# Patient Record
Sex: Female | Born: 1935 | Race: White | Hispanic: No | Smoking: Never smoker
Health system: Southern US, Community
[De-identification: ages and names within clinical notes are randomized; demographics above are authoritative.]

## PROBLEM LIST (undated history)

## (undated) DIAGNOSIS — C801 Malignant (primary) neoplasm, unspecified: Secondary | ICD-10-CM

## (undated) DIAGNOSIS — N183 Chronic kidney disease, stage 3 unspecified: Secondary | ICD-10-CM

## (undated) DIAGNOSIS — I779 Disorder of arteries and arterioles, unspecified: Secondary | ICD-10-CM

## (undated) DIAGNOSIS — M1710 Unilateral primary osteoarthritis, unspecified knee: Secondary | ICD-10-CM

## (undated) DIAGNOSIS — I2510 Atherosclerotic heart disease of native coronary artery without angina pectoris: Secondary | ICD-10-CM

## (undated) DIAGNOSIS — N289 Disorder of kidney and ureter, unspecified: Secondary | ICD-10-CM

## (undated) DIAGNOSIS — I739 Peripheral vascular disease, unspecified: Secondary | ICD-10-CM

## (undated) DIAGNOSIS — R0609 Other forms of dyspnea: Secondary | ICD-10-CM

## (undated) DIAGNOSIS — E079 Disorder of thyroid, unspecified: Secondary | ICD-10-CM

## (undated) DIAGNOSIS — K449 Diaphragmatic hernia without obstruction or gangrene: Secondary | ICD-10-CM

## (undated) DIAGNOSIS — K922 Gastrointestinal hemorrhage, unspecified: Secondary | ICD-10-CM

## (undated) DIAGNOSIS — M179 Osteoarthritis of knee, unspecified: Secondary | ICD-10-CM

## (undated) DIAGNOSIS — R32 Unspecified urinary incontinence: Secondary | ICD-10-CM

## (undated) DIAGNOSIS — N951 Menopausal and female climacteric states: Secondary | ICD-10-CM

## (undated) DIAGNOSIS — E611 Iron deficiency: Secondary | ICD-10-CM

## (undated) DIAGNOSIS — R0600 Dyspnea, unspecified: Secondary | ICD-10-CM

## (undated) DIAGNOSIS — I5032 Chronic diastolic (congestive) heart failure: Secondary | ICD-10-CM

## (undated) DIAGNOSIS — I10 Essential (primary) hypertension: Secondary | ICD-10-CM

## (undated) HISTORY — DX: Gastrointestinal hemorrhage, unspecified: K92.2

## (undated) HISTORY — DX: Iron deficiency: E61.1

## (undated) HISTORY — DX: Disorder of arteries and arterioles, unspecified: I77.9

## (undated) HISTORY — DX: Morbid (severe) obesity due to excess calories: E66.01

## (undated) HISTORY — DX: Peripheral vascular disease, unspecified: I73.9

## (undated) HISTORY — PX: REPLACEMENT TOTAL KNEE BILATERAL: SUR1225

## (undated) HISTORY — DX: Essential (primary) hypertension: I10

## (undated) HISTORY — DX: Unilateral primary osteoarthritis, unspecified knee: M17.10

## (undated) HISTORY — DX: Chronic diastolic (congestive) heart failure: I50.32

## (undated) HISTORY — PX: TOTAL VAGINAL HYSTERECTOMY: SHX2548

## (undated) HISTORY — DX: Menopausal and female climacteric states: N95.1

## (undated) HISTORY — DX: Unspecified urinary incontinence: R32

## (undated) HISTORY — DX: Osteoarthritis of knee, unspecified: M17.9

## (undated) HISTORY — DX: Other forms of dyspnea: R06.09

## (undated) HISTORY — DX: Dyspnea, unspecified: R06.00

## (undated) HISTORY — DX: Atherosclerotic heart disease of native coronary artery without angina pectoris: I25.10

## (undated) HISTORY — DX: Diaphragmatic hernia without obstruction or gangrene: K44.9

## (undated) HISTORY — DX: Disorder of thyroid, unspecified: E07.9

---

## 2004-11-08 ENCOUNTER — Ambulatory Visit: Payer: Self-pay | Admitting: Internal Medicine

## 2005-11-11 ENCOUNTER — Ambulatory Visit: Payer: Self-pay | Admitting: Internal Medicine

## 2005-12-08 ENCOUNTER — Ambulatory Visit: Payer: Self-pay | Admitting: Unknown Physician Specialty

## 2006-10-29 ENCOUNTER — Ambulatory Visit: Payer: Self-pay | Admitting: Internal Medicine

## 2007-07-15 ENCOUNTER — Ambulatory Visit: Payer: Self-pay | Admitting: Internal Medicine

## 2007-11-02 ENCOUNTER — Ambulatory Visit: Payer: Self-pay | Admitting: Internal Medicine

## 2008-11-03 ENCOUNTER — Ambulatory Visit: Payer: Self-pay | Admitting: Internal Medicine

## 2009-05-07 ENCOUNTER — Encounter: Payer: Self-pay | Admitting: Cardiovascular Disease

## 2009-05-07 LAB — CONVERTED CEMR LAB
HDL: 29.2 mg/dL
LDL Cholesterol: 80 mg/dL
Triglyceride fasting, serum: 194 mg/dL

## 2009-06-22 ENCOUNTER — Inpatient Hospital Stay: Payer: Medicare Other | Admitting: Internal Medicine

## 2009-07-12 ENCOUNTER — Encounter: Payer: Self-pay | Admitting: Cardiovascular Disease

## 2009-07-12 DIAGNOSIS — E039 Hypothyroidism, unspecified: Secondary | ICD-10-CM | POA: Insufficient documentation

## 2009-07-12 DIAGNOSIS — I10 Essential (primary) hypertension: Secondary | ICD-10-CM

## 2009-07-12 DIAGNOSIS — E032 Hypothyroidism due to medicaments and other exogenous substances: Secondary | ICD-10-CM

## 2009-07-18 ENCOUNTER — Ambulatory Visit: Payer: Self-pay | Admitting: Cardiovascular Disease

## 2009-07-18 DIAGNOSIS — I2510 Atherosclerotic heart disease of native coronary artery without angina pectoris: Secondary | ICD-10-CM

## 2009-07-18 DIAGNOSIS — E785 Hyperlipidemia, unspecified: Secondary | ICD-10-CM

## 2009-07-31 ENCOUNTER — Telehealth: Payer: Self-pay | Admitting: Cardiovascular Disease

## 2009-08-07 ENCOUNTER — Encounter: Payer: Self-pay | Admitting: Cardiovascular Disease

## 2009-08-16 ENCOUNTER — Encounter: Payer: Medicare Other | Admitting: Cardiovascular Disease

## 2009-08-24 ENCOUNTER — Encounter: Payer: Medicare Other | Admitting: Cardiovascular Disease

## 2009-08-29 ENCOUNTER — Telehealth: Payer: Self-pay | Admitting: Cardiovascular Disease

## 2009-09-24 ENCOUNTER — Encounter: Payer: Medicare Other | Admitting: Cardiovascular Disease

## 2009-10-01 ENCOUNTER — Encounter: Payer: Self-pay | Admitting: Cardiovascular Disease

## 2009-10-01 ENCOUNTER — Telehealth: Payer: Self-pay | Admitting: Cardiovascular Disease

## 2009-10-11 ENCOUNTER — Telehealth: Payer: Self-pay | Admitting: Cardiovascular Disease

## 2009-10-18 ENCOUNTER — Ambulatory Visit: Payer: Self-pay | Admitting: Cardiovascular Disease

## 2009-10-22 ENCOUNTER — Encounter: Payer: Self-pay | Admitting: Cardiovascular Disease

## 2009-10-24 ENCOUNTER — Telehealth: Payer: Self-pay | Admitting: Cardiovascular Disease

## 2009-10-25 ENCOUNTER — Ambulatory Visit: Payer: Medicare Other | Admitting: Internal Medicine

## 2009-11-01 LAB — CONVERTED CEMR LAB
Bilirubin, Direct: 0.1 mg/dL (ref 0.0–0.3)
Cholesterol: 147 mg/dL (ref 0–200)
Indirect Bilirubin: 0.2 mg/dL (ref 0.0–0.9)
LDL Cholesterol: 80 mg/dL (ref 0–99)
Total Bilirubin: 0.3 mg/dL (ref 0.3–1.2)
Total CHOL/HDL Ratio: 3.7
VLDL: 27 mg/dL (ref 0–40)

## 2009-11-08 ENCOUNTER — Encounter: Payer: Self-pay | Admitting: Cardiovascular Disease

## 2009-11-09 ENCOUNTER — Encounter: Payer: Self-pay | Admitting: Cardiovascular Disease

## 2009-11-12 ENCOUNTER — Ambulatory Visit: Payer: Medicare Other | Admitting: Internal Medicine

## 2009-11-16 ENCOUNTER — Telehealth: Payer: Self-pay | Admitting: Cardiovascular Disease

## 2009-11-19 ENCOUNTER — Ambulatory Visit: Payer: Medicare Other | Admitting: Internal Medicine

## 2009-11-24 ENCOUNTER — Ambulatory Visit: Payer: Medicare Other | Admitting: Internal Medicine

## 2009-12-11 ENCOUNTER — Telehealth: Payer: Self-pay | Admitting: Cardiovascular Disease

## 2009-12-25 ENCOUNTER — Ambulatory Visit: Payer: Medicare Other | Admitting: Internal Medicine

## 2009-12-28 ENCOUNTER — Telehealth: Payer: Self-pay | Admitting: Cardiovascular Disease

## 2010-01-09 ENCOUNTER — Telehealth: Payer: Self-pay | Admitting: Cardiovascular Disease

## 2010-01-21 ENCOUNTER — Ambulatory Visit: Payer: Self-pay | Admitting: Cardiovascular Disease

## 2010-01-21 DIAGNOSIS — R0602 Shortness of breath: Secondary | ICD-10-CM

## 2010-01-24 ENCOUNTER — Ambulatory Visit: Payer: Medicare Other | Admitting: Internal Medicine

## 2010-02-01 ENCOUNTER — Telehealth: Payer: Self-pay | Admitting: Cardiovascular Disease

## 2010-02-04 ENCOUNTER — Telehealth: Payer: Self-pay | Admitting: Cardiovascular Disease

## 2010-02-05 ENCOUNTER — Encounter: Payer: Self-pay | Admitting: Cardiovascular Disease

## 2010-02-12 ENCOUNTER — Encounter: Payer: Self-pay | Admitting: Cardiovascular Disease

## 2010-02-24 ENCOUNTER — Ambulatory Visit: Payer: Medicare Other | Admitting: Internal Medicine

## 2010-03-08 ENCOUNTER — Encounter: Payer: Self-pay | Admitting: Cardiovascular Disease

## 2010-03-26 NOTE — Progress Notes (Signed)
Summary: PLAVIX  Phone Note Call from Patient Call back at Home Phone [phone]   Caller: SELF Call For: GOLLAN Summary of Call: PT REQUSTING PLAVIX-I STATED THAT WE DO NOT HAVE ANY-PT WAS NOT HAPPY AND REPLIED THAT WE HAVE NOT GIVEN HER ANY PLAVIX FOR 4 WEEKS-I TOLD HER THAT WE HAVE ALOT OF PTS THAT USE PLAVIX AND THAT IT IS DIFFICULT TO KEEP IT IN STOCK AND TOLD HER THAT SHE WOULD MOST LIKELY NEED TO START GETTING IT REFILLED-SHE STATED THAT DR GOLLAN TOLD HER THAT WE WOULD KEEP HER ON IT WITH SUPPLIES-SHE WANTED TO KNOW MY NAME AND SAID THAT SHE IS KEEPING A LIST OF EVERYONE HERE THAT TELLS HER THAT WE ARE OUT OF PLAVIX AND THAT SHE IS COMING FOR AN APPT WITH GOLLAN ON MONDAY AND WILL BE TALKING TO HIM ABOUT IT. Initial call taken by: Zenovia Jarred,  January 09, 2010 3:43 PM  Follow-up for Phone Call        Plavix only available in very limited amounts. We could probably get a free month of effient when coupons are available.

## 2010-03-26 NOTE — Miscellaneous (Signed)
Summary: Heart Track Order  Heart Track Order   Imported By: Zenovia Jarred 07/19/2009 12:50:10  _____________________________________________________________________  External Attachment:    Type:   Image     Comment:   External Document

## 2010-03-26 NOTE — Miscellaneous (Signed)
Summary: HEART TRACK  HEART TRACK   Imported By: Cathie Beams [place] 08/07/2009 13:36:54  _____________________________________________________________________  External Attachment:    Type:   Image     Comment:   External Document

## 2010-03-26 NOTE — Progress Notes (Signed)
Summary: SAMPLES given of Plavix  Phone Note Call from Patient Call back at [phone]   Summary of Call: Patient would like samples of Plavix. Please call if we have any. [id number] Initial call taken by: Shanda Howells,  August 29, 2009 11:51 AM  Follow-up for Phone Call        Samples given of Plavix 75 mg for 8 tabs. Told pt. to pick up. Follow-up by: Dolores Lory, CMA,  August 29, 2009 12:06 PM

## 2010-03-26 NOTE — Progress Notes (Signed)
Summary: refill plavix  Phone Note Call from Patient   Caller: Patient Summary of Call: Needs a refill on Plavix 75 mg.  Initial call taken by: Dolores Lory, CMA,  November 16, 2009 2:40 PM  Follow-up for Phone Call        Phone Call Completed  samples given x 3 weeks supply. Follow-up by: Dolores Lory, [place],  November 16, 2009 2:40 PM

## 2010-03-26 NOTE — Assessment & Plan Note (Signed)
Summary: F6M/AMD   Visit Type:  Follow-up Primary Provider:  Dr Mable Fill  CC:  c/o SOB. denies chest pain or palpitations..  History of Present Illness: Amy [name] is a patient of Dr. Mable Fill with a history of coronary artery disease, recent positive stress test which was performed for shortness of breath and diabetes, leading to a cardiac catheterization confirming severe stenosis of her proximal LAD with stent placed by Dr. Clayborn Bigness, who now presents to for routine follow up.   overall she is doing well. She has had difficulty obtaining Plavix it is expensive. She pace $60 a month and is currently in the donut hole. otherwise she is sedentary, does not walk. She has 2 bad knees with history of knee replacement. She has shortness of breath with exertion. No chest pain.. she does have a history of secondhand smoke exposure  cardiac catheter performed at [facility] on June 22, 2009 details 90% proximal LAD, 40% mid LAD, 40% diagonal, 60% proximal circumflex followed by 40% circumflex lesion, 30%, 40% and 30% lesion noted in the RCA with distal RCA with 30% and 25% lesions. Mild atheroma in the aorta.  Xience 2.75 x 12 mm Drug eluting stent placed.   EKG shows normal sinus rhythm with right bundle branch block, rate 83 beats a minute no significant ST-T wave changes   Current Medications (verified): 1)  Metformin Hcl 850 Mg Tabs (Metformin Hcl) .... Two Times A Day 2)  Plavix 75 Mg Tabs (Clopidogrel Bisulfate) .... Take One Tablet By Mouth Daily 3)  Oxybutynin Chloride 5 Mg Tabs (Oxybutynin Chloride) .... Once Daily 4)  Simvastatin 20 Mg Tabs (Simvastatin) .... Take 1/2  Tablet By Mouth Once A Day 5)  Levothroid 100 Mcg Tabs (Levothyroxine Sodium) .... Once Daily 6)  Glimepiride 2 Mg Tabs (Glimepiride) .... Once Daily 7)  Losartan Potassium-Hctz 50-12.5 Mg Tabs (Losartan Potassium-Hctz) .... Once Daily 8)  Amlodipine Besylate 2.5 Mg Tabs (Amlodipine Besylate) .... Take One Tablet By Mouth  Daily 9)  Aspirin Ec 325 Mg Tbec (Aspirin) .... Take One Tablet By Mouth Daily 10)  Detrol 2 Mg Tabs (Tolterodine Tartrate) .Marland Kitchen.. 1 Tablet Once Daily 11)  Diovan Hct 160-12.5 Mg Tabs (Valsartan-Hydrochlorothiazide) .Marland Kitchen.. 1 Tablet Once Daily  Allergies (verified): 1)  ! * Narcotics  Past History:  Past Medical History: Last updated: 07/12/2009 Diabetes Type 2 Hypertension Hypothyroidism Hiatal hernia Menopausal symptoms Bladder incontinenece Degenerative arthritis of knees Glaucoma  Past Surgical History: Last updated: 07/12/2009 Bilateral knee replacement iwht transfusions Hysterectomy for ovarian mass, not cancerous  Family History: Last updated: 07/18/2009 Family History of Coronary Artery Disease:   Social History: Last updated: 07/18/2009 Retired  Widowed  Tobacco Use - No.  Alcohol Use - no Drug Use - no  Risk Factors: Smoking Status: never (07/18/2009)  Review of Systems       The patient complains of dyspnea on exertion.  The patient denies fever, weight loss, weight gain, vision loss, decreased hearing, hoarseness, chest pain, syncope, peripheral edema, prolonged cough, abdominal pain, incontinence, muscle weakness, depression, and enlarged lymph nodes.    Vital Signs:  Patient profile:   77 year old female Height:      62 inches Weight:      234.50 pounds BMI:     43.05 Pulse rate:   83 / minute BP sitting:   182 / 72  (left arm) Cuff size:   large  Vitals Entered By: Rodman Comp CMA (January 21, 2010 10:28 AM)  Physical Exam  General:  Well developed, well nourished, in no acute distress. obese Head:  normocephalic and atraumatic Neck:  Neck supple, no JVD. No masses, thyromegaly or abnormal cervical nodes. Lungs:  Clear bilaterally to auscultation and percussion. Heart:  Non-displaced PMI, chest non-tender; regular rate and rhythm, S1, S2 without murmurs, rubs or gallops. Carotid upstroke normal, no bruit.  Pedals normal pulses. No edema, no  varicosities. Abdomen:  Bowel sounds positive; abdomen soft and non-tender without masses Msk:  Back normal, normal gait. Muscle strength and tone normal. Pulses:  pulses normal in all 4 extremities Extremities:  No clubbing or cyanosis. Neurologic:  Alert and oriented x 3. Skin:  Intact without lesions or rashes. Psych:  Normal affect.   Impression & Recommendations:  Problem # 1:  CAD, NATIVE VESSEL (ICD-414.01) no symptoms of angina at this time. No further testing. We'll continue aggressive medical management  Her updated medication list for this problem includes:    Plavix 75 Mg Tabs (Clopidogrel bisulfate) .Marland Kitchen... Take one tablet by mouth daily    Amlodipine Besylate 2.5 Mg Tabs (Amlodipine besylate) .Marland Kitchen... Take one tablet by mouth daily    Aspirin Ec 325 Mg Tbec (Aspirin) .Marland Kitchen... Take one tablet by mouth daily  Problem # 2:  HYPERLIPIDEMIA-MIXED (ICD-272.4) we have recommended that she increase her simvastatin to 20 mg daily. Her LDL is above goal. Goal for her is less than 70, currently she is 80  Problem # 3:  HYPERTENSION, BENIGN (ICD-401.1) blood pressure is Poorly controlled on today's visit. We'll restart atenolol 12.5 mg daily. If her blood pressure continues to be elevated, we can increase her amlodipine or losartan. I have asked her to contact me within a week with me with new numbers from home  Her updated medication list for this problem includes:    Losartan Potassium-hctz 50-12.5 Mg Tabs (Losartan potassium-hctz) ..... Once daily    Amlodipine Besylate 2.5 Mg Tabs (Amlodipine besylate) .Marland Kitchen... Take one tablet by mouth daily    Aspirin Ec 325 Mg Tbec (Aspirin) .Marland Kitchen... Take one tablet by mouth daily  Problem # 4:  DYSPNEA (ICD-786.05) I suspect that her shortness of breath is secondary to overweight, deconditioned. Encouraged to increase her exercise. Her heart rate is also borderline elevated. I asked her to restart her atenolol 12.5 mg daily  Her updated medication list  for this problem includes:    Losartan Potassium-hctz 50-12.5 Mg Tabs (Losartan potassium-hctz) ..... Once daily    Amlodipine Besylate 2.5 Mg Tabs (Amlodipine besylate) .Marland Kitchen... Take one tablet by mouth daily    Aspirin Ec 325 Mg Tbec (Aspirin) .Marland Kitchen... Take one tablet by mouth daily    Diovan Hct 160-12.5 Mg Tabs (Valsartan-hydrochlorothiazide) .Marland Kitchen... 1 tablet once daily  Patient Instructions: 1)  Your physician recommends that you schedule a follow-up appointment in: 6 months. 2)  Your physician recommends that you continue on your current medications as directed. Please refer to the Current Medication list given to you today.

## 2010-03-26 NOTE — Miscellaneous (Signed)
Summary: Discharge  Discharge   Imported By: Zenovia Jarred 10/26/2009 11:33:25  _____________________________________________________________________  External Attachment:    Type:   Image     Comment:   External Document

## 2010-03-26 NOTE — Progress Notes (Signed)
Summary: SAMPLES  Phone Note Call from Patient Call back at [phone]   Caller: SELF Call For:  [facility] Summary of Call: WOULD LIKE PLAVIX SAMPLES Initial call taken by: Zenovia Jarred,  December 28, 2009 4:11 PM  Follow-up for Phone Call        No samples available; patient notified. Follow-up by: Dolores Lory, CMA,  December 28, 2009 4:38 PM

## 2010-03-26 NOTE — Letter (Signed)
Summary: PHI  PHI   Imported By: Zenovia Jarred 07/19/2009 12:49:24  _____________________________________________________________________  External Attachment:    Type:   Image     Comment:   External Document

## 2010-03-26 NOTE — Progress Notes (Signed)
Summary: SAMPLES  Phone Note Call from Patient Call back at [phone]   Caller: SELF Call For: [facility] Summary of Call: [place] Initial call taken by: Zenovia Jarred,  December 11, 2009 3:10 PM  Follow-up for Phone Call        [facility] to come pick up sample of plavix 75mg  (4 tablets). Follow-up by: Dolores Lory, [place],  December 11, 2009 4:02 PM

## 2010-03-26 NOTE — Progress Notes (Signed)
Summary: Heart Track  Phone Note From Other Clinic   Caller: Heart Track Call For: Gollan Summary of Call: Nurse from Heart track called pt seen there today. HR stayed in [id number] with excertion.  Reports feeling fatigued, nurse will send EKG.  Initial call taken by: Cordelia Pen, RN,  October 01, 2009 12:28 PM  Follow-up for Phone Call        Print out from Heart Track given to Dr. Rockey Situ. Cordelia Pen, RN  October 01, 2009 2:30 PM   Additional Follow-up for Phone Call Additional follow up Details #1::        Would confirm that she is taking atenolol and at what dose. If she is taking a 12.5 tab, would hold it and monitor pulse at home.     Appended Document: Heart Track LMOM TCB  Appended Document: Heart Track pt will stop taking atenolol and monitor BP and call us if BP is >140/90  Appended Document: Heart Track    Clinical Lists Changes  Medications: Removed medication of ATENOLOL 25 MG TABS (ATENOLOL) Take 1/2 tablet daily

## 2010-03-26 NOTE — Miscellaneous (Signed)
Summary: Heart Track  Heart Track   Imported By: Zenovia Jarred 10/03/2009 14:45:49  _____________________________________________________________________  External Attachment:    Type:   Image     Comment:   External Document

## 2010-03-26 NOTE — Progress Notes (Signed)
Summary: SAMPLES plavix  Phone Note Call from Patient Call back at [phone]   Caller: SELF Call For: [facility] Summary of Call: [place] Initial call taken by: Zenovia Jarred,  October 11, 2009 12:00 PM  Follow-up for Phone Call        Phone Call Completed:  LMOM samples of plavix available to be picked up. Follow-up by: Dolores Lory, [place],  October 11, 2009 12:07 PM

## 2010-03-26 NOTE — Progress Notes (Signed)
Summary: SAMPLES plavix  Phone Note Call from Patient Call back at [phone]   Caller: SELF Call For: [facility] Summary of Call: [place] Initial call taken by: Zenovia Jarred,  October 24, 2009 2:37 PM  Follow-up for Phone Call        [facility] to pick up sample of plavix Follow-up by: Dolores Lory, [place],  October 24, 2009 2:43 PM

## 2010-03-26 NOTE — Progress Notes (Signed)
Summary: Heart Rate  Phone Note Call from Patient Call back at Home Phone [phone]   Caller: Patient Call For: Mahonri Seiden Summary of Call: Patient was to call back and give her past couple of weeks heart rate: 60,100,71,73,63,59,66,69,68,and 55.  Patient also would like to see if she can get samples of Plavix. Initial call taken by: Shanda Howells,  July 31, 2009 3:07 PM  Follow-up for Phone Call        Preliminarily reviewed. Forwarded to MD desktop for review and signature  Please review HR and Advise Follow-up by: Cordelia Pen, RN,  July 31, 2009 5:06 PM    Additional Follow-up for Phone Call Additional follow up Details #2::    would continue on low dose atenolol.   Appended Document: Heart Rate pt aware of dr Rockey Situ recommendations

## 2010-03-26 NOTE — Assessment & Plan Note (Signed)
Summary: NP6/AMD   Visit Type:  Initial Consult Primary Provider:  Dr Mable Fill  CC:  wants a cardiologist for herself.  History of Present Illness: Amy [name] is a patient of Dr. Mable Fill with a history of coronary artery disease, recent positive stress test which was performed for shortness of breath and diabetes, leading to a cardiac catheterization confirming severe stenosis of her proximal LAD with stent placed by Dr. Clayborn Bigness, who now presents to establish care.  Overall she states that she feels very well since the stent was placed. She's had no complications. She states that her breathing has significantly improved. She would like to participate in cardiac rehabilitation. She reports that her atenolol was discontinued in the hospital due to bradycardia with heart rates in the 20s to 30s. This was in the perioperative period following her stent placement. She does comment on it prior to the Plavix but would appear that she had a Xience 2.75 x 12 mm Drug eluting stent placed.   cardiac catheter performed at [facility] on June 22, 2009 details 90% proximal LAD, 40% mid LAD, 40% diagonal, 60% proximal circumflex followed by 40% circumflex lesion, 30%, 40% and 30% lesion noted in the RCA with distal RCA with 30% and 25% lesions. Mild atheroma in the aorta.    Preventive Screening-Counseling & Management  Alcohol-Tobacco     Smoking Status: never      Drug Use:  no.    Current Medications (verified): 1)  Metformin Hcl 850 Mg Tabs (Metformin Hcl) .... Two Times A Day 2)  Plavix 75 Mg Tabs (Clopidogrel Bisulfate) .... Take One Tablet By Mouth Daily 3)  Oxybutynin Chloride 5 Mg Tabs (Oxybutynin Chloride) .... Once Daily 4)  Protonix 40 Mg Tbec (Pantoprazole Sodium) .... Once Daily 5)  Simvastatin 20 Mg Tabs (Simvastatin) .... Take One Tablet By Mouth Daily At Bedtime 6)  Levothroid 100 Mcg Tabs (Levothyroxine Sodium) .... Once Daily 7)  Glimepiride 2 Mg Tabs (Glimepiride) .... Once  Daily 8)  Losartan Potassium-Hctz 50-12.5 Mg Tabs (Losartan Potassium-Hctz) .... Once Daily 9)  Amlodipine Besylate 2.5 Mg Tabs (Amlodipine Besylate) .... Take One Tablet By Mouth Daily 10)  Aspirin Ec 325 Mg Tbec (Aspirin) .... Take One Tablet By Mouth Daily  Allergies (verified): 1)  ! * Narcotics  Family History: Family History of Coronary Artery Disease:   Social History: Retired  Widowed  Tobacco Use - No.  Alcohol Use - no Drug Use - no Smoking Status:  never Drug Use:  no  Review of Systems  The patient denies fever, weight loss, weight gain, vision loss, decreased hearing, hoarseness, chest pain, syncope, dyspnea on exertion, peripheral edema, prolonged cough, abdominal pain, incontinence, muscle weakness, depression, and enlarged lymph nodes.    Vital Signs:  Patient profile:   77 year old female Height:      62 inches Weight:      224 pounds BMI:     41.12 Pulse rate:   94 / minute BP sitting:   160 / 70  (left arm) Cuff size:   regular  Vitals Entered By: Mignon Pine, RMA (Jul 18, 2009 2:27 PM)  Physical Exam  General:  Well developed, well nourished, in no acute distress. Head:  normocephalic and atraumatic Neck:  Neck supple, no JVD. No masses, thyromegaly or abnormal cervical nodes. Lungs:  Clear bilaterally to auscultation and percussion. Heart:  Non-displaced PMI, chest non-tender; regular rate and rhythm, S1, S2 without murmurs, rubs or gallops. Carotid upstroke normal, no bruit. Normal  abdominal aortic size, no bruits. Femorals normal pulses, no bruits. Pedals normal pulses. No edema, no varicosities. Abdomen:  Bowel sounds positive; abdomen soft and non-tender without masses, organomegaly, or hernias noted. No hepatosplenomegaly. Msk:  Back normal, normal gait. Muscle strength and tone normal. Pulses:  pulses normal in all 4 extremities Extremities:  No clubbing or cyanosis. Neurologic:  Alert and oriented x 3. Skin:  Intact without lesions or  rashes. Psych:  Normal affect.    EKG  Procedure date:  07/18/2009  Findings:      Normal sinus rhythm with right bundle branch block, rate 94 beats per minute  Impression & Recommendations:  Problem # 1:  CAD, NATIVE VESSEL (ICD-414.01) history of coronary artery disease with recent DES stent placed to her proximal LAD for 90% lesion. She has felt well with increased stamina and less shortness of breath.  We have recommended that she restart her atenolol 12.5 mg daily given her heart rate is elevated in the 90s and her blood pressure is also elevated. Asked her to contact me with her heart rates and blood pressures over the next week or 2. I hope that we will have room to advance her atenolol to 25 mg daily if she has no bradycardia. If her blood pressure stays elevated, we could increase her amlodipine.  We will send a request for her to participate in cardiac rehabilitation at [facility]. We have given her several weeks of Plavix samples as she finds this very expensive.  Her updated medication list for this problem includes:    Plavix 75 Mg Tabs (Clopidogrel bisulfate) .Marland Kitchen... Take one tablet by mouth daily    Amlodipine Besylate 2.5 Mg Tabs (Amlodipine besylate) .Marland Kitchen... Take one tablet by mouth daily    Aspirin Ec 325 Mg Tbec (Aspirin) .Marland Kitchen... Take one tablet by mouth daily    Atenolol 25 Mg Tabs (Atenolol) .Marland Kitchen... Take 1/2 tablet daily  Problem # 2:  HYPERTENSION, BENIGN (ICD-401.1) Blood pressure is elevated today and we will restart her on low-dose atenolol with careful monitoring of her pressure appeared  Her updated medication list for this problem includes:    Losartan Potassium-hctz 50-12.5 Mg Tabs (Losartan potassium-hctz) ..... Once daily    Amlodipine Besylate 2.5 Mg Tabs (Amlodipine besylate) .Marland Kitchen... Take one tablet by mouth daily    Aspirin Ec 325 Mg Tbec (Aspirin) .Marland Kitchen... Take one tablet by mouth daily    Atenolol 25 Mg Tabs (Atenolol) .Marland Kitchen... Take 1/2  tablet daily  Problem # 3:  HYPERLIPIDEMIA-MIXED (ICD-272.4) Her cholesterol is not quite at goal as her LDL should be less than 70. I am delighted that she stopped smoking some time ago. We have suggested that she increase her simvastatin to 40 mg daily with a recheck of her cholesterol in 3 months time. If her cholesterol continues to be greater than goal, we could make an adjustment at that time.  Her updated medication list for this problem includes:    Simvastatin 80 Mg Tabs (Simvastatin) .Marland Kitchen... Take 1 tablet by mouth once a day  Patient Instructions: 1)  Your physician recommends that you return for a FASTING lipid profile: in 3 months (lipid/liver) 2)  Your physician has recommended you make the following change in your medication: Start taking Atenolol 12.5mg  daily (1/2 tablet).  Start taking Simvastatin 40mg  once daily (1/2 80mg  tablet) 3)  Your physician recommends referral and attendance at a Cardiac Rehab Program. Referral to [facility]. Prescriptions: SIMVASTATIN 80 MG TABS (SIMVASTATIN) Take  1 tablet by mouth once a day  #30 x 6   Entered by:   Freddrick March RN   Authorized by:   Esmond Plants MD   Signed by:   Esmond Plants MD on 07/18/2009   Method used:   Electronically to        [facility] (retail)       [address]       [place], [place]  [id number]       Ph: [phone]       Fax: [phone]   RxID:   [id number]

## 2010-03-27 ENCOUNTER — Ambulatory Visit: Payer: Medicare Other | Admitting: Internal Medicine

## 2010-03-28 NOTE — Progress Notes (Signed)
Summary: Medication questions  Phone Note Call from Patient Call back at [phone]   Caller: Self Call For: [facility] Summary of Call: Pt would like samples of Plavix.  Pt understood to take 1 whole pill of Glymeride and 1/2 of a pill of Atenolol.  The medication that was called in was not enough. Initial call taken by: Zenovia Jarred,  February 04, 2010 10:59 AM  Follow-up for Phone Call        Notified patient needs to take Atenolol 12.5 mg one tablet once daily.  Told her need to contact her PCP regarding the Glimiperide. Gave samples of Plavix 75 mg two boxes. Follow-up by: Dolores Lory, CMA,  February 04, 2010 12:23 PM

## 2010-03-28 NOTE — Progress Notes (Signed)
  Phone Note Call from Patient   Caller: Patient Summary of Call: Patient was told to call the office to let us know how she is doing.  Mrs. Amy [name] states "doing well". Initial call taken by: Dolores Lory, [place],  February 01, 2010 8:43 AM

## 2010-03-28 NOTE — Miscellaneous (Signed)
Summary: Plavix rx  Clinical Lists Changes  Medications: Changed medication from PLAVIX 75 MG TABS (CLOPIDOGREL BISULFATE) Take one tablet by mouth daily to PLAVIX 75 MG TABS (CLOPIDOGREL BISULFATE) Take one tablet by mouth daily - Signed Rx of PLAVIX 75 MG TABS (CLOPIDOGREL BISULFATE) Take one tablet by mouth daily;  #14 x 0;  Signed;  Entered by: Darlyne Russian RN;  Authorized by: Esmond Plants MD;  Method used: Print then Give to Patient    Prescriptions: PLAVIX 75 MG TABS (CLOPIDOGREL BISULFATE) Take one tablet by mouth daily  #14 x 0   Entered by:   Darlyne Russian RN   Authorized by:   Esmond Plants MD   Signed by:   Darlyne Russian RN on 03/08/2010   Method used:   Print then Give to Patient   RxID:   [id number]

## 2010-03-28 NOTE — Letter (Signed)
Summary: [facility]   Imported By: Marilynne Drivers 03/07/2010 10:11:38  _____________________________________________________________________  External Attachment:    Type:   Image     Comment:   External Document

## 2010-04-01 ENCOUNTER — Telehealth: Payer: Self-pay | Admitting: Cardiovascular Disease

## 2010-04-11 NOTE — Progress Notes (Signed)
Summary: Plavix  Phone Note Call from Patient Call back at [phone]   Caller: Patient Call For: Amy [name] Summary of Call: Pt called and wanted to know if it would be okay to order Plavix from [place]. She keeps getting mail for this and wanted to know if this is okay? Initial call taken by: Rodman Comp CMA,  April 01, 2010 10:35 AM  Follow-up for Phone Call        pt notified that it is not FDA approved and Dr. Rockey Situ recommends that she not get Plavix from [place].  Follow-up by: Rodman Comp CMA,  April 01, 2010 2:13 PM

## 2010-04-25 ENCOUNTER — Ambulatory Visit: Payer: Medicare Other | Admitting: Internal Medicine

## 2010-05-23 ENCOUNTER — Telehealth: Payer: Self-pay | Admitting: Cardiovascular Disease

## 2010-05-23 NOTE — Telephone Encounter (Signed)
Pt would like a call back from [place].  Would not disclose what it is in regards to.

## 2010-05-26 ENCOUNTER — Ambulatory Visit: Payer: Medicare Other | Admitting: Internal Medicine

## 2010-05-27 NOTE — Telephone Encounter (Signed)
Pt is asking how long you will want her to continue Plavix. Pt is unsure exactly when she started, and she will run out 1 week short of her f/u with Dr. Rockey Situ 06/24/10 and would like to know if she needs to continue before paying for new rx. Please advise.

## 2010-05-29 NOTE — Telephone Encounter (Signed)
Stent placed 06/22/2009. Would like to continue her on plavix. Generic mid May. Need one more month. Perhaps we have effient samples?

## 2010-05-30 NOTE — Telephone Encounter (Signed)
Attempted to contact pt, LMOM TCB.  

## 2010-06-03 NOTE — Telephone Encounter (Signed)
Attempted to call pt back x 2, LMOM TCB.

## 2010-06-24 ENCOUNTER — Ambulatory Visit (INDEPENDENT_AMBULATORY_CARE_PROVIDER_SITE_OTHER): Payer: Medicare Other | Admitting: Cardiovascular Disease

## 2010-06-24 ENCOUNTER — Encounter: Payer: Self-pay | Admitting: Cardiovascular Disease

## 2010-06-24 DIAGNOSIS — I2510 Atherosclerotic heart disease of native coronary artery without angina pectoris: Secondary | ICD-10-CM

## 2010-06-24 DIAGNOSIS — R0602 Shortness of breath: Secondary | ICD-10-CM

## 2010-06-24 DIAGNOSIS — I10 Essential (primary) hypertension: Secondary | ICD-10-CM

## 2010-06-24 DIAGNOSIS — E785 Hyperlipidemia, unspecified: Secondary | ICD-10-CM

## 2010-06-24 NOTE — Assessment & Plan Note (Addendum)
Blood pressure is well controlled on today's visit. She does have bradycardia and we will decrease her atenolol to 12.5 mg daily

## 2010-06-24 NOTE — Assessment & Plan Note (Signed)
LDL was slightly above goal. We have suggested that she continue to work on her diet and exercise

## 2010-06-24 NOTE — Patient Instructions (Signed)
You are doing well. No medication changes were made. Please call us if you have new issues that need to be addressed before your next appt.  We will call you for a follow up Appt. In 6 months  

## 2010-06-24 NOTE — Progress Notes (Signed)
   Patient ID: Amy [name], female    DOB: 1927-12-06, 77 y.o.   MRN: [id number]  HPI Comments: Ms. Ozier is a patient of Dr. Mable Fill with a history of coronary artery disease, recent positive stress test which was performed for shortness of breath and diabetes, leading to a cardiac catheterization confirming severe stenosis of her proximal LAD with stent placed by Dr. Clayborn Bigness, who now presents to for routine follow up.     She reports that overall she has been doing well. She did have a very brief episode of chest discomfort for several minutes while she was sitting. This does not happen frequently, only on a rare occasion. She does not want nitroglycerin. She does not want any further workup at this time.  She does have shortness of breath and her weight has been difficult to manage. She is not very active.   cardiac catheter performed at [facility] on June 22, 2009 details 90% proximal LAD, 40% mid LAD, 40% diagonal, 60% proximal circumflex followed by 40% circumflex lesion, 30%, 40% and 30% lesion noted in the RCA with distal RCA with 30% and 25% lesions. Mild atheroma in the aorta.  Xience 2.75 x 12 mm Drug eluting stent placed.     EKG shows normal sinus rhythm with right bundle branch block, rate 52 beats a minute no significant ST-T wave changes        Review of Systems  Constitutional: Negative.   HENT: Negative.   Eyes: Negative.   Respiratory: Positive for shortness of breath.   Cardiovascular: Positive for chest pain.  Gastrointestinal: Negative.   Musculoskeletal: Negative.   Skin: Negative.   Neurological: Negative.   Hematological: Negative.   Psychiatric/Behavioral: Negative.   All other systems reviewed and are negative.   BP 120/58  Pulse 52  Ht 5\' 2"  (1.575 m)  Wt 241 lb (109.317 kg)  BMI 44.08 kg/m2   Physical Exam  Nursing note and vitals reviewed. Constitutional: She is oriented to person, place, and time. She appears well-developed and  well-nourished.       Obese  HENT:  Head: Normocephalic.  Nose: Nose normal.  Mouth/Throat: Oropharynx is clear and moist.  Eyes: Conjunctivae are normal. Pupils are equal, round, and reactive to light.  Neck: Normal range of motion. Neck supple. No JVD present.  Cardiovascular: Normal rate, regular rhythm, normal heart sounds and intact distal pulses.  Exam reveals no gallop and no friction rub.   No murmur heard. Pulmonary/Chest: Effort normal and breath sounds normal. No respiratory distress. She has no wheezes. She has no rales. She exhibits no tenderness.  Abdominal: Soft. Bowel sounds are normal. She exhibits no distension. There is no tenderness.  Musculoskeletal: Normal range of motion. She exhibits no edema and no tenderness.  Lymphadenopathy:    She has no cervical adenopathy.  Neurological: She is alert and oriented to person, place, and time. Coordination normal.  Skin: Skin is warm and dry. No rash noted. No erythema.  Psychiatric: She has a normal mood and affect. Her behavior is normal. Judgment and thought content normal.         Assessment and Plan

## 2010-06-24 NOTE — Assessment & Plan Note (Signed)
I suspect her shortness of breath is secondary to her obesity and deconditioned state. It is stable and has not been getting worse. I've asked her to work on her weight and her exercise.

## 2010-06-24 NOTE — Assessment & Plan Note (Signed)
She did have a brief episode of chest discomfort. She is not interested in nitroglycerin or further workup at this time. We have suggested that she call us if she has any worsening chest pain symptoms.

## 2010-06-25 ENCOUNTER — Other Ambulatory Visit (INDEPENDENT_AMBULATORY_CARE_PROVIDER_SITE_OTHER): Payer: Medicare Other | Admitting: *Deleted

## 2010-06-25 ENCOUNTER — Ambulatory Visit: Payer: Medicare Other | Admitting: Internal Medicine

## 2010-06-25 ENCOUNTER — Ambulatory Visit: Payer: Self-pay | Admitting: Cardiovascular Disease

## 2010-06-25 DIAGNOSIS — R0989 Other specified symptoms and signs involving the circulatory and respiratory systems: Secondary | ICD-10-CM

## 2010-06-25 DIAGNOSIS — E785 Hyperlipidemia, unspecified: Secondary | ICD-10-CM

## 2010-06-26 ENCOUNTER — Encounter: Payer: Self-pay | Admitting: Cardiovascular Disease

## 2010-06-26 LAB — HEPATIC FUNCTION PANEL
AST: 14 U/L (ref 0–37)
Albumin: 4.2 g/dL (ref 3.5–5.2)
Alkaline Phosphatase: 75 U/L (ref 39–117)
Bilirubin, Direct: 0.1 mg/dL (ref 0.0–0.3)
Total Bilirubin: 0.4 mg/dL (ref 0.3–1.2)

## 2010-06-26 LAB — LIPID PANEL: HDL: 35 mg/dL — ABNORMAL LOW (ref >39)

## 2010-07-11 ENCOUNTER — Telehealth: Payer: Self-pay | Admitting: *Deleted

## 2010-07-11 NOTE — Telephone Encounter (Signed)
Opened in error

## 2010-07-16 ENCOUNTER — Other Ambulatory Visit: Payer: Self-pay | Admitting: Cardiovascular Disease

## 2010-07-16 MED ORDER — CLOPIDOGREL BISULFATE 75 MG PO TABS: 75.0000 mg | ORAL_TABLET | Freq: Every day | ORAL | Status: DC

## 2010-07-19 ENCOUNTER — Other Ambulatory Visit: Payer: Self-pay | Admitting: Emergency Medicine

## 2010-07-19 MED ORDER — CLOPIDOGREL BISULFATE 75 MG PO TABS: 75.0000 mg | ORAL_TABLET | Freq: Every day | ORAL | Status: DC

## 2010-09-11 ENCOUNTER — Telehealth: Payer: Self-pay

## 2010-09-11 MED ORDER — SIMVASTATIN 40 MG PO TABS: 40.0000 mg | ORAL_TABLET | Freq: Every evening | ORAL | Status: DC

## 2010-09-11 NOTE — Telephone Encounter (Signed)
The patient is taking simvastatin 40 mg daily.

## 2010-10-17 ENCOUNTER — Ambulatory Visit: Payer: Medicare Other | Admitting: Internal Medicine

## 2010-10-26 ENCOUNTER — Ambulatory Visit: Payer: Medicare Other | Admitting: Internal Medicine

## 2010-11-18 ENCOUNTER — Ambulatory Visit: Payer: Medicare Other | Admitting: Internal Medicine

## 2010-12-13 ENCOUNTER — Encounter: Payer: Self-pay | Admitting: Cardiovascular Disease

## 2010-12-23 ENCOUNTER — Ambulatory Visit: Payer: Medicare Other | Admitting: Cardiovascular Disease

## 2010-12-25 ENCOUNTER — Ambulatory Visit: Payer: Medicare Other | Admitting: Cardiovascular Disease

## 2011-01-02 ENCOUNTER — Encounter: Payer: Self-pay | Admitting: Cardiovascular Disease

## 2011-01-06 ENCOUNTER — Ambulatory Visit: Payer: Medicare Other | Admitting: Cardiovascular Disease

## 2011-01-09 ENCOUNTER — Encounter: Payer: Self-pay | Admitting: Cardiovascular Disease

## 2011-01-09 ENCOUNTER — Ambulatory Visit (INDEPENDENT_AMBULATORY_CARE_PROVIDER_SITE_OTHER): Payer: Medicare Other | Admitting: Cardiovascular Disease

## 2011-01-09 DIAGNOSIS — I10 Essential (primary) hypertension: Secondary | ICD-10-CM

## 2011-01-09 DIAGNOSIS — R0602 Shortness of breath: Secondary | ICD-10-CM

## 2011-01-09 DIAGNOSIS — I2510 Atherosclerotic heart disease of native coronary artery without angina pectoris: Secondary | ICD-10-CM

## 2011-01-09 DIAGNOSIS — E785 Hyperlipidemia, unspecified: Secondary | ICD-10-CM

## 2011-01-09 MED ORDER — FUROSEMIDE 20 MG PO TABS: 20.0000 mg | ORAL_TABLET | Freq: Every day | ORAL | Status: DC | PRN

## 2011-01-09 NOTE — Assessment & Plan Note (Signed)
Blood pressure is well controlled on today's visit. No changes made to the medications. 

## 2011-01-09 NOTE — Assessment & Plan Note (Signed)
Cholesterol is at goal on the current lipid regimen. No changes to the medications were made.  

## 2011-01-09 NOTE — Assessment & Plan Note (Signed)
Shortness of breath is likely secondary to obesity and deconditioning. Also likely component of diastolic dysfunction. We have suggested she try Lasix p.r.n. For worsening shortness of breath or edema. She will probably use this electively given problems with incontinence.

## 2011-01-09 NOTE — Patient Instructions (Addendum)
You are doing well.  Please take lasix/furosemide (diuretic) as needed for edema  Decrease the aspirin to 81 mg once a day with plavix once a day  Please call us if you have new issues that need to be addressed before your next appt.  The office will contact you for a follow up Appt. In 6 months

## 2011-01-09 NOTE — Progress Notes (Signed)
Patient ID: Amy [name], female    DOB: [birth year], 77 y.o.   MRN: [id number]  HPI Comments: Ms. Amy [name] is a patient of Dr. Mable Fill with a history of coronary artery disease,  cardiac catheterization confirming severe stenosis of her proximal LAD with stent placed by Dr. Clayborn Bigness, Problems with incontinence,  who now presents to for routine follow up.     She reports that overall she has been doing well. She is not exercising, her weight has been a challenge. She does have shortness of breath. She is not very active. Occasional lower extremity edema.   cardiac catheter performed at [facility] on June 22, 2009 details 90% proximal LAD, 40% mid LAD, 40% diagonal, 60% proximal circumflex followed by 40% circumflex lesion, 30%, 40% and 30% lesion noted in the RCA with distal RCA with 30% and 25% lesions. Mild atheroma in the aorta.  Xience 2.75 x 12 mm Drug eluting stent placed.     EKG shows normal sinus rhythm with right bundle branch block, rate 99 beats a minute no significant ST-T wave changes      Outpatient Encounter Prescriptions as of 01/09/2011  Medication Sig Dispense Refill  . amLODipine (NORVASC) 2.5 MG tablet Take 2.5 mg by mouth daily.        Marland Kitchen aspirin 325 MG tablet Take 325 mg by mouth daily.        . clopidogrel (PLAVIX) 75 MG tablet Take 1 tablet (75 mg total) by mouth daily.  30 tablet  6  . glimepiride (AMARYL) 2 MG tablet Take 2 mg by mouth daily before breakfast.        . levothyroxine (LEVOTHROID) 100 MCG tablet Take 100 mcg by mouth daily.        Marland Kitchen losartan-hydrochlorothiazide (HYZAAR) 100-25 MG per tablet Take 1 tablet by mouth daily.        . metFORMIN (GLUCOPHAGE) 850 MG tablet Take 850 mg by mouth 2 (two) times daily with a meal.        . oxybutynin (DITROPAN) 5 MG tablet Take 5 mg by mouth 1 dose over 46 hours.        . simvastatin (ZOCOR) 80 MG tablet Take 80 mg by mouth at bedtime.        . tolterodine (DETROL) 2 MG tablet Take 2 mg by mouth 1 dose over 46  hours.        . furosemide (LASIX) 20 MG tablet Take 1 tablet (20 mg total) by mouth daily as needed.  30 tablet  6     Review of Systems  Constitutional: Negative.   HENT: Negative.   Eyes: Negative.   Respiratory: Positive for shortness of breath.   Gastrointestinal: Negative.   Musculoskeletal: Negative.   Skin: Negative.   Neurological: Negative.   Hematological: Negative.   Psychiatric/Behavioral: Negative.   All other systems reviewed and are negative.   BP 142/78  Pulse 99  Ht 5\' 2"  (1.575 m)  Wt 244 lb 12 oz (111.018 kg)  BMI 44.77 kg/m2   Physical Exam  Nursing note and vitals reviewed. Constitutional: She is oriented to person, place, and time. She appears well-developed and well-nourished.       Obese  HENT:  Head: Normocephalic.  Nose: Nose normal.  Mouth/Throat: Oropharynx is clear and moist.  Eyes: Conjunctivae are normal. Pupils are equal, round, and reactive to light.  Neck: Normal range of motion. Neck supple. No JVD present.  Cardiovascular: Normal rate, regular rhythm, S1 normal, S2 normal,  normal heart sounds and intact distal pulses.  Exam reveals no gallop and no friction rub.   No murmur heard. Pulmonary/Chest: Effort normal and breath sounds normal. No respiratory distress. She has no wheezes. She has no rales. She exhibits no tenderness.  Abdominal: Soft. Bowel sounds are normal. She exhibits no distension. There is no tenderness.  Musculoskeletal: Normal range of motion. She exhibits no edema and no tenderness.  Lymphadenopathy:    She has no cervical adenopathy.  Neurological: She is alert and oriented to person, place, and time. Coordination normal.  Skin: Skin is warm and dry. No rash noted. No erythema.  Psychiatric: She has a normal mood and affect. Her behavior is normal. Judgment and thought content normal.         Assessment and Plan

## 2011-01-09 NOTE — Assessment & Plan Note (Signed)
Currently with no symptoms of angina. No further workup at this time. Continue current medication regimen. 

## 2011-02-12 ENCOUNTER — Telehealth: Payer: Self-pay

## 2011-02-12 MED ORDER — CLOPIDOGREL BISULFATE 75 MG PO TABS: 75.0000 mg | ORAL_TABLET | Freq: Every day | ORAL | Status: DC

## 2011-02-12 NOTE — Telephone Encounter (Signed)
Refill sent for plavix  

## 2011-02-27 ENCOUNTER — Ambulatory Visit: Payer: Self-pay | Admitting: Internal Medicine

## 2011-02-27 LAB — IRON AND TIBC
Iron Bind.Cap.(Total): 343 ug/dL (ref 250–450)
Iron Saturation: 15 %
Iron: 50 ug/dL (ref 50–170)
Unbound Iron-Bind.Cap.: 293 ug/dL

## 2011-02-27 LAB — FERRITIN: Ferritin (ARMC): 30 ng/mL (ref 8–388)

## 2011-03-28 ENCOUNTER — Ambulatory Visit: Payer: Self-pay | Admitting: Internal Medicine

## 2011-06-17 ENCOUNTER — Ambulatory Visit: Payer: Self-pay | Admitting: Internal Medicine

## 2011-06-17 LAB — IRON AND TIBC
Iron Saturation: 8 %
Iron: 29 ug/dL — ABNORMAL LOW (ref 50–170)

## 2011-06-17 LAB — CBC CANCER CENTER
Basophil #: 0 x10 3/mm (ref 0.0–0.1)
Eosinophil %: 2.6 %
HCT: 38.2 % (ref 35.0–47.0)
HGB: 12 g/dL (ref 12.0–16.0)
Lymphocyte %: 19.6 %
MCHC: 31.4 g/dL — ABNORMAL LOW (ref 32.0–36.0)
MCV: 85 fL (ref 80–100)
Monocyte #: 0.8 x10 3/mm (ref 0.2–0.9)
Monocyte %: 9 %
Neutrophil #: 6.1 x10 3/mm (ref 1.4–6.5)
Platelet: 265 x10 3/mm (ref 150–440)
RBC: 4.47 10*6/uL (ref 3.80–5.20)
RDW: 16.5 % — ABNORMAL HIGH (ref 11.5–14.5)
WBC: 8.9 x10 3/mm (ref 3.6–11.0)

## 2011-06-17 LAB — FERRITIN: Ferritin (ARMC): 17 ng/mL (ref 8–388)

## 2011-06-25 ENCOUNTER — Ambulatory Visit: Payer: Self-pay | Admitting: Internal Medicine

## 2011-07-15 LAB — OCCULT BLOOD X 1 CARD TO LAB, STOOL
Occult Blood, Feces: NEGATIVE
Occult Blood, Feces: NEGATIVE

## 2011-07-26 ENCOUNTER — Ambulatory Visit: Payer: Self-pay | Admitting: Internal Medicine

## 2011-09-08 ENCOUNTER — Ambulatory Visit (INDEPENDENT_AMBULATORY_CARE_PROVIDER_SITE_OTHER): Payer: Medicare Other | Admitting: Cardiovascular Disease

## 2011-09-08 ENCOUNTER — Encounter: Payer: Self-pay | Admitting: Cardiovascular Disease

## 2011-09-08 VITALS — BP 162/40 | HR 86 | Ht 63.0 in | Wt 239.5 lb

## 2011-09-08 DIAGNOSIS — E785 Hyperlipidemia, unspecified: Secondary | ICD-10-CM

## 2011-09-08 DIAGNOSIS — I2510 Atherosclerotic heart disease of native coronary artery without angina pectoris: Secondary | ICD-10-CM

## 2011-09-08 DIAGNOSIS — D649 Anemia, unspecified: Secondary | ICD-10-CM

## 2011-09-08 DIAGNOSIS — D509 Iron deficiency anemia, unspecified: Secondary | ICD-10-CM | POA: Insufficient documentation

## 2011-09-08 DIAGNOSIS — R0602 Shortness of breath: Secondary | ICD-10-CM

## 2011-09-08 DIAGNOSIS — I10 Essential (primary) hypertension: Secondary | ICD-10-CM

## 2011-09-08 MED ORDER — CLONIDINE HCL 0.1 MG PO TABS: 0.1000 mg | ORAL_TABLET | Freq: Two times a day (BID) | ORAL | Status: DC

## 2011-09-08 NOTE — Assessment & Plan Note (Signed)
Followed by Dr. Ma Hillock. Receiving iron transfusions per the patient.

## 2011-09-08 NOTE — Patient Instructions (Addendum)
You are doing well. Decrese the aspirin to 81 mg a day with plavix one a day Please start clonidine 0.1 mg twice a day  Please call us if you have new issues that need to be addressed before your next appt.  Your physician wants you to follow-up in: 3 months.  You will receive a reminder letter in the mail two months in advance. If you don't receive a letter, please call our office to schedule the follow-up appointment.

## 2011-09-08 NOTE — Assessment & Plan Note (Signed)
Blood pressure is poorly controlled on today's visit. We will avoid increasing her calcium channel blocker dose given lower extremity edema. We will try to avoid beta blockers given history of bradycardia. We'll start clonidine 0.1 mg twice a day. She has followup with Dr. Mable Fill at the end of the month.

## 2011-09-08 NOTE — Assessment & Plan Note (Signed)
We have suggested she stay on her statin. 

## 2011-09-08 NOTE — Progress Notes (Signed)
Patient ID: Amy [name], female    DOB: [birth year], 78 y.o.   MRN: [id number]  HPI Comments: [name] is a patient of Dr. Mable Fill with a history of coronary artery disease,  cardiac catheterization confirming severe stenosis of her proximal LAD with stent placed by Dr. Clayborn Bigness, Problems with incontinence,  who now presents to for routine follow up.   Beta blocker has been held in the past secondary to bradycardia. She was on low doses of atenolol   She reports that overall she has been doing well. She is not exercising, her weight has been a challenge. She does have shortness of breath. She is not very active. Occasional lower extremity edema. She does not check her blood pressure at home. She has been having problems with anemia and is having iron transfusions under the direction of Dr. Ma Hillock. Otherwise she feels about the same with no new complaints.   cardiac catheter performed at [facility] on June 22, 2009 details 90% proximal LAD, 40% mid LAD, 40% diagonal, 60% proximal circumflex followed by 40% circumflex lesion, 30%, 40% and 30% lesion noted in the RCA with distal RCA with 30% and 25% lesions. Mild atheroma in the aorta.  Xience 2.75 x 12 mm Drug eluting stent placed.     EKG shows normal sinus rhythm with right bundle branch block, rate 86 beats a minute no significant ST-T wave changes      Outpatient Encounter Prescriptions as of 01/09/2011  Medication Sig Dispense Refill  . amLODipine (NORVASC) 2.5 MG tablet Take 2.5 mg by mouth daily.        Marland Kitchen aspirin 325 MG tablet Take 325 mg by mouth daily.        . clopidogrel (PLAVIX) 75 MG tablet Take 1 tablet (75 mg total) by mouth daily.  30 tablet  6  . glimepiride (AMARYL) 2 MG tablet Take 2 mg by mouth daily before breakfast.        . levothyroxine (LEVOTHROID) 100 MCG tablet Take 100 mcg by mouth daily.        Marland Kitchen losartan-hydrochlorothiazide (HYZAAR) 100-25 MG per tablet Take 1 tablet by mouth daily.        . metFORMIN  (GLUCOPHAGE) 850 MG tablet Take 850 mg by mouth 2 (two) times daily with a meal.        . oxybutynin (DITROPAN) 5 MG tablet Take 5 mg by mouth 1 dose over 46 hours.        . simvastatin (ZOCOR) 80 MG tablet Take 80 mg by mouth at bedtime.        . tolterodine (DETROL) 2 MG tablet Take 2 mg by mouth 1 dose over 46 hours.        . furosemide (LASIX) 20 MG tablet Take 1 tablet (20 mg total) by mouth daily as needed.  30 tablet  6    Review of Systems  Constitutional: Negative.   HENT: Negative.   Eyes: Negative.   Respiratory: Positive for shortness of breath.   Cardiovascular: Positive for leg swelling.  Gastrointestinal: Negative.   Musculoskeletal: Negative.   Skin: Negative.   Neurological: Negative.   Hematological: Negative.   Psychiatric/Behavioral: Negative.   All other systems reviewed and are negative.   BP 162/40  Pulse 86  Ht 5\' 3"  (1.6 m)  Wt 239 lb 8 oz (108.636 kg)  BMI 42.43 kg/m2 Repeat blood pressure confirmed systolic pressure of 123XX123  Physical Exam  Nursing note and vitals reviewed. Constitutional: She is oriented  to person, place, and time. She appears well-developed and well-nourished.       Obese  HENT:  Head: Normocephalic.  Nose: Nose normal.  Mouth/Throat: Oropharynx is clear and moist.  Eyes: Conjunctivae are normal. Pupils are equal, round, and reactive to light.  Neck: Normal range of motion. Neck supple. No JVD present.  Cardiovascular: Normal rate, regular rhythm, S1 normal, S2 normal, normal heart sounds and intact distal pulses.  Exam reveals no gallop and no friction rub.   No murmur heard. Pulmonary/Chest: Effort normal and breath sounds normal. No respiratory distress. She has no wheezes. She has no rales. She exhibits no tenderness.  Abdominal: Soft. Bowel sounds are normal. She exhibits no distension. There is no tenderness.  Musculoskeletal: Normal range of motion. She exhibits no edema and no tenderness.  Lymphadenopathy:    She has no  cervical adenopathy.  Neurological: She is alert and oriented to person, place, and time. Coordination normal.  Skin: Skin is warm and dry. No rash noted. No erythema.  Psychiatric: She has a normal mood and affect. Her behavior is normal. Judgment and thought content normal.         Assessment and Plan

## 2011-09-08 NOTE — Assessment & Plan Note (Signed)
Likely multifactorial including obesity, deconditioning. Suspect mild diastolic heart failure. She takes Lasix when necessary, but does have problems with incontinence.

## 2011-09-08 NOTE — Assessment & Plan Note (Signed)
Currently with no symptoms of angina. No further workup at this time. Continue current medication regimen. 

## 2011-09-09 ENCOUNTER — Telehealth: Payer: Self-pay | Admitting: *Deleted

## 2011-09-09 NOTE — Telephone Encounter (Signed)
Spoke with pt to confirm dosage of Simvastatin refill request that she has been taking due to last visit and prior visit back in November of 2012 shows that she was taking 80 mg but she mentioned that she is only taking 40 mg daily. She would like to know if you would like her to continue on the 40 mg or take the 80 mg?

## 2011-09-09 NOTE — Telephone Encounter (Signed)
If she is taking 40 mg, would continue 40 mg daily Can change script to simva 40 daily

## 2011-09-10 ENCOUNTER — Other Ambulatory Visit: Payer: Self-pay

## 2011-09-10 MED ORDER — SIMVASTATIN 40 MG PO TABS: 40.0000 mg | ORAL_TABLET | Freq: Every day | ORAL | Status: DC

## 2011-09-10 NOTE — Telephone Encounter (Signed)
LM on machine telling pt to stay on same dose and we will update our records

## 2011-09-24 ENCOUNTER — Ambulatory Visit: Payer: Self-pay | Admitting: Internal Medicine

## 2011-09-24 LAB — CBC CANCER CENTER
Basophil #: 0 x10 3/mm (ref 0.0–0.1)
Basophil %: 0.3 %
HCT: 43.3 % (ref 35.0–47.0)
HGB: 14.3 g/dL (ref 12.0–16.0)
Lymphocyte #: 1.7 x10 3/mm (ref 1.0–3.6)
MCH: 31.2 pg (ref 26.0–34.0)
MCHC: 33.1 g/dL (ref 32.0–36.0)
MCV: 94 fL (ref 80–100)
Monocyte #: 0.6 x10 3/mm (ref 0.2–0.9)
Monocyte %: 9.6 %
Neutrophil #: 4.1 x10 3/mm (ref 1.4–6.5)
Platelet: 216 x10 3/mm (ref 150–440)
RBC: 4.59 10*6/uL (ref 3.80–5.20)

## 2011-09-24 LAB — IRON AND TIBC
Iron Saturation: 27 %
Iron: 90 ug/dL (ref 50–170)
Unbound Iron-Bind.Cap.: 239 ug/dL

## 2011-09-25 ENCOUNTER — Ambulatory Visit: Payer: Self-pay | Admitting: Internal Medicine

## 2011-10-08 ENCOUNTER — Other Ambulatory Visit: Payer: Self-pay | Admitting: *Deleted

## 2011-10-08 MED ORDER — CLOPIDOGREL BISULFATE 75 MG PO TABS: 75.0000 mg | ORAL_TABLET | Freq: Every day | ORAL | Status: DC

## 2011-11-25 ENCOUNTER — Ambulatory Visit: Payer: Self-pay

## 2011-12-09 ENCOUNTER — Ambulatory Visit (INDEPENDENT_AMBULATORY_CARE_PROVIDER_SITE_OTHER): Payer: Medicare Other | Admitting: Cardiovascular Disease

## 2011-12-09 ENCOUNTER — Encounter: Payer: Self-pay | Admitting: Cardiovascular Disease

## 2011-12-09 VITALS — BP 160/60 | HR 74 | Ht 62.0 in | Wt 239.5 lb

## 2011-12-09 DIAGNOSIS — I10 Essential (primary) hypertension: Secondary | ICD-10-CM

## 2011-12-09 DIAGNOSIS — R0602 Shortness of breath: Secondary | ICD-10-CM

## 2011-12-09 DIAGNOSIS — E785 Hyperlipidemia, unspecified: Secondary | ICD-10-CM

## 2011-12-09 DIAGNOSIS — I2510 Atherosclerotic heart disease of native coronary artery without angina pectoris: Secondary | ICD-10-CM

## 2011-12-09 DIAGNOSIS — D649 Anemia, unspecified: Secondary | ICD-10-CM

## 2011-12-09 MED ORDER — AMLODIPINE BESYLATE 10 MG PO TABS: 10.0000 mg | ORAL_TABLET | Freq: Every day | ORAL | Status: DC

## 2011-12-09 NOTE — Assessment & Plan Note (Signed)
Currently with no symptoms of angina. No further workup at this time. Continue current medication regimen. We will closely monitor her symptoms of shortness of breath.

## 2011-12-09 NOTE — Assessment & Plan Note (Signed)
Shortness of breath has been a chronic issue. Uncertain if this is from uncontrolled hypertension. We will change her blood pressure medications today for symptom relief. Also suggested she restart low-dose Lasix.

## 2011-12-09 NOTE — Assessment & Plan Note (Signed)
Cholesterol is at goal on the current lipid regimen. No changes to the medications were made.  

## 2011-12-09 NOTE — Patient Instructions (Addendum)
Please take lasix every other day with banana  Increase the amlodipine to 5 mg a day (two of the 2.5 mg pills) When you run out of amlodipine, new script will be for 10 mg pills   If blood pressure continues to run high, take a full amlodipine pill Monitor blood pressure   Call the office for worsening leg swelling (could be from higher dose amlodipine)  Please call us if you have new issues that need to be addressed before your next appt.  Your physician wants you to follow-up in: 6 weeks  You will receive a reminder letter in the mail two months in advance. If you don't receive a letter, please call our office to schedule the follow-up appointment.

## 2011-12-09 NOTE — Assessment & Plan Note (Signed)
We will increase her amlodipine to 5 mg daily with possible increase to 10 mg if blood pressure remains high. We'll also add low-dose Lasix every other day.

## 2011-12-09 NOTE — Progress Notes (Signed)
Patient ID: Amy [name], female    DOB: 10/30/1927, 78 y.o.   MRN: [id number]  HPI Comments: Amy [name] is a patient of Dr. Mable Fill with a history of coronary artery disease,  cardiac catheterization confirming severe stenosis of her proximal LAD with stent placed by Dr. Clayborn Bigness, Problems with incontinence,  who now presents to for routine follow up.   Beta blocker has been held in the past secondary to bradycardia. She was on low doses of atenolol   She reports that overall she has been doing well. She is not exercising, her weight has been a challenge. She does have shortness of breath. This has been a chronic issue. She is not very active. Occasional lower extremity edema. She does not check her blood pressure at home. She has received iron transfusions under the direction of Dr. Ma Hillock. Otherwise she feels about the same with no new complaints. Still with shortness of breath on today's visit.  Hemoglobin A1c 7.0, total cholesterol 131, LDL 47  cardiac catheter performed at [facility] on June 22, 2009 details 90% proximal LAD, 40% mid LAD, 40% diagonal, 60% proximal circumflex followed by 40% circumflex lesion, 30%, 40% and 30% lesion noted in the RCA with distal RCA with 30% and 25% lesions. Mild atheroma in the aorta.  Xience 2.75 x 12 mm Drug eluting stent placed.     EKG shows normal sinus rhythm with right bundle branch block, rate 74 beats a minute no significant ST-T wave changes      Outpatient Encounter Prescriptions as of 12/09/2011  Medication Sig Dispense Refill  . aspirin 81 MG tablet Take 81 mg by mouth daily.      . cloNIDine (CATAPRES) 0.1 MG tablet Take 1 tablet (0.1 mg total) by mouth 2 (two) times daily.  60 tablet  11  . clopidogrel (PLAVIX) 75 MG tablet Take 1 tablet (75 mg total) by mouth daily.  30 tablet  6  . furosemide (LASIX) 20 MG tablet Take 1 tablet (20 mg total) by mouth daily as needed.  30 tablet  6  . glimepiride (AMARYL) 4 MG tablet Take 4 mg by  mouth daily before breakfast.      . levothyroxine (LEVOTHROID) 100 MCG tablet Take 100 mcg by mouth daily.        Marland Kitchen losartan-hydrochlorothiazide (HYZAAR) 100-25 MG per tablet Take 1 tablet by mouth daily.        . metFORMIN (GLUCOPHAGE) 850 MG tablet Take 850 mg by mouth 2 (two) times daily with a meal.        . Multiple Vitamins-Minerals (PRESERVISION AREDS PO) Take by mouth 2 (two) times daily.      . NON FORMULARY Iron transfusion as needed or directed. [facility].      Marland Kitchen oxybutynin (DITROPAN) 5 MG tablet Take 5 mg by mouth 1 dose over 46 hours.        . simvastatin (ZOCOR) 40 MG tablet Take 1 tablet (40 mg total) by mouth at bedtime.  90 tablet  3  . tolterodine (DETROL) 2 MG tablet Take 2 mg by mouth 1 dose over 46 hours.        Marland Kitchen amLODipine (NORVASC) 2.5 MG tablet Take 2.5 mg by mouth daily.        Marland Kitchen DISCONTD: aspirin 325 MG tablet Take 325 mg by mouth daily.        Marland Kitchen DISCONTD: glimepiride (AMARYL) 2 MG tablet Take 2 mg by mouth daily before breakfast.  Review of Systems  Constitutional: Negative.   HENT: Negative.   Eyes: Negative.   Respiratory: Positive for shortness of breath.   Cardiovascular: Positive for leg swelling.  Gastrointestinal: Negative.   Musculoskeletal: Negative.   Skin: Negative.   Neurological: Negative.   Hematological: Negative.   Psychiatric/Behavioral: Negative.   All other systems reviewed and are negative.   BP 160/60  Pulse 74  Ht 5\' 2"  (1.575 m)  Wt 239 lb 8 oz (108.636 kg)  BMI 43.80 kg/m2 Repeat blood pressure shows systolic [id number]  Physical Exam  Nursing note and vitals reviewed. Constitutional: She is oriented to person, place, and time. She appears well-developed and well-nourished.       Obese  HENT:  Head: Normocephalic.  Nose: Nose normal.  Mouth/Throat: Oropharynx is clear and moist.  Eyes: Conjunctivae normal are normal. Pupils are equal, round, and reactive to light.  Neck: Normal range of motion. Neck  supple. No JVD present.  Cardiovascular: Normal rate, regular rhythm, S1 normal, S2 normal, normal heart sounds and intact distal pulses.  Exam reveals no gallop and no friction rub.   No murmur heard. Pulmonary/Chest: Effort normal and breath sounds normal. No respiratory distress. She has no wheezes. She has no rales. She exhibits no tenderness.  Abdominal: Soft. Bowel sounds are normal. She exhibits no distension. There is no tenderness.  Musculoskeletal: Normal range of motion. She exhibits no edema and no tenderness.  Lymphadenopathy:    She has no cervical adenopathy.  Neurological: She is alert and oriented to person, place, and time. Coordination normal.  Skin: Skin is warm and dry. No rash noted. No erythema.  Psychiatric: She has a normal mood and affect. Her behavior is normal. Judgment and thought content normal.         Assessment and Plan

## 2011-12-09 NOTE — Assessment & Plan Note (Signed)
Most recent blood count in July was back to normal.

## 2011-12-17 ENCOUNTER — Telehealth: Payer: Self-pay | Admitting: Cardiovascular Disease

## 2011-12-17 NOTE — Telephone Encounter (Signed)
Pt calling with bp readings after med change   131/60 p 87  7:00 AM 97/55 P 98   2:00 PM 117/51 P 81 6:00 AM' 123/57 P 85 6:00PM 125/56 P 79 6:00 AM 103/41 P 71 12:00 AM 134/47 P 76 7:00 AM 106/55 P 70 9:00 PM 95/47 P 73 1:00 PM 137/48 P 68 9:00 AM

## 2011-12-17 NOTE — Telephone Encounter (Signed)
Looks much better Would continue current medications

## 2011-12-17 NOTE — Telephone Encounter (Signed)
See below Much better

## 2011-12-18 NOTE — Telephone Encounter (Signed)
Pt informed. Understanding verb She requests to move appt out by a few weeks since BPs are doing better R/s appt to 12/17

## 2012-01-08 ENCOUNTER — Other Ambulatory Visit: Payer: Self-pay | Admitting: *Deleted

## 2012-01-08 ENCOUNTER — Emergency Department: Payer: Self-pay | Admitting: Emergency Medicine

## 2012-01-08 LAB — CBC
HGB: 13.2 g/dL (ref 12.0–16.0)
MCH: 31.2 pg (ref 26.0–34.0)
MCHC: 33.5 g/dL (ref 32.0–36.0)
MCV: 93 fL (ref 80–100)
Platelet: 252 10*3/uL (ref 150–440)
RBC: 4.25 10*6/uL (ref 3.80–5.20)
RDW: 14.2 % (ref 11.5–14.5)
WBC: 7.7 10*3/uL (ref 3.6–11.0)

## 2012-01-08 LAB — BASIC METABOLIC PANEL
Anion Gap: 12 (ref 7–16)
BUN: 27 mg/dL — ABNORMAL HIGH (ref 7–18)
Creatinine: 1.18 mg/dL (ref 0.60–1.30)
EGFR (African American): 50 — ABNORMAL LOW
EGFR (Non-African Amer.): 43 — ABNORMAL LOW
Glucose: 204 mg/dL — ABNORMAL HIGH (ref 65–99)
Osmolality: 287 (ref 275–301)
Potassium: 3.9 mmol/L (ref 3.5–5.1)
Sodium: 138 mmol/L (ref 136–145)

## 2012-01-08 LAB — CK TOTAL AND CKMB (NOT AT ARMC)
CK, Total: 35 U/L (ref 21–215)
CK-MB: 0.5 ng/mL (ref 0.5–3.6)

## 2012-01-08 LAB — TROPONIN I
Troponin-I: <0.02 ng/mL
Troponin-I: <0.02 ng/mL

## 2012-01-08 MED ORDER — SIMVASTATIN 40 MG PO TABS: 40.0000 mg | ORAL_TABLET | Freq: Every day | ORAL | Status: DC

## 2012-01-08 NOTE — Telephone Encounter (Signed)
Refilled Simvastatin.

## 2012-01-09 ENCOUNTER — Other Ambulatory Visit: Payer: Self-pay

## 2012-01-09 ENCOUNTER — Telehealth: Payer: Self-pay

## 2012-01-09 DIAGNOSIS — R079 Chest pain, unspecified: Secondary | ICD-10-CM

## 2012-01-09 NOTE — Telephone Encounter (Signed)
Verbal instructions given to pt re: Lexiscan  Understanding verb

## 2012-01-09 NOTE — Telephone Encounter (Signed)
Pt wants to schedule lexiscan for 11/20 at 0730 I will call to schedule and call pt back

## 2012-01-09 NOTE — Telephone Encounter (Signed)
"  schedule pt for lexiscan at her convenience" VO Dr. Mills Koller, RN

## 2012-01-14 ENCOUNTER — Ambulatory Visit: Payer: Self-pay | Admitting: Cardiovascular Disease

## 2012-01-16 ENCOUNTER — Other Ambulatory Visit: Payer: Self-pay | Admitting: Cardiovascular Disease

## 2012-01-16 DIAGNOSIS — R079 Chest pain, unspecified: Secondary | ICD-10-CM

## 2012-01-20 ENCOUNTER — Ambulatory Visit: Payer: Medicare Other | Admitting: Cardiovascular Disease

## 2012-01-28 ENCOUNTER — Ambulatory Visit: Payer: Self-pay | Admitting: Internal Medicine

## 2012-01-28 LAB — IRON AND TIBC
Iron Bind.Cap.(Total): 359 ug/dL (ref 250–450)
Iron: 61 ug/dL (ref 50–170)
Unbound Iron-Bind.Cap.: 298 ug/dL

## 2012-02-10 ENCOUNTER — Encounter: Payer: Self-pay | Admitting: Cardiovascular Disease

## 2012-02-10 ENCOUNTER — Ambulatory Visit (INDEPENDENT_AMBULATORY_CARE_PROVIDER_SITE_OTHER): Payer: Medicare Other | Admitting: Cardiovascular Disease

## 2012-02-10 VITALS — BP 150/68 | HR 81 | Ht 62.0 in | Wt 235.0 lb

## 2012-02-10 DIAGNOSIS — I2510 Atherosclerotic heart disease of native coronary artery without angina pectoris: Secondary | ICD-10-CM

## 2012-02-10 DIAGNOSIS — R0602 Shortness of breath: Secondary | ICD-10-CM

## 2012-02-10 DIAGNOSIS — E785 Hyperlipidemia, unspecified: Secondary | ICD-10-CM

## 2012-02-10 DIAGNOSIS — I10 Essential (primary) hypertension: Secondary | ICD-10-CM

## 2012-02-10 MED ORDER — FUROSEMIDE 20 MG PO TABS: 20.0000 mg | ORAL_TABLET | Freq: Two times a day (BID) | ORAL | Status: DC | PRN

## 2012-02-10 NOTE — Patient Instructions (Addendum)
You are doing well. No medication changes were made. Please take lasix every other day, Take two lasix for significant shortness of breath or edema   Please call us if you have new issues that need to be addressed before your next appt.  Your physician wants you to follow-up in: 3 months.  You will receive a reminder letter in the mail two months in advance. If you don't receive a letter, please call our office to schedule the follow-up appointment.

## 2012-02-10 NOTE — Assessment & Plan Note (Signed)
Shortness of breath is likely multifactorial including obesity, deconditioning, diastolic CHF and hypertension. We have suggested she take her Lasix on more regular basis, decrease her fluid intake. Her weight continues to be a problem.

## 2012-02-10 NOTE — Progress Notes (Signed)
Patient ID: Amy [name], female    DOB: 1927/12/22, 78 y.o.   MRN: [id number]  HPI Comments: Amy [name] is a former patient of Dr. Mable Fill with a history of coronary artery disease,  cardiac catheterization confirming severe stenosis of her proximal LAD with stent placed by Dr. Clayborn Bigness, Problems with incontinence,  who now presents to for routine follow up.   Beta blocker has been held in the past secondary to bradycardia. She was on low doses of atenolol  She reports that she was recently walking in the hospital to schedule a colonoscopy appointment. She is not usually walk that far and developed significant shortness of breath. The people at the desk were along by her shortness of breath symptoms and he sent her to the emergency room for further evaluation. Systolic pressure she reports was in the 170 range. workup at that time was essentially normal and she was discharged home. She is taking Lasix only periodically for edema and shortness of breath  She does report her blood pressure at home has been in the 140 range. She has lower extremity edema. She has received iron transfusions under the direction of Dr. Ma Hillock.   Hemoglobin A1c 7.0, total cholesterol 131, LDL 47  cardiac catheter performed at [facility] on June 22, 2009 details 90% proximal LAD, 40% mid LAD, 40% diagonal, 60% proximal circumflex followed by 40% circumflex lesion, 30%, 40% and 30% lesion noted in the RCA with distal RCA with 30% and 25% lesions. Mild atheroma in the aorta.  Xience 2.75 x 12 mm Drug eluting stent placed.    Recent stress test 01/14/2012 showing no ischemia, Lexiscan   EKG shows normal sinus rhythm with right bundle branch block, rate of 81 beats per minute,  no significant ST-T wave changes      Outpatient Encounter Prescriptions as of 02/10/2012  Medication Sig Dispense Refill  . amLODipine (NORVASC) 10 MG tablet Take 5 mg by mouth daily.      Marland Kitchen aspirin 81 MG tablet Take 81 mg by mouth daily.       . cloNIDine (CATAPRES) 0.1 MG tablet Take 1 tablet (0.1 mg total) by mouth 2 (two) times daily.  60 tablet  11  . clopidogrel (PLAVIX) 75 MG tablet Take 1 tablet (75 mg total) by mouth daily.  30 tablet  6  . furosemide (LASIX) 20 MG tablet Take 1 tablet (20 mg total) by mouth 2 (two) times daily as needed.  60 tablet  6  . glimepiride (AMARYL) 4 MG tablet Take 4 mg by mouth daily before breakfast.      . levothyroxine (SYNTHROID, LEVOTHROID) 125 MCG tablet Take 125 mcg by mouth daily.      Marland Kitchen losartan-hydrochlorothiazide (HYZAAR) 100-25 MG per tablet Take 1 tablet by mouth daily.        . metFORMIN (GLUCOPHAGE) 850 MG tablet Take 850 mg by mouth 2 (two) times daily with a meal.        . Multiple Vitamins-Minerals (PRESERVISION AREDS PO) Take by mouth 2 (two) times daily.      . NON FORMULARY Iron transfusion as needed or [facility].      Marland Kitchen oxybutynin (DITROPAN) 5 MG tablet Take 5 mg by mouth 1 dose over 46 hours.        . simvastatin (ZOCOR) 40 MG tablet Take 1 tablet (40 mg total) by mouth at bedtime.  90 tablet  3  . tolterodine (DETROL) 2 MG tablet Take 2 mg  by mouth 1 dose over 46 hours.          Review of Systems  Constitutional: Negative.   HENT: Negative.   Eyes: Negative.   Respiratory: Positive for shortness of breath.   Cardiovascular: Positive for leg swelling.  Gastrointestinal: Negative.   Musculoskeletal: Negative.   Skin: Negative.   Neurological: Negative.   Hematological: Negative.   Psychiatric/Behavioral: Negative.   All other systems reviewed and are negative.   BP 150/68  Pulse 81  Ht 5\' 2"  (1.575 m)  Wt 235 lb (106.595 kg)  BMI 42.98 kg/m2  Physical Exam  Nursing note and vitals reviewed. Constitutional: She is oriented to person, place, and time. She appears well-developed and well-nourished.       Obese  HENT:  Head: Normocephalic.  Nose: Nose normal.  Mouth/Throat: Oropharynx is clear and moist.  Eyes: Conjunctivae normal  are normal. Pupils are equal, round, and reactive to light.  Neck: Normal range of motion. Neck supple. No JVD present.  Cardiovascular: Normal rate, regular rhythm, S1 normal, S2 normal, normal heart sounds and intact distal pulses.  Exam reveals no gallop and no friction rub.   No murmur heard. Pulmonary/Chest: Effort normal and breath sounds normal. No respiratory distress. She has no wheezes. She has no rales. She exhibits no tenderness.  Abdominal: Soft. Bowel sounds are normal. She exhibits no distension. There is no tenderness.  Musculoskeletal: Normal range of motion. She exhibits no edema and no tenderness.  Lymphadenopathy:    She has no cervical adenopathy.  Neurological: She is alert and oriented to person, place, and time. Coordination normal.  Skin: Skin is warm and dry. No rash noted. No erythema.  Psychiatric: She has a normal mood and affect. Her behavior is normal. Judgment and thought content normal.         Assessment and Plan

## 2012-02-10 NOTE — Assessment & Plan Note (Signed)
Currently with no symptoms of angina. No further workup at this time. Continue current medication regimen. Recent negative stress test November 2013.

## 2012-02-10 NOTE — Assessment & Plan Note (Signed)
Pressure is mildly elevated. We have suggested she stay on her Lasix daily rather than as needed. She has significant shortness of breath and 1+ pitting edema.

## 2012-02-10 NOTE — Assessment & Plan Note (Signed)
Cholesterol is at goal on the current lipid regimen. No changes to the medications were made.  

## 2012-02-25 ENCOUNTER — Ambulatory Visit: Payer: Self-pay | Admitting: Internal Medicine

## 2012-02-26 ENCOUNTER — Encounter: Payer: Self-pay | Admitting: Cardiovascular Disease

## 2012-05-11 ENCOUNTER — Ambulatory Visit: Payer: Medicare Other | Admitting: Cardiovascular Disease

## 2012-05-11 ENCOUNTER — Other Ambulatory Visit: Payer: Self-pay

## 2012-05-11 MED ORDER — CLOPIDOGREL BISULFATE 75 MG PO TABS: 75.0000 mg | ORAL_TABLET | Freq: Every day | ORAL | Status: DC

## 2012-05-11 MED ORDER — SIMVASTATIN 40 MG PO TABS: 40.0000 mg | ORAL_TABLET | Freq: Every day | ORAL | Status: DC

## 2012-05-11 NOTE — Telephone Encounter (Signed)
Refill sent for plavix  

## 2012-05-11 NOTE — Telephone Encounter (Signed)
Refill sent for simvastatin.  

## 2012-05-25 ENCOUNTER — Ambulatory Visit: Payer: Self-pay | Admitting: Internal Medicine

## 2012-05-26 LAB — FERRITIN: Ferritin (ARMC): 16 ng/mL (ref 8–388)

## 2012-05-26 LAB — IRON AND TIBC
Iron Saturation: 7 %
Iron: 28 ug/dL — ABNORMAL LOW (ref 50–170)

## 2012-06-02 ENCOUNTER — Telehealth: Payer: Self-pay | Admitting: *Deleted

## 2012-06-02 NOTE — Telephone Encounter (Signed)
Will address at [place]

## 2012-06-02 NOTE — Telephone Encounter (Signed)
[name] from Dr Ma Hillock office called wanting to know if pt is ok to have upper GI and a colonscopy. Pt is having dark stools. Pt was told in the past by Dr Rockey Situ that she could not tolerate the procedure. Please call office [phone] today or tomorrow in [place]. Pt has appt tomorrow with Rockey Situ

## 2012-06-03 ENCOUNTER — Ambulatory Visit (INDEPENDENT_AMBULATORY_CARE_PROVIDER_SITE_OTHER): Payer: Medicare Other | Admitting: Cardiovascular Disease

## 2012-06-03 ENCOUNTER — Encounter: Payer: Self-pay | Admitting: Cardiovascular Disease

## 2012-06-03 VITALS — BP 168/60 | HR 63 | Ht 62.0 in | Wt 231.0 lb

## 2012-06-03 DIAGNOSIS — E785 Hyperlipidemia, unspecified: Secondary | ICD-10-CM

## 2012-06-03 DIAGNOSIS — I2510 Atherosclerotic heart disease of native coronary artery without angina pectoris: Secondary | ICD-10-CM

## 2012-06-03 DIAGNOSIS — Z01810 Encounter for preprocedural cardiovascular examination: Secondary | ICD-10-CM | POA: Insufficient documentation

## 2012-06-03 DIAGNOSIS — I10 Essential (primary) hypertension: Secondary | ICD-10-CM

## 2012-06-03 DIAGNOSIS — R0602 Shortness of breath: Secondary | ICD-10-CM

## 2012-06-03 DIAGNOSIS — D649 Anemia, unspecified: Secondary | ICD-10-CM

## 2012-06-03 NOTE — Assessment & Plan Note (Signed)
Cholesterol is at goal on the current lipid regimen. No changes to the medications were made.  

## 2012-06-03 NOTE — Assessment & Plan Note (Signed)
She sees Dr. Ma Hillock. Receiving iron infusions recently.scheduled for EGD and colonoscopy

## 2012-06-03 NOTE — Assessment & Plan Note (Signed)
She would be acceptable risk to stop her Plavix for EGD and colonoscopy. Would like to continue on low-dose aspirin if possible. If this needs to be stopped prior to the procedure, certainly it could be done. She is several years out from her stent but still at some risk.

## 2012-06-03 NOTE — Patient Instructions (Addendum)
You are doing well. No medication changes were made.  Please check your blood pressure once or twice a day Call the office with the numbers, or drop off  Please call us if you have new issues that need to be addressed before your next appt.  Your physician wants you to follow-up in: 6 months.  You will receive a reminder letter in the mail two months in advance. If you don't receive a letter, please call our office to schedule the follow-up appointment.

## 2012-06-03 NOTE — Assessment & Plan Note (Signed)
Currently with no symptoms of angina. No further workup at this time. Continue current medication regimen. 

## 2012-06-03 NOTE — Assessment & Plan Note (Signed)
Shortness of breath likely multifactorial including obesity, deconditioning. Possible mild diastolic CHF and we have recommended she take Lasix periodically for any edema or worsening shortness of breath. Shortness of breath could also be coming from her anemia.

## 2012-06-03 NOTE — Assessment & Plan Note (Signed)
Blood pressure is elevated. We've asked her to monitor her blood pressure once or twice a day and call our office with the numbers. If blood pressure is high, we would increase her amlodipine to 10 mg daily.

## 2012-06-03 NOTE — Progress Notes (Signed)
Patient ID: Amy [name], female    DOB: 04/23/1927, 79 y.o.   MRN: [id number]  HPI Comments: Amy [name] is a pleasant 79 year old woman with a history of coronary artery disease,  cardiac catheterization confirming severe stenosis of her proximal LAD with stent placed, problems with incontinence,  who now presents to for routine follow up.   Beta blocker has been held in the past secondary to bradycardia. She was on low doses of atenolol  Prior episode of shortness of breath while walking in the hospital to schedule a colonoscopy appointment. sent  to the emergency room for further evaluation. Systolic pressure  was in the 170 range. workup at that time was essentially normal and she was discharged home. She is taking Lasix only periodically for edema and shortness of breath. Overall she feels well but is not walking very much and is deconditioned. She is scheduled to undergo EGD, colonoscopy and possible video enteroscopy for anemia and low iron.  She's not been checking her blood pressure. She has minimal lower extremity edema. She has received iron transfusions under the direction of Dr. Ma Hillock.   Recent total cholesterol 129 Ferritin 16, iron 28 and iron saturation of 7, hemoglobin 11.7  cardiac catheter performed at [facility] on June 22, 2009 details 90% proximal LAD, 40% mid LAD, 40% diagonal, 60% proximal circumflex followed by 40% circumflex lesion, 30%, 40% and 30% lesion noted in the RCA with distal RCA with 30% and 25% lesions. Mild atheroma in the aorta.  Xience 2.75 x 12 mm Drug eluting stent placed.     stress test 01/14/2012 showing no ischemia, Lexiscan   EKG shows normal sinus rhythm with right bundle branch block, rate of 63 beats per minute,  no significant ST-T wave changes      Outpatient Encounter Prescriptions as of 06/03/2012  Medication Sig Dispense Refill  . amLODipine (NORVASC) 10 MG tablet Take 5 mg by mouth daily.      Marland Kitchen aspirin 81 MG tablet Take 81 mg by  mouth daily.      . cloNIDine (CATAPRES) 0.1 MG tablet Take 1 tablet (0.1 mg total) by mouth 2 (two) times daily.  60 tablet  11  . clopidogrel (PLAVIX) 75 MG tablet Take 1 tablet (75 mg total) by mouth daily.  30 tablet  6  . furosemide (LASIX) 20 MG tablet Take 1 tablet (20 mg total) by mouth 2 (two) times daily as needed.  60 tablet  6  . glimepiride (AMARYL) 4 MG tablet Take 4 mg by mouth daily before breakfast.      . levothyroxine (SYNTHROID, LEVOTHROID) 125 MCG tablet Take 125 mcg by mouth daily.      Marland Kitchen losartan-hydrochlorothiazide (HYZAAR) 100-25 MG per tablet Take 1 tablet by mouth daily.        . metFORMIN (GLUCOPHAGE) 850 MG tablet Take 1,000 mg by mouth 2 (two) times daily with a meal.       . Multiple Vitamins-Minerals (PRESERVISION AREDS PO) Take by mouth 2 (two) times daily.      . NON FORMULARY Iron transfusion as needed or directed. [facility].      Marland Kitchen oxybutynin (DITROPAN) 5 MG tablet Take 5 mg by mouth 1 dose over 46 hours.        . simvastatin (ZOCOR) 40 MG tablet Take 1 tablet (40 mg total) by mouth at bedtime.  90 tablet  3  . tolterodine (DETROL) 2 MG tablet Take 2 mg by mouth 1 dose  over 46 hours.          Review of Systems  Constitutional: Negative.   HENT: Negative.   Eyes: Negative.   Respiratory: Positive for shortness of breath.   Gastrointestinal: Negative.   Musculoskeletal: Negative.   Skin: Negative.   Neurological: Negative.   Psychiatric/Behavioral: Negative.   All other systems reviewed and are negative.   BP 168/60  Pulse 63  Ht 5\' 2"  (1.575 m)  Wt 231 lb (104.781 kg)  BMI 42.24 kg/m2  Physical Exam  Nursing note and vitals reviewed. Constitutional: She is oriented to person, place, and time. She appears well-developed and well-nourished.  Obese  HENT:  Head: Normocephalic.  Nose: Nose normal.  Mouth/Throat: Oropharynx is clear and moist.  Eyes: Conjunctivae are normal. Pupils are equal, round, and reactive to light.  Neck:  Normal range of motion. Neck supple. No JVD present.  Cardiovascular: Normal rate, regular rhythm, S1 normal, S2 normal, normal heart sounds and intact distal pulses.  Exam reveals no gallop and no friction rub.   No murmur heard. Pulmonary/Chest: Effort normal and breath sounds normal. No respiratory distress. She has no wheezes. She has no rales. She exhibits no tenderness.  Abdominal: Soft. Bowel sounds are normal. She exhibits no distension. There is no tenderness.  Musculoskeletal: Normal range of motion. She exhibits no edema and no tenderness.  Lymphadenopathy:    She has no cervical adenopathy.  Neurological: She is alert and oriented to person, place, and time. Coordination normal.  Skin: Skin is warm and dry. No rash noted. No erythema.  Psychiatric: She has a normal mood and affect. Her behavior is normal. Judgment and thought content normal.    Assessment and Plan

## 2012-06-24 ENCOUNTER — Ambulatory Visit: Payer: Self-pay | Admitting: Internal Medicine

## 2012-07-09 ENCOUNTER — Other Ambulatory Visit: Payer: Self-pay | Admitting: *Deleted

## 2012-07-09 MED ORDER — CLOPIDOGREL BISULFATE 75 MG PO TABS: 75.0000 mg | ORAL_TABLET | Freq: Every day | ORAL | Status: DC

## 2012-07-09 MED ORDER — CLONIDINE HCL 0.1 MG PO TABS: 0.1000 mg | ORAL_TABLET | Freq: Two times a day (BID) | ORAL | Status: DC

## 2012-07-09 NOTE — Telephone Encounter (Signed)
Refilled Clopidogrel and Clonidine 90 day supply sent to [facility].

## 2012-07-12 ENCOUNTER — Ambulatory Visit: Payer: Self-pay | Admitting: Unknown Physician Specialty

## 2012-07-13 LAB — PATHOLOGY REPORT

## 2012-07-25 ENCOUNTER — Ambulatory Visit: Payer: Self-pay | Admitting: Internal Medicine

## 2012-08-05 ENCOUNTER — Other Ambulatory Visit: Payer: Self-pay

## 2012-08-05 LAB — PRO B NATRIURETIC PEPTIDE: B-Type Natriuretic Peptide: 72 pg/mL (ref 0–450)

## 2012-08-11 ENCOUNTER — Ambulatory Visit: Payer: Self-pay | Admitting: Internal Medicine

## 2012-08-11 LAB — IRON AND TIBC
Iron Bind.Cap.(Total): 331 ug/dL (ref 250–450)
Iron: 88 ug/dL (ref 50–170)
Unbound Iron-Bind.Cap.: 243 ug/dL

## 2012-08-11 LAB — CANCER CENTER HEMOGLOBIN: HGB: 13 g/dL (ref 12.0–16.0)

## 2012-08-24 ENCOUNTER — Ambulatory Visit: Payer: Self-pay | Admitting: Internal Medicine

## 2012-09-24 ENCOUNTER — Ambulatory Visit: Payer: Self-pay | Admitting: Internal Medicine

## 2012-09-29 LAB — CBC CANCER CENTER
Basophil #: 0 x10 3/mm (ref 0.0–0.1)
Basophil %: 0.3 %
Eosinophil #: 0.2 x10 3/mm (ref 0.0–0.7)
Eosinophil %: 2.3 %
HCT: 36.9 % (ref 35.0–47.0)
Lymphocyte %: 21.7 %
MCH: 30.5 pg (ref 26.0–34.0)
MCV: 91 fL (ref 80–100)
Monocyte #: 0.8 x10 3/mm (ref 0.2–0.9)
Platelet: 231 x10 3/mm (ref 150–440)
RBC: 4.06 10*6/uL (ref 3.80–5.20)
RDW: 14.7 % — ABNORMAL HIGH (ref 11.5–14.5)
WBC: 8.7 x10 3/mm (ref 3.6–11.0)

## 2012-09-29 LAB — IRON AND TIBC
Iron Bind.Cap.(Total): 325 ug/dL (ref 250–450)
Iron Saturation: 14 %
Iron: 46 ug/dL — ABNORMAL LOW (ref 50–170)
Unbound Iron-Bind.Cap.: 279 ug/dL

## 2012-09-29 LAB — FERRITIN: Ferritin (ARMC): 22 ng/mL (ref 8–388)

## 2012-10-25 ENCOUNTER — Ambulatory Visit: Payer: Self-pay | Admitting: Internal Medicine

## 2012-11-30 ENCOUNTER — Ambulatory Visit: Payer: Self-pay

## 2012-12-07 ENCOUNTER — Encounter: Payer: Self-pay | Admitting: Cardiovascular Disease

## 2012-12-07 ENCOUNTER — Ambulatory Visit (INDEPENDENT_AMBULATORY_CARE_PROVIDER_SITE_OTHER): Payer: Medicare Other | Admitting: Cardiovascular Disease

## 2012-12-07 VITALS — BP 140/60 | HR 66 | Ht 62.0 in | Wt 228.5 lb

## 2012-12-07 DIAGNOSIS — I2510 Atherosclerotic heart disease of native coronary artery without angina pectoris: Secondary | ICD-10-CM

## 2012-12-07 DIAGNOSIS — E785 Hyperlipidemia, unspecified: Secondary | ICD-10-CM

## 2012-12-07 DIAGNOSIS — I10 Essential (primary) hypertension: Secondary | ICD-10-CM

## 2012-12-07 NOTE — Assessment & Plan Note (Signed)
Cholesterol is at goal on the current lipid regimen. No changes to the medications were made.  

## 2012-12-07 NOTE — Assessment & Plan Note (Signed)
Blood pressure is well controlled on today's visit. No changes made to the medications. 

## 2012-12-07 NOTE — Patient Instructions (Signed)
You are doing well. No medication changes were made.  Continue to take lasix as needed of leg swelling  Please call us if you have new issues that need to be addressed before your next appt.  Your physician wants you to follow-up in: 6 months.  You will receive a reminder letter in the mail two months in advance. If you don't receive a letter, please call our office to schedule the follow-up appointment.

## 2012-12-07 NOTE — Progress Notes (Signed)
Patient ID: Amy [name], female    DOB: 09/23/1927, 79 y.o.   MRN: [id number]  HPI Comments: Amy [name] is a pleasant 79 year old woman with a history of coronary artery disease,  cardiac catheterization confirming severe stenosis of her proximal LAD with stent placed, problems with incontinence,  who now presents for routine follow up.   Beta blocker has been held in the past secondary to bradycardia.   Prior episode of shortness of breath while walking in the hospital to schedule a colonoscopy appointment. sent  to the emergency room for further evaluation. Systolic pressure  was in the 170 range. workup at that time was essentially normal and she was discharged home.  She is taking Lasix only periodically for edema and shortness of breath.  Overall she feels well but is not walking very much and is deconditioned.  Today's visit, she reports that she is doing well. She checks her blood pressure occasionally, reports it is  "ok".  She has minimal lower extremity edema. She has received iron transfusions in the past under the direction of Dr. Ma Hillock.   Recent total cholesterol 129 Previous Ferritin 16, iron 28 and iron saturation of 7, hemoglobin 11.7  hemoglobin A1c 7.8, total cholesterol 129, LDL 44  cardiac catheter performed at [facility] on June 22, 2009 details 90% proximal LAD, 40% mid LAD, 40% diagonal, 60% proximal circumflex followed by 40% circumflex lesion, 30%, 40% and 30% lesion noted in the RCA with distal RCA with 30% and 25% lesions. Mild atheroma in the aorta.  Xience 2.75 x 12 mm Drug eluting stent placed.     stress test 01/14/2012 showing no ischemia, Lexiscan   EKG shows normal sinus rhythm with right bundle branch block, rate of 66 beats per minute,  no significant ST-T wave changes      Outpatient Encounter Prescriptions as of 12/07/2012  Medication Sig Dispense Refill  . amLODipine (NORVASC) 10 MG tablet Take 5 mg by mouth daily.      Marland Kitchen aspirin 81 MG  tablet Take 81 mg by mouth daily.      . cloNIDine (CATAPRES) 0.1 MG tablet Take 1 tablet (0.1 mg total) by mouth 2 (two) times daily.  180 tablet  3  . clopidogrel (PLAVIX) 75 MG tablet Take 1 tablet (75 mg total) by mouth daily.  90 tablet  3  . furosemide (LASIX) 20 MG tablet Take 1 tablet (20 mg total) by mouth 2 (two) times daily as needed.  60 tablet  6  . glimepiride (AMARYL) 4 MG tablet Take 4 mg by mouth daily before breakfast.      . levothyroxine (SYNTHROID, LEVOTHROID) 125 MCG tablet Take 125 mcg by mouth daily.      Marland Kitchen losartan-hydrochlorothiazide (HYZAAR) 100-25 MG per tablet Take 1 tablet by mouth daily.        . metFORMIN (GLUCOPHAGE) 850 MG tablet Take 1,000 mg by mouth 2 (two) times daily with a meal.       . Multiple Vitamins-Minerals (PRESERVISION AREDS PO) Take by mouth 2 (two) times daily.      . NON FORMULARY Iron transfusion as needed or directed. [facility].      Marland Kitchen oxybutynin (DITROPAN) 5 MG tablet Take 5 mg by mouth 1 dose over 46 hours.        . simvastatin (ZOCOR) 40 MG tablet Take 1 tablet (40 mg total) by mouth at bedtime.  90 tablet  3  . tolterodine (DETROL) 2 MG tablet Take  2 mg by mouth 1 dose over 46 hours.         No facility-administered encounter medications on file as of 12/07/2012.     Review of Systems  Constitutional: Negative.   HENT: Negative.   Eyes: Negative.   Cardiovascular: Negative.   Gastrointestinal: Negative.   Endocrine: Negative.   Musculoskeletal: Positive for gait problem.  Skin: Negative.   Allergic/Immunologic: Negative.   Neurological: Negative.   Hematological: Negative.   Psychiatric/Behavioral: Negative.   All other systems reviewed and are negative.   BP 140/60  Pulse 66  Ht 5\' 2"  (1.575 m)  Wt 228 lb 8 oz (103.647 kg)  BMI 41.78 kg/m2  Physical Exam  Nursing note and vitals reviewed. Constitutional: She is oriented to person, place, and time. She appears well-developed and well-nourished.  Obese   HENT:  Head: Normocephalic.  Nose: Nose normal.  Mouth/Throat: Oropharynx is clear and moist.  Eyes: Conjunctivae are normal. Pupils are equal, round, and reactive to light.  Neck: Normal range of motion. Neck supple. No JVD present.  Cardiovascular: Normal rate, regular rhythm, S1 normal, S2 normal, normal heart sounds and intact distal pulses.  Exam reveals no gallop and no friction rub.   No murmur heard. Pulmonary/Chest: Effort normal and breath sounds normal. No respiratory distress. She has no wheezes. She has no rales. She exhibits no tenderness.  Abdominal: Soft. Bowel sounds are normal. She exhibits no distension. There is no tenderness.  Musculoskeletal: Normal range of motion. She exhibits no edema and no tenderness.  Lymphadenopathy:    She has no cervical adenopathy.  Neurological: She is alert and oriented to person, place, and time. Coordination normal.  Skin: Skin is warm and dry. No rash noted. No erythema.  Psychiatric: She has a normal mood and affect. Her behavior is normal. Judgment and thought content normal.    Assessment and Plan

## 2012-12-07 NOTE — Assessment & Plan Note (Signed)
Currently with no symptoms of angina. No further workup at this time. Continue current medication regimen. 

## 2012-12-21 ENCOUNTER — Ambulatory Visit: Payer: Self-pay | Admitting: Internal Medicine

## 2012-12-21 LAB — CBC CANCER CENTER
Basophil #: 0 x10 3/mm (ref 0.0–0.1)
Basophil %: 0.5 %
Eosinophil %: 2.4 %
HCT: 36.5 % (ref 35.0–47.0)
HGB: 12.5 g/dL (ref 12.0–16.0)
Lymphocyte #: 1.7 x10 3/mm (ref 1.0–3.6)
MCHC: 34.3 g/dL (ref 32.0–36.0)
MCV: 94 fL (ref 80–100)
Monocyte #: 0.7 x10 3/mm (ref 0.2–0.9)
Monocyte %: 10.5 %
Neutrophil %: 58.5 %
Platelet: 186 x10 3/mm (ref 150–440)
RBC: 3.89 10*6/uL (ref 3.80–5.20)
RDW: 15.3 % — ABNORMAL HIGH (ref 11.5–14.5)
WBC: 6.2 x10 3/mm (ref 3.6–11.0)

## 2012-12-21 LAB — IRON AND TIBC
Iron Bind.Cap.(Total): 289 ug/dL (ref 250–450)
Iron Saturation: 26 %
Iron: 76 ug/dL (ref 50–170)
Unbound Iron-Bind.Cap.: 213 ug/dL

## 2012-12-21 LAB — FERRITIN: Ferritin (ARMC): 63 ng/mL (ref 8–388)

## 2012-12-25 ENCOUNTER — Ambulatory Visit: Payer: Self-pay | Admitting: Internal Medicine

## 2013-01-06 ENCOUNTER — Ambulatory Visit: Payer: Self-pay

## 2013-02-24 HISTORY — PX: COLONOSCOPY: SHX174

## 2013-02-24 HISTORY — PX: UPPER GI ENDOSCOPY: SHX6162

## 2013-04-04 ENCOUNTER — Other Ambulatory Visit: Payer: Self-pay | Admitting: *Deleted

## 2013-04-04 MED ORDER — SIMVASTATIN 40 MG PO TABS: 40.0000 mg | ORAL_TABLET | Freq: Every day | ORAL | Status: DC

## 2013-04-04 MED ORDER — AMLODIPINE BESYLATE 10 MG PO TABS: 5.0000 mg | ORAL_TABLET | Freq: Every day | ORAL | Status: DC

## 2013-04-04 NOTE — Telephone Encounter (Signed)
Requested Prescriptions   Signed Prescriptions Disp Refills  . amLODipine (NORVASC) 10 MG tablet 90 tablet 3    Sig: Take 0.5 tablets (5 mg total) by mouth daily.    Authorizing Provider: Minna Merritts    Ordering User: Britt Bottom simvastatin (ZOCOR) 40 MG tablet 90 tablet 3    Sig: Take 1 tablet (40 mg total) by mouth at bedtime.    Authorizing Provider: Minna Merritts    Ordering User: Britt Bottom

## 2013-04-22 ENCOUNTER — Ambulatory Visit: Payer: Self-pay | Admitting: Internal Medicine

## 2013-04-25 ENCOUNTER — Ambulatory Visit: Payer: Self-pay | Admitting: Internal Medicine

## 2013-04-25 LAB — IRON AND TIBC
IRON BIND. CAP.(TOTAL): 396 ug/dL (ref 250–450)
IRON SATURATION: 6 %
Iron: 22 ug/dL — ABNORMAL LOW (ref 50–170)
Unbound Iron-Bind.Cap.: 374 ug/dL

## 2013-04-25 LAB — FERRITIN: FERRITIN (ARMC): 14 ng/mL (ref 8–388)

## 2013-04-25 LAB — CANCER CENTER HEMOGLOBIN: HGB: 10.6 g/dL — AB (ref 12.0–16.0)

## 2013-05-06 LAB — OCCULT BLOOD X 1 CARD TO LAB, STOOL
OCCULT BLOOD, FECES: POSITIVE
Occult Blood, Feces: POSITIVE

## 2013-05-25 ENCOUNTER — Ambulatory Visit: Payer: Self-pay | Admitting: Internal Medicine

## 2013-06-03 ENCOUNTER — Ambulatory Visit: Payer: Medicare Other | Admitting: Cardiovascular Disease

## 2013-06-07 ENCOUNTER — Ambulatory Visit (INDEPENDENT_AMBULATORY_CARE_PROVIDER_SITE_OTHER): Payer: Medicare Other | Admitting: Cardiovascular Disease

## 2013-06-07 ENCOUNTER — Encounter: Payer: Self-pay | Admitting: Cardiovascular Disease

## 2013-06-07 VITALS — BP 158/54 | HR 70 | Ht 62.0 in | Wt 226.0 lb

## 2013-06-07 DIAGNOSIS — R0602 Shortness of breath: Secondary | ICD-10-CM

## 2013-06-07 DIAGNOSIS — D649 Anemia, unspecified: Secondary | ICD-10-CM

## 2013-06-07 DIAGNOSIS — I10 Essential (primary) hypertension: Secondary | ICD-10-CM

## 2013-06-07 DIAGNOSIS — I2510 Atherosclerotic heart disease of native coronary artery without angina pectoris: Secondary | ICD-10-CM

## 2013-06-07 DIAGNOSIS — E785 Hyperlipidemia, unspecified: Secondary | ICD-10-CM

## 2013-06-07 MED ORDER — FUROSEMIDE 20 MG PO TABS: 20.0000 mg | ORAL_TABLET | Freq: Two times a day (BID) | ORAL | Status: DC | PRN

## 2013-06-07 NOTE — Assessment & Plan Note (Signed)
Currently with no symptoms of angina. No further workup at this time. Continue current medication regimen. 

## 2013-06-07 NOTE — Progress Notes (Signed)
Patient ID: Amy [name], female    DOB: 05/27/1927, 80 y.o.   MRN: [id number]  HPI Comments: Amy [name] is a pleasant 80 year old woman with a history of coronary artery disease,  cardiac catheterization confirming severe stenosis of her proximal LAD with stent placed, problems with incontinence,  who now presents for routine follow up.   Beta blocker has been held in the past secondary to bradycardia.   Prior episode of shortness of breath while walking in the hospital to schedule a colonoscopy appointment. sent  to the emergency room for further evaluation. Systolic pressure  was in the 170 range. workup at that time was essentially normal and she was discharged home.  Past history of edema and shortness of breath.  On today's visit, she reports having slightly worsening edema and shortness of breath. She's not taking Lasix She isnot walking very much and is deconditioned.   She reports that she is guaiac positive. She's not had a colonoscopy recently. Seen by Dr. Vira Agar. She has received iron transfusions in the past under the direction of Dr. Ma Hillock.   Previous Ferritin 16, iron 28 and iron saturation of 7, hemoglobin 11.7  Hemoglobin A1c 7.4, total cholesterol 131  cardiac catheter performed at [facility] on June 22, 2009 details 90% proximal LAD, 40% mid LAD, 40% diagonal, 60% proximal circumflex followed by 40% circumflex lesion, 30%, 40% and 30% lesion noted in the RCA with distal RCA with 30% and 25% lesions. Mild atheroma in the aorta.  Xience 2.75 x 12 mm Drug eluting stent placed.     stress test 01/14/2012 showing no ischemia, Lexiscan   EKG shows normal sinus rhythm with right bundle branch block, rate of 70 beats per minute,  no significant ST-T wave changes      Outpatient Encounter Prescriptions as of 06/07/2013  Medication Sig  . amLODipine (NORVASC) 10 MG tablet Take 0.5 tablets (5 mg total) by mouth daily.  Marland Kitchen aspirin 81 MG tablet Take 81 mg by mouth daily.  .  cloNIDine (CATAPRES - DOSED IN MG/24 HR) 0.2 mg/24hr patch Place 0.2 mg onto the skin once a week.  . clopidogrel (PLAVIX) 75 MG tablet Take 1 tablet (75 mg total) by mouth daily.  . furosemide (LASIX) 20 MG tablet Take 1 tablet (20 mg total) by mouth 2 (two) times daily as needed.  Marland Kitchen glimepiride (AMARYL) 4 MG tablet Take 4 mg by mouth daily before breakfast.  . levothyroxine (SYNTHROID, LEVOTHROID) 125 MCG tablet Take 125 mcg by mouth daily.  Marland Kitchen losartan-hydrochlorothiazide (HYZAAR) 100-25 MG per tablet Take 1 tablet by mouth daily.    . metFORMIN (GLUCOPHAGE) 1000 MG tablet Take 1,000 mg by mouth 2 (two) times daily with a meal.  . Multiple Vitamins-Minerals (PRESERVISION AREDS PO) Take by mouth 2 (two) times daily.  . NON FORMULARY Iron transfusion as needed or directed. [facility].  Marland Kitchen oxybutynin (DITROPAN) 5 MG tablet Take 5 mg by mouth 1 dose over 46 hours.    . simvastatin (ZOCOR) 40 MG tablet Take 1 tablet (40 mg total) by mouth at bedtime.  . tolterodine (DETROL) 2 MG tablet Take 2 mg by mouth 1 dose over 46 hours.      Review of Systems  Constitutional: Negative.   HENT: Negative.   Eyes: Negative.   Respiratory: Positive for shortness of breath.   Cardiovascular: Positive for leg swelling.  Gastrointestinal: Negative.   Endocrine: Negative.   Musculoskeletal: Positive for gait problem.  Skin: Negative.  Allergic/Immunologic: Negative.   Neurological: Negative.   Hematological: Negative.   Psychiatric/Behavioral: Negative.   All other systems reviewed and are negative.  BP 158/54  Pulse 70  Ht 5\' 2"  (1.575 m)  Wt 226 lb (102.513 kg)  BMI 41.33 kg/m2  Physical Exam  Nursing note and vitals reviewed. Constitutional: She is oriented to person, place, and time. She appears well-developed and well-nourished.  Obese  HENT:  Head: Normocephalic.  Nose: Nose normal.  Mouth/Throat: Oropharynx is clear and moist.  Eyes: Conjunctivae are normal. Pupils are  equal, round, and reactive to light.  Neck: Normal range of motion. Neck supple. No JVD present.  Cardiovascular: Normal rate, regular rhythm, S1 normal, S2 normal, normal heart sounds and intact distal pulses.  Exam reveals no gallop and no friction rub.   No murmur heard. Trace bilateral lower extremity edema  Pulmonary/Chest: Effort normal and breath sounds normal. No respiratory distress. She has no wheezes. She has no rales. She exhibits no tenderness.  Abdominal: Soft. Bowel sounds are normal. She exhibits no distension. There is no tenderness.  Musculoskeletal: Normal range of motion. She exhibits no edema and no tenderness.  Lymphadenopathy:    She has no cervical adenopathy.  Neurological: She is alert and oriented to person, place, and time. Coordination normal.  Skin: Skin is warm and dry. No rash noted. No erythema.  Psychiatric: She has a normal mood and affect. Her behavior is normal. Judgment and thought content normal.    Assessment and Plan

## 2013-06-07 NOTE — Assessment & Plan Note (Signed)
Blood pressure is well controlled on today's visit. No changes made to the medications. 

## 2013-06-07 NOTE — Assessment & Plan Note (Signed)
Cholesterol is at goal on the current lipid regimen. No changes to the medications were made.  

## 2013-06-07 NOTE — Assessment & Plan Note (Signed)
Followed by Dr. Ma Hillock. Recent guaiac positive. May need colonoscopy

## 2013-06-07 NOTE — Patient Instructions (Signed)
You are doing well. Please take lasix 1 to 2 pills as  Needed in the morning for shortness of breath  Please call us if you have new issues that need to be addressed before your next appt.  Your physician wants you to follow-up in: 3 months.  You will receive a reminder letter in the mail two months in advance. If you don't receive a letter, please call our office to schedule the follow-up appointment.

## 2013-06-07 NOTE — Assessment & Plan Note (Signed)
Mild chronic shortness of breath. Possibly slightly worse today. Recommended she start taking her Lasix one or 2 a day for symptom relief. Suspect she has chronic mild diastolic CHF

## 2013-06-30 ENCOUNTER — Ambulatory Visit: Payer: Self-pay | Admitting: Unknown Physician Specialty

## 2013-07-01 LAB — PATHOLOGY REPORT

## 2013-07-20 ENCOUNTER — Other Ambulatory Visit: Payer: Self-pay

## 2013-07-20 MED ORDER — CLOPIDOGREL BISULFATE 75 MG PO TABS: 75.0000 mg | ORAL_TABLET | Freq: Every day | ORAL | Status: DC

## 2013-07-20 NOTE — Telephone Encounter (Signed)
Refill sent for plavix 75 mg

## 2013-08-16 ENCOUNTER — Ambulatory Visit: Payer: Self-pay | Admitting: Internal Medicine

## 2013-08-16 LAB — IRON AND TIBC
IRON BIND. CAP.(TOTAL): 381 ug/dL (ref 250–450)
Iron Saturation: 11 %
Iron: 43 ug/dL — ABNORMAL LOW (ref 50–170)
UNBOUND IRON-BIND. CAP.: 338 ug/dL

## 2013-08-16 LAB — CANCER CENTER HEMOGLOBIN: HGB: 12.2 g/dL (ref 12.0–16.0)

## 2013-08-16 LAB — FERRITIN: FERRITIN (ARMC): 19 ng/mL (ref 8–388)

## 2013-08-24 ENCOUNTER — Ambulatory Visit: Payer: Self-pay | Admitting: Internal Medicine

## 2013-09-06 ENCOUNTER — Ambulatory Visit (INDEPENDENT_AMBULATORY_CARE_PROVIDER_SITE_OTHER): Payer: Medicare Other | Admitting: Cardiovascular Disease

## 2013-09-06 ENCOUNTER — Encounter: Payer: Self-pay | Admitting: Cardiovascular Disease

## 2013-09-06 VITALS — BP 140/60 | HR 71 | Ht 62.0 in | Wt 232.2 lb

## 2013-09-06 DIAGNOSIS — I509 Heart failure, unspecified: Secondary | ICD-10-CM

## 2013-09-06 DIAGNOSIS — I5032 Chronic diastolic (congestive) heart failure: Secondary | ICD-10-CM

## 2013-09-06 DIAGNOSIS — I2510 Atherosclerotic heart disease of native coronary artery without angina pectoris: Secondary | ICD-10-CM

## 2013-09-06 DIAGNOSIS — R0602 Shortness of breath: Secondary | ICD-10-CM

## 2013-09-06 DIAGNOSIS — I10 Essential (primary) hypertension: Secondary | ICD-10-CM

## 2013-09-06 DIAGNOSIS — E785 Hyperlipidemia, unspecified: Secondary | ICD-10-CM

## 2013-09-06 MED ORDER — AMLODIPINE BESYLATE 5 MG PO TABS: 5.0000 mg | ORAL_TABLET | Freq: Every day | ORAL | Status: DC

## 2013-09-06 NOTE — Assessment & Plan Note (Signed)
Encouraged her to stay on her simvastatin

## 2013-09-06 NOTE — Assessment & Plan Note (Signed)
Significant edema and shortness of breath. We have encouraged her to take Lasix at least every other day if not daily. She only takes this once a day currently.

## 2013-09-06 NOTE — Assessment & Plan Note (Signed)
Currently with no symptoms of angina. No further workup at this time. Continue current medication regimen. 

## 2013-09-06 NOTE — Patient Instructions (Signed)
You are doing well. No medication changes were made.  Please take the lasix as needed for shortness of breath, You might need it every other day  Please call us if you have new issues that need to be addressed before your next appt.  Your physician wants you to follow-up in: 6 months.  You will receive a reminder letter in the mail two months in advance. If you don't receive a letter, please call our office to schedule the follow-up appointment.

## 2013-09-06 NOTE — Assessment & Plan Note (Signed)
Blood pressure is well controlled on today's visit. No changes made to the medications. 

## 2013-09-06 NOTE — Progress Notes (Signed)
Patient ID: Amy [name], female    DOB: 1927-04-03, 80 y.o.   MRN: [id number]  HPI Comments: Amy [name] is a pleasant 80 year old woman with a history of coronary artery disease,  cardiac catheterization confirming severe stenosis of her proximal LAD with stent placed, problems with incontinence,  who now presents for routine follow up.   Beta blocker has been held in the past secondary to bradycardia.  She continues to struggle after the loss of her daughter.   In followup today, she reports that she's not taking her Lasix, possibly once per week. She continues to have shortness of breath which is a chronic issue. Continued trace leg edema. Is taking amlodipine 5 mg daily. Continues to be weak, does not walk much and is deconditioned Guaiac positive and she recently had colonoscopy and EGD. She reports Dr. Vira Agar did not find anything Iron transfusions in the past under the direction of Dr. Ma Hillock.   Prior episode of shortness of breath while walking in the hospital to schedule a colonoscopy appointment. sent  to the emergency room for further evaluation. Systolic pressure  was in the 170 range. workup at that time was essentially normal and she was discharged home.  Previous Ferritin 16, iron 28 and iron saturation of 7, hemoglobin 11.7  Hemoglobin A1c 7.4, total cholesterol 131  cardiac catheter performed at [facility] on June 22, 2009 details 90% proximal LAD, 40% mid LAD, 40% diagonal, 60% proximal circumflex followed by 40% circumflex lesion, 30%, 40% and 30% lesion noted in the RCA with distal RCA with 30% and 25% lesions. Mild atheroma in the aorta.  Xience 2.75 x 12 mm Drug eluting stent placed.     stress test 01/14/2012 showing no ischemia, Lexiscan   EKG shows normal sinus rhythm with right bundle branch block, rate of 71 beats per minute,  no significant ST-T wave changes      Outpatient Encounter Prescriptions as of 09/06/2013  Medication Sig  . amLODipine (NORVASC) 5  MG tablet Take 1 tablet (5 mg total) by mouth daily.  Marland Kitchen aspirin 81 MG tablet Take 81 mg by mouth daily.  . cloNIDine (CATAPRES - DOSED IN MG/24 HR) 0.2 mg/24hr patch Place 0.2 mg onto the skin once a week.  . clopidogrel (PLAVIX) 75 MG tablet Take 1 tablet (75 mg total) by mouth daily.  . furosemide (LASIX) 20 MG tablet Take 1 tablet (20 mg total) by mouth 2 (two) times daily as needed.  Marland Kitchen glimepiride (AMARYL) 4 MG tablet Take 4 mg by mouth daily before breakfast.  . levothyroxine (SYNTHROID, LEVOTHROID) 125 MCG tablet Take 125 mcg by mouth daily.  Marland Kitchen losartan-hydrochlorothiazide (HYZAAR) 100-25 MG per tablet Take 1 tablet by mouth daily.    . metFORMIN (GLUCOPHAGE) 1000 MG tablet Take 1,000 mg by mouth 2 (two) times daily with a meal.  . Multiple Vitamins-Minerals (PRESERVISION AREDS PO) Take by mouth 2 (two) times daily.  . NON FORMULARY Iron transfusion as needed or directed[facility].  Marland Kitchen oxybutynin (DITROPAN) 5 MG tablet Take 5 mg by mouth 1 dose over 46 hours.    . simvastatin (ZOCOR) 40 MG tablet Take 1 tablet (40 mg total) by mouth at bedtime.  . tolterodine (DETROL) 2 MG tablet Take 2 mg by mouth 1 dose over 46 hours.      Review of Systems  Constitutional: Negative.   HENT: Negative.   Eyes: Negative.   Respiratory: Positive for shortness of breath.   Cardiovascular: Positive for  leg swelling.  Gastrointestinal: Negative.   Endocrine: Negative.   Musculoskeletal: Positive for gait problem.  Skin: Negative.   Allergic/Immunologic: Negative.   Neurological: Negative.   Hematological: Negative.   Psychiatric/Behavioral: Negative.   All other systems reviewed and are negative.  BP 150/60  Pulse 71  Ht 5\' 2"  (1.575 m)  Wt 232 lb 4 oz (105.348 kg)  BMI 42.47 kg/m2  Physical Exam  Nursing note and vitals reviewed. Constitutional: She is oriented to person, place, and time. She appears well-developed and well-nourished.  Obese  HENT:  Head: Normocephalic.   Nose: Nose normal.  Mouth/Throat: Oropharynx is clear and moist.  Eyes: Conjunctivae are normal. Pupils are equal, round, and reactive to light.  Neck: Normal range of motion. Neck supple. No JVD present.  Cardiovascular: Normal rate, regular rhythm, S1 normal, S2 normal, normal heart sounds and intact distal pulses.  Exam reveals no gallop and no friction rub.   No murmur heard. Trace bilateral lower extremity edema  Pulmonary/Chest: Effort normal and breath sounds normal. No respiratory distress. She has no wheezes. She has no rales. She exhibits no tenderness.  Abdominal: Soft. Bowel sounds are normal. She exhibits no distension. There is no tenderness.  Musculoskeletal: Normal range of motion. She exhibits no edema and no tenderness.  Lymphadenopathy:    She has no cervical adenopathy.  Neurological: She is alert and oriented to person, place, and time. Coordination normal.  Skin: Skin is warm and dry. No rash noted. No erythema.  Psychiatric: She has a normal mood and affect. Her behavior is normal. Judgment and thought content normal.    Assessment and Plan

## 2013-09-24 ENCOUNTER — Ambulatory Visit: Payer: Self-pay | Admitting: Internal Medicine

## 2013-10-11 ENCOUNTER — Inpatient Hospital Stay: Payer: Self-pay | Admitting: Internal Medicine

## 2013-10-11 LAB — URINALYSIS, COMPLETE
BILIRUBIN, UR: NEGATIVE
BLOOD: NEGATIVE
Glucose,UR: NEGATIVE mg/dL (ref 0–75)
Hyaline Cast: 5
Ketone: NEGATIVE
LEUKOCYTE ESTERASE: NEGATIVE
Nitrite: NEGATIVE
PH: 5 (ref 4.5–8.0)
Protein: 30
Specific Gravity: 1.025 (ref 1.003–1.030)
Squamous Epithelial: 1
WBC UR: 3 /HPF (ref 0–5)

## 2013-10-11 LAB — LIPASE, BLOOD: LIPASE: 105 U/L (ref 73–393)

## 2013-10-11 LAB — CBC WITH DIFFERENTIAL/PLATELET
Basophil #: 0.1 10*3/uL (ref 0.0–0.1)
Basophil %: 0.5 %
EOS ABS: 0 10*3/uL (ref 0.0–0.7)
Eosinophil %: 0 %
HCT: 41 % (ref 35.0–47.0)
HGB: 13 g/dL (ref 12.0–16.0)
LYMPHS PCT: 4.8 %
Lymphocyte #: 0.5 10*3/uL — ABNORMAL LOW (ref 1.0–3.6)
MCH: 30.8 pg (ref 26.0–34.0)
MCHC: 31.8 g/dL — ABNORMAL LOW (ref 32.0–36.0)
MCV: 97 fL (ref 80–100)
MONO ABS: 0.8 x10 3/mm (ref 0.2–0.9)
Monocyte %: 7.3 %
Neutrophil #: 9.6 10*3/uL — ABNORMAL HIGH (ref 1.4–6.5)
Neutrophil %: 87.4 %
PLATELETS: 175 10*3/uL (ref 150–440)
RBC: 4.24 10*6/uL (ref 3.80–5.20)
RDW: 18.9 % — ABNORMAL HIGH (ref 11.5–14.5)
WBC: 11 10*3/uL (ref 3.6–11.0)

## 2013-10-11 LAB — COMPREHENSIVE METABOLIC PANEL
ANION GAP: 11 (ref 7–16)
Albumin: 2.8 g/dL — ABNORMAL LOW (ref 3.4–5.0)
Alkaline Phosphatase: 69 U/L
BILIRUBIN TOTAL: 0.2 mg/dL (ref 0.2–1.0)
BUN: 38 mg/dL — ABNORMAL HIGH (ref 7–18)
CALCIUM: 7.3 mg/dL — AB (ref 8.5–10.1)
CHLORIDE: 103 mmol/L (ref 98–107)
CREATININE: 1.86 mg/dL — AB (ref 0.60–1.30)
Co2: 23 mmol/L (ref 21–32)
EGFR (African American): 28 — ABNORMAL LOW
EGFR (Non-African Amer.): 24 — ABNORMAL LOW
Glucose: 238 mg/dL — ABNORMAL HIGH (ref 65–99)
Osmolality: 291 (ref 275–301)
Potassium: 4.3 mmol/L (ref 3.5–5.1)
SGOT(AST): 30 U/L (ref 15–37)
SGPT (ALT): 30 U/L
SODIUM: 137 mmol/L (ref 136–145)
Total Protein: 6.2 g/dL — ABNORMAL LOW (ref 6.4–8.2)

## 2013-10-11 LAB — CK TOTAL AND CKMB (NOT AT ARMC)
CK, TOTAL: 160 U/L
CK-MB: 0.5 ng/mL (ref 0.5–3.6)

## 2013-10-11 LAB — TROPONIN I
Troponin-I: <0.02 ng/mL
Troponin-I: 0.03 ng/mL

## 2013-10-11 LAB — PRO B NATRIURETIC PEPTIDE: B-TYPE NATIURETIC PEPTID: 525 pg/mL — AB (ref 0–450)

## 2013-10-11 LAB — MAGNESIUM: Magnesium: 1.4 mg/dL — ABNORMAL LOW

## 2013-10-12 DIAGNOSIS — I517 Cardiomegaly: Secondary | ICD-10-CM

## 2013-10-12 LAB — CBC WITH DIFFERENTIAL/PLATELET
BASOS PCT: 0.2 %
Basophil #: 0 10*3/uL (ref 0.0–0.1)
Eosinophil #: 0 10*3/uL (ref 0.0–0.7)
Eosinophil %: 0.1 %
HCT: 39 % (ref 35.0–47.0)
HGB: 12.6 g/dL (ref 12.0–16.0)
LYMPHS ABS: 1.2 10*3/uL (ref 1.0–3.6)
LYMPHS PCT: 13.4 %
MCH: 31.3 pg (ref 26.0–34.0)
MCHC: 32.3 g/dL (ref 32.0–36.0)
MCV: 97 fL (ref 80–100)
Monocyte #: 0.7 x10 3/mm (ref 0.2–0.9)
Monocyte %: 7.1 %
NEUTROS PCT: 79.2 %
Neutrophil #: 7.3 10*3/uL — ABNORMAL HIGH (ref 1.4–6.5)
PLATELETS: 146 10*3/uL — AB (ref 150–440)
RBC: 4.03 10*6/uL (ref 3.80–5.20)
RDW: 18.8 % — AB (ref 11.5–14.5)
WBC: 9.2 10*3/uL (ref 3.6–11.0)

## 2013-10-12 LAB — CK TOTAL AND CKMB (NOT AT ARMC)
CK, TOTAL: 286 U/L — AB
CK-MB: 1 ng/mL (ref 0.5–3.6)

## 2013-10-12 LAB — BASIC METABOLIC PANEL
Anion Gap: 11 (ref 7–16)
BUN: 43 mg/dL — ABNORMAL HIGH (ref 7–18)
CHLORIDE: 106 mmol/L (ref 98–107)
CREATININE: 1.86 mg/dL — AB (ref 0.60–1.30)
Calcium, Total: 7.2 mg/dL — ABNORMAL LOW (ref 8.5–10.1)
Co2: 22 mmol/L (ref 21–32)
EGFR (African American): 28 — ABNORMAL LOW
EGFR (Non-African Amer.): 24 — ABNORMAL LOW
GLUCOSE: 150 mg/dL — AB (ref 65–99)
OSMOLALITY: 291 (ref 275–301)
Potassium: 4.2 mmol/L (ref 3.5–5.1)
Sodium: 139 mmol/L (ref 136–145)

## 2013-10-12 LAB — TROPONIN I: Troponin-I: 0.03 ng/mL

## 2013-10-12 LAB — CLOSTRIDIUM DIFFICILE(ARMC)

## 2013-10-13 LAB — BASIC METABOLIC PANEL
ANION GAP: 8 (ref 7–16)
BUN: 34 mg/dL — ABNORMAL HIGH (ref 7–18)
CO2: 22 mmol/L (ref 21–32)
Calcium, Total: 7.6 mg/dL — ABNORMAL LOW (ref 8.5–10.1)
Chloride: 110 mmol/L — ABNORMAL HIGH (ref 98–107)
Creatinine: 1.39 mg/dL — ABNORMAL HIGH (ref 0.60–1.30)
EGFR (African American): 40 — ABNORMAL LOW
EGFR (Non-African Amer.): 35 — ABNORMAL LOW
Glucose: 61 mg/dL — ABNORMAL LOW (ref 65–99)
Osmolality: 285 (ref 275–301)
Potassium: 3.5 mmol/L (ref 3.5–5.1)
SODIUM: 140 mmol/L (ref 136–145)

## 2013-10-13 LAB — WBCS, STOOL

## 2013-10-13 LAB — CBC WITH DIFFERENTIAL/PLATELET
Basophil #: 0 10*3/uL (ref 0.0–0.1)
Basophil %: 0.2 %
Eosinophil #: 0 10*3/uL (ref 0.0–0.7)
Eosinophil %: 0.4 %
HCT: 38.6 % (ref 35.0–47.0)
HGB: 12.6 g/dL (ref 12.0–16.0)
LYMPHS PCT: 13.4 %
Lymphocyte #: 0.8 10*3/uL — ABNORMAL LOW (ref 1.0–3.6)
MCH: 31.1 pg (ref 26.0–34.0)
MCHC: 32.6 g/dL (ref 32.0–36.0)
MCV: 96 fL (ref 80–100)
MONO ABS: 0.5 x10 3/mm (ref 0.2–0.9)
MONOS PCT: 8.8 %
NEUTROS PCT: 77.2 %
Neutrophil #: 4.8 10*3/uL (ref 1.4–6.5)
PLATELETS: 146 10*3/uL — AB (ref 150–440)
RBC: 4.05 10*6/uL (ref 3.80–5.20)
RDW: 18.4 % — ABNORMAL HIGH (ref 11.5–14.5)
WBC: 6.2 10*3/uL (ref 3.6–11.0)

## 2013-10-13 LAB — URINE CULTURE

## 2013-10-14 LAB — STOOL CULTURE

## 2013-10-15 LAB — BASIC METABOLIC PANEL
Anion Gap: 8 (ref 7–16)
BUN: 12 mg/dL (ref 7–18)
CREATININE: 1.1 mg/dL (ref 0.60–1.30)
Calcium, Total: 7.5 mg/dL — ABNORMAL LOW (ref 8.5–10.1)
Chloride: 113 mmol/L — ABNORMAL HIGH (ref 98–107)
Co2: 23 mmol/L (ref 21–32)
EGFR (African American): 53 — ABNORMAL LOW
GFR CALC NON AF AMER: 46 — AB
GLUCOSE: 141 mg/dL — AB (ref 65–99)
Osmolality: 289 (ref 275–301)
Potassium: 3.9 mmol/L (ref 3.5–5.1)
SODIUM: 144 mmol/L (ref 136–145)

## 2013-10-15 LAB — CBC WITH DIFFERENTIAL/PLATELET
BASOS ABS: 0 10*3/uL (ref 0.0–0.1)
BASOS PCT: 0.6 %
Eosinophil #: 0.2 10*3/uL (ref 0.0–0.7)
Eosinophil %: 3 %
HCT: 33.4 % — ABNORMAL LOW (ref 35.0–47.0)
HGB: 10.8 g/dL — ABNORMAL LOW (ref 12.0–16.0)
LYMPHS PCT: 23.8 %
Lymphocyte #: 1.3 10*3/uL (ref 1.0–3.6)
MCH: 31.1 pg (ref 26.0–34.0)
MCHC: 32.5 g/dL (ref 32.0–36.0)
MCV: 96 fL (ref 80–100)
MONOS PCT: 12.9 %
Monocyte #: 0.7 x10 3/mm (ref 0.2–0.9)
NEUTROS PCT: 59.7 %
Neutrophil #: 3.3 10*3/uL (ref 1.4–6.5)
PLATELETS: 153 10*3/uL (ref 150–440)
RBC: 3.48 10*6/uL — AB (ref 3.80–5.20)
RDW: 17.9 % — AB (ref 11.5–14.5)
WBC: 5.5 10*3/uL (ref 3.6–11.0)

## 2013-10-16 LAB — CULTURE, BLOOD (SINGLE)

## 2013-10-18 LAB — HEMOGLOBIN A1C: HEMOGLOBIN A1C: 8.6 % — AB (ref 4.2–6.3)

## 2013-12-14 ENCOUNTER — Ambulatory Visit: Payer: Self-pay

## 2013-12-20 ENCOUNTER — Ambulatory Visit: Payer: Self-pay | Admitting: Internal Medicine

## 2013-12-20 LAB — CBC CANCER CENTER
Basophil #: 0 x10 3/mm (ref 0.0–0.1)
Basophil %: 0.3 %
Eosinophil #: 0.2 x10 3/mm (ref 0.0–0.7)
Eosinophil %: 4 %
HCT: 39.8 % (ref 35.0–47.0)
HGB: 13 g/dL (ref 12.0–16.0)
Lymphocyte #: 2 x10 3/mm (ref 1.0–3.6)
Lymphocyte %: 33 %
MCH: 30.6 pg (ref 26.0–34.0)
MCHC: 32.5 g/dL (ref 32.0–36.0)
MCV: 94 fL (ref 80–100)
MONO ABS: 0.6 x10 3/mm (ref 0.2–0.9)
MONOS PCT: 9.9 %
Neutrophil #: 3.2 x10 3/mm (ref 1.4–6.5)
Neutrophil %: 52.8 %
PLATELETS: 210 x10 3/mm (ref 150–440)
RBC: 4.24 10*6/uL (ref 3.80–5.20)
RDW: 15 % — ABNORMAL HIGH (ref 11.5–14.5)
WBC: 6.1 x10 3/mm (ref 3.6–11.0)

## 2013-12-20 LAB — IRON AND TIBC
Iron Bind.Cap.(Total): 341 ug/dL (ref 250–450)
Iron Saturation: 24 %
Iron: 83 ug/dL (ref 50–170)
UNBOUND IRON-BIND. CAP.: 258 ug/dL

## 2013-12-20 LAB — FERRITIN: FERRITIN (ARMC): 27 ng/mL (ref 8–388)

## 2013-12-25 ENCOUNTER — Ambulatory Visit: Payer: Self-pay | Admitting: Internal Medicine

## 2014-03-06 ENCOUNTER — Ambulatory Visit (INDEPENDENT_AMBULATORY_CARE_PROVIDER_SITE_OTHER): Payer: Medicare Other | Admitting: Cardiovascular Disease

## 2014-03-06 ENCOUNTER — Encounter: Payer: Self-pay | Admitting: Cardiovascular Disease

## 2014-03-06 VITALS — BP 144/60 | HR 71 | Ht 62.0 in | Wt 229.5 lb

## 2014-03-06 DIAGNOSIS — I2510 Atherosclerotic heart disease of native coronary artery without angina pectoris: Secondary | ICD-10-CM

## 2014-03-06 DIAGNOSIS — R0602 Shortness of breath: Secondary | ICD-10-CM | POA: Diagnosis not present

## 2014-03-06 DIAGNOSIS — I10 Essential (primary) hypertension: Secondary | ICD-10-CM

## 2014-03-06 DIAGNOSIS — I5032 Chronic diastolic (congestive) heart failure: Secondary | ICD-10-CM

## 2014-03-06 DIAGNOSIS — E785 Hyperlipidemia, unspecified: Secondary | ICD-10-CM | POA: Diagnosis not present

## 2014-03-06 NOTE — Patient Instructions (Addendum)
You are doing well. No medication changes were made.  Please take lasix as needed for shortness of breath, leg swelling  Please call us if you have new issues that need to be addressed before your next appt.  Your physician wants you to follow-up in: 6 months.  You will receive a reminder letter in the mail two months in advance. If you don't receive a letter, please call our office to schedule the follow-up appointment.

## 2014-03-06 NOTE — Assessment & Plan Note (Signed)
Currently with no symptoms of angina. No further workup at this time. Continue current medication regimen. 

## 2014-03-06 NOTE — Assessment & Plan Note (Signed)
Encouraged her to stay on her simvastatin

## 2014-03-06 NOTE — Assessment & Plan Note (Signed)
Blood pressure is well controlled on today's visit. No changes made to the medications. 

## 2014-03-06 NOTE — Assessment & Plan Note (Signed)
We have recommended that she take Lasix at least every other day if not daily for her diastolic CHF symptoms.

## 2014-03-06 NOTE — Assessment & Plan Note (Addendum)
Shortness of breath likely multifactorial including morbid obesity, deconditioning, diastolic CHF. Suggested she take her Lasix on a more regular basis.

## 2014-03-06 NOTE — Progress Notes (Signed)
Patient ID: Amy [name], female    DOB: [birth year], 81 y.o.   MRN: [id number]  HPI Comments: Amy [name] is a pleasant 81 year old woman with a history of coronary artery disease,  cardiac catheterization confirming severe stenosis of her proximal LAD with stent placed, problems with incontinence,  who now presents for routine follow up of her coronary artery disease Beta blocker has been held in the past secondary to bradycardia.  She continues to struggle after the loss of her daughter.   In follow-up today, she reports that she has mild swelling of her lower extremities. Diet has been poor, hemoglobin A1c 7.4 TSH 10.8. She reports that her thyroid medication dosing was changed by primary care Total cholesterol in March 2015 was 131 She does have shortness of breath with exertion. No regular exercise.  EKG on today's visit shows normal sinus rhythm with right bundle branch block, rate 71 bpm  Hospital records reviewed from August 2015. She had diarrhea, vomiting, felt to be viral also with acute renal failure that improved with hydration. Creatinine reached 1.86, one month later creatinine 1.0 as an outpatient Continues to be weak, does not walk much and is deconditioned  Other past medical history  Guaiac positive and she recently had colonoscopy and EGD. She reports Dr. Vira Agar did not find anything Iron transfusions in the past under the direction of Dr. Ma Hillock.   Prior episode of shortness of breath while walking in the hospital to schedule a colonoscopy appointment. sent  to the emergency room for further evaluation. Systolic pressure  was in the 170 range. workup at that time was essentially normal and she was discharged home.  cardiac catheter performed at [facility] on June 22, 2009 details 90% proximal LAD, 40% mid LAD, 40% diagonal, 60% proximal circumflex followed by 40% circumflex lesion, 30%, 40% and 30% lesion noted in the RCA with distal RCA with 30% and 25% lesions.  Mild atheroma in the aorta.  Xience 2.75 x 12 mm Drug eluting stent placed.     stress test 01/14/2012 showing no ischemia, Lexiscan    Allergies  Allergen Reactions  . Codeine     Rash, difficulty breathing, nausea.    Outpatient Encounter Prescriptions as of 03/06/2014  Medication Sig  . amLODipine (NORVASC) 5 MG tablet Take 1 tablet (5 mg total) by mouth daily.  Marland Kitchen aspirin 81 MG tablet Take 81 mg by mouth daily.  . cloNIDine (CATAPRES - DOSED IN MG/24 HR) 0.2 mg/24hr patch Place 0.2 mg onto the skin once a week.  . clopidogrel (PLAVIX) 75 MG tablet Take 1 tablet (75 mg total) by mouth daily.  . furosemide (LASIX) 20 MG tablet Take 1 tablet (20 mg total) by mouth 2 (two) times daily as needed.  Marland Kitchen glimepiride (AMARYL) 4 MG tablet Take 4 mg by mouth daily before breakfast.  . levothyroxine (SYNTHROID, LEVOTHROID) 125 MCG tablet Take 125 mcg by mouth daily.  Marland Kitchen losartan-hydrochlorothiazide (HYZAAR) 100-25 MG per tablet Take 1 tablet by mouth daily.    . metFORMIN (GLUCOPHAGE) 1000 MG tablet Take 1,000 mg by mouth 2 (two) times daily with a meal.  . Multiple Vitamins-Minerals (PRESERVISION AREDS PO) Take by mouth 2 (two) times daily.  . NON FORMULARY Iron transfusion as needed or directed. [facility].  Marland Kitchen oxybutynin (DITROPAN) 5 MG tablet Take 5 mg by mouth 1 dose over 46 hours.    . simvastatin (ZOCOR) 40 MG tablet Take 1 tablet (40 mg total) by mouth at bedtime.  Marland Kitchen  tolterodine (DETROL) 2 MG tablet Take 2 mg by mouth 1 dose over 46 hours.      Past Medical History  Diagnosis Date  . Diabetes mellitus     Type II  . Thyroid disease     hypothyroidism  . Hypertension   . Hiatal hernia   . Menopausal symptoms   . Bladder incontinence   . Degenerative arthritis of knee     bilateral knees  . Iron deficiency     Past Surgical History  Procedure Laterality Date  . Replacement total knee bilateral    . Total vaginal hysterectomy      ovarian mass, not cancerous  .  Upper gi endoscopy  2015  . Colonoscopy  2015    Social History  reports that she has never smoked. She does not have any smokeless tobacco history on file. She reports that she does not drink alcohol or use illicit drugs.  Family History Family history is unknown by patient.   Review of Systems  Constitutional: Negative.   Respiratory: Positive for shortness of breath.   Cardiovascular: Positive for leg swelling.  Gastrointestinal: Negative.   Musculoskeletal: Positive for gait problem.  Neurological: Negative.   Hematological: Negative.   Psychiatric/Behavioral: Negative.   All other systems reviewed and are negative.  BP 144/60 mmHg  Pulse 71  Ht 5\' 2"  (1.575 m)  Wt 229 lb 8 oz (104.101 kg)  BMI 41.97 kg/m2  Physical Exam  Constitutional: She is oriented to person, place, and time. She appears well-developed and well-nourished.  Obese  HENT:  Head: Normocephalic.  Nose: Nose normal.  Mouth/Throat: Oropharynx is clear and moist.  Eyes: Conjunctivae are normal. Pupils are equal, round, and reactive to light.  Neck: Normal range of motion. Neck supple. No JVD present.  Cardiovascular: Normal rate, regular rhythm, S1 normal, S2 normal, normal heart sounds and intact distal pulses.  Exam reveals no gallop and no friction rub.   No murmur heard. Trace pitting edema to the mid shins  Pulmonary/Chest: Effort normal and breath sounds normal. No respiratory distress. She has no wheezes. She has no rales. She exhibits no tenderness.  Abdominal: Soft. Bowel sounds are normal. She exhibits no distension. There is no tenderness.  Musculoskeletal: Normal range of motion. She exhibits edema. She exhibits no tenderness.  Lymphadenopathy:    She has no cervical adenopathy.  Neurological: She is alert and oriented to person, place, and time. Coordination normal.  Skin: Skin is warm and dry. No rash noted. No erythema.  Psychiatric: She has a normal mood and affect. Her behavior is  normal. Judgment and thought content normal.    Assessment and Plan  Nursing note and vitals reviewed.

## 2014-03-27 DIAGNOSIS — H3532 Exudative age-related macular degeneration: Secondary | ICD-10-CM | POA: Diagnosis not present

## 2014-04-25 ENCOUNTER — Ambulatory Visit
Admit: 2014-04-25 | Disposition: A | Discharge status: Home / Self Care | Payer: Self-pay | Attending: Internal Medicine | Admitting: Internal Medicine

## 2014-04-25 DIAGNOSIS — Z79899 Other long term (current) drug therapy: Secondary | ICD-10-CM | POA: Diagnosis not present

## 2014-04-25 DIAGNOSIS — D509 Iron deficiency anemia, unspecified: Secondary | ICD-10-CM | POA: Diagnosis not present

## 2014-04-27 DIAGNOSIS — Z79899 Other long term (current) drug therapy: Secondary | ICD-10-CM | POA: Diagnosis not present

## 2014-04-27 DIAGNOSIS — D509 Iron deficiency anemia, unspecified: Secondary | ICD-10-CM | POA: Diagnosis not present

## 2014-04-29 DIAGNOSIS — Z79899 Other long term (current) drug therapy: Secondary | ICD-10-CM | POA: Diagnosis not present

## 2014-04-29 DIAGNOSIS — D509 Iron deficiency anemia, unspecified: Secondary | ICD-10-CM | POA: Diagnosis not present

## 2014-05-04 DIAGNOSIS — K922 Gastrointestinal hemorrhage, unspecified: Secondary | ICD-10-CM | POA: Diagnosis not present

## 2014-05-04 DIAGNOSIS — R195 Other fecal abnormalities: Secondary | ICD-10-CM | POA: Diagnosis not present

## 2014-05-04 DIAGNOSIS — D500 Iron deficiency anemia secondary to blood loss (chronic): Secondary | ICD-10-CM | POA: Diagnosis not present

## 2014-05-08 DIAGNOSIS — E039 Hypothyroidism, unspecified: Secondary | ICD-10-CM | POA: Diagnosis not present

## 2014-05-08 DIAGNOSIS — E785 Hyperlipidemia, unspecified: Secondary | ICD-10-CM | POA: Diagnosis not present

## 2014-05-08 DIAGNOSIS — I10 Essential (primary) hypertension: Secondary | ICD-10-CM | POA: Diagnosis not present

## 2014-05-08 DIAGNOSIS — E119 Type 2 diabetes mellitus without complications: Secondary | ICD-10-CM | POA: Diagnosis not present

## 2014-05-10 DIAGNOSIS — D500 Iron deficiency anemia secondary to blood loss (chronic): Secondary | ICD-10-CM | POA: Diagnosis not present

## 2014-05-10 DIAGNOSIS — R195 Other fecal abnormalities: Secondary | ICD-10-CM | POA: Diagnosis not present

## 2014-05-11 DIAGNOSIS — D509 Iron deficiency anemia, unspecified: Secondary | ICD-10-CM | POA: Diagnosis not present

## 2014-05-11 DIAGNOSIS — Z79899 Other long term (current) drug therapy: Secondary | ICD-10-CM | POA: Diagnosis not present

## 2014-05-16 DIAGNOSIS — I10 Essential (primary) hypertension: Secondary | ICD-10-CM | POA: Diagnosis not present

## 2014-05-16 DIAGNOSIS — E119 Type 2 diabetes mellitus without complications: Secondary | ICD-10-CM | POA: Diagnosis not present

## 2014-05-16 DIAGNOSIS — I2510 Atherosclerotic heart disease of native coronary artery without angina pectoris: Secondary | ICD-10-CM | POA: Diagnosis not present

## 2014-05-16 DIAGNOSIS — D500 Iron deficiency anemia secondary to blood loss (chronic): Secondary | ICD-10-CM | POA: Diagnosis not present

## 2014-05-23 DIAGNOSIS — Z23 Encounter for immunization: Secondary | ICD-10-CM | POA: Diagnosis not present

## 2014-05-24 DIAGNOSIS — H3532 Exudative age-related macular degeneration: Secondary | ICD-10-CM | POA: Diagnosis not present

## 2014-05-26 ENCOUNTER — Ambulatory Visit
Admit: 2014-05-26 | Disposition: A | Discharge status: Home / Self Care | Payer: Self-pay | Attending: Internal Medicine | Admitting: Internal Medicine

## 2014-06-17 NOTE — Discharge Summary (Signed)
PATIENT NAME:  Amy [name], Amy [name] MR#:  [id number] DATE OF BIRTH:  1927-06-22  DATE OF ADMISSION:  10/11/2013 DATE OF DISCHARGE:  10/15/2013  DISCHARGE DIAGNOSES: 1.  Pneumonia.  2.  Diarrhea and vomiting, likely viral. 3.  Anasarca.  4.  Acute renal failure improved.  5.  Hypertension.  6.  Hyperlipidemia.  7.  Hypothyroidism.  8.  Diabetes.   CONDITION ON DISCHARGE: Stable.   CODE STATUS: DNR.  MEDICATIONS ON DISCHARGE: 1.  Levothyroxine 125 mcg oral once a day.  2.  Metformin 1000 mg oral 2 times a day.  3.  Simvastatin 40 mg oral once a day.  4.  Amlodipine 10 mg oral once a day.  5.  Aspirin 81 mg once a day. 6.  Clonidine 0.1 mg patch once a week.  7.  Plavix 75 mg once a day.  8.  Glimepiride 2 mg oral 2 times a day.  9.  Oxybutynin 5 mg oral tablet once a day.  10.  Furosemide 20 mg oral tablet once a day.  11.  Metronidazole 500 mg oral tablet every 8 hours for 2 days.  12.  Losartan 100 mg oral once a day.  13.  Lactobacilli 2 times a day.  14.  Levofloxacin 750 mg oral tablet every 48 hours for 5 days.   DISCHARGE INSTRUCTIONS:  Advised to home health physical therapy.   DIET ON DISCHARGE: Low-sodium, low-fat, low-cholesterol carbohydrate-controlled ADA diet. Diet consistency: Regular.   ACTIVITY: As tolerated.   TIMEFRAME TO FOLLOWUP: Within 1-2 weeks with PMD.   HISTORY OF PRESENT ILLNESS: An 81 year old female presented with 3 days', history of diarrhea, nausea and vomiting. Also had low grade fever, feeling short of breath and fatigue. She was noted to have pneumonia on chest x-ray and has some low-grade fever on admission.   HOSPITAL COURSE AND STAY:  1.  She was admitted with a diagnosis of pneumonia and started on antibiotic Levaquin.  Also had complained of diarrhea and vomiting, so she was started on Flagyl for that and stool culture and KUB were done. KUB did not show any obstruction or dilation. She did fine with supportive care of antiemetics.   Stool culture was negative for any pathogens and her symptoms resolved after 3-4 days of therapy and so she was discharged without any further therapy for these symptoms.  2.  For pneumonia initially, she was requiring oxygen, but later on with treatment, she walked fine on room air and so she was discharged without any supplemental oxygen. Blood cultures  remained negative.  3.  Acute renal failure. This was present on admission likely, prerenal.  We held her diuretics and had improvement in her kidney function with IV fluid.  4.  Pedal edema, likely it was due to venous insufficiency. Echocardiogram was normal and lower extremity Doppler studies were negative. We held Lasix initially, but then later on with overall improvement in health, she had good improvement.  5.  Hypertension. Initially medications were held because of pneumonia and renal failure, but had significant improvement in renal function and blood pressure was staying high, so gradually added back her home medications.  6.  Hypothyroidism.  Continued Synthroid.  7.  Diabetes. Continued glipizide and sliding scale coverage.  8.  Chronic kidney disease.  Continued monitoring.  IMPORTANT LABORATORY RESULTS IN THE HOSPITAL: WBC count 11, hemoglobin is 13, platelet count 175,000, MCV is 97. Serum glucose 238, creatinine 1.86, sodium 137, potassium 4.3, chloride 103, CO2 23, calcium 7.3.  Chest x-ray, portable, showed enlarged cardiac silhouette with pulmonary vascular congestion. Blood cultures remained negative up to 48 hours, which were collected on admission. Urinalysis was negative.  C. difficile was negative. Stool culture was negative.  [place] color Doppler for lower extremity negative for DVT. X-ray KUB showed nonobstructive bowel gas pattern.   TOTAL TIME SPENT ON THIS DISCHARGE: 40 minutes.     ____________________________ Ceasar Lund Anselm Jungling, MD vgv:DT D: 10/15/2013 14:08:38 ET T: 10/15/2013 15:27:34  ET JOB#: [id number]  cc: Ceasar Lund. Anselm Jungling, MD, <Dictator> Cheral Marker. Ola Spurr, MD Rosalio [facility] MD ELECTRONICALLY SIGNED 10/30/2013 9:15

## 2014-06-17 NOTE — H&P (Signed)
PATIENT NAME:  Amy [name], Amy [name] MR#:  [id number] DATE OF BIRTH:  [birth year]  DATE OF ADMISSION:  10/11/2013  PRIMARY CARE PHYSICIAN: Dr. Ola Spurr.   CHIEF COMPLAINT: Weakness and shortness of breath.   HISTORY OF PRESENT ILLNESS: An 81 year old female who presented with 3 days of diarrhea and nausea. No vomiting.  Also low grade fever, feeling short of breath and fatigue. She is noted to have pneumonia on chest x-ray, had a low-grade fever here.   REVIEW OF SYSTEMS:  CONSTITUTIONAL: Positive fever. Positive chills. Positive fatigue and weakness. EYES: No blurred or double vision.  EARS, NOSE, AND THROAT: No ear pain, epistaxis, discharge, or snoring.   RESPIRATORY:  Positive cough. No wheezing and hemoptysis. Positive shortness of breath.  CARDIOVASCULAR: Denies any chest pain, palpitations, or orthopnea. She has chronic left lower extremity edema.  GASTROINTESTINAL: Positive nausea. No vomiting. Positive diarrhea. No melena or abdominal pain.  GENITOURINARY:  Dysuria or hematuria. ENDOCRINE: No polyuria or polydipsia. HEMATOLOGIC AND LYMPHATICS:  No bleeding or swollen glands.  SKIN: No rash or lesions.  MUSCULOSKELETAL: The patient is very weak at this time, but usually she has no limited activities.  NEUROLOGIC: No history of CVA, TIA, or seizures.  PSYCHIATRIC:  No symptoms of anxiety or depression.   PAST MEDICAL HISTORY:  1.  Peripheral neuropathy.  2.  Hyperlipidemia.  3.  Glaucoma.  4.  Coronary artery disease with a stent.  5.  Type 2 diabetes.  6.  Arthritis.  7.  Urinary incontinence.  8.  Hypothyroidism.    9.  Hypertension.   PAST SURGICAL HISTORY:  1.  Bilateral knee replacements.  2.  Cardiac stents.   FAMILY HISTORY: The patient cannot recall family history.   ALLERGIES:  1.  ATENOLOL, BRADYCARDIA.   2.  NARCOTICS. 3.  OPIATES. 4.  TAPE.   MEDICATIONS:  1.  Norvasc 10 mg half tablet daily.  2.  Aspirin 81 mg daily.  3.  Clonidine patch 0.1 mg  weekly on Thursdays.  4.  Plavix 75 mg daily. 5.  Lasix 20 mg daily.  6.  Glimepiride 2 mg b.i.d.  7.  Hydrochlorothiazide/losartan 25/100 daily.  8.  Synthroid 125 mcg daily. 9.  Metformin 1000 b.i.d.  10.  Oxybutynin 5 mg daily.  11.  PreserVision b.i.d.  12.  Simvastatin 40 mg at bedtime.   SOCIAL HISTORY: No tobacco, alcohol or drug use.   PHYSICAL EXAMINATION:  VITAL SIGNS: Temperature 100.5, pulse 76, respirations 25, blood pressure 132/48, 95% on 2 liters.  GENERAL: The patient is alert, oriented, in moderate distress.  HEENT: Head is atraumatic. Pupils are reactive. Sclerae anicteric. Mucous membranes are dry. Oropharynx is clear.  NECK: Supple. No JVD, carotid bruit, or enlarged thyroid.  CARDIOVASCULAR: A 2/6 systolic ejection murmur heard best at the left sternal border without radiation.  PMI is hard to palpate due to body habitus.  LUNGS: Decreased breath sounds throughout without any crackles, rales, rhonchi, or wheezing. Normal percussion. No egophony.  BACK: No CVA or vertebral tenderness.  ABDOMEN:  Obese. Bowel sounds are positive. Nontender, nondistended. No hepatosplenomegaly.  EXTREMITIES: No clubbing or cyanosis.  She has very minimal edema in the left leg, which she says is chronic.  NEUROLOGIC: Cranial nerves II-XII are grossly intact. No focal deficits.  SKIN: Without rash or lesions.   LABORATORY DATA: Sodium 137, potassium 4.3, chloride 103, bicarbonate 23, BUN 38, creatinine 1.86, glucose 238, total protein 6.2, albumin 2.8, bilirubin 0.2, alkaline phosphatase 69, AST 30, ALT  30, white blood cells 11, hemoglobin 13, hematocrit 41, platelets 175,000. Urinalysis is negative for LC or nitrites. Lactic acid is 2.3. Chest x-ray shows enlargement of the cardiac silhouette with some pulmonary vascular congestion.   EKG shows normal sinus rhythm with a right bundle branch block.   ASSESSMENT AND PLAN: This 81 year old female presents with 3 days of diarrhea and  nausea, found to have a fever of 100.5. Chest x-ray with probable pneumonia versus vascular congestion who presents also with shortness of breath.   1.  Shortness of breath likely secondary to pneumonia in the setting of fever, cough, and weakness. The patient was started on Levaquin, broad-spectrum antibiotics has been ordered. I also have ordered an echocardiogram to evaluate for signs of congestive heart failure along with a BNP.   2.  Acute kidney injury. Her creatinine is elevated from her previous creatinine. I will hold any nephrotoxic agents, provide very gentle hydration as it is unclear at this time if patient is actually having congestive heart failure exacerbation or is just dehydrated from her diarrhea. We will monitor creatinine, In's and Out's, and daily weight.   3.  Hypothyroidism. We will continue on Synthroid.   4.  History of coronary artery disease. Will continue on aspirin, Plavix, and statin medication.   5.  Lower extremity edema. The patient says this is chronic, but due to her shortness of breath, will also order Doppler to rule out a deep vein thrombosis.   6.  The patient is a DO NOT RESUSCITATE STATUS.   TIME SPENT: Approximately 50 minutes.     ____________________________ Donell Beers. Benjie Karvonen, MD spm:TT D: 10/11/2013 19:15:39 ET T: 10/11/2013 19:41:24 ET JOB#: [id number]  cc: Maram Bently P. Benjie Karvonen, MD, <Dictator> Cheral Marker. Ola Spurr, MD Ulice Bold P Ayinde Swim MD ELECTRONICALLY SIGNED 10/11/2013 20:25

## 2014-07-09 DIAGNOSIS — J209 Acute bronchitis, unspecified: Secondary | ICD-10-CM | POA: Diagnosis not present

## 2014-07-14 DIAGNOSIS — H3532 Exudative age-related macular degeneration: Secondary | ICD-10-CM | POA: Diagnosis not present

## 2014-07-26 ENCOUNTER — Other Ambulatory Visit: Payer: Self-pay | Admitting: Cardiovascular Disease

## 2014-08-15 DIAGNOSIS — D500 Iron deficiency anemia secondary to blood loss (chronic): Secondary | ICD-10-CM | POA: Diagnosis not present

## 2014-08-15 DIAGNOSIS — E119 Type 2 diabetes mellitus without complications: Secondary | ICD-10-CM | POA: Diagnosis not present

## 2014-08-18 ENCOUNTER — Other Ambulatory Visit: Payer: Self-pay

## 2014-08-18 DIAGNOSIS — D649 Anemia, unspecified: Secondary | ICD-10-CM

## 2014-08-21 ENCOUNTER — Inpatient Hospital Stay: Payer: Medicare Other | Attending: Internal Medicine

## 2014-08-21 DIAGNOSIS — D509 Iron deficiency anemia, unspecified: Secondary | ICD-10-CM | POA: Diagnosis not present

## 2014-08-21 DIAGNOSIS — D649 Anemia, unspecified: Secondary | ICD-10-CM

## 2014-08-21 LAB — IRON AND TIBC
IRON: 65 ug/dL (ref 28–170)
Saturation Ratios: 20 % (ref 10.4–31.8)
TIBC: 328 ug/dL (ref 250–450)
UIBC: 263 ug/dL

## 2014-08-21 LAB — HEMOGLOBIN: HEMOGLOBIN: 13.5 g/dL (ref 12.0–16.0)

## 2014-08-21 LAB — FERRITIN: FERRITIN: 30 ng/mL (ref 11–307)

## 2014-08-22 DIAGNOSIS — E785 Hyperlipidemia, unspecified: Secondary | ICD-10-CM | POA: Diagnosis not present

## 2014-08-22 DIAGNOSIS — E1165 Type 2 diabetes mellitus with hyperglycemia: Secondary | ICD-10-CM | POA: Insufficient documentation

## 2014-08-22 DIAGNOSIS — E039 Hypothyroidism, unspecified: Secondary | ICD-10-CM | POA: Diagnosis not present

## 2014-08-22 DIAGNOSIS — I2510 Atherosclerotic heart disease of native coronary artery without angina pectoris: Secondary | ICD-10-CM | POA: Diagnosis not present

## 2014-08-29 ENCOUNTER — Other Ambulatory Visit: Payer: Medicare Other

## 2014-09-11 ENCOUNTER — Telehealth: Payer: Self-pay | Admitting: Internal Medicine
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
## 2014-09-13 NOTE — Telephone Encounter (Signed)
Called pt and hgb 13 and iron studies within normal limits and much better than the numbers in the past. I gave her the exact numbers and she was happy to get good results

## 2014-09-18 DIAGNOSIS — H3532 Exudative age-related macular degeneration: Secondary | ICD-10-CM | POA: Diagnosis not present

## 2014-10-19 ENCOUNTER — Other Ambulatory Visit: Payer: Self-pay | Admitting: Cardiovascular Disease

## 2014-11-13 DIAGNOSIS — H3532 Exudative age-related macular degeneration: Secondary | ICD-10-CM | POA: Diagnosis not present

## 2014-12-04 DIAGNOSIS — E1165 Type 2 diabetes mellitus with hyperglycemia: Secondary | ICD-10-CM | POA: Diagnosis not present

## 2014-12-04 DIAGNOSIS — I2510 Atherosclerotic heart disease of native coronary artery without angina pectoris: Secondary | ICD-10-CM | POA: Diagnosis not present

## 2014-12-04 DIAGNOSIS — I10 Essential (primary) hypertension: Secondary | ICD-10-CM | POA: Diagnosis not present

## 2014-12-04 DIAGNOSIS — D500 Iron deficiency anemia secondary to blood loss (chronic): Secondary | ICD-10-CM | POA: Diagnosis not present

## 2014-12-05 ENCOUNTER — Encounter: Payer: Self-pay | Admitting: Cardiovascular Disease

## 2014-12-05 ENCOUNTER — Ambulatory Visit (INDEPENDENT_AMBULATORY_CARE_PROVIDER_SITE_OTHER): Payer: Medicare Other | Admitting: Cardiovascular Disease

## 2014-12-05 VITALS — BP 140/60 | HR 85 | Ht 62.0 in | Wt 233.5 lb

## 2014-12-05 DIAGNOSIS — E785 Hyperlipidemia, unspecified: Secondary | ICD-10-CM

## 2014-12-05 DIAGNOSIS — Z23 Encounter for immunization: Secondary | ICD-10-CM

## 2014-12-05 DIAGNOSIS — I209 Angina pectoris, unspecified: Secondary | ICD-10-CM | POA: Diagnosis not present

## 2014-12-05 DIAGNOSIS — IMO0002 Reserved for concepts with insufficient information to code with codable children: Secondary | ICD-10-CM

## 2014-12-05 DIAGNOSIS — E1159 Type 2 diabetes mellitus with other circulatory complications: Secondary | ICD-10-CM

## 2014-12-05 DIAGNOSIS — I10 Essential (primary) hypertension: Secondary | ICD-10-CM | POA: Diagnosis not present

## 2014-12-05 DIAGNOSIS — I739 Peripheral vascular disease, unspecified: Secondary | ICD-10-CM

## 2014-12-05 DIAGNOSIS — R0602 Shortness of breath: Secondary | ICD-10-CM | POA: Diagnosis not present

## 2014-12-05 DIAGNOSIS — E1165 Type 2 diabetes mellitus with hyperglycemia: Secondary | ICD-10-CM | POA: Insufficient documentation

## 2014-12-05 DIAGNOSIS — I2510 Atherosclerotic heart disease of native coronary artery without angina pectoris: Secondary | ICD-10-CM

## 2014-12-05 DIAGNOSIS — I5032 Chronic diastolic (congestive) heart failure: Secondary | ICD-10-CM | POA: Diagnosis not present

## 2014-12-05 DIAGNOSIS — I779 Disorder of arteries and arterioles, unspecified: Secondary | ICD-10-CM

## 2014-12-05 MED ORDER — FUROSEMIDE 20 MG PO TABS: 20.0000 mg | ORAL_TABLET | Freq: Two times a day (BID) | ORAL | Status: DC | PRN

## 2014-12-05 NOTE — Assessment & Plan Note (Signed)
Blood pressure is well controlled on today's visit. No changes made to the medications. 

## 2014-12-05 NOTE — Assessment & Plan Note (Signed)
We have encouraged continued exercise, careful diet management in an effort to lose weight. 

## 2014-12-05 NOTE — Assessment & Plan Note (Signed)
Stressed importance of weight loss, strict diet Will defer management to primary care

## 2014-12-05 NOTE — Assessment & Plan Note (Signed)
Recommended she stay on her Lasix. She is only taking this several days per week She may need to take this on a more regular basis. Trace edema on today's visit, shortness of breath.

## 2014-12-05 NOTE — Assessment & Plan Note (Signed)
Currently with no symptoms of angina. No further workup at this time. Continue current medication regimen. 

## 2014-12-05 NOTE — Progress Notes (Signed)
Patient ID: Amy [name], female    DOB: 1927-06-21, 81 y.o.   MRN: [id number]  HPI Comments: Amy [name] is a pleasant 81 year old woman with a history of coronary artery disease,  cardiac catheterization confirming severe stenosis of her proximal LAD with stent placed, problems with incontinence,  who now presents for routine follow up of her coronary artery disease Beta blocker has been held in the past secondary to bradycardia.   lost  her daughter, previous adjustment disorder  In follow-up, she reports that she is doing relatively well. Continues to struggle with her weight.  mild swelling of her lower extremities. Takes Lasix several days per week, not every day Reports her blood pressure has been relatively well-controlled She is concerned about her prior history of carotid arterial disease She does have shortness of breath with exertion. No regular exercise.  EKG on today's visit shows normal sinus rhythm with right bundle branch block, rate 70s bpm  Other past medical history Previous hemoglobin A1c 7.4 TSH 10.8. She reports that her thyroid medication dosing was changed by primary care Total cholesterol in March 2015 was [facility] records reviewed from August 2015. She had diarrhea, vomiting, felt to be viral also with acute renal failure that improved with hydration. Creatinine reached 1.86, one month later creatinine 1.0 as an outpatient Continues to be weak, does not walk much and is deconditioned  Guaiac positive and she had colonoscopy and EGD. She reports Dr. Vira Agar did not find anything Iron transfusions in the past under the direction of Dr. Ma Hillock.   Prior episode of shortness of breath while walking in the hospital to schedule a colonoscopy appointment. sent  to the emergency room for further evaluation. Systolic pressure  was in the 170 range. workup at that time was essentially normal and she was discharged home.  cardiac catheter performed at [facility]  on June 22, 2009 details 90% proximal LAD, 40% mid LAD, 40% diagonal, 60% proximal circumflex followed by 40% circumflex lesion, 30%, 40% and 30% lesion noted in the RCA with distal RCA with 30% and 25% lesions. Mild atheroma in the aorta.  Xience 2.75 x 12 mm Drug eluting stent placed.     stress test 01/14/2012 showing no ischemia, Lexiscan    Allergies  Allergen Reactions  . Codeine     Rash, difficulty breathing, nausea.    Outpatient Encounter Prescriptions as of 12/05/2014  Medication Sig  . amLODipine (NORVASC) 5 MG tablet TAKE ONE (1) TABLET BY MOUTH EVERY DAY FOR BLOOD PRESSURE  . aspirin 81 MG tablet Take 81 mg by mouth daily.  . cloNIDine (CATAPRES - DOSED IN MG/24 HR) 0.2 mg/24hr patch Place 0.2 mg onto the skin once a week.  . clopidogrel (PLAVIX) 75 MG tablet Take 1 tablet (75 mg total) by mouth daily.  . furosemide (LASIX) 20 MG tablet Take 1 tablet (20 mg total) by mouth 2 (two) times daily as needed.  Marland Kitchen glimepiride (AMARYL) 4 MG tablet Take 4 mg by mouth daily before breakfast.  . levothyroxine (SYNTHROID, LEVOTHROID) 125 MCG tablet Take 125 mcg by mouth daily.  Marland Kitchen losartan-hydrochlorothiazide (HYZAAR) 100-25 MG per tablet Take 1 tablet by mouth daily.    . metFORMIN (GLUCOPHAGE) 1000 MG tablet Take 1,000 mg by mouth 2 (two) times daily with a meal.  . Multiple Vitamins-Minerals (PRESERVISION AREDS PO) Take by mouth 2 (two) times daily.  . NON FORMULARY Iron transfusion as needed or directed[facility].  Marland Kitchen oxybutynin (DITROPAN)  5 MG tablet Take 5 mg by mouth 1 dose over 46 hours.    . simvastatin (ZOCOR) 40 MG tablet TAKE ONE TABLET BY MOUTH EVERY NIGHT AT BEDTIME  . tolterodine (DETROL) 2 MG tablet Take 2 mg by mouth 1 dose over 46 hours.    . [DISCONTINUED] furosemide (LASIX) 20 MG tablet Take 1 tablet (20 mg total) by mouth 2 (two) times daily as needed.   No facility-administered encounter medications on file as of 12/05/2014.    Past Medical  History  Diagnosis Date  . Diabetes mellitus     Type II  . Thyroid disease     hypothyroidism  . Hypertension   . Hiatal hernia   . Menopausal symptoms   . Bladder incontinence   . Degenerative arthritis of knee     bilateral knees  . Iron deficiency     Past Surgical History  Procedure Laterality Date  . Replacement total knee bilateral    . Total vaginal hysterectomy      ovarian mass, not cancerous  . Upper gi endoscopy  2015  . Colonoscopy  2015    Social History  reports that she has never smoked. She does not have any smokeless tobacco history on file. She reports that she does not drink alcohol or use illicit drugs.  Family History Family history is unknown by patient.   Review of Systems  Constitutional: Negative.   Respiratory: Positive for shortness of breath.   Cardiovascular: Positive for leg swelling.  Gastrointestinal: Negative.   Musculoskeletal: Positive for gait problem.  Neurological: Negative.   Hematological: Negative.   Psychiatric/Behavioral: Negative.   All other systems reviewed and are negative.  BP 140/60 mmHg  Pulse 85  Ht '5\' 2"'$  (1.575 m)  Wt 233 lb 8 oz (105.915 kg)  BMI 42.70 kg/m2  SpO2 99%  Physical Exam  Constitutional: She is oriented to person, place, and time. She appears well-developed and well-nourished.  Obese  HENT:  Head: Normocephalic.  Nose: Nose normal.  Mouth/Throat: Oropharynx is clear and moist.  Eyes: Conjunctivae are normal. Pupils are equal, round, and reactive to light.  Neck: Normal range of motion. Neck supple. No JVD present.  Cardiovascular: Normal rate, regular rhythm, S1 normal, S2 normal, normal heart sounds and intact distal pulses.  Exam reveals no gallop and no friction rub.   No murmur heard. Trace pitting edema to the mid shins  Pulmonary/Chest: Effort normal and breath sounds normal. No respiratory distress. She has no wheezes. She has no rales. She exhibits no tenderness.  Abdominal: Soft.  Bowel sounds are normal. She exhibits no distension. There is no tenderness.  Musculoskeletal: Normal range of motion. She exhibits edema. She exhibits no tenderness.  Lymphadenopathy:    She has no cervical adenopathy.  Neurological: She is alert and oriented to person, place, and time. Coordination normal.  Skin: Skin is warm and dry. No rash noted. No erythema.  Psychiatric: She has a normal mood and affect. Her behavior is normal. Judgment and thought content normal.    Assessment and Plan  Nursing note and vitals reviewed.

## 2014-12-05 NOTE — Patient Instructions (Addendum)
You are doing well. No medication changes were made.  We will check carotid ultrasound for known carotid disease  ______________________________________________  Take extra lasix for any shortness of breath, leg swelling  Please call us if you have new issues that need to be addressed before your next appt.  Your physician wants you to follow-up in: 6 months.  You will receive a reminder letter in the mail two months in advance. If you don't receive a letter, please call our office to schedule the follow-up appointment.   Carotid Artery Disease The carotid arteries are the two main arteries on either side of the neck that supply blood to the brain. Carotid artery disease, also called carotid artery stenosis, is the narrowing or blockage of one or both carotid arteries. Carotid artery disease increases your risk for a stroke or a transient ischemic attack (TIA). A TIA is an episode in which a waxy, fatty substance that accumulates within the artery (plaque) blocks blood flow to the brain. A TIA is considered a "warning stroke."  CAUSES   Buildup of plaque inside the carotid arteries (atherosclerosis) (common).  A weakened outpouching in an artery (aneurysm).  Inflammation of the carotid artery (arteritis).  A fibrous growth within the carotid artery (fibromuscular dysplasia).  Tissue death within the carotid artery due to radiation treatment (post-radiation necrosis).  Decreased blood flow due to spasms of the carotid artery (vasospasm).  Separation of the walls of the carotid artery (carotid dissection). RISK FACTORS  High cholesterol (dyslipidemia).   High blood pressure (hypertension).   Smoking.   Obesity.   Diabetes.   Family history of cardiovascular disease.   Inactivity or lack of regular exercise.   Being female. Men have an increased risk of developing atherosclerosis earlier in life than women.  SYMPTOMS  Carotid artery disease does not cause  symptoms. DIAGNOSIS Diagnosis of carotid artery disease may include:   A physical exam. Your health care provider may hear an abnormal sound (bruit) when listening to the carotid arteries.   Specific tests that look at the blood flow in the carotid arteries. These tests include:   Carotid artery ultrasonography.   Carotid or cerebral angiography.   Computerized tomographic angiography (CTA).   Magnetic resonance angiography (MRA).  TREATMENT  Treatment of carotid artery disease can include a combination of treatments. Treatment options include:  Surgery. You may have:   A carotid endarterectomy. This is a surgery to remove the blockages in the carotid arteries.   A carotid angioplasty with stenting. This is a nonsurgical interventional procedure. A wire mesh (stent) is used to widen the blocked carotid arteries.   Medicines to control blood pressure, cholesterol, and reduce blood clotting (antiplatelet therapy).   Adjusting your diet.   Lifestyle changes such as:   Quitting smoking.   Exercising as tolerated or as directed by your health care provider.   Controlling and maintaining a good blood pressure.   Keeping cholesterol levels under control.  HOME CARE INSTRUCTIONS   Take medicines only as directed by your health care provider. Make sure you understand all your medicine instructions. Do not stop your medicines without talking to your health care provider.   Follow your health care provider's diet instructions. It is important to eat a healthy diet that is low in saturated fats and includes plenty of fresh fruits, vegetables, and lean meats. High-fat, high-sodium foods as well as foods that are fried, overly processed, or have poor nutritional value should be avoided.  Maintain a healthy  weight.   Stay physically active. It is recommended that you get at least 30 minutes of activity every day.   Do not use any tobacco products including  cigarettes, chewing tobacco, or electronic cigarettes. If you need help quitting, ask your health care provider.  Limit alcohol use to:   No more than 2 drinks per day for men.   No more than 1 drink per day for nonpregnant women.   Do not use illegal drugs.   Keep all follow-up visits as directed by your health care provider.  SEEK IMMEDIATE MEDICAL CARE IF:  You develop TIA or stroke symptoms. These include:   Sudden weakness or numbness on one side of the body, such as in the face, arm, or leg.   Sudden confusion.   Trouble speaking (aphasia) or understanding.   Sudden trouble seeing out of one or both eyes.   Sudden trouble walking.   Dizziness or feeling like you might faint.   Loss of balance or coordination.   Sudden severe headache with no known cause.   Sudden trouble swallowing (dysphagia).  If you have any of these symptoms, call your local emergency services (911 in [place] Do not drive yourself to the clinic or hospital. This is a medical emergency.    This information is not intended to replace advice given to you by your health care provider. Make sure you discuss any questions you have with your health care provider.   Document Released: 05/05/2011 Document Revised: 03/03/2014 Document Reviewed: 08/11/2012 Elsevier Interactive Patient Education [employer].

## 2014-12-05 NOTE — Assessment & Plan Note (Signed)
Encouraged her to take Lasix daily Extra Lasix as needed for worsening leg edema or shortness of breath Also very deconditioned. Recommended she start a regular walking program Stressed the importance of weight loss

## 2014-12-05 NOTE — Assessment & Plan Note (Signed)
Stable symptoms at this time. No further testing

## 2014-12-05 NOTE — Assessment & Plan Note (Signed)
Cholesterol management by primary care Encouraged her to stay on her simvastatin

## 2014-12-11 ENCOUNTER — Other Ambulatory Visit: Payer: Self-pay | Admitting: Infectious Diseases

## 2014-12-11 DIAGNOSIS — I10 Essential (primary) hypertension: Secondary | ICD-10-CM | POA: Diagnosis not present

## 2014-12-11 DIAGNOSIS — Z1231 Encounter for screening mammogram for malignant neoplasm of breast: Secondary | ICD-10-CM

## 2014-12-11 DIAGNOSIS — D500 Iron deficiency anemia secondary to blood loss (chronic): Secondary | ICD-10-CM | POA: Diagnosis not present

## 2014-12-11 DIAGNOSIS — E1165 Type 2 diabetes mellitus with hyperglycemia: Secondary | ICD-10-CM | POA: Diagnosis not present

## 2014-12-11 DIAGNOSIS — I2510 Atherosclerotic heart disease of native coronary artery without angina pectoris: Secondary | ICD-10-CM | POA: Diagnosis not present

## 2014-12-18 ENCOUNTER — Other Ambulatory Visit: Payer: Self-pay | Admitting: Oncology

## 2014-12-18 DIAGNOSIS — D649 Anemia, unspecified: Secondary | ICD-10-CM

## 2014-12-19 ENCOUNTER — Ambulatory Visit
Admission: RE | Admit: 2014-12-19 | Discharge: 2014-12-19 | Disposition: A | Discharge status: Home / Self Care | Payer: Medicare Other | Source: Ambulatory Visit | Attending: Infectious Diseases | Admitting: Infectious Diseases

## 2014-12-19 ENCOUNTER — Inpatient Hospital Stay: Payer: Medicare Other | Admitting: Oncology

## 2014-12-19 ENCOUNTER — Other Ambulatory Visit: Payer: Self-pay | Admitting: Infectious Diseases

## 2014-12-19 ENCOUNTER — Inpatient Hospital Stay: Payer: Medicare Other

## 2014-12-19 DIAGNOSIS — Z1231 Encounter for screening mammogram for malignant neoplasm of breast: Secondary | ICD-10-CM

## 2014-12-28 ENCOUNTER — Ambulatory Visit
Admission: RE | Admit: 2014-12-28 | Discharge: 2014-12-28 | Disposition: A | Discharge status: Home / Self Care | Payer: Medicare Other | Source: Ambulatory Visit | Attending: Oncology | Admitting: Oncology

## 2014-12-28 ENCOUNTER — Inpatient Hospital Stay: Payer: Medicare Other | Attending: Oncology

## 2014-12-28 ENCOUNTER — Inpatient Hospital Stay (HOSPITAL_BASED_OUTPATIENT_CLINIC_OR_DEPARTMENT_OTHER): Payer: Medicare Other | Admitting: Oncology

## 2014-12-28 VITALS — BP 187/65 | HR 76 | Temp 96.8°F | Resp 20 | Wt 246.7 lb

## 2014-12-28 DIAGNOSIS — R32 Unspecified urinary incontinence: Secondary | ICD-10-CM | POA: Insufficient documentation

## 2014-12-28 DIAGNOSIS — R5383 Other fatigue: Secondary | ICD-10-CM

## 2014-12-28 DIAGNOSIS — R531 Weakness: Secondary | ICD-10-CM | POA: Insufficient documentation

## 2014-12-28 DIAGNOSIS — Z7982 Long term (current) use of aspirin: Secondary | ICD-10-CM | POA: Diagnosis not present

## 2014-12-28 DIAGNOSIS — K449 Diaphragmatic hernia without obstruction or gangrene: Secondary | ICD-10-CM

## 2014-12-28 DIAGNOSIS — I10 Essential (primary) hypertension: Secondary | ICD-10-CM

## 2014-12-28 DIAGNOSIS — D649 Anemia, unspecified: Secondary | ICD-10-CM

## 2014-12-28 DIAGNOSIS — R0602 Shortness of breath: Secondary | ICD-10-CM | POA: Insufficient documentation

## 2014-12-28 DIAGNOSIS — Z79899 Other long term (current) drug therapy: Secondary | ICD-10-CM | POA: Insufficient documentation

## 2014-12-28 DIAGNOSIS — M179 Osteoarthritis of knee, unspecified: Secondary | ICD-10-CM | POA: Insufficient documentation

## 2014-12-28 DIAGNOSIS — Z803 Family history of malignant neoplasm of breast: Secondary | ICD-10-CM | POA: Diagnosis not present

## 2014-12-28 DIAGNOSIS — J45909 Unspecified asthma, uncomplicated: Secondary | ICD-10-CM | POA: Diagnosis not present

## 2014-12-28 DIAGNOSIS — Z7984 Long term (current) use of oral hypoglycemic drugs: Secondary | ICD-10-CM

## 2014-12-28 DIAGNOSIS — D509 Iron deficiency anemia, unspecified: Secondary | ICD-10-CM

## 2014-12-28 DIAGNOSIS — R05 Cough: Secondary | ICD-10-CM | POA: Insufficient documentation

## 2014-12-28 DIAGNOSIS — E119 Type 2 diabetes mellitus without complications: Secondary | ICD-10-CM | POA: Insufficient documentation

## 2014-12-28 DIAGNOSIS — R918 Other nonspecific abnormal finding of lung field: Secondary | ICD-10-CM

## 2014-12-28 DIAGNOSIS — E669 Obesity, unspecified: Secondary | ICD-10-CM | POA: Diagnosis not present

## 2014-12-28 DIAGNOSIS — R042 Hemoptysis: Secondary | ICD-10-CM

## 2014-12-28 DIAGNOSIS — M5134 Other intervertebral disc degeneration, thoracic region: Secondary | ICD-10-CM

## 2014-12-28 DIAGNOSIS — E039 Hypothyroidism, unspecified: Secondary | ICD-10-CM | POA: Diagnosis not present

## 2014-12-28 LAB — IRON AND TIBC
Iron: 41 ug/dL (ref 28–170)
SATURATION RATIOS: 11 % (ref 10.4–31.8)
TIBC: 384 ug/dL (ref 250–450)
UIBC: 343 ug/dL

## 2014-12-28 LAB — CBC WITH DIFFERENTIAL/PLATELET
BASOS ABS: 0 10*3/uL (ref 0–0.1)
Basophils Relative: 0 %
Eosinophils Absolute: 0.2 10*3/uL (ref 0–0.7)
Eosinophils Relative: 3 %
HEMATOCRIT: 39.1 % (ref 35.0–47.0)
Hemoglobin: 12.7 g/dL (ref 12.0–16.0)
LYMPHS PCT: 28 %
Lymphs Abs: 2 10*3/uL (ref 1.0–3.6)
MCH: 29.4 pg (ref 26.0–34.0)
MCHC: 32.5 g/dL (ref 32.0–36.0)
MCV: 90.5 fL (ref 80.0–100.0)
Monocytes Absolute: 0.8 10*3/uL (ref 0.2–0.9)
Monocytes Relative: 12 %
NEUTROS ABS: 4 10*3/uL (ref 1.4–6.5)
NEUTROS PCT: 57 %
Platelets: 269 10*3/uL (ref 150–440)
RBC: 4.32 MIL/uL (ref 3.80–5.20)
RDW: 13.6 % (ref 11.5–14.5)
WBC: 7 10*3/uL (ref 3.6–11.0)

## 2014-12-28 LAB — FERRITIN: Ferritin: 25 ng/mL (ref 11–307)

## 2014-12-28 NOTE — Progress Notes (Signed)
Patient is feeling fatigued with SOBr.

## 2015-01-01 ENCOUNTER — Ambulatory Visit (INDEPENDENT_AMBULATORY_CARE_PROVIDER_SITE_OTHER): Payer: Medicare Other

## 2015-01-01 DIAGNOSIS — I779 Disorder of arteries and arterioles, unspecified: Secondary | ICD-10-CM

## 2015-01-01 DIAGNOSIS — R0602 Shortness of breath: Secondary | ICD-10-CM | POA: Diagnosis not present

## 2015-01-01 DIAGNOSIS — I739 Peripheral vascular disease, unspecified: Secondary | ICD-10-CM

## 2015-01-05 NOTE — Progress Notes (Signed)
Amy [name]  [phone] [phone]) [phone]  ID: Amy [name] OB: 11/25/1927  MR#: [id number]  XBM#:[id number]  Patient Care Team: Adrian Prows, MD as PCP - General (Infectious Diseases)  CHIEF COMPLAINT:  Chief Complaint  Patient presents with  . Anemia    INTERVAL HISTORY: Patient has not been evaluated in clinic since October 2015. She returns today with complaints of increasing shortness of breath and weakness and fatigue. She also states she had one episode of hemoptysis earlier. She otherwise feels well. She has no neurologic complaints. She denies any recent fevers or illnesses. She has a chronic cough that is unchanged. She denies any nausea, vomiting, constipation, or diarrhea. She has no urinary complaints. Patient otherwise feels well and offers no further specific complaints.  REVIEW OF SYSTEMS:   Review of Systems  Constitutional: Positive for malaise/fatigue.  Respiratory: Positive for cough, hemoptysis and shortness of breath.   Cardiovascular: Negative for chest pain.  Gastrointestinal: Negative.   Musculoskeletal: Negative.   Neurological: Positive for weakness.    As per HPI. Otherwise, a complete review of systems is negatve.  PAST MEDICAL HISTORY: Past Medical History  Diagnosis Date  . Diabetes mellitus     Type II  . Thyroid disease     hypothyroidism  . Hypertension   . Hiatal hernia   . Menopausal symptoms   . Bladder incontinence   . Degenerative arthritis of knee     bilateral knees  . Iron deficiency     PAST SURGICAL HISTORY: Past Surgical History  Procedure Laterality Date  . Replacement total knee bilateral    . Total vaginal hysterectomy      ovarian mass, not cancerous  . Upper gi endoscopy  2015  . Colonoscopy  2015    FAMILY HISTORY Family History  Problem Relation Age of Onset  . Breast cancer Mother        ADVANCED DIRECTIVES:    HEALTH MAINTENANCE: Social History  Substance  Use Topics  . Smoking status: Never Smoker   . Smokeless tobacco: Not on file  . Alcohol Use: No     Colonoscopy:  PAP:  Bone density:  Lipid panel:  Allergies  Allergen Reactions  . Codeine     Rash, difficulty breathing, nausea.    Current Outpatient Prescriptions  Medication Sig Dispense Refill  . amLODipine (NORVASC) 5 MG tablet TAKE ONE (1) TABLET BY MOUTH EVERY DAY FOR BLOOD PRESSURE 90 tablet 3  . aspirin 81 MG tablet Take 81 mg by mouth daily.    . cloNIDine (CATAPRES - DOSED IN MG/24 HR) 0.3 mg/24hr patch     . clopidogrel (PLAVIX) 75 MG tablet Take 1 tablet (75 mg total) by mouth daily. 90 tablet 3  . furosemide (LASIX) 20 MG tablet Take 1 tablet (20 mg total) by mouth 2 (two) times daily as needed. 180 tablet 3  . glimepiride (AMARYL) 4 MG tablet Take 4 mg by mouth daily before breakfast.    . levothyroxine (SYNTHROID, LEVOTHROID) 125 MCG tablet Take 125 mcg by mouth daily.    Marland Kitchen losartan-hydrochlorothiazide (HYZAAR) 100-25 MG per tablet Take 1 tablet by mouth daily.      . metFORMIN (GLUCOPHAGE) 1000 MG tablet Take 1,000 mg by mouth 2 (two) times daily with a meal.    . Multiple Vitamins-Minerals (PRESERVISION AREDS PO) Take by mouth 2 (two) times daily.    Marland Kitchen oxybutynin (DITROPAN) 5 MG tablet Take 5 mg by mouth 1 dose over 46 hours.      Marland Kitchen  pantoprazole (PROTONIX) 40 MG tablet Take by mouth.    . pioglitazone (ACTOS) 15 MG tablet Take by mouth.    . simvastatin (ZOCOR) 40 MG tablet TAKE ONE TABLET BY MOUTH EVERY NIGHT AT BEDTIME 90 tablet 3  . SYMBICORT 160-4.5 MCG/ACT inhaler      No current facility-administered medications for this visit.    OBJECTIVE: Filed Vitals:   12/28/14 1019  BP: 187/65  Pulse: 76  Temp: 96.8 F (36 C)  Resp: 20     Body mass index is 45.11 kg/(m^2).    ECOG FS:1 - Symptomatic but completely ambulatory  General: Well-developed, well-nourished, no acute distress. Eyes: Pink conjunctiva, anicteric sclera. Lungs: Scattered  wheezing. Heart: Regular rate and rhythm. No rubs, murmurs, or gallops. Abdomen: Soft, nontender, nondistended. No organomegaly noted, normoactive bowel sounds. Musculoskeletal: No edema, cyanosis, or clubbing. Neuro: Alert, answering all questions appropriately. Cranial nerves grossly intact. Skin: No rashes or petechiae noted. Psych: Normal affect.  LAB RESULTS:  Lab Results  Component Value Date   NA 144 10/15/2013   K 3.9 10/15/2013   CL 113* 10/15/2013   CO2 23 10/15/2013   GLUCOSE 141* 10/15/2013   BUN 12 10/15/2013   CREATININE 1.10 10/15/2013   CALCIUM 7.5* 10/15/2013   PROT 6.2* 10/11/2013   ALBUMIN 2.8* 10/11/2013   AST 30 10/11/2013   ALT 30 10/11/2013   ALKPHOS 69 10/11/2013   BILITOT 0.2 10/11/2013   GFRNONAA 46* 10/15/2013   GFRAA 53* 10/15/2013    Lab Results  Component Value Date   WBC 7.0 12/28/2014   NEUTROABS 4.0 12/28/2014   HGB 12.7 12/28/2014   HCT 39.1 12/28/2014   MCV 90.5 12/28/2014   PLT 269 12/28/2014     STUDIES: Dg Chest 2 View  12/28/2014  CLINICAL DATA:  Hemoptysis this morning ; shortness of breath on exertion, history of asthma, chronic CHF, obesity, and diabetes. EXAM: CHEST  2 VIEW COMPARISON:  Portable chest x-ray of October 11, 2013 FINDINGS: The lungs are well-expanded. There is no focal infiltrate. The interstitial markings are coarse. The heart is top-normal in size. The pulmonary vascularity is not engorged. There is no pleural effusion. There is a moderate-sized hiatal hernia. There is apical pleural thickening bilaterally. There is mild degenerative disc space narrowing of the mid thoracic spine. IMPRESSION: Acute on chronic bronchitic change.  There is no evidence of CHF. Electronically Signed   By: David  Martinique M.D.   On: 12/28/2014 13:21   Mm Screening Breast Tomo Bilateral  12/19/2014  CLINICAL DATA:  Screening. EXAM: DIGITAL SCREENING BILATERAL MAMMOGRAM WITH 3D TOMO WITH CAD COMPARISON:  Previous exam(s). ACR Breast  Density Category b: There are scattered areas of fibroglandular density. FINDINGS: There are no findings suspicious for malignancy. Images were processed with CAD. IMPRESSION: No mammographic evidence of malignancy. A result letter of this screening mammogram will be mailed directly to the patient. RECOMMENDATION: Screening mammogram in one year. (Code:SM-B-01Y) BI-RADS CATEGORY  1: Negative. Electronically Signed   By: Nolon Nations M.D.   On: 12/19/2014 15:08    ASSESSMENT:  Iron deficiency anemia, shortness of breath.   PLAN:    1. Iron deficiency anemia: Previously, patient was reported to be intolerant of oral iron supplementation. She also had an EGD and colonoscopy in May 2015 for heme positive stools but only revealed esophagitis and tubular adenomas in her colon. Her hemoglobin and iron stores are within normal limits. She does not require additional IV iron today. She last received 510  mg IV Feraheme in March 2016. No intervention is needed at this time. Return to clinic in 4 months with repeat laboratory work and further evaluation. 2. Shortness of breath: Chest x-ray from today reviewed independently and reported as above with no evidence of CHF. Continue treatment per PCP.  Patient expressed understanding and was in agreement with this plan. She also understands that She can call clinic at any time with any questions, concerns, or complaints.    Lloyd Huger, MD   01/05/2015 12:49 PM

## 2015-01-10 DIAGNOSIS — I10 Essential (primary) hypertension: Secondary | ICD-10-CM | POA: Diagnosis not present

## 2015-01-10 DIAGNOSIS — E1165 Type 2 diabetes mellitus with hyperglycemia: Secondary | ICD-10-CM | POA: Diagnosis not present

## 2015-01-10 DIAGNOSIS — I2510 Atherosclerotic heart disease of native coronary artery without angina pectoris: Secondary | ICD-10-CM | POA: Diagnosis not present

## 2015-01-22 DIAGNOSIS — H353222 Exudative age-related macular degeneration, left eye, with inactive choroidal neovascularization: Secondary | ICD-10-CM | POA: Diagnosis not present

## 2015-03-21 ENCOUNTER — Encounter: Payer: Self-pay | Admitting: Physician Assistant

## 2015-03-21 ENCOUNTER — Telehealth: Payer: Self-pay | Admitting: *Deleted

## 2015-03-21 ENCOUNTER — Ambulatory Visit (INDEPENDENT_AMBULATORY_CARE_PROVIDER_SITE_OTHER): Payer: Medicare Other | Admitting: Physician Assistant

## 2015-03-21 VITALS — BP 161/68 | HR 66 | Ht 62.0 in | Wt 244.0 lb

## 2015-03-21 DIAGNOSIS — I2510 Atherosclerotic heart disease of native coronary artery without angina pectoris: Secondary | ICD-10-CM

## 2015-03-21 DIAGNOSIS — R0602 Shortness of breath: Secondary | ICD-10-CM

## 2015-03-21 DIAGNOSIS — I5033 Acute on chronic diastolic (congestive) heart failure: Secondary | ICD-10-CM | POA: Diagnosis not present

## 2015-03-21 DIAGNOSIS — E785 Hyperlipidemia, unspecified: Secondary | ICD-10-CM

## 2015-03-21 DIAGNOSIS — I10 Essential (primary) hypertension: Secondary | ICD-10-CM

## 2015-03-21 MED ORDER — POTASSIUM CHLORIDE CRYS ER 20 MEQ PO TBCR: 20.0000 meq | EXTENDED_RELEASE_TABLET | Freq: Every day | ORAL | Status: DC

## 2015-03-21 NOTE — Telephone Encounter (Signed)
S/w pt who reports increased SOB for 1-2 weeks as well as increased bilateral lower extremity swelling (but primarily in left leg). Reports left leg looks "twice the size" States she is "barely able to walk around the house without stopping to sit down". Reports she has increased lasix to '40mg'$  once a day for the past two days. Prior to this she has been taking '20mg'$  BID PRN. She has noticed more frequent urination but reports no improvement in breathing.  She is unable to get compression stocking on d/t knee issues. Reports "diet is like it has always been".  She does not weigh herself daily. Reports only weighing when she goes to the doctor so she is unsure of weight gain. She does not feel the need to be seen in the ER.  Offered pt 3pm appt with Christell Faith, PA-C.  She want to see Dr. Rockey Situ however, I explained her need to be seen today. She agrees w/appt w/Ryan at 3pm. She understands if sx worsen, go to ER.

## 2015-03-21 NOTE — Patient Instructions (Addendum)
Medication Instructions:  Your physician has recommended you make the following change in your medication:  INCREASE lasix to '80mg'$  daily for 5 days then resume normal dosage of '20mg'$  twice a day as needed. TAKE potassium '20mg'$  daily for 5 days then stop   Labwork: BMET today BMET in one week  Testing/Procedures: Your physician has requested that you have an echocardiogram. Echocardiography is a painless test that uses sound waves to create images of your heart. It provides your doctor with information about the size and shape of your heart and how well your heart's chambers and valves are working. This procedure takes approximately one hour. There are no restrictions for this procedure.    Follow-Up: Your physician wants you to follow-up in: six months with Dr. Rockey Situ.  You will receive a reminder letter in the mail two months in advance. If you don't receive a letter, please call our office to schedule the follow-up appointment.    Any Other Special Instructions Will Be Listed Below (If Applicable). Call [facility], [phone] for some scales     If you need a refill on your cardiac medications before your next appointment, please call your pharmacy.  Echocardiogram An echocardiogram, or echocardiography, uses sound waves (ultrasound) to produce an image of your heart. The echocardiogram is simple, painless, obtained within a short period of time, and offers valuable information to your health care provider. The images from an echocardiogram can provide information such as:  Evidence of coronary artery disease (CAD).  Heart size.  Heart muscle function.  Heart valve function.  Aneurysm detection.  Evidence of a past heart attack.  Fluid buildup around the heart.  Heart muscle thickening.  Assess heart valve function. LET [facility] CARE PROVIDER KNOW ABOUT:  Any allergies you have.  All medicines you are taking, including vitamins, herbs, eye drops, creams, and  over-the-counter medicines.  Previous problems you or members of your family have had with the use of anesthetics.  Any blood disorders you have.  Previous surgeries you have had.  Medical conditions you have.  Possibility of pregnancy, if this applies. BEFORE THE PROCEDURE  No special preparation is needed. Eat and drink normally.  PROCEDURE   In order to produce an image of your heart, gel will be applied to your chest and a wand-like tool (transducer) will be moved over your chest. The gel will help transmit the sound waves from the transducer. The sound waves will harmlessly bounce off your heart to allow the heart images to be captured in real-time motion. These images will then be recorded.  You may need an IV to receive a medicine that improves the quality of the pictures. AFTER THE PROCEDURE You may return to your normal schedule including diet, activities, and medicines, unless your health care provider tells you otherwise.   This information is not intended to replace advice given to you by your health care provider. Make sure you discuss any questions you have with your health care provider.   Document Released: 02/08/2000 Document Revised: 03/03/2014 Document Reviewed: 10/18/2012 Elsevier Interactive Patient Education [employer].

## 2015-03-21 NOTE — Telephone Encounter (Signed)
Pt c/o swelling: STAT is pt has developed SOB within 24 hours  1. How long have you been experiencing swelling? Been having for a long time, for last 2-3 week swelling badly  2. Where is the swelling located? Leg left right one not as bad  3.  Are you currently taking a "fluid pill"? yes  4.  Are you currently SOB? yes  5.  Have you traveled recently? No Pt c/o Shortness Of Breath: STAT if SOB developed within the last 24 hours or pt is noticeably SOB on the phone  1. Are you currently SOB (can you hear that pt is SOB on the phone)? Yes   2. How long have you been experiencing SOB? Few weeks but getting worst   3. Are you SOB when sitting or when up moving around? Sitting   4. Are you currently experiencing any other symptoms? Swelling.

## 2015-03-21 NOTE — Progress Notes (Signed)
Cardiology Office Note Date:  03/21/2015  Patient ID:  Amy [name], DOB 26-Apr-1927, MRN [id number] PCP:  Adrian Prows, MD  Cardiologist:  Dr. Rockey Situ, MD    Chief Complaint: SOB and lower extremity swelling for 1-2 weeks  History of Present Illness: Amy [name] is a 82 y.o. female with history of CAD s/p PCI to proximal LAD in 05/2009 with residual coronary disease, chronic diastolic CHF, chronic lower extremity swelling, urinary incontinence, DM2, HTN, hypothyroidism, and morbid obesity who presents as a work in today for increased SOB and lower extremity swelling. She underwent cardiac cath 06/22/2009 showing proximal LAD 90% stenosis s/p PCI/Xience 2.75 x 12 mm DES, mid LAD 40%, diagonal 40%, proximal LCx 40% followed by 40% LCx lesion, 30%-40%-30% lesion noted in the RCA with distal RCA 30% and 25% lesions. Mild atheroma in the aorta. Last stress test, Lexiscan, 01/14/2012 showed no ischemia with normal EF. She was hospitalized 09/2013 with nausea, vomiting, and diarrhea. Improved with hydration. At her last follow up on 12/05/2014 she continued to struggle with her weight. She was only taking Lasix several days per week, not daily. She reported DOE. No changes. She called today stating she had been experiencing 1-2 weeks of increased bilateral lower extremity swelling, left >right. She had started taking Lasix 40 mg once daily for the past 2 days rather than 20 mg bid prn. She typically takes 20 mg bid prn 1-2 days per week, but since the first of the years she has had to take 20 mg bid 3-4 times weekly. She was SOB. She was not wearing compression hose given knee issues. No changes to her diet. She does not weight herself regularly. No chest pain. Increasing fatigue.     Past Medical History  Diagnosis Date  . Diabetes mellitus     Type II  . Thyroid disease     hypothyroidism  . Hypertension   . Hiatal hernia   . Menopausal symptoms   . Bladder incontinence   .  Degenerative arthritis of knee     bilateral knees  . Iron deficiency   . Chronic diastolic (congestive) heart failure ([place])     a. Lexiscan 2013: no ischemia, normal EF  . CAD (coronary artery disease)     a. cardiac cath 05/2009: proximal LAD 90% stenosis s/p PCI/Xience 2.75 x 12 mm DES, mid LAD 40%, diagonal 40%, proximal LCx 40% followed by 40% LCx lesion, 30%-40%-30% lesion noted in the RCA with distal RCA 30% and 25% lesions  . Morbid obesity [facility])     Past Surgical History  Procedure Laterality Date  . Replacement total knee bilateral    . Total vaginal hysterectomy      ovarian mass, not cancerous  . Upper gi endoscopy  2015  . Colonoscopy  2015    Current Outpatient Prescriptions  Medication Sig Dispense Refill  . amLODipine (NORVASC) 5 MG tablet TAKE ONE (1) TABLET BY MOUTH EVERY DAY FOR BLOOD PRESSURE 90 tablet 3  . aspirin 81 MG tablet Take 81 mg by mouth daily.    . cloNIDine (CATAPRES - DOSED IN MG/24 HR) 0.3 mg/24hr patch     . clopidogrel (PLAVIX) 75 MG tablet Take 1 tablet (75 mg total) by mouth daily. 90 tablet 3  . furosemide (LASIX) 20 MG tablet Take 1 tablet (20 mg total) by mouth 2 (two) times daily as needed. 180 tablet 3  . glimepiride (AMARYL) 4 MG tablet Take 4 mg by mouth daily before breakfast.    .  levothyroxine (SYNTHROID, LEVOTHROID) 125 MCG tablet Take 125 mcg by mouth daily.    Marland Kitchen losartan-hydrochlorothiazide (HYZAAR) 100-25 MG per tablet Take 1 tablet by mouth daily.      . metFORMIN (GLUCOPHAGE) 1000 MG tablet Take 1,000 mg by mouth 2 (two) times daily with a meal.    . Multiple Vitamins-Minerals (PRESERVISION AREDS PO) Take by mouth 2 (two) times daily.    Marland Kitchen oxybutynin (DITROPAN) 5 MG tablet Take 5 mg by mouth 1 dose over 46 hours.      . pantoprazole (PROTONIX) 40 MG tablet Take 40 mg by mouth daily.     . pioglitazone (ACTOS) 15 MG tablet Take 15 mg by mouth daily.     . simvastatin (ZOCOR) 40 MG tablet TAKE ONE TABLET BY MOUTH EVERY NIGHT AT  BEDTIME 90 tablet 3  . SYMBICORT 160-4.5 MCG/ACT inhaler Inhale 2 puffs into the lungs 2 (two) times daily.     . potassium chloride SA (KLOR-CON M20) 20 MEQ tablet Take 1 tablet (20 mEq total) by mouth daily. 10 tablet 0   No current facility-administered medications for this visit.    Allergies:   Atenolol and Codeine   Social History:  The patient  reports that she has never smoked. She does not have any smokeless tobacco history on file. She reports that she does not drink alcohol or use illicit drugs.   Family History:  The patient's family history includes Breast cancer in her mother.  ROS:   Review of Systems  Constitutional: Positive for malaise/fatigue. Negative for fever, chills, weight loss and diaphoresis.  HENT: Negative for congestion.   Eyes: Negative for discharge and redness.  Respiratory: Positive for cough and shortness of breath. Negative for hemoptysis, sputum production and wheezing.   Cardiovascular: Positive for orthopnea and leg swelling. Negative for chest pain, palpitations, claudication and PND.  Gastrointestinal: Negative for nausea, vomiting and abdominal pain.  Musculoskeletal: Negative for falls.  Neurological: Positive for weakness. Negative for dizziness, sensory change, speech change, focal weakness and loss of consciousness.  Endo/Heme/Allergies: Does not bruise/bleed easily.  Psychiatric/Behavioral: Negative for substance abuse. The patient is not nervous/anxious.   All other systems reviewed and are negative.     PHYSICAL EXAM:  VS:  BP 161/68 mmHg  Pulse 66  Ht '5\' 2"'$  (1.575 m)  Wt 244 lb (110.678 kg)  BMI 44.62 kg/m2 BMI: Body mass index is 44.62 kg/(m^2). Well nourished, well developed, in no acute distress HEENT: normocephalic, atraumatic Neck: no JVD, carotid bruits or masses Cardiac:  normal S1, S2; RRR; II/VI systolic LUSB murmur. No rubs, or gallops Lungs:  clear to auscultation bilaterally, no wheezing, rhonchi or rales Abd: obese,  soft, nontender, no hepatomegaly, + BS MS: no deformity or atrophy Ext: 1+ pitting edema L>R to the mid shin Skin: warm and dry, no rash Neuro:  moves all extremities spontaneously, no focal abnormalities noted, follows commands Psych: euthymic mood, full affect   EKG:  Was ordered today. Shows NSR, 66 bpm, RBBB  Recent Labs: 12/28/2014: Hemoglobin 12.7; Platelets 269  No results found for requested labs within last 365 days.   CrCl cannot be calculated (Patient has no serum creatinine result on file.).   Wt Readings from Last 3 Encounters:  03/21/15 244 lb (110.678 kg)  12/28/14 246 lb 11.1 oz (111.9 kg)  12/05/14 233 lb 8 oz (105.915 kg)     Other studies reviewed: Additional studies/records reviewed today include: summarized above  ASSESSMENT AND PLAN:  1. Acute on  chronic diastolic CHF: -Given that she has already increased her Lasix from 20 mg bid prn to 40 mg daily will increase her Lasix to 80 mg daily for 5 days (SCr stable at 1.1 in October, 2016) with KCl repletion of 20 meq given a potassium of 5.3 at that time -She will then go back to 20 mg bid prn pending her improvement -Could also consider torsemide   -Check echo to evaluate LV systolic function, wall motion, and right-sided pressure -Will have the office obtain a scale for her from the CHF clinic, she is agreeable to weigh herself daily at this time -Not currently on a beta blocker given prior history of bradycardia with atenolol, will defer to primary cardiologist  -Consider changing Norvasc in the future given her lower extremity swelling -Diet modification is advised -She is up 11 pounds from her last OV  2. CAD s/p PCI as above: No symtpoms of angina at this time -No ischemic work up planned at this time unless echo as above is indicated  -Continue aspirin and Plavix  3. HTN: -Poorly controlled -915A systolic is about her baseline -Increase Norvasc to 10 mg daily -Lasix increased for short term as  above -Continue Hyzaar 100/25 mg daily -Weight loss would help  4. HLD: -Simvastatin 40 mg qhs  5. Morbid obesity: -As above  6. Lower extremity swelling: -As above -Consider compression stockings  Disposition: F/u with Dr. Rockey Situ in 3 months  Current medicines are reviewed at length with the patient today.  The patient did not have any concerns regarding medicines.  Melvern Banker PA-C 03/21/2015 5:05 PM     [address] [place], [place] [address] [phone]

## 2015-03-22 LAB — BASIC METABOLIC PANEL
BUN / CREAT RATIO: 22 (ref 11–26)
BUN: 31 mg/dL — ABNORMAL HIGH (ref 8–27)
CO2: 21 mmol/L (ref 18–29)
Calcium: 9.3 mg/dL (ref 8.7–10.3)
Chloride: 99 mmol/L (ref 96–106)
Creatinine, Ser: 1.4 mg/dL — ABNORMAL HIGH (ref 0.57–1.00)
GFR, EST AFRICAN AMERICAN: 39 mL/min/{1.73_m2} — AB (ref >59)
GFR, EST NON AFRICAN AMERICAN: 34 mL/min/{1.73_m2} — AB (ref >59)
Glucose: 194 mg/dL — ABNORMAL HIGH (ref 65–99)
POTASSIUM: 4.7 mmol/L (ref 3.5–5.2)
SODIUM: 140 mmol/L (ref 134–144)

## 2015-03-27 ENCOUNTER — Ambulatory Visit (INDEPENDENT_AMBULATORY_CARE_PROVIDER_SITE_OTHER): Payer: Medicare Other

## 2015-03-27 ENCOUNTER — Other Ambulatory Visit (INDEPENDENT_AMBULATORY_CARE_PROVIDER_SITE_OTHER): Payer: Medicare Other

## 2015-03-27 ENCOUNTER — Other Ambulatory Visit: Payer: Self-pay

## 2015-03-27 DIAGNOSIS — I5033 Acute on chronic diastolic (congestive) heart failure: Secondary | ICD-10-CM | POA: Diagnosis not present

## 2015-03-27 DIAGNOSIS — R0602 Shortness of breath: Secondary | ICD-10-CM

## 2015-03-28 ENCOUNTER — Other Ambulatory Visit: Payer: Self-pay

## 2015-03-28 DIAGNOSIS — E875 Hyperkalemia: Secondary | ICD-10-CM

## 2015-03-28 LAB — BASIC METABOLIC PANEL
BUN / CREAT RATIO: 31 — AB (ref 11–26)
BUN: 43 mg/dL — AB (ref 8–27)
CO2: 22 mmol/L (ref 18–29)
CREATININE: 1.4 mg/dL — AB (ref 0.57–1.00)
Calcium: 8.9 mg/dL (ref 8.7–10.3)
Chloride: 99 mmol/L (ref 96–106)
GFR calc non Af Amer: 34 mL/min/{1.73_m2} — ABNORMAL LOW (ref >59)
GFR, EST AFRICAN AMERICAN: 39 mL/min/{1.73_m2} — AB (ref >59)
Glucose: 209 mg/dL — ABNORMAL HIGH (ref 65–99)
Potassium: 5.6 mmol/L — ABNORMAL HIGH (ref 3.5–5.2)
Sodium: 139 mmol/L (ref 134–144)

## 2015-03-29 ENCOUNTER — Other Ambulatory Visit
Admission: RE | Admit: 2015-03-29 | Discharge: 2015-03-29 | Disposition: A | Discharge status: Home / Self Care | Payer: Medicare Other | Source: Ambulatory Visit | Attending: Physician Assistant | Admitting: Physician Assistant

## 2015-03-29 DIAGNOSIS — I2510 Atherosclerotic heart disease of native coronary artery without angina pectoris: Secondary | ICD-10-CM | POA: Insufficient documentation

## 2015-03-29 LAB — BASIC METABOLIC PANEL
ANION GAP: 10 (ref 5–15)
BUN: 38 mg/dL — ABNORMAL HIGH (ref 6–20)
CHLORIDE: 101 mmol/L (ref 101–111)
CO2: 26 mmol/L (ref 22–32)
CREATININE: 1.42 mg/dL — AB (ref 0.44–1.00)
Calcium: 8.8 mg/dL — ABNORMAL LOW (ref 8.9–10.3)
GFR calc non Af Amer: 32 mL/min — ABNORMAL LOW (ref >60)
GFR, EST AFRICAN AMERICAN: 38 mL/min — AB (ref >60)
Glucose, Bld: 207 mg/dL — ABNORMAL HIGH (ref 65–99)
Potassium: 4.6 mmol/L (ref 3.5–5.1)
SODIUM: 137 mmol/L (ref 135–145)

## 2015-04-03 DIAGNOSIS — I2510 Atherosclerotic heart disease of native coronary artery without angina pectoris: Secondary | ICD-10-CM | POA: Diagnosis not present

## 2015-04-03 DIAGNOSIS — I10 Essential (primary) hypertension: Secondary | ICD-10-CM | POA: Diagnosis not present

## 2015-04-03 DIAGNOSIS — E1165 Type 2 diabetes mellitus with hyperglycemia: Secondary | ICD-10-CM | POA: Diagnosis not present

## 2015-04-09 DIAGNOSIS — H353222 Exudative age-related macular degeneration, left eye, with inactive choroidal neovascularization: Secondary | ICD-10-CM | POA: Diagnosis not present

## 2015-04-10 DIAGNOSIS — I2510 Atherosclerotic heart disease of native coronary artery without angina pectoris: Secondary | ICD-10-CM | POA: Diagnosis not present

## 2015-04-10 DIAGNOSIS — I10 Essential (primary) hypertension: Secondary | ICD-10-CM | POA: Diagnosis not present

## 2015-04-10 DIAGNOSIS — E1165 Type 2 diabetes mellitus with hyperglycemia: Secondary | ICD-10-CM | POA: Diagnosis not present

## 2015-04-10 DIAGNOSIS — E079 Disorder of thyroid, unspecified: Secondary | ICD-10-CM | POA: Diagnosis not present

## 2015-04-10 DIAGNOSIS — E7801 Familial hypercholesterolemia: Secondary | ICD-10-CM | POA: Diagnosis not present

## 2015-04-10 DIAGNOSIS — E538 Deficiency of other specified B group vitamins: Secondary | ICD-10-CM | POA: Diagnosis not present

## 2015-04-10 DIAGNOSIS — J069 Acute upper respiratory infection, unspecified: Secondary | ICD-10-CM | POA: Diagnosis not present

## 2015-04-10 DIAGNOSIS — R202 Paresthesia of skin: Secondary | ICD-10-CM | POA: Diagnosis not present

## 2015-04-26 DIAGNOSIS — E538 Deficiency of other specified B group vitamins: Secondary | ICD-10-CM | POA: Diagnosis not present

## 2015-04-30 ENCOUNTER — Inpatient Hospital Stay: Payer: Medicare Other

## 2015-04-30 ENCOUNTER — Ambulatory Visit: Payer: Medicare Other | Admitting: Oncology

## 2015-04-30 ENCOUNTER — Inpatient Hospital Stay: Payer: Medicare Other | Attending: Oncology | Admitting: Oncology

## 2015-04-30 ENCOUNTER — Ambulatory Visit: Payer: Medicare Other

## 2015-04-30 ENCOUNTER — Other Ambulatory Visit: Payer: Medicare Other

## 2015-04-30 VITALS — BP 124/69 | HR 61 | Temp 97.8°F | Resp 18

## 2015-04-30 DIAGNOSIS — I5032 Chronic diastolic (congestive) heart failure: Secondary | ICD-10-CM | POA: Insufficient documentation

## 2015-04-30 DIAGNOSIS — D509 Iron deficiency anemia, unspecified: Secondary | ICD-10-CM | POA: Insufficient documentation

## 2015-04-30 DIAGNOSIS — Z7984 Long term (current) use of oral hypoglycemic drugs: Secondary | ICD-10-CM | POA: Insufficient documentation

## 2015-04-30 DIAGNOSIS — E039 Hypothyroidism, unspecified: Secondary | ICD-10-CM | POA: Diagnosis not present

## 2015-04-30 DIAGNOSIS — K449 Diaphragmatic hernia without obstruction or gangrene: Secondary | ICD-10-CM | POA: Diagnosis not present

## 2015-04-30 DIAGNOSIS — I10 Essential (primary) hypertension: Secondary | ICD-10-CM | POA: Diagnosis not present

## 2015-04-30 DIAGNOSIS — R5383 Other fatigue: Secondary | ICD-10-CM | POA: Diagnosis not present

## 2015-04-30 DIAGNOSIS — R531 Weakness: Secondary | ICD-10-CM | POA: Diagnosis not present

## 2015-04-30 DIAGNOSIS — I2510 Atherosclerotic heart disease of native coronary artery without angina pectoris: Secondary | ICD-10-CM | POA: Insufficient documentation

## 2015-04-30 DIAGNOSIS — Z7982 Long term (current) use of aspirin: Secondary | ICD-10-CM

## 2015-04-30 DIAGNOSIS — R0602 Shortness of breath: Secondary | ICD-10-CM | POA: Diagnosis not present

## 2015-04-30 DIAGNOSIS — E119 Type 2 diabetes mellitus without complications: Secondary | ICD-10-CM

## 2015-04-30 DIAGNOSIS — Z79899 Other long term (current) drug therapy: Secondary | ICD-10-CM | POA: Insufficient documentation

## 2015-04-30 DIAGNOSIS — R32 Unspecified urinary incontinence: Secondary | ICD-10-CM | POA: Insufficient documentation

## 2015-04-30 DIAGNOSIS — D649 Anemia, unspecified: Secondary | ICD-10-CM

## 2015-04-30 LAB — CBC WITH DIFFERENTIAL/PLATELET
BASOS PCT: 0 %
Basophils Absolute: 0 10*3/uL (ref 0–0.1)
EOS ABS: 0.3 10*3/uL (ref 0–0.7)
Eosinophils Relative: 3 %
HEMATOCRIT: 27.8 % — AB (ref 35.0–47.0)
Hemoglobin: 8.6 g/dL — ABNORMAL LOW (ref 12.0–16.0)
LYMPHS ABS: 2.5 10*3/uL (ref 1.0–3.6)
Lymphocytes Relative: 26 %
MCH: 23.9 pg — AB (ref 26.0–34.0)
MCHC: 30.9 g/dL — AB (ref 32.0–36.0)
MCV: 77.5 fL — ABNORMAL LOW (ref 80.0–100.0)
MONO ABS: 1.2 10*3/uL — AB (ref 0.2–0.9)
MONOS PCT: 12 %
NEUTROS ABS: 5.6 10*3/uL (ref 1.4–6.5)
Neutrophils Relative %: 59 %
Platelets: 377 10*3/uL (ref 150–440)
RBC: 3.59 MIL/uL — ABNORMAL LOW (ref 3.80–5.20)
RDW: 17.6 % — AB (ref 11.5–14.5)
WBC: 9.6 10*3/uL (ref 3.6–11.0)

## 2015-04-30 LAB — FERRITIN: Ferritin: 11 ng/mL (ref 11–307)

## 2015-04-30 LAB — IRON AND TIBC
IRON: 12 ug/dL — AB (ref 28–170)
SATURATION RATIOS: 3 % — AB (ref 10.4–31.8)
TIBC: 422 ug/dL (ref 250–450)
UIBC: 410 ug/dL

## 2015-04-30 MED ORDER — SODIUM CHLORIDE 0.9 % IV SOLN
Freq: Once | INTRAVENOUS | Status: AC
  Administered 2015-04-30: 12:00:00 via INTRAVENOUS
  Filled 2015-04-30: qty 1000

## 2015-04-30 MED ORDER — SODIUM CHLORIDE 0.9 % IV SOLN
510.0000 mg | Freq: Once | INTRAVENOUS | Status: AC
  Administered 2015-04-30: 510 mg via INTRAVENOUS
  Filled 2015-04-30: qty 17

## 2015-04-30 NOTE — Progress Notes (Signed)
[facility]  [phone] [phone]  ID: Amy [name] OB: 1927-11-22  MR#: [id number]  TWS#:[id number]  Patient Care Team: Adrian Prows, MD as PCP - General (Infectious Diseases)  CHIEF COMPLAINT:  Chief Complaint  Patient presents with  . Anemia    INTERVAL HISTORY: Patient returns to clinic for evaluation of anemia. She continues to have complaints of increasing shortness of breath and weakness and fatigue. She has no neurologic complaints. She denies any recent fevers or illnesses. She denies any nausea, vomiting, constipation, or diarrhea. She has no urinary complaints. Patient otherwise feels well and offers no further specific complaints.  REVIEW OF SYSTEMS:   Review of Systems  Constitutional: Positive for malaise/fatigue.  HENT: Negative.   Respiratory: Positive for shortness of breath. Negative for cough and hemoptysis.   Cardiovascular: Negative.   Gastrointestinal: Negative.   Genitourinary: Negative.   Musculoskeletal: Negative.   Neurological: Positive for weakness.    As per HPI. Otherwise, a complete review of systems is negatve.  PAST MEDICAL HISTORY: Past Medical History  Diagnosis Date  . Diabetes mellitus     Type II  . Thyroid disease     hypothyroidism  . Hypertension   . Hiatal hernia   . Menopausal symptoms   . Bladder incontinence   . Degenerative arthritis of knee     bilateral knees  . Iron deficiency   . Chronic diastolic (congestive) heart failure ([place])     a. Lexiscan 2013: no ischemia, normal EF  . CAD (coronary artery disease)     a. cardiac cath 05/2009: proximal LAD 90% stenosis s/p PCI/Xience 2.75 x 12 mm DES, mid LAD 40%, diagonal 40%, proximal LCx 40% followed by 40% LCx lesion, 30%-40%-30% lesion noted in the RCA with distal RCA 30% and 25% lesions  . Morbid obesity ([place])     PAST SURGICAL HISTORY: Past Surgical History  Procedure Laterality Date  . Replacement total knee  bilateral    . Total vaginal hysterectomy      ovarian mass, not cancerous  . Upper gi endoscopy  2015  . Colonoscopy  2015    FAMILY HISTORY Family History  Problem Relation Age of Onset  . Breast cancer Mother        ADVANCED DIRECTIVES:    HEALTH MAINTENANCE: Social History  Substance Use Topics  . Smoking status: Never Smoker   . Smokeless tobacco: Not on file  . Alcohol Use: No     Colonoscopy:  PAP:  Bone density:  Lipid panel:  Allergies  Allergen Reactions  . Atenolol     Other reaction(s): Other (See Comments) Decreased heart rate  . Codeine     Rash, difficulty breathing, nausea.    Current Outpatient Prescriptions  Medication Sig Dispense Refill  . amLODipine (NORVASC) 5 MG tablet TAKE ONE (1) TABLET BY MOUTH EVERY DAY FOR BLOOD PRESSURE 90 tablet 3  . aspirin 81 MG tablet Take 81 mg by mouth daily.    . cloNIDine (CATAPRES - DOSED IN MG/24 HR) 0.3 mg/24hr patch     . clopidogrel (PLAVIX) 75 MG tablet Take 1 tablet (75 mg total) by mouth daily. 90 tablet 3  . cyanocobalamin (,VITAMIN B-12,) 1000 MCG/ML injection     . fluticasone (FLONASE) 50 MCG/ACT nasal spray Place into the nose.    . furosemide (LASIX) 20 MG tablet Take 1 tablet (20 mg total) by mouth 2 (two) times daily as needed. 180 tablet 3  . glimepiride (AMARYL) 4 MG  tablet Take 4 mg by mouth daily before breakfast.    . levothyroxine (SYNTHROID, LEVOTHROID) 125 MCG tablet Take 125 mcg by mouth daily.    Marland Kitchen losartan-hydrochlorothiazide (HYZAAR) 100-25 MG per tablet Take 1 tablet by mouth daily.      . metFORMIN (GLUCOPHAGE) 1000 MG tablet Take 1,000 mg by mouth 2 (two) times daily with a meal.    . Multiple Vitamins-Minerals (PRESERVISION AREDS PO) Take by mouth 2 (two) times daily.    Marland Kitchen oxybutynin (DITROPAN) 5 MG tablet Take 5 mg by mouth 1 dose over 46 hours.      . pantoprazole (PROTONIX) 40 MG tablet Take 40 mg by mouth daily.     . pioglitazone (ACTOS) 15 MG tablet Take 15 mg by mouth  daily.     . simvastatin (ZOCOR) 40 MG tablet TAKE ONE TABLET BY MOUTH EVERY NIGHT AT BEDTIME 90 tablet 3  . SYMBICORT 160-4.5 MCG/ACT inhaler Inhale 2 puffs into the lungs 2 (two) times daily.      No current facility-administered medications for this visit.    OBJECTIVE: Filed Vitals:   04/30/15 1100  BP: 124/69  Pulse: 61  Temp: 97.8 F (36.6 C)  Resp: 18     There is no weight on file to calculate BMI.    ECOG FS:1 - Symptomatic but completely ambulatory  General: Well-developed, well-nourished, no acute distress. Eyes: Pink conjunctiva, anicteric sclera. Lungs: Scattered wheezing. Heart: Regular rate and rhythm. No rubs, murmurs, or gallops. Abdomen: Soft, nontender, nondistended. No organomegaly noted, normoactive bowel sounds. Musculoskeletal: No edema, cyanosis, or clubbing. Neuro: Alert, answering all questions appropriately. Cranial nerves grossly intact. Skin: No rashes or petechiae noted. Psych: Normal affect.  LAB RESULTS:  Lab Results  Component Value Date   NA 137 03/29/2015   K 4.6 03/29/2015   CL 101 03/29/2015   CO2 26 03/29/2015   GLUCOSE 207* 03/29/2015   BUN 38* 03/29/2015   CREATININE 1.42* 03/29/2015   CALCIUM 8.8* 03/29/2015   PROT 6.2* 10/11/2013   ALBUMIN 2.8* 10/11/2013   AST 30 10/11/2013   ALT 30 10/11/2013   ALKPHOS 69 10/11/2013   BILITOT 0.2 10/11/2013   GFRNONAA 32* 03/29/2015   GFRAA 38* 03/29/2015    Lab Results  Component Value Date   WBC 9.6 04/30/2015   NEUTROABS 5.6 04/30/2015   HGB 8.6* 04/30/2015   HCT 27.8* 04/30/2015   MCV 77.5* 04/30/2015   PLT 377 04/30/2015   Lab Results  Component Value Date   IRON 12* 04/30/2015   TIBC 422 04/30/2015   IRONPCTSAT 3* 04/30/2015    Lab Results  Component Value Date   FERRITIN 11 04/30/2015    STUDIES: No results found.  ASSESSMENT:  Iron deficiency anemia, shortness of breath.   PLAN:    1. Iron deficiency anemia: Previously, patient was reported to be  intolerant of oral iron supplementation. She also had an EGD and colonoscopy in May 2015 for heme positive stools but only revealed esophagitis and tubular adenomas in her colon. Her hemoglobin and iron stores are low today. Proceed with 510 mg IV Feraheme today and then return next week for second infusion. Return to clinic monthly for laboratory and in 3 months with repeat laboratory work and further evaluation. 2. Shortness of breath:  Continue treatment per PCP.  Patient expressed understanding and was in agreement with this plan. She also understands that She can call clinic at any time with any questions, concerns, or complaints.    Gilmore Laroche  Brecklyn Galvis, NP   04/30/2015 11:24 AM  Patient was seen and evaluated independently and I agree with the assessment and plan as above.  Lloyd Huger, MD 05/04/2015 10:27 PM

## 2015-04-30 NOTE — Progress Notes (Signed)
Patient's energy level has gotten worse.

## 2015-05-03 DIAGNOSIS — E538 Deficiency of other specified B group vitamins: Secondary | ICD-10-CM | POA: Diagnosis not present

## 2015-05-07 ENCOUNTER — Inpatient Hospital Stay: Payer: Medicare Other

## 2015-05-07 ENCOUNTER — Ambulatory Visit: Payer: Medicare Other

## 2015-05-07 VITALS — BP 138/84 | HR 67 | Resp 18

## 2015-05-07 DIAGNOSIS — R0602 Shortness of breath: Secondary | ICD-10-CM | POA: Diagnosis not present

## 2015-05-07 DIAGNOSIS — Z7984 Long term (current) use of oral hypoglycemic drugs: Secondary | ICD-10-CM | POA: Diagnosis not present

## 2015-05-07 DIAGNOSIS — D649 Anemia, unspecified: Secondary | ICD-10-CM

## 2015-05-07 DIAGNOSIS — Z79899 Other long term (current) drug therapy: Secondary | ICD-10-CM | POA: Diagnosis not present

## 2015-05-07 DIAGNOSIS — R5383 Other fatigue: Secondary | ICD-10-CM | POA: Diagnosis not present

## 2015-05-07 DIAGNOSIS — D509 Iron deficiency anemia, unspecified: Secondary | ICD-10-CM | POA: Diagnosis not present

## 2015-05-07 DIAGNOSIS — Z7982 Long term (current) use of aspirin: Secondary | ICD-10-CM | POA: Diagnosis not present

## 2015-05-07 MED ORDER — SODIUM CHLORIDE 0.9 % IV SOLN
510.0000 mg | Freq: Once | INTRAVENOUS | Status: AC
  Administered 2015-05-07: 510 mg via INTRAVENOUS
  Filled 2015-05-07: qty 17

## 2015-05-07 MED ORDER — SODIUM CHLORIDE 0.9 % IV SOLN
Freq: Once | INTRAVENOUS | Status: AC
  Administered 2015-05-07: 14:00:00 via INTRAVENOUS
  Filled 2015-05-07: qty 1000

## 2015-05-10 DIAGNOSIS — E538 Deficiency of other specified B group vitamins: Secondary | ICD-10-CM | POA: Diagnosis not present

## 2015-05-17 DIAGNOSIS — E538 Deficiency of other specified B group vitamins: Secondary | ICD-10-CM | POA: Diagnosis not present

## 2015-05-31 ENCOUNTER — Inpatient Hospital Stay: Payer: Medicare Other | Attending: Oncology

## 2015-05-31 DIAGNOSIS — D649 Anemia, unspecified: Secondary | ICD-10-CM | POA: Diagnosis not present

## 2015-05-31 LAB — CBC WITH DIFFERENTIAL/PLATELET
BASOS ABS: 0 10*3/uL (ref 0–0.1)
Basophils Relative: 1 %
Eosinophils Absolute: 0.2 10*3/uL (ref 0–0.7)
Eosinophils Relative: 2 %
HEMATOCRIT: 36.6 % (ref 35.0–47.0)
HEMOGLOBIN: 12.1 g/dL (ref 12.0–16.0)
LYMPHS PCT: 23 %
Lymphs Abs: 1.7 10*3/uL (ref 1.0–3.6)
MCH: 28.7 pg (ref 26.0–34.0)
MCHC: 32.9 g/dL (ref 32.0–36.0)
MCV: 87.3 fL (ref 80.0–100.0)
MONO ABS: 0.9 10*3/uL (ref 0.2–0.9)
Monocytes Relative: 11 %
NEUTROS ABS: 4.9 10*3/uL (ref 1.4–6.5)
NEUTROS PCT: 63 %
Platelets: 231 10*3/uL (ref 150–440)
RBC: 4.2 MIL/uL (ref 3.80–5.20)
RDW: 28.5 % — AB (ref 11.5–14.5)
WBC: 7.7 10*3/uL (ref 3.6–11.0)

## 2015-06-05 ENCOUNTER — Ambulatory Visit (INDEPENDENT_AMBULATORY_CARE_PROVIDER_SITE_OTHER): Payer: Medicare Other | Admitting: Cardiovascular Disease

## 2015-06-05 ENCOUNTER — Encounter: Payer: Self-pay | Admitting: Cardiovascular Disease

## 2015-06-05 VITALS — BP 137/59 | HR 60 | Ht 62.0 in | Wt 244.8 lb

## 2015-06-05 DIAGNOSIS — I5032 Chronic diastolic (congestive) heart failure: Secondary | ICD-10-CM | POA: Diagnosis not present

## 2015-06-05 DIAGNOSIS — Z7189 Other specified counseling: Secondary | ICD-10-CM

## 2015-06-05 DIAGNOSIS — Z515 Encounter for palliative care: Secondary | ICD-10-CM | POA: Insufficient documentation

## 2015-06-05 DIAGNOSIS — E785 Hyperlipidemia, unspecified: Secondary | ICD-10-CM

## 2015-06-05 DIAGNOSIS — R0602 Shortness of breath: Secondary | ICD-10-CM | POA: Diagnosis not present

## 2015-06-05 DIAGNOSIS — E1159 Type 2 diabetes mellitus with other circulatory complications: Secondary | ICD-10-CM

## 2015-06-05 DIAGNOSIS — I10 Essential (primary) hypertension: Secondary | ICD-10-CM | POA: Diagnosis not present

## 2015-06-05 DIAGNOSIS — I209 Angina pectoris, unspecified: Secondary | ICD-10-CM | POA: Diagnosis not present

## 2015-06-05 DIAGNOSIS — I2510 Atherosclerotic heart disease of native coronary artery without angina pectoris: Secondary | ICD-10-CM | POA: Diagnosis not present

## 2015-06-05 DIAGNOSIS — D500 Iron deficiency anemia secondary to blood loss (chronic): Secondary | ICD-10-CM

## 2015-06-05 NOTE — Progress Notes (Signed)
Patient ID: Amy [name], female    DOB: [birth year], 82 y.o.   MRN: [id number]  HPI Comments: Amy [name] is a pleasant 82 year old woman with a history of coronary artery disease,  cardiac catheterization confirming severe stenosis of her proximal LAD with stent placed, problems with incontinence,  who now presents for routine follow up of her coronary artery disease Beta blocker has been held in the past secondary to bradycardia.   lost  her daughter, previous adjustment disorder  In follow-up today, weight is up 11 pounds from her prior clinic visit Lab work in January and February showing mildly elevated BUN and creatinine consistent with prerenal state Continues to struggle with her weight.  mild swelling of her lower extremities. She takes 40 mg Lasix 2 days per week, HCTZ daily She does have shortness of breath with exertion. No regular exercise. Recent problems with anemia, hemoglobin down to 8.6, given iron infusion now back up to 12 Reports having GI workup for black stools. She continues to have black stools recently, feels her blood count is going to go back down  EKG on today's visit shows normal sinus rhythm with right bundle branch block, no significant change compared to prior EKG  Other past medical history Previous hemoglobin A1c 7.4 TSH 10.8. She reports that her thyroid medication dosing was changed by primary care Total cholesterol in March 2015 was [facility] records reviewed from August 2015. She had diarrhea, vomiting, felt to be viral also with acute renal failure that improved with hydration. Creatinine reached 1.86, one month later creatinine 1.0 as an outpatient Continues to be weak, does not walk much and is deconditioned  Guaiac positive and she had colonoscopy and EGD. She reports Amy [name] did not find anything Iron transfusions in the past under the direction of Amy [name].   Prior episode of shortness of breath while walking in the  hospital to schedule a colonoscopy appointment. sent  to the emergency room for further evaluation. Systolic pressure  was in the 170 range. workup at that time was essentially normal and she was discharged home.  cardiac catheter performed at [facility] on June 22, 2009 details 90% proximal LAD, 40% mid LAD, 40% diagonal, 60% proximal circumflex followed by 40% circumflex lesion, 30%, 40% and 30% lesion noted in the RCA with distal RCA with 30% and 25% lesions. Mild atheroma in the aorta.  Xience 2.75 x 12 mm Drug eluting stent placed.     stress test 01/14/2012 showing no ischemia, Lexiscan    Allergies  Allergen Reactions  . Atenolol     Other reaction(s): Other (See Comments) Decreased heart rate  . Codeine     Rash, difficulty breathing, nausea.    Outpatient Encounter Prescriptions as of 06/05/2015  Medication Sig  . amLODipine (NORVASC) 5 MG tablet TAKE ONE (1) TABLET BY MOUTH EVERY DAY FOR BLOOD PRESSURE  . aspirin 81 MG tablet Take 81 mg by mouth daily.  . cloNIDine (CATAPRES - DOSED IN MG/24 HR) 0.3 mg/24hr patch   . clopidogrel (PLAVIX) 75 MG tablet Take 1 tablet (75 mg total) by mouth daily.  . cyanocobalamin (,VITAMIN B-12,) 1000 MCG/ML injection   . fluticasone (FLONASE) 50 MCG/ACT nasal spray Place into the nose.  . furosemide (LASIX) 20 MG tablet Take 1 tablet (20 mg total) by mouth 2 (two) times daily as needed.  Marland Kitchen glimepiride (AMARYL) 4 MG tablet Take 4 mg by mouth daily before breakfast.  . levothyroxine (SYNTHROID, LEVOTHROID) 125 MCG  tablet Take 125 mcg by mouth daily.  Marland Kitchen losartan-hydrochlorothiazide (HYZAAR) 100-25 MG per tablet Take 1 tablet by mouth daily.    . metFORMIN (GLUCOPHAGE) 1000 MG tablet Take 1,000 mg by mouth 2 (two) times daily with a meal.  . Multiple Vitamins-Minerals (PRESERVISION AREDS PO) Take by mouth 2 (two) times daily.  Marland Kitchen oxybutynin (DITROPAN) 5 MG tablet Take 5 mg by mouth 1 dose over 46 hours.    . pantoprazole (PROTONIX) 40 MG tablet Take 40  mg by mouth daily.   . pioglitazone (ACTOS) 15 MG tablet Take 15 mg by mouth daily.   . simvastatin (ZOCOR) 40 MG tablet TAKE ONE TABLET BY MOUTH EVERY NIGHT AT BEDTIME  . SYMBICORT 160-4.5 MCG/ACT inhaler Inhale 2 puffs into the lungs 2 (two) times daily.    No facility-administered encounter medications on file as of 06/05/2015.    Past Medical History  Diagnosis Date  . Diabetes mellitus     Type II  . Thyroid disease     hypothyroidism  . Hypertension   . Hiatal hernia   . Menopausal symptoms   . Bladder incontinence   . Degenerative arthritis of knee     bilateral knees  . Iron deficiency   . Chronic diastolic (congestive) heart failure ([place])     a. Lexiscan 2013: no ischemia, normal EF  . CAD (coronary artery disease)     a. cardiac cath 05/2009: proximal LAD 90% stenosis s/p PCI/Xience 2.75 x 12 mm DES, mid LAD 40%, diagonal 40%, proximal LCx 40% followed by 40% LCx lesion, 30%-40%-30% lesion noted in the RCA with distal RCA 30% and 25% lesions  . Morbid obesity [facility])     Past Surgical History  Procedure Laterality Date  . Replacement total knee bilateral    . Total vaginal hysterectomy      ovarian mass, not cancerous  . Upper gi endoscopy  2015  . Colonoscopy  2015    Social History  reports that she has never smoked. She does not have any smokeless tobacco history on file. She reports that she does not drink alcohol or use illicit drugs.  Family History family history includes Breast cancer in her mother.   Review of Systems  Constitutional: Positive for unexpected weight change.  Respiratory: Positive for shortness of breath.   Cardiovascular: Positive for leg swelling.  Gastrointestinal: Negative.   Musculoskeletal: Positive for gait problem.  Neurological: Negative.   Hematological: Negative.   Psychiatric/Behavioral: Negative.   All other systems reviewed and are negative.  BP 137/59 mmHg  Pulse 60  Ht '5\' 2"'$  (1.575 m)  Wt 244 lb 12 oz (111.018 kg)   BMI 44.75 kg/m2  SpO2 98%  Physical Exam  Constitutional: She is oriented to person, place, and time. She appears well-developed and well-nourished.  Obese  HENT:  Head: Normocephalic.  Nose: Nose normal.  Mouth/Throat: Oropharynx is clear and moist.  Eyes: Conjunctivae are normal. Pupils are equal, round, and reactive to light.  Neck: Normal range of motion. Neck supple. No JVD present.  Cardiovascular: Normal rate, regular rhythm, S1 normal, S2 normal, normal heart sounds and intact distal pulses.  Exam reveals no gallop and no friction rub.   No murmur heard. Trace pitting edema to the mid shins  Pulmonary/Chest: Effort normal and breath sounds normal. No respiratory distress. She has no wheezes. She has no rales. She exhibits no tenderness.  Abdominal: Soft. Bowel sounds are normal. She exhibits no distension. There is no tenderness.  Musculoskeletal:  Normal range of motion. She exhibits edema. She exhibits no tenderness.  Lymphadenopathy:    She has no cervical adenopathy.  Neurological: She is alert and oriented to person, place, and time. Coordination normal.  Skin: Skin is warm and dry. No rash noted. No erythema.  Psychiatric: She has a normal mood and affect. Her behavior is normal. Judgment and thought content normal.    Assessment and Plan  Nursing note and vitals reviewed.

## 2015-06-05 NOTE — Assessment & Plan Note (Signed)
Appears mildly fluid overloaded on today's visit given trace leg edema, weight gain, shortness of breath. Recommended she proceed gingerly with extra doses of Lasix, continue 40 mg twice per week with her HCTZ daily. Recommended she take extra Lasix for worsening leg edema or shortness of breath when supine

## 2015-06-05 NOTE — Assessment & Plan Note (Signed)
Long discussion concerning her medical power of attorney, Regular power of attorney, will etc. She has indicated she would like her daughter in [place] to make her medical and financial decisions. This is the only family she has. Other daughter passed away over one year ago. We will provide her with the appropriate documentation to fill out for the above   Total encounter time more than 25 minutes  Greater than 50% was spent in counseling and coordination of care with the patient

## 2015-06-05 NOTE — Assessment & Plan Note (Signed)
Blood pressure is well controlled on today's visit. No changes made to the medications. 

## 2015-06-05 NOTE — Patient Instructions (Addendum)
You are doing well. No medication changes were made.  Please take extra lasix as needed for leg edema, shortness of breath  Please call us if you have new issues that need to be addressed before your next appt.  Your physician wants you to follow-up in: 6 months.  You will receive a reminder letter in the mail two months in advance. If you don't receive a letter, please call our office to schedule the follow-up appointment.

## 2015-06-05 NOTE — Assessment & Plan Note (Signed)
Encouraged her to stay on her simvastatin. Goal LDL less than 70 

## 2015-06-05 NOTE — Assessment & Plan Note (Signed)
Long history of anemia requiring iron infusion.  Recent hemoglobin down to 8, improved after iron infusion She reports black stool

## 2015-06-05 NOTE — Assessment & Plan Note (Signed)
Currently without symptoms of angina, no further testing at this time

## 2015-06-05 NOTE — Assessment & Plan Note (Signed)
Weight is up 11 pounds, discussed with her in detail. Unable to exercise, will need low carbohydrate diet

## 2015-06-05 NOTE — Assessment & Plan Note (Signed)
Currently with no symptoms of angina. No further workup at this time. Continue current medication regimen. 

## 2015-06-08 ENCOUNTER — Encounter: Payer: Self-pay | Admitting: Cardiovascular Disease

## 2015-06-14 DIAGNOSIS — E538 Deficiency of other specified B group vitamins: Secondary | ICD-10-CM | POA: Diagnosis not present

## 2015-07-02 ENCOUNTER — Other Ambulatory Visit: Payer: Medicare Other

## 2015-07-02 DIAGNOSIS — D3131 Benign neoplasm of right choroid: Secondary | ICD-10-CM | POA: Diagnosis not present

## 2015-07-02 DIAGNOSIS — H353222 Exudative age-related macular degeneration, left eye, with inactive choroidal neovascularization: Secondary | ICD-10-CM | POA: Diagnosis not present

## 2015-07-03 ENCOUNTER — Inpatient Hospital Stay: Payer: Medicare Other | Attending: Oncology

## 2015-07-03 DIAGNOSIS — D649 Anemia, unspecified: Secondary | ICD-10-CM

## 2015-07-03 DIAGNOSIS — D509 Iron deficiency anemia, unspecified: Secondary | ICD-10-CM | POA: Diagnosis not present

## 2015-07-03 LAB — CBC WITH DIFFERENTIAL/PLATELET
BASOS ABS: 0 10*3/uL (ref 0–0.1)
BASOS PCT: 0 %
EOS ABS: 0.2 10*3/uL (ref 0–0.7)
EOS PCT: 2 %
HCT: 37.5 % (ref 35.0–47.0)
HEMOGLOBIN: 12.4 g/dL (ref 12.0–16.0)
Lymphocytes Relative: 24 %
Lymphs Abs: 2.1 10*3/uL (ref 1.0–3.6)
MCH: 29.5 pg (ref 26.0–34.0)
MCHC: 33 g/dL (ref 32.0–36.0)
MCV: 89.2 fL (ref 80.0–100.0)
Monocytes Absolute: 0.8 10*3/uL (ref 0.2–0.9)
Monocytes Relative: 10 %
NEUTROS PCT: 64 %
Neutro Abs: 5.4 10*3/uL (ref 1.4–6.5)
PLATELETS: 248 10*3/uL (ref 150–440)
RBC: 4.2 MIL/uL (ref 3.80–5.20)
RDW: 23.1 % — ABNORMAL HIGH (ref 11.5–14.5)
WBC: 8.5 10*3/uL (ref 3.6–11.0)

## 2015-07-10 DIAGNOSIS — E7801 Familial hypercholesterolemia: Secondary | ICD-10-CM | POA: Diagnosis not present

## 2015-07-10 DIAGNOSIS — I10 Essential (primary) hypertension: Secondary | ICD-10-CM | POA: Diagnosis not present

## 2015-07-10 DIAGNOSIS — E1165 Type 2 diabetes mellitus with hyperglycemia: Secondary | ICD-10-CM | POA: Diagnosis not present

## 2015-07-10 DIAGNOSIS — E079 Disorder of thyroid, unspecified: Secondary | ICD-10-CM | POA: Diagnosis not present

## 2015-07-17 DIAGNOSIS — E7801 Familial hypercholesterolemia: Secondary | ICD-10-CM | POA: Diagnosis not present

## 2015-07-17 DIAGNOSIS — R0600 Dyspnea, unspecified: Secondary | ICD-10-CM | POA: Diagnosis not present

## 2015-07-17 DIAGNOSIS — E1165 Type 2 diabetes mellitus with hyperglycemia: Secondary | ICD-10-CM | POA: Diagnosis not present

## 2015-07-17 DIAGNOSIS — R0609 Other forms of dyspnea: Secondary | ICD-10-CM | POA: Diagnosis not present

## 2015-07-17 DIAGNOSIS — E538 Deficiency of other specified B group vitamins: Secondary | ICD-10-CM | POA: Diagnosis not present

## 2015-07-17 DIAGNOSIS — I2510 Atherosclerotic heart disease of native coronary artery without angina pectoris: Secondary | ICD-10-CM | POA: Diagnosis not present

## 2015-07-24 ENCOUNTER — Other Ambulatory Visit: Payer: Self-pay | Admitting: *Deleted

## 2015-07-24 MED ORDER — SIMVASTATIN 40 MG PO TABS: 40.0000 mg | ORAL_TABLET | Freq: Every day | ORAL | Status: DC

## 2015-08-01 ENCOUNTER — Inpatient Hospital Stay: Payer: Medicare Other

## 2015-08-01 ENCOUNTER — Inpatient Hospital Stay (HOSPITAL_BASED_OUTPATIENT_CLINIC_OR_DEPARTMENT_OTHER): Payer: Medicare Other | Admitting: Oncology

## 2015-08-01 ENCOUNTER — Inpatient Hospital Stay: Payer: Medicare Other | Attending: Oncology

## 2015-08-01 VITALS — BP 138/69 | HR 62 | Temp 97.8°F | Resp 20

## 2015-08-01 VITALS — BP 165/61 | HR 60

## 2015-08-01 DIAGNOSIS — E669 Obesity, unspecified: Secondary | ICD-10-CM | POA: Diagnosis not present

## 2015-08-01 DIAGNOSIS — R531 Weakness: Secondary | ICD-10-CM | POA: Diagnosis not present

## 2015-08-01 DIAGNOSIS — R5383 Other fatigue: Secondary | ICD-10-CM | POA: Insufficient documentation

## 2015-08-01 DIAGNOSIS — Z7982 Long term (current) use of aspirin: Secondary | ICD-10-CM | POA: Diagnosis not present

## 2015-08-01 DIAGNOSIS — E119 Type 2 diabetes mellitus without complications: Secondary | ICD-10-CM | POA: Diagnosis not present

## 2015-08-01 DIAGNOSIS — I2510 Atherosclerotic heart disease of native coronary artery without angina pectoris: Secondary | ICD-10-CM | POA: Diagnosis not present

## 2015-08-01 DIAGNOSIS — I10 Essential (primary) hypertension: Secondary | ICD-10-CM

## 2015-08-01 DIAGNOSIS — Z7984 Long term (current) use of oral hypoglycemic drugs: Secondary | ICD-10-CM | POA: Diagnosis not present

## 2015-08-01 DIAGNOSIS — Z803 Family history of malignant neoplasm of breast: Secondary | ICD-10-CM | POA: Insufficient documentation

## 2015-08-01 DIAGNOSIS — Z79899 Other long term (current) drug therapy: Secondary | ICD-10-CM | POA: Diagnosis not present

## 2015-08-01 DIAGNOSIS — I5032 Chronic diastolic (congestive) heart failure: Secondary | ICD-10-CM

## 2015-08-01 DIAGNOSIS — R0602 Shortness of breath: Secondary | ICD-10-CM | POA: Diagnosis not present

## 2015-08-01 DIAGNOSIS — E039 Hypothyroidism, unspecified: Secondary | ICD-10-CM | POA: Diagnosis not present

## 2015-08-01 DIAGNOSIS — M13862 Other specified arthritis, left knee: Secondary | ICD-10-CM | POA: Diagnosis not present

## 2015-08-01 DIAGNOSIS — R32 Unspecified urinary incontinence: Secondary | ICD-10-CM

## 2015-08-01 DIAGNOSIS — M13861 Other specified arthritis, right knee: Secondary | ICD-10-CM | POA: Insufficient documentation

## 2015-08-01 DIAGNOSIS — D649 Anemia, unspecified: Secondary | ICD-10-CM

## 2015-08-01 DIAGNOSIS — K449 Diaphragmatic hernia without obstruction or gangrene: Secondary | ICD-10-CM

## 2015-08-01 DIAGNOSIS — D500 Iron deficiency anemia secondary to blood loss (chronic): Secondary | ICD-10-CM

## 2015-08-01 DIAGNOSIS — D509 Iron deficiency anemia, unspecified: Secondary | ICD-10-CM | POA: Diagnosis not present

## 2015-08-01 LAB — CBC WITH DIFFERENTIAL/PLATELET
Basophils Absolute: 0 10*3/uL (ref 0–0.1)
Basophils Relative: 1 %
EOS ABS: 0.1 10*3/uL (ref 0–0.7)
EOS PCT: 2 %
HCT: 32.6 % — ABNORMAL LOW (ref 35.0–47.0)
Hemoglobin: 10.7 g/dL — ABNORMAL LOW (ref 12.0–16.0)
LYMPHS ABS: 1.9 10*3/uL (ref 1.0–3.6)
Lymphocytes Relative: 23 %
MCH: 28.5 pg (ref 26.0–34.0)
MCHC: 32.6 g/dL (ref 32.0–36.0)
MCV: 87.4 fL (ref 80.0–100.0)
MONO ABS: 1 10*3/uL — AB (ref 0.2–0.9)
MONOS PCT: 12 %
Neutro Abs: 5.4 10*3/uL (ref 1.4–6.5)
Neutrophils Relative %: 64 %
PLATELETS: 324 10*3/uL (ref 150–440)
RBC: 3.73 MIL/uL — ABNORMAL LOW (ref 3.80–5.20)
RDW: 17.3 % — ABNORMAL HIGH (ref 11.5–14.5)
WBC: 8.5 10*3/uL (ref 3.6–11.0)

## 2015-08-01 LAB — IRON AND TIBC
Iron: 18 ug/dL — ABNORMAL LOW (ref 28–170)
Saturation Ratios: 5 % — ABNORMAL LOW (ref 10.4–31.8)
TIBC: 403 ug/dL (ref 250–450)
UIBC: 385 ug/dL

## 2015-08-01 LAB — FERRITIN: FERRITIN: 16 ng/mL (ref 11–307)

## 2015-08-01 MED ORDER — SODIUM CHLORIDE 0.9 % IV SOLN
510.0000 mg | Freq: Once | INTRAVENOUS | Status: AC
  Administered 2015-08-01: 510 mg via INTRAVENOUS
  Filled 2015-08-01: qty 17

## 2015-08-01 MED ORDER — SODIUM CHLORIDE 0.9 % IV SOLN
Freq: Once | INTRAVENOUS | Status: AC
  Administered 2015-08-01: 13:00:00 via INTRAVENOUS
  Filled 2015-08-01: qty 1000

## 2015-08-01 NOTE — Progress Notes (Signed)
Patient is feeling an increase in her fatigue and SOBr.

## 2015-08-02 ENCOUNTER — Other Ambulatory Visit: Payer: Medicare Other

## 2015-08-02 ENCOUNTER — Ambulatory Visit: Payer: Medicare Other | Admitting: Oncology

## 2015-08-02 ENCOUNTER — Ambulatory Visit: Payer: Medicare Other

## 2015-08-05 NOTE — Progress Notes (Addendum)
[facility]  [phone] [phone]) [phone]  ID: Mellissa Kohut Deloach OB: 04/14/1927  MR#: [id number]  DXI#:[id number]  Patient Care Team: Leonel Ramsay, MD as PCP - General (Infectious Diseases) Minna Merritts, MD as Consulting Physician (Cardiology)  CHIEF COMPLAINT: Iron deficiency anemia.  INTERVAL HISTORY: Patient returns to clinic for evaluation of anemia. She continues to have complaints of increasing shortness of breath and weakness and fatigue. She has no neurologic complaints. She denies any recent fevers or illnesses. She denies any nausea, vomiting, constipation, or diarrhea. She has no urinary complaints. Patient otherwise feels well and offers no further specific complaints.  REVIEW OF SYSTEMS:   Review of Systems  Constitutional: Positive for malaise/fatigue.  HENT: Negative.   Respiratory: Positive for shortness of breath. Negative for cough and hemoptysis.   Cardiovascular: Negative.   Gastrointestinal: Negative.  Negative for blood in stool and melena.  Genitourinary: Negative.  Negative for hematuria.  Musculoskeletal: Negative.   Neurological: Negative.     As per HPI. Otherwise, a complete review of systems is negatve.  PAST MEDICAL HISTORY: Past Medical History  Diagnosis Date  . Diabetes mellitus     Type II  . Thyroid disease     hypothyroidism  . Hypertension   . Hiatal hernia   . Menopausal symptoms   . Bladder incontinence   . Degenerative arthritis of knee     bilateral knees  . Iron deficiency   . Chronic diastolic (congestive) heart failure ([place])     a. Lexiscan 2013: no ischemia, normal EF  . CAD (coronary artery disease)     a. cardiac cath 05/2009: proximal LAD 90% stenosis s/p PCI/Xience 2.75 x 12 mm DES, mid LAD 40%, diagonal 40%, proximal LCx 40% followed by 40% LCx lesion, 30%-40%-30% lesion noted in the RCA with distal RCA 30% and 25% lesions  . Morbid obesity ([place])     PAST SURGICAL  HISTORY: Past Surgical History  Procedure Laterality Date  . Replacement total knee bilateral    . Total vaginal hysterectomy      ovarian mass, not cancerous  . Upper gi endoscopy  2015  . Colonoscopy  2015    FAMILY HISTORY Family History  Problem Relation Age of Onset  . Breast cancer Mother        ADVANCED DIRECTIVES:    HEALTH MAINTENANCE: Social History  Substance Use Topics  . Smoking status: Never Smoker   . Smokeless tobacco: Not on file  . Alcohol Use: No     Colonoscopy:  PAP:  Bone density:  Lipid panel:  Allergies  Allergen Reactions  . Atenolol     Other reaction(s): Other (See Comments) Decreased heart rate  . Codeine     Rash, difficulty breathing, nausea.    Current Outpatient Prescriptions  Medication Sig Dispense Refill  . amLODipine (NORVASC) 5 MG tablet TAKE ONE (1) TABLET BY MOUTH EVERY DAY FOR BLOOD PRESSURE 90 tablet 3  . aspirin 81 MG tablet Take 81 mg by mouth daily.    . cloNIDine (CATAPRES - DOSED IN MG/24 HR) 0.3 mg/24hr patch     . clopidogrel (PLAVIX) 75 MG tablet Take 1 tablet (75 mg total) by mouth daily. 90 tablet 3  . cyanocobalamin (,VITAMIN B-12,) 1000 MCG/ML injection     . fluticasone (FLONASE) 50 MCG/ACT nasal spray Place into the nose.    . furosemide (LASIX) 20 MG tablet Take 1 tablet (20 mg total) by mouth 2 (two) times daily as needed.  180 tablet 3  . glimepiride (AMARYL) 4 MG tablet Take 4 mg by mouth daily before breakfast.    . levothyroxine (SYNTHROID, LEVOTHROID) 125 MCG tablet Take 125 mcg by mouth daily.    Marland Kitchen losartan-hydrochlorothiazide (HYZAAR) 100-25 MG per tablet Take 1 tablet by mouth daily.      . metFORMIN (GLUCOPHAGE) 1000 MG tablet Take 1,000 mg by mouth 2 (two) times daily with a meal.    . Multiple Vitamins-Minerals (PRESERVISION AREDS PO) Take by mouth 2 (two) times daily.    Marland Kitchen oxybutynin (DITROPAN) 5 MG tablet Take 5 mg by mouth 1 dose over 46 hours.      . pantoprazole (PROTONIX) 40 MG tablet  Take 40 mg by mouth daily.     . pioglitazone (ACTOS) 15 MG tablet Take 15 mg by mouth daily.     . simvastatin (ZOCOR) 40 MG tablet Take 1 tablet (40 mg total) by mouth at bedtime. 90 tablet 3  . simvastatin (ZOCOR) 40 MG tablet Take 1 tablet (40 mg total) by mouth at bedtime. 90 tablet 3  . SYMBICORT 160-4.5 MCG/ACT inhaler Inhale 2 puffs into the lungs 2 (two) times daily.      No current facility-administered medications for this visit.    OBJECTIVE: Filed Vitals:   08/01/15 1124  BP: 138/69  Pulse: 62  Temp: 97.8 F (36.6 C)  Resp: 20     There is no weight on file to calculate BMI.    ECOG FS:2 - Symptomatic, <50% confined to bed  General: Well-developed, well-nourished, no acute distress. Eyes: Pink conjunctiva, anicteric sclera. Lungs: Scattered wheezing. Heart: Regular rate and rhythm. No rubs, murmurs, or gallops. Abdomen: Soft, nontender, nondistended. No organomegaly noted, normoactive bowel sounds. Musculoskeletal: No edema, cyanosis, or clubbing. Neuro: Alert, answering all questions appropriately. Cranial nerves grossly intact. Skin: No rashes or petechiae noted. Psych: Normal affect.  LAB RESULTS:  Lab Results  Component Value Date   NA 137 03/29/2015   K 4.6 03/29/2015   CL 101 03/29/2015   CO2 26 03/29/2015   GLUCOSE 207* 03/29/2015   BUN 38* 03/29/2015   CREATININE 1.42* 03/29/2015   CALCIUM 8.8* 03/29/2015   PROT 6.2* 10/11/2013   ALBUMIN 2.8* 10/11/2013   AST 30 10/11/2013   ALT 30 10/11/2013   ALKPHOS 69 10/11/2013   BILITOT 0.2 10/11/2013   GFRNONAA 32* 03/29/2015   GFRAA 38* 03/29/2015    Lab Results  Component Value Date   WBC 8.5 08/01/2015   NEUTROABS 5.4 08/01/2015   HGB 10.7* 08/01/2015   HCT 32.6* 08/01/2015   MCV 87.4 08/01/2015   PLT 324 08/01/2015   Lab Results  Component Value Date   IRON 18* 08/01/2015   TIBC 403 08/01/2015   IRONPCTSAT 5* 08/01/2015    Lab Results  Component Value Date   FERRITIN 16 08/01/2015     STUDIES: No results found.  ASSESSMENT:  Iron deficiency anemia, shortness of breath.   PLAN:    1. Iron deficiency anemia: Previously, patient was reported to be intolerant of oral iron supplementation. She also had an EGD and colonoscopy in May 2015 for heme positive stools but only revealed esophagitis and tubular adenomas in her colon. Patient's hemoglobin and iron stores continue to be decreased. Proceed with 510 mg IV Feraheme and then return next week for second infusion. Return to clinic monthly for laboratory and in 3 months with repeat laboratory work and further evaluation. 2. Shortness of breath:  Continue treatment per PCP.  Patient expressed understanding and was in agreement with this plan. She also understands that She can call clinic at any time with any questions, concerns, or complaints.    Lloyd Huger, MD   08/05/2015 5:59 PM

## 2015-08-08 ENCOUNTER — Inpatient Hospital Stay: Payer: Medicare Other

## 2015-08-08 VITALS — BP 124/68 | HR 61

## 2015-08-08 DIAGNOSIS — Z79899 Other long term (current) drug therapy: Secondary | ICD-10-CM | POA: Diagnosis not present

## 2015-08-08 DIAGNOSIS — D500 Iron deficiency anemia secondary to blood loss (chronic): Secondary | ICD-10-CM

## 2015-08-08 DIAGNOSIS — R0602 Shortness of breath: Secondary | ICD-10-CM | POA: Diagnosis not present

## 2015-08-08 DIAGNOSIS — R5383 Other fatigue: Secondary | ICD-10-CM | POA: Diagnosis not present

## 2015-08-08 DIAGNOSIS — R531 Weakness: Secondary | ICD-10-CM | POA: Diagnosis not present

## 2015-08-08 DIAGNOSIS — E119 Type 2 diabetes mellitus without complications: Secondary | ICD-10-CM | POA: Diagnosis not present

## 2015-08-08 DIAGNOSIS — D509 Iron deficiency anemia, unspecified: Secondary | ICD-10-CM | POA: Diagnosis not present

## 2015-08-08 MED ORDER — SODIUM CHLORIDE 0.9 % IV SOLN
510.0000 mg | Freq: Once | INTRAVENOUS | Status: AC
  Administered 2015-08-08: 510 mg via INTRAVENOUS
  Filled 2015-08-08: qty 17

## 2015-08-08 MED ORDER — SODIUM CHLORIDE 0.9 % IV SOLN
Freq: Once | INTRAVENOUS | Status: AC
  Administered 2015-08-08: 11:00:00 via INTRAVENOUS
  Filled 2015-08-08: qty 1000

## 2015-08-17 DIAGNOSIS — E538 Deficiency of other specified B group vitamins: Secondary | ICD-10-CM | POA: Diagnosis not present

## 2015-08-29 ENCOUNTER — Other Ambulatory Visit: Payer: Medicare Other

## 2015-09-04 ENCOUNTER — Ambulatory Visit: Payer: Medicare Other

## 2015-09-04 ENCOUNTER — Other Ambulatory Visit: Payer: Self-pay | Admitting: Cardiovascular Disease

## 2015-09-04 DIAGNOSIS — I6523 Occlusion and stenosis of bilateral carotid arteries: Secondary | ICD-10-CM | POA: Diagnosis not present

## 2015-09-05 ENCOUNTER — Inpatient Hospital Stay: Payer: Medicare Other | Attending: Oncology

## 2015-09-05 DIAGNOSIS — D500 Iron deficiency anemia secondary to blood loss (chronic): Secondary | ICD-10-CM | POA: Insufficient documentation

## 2015-09-05 DIAGNOSIS — D509 Iron deficiency anemia, unspecified: Secondary | ICD-10-CM

## 2015-09-05 LAB — HEMOGLOBIN: HEMOGLOBIN: 12.4 g/dL (ref 12.0–16.0)

## 2015-09-11 ENCOUNTER — Ambulatory Visit (INDEPENDENT_AMBULATORY_CARE_PROVIDER_SITE_OTHER): Payer: Medicare Other | Admitting: Cardiovascular Disease

## 2015-09-11 ENCOUNTER — Encounter: Payer: Self-pay | Admitting: Cardiovascular Disease

## 2015-09-11 VITALS — BP 173/73 | HR 67 | Ht 62.0 in | Wt 251.0 lb

## 2015-09-11 DIAGNOSIS — E1165 Type 2 diabetes mellitus with hyperglycemia: Secondary | ICD-10-CM

## 2015-09-11 DIAGNOSIS — IMO0002 Reserved for concepts with insufficient information to code with codable children: Secondary | ICD-10-CM

## 2015-09-11 DIAGNOSIS — I5032 Chronic diastolic (congestive) heart failure: Secondary | ICD-10-CM

## 2015-09-11 DIAGNOSIS — R0602 Shortness of breath: Secondary | ICD-10-CM

## 2015-09-11 DIAGNOSIS — I2510 Atherosclerotic heart disease of native coronary artery without angina pectoris: Secondary | ICD-10-CM | POA: Diagnosis not present

## 2015-09-11 DIAGNOSIS — I6523 Occlusion and stenosis of bilateral carotid arteries: Secondary | ICD-10-CM | POA: Diagnosis not present

## 2015-09-11 DIAGNOSIS — E1159 Type 2 diabetes mellitus with other circulatory complications: Secondary | ICD-10-CM

## 2015-09-11 DIAGNOSIS — I10 Essential (primary) hypertension: Secondary | ICD-10-CM | POA: Diagnosis not present

## 2015-09-11 DIAGNOSIS — I209 Angina pectoris, unspecified: Secondary | ICD-10-CM

## 2015-09-11 MED ORDER — DOXAZOSIN MESYLATE 8 MG PO TABS: 8.0000 mg | ORAL_TABLET | Freq: Every day | ORAL | Status: DC

## 2015-09-11 NOTE — Progress Notes (Signed)
Patient ID: Amy [name], female   DOB: [birth year], 82 y.o.   MRN: [id number] Cardiology Office Note  Date:  09/11/2015   ID:  Amy [name], DOB [birth year], MRN [id number]  PCP:  Leonel Ramsay, MD   Chief Complaint  Patient presents with  . other    3 month f/u c/o sob. Meds reviewed verbally with pt.    HPI:  Amy [name] is a pleasant 82 year old woman with a history of coronary artery disease, cardiac catheterization confirming severe stenosis of her proximal LAD with stent placed, problems with incontinence, who now presents for routine follow up of her coronary artery disease Beta blocker has been held in the past secondary to bradycardia.  lost her daughter, previous adjustment disorder  In follow-up today, she reports worsening shortness of breath Recent iron infusion for iron deficiency anemia HBG back to 12,  Still SOB on exertion, leg swelling worse bilaterally, particularly on the left. Both legs with pitting edema Total chol 148 BMP in 03/2015, creatinine 1.4, creatinine 1.2 in 07/10/2015 HBA1C 6  Takes lasix 40 twice a week, HCTZ daily No energy to do anything, no regular exercise program, legs feel weak. Presenting today in a wheelchair Echo 02/2015 EF 60%, mild AS  Long discussion concerning her diet, fluid intake. She denies any junk food, eating out at restaurants, try stool avoid salt. Denies excessive fluid intake  Other past medical history Previous hemoglobin A1c 7.4 TSH 10.8. She reports that her thyroid medication dosing was changed by primary care Total cholesterol in March 2015 was [facility] records reviewed from August 2015. She had diarrhea, vomiting, felt to be viral also with acute renal failure that improved with hydration. Creatinine reached 1.86, one month later creatinine 1.0 as an outpatient Continues to be weak, does not walk much and is deconditioned  Guaiac positive and she had colonoscopy and EGD. She reports Dr.  Vira Agar did not find anything Iron transfusions in the past under the direction of Dr. Ma Hillock.   Prior episode of shortness of breath while walking in the hospital to schedule a colonoscopy appointment. sent to the emergency room for further evaluation. Systolic pressure was in the 170 range. workup at that time was essentially normal and she was discharged home.  cardiac [facility] on [facility] 22, 2009 details 90% proximal LAD, 40% mid LAD, 40% diagonal, 60% proximal circumflex followed by 40% circumflex lesion, 30%, 40% and 30% lesion noted in the RCA with distal RCA with 30% and 25% lesions. Mild atheroma in the aorta. Xience 2.75 x 12 mm Drug eluting stent placed.   stress test 01/14/2012 showing no ischemia, Lexiscan  PMH:   has a past medical history of Diabetes mellitus; Thyroid disease; Hypertension; Hiatal hernia; Menopausal symptoms; Bladder incontinence; Degenerative arthritis of knee; Iron deficiency; Chronic diastolic (congestive) heart failure (HCC); CAD (coronary artery disease); and Morbid obesity ([place]).  PSH:    Past Surgical History  Procedure Laterality Date  . Replacement total knee bilateral    . Total vaginal hysterectomy      ovarian mass, not cancerous  . Upper gi endoscopy  2015  . Colonoscopy  2015    Current Outpatient Prescriptions  Medication Sig Dispense Refill  . amLODipine (NORVASC) 5 MG tablet TAKE ONE (1) TABLET BY MOUTH EVERY DAY FOR BLOOD PRESSURE 90 tablet 3  . aspirin 81 MG tablet Take 81 mg by mouth daily.    . cloNIDine (CATAPRES - DOSED IN MG/24 HR) 0.3 mg/24hr  patch Place 0.3 mg onto the skin once a week.     . clopidogrel (PLAVIX) 75 MG tablet Take 1 tablet (75 mg total) by mouth daily. 90 tablet 3  . cyanocobalamin (,VITAMIN B-12,) 1000 MCG/ML injection     . fluticasone (FLONASE) 50 MCG/ACT nasal spray Place into the nose.    . furosemide (LASIX) 20 MG tablet Take 1 tablet (20 mg total) by mouth 2 (two) times daily as needed.  180 tablet 3  . glimepiride (AMARYL) 4 MG tablet Take 4 mg by mouth daily before breakfast.    . levothyroxine (SYNTHROID, LEVOTHROID) 125 MCG tablet Take 125 mcg by mouth daily.    Marland Kitchen losartan-hydrochlorothiazide (HYZAAR) 100-25 MG per tablet Take 1 tablet by mouth daily.      . metFORMIN (GLUCOPHAGE) 1000 MG tablet Take 1,000 mg by mouth 2 (two) times daily with a meal.    . Multiple Vitamins-Minerals (PRESERVISION AREDS PO) Take by mouth 2 (two) times daily.    Marland Kitchen oxybutynin (DITROPAN) 5 MG tablet Take 5 mg by mouth 1 dose over 46 hours.      . pantoprazole (PROTONIX) 40 MG tablet Take 40 mg by mouth daily.     . pioglitazone (ACTOS) 15 MG tablet Take 15 mg by mouth daily.     . simvastatin (ZOCOR) 40 MG tablet Take 1 tablet (40 mg total) by mouth at bedtime. 90 tablet 3  . SYMBICORT 160-4.5 MCG/ACT inhaler Inhale 2 puffs into the lungs 2 (two) times daily.     Marland Kitchen doxazosin (CARDURA) 8 MG tablet Take 1 tablet (8 mg total) by mouth daily. 90 tablet 4   No current facility-administered medications for this visit.     Allergies:   Atenolol and Codeine   Social History:  The patient  reports that she has never smoked. She does not have any smokeless tobacco history on file. She reports that she does not drink alcohol or use illicit drugs.   Family History:   family history includes Breast cancer in her mother.    Review of Systems: Review of Systems  Constitutional: Positive for malaise/fatigue.  Respiratory: Positive for shortness of breath.   Cardiovascular: Positive for leg swelling.  Gastrointestinal: Negative.   Musculoskeletal: Negative.   Neurological: Positive for weakness.  Psychiatric/Behavioral: Negative.   All other systems reviewed and are negative.    PHYSICAL EXAM: VS:  BP 173/73 mmHg  Pulse 67  Ht '5\' 2"'$  (1.575 m)  Wt 251 lb (113.853 kg)  BMI 45.90 kg/m2 , BMI Body mass index is 45.9 kg/(m^2). GEN: Well nourished, well developed, in no acute distress, morbidly  obese HEENT: normal Neck: no JVD, carotid bruits, or masses Cardiac: RRR; no murmurs, rubs, or gallops, trace to 1+ pitting edema to below the knees bilaterally Respiratory:  clear to auscultation bilaterally, normal work of breathing GI: soft, nontender, nondistended, + BS MS: no deformity or atrophy Skin: warm and dry, no rash Neuro:  Strength and sensation are intact Psych: euthymic mood, full affect    Recent Labs: 03/29/2015: BUN 38*; Creatinine, Ser 1.42*; Potassium 4.6; Sodium 137 08/01/2015: Platelets 324 09/05/2015: Hemoglobin 12.4    Lipid Panel Lab Results  Component Value Date   CHOL 135 06/25/2010   HDL 35* 06/25/2010   LDLCALC 56 06/25/2010   TRIG 220* 06/25/2010      Wt Readings from Last 3 Encounters:  09/11/15 251 lb (113.853 kg)  06/05/15 244 lb 12 oz (111.018 kg)  03/21/15 244 lb (110.678 kg)  ASSESSMENT AND PLAN:  Essential hypertension - Plan: AMB referral to cardiac rehabilitation Blood pressure elevated on today's visit We'll avoid higher dose amlodipine given her leg swelling Already on losartan, HCTZ, clonidine Recommended she add Cardura 8 mg daily with close monitoring of her blood pressure at home Suggested she call our office with blood pressure numbers Other options include hydralazine, isosorbide  CAD in native artery - Plan: AMB referral to cardiac rehabilitation Currently with no symptoms of angina. No further workup at this time. Continue current medication regimen.  Chronic diastolic CHF (congestive heart failure) ([place]) - Plan: AMB referral to cardiac rehabilitation Acute on chronic diastolic CHF on today's visit Weight continues to run high, pitting edema on exam Suspect she will need more than Lasix twice per week Recommended she use this daily for 3-4 days until weight trends back down to her baseline 244 pounds Weight is up approximately 7 pounds from her baseline We'll recommend cardiac rehabilitation  Angina pectoris  associated with type 2 diabetes mellitus ([place]) - Plan: AMB referral to cardiac rehabilitation Denies any chest pain concerning for angina  Uncontrolled type 2 diabetes mellitus with other circulatory complication (HCC) Recent hemoglobin A1c well controlled,  Managed by primary care  Morbid obesity, unspecified obesity type ([place]) Long-standing history of obesity likely contributing to her general deconditioning, inability to exert herself She would benefit from cardiac rehabilitation. This was discussed with her, referral has been placed  Shortness of breath Likely multifactorial including morbid obesity, deconditioning, CHF   Total encounter time more than 25 minutes  Greater than 50% was spent in counseling and coordination of care with the patient   Disposition:   F/U  6 months   Orders Placed This Encounter  Procedures  . AMB referral to cardiac rehabilitation     Signed, Esmond Plants, M.D., Ph.D. 09/11/2015  [place], [place]

## 2015-09-11 NOTE — Patient Instructions (Addendum)
Medication Instructions:   Take lasix three days in a row, Take lasix as needed, not just 2 x a week  Please start cardura/doxasozin one pill  in the evening for blood pressure  Labwork:  No new labs today  Testing/Procedures:  We will arrange cardiac rehab  Follow-Up: It was a pleasure seeing you in the office today. Please call us if you have new issues that need to be addressed before your next appt.  [phone]  Your physician wants you to follow-up in: 2 months.  You will receive a reminder letter in the mail two months in advance. If you don't receive a letter, please call our office to schedule the follow-up appointment.  If you need a refill on your cardiac medications before your next appointment, please call your pharmacy.

## 2015-09-18 DIAGNOSIS — E538 Deficiency of other specified B group vitamins: Secondary | ICD-10-CM | POA: Diagnosis not present

## 2015-09-24 ENCOUNTER — Telehealth: Payer: Self-pay | Admitting: Cardiovascular Disease

## 2015-09-24 DIAGNOSIS — H353222 Exudative age-related macular degeneration, left eye, with inactive choroidal neovascularization: Secondary | ICD-10-CM | POA: Diagnosis not present

## 2015-09-24 NOTE — Telephone Encounter (Signed)
Patient called today regarding her new medication    doxazosin (CARDURA) 8 MG tablet  . She had her bp taken today at the [facility] and it was 110/40. Patient not sure what to do?

## 2015-09-24 NOTE — Telephone Encounter (Signed)
Spoke w/ pt.  She states that she does not feel symptomatic, she is SOB and has occasional dizziness as her baseline. She has a wrist monitor at home, but it is about 80 yrs old and the battery is dead. The reading at the [facility] was done on a wrist monitor - was not checked manually and was not rechecked. Advised her to try to obtain an arm monitor and that she come by the office for a BP check, go to her local pharmacy or fire dept. She has another appt in a different office next week, she will compare readings at that time, as well. Asked pt to call if she becomes symptomatic (or sx worsen) or if her readings are consistently low.  She is appreciative of the call and had no further questions.

## 2015-09-26 ENCOUNTER — Other Ambulatory Visit: Payer: Medicare Other

## 2015-10-01 ENCOUNTER — Inpatient Hospital Stay: Payer: Medicare Other

## 2015-10-01 ENCOUNTER — Inpatient Hospital Stay
Admission: EM | Admit: 2015-10-01 | Discharge: 2015-10-11 | DRG: 190 | Disposition: A | Discharge status: Skilled Nursing Facility | Payer: Medicare Other | Attending: Specialist | Admitting: Specialist

## 2015-10-01 ENCOUNTER — Telehealth: Payer: Self-pay | Admitting: Cardiovascular Disease

## 2015-10-01 ENCOUNTER — Encounter: Payer: Self-pay | Admitting: Emergency Medicine

## 2015-10-01 ENCOUNTER — Emergency Department: Payer: Medicare Other

## 2015-10-01 DIAGNOSIS — N183 Chronic kidney disease, stage 3 (moderate): Secondary | ICD-10-CM | POA: Diagnosis present

## 2015-10-01 DIAGNOSIS — I130 Hypertensive heart and chronic kidney disease with heart failure and stage 1 through stage 4 chronic kidney disease, or unspecified chronic kidney disease: Secondary | ICD-10-CM | POA: Diagnosis present

## 2015-10-01 DIAGNOSIS — J449 Chronic obstructive pulmonary disease, unspecified: Secondary | ICD-10-CM

## 2015-10-01 DIAGNOSIS — I10 Essential (primary) hypertension: Secondary | ICD-10-CM | POA: Diagnosis not present

## 2015-10-01 DIAGNOSIS — Z7951 Long term (current) use of inhaled steroids: Secondary | ICD-10-CM

## 2015-10-01 DIAGNOSIS — M6281 Muscle weakness (generalized): Secondary | ICD-10-CM | POA: Diagnosis not present

## 2015-10-01 DIAGNOSIS — D649 Anemia, unspecified: Secondary | ICD-10-CM | POA: Diagnosis present

## 2015-10-01 DIAGNOSIS — Z955 Presence of coronary angioplasty implant and graft: Secondary | ICD-10-CM

## 2015-10-01 DIAGNOSIS — Z888 Allergy status to other drugs, medicaments and biological substances status: Secondary | ICD-10-CM

## 2015-10-01 DIAGNOSIS — R600 Localized edema: Secondary | ICD-10-CM | POA: Diagnosis not present

## 2015-10-01 DIAGNOSIS — R0602 Shortness of breath: Secondary | ICD-10-CM

## 2015-10-01 DIAGNOSIS — I480 Paroxysmal atrial fibrillation: Secondary | ICD-10-CM | POA: Diagnosis present

## 2015-10-01 DIAGNOSIS — J9811 Atelectasis: Secondary | ICD-10-CM | POA: Diagnosis present

## 2015-10-01 DIAGNOSIS — Z7902 Long term (current) use of antithrombotics/antiplatelets: Secondary | ICD-10-CM

## 2015-10-01 DIAGNOSIS — J9621 Acute and chronic respiratory failure with hypoxia: Secondary | ICD-10-CM | POA: Diagnosis present

## 2015-10-01 DIAGNOSIS — J209 Acute bronchitis, unspecified: Secondary | ICD-10-CM | POA: Diagnosis not present

## 2015-10-01 DIAGNOSIS — E1122 Type 2 diabetes mellitus with diabetic chronic kidney disease: Secondary | ICD-10-CM | POA: Diagnosis present

## 2015-10-01 DIAGNOSIS — I509 Heart failure, unspecified: Secondary | ICD-10-CM | POA: Diagnosis not present

## 2015-10-01 DIAGNOSIS — Z7722 Contact with and (suspected) exposure to environmental tobacco smoke (acute) (chronic): Secondary | ICD-10-CM | POA: Diagnosis present

## 2015-10-01 DIAGNOSIS — R2689 Other abnormalities of gait and mobility: Secondary | ICD-10-CM | POA: Diagnosis not present

## 2015-10-01 DIAGNOSIS — I4891 Unspecified atrial fibrillation: Secondary | ICD-10-CM | POA: Diagnosis not present

## 2015-10-01 DIAGNOSIS — M179 Osteoarthritis of knee, unspecified: Secondary | ICD-10-CM | POA: Diagnosis present

## 2015-10-01 DIAGNOSIS — Z79899 Other long term (current) drug therapy: Secondary | ICD-10-CM

## 2015-10-01 DIAGNOSIS — Z886 Allergy status to analgesic agent status: Secondary | ICD-10-CM | POA: Diagnosis not present

## 2015-10-01 DIAGNOSIS — Z7982 Long term (current) use of aspirin: Secondary | ICD-10-CM | POA: Diagnosis not present

## 2015-10-01 DIAGNOSIS — Z96653 Presence of artificial knee joint, bilateral: Secondary | ICD-10-CM | POA: Diagnosis present

## 2015-10-01 DIAGNOSIS — I5032 Chronic diastolic (congestive) heart failure: Secondary | ICD-10-CM | POA: Diagnosis present

## 2015-10-01 DIAGNOSIS — Z9989 Dependence on other enabling machines and devices: Secondary | ICD-10-CM | POA: Diagnosis not present

## 2015-10-01 DIAGNOSIS — N393 Stress incontinence (female) (male): Secondary | ICD-10-CM | POA: Diagnosis not present

## 2015-10-01 DIAGNOSIS — E785 Hyperlipidemia, unspecified: Secondary | ICD-10-CM | POA: Diagnosis present

## 2015-10-01 DIAGNOSIS — Z6841 Body Mass Index (BMI) 40.0 and over, adult: Secondary | ICD-10-CM | POA: Diagnosis not present

## 2015-10-01 DIAGNOSIS — T17990A Other foreign object in respiratory tract, part unspecified in causing asphyxiation, initial encounter: Secondary | ICD-10-CM | POA: Diagnosis present

## 2015-10-01 DIAGNOSIS — I25810 Atherosclerosis of coronary artery bypass graft(s) without angina pectoris: Secondary | ICD-10-CM | POA: Diagnosis not present

## 2015-10-01 DIAGNOSIS — N179 Acute kidney failure, unspecified: Secondary | ICD-10-CM | POA: Diagnosis not present

## 2015-10-01 DIAGNOSIS — E039 Hypothyroidism, unspecified: Secondary | ICD-10-CM | POA: Diagnosis present

## 2015-10-01 DIAGNOSIS — K449 Diaphragmatic hernia without obstruction or gangrene: Secondary | ICD-10-CM | POA: Diagnosis not present

## 2015-10-01 DIAGNOSIS — I959 Hypotension, unspecified: Secondary | ICD-10-CM | POA: Diagnosis present

## 2015-10-01 DIAGNOSIS — D638 Anemia in other chronic diseases classified elsewhere: Secondary | ICD-10-CM | POA: Diagnosis present

## 2015-10-01 DIAGNOSIS — R918 Other nonspecific abnormal finding of lung field: Secondary | ICD-10-CM

## 2015-10-01 DIAGNOSIS — J440 Chronic obstructive pulmonary disease with acute lower respiratory infection: Principal | ICD-10-CM | POA: Diagnosis present

## 2015-10-01 DIAGNOSIS — I5033 Acute on chronic diastolic (congestive) heart failure: Secondary | ICD-10-CM | POA: Diagnosis present

## 2015-10-01 DIAGNOSIS — T380X5A Adverse effect of glucocorticoids and synthetic analogues, initial encounter: Secondary | ICD-10-CM | POA: Diagnosis present

## 2015-10-01 DIAGNOSIS — R079 Chest pain, unspecified: Secondary | ICD-10-CM

## 2015-10-01 DIAGNOSIS — Z803 Family history of malignant neoplasm of breast: Secondary | ICD-10-CM | POA: Diagnosis not present

## 2015-10-01 DIAGNOSIS — R590 Localized enlarged lymph nodes: Secondary | ICD-10-CM | POA: Diagnosis present

## 2015-10-01 DIAGNOSIS — Z7984 Long term (current) use of oral hypoglycemic drugs: Secondary | ICD-10-CM | POA: Diagnosis not present

## 2015-10-01 DIAGNOSIS — I5030 Unspecified diastolic (congestive) heart failure: Secondary | ICD-10-CM | POA: Diagnosis not present

## 2015-10-01 DIAGNOSIS — Z90710 Acquired absence of both cervix and uterus: Secondary | ICD-10-CM | POA: Diagnosis not present

## 2015-10-01 DIAGNOSIS — R609 Edema, unspecified: Secondary | ICD-10-CM

## 2015-10-01 DIAGNOSIS — I2510 Atherosclerotic heart disease of native coronary artery without angina pectoris: Secondary | ICD-10-CM | POA: Diagnosis present

## 2015-10-01 DIAGNOSIS — Z7901 Long term (current) use of anticoagulants: Secondary | ICD-10-CM | POA: Diagnosis not present

## 2015-10-01 DIAGNOSIS — R32 Unspecified urinary incontinence: Secondary | ICD-10-CM | POA: Diagnosis present

## 2015-10-01 DIAGNOSIS — R41841 Cognitive communication deficit: Secondary | ICD-10-CM | POA: Diagnosis not present

## 2015-10-01 DIAGNOSIS — Z9981 Dependence on supplemental oxygen: Secondary | ICD-10-CM | POA: Diagnosis not present

## 2015-10-01 DIAGNOSIS — E119 Type 2 diabetes mellitus without complications: Secondary | ICD-10-CM | POA: Diagnosis not present

## 2015-10-01 DIAGNOSIS — D509 Iron deficiency anemia, unspecified: Secondary | ICD-10-CM | POA: Diagnosis not present

## 2015-10-01 DIAGNOSIS — K219 Gastro-esophageal reflux disease without esophagitis: Secondary | ICD-10-CM | POA: Diagnosis present

## 2015-10-01 DIAGNOSIS — I110 Hypertensive heart disease with heart failure: Secondary | ICD-10-CM | POA: Diagnosis not present

## 2015-10-01 DIAGNOSIS — J441 Chronic obstructive pulmonary disease with (acute) exacerbation: Secondary | ICD-10-CM | POA: Diagnosis not present

## 2015-10-01 DIAGNOSIS — J189 Pneumonia, unspecified organism: Secondary | ICD-10-CM | POA: Diagnosis not present

## 2015-10-01 HISTORY — DX: Chronic kidney disease, stage 3 (moderate): N18.3

## 2015-10-01 HISTORY — DX: Chronic kidney disease, stage 3 unspecified: N18.30

## 2015-10-01 LAB — COMPREHENSIVE METABOLIC PANEL
ALT: 14 U/L (ref 14–54)
ANION GAP: 7 (ref 5–15)
AST: 18 U/L (ref 15–41)
Albumin: 3.7 g/dL (ref 3.5–5.0)
Alkaline Phosphatase: 81 U/L (ref 38–126)
BUN: 30 mg/dL — ABNORMAL HIGH (ref 6–20)
CHLORIDE: 107 mmol/L (ref 101–111)
CO2: 25 mmol/L (ref 22–32)
CREATININE: 1.27 mg/dL — AB (ref 0.44–1.00)
Calcium: 8.8 mg/dL — ABNORMAL LOW (ref 8.9–10.3)
GFR calc non Af Amer: 37 mL/min — ABNORMAL LOW (ref >60)
GFR, EST AFRICAN AMERICAN: 43 mL/min — AB (ref >60)
Glucose, Bld: 201 mg/dL — ABNORMAL HIGH (ref 65–99)
Potassium: 4.6 mmol/L (ref 3.5–5.1)
SODIUM: 139 mmol/L (ref 135–145)
Total Bilirubin: 0.3 mg/dL (ref 0.3–1.2)
Total Protein: 6.4 g/dL — ABNORMAL LOW (ref 6.5–8.1)

## 2015-10-01 LAB — TROPONIN I
Troponin I: <0.03 ng/mL (ref <0.03)
Troponin I: <0.03 ng/mL (ref <0.03)

## 2015-10-01 LAB — CBC
HEMATOCRIT: 30.8 % — AB (ref 35.0–47.0)
Hemoglobin: 10.4 g/dL — ABNORMAL LOW (ref 12.0–16.0)
MCH: 31.5 pg (ref 26.0–34.0)
MCHC: 33.9 g/dL (ref 32.0–36.0)
MCV: 92.9 fL (ref 80.0–100.0)
Platelets: 195 10*3/uL (ref 150–440)
RBC: 3.31 MIL/uL — ABNORMAL LOW (ref 3.80–5.20)
RDW: 18.3 % — AB (ref 11.5–14.5)
WBC: 9 10*3/uL (ref 3.6–11.0)

## 2015-10-01 LAB — HEMOGLOBIN A1C: Hgb A1c MFr Bld: 5.6 % (ref 4.0–6.0)

## 2015-10-01 LAB — GLUCOSE, CAPILLARY
GLUCOSE-CAPILLARY: 105 mg/dL — AB (ref 65–99)
GLUCOSE-CAPILLARY: 159 mg/dL — AB (ref 65–99)
GLUCOSE-CAPILLARY: 138 mg/dL — AB (ref 65–99)

## 2015-10-01 LAB — URINALYSIS COMPLETE WITH MICROSCOPIC (ARMC ONLY)
BILIRUBIN URINE: NEGATIVE
Bacteria, UA: NONE SEEN
GLUCOSE, UA: NEGATIVE mg/dL
HGB URINE DIPSTICK: NEGATIVE
KETONES UR: NEGATIVE mg/dL
LEUKOCYTES UA: NEGATIVE
NITRITE: NEGATIVE
Protein, ur: NEGATIVE mg/dL
SPECIFIC GRAVITY, URINE: 1.012 (ref 1.005–1.030)
pH: 5 (ref 5.0–8.0)

## 2015-10-01 LAB — BRAIN NATRIURETIC PEPTIDE: B NATRIURETIC PEPTIDE 5: 53 pg/mL (ref 0.0–100.0)

## 2015-10-01 LAB — TSH
TSH: 4.806 u[IU]/mL — AB (ref 0.350–4.500)
TSH: 5.103 u[IU]/mL — ABNORMAL HIGH (ref 0.350–4.500)

## 2015-10-01 MED ORDER — ONDANSETRON HCL 4 MG PO TABS: 4.0000 mg | ORAL_TABLET | Freq: Four times a day (QID) | ORAL | Status: DC | PRN

## 2015-10-01 MED ORDER — IPRATROPIUM-ALBUTEROL 0.5-2.5 (3) MG/3ML IN SOLN
3.0000 mL | RESPIRATORY_TRACT | Status: DC | PRN
  Administered 2015-10-02: 3 mL via RESPIRATORY_TRACT
  Filled 2015-10-01: qty 3

## 2015-10-01 MED ORDER — PIOGLITAZONE HCL 15 MG PO TABS: 15.0000 mg | ORAL_TABLET | Freq: Every day | ORAL | Status: DC

## 2015-10-01 MED ORDER — CLONIDINE HCL 0.3 MG/24HR TD PTWK
0.3000 mg | MEDICATED_PATCH | TRANSDERMAL | Status: DC
  Administered 2015-10-08: 0.3 mg via TRANSDERMAL
  Filled 2015-10-01 (×2): qty 1

## 2015-10-01 MED ORDER — DOXAZOSIN MESYLATE 4 MG PO TABS
8.0000 mg | ORAL_TABLET | Freq: Every day | ORAL | Status: DC
  Administered 2015-10-02 – 2015-10-10 (×8): 8 mg via ORAL
  Filled 2015-10-01 (×10): qty 2

## 2015-10-01 MED ORDER — GLIMEPIRIDE 2 MG PO TABS
4.0000 mg | ORAL_TABLET | Freq: Every day | ORAL | Status: DC
  Administered 2015-10-02 – 2015-10-11 (×10): 4 mg via ORAL
  Filled 2015-10-01 (×10): qty 2

## 2015-10-01 MED ORDER — SENNOSIDES-DOCUSATE SODIUM 8.6-50 MG PO TABS: 1.0000 | ORAL_TABLET | Freq: Every evening | ORAL | Status: DC | PRN

## 2015-10-01 MED ORDER — ACETAMINOPHEN 325 MG PO TABS: 650.0000 mg | ORAL_TABLET | Freq: Four times a day (QID) | ORAL | Status: DC | PRN

## 2015-10-01 MED ORDER — FUROSEMIDE 10 MG/ML IJ SOLN
40.0000 mg | Freq: Three times a day (TID) | INTRAMUSCULAR | Status: DC
  Administered 2015-10-01 – 2015-10-02 (×4): 40 mg via INTRAVENOUS
  Filled 2015-10-01 (×4): qty 4

## 2015-10-01 MED ORDER — FUROSEMIDE 10 MG/ML IJ SOLN
40.0000 mg | Freq: Once | INTRAMUSCULAR | Status: AC
  Administered 2015-10-01: 40 mg via INTRAVENOUS
  Filled 2015-10-01: qty 4

## 2015-10-01 MED ORDER — ENOXAPARIN SODIUM 40 MG/0.4ML ~~LOC~~ SOLN
40.0000 mg | Freq: Two times a day (BID) | SUBCUTANEOUS | Status: DC
  Administered 2015-10-01 – 2015-10-03 (×5): 40 mg via SUBCUTANEOUS
  Filled 2015-10-01 (×5): qty 0.4

## 2015-10-01 MED ORDER — ASPIRIN 81 MG PO CHEW
81.0000 mg | CHEWABLE_TABLET | Freq: Every day | ORAL | Status: DC
  Administered 2015-10-02 – 2015-10-11 (×10): 81 mg via ORAL
  Filled 2015-10-01 (×10): qty 1

## 2015-10-01 MED ORDER — PANTOPRAZOLE SODIUM 40 MG PO TBEC
40.0000 mg | DELAYED_RELEASE_TABLET | Freq: Every day | ORAL | Status: DC
  Administered 2015-10-02 – 2015-10-11 (×10): 40 mg via ORAL
  Filled 2015-10-01 (×10): qty 1

## 2015-10-01 MED ORDER — SODIUM CHLORIDE 0.9% FLUSH: 3.0000 mL | INTRAVENOUS | Status: DC | PRN

## 2015-10-01 MED ORDER — IPRATROPIUM-ALBUTEROL 0.5-2.5 (3) MG/3ML IN SOLN
RESPIRATORY_TRACT | Status: AC
Start: 2015-10-01 — End: 2015-10-01
  Administered 2015-10-01: 3 mL
  Filled 2015-10-01: qty 3

## 2015-10-01 MED ORDER — FUROSEMIDE 10 MG/ML IJ SOLN: 40.0000 mg | Freq: Every day | INTRAMUSCULAR | Status: DC

## 2015-10-01 MED ORDER — SODIUM CHLORIDE 0.9% FLUSH
3.0000 mL | Freq: Two times a day (BID) | INTRAVENOUS | Status: DC
  Administered 2015-10-01 – 2015-10-10 (×17): 3 mL via INTRAVENOUS

## 2015-10-01 MED ORDER — AMLODIPINE BESYLATE 10 MG PO TABS
10.0000 mg | ORAL_TABLET | Freq: Every day | ORAL | Status: DC
  Administered 2015-10-02 – 2015-10-05 (×4): 10 mg via ORAL
  Filled 2015-10-01 (×4): qty 1

## 2015-10-01 MED ORDER — IPRATROPIUM-ALBUTEROL 0.5-2.5 (3) MG/3ML IN SOLN: 3.0000 mL | Freq: Four times a day (QID) | RESPIRATORY_TRACT | Status: DC

## 2015-10-01 MED ORDER — LOSARTAN POTASSIUM-HCTZ 100-25 MG PO TABS: 1.0000 | ORAL_TABLET | Freq: Every day | ORAL | Status: DC

## 2015-10-01 MED ORDER — SODIUM CHLORIDE 0.9 % IV SOLN: 250.0000 mL | INTRAVENOUS | Status: DC | PRN

## 2015-10-01 MED ORDER — HYDROCHLOROTHIAZIDE 25 MG PO TABS
25.0000 mg | ORAL_TABLET | Freq: Every day | ORAL | Status: DC
  Administered 2015-10-02: 25 mg via ORAL
  Filled 2015-10-01: qty 1

## 2015-10-01 MED ORDER — IOPAMIDOL (ISOVUE-370) INJECTION 76%
75.0000 mL | Freq: Once | INTRAVENOUS | Status: AC | PRN
  Administered 2015-10-01: 75 mL via INTRAVENOUS

## 2015-10-01 MED ORDER — ACETAMINOPHEN 650 MG RE SUPP
650.0000 mg | Freq: Four times a day (QID) | RECTAL | Status: DC | PRN
Start: 2015-10-01 — End: 2015-10-11

## 2015-10-01 MED ORDER — ONDANSETRON HCL 4 MG/2ML IJ SOLN: 4.0000 mg | Freq: Four times a day (QID) | INTRAMUSCULAR | Status: DC | PRN

## 2015-10-01 MED ORDER — MOMETASONE FURO-FORMOTEROL FUM 200-5 MCG/ACT IN AERO
2.0000 | INHALATION_SPRAY | Freq: Two times a day (BID) | RESPIRATORY_TRACT | Status: DC
  Administered 2015-10-01 – 2015-10-02 (×2): 2 via RESPIRATORY_TRACT
  Filled 2015-10-01: qty 8.8

## 2015-10-01 MED ORDER — TRAMADOL HCL 50 MG PO TABS: 50.0000 mg | ORAL_TABLET | Freq: Four times a day (QID) | ORAL | Status: DC | PRN

## 2015-10-01 MED ORDER — SODIUM CHLORIDE 0.9% FLUSH
3.0000 mL | Freq: Two times a day (BID) | INTRAVENOUS | Status: DC
  Administered 2015-10-01: 3 mL via INTRAVENOUS

## 2015-10-01 MED ORDER — CLOPIDOGREL BISULFATE 75 MG PO TABS
75.0000 mg | ORAL_TABLET | Freq: Every day | ORAL | Status: DC
  Administered 2015-10-02 – 2015-10-11 (×10): 75 mg via ORAL
  Filled 2015-10-01 (×10): qty 1

## 2015-10-01 MED ORDER — LEVOTHYROXINE SODIUM 125 MCG PO TABS
125.0000 ug | ORAL_TABLET | Freq: Every day | ORAL | Status: DC
  Administered 2015-10-02 – 2015-10-11 (×10): 125 ug via ORAL
  Filled 2015-10-01 (×10): qty 1

## 2015-10-01 MED ORDER — SIMVASTATIN 40 MG PO TABS
40.0000 mg | ORAL_TABLET | Freq: Every day | ORAL | Status: DC
  Administered 2015-10-02 – 2015-10-11 (×10): 40 mg via ORAL
  Filled 2015-10-01 (×10): qty 1

## 2015-10-01 MED ORDER — OXYBUTYNIN CHLORIDE 5 MG PO TABS
5.0000 mg | ORAL_TABLET | Freq: Every day | ORAL | Status: DC
  Administered 2015-10-02 – 2015-10-11 (×9): 5 mg via ORAL
  Filled 2015-10-01 (×11): qty 1

## 2015-10-01 MED ORDER — INSULIN ASPART 100 UNIT/ML ~~LOC~~ SOLN
0.0000 [IU] | Freq: Three times a day (TID) | SUBCUTANEOUS | Status: DC
  Administered 2015-10-01: 4 [IU] via SUBCUTANEOUS
  Administered 2015-10-02: 3 [IU] via SUBCUTANEOUS
  Administered 2015-10-02: 11 [IU] via SUBCUTANEOUS
  Administered 2015-10-02: 3 [IU] via SUBCUTANEOUS
  Administered 2015-10-03 (×3): 4 [IU] via SUBCUTANEOUS
  Administered 2015-10-04: 7 [IU] via SUBCUTANEOUS
  Administered 2015-10-04: 15 [IU] via SUBCUTANEOUS
  Administered 2015-10-05: 4 [IU] via SUBCUTANEOUS
  Administered 2015-10-05: 20 [IU] via SUBCUTANEOUS
  Administered 2015-10-05: 3 [IU] via SUBCUTANEOUS
  Administered 2015-10-06: 20 [IU] via SUBCUTANEOUS
  Administered 2015-10-06: 7 [IU] via SUBCUTANEOUS
  Administered 2015-10-07: 11 [IU] via SUBCUTANEOUS
  Administered 2015-10-07: 7 [IU] via SUBCUTANEOUS
  Administered 2015-10-07: 15 [IU] via SUBCUTANEOUS
  Administered 2015-10-08: 11 [IU] via SUBCUTANEOUS
  Administered 2015-10-08: 7 [IU] via SUBCUTANEOUS
  Administered 2015-10-08: 11 [IU] via SUBCUTANEOUS
  Administered 2015-10-09: 7 [IU] via SUBCUTANEOUS
  Administered 2015-10-09 (×2): 11 [IU] via SUBCUTANEOUS
  Administered 2015-10-10: 7 [IU] via SUBCUTANEOUS
  Administered 2015-10-10: 11 [IU] via SUBCUTANEOUS
  Administered 2015-10-10: 15 [IU] via SUBCUTANEOUS
  Administered 2015-10-11: 11 [IU] via SUBCUTANEOUS
  Filled 2015-10-01: qty 11
  Filled 2015-10-01: qty 3
  Filled 2015-10-01: qty 7
  Filled 2015-10-01 (×4): qty 11
  Filled 2015-10-01: qty 7
  Filled 2015-10-01: qty 11
  Filled 2015-10-01: qty 20
  Filled 2015-10-01: qty 7
  Filled 2015-10-01: qty 3
  Filled 2015-10-01: qty 7
  Filled 2015-10-01: qty 11
  Filled 2015-10-01 (×2): qty 15
  Filled 2015-10-01: qty 20
  Filled 2015-10-01 (×2): qty 7
  Filled 2015-10-01: qty 11
  Filled 2015-10-01: qty 4

## 2015-10-01 MED ORDER — LOSARTAN POTASSIUM 50 MG PO TABS
100.0000 mg | ORAL_TABLET | Freq: Every day | ORAL | Status: DC
  Administered 2015-10-02: 100 mg via ORAL
  Filled 2015-10-01 (×2): qty 2

## 2015-10-01 MED ORDER — INSULIN ASPART 100 UNIT/ML ~~LOC~~ SOLN
0.0000 [IU] | Freq: Every day | SUBCUTANEOUS | Status: DC
  Administered 2015-10-02 – 2015-10-05 (×4): 3 [IU] via SUBCUTANEOUS
  Administered 2015-10-06 – 2015-10-10 (×5): 4 [IU] via SUBCUTANEOUS
  Filled 2015-10-01 (×4): qty 4
  Filled 2015-10-01 (×4): qty 3
  Filled 2015-10-01 (×2): qty 4

## 2015-10-01 NOTE — H&P (Addendum)
[facility] [place] NAME: Amy [name]    MR#:  [id number]  DATE OF BIRTH:  1927-05-26  DATE OF ADMISSION:  10/01/2015  PRIMARY CARE PHYSICIAN: Leonel Ramsay, MD   REQUESTING/REFERRING PHYSICIAN:  Dr Corky Downs  CHIEF COMPLAINT:    SOB HISTORY OF PRESENT ILLNESS:  Amy [name]  is a 82 y.o. female with a known history of diastolic heart failure and pEF by ECHO 02/2015 Who presents with dyspnea on exertion, lower extremity edema and shortness of breath over the past 3 days. Patient reports compliance with her medications and diet. Eyes chest pain. She denies fever, chills. She has had a cough for the past 3 days. She also reports PND and orthopnea.  PAST MEDICAL HISTORY:   Past Medical History:  Diagnosis Date  . Bladder incontinence   . CAD (coronary artery disease)    a. cardiac cath 05/2009: proximal LAD 90% stenosis s/p PCI/Xience 2.75 x 12 mm DES, mid LAD 40%, diagonal 40%, proximal LCx 40% followed by 40% LCx lesion, 30%-40%-30% lesion noted in the RCA with distal RCA 30% and 25% lesions  . Chronic diastolic (congestive) heart failure ([place])    a. Lexiscan 2013: no ischemia, normal EF  . Degenerative arthritis of knee    bilateral knees  . Diabetes mellitus    Type II  . Hiatal hernia   . Hypertension   . Iron deficiency   . Menopausal symptoms   . Morbid obesity ([place])   . Thyroid disease    hypothyroidism    PAST SURGICAL HISTORY:   Past Surgical History:  Procedure Laterality Date  . COLONOSCOPY  2015  . REPLACEMENT TOTAL KNEE BILATERAL    . TOTAL VAGINAL HYSTERECTOMY     ovarian mass, not cancerous  . UPPER GI ENDOSCOPY  2015    SOCIAL HISTORY:   Social History  Substance Use Topics  . Smoking status: Never Smoker  . Smokeless tobacco: Never Used  . Alcohol use No    FAMILY HISTORY:   Family History  Problem Relation Age of Onset  . Breast cancer Mother     DRUG ALLERGIES:   Allergies   Allergen Reactions  . Atenolol     Other reaction(s): Other (See Comments) Decreased heart rate  . Codeine     Rash, difficulty breathing, nausea.    REVIEW OF SYSTEMS:   Review of Systems  Constitutional: Negative for chills, fever and malaise/fatigue.       Weight gain  HENT: Negative.  Negative for ear discharge, ear pain, hearing loss, nosebleeds and sore throat.   Eyes: Negative.  Negative for blurred vision and pain.  Respiratory: Positive for cough and shortness of breath. Negative for hemoptysis and wheezing.   Cardiovascular: Positive for orthopnea, leg swelling and PND. Negative for chest pain and palpitations.  Gastrointestinal: Negative.  Negative for abdominal pain, blood in stool, diarrhea, nausea and vomiting.  Genitourinary: Negative.  Negative for dysuria.  Musculoskeletal: Negative.  Negative for back pain.  Skin: Negative.   Neurological: Positive for weakness. Negative for dizziness, tremors, speech change, focal weakness, seizures and headaches.  Endo/Heme/Allergies: Negative.  Does not bruise/bleed easily.  Psychiatric/Behavioral: Negative.  Negative for depression, hallucinations and suicidal ideas.    MEDICATIONS AT HOME:   Prior to Admission medications   Medication Sig Start Date End Date Taking? Authorizing Provider  amLODipine (NORVASC) 5 MG tablet TAKE ONE (1) TABLET BY MOUTH EVERY DAY FOR BLOOD PRESSURE 10/19/14  Yes  Minna Merritts, MD  aspirin 81 MG tablet Take 81 mg by mouth daily.   Yes Historical Provider, MD  cloNIDine (CATAPRES - DOSED IN MG/24 HR) 0.3 mg/24hr patch Place 0.3 mg onto the skin once a week.  12/11/14  Yes Historical Provider, MD  clopidogrel (PLAVIX) 75 MG tablet Take 1 tablet (75 mg total) by mouth daily. 07/20/13  Yes Wellington Hampshire, MD  cyanocobalamin (,VITAMIN B-12,) 1000 MCG/ML injection  04/12/15  Yes Historical Provider, MD  doxazosin (CARDURA) 8 MG tablet Take 1 tablet (8 mg total) by mouth daily. 09/11/15  Yes Minna Merritts, MD  fluticasone (FLONASE) 50 MCG/ACT nasal spray Place into the nose. 04/10/15 04/09/16 Yes Historical Provider, MD  furosemide (LASIX) 20 MG tablet Take 1 tablet (20 mg total) by mouth 2 (two) times daily as needed. 12/05/14 04/01/16 Yes Minna Merritts, MD  glimepiride (AMARYL) 4 MG tablet Take 4 mg by mouth daily before breakfast.   Yes Historical Provider, MD  levothyroxine (SYNTHROID, LEVOTHROID) 125 MCG tablet Take 125 mcg by mouth daily.   Yes Historical Provider, MD  losartan-hydrochlorothiazide (HYZAAR) 100-25 MG per tablet Take 1 tablet by mouth daily.     Yes Historical Provider, MD  metFORMIN (GLUCOPHAGE) 1000 MG tablet Take 1,000 mg by mouth 2 (two) times daily with a meal.   Yes Historical Provider, MD  Multiple Vitamins-Minerals (PRESERVISION AREDS PO) Take by mouth 2 (two) times daily.   Yes Historical Provider, MD  oxybutynin (DITROPAN) 5 MG tablet Take 5 mg by mouth 1 dose over 46 hours.     Yes Historical Provider, MD  pantoprazole (PROTONIX) 40 MG tablet Take 40 mg by mouth daily.  08/22/14  Yes Historical Provider, MD  pioglitazone (ACTOS) 15 MG tablet Take 15 mg by mouth daily.   Yes Historical Provider, MD  simvastatin (ZOCOR) 40 MG tablet Take 1 tablet (40 mg total) by mouth at bedtime. Patient taking differently: Take 40 mg by mouth daily.  07/24/15  Yes Minna Merritts, MD  SYMBICORT 160-4.5 MCG/ACT inhaler Inhale 2 puffs into the lungs 2 (two) times daily.  11/15/14  Yes Historical Provider, MD      VITAL SIGNS:  Blood pressure (!) 150/40, pulse 76, temperature 97.6 F (36.4 C), temperature source Oral, resp. rate (!) 23, height '5\' 2"'$  (1.575 m), weight 116.6 kg (257 lb), SpO2 95 %.  PHYSICAL EXAMINATION:   Physical Exam  Constitutional: She is oriented to person, place, and time. No distress.   obese  HENT:  Head: Normocephalic.  Eyes: No scleral icterus.  Neck: Normal range of motion. Neck supple. No JVD present. No tracheal deviation present.   Cardiovascular: Normal rate and regular rhythm.  Exam reveals no gallop and no friction rub.   Murmur heard. Pulmonary/Chest: Effort normal and breath sounds normal. No respiratory distress. She has no wheezes. She has no rales. She exhibits no tenderness.  Decreased throughout  Abdominal: Soft. Bowel sounds are normal. She exhibits distension. She exhibits no mass. There is no tenderness. There is no rebound and no guarding.  Musculoskeletal: Normal range of motion. She exhibits edema.  Neurological: She is alert and oriented to person, place, and time.  Skin: Skin is warm. No rash noted. No erythema.  Psychiatric: Affect and judgment normal.      LABORATORY PANEL:   CBC  Recent Labs Lab 10/01/15 0847  WBC 9.0  HGB 10.4*  HCT 30.8*  PLT 195   ------------------------------------------------------------------------------------------------------------------  Chemistries   Recent  Labs Lab 10/01/15 0847  NA 139  K 4.6  CL 107  CO2 25  GLUCOSE 201*  BUN 30*  CREATININE 1.27*  CALCIUM 8.8*  AST 18  ALT 14  ALKPHOS 81  BILITOT 0.3   ------------------------------------------------------------------------------------------------------------------  Cardiac Enzymes  Recent Labs Lab 10/01/15 0847  TROPONINI <0.03   ------------------------------------------------------------------------------------------------------------------  RADIOLOGY:  Dg Chest Portable 1 View  Result Date: 10/01/2015 CLINICAL DATA:  82 year old female with lower extremity swelling for several days, shortness of breath and chest pain. Initial encounter. EXAM: PORTABLE CHEST 1 VIEW COMPARISON:  12/28/2014 and earlier. FINDINGS: Portable AP upright view at 0850 hours. Chronic hiatal hernia. Stable cardiac size and mediastinal contours. Calcified aortic atherosclerosis. Mild acute increased interstitial opacity diffusely. No pneumothorax, pleural effusion or consolidation. IMPRESSION: 1. Mild  pulmonary interstitial edema suspected. No other acute cardiopulmonary abnormality. 2.  Calcified aortic atherosclerosis. Electronically Signed   By: Genevie Ann M.D.   On: 10/01/2015 09:05    EKG:  Normal sinus rhythm heart rate 81 right bundle branch block  IMPRESSION AND PLAN:    82 year old female with diastolic heart failure and preserved ejection fraction, hypothyroidism and diabetes who presents with acute on chronic hypoxic respiratory failure.  1. Acutehypoxic respiratory failure in the setting of acute on chronic diastolic heart failure: Despite normal BNP patient's symptoms are consistent with congestive heart failure. Continue IV Lasix 40 mg daily. Monitor I/o and daily weight. Cardiology consult for further evaluation and management. Wean oxygen as tolerated. Check TSH and lower extremity Doppler. Continue ARB and consider adding beta blocker after discharge heart rate tolerated.  2. Essential hypertension: Blood pressure is elevated this morning due to the fact patient did not receive her blood pressure occasions. Continue Norvasc, clonidine, Hyzaar, Cardura.  3. Diabetes: Continue oral medications with exception of metformin. Continue sliding scale insulin and ADA diet. Diabetes educator consult.  4. Hypothyroid: Check TSH and for now continue Synthroid.  5. GERD: Continue PPI.  6. CAD: Continue Zocor, aspirin, Plavix and Hyzaar.  All the records are reviewed and case discussed with ED provider. Management plans discussed with the patient and she in agreement  CODE STATUS: full  TOTAL TIME TAKING CARE OF THIS PATIENT: 55 minutes.    Jestin Burbach M.D on 10/01/2015 at 11:08 AM  Between 7am to 6pm - Pager - [phone]  After 6pm go to [email or web address] - password [email or web address] [place] [facility]  Office  [phone]  CC: Primary care physician; Leonel Ramsay, MD

## 2015-10-01 NOTE — ED Provider Notes (Signed)
[facility] Emergency Department Provider Note   ____________________________________________    I have reviewed the triage vital signs and the nursing notes.   HISTORY  Chief Complaint Shortness of breath    HPI Amy [name] is a 82 y.o. female who presents with short of breath and chest pressure. Patient reports she started feeling more and more short of breath starting yesterday. She describes a tightness in her chest as well. She reports compliance with her medications. She does not smoke but lives with a smoker for 30 years. She sees Dr. Rockey Situ for cardiology who manages her coronary artery disease and diastolic heart failure. No fevers or chills. No cough.   Past Medical History:  Diagnosis Date  . Bladder incontinence   . CAD (coronary artery disease)    a. cardiac cath 05/2009: proximal LAD 90% stenosis s/p PCI/Xience 2.75 x 12 mm DES, mid LAD 40%, diagonal 40%, proximal LCx 40% followed by 40% LCx lesion, 30%-40%-30% lesion noted in the RCA with distal RCA 30% and 25% lesions  . Chronic diastolic (congestive) heart failure ([place])    a. Lexiscan 2013: no ischemia, normal EF  . Degenerative arthritis of knee    bilateral knees  . Diabetes mellitus    Type II  . Hiatal hernia   . Hypertension   . Iron deficiency   . Menopausal symptoms   . Morbid obesity ([place])   . Thyroid disease    hypothyroidism    Patient Active Problem List   Diagnosis Date Noted  . Advanced directives, counseling/discussion 06/05/2015  . Morbid obesity ([place]) 12/05/2014  . Angina pectoris associated with type 2 diabetes mellitus ([place]) 12/05/2014  . Diabetes type 2, uncontrolled ([place]) 12/05/2014  . Chronic diastolic CHF (congestive heart failure) ([place]) 09/06/2013  . Preop cardiovascular exam 06/03/2012  . Iron deficiency anemia 09/08/2011  . DYSPNEA 01/21/2010  . Hyperlipidemia 07/18/2009  . CAD, NATIVE VESSEL 07/18/2009  . HYPOTHYROIDISM-IATROGENIC  07/12/2009  . HYPERTENSION, BENIGN 07/12/2009    Past Surgical History:  Procedure Laterality Date  . COLONOSCOPY  2015  . REPLACEMENT TOTAL KNEE BILATERAL    . TOTAL VAGINAL HYSTERECTOMY     ovarian mass, not cancerous  . UPPER GI ENDOSCOPY  2015    Prior to Admission medications   Medication Sig Start Date End Date Taking? Authorizing Provider  amLODipine (NORVASC) 5 MG tablet TAKE ONE (1) TABLET BY MOUTH EVERY DAY FOR BLOOD PRESSURE 10/19/14  Yes Minna Merritts, MD  aspirin 81 MG tablet Take 81 mg by mouth daily.   Yes Historical Provider, MD  cloNIDine (CATAPRES - DOSED IN MG/24 HR) 0.3 mg/24hr patch Place 0.3 mg onto the skin once a week.  12/11/14  Yes Historical Provider, MD  clopidogrel (PLAVIX) 75 MG tablet Take 1 tablet (75 mg total) by mouth daily. 07/20/13  Yes Wellington Hampshire, MD  cyanocobalamin (,VITAMIN B-12,) 1000 MCG/ML injection  04/12/15  Yes Historical Provider, MD  doxazosin (CARDURA) 8 MG tablet Take 1 tablet (8 mg total) by mouth daily. 09/11/15  Yes Minna Merritts, MD  fluticasone (FLONASE) 50 MCG/ACT nasal spray Place into the nose. 04/10/15 04/09/16 Yes Historical Provider, MD  furosemide (LASIX) 20 MG tablet Take 1 tablet (20 mg total) by mouth 2 (two) times daily as needed. 12/05/14 04/01/16 Yes Minna Merritts, MD  glimepiride (AMARYL) 4 MG tablet Take 4 mg by mouth daily before breakfast.   Yes Historical Provider, MD  levothyroxine (SYNTHROID, LEVOTHROID) 125 MCG  tablet Take 125 mcg by mouth daily.   Yes Historical Provider, MD  losartan-hydrochlorothiazide (HYZAAR) 100-25 MG per tablet Take 1 tablet by mouth daily.     Yes Historical Provider, MD  metFORMIN (GLUCOPHAGE) 1000 MG tablet Take 1,000 mg by mouth 2 (two) times daily with a meal.   Yes Historical Provider, MD  Multiple Vitamins-Minerals (PRESERVISION AREDS PO) Take by mouth 2 (two) times daily.   Yes Historical Provider, MD  oxybutynin (DITROPAN) 5 MG tablet Take 5 mg by mouth 1 dose over 46 hours.      Yes Historical Provider, MD  pantoprazole (PROTONIX) 40 MG tablet Take 40 mg by mouth daily.  08/22/14  Yes Historical Provider, MD  pioglitazone (ACTOS) 15 MG tablet Take 15 mg by mouth daily.   Yes Historical Provider, MD  simvastatin (ZOCOR) 40 MG tablet Take 1 tablet (40 mg total) by mouth at bedtime. Patient taking differently: Take 40 mg by mouth daily.  07/24/15  Yes Minna Merritts, MD  SYMBICORT 160-4.5 MCG/ACT inhaler Inhale 2 puffs into the lungs 2 (two) times daily.  11/15/14  Yes Historical Provider, MD     Allergies Atenolol and Codeine  Family History  Problem Relation Age of Onset  . Breast cancer Mother     Social History Social History  Substance Use Topics  . Smoking status: Never Smoker  . Smokeless tobacco: Never Used  . Alcohol use No    Review of Systems  Constitutional: No fever/chills   Cardiovascular: As above Respiratory: Significant shortness of breath Gastrointestinal: No abdominal pain.    Musculoskeletal: Negative for back pain.  Neurological: Negative for weakness  10-point ROS otherwise negative.  ____________________________________________   PHYSICAL EXAM:  VITAL SIGNS: ED Triage Vitals [10/01/15 0837]  Enc Vitals Group     BP      Pulse      Resp      Temp      Temp src      SpO2      Weight      Height      Head Circumference      Peak Flow      Pain Score 3     Pain Loc      Pain Edu?      Excl. in [place]?     Constitutional: Alert and oriented. Ill-appearing, uncomfortable and anxious Eyes: Conjunctivae are normal.   Nose: No congestion/rhinnorhea. Mouth/Throat: Mucous membranes are moist.    Cardiovascular: Normal rate, regular rhythm. Grossly normal heart sounds.  Respiratory: Increased respiratory effort, tachypnea. Bibasilar rales Gastrointestinal: Soft and nontender. No distention.  No CVA tenderness. Genitourinary: deferred Musculoskeletal: 1+ lower extremity edema bilaterally. Warm and well  perfused Neurologic:  Normal speech and language. No gross focal neurologic deficits are appreciated.  Skin:  Skin is warm, dry and intact. No rash noted. Psychiatric: Mood and affect are normal. Speech and behavior are normal.  ____________________________________________   LABS (all labs ordered are listed, but only abnormal results are displayed)  Labs Reviewed  CBC - Abnormal; Notable for the following:       Result Value   RBC 3.31 (*)    Hemoglobin 10.4 (*)    HCT 30.8 (*)    RDW 18.3 (*)    All other components within normal limits  COMPREHENSIVE METABOLIC PANEL - Abnormal; Notable for the following:    Glucose, Bld 201 (*)    BUN 30 (*)    Creatinine, Ser 1.27 (*)  Calcium 8.8 (*)    Total Protein 6.4 (*)    GFR calc non Af Amer 37 (*)    GFR calc Af Amer 43 (*)    All other components within normal limits  TROPONIN I  BRAIN NATRIURETIC PEPTIDE  URINALYSIS COMPLETEWITH MICROSCOPIC ([facility] ONLY)   ____________________________________________  EKG  ED ECG REPORT I, Lavonia Drafts, the attending physician, personally viewed and interpreted this ECG.  Date: 10/01/2015 EKG Time: 8:45 AM Rate: 81 Rhythm: normal sinus rhythm QRS Axis: normal Intervals: normal ST/T Wave abnormalities: normal Conduction Disturbances: Right bundle branch block   ____________________________________________  RADIOLOGY  Chest x-ray consistent with interstitial edema ____________________________________________   PROCEDURES  Procedure(s) performed: No    Critical Care performed: No ____________________________________________   INITIAL IMPRESSION / ASSESSMENT AND PLAN / ED COURSE  Pertinent labs & imaging results that were available during my care of the patient were reviewed by me and considered in my medical decision making (see chart for details).  Patient presents with shortness of breath and mild chest pressure. She has a history of diastolic heart failure and  also a significant history of CHF. Suspect CHF exacerbation. Pending labs, chest x-ray, oxygen started in the ED  Clinical Course  Chest x-ray consistent with interstitial edema, we will admit to the hospitalist service, IV Lasix ordered ____________________________________________   FINAL CLINICAL IMPRESSION(S) / ED DIAGNOSES  Final diagnoses:  Acute on chronic diastolic congestive heart failure ([place])      NEW MEDICATIONS STARTED DURING THIS VISIT:  New Prescriptions   No medications on file     Note:  This document was prepared using Dragon voice recognition software and may include unintentional dictation errors.    Lavonia Drafts, MD 10/01/15 [phone]

## 2015-10-01 NOTE — Progress Notes (Signed)
CT results showed lung nodules and was negative for PE Dr. Benjie Karvonen notified.

## 2015-10-01 NOTE — Telephone Encounter (Signed)
Pt presented to the office at 7:50am today c/o increased SOB and chest tightness since last evening. She called [place] PCP and s/w Dr. Ouida Sills who was on-call. He advised pt to come to our office this morning or go to [facility] to be evaluated. Pt accompanied by a friend to our office. Pt is in a wheelchair as it is difficult for her to walk. States she feels chest tightness and has been experiencing a dry cough, wheezing and slight increased bilateral ankle edema. She slept sitting up last night which helped her to breathe better. She takes lasix PRN with last dose last evening.  States she feels "there is something in my chest" and "I just want a pill to make it better". She denies any other sx.  As there is no provider in the office at this time and patient does not appear in distress and is able to carry on a conversation w/o being winded, I advised her to proceed to [facility] as suggested by Dr. Ouida Sills or to the ER for further evaluation. Pt states she will not go to the ER but will go to Urgent Care. I accompanied pt and pt friend to the [place] entrance where they were parked. Friend will drive pt to [facility] for evaluation. Pt agreeable w/plan w/no further questions at this time.

## 2015-10-01 NOTE — Progress Notes (Signed)
PT Cancellation Note  Patient Details Name: Amy [name] MRN: [id number] DOB: 05-23-1927   Cancelled Treatment:    Reason Eval/Treat Not Completed: Patient at procedure or test/unavailable. Pt's chart reviewed. PT called RN to discuss pt and was informed by RN that pt is currently off of the floor for an ultrasound. PT will f/u and complete evaluation when pt is available and appropriate.    Neoma Laming, PT, DPT  10/01/15, 3:00 PM [phone]

## 2015-10-01 NOTE — Progress Notes (Signed)
  Inpatient Diabetes Program Recommendations  AACE/ADA: New Consensus Statement on Inpatient Glycemic Control (2015)  Target Ranges:  Prepandial:   less than 140 mg/dL      Peak postprandial:   less than 180 mg/dL (1-2 hours)      Critically ill patients:  140 - 180 mg/dL     Results for RENETTA, SUMAN (MRN [id number]) as of 10/01/2015 13:36  Ref. Range 10/01/2015 12:47  Glucose-Capillary Latest Ref Range: 65 - 99 mg/dL 105 (H)    Admit with: SOB  History: DM, CHF  Home DM Meds: Actos 15 mg daily       Metformin 1000 mg bid       Amaryl 4 mg daily  Current Insulin Orders: Actos 15 mg daily      Amaryl 4 mg daily      Novolog Resistant Correction Scale/ SSI (0-20 units) TID AC + HS       -Note patient saw her PCP on 07/17/15 (Dr. Ola Spurr with [facility]).  No changes made to oral DM medication regimen at that visit.  Patient to follow-up with PCP in 3 months (sometime August or September).  -Current A1c results pending.    MD- Note Novolog Resistant Correction Scale/ SSI (0-20 units) TID AC + HS started today.  Patient also getting home doses of Actos and Amaryl.  If patient has any issues with poor PO intake, may want to stop Amaryl.  Please stop Actos for now, as patient having issues with fluid and side effect of Actos is fluid retention.     --Will follow patient during hospitalization--  Wyn Quaker RN, MSN, CDE Diabetes Coordinator Inpatient Glycemic Control Team Team Pager: [phone] (8a-5p)

## 2015-10-01 NOTE — Consult Note (Signed)
CARDIOLOGY CONSULT NOTE  Patient ID: Amy [name] MRN: [id number] DOB/AGE: 1927-07-03 82 y.o.  Admit date: 10/01/2015 Referring Physician:  : Dr. Benjie Karvonen Primary Physician: Leonel Ramsay, MD Primary Cardiologist : Dr. Rockey Situ Reason for Consultation : CHF  Date:  10/01/2015    Chief Complaint  Patient presents with  . Angioedema  . Chest Pain  . Shortness of Breath      History of Present Illness: Amy [name] is a 82 y.o. female who presented with worsening dyspnea. She has known history of coronary artery disease status post LAD PCI in 2011, essential hypertension and possible chronic diastolic heart failure. She was seen last month by Dr.Gollan  and reported worsening dyspnea. She usually takes furosemide twice weekly. Blood pressure was elevated and thus Cardura was added. Previous echocardiogram in January showed normal LV systolic function with mild aortic stenosis.   since then, the patient had worsening of dyspnea, orthopnea and weight gain. She is at least 6 pounds above her dry weight. She's been having hard time sleeping at night and has to sleep in a recliner. She denies excessive sodium intake. When she is having hard time breathing like this, she has some sternal chest discomfort. She also noted wheezing and mostly dry cough.     Past Medical History:  Diagnosis Date  . Bladder incontinence   . CAD (coronary artery disease)    a. cardiac cath 05/2009: proximal LAD 90% stenosis s/p PCI/Xience 2.75 x 12 mm DES, mid LAD 40%, diagonal 40%, proximal LCx 40% followed by 40% LCx lesion, 30%-40%-30% lesion noted in the RCA with distal RCA 30% and 25% lesions  . Chronic diastolic (congestive) heart failure ([place])    a. Lexiscan 2013: no ischemia, normal EF  . Degenerative arthritis of knee    bilateral knees  . Diabetes mellitus    Type II  . Hiatal hernia   . Hypertension   . Iron deficiency   . Menopausal symptoms   . Morbid obesity ([place])   . Thyroid  disease    hypothyroidism    Past Surgical History:  Procedure Laterality Date  . COLONOSCOPY  2015  . REPLACEMENT TOTAL KNEE BILATERAL    . TOTAL VAGINAL HYSTERECTOMY     ovarian mass, not cancerous  . UPPER GI ENDOSCOPY  2015     Current Facility-Administered Medications  Medication Dose Route Frequency Provider Last Rate Last Dose  . 0.9 %  sodium chloride infusion  250 mL Intravenous PRN Bettey Costa, MD      . acetaminophen (TYLENOL) tablet 650 mg  650 mg Oral Q6H PRN Bettey Costa, MD       Or  . acetaminophen (TYLENOL) suppository 650 mg  650 mg Rectal Q6H PRN Bettey Costa, MD      . amLODipine (NORVASC) tablet 10 mg  10 mg Oral Daily Bettey Costa, MD      . aspirin chewable tablet 81 mg  81 mg Oral Daily Sital Mody, MD      . cloNIDine (CATAPRES - Dosed in mg/24 hr) patch 0.3 mg  0.3 mg Transdermal Weekly Sital Mody, MD      . clopidogrel (PLAVIX) tablet 75 mg  75 mg Oral Daily Sital Mody, MD      . doxazosin (CARDURA) tablet 8 mg  8 mg Oral Daily Sital Mody, MD      . enoxaparin (LOVENOX) injection 40 mg  40 mg Subcutaneous BID Bettey Costa, MD   40 mg at 10/01/15 1200  .  furosemide (LASIX) injection 40 mg  40 mg Intravenous Q8H Sital Mody, MD   40 mg at 10/01/15 1629  . [START ON 10/02/2015] glimepiride (AMARYL) tablet 4 mg  4 mg Oral QAC breakfast Sital Mody, MD      . losartan (COZAAR) tablet 100 mg  100 mg Oral Daily Sital Mody, MD       And  . hydrochlorothiazide (HYDRODIURIL) tablet 25 mg  25 mg Oral Daily Sital Mody, MD      . insulin aspart (novoLOG) injection 0-20 Units  0-20 Units Subcutaneous TID WC Bettey Costa, MD   4 Units at 10/01/15 1654  . insulin aspart (novoLOG) injection 0-5 Units  0-5 Units Subcutaneous QHS Sital Mody, MD      . ipratropium-albuterol (DUONEB) 0.5-2.5 (3) MG/3ML nebulizer solution 3 mL  3 mL Nebulization Q4H PRN Sital Mody, MD      . levothyroxine (SYNTHROID, LEVOTHROID) tablet 125 mcg  125 mcg Oral Daily Sital Mody, MD      . mometasone-formoterol  (DULERA) 200-5 MCG/ACT inhaler 2 puff  2 puff Inhalation BID Sital Mody, MD      . ondansetron (ZOFRAN) tablet 4 mg  4 mg Oral Q6H PRN Bettey Costa, MD       Or  . ondansetron (ZOFRAN) injection 4 mg  4 mg Intravenous Q6H PRN Sital Mody, MD      . oxybutynin (DITROPAN) tablet 5 mg  5 mg Oral Daily Sital Mody, MD      . pantoprazole (PROTONIX) EC tablet 40 mg  40 mg Oral Daily Sital Mody, MD      . senna-docusate (Senokot-S) tablet 1 tablet  1 tablet Oral QHS PRN Bettey Costa, MD      . simvastatin (ZOCOR) tablet 40 mg  40 mg Oral Daily Sital Mody, MD      . sodium chloride flush (NS) 0.9 % injection 3 mL  3 mL Intravenous Q12H Sital Mody, MD      . sodium chloride flush (NS) 0.9 % injection 3 mL  3 mL Intravenous Q12H Sital Mody, MD   3 mL at 10/01/15 1145  . sodium chloride flush (NS) 0.9 % injection 3 mL  3 mL Intravenous PRN Bettey Costa, MD      . traMADol (ULTRAM) tablet 50 mg  50 mg Oral Q6H PRN Bettey Costa, MD        Allergies:   Atenolol and Codeine    Social History:  The patient  reports that she has never smoked. She has never used smokeless tobacco. She reports that she does not drink alcohol or use drugs.   Family History:  The patient's family history includes Breast cancer in her mother.    ROS:  Please see the history of present illness.   Otherwise, review of systems are positive for none.   All other systems are reviewed and negative.    PHYSICAL EXAM: VS:  BP (!) 141/43 (BP Location: Right Arm)   Pulse 77   Temp 97.8 F (36.6 C) (Oral)   Resp 18   Ht '5\' 2"'$  (1.575 m)   Wt 257 lb (116.6 kg)   SpO2 93%   BMI 47.01 kg/m  , BMI Body mass index is 47.01 kg/m. GEN: Well nourished, well developed, in no acute distress  HEENT: normal  Neck: no JVD, carotid bruits, or masses Cardiac: RRR; no murmurs, rubs, or gallops,no edema  Respiratory:  bilaterally expiratory wheezing. Mild tachypnea. GI: soft, nontender, nondistended, + BS MS: no  deformity or atrophy  Skin: warm and  dry, no rash Neuro:  Strength and sensation are intact Psych: euthymic mood, full affect   EKG:   Personally reviewed by me and showed:  Sinus rhythm with right bundle branch block Telemetry recently reviewed by me and showed:  sinus rhythm   Recent Labs: 10/01/2015: ALT 14; B Natriuretic Peptide 53.0; BUN 30; Creatinine, Ser 1.27; Hemoglobin 10.4; Platelets 195; Potassium 4.6; Sodium 139; TSH 5.103    Lipid Panel    Component Value Date/Time   CHOL 135 06/25/2010 0814   TRIG 220 (H) 06/25/2010 0814   TRIG 194 05/07/2009   HDL 35 (L) 06/25/2010 0814   CHOLHDL 3.9 06/25/2010 0814   VLDL 44 (H) 06/25/2010 0814   LDLCALC 56 06/25/2010 0814      Wt Readings from Last 3 Encounters:  10/01/15 257 lb (116.6 kg)  09/11/15 251 lb (113.9 kg)  06/05/15 244 lb 12 oz (111 kg)      Other studies Reviewed: Additional studies/ records that were reviewed today includeChest x-ray. BNP was normal Review of the above records demonstrates:  Pulmonary vascular congestion and cardiomegaly.    ASSESSMENT AND PLAN:  1.  Acute on chronic diastolic heart failure: In spite of the patient's normal BNP, her clinical presentation and chest x-rays consistent with heart failure. She appears to be volume overloaded and I agree with intravenous furosemide. Continue with low sodium diet and monitor intake and output. The patient will require careful blood pressure control. She does have significant wheezing and also has been having recent cough. She might have a component of bronchitis in addition to heart failure. If symptoms do not improve with diuresis, recommend treating for possible bronchitis with reactive airway disease.  2. Coronary artery disease status post LAD stent placement: Her symptoms seems to be due to heart failure more than angina. Continue medical therapy.  3. Essential hypertension: Blood pressure is reasonably controlled. Beta blockers in the past caused bradycardia. Continue current  medications for now.    Signed,  Kathlyn Sacramento, MD  10/01/2015 5:28 PM    [place]

## 2015-10-01 NOTE — ED Triage Notes (Signed)
Pt to ed with c/o swelling in feet and ankles for several days.  Pt reports sob, chest pain.

## 2015-10-01 NOTE — Progress Notes (Addendum)
Patient developed SOB after transferring from chair to the bed without rebound. Lung sound [place] throughout. Dr. Benjie Karvonen notified orders to give duonebs now and q4, Furosemide q8, chest CT to rule out PE given through verbal orders. Dr. Benjie Karvonen stated to give patient Furosemide when she gets back from her Chest CT. Duonebs given patient verbalizes she feels better. Will continue to monitor.

## 2015-10-02 ENCOUNTER — Inpatient Hospital Stay: Payer: Medicare Other

## 2015-10-02 ENCOUNTER — Inpatient Hospital Stay (HOSPITAL_COMMUNITY)
Admit: 2015-10-02 | Discharge: 2015-10-02 | Disposition: A | Discharge status: Home / Self Care | Payer: Medicare Other | Attending: Internal Medicine | Admitting: Internal Medicine

## 2015-10-02 DIAGNOSIS — I25810 Atherosclerosis of coronary artery bypass graft(s) without angina pectoris: Secondary | ICD-10-CM

## 2015-10-02 DIAGNOSIS — I10 Essential (primary) hypertension: Secondary | ICD-10-CM

## 2015-10-02 DIAGNOSIS — R079 Chest pain, unspecified: Secondary | ICD-10-CM

## 2015-10-02 LAB — BASIC METABOLIC PANEL
Anion gap: 10 (ref 5–15)
BUN: 33 mg/dL — AB (ref 6–20)
CALCIUM: 8.9 mg/dL (ref 8.9–10.3)
CO2: 28 mmol/L (ref 22–32)
CREATININE: 1.29 mg/dL — AB (ref 0.44–1.00)
Chloride: 103 mmol/L (ref 101–111)
GFR calc Af Amer: 42 mL/min — ABNORMAL LOW (ref >60)
GFR, EST NON AFRICAN AMERICAN: 36 mL/min — AB (ref >60)
GLUCOSE: 126 mg/dL — AB (ref 65–99)
POTASSIUM: 4.2 mmol/L (ref 3.5–5.1)
SODIUM: 141 mmol/L (ref 135–145)

## 2015-10-02 LAB — GLUCOSE, CAPILLARY
GLUCOSE-CAPILLARY: 136 mg/dL — AB (ref 65–99)
GLUCOSE-CAPILLARY: 131 mg/dL — AB (ref 65–99)
GLUCOSE-CAPILLARY: 294 mg/dL — AB (ref 65–99)

## 2015-10-02 LAB — ECHOCARDIOGRAM COMPLETE
HEIGHTINCHES: 62 in
WEIGHTICAEL: 4004.8 [oz_av]

## 2015-10-02 LAB — TROPONIN I

## 2015-10-02 MED ORDER — FUROSEMIDE 10 MG/ML IJ SOLN: 40.0000 mg | Freq: Two times a day (BID) | INTRAMUSCULAR | Status: DC

## 2015-10-02 MED ORDER — LEVOFLOXACIN 250 MG PO TABS
250.0000 mg | ORAL_TABLET | Freq: Every day | ORAL | Status: DC
  Administered 2015-10-03 – 2015-10-08 (×6): 250 mg via ORAL
  Filled 2015-10-02 (×6): qty 1

## 2015-10-02 MED ORDER — BUDESONIDE 0.25 MG/2ML IN SUSP
0.2500 mg | Freq: Two times a day (BID) | RESPIRATORY_TRACT | Status: DC
  Administered 2015-10-02 – 2015-10-09 (×13): 0.25 mg via RESPIRATORY_TRACT
  Filled 2015-10-02 (×14): qty 2

## 2015-10-02 MED ORDER — HYDROCOD POLST-CPM POLST ER 10-8 MG/5ML PO SUER
5.0000 mL | Freq: Two times a day (BID) | ORAL | Status: DC
  Administered 2015-10-02 – 2015-10-11 (×19): 5 mL via ORAL
  Filled 2015-10-02 (×19): qty 5

## 2015-10-02 MED ORDER — LEVOFLOXACIN 500 MG PO TABS
500.0000 mg | ORAL_TABLET | Freq: Every day | ORAL | Status: DC
  Administered 2015-10-02: 500 mg via ORAL
  Filled 2015-10-02: qty 1

## 2015-10-02 MED ORDER — METHYLPREDNISOLONE SODIUM SUCC 125 MG IJ SOLR
60.0000 mg | INTRAMUSCULAR | Status: DC
  Administered 2015-10-02 – 2015-10-03 (×2): 60 mg via INTRAVENOUS
  Filled 2015-10-02 (×2): qty 2

## 2015-10-02 MED ORDER — IPRATROPIUM-ALBUTEROL 0.5-2.5 (3) MG/3ML IN SOLN
3.0000 mL | RESPIRATORY_TRACT | Status: DC
Start: 2015-10-02 — End: 2015-10-03
  Administered 2015-10-02 – 2015-10-03 (×7): 3 mL via RESPIRATORY_TRACT
  Filled 2015-10-02 (×6): qty 3

## 2015-10-02 NOTE — Care Management Note (Signed)
Case Management Note  Patient Details  Name: Paytyn C Cavanagh MRN: [id number] Date of Birth: 03/31/1927  Subjective/Objective:    Met with patient at bedside. She lives alone in a guest house on her daughter property. Her daughter has recently died so she has limited support. She has a son but he works and has a family. She still drives herself to appointments but it is a tasking effort.She uses a cane but is in need of a Rolator. She refused a rolling walker without a seat. Ordered from [facility].  PCP is Dr. Fitzgerald. PT recommending PT/OT, would benefit from SN. No agency preference. Referral to  [facility].                  Action/Plan: [facility] for SN, PT, OT. Ordered Rolator from [facility].   Expected Discharge Date:                  Expected Discharge Plan:  Home w Home Health Services  In-House Referral:     Discharge planning Services  CM Consult  Post Acute Care Choice:  Home Health Choice offered to:  Patient  DME Arranged:  Walker rolling with seat DME Agency:  [facility]  HH Arranged:  RN, Disease Management, PT, OT HH Agency:  [facility]  Status of Service:  In process, will continue to follow  If discussed at Long Length of Stay Meetings, dates discussed:    Additional Comments:   M , RN 10/02/2015, 2:49 PM  

## 2015-10-02 NOTE — Progress Notes (Signed)
Patient Name: Amy [name] Date of Encounter: 10/02/2015  Patient Care Team: Leonel Ramsay, MD as PCP - General (Infectious Diseases) Minna Merritts, MD as Consulting Physician (Cardiology)  PROBLEM LIST  Active Problems:   CHF (congestive heart failure) ([place])     SUBJECTIVE  -2L from yesterday. Still SOB with cough and wheeze.  CURRENT MEDS . amLODipine  10 mg Oral Daily  . aspirin  81 mg Oral Daily  . budesonide (PULMICORT) nebulizer solution  0.25 mg Nebulization BID  . chlorpheniramine-HYDROcodone  5 mL Oral Q12H  . cloNIDine  0.3 mg Transdermal Weekly  . clopidogrel  75 mg Oral Daily  . doxazosin  8 mg Oral Daily  . enoxaparin (LOVENOX) injection  40 mg Subcutaneous BID  . furosemide  40 mg Intravenous Q8H  . glimepiride  4 mg Oral QAC breakfast  . losartan  100 mg Oral Daily   And  . hydrochlorothiazide  25 mg Oral Daily  . insulin aspart  0-20 Units Subcutaneous TID WC  . insulin aspart  0-5 Units Subcutaneous QHS  . ipratropium-albuterol  3 mL Nebulization Q4H  . levofloxacin  500 mg Oral Daily  . levothyroxine  125 mcg Oral Daily  . methylPREDNISolone (SOLU-MEDROL) injection  60 mg Intravenous Q24H  . oxybutynin  5 mg Oral Daily  . pantoprazole  40 mg Oral Daily  . simvastatin  40 mg Oral Daily  . sodium chloride flush  3 mL Intravenous Q12H    OBJECTIVE  Vitals:   10/02/15 0557 10/02/15 0725 10/02/15 0733 10/02/15 1114  BP: (!) 143/38 111/63 (!) 131/56 (!) 115/47  Pulse: 73 81 72 72  Resp: '20 20  15  '$ Temp: 98 F (36.7 C)   97.6 F (36.4 C)  TempSrc: Oral   Oral  SpO2: 97% 95% 97% 97%  Weight: 250 lb 4.8 oz (113.5 kg)     Height:        Intake/Output Summary (Last 24 hours) at 10/02/15 1301 Last data filed at 10/02/15 5320  Gross per 24 hour  Intake              723 ml  Output             2205 ml  Net            -1482 ml   Filed Weights   10/01/15 0844 10/02/15 0557  Weight: 257 lb (116.6 kg) 250 lb 4.8 oz (113.5 kg)     PHYSICAL EXAM VS:  BP (!) 115/47 (BP Location: Left Arm)   Pulse 72   Temp 97.6 F (36.4 C) (Oral)   Resp 15   Ht '5\' 2"'$  (1.575 m)   Wt 250 lb 4.8 oz (113.5 kg)   SpO2 97%   BMI 45.78 kg/m  , BMI Body mass index is 45.78 kg/m. GENERAL:  well developed, well nourished, morbidly obese, not in acute distress HEENT: normocephalic, pink conjunctivae, anicteric sclerae, no xanthelasma, normal dentition, oropharynx clear NECK:  (+)  neck vein engorgement, JVP elevated, (+) hepatojugular reflux, carotid upstroke brisk and symmetric, no bruit, no thyromegaly, no lymphadenopathy LUNGS:  good respiratory effort, diffuse wheezing CV:  PMI not displaced, no thrills, no lifts, S1 and S2 within normal limits, no palpable S3 or S4, no murmurs, no rubs, no gallops ABD:  Soft, nontender, nondistended, normoactive bowel sounds, no abdominal aortic bruit, no hepatomegaly, no splenomegaly MS: nontender back, no kyphosis, no scoliosis, no joint deformities EXT:  1+ DP/PT pulses, ++  edema, no varicosities, no cyanosis, no clubbing SKIN: warm, nondiaphoretic, normal turgor, no ulcers NEUROPSYCH: alert, oriented to person, place, and time, sensory/motor grossly intact, normal mood, appropriate affect   Accessory Clinical Findings  CBC  Recent Labs  10/01/15 0847  WBC 9.0  HGB 10.4*  HCT 30.8*  MCV 92.9  PLT 509   Basic Metabolic Panel  Recent Labs  10/01/15 0847 10/02/15 0737  NA 139 141  K 4.6 4.2  CL 107 103  CO2 25 28  GLUCOSE 201* 126*  BUN 30* 33*  CREATININE 1.27* 1.29*  CALCIUM 8.8* 8.9   Liver Function Tests  Recent Labs  10/01/15 0847  AST 18  ALT 14  ALKPHOS 81  BILITOT 0.3  PROT 6.4*  ALBUMIN 3.7   No results for input(s): LIPASE, AMYLASE in the last 72 hours. Cardiac Enzymes  Recent Labs  10/01/15 1224 10/01/15 1743 10/01/15 2346  TROPONINI <0.03 <0.03 <0.03   BNP (last 3 results)  Recent Labs  10/01/15 0847  BNP 53.0   D-Dimer No results for  input(s): DDIMER in the last 72 hours. Hemoglobin A1C  Recent Labs  10/01/15 1224  HGBA1C 5.6   Fasting Lipid Panel No results for input(s): CHOL, HDL, LDLCALC, TRIG, CHOLHDL, LDLDIRECT in the last 72 hours. Thyroid Function Tests  Recent Labs  10/01/15 1224  TSH 5.103*    TELE  SR    RADIOLOGY/STUDIES  Ct Angio Chest Pe W Or Wo Contrast  Result Date: 10/01/2015 CLINICAL DATA:  Chest pain and short of breath EXAM: CT ANGIOGRAPHY CHEST WITH CONTRAST TECHNIQUE: Multidetector CT imaging of the chest was performed using the standard protocol during bolus administration of intravenous contrast. Multiplanar CT image reconstructions and MIPs were obtained to evaluate the vascular anatomy. CONTRAST:  60 cc Isovue 370 COMPARISON:  None. FINDINGS: There are no filling defects in the pulmonary arterial tree to suggest acute pulmonary thromboembolism There is no evidence of aortic dissection or aneurysm. Great vessels are grossly patent. There is no abnormal mediastinal adenopathy. No pericardial effusion. Three vessel coronary artery calcification. Mitral annular calcification. Mild peripheral aortic valvular calcification. Large hiatal hernia No pneumothorax or pleural effusion. 6 mm right upper pole nodule on image 17. 6 mm left lower lobe pulmonary nodule on image 52. 7 mm left lower lobe pulmonary nodule on image 52. No acute bony deformity. Review of the MIP images confirms the above findings. IMPRESSION: No evidence of acute pulmonary thromboembolism Hiatal hernia. Multiple bilateral small pulmonary nodules. The largest in the left lower lobe measures 7 mm. Non-contrast chest CT at 3-6 months is recommended. If the nodules are stable at time of repeat CT, then future CT at 18-24 months (from today's scan) is considered optional for low-risk patients, but is recommended for high-risk patients. This recommendation follows the consensus statement: Guidelines for Management of Incidental Pulmonary  Nodules Detected on CT Images:From the Fleischner Society 2017; published online before print (10.1148/radiol.[id number]). Electronically Signed   By: Marybelle Killings M.D.   On: 10/01/2015 16:09   US Venous Img Lower Bilateral  Result Date: 10/01/2015 CLINICAL DATA:  Bilateral lower extremity edema, left greater than right. Evaluate for DVT. EXAM: BILATERAL LOWER EXTREMITY VENOUS DOPPLER ULTRASOUND TECHNIQUE: Gray-scale sonography with graded compression, as well as color Doppler and duplex ultrasound were performed to evaluate the lower extremity deep venous systems from the level of the common femoral vein and including the common femoral, femoral, profunda femoral, popliteal and calf veins including the posterior tibial, peroneal and  gastrocnemius veins when visible. The superficial great saphenous vein was also interrogated. Spectral Doppler was utilized to evaluate flow at rest and with distal augmentation maneuvers in the common femoral, femoral and popliteal veins. COMPARISON:  Left lower extremity venous Doppler ultrasound - 10/12/2013 FINDINGS: Examination is degraded due to patient body habitus and poor sonographic window. RIGHT LOWER EXTREMITY Common Femoral Vein: No evidence of thrombus. Normal compressibility, respiratory phasicity and response to augmentation. Saphenofemoral Junction: No evidence of thrombus. Normal compressibility and flow on color Doppler imaging. Profunda Femoral Vein: No evidence of thrombus. Normal compressibility and flow on color Doppler imaging. Femoral Vein: No evidence of thrombus. Normal compressibility, respiratory phasicity and response to augmentation. Popliteal Vein: No evidence of thrombus. Normal compressibility, respiratory phasicity and response to augmentation. Calf Veins: No evidence of thrombus. Normal compressibility and flow on color Doppler imaging. Superficial Great Saphenous Vein: No evidence of thrombus. Normal compressibility and flow on color Doppler  imaging. Venous Reflux:  None. Other Findings:  None. LEFT LOWER EXTREMITY Common Femoral Vein: No evidence of thrombus. Normal compressibility, respiratory phasicity and response to augmentation. Saphenofemoral Junction: No evidence of thrombus. Normal compressibility and flow on color Doppler imaging. Profunda Femoral Vein: No evidence of thrombus. Normal compressibility and flow on color Doppler imaging. Femoral Vein: No evidence of thrombus. Normal compressibility, respiratory phasicity and response to augmentation. Popliteal Vein: No evidence of thrombus. Normal compressibility, respiratory phasicity and response to augmentation. Calf Veins: No evidence of thrombus. Normal compressibility and flow on color Doppler imaging. Superficial Great Saphenous Vein: No evidence of thrombus. Normal compressibility and flow on color Doppler imaging. Venous Reflux:  None. Other Findings:  None. IMPRESSION: No evidence of DVT within either lower extremity. Electronically Signed   By: Sandi Mariscal M.D.   On: 10/01/2015 15:45   Dg Chest Portable 1 View  Result Date: 10/01/2015 CLINICAL DATA:  82 year old female with lower extremity swelling for several days, shortness of breath and chest pain. Initial encounter. EXAM: PORTABLE CHEST 1 VIEW COMPARISON:  12/28/2014 and earlier. FINDINGS: Portable AP upright view at 0850 hours. Chronic hiatal hernia. Stable cardiac size and mediastinal contours. Calcified aortic atherosclerosis. Mild acute increased interstitial opacity diffusely. No pneumothorax, pleural effusion or consolidation. IMPRESSION: 1. Mild pulmonary interstitial edema suspected. No other acute cardiopulmonary abnormality. 2.  Calcified aortic atherosclerosis. Electronically Signed   By: Genevie Ann M.D.   On: 10/01/2015 09:05   Echo 10/02/2015: Procedure narrative: Transthoracic echocardiography. The study   was technically difficult, as a result of restricted patient   mobility. - Left ventricle: The cavity size  was normal. Wall thickness was   increased in a pattern of moderate LVH. Systolic function was   normal. The estimated ejection fraction was in the range of 55%   to 60%. Doppler parameters are consistent with abnormal left   ventricular relaxation (grade 1 diastolic dysfunction). - Mitral valve: Severely calcified annulus. - Left atrium: The atrium was mildly dilated.  ASSESSMENT AND PLAN Acute on chronic diastolic heart failure Continue with IV diuresis. Monitor BMP, electrolytes, daily weights,  Is/ Os  Sodium restriction  Still with  significant wheezing - Bronchitis treatment per primary team  Coronary artery disease status post LAD stent placement: Her symptoms seems to be due to heart failure more than angina. Troponins negative   Continue medical therapy.  Essential hypertension Blood pressure is reasonably controlled. Beta blockers in the past caused bradycardia. Continue current medications for now.  Trivial PS on echo Incidental finding, not playing significant  role  Total encounter time 36mns    Signed, AWende Bushy MD  10/02/2015, 1:01 PM  [place]

## 2015-10-02 NOTE — Evaluation (Signed)
Physical Therapy Evaluation Patient Details Name: Amy [name] MRN: [id number] DOB: May 14, 1927 Today's Date: 10/02/2015   History of Present Illness  Pt is an 82 year old female with diastolic heart failure and preserved ejection fraction, hypothyroidism and diabetes who presents with acute on chronic hypoxic respiratory failure.  Clinical Impression  Pt presents with deficits in gait, mobility, and activity tolerance and will benefit from skilled PT services to address these issues to return to PLOF and for a safe return home.  Pt on 2LO2/min with SpO2 remaining >/= 95% and HR increasing from baseline of 68 bpm to 80 bpm after amb 5' with RW.  Pt has 5 steps to enter home with step assessment deferred secondary to poor activity tolerance and SOB with minimal ambulation at time of eval.    Follow Up Recommendations Home health PT;Outpatient PT, depending on progress and status at discharge.    Equipment Recommendations  Rolling walker with 5" wheels (Or rollator depending on progress)    Recommendations for Other Services       Precautions / Restrictions Precautions Precautions: Fall Precaution Comments: Pt on 2LO2/min Restrictions Weight Bearing Restrictions: No      Mobility  Bed Mobility Overal bed mobility: Needs Assistance Bed Mobility: Supine to Sit;Sit to Supine     Supine to sit: Supervision Sit to supine: Mod assist      Transfers Overall transfer level: Needs assistance Equipment used: Rolling walker (2 wheeled) Transfers: Sit to/from Stand Sit to Stand: Min guard            Ambulation/Gait Ambulation/Gait assistance: Min guard Ambulation Distance (Feet): 5 Feet Assistive device: Rolling walker (2 wheeled) Gait Pattern/deviations:  (Slow cadence) Gait velocity: <0.8 m/s Gait velocity interpretation: <1.8 ft/sec, indicative of risk for recurrent falls    Stairs Stairs:  (Deferred secondary to SOB with minimal ambulation)           Wheelchair Mobility    Modified Rankin (Stroke Patients Only)       Balance Overall balance assessment: No apparent balance deficits (not formally assessed)                                           Pertinent Vitals/Pain Pain Assessment: No/denies pain    Home Living Family/patient expects to be discharged to:: Private residence Living Arrangements: Alone   Type of Home: House Home Access: Stairs to enter Entrance Stairs-Rails: Can reach both Entrance Stairs-Number of Steps: 5 Home Layout: One level Home Equipment: Cane - single point      Prior Function Level of Independence: Independent               Hand Dominance   Dominant Hand: Right    Extremity/Trunk Assessment   Upper Extremity Assessment: Defer to OT evaluation           Lower Extremity Assessment: Generalized weakness         Communication   Communication: No difficulties  Cognition Arousal/Alertness: Awake/alert Behavior During Therapy: WFL for tasks assessed/performed Overall Cognitive Status: Within Functional Limits for tasks assessed                      General Comments      Exercises General Exercises - Lower Extremity Ankle Circles/Pumps: AROM;10 reps Quad Sets: AROM Gluteal Sets: AROM Long Arc Quad: AROM;Strengthening;10 reps;Both Heel Slides: Strengthening;10 reps;Both  Assessment/Plan    PT Assessment Patient needs continued PT services  PT Diagnosis Difficulty walking;Generalized weakness   PT Problem List Decreased strength;Decreased activity tolerance;Decreased mobility  PT Treatment Interventions     PT Goals (Current goals can be found in the Care Plan section) Acute Rehab PT Goals Patient Stated Goal: To walk longer before feeling SOB PT Goal Formulation: With patient Time For Goal Achievement: 10/15/15 Potential to Achieve Goals: Good    Frequency Min 2X/week   Barriers to discharge        Co-evaluation                End of Session Equipment Utilized During Treatment: Oxygen Activity Tolerance: Patient limited by fatigue Patient left: in chair           Time: 9323-5573 PT Time Calculation (min) (ACUTE ONLY): 32 min   Charges:   PT Evaluation $PT Eval Low Complexity: 1 Procedure $PT Exercise Group 3 units: 1 Procedure PT Treatments $Therapeutic Exercise: 8-22 mins   PT G Codes:        Cy Blamer, PT 10/02/2015, 2:09 PM

## 2015-10-02 NOTE — Progress Notes (Signed)
[facility] [place] NAME: Tanetta Fuhriman    MR#:  [id number]  DATE OF BIRTH:  04/11/1927  SUBJECTIVE:  CHIEF COMPLAINT:   Chief Complaint  Patient presents with  . Angioedema  . Chest Pain  . Shortness of Breath  The patient is 82 year old Caucasian female with past medical history significant for history of chronic diastolic CHF who presents to the hospital with complaints of shortness of breath, dyspnea on exertion, lower extremity swelling, orthopnea, PND, wheezing, cough. Patient's chest x-ray revealed mild pulmonary edema. Lower extremity Dopplers showed no DVT bilaterally. CT angiogram of the chest showed no evidence of acute thromboembolism, hiatal hernia, multiple bilateral small pulmonary nodules, repeat the CT was recommended. Patient feels poorly today, not much improvement since yesterday. With diuresis, shortness of cough, minimal sputum production, wheezing  Review of Systems  Constitutional: Negative for chills, fever and weight loss.  HENT: Negative for congestion.   Eyes: Negative for blurred vision and double vision.  Respiratory: Positive for cough, sputum production, shortness of breath and wheezing.   Cardiovascular: Positive for orthopnea, leg swelling and PND. Negative for chest pain and palpitations.  Gastrointestinal: Negative for abdominal pain, blood in stool, constipation, diarrhea, nausea and vomiting.  Genitourinary: Negative for dysuria, frequency, hematuria and urgency.  Musculoskeletal: Negative for falls.  Neurological: Positive for weakness. Negative for dizziness, tremors, focal weakness and headaches.  Endo/Heme/Allergies: Does not bruise/bleed easily.  Psychiatric/Behavioral: Negative for depression. The patient does not have insomnia.     VITAL SIGNS: Blood pressure (!) 115/47, pulse 72, temperature 97.6 F (36.4 C), temperature source Oral, resp. rate 15, height '5\' 2"'$  (1.575 m), weight 113.5  kg (250 lb 4.8 oz), SpO2 97 %.  PHYSICAL EXAMINATION:   GENERAL:  82 y.o.-year-old patient lying in the bed in mild-to-moderate respiratory distress, tachypneic, uncomfortable, coughing nonstop, no sputum production.  EYES: Pupils equal, round, reactive to light and accommodation. No scleral icterus. Extraocular muscles intact.  HEENT: Head atraumatic, normocephalic. Oropharynx and nasopharynx clear.  NECK:  Supple, no jugular venous distention. No thyroid enlargement, no tenderness.  LUNGS: Diminished breath sounds bilaterally, bilateral wheezing, scattered rales,rhonchi , few crepitations . Using accessory muscles of respiration, tachypneic and uncomfortable. Marland Kitchen  CARDIOVASCULAR: S1, S2, Tachycardic . No murmurs, rubs, or gallops.  ABDOMEN: Soft, nontender, nondistended. Bowel sounds present. No organomegaly or mass.  EXTREMITIES: 1-2+ lower extremity and pedal edema, no cyanosis, or clubbing.  NEUROLOGIC: Cranial nerves II through XII are intact. Muscle strength 5/5 in all extremities. Sensation intact. Gait not checked.  PSYCHIATRIC: The patient is alert and oriented x 3.  SKIN: No obvious rash, lesion, or ulcer.   ORDERS/RESULTS REVIEWED:   CBC  Recent Labs Lab 10/01/15 0847  WBC 9.0  HGB 10.4*  HCT 30.8*  PLT 195  MCV 92.9  MCH 31.5  MCHC 33.9  RDW 18.3*   ------------------------------------------------------------------------------------------------------------------  Chemistries   Recent Labs Lab 10/01/15 0847 10/02/15 0737  NA 139 141  K 4.6 4.2  CL 107 103  CO2 25 28  GLUCOSE 201* 126*  BUN 30* 33*  CREATININE 1.27* 1.29*  CALCIUM 8.8* 8.9  AST 18  --   ALT 14  --   ALKPHOS 81  --   BILITOT 0.3  --    ------------------------------------------------------------------------------------------------------------------ estimated creatinine clearance is 37.3 mL/min (by C-G formula based on SCr of 1.29  mg/dL). ------------------------------------------------------------------------------------------------------------------  Recent Labs  10/01/15 1224  TSH 5.103*    Cardiac Enzymes  Recent Labs Lab 10/01/15 1224 10/01/15 1743 10/01/15 2346  TROPONINI <0.03 <0.03 <0.03   ------------------------------------------------------------------------------------------------------------------ Invalid input(s): POCBNP ---------------------------------------------------------------------------------------------------------------  RADIOLOGY: Ct Angio Chest Pe W Or Wo Contrast  Result Date: 10/01/2015 CLINICAL DATA:  Chest pain and short of breath EXAM: CT ANGIOGRAPHY CHEST WITH CONTRAST TECHNIQUE: Multidetector CT imaging of the chest was performed using the standard protocol during bolus administration of intravenous contrast. Multiplanar CT image reconstructions and MIPs were obtained to evaluate the vascular anatomy. CONTRAST:  60 cc Isovue 370 COMPARISON:  None. FINDINGS: There are no filling defects in the pulmonary arterial tree to suggest acute pulmonary thromboembolism There is no evidence of aortic dissection or aneurysm. Great vessels are grossly patent. There is no abnormal mediastinal adenopathy. No pericardial effusion. Three vessel coronary artery calcification. Mitral annular calcification. Mild peripheral aortic valvular calcification. Large hiatal hernia No pneumothorax or pleural effusion. 6 mm right upper pole nodule on image 17. 6 mm left lower lobe pulmonary nodule on image 52. 7 mm left lower lobe pulmonary nodule on image 52. No acute bony deformity. Review of the MIP images confirms the above findings. IMPRESSION: No evidence of acute pulmonary thromboembolism Hiatal hernia. Multiple bilateral small pulmonary nodules. The largest in the left lower lobe measures 7 mm. Non-contrast chest CT at 3-6 months is recommended. If the nodules are stable at time of repeat CT, then future CT  at 18-24 months (from today's scan) is considered optional for low-risk patients, but is recommended for high-risk patients. This recommendation follows the consensus statement: Guidelines for Management of Incidental Pulmonary Nodules Detected on CT Images:From the Fleischner Society 2017; published online before print (10.1148/radiol.[id number]). Electronically Signed   By: Marybelle Killings M.D.   On: 10/01/2015 16:09   US Venous Img Lower Bilateral  Result Date: 10/01/2015 CLINICAL DATA:  Bilateral lower extremity edema, left greater than right. Evaluate for DVT. EXAM: BILATERAL LOWER EXTREMITY VENOUS DOPPLER ULTRASOUND TECHNIQUE: Gray-scale sonography with graded compression, as well as color Doppler and duplex ultrasound were performed to evaluate the lower extremity deep venous systems from the level of the common femoral vein and including the common femoral, femoral, profunda femoral, popliteal and calf veins including the posterior tibial, peroneal and gastrocnemius veins when visible. The superficial great saphenous vein was also interrogated. Spectral Doppler was utilized to evaluate flow at rest and with distal augmentation maneuvers in the common femoral, femoral and popliteal veins. COMPARISON:  Left lower extremity venous Doppler ultrasound - 10/12/2013 FINDINGS: Examination is degraded due to patient body habitus and poor sonographic window. RIGHT LOWER EXTREMITY Common Femoral Vein: No evidence of thrombus. Normal compressibility, respiratory phasicity and response to augmentation. Saphenofemoral Junction: No evidence of thrombus. Normal compressibility and flow on color Doppler imaging. Profunda Femoral Vein: No evidence of thrombus. Normal compressibility and flow on color Doppler imaging. Femoral Vein: No evidence of thrombus. Normal compressibility, respiratory phasicity and response to augmentation. Popliteal Vein: No evidence of thrombus. Normal compressibility, respiratory phasicity and  response to augmentation. Calf Veins: No evidence of thrombus. Normal compressibility and flow on color Doppler imaging. Superficial Great Saphenous Vein: No evidence of thrombus. Normal compressibility and flow on color Doppler imaging. Venous Reflux:  None. Other Findings:  None. LEFT LOWER EXTREMITY Common Femoral Vein: No evidence of thrombus. Normal compressibility, respiratory phasicity and response to augmentation. Saphenofemoral Junction: No evidence of thrombus. Normal compressibility and flow on color Doppler imaging. Profunda Femoral Vein: No evidence of thrombus. Normal compressibility and flow on color Doppler imaging. Femoral Vein: No  evidence of thrombus. Normal compressibility, respiratory phasicity and response to augmentation. Popliteal Vein: No evidence of thrombus. Normal compressibility, respiratory phasicity and response to augmentation. Calf Veins: No evidence of thrombus. Normal compressibility and flow on color Doppler imaging. Superficial Great Saphenous Vein: No evidence of thrombus. Normal compressibility and flow on color Doppler imaging. Venous Reflux:  None. Other Findings:  None. IMPRESSION: No evidence of DVT within either lower extremity. Electronically Signed   By: Sandi Mariscal M.D.   On: 10/01/2015 15:45   Dg Chest Portable 1 View  Result Date: 10/01/2015 CLINICAL DATA:  82 year old female with lower extremity swelling for several days, shortness of breath and chest pain. Initial encounter. EXAM: PORTABLE CHEST 1 VIEW COMPARISON:  12/28/2014 and earlier. FINDINGS: Portable AP upright view at 0850 hours. Chronic hiatal hernia. Stable cardiac size and mediastinal contours. Calcified aortic atherosclerosis. Mild acute increased interstitial opacity diffusely. No pneumothorax, pleural effusion or consolidation. IMPRESSION: 1. Mild pulmonary interstitial edema suspected. No other acute cardiopulmonary abnormality. 2.  Calcified aortic atherosclerosis. Electronically Signed   By: Genevie Ann M.D.   On: 10/01/2015 09:05    EKG:  Orders placed or performed during the hospital encounter of 10/01/15  . ED EKG  . ED EKG    ASSESSMENT AND PLAN:  Active Problems:   CHF (congestive heart failure) ([place]) #1. Acute on chronic respiratory failure with hypoxia due to acute on chronic diastolic CHF and COPD exacerbation, continue oxygen therapy, weaning off to room air as tolerated #2. Acute on chronic diastolic CHF, continue IV diuresis, following in's and outs, weight, follow clinically, kidney function remains stable #3. COPD exacerbation, initiate patient on antibiotic, Levaquin, DuoNeb nebs, steroids, budesonide, Tussionex, and Humibid, following clinically #4 acute bronchitis, initiate antibiotic, get sputum cultures if possible #5. Lower extremity swelling, no DVT, likely right-sided heart failure #6 chronic renal insufficiency, CK D stage III, stable with diuresis #7. Diabetes mellitus type 2. Hemoglobin A1c was 5.6, continue sliding scale insulin, watch for hypoglycemia episodes   Management plans discussed with the patient, family and they are in agreement.   DRUG ALLERGIES:  Allergies  Allergen Reactions  . Atenolol     Other reaction(s): Other (See Comments) Decreased heart rate  . Codeine     Rash, difficulty breathing, nausea.    CODE STATUS:     Code Status Orders        Start     Ordered   10/01/15 1145  Full code  Continuous     10/01/15 1144    Code Status History    Date Active Date Inactive Code Status Order ID Comments User Context   10/01/2015 11:44 AM 10/02/2015  7:48 AM Full Code [id number]  Bettey Costa, MD Inpatient    Advance Directive Documentation   Flowsheet Row Most Recent Value  Type of Advance Directive  Healthcare Power of Attorney  Pre-existing out of facility DNR order (yellow form or pink MOST form)  No data  "MOST" Form in Place?  No data      TOTAL TIME TAKING CARE OF THIS PATIENT: 40 minutes.    Theodoro Grist M.D on  10/02/2015 at 3:00 PM  Between 7am to 6pm - Pager - [phone]  After 6pm go to [email or web address] - password [email or web address] [facility]  [facility]  Office  [phone]  CC: Primary care physician; Leonel Ramsay, MD

## 2015-10-02 NOTE — Progress Notes (Signed)
*  PRELIMINARY RESULTS* Echocardiogram 2D Echocardiogram has been performed.  Sherrie Sport 10/02/2015, 10:28 AM

## 2015-10-03 ENCOUNTER — Encounter: Payer: Self-pay | Admitting: Student

## 2015-10-03 DIAGNOSIS — N183 Chronic kidney disease, stage 3 (moderate): Secondary | ICD-10-CM

## 2015-10-03 DIAGNOSIS — N179 Acute kidney failure, unspecified: Secondary | ICD-10-CM | POA: Diagnosis not present

## 2015-10-03 LAB — BASIC METABOLIC PANEL
ANION GAP: 11 (ref 5–15)
BUN: 57 mg/dL — AB (ref 6–20)
CHLORIDE: 100 mmol/L — AB (ref 101–111)
CO2: 28 mmol/L (ref 22–32)
Calcium: 8.4 mg/dL — ABNORMAL LOW (ref 8.9–10.3)
Creatinine, Ser: 1.92 mg/dL — ABNORMAL HIGH (ref 0.44–1.00)
GFR, EST AFRICAN AMERICAN: 26 mL/min — AB (ref >60)
GFR, EST NON AFRICAN AMERICAN: 23 mL/min — AB (ref >60)
Glucose, Bld: 185 mg/dL — ABNORMAL HIGH (ref 65–99)
POTASSIUM: 4.4 mmol/L (ref 3.5–5.1)
SODIUM: 139 mmol/L (ref 135–145)

## 2015-10-03 LAB — GLUCOSE, CAPILLARY
GLUCOSE-CAPILLARY: 299 mg/dL — AB (ref 65–99)
GLUCOSE-CAPILLARY: 171 mg/dL — AB (ref 65–99)
GLUCOSE-CAPILLARY: 160 mg/dL — AB (ref 65–99)
GLUCOSE-CAPILLARY: 303 mg/dL — AB (ref 65–99)
Glucose-Capillary: 189 mg/dL — ABNORMAL HIGH (ref 65–99)

## 2015-10-03 LAB — T4, FREE: FREE T4: 0.82 ng/dL (ref 0.61–1.12)

## 2015-10-03 MED ORDER — SODIUM CHLORIDE 0.9 % IV SOLN
INTRAVENOUS | Status: DC
  Administered 2015-10-03: 08:00:00 via INTRAVENOUS

## 2015-10-03 MED ORDER — CODEINE SULFATE 30 MG PO TABS: 15.0000 mg | ORAL_TABLET | ORAL | Status: DC | PRN

## 2015-10-03 MED ORDER — SENNA 8.6 MG PO TABS
1.0000 | ORAL_TABLET | Freq: Every day | ORAL | Status: DC | PRN
  Administered 2015-10-03: 8.6 mg via ORAL
  Filled 2015-10-03: qty 1

## 2015-10-03 MED ORDER — IPRATROPIUM-ALBUTEROL 0.5-2.5 (3) MG/3ML IN SOLN
3.0000 mL | Freq: Three times a day (TID) | RESPIRATORY_TRACT | Status: DC
  Administered 2015-10-04 (×2): 3 mL via RESPIRATORY_TRACT
  Filled 2015-10-03 (×2): qty 3

## 2015-10-03 MED ORDER — PREDNISONE 50 MG PO TABS
50.0000 mg | ORAL_TABLET | Freq: Every day | ORAL | Status: DC
  Administered 2015-10-04 – 2015-10-06 (×3): 50 mg via ORAL
  Filled 2015-10-03 (×3): qty 1

## 2015-10-03 MED ORDER — SODIUM CHLORIDE 0.9 % IV BOLUS (SEPSIS)
250.0000 mL | Freq: Once | INTRAVENOUS | Status: AC
  Administered 2015-10-03: 250 mL via INTRAVENOUS

## 2015-10-03 MED ORDER — DOCUSATE SODIUM 100 MG PO CAPS
100.0000 mg | ORAL_CAPSULE | Freq: Two times a day (BID) | ORAL | Status: DC
  Administered 2015-10-03 – 2015-10-11 (×17): 100 mg via ORAL
  Filled 2015-10-03 (×17): qty 1

## 2015-10-03 MED ORDER — SENNA 8.6 MG PO TABS: 1.0000 | ORAL_TABLET | Freq: Every day | ORAL | Status: DC

## 2015-10-03 MED ORDER — ENOXAPARIN SODIUM 40 MG/0.4ML ~~LOC~~ SOLN
40.0000 mg | SUBCUTANEOUS | Status: DC
  Administered 2015-10-04 – 2015-10-10 (×7): 40 mg via SUBCUTANEOUS
  Filled 2015-10-03 (×7): qty 0.4

## 2015-10-03 MED ORDER — SODIUM CHLORIDE 0.9 % IV SOLN
INTRAVENOUS | Status: AC
  Administered 2015-10-03: 17:00:00 via INTRAVENOUS

## 2015-10-03 NOTE — Progress Notes (Signed)
Anticoagulation monitoring(Lovenox):  82 yo  female ordered Lovenox 40 mg Q12h  Filed Weights   10/01/15 0844 10/02/15 0557 10/03/15 0515  Weight: 257 lb (116.6 kg) 250 lb 4.8 oz (113.5 kg) 253 lb 4.8 oz (114.9 kg)   Body mass index is 46.33 kg/m.   Lab Results  Component Value Date   CREATININE 1.92 (H) 10/03/2015   CREATININE 1.29 (H) 10/02/2015   CREATININE 1.27 (H) 10/01/2015   Estimated Creatinine Clearance: 25.2 mL/min (by C-G formula based on SCr of 1.92 mg/dL). Hemoglobin & Hematocrit     Component Value Date/Time   HGB 10.4 (L) 10/01/2015 0847   HGB 13.0 12/20/2013 1042   HCT 30.8 (L) 10/01/2015 0847   HCT 39.8 12/20/2013 1042     Per Protocol for Patient with estCrcl < 30 ml/min and BMI > 40, will transition to Lovenox 40 mg Q24h.

## 2015-10-03 NOTE — Progress Notes (Signed)
Initial Heart Failure Clinic appointment scheduled on October 19, 2015 at 10:30am. Thank you for the referral.

## 2015-10-03 NOTE — Progress Notes (Signed)
Hospital Problem List     Principal Problem:   Acute on chronic diastolic (congestive) heart failure (HCC) Active Problems:   HYPERTENSION, BENIGN   Acute renal failure superimposed on stage 3 chronic kidney disease [facility])     Patient Profile:   Primary Cardiologist: Dr. Rockey Situ  82 yo female w/ PMH of CAD (s/p DES to LAD in 2011), chronic diastolic CHF, HTN, and Type 2 DM who presented to [facility] on 10/01/2015 for worsening dyspnea and lower extremity edema.   Subjective   Reports her respiratory status has improved along with her lower extremity edema. Still with significant wheezing. Unable to sleep in the bed secondary to orthopnea.  Inpatient Medications    . amLODipine  10 mg Oral Daily  . aspirin  81 mg Oral Daily  . budesonide (PULMICORT) nebulizer solution  0.25 mg Nebulization BID  . chlorpheniramine-HYDROcodone  5 mL Oral Q12H  . cloNIDine  0.3 mg Transdermal Weekly  . clopidogrel  75 mg Oral Daily  . doxazosin  8 mg Oral Daily  . enoxaparin (LOVENOX) injection  40 mg Subcutaneous BID  . glimepiride  4 mg Oral QAC breakfast  . insulin aspart  0-20 Units Subcutaneous TID WC  . insulin aspart  0-5 Units Subcutaneous QHS  . ipratropium-albuterol  3 mL Nebulization Q4H  . levofloxacin  250 mg Oral Daily  . levothyroxine  125 mcg Oral Daily  . methylPREDNISolone (SOLU-MEDROL) injection  60 mg Intravenous Q24H  . oxybutynin  5 mg Oral Daily  . pantoprazole  40 mg Oral Daily  . simvastatin  40 mg Oral Daily  . sodium chloride  250 mL Intravenous Once  . sodium chloride flush  3 mL Intravenous Q12H    Vital Signs    Vitals:   10/02/15 1946 10/03/15 0020 10/03/15 0250 10/03/15 0515  BP:    (!) 83/56  Pulse:    73  Resp:    20  Temp:    97.6 F (36.4 C)  TempSrc:    Oral  SpO2: 97% 96% 94% 91%  Weight:    253 lb 4.8 oz (114.9 kg)  Height:        Intake/Output Summary (Last 24 hours) at 10/03/15 0810 Last data filed at 10/03/15 3235  Gross per 24 hour    Intake              720 ml  Output              450 ml  Net              270 ml   Filed Weights   10/01/15 0844 10/02/15 0557 10/03/15 0515  Weight: 257 lb (116.6 kg) 250 lb 4.8 oz (113.5 kg) 253 lb 4.8 oz (114.9 kg)    Physical Exam    General: Elderly Caucasian female appearing in no acute distress. Head: Normocephalic, atraumatic.  Neck: Supple without bruits, JVD elevated to 9cm,. Lungs:  Resp regular and unlabored, rales at bases bilaterally, diffuse wheezing. Heart: RRR, S1, S2, no S3, S4, or murmur; no rub. Abdomen: Soft, non-tender, non-distended with normoactive bowel sounds. No hepatomegaly. No rebound/guarding. No obvious abdominal masses. Extremities: No clubbing, cyanosis, 1+ pitting edema bilaterally. Distal pedal pulses are 2+ bilaterally. Neuro: Alert and oriented X 3. Moves all extremities spontaneously. Psych: Normal affect.  Labs    CBC  Recent Labs  10/01/15 0847  WBC 9.0  HGB 10.4*  HCT 30.8*  MCV 92.9  PLT 195  Basic Metabolic Panel  Recent Labs  10/02/15 0737 10/03/15 0445  NA 141 139  K 4.2 4.4  CL 103 100*  CO2 28 28  GLUCOSE 126* 185*  BUN 33* 57*  CREATININE 1.29* 1.92*  CALCIUM 8.9 8.4*   Liver Function Tests  Recent Labs  10/01/15 0847  AST 18  ALT 14  ALKPHOS 81  BILITOT 0.3  PROT 6.4*  ALBUMIN 3.7   No results for input(s): LIPASE, AMYLASE in the last 72 hours. Cardiac Enzymes  Recent Labs  10/01/15 1224 10/01/15 1743 10/01/15 2346  TROPONINI <0.03 <0.03 <0.03   BNP Invalid input(s): POCBNP D-Dimer No results for input(s): DDIMER in the last 72 hours. Hemoglobin A1C  Recent Labs  10/01/15 1224  HGBA1C 5.6   Fasting Lipid Panel No results for input(s): CHOL, HDL, LDLCALC, TRIG, CHOLHDL, LDLDIRECT in the last 72 hours. Thyroid Function Tests  Recent Labs  10/01/15 1224  TSH 5.103*    Telemetry    NSR, HR in 70's - 80's with PAC's.  ECG    No new tracings.    Cardiac Studies and  Radiology    Ct Angio Chest Pe W Or Wo Contrast  Result Date: 10/01/2015 CLINICAL DATA:  Chest pain and short of breath EXAM: CT ANGIOGRAPHY CHEST WITH CONTRAST TECHNIQUE: Multidetector CT imaging of the chest was performed using the standard protocol during bolus administration of intravenous contrast. Multiplanar CT image reconstructions and MIPs were obtained to evaluate the vascular anatomy. CONTRAST:  60 cc Isovue 370 COMPARISON:  None. FINDINGS: There are no filling defects in the pulmonary arterial tree to suggest acute pulmonary thromboembolism There is no evidence of aortic dissection or aneurysm. Great vessels are grossly patent. There is no abnormal mediastinal adenopathy. No pericardial effusion. Three vessel coronary artery calcification. Mitral annular calcification. Mild peripheral aortic valvular calcification. Large hiatal hernia No pneumothorax or pleural effusion. 6 mm right upper pole nodule on image 17. 6 mm left lower lobe pulmonary nodule on image 52. 7 mm left lower lobe pulmonary nodule on image 52. No acute bony deformity. Review of the MIP images confirms the above findings. IMPRESSION: No evidence of acute pulmonary thromboembolism Hiatal hernia. Multiple bilateral small pulmonary nodules. The largest in the left lower lobe measures 7 mm. Non-contrast chest CT at 3-6 months is recommended. If the nodules are stable at time of repeat CT, then future CT at 18-24 months (from today's scan) is considered optional for low-risk patients, but is recommended for high-risk patients. This recommendation follows the consensus statement: Guidelines for Management of Incidental Pulmonary Nodules Detected on CT Images:From the Fleischner Society 2017; published online before print (10.1148/radiol.[id number]). Electronically Signed   By: Marybelle Killings M.D.   On: 10/01/2015 16:09   US Venous Img Lower Bilateral  Result Date: 10/01/2015 CLINICAL DATA:  Bilateral lower extremity edema, left greater  than right. Evaluate for DVT. EXAM: BILATERAL LOWER EXTREMITY VENOUS DOPPLER ULTRASOUND TECHNIQUE: Gray-scale sonography with graded compression, as well as color Doppler and duplex ultrasound were performed to evaluate the lower extremity deep venous systems from the level of the common femoral vein and including the common femoral, femoral, profunda femoral, popliteal and calf veins including the posterior tibial, peroneal and gastrocnemius veins when visible. The superficial great saphenous vein was also interrogated. Spectral Doppler was utilized to evaluate flow at rest and with distal augmentation maneuvers in the common femoral, femoral and popliteal veins. COMPARISON:  Left lower extremity venous Doppler ultrasound - 10/12/2013 FINDINGS: Examination is degraded  due to patient body habitus and poor sonographic window. RIGHT LOWER EXTREMITY Common Femoral Vein: No evidence of thrombus. Normal compressibility, respiratory phasicity and response to augmentation. Saphenofemoral Junction: No evidence of thrombus. Normal compressibility and flow on color Doppler imaging. Profunda Femoral Vein: No evidence of thrombus. Normal compressibility and flow on color Doppler imaging. Femoral Vein: No evidence of thrombus. Normal compressibility, respiratory phasicity and response to augmentation. Popliteal Vein: No evidence of thrombus. Normal compressibility, respiratory phasicity and response to augmentation. Calf Veins: No evidence of thrombus. Normal compressibility and flow on color Doppler imaging. Superficial Great Saphenous Vein: No evidence of thrombus. Normal compressibility and flow on color Doppler imaging. Venous Reflux:  None. Other Findings:  None. LEFT LOWER EXTREMITY Common Femoral Vein: No evidence of thrombus. Normal compressibility, respiratory phasicity and response to augmentation. Saphenofemoral Junction: No evidence of thrombus. Normal compressibility and flow on color Doppler imaging. Profunda  Femoral Vein: No evidence of thrombus. Normal compressibility and flow on color Doppler imaging. Femoral Vein: No evidence of thrombus. Normal compressibility, respiratory phasicity and response to augmentation. Popliteal Vein: No evidence of thrombus. Normal compressibility, respiratory phasicity and response to augmentation. Calf Veins: No evidence of thrombus. Normal compressibility and flow on color Doppler imaging. Superficial Great Saphenous Vein: No evidence of thrombus. Normal compressibility and flow on color Doppler imaging. Venous Reflux:  None. Other Findings:  None. IMPRESSION: No evidence of DVT within either lower extremity. Electronically Signed   By: Sandi Mariscal M.D.   On: 10/01/2015 15:45   Dg Chest Portable 1 View  Result Date: 10/01/2015 CLINICAL DATA:  82 year old female with lower extremity swelling for several days, shortness of breath and chest pain. Initial encounter. EXAM: PORTABLE CHEST 1 VIEW COMPARISON:  12/28/2014 and earlier. FINDINGS: Portable AP upright view at 0850 hours. Chronic hiatal hernia. Stable cardiac size and mediastinal contours. Calcified aortic atherosclerosis. Mild acute increased interstitial opacity diffusely. No pneumothorax, pleural effusion or consolidation. IMPRESSION: 1. Mild pulmonary interstitial edema suspected. No other acute cardiopulmonary abnormality. 2.  Calcified aortic atherosclerosis. Electronically Signed   By: Genevie Ann M.D.   On: 10/01/2015 09:05    Echocardiogram: 10/02/2015 Study Conclusions  - Procedure narrative: Transthoracic echocardiography. The study   was technically difficult, as a result of restricted patient   mobility. - Left ventricle: The cavity size was normal. Wall thickness was   increased in a pattern of moderate LVH. Systolic function was   normal. The estimated ejection fraction was in the range of 55%   to 60%. Doppler parameters are consistent with abnormal left   ventricular relaxation (grade 1 diastolic  dysfunction). - Mitral valve: Severely calcified annulus. - Left atrium: The atrium was mildly dilated.  Assessment & Plan    1.  Acute on chronic diastolic heart failure - presented with worsening dyspnea and lower extremity edema for the past several days. CXR on admission shows mild pulmonary interstitial edema. BNP normal at 53.0.  - echo this admission shows preserved EF of 55-60% with Grade 1 DD.  - started on IV Lasix '40mg'$  BID at time of admission. Recorded net output of -2.1L. Weight is down 4 lbs. With creatinine jumping from 1.27 to 1.92, would recommend holding IV Lasix today. Possibly resume tomorrow with IV Lasix '40mg'$  daily if creatinine is improving.  2. Coronary artery disease  - s/p DES to LAD in 2011 - denies any recent chest discomfort. Cyclic troponin values negative. - continue ASA, statin, and Plavix.   3. Essential hypertension - BP  has been variable over the past 24 hours at 83/47 - 128/66. - SBP 83 this AM. With elevated creatinine, will hold Losartan and HCTZ. Restart as BP and creatinine improves. Continue Amlodipine. No BB (had bradycardia with this in the past).  4. Acute bronchitis/ COPD Exacerbation - started on antibiotics, steroids, and nebulizer treatments.   - per admitting team  5. Acute on Chronic Stage 3 CKD - creatinine 1.27 on admission, elevated to 1.92 on 10/03/2015. - hold ARB and HCTZ today. Would continue to hold HCTZ while the patient is receiving IV Lasix this admission to avoid dual diuretic therapy.   Signed, Erma Heritage , PA-C 8:10 AM 10/03/2015 Pager: [phone]

## 2015-10-03 NOTE — Discharge Instructions (Signed)
Heart Failure Clinic appointment on October 19, 2015 at 10:30am with Darylene Price, [place]. Please call [phone] to reschedule.

## 2015-10-03 NOTE — Progress Notes (Signed)
Inpatient Diabetes Program Recommendations  AACE/ADA: New Consensus Statement on Inpatient Glycemic Control (2015)  Target Ranges:  Prepandial:   less than 140 mg/dL      Peak postprandial:   less than 180 mg/dL (1-2 hours)      Critically ill patients:  140 - 180 mg/dL   Results for OAKLEY, ORBAN (MRN [id number]) as of 10/03/2015 09:20  Ref. Range 10/02/2015 07:28 10/02/2015 11:15 10/02/2015 16:38 10/02/2015 21:31 10/03/2015 07:29  Glucose-Capillary Latest Ref Range: 65 - 99 mg/dL 136 (H) 131 (H) 294 (H) 299 (H) 171 (H)   Review of Glycemic Control  Diabetes history: DM2 Outpatient Diabetes medications: Actos 15 mg daily, Metformin 1000 mg bid, Amaryl 4 mg daily Current orders for Inpatient glycemic control: Amaryl 4 mg QAM, Novolog 0-20 units TID with meals, Novolog 0-5 units QHS  Inpatient Diabetes Program Recommendations: Insulin - Meal Coverage: If steroid are continued, please consider ordering Novolog 4 units TID with meals for meal coverage if patient eats at least 50% of meals.  Thanks, Barnie Alderman, RN, MSN, CDE Diabetes Coordinator Inpatient Diabetes Program [phone] (Team Pager from [place] to [place]) [phone] ([facility] office) [phone] [facility] office) [phone] [facility] office)

## 2015-10-03 NOTE — Progress Notes (Signed)
PT Cancellation Note  Patient Details Name: Amy [name] MRN: [id number] DOB: [birth year]   Cancelled Treatment:    Reason Eval/Treat Not Completed: Patient not medically ready   Attempted x 2 this am.  On first attempt pt with c/o SOB.  Sats 94% on room air.  Respiratory therapy in for breathing treatment.  Returned after and pt declined stating she continued to feel SOB and was coughing and generally fatigued.  Declined session. Verbal review of HEP and encouraged to do exercises as she felt able.   [name], PTA 10/03/15, 1:25 PM

## 2015-10-03 NOTE — Progress Notes (Signed)
[place] [facility] [place] NAME: Amy [name]    MR#:  [id number]  DATE OF BIRTH:  [birth year]  SUBJECTIVE:  CHIEF COMPLAINT:   Chief Complaint  Patient presents with  . Angioedema  . Chest Pain  . Shortness of Breath  The patient is 82 year old Caucasian female with past medical history significant for history of chronic diastolic CHF who presents to the hospital with complaints of shortness of breath, dyspnea on exertion, lower extremity swelling, orthopnea, PND, wheezing, cough. Patient's chest x-ray revealed mild pulmonary edema. Lower extremity Dopplers showed no DVT bilaterally. CT angiogram of the chest showed no evidence of acute thromboembolism, hiatal hernia, multiple bilateral small pulmonary nodules, repeat the CT was recommended As outpatient. Patient feels some better today, however, still some cough, wheezing, shortness of breath. Patient was noted to be hypotensive, and in acute renal failure with creatinine of 1.92. Today, up from 1.29. Blood pressure medications as well as Lasix were discontinued. She is weaned off to room air, O2 sats were 94%  Review of Systems  Constitutional: Negative for chills, fever and weight loss.  HENT: Negative for congestion.   Eyes: Negative for blurred vision and double vision.  Respiratory: Positive for cough, sputum production, shortness of breath and wheezing.   Cardiovascular: Positive for orthopnea, leg swelling and PND. Negative for chest pain and palpitations.  Gastrointestinal: Negative for abdominal pain, blood in stool, constipation, diarrhea, nausea and vomiting.  Genitourinary: Negative for dysuria, frequency, hematuria and urgency.  Musculoskeletal: Negative for falls.  Neurological: Positive for weakness. Negative for dizziness, tremors, focal weakness and headaches.  Endo/Heme/Allergies: Does not bruise/bleed easily.  Psychiatric/Behavioral: Negative for depression. The patient  does not have insomnia.     VITAL SIGNS: Blood pressure 127/70, pulse 73, temperature 97.6 F (36.4 C), temperature source Oral, resp. rate 20, height '5\' 2"'$  (1.575 m), weight 114.9 kg (253 lb 4.8 oz), SpO2 94 %.  PHYSICAL EXAMINATION:   GENERAL:  82 y.o.-year-old patient lying in the bed in mild respiratory distress, mildly tachypneic, but more comfortable today, still intermittent dry coughing  EYES: Pupils equal, round, reactive to light and accommodation. No scleral icterus. Extraocular muscles intact.  HEENT: Head atraumatic, normocephalic. Oropharynx and nasopharynx clear.  NECK:  Supple, no jugular venous distention. No thyroid enlargement, no tenderness.  LUNGS: Diminished breath sounds bilaterally, bilateral wheezing, no rales,rhonchi , crepitations . Using accessory muscles of respiration .  CARDIOVASCULAR: S1, S2, rhythm is regular. No murmurs, rubs, or gallops.  ABDOMEN: Soft, nontender, nondistended. Bowel sounds present. No organomegaly or mass.  EXTREMITIES: 1+ lower extremity and pedal edema, no cyanosis, or clubbing.  NEUROLOGIC: Cranial nerves II through XII are intact. Muscle strength 5/5 in all extremities. Sensation intact. Gait not checked.  PSYCHIATRIC: The patient is alert and oriented x 3.  SKIN: No obvious rash, lesion, or ulcer.   ORDERS/RESULTS REVIEWED:   CBC  Recent Labs Lab 10/01/15 0847  WBC 9.0  HGB 10.4*  HCT 30.8*  PLT 195  MCV 92.9  MCH 31.5  MCHC 33.9  RDW 18.3*   ------------------------------------------------------------------------------------------------------------------  Chemistries   Recent Labs Lab 10/01/15 0847 10/02/15 0737 10/03/15 0445  NA 139 141 139  K 4.6 4.2 4.4  CL 107 103 100*  CO2 '25 28 28  '$ GLUCOSE 201* 126* 185*  BUN 30* 33* 57*  CREATININE 1.27* 1.29* 1.92*  CALCIUM 8.8* 8.9 8.4*  AST 18  --   --   ALT 14  --   --  ALKPHOS 81  --   --   BILITOT 0.3  --   --     ------------------------------------------------------------------------------------------------------------------ estimated creatinine clearance is 25.2 mL/min (by C-G formula based on SCr of 1.92 mg/dL). ------------------------------------------------------------------------------------------------------------------  Recent Labs  10/01/15 1224  TSH 5.103*    Cardiac Enzymes  Recent Labs Lab 10/01/15 1224 10/01/15 1743 10/01/15 2346  TROPONINI <0.03 <0.03 <0.03   ------------------------------------------------------------------------------------------------------------------ Invalid input(s): POCBNP ---------------------------------------------------------------------------------------------------------------  RADIOLOGY: No results found.  EKG:  Orders placed or performed during the hospital encounter of 10/01/15  . ED EKG  . ED EKG    ASSESSMENT AND PLAN:  Principal Problem:   Acute on chronic diastolic (congestive) heart failure (HCC) Active Problems:   HYPERTENSION, BENIGN   Acute renal failure superimposed on stage 3 chronic kidney disease (HCC)   Atherosclerosis of coronary artery bypass graft of native heart without angina pectoris #1. Acute on chronic respiratory failure with hypoxia due to acute on chronic diastolic CHF and COPD exacerbation, Weaned off oxygen therapy, now on room air  #2. Acute on chronic diastolic CHF, off diuresis, as kidney function worsened, discontinued blood pressure medications due to hypotension #3. COPD exacerbation, continue antibiotic , Levaquin, DuoNeb nebs, taper steroids, continue budesonide, Tussionex, and Humibid, some improved clinically, weaned off oxygen therapy #4 acute bronchitis, continue levofloxacin, unable to get sputum cultures due to  dry cough #5. Lower extremity swelling, no DVT, likely right-sided heart failure, stable #6 . Acute on chronic renal failure, underlying CK D stage III, worsened with diuresis, now  on IV fluids, follow creatinine in the morning #7. Diabetes mellitus type 2. Hemoglobin A1c was 5.6, continue sliding scale insulin, watch for hypoglycemia episodes #8. Hypotension, blood pressure medications were discontinued, on IV fluids, blood pressure has improved   Management plans discussed with the patient, family and they are in agreement.   DRUG ALLERGIES:  Allergies  Allergen Reactions  . Atenolol     Other reaction(s): Other (See Comments) Decreased heart rate  . Codeine     Rash, difficulty breathing, nausea.    CODE STATUS:     Code Status Orders        Start     Ordered   10/01/15 1145  Full code  Continuous     10/01/15 1144    Code Status History    Date Active Date Inactive Code Status Order ID Comments User Context   10/01/2015 11:44 AM 10/02/2015  7:48 AM Full Code [id number]  Bettey Costa, MD Inpatient    Advance Directive Documentation   Flowsheet Row Most Recent Value  Type of Advance Directive  Healthcare Power of Attorney  Pre-existing out of facility DNR order (yellow form or pink MOST form)  No data  "MOST" Form in Place?  No data      TOTAL TIME TAKING CARE OF THIS PATIENT: 40 minutes.    Theodoro Grist M.D on 10/03/2015 at 4:25 PM  Between 7am to 6pm - Pager - [phone]  After 6pm go to [email or web address] - password [email or web address] [facility]  [facility]  Office  [phone]  CC: Primary care physician; Leonel Ramsay, MD

## 2015-10-04 DIAGNOSIS — I4891 Unspecified atrial fibrillation: Secondary | ICD-10-CM | POA: Diagnosis not present

## 2015-10-04 DIAGNOSIS — D649 Anemia, unspecified: Secondary | ICD-10-CM | POA: Diagnosis present

## 2015-10-04 DIAGNOSIS — D638 Anemia in other chronic diseases classified elsewhere: Secondary | ICD-10-CM | POA: Diagnosis present

## 2015-10-04 DIAGNOSIS — J441 Chronic obstructive pulmonary disease with (acute) exacerbation: Secondary | ICD-10-CM

## 2015-10-04 LAB — CBC
HEMATOCRIT: 28 % — AB (ref 35.0–47.0)
Hemoglobin: 9.5 g/dL — ABNORMAL LOW (ref 12.0–16.0)
MCH: 31.4 pg (ref 26.0–34.0)
MCHC: 33.8 g/dL (ref 32.0–36.0)
MCV: 93.1 fL (ref 80.0–100.0)
Platelets: 223 10*3/uL (ref 150–440)
RBC: 3.01 MIL/uL — AB (ref 3.80–5.20)
RDW: 18.1 % — AB (ref 11.5–14.5)
WBC: 12.1 10*3/uL — AB (ref 3.6–11.0)

## 2015-10-04 LAB — BASIC METABOLIC PANEL
Anion gap: 9 (ref 5–15)
BUN: 73 mg/dL — AB (ref 6–20)
CHLORIDE: 102 mmol/L (ref 101–111)
CO2: 26 mmol/L (ref 22–32)
Calcium: 8.1 mg/dL — ABNORMAL LOW (ref 8.9–10.3)
Creatinine, Ser: 1.8 mg/dL — ABNORMAL HIGH (ref 0.44–1.00)
GFR calc Af Amer: 28 mL/min — ABNORMAL LOW (ref >60)
GFR calc non Af Amer: 24 mL/min — ABNORMAL LOW (ref >60)
Glucose, Bld: 195 mg/dL — ABNORMAL HIGH (ref 65–99)
POTASSIUM: 4.3 mmol/L (ref 3.5–5.1)
SODIUM: 137 mmol/L (ref 135–145)

## 2015-10-04 LAB — GLUCOSE, CAPILLARY
GLUCOSE-CAPILLARY: 116 mg/dL — AB (ref 65–99)
GLUCOSE-CAPILLARY: 306 mg/dL — AB (ref 65–99)
Glucose-Capillary: 202 mg/dL — ABNORMAL HIGH (ref 65–99)
Glucose-Capillary: 263 mg/dL — ABNORMAL HIGH (ref 65–99)

## 2015-10-04 LAB — MAGNESIUM: Magnesium: 2.2 mg/dL (ref 1.7–2.4)

## 2015-10-04 MED ORDER — IPRATROPIUM-ALBUTEROL 0.5-2.5 (3) MG/3ML IN SOLN
3.0000 mL | RESPIRATORY_TRACT | Status: DC
  Administered 2015-10-04 – 2015-10-11 (×39): 3 mL via RESPIRATORY_TRACT
  Filled 2015-10-04 (×40): qty 3

## 2015-10-04 MED ORDER — FUROSEMIDE 20 MG PO TABS
20.0000 mg | ORAL_TABLET | Freq: Two times a day (BID) | ORAL | Status: DC
  Administered 2015-10-04 (×2): 20 mg via ORAL
  Filled 2015-10-04 (×2): qty 1

## 2015-10-04 MED ORDER — POTASSIUM CHLORIDE CRYS ER 10 MEQ PO TBCR
10.0000 meq | EXTENDED_RELEASE_TABLET | Freq: Two times a day (BID) | ORAL | Status: DC
  Administered 2015-10-04 – 2015-10-11 (×15): 10 meq via ORAL
  Filled 2015-10-04 (×15): qty 1

## 2015-10-04 NOTE — Progress Notes (Signed)
Physical Therapy Treatment Patient Details Name: Amy [name] MRN: [id number] DOB: [birth year] Today's Date: Oct 19, 2015    History of Present Illness Pt is an 82 year old female with diastolic heart failure and preserved ejection fraction, hypothyroidism and diabetes who presents with acute on chronic hypoxic respiratory failure.    PT Comments    Pt in chair, reports she continues to feel poorly.  Stated she spent most of the night sitting in recliner as it was easier to breath.  She continues with cough and general SOB. Chart reviewed.  Sats 98% on room air.  She participated in seated AROM x 10 reps for BLE's with slow reps and frequent rest breaks.  Sats remained in min to high 90's.  HR in 70's.   Gait and mobility deferred today due to medical status.    Follow Up Recommendations  Home health PT     Equipment Recommendations  Rolling walker with 5" wheels    Recommendations for Other Services       Precautions / Restrictions Precautions Precautions: Fall Restrictions Weight Bearing Restrictions: No    Mobility  Bed Mobility                  Transfers                 General transfer comment: in chair  Ambulation/Gait                 Stairs            Wheelchair Mobility    Modified Rankin (Stroke Patients Only)       Balance                                    Cognition Arousal/Alertness: Awake/alert Behavior During Therapy: WFL for tasks assessed/performed Overall Cognitive Status: Within Functional Limits for tasks assessed                      Exercises Other Exercises Other Exercises: Pt seated in chair, participated in ankle pumps, LAQ, marches and ab/Addcution x 10 BLE's    General Comments        Pertinent Vitals/Pain Pain Assessment: No/denies pain    Home Living                      Prior Function            PT Goals (current goals can now be found in the care  plan section)      Frequency  Min 2X/week    PT Plan Current plan remains appropriate    Co-evaluation             End of Session   Activity Tolerance: Patient limited by fatigue Patient left: in chair     Time: 0737-1062 PT Time Calculation (min) (ACUTE ONLY): 10 min  Charges:  $Therapeutic Exercise: 8-22 mins                    G Codes:      [name] 10/19/15, 10:00 AM

## 2015-10-04 NOTE — Progress Notes (Signed)
Patient remains alert and oriented, denies any pain at this time, patient presently OOB sitting in the chair, expiratory wheezing, dyspnea on excertion

## 2015-10-04 NOTE — Progress Notes (Signed)
[facility] [place] NAME: Amy [name]    MR#:  [id number]  DATE OF BIRTH:  [birth year]  SUBJECTIVE:  CHIEF COMPLAINT:   Chief Complaint  Patient presents with  . Angioedema  . Chest Pain  . Shortness of Breath  The patient is 82 year old Caucasian female with past medical history significant for history of chronic diastolic CHF who presents to the hospital with complaints of shortness of breath, dyspnea on exertion, lower extremity swelling, orthopnea, PND, wheezing, cough. Patient's chest x-ray revealed mild pulmonary edema. Lower extremity Dopplers showed no DVT bilaterally. CT angiogram of the chest showed no evidence of acute thromboembolism, hiatal hernia, multiple bilateral small pulmonary nodules, repeat the CT was recommended As outpatient. Patient feels some better today, however, still some cough, wheezing, shortness of breath. Patient was noted to be hypotensive, and in acute renal failure with creatinine of 1.92. yesterday, up from 1.29. Blood pressure medications as well as Lasix were discontinued. Creatinine has improved some, She is weaned off to room air, O2 sats were 93% The patient feels satisfactory, complains of cough, dry, continues to wheeze  Review of Systems  Constitutional: Negative for chills, fever and weight loss.  HENT: Negative for congestion.   Eyes: Negative for blurred vision and double vision.  Respiratory: Positive for cough, sputum production, shortness of breath and wheezing.   Cardiovascular: Positive for orthopnea, leg swelling and PND. Negative for chest pain and palpitations.  Gastrointestinal: Negative for abdominal pain, blood in stool, constipation, diarrhea, nausea and vomiting.  Genitourinary: Negative for dysuria, frequency, hematuria and urgency.  Musculoskeletal: Negative for falls.  Neurological: Positive for weakness. Negative for dizziness, tremors, focal weakness and headaches.   Endo/Heme/Allergies: Does not bruise/bleed easily.  Psychiatric/Behavioral: Negative for depression. The patient does not have insomnia.     VITAL SIGNS: Blood pressure (!) 147/32, pulse 76, temperature 97.9 F (36.6 C), temperature source Oral, resp. rate 18, height '5\' 2"'$  (1.575 m), weight 114.1 kg (251 lb 8.7 oz), SpO2 93 %.  PHYSICAL EXAMINATION:   GENERAL:  82 y.o.-year-old patient lying in the bed in mild respiratory distress, remains somewhat tachypneic, but more comfortable today, intermittent dry coughing  EYES: Pupils equal, round, reactive to light and accommodation. No scleral icterus. Extraocular muscles intact.  HEENT: Head atraumatic, normocephalic. Oropharynx and nasopharynx clear.  NECK:  Supple, no jugular venous distention. No thyroid enlargement, no tenderness.  LUNGS: Better air entrance  bilaterally, bilateral wheezing, no rales,rhonchi , crepitations . Intermittently using accessory muscles of respiration, especially with cough .  CARDIOVASCULAR: S1, S2, rhythm is regular. No murmurs, rubs, or gallops.  ABDOMEN: Soft, nontender, nondistended. Bowel sounds present. No organomegaly or mass.  EXTREMITIES: 1+ lower extremity and pedal edema, no cyanosis, or clubbing.  NEUROLOGIC: Cranial nerves II through XII are intact. Muscle strength 5/5 in all extremities. Sensation intact. Gait not checked.  PSYCHIATRIC: The patient is alert and oriented x 3.  SKIN: No obvious rash, lesion, or ulcer.   ORDERS/RESULTS REVIEWED:   CBC  Recent Labs Lab 10/01/15 0847 10/04/15 0337  WBC 9.0 12.1*  HGB 10.4* 9.5*  HCT 30.8* 28.0*  PLT 195 223  MCV 92.9 93.1  MCH 31.5 31.4  MCHC 33.9 33.8  RDW 18.3* 18.1*   ------------------------------------------------------------------------------------------------------------------  Chemistries   Recent Labs Lab 10/01/15 0847 10/02/15 0737 10/03/15 0445 10/04/15 0337  NA 139 141 139 137  K 4.6 4.2 4.4 4.3  CL 107 103 100* 102   CO2  $'25 28 28 26  'I$ GLUCOSE 201* 126* 185* 195*  BUN 30* 33* 57* 73*  CREATININE 1.27* 1.29* 1.92* 1.80*  CALCIUM 8.8* 8.9 8.4* 8.1*  MG  --   --   --  2.2  AST 18  --   --   --   ALT 14  --   --   --   ALKPHOS 81  --   --   --   BILITOT 0.3  --   --   --    ------------------------------------------------------------------------------------------------------------------ estimated creatinine clearance is 26.8 mL/min (by C-G formula based on SCr of 1.8 mg/dL). ------------------------------------------------------------------------------------------------------------------ No results for input(s): TSH, T4TOTAL, T3FREE, THYROIDAB in the last 72 hours.  Invalid input(s): FREET3  Cardiac Enzymes  Recent Labs Lab 10/01/15 1224 10/01/15 1743 10/01/15 2346  TROPONINI <0.03 <0.03 <0.03   ------------------------------------------------------------------------------------------------------------------ Invalid input(s): POCBNP ---------------------------------------------------------------------------------------------------------------  RADIOLOGY: No results found.  EKG:  Orders placed or performed during the hospital encounter of 10/01/15  . ED EKG  . ED EKG    ASSESSMENT AND PLAN:  Principal Problem:   Acute on chronic diastolic (congestive) heart failure (HCC) Active Problems:   Essential hypertension   Acute renal failure superimposed on stage 3 chronic kidney disease (HCC)   Atherosclerosis of coronary artery bypass graft of native heart without angina pectoris   New onset atrial fibrillation (HCC)   Anemia   COPD exacerbation ([employer]) #1. Acute on chronic respiratory failure with hypoxia due to acute on chronic diastolic CHF and COPD exacerbation, Weaned off oxygen therapy, now on room air , remains relatively stable off diuretic #2. Acute on chronic diastolic CHF, off diuresis, as kidney function worsened, off blood pressure medications due to hypotension, urinary output  remains good, patient is negative of  approximately 2 L since admission #3. COPD exacerbation, continue  Levaquin, DuoNeb nebs, steroids, continue budesonide, Tussionex, and Humibid, some improved clinically, weaned off oxygen therapy, continues to have significant wheezing and cough, continue steroids at current doses #4 acute bronchitis, continue levofloxacin, unable to get sputum cultures due to  dry cough #5. Lower extremity swelling, no DVT, likely right-sided heart failure, stable #6 . Acute on chronic renal failure, underlying CK D stage III, worsened with diuresis, now of IV fluids, improved creatinine , follow in the morning #7. Diabetes mellitus type 2. Hemoglobin A1c was 5.6, continue sliding scale insulin, watch for hypoglycemia episodes #8. Hypotension, blood pressure medications were discontinued, was on IV fluids, blood pressure has improved, restart blood pressure medications if needed   Management plans discussed with the patient, family and they are in agreement.   DRUG ALLERGIES:  Allergies  Allergen Reactions  . Atenolol     Other reaction(s): Other (See Comments) Decreased heart rate  . Codeine     Rash, difficulty breathing, nausea.    CODE STATUS:     Code Status Orders        Start     Ordered   10/01/15 1145  Full code  Continuous     10/01/15 1144    Code Status History    Date Active Date Inactive Code Status Order ID Comments User Context   10/01/2015 11:44 AM 10/02/2015  7:48 AM Full Code [id number]  Bettey Costa, MD Inpatient    Advance Directive Documentation   Flowsheet Row Most Recent Value  Type of Advance Directive  Healthcare Power of Attorney  Pre-existing out of facility DNR order (yellow form or pink MOST form)  No data  "MOST" Form in  Place?  No data      TOTAL TIME TAKING CARE OF THIS PATIENT: 35 minutes.    Theodoro Grist M.D on 10/04/2015 at 5:15 PM  Between 7am to 6pm - Pager - [phone]  After 6pm go to [email or web address] -  password [email or web address] [facility]  [facility]  Office  [phone]  CC: Primary care physician; Leonel Ramsay, MD

## 2015-10-04 NOTE — Progress Notes (Signed)
Patient: Amy [name] / Admit Date: 10/01/2015 / Date of Encounter: 10/04/2015, 8:51 AM   Subjective: IV Lasix was held on 8/9 in the setting of elevated renal function to 1.91. SCr currently 1.80 this morning. Total UOP of 2.5 L for the admission. WBC elevated at 12, 000, possibly in the setting of prednisone. Breathing this morning improved, though still with significant nonproductive cough. Had an episode of palpitations the prior evening. She was noted to have developed new onset Afib between 4-5 PM on 8/9. Currently in sinus rhythm.   Review of Systems: Review of Systems  Constitutional: Positive for malaise/fatigue. Negative for chills, diaphoresis, fever and weight loss.  HENT: Negative for congestion.   Eyes: Negative for discharge and redness.  Respiratory: Positive for cough, shortness of breath and wheezing. Negative for hemoptysis and sputum production.   Cardiovascular: Positive for palpitations and leg swelling. Negative for chest pain, orthopnea, claudication and PND.  Gastrointestinal: Positive for melena. Negative for abdominal pain, blood in stool, heartburn, nausea and vomiting.  Genitourinary: Negative for hematuria.  Musculoskeletal: Negative for falls and myalgias.  Skin: Negative for rash.  Neurological: Positive for weakness. Negative for dizziness, tingling, tremors, sensory change, speech change, focal weakness and loss of consciousness.  Endo/Heme/Allergies: Does not bruise/bleed easily.  Psychiatric/Behavioral: Negative for substance abuse. The patient is not nervous/anxious.     Objective: Telemetry: Currently in NSR in the 80's bpm. Episode of new onset rate-controlled Afib between 4-5 PMN on 8/9. Physical Exam: Blood pressure (!) 146/42, pulse 75, temperature 97.6 F (36.4 C), temperature source Oral, resp. rate 16, height '5\' 2"'$  (1.575 m), weight 251 lb 8.7 oz (114.1 kg), SpO2 97 %. Body mass index is 46.01 kg/m. General: Well developed, well  nourished, in no acute distress. Head: Normocephalic, atraumatic, sclera non-icteric, no xanthomas, nares are without discharge. Neck: Negative for carotid bruits. JVP not elevated. Lungs: Diffuse wheezing bilaterally. Breathing is unlabored. Heart: RRR S1 S2 without murmurs, rubs, or gallops.  Abdomen: Soft, non-tender, non-distended with normoactive bowel sounds. No rebound/guarding. Extremities: No clubbing or cyanosis.Trace pre-tibial edema along bilateral LE to the mid shins. Distal pedal pulses are 2+ and equal bilaterally. Neuro: Alert and oriented X 3. Moves all extremities spontaneously. Psych:  Responds to questions appropriately with a normal affect.   Intake/Output Summary (Last 24 hours) at 10/04/15 0851 Last data filed at 10/04/15 0810  Gross per 24 hour  Intake              480 ml  Output              850 ml  Net             -370 ml    Inpatient Medications:  . amLODipine  10 mg Oral Daily  . aspirin  81 mg Oral Daily  . budesonide (PULMICORT) nebulizer solution  0.25 mg Nebulization BID  . chlorpheniramine-HYDROcodone  5 mL Oral Q12H  . cloNIDine  0.3 mg Transdermal Weekly  . clopidogrel  75 mg Oral Daily  . docusate sodium  100 mg Oral BID  . doxazosin  8 mg Oral Daily  . enoxaparin (LOVENOX) injection  40 mg Subcutaneous Q24H  . furosemide  20 mg Oral BID  . glimepiride  4 mg Oral QAC breakfast  . insulin aspart  0-20 Units Subcutaneous TID WC  . insulin aspart  0-5 Units Subcutaneous QHS  . ipratropium-albuterol  3 mL Nebulization TID  . levofloxacin  250 mg Oral Daily  .  levothyroxine  125 mcg Oral Daily  . oxybutynin  5 mg Oral Daily  . pantoprazole  40 mg Oral Daily  . potassium chloride  10 mEq Oral BID  . predniSONE  50 mg Oral Q breakfast  . simvastatin  40 mg Oral Daily  . sodium chloride flush  3 mL Intravenous Q12H   Infusions:    Labs:  Recent Labs  10/03/15 0445 10/04/15 0337  NA 139 137  K 4.4 4.3  CL 100* 102  CO2 28 26  GLUCOSE  185* 195*  BUN 57* 73*  CREATININE 1.92* 1.80*  CALCIUM 8.4* 8.1*   No results for input(s): AST, ALT, ALKPHOS, BILITOT, PROT, ALBUMIN in the last 72 hours.  Recent Labs  10/04/15 0337  WBC 12.1*  HGB 9.5*  HCT 28.0*  MCV 93.1  PLT 223    Recent Labs  10/01/15 1224 10/01/15 1743 10/01/15 2346  TROPONINI <0.03 <0.03 <0.03   Invalid input(s): POCBNP  Recent Labs  10/01/15 1224  HGBA1C 5.6     Weights: Filed Weights   10/02/15 0557 10/03/15 0515 10/04/15 0500  Weight: 250 lb 4.8 oz (113.5 kg) 253 lb 4.8 oz (114.9 kg) 251 lb 8.7 oz (114.1 kg)     Radiology/Studies:  Ct Angio Chest Pe W Or Wo Contrast  Result Date: 10/01/2015 CLINICAL DATA:  Chest pain and short of breath EXAM: CT ANGIOGRAPHY CHEST WITH CONTRAST TECHNIQUE: Multidetector CT imaging of the chest was performed using the standard protocol during bolus administration of intravenous contrast. Multiplanar CT image reconstructions and MIPs were obtained to evaluate the vascular anatomy. CONTRAST:  60 cc Isovue 370 COMPARISON:  None. FINDINGS: There are no filling defects in the pulmonary arterial tree to suggest acute pulmonary thromboembolism There is no evidence of aortic dissection or aneurysm. Great vessels are grossly patent. There is no abnormal mediastinal adenopathy. No pericardial effusion. Three vessel coronary artery calcification. Mitral annular calcification. Mild peripheral aortic valvular calcification. Large hiatal hernia No pneumothorax or pleural effusion. 6 mm right upper pole nodule on image 17. 6 mm left lower lobe pulmonary nodule on image 52. 7 mm left lower lobe pulmonary nodule on image 52. No acute bony deformity. Review of the MIP images confirms the above findings. IMPRESSION: No evidence of acute pulmonary thromboembolism Hiatal hernia. Multiple bilateral small pulmonary nodules. The largest in the left lower lobe measures 7 mm. Non-contrast chest CT at 3-6 months is recommended. If the  nodules are stable at time of repeat CT, then future CT at 18-24 months (from today's scan) is considered optional for low-risk patients, but is recommended for high-risk patients. This recommendation follows the consensus statement: Guidelines for Management of Incidental Pulmonary Nodules Detected on CT Images:From the Fleischner Society 2017; published online before print (10.1148/radiol.0109323557). Electronically Signed   By: Marybelle Killings M.D.   On: 10/01/2015 16:09   US Venous Img Lower Bilateral  Result Date: 10/01/2015 CLINICAL DATA:  Bilateral lower extremity edema, left greater than right. Evaluate for DVT. EXAM: BILATERAL LOWER EXTREMITY VENOUS DOPPLER ULTRASOUND TECHNIQUE: Gray-scale sonography with graded compression, as well as color Doppler and duplex ultrasound were performed to evaluate the lower extremity deep venous systems from the level of the common femoral vein and including the common femoral, femoral, profunda femoral, popliteal and calf veins including the posterior tibial, peroneal and gastrocnemius veins when visible. The superficial great saphenous vein was also interrogated. Spectral Doppler was utilized to evaluate flow at rest and with distal augmentation maneuvers in the  common femoral, femoral and popliteal veins. COMPARISON:  Left lower extremity venous Doppler ultrasound - 10/12/2013 FINDINGS: Examination is degraded due to patient body habitus and poor sonographic window. RIGHT LOWER EXTREMITY Common Femoral Vein: No evidence of thrombus. Normal compressibility, respiratory phasicity and response to augmentation. Saphenofemoral Junction: No evidence of thrombus. Normal compressibility and flow on color Doppler imaging. Profunda Femoral Vein: No evidence of thrombus. Normal compressibility and flow on color Doppler imaging. Femoral Vein: No evidence of thrombus. Normal compressibility, respiratory phasicity and response to augmentation. Popliteal Vein: No evidence of thrombus.  Normal compressibility, respiratory phasicity and response to augmentation. Calf Veins: No evidence of thrombus. Normal compressibility and flow on color Doppler imaging. Superficial Great Saphenous Vein: No evidence of thrombus. Normal compressibility and flow on color Doppler imaging. Venous Reflux:  None. Other Findings:  None. LEFT LOWER EXTREMITY Common Femoral Vein: No evidence of thrombus. Normal compressibility, respiratory phasicity and response to augmentation. Saphenofemoral Junction: No evidence of thrombus. Normal compressibility and flow on color Doppler imaging. Profunda Femoral Vein: No evidence of thrombus. Normal compressibility and flow on color Doppler imaging. Femoral Vein: No evidence of thrombus. Normal compressibility, respiratory phasicity and response to augmentation. Popliteal Vein: No evidence of thrombus. Normal compressibility, respiratory phasicity and response to augmentation. Calf Veins: No evidence of thrombus. Normal compressibility and flow on color Doppler imaging. Superficial Great Saphenous Vein: No evidence of thrombus. Normal compressibility and flow on color Doppler imaging. Venous Reflux:  None. Other Findings:  None. IMPRESSION: No evidence of DVT within either lower extremity. Electronically Signed   By: Sandi Mariscal M.D.   On: 10/01/2015 15:45   Dg Chest Portable 1 View  Result Date: 10/01/2015 CLINICAL DATA:  82 year old female with lower extremity swelling for several days, shortness of breath and chest pain. Initial encounter. EXAM: PORTABLE CHEST 1 VIEW COMPARISON:  12/28/2014 and earlier. FINDINGS: Portable AP upright view at 0850 hours. Chronic hiatal hernia. Stable cardiac size and mediastinal contours. Calcified aortic atherosclerosis. Mild acute increased interstitial opacity diffusely. No pneumothorax, pleural effusion or consolidation. IMPRESSION: 1. Mild pulmonary interstitial edema suspected. No other acute cardiopulmonary abnormality. 2.  Calcified aortic  atherosclerosis. Electronically Signed   By: Genevie Ann M.D.   On: 10/01/2015 09:05     Assessment and Plan  Principal Problem:   Acute on chronic diastolic (congestive) heart failure (HCC) Active Problems:   Acute renal failure superimposed on stage 3 chronic kidney disease ([place])   New onset atrial fibrillation (HCC)   Anemia   COPD exacerbation (HCC)   Essential hypertension   Atherosclerosis of coronary artery bypass graft of native heart without angina pectoris    1. Acute on chronic diastolic CHF: -Breathing improved, still with significant nonproductive cough -Resume PO Lasix at 20 mg bid -Echo this admission with preserved EF of 55-60% with GR1DD  2. New onset rate controlled Afib: -Likely in the setting of her poor pulmonary function  -Seen on telemetry at 18:00-19:00 on 8/9 -Never previously on anticoagulation -Currently in sinus rhythm with a heart rate in the 80's bpm -CHADS2VASc at least 7 (CHF, HTN, age x 2, DM, vascular disease, female) -Uncertain if this was a one-time event or if she has been in PAF -She noted palpitations at the time she was in Afib -If hgb remains stable may need DOAC with holding of aspirin to avoid triple therapy   3. CAD: -Status post DES to LAD in 2011 -No chest pain -Troponin negative -May need DOAC as above  4.  Anemia: -Down to 9.5 this morning -She notes a long history of melena -She reports a recent colonoscopy with several polyps removed  -Recommend occult blood evaluation by IM -Hold starting anticoagulation until the above is evaluated and found to be stable  5. HTN: -Previously labile -Now improved to the 537'S to 827'M systolic -Resume PO Lasix as above -If BP remains stable resume ARB  6. Acute on CKD stage III: -Improving  7. COPD exacerbation: -Per IM  Signed, Christell Faith, PA-C [facility] Pager: [phone] 10/04/2015, 8:51 AM

## 2015-10-04 NOTE — Progress Notes (Signed)
Inpatient Diabetes Program Recommendations  AACE/ADA: New Consensus Statement on Inpatient Glycemic Control (2015)  Target Ranges:  Prepandial:   less than 140 mg/dL      Peak postprandial:   less than 180 mg/dL (1-2 hours)      Critically ill patients:  140 - 180 mg/dL   Lab Results  Component Value Date   GLUCAP 202 (H) 10/04/2015   HGBA1C 5.6 10/01/2015    Review of Glycemic Control  Results for Amy [name], Amy [name] (MRN [id number]) as of 10/04/2015 11:13  Ref. Range 10/03/2015 07:29 10/03/2015 12:07 10/03/2015 16:22 10/03/2015 21:16 10/04/2015 07:38  Glucose-Capillary Latest Ref Range: 65 - 99 mg/dL 171 (H) 189 (H) 160 (H) 303 (H) 202 (H)    Diabetes history: DM2 Outpatient Diabetes medications: Actos 15 mg daily, Metformin 1000 mg bid, Amaryl 4 mg daily Current orders for Inpatient glycemic control: Amaryl 4 mg QAM, Novolog 0-20 units TID with meals, Novolog 0-5 units QHS  Inpatient Diabetes Program Recommendations: Insulin - Meal Coverage: If steroid are continued, please consider ordering Novolog 4 units TID with meals for meal coverage if patient eats at least 50% of meals- continue Novolog correction as ordered.  Gentry Fitz, RN, BA, MHA, CDE Diabetes Coordinator Inpatient Diabetes Program  [phone] (Team Pager) [phone] ([place]) 10/04/2015 11:14 AM

## 2015-10-05 LAB — GLUCOSE, CAPILLARY
GLUCOSE-CAPILLARY: 122 mg/dL — AB (ref 65–99)
GLUCOSE-CAPILLARY: 163 mg/dL — AB (ref 65–99)
Glucose-Capillary: 385 mg/dL — ABNORMAL HIGH (ref 65–99)
Glucose-Capillary: 276 mg/dL — ABNORMAL HIGH (ref 65–99)

## 2015-10-05 LAB — BASIC METABOLIC PANEL
ANION GAP: 8 (ref 5–15)
BUN: 66 mg/dL — ABNORMAL HIGH (ref 6–20)
CO2: 27 mmol/L (ref 22–32)
Calcium: 8.1 mg/dL — ABNORMAL LOW (ref 8.9–10.3)
Chloride: 104 mmol/L (ref 101–111)
Creatinine, Ser: 1.5 mg/dL — ABNORMAL HIGH (ref 0.44–1.00)
GFR calc Af Amer: 35 mL/min — ABNORMAL LOW (ref >60)
GFR, EST NON AFRICAN AMERICAN: 30 mL/min — AB (ref >60)
GLUCOSE: 120 mg/dL — AB (ref 65–99)
POTASSIUM: 3.8 mmol/L (ref 3.5–5.1)
SODIUM: 139 mmol/L (ref 135–145)

## 2015-10-05 LAB — HEMOGLOBIN: HEMOGLOBIN: 9.7 g/dL — AB (ref 12.0–16.0)

## 2015-10-05 MED ORDER — FUROSEMIDE 40 MG PO TABS: 40.0000 mg | ORAL_TABLET | Freq: Two times a day (BID) | ORAL | Status: DC

## 2015-10-05 MED ORDER — PHENOL 1.4 % MT LIQD
1.0000 | OROMUCOSAL | Status: DC | PRN
  Filled 2015-10-05: qty 177

## 2015-10-05 MED ORDER — ISOSORBIDE MONONITRATE ER 30 MG PO TB24
30.0000 mg | ORAL_TABLET | Freq: Every day | ORAL | Status: DC
  Administered 2015-10-05 – 2015-10-07 (×3): 30 mg via ORAL
  Filled 2015-10-05 (×3): qty 1

## 2015-10-05 MED ORDER — HYDRALAZINE HCL 25 MG PO TABS
25.0000 mg | ORAL_TABLET | Freq: Three times a day (TID) | ORAL | Status: DC
  Administered 2015-10-05 – 2015-10-07 (×6): 25 mg via ORAL
  Filled 2015-10-05 (×7): qty 1

## 2015-10-05 MED ORDER — FUROSEMIDE 40 MG PO TABS
40.0000 mg | ORAL_TABLET | Freq: Every day | ORAL | Status: DC
  Administered 2015-10-05 – 2015-10-11 (×7): 40 mg via ORAL
  Filled 2015-10-05 (×7): qty 1

## 2015-10-05 MED ORDER — GABAPENTIN 100 MG PO CAPS
100.0000 mg | ORAL_CAPSULE | Freq: Three times a day (TID) | ORAL | Status: DC | PRN
  Administered 2015-10-05: 100 mg via ORAL
  Filled 2015-10-05: qty 1

## 2015-10-05 NOTE — Care Management (Signed)
Advanced delivering Rolator.

## 2015-10-05 NOTE — Progress Notes (Signed)
Patient: Amy [name] / Admit Date: 10/01/2015 / Date of Encounter: 10/05/2015, 7:54 AM   Subjective: Non-productive cough persists. UOP 3.1 L for the admission. Renal function improving.   Review of Systems: Review of Systems  Constitutional: Positive for malaise/fatigue. Negative for chills, diaphoresis, fever and weight loss.  HENT: Negative for congestion.   Eyes: Negative for discharge and redness.  Respiratory: Positive for cough, shortness of breath and wheezing. Negative for sputum production.   Cardiovascular: Negative for chest pain, palpitations, orthopnea, claudication, leg swelling and PND.  Gastrointestinal: Negative for abdominal pain, heartburn, nausea and vomiting.  Musculoskeletal: Negative for falls and myalgias.  Skin: Negative for rash.  Neurological: Positive for weakness. Negative for dizziness, tingling, tremors, sensory change, speech change, focal weakness and loss of consciousness.  Endo/Heme/Allergies: Does not bruise/bleed easily.  Psychiatric/Behavioral: Negative for substance abuse. The patient is not nervous/anxious.   All other systems reviewed and are negative.   Objective: Telemetry: sinus rhythm with PACs Physical Exam: Blood pressure (!) 167/49, pulse 77, temperature 98.3 F (36.8 C), temperature source Oral, resp. rate 18, height '5\' 2"'$  (1.575 m), weight 255 lb 8 oz (115.9 kg), SpO2 95 %. Body mass index is 46.73 kg/m. General: Well developed, well nourished, in no acute distress. Head: Normocephalic, atraumatic, sclera non-icteric, no xanthomas, nares are without discharge. Neck: Negative for carotid bruits. JVP not elevated. Lungs: Diffuse expiratory wheezing and coarse breath sounds throughout. Breathing is unlabored. Heart: RRR S1 S2 without murmurs, rubs, or gallops.  Abdomen: Soft, non-tender, non-distended with normoactive bowel sounds. No rebound/guarding. Extremities: No clubbing or cyanosis. No edema. Distal pedal pulses are 2+  and equal bilaterally. Neuro: Alert and oriented X 3. Moves all extremities spontaneously. Psych:  Responds to questions appropriately with a normal affect.   Intake/Output Summary (Last 24 hours) at 10/05/15 0754 Last data filed at 10/05/15 0505  Gross per 24 hour  Intake              960 ml  Output             1550 ml  Net             -590 ml    Inpatient Medications:  . amLODipine  10 mg Oral Daily  . aspirin  81 mg Oral Daily  . budesonide (PULMICORT) nebulizer solution  0.25 mg Nebulization BID  . chlorpheniramine-HYDROcodone  5 mL Oral Q12H  . cloNIDine  0.3 mg Transdermal Weekly  . clopidogrel  75 mg Oral Daily  . docusate sodium  100 mg Oral BID  . doxazosin  8 mg Oral Daily  . enoxaparin (LOVENOX) injection  40 mg Subcutaneous Q24H  . furosemide  20 mg Oral BID  . glimepiride  4 mg Oral QAC breakfast  . insulin aspart  0-20 Units Subcutaneous TID WC  . insulin aspart  0-5 Units Subcutaneous QHS  . ipratropium-albuterol  3 mL Nebulization Q4H  . levofloxacin  250 mg Oral Daily  . levothyroxine  125 mcg Oral Daily  . oxybutynin  5 mg Oral Daily  . pantoprazole  40 mg Oral Daily  . potassium chloride  10 mEq Oral BID  . predniSONE  50 mg Oral Q breakfast  . simvastatin  40 mg Oral Daily  . sodium chloride flush  3 mL Intravenous Q12H   Infusions:    Labs:  Recent Labs  10/04/15 0337 10/05/15 0652  NA 137 139  K 4.3 3.8  CL 102 104  CO2  26 27  GLUCOSE 195* 120*  BUN 73* 66*  CREATININE 1.80* 1.50*  CALCIUM 8.1* 8.1*  MG 2.2  --    No results for input(s): AST, ALT, ALKPHOS, BILITOT, PROT, ALBUMIN in the last 72 hours.  Recent Labs  10/04/15 0337  WBC 12.1*  HGB 9.5*  HCT 28.0*  MCV 93.1  PLT 223   No results for input(s): CKTOTAL, CKMB, TROPONINI in the last 72 hours. Invalid input(s): POCBNP No results for input(s): HGBA1C in the last 72 hours.   Weights: Filed Weights   10/03/15 0515 10/04/15 0500 10/05/15 0501  Weight: 253 lb 4.8 oz  (114.9 kg) 251 lb 8.7 oz (114.1 kg) 255 lb 8 oz (115.9 kg)     Radiology/Studies:  Ct Angio Chest Pe W Or Wo Contrast  Result Date: 10/01/2015 CLINICAL DATA:  Chest pain and short of breath EXAM: CT ANGIOGRAPHY CHEST WITH CONTRAST TECHNIQUE: Multidetector CT imaging of the chest was performed using the standard protocol during bolus administration of intravenous contrast. Multiplanar CT image reconstructions and MIPs were obtained to evaluate the vascular anatomy. CONTRAST:  60 cc Isovue 370 COMPARISON:  None. FINDINGS: There are no filling defects in the pulmonary arterial tree to suggest acute pulmonary thromboembolism There is no evidence of aortic dissection or aneurysm. Great vessels are grossly patent. There is no abnormal mediastinal adenopathy. No pericardial effusion. Three vessel coronary artery calcification. Mitral annular calcification. Mild peripheral aortic valvular calcification. Large hiatal hernia No pneumothorax or pleural effusion. 6 mm right upper pole nodule on image 17. 6 mm left lower lobe pulmonary nodule on image 52. 7 mm left lower lobe pulmonary nodule on image 52. No acute bony deformity. Review of the MIP images confirms the above findings. IMPRESSION: No evidence of acute pulmonary thromboembolism Hiatal hernia. Multiple bilateral small pulmonary nodules. The largest in the left lower lobe measures 7 mm. Non-contrast chest CT at 3-6 months is recommended. If the nodules are stable at time of repeat CT, then future CT at 18-24 months (from today's scan) is considered optional for low-risk patients, but is recommended for high-risk patients. This recommendation follows the consensus statement: Guidelines for Management of Incidental Pulmonary Nodules Detected on CT Images:From the Fleischner Society 2017; published online before print (10.1148/radiol.6967893810). Electronically Signed   By: Marybelle Killings M.D.   On: 10/01/2015 16:09   US Venous Img Lower Bilateral  Result Date:  10/01/2015 CLINICAL DATA:  Bilateral lower extremity edema, left greater than right. Evaluate for DVT. EXAM: BILATERAL LOWER EXTREMITY VENOUS DOPPLER ULTRASOUND TECHNIQUE: Gray-scale sonography with graded compression, as well as color Doppler and duplex ultrasound were performed to evaluate the lower extremity deep venous systems from the level of the common femoral vein and including the common femoral, femoral, profunda femoral, popliteal and calf veins including the posterior tibial, peroneal and gastrocnemius veins when visible. The superficial great saphenous vein was also interrogated. Spectral Doppler was utilized to evaluate flow at rest and with distal augmentation maneuvers in the common femoral, femoral and popliteal veins. COMPARISON:  Left lower extremity venous Doppler ultrasound - 10/12/2013 FINDINGS: Examination is degraded due to patient body habitus and poor sonographic window. RIGHT LOWER EXTREMITY Common Femoral Vein: No evidence of thrombus. Normal compressibility, respiratory phasicity and response to augmentation. Saphenofemoral Junction: No evidence of thrombus. Normal compressibility and flow on color Doppler imaging. Profunda Femoral Vein: No evidence of thrombus. Normal compressibility and flow on color Doppler imaging. Femoral Vein: No evidence of thrombus. Normal compressibility, respiratory phasicity and  response to augmentation. Popliteal Vein: No evidence of thrombus. Normal compressibility, respiratory phasicity and response to augmentation. Calf Veins: No evidence of thrombus. Normal compressibility and flow on color Doppler imaging. Superficial Great Saphenous Vein: No evidence of thrombus. Normal compressibility and flow on color Doppler imaging. Venous Reflux:  None. Other Findings:  None. LEFT LOWER EXTREMITY Common Femoral Vein: No evidence of thrombus. Normal compressibility, respiratory phasicity and response to augmentation. Saphenofemoral Junction: No evidence of thrombus.  Normal compressibility and flow on color Doppler imaging. Profunda Femoral Vein: No evidence of thrombus. Normal compressibility and flow on color Doppler imaging. Femoral Vein: No evidence of thrombus. Normal compressibility, respiratory phasicity and response to augmentation. Popliteal Vein: No evidence of thrombus. Normal compressibility, respiratory phasicity and response to augmentation. Calf Veins: No evidence of thrombus. Normal compressibility and flow on color Doppler imaging. Superficial Great Saphenous Vein: No evidence of thrombus. Normal compressibility and flow on color Doppler imaging. Venous Reflux:  None. Other Findings:  None. IMPRESSION: No evidence of DVT within either lower extremity. Electronically Signed   By: Sandi Mariscal M.D.   On: 10/01/2015 15:45   Dg Chest Portable 1 View  Result Date: 10/01/2015 CLINICAL DATA:  82 year old female with lower extremity swelling for several days, shortness of breath and chest pain. Initial encounter. EXAM: PORTABLE CHEST 1 VIEW COMPARISON:  12/28/2014 and earlier. FINDINGS: Portable AP upright view at 0850 hours. Chronic hiatal hernia. Stable cardiac size and mediastinal contours. Calcified aortic atherosclerosis. Mild acute increased interstitial opacity diffusely. No pneumothorax, pleural effusion or consolidation. IMPRESSION: 1. Mild pulmonary interstitial edema suspected. No other acute cardiopulmonary abnormality. 2.  Calcified aortic atherosclerosis. Electronically Signed   By: Genevie Ann M.D.   On: 10/01/2015 09:05     Assessment and Plan  Principal Problem:   Acute on chronic diastolic (congestive) heart failure (HCC) Active Problems:   Acute renal failure superimposed on stage 3 chronic kidney disease ([place])   New onset atrial fibrillation (HCC)   Anemia   COPD exacerbation (HCC)   Essential hypertension   Atherosclerosis of coronary artery bypass graft of native heart without angina pectoris    1. Acute respiratory distress: -Likely  multifactorial including COPD exacerbation and a small component of acute on chronic diastolic CHF, though suspect her main problem is COPD -Weaned to room air by IM -Cough persists  2. Acute on chronic diastolic CHF: -Breathing improved, still with significant nonproductive cough -Resume PO Lasix at 20 mg bid -Echo this admission with preserved EF of 55-60% with GR1DD -UOP 3.1 L  3. New onset rate controlled Afib: -Likely in the setting of her poor pulmonary function  -Seen on telemetry at 18:00-19:00 on 8/9, none since -Never previously on anticoagulation -Currently in sinus rhythm with a heart rate in the 80's bpm -CHADS2VASc at least 7 (CHF, HTN, age x 2, DM, vascular disease, female) -Uncertain if this was a one-time event in the setting of her poor pulmonary function or if she has been in PAF -She noted palpitations at the time she was in Afib -Hold anticoagulation at this time unless she has recurrence   4. CAD: -Status post DES to LAD in 2011 -No chest pain -Troponin negative -May need DOAC as above  5. Anemia: -Down to 9.5 on 8/10 -She notes a long history of melena -She reports a recent colonoscopy with several polyps removed  -Continue to recommend occult blood evaluation by IM along with CBC  6. HTN: -Previously labile -Now improved to the 120's to  102'T systolic -Resume PO Lasix as above -If BP remains stable resume ARB  7. Acute on CKD stage III: -Improving  8. COPD exacerbation: -Per IM  Signed, Christell Faith, PA-C [facility] Pager: [phone] 10/05/2015, 7:54 AM

## 2015-10-05 NOTE — Care Management Important Message (Signed)
Important Message  Patient Details  Name: Amy [name] MRN: [id number] Date of Birth: 02-Sep-1927   Medicare Important Message Given:  Yes    Jolly Mango, RN 10/05/2015, 8:55 AM

## 2015-10-05 NOTE — Progress Notes (Signed)
Inpatient Diabetes Program Recommendations  AACE/ADA: New Consensus Statement on Inpatient Glycemic Control (2015)  Target Ranges:  Prepandial:   less than 140 mg/dL      Peak postprandial:   less than 180 mg/dL (1-2 hours)      Critically ill patients:  140 - 180 mg/dL   Results for Amy [name], Amy [name] (MRN [id number]) as of 10/05/2015 10:02  Ref. Range 10/04/2015 07:38 10/04/2015 11:21 10/04/2015 16:32 10/04/2015 21:14 10/05/2015 07:17  Glucose-Capillary Latest Ref Range: 65 - 99 mg/dL 202 (H) 116 (H) 306 (H) 263 (H) 122 (H)  Results for Amy [name], Amy [name] (MRN [id number]) as of 10/05/2015 10:02  Ref. Range 10/03/2015 07:29 10/03/2015 12:07 10/03/2015 16:22 10/03/2015 21:16  Glucose-Capillary Latest Ref Range: 65 - 99 mg/dL 171 (H) 189 (H) 160 (H) 303 (H)   Review of Glycemic Control  Diabetes history: DM2 Outpatient Diabetes medications: Actos 15 mg daily, Metformin 1000 mg bid, Amaryl 4 mg daily Current orders for Inpatient glycemic control: Amaryl 4 mg QAM, Novolog 0-20 units TID with meals, Novolog 0-5 units QHS  Inpatient Diabetes Program Recommendations: Insulin - Meal Coverage: If steroid are continued, please consider ordering Novolog 3 units TID with meals for meal coverage if patient eats at least 50% of meals  Thanks, Barnie Alderman, RN, MSN, CDE Diabetes Coordinator Inpatient Diabetes Program [phone] (Team Pager from [place] to [place]) [phone] ([facility] office) [phone] [facility] office) [phone] [facility] office)

## 2015-10-05 NOTE — Progress Notes (Signed)
[facility] [place] NAME: Amy [name]    MR#:  [id number]  DATE OF BIRTH:  09-24-1927  SUBJECTIVE:  CHIEF COMPLAINT:   Chief Complaint  Patient presents with  . Angioedema  . Chest Pain  . Shortness of Breath  The patient is 82 year old Caucasian female with past medical history significant for history of chronic diastolic CHF who presents to the hospital with complaints of shortness of breath, dyspnea on exertion, lower extremity swelling, orthopnea, PND, wheezing, cough. Patient's chest x-ray revealed mild pulmonary edema. Lower extremity Dopplers showed no DVT bilaterally. CT angiogram of the chest showed no evidence of acute thromboembolism, hiatal hernia, multiple bilateral small pulmonary nodules, repeat the CT was recommended As outpatient. Patient feels some better today, however, still some cough, wheezing, shortness of breath. Patient was noted to be hypotensive, and in acute renal failure with creatinine of 1.92. yesterday, up from 1.29. Blood pressure medications as well as Lasix were discontinued. Creatinine has improved some, She is weaned off to room air, O2 sats were 93% The patient feels Better today, continues to wheeze, continues to cough, however, improved  Review of Systems  Constitutional: Negative for chills, fever and weight loss.  HENT: Negative for congestion.   Eyes: Negative for blurred vision and double vision.  Respiratory: Positive for cough, sputum production, shortness of breath and wheezing.   Cardiovascular: Positive for orthopnea, leg swelling and PND. Negative for chest pain and palpitations.  Gastrointestinal: Negative for abdominal pain, blood in stool, constipation, diarrhea, nausea and vomiting.  Genitourinary: Negative for dysuria, frequency, hematuria and urgency.  Musculoskeletal: Negative for falls.  Neurological: Positive for weakness. Negative for dizziness, tremors, focal weakness and  headaches.  Endo/Heme/Allergies: Does not bruise/bleed easily.  Psychiatric/Behavioral: Negative for depression. The patient does not have insomnia.     VITAL SIGNS: Blood pressure (!) 144/41, pulse 74, temperature 98.3 F (36.8 C), temperature source Oral, resp. rate 15, height '5\' 2"'$  (1.575 m), weight 115.9 kg (255 lb 8 oz), SpO2 93 %.  PHYSICAL EXAMINATION:   GENERAL:  82 y.o.-year-old patient lying in the bed in mild respiratory distress, remains somewhat tachypneic, especially when tried to to talk, significant coughing, but overall more comfortable today EYES: Pupils equal, round, reactive to light and accommodation. No scleral icterus. Extraocular muscles intact.  HEENT: Head atraumatic, normocephalic. Oropharynx and nasopharynx clear.  NECK:  Supple, no jugular venous distention. No thyroid enlargement, no tenderness.  LUNGS: Better air entrance  bilaterally, still scattered bilateral wheezing, no rales,rhonchi , crepitations . Intermittently using accessory muscles of respiration, especially with cough .  CARDIOVASCULAR: S1, S2, rhythm is regular. No murmurs, rubs, or gallops.  ABDOMEN: Soft, nontender, nondistended. Bowel sounds present. No organomegaly or mass.  EXTREMITIES: 1 to 2 + lower extremity and pedal edema, no cyanosis, or clubbing.  NEUROLOGIC: Cranial nerves II through XII are intact. Muscle strength 5/5 in all extremities. Sensation intact. Gait not checked.  PSYCHIATRIC: The patient is alert and oriented x 3.  SKIN: No obvious rash, lesion, or ulcer.   ORDERS/RESULTS REVIEWED:   CBC  Recent Labs Lab 10/01/15 0847 10/04/15 0337 10/05/15 0817  WBC 9.0 12.1*  --   HGB 10.4* 9.5* 9.7*  HCT 30.8* 28.0*  --   PLT 195 223  --   MCV 92.9 93.1  --   MCH 31.5 31.4  --   MCHC 33.9 33.8  --   RDW 18.3* 18.1*  --    ------------------------------------------------------------------------------------------------------------------  Chemistries   Recent Labs Lab  10/01/15 0847 10/02/15 0737 10/03/15 0445 10/04/15 0337 10/05/15 0652  NA 139 141 139 137 139  K 4.6 4.2 4.4 4.3 3.8  CL 107 103 100* 102 104  CO2 '25 28 28 26 27  '$ GLUCOSE 201* 126* 185* 195* 120*  BUN 30* 33* 57* 73* 66*  CREATININE 1.27* 1.29* 1.92* 1.80* 1.50*  CALCIUM 8.8* 8.9 8.4* 8.1* 8.1*  MG  --   --   --  2.2  --   AST 18  --   --   --   --   ALT 14  --   --   --   --   ALKPHOS 81  --   --   --   --   BILITOT 0.3  --   --   --   --    ------------------------------------------------------------------------------------------------------------------ estimated creatinine clearance is 32.5 mL/min (by C-G formula based on SCr of 1.5 mg/dL). ------------------------------------------------------------------------------------------------------------------ No results for input(s): TSH, T4TOTAL, T3FREE, THYROIDAB in the last 72 hours.  Invalid input(s): FREET3  Cardiac Enzymes  Recent Labs Lab 10/01/15 1224 10/01/15 1743 10/01/15 2346  TROPONINI <0.03 <0.03 <0.03   ------------------------------------------------------------------------------------------------------------------ Invalid input(s): POCBNP ---------------------------------------------------------------------------------------------------------------  RADIOLOGY: No results found.  EKG:  Orders placed or performed during the hospital encounter of 10/01/15  . ED EKG  . ED EKG    ASSESSMENT AND PLAN:  Principal Problem:   Acute on chronic diastolic (congestive) heart failure (HCC) Active Problems:   Essential hypertension   Acute renal failure superimposed on stage 3 chronic kidney disease (HCC)   Atherosclerosis of coronary artery bypass graft of native heart without angina pectoris   New onset atrial fibrillation (HCC)   Anemia   COPD exacerbation ([place]) #1. Acute on chronic respiratory failure with hypoxia due to acute on chronic diastolic CHF and Most likely COPD exacerbation, Weaned off oxygen  therapy, now on room air , improved, resume diuretic #2. Acute on chronic diastolic CHF, resumed diuresis, as kidney function improved, off blood pressure medications due to hypotension, urinary output remains good, patient is negative of  approximately 3.4 L since admission. Continue hydralazine/Imdur #3. COPD exacerbation, continue  Levaquin, DuoNeb nebs, steroids, continue budesonide, Tussionex, and Humibid, improved clinically, weaned off oxygen therapy, continues to have significant wheezing and cough, continue steroids at current doses #4 acute bronchitis, continue levofloxacin, unable to get sputum cultures due to  dry cough #5. Lower extremity swelling, no DVT, likely right-sided heart failure, continue Lasix, watching creatinine closely #6 . Acute on chronic renal failure, underlying CK D stage III, worsened with diuresis, now  improved creatinine , follow in the morning since diuresis is resumed #7. Diabetes mellitus type 2. Hemoglobin A1c was 5.6, continue sliding scale insulin, watch for hypoglycemia episodes #8. Hypotension, blood pressure medications are being discontinued, except hydralazine and Imdur, the patient was on low rate IV fluids, blood pressure has improved, as well as kidney function, follow closely  Management plans discussed with the patient, family and they are in agreement.   DRUG ALLERGIES:  Allergies  Allergen Reactions  . Atenolol     Other reaction(s): Other (See Comments) Decreased heart rate  . Codeine     Rash, difficulty breathing, nausea.    CODE STATUS:     Code Status Orders        Start     Ordered   10/01/15 1145  Full code  Continuous     10/01/15 1144    Code  Status History    Date Active Date Inactive Code Status Order ID Comments User Context   10/01/2015 11:44 AM 10/02/2015  7:48 AM Full Code [id number]  Bettey Costa, MD Inpatient    Advance Directive Documentation   Flowsheet Row Most Recent Value  Type of Advance Directive  Healthcare  Power of Attorney  Pre-existing out of facility DNR order (yellow form or pink MOST form)  No data  "MOST" Form in Place?  No data      TOTAL TIME TAKING CARE OF THIS PATIENT: 35 minutes.    Theodoro Grist M.D on 10/05/2015 at 11:38 AM  Between 7am to 6pm - Pager - [phone]  After 6pm go to [email or web address] - password [email or web address] [facility]  [facility]  Office  [phone]  CC: Primary care physician; Leonel Ramsay, MD

## 2015-10-05 NOTE — Progress Notes (Signed)
During respiratory treatment RN informed that pt felt as if her feet were being stuck be needles. MD made aware and new order for '100mg'$  Gabapentin TID PRN placed.

## 2015-10-05 NOTE — Progress Notes (Signed)
Physical Therapy Treatment Patient Details Name: Amy [name] MRN: [id number] DOB: 1927/06/09 Today's Date: 10/05/2015    History of Present Illness Pt is an 82 year old female with diastolic heart failure and preserved ejection fraction, hypothyroidism and diabetes who presents with acute on chronic hypoxic respiratory failure.    PT Comments    Pt in chair reports feeling better and ready to try to ambulate today.  O2 at rest 96% on room air.  Pt stood with min a x 1.  She saw quickly due to left knee feeling weak and wanting to buckle.  On second attempt she was able to stand with min a x 1.  She was able to march in place x 10 with small step height but was fatigued with task.  O2 decreased to 91%.    Reading on left hand are significantly higher than right which read at 82%.  She was able to remain standing for an additional minute before needing to sit but was unable to ambulate due to weakness and SOB.  Participated in seated exercises then was able to stand for an additional minute with standing tx x 1.  HR remained in 70's-80's during session without palpitations or pain.  Pt has had general decline in mobility and assistance levels since admission.  Initially recommendation was made for HHPT but she may require SNF for rehab unless significant improvement in mobility occurs prior to discharge due to weakness, cardio-pulmonary status.     Follow Up Recommendations  SNF     Equipment Recommendations  Rolling walker with 5" wheels    Recommendations for Other Services       Precautions / Restrictions Precautions Precautions: Fall Restrictions Weight Bearing Restrictions: No    Mobility  Bed Mobility                  Transfers Overall transfer level: Needs assistance Equipment used: Rolling walker (2 wheeled) Transfers: Sit to/from Stand Sit to Stand: Min assist         General transfer comment: in chair  Ambulation/Gait             General Gait  Details: deferred due to safety as Left knee felt unsteady and was fatigued with standing activity   Stairs            Wheelchair Mobility    Modified Rankin (Stroke Patients Only)       Balance Overall balance assessment: Needs assistance Sitting-balance support: Bilateral upper extremity supported Sitting balance-Leahy Scale: Fair                              Cognition Arousal/Alertness: Awake/alert Behavior During Therapy: WFL for tasks assessed/performed Overall Cognitive Status: Within Functional Limits for tasks assessed                      Exercises Other Exercises Other Exercises: Pt participated in marching in place 10 x 2, seated ankle pumps, LAQ's x 10    General Comments        Pertinent Vitals/Pain Pain Assessment: No/denies pain    Home Living                      Prior Function            PT Goals (current goals can now be found in the care plan section) Progress towards PT goals: Not progressing toward goals -  comment (General decline in mobility since eval )    Frequency  Min 2X/week    PT Plan Discharge plan needs to be updated    Co-evaluation             End of Session   Activity Tolerance: Patient limited by fatigue Patient left: in chair     Time: 1010-1022 PT Time Calculation (min) (ACUTE ONLY): 12 min  Charges:  $Therapeutic Exercise: 8-22 mins                    G Codes:      [name] Oct 16, 2015, 10:58 AM

## 2015-10-06 LAB — GLUCOSE, CAPILLARY
GLUCOSE-CAPILLARY: 52 mg/dL — AB (ref 65–99)
Glucose-Capillary: 55 mg/dL — ABNORMAL LOW (ref 65–99)
Glucose-Capillary: 171 mg/dL — ABNORMAL HIGH (ref 65–99)
Glucose-Capillary: 202 mg/dL — ABNORMAL HIGH (ref 65–99)
Glucose-Capillary: 420 mg/dL — ABNORMAL HIGH (ref 65–99)
Glucose-Capillary: 314 mg/dL — ABNORMAL HIGH (ref 65–99)

## 2015-10-06 LAB — BASIC METABOLIC PANEL
Anion gap: 10 (ref 5–15)
BUN: 63 mg/dL — AB (ref 6–20)
CALCIUM: 8.4 mg/dL — AB (ref 8.9–10.3)
CO2: 29 mmol/L (ref 22–32)
CREATININE: 1.59 mg/dL — AB (ref 0.44–1.00)
Chloride: 102 mmol/L (ref 101–111)
GFR calc Af Amer: 33 mL/min — ABNORMAL LOW (ref >60)
GFR, EST NON AFRICAN AMERICAN: 28 mL/min — AB (ref >60)
Glucose, Bld: 75 mg/dL (ref 65–99)
POTASSIUM: 4.1 mmol/L (ref 3.5–5.1)
Sodium: 141 mmol/L (ref 135–145)

## 2015-10-06 MED ORDER — METHYLPREDNISOLONE SODIUM SUCC 40 MG IJ SOLR
40.0000 mg | Freq: Four times a day (QID) | INTRAMUSCULAR | Status: DC
  Administered 2015-10-06 – 2015-10-09 (×13): 40 mg via INTRAVENOUS
  Filled 2015-10-06 (×13): qty 1

## 2015-10-06 NOTE — Progress Notes (Signed)
    Subjective:  Still complaining of cough and shortness of breath. Marked dyspnea with any activity. More comfortable at rest. No chest pain but describes "tightness"  Objective:  Vital Signs in the last 24 hours: Temp:  [97.6 F (36.4 C)-98.4 F (36.9 C)] 97.6 F (36.4 C) (08/12 1217) Pulse Rate:  [72-80] 74 (08/12 1217) Resp:  [15-22] 22 (08/12 1217) BP: (130-155)/(41-46) 137/46 (08/12 1217) SpO2:  [92 %-100 %] 92 % (08/12 1217) Weight:  [116.8 kg (257 lb 9.6 oz)] 116.8 kg (257 lb 9.6 oz) (08/12 0550)  Intake/Output from previous day: 08/11 0701 - 08/12 0700 In: 360 [P.O.:360] Out: 1425 [Urine:1425]  Physical Exam: Pt is alert and oriented, pleasant obese woman in NAD HEENT: normal Neck: JVP - normal Lungs: Diffuse rhonchi and wheezing bilaterally CV: RRR without murmur or gallop Abd: soft, NT, Positive BS, no hepatomegaly Ext: no edema Skin: warm/dry no rash  Lab Results:  Recent Labs  10/04/15 0337 10/05/15 0817  WBC 12.1*  --   HGB 9.5* 9.7*  PLT 223  --     Recent Labs  10/05/15 0652 10/06/15 0506  NA 139 141  K 3.8 4.1  CL 104 102  CO2 27 29  GLUCOSE 120* 75  BUN 66* 63*  CREATININE 1.50* 1.59*   No results for input(s): TROPONINI in the last 72 hours.  Invalid input(s): CK, MB  Cardiac Studies: 2D Echo 10-02-2015: Study Conclusions  - Procedure narrative: Transthoracic echocardiography. The study   was technically difficult, as a result of restricted patient   mobility. - Left ventricle: The cavity size was normal. Wall thickness was   increased in a pattern of moderate LVH. Systolic function was   normal. The estimated ejection fraction was in the range of 55%   to 60%. Doppler parameters are consistent with abnormal left   ventricular relaxation (grade 1 diastolic dysfunction). - Mitral valve: Severely calcified annulus. - Left atrium: The atrium was mildly dilated.  Tele: Personally reviewed: Normal sinus  rhythm  Assessment/Plan:  1. Acute on chronic respiratory failure: Based on her lung exam I suspect this is primarily a pulmonary issue, likely exacerbated by acute on chronic diastolic heart failure. Medication adjustments per the internal medicine team as outlined.  2. Acute on chronic diastolic heart failure: Echo shows preserved LVEF 55-60%. The patient is on oral furosemide. He does not appear volume overloaded on exam.  3. Paroxysmal atrial fibrillation: The patient has a high CHADS-Vasc score as outlined by Dr. Fletcher Anon. However, only a single episode lasting less than one hour was noted while she was in acute respiratory distress. If no recurrence I agree that anticoagulation would not be indicated at this time.  4. Hypertension: Blood pressure readings reviewed and well-controlled. Antihypertensive medical regimen reviewed. Will continue current medications.  Overall she appears stable from a cardiac perspective. She will continue on treatment for acute pulmonary issues. Creatinine has stabilized in range of 1.5-1.6 mg/dL.   [name], M.D. 10/06/2015, 1:08 PM

## 2015-10-06 NOTE — NC FL2 (Signed)
[place] LEVEL OF CARE SCREENING TOOL     IDENTIFICATION  Patient Name: Amy [name] Birthdate: 1927-06-10 Sex: female Admission Date (Current Location): 10/01/2015  [place] and [place] Number:  Engineering geologist and Address:  [facility], [address] [place], [place] [address]      Provider Number: [id number]  Attending Physician Name and Address:  Henreitta Leber, MD  Relative Name and Phone Number:       Current Level of Care: Hospital Recommended Level of Care: [place] Prior Approval Number:    Date Approved/Denied:   PASRR Number:  ( [id number] )  Discharge Plan: SNF    Current Diagnoses: Patient Active Problem List   Diagnosis Date Noted  . New onset atrial fibrillation ([place]) 10/04/2015  . Anemia 10/04/2015  . COPD exacerbation ([place]) 10/04/2015  . Acute renal failure superimposed on stage 3 chronic kidney disease ([place]) 10/03/2015  . Atherosclerosis of coronary artery bypass graft of native heart without angina pectoris   . Acute on chronic diastolic (congestive) heart failure ([place]) 10/01/2015  . Advanced directives, counseling/discussion 06/05/2015  . Morbid obesity ([place]) 12/05/2014  . Angina pectoris associated with type 2 diabetes mellitus ([place]) 12/05/2014  . Diabetes type 2, uncontrolled ([place]) 12/05/2014  . Chronic diastolic CHF (congestive heart failure) ([place]) 09/06/2013  . Preop cardiovascular exam 06/03/2012  . Iron deficiency anemia 09/08/2011  . DYSPNEA 01/21/2010  . Hyperlipidemia 07/18/2009  . CAD, NATIVE VESSEL 07/18/2009  . HYPOTHYROIDISM-IATROGENIC 07/12/2009  . Essential hypertension 07/12/2009    Orientation RESPIRATION BLADDER Height & Weight     Self, Time, Situation, Place  Normal Incontinent Weight: 257 lb 9.6 oz (116.8 kg) Height:  '5\' 2"'$  (157.5 cm)  BEHAVIORAL SYMPTOMS/MOOD NEUROLOGICAL BOWEL NUTRITION STATUS   (none )  (none ) Continent Diet (Diet: Heart Healthy/  Carb Modified )  AMBULATORY STATUS COMMUNICATION OF NEEDS Skin   Extensive Assist Verbally Normal                       Personal Care Assistance Level of Assistance  Bathing, Feeding, Dressing Bathing Assistance: Limited assistance Feeding assistance: Independent Dressing Assistance: Limited assistance     Functional Limitations Info  Sight, Hearing, Speech Sight Info: Adequate Hearing Info: Adequate Speech Info: Adequate    SPECIAL CARE FACTORS FREQUENCY  PT (By licensed PT)     PT Frequency:  (5)              Contractures      Additional Factors Info  Code Status, Allergies, Insulin Sliding Scale Code Status Info:  (Full Code. ) Allergies Info:  (Atenolol, Codeine)   Insulin Sliding Scale Info:  (NovoLog Insulin Injections 3 times per day. )       Current Medications (10/06/2015):  This is the current hospital active medication list Current Facility-Administered Medications  Medication Dose Route Frequency Provider Last Rate Last Dose  . 0.9 %  sodium chloride infusion  250 mL Intravenous PRN Bettey Costa, MD      . acetaminophen (TYLENOL) tablet 650 mg  650 mg Oral Q6H PRN Bettey Costa, MD       Or  . acetaminophen (TYLENOL) suppository 650 mg  650 mg Rectal Q6H PRN Bettey Costa, MD      . aspirin chewable tablet 81 mg  81 mg Oral Daily Bettey Costa, MD   81 mg at 10/06/15 0819  . budesonide (PULMICORT) nebulizer solution 0.25 mg  0.25 mg  Nebulization BID Theodoro Grist, MD   0.25 mg at 10/06/15 0832  . chlorpheniramine-HYDROcodone (TUSSIONEX) 10-8 MG/5ML suspension 5 mL  5 mL Oral Q12H Theodoro Grist, MD   5 mL at 10/06/15 0820  . cloNIDine (CATAPRES - Dosed in mg/24 hr) patch 0.3 mg  0.3 mg Transdermal Weekly Sital Mody, MD      . clopidogrel (PLAVIX) tablet 75 mg  75 mg Oral Daily Bettey Costa, MD   75 mg at 10/06/15 0819  . codeine tablet 15 mg  15 mg Oral Q4H PRN Theodoro Grist, MD      . docusate sodium (COLACE) capsule 100 mg  100 mg Oral BID Theodoro Grist, MD    100 mg at 10/06/15 0820  . doxazosin (CARDURA) tablet 8 mg  8 mg Oral Daily Bettey Costa, MD   8 mg at 10/05/15 1658  . enoxaparin (LOVENOX) injection 40 mg  40 mg Subcutaneous Q24H Theodoro Grist, MD   40 mg at 10/06/15 0820  . furosemide (LASIX) tablet 40 mg  40 mg Oral Daily Wellington Hampshire, MD   40 mg at 10/06/15 0820  . gabapentin (NEURONTIN) capsule 100 mg  100 mg Oral TID PRN Theodoro Grist, MD   100 mg at 10/05/15 1255  . glimepiride (AMARYL) tablet 4 mg  4 mg Oral QAC breakfast Bettey Costa, MD   4 mg at 10/06/15 0820  . hydrALAZINE (APRESOLINE) tablet 25 mg  25 mg Oral Q8H Theodoro Grist, MD   25 mg at 10/06/15 0540  . insulin aspart (novoLOG) injection 0-20 Units  0-20 Units Subcutaneous TID WC Bettey Costa, MD   20 Units at 10/05/15 1658  . insulin aspart (novoLOG) injection 0-5 Units  0-5 Units Subcutaneous QHS Bettey Costa, MD   3 Units at 10/05/15 2108  . ipratropium-albuterol (DUONEB) 0.5-2.5 (3) MG/3ML nebulizer solution 3 mL  3 mL Nebulization Q4H Theodoro Grist, MD   3 mL at 10/06/15 1116  . isosorbide mononitrate (IMDUR) 24 hr tablet 30 mg  30 mg Oral Daily Theodoro Grist, MD   30 mg at 10/06/15 0819  . levofloxacin (LEVAQUIN) tablet 250 mg  250 mg Oral Daily Theodoro Grist, MD   250 mg at 10/06/15 0819  . levothyroxine (SYNTHROID, LEVOTHROID) tablet 125 mcg  125 mcg Oral Daily Bettey Costa, MD   125 mcg at 10/06/15 0540  . methylPREDNISolone sodium succinate (SOLU-MEDROL) 40 mg/mL injection 40 mg  40 mg Intravenous Q6H Henreitta Leber, MD      . ondansetron (ZOFRAN) tablet 4 mg  4 mg Oral Q6H PRN Bettey Costa, MD       Or  . ondansetron (ZOFRAN) injection 4 mg  4 mg Intravenous Q6H PRN Bettey Costa, MD      . oxybutynin (DITROPAN) tablet 5 mg  5 mg Oral Daily Bettey Costa, MD   5 mg at 10/06/15 0820  . pantoprazole (PROTONIX) EC tablet 40 mg  40 mg Oral Daily Bettey Costa, MD   40 mg at 10/06/15 0819  . phenol (CHLORASEPTIC) mouth spray 1 spray  1 spray Mouth/Throat PRN Sital Mody, MD      .  potassium chloride (K-DUR,KLOR-CON) CR tablet 10 mEq  10 mEq Oral BID Areta Haber Dunn, PA-C   10 mEq at 10/06/15 0819  . senna (SENOKOT) tablet 8.6 mg  1 tablet Oral Daily PRN Theodoro Grist, MD   8.6 mg at 10/03/15 1052  . simvastatin (ZOCOR) tablet 40 mg  40 mg Oral Daily Bettey Costa, MD  40 mg at 10/06/15 0819  . sodium chloride flush (NS) 0.9 % injection 3 mL  3 mL Intravenous Q12H Sital Mody, MD   3 mL at 10/06/15 1000  . sodium chloride flush (NS) 0.9 % injection 3 mL  3 mL Intravenous PRN Bettey Costa, MD      . traMADol (ULTRAM) tablet 50 mg  50 mg Oral Q6H PRN Bettey Costa, MD         Discharge Medications: Please see discharge summary for a list of discharge medications.  Relevant Imaging Results:  Relevant Lab Results:   Additional Information  (SSN: [id number])  Khi Mcmillen, Veronia Beets, LCSW

## 2015-10-06 NOTE — Progress Notes (Signed)
MD made aware that pts cbg is 420, RN told to just give pt 20units from sliding scale. No new orders placed at this time.

## 2015-10-06 NOTE — Progress Notes (Signed)
[place] [facility] [place] NAME: Amy [name]    MR#:  [id number]  DATE OF BIRTH:  11-25-1927  SUBJECTIVE:   Pt. Here due to shortness of breath.  Still having some wheezing/bronchospasm today.  + cough but non-productive.  Family at bedside.    REVIEW OF SYSTEMS:    Review of Systems  Constitutional: Negative for chills and fever.  HENT: Negative for congestion and tinnitus.   Eyes: Negative for blurred vision and double vision.  Respiratory: Positive for cough, shortness of breath and wheezing.   Cardiovascular: Negative for chest pain, orthopnea and PND.  Gastrointestinal: Negative for abdominal pain, diarrhea, nausea and vomiting.  Genitourinary: Negative for dysuria and hematuria.  Neurological: Negative for dizziness, sensory change and focal weakness.  All other systems reviewed and are negative.   Nutrition: Heart healthy/Carb control Tolerating Diet: Yes Tolerating PT: Eval noted.    DRUG ALLERGIES:   Allergies  Allergen Reactions  . Atenolol     Other reaction(s): Other (See Comments) Decreased heart rate  . Codeine     Rash, difficulty breathing, nausea.    VITALS:  Blood pressure (!) 137/46, pulse 74, temperature 97.6 F (36.4 C), temperature source Oral, resp. rate (!) 22, height '5\' 2"'$  (1.575 m), weight 116.8 kg (257 lb 9.6 oz), SpO2 92 %.  PHYSICAL EXAMINATION:   Physical Exam  GENERAL:  82 y.o.-year-old patient sitting up in chair in mild resp. Distress.   EYES: Pupils equal, round, reactive to light and accommodation. No scleral icterus. Extraocular muscles intact.  HEENT: Head atraumatic, normocephalic. Oropharynx and nasopharynx clear.  NECK:  Supple, no jugular venous distention. No thyroid enlargement, no tenderness.  LUNGS: Diffuse insp & exp. Wheezing b/l.  Good A/E b/l.   No rales, rhonchi.    CARDIOVASCULAR: S1, S2 normal. No murmurs, rubs, or gallops.  ABDOMEN: Soft, nontender, nondistended. Bowel  sounds present. No organomegaly or mass.  EXTREMITIES: No cyanosis, clubbing or edema b/l.    NEUROLOGIC: Cranial nerves II through XII are intact. No focal Motor or sensory deficits b/l.   PSYCHIATRIC: The patient is alert and oriented x 3.  SKIN: No obvious rash, lesion, or ulcer.    LABORATORY PANEL:   CBC  Recent Labs Lab 10/04/15 0337 10/05/15 0817  WBC 12.1*  --   HGB 9.5* 9.7*  HCT 28.0*  --   PLT 223  --    ------------------------------------------------------------------------------------------------------------------  Chemistries   Recent Labs Lab 10/01/15 0847  10/04/15 0337  10/06/15 0506  NA 139  < > 137  < > 141  K 4.6  < > 4.3  < > 4.1  CL 107  < > 102  < > 102  CO2 25  < > 26  < > 29  GLUCOSE 201*  < > 195*  < > 75  BUN 30*  < > 73*  < > 63*  CREATININE 1.27*  < > 1.80*  < > 1.59*  CALCIUM 8.8*  < > 8.1*  < > 8.4*  MG  --   --  2.2  --   --   AST 18  --   --   --   --   ALT 14  --   --   --   --   ALKPHOS 81  --   --   --   --   BILITOT 0.3  --   --   --   --   < > =  values in this interval not displayed. ------------------------------------------------------------------------------------------------------------------  Cardiac Enzymes  Recent Labs Lab 10/01/15 2346  TROPONINI <0.03   ------------------------------------------------------------------------------------------------------------------  RADIOLOGY:  No results found.   ASSESSMENT AND PLAN:   82 year old female with past medical history of diabetes, chronic kidney disease stage III, chronic diastolic CHF, COPD, hypothyroidism, essential hypertension who presented to the hospital due to shortness of breath.  1. Acute on chronic respiratory failure with hypoxia-secondary to COPD exacerbation. -Continue IV steroids which I will further escalate giving her worsening wheezing and bronchospasm, continue scheduled DuoNeb's, Pulmicort nebs. -Wean off oxygen as tolerated.  2. COPD  exacerbation-continue IV steroids, scheduled DuoNeb's, Pulmicort nebs, Tussionex -Continue empiric Levaquin. Chest x-ray showing no evidence of acute pneumonia. Likely underlying bronchitis.  3. Acute bronchitis-continue Levaquin, treated for COPD as mentioned above.   4. Chronic diastolic CHF-clinically patient is not congestive heart failure. -Appreciate cardiology input and continue home dose Lasix, Clonidine.   4. Essential HTN - cont. Clonidine Patch, Hydralazine.  - will resume Norvasc, Losartan/HCTZ in 1-2 days.   5. DM - cont. SSI.  - hold Metformin.  Cont. Amaryl.  Cont. Carb control diet.  - follow BS closely as she is IV steroid.   6. Hypothyroidism - cont. Synthroid.   7. Urinary Incontinence - cont. Oxybutynin.   8. Hyperlipidemia - cont. Simvastatin.    All the records are reviewed and case discussed with Care Management/Social Workerr. Management plans discussed with the patient, family and they are in agreement.  CODE STATUS: Full Code  DVT Prophylaxis: Lovenox  TOTAL TIME TAKING CARE OF THIS PATIENT: 30 minutes.   POSSIBLE D/C IN 1-2 DAYS, DEPENDING ON CLINICAL CONDITION.   Henreitta Leber M.D on 10/06/2015 at 2:18 PM  Between 7am to 6pm - Pager - [phone]  After 6pm go to [email or web address] - password [email or web address] [facility]  [facility]  Office  [phone]  CC: Primary care physician; Leonel Ramsay, MD

## 2015-10-06 NOTE — Clinical Social Work Placement (Addendum)
   CLINICAL SOCIAL WORK PLACEMENT  NOTE  Date:  10/06/2015  Patient Details  Name: Amy [name] MRN: [id number] Date of Birth: January 21, 1928  Clinical Social Work is seeking post-discharge placement for this patient at the [place] level of care (*CSW will initial, date and re-position this form in  chart as items are completed):  Yes   Patient/family provided with [facility] Work Department's list of facilities offering this level of care within the geographic area requested by the patient (or if unable, by the patient's family).  Yes   Patient/family informed of their freedom to choose among providers that offer the needed level of care, that participate in Medicare, Medicaid or managed care program needed by the patient, have an available bed and are willing to accept the patient.  Yes   Patient/family informed of [facility]'s ownership interest in [facility] and [facility], as well as of the fact that they are under no obligation to receive care at these facilities.  PASRR submitted to EDS on       PASRR number received on       Existing PASRR number confirmed on 10/06/15     FL2 transmitted to all facilities in geographic area requested by pt/family on 10/06/15     FL2 transmitted to all facilities within larger geographic area on       Patient informed that his/her managed care company has contracts with or will negotiate with certain facilities, including the following:         10/07/15   Patient/family informed of bed offers received. (updated Evette Cristal, MSW, 10-11-15)  Patient chooses bed at  [facility] (updated Evette Cristal, MSW, 10-11-15)     Physician recommends and patient chooses bed at      Patient to be transferred to  [facility] on  10-11-15 (updated Evette Cristal, MSW, 10-11-15).  Patient to be transferred to facility by  [facility] (updated Evette Cristal, MSW, 10-11-15)     Patient family  notified on  10-11-15 of transfer (updated Evette Cristal, MSW, 10-11-15).  Name of family member notified:   Koree Staheli [phone] (updated Evette Cristal, MSW, 10-11-15)     PHYSICIAN       Additional Comment:    _______________________________________________ Sample, Veronia Beets, LCSW 10/06/2015, 12:23 PM   Jones Broom. Norval Morton, MSW [phone]  Mon-Fri 8a-4:30p 10/11/2015 11:53 AM (updated Evette Cristal, MSW, 10-11-15)

## 2015-10-06 NOTE — Clinical Social Work Note (Signed)
Clinical Social Work Assessment  Patient Details  Name: Amy [name] MRN: [id number] Date of Birth: August 13, 1927  Date of referral:  10/06/15               Reason for consult:  Facility Placement                Permission sought to share information with:  Amy [name] granted to share information::  Yes, Verbal Permission Granted  Name::      Amy [name]::   Amy [name]   Relationship::     Contact Information:     Housing/Transportation Living arrangements for the past 2 months:  [place] of Information:  Patient Patient Interpreter Needed:  None Criminal Activity/Legal Involvement Pertinent to Current Situation/Hospitalization:  No - Comment as needed Significant Relationships:  Adult Children Lives with:  Self Do you feel safe going back to the place where you live?  Yes Need for family participation in patient care:  No (Coment)  Care giving concerns:  Patient lives in [place] alone.    Social Worker assessment / plan: [place] representative (CSW) reviewed chart and noted that PT has changed recommendation from home health to SNF. Per MD patient prefers to go home and will likely be ready for D/C Monday or Tuesday. Per MD patient will likely need a PT re-eval on Monday. CSW met with patient alone at bedside. CSW introduced self and explained role of CSW department. Patient was alert and oriented and was sitting up in the chair. Patient reported that she lives in [place] near [address] close to [employer] Patient reported that she has 1 adult son Amy [name] that lives in [place]. CSW explained SNF process and that patient's Medicare will pay for days 1-20 at 100% and days 21-100 at 80%. Patient has met the 3 night inpatient stay criteria and was admitted to inpatient on 10/01/15. Patient is agreeable to SNF search and prefers [name]. Patient ideally would like to return home if she  is able to.  FL2 complete and faxed out. CSW will continue to follow and assist as needed.    Employment status:  Retired Forensic scientist:  Medicare PT Recommendations:  [place] / Referral to community resources:  [place]  Patient/Family's Response to care:  Patient is agreeable to [facility].   Patient/Family's Understanding of and Emotional Response to Diagnosis, Current Treatment, and Prognosis:  Patient was pleasant throughout assessment. Patient reported that she would like to go home but is willing to do what is best for her health. Patient would like for PT to see her on Monday to determine if she can return home.   Emotional Assessment Appearance:  Appears stated age Attitude/Demeanor/Rapport:    Affect (typically observed):  Accepting, Adaptable, Pleasant Orientation:  Oriented to Self, Oriented to Place, Oriented to  Time, Oriented to Situation Alcohol / Substance use:  Not Applicable Psych involvement (Current and /or in the community):  No (Comment)  Discharge Needs  Concerns to be addressed:  Discharge Planning Concerns Readmission within the last 30 days:  No Current discharge risk:  Dependent with Mobility, Chronically ill Barriers to Discharge:  Continued Medical Work up   [employer], Veronia Beets, LCSW 10/06/2015, 12:29 PM

## 2015-10-07 LAB — BASIC METABOLIC PANEL
Anion gap: 9 (ref 5–15)
BUN: 61 mg/dL — AB (ref 6–20)
CHLORIDE: 103 mmol/L (ref 101–111)
CO2: 27 mmol/L (ref 22–32)
CREATININE: 1.53 mg/dL — AB (ref 0.44–1.00)
Calcium: 8.4 mg/dL — ABNORMAL LOW (ref 8.9–10.3)
GFR calc Af Amer: 34 mL/min — ABNORMAL LOW (ref >60)
GFR calc non Af Amer: 30 mL/min — ABNORMAL LOW (ref >60)
GLUCOSE: 266 mg/dL — AB (ref 65–99)
POTASSIUM: 4.8 mmol/L (ref 3.5–5.1)
SODIUM: 139 mmol/L (ref 135–145)

## 2015-10-07 LAB — GLUCOSE, CAPILLARY
GLUCOSE-CAPILLARY: 272 mg/dL — AB (ref 65–99)
Glucose-Capillary: 319 mg/dL — ABNORMAL HIGH (ref 65–99)
Glucose-Capillary: 229 mg/dL — ABNORMAL HIGH (ref 65–99)
Glucose-Capillary: 339 mg/dL — ABNORMAL HIGH (ref 65–99)

## 2015-10-07 MED ORDER — LOSARTAN POTASSIUM-HCTZ 100-25 MG PO TABS: 1.0000 | ORAL_TABLET | Freq: Every day | ORAL | Status: DC

## 2015-10-07 MED ORDER — AMLODIPINE BESYLATE 5 MG PO TABS
5.0000 mg | ORAL_TABLET | Freq: Every day | ORAL | Status: DC
  Administered 2015-10-07: 5 mg via ORAL
  Filled 2015-10-07: qty 1

## 2015-10-07 MED ORDER — AMLODIPINE BESYLATE 10 MG PO TABS
10.0000 mg | ORAL_TABLET | Freq: Every day | ORAL | Status: DC
  Administered 2015-10-08 – 2015-10-11 (×4): 10 mg via ORAL
  Filled 2015-10-07 (×4): qty 1

## 2015-10-07 NOTE — Progress Notes (Signed)
Handoff from [place]. Patient sitting in chair with alarm on. Resting quietly, no distress or complaints. Will continue to monitor.

## 2015-10-07 NOTE — Progress Notes (Signed)
[facility] at [place] NAME: Amy [name]    MR#:  [id number]  DATE OF BIRTH:  Sep 15, 1927  SUBJECTIVE:   Shortness of breath improved.  + cough but non-productive.  Bronchospasm improved.    REVIEW OF SYSTEMS:    Review of Systems  Constitutional: Negative for chills and fever.  HENT: Negative for congestion and tinnitus.   Eyes: Negative for blurred vision and double vision.  Respiratory: Positive for cough, shortness of breath and wheezing.   Cardiovascular: Negative for chest pain, orthopnea and PND.  Gastrointestinal: Negative for abdominal pain, diarrhea, nausea and vomiting.  Genitourinary: Negative for dysuria and hematuria.  Neurological: Negative for dizziness, sensory change and focal weakness.  All other systems reviewed and are negative.   Nutrition: Heart healthy/Carb control Tolerating Diet: Yes Tolerating PT: Eval noted.    DRUG ALLERGIES:   Allergies  Allergen Reactions  . Atenolol     Other reaction(s): Other (See Comments) Decreased heart rate  . Codeine     Rash, difficulty breathing, nausea.    VITALS:  Blood pressure (!) 153/33, pulse 78, temperature 98.4 F (36.9 C), temperature source Oral, resp. rate 19, height '5\' 2"'$  (1.575 m), weight 116.1 kg (256 lb), SpO2 93 %.  PHYSICAL EXAMINATION:   Physical Exam  GENERAL:  82 y.o.-year-old patient sitting up in chair in NAD.    EYES: Pupils equal, round, reactive to light and accommodation. No scleral icterus. Extraocular muscles intact.  HEENT: Head atraumatic, normocephalic. Oropharynx and nasopharynx clear.  NECK:  Supple, no jugular venous distention. No thyroid enlargement, no tenderness.  LUNGS: Good A/E b/l.  Minimal end-exp. Wheezing b/l  No rales, rhonchi.   No use of accessory muscles. CARDIOVASCULAR: S1, S2 normal. No murmurs, rubs, or gallops.  ABDOMEN: Soft, nontender, nondistended. Bowel sounds present. No organomegaly or mass.  EXTREMITIES:  No cyanosis, clubbing or edema b/l.    NEUROLOGIC: Cranial nerves II through XII are intact. No focal Motor or sensory deficits b/l.   PSYCHIATRIC: The patient is alert and oriented x 3.  SKIN: No obvious rash, lesion, or ulcer.    LABORATORY PANEL:   CBC  Recent Labs Lab 10/04/15 0337 10/05/15 0817  WBC 12.1*  --   HGB 9.5* 9.7*  HCT 28.0*  --   PLT 223  --    ------------------------------------------------------------------------------------------------------------------  Chemistries   Recent Labs Lab 10/01/15 0847  10/04/15 0337  10/07/15 0512  NA 139  < > 137  < > 139  K 4.6  < > 4.3  < > 4.8  CL 107  < > 102  < > 103  CO2 25  < > 26  < > 27  GLUCOSE 201*  < > 195*  < > 266*  BUN 30*  < > 73*  < > 61*  CREATININE 1.27*  < > 1.80*  < > 1.53*  CALCIUM 8.8*  < > 8.1*  < > 8.4*  MG  --   --  2.2  --   --   AST 18  --   --   --   --   ALT 14  --   --   --   --   ALKPHOS 81  --   --   --   --   BILITOT 0.3  --   --   --   --   < > = values in this interval not displayed. ------------------------------------------------------------------------------------------------------------------  Cardiac Enzymes  Recent  Labs Lab 10/01/15 2346  TROPONINI <0.03   ------------------------------------------------------------------------------------------------------------------  RADIOLOGY:  No results found.   ASSESSMENT AND PLAN:   82 year old female with past medical history of diabetes, chronic kidney disease stage III, chronic diastolic CHF, COPD, hypothyroidism, essential hypertension who presented to the hospital due to shortness of breath.  1. Acute on chronic respiratory failure with hypoxia-secondary to COPD exacerbation. -Continue IV steroids, continue scheduled DuoNeb's, Pulmicort nebs. Cont. Flutter device.  Slow to improve.  -Wean off oxygen as tolerated.  2. COPD exacerbation-continue IV steroids, scheduled DuoNeb's, Pulmicort nebs, Tussionex, Flutter  device. -Continue empiric Levaquin. Chest x-ray showing no evidence of acute pneumonia. Likely underlying bronchitis.  3. Acute bronchitis-continue Levaquin, treated for COPD as mentioned above.   4. Chronic diastolic CHF-clinically patient is not congestive heart failure. -Appreciate cardiology input and continue home dose Lasix, Clonidine.   4. Essential HTN - cont. Clonidine Patch, Hydralazine. BP stable.  - will resume  Norvasc, Losartan/HCTZ.     5. DM - cont. SSI.  - hold Metformin.  Cont. Amaryl.  Cont. Carb control diet.  - follow BS closely as she is on IV steroid.   6. Hypothyroidism - cont. Synthroid.   7. Urinary Incontinence - cont. Oxybutynin.   8. Hyperlipidemia - cont. Simvastatin.    All the records are reviewed and case discussed with Care Management/Social Workerr. Management plans discussed with the patient, family and they are in agreement.  CODE STATUS: Full Code  DVT Prophylaxis: Lovenox  TOTAL TIME TAKING CARE OF THIS PATIENT: 25 minutes.   POSSIBLE D/C IN 1-2 DAYS, DEPENDING ON CLINICAL CONDITION.   Henreitta Leber M.D on 10/07/2015 at 1:10 PM  Between 7am to 6pm - Pager - [phone]  After 6pm go to [email or web address] - password [email or web address] [facility]  [place] [facility]  Office  [phone]  CC: Primary care physician; Leonel Ramsay, MD

## 2015-10-07 NOTE — Progress Notes (Signed)
Order for '5mg'$  Norvasc placed by cardiology. Cardiology made aware that diastolic bp has been and is still running in 30s. RN told to give the '5mg'$  Norvasc. Nursing will continue to assess.

## 2015-10-07 NOTE — Progress Notes (Signed)
    Subjective:  Feeling better today. No chest pain. No shortness of breath at rest. Cough is improving.  Objective:  Vital Signs in the last 24 hours: Temp:  [97.6 F (36.4 C)-98.4 F (36.9 C)] 98.4 F (36.9 C) (08/13 0802) Pulse Rate:  [74-79] 79 (08/13 0802) Resp:  [17-22] 17 (08/13 0802) BP: (104-160)/(31-46) 151/37 (08/13 0802) SpO2:  [91 %-99 %] 91 % (08/13 0802) FiO2 (%):  [21 %] 21 % (08/12 1519) Weight:  [116.1 kg (256 lb)] 116.1 kg (256 lb) (08/13 0502)  Intake/Output from previous day: 08/12 0701 - 08/13 0700 In: 720 [P.O.:720] Out: 1500 [Urine:1500]  Physical Exam: Pt is alert and oriented, NAD HEENT: normal Neck: JVP - normal Lungs: Bilateral rhonchi and expiratory wheezing better than yesterday's exam CV: RRR with 2/6 systolic murmur at the left sternal border Abd: soft, NT, Positive BS, no hepatomegaly Ext: 1+ pretibial edema bilaterally Skin: warm/dry no rash  Lab Results:  Recent Labs  10/05/15 0817  HGB 9.7*    Recent Labs  10/06/15 0506 10/07/15 0512  NA 141 139  K 4.1 4.8  CL 102 103  CO2 29 27  GLUCOSE 75 266*  BUN 63* 61*  CREATININE 1.59* 1.53*   No results for input(s): TROPONINI in the last 72 hours.  Invalid input(s): CK, MB  Cardiac Studies: 2-D echocardiogram 10/02/2015: Study Conclusions  - Procedure narrative: Transthoracic echocardiography. The study   was technically difficult, as a result of restricted patient   mobility. - Left ventricle: The cavity size was normal. Wall thickness was   increased in a pattern of moderate LVH. Systolic function was   normal. The estimated ejection fraction was in the range of 55%   to 60%. Doppler parameters are consistent with abnormal left   ventricular relaxation (grade 1 diastolic dysfunction). - Mitral valve: Severely calcified annulus. - Left atrium: The atrium was mildly dilated.  Tele: Personally reviewed: Normal sinus rhythm  Assessment/Plan:  1. Acute on chronic  respiratory failure: Based on her lung exam I suspect this is primarily a pulmonary issue, likely exacerbated by acute on chronic diastolic heart failure. Current treatment with IV steroids and nebulized therapies.  2. Acute on chronic diastolic heart failure: LVEF 55-60%. Would continue furosemide 40 mg daily.  3. Paroxysmal atrial fibrillation, single episode: No recurrence on telemetry. Discussion as per yesterday's note. I agree that anticoagulation is not indicated at this time.  4. Hypertension: The patient has a wide pulse pressure and predominantly has systolic hypertension. I reviewed her home medications which included losartan/HCTZ and amlodipine. We'll resume amlodipine today and discontinue Imdur/hydralazine since this patient has preserved LV systolic function. Agree with holding losartan/HCTZ at the current time to avoid acute kidney injury. She continues on doxazosin and clonidine patch.  Disposition: Overall the patient's cardiac status is stable.  [name], M.D. 10/07/2015, 11:08 AM

## 2015-10-08 ENCOUNTER — Inpatient Hospital Stay: Payer: Medicare Other

## 2015-10-08 DIAGNOSIS — N183 Chronic kidney disease, stage 3 (moderate): Secondary | ICD-10-CM

## 2015-10-08 DIAGNOSIS — J449 Chronic obstructive pulmonary disease, unspecified: Secondary | ICD-10-CM

## 2015-10-08 DIAGNOSIS — N179 Acute kidney failure, unspecified: Secondary | ICD-10-CM

## 2015-10-08 DIAGNOSIS — J441 Chronic obstructive pulmonary disease with (acute) exacerbation: Secondary | ICD-10-CM

## 2015-10-08 DIAGNOSIS — J4489 Other specified chronic obstructive pulmonary disease: Secondary | ICD-10-CM

## 2015-10-08 DIAGNOSIS — D649 Anemia, unspecified: Secondary | ICD-10-CM

## 2015-10-08 LAB — GLUCOSE, CAPILLARY
GLUCOSE-CAPILLARY: 295 mg/dL — AB (ref 65–99)
GLUCOSE-CAPILLARY: 349 mg/dL — AB (ref 65–99)
Glucose-Capillary: 275 mg/dL — ABNORMAL HIGH (ref 65–99)
Glucose-Capillary: 243 mg/dL — ABNORMAL HIGH (ref 65–99)

## 2015-10-08 LAB — CBC
HEMATOCRIT: 30.3 % — AB (ref 35.0–47.0)
Hemoglobin: 10.1 g/dL — ABNORMAL LOW (ref 12.0–16.0)
MCH: 31.1 pg (ref 26.0–34.0)
MCHC: 33.2 g/dL (ref 32.0–36.0)
MCV: 93.6 fL (ref 80.0–100.0)
PLATELETS: 259 10*3/uL (ref 150–440)
RBC: 3.24 MIL/uL — ABNORMAL LOW (ref 3.80–5.20)
RDW: 17.9 % — AB (ref 11.5–14.5)
WBC: 16.3 10*3/uL — AB (ref 3.6–11.0)

## 2015-10-08 LAB — CREATININE, SERUM
Creatinine, Ser: 1.54 mg/dL — ABNORMAL HIGH (ref 0.44–1.00)
GFR calc Af Amer: 34 mL/min — ABNORMAL LOW (ref >60)
GFR calc non Af Amer: 29 mL/min — ABNORMAL LOW (ref >60)

## 2015-10-08 MED ORDER — LOSARTAN POTASSIUM 50 MG PO TABS
50.0000 mg | ORAL_TABLET | Freq: Every day | ORAL | Status: DC
Start: 2015-10-08 — End: 2015-10-11
  Administered 2015-10-08 – 2015-10-11 (×4): 50 mg via ORAL
  Filled 2015-10-08 (×4): qty 1

## 2015-10-08 MED ORDER — DEXTROSE 5 % IV SOLN
1.0000 g | INTRAVENOUS | Status: DC
  Administered 2015-10-08 – 2015-10-11 (×4): 1 g via INTRAVENOUS
  Filled 2015-10-08 (×4): qty 10

## 2015-10-08 MED ORDER — DEXTROSE 5 % IV SOLN
250.0000 mg | INTRAVENOUS | Status: DC
  Administered 2015-10-08 – 2015-10-10 (×3): 250 mg via INTRAVENOUS
  Filled 2015-10-08 (×3): qty 250

## 2015-10-08 NOTE — Progress Notes (Signed)
RN notified

## 2015-10-08 NOTE — Progress Notes (Signed)
[facility] at [place] NAME: Amy [name]    MR#:  [id number]  DATE OF BIRTH:  [birth year]  SUBJECTIVE:   Still having significant wheezing bronchospasm on minimal exertion.  Cough non-productive.  Not much better from yesterday.    REVIEW OF SYSTEMS:    Review of Systems  Constitutional: Negative for chills and fever.  HENT: Negative for congestion and tinnitus.   Eyes: Negative for blurred vision and double vision.  Respiratory: Positive for cough, shortness of breath and wheezing.   Cardiovascular: Negative for chest pain, orthopnea and PND.  Gastrointestinal: Negative for abdominal pain, diarrhea, nausea and vomiting.  Genitourinary: Negative for dysuria and hematuria.  Neurological: Negative for dizziness, sensory change and focal weakness.  All other systems reviewed and are negative.   Nutrition: Heart healthy/Carb control Tolerating Diet: Yes Tolerating PT: Eval noted.    DRUG ALLERGIES:   Allergies  Allergen Reactions  . Atenolol     Other reaction(s): Other (See Comments) Decreased heart rate  . Codeine     Rash, difficulty breathing, nausea.    VITALS:  Blood pressure (!) 171/45, pulse 79, temperature 97.7 F (36.5 C), temperature source Oral, resp. rate 20, height '5\' 2"'$  (1.575 m), weight 116 kg (255 lb 12.8 oz), SpO2 95 %.  PHYSICAL EXAMINATION:   Physical Exam  GENERAL:  82 y.o.-year-old patient sitting up in chair in mild resp. distress.    EYES: Pupils equal, round, reactive to light and accommodation. No scleral icterus. Extraocular muscles intact.  HEENT: Head atraumatic, normocephalic. Oropharynx and nasopharynx clear.  NECK:  Supple, no jugular venous distention. No thyroid enlargement, no tenderness.  LUNGS: Diffuse rhonchi and wheezing bilaterally, good air entry bilaterally. Positive use of accessory muscles. CARDIOVASCULAR: S1, S2 normal. No murmurs, rubs, or gallops.  ABDOMEN: Soft, nontender,  nondistended. Bowel sounds present. No organomegaly or mass.  EXTREMITIES: No cyanosis, clubbing or edema b/l.    NEUROLOGIC: Cranial nerves II through XII are intact. No focal Motor or sensory deficits b/l. Globally weak.  PSYCHIATRIC: The patient is alert and oriented x 3.  SKIN: No obvious rash, lesion, or ulcer.    LABORATORY PANEL:   CBC  Recent Labs Lab 10/08/15 0537  WBC 16.3*  HGB 10.1*  HCT 30.3*  PLT 259   ------------------------------------------------------------------------------------------------------------------  Chemistries   Recent Labs Lab 10/04/15 0337  10/07/15 0512 10/08/15 0448  NA 137  < > 139  --   K 4.3  < > 4.8  --   CL 102  < > 103  --   CO2 26  < > 27  --   GLUCOSE 195*  < > 266*  --   BUN 73*  < > 61*  --   CREATININE 1.80*  < > 1.53* 1.54*  CALCIUM 8.1*  < > 8.4*  --   MG 2.2  --   --   --   < > = values in this interval not displayed. ------------------------------------------------------------------------------------------------------------------  Cardiac Enzymes  Recent Labs Lab 10/01/15 2346  TROPONINI <0.03   ------------------------------------------------------------------------------------------------------------------  RADIOLOGY:  Dg Chest 1 View  Result Date: 10/08/2015 CLINICAL DATA:  82 year old female with shortness of breath chest pain and lower extremity swelling. Initial encounter. EXAM: CHEST 1 VIEW COMPARISON:  Chest CTA 10/01/2015 and earlier. FINDINGS: Portable AP upright view at 0618 hours. Moderate size gastric hiatal hernia is less apparent. Stable cardiac size and mediastinal contours. Calcified aortic atherosclerosis. Visualized tracheal air column is within normal  limits. Regressed pulmonary vascularity since 10/01/2015. No pneumothorax. The right lung appears clear. However, there is new retrocardiac opacity at the left lung base partially obscuring the diaphragm. IMPRESSION: 1. New confluent left lung base  opacity might reflect atelectasis, but is suspicious for aspiration or infection. 2.  Calcified aortic atherosclerosis. Electronically Signed   By: Genevie Ann M.D.   On: 10/08/2015 07:14     ASSESSMENT AND PLAN:   82 year old female with past medical history of diabetes, chronic kidney disease stage III, chronic diastolic CHF, COPD, hypothyroidism, essential hypertension who presented to the hospital due to shortness of breath.  1. Acute on chronic respiratory failure with hypoxia-secondary to COPD exacerbation.  -Continue IV steroids, continue scheduled DuoNeb's, Pulmicort nebs. Cont. Flutter device.  Slow to improve.  -Wean off oxygen as tolerated.  2. COPD exacerbation-continue IV steroids, scheduled DuoNeb's, Pulmicort nebs, Tussionex, Flutter device. - CXR this a.m. Showing ?? LLL opacity consistent with Pneumonia.  D/c Levaquin and started on IV Ceftriaxone, Zithromax.  - slow to improve and cont. Aggressive Pulm. Toileting.   3. Pneumoina - changed from Oral Levaquin and IV Ceftriaxone, Zithromax.  - follow cultures.  Follow clinically. -  4. Chronic diastolic CHF-clinically patient is not congestive heart failure. -Appreciate cardiology input and continue home dose Lasix, Clonidine.   4. Essential HTN - cont. Clonidine Patch, Hydralazine, Norvasc. Bp still on high side.  - will resume Losartan but at a lower dose.     5. DM - cont. SSI.  - hold Metformin.  Cont. Amaryl.  Cont. Carb control diet.  - follow BS closely as she is on IV steroids   6. Hypothyroidism - cont. Synthroid.   7. Urinary Incontinence - cont. Oxybutynin.   8. Hyperlipidemia - cont. Simvastatin.    All the records are reviewed and case discussed with Care Management/Social Workerr. Management plans discussed with the patient, family and they are in agreement.  CODE STATUS: Full Code  DVT Prophylaxis: Lovenox  TOTAL TIME TAKING CARE OF THIS PATIENT: 25 minutes.   POSSIBLE D/C IN 1-2 DAYS, DEPENDING ON  CLINICAL CONDITION.   Henreitta Leber M.D on 10/08/2015 at 3:30 PM  Between 7am to 6pm - Pager - [phone]  After 6pm go to [email or web address] - password [email or web address]  [place] [facility]  Office  [phone]  CC: Primary care physician; Leonel Ramsay, MD

## 2015-10-08 NOTE — Care Management Important Message (Signed)
Important Message  Patient Details  Name: Amy [name] MRN: [id number] Date of Birth: [birth year]   Medicare Important Message Given:  Yes    Jolly Mango, RN 10/08/2015, 8:54 AM

## 2015-10-08 NOTE — Progress Notes (Signed)
Inpatient Diabetes Program Recommendations  AACE/ADA: New Consensus Statement on Inpatient Glycemic Control (2015)  Target Ranges:  Prepandial:   less than 140 mg/dL      Peak postprandial:   less than 180 mg/dL (1-2 hours)      Critically ill patients:  140 - 180 mg/dL   Results for HAMDI, VARI (MRN [id number]) as of 10/08/2015 09:59  Ref. Range 10/07/2015 07:32 10/07/2015 11:25 10/07/2015 16:45 10/07/2015 21:35 10/08/2015 07:43  Glucose-Capillary Latest Ref Range: 65 - 99 mg/dL 272 (H) 319 (H) 229 (H) 339 (H) 275 (H)   Review of Glycemic Control  Diabetes history: DM2 Outpatient Diabetes medications: Actos 15 mg daily, Metformin 1000 mg bid, Amaryl 4 mg daily Current orders for Inpatient glycemic control: Amaryl 4 mg QAM, Novolog 0-20 units TID with meals, Novolog 0-5 units QHS  Inpatient Diabetes Program Recommendations: Insulin - Basal: Please consider ordering low dose basal insulin while ordered steroids. Recommend starting with Lantus 12 units Q24H (based on 116 kg x 0.1 units). Please note that if Lantus is ordered as recommended, once steroids are tapered Lantus will need to be adjusted accordingly. Insulin - Meal Coverage: If steroids are continued, please consider ordering Novolog 5 units TID with meals for meal coverage.  Thanks, Barnie Alderman, RN, MSN, CDE Diabetes Coordinator Inpatient Diabetes Program [phone] (Team Pager from [place] to [place]) [phone] ([facility] office) [phone] [facility] office) [phone] [facility] office)

## 2015-10-08 NOTE — Progress Notes (Signed)
Physical Therapy Treatment Patient Details Name: Amy [name] MRN: [id number] DOB: [birth year] Today's Date: 10/08/2015    History of Present Illness Pt is an 82 year old female with diastolic heart failure and preserved ejection fraction, hypothyroidism and diabetes who presents with acute on chronic hypoxic respiratory failure.    PT Comments    Pt declined session this am. Stated she had just returned from commode/bath and was to fatigued.  Returned later in am and she agreed.  Stood with poor safety pulling up on walker and difficult to redirect and educate.  Pt was able to stand x 2 attempts but was unable to take more than 2 small steps stating her left leg was limiting her and felt like it would buckle.  +2 assist and chair follow for safety.  O2 sats remain 94-96 with session on room air. Transferred to/from commode with min a x 1 with poor hand placements and general safety.  Pt has rollator walker in room.  Pt stated that she requested a walker with a seat on it.  Discussed risks/benefits of both walkers and pt continued to state that she wanted that type of walker so she could sit when she needed to.  "Don't you take that away from me"    Discussed with CNA regarding her mobility status this morning.  Stated pt was able to walk to/from bathroom without assistive device.  She was fatigued and SOB.  Pt is not consistent with her mobility skills and due to her continued pulmonary status, SNF may be of benefit for pt upon discharge as she admits today that she would have difficulty at home in her current state. Will continue to monitor mobility skills.   Follow Up Recommendations  SNF     Equipment Recommendations  Rolling walker with 5" wheels    Recommendations for Other Services       Precautions / Restrictions Precautions Precautions: Fall Restrictions Weight Bearing Restrictions: No    Mobility  Bed Mobility                  Transfers Overall transfer  level: Needs assistance Equipment used: Rolling walker (2 wheeled) Transfers: Sit to/from Stand;Stand Pivot Transfers Sit to Stand: Min assist Stand pivot transfers: Min assist       General transfer comment: in chair  Ambulation/Gait Ambulation/Gait assistance: Min assist Ambulation Distance (Feet): 3 Feet Assistive device: Rolling walker (2 wheeled)           Stairs            Wheelchair Mobility    Modified Rankin (Stroke Patients Only)       Balance Overall balance assessment: Needs assistance Sitting-balance support: Feet supported Sitting balance-Leahy Scale: Fair     Standing balance support: Bilateral upper extremity supported Standing balance-Leahy Scale: Fair                      Cognition Arousal/Alertness: Awake/alert Behavior During Therapy: WFL for tasks assessed/performed Overall Cognitive Status: Within Functional Limits for tasks assessed                      Exercises      General Comments        Pertinent Vitals/Pain Pain Assessment: No/denies pain    Home Living                      Prior Function  PT Goals (current goals can now be found in the care plan section)      Frequency  Min 2X/week    PT Plan Discharge plan needs to be updated    Co-evaluation             End of Session   Activity Tolerance: Patient limited by fatigue Patient left: in chair     Time: 0352-4818 PT Time Calculation (min) (ACUTE ONLY): 23 min  Charges:  $Gait Training: 8-22 mins $Therapeutic Activity: 8-22 mins                    G Codes:      [name], PTA 10/08/15, 11:44 AM

## 2015-10-08 NOTE — Progress Notes (Signed)
Patient: Amy [name] / Admit Date: 10/01/2015 / Date of Encounter: 10/08/2015, 8:01 AM   Subjective: Feels more SOB this morning with exertion. Ok when at rest. Cough improving. UOP 5.5 L for the admission. Renal function stable.   Review of Systems: Review of Systems  Constitutional: Positive for malaise/fatigue. Negative for chills, diaphoresis, fever and weight loss.  HENT: Negative for congestion.   Eyes: Negative for discharge and redness.  Respiratory: Positive for cough, shortness of breath and wheezing. Negative for sputum production.   Cardiovascular: Negative for chest pain, palpitations, orthopnea, claudication, leg swelling and PND.  Gastrointestinal: Negative for abdominal pain, heartburn, nausea and vomiting.  Musculoskeletal: Negative for falls and myalgias.  Skin: Negative for rash.  Neurological: Positive for weakness. Negative for dizziness, tingling, tremors, sensory change, speech change, focal weakness and loss of consciousness.  Endo/Heme/Allergies: Does not bruise/bleed easily.  Psychiatric/Behavioral: Negative for substance abuse. The patient is nervous/anxious.   All other systems reviewed and are negative.   Objective: Telemetry: NSR, 80's bpm Physical Exam: Blood pressure (!) 146/38, pulse 72, temperature 98.1 F (36.7 C), temperature source Oral, resp. rate 16, height '5\' 2"'$  (1.575 m), weight 255 lb 12.8 oz (116 kg), SpO2 92 %. Body mass index is 46.79 kg/m. General: Well developed, well nourished, in no acute distress. Head: Normocephalic, atraumatic, sclera non-icteric, no xanthomas, nares are without discharge. Neck: Negative for carotid bruits. JVP not elevated. Lungs: Decreased breath sounds bilaterally with diffuse rhonchi and wheezing. Breathing is unlabored. Heart: RRR S1 S2, II/VI systolic murmur, no rubs, or gallops.  Abdomen: Soft, non-tender, non-distended with normoactive bowel sounds. No rebound/guarding. Extremities: No clubbing or  cyanosis. Trace pre-tibial LE edema. Distal pedal pulses are 2+ and equal bilaterally. Neuro: Alert and oriented X 3. Moves all extremities spontaneously. Psych:  Responds to questions appropriately with a normal affect.   Intake/Output Summary (Last 24 hours) at 10/08/15 0801 Last data filed at 10/08/15 0700  Gross per 24 hour  Intake              483 ml  Output             1000 ml  Net             -517 ml    Inpatient Medications:  . amLODipine  10 mg Oral Daily  . aspirin  81 mg Oral Daily  . budesonide (PULMICORT) nebulizer solution  0.25 mg Nebulization BID  . chlorpheniramine-HYDROcodone  5 mL Oral Q12H  . cloNIDine  0.3 mg Transdermal Weekly  . clopidogrel  75 mg Oral Daily  . docusate sodium  100 mg Oral BID  . doxazosin  8 mg Oral Daily  . enoxaparin (LOVENOX) injection  40 mg Subcutaneous Q24H  . furosemide  40 mg Oral Daily  . glimepiride  4 mg Oral QAC breakfast  . insulin aspart  0-20 Units Subcutaneous TID WC  . insulin aspart  0-5 Units Subcutaneous QHS  . ipratropium-albuterol  3 mL Nebulization Q4H  . levofloxacin  250 mg Oral Daily  . levothyroxine  125 mcg Oral Daily  . methylPREDNISolone (SOLU-MEDROL) injection  40 mg Intravenous Q6H  . oxybutynin  5 mg Oral Daily  . pantoprazole  40 mg Oral Daily  . potassium chloride  10 mEq Oral BID  . simvastatin  40 mg Oral Daily  . sodium chloride flush  3 mL Intravenous Q12H   Infusions:    Labs:  Recent Labs  10/06/15 0506 10/07/15 0512  10/08/15 0448  NA 141 139  --   K 4.1 4.8  --   CL 102 103  --   CO2 29 27  --   GLUCOSE 75 266*  --   BUN 63* 61*  --   CREATININE 1.59* 1.53* 1.54*  CALCIUM 8.4* 8.4*  --    No results for input(s): AST, ALT, ALKPHOS, BILITOT, PROT, ALBUMIN in the last 72 hours.  Recent Labs  10/05/15 0817 10/08/15 0537  WBC  --  16.3*  HGB 9.7* 10.1*  HCT  --  30.3*  MCV  --  93.6  PLT  --  259   No results for input(s): CKTOTAL, CKMB, TROPONINI in the last 72  hours. Invalid input(s): POCBNP No results for input(s): HGBA1C in the last 72 hours.   Weights: Filed Weights   10/06/15 0550 10/07/15 0502 10/08/15 0426  Weight: 257 lb 9.6 oz (116.8 kg) 256 lb (116.1 kg) 255 lb 12.8 oz (116 kg)     Radiology/Studies:  Dg Chest 1 View  Result Date: 10/08/2015 CLINICAL DATA:  82 year old female with shortness of breath chest pain and lower extremity swelling. Initial encounter. EXAM: CHEST 1 VIEW COMPARISON:  Chest CTA 10/01/2015 and earlier. FINDINGS: Portable AP upright view at 0618 hours. Moderate size gastric hiatal hernia is less apparent. Stable cardiac size and mediastinal contours. Calcified aortic atherosclerosis. Visualized tracheal air column is within normal limits. Regressed pulmonary vascularity since 10/01/2015. No pneumothorax. The right lung appears clear. However, there is new retrocardiac opacity at the left lung base partially obscuring the diaphragm. IMPRESSION: 1. New confluent left lung base opacity might reflect atelectasis, but is suspicious for aspiration or infection. 2.  Calcified aortic atherosclerosis. Electronically Signed   By: Genevie Ann M.D.   On: 10/08/2015 07:14   Ct Angio Chest Pe W Or Wo Contrast  Result Date: 10/01/2015 CLINICAL DATA:  Chest pain and short of breath EXAM: CT ANGIOGRAPHY CHEST WITH CONTRAST TECHNIQUE: Multidetector CT imaging of the chest was performed using the standard protocol during bolus administration of intravenous contrast. Multiplanar CT image reconstructions and MIPs were obtained to evaluate the vascular anatomy. CONTRAST:  60 cc Isovue 370 COMPARISON:  None. FINDINGS: There are no filling defects in the pulmonary arterial tree to suggest acute pulmonary thromboembolism There is no evidence of aortic dissection or aneurysm. Great vessels are grossly patent. There is no abnormal mediastinal adenopathy. No pericardial effusion. Three vessel coronary artery calcification. Mitral annular calcification. Mild  peripheral aortic valvular calcification. Large hiatal hernia No pneumothorax or pleural effusion. 6 mm right upper pole nodule on image 17. 6 mm left lower lobe pulmonary nodule on image 52. 7 mm left lower lobe pulmonary nodule on image 52. No acute bony deformity. Review of the MIP images confirms the above findings. IMPRESSION: No evidence of acute pulmonary thromboembolism Hiatal hernia. Multiple bilateral small pulmonary nodules. The largest in the left lower lobe measures 7 mm. Non-contrast chest CT at 3-6 months is recommended. If the nodules are stable at time of repeat CT, then future CT at 18-24 months (from today's scan) is considered optional for low-risk patients, but is recommended for high-risk patients. This recommendation follows the consensus statement: Guidelines for Management of Incidental Pulmonary Nodules Detected on CT Images:From the Fleischner Society 2017; published online before print (10.1148/radiol.8938101751). Electronically Signed   By: Marybelle Killings M.D.   On: 10/01/2015 16:09   US Venous Img Lower Bilateral  Result Date: 10/01/2015 CLINICAL DATA:  Bilateral lower extremity  edema, left greater than right. Evaluate for DVT. EXAM: BILATERAL LOWER EXTREMITY VENOUS DOPPLER ULTRASOUND TECHNIQUE: Gray-scale sonography with graded compression, as well as color Doppler and duplex ultrasound were performed to evaluate the lower extremity deep venous systems from the level of the common femoral vein and including the common femoral, femoral, profunda femoral, popliteal and calf veins including the posterior tibial, peroneal and gastrocnemius veins when visible. The superficial great saphenous vein was also interrogated. Spectral Doppler was utilized to evaluate flow at rest and with distal augmentation maneuvers in the common femoral, femoral and popliteal veins. COMPARISON:  Left lower extremity venous Doppler ultrasound - 10/12/2013 FINDINGS: Examination is degraded due to patient body  habitus and poor sonographic window. RIGHT LOWER EXTREMITY Common Femoral Vein: No evidence of thrombus. Normal compressibility, respiratory phasicity and response to augmentation. Saphenofemoral Junction: No evidence of thrombus. Normal compressibility and flow on color Doppler imaging. Profunda Femoral Vein: No evidence of thrombus. Normal compressibility and flow on color Doppler imaging. Femoral Vein: No evidence of thrombus. Normal compressibility, respiratory phasicity and response to augmentation. Popliteal Vein: No evidence of thrombus. Normal compressibility, respiratory phasicity and response to augmentation. Calf Veins: No evidence of thrombus. Normal compressibility and flow on color Doppler imaging. Superficial Great Saphenous Vein: No evidence of thrombus. Normal compressibility and flow on color Doppler imaging. Venous Reflux:  None. Other Findings:  None. LEFT LOWER EXTREMITY Common Femoral Vein: No evidence of thrombus. Normal compressibility, respiratory phasicity and response to augmentation. Saphenofemoral Junction: No evidence of thrombus. Normal compressibility and flow on color Doppler imaging. Profunda Femoral Vein: No evidence of thrombus. Normal compressibility and flow on color Doppler imaging. Femoral Vein: No evidence of thrombus. Normal compressibility, respiratory phasicity and response to augmentation. Popliteal Vein: No evidence of thrombus. Normal compressibility, respiratory phasicity and response to augmentation. Calf Veins: No evidence of thrombus. Normal compressibility and flow on color Doppler imaging. Superficial Great Saphenous Vein: No evidence of thrombus. Normal compressibility and flow on color Doppler imaging. Venous Reflux:  None. Other Findings:  None. IMPRESSION: No evidence of DVT within either lower extremity. Electronically Signed   By: Sandi Mariscal M.D.   On: 10/01/2015 15:45   Dg Chest Portable 1 View  Result Date: 10/01/2015 CLINICAL DATA:  82 year old female  with lower extremity swelling for several days, shortness of breath and chest pain. Initial encounter. EXAM: PORTABLE CHEST 1 VIEW COMPARISON:  12/28/2014 and earlier. FINDINGS: Portable AP upright view at 0850 hours. Chronic hiatal hernia. Stable cardiac size and mediastinal contours. Calcified aortic atherosclerosis. Mild acute increased interstitial opacity diffusely. No pneumothorax, pleural effusion or consolidation. IMPRESSION: 1. Mild pulmonary interstitial edema suspected. No other acute cardiopulmonary abnormality. 2.  Calcified aortic atherosclerosis. Electronically Signed   By: Genevie Ann M.D.   On: 10/01/2015 09:05     Assessment and Plan  Principal Problem:   Acute on chronic diastolic (congestive) heart failure (HCC) Active Problems:   Acute renal failure superimposed on stage 3 chronic kidney disease ([place])   New onset atrial fibrillation (HCC)   Anemia   COPD exacerbation (HCC)   Essential hypertension   Atherosclerosis of coronary artery bypass graft of native heart without angina pectoris    1. Acute on chronic respiratory failure: -Likely multifactorial initially including pulmonary and acute on chronic diastolic CHF. Now, more pulmonary issue it appears -Weaned off oxygen -Continue IV steroids and nebs per IM  2. Acute on chronic diastolic CHF: -Likely at dry weight -Renal function stable -Continue PO Lasix 40 mg  daily  3. PAF: -No recurrence on telemetry -Hold anticoagulation for now unless she has recurrence   4. HTN: -Improving this morning after resumption of amlodipine -Continue current medications and monitor   Signed, Marcille Blanco [place] Pager: [phone] 10/08/2015, 8:01 AM   Attending Note Patient seen and examined, agree with detailed note above,  Patient presentation and plan discussed on rounds.   Still with significant cough this morning Does not feel that she is ready to go home Has not been ambulating very much Reports  that she lives alone, feels weak Denies any PND, orthopnea, abdominal bloating or leg swelling Main complaint is thick cough, difficulty taking a deep breath Denies any previous smoking history apart from secondhand exposure  Receiving prednisone this morning, nebulizer treatments Clinical exam with coarse breath sounds bilaterally, expiratory wheezing Heart sounds regular with no murmur appreciated, abdomen soft nontender, no leg edema  Lab reviewed showing stable but elevated creatinine 1.54, Anemia with hematocrit 30  Telemetry reviewed, maintaining normal sinus rhythm   --- Acute respiratory failure Clinical picture appears more like a bronchitis Diastolic CHF has been treated, grossly no signs of fluid overload At this point needs antibiotics, prednisone, respiratory support  --- PAF Maintaining normal sinus rhythm on telemetry    Greater than 50% was spent in counseling and coordination of care with patient Total encounter time 25 minutes or more   Signed: Esmond Plants  M.D., Ph.D. [facility]

## 2015-10-08 NOTE — Plan of Care (Signed)
Problem: Activity: Goal: Risk for activity intolerance will decrease Outcome: Progressing Working with PT  Problem: Fluid Volume: Goal: Ability to maintain a balanced intake and output will improve Outcome: Progressing Daily weight, I&O  Problem: Cardiac: Goal: Ability to achieve and maintain adequate cardiopulmonary perfusion will improve Outcome: Progressing Daily weights, I&O, room air

## 2015-10-09 ENCOUNTER — Telehealth: Payer: Self-pay | Admitting: *Deleted

## 2015-10-09 ENCOUNTER — Inpatient Hospital Stay: Payer: Medicare Other

## 2015-10-09 DIAGNOSIS — R911 Solitary pulmonary nodule: Secondary | ICD-10-CM

## 2015-10-09 LAB — GLUCOSE, CAPILLARY
GLUCOSE-CAPILLARY: 262 mg/dL — AB (ref 65–99)
GLUCOSE-CAPILLARY: 290 mg/dL — AB (ref 65–99)
GLUCOSE-CAPILLARY: 343 mg/dL — AB (ref 65–99)
Glucose-Capillary: 247 mg/dL — ABNORMAL HIGH (ref 65–99)

## 2015-10-09 MED ORDER — BUDESONIDE 0.5 MG/2ML IN SUSP
0.5000 mg | Freq: Two times a day (BID) | RESPIRATORY_TRACT | Status: DC
  Administered 2015-10-09 – 2015-10-11 (×4): 0.5 mg via RESPIRATORY_TRACT
  Filled 2015-10-09 (×4): qty 2

## 2015-10-09 MED ORDER — METHYLPREDNISOLONE SODIUM SUCC 40 MG IJ SOLR
40.0000 mg | Freq: Two times a day (BID) | INTRAMUSCULAR | Status: DC
  Administered 2015-10-09 – 2015-10-10 (×2): 40 mg via INTRAVENOUS
  Filled 2015-10-09 (×2): qty 1

## 2015-10-09 NOTE — Telephone Encounter (Signed)
Order for CT Chest with contrast ordered for 8 weeks. Pt needs OV after CT chest with DR. Pt put on recall list.

## 2015-10-09 NOTE — Clinical Social Work Note (Signed)
MSW presented bed offers to patient and patient would like to go to [facility] for short term rehab.  MSW contacted [facility] and they can accept patient once she is medically ready for discharge and orders have been received.  MSW continuing to follow patient's progress throughout discharge planning.  Jones Broom. Taleia Sadowski, MSW [phone]  Mon-Fri 8a-4:30p 10/09/2015 3:03 PM

## 2015-10-09 NOTE — Care Management (Signed)
Requested [facility] evaluate patient for appropriateness and she was no tan appropriate candidate for LTACH.

## 2015-10-09 NOTE — Evaluation (Signed)
Clinical/Bedside Swallow Evaluation Patient Details  Name: Amy [name] MRN: [id number] Date of Birth: 1928/01/25  Today's Date: 10/09/2015 Time: SLP Start Time (ACUTE ONLY): 1555 SLP Stop Time (ACUTE ONLY): 1655 SLP Time Calculation (min) (ACUTE ONLY): 60 min  Past Medical History:  Past Medical History:  Diagnosis Date  . Bladder incontinence   . CAD (coronary artery disease)    a. cardiac cath 05/2009: proximal LAD 90% stenosis s/p PCI/Xience 2.75 x 12 mm DES, mid LAD 40%, diagonal 40%, proximal LCx 40% followed by 40% LCx lesion, 30%-40%-30% lesion noted in the RCA with distal RCA 30% and 25% lesions  . Chronic diastolic (congestive) heart failure ([place])    a. Lexiscan 2013: no ischemia, normal EF b. echo 09/2015: EF 55-60% w/ Grade 1 DD  . CKD (chronic kidney disease) stage 3, GFR 30-59 ml/min   . Degenerative arthritis of knee    bilateral knees  . Diabetes mellitus    Type II  . Hiatal hernia   . Hypertension   . Iron deficiency   . Menopausal symptoms   . Morbid obesity ([place])   . Thyroid disease    hypothyroidism   Past Surgical History:  Past Surgical History:  Procedure Laterality Date  . COLONOSCOPY  2015  . REPLACEMENT TOTAL KNEE BILATERAL    . TOTAL VAGINAL HYSTERECTOMY     ovarian mass, not cancerous  . UPPER GI ENDOSCOPY  2015   HPI:  Pt is a 82 y.o. female with a known history of many medical issues including CAD, DM, CHF, Hiatal Hernia, Morbid Obesity, HTN, thyroid dis., and diastolic heart failure and pEF by ECHO 02/2015 Who presents with dyspnea on exertion, lower extremity edema and shortness of breath over the past 3 days. Patient reports compliance with her medications and diet. Pt does endorse periodic episodes of regurgitation and Reflux symptoms. Pt described feelings of fullness "almost cutting off my breath" to which she feels the solution is to regurgitate to "relieve the pressure so that I can breath again". Suspect this presentation could be  increasing risk for aspiration of Reflux material thus impacting her Pulmonary status.    Assessment / Plan / Recommendation Clinical Impression  Pt appears to adequately tolerate trials of po's w/ no immediate, overt s/s of aspiration noted. Pt consumed trials of thin liquids and purees w/ no overt coughing or change in pulmonary status; no wet vocal quality post swallowing when assessed via phonation. Oral phase appeared wfl for bolus management. However, pt does have a dx of Hiatal Hernia and as per Pulmonary MD, pt has "a significant hiatal hernia, which is likely contributing to dyspnea and atelectasis. CT of the chest shows atelectasis of the medial segment of the right middle lobe. This is most likely due to external compression. Pt is at increased risk for apiration of Regurgitated material, reflux. Strongly recommend f/u w/ GI for assessment and management of the Esophageal phase dysphagia and Hiatal Hernia. Pt appears at decreased risk for aspiration from an oropharyngeal phase standpoint. Recommend a mech soft diet w/ thin liquids w/ strict Reflux precautions; general aspiration precautions. NSG updated. ST will be available for any further education while admitted.     Aspiration Risk   (reduced; increased from an Esoophageal phase view)    Diet Recommendation  mech soft/regular diet(meats cut well, moistened); thin liquids. Strict REFLUX precaution; general aspiration precaution.   Medication Administration: Whole meds with liquid (if clearing through the Esophagus)    Other  Recommendations Recommended  Consults: Consider GI evaluation;Consider esophageal assessment Oral Care Recommendations: Oral care BID;Patient independent with oral care   Follow up Recommendations  None    Frequency and Duration min 2x/week  1 week       Prognosis Prognosis for Safe Diet Advancement: Fair      Swallow Study   General Date of Onset: 10/01/15 HPI: Pt is a 82 y.o. female with a known  history of many medical issues including CAD, DM, CHF, Hiatal Hernia, Morbid Obesity, HTN, thyroid dis., and diastolic heart failure and pEF by ECHO 02/2015 Who presents with dyspnea on exertion, lower extremity edema and shortness of breath over the past 3 days. Patient reports compliance with her medications and diet. Pt does endorse periodic episodes of regurgitation and Reflux symptoms. Pt described feelings of fullness "almost cutting off my breath" to which she feels the solution is to regurgitate to "relieve the pressure so that I can breath again". Suspect this presentation could be increasing risk for aspiration of Reflux material thus impacting her Pulmonary status.  Type of Study: Bedside Swallow Evaluation Previous Swallow Assessment: none Diet Prior to this Study: Regular;Thin liquids Temperature Spikes Noted: No (wbc 16.3) Respiratory Status: Room air History of Recent Intubation: No Behavior/Cognition: Alert;Cooperative;Pleasant mood Oral Cavity Assessment: Within Functional Limits Oral Care Completed by SLP: Recent completion by staff Oral Cavity - Dentition: Adequate natural dentition Vision: Functional for self-feeding Self-Feeding Abilities: Able to feed self Patient Positioning: Upright in chair Baseline Vocal Quality: Normal Volitional Cough: Strong Volitional Swallow: Able to elicit    Oral/Motor/Sensory Function Overall Oral Motor/Sensory Function: Within functional limits   Ice Chips Ice chips: Not tested   Thin Liquid Thin Liquid: Within functional limits Presentation: Cup;Self Fed;Straw (3-4 trials via each)    Nectar Thick Nectar Thick Liquid: Not tested   Honey Thick Honey Thick Liquid: Not tested   Puree Puree: Within functional limits Presentation: Self Fed;Spoon (4 trials)   Solid   GO   Solid: Not tested Other Comments: declined until dinner       Orinda Kenner, MS, CCC-SLP  Lygia Olaes 10/09/2015,5:20 PM

## 2015-10-09 NOTE — Progress Notes (Signed)
Amy [name] at [place] NAME: Amy [name]    MR#:  [id number]  DATE OF BIRTH:  12-Jan-1928  SUBJECTIVE:   Still having significant cough, Bronchospasm today. Still having significant exertional shortness of breath. CT chest noncontrast obtained which showing possible mucous plugging.    REVIEW OF SYSTEMS:    Review of Systems  Constitutional: Negative for chills and fever.  HENT: Negative for congestion and tinnitus.   Eyes: Negative for blurred vision and double vision.  Respiratory: Positive for cough, shortness of breath and wheezing.   Cardiovascular: Negative for chest pain, orthopnea and PND.  Gastrointestinal: Negative for abdominal pain, diarrhea, nausea and vomiting.  Genitourinary: Negative for dysuria and hematuria.  Neurological: Negative for dizziness, sensory change and focal weakness.  All other systems reviewed and are negative.   Nutrition: Heart healthy/Carb control Tolerating Diet: Yes Tolerating PT: Eval noted.    DRUG ALLERGIES:   Allergies  Allergen Reactions  . Atenolol     Other reaction(s): Other (See Comments) Decreased heart rate  . Codeine     Rash, difficulty breathing, nausea.    VITALS:  Blood pressure (!) 154/47, pulse 83, temperature 97.9 F (36.6 C), temperature source Oral, resp. rate (!) 22, height '5\' 2"'$  (1.575 m), weight 113.4 kg (250 lb), SpO2 91 %.  PHYSICAL EXAMINATION:   Physical Exam  GENERAL:  82 y.o.-year-old patient sitting up in chair in mild resp. distress.    EYES: Pupils equal, round, reactive to light and accommodation. No scleral icterus. Extraocular muscles intact.  HEENT: Head atraumatic, normocephalic. Oropharynx and nasopharynx clear.  NECK:  Supple, no jugular venous distention. No thyroid enlargement, no tenderness.  LUNGS: Diffuse rhonchi and wheezing bilaterally, good air entry bilaterally, + coughing paroxysms. Positive use of accessory muscles. CARDIOVASCULAR:  S1, S2 normal. No murmurs, rubs, or gallops.  ABDOMEN: Soft, nontender, nondistended. Bowel sounds present. No organomegaly or mass.  EXTREMITIES: No cyanosis, clubbing, + 1 edema b/l    NEUROLOGIC: Cranial nerves II through XII are intact. No focal Motor or sensory deficits b/l. Globally weak.  PSYCHIATRIC: The patient is alert and oriented x 3.  SKIN: No obvious rash, lesion, or ulcer.    LABORATORY PANEL:   CBC  Recent Labs Lab 10/08/15 0537  WBC 16.3*  HGB 10.1*  HCT 30.3*  PLT 259   ------------------------------------------------------------------------------------------------------------------  Chemistries   Recent Labs Lab 10/04/15 0337  10/07/15 0512 10/08/15 0448  NA 137  < > 139  --   K 4.3  < > 4.8  --   CL 102  < > 103  --   CO2 26  < > 27  --   GLUCOSE 195*  < > 266*  --   BUN 73*  < > 61*  --   CREATININE 1.80*  < > 1.53* 1.54*  CALCIUM 8.1*  < > 8.4*  --   MG 2.2  --   --   --   < > = values in this interval not displayed. ------------------------------------------------------------------------------------------------------------------  Cardiac Enzymes No results for input(s): TROPONINI in the last 168 hours. ------------------------------------------------------------------------------------------------------------------  RADIOLOGY:  Dg Chest 1 View  Result Date: 10/08/2015 CLINICAL DATA:  82 year old female with shortness of breath chest pain and lower extremity swelling. Initial encounter. EXAM: CHEST 1 VIEW COMPARISON:  Chest CTA 10/01/2015 and earlier. FINDINGS: Portable AP upright view at 0618 hours. Moderate size gastric hiatal hernia is less apparent. Stable cardiac size and mediastinal contours. Calcified aortic atherosclerosis.  Visualized tracheal air column is within normal limits. Regressed pulmonary vascularity since 10/01/2015. No pneumothorax. The right lung appears clear. However, there is new retrocardiac opacity at the left lung base  partially obscuring the diaphragm. IMPRESSION: 1. New confluent left lung base opacity might reflect atelectasis, but is suspicious for aspiration or infection. 2.  Calcified aortic atherosclerosis. Electronically Signed   By: Genevie Ann M.D.   On: 10/08/2015 07:14   Ct Chest Wo Contrast  Result Date: 10/09/2015 CLINICAL DATA:  Shortness of breath, cough for a week EXAM: CT CHEST WITHOUT CONTRAST TECHNIQUE: Multidetector CT imaging of the chest was performed following the standard protocol without IV contrast. COMPARISON:  Chest x-ray of 10/08/2015 FINDINGS: Cardiovascular: The mid ascending thoracic aorta measures 35 mm in diameter. There is calcification and probable stent within the left anterior descending coronary artery. The heart is within upper limits of normal. No pericardial effusion is seen. Calcification of the mitral annulus is noted Mediastinum/Nodes: No mediastinal or hilar adenopathy is seen. A pre trachea node is present of 10 mm in short axis diameter. A small to moderate size hiatal hernia is present. Lungs/Pleura: Biapical pleural-parenchymal scarring is noted. There is parenchymal opacity within the deep posterior medial left lower lobe most likely due to atelectasis or possibly a patchy area of pneumonia. In addition there is atelectasis and possible patchy pneumonia within the right middle lobe with air bronchograms present. The bronchus to the right middle lobe appears narrowed and partially occluded possibly due to a mucous plugging, but an endobronchial lesion cannot be excluded. Clinical followup and possible bronchoscopy may be helpful. Within the left lower lobe there is a 6 mm noncalcified nodule present on image 82. A 5 mm nodule is also noted within the left lower lobe on image 79. Series 3. A slight protrusion from the right superior mediastinum on image 25 appears to represent fat and is of doubtful significance. Upper Abdomen: There is calcification noted within the upper pole of  the left kidney which could be dystrophic, but a calcified renal lesion cannot be excluded. Correlate clinically and consider CT the abdomen pelvis if warranted with IV contrast media. Musculoskeletal: The thoracic vertebrae are in normal alignment with diffuse degenerative spurring and degenerative disc disease. IMPRESSION: 1. Parenchymal opacity within the deep left lower lobe as well as within the right middle lobe some which may be due to atelectasis, but pneumonia cannot be excluded. 2. Occlusion of the bronchus to the right middle lobe may represent mucous plugging although and endobronchial lesion cannot be excluded. 3. 6 mm and 5 mm noncalcified nodules within the left lower lobe. Non-contrast chest CT at 3-6 months is recommended. If the nodules are stable at time of repeat CT, then future CT at 18-24 months (from today's scan) is considered optional for low-risk patients, but is recommended for high-risk patients. This recommendation follows the consensus statement: Guidelines for Management of Incidental Pulmonary Nodules Detected on CT Images:From the Fleischner Society 2017; published online before print (10.1148/radiol.5732202542). Calcification in the upper pole of the left kidney on the last few images. This could be dystrophic, but a calcified renal lesion cannot be excluded. Correlate clinically and consider CT of the abdomen pelvis with IV contrast. 4. Moderate thoracic aortic atherosclerosis. Electronically Signed   By: Ivar Drape M.D.   On: 10/09/2015 12:08     ASSESSMENT AND PLAN:   82 year old female with past medical history of diabetes, chronic kidney disease stage III, chronic diastolic CHF, COPD, hypothyroidism, essential  hypertension who presented to the hospital due to shortness of breath.  1. Acute on chronic respiratory failure with hypoxia-secondary to COPD exacerbation.  -Continue IV steroids, continue scheduled DuoNeb's, Pulmicort nebs. Cont. Flutter device.  Slow to  improve.  -CT chest noncontrast obtained today showing mucous plugging.  Pulm. Consult obtained and as per them no plan for bronch now and cont. Aggressive therapy as mentioned above.  Consider Bronch as outpatient -Wean off oxygen as tolerated.  2. COPD exacerbation-continue IV steroids, scheduled DuoNeb's, Pulmicort nebs, Tussionex, Flutter device. - CT chest showing ?? Mucus plugging but no need for bronch presently as per Pulmonary.   - cont. Ceftriaxone, Zithromax for now.    - slow to improve and cont. Aggressive Pulm. Toileting.   3. Pneumoina - cont.  IV Ceftriaxone, Zithromax.  - follow cultures.  Follow clinically.   4. Chronic diastolic CHF-clinically patient is not congestive heart failure. -Appreciate cardiology input and continue home dose Lasix, Clonidine.   4. Essential HTN - cont. Clonidine Patch, Hydralazine, Norvasc, Losartan and will monitor. BP improved.   5. DM - cont. SSI.  - hold Metformin.  Cont. Amaryl.  Cont. Carb control diet.  - follow BS closely as she is on IV steroids    6. Hypothyroidism - cont. Synthroid.   7. Urinary Incontinence - cont. Oxybutynin.   8. Hyperlipidemia - cont. Simvastatin.    All the records are reviewed and case discussed with Care Management/Social Workerr. Management plans discussed with the patient, family and they are in agreement.  CODE STATUS: Full Code  DVT Prophylaxis: Lovenox  TOTAL TIME TAKING CARE OF THIS PATIENT: 25 minutes.   POSSIBLE D/C IN 1-2 DAYS, DEPENDING ON CLINICAL CONDITION.   Henreitta Leber M.D on 10/09/2015 at 4:03 PM  Between 7am to 6pm - Pager - [phone]  After 6pm go to [email or web address] - password [email or web address] [facility]  [facility] [place] [facility]  Office  [phone]  CC: Primary care physician; Leonel Ramsay, MD

## 2015-10-09 NOTE — Consult Note (Signed)
[facility] Consultation      Assessment and Plan:  Atelectasis.  -Review CT of the chest shows atelectasis of the medial segment of the right middle lobe. This is most likely due to external compression, however an endobronchial tumor cannot be ruled out based on the scan. -Patient's oxygen saturation is currently adequate at room air, I don't think there is an acute need for bronchoscopy at this time. -She was encouraged to use her incentive spirometry and flutter valve. More often. In addition, advancing her activity could help resolve her atelectasis. -Given the presence of lung nodules, atelectasis, paratracheal lymphadenopathy, the patient may require bronchoscopy in the future. She was given my card today. She'll be asked to follow up with [place] in the office in 6-8 weeks' time with repeat CAT scan. However, as the patient is on Plavix, she would need to be off of this for one week before considering any interventional procedure.  Acute bronchitis.  -Continue antibiotics.  Hiatal hernia/intrathoracic stomach. -Patient has a significant hiatal hernia, which is likely contributing to dyspnea and atelectasis. -The patient is likely at high risk of aspiration, continue to treat with antireflux measures.  CHF with cardiomegaly. -Patient has significant cardiomegaly noted on CT of the chest, this likely attributing to atelectasis.  Obesity. -BMI = 46 -Likely contributing to dyspnea, weight loss would be beneficial for her dyspnea. -Obesity is likely contributing to atelectasis due to external compression from chest wall and diaphragm.  Dyspnea. -The patient's dyspnea is likely multifactorial from cardiomegaly with chronic diastolic congestive heart failure, bronchitis, atelectasis, morbid obesity, deconditioning. -The patient would benefit from continued physical therapy, and advancing activity.   Date: 10/09/2015  MRN# [id number] Amy [name]  [birth year]  Referring Physician: Dr. Verdell Carmine.   Blandina C Panebianco is a 82 y.o. old female seen in consultation for chief complaint of:    Chief Complaint  Patient presents with  . Angioedema  . Chest Pain  . Shortness of Breath    HPI:   Patient is a 82 year old female, she is a nonsmoker but lived with a smoker in the past. She was admitted with symptoms of acute bronchitis and congestive heart failure on 8/7. Since that time she has made slow progress, she is slowly been weaned down on her oxygen, she is now weaned off oxygen at rest. She is continuing to have significant exertional dyspnea when working with physical therapy. She had a repeat CAT scan which showed partial atelectasis of the right middle lobe.  Review of CT chest images 10/09/15 and a comparison with previous one week prior, there is new area of atelectasis in the medial segment of the right middle lobe, likely from external compression, I do not see any evidence of a endobronchial lesion. There is also subcarinal and paratracheal lymphadenopathy. There is a large hiatal hernia with intrathoracic stomach, there is also significant cardiomegaly.   PMHX:   Past Medical History:  Diagnosis Date  . Bladder incontinence   . CAD (coronary artery disease)    a. cardiac cath 05/2009: proximal LAD 90% stenosis s/p PCI/Xience 2.75 x 12 mm DES, mid LAD 40%, diagonal 40%, proximal LCx 40% followed by 40% LCx lesion, 30%-40%-30% lesion noted in the RCA with distal RCA 30% and 25% lesions  . Chronic diastolic (congestive) heart failure ([place])    a. Lexiscan 2013: no ischemia, normal EF b. echo 09/2015: EF 55-60% w/ Grade 1 DD  . CKD (chronic kidney disease) stage 3, GFR 30-59 ml/min   .  Degenerative arthritis of knee    bilateral knees  . Diabetes mellitus    Type II  . Hiatal hernia   . Hypertension   . Iron deficiency   . Menopausal symptoms   . Morbid obesity ([name])   . Thyroid disease    hypothyroidism   Surgical Hx:   Past Surgical History:  Procedure Laterality Date  . COLONOSCOPY  2015  . REPLACEMENT TOTAL KNEE BILATERAL    . TOTAL VAGINAL HYSTERECTOMY     ovarian mass, not cancerous  . UPPER GI ENDOSCOPY  2015   Family Hx:  Family History  Problem Relation Age of Onset  . Breast cancer Mother    Social Hx:   Social History  Substance Use Topics  . Smoking status: Never Smoker  . Smokeless tobacco: Never Used  . Alcohol use No   Medication:       Allergies:  Atenolol and Codeine  Review of Systems: Gen:  Denies  fever, sweats, chills HEENT: Denies blurred vision, double vision. bleeds, sore throat Cvc:  No dizziness, chest pain. Resp:   Denies cough or sputum production, Hemoptysis Other:  All other systems were reviewed with the patient and were negative other that what is mentioned in the HPI.   Physical Examination:   VS: BP (!) 154/47 (BP Location: Right Arm)   Pulse 83   Temp 97.9 F (36.6 C) (Oral)   Resp (!) 22   Ht '5\' 2"'$  (1.575 m)   Wt 250 lb (113.4 kg)   SpO2 91%   BMI 45.73 kg/m   General Appearance: No distress , sitting up in a chair. Neuro:without focal findings,  speech normal,  HEENT: PERRLA, EOM intact.   Pulmonary: normal breath sounds, No wheezing. Decreased air entry in both bases. CardiovascularNormal S1,S2.  No m/r/g.   Abdomen: Benign, Soft, non-tender. Renal:  No costovertebral tenderness  GU:  No performed at this time. Endoc: No evident thyromegaly, no signs of acromegaly. Skin:   warm, no rashes, no ecchymosis  Extremities: normal, no cyanosis, clubbing.  Other findings:    LABORATORY PANEL:   CBC  Recent Labs Lab 10/08/15 0537  WBC 16.3*  HGB 10.1*  HCT 30.3*  PLT 259   ------------------------------------------------------------------------------------------------------------------  Chemistries   Recent Labs Lab 10/04/15 0337  10/07/15 0512 10/08/15 0448  NA 137  < > 139  --   K 4.3  < > 4.8  --   CL 102  < > 103   --   CO2 26  < > 27  --   GLUCOSE 195*  < > 266*  --   BUN 73*  < > 61*  --   CREATININE 1.80*  < > 1.53* 1.54*  CALCIUM 8.1*  < > 8.4*  --   MG 2.2  --   --   --   < > = values in this interval not displayed. ------------------------------------------------------------------------------------------------------------------  Cardiac Enzymes No results for input(s): TROPONINI in the last 168 hours. ------------------------------------------------------------  RADIOLOGY:  Dg Chest 1 View  Result Date: 10/08/2015 CLINICAL DATA:  82 year old female with shortness of breath chest pain and lower extremity swelling. Initial encounter. EXAM: CHEST 1 VIEW COMPARISON:  Chest CTA 10/01/2015 and earlier. FINDINGS: Portable AP upright view at 0618 hours. Moderate size gastric hiatal hernia is less apparent. Stable cardiac size and mediastinal contours. Calcified aortic atherosclerosis. Visualized tracheal air column is within normal limits. Regressed pulmonary vascularity since 10/01/2015. No pneumothorax. The right lung appears clear. However, there  is new retrocardiac opacity at the left lung base partially obscuring the diaphragm. IMPRESSION: 1. New confluent left lung base opacity might reflect atelectasis, but is suspicious for aspiration or infection. 2.  Calcified aortic atherosclerosis. Electronically Signed   By: Genevie Ann M.D.   On: 10/08/2015 07:14   Ct Chest Wo Contrast  Result Date: 10/09/2015 CLINICAL DATA:  Shortness of breath, cough for a week EXAM: CT CHEST WITHOUT CONTRAST TECHNIQUE: Multidetector CT imaging of the chest was performed following the standard protocol without IV contrast. COMPARISON:  Chest x-ray of 10/08/2015 FINDINGS: Cardiovascular: The mid ascending thoracic aorta measures 35 mm in diameter. There is calcification and probable stent within the left anterior descending coronary artery. The heart is within upper limits of normal. No pericardial effusion is seen. Calcification  of the mitral annulus is noted Mediastinum/Nodes: No mediastinal or hilar adenopathy is seen. A pre trachea node is present of 10 mm in short axis diameter. A small to moderate size hiatal hernia is present. Lungs/Pleura: Biapical pleural-parenchymal scarring is noted. There is parenchymal opacity within the deep posterior medial left lower lobe most likely due to atelectasis or possibly a patchy area of pneumonia. In addition there is atelectasis and possible patchy pneumonia within the right middle lobe with air bronchograms present. The bronchus to the right middle lobe appears narrowed and partially occluded possibly due to a mucous plugging, but an endobronchial lesion cannot be excluded. Clinical followup and possible bronchoscopy may be helpful. Within the left lower lobe there is a 6 mm noncalcified nodule present on image 82. A 5 mm nodule is also noted within the left lower lobe on image 79. Series 3. A slight protrusion from the right superior mediastinum on image 25 appears to represent fat and is of doubtful significance. Upper Abdomen: There is calcification noted within the upper pole of the left kidney which could be dystrophic, but a calcified renal lesion cannot be excluded. Correlate clinically and consider CT the abdomen pelvis if warranted with IV contrast media. Musculoskeletal: The thoracic vertebrae are in normal alignment with diffuse degenerative spurring and degenerative disc disease. IMPRESSION: 1. Parenchymal opacity within the deep left lower lobe as well as within the right middle lobe some which may be due to atelectasis, but pneumonia cannot be excluded. 2. Occlusion of the bronchus to the right middle lobe may represent mucous plugging although and endobronchial lesion cannot be excluded. 3. 6 mm and 5 mm noncalcified nodules within the left lower lobe. Non-contrast chest CT at 3-6 months is recommended. If the nodules are stable at time of repeat CT, then future CT at 18-24 months  (from today's scan) is considered optional for low-risk patients, but is recommended for high-risk patients. This recommendation follows the consensus statement: Guidelines for Management of Incidental Pulmonary Nodules Detected on CT Images:From the Fleischner Society 2017; published online before print (10.1148/radiol.[id number]). Calcification in the upper pole of the left kidney on the last few images. This could be dystrophic, but a calcified renal lesion cannot be excluded. Correlate clinically and consider CT of the abdomen pelvis with IV contrast. 4. Moderate thoracic aortic atherosclerosis. Electronically Signed   By: Ivar Drape M.D.   On: 10/09/2015 12:08       Thank  you for the consultation and for allowing [facility] to assist in the care of your patient. Our recommendations are noted above.  Please contact us if we can be of further service.   Marda Stalker, MD.  Board Certified in Internal Medicine, Pulmonary Medicine, [place], and Sleep Medicine.  [facility] Office Number: [phone]  Patricia Pesa, M.D.  Vilinda Boehringer, M.D.  Merton Border, M.D  10/09/2015

## 2015-10-09 NOTE — Telephone Encounter (Signed)
-----   Message from Laverle Hobby, MD sent at 10/09/2015  3:56 PM EDT ----- Regarding: hfu Pt needs a repeat CT chest with contrast in about 8 weeks, then follow up with me.

## 2015-10-09 NOTE — Care Management (Signed)
Barrier to DC: PO antibiotics changed to IV antibiotics, IV steroids. Wheezing. Slow to make progress.

## 2015-10-09 NOTE — Progress Notes (Signed)
Inpatient Diabetes Program Recommendations  AACE/ADA: New Consensus Statement on Inpatient Glycemic Control (2015)  Target Ranges:  Prepandial:   less than 140 mg/dL      Peak postprandial:   less than 180 mg/dL (1-2 hours)      Critically ill patients:  140 - 180 mg/dL   Results for Amy [name], Amy [name] (MRN [id number]) as of 10/09/2015 09:13  Ref. Range 10/08/2015 07:43 10/08/2015 11:14 10/08/2015 16:08 10/08/2015 21:09 10/09/2015 07:35  Glucose-Capillary Latest Ref Range: 65 - 99 mg/dL 275 (H) 295 (H) 243 (H) 349 (H) 262 (H)   Review of Glycemic Control  Diabetes history: DM2 Outpatient Diabetes medications: Actos 15 mg daily, Metformin 1000 mg bid, Amaryl 4 mg daily Current orders for Inpatient glycemic control: Amaryl 4 mg QAM, Novolog 0-20 units TID with meals, Novolog 0-5 units QHS  Inpatient Diabetes Program Recommendations: Insulin - Basal: Glucose ranged from 243-349 mg/dl over the past 24 hours. Please consider ordering low dose basal insulin while ordered steroids. Recommend starting with Lantus 12 units Q24H (based on 116 kg x 0.1 units). Please note that if Lantus is ordered as recommended, once steroids are tapered Lantus will need to be adjusted accordingly. Insulin - Meal Coverage: If steroids are continued, please consider ordering Novolog 5 units TID with meals for meal coverage  Thanks, Barnie Alderman, RN, MSN, CDE Diabetes Coordinator Inpatient Diabetes Program [phone] (Team Pager from [place] to [place]) [phone] ([facility] office) [phone] [facility] office) [phone] [facility] office)

## 2015-10-10 ENCOUNTER — Telehealth: Payer: Self-pay | Admitting: *Deleted

## 2015-10-10 DIAGNOSIS — J209 Acute bronchitis, unspecified: Secondary | ICD-10-CM

## 2015-10-10 DIAGNOSIS — R0602 Shortness of breath: Secondary | ICD-10-CM

## 2015-10-10 DIAGNOSIS — R918 Other nonspecific abnormal finding of lung field: Secondary | ICD-10-CM

## 2015-10-10 LAB — GLUCOSE, CAPILLARY
GLUCOSE-CAPILLARY: 233 mg/dL — AB (ref 65–99)
Glucose-Capillary: 329 mg/dL — ABNORMAL HIGH (ref 65–99)
Glucose-Capillary: 278 mg/dL — ABNORMAL HIGH (ref 65–99)
Glucose-Capillary: 315 mg/dL — ABNORMAL HIGH (ref 65–99)

## 2015-10-10 LAB — HEMOGLOBIN: Hemoglobin: 9.8 g/dL — ABNORMAL LOW (ref 12.0–16.0)

## 2015-10-10 MED ORDER — INSULIN ASPART 100 UNIT/ML ~~LOC~~ SOLN: 4.0000 [IU] | Freq: Three times a day (TID) | SUBCUTANEOUS | Status: DC

## 2015-10-10 MED ORDER — METHYLPREDNISOLONE SODIUM SUCC 40 MG IJ SOLR
40.0000 mg | INTRAMUSCULAR | Status: DC
  Administered 2015-10-11: 40 mg via INTRAVENOUS
  Filled 2015-10-10: qty 1

## 2015-10-10 MED ORDER — INSULIN ASPART 100 UNIT/ML ~~LOC~~ SOLN
5.0000 [IU] | Freq: Three times a day (TID) | SUBCUTANEOUS | Status: DC
  Administered 2015-10-10 – 2015-10-11 (×3): 5 [IU] via SUBCUTANEOUS
  Filled 2015-10-10 (×3): qty 5

## 2015-10-10 MED ORDER — INSULIN GLARGINE 100 UNIT/ML ~~LOC~~ SOLN
12.0000 [IU] | Freq: Every day | SUBCUTANEOUS | Status: DC
  Administered 2015-10-10 – 2015-10-11 (×2): 12 [IU] via SUBCUTANEOUS
  Filled 2015-10-10 (×2): qty 0.12

## 2015-10-10 MED ORDER — AZITHROMYCIN 250 MG PO TABS
250.0000 mg | ORAL_TABLET | Freq: Every day | ORAL | Status: DC
  Administered 2015-10-11: 250 mg via ORAL
  Filled 2015-10-10: qty 1

## 2015-10-10 NOTE — Telephone Encounter (Signed)
Spoke with Vicente Males on the unit and gave an appt for pt for 10/26/15 @ 11am with DR. Nothing further needed.

## 2015-10-10 NOTE — Progress Notes (Signed)
Patient's abdomen/arms extremely bruised from subq injections. No other signs of bleeding. Most recent labs from 8/14 with stable Hgb and platelets. Patient is on ASA, plavix and lovenox. Dr. Verdell Carmine aware - order to stop lovenox and start TEDs/SCDs and order a STAT Hgb.

## 2015-10-10 NOTE — Progress Notes (Signed)
Speech Language Pathology Treatment: Dysphagia  Patient Details Name: NIRALI MAGOUIRK MRN: [id number] DOB: 29-Mar-1927 Today's Date: 10/10/2015 Time: 1225-1300 SLP Time Calculation (min) (ACUTE ONLY): 35 min  Assessment / Plan / Recommendation Clinical Impression  Pt appears to be tolerating her current mech soft diet w/ thin liquids following Reflux precautions and general aspiration precautions. Pt consumed trials of soft solids and thin liquids w/ no overt s/s of aspiration; oral phase wfl for bolus management. Discussed w/ pt the necessary guidelines to follow for Esophageal Dysmotility including monitoring for fullness, smaller more frequent meals, head of bed elevated at all times, not lying down after meal for ~2-3 hours, etc. Handouts provided on these and other guidelines for Esophageal Dysmotility and Reflux precautions as well as diet consistency recommendation(dysphagia 3-mech soft). Options of foods and food preparation discussed and suggestions given. Pt gave verbal agreement to information provided stating some insight into her Esophageal dysphagia. Strongly recommend f/u w/ GI for ongoing management and education. NSG to reconsult ST services if any change in status while admitted. NSG updated. Pt agreed.    HPI HPI: Pt is a 82 y.o. female with a known history of many medical issues including CAD, DM, CHF, Hiatal Hernia, Morbid Obesity, HTN, thyroid dis., and diastolic heart failure and pEF by ECHO 02/2015 Who presents with dyspnea on exertion, lower extremity edema and shortness of breath over the past 3 days. Patient reports compliance with her medications and diet. Pt does endorse periodic episodes of regurgitation and Reflux symptoms. Pt described feelings of fullness "almost cutting off my breath" to which she feels the solution is to regurgitate to "relieve the pressure so that I can breath again". Suspect this presentation could be increasing risk for aspiration of Reflux material  thus impacting her Pulmonary status.       SLP Plan  All goals met     Recommendations  Diet recommendations: Dysphagia 3 (mechanical soft);Thin liquid Liquids provided via: Cup;Straw Medication Administration: Whole meds with liquid (in puree if easier for Esophageal clearing) Supervision: Patient able to self feed Compensations: Minimize environmental distractions;Slow rate;Small sips/bites;Multiple dry swallows after each bite/sip;Follow solids with liquid Postural Changes and/or Swallow Maneuvers: Seated upright 90 degrees;Upright 30-60 min after meal (Reflux precautions)             General recommendations:  (Dietician f/u) Oral Care Recommendations: Oral care BID;Patient independent with oral care Follow up Recommendations: None Plan: All goals met     GO               Orinda Kenner, MS, CCC-SLP  Watson,Katherine 10/10/2015, 3:21 PM

## 2015-10-10 NOTE — Consult Note (Signed)
[facility] Consultation      Assessment and Plan:  Atelectasis.  -Review CT of the chest shows atelectasis of the medial segment of the right middle lobe. This is most likely due to external compression, however an endobronchial tumor cannot be ruled out based on the scan - outpatient CT Scan, and follow up with Pulmonary. - no need for bronchoscopy at this time - cont with incentive spirometry and flutter valve - stable to discharge  Acute bronchitis.  -Continue antibiotics. - non smoker - still with some wheezing, can wean of inhaled steroids, and use PRN albuterol upon discharge.  At this point PRN albuterol, incentive spriometry, short steroid taper, and abx will be beneficial - Prednisone taper, '40mg'$  over 10 days, prn albuterol, can transition to Augmentin '500mg'$  PO BID x 7-10 days.   Hiatal hernia/intrathoracic stomach. -Patient has a significant hiatal hernia, which is likely contributing to dyspnea and atelectasis. -The patient is likely at high risk of aspiration, continue to treat with antireflux measures.  CHF with cardiomegaly. -Patient has significant cardiomegaly noted on CT of the chest, this likely attributing to atelectasis.  Obesity. -BMI = 46 -Likely contributing to dyspnea, weight loss would be beneficial for her dyspnea. -Obesity is likely contributing to atelectasis due to external compression from chest wall and diaphragm.  Dyspnea. -The patient's dyspnea is likely multifactorial from cardiomegaly with chronic diastolic congestive heart failure, bronchitis, atelectasis, morbid obesity, deconditioning. -The patient would benefit from continued physical therapy, and advancing activity.  Pulmonary clinic will call patient with follow up appointment.   Thank you for consulting [facility], we will signoff at this time.  Please feel free to contact us with any questions at [phone] (please enter  7-digits).    Date: 10/10/2015  MRN# [id number] Amy [name] 08/14/1927  Referring Physician: Dr. Verdell Carmine.   Amy [name] is a 82 y.o. old female seen in consultation for chief complaint of:    Chief Complaint  Patient presents with  . Angioedema  . Chest Pain  . Shortness of Breath    HPI:   Patient is a 82 year old female, she is a nonsmoker but lived with a smoker in the past. She was admitted with symptoms of acute bronchitis and congestive heart failure on 8/7. Since that time she has made slow progress, she is slowly been weaned down on her oxygen, she is now weaned off oxygen at rest. She is continuing to have significant exertional dyspnea when working with physical therapy. She had a repeat CAT scan which showed partial atelectasis of the right middle lobe.  Review of CT chest images 10/09/15 and a comparison with previous one week prior, there is new area of atelectasis in the medial segment of the right middle lobe, likely from external compression, I do not see any evidence of a endobronchial lesion. There is also subcarinal and paratracheal lymphadenopathy. There is a large hiatal hernia with intrathoracic stomach, there is also significant cardiomegaly.   SUBJECTIVE: Patient with improvement today, states that cough and breathing is better today, plans for discharge to rehab in the next 24 hours.   PMHX:   Past Medical History:  Diagnosis Date  . Bladder incontinence   . CAD (coronary artery disease)    a. cardiac cath 05/2009: proximal LAD 90% stenosis s/p PCI/Xience 2.75 x 12 mm DES, mid LAD 40%, diagonal 40%, proximal LCx 40% followed by 40% LCx lesion, 30%-40%-30% lesion noted in the RCA with distal  RCA 30% and 25% lesions  . Chronic diastolic (congestive) heart failure ([place])    a. Lexiscan 2013: no ischemia, normal EF b. echo 09/2015: EF 55-60% w/ Grade 1 DD  . CKD (chronic kidney disease) stage 3, GFR 30-59 ml/min   . Degenerative arthritis of knee     bilateral knees  . Diabetes mellitus    Type II  . Hiatal hernia   . Hypertension   . Iron deficiency   . Menopausal symptoms   . Morbid obesity ([place])   . Thyroid disease    hypothyroidism   Surgical Hx:  Past Surgical History:  Procedure Laterality Date  . COLONOSCOPY  2015  . REPLACEMENT TOTAL KNEE BILATERAL    . TOTAL VAGINAL HYSTERECTOMY     ovarian mass, not cancerous  . UPPER GI ENDOSCOPY  2015   Family Hx:  Family History  Problem Relation Age of Onset  . Breast cancer Mother    Social Hx:   Social History  Substance Use Topics  . Smoking status: Never Smoker  . Smokeless tobacco: Never Used  . Alcohol use No   Medication:       Allergies:  Atenolol and Codeine  Review of Systems: Gen:  Denies  fever, sweats, chills HEENT: Denies blurred vision, double vision. bleeds, sore throat Cvc:  No dizziness, chest pain. Resp:   Mild cough and wheezing Other:  All other systems were reviewed with the patient and were negative other that what is mentioned in the HPI.   Physical Examination:   VS: BP (!) 156/45 (BP Location: Left Arm)   Pulse 88   Temp 98.1 F (36.7 C) (Oral)   Resp 20   Ht '5\' 2"'$  (1.575 m)   Wt 246 lb 9.4 oz (111.8 kg)   SpO2 97%   BMI 45.10 kg/m   General Appearance: No distress , sitting up in a chair. Neuro:without focal findings,  speech normal,  HEENT: PERRLA, EOM intact.   Pulmonary: good air entry, mild exp wheezes in the upper airways, dec basilar BS CardiovascularNormal S1,S2.  No m/r/g.   Abdomen: Benign, Soft, non-tender. Renal:  No costovertebral tenderness  GU:  No performed at this time. Endoc: No evident thyromegaly, no signs of acromegaly. Skin:   warm, no rashes, no ecchymosis  Extremities: normal, no cyanosis, clubbing.  Other findings:    LABORATORY PANEL:   CBC  Recent Labs Lab 10/08/15 0537  WBC 16.3*  HGB 10.1*  HCT 30.3*  PLT 259    ------------------------------------------------------------------------------------------------------------------  Chemistries   Recent Labs Lab 10/04/15 0337  10/07/15 0512 10/08/15 0448  NA 137  < > 139  --   K 4.3  < > 4.8  --   CL 102  < > 103  --   CO2 26  < > 27  --   GLUCOSE 195*  < > 266*  --   BUN 73*  < > 61*  --   CREATININE 1.80*  < > 1.53* 1.54*  CALCIUM 8.1*  < > 8.4*  --   MG 2.2  --   --   --   < > = values in this interval not displayed. ------------------------------------------------------------------------------------------------------------------  Cardiac Enzymes No results for input(s): TROPONINI in the last 168 hours. ------------------------------------------------------------  RADIOLOGY:  Ct Chest Wo Contrast  Result Date: 10/09/2015 CLINICAL DATA:  Shortness of breath, cough for a week EXAM: CT CHEST WITHOUT CONTRAST TECHNIQUE: Multidetector CT imaging of the chest was performed following the standard protocol  without IV contrast. COMPARISON:  Chest x-ray of 10/08/2015 FINDINGS: Cardiovascular: The mid ascending thoracic aorta measures 35 mm in diameter. There is calcification and probable stent within the left anterior descending coronary artery. The heart is within upper limits of normal. No pericardial effusion is seen. Calcification of the mitral annulus is noted Mediastinum/Nodes: No mediastinal or hilar adenopathy is seen. A pre trachea node is present of 10 mm in short axis diameter. A small to moderate size hiatal hernia is present. Lungs/Pleura: Biapical pleural-parenchymal scarring is noted. There is parenchymal opacity within the deep posterior medial left lower lobe most likely due to atelectasis or possibly a patchy area of pneumonia. In addition there is atelectasis and possible patchy pneumonia within the right middle lobe with air bronchograms present. The bronchus to the right middle lobe appears narrowed and partially occluded possibly due  to a mucous plugging, but an endobronchial lesion cannot be excluded. Clinical followup and possible bronchoscopy may be helpful. Within the left lower lobe there is a 6 mm noncalcified nodule present on image 82. A 5 mm nodule is also noted within the left lower lobe on image 79. Series 3. A slight protrusion from the right superior mediastinum on image 25 appears to represent fat and is of doubtful significance. Upper Abdomen: There is calcification noted within the upper pole of the left kidney which could be dystrophic, but a calcified renal lesion cannot be excluded. Correlate clinically and consider CT the abdomen pelvis if warranted with IV contrast media. Musculoskeletal: The thoracic vertebrae are in normal alignment with diffuse degenerative spurring and degenerative disc disease. IMPRESSION: 1. Parenchymal opacity within the deep left lower lobe as well as within the right middle lobe some which may be due to atelectasis, but pneumonia cannot be excluded. 2. Occlusion of the bronchus to the right middle lobe may represent mucous plugging although and endobronchial lesion cannot be excluded. 3. 6 mm and 5 mm noncalcified nodules within the left lower lobe. Non-contrast chest CT at 3-6 months is recommended. If the nodules are stable at time of repeat CT, then future CT at 18-24 months (from today's scan) is considered optional for low-risk patients, but is recommended for high-risk patients. This recommendation follows the consensus statement: Guidelines for Management of Incidental Pulmonary Nodules Detected on CT Images:From the Fleischner Society 2017; published online before print (10.1148/radiol.0737106269). Calcification in the upper pole of the left kidney on the last few images. This could be dystrophic, but a calcified renal lesion cannot be excluded. Correlate clinically and consider CT of the abdomen pelvis with IV contrast. 4. Moderate thoracic aortic atherosclerosis. Electronically Signed    By: Ivar Drape M.D.   On: 10/09/2015 12:08       Thank  you for the consultation and for allowing [facility] to assist in the care of your patient. Our recommendations are noted above.  Please contact us if we can be of further service.   Pulmonary consult time: 65mns  VVilinda Boehringer MD [facility] Pager [phone](please enter 7-digits) On Call Pager - [phone] (please enter 7-digits) Clinic - [phone]   10/10/2015

## 2015-10-10 NOTE — Telephone Encounter (Signed)
-----   Message from Vilinda Boehringer, MD sent at 10/10/2015  8:16 AM EDT ----- Regarding: HFU Please setup HFU with DR in 2-3 weeks.  27mns visit, if not then 149m visit, DR saw her in the hospital.  Thanks VM

## 2015-10-10 NOTE — Clinical Social Work Note (Addendum)
MSW was informed by bedside nurse that patient had some questions about discharging to SNF tomorrow.  MSW met with patient and one of her other family members to discuss SNF placement at [facility] and what to expect while she is there.  MSW explained to patient about what to expect day of discharge from hospital and how discharge planning will occur at SNF.  MSW to continue to follow patient's progress throughout discharge planning.  Jones Broom. Norval Morton, MSW [phone]  Mon-Fri 8a-4:30p 10/10/2015 4:26 PM

## 2015-10-10 NOTE — Care Management (Signed)
It is anticipated that patient will be discharged tomorrow to SNF.

## 2015-10-10 NOTE — Progress Notes (Signed)
[place] [facility] [place] NAME: Amy [name]    MR#:  [id number]  DATE OF BIRTH:  June 11, 1927  SUBJECTIVE:   Cough, wheezing improved.  Plan for d/c to SNF tomorrow.  No other complaints.    REVIEW OF SYSTEMS:    Review of Systems  Constitutional: Negative for chills and fever.  HENT: Negative for congestion and tinnitus.   Eyes: Negative for blurred vision and double vision.  Respiratory: Positive for cough, shortness of breath and wheezing.   Cardiovascular: Negative for chest pain, orthopnea and PND.  Gastrointestinal: Negative for abdominal pain, diarrhea, nausea and vomiting.  Genitourinary: Negative for dysuria and hematuria.  Neurological: Negative for dizziness, sensory change and focal weakness.  All other systems reviewed and are negative.   Nutrition: Heart healthy/Carb control Tolerating Diet: Yes Tolerating PT: Eval noted.    DRUG ALLERGIES:   Allergies  Allergen Reactions  . Atenolol     Other reaction(s): Other (See Comments) Decreased heart rate  . Codeine     Rash, difficulty breathing, nausea.    VITALS:  Blood pressure (!) 156/45, pulse 88, temperature 98.1 F (36.7 C), temperature source Oral, resp. rate 20, height '5\' 2"'$  (1.575 m), weight 111.8 kg (246 lb 9.4 oz), SpO2 97 %.  PHYSICAL EXAMINATION:   Physical Exam  GENERAL:  82 y.o.-year-old patient sitting up in chair in NAD.   EYES: Pupils equal, round, reactive to light and accommodation. No scleral icterus. Extraocular muscles intact.  HEENT: Head atraumatic, normocephalic. Oropharynx and nasopharynx clear.  NECK:  Supple, no jugular venous distention. No thyroid enlargement, no tenderness.  LUNGS: Diffuse rhonchi and wheezing bilaterally, good air entry bilaterally, + coughing paroxysms. Positive use of accessory muscles. CARDIOVASCULAR: S1, S2 normal. No murmurs, rubs, or gallops.  ABDOMEN: Soft, nontender, nondistended. Bowel sounds present. No  organomegaly or mass.  EXTREMITIES: No cyanosis, clubbing, + 1 edema b/l    NEUROLOGIC: Cranial nerves II through XII are intact. No focal Motor or sensory deficits b/l. Globally weak.  PSYCHIATRIC: The patient is alert and oriented x 3.  SKIN: No obvious rash, lesion, or ulcer.    LABORATORY PANEL:   CBC  Recent Labs Lab 10/08/15 0537 10/10/15 1411  WBC 16.3*  --   HGB 10.1* 9.8*  HCT 30.3*  --   PLT 259  --    ------------------------------------------------------------------------------------------------------------------  Chemistries   Recent Labs Lab 10/04/15 0337  10/07/15 0512 10/08/15 0448  NA 137  < > 139  --   K 4.3  < > 4.8  --   CL 102  < > 103  --   CO2 26  < > 27  --   GLUCOSE 195*  < > 266*  --   BUN 73*  < > 61*  --   CREATININE 1.80*  < > 1.53* 1.54*  CALCIUM 8.1*  < > 8.4*  --   MG 2.2  --   --   --   < > = values in this interval not displayed. ------------------------------------------------------------------------------------------------------------------  Cardiac Enzymes No results for input(s): TROPONINI in the last 168 hours. ------------------------------------------------------------------------------------------------------------------  RADIOLOGY:  Ct Chest Wo Contrast  Result Date: 10/09/2015 CLINICAL DATA:  Shortness of breath, cough for a week EXAM: CT CHEST WITHOUT CONTRAST TECHNIQUE: Multidetector CT imaging of the chest was performed following the standard protocol without IV contrast. COMPARISON:  Chest x-ray of 10/08/2015 FINDINGS: Cardiovascular: The mid ascending thoracic aorta measures 35 mm in diameter. There is  calcification and probable stent within the left anterior descending coronary artery. The heart is within upper limits of normal. No pericardial effusion is seen. Calcification of the mitral annulus is noted Mediastinum/Nodes: No mediastinal or hilar adenopathy is seen. A pre trachea node is present of 10 mm in short axis  diameter. A small to moderate size hiatal hernia is present. Lungs/Pleura: Biapical pleural-parenchymal scarring is noted. There is parenchymal opacity within the deep posterior medial left lower lobe most likely due to atelectasis or possibly a patchy area of pneumonia. In addition there is atelectasis and possible patchy pneumonia within the right middle lobe with air bronchograms present. The bronchus to the right middle lobe appears narrowed and partially occluded possibly due to a mucous plugging, but an endobronchial lesion cannot be excluded. Clinical followup and possible bronchoscopy may be helpful. Within the left lower lobe there is a 6 mm noncalcified nodule present on image 82. A 5 mm nodule is also noted within the left lower lobe on image 79. Series 3. A slight protrusion from the right superior mediastinum on image 25 appears to represent fat and is of doubtful significance. Upper Abdomen: There is calcification noted within the upper pole of the left kidney which could be dystrophic, but a calcified renal lesion cannot be excluded. Correlate clinically and consider CT the abdomen pelvis if warranted with IV contrast media. Musculoskeletal: The thoracic vertebrae are in normal alignment with diffuse degenerative spurring and degenerative disc disease. IMPRESSION: 1. Parenchymal opacity within the deep left lower lobe as well as within the right middle lobe some which may be due to atelectasis, but pneumonia cannot be excluded. 2. Occlusion of the bronchus to the right middle lobe may represent mucous plugging although and endobronchial lesion cannot be excluded. 3. 6 mm and 5 mm noncalcified nodules within the left lower lobe. Non-contrast chest CT at 3-6 months is recommended. If the nodules are stable at time of repeat CT, then future CT at 18-24 months (from today's scan) is considered optional for low-risk patients, but is recommended for high-risk patients. This recommendation follows the  consensus statement: Guidelines for Management of Incidental Pulmonary Nodules Detected on CT Images:From the Fleischner Society 2017; published online before print (10.1148/radiol.6503546568). Calcification in the upper pole of the left kidney on the last few images. This could be dystrophic, but a calcified renal lesion cannot be excluded. Correlate clinically and consider CT of the abdomen pelvis with IV contrast. 4. Moderate thoracic aortic atherosclerosis. Electronically Signed   By: Ivar Drape M.D.   On: 10/09/2015 12:08     ASSESSMENT AND PLAN:   82 year old female with past medical history of diabetes, chronic kidney disease stage III, chronic diastolic CHF, COPD, hypothyroidism, essential hypertension who presented to the hospital due to shortness of breath.  1. Acute on chronic respiratory failure with hypoxia-secondary to COPD exacerbation.  -Continue IV steroids but will taper further, continue scheduled DuoNeb's, Pulmicort nebs. Cont. Flutter device.  -CT chest noncontrast obtained yesterday showing mucous plugging.  Pulm. Consult obtained and as per them no plan for bronch now and cont. Aggressive therapy as mentioned above and improving.  Consider Bronch as outpatient  2. COPD exacerbation-continue IV steroids but will taper further, cont. scheduled DuoNeb's, Pulmicort nebs, Tussionex, Flutter device. - CT chest showing ?? Mucus plugging but no need for bronch presently as per Pulmonary.   - cont. Ceftriaxone, Zithromax for now and will switch to Oral tomorrow abx    - slow to improve and cont.  Aggressive Pulm. Toileting.   3. Pneumoina - cont.  IV Ceftriaxone, Zithromax and will switch to oral tomorrow upon discharge.  - follow cultures.  Follow clinically.   4. Chronic diastolic CHF-clinically patient is not congestive heart failure. -continue home dose Lasix, Clonidine.   4. Essential HTN - cont. Clonidine Patch, Hydralazine, Norvasc, Losartan and will monitor. BP improved.    5. DM - cont. SSI. Will taper steroids.  - Cont. Amaryl.  Cont. Carb control diet. Hold metformin.    - follow BS closely as she is on IV steroids    6. Hypothyroidism - cont. Synthroid.   7. Urinary Incontinence - cont. Oxybutynin.   8. Hyperlipidemia - cont. Simvastatin.    All the records are reviewed and case discussed with Care Management/Social Workerr. Management plans discussed with the patient, family and they are in agreement.  CODE STATUS: Full Code  DVT Prophylaxis: Lovenox  TOTAL TIME TAKING CARE OF THIS PATIENT: 25 minutes.   POSSIBLE D/C IN 1-2 DAYS, DEPENDING ON CLINICAL CONDITION.   Henreitta Leber M.D on 10/10/2015 at 3:18 PM  Between 7am to 6pm - Pager - [phone]  After 6pm go to [email or web address] - password [email or web address]  [facility]  Office  [phone]  CC: Primary care physician; Leonel Ramsay, MD

## 2015-10-10 NOTE — Progress Notes (Signed)
Inpatient Diabetes Program Recommendations  AACE/ADA: New Consensus Statement on Inpatient Glycemic Control (2015)  Target Ranges:  Prepandial:   less than 140 mg/dL      Peak postprandial:   less than 180 mg/dL (1-2 hours)      Critically ill patients:  140 - 180 mg/dL   Results for PRESTINA, RAIGOZA (MRN [id number]) as of 10/10/2015 10:00  Ref. Range 10/09/2015 07:35 10/09/2015 11:51 10/09/2015 16:44 10/09/2015 21:10 10/10/2015 07:25  Glucose-Capillary Latest Ref Range: 65 - 99 mg/dL 262 (H)  Novolog 11 units 290 (H)  Novolog 11 units 247 (H)  Novolog 7 units 343 (H)  Novolog 4 units 329 (H)  Novolog 15 units   Review of Glycemic Control  Diabetes history:DM2 Outpatient Diabetes medications: Actos 15 mg daily, Metformin 1000 mg bid, Amaryl 4 mg daily Current orders for Inpatient glycemic control: Amaryl 4 mg QAM, Novolog 0-20 units TID with meals, Novolog 0-5 units QHS  Inpatient Diabetes Program Recommendations: Insulin - Basal:Glucose ranged from 247-343 mg/dl over the past 24 hours and patient has received a total of Novolog 48 units for correction over the past 24 hours. Please consider ordering low dose basal insulin while ordered steroids. Recommend starting with Lantus 12 units Q24H (based on 116 kg x 0.1 units). Please note that if Lantus is ordered as recommended, once steroids are tapered Lantus will need to be adjusted accordingly. Insulin - Meal Coverage:If steroids are continued, please consider ordering Novolog 5 units TID with meals for meal coverage  Thanks, Barnie Alderman, RN, MSN, CDE Diabetes Coordinator Inpatient Diabetes Program [phone] (Team Pager from [place] to [place]) [phone] ([place] office) [phone] [facility] office) [phone] [facility] office)

## 2015-10-10 NOTE — Plan of Care (Signed)
Problem: SLP Dysphagia Goals Goal: Misc Dysphagia Goal Pt will safely tolerate po diet of least restrictive consistency w/ no overt s/s of aspiration noted by Staff/pt/family x3 sessions.    

## 2015-10-11 ENCOUNTER — Encounter
Admission: RE | Admit: 2015-10-11 | Discharge: 2015-10-11 | Disposition: A | Discharge status: Home / Self Care | Payer: Medicare Other | Source: Ambulatory Visit | Attending: Internal Medicine | Admitting: Internal Medicine

## 2015-10-11 DIAGNOSIS — F329 Major depressive disorder, single episode, unspecified: Secondary | ICD-10-CM | POA: Diagnosis not present

## 2015-10-11 DIAGNOSIS — J189 Pneumonia, unspecified organism: Secondary | ICD-10-CM | POA: Diagnosis not present

## 2015-10-11 DIAGNOSIS — N179 Acute kidney failure, unspecified: Secondary | ICD-10-CM | POA: Diagnosis not present

## 2015-10-11 DIAGNOSIS — K449 Diaphragmatic hernia without obstruction or gangrene: Secondary | ICD-10-CM | POA: Diagnosis not present

## 2015-10-11 DIAGNOSIS — J209 Acute bronchitis, unspecified: Secondary | ICD-10-CM | POA: Diagnosis not present

## 2015-10-11 DIAGNOSIS — R41841 Cognitive communication deficit: Secondary | ICD-10-CM | POA: Diagnosis not present

## 2015-10-11 DIAGNOSIS — J449 Chronic obstructive pulmonary disease, unspecified: Secondary | ICD-10-CM | POA: Diagnosis not present

## 2015-10-11 DIAGNOSIS — Z7984 Long term (current) use of oral hypoglycemic drugs: Secondary | ICD-10-CM | POA: Diagnosis not present

## 2015-10-11 DIAGNOSIS — E1122 Type 2 diabetes mellitus with diabetic chronic kidney disease: Secondary | ICD-10-CM | POA: Diagnosis not present

## 2015-10-11 DIAGNOSIS — I509 Heart failure, unspecified: Secondary | ICD-10-CM | POA: Diagnosis not present

## 2015-10-11 DIAGNOSIS — M6281 Muscle weakness (generalized): Secondary | ICD-10-CM | POA: Diagnosis not present

## 2015-10-11 DIAGNOSIS — N393 Stress incontinence (female) (male): Secondary | ICD-10-CM | POA: Diagnosis not present

## 2015-10-11 DIAGNOSIS — R0602 Shortness of breath: Secondary | ICD-10-CM | POA: Diagnosis not present

## 2015-10-11 DIAGNOSIS — I5032 Chronic diastolic (congestive) heart failure: Secondary | ICD-10-CM | POA: Diagnosis not present

## 2015-10-11 DIAGNOSIS — R2689 Other abnormalities of gait and mobility: Secondary | ICD-10-CM | POA: Diagnosis not present

## 2015-10-11 DIAGNOSIS — J440 Chronic obstructive pulmonary disease with acute lower respiratory infection: Secondary | ICD-10-CM | POA: Diagnosis not present

## 2015-10-11 DIAGNOSIS — I5033 Acute on chronic diastolic (congestive) heart failure: Secondary | ICD-10-CM | POA: Diagnosis not present

## 2015-10-11 DIAGNOSIS — R918 Other nonspecific abnormal finding of lung field: Secondary | ICD-10-CM | POA: Diagnosis not present

## 2015-10-11 DIAGNOSIS — I25810 Atherosclerosis of coronary artery bypass graft(s) without angina pectoris: Secondary | ICD-10-CM | POA: Diagnosis not present

## 2015-10-11 DIAGNOSIS — N183 Chronic kidney disease, stage 3 (moderate): Secondary | ICD-10-CM | POA: Diagnosis not present

## 2015-10-11 DIAGNOSIS — J441 Chronic obstructive pulmonary disease with (acute) exacerbation: Secondary | ICD-10-CM | POA: Diagnosis not present

## 2015-10-11 DIAGNOSIS — J9621 Acute and chronic respiratory failure with hypoxia: Secondary | ICD-10-CM | POA: Diagnosis not present

## 2015-10-11 DIAGNOSIS — Z9981 Dependence on supplemental oxygen: Secondary | ICD-10-CM | POA: Diagnosis not present

## 2015-10-11 DIAGNOSIS — E559 Vitamin D deficiency, unspecified: Secondary | ICD-10-CM | POA: Diagnosis not present

## 2015-10-11 DIAGNOSIS — Z7901 Long term (current) use of anticoagulants: Secondary | ICD-10-CM | POA: Diagnosis not present

## 2015-10-11 DIAGNOSIS — J9611 Chronic respiratory failure with hypoxia: Secondary | ICD-10-CM | POA: Diagnosis not present

## 2015-10-11 DIAGNOSIS — I130 Hypertensive heart and chronic kidney disease with heart failure and stage 1 through stage 4 chronic kidney disease, or unspecified chronic kidney disease: Secondary | ICD-10-CM | POA: Diagnosis not present

## 2015-10-11 DIAGNOSIS — Z9989 Dependence on other enabling machines and devices: Secondary | ICD-10-CM | POA: Diagnosis not present

## 2015-10-11 DIAGNOSIS — I4891 Unspecified atrial fibrillation: Secondary | ICD-10-CM | POA: Diagnosis not present

## 2015-10-11 DIAGNOSIS — D509 Iron deficiency anemia, unspecified: Secondary | ICD-10-CM | POA: Diagnosis not present

## 2015-10-11 DIAGNOSIS — E119 Type 2 diabetes mellitus without complications: Secondary | ICD-10-CM | POA: Diagnosis not present

## 2015-10-11 LAB — CREATININE, SERUM
CREATININE: 1.56 mg/dL — AB (ref 0.44–1.00)
GFR calc Af Amer: 34 mL/min — ABNORMAL LOW (ref >60)
GFR, EST NON AFRICAN AMERICAN: 29 mL/min — AB (ref >60)

## 2015-10-11 LAB — GLUCOSE, CAPILLARY
Glucose-Capillary: 115 mg/dL — ABNORMAL HIGH (ref 65–99)
Glucose-Capillary: 290 mg/dL — ABNORMAL HIGH (ref 65–99)

## 2015-10-11 MED ORDER — GUAIFENESIN-DM 100-10 MG/5ML PO SYRP: 5.0000 mL | ORAL_SOLUTION | ORAL | 0 refills | Status: DC | PRN

## 2015-10-11 MED ORDER — CEFUROXIME AXETIL 500 MG PO TABS
500.0000 mg | ORAL_TABLET | Freq: Two times a day (BID) | ORAL | 0 refills | Status: AC
Start: 2015-10-11 — End: 2015-10-13

## 2015-10-11 MED ORDER — HYDROCOD POLST-CPM POLST ER 10-8 MG/5ML PO SUER: 5.0000 mL | Freq: Two times a day (BID) | ORAL | 0 refills | Status: DC

## 2015-10-11 MED ORDER — IPRATROPIUM-ALBUTEROL 0.5-2.5 (3) MG/3ML IN SOLN: 3.0000 mL | RESPIRATORY_TRACT | Status: DC | PRN

## 2015-10-11 MED ORDER — PREDNISONE 10 MG PO TABS: ORAL_TABLET | ORAL | Status: DC

## 2015-10-11 MED ORDER — DEXTROSE 5 % IV SOLN: 250.0000 mg | INTRAVENOUS | Status: DC

## 2015-10-11 MED ORDER — AZITHROMYCIN 250 MG PO TABS
250.0000 mg | ORAL_TABLET | Freq: Every day | ORAL | 0 refills | Status: AC
Start: 2015-10-11 — End: 2015-10-13

## 2015-10-11 MED ORDER — SENNA 8.6 MG PO TABS: 1.0000 | ORAL_TABLET | Freq: Every day | ORAL | 0 refills | Status: DC | PRN

## 2015-10-11 NOTE — Progress Notes (Signed)
Inpatient Diabetes Program Recommendations  AACE/ADA: New Consensus Statement on Inpatient Glycemic Control (2015)  Target Ranges:  Prepandial:   less than 140 mg/dL      Peak postprandial:   less than 180 mg/dL (1-2 hours)      Critically ill patients:  140 - 180 mg/dL   Lab Results  Component Value Date   GLUCAP 290 (H) 10/11/2015   HGBA1C 5.6 10/01/2015    Review of Glycemic Control  Results for RIELEY, KHALSA (MRN [id number]) as of 10/11/2015 11:51  Ref. Range 10/10/2015 11:07 10/10/2015 16:06 10/10/2015 21:04 10/11/2015 07:31 10/11/2015 11:18  Glucose-Capillary Latest Ref Range: 65 - 99 mg/dL 278 (H) 233 (H) 315 (H) 115 (H) 290 (H)    Diabetes history:DM2 Outpatient Diabetes medications: Actos 15 mg daily, Metformin 1000 mg bid, Amaryl 4 mg daily Current orders for Inpatient glycemic control: Amaryl 4 mg QAM, Novolog 0-20 units TID with meals, Novolog 0-5 units QHS, Novolog 5 units tid with meals.  Inpatient Diabetes Program Recommendations: Insulin -  As steroids are tapered Lantus will need to be adjusted accordingly.  Ideal fasting blood sugar this morning - patient continues on steroids but is being discharged today without steroids ordered.    Elevated lunchtime CBG today because Novolog was held this am.  Continue Novolog as ordered to maintain ideal blood sugar control.   Gentry Fitz, RN, BA, MHA, CDE Diabetes Coordinator Inpatient Diabetes Program  [phone] (Team Pager) [phone] ([place]) 10/11/2015 12:09 PM

## 2015-10-11 NOTE — Progress Notes (Signed)
Pt to be discharged to [facility] today. Iv and tele removed. Report called to blair at the facility. Transport by ems. Daughter will take her bouquets home

## 2015-10-11 NOTE — Clinical Social Work Note (Signed)
Patient to be d/c'ed today to [facility].  Patient and family agreeable to plans will transport via ems RN to call report to [phone] room [phone].  Evette Cristal, MSW Mon-Fri 8a-4:30p [phone]

## 2015-10-11 NOTE — Discharge Summary (Addendum)
[place] at [place] NAME: Amy [name]    MR#:  [id number]  DATE OF BIRTH:  [birth year]  DATE OF ADMISSION:  10/01/2015 ADMITTING PHYSICIAN: Bettey Costa, MD  DATE OF DISCHARGE: 10/11/2015  PRIMARY CARE PHYSICIAN: Leonel Ramsay, MD    ADMISSION DIAGNOSIS:  Edema [R60.9] Acute on chronic diastolic congestive heart failure ([place]) [I50.33]  DISCHARGE DIAGNOSIS:  Principal Problem:   Acute on chronic diastolic (congestive) heart failure (HCC) Active Problems:   Essential hypertension   Acute renal failure superimposed on stage 3 chronic kidney disease (HCC)   Atherosclerosis of coronary artery bypass graft of native heart without angina pectoris   New onset atrial fibrillation (HCC)   Anemia   COPD exacerbation (HCC)   Bronchitis with obstruction (HCC)   Acute on chronic diastolic congestive heart failure (HCC)   Multiple lung nodules   Acute bronchitis   SECONDARY DIAGNOSIS:   Past Medical History:  Diagnosis Date  . Bladder incontinence   . CAD (coronary artery disease)    a. cardiac cath 05/2009: proximal LAD 90% stenosis s/p PCI/Xience 2.75 x 12 mm DES, mid LAD 40%, diagonal 40%, proximal LCx 40% followed by 40% LCx lesion, 30%-40%-30% lesion noted in the RCA with distal RCA 30% and 25% lesions  . Chronic diastolic (congestive) heart failure ([place])    a. Lexiscan 2013: no ischemia, normal EF b. echo 09/2015: EF 55-60% w/ Grade 1 DD  . CKD (chronic kidney disease) stage 3, GFR 30-59 ml/min   . Degenerative arthritis of knee    bilateral knees  . Diabetes mellitus    Type II  . Hiatal hernia   . Hypertension   . Iron deficiency   . Menopausal symptoms   . Morbid obesity ([place])   . Thyroid disease    hypothyroidism    HOSPITAL COURSE:   82 year old female with past medical history of diabetes, chronic kidney disease stage III, chronic diastolic CHF, COPD, hypothyroidism, essential hypertension who presented to the  hospital due to shortness of breath.  1. Acute on chronic respiratory failure with hypoxia-secondary to COPD exacerbation.  Patient was admitted to the hospital and treated aggressively with high-dose IV steroids, scheduled DuoNeb's, Pulmicort nebs, antitussives, flutter device.  -patient was treated and was slow to improve initially, but has clinically improved after aggressive therapy. -A pulmonary consult was obtained after a CT chest showed some mus plugging and post-obstructive atelectasis.  - as per Pulm. They plan on doing bronch but as outpatient once is over the her exacerbation.  - she is presently being discharged to skilled nursing facility on Prednisone taper, Oral Abx w/ Ceftin/Zithromax.  She was treated w/ IV Ceftriaxone, Zithromax while in the hospital.  - she will cont. Aggressive pulm. toileting with flutter device, anti-tussives.   2. COPD exacerbation- she was treated aggressively with IV steroidsscheduled DuoNeb's, Pulmicort nebs, Tussionex, Flutter device. - CT chest done while in the hospital showing ?? Mucus plugging but no need for bronch presently as per Pulmonary.   - no being discharged on Oral Prednisone, anti-tussives, empiric oral abx.  Cultures have been (-).  - she is also being discharged on Home O2 at 2L continuously.   3. Pneumoina - cultures have been (-).  Treated initially with Oral Levaquin but not improving and then switched to Ceftriaxone, Zithromax and now being discharged on Oral Ceftin, Zithromax.   4. Chronic diastolic CHF-clinically patient was not in CHF while in the hospital.  - she  will continue home dose Lasix, Clonidine patch, Losartan/HCTZ.   4. Essential HTN - BP stable presently. She will cont. Clonidine patch, Losartan/HCTZ, Norvasc.   5. DM - BS were bit high due to IV steroids but they are now being tapered.  - she will resume her Amaryl, Metformin upon discharge.     6. Hypothyroidism - she will cont. Synthroid.   7.  Urinary Incontinence - she will cont. Oxybutynin.   8. Hyperlipidemia - she will cont. Simvastatin.   9. Abnormal CT Chest - pt. CT chest showing some Pulm. Nodules, Mediastinal lymphadenopathy.  - she was seen by Pulmonary and they recommend Bronchoscopy but to be done later after she is over her infection/COPD Exacerbation. Follow up with them as outpatient.   DISCHARGE CONDITIONS:   Stable.   CONSULTS OBTAINED:  Treatment Team:  Wellington Hampshire, MD  DRUG ALLERGIES:   Allergies  Allergen Reactions  . Atenolol     Other reaction(s): Other (See Comments) Decreased heart rate  . Codeine     Rash, difficulty breathing, nausea.    DISCHARGE MEDICATIONS:     Medication List    TAKE these medications   amLODipine 5 MG tablet Commonly known as:  NORVASC TAKE ONE (1) TABLET BY MOUTH EVERY DAY FOR BLOOD PRESSURE   aspirin 81 MG tablet Take 81 mg by mouth daily.   azithromycin 250 MG tablet Commonly known as:  ZITHROMAX Take 1 tablet (250 mg total) by mouth daily.   cefUROXime 500 MG tablet Commonly known as:  CEFTIN Take 1 tablet (500 mg total) by mouth 2 (two) times daily with a meal.   chlorpheniramine-HYDROcodone 10-8 MG/5ML Suer Commonly known as:  TUSSIONEX Take 5 mLs by mouth every 12 (twelve) hours.   cloNIDine 0.3 mg/24hr patch Commonly known as:  CATAPRES - Dosed in mg/24 hr Place 0.3 mg onto the skin once a week.   clopidogrel 75 MG tablet Commonly known as:  PLAVIX Take 1 tablet (75 mg total) by mouth daily.   cyanocobalamin 1000 MCG/ML injection Commonly known as:  (VITAMIN B-12)   doxazosin 8 MG tablet Commonly known as:  CARDURA Take 1 tablet (8 mg total) by mouth daily.   fluticasone 50 MCG/ACT nasal spray Commonly known as:  FLONASE Place into the nose.   furosemide 20 MG tablet Commonly known as:  LASIX Take 1 tablet (20 mg total) by mouth 2 (two) times daily as needed.   glimepiride 4 MG tablet Commonly known as:  AMARYL Take 4  mg by mouth daily before breakfast.   guaiFENesin-dextromethorphan 100-10 MG/5ML syrup Commonly known as:  ROBITUSSIN DM Take 5 mLs by mouth every 4 (four) hours as needed for cough.   ipratropium-albuterol 0.5-2.5 (3) MG/3ML Soln Commonly known as:  DUONEB Take 3 mLs by nebulization every 4 (four) hours as needed (shortness of breath/wheezing.).   levothyroxine 125 MCG tablet Commonly known as:  SYNTHROID, LEVOTHROID Take 125 mcg by mouth daily.   losartan-hydrochlorothiazide 100-25 MG tablet Commonly known as:  HYZAAR Take 1 tablet by mouth daily.   metFORMIN 1000 MG tablet Commonly known as:  GLUCOPHAGE Take 1,000 mg by mouth 2 (two) times daily with a meal.   oxybutynin 5 MG tablet Commonly known as:  DITROPAN Take 5 mg by mouth 1 dose over 46 hours.   pantoprazole 40 MG tablet Commonly known as:  PROTONIX Take 40 mg by mouth daily.   pioglitazone 15 MG tablet Commonly known as:  ACTOS Take 15 mg by  mouth daily.   predniSONE 10 MG tablet Commonly known as:  DELTASONE Label  & dispense according to the schedule below. 5 Pills PO for 2 days then, 4 Pills PO for 2 days, 3 Pills PO for 2 days, 2 Pills PO for 2 days, 1 Pill PO for 2 days then STOP.   PRESERVISION AREDS PO Take by mouth 2 (two) times daily.   senna 8.6 MG Tabs tablet Commonly known as:  SENOKOT Take 1 tablet (8.6 mg total) by mouth daily as needed for mild constipation.   simvastatin 40 MG tablet Commonly known as:  ZOCOR Take 1 tablet (40 mg total) by mouth at bedtime. What changed:  when to take this   SYMBICORT 160-4.5 MCG/ACT inhaler Generic drug:  budesonide-formoterol Inhale 2 puffs into the lungs 2 (two) times daily.         DISCHARGE INSTRUCTIONS:   DIET:  Cardiac diet and Diabetic diet  DISCHARGE CONDITION:  Stable  ACTIVITY:  Activity as tolerated  OXYGEN:  Home Oxygen: Yes.     Oxygen Delivery: 2 liters/min via Patient connected to nasal cannula oxygen  DISCHARGE  LOCATION:  nursing home   If you experience worsening of your admission symptoms, develop shortness of breath, life threatening emergency, suicidal or homicidal thoughts you must seek medical attention immediately by calling 911 or calling your MD immediately  if symptoms less severe.  You Must read complete instructions/literature along with all the possible adverse reactions/side effects for all the Medicines you take and that have been prescribed to you. Take any new Medicines after you have completely understood and accpet all the possible adverse reactions/side effects.   Please note  You were cared for by a hospitalist during your hospital stay. If you have any questions about your discharge medications or the care you received while you were in the hospital after you are discharged, you can call the unit and asked to speak with the hospitalist on call if the hospitalist that took care of you is not available. Once you are discharged, your primary care physician will handle any further medical issues. Please note that NO REFILLS for any discharge medications will be authorized once you are discharged, as it is imperative that you return to your primary care physician (or establish a relationship with a primary care physician if you do not have one) for your aftercare needs so that they can reassess your need for medications and monitor your lab values.     Today   Shortness of breath improved.  Still has a cough but non-productive.  Overall better since admission.   VITAL SIGNS:  Blood pressure (!) 143/44, pulse 83, temperature 97.5 F (36.4 C), temperature source Oral, resp. rate (!) 22, height '5\' 2"'$  (1.575 m), weight 112.8 kg (248 lb 9.6 oz), SpO2 93 %.  I/O:    Intake/Output Summary (Last 24 hours) at 10/11/15 1309 Last data filed at 10/11/15 0900  Gross per 24 hour  Intake              720 ml  Output              400 ml  Net              320 ml    PHYSICAL EXAMINATION:    GENERAL:  82 y.o.-year-old patient sitting up in chair in NAD.   EYES: Pupils equal, round, reactive to light and accommodation. No scleral icterus. Extraocular muscles intact.  HEENT: Head atraumatic, normocephalic.  Oropharynx and nasopharynx clear.  NECK:  Supple, no jugular venous distention. No thyroid enlargement, no tenderness.  LUNGS: minimal end-exp. Wheezing rhonchi b/l, good air entry bilaterally, + coughing paroxysms. No use of accessory muscles CARDIOVASCULAR: S1, S2 normal. No murmurs, rubs, or gallops.  ABDOMEN: Soft, nontender, nondistended. Bowel sounds present. No organomegaly or mass.  EXTREMITIES: No cyanosis, clubbing, + 1 edema b/l    NEUROLOGIC: Cranial nerves II through XII are intact. No focal Motor or sensory deficits b/l. Globally weak.  PSYCHIATRIC: The patient is alert and oriented x 3.  SKIN: No obvious rash, lesion, or ulcer.     DATA REVIEW:   CBC  Recent Labs Lab 10/08/15 0537 10/10/15 1411  WBC 16.3*  --   HGB 10.1* 9.8*  HCT 30.3*  --   PLT 259  --     Chemistries   Recent Labs Lab 10/07/15 0512  10/11/15 0331  NA 139  --   --   K 4.8  --   --   CL 103  --   --   CO2 27  --   --   GLUCOSE 266*  --   --   BUN 61*  --   --   CREATININE 1.53*  < > 1.56*  CALCIUM 8.4*  --   --   < > = values in this interval not displayed.  Cardiac Enzymes No results for input(s): TROPONINI in the last 168 hours.   RADIOLOGY:  No results found.    Management plans discussed with the patient, family and they are in agreement.  CODE STATUS:     Code Status Orders        Start     Ordered   10/01/15 1145  Full code  Continuous     10/01/15 1144    Code Status History    Date Active Date Inactive Code Status Order ID Comments User Context   10/01/2015 11:44 AM 10/02/2015  7:48 AM Full Code [id number]  Bettey Costa, MD Inpatient    Advance Directive Documentation   Flowsheet Row Most Recent Value  Type of Advance Directive  Healthcare Power  of Attorney  Pre-existing out of facility DNR order (yellow form or pink MOST form)  No data  "MOST" Form in Place?  No data      TOTAL TIME TAKING CARE OF THIS PATIENT: 40 minutes.    Henreitta Leber M.D on 10/11/2015 at 1:09 PM  Between 7am to 6pm - Pager - [phone]  After 6pm go to [email or web address] - [employer] [facility]  Office  [phone]  CC: Primary care physician; Leonel Ramsay, MD

## 2015-10-12 DIAGNOSIS — R918 Other nonspecific abnormal finding of lung field: Secondary | ICD-10-CM | POA: Diagnosis not present

## 2015-10-12 DIAGNOSIS — J9611 Chronic respiratory failure with hypoxia: Secondary | ICD-10-CM | POA: Diagnosis not present

## 2015-10-12 DIAGNOSIS — J449 Chronic obstructive pulmonary disease, unspecified: Secondary | ICD-10-CM | POA: Diagnosis not present

## 2015-10-12 DIAGNOSIS — I509 Heart failure, unspecified: Secondary | ICD-10-CM | POA: Diagnosis not present

## 2015-10-12 DIAGNOSIS — J189 Pneumonia, unspecified organism: Secondary | ICD-10-CM | POA: Diagnosis not present

## 2015-10-12 DIAGNOSIS — E119 Type 2 diabetes mellitus without complications: Secondary | ICD-10-CM | POA: Diagnosis not present

## 2015-10-16 LAB — GLUCOSE, CAPILLARY
GLUCOSE-CAPILLARY: 152 mg/dL — AB (ref 65–99)
Glucose-Capillary: 51 mg/dL — ABNORMAL LOW (ref 65–99)

## 2015-10-19 ENCOUNTER — Encounter: Payer: Self-pay | Admitting: Family

## 2015-10-19 ENCOUNTER — Non-Acute Institutional Stay (SKILLED_NURSING_FACILITY): Payer: Medicare Other | Admitting: Gerontology

## 2015-10-19 ENCOUNTER — Ambulatory Visit: Payer: Medicare Other | Admitting: Family

## 2015-10-19 DIAGNOSIS — J441 Chronic obstructive pulmonary disease with (acute) exacerbation: Secondary | ICD-10-CM | POA: Diagnosis not present

## 2015-10-22 LAB — CBC WITH DIFFERENTIAL/PLATELET
Basophils Absolute: 0 10*3/uL (ref 0–0.1)
Basophils Relative: 0 %
EOS PCT: 1 %
Eosinophils Absolute: 0.1 10*3/uL (ref 0–0.7)
HCT: 29 % — ABNORMAL LOW (ref 35.0–47.0)
Hemoglobin: 9.8 g/dL — ABNORMAL LOW (ref 12.0–16.0)
LYMPHS ABS: 1.1 10*3/uL (ref 1.0–3.6)
LYMPHS PCT: 16 %
MCH: 31.4 pg (ref 26.0–34.0)
MCHC: 33.7 g/dL (ref 32.0–36.0)
MCV: 93.3 fL (ref 80.0–100.0)
MONO ABS: 0.5 10*3/uL (ref 0.2–0.9)
MONOS PCT: 7 %
Neutro Abs: 5.5 10*3/uL (ref 1.4–6.5)
Neutrophils Relative %: 76 %
PLATELETS: 172 10*3/uL (ref 150–440)
RBC: 3.11 MIL/uL — ABNORMAL LOW (ref 3.80–5.20)
RDW: 17.4 % — AB (ref 11.5–14.5)
WBC: 7.2 10*3/uL (ref 3.6–11.0)

## 2015-10-22 LAB — COMPREHENSIVE METABOLIC PANEL
ALT: 17 U/L (ref 14–54)
AST: 15 U/L (ref 15–41)
Albumin: 3.5 g/dL (ref 3.5–5.0)
Alkaline Phosphatase: 60 U/L (ref 38–126)
Anion gap: 10 (ref 5–15)
BUN: 43 mg/dL — ABNORMAL HIGH (ref 6–20)
CHLORIDE: 102 mmol/L (ref 101–111)
CO2: 26 mmol/L (ref 22–32)
CREATININE: 1.48 mg/dL — AB (ref 0.44–1.00)
Calcium: 8.9 mg/dL (ref 8.9–10.3)
GFR, EST AFRICAN AMERICAN: 36 mL/min — AB (ref >60)
GFR, EST NON AFRICAN AMERICAN: 31 mL/min — AB (ref >60)
Glucose, Bld: 177 mg/dL — ABNORMAL HIGH (ref 65–99)
POTASSIUM: 4.1 mmol/L (ref 3.5–5.1)
Sodium: 138 mmol/L (ref 135–145)
Total Bilirubin: 0.5 mg/dL (ref 0.3–1.2)
Total Protein: 5.4 g/dL — ABNORMAL LOW (ref 6.5–8.1)

## 2015-10-22 LAB — MAGNESIUM: MAGNESIUM: 2.1 mg/dL (ref 1.7–2.4)

## 2015-10-22 LAB — VITAMIN B12: VITAMIN B 12: 339 pg/mL (ref 180–914)

## 2015-10-22 NOTE — Progress Notes (Signed)
Location:      Place of Service:  SNF (31) Provider:  Toni Arthurs, NP-C  Ola Spurr, DAVID Mamie Nick, MD  Patient Care Team: Leonel Ramsay, MD as PCP - General (Infectious Diseases) Minna Merritts, MD as Consulting Physician (Cardiology)  Extended Emergency Contact Information Primary Emergency Contact: Hazel Sams Address: [place] [address] [place] Phone: [phone] Relation: Daughter  Code Status:  Full Goals of care: Advanced Directive information Advanced Directives 10/01/2015  Does patient have an advance directive? Yes  Type of Advance Directive [place]  Does patient want to make changes to advanced directive? -  Copy of advanced directive(s) in chart? No - copy requested  Would patient like information on creating an advanced directive? -     Chief Complaint  Patient presents with  . Acute Visit    HPI:  Pt is a 82 y.o. female seen today for an acute visit for productive cough. Pt has been recently hospitalized with PNA. She is fearful of infection/ PNA returning. Pt has had a productive cough for a few days. She reports she was unable to "break it up" in the hospital, but is able to now. She describes sputum as thick, tan. Denies chest pain, dyspnea- beyond the normal for her. Denies fever, chills. VSS. New diagnosis of COPD.    Past Medical History:  Diagnosis Date  . Bladder incontinence   . CAD (coronary artery disease)    a. cardiac cath 05/2009: proximal LAD 90% stenosis s/p PCI/Xience 2.75 x 12 mm DES, mid LAD 40%, diagonal 40%, proximal LCx 40% followed by 40% LCx lesion, 30%-40%-30% lesion noted in the RCA with distal RCA 30% and 25% lesions  . Chronic diastolic (congestive) heart failure ([place])    a. Lexiscan 2013: no ischemia, normal EF b. echo 09/2015: EF 55-60% w/ Grade 1 DD  . CKD (chronic kidney disease) stage 3, GFR 30-59 ml/min   . Degenerative arthritis of knee    bilateral knees  . Diabetes mellitus    Type II  . Hiatal hernia   . Hypertension   . Iron deficiency   . Menopausal symptoms   . Morbid obesity ([place])   . Thyroid disease    hypothyroidism   Past Surgical History:  Procedure Laterality Date  . COLONOSCOPY  2015  . REPLACEMENT TOTAL KNEE BILATERAL    . TOTAL VAGINAL HYSTERECTOMY     ovarian mass, not cancerous  . UPPER GI ENDOSCOPY  2015    Allergies  Allergen Reactions  . Atenolol     Other reaction(s): Other (See Comments) Decreased heart rate  . Codeine     Rash, difficulty breathing, nausea.      Medication List       Accurate as of 10/19/15 11:59 PM. Always use your most recent med list.          amLODipine 5 MG tablet Commonly known as:  NORVASC TAKE ONE (1) TABLET BY MOUTH EVERY DAY FOR BLOOD PRESSURE   aspirin 81 MG tablet Take 81 mg by mouth daily.   chlorpheniramine-HYDROcodone 10-8 MG/5ML Suer Commonly known as:  TUSSIONEX Take 5 mLs by mouth every 12 (twelve) hours.   cloNIDine 0.3 mg/24hr patch Commonly known as:  CATAPRES - Dosed in mg/24 hr Place 0.3 mg onto the skin once a week.   clopidogrel 75 MG tablet Commonly known as:  PLAVIX Take 1 tablet (75 mg  total) by mouth daily.   cyanocobalamin 1000 MCG/ML injection Commonly known as:  (VITAMIN B-12)   doxazosin 8 MG tablet Commonly known as:  CARDURA Take 1 tablet (8 mg total) by mouth daily.   fluticasone 50 MCG/ACT nasal spray Commonly known as:  FLONASE Place into the nose.   furosemide 20 MG tablet Commonly known as:  LASIX Take 1 tablet (20 mg total) by mouth 2 (two) times daily as needed.   glimepiride 4 MG tablet Commonly known as:  AMARYL Take 4 mg by mouth daily before breakfast.   guaiFENesin-dextromethorphan 100-10 MG/5ML syrup Commonly known as:  ROBITUSSIN DM Take 5 mLs by mouth every 4 (four) hours as needed for cough.   ipratropium-albuterol 0.5-2.5 (3) MG/3ML Soln Commonly known as:  DUONEB Take 3 mLs by  nebulization every 4 (four) hours as needed (shortness of breath/wheezing.).   levothyroxine 125 MCG tablet Commonly known as:  SYNTHROID, LEVOTHROID Take 125 mcg by mouth daily.   losartan-hydrochlorothiazide 100-25 MG tablet Commonly known as:  HYZAAR Take 1 tablet by mouth daily.   metFORMIN 1000 MG tablet Commonly known as:  GLUCOPHAGE Take 1,000 mg by mouth 2 (two) times daily with a meal.   oxybutynin 5 MG tablet Commonly known as:  DITROPAN Take 5 mg by mouth 1 dose over 46 hours.   pantoprazole 40 MG tablet Commonly known as:  PROTONIX Take 40 mg by mouth daily.   pioglitazone 15 MG tablet Commonly known as:  ACTOS Take 15 mg by mouth daily.   predniSONE 10 MG tablet Commonly known as:  DELTASONE Label  & dispense according to the schedule below. 5 Pills PO for 2 days then, 4 Pills PO for 2 days, 3 Pills PO for 2 days, 2 Pills PO for 2 days, 1 Pill PO for 2 days then STOP.   PRESERVISION AREDS PO Take by mouth 2 (two) times daily.   senna 8.6 MG Tabs tablet Commonly known as:  SENOKOT Take 1 tablet (8.6 mg total) by mouth daily as needed for mild constipation.   simvastatin 40 MG tablet Commonly known as:  ZOCOR Take 1 tablet (40 mg total) by mouth at bedtime.   SYMBICORT 160-4.5 MCG/ACT inhaler Generic drug:  budesonide-formoterol Inhale 2 puffs into the lungs 2 (two) times daily.       Review of Systems  Constitutional: Negative for activity change, appetite change, chills, diaphoresis and fever.  HENT: Positive for congestion. Negative for sneezing, sore throat, trouble swallowing and voice change.   Eyes: Negative for pain, redness and visual disturbance.  Respiratory: Positive for cough. Negative for apnea, choking, chest tightness, shortness of breath and wheezing.   Cardiovascular: Negative for chest pain, palpitations and leg swelling.  Gastrointestinal: Negative for abdominal distention, abdominal pain, constipation, diarrhea and nausea.    Genitourinary: Negative for difficulty urinating, dysuria, frequency and urgency.  Musculoskeletal: Negative for back pain, gait problem and myalgias. Arthralgias: typical arthritis.  Skin: Negative for color change, pallor, rash and wound.  Neurological: Negative for dizziness, tremors, syncope, speech difficulty, weakness, numbness and headaches.  Psychiatric/Behavioral: Negative for agitation and behavioral problems.  All other systems reviewed and are negative.   Immunization History  Administered Date(s) Administered  . Influenza,inj,Quad PF,36+ Mos 12/05/2014   Pertinent  Health Maintenance Due  Topic Date Due  . FOOT EXAM  03/18/1937  . OPHTHALMOLOGY EXAM  03[birth year]18/1937  . DEXA SCAN  03/17/1994  . PNA vac Low Risk Adult (1 of 2 - PCV13) 03/17/1994  .  INFLUENZA VACCINE  09/25/2015  . HEMOGLOBIN A1C  04/02/2016   No flowsheet data found. Functional Status Survey:    Vitals:   10/19/15 0500  BP: (!) 145/40  Pulse: 93  Resp: 20  Temp: 98 F (36.7 C)  SpO2: 96%  Weight: 246 lb 6.4 oz (111.8 kg)   Body mass index is 45.07 kg/m. Physical Exam  Constitutional: She is oriented to person, place, and time. Vital signs are normal. She appears well-developed and well-nourished. She is active and cooperative. She does not appear ill. No distress. Nasal cannula in place.  HENT:  Head: Normocephalic and atraumatic.  Mouth/Throat: Uvula is midline, oropharynx is clear and moist and mucous membranes are normal. Mucous membranes are not pale, not dry and not cyanotic.  Eyes: Conjunctivae, EOM and lids are normal. Pupils are equal, round, and reactive to light.  Neck: Trachea normal, normal range of motion and full passive range of motion without pain. Neck supple. No JVD present. No tracheal deviation, no edema and no erythema present. No thyromegaly present.  Cardiovascular: Normal rate, regular rhythm, normal heart sounds, intact distal pulses and normal pulses.  Exam reveals no  gallop, no distant heart sounds and no friction rub.   No murmur heard. Pulmonary/Chest: Effort normal. No accessory muscle usage. No respiratory distress. She has no decreased breath sounds. She has wheezes (faint) in the right lower field and the left lower field. She has no rhonchi. She has no rales. She exhibits no tenderness.  Abdominal: Soft. Normal appearance and bowel sounds are normal. She exhibits no distension and no ascites. There is no tenderness.  Musculoskeletal: Normal range of motion. She exhibits no edema or tenderness.  Expected osteoarthritis, stiffness  Neurological: She is alert and oriented to person, place, and time. She has normal strength.  Skin: Skin is warm, dry and intact. No rash noted. She is not diaphoretic. No cyanosis or erythema. No pallor. Nails show no clubbing.  Psychiatric: She has a normal mood and affect. Her speech is normal and behavior is normal. Judgment and thought content normal. Cognition and memory are normal.  Nursing note and vitals reviewed.   Labs reviewed:  Recent Labs  10/04/15 0337 10/05/15 0652 10/06/15 0506 10/07/15 0512 10/08/15 0448 10/11/15 0331  NA 137 139 141 139  --   --   K 4.3 3.8 4.1 4.8  --   --   CL 102 104 102 103  --   --   CO2 _0 --   --   GLUCOSE 195* 120* 75 266*  --   --   BUN 73* 66* 63* 61*  --   --   CREATININE 1.80* 1.50* 1.59* 1.53* 1.54* 1.56*  CALCIUM 8.1* 8.1* 8.4* 8.4*  --   --   MG 2.2  --   --   --   --   --     Recent Labs  10/01/15 0847  AST 18  ALT 14  ALKPHOS 81  BILITOT 0.3  PROT 6.4*  ALBUMIN 3.7    Recent Labs  07/03/15 1043 08/01/15 1025  10/04/15 0337  10/08/15 0537 10/10/15 1411 10/22/15 1030  WBC 8.5 8.5  < > 12.1*  --  16.3*  --  7.2  NEUTROABS 5.4 5.4  --   --   --   --   --  5.5  HGB 12.4 10.7*  < > 9.5*  < > 10.1* 9.8* 9.8*  HCT 37.5 32.6*  < >  28.0*  --  30.3*  --  29.0*  MCV 89.2 87.4  < > 93.1  --  93.6  --  93.3  PLT 248 324  < > 223  --  259  --   172  < > = values in this interval not displayed. Lab Results  Component Value Date   TSH 5.103 (H) 10/01/2015   Lab Results  Component Value Date   HGBA1C 5.6 10/01/2015   Lab Results  Component Value Date   CHOL 135 06/25/2010   HDL 35 (L) 06/25/2010   LDLCALC 56 06/25/2010   TRIG 220 (H) 06/25/2010   CHOLHDL 3.9 06/25/2010    Significant Diagnostic Results in last 30 days:  Dg Chest 1 View  Result Date: 10/08/2015 CLINICAL DATA:  82 year old female with shortness of breath chest pain and lower extremity swelling. Initial encounter. EXAM: CHEST 1 VIEW COMPARISON:  Chest CTA 10/01/2015 and earlier. FINDINGS: Portable AP upright view at 0618 hours. Moderate size gastric hiatal hernia is less apparent. Stable cardiac size and mediastinal contours. Calcified aortic atherosclerosis. Visualized tracheal air column is within normal limits. Regressed pulmonary vascularity since 10/01/2015. No pneumothorax. The right lung appears clear. However, there is new retrocardiac opacity at the left lung base partially obscuring the diaphragm. IMPRESSION: 1. New confluent left lung base opacity might reflect atelectasis, but is suspicious for aspiration or infection. 2.  Calcified aortic atherosclerosis. Electronically Signed   By: Genevie Ann M.D.   On: 10/08/2015 07:14   Ct Chest Wo Contrast  Result Date: 10/09/2015 CLINICAL DATA:  Shortness of breath, cough for a week EXAM: CT CHEST WITHOUT CONTRAST TECHNIQUE: Multidetector CT imaging of the chest was performed following the standard protocol without IV contrast. COMPARISON:  Chest x-ray of 10/08/2015 FINDINGS: Cardiovascular: The mid ascending thoracic aorta measures 35 mm in diameter. There is calcification and probable stent within the left anterior descending coronary artery. The heart is within upper limits of normal. No pericardial effusion is seen. Calcification of the mitral annulus is noted Mediastinum/Nodes: No mediastinal or hilar adenopathy is  seen. A pre trachea node is present of 10 mm in short axis diameter. A small to moderate size hiatal hernia is present. Lungs/Pleura: Biapical pleural-parenchymal scarring is noted. There is parenchymal opacity within the deep posterior medial left lower lobe most likely due to atelectasis or possibly a patchy area of pneumonia. In addition there is atelectasis and possible patchy pneumonia within the right middle lobe with air bronchograms present. The bronchus to the right middle lobe appears narrowed and partially occluded possibly due to a mucous plugging, but an endobronchial lesion cannot be excluded. Clinical followup and possible bronchoscopy may be helpful. Within the left lower lobe there is a 6 mm noncalcified nodule present on image 82. A 5 mm nodule is also noted within the left lower lobe on image 79. Series 3. A slight protrusion from the right superior mediastinum on image 25 appears to represent fat and is of doubtful significance. Upper Abdomen: There is calcification noted within the upper pole of the left kidney which could be dystrophic, but a calcified renal lesion cannot be excluded. Correlate clinically and consider CT the abdomen pelvis if warranted with IV contrast media. Musculoskeletal: The thoracic vertebrae are in normal alignment with diffuse degenerative spurring and degenerative disc disease. IMPRESSION: 1. Parenchymal opacity within the deep left lower lobe as well as within the right middle lobe some which may be due to atelectasis, but pneumonia cannot be excluded. 2.  Occlusion of the bronchus to the right middle lobe may represent mucous plugging although and endobronchial lesion cannot be excluded. 3. 6 mm and 5 mm noncalcified nodules within the left lower lobe. Non-contrast chest CT at 3-6 months is recommended. If the nodules are stable at time of repeat CT, then future CT at 18-24 months (from today's scan) is considered optional for low-risk patients, but is recommended for  high-risk patients. This recommendation follows the consensus statement: Guidelines for Management of Incidental Pulmonary Nodules Detected on CT Images:From the Fleischner Society 2017; published online before print (10.1148/[id number]). Calcification in the upper pole of the left kidney on the last few images. This could be dystrophic, but a calcified renal lesion cannot be excluded. Correlate clinically and consider CT of the abdomen pelvis with IV contrast. 4. Moderate thoracic aortic atherosclerosis. Electronically Signed   By: Ivar Drape M.D.   On: 10/09/2015 12:08   Ct Angio Chest Pe W Or Wo Contrast  Result Date: 10/01/2015 CLINICAL DATA:  Chest pain and short of breath EXAM: CT ANGIOGRAPHY CHEST WITH CONTRAST TECHNIQUE: Multidetector CT imaging of the chest was performed using the standard protocol during bolus administration of intravenous contrast. Multiplanar CT image reconstructions and MIPs were obtained to evaluate the vascular anatomy. CONTRAST:  60 cc Isovue 370 COMPARISON:  None. FINDINGS: There are no filling defects in the pulmonary arterial tree to suggest acute pulmonary thromboembolism There is no evidence of aortic dissection or aneurysm. Great vessels are grossly patent. There is no abnormal mediastinal adenopathy. No pericardial effusion. Three vessel coronary artery calcification. Mitral annular calcification. Mild peripheral aortic valvular calcification. Large hiatal hernia No pneumothorax or pleural effusion. 6 mm right upper pole nodule on image 17. 6 mm left lower lobe pulmonary nodule on image 52. 7 mm left lower lobe pulmonary nodule on image 52. No acute bony deformity. Review of the MIP images confirms the above findings. IMPRESSION: No evidence of acute pulmonary thromboembolism Hiatal hernia. Multiple bilateral small pulmonary nodules. The largest in the left lower lobe measures 7 mm. Non-contrast chest CT at 3-6 months is recommended. If the nodules are stable at  time of repeat CT, then future CT at 18-24 months (from today's scan) is considered optional for low-risk patients, but is recommended for high-risk patients. This recommendation follows the consensus statement: Guidelines for Management of Incidental Pulmonary Nodules Detected on CT Images:From the Fleischner Society 2017; published online before print (10.1148/radiol.[id number]). Electronically Signed   By: Marybelle Killings M.D.   On: 10/01/2015 16:09   US Venous Img Lower Bilateral  Result Date: 10/01/2015 CLINICAL DATA:  Bilateral lower extremity edema, left greater than right. Evaluate for DVT. EXAM: BILATERAL LOWER EXTREMITY VENOUS DOPPLER ULTRASOUND TECHNIQUE: Gray-scale sonography with graded compression, as well as color Doppler and duplex ultrasound were performed to evaluate the lower extremity deep venous systems from the level of the common femoral vein and including the common femoral, femoral, profunda femoral, popliteal and calf veins including the posterior tibial, peroneal and gastrocnemius veins when visible. The superficial great saphenous vein was also interrogated. Spectral Doppler was utilized to evaluate flow at rest and with distal augmentation maneuvers in the common femoral, femoral and popliteal veins. COMPARISON:  Left lower extremity venous Doppler ultrasound - 10/12/2013 FINDINGS: Examination is degraded due to patient body habitus and poor sonographic window. RIGHT LOWER EXTREMITY Common Femoral Vein: No evidence of thrombus. Normal compressibility, respiratory phasicity and response to augmentation. Saphenofemoral Junction: No evidence of thrombus. Normal compressibility and flow on  color Doppler imaging. Profunda Femoral Vein: No evidence of thrombus. Normal compressibility and flow on color Doppler imaging. Femoral Vein: No evidence of thrombus. Normal compressibility, respiratory phasicity and response to augmentation. Popliteal Vein: No evidence of thrombus. Normal  compressibility, respiratory phasicity and response to augmentation. Calf Veins: No evidence of thrombus. Normal compressibility and flow on color Doppler imaging. Superficial Great Saphenous Vein: No evidence of thrombus. Normal compressibility and flow on color Doppler imaging. Venous Reflux:  None. Other Findings:  None. LEFT LOWER EXTREMITY Common Femoral Vein: No evidence of thrombus. Normal compressibility, respiratory phasicity and response to augmentation. Saphenofemoral Junction: No evidence of thrombus. Normal compressibility and flow on color Doppler imaging. Profunda Femoral Vein: No evidence of thrombus. Normal compressibility and flow on color Doppler imaging. Femoral Vein: No evidence of thrombus. Normal compressibility, respiratory phasicity and response to augmentation. Popliteal Vein: No evidence of thrombus. Normal compressibility, respiratory phasicity and response to augmentation. Calf Veins: No evidence of thrombus. Normal compressibility and flow on color Doppler imaging. Superficial Great Saphenous Vein: No evidence of thrombus. Normal compressibility and flow on color Doppler imaging. Venous Reflux:  None. Other Findings:  None. IMPRESSION: No evidence of DVT within either lower extremity. Electronically Signed   By: Sandi Mariscal M.D.   On: 10/01/2015 15:45   Dg Chest Portable 1 View  Result Date: 10/01/2015 CLINICAL DATA:  82 year old female with lower extremity swelling for several days, shortness of breath and chest pain. Initial encounter. EXAM: PORTABLE CHEST 1 VIEW COMPARISON:  12/28/2014 and earlier. FINDINGS: Portable AP upright view at 0850 hours. Chronic hiatal hernia. Stable cardiac size and mediastinal contours. Calcified aortic atherosclerosis. Mild acute increased interstitial opacity diffusely. No pneumothorax, pleural effusion or consolidation. IMPRESSION: 1. Mild pulmonary interstitial edema suspected. No other acute cardiopulmonary abnormality. 2.  Calcified aortic  atherosclerosis. Electronically Signed   By: Genevie Ann M.D.   On: 10/01/2015 09:05    Assessment/Plan 1. COPD exacerbation (HCC)  Solumedrol 125 mg IM x 1  D/C current Prednisone order  Prednisone- slow taper 60 mg x 5 days, 40 mg x 5, 20 mg x 5, 10 mg x 4  duonebs scheduled TID x 3 days, then prn  Guaifenesin 10 mL po QID with 8 ounces of water x 3 days, then Q 4 hours prn  Oxygen 2 L/[place] continuous   Family/ staff Communication:   Total Time: 35 minutes  Documentation: 15 minutes  Face to Face: 20 minutes  Family/Phone:   Labs/tests ordered:  Cbc, met c, b12, d, mag+  Medication list reviewed and assessed for continued appropriateness.  Vikki Ports, NP-C Geriatrics [facility] [phone], [place] [address] Cell Phone (Mon-Fri 8am-5pm):  [phone] On Call:  [phone] & follow prompts after 5pm & weekends Office Phone:  [phone] Office Fax:  [phone]

## 2015-10-23 LAB — IRON AND TIBC
Iron: 38 ug/dL (ref 28–170)
SATURATION RATIOS: 12 % (ref 10.4–31.8)
TIBC: 306 ug/dL (ref 250–450)
UIBC: 268 ug/dL

## 2015-10-23 LAB — VITAMIN D 25 HYDROXY (VIT D DEFICIENCY, FRACTURES): Vit D, 25-Hydroxy: 6.3 ng/mL — ABNORMAL LOW (ref 30.0–100.0)

## 2015-10-25 DIAGNOSIS — R0602 Shortness of breath: Secondary | ICD-10-CM | POA: Diagnosis not present

## 2015-10-25 DIAGNOSIS — R918 Other nonspecific abnormal finding of lung field: Secondary | ICD-10-CM | POA: Diagnosis not present

## 2015-10-25 NOTE — Progress Notes (Deleted)
* [facility]     Assessment and Plan:  Atelectasis.  -Review CT of the chest shows atelectasis of the medial segment of the right middle lobe. This is most likely due to external compression, however an endobronchial tumor cannot be ruled out based on the scan. -Patient's oxygen saturation is currently adequate at room air, I don't think there is an acute need for bronchoscopy at this time. -She was encouraged to use her incentive spirometry and flutter valve. More often. In addition, advancing her activity could help resolve her atelectasis. -Given the presence of lung nodules, atelectasis, paratracheal lymphadenopathy, the patient may require bronchoscopy in the future. She was given my card today. She'll be asked to follow up with [place] in the office in 6-8 weeks' time with repeat CAT scan. However, as the patient is on Plavix, she would need to be off of this for one week before considering any interventional procedure.  Acute bronchitis.  -Continue antibiotics.  Hiatal hernia/intrathoracic stomach. -Patient has a significant hiatal hernia, which is likely contributing to dyspnea and atelectasis. -The patient is likely at high risk of aspiration, continue to treat with antireflux measures.  CHF with cardiomegaly. -Patient has significant cardiomegaly noted on CT of the chest, this likely attributing to atelectasis.  Obesity. -BMI = 46 -Likely contributing to dyspnea, weight loss would be beneficial for her dyspnea. -Obesity is likely contributing to atelectasis due to external compression from chest wall and diaphragm.  Dyspnea. -The patient's dyspnea is likely multifactorial from cardiomegaly with chronic diastolic congestive heart failure, bronchitis, atelectasis, morbid obesity, deconditioning. -The patient would benefit from continued physical therapy, and advancing activity.   Date: 10/25/2015  MRN# [id number] Gracyn Allor Trebilcock 1928-01-28   Nardos C  Cleek is a 82 y.o. old female seen in follow up for chief complaint of  No chief complaint on file.    HPI:   Patient is a 82 year old female, she is a nonsmoker but lived with a smoker in the past. She was admitted with symptoms of acute bronchitis and congestive heart failure on 8/7. She was seen in the hospital and was noted to have bronchitis, obesity, RML atelectasis after being int he hospital for a week. It was thought that endobronchial tumour could not be ruled out, therefore I asked that she repeat a CT chest in 6 weeks. She is following now as a hospital follow up.   Reviewed images of CT chest 10/09/15 in comparison with previous images on 10/01/15, there is new area of atelectasis in the medial segment of the right middle lobe, likely from external compression, I do not see any evidence of a endobronchial lesion. There is also subcarinal and paratracheal lymphadenopathy. There is a large hiatal hernia with intrathoracic stomach, there is also significant cardiomegaly.  Medication:   Outpatient Encounter Prescriptions as of 10/26/2015  Medication Sig  . amLODipine (NORVASC) 5 MG tablet TAKE ONE (1) TABLET BY MOUTH EVERY DAY FOR BLOOD PRESSURE  . aspirin 81 MG tablet Take 81 mg by mouth daily.  . chlorpheniramine-HYDROcodone (TUSSIONEX) 10-8 MG/5ML SUER Take 5 mLs by mouth every 12 (twelve) hours.  . cloNIDine (CATAPRES - DOSED IN MG/24 HR) 0.3 mg/24hr patch Place 0.3 mg onto the skin once a week.   . clopidogrel (PLAVIX) 75 MG tablet Take 1 tablet (75 mg total) by mouth daily.  . cyanocobalamin (,VITAMIN B-12,) 1000 MCG/ML injection   . doxazosin (CARDURA) 8 MG tablet Take 1 tablet (8 mg total) by mouth daily.  Marland Kitchen  fluticasone (FLONASE) 50 MCG/ACT nasal spray Place into the nose.  . furosemide (LASIX) 20 MG tablet Take 1 tablet (20 mg total) by mouth 2 (two) times daily as needed.  Marland Kitchen glimepiride (AMARYL) 4 MG tablet Take 4 mg by mouth daily before breakfast.  .  guaiFENesin-dextromethorphan (ROBITUSSIN DM) 100-10 MG/5ML syrup Take 5 mLs by mouth every 4 (four) hours as needed for cough.  Marland Kitchen ipratropium-albuterol (DUONEB) 0.5-2.5 (3) MG/3ML SOLN Take 3 mLs by nebulization every 4 (four) hours as needed (shortness of breath/wheezing.).  Marland Kitchen levothyroxine (SYNTHROID, LEVOTHROID) 125 MCG tablet Take 125 mcg by mouth daily.  Marland Kitchen losartan-hydrochlorothiazide (HYZAAR) 100-25 MG per tablet Take 1 tablet by mouth daily.    . metFORMIN (GLUCOPHAGE) 1000 MG tablet Take 1,000 mg by mouth 2 (two) times daily with a meal.  . Multiple Vitamins-Minerals (PRESERVISION AREDS PO) Take by mouth 2 (two) times daily.  Marland Kitchen oxybutynin (DITROPAN) 5 MG tablet Take 5 mg by mouth 1 dose over 46 hours.    . pantoprazole (PROTONIX) 40 MG tablet Take 40 mg by mouth daily.   . pioglitazone (ACTOS) 15 MG tablet Take 15 mg by mouth daily.  . predniSONE (DELTASONE) 10 MG tablet Label  & dispense according to the schedule below. 5 Pills PO for 2 days then, 4 Pills PO for 2 days, 3 Pills PO for 2 days, 2 Pills PO for 2 days, 1 Pill PO for 2 days then STOP.  Marland Kitchen senna (SENOKOT) 8.6 MG TABS tablet Take 1 tablet (8.6 mg total) by mouth daily as needed for mild constipation.  . simvastatin (ZOCOR) 40 MG tablet Take 1 tablet (40 mg total) by mouth at bedtime. (Patient taking differently: Take 40 mg by mouth daily. )  . SYMBICORT 160-4.5 MCG/ACT inhaler Inhale 2 puffs into the lungs 2 (two) times daily.    No facility-administered encounter medications on file as of 10/26/2015.      Allergies:  Atenolol and Codeine  Review of Systems: Gen:  Denies  fever, sweats. HEENT: Denies blurred vision. Cvc:  No dizziness, chest pain or heaviness Resp:   Denies cough or sputum porduction. Gi: Denies swallowing difficulty, stomach pain. constipation, bowel incontinence Gu:  Denies bladder incontinence, burning urine Ext:   No Joint pain, stiffness. Skin: No skin rash, easy bruising. Endoc:  No polyuria,  polydipsia. Psych: No depression, insomnia. Other:  All other systems were reviewed and found to be negative other than what is mentioned in the HPI.   Physical Examination:   VS: There were no vitals taken for this visit.  General Appearance: No distress  Neuro:without focal findings,  speech normal,  HEENT: PERRLA, EOM intact. Pulmonary: normal breath sounds, No wheezing.   CardiovascularNormal S1,S2.  No m/r/g.   Abdomen: Benign, Soft, non-tender. Renal:  No costovertebral tenderness  GU:  Not performed at this time. Endoc: No evident thyromegaly, no signs of acromegaly. Skin:   warm, no rash. Extremities: normal, no cyanosis, clubbing.   LABORATORY PANEL:   CBC  Recent Labs Lab 10/22/15 1030  WBC 7.2  HGB 9.8*  HCT 29.0*  PLT 172   ------------------------------------------------------------------------------------------------------------------  Chemistries   Recent Labs Lab 10/22/15 1030  NA 138  K 4.1  CL 102  CO2 26  GLUCOSE 177*  BUN 43*  CREATININE 1.48*  CALCIUM 8.9  MG 2.1  AST 15  ALT 17  ALKPHOS 60  BILITOT 0.5   ------------------------------------------------------------------------------------------------------------------  Cardiac Enzymes No results for input(s): TROPONINI in the last 168 hours. ------------------------------------------------------------  RADIOLOGY:   No results found for this or any previous visit. Results for orders placed during the hospital encounter of 12/28/14  DG Chest 2 View   Narrative CLINICAL DATA:  Hemoptysis this morning ; shortness of breath on exertion, history of asthma, chronic CHF, obesity, and diabetes.  EXAM: CHEST  2 VIEW  COMPARISON:  Portable chest x-ray of October 11, 2013  FINDINGS: The lungs are well-expanded. There is no focal infiltrate. The interstitial markings are coarse. The heart is top-normal in size. The pulmonary vascularity is not engorged. There is no pleural effusion.  There is a moderate-sized hiatal hernia. There is apical pleural thickening bilaterally. There is mild degenerative disc space narrowing of the mid thoracic spine.  IMPRESSION: Acute on chronic bronchitic change.  There is no evidence of CHF.   Electronically Signed   By: David  Martinique M.D.   On: 12/28/2014 13:21    ------------------------------------------------------------------------------------------------------------------  Thank  you for allowing [facility] to assist in the care of your patient. Our recommendations are noted above.  Please contact us if we can be of further service.   Marda Stalker, MD.  [facility] Office Number: [phone]  Patricia Pesa, M.D.  Vilinda Boehringer, M.D.  Merton Border, M.D  10/25/2015

## 2015-10-26 ENCOUNTER — Inpatient Hospital Stay: Payer: Medicare Other | Admitting: Internal Medicine

## 2015-10-26 ENCOUNTER — Encounter
Admission: RE | Admit: 2015-10-26 | Discharge: 2015-10-26 | Disposition: A | Discharge status: Home / Self Care | Payer: Medicare Other | Source: Ambulatory Visit | Attending: Internal Medicine | Admitting: Internal Medicine

## 2015-10-26 ENCOUNTER — Encounter: Payer: Self-pay | Admitting: *Deleted

## 2015-10-26 LAB — GLUCOSE, CAPILLARY
Glucose-Capillary: 184 mg/dL — ABNORMAL HIGH (ref 65–99)
Glucose-Capillary: 404 mg/dL — ABNORMAL HIGH (ref 65–99)
Glucose-Capillary: 354 mg/dL — ABNORMAL HIGH (ref 65–99)

## 2015-10-27 LAB — GLUCOSE, CAPILLARY
GLUCOSE-CAPILLARY: 360 mg/dL — AB (ref 65–99)
GLUCOSE-CAPILLARY: 331 mg/dL — AB (ref 65–99)
Glucose-Capillary: 58 mg/dL — ABNORMAL LOW (ref 65–99)
Glucose-Capillary: 182 mg/dL — ABNORMAL HIGH (ref 65–99)

## 2015-10-28 LAB — GLUCOSE, CAPILLARY: GLUCOSE-CAPILLARY: 80 mg/dL (ref 65–99)

## 2015-10-29 LAB — GLUCOSE, CAPILLARY
GLUCOSE-CAPILLARY: 298 mg/dL — AB (ref 65–99)
Glucose-Capillary: 85 mg/dL (ref 65–99)
Glucose-Capillary: 253 mg/dL — ABNORMAL HIGH (ref 65–99)
Glucose-Capillary: 251 mg/dL — ABNORMAL HIGH (ref 65–99)

## 2015-10-30 ENCOUNTER — Non-Acute Institutional Stay (SKILLED_NURSING_FACILITY): Payer: Medicare Other | Admitting: Gerontology

## 2015-10-30 DIAGNOSIS — E559 Vitamin D deficiency, unspecified: Secondary | ICD-10-CM | POA: Diagnosis not present

## 2015-10-30 DIAGNOSIS — J441 Chronic obstructive pulmonary disease with (acute) exacerbation: Secondary | ICD-10-CM

## 2015-10-30 DIAGNOSIS — I5032 Chronic diastolic (congestive) heart failure: Secondary | ICD-10-CM | POA: Diagnosis not present

## 2015-10-30 LAB — GLUCOSE, CAPILLARY
GLUCOSE-CAPILLARY: 323 mg/dL — AB (ref 65–99)
Glucose-Capillary: 98 mg/dL (ref 65–99)
Glucose-Capillary: 277 mg/dL — ABNORMAL HIGH (ref 65–99)
Glucose-Capillary: 223 mg/dL — ABNORMAL HIGH (ref 65–99)

## 2015-10-30 NOTE — Progress Notes (Deleted)
[place]  [phone] [phone]  ID: Mellissa Kohut Wissner OB: 04/29/1927  MR#: [id number]  PNT#:[id number]  Patient Care Team: Leonel Ramsay, MD as PCP - General (Infectious Diseases) Minna Merritts, MD as Consulting Physician (Cardiology)  CHIEF COMPLAINT: Iron deficiency anemia.  INTERVAL HISTORY: Patient returns to clinic for evaluation of anemia. She continues to have complaints of increasing shortness of breath and weakness and fatigue. She has no neurologic complaints. She denies any recent fevers or illnesses. She denies any nausea, vomiting, constipation, or diarrhea. She has no urinary complaints. Patient otherwise feels well and offers no further specific complaints.  REVIEW OF SYSTEMS:   Review of Systems  Constitutional: Positive for malaise/fatigue.  HENT: Negative.   Respiratory: Positive for shortness of breath. Negative for cough and hemoptysis.   Cardiovascular: Negative.   Gastrointestinal: Negative.  Negative for blood in stool and melena.  Genitourinary: Negative.  Negative for hematuria.  Musculoskeletal: Negative.   Neurological: Negative.     As per HPI. Otherwise, a complete review of systems is negatve.  PAST MEDICAL HISTORY: Past Medical History:  Diagnosis Date  . Bladder incontinence   . CAD (coronary artery disease)    a. cardiac cath 05/2009: proximal LAD 90% stenosis s/p PCI/Xience 2.75 x 12 mm DES, mid LAD 40%, diagonal 40%, proximal LCx 40% followed by 40% LCx lesion, 30%-40%-30% lesion noted in the RCA with distal RCA 30% and 25% lesions  . Chronic diastolic (congestive) heart failure ([place])    a. Lexiscan 2013: no ischemia, normal EF b. echo 09/2015: EF 55-60% w/ Grade 1 DD  . CKD (chronic kidney disease) stage 3, GFR 30-59 ml/min   . Degenerative arthritis of knee    bilateral knees  . Diabetes mellitus    Type II  . Hiatal hernia   . Hypertension   . Iron deficiency   . Menopausal symptoms     . Morbid obesity ([place])   . Thyroid disease    hypothyroidism    PAST SURGICAL HISTORY: Past Surgical History:  Procedure Laterality Date  . COLONOSCOPY  2015  . REPLACEMENT TOTAL KNEE BILATERAL    . TOTAL VAGINAL HYSTERECTOMY     ovarian mass, not cancerous  . UPPER GI ENDOSCOPY  2015    FAMILY HISTORY Family History  Problem Relation Age of Onset  . Breast cancer Mother        ADVANCED DIRECTIVES:    HEALTH MAINTENANCE: Social History  Substance Use Topics  . Smoking status: Never Smoker  . Smokeless tobacco: Never Used  . Alcohol use No     Colonoscopy:  PAP:  Bone density:  Lipid panel:  Allergies  Allergen Reactions  . Atenolol     Other reaction(s): Other (See Comments) Decreased heart rate  . Codeine     Rash, difficulty breathing, nausea.    Current Outpatient Prescriptions  Medication Sig Dispense Refill  . amLODipine (NORVASC) 5 MG tablet TAKE ONE (1) TABLET BY MOUTH EVERY DAY FOR BLOOD PRESSURE 90 tablet 3  . aspirin 81 MG tablet Take 81 mg by mouth daily.    . chlorpheniramine-HYDROcodone (TUSSIONEX) 10-8 MG/5ML SUER Take 5 mLs by mouth every 12 (twelve) hours. 140 mL 0  . cloNIDine (CATAPRES - DOSED IN MG/24 HR) 0.3 mg/24hr patch Place 0.3 mg onto the skin once a week.     . clopidogrel (PLAVIX) 75 MG tablet Take 1 tablet (75 mg total) by mouth daily. 90 tablet 3  . cyanocobalamin (,VITAMIN  B-12,) 1000 MCG/ML injection     . doxazosin (CARDURA) 8 MG tablet Take 1 tablet (8 mg total) by mouth daily. 90 tablet 4  . fluticasone (FLONASE) 50 MCG/ACT nasal spray Place into the nose.    . furosemide (LASIX) 20 MG tablet Take 1 tablet (20 mg total) by mouth 2 (two) times daily as needed. 180 tablet 3  . glimepiride (AMARYL) 4 MG tablet Take 4 mg by mouth daily before breakfast.    . guaiFENesin-dextromethorphan (ROBITUSSIN DM) 100-10 MG/5ML syrup Take 5 mLs by mouth every 4 (four) hours as needed for cough. 118 mL 0  . ipratropium-albuterol (DUONEB)  0.5-2.5 (3) MG/3ML SOLN Take 3 mLs by nebulization every 4 (four) hours as needed (shortness of breath/wheezing.). 360 mL   . levothyroxine (SYNTHROID, LEVOTHROID) 125 MCG tablet Take 125 mcg by mouth daily.    Marland Kitchen losartan-hydrochlorothiazide (HYZAAR) 100-25 MG per tablet Take 1 tablet by mouth daily.      . metFORMIN (GLUCOPHAGE) 1000 MG tablet Take 1,000 mg by mouth 2 (two) times daily with a meal.    . Multiple Vitamins-Minerals (PRESERVISION AREDS PO) Take by mouth 2 (two) times daily.    Marland Kitchen oxybutynin (DITROPAN) 5 MG tablet Take 5 mg by mouth 1 dose over 46 hours.      . pantoprazole (PROTONIX) 40 MG tablet Take 40 mg by mouth daily.     . pioglitazone (ACTOS) 15 MG tablet Take 15 mg by mouth daily.    . predniSONE (DELTASONE) 10 MG tablet Label  & dispense according to the schedule below. 5 Pills PO for 2 days then, 4 Pills PO for 2 days, 3 Pills PO for 2 days, 2 Pills PO for 2 days, 1 Pill PO for 2 days then STOP.    Marland Kitchen senna (SENOKOT) 8.6 MG TABS tablet Take 1 tablet (8.6 mg total) by mouth daily as needed for mild constipation. 120 each 0  . simvastatin (ZOCOR) 40 MG tablet Take 1 tablet (40 mg total) by mouth at bedtime. (Patient taking differently: Take 40 mg by mouth daily. ) 90 tablet 3  . SYMBICORT 160-4.5 MCG/ACT inhaler Inhale 2 puffs into the lungs 2 (two) times daily.      No current facility-administered medications for this visit.     OBJECTIVE: There were no vitals filed for this visit.   There is no height or weight on file to calculate BMI.    ECOG FS:80 - Symptomatic, <50% confined to bed  General: Well-developed, well-nourished, no acute distress. Eyes: Pink conjunctiva, anicteric sclera. Lungs: Scattered wheezing. Heart: Regular rate and rhythm. No rubs, murmurs, or gallops. Abdomen: Soft, nontender, nondistended. No organomegaly noted, normoactive bowel sounds. Musculoskeletal: No edema, cyanosis, or clubbing. Neuro: Alert, answering all questions appropriately.  Cranial nerves grossly intact. Skin: No rashes or petechiae noted. Psych: Normal affect.  LAB RESULTS:  Lab Results  Component Value Date   NA 138 10/22/2015   K 4.1 10/22/2015   CL 102 10/22/2015   CO2 26 10/22/2015   GLUCOSE 177 (H) 10/22/2015   BUN 43 (H) 10/22/2015   CREATININE 1.48 (H) 10/22/2015   CALCIUM 8.9 10/22/2015   PROT 5.4 (L) 10/22/2015   ALBUMIN 3.5 10/22/2015   AST 15 10/22/2015   ALT 17 10/22/2015   ALKPHOS 60 10/22/2015   BILITOT 0.5 10/22/2015   GFRNONAA 31 (L) 10/22/2015   GFRAA 36 (L) 10/22/2015    Lab Results  Component Value Date   WBC 7.2 10/22/2015   NEUTROABS 5.5  10/22/2015   HGB 9.8 (L) 10/22/2015   HCT 29.0 (L) 10/22/2015   MCV 93.3 10/22/2015   PLT 172 10/22/2015   Lab Results  Component Value Date   IRON 38 10/22/2015   TIBC 306 10/22/2015   IRONPCTSAT 12 10/22/2015    Lab Results  Component Value Date   FERRITIN 16 08/01/2015    STUDIES: Dg Chest 1 View  Result Date: 10/08/2015 CLINICAL DATA:  82 year old female with shortness of breath chest pain and lower extremity swelling. Initial encounter. EXAM: CHEST 1 VIEW COMPARISON:  Chest CTA 10/01/2015 and earlier. FINDINGS: Portable AP upright view at 0618 hours. Moderate size gastric hiatal hernia is less apparent. Stable cardiac size and mediastinal contours. Calcified aortic atherosclerosis. Visualized tracheal air column is within normal limits. Regressed pulmonary vascularity since 10/01/2015. No pneumothorax. The right lung appears clear. However, there is new retrocardiac opacity at the left lung base partially obscuring the diaphragm. IMPRESSION: 1. New confluent left lung base opacity might reflect atelectasis, but is suspicious for aspiration or infection. 2.  Calcified aortic atherosclerosis. Electronically Signed   By: Genevie Ann M.D.   On: 10/08/2015 07:14   Ct Chest Wo Contrast  Result Date: 10/09/2015 CLINICAL DATA:  Shortness of breath, cough for a week EXAM: CT CHEST  WITHOUT CONTRAST TECHNIQUE: Multidetector CT imaging of the chest was performed following the standard protocol without IV contrast. COMPARISON:  Chest x-ray of 10/08/2015 FINDINGS: Cardiovascular: The mid ascending thoracic aorta measures 35 mm in diameter. There is calcification and probable stent within the left anterior descending coronary artery. The heart is within upper limits of normal. No pericardial effusion is seen. Calcification of the mitral annulus is noted Mediastinum/Nodes: No mediastinal or hilar adenopathy is seen. A pre trachea node is present of 10 mm in short axis diameter. A small to moderate size hiatal hernia is present. Lungs/Pleura: Biapical pleural-parenchymal scarring is noted. There is parenchymal opacity within the deep posterior medial left lower lobe most likely due to atelectasis or possibly a patchy area of pneumonia. In addition there is atelectasis and possible patchy pneumonia within the right middle lobe with air bronchograms present. The bronchus to the right middle lobe appears narrowed and partially occluded possibly due to a mucous plugging, but an endobronchial lesion cannot be excluded. Clinical followup and possible bronchoscopy may be helpful. Within the left lower lobe there is a 6 mm noncalcified nodule present on image 82. A 5 mm nodule is also noted within the left lower lobe on image 79. Series 3. A slight protrusion from the right superior mediastinum on image 25 appears to represent fat and is of doubtful significance. Upper Abdomen: There is calcification noted within the upper pole of the left kidney which could be dystrophic, but a calcified renal lesion cannot be excluded. Correlate clinically and consider CT the abdomen pelvis if warranted with IV contrast media. Musculoskeletal: The thoracic vertebrae are in normal alignment with diffuse degenerative spurring and degenerative disc disease. IMPRESSION: 1. Parenchymal opacity within the deep left lower lobe as  well as within the right middle lobe some which may be due to atelectasis, but pneumonia cannot be excluded. 2. Occlusion of the bronchus to the right middle lobe may represent mucous plugging although and endobronchial lesion cannot be excluded. 3. 6 mm and 5 mm noncalcified nodules within the left lower lobe. Non-contrast chest CT at 3-6 months is recommended. If the nodules are stable at time of repeat CT, then future CT at 18-24 months (from  today's scan) is considered optional for low-risk patients, but is recommended for high-risk patients. This recommendation follows the consensus statement: Guidelines for Management of Incidental Pulmonary Nodules Detected on CT Images:From the Fleischner Society 2017; published online before print (10.1148/[id number]). Calcification in the upper pole of the left kidney on the last few images. This could be dystrophic, but a calcified renal lesion cannot be excluded. Correlate clinically and consider CT of the abdomen pelvis with IV contrast. 4. Moderate thoracic aortic atherosclerosis. Electronically Signed   By: Ivar Drape M.D.   On: 10/09/2015 12:08   Ct Angio Chest Pe W Or Wo Contrast  Result Date: 10/01/2015 CLINICAL DATA:  Chest pain and short of breath EXAM: CT ANGIOGRAPHY CHEST WITH CONTRAST TECHNIQUE: Multidetector CT imaging of the chest was performed using the standard protocol during bolus administration of intravenous contrast. Multiplanar CT image reconstructions and MIPs were obtained to evaluate the vascular anatomy. CONTRAST:  60 cc Isovue 370 COMPARISON:  None. FINDINGS: There are no filling defects in the pulmonary arterial tree to suggest acute pulmonary thromboembolism There is no evidence of aortic dissection or aneurysm. Great vessels are grossly patent. There is no abnormal mediastinal adenopathy. No pericardial effusion. Three vessel coronary artery calcification. Mitral annular calcification. Mild peripheral aortic valvular calcification.  Large hiatal hernia No pneumothorax or pleural effusion. 6 mm right upper pole nodule on image 17. 6 mm left lower lobe pulmonary nodule on image 52. 7 mm left lower lobe pulmonary nodule on image 52. No acute bony deformity. Review of the MIP images confirms the above findings. IMPRESSION: No evidence of acute pulmonary thromboembolism Hiatal hernia. Multiple bilateral small pulmonary nodules. The largest in the left lower lobe measures 7 mm. Non-contrast chest CT at 3-6 months is recommended. If the nodules are stable at time of repeat CT, then future CT at 18-24 months (from today's scan) is considered optional for low-risk patients, but is recommended for high-risk patients. This recommendation follows the consensus statement: Guidelines for Management of Incidental Pulmonary Nodules Detected on CT Images:From the Fleischner Society 2017; published online before print (10.1148/[id number]). Electronically Signed   By: Marybelle Killings M.D.   On: 10/01/2015 16:09   US Venous Img Lower Bilateral  Result Date: 10/01/2015 CLINICAL DATA:  Bilateral lower extremity edema, left greater than right. Evaluate for DVT. EXAM: BILATERAL LOWER EXTREMITY VENOUS DOPPLER ULTRASOUND TECHNIQUE: Gray-scale sonography with graded compression, as well as color Doppler and duplex ultrasound were performed to evaluate the lower extremity deep venous systems from the level of the common femoral vein and including the common femoral, femoral, profunda femoral, popliteal and calf veins including the posterior tibial, peroneal and gastrocnemius veins when visible. The superficial great saphenous vein was also interrogated. Spectral Doppler was utilized to evaluate flow at rest and with distal augmentation maneuvers in the common femoral, femoral and popliteal veins. COMPARISON:  Left lower extremity venous Doppler ultrasound - 10/12/2013 FINDINGS: Examination is degraded due to patient body habitus and poor sonographic window. RIGHT  LOWER EXTREMITY Common Femoral Vein: No evidence of thrombus. Normal compressibility, respiratory phasicity and response to augmentation. Saphenofemoral Junction: No evidence of thrombus. Normal compressibility and flow on color Doppler imaging. Profunda Femoral Vein: No evidence of thrombus. Normal compressibility and flow on color Doppler imaging. Femoral Vein: No evidence of thrombus. Normal compressibility, respiratory phasicity and response to augmentation. Popliteal Vein: No evidence of thrombus. Normal compressibility, respiratory phasicity and response to augmentation. Calf Veins: No evidence of thrombus. Normal compressibility and flow on color  Doppler imaging. Superficial Great Saphenous Vein: No evidence of thrombus. Normal compressibility and flow on color Doppler imaging. Venous Reflux:  None. Other Findings:  None. LEFT LOWER EXTREMITY Common Femoral Vein: No evidence of thrombus. Normal compressibility, respiratory phasicity and response to augmentation. Saphenofemoral Junction: No evidence of thrombus. Normal compressibility and flow on color Doppler imaging. Profunda Femoral Vein: No evidence of thrombus. Normal compressibility and flow on color Doppler imaging. Femoral Vein: No evidence of thrombus. Normal compressibility, respiratory phasicity and response to augmentation. Popliteal Vein: No evidence of thrombus. Normal compressibility, respiratory phasicity and response to augmentation. Calf Veins: No evidence of thrombus. Normal compressibility and flow on color Doppler imaging. Superficial Great Saphenous Vein: No evidence of thrombus. Normal compressibility and flow on color Doppler imaging. Venous Reflux:  None. Other Findings:  None. IMPRESSION: No evidence of DVT within either lower extremity. Electronically Signed   By: Sandi Mariscal M.D.   On: 10/01/2015 15:45   Dg Chest Portable 1 View  Result Date: 10/01/2015 CLINICAL DATA:  82 year old female with lower extremity swelling for several  days, shortness of breath and chest pain. Initial encounter. EXAM: PORTABLE CHEST 1 VIEW COMPARISON:  12/28/2014 and earlier. FINDINGS: Portable AP upright view at 0850 hours. Chronic hiatal hernia. Stable cardiac size and mediastinal contours. Calcified aortic atherosclerosis. Mild acute increased interstitial opacity diffusely. No pneumothorax, pleural effusion or consolidation. IMPRESSION: 1. Mild pulmonary interstitial edema suspected. No other acute cardiopulmonary abnormality. 2.  Calcified aortic atherosclerosis. Electronically Signed   By: Genevie Ann M.D.   On: 10/01/2015 09:05    ASSESSMENT:  Iron deficiency anemia, shortness of breath.   PLAN:    1. Iron deficiency anemia: Previously, patient was reported to be intolerant of oral iron supplementation. She also had an EGD and colonoscopy in May 2015 for heme positive stools but only revealed esophagitis and tubular adenomas in her colon. Patient's hemoglobin and iron stores continue to be decreased. Proceed with 510 mg IV Feraheme and then return next week for second infusion. Return to clinic monthly for laboratory and in 3 months with repeat laboratory work and further evaluation. 2. Shortness of breath:  Continue treatment per PCP.  Patient expressed understanding and was in agreement with this plan. She also understands that She can call clinic at any time with any questions, concerns, or complaints.    Lloyd Huger, MD   10/30/2015 11:50 PM

## 2015-10-31 ENCOUNTER — Inpatient Hospital Stay: Payer: Medicare Other

## 2015-10-31 ENCOUNTER — Inpatient Hospital Stay: Payer: Medicare Other | Admitting: Oncology

## 2015-10-31 LAB — GLUCOSE, CAPILLARY
Glucose-Capillary: 87 mg/dL (ref 65–99)
Glucose-Capillary: 255 mg/dL — ABNORMAL HIGH (ref 65–99)

## 2015-11-01 ENCOUNTER — Telehealth: Payer: Self-pay | Admitting: Family

## 2015-11-01 NOTE — Telephone Encounter (Signed)
Patient called regarding a missed appointment at the end of August. She says that she was in the hospital at that time and just came home last night. The [facility] was explained to the patient but she says that she just can't come at this time due to other health concerns. Advised patient to keep our number and call us back when she gets to feeling better.

## 2015-11-02 DIAGNOSIS — I5033 Acute on chronic diastolic (congestive) heart failure: Secondary | ICD-10-CM | POA: Diagnosis not present

## 2015-11-02 DIAGNOSIS — I130 Hypertensive heart and chronic kidney disease with heart failure and stage 1 through stage 4 chronic kidney disease, or unspecified chronic kidney disease: Secondary | ICD-10-CM | POA: Diagnosis not present

## 2015-11-02 DIAGNOSIS — R531 Weakness: Secondary | ICD-10-CM | POA: Diagnosis not present

## 2015-11-02 DIAGNOSIS — Z7982 Long term (current) use of aspirin: Secondary | ICD-10-CM | POA: Diagnosis not present

## 2015-11-02 DIAGNOSIS — J449 Chronic obstructive pulmonary disease, unspecified: Secondary | ICD-10-CM | POA: Diagnosis not present

## 2015-11-02 DIAGNOSIS — Z7902 Long term (current) use of antithrombotics/antiplatelets: Secondary | ICD-10-CM | POA: Diagnosis not present

## 2015-11-02 DIAGNOSIS — Z7984 Long term (current) use of oral hypoglycemic drugs: Secondary | ICD-10-CM | POA: Diagnosis not present

## 2015-11-02 DIAGNOSIS — I2510 Atherosclerotic heart disease of native coronary artery without angina pectoris: Secondary | ICD-10-CM | POA: Diagnosis not present

## 2015-11-02 DIAGNOSIS — E1122 Type 2 diabetes mellitus with diabetic chronic kidney disease: Secondary | ICD-10-CM | POA: Diagnosis not present

## 2015-11-02 DIAGNOSIS — N183 Chronic kidney disease, stage 3 (moderate): Secondary | ICD-10-CM | POA: Diagnosis not present

## 2015-11-02 DIAGNOSIS — Z9181 History of falling: Secondary | ICD-10-CM | POA: Diagnosis not present

## 2015-11-02 DIAGNOSIS — E119 Type 2 diabetes mellitus without complications: Secondary | ICD-10-CM | POA: Diagnosis not present

## 2015-11-02 DIAGNOSIS — L89152 Pressure ulcer of sacral region, stage 2: Secondary | ICD-10-CM | POA: Diagnosis not present

## 2015-11-02 DIAGNOSIS — I4891 Unspecified atrial fibrillation: Secondary | ICD-10-CM | POA: Diagnosis not present

## 2015-11-04 DIAGNOSIS — E559 Vitamin D deficiency, unspecified: Secondary | ICD-10-CM | POA: Insufficient documentation

## 2015-11-04 NOTE — Progress Notes (Signed)
Location:      Place of Service:  SNF (31)  Provider: Toni Arthurs, NP-C  PCP: Leonel Ramsay, MD Patient Care Team: Leonel Ramsay, MD as PCP - General (Infectious Diseases) Minna Merritts, MD as Consulting Physician (Cardiology)  Extended Emergency Contact Information Primary Emergency Contact: Hazel Sams Address: [place] [address] [place] Phone: [phone] Relation: Daughter  Code Status: dnr Goals of care:  Advanced Directive information Advanced Directives 10/01/2015  Does patient have an advance directive? Yes  Type of Advance Directive [place]  Does patient want to make changes to advanced directive? -  Copy of advanced directive(s) in chart? No - copy requested  Would patient like information on creating an advanced directive? -     Allergies  Allergen Reactions  . Atenolol     Other reaction(s): Other (See Comments) Decreased heart rate  . Codeine     Rash, difficulty breathing, nausea.    Chief Complaint  Patient presents with  . Discharge Note    HPI:  82 y.o. female at the facility for rehab following hospitalization for COPD exacerbation, CHF exacerbation, deconditioning. She progressed well with therapy. She did have another COPD exacerbation while at the facility, but responded well to Solumedrol injection, pred taper, Lasix injection and nebulizer treatments. She remained on O2 2L [place] until the past 3 days of her stay, when oxygen was taken off d/t stable sats. However the oxygen was intended for the dyspnea with ambulation of short distances and extended recovery time. Then, upon discharge, pt refused home oxygen. Pt also appears to have significant depression and is "stuck" in the early grieving stages of her daughter's death 2 years ago. Pt refused anti-depressant. She does endorse depression but says she "deals with it." Pt is anxious to return home and feels  she is ready to discharge. VSS.      Past Medical History:  Diagnosis Date  . Bladder incontinence   . CAD (coronary artery disease)    a. cardiac cath 05/2009: proximal LAD 90% stenosis s/p PCI/Xience 2.75 x 12 mm DES, mid LAD 40%, diagonal 40%, proximal LCx 40% followed by 40% LCx lesion, 30%-40%-30% lesion noted in the RCA with distal RCA 30% and 25% lesions  . Chronic diastolic (congestive) heart failure ([place])    a. Lexiscan 2013: no ischemia, normal EF b. echo 09/2015: EF 55-60% w/ Grade 1 DD  . CKD (chronic kidney disease) stage 3, GFR 30-59 ml/min   . Degenerative arthritis of knee    bilateral knees  . Diabetes mellitus    Type II  . Hiatal hernia   . Hypertension   . Iron deficiency   . Menopausal symptoms   . Morbid obesity ([place])   . Thyroid disease    hypothyroidism    Past Surgical History:  Procedure Laterality Date  . COLONOSCOPY  2015  . REPLACEMENT TOTAL KNEE BILATERAL    . TOTAL VAGINAL HYSTERECTOMY     ovarian mass, not cancerous  . UPPER GI ENDOSCOPY  2015      reports that she has never smoked. She has never used smokeless tobacco. She reports that she does not drink alcohol or use drugs. Social History   Social History  . Marital status: Widowed    Spouse name: N/A  . Number of children: N/A  . Years of education: N/A   Occupational History  . Not  on file.   Social History Main Topics  . Smoking status: Never Smoker  . Smokeless tobacco: Never Used  . Alcohol use No  . Drug use: No  . Sexual activity: Not on file   Other Topics Concern  . Not on file   Social History Narrative  . No narrative on file   Functional Status Survey:    Allergies  Allergen Reactions  . Atenolol     Other reaction(s): Other (See Comments) Decreased heart rate  . Codeine     Rash, difficulty breathing, nausea.    Pertinent  Health Maintenance Due  Topic Date Due  . FOOT EXAM  03/18/1937  . OPHTHALMOLOGY EXAM  03/18/1937  . DEXA SCAN  03/17/1994    . PNA vac Low Risk Adult (1 of 2 - PCV13) 03/17/1994  . INFLUENZA VACCINE  09/25/2015  . HEMOGLOBIN A1C  04/02/2016    Medications:   Medication List       Accurate as of 10/30/15 11:59 PM. Always use your most recent med list.          amLODipine 5 MG tablet Commonly known as:  NORVASC TAKE ONE (1) TABLET BY MOUTH EVERY DAY FOR BLOOD PRESSURE   aspirin 81 MG tablet Take 81 mg by mouth daily.   chlorpheniramine-HYDROcodone 10-8 MG/5ML Suer Commonly known as:  TUSSIONEX Take 5 mLs by mouth every 12 (twelve) hours.   cloNIDine 0.3 mg/24hr patch Commonly known as:  CATAPRES - Dosed in mg/24 hr Place 0.3 mg onto the skin once a week.   clopidogrel 75 MG tablet Commonly known as:  PLAVIX Take 1 tablet (75 mg total) by mouth daily.   cyanocobalamin 1000 MCG/ML injection Commonly known as:  (VITAMIN B-12)   doxazosin 8 MG tablet Commonly known as:  CARDURA Take 1 tablet (8 mg total) by mouth daily.   fluticasone 50 MCG/ACT nasal spray Commonly known as:  FLONASE Place into the nose.   furosemide 20 MG tablet Commonly known as:  LASIX Take 1 tablet (20 mg total) by mouth 2 (two) times daily as needed.   glimepiride 4 MG tablet Commonly known as:  AMARYL Take 4 mg by mouth daily before breakfast.   guaiFENesin-dextromethorphan 100-10 MG/5ML syrup Commonly known as:  ROBITUSSIN DM Take 5 mLs by mouth every 4 (four) hours as needed for cough.   ipratropium-albuterol 0.5-2.5 (3) MG/3ML Soln Commonly known as:  DUONEB Take 3 mLs by nebulization every 4 (four) hours as needed (shortness of breath/wheezing.).   levothyroxine 125 MCG tablet Commonly known as:  SYNTHROID, LEVOTHROID Take 125 mcg by mouth daily.   losartan-hydrochlorothiazide 100-25 MG tablet Commonly known as:  HYZAAR Take 1 tablet by mouth daily.   metFORMIN 1000 MG tablet Commonly known as:  GLUCOPHAGE Take 1,000 mg by mouth 2 (two) times daily with a meal.   oxybutynin 5 MG tablet Commonly  known as:  DITROPAN Take 5 mg by mouth 1 dose over 46 hours.   pantoprazole 40 MG tablet Commonly known as:  PROTONIX Take 40 mg by mouth daily.   pioglitazone 15 MG tablet Commonly known as:  ACTOS Take 15 mg by mouth daily.   predniSONE 10 MG tablet Commonly known as:  DELTASONE Label  & dispense according to the schedule below. 5 Pills PO for 2 days then, 4 Pills PO for 2 days, 3 Pills PO for 2 days, 2 Pills PO for 2 days, 1 Pill PO for 2 days then STOP.   PRESERVISION AREDS  PO Take by mouth 2 (two) times daily.   senna 8.6 MG Tabs tablet Commonly known as:  SENOKOT Take 1 tablet (8.6 mg total) by mouth daily as needed for mild constipation.   simvastatin 40 MG tablet Commonly known as:  ZOCOR Take 1 tablet (40 mg total) by mouth at bedtime.   SYMBICORT 160-4.5 MCG/ACT inhaler Generic drug:  budesonide-formoterol Inhale 2 puffs into the lungs 2 (two) times daily.       Review of Systems  Constitutional: Negative for activity change, appetite change, chills, diaphoresis and fever.  HENT: Negative for congestion, sneezing, sore throat, trouble swallowing and voice change.   Eyes: Negative for pain, redness and visual disturbance.  Respiratory: Negative for apnea, cough, choking, chest tightness, shortness of breath and wheezing.   Cardiovascular: Negative for chest pain, palpitations and leg swelling.  Gastrointestinal: Negative for abdominal distention, abdominal pain, constipation, diarrhea and nausea.  Genitourinary: Negative for difficulty urinating, dysuria, frequency and urgency.  Musculoskeletal: Negative for back pain, gait problem and myalgias. Arthralgias: typical arthritis.  Skin: Negative for color change, pallor, rash and wound.  Neurological: Negative for dizziness, tremors, syncope, speech difficulty, weakness, numbness and headaches.  Psychiatric/Behavioral: Negative for agitation and behavioral problems.  All other systems reviewed and are  negative.   Vitals:   10/30/15 0500  BP: (!) 119/26  Pulse: 86  Resp: 20  Temp: 98.1 F (36.7 C)  SpO2: 97%  Weight: 243 lb 11.2 oz (110.5 kg)   Body mass index is 44.57 kg/m. Physical Exam  Constitutional: She is oriented to person, place, and time. Vital signs are normal. She appears well-developed and well-nourished. She is active and cooperative. She does not appear ill. No distress. Nasal cannula in place.  HENT:  Head: Normocephalic and atraumatic.  Mouth/Throat: Uvula is midline, oropharynx is clear and moist and mucous membranes are normal. Mucous membranes are not pale, not dry and not cyanotic.  Eyes: Conjunctivae, EOM and lids are normal. Pupils are equal, round, and reactive to light.  Neck: Trachea normal, normal range of motion and full passive range of motion without pain. Neck supple. No JVD present. No tracheal deviation, no edema and no erythema present. No thyromegaly present.  Cardiovascular: Normal rate, regular rhythm, normal heart sounds, intact distal pulses and normal pulses.  Exam reveals no gallop, no distant heart sounds and no friction rub.   No murmur heard. Pulmonary/Chest: Effort normal. No accessory muscle usage. No respiratory distress. She has no decreased breath sounds. She has no wheezes. She has no rhonchi. She has no rales. She exhibits no tenderness.  Abdominal: Soft. Normal appearance and bowel sounds are normal. She exhibits no distension and no ascites. There is no tenderness.  Musculoskeletal: Normal range of motion. She exhibits no edema or tenderness.  Expected osteoarthritis, stiffness  Neurological: She is alert and oriented to person, place, and time. She has normal strength.  Skin: Skin is warm, dry and intact. No rash noted. She is not diaphoretic. No cyanosis or erythema. No pallor. Nails show no clubbing.  Psychiatric: She has a normal mood and affect. Her speech is normal and behavior is normal. Judgment and thought content normal.  Cognition and memory are normal.  Nursing note and vitals reviewed.   Labs reviewed: Basic Metabolic Panel:  Recent Labs  10/04/15 0337  10/06/15 0506 10/07/15 0512 10/08/15 0448 10/11/15 0331 10/22/15 1030  NA 137  < > 141 139  --   --  138  K 4.3  < >  4.1 4.8  --   --  4.1  CL 102  < > 102 103  --   --  102  CO2 26  < > 29 27  --   --  26  GLUCOSE 195*  < > 75 266*  --   --  177*  BUN 73*  < > 63* 61*  --   --  43*  CREATININE 1.80*  < > 1.59* 1.53* 1.54* 1.56* 1.48*  CALCIUM 8.1*  < > 8.4* 8.4*  --   --  8.9  MG 2.2  --   --   --   --   --  2.1  < > = values in this interval not displayed. Liver Function Tests:  Recent Labs  10/01/15 0847 10/22/15 1030  AST 18 15  ALT 14 17  ALKPHOS 81 60  BILITOT 0.3 0.5  PROT 6.4* 5.4*  ALBUMIN 3.7 3.5   No results for input(s): LIPASE, AMYLASE in the last 8760 hours. No results for input(s): AMMONIA in the last 8760 hours. CBC:  Recent Labs  07/03/15 1043 08/01/15 1025  10/04/15 0337  10/08/15 0537 10/10/15 1411 10/22/15 1030  WBC 8.5 8.5  < > 12.1*  --  16.3*  --  7.2  NEUTROABS 5.4 5.4  --   --   --   --   --  5.5  HGB 12.4 10.7*  < > 9.5*  < > 10.1* 9.8* 9.8*  HCT 37.5 32.6*  < > 28.0*  --  30.3*  --  29.0*  MCV 89.2 87.4  < > 93.1  --  93.6  --  93.3  PLT 248 324  < > 223  --  259  --  172  < > = values in this interval not displayed. Cardiac Enzymes:  Recent Labs  10/01/15 1224 10/01/15 1743 10/01/15 2346  TROPONINI <0.03 <0.03 <0.03   BNP: Invalid input(s): POCBNP CBG:  Recent Labs  10/30/15 2031 10/31/15 0729 10/31/15 1227  GLUCAP 223* 87 255*    Procedures and Imaging Studies During Stay: Dg Chest 1 View  Result Date: 10/08/2015 CLINICAL DATA:  82 year old female with shortness of breath chest pain and lower extremity swelling. Initial encounter. EXAM: CHEST 1 VIEW COMPARISON:  Chest CTA 10/01/2015 and earlier. FINDINGS: Portable AP upright view at 0618 hours. Moderate size gastric hiatal  hernia is less apparent. Stable cardiac size and mediastinal contours. Calcified aortic atherosclerosis. Visualized tracheal air column is within normal limits. Regressed pulmonary vascularity since 10/01/2015. No pneumothorax. The right lung appears clear. However, there is new retrocardiac opacity at the left lung base partially obscuring the diaphragm. IMPRESSION: 1. New confluent left lung base opacity might reflect atelectasis, but is suspicious for aspiration or infection. 2.  Calcified aortic atherosclerosis. Electronically Signed   By: Genevie Ann M.D.   On: 10/08/2015 07:14   Ct Chest Wo Contrast  Result Date: 10/09/2015 CLINICAL DATA:  Shortness of breath, cough for a week EXAM: CT CHEST WITHOUT CONTRAST TECHNIQUE: Multidetector CT imaging of the chest was performed following the standard protocol without IV contrast. COMPARISON:  Chest x-ray of 10/08/2015 FINDINGS: Cardiovascular: The mid ascending thoracic aorta measures 35 mm in diameter. There is calcification and probable stent within the left anterior descending coronary artery. The heart is within upper limits of normal. No pericardial effusion is seen. Calcification of the mitral annulus is noted Mediastinum/Nodes: No mediastinal or hilar adenopathy is seen. A pre trachea node is present of 10 mm  in short axis diameter. A small to moderate size hiatal hernia is present. Lungs/Pleura: Biapical pleural-parenchymal scarring is noted. There is parenchymal opacity within the deep posterior medial left lower lobe most likely due to atelectasis or possibly a patchy area of pneumonia. In addition there is atelectasis and possible patchy pneumonia within the right middle lobe with air bronchograms present. The bronchus to the right middle lobe appears narrowed and partially occluded possibly due to a mucous plugging, but an endobronchial lesion cannot be excluded. Clinical followup and possible bronchoscopy may be helpful. Within the left lower lobe there  is a 6 mm noncalcified nodule present on image 82. A 5 mm nodule is also noted within the left lower lobe on image 79. Series 3. A slight protrusion from the right superior mediastinum on image 25 appears to represent fat and is of doubtful significance. Upper Abdomen: There is calcification noted within the upper pole of the left kidney which could be dystrophic, but a calcified renal lesion cannot be excluded. Correlate clinically and consider CT the abdomen pelvis if warranted with IV contrast media. Musculoskeletal: The thoracic vertebrae are in normal alignment with diffuse degenerative spurring and degenerative disc disease. IMPRESSION: 1. Parenchymal opacity within the deep left lower lobe as well as within the right middle lobe some which may be due to atelectasis, but pneumonia cannot be excluded. 2. Occlusion of the bronchus to the right middle lobe may represent mucous plugging although and endobronchial lesion cannot be excluded. 3. 6 mm and 5 mm noncalcified nodules within the left lower lobe. Non-contrast chest CT at 3-6 months is recommended. If the nodules are stable at time of repeat CT, then future CT at 18-24 months (from today's scan) is considered optional for low-risk patients, but is recommended for high-risk patients. This recommendation follows the consensus statement: Guidelines for Management of Incidental Pulmonary Nodules Detected on CT Images:From the Fleischner Society 2017; published online before print (10.1148/radiol.2536644034). Calcification in the upper pole of the left kidney on the last few images. This could be dystrophic, but a calcified renal lesion cannot be excluded. Correlate clinically and consider CT of the abdomen pelvis with IV contrast. 4. Moderate thoracic aortic atherosclerosis. Electronically Signed   By: Ivar Drape M.D.   On: 10/09/2015 12:08    Assessment/Plan:   1. COPD exacerbation (HCC)  o2 2L [place]   Continue Symbicort inhalers BID  2. Chronic  diastolic CHF (congestive heart failure) (HCC)  Schedule Lasix BID instead of prn  Schedule potassium daily  Elevate legs when at rest  3. Vitamin D deficiency  Cholecalciferol 2,000 units- 3 capsules (6,000 units) once daily x 8 weeks, then  Cholecalciferol 2,000 units PO Q day  Have PCP perform f/u vitamin D level in 8-10 weeks  Patient is being discharged with the following home health services:  HHPT/OT  Patient is being discharged with the following durable medical equipment:  None  Of note: It was recommended to pt to d/c home with oxygen d/t her dyspnea with ambulation of short distances and extended recovery time. However, pt refused despite extensive education on the need. Pt said she wanted to wait to see how she did at home without it and the opinion of pulmonologist.  Patient has been advised to f/u with their PCP in 1-2 weeks to bring them up to date on their rehab stay.  Social services at facility was responsible for arranging this appointment.  Pt was provided with a 30 day supply of prescriptions for  medications and refills must be obtained from their PCP.  For controlled substances, a more limited supply may be provided adequate until PCP appointment only.  Future labs/tests needed:  Vitamin D level in 8-10 weeks; Met B- 1 week to evaluate K+ level  Family/ staff Communication:   Total Time:  Documentation:  Face to Face:  Family/Phone:  Vikki Ports, NP-C [facility] [address], [place] [address] Cell Phone (Mon-Fri 8am-5pm):  [phone] On Call:  [phone] & follow prompts after 5pm & weekends Office Phone:  [phone] Office Fax:  [phone]

## 2015-11-05 DIAGNOSIS — I5033 Acute on chronic diastolic (congestive) heart failure: Secondary | ICD-10-CM | POA: Diagnosis not present

## 2015-11-05 DIAGNOSIS — R531 Weakness: Secondary | ICD-10-CM | POA: Diagnosis not present

## 2015-11-05 DIAGNOSIS — I130 Hypertensive heart and chronic kidney disease with heart failure and stage 1 through stage 4 chronic kidney disease, or unspecified chronic kidney disease: Secondary | ICD-10-CM | POA: Diagnosis not present

## 2015-11-05 DIAGNOSIS — L89152 Pressure ulcer of sacral region, stage 2: Secondary | ICD-10-CM | POA: Diagnosis not present

## 2015-11-05 DIAGNOSIS — J449 Chronic obstructive pulmonary disease, unspecified: Secondary | ICD-10-CM | POA: Diagnosis not present

## 2015-11-05 DIAGNOSIS — N183 Chronic kidney disease, stage 3 (moderate): Secondary | ICD-10-CM | POA: Diagnosis not present

## 2015-11-06 ENCOUNTER — Telehealth: Payer: Self-pay | Admitting: Family

## 2015-11-06 DIAGNOSIS — R531 Weakness: Secondary | ICD-10-CM | POA: Diagnosis not present

## 2015-11-06 DIAGNOSIS — J449 Chronic obstructive pulmonary disease, unspecified: Secondary | ICD-10-CM | POA: Diagnosis not present

## 2015-11-06 DIAGNOSIS — I5033 Acute on chronic diastolic (congestive) heart failure: Secondary | ICD-10-CM | POA: Diagnosis not present

## 2015-11-06 DIAGNOSIS — I130 Hypertensive heart and chronic kidney disease with heart failure and stage 1 through stage 4 chronic kidney disease, or unspecified chronic kidney disease: Secondary | ICD-10-CM | POA: Diagnosis not present

## 2015-11-06 DIAGNOSIS — L89152 Pressure ulcer of sacral region, stage 2: Secondary | ICD-10-CM | POA: Diagnosis not present

## 2015-11-06 DIAGNOSIS — N183 Chronic kidney disease, stage 3 (moderate): Secondary | ICD-10-CM | POA: Diagnosis not present

## 2015-11-06 NOTE — Telephone Encounter (Signed)
Patient called to see about her missed appointment in August 2017. Explained the services of the [facility] and she says that she would like to hold off making an appointment at this time. She says that she's been in the hospital and rehab for the last 31 days and just got home. She does have our phone number and I told her that she could call back at any time to make an appointment.

## 2015-11-08 DIAGNOSIS — I130 Hypertensive heart and chronic kidney disease with heart failure and stage 1 through stage 4 chronic kidney disease, or unspecified chronic kidney disease: Secondary | ICD-10-CM | POA: Diagnosis not present

## 2015-11-08 DIAGNOSIS — I5033 Acute on chronic diastolic (congestive) heart failure: Secondary | ICD-10-CM | POA: Diagnosis not present

## 2015-11-08 DIAGNOSIS — N183 Chronic kidney disease, stage 3 (moderate): Secondary | ICD-10-CM | POA: Diagnosis not present

## 2015-11-08 DIAGNOSIS — J449 Chronic obstructive pulmonary disease, unspecified: Secondary | ICD-10-CM | POA: Diagnosis not present

## 2015-11-08 DIAGNOSIS — R531 Weakness: Secondary | ICD-10-CM | POA: Diagnosis not present

## 2015-11-08 DIAGNOSIS — L89152 Pressure ulcer of sacral region, stage 2: Secondary | ICD-10-CM | POA: Diagnosis not present

## 2015-11-09 DIAGNOSIS — N183 Chronic kidney disease, stage 3 (moderate): Secondary | ICD-10-CM | POA: Diagnosis not present

## 2015-11-09 DIAGNOSIS — R531 Weakness: Secondary | ICD-10-CM | POA: Diagnosis not present

## 2015-11-09 DIAGNOSIS — J449 Chronic obstructive pulmonary disease, unspecified: Secondary | ICD-10-CM | POA: Diagnosis not present

## 2015-11-09 DIAGNOSIS — I130 Hypertensive heart and chronic kidney disease with heart failure and stage 1 through stage 4 chronic kidney disease, or unspecified chronic kidney disease: Secondary | ICD-10-CM | POA: Diagnosis not present

## 2015-11-09 DIAGNOSIS — L89152 Pressure ulcer of sacral region, stage 2: Secondary | ICD-10-CM | POA: Diagnosis not present

## 2015-11-09 DIAGNOSIS — I5033 Acute on chronic diastolic (congestive) heart failure: Secondary | ICD-10-CM | POA: Diagnosis not present

## 2015-11-09 NOTE — Progress Notes (Signed)
* [facility]     Assessment and Plan:  Atelectasis.  -Review CT of the chest shows atelectasis of the medial segment of the right middle lobe. This is most likely due to external compression, however an endobronchial tumor cannot be ruled out based on the scan. -She was encouraged to use her incentive spirometry and flutter valve. Continue PT.  -Given the presence of lung nodules, atelectasis, paratracheal lymphadenopathy, we could repeat CT but given advanced age, multiple comorbidites and presence of plavix, she would be at high risk of complications. Also, it is unlikely that any intervention are likely to prolong her life, therefore will defer bronchoscopy/repeat CT scanning at this time.   Asthma.  -Continue symbicort bid.  --Patient will be given flu and Prevnar-13 vaccines today.   Hiatal hernia/intrathoracic stomach. -Patient has a significant hiatal hernia, which is likely contributing to dyspnea and atelectasis. -The patient is likely at high risk of aspiration, continue to treat with antireflux measures. -Patient is no longer on protonix, will start famotidine.   CHF with cardiomegaly. -Patient has significant cardiomegaly noted on CT of the chest, this likely attributing to atelectasis.  Obesity. -BMI = 46 -Likely contributing to dyspnea, weight loss would be beneficial for her dyspnea. -Obesity is likely contributing to atelectasis due to external compression from chest wall and diaphragm.  Dyspnea. -The patient's dyspnea is likely multifactorial from cardiomegaly with chronic diastolic congestive heart failure, bronchitis, atelectasis, morbid obesity, deconditioning. -The patient would benefit from continued physical therapy, and advancing activity.   Date: 11/09/2015  MRN# [id number] Sabria Florido [place] November 30, 1927   Rhian C Geigle is a 82 y.o. old female seen in follow up for chief complaint of  Chief Complaint  Patient presents with    . Hospitalization Follow-up    SOB w/excertion; no other complaints     HPI:   Patient is a 82 year old female, she is a nonsmoker but lived with a smoker in the past. She was admitted with symptoms of acute bronchitis and congestive heart failure on 8/7. She was seen in the hospital and was noted to have bronchitis, obesity, RML atelectasis after being int he hospital for a week. It was thought that endobronchial tumour could not be ruled out, therefore I asked that she repeat a CT chest in 6 weeks. She is following now as a hospital follow up.   She notes that she continues to have dyspnea with activity. She has completed PT, and continues to have dyspnea with activity and is still doing home PT. Her weight has been been stable.   Reviewed images of CT chest 10/09/15 in comparison with previous images mild RML atelectasis, no specific endobronchial lesion is noted.  This was likely due to atelectasis secondary to immobility from hospitalization. Endobronchial lesion seems unlikely given that the initial CT at the beginning of the hospitalization showed no atelectasis.   Baseline oxygen sat today on RA at rest 96% and HR 98. After walking 20 feet pt sat was 97% and HR 108, significant dyspnea, unsteady gait, needed assistance when walking. Took a few minutes for breathing to recover.   Medication:   Outpatient Encounter Prescriptions as of 11/12/2015  Medication Sig  . amLODipine (NORVASC) 5 MG tablet TAKE ONE (1) TABLET BY MOUTH EVERY DAY FOR BLOOD PRESSURE  . aspirin 81 MG tablet Take 81 mg by mouth daily.  . chlorpheniramine-HYDROcodone (TUSSIONEX) 10-8 MG/5ML SUER Take 5 mLs by mouth every 12 (twelve) hours.  . cloNIDine (CATAPRES - DOSED  IN MG/24 HR) 0.3 mg/24hr patch Place 0.3 mg onto the skin once a week.   . clopidogrel (PLAVIX) 75 MG tablet Take 1 tablet (75 mg total) by mouth daily.  . cyanocobalamin (,VITAMIN B-12,) 1000 MCG/ML injection   . doxazosin (CARDURA) 8 MG tablet Take 1  tablet (8 mg total) by mouth daily.  . fluticasone (FLONASE) 50 MCG/ACT nasal spray Place into the nose.  . furosemide (LASIX) 20 MG tablet Take 1 tablet (20 mg total) by mouth 2 (two) times daily as needed.  Marland Kitchen glimepiride (AMARYL) 4 MG tablet Take 4 mg by mouth daily before breakfast.  . guaiFENesin-dextromethorphan (ROBITUSSIN DM) 100-10 MG/5ML syrup Take 5 mLs by mouth every 4 (four) hours as needed for cough.  Marland Kitchen ipratropium-albuterol (DUONEB) 0.5-2.5 (3) MG/3ML SOLN Take 3 mLs by nebulization every 4 (four) hours as needed (shortness of breath/wheezing.).  Marland Kitchen levothyroxine (SYNTHROID, LEVOTHROID) 125 MCG tablet Take 125 mcg by mouth daily.  Marland Kitchen losartan-hydrochlorothiazide (HYZAAR) 100-25 MG per tablet Take 1 tablet by mouth daily.    . metFORMIN (GLUCOPHAGE) 1000 MG tablet Take 1,000 mg by mouth 2 (two) times daily with a meal.  . Multiple Vitamins-Minerals (PRESERVISION AREDS PO) Take by mouth 2 (two) times daily.  Marland Kitchen oxybutynin (DITROPAN) 5 MG tablet Take 5 mg by mouth 1 dose over 46 hours.    . pantoprazole (PROTONIX) 40 MG tablet Take 40 mg by mouth daily.   . pioglitazone (ACTOS) 15 MG tablet Take 15 mg by mouth daily.  . predniSONE (DELTASONE) 10 MG tablet Label  & dispense according to the schedule below. 5 Pills PO for 2 days then, 4 Pills PO for 2 days, 3 Pills PO for 2 days, 2 Pills PO for 2 days, 1 Pill PO for 2 days then STOP.  Marland Kitchen senna (SENOKOT) 8.6 MG TABS tablet Take 1 tablet (8.6 mg total) by mouth daily as needed for mild constipation.  . simvastatin (ZOCOR) 40 MG tablet Take 1 tablet (40 mg total) by mouth at bedtime. (Patient taking differently: Take 40 mg by mouth daily. )  . SYMBICORT 160-4.5 MCG/ACT inhaler Inhale 2 puffs into the lungs 2 (two) times daily.    No facility-administered encounter medications on file as of 11/12/2015.      Allergies:  Atenolol and Codeine  Review of Systems: Gen:  Denies  fever, sweats. HEENT: Denies blurred vision. Cvc:  No dizziness,  chest pain or heaviness Resp:   Denies cough or sputum porduction. Gi: Denies swallowing difficulty, stomach pain. constipation, bowel incontinence Gu:  Denies bladder incontinence, burning urine Ext:   No Joint pain, stiffness. Skin: No skin rash, easy bruising. Endoc:  No polyuria, polydipsia. Psych: No depression, insomnia. Other:  All other systems were reviewed and found to be negative other than what is mentioned in the HPI.   Physical Examination:   VS: BP 132/80   Pulse 100   Ht '5\' 2"'$  (1.575 m)   Wt 243 lb (110.2 kg)   SpO2 99%   BMI 44.45 kg/m   General Appearance: No distress  Neuro:without focal findings,  speech normal,  HEENT: PERRLA, EOM intact. Pulmonary: normal breath sounds, No wheezing.   CardiovascularNormal S1,S2.  No m/r/g.   Abdomen: Benign, Soft, non-tender. Renal:  No costovertebral tenderness  GU:  Not performed at this time. Endoc: No evident thyromegaly, no signs of acromegaly. Skin:   warm, no rash. Extremities: normal, no cyanosis, clubbing.   LABORATORY PANEL:   CBC No results for input(s): WBC,  HGB, HCT, PLT in the last 168 hours. ------------------------------------------------------------------------------------------------------------------  Chemistries  No results for input(s): NA, K, CL, CO2, GLUCOSE, BUN, CREATININE, CALCIUM, MG, AST, ALT, ALKPHOS, BILITOT in the last 168 hours.  Invalid input(s): GFRCGP ------------------------------------------------------------------------------------------------------------------  Cardiac Enzymes No results for input(s): TROPONINI in the last 168 hours. ------------------------------------------------------------  RADIOLOGY:   No results found for this or any previous visit. Results for orders placed during the hospital encounter of 12/28/14  DG Chest 2 View   Narrative CLINICAL DATA:  Hemoptysis this morning ; shortness of breath on exertion, history of asthma, chronic CHF, obesity, and  diabetes.  EXAM: CHEST  2 VIEW  COMPARISON:  Portable chest x-ray of October 11, 2013  FINDINGS: The lungs are well-expanded. There is no focal infiltrate. The interstitial markings are coarse. The heart is top-normal in size. The pulmonary vascularity is not engorged. There is no pleural effusion. There is a moderate-sized hiatal hernia. There is apical pleural thickening bilaterally. There is mild degenerative disc space narrowing of the mid thoracic spine.  IMPRESSION: Acute on chronic bronchitic change.  There is no evidence of CHF.   Electronically Signed   By: David  Martinique M.D.   On: 12/28/2014 13:21    ------------------------------------------------------------------------------------------------------------------  Thank  you for allowing [facility] to assist in the care of your patient. Our recommendations are noted above.  Please contact us if we can be of further service.   Marda Stalker, MD.  [facility] Office Number: [phone]  Patricia Pesa, M.D.  Vilinda Boehringer, M.D.  Merton Border, M.D  11/09/2015

## 2015-11-12 ENCOUNTER — Encounter: Payer: Self-pay | Admitting: Internal Medicine

## 2015-11-12 ENCOUNTER — Ambulatory Visit (INDEPENDENT_AMBULATORY_CARE_PROVIDER_SITE_OTHER): Payer: Medicare Other | Admitting: Internal Medicine

## 2015-11-12 VITALS — BP 132/80 | HR 100 | Ht 62.0 in | Wt 243.0 lb

## 2015-11-12 DIAGNOSIS — J9811 Atelectasis: Secondary | ICD-10-CM

## 2015-11-12 DIAGNOSIS — I6523 Occlusion and stenosis of bilateral carotid arteries: Secondary | ICD-10-CM | POA: Diagnosis not present

## 2015-11-12 DIAGNOSIS — Z23 Encounter for immunization: Secondary | ICD-10-CM | POA: Diagnosis not present

## 2015-11-12 DIAGNOSIS — J9610 Chronic respiratory failure, unspecified whether with hypoxia or hypercapnia: Secondary | ICD-10-CM

## 2015-11-12 MED ORDER — PNEUMOCOCCAL 13-VAL CONJ VACC IM SUSP
0.5000 mL | INTRAMUSCULAR | Status: AC
  Administered 2015-11-12: 0.5 mL via INTRAMUSCULAR

## 2015-11-12 MED ORDER — FAMOTIDINE 20 MG PO TABS
20.0000 mg | ORAL_TABLET | Freq: Two times a day (BID) | ORAL | 1 refills | Status: DC
Start: 2015-11-12 — End: 2015-11-14

## 2015-11-12 NOTE — Addendum Note (Signed)
Addended by: Oscar La R on: 11/12/2015 12:24 PM   Modules accepted: Orders

## 2015-11-12 NOTE — Patient Instructions (Addendum)
--  Famotidine, 20 mg twice daily.   --continue symbicort, rinse mouth after each use.   --Flu shot today.

## 2015-11-12 NOTE — Addendum Note (Signed)
Addended by: Oscar La R on: 11/12/2015 12:08 PM   Modules accepted: Orders

## 2015-11-13 ENCOUNTER — Other Ambulatory Visit: Payer: Self-pay | Admitting: *Deleted

## 2015-11-13 ENCOUNTER — Telehealth: Payer: Self-pay

## 2015-11-13 DIAGNOSIS — I130 Hypertensive heart and chronic kidney disease with heart failure and stage 1 through stage 4 chronic kidney disease, or unspecified chronic kidney disease: Secondary | ICD-10-CM | POA: Diagnosis not present

## 2015-11-13 DIAGNOSIS — I5032 Chronic diastolic (congestive) heart failure: Secondary | ICD-10-CM

## 2015-11-13 DIAGNOSIS — L89152 Pressure ulcer of sacral region, stage 2: Secondary | ICD-10-CM | POA: Diagnosis not present

## 2015-11-13 DIAGNOSIS — N183 Chronic kidney disease, stage 3 (moderate): Secondary | ICD-10-CM | POA: Diagnosis not present

## 2015-11-13 DIAGNOSIS — R531 Weakness: Secondary | ICD-10-CM | POA: Diagnosis not present

## 2015-11-13 DIAGNOSIS — I5033 Acute on chronic diastolic (congestive) heart failure: Secondary | ICD-10-CM | POA: Diagnosis not present

## 2015-11-13 DIAGNOSIS — J449 Chronic obstructive pulmonary disease, unspecified: Secondary | ICD-10-CM | POA: Diagnosis not present

## 2015-11-13 NOTE — Telephone Encounter (Signed)
Pt states she would like a 90 day supply for her new rx.

## 2015-11-14 ENCOUNTER — Ambulatory Visit (INDEPENDENT_AMBULATORY_CARE_PROVIDER_SITE_OTHER): Payer: Medicare Other | Admitting: Cardiovascular Disease

## 2015-11-14 ENCOUNTER — Encounter: Payer: Self-pay | Admitting: Cardiovascular Disease

## 2015-11-14 VITALS — BP 122/46 | HR 98 | Ht 62.0 in | Wt 245.8 lb

## 2015-11-14 DIAGNOSIS — I6523 Occlusion and stenosis of bilateral carotid arteries: Secondary | ICD-10-CM

## 2015-11-14 DIAGNOSIS — I4891 Unspecified atrial fibrillation: Secondary | ICD-10-CM | POA: Diagnosis not present

## 2015-11-14 MED ORDER — FAMOTIDINE 20 MG PO TABS: 20.0000 mg | ORAL_TABLET | Freq: Two times a day (BID) | ORAL | 1 refills | Status: DC

## 2015-11-14 MED ORDER — DOXAZOSIN MESYLATE 8 MG PO TABS: 8.0000 mg | ORAL_TABLET | Freq: Two times a day (BID) | ORAL | 3 refills | Status: DC

## 2015-11-14 NOTE — Progress Notes (Addendum)
Patient ID: Amy [name], female   DOB: 05-29-1927, 82 y.o.   MRN: [id number] Cardiology Office Note  Date:  11/14/2015   ID:  Amy [name], DOB 03-Jun-1927, MRN [id number]  PCP:  Leonel Ramsay, MD   Chief Complaint  Patient presents with  . other    C/o sob and fluid retention. Meds reviewed verbally with pt.    HPI:  Ms. Amy [name] is a pleasant 82 year old woman with a history of coronary artery disease, cardiac catheterization confirming severe stenosis of her proximal LAD with stent placed, problems with incontinence, who now presents for routine follow up of her coronary artery disease Beta blocker has been held in the past secondary to bradycardia.  lost her daughter, previous adjustment disorder  Presenting today in a wheelchair Long history of poor diet In follow-up today, she reports that she had recent hospitalization mid August 2017 Had a COPD exacerbation, required steroids, DuoNeb's, flutter device, antibiotics CT scan showing mucus plugging, postobstructive atelectasis Scheduled for outpatient bronchoscopy, though recently seen by pulmonary, this was felt to be not indicated Completed 3 weeks at rehab, [facility] rehab Now at home 2 x week. She needs to feel weak though making do Son in [place] helps her with her house chores  Weight up 10 pounds in 1 year Left leg swelling, right leg ok, etiology unclear Takes Lasix 3 days per week, HCTZ daily  Chronic baseline shortness of breath Previous iron infusion for iron deficiency anemia  Previous lab work Total chol 148 BMP in 03/2015, creatinine 1.4, creatinine 1.2 in 07/10/2015 HBA1C 6  EKG on today's visit shows normal sinus rhythm with rate 98 bpm, right bundle branch block  Other past medical history reviewed Echo 02/2015 EF 60%, mild AS  [facility] August 2015. She had diarrhea, vomiting, felt to be viral also with acute renal failure that improved with hydration. Creatinine reached 1.86, one  month later creatinine 1.0 as an outpatient Continues to be weak, does not walk much and is deconditioned  Guaiac positive and she had colonoscopy and EGD. She reports Dr. Vira Agar did not find anything Iron transfusions in the past under the direction of Dr. Ma Hillock.   Prior episode of shortness of breath while walking in the hospital to schedule a colonoscopy appointment. sent to the emergency room for further evaluation. Systolic pressure was in the 170 range. workup at that time was essentially normal and she was discharged home.  cardiac catheter performed at [facility] on June 22, 2009 details 90% proximal LAD, 40% mid LAD, 40% diagonal, 60% proximal circumflex followed by 40% circumflex lesion, 30%, 40% and 30% lesion noted in the RCA with distal RCA with 30% and 25% lesions. Mild atheroma in the aorta. Xience 2.75 x 12 mm Drug eluting stent placed.   stress test 01/14/2012 showing no ischemia, Lexiscan  PMH:   has a past medical history of Bladder incontinence; CAD (coronary artery disease); Chronic diastolic (congestive) heart failure (HCC); CKD (chronic kidney disease) stage 3, GFR 30-59 ml/min; Degenerative arthritis of knee; Diabetes mellitus; Hiatal hernia; Hypertension; Iron deficiency; Menopausal symptoms; Morbid obesity ([place]); and Thyroid disease.  PSH:    Past Surgical History:  Procedure Laterality Date  . COLONOSCOPY  2015  . REPLACEMENT TOTAL KNEE BILATERAL    . TOTAL VAGINAL HYSTERECTOMY     ovarian mass, not cancerous  . UPPER GI ENDOSCOPY  2015    Current Outpatient Prescriptions  Medication Sig Dispense Refill  . amLODipine (NORVASC) 5 MG tablet TAKE ONE (1)  TABLET BY MOUTH EVERY DAY FOR BLOOD PRESSURE 90 tablet 3  . aspirin 81 MG tablet Take 81 mg by mouth daily.    . chlorpheniramine-HYDROcodone (TUSSIONEX) 10-8 MG/5ML SUER Take 5 mLs by mouth every 12 (twelve) hours. 140 mL 0  . cloNIDine (CATAPRES - DOSED IN MG/24 HR) 0.3 mg/24hr patch Place 0.3 mg onto the skin  once a week.     . clopidogrel (PLAVIX) 75 MG tablet Take 1 tablet (75 mg total) by mouth daily. 90 tablet 3  . cyanocobalamin (,VITAMIN B-12,) 1000 MCG/ML injection     . doxazosin (CARDURA) 8 MG tablet Take 1 tablet (8 mg total) by mouth daily. 90 tablet 4  . famotidine (PEPCID) 20 MG tablet Take 1 tablet (20 mg total) by mouth 2 (two) times daily. 180 tablet 1  . fluticasone (FLONASE) 50 MCG/ACT nasal spray Place into the nose.    . furosemide (LASIX) 20 MG tablet Take 1 tablet (20 mg total) by mouth 2 (two) times daily as needed. 180 tablet 3  . glimepiride (AMARYL) 4 MG tablet Take 4 mg by mouth daily before breakfast.    . guaiFENesin-dextromethorphan (ROBITUSSIN DM) 100-10 MG/5ML syrup Take 5 mLs by mouth every 4 (four) hours as needed for cough. 118 mL 0  . ipratropium-albuterol (DUONEB) 0.5-2.5 (3) MG/3ML SOLN Take 3 mLs by nebulization every 4 (four) hours as needed (shortness of breath/wheezing.). 360 mL   . levothyroxine (SYNTHROID, LEVOTHROID) 125 MCG tablet Take 125 mcg by mouth daily.    Marland Kitchen losartan-hydrochlorothiazide (HYZAAR) 100-25 MG per tablet Take 1 tablet by mouth daily.      . metFORMIN (GLUCOPHAGE) 1000 MG tablet Take 1,000 mg by mouth 2 (two) times daily with a meal.    . Multiple Vitamins-Minerals (PRESERVISION AREDS PO) Take by mouth 2 (two) times daily.    Marland Kitchen oxybutynin (DITROPAN) 5 MG tablet Take 5 mg by mouth 1 dose over 46 hours.      . pantoprazole (PROTONIX) 40 MG tablet Take 40 mg by mouth daily.     Marland Kitchen senna (SENOKOT) 8.6 MG TABS tablet Take 1 tablet (8.6 mg total) by mouth daily as needed for mild constipation. 120 each 0  . simvastatin (ZOCOR) 40 MG tablet Take 1 tablet (40 mg total) by mouth at bedtime. (Patient taking differently: Take 40 mg by mouth daily. ) 90 tablet 3  . SYMBICORT 160-4.5 MCG/ACT inhaler Inhale 2 puffs into the lungs 2 (two) times daily.      No current facility-administered medications for this visit.      Allergies:   Atenolol;  Codeine; Rofecoxib; and Sulfa antibiotics   Social History:  The patient  reports that she has never smoked. She has never used smokeless tobacco. She reports that she does not drink alcohol or use drugs.   Family History:   family history includes Breast cancer in her mother.    Review of Systems: Review of Systems  Respiratory: Positive for shortness of breath.   Cardiovascular: Positive for leg swelling.  Gastrointestinal: Negative.   Musculoskeletal: Negative.   Neurological: Positive for weakness.  Psychiatric/Behavioral: Negative.   All other systems reviewed and are negative.    PHYSICAL EXAM: VS:  BP (!) 122/46 (BP Location: Right Arm, Patient Position: Sitting, Cuff Size: Large)   Pulse 98   Ht '5\' 2"'$  (1.575 m)   Wt 245 lb 12 oz (111.5 kg)   BMI 44.95 kg/m  , BMI Body mass index is 44.95 kg/m. GEN: Well nourished,  well developed, in no acute distress, morbidly obese, presenting a wheelchair HEENT: normal  Neck: no JVD, carotid bruits, or masses Cardiac: RRR; no murmurs, rubs, or gallops,  1+ pitting edema to below the knee left leg, right leg with minimal if any edema Respiratory:  clear to auscultation bilaterally, normal work of breathing GI: soft, nontender, nondistended, + BS MS: no deformity or atrophy  Skin: warm and dry, no rash Neuro:  Strength and sensation are intact Psych: euthymic mood, full affect    Recent Labs: 10/01/2015: B Natriuretic Peptide 53.0; TSH 5.103 10/22/2015: ALT 17; BUN 43; Creatinine, Ser 1.48; Hemoglobin 9.8; Magnesium 2.1; Platelets 172; Potassium 4.1; Sodium 138    Lipid Panel Lab Results  Component Value Date   CHOL 135 06/25/2010   HDL 35 (L) 06/25/2010   LDLCALC 56 06/25/2010   TRIG 220 (H) 06/25/2010      Wt Readings from Last 3 Encounters:  11/14/15 245 lb 12 oz (111.5 kg)  11/12/15 243 lb (110.2 kg)  10/30/15 243 lb 11.2 oz (110.5 kg)       ASSESSMENT AND PLAN:  Essential hypertension -  Blood pressure  Borderline elevated, Given leg swelling on the left, we will hold her amlodipine, increase Cardura to 8 mg twice a day  on losartan, HCTZ, clonidine Other options for blood pressure  include hydralazine, isosorbide  CAD in native artery -  Currently with no symptoms of angina.  No further workup at this time. Continue current medication regimen.  Chronic diastolic CHF (congestive heart failure) (HCC) -  Fluid status difficult to determine, will not advance her diuretics given underlying renal dysfunction, creatinine 1.4 up to 1.5. She denies significant shortness of breath, weight relatively stable Pitting edema left leg, possibly from amlodipine, or venous insufficiency  Recommended she take extra Lasix for any shortness of breath   Angina pectoris associated with type 2 diabetes mellitus ([place]) - Plan: AMB referral to cardiac rehabilitation Denies any chest pain concerning for angina  Uncontrolled type 2 diabetes mellitus with other circulatory complication ([place]) Managed by primary care  Morbid obesity, unspecified obesity type ([place]) Long-standing history of obesity likely contributing to her general deconditioning, inability to exert herself. Starting to have trouble at home, requiring assistance from family  Shortness of breath Likely multifactorial including morbid obesity, deconditioning,  chronic diastolic CHF   Total encounter time more than 25 minutes  Greater than 50% was spent in counseling and coordination of care with the patient   Disposition:   F/U  6 months   Orders Placed This Encounter  Procedures  . EKG 12-Lead     Signed, Esmond Plants, M.D., Ph.D. 11/14/2015  [place], [place]

## 2015-11-14 NOTE — Telephone Encounter (Signed)
Pt requesting 90 day supply for pepcid, pt aware that this is an OTC medication and insurance may not cover it. Pt states she would like the rx sent to paharmacy just in case insurance will cover. rx sent to preferred pharmacy. Pt aware & voiced understanding. Nothing further needed.

## 2015-11-14 NOTE — Patient Instructions (Signed)
Medication Instructions:   Stop the amlodipine Increase the cardura/doxazosin up to one pill twice a day  Labwork:  No new labs needed  Testing/Procedures:  No further testing at this time   Follow-Up: It was a pleasure seeing you in the office today. Please call us if you have new issues that need to be addressed before your next appt.  [phone]  Your physician wants you to follow-up in: 6 months.  You will receive a reminder letter in the mail two months in advance. If you don't receive a letter, please call our office to schedule the follow-up appointment.  If you need a refill on your cardiac medications before your next appointment, please call your pharmacy.

## 2015-11-15 DIAGNOSIS — R531 Weakness: Secondary | ICD-10-CM | POA: Diagnosis not present

## 2015-11-15 DIAGNOSIS — I130 Hypertensive heart and chronic kidney disease with heart failure and stage 1 through stage 4 chronic kidney disease, or unspecified chronic kidney disease: Secondary | ICD-10-CM | POA: Diagnosis not present

## 2015-11-15 DIAGNOSIS — I5033 Acute on chronic diastolic (congestive) heart failure: Secondary | ICD-10-CM | POA: Diagnosis not present

## 2015-11-15 DIAGNOSIS — N183 Chronic kidney disease, stage 3 (moderate): Secondary | ICD-10-CM | POA: Diagnosis not present

## 2015-11-15 DIAGNOSIS — J449 Chronic obstructive pulmonary disease, unspecified: Secondary | ICD-10-CM | POA: Diagnosis not present

## 2015-11-15 DIAGNOSIS — L89152 Pressure ulcer of sacral region, stage 2: Secondary | ICD-10-CM | POA: Diagnosis not present

## 2015-11-16 DIAGNOSIS — N183 Chronic kidney disease, stage 3 (moderate): Secondary | ICD-10-CM | POA: Diagnosis not present

## 2015-11-16 DIAGNOSIS — L89152 Pressure ulcer of sacral region, stage 2: Secondary | ICD-10-CM | POA: Diagnosis not present

## 2015-11-16 DIAGNOSIS — I130 Hypertensive heart and chronic kidney disease with heart failure and stage 1 through stage 4 chronic kidney disease, or unspecified chronic kidney disease: Secondary | ICD-10-CM | POA: Diagnosis not present

## 2015-11-16 DIAGNOSIS — J449 Chronic obstructive pulmonary disease, unspecified: Secondary | ICD-10-CM | POA: Diagnosis not present

## 2015-11-16 DIAGNOSIS — R531 Weakness: Secondary | ICD-10-CM | POA: Diagnosis not present

## 2015-11-16 DIAGNOSIS — I5033 Acute on chronic diastolic (congestive) heart failure: Secondary | ICD-10-CM | POA: Diagnosis not present

## 2015-11-19 DIAGNOSIS — I5033 Acute on chronic diastolic (congestive) heart failure: Secondary | ICD-10-CM | POA: Diagnosis not present

## 2015-11-19 DIAGNOSIS — N183 Chronic kidney disease, stage 3 (moderate): Secondary | ICD-10-CM | POA: Diagnosis not present

## 2015-11-19 DIAGNOSIS — J449 Chronic obstructive pulmonary disease, unspecified: Secondary | ICD-10-CM | POA: Diagnosis not present

## 2015-11-19 DIAGNOSIS — I130 Hypertensive heart and chronic kidney disease with heart failure and stage 1 through stage 4 chronic kidney disease, or unspecified chronic kidney disease: Secondary | ICD-10-CM | POA: Diagnosis not present

## 2015-11-19 DIAGNOSIS — R531 Weakness: Secondary | ICD-10-CM | POA: Diagnosis not present

## 2015-11-19 DIAGNOSIS — L89152 Pressure ulcer of sacral region, stage 2: Secondary | ICD-10-CM | POA: Diagnosis not present

## 2015-11-20 DIAGNOSIS — N183 Chronic kidney disease, stage 3 (moderate): Secondary | ICD-10-CM | POA: Diagnosis not present

## 2015-11-20 DIAGNOSIS — I130 Hypertensive heart and chronic kidney disease with heart failure and stage 1 through stage 4 chronic kidney disease, or unspecified chronic kidney disease: Secondary | ICD-10-CM | POA: Diagnosis not present

## 2015-11-20 DIAGNOSIS — L89152 Pressure ulcer of sacral region, stage 2: Secondary | ICD-10-CM | POA: Diagnosis not present

## 2015-11-20 DIAGNOSIS — I5033 Acute on chronic diastolic (congestive) heart failure: Secondary | ICD-10-CM | POA: Diagnosis not present

## 2015-11-20 DIAGNOSIS — J449 Chronic obstructive pulmonary disease, unspecified: Secondary | ICD-10-CM | POA: Diagnosis not present

## 2015-11-20 DIAGNOSIS — R531 Weakness: Secondary | ICD-10-CM | POA: Diagnosis not present

## 2015-11-21 DIAGNOSIS — N183 Chronic kidney disease, stage 3 (moderate): Secondary | ICD-10-CM | POA: Diagnosis not present

## 2015-11-21 DIAGNOSIS — R531 Weakness: Secondary | ICD-10-CM | POA: Diagnosis not present

## 2015-11-21 DIAGNOSIS — I130 Hypertensive heart and chronic kidney disease with heart failure and stage 1 through stage 4 chronic kidney disease, or unspecified chronic kidney disease: Secondary | ICD-10-CM | POA: Diagnosis not present

## 2015-11-21 DIAGNOSIS — I5033 Acute on chronic diastolic (congestive) heart failure: Secondary | ICD-10-CM | POA: Diagnosis not present

## 2015-11-21 DIAGNOSIS — J449 Chronic obstructive pulmonary disease, unspecified: Secondary | ICD-10-CM | POA: Diagnosis not present

## 2015-11-21 DIAGNOSIS — L89152 Pressure ulcer of sacral region, stage 2: Secondary | ICD-10-CM | POA: Diagnosis not present

## 2015-11-22 DIAGNOSIS — N183 Chronic kidney disease, stage 3 (moderate): Secondary | ICD-10-CM | POA: Diagnosis not present

## 2015-11-22 DIAGNOSIS — I5033 Acute on chronic diastolic (congestive) heart failure: Secondary | ICD-10-CM | POA: Diagnosis not present

## 2015-11-22 DIAGNOSIS — L89152 Pressure ulcer of sacral region, stage 2: Secondary | ICD-10-CM | POA: Diagnosis not present

## 2015-11-22 DIAGNOSIS — I130 Hypertensive heart and chronic kidney disease with heart failure and stage 1 through stage 4 chronic kidney disease, or unspecified chronic kidney disease: Secondary | ICD-10-CM | POA: Diagnosis not present

## 2015-11-22 DIAGNOSIS — R531 Weakness: Secondary | ICD-10-CM | POA: Diagnosis not present

## 2015-11-22 DIAGNOSIS — J449 Chronic obstructive pulmonary disease, unspecified: Secondary | ICD-10-CM | POA: Diagnosis not present

## 2015-11-25 NOTE — Progress Notes (Signed)
[place]  [phone] [phone]  ID: Amy [name] OB: 01/18/1928  MR#: [id number]  RDE#:[id number]  Patient Care Team: Leonel Ramsay, MD as PCP - General (Infectious Diseases) Minna Merritts, MD as Consulting Physician (Cardiology)  CHIEF COMPLAINT: Iron deficiency anemia secondary to chronic blood loss.  INTERVAL HISTORY: Patient returns to clinic for repeat laboratory work and further evaluation. She continues to have complaints of increased shortness of breath, weakness and fatigue. She has no neurologic complaints. She denies any recent fevers or illnesses. She denies any chest pain. She denies any nausea, vomiting, constipation, or diarrhea. She has no urinary complaints. Patient offers no further specific complaints today.  REVIEW OF SYSTEMS:   Review of Systems  Constitutional: Positive for malaise/fatigue. Negative for fever and weight loss.  HENT: Negative.   Respiratory: Positive for shortness of breath. Negative for cough and hemoptysis.   Cardiovascular: Negative.  Negative for chest pain and leg swelling.  Gastrointestinal: Negative.  Negative for abdominal pain, blood in stool and melena.  Genitourinary: Negative.  Negative for hematuria.  Musculoskeletal: Negative.   Neurological: Positive for weakness.  Psychiatric/Behavioral: Negative.  The patient is not nervous/anxious.     As per HPI. Otherwise, a complete review of systems is negative.  PAST MEDICAL HISTORY: Past Medical History:  Diagnosis Date  . Bladder incontinence   . CAD (coronary artery disease)    a. cardiac cath 05/2009: proximal LAD 90% stenosis s/p PCI/Xience 2.75 x 12 mm DES, mid LAD 40%, diagonal 40%, proximal LCx 40% followed by 40% LCx lesion, 30%-40%-30% lesion noted in the RCA with distal RCA 30% and 25% lesions  . Chronic diastolic (congestive) heart failure ([place])    a. Lexiscan 2013: no ischemia, normal EF b. echo 09/2015: EF 55-60% w/  Grade 1 DD  . CKD (chronic kidney disease) stage 3, GFR 30-59 ml/min   . Degenerative arthritis of knee    bilateral knees  . Diabetes mellitus    Type II  . Hiatal hernia   . Hypertension   . Iron deficiency   . Menopausal symptoms   . Morbid obesity ([place])   . Thyroid disease    hypothyroidism    PAST SURGICAL HISTORY: Past Surgical History:  Procedure Laterality Date  . COLONOSCOPY  2015  . REPLACEMENT TOTAL KNEE BILATERAL    . TOTAL VAGINAL HYSTERECTOMY     ovarian mass, not cancerous  . UPPER GI ENDOSCOPY  2015    FAMILY HISTORY Family History  Problem Relation Age of Onset  . Breast cancer Mother        ADVANCED DIRECTIVES:    HEALTH MAINTENANCE: Social History  Substance Use Topics  . Smoking status: Never Smoker  . Smokeless tobacco: Never Used  . Alcohol use No     Colonoscopy:  PAP:  Bone density:  Lipid panel:  Allergies  Allergen Reactions  . Atenolol     Other reaction(s): Other (See Comments) Decreased heart rate  . Codeine     Rash, difficulty breathing, nausea.  . Rofecoxib     Other reaction(s): Unknown  . Sulfa Antibiotics     Other reaction(s): Unknown    Current Outpatient Prescriptions  Medication Sig Dispense Refill  . aspirin 81 MG tablet Take 81 mg by mouth daily.    . chlorpheniramine-HYDROcodone (TUSSIONEX) 10-8 MG/5ML SUER Take 5 mLs by mouth every 12 (twelve) hours. 140 mL 0  . cloNIDine (CATAPRES - DOSED IN MG/24 HR) 0.3 mg/24hr patch Place  0.3 mg onto the skin once a week.     . clopidogrel (PLAVIX) 75 MG tablet Take 1 tablet (75 mg total) by mouth daily. 90 tablet 3  . cyanocobalamin (,VITAMIN B-12,) 1000 MCG/ML injection     . doxazosin (CARDURA) 8 MG tablet Take 1 tablet (8 mg total) by mouth 2 (two) times daily. 180 tablet 3  . famotidine (PEPCID) 20 MG tablet Take 1 tablet (20 mg total) by mouth 2 (two) times daily. 180 tablet 1  . fluticasone (FLONASE) 50 MCG/ACT nasal spray Place into the nose.    . furosemide  (LASIX) 20 MG tablet Take 1 tablet (20 mg total) by mouth 2 (two) times daily as needed. 180 tablet 3  . glimepiride (AMARYL) 4 MG tablet Take 4 mg by mouth daily before breakfast.    . guaiFENesin-dextromethorphan (ROBITUSSIN DM) 100-10 MG/5ML syrup Take 5 mLs by mouth every 4 (four) hours as needed for cough. 118 mL 0  . ipratropium-albuterol (DUONEB) 0.5-2.5 (3) MG/3ML SOLN Take 3 mLs by nebulization every 4 (four) hours as needed (shortness of breath/wheezing.). 360 mL   . levothyroxine (SYNTHROID, LEVOTHROID) 125 MCG tablet Take 125 mcg by mouth daily.    Marland Kitchen losartan-hydrochlorothiazide (HYZAAR) 100-25 MG per tablet Take 1 tablet by mouth daily.      . metFORMIN (GLUCOPHAGE) 1000 MG tablet Take 1,000 mg by mouth 2 (two) times daily with a meal.    . Multiple Vitamins-Minerals (PRESERVISION AREDS PO) Take by mouth 2 (two) times daily.    Marland Kitchen oxybutynin (DITROPAN) 5 MG tablet Take 5 mg by mouth 1 dose over 46 hours.      . pantoprazole (PROTONIX) 40 MG tablet Take 40 mg by mouth daily.     Marland Kitchen senna (SENOKOT) 8.6 MG TABS tablet Take 1 tablet (8.6 mg total) by mouth daily as needed for mild constipation. 120 each 0  . simvastatin (ZOCOR) 40 MG tablet Take 1 tablet (40 mg total) by mouth at bedtime. (Patient taking differently: Take 40 mg by mouth daily. ) 90 tablet 3  . SYMBICORT 160-4.5 MCG/ACT inhaler Inhale 2 puffs into the lungs 2 (two) times daily.      No current facility-administered medications for this visit.     OBJECTIVE: Vitals:   11/26/15 0942  BP: (!) 146/63  Pulse: 88  Resp: 18  Temp: 97.7 F (36.5 C)     Body mass index is 44.9 kg/m.    ECOG FS:2 - Symptomatic, <50% confined to bed  General: Well-developed, well-nourished, no acute distress. Eyes: Pink conjunctiva, anicteric sclera. Lungs: Scattered wheezing. Heart: Regular rate and rhythm. No rubs, murmurs, or gallops. Abdomen: Soft, nontender, nondistended. No organomegaly noted, normoactive bowel  sounds. Musculoskeletal: No edema, cyanosis, or clubbing. Neuro: Alert, answering all questions appropriately. Cranial nerves grossly intact. Skin: No rashes or petechiae noted. Psych: Normal affect.  LAB RESULTS:  Lab Results  Component Value Date   NA 138 10/22/2015   K 4.1 10/22/2015   CL 102 10/22/2015   CO2 26 10/22/2015   GLUCOSE 177 (H) 10/22/2015   BUN 43 (H) 10/22/2015   CREATININE 1.48 (H) 10/22/2015   CALCIUM 8.9 10/22/2015   PROT 5.4 (L) 10/22/2015   ALBUMIN 3.5 10/22/2015   AST 15 10/22/2015   ALT 17 10/22/2015   ALKPHOS 60 10/22/2015   BILITOT 0.5 10/22/2015   GFRNONAA 31 (L) 10/22/2015   GFRAA 36 (L) 10/22/2015    Lab Results  Component Value Date   WBC 7.2 11/26/2015  NEUTROABS 4.5 11/26/2015   HGB 8.6 (L) 11/26/2015   HCT 25.9 (L) 11/26/2015   MCV 91.1 11/26/2015   PLT 272 11/26/2015   Lab Results  Component Value Date   IRON 22 (L) 11/26/2015   TIBC 306 11/26/2015   IRONPCTSAT 7 (L) 11/26/2015    Lab Results  Component Value Date   FERRITIN 17 11/26/2015    STUDIES: No results found.  ASSESSMENT:  Iron deficiency anemia secondary to chronic blood loss, shortness of breath.   PLAN:    1. Iron deficiency anemia secondary to chronic blood loss: Previously, patient was reported to be intolerant of oral iron supplementation. She also had an EGD and colonoscopy in May 2015 for heme positive stools but only revealed esophagitis and tubular adenomas in her colon. Patient's hemoglobin and iron stores continue to be decreased. Proceed with 510 mg IV Feraheme and then return next week for second infusion. Return to In 2 months with repeat laboratory work and further evaluation. 2. Shortness of breath:  Multifactorial. IV iron as above and continued evaluation and treatment per cardiology. 3. Renal insufficiency: Mild, monitor.  Patient expressed understanding and was in agreement with this plan. She also understands that She can call clinic at any  time with any questions, concerns, or complaints.    Lloyd Huger, MD   11/27/2015 10:01 AM

## 2015-11-26 ENCOUNTER — Ambulatory Visit: Payer: Medicare Other

## 2015-11-26 ENCOUNTER — Inpatient Hospital Stay: Payer: Medicare Other

## 2015-11-26 ENCOUNTER — Inpatient Hospital Stay: Payer: Medicare Other | Attending: Oncology

## 2015-11-26 ENCOUNTER — Ambulatory Visit: Payer: Medicare Other | Admitting: Oncology

## 2015-11-26 ENCOUNTER — Other Ambulatory Visit: Payer: Medicare Other

## 2015-11-26 ENCOUNTER — Inpatient Hospital Stay (HOSPITAL_BASED_OUTPATIENT_CLINIC_OR_DEPARTMENT_OTHER): Payer: Medicare Other | Admitting: Oncology

## 2015-11-26 VITALS — BP 146/63 | HR 88 | Temp 97.7°F | Resp 18 | Wt 245.5 lb

## 2015-11-26 DIAGNOSIS — M129 Arthropathy, unspecified: Secondary | ICD-10-CM | POA: Diagnosis not present

## 2015-11-26 DIAGNOSIS — R531 Weakness: Secondary | ICD-10-CM | POA: Insufficient documentation

## 2015-11-26 DIAGNOSIS — R0602 Shortness of breath: Secondary | ICD-10-CM | POA: Diagnosis not present

## 2015-11-26 DIAGNOSIS — Z7982 Long term (current) use of aspirin: Secondary | ICD-10-CM

## 2015-11-26 DIAGNOSIS — Z79899 Other long term (current) drug therapy: Secondary | ICD-10-CM

## 2015-11-26 DIAGNOSIS — K449 Diaphragmatic hernia without obstruction or gangrene: Secondary | ICD-10-CM | POA: Insufficient documentation

## 2015-11-26 DIAGNOSIS — I129 Hypertensive chronic kidney disease with stage 1 through stage 4 chronic kidney disease, or unspecified chronic kidney disease: Secondary | ICD-10-CM

## 2015-11-26 DIAGNOSIS — I2510 Atherosclerotic heart disease of native coronary artery without angina pectoris: Secondary | ICD-10-CM | POA: Diagnosis not present

## 2015-11-26 DIAGNOSIS — Z7984 Long term (current) use of oral hypoglycemic drugs: Secondary | ICD-10-CM | POA: Insufficient documentation

## 2015-11-26 DIAGNOSIS — Z803 Family history of malignant neoplasm of breast: Secondary | ICD-10-CM | POA: Insufficient documentation

## 2015-11-26 DIAGNOSIS — N183 Chronic kidney disease, stage 3 (moderate): Secondary | ICD-10-CM | POA: Diagnosis not present

## 2015-11-26 DIAGNOSIS — E039 Hypothyroidism, unspecified: Secondary | ICD-10-CM | POA: Diagnosis not present

## 2015-11-26 DIAGNOSIS — E119 Type 2 diabetes mellitus without complications: Secondary | ICD-10-CM | POA: Diagnosis not present

## 2015-11-26 DIAGNOSIS — I5032 Chronic diastolic (congestive) heart failure: Secondary | ICD-10-CM | POA: Diagnosis not present

## 2015-11-26 DIAGNOSIS — K209 Esophagitis, unspecified: Secondary | ICD-10-CM | POA: Insufficient documentation

## 2015-11-26 DIAGNOSIS — R5383 Other fatigue: Secondary | ICD-10-CM | POA: Insufficient documentation

## 2015-11-26 DIAGNOSIS — D500 Iron deficiency anemia secondary to blood loss (chronic): Secondary | ICD-10-CM | POA: Diagnosis not present

## 2015-11-26 DIAGNOSIS — D126 Benign neoplasm of colon, unspecified: Secondary | ICD-10-CM | POA: Diagnosis not present

## 2015-11-26 DIAGNOSIS — E669 Obesity, unspecified: Secondary | ICD-10-CM | POA: Diagnosis not present

## 2015-11-26 DIAGNOSIS — D509 Iron deficiency anemia, unspecified: Secondary | ICD-10-CM

## 2015-11-26 LAB — CBC WITH DIFFERENTIAL/PLATELET
BASOS ABS: 0 10*3/uL (ref 0–0.1)
Basophils Relative: 0 %
EOS ABS: 0.1 10*3/uL (ref 0–0.7)
EOS PCT: 1 %
HCT: 25.9 % — ABNORMAL LOW (ref 35.0–47.0)
Hemoglobin: 8.6 g/dL — ABNORMAL LOW (ref 12.0–16.0)
Lymphocytes Relative: 26 %
Lymphs Abs: 1.9 10*3/uL (ref 1.0–3.6)
MCH: 30.1 pg (ref 26.0–34.0)
MCHC: 33 g/dL (ref 32.0–36.0)
MCV: 91.1 fL (ref 80.0–100.0)
Monocytes Absolute: 0.7 10*3/uL (ref 0.2–0.9)
Monocytes Relative: 10 %
Neutro Abs: 4.5 10*3/uL (ref 1.4–6.5)
Neutrophils Relative %: 63 %
PLATELETS: 272 10*3/uL (ref 150–440)
RBC: 2.84 MIL/uL — AB (ref 3.80–5.20)
RDW: 16.3 % — AB (ref 11.5–14.5)
WBC: 7.2 10*3/uL (ref 3.6–11.0)

## 2015-11-26 LAB — IRON AND TIBC
IRON: 22 ug/dL — AB (ref 28–170)
SATURATION RATIOS: 7 % — AB (ref 10.4–31.8)
TIBC: 306 ug/dL (ref 250–450)
UIBC: 284 ug/dL

## 2015-11-26 LAB — FERRITIN: Ferritin: 17 ng/mL (ref 11–307)

## 2015-11-26 MED ORDER — SODIUM CHLORIDE 0.9 % IV SOLN
510.0000 mg | Freq: Once | INTRAVENOUS | Status: AC
  Administered 2015-11-26: 510 mg via INTRAVENOUS
  Filled 2015-11-26: qty 17

## 2015-11-26 MED ORDER — SODIUM CHLORIDE 0.9 % IV SOLN
Freq: Once | INTRAVENOUS | Status: AC
  Administered 2015-11-26: 11:00:00 via INTRAVENOUS
  Filled 2015-11-26: qty 1000

## 2015-11-26 NOTE — Progress Notes (Signed)
States is having shortness of breath with exertion.

## 2015-11-27 DIAGNOSIS — J449 Chronic obstructive pulmonary disease, unspecified: Secondary | ICD-10-CM | POA: Diagnosis not present

## 2015-11-27 DIAGNOSIS — L89152 Pressure ulcer of sacral region, stage 2: Secondary | ICD-10-CM | POA: Diagnosis not present

## 2015-11-27 DIAGNOSIS — I130 Hypertensive heart and chronic kidney disease with heart failure and stage 1 through stage 4 chronic kidney disease, or unspecified chronic kidney disease: Secondary | ICD-10-CM | POA: Diagnosis not present

## 2015-11-27 DIAGNOSIS — N183 Chronic kidney disease, stage 3 (moderate): Secondary | ICD-10-CM | POA: Diagnosis not present

## 2015-11-27 DIAGNOSIS — R531 Weakness: Secondary | ICD-10-CM | POA: Diagnosis not present

## 2015-11-27 DIAGNOSIS — I5033 Acute on chronic diastolic (congestive) heart failure: Secondary | ICD-10-CM | POA: Diagnosis not present

## 2015-11-28 DIAGNOSIS — R531 Weakness: Secondary | ICD-10-CM | POA: Diagnosis not present

## 2015-11-28 DIAGNOSIS — J449 Chronic obstructive pulmonary disease, unspecified: Secondary | ICD-10-CM | POA: Diagnosis not present

## 2015-11-28 DIAGNOSIS — L89152 Pressure ulcer of sacral region, stage 2: Secondary | ICD-10-CM | POA: Diagnosis not present

## 2015-11-28 DIAGNOSIS — I5033 Acute on chronic diastolic (congestive) heart failure: Secondary | ICD-10-CM | POA: Diagnosis not present

## 2015-11-28 DIAGNOSIS — I130 Hypertensive heart and chronic kidney disease with heart failure and stage 1 through stage 4 chronic kidney disease, or unspecified chronic kidney disease: Secondary | ICD-10-CM | POA: Diagnosis not present

## 2015-11-28 DIAGNOSIS — N183 Chronic kidney disease, stage 3 (moderate): Secondary | ICD-10-CM | POA: Diagnosis not present

## 2015-11-29 DIAGNOSIS — L89152 Pressure ulcer of sacral region, stage 2: Secondary | ICD-10-CM | POA: Diagnosis not present

## 2015-11-29 DIAGNOSIS — R531 Weakness: Secondary | ICD-10-CM | POA: Diagnosis not present

## 2015-11-29 DIAGNOSIS — I5033 Acute on chronic diastolic (congestive) heart failure: Secondary | ICD-10-CM | POA: Diagnosis not present

## 2015-11-29 DIAGNOSIS — J449 Chronic obstructive pulmonary disease, unspecified: Secondary | ICD-10-CM | POA: Diagnosis not present

## 2015-11-29 DIAGNOSIS — I130 Hypertensive heart and chronic kidney disease with heart failure and stage 1 through stage 4 chronic kidney disease, or unspecified chronic kidney disease: Secondary | ICD-10-CM | POA: Diagnosis not present

## 2015-11-29 DIAGNOSIS — N183 Chronic kidney disease, stage 3 (moderate): Secondary | ICD-10-CM | POA: Diagnosis not present

## 2015-12-03 ENCOUNTER — Inpatient Hospital Stay: Payer: Medicare Other

## 2015-12-03 DIAGNOSIS — R0602 Shortness of breath: Secondary | ICD-10-CM | POA: Diagnosis not present

## 2015-12-03 DIAGNOSIS — D126 Benign neoplasm of colon, unspecified: Secondary | ICD-10-CM | POA: Diagnosis not present

## 2015-12-03 DIAGNOSIS — D500 Iron deficiency anemia secondary to blood loss (chronic): Secondary | ICD-10-CM

## 2015-12-03 DIAGNOSIS — I129 Hypertensive chronic kidney disease with stage 1 through stage 4 chronic kidney disease, or unspecified chronic kidney disease: Secondary | ICD-10-CM | POA: Diagnosis not present

## 2015-12-03 DIAGNOSIS — Z79899 Other long term (current) drug therapy: Secondary | ICD-10-CM | POA: Diagnosis not present

## 2015-12-03 DIAGNOSIS — K209 Esophagitis, unspecified: Secondary | ICD-10-CM | POA: Diagnosis not present

## 2015-12-03 MED ORDER — SODIUM CHLORIDE 0.9 % IV SOLN
Freq: Once | INTRAVENOUS | Status: AC
  Administered 2015-12-03: 12:00:00 via INTRAVENOUS
  Filled 2015-12-03: qty 1000

## 2015-12-03 MED ORDER — SODIUM CHLORIDE 0.9 % IV SOLN
510.0000 mg | Freq: Once | INTRAVENOUS | Status: AC
  Administered 2015-12-03: 510 mg via INTRAVENOUS
  Filled 2015-12-03: qty 17

## 2015-12-04 ENCOUNTER — Other Ambulatory Visit: Payer: Self-pay | Admitting: Infectious Diseases

## 2015-12-04 DIAGNOSIS — J449 Chronic obstructive pulmonary disease, unspecified: Secondary | ICD-10-CM | POA: Diagnosis not present

## 2015-12-04 DIAGNOSIS — R531 Weakness: Secondary | ICD-10-CM | POA: Diagnosis not present

## 2015-12-04 DIAGNOSIS — Z1231 Encounter for screening mammogram for malignant neoplasm of breast: Secondary | ICD-10-CM

## 2015-12-04 DIAGNOSIS — N183 Chronic kidney disease, stage 3 (moderate): Secondary | ICD-10-CM | POA: Diagnosis not present

## 2015-12-04 DIAGNOSIS — I130 Hypertensive heart and chronic kidney disease with heart failure and stage 1 through stage 4 chronic kidney disease, or unspecified chronic kidney disease: Secondary | ICD-10-CM | POA: Diagnosis not present

## 2015-12-04 DIAGNOSIS — I5033 Acute on chronic diastolic (congestive) heart failure: Secondary | ICD-10-CM | POA: Diagnosis not present

## 2015-12-04 DIAGNOSIS — L89152 Pressure ulcer of sacral region, stage 2: Secondary | ICD-10-CM | POA: Diagnosis not present

## 2015-12-05 DIAGNOSIS — L89152 Pressure ulcer of sacral region, stage 2: Secondary | ICD-10-CM | POA: Diagnosis not present

## 2015-12-05 DIAGNOSIS — J449 Chronic obstructive pulmonary disease, unspecified: Secondary | ICD-10-CM | POA: Diagnosis not present

## 2015-12-05 DIAGNOSIS — I130 Hypertensive heart and chronic kidney disease with heart failure and stage 1 through stage 4 chronic kidney disease, or unspecified chronic kidney disease: Secondary | ICD-10-CM | POA: Diagnosis not present

## 2015-12-05 DIAGNOSIS — R531 Weakness: Secondary | ICD-10-CM | POA: Diagnosis not present

## 2015-12-05 DIAGNOSIS — N183 Chronic kidney disease, stage 3 (moderate): Secondary | ICD-10-CM | POA: Diagnosis not present

## 2015-12-05 DIAGNOSIS — I5033 Acute on chronic diastolic (congestive) heart failure: Secondary | ICD-10-CM | POA: Diagnosis not present

## 2015-12-13 DIAGNOSIS — R531 Weakness: Secondary | ICD-10-CM | POA: Diagnosis not present

## 2015-12-13 DIAGNOSIS — N183 Chronic kidney disease, stage 3 (moderate): Secondary | ICD-10-CM | POA: Diagnosis not present

## 2015-12-13 DIAGNOSIS — L89152 Pressure ulcer of sacral region, stage 2: Secondary | ICD-10-CM | POA: Diagnosis not present

## 2015-12-13 DIAGNOSIS — I130 Hypertensive heart and chronic kidney disease with heart failure and stage 1 through stage 4 chronic kidney disease, or unspecified chronic kidney disease: Secondary | ICD-10-CM | POA: Diagnosis not present

## 2015-12-13 DIAGNOSIS — J449 Chronic obstructive pulmonary disease, unspecified: Secondary | ICD-10-CM | POA: Diagnosis not present

## 2015-12-13 DIAGNOSIS — I5033 Acute on chronic diastolic (congestive) heart failure: Secondary | ICD-10-CM | POA: Diagnosis not present

## 2015-12-14 DIAGNOSIS — I5033 Acute on chronic diastolic (congestive) heart failure: Secondary | ICD-10-CM | POA: Diagnosis not present

## 2015-12-14 DIAGNOSIS — R531 Weakness: Secondary | ICD-10-CM | POA: Diagnosis not present

## 2015-12-14 DIAGNOSIS — N183 Chronic kidney disease, stage 3 (moderate): Secondary | ICD-10-CM | POA: Diagnosis not present

## 2015-12-14 DIAGNOSIS — I130 Hypertensive heart and chronic kidney disease with heart failure and stage 1 through stage 4 chronic kidney disease, or unspecified chronic kidney disease: Secondary | ICD-10-CM | POA: Diagnosis not present

## 2015-12-14 DIAGNOSIS — J449 Chronic obstructive pulmonary disease, unspecified: Secondary | ICD-10-CM | POA: Diagnosis not present

## 2015-12-14 DIAGNOSIS — L89152 Pressure ulcer of sacral region, stage 2: Secondary | ICD-10-CM | POA: Diagnosis not present

## 2015-12-18 DIAGNOSIS — I5033 Acute on chronic diastolic (congestive) heart failure: Secondary | ICD-10-CM | POA: Diagnosis not present

## 2015-12-18 DIAGNOSIS — J449 Chronic obstructive pulmonary disease, unspecified: Secondary | ICD-10-CM | POA: Diagnosis not present

## 2015-12-18 DIAGNOSIS — I130 Hypertensive heart and chronic kidney disease with heart failure and stage 1 through stage 4 chronic kidney disease, or unspecified chronic kidney disease: Secondary | ICD-10-CM | POA: Diagnosis not present

## 2015-12-18 DIAGNOSIS — R531 Weakness: Secondary | ICD-10-CM | POA: Diagnosis not present

## 2015-12-18 DIAGNOSIS — L89152 Pressure ulcer of sacral region, stage 2: Secondary | ICD-10-CM | POA: Diagnosis not present

## 2015-12-18 DIAGNOSIS — N183 Chronic kidney disease, stage 3 (moderate): Secondary | ICD-10-CM | POA: Diagnosis not present

## 2015-12-20 DIAGNOSIS — N183 Chronic kidney disease, stage 3 (moderate): Secondary | ICD-10-CM | POA: Diagnosis not present

## 2015-12-20 DIAGNOSIS — I5033 Acute on chronic diastolic (congestive) heart failure: Secondary | ICD-10-CM | POA: Diagnosis not present

## 2015-12-20 DIAGNOSIS — J449 Chronic obstructive pulmonary disease, unspecified: Secondary | ICD-10-CM | POA: Diagnosis not present

## 2015-12-20 DIAGNOSIS — L89152 Pressure ulcer of sacral region, stage 2: Secondary | ICD-10-CM | POA: Diagnosis not present

## 2015-12-20 DIAGNOSIS — I130 Hypertensive heart and chronic kidney disease with heart failure and stage 1 through stage 4 chronic kidney disease, or unspecified chronic kidney disease: Secondary | ICD-10-CM | POA: Diagnosis not present

## 2015-12-20 DIAGNOSIS — R531 Weakness: Secondary | ICD-10-CM | POA: Diagnosis not present

## 2015-12-24 DIAGNOSIS — J441 Chronic obstructive pulmonary disease with (acute) exacerbation: Secondary | ICD-10-CM | POA: Diagnosis not present

## 2015-12-24 DIAGNOSIS — I10 Essential (primary) hypertension: Secondary | ICD-10-CM | POA: Diagnosis not present

## 2015-12-24 DIAGNOSIS — E7801 Familial hypercholesterolemia: Secondary | ICD-10-CM | POA: Diagnosis not present

## 2015-12-24 DIAGNOSIS — E1165 Type 2 diabetes mellitus with hyperglycemia: Secondary | ICD-10-CM | POA: Diagnosis not present

## 2015-12-24 DIAGNOSIS — E079 Disorder of thyroid, unspecified: Secondary | ICD-10-CM | POA: Diagnosis not present

## 2015-12-24 DIAGNOSIS — E538 Deficiency of other specified B group vitamins: Secondary | ICD-10-CM | POA: Diagnosis not present

## 2015-12-24 DIAGNOSIS — D500 Iron deficiency anemia secondary to blood loss (chronic): Secondary | ICD-10-CM | POA: Diagnosis not present

## 2015-12-24 DIAGNOSIS — R0600 Dyspnea, unspecified: Secondary | ICD-10-CM | POA: Diagnosis not present

## 2015-12-24 DIAGNOSIS — I2510 Atherosclerotic heart disease of native coronary artery without angina pectoris: Secondary | ICD-10-CM | POA: Diagnosis not present

## 2015-12-25 DIAGNOSIS — I5033 Acute on chronic diastolic (congestive) heart failure: Secondary | ICD-10-CM | POA: Diagnosis not present

## 2015-12-25 DIAGNOSIS — R531 Weakness: Secondary | ICD-10-CM | POA: Diagnosis not present

## 2015-12-25 DIAGNOSIS — N183 Chronic kidney disease, stage 3 (moderate): Secondary | ICD-10-CM | POA: Diagnosis not present

## 2015-12-25 DIAGNOSIS — L89152 Pressure ulcer of sacral region, stage 2: Secondary | ICD-10-CM | POA: Diagnosis not present

## 2015-12-25 DIAGNOSIS — J449 Chronic obstructive pulmonary disease, unspecified: Secondary | ICD-10-CM | POA: Diagnosis not present

## 2015-12-25 DIAGNOSIS — I130 Hypertensive heart and chronic kidney disease with heart failure and stage 1 through stage 4 chronic kidney disease, or unspecified chronic kidney disease: Secondary | ICD-10-CM | POA: Diagnosis not present

## 2015-12-27 DIAGNOSIS — N183 Chronic kidney disease, stage 3 (moderate): Secondary | ICD-10-CM | POA: Diagnosis not present

## 2015-12-27 DIAGNOSIS — J449 Chronic obstructive pulmonary disease, unspecified: Secondary | ICD-10-CM | POA: Diagnosis not present

## 2015-12-27 DIAGNOSIS — R531 Weakness: Secondary | ICD-10-CM | POA: Diagnosis not present

## 2015-12-27 DIAGNOSIS — L89152 Pressure ulcer of sacral region, stage 2: Secondary | ICD-10-CM | POA: Diagnosis not present

## 2015-12-27 DIAGNOSIS — I5033 Acute on chronic diastolic (congestive) heart failure: Secondary | ICD-10-CM | POA: Diagnosis not present

## 2015-12-27 DIAGNOSIS — I130 Hypertensive heart and chronic kidney disease with heart failure and stage 1 through stage 4 chronic kidney disease, or unspecified chronic kidney disease: Secondary | ICD-10-CM | POA: Diagnosis not present

## 2015-12-31 ENCOUNTER — Ambulatory Visit
Admission: RE | Admit: 2015-12-31 | Discharge: 2015-12-31 | Disposition: A | Discharge status: Home / Self Care | Payer: Medicare Other | Source: Ambulatory Visit | Attending: Infectious Diseases | Admitting: Infectious Diseases

## 2015-12-31 DIAGNOSIS — Z1231 Encounter for screening mammogram for malignant neoplasm of breast: Secondary | ICD-10-CM | POA: Diagnosis not present

## 2016-01-11 DIAGNOSIS — H353231 Exudative age-related macular degeneration, bilateral, with active choroidal neovascularization: Secondary | ICD-10-CM | POA: Diagnosis not present

## 2016-01-16 DIAGNOSIS — H353211 Exudative age-related macular degeneration, right eye, with active choroidal neovascularization: Secondary | ICD-10-CM | POA: Diagnosis not present

## 2016-01-28 ENCOUNTER — Inpatient Hospital Stay: Payer: Medicare Other

## 2016-01-28 ENCOUNTER — Inpatient Hospital Stay (HOSPITAL_BASED_OUTPATIENT_CLINIC_OR_DEPARTMENT_OTHER): Payer: Medicare Other | Admitting: Oncology

## 2016-01-28 ENCOUNTER — Inpatient Hospital Stay: Payer: Medicare Other | Attending: Oncology

## 2016-01-28 ENCOUNTER — Other Ambulatory Visit: Payer: Self-pay

## 2016-01-28 VITALS — BP 154/60 | HR 80 | Temp 97.4°F | Resp 18 | Wt 236.8 lb

## 2016-01-28 DIAGNOSIS — I2510 Atherosclerotic heart disease of native coronary artery without angina pectoris: Secondary | ICD-10-CM | POA: Insufficient documentation

## 2016-01-28 DIAGNOSIS — Z803 Family history of malignant neoplasm of breast: Secondary | ICD-10-CM | POA: Diagnosis not present

## 2016-01-28 DIAGNOSIS — K449 Diaphragmatic hernia without obstruction or gangrene: Secondary | ICD-10-CM | POA: Insufficient documentation

## 2016-01-28 DIAGNOSIS — R531 Weakness: Secondary | ICD-10-CM | POA: Insufficient documentation

## 2016-01-28 DIAGNOSIS — E039 Hypothyroidism, unspecified: Secondary | ICD-10-CM

## 2016-01-28 DIAGNOSIS — E119 Type 2 diabetes mellitus without complications: Secondary | ICD-10-CM

## 2016-01-28 DIAGNOSIS — Z7982 Long term (current) use of aspirin: Secondary | ICD-10-CM | POA: Insufficient documentation

## 2016-01-28 DIAGNOSIS — Z7984 Long term (current) use of oral hypoglycemic drugs: Secondary | ICD-10-CM | POA: Insufficient documentation

## 2016-01-28 DIAGNOSIS — R5383 Other fatigue: Secondary | ICD-10-CM

## 2016-01-28 DIAGNOSIS — R32 Unspecified urinary incontinence: Secondary | ICD-10-CM | POA: Insufficient documentation

## 2016-01-28 DIAGNOSIS — I5032 Chronic diastolic (congestive) heart failure: Secondary | ICD-10-CM | POA: Diagnosis not present

## 2016-01-28 DIAGNOSIS — Z79899 Other long term (current) drug therapy: Secondary | ICD-10-CM

## 2016-01-28 DIAGNOSIS — E669 Obesity, unspecified: Secondary | ICD-10-CM | POA: Insufficient documentation

## 2016-01-28 DIAGNOSIS — M129 Arthropathy, unspecified: Secondary | ICD-10-CM

## 2016-01-28 DIAGNOSIS — N183 Chronic kidney disease, stage 3 (moderate): Secondary | ICD-10-CM

## 2016-01-28 DIAGNOSIS — D500 Iron deficiency anemia secondary to blood loss (chronic): Secondary | ICD-10-CM

## 2016-01-28 DIAGNOSIS — R0602 Shortness of breath: Secondary | ICD-10-CM

## 2016-01-28 DIAGNOSIS — I129 Hypertensive chronic kidney disease with stage 1 through stage 4 chronic kidney disease, or unspecified chronic kidney disease: Secondary | ICD-10-CM | POA: Diagnosis not present

## 2016-01-28 DIAGNOSIS — D509 Iron deficiency anemia, unspecified: Secondary | ICD-10-CM

## 2016-01-28 LAB — CBC WITH DIFFERENTIAL/PLATELET
BASOS ABS: 0 10*3/uL (ref 0–0.1)
BASOS PCT: 1 %
EOS ABS: 0.2 10*3/uL (ref 0–0.7)
EOS PCT: 2 %
HCT: 35 % (ref 35.0–47.0)
Hemoglobin: 11.6 g/dL — ABNORMAL LOW (ref 12.0–16.0)
LYMPHS PCT: 23 %
Lymphs Abs: 1.7 10*3/uL (ref 1.0–3.6)
MCH: 30.9 pg (ref 26.0–34.0)
MCHC: 33 g/dL (ref 32.0–36.0)
MCV: 93.5 fL (ref 80.0–100.0)
Monocytes Absolute: 0.6 10*3/uL (ref 0.2–0.9)
Monocytes Relative: 8 %
Neutro Abs: 5 10*3/uL (ref 1.4–6.5)
Neutrophils Relative %: 66 %
PLATELETS: 198 10*3/uL (ref 150–440)
RBC: 3.75 MIL/uL — AB (ref 3.80–5.20)
RDW: 16.6 % — ABNORMAL HIGH (ref 11.5–14.5)
WBC: 7.5 10*3/uL (ref 3.6–11.0)

## 2016-01-28 LAB — IRON AND TIBC
IRON: 47 ug/dL (ref 28–170)
SATURATION RATIOS: 16 % (ref 10.4–31.8)
TIBC: 292 ug/dL (ref 250–450)
UIBC: 245 ug/dL

## 2016-01-28 LAB — FERRITIN: Ferritin: 43 ng/mL (ref 11–307)

## 2016-01-28 NOTE — Progress Notes (Signed)
Complains of shortness of breath and fatigue today.

## 2016-01-28 NOTE — Progress Notes (Signed)
[place]  [phone] [phone]) [phone]  ID: Amy [name] OB: Mar 15, 1927  MR#: [id number]  WVP#:[id number]  Patient Care Team: Leonel Ramsay, MD as PCP - General (Infectious Diseases) Minna Merritts, MD as Consulting Physician (Cardiology)  CHIEF COMPLAINT: Iron deficiency anemia secondary to chronic blood loss.  INTERVAL HISTORY: Patient returns to clinic for repeat laboratory work and further evaluation. She continues to have complaints of increased shortness of breath, weakness and fatigue. She has no neurologic complaints. She denies any recent fevers or illnesses. She denies any chest pain. She denies any nausea, vomiting, constipation, or diarrhea. She has no urinary complaints. Patient offers no further specific complaints today.  REVIEW OF SYSTEMS:   Review of Systems  Constitutional: Positive for malaise/fatigue. Negative for fever and weight loss.  HENT: Negative.   Respiratory: Positive for shortness of breath. Negative for cough and hemoptysis.   Cardiovascular: Negative.  Negative for chest pain and leg swelling.  Gastrointestinal: Negative.  Negative for abdominal pain, blood in stool and melena.  Genitourinary: Negative.  Negative for hematuria.  Musculoskeletal: Negative.   Neurological: Positive for weakness.  Psychiatric/Behavioral: Negative.  The patient is not nervous/anxious.     As per HPI. Otherwise, a complete review of systems is negative.  PAST MEDICAL HISTORY: Past Medical History:  Diagnosis Date  . Bladder incontinence   . CAD (coronary artery disease)    a. cardiac cath 05/2009: proximal LAD 90% stenosis s/p PCI/Xience 2.75 x 12 mm DES, mid LAD 40%, diagonal 40%, proximal LCx 40% followed by 40% LCx lesion, 30%-40%-30% lesion noted in the RCA with distal RCA 30% and 25% lesions  . Chronic diastolic (congestive) heart failure    a. Lexiscan 2013: no ischemia, normal EF b. echo 09/2015: EF 55-60% w/ Grade 1  DD  . CKD (chronic kidney disease) stage 3, GFR 30-59 ml/min   . Degenerative arthritis of knee    bilateral knees  . Diabetes mellitus    Type II  . Hiatal hernia   . Hypertension   . Iron deficiency   . Menopausal symptoms   . Morbid obesity ([place])   . Thyroid disease    hypothyroidism    PAST SURGICAL HISTORY: Past Surgical History:  Procedure Laterality Date  . COLONOSCOPY  2015  . REPLACEMENT TOTAL KNEE BILATERAL    . TOTAL VAGINAL HYSTERECTOMY     ovarian mass, not cancerous  . UPPER GI ENDOSCOPY  2015    FAMILY HISTORY Family History  Problem Relation Age of Onset  . Breast cancer Mother 57       ADVANCED DIRECTIVES:    HEALTH MAINTENANCE: Social History  Substance Use Topics  . Smoking status: Never Smoker  . Smokeless tobacco: Never Used  . Alcohol use No     Colonoscopy:  PAP:  Bone density:  Lipid panel:  Allergies  Allergen Reactions  . Atenolol     Other reaction(s): Other (See Comments) Decreased heart rate  . Codeine     Rash, difficulty breathing, nausea.  . Rofecoxib     Other reaction(s): Unknown  . Sulfa Antibiotics     Other reaction(s): Unknown    Current Outpatient Prescriptions  Medication Sig Dispense Refill  . aspirin 81 MG tablet Take 81 mg by mouth daily.    . cloNIDine (CATAPRES - DOSED IN MG/24 HR) 0.3 mg/24hr patch Place 0.3 mg onto the skin once a week.     . clopidogrel (PLAVIX) 75 MG tablet Take 1  tablet (75 mg total) by mouth daily. 90 tablet 3  . doxazosin (CARDURA) 8 MG tablet Take 1 tablet (8 mg total) by mouth 2 (two) times daily. 180 tablet 3  . famotidine (PEPCID) 20 MG tablet Take 1 tablet (20 mg total) by mouth 2 (two) times daily. 180 tablet 1  . fluticasone (FLONASE) 50 MCG/ACT nasal spray Place into the nose.    . furosemide (LASIX) 20 MG tablet Take 1 tablet (20 mg total) by mouth 2 (two) times daily as needed. 180 tablet 3  . glimepiride (AMARYL) 4 MG tablet Take 4 mg by mouth daily before breakfast.     . ipratropium-albuterol (DUONEB) 0.5-2.5 (3) MG/3ML SOLN Take 3 mLs by nebulization every 4 (four) hours as needed (shortness of breath/wheezing.). 360 mL   . levothyroxine (SYNTHROID, LEVOTHROID) 125 MCG tablet Take 125 mcg by mouth daily.    Marland Kitchen losartan-hydrochlorothiazide (HYZAAR) 100-25 MG per tablet Take 1 tablet by mouth daily.      . metFORMIN (GLUCOPHAGE) 1000 MG tablet Take 1,000 mg by mouth 2 (two) times daily with a meal.    . Multiple Vitamins-Minerals (PRESERVISION AREDS PO) Take by mouth 2 (two) times daily.    Marland Kitchen oxybutynin (DITROPAN) 5 MG tablet Take 5 mg by mouth 1 dose over 46 hours.      . pantoprazole (PROTONIX) 40 MG tablet Take 40 mg by mouth daily.     Marland Kitchen senna (SENOKOT) 8.6 MG TABS tablet Take 1 tablet (8.6 mg total) by mouth daily as needed for mild constipation. 120 each 0  . simvastatin (ZOCOR) 40 MG tablet Take 1 tablet (40 mg total) by mouth at bedtime. (Patient taking differently: Take 40 mg by mouth daily. ) 90 tablet 3  . SYMBICORT 160-4.5 MCG/ACT inhaler Inhale 2 puffs into the lungs 2 (two) times daily.      No current facility-administered medications for this visit.     OBJECTIVE: Vitals:   01/28/16 1018  BP: (!) 154/60  Pulse: 80  Resp: 18  Temp: 97.4 F (36.3 C)     Body mass index is 43.31 kg/m.    ECOG FS:2 - Symptomatic, <50% confined to bed  General: Well-developed, well-nourished, no acute distress. Eyes: Pink conjunctiva, anicteric sclera. Lungs: Scattered wheezing. Heart: Regular rate and rhythm. No rubs, murmurs, or gallops. Abdomen: Soft, nontender, nondistended. No organomegaly noted, normoactive bowel sounds. Musculoskeletal: No edema, cyanosis, or clubbing. Neuro: Alert, answering all questions appropriately. Cranial nerves grossly intact. Skin: No rashes or petechiae noted. Psych: Normal affect.  LAB RESULTS:  Lab Results  Component Value Date   NA 138 10/22/2015   K 4.1 10/22/2015   CL 102 10/22/2015   CO2 26 10/22/2015    GLUCOSE 177 (H) 10/22/2015   BUN 43 (H) 10/22/2015   CREATININE 1.48 (H) 10/22/2015   CALCIUM 8.9 10/22/2015   PROT 5.4 (L) 10/22/2015   ALBUMIN 3.5 10/22/2015   AST 15 10/22/2015   ALT 17 10/22/2015   ALKPHOS 60 10/22/2015   BILITOT 0.5 10/22/2015   GFRNONAA 31 (L) 10/22/2015   GFRAA 36 (L) 10/22/2015    Lab Results  Component Value Date   WBC 7.5 01/28/2016   NEUTROABS 5.0 01/28/2016   HGB 11.6 (L) 01/28/2016   HCT 35.0 01/28/2016   MCV 93.5 01/28/2016   PLT 198 01/28/2016   Lab Results  Component Value Date   IRON 47 01/28/2016   TIBC 292 01/28/2016   IRONPCTSAT 16 01/28/2016    Lab Results  Component  Value Date   FERRITIN 43 01/28/2016    STUDIES: Mm Screening Breast Tomo Bilateral  Result Date: 12/31/2015 CLINICAL DATA:  Screening. EXAM: 2D DIGITAL SCREENING BILATERAL MAMMOGRAM WITH CAD AND ADJUNCT TOMO COMPARISON:  Previous exam(s). ACR Breast Density Category b: There are scattered areas of fibroglandular density. FINDINGS: There are no findings suspicious for malignancy. Images were processed with CAD. IMPRESSION: No mammographic evidence of malignancy. A result letter of this screening mammogram will be mailed directly to the patient. RECOMMENDATION: Screening mammogram in one year. (Code:SM-B-01Y) BI-RADS CATEGORY  1: Negative. Electronically Signed   By: Pamelia Hoit M.D.   On: 12/31/2015 17:17    ASSESSMENT:  Iron deficiency anemia secondary to chronic blood loss, shortness of breath.   PLAN:    1. Iron deficiency anemia secondary to chronic blood loss: Previously, patient was reported to be intolerant of oral iron supplementation. She also had an EGD and colonoscopy in May 2015 for heme positive stools but only revealed esophagitis and tubular adenomas in her colon. Patient's hemoglobin is significantly improved and her iron stores are now within normal limits. She does not require additional IV Feraheme today. Patient last received treatment in October 2017.  Return to clinic in 4 months with repeat laboratory work and further evaluation. 2. Shortness of breath:  Multifactorial. Continued evaluation and treatment per cardiology. 3. Renal insufficiency: Mild, monitor.  Patient expressed understanding and was in agreement with this plan. She also understands that She can call clinic at any time with any questions, concerns, or complaints.    Lloyd Huger, MD   01/29/2016 4:33 PM

## 2016-02-08 DIAGNOSIS — H353211 Exudative age-related macular degeneration, right eye, with active choroidal neovascularization: Secondary | ICD-10-CM | POA: Diagnosis not present

## 2016-02-11 NOTE — Progress Notes (Signed)
* [facility]     Assessment and Plan:  Dyspnea. -The patient's dyspnea is likely multifactorial from cardiomegaly with chronic diastolic congestive heart failure, bronchitis, atelectasis, morbid obesity, deconditioning. --Discussed that her breathing issues are chronic, there is no particular "fix" for this. Our focus will be to try to keep her function where it is at, and try to allow her to live independently for as long as possible.  -The patient would benefit from continued physical therapy but has completed this. Will therefore refer her to pulmonary rehab.  Atelectasis.  -Review CT of the chest shows atelectasis of the medial segment of the right middle lobe. This is most likely due to external compression, however an endobronchial tumor cannot be ruled out based on the scan. -She was encouraged to use her incentive spirometry and flutter valve. Continue PT.  -Given the presence of lung nodules, atelectasis, paratracheal lymphadenopathy, we could repeat CT but given advanced age, multiple comorbidites and presence of plavix, she would be at high risk of complications. Also, it is unlikely that any intervention are likely to prolong her life, therefore will defer bronchoscopy/repeat CT scanning at this time.   Asthma.  -Continue symbicort bid.  --s/p Prevnar-13 vaccines today.   Hiatal hernia/intrathoracic stomach. -Patient has a significant hiatal hernia, which is likely contributing to dyspnea and atelectasis. -The patient is likely at high risk of aspiration, continue to treat with antireflux measures. -Patient is no longer on protonix, will start famotidine.   CHF with cardiomegaly. -Patient has significant cardiomegaly noted on CT of the chest, this likely attributing to atelectasis.  Obesity. -BMI = 46 -Likely contributing to dyspnea, weight loss would be beneficial for her dyspnea. -Obesity is likely contributing to atelectasis due to external  compression from chest wall and diaphragm.    Date: 02/11/2016  MRN# [id number] Amy [name] 28-Oct-1927   Amy [name] is a 82 y.o. old female seen in follow up for chief complaint of  Chief Complaint  Patient presents with  . Follow-up    22morov. pt states breathing is baseline. pt c/o sob with with exertion. pt denies any cough, wheezing or chest discomfort.      HPI:   Patient is a 82year old female, with  bronchitis and congestive heart failure on 8/7. She was seen in the hospital and was noted to have bronchitis, obesity, RML atelectasis after being in the hospital for a week.  Given her comorbidities, we opted not to perform repeat CT scanning.   CT chest 10/09/15 in comparison with previous images mild RML atelectasis, no specific endobronchial lesion is noted.  This was likely due to atelectasis secondary to immobility from hospitalization. Endobronchial lesion seems unlikely given that the initial CT at the beginning of the hospitalization showed no atelectasis.   desat walk 11/12/15: Baseline oxygen sat today on RA at rest 96% and HR 98. After walking 20 feet pt sat was 97% and HR 108, significant dyspnea, unsteady gait, needed assistance when walking. Took a few minutes for breathing to recover.   Medication:   Outpatient Encounter Prescriptions as of 02/12/2016  Medication Sig  . aspirin 81 MG tablet Take 81 mg by mouth daily.  . cloNIDine (CATAPRES - DOSED IN MG/24 HR) 0.3 mg/24hr patch Place 0.3 mg onto the skin once a week.   . clopidogrel (PLAVIX) 75 MG tablet Take 1 tablet (75 mg total) by mouth daily.  .Marland Kitchendoxazosin (CARDURA) 8 MG tablet Take 1 tablet (8 mg total)  by mouth 2 (two) times daily.  . famotidine (PEPCID) 20 MG tablet Take 1 tablet (20 mg total) by mouth 2 (two) times daily.  . fluticasone (FLONASE) 50 MCG/ACT nasal spray Place into the nose.  . furosemide (LASIX) 20 MG tablet Take 1 tablet (20 mg total) by mouth 2 (two) times daily as needed.   Marland Kitchen glimepiride (AMARYL) 4 MG tablet Take 4 mg by mouth daily before breakfast.  . ipratropium-albuterol (DUONEB) 0.5-2.5 (3) MG/3ML SOLN Take 3 mLs by nebulization every 4 (four) hours as needed (shortness of breath/wheezing.).  Marland Kitchen levothyroxine (SYNTHROID, LEVOTHROID) 125 MCG tablet Take 125 mcg by mouth daily.  Marland Kitchen losartan-hydrochlorothiazide (HYZAAR) 100-25 MG per tablet Take 1 tablet by mouth daily.    . metFORMIN (GLUCOPHAGE) 1000 MG tablet Take 1,000 mg by mouth 2 (two) times daily with a meal.  . Multiple Vitamins-Minerals (PRESERVISION AREDS PO) Take by mouth 2 (two) times daily.  Marland Kitchen oxybutynin (DITROPAN) 5 MG tablet Take 5 mg by mouth 1 dose over 46 hours.    . pantoprazole (PROTONIX) 40 MG tablet Take 40 mg by mouth daily.   Marland Kitchen senna (SENOKOT) 8.6 MG TABS tablet Take 1 tablet (8.6 mg total) by mouth daily as needed for mild constipation.  . simvastatin (ZOCOR) 40 MG tablet Take 1 tablet (40 mg total) by mouth at bedtime. (Patient taking differently: Take 40 mg by mouth daily. )  . SYMBICORT 160-4.5 MCG/ACT inhaler Inhale 2 puffs into the lungs 2 (two) times daily.    No facility-administered encounter medications on file as of 02/12/2016.      Allergies:  Atenolol; Codeine; Rofecoxib; and Sulfa antibiotics  Review of Systems: Gen:  Denies  fever, sweats. HEENT: Denies blurred vision. Cvc:  No dizziness, chest pain or heaviness Resp:   Denies cough or sputum porduction. Gi: Denies swallowing difficulty, stomach pain. constipation, bowel incontinence Gu:  Denies bladder incontinence, burning urine Ext:   No Joint pain, stiffness. Skin: No skin rash, easy bruising. Endoc:  No polyuria, polydipsia. Psych: No depression, insomnia. Other:  All other systems were reviewed and found to be negative other than what is mentioned in the HPI.   Physical Examination:   VS: BP 130/68 (BP Location: Left Arm, Cuff Size: Normal)   Pulse 89   Ht '5\' 2"'$  (1.575 m)   Wt 104.6 kg (230 lb 9.6 oz)    SpO2 97%   BMI 42.18 kg/m   General Appearance: No distress  Neuro:without focal findings,  speech normal,  HEENT: PERRLA, EOM intact. Pulmonary: normal breath sounds, No wheezing.   CardiovascularNormal S1,S2.  No m/r/g.   Abdomen: Benign, Soft, non-tender. Renal:  No costovertebral tenderness  GU:  Not performed at this time. Endoc: No evident thyromegaly, no signs of acromegaly. Skin:   warm, no rash. Extremities: normal, no cyanosis, clubbing.   LABORATORY PANEL:   CBC No results for input(s): WBC, HGB, HCT, PLT in the last 168 hours. ------------------------------------------------------------------------------------------------------------------  Chemistries  No results for input(s): NA, K, CL, CO2, GLUCOSE, BUN, CREATININE, CALCIUM, MG, AST, ALT, ALKPHOS, BILITOT in the last 168 hours.  Invalid input(s): GFRCGP ------------------------------------------------------------------------------------------------------------------  Cardiac Enzymes No results for input(s): TROPONINI in the last 168 hours. ------------------------------------------------------------  RADIOLOGY:   No results found for this or any previous visit. Results for orders placed during the hospital encounter of 12/28/14  DG Chest 2 View   Narrative CLINICAL DATA:  Hemoptysis this morning ; shortness of breath on exertion, history of asthma, chronic CHF, obesity, and  diabetes.  EXAM: CHEST  2 VIEW  COMPARISON:  Portable chest x-ray of October 11, 2013  FINDINGS: The lungs are well-expanded. There is no focal infiltrate. The interstitial markings are coarse. The heart is top-normal in size. The pulmonary vascularity is not engorged. There is no pleural effusion. There is a moderate-sized hiatal hernia. There is apical pleural thickening bilaterally. There is mild degenerative disc space narrowing of the mid thoracic spine.  IMPRESSION: Acute on chronic bronchitic change.  There is no  evidence of CHF.   Electronically Signed   By: David  Martinique M.D.   On: 12/28/2014 13:21    ------------------------------------------------------------------------------------------------------------------  Thank  you for allowing [facility] to assist in the care of your patient. Our recommendations are noted above.  Please contact us if we can be of further service.   Marda Stalker, MD.  [facility] Office Number: [phone]  Patricia Pesa, M.D.  Vilinda Boehringer, M.D.  Merton Border, M.D  02/11/2016

## 2016-02-12 ENCOUNTER — Encounter: Payer: Self-pay | Admitting: Internal Medicine

## 2016-02-12 ENCOUNTER — Ambulatory Visit (INDEPENDENT_AMBULATORY_CARE_PROVIDER_SITE_OTHER): Payer: Medicare Other | Admitting: Internal Medicine

## 2016-02-12 VITALS — BP 130/68 | HR 89 | Ht 62.0 in | Wt 230.6 lb

## 2016-02-12 DIAGNOSIS — J9811 Atelectasis: Secondary | ICD-10-CM

## 2016-02-12 DIAGNOSIS — I6523 Occlusion and stenosis of bilateral carotid arteries: Secondary | ICD-10-CM

## 2016-02-12 DIAGNOSIS — J9610 Chronic respiratory failure, unspecified whether with hypoxia or hypercapnia: Secondary | ICD-10-CM | POA: Diagnosis not present

## 2016-02-12 NOTE — Patient Instructions (Signed)
--   Our goal will to try to keep  breathing where it is at, and try to prevent it from getting worse.   --Will refer to pulmonary rehab.

## 2016-03-04 ENCOUNTER — Encounter: Payer: Medicare Other | Attending: Cardiovascular Disease | Admitting: Respiratory Therapy

## 2016-03-04 VITALS — Ht 60.5 in | Wt 229.0 lb

## 2016-03-04 DIAGNOSIS — I509 Heart failure, unspecified: Secondary | ICD-10-CM | POA: Insufficient documentation

## 2016-03-04 DIAGNOSIS — I5032 Chronic diastolic (congestive) heart failure: Secondary | ICD-10-CM

## 2016-03-04 NOTE — Patient Instructions (Signed)
Patient Instructions  Patient Details  Name: Amy [name] MRN: [id number] Date of Birth: June 17, 1927 Referring Provider:  Minna Merritts, MD  Below are the personal goals you chose as well as exercise and nutrition goals. Our goal is to help you keep on track towards obtaining and maintaining your goals. We will be discussing your progress on these goals with you throughout the program.  Initial Exercise Prescription:     Initial Exercise Prescription - 03/04/16 1200      Date of Initial Exercise RX and Referring Provider   Date 03/04/16   Referring Provider Gollan     Treadmill   MPH 0.5   Grade 0   Minutes 15  1/1/1/1     NuStep   Level 1   Minutes 15   METs 1.5     T5 Nustep   Level 1   Minutes 15   METs 1.5     Biostep-RELP   Level 1   Minutes 15   METs 1.5     Prescription Details   Frequency (times per week) 3   Duration Progress to 30 minutes of continuous aerobic without signs/symptoms of physical distress     Intensity   THRR 40-80% of Max Heartrate 107-125   Ratings of Perceived Exertion 11-13   Perceived Dyspnea 0-4     Progression   Progression Continue to progress workloads to maintain intensity without signs/symptoms of physical distress.     Resistance Training   Training Prescription Yes   Weight 1   Reps 10-15      Exercise Goals: Frequency: Be able to perform aerobic exercise three times per week working toward 3-5 days per week.  Intensity: Work with a perceived exertion of 11 (fairly light) - 15 (hard) as tolerated. Follow your new exercise prescription and watch for changes in prescription as you progress with the program. Changes will be reviewed with you when they are made.  Duration: You should be able to do 30 minutes of continuous aerobic exercise in addition to a 5 minute warm-up and a 5 minute cool-down routine.  Nutrition Goals: Your personal nutrition goals will be established when you do your nutrition analysis  with the dietician.  The following are nutrition guidelines to follow: Cholesterol < '200mg'$ /day Sodium < '1500mg'$ /day Fiber: Women over 50 yrs - 21 grams per day  Personal Goals:     Personal Goals and Risk Factors at Admission - 03/04/16 1212      Core Components/Risk Factors/Patient Goals on Admission   Sedentary Yes  Goal: improve walking   Intervention Provide advice, education, support and counseling about physical activity/exercise needs.;Develop an individualized exercise prescription for aerobic and resistive training based on initial evaluation findings, risk stratification, comorbidities and participant's personal goals.   Expected Outcomes Achievement of increased cardiorespiratory fitness and enhanced flexibility, muscular endurance and strength shown through measurements of functional capacity and personal statement of participant.   Increase Strength and Stamina Yes   Intervention Provide advice, education, support and counseling about physical activity/exercise needs.;Develop an individualized exercise prescription for aerobic and resistive training based on initial evaluation findings, risk stratification, comorbidities and participant's personal goals.   Expected Outcomes Achievement of increased cardiorespiratory fitness and enhanced flexibility, muscular endurance and strength shown through measurements of functional capacity and personal statement of participant.   Improve shortness of breath with ADL's Yes   Intervention Provide education, individualized exercise plan and daily activity instruction to help decrease symptoms of SOB with activities of daily  living.   Expected Outcomes Short Term: Achieves a reduction of symptoms when performing activities of daily living.   Develop more efficient breathing techniques such as purse lipped breathing and diaphragmatic breathing; and practicing self-pacing with activity Yes   Intervention Provide education, demonstration and  support about specific breathing techniuqes utilized for more efficient breathing. Include techniques such as pursed lipped breathing, diaphragmatic breathing and self-pacing activity.   Expected Outcomes Short Term: Participant will be able to demonstrate and use breathing techniques as needed throughout daily activities.   Increase knowledge of respiratory medications and ability to use respiratory devices properly  Yes   Intervention Provide education and demonstration as needed of appropriate use of medications, inhalers, and oxygen therapy.  Duoneb SVN, Symbicort, Flutter Valve, Incentive Spirometer   Expected Outcomes Short Term: Achieves understanding of medications use. Understands that oxygen is a medication prescribed by physician. Demonstrates appropriate use of inhaler and oxygen therapy.   Diabetes Yes   Intervention Provide education about signs/symptoms and action to take for hypo/hyperglycemia.;Provide education about proper nutrition, including hydration, and aerobic/resistive exercise prescription along with prescribed medications to achieve blood glucose in normal ranges: Fasting glucose 65-99 mg/dL   Expected Outcomes Short Term: Participant verbalizes understanding of the signs/symptoms and immediate care of hyper/hypoglycemia, proper foot care and importance of medication, aerobic/resistive exercise and nutrition plan for blood glucose control.;Long Term: Attainment of HbA1C < 7%.   Heart Failure Yes   Intervention Provide a combined exercise and nutrition program that is supplemented with education, support and counseling about heart failure. Directed toward relieving symptoms such as shortness of breath, decreased exercise tolerance, and extremity edema.   Expected Outcomes Improve functional capacity of life;Short term: Attendance in program 2-3 days a week with increased exercise capacity. Reported lower sodium intake. Reported increased fruit and vegetable intake. Reports  medication compliance.;Short term: Daily weights obtained and reported for increase. Utilizing diuretic protocols set by physician.;Long term: Adoption of self-care skills and reduction of barriers for early signs and symptoms recognition and intervention leading to self-care maintenance.   Hypertension Yes   Intervention Provide education on lifestyle modifcations including regular physical activity/exercise, weight management, moderate sodium restriction and increased consumption of fresh fruit, vegetables, and low fat dairy, alcohol moderation, and smoking cessation.;Monitor prescription use compliance.   Expected Outcomes Short Term: Continued assessment and intervention until BP is < 140/66m HG in hypertensive participants. < 130/861mHG in hypertensive participants with diabetes, heart failure or chronic kidney disease.;Long Term: Maintenance of blood pressure at goal levels.   Lipids Yes   Intervention Provide education and support for participant on nutrition & aerobic/resistive exercise along with prescribed medications to achieve LDL '70mg'$ , HDL >'40mg'$ .   Expected Outcomes Short Term: Participant states understanding of desired cholesterol values and is compliant with medications prescribed. Participant is following exercise prescription and nutrition guidelines.;Long Term: Cholesterol controlled with medications as prescribed, with individualized exercise RX and with personalized nutrition plan. Value goals: LDL < '70mg'$ , HDL > 40 mg.      Tobacco Use Initial Evaluation: History  Smoking Status   Never Smoker  Smokeless Tobacco   Never Used    Copy of goals given to participant.

## 2016-03-04 NOTE — Progress Notes (Signed)
Pulmonary Individual Treatment Plan  Patient Details  Name: Amy [name] MRN: [id number] Date of Birth: [birth year] Referring Provider:   Flowsheet Row Pulmonary Rehab from 03/04/2016 in [facility] Cardiac and Pulmonary Rehab  Referring Provider  Gollan      Initial Encounter Date:  Flowsheet Row Pulmonary Rehab from 03/04/2016 in [facility] Cardiac and Pulmonary Rehab  Date  03/04/16  Referring Provider  Rockey Situ      Visit Diagnosis: Chronic diastolic congestive heart failure ([place])  Patient's Home Medications on Admission:  Current Outpatient Prescriptions:    aspirin 81 MG tablet, Take 81 mg by mouth daily., Disp: , Rfl:    cloNIDine (CATAPRES - DOSED IN MG/24 HR) 0.3 mg/24hr patch, Place 0.3 mg onto the skin once a week. , Disp: , Rfl:    clopidogrel (PLAVIX) 75 MG tablet, Take 1 tablet (75 mg total) by mouth daily., Disp: 90 tablet, Rfl: 3   doxazosin (CARDURA) 8 MG tablet, Take 1 tablet (8 mg total) by mouth 2 (two) times daily., Disp: 180 tablet, Rfl: 3   famotidine (PEPCID) 20 MG tablet, Take 1 tablet (20 mg total) by mouth 2 (two) times daily., Disp: 180 tablet, Rfl: 1   fluticasone (FLONASE) 50 MCG/ACT nasal spray, Place into the nose., Disp: , Rfl:    furosemide (LASIX) 20 MG tablet, Take 1 tablet (20 mg total) by mouth 2 (two) times daily as needed., Disp: 180 tablet, Rfl: 3   glimepiride (AMARYL) 4 MG tablet, Take 4 mg by mouth daily before breakfast., Disp: , Rfl:    ipratropium-albuterol (DUONEB) 0.5-2.5 (3) MG/3ML SOLN, Take 3 mLs by nebulization every 4 (four) hours as needed (shortness of breath/wheezing.)., Disp: 360 mL, Rfl:    levothyroxine (SYNTHROID, LEVOTHROID) 125 MCG tablet, Take 125 mcg by mouth daily., Disp: , Rfl:    losartan-hydrochlorothiazide (HYZAAR) 100-25 MG per tablet, Take 1 tablet by mouth daily.  , Disp: , Rfl:    metFORMIN (GLUCOPHAGE) 1000 MG tablet, Take 1,000 mg by mouth 2 (two) times daily with a meal., Disp: , Rfl:    Multiple  Vitamins-Minerals (PRESERVISION AREDS PO), Take by mouth 2 (two) times daily., Disp: , Rfl:    oxybutynin (DITROPAN) 5 MG tablet, Take 5 mg by mouth 1 dose over 46 hours.  , Disp: , Rfl:    pantoprazole (PROTONIX) 40 MG tablet, Take 40 mg by mouth daily. , Disp: , Rfl:    senna (SENOKOT) 8.6 MG TABS tablet, Take 1 tablet (8.6 mg total) by mouth daily as needed for mild constipation., Disp: 120 each, Rfl: 0   simvastatin (ZOCOR) 40 MG tablet, Take 1 tablet (40 mg total) by mouth at bedtime. (Patient taking differently: Take 40 mg by mouth daily. ), Disp: 90 tablet, Rfl: 3   SYMBICORT 160-4.5 MCG/ACT inhaler, Inhale 2 puffs into the lungs 2 (two) times daily. , Disp: , Rfl:   Past Medical History: Past Medical History:  Diagnosis Date   Bladder incontinence    CAD (coronary artery disease)    a. cardiac cath 05/2009: proximal LAD 90% stenosis s/p PCI/Xience 2.75 x 12 mm DES, mid LAD 40%, diagonal 40%, proximal LCx 40% followed by 40% LCx lesion, 30%-40%-30% lesion noted in the RCA with distal RCA 30% and 25% lesions   Chronic diastolic (congestive) heart failure    a. Lexiscan 2013: no ischemia, normal EF b. echo 09/2015: EF 55-60% w/ Grade 1 DD   CKD (chronic kidney disease) stage 3, GFR 30-59 ml/min    Degenerative arthritis  of knee    bilateral knees   Diabetes mellitus    Type II   Hiatal hernia    Hypertension    Iron deficiency    Menopausal symptoms    Morbid obesity ([place])    Thyroid disease    hypothyroidism    Tobacco Use: History  Smoking Status   Never Smoker  Smokeless Tobacco   Never Used    Labs: Recent Review Flowsheet Data    Labs for ITP Cardiac and Pulmonary Rehab Latest Ref Rng & Units 05/07/2009 10/18/2009 06/25/2010 10/15/2012 10/01/2015   Cholestrol 0 - 200 mg/dL 148 147 135 - -   LDLCALC 0 - 99 mg/dL 80 80 56 - -   HDL >39 mg/dL 29.2 40 35(L) - -   Trlycerides <150 mg/dL 194 133 220(H) - -   Hemoglobin A1c 4.0 - 6.0 % 6.3 - - 8.6(H) 5.6        ADL UCSD:     Pulmonary Assessment Scores    Row Name 03/04/16 1208         ADL UCSD   ADL Phase Entry     SOB Score total 76     Rest 0     Walk 0     Stairs 5     Bath 2     Dress 3     Shop 5        Pulmonary Function Assessment:     Pulmonary Function Assessment - 03/04/16 1207      Initial Spirometry Results   FVC% 107 %   FEV1% 130 %   FEV1/FVC Ratio 89.63     Breath   Bilateral Breath Sounds Clear   Shortness of Breath Yes;Limiting activity;Fear of Shortness of Breath      Exercise Target Goals: Date: 03/04/16  Exercise Program Goal: Individual exercise prescription set with THRR, safety & activity barriers. Participant demonstrates ability to understand and report RPE using BORG scale, to self-measure pulse accurately, and to acknowledge the importance of the exercise prescription.  Exercise Prescription Goal: Starting with aerobic activity 30 plus minutes a day, 3 days per week for initial exercise prescription. Provide home exercise prescription and guidelines that participant acknowledges understanding prior to discharge.  Activity Barriers & Risk Stratification:     Activity Barriers & Cardiac Risk Stratification - 03/04/16 1206      Activity Barriers & Cardiac Risk Stratification   Activity Barriers Shortness of Breath;Deconditioning;Muscular Weakness   Cardiac Risk Stratification Moderate      6 Minute Walk:     6 Minute Walk    Row Name 03/04/16 1223         6 Minute Walk   Distance 90 feet     Walk Time 1 minutes     # of Rest Breaks 2     MPH 0.9     METS 1.7     RPE 17     Perceived Dyspnea  4     VO2 Peak 5.9     Symptoms Yes (comment)     Comments short of breath     Resting HR 89 bpm     Resting BP 122/52     Max Ex. HR 112 bpm     Max Ex. BP 152/42        Initial Exercise Prescription:     Initial Exercise Prescription - 03/04/16 1200      Date of Initial Exercise RX and Referring Provider   Date  03/04/16  Referring Provider Gollan     Treadmill   MPH 0.5   Grade 0   Minutes 15  1/1/1/1     NuStep   Level 1   Minutes 15   METs 1.5     T5 Nustep   Level 1   Minutes 15   METs 1.5     Biostep-RELP   Level 1   Minutes 15   METs 1.5     Prescription Details   Frequency (times per week) 3   Duration Progress to 30 minutes of continuous aerobic without signs/symptoms of physical distress     Intensity   THRR 40-80% of Max Heartrate 107-125   Ratings of Perceived Exertion 11-13   Perceived Dyspnea 0-4     Progression   Progression Continue to progress workloads to maintain intensity without signs/symptoms of physical distress.     Resistance Training   Training Prescription Yes   Weight 1   Reps 10-15      Perform Capillary Blood Glucose checks as needed.  Exercise Prescription Changes:   Exercise Comments:   Discharge Exercise Prescription (Final Exercise Prescription Changes):    Nutrition:  Target Goals: Understanding of nutrition guidelines, daily intake of sodium '1500mg'$ , cholesterol '200mg'$ , calories 30% from fat and 7% or less from saturated fats, daily to have 5 or more servings of fruits and vegetables.  Biometrics:     Pre Biometrics - 03/04/16 1222      Pre Biometrics   Height 5' 0.5" (1.537 m)   Weight 229 lb (103.9 kg)   BMI (Calculated) 44.1       Nutrition Therapy Plan and Nutrition Goals:   Nutrition Discharge: Rate Your Plate Scores:   Psychosocial: Target Goals: Acknowledge presence or absence of depression, maximize coping skills, provide positive support system. Participant is able to verbalize types and ability to use techniques and skills needed for reducing stress and depression.  Initial Review & Psychosocial Screening:     Initial Psych Review & Screening - 03/04/16 [place]? Yes   Comments Ms Ivie has good support from her daughter and son, although the son who  lives in [place] helps the most with errands and household chores. She has sadness over the death of her one daughter who died of a stroke. Ms Judon is looking forward to walking more and being nore active.     Barriers   Psychosocial barriers to participate in program The patient should benefit from training in stress management and relaxation.     Screening Interventions   Interventions Encouraged to exercise;Program counselor consult      Quality of Life Scores:     Quality of Life - 03/04/16 1218      Quality of Life Scores   Health/Function Pre 21 %   Socioeconomic Pre 21 %   Psych/Spiritual Pre 21 %   Family Pre 21 %   GLOBAL Pre 21 %      PHQ-9: Recent Review Flowsheet Data    Depression screen [facility] 2/9 03/04/2016   Decreased Interest 3   Down, Depressed, Hopeless 2   PHQ - 2 Score 5   Altered sleeping 2   Tired, decreased energy 3   Change in appetite 2   Feeling bad or failure about yourself  1   Trouble concentrating 1   Moving slowly or fidgety/restless 0   Suicidal thoughts 0   PHQ-9 Score 14   Difficult  doing work/chores Somewhat difficult      Psychosocial Evaluation and Intervention:   Psychosocial Re-Evaluation:  Education: Education Goals: Education classes will be provided on a weekly basis, covering required topics. Participant will state understanding/return demonstration of topics presented.  Learning Barriers/Preferences:     Learning Barriers/Preferences - 03/04/16 1206      Learning Barriers/Preferences   Learning Barriers None   Learning Preferences None      Education Topics: Initial Evaluation Education: - Verbal, written and demonstration of respiratory meds, RPE/PD scales, oximetry and breathing techniques. Instruction on use of nebulizers and MDIs: cleaning and proper use, rinsing mouth with steroid doses and importance of monitoring MDI activations. Flowsheet Row Pulmonary Rehab from 03/04/2016 in [facility] Cardiac and Pulmonary  Rehab  Date  03/04/16  Educator  LB  Instruction Review Code  2- meets goals/outcomes      General Nutrition Guidelines/Fats and Fiber: -Group instruction provided by verbal, written material, models and posters to present the general guidelines for heart healthy nutrition. Gives an explanation and review of dietary fats and fiber.   Controlling Sodium/Reading Food Labels: -Group verbal and written material supporting the discussion of sodium use in heart healthy nutrition. Review and explanation with models, verbal and written materials for utilization of the food label.   Exercise Physiology & Risk Factors: - Group verbal and written instruction with models to review the exercise physiology of the cardiovascular system and associated critical values. Details cardiovascular disease risk factors and the goals associated with each risk factor.   Aerobic Exercise & Resistance Training: - Gives group verbal and written discussion on the health impact of inactivity. On the components of aerobic and resistive training programs and the benefits of this training and how to safely progress through these programs.   Flexibility, Balance, General Exercise Guidelines: - Provides group verbal and written instruction on the benefits of flexibility and balance training programs. Provides general exercise guidelines with specific guidelines to those with heart or lung disease. Demonstration and skill practice provided.   Stress Management: - Provides group verbal and written instruction about the health risks of elevated stress, cause of high stress, and healthy ways to reduce stress.   Depression: - Provides group verbal and written instruction on the correlation between heart/lung disease and depressed mood, treatment options, and the stigmas associated with seeking treatment.   Exercise & Equipment Safety: - Individual verbal instruction and demonstration of equipment use and safety with use  of the equipment.   Infection Prevention: - Provides verbal and written material to individual with discussion of infection control including proper hand washing and proper equipment cleaning during exercise session. Flowsheet Row Pulmonary Rehab from 03/04/2016 in [facility] Cardiac and Pulmonary Rehab  Date  03/04/16  Educator  LB  Instruction Review Code  2- meets goals/outcomes      Falls Prevention: - Provides verbal and written material to individual with discussion of falls prevention and safety. Flowsheet Row Pulmonary Rehab from 03/04/2016 in [facility] Cardiac and Pulmonary Rehab  Date  03/04/16  Educator  LB  Instruction Review Code  2- meets goals/outcomes      Diabetes: - Individual verbal and written instruction to review signs/symptoms of diabetes, desired ranges of glucose level fasting, after meals and with exercise. Advice that pre and post exercise glucose checks will be done for 3 sessions at entry of program.   Chronic Lung Diseases: - Group verbal and written instruction to review new updates, new respiratory medications, new advancements in procedures and  treatments. Provide informative websites and "800" numbers of self-education.   Lung Procedures: - Group verbal and written instruction to describe testing methods done to diagnose lung disease. Review the outcome of test results. Describe the treatment choices: Pulmonary Function Tests, ABGs and oximetry.   Energy Conservation: - Provide group verbal and written instruction for methods to conserve energy, plan and organize activities. Instruct on pacing techniques, use of adaptive equipment and posture/positioning to relieve shortness of breath.   Triggers: - Group verbal and written instruction to review types of environmental controls: home humidity, furnaces, filters, dust mite/pet prevention, HEPA vacuums. To discuss weather changes, air quality and the benefits of nasal washing.   Exacerbations: - Group verbal  and written instruction to provide: warning signs, infection symptoms, calling MD promptly, preventive modes, and value of vaccinations. Review: effective airway clearance, coughing and/or vibration techniques. Create an Sports administrator.   Oxygen: - Individual and group verbal and written instruction on oxygen therapy. Includes supplement oxygen, available portable oxygen systems, continuous and intermittent flow rates, oxygen safety, concentrators, and Medicare reimbursement for oxygen.   Respiratory Medications: - Group verbal and written instruction to review medications for lung disease. Drug class, frequency, complications, importance of spacers, rinsing mouth after steroid MDI's, and proper cleaning methods for nebulizers.   AED/CPR: - Group verbal and written instruction with the use of models to demonstrate the basic use of the AED with the basic ABC's of resuscitation.   Breathing Retraining: - Provides individuals verbal and written instruction on purpose, frequency, and proper technique of diaphragmatic breathing and pursed-lipped breathing. Applies individual practice skills. Flowsheet Row Pulmonary Rehab from 03/04/2016 in [facility] Cardiac and Pulmonary Rehab  Date  03/04/16  Educator  LB  Instruction Review Code  2- meets goals/outcomes      Anatomy and Physiology of the Lungs: - Group verbal and written instruction with the use of models to provide basic lung anatomy and physiology related to function, structure and complications of lung disease.   Heart Failure: - Group verbal and written instruction on the basics of heart failure: signs/symptoms, treatments, explanation of ejection fraction, enlarged heart and cardiomyopathy.   Sleep Apnea: - Individual verbal and written instruction to review Obstructive Sleep Apnea. Review of risk factors, methods for diagnosing and types of masks and machines for OSA.   Anxiety: - Provides group, verbal and written instruction on the  correlation between heart/lung disease and anxiety, treatment options, and management of anxiety.   Relaxation: - Provides group, verbal and written instruction about the benefits of relaxation for patients with heart/lung disease. Also provides patients with examples of relaxation techniques.   Knowledge Questionnaire Score:     Knowledge Questionnaire Score - 03/04/16 1206      Knowledge Questionnaire Score   Pre Score 6/10       Core Components/Risk Factors/Patient Goals at Admission:     Personal Goals and Risk Factors at Admission - 03/04/16 1212      Core Components/Risk Factors/Patient Goals on Admission   Sedentary Yes  Goal: improve walking   Intervention Provide advice, education, support and counseling about physical activity/exercise needs.;Develop an individualized exercise prescription for aerobic and resistive training based on initial evaluation findings, risk stratification, comorbidities and participant's personal goals.   Expected Outcomes Achievement of increased cardiorespiratory fitness and enhanced flexibility, muscular endurance and strength shown through measurements of functional capacity and personal statement of participant.   Increase Strength and Stamina Yes   Intervention Provide advice, education, support and counseling  about physical activity/exercise needs.;Develop an individualized exercise prescription for aerobic and resistive training based on initial evaluation findings, risk stratification, comorbidities and participant's personal goals.   Expected Outcomes Achievement of increased cardiorespiratory fitness and enhanced flexibility, muscular endurance and strength shown through measurements of functional capacity and personal statement of participant.   Improve shortness of breath with ADL's Yes   Intervention Provide education, individualized exercise plan and daily activity instruction to help decrease symptoms of SOB with activities of daily  living.   Expected Outcomes Short Term: Achieves a reduction of symptoms when performing activities of daily living.   Develop more efficient breathing techniques such as purse lipped breathing and diaphragmatic breathing; and practicing self-pacing with activity Yes   Intervention Provide education, demonstration and support about specific breathing techniuqes utilized for more efficient breathing. Include techniques such as pursed lipped breathing, diaphragmatic breathing and self-pacing activity.   Expected Outcomes Short Term: Participant will be able to demonstrate and use breathing techniques as needed throughout daily activities.   Increase knowledge of respiratory medications and ability to use respiratory devices properly  Yes   Intervention Provide education and demonstration as needed of appropriate use of medications, inhalers, and oxygen therapy.  Duoneb SVN, Symbicort, Flutter Valve, Incentive Spirometer   Expected Outcomes Short Term: Achieves understanding of medications use. Understands that oxygen is a medication prescribed by physician. Demonstrates appropriate use of inhaler and oxygen therapy.   Diabetes Yes   Intervention Provide education about signs/symptoms and action to take for hypo/hyperglycemia.;Provide education about proper nutrition, including hydration, and aerobic/resistive exercise prescription along with prescribed medications to achieve blood glucose in normal ranges: Fasting glucose 65-99 mg/dL   Expected Outcomes Short Term: Participant verbalizes understanding of the signs/symptoms and immediate care of hyper/hypoglycemia, proper foot care and importance of medication, aerobic/resistive exercise and nutrition plan for blood glucose control.;Long Term: Attainment of HbA1C < 7%.   Heart Failure Yes   Intervention Provide a combined exercise and nutrition program that is supplemented with education, support and counseling about heart failure. Directed toward  relieving symptoms such as shortness of breath, decreased exercise tolerance, and extremity edema.   Expected Outcomes Improve functional capacity of life;Short term: Attendance in program 2-3 days a week with increased exercise capacity. Reported lower sodium intake. Reported increased fruit and vegetable intake. Reports medication compliance.;Short term: Daily weights obtained and reported for increase. Utilizing diuretic protocols set by physician.;Long term: Adoption of self-care skills and reduction of barriers for early signs and symptoms recognition and intervention leading to self-care maintenance.   Hypertension Yes   Intervention Provide education on lifestyle modifcations including regular physical activity/exercise, weight management, moderate sodium restriction and increased consumption of fresh fruit, vegetables, and low fat dairy, alcohol moderation, and smoking cessation.;Monitor prescription use compliance.   Expected Outcomes Short Term: Continued assessment and intervention until BP is < 140/71m HG in hypertensive participants. < 130/881mHG in hypertensive participants with diabetes, heart failure or chronic kidney disease.;Long Term: Maintenance of blood pressure at goal levels.   Lipids Yes   Intervention Provide education and support for participant on nutrition & aerobic/resistive exercise along with prescribed medications to achieve LDL '70mg'$ , HDL >'40mg'$ .   Expected Outcomes Short Term: Participant states understanding of desired cholesterol values and is compliant with medications prescribed. Participant is following exercise prescription and nutrition guidelines.;Long Term: Cholesterol controlled with medications as prescribed, with individualized exercise RX and with personalized nutrition plan. Value goals: LDL < '70mg'$ , HDL > 40 mg.  Core Components/Risk Factors/Patient Goals Review:    Core Components/Risk Factors/Patient Goals at Discharge (Final Review):    ITP  Comments:   Comments: Ms Villwock plans to start [facility] on 03/10/16 and attend 2 days/week.

## 2016-03-10 ENCOUNTER — Encounter: Payer: Medicare Other | Admitting: *Deleted

## 2016-03-10 DIAGNOSIS — I509 Heart failure, unspecified: Secondary | ICD-10-CM | POA: Diagnosis not present

## 2016-03-10 DIAGNOSIS — I5032 Chronic diastolic (congestive) heart failure: Secondary | ICD-10-CM

## 2016-03-10 LAB — GLUCOSE, CAPILLARY
GLUCOSE-CAPILLARY: 141 mg/dL — AB (ref 65–99)
Glucose-Capillary: 152 mg/dL — ABNORMAL HIGH (ref 65–99)

## 2016-03-10 NOTE — Progress Notes (Signed)
Pulmonary Individual Treatment Plan  Patient Details  Name: Amy [name] MRN: [id number] Date of Birth: 1927/05/24 Referring Provider:   Flowsheet Row Pulmonary Rehab from 03/04/2016 in [facility] Cardiac and Pulmonary Rehab  Referring Provider  Gollan      Initial Encounter Date:  Flowsheet Row Pulmonary Rehab from 03/04/2016 in [facility] Cardiac and Pulmonary Rehab  Date  03/04/16  Referring Provider  Rockey Situ      Visit Diagnosis: Chronic diastolic congestive heart failure ([place])  Patient's Home Medications on Admission:  Current Outpatient Prescriptions:  .  aspirin 81 MG tablet, Take 81 mg by mouth daily., Disp: , Rfl:  .  cloNIDine (CATAPRES - DOSED IN MG/24 HR) 0.3 mg/24hr patch, Place 0.3 mg onto the skin once a week. , Disp: , Rfl:  .  clopidogrel (PLAVIX) 75 MG tablet, Take 1 tablet (75 mg total) by mouth daily., Disp: 90 tablet, Rfl: 3 .  doxazosin (CARDURA) 8 MG tablet, Take 1 tablet (8 mg total) by mouth 2 (two) times daily., Disp: 180 tablet, Rfl: 3 .  famotidine (PEPCID) 20 MG tablet, Take 1 tablet (20 mg total) by mouth 2 (two) times daily., Disp: 180 tablet, Rfl: 1 .  fluticasone (FLONASE) 50 MCG/ACT nasal spray, Place into the nose., Disp: , Rfl:  .  furosemide (LASIX) 20 MG tablet, Take 1 tablet (20 mg total) by mouth 2 (two) times daily as needed., Disp: 180 tablet, Rfl: 3 .  glimepiride (AMARYL) 4 MG tablet, Take 4 mg by mouth daily before breakfast., Disp: , Rfl:  .  ipratropium-albuterol (DUONEB) 0.5-2.5 (3) MG/3ML SOLN, Take 3 mLs by nebulization every 4 (four) hours as needed (shortness of breath/wheezing.)., Disp: 360 mL, Rfl:  .  levothyroxine (SYNTHROID, LEVOTHROID) 125 MCG tablet, Take 125 mcg by mouth daily., Disp: , Rfl:  .  losartan-hydrochlorothiazide (HYZAAR) 100-25 MG per tablet, Take 1 tablet by mouth daily.  , Disp: , Rfl:  .  metFORMIN (GLUCOPHAGE) 1000 MG tablet, Take 1,000 mg by mouth 2 (two) times daily with a meal., Disp: , Rfl:  .  Multiple  Vitamins-Minerals (PRESERVISION AREDS PO), Take by mouth 2 (two) times daily., Disp: , Rfl:  .  oxybutynin (DITROPAN) 5 MG tablet, Take 5 mg by mouth 1 dose over 46 hours.  , Disp: , Rfl:  .  pantoprazole (PROTONIX) 40 MG tablet, Take 40 mg by mouth daily. , Disp: , Rfl:  .  senna (SENOKOT) 8.6 MG TABS tablet, Take 1 tablet (8.6 mg total) by mouth daily as needed for mild constipation., Disp: 120 each, Rfl: 0 .  simvastatin (ZOCOR) 40 MG tablet, Take 1 tablet (40 mg total) by mouth at bedtime. (Patient taking differently: Take 40 mg by mouth daily. ), Disp: 90 tablet, Rfl: 3 .  SYMBICORT 160-4.5 MCG/ACT inhaler, Inhale 2 puffs into the lungs 2 (two) times daily. , Disp: , Rfl:   Past Medical History: Past Medical History:  Diagnosis Date  . Bladder incontinence   . CAD (coronary artery disease)    a. cardiac cath 05/2009: proximal LAD 90% stenosis s/p PCI/Xience 2.75 x 12 mm DES, mid LAD 40%, diagonal 40%, proximal LCx 40% followed by 40% LCx lesion, 30%-40%-30% lesion noted in the RCA with distal RCA 30% and 25% lesions  . Chronic diastolic (congestive) heart failure    a. Lexiscan 2013: no ischemia, normal EF b. echo 09/2015: EF 55-60% w/ Grade 1 DD  . CKD (chronic kidney disease) stage 3, GFR 30-59 ml/min   . Degenerative arthritis  of knee    bilateral knees  . Diabetes mellitus    Type II  . Hiatal hernia   . Hypertension   . Iron deficiency   . Menopausal symptoms   . Morbid obesity ([place])   . Thyroid disease    hypothyroidism    Tobacco Use: History  Smoking Status  . Never Smoker  Smokeless Tobacco  . Never Used    Labs: Recent Review Flowsheet Data    Labs for ITP Cardiac and Pulmonary Rehab Latest Ref Rng & Units 05/07/2009 10/18/2009 06/25/2010 10/15/2012 10/01/2015   Cholestrol 0 - 200 mg/dL 148 147 135 - -   LDLCALC 0 - 99 mg/dL 80 80 56 - -   HDL >39 mg/dL 29.2 40 35(L) - -   Trlycerides <150 mg/dL 194 133 220(H) - -   Hemoglobin A1c 4.0 - 6.0 % 6.3 - - 8.6(H) 5.6        ADL UCSD:     Pulmonary Assessment Scores    Row Name 03/04/16 1208         ADL UCSD   ADL Phase Entry     SOB Score total 76     Rest 0     Walk 0     Stairs 5     Bath 2     Dress 3     Shop 5        Pulmonary Function Assessment:     Pulmonary Function Assessment - 03/04/16 1207      Initial Spirometry Results   FVC% 107 %   FEV1% 130 %   FEV1/FVC Ratio 89.63     Breath   Bilateral Breath Sounds Clear   Shortness of Breath Yes;Limiting activity;Fear of Shortness of Breath      Exercise Target Goals:    Exercise Program Goal: Individual exercise prescription set with THRR, safety & activity barriers. Participant demonstrates ability to understand and report RPE using BORG scale, to self-measure pulse accurately, and to acknowledge the importance of the exercise prescription.  Exercise Prescription Goal: Starting with aerobic activity 30 plus minutes a day, 3 days per week for initial exercise prescription. Provide home exercise prescription and guidelines that participant acknowledges understanding prior to discharge.  Activity Barriers & Risk Stratification:     Activity Barriers & Cardiac Risk Stratification - 03/04/16 1206      Activity Barriers & Cardiac Risk Stratification   Activity Barriers Shortness of Breath;Deconditioning;Muscular Weakness   Cardiac Risk Stratification Moderate      6 Minute Walk:     6 Minute Walk    Row Name 03/04/16 1223         6 Minute Walk   Distance 90 feet     Walk Time 1 minutes     # of Rest Breaks 2     MPH 0.9     METS 1.7     RPE 17     Perceived Dyspnea  4     VO2 Peak 5.9     Symptoms Yes (comment)     Comments short of breath     Resting HR 89 bpm     Resting BP 122/52     Max Ex. HR 112 bpm     Max Ex. BP 152/42        Initial Exercise Prescription:     Initial Exercise Prescription - 03/04/16 1200      Date of Initial Exercise RX and Referring Provider   Date 03/04/16  Referring Provider Gollan     Treadmill   MPH 0.5   Grade 0   Minutes 15  1/1/1/1     NuStep   Level 1   Minutes 15   METs 1.5     T5 Nustep   Level 1   Minutes 15   METs 1.5     Biostep-RELP   Level 1   Minutes 15   METs 1.5     Prescription Details   Frequency (times per week) 3   Duration Progress to 30 minutes of continuous aerobic without signs/symptoms of physical distress     Intensity   THRR 40-80% of Max Heartrate 107-125   Ratings of Perceived Exertion 11-13   Perceived Dyspnea 0-4     Progression   Progression Continue to progress workloads to maintain intensity without signs/symptoms of physical distress.     Resistance Training   Training Prescription Yes   Weight 1   Reps 10-15      Perform Capillary Blood Glucose checks as needed.  Exercise Prescription Changes:     Exercise Prescription Changes    Row Name 03/10/16 1100             Response to Exercise   Blood Pressure (Admit) 164/60       Blood Pressure (Exercise) 168/58       Blood Pressure (Exit) 148/62       Heart Rate (Admit) 103 bpm       Heart Rate (Exercise) 93 bpm       Heart Rate (Exit) 88 bpm       Oxygen Saturation (Admit) 98 %       Oxygen Saturation (Exercise) 98 %       Oxygen Saturation (Exit) 97 %       Rating of Perceived Exertion (Exercise) 13       Perceived Dyspnea (Exercise) 4         Resistance Training   Training Prescription Yes       Weight 1       Reps 10-15         NuStep   Level 1       Minutes 15       METs 1.5         T5 Nustep   Level 1       Minutes 15       METs 1.5          Exercise Comments:     Exercise Comments    Row Name 03/10/16 1149           Exercise Comments First full day of exercise!  Patient was oriented to gym and equipment including functions, settings, policies, and procedures.  Patient's individual exercise prescription and treatment plan were reviewed.  All starting workloads were established based on the  results of the 6 minute walk test done at initial orientation visit.  The plan for exercise progression was also introduced and progression will be customized based on patient's performance and goals          Discharge Exercise Prescription (Final Exercise Prescription Changes):     Exercise Prescription Changes - 03/10/16 1100      Response to Exercise   Blood Pressure (Admit) 164/60   Blood Pressure (Exercise) 168/58   Blood Pressure (Exit) 148/62   Heart Rate (Admit) 103 bpm   Heart Rate (Exercise) 93 bpm   Heart Rate (Exit) 88 bpm   Oxygen Saturation (Admit)  98 %   Oxygen Saturation (Exercise) 98 %   Oxygen Saturation (Exit) 97 %   Rating of Perceived Exertion (Exercise) 13   Perceived Dyspnea (Exercise) 4     Resistance Training   Training Prescription Yes   Weight 1   Reps 10-15     NuStep   Level 1   Minutes 15   METs 1.5     T5 Nustep   Level 1   Minutes 15   METs 1.5       Nutrition:  Target Goals: Understanding of nutrition guidelines, daily intake of sodium '1500mg'$ , cholesterol '200mg'$ , calories 30% from fat and 7% or less from saturated fats, daily to have 5 or more servings of fruits and vegetables.  Biometrics:     Pre Biometrics - 03/10/16 1143      Pre Biometrics   Waist Circumference 45 inches   Hip Circumference 56.5 inches   Waist to Hip Ratio 0.8 %       Nutrition Therapy Plan and Nutrition Goals:   Nutrition Discharge: Rate Your Plate Scores:   Psychosocial: Target Goals: Acknowledge presence or absence of depression, maximize coping skills, provide positive support system. Participant is able to verbalize types and ability to use techniques and skills needed for reducing stress and depression.  Initial Review & Psychosocial Screening:     Initial Psych Review & Screening - 03/04/16 [place]? Yes   Comments Ms Chura has good support from her daughter and son, although the son who  lives in [place] helps the most with errands and household chores. She has sadness over the death of her one daughter who died of a stroke. Ms Noller is looking forward to walking more and being nore active.     Barriers   Psychosocial barriers to participate in program The patient should benefit from training in stress management and relaxation.     Screening Interventions   Interventions Encouraged to exercise;Program counselor consult      Quality of Life Scores:     Quality of Life - 03/04/16 1218      Quality of Life Scores   Health/Function Pre 21 %   Socioeconomic Pre 21 %   Psych/Spiritual Pre 21 %   Family Pre 21 %   GLOBAL Pre 21 %      PHQ-9: Recent Review Flowsheet Data    Depression screen [facility] 2/9 03/04/2016   Decreased Interest 3   Down, Depressed, Hopeless 2   PHQ - 2 Score 5   Altered sleeping 2   Tired, decreased energy 3   Change in appetite 2   Feeling bad or failure about yourself  1   Trouble concentrating 1   Moving slowly or fidgety/restless 0   Suicidal thoughts 0   PHQ-9 Score 14   Difficult doing work/chores Somewhat difficult      Psychosocial Evaluation and Intervention:   Psychosocial Re-Evaluation:  Education: Education Goals: Education classes will be provided on a weekly basis, covering required topics. Participant will state understanding/return demonstration of topics presented.  Learning Barriers/Preferences:     Learning Barriers/Preferences - 03/04/16 1206      Learning Barriers/Preferences   Learning Barriers None   Learning Preferences None      Education Topics: Initial Evaluation Education: - Verbal, written and demonstration of respiratory meds, RPE/PD scales, oximetry and breathing techniques. Instruction on use of nebulizers and MDIs: cleaning and proper use, rinsing mouth  with steroid doses and importance of monitoring MDI activations. Flowsheet Row Pulmonary Rehab from 03/10/2016 in [facility] Cardiac and  Pulmonary Rehab  Date  03/04/16  Educator  LB  Instruction Review Code  2- meets goals/outcomes      General Nutrition Guidelines/Fats and Fiber: -Group instruction provided by verbal, written material, models and posters to present the general guidelines for heart healthy nutrition. Gives an explanation and review of dietary fats and fiber.   Controlling Sodium/Reading Food Labels: -Group verbal and written material supporting the discussion of sodium use in heart healthy nutrition. Review and explanation with models, verbal and written materials for utilization of the food label.   Exercise Physiology & Risk Factors: - Group verbal and written instruction with models to review the exercise physiology of the cardiovascular system and associated critical values. Details cardiovascular disease risk factors and the goals associated with each risk factor.   Aerobic Exercise & Resistance Training: - Gives group verbal and written discussion on the health impact of inactivity. On the components of aerobic and resistive training programs and the benefits of this training and how to safely progress through these programs.   Flexibility, Balance, General Exercise Guidelines: - Provides group verbal and written instruction on the benefits of flexibility and balance training programs. Provides general exercise guidelines with specific guidelines to those with heart or lung disease. Demonstration and skill practice provided.   Stress Management: - Provides group verbal and written instruction about the health risks of elevated stress, cause of high stress, and healthy ways to reduce stress.   Depression: - Provides group verbal and written instruction on the correlation between heart/lung disease and depressed mood, treatment options, and the stigmas associated with seeking treatment.   Exercise & Equipment Safety: - Individual verbal instruction and demonstration of equipment use and safety  with use of the equipment. Flowsheet Row Pulmonary Rehab from 03/10/2016 in [facility] Cardiac and Pulmonary Rehab  Date  03/10/16  Educator  AS  Instruction Review Code  2- meets goals/outcomes      Infection Prevention: - Provides verbal and written material to individual with discussion of infection control including proper hand washing and proper equipment cleaning during exercise session. Flowsheet Row Pulmonary Rehab from 03/10/2016 in [facility] Cardiac and Pulmonary Rehab  Date  03/10/16  Educator  AS  Instruction Review Code  2- meets goals/outcomes      Falls Prevention: - Provides verbal and written material to individual with discussion of falls prevention and safety. Flowsheet Row Pulmonary Rehab from 03/10/2016 in [facility] Cardiac and Pulmonary Rehab  Date  03/04/16  Educator  LB  Instruction Review Code  2- meets goals/outcomes      Diabetes: - Individual verbal and written instruction to review signs/symptoms of diabetes, desired ranges of glucose level fasting, after meals and with exercise. Advice that pre and post exercise glucose checks will be done for 3 sessions at entry of program.   Chronic Lung Diseases: - Group verbal and written instruction to review new updates, new respiratory medications, new advancements in procedures and treatments. Provide informative websites and "800" numbers of self-education.   Lung Procedures: - Group verbal and written instruction to describe testing methods done to diagnose lung disease. Review the outcome of test results. Describe the treatment choices: Pulmonary Function Tests, ABGs and oximetry.   Energy Conservation: - Provide group verbal and written instruction for methods to conserve energy, plan and organize activities. Instruct on pacing techniques, use of adaptive equipment and posture/positioning to relieve  shortness of breath.   Triggers: - Group verbal and written instruction to review types of environmental controls: home  humidity, furnaces, filters, dust mite/pet prevention, HEPA vacuums. To discuss weather changes, air quality and the benefits of nasal washing.   Exacerbations: - Group verbal and written instruction to provide: warning signs, infection symptoms, calling MD promptly, preventive modes, and value of vaccinations. Review: effective airway clearance, coughing and/or vibration techniques. Create an Sports administrator.   Oxygen: - Individual and group verbal and written instruction on oxygen therapy. Includes supplement oxygen, available portable oxygen systems, continuous and intermittent flow rates, oxygen safety, concentrators, and Medicare reimbursement for oxygen.   Respiratory Medications: - Group verbal and written instruction to review medications for lung disease. Drug class, frequency, complications, importance of spacers, rinsing mouth after steroid MDI's, and proper cleaning methods for nebulizers.   AED/CPR: - Group verbal and written instruction with the use of models to demonstrate the basic use of the AED with the basic ABC's of resuscitation.   Breathing Retraining: - Provides individuals verbal and written instruction on purpose, frequency, and proper technique of diaphragmatic breathing and pursed-lipped breathing. Applies individual practice skills. Flowsheet Row Pulmonary Rehab from 03/10/2016 in [facility] Cardiac and Pulmonary Rehab  Date  03/10/16  Educator  AS  Instruction Review Code  2- meets goals/outcomes      Anatomy and Physiology of the Lungs: - Group verbal and written instruction with the use of models to provide basic lung anatomy and physiology related to function, structure and complications of lung disease.   Heart Failure: - Group verbal and written instruction on the basics of heart failure: signs/symptoms, treatments, explanation of ejection fraction, enlarged heart and cardiomyopathy.   Sleep Apnea: - Individual verbal and written instruction to review  Obstructive Sleep Apnea. Review of risk factors, methods for diagnosing and types of masks and machines for OSA.   Anxiety: - Provides group, verbal and written instruction on the correlation between heart/lung disease and anxiety, treatment options, and management of anxiety.   Relaxation: - Provides group, verbal and written instruction about the benefits of relaxation for patients with heart/lung disease. Also provides patients with examples of relaxation techniques.   Knowledge Questionnaire Score:     Knowledge Questionnaire Score - 03/04/16 1206      Knowledge Questionnaire Score   Pre Score 6/10       Core Components/Risk Factors/Patient Goals at Admission:     Personal Goals and Risk Factors at Admission - 03/04/16 1212      Core Components/Risk Factors/Patient Goals on Admission   Sedentary Yes  Goal: improve walking   Intervention Provide advice, education, support and counseling about physical activity/exercise needs.;Develop an individualized exercise prescription for aerobic and resistive training based on initial evaluation findings, risk stratification, comorbidities and participant's personal goals.   Expected Outcomes Achievement of increased cardiorespiratory fitness and enhanced flexibility, muscular endurance and strength shown through measurements of functional capacity and personal statement of participant.   Increase Strength and Stamina Yes   Intervention Provide advice, education, support and counseling about physical activity/exercise needs.;Develop an individualized exercise prescription for aerobic and resistive training based on initial evaluation findings, risk stratification, comorbidities and participant's personal goals.   Expected Outcomes Achievement of increased cardiorespiratory fitness and enhanced flexibility, muscular endurance and strength shown through measurements of functional capacity and personal statement of participant.   Improve  shortness of breath with ADL's Yes   Intervention Provide education, individualized exercise plan and daily activity instruction to help  decrease symptoms of SOB with activities of daily living.   Expected Outcomes Short Term: Achieves a reduction of symptoms when performing activities of daily living.   Develop more efficient breathing techniques such as purse lipped breathing and diaphragmatic breathing; and practicing self-pacing with activity Yes   Intervention Provide education, demonstration and support about specific breathing techniuqes utilized for more efficient breathing. Include techniques such as pursed lipped breathing, diaphragmatic breathing and self-pacing activity.   Expected Outcomes Short Term: Participant will be able to demonstrate and use breathing techniques as needed throughout daily activities.   Increase knowledge of respiratory medications and ability to use respiratory devices properly  Yes   Intervention Provide education and demonstration as needed of appropriate use of medications, inhalers, and oxygen therapy.  Duoneb SVN, Symbicort, Flutter Valve, Incentive Spirometer   Expected Outcomes Short Term: Achieves understanding of medications use. Understands that oxygen is a medication prescribed by physician. Demonstrates appropriate use of inhaler and oxygen therapy.   Diabetes Yes   Intervention Provide education about signs/symptoms and action to take for hypo/hyperglycemia.;Provide education about proper nutrition, including hydration, and aerobic/resistive exercise prescription along with prescribed medications to achieve blood glucose in normal ranges: Fasting glucose 65-99 mg/dL   Expected Outcomes Short Term: Participant verbalizes understanding of the signs/symptoms and immediate care of hyper/hypoglycemia, proper foot care and importance of medication, aerobic/resistive exercise and nutrition plan for blood glucose control.;Long Term: Attainment of HbA1C < 7%.    Heart Failure Yes   Intervention Provide a combined exercise and nutrition program that is supplemented with education, support and counseling about heart failure. Directed toward relieving symptoms such as shortness of breath, decreased exercise tolerance, and extremity edema.   Expected Outcomes Improve functional capacity of life;Short term: Attendance in program 2-3 days a week with increased exercise capacity. Reported lower sodium intake. Reported increased fruit and vegetable intake. Reports medication compliance.;Short term: Daily weights obtained and reported for increase. Utilizing diuretic protocols set by physician.;Long term: Adoption of self-care skills and reduction of barriers for early signs and symptoms recognition and intervention leading to self-care maintenance.   Hypertension Yes   Intervention Provide education on lifestyle modifcations including regular physical activity/exercise, weight management, moderate sodium restriction and increased consumption of fresh fruit, vegetables, and low fat dairy, alcohol moderation, and smoking cessation.;Monitor prescription use compliance.   Expected Outcomes Short Term: Continued assessment and intervention until BP is < 140/105m HG in hypertensive participants. < 130/865mHG in hypertensive participants with diabetes, heart failure or chronic kidney disease.;Long Term: Maintenance of blood pressure at goal levels.   Lipids Yes   Intervention Provide education and support for participant on nutrition & aerobic/resistive exercise along with prescribed medications to achieve LDL '70mg'$ , HDL >'40mg'$ .   Expected Outcomes Short Term: Participant states understanding of desired cholesterol values and is compliant with medications prescribed. Participant is following exercise prescription and nutrition guidelines.;Long Term: Cholesterol controlled with medications as prescribed, with individualized exercise RX and with personalized nutrition plan. Value  goals: LDL < '70mg'$ , HDL > 40 mg.      Core Components/Risk Factors/Patient Goals Review:      Goals and Risk Factor Review    Row Name 03/10/16 1143             Core Components/Risk Factors/Patient Goals Review   Personal Goals Review Develop more efficient breathing techniques such as purse lipped breathing and diaphragmatic breathing and practicing self-pacing with activity.       Review Pursed lip breathing was reviewed  with the patient. She demonstrated understanding of this breathing technique.        Expected Outcomes Patient will use PLB to with exercise and ADL's to help control SOB and improve O2 Sats.           Core Components/Risk Factors/Patient Goals at Discharge (Final Review):      Goals and Risk Factor Review - 03/10/16 1143      Core Components/Risk Factors/Patient Goals Review   Personal Goals Review Develop more efficient breathing techniques such as purse lipped breathing and diaphragmatic breathing and practicing self-pacing with activity.   Review Pursed lip breathing was reviewed with the patient. She demonstrated understanding of this breathing technique.    Expected Outcomes Patient will use PLB to with exercise and ADL's to help control SOB and improve O2 Sats.       ITP Comments:   Comments: 30 Day Review and patient first day of class

## 2016-03-10 NOTE — Progress Notes (Signed)
Daily Session Note  Patient Details  Name: Amy [name] MRN: [id number] Date of Birth: Oct 19, 1927 Referring Provider:   Flowsheet Row Pulmonary Rehab from 03/04/2016 in [facility] Cardiac and Pulmonary Rehab  Referring Provider  Gollan      Encounter Date: 03/10/2016  Check In:     Session Check In - 03/10/16 1142      Check-In   Location [facility]-Cardiac & Pulmonary Rehab   Staff Present Carson Myrtle, BS, RRT, Respiratory Therapist;Demetri Goshert Amedeo Plenty, BS, ACSM CEP, Exercise Physiologist;Amanda Oletta Darter, BA, ACSM CEP, Exercise Physiologist   Supervising physician immediately available to respond to emergencies [facility] immediately available ER MD   Physician(s) Drs. McShane and Kinner   Medication changes reported     No   Fall or balance concerns reported    No   Warm-up and Cool-down Performed on first and last piece of equipment   Resistance Training Performed Yes   VAD Patient? No     Pain Assessment   Currently in Pain? No/denies   Multiple Pain Sites No           Exercise Prescription Changes - 03/10/16 1100      Response to Exercise   Blood Pressure (Admit) 164/60   Blood Pressure (Exercise) 168/58   Blood Pressure (Exit) 148/62   Heart Rate (Admit) 103 bpm   Heart Rate (Exercise) 93 bpm   Heart Rate (Exit) 88 bpm   Oxygen Saturation (Admit) 98 %   Oxygen Saturation (Exercise) 98 %   Oxygen Saturation (Exit) 97 %   Rating of Perceived Exertion (Exercise) 13   Perceived Dyspnea (Exercise) 4     Resistance Training   Training Prescription Yes   Weight 1   Reps 10-15     NuStep   Level 1   Minutes 15   METs 1.5     T5 Nustep   Level 1   Minutes 15   METs 1.5      Goals Met:  Proper associated with RPD/PD & O2 Sat Exercise tolerated well Personal goals reviewed Strength training completed today  Goals Unmet:  Not Applicable  Comments: First full day of exercise!  Patient was oriented to gym and equipment including functions, settings, policies, and  procedures.  Patient's individual exercise prescription and treatment plan were reviewed.  All starting workloads were established based on the results of the 6 minute walk test done at initial orientation visit.  The plan for exercise progression was also introduced and progression will be customized based on patient's performance and goals.    Dr. Emily Filbert is Medical Director for [place] and [facility].

## 2016-03-17 ENCOUNTER — Encounter: Payer: Medicare Other | Admitting: *Deleted

## 2016-03-17 DIAGNOSIS — I509 Heart failure, unspecified: Secondary | ICD-10-CM | POA: Diagnosis not present

## 2016-03-17 DIAGNOSIS — I5032 Chronic diastolic (congestive) heart failure: Secondary | ICD-10-CM

## 2016-03-17 LAB — GLUCOSE, CAPILLARY
GLUCOSE-CAPILLARY: 178 mg/dL — AB (ref 65–99)
Glucose-Capillary: 163 mg/dL — ABNORMAL HIGH (ref 65–99)

## 2016-03-17 NOTE — Progress Notes (Signed)
Daily Session Note  Patient Details  Name: Amy [name] MRN: [id number] Date of Birth: 07-Jul-1927 Referring Provider:   Flowsheet Row Pulmonary Rehab from 03/04/2016 in [facility] Cardiac and Pulmonary Rehab  Referring Provider  Gollan      Encounter Date: 03/17/2016  Check In:     Session Check In - 03/17/16 1153      Check-In   Location [facility]-Cardiac & Pulmonary Rehab   Staff Present Nada Maclachlan, BA, ACSM CEP, Exercise Physiologist;Laureen Owens Shark, BS, RRT, Respiratory Bertis Ruddy, BS, ACSM CEP, Exercise Physiologist   Supervising physician immediately available to respond to emergencies [facility] immediately available ER MD   Physician(s) Drs. Mariea Clonts and [place]   Medication changes reported     No   Fall or balance concerns reported    No   Warm-up and Cool-down Performed on first and last piece of equipment   Resistance Training Performed Yes   VAD Patient? No     Pain Assessment   Currently in Pain? No/denies   Multiple Pain Sites No         Goals Met:  Proper associated with RPD/PD & O2 Sat Independence with exercise equipment Exercise tolerated well Strength training completed today  Goals Unmet:  Not Applicable  Comments: Pt able to follow exercise prescription today without complaint.  Will continue to monitor for progression.    Dr. Emily Filbert is Medical Director for [place] and [facility].

## 2016-03-19 DIAGNOSIS — I5032 Chronic diastolic (congestive) heart failure: Secondary | ICD-10-CM

## 2016-03-19 DIAGNOSIS — I509 Heart failure, unspecified: Secondary | ICD-10-CM | POA: Diagnosis not present

## 2016-03-19 LAB — GLUCOSE, CAPILLARY
Glucose-Capillary: 220 mg/dL — ABNORMAL HIGH (ref 65–99)
Glucose-Capillary: 182 mg/dL — ABNORMAL HIGH (ref 65–99)

## 2016-03-19 NOTE — Progress Notes (Signed)
Daily Session Note  Patient Details  Name: CLINT STRUPP MRN: [id number] Date of Birth: Feb 05, 1928 Referring Provider:   Flowsheet Row Pulmonary Rehab from 03/04/2016 in [facility] Cardiac and Pulmonary Rehab  Referring Provider  Gollan      Encounter Date: 03/19/2016  Check In:     Session Check In - 03/19/16 1225      Check-In   Location [facility]-Cardiac & Pulmonary Rehab   Staff Present Alberteen Sam, MA, ACSM RCEP, Exercise Physiologist;Amanda Oletta Darter, BA, ACSM CEP, Exercise Physiologist;Laureen Owens Shark, BS, RRT, Respiratory Therapist   Supervising physician immediately available to respond to emergencies [facility] immediately available ER MD   Physician(s) Cinda Quest and Lord   Medication changes reported     No   Fall or balance concerns reported    No   Warm-up and Cool-down Performed as group-led Location manager Performed Yes   VAD Patient? No     Pain Assessment   Currently in Pain? No/denies         Goals Met:  Proper associated with RPD/PD & O2 Sat Independence with exercise equipment Exercise tolerated well Strength training completed today  Goals Unmet:  Not Applicable  Comments: Pt able to follow exercise prescription today without complaint.  Will continue to monitor for progression.    Dr. Emily Filbert is Medical Director for [place] and [facility].

## 2016-03-21 ENCOUNTER — Encounter: Payer: Medicare Other | Admitting: *Deleted

## 2016-03-21 DIAGNOSIS — I509 Heart failure, unspecified: Secondary | ICD-10-CM | POA: Diagnosis not present

## 2016-03-21 DIAGNOSIS — I5032 Chronic diastolic (congestive) heart failure: Secondary | ICD-10-CM

## 2016-03-21 LAB — GLUCOSE, CAPILLARY: Glucose-Capillary: 206 mg/dL — ABNORMAL HIGH (ref 65–99)

## 2016-03-21 NOTE — Progress Notes (Signed)
Daily Session Note  Patient Details  Name: Amy [name] MRN: [id number] Date of Birth: Oct 14, 1927 Referring Provider:   Flowsheet Row Pulmonary Rehab from 03/04/2016 in [facility] Cardiac and Pulmonary Rehab  Referring Provider  Gollan      Encounter Date: 03/21/2016  Check In:     Session Check In - 03/21/16 1042      Check-In   Location [facility]-Cardiac & Pulmonary Rehab   Staff Present Gerlene Burdock, RN, Vickki Hearing, BA, ACSM CEP, Exercise Physiologist;Orman Matsumura RN BSN   Supervising physician immediately available to respond to emergencies [facility] immediately available ER MD   Physician(s) Drs. Quentin Cornwall and Paduchowski   Medication changes reported     No   Fall or balance concerns reported    No   Warm-up and Cool-down Performed as group-led Location manager Performed Yes   VAD Patient? No     Pain Assessment   Currently in Pain? No/denies   Multiple Pain Sites No         Goals Met:  Proper associated with RPD/PD & O2 Sat Independence with exercise equipment Using PLB without cueing & demonstrates good technique Exercise tolerated well No report of cardiac concerns or symptoms Strength training completed today  Goals Unmet:  Not Applicable  Comments: Pt able to follow exercise prescription today without complaint.  Will continue to monitor for progression.    Dr. Emily Filbert is Medical Director for [place] and [facility].

## 2016-03-24 DIAGNOSIS — Z0000 Encounter for general adult medical examination without abnormal findings: Secondary | ICD-10-CM | POA: Diagnosis not present

## 2016-03-24 DIAGNOSIS — Z6841 Body Mass Index (BMI) 40.0 and over, adult: Secondary | ICD-10-CM | POA: Diagnosis not present

## 2016-03-24 DIAGNOSIS — E079 Disorder of thyroid, unspecified: Secondary | ICD-10-CM | POA: Diagnosis not present

## 2016-03-24 DIAGNOSIS — I10 Essential (primary) hypertension: Secondary | ICD-10-CM | POA: Diagnosis not present

## 2016-03-24 DIAGNOSIS — I25810 Atherosclerosis of coronary artery bypass graft(s) without angina pectoris: Secondary | ICD-10-CM | POA: Diagnosis not present

## 2016-03-24 DIAGNOSIS — E1165 Type 2 diabetes mellitus with hyperglycemia: Secondary | ICD-10-CM | POA: Diagnosis not present

## 2016-03-24 DIAGNOSIS — E7801 Familial hypercholesterolemia: Secondary | ICD-10-CM | POA: Diagnosis not present

## 2016-03-24 DIAGNOSIS — E6609 Other obesity due to excess calories: Secondary | ICD-10-CM | POA: Diagnosis not present

## 2016-03-24 DIAGNOSIS — E538 Deficiency of other specified B group vitamins: Secondary | ICD-10-CM | POA: Diagnosis not present

## 2016-03-26 ENCOUNTER — Encounter: Payer: Medicare Other | Admitting: *Deleted

## 2016-03-26 DIAGNOSIS — I5032 Chronic diastolic (congestive) heart failure: Secondary | ICD-10-CM

## 2016-03-26 DIAGNOSIS — I509 Heart failure, unspecified: Secondary | ICD-10-CM | POA: Diagnosis not present

## 2016-03-26 NOTE — Progress Notes (Signed)
Daily Session Note  Patient Details  Name: Amy [name] MRN: [id number] Date of Birth: 01-05-1928 Referring Provider:   Flowsheet Row Pulmonary Rehab from 03/04/2016 in [facility] Cardiac and Pulmonary Rehab  Referring Provider  Gollan      Encounter Date: 03/26/2016  Check In:     Session Check In - 03/26/16 1131      Check-In   Location [facility]-Cardiac & Pulmonary Rehab   Staff Present Carson Myrtle, BS, RRT, Respiratory Lennie Hummer, MA, ACSM RCEP, Exercise Physiologist;Amanda Oletta Darter, BA, ACSM CEP, Exercise Physiologist;Chauncey Bruno RN BSN   Supervising physician immediately available to respond to emergencies [facility] immediately available ER MD   Physician(s) Drs. Malinda and Williams   Medication changes reported     No   Fall or balance concerns reported    No   Warm-up and Cool-down Performed as group-led Location manager Performed Yes   VAD Patient? No     Pain Assessment   Multiple Pain Sites No         Goals Met:  Proper associated with RPD/PD & O2 Sat Independence with exercise equipment Using PLB without cueing & demonstrates good technique Exercise tolerated well Strength training completed today  Goals Unmet:  Not Applicable  Comments: Pt able to follow exercise prescription today without complaint.  Will continue to monitor for progression.    Dr. Emily Filbert is Medical Director for [place] and [facility].

## 2016-03-28 ENCOUNTER — Encounter: Payer: Medicare Other | Attending: Cardiovascular Disease | Admitting: *Deleted

## 2016-03-28 DIAGNOSIS — I5032 Chronic diastolic (congestive) heart failure: Secondary | ICD-10-CM

## 2016-03-28 DIAGNOSIS — I509 Heart failure, unspecified: Secondary | ICD-10-CM | POA: Insufficient documentation

## 2016-03-28 NOTE — Progress Notes (Signed)
Daily Session Note  Patient Details  Name: Amy [name] MRN: [id number] Date of Birth: 1928-02-08 Referring Provider:   Flowsheet Row Pulmonary Rehab from 03/04/2016 in [facility] Cardiac and Pulmonary Rehab  Referring Provider  Gollan      Encounter Date: 03/28/2016  Check In:     Session Check In - 03/28/16 1019      Check-In   Location [facility]-Cardiac & Pulmonary Rehab   Staff Present Gerlene Burdock, RN, Vickki Hearing, BA, ACSM CEP, Exercise Physiologist;Patricia Surles RN BSN   Supervising physician immediately available to respond to emergencies [facility] immediately available ER MD   Physician(s) Dr. Jimmye Norman and Dr. Marcelene Butte   Medication changes reported     No   Fall or balance concerns reported    No   Warm-up and Cool-down Performed on first and last piece of equipment   Resistance Training Performed Yes   VAD Patient? No     Pain Assessment   Currently in Pain? No/denies           Exercise Prescription Changes - 03/28/16 0900      Response to Exercise   Blood Pressure (Admit) 124/60   Blood Pressure (Exercise) 126/64   Blood Pressure (Exit) 134/80   Heart Rate (Admit) 95 bpm   Heart Rate (Exercise) 89 bpm   Heart Rate (Exit) 91 bpm   Oxygen Saturation (Admit) 97 %   Oxygen Saturation (Exercise) 95 %   Oxygen Saturation (Exit) 96 %   Rating of Perceived Exertion (Exercise) 15   Perceived Dyspnea (Exercise) 3   Duration Progress to 45 minutes of aerobic exercise without signs/symptoms of physical distress   Intensity THRR unchanged     Progression   Progression Continue to progress workloads to maintain intensity without signs/symptoms of physical distress.     Resistance Training   Training Prescription Yes   Weight 1   Reps 10-15     NuStep   Level 3   Minutes 15   METs 1.2     T5 Nustep   Minutes 15   METs 1.7      Goals Met:  Proper associated with RPD/PD & O2 Sat Exercise tolerated well  Goals Unmet:  Not Applicable  Comments:      Dr. Emily Filbert is Medical Director for [place] and [facility].

## 2016-03-31 DIAGNOSIS — I5032 Chronic diastolic (congestive) heart failure: Secondary | ICD-10-CM

## 2016-03-31 DIAGNOSIS — I509 Heart failure, unspecified: Secondary | ICD-10-CM | POA: Diagnosis not present

## 2016-03-31 NOTE — Progress Notes (Signed)
Daily Session Note  Patient Details  Name: Amy [name] MRN: [id number] Date of Birth: [birth year] Referring Provider:   Flowsheet Row Pulmonary Rehab from 03/04/2016 in [facility] Cardiac and Pulmonary Rehab  Referring Provider  Gollan      Encounter Date: 03/31/2016  Check In:     Session Check In - 03/31/16 1222      Check-In   Location [facility]-Cardiac & Pulmonary Rehab   Staff Present Carson Myrtle, BS, RRT, Respiratory Therapist;Kelly Amedeo Plenty, BS, ACSM CEP, Exercise Physiologist;Amanda Oletta Darter, BA, ACSM CEP, Exercise Physiologist   Supervising physician immediately available to respond to emergencies [facility] immediately available ER MD   Physician(s) Jimmye Norman and Corky Downs   Medication changes reported     No   Warm-up and Cool-down Performed as group-led Location manager Performed Yes   VAD Patient? No     Pain Assessment   Currently in Pain? No/denies   Multiple Pain Sites No         Goals Met:  Proper associated with RPD/PD & O2 Sat Independence with exercise equipment Exercise tolerated well Strength training completed today  Goals Unmet:  Not Applicable  Comments: Pt able to follow exercise prescription today without complaint.  Will continue to monitor for progression.    Dr. Emily Filbert is Medical Director for [place] and [facility].

## 2016-04-02 ENCOUNTER — Encounter: Payer: Medicare Other | Admitting: *Deleted

## 2016-04-02 DIAGNOSIS — I5032 Chronic diastolic (congestive) heart failure: Secondary | ICD-10-CM

## 2016-04-02 DIAGNOSIS — I509 Heart failure, unspecified: Secondary | ICD-10-CM | POA: Diagnosis not present

## 2016-04-02 NOTE — Progress Notes (Signed)
Daily Session Note  Patient Details  Name: Amy [name] MRN: [id number] Date of Birth: 19-Nov-1927 Referring Provider:   Flowsheet Row Pulmonary Rehab from 03/04/2016 in [facility] Cardiac and Pulmonary Rehab  Referring Provider  Gollan      Encounter Date: 04/02/2016  Check In:     Session Check In - 04/02/16 1008      Check-In   Location [facility]-Cardiac & Pulmonary Rehab   Staff Present Alberteen Sam, MA, ACSM RCEP, Exercise Physiologist;Laureen Owens Shark, BS, RRT, Respiratory Dareen Piano, BA, ACSM CEP, Exercise Physiologist   Supervising physician immediately available to respond to emergencies [facility] immediately available ER MD   Physician(s) Drs. Malinda and Robinson   Medication changes reported     No   Fall or balance concerns reported    No   Warm-up and Cool-down Performed as group-led Location manager Performed Yes   VAD Patient? No     Pain Assessment   Currently in Pain? No/denies   Multiple Pain Sites No         Goals Met:  Proper associated with RPD/PD & O2 Sat Exercise tolerated well Queuing for purse lip breathing Strength training completed today  Goals Unmet:  Not Applicable  Comments: Pt able to follow exercise prescription today without complaint.  Will continue to monitor for progression. Chrisandra left a few minutes early and did not stay for cool down or education.  She had had soiled herself and needed to leave.   Dr. Emily Filbert is Medical Director for [place] and [facility].

## 2016-04-04 DIAGNOSIS — H353211 Exudative age-related macular degeneration, right eye, with active choroidal neovascularization: Secondary | ICD-10-CM | POA: Diagnosis not present

## 2016-04-07 ENCOUNTER — Encounter: Payer: Self-pay | Admitting: Respiratory Therapy

## 2016-04-07 DIAGNOSIS — S0501XD Injury of conjunctiva and corneal abrasion without foreign body, right eye, subsequent encounter: Secondary | ICD-10-CM | POA: Diagnosis not present

## 2016-04-07 DIAGNOSIS — I5032 Chronic diastolic (congestive) heart failure: Secondary | ICD-10-CM

## 2016-04-07 NOTE — Progress Notes (Signed)
Pulmonary Individual Treatment Plan  Patient Details  Name: Amy [name] MRN: [id number] Date of Birth: 1927-11-18 Referring Provider:   Flowsheet Row Pulmonary Rehab from 03/04/2016 in [facility] Cardiac and Pulmonary Rehab  Referring Provider  Gollan      Initial Encounter Date:  Flowsheet Row Pulmonary Rehab from 03/04/2016 in [facility] Cardiac and Pulmonary Rehab  Date  03/04/16  Referring Provider  Rockey Situ      Visit Diagnosis: Chronic diastolic congestive heart failure ([place])  Patient's Home Medications on Admission:  Current Outpatient Prescriptions:    aspirin 81 MG tablet, Take 81 mg by mouth daily., Disp: , Rfl:    cloNIDine (CATAPRES - DOSED IN MG/24 HR) 0.3 mg/24hr patch, Place 0.3 mg onto the skin once a week. , Disp: , Rfl:    clopidogrel (PLAVIX) 75 MG tablet, Take 1 tablet (75 mg total) by mouth daily., Disp: 90 tablet, Rfl: 3   doxazosin (CARDURA) 8 MG tablet, Take 1 tablet (8 mg total) by mouth 2 (two) times daily., Disp: 180 tablet, Rfl: 3   famotidine (PEPCID) 20 MG tablet, Take 1 tablet (20 mg total) by mouth 2 (two) times daily., Disp: 180 tablet, Rfl: 1   fluticasone (FLONASE) 50 MCG/ACT nasal spray, Place into the nose., Disp: , Rfl:    glimepiride (AMARYL) 4 MG tablet, Take 4 mg by mouth daily before breakfast., Disp: , Rfl:    ipratropium-albuterol (DUONEB) 0.5-2.5 (3) MG/3ML SOLN, Take 3 mLs by nebulization every 4 (four) hours as needed (shortness of breath/wheezing.)., Disp: 360 mL, Rfl:    levothyroxine (SYNTHROID, LEVOTHROID) 125 MCG tablet, Take 125 mcg by mouth daily., Disp: , Rfl:    losartan-hydrochlorothiazide (HYZAAR) 100-25 MG per tablet, Take 1 tablet by mouth daily.  , Disp: , Rfl:    metFORMIN (GLUCOPHAGE) 1000 MG tablet, Take 1,000 mg by mouth 2 (two) times daily with a meal., Disp: , Rfl:    Multiple Vitamins-Minerals (PRESERVISION AREDS PO), Take by mouth 2 (two) times daily., Disp: , Rfl:    oxybutynin (DITROPAN) 5 MG tablet, Take 5 mg  by mouth 1 dose over 46 hours.  , Disp: , Rfl:    pantoprazole (PROTONIX) 40 MG tablet, Take 40 mg by mouth daily. , Disp: , Rfl:    senna (SENOKOT) 8.6 MG TABS tablet, Take 1 tablet (8.6 mg total) by mouth daily as needed for mild constipation., Disp: 120 each, Rfl: 0   simvastatin (ZOCOR) 40 MG tablet, Take 1 tablet (40 mg total) by mouth at bedtime. (Patient taking differently: Take 40 mg by mouth daily. ), Disp: 90 tablet, Rfl: 3   SYMBICORT 160-4.5 MCG/ACT inhaler, Inhale 2 puffs into the lungs 2 (two) times daily. , Disp: , Rfl:   Past Medical History: Past Medical History:  Diagnosis Date   Bladder incontinence    CAD (coronary artery disease)    a. cardiac cath 05/2009: proximal LAD 90% stenosis s/p PCI/Xience 2.75 x 12 mm DES, mid LAD 40%, diagonal 40%, proximal LCx 40% followed by 40% LCx lesion, 30%-40%-30% lesion noted in the RCA with distal RCA 30% and 25% lesions   Chronic diastolic (congestive) heart failure    a. Lexiscan 2013: no ischemia, normal EF b. echo 09/2015: EF 55-60% w/ Grade 1 DD   CKD (chronic kidney disease) stage 3, GFR 30-59 ml/min    Degenerative arthritis of knee    bilateral knees   Diabetes mellitus    Type II   Hiatal hernia    Hypertension  Iron deficiency    Menopausal symptoms    Morbid obesity (HCC)    Thyroid disease    hypothyroidism    Tobacco Use: History  Smoking Status   Never Smoker  Smokeless Tobacco   Never Used    Labs: Recent Review Flowsheet Data    Labs for ITP Cardiac and Pulmonary Rehab Latest Ref Rng & Units 05/07/2009 10/18/2009 06/25/2010 10/15/2012 10/01/2015   Cholestrol 0 - 200 mg/dL 148 147 135 - -   LDLCALC 0 - 99 mg/dL 80 80 56 - -   HDL >39 mg/dL 29.2 40 35(L) - -   Trlycerides <150 mg/dL 194 133 220(H) - -   Hemoglobin A1c 4.0 - 6.0 % 6.3 - - 8.6(H) 5.6       ADL UCSD:     Pulmonary Assessment Scores    Row Name 03/04/16 1208         ADL UCSD   ADL Phase Entry     SOB Score total 76      Rest 0     Walk 0     Stairs 5     Bath 2     Dress 3     Shop 5        Pulmonary Function Assessment:     Pulmonary Function Assessment - 03/04/16 1207      Initial Spirometry Results   FVC% 107 %   FEV1% 130 %   FEV1/FVC Ratio 89.63     Breath   Bilateral Breath Sounds Clear   Shortness of Breath Yes;Limiting activity;Fear of Shortness of Breath      Exercise Target Goals:    Exercise Program Goal: Individual exercise prescription set with THRR, safety & activity barriers. Participant demonstrates ability to understand and report RPE using BORG scale, to self-measure pulse accurately, and to acknowledge the importance of the exercise prescription.  Exercise Prescription Goal: Starting with aerobic activity 30 plus minutes a day, 3 days per week for initial exercise prescription. Provide home exercise prescription and guidelines that participant acknowledges understanding prior to discharge.  Activity Barriers & Risk Stratification:     Activity Barriers & Cardiac Risk Stratification - 03/04/16 1206      Activity Barriers & Cardiac Risk Stratification   Activity Barriers Shortness of Breath;Deconditioning;Muscular Weakness   Cardiac Risk Stratification Moderate      6 Minute Walk:     6 Minute Walk    Row Name 03/04/16 1223         6 Minute Walk   Distance 90 feet     Walk Time 1 minutes     # of Rest Breaks 2     MPH 0.9     METS 1.7     RPE 17     Perceived Dyspnea  4     VO2 Peak 5.9     Symptoms Yes (comment)     Comments short of breath     Resting HR 89 bpm     Resting BP 122/52     Max Ex. HR 112 bpm     Max Ex. BP 152/42        Initial Exercise Prescription:     Initial Exercise Prescription - 03/04/16 1200      Date of Initial Exercise RX and Referring Provider   Date 03/04/16   Referring Provider Gollan     Treadmill   MPH 0.5   Grade 0   Minutes 15  1/1/1/1  NuStep   Level 1   Minutes 15   METs 1.5     T5  Nustep   Level 1   Minutes 15   METs 1.5     Biostep-RELP   Level 1   Minutes 15   METs 1.5     Prescription Details   Frequency (times per week) 3   Duration Progress to 30 minutes of continuous aerobic without signs/symptoms of physical distress     Intensity   THRR 40-80% of Max Heartrate 107-125   Ratings of Perceived Exertion 11-13   Perceived Dyspnea 0-4     Progression   Progression Continue to progress workloads to maintain intensity without signs/symptoms of physical distress.     Resistance Training   Training Prescription Yes   Weight 1   Reps 10-15      Perform Capillary Blood Glucose checks as needed.  Exercise Prescription Changes:     Exercise Prescription Changes    Row Name 03/10/16 1100 03/28/16 0900           Response to Exercise   Blood Pressure (Admit) 164/60 124/60      Blood Pressure (Exercise) 168/58 126/64      Blood Pressure (Exit) 148/62 134/80      Heart Rate (Admit) 103 bpm 95 bpm      Heart Rate (Exercise) 93 bpm 89 bpm      Heart Rate (Exit) 88 bpm 91 bpm      Oxygen Saturation (Admit) 98 % 97 %      Oxygen Saturation (Exercise) 98 % 95 %      Oxygen Saturation (Exit) 97 % 96 %      Rating of Perceived Exertion (Exercise) 13 15      Perceived Dyspnea (Exercise) 4 3      Duration  -- Progress to 45 minutes of aerobic exercise without signs/symptoms of physical distress      Intensity  -- THRR unchanged        Progression   Progression  -- Continue to progress workloads to maintain intensity without signs/symptoms of physical distress.        Resistance Training   Training Prescription Yes Yes      Weight 1 1      Reps 10-15 10-15        NuStep   Level 1 3      Minutes 15 15      METs 1.5 1.2        T5 Nustep   Level 1  --      Minutes 15 15      METs 1.5 1.7         Exercise Comments:     Exercise Comments    Row Name 03/10/16 1149 03/28/16 0952 04/02/16 1128       Exercise Comments First full day of  exercise!  Patient was oriented to gym and equipment including functions, settings, policies, and procedures.  Patient's individual exercise prescription and treatment plan were reviewed.  All starting workloads were established based on the results of the 6 minute walk test done at initial orientation visit.  The plan for exercise progression was also introduced and progression will be customized based on patient's performance and goals Amy [name] has been able to walk in one minute intervals on the TM. Amy [name] moved up to 1.3 mph and did 2 min intervals today!!        Discharge Exercise Prescription (Final Exercise Prescription Changes):  Exercise Prescription Changes - 03/28/16 0900      Response to Exercise   Blood Pressure (Admit) 124/60   Blood Pressure (Exercise) 126/64   Blood Pressure (Exit) 134/80   Heart Rate (Admit) 95 bpm   Heart Rate (Exercise) 89 bpm   Heart Rate (Exit) 91 bpm   Oxygen Saturation (Admit) 97 %   Oxygen Saturation (Exercise) 95 %   Oxygen Saturation (Exit) 96 %   Rating of Perceived Exertion (Exercise) 15   Perceived Dyspnea (Exercise) 3   Duration Progress to 45 minutes of aerobic exercise without signs/symptoms of physical distress   Intensity THRR unchanged     Progression   Progression Continue to progress workloads to maintain intensity without signs/symptoms of physical distress.     Resistance Training   Training Prescription Yes   Weight 1   Reps 10-15     NuStep   Level 3   Minutes 15   METs 1.2     T5 Nustep   Minutes 15   METs 1.7       Nutrition:  Target Goals: Understanding of nutrition guidelines, daily intake of sodium <1519m, cholesterol <2026m calories 30% from fat and 7% or less from saturated fats, daily to have 5 or more servings of fruits and vegetables.  Biometrics:     Pre Biometrics - 03/10/16 1143      Pre Biometrics   Waist Circumference 45 inches   Hip Circumference 56.5 inches   Waist to Hip Ratio 0.8 %        Nutrition Therapy Plan and Nutrition Goals:   Nutrition Discharge: Rate Your Plate Scores:   Psychosocial: Target Goals: Acknowledge presence or absence of depression, maximize coping skills, provide positive support system. Participant is able to verbalize types and ability to use techniques and skills needed for reducing stress and depression.  Initial Review & Psychosocial Screening:     Initial Psych Review & Screening - 03/04/16 12LennonYes   Comments Amy [name] good support from her daughter and son, [place] and [place]. She has sadness over the death of her one daughter who died of a stroke. Amy [name] looking forward to walking more and being nore active.     Barriers   Psychosocial barriers to participate in program The patient should benefit from training in stress management and relaxation.     Screening Interventions   Interventions Encouraged to exercise;Program counselor consult      Quality of Life Scores:     Quality of Life - 03/04/16 1218      Quality of Life Scores   Health/Function Pre 21 %   Socioeconomic Pre 21 %   Psych/Spiritual Pre 21 %   Family Pre 21 %   GLOBAL Pre 21 %      PHQ-9: Recent Review Flowsheet Data    [facility]   Decreased Interest 3   Down, Depressed, Hopeless 2   PHQ - 2 Score 5   Altered sleeping 2   Tired, decreased energy 3   Change in appetite 2   Feeling bad or failure about yourself  1   Trouble concentrating 1   Moving slowly or fidgety/restless 0   Suicidal thoughts 0   PHQ-9 Score 14   Difficult doing work/chores Somewhat difficult      Psychosocial Evaluation and Intervention:  Psychosocial Evaluation - 03/26/16 1044      Psychosocial Evaluation & Interventions   Comments Counselor met with Amy [name] today for initial psychosocial evaluation.   She just turned 83 years old last week and has been diagnosed with Congestive Heart Failure.  Amy. [name] lives alone but has several adult children who check on her frequently and she also is somewhat involved in her local church.  Amy [name] states her sleep in interrupted often with bathroom visits, etc, but that she typically gets around 6 hours/night total.  Her appetite is good also.  She denies a history of depression and anxiety but has some current symptoms since the loss of her 33 year old daughter several years ago due to a stroke.  This daughter was the primary caregiver for Amy [name] so this has had a tremendous impact on her.  She states her mood is stable and impacted by her own health and daughters death.  Her stress is her health and her limited ability to do things on her own.  She has goals to walk better and breathe better; as well as improved sleep.  Counselor encouraged Amy [name] to consider either individual counseling or a grief support group; but she declined and stated this is not something she is interested in doing.  Counselor and staff will monitor Amy [name]'s progress on her goals throughout the course of this program.  She will benefit from the depression and stress management psychoeducational components of this program.     Continued Psychosocial Services Needed Yes  Monitor mood and sleep.      Psychosocial Re-Evaluation:  Education: Education Goals: Education classes will be provided on a weekly basis, covering required topics. Participant will state understanding/return demonstration of topics presented.  Learning Barriers/Preferences:     Learning Barriers/Preferences - 03/04/16 1206      Learning Barriers/Preferences   Learning Barriers None   Learning Preferences None      Education Topics: Initial Evaluation Education: - Verbal, written and demonstration of respiratory meds, RPE/PD scales, oximetry and breathing techniques.  Instruction on use of nebulizers and MDIs: cleaning and proper use, rinsing mouth with steroid doses and importance of monitoring MDI activations. Flowsheet Row Pulmonary Rehab from 03/28/2016 in [facility] Cardiac and Pulmonary Rehab  Date  03/04/16  Educator  LB  Instruction Review Code  2- meets goals/outcomes      General Nutrition Guidelines/Fats and Fiber: -Group instruction provided by verbal, written material, models and posters to present the general guidelines for heart healthy nutrition. Gives an explanation and review of dietary fats and fiber.   Controlling Sodium/Reading Food Labels: -Group verbal and written material supporting the discussion of sodium use in heart healthy nutrition. Review and explanation with models, verbal and written materials for utilization of the food label.   Exercise Physiology & Risk Factors: - Group verbal and written instruction with models to review the exercise physiology of the cardiovascular system and associated critical values. Details cardiovascular disease risk factors and the goals associated with each risk factor.   Aerobic Exercise & Resistance Training: - Gives group verbal and written discussion on the health impact of inactivity. On the components of aerobic and resistive training programs and the benefits of this training and how to safely progress through these programs. Flowsheet Row Pulmonary Rehab from 03/28/2016 in [facility] Cardiac and Pulmonary Rehab  Date  03/26/16  Educator  [facility]  Instruction Review Code  2- meets goals/outcomes      Flexibility, Balance, General  Exercise Guidelines: - Provides group verbal and written instruction on the benefits of flexibility and balance training programs. Provides general exercise guidelines with specific guidelines to those with heart or lung disease. Demonstration and skill practice provided.   Stress Management: - Provides group verbal and written instruction about the health risks of elevated  stress, cause of high stress, and healthy ways to reduce stress.   Depression: - Provides group verbal and written instruction on the correlation between heart/lung disease and depressed mood, treatment options, and the stigmas associated with seeking treatment.   Exercise & Equipment Safety: - Individual verbal instruction and demonstration of equipment use and safety with use of the equipment. Flowsheet Row Pulmonary Rehab from 03/28/2016 in [facility] Cardiac and Pulmonary Rehab  Date  03/10/16  Educator  AS  Instruction Review Code  2- meets goals/outcomes      Infection Prevention: - Provides verbal and written material to individual with discussion of infection control including proper hand washing and proper equipment cleaning during exercise session. Flowsheet Row Pulmonary Rehab from 03/28/2016 in [facility] Cardiac and Pulmonary Rehab  Date  03/10/16  Educator  AS  Instruction Review Code  2- meets goals/outcomes      Falls Prevention: - Provides verbal and written material to individual with discussion of falls prevention and safety. Flowsheet Row Pulmonary Rehab from 03/28/2016 in [facility] Cardiac and Pulmonary Rehab  Date  03/04/16  Educator  LB  Instruction Review Code  2- meets goals/outcomes      Diabetes: - Individual verbal and written instruction to review signs/symptoms of diabetes, desired ranges of glucose level fasting, after meals and with exercise. Advice that pre and post exercise glucose checks will be done for 3 sessions at entry of program.   Chronic Lung Diseases: - Group verbal and written instruction to review new updates, new respiratory medications, new advancements in procedures and treatments. Provide informative websites and "800" numbers of self-education.   Lung Procedures: - Group verbal and written instruction to describe testing methods done to diagnose lung disease. Review the outcome of test results. Describe the treatment choices: Pulmonary Function  Tests, ABGs and oximetry.   Energy Conservation: - Provide group verbal and written instruction for methods to conserve energy, plan and organize activities. Instruct on pacing techniques, use of adaptive equipment and posture/positioning to relieve shortness of breath.   Triggers: - Group verbal and written instruction to review types of environmental controls: home humidity, furnaces, filters, dust mite/pet prevention, HEPA vacuums. To discuss weather changes, air quality and the benefits of nasal washing.   Exacerbations: - Group verbal and written instruction to provide: warning signs, infection symptoms, calling MD promptly, preventive modes, and value of vaccinations. Review: effective airway clearance, coughing and/or vibration techniques. Create an Sports administrator.   Oxygen: - Individual and group verbal and written instruction on oxygen therapy. Includes supplement oxygen, available portable oxygen systems, continuous and intermittent flow rates, oxygen safety, concentrators, and Medicare reimbursement for oxygen.   Respiratory Medications: - Group verbal and written instruction to review medications for lung disease. Drug class, frequency, complications, importance of spacers, rinsing mouth after steroid MDI's, and proper cleaning methods for nebulizers.   AED/CPR: - Group verbal and written instruction with the use of models to demonstrate the basic use of the AED with the basic ABC's of resuscitation.   Breathing Retraining: - Provides individuals verbal and written instruction on purpose, frequency, and proper technique of diaphragmatic breathing and pursed-lipped breathing. Applies individual practice skills. Flowsheet Row Pulmonary  Rehab from 03/28/2016 in [facility] Cardiac and Pulmonary Rehab  Date  03/10/16  Educator  AS  Instruction Review Code  2- meets goals/outcomes      Anatomy and Physiology of the Lungs: - Group verbal and written instruction with the use of models  to provide basic lung anatomy and physiology related to function, structure and complications of lung disease.   Heart Failure: - Group verbal and written instruction on the basics of heart failure: signs/symptoms, treatments, explanation of ejection fraction, enlarged heart and cardiomyopathy.   Sleep Apnea: - Individual verbal and written instruction to review Obstructive Sleep Apnea. Review of risk factors, methods for diagnosing and types of masks and machines for OSA.   Anxiety: - Provides group, verbal and written instruction on the correlation between heart/lung disease and anxiety, treatment options, and management of anxiety.   Relaxation: - Provides group, verbal and written instruction about the benefits of relaxation for patients with heart/lung disease. Also provides patients with examples of relaxation techniques.   Knowledge Questionnaire Score:     Knowledge Questionnaire Score - 03/04/16 1206      Knowledge Questionnaire Score   Pre Score 6/10       Core Components/Risk Factors/Patient Goals at Admission:     Personal Goals and Risk Factors at Admission - 03/04/16 1212      Core Components/Risk Factors/Patient Goals on Admission   Sedentary Yes  Goal: improve walking   Intervention Provide advice, education, support and counseling about physical activity/exercise needs.;Develop an individualized exercise prescription for aerobic and resistive training based on initial evaluation findings, risk stratification, comorbidities and participant's personal goals.   Expected Outcomes Achievement of increased cardiorespiratory fitness and enhanced flexibility, muscular endurance and strength shown through measurements of functional capacity and personal statement of participant.   Increase Strength and Stamina Yes   Intervention Provide advice, education, support and counseling about physical activity/exercise needs.;Develop an individualized exercise prescription for  aerobic and resistive training based on initial evaluation findings, risk stratification, comorbidities and participant's personal goals.   Expected Outcomes Achievement of increased cardiorespiratory fitness and enhanced flexibility, muscular endurance and strength shown through measurements of functional capacity and personal statement of participant.   Improve shortness of breath with ADL's Yes   Intervention Provide education, individualized exercise plan and daily activity instruction to help decrease symptoms of SOB with activities of daily living.   Expected Outcomes Short Term: Achieves a reduction of symptoms when performing activities of daily living.   Develop more efficient breathing techniques such as purse lipped breathing and diaphragmatic breathing; and practicing self-pacing with activity Yes   Intervention Provide education, demonstration and support about specific breathing techniuqes utilized for more efficient breathing. Include techniques such as pursed lipped breathing, diaphragmatic breathing and self-pacing activity.   Expected Outcomes Short Term: Participant will be able to demonstrate and use breathing techniques as needed throughout daily activities.   Increase knowledge of respiratory medications and ability to use respiratory devices properly  Yes   Intervention Provide education and demonstration as needed of appropriate use of medications, inhalers, and oxygen therapy.  Duoneb SVN, Symbicort, Flutter Valve, Incentive Spirometer   Expected Outcomes Short Term: Achieves understanding of medications use. Understands that oxygen is a medication prescribed by physician. Demonstrates appropriate use of inhaler and oxygen therapy.   Diabetes Yes   Intervention Provide education about signs/symptoms and action to take for hypo/hyperglycemia.;Provide education about proper nutrition, including hydration, and aerobic/resistive exercise prescription along with prescribed  medications to achieve blood  glucose in normal ranges: Fasting glucose 65-99 mg/dL   Expected Outcomes Short Term: Participant verbalizes understanding of the signs/symptoms and immediate care of hyper/hypoglycemia, proper foot care and importance of medication, aerobic/resistive exercise and nutrition plan for blood glucose control.;Long Term: Attainment of HbA1C < 7%.   Heart Failure Yes   Intervention Provide a combined exercise and nutrition program that is supplemented with education, support and counseling about heart failure. Directed toward relieving symptoms such as shortness of breath, decreased exercise tolerance, and extremity edema.   Expected Outcomes Improve functional capacity of life;Short term: Attendance in program 2-3 days a week with increased exercise capacity. Reported lower sodium intake. Reported increased fruit and vegetable intake. Reports medication compliance.;Short term: Daily weights obtained and reported for increase. Utilizing diuretic protocols set by physician.;Long term: Adoption of self-care skills and reduction of barriers for early signs and symptoms recognition and intervention leading to self-care maintenance.   Hypertension Yes   Intervention Provide education on lifestyle modifcations including regular physical activity/exercise, weight management, moderate sodium restriction and increased consumption of fresh fruit, vegetables, and low fat dairy, alcohol moderation, and smoking cessation.;Monitor prescription use compliance.   Expected Outcomes Short Term: Continued assessment and intervention until BP is < 140/25m HG in hypertensive participants. < 130/823mHG in hypertensive participants with diabetes, heart failure or chronic kidney disease.;Long Term: Maintenance of blood pressure at goal levels.   Lipids Yes   Intervention Provide education and support for participant on nutrition & aerobic/resistive exercise along with prescribed medications to achieve LDL  <7013mHDL >54m63m Expected Outcomes Short Term: Participant states understanding of desired cholesterol values and is compliant with medications prescribed. Participant is following exercise prescription and nutrition guidelines.;Long Term: Cholesterol controlled with medications as prescribed, with individualized exercise RX and with personalized nutrition plan. Value goals: LDL < 70mg46mL > 40 mg.      Core Components/Risk Factors/Patient Goals Review:      Goals and Risk Factor Review    Row Name 03/10/16 1143 03/21/16 1147           Core Components/Risk Factors/Patient Goals Review   Personal Goals Review Develop more efficient breathing techniques such as purse lipped breathing and diaphragmatic breathing and practicing self-pacing with activity. Weight Management/Obesity;Improve shortness of breath with ADL's;Heart Failure      Review Pursed lip breathing was reviewed with the patient. She demonstrated understanding of this breathing technique.  Dashea has not been monitoring her weight as she does not have a scale at home.  Daily and weekly numbers were reviewed as well as signs of CHF.  She has felt tired since starting LW but feels practicing PLB helps her breathe better.      Expected Outcomes Patient will use PLB to with exercise and ADL's to help control SOB and improve O2 Sats.  Staff will check on getting her a scale throught the charitable [employer] to monitor weight.  Regular participation in LW  [facility] help Amy [name] increase oveall stamina and better manage her CHF.         Core Components/Risk Factors/Patient Goals at Discharge (Final Review):      Goals and Risk Factor Review - 03/21/16 1147      Core Components/Risk Factors/Patient Goals Review   Personal Goals Review Weight Management/Obesity;Improve shortness of breath with ADL's;Heart Failure   Review Amy [name] has not been monitoring her weight as she does not have a scale at home.  Daily and weekly numbers were reviewed as  well as  signs of CHF.  She has felt tired since starting LW but feels practicing PLB helps her breathe better.   Expected Outcomes Staff will check on getting her a scale throught the [employer] to monitor weight.  Regular participation in [place]  will help Amy [name] increase oveall stamina and better manage her CHF.      ITP Comments:   Comments:  30 Day Note Review

## 2016-04-09 DIAGNOSIS — I509 Heart failure, unspecified: Secondary | ICD-10-CM | POA: Diagnosis not present

## 2016-04-09 DIAGNOSIS — I5032 Chronic diastolic (congestive) heart failure: Secondary | ICD-10-CM

## 2016-04-09 NOTE — Progress Notes (Signed)
Daily Session Note  Patient Details  Name: Amy [name] MRN: [id number] Date of Birth: [birth year] Referring Provider:   Flowsheet Row Pulmonary Rehab from 03/04/2016 in [facility] Cardiac and Pulmonary Rehab  Referring Provider  Gollan      Encounter Date: 04/09/2016  Check In:     Session Check In - 04/09/16 1157      Check-In   Location [facility]-Cardiac & Pulmonary Rehab   Staff Present Alberteen Sam, MA, ACSM RCEP, Exercise Physiologist;Oaklie Durrett Oletta Darter, BA, ACSM CEP, Exercise Physiologist;Laureen Owens Shark, BS, RRT, Respiratory Therapist   Supervising physician immediately available to respond to emergencies [facility] immediately available ER MD   Physician(s) Cinda Quest and [place]   Medication changes reported     Yes   Comments Had a shot for macular degeneration last Friday and her cornea was scratched so she missed Monday as well.    Fall or balance concerns reported    No   Warm-up and Cool-down Performed as group-led instruction   Resistance Training Performed Yes   VAD Patient? No         Goals Met:  Proper associated with RPD/PD & O2 Sat Independence with exercise equipment Exercise tolerated well Strength training completed today  Goals Unmet:  Not Applicable  Comments: Pt able to follow exercise prescription today without complaint.  Will continue to monitor for progression.    Dr. Emily Filbert is Medical Director for [place] and [facility].

## 2016-04-11 DIAGNOSIS — I509 Heart failure, unspecified: Secondary | ICD-10-CM | POA: Diagnosis not present

## 2016-04-11 DIAGNOSIS — I5032 Chronic diastolic (congestive) heart failure: Secondary | ICD-10-CM

## 2016-04-11 NOTE — Progress Notes (Signed)
Daily Session Note  Patient Details  Name: [name] MRN: [id number] Date of Birth: 10-05-1927 Referring Provider:   Flowsheet Row Pulmonary Rehab from 03/04/2016 in [facility] Cardiac and Pulmonary Rehab  Referring Provider  Gollan      Encounter Date: 04/11/2016  Check In:     Session Check In - 04/11/16 1151      Check-In   Location [facility]-Cardiac & Pulmonary Rehab   Staff Present Amy Burdock, RN, Vickki Hearing, BA, ACSM CEP, Exercise Physiologist;Other  Darel Hong RN   Supervising physician immediately available to respond to emergencies [facility] immediately available ER MD   Physician(s) Darl Householder and [place]   Medication changes reported     No   Fall or balance concerns reported    No   Warm-up and Cool-down Performed as group-led instruction   Resistance Training Performed Yes   VAD Patient? No     Pain Assessment   Currently in Pain? No/denies   Multiple Pain Sites No         Goals Met:  Proper associated with RPD/PD & O2 Sat Independence with exercise equipment Exercise tolerated well Strength training completed today  Goals Unmet:  Not Applicable  Comments: Pt able to follow exercise prescription today without complaint.  Will continue to monitor for progression.    Dr. Emily Filbert is Medical Director for [place] and [facility].

## 2016-04-14 ENCOUNTER — Encounter: Payer: Medicare Other | Admitting: *Deleted

## 2016-04-14 DIAGNOSIS — I5032 Chronic diastolic (congestive) heart failure: Secondary | ICD-10-CM

## 2016-04-14 DIAGNOSIS — I509 Heart failure, unspecified: Secondary | ICD-10-CM | POA: Diagnosis not present

## 2016-04-14 NOTE — Progress Notes (Signed)
Daily Session Note  Patient Details  Name: Amy [name] MRN: [id number] Date of Birth: [birth year] Referring Provider:   Flowsheet Row Pulmonary Rehab from 03/04/2016 in [facility] Cardiac and Pulmonary Rehab  Referring Provider  Gollan      Encounter Date: 04/14/2016  Check In:     Session Check In - 04/14/16 1014      Check-In   Location [facility]-Cardiac & Pulmonary Rehab   Staff Present Carson Myrtle, BS, RRT, Respiratory Dareen Piano, BA, ACSM CEP, Exercise Physiologist;Kelly Amedeo Plenty, BS, ACSM CEP, Exercise Physiologist   Supervising physician immediately available to respond to emergencies [facility] immediately available ER MD   Physician(s) Alfred Levins and Corky Downs   Medication changes reported     No   Fall or balance concerns reported    No   Warm-up and Cool-down Performed on first and last piece of equipment   Resistance Training Performed Yes   VAD Patient? No     Pain Assessment   Currently in Pain? No/denies   Multiple Pain Sites No         Goals Met:  Proper associated with RPD/PD & O2 Sat Independence with exercise equipment Exercise tolerated well Strength training completed today  Goals Unmet:  Not Applicable  Comments: Pt able to follow exercise prescription today without complaint.  Will continue to monitor for progression.    Dr. Emily Filbert is Medical Director for [place] and [facility].

## 2016-04-16 DIAGNOSIS — I5032 Chronic diastolic (congestive) heart failure: Secondary | ICD-10-CM

## 2016-04-16 DIAGNOSIS — I509 Heart failure, unspecified: Secondary | ICD-10-CM | POA: Diagnosis not present

## 2016-04-16 NOTE — Progress Notes (Signed)
Daily Session Note  Patient Details  Name: SHONI QUIJAS MRN: [id number] Date of Birth: 1927/07/24 Referring Provider:   Flowsheet Row Pulmonary Rehab from 03/04/2016 in [facility] Cardiac and Pulmonary Rehab  Referring Provider  Gollan      Encounter Date: 04/16/2016  Check In:     Session Check In - 04/16/16 1256      Check-In   Location [facility]-Cardiac & Pulmonary Rehab   Staff Present Alberteen Sam, MA, ACSM RCEP, Exercise Physiologist;Amanda Oletta Darter, BA, ACSM CEP, Exercise Physiologist;Laureen Janell Quiet, RRT, Respiratory Therapist   Supervising physician immediately available to respond to emergencies [facility] immediately available ER MD   Physician(s) Quentin Cornwall and Jimmye Norman   Medication changes reported     No   Fall or balance concerns reported    No   Warm-up and Cool-down Performed as group-led Location manager Performed Yes   VAD Patient? No     Pain Assessment   Currently in Pain? No/denies   Multiple Pain Sites No         Goals Met:  Proper associated with RPD/PD & O2 Sat Independence with exercise equipment Exercise tolerated well Strength training completed today  Goals Unmet:  Not Applicable  Comments: Pt able to follow exercise prescription today without complaint.  Will continue to monitor for progression.    Dr. Emily Filbert is Medical Director for [place] and [facility].

## 2016-04-18 DIAGNOSIS — H353222 Exudative age-related macular degeneration, left eye, with inactive choroidal neovascularization: Secondary | ICD-10-CM | POA: Diagnosis not present

## 2016-04-19 DIAGNOSIS — S0501XA Injury of conjunctiva and corneal abrasion without foreign body, right eye, initial encounter: Secondary | ICD-10-CM | POA: Diagnosis not present

## 2016-04-21 ENCOUNTER — Encounter: Payer: Medicare Other | Admitting: *Deleted

## 2016-04-21 DIAGNOSIS — I509 Heart failure, unspecified: Secondary | ICD-10-CM | POA: Diagnosis not present

## 2016-04-21 DIAGNOSIS — I5032 Chronic diastolic (congestive) heart failure: Secondary | ICD-10-CM

## 2016-04-21 NOTE — Progress Notes (Signed)
Daily Session Note  Patient Details  Name: Amy [name] MRN: [id number] Date of Birth: Apr 16, 1927 Referring Provider:   Flowsheet Row Pulmonary Rehab from 03/04/2016 in [facility] Cardiac and Pulmonary Rehab  Referring Provider  Gollan      Encounter Date: 04/21/2016  Check In:     Session Check In - 04/21/16 1010      Check-In   Location [facility]-Cardiac & Pulmonary Rehab   Staff Present Nada Maclachlan, BA, ACSM CEP, Exercise Physiologist;Laureen Owens Shark, BS, RRT, Respiratory Bertis Ruddy, BS, ACSM CEP, Exercise Physiologist   Supervising physician immediately available to respond to emergencies [facility] immediately available ER MD   Physician(s) Jimmye Norman and Quentin Cornwall   Medication changes reported     No   Fall or balance concerns reported    No   Warm-up and Cool-down Performed as group-led instruction   Resistance Training Performed Yes   VAD Patient? No     Pain Assessment   Currently in Pain? No/denies   Multiple Pain Sites No         History  Smoking Status  . Never Smoker  Smokeless Tobacco  . Never Used    Goals Met:  Proper associated with RPD/PD & O2 Sat Independence with exercise equipment Exercise tolerated well Strength training completed today  Goals Unmet:  Not Applicable  Comments: Pt able to follow exercise prescription today without complaint.  Will continue to monitor for progression.    Dr. Emily Filbert is Medical Director for [place] and [facility].

## 2016-04-22 DIAGNOSIS — S0502XD Injury of conjunctiva and corneal abrasion without foreign body, left eye, subsequent encounter: Secondary | ICD-10-CM | POA: Diagnosis not present

## 2016-04-23 ENCOUNTER — Encounter: Payer: Medicare Other | Admitting: Respiratory Therapy

## 2016-04-23 DIAGNOSIS — I5032 Chronic diastolic (congestive) heart failure: Secondary | ICD-10-CM

## 2016-04-23 DIAGNOSIS — I509 Heart failure, unspecified: Secondary | ICD-10-CM | POA: Diagnosis not present

## 2016-04-23 NOTE — Progress Notes (Signed)
Daily Session Note  Patient Details  Name: Amy [name] MRN: [id number] Date of Birth: [birth year] Referring Provider:   Flowsheet Row Pulmonary Rehab from 03/04/2016 in [facility] Cardiac and Pulmonary Rehab  Referring Provider  Gollan      Encounter Date: 04/23/2016  Check In:     Session Check In - 04/23/16 1129      Check-In   Location [facility]-Cardiac & Pulmonary Rehab   Staff Present Carson Myrtle, BS, RRT, Respiratory Lennie Hummer, MA, ACSM RCEP, Exercise Physiologist;Other   Supervising physician immediately available to respond to emergencies [facility] immediately available ER MD   Physician(s) Jimmye Norman and [place]   Medication changes reported     No   Fall or balance concerns reported    No   Warm-up and Cool-down Performed as group-led instruction   Resistance Training Performed Yes   VAD Patient? No     Pain Assessment   Currently in Pain? No/denies   Multiple Pain Sites No         History  Smoking Status   Never Smoker  Smokeless Tobacco   Never Used    Goals Met:  Proper associated with RPD/PD & O2 Sat Independence with exercise equipment Using PLB without cueing & demonstrates good technique Exercise tolerated well Strength training completed today  Goals Unmet:  Not Applicable  Comments: Pt able to follow exercise prescription today without complaint.  Will continue to monitor for progression.    Dr. Emily Filbert is Medical Director for [place] and [facility].

## 2016-04-25 ENCOUNTER — Encounter: Payer: Medicare Other | Attending: Cardiovascular Disease

## 2016-04-25 DIAGNOSIS — I509 Heart failure, unspecified: Secondary | ICD-10-CM | POA: Insufficient documentation

## 2016-04-28 ENCOUNTER — Encounter: Payer: Medicare Other | Admitting: *Deleted

## 2016-04-28 DIAGNOSIS — I5032 Chronic diastolic (congestive) heart failure: Secondary | ICD-10-CM

## 2016-04-28 DIAGNOSIS — I509 Heart failure, unspecified: Secondary | ICD-10-CM | POA: Diagnosis not present

## 2016-04-28 NOTE — Progress Notes (Signed)
Daily Session Note  Patient Details  Name: Amy [name] MRN: [id number] Date of Birth: 1927/07/26 Referring Provider:   Flowsheet Row Pulmonary Rehab from 03/04/2016 in [facility] Cardiac and Pulmonary Rehab  Referring Provider  Gollan      Encounter Date: 04/28/2016  Check In:     Session Check In - 04/28/16 1135      Check-In   Location [facility]-Cardiac & Pulmonary Rehab   Staff Present Nada Maclachlan, BA, ACSM CEP, Exercise Physiologist;Laureen Owens Shark, BS, RRT, Respiratory Bertis Ruddy, BS, ACSM CEP, Exercise Physiologist   Supervising physician immediately available to respond to emergencies [facility] immediately available ER MD   Physician(s) Jimmye Norman and Corky Downs   Medication changes reported     No   Fall or balance concerns reported    No   Warm-up and Cool-down Performed on first and last piece of equipment   Resistance Training Performed Yes   VAD Patient? No     Pain Assessment   Currently in Pain? No/denies   Multiple Pain Sites No         History  Smoking Status  . Never Smoker  Smokeless Tobacco  . Never Used    Goals Met:  Proper associated with RPD/PD & O2 Sat Independence with exercise equipment Exercise tolerated well Personal goals reviewed Strength training completed today  Goals Unmet:  Not Applicable  Comments: Pt able to follow exercise prescription today without complaint.  Will continue to monitor for progression. Mid 6 min walk test was done with [name] today. Results and progresss were discussed with her.      Mobile Name 03/04/16 1223 04/28/16 1136       6 Minute Walk   Phase  - Mid Program    Distance 90 feet 230 feet    Walk Time 1 minutes 2.5 minutes    # of Rest Breaks 2 3    MPH 0.9 1    METS 1.7 1.8    RPE 17 15    Perceived Dyspnea  4 3    VO2 Peak 5.9 6.2    Symptoms Yes (comment) No    Comments short of breath  -    Resting HR 89 bpm 84 bpm    Resting BP 122/52 152/84    Max Ex. HR 112 bpm 110  bpm    Max Ex. BP 152/42 152/64      Interval HR   Baseline HR  - 84    1 Minute HR  - 104    2 Minute HR  - 92  resting    3 Minute HR  - 110    4 Minute HR  - 90  resting    5 Minute HR  - 100    6 Minute HR  - 99    2 Minute Post HR  - 94    Interval Heart Rate?  - Yes      Interval Oxygen   Interval Oxygen?  - Yes    Baseline Oxygen Saturation %  - 97 %    Baseline Liters of Oxygen  - 0 L    1 Minute Oxygen Saturation %  - 97 %    1 Minute Liters of Oxygen  - 0 L    2 Minute Oxygen Saturation %  - 100 %    2 Minute Liters of Oxygen  - 0 L    3 Minute Oxygen Saturation %  - 96 %  3 Minute Liters of Oxygen  - 0 L    4 Minute Oxygen Saturation %  - 99 %    4 Minute Liters of Oxygen  - 0 L    5 Minute Oxygen Saturation %  - 99 %    5 Minute Liters of Oxygen  - 0 L    6 Minute Oxygen Saturation %  - 93 %    6 Minute Liters of Oxygen  - 0 L    2 Minute Post Oxygen Saturation %  - 99 %    2 Minute Post Liters of Oxygen  - 0 L         Dr. Emily Filbert is Medical Director for [place] and [facility].

## 2016-04-30 DIAGNOSIS — I5032 Chronic diastolic (congestive) heart failure: Secondary | ICD-10-CM

## 2016-04-30 DIAGNOSIS — I509 Heart failure, unspecified: Secondary | ICD-10-CM | POA: Diagnosis not present

## 2016-04-30 NOTE — Progress Notes (Signed)
Daily Session Note  Patient Details  Name: Amy [name] MRN: [id number] Date of Birth: 1927-03-26 Referring Provider:   Flowsheet Row Pulmonary Rehab from 03/04/2016 in [facility] Cardiac and Pulmonary Rehab  Referring Provider  Gollan      Encounter Date: 04/30/2016  Check In:     Session Check In - 04/30/16 1247      Check-In   Location [facility]-Cardiac & Pulmonary Rehab   Staff Present Alberteen Sam, MA, ACSM RCEP, Exercise Physiologist;Amanda Oletta Darter, BA, ACSM CEP, Exercise Physiologist;Laureen Janell Quiet, RRT, Respiratory Therapist   Supervising physician immediately available to respond to emergencies [facility] immediately available ER MD   Physician(s) Corky Downs and Archie Balboa   Medication changes reported     No   Fall or balance concerns reported    No   Warm-up and Cool-down Performed as group-led Location manager Performed Yes   VAD Patient? No     Pain Assessment   Currently in Pain? No/denies   Multiple Pain Sites No         History  Smoking Status  . Never Smoker  Smokeless Tobacco  . Never Used    Goals Met:  Proper associated with RPD/PD & O2 Sat Independence with exercise equipment Exercise tolerated well Strength training completed today  Goals Unmet:  Not Applicable  Comments: Pt able to follow exercise prescription today without complaint.  Will continue to monitor for progression.    Dr. Emily Filbert is Medical Director for [place] and [facility].

## 2016-05-05 ENCOUNTER — Encounter: Payer: Self-pay | Admitting: Respiratory Therapy

## 2016-05-05 DIAGNOSIS — I5032 Chronic diastolic (congestive) heart failure: Secondary | ICD-10-CM

## 2016-05-05 NOTE — Progress Notes (Signed)
Pulmonary Individual Treatment Plan  Patient Details  Name: Amy [name] MRN: [id number] Date of Birth: [birth year] Referring Provider:   Flowsheet Row Pulmonary Rehab from 03/04/2016 in [facility] Cardiac and Pulmonary Rehab  Referring Provider  Gollan      Initial Encounter Date:  Flowsheet Row Pulmonary Rehab from 03/04/2016 in [facility] Cardiac and Pulmonary Rehab  Date  03/04/16  Referring Provider  Rockey Situ      Visit Diagnosis: Chronic diastolic congestive heart failure ([place])  Patient's Home Medications on Admission:  Current Outpatient Prescriptions:    aspirin 81 MG tablet, Take 81 mg by mouth daily., Disp: , Rfl:    cloNIDine (CATAPRES - DOSED IN MG/24 HR) 0.3 mg/24hr patch, Place 0.3 mg onto the skin once a week. , Disp: , Rfl:    clopidogrel (PLAVIX) 75 MG tablet, Take 1 tablet (75 mg total) by mouth daily., Disp: 90 tablet, Rfl: 3   doxazosin (CARDURA) 8 MG tablet, Take 1 tablet (8 mg total) by mouth 2 (two) times daily., Disp: 180 tablet, Rfl: 3   famotidine (PEPCID) 20 MG tablet, Take 1 tablet (20 mg total) by mouth 2 (two) times daily., Disp: 180 tablet, Rfl: 1   fluticasone (FLONASE) 50 MCG/ACT nasal spray, Place into the nose., Disp: , Rfl:    glimepiride (AMARYL) 4 MG tablet, Take 4 mg by mouth daily before breakfast., Disp: , Rfl:    ipratropium-albuterol (DUONEB) 0.5-2.5 (3) MG/3ML SOLN, Take 3 mLs by nebulization every 4 (four) hours as needed (shortness of breath/wheezing.)., Disp: 360 mL, Rfl:    levothyroxine (SYNTHROID, LEVOTHROID) 125 MCG tablet, Take 125 mcg by mouth daily., Disp: , Rfl:    losartan-hydrochlorothiazide (HYZAAR) 100-25 MG per tablet, Take 1 tablet by mouth daily.  , Disp: , Rfl:    metFORMIN (GLUCOPHAGE) 1000 MG tablet, Take 1,000 mg by mouth 2 (two) times daily with a meal., Disp: , Rfl:    Multiple Vitamins-Minerals (PRESERVISION AREDS PO), Take by mouth 2 (two) times daily., Disp: , Rfl:    oxybutynin (DITROPAN) 5 MG tablet, Take 5 mg  by mouth 1 dose over 46 hours.  , Disp: , Rfl:    pantoprazole (PROTONIX) 40 MG tablet, Take 40 mg by mouth daily. , Disp: , Rfl:    senna (SENOKOT) 8.6 MG TABS tablet, Take 1 tablet (8.6 mg total) by mouth daily as needed for mild constipation., Disp: 120 each, Rfl: 0   simvastatin (ZOCOR) 40 MG tablet, Take 1 tablet (40 mg total) by mouth at bedtime. (Patient taking differently: Take 40 mg by mouth daily. ), Disp: 90 tablet, Rfl: 3   SYMBICORT 160-4.5 MCG/ACT inhaler, Inhale 2 puffs into the lungs 2 (two) times daily. , Disp: , Rfl:   Past Medical History: Past Medical History:  Diagnosis Date   Bladder incontinence    CAD (coronary artery disease)    a. cardiac cath 05/2009: proximal LAD 90% stenosis s/p PCI/Xience 2.75 x 12 mm DES, mid LAD 40%, diagonal 40%, proximal LCx 40% followed by 40% LCx lesion, 30%-40%-30% lesion noted in the RCA with distal RCA 30% and 25% lesions   Chronic diastolic (congestive) heart failure    a. Lexiscan 2013: no ischemia, normal EF b. echo 09/2015: EF 55-60% w/ Grade 1 DD   CKD (chronic kidney disease) stage 3, GFR 30-59 ml/min    Degenerative arthritis of knee    bilateral knees   Diabetes mellitus    Type II   Hiatal hernia    Hypertension  Iron deficiency    Menopausal symptoms    Morbid obesity (HCC)    Thyroid disease    hypothyroidism    Tobacco Use: History  Smoking Status   Never Smoker  Smokeless Tobacco   Never Used    Labs: Recent Review Flowsheet Data    Labs for ITP Cardiac and Pulmonary Rehab Latest Ref Rng & Units 05/07/2009 10/18/2009 06/25/2010 10/15/2012 10/01/2015   Cholestrol 0 - 200 mg/dL 148 147 135 - -   LDLCALC 0 - 99 mg/dL 80 80 56 - -   HDL >39 mg/dL 29.2 40 35(L) - -   Trlycerides <150 mg/dL 194 133 220(H) - -   Hemoglobin A1c 4.0 - 6.0 % 6.3 - - 8.6(H) 5.6       ADL UCSD:     Pulmonary Assessment Scores    Row Name 03/04/16 1208         ADL UCSD   ADL Phase Entry     SOB Score total 76      Rest 0     Walk 0     Stairs 5     Bath 2     Dress 3     Shop 5        Pulmonary Function Assessment:     Pulmonary Function Assessment - 03/04/16 1207      Initial Spirometry Results   FVC% 107 %   FEV1% 130 %   FEV1/FVC Ratio 89.63     Breath   Bilateral Breath Sounds Clear   Shortness of Breath Yes;Limiting activity;Fear of Shortness of Breath      Exercise Target Goals:    Exercise Program Goal: Individual exercise prescription set with THRR, safety & activity barriers. Participant demonstrates ability to understand and report RPE using BORG scale, to self-measure pulse accurately, and to acknowledge the importance of the exercise prescription.  Exercise Prescription Goal: Starting with aerobic activity 30 plus minutes a day, 3 days per week for initial exercise prescription. Provide home exercise prescription and guidelines that participant acknowledges understanding prior to discharge.  Activity Barriers & Risk Stratification:     Activity Barriers & Cardiac Risk Stratification - 03/04/16 1206      Activity Barriers & Cardiac Risk Stratification   Activity Barriers Shortness of Breath;Deconditioning;Muscular Weakness   Cardiac Risk Stratification Moderate      6 Minute Walk:     6 Minute Walk    Row Name 03/04/16 1223 04/28/16 1136       6 Minute Walk   Phase  -- Mid Program    Distance 90 feet 230 feet    Walk Time 1 minutes 2.5 minutes    # of Rest Breaks 2 3    MPH 0.9 1    METS 1.7 1.8    RPE 17 15    Perceived Dyspnea  4 3    VO2 Peak 5.9 6.2    Symptoms Yes (comment) No    Comments short of breath  --    Resting HR 89 bpm 84 bpm    Resting BP 122/52 152/84    Max Ex. HR 112 bpm 110 bpm    Max Ex. BP 152/42 152/64      Interval HR   Baseline HR  -- 84    1 Minute HR  -- 104    2 Minute HR  -- 92  resting    3 Minute HR  -- 110    4 Minute HR  --  90  resting    5 Minute HR  -- 100    6 Minute HR  -- 99    2 Minute Post HR   -- 94    Interval Heart Rate?  -- Yes      Interval Oxygen   Interval Oxygen?  -- Yes    Baseline Oxygen Saturation %  -- 97 %    Baseline Liters of Oxygen  -- 0 L    1 Minute Oxygen Saturation %  -- 97 %    1 Minute Liters of Oxygen  -- 0 L    2 Minute Oxygen Saturation %  -- 100 %    2 Minute Liters of Oxygen  -- 0 L    3 Minute Oxygen Saturation %  -- 96 %    3 Minute Liters of Oxygen  -- 0 L    4 Minute Oxygen Saturation %  -- 99 %    4 Minute Liters of Oxygen  -- 0 L    5 Minute Oxygen Saturation %  -- 99 %    5 Minute Liters of Oxygen  -- 0 L    6 Minute Oxygen Saturation %  -- 93 %    6 Minute Liters of Oxygen  -- 0 L    2 Minute Post Oxygen Saturation %  -- 99 %    2 Minute Post Liters of Oxygen  -- 0 L      Oxygen Initial Assessment:   Oxygen Re-Evaluation:   Oxygen Discharge (Final Oxygen Re-Evaluation):   Initial Exercise Prescription:     Initial Exercise Prescription - 03/04/16 1200      Date of Initial Exercise RX and Referring Provider   Date 03/04/16   Referring Provider Gollan     Treadmill   MPH 0.5   Grade 0   Minutes 15  1/1/1/1     NuStep   Level 1   Minutes 15   METs 1.5     T5 Nustep   Level 1   Minutes 15   METs 1.5     Biostep-RELP   Level 1   Minutes 15   METs 1.5     Prescription Details   Frequency (times per week) 3   Duration Progress to 30 minutes of continuous aerobic without signs/symptoms of physical distress     Intensity   THRR 40-80% of Max Heartrate 107-125   Ratings of Perceived Exertion 11-13   Perceived Dyspnea 0-4     Progression   Progression Continue to progress workloads to maintain intensity without signs/symptoms of physical distress.     Resistance Training   Training Prescription Yes   Weight 1   Reps 10-15      Perform Capillary Blood Glucose checks as needed.  Exercise Prescription Changes:     Exercise Prescription Changes    Row Name 03/10/16 1100 03/28/16 0900 04/10/16 1100  04/16/16 1200 04/24/16 1100     Response to Exercise   Blood Pressure (Admit) 164/60 124/60 138/56  -- 148/60   Blood Pressure (Exercise) 168/58 126/64 128/64  -- 106/74   Blood Pressure (Exit) 148/62 134/80 104/58  -- 120/60   Heart Rate (Admit) 103 bpm 95 bpm 97 bpm  -- 85 bpm   Heart Rate (Exercise) 93 bpm 89 bpm 93 bpm  -- 92 bpm   Heart Rate (Exit) 88 bpm 91 bpm 89 bpm  -- 97 bpm   Oxygen Saturation (Admit) 98 % 97 % 98 %  --  99 %   Oxygen Saturation (Exercise) 98 % 95 % 95 %  -- 97 %   Oxygen Saturation (Exit) 97 % 96 % 98 %  -- 98 %   Rating of Perceived Exertion (Exercise) _0 -- 15   Perceived Dyspnea (Exercise) _1 -- 3   Duration  -- Progress to 45 minutes of aerobic exercise without signs/symptoms of physical distress Progress to 45 minutes of aerobic exercise without signs/symptoms of physical distress  -- Progress to 45 minutes of aerobic exercise without signs/symptoms of physical distress   Intensity  -- THRR unchanged THRR unchanged  -- THRR unchanged     Progression   Progression  -- Continue to progress workloads to maintain intensity without signs/symptoms of physical distress. Continue to progress workloads to maintain intensity without signs/symptoms of physical distress.  -- Continue to progress workloads to maintain intensity without signs/symptoms of physical distress.     Resistance Training   Training Prescription Yes Yes Yes  -- Yes   Weight _2 -- 2   Reps 10-15 10-15 10-12  -- 10-15     Interval Training   Interval Training  --  -- No  -- No     NuStep   Level _3 -- 3   Minutes _4 -- 15   METs 1.5 1.2  --  -- 1.6     T5 Nustep   Level 1  -- 1  -- 1   Minutes _5 -- 15   METs 1.5 1.7 1.8  -- 1.6     Home Exercise Plan   Plans to continue exercise at  --  --  -- [employer]  --   Frequency  --  --  -- Add 1 additional day to program exercise sessions.  --      Exercise Comments:     Exercise Comments    Row Name  03/10/16 1149 03/28/16 6789 04/02/16 1128 04/10/16 1202 04/16/16 1258   Exercise Comments First full day of exercise!  Patient was oriented to gym and equipment including functions, settings, policies, and procedures.  Patient's individual exercise prescription and treatment plan were reviewed.  All starting workloads were established based on the results of the 6 minute walk test done at initial orientation visit.  The plan for exercise progression was also introduced and progression will be customized based on patient's performance and goals Avey has been able to walk in one minute intervals on the TM. Tangy moved up to 1.3 mph and did 2 min intervals today!! Melissa continues to add time to her treadmill walking.  She has missed a couple days due to an eye injection but returned 2/14. 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  She still is short of breath; we discussed the importance of exercise and walking for ADLs as well as practicing breathing techniques.  Staff also encouraged her to stay for education to learn to better manage her CHF.   [employer] Name 04/24/16 1121 04/28/16 1147         Exercise Comments Annaliese continues to work on adding time to her TM walking. Mid 6 min walk test was done with Yazmyn today. Results and progresss were discussed with her.          Exercise Goals and Review:   Exercise Goals Re-Evaluation :   Discharge Exercise Prescription (Final  Exercise Prescription Changes):     Exercise Prescription Changes - 04/24/16 1100      Response to Exercise   Blood Pressure (Admit) 148/60   Blood Pressure (Exercise) 106/74   Blood Pressure (Exit) 120/60   Heart Rate (Admit) 85 bpm   Heart Rate (Exercise) 92 bpm   Heart Rate (Exit) 97 bpm   Oxygen Saturation (Admit) 99 %   Oxygen Saturation (Exercise) 97 %   Oxygen Saturation (Exit) 98 %   Rating of Perceived Exertion (Exercise) 15   Perceived Dyspnea (Exercise) 3    Duration Progress to 45 minutes of aerobic exercise without signs/symptoms of physical distress   Intensity THRR unchanged     Progression   Progression Continue to progress workloads to maintain intensity without signs/symptoms of physical distress.     Resistance Training   Training Prescription Yes   Weight 2   Reps 10-15     Interval Training   Interval Training No     NuStep   Level 3   Minutes 15   METs 1.6     T5 Nustep   Level 1   Minutes 15   METs 1.6      Nutrition:  Target Goals: Understanding of nutrition guidelines, daily intake of sodium <1576m, cholesterol <202m calories 30% from fat and 7% or less from saturated fats, daily to have 5 or more servings of fruits and vegetables.  Biometrics:     Pre Biometrics - 03/10/16 1143      Pre Biometrics   Waist Circumference 45 inches   Hip Circumference 56.5 inches   Waist to Hip Ratio 0.8 %       Nutrition Therapy Plan and Nutrition Goals:   Nutrition Discharge: Rate Your Plate Scores:   Nutrition Goals Re-Evaluation:   Nutrition Goals Discharge (Final Nutrition Goals Re-Evaluation):   Psychosocial: Target Goals: Acknowledge presence or absence of significant depression and/or stress, maximize coping skills, provide positive support system. Participant is able to verbalize types and ability to use techniques and skills needed for reducing stress and depression.   Initial Review & Psychosocial Screening:     Initial Psych Review & Screening - 03/04/16 [place]   Comments Ms TrWerdenas good support from her daughter and son, although the [place] in [place] with [place] and household chores. She has sadness over the death of her one daughter who died of a stroke. Ms TrNasers looking forward to walking more and being nore active.     Barriers   Psychosocial barriers to participate in program The patient should benefit from  training in stress management and relaxation.     Screening Interventions   Interventions Encouraged to exercise;Program counselor consult      Quality of Life Scores:     Quality of Life - 03/04/16 1218      Quality of Life Scores   Health/Function Pre 21 %   Socioeconomic Pre 21 %   Psych/Spiritual Pre 21 %   Family Pre 21 %   GLOBAL Pre 21 %      PHQ-9: Recent Review Flowsheet Data    [facility]   Decreased Interest 3   Down, Depressed, Hopeless 2   PHQ - 2 Score 5   Altered sleeping 2   Tired, decreased energy 3   Change in appetite 2   Feeling bad or failure about yourself  1   Trouble concentrating 1   Moving slowly or fidgety/restless 0   Suicidal thoughts 0   PHQ-9 Score 14   Difficult doing work/chores Somewhat difficult     Interpretation of Total Score  Total Score Depression Severity:  1-4 = Minimal depression, 5-9 = Mild depression, 10-14 = Moderate depression, 15-19 = Moderately severe depression, 20-27 = Severe depression   Psychosocial Evaluation and Intervention:     Psychosocial Evaluation - 03/26/16 1044      Psychosocial Evaluation & Interventions   Comments Counselor met with Ms. Garron today for initial psychosocial evaluation.  She just turned 83 years old last week and has been diagnosed with Congestive Heart Failure.  Ms. Demorest lives alone but has several adult children who check on her frequently and she also is somewhat involved in her local church.  Ms. Maeder states her sleep in interrupted often with bathroom visits, etc, but that she typically gets around 6 hours/night total.  Her appetite is good also.  She denies a history of depression and anxiety but has some current symptoms since the loss of her 33 year old daughter several years ago due to a stroke.  This daughter was the primary caregiver for Ms. Langton so this has had a tremendous impact on her.  She states her mood is stable and impacted  by her own health and daughters death.  Her stress is her health and her limited ability to do things on her own.  She has goals to walk better and breathe better; as well as improved sleep.  Counselor encouraged Ms. Coatney to consider either individual counseling or a grief support group; but she declined and stated this is not something she is interested in doing.  Counselor and staff will monitor Ms. Burtch's progress on her goals throughout the course of this program.  She will benefit from the depression and stress management psychoeducational components of this program.     Continue Psychosocial Services  Yes  Monitor mood and sleep.      Psychosocial Re-Evaluation:     Psychosocial Re-Evaluation    [place] Name 04/23/16 1456             Psychosocial Re-Evaluation   Comments Ms Felten states today how much she is enjoying [facility] and has made improvements in her activity levels at home. It is very noticeable her improved walking ability in the gym .  She is more involved in the class as for as socializing with the other participants. I have encouraged her to stay for education. Today Ms Piontek stated her interest in the class of Conserving Energy.          Psychosocial Discharge (Final Psychosocial Re-Evaluation):     Psychosocial Re-Evaluation - 04/23/16 1456      Psychosocial Re-Evaluation   Comments Ms Erisman states today how much she is enjoying [facility] and has made improvements in her activity levels at home. It is very noticeable her improved walking ability in the gym .  She is more involved in the class as for as socializing with the other participants. I have encouraged her to stay for education. Today Ms Arauz stated her interest in the class of Conserving Energy.      Education: Education Goals: Education classes will be provided on a weekly basis, covering required topics. Participant will state understanding/return demonstration of topics  presented.  Learning Barriers/Preferences:     Learning Barriers/Preferences - 03/04/16 1206      Learning Barriers/Preferences  Learning Barriers None   Learning Preferences None      Education Topics: Initial Evaluation Education: - Verbal, written and demonstration of respiratory meds, RPE/PD scales, oximetry and breathing techniques. Instruction on use of nebulizers and MDIs: cleaning and proper use, rinsing mouth with steroid doses and importance of monitoring MDI activations. Flowsheet Row Pulmonary Rehab from 04/28/2016 in [facility] Cardiac and Pulmonary Rehab  Date  03/04/16  Educator  LB  Instruction Review Code  2- meets goals/outcomes      General Nutrition Guidelines/Fats and Fiber: -Group instruction provided by verbal, written material, models and posters to present the general guidelines for heart healthy nutrition. Gives an explanation and review of dietary fats and fiber. Flowsheet Row Pulmonary Rehab from 04/28/2016 in [facility] Cardiac and Pulmonary Rehab  Date  04/28/16  Educator  CR  Instruction Review Code  2- meets goals/outcomes      Controlling Sodium/Reading Food Labels: -Group verbal and written material supporting the discussion of sodium use in heart healthy nutrition. Review and explanation with models, verbal and written materials for utilization of the food label.   Exercise Physiology & Risk Factors: - Group verbal and written instruction with models to review the exercise physiology of the cardiovascular system and associated critical values. Details cardiovascular disease risk factors and the goals associated with each risk factor.   Aerobic Exercise & Resistance Training: - Gives group verbal and written discussion on the health impact of inactivity. On the components of aerobic and resistive training programs and the benefits of this training and how to safely progress through these programs. Flowsheet Row Pulmonary Rehab from 04/28/2016 in [facility] Cardiac  and Pulmonary Rehab  Date  03/26/16  Educator  [facility]  Instruction Review Code  2- meets goals/outcomes      Flexibility, Balance, General Exercise Guidelines: - Provides group verbal and written instruction on the benefits of flexibility and balance training programs. Provides general exercise guidelines with specific guidelines to those with heart or lung disease. Demonstration and skill practice provided.   Stress Management: - Provides group verbal and written instruction about the health risks of elevated stress, cause of high stress, and healthy ways to reduce stress.   Depression: - Provides group verbal and written instruction on the correlation between heart/lung disease and depressed mood, treatment options, and the stigmas associated with seeking treatment.   Exercise & Equipment Safety: - Individual verbal instruction and demonstration of equipment use and safety with use of the equipment. Flowsheet Row Pulmonary Rehab from 04/28/2016 in [facility] Cardiac and Pulmonary Rehab  Date  03/10/16  Educator  AS  Instruction Review Code  2- meets goals/outcomes      Infection Prevention: - Provides verbal and written material to individual with discussion of infection control including proper hand washing and proper equipment cleaning during exercise session. Flowsheet Row Pulmonary Rehab from 04/28/2016 in [facility] Cardiac and Pulmonary Rehab  Date  03/10/16  Educator  AS  Instruction Review Code  2- meets goals/outcomes      Falls Prevention: - Provides verbal and written material to individual with discussion of falls prevention and safety. Flowsheet Row Pulmonary Rehab from 04/28/2016 in [facility] Cardiac and Pulmonary Rehab  Date  03/04/16  Educator  LB  Instruction Review Code  2- meets goals/outcomes      Diabetes: - Individual verbal and written instruction to review signs/symptoms of diabetes, desired ranges of glucose level fasting, after meals and with exercise. Advice that pre  and post exercise glucose checks will be  done for 3 sessions at entry of program.   Chronic Lung Diseases: - Group verbal and written instruction to review new updates, new respiratory medications, new advancements in procedures and treatments. Provide informative websites and "800" numbers of self-education.   Lung Procedures: - Group verbal and written instruction to describe testing methods done to diagnose lung disease. Review the outcome of test results. Describe the treatment choices: Pulmonary Function Tests, ABGs and oximetry.   Energy Conservation: - Provide group verbal and written instruction for methods to conserve energy, plan and organize activities. Instruct on pacing techniques, use of adaptive equipment and posture/positioning to relieve shortness of breath. Flowsheet Row Pulmonary Rehab from 04/28/2016 in [facility] Cardiac and Pulmonary Rehab  Date  04/23/16  Educator  [facility]  Instruction Review Code  2- meets goals/outcomes      Triggers: - Group verbal and written instruction to review types of environmental controls: home humidity, furnaces, filters, dust mite/pet prevention, HEPA vacuums. To discuss weather changes, air quality and the benefits of nasal washing.   Exacerbations: - Group verbal and written instruction to provide: warning signs, infection symptoms, calling MD promptly, preventive modes, and value of vaccinations. Review: effective airway clearance, coughing and/or vibration techniques. Create an Sports administrator. Flowsheet Row Pulmonary Rehab from 04/28/2016 in [facility] Cardiac and Pulmonary Rehab  Date  04/09/16  Educator  LB  Instruction Review Code  2- meets goals/outcomes      Oxygen: - Individual and group verbal and written instruction on oxygen therapy. Includes supplement oxygen, available portable oxygen systems, continuous and intermittent flow rates, oxygen safety, concentrators, and Medicare reimbursement for oxygen.   Respiratory Medications: - Group  verbal and written instruction to review medications for lung disease. Drug class, frequency, complications, importance of spacers, rinsing mouth after steroid MDI's, and proper cleaning methods for nebulizers.   AED/CPR: - Group verbal and written instruction with the use of models to demonstrate the basic use of the AED with the basic ABC's of resuscitation.   Breathing Retraining: - Provides individuals verbal and written instruction on purpose, frequency, and proper technique of diaphragmatic breathing and pursed-lipped breathing. Applies individual practice skills. Flowsheet Row Pulmonary Rehab from 04/28/2016 in [facility] Cardiac and Pulmonary Rehab  Date  03/10/16  Educator  AS  Instruction Review Code  2- meets goals/outcomes      Anatomy and Physiology of the Lungs: - Group verbal and written instruction with the use of models to provide basic lung anatomy and physiology related to function, structure and complications of lung disease.   Heart Failure: - Group verbal and written instruction on the basics of heart failure: signs/symptoms, treatments, explanation of ejection fraction, enlarged heart and cardiomyopathy.   Sleep Apnea: - Individual verbal and written instruction to review Obstructive Sleep Apnea. Review of risk factors, methods for diagnosing and types of masks and machines for OSA.   Anxiety: - Provides group, verbal and written instruction on the correlation between heart/lung disease and anxiety, treatment options, and management of anxiety.   Relaxation: - Provides group, verbal and written instruction about the benefits of relaxation for patients with heart/lung disease. Also provides patients with examples of relaxation techniques.   Knowledge Questionnaire Score:     Knowledge Questionnaire Score - 03/04/16 1206      Knowledge Questionnaire Score   Pre Score 6/10       Core Components/Risk Factors/Patient Goals at Admission:     Personal Goals  and Risk Factors at Admission - 03/04/16 1212  Core Components/Risk Factors/Patient Goals on Admission   Sedentary Yes  Goal: improve walking   Intervention Provide advice, education, support and counseling about physical activity/exercise needs.;Develop an individualized exercise prescription for aerobic and resistive training based on initial evaluation findings, risk stratification, comorbidities and participant's personal goals.   Expected Outcomes Achievement of increased cardiorespiratory fitness and enhanced flexibility, muscular endurance and strength shown through measurements of functional capacity and personal statement of participant.   Increase Strength and Stamina Yes   Intervention Provide advice, education, support and counseling about physical activity/exercise needs.;Develop an individualized exercise prescription for aerobic and resistive training based on initial evaluation findings, risk stratification, comorbidities and participant's personal goals.   Expected Outcomes Achievement of increased cardiorespiratory fitness and enhanced flexibility, muscular endurance and strength shown through measurements of functional capacity and personal statement of participant.   Improve shortness of breath with ADL's Yes   Intervention Provide education, individualized exercise plan and daily activity instruction to help decrease symptoms of SOB with activities of daily living.   Expected Outcomes Short Term: Achieves a reduction of symptoms when performing activities of daily living.   Develop more efficient breathing techniques such as purse lipped breathing and diaphragmatic breathing; and practicing self-pacing with activity Yes   Intervention Provide education, demonstration and support about specific breathing techniuqes utilized for more efficient breathing. Include techniques such as pursed lipped breathing, diaphragmatic breathing and self-pacing activity.   Expected Outcomes  Short Term: Participant will be able to demonstrate and use breathing techniques as needed throughout daily activities.   Increase knowledge of respiratory medications and ability to use respiratory devices properly  Yes   Intervention Provide education and demonstration as needed of appropriate use of medications, inhalers, and oxygen therapy.  Duoneb SVN, Symbicort, Flutter Valve, Incentive Spirometer   Expected Outcomes Short Term: Achieves understanding of medications use. Understands that oxygen is a medication prescribed by physician. Demonstrates appropriate use of inhaler and oxygen therapy.   Diabetes Yes   Intervention Provide education about signs/symptoms and action to take for hypo/hyperglycemia.;Provide education about proper nutrition, including hydration, and aerobic/resistive exercise prescription along with prescribed medications to achieve blood glucose in normal ranges: Fasting glucose 65-99 mg/dL   Expected Outcomes Short Term: Participant verbalizes understanding of the signs/symptoms and immediate care of hyper/hypoglycemia, proper foot care and importance of medication, aerobic/resistive exercise and nutrition plan for blood glucose control.;Long Term: Attainment of HbA1C < 7%.   Heart Failure Yes   Intervention Provide a combined exercise and nutrition program that is supplemented with education, support and counseling about heart failure. Directed toward relieving symptoms such as shortness of breath, decreased exercise tolerance, and extremity edema.   Expected Outcomes Improve functional capacity of life;Short term: Attendance in program 2-3 days a week with increased exercise capacity. Reported lower sodium intake. Reported increased fruit and vegetable intake. Reports medication compliance.;Short term: Daily weights obtained and reported for increase. Utilizing diuretic protocols set by physician.;Long term: Adoption of self-care skills and reduction of barriers for early  signs and symptoms recognition and intervention leading to self-care maintenance.   Hypertension Yes   Intervention Provide education on lifestyle modifcations including regular physical activity/exercise, weight management, moderate sodium restriction and increased consumption of fresh fruit, vegetables, and low fat dairy, alcohol moderation, and smoking cessation.;Monitor prescription use compliance.   Expected Outcomes Short Term: Continued assessment and intervention until BP is < 140/80m HG in hypertensive participants. < 130/843mHG in hypertensive participants with diabetes, heart failure or chronic kidney disease.;Long Term:  Maintenance of blood pressure at goal levels.   Lipids Yes   Intervention Provide education and support for participant on nutrition & aerobic/resistive exercise along with prescribed medications to achieve LDL <77m, HDL >466m   Expected Outcomes Short Term: Participant states understanding of desired cholesterol values and is compliant with medications prescribed. Participant is following exercise prescription and nutrition guidelines.;Long Term: Cholesterol controlled with medications as prescribed, with individualized exercise RX and with personalized nutrition plan. Value goals: LDL < 7060mHDL > 40 mg.      Core Components/Risk Factors/Patient Goals Review:      Goals and Risk Factor Review    Row Name 03/10/16 1143 03/21/16 1147 04/14/16 1146         Core Components/Risk Factors/Patient Goals Review   Personal Goals Review Develop more efficient breathing techniques such as purse lipped breathing and diaphragmatic breathing and practicing self-pacing with activity. Weight Management/Obesity;Improve shortness of breath with ADL's;Heart Failure Sedentary;Improve shortness of breath with ADL's;Increase Strength and Stamina;Develop more efficient breathing techniques such as purse lipped breathing and diaphragmatic breathing and practicing self-pacing with  activity.     Review Pursed lip breathing was reviewed with the patient. She demonstrated understanding of this breathing technique.  Damaris has not been monitoring her weight as she does not have a scale at home.  Daily and weekly numbers were reviewed as well as signs of CHF.  She has felt tired since starting LW but feels practicing PLB helps her breathe better. Nyelle states she can get around a little better since beginning exercise.  Her weight has been stable and even down a few pounds since beginning Lun[place]GregorShe still feels walking the TM is hard breathing wise, so we reviewed PLB and she was able to walk 1:30 on the TM 2 times.     Expected Outcomes Patient will use PLB to with exercise and ADL's to help control SOB and improve O2 Sats.  Staff will check on getting her a scale throught the charitable [employer] to monitor weight.  Regular participation in LW [place] help Kyisha increase oveall stamina and better manage her CHF. Samanda will continue to improve her stamina.        Core Components/Risk Factors/Patient Goals at Discharge (Final Review):      Goals and Risk Factor Review - 04/14/16 1146      Core Components/Risk Factors/Patient Goals Review   Personal Goals Review Sedentary;Improve shortness of breath with ADL's;Increase Strength and Stamina;Develop more efficient breathing techniques such as purse lipped breathing and diaphragmatic breathing and practicing self-pacing with activity.   Review Murl states she can get around a little better since beginning exercise.  Her weight has been stable and even down a few pounds since beginning LunOwanecoShe still feels walking the TM is hard breathing wise, so we reviewed PLB and she was able to walk 1:30 on the TM 2 times.   Expected Outcomes Hadyn will continue to improve her stamina.      ITP Comments:   Comments: 30 Day Note Review

## 2016-05-07 DIAGNOSIS — I5032 Chronic diastolic (congestive) heart failure: Secondary | ICD-10-CM

## 2016-05-07 DIAGNOSIS — I509 Heart failure, unspecified: Secondary | ICD-10-CM | POA: Diagnosis not present

## 2016-05-07 NOTE — Progress Notes (Signed)
Daily Session Note  Patient Details  Name: Amy [name] MRN: [id number] Date of Birth: Jun 25, 1927 Referring Provider:   Flowsheet Row Pulmonary Rehab from 03/04/2016 in [facility] Cardiac and Pulmonary Rehab  Referring Provider  Gollan      Encounter Date: 05/07/2016  Check In:     Session Check In - 05/07/16 1214      Check-In   Location [facility]-Cardiac & Pulmonary Rehab   Staff Present Carson Myrtle, BS, RRT, Respiratory Dareen Piano, BA, ACSM CEP, Exercise Physiologist;Other  Darel Hong   Supervising physician immediately available to respond to emergencies [facility] immediately available ER MD   Physician(s) Marcelene Butte and Darl Householder   Medication changes reported     No   Fall or balance concerns reported    No   Warm-up and Cool-down Performed as group-led Location manager Performed Yes   VAD Patient? No         History  Smoking Status  . Never Smoker  Smokeless Tobacco  . Never Used    Goals Met:  Proper associated with RPD/PD & O2 Sat Independence with exercise equipment Exercise tolerated well Strength training completed today  Goals Unmet:  Not Applicable  Comments: Pt able to follow exercise prescription today without complaint.  Will continue to monitor for progression.    Dr. Emily Filbert is Medical Director for [place] and [facility].

## 2016-05-09 DIAGNOSIS — I509 Heart failure, unspecified: Secondary | ICD-10-CM | POA: Diagnosis not present

## 2016-05-09 DIAGNOSIS — I5032 Chronic diastolic (congestive) heart failure: Secondary | ICD-10-CM

## 2016-05-09 NOTE — Progress Notes (Signed)
Daily Session Note  Patient Details  Name: Amy [name] MRN: [id number] Date of Birth: 07/06/1927 Referring Provider:     Pulmonary Rehab from 03/04/2016 in [facility] Cardiac and Pulmonary Rehab  Referring Provider  Gollan      Encounter Date: 05/09/2016  Check In:     Session Check In - 05/09/16 1034      Check-In   Location [facility]-Cardiac & Pulmonary Rehab   Staff Present Amanda Sommer, BA, ACSM CEP, Exercise Physiologist;  RN BSN   Supervising physician immediately available to respond to emergencies [facility] immediately available ER MD   Physician(s) Drs. Lord and Veronese   Medication changes reported     No   Fall or balance concerns reported    No   Tobacco Cessation No Change   Warm-up and Cool-down Performed as group-led instruction   Resistance Training Performed Yes   VAD Patient? No     Pain Assessment   Currently in Pain? No/denies   Multiple Pain Sites No         History  Smoking Status  . Never Smoker  Smokeless Tobacco  . Never Used    Goals Met:  Proper associated with RPD/PD & O2 Sat Independence with exercise equipment Exercise tolerated well Strength training completed today  Goals Unmet:  Not Applicable  Comments: Pt able to follow exercise prescription today without complaint.  Will continue to monitor for progression.    Dr. Mark Miller is Medical Director for [facility] and [facility]. 

## 2016-05-12 ENCOUNTER — Encounter: Payer: Self-pay | Admitting: Podiatry

## 2016-05-12 ENCOUNTER — Encounter: Payer: Medicare Other | Admitting: *Deleted

## 2016-05-12 ENCOUNTER — Ambulatory Visit (INDEPENDENT_AMBULATORY_CARE_PROVIDER_SITE_OTHER): Payer: Medicare Other

## 2016-05-12 ENCOUNTER — Ambulatory Visit (INDEPENDENT_AMBULATORY_CARE_PROVIDER_SITE_OTHER): Payer: Medicare Other | Admitting: Podiatry

## 2016-05-12 VITALS — BP 134/76 | HR 88 | Resp 16

## 2016-05-12 DIAGNOSIS — E119 Type 2 diabetes mellitus without complications: Secondary | ICD-10-CM

## 2016-05-12 DIAGNOSIS — M2042 Other hammer toe(s) (acquired), left foot: Secondary | ICD-10-CM

## 2016-05-12 DIAGNOSIS — Z0189 Encounter for other specified special examinations: Secondary | ICD-10-CM

## 2016-05-12 DIAGNOSIS — I5032 Chronic diastolic (congestive) heart failure: Secondary | ICD-10-CM

## 2016-05-12 DIAGNOSIS — B351 Tinea unguium: Secondary | ICD-10-CM | POA: Diagnosis not present

## 2016-05-12 DIAGNOSIS — M2041 Other hammer toe(s) (acquired), right foot: Secondary | ICD-10-CM

## 2016-05-12 DIAGNOSIS — M79676 Pain in unspecified toe(s): Secondary | ICD-10-CM | POA: Diagnosis not present

## 2016-05-12 DIAGNOSIS — I509 Heart failure, unspecified: Secondary | ICD-10-CM | POA: Diagnosis not present

## 2016-05-12 NOTE — Progress Notes (Signed)
Daily Session Note  Patient Details  Name: Amy [name] MRN: [id number] Date of Birth: 04-03-1927 Referring Provider:     Pulmonary Rehab from 03/04/2016 in [facility] Cardiac and Pulmonary Rehab  Referring Provider  Gollan      Encounter Date: 05/12/2016  Check In:     Session Check In - 05/12/16 1123      Check-In   Location [facility]-Cardiac & Pulmonary Rehab   Staff Present Earlean Shawl, BS, ACSM CEP, Exercise Physiologist;Laureen Owens Shark, BS, RRT, Respiratory Dareen Piano, BA, ACSM CEP, Exercise Physiologist   Physician(s) Reita Cliche and Alfred Levins   Medication changes reported     No   Fall or balance concerns reported    No   Warm-up and Cool-down Performed as group-led Location manager Performed Yes   VAD Patient? No     Pain Assessment   Currently in Pain? No/denies   Multiple Pain Sites No         History  Smoking Status  . Never Smoker  Smokeless Tobacco  . Never Used    Goals Met:  Proper associated with RPD/PD & O2 Sat Independence with exercise equipment Exercise tolerated well Strength training completed today  Goals Unmet:  Not Applicable  Comments: Pt able to follow exercise prescription today without complaint.  Will continue to monitor for progression.    Dr. Emily Filbert is Medical Director for [place] and [facility].

## 2016-05-12 NOTE — Progress Notes (Signed)
   Subjective:    Patient ID: Amy [name], female    DOB: 1928-01-11, 83 y.o.   MRN: [id number]  HPI: She presents today requesting debridement of painful elongated toenails. She does not recall her last A1c.  Review of Systems  Eyes: Positive for visual disturbance.  Cardiovascular: Positive for leg swelling.  Endocrine: Positive for polydipsia.  Genitourinary: Positive for frequency.  Musculoskeletal: Positive for arthralgias and gait problem.  Hematological: Bruises/bleeds easily.  All other systems reviewed and are negative.      Objective:   Physical Exam: Vital signs are stable she is alert and oriented 3 pulses are strongly palpable. Neurologic sensorium is intact. Deep tendon reflexes are intact. Muscle strength is 5 over 5 dorsiflexion plantar flexors and inverters everters on physical musculatures intact. Orthopedic evaluation demonstrates all joints distal to the ankle for range of motion or crepitation. Cutaneous evaluation demonstrates supple well-hydrated cutis toenails are long thick yellow dystrophic onychomycotic sharply incurvated. She does have hammertoe deformities regress taken today confirm hammertoe deformities with some osteoarthritic changes in major fractures are identified.    Assessment & Plan:  Pain in limb secondary onychomycosis and diabetes.  Plan: Debridement of toenails 1 through 5 bilateral.

## 2016-05-16 ENCOUNTER — Encounter: Payer: Medicare Other | Admitting: *Deleted

## 2016-05-16 DIAGNOSIS — I509 Heart failure, unspecified: Secondary | ICD-10-CM | POA: Diagnosis not present

## 2016-05-16 DIAGNOSIS — I5032 Chronic diastolic (congestive) heart failure: Secondary | ICD-10-CM

## 2016-05-16 NOTE — Progress Notes (Signed)
Daily Session Note  Patient Details  Name: Amy [name] MRN: [id number] Date of Birth: 07-15-1927 Referring Provider:     Pulmonary Rehab from 03/04/2016 in [facility] Cardiac and Pulmonary Rehab  Referring Provider  Gollan      Encounter Date: 05/16/2016  Check In:     Session Check In - 05/16/16 1046      Check-In   Location [facility]-Cardiac & Pulmonary Rehab   Staff Present Nyoka Cowden, RN, BSN, [place]den Field, BS, RRT, Respiratory Therapist;Other  Darel Hong, RN BSN   Physician(s) Drs. Alfred Levins and [place]   Medication changes reported     No   Fall or balance concerns reported    No   Tobacco Cessation No Change   Warm-up and Cool-down Performed as group-led instruction   Resistance Training Performed Yes     Pain Assessment   Currently in Pain? No/denies   Multiple Pain Sites No         History  Smoking Status  . Never Smoker  Smokeless Tobacco  . Never Used    Goals Met:  Proper associated with RPD/PD & O2 Sat Independence with exercise equipment Using PLB without cueing & demonstrates good technique Exercise tolerated well Strength training completed today  Goals Unmet:  Not Applicable  Comments: Pt able to follow exercise prescription today without complaint.  Will continue to monitor for progression.    Dr. Emily Filbert is Medical Director for [place] and [facility].

## 2016-05-19 ENCOUNTER — Encounter: Payer: Medicare Other | Admitting: *Deleted

## 2016-05-19 DIAGNOSIS — I5032 Chronic diastolic (congestive) heart failure: Secondary | ICD-10-CM

## 2016-05-19 DIAGNOSIS — I509 Heart failure, unspecified: Secondary | ICD-10-CM | POA: Diagnosis not present

## 2016-05-19 NOTE — Progress Notes (Signed)
Daily Session Note  Patient Details  Name: Amy [name] MRN: [id number] Date of Birth: Jun 29, 1927 Referring Provider:     Pulmonary Rehab from 03/04/2016 in [facility] Cardiac and Pulmonary Rehab  Referring Provider  Gollan      Encounter Date: 05/19/2016  Check In:     Session Check In - 05/19/16 1019      Check-In   Location [facility]-Cardiac & Pulmonary Rehab   Staff Present Carson Myrtle, BS, RRT, Respiratory Dareen Piano, BA, ACSM CEP, Exercise Physiologist;Vivian Okelley Amedeo Plenty, BS, ACSM CEP, Exercise Physiologist   Supervising physician immediately available to respond to emergencies [facility] immediately available ER MD   Physician(s) Clearnce Hasten and Jimmye Norman   Medication changes reported     No   Fall or balance concerns reported    No   Warm-up and Cool-down Performed as group-led instruction   Resistance Training Performed Yes   VAD Patient? No     Pain Assessment   Currently in Pain? No/denies   Multiple Pain Sites No         History  Smoking Status  . Never Smoker  Smokeless Tobacco  . Never Used    Goals Met:  Proper associated with RPD/PD & O2 Sat Independence with exercise equipment Exercise tolerated well Strength training completed today  Goals Unmet:  Not Applicable  Comments: Pt able to follow exercise prescription today without complaint.  Will continue to monitor for progression.    Dr. Emily Filbert is Medical Director for [place] and [facility].

## 2016-05-21 DIAGNOSIS — I509 Heart failure, unspecified: Secondary | ICD-10-CM | POA: Diagnosis not present

## 2016-05-21 DIAGNOSIS — I5032 Chronic diastolic (congestive) heart failure: Secondary | ICD-10-CM

## 2016-05-21 NOTE — Progress Notes (Signed)
Daily Session Note  Patient Details  Name: Amy [name] MRN: [id number] Date of Birth: 1927-09-19 Referring Provider:     Pulmonary Rehab from 03/04/2016 in [facility] Cardiac and Pulmonary Rehab  Referring Provider  Gollan      Encounter Date: 05/21/2016  Check In:     Session Check In - 05/21/16 1247      Check-In   Location [facility]-Cardiac & Pulmonary Rehab   Staff Present Carson Myrtle, BS, RRT, Respiratory Lennie Hummer, MA, ACSM RCEP, Exercise Physiologist;Amanda Oletta Darter, BA, ACSM CEP, Exercise Physiologist   Supervising physician immediately available to respond to emergencies [facility] immediately available ER MD   Physician(s) Irish Lack and Jimmye Norman   Medication changes reported     No   Fall or balance concerns reported    No   Warm-up and Cool-down Performed as group-led instruction   Resistance Training Performed Yes   VAD Patient? No     Pain Assessment   Currently in Pain? No/denies           Exercise Prescription Changes - 05/21/16 1200      Response to Exercise   Blood Pressure (Admit) 146/64   Blood Pressure (Exercise) 160/64   Blood Pressure (Exit) 130/62   Heart Rate (Admit) 90 bpm   Heart Rate (Exercise) 95 bpm   Heart Rate (Exit) 96 bpm   Oxygen Saturation (Admit) 100 %   Oxygen Saturation (Exercise) 97 %   Oxygen Saturation (Exit) 99 %   Rating of Perceived Exertion (Exercise) 15   Perceived Dyspnea (Exercise) 3   Duration Progress to 45 minutes of aerobic exercise without signs/symptoms of physical distress   Intensity THRR unchanged     Progression   Progression Continue to progress workloads to maintain intensity without signs/symptoms of physical distress.     Resistance Training   Training Prescription Yes   Weight 2   Reps 10-15     NuStep   Level 5   Minutes 15   METs 1.6     T5 Nustep   Level 1   Minutes 15     Track   Laps 6      History  Smoking Status  . Never Smoker  Smokeless Tobacco  . Never  Used    Goals Met:  Proper associated with RPD/PD & O2 Sat Independence with exercise equipment Exercise tolerated well Strength training completed today  Goals Unmet:  Not Applicable  Comments: Pt able to follow exercise prescription today without complaint.  Will continue to monitor for progression.    Dr. Emily Filbert is Medical Director for [place] and [facility].

## 2016-05-26 ENCOUNTER — Other Ambulatory Visit: Payer: Self-pay | Admitting: *Deleted

## 2016-05-26 ENCOUNTER — Encounter: Payer: Medicare Other | Attending: Cardiovascular Disease | Admitting: *Deleted

## 2016-05-26 DIAGNOSIS — I509 Heart failure, unspecified: Secondary | ICD-10-CM | POA: Insufficient documentation

## 2016-05-26 DIAGNOSIS — D500 Iron deficiency anemia secondary to blood loss (chronic): Secondary | ICD-10-CM

## 2016-05-26 DIAGNOSIS — I5032 Chronic diastolic (congestive) heart failure: Secondary | ICD-10-CM

## 2016-05-26 NOTE — Progress Notes (Signed)
Daily Session Note  Patient Details  Name: Amy [name] MRN: [id number] Date of Birth: Sep 29, 1927 Referring Provider:     Pulmonary Rehab from 03/04/2016 in [facility] Cardiac and Pulmonary Rehab  Referring Provider  Gollan      Encounter Date: 05/26/2016  Check In:     Session Check In - 05/26/16 1218      Check-In   Location [facility]-Cardiac & Pulmonary Rehab   Staff Present Nada Maclachlan, BA, ACSM CEP, Exercise Physiologist;Laureen Owens Shark, BS, RRT, Respiratory Bertis Ruddy, BS, ACSM CEP, Exercise Physiologist   Supervising physician immediately available to respond to emergencies [facility] immediately available ER MD   Physician(s) Quentin Cornwall and Jimmye Norman    Medication changes reported     No   Fall or balance concerns reported    No   Warm-up and Cool-down Performed as group-led instruction   Resistance Training Performed Yes   VAD Patient? No     Pain Assessment   Multiple Pain Sites No         History  Smoking Status  . Never Smoker  Smokeless Tobacco  . Never Used    Goals Met:  Proper associated with RPD/PD & O2 Sat Independence with exercise equipment Exercise tolerated well Strength training completed today  Goals Unmet:  Not Applicable  Comments: Pt able to follow exercise prescription today without complaint.  Will continue to monitor for progression.    Dr. Emily Filbert is Medical Director for [place] and [facility].

## 2016-05-27 ENCOUNTER — Inpatient Hospital Stay: Payer: Medicare Other | Attending: Oncology

## 2016-05-27 DIAGNOSIS — E039 Hypothyroidism, unspecified: Secondary | ICD-10-CM | POA: Insufficient documentation

## 2016-05-27 DIAGNOSIS — I10 Essential (primary) hypertension: Secondary | ICD-10-CM | POA: Insufficient documentation

## 2016-05-27 DIAGNOSIS — Z79899 Other long term (current) drug therapy: Secondary | ICD-10-CM | POA: Diagnosis not present

## 2016-05-27 DIAGNOSIS — I129 Hypertensive chronic kidney disease with stage 1 through stage 4 chronic kidney disease, or unspecified chronic kidney disease: Secondary | ICD-10-CM | POA: Diagnosis not present

## 2016-05-27 DIAGNOSIS — R0602 Shortness of breath: Secondary | ICD-10-CM | POA: Diagnosis not present

## 2016-05-27 DIAGNOSIS — R5383 Other fatigue: Secondary | ICD-10-CM | POA: Diagnosis not present

## 2016-05-27 DIAGNOSIS — Z7982 Long term (current) use of aspirin: Secondary | ICD-10-CM | POA: Insufficient documentation

## 2016-05-27 DIAGNOSIS — Z803 Family history of malignant neoplasm of breast: Secondary | ICD-10-CM | POA: Diagnosis not present

## 2016-05-27 DIAGNOSIS — R531 Weakness: Secondary | ICD-10-CM | POA: Insufficient documentation

## 2016-05-27 DIAGNOSIS — Z7984 Long term (current) use of oral hypoglycemic drugs: Secondary | ICD-10-CM | POA: Insufficient documentation

## 2016-05-27 DIAGNOSIS — N183 Chronic kidney disease, stage 3 (moderate): Secondary | ICD-10-CM | POA: Insufficient documentation

## 2016-05-27 DIAGNOSIS — I5032 Chronic diastolic (congestive) heart failure: Secondary | ICD-10-CM | POA: Diagnosis not present

## 2016-05-27 DIAGNOSIS — D500 Iron deficiency anemia secondary to blood loss (chronic): Secondary | ICD-10-CM | POA: Diagnosis not present

## 2016-05-27 DIAGNOSIS — E669 Obesity, unspecified: Secondary | ICD-10-CM | POA: Diagnosis not present

## 2016-05-27 DIAGNOSIS — K449 Diaphragmatic hernia without obstruction or gangrene: Secondary | ICD-10-CM | POA: Insufficient documentation

## 2016-05-27 DIAGNOSIS — I2510 Atherosclerotic heart disease of native coronary artery without angina pectoris: Secondary | ICD-10-CM | POA: Insufficient documentation

## 2016-05-27 DIAGNOSIS — E119 Type 2 diabetes mellitus without complications: Secondary | ICD-10-CM | POA: Insufficient documentation

## 2016-05-27 LAB — CBC WITH DIFFERENTIAL/PLATELET
BASOS ABS: 0 10*3/uL (ref 0–0.1)
BASOS PCT: 0 %
EOS PCT: 3 %
Eosinophils Absolute: 0.2 10*3/uL (ref 0–0.7)
HCT: 30.6 % — ABNORMAL LOW (ref 35.0–47.0)
Hemoglobin: 10.1 g/dL — ABNORMAL LOW (ref 12.0–16.0)
Lymphocytes Relative: 26 %
Lymphs Abs: 1.6 10*3/uL (ref 1.0–3.6)
MCH: 29 pg (ref 26.0–34.0)
MCHC: 33.1 g/dL (ref 32.0–36.0)
MCV: 87.5 fL (ref 80.0–100.0)
Monocytes Absolute: 0.6 10*3/uL (ref 0.2–0.9)
Monocytes Relative: 10 %
NEUTROS ABS: 3.7 10*3/uL (ref 1.4–6.5)
Neutrophils Relative %: 61 %
PLATELETS: 232 10*3/uL (ref 150–440)
RBC: 3.5 MIL/uL — AB (ref 3.80–5.20)
RDW: 15.1 % — ABNORMAL HIGH (ref 11.5–14.5)
WBC: 6.2 10*3/uL (ref 3.6–11.0)

## 2016-05-27 LAB — FERRITIN: Ferritin: 15 ng/mL (ref 11–307)

## 2016-05-27 LAB — IRON AND TIBC
IRON: 41 ug/dL (ref 28–170)
SATURATION RATIOS: 13 % (ref 10.4–31.8)
TIBC: 327 ug/dL (ref 250–450)
UIBC: 286 ug/dL

## 2016-05-27 LAB — SAMPLE TO BLOOD BANK

## 2016-05-28 ENCOUNTER — Ambulatory Visit: Payer: Medicare Other

## 2016-05-28 ENCOUNTER — Other Ambulatory Visit: Payer: Medicare Other

## 2016-05-28 ENCOUNTER — Ambulatory Visit: Payer: Medicare Other | Admitting: Oncology

## 2016-05-28 NOTE — Progress Notes (Signed)
[place]  Telephone:([phone]) [phone] Fax:(336) [phone]  ID: [name] OB: 1927-02-25  MR#: [id number]  SAY#:[id number]  Patient Care Team: Leonel Ramsay, MD as PCP - General (Infectious Diseases) Minna Merritts, MD as Consulting Physician (Cardiology)  CHIEF COMPLAINT: Iron deficiency anemia secondary to chronic blood loss.  INTERVAL HISTORY: Patient returns to clinic for repeat laboratory work and further evaluation. She has had worsening shortness of breath over the past several weeks. She also has chronic weakness and fatigue that is unchanged. She has no neurologic complaints. She denies any recent fevers or illnesses. She denies any chest pain. She denies any nausea, vomiting, constipation, or diarrhea. She has no urinary complaints. Patient offers no further specific complaints today.  REVIEW OF SYSTEMS:   Review of Systems  Constitutional: Positive for malaise/fatigue. Negative for fever and weight loss.  HENT: Negative.   Respiratory: Positive for shortness of breath. Negative for cough and hemoptysis.   Cardiovascular: Negative.  Negative for chest pain and leg swelling.  Gastrointestinal: Negative.  Negative for abdominal pain, blood in stool and melena.  Genitourinary: Negative.  Negative for hematuria.  Musculoskeletal: Negative.   Neurological: Positive for weakness.  Psychiatric/Behavioral: Negative.  The patient is not nervous/anxious.     As per HPI. Otherwise, a complete review of systems is negative.  PAST MEDICAL HISTORY: Past Medical History:  Diagnosis Date  . Bladder incontinence   . CAD (coronary artery disease)    a. cardiac cath 05/2009: proximal LAD 90% stenosis s/p PCI/Xience 2.75 x 12 mm DES, mid LAD 40%, diagonal 40%, proximal LCx 40% followed by 40% LCx lesion, 30%-40%-30% lesion noted in the RCA with distal RCA 30% and 25% lesions  . Chronic diastolic (congestive) heart failure    a. Lexiscan 2013: no ischemia,  normal EF b. echo 09/2015: EF 55-60% w/ Grade 1 DD  . CKD (chronic kidney disease) stage 3, GFR 30-59 ml/min   . Degenerative arthritis of knee    bilateral knees  . Diabetes mellitus    Type II  . Hiatal hernia   . Hypertension   . Iron deficiency   . Menopausal symptoms   . Morbid obesity ([place])   . Thyroid disease    hypothyroidism    PAST SURGICAL HISTORY: Past Surgical History:  Procedure Laterality Date  . COLONOSCOPY  2015  . REPLACEMENT TOTAL KNEE BILATERAL    . TOTAL VAGINAL HYSTERECTOMY     ovarian mass, not cancerous  . UPPER GI ENDOSCOPY  2015    FAMILY HISTORY Family History  Problem Relation Age of Onset  . Breast cancer Mother 47       ADVANCED DIRECTIVES:    HEALTH MAINTENANCE: Social History  Substance Use Topics  . Smoking status: Never Smoker  . Smokeless tobacco: Never Used  . Alcohol use No     Colonoscopy:  PAP:  Bone density:  Lipid panel:  Allergies  Allergen Reactions  . Atenolol     Other reaction(s): Other (See Comments) Decreased heart rate  . Codeine     Rash, difficulty breathing, nausea.  . Rofecoxib     Other reaction(s): Unknown  . Sulfa Antibiotics     Other reaction(s): Unknown    Current Outpatient Prescriptions  Medication Sig Dispense Refill  . aspirin 81 MG tablet Take 81 mg by mouth daily.    . cloNIDine (CATAPRES - DOSED IN MG/24 HR) 0.3 mg/24hr patch Place 0.3 mg onto the skin once a week.     Marland Kitchen  clopidogrel (PLAVIX) 75 MG tablet Take 1 tablet (75 mg total) by mouth daily. 90 tablet 3  . doxazosin (CARDURA) 8 MG tablet Take 1 tablet (8 mg total) by mouth 2 (two) times daily. 180 tablet 3  . famotidine (PEPCID) 20 MG tablet Take 1 tablet (20 mg total) by mouth 2 (two) times daily. 180 tablet 1  . glimepiride (AMARYL) 4 MG tablet Take 4 mg by mouth daily before breakfast.    . ipratropium-albuterol (DUONEB) 0.5-2.5 (3) MG/3ML SOLN Take 3 mLs by nebulization every 4 (four) hours as needed (shortness of  breath/wheezing.). 360 mL   . levothyroxine (SYNTHROID, LEVOTHROID) 125 MCG tablet Take 125 mcg by mouth daily.    Marland Kitchen losartan-hydrochlorothiazide (HYZAAR) 100-25 MG per tablet Take 1 tablet by mouth daily.      . metFORMIN (GLUCOPHAGE) 1000 MG tablet Take 1,000 mg by mouth 2 (two) times daily with a meal.    . Multiple Vitamins-Minerals (PRESERVISION AREDS PO) Take by mouth 2 (two) times daily.    Marland Kitchen oxybutynin (DITROPAN) 5 MG tablet Take 5 mg by mouth 1 dose over 46 hours.      . pantoprazole (PROTONIX) 40 MG tablet Take 40 mg by mouth daily.     Marland Kitchen senna (SENOKOT) 8.6 MG TABS tablet Take 1 tablet (8.6 mg total) by mouth daily as needed for mild constipation. 120 each 0  . simvastatin (ZOCOR) 40 MG tablet Take 1 tablet (40 mg total) by mouth at bedtime. (Patient taking differently: Take 40 mg by mouth daily. ) 90 tablet 3  . SYMBICORT 160-4.5 MCG/ACT inhaler Inhale 2 puffs into the lungs 2 (two) times daily.     . fluticasone (FLONASE) 50 MCG/ACT nasal spray Place into the nose.     No current facility-administered medications for this visit.     OBJECTIVE: Vitals:   05/29/16 1124  BP: (!) 143/70  Pulse: 67  Resp: 20  Temp: (!) 96.9 F (36.1 C)     Body mass index is 43.22 kg/m.    ECOG FS:2 - Symptomatic, <50% confined to bed  General: Well-developed, well-nourished, no acute distress. Eyes: Pink conjunctiva, anicteric sclera. Lungs: Scattered wheezing. Heart: Regular rate and rhythm. No rubs, murmurs, or gallops. Abdomen: Soft, nontender, nondistended. No organomegaly noted, normoactive bowel sounds. Musculoskeletal: No edema, cyanosis, or clubbing. Neuro: Alert, answering all questions appropriately. Cranial nerves grossly intact. Skin: No rashes or petechiae noted. Psych: Normal affect.  LAB RESULTS:  Lab Results  Component Value Date   NA 138 10/22/2015   K 4.1 10/22/2015   CL 102 10/22/2015   CO2 26 10/22/2015   GLUCOSE 177 (H) 10/22/2015   BUN 43 (H) 10/22/2015    CREATININE 1.48 (H) 10/22/2015   CALCIUM 8.9 10/22/2015   PROT 5.4 (L) 10/22/2015   ALBUMIN 3.5 10/22/2015   AST 15 10/22/2015   ALT 17 10/22/2015   ALKPHOS 60 10/22/2015   BILITOT 0.5 10/22/2015   GFRNONAA 31 (L) 10/22/2015   GFRAA 36 (L) 10/22/2015    Lab Results  Component Value Date   WBC 6.2 05/27/2016   NEUTROABS 3.7 05/27/2016   HGB 9.9 (L) 05/29/2016   HCT 30.6 (L) 05/27/2016   MCV 87.5 05/27/2016   PLT 232 05/27/2016   Lab Results  Component Value Date   IRON 41 05/27/2016   TIBC 327 05/27/2016   IRONPCTSAT 13 05/27/2016    Lab Results  Component Value Date   FERRITIN 15 05/27/2016    STUDIES: Dg Foot Complete Left  Result Date: 05/12/2016 Please see detailed radiograph report in office note.  Dg Foot Complete Right  Result Date: 05/12/2016 Please see detailed radiograph report in office note.   ASSESSMENT:  Iron deficiency anemia secondary to chronic blood loss, shortness of breath.   PLAN:    1. Iron deficiency anemia secondary to chronic blood loss: Previously, patient was reported to be intolerant of oral iron supplementation. She also had an EGD and colonoscopy in May 2015 for heme positive stools but only revealed esophagitis and tubular adenomas in her colon. Patient's hemoglobin has trended down and she is mildly symptomatic. Her iron stores are within normal limits, but are trending down and borderline. Proceed with one infusion of 510 mg IV Feraheme today. Return to clinic in 4 months with repeat laboratory work and further evaluation. 2. Shortness of breath:  Most likely cardiac in nature. Patient has been instructed to make a follow-up appointment with her cardiologist as soon as possible. 3. Renal insufficiency: Mild, monitor.  Patient expressed understanding and was in agreement with this plan. She also understands that She can call clinic at any time with any questions, concerns, or complaints.    Lloyd Huger, MD   06/01/2016 2:37  PM

## 2016-05-29 ENCOUNTER — Inpatient Hospital Stay: Payer: Medicare Other

## 2016-05-29 ENCOUNTER — Ambulatory Visit: Payer: Medicare Other | Admitting: Oncology

## 2016-05-29 ENCOUNTER — Inpatient Hospital Stay (HOSPITAL_BASED_OUTPATIENT_CLINIC_OR_DEPARTMENT_OTHER): Payer: Medicare Other | Admitting: Oncology

## 2016-05-29 ENCOUNTER — Ambulatory Visit: Payer: Medicare Other

## 2016-05-29 ENCOUNTER — Other Ambulatory Visit: Payer: Medicare Other

## 2016-05-29 VITALS — BP 143/70 | HR 67 | Temp 96.9°F | Resp 20 | Wt 225.0 lb

## 2016-05-29 DIAGNOSIS — Z7982 Long term (current) use of aspirin: Secondary | ICD-10-CM

## 2016-05-29 DIAGNOSIS — R531 Weakness: Secondary | ICD-10-CM

## 2016-05-29 DIAGNOSIS — E039 Hypothyroidism, unspecified: Secondary | ICD-10-CM

## 2016-05-29 DIAGNOSIS — Z7984 Long term (current) use of oral hypoglycemic drugs: Secondary | ICD-10-CM

## 2016-05-29 DIAGNOSIS — I2510 Atherosclerotic heart disease of native coronary artery without angina pectoris: Secondary | ICD-10-CM | POA: Diagnosis not present

## 2016-05-29 DIAGNOSIS — I129 Hypertensive chronic kidney disease with stage 1 through stage 4 chronic kidney disease, or unspecified chronic kidney disease: Secondary | ICD-10-CM | POA: Diagnosis not present

## 2016-05-29 DIAGNOSIS — D500 Iron deficiency anemia secondary to blood loss (chronic): Secondary | ICD-10-CM | POA: Diagnosis not present

## 2016-05-29 DIAGNOSIS — N183 Chronic kidney disease, stage 3 (moderate): Secondary | ICD-10-CM

## 2016-05-29 DIAGNOSIS — E119 Type 2 diabetes mellitus without complications: Secondary | ICD-10-CM | POA: Diagnosis not present

## 2016-05-29 DIAGNOSIS — K449 Diaphragmatic hernia without obstruction or gangrene: Secondary | ICD-10-CM

## 2016-05-29 DIAGNOSIS — E669 Obesity, unspecified: Secondary | ICD-10-CM

## 2016-05-29 DIAGNOSIS — R5383 Other fatigue: Secondary | ICD-10-CM | POA: Diagnosis not present

## 2016-05-29 DIAGNOSIS — R0602 Shortness of breath: Secondary | ICD-10-CM

## 2016-05-29 DIAGNOSIS — D509 Iron deficiency anemia, unspecified: Secondary | ICD-10-CM

## 2016-05-29 DIAGNOSIS — Z79899 Other long term (current) drug therapy: Secondary | ICD-10-CM | POA: Diagnosis not present

## 2016-05-29 DIAGNOSIS — I10 Essential (primary) hypertension: Secondary | ICD-10-CM

## 2016-05-29 DIAGNOSIS — I5032 Chronic diastolic (congestive) heart failure: Secondary | ICD-10-CM | POA: Diagnosis not present

## 2016-05-29 DIAGNOSIS — Z803 Family history of malignant neoplasm of breast: Secondary | ICD-10-CM

## 2016-05-29 LAB — HEMOGLOBIN: HEMOGLOBIN: 9.9 g/dL — AB (ref 12.0–16.0)

## 2016-05-29 MED ORDER — SODIUM CHLORIDE 0.9 % IV SOLN
510.0000 mg | Freq: Once | INTRAVENOUS | Status: AC
  Administered 2016-05-29: 510 mg via INTRAVENOUS
  Filled 2016-05-29: qty 17

## 2016-05-29 MED ORDER — SODIUM CHLORIDE 0.9 % IV SOLN
Freq: Once | INTRAVENOUS | Status: DC
  Filled 2016-05-29: qty 1000

## 2016-05-29 NOTE — Progress Notes (Signed)
Patient states she gives out of breath easily.  She has noticed over the last several days she gets dizzy.

## 2016-05-30 ENCOUNTER — Encounter: Payer: Medicare Other | Admitting: *Deleted

## 2016-05-30 DIAGNOSIS — I5032 Chronic diastolic (congestive) heart failure: Secondary | ICD-10-CM

## 2016-05-30 DIAGNOSIS — I509 Heart failure, unspecified: Secondary | ICD-10-CM | POA: Diagnosis not present

## 2016-05-30 NOTE — Progress Notes (Signed)
Daily Session Note  Patient Details  Name: Amy [name] MRN: [id number] Date of Birth: 1927/05/07 Referring Provider:     Pulmonary Rehab from 03/04/2016 in [facility] Cardiac and Pulmonary Rehab  Referring Provider  [place]      Encounter Date: 05/30/2016  Check In:     Session Check In - 05/30/16 1119      Check-In   Location [facility]-Cardiac & Pulmonary Rehab   Staff Present Gerlene Burdock, RN, Vickki Hearing, BA, ACSM CEP, Exercise Physiologist;Other  Darel Hong, RN   Supervising physician immediately available to respond to emergencies [facility] immediately available ER MD   Physician(s) Dr. Corky Downs and Dr. Mable Paris   Medication changes reported     No   Fall or balance concerns reported    Yes   Warm-up and Cool-down Performed on first and last piece of equipment   Resistance Training Performed Yes   VAD Patient? No     Pain Assessment   Currently in Pain? No/denies         History  Smoking Status  . Never Smoker  Smokeless Tobacco  . Never Used    Goals Met:  Proper associated with RPD/PD & O2 Sat Exercise tolerated well  Goals Unmet:  Not Applicable  Comments: I discussed today with [name] the option to exercise in the Care Class at 12:15pm on Tuesday and Thursdays. Her doctor who she sees in the [place] for her anemia is going to write the order to clear her to exercise in that class. 11:10-11:21    Dr. Emily Filbert is Medical Director for [place] and [facility].

## 2016-05-31 NOTE — Progress Notes (Signed)
Cardiology Office Note  Date:  06/02/2016   ID:  Amy [name], DOB 1928-01-24, MRN [id number]  PCP:  Leonel Ramsay, MD   Chief Complaint  Patient presents with  . other    6 month follow up. Meds reviewed by the pt. verbally. Pt. was at [facility] with decreased HR.     HPI:  Amy [name] is a pleasant 83 year old woman with a history of  coronary artery disease,  proximal LAD with stent  incontinence,  Beta blocker has been held in the past secondary to bradycardia.  lost her daughter, previous adjustment disorder Long history of poor diet COPD exacerbations morbid obesity who now presents for routine follow up of her coronary artery disease  Presenting today in a wheelchair Second day of lower ABD pain, Denies having pain on urination  hospitalization mid August 2017 COPD exacerbation, required steroids, DuoNeb's, flutter device, antibiotics CT scan showing mucus plugging, postobstructive atelectasis rehab, [facility] Now at home  Son in [place] helps her with her house chores  Weight continues to run high No significant leg swelling Takes Lasix occasionally, HCTZ daily  Reports having worsening SOB Review of lab work shows drop in her blood count Recently received iron infusion  Chronic baseline shortness of breath  Mention of bradycardia while she was at heart track on arrival, heart rate improved on exertion Rate 48 at rest asymptomatic  Labs reviewed today HBG 9.9, Previously 11.6  Other lab work reviewed with her Total chol 148 BMP in 09/2015, creatinine 1.2 BUN 23 HBA1C 6.4 Vit D 6.3 Total chol 156, LDL 74  EKG on today's visit shows normal sinus rhythm with rate 59 bpm, right bundle branch block  Other past medical history reviewed Echo 02/2015 EF 60%, mild AS  [facility] August 2015. She had diarrhea, vomiting, felt to be viral also with acute renal failure that improved with hydration. Creatinine reached 1.86, one month  later creatinine 1.0 as an outpatient Continues to be weak, does not walk much and is deconditioned  Guaiac positive and she had colonoscopy and EGD. She reports Dr. Vira Agar did not find anything Iron transfusions in the past under the direction of Dr. Ma Hillock.   Prior episode of shortness of breath while walking in the hospital to schedule a colonoscopy appointment. sent to the emergency room for further evaluation. Systolic pressure was in the 170 range. workup at that time was essentially normal and she was discharged home.  cardiac catheter [facility] at [facility] on June 22, 2009 details 90% proximal LAD, 40% mid LAD, 40% diagonal, 60% proximal circumflex followed by 40% circumflex lesion, 30%, 40% and 30% lesion noted in the RCA with distal RCA with 30% and 25% lesions. Mild atheroma in the aorta. Xience 2.75 x 12 mm Drug eluting stent placed.   stress test 01/14/2012 showing no ischemia, Lexiscan  PMH:   has a past medical history of Bladder incontinence; CAD (coronary artery disease); Chronic diastolic (congestive) heart failure; CKD (chronic kidney disease) stage 3, GFR 30-59 ml/min; Degenerative arthritis of knee; Diabetes mellitus; Hiatal hernia; Hypertension; Iron deficiency; Menopausal symptoms; Morbid obesity ([place]); and Thyroid disease.  PSH:    Past Surgical History:  Procedure Laterality Date  . COLONOSCOPY  2015  . REPLACEMENT TOTAL KNEE BILATERAL    . TOTAL VAGINAL HYSTERECTOMY     ovarian mass, not cancerous  . UPPER GI ENDOSCOPY  2015    Current Outpatient Prescriptions  Medication Sig Dispense Refill  . aspirin 81 MG tablet Take  81 mg by mouth daily.    . cloNIDine (CATAPRES - DOSED IN MG/24 HR) 0.3 mg/24hr patch Place 0.3 mg onto the skin once a week.     . clopidogrel (PLAVIX) 75 MG tablet Take 1 tablet (75 mg total) by mouth daily. 90 tablet 3  . doxazosin (CARDURA) 8 MG tablet Take 1 tablet (8 mg total) by mouth 2 (two) times daily. 180 tablet 3  . famotidine  (PEPCID) 20 MG tablet Take 1 tablet (20 mg total) by mouth 2 (two) times daily. 180 tablet 1  . fluticasone (FLONASE) 50 MCG/ACT nasal spray Place into the nose.    Marland Kitchen glimepiride (AMARYL) 4 MG tablet Take 4 mg by mouth daily before breakfast.    . ipratropium-albuterol (DUONEB) 0.5-2.5 (3) MG/3ML SOLN Take 3 mLs by nebulization every 4 (four) hours as needed (shortness of breath/wheezing.). 360 mL   . levothyroxine (SYNTHROID, LEVOTHROID) 125 MCG tablet Take 125 mcg by mouth daily.    Marland Kitchen losartan-hydrochlorothiazide (HYZAAR) 100-25 MG per tablet Take 1 tablet by mouth daily.      . metFORMIN (GLUCOPHAGE) 1000 MG tablet Take 1,000 mg by mouth 2 (two) times daily with a meal.    . Multiple Vitamins-Minerals (PRESERVISION AREDS PO) Take by mouth 2 (two) times daily.    Marland Kitchen oxybutynin (DITROPAN) 5 MG tablet Take 5 mg by mouth 1 dose over 46 hours.      . pantoprazole (PROTONIX) 40 MG tablet Take 40 mg by mouth daily.     Marland Kitchen senna (SENOKOT) 8.6 MG TABS tablet Take 1 tablet (8.6 mg total) by mouth daily as needed for mild constipation. 120 each 0  . simvastatin (ZOCOR) 40 MG tablet Take 1 tablet (40 mg total) by mouth at bedtime. (Patient taking differently: Take 40 mg by mouth daily. ) 90 tablet 3  . SYMBICORT 160-4.5 MCG/ACT inhaler Inhale 2 puffs into the lungs 2 (two) times daily.     . furosemide (LASIX) 20 MG tablet Take 1 tablet (20 mg total) by mouth 2 (two) times daily as needed. 180 tablet 3  . furosemide (LASIX) 20 MG tablet Take 1 tablet (20 mg total) by mouth daily as needed. 90 tablet 3   No current facility-administered medications for this visit.      Allergies:   Atenolol; Codeine; Rofecoxib; and Sulfa antibiotics   Social History:  The patient  reports that she has never smoked. She has never used smokeless tobacco. She reports that she does not drink alcohol or use drugs.   Family History:   family history includes Breast cancer (age of onset: 65) in her mother.    Review of  Systems: Review of Systems  Constitutional: Negative.   Respiratory: Negative.   Cardiovascular: Negative.   Gastrointestinal: Negative.   Musculoskeletal: Negative.   Neurological: Negative.   Psychiatric/Behavioral: Negative.   All other systems reviewed and are negative.    PHYSICAL EXAM: VS:  BP (!) 130/50 (BP Location: Left Arm, Patient Position: Sitting, Cuff Size: Normal)   Pulse (!) 59   Ht '5\' 2"'$  (1.575 m)   Wt 223 lb (101.2 kg)   BMI 40.79 kg/m  , BMI Body mass index is 40.79 kg/m. GEN: Well nourished, well developed, in no acute distress , Presents in a wheelchair, obese HEENT: normal  Neck: no JVD, carotid bruits, or masses Cardiac: RRR; no murmurs, rubs, or gallops,no edema  Respiratory:  clear to auscultation bilaterally, normal work of breathing GI: soft, nontender, nondistended, + BS MS:  no deformity or atrophy  Skin: warm and dry, no rash Neuro:  Strength and sensation are intact Psych: euthymic mood, full affect    Recent Labs: 10/01/2015: B Natriuretic Peptide 53.0; TSH 5.103 10/22/2015: ALT 17; BUN 43; Creatinine, Ser 1.48; Magnesium 2.1; Potassium 4.1; Sodium 138 05/27/2016: Platelets 232 05/29/2016: Hemoglobin 9.9    Lipid Panel Lab Results  Component Value Date   CHOL 135 06/25/2010   HDL 35 (L) 06/25/2010   LDLCALC 56 06/25/2010   TRIG 220 (H) 06/25/2010      Wt Readings from Last 3 Encounters:  06/02/16 223 lb (101.2 kg)  05/29/16 225 lb (102.1 kg)  03/04/16 229 lb (103.9 kg)       ASSESSMENT AND PLAN:  SOB (shortness of breath) - Plan: EKG 12-Lead Likely multifactorial including deconditioning, morbid obesity, chronic diastolic CHF, anemia Recent iron infusion should help with the anemia Recommended she increase Lasix up to 3 times per week Continue heart track Less likely ischemia given chronic nature of her symptoms Long discussion with her today that she needs to continue her exercise program Limited inability to lose  weight  Coronary artery disease of native artery of native heart with stable angina pectoris (Amy [name]) - Plan: EKG 12-Lead Currently with no symptoms of angina. No further workup at this time. Continue current medication regimen.If shortness of breath gets worse, could order stress testing  Essential hypertension - Plan: EKG 12-Lead Blood pressure is well controlled on today's visit. No changes made to the medications.  Chronic diastolic CHF (congestive heart failure) (Amy [name]) Recommended she increase Lasix. She is taking this once if not twice a week We will increase up to 3 times per week  New onset atrial fibrillation (Amy [name]) - Plan: EKG 12-Lead Maintaining normal sinus rhythm Not on anticoagulation  COPD exacerbation (HCC) - Plan: EKG 12-Lead Reports no COPD exacerbation   Acute renal failure superimposed on stage 3 chronic kidney disease, unspecified acute renal failure type (HCC) Improved renal function, previously elevated August 2017 likely from overdiuresis We'll need to monitor closely  Anemia, unspecified type - Plan: EKG 12-Lead Seen by Dr. Grayland Ormond, recent iron infusion  Bradycardia Asymptomatic, adequate blood pressure Likely secondary to clonidine patch, will monitor for now   Total encounter time more than 25 minutes  Greater than 50% was spent in counseling and coordination of care with the patient    Disposition:   F/U  6 months   Orders Placed This Encounter  Procedures  . EKG 12-Lead     Signed, Esmond Plants, M.D., Ph.D. 06/02/2016  [place], [place]

## 2016-06-02 ENCOUNTER — Ambulatory Visit (INDEPENDENT_AMBULATORY_CARE_PROVIDER_SITE_OTHER): Payer: Medicare Other | Admitting: Cardiovascular Disease

## 2016-06-02 ENCOUNTER — Encounter: Payer: Self-pay | Admitting: Respiratory Therapy

## 2016-06-02 ENCOUNTER — Encounter: Payer: Self-pay | Admitting: Cardiovascular Disease

## 2016-06-02 ENCOUNTER — Encounter: Payer: Medicare Other | Admitting: *Deleted

## 2016-06-02 VITALS — BP 130/50 | HR 59 | Ht 62.0 in | Wt 223.0 lb

## 2016-06-02 DIAGNOSIS — N183 Chronic kidney disease, stage 3 (moderate): Secondary | ICD-10-CM | POA: Diagnosis not present

## 2016-06-02 DIAGNOSIS — I5032 Chronic diastolic (congestive) heart failure: Secondary | ICD-10-CM

## 2016-06-02 DIAGNOSIS — I4891 Unspecified atrial fibrillation: Secondary | ICD-10-CM

## 2016-06-02 DIAGNOSIS — I10 Essential (primary) hypertension: Secondary | ICD-10-CM

## 2016-06-02 DIAGNOSIS — I25118 Atherosclerotic heart disease of native coronary artery with other forms of angina pectoris: Secondary | ICD-10-CM

## 2016-06-02 DIAGNOSIS — I509 Heart failure, unspecified: Secondary | ICD-10-CM | POA: Diagnosis not present

## 2016-06-02 DIAGNOSIS — R0602 Shortness of breath: Secondary | ICD-10-CM

## 2016-06-02 DIAGNOSIS — I209 Angina pectoris, unspecified: Secondary | ICD-10-CM | POA: Diagnosis not present

## 2016-06-02 DIAGNOSIS — J441 Chronic obstructive pulmonary disease with (acute) exacerbation: Secondary | ICD-10-CM | POA: Diagnosis not present

## 2016-06-02 DIAGNOSIS — N179 Acute kidney failure, unspecified: Secondary | ICD-10-CM

## 2016-06-02 DIAGNOSIS — D649 Anemia, unspecified: Secondary | ICD-10-CM

## 2016-06-02 MED ORDER — FUROSEMIDE 20 MG PO TABS: 20.0000 mg | ORAL_TABLET | Freq: Every day | ORAL | 3 refills | Status: DC | PRN

## 2016-06-02 NOTE — Patient Instructions (Addendum)
Medication Instructions:   Look for Vit D and B12, take daily  Continue Lasix 3 days per week   Labwork:  No new labs needed  Testing/Procedures:  No further testing at this time   I recommend watching educational videos on topics of interest to you at:       www.goemmi.com  Enter code: HEARTCARE    Follow-Up: It was a pleasure seeing you in the office today. Please call us if you have new issues that need to be addressed before your next appt.  [phone]  Your physician wants you to follow-up in: 3 months.  You will receive a reminder letter in the mail two months in advance. If you don't receive a letter, please call our office to schedule the follow-up appointment.  If you need a refill on your cardiac medications before your next appointment, please call your pharmacy.

## 2016-06-02 NOTE — Progress Notes (Signed)
Daily Session Note  Patient Details  Name: Amy [name] MRN: [id number] Date of Birth: [birth year] Referring Provider:     Pulmonary Rehab from 03/04/2016 in [facility] Cardiac and Pulmonary Rehab  Referring Provider  Gollan      Encounter Date: 06/02/2016  Check In:     Session Check In - 06/02/16 1216      Check-In   Location [facility]-Cardiac & Pulmonary Rehab   Staff Present Nada Maclachlan, BA, ACSM CEP, Exercise Physiologist;Delbra Zellars Amedeo Plenty, BS, ACSM CEP, Exercise Physiologist;Laureen Janell Quiet, RRT, Respiratory Therapist   Supervising physician immediately available to respond to emergencies [facility] immediately available ER MD   Physician(s) Corky Downs and Jimmye Norman    Medication changes reported     No   Fall or balance concerns reported    No   Warm-up and Cool-down Performed as group-led instruction   Resistance Training Performed Yes   VAD Patient? No     Pain Assessment   Currently in Pain? No/denies   Multiple Pain Sites No         History  Smoking Status  . Never Smoker  Smokeless Tobacco  . Never Used    Goals Met:  Proper associated with RPD/PD & O2 Sat Independence with exercise equipment Exercise tolerated well Strength training completed today  Goals Unmet:  Not Applicable  Comments: Pt able to follow exercise prescription today without complaint.  Will continue to monitor for progression.    Dr. Emily Filbert is Medical Director for [place] and [facility].

## 2016-06-02 NOTE — Progress Notes (Signed)
Pulmonary Individual Treatment Plan  Patient Details  Name: ABIE KILLIAN MRN: [id number] Date of Birth: 09-06-1927 Referring Provider:     Pulmonary Rehab from 03/04/2016 in [facility] Cardiac and Pulmonary Rehab  Referring Provider  Gollan      Initial Encounter Date:    Pulmonary Rehab from 03/04/2016 in [facility] Cardiac and Pulmonary Rehab  Date  03/04/16  Referring Provider  Rockey Situ      Visit Diagnosis: No diagnosis found.  Patient's Home Medications on Admission:  Current Outpatient Prescriptions:    aspirin 81 MG tablet, Take 81 mg by mouth daily., Disp: , Rfl:    cloNIDine (CATAPRES - DOSED IN MG/24 HR) 0.3 mg/24hr patch, Place 0.3 mg onto the skin once a week. , Disp: , Rfl:    clopidogrel (PLAVIX) 75 MG tablet, Take 1 tablet (75 mg total) by mouth daily., Disp: 90 tablet, Rfl: 3   doxazosin (CARDURA) 8 MG tablet, Take 1 tablet (8 mg total) by mouth 2 (two) times daily., Disp: 180 tablet, Rfl: 3   famotidine (PEPCID) 20 MG tablet, Take 1 tablet (20 mg total) by mouth 2 (two) times daily., Disp: 180 tablet, Rfl: 1   fluticasone (FLONASE) 50 MCG/ACT nasal spray, Place into the nose., Disp: , Rfl:    glimepiride (AMARYL) 4 MG tablet, Take 4 mg by mouth daily before breakfast., Disp: , Rfl:    ipratropium-albuterol (DUONEB) 0.5-2.5 (3) MG/3ML SOLN, Take 3 mLs by nebulization every 4 (four) hours as needed (shortness of breath/wheezing.)., Disp: 360 mL, Rfl:    levothyroxine (SYNTHROID, LEVOTHROID) 125 MCG tablet, Take 125 mcg by mouth daily., Disp: , Rfl:    losartan-hydrochlorothiazide (HYZAAR) 100-25 MG per tablet, Take 1 tablet by mouth daily.  , Disp: , Rfl:    metFORMIN (GLUCOPHAGE) 1000 MG tablet, Take 1,000 mg by mouth 2 (two) times daily with a meal., Disp: , Rfl:    Multiple Vitamins-Minerals (PRESERVISION AREDS PO), Take by mouth 2 (two) times daily., Disp: , Rfl:    oxybutynin (DITROPAN) 5 MG tablet, Take 5 mg by mouth 1 dose over 46 hours.  , Disp: , Rfl:     pantoprazole (PROTONIX) 40 MG tablet, Take 40 mg by mouth daily. , Disp: , Rfl:    senna (SENOKOT) 8.6 MG TABS tablet, Take 1 tablet (8.6 mg total) by mouth daily as needed for mild constipation., Disp: 120 each, Rfl: 0   simvastatin (ZOCOR) 40 MG tablet, Take 1 tablet (40 mg total) by mouth at bedtime. (Patient taking differently: Take 40 mg by mouth daily. ), Disp: 90 tablet, Rfl: 3   SYMBICORT 160-4.5 MCG/ACT inhaler, Inhale 2 puffs into the lungs 2 (two) times daily. , Disp: , Rfl:   Past Medical History: Past Medical History:  Diagnosis Date   Bladder incontinence    CAD (coronary artery disease)    a. cardiac cath 05/2009: proximal LAD 90% stenosis s/p PCI/Xience 2.75 x 12 mm DES, mid LAD 40%, diagonal 40%, proximal LCx 40% followed by 40% LCx lesion, 30%-40%-30% lesion noted in the RCA with distal RCA 30% and 25% lesions   Chronic diastolic (congestive) heart failure    a. Lexiscan 2013: no ischemia, normal EF b. echo 09/2015: EF 55-60% w/ Grade 1 DD   CKD (chronic kidney disease) stage 3, GFR 30-59 ml/min    Degenerative arthritis of knee    bilateral knees   Diabetes mellitus    Type II   Hiatal hernia    Hypertension    Iron deficiency  Menopausal symptoms    Morbid obesity (HCC)    Thyroid disease    hypothyroidism    Tobacco Use: History  Smoking Status   Never Smoker  Smokeless Tobacco   Never Used    Labs: Recent Review Flowsheet Data    Labs for ITP Cardiac and Pulmonary Rehab Latest Ref Rng & Units 05/07/2009 10/18/2009 06/25/2010 10/15/2012 10/01/2015   Cholestrol 0 - 200 mg/dL 148 147 135 - -   LDLCALC 0 - 99 mg/dL 80 80 56 - -   HDL >39 mg/dL 29.2 40 35(L) - -   Trlycerides <150 mg/dL 194 133 220(H) - -   Hemoglobin A1c 4.0 - 6.0 % 6.3 - - 8.6(H) 5.6       ADL UCSD:     Pulmonary Assessment Scores    Row Name 03/04/16 1208 04/30/16 1000       ADL UCSD   ADL Phase Entry Mid    SOB Score total 76 78    Rest 0 2    Walk 0 4     Stairs 5 5    Bath 2 3    Dress 3 3    Shop 5 4       Pulmonary Function Assessment:     Pulmonary Function Assessment - 03/04/16 1207      Initial Spirometry Results   FVC% 107 %   FEV1% 130 %   FEV1/FVC Ratio 89.63     Breath   Bilateral Breath Sounds Clear   Shortness of Breath Yes;Limiting activity;Fear of Shortness of Breath      Exercise Target Goals:    Exercise Program Goal: Individual exercise prescription set with THRR, safety & activity barriers. Participant demonstrates ability to understand and report RPE using BORG scale, to self-measure pulse accurately, and to acknowledge the importance of the exercise prescription.  Exercise Prescription Goal: Starting with aerobic activity 30 plus minutes a day, 3 days per week for initial exercise prescription. Provide home exercise prescription and guidelines that participant acknowledges understanding prior to discharge.  Activity Barriers & Risk Stratification:     Activity Barriers & Cardiac Risk Stratification - 03/04/16 1206      Activity Barriers & Cardiac Risk Stratification   Activity Barriers Shortness of Breath;Deconditioning;Muscular Weakness   Cardiac Risk Stratification Moderate      6 Minute Walk:     6 Minute Walk    Row Name 03/04/16 1223 04/28/16 1136       6 Minute Walk   Phase  -- Mid Program    Distance 90 feet 230 feet    Walk Time 1 minutes 2.5 minutes    # of Rest Breaks 2 3    MPH 0.9 1    METS 1.7 1.8    RPE 17 15    Perceived Dyspnea  4 3    VO2 Peak 5.9 6.2    Symptoms Yes (comment) No    Comments short of breath  --    Resting HR 89 bpm 84 bpm    Resting BP 122/52 152/84    Max Ex. HR 112 bpm 110 bpm    Max Ex. BP 152/42 152/64      Interval HR   Baseline HR  -- 84    1 Minute HR  -- 104    2 Minute HR  -- 92  resting    3 Minute HR  -- 110    4 Minute HR  -- 90  resting  5 Minute HR  -- 100    6 Minute HR  -- 99    2 Minute Post HR  -- 94    Interval Heart  Rate?  -- Yes      Interval Oxygen   Interval Oxygen?  -- Yes    Baseline Oxygen Saturation %  -- 97 %    Baseline Liters of Oxygen  -- 0 L    1 Minute Oxygen Saturation %  -- 97 %    1 Minute Liters of Oxygen  -- 0 L    2 Minute Oxygen Saturation %  -- 100 %    2 Minute Liters of Oxygen  -- 0 L    3 Minute Oxygen Saturation %  -- 96 %    3 Minute Liters of Oxygen  -- 0 L    4 Minute Oxygen Saturation %  -- 99 %    4 Minute Liters of Oxygen  -- 0 L    5 Minute Oxygen Saturation %  -- 99 %    5 Minute Liters of Oxygen  -- 0 L    6 Minute Oxygen Saturation %  -- 93 %    6 Minute Liters of Oxygen  -- 0 L    2 Minute Post Oxygen Saturation %  -- 99 %    2 Minute Post Liters of Oxygen  -- 0 L      Oxygen Initial Assessment:   Oxygen Re-Evaluation:   Oxygen Discharge (Final Oxygen Re-Evaluation):   Initial Exercise Prescription:     Initial Exercise Prescription - 03/04/16 1200      Date of Initial Exercise RX and Referring Provider   Date 03/04/16   Referring Provider Gollan     Treadmill   MPH 0.5   Grade 0   Minutes 15  1/1/1/1     NuStep   Level 1   Minutes 15   METs 1.5     T5 Nustep   Level 1   Minutes 15   METs 1.5     Biostep-RELP   Level 1   Minutes 15   METs 1.5     Prescription Details   Frequency (times per week) 3   Duration Progress to 30 minutes of continuous aerobic without signs/symptoms of physical distress     Intensity   THRR 40-80% of Max Heartrate 107-125   Ratings of Perceived Exertion 11-13   Perceived Dyspnea 0-4     Progression   Progression Continue to progress workloads to maintain intensity without signs/symptoms of physical distress.     Resistance Training   Training Prescription Yes   Weight 1   Reps 10-15      Perform Capillary Blood Glucose checks as needed.  Exercise Prescription Changes:     Exercise Prescription Changes    Row Name 03/10/16 1100 03/28/16 0900 04/10/16 1100 04/16/16 1200 04/24/16 1100      Response to Exercise   Blood Pressure (Admit) 164/60 124/60 138/56  -- 148/60   Blood Pressure (Exercise) 168/58 126/64 128/64  -- 106/74   Blood Pressure (Exit) 148/62 134/80 104/58  -- 120/60   Heart Rate (Admit) 103 bpm 95 bpm 97 bpm  -- 85 bpm   Heart Rate (Exercise) 93 bpm 89 bpm 93 bpm  -- 92 bpm   Heart Rate (Exit) 88 bpm 91 bpm 89 bpm  -- 97 bpm   Oxygen Saturation (Admit) 98 % 97 % 98 %  -- 99 %  Oxygen Saturation (Exercise) 98 % 95 % 95 %  -- 97 %   Oxygen Saturation (Exit) 97 % 96 % 98 %  -- 98 %   Rating of Perceived Exertion (Exercise) 13 15 13   -- 15   Perceived Dyspnea (Exercise) 4 3 4   -- 3   Duration  -- Progress to 45 minutes of aerobic exercise without signs/symptoms of physical distress Progress to 45 minutes of aerobic exercise without signs/symptoms of physical distress  -- Progress to 45 minutes of aerobic exercise without signs/symptoms of physical distress   Intensity  -- THRR unchanged THRR unchanged  -- THRR unchanged     Progression   Progression  -- Continue to progress workloads to maintain intensity without signs/symptoms of physical distress. Continue to progress workloads to maintain intensity without signs/symptoms of physical distress.  -- Continue to progress workloads to maintain intensity without signs/symptoms of physical distress.     Resistance Training   Training Prescription Yes Yes Yes  -- Yes   Weight 1 1 2   -- 2   Reps 10-15 10-15 10-12  -- 10-15     Interval Training   Interval Training  --  -- No  -- No     NuStep   Level 1 3 3   -- 3   Minutes 15 15 15   -- 15   METs 1.5 1.2  --  -- 1.6     T5 Nustep   Level 1  -- 1  -- 1   Minutes 15 15 15   -- 15   METs 1.5 1.7 1.8  -- 1.6     Home Exercise Plan   Plans to continue exercise at  --  --  -- [employer]  --   Frequency  --  --  -- Add 1 additional day to program exercise sessions.  --   Row Name 05/09/16 1200 05/21/16 1200           Response to Exercise   Blood Pressure  (Admit) 138/60 146/64      Blood Pressure (Exercise) 162/68 160/64      Blood Pressure (Exit) 128/54 130/62      Heart Rate (Admit) 62 bpm 90 bpm      Heart Rate (Exercise) 84 bpm 95 bpm      Heart Rate (Exit) 89 bpm 96 bpm      Oxygen Saturation (Admit) 100 % 100 %      Oxygen Saturation (Exercise) 97 % 97 %      Oxygen Saturation (Exit) 98 % 99 %      Rating of Perceived Exertion (Exercise) 15 15      Perceived Dyspnea (Exercise) 5 3      Duration Progress to 45 minutes of aerobic exercise without signs/symptoms of physical distress Progress to 45 minutes of aerobic exercise without signs/symptoms of physical distress      Intensity THRR unchanged THRR unchanged        Progression   Progression Continue to progress workloads to maintain intensity without signs/symptoms of physical distress. Continue to progress workloads to maintain intensity without signs/symptoms of physical distress.        Resistance Training   Training Prescription Yes Yes      Weight 2 2      Reps 10-15 10-15        NuStep   Level 5 5      Minutes 15 15      METs 1.6 1.6  T5 Nustep   Level 5 1      Minutes 15 15      METs 1.8  --        Track   Laps  -- 6         Exercise Comments:     Exercise Comments    Row Name 03/10/16 1149 03/28/16 5573 04/02/16 1128 04/10/16 1202 04/16/16 1258   Exercise Comments First full day of exercise!  Patient was oriented to gym and equipment including functions, settings, policies, and procedures.  Patient's individual exercise prescription and treatment plan were reviewed.  All starting workloads were established based on the results of the 6 minute walk test done at initial orientation visit.  The plan for exercise progression was also introduced and progression will be customized based on patient's performance and goals Tiwana has been able to walk in one minute intervals on the TM. Landri moved up to 1.3 mph and did 2 min intervals today!! Florence continues to  add time to her treadmill walking.  She has missed a couple days due to an eye injection but returned 2/14. Ardena does some walking at home currently and is considering [employer] when she graduates LW.  She still is short of breath; we discussed the importance of exercise and walking for ADLs as well as practicing breathing techniques.  Staff also encouraged her to stay for education to learn to better manage her CHF.   [place] Name 04/24/16 1121 04/28/16 1147 05/09/16 1204 05/21/16 1254     Exercise Comments Abra continues to work on adding time to her TM walking. Mid 6 min walk test was done with Doralene today. Results and progresss were discussed with her.  Ailen is showing steady progress in her functional capacity. Burgundy is doing well walking with a walker insead of the TM.       Exercise Goals and Review:   Exercise Goals Re-Evaluation :   Discharge Exercise Prescription (Final Exercise Prescription Changes):     Exercise Prescription Changes - 05/21/16 1200      Response to Exercise   Blood Pressure (Admit) 146/64   Blood Pressure (Exercise) 160/64   Blood Pressure (Exit) 130/62   Heart Rate (Admit) 90 bpm   Heart Rate (Exercise) 95 bpm   Heart Rate (Exit) 96 bpm   Oxygen Saturation (Admit) 100 %   Oxygen Saturation (Exercise) 97 %   Oxygen Saturation (Exit) 99 %   Rating of Perceived Exertion (Exercise) 15   Perceived Dyspnea (Exercise) 3   Duration Progress to 45 minutes of aerobic exercise without signs/symptoms of physical distress   Intensity THRR unchanged     Progression   Progression Continue to progress workloads to maintain intensity without signs/symptoms of physical distress.     Resistance Training   Training Prescription Yes   Weight 2   Reps 10-15     NuStep   Level 5   Minutes 15   METs 1.6     T5 Nustep   Level 1   Minutes 15     Track   Laps 6      Nutrition:  Target Goals: Understanding of nutrition guidelines, daily intake of sodium  <1576m, cholesterol <2034m calories 30% from fat and 7% or less from saturated fats, daily to have 5 or more servings of fruits and vegetables.  Biometrics:     Pre Biometrics - 03/10/16 1143      Pre Biometrics   Waist Circumference 45 inches  Hip Circumference 56.5 inches   Waist to Hip Ratio 0.8 %       Nutrition Therapy Plan and Nutrition Goals:   Nutrition Discharge: Rate Your Plate Scores:   Nutrition Goals Re-Evaluation:   Nutrition Goals Discharge (Final Nutrition Goals Re-Evaluation):   Psychosocial: Target Goals: Acknowledge presence or absence of significant depression and/or stress, maximize coping skills, provide positive support system. Participant is able to verbalize types and ability to use techniques and skills needed for reducing stress and depression.   Initial Review & Psychosocial Screening:     Initial Psych Review & Screening - 03/04/16 [place]? Yes   Comments Ms Dunkley has good support from her daughter and son, although the son who lives in [place] helps the most with errands and household chores. She has sadness over the death of her one daughter who died of a stroke. Ms Branscomb is looking forward to walking more and being nore active.     Barriers   Psychosocial barriers to participate in program The patient should benefit from training in stress management and relaxation.     Screening Interventions   Interventions Encouraged to exercise;Program counselor consult      Quality of Life Scores:     Quality of Life - 03/04/16 1218      Quality of Life Scores   Health/Function Pre 21 %   Socioeconomic Pre 21 %   Psych/Spiritual Pre 21 %   Family Pre 21 %   GLOBAL Pre 21 %      PHQ-9: Recent Review Flowsheet Data    Depression screen [facility] 2/9 03/04/2016   Decreased Interest 3   Down, Depressed, Hopeless 2   PHQ - 2 Score 5   Altered sleeping 2   Tired, decreased energy 3   Change  in appetite 2   Feeling bad or failure about yourself  1   Trouble concentrating 1   Moving slowly or fidgety/restless 0   Suicidal thoughts 0   PHQ-9 Score 14   Difficult doing work/chores Somewhat difficult     Interpretation of Total Score  Total Score Depression Severity:  1-4 = Minimal depression, 5-9 = Mild depression, 10-14 = Moderate depression, 15-19 = Moderately severe depression, 20-27 = Severe depression   Psychosocial Evaluation and Intervention:     Psychosocial Evaluation - 03/26/16 1044      Psychosocial Evaluation & Interventions   Comments Counselor met with Ms. Kallen today for initial psychosocial evaluation.  She just turned 83 years old last week and has been diagnosed with Congestive Heart Failure.  Ms. Steffler lives alone but has several adult children who check on her frequently and she also is somewhat involved in her local church.  Ms. Stemmer states her sleep in interrupted often with bathroom visits, etc, but that she typically gets around 6 hours/night total.  Her appetite is good also.  She denies a history of depression and anxiety but has some current symptoms since the loss of her 71 year old daughter several years ago due to a stroke.  This daughter was the primary caregiver for Ms. Meske so this has had a tremendous impact on her.  She states her mood is stable and impacted by her own health and daughters death.  Her stress is her health and her limited ability to do things on her own.  She has goals to walk better and breathe better; as well as improved sleep.  Counselor encouraged Ms. Kofman to consider either individual counseling or a grief support group; but she declined and stated this is not something she is interested in doing.  Counselor and staff will monitor Ms. Landini's progress on her goals throughout the course of this program.  She will benefit from the depression and stress management psychoeducational components of this  program.     Continue Psychosocial Services  Yes  Monitor mood and sleep.      Psychosocial Re-Evaluation:     Psychosocial Re-Evaluation    [place] Name 04/23/16 1456 05/21/16 1504           Psychosocial Re-Evaluation   Comments Ms Setzler states today how much she is enjoying [facility] and has made improvements in her activity levels at home. It is very noticeable her improved walking ability in the gym .  She is more involved in the class as for as socializing with the other participants. I have encouraged her to stay for education. Today Ms Feuerstein stated her interest in the class of Conserving Energy.  --      Expected Outcomes  -- Ms Scioli is progressing with her exercise goals and has noticed improvements with walking a home. She now can park her car on the driveway, instead of the grass by the door. This all is very positive for her, for she wants to live independently. Ms Tew enjoys the socialization with the other participants and attends regularly.          Psychosocial Discharge (Final Psychosocial Re-Evaluation):     Psychosocial Re-Evaluation - 05/21/16 1504      Psychosocial Re-Evaluation   Expected Outcomes Ms Giel is progressing with her exercise goals and has noticed improvements with walking a home. She now can park her car on the driveway, instead of the grass by the door. This all is very positive for her, for she wants to live independently. Ms Deckman enjoys the socialization with the other participants and attends regularly.       Education: Education Goals: Education classes will be provided on a weekly basis, covering required topics. Participant will state understanding/return demonstration of topics presented.  Learning Barriers/Preferences:     Learning Barriers/Preferences - 03/04/16 1206      Learning Barriers/Preferences   Learning Barriers None   Learning Preferences None      Education Topics: Initial Evaluation  Education: - Verbal, written and demonstration of respiratory meds, RPE/PD scales, oximetry and breathing techniques. Instruction on use of nebulizers and MDIs: cleaning and proper use, rinsing mouth with steroid doses and importance of monitoring MDI activations.   Pulmonary Rehab from 05/30/2016 in [facility] Cardiac and Pulmonary Rehab  Date  03/04/16  Educator  LB  Instruction Review Code  2- meets goals/outcomes      General Nutrition Guidelines/Fats and Fiber: -Group instruction provided by verbal, written material, models and posters to present the general guidelines for heart healthy nutrition. Gives an explanation and review of dietary fats and fiber.   Pulmonary Rehab from 05/30/2016 in [facility] Cardiac and Pulmonary Rehab  Date  04/28/16  Educator  CR  Instruction Review Code  2- meets goals/outcomes      Controlling Sodium/Reading Food Labels: -Group verbal and written material supporting the discussion of sodium use in heart healthy nutrition. Review and explanation with models, verbal and written materials for utilization of the food label.   Exercise Physiology & Risk Factors: - Group verbal and written instruction with models to review the exercise physiology of  the cardiovascular system and associated critical values. Details cardiovascular disease risk factors and the goals associated with each risk factor.   Aerobic Exercise & Resistance Training: - Gives group verbal and written discussion on the health impact of inactivity. On the components of aerobic and resistive training programs and the benefits of this training and how to safely progress through these programs.   Pulmonary [facility] Cardiac [facility] Pulmonary Rehab  Date  03/26/16  Educator  [employer]  Instruction Review Code  2- meets goals/outcomes      Flexibility, Balance, General Exercise Guidelines: - Provides group verbal and written instruction on the benefits of flexibility and balance training  programs. Provides general exercise guidelines with specific guidelines to those with heart or lung disease. Demonstration and skill practice provided.   Stress Management: - Provides group verbal and written instruction about the health risks of elevated stress, cause of high stress, and healthy ways to reduce stress.   Depression: - Provides group verbal and written instruction on the correlation between heart/lung disease and depressed mood, treatment options, and the stigmas associated with seeking treatment.   Exercise & Equipment Safety: - Individual verbal instruction and demonstration of equipment use and safety with use of the equipment.   Pulmonary Rehab from 05/30/2016 in [facility] Cardiac and Pulmonary Rehab  Date  05/30/16  Educator  C. Barbee Shropshire and I had a long discussion about her being able to ex]  Instruction Review Code  2- meets goals/outcomes      Infection Prevention: - Provides verbal and written material to individual with discussion of infection control including proper hand washing and proper equipment cleaning during exercise session.   Pulmonary Rehab from 05/30/2016 in [facility] Cardiac and Pulmonary Rehab  Date  03/10/16  Educator  AS  Instruction Review Code  2- meets goals/outcomes      Falls Prevention: - Provides verbal and written material to individual with discussion of falls prevention and safety.   Pulmonary Rehab from 05/30/2016 in [facility] Cardiac and Pulmonary Rehab  Date  03/04/16  Educator  LB  Instruction Review Code  2- meets goals/outcomes      Diabetes: - Individual verbal and written instruction to review signs/symptoms of diabetes, desired ranges of glucose level fasting, after meals and with exercise. Advice that pre and post exercise glucose checks will be done for 3 sessions at entry of program.   Chronic Lung Diseases: - Group verbal and written instruction to review new updates, new respiratory medications, new advancements in  procedures and treatments. Provide informative websites and "800" numbers of self-education.   Lung Procedures: - Group verbal and written instruction to describe testing methods done to diagnose lung disease. Review the outcome of test results. Describe the treatment choices: Pulmonary Function Tests, ABGs and oximetry.   Energy Conservation: - Provide group verbal and written instruction for methods to conserve energy, plan and organize activities. Instruct on pacing techniques, use of adaptive equipment and posture/positioning to relieve shortness of breath.   Pulmonary Rehab from 05/30/2016 in [facility] Cardiac and Pulmonary Rehab  Date  04/23/16  Educator  [facility]  Instruction Review Code  2- meets goals/outcomes      Triggers: - Group verbal and written instruction to review types of environmental controls: home humidity, furnaces, filters, dust mite/pet prevention, HEPA vacuums. To discuss weather changes, air quality and the benefits of nasal washing.   Exacerbations: - Group verbal and written instruction to provide: warning signs, infection symptoms, calling MD promptly,  preventive modes, and value of vaccinations. Review: effective airway clearance, coughing and/or vibration techniques. Create an Sports administrator.   Pulmonary Rehab from 05/30/2016 in [facility] Cardiac and Pulmonary Rehab  Date  04/09/16  Educator  LB  Instruction Review Code  2- meets goals/outcomes      Oxygen: - Individual and group verbal and written instruction on oxygen therapy. Includes supplement oxygen, available portable oxygen systems, continuous and intermittent flow rates, oxygen safety, concentrators, and Medicare reimbursement for oxygen.   Respiratory Medications: - Group verbal and written instruction to review medications for lung disease. Drug class, frequency, complications, importance of spacers, rinsing mouth after steroid MDI's, and proper cleaning methods for nebulizers.   AED/CPR: - Group verbal and  written instruction with the use of models to demonstrate the basic use of the AED with the basic ABC's of resuscitation.   Breathing Retraining: - Provides individuals verbal and written instruction on purpose, frequency, and proper technique of diaphragmatic breathing and pursed-lipped breathing. Applies individual practice skills.   Pulmonary Rehab from 05/30/2016 in [facility] Cardiac and Pulmonary Rehab  Date  03/10/16  Educator  AS  Instruction Review Code  2- meets goals/outcomes      Anatomy and Physiology of the Lungs: - Group verbal and written instruction with the use of models to provide basic lung anatomy and physiology related to function, structure and complications of lung disease.   Heart Failure: - Group verbal and written instruction on the basics of heart failure: signs/symptoms, treatments, explanation of ejection fraction, enlarged heart and cardiomyopathy.   Sleep Apnea: - Individual verbal and written instruction to review Obstructive Sleep Apnea. Review of risk factors, methods for diagnosing and types of masks and machines for OSA.   Anxiety: - Provides group, verbal and written instruction on the correlation between heart/lung disease and anxiety, treatment options, and management of anxiety.   Relaxation: - Provides group, verbal and written instruction about the benefits of relaxation for patients with heart/lung disease. Also provides patients with examples of relaxation techniques.   Knowledge Questionnaire Score:     Knowledge Questionnaire Score - 03/04/16 1206      Knowledge Questionnaire Score   Pre Score 6/10       Core Components/Risk Factors/Patient Goals at Admission:     Personal Goals and Risk Factors at Admission - 03/04/16 1212      Core Components/Risk Factors/Patient Goals on Admission   Sedentary Yes  Goal: improve walking   Intervention Provide advice, education, support and counseling about physical activity/exercise  needs.;Develop an individualized exercise prescription for aerobic and resistive training based on initial evaluation findings, risk stratification, comorbidities and participant's personal goals.   Expected Outcomes Achievement of increased cardiorespiratory fitness and enhanced flexibility, muscular endurance and strength shown through measurements of functional capacity and personal statement of participant.   Increase Strength and Stamina Yes   Intervention Provide advice, education, support and counseling about physical activity/exercise needs.;Develop an individualized exercise prescription for aerobic and resistive training based on initial evaluation findings, risk stratification, comorbidities and participant's personal goals.   Expected Outcomes Achievement of increased cardiorespiratory fitness and enhanced flexibility, muscular endurance and strength shown through measurements of functional capacity and personal statement of participant.   Improve shortness of breath with ADL's Yes   Intervention Provide education, individualized exercise plan and daily activity instruction to help decrease symptoms of SOB with activities of daily living.   Expected Outcomes Short Term: Achieves a reduction of symptoms when performing activities of daily living.  Develop more efficient breathing techniques such as purse lipped breathing and diaphragmatic breathing; and practicing self-pacing with activity Yes   Intervention Provide education, demonstration and support about specific breathing techniuqes utilized for more efficient breathing. Include techniques such as pursed lipped breathing, diaphragmatic breathing and self-pacing activity.   Expected Outcomes Short Term: Participant will be able to demonstrate and use breathing techniques as needed throughout daily activities.   Increase knowledge of respiratory medications and ability to use respiratory devices properly  Yes   Intervention Provide  education and demonstration as needed of appropriate use of medications, inhalers, and oxygen therapy.  Duoneb SVN, Symbicort, Flutter Valve, Incentive Spirometer   Expected Outcomes Short Term: Achieves understanding of medications use. Understands that oxygen is a medication prescribed by physician. Demonstrates appropriate use of inhaler and oxygen therapy.   Diabetes Yes   Intervention Provide education about signs/symptoms and action to take for hypo/hyperglycemia.;Provide education about proper nutrition, including hydration, and aerobic/resistive exercise prescription along with prescribed medications to achieve blood glucose in normal ranges: Fasting glucose 65-99 mg/dL   Expected Outcomes Short Term: Participant verbalizes understanding of the signs/symptoms and immediate care of hyper/hypoglycemia, proper foot care and importance of medication, aerobic/resistive exercise and nutrition plan for blood glucose control.;Long Term: Attainment of HbA1C < 7%.   Heart Failure Yes   Intervention Provide a combined exercise and nutrition program that is supplemented with education, support and counseling about heart failure. Directed toward relieving symptoms such as shortness of breath, decreased exercise tolerance, and extremity edema.   Expected Outcomes Improve functional capacity of life;Short term: Attendance in program 2-3 days a week with increased exercise capacity. Reported lower sodium intake. Reported increased fruit and vegetable intake. Reports medication compliance.;Short term: Daily weights obtained and reported for increase. Utilizing diuretic protocols set by physician.;Long term: Adoption of self-care skills and reduction of barriers for early signs and symptoms recognition and intervention leading to self-care maintenance.   Hypertension Yes   Intervention Provide education on lifestyle modifcations including regular physical activity/exercise, weight management, moderate sodium  restriction and increased consumption of fresh fruit, vegetables, and low fat dairy, alcohol moderation, and smoking cessation.;Monitor prescription use compliance.   Expected Outcomes Short Term: Continued assessment and intervention until BP is < 140/48m HG in hypertensive participants. < 130/881mHG in hypertensive participants with diabetes, heart failure or chronic kidney disease.;Long Term: Maintenance of blood pressure at goal levels.   Lipids Yes   Intervention Provide education and support for participant on nutrition & aerobic/resistive exercise along with prescribed medications to achieve LDL <7036mHDL >16m57m Expected Outcomes Short Term: Participant states understanding of desired cholesterol values and is compliant with medications prescribed. Participant is following exercise prescription and nutrition guidelines.;Long Term: Cholesterol controlled with medications as prescribed, with individualized exercise RX and with personalized nutrition plan. Value goals: LDL < 70mg68mL > 40 mg.      Core Components/Risk Factors/Patient Goals Review:      Goals and Risk Factor Review    Row Name 03/10/16 1143 03/21/16 1147 04/14/16 1146 05/21/16 1251 05/26/16 1621     Core Components/Risk Factors/Patient Goals Review   Personal Goals Review Develop more efficient breathing techniques such as purse lipped breathing and diaphragmatic breathing and practicing self-pacing with activity. Weight Management/Obesity;Improve shortness of breath with ADL's;Heart Failure Sedentary;Improve shortness of breath with ADL's;Increase Strength and Stamina;Develop more efficient breathing techniques such as purse lipped breathing and diaphragmatic breathing and practicing self-pacing with activity. Improve shortness of breath with ADL's;Heart  Failure Improve shortness of breath with ADL's;Develop more efficient breathing techniques such as purse lipped breathing and diaphragmatic breathing and practicing  self-pacing with activity.   Review Pursed lip breathing was reviewed with the patient. She demonstrated understanding of this breathing technique.  Tatum has not been monitoring her weight as she does not have a scale at home.  Daily and weekly numbers were reviewed as well as signs of CHF.  She has felt tired since starting LW but feels practicing PLB helps her breathe better. Banita states she can get around a little better since beginning exercise.  Her weight has been stable and even down a few pounds since beginning [place].  She still feels walking the TM is hard breathing wise, so we reviewed PLB and she was able to walk 1:30 on the TM 2 times. Lacy was able to get scales throught the [facility] to keep at home.  She couldnt get them to work but has family to help.  She knows she has fluid retention problems but was not sure what limits her Dr wants her to use when weighing herself.  She can walk from her house to her car without SOB.  She still stops to rest with ADLs but can "do more than when she started." Ms Lau complained of shortness of breath today -  Qued her to perform PLB which does help her. She now parks her car in the driveway, instead of on the grass by her door. Ms Gotto is becoming more independent in class with equipment set-up and recording her O2Sat' and [place].   Expected Outcomes Patient will use PLB to with exercise and ADL's to help control SOB and improve O2 Sats.  Staff will check on getting her a scale throught the [facility] [employer] to monitor weight.  Regular participation in [place]  will help Chrystel increase oveall stamina and better manage her CHF. Jensine will continue to improve her stamina. Kayleah will better manage her fluid retention adn CHF with the scales and continue to improve SOB with ADLs. Continue exercising to increase her stamina and use PLB with activity      Core Components/Risk Factors/Patient Goals at Discharge (Final Review):      Goals and Risk  Factor Review - 05/26/16 1621      Core Components/Risk Factors/Patient Goals Review   Personal Goals Review Improve shortness of breath with ADL's;Develop more efficient breathing techniques such as purse lipped breathing and diaphragmatic breathing and practicing self-pacing with activity.   Review Ms Dewoody complained of shortness of breath today -  Qued her to perform PLB which does help her. She now parks her car in the driveway, instead of on the grass by her door. Ms Dutko is becoming more independent in class with equipment set-up and recording her O2Sat' and [place].   Expected Outcomes Continue exercising to increase her stamina and use PLB with activity      ITP Comments:     ITP Comments    Row Name 05/30/16 1121           ITP Comments I discussed today with Zohar the option to exercise in the Care Class at 12:15pm on Tuesday and Thursdays. Her doctor who she sees in the [place] for her anemia is going to write the order to clear her to exercise in that class. 11:10-11:21          Comments: 30 day note review

## 2016-06-04 DIAGNOSIS — I5032 Chronic diastolic (congestive) heart failure: Secondary | ICD-10-CM

## 2016-06-04 DIAGNOSIS — I509 Heart failure, unspecified: Secondary | ICD-10-CM | POA: Diagnosis not present

## 2016-06-04 NOTE — Progress Notes (Signed)
Daily Session Note  Patient Details  Name: Amy [name] MRN: [id number] Date of Birth: January 31, 1928 Referring Provider:     Pulmonary Rehab from 03/04/2016 in [facility] Cardiac and Pulmonary Rehab  Referring Provider  Gollan      Encounter Date: 06/04/2016  Check In:     Session Check In - 06/04/16 1427      Check-In   Location [facility]-Cardiac & Pulmonary Rehab   Staff Present Carson Myrtle, BS, RRT, Respiratory Lennie Hummer, MA, ACSM RCEP, Exercise Physiologist;Esperanza Madrazo Oletta Darter, BA, ACSM CEP, Exercise Physiologist   Supervising physician immediately available to respond to emergencies [facility] immediately available ER MD   Physician(s) Jimmye Norman and Darl Householder   Medication changes reported     No   Fall or balance concerns reported    No   Warm-up and Cool-down Performed as group-led instruction   Resistance Training Performed Yes   VAD Patient? No     Pain Assessment   Currently in Pain? No/denies         History  Smoking Status  . Never Smoker  Smokeless Tobacco  . Never Used    Goals Met:  Proper associated with RPD/PD & O2 Sat Independence with exercise equipment Exercise tolerated well Strength training completed today  Goals Unmet:  Not Applicable  Comments: Pt able to follow exercise prescription today without complaint.  Will continue to monitor for progression.    Dr. Emily Filbert is Medical Director for [place] and [facility].

## 2016-06-06 DIAGNOSIS — I5032 Chronic diastolic (congestive) heart failure: Secondary | ICD-10-CM

## 2016-06-06 DIAGNOSIS — I509 Heart failure, unspecified: Secondary | ICD-10-CM | POA: Diagnosis not present

## 2016-06-06 NOTE — Progress Notes (Signed)
Daily Session Note  Patient Details  Name: Amy [name] MRN: [id number] Date of Birth: 02/13/1928 Referring Provider:     Pulmonary Rehab from 03/04/2016 in [facility] Cardiac and Pulmonary Rehab  Referring Provider  Gollan      Encounter Date: 06/06/2016  Check In:     Session Check In - 06/06/16 1059      Check-In   Location [facility]-Cardiac & Pulmonary Rehab   Staff Present Carroll Enterkin, RN, BSN;Amanda Sommer, BA, ACSM CEP, Exercise Physiologist;Other  Krista Spencer RN   Medication changes reported     No   Fall or balance concerns reported    No   Warm-up and Cool-down Performed as group-led instruction   Resistance Training Performed Yes   VAD Patient? No     Pain Assessment   Currently in Pain? No/denies         History  Smoking Status  . Never Smoker  Smokeless Tobacco  . Never Used    Goals Met:  Proper associated with RPD/PD & O2 Sat Independence with exercise equipment Exercise tolerated well Strength training completed today  Goals Unmet:  Not Applicable  Comments: Pt able to follow exercise prescription today without complaint.  Will continue to monitor for progression.    Dr. Mark Miller is Medical Director for [facility] and [facility]. 

## 2016-06-07 LAB — GLUCOSE, CAPILLARY: Glucose-Capillary: 111 mg/dL — ABNORMAL HIGH (ref 65–99)

## 2016-06-09 ENCOUNTER — Encounter: Payer: Medicare Other | Admitting: *Deleted

## 2016-06-09 DIAGNOSIS — I509 Heart failure, unspecified: Secondary | ICD-10-CM | POA: Diagnosis not present

## 2016-06-09 DIAGNOSIS — I5032 Chronic diastolic (congestive) heart failure: Secondary | ICD-10-CM

## 2016-06-09 NOTE — Progress Notes (Signed)
Daily Session Note  Patient Details  Name: Amy [name] MRN: [id number] Date of Birth: [birth year] Referring Provider:     Pulmonary Rehab from 03/04/2016 in [facility] Cardiac and Pulmonary Rehab  Referring Provider  Gollan      Encounter Date: 06/09/2016  Check In:     Session Check In - 06/09/16 1015      Check-In   Location [facility]-Cardiac & Pulmonary Rehab   Staff Present Nada Maclachlan, BA, ACSM CEP, Exercise Physiologist;Laureen Owens Shark, BS, RRT, Respiratory Bertis Ruddy, BS, ACSM CEP, Exercise Physiologist   Supervising physician immediately available to respond to emergencies [facility] immediately available ER MD   Physician(s) Drs. Rifenbark and Kinner   Medication changes reported     No   Fall or balance concerns reported    No   Warm-up and Cool-down Performed as group-led Location manager Performed Yes   VAD Patient? No     Pain Assessment   Currently in Pain? No/denies   Multiple Pain Sites No         History  Smoking Status  . Never Smoker  Smokeless Tobacco  . Never Used    Goals Met:  Proper associated with RPD/PD & O2 Sat Independence with exercise equipment Exercise tolerated well Strength training completed today  Goals Unmet:  Not Applicable  Comments: Pt able to follow exercise prescription today without complaint.  Will continue to monitor for progression.    Dr. Emily Filbert is Medical Director for [place] and [facility].

## 2016-06-13 DIAGNOSIS — H353211 Exudative age-related macular degeneration, right eye, with active choroidal neovascularization: Secondary | ICD-10-CM | POA: Diagnosis not present

## 2016-06-19 ENCOUNTER — Ambulatory Visit: Payer: Medicare Other | Admitting: Cardiovascular Disease

## 2016-06-20 ENCOUNTER — Telehealth: Payer: Self-pay

## 2016-06-20 NOTE — Telephone Encounter (Signed)
Has had company - has new great grandchild.  Plans to be back Monday.

## 2016-06-23 ENCOUNTER — Encounter: Payer: Medicare Other | Admitting: *Deleted

## 2016-06-23 DIAGNOSIS — I509 Heart failure, unspecified: Secondary | ICD-10-CM | POA: Diagnosis not present

## 2016-06-23 DIAGNOSIS — I5032 Chronic diastolic (congestive) heart failure: Secondary | ICD-10-CM

## 2016-06-23 NOTE — Progress Notes (Signed)
Daily Session Note  Patient Details  Name: Amy [name] MRN: [id number] Date of Birth: 1927/05/20 Referring Provider:     Pulmonary Rehab from 03/04/2016 in [facility] Cardiac and Pulmonary Rehab  Referring Provider  Gollan      Encounter Date: 06/23/2016  Check In:     Session Check In - 06/23/16 1131      Check-In   Location [facility]-Cardiac & Pulmonary Rehab   Staff Present Nada Maclachlan, BA, ACSM CEP, Exercise Physiologist;Laureen Owens Shark, BS, RRT, Respiratory Bertis Ruddy, BS, ACSM CEP, Exercise Physiologist   Supervising physician immediately available to respond to emergencies [facility] immediately available ER MD   Physician(s) Clearnce Hasten and Corky Downs   Medication changes reported     No   Fall or balance concerns reported    No   Warm-up and Cool-down Performed as group-led instruction   Resistance Training Performed Yes   VAD Patient? No     Pain Assessment   Currently in Pain? No/denies   Multiple Pain Sites No         History  Smoking Status  . Never Smoker  Smokeless Tobacco  . Never Used    Goals Met:  Proper associated with RPD/PD & O2 Sat Independence with exercise equipment Exercise tolerated well Strength training completed today  Goals Unmet:  Not Applicable  Comments: Pt able to follow exercise prescription today without complaint.  Will continue to monitor for progression.    Dr. Emily Filbert is Medical Director for [place] and [facility].

## 2016-06-24 DIAGNOSIS — D500 Iron deficiency anemia secondary to blood loss (chronic): Secondary | ICD-10-CM | POA: Diagnosis not present

## 2016-06-24 DIAGNOSIS — I2510 Atherosclerotic heart disease of native coronary artery without angina pectoris: Secondary | ICD-10-CM | POA: Diagnosis not present

## 2016-06-24 DIAGNOSIS — E7801 Familial hypercholesterolemia: Secondary | ICD-10-CM | POA: Diagnosis not present

## 2016-06-24 DIAGNOSIS — I10 Essential (primary) hypertension: Secondary | ICD-10-CM | POA: Diagnosis not present

## 2016-06-24 DIAGNOSIS — E1165 Type 2 diabetes mellitus with hyperglycemia: Secondary | ICD-10-CM | POA: Diagnosis not present

## 2016-06-24 DIAGNOSIS — E538 Deficiency of other specified B group vitamins: Secondary | ICD-10-CM | POA: Diagnosis not present

## 2016-06-24 DIAGNOSIS — E079 Disorder of thyroid, unspecified: Secondary | ICD-10-CM | POA: Diagnosis not present

## 2016-06-25 ENCOUNTER — Encounter: Payer: Medicare Other | Attending: Cardiovascular Disease

## 2016-06-25 DIAGNOSIS — I5032 Chronic diastolic (congestive) heart failure: Secondary | ICD-10-CM

## 2016-06-25 DIAGNOSIS — I509 Heart failure, unspecified: Secondary | ICD-10-CM | POA: Diagnosis not present

## 2016-06-25 NOTE — Progress Notes (Signed)
Daily Session Note  Patient Details  Name: Amy [name] MRN: [id number] Date of Birth: 1927/12/09 Referring Provider:     Pulmonary Rehab from 03/04/2016 in [facility] Cardiac and Pulmonary Rehab  Referring Provider  Gollan      Encounter Date: 06/25/2016  Check In:     Session Check In - 06/25/16 1053      Check-In   Location [facility]-Cardiac & Pulmonary Rehab   Staff Present Alberteen Sam, MA, ACSM RCEP, Exercise Physiologist;Laureen Owens Shark, BS, RRT, Respiratory Dareen Piano, BA, ACSM CEP, Exercise Physiologist   Supervising physician immediately available to respond to emergencies [facility] immediately available ER MD   Physician(s) Mariea Clonts and Reita Cliche   Medication changes reported     No   Fall or balance concerns reported    No   Warm-up and Cool-down Performed as group-led Location manager Performed Yes   VAD Patient? No     Pain Assessment   Currently in Pain? No/denies         History  Smoking Status  . Never Smoker  Smokeless Tobacco  . Never Used    Goals Met:  Proper associated with RPD/PD & O2 Sat Independence with exercise equipment Exercise tolerated well Strength training completed today  Goals Unmet:  Not Applicable  Comments:  Pt able to complete exercise with no complaints.    Dr. Emily Filbert is Medical Director for [place] and [facility].

## 2016-06-27 ENCOUNTER — Encounter: Payer: Medicare Other | Admitting: *Deleted

## 2016-06-27 DIAGNOSIS — I5032 Chronic diastolic (congestive) heart failure: Secondary | ICD-10-CM

## 2016-06-27 DIAGNOSIS — I509 Heart failure, unspecified: Secondary | ICD-10-CM | POA: Diagnosis not present

## 2016-06-27 NOTE — Progress Notes (Signed)
Daily Session Note  Patient Details  Name: Amy [name] MRN: [id number] Date of Birth: 1928-01-30 Referring Provider:     Pulmonary Rehab from 03/04/2016 in [facility] Cardiac and Pulmonary Rehab  Referring Provider  Gollan      Encounter Date: 06/27/2016  Check In:     Session Check In - 06/27/16 1118      Check-In   Location [facility]-Cardiac & Pulmonary Rehab   Staff Present Gerlene Burdock, RN, Vickki Hearing, BA, ACSM CEP, Exercise Physiologist;Other  Darel Hong, RN   Supervising physician immediately available to respond to emergencies [facility] immediately available ER MD   Physician(s) Clearnce Hasten and Corky Downs   Medication changes reported     No   Fall or balance concerns reported    No   Tobacco Cessation No Change   Warm-up and Cool-down Performed as group-led instruction   Resistance Training Performed Yes   VAD Patient? No     Pain Assessment   Currently in Pain? No/denies         History  Smoking Status  . Never Smoker  Smokeless Tobacco  . Never Used    Goals Met:  Proper associated with RPD/PD & O2 Sat Exercise tolerated well  Goals Unmet:  Not Applicable  Comments:     Dr. Emily Filbert is Medical Director for [place] and [facility].

## 2016-06-30 ENCOUNTER — Encounter: Payer: Medicare Other | Admitting: *Deleted

## 2016-06-30 ENCOUNTER — Encounter: Payer: Self-pay | Admitting: Respiratory Therapy

## 2016-06-30 VITALS — Ht 60.5 in | Wt 218.0 lb

## 2016-06-30 DIAGNOSIS — I5032 Chronic diastolic (congestive) heart failure: Secondary | ICD-10-CM

## 2016-06-30 DIAGNOSIS — I509 Heart failure, unspecified: Secondary | ICD-10-CM | POA: Diagnosis not present

## 2016-06-30 NOTE — Progress Notes (Signed)
Pulmonary Individual Treatment Plan  Patient Details  Name: MAUDENE STOTLER MRN: [id number] Date of Birth: 1928/01/10 Referring Provider:     Pulmonary Rehab from 03/04/2016 in [facility] Cardiac and Pulmonary Rehab  Referring Provider  Gollan      Initial Encounter Date:    Pulmonary Rehab from 03/04/2016 in [facility] Cardiac and Pulmonary Rehab  Date  03/04/16  Referring Provider  Rockey Situ      Visit Diagnosis: Chronic diastolic congestive heart failure ([place])  Patient's Home Medications on Admission:  Current Outpatient Prescriptions:    aspirin 81 MG tablet, Take 81 mg by mouth daily., Disp: , Rfl:    cloNIDine (CATAPRES - DOSED IN MG/24 HR) 0.3 mg/24hr patch, Place 0.3 mg onto the skin once a week. , Disp: , Rfl:    clopidogrel (PLAVIX) 75 MG tablet, Take 1 tablet (75 mg total) by mouth daily., Disp: 90 tablet, Rfl: 3   doxazosin (CARDURA) 8 MG tablet, Take 1 tablet (8 mg total) by mouth 2 (two) times daily., Disp: 180 tablet, Rfl: 3   famotidine (PEPCID) 20 MG tablet, Take 1 tablet (20 mg total) by mouth 2 (two) times daily., Disp: 180 tablet, Rfl: 1   fluticasone (FLONASE) 50 MCG/ACT nasal spray, Place into the nose., Disp: , Rfl:    furosemide (LASIX) 20 MG tablet, Take 1 tablet (20 mg total) by mouth 2 (two) times daily as needed., Disp: 180 tablet, Rfl: 3   furosemide (LASIX) 20 MG tablet, Take 1 tablet (20 mg total) by mouth daily as needed., Disp: 90 tablet, Rfl: 3   glimepiride (AMARYL) 4 MG tablet, Take 4 mg by mouth daily before breakfast., Disp: , Rfl:    ipratropium-albuterol (DUONEB) 0.5-2.5 (3) MG/3ML SOLN, Take 3 mLs by nebulization every 4 (four) hours as needed (shortness of breath/wheezing.)., Disp: 360 mL, Rfl:    levothyroxine (SYNTHROID, LEVOTHROID) 125 MCG tablet, Take 125 mcg by mouth daily., Disp: , Rfl:    losartan-hydrochlorothiazide (HYZAAR) 100-25 MG per tablet, Take 1 tablet by mouth daily.  , Disp: , Rfl:    metFORMIN (GLUCOPHAGE) 1000 MG tablet,  Take 1,000 mg by mouth 2 (two) times daily with a meal., Disp: , Rfl:    Multiple Vitamins-Minerals (PRESERVISION AREDS PO), Take by mouth 2 (two) times daily., Disp: , Rfl:    oxybutynin (DITROPAN) 5 MG tablet, Take 5 mg by mouth 1 dose over 46 hours.  , Disp: , Rfl:    pantoprazole (PROTONIX) 40 MG tablet, Take 40 mg by mouth daily. , Disp: , Rfl:    senna (SENOKOT) 8.6 MG TABS tablet, Take 1 tablet (8.6 mg total) by mouth daily as needed for mild constipation., Disp: 120 each, Rfl: 0   simvastatin (ZOCOR) 40 MG tablet, Take 1 tablet (40 mg total) by mouth at bedtime. (Patient taking differently: Take 40 mg by mouth daily. ), Disp: 90 tablet, Rfl: 3   SYMBICORT 160-4.5 MCG/ACT inhaler, Inhale 2 puffs into the lungs 2 (two) times daily. , Disp: , Rfl:   Past Medical History: Past Medical History:  Diagnosis Date   Bladder incontinence    CAD (coronary artery disease)    a. cardiac cath 05/2009: proximal LAD 90% stenosis s/p PCI/Xience 2.75 x 12 mm DES, mid LAD 40%, diagonal 40%, proximal LCx 40% followed by 40% LCx lesion, 30%-40%-30% lesion noted in the RCA with distal RCA 30% and 25% lesions   Chronic diastolic (congestive) heart failure ([place])    a. Lexiscan 2013: no ischemia, normal EF b.  echo 09/2015: EF 55-60% w/ Grade 1 DD   CKD (chronic kidney disease) stage 3, GFR 30-59 ml/min    Degenerative arthritis of knee    bilateral knees   Diabetes mellitus    Type II   Hiatal hernia    Hypertension    Iron deficiency    Menopausal symptoms    Morbid obesity (HCC)    Thyroid disease    hypothyroidism    Tobacco Use: History  Smoking Status   Never Smoker  Smokeless Tobacco   Never Used    Labs: Recent Review Flowsheet Data    Labs for ITP Cardiac and Pulmonary Rehab Latest Ref Rng & Units 05/07/2009 10/18/2009 06/25/2010 10/15/2012 10/01/2015   Cholestrol 0 - 200 mg/dL 148 147 135 - -   LDLCALC 0 - 99 mg/dL 80 80 56 - -   HDL >39 mg/dL 29.2 40 35(L) - -    Trlycerides <150 mg/dL 194 133 220(H) - -   Hemoglobin A1c 4.0 - 6.0 % 6.3 - - 8.6(H) 5.6       ADL UCSD:     Pulmonary Assessment Scores    Row Name 03/04/16 1208 04/30/16 1000 06/27/16 1243     ADL UCSD   ADL Phase Entry Mid Exit   SOB Score total 76 78 88   Rest 0 2 2   Walk 0 4 4   Stairs _0 Bath _1 Dress _2 Shop _3 Pulmonary Function Assessment:     Pulmonary Function Assessment - 03/04/16 1207      Initial Spirometry Results   FVC% 107 %   FEV1% 130 %   FEV1/FVC Ratio 89.63     Breath   Bilateral Breath Sounds Clear   Shortness of Breath Yes;Limiting activity;Fear of Shortness of Breath      Exercise Target Goals:    Exercise Program Goal: Individual exercise prescription set with THRR, safety & activity barriers. Participant demonstrates ability to understand and report RPE using BORG scale, to self-measure pulse accurately, and to acknowledge the importance of the exercise prescription.  Exercise Prescription Goal: Starting with aerobic activity 30 plus minutes a day, 3 days per week for initial exercise prescription. Provide home exercise prescription and guidelines that participant acknowledges understanding prior to discharge.  Activity Barriers & Risk Stratification:     Activity Barriers & Cardiac Risk Stratification - 03/04/16 1206      Activity Barriers & Cardiac Risk Stratification   Activity Barriers Shortness of Breath;Deconditioning;Muscular Weakness   Cardiac Risk Stratification Moderate      6 Minute Walk:     6 Minute Walk    Row Name 03/04/16 1223 04/28/16 1136       6 Minute Walk   Phase  -- Mid Program    Distance 90 feet 230 feet    Walk Time 1 minutes 2.5 minutes    # of Rest Breaks 2 3    MPH 0.9 1    METS 1.7 1.8    RPE 17 15    Perceived Dyspnea  4 3    VO2 Peak 5.9 6.2    Symptoms Yes (comment) No    Comments short of breath  --    Resting HR 89 bpm 84 bpm    Resting BP 122/52 152/84     Max Ex. HR 112 bpm 110 bpm    Max Ex. BP 152/42  152/64      Interval HR   Baseline HR  -- 84    1 Minute HR  -- 104    2 Minute HR  -- 92  resting    3 Minute HR  -- 110    4 Minute HR  -- 90  resting    5 Minute HR  -- 100    6 Minute HR  -- 99    2 Minute Post HR  -- 94    Interval Heart Rate?  -- Yes      Interval Oxygen   Interval Oxygen?  -- Yes    Baseline Oxygen Saturation %  -- 97 %    Baseline Liters of Oxygen  -- 0 L    1 Minute Oxygen Saturation %  -- 97 %    1 Minute Liters of Oxygen  -- 0 L    2 Minute Oxygen Saturation %  -- 100 %    2 Minute Liters of Oxygen  -- 0 L    3 Minute Oxygen Saturation %  -- 96 %    3 Minute Liters of Oxygen  -- 0 L    4 Minute Oxygen Saturation %  -- 99 %    4 Minute Liters of Oxygen  -- 0 L    5 Minute Oxygen Saturation %  -- 99 %    5 Minute Liters of Oxygen  -- 0 L    6 Minute Oxygen Saturation %  -- 93 %    6 Minute Liters of Oxygen  -- 0 L    2 Minute Post Oxygen Saturation %  -- 99 %    2 Minute Post Liters of Oxygen  -- 0 L      Oxygen Initial Assessment:   Oxygen Re-Evaluation:   Oxygen Discharge (Final Oxygen Re-Evaluation):   Initial Exercise Prescription:     Initial Exercise Prescription - 03/04/16 1200      Date of Initial Exercise RX and Referring Provider   Date 03/04/16   Referring Provider Gollan     Treadmill   MPH 0.5   Grade 0   Minutes 15  1/1/1/1     NuStep   Level 1   Minutes 15   METs 1.5     T5 Nustep   Level 1   Minutes 15   METs 1.5     Biostep-RELP   Level 1   Minutes 15   METs 1.5     Prescription Details   Frequency (times per week) 3   Duration Progress to 30 minutes of continuous aerobic without signs/symptoms of physical distress     Intensity   THRR 40-80% of Max Heartrate 107-125   Ratings of Perceived Exertion 11-13   Perceived Dyspnea 0-4     Progression   Progression Continue to progress workloads to maintain intensity without signs/symptoms of  physical distress.     Resistance Training   Training Prescription Yes   Weight 1   Reps 10-15      Perform Capillary Blood Glucose checks as needed.  Exercise Prescription Changes:     Exercise Prescription Changes    Row Name 03/10/16 1100 03/28/16 0900 04/10/16 1100 04/16/16 1200 04/24/16 1100     Response to Exercise   Blood Pressure (Admit) 164/60 124/60 138/56  -- 148/60   Blood Pressure (Exercise) 168/58 126/64 128/64  -- 106/74   Blood Pressure (Exit) 148/62 134/80 104/58  -- 120/60   Heart Rate (  Admit) 103 bpm 95 bpm 97 bpm  -- 85 bpm   Heart Rate (Exercise) 93 bpm 89 bpm 93 bpm  -- 92 bpm   Heart Rate (Exit) 88 bpm 91 bpm 89 bpm  -- 97 bpm   Oxygen Saturation (Admit) 98 % 97 % 98 %  -- 99 %   Oxygen Saturation (Exercise) 98 % 95 % 95 %  -- 97 %   Oxygen Saturation (Exit) 97 % 96 % 98 %  -- 98 %   Rating of Perceived Exertion (Exercise) _0 -- 15   Perceived Dyspnea (Exercise) _1 -- 3   Duration  -- Progress to 45 minutes of aerobic exercise without signs/symptoms of physical distress Progress to 45 minutes of aerobic exercise without signs/symptoms of physical distress  -- Progress to 45 minutes of aerobic exercise without signs/symptoms of physical distress   Intensity  -- THRR unchanged THRR unchanged  -- THRR unchanged     Progression   Progression  -- Continue to progress workloads to maintain intensity without signs/symptoms of physical distress. Continue to progress workloads to maintain intensity without signs/symptoms of physical distress.  -- Continue to progress workloads to maintain intensity without signs/symptoms of physical distress.     Resistance Training   Training Prescription Yes Yes Yes  -- Yes   Weight _2 -- 2   Reps 10-15 10-15 10-12  -- 10-15     Interval Training   Interval Training  --  -- No  -- No     NuStep   Level _3 -- 3   Minutes _4 -- 15   METs 1.5 1.2  --  -- 1.6     T5 Nustep   Level 1  -- 1  -- 1    Minutes _5 -- 15   METs 1.5 1.7 1.8  -- 1.6     Home Exercise Plan   Plans to continue exercise at  --  --  -- [employer]  --   Frequency  --  --  -- Add 1 additional day to program exercise sessions.  --   Row Name 05/09/16 1200 05/21/16 1200 06/05/16 1000 06/18/16 1400       Response to Exercise   Blood Pressure (Admit) 138/60 146/64 138/70 132/60    Blood Pressure (Exercise) 162/68 160/64 126/56 142/80    Blood Pressure (Exit) 128/54 130/62 134/66 142/50    Heart Rate (Admit) 62 bpm 90 bpm 71 bpm 83 bpm    Heart Rate (Exercise) 84 bpm 95 bpm 75 bpm 82 bpm    Heart Rate (Exit) 89 bpm 96 bpm 58 bpm 76 bpm    Oxygen Saturation (Admit) 100 % 100 % 98 % 96 %    Oxygen Saturation (Exercise) 97 % 97 % 95 % 96 %    Oxygen Saturation (Exit) 98 % 99 % 94 % 97 %    Rating of Perceived Exertion (Exercise) _6 Perceived Dyspnea (Exercise) _7 Duration Progress to 45 minutes of aerobic exercise without signs/symptoms of physical distress Progress to 45 minutes of aerobic exercise without signs/symptoms of physical distress Progress to 45 minutes of aerobic exercise without signs/symptoms of physical distress Progress to 45 minutes of aerobic exercise without signs/symptoms of physical distress    Intensity THRR unchanged THRR unchanged THRR unchanged THRR unchanged  Progression   Progression Continue to progress workloads to maintain intensity without signs/symptoms of physical distress. Continue to progress workloads to maintain intensity without signs/symptoms of physical distress. Continue to progress workloads to maintain intensity without signs/symptoms of physical distress. Continue to progress workloads to maintain intensity without signs/symptoms of physical distress.      Resistance Training   Training Prescription Yes Yes Yes Yes    Weight _0 Reps 10-15 10-15 10-15 10-15      Interval Training   Interval Training  --  -- No No      NuStep    Level _1 Minutes _2 METs 1.6 1.6 1.5 1.5      T5 Nustep   Level 5 1  -- 1    Minutes 15 15  -- 15    METs 1.8  --  -- 1.8      Track   Laps  -- 6 7.5 7       Exercise Comments:     Exercise Comments    Row Name 03/10/16 1149 03/28/16 0952 04/02/16 1128 04/10/16 1202 04/16/16 1258   Exercise Comments First full day of exercise!  Patient was oriented to gym and equipment including functions, settings, policies, and procedures.  Patient's individual exercise prescription and treatment plan were reviewed.  All starting workloads were established based on the results of the 6 minute walk test done at initial orientation visit.  The plan for exercise progression was also introduced and progression will be customized based on patient's performance and goals San has been able to walk in one minute intervals on the TM. Rechy moved up to 1.3 mph and did 2 min intervals today!! Chloeanne continues to add time to her treadmill walking.  She has missed a couple days due to an eye injection but returned 2/14. Anjelika does some walking at home currently and is considering Financial [employer] when she graduates LW.  She still is short of breath; we discussed the importance of exercise and walking for ADLs as well as practicing breathing techniques.  Staff also encouraged her to stay for education to learn to better manage her CHF.   [place] Name 04/24/16 1121 04/28/16 1147 05/09/16 1204 05/21/16 1254 06/05/16 1011   Exercise Comments Marka continues to work on adding time to her TM walking. Mid 6 min walk test was done with Lillianne today. Results and progresss were discussed with her.  Chelsae is showing steady progress in her functional capacity. [place] is doing well walking with a walker insead of the TM. Staff members encourage Loza to continue to walk more laps.   [place] Name 06/09/16 1159 06/18/16 1425         Exercise Comments Talulah walked 7 laps around the gym today which is an increase from her usual  6 laps. She has a goal of being able to walk more in order to be able to go to the grocery store.  Ikesha has not attended since 06/09/16.         Exercise Goals and Review:   Exercise Goals Re-Evaluation :   Discharge Exercise Prescription (Final Exercise Prescription Changes):     Exercise Prescription Changes - 06/18/16 1400      Response to Exercise   Blood Pressure (Admit) 132/60   Blood Pressure (Exercise) 142/80   Blood Pressure (Exit) 142/50   Heart Rate (Admit) 83 bpm   Heart  Rate (Exercise) 82 bpm   Heart Rate (Exit) 76 bpm   Oxygen Saturation (Admit) 96 %   Oxygen Saturation (Exercise) 96 %   Oxygen Saturation (Exit) 97 %   Rating of Perceived Exertion (Exercise) 15   Perceived Dyspnea (Exercise) 3   Duration Progress to 45 minutes of aerobic exercise without signs/symptoms of physical distress   Intensity THRR unchanged     Progression   Progression Continue to progress workloads to maintain intensity without signs/symptoms of physical distress.     Resistance Training   Training Prescription Yes   Weight 2   Reps 10-15     Interval Training   Interval Training No     NuStep   Level 5   Minutes 15   METs 1.5     T5 Nustep   Level 1   Minutes 15   METs 1.8     Track   Laps 7      Nutrition:  Target Goals: Understanding of nutrition guidelines, daily intake of sodium <1572m, cholesterol <2013m calories 30% from fat and 7% or less from saturated fats, daily to have 5 or more servings of fruits and vegetables.  Biometrics:     Pre Biometrics - 03/10/16 1143      Pre Biometrics   Waist Circumference 45 inches   Hip Circumference 56.5 inches   Waist to Hip Ratio 0.8 %       Nutrition Therapy Plan and Nutrition Goals:   Nutrition Discharge: Rate Your Plate Scores:   Nutrition Goals Re-Evaluation:   Nutrition Goals Discharge (Final Nutrition Goals Re-Evaluation):   Psychosocial: Target Goals: Acknowledge presence or absence of  significant depression and/or stress, maximize coping skills, provide positive support system. Participant is able to verbalize types and ability to use techniques and skills needed for reducing stress and depression.   Initial Review & Psychosocial Screening:     Initial Psych Review & Screening - 03/04/16 [place]   Comments Ms TrGladwinas good support from her daughter and son, although the [place] who lives in [place] the [place] with errands and household chores. She has sadness over the death of her one daughter who died of a stroke. Ms TrPalmers looking forward to walking more and being nore active.     Barriers   Psychosocial barriers to participate in program The patient should benefit from training in stress management and relaxation.     Screening Interventions   Interventions Encouraged to exercise;Program counselor consult      Quality of Life Scores:     Quality of Life - 06/27/16 1242      Quality of Life Scores   Health/Function Post 10.91 %   Socioeconomic Post 22.5 %   Psych/Spiritual Post 30.4 %   Family Post 26 %   GLOBAL Post 16.96 %      PHQ-9: Recent Review Flowsheet Data    [facility] 06/27/2016 03/04/2016   Decreased Interest 1 3   Down, Depressed, Hopeless 1 2   PHQ - 2 Score 2 5   Altered sleeping 1 2   Tired, decreased energy 1 3   Change in appetite 1 2   Feeling bad or failure about yourself  0 1   Trouble concentrating 0 1   Moving slowly or fidgety/restless 0 0   Suicidal thoughts 0 0   PHQ-9 Score 5 14   Difficult doing work/chores  Somewhat difficult Somewhat difficult     Interpretation of Total Score  Total Score Depression Severity:  1-4 = Minimal depression, 5-9 = Mild depression, 10-14 = Moderate depression, 15-19 = Moderately severe depression, 20-27 = Severe depression   Psychosocial Evaluation and Intervention:     Psychosocial Evaluation - 03/26/16 1044       Psychosocial Evaluation & Interventions   Comments Counselor met with Ms. Nonaka today for initial psychosocial evaluation.  She just turned 83 years old last week and has been diagnosed with Congestive Heart Failure.  Ms. Aveni lives alone but has several adult children who check on her frequently and she also is somewhat involved in her local church.  Ms. Kendra states her sleep in interrupted often with bathroom visits, etc, but that she typically gets around 6 hours/night total.  Her appetite is good also.  She denies a history of depression and anxiety but has some current symptoms since the loss of her 34 year old daughter several years ago due to a stroke.  This daughter was the primary caregiver for Ms. Apperson so this has had a tremendous impact on her.  She states her mood is stable and impacted by her own health and daughters death.  Her stress is her health and her limited ability to do things on her own.  She has goals to walk better and breathe better; as well as improved sleep.  Counselor encouraged Ms. Marcelli to consider either individual counseling or a grief support group; but she declined and stated this is not something she is interested in doing.  Counselor and staff will monitor Ms. Lumbra's progress on her goals throughout the course of this program.  She will benefit from the depression and stress management psychoeducational components of this program.     Continue Psychosocial Services  Yes  Monitor mood and sleep.      Psychosocial Re-Evaluation:     Psychosocial Re-Evaluation    [place] Name 04/23/16 1456 05/21/16 1504 06/02/16 1100         Psychosocial Re-Evaluation   Comments Ms Moe states today how much she is enjoying [facility] and has made improvements in her activity levels at home. It is very noticeable her improved walking ability in the gym .  She is more involved in the class as for as socializing with the other participants. I have  encouraged her to stay for education. Today Ms Welte stated her interest in the class of [employer].  -- Counselor follow up today with Montasia reporting some improvement with feeling stronger and able to walk a little further without having to stop to catch her breath as often.  She continues to struggle with sleep but reports she is getting at least 5 hours most nights and occasionally will take a nap.  Ingris expressed some concern with getting to/from her eye appointment since there is no valet parking there.  Counselor encouraged her to check with her church congregation and staff will provide her with the number for public transportation for her to contact for this medical appointment.  Camri reports she enjoys this program and it helps her get out of the house and become more independent.  She continues to grieve the loss of her daughter -and that is expected since it is so recent.  Counselor and staff will continue to provide supportive services for [place].       Expected Outcomes  -- Ms Ginther is progressing with her exercise goals and has noticed improvements  with walking a home. She now can park her car on the driveway, instead of the grass by the door. This all is very positive for her, for she wants to live independently. Ms Mackert enjoys the socialization with the other participants and attends regularly.  Lacie will continue to exercise consistently and will reach out for the medical transportation needs she has for assistance.  Staff will continue to provide support in these areas.         Psychosocial Discharge (Final Psychosocial Re-Evaluation):     Psychosocial Re-Evaluation - 06/02/16 1100      Psychosocial Re-Evaluation   Comments Counselor follow up today with Dioselina reporting some improvement with feeling stronger and able to walk a little further without having to stop to catch her breath as often.  She continues to struggle with sleep but reports she is getting at  least 5 hours most nights and occasionally will take a nap.  Jannie expressed some concern with getting to/from her eye appointment since there is no valet parking there.  Counselor encouraged her to check with her church congregation and staff will provide her with the number for public transportation for her to contact for this medical appointment.  Chey reports she enjoys this program and it helps her get out of the house and become more independent.  She continues to grieve the loss of her daughter -and that is expected since it is so recent.  Counselor and staff will continue to provide supportive services for [place].     Expected Outcomes Tamecka will continue to exercise consistently and will reach out for the medical transportation needs she has for assistance.  Staff will continue to provide support in these areas.       Education: Education Goals: Education classes will be provided on a weekly basis, covering required topics. Participant will state understanding/return demonstration of topics presented.  Learning Barriers/Preferences:     Learning Barriers/Preferences - 03/04/16 1206      Learning Barriers/Preferences   Learning Barriers None   Learning Preferences None      Education Topics: Initial Evaluation Education: - Verbal, written and demonstration of respiratory meds, RPE/PD scales, oximetry and breathing techniques. Instruction on use of nebulizers and MDIs: cleaning and proper use, rinsing mouth with steroid doses and importance of monitoring MDI activations.   Pulmonary Rehab from 06/25/2016 in [facility] Cardiac and Pulmonary Rehab  Date  03/04/16  Educator  LB  Instruction Review Code  2- meets goals/outcomes      General Nutrition Guidelines/Fats and Fiber: -Group instruction provided by verbal, written material, models and posters to present the general guidelines for heart healthy nutrition. Gives an explanation and review of dietary fats and fiber.   Pulmonary Rehab  from 06/25/2016 in [facility] Cardiac and Pulmonary Rehab  Date  06/23/16  Educator  CR  Instruction Review Code  2- meets goals/outcomes      Controlling Sodium/Reading Food Labels: -Group verbal and written material supporting the discussion of sodium use in heart healthy nutrition. Review and explanation with models, verbal and written materials for utilization of the food label.   Exercise Physiology & Risk Factors: - Group verbal and written instruction with models to review the exercise physiology of the cardiovascular system and associated critical values. Details cardiovascular disease risk factors and the goals associated with each risk factor.   Aerobic Exercise & Resistance Training: - Gives group verbal and written discussion on the health impact of inactivity. On the components of aerobic and  resistive training programs and the benefits of this training and how to safely progress through these programs.   Pulmonary Rehab from 06/25/2016 in [facility] Cardiac and Pulmonary Rehab  Date  03/26/16  Educator  [facility]  Instruction Review Code  2- meets goals/outcomes      Flexibility, Balance, General Exercise Guidelines: - Provides group verbal and written instruction on the benefits of flexibility and balance training programs. Provides general exercise guidelines with specific guidelines to those with heart or lung disease. Demonstration and skill practice provided.   Stress Management: - Provides group verbal and written instruction about the health risks of elevated stress, cause of high stress, and healthy ways to reduce stress.   Depression: - Provides group verbal and written instruction on the correlation between heart/lung disease and depressed mood, treatment options, and the stigmas associated with seeking treatment.   Exercise & Equipment Safety: - Individual verbal instruction and demonstration of equipment use and safety with use of the equipment.   Pulmonary Rehab from  06/25/2016 in [facility] Cardiac and Pulmonary Rehab  Date  05/30/16  Educator  C. Barbee Shropshire and I had a long discussion about her being able to ex]  Instruction Review Code  2- meets goals/outcomes      Infection Prevention: - Provides verbal and written material to individual with discussion of infection control including proper hand washing and proper equipment cleaning during exercise session.   Pulmonary Rehab from 06/25/2016 in [facility] Cardiac and Pulmonary Rehab  Date  03/10/16  Educator  AS  Instruction Review Code  2- meets goals/outcomes      Falls Prevention: - Provides verbal and written material to individual with discussion of falls prevention and safety.   Pulmonary Rehab from 06/25/2016 in [facility] Cardiac and Pulmonary Rehab  Date  03/04/16  Educator  LB  Instruction Review Code  2- meets goals/outcomes      Diabetes: - Individual verbal and written instruction to review signs/symptoms of diabetes, desired ranges of glucose level fasting, after meals and with exercise. Advice that pre and post exercise glucose checks will be done for 3 sessions at entry of program.   Chronic Lung Diseases: - Group verbal and written instruction to review new updates, new respiratory medications, new advancements in procedures and treatments. Provide informative websites and "800" numbers of self-education.   Pulmonary Rehab from 06/25/2016 in [facility] Cardiac and Pulmonary Rehab  Date  06/04/16  Educator  LB  Instruction Review Code  2- meets goals/outcomes      Lung Procedures: - Group verbal and written instruction to describe testing methods done to diagnose lung disease. Review the outcome of test results. Describe the treatment choices: Pulmonary Function Tests, ABGs and oximetry.   Energy Conservation: - Provide group verbal and written instruction for methods to conserve energy, plan and organize activities. Instruct on pacing techniques, use of adaptive equipment and  posture/positioning to relieve shortness of breath.   Pulmonary Rehab from 06/25/2016 in [facility] Cardiac and Pulmonary Rehab  Date  04/23/16  Educator  [facility]  Instruction Review Code  2- meets goals/outcomes      Triggers: - Group verbal and written instruction to review types of environmental controls: home humidity, furnaces, filters, dust mite/pet prevention, HEPA vacuums. To discuss weather changes, air quality and the benefits of nasal washing.   Exacerbations: - Group verbal and written instruction to provide: warning signs, infection symptoms, calling MD promptly, preventive modes, and value of vaccinations. Review: effective airway clearance, coughing and/or vibration techniques. Create  an Sports administrator.   Pulmonary Rehab from 06/25/2016 in [facility] Cardiac and Pulmonary Rehab  Date  04/09/16  Educator  LB  Instruction Review Code  2- meets goals/outcomes      Oxygen: - Individual and group verbal and written instruction on oxygen therapy. Includes supplement oxygen, available portable oxygen systems, continuous and intermittent flow rates, oxygen safety, concentrators, and Medicare reimbursement for oxygen.   Respiratory Medications: - Group verbal and written instruction to review medications for lung disease. Drug class, frequency, complications, importance of spacers, rinsing mouth after steroid MDI's, and proper cleaning methods for nebulizers.   AED/CPR: - Group verbal and written instruction with the use of models to demonstrate the basic use of the AED with the basic ABC's of resuscitation.   Pulmonary Rehab from 06/25/2016 in [facility] Cardiac and Pulmonary Rehab  Date  06/06/16 [facility] teaching with Erion about AED from 10:05 am to 10:20am. ]  Educator  C. EnterkinRN  Instruction Review Code  2- meets goals/outcomes      Breathing Retraining: - Provides individuals verbal and written instruction on purpose, frequency, and proper technique of diaphragmatic breathing and pursed-lipped  breathing. Applies individual practice skills.   Pulmonary Rehab from 06/25/2016 in [facility] Cardiac and Pulmonary Rehab  Date  03/10/16  Educator  AS  Instruction Review Code  2- meets goals/outcomes      Anatomy and Physiology of the Lungs: - Group verbal and written instruction with the use of models to provide basic lung anatomy and physiology related to function, structure and complications of lung disease.   Heart Failure: - Group verbal and written instruction on the basics of heart failure: signs/symptoms, treatments, explanation of ejection fraction, enlarged heart and cardiomyopathy.   Sleep Apnea: - Individual verbal and written instruction to review Obstructive Sleep Apnea. Review of risk factors, methods for diagnosing and types of masks and machines for OSA.   Anxiety: - Provides group, verbal and written instruction on the correlation between heart/lung disease and anxiety, treatment options, and management of anxiety.   Relaxation: - Provides group, verbal and written instruction about the benefits of relaxation for patients with heart/lung disease. Also provides patients with examples of relaxation techniques.   Pulmonary Rehab from 06/25/2016 in [facility] Cardiac and Pulmonary Rehab  Date  06/25/16  Educator  [facility]  Instruction Review Code  2- Meets goals/outcomes      Knowledge Questionnaire Score:     Knowledge Questionnaire Score - 06/25/16 1053      Knowledge Questionnaire Score   Post Score 10/10       Core Components/Risk Factors/Patient Goals at Admission:     Personal Goals and Risk Factors at Admission - 03/04/16 1212      Core Components/Risk Factors/Patient Goals on Admission   Sedentary Yes  Goal: improve walking   Intervention Provide advice, education, support and counseling about physical activity/exercise needs.;Develop an individualized exercise prescription for aerobic and resistive training based on initial evaluation findings, risk  stratification, comorbidities and participant's personal goals.   Expected Outcomes Achievement of increased cardiorespiratory fitness and enhanced flexibility, muscular endurance and strength shown through measurements of functional capacity and personal statement of participant.   Increase Strength and Stamina Yes   Intervention Provide advice, education, support and counseling about physical activity/exercise needs.;Develop an individualized exercise prescription for aerobic and resistive training based on initial evaluation findings, risk stratification, comorbidities and participant's personal goals.   Expected Outcomes Achievement of increased cardiorespiratory fitness and enhanced flexibility, muscular endurance and strength shown  through measurements of functional capacity and personal statement of participant.   Improve shortness of breath with ADL's Yes   Intervention Provide education, individualized exercise plan and daily activity instruction to help decrease symptoms of SOB with activities of daily living.   Expected Outcomes Short Term: Achieves a reduction of symptoms when performing activities of daily living.   Develop more efficient breathing techniques such as purse lipped breathing and diaphragmatic breathing; and practicing self-pacing with activity Yes   Intervention Provide education, demonstration and support about specific breathing techniuqes utilized for more efficient breathing. Include techniques such as pursed lipped breathing, diaphragmatic breathing and self-pacing activity.   Expected Outcomes Short Term: Participant will be able to demonstrate and use breathing techniques as needed throughout daily activities.   Increase knowledge of respiratory medications and ability to use respiratory devices properly  Yes   Intervention Provide education and demonstration as needed of appropriate use of medications, inhalers, and oxygen therapy.  Duoneb SVN, Symbicort, Flutter  Valve, Incentive Spirometer   Expected Outcomes Short Term: Achieves understanding of medications use. Understands that oxygen is a medication prescribed by physician. Demonstrates appropriate use of inhaler and oxygen therapy.   Diabetes Yes   Intervention Provide education about signs/symptoms and action to take for hypo/hyperglycemia.;Provide education about proper nutrition, including hydration, and aerobic/resistive exercise prescription along with prescribed medications to achieve blood glucose in normal ranges: Fasting glucose 65-99 mg/dL   Expected Outcomes Short Term: Participant verbalizes understanding of the signs/symptoms and immediate care of hyper/hypoglycemia, proper foot care and importance of medication, aerobic/resistive exercise and nutrition plan for blood glucose control.;Long Term: Attainment of HbA1C < 7%.   Heart Failure Yes   Intervention Provide a combined exercise and nutrition program that is supplemented with education, support and counseling about heart failure. Directed toward relieving symptoms such as shortness of breath, decreased exercise tolerance, and extremity edema.   Expected Outcomes Improve functional capacity of life;Short term: Attendance in program 2-3 days a week with increased exercise capacity. Reported lower sodium intake. Reported increased fruit and vegetable intake. Reports medication compliance.;Short term: Daily weights obtained and reported for increase. Utilizing diuretic protocols set by physician.;Long term: Adoption of self-care skills and reduction of barriers for early signs and symptoms recognition and intervention leading to self-care maintenance.   Hypertension Yes   Intervention Provide education on lifestyle modifcations including regular physical activity/exercise, weight management, moderate sodium restriction and increased consumption of fresh fruit, vegetables, and low fat dairy, alcohol moderation, and smoking cessation.;Monitor  prescription use compliance.   Expected Outcomes Short Term: Continued assessment and intervention until BP is < 140/42m HG in hypertensive participants. < 130/820mHG in hypertensive participants with diabetes, heart failure or chronic kidney disease.;Long Term: Maintenance of blood pressure at goal levels.   Lipids Yes   Intervention Provide education and support for participant on nutrition & aerobic/resistive exercise along with prescribed medications to achieve LDL <7011mHDL >64m15m Expected Outcomes Short Term: Participant states understanding of desired cholesterol values and is compliant with medications prescribed. Participant is following exercise prescription and nutrition guidelines.;Long Term: Cholesterol controlled with medications as prescribed, with individualized exercise RX and with personalized nutrition plan. Value goals: LDL < 70mg36mL > 40 mg.      Core Components/Risk Factors/Patient Goals Review:      Goals and Risk Factor Review    Row Name 03/10/16 1143 03/21/16 1147 04/14/16 1146 05/21/16 1251 05/26/16 1621     Core Components/Risk Factors/Patient Goals Review  Personal Goals Review Develop more efficient breathing techniques such as purse lipped breathing and diaphragmatic breathing and practicing self-pacing with activity. Weight Management/Obesity;Improve shortness of breath with ADL's;Heart Failure Sedentary;Improve shortness of breath with ADL's;Increase Strength and Stamina;Develop more efficient breathing techniques such as purse lipped breathing and diaphragmatic breathing and practicing self-pacing with activity. Improve shortness of breath with ADL's;Heart Failure Improve shortness of breath with ADL's;Develop more efficient breathing techniques such as purse lipped breathing and diaphragmatic breathing and practicing self-pacing with activity.   Review Pursed lip breathing was reviewed with the patient. She demonstrated understanding of this breathing  technique.  Gloriana has not been monitoring her weight as she does not have a scale at home.  Daily and weekly numbers were reviewed as well as signs of CHF.  She has felt tired since starting LW but feels practicing PLB helps her breathe better. Javona states she can get around a little better since beginning exercise.  Her weight has been stable and even down a few pounds since beginning [place].  She still feels walking the TM is hard breathing wise, so we reviewed PLB and she was able to walk 1:30 on the TM 2 times. Kallista was able to get scales throught the [facility] to keep at home.  She couldnt get them to work but has family to help.  She knows she has fluid retention problems but was not sure what limits her Dr wants her to use when weighing herself.  She can walk from her house to her car without SOB.  She still stops to rest with ADLs but can "do more than when she started." Ms Lockamy complained of shortness of breath today -  Qued her to perform PLB which does help her. She now parks her car in the driveway, instead of on the grass by her door. Ms Jawad is becoming more independent in class with equipment set-up and recording her O2Sat' and [place].   Expected Outcomes Patient will use PLB to with exercise and ADL's to help control SOB and improve O2 Sats.  Staff will check on getting her a scale throught the [facility] to monitor weight.  Regular participation in [place]  will help Shary increase oveall stamina and better manage her CHF. Salli will continue to improve her stamina. Tarrin will better manage her fluid retention adn CHF with the scales and continue to improve SOB with ADLs. Continue exercising to increase her stamina and use PLB with activity   Row Name 06/25/16 1507             Core Components/Risk Factors/Patient Goals Review   Personal Goals Review Weight Management/Obesity;Improve shortness of breath with ADL's;Increase knowledge of respiratory medications and ability to  use respiratory devices properly.;Develop more efficient breathing techniques such as purse lipped breathing and diaphragmatic breathing and practicing self-pacing with activity.;Hypertension;Heart Failure;Lipids       Review Ms Berrios is close to graduation. She states her strength has improved and is walking longer distances at home. Ms Strong is looking forward to continuing her exercise and plans to join [employer]. Her shortness of breath is a struggle, but she has learned to pace herself and use PLB. For weight control, she is eating smaller meals more frequently throughout the day and does like fruit. Her blood pressure has been acceptable in [facility], and she is compliant with her medications. She has a good understanding of her Symbicort and Duoneb. Ms Rappleye is commended for her regular attendence and hard work  in [place].       Expected Outcomes Continue exercise after [facility] in [employer] and continue self-management of her CHF through the education she has learned in the program.          Core Components/Risk Factors/Patient Goals at Discharge (Final Review):      Goals and Risk Factor Review - 06/25/16 1507      Core Components/Risk Factors/Patient Goals Review   Personal Goals Review Weight Management/Obesity;Improve shortness of breath with ADL's;Increase knowledge of respiratory medications and ability to use respiratory devices properly.;Develop more efficient breathing techniques such as purse lipped breathing and diaphragmatic breathing and practicing self-pacing with activity.;Hypertension;Heart Failure;Lipids   Review Ms Higdon is close to graduation. She states her strength has improved and is walking longer distances at home. Ms Worrall is looking forward to continuing her exercise and plans to join [employer]. Her shortness of breath is a struggle, but she has learned to pace herself and use PLB. For weight control, she is eating smaller meals more  frequently throughout the day and does like fruit. Her blood pressure has been acceptable in [facility], and she is compliant with her medications. She has a good understanding of her Symbicort and Duoneb. Ms Dado is commended for her regular attendence and hard work in [employer].   Expected Outcomes Continue exercise after [facility] in [employer] and continue self-management of her CHF through the education she has learned in the program.      ITP Comments:     ITP Comments    Row Name 05/30/16 1121 06/30/16 0837         ITP Comments I discussed today with Amri the option to exercise in the Care Class at 12:15pm on Tuesday and Thursdays. Her doctor who she sees in the [place] for her anemia is going to write the order to clear her to exercise in that class. 11:10-11:21 30 day note review with Dr Emily Filbert, Medical Director of [facility]         Comments: 30 day note review with Dr Emily Filbert, Medical Director of [facility]

## 2016-06-30 NOTE — Progress Notes (Signed)
Daily Session Note  Patient Details  Name: Amy [name] MRN: [id number] Date of Birth: 07-27-1927 Referring Provider:     Pulmonary Rehab from 03/04/2016 in [facility] Cardiac and Pulmonary Rehab  Referring Provider  Gollan      Encounter Date: 06/30/2016  Check In:     Session Check In - 06/30/16 1141      Check-In   Location [facility]-Cardiac & Pulmonary Rehab   Staff Present Carson Myrtle, BS, RRT, Respiratory Bertis Ruddy, BS, ACSM CEP, Exercise Physiologist;Amanda Oletta Darter, BA, ACSM CEP, Exercise Physiologist   Supervising physician immediately available to respond to emergencies [facility] immediately available ER MD   Physician(s) Burlene Arnt and Jimmye Norman    Medication changes reported     No   Fall or balance concerns reported    No   Warm-up and Cool-down Performed as group-led instruction   Resistance Training Performed Yes   VAD Patient? No     Pain Assessment   Currently in Pain? No/denies   Multiple Pain Sites No         History  Smoking Status  . Never Smoker  Smokeless Tobacco  . Never Used    Goals Met:  Proper associated with RPD/PD & O2 Sat Independence with exercise equipment Exercise tolerated well Personal goals reviewed Strength training completed today  Goals Unmet:  Not Applicable  Comments: Pt able to follow exercise prescription today without complaint.  Will continue to monitor for progression. 6 min walk assessment done with patient today. Results and progress were reviewed with patient.      [place] Name 03/04/16 1223 04/28/16 1136 06/30/16 1142     6 Minute Walk   Phase  - Mid Program Discharge   Distance 90 feet 230 feet 235 feet   Distance % Change  -  - 161 %   Walk Time 1 minutes 2.5 minutes 3 minutes   # of Rest Breaks _0 MPH 0.9 1 0.89   METS 1.7 1.8 1.7   RPE _1 Perceived Dyspnea  _2 VO2 Peak 5.9 6.2 5.9   Symptoms Yes (comment) No Yes (comment)   Comments short of breath  - stopped  to rest due to shortness of breath   Resting HR 89 bpm 84 bpm 85 bpm   Resting BP 122/52 152/84 130/70   Max Ex. HR 112 bpm 110 bpm 85 bpm   Max Ex. BP 152/42 152/64 138/60     Interval HR   Baseline HR  - 84 85   1 Minute HR  - 104 100   2 Minute HR  - 92  resting  -   3 Minute HR  - 110 104   4 Minute HR  - 90  resting  -   5 Minute HR  - 100  -   6 Minute HR  - 99 100   2 Minute Post HR  - 94  -   Interval Heart Rate?  - Yes Yes     Interval Oxygen   Interval Oxygen?  - Yes Yes   Baseline Oxygen Saturation %  - 97 % 98 %   Baseline Liters of Oxygen  - 0 L 0 L   1 Minute Oxygen Saturation %  - 97 % 100 %   1 Minute Liters of Oxygen  - 0 L 0 L   2 Minute Oxygen Saturation %  - 100 %  -  2 Minute Liters of Oxygen  - 0 L  -   3 Minute Oxygen Saturation %  - 96 % 99 %   3 Minute Liters of Oxygen  - 0 L 104 L   4 Minute Oxygen Saturation %  - 99 %  -   4 Minute Liters of Oxygen  - 0 L  -   5 Minute Oxygen Saturation %  - 99 %  -   5 Minute Liters of Oxygen  - 0 L  -   6 Minute Oxygen Saturation %  - 93 % 99 %   6 Minute Liters of Oxygen  - 0 L 0 L   2 Minute Post Oxygen Saturation %  - 99 %  -   2 Minute Post Liters of Oxygen  - 0 L  -        Dr. Emily Filbert is Medical Director for [facility] and [facility].

## 2016-07-02 DIAGNOSIS — I5032 Chronic diastolic (congestive) heart failure: Secondary | ICD-10-CM

## 2016-07-02 DIAGNOSIS — I509 Heart failure, unspecified: Secondary | ICD-10-CM | POA: Diagnosis not present

## 2016-07-02 NOTE — Progress Notes (Signed)
Daily Session Note  Patient Details  Name: Amy [name] MRN: [id number] Date of Birth: [birth year] Referring Provider:     Pulmonary Rehab from 03/04/2016 in [facility] Cardiac and Pulmonary Rehab  Referring Provider  Gollan      Encounter Date: 07/02/2016  Check In:     Session Check In - 07/02/16 1246      Check-In   Location [facility]-Cardiac & Pulmonary Rehab   Staff Present Carson Myrtle, BS, RRT, Respiratory Lennie Hummer, MA, ACSM RCEP, Exercise Physiologist;Amanda Oletta Darter, BA, ACSM CEP, Exercise Physiologist   Supervising physician immediately available to respond to emergencies [facility] immediately available ER MD   Physician(s) Alfred Levins and Jimmye Norman   Medication changes reported     No   Warm-up and Cool-down Performed as group-led Location manager Performed Yes   VAD Patient? No     Pain Assessment   Currently in Pain? No/denies           Exercise Prescription Changes - 07/02/16 1200      Response to Exercise   Blood Pressure (Admit) 160/60   Blood Pressure (Exercise) 148/60   Blood Pressure (Exit) 150/56   Heart Rate (Admit) 84 bpm   Heart Rate (Exercise) 101 bpm   Heart Rate (Exit) 98 bpm   Oxygen Saturation (Admit) 96 %   Oxygen Saturation (Exercise) 90 %   Oxygen Saturation (Exit) 98 %   Rating of Perceived Exertion (Exercise) 15   Perceived Dyspnea (Exercise) 3   Duration Progress to 45 minutes of aerobic exercise without signs/symptoms of physical distress   Intensity THRR unchanged     Progression   Progression Continue to progress workloads to maintain intensity without signs/symptoms of physical distress.     Resistance Training   Training Prescription Yes   Weight 2   Reps 10-15     NuStep   Level 5   Minutes 15   METs 1.4     T5 Nustep   Level 2   Minutes 15   METs 1.7     Track   Laps 7      History  Smoking Status  . Never Smoker  Smokeless Tobacco  . Never Used    Goals Met:  Proper  associated with RPD/PD & O2 Sat Independence with exercise equipment Exercise tolerated well Strength training completed today  Goals Unmet:  Not Applicable  Comments: Pt able to follow exercise prescription today without complaint.  Will continue to monitor for progression.    Dr. Emily Filbert is Medical Director for [place] and [facility].

## 2016-07-03 ENCOUNTER — Telehealth: Payer: Self-pay

## 2016-07-03 NOTE — Telephone Encounter (Signed)
-----   Message from Lloyd Huger, MD sent at 07/03/2016  1:49 PM EDT -----   ----- Message ----- From: Gerlene Burdock, RN Sent: 07/03/2016   1:08 PM To: Lloyd Huger, MD  Dr. Grayland Ormond, Please order a CARE REF so Mykeria can exercise with [place] in the same gym that she has been attending [facility]hab[facility]. Thank you! Gerlene Burdock, R.N. [place] [facility]  [phone]

## 2016-07-04 ENCOUNTER — Encounter: Payer: Medicare Other | Admitting: *Deleted

## 2016-07-04 DIAGNOSIS — I5032 Chronic diastolic (congestive) heart failure: Secondary | ICD-10-CM

## 2016-07-04 DIAGNOSIS — I509 Heart failure, unspecified: Secondary | ICD-10-CM | POA: Diagnosis not present

## 2016-07-04 NOTE — Progress Notes (Signed)
Daily Session Note  Patient Details  Name: Amy [name] MRN: [id number] Date of Birth: Feb 23, 1928 Referring Provider:     Pulmonary Rehab from 03/04/2016 in [facility]  Referring Provider  Gollan      Encounter Date: 07/04/2016  Check In:     Session Check In - 07/04/16 1042      Check-In   Location [facility]-Cardiac & Pulmonary Rehab   Staff Present Gerlene Burdock, RN, Vickki Hearing, BA, ACSM CEP, Exercise Physiologist;Susanne Bice, RN, BSN, CCRP   Supervising physician immediately available to respond to emergencies [facility] immediately available ER MD   Physician(s) Dr. Jacqualine Code and Dr. Alfred Levins   Medication changes reported     No   Fall or balance concerns reported    No   Tobacco Cessation No Change   Warm-up and Cool-down Performed as group-led instruction   Resistance Training Performed Yes     Pain Assessment   Currently in Pain? No/denies         History  Smoking Status  . Never Smoker  Smokeless Tobacco  . Never Used    Goals Met:  Proper associated with RPD/PD & O2 Sat Exercise tolerated well  Goals Unmet:  Not Applicable  Comments:     Dr. Emily Filbert is Medical Director for [place] and [facility].

## 2016-07-07 ENCOUNTER — Encounter: Payer: Medicare Other | Admitting: *Deleted

## 2016-07-07 ENCOUNTER — Telehealth: Payer: Self-pay

## 2016-07-07 DIAGNOSIS — D500 Iron deficiency anemia secondary to blood loss (chronic): Secondary | ICD-10-CM

## 2016-07-07 DIAGNOSIS — J441 Chronic obstructive pulmonary disease with (acute) exacerbation: Secondary | ICD-10-CM

## 2016-07-07 DIAGNOSIS — I5032 Chronic diastolic (congestive) heart failure: Secondary | ICD-10-CM

## 2016-07-07 DIAGNOSIS — I509 Heart failure, unspecified: Secondary | ICD-10-CM | POA: Diagnosis not present

## 2016-07-07 NOTE — Telephone Encounter (Signed)
-----   Message from Lloyd Huger, MD sent at 07/03/2016  1:49 PM EDT -----   ----- Message ----- From: Gerlene Burdock, RN Sent: 07/03/2016   1:08 PM To: Lloyd Huger, MD  Dr. Grayland Ormond, Please order a CARE REF so [name] can exercise with [place] in the same gym that she has been attending Pulmonary [facility]hab[facility]. Thank you! Gerlene Burdock, R.N. [place] [facility]  [phone]

## 2016-07-07 NOTE — Patient Instructions (Signed)
Discharge Instructions  Patient Details  Name: Amy [name] MRN: [id number] Date of Birth: 1927-12-14 Referring Provider:  Minna Merritts, MD   Number of Visits:36 Reason for Discharge:  Patient reached a stable level of exercise. Patient independent in their exercise.  Smoking History:  History  Smoking Status   Never Smoker  Smokeless Tobacco   Never Used    Diagnosis:  Chronic diastolic congestive heart failure (HCC)  Initial Exercise Prescription:     Initial Exercise Prescription - 03/04/16 1200      Date of Initial Exercise RX and Referring Provider   Date 03/04/16   Referring Provider Gollan     Treadmill   MPH 0.5   Grade 0   Minutes 15  1/1/1/1     NuStep   Level 1   Minutes 15   METs 1.5     T5 Nustep   Level 1   Minutes 15   METs 1.5     Biostep-RELP   Level 1   Minutes 15   METs 1.5     Prescription Details   Frequency (times per week) 3   Duration Progress to 30 minutes of continuous aerobic without signs/symptoms of physical distress     Intensity   THRR 40-80% of Max Heartrate 107-125   Ratings of Perceived Exertion 11-13   Perceived Dyspnea 0-4     Progression   Progression Continue to progress workloads to maintain intensity without signs/symptoms of physical distress.     Resistance Training   Training Prescription Yes   Weight 1   Reps 10-15      Discharge Exercise Prescription (Final Exercise Prescription Changes):     Exercise Prescription Changes - 07/02/16 1200      Response to Exercise   Blood Pressure (Admit) 160/60   Blood Pressure (Exercise) 148/60   Blood Pressure (Exit) 150/56   Heart Rate (Admit) 84 bpm   Heart Rate (Exercise) 101 bpm   Heart Rate (Exit) 98 bpm   Oxygen Saturation (Admit) 96 %   Oxygen Saturation (Exercise) 90 %   Oxygen Saturation (Exit) 98 %   Rating of Perceived Exertion (Exercise) 15   Perceived Dyspnea (Exercise) 3   Duration Progress to 45 minutes of aerobic  exercise without signs/symptoms of physical distress   Intensity THRR unchanged     Progression   Progression Continue to progress workloads to maintain intensity without signs/symptoms of physical distress.     Resistance Training   Training Prescription Yes   Weight 2   Reps 10-15     NuStep   Level 5   Minutes 15   METs 1.4     T5 Nustep   Level 2   Minutes 15   METs 1.7     Track   Laps 7      Functional Capacity:     6 Minute Walk    Row Name 03/04/16 1223 04/28/16 1136 06/30/16 1142     6 Minute Walk   Phase  -- Mid Program Discharge   Distance 90 feet 230 feet 235 feet   Distance % Change  --  -- 161 %   Walk Time 1 minutes 2.5 minutes 3 minutes   # of Rest Breaks '2 3 3   '$ MPH 0.9 1 0.89   METS 1.7 1.8 1.7   RPE '17 15 15   '$ Perceived Dyspnea  '4 3 3   '$ VO2 Peak 5.9 6.2 5.9   Symptoms Yes (comment) No Yes (  comment)   Comments short of breath  -- stopped to rest due to shortness of breath   Resting HR 89 bpm 84 bpm 85 bpm   Resting BP 122/52 152/84 130/70   Max Ex. HR 112 bpm 110 bpm 85 bpm   Max Ex. BP 152/42 152/64 138/60     Interval HR   Baseline HR  -- 84 85   1 Minute HR  -- 104 100   2 Minute HR  -- 92  resting  --   3 Minute HR  -- 110 104   4 Minute HR  -- 90  resting  --   5 Minute HR  -- 100  --   6 Minute HR  -- 99 100   2 Minute Post HR  -- 94  --   Interval Heart Rate?  -- Yes Yes     Interval Oxygen   Interval Oxygen?  -- Yes Yes   Baseline Oxygen Saturation %  -- 97 % 98 %   Baseline Liters of Oxygen  -- 0 L 0 L   1 Minute Oxygen Saturation %  -- 97 % 100 %   1 Minute Liters of Oxygen  -- 0 L 0 L   2 Minute Oxygen Saturation %  -- 100 %  --   2 Minute Liters of Oxygen  -- 0 L  --   3 Minute Oxygen Saturation %  -- 96 % 99 %   3 Minute Liters of Oxygen  -- 0 L 104 L   4 Minute Oxygen Saturation %  -- 99 %  --   4 Minute Liters of Oxygen  -- 0 L  --   5 Minute Oxygen Saturation %  -- 99 %  --   5 Minute Liters of Oxygen  -- 0  L  --   6 Minute Oxygen Saturation %  -- 93 % 99 %   6 Minute Liters of Oxygen  -- 0 L 0 L   2 Minute Post Oxygen Saturation %  -- 99 %  --   2 Minute Post Liters of Oxygen  -- 0 L  --      Quality of Life:     Quality of Life - 06/27/16 1242      Quality of Life Scores   Health/Function Post 10.91 %   Socioeconomic Post 22.5 %   Psych/Spiritual Post 30.4 %   Family Post 26 %   GLOBAL Post 16.96 %      Personal Goals: Goals established at orientation with interventions provided to work toward goal.     Personal Goals and Risk Factors at Admission - 03/04/16 1212      Core Components/Risk Factors/Patient Goals on Admission   Sedentary Yes  Goal: improve walking   Intervention Provide advice, education, support and counseling about physical activity/exercise needs.;Develop an individualized exercise prescription for aerobic and resistive training based on initial evaluation findings, risk stratification, comorbidities and participant's personal goals.   Expected Outcomes Achievement of increased cardiorespiratory fitness and enhanced flexibility, muscular endurance and strength shown through measurements of functional capacity and personal statement of participant.   Increase Strength and Stamina Yes   Intervention Provide advice, education, support and counseling about physical activity/exercise needs.;Develop an individualized exercise prescription for aerobic and resistive training based on initial evaluation findings, risk stratification, comorbidities and participant's personal goals.   Expected Outcomes Achievement of increased cardiorespiratory fitness and enhanced flexibility, muscular endurance and strength shown through measurements  of functional capacity and personal statement of participant.   Improve shortness of breath with ADL's Yes   Intervention Provide education, individualized exercise plan and daily activity instruction to help decrease symptoms of SOB with  activities of daily living.   Expected Outcomes Short Term: Achieves a reduction of symptoms when performing activities of daily living.   Develop more efficient breathing techniques such as purse lipped breathing and diaphragmatic breathing; and practicing self-pacing with activity Yes   Intervention Provide education, demonstration and support about specific breathing techniuqes utilized for more efficient breathing. Include techniques such as pursed lipped breathing, diaphragmatic breathing and self-pacing activity.   Expected Outcomes Short Term: Participant will be able to demonstrate and use breathing techniques as needed throughout daily activities.   Increase knowledge of respiratory medications and ability to use respiratory devices properly  Yes   Intervention Provide education and demonstration as needed of appropriate use of medications, inhalers, and oxygen therapy.  Duoneb SVN, Symbicort, Flutter Valve, Incentive Spirometer   Expected Outcomes Short Term: Achieves understanding of medications use. Understands that oxygen is a medication prescribed by physician. Demonstrates appropriate use of inhaler and oxygen therapy.   Diabetes Yes   Intervention Provide education about signs/symptoms and action to take for hypo/hyperglycemia.;Provide education about proper nutrition, including hydration, and aerobic/resistive exercise prescription along with prescribed medications to achieve blood glucose in normal ranges: Fasting glucose 65-99 mg/dL   Expected Outcomes Short Term: Participant verbalizes understanding of the signs/symptoms and immediate care of hyper/hypoglycemia, proper foot care and importance of medication, aerobic/resistive exercise and nutrition plan for blood glucose control.;Long Term: Attainment of HbA1C < 7%.   Heart Failure Yes   Intervention Provide a combined exercise and nutrition program that is supplemented with education, support and counseling about heart failure.  Directed toward relieving symptoms such as shortness of breath, decreased exercise tolerance, and extremity edema.   Expected Outcomes Improve functional capacity of life;Short term: Attendance in program 2-3 days a week with increased exercise capacity. Reported lower sodium intake. Reported increased fruit and vegetable intake. Reports medication compliance.;Short term: Daily weights obtained and reported for increase. Utilizing diuretic protocols set by physician.;Long term: Adoption of self-care skills and reduction of barriers for early signs and symptoms recognition and intervention leading to self-care maintenance.   Hypertension Yes   Intervention Provide education on lifestyle modifcations including regular physical activity/exercise, weight management, moderate sodium restriction and increased consumption of fresh fruit, vegetables, and low fat dairy, alcohol moderation, and smoking cessation.;Monitor prescription use compliance.   Expected Outcomes Short Term: Continued assessment and intervention until BP is < 140/61m HG in hypertensive participants. < 130/860mHG in hypertensive participants with diabetes, heart failure or chronic kidney disease.;Long Term: Maintenance of blood pressure at goal levels.   Lipids Yes   Intervention Provide education and support for participant on nutrition & aerobic/resistive exercise along with prescribed medications to achieve LDL '70mg'$ , HDL >'40mg'$ .   Expected Outcomes Short Term: Participant states understanding of desired cholesterol values and is compliant with medications prescribed. Participant is following exercise prescription and nutrition guidelines.;Long Term: Cholesterol controlled with medications as prescribed, with individualized exercise RX and with personalized nutrition plan. Value goals: LDL < '70mg'$ , HDL > 40 mg.       Personal Goals Discharge:     Goals and Risk Factor Review - 06/25/16 1507      Core Components/Risk Factors/Patient  Goals Review   Personal Goals Review Weight Management/Obesity;Improve shortness of breath with ADL's;Increase knowledge of respiratory medications  and ability to use respiratory devices properly.;Develop more efficient breathing techniques such as purse lipped breathing and diaphragmatic breathing and practicing self-pacing with activity.;Hypertension;Heart Failure;Lipids   Review Ms Mensing is close to graduation. She states her strength has improved and is walking longer distances at home. Ms Helmers is looking forward to continuing her exercise and plans to join [employer]. Her shortness of breath is a struggle, but she has learned to pace herself and use PLB. For weight control, she is eating smaller meals more frequently throughout the day and does like fruit. Her blood pressure has been acceptable in [facility], and she is compliant with her medications. She has a good understanding of her Symbicort and Duoneb. Ms Lesesne is commended for her regular attendence and hard work in [employer].   Expected Outcomes Continue exercise after [facility] in [employer] and continue self-management of her CHF through the education she has learned in the program.      Nutrition & Weight - Outcomes:     Pre Biometrics - 03/10/16 1143      Pre Biometrics   Waist Circumference 45 inches   Hip Circumference 56.5 inches   Waist to Hip Ratio 0.8 %         Post Biometrics - 06/30/16 1232       Post  Biometrics   Height 5' 0.5" (1.537 m)   Weight 218 lb (98.9 kg)   Waist Circumference 45.5 inches   Hip Circumference 55 inches   Waist to Hip Ratio 0.83 %   BMI (Calculated) 42      Nutrition:   Nutrition Discharge:   Education Questionnaire Score:     Knowledge Questionnaire Score - 06/25/16 1053      Knowledge Questionnaire Score   Post Score 10/10      Goals reviewed with patient; copy given to patient.

## 2016-07-07 NOTE — Progress Notes (Signed)
Daily Session Note  Patient Details  Name: Amy [name] MRN: [id number] Date of Birth: 1927/07/03 Referring Provider:     Pulmonary Rehab from 03/04/2016 in [facility]  Referring Provider  Gollan      Encounter Date: 07/07/2016  Check In:     Session Check In - 07/07/16 1039      Check-In   Location [facility]-Cardiac & Pulmonary Rehab   Staff Present Carson Myrtle, BS, RRT, Respiratory Therapist;Fitzpatrick Alberico Amedeo Plenty, BS, ACSM CEP, Exercise Physiologist;Amanda Oletta Darter, BA, ACSM CEP, Exercise Physiologist   Supervising physician immediately available to respond to emergencies [facility] immediately available ER MD   Physician(s) Alfred Levins and Corky Downs   Medication changes reported     No   Fall or balance concerns reported    No   Warm-up and Cool-down Performed as group-led instruction   Resistance Training Performed Yes   VAD Patient? No     Pain Assessment   Currently in Pain? No/denies   Multiple Pain Sites No         History  Smoking Status  . Never Smoker  Smokeless Tobacco  . Never Used    Goals Met:  Proper associated with RPD/PD & O2 Sat Independence with exercise equipment Exercise tolerated well Personal goals reviewed Strength training completed today  Goals Unmet:  Not Applicable  Comments:  Amy [name] graduated today from pulmonary rehab with 36 sessions completed.  Details of the patient's exercise prescription and what She needs to do in order to continue the prescription and progress were discussed with patient.  Patient was given a copy of prescription and goals.  Patient verbalized understanding.  Amy [name] plans to continue to exercise by coming to our supervised [employer] exercise classes.    Dr. Emily Filbert is Medical Director for [place] and [facility].

## 2016-07-07 NOTE — Progress Notes (Signed)
Pulmonary Individual Treatment Plan  Patient Details  Name: Amy [name] MRN: [id number] Date of Birth: 1928/01/10 Referring Provider:     Pulmonary Rehab from 03/04/2016 in [facility] Cardiac and Pulmonary Rehab  Referring Provider  Gollan      Initial Encounter Date:    Pulmonary Rehab from 03/04/2016 in [facility] Cardiac and Pulmonary Rehab  Date  03/04/16  Referring Provider  Rockey Situ      Visit Diagnosis: Chronic diastolic congestive heart failure ([place])  Patient's Home Medications on Admission:  Current Outpatient Prescriptions:    aspirin 81 MG tablet, Take 81 mg by mouth daily., Disp: , Rfl:    cloNIDine (CATAPRES - DOSED IN MG/24 HR) 0.3 mg/24hr patch, Place 0.3 mg onto the skin once a week. , Disp: , Rfl:    clopidogrel (PLAVIX) 75 MG tablet, Take 1 tablet (75 mg total) by mouth daily., Disp: 90 tablet, Rfl: 3   doxazosin (CARDURA) 8 MG tablet, Take 1 tablet (8 mg total) by mouth 2 (two) times daily., Disp: 180 tablet, Rfl: 3   famotidine (PEPCID) 20 MG tablet, Take 1 tablet (20 mg total) by mouth 2 (two) times daily., Disp: 180 tablet, Rfl: 1   fluticasone (FLONASE) 50 MCG/ACT nasal spray, Place into the nose., Disp: , Rfl:    furosemide (LASIX) 20 MG tablet, Take 1 tablet (20 mg total) by mouth 2 (two) times daily as needed., Disp: 180 tablet, Rfl: 3   furosemide (LASIX) 20 MG tablet, Take 1 tablet (20 mg total) by mouth daily as needed., Disp: 90 tablet, Rfl: 3   glimepiride (AMARYL) 4 MG tablet, Take 4 mg by mouth daily before breakfast., Disp: , Rfl:    ipratropium-albuterol (DUONEB) 0.5-2.5 (3) MG/3ML SOLN, Take 3 mLs by nebulization every 4 (four) hours as needed (shortness of breath/wheezing.)., Disp: 360 mL, Rfl:    levothyroxine (SYNTHROID, LEVOTHROID) 125 MCG tablet, Take 125 mcg by mouth daily., Disp: , Rfl:    losartan-hydrochlorothiazide (HYZAAR) 100-25 MG per tablet, Take 1 tablet by mouth daily.  , Disp: , Rfl:    metFORMIN (GLUCOPHAGE) 1000 MG tablet,  Take 1,000 mg by mouth 2 (two) times daily with a meal., Disp: , Rfl:    Multiple Vitamins-Minerals (PRESERVISION AREDS PO), Take by mouth 2 (two) times daily., Disp: , Rfl:    oxybutynin (DITROPAN) 5 MG tablet, Take 5 mg by mouth 1 dose over 46 hours.  , Disp: , Rfl:    pantoprazole (PROTONIX) 40 MG tablet, Take 40 mg by mouth daily. , Disp: , Rfl:    senna (SENOKOT) 8.6 MG TABS tablet, Take 1 tablet (8.6 mg total) by mouth daily as needed for mild constipation., Disp: 120 each, Rfl: 0   simvastatin (ZOCOR) 40 MG tablet, Take 1 tablet (40 mg total) by mouth at bedtime. (Patient taking differently: Take 40 mg by mouth daily. ), Disp: 90 tablet, Rfl: 3   SYMBICORT 160-4.5 MCG/ACT inhaler, Inhale 2 puffs into the lungs 2 (two) times daily. , Disp: , Rfl:   Past Medical History: Past Medical History:  Diagnosis Date   Bladder incontinence    CAD (coronary artery disease)    a. cardiac cath 05/2009: proximal LAD 90% stenosis s/p PCI/Xience 2.75 x 12 mm DES, mid LAD 40%, diagonal 40%, proximal LCx 40% followed by 40% LCx lesion, 30%-40%-30% lesion noted in the RCA with distal RCA 30% and 25% lesions   Chronic diastolic (congestive) heart failure ([place])    a. Lexiscan 2013: no ischemia, normal EF b.  echo 09/2015: EF 55-60% w/ Grade 1 DD   CKD (chronic kidney disease) stage 3, GFR 30-59 ml/min    Degenerative arthritis of knee    bilateral knees   Diabetes mellitus    Type II   Hiatal hernia    Hypertension    Iron deficiency    Menopausal symptoms    Morbid obesity (HCC)    Thyroid disease    hypothyroidism    Tobacco Use: History  Smoking Status   Never Smoker  Smokeless Tobacco   Never Used    Labs: Recent Review Flowsheet Data    Labs for ITP Cardiac and Pulmonary Rehab Latest Ref Rng & Units 05/07/2009 10/18/2009 06/25/2010 10/15/2012 10/01/2015   Cholestrol 0 - 200 mg/dL 148 147 135 - -   LDLCALC 0 - 99 mg/dL 80 80 56 - -   HDL >39 mg/dL 29.2 40 35(L) - -    Trlycerides <150 mg/dL 194 133 220(H) - -   Hemoglobin A1c 4.0 - 6.0 % 6.3 - - 8.6(H) 5.6       ADL UCSD:     Pulmonary Assessment Scores    Row Name 03/04/16 1208 04/30/16 1000 06/27/16 1243     ADL UCSD   ADL Phase Entry Mid Exit   SOB Score total 76 78 88   Rest 0 2 2   Walk 0 4 4   Stairs '5 5 5   '$ Bath '2 3 4   '$ Dress '3 3 4   '$ Shop '5 4 5      '$ Pulmonary Function Assessment:     Pulmonary Function Assessment - 03/04/16 1207      Initial Spirometry Results   FVC% 107 %   FEV1% 130 %   FEV1/FVC Ratio 89.63     Breath   Bilateral Breath Sounds Clear   Shortness of Breath Yes;Limiting activity;Fear of Shortness of Breath      Exercise Target Goals:    Exercise Program Goal: Individual exercise prescription set with THRR, safety & activity barriers. Participant demonstrates ability to understand and report RPE using BORG scale, to self-measure pulse accurately, and to acknowledge the importance of the exercise prescription.  Exercise Prescription Goal: Starting with aerobic activity 30 plus minutes a day, 3 days per week for initial exercise prescription. Provide home exercise prescription and guidelines that participant acknowledges understanding prior to discharge.  Activity Barriers & Risk Stratification:     Activity Barriers & Cardiac Risk Stratification - 03/04/16 1206      Activity Barriers & Cardiac Risk Stratification   Activity Barriers Shortness of Breath;Deconditioning;Muscular Weakness   Cardiac Risk Stratification Moderate      6 Minute Walk:     6 Minute Walk    Row Name 03/04/16 1223 04/28/16 1136 06/30/16 1142     6 Minute Walk   Phase  -- Mid Program Discharge   Distance 90 feet 230 feet 235 feet   Distance % Change  --  -- 161 %   Walk Time 1 minutes 2.5 minutes 3 minutes   # of Rest Breaks '2 3 3   '$ MPH 0.9 1 0.89   METS 1.7 1.8 1.7   RPE '17 15 15   '$ Perceived Dyspnea  '4 3 3   '$ VO2 Peak 5.9 6.2 5.9   Symptoms Yes (comment) No Yes  (comment)   Comments short of breath  -- stopped to rest due to shortness of breath   Resting HR 89 bpm 84 bpm 85 bpm  Resting BP 122/52 152/84 130/70   Max Ex. HR 112 bpm 110 bpm 85 bpm   Max Ex. BP 152/42 152/64 138/60     Interval HR   Baseline HR  -- 84 85   1 Minute HR  -- 104 100   2 Minute HR  -- 92  resting  --   3 Minute HR  -- 110 104   4 Minute HR  -- 90  resting  --   5 Minute HR  -- 100  --   6 Minute HR  -- 99 100   2 Minute Post HR  -- 94  --   Interval Heart Rate?  -- Yes Yes     Interval Oxygen   Interval Oxygen?  -- Yes Yes   Baseline Oxygen Saturation %  -- 97 % 98 %   Baseline Liters of Oxygen  -- 0 L 0 L   1 Minute Oxygen Saturation %  -- 97 % 100 %   1 Minute Liters of Oxygen  -- 0 L 0 L   2 Minute Oxygen Saturation %  -- 100 %  --   2 Minute Liters of Oxygen  -- 0 L  --   3 Minute Oxygen Saturation %  -- 96 % 99 %   3 Minute Liters of Oxygen  -- 0 L 104 L   4 Minute Oxygen Saturation %  -- 99 %  --   4 Minute Liters of Oxygen  -- 0 L  --   5 Minute Oxygen Saturation %  -- 99 %  --   5 Minute Liters of Oxygen  -- 0 L  --   6 Minute Oxygen Saturation %  -- 93 % 99 %   6 Minute Liters of Oxygen  -- 0 L 0 L   2 Minute Post Oxygen Saturation %  -- 99 %  --   2 Minute Post Liters of Oxygen  -- 0 L  --     Oxygen Initial Assessment:   Oxygen Re-Evaluation:   Oxygen Discharge (Final Oxygen Re-Evaluation):   Initial Exercise Prescription:     Initial Exercise Prescription - 03/04/16 1200      Date of Initial Exercise RX and Referring Provider   Date 03/04/16   Referring Provider Gollan     Treadmill   MPH 0.5   Grade 0   Minutes 15  1/1/1/1     NuStep   Level 1   Minutes 15   METs 1.5     T5 Nustep   Level 1   Minutes 15   METs 1.5     Biostep-RELP   Level 1   Minutes 15   METs 1.5     Prescription Details   Frequency (times per week) 3   Duration Progress to 30 minutes of continuous aerobic without signs/symptoms of  physical distress     Intensity   THRR 40-80% of Max Heartrate 107-125   Ratings of Perceived Exertion 11-13   Perceived Dyspnea 0-4     Progression   Progression Continue to progress workloads to maintain intensity without signs/symptoms of physical distress.     Resistance Training   Training Prescription Yes   Weight 1   Reps 10-15      Perform Capillary Blood Glucose checks as needed.  Exercise Prescription Changes:     Exercise Prescription Changes    Row Name 03/10/16 1100 03/28/16 0900 04/10/16 1100 04/16/16 1200 04/24/16 1100  Response to Exercise   Blood Pressure (Admit) 164/60 124/60 138/56  -- 148/60   Blood Pressure (Exercise) 168/58 126/64 128/64  -- 106/74   Blood Pressure (Exit) 148/62 134/80 104/58  -- 120/60   Heart Rate (Admit) 103 bpm 95 bpm 97 bpm  -- 85 bpm   Heart Rate (Exercise) 93 bpm 89 bpm 93 bpm  -- 92 bpm   Heart Rate (Exit) 88 bpm 91 bpm 89 bpm  -- 97 bpm   Oxygen Saturation (Admit) 98 % 97 % 98 %  -- 99 %   Oxygen Saturation (Exercise) 98 % 95 % 95 %  -- 97 %   Oxygen Saturation (Exit) 97 % 96 % 98 %  -- 98 %   Rating of Perceived Exertion (Exercise) '13 15 13  '$ -- 15   Perceived Dyspnea (Exercise) '4 3 4  '$ -- 3   Duration  -- Progress to 45 minutes of aerobic exercise without signs/symptoms of physical distress Progress to 45 minutes of aerobic exercise without signs/symptoms of physical distress  -- Progress to 45 minutes of aerobic exercise without signs/symptoms of physical distress   Intensity  -- THRR unchanged THRR unchanged  -- THRR unchanged     Progression   Progression  -- Continue to progress workloads to maintain intensity without signs/symptoms of physical distress. Continue to progress workloads to maintain intensity without signs/symptoms of physical distress.  -- Continue to progress workloads to maintain intensity without signs/symptoms of physical distress.     Resistance Training   Training Prescription Yes Yes Yes  -- Yes    Weight '1 1 2  '$ -- 2   Reps 10-15 10-15 10-12  -- 10-15     Interval Training   Interval Training  --  -- No  -- No     NuStep   Level '1 3 3  '$ -- 3   Minutes '15 15 15  '$ -- 15   METs 1.5 1.2  --  -- 1.6     T5 Nustep   Level 1  -- 1  -- 1   Minutes '15 15 15  '$ -- 15   METs 1.5 1.7 1.8  -- 1.6     Home Exercise Plan   Plans to continue exercise at  --  --  -- [employer]  --   Frequency  --  --  -- Add 1 additional day to program exercise sessions.  --   Row Name 05/09/16 1200 05/21/16 1200 06/05/16 1000 06/18/16 1400 07/02/16 1200     Response to Exercise   Blood Pressure (Admit) 138/60 146/64 138/70 132/60 160/60   Blood Pressure (Exercise) 162/68 160/64 126/56 142/80 148/60   Blood Pressure (Exit) 128/54 130/62 134/66 142/50 150/56   Heart Rate (Admit) 62 bpm 90 bpm 71 bpm 83 bpm 84 bpm   Heart Rate (Exercise) 84 bpm 95 bpm 75 bpm 82 bpm 101 bpm   Heart Rate (Exit) 89 bpm 96 bpm 58 bpm 76 bpm 98 bpm   Oxygen Saturation (Admit) 100 % 100 % 98 % 96 % 96 %   Oxygen Saturation (Exercise) 97 % 97 % 95 % 96 % 90 %   Oxygen Saturation (Exit) 98 % 99 % 94 % 97 % 98 %   Rating of Perceived Exertion (Exercise) '15 15 15 15 15   '$ Perceived Dyspnea (Exercise) '5 3 3 3 3   '$ Duration Progress to 45 minutes of aerobic exercise without signs/symptoms of physical distress Progress to 45 minutes of  aerobic exercise without signs/symptoms of physical distress Progress to 45 minutes of aerobic exercise without signs/symptoms of physical distress Progress to 45 minutes of aerobic exercise without signs/symptoms of physical distress Progress to 45 minutes of aerobic exercise without signs/symptoms of physical distress   Intensity THRR unchanged THRR unchanged THRR unchanged THRR unchanged THRR unchanged     Progression   Progression Continue to progress workloads to maintain intensity without signs/symptoms of physical distress. Continue to progress workloads to maintain intensity without signs/symptoms of  physical distress. Continue to progress workloads to maintain intensity without signs/symptoms of physical distress. Continue to progress workloads to maintain intensity without signs/symptoms of physical distress. Continue to progress workloads to maintain intensity without signs/symptoms of physical distress.     Resistance Training   Training Prescription Yes Yes Yes Yes Yes   Weight '2 2 2 2 2   '$ Reps 10-15 10-15 10-15 10-15 10-15     Interval Training   Interval Training  --  -- No No  --     NuStep   Level '5 5 5 5 5   '$ Minutes '15 15 15 15 15   '$ METs 1.6 1.6 1.5 1.5 1.4     T5 Nustep   Level 5 1  -- 1 2   Minutes 15 15  -- 15 15   METs 1.8  --  -- 1.8 1.7     Track   Laps  -- 6 7.'5 7 7      '$ Exercise Comments:     Exercise Comments    Row Name 03/10/16 1149 03/28/16 7035 04/02/16 1128 04/10/16 1202 04/16/16 1258   Exercise Comments First full day of exercise!  Patient was oriented to gym and equipment including functions, settings, policies, and procedures.  Patient's individual exercise prescription and treatment plan were reviewed.  All starting workloads were established based on the results of the 6 minute walk test done at initial orientation visit.  The plan for exercise progression was also introduced and progression will be customized based on patient's performance and goals Bliss has been able to walk in one minute intervals on the TM. [place] moved up to 1.3 mph and did 2 min intervals today!! Jaeanna continues to add time to her treadmill walking.  She has missed a couple days due to an eye injection but returned 2/14. Maxcine does some walking at home currently and is considering [employer] when she graduates LW.  She still is short of breath; we discussed the importance of exercise and walking for ADLs as well as practicing breathing techniques.  Staff also encouraged her to stay for education to learn to better manage her CHF.   [place] Name 04/24/16 1121 04/28/16 1147 05/09/16  1204 05/21/16 1254 06/05/16 1011   Exercise Comments Kezia continues to work on adding time to her TM walking. Mid 6 min walk test was done with [place] today. Results and progresss were discussed with her.  Breda is showing steady progress in her functional capacity. Lynzee is doing well walking with a walker insead of the TM. Staff members encourage Marylu to continue to walk more laps.   Row Name 06/09/16 1159 06/18/16 1425 06/30/16 1232 07/02/16 1248 07/07/16 1229   Exercise Comments Lashuna walked 7 laps around the gym today which is an increase from her usual 6 laps. She has a goal of being able to walk more in order to be able to go to the grocery store.  Aretha has not attended since 06/09/16. 6  min walk assessment done with patient today. Results and progress were reviewed with patient.  Henritta is considering CARE after she graduates [facility].  Julianah graduated today from pulmonary rehab with 36 sessions completed.  Details of the patient's exercise prescription and what She needs to do in order to continue the prescription and progress were discussed with patient.  Patient was given a copy of prescription and goals.  Patient verbalized understanding.  Alyzabeth plans to continue to exercise by coming to our supervised [employer] exercise classes.      Exercise Goals and Review:   Exercise Goals Re-Evaluation :   Discharge Exercise Prescription (Final Exercise Prescription Changes):     Exercise Prescription Changes - 07/02/16 1200      Response to Exercise   Blood Pressure (Admit) 160/60   Blood Pressure (Exercise) 148/60   Blood Pressure (Exit) 150/56   Heart Rate (Admit) 84 bpm   Heart Rate (Exercise) 101 bpm   Heart Rate (Exit) 98 bpm   Oxygen Saturation (Admit) 96 %   Oxygen Saturation (Exercise) 90 %   Oxygen Saturation (Exit) 98 %   Rating of Perceived Exertion (Exercise) 15   Perceived Dyspnea (Exercise) 3   Duration Progress to 45 minutes of aerobic exercise without signs/symptoms  of physical distress   Intensity THRR unchanged     Progression   Progression Continue to progress workloads to maintain intensity without signs/symptoms of physical distress.     Resistance Training   Training Prescription Yes   Weight 2   Reps 10-15     NuStep   Level 5   Minutes 15   METs 1.4     T5 Nustep   Level 2   Minutes 15   METs 1.7     Track   Laps 7      Nutrition:  Target Goals: Understanding of nutrition guidelines, daily intake of sodium '1500mg'$ , cholesterol '200mg'$ , calories 30% from fat and 7% or less from saturated fats, daily to have 5 or more servings of fruits and vegetables.  Biometrics:     Pre Biometrics - 03/10/16 1143      Pre Biometrics   Waist Circumference 45 inches   Hip Circumference 56.5 inches   Waist to Hip Ratio 0.8 %         Post Biometrics - 06/30/16 1232       Post  Biometrics   Height 5' 0.5" (1.537 m)   Weight 218 lb (98.9 kg)   Waist Circumference 45.5 inches   Hip Circumference 55 inches   Waist to Hip Ratio 0.83 %   BMI (Calculated) 42      Nutrition Therapy Plan and Nutrition Goals:   Nutrition Discharge: Rate Your Plate Scores:   Nutrition Goals Re-Evaluation:   Nutrition Goals Discharge (Final Nutrition Goals Re-Evaluation):   Psychosocial: Target Goals: Acknowledge presence or absence of significant depression and/or stress, maximize coping skills, provide positive support system. Participant is able to verbalize types and ability to use techniques and skills needed for reducing stress and depression.   Initial Review & Psychosocial Screening:     Initial Psych Review & Screening - 03/04/16 [place]? Yes   Comments Ms Crabtree has good support from her daughter and son, although the son who lives in [place] helps the most with errands and household chores. She has sadness over the death of her one daughter who died of a stroke. Ms Rogness  is looking  forward to walking more and being nore active.     Barriers   Psychosocial barriers to participate in program The patient should benefit from training in stress management and relaxation.     Screening Interventions   Interventions Encouraged to exercise;Program counselor consult      Quality of Life Scores:     Quality of Life - 06/27/16 1242      Quality of Life Scores   Health/Function Post 10.91 %   Socioeconomic Post 22.5 %   Psych/Spiritual Post 30.4 %   Family Post 26 %   GLOBAL Post 16.96 %      PHQ-9: Recent Review Flowsheet Data    Depression screen [facility] 2/9 06/27/2016 03/04/2016   Decreased Interest 1 3   Down, Depressed, Hopeless 1 2   PHQ - 2 Score 2 5   Altered sleeping 1 2   Tired, decreased energy 1 3   Change in appetite 1 2   Feeling bad or failure about yourself  0 1   Trouble concentrating 0 1   Moving slowly or fidgety/restless 0 0   Suicidal thoughts 0 0   PHQ-9 Score 5 14   Difficult doing work/chores Somewhat difficult Somewhat difficult     Interpretation of Total Score  Total Score Depression Severity:  1-4 = Minimal depression, 5-9 = Mild depression, 10-14 = Moderate depression, 15-19 = Moderately severe depression, 20-27 = Severe depression   Psychosocial Evaluation and Intervention:     Psychosocial Evaluation - 07/07/16 0717      Discharge Psychosocial Assessment & Intervention   Comments Ms Cortopassi graduates today. She has improved with her walking ability which inturn has given her more mobility to live by herself. Living by herself is so important. She lives in a cottage beside her daughters house and wants to continue living there. Even though her daughter has died, her granddaughter has now moved in and will be available to help Ms Conradt as she needs. Ms Korte has enjoyed [facility] both physicially and emotionally and plans to continue her exercise in [employer]. She is commended for her regular attendance to [place].        Psychosocial Re-Evaluation:     Psychosocial Re-Evaluation    [place] Name 04/23/16 1456 05/21/16 1504 06/02/16 1100         Psychosocial Re-Evaluation   Comments Ms Friedt states today how much she is enjoying [facility] and has made improvements in her activity levels at home. It is very noticeable her improved walking ability in the gym .  She is more involved in the class as for as socializing with the other participants. I have encouraged her to stay for education. Today Ms Mcmanus stated her interest in the class of [employer].  -- Counselor follow up today with Jennice reporting some improvement with feeling stronger and able to walk a little further without having to stop to catch her breath as often.  She continues to struggle with sleep but reports she is getting at least 5 hours most nights and occasionally will take a nap.  Ruble expressed some concern with getting to/from her eye appointment since there is no valet parking there.  Counselor encouraged her to check with her church congregation and staff will provide her with the number for public transportation for her to contact for this medical appointment.  Cristan reports she enjoys this program and it helps her get out of the house and become more independent.  She  continues to grieve the loss of her daughter -and that is expected since it is so recent.  Counselor and staff will continue to provide supportive services for [place].       Expected Outcomes  -- Ms Magana is progressing with her exercise goals and has noticed improvements with walking a home. She now can park her car on the driveway, instead of the grass by the door. This all is very positive for her, for she wants to live independently. Ms Melendrez enjoys the socialization with the other participants and attends regularly.  Betha will continue to exercise consistently and will reach out for the medical transportation needs she has for assistance.  Staff will  continue to provide support in these areas.         Psychosocial Discharge (Final Psychosocial Re-Evaluation):     Psychosocial Re-Evaluation - 06/02/16 1100      Psychosocial Re-Evaluation   Comments Counselor follow up today with Yannet reporting some improvement with feeling stronger and able to walk a little further without having to stop to catch her breath as often.  She continues to struggle with sleep but reports she is getting at least 5 hours most nights and occasionally will take a nap.  Jimmie expressed some concern with getting to/from her eye appointment since there is no valet parking there.  Counselor encouraged her to check with her church congregation and staff will provide her with the number for public transportation for her to contact for this medical appointment.  Saja reports she enjoys this program and it helps her get out of the house and become more independent.  She continues to grieve the loss of her daughter -and that is expected since it is so recent.  Counselor and staff will continue to provide supportive services for [place].     Expected Outcomes Danali will continue to exercise consistently and will reach out for the medical transportation needs she has for assistance.  Staff will continue to provide support in these areas.       Education: Education Goals: Education classes will be provided on a weekly basis, covering required topics. Participant will state understanding/return demonstration of topics presented.  Learning Barriers/Preferences:     Learning Barriers/Preferences - 03/04/16 1206      Learning Barriers/Preferences   Learning Barriers None   Learning Preferences None      Education Topics: Initial Evaluation Education: - Verbal, written and demonstration of respiratory meds, RPE/PD scales, oximetry and breathing techniques. Instruction on use of nebulizers and MDIs: cleaning and proper use, rinsing mouth with steroid doses and importance of  monitoring MDI activations.   Pulmonary Rehab from 06/25/2016 in [facility] Cardiac and Pulmonary Rehab  Date  03/04/16  Educator  LB  Instruction Review Code  2- meets goals/outcomes      General Nutrition Guidelines/Fats and Fiber: -Group instruction provided by verbal, written material, models and posters to present the general guidelines for heart healthy nutrition. Gives an explanation and review of dietary fats and fiber.   Pulmonary Rehab from 06/25/2016 in [facility] Cardiac and Pulmonary Rehab  Date  06/23/16  Educator  CR  Instruction Review Code  2- meets goals/outcomes      Controlling Sodium/Reading Food Labels: -Group verbal and written material supporting the discussion of sodium use in heart healthy nutrition. Review and explanation with models, verbal and written materials for utilization of the food label.   Exercise Physiology & Risk Factors: - Group verbal and written instruction with models to  review the exercise physiology of the cardiovascular system and associated critical values. Details cardiovascular disease risk factors and the goals associated with each risk factor.   Aerobic Exercise & Resistance Training: - Gives group verbal and written discussion on the health impact of inactivity. On the components of aerobic and resistive training programs and the benefits of this training and how to safely progress through these programs.   Pulmonary Rehab from 06/25/2016 in [facility] Cardiac and Pulmonary Rehab  Date  03/26/16  Educator  [facility]  Instruction Review Code  2- meets goals/outcomes      Flexibility, Balance, General Exercise Guidelines: - Provides group verbal and written instruction on the benefits of flexibility and balance training programs. Provides general exercise guidelines with specific guidelines to those with heart or lung disease. Demonstration and skill practice provided.   Stress Management: - Provides group verbal and written instruction about the health  risks of elevated stress, cause of high stress, and healthy ways to reduce stress.   Depression: - Provides group verbal and written instruction on the correlation between heart/lung disease and depressed mood, treatment options, and the stigmas associated with seeking treatment.   Exercise & Equipment Safety: - Individual verbal instruction and demonstration of equipment use and safety with use of the equipment.   Pulmonary Rehab from 06/25/2016 in [facility] Cardiac and Pulmonary Rehab  Date  05/30/16  Educator  C. Barbee Shropshire and I had a long discussion about her being able to ex]  Instruction Review Code  2- meets goals/outcomes      Infection Prevention: - Provides verbal and written material to individual with discussion of infection control including proper hand washing and proper equipment cleaning during exercise session.   Pulmonary Rehab from 06/25/2016 in [facility] Cardiac and Pulmonary Rehab  Date  03/10/16  Educator  AS  Instruction Review Code  2- meets goals/outcomes      Falls Prevention: - Provides verbal and written material to individual with discussion of falls prevention and safety.   Pulmonary Rehab from 06/25/2016 in [facility] Cardiac and Pulmonary Rehab  Date  03/04/16  Educator  LB  Instruction Review Code  2- meets goals/outcomes      Diabetes: - Individual verbal and written instruction to review signs/symptoms of diabetes, desired ranges of glucose level fasting, after meals and with exercise. Advice that pre and post exercise glucose checks will be done for 3 sessions at entry of program.   Chronic Lung Diseases: - Group verbal and written instruction to review new updates, new respiratory medications, new advancements in procedures and treatments. Provide informative websites and "800" numbers of self-education.   Pulmonary Rehab from 06/25/2016 in [facility] Cardiac and Pulmonary Rehab  Date  06/04/16  Educator  LB  Instruction Review Code  2- meets goals/outcomes       Lung Procedures: - Group verbal and written instruction to describe testing methods done to diagnose lung disease. Review the outcome of test results. Describe the treatment choices: Pulmonary Function Tests, ABGs and oximetry.   Energy Conservation: - Provide group verbal and written instruction for methods to conserve energy, plan and organize activities. Instruct on pacing techniques, use of adaptive equipment and posture/positioning to relieve shortness of breath.   Pulmonary Rehab from 06/25/2016 in [facility] Cardiac and Pulmonary Rehab  Date  04/23/16  Educator  [facility]  Instruction Review Code  2- meets goals/outcomes      Triggers: - Group verbal and written instruction to review types of environmental controls: home humidity, furnaces,  filters, dust mite/pet prevention, HEPA vacuums. To discuss weather changes, air quality and the benefits of nasal washing.   Exacerbations: - Group verbal and written instruction to provide: warning signs, infection symptoms, calling MD promptly, preventive modes, and value of vaccinations. Review: effective airway clearance, coughing and/or vibration techniques. Create an Sports administrator.   Pulmonary Rehab from 06/25/2016 in [facility] Cardiac and Pulmonary Rehab  Date  04/09/16  Educator  LB  Instruction Review Code  2- meets goals/outcomes      Oxygen: - Individual and group verbal and written instruction on oxygen therapy. Includes supplement oxygen, available portable oxygen systems, continuous and intermittent flow rates, oxygen safety, concentrators, and Medicare reimbursement for oxygen.   Respiratory Medications: - Group verbal and written instruction to review medications for lung disease. Drug class, frequency, complications, importance of spacers, rinsing mouth after steroid MDI's, and proper cleaning methods for nebulizers.   AED/CPR: - Group verbal and written instruction with the use of models to demonstrate the basic use of the AED with  the basic ABC's of resuscitation.   Pulmonary Rehab from 06/25/2016 in [facility] Cardiac and Pulmonary Rehab  Date  06/06/16 [facility] teaching with Tirzah about AED from 10:05 am to 10:20am. ]  Educator  C. EnterkinRN  Instruction Review Code  2- meets goals/outcomes      Breathing Retraining: - Provides individuals verbal and written instruction on purpose, frequency, and proper technique of diaphragmatic breathing and pursed-lipped breathing. Applies individual practice skills.   Pulmonary Rehab from 06/25/2016 in [facility] Cardiac and Pulmonary Rehab  Date  03/10/16  Educator  AS  Instruction Review Code  2- meets goals/outcomes      Anatomy and Physiology of the Lungs: - Group verbal and written instruction with the use of models to provide basic lung anatomy and physiology related to function, structure and complications of lung disease.   Heart Failure: - Group verbal and written instruction on the basics of heart failure: signs/symptoms, treatments, explanation of ejection fraction, enlarged heart and cardiomyopathy.   Sleep Apnea: - Individual verbal and written instruction to review Obstructive Sleep Apnea. Review of risk factors, methods for diagnosing and types of masks and machines for OSA.   Anxiety: - Provides group, verbal and written instruction on the correlation between heart/lung disease and anxiety, treatment options, and management of anxiety.   Relaxation: - Provides group, verbal and written instruction about the benefits of relaxation for patients with heart/lung disease. Also provides patients with examples of relaxation techniques.   Pulmonary Rehab from 06/25/2016 in [facility] Cardiac and Pulmonary Rehab  Date  06/25/16  Educator  [facility]  Instruction Review Code  2- Meets goals/outcomes      Knowledge Questionnaire Score:     Knowledge Questionnaire Score - 06/25/16 1053      Knowledge Questionnaire Score   Post Score 10/10       Core Components/Risk Factors/Patient  Goals at Admission:     Personal Goals and Risk Factors at Admission - 03/04/16 1212      Core Components/Risk Factors/Patient Goals on Admission   Sedentary Yes  Goal: improve walking   Intervention Provide advice, education, support and counseling about physical activity/exercise needs.;Develop an individualized exercise prescription for aerobic and resistive training based on initial evaluation findings, risk stratification, comorbidities and participant's personal goals.   Expected Outcomes Achievement of increased cardiorespiratory fitness and enhanced flexibility, muscular endurance and strength shown through measurements of functional capacity and personal statement of participant.   Increase Strength and Stamina Yes  Intervention Provide advice, education, support and counseling about physical activity/exercise needs.;Develop an individualized exercise prescription for aerobic and resistive training based on initial evaluation findings, risk stratification, comorbidities and participant's personal goals.   Expected Outcomes Achievement of increased cardiorespiratory fitness and enhanced flexibility, muscular endurance and strength shown through measurements of functional capacity and personal statement of participant.   Improve shortness of breath with ADL's Yes   Intervention Provide education, individualized exercise plan and daily activity instruction to help decrease symptoms of SOB with activities of daily living.   Expected Outcomes Short Term: Achieves a reduction of symptoms when performing activities of daily living.   Develop more efficient breathing techniques such as purse lipped breathing and diaphragmatic breathing; and practicing self-pacing with activity Yes   Intervention Provide education, demonstration and support about specific breathing techniuqes utilized for more efficient breathing. Include techniques such as pursed lipped breathing, diaphragmatic breathing and  self-pacing activity.   Expected Outcomes Short Term: Participant will be able to demonstrate and use breathing techniques as needed throughout daily activities.   Increase knowledge of respiratory medications and ability to use respiratory devices properly  Yes   Intervention Provide education and demonstration as needed of appropriate use of medications, inhalers, and oxygen therapy.  Duoneb SVN, Symbicort, Flutter Valve, Incentive Spirometer   Expected Outcomes Short Term: Achieves understanding of medications use. Understands that oxygen is a medication prescribed by physician. Demonstrates appropriate use of inhaler and oxygen therapy.   Diabetes Yes   Intervention Provide education about signs/symptoms and action to take for hypo/hyperglycemia.;Provide education about proper nutrition, including hydration, and aerobic/resistive exercise prescription along with prescribed medications to achieve blood glucose in normal ranges: Fasting glucose 65-99 mg/dL   Expected Outcomes Short Term: Participant verbalizes understanding of the signs/symptoms and immediate care of hyper/hypoglycemia, proper foot care and importance of medication, aerobic/resistive exercise and nutrition plan for blood glucose control.;Long Term: Attainment of HbA1C < 7%.   Heart Failure Yes   Intervention Provide a combined exercise and nutrition program that is supplemented with education, support and counseling about heart failure. Directed toward relieving symptoms such as shortness of breath, decreased exercise tolerance, and extremity edema.   Expected Outcomes Improve functional capacity of life;Short term: Attendance in program 2-3 days a week with increased exercise capacity. Reported lower sodium intake. Reported increased fruit and vegetable intake. Reports medication compliance.;Short term: Daily weights obtained and reported for increase. Utilizing diuretic protocols set by physician.;Long term: Adoption of self-care  skills and reduction of barriers for early signs and symptoms recognition and intervention leading to self-care maintenance.   Hypertension Yes   Intervention Provide education on lifestyle modifcations including regular physical activity/exercise, weight management, moderate sodium restriction and increased consumption of fresh fruit, vegetables, and low fat dairy, alcohol moderation, and smoking cessation.;Monitor prescription use compliance.   Expected Outcomes Short Term: Continued assessment and intervention until BP is < 140/41m HG in hypertensive participants. < 130/875mHG in hypertensive participants with diabetes, heart failure or chronic kidney disease.;Long Term: Maintenance of blood pressure at goal levels.   Lipids Yes   Intervention Provide education and support for participant on nutrition & aerobic/resistive exercise along with prescribed medications to achieve LDL '70mg'$ , HDL >'40mg'$ .   Expected Outcomes Short Term: Participant states understanding of desired cholesterol values and is compliant with medications prescribed. Participant is following exercise prescription and nutrition guidelines.;Long Term: Cholesterol controlled with medications as prescribed, with individualized exercise RX and with personalized nutrition plan. Value goals: LDL < '70mg'$ , HDL >  40 mg.      Core Components/Risk Factors/Patient Goals Review:      Goals and Risk Factor Review    Row Name 03/10/16 1143 03/21/16 1147 04/14/16 1146 05/21/16 1251 05/26/16 1621     Core Components/Risk Factors/Patient Goals Review   Personal Goals Review Develop more efficient breathing techniques such as purse lipped breathing and diaphragmatic breathing and practicing self-pacing with activity. Weight Management/Obesity;Improve shortness of breath with ADL's;Heart Failure Sedentary;Improve shortness of breath with ADL's;Increase Strength and Stamina;Develop more efficient breathing techniques such as purse lipped breathing  and diaphragmatic breathing and practicing self-pacing with activity. Improve shortness of breath with ADL's;Heart Failure Improve shortness of breath with ADL's;Develop more efficient breathing techniques such as purse lipped breathing and diaphragmatic breathing and practicing self-pacing with activity.   Review Pursed lip breathing was reviewed with the patient. She demonstrated understanding of this breathing technique.  Chaunda has not been monitoring her weight as she does not have a scale at home.  Daily and weekly numbers were reviewed as well as signs of CHF.  She has felt tired since starting LW but feels practicing PLB helps her breathe better. Avah states she can get around a little better since beginning exercise.  Her weight has been stable and even down a few pounds since beginning [employer].  She still feels walking the TM is hard breathing wise, so we reviewed PLB and she was able to walk 1:30 on the TM 2 times. Ryli was able to get scales throught the [facility] to keep at home.  She couldnt get them to work but has family to help.  She knows she has fluid retention problems but was not sure what limits her Dr wants her to use when weighing herself.  She can walk from her house to her car without SOB.  She still stops to rest with ADLs but can "do more than when she started." Ms Engelbert complained of shortness of breath today -  Qued her to perform PLB which does help her. She now parks her car in the driveway, instead of on the grass by her door. Ms Flanary is becoming more independent in class with equipment set-up and recording her O2Sat' and May.   Expected Outcomes Patient will use PLB to with exercise and ADL's to help control SOB and improve O2 Sats.  Staff will check on getting her a scale throught the [facility] to monitor weight.  Regular participation in [place]  will help Tania increase oveall stamina and better manage her CHF. Shadavia will continue to improve her stamina. Blyss  will better manage her fluid retention adn CHF with the scales and continue to improve SOB with ADLs. Continue exercising to increase her stamina and use PLB with activity   Row Name 06/25/16 1507             Core Components/Risk Factors/Patient Goals Review   Personal Goals Review Weight Management/Obesity;Improve shortness of breath with ADL's;Increase knowledge of respiratory medications and ability to use respiratory devices properly.;Develop more efficient breathing techniques such as purse lipped breathing and diaphragmatic breathing and practicing self-pacing with activity.;Hypertension;Heart Failure;Lipids       Review Ms Shell is close to graduation. She states her strength has improved and is walking longer distances at home. Ms Schraeder is looking forward to continuing her exercise and plans to join [employer]. Her shortness of breath is a struggle, but she has learned to pace herself and use PLB. For weight control, she is  eating smaller meals more frequently throughout the day and does like fruit. Her blood pressure has been acceptable in [facility], and she is compliant with her medications. She has a good understanding of her Symbicort and Duoneb. Ms Vanbergen is commended for her regular attendence and hard work in [employer].       Expected Outcomes Continue exercise after [facility] in [employer] and continue self-management of her CHF through the education she has learned in the program.          Core Components/Risk Factors/Patient Goals at Discharge (Final Review):      Goals and Risk Factor Review - 06/25/16 1507      Core Components/Risk Factors/Patient Goals Review   Personal Goals Review Weight Management/Obesity;Improve shortness of breath with ADL's;Increase knowledge of respiratory medications and ability to use respiratory devices properly.;Develop more efficient breathing techniques such as purse lipped breathing and diaphragmatic breathing and practicing  self-pacing with activity.;Hypertension;Heart Failure;Lipids   Review Ms Jolley is close to graduation. She states her strength has improved and is walking longer distances at home. Ms Halfhill is looking forward to continuing her exercise and plans to join [employer]. Her shortness of breath is a struggle, but she has learned to pace herself and use PLB. For weight control, she is eating smaller meals more frequently throughout the day and does like fruit. Her blood pressure has been acceptable in [facility], and she is compliant with her medications. She has a good understanding of her Symbicort and Duoneb. Ms Southgate is commended for her regular attendence and hard work in [employer].   Expected Outcomes Continue exercise after [facility] in [employer] and continue self-management of her CHF through the education she has learned in the program.      ITP Comments:     ITP Comments    Row Name 05/30/16 1121 06/30/16 0837         ITP Comments I discussed today with Kahlia the option to exercise in the Care Class at 12:15pm on Tuesday and Thursdays. Her doctor who she sees in the [place] for her anemia is going to write the order to clear her to exercise in that class. 11:10-11:21 30 day note review with Dr Emily Filbert, Medical Director of [place]         Comments:  Josclyn graduated today from cardiac rehab with 36 sessions completed.  Details of the patient's exercise prescription and what She needs to do in order to continue the prescription and progress were discussed with patient.  Patient was given a copy of prescription and goals.  Patient verbalized understanding.  Sherlon plans to continue to exercise by joining [employer] 2 days/week.Marland Kitchen

## 2016-08-08 DIAGNOSIS — H353222 Exudative age-related macular degeneration, left eye, with inactive choroidal neovascularization: Secondary | ICD-10-CM | POA: Diagnosis not present

## 2016-08-13 ENCOUNTER — Ambulatory Visit: Payer: Medicare Other | Admitting: Podiatry

## 2016-08-13 NOTE — Progress Notes (Signed)
* [facility]     Assessment and Plan:  Dyspnea. -The patient's dyspnea is likely multifactorial from cardiomegaly with chronic diastolic congestive heart failure, bronchitis, atelectasis, morbid obesity, deconditioning. --Discussed again today that her breathing issues are chronic, there is no particular "fix" for this. Our focus will be to try to keep her function where it is at, and try to allow her to live independently for as long as possible.  -At previous visits. He was noted that she would benefit from rehabilitation, she completed physical therapy, she was then sent for pulmonary rehabilitation, she has continued with this and feels that it is helping.   Atelectasis.  -CT of the chest shows atelectasis of the medial segment of the right middle lobe. This is most likely due to external compression, however an endobronchial tumor cannot be ruled out based on the scan. -She was encouraged to use her incentive spirometry and flutter valve. Continue PT.  -Given the presence of lung nodules, atelectasis, paratracheal lymphadenopathy, we could repeat CT however, given advanced age, multiple comorbidites and presence of plavix, she would be at high risk of complications. Also, it is unlikely that any intervention are likely to prolong her life, therefore will defer bronchoscopy/repeat CT scanning at this time.   Asthma.  -Continue symbicort bid.  --s/p Prevnar-13 vaccine.  Hiatal hernia/intrathoracic stomach. -Patient has a significant hiatal hernia, which is likely contributing to dyspnea and atelectasis. -The patient is likely at high risk of aspiration, continue to treat with antireflux measures. -Patient is no longer on protonix, will start famotidine.   CHF with cardiomegaly. -Patient has significant cardiomegaly noted on CT of the chest, this likely attributing to atelectasis.  Obesity. -BMI = 46 -Likely contributing to dyspnea, weight loss would be  beneficial for her dyspnea. -Obesity is likely contributing to atelectasis due to external compression from chest wall and diaphragm.   Advanced Care Planning:  She reaffirms today that she would not want to undergo chemotherapy, therefore she is agreement that we would not want to go down the road of further diagnostic testing on her lungs including CT scan and lung biopsy.  She thinks that if something happened to her she would not want to be put on life support.  Will refer to [facility].     Date: 08/13/2016  MRN# [id number] Amy [name] [birth year]   Amy [name] is a 83 y.o. old female seen in follow up for chief complaint of  Chief Complaint  Patient presents with  . Follow-up    pt c/o sob only no cough, chest tightness or wheezing.  Marland Kitchen atelectasis     HPI:   Patient is a 83 year old female, with  bronchitis and congestive heart failure, chronic debility.  CT chest 10/09/15 in comparison with previous images mild RML atelectasis, no specific endobronchial lesion is noted.  This was likely due to atelectasis secondary to immobility from hospitalization. Endobronchial lesion seems unlikely given that the initial CT at the beginning of the hospitalization showed no atelectasis.   desat walk 11/12/15: Baseline oxygen sat today on RA at rest 96% and HR 98. After walking 20 feet pt sat was 97% and HR 108, significant dyspnea, unsteady gait, needed assistance when walking. Took a few minutes for breathing to recover.   Medication:   Outpatient Encounter Prescriptions as of 08/18/2016  Medication Sig  . aspirin 81 MG tablet Take 81 mg by mouth daily.  . cloNIDine (CATAPRES - DOSED IN MG/24  HR) 0.3 mg/24hr patch Place 0.3 mg onto the skin once a week.   . clopidogrel (PLAVIX) 75 MG tablet Take 1 tablet (75 mg total) by mouth daily.  Marland Kitchen doxazosin (CARDURA) 8 MG tablet Take 1 tablet (8 mg total) by mouth 2 (two) times daily.  . famotidine (PEPCID) 20 MG  tablet Take 1 tablet (20 mg total) by mouth 2 (two) times daily.  . fluticasone (FLONASE) 50 MCG/ACT nasal spray Place into the nose.  . furosemide (LASIX) 20 MG tablet Take 1 tablet (20 mg total) by mouth 2 (two) times daily as needed.  . furosemide (LASIX) 20 MG tablet Take 1 tablet (20 mg total) by mouth daily as needed.  Marland Kitchen glimepiride (AMARYL) 4 MG tablet Take 4 mg by mouth daily before breakfast.  . ipratropium-albuterol (DUONEB) 0.5-2.5 (3) MG/3ML SOLN Take 3 mLs by nebulization every 4 (four) hours as needed (shortness of breath/wheezing.).  Marland Kitchen levothyroxine (SYNTHROID, LEVOTHROID) 125 MCG tablet Take 125 mcg by mouth daily.  Marland Kitchen losartan-hydrochlorothiazide (HYZAAR) 100-25 MG per tablet Take 1 tablet by mouth daily.    . metFORMIN (GLUCOPHAGE) 1000 MG tablet Take 1,000 mg by mouth 2 (two) times daily with a meal.  . Multiple Vitamins-Minerals (PRESERVISION AREDS PO) Take by mouth 2 (two) times daily.  Marland Kitchen oxybutynin (DITROPAN) 5 MG tablet Take 5 mg by mouth 1 dose over 46 hours.    . pantoprazole (PROTONIX) 40 MG tablet Take 40 mg by mouth daily.   Marland Kitchen senna (SENOKOT) 8.6 MG TABS tablet Take 1 tablet (8.6 mg total) by mouth daily as needed for mild constipation.  . simvastatin (ZOCOR) 40 MG tablet Take 1 tablet (40 mg total) by mouth at bedtime. (Patient taking differently: Take 40 mg by mouth daily. )  . SYMBICORT 160-4.5 MCG/ACT inhaler Inhale 2 puffs into the lungs 2 (two) times daily.    No facility-administered encounter medications on file as of 08/18/2016.      Allergies:  Atenolol; Codeine; Rofecoxib; and Sulfa antibiotics  Review of Systems: Gen:  Denies  fever, sweats. HEENT: Denies blurred vision. Cvc:  No dizziness, chest pain or heaviness Resp:   Denies cough or sputum porduction. Gi: Denies swallowing difficulty, stomach pain. constipation, bowel incontinence Gu:  Denies bladder incontinence, burning urine Ext:   No Joint pain, stiffness. Skin: No skin rash, easy  bruising. Endoc:  No polyuria, polydipsia. Psych: No depression, insomnia. Other:  All other systems were reviewed and found to be negative other than what is mentioned in the HPI.   Physical Examination:   VS: BP (!) 144/58 (BP Location: Right Arm, Cuff Size: Normal)   Pulse 67   Resp 16   Ht 5' 0.4" (1.534 m)   Wt 214 lb (97.1 kg)   SpO2 99%   BMI 41.24 kg/m   General Appearance: No distress  Neuro:without focal findings,  speech normal,  HEENT: PERRLA, EOM intact. Pulmonary: normal breath sounds, No wheezing.   CardiovascularNormal S1,S2.  No m/r/g.   Abdomen: Benign, Soft, non-tender. Renal:  No costovertebral tenderness  GU:  Not performed at this time. Endoc: No evident thyromegaly, no signs of acromegaly. Skin:   warm, no rash. Extremities: normal, no cyanosis, clubbing.   LABORATORY PANEL:   CBC No results for input(s): WBC, HGB, HCT, PLT in the last 168 hours. ------------------------------------------------------------------------------------------------------------------  Chemistries  No results for input(s): NA, K, CL, CO2, GLUCOSE, BUN, CREATININE, CALCIUM, MG, AST, ALT, ALKPHOS, BILITOT in the last 168 hours.  Invalid input(s): GFRCGP ------------------------------------------------------------------------------------------------------------------  Cardiac Enzymes No results for input(s): TROPONINI in the last 168 hours. ------------------------------------------------------------  RADIOLOGY:   No results found for this or any previous visit. Results for orders placed during the hospital encounter of 12/28/14  DG Chest 2 View   Narrative CLINICAL DATA:  Hemoptysis this morning ; shortness of breath on exertion, history of asthma, chronic CHF, obesity, and diabetes.  EXAM: CHEST  2 VIEW  COMPARISON:  Portable chest x-ray of October 11, 2013  FINDINGS: The lungs are well-expanded. There is no focal infiltrate. The interstitial markings are coarse.  The heart is top-normal in size. The pulmonary vascularity is not engorged. There is no pleural effusion. There is a moderate-sized hiatal hernia. There is apical pleural thickening bilaterally. There is mild degenerative disc space narrowing of the mid thoracic spine.  IMPRESSION: Acute on chronic bronchitic change.  There is no evidence of CHF.   Electronically Signed   By: David  Martinique M.D.   On: 12/28/2014 13:21    ------------------------------------------------------------------------------------------------------------------  Thank  you for allowing [facility] to assist in the care of your patient. Our recommendations are noted above.  Please contact us if we can be of further service.   Marda Stalker, MD.  [facility] Office Number: [phone]  Patricia Pesa, M.D.  Vilinda Boehringer, M.D.  Merton Border, M.D  08/13/2016

## 2016-08-15 ENCOUNTER — Telehealth: Payer: Self-pay | Admitting: Cardiovascular Disease

## 2016-08-15 NOTE — Telephone Encounter (Signed)
Lmov for patient to call back She is due for her Yearly Carotid

## 2016-08-18 ENCOUNTER — Other Ambulatory Visit: Payer: Self-pay

## 2016-08-18 ENCOUNTER — Ambulatory Visit (INDEPENDENT_AMBULATORY_CARE_PROVIDER_SITE_OTHER): Payer: Medicare Other | Admitting: Internal Medicine

## 2016-08-18 VITALS — BP 144/58 | HR 67 | Resp 16 | Ht 60.4 in | Wt 214.0 lb

## 2016-08-18 DIAGNOSIS — I25118 Atherosclerotic heart disease of native coronary artery with other forms of angina pectoris: Secondary | ICD-10-CM | POA: Diagnosis not present

## 2016-08-18 DIAGNOSIS — J9811 Atelectasis: Secondary | ICD-10-CM

## 2016-08-18 DIAGNOSIS — J9610 Chronic respiratory failure, unspecified whether with hypoxia or hypercapnia: Secondary | ICD-10-CM

## 2016-08-18 MED ORDER — SIMVASTATIN 40 MG PO TABS: 40.0000 mg | ORAL_TABLET | Freq: Every day | ORAL | 0 refills | Status: DC

## 2016-08-18 NOTE — Patient Instructions (Signed)
Continue to attend pulmonary rehab.   Will refer to Hospice/Palliative care to help with at home issues and living will planning.

## 2016-08-18 NOTE — Telephone Encounter (Signed)
Requested Prescriptions   Signed Prescriptions Disp Refills  . simvastatin (ZOCOR) 40 MG tablet 90 tablet 0    Sig: Take 1 tablet (40 mg total) by mouth daily.    Authorizing Provider: Minna Merritts    Ordering User: Janan Ridge

## 2016-08-19 ENCOUNTER — Ambulatory Visit: Payer: Medicare Other | Admitting: Internal Medicine

## 2016-08-19 DIAGNOSIS — H353211 Exudative age-related macular degeneration, right eye, with active choroidal neovascularization: Secondary | ICD-10-CM | POA: Diagnosis not present

## 2016-08-19 NOTE — Telephone Encounter (Signed)
Lmov for patient to call back She is due for her Yearly Carotid

## 2016-08-20 NOTE — Telephone Encounter (Signed)
Pt scheduled for 09/11/16

## 2016-08-22 ENCOUNTER — Other Ambulatory Visit: Payer: Self-pay

## 2016-08-22 ENCOUNTER — Other Ambulatory Visit: Payer: Self-pay | Admitting: Cardiovascular Disease

## 2016-08-22 MED ORDER — SIMVASTATIN 40 MG PO TABS: 40.0000 mg | ORAL_TABLET | Freq: Every day | ORAL | 3 refills | Status: DC

## 2016-08-22 NOTE — Telephone Encounter (Signed)
Requested Prescriptions   Signed Prescriptions Disp Refills  . simvastatin (ZOCOR) 40 MG tablet 90 tablet 3    Sig: Take 1 tablet (40 mg total) by mouth at bedtime.    Authorizing Provider: Minna Merritts    Ordering User: Janan Ridge

## 2016-08-25 ENCOUNTER — Other Ambulatory Visit: Payer: Self-pay | Admitting: Cardiovascular Disease

## 2016-08-25 DIAGNOSIS — I6523 Occlusion and stenosis of bilateral carotid arteries: Secondary | ICD-10-CM

## 2016-09-01 ENCOUNTER — Telehealth: Payer: Self-pay | Admitting: *Deleted

## 2016-09-01 ENCOUNTER — Ambulatory Visit: Payer: Medicare Other | Admitting: Podiatry

## 2016-09-01 DIAGNOSIS — I5032 Chronic diastolic (congestive) heart failure: Secondary | ICD-10-CM | POA: Diagnosis not present

## 2016-09-01 DIAGNOSIS — J45909 Unspecified asthma, uncomplicated: Secondary | ICD-10-CM | POA: Diagnosis not present

## 2016-09-01 DIAGNOSIS — R0600 Dyspnea, unspecified: Secondary | ICD-10-CM | POA: Diagnosis not present

## 2016-09-01 DIAGNOSIS — Z515 Encounter for palliative care: Secondary | ICD-10-CM | POA: Diagnosis not present

## 2016-09-01 NOTE — Telephone Encounter (Signed)
Spoke with Magda Paganini who will contact Pulmanology

## 2016-09-01 NOTE — Progress Notes (Signed)
Cardiology Office Note  Date:  09/03/2016   ID:  Amy [name], DOB 1928/02/24, MRN [id number]  PCP:  Amy Ramsay, MD   Chief Complaint  Patient presents with  . other    3 month follow up. Patient c/o SOB and Dizziness. Meds reviewed verbally with patient.     HPI:  Amy [name] is a pleasant 83 year old woman with a history of  coronary artery disease,  proximal LAD with stent  incontinence,  Beta blocker has been held in the past secondary to bradycardia.  lost her daughter, previous adjustment disorder Long history of poor diet COPD exacerbations morbid obesity Chronic baseline shortness of breath Echo 02/2015 EF 60%, mild AS, LVH who now presents for routine follow up of her coronary artery disease  In follow-up today she reports having occasional spinning with associated nausea Worse with moving her head, typically presents in a sitting position  She does not check her blood pressure at home  Reports having significant Neuropathy in her feet at nighttime, pins and needles in her feet  Presenting today in a wheelchair  Followed by pulmonary She has completed pulmonary rehabilitation, feels her breathing is improving, stable Still deconditioned with leg weakness Weight continues to run high No significant leg swelling Takes Lasix occasionally, HCTZ daily  EKG personally reviewed by myself on todays visit Oral sinus rhythm rate 57 bpm Right bundle branch block  Total chol 148 BMP in 09/2015, creatinine 1.2 BUN 23 HBA1C 6.4 Vit D 6.3 Total chol 156, LDL 74  Other past medical history reviewed hospitalization mid August 2017 COPD exacerbation, required steroids, DuoNeb's, flutter device, antibiotics CT scan showing mucus plugging, postobstructive atelectasis rehab, [facility] Now at home  Son in [place] helps her with her house chores  [facility] August 2015. She had diarrhea, vomiting, felt to be viral also with acute renal failure that  improved with hydration. Creatinine reached 1.86, one month later creatinine 1.0 as an outpatient Continues to be weak, does not walk much and is deconditioned  Guaiac positive and she had colonoscopy and EGD. She reports Amy [name] did not find anything Iron transfusions in the past under the direction of Dr. Ma [name].   Prior episode of shortness of breath while walking in the hospital to schedule a colonoscopy appointment. sent to the emergency room for further evaluation. Systolic pressure was in the 170 range. workup at that time was essentially normal and she was discharged home.  cardiac catheter performed at [facility] on June 22, 2009 details 90% proximal LAD, 40% mid LAD, 40% diagonal, 60% proximal circumflex followed by 40% circumflex lesion, 30%, 40% and 30% lesion noted in the RCA with distal RCA with 30% and 25% lesions. Mild atheroma in the aorta. Xience 2.75 x 12 mm Drug eluting stent placed.   stress test 01/14/2012 showing no ischemia, Lexiscan  PMH:   has a past medical history of Bladder incontinence; CAD (coronary artery disease); Chronic diastolic (congestive) heart failure (HCC); CKD (chronic kidney disease) stage 3, GFR 30-59 ml/min; Degenerative arthritis of knee; Diabetes mellitus; Hiatal hernia; Hypertension; Iron deficiency; Menopausal symptoms; Morbid obesity ([place]); and Thyroid disease.  PSH:    Past Surgical History:  Procedure Laterality Date  . COLONOSCOPY  2015  . REPLACEMENT TOTAL KNEE BILATERAL    . TOTAL VAGINAL HYSTERECTOMY     ovarian mass, not cancerous  . UPPER GI ENDOSCOPY  2015    Current Outpatient Prescriptions  Medication Sig Dispense Refill  . aspirin 81 MG tablet Take  81 mg by mouth daily.    . cloNIDine (CATAPRES - DOSED IN MG/24 HR) 0.3 mg/24hr patch Place 0.3 mg onto the skin once a week.     . clopidogrel (PLAVIX) 75 MG tablet Take 1 tablet (75 mg total) by mouth daily. 90 tablet 3  . doxazosin (CARDURA) 8 MG tablet Take 1 tablet (8  mg total) by mouth 2 (two) times daily. 180 tablet 3  . famotidine (PEPCID) 20 MG tablet Take 1 tablet (20 mg total) by mouth 2 (two) times daily. 180 tablet 1  . glimepiride (AMARYL) 4 MG tablet Take 4 mg by mouth daily before breakfast.    . ipratropium-albuterol (DUONEB) 0.5-2.5 (3) MG/3ML SOLN Take 3 mLs by nebulization every 4 (four) hours as needed (shortness of breath/wheezing.). 360 mL   . levothyroxine (SYNTHROID, LEVOTHROID) 125 MCG tablet Take 125 mcg by mouth daily.    Marland Kitchen losartan-hydrochlorothiazide (HYZAAR) 100-25 MG per tablet Take 1 tablet by mouth daily.      . metFORMIN (GLUCOPHAGE) 1000 MG tablet Take 1,000 mg by mouth 2 (two) times daily with a meal.    . Multiple Vitamins-Minerals (PRESERVISION AREDS PO) Take by mouth 2 (two) times daily.    Marland Kitchen oxybutynin (DITROPAN) 5 MG tablet Take 5 mg by mouth 1 dose over 46 hours.      . pantoprazole (PROTONIX) 40 MG tablet Take 40 mg by mouth daily.     Marland Kitchen senna (SENOKOT) 8.6 MG TABS tablet Take 1 tablet (8.6 mg total) by mouth daily as needed for mild constipation. 120 each 0  . simvastatin (ZOCOR) 40 MG tablet Take 1 tablet (40 mg total) by mouth at bedtime. 90 tablet 3  . SYMBICORT 160-4.5 MCG/ACT inhaler Inhale 2 puffs into the lungs 2 (two) times daily.     . vitamin B-12 (CYANOCOBALAMIN) 1000 MCG tablet Take 1 tablet by mouth daily.    . fluticasone (FLONASE) 50 MCG/ACT nasal spray Place into the nose.    . furosemide (LASIX) 20 MG tablet Take 1 tablet (20 mg total) by mouth daily as needed. 90 tablet 3   No current facility-administered medications for this visit.      Allergies:   Morphine and related; Atenolol; Codeine; Nsaids; Rofecoxib; and Sulfa antibiotics   Social History:  The patient  reports that she has never smoked. She has never used smokeless tobacco. She reports that she does not drink alcohol or use drugs.   Family History:   family history includes Breast cancer (age of onset: 36) in her mother.    Review of  Systems: Review of Systems  Constitutional: Negative.   Respiratory: Positive for shortness of breath.   Cardiovascular: Negative.   Gastrointestinal: Negative.   Musculoskeletal: Negative.        Leg weakness  Neurological: Positive for dizziness.  Psychiatric/Behavioral: Negative.   All other systems reviewed and are negative.    PHYSICAL EXAM: VS:  BP (!) 170/60 (BP Location: Right Arm, Patient Position: Sitting, Cuff Size: Normal)   Pulse (!) 57   Ht 5\' 2"  (1.575 m)   Wt 212 lb 8 oz (96.4 kg)   BMI 38.87 kg/m  , BMI Body mass index is 38.87 kg/m.  GEN: Well nourished, well developed, in no acute distress , Presents in a wheelchair, obese HEENT: normal  Neck: no JVD, carotid bruits, or masses Cardiac: RRR; no murmurs, rubs, or gallops,no edema  Respiratory:  clear to auscultation bilaterally, normal work of breathing GI: soft, nontender, nondistended, +  BS MS: no deformity or atrophy  Skin: warm and dry, no rash Neuro:  Strength and sensation are intact Psych: euthymic mood, full affect    Recent Labs: 10/01/2015: B Natriuretic Peptide 53.0; TSH 5.103 10/22/2015: ALT 17; BUN 43; Creatinine, Ser 1.48; Magnesium 2.1; Potassium 4.1; Sodium 138 05/27/2016: Platelets 232 05/29/2016: Hemoglobin 9.9    Lipid Panel Lab Results  Component Value Date   CHOL 135 06/25/2010   HDL 35 (L) 06/25/2010   LDLCALC 56 06/25/2010   TRIG 220 (H) 06/25/2010      Wt Readings from Last 3 Encounters:  09/03/16 212 lb 8 oz (96.4 kg)  08/18/16 214 lb (97.1 kg)  06/30/16 218 lb (98.9 kg)       ASSESSMENT AND PLAN:  SOB (shortness of breath) - Plan: EKG 12-Lead multifactorial including deconditioning, morbid obesity, chronic diastolic CHF, anemia Moderately improved symptoms with rehabilitation Continue heart track Limited in ability to lose weight  Coronary artery disease of native artery of native heart with stable angina pectoris ([place]) - Plan: EKG 12-Lead Currently with no  symptoms of angina. No further workup at this time. Continue current medication regimen.  Essential hypertension - Plan: EKG 12-Lead Blood pressure elevated on today's visit Recommended she start monitoring blood pressure at home and take clonidine pill  for systolic pressure greater than 160   Chronic diastolic CHF (congestive heart failure) ([place]) She takes Lasix periodically, HCTZ daily   appears relatively euvolemic  Paroxysmal atrial fibrillation ([place]) - Plan: EKG 12-Lead Maintaining normal sinus rhythm Not on anticoagulation  COPD exacerbation (HCC) - Plan: EKG 12-Lead Reports no COPD exacerbation   Acute renal failure superimposed on stage 3 chronic kidney disease, unspecified acute renal failure type (HCC) stable , creatinine 1.3   Anemia, unspecified type - Followed by Dr. Grayland Ormond, previous  iron infusion    Total encounter time more than 25 minutes  Greater than 50% was spent in counseling and coordination of care with the patient    Disposition:   F/U  6 months   No orders of the defined types were placed in this encounter.    Signed, Esmond Plants, M.D., Ph.D. 09/03/2016  [place], [place]

## 2016-09-01 NOTE — Telephone Encounter (Signed)
Called to report that patient is followed for IDA and Feraheme infusions here and that per Pulmonology, she has nodules in her lung and patient is wanting them biopsied to see if she has caner, she does not want to have treatment, but she does want hospice and a positive biopsy would qualify her for hospice services. Please call Magda Paganini back to discuss

## 2016-09-01 NOTE — Telephone Encounter (Signed)
Appears to be too difficult to biopsy. Plus, I believe pulmonary has evaluated her stating bronchoscopy was high risk. Would continue with recommendation of repeating CT scan to assess for interval change. This appears to have already been ordered by patient's primary care physician.

## 2016-09-01 NOTE — Telephone Encounter (Signed)
Left message on Leslies VM to call back

## 2016-09-03 ENCOUNTER — Ambulatory Visit (INDEPENDENT_AMBULATORY_CARE_PROVIDER_SITE_OTHER): Payer: Medicare Other | Admitting: Cardiovascular Disease

## 2016-09-03 ENCOUNTER — Encounter: Payer: Self-pay | Admitting: Cardiovascular Disease

## 2016-09-03 VITALS — BP 160/60 | HR 57 | Ht 62.0 in | Wt 212.5 lb

## 2016-09-03 DIAGNOSIS — I5032 Chronic diastolic (congestive) heart failure: Secondary | ICD-10-CM

## 2016-09-03 DIAGNOSIS — E782 Mixed hyperlipidemia: Secondary | ICD-10-CM

## 2016-09-03 DIAGNOSIS — I10 Essential (primary) hypertension: Secondary | ICD-10-CM | POA: Diagnosis not present

## 2016-09-03 DIAGNOSIS — I209 Angina pectoris, unspecified: Secondary | ICD-10-CM | POA: Diagnosis not present

## 2016-09-03 DIAGNOSIS — I25118 Atherosclerotic heart disease of native coronary artery with other forms of angina pectoris: Secondary | ICD-10-CM

## 2016-09-03 DIAGNOSIS — J441 Chronic obstructive pulmonary disease with (acute) exacerbation: Secondary | ICD-10-CM | POA: Diagnosis not present

## 2016-09-03 DIAGNOSIS — I6523 Occlusion and stenosis of bilateral carotid arteries: Secondary | ICD-10-CM | POA: Diagnosis not present

## 2016-09-03 MED ORDER — CLONIDINE HCL 0.1 MG PO TABS: 0.1000 mg | ORAL_TABLET | Freq: Two times a day (BID) | ORAL | 3 refills | Status: DC | PRN

## 2016-09-03 NOTE — Patient Instructions (Addendum)
Medication Instructions:   Please take clonidine pill as needed for systolic pressure >527  Try meclizine as needed up to three times a day for vertigo/nausea  Labwork:  No new labs needed  Testing/Procedures:  No further testing at this time   Follow-Up: It was a pleasure seeing you in the office today. Please call us if you have new issues that need to be addressed before your next appt.  [phone]  Your physician wants you to follow-up in: 6 months.  You will receive a reminder letter in the mail two months in advance. If you don't receive a letter, please call our office to schedule the follow-up appointment.  If you need a refill on your cardiac medications before your next appointment, please call your pharmacy.

## 2016-09-08 ENCOUNTER — Ambulatory Visit (INDEPENDENT_AMBULATORY_CARE_PROVIDER_SITE_OTHER): Payer: Medicare Other | Admitting: Podiatry

## 2016-09-08 ENCOUNTER — Encounter: Payer: Self-pay | Admitting: Podiatry

## 2016-09-08 DIAGNOSIS — B351 Tinea unguium: Secondary | ICD-10-CM

## 2016-09-08 DIAGNOSIS — E119 Type 2 diabetes mellitus without complications: Secondary | ICD-10-CM

## 2016-09-08 DIAGNOSIS — M79676 Pain in unspecified toe(s): Secondary | ICD-10-CM | POA: Diagnosis not present

## 2016-09-08 DIAGNOSIS — L608 Other nail disorders: Secondary | ICD-10-CM

## 2016-09-08 NOTE — Progress Notes (Signed)
Complaint:  Visit Type: Patient returns to my office for continued preventative foot care services. Complaint: Patient states" my nails have grown long and thick and become painful to walk and wear shoes" Patient has been diagnosed with DM with no foot complications. The patient presents for preventative foot care services. No changes to ROS  Podiatric Exam: Vascular: dorsalis pedis and posterior tibial pulses are palpable bilateral. Capillary return is immediate. Temperature gradient is WNL. Skin turgor WNL  Sensorium: Normal Semmes Weinstein monofilament test. Normal tactile sensation bilaterally. Nail Exam: Pt has thick disfigured discolored nails with subungual debris noted bilateral entire nail hallux through fifth toenails Ulcer Exam: There is no evidence of ulcer or pre-ulcerative changes or infection. Orthopedic Exam: Muscle tone and strength are WNL. No limitations in general ROM. No crepitus or effusions noted. Foot type and digits show no abnormalities. Bony prominences are unremarkable. Skin: No Porokeratosis. No infection or ulcers  Diagnosis:  Onychomycosis, , Pain in right toe, pain in left toes  Treatment & Plan Procedures and Treatment: Consent by patient was obtained for treatment procedures. The patient understood the discussion of treatment and procedures well. All questions were answered thoroughly reviewed. Debridement of mycotic and hypertrophic toenails, 1 through 5 bilateral and clearing of subungual debris. No ulceration, no infection noted.  Return Visit-Office Procedure: Patient instructed to return to the office for a follow up visit 3 months for continued evaluation and treatment.    Carroll Lingelbach DPM 

## 2016-09-11 ENCOUNTER — Ambulatory Visit: Payer: Medicare Other

## 2016-09-11 DIAGNOSIS — I6523 Occlusion and stenosis of bilateral carotid arteries: Secondary | ICD-10-CM

## 2016-09-12 LAB — VAS US CAROTID
LEFT ECA DIAS: 0 cm/s
LEFT VERTEBRAL DIAS: 17 cm/s
LICADDIAS: -22 cm/s
LICAPDIAS: 34 cm/s
Left CCA dist dias: -23 cm/s
Left CCA dist sys: -180 cm/s
Left CCA prox dias: 18 cm/s
Left CCA prox sys: 121 cm/s
Left ICA dist sys: -106 cm/s
Left ICA prox sys: 245 cm/s
RCCAPDIAS: 15 cm/s
RIGHT ECA DIAS: 0 cm/s
Right CCA prox sys: 81 cm/s
Right cca dist sys: 171 cm/s

## 2016-09-21 ENCOUNTER — Encounter: Payer: Self-pay | Admitting: Emergency Medicine

## 2016-09-21 ENCOUNTER — Emergency Department: Payer: Medicare Other

## 2016-09-21 ENCOUNTER — Telehealth: Payer: Self-pay | Admitting: Physician Assistant

## 2016-09-21 ENCOUNTER — Inpatient Hospital Stay
Admission: EM | Admit: 2016-09-21 | Discharge: 2016-09-26 | DRG: 378 | Disposition: A | Discharge status: Home / Self Care | Payer: Medicare Other | Attending: Internal Medicine | Admitting: Internal Medicine

## 2016-09-21 DIAGNOSIS — Z7951 Long term (current) use of inhaled steroids: Secondary | ICD-10-CM

## 2016-09-21 DIAGNOSIS — E785 Hyperlipidemia, unspecified: Secondary | ICD-10-CM | POA: Diagnosis present

## 2016-09-21 DIAGNOSIS — D62 Acute posthemorrhagic anemia: Secondary | ICD-10-CM | POA: Diagnosis not present

## 2016-09-21 DIAGNOSIS — I517 Cardiomegaly: Secondary | ICD-10-CM | POA: Diagnosis not present

## 2016-09-21 DIAGNOSIS — Z96653 Presence of artificial knee joint, bilateral: Secondary | ICD-10-CM | POA: Diagnosis present

## 2016-09-21 DIAGNOSIS — I130 Hypertensive heart and chronic kidney disease with heart failure and stage 1 through stage 4 chronic kidney disease, or unspecified chronic kidney disease: Secondary | ICD-10-CM | POA: Diagnosis present

## 2016-09-21 DIAGNOSIS — Z885 Allergy status to narcotic agent status: Secondary | ICD-10-CM | POA: Diagnosis not present

## 2016-09-21 DIAGNOSIS — K640 First degree hemorrhoids: Secondary | ICD-10-CM | POA: Diagnosis present

## 2016-09-21 DIAGNOSIS — I5032 Chronic diastolic (congestive) heart failure: Secondary | ICD-10-CM | POA: Diagnosis present

## 2016-09-21 DIAGNOSIS — Z7984 Long term (current) use of oral hypoglycemic drugs: Secondary | ICD-10-CM

## 2016-09-21 DIAGNOSIS — E039 Hypothyroidism, unspecified: Secondary | ICD-10-CM | POA: Diagnosis present

## 2016-09-21 DIAGNOSIS — M170 Bilateral primary osteoarthritis of knee: Secondary | ICD-10-CM | POA: Diagnosis present

## 2016-09-21 DIAGNOSIS — E119 Type 2 diabetes mellitus without complications: Secondary | ICD-10-CM | POA: Diagnosis not present

## 2016-09-21 DIAGNOSIS — D500 Iron deficiency anemia secondary to blood loss (chronic): Secondary | ICD-10-CM

## 2016-09-21 DIAGNOSIS — I959 Hypotension, unspecified: Secondary | ICD-10-CM | POA: Diagnosis not present

## 2016-09-21 DIAGNOSIS — J449 Chronic obstructive pulmonary disease, unspecified: Secondary | ICD-10-CM | POA: Diagnosis not present

## 2016-09-21 DIAGNOSIS — Z7902 Long term (current) use of antithrombotics/antiplatelets: Secondary | ICD-10-CM | POA: Diagnosis not present

## 2016-09-21 DIAGNOSIS — E875 Hyperkalemia: Secondary | ICD-10-CM | POA: Diagnosis not present

## 2016-09-21 DIAGNOSIS — S0990XA Unspecified injury of head, initial encounter: Secondary | ICD-10-CM | POA: Diagnosis not present

## 2016-09-21 DIAGNOSIS — D122 Benign neoplasm of ascending colon: Secondary | ICD-10-CM | POA: Diagnosis not present

## 2016-09-21 DIAGNOSIS — R531 Weakness: Secondary | ICD-10-CM | POA: Diagnosis not present

## 2016-09-21 DIAGNOSIS — K922 Gastrointestinal hemorrhage, unspecified: Secondary | ICD-10-CM | POA: Diagnosis present

## 2016-09-21 DIAGNOSIS — Z882 Allergy status to sulfonamides status: Secondary | ICD-10-CM

## 2016-09-21 DIAGNOSIS — N179 Acute kidney failure, unspecified: Secondary | ICD-10-CM | POA: Diagnosis present

## 2016-09-21 DIAGNOSIS — E1122 Type 2 diabetes mellitus with diabetic chronic kidney disease: Secondary | ICD-10-CM | POA: Diagnosis present

## 2016-09-21 DIAGNOSIS — K5731 Diverticulosis of large intestine without perforation or abscess with bleeding: Principal | ICD-10-CM | POA: Diagnosis present

## 2016-09-21 DIAGNOSIS — K635 Polyp of colon: Secondary | ICD-10-CM | POA: Diagnosis present

## 2016-09-21 DIAGNOSIS — N183 Chronic kidney disease, stage 3 (moderate): Secondary | ICD-10-CM | POA: Diagnosis present

## 2016-09-21 DIAGNOSIS — Z7982 Long term (current) use of aspirin: Secondary | ICD-10-CM

## 2016-09-21 DIAGNOSIS — D124 Benign neoplasm of descending colon: Secondary | ICD-10-CM | POA: Diagnosis not present

## 2016-09-21 DIAGNOSIS — Z888 Allergy status to other drugs, medicaments and biological substances status: Secondary | ICD-10-CM

## 2016-09-21 DIAGNOSIS — I509 Heart failure, unspecified: Secondary | ICD-10-CM | POA: Diagnosis not present

## 2016-09-21 DIAGNOSIS — Z6838 Body mass index (BMI) 38.0-38.9, adult: Secondary | ICD-10-CM

## 2016-09-21 DIAGNOSIS — Z79899 Other long term (current) drug therapy: Secondary | ICD-10-CM

## 2016-09-21 DIAGNOSIS — I110 Hypertensive heart disease with heart failure: Secondary | ICD-10-CM | POA: Diagnosis not present

## 2016-09-21 DIAGNOSIS — K449 Diaphragmatic hernia without obstruction or gangrene: Secondary | ICD-10-CM | POA: Diagnosis not present

## 2016-09-21 DIAGNOSIS — K625 Hemorrhage of anus and rectum: Secondary | ICD-10-CM | POA: Diagnosis not present

## 2016-09-21 DIAGNOSIS — S0083XA Contusion of other part of head, initial encounter: Secondary | ICD-10-CM

## 2016-09-21 DIAGNOSIS — Z66 Do not resuscitate: Secondary | ICD-10-CM | POA: Diagnosis present

## 2016-09-21 DIAGNOSIS — D125 Benign neoplasm of sigmoid colon: Secondary | ICD-10-CM | POA: Diagnosis not present

## 2016-09-21 DIAGNOSIS — I2510 Atherosclerotic heart disease of native coronary artery without angina pectoris: Secondary | ICD-10-CM | POA: Diagnosis not present

## 2016-09-21 DIAGNOSIS — R55 Syncope and collapse: Secondary | ICD-10-CM

## 2016-09-21 DIAGNOSIS — K921 Melena: Secondary | ICD-10-CM

## 2016-09-21 DIAGNOSIS — S8992XA Unspecified injury of left lower leg, initial encounter: Secondary | ICD-10-CM | POA: Diagnosis not present

## 2016-09-21 DIAGNOSIS — W19XXXA Unspecified fall, initial encounter: Secondary | ICD-10-CM

## 2016-09-21 LAB — CBC WITH DIFFERENTIAL/PLATELET
Basophils Absolute: 0.1 10*3/uL (ref 0–0.1)
Basophils Relative: 1 %
EOS PCT: 1 %
Eosinophils Absolute: 0.1 10*3/uL (ref 0–0.7)
HCT: 24.6 % — ABNORMAL LOW (ref 35.0–47.0)
Hemoglobin: 7.9 g/dL — ABNORMAL LOW (ref 12.0–16.0)
LYMPHS ABS: 1.5 10*3/uL (ref 1.0–3.6)
LYMPHS PCT: 13 %
MCH: 26.7 pg (ref 26.0–34.0)
MCHC: 32.2 g/dL (ref 32.0–36.0)
MCV: 83.1 fL (ref 80.0–100.0)
MONO ABS: 1 10*3/uL — AB (ref 0.2–0.9)
MONOS PCT: 9 %
Neutro Abs: 8.4 10*3/uL — ABNORMAL HIGH (ref 1.4–6.5)
Neutrophils Relative %: 76 %
PLATELETS: 285 10*3/uL (ref 150–440)
RBC: 2.96 MIL/uL — ABNORMAL LOW (ref 3.80–5.20)
RDW: 16.1 % — AB (ref 11.5–14.5)
WBC: 11 10*3/uL (ref 3.6–11.0)

## 2016-09-21 LAB — BASIC METABOLIC PANEL
Anion gap: 9 (ref 5–15)
BUN: 42 mg/dL — AB (ref 6–20)
CALCIUM: 9.1 mg/dL (ref 8.9–10.3)
CO2: 25 mmol/L (ref 22–32)
Chloride: 107 mmol/L (ref 101–111)
Creatinine, Ser: 1.78 mg/dL — ABNORMAL HIGH (ref 0.44–1.00)
GFR calc Af Amer: 28 mL/min — ABNORMAL LOW (ref >60)
GFR, EST NON AFRICAN AMERICAN: 24 mL/min — AB (ref >60)
GLUCOSE: 75 mg/dL (ref 65–99)
Potassium: 5.8 mmol/L — ABNORMAL HIGH (ref 3.5–5.1)
Sodium: 141 mmol/L (ref 135–145)

## 2016-09-21 LAB — PREPARE RBC (CROSSMATCH)

## 2016-09-21 LAB — GLUCOSE, CAPILLARY: Glucose-Capillary: 185 mg/dL — ABNORMAL HIGH (ref 65–99)

## 2016-09-21 LAB — POTASSIUM: POTASSIUM: 5.2 mmol/L — AB (ref 3.5–5.1)

## 2016-09-21 LAB — ABO/RH: ABO/RH(D): O POS

## 2016-09-21 LAB — PROTIME-INR
INR: 1.06
Prothrombin Time: 13.8 seconds (ref 11.4–15.2)

## 2016-09-21 LAB — TROPONIN I: Troponin I: <0.03 ng/mL (ref <0.03)

## 2016-09-21 MED ORDER — LOSARTAN POTASSIUM 50 MG PO TABS
100.0000 mg | ORAL_TABLET | Freq: Every day | ORAL | Status: DC
  Administered 2016-09-21: 100 mg via ORAL
  Filled 2016-09-21: qty 2

## 2016-09-21 MED ORDER — LEVOTHYROXINE SODIUM 50 MCG PO TABS
125.0000 ug | ORAL_TABLET | Freq: Every day | ORAL | Status: DC
  Administered 2016-09-21 – 2016-09-26 (×6): 125 ug via ORAL
  Filled 2016-09-21 (×4): qty 3
  Filled 2016-09-21: qty 1
  Filled 2016-09-21 (×2): qty 3

## 2016-09-21 MED ORDER — PANTOPRAZOLE SODIUM 40 MG IV SOLR
40.0000 mg | Freq: Once | INTRAVENOUS | Status: AC
  Administered 2016-09-21: 40 mg via INTRAVENOUS
  Filled 2016-09-21: qty 40

## 2016-09-21 MED ORDER — PANTOPRAZOLE SODIUM 40 MG IV SOLR
40.0000 mg | Freq: Two times a day (BID) | INTRAVENOUS | Status: DC
  Administered 2016-09-21 – 2016-09-26 (×9): 40 mg via INTRAVENOUS
  Filled 2016-09-21 (×10): qty 40

## 2016-09-21 MED ORDER — GLIMEPIRIDE 4 MG PO TABS
4.0000 mg | ORAL_TABLET | Freq: Every day | ORAL | Status: DC
  Administered 2016-09-22 – 2016-09-23 (×2): 4 mg via ORAL
  Filled 2016-09-21 (×2): qty 1

## 2016-09-21 MED ORDER — ACETAMINOPHEN 325 MG PO TABS: 650.0000 mg | ORAL_TABLET | Freq: Four times a day (QID) | ORAL | Status: DC | PRN

## 2016-09-21 MED ORDER — SODIUM CHLORIDE 0.9 % IV SOLN
INTRAVENOUS | Status: DC
  Administered 2016-09-21: 16:00:00 via INTRAVENOUS

## 2016-09-21 MED ORDER — SODIUM CHLORIDE 0.9 % IV SOLN: 10.0000 mL/h | Freq: Once | INTRAVENOUS | Status: DC

## 2016-09-21 MED ORDER — DOXAZOSIN MESYLATE 4 MG PO TABS
8.0000 mg | ORAL_TABLET | Freq: Two times a day (BID) | ORAL | Status: DC
  Administered 2016-09-21 – 2016-09-26 (×8): 8 mg via ORAL
  Filled 2016-09-21 (×7): qty 2
  Filled 2016-09-21: qty 1
  Filled 2016-09-21 (×3): qty 2

## 2016-09-21 MED ORDER — FUROSEMIDE 10 MG/ML IJ SOLN
20.0000 mg | Freq: Once | INTRAMUSCULAR | Status: AC
  Administered 2016-09-21: 20 mg via INTRAVENOUS
  Filled 2016-09-21: qty 4

## 2016-09-21 MED ORDER — IPRATROPIUM-ALBUTEROL 0.5-2.5 (3) MG/3ML IN SOLN: 3.0000 mL | RESPIRATORY_TRACT | Status: DC | PRN

## 2016-09-21 MED ORDER — ADULT MULTIVITAMIN W/MINERALS CH
1.0000 | ORAL_TABLET | Freq: Every day | ORAL | Status: DC
  Administered 2016-09-21 – 2016-09-26 (×5): 1 via ORAL
  Filled 2016-09-21 (×5): qty 1

## 2016-09-21 MED ORDER — SODIUM CHLORIDE 0.9 % IV BOLUS (SEPSIS): 500.0000 mL | Freq: Once | INTRAVENOUS | Status: DC

## 2016-09-21 MED ORDER — ONDANSETRON HCL 4 MG PO TABS: 4.0000 mg | ORAL_TABLET | Freq: Four times a day (QID) | ORAL | Status: DC | PRN

## 2016-09-21 MED ORDER — ONDANSETRON HCL 4 MG/2ML IJ SOLN
4.0000 mg | Freq: Four times a day (QID) | INTRAMUSCULAR | Status: DC | PRN
  Administered 2016-09-24: 4 mg via INTRAVENOUS
  Filled 2016-09-21: qty 2

## 2016-09-21 MED ORDER — SENNA 8.6 MG PO TABS: 1.0000 | ORAL_TABLET | Freq: Every day | ORAL | Status: DC | PRN

## 2016-09-21 MED ORDER — MOMETASONE FURO-FORMOTEROL FUM 200-5 MCG/ACT IN AERO
2.0000 | INHALATION_SPRAY | Freq: Two times a day (BID) | RESPIRATORY_TRACT | Status: DC
  Administered 2016-09-21 – 2016-09-26 (×10): 2 via RESPIRATORY_TRACT
  Filled 2016-09-21: qty 8.8

## 2016-09-21 MED ORDER — CLONIDINE HCL 0.3 MG/24HR TD PTWK: 0.3000 mg | MEDICATED_PATCH | TRANSDERMAL | Status: DC

## 2016-09-21 MED ORDER — OXYBUTYNIN CHLORIDE 5 MG PO TABS
5.0000 mg | ORAL_TABLET | Freq: Three times a day (TID) | ORAL | Status: DC
  Administered 2016-09-21 – 2016-09-26 (×13): 5 mg via ORAL
  Filled 2016-09-21 (×13): qty 1

## 2016-09-21 MED ORDER — SIMVASTATIN 40 MG PO TABS
40.0000 mg | ORAL_TABLET | Freq: Every day | ORAL | Status: DC
  Administered 2016-09-21 – 2016-09-25 (×4): 40 mg via ORAL
  Filled 2016-09-21 (×4): qty 1
  Filled 2016-09-21: qty 4
  Filled 2016-09-21: qty 1

## 2016-09-21 MED ORDER — ACETAMINOPHEN 650 MG RE SUPP
650.0000 mg | Freq: Four times a day (QID) | RECTAL | Status: DC | PRN
Start: 2016-09-21 — End: 2016-09-26

## 2016-09-21 MED ORDER — VITAMIN B-12 1000 MCG PO TABS
1000.0000 ug | ORAL_TABLET | Freq: Every day | ORAL | Status: DC
  Administered 2016-09-21 – 2016-09-26 (×5): 1000 ug via ORAL
  Filled 2016-09-21 (×5): qty 1

## 2016-09-21 MED ORDER — SODIUM CHLORIDE 0.9 % IV BOLUS (SEPSIS)
500.0000 mL | Freq: Once | INTRAVENOUS | Status: AC
  Administered 2016-09-21: 500 mL via INTRAVENOUS

## 2016-09-21 NOTE — Telephone Encounter (Signed)
Can we call to follow up

## 2016-09-21 NOTE — ED Notes (Signed)
Pt states "I am sore all over. My head, shoulders, and knees hurt." Pt was repositioned and is now resting comfortably.

## 2016-09-21 NOTE — Consult Note (Signed)
Reason for Consult:GI Blood loss anemia Referring Physician: Dr. Foster Simpson [name] is an 83 y.o. female.  HPI: Seen in consultation at the request of Dr. Edwina [name] for GI blood loss anemia. The history is obtained through the patient, her son and granddaughter at the bedside, and review of EPIC. She has CAD, chronic congestive heart failure, chronic kidney disease, and diabetes.  She has a history of chronic iron deficiency anemia. She currently receives IV infusions every 3 months after failing oral supplements. In 2015/2016 she had a full GI evaluation with Dr. Vira [name] including EGD, colonoscopy, and capsule endoscopy that were nondiagnostic. She has no chronic GI symptoms except notes some melanic, malodorous stools over the last couple of months and a dull, nonradiating lower abdominal pain that only occurs at night and resolves spontaneously after several minutes, and a couple weeks of increasing difficulty swallowing her own saliva without dysphagia to other liquids, solids, or pills. She has been concerned about the possibility of GI bleeding with the change in her stools.   She presented today after a near syncopal event were she was too weak to get out of bed. Her initial hypotension in the ER improved with IV hydration.   She is currently on ASA and Plavix with her last dose of Plavix yesterday. She denies the use of any NSAIDs. Her mother had colon cancer but died at age 39. Her son and daughter have both had colon polyps.  Colonoscopy 06/30/13: 4 tubular adenomas, diverticulosis, internal hemorrhoids EGD 06/30/13: moderate hiatal hernia, gastritis  Capsule endoscopy: normal per documented phone call to the patient  Past Medical History:  Diagnosis Date  . Bladder incontinence   . CAD (coronary artery disease)    a. cardiac cath 05/2009: proximal LAD 90% stenosis s/p PCI/Xience 2.75 x 12 mm DES, mid LAD 40%, diagonal 40%, proximal LCx 40% followed by 40% LCx lesion, 30%-40%-30%  lesion noted in the RCA with distal RCA 30% and 25% lesions  . Chronic diastolic (congestive) heart failure ([place])    a. Lexiscan 2013: no ischemia, normal EF b. echo 09/2015: EF 55-60% w/ Grade 1 DD  . CKD (chronic kidney disease) stage 3, GFR 30-59 ml/min   . Degenerative arthritis of knee    bilateral knees  . Diabetes mellitus    Type II  . Hiatal hernia   . Hypertension   . Iron deficiency   . Menopausal symptoms   . Morbid obesity ([place])   . Thyroid disease    hypothyroidism    Past Surgical History:  Procedure Laterality Date  . COLONOSCOPY  2015  . REPLACEMENT TOTAL KNEE BILATERAL    . TOTAL VAGINAL HYSTERECTOMY     ovarian mass, not cancerous  . UPPER GI ENDOSCOPY  2015    Family History  Problem Relation Age of Onset  . Breast cancer Mother 37    Social History:  reports that she has never smoked. She has never used smokeless tobacco. She reports that she does not drink alcohol or use drugs.  Allergies:  Allergies  Allergen Reactions  . Morphine And Related Shortness Of Breath  . Atenolol     Other reaction(s): Other (See Comments) Decreased heart rate  . Codeine     Rash, difficulty breathing, nausea.  . Nsaids     Other reaction(s): Unknown  . Rofecoxib     Other reaction(s): Unknown  . Sulfa Antibiotics     Other reaction(s): Unknown    Medications:  I have reviewed  the patient's current medications. Prior to Admission:  Prescriptions Prior to Admission  Medication Sig Dispense Refill Last Dose  . aspirin 81 MG tablet Take 81 mg by mouth daily.   09/20/2016 at am  . cloNIDine (CATAPRES - DOSED IN MG/24 HR) 0.3 mg/24hr patch Place 0.3 mg onto the skin once a week.    09/17/2016  . cloNIDine (CATAPRES) 0.1 MG tablet Take 1 tablet (0.1 mg total) by mouth 2 (two) times daily as needed. Take for pressure >160 180 tablet 3 prn at prn  . clopidogrel (PLAVIX) 75 MG tablet Take 1 tablet (75 mg total) by mouth daily. 90 tablet 3 09/20/2016 at am  . doxazosin  (CARDURA) 8 MG tablet Take 1 tablet (8 mg total) by mouth 2 (two) times daily. 180 tablet 3 09/20/2016 at pm  . furosemide (LASIX) 20 MG tablet Take 1 tablet (20 mg total) by mouth daily as needed. (Patient taking differently: Take 20 mg by mouth daily. ) 90 tablet 3 09/20/2016 at am  . glimepiride (AMARYL) 4 MG tablet Take 4 mg by mouth daily before breakfast.   09/20/2016 at am  . ipratropium-albuterol (DUONEB) 0.5-2.5 (3) MG/3ML SOLN Take 3 mLs by nebulization every 4 (four) hours as needed (shortness of breath/wheezing.). 360 mL  prn at prn  . levothyroxine (SYNTHROID, LEVOTHROID) 125 MCG tablet Take 125 mcg by mouth daily.   09/20/2016 at am  . losartan (COZAAR) 100 MG tablet Take 100 mg by mouth daily.   09/20/2016 at am  . metFORMIN (GLUCOPHAGE) 1000 MG tablet Take 1,000 mg by mouth 2 (two) times daily with a meal.   09/20/2016 at pm  . Multiple Vitamin (MULTIVITAMIN) tablet Take 1 tablet by mouth daily.   09/20/2016 at am  . oxybutynin (DITROPAN) 5 MG tablet Take 5 mg by mouth as needed.    prn at prn  . pantoprazole (PROTONIX) 40 MG tablet Take 40 mg by mouth daily.    09/20/2016 at am  . senna (SENOKOT) 8.6 MG TABS tablet Take 1 tablet (8.6 mg total) by mouth daily as needed for mild constipation. 120 each 0 prn at prn  . simvastatin (ZOCOR) 40 MG tablet Take 1 tablet (40 mg total) by mouth at bedtime. 90 tablet 3 09/20/2016 at qhs  . SYMBICORT 160-4.5 MCG/ACT inhaler Inhale 2 puffs into the lungs 2 (two) times daily.    09/20/2016 at pm  . vitamin B-12 (CYANOCOBALAMIN) 1000 MCG tablet Take 1 tablet by mouth daily.   09/20/2016 at am  . famotidine (PEPCID) 20 MG tablet Take 1 tablet (20 mg total) by mouth 2 (two) times daily. (Patient not taking: Reported on 09/21/2016) 180 tablet 1 Not Taking at Unknown time   Scheduled: . [START ON 09/24/2016] cloNIDine  0.3 mg Transdermal Weekly  . doxazosin  8 mg Oral BID  . furosemide  20 mg Intravenous Once  . [START ON 09/22/2016] glimepiride  4 mg Oral QAC  breakfast  . levothyroxine  125 mcg Oral QAC breakfast  . losartan  100 mg Oral Daily  . mometasone-formoterol  2 puff Inhalation BID  . multivitamin with minerals  1 tablet Oral Daily  . oxybutynin  5 mg Oral TID  . pantoprazole (PROTONIX) IV  40 mg Intravenous Q12H  . simvastatin  40 mg Oral QHS  . vitamin B-12  1,000 mcg Oral Daily   Continuous: . sodium chloride    . sodium chloride 10 mL/hr at 09/21/16 1552   HXT:[id number] **OR** acetaminophen, ipratropium-albuterol, ondansetron **  OR** ondansetron (ZOFRAN) IV, senna  Results for orders placed or performed during the hospital encounter of 09/21/16 (from the past 48 hour(s))  Troponin I     Status: None   Collection Time: 09/21/16 10:25 AM  Result Value Ref Range   Troponin I <0.03 <0.03 ng/mL  CBC with Differential     Status: Abnormal   Collection Time: 09/21/16 10:25 AM  Result Value Ref Range   WBC 11.0 3.6 - 11.0 K/uL   RBC 2.96 (L) 3.80 - 5.20 MIL/uL   Hemoglobin 7.9 (L) 12.0 - 16.0 g/dL   HCT 24.6 (L) 35.0 - 47.0 %   MCV 83.1 80.0 - 100.0 fL   MCH 26.7 26.0 - 34.0 pg   MCHC 32.2 32.0 - 36.0 g/dL   RDW 16.1 (H) 11.5 - 14.5 %   Platelets 285 150 - 440 K/uL   Neutrophils Relative % 76 %   Neutro Abs 8.4 (H) 1.4 - 6.5 K/uL   Lymphocytes Relative 13 %   Lymphs Abs 1.5 1.0 - 3.6 K/uL   Monocytes Relative 9 %   Monocytes Absolute 1.0 (H) 0.2 - 0.9 K/uL   Eosinophils Relative 1 %   Eosinophils Absolute 0.1 0 - 0.7 K/uL   Basophils Relative 1 %   Basophils Absolute 0.1 0 - 0.1 K/uL  Basic metabolic panel     Status: Abnormal   Collection Time: 09/21/16 10:25 AM  Result Value Ref Range   Sodium 141 135 - 145 mmol/L   Potassium 5.8 (H) 3.5 - 5.1 mmol/L   Chloride 107 101 - 111 mmol/L   CO2 25 22 - 32 mmol/L   Glucose, Bld 75 65 - 99 mg/dL   BUN 42 (H) 6 - 20 mg/dL   Creatinine, Ser 1.78 (H) 0.44 - 1.00 mg/dL   Calcium 9.1 8.9 - 10.3 mg/dL   GFR calc non Af Amer 24 (L) >60 mL/min   GFR calc Af Amer 28 (L) >60  mL/min    Comment: (NOTE) The eGFR has been calculated using the CKD EPI equation. This calculation has not been validated in all clinical situations. eGFR's persistently <60 mL/min signify possible Chronic Kidney Disease.    Anion gap 9 5 - 15  Protime-INR     Status: None   Collection Time: 09/21/16 11:59 AM  Result Value Ref Range   Prothrombin Time 13.8 11.4 - 15.2 seconds   INR 1.06   Type and screen [place]     Status: None (Preliminary result)   Collection Time: 09/21/16 12:00 PM  Result Value Ref Range   ABO/RH(D) O POS    Antibody Screen NEG    Sample Expiration 09/24/2016    Unit Number [id number]    Blood Component Type RED CELLS,LR    Unit division 00    Status of Unit ALLOCATED    Transfusion Status OK TO TRANSFUSE    Crossmatch Result Compatible    Unit Number [id number]    Blood Component Type RED CELLS,LR    Unit division 00    Status of Unit ISSUED    Transfusion Status OK TO TRANSFUSE    Crossmatch Result Compatible   Prepare RBC     Status: None   Collection Time: 09/21/16 12:00 PM  Result Value Ref Range   Order Confirmation ORDER PROCESSED BY BLOOD BANK   ABO/Rh     Status: None   Collection Time: 09/21/16  1:40 PM  Result Value Ref Range   ABO/RH(D)  O POS   Potassium     Status: Abnormal   Collection Time: 09/21/16  4:01 PM  Result Value Ref Range   Potassium 5.2 (H) 3.5 - 5.1 mmol/L  Glucose, capillary     Status: Abnormal   Collection Time: 09/21/16  5:16 PM  Result Value Ref Range   Glucose-Capillary 185 (H) 65 - 99 mg/dL   Comment 1 Notify RN     Ct Head Wo Contrast  Result Date: 09/21/2016 CLINICAL DATA:  Syncope.  Closed head injury. EXAM: CT HEAD WITHOUT CONTRAST CT MAXILLOFACIAL WITHOUT CONTRAST CT CERVICAL SPINE WITHOUT CONTRAST TECHNIQUE: Multidetector CT imaging of the head, cervical spine, and maxillofacial structures were performed using the standard protocol without intravenous contrast. Multiplanar  CT image reconstructions of the cervical spine and maxillofacial structures were also generated. COMPARISON:  None. FINDINGS: CT HEAD FINDINGS Brain: No evidence of acute infarction, hemorrhage, hydrocephalus, extra-axial collection or mass lesion/mass effect. Mild patchy areas of low attenuation are identified within the subcortical and periventricular white matter compatible with chronic small vessel ischemic change. Vascular: No hyperdense vessel or unexpected calcification. Skull: Normal. Negative for fracture or focal lesion. Other: None. CT MAXILLOFACIAL FINDINGS Osseous: No fracture or mandibular dislocation. No destructive process. Orbits: Negative. No traumatic or inflammatory finding. Sinuses: Very mild mucosal thickening involving the dependent portion of the left maxillary sinus, image 34 of series 18. Soft tissues: Subcutaneous soft tissue stranding along the left side of face is identified compatible with contusion. CT CERVICAL SPINE FINDINGS Alignment: There is normal alignment of the cervical spine. Skull base and vertebrae: No acute fracture. No primary bone lesion or focal pathologic process. Soft tissues and spinal canal: No prevertebral fluid or swelling. No visible canal hematoma. Disc levels: Multi level disc space narrowing and ventral endplate spurring is identified. This is most advanced at C6-7. Upper chest: Negative. Other: None IMPRESSION: 1. No acute intracranial abnormalities. Small vessel ischemic change. 2. Left facial contusion. 3. No evidence for facial bone fracture 4. No evidence for cervical spine fracture. 5. Cervical degenerative disc disease. Electronically Signed   By: Kerby Moors M.D.   On: 09/21/2016 11:13   Ct Cervical Spine Wo Contrast  Result Date: 09/21/2016 CLINICAL DATA:  Syncope.  Closed head injury. EXAM: CT HEAD WITHOUT CONTRAST CT MAXILLOFACIAL WITHOUT CONTRAST CT CERVICAL SPINE WITHOUT CONTRAST TECHNIQUE: Multidetector CT imaging of the head, cervical  spine, and maxillofacial structures were performed using the standard protocol without intravenous contrast. Multiplanar CT image reconstructions of the cervical spine and maxillofacial structures were also generated. COMPARISON:  None. FINDINGS: CT HEAD FINDINGS Brain: No evidence of acute infarction, hemorrhage, hydrocephalus, extra-axial collection or mass lesion/mass effect. Mild patchy areas of low attenuation are identified within the subcortical and periventricular white matter compatible with chronic small vessel ischemic change. Vascular: No hyperdense vessel or unexpected calcification. Skull: Normal. Negative for fracture or focal lesion. Other: None. CT MAXILLOFACIAL FINDINGS Osseous: No fracture or mandibular dislocation. No destructive process. Orbits: Negative. No traumatic or inflammatory finding. Sinuses: Very mild mucosal thickening involving the dependent portion of the left maxillary sinus, image 34 of series 18. Soft tissues: Subcutaneous soft tissue stranding along the left side of face is identified compatible with contusion. CT CERVICAL SPINE FINDINGS Alignment: There is normal alignment of the cervical spine. Skull base and vertebrae: No acute fracture. No primary bone lesion or focal pathologic process. Soft tissues and spinal canal: No prevertebral fluid or swelling. No visible canal hematoma. Disc levels: Multi level disc space  narrowing and ventral endplate spurring is identified. This is most advanced at C6-7. Upper chest: Negative. Other: None IMPRESSION: 1. No acute intracranial abnormalities. Small vessel ischemic change. 2. Left facial contusion. 3. No evidence for facial bone fracture 4. No evidence for cervical spine fracture. 5. Cervical degenerative disc disease. Electronically Signed   By: Kerby Moors M.D.   On: 09/21/2016 11:13   Dg Chest Port 1 View  Result Date: 09/21/2016 CLINICAL DATA:  Patient with weakness. EXAM: PORTABLE CHEST 1 VIEW COMPARISON:  Chest radiograph  10/08/2015 FINDINGS: Monitoring leads overlie the patient. Stable cardiomegaly. Calcification of the thoracic aorta. No consolidative pulmonary opacities. No pleural effusion or pneumothorax. IMPRESSION: Cardiomegaly.  No acute cardiopulmonary process. Electronically Signed   By: Lovey Newcomer M.D.   On: 09/21/2016 11:06   Dg Knee Complete 4 Views Left  Result Date: 09/21/2016 CLINICAL DATA:  Patient fell and is now having discomfort in her left knee. EXAM: LEFT KNEE - COMPLETE 4+ VIEW COMPARISON:  None. FINDINGS: Components of left knee arthroplasty project in expected location. Negative for fracture or dislocation. Regional soft tissues unremarkable. IMPRESSION: 1. Left knee arthroplasty.  No acute findings. Electronically Signed   By: Lucrezia Europe M.D.   On: 09/21/2016 11:10   Ct Maxillofacial Wo Contrast  Result Date: 09/21/2016 CLINICAL DATA:  Syncope.  Closed head injury. EXAM: CT HEAD WITHOUT CONTRAST CT MAXILLOFACIAL WITHOUT CONTRAST CT CERVICAL SPINE WITHOUT CONTRAST TECHNIQUE: Multidetector CT imaging of the head, cervical spine, and maxillofacial structures were performed using the standard protocol without intravenous contrast. Multiplanar CT image reconstructions of the cervical spine and maxillofacial structures were also generated. COMPARISON:  None. FINDINGS: CT HEAD FINDINGS Brain: No evidence of acute infarction, hemorrhage, hydrocephalus, extra-axial collection or mass lesion/mass effect. Mild patchy areas of low attenuation are identified within the subcortical and periventricular white matter compatible with chronic small vessel ischemic change. Vascular: No hyperdense vessel or unexpected calcification. Skull: Normal. Negative for fracture or focal lesion. Other: None. CT MAXILLOFACIAL FINDINGS Osseous: No fracture or mandibular dislocation. No destructive process. Orbits: Negative. No traumatic or inflammatory finding. Sinuses: Very mild mucosal thickening involving the dependent portion  of the left maxillary sinus, image 34 of series 18. Soft tissues: Subcutaneous soft tissue stranding along the left side of face is identified compatible with contusion. CT CERVICAL SPINE FINDINGS Alignment: There is normal alignment of the cervical spine. Skull base and vertebrae: No acute fracture. No primary bone lesion or focal pathologic process. Soft tissues and spinal canal: No prevertebral fluid or swelling. No visible canal hematoma. Disc levels: Multi level disc space narrowing and ventral endplate spurring is identified. This is most advanced at C6-7. Upper chest: Negative. Other: None IMPRESSION: 1. No acute intracranial abnormalities. Small vessel ischemic change. 2. Left facial contusion. 3. No evidence for facial bone fracture 4. No evidence for cervical spine fracture. 5. Cervical degenerative disc disease. Electronically Signed   By: Kerby Moors M.D.   On: 09/21/2016 11:13    Review of Systems  Constitutional: Positive for malaise/fatigue. Negative for chills, fever and weight loss.  HENT: Negative for hearing loss and tinnitus.   Eyes: Negative for blurred vision and double vision.  Respiratory: Negative for cough and hemoptysis.   Cardiovascular: Negative for chest pain and palpitations.  Gastrointestinal: Positive for abdominal pain and blood in stool. Negative for constipation, diarrhea, heartburn, melena, nausea and vomiting.  Genitourinary: Negative for dysuria, hematuria and urgency.  Musculoskeletal: Negative for myalgias and neck pain.  Skin: Negative  for itching and rash.  Neurological: Positive for weakness. Negative for dizziness and headaches.  Endo/Heme/Allergies: Negative for polydipsia. Does not bruise/bleed easily.  Psychiatric/Behavioral: Negative for depression and suicidal ideas.   Blood pressure (!) 153/48, pulse 65, temperature 97.9 F (36.6 C), resp. rate 18, height 5' 2"  (1.575 m), weight 207 lb 8 oz (94.1 kg), SpO2 99 %. Physical Exam  Nursing note and  vitals reviewed. Constitutional: She is oriented to person, place, and time. She appears well-developed and well-nourished. No distress.  HENT:  Head: Normocephalic and atraumatic.  Mouth/Throat: No oropharyngeal exudate.  Eyes: Conjunctivae are normal. No scleral icterus.  Neck: Neck supple. No thyromegaly present.  Cardiovascular: Normal rate and regular rhythm.   Respiratory: Effort normal and breath sounds normal.  GI: Soft. Bowel sounds are normal. She exhibits no distension and no mass. There is no tenderness. There is no rebound and no guarding.  I am unable to reproduce her abdominal pain on exam  Musculoskeletal: Normal range of motion. She exhibits no edema.  Lymphadenopathy:       Right: No inguinal adenopathy present.       Left: No inguinal adenopathy present.  No umbilical lymphadenopathy  Neurological: She is alert and oriented to person, place, and time.  Skin: Skin is dry. No rash noted.  Psychiatric: She has a normal mood and affect. Thought content normal.    Assessment/Plan: Acute on chronic iron deficiency anemia with FOBT+ stools    - EGD/colon/CE nondiagnostic in 2015/2016 with Dr. Vira [name]    - requires IV infusion every 3 months Melena x weeks Lower abdominal pain  Daily ASA and Plavix CAD CHF Chronic kidney disease Diabetes History of colon polyps    - 4 tubular adenomas removed 2015  Recent melena and atypical lower abdominal pain. Hemoglobin 7.9 and it appears her baseline is 10 based on April 2018 labs. It seems appropriate to further evaluation her acute on chronic iron deficiency with hemoccult positive stools after a Plavix washout. We discussed the differential of both upper and lower sources (PUD, AVMs, Cameron's ulcers, gastritis, esophagitis, colitis, polyps, and malignancy) as well as strategies of (1) continue with iron supplements without endosopy given her advanced age, (2) EGD followed by colonoscopy only if EGD is nondiagnostic, (3) EGD and  colonoscopy concurrently after a Plavix washout later this week. She is currently leaning towards a staged evaluation, but, will discuss these options with her family.  I consented the patient at the bedside today discussing the risks, benefits, and alternatives to endoscopic evaluation. In particular, we discussed the risks that include, but are not limited to, reaction to medication, cardiopulmonary compromise, bleeding requiring blood transfusion, aspiration resulting in pneumonia, perforation requiring surgery, and even death. She acknowledges these risks and asks that we proceed.  Thank you for allowing me to participate in Amy [name]'s care. Dr. Vicente Males will see her tomorrow. Please call with any questions or concerns in the meantime.    Thornton Park 09/21/2016, 6:10 PM

## 2016-09-21 NOTE — Telephone Encounter (Signed)
Shelf Dr. Rockey Situ with history of hypertension, chronic diastolic heart failure, history of paroxysmal atrial fibrillation and CAD currently on aspirin and Plavix. Paged by the patient's daughter who is currently in [place] and patient in [place] to report a fall this morning several hours ago. Paged received around 8:31 AM EST, I returned the paged within 1 min. Per daughter, the exact time of the fall is unclear. However patient is concerned that she may have had a stroke. According to the daughter, patient complained of leg weakness which is new for her. Her niece is trying to take her to the hospital by private car, I recommended contact EMS instead. Apparently, when she fell, she knocked over the phone stand and was unable to reach her medical bracelet either. She eventually was able to call her family after several hours. The timing of the event is unclear. Daughter is unable to provide me weight she was last seen normal.   Again, with possible concern of stroke and a history of paroxysmal atrial fibrillation, I recommended going to the nearest ED via EMS. Note, patient is not on any systemic anticoagulation therapy. She is however on dual antiplatelet therapy including aspirin and Plavix.  Hilbert Corrigan PA Pager: [phone]

## 2016-09-21 NOTE — ED Triage Notes (Signed)
Pt to ED by EMS after a fall while getting out of bed this morning. Pt unsure if LOC but does not remember hitting head. Pt has hematoma to left side of face, above left eye and forehead. Pt has c/o of dizziness and feeling like she has "cobwebs" in her head. Pt also states that prior to getting up she couldn't feel or move her legs and continues to have c/o of her legs feeling "weak and shaky" at this time. BP upon EMS arrival was 98/54 and 141/43 upon arrival to ED.

## 2016-09-21 NOTE — ED Notes (Signed)
Admission MD at bedside.  

## 2016-09-21 NOTE — ED Notes (Signed)
Blood bank called to inform us that blood is ready for the patient.

## 2016-09-21 NOTE — H&P (Signed)
Amy [name] is an 83 y.o. female.   Chief Complaint: Falling. HPI: This 83 year old female who lives at home by herself. Her today she could not hardly get up out of bed she fell out of bed. She pressed her alert button but it did not work. She crawled to the door and finally was able to flex somebody down on her porch. Presentation the ER she had low blood pressure which is since been corrected with IV fluids. She is also found to be anemic and required posture. The patient said she's had some black looking stools of late. She also says she had a history of an ulcer a long time ago. She is currently on aspirin and Plavix.  Past Medical History:  Diagnosis Date  . Bladder incontinence   . CAD (coronary artery disease)    a. cardiac cath 05/2009: proximal LAD 90% stenosis s/p PCI/Xience 2.75 x 12 mm DES, mid LAD 40%, diagonal 40%, proximal LCx 40% followed by 40% LCx lesion, 30%-40%-30% lesion noted in the RCA with distal RCA 30% and 25% lesions  . Chronic diastolic (congestive) heart failure ([place])    a. Lexiscan 2013: no ischemia, normal EF b. echo 09/2015: EF 55-60% w/ Grade 1 DD  . CKD (chronic kidney disease) stage 3, GFR 30-59 ml/min   . Degenerative arthritis of knee    bilateral knees  . Diabetes mellitus    Type II  . Hiatal hernia   . Hypertension   . Iron deficiency   . Menopausal symptoms   . Morbid obesity ([place])   . Thyroid disease    hypothyroidism    Past Surgical History:  Procedure Laterality Date  . COLONOSCOPY  2015  . REPLACEMENT TOTAL KNEE BILATERAL    . TOTAL VAGINAL HYSTERECTOMY     ovarian mass, not cancerous  . UPPER GI ENDOSCOPY  2015    Family History  Problem Relation Age of Onset  . Breast cancer Mother 38   Social History:  reports that she has never smoked. She has never used smokeless tobacco. She reports that she does not drink alcohol or use drugs.  Allergies:  Allergies  Allergen Reactions  . Morphine And Related Shortness Of Breath   . Atenolol     Other reaction(s): Other (See Comments) Decreased heart rate  . Codeine     Rash, difficulty breathing, nausea.  . Nsaids     Other reaction(s): Unknown  . Rofecoxib     Other reaction(s): Unknown  . Sulfa Antibiotics     Other reaction(s): Unknown     (Not in a hospital admission)  Results for orders placed or performed during the hospital encounter of 09/21/16 (from the past 48 hour(s))  Troponin I     Status: None   Collection Time: 09/21/16 10:25 AM  Result Value Ref Range   Troponin I <0.03 <0.03 ng/mL  CBC with Differential     Status: Abnormal   Collection Time: 09/21/16 10:25 AM  Result Value Ref Range   WBC 11.0 3.6 - 11.0 K/uL   RBC 2.96 (L) 3.80 - 5.20 MIL/uL   Hemoglobin 7.9 (L) 12.0 - 16.0 g/dL   HCT 24.6 (L) 35.0 - 47.0 %   MCV 83.1 80.0 - 100.0 fL   MCH 26.7 26.0 - 34.0 pg   MCHC 32.2 32.0 - 36.0 g/dL   RDW 16.1 (H) 11.5 - 14.5 %   Platelets 285 150 - 440 K/uL   Neutrophils Relative % 76 %  Neutro Abs 8.4 (H) 1.4 - 6.5 K/uL   Lymphocytes Relative 13 %   Lymphs Abs 1.5 1.0 - 3.6 K/uL   Monocytes Relative 9 %   Monocytes Absolute 1.0 (H) 0.2 - 0.9 K/uL   Eosinophils Relative 1 %   Eosinophils Absolute 0.1 0 - 0.7 K/uL   Basophils Relative 1 %   Basophils Absolute 0.1 0 - 0.1 K/uL  Basic metabolic panel     Status: Abnormal   Collection Time: 09/21/16 10:25 AM  Result Value Ref Range   Sodium 141 135 - 145 mmol/L   Potassium 5.8 (H) 3.5 - 5.1 mmol/L   Chloride 107 101 - 111 mmol/L   CO2 25 22 - 32 mmol/L   Glucose, Bld 75 65 - 99 mg/dL   BUN 42 (H) 6 - 20 mg/dL   Creatinine, Ser 1.78 (H) 0.44 - 1.00 mg/dL   Calcium 9.1 8.9 - 10.3 mg/dL   GFR calc non Af Amer 24 (L) >60 mL/min   GFR calc Af Amer 28 (L) >60 mL/min    Comment: (NOTE) The eGFR has been calculated using the CKD EPI equation. This calculation has not been validated in all clinical situations. eGFR's persistently <60 mL/min signify possible Chronic Kidney Disease.     Anion gap 9 5 - 15   Ct Head Wo Contrast  Result Date: 09/21/2016 CLINICAL DATA:  Syncope.  Closed head injury. EXAM: CT HEAD WITHOUT CONTRAST CT MAXILLOFACIAL WITHOUT CONTRAST CT CERVICAL SPINE WITHOUT CONTRAST TECHNIQUE: Multidetector CT imaging of the head, cervical spine, and maxillofacial structures were performed using the standard protocol without intravenous contrast. Multiplanar CT image reconstructions of the cervical spine and maxillofacial structures were also generated. COMPARISON:  None. FINDINGS: CT HEAD FINDINGS Brain: No evidence of acute infarction, hemorrhage, hydrocephalus, extra-axial collection or mass lesion/mass effect. Mild patchy areas of low attenuation are identified within the subcortical and periventricular white matter compatible with chronic small vessel ischemic change. Vascular: No hyperdense vessel or unexpected calcification. Skull: Normal. Negative for fracture or focal lesion. Other: None. CT MAXILLOFACIAL FINDINGS Osseous: No fracture or mandibular dislocation. No destructive process. Orbits: Negative. No traumatic or inflammatory finding. Sinuses: Very mild mucosal thickening involving the dependent portion of the left maxillary sinus, image 34 of series 18. Soft tissues: Subcutaneous soft tissue stranding along the left side of face is identified compatible with contusion. CT CERVICAL SPINE FINDINGS Alignment: There is normal alignment of the cervical spine. Skull base and vertebrae: No acute fracture. No primary bone lesion or focal pathologic process. Soft tissues and spinal canal: No prevertebral fluid or swelling. No visible canal hematoma. Disc levels: Multi level disc space narrowing and ventral endplate spurring is identified. This is most advanced at C6-7. Upper chest: Negative. Other: None IMPRESSION: 1. No acute intracranial abnormalities. Small vessel ischemic change. 2. Left facial contusion. 3. No evidence for facial bone fracture 4. No evidence for  cervical spine fracture. 5. Cervical degenerative disc disease. Electronically Signed   By: Kerby Moors M.D.   On: 09/21/2016 11:13   Ct Cervical Spine Wo Contrast  Result Date: 09/21/2016 CLINICAL DATA:  Syncope.  Closed head injury. EXAM: CT HEAD WITHOUT CONTRAST CT MAXILLOFACIAL WITHOUT CONTRAST CT CERVICAL SPINE WITHOUT CONTRAST TECHNIQUE: Multidetector CT imaging of the head, cervical spine, and maxillofacial structures were performed using the standard protocol without intravenous contrast. Multiplanar CT image reconstructions of the cervical spine and maxillofacial structures were also generated. COMPARISON:  None. FINDINGS: CT HEAD FINDINGS Brain: No evidence of  acute infarction, hemorrhage, hydrocephalus, extra-axial collection or mass lesion/mass effect. Mild patchy areas of low attenuation are identified within the subcortical and periventricular white matter compatible with chronic small vessel ischemic change. Vascular: No hyperdense vessel or unexpected calcification. Skull: Normal. Negative for fracture or focal lesion. Other: None. CT MAXILLOFACIAL FINDINGS Osseous: No fracture or mandibular dislocation. No destructive process. Orbits: Negative. No traumatic or inflammatory finding. Sinuses: Very mild mucosal thickening involving the dependent portion of the left maxillary sinus, image 34 of series 18. Soft tissues: Subcutaneous soft tissue stranding along the left side of face is identified compatible with contusion. CT CERVICAL SPINE FINDINGS Alignment: There is normal alignment of the cervical spine. Skull base and vertebrae: No acute fracture. No primary bone lesion or focal pathologic process. Soft tissues and spinal canal: No prevertebral fluid or swelling. No visible canal hematoma. Disc levels: Multi level disc space narrowing and ventral endplate spurring is identified. This is most advanced at C6-7. Upper chest: Negative. Other: None IMPRESSION: 1. No acute intracranial  abnormalities. Small vessel ischemic change. 2. Left facial contusion. 3. No evidence for facial bone fracture 4. No evidence for cervical spine fracture. 5. Cervical degenerative disc disease. Electronically Signed   By: Kerby Moors M.D.   On: 09/21/2016 11:13   Dg Chest Port 1 View  Result Date: 09/21/2016 CLINICAL DATA:  Patient with weakness. EXAM: PORTABLE CHEST 1 VIEW COMPARISON:  Chest radiograph 10/08/2015 FINDINGS: Monitoring leads overlie the patient. Stable cardiomegaly. Calcification of the thoracic aorta. No consolidative pulmonary opacities. No pleural effusion or pneumothorax. IMPRESSION: Cardiomegaly.  No acute cardiopulmonary process. Electronically Signed   By: Lovey Newcomer M.D.   On: 09/21/2016 11:06   Dg Knee Complete 4 Views Left  Result Date: 09/21/2016 CLINICAL DATA:  Patient fell and is now having discomfort in her left knee. EXAM: LEFT KNEE - COMPLETE 4+ VIEW COMPARISON:  None. FINDINGS: Components of left knee arthroplasty project in expected location. Negative for fracture or dislocation. Regional soft tissues unremarkable. IMPRESSION: 1. Left knee arthroplasty.  No acute findings. Electronically Signed   By: Lucrezia Europe M.D.   On: 09/21/2016 11:10   Ct Maxillofacial Wo Contrast  Result Date: 09/21/2016 CLINICAL DATA:  Syncope.  Closed head injury. EXAM: CT HEAD WITHOUT CONTRAST CT MAXILLOFACIAL WITHOUT CONTRAST CT CERVICAL SPINE WITHOUT CONTRAST TECHNIQUE: Multidetector CT imaging of the head, cervical spine, and maxillofacial structures were performed using the standard protocol without intravenous contrast. Multiplanar CT image reconstructions of the cervical spine and maxillofacial structures were also generated. COMPARISON:  None. FINDINGS: CT HEAD FINDINGS Brain: No evidence of acute infarction, hemorrhage, hydrocephalus, extra-axial collection or mass lesion/mass effect. Mild patchy areas of low attenuation are identified within the subcortical and periventricular white  matter compatible with chronic small vessel ischemic change. Vascular: No hyperdense vessel or unexpected calcification. Skull: Normal. Negative for fracture or focal lesion. Other: None. CT MAXILLOFACIAL FINDINGS Osseous: No fracture or mandibular dislocation. No destructive process. Orbits: Negative. No traumatic or inflammatory finding. Sinuses: Very mild mucosal thickening involving the dependent portion of the left maxillary sinus, image 34 of series 18. Soft tissues: Subcutaneous soft tissue stranding along the left side of face is identified compatible with contusion. CT CERVICAL SPINE FINDINGS Alignment: There is normal alignment of the cervical spine. Skull base and vertebrae: No acute fracture. No primary bone lesion or focal pathologic process. Soft tissues and spinal canal: No prevertebral fluid or swelling. No visible canal hematoma. Disc levels: Multi level disc space narrowing and ventral  endplate spurring is identified. This is most advanced at C6-7. Upper chest: Negative. Other: None IMPRESSION: 1. No acute intracranial abnormalities. Small vessel ischemic change. 2. Left facial contusion. 3. No evidence for facial bone fracture 4. No evidence for cervical spine fracture. 5. Cervical degenerative disc disease. Electronically Signed   By: Kerby Moors M.D.   On: 09/21/2016 11:13    Review of Systems  Constitutional: Negative for chills and fever.  HENT: Negative for hearing loss.   Eyes: Negative for blurred vision.  Respiratory: Negative for shortness of breath.   Cardiovascular: Negative for chest pain.  Gastrointestinal: Positive for blood in stool.  Genitourinary: Negative for dysuria.  Musculoskeletal: Negative for myalgias.  Skin: Negative for rash.  Neurological: Negative for dizziness.  Endo/Heme/Allergies: Does not bruise/bleed easily.    Blood pressure (!) 138/38, pulse 62, temperature 97.6 F (36.4 C), temperature source Oral, resp. rate 20, height 5' 2"  (1.575 m),  weight 96.2 kg (212 lb), SpO2 100 %. Physical Exam  Constitutional: She is oriented to person, place, and time. She appears well-developed and well-nourished. No distress.  HENT:  Head: Normocephalic and atraumatic.  Mouth/Throat: Oropharynx is clear and moist. No oropharyngeal exudate.  Eyes: Pupils are equal, round, and reactive to light. Conjunctivae are normal. No scleral icterus.  Neck: Neck supple. No JVD present. No tracheal deviation present. No thyromegaly present.  Cardiovascular: Normal rate and regular rhythm.   No murmur heard. Respiratory: Effort normal and breath sounds normal. No respiratory distress.  GI: Soft. Bowel sounds are normal. She exhibits distension. There is no guarding.  Musculoskeletal: Normal range of motion. She exhibits no edema or tenderness.  Lymphadenopathy:    She has no cervical adenopathy.  Neurological: She is alert and oriented to person, place, and time. No cranial nerve deficit.  Skin: Skin is warm and dry.     Assessment/Plan 1. GI bleeding. Suspect upper GI bleed probably from aspirin use. We'll go ahead and hold her aspirin and Plavix for now. We'll start her on IV Protonix. Consult GI for possible endoscopy. 2. Acute blood loss anemia. Hemoglobin is down from last recorded check. Suspect GI blood loss. She also has a history of iron deficiency anemia and receives iron transfusion every month or so. May need to replete her on some iron stores before discharge. Currently she is receiving 2 units packed red blood cells. She does have a history of congestive heart failure so go ahead and give her Lasix in between the units. 3. Hyperkalemia. Patient has received some IV fluid and insulin here in the ER. We'll go ahead and recheck her potassium later today to make sure his trending down. We'll monitor on telemetry during that period 4. Acute on chronic renal failure. She appears to have stage III chronic renal failure. Creatinine is up today. Probably  hypoperfusion from the blood loss. We'll recheck renal function in the morning. 5. History of CHF. What to watch fluid balance carefully. I am holding her regular dose of Lasix for now because of possible hypovolemia. Next line total time spent 45 minutes  [name], MD 09/21/2016, 12:45 PM

## 2016-09-21 NOTE — ED Notes (Signed)
Patient transported to CT 

## 2016-09-21 NOTE — ED Notes (Signed)
MD McShane at bedside

## 2016-09-21 NOTE — ED Provider Notes (Addendum)
[facility] Emergency Department Provider Note  ____________________________________________   I have reviewed the triage vital signs and the nursing notes.   HISTORY  Chief Complaint Dizziness    HPI Amy [name] is a 83 y.o. female on multiple blood pressure medications, Plavix, aspirin, with multiple medical problems, states that she woke up this morning and felt that she couldn't move her entire body. She felt generally weak. She tried to roll out of bed and fell hitting her head. She thinks she may have passed out briefly. She was on the floor for some time because her life alert was not working because she accidentally knocked the phone off its cradle when she fell. She was able to crawl and get up ultimately. Blood pressure was low for EMS. Has resolved since then. She has had low blood pressure and feeling weak over the last week. When she went to cardiac rehabilitation a week ago was found that her diastolic pressures were very low. No change in her medications recently. Denies dysuria or urinary frequency. She denies severe headache. She states she did hit her head. She has minimal neck discomfort. She has bruising to the left side of her face. She also has some discomfort in her left knee. She states she felt weak and could not get up and also she has bilateral knee replacements and it was difficult for her to get up for that reason as well.     Past Medical History:  Diagnosis Date  . Bladder incontinence   . CAD (coronary artery disease)    a. cardiac cath 05/2009: proximal LAD 90% stenosis s/p PCI/Xience 2.75 x 12 mm DES, mid LAD 40%, diagonal 40%, proximal LCx 40% followed by 40% LCx lesion, 30%-40%-30% lesion noted in the RCA with distal RCA 30% and 25% lesions  . Chronic diastolic (congestive) heart failure ([place])    a. Lexiscan 2013: no ischemia, normal EF b. echo 09/2015: EF 55-60% w/ Grade 1 DD  . CKD (chronic kidney disease) stage 3, GFR  30-59 ml/min   . Degenerative arthritis of knee    bilateral knees  . Diabetes mellitus    Type II  . Hiatal hernia   . Hypertension   . Iron deficiency   . Menopausal symptoms   . Morbid obesity ([place])   . Thyroid disease    hypothyroidism    Patient Active Problem List   Diagnosis Date Noted  . Vitamin D deficiency 11/04/2015  . Acute on chronic diastolic congestive heart failure ([place])   . Multiple lung nodules   . Acute bronchitis   . Bronchitis with obstruction ([place])   . New onset atrial fibrillation ([place]) 10/04/2015  . Anemia 10/04/2015  . COPD exacerbation ([place]) 10/04/2015  . Acute renal failure superimposed on stage 3 chronic kidney disease ([place]) 10/03/2015  . Acute on chronic diastolic (congestive) heart failure ([place]) 10/01/2015  . Advanced directives, counseling/discussion 06/05/2015  . Morbid obesity ([place]) 12/05/2014  . Angina pectoris associated with type 2 diabetes mellitus ([place]) 12/05/2014  . Diabetes type 2, uncontrolled ([place]) 12/05/2014  . Chronic diastolic CHF (congestive heart failure) ([place]) 09/06/2013  . Preop cardiovascular exam 06/03/2012  . Iron deficiency anemia 09/08/2011  . DYSPNEA 01/21/2010  . Hyperlipidemia 07/18/2009  . CAD, NATIVE VESSEL 07/18/2009  . HYPOTHYROIDISM-IATROGENIC 07/12/2009  . Essential hypertension 07/12/2009    Past Surgical History:  Procedure Laterality Date  . COLONOSCOPY  2015  . REPLACEMENT TOTAL KNEE BILATERAL    . TOTAL VAGINAL HYSTERECTOMY  ovarian mass, not cancerous  . UPPER GI ENDOSCOPY  2015    Prior to Admission medications   Medication Sig Start Date End Date Taking? Authorizing Provider  aspirin 81 MG tablet Take 81 mg by mouth daily.    [provider]  cloNIDine (CATAPRES - DOSED IN MG/24 HR) 0.3 mg/24hr patch Place 0.3 mg onto the skin once a week.  12/11/14   [provider]  cloNIDine (CATAPRES) 0.1 MG tablet Take 1 tablet (0.1 mg total) by mouth 2 (two) times daily as needed.  Take for pressure >160 09/03/16   Minna Merritts, MD  clopidogrel (PLAVIX) 75 MG tablet Take 1 tablet (75 mg total) by mouth daily. 07/20/13   Wellington Hampshire, MD  doxazosin (CARDURA) 8 MG tablet Take 1 tablet (8 mg total) by mouth 2 (two) times daily. 11/14/15   Minna Merritts, MD  famotidine (PEPCID) 20 MG tablet Take 1 tablet (20 mg total) by mouth 2 (two) times daily. 11/14/15 11/13/16  Laverle Hobby, MD  fluticasone (FLONASE) 50 MCG/ACT nasal spray Place into the nose. 04/10/15 08/18/16  [provider]  furosemide (LASIX) 20 MG tablet Take 1 tablet (20 mg total) by mouth daily as needed. 06/02/16 08/31/16  Minna Merritts, MD  glimepiride (AMARYL) 4 MG tablet Take 4 mg by mouth daily before breakfast.    [provider]  ipratropium-albuterol (DUONEB) 0.5-2.5 (3) MG/3ML SOLN Take 3 mLs by nebulization every 4 (four) hours as needed (shortness of breath/wheezing.). 10/11/15   Henreitta Leber, MD  levothyroxine (SYNTHROID, LEVOTHROID) 125 MCG tablet Take 125 mcg by mouth daily.    [provider]  losartan-hydrochlorothiazide (HYZAAR) 100-25 MG per tablet Take 1 tablet by mouth daily.      [provider]  metFORMIN (GLUCOPHAGE) 1000 MG tablet Take 1,000 mg by mouth 2 (two) times daily with a meal.    [provider]  Multiple Vitamins-Minerals (PRESERVISION AREDS PO) Take by mouth 2 (two) times daily.    [provider]  oxybutynin (DITROPAN) 5 MG tablet Take 5 mg by mouth 1 dose over 46 hours.      [provider]  pantoprazole (PROTONIX) 40 MG tablet Take 40 mg by mouth daily.  08/22/14   [provider]  senna (SENOKOT) 8.6 MG TABS tablet Take 1 tablet (8.6 mg total) by mouth daily as needed for mild constipation. 10/11/15   Henreitta Leber, MD  simvastatin (ZOCOR) 40 MG tablet Take 1 tablet (40 mg total) by mouth at bedtime. 08/22/16   Minna Merritts, MD  SYMBICORT 160-4.5 MCG/ACT inhaler Inhale 2 puffs into the  lungs 2 (two) times daily.  11/15/14   [provider]  vitamin B-12 (CYANOCOBALAMIN) 1000 MCG tablet Take 1 tablet by mouth daily. 06/27/16 06/27/17  [provider]    Allergies Morphine and related; Atenolol; Codeine; Nsaids; Rofecoxib; and Sulfa antibiotics  Family History  Problem Relation Age of Onset  . Breast cancer Mother 57    Social History Social History  Substance Use Topics  . Smoking status: Never Smoker  . Smokeless tobacco: Never Used  . Alcohol use No    Review of Systems Constitutional: No fever/chills Eyes: No visual changes. ENT: No sore throat. No stiff neck no neck pain Cardiovascular: Denies chest pain. Respiratory: Denies shortness of breath. Gastrointestinal:   no vomiting.  No diarrhea.  No constipation. Genitourinary: Negative for dysuria. Musculoskeletal: Negative lower extremity swelling Skin: Negative for rash. Neurological: Negative for  severe headaches, focal weakness or numbness.   ____________________________________________   PHYSICAL EXAM:  VITAL SIGNS: ED Triage Vitals  Enc Vitals Group     BP 09/21/16 0938 (!) 141/43     Pulse Rate 09/21/16 0938 (!) 58     Resp 09/21/16 0938 20     Temp 09/21/16 0942 97.6 F (36.4 C)     Temp Source 09/21/16 0942 Oral     SpO2 09/21/16 0938 100 %     Weight 09/21/16 0943 212 lb (96.2 kg)     Height 09/21/16 0943 5\' 2"  (1.575 m)     Head Circumference --      Peak Flow --      Pain Score 09/21/16 0942 5     Pain Loc --      Pain Edu? --      Excl. in [place]? --     Constitutional: Alert and oriented. Well appearing and in no acute distress. Eyes: Conjunctivae are normal Head:  + bruising to L cheek and L jaw HEENT: No congestion/rhinnorhea. Mucous membranes are moist.  Oropharynx non-erythematous Neck:   Slight tenderness to palpation of paraspinal muscles no midline tendernesswith no meningismus, no masses, no stridor Cardiovascular: Normal rate, regular rhythm. Grossly  normal heart sounds.  Good peripheral circulation. Respiratory: Normal respiratory effort.  No retractions. Lungs CTAB. Abdominal: Soft and nontender. No distention. No guarding no rebound Back:  There is no focal tenderness or step off.  there is no midline tenderness there are no lesions noted. there is no CVA tenderness Musculoskeletal:  + redness from crawling on the L knee. no upper extremity tenderness. No joint effusions, no DVT signs strong distal pulses no edema Neurologic:  Normal speech and language. No gross focal neurologic deficits are appreciated.  Skin:  Skin is warm, dry and intact. No rash noted. Psychiatric: Mood and affect are normal. Speech and behavior are normal.  ____________________________________________   LABS (all labs ordered are listed, but only abnormal results are displayed)  Labs Reviewed  URINALYSIS, COMPLETE (UACMP) WITH MICROSCOPIC  TROPONIN I  CBC WITH DIFFERENTIAL/PLATELET  BASIC METABOLIC PANEL  PROTIME-INR   ____________________________________________  EKG  I personally interpreted any EKGs ordered by me or triage Sinus rhythm rate 59 bpm, mild bradycardia noted, right bundle-branch block noted. Known severe ST changes normal axis ____________________________________________  RADIOLOGY  I reviewed any imaging ordered by me or triage that were performed during my shift and, if possible, patient and/or family made aware of any abnormal findings. ____________________________________________   PROCEDURES  Procedure(s) performed: None  Procedures  Critical Care performed: CRITICAL CARE Performed by: Schuyler Amor   Total critical care time: 40 minutes  Critical care time was exclusive of separately billable procedures and treating other patients.  Critical care was necessary to treat or prevent imminent or life-threatening deterioration.  Critical care was time spent personally by me on the following activities: development of  treatment plan with patient and/or surrogate as well as nursing, discussions with consultants, evaluation of patient's response to treatment, examination of patient, obtaining history from patient or surrogate, ordering and performing treatments and interventions, ordering and review of laboratory studies, ordering and review of radiographic studies, pulse oximetry and re-evaluation of patient's condition.   ____________________________________________   INITIAL IMPRESSION / ASSESSMENT AND PLAN / ED COURSE  Pertinent labs & imaging results that were available during my care of the patient were reviewed by me and considered in my medical decision making (see chart for details).  Patient here with a fall. She likely had low blood pressure when she woke up and rolled out of bed because she was too weak. No focal weakness. There are multiple bruises on her face, certainly not impossible she has a head bleed we will obtain CT head and neck and face. She has very slight tenderness to the paraspinal muscles of the neck.we are going to check urinalysis cardiac enzymes and blood work, I suspect her week of low blood pressure and lightheadedness is secondary to her blood pressure medications but will obviously look for other sources. Does not appear to be infected at this time. Abdomen is benign. Lungs sound clear. We will obtain a chest x-ray nonetheless.  ----------------------------------------- 11:14 AM on 09/21/2016 -----------------------------------------  Patient noted to be significantly anemic, she, when reminded, remembers [place] that she's been having black stool for several days. She is guaiac positive black stool from below. Female nurse chaperone present. We'll send a type and screen. Patient will require transfusion. Does not appear to be a brisk bleed. We are waiting CT findings, she has no complaint of this time. Potassium is mildly elevated, we will see if IV fluid health. BUN is also  elevated as well as creatinine is mild that with IV fluid we can help bring down her potassium. I don't think there is any indication for more extreme measures for potassium of 5.8 patient does consent to blood transfusion.  ____________________________________________   FINAL CLINICAL IMPRESSION(S) / ED DIAGNOSES  Final diagnoses:  Fall      This chart was dictated using voice recognition software.  Despite best efforts to proofread,  errors can occur which can change meaning.      Schuyler Amor, MD 09/21/16 1008    Schuyler Amor, MD 09/21/16 1115    Schuyler Amor, MD 09/21/16 1120

## 2016-09-22 LAB — CBC
HCT: 28.6 % — ABNORMAL LOW (ref 35.0–47.0)
HEMOGLOBIN: 9.4 g/dL — AB (ref 12.0–16.0)
MCH: 27.8 pg (ref 26.0–34.0)
MCHC: 33 g/dL (ref 32.0–36.0)
MCV: 84.2 fL (ref 80.0–100.0)
Platelets: 225 10*3/uL (ref 150–440)
RBC: 3.4 MIL/uL — ABNORMAL LOW (ref 3.80–5.20)
RDW: 16.5 % — ABNORMAL HIGH (ref 11.5–14.5)
WBC: 7.5 10*3/uL (ref 3.6–11.0)

## 2016-09-22 LAB — BASIC METABOLIC PANEL
ANION GAP: 6 (ref 5–15)
BUN: 42 mg/dL — ABNORMAL HIGH (ref 6–20)
CALCIUM: 9 mg/dL (ref 8.9–10.3)
CO2: 27 mmol/L (ref 22–32)
Chloride: 107 mmol/L (ref 101–111)
Creatinine, Ser: 1.93 mg/dL — ABNORMAL HIGH (ref 0.44–1.00)
GFR calc Af Amer: 26 mL/min — ABNORMAL LOW (ref >60)
GFR calc non Af Amer: 22 mL/min — ABNORMAL LOW (ref >60)
GLUCOSE: 241 mg/dL — AB (ref 65–99)
Potassium: 5 mmol/L (ref 3.5–5.1)
Sodium: 140 mmol/L (ref 135–145)

## 2016-09-22 LAB — TYPE AND SCREEN
ABO/RH(D): O POS
Antibody Screen: NEGATIVE
UNIT DIVISION: 0
Unit division: 0
Unit division: 0

## 2016-09-22 LAB — BPAM RBC
BLOOD PRODUCT EXPIRATION DATE: 201808232359
BLOOD PRODUCT EXPIRATION DATE: 201808232359
BLOOD PRODUCT EXPIRATION DATE: 201808242359
ISSUE DATE / TIME: 201807272320
ISSUE DATE / TIME: 201807291554
ISSUE DATE / TIME: 201807292246
UNIT TYPE AND RH: 5100
Unit Type and Rh: 5100
Unit Type and Rh: 5100

## 2016-09-22 LAB — URINALYSIS, COMPLETE (UACMP) WITH MICROSCOPIC
BACTERIA UA: NONE SEEN
Bilirubin Urine: NEGATIVE
Glucose, UA: NEGATIVE mg/dL
Hgb urine dipstick: NEGATIVE
Ketones, ur: NEGATIVE mg/dL
Leukocytes, UA: NEGATIVE
Nitrite: NEGATIVE
Protein, ur: NEGATIVE mg/dL
SPECIFIC GRAVITY, URINE: 1.01 (ref 1.005–1.030)
pH: 6 (ref 5.0–8.0)

## 2016-09-22 LAB — GLUCOSE, CAPILLARY
GLUCOSE-CAPILLARY: 352 mg/dL — AB (ref 65–99)
Glucose-Capillary: 215 mg/dL — ABNORMAL HIGH (ref 65–99)
Glucose-Capillary: 240 mg/dL — ABNORMAL HIGH (ref 65–99)

## 2016-09-22 MED ORDER — CLONIDINE HCL 0.3 MG/24HR TD PTWK
0.3000 mg | MEDICATED_PATCH | TRANSDERMAL | Status: DC
  Administered 2016-09-22: 0.3 mg via TRANSDERMAL
  Filled 2016-09-22: qty 1

## 2016-09-22 MED ORDER — HYDRALAZINE HCL 20 MG/ML IJ SOLN
10.0000 mg | Freq: Four times a day (QID) | INTRAMUSCULAR | Status: DC | PRN
  Administered 2016-09-22: 10 mg via INTRAVENOUS
  Filled 2016-09-22: qty 1

## 2016-09-22 MED ORDER — INSULIN ASPART 100 UNIT/ML ~~LOC~~ SOLN
0.0000 [IU] | Freq: Three times a day (TID) | SUBCUTANEOUS | Status: DC
  Administered 2016-09-22: 9 [IU] via SUBCUTANEOUS
  Administered 2016-09-22: 3 [IU] via SUBCUTANEOUS
  Administered 2016-09-23: 5 [IU] via SUBCUTANEOUS
  Administered 2016-09-23: 2 [IU] via SUBCUTANEOUS
  Administered 2016-09-23: 3 [IU] via SUBCUTANEOUS
  Administered 2016-09-24 (×2): 2 [IU] via SUBCUTANEOUS
  Administered 2016-09-24 – 2016-09-25 (×2): 1 [IU] via SUBCUTANEOUS
  Administered 2016-09-25: 2 [IU] via SUBCUTANEOUS
  Filled 2016-09-22 (×10): qty 1

## 2016-09-22 NOTE — Telephone Encounter (Signed)
Pt is currently admitted

## 2016-09-22 NOTE — Progress Notes (Signed)
Inpatient Diabetes Program Recommendations  AACE/ADA: New Consensus Statement on Inpatient Glycemic Control (2015)  Target Ranges:  Prepandial:   less than 140 mg/dL      Peak postprandial:   less than 180 mg/dL (1-2 hours)      Critically ill patients:  140 - 180 mg/dL   Lab Results  Component Value Date   GLUCAP 352 (H) 09/22/2016   HGBA1C 5.6 10/01/2015    Review of Glycemic Control:  Results for Amy [name], Amy [name] (MRN [id number]) as of 09/22/2016 11:55  Ref. Range 09/21/2016 17:16 09/22/2016 11:42  Glucose-Capillary Latest Ref Range: 65 - 99 mg/dL 185 (H) 352 (H)    Diabetes history: Type 2 diabetes Outpatient Diabetes medications: Metformin 1000 mg bid, Amaryl 4 mg daily Current orders for Inpatient glycemic control:  Novolog sensitive tid with meals, Amaryl 4 mg with breakfast  Inpatient Diabetes Program Recommendations:   Please consider d/c of Amaryl while patient is in the hospital.  If fasting blood sugars continue> 180 mg/dL, consider adding Levemir 10 units daily.  Thanks, Adah Perl, RN, BC-ADM Inpatient Diabetes Coordinator Pager [phone] (8a-5p)

## 2016-09-22 NOTE — Progress Notes (Signed)
[facility] [place] NAME: Amy [name]    MR#:  [id number]  DATE OF BIRTH:  11/18/1927  SUBJECTIVE:   Patient presents with dark-colored stools  REVIEW OF SYSTEMS:    Review of Systems  Constitutional: Negative for fever, chills weight loss Positive for generalized weakness HENT: Negative for ear pain, nosebleeds, congestion, facial swelling, rhinorrhea, neck pain, neck stiffness and ear discharge.   Respiratory: Negative for cough, shortness of breath, wheezing  Cardiovascular: Negative for chest pain, palpitations and leg swelling.  Gastrointestinal: Negative for heartburn, abdominal pain, vomiting, diarrhea or consitpation Positive melena Genitourinary: Negative for dysuria, urgency, frequency, hematuria Musculoskeletal: Negative for back pain or joint pain Neurological: Negative for dizziness, seizures, syncope, focal weakness,  numbness and headaches.  Hematological: Does not bruise/bleed easily.  Psychiatric/Behavioral: Negative for hallucinations, confusion, dysphoric mood    Tolerating Diet: yes      DRUG ALLERGIES:   Allergies  Allergen Reactions  . Morphine And Related Shortness Of Breath  . Atenolol     Other reaction(s): Other (See Comments) Decreased heart rate  . Codeine     Rash, difficulty breathing, nausea.  . Nsaids     Other reaction(s): Unknown  . Rofecoxib     Other reaction(s): Unknown  . Sulfa Antibiotics     Other reaction(s): Unknown    VITALS:  Blood pressure (!) 130/35, pulse (!) 58, temperature 98.4 F (36.9 C), temperature source Oral, resp. rate 20, height 5\' 2"  (1.575 m), weight 94.1 kg (207 lb 8 oz), SpO2 96 %.  PHYSICAL EXAMINATION:  Constitutional: Appears well-developed and well-nourished. No distress. HENT: Normocephalic. Marland Kitchen Oropharynx is clear and moist.  Eyes: Conjunctivae and EOM are normal. PERRLA, no scleral icterus.  Neck: Normal ROM. Neck supple. No JVD. No tracheal  deviation. CVS: RRR, S1/S2 +, no murmurs, no gallops, no carotid bruit.  Pulmonary: Effort and breath sounds normal, no stridor, rhonchi, wheezes, rales.  Abdominal: Soft. BS +,  no distension, tenderness, rebound or guarding.  Musculoskeletal: Normal range of motion. No edema and no tenderness.  Neuro: Alert. CN 2-12 grossly intact. No focal deficits. Skin: Skin is warm and dry. No rash noted. Psychiatric: Normal mood and affect.      LABORATORY PANEL:   CBC  Recent Labs Lab 09/22/16 0425  WBC 7.5  HGB 9.4*  HCT 28.6*  PLT 225   ------------------------------------------------------------------------------------------------------------------  Chemistries   Recent Labs Lab 09/22/16 0425  NA 140  K 5.0  CL 107  CO2 27  GLUCOSE 241*  BUN 42*  CREATININE 1.93*  CALCIUM 9.0   ------------------------------------------------------------------------------------------------------------------  Cardiac Enzymes  Recent Labs Lab 09/21/16 1025  TROPONINI <0.03   ------------------------------------------------------------------------------------------------------------------  RADIOLOGY:  Ct Head Wo Contrast  Result Date: 09/21/2016 CLINICAL DATA:  Syncope.  Closed head injury. EXAM: CT HEAD WITHOUT CONTRAST CT MAXILLOFACIAL WITHOUT CONTRAST CT CERVICAL SPINE WITHOUT CONTRAST TECHNIQUE: Multidetector CT imaging of the head, cervical spine, and maxillofacial structures were performed using the standard protocol without intravenous contrast. Multiplanar CT image reconstructions of the cervical spine and maxillofacial structures were also generated. COMPARISON:  None. FINDINGS: CT HEAD FINDINGS Brain: No evidence of acute infarction, hemorrhage, hydrocephalus, extra-axial collection or mass lesion/mass effect. Mild patchy areas of low attenuation are identified within the subcortical and periventricular white matter compatible with chronic small vessel ischemic change. Vascular:  No hyperdense vessel or unexpected calcification. Skull: Normal. Negative for fracture or focal lesion. Other: None. CT MAXILLOFACIAL FINDINGS Osseous: No fracture or  mandibular dislocation. No destructive process. Orbits: Negative. No traumatic or inflammatory finding. Sinuses: Very mild mucosal thickening involving the dependent portion of the left maxillary sinus, image 34 of series 18. Soft tissues: Subcutaneous soft tissue stranding along the left side of face is identified compatible with contusion. CT CERVICAL SPINE FINDINGS Alignment: There is normal alignment of the cervical spine. Skull base and vertebrae: No acute fracture. No primary bone lesion or focal pathologic process. Soft tissues and spinal canal: No prevertebral fluid or swelling. No visible canal hematoma. Disc levels: Multi level disc space narrowing and ventral endplate spurring is identified. This is most advanced at C6-7. Upper chest: Negative. Other: None IMPRESSION: 1. No acute intracranial abnormalities. Small vessel ischemic change. 2. Left facial contusion. 3. No evidence for facial bone fracture 4. No evidence for cervical spine fracture. 5. Cervical degenerative disc disease. Electronically Signed   By: Kerby Moors M.D.   On: 09/21/2016 11:13   Ct Cervical Spine Wo Contrast  Result Date: 09/21/2016 CLINICAL DATA:  Syncope.  Closed head injury. EXAM: CT HEAD WITHOUT CONTRAST CT MAXILLOFACIAL WITHOUT CONTRAST CT CERVICAL SPINE WITHOUT CONTRAST TECHNIQUE: Multidetector CT imaging of the head, cervical spine, and maxillofacial structures were performed using the standard protocol without intravenous contrast. Multiplanar CT image reconstructions of the cervical spine and maxillofacial structures were also generated. COMPARISON:  None. FINDINGS: CT HEAD FINDINGS Brain: No evidence of acute infarction, hemorrhage, hydrocephalus, extra-axial collection or mass lesion/mass effect. Mild patchy areas of low attenuation are identified  within the subcortical and periventricular white matter compatible with chronic small vessel ischemic change. Vascular: No hyperdense vessel or unexpected calcification. Skull: Normal. Negative for fracture or focal lesion. Other: None. CT MAXILLOFACIAL FINDINGS Osseous: No fracture or mandibular dislocation. No destructive process. Orbits: Negative. No traumatic or inflammatory finding. Sinuses: Very mild mucosal thickening involving the dependent portion of the left maxillary sinus, image 34 of series 18. Soft tissues: Subcutaneous soft tissue stranding along the left side of face is identified compatible with contusion. CT CERVICAL SPINE FINDINGS Alignment: There is normal alignment of the cervical spine. Skull base and vertebrae: No acute fracture. No primary bone lesion or focal pathologic process. Soft tissues and spinal canal: No prevertebral fluid or swelling. No visible canal hematoma. Disc levels: Multi level disc space narrowing and ventral endplate spurring is identified. This is most advanced at C6-7. Upper chest: Negative. Other: None IMPRESSION: 1. No acute intracranial abnormalities. Small vessel ischemic change. 2. Left facial contusion. 3. No evidence for facial bone fracture 4. No evidence for cervical spine fracture. 5. Cervical degenerative disc disease. Electronically Signed   By: Kerby Moors M.D.   On: 09/21/2016 11:13   Dg Chest Port 1 View  Result Date: 09/21/2016 CLINICAL DATA:  Patient with weakness. EXAM: PORTABLE CHEST 1 VIEW COMPARISON:  Chest radiograph 10/08/2015 FINDINGS: Monitoring leads overlie the patient. Stable cardiomegaly. Calcification of the thoracic aorta. No consolidative pulmonary opacities. No pleural effusion or pneumothorax. IMPRESSION: Cardiomegaly.  No acute cardiopulmonary process. Electronically Signed   By: Lovey Newcomer M.D.   On: 09/21/2016 11:06   Dg Knee Complete 4 Views Left  Result Date: 09/21/2016 CLINICAL DATA:  Patient fell and is now having  discomfort in her left knee. EXAM: LEFT KNEE - COMPLETE 4+ VIEW COMPARISON:  None. FINDINGS: Components of left knee arthroplasty project in expected location. Negative for fracture or dislocation. Regional soft tissues unremarkable. IMPRESSION: 1. Left knee arthroplasty.  No acute findings. Electronically Signed   By: Keturah Barre  Vernard Gambles M.D.   On: 09/21/2016 11:10   Ct Maxillofacial Wo Contrast  Result Date: 09/21/2016 CLINICAL DATA:  Syncope.  Closed head injury. EXAM: CT HEAD WITHOUT CONTRAST CT MAXILLOFACIAL WITHOUT CONTRAST CT CERVICAL SPINE WITHOUT CONTRAST TECHNIQUE: Multidetector CT imaging of the head, cervical spine, and maxillofacial structures were performed using the standard protocol without intravenous contrast. Multiplanar CT image reconstructions of the cervical spine and maxillofacial structures were also generated. COMPARISON:  None. FINDINGS: CT HEAD FINDINGS Brain: No evidence of acute infarction, hemorrhage, hydrocephalus, extra-axial collection or mass lesion/mass effect. Mild patchy areas of low attenuation are identified within the subcortical and periventricular white matter compatible with chronic small vessel ischemic change. Vascular: No hyperdense vessel or unexpected calcification. Skull: Normal. Negative for fracture or focal lesion. Other: None. CT MAXILLOFACIAL FINDINGS Osseous: No fracture or mandibular dislocation. No destructive process. Orbits: Negative. No traumatic or inflammatory finding. Sinuses: Very mild mucosal thickening involving the dependent portion of the left maxillary sinus, image 34 of series 18. Soft tissues: Subcutaneous soft tissue stranding along the left side of face is identified compatible with contusion. CT CERVICAL SPINE FINDINGS Alignment: There is normal alignment of the cervical spine. Skull base and vertebrae: No acute fracture. No primary bone lesion or focal pathologic process. Soft tissues and spinal canal: No prevertebral fluid or swelling. No visible  canal hematoma. Disc levels: Multi level disc space narrowing and ventral endplate spurring is identified. This is most advanced at C6-7. Upper chest: Negative. Other: None IMPRESSION: 1. No acute intracranial abnormalities. Small vessel ischemic change. 2. Left facial contusion. 3. No evidence for facial bone fracture 4. No evidence for cervical spine fracture. 5. Cervical degenerative disc disease. Electronically Signed   By: Kerby Moors M.D.   On: 09/21/2016 11:13     ASSESSMENT AND PLAN:   83 year old female with history of chronic iron deficiency anemia who presents with melena and Hemoccult positive stools on aspirin and Plavix at home.  1. GI bleed with melena: Patient will undergo endoscopy tentatively planned for Thursday Plavix needs to be out of her system for 5 days. Continue PPI Appreciate GI consult  2. Acute on chronic anemia status post 2 units PRBCs Continue to monitor for need for transfusion.   3. Essential hypertension: Continue clonidine,  and Cardura  4. Diabetes: Continue sliding scale, ADA diet and Amaryl  4. Hypothyroid: Continue Synthroid 5. Hyperlipidemia: Continue statin   6. Hyperkalemia: Potassium 5.0 this morning  7. Acute kidney injury on chronic kidney disease stage III: Creatinine slightly increased this morning Hold losartan Repeat BMP in a.m. May need nephrology consultation   8. Chronic diastolic heart failure with preserved ejection fraction: Watch fluid balance  Management plans discussed with the patient and she is in agreement.  CODE STATUS: DNR  TOTAL TIME TAKING CARE OF THIS PATIENT: 30 minutes.     POSSIBLE D/C Friday, DEPENDING ON CLINICAL CONDITION.   Gurfateh Mcclain M.D on 09/22/2016 at 8:04 AM  Between 7am to 6pm - Pager - [phone] After 6pm go to [email or web address] - password [email or web address] [facility]  Office  [phone]  CC: Primary care physician; Leonel Ramsay, MD  Note: This dictation was  prepared with Dragon dictation along with smaller phrase technology. Any transcriptional errors that result from this process are unintentional.

## 2016-09-23 LAB — BASIC METABOLIC PANEL WITH GFR
Anion gap: 8 (ref 5–15)
BUN: 41 mg/dL — ABNORMAL HIGH (ref 6–20)
CO2: 24 mmol/L (ref 22–32)
Calcium: 9.1 mg/dL (ref 8.9–10.3)
Chloride: 108 mmol/L (ref 101–111)
Creatinine, Ser: 1.71 mg/dL — ABNORMAL HIGH (ref 0.44–1.00)
GFR calc Af Amer: 30 mL/min — ABNORMAL LOW (ref >60)
GFR calc non Af Amer: 26 mL/min — ABNORMAL LOW (ref >60)
Glucose, Bld: 176 mg/dL — ABNORMAL HIGH (ref 65–99)
Potassium: 4.8 mmol/L (ref 3.5–5.1)
Sodium: 140 mmol/L (ref 135–145)

## 2016-09-23 LAB — CBC
HEMATOCRIT: 30.6 % — AB (ref 35.0–47.0)
Hemoglobin: 10.1 g/dL — ABNORMAL LOW (ref 12.0–16.0)
MCH: 28.2 pg (ref 26.0–34.0)
MCHC: 33.1 g/dL (ref 32.0–36.0)
MCV: 85.2 fL (ref 80.0–100.0)
Platelets: 244 10*3/uL (ref 150–440)
RBC: 3.59 MIL/uL — ABNORMAL LOW (ref 3.80–5.20)
RDW: 16.2 % — ABNORMAL HIGH (ref 11.5–14.5)
WBC: 8 10*3/uL (ref 3.6–11.0)

## 2016-09-23 LAB — GLUCOSE, CAPILLARY
GLUCOSE-CAPILLARY: 194 mg/dL — AB (ref 65–99)
GLUCOSE-CAPILLARY: 221 mg/dL — AB (ref 65–99)
Glucose-Capillary: 256 mg/dL — ABNORMAL HIGH (ref 65–99)
Glucose-Capillary: 186 mg/dL — ABNORMAL HIGH (ref 65–99)

## 2016-09-23 MED ORDER — HYDRALAZINE HCL 25 MG PO TABS
25.0000 mg | ORAL_TABLET | Freq: Three times a day (TID) | ORAL | Status: DC
  Administered 2016-09-23 – 2016-09-25 (×6): 25 mg via ORAL
  Filled 2016-09-23 (×6): qty 1

## 2016-09-23 MED ORDER — INSULIN DETEMIR 100 UNIT/ML ~~LOC~~ SOLN
10.0000 [IU] | Freq: Every day | SUBCUTANEOUS | Status: DC
  Administered 2016-09-23 – 2016-09-24 (×2): 10 [IU] via SUBCUTANEOUS
  Filled 2016-09-23 (×4): qty 0.1

## 2016-09-23 NOTE — Progress Notes (Signed)
[facility] [place] NAME: Amy [name]    MR#:  [id number]  DATE OF BIRTH:  [birth year]  SUBJECTIVE:   Patient presents with dark-colored stools,Denies any hematemesis decreased frequency of melena  REVIEW OF SYSTEMS:    Review of Systems  Constitutional: Negative for fever, chills weight loss Positive for generalized weakness HENT: Negative for ear pain, nosebleeds, congestion, facial swelling, rhinorrhea, neck pain, neck stiffness and ear discharge.   Respiratory: Negative for cough, shortness of breath, wheezing  Cardiovascular: Negative for chest pain, palpitations and leg swelling.  Gastrointestinal: Negative for heartburn, abdominal pain, vomiting, diarrhea or consitpation Positive melena Genitourinary: Negative for dysuria, urgency, frequency, hematuria Musculoskeletal: Negative for back pain or joint pain Neurological: Negative for dizziness, seizures, syncope, focal weakness,  numbness and headaches.  Hematological: Does not bruise/bleed easily.  Psychiatric/Behavioral: Negative for hallucinations, confusion, dysphoric mood    Tolerating Diet: yes      DRUG ALLERGIES:   Allergies  Allergen Reactions  . Morphine And Related Shortness Of Breath  . Atenolol     Other reaction(s): Other (See Comments) Decreased heart rate  . Codeine     Rash, difficulty breathing, nausea.  . Nsaids     Other reaction(s): Unknown  . Rofecoxib     Other reaction(s): Unknown  . Sulfa Antibiotics     Other reaction(s): Unknown    VITALS:  Blood pressure (!) 158/34, pulse (!) 56, temperature 98 F (36.7 C), temperature source Oral, resp. rate 19, height 5\' 2"  (1.575 m), weight 94.1 kg (207 lb 8 oz), SpO2 96 %.  PHYSICAL EXAMINATION:  Constitutional: Appears well-developed and well-nourished. No distress. HENT: Normocephalic. Marland Kitchen Oropharynx is clear and moist.  Eyes: Conjunctivae and EOM are normal. PERRLA, no scleral icterus.   Neck: Normal ROM. Neck supple. No JVD. No tracheal deviation. CVS: RRR, S1/S2 +, no murmurs, no gallops, no carotid bruit.  Pulmonary: Effort and breath sounds normal, no stridor, rhonchi, wheezes, rales.  Abdominal: Soft. BS +,  no distension, tenderness, rebound or guarding.  Musculoskeletal: Normal range of motion. No edema and no tenderness.  Neuro: Alert. CN 2-12 grossly intact. No focal deficits. Skin: Skin is warm and dry. No rash noted. Psychiatric: Normal mood and affect.      LABORATORY PANEL:   CBC  Recent Labs Lab 09/23/16 0331  WBC 8.0  HGB 10.1*  HCT 30.6*  PLT 244   ------------------------------------------------------------------------------------------------------------------  Chemistries   Recent Labs Lab 09/23/16 0331  NA 140  K 4.8  CL 108  CO2 24  GLUCOSE 176*  BUN 41*  CREATININE 1.71*  CALCIUM 9.1   ------------------------------------------------------------------------------------------------------------------  Cardiac Enzymes  Recent Labs Lab 09/21/16 1025  TROPONINI <0.03   ------------------------------------------------------------------------------------------------------------------  RADIOLOGY:  No results found.   ASSESSMENT AND PLAN:   83 year old female with history of chronic iron deficiency anemia who presents with melena and Hemoccult positive stools on aspirin and Plavix at home.  1. GI bleed with melena:Holding Plavix for a total of 5 days  Continue PPI Appreciate GI consult,Planning EGD and colonoscopy on Thursday Monitor hemoglobin and hematocrit and transfuse as needed. Hemodynamically stable  2. Acute on chronic anemia status post 2 units PRBCs Continue to monitor for need for transfusion.   3. Essential hypertension: Continue clonidine,  and Cardura   4. Diabetes: Continue sliding scale, ADA diet and discontinued Amaryl Levemir 10 units subcutaneous once daily  4. Hypothyroid: Continue  Synthroid  5. Hyperlipidemia: Continue statin   6.  Hyperkalemia: Improved. Potassium at 4.8  7. Acute kidney injury on chronic kidney disease stage III: Creatinine better this morning Hold losartan Repeat BMP in a.m. May need nephrology consultation if no improvement   8. Chronic diastolic heart failure with preserved ejection fraction: Watch fluid balance  9. Hypertension with wide pulse pressure Continue clonidine and hydralazine is added, titrate as needed  Management plans discussed with the patient and she is in agreement.  CODE STATUS: DNR  TOTAL TIME TAKING CARE OF THIS PATIENT: 34 minutes.     POSSIBLE D/C Friday, DEPENDING ON CLINICAL CONDITION.   Nicholes Mango M.D on 09/23/2016 at 1:53 PM  Between 7am to 6pm - Pager - [phone] After 6pm go to [email or web address] - password [email or web address] [facility]  Office  [phone]  CC: Primary care physician; Leonel Ramsay, MD  Note: This dictation was prepared with Dragon dictation along with smaller phrase technology. Any transcriptional errors that result from this process are unintentional.

## 2016-09-24 DIAGNOSIS — K921 Melena: Secondary | ICD-10-CM

## 2016-09-24 LAB — GLUCOSE, CAPILLARY
GLUCOSE-CAPILLARY: 148 mg/dL — AB (ref 65–99)
GLUCOSE-CAPILLARY: 181 mg/dL — AB (ref 65–99)
GLUCOSE-CAPILLARY: 160 mg/dL — AB (ref 65–99)
Glucose-Capillary: 188 mg/dL — ABNORMAL HIGH (ref 65–99)

## 2016-09-24 MED ORDER — PEG 3350-KCL-NA BICARB-NACL 420 G PO SOLR
4000.0000 mL | Freq: Once | ORAL | Status: AC
  Administered 2016-09-24: 4000 mL via ORAL
  Filled 2016-09-24: qty 4000

## 2016-09-24 MED ORDER — SODIUM CHLORIDE 0.9 % IV SOLN
INTRAVENOUS | Status: DC
  Administered 2016-09-25: 11:00:00 via INTRAVENOUS

## 2016-09-24 NOTE — Progress Notes (Signed)
[facility] at [place] NAME: Amy [name]    MR#:  [id number]  DATE OF BIRTH:  September 28, 1927  SUBJECTIVE:   Patient presents with dark-colored stools, Denies any hematemesis , melena today  REVIEW OF SYSTEMS:    Review of Systems  Constitutional: Negative for fever, chills weight loss Positive for generalized weakness HENT: Negative for ear pain, nosebleeds, congestion, facial swelling, rhinorrhea, neck pain, neck stiffness and ear discharge.   Respiratory: Negative for cough, shortness of breath, wheezing  Cardiovascular: Negative for chest pain, palpitations and leg swelling.  Gastrointestinal: Negative for heartburn, abdominal pain, vomiting, diarrhea or consitpation Positive melena Genitourinary: Negative for dysuria, urgency, frequency, hematuria Musculoskeletal: Negative for back pain or joint pain Neurological: Negative for dizziness, seizures, syncope, focal weakness,  numbness and headaches.  Hematological: Does not bruise/bleed easily.  Psychiatric/Behavioral: Negative for hallucinations, confusion, dysphoric mood    Tolerating Diet: yes      DRUG ALLERGIES:   Allergies  Allergen Reactions  . Morphine And Related Shortness Of Breath  . Atenolol     Other reaction(s): Other (See Comments) Decreased heart rate  . Codeine     Rash, difficulty breathing, nausea.  . Nsaids     Other reaction(s): Unknown  . Rofecoxib     Other reaction(s): Unknown  . Sulfa Antibiotics     Other reaction(s): Unknown    VITALS:  Blood pressure (!) 134/29, pulse (!) 51, temperature 97.9 F (36.6 C), temperature source Oral, resp. rate 20, height 5\' 2"  (1.575 m), weight 94.1 kg (207 lb 8 oz), SpO2 96 %.  PHYSICAL EXAMINATION:  Constitutional: Appears well-developed and well-nourished. No distress. HENT: Normocephalic. Marland Kitchen Oropharynx is clear and moist.  Eyes: Conjunctivae and EOM are normal. PERRLA, no scleral icterus.  Neck: Normal  ROM. Neck supple. No JVD. No tracheal deviation. CVS: RRR, S1/S2 +, no murmurs, no gallops, no carotid bruit.  Pulmonary: Effort and breath sounds normal, no stridor, rhonchi, wheezes, rales.  Abdominal: Soft. BS +,  no distension, tenderness, rebound or guarding.  Musculoskeletal: Normal range of motion. No edema and no tenderness.  Neuro: Alert. CN 2-12 grossly intact. No focal deficits. Skin: Skin is warm and dry. No rash noted. Psychiatric: Normal mood and affect.      LABORATORY PANEL:   CBC  Recent Labs Lab 09/23/16 0331  WBC 8.0  HGB 10.1*  HCT 30.6*  PLT 244   ------------------------------------------------------------------------------------------------------------------  Chemistries   Recent Labs Lab 09/23/16 0331  NA 140  K 4.8  CL 108  CO2 24  GLUCOSE 176*  BUN 41*  CREATININE 1.71*  CALCIUM 9.1   ------------------------------------------------------------------------------------------------------------------  Cardiac Enzymes  Recent Labs Lab 09/21/16 1025  TROPONINI <0.03   ------------------------------------------------------------------------------------------------------------------  RADIOLOGY:  No results found.   ASSESSMENT AND PLAN:   83 year old female with history of chronic iron deficiency anemia who presents with melena and Hemoccult positive stools on aspirin and Plavix at home.  1. GI bleed with melena:Holding Plavix for a total of 5 days  Continue PPI Appreciate GI consult,Planning EGD and colonoscopy on Thursday,Needs bowel prep today Monitor hemoglobin and hematocrit and transfuse as needed. Hemodynamically stable  2. Acute on chronic anemia status post 2 units PRBCs Continue to monitor for need for transfusion. Hemoglobin 10.1 yesterday repeat hemoglobin is pending    3. Essential hypertension: Continue clonidine,  and Cardura   4. Diabetes: Continue sliding scale, ADA diet and discontinued Amaryl Levemir 10  units subcutaneous once daily  4.  Hypothyroid: Continue Synthroid  5. Hyperlipidemia: Continue statin   6. Hyperkalemia: Improved. Potassium at 4.8  7. Acute kidney injury on chronic kidney disease stage III: Creatinine better this morning Hold losartan Repeat BMP in a.m. creatinine is slightly better  May need nephrology consultation if no improvement   8. Chronic diastolic heart failure with preserved ejection fraction: Watch fluid balance  9. Hypertension with wide pulse pressure Continue clonidine and hydralazine is added, titrate as needed  Management plans discussed with the patient and she is in agreement.  CODE STATUS: DNR  TOTAL TIME TAKING CARE OF THIS PATIENT: 34 minutes.     POSSIBLE D/C Friday, DEPENDING ON CLINICAL CONDITION.   Nicholes Mango M.D on 09/24/2016 at 11:44 AM  Between 7am to 6pm - Pager - [phone] After 6pm go to [email or web address] - password [email or web address] [facility]  Office  [phone]  CC: Primary care physician; Leonel Ramsay, MD  Note: This dictation was prepared with Dragon dictation along with smaller phrase technology. Any transcriptional errors that result from this process are unintentional.

## 2016-09-24 NOTE — Progress Notes (Signed)
Pt just started on GoLytely. Had to wait on Pharmacy to bring it up.

## 2016-09-24 NOTE — Progress Notes (Signed)
   [name] MD, MRCP(U.K) [address]  [facility]  [place], [place] [address]  Main: Amy [name] is being followed for GI bleed   Subjective: No further bleeding    Objective: Vital signs in last 24 hours: Vitals:   09/23/16 2054 09/24/16 0342 09/24/16 0529 09/24/16 1216  BP: (!) 159/46 (!) 193/36 (!) 134/29 (!) 161/29  Pulse: (!) 51 63 (!) 51 (!) 53  Resp: 18 20    Temp: 97.7 F (36.5 C) 97.9 F (36.6 C)  97.7 F (36.5 C)  TempSrc: Oral Oral  Oral  SpO2: 97% 96%  97%  Weight:      Height:       Weight change:   Intake/Output Summary (Last 24 hours) at 09/24/16 1404 Last data filed at 09/24/16 0500  Gross per 24 hour  Intake              360 ml  Output              700 ml  Net             -340 ml     Exam: Heart:: Regular rate and rhythm, S1S2 present or without murmur or extra heart sounds Lungs: normal, clear to auscultation and clear to auscultation and percussion Abdomen: soft, nontender, normal bowel sounds   Lab Results: @LABTEST2 @ Micro Results: No results found for this or any previous visit (from the past 240 hour(s)). Studies/Results: No results found. Medications: I have reviewed the patient's current medications. Scheduled Meds: . cloNIDine  0.3 mg Transdermal Weekly  . doxazosin  8 mg Oral BID  . hydrALAZINE  25 mg Oral Q8H  . insulin aspart  0-9 Units Subcutaneous TID WC  . insulin detemir  10 Units Subcutaneous QHS  . levothyroxine  125 mcg Oral QAC breakfast  . mometasone-formoterol  2 puff Inhalation BID  . multivitamin with minerals  1 tablet Oral Daily  . oxybutynin  5 mg Oral TID  . pantoprazole (PROTONIX) IV  40 mg Intravenous Q12H  . polyethylene glycol-electrolytes  4,000 mL Oral Once  . simvastatin  40 mg Oral QHS  . vitamin B-12  1,000 mcg Oral Daily   Continuous Infusions: . sodium chloride     PRN Meds:.acetaminophen **OR** acetaminophen, hydrALAZINE, ipratropium-albuterol, ondansetron  **OR** ondansetron (ZOFRAN) IV, senna CBC Latest Ref Rng & Units 09/23/2016 09/22/2016 09/21/2016  WBC 3.6 - 11.0 K/uL 8.0 7.5 11.0  Hemoglobin 12.0 - 16.0 g/dL 10.1(L) 9.4(L) 7.9(L)  Hematocrit 35.0 - 47.0 % 30.6(L) 28.6(L) 24.6(L)  Platelets 150 - 440 K/uL 244 225 285     Assessment: Active Problems:   GI bleed  Amy [name] 83 y.o. female admitted on 09/21/16 with melena while on asprin and plavix. No overt bleeding overnight  . She does have a baseline iron defieicney anemia that has been eveluated by Dr Tiffany Kocher in the past and has been negative for any etiology .   Plan: 1. EGD+colonoscopy tomorrow  I have discussed alternative options, risks & benefits,  which include, but are not limited to, bleeding, infection, perforation,respiratory complication & drug reaction.  The patient agrees with this plan & written consent will be obtained.      LOS: 3 days   [name] 09/24/2016, 2:04 PM

## 2016-09-25 ENCOUNTER — Inpatient Hospital Stay: Payer: Medicare Other | Admitting: Anesthesiology

## 2016-09-25 ENCOUNTER — Encounter
Admission: EM | Disposition: A | Discharge status: Home / Self Care | Payer: Self-pay | Source: Home / Self Care | Attending: Internal Medicine

## 2016-09-25 ENCOUNTER — Encounter: Payer: Self-pay | Admitting: Anesthesiology

## 2016-09-25 DIAGNOSIS — K449 Diaphragmatic hernia without obstruction or gangrene: Secondary | ICD-10-CM

## 2016-09-25 HISTORY — PX: ESOPHAGOGASTRODUODENOSCOPY (EGD) WITH PROPOFOL: SHX5813

## 2016-09-25 HISTORY — PX: COLONOSCOPY WITH PROPOFOL: SHX5780

## 2016-09-25 LAB — CBC
HCT: 27.1 % — ABNORMAL LOW (ref 35.0–47.0)
Hemoglobin: 9 g/dL — ABNORMAL LOW (ref 12.0–16.0)
MCH: 28.1 pg (ref 26.0–34.0)
MCHC: 33.2 g/dL (ref 32.0–36.0)
MCV: 84.8 fL (ref 80.0–100.0)
PLATELETS: 218 10*3/uL (ref 150–440)
RBC: 3.2 MIL/uL — AB (ref 3.80–5.20)
RDW: 16.8 % — ABNORMAL HIGH (ref 11.5–14.5)
WBC: 7.3 10*3/uL (ref 3.6–11.0)

## 2016-09-25 LAB — GLUCOSE, CAPILLARY
GLUCOSE-CAPILLARY: 188 mg/dL — AB (ref 65–99)
GLUCOSE-CAPILLARY: 112 mg/dL — AB (ref 65–99)
Glucose-Capillary: 147 mg/dL — ABNORMAL HIGH (ref 65–99)
Glucose-Capillary: 137 mg/dL — ABNORMAL HIGH (ref 65–99)
Glucose-Capillary: 133 mg/dL — ABNORMAL HIGH (ref 65–99)

## 2016-09-25 SURGERY — ESOPHAGOGASTRODUODENOSCOPY (EGD) WITH PROPOFOL
Anesthesia: General

## 2016-09-25 MED ORDER — PROPOFOL 500 MG/50ML IV EMUL
INTRAVENOUS | Status: AC
  Filled 2016-09-25: qty 50

## 2016-09-25 MED ORDER — SODIUM CHLORIDE 0.9 % IV SOLN: INTRAVENOUS | Status: DC

## 2016-09-25 MED ORDER — BUTAMBEN-TETRACAINE-BENZOCAINE 2-2-14 % EX AERO
INHALATION_SPRAY | CUTANEOUS | Status: AC
  Filled 2016-09-25: qty 20

## 2016-09-25 MED ORDER — LIDOCAINE HCL (CARDIAC) 20 MG/ML IV SOLN
INTRAVENOUS | Status: DC | PRN
  Administered 2016-09-25: 30 mg via INTRAVENOUS

## 2016-09-25 MED ORDER — PEG 3350-KCL-NA BICARB-NACL 420 G PO SOLR
4000.0000 mL | Freq: Once | ORAL | Status: AC
  Administered 2016-09-25: 4000 mL via ORAL
  Filled 2016-09-25: qty 4000

## 2016-09-25 MED ORDER — LIDOCAINE HCL (PF) 2 % IJ SOLN
INTRAMUSCULAR | Status: AC
  Filled 2016-09-25: qty 2

## 2016-09-25 MED ORDER — FENTANYL CITRATE (PF) 100 MCG/2ML IJ SOLN
INTRAMUSCULAR | Status: AC
  Filled 2016-09-25: qty 2

## 2016-09-25 MED ORDER — FENTANYL CITRATE (PF) 100 MCG/2ML IJ SOLN
INTRAMUSCULAR | Status: DC | PRN
  Administered 2016-09-25: 50 ug via INTRAVENOUS

## 2016-09-25 MED ORDER — PROPOFOL 500 MG/50ML IV EMUL
INTRAVENOUS | Status: DC | PRN
  Administered 2016-09-25: 100 ug/kg/min via INTRAVENOUS

## 2016-09-25 NOTE — Anesthesia Preprocedure Evaluation (Signed)
Anesthesia Evaluation  Patient identified by MRN, date of birth, ID band Patient awake    Reviewed: Allergy & Precautions  Airway Mallampati: III       Dental  (+) Teeth Intact   Pulmonary shortness of breath and with exertion, COPD,     + decreased breath sounds      Cardiovascular Exercise Tolerance: Poor hypertension, Pt. on medications + angina + CAD, + Cardiac Stents and +CHF  III Rhythm:Regular Rate:Normal     Neuro/Psych    GI/Hepatic Neg liver ROS, hiatal hernia,   Endo/Other  diabetes, Type 2, Oral Hypoglycemic AgentsHypothyroidism Morbid obesity  Renal/GU      Musculoskeletal   Abdominal (+) + obese,   Peds  Hematology  (+) anemia ,   Anesthesia Other Findings   Reproductive/Obstetrics                             Anesthesia Physical Anesthesia Plan  ASA: IV  Anesthesia Plan: General   Post-op Pain Management:    Induction: Intravenous  PONV Risk Score and Plan: 0  Airway Management Planned: Natural Airway and Nasal Cannula  Additional Equipment:   Intra-op Plan:   Post-operative Plan:   Informed Consent: I have reviewed the patients History and Physical, chart, labs and discussed the procedure including the risks, benefits and alternatives for the proposed anesthesia with the patient or authorized representative who has indicated his/her understanding and acceptance.     Plan Discussed with: CRNA  Anesthesia Plan Comments:         Anesthesia Quick Evaluation

## 2016-09-25 NOTE — Anesthesia Post-op Follow-up Note (Cosign Needed)
Anesthesia QCDR form completed.        

## 2016-09-25 NOTE — Progress Notes (Signed)
[facility] NAME: Amy [name]    MR#:  [id number]  DATE OF BIRTH:  [birth year]  SUBJECTIVE:   No further melena. Had EGD which showed no evidence of bleeding had a colonoscopy was poor prep no evidence of bleeding noted  REVIEW OF SYSTEMS:    Review of Systems  Constitutional: Negative for fever, chills weight loss Positive for generalized weakness HENT: Negative for ear pain, nosebleeds, congestion, facial swelling, rhinorrhea, neck pain, neck stiffness and ear discharge.   Respiratory: Negative for cough, shortness of breath, wheezing  Cardiovascular: Negative for chest pain, palpitations and leg swelling.  Gastrointestinal: Negative for heartburn, abdominal pain, vomiting, diarrhea or consitpation Positive melena Genitourinary: Negative for dysuria, urgency, frequency, hematuria Musculoskeletal: Negative for back pain or joint pain Neurological: Negative for dizziness, seizures, syncope, focal weakness,  numbness and headaches.  Hematological: Does not bruise/bleed easily.  Psychiatric/Behavioral: Negative for hallucinations, confusion, dysphoric mood    Tolerating Diet: yes      DRUG ALLERGIES:   Allergies  Allergen Reactions  . Morphine And Related Shortness Of Breath  . Atenolol     Other reaction(s): Other (See Comments) Decreased heart rate  . Codeine     Rash, difficulty breathing, nausea.  . Nsaids     Other reaction(s): Unknown  . Rofecoxib     Other reaction(s): Unknown  . Sulfa Antibiotics     Other reaction(s): Unknown    VITALS:  Blood pressure (!) 142/38, pulse (!) 54, temperature 97.7 F (36.5 C), temperature source Oral, resp. rate 18, height 5\' 2"  (1.575 m), weight 212 lb (96.2 kg), SpO2 94 %.  PHYSICAL EXAMINATION:  Constitutional: Appears well-developed and well-nourished. No distress. HENT: Normocephalic. Marland Kitchen Oropharynx is clear and moist.  Eyes: Conjunctivae and EOM are normal.  PERRLA, no scleral icterus.  Neck: Normal ROM. Neck supple. No JVD. No tracheal deviation. CVS: RRR, S1/S2 +, no murmurs, no gallops, no carotid bruit.  Pulmonary: Effort and breath sounds normal, no stridor, rhonchi, wheezes, rales.  Abdominal: Soft. BS +,  no distension, tenderness, rebound or guarding.  Musculoskeletal: Normal range of motion. No edema and no tenderness.  Neuro: Alert. CN 2-12 grossly intact. No focal deficits. Skin: Skin is warm and dry. No rash noted. Psychiatric: Normal mood and affect.      LABORATORY PANEL:   CBC  Recent Labs Lab 09/25/16 0346  WBC 7.3  HGB 9.0*  HCT 27.1*  PLT 218   ------------------------------------------------------------------------------------------------------------------  Chemistries   Recent Labs Lab 09/23/16 0331  NA 140  K 4.8  CL 108  CO2 24  GLUCOSE 176*  BUN 41*  CREATININE 1.71*  CALCIUM 9.1   ------------------------------------------------------------------------------------------------------------------  Cardiac Enzymes  Recent Labs Lab 09/21/16 1025  TROPONINI <0.03   ------------------------------------------------------------------------------------------------------------------  RADIOLOGY:  No results found.   ASSESSMENT AND PLAN:   83 year old female with history of chronic iron deficiency anemia who presents with melena and Hemoccult positive stools on aspirin and Plavix at home.  1. GI bleed with melena:Plavix on hold Continue PPI Status post EGD and colonoscopy  2. Acute on chronic anemia status post 2 units PRBCs Continue to monitor for need for transfusion. Hemoglobin stable  3. 7. Acute kidney injury on chronic kidney disease stage III: Creatinine stable continue to follow Hold losartan Repeat BMP in a.m. creatinine is slightly better   4. Diabetes: Continue sliding scale, ADA diet  Levemir 10 units subcutaneous once daily Blood glucose stable  5 Hypothyroid: Continue  Synthroid  6. Hyperlipidemia: Continue statin  7. Hyperkalemia: Improved. Potassium stable   8. Chronic diastolic heart failure with preserved ejection fraction: Watch fluid balance  9. Hypertension with wide pulse pressure Continue clonidine and hydralazine is added, titrate as needed  Management plans discussed with the patient and she is in agreement.  CODE STATUS: DNR  TOTAL TIME TAKING CARE OF THIS PATIENT: 34 minutes.     POSSIBLE D/C Friday, DEPENDING ON CLINICAL CONDITION.   Dustin Flock M.D on 09/25/2016 at 2:26 PM  Between 7am to 6pm - Pager - [phone] After 6pm go to [email or web address] - password [email or web address] [facility]  Office  [phone]  CC: Primary care physician; Leonel Ramsay, MD  Note: This dictation was prepared with Dragon dictation along with smaller phrase technology. Any transcriptional errors that result from this process are unintentional.

## 2016-09-25 NOTE — Anesthesia Postprocedure Evaluation (Signed)
Anesthesia Post Note  Patient: Syndi C Trott  Procedure(s) Performed: Procedure(s) (LRB): ESOPHAGOGASTRODUODENOSCOPY (EGD) WITH PROPOFOL (N/A) COLONOSCOPY WITH PROPOFOL (N/A)  Patient location during evaluation: PACU Anesthesia Type: General Level of consciousness: awake Pain management: pain level controlled Vital Signs Assessment: post-procedure vital signs reviewed and stable Cardiovascular status: stable Anesthetic complications: no     Last Vitals:  Vitals:   09/25/16 1245 09/25/16 1303  BP: (!) 144/44 (!) 142/38  Pulse: (!) 52 (!) 54  Resp: 16 18  Temp:  36.5 C    Last Pain:  Vitals:   09/25/16 1303  TempSrc: Oral  PainSc:                  VAN STAVEREN,Mamie Hundertmark

## 2016-09-25 NOTE — Transfer of Care (Signed)
  Immediate Anesthesia Transfer of Care Note  Patient: Amy [name]  Procedure(s) Performed: Procedure(s): ESOPHAGOGASTRODUODENOSCOPY (EGD) WITH PROPOFOL (N/A) COLONOSCOPY WITH PROPOFOL (N/A)  Patient Location: PACU  Anesthesia Type:General  Level of Consciousness: awake and sedated  Airway & Oxygen Therapy: Patient Spontanous Breathing and Patient connected to nasal cannula oxygen  Post-op Assessment: Report given to RN and Post -op Vital signs reviewed and stable  Post vital signs: Reviewed and stable  Last Vitals:  Vitals:   09/25/16 0606 09/25/16 1042  BP: (!) 132/56 (!) 148/70  Pulse: (!) 52 (!) 50  Resp:  16  Temp:  (!) 36.3 C    Last Pain:  Vitals:   09/25/16 1042  TempSrc: Tympanic  PainSc:          Complications: No apparent anesthesia complications

## 2016-09-25 NOTE — Care Management Important Message (Signed)
Important Message  Patient Details  Name: Amy [name] MRN: [id number] Date of Birth: [birth year]   Medicare Important Message Given:  Yes    Beverly Sessions, RN 09/25/2016, 3:03 PM

## 2016-09-25 NOTE — H&P (Signed)
[name] MD [address] [place], [place] [address] Phone: [phone] Fax : [phone]  Primary Care Physician:  Leonel Ramsay, MD Primary Gastroenterologist:  Dr. [name]   Pre-Procedure History & Physical: HPI:  Amy [name] is a 83 y.o. female is here for an endoscopy and colonoscopy.   Past Medical History:  Diagnosis Date  . Bladder incontinence   . CAD (coronary artery disease)    a. cardiac cath 05/2009: proximal LAD 90% stenosis s/p PCI/Xience 2.75 x 12 mm DES, mid LAD 40%, diagonal 40%, proximal LCx 40% followed by 40% LCx lesion, 30%-40%-30% lesion noted in the RCA with distal RCA 30% and 25% lesions  . Chronic diastolic (congestive) heart failure ([place])    a. Lexiscan 2013: no ischemia, normal EF b. echo 09/2015: EF 55-60% w/ Grade 1 DD  . CKD (chronic kidney disease) stage 3, GFR 30-59 ml/min   . Degenerative arthritis of knee    bilateral knees  . Diabetes mellitus    Type II  . Hiatal hernia   . Hypertension   . Iron deficiency   . Menopausal symptoms   . Morbid obesity ([place])   . Thyroid disease    hypothyroidism    Past Surgical History:  Procedure Laterality Date  . COLONOSCOPY  2015  . REPLACEMENT TOTAL KNEE BILATERAL    . TOTAL VAGINAL HYSTERECTOMY     ovarian mass, not cancerous  . UPPER GI ENDOSCOPY  2015    Prior to Admission medications   Medication Sig Start Date End Date Taking? Authorizing Provider  aspirin 81 MG tablet Take 81 mg by mouth daily.   Yes [provider]  cloNIDine (CATAPRES - DOSED IN MG/24 HR) 0.3 mg/24hr patch Place 0.3 mg onto the skin once a week.  12/11/14  Yes [provider]  cloNIDine (CATAPRES) 0.1 MG tablet Take 1 tablet (0.1 mg total) by mouth 2 (two) times daily as needed. Take for pressure >160 09/03/16  Yes Gollan, Kathlene November, MD  clopidogrel (PLAVIX) 75 MG tablet Take 1 tablet (75 mg total) by mouth daily. 07/20/13  Yes Wellington Hampshire, MD  doxazosin (CARDURA) 8 MG  tablet Take 1 tablet (8 mg total) by mouth 2 (two) times daily. 11/14/15  Yes Gollan, Kathlene November, MD  furosemide (LASIX) 20 MG tablet Take 1 tablet (20 mg total) by mouth daily as needed. Patient taking differently: Take 20 mg by mouth daily.  06/02/16 09/21/16 Yes Gollan, Kathlene November, MD  glimepiride (AMARYL) 4 MG tablet Take 4 mg by mouth daily before breakfast.   Yes [provider]  ipratropium-albuterol (DUONEB) 0.5-2.5 (3) MG/3ML SOLN Take 3 mLs by nebulization every 4 (four) hours as needed (shortness of breath/wheezing.). 10/11/15  Yes Sainani, Belia Heman, MD  levothyroxine (SYNTHROID, LEVOTHROID) 125 MCG tablet Take 125 mcg by mouth daily.   Yes [provider]  losartan (COZAAR) 100 MG tablet Take 100 mg by mouth daily. 09/05/16  Yes [provider]  metFORMIN (GLUCOPHAGE) 1000 MG tablet Take 1,000 mg by mouth 2 (two) times daily with a meal.   Yes [provider]  Multiple Vitamin (MULTIVITAMIN) tablet Take 1 tablet by mouth daily.   Yes [provider]  oxybutynin (DITROPAN) 5 MG tablet Take 5 mg by mouth as needed.    Yes [provider]  pantoprazole (PROTONIX) 40 MG tablet Take 40 mg by mouth daily.  08/22/14  Yes [provider]  senna (SENOKOT) 8.6 MG TABS tablet Take  1 tablet (8.6 mg total) by mouth daily as needed for mild constipation. 10/11/15  Yes Henreitta Leber, MD  simvastatin (ZOCOR) 40 MG tablet Take 1 tablet (40 mg total) by mouth at bedtime. 08/22/16  Yes Minna Merritts, MD  SYMBICORT 160-4.5 MCG/ACT inhaler Inhale 2 puffs into the lungs 2 (two) times daily.  11/15/14  Yes [provider]  vitamin B-12 (CYANOCOBALAMIN) 1000 MCG tablet Take 1 tablet by mouth daily. 06/27/16 06/27/17 Yes [provider]  famotidine (PEPCID) 20 MG tablet Take 1 tablet (20 mg total) by mouth 2 (two) times daily. Patient not taking: Reported on 09/21/2016 11/14/15 11/13/16  Laverle Hobby, MD    Allergies as of 09/21/2016  - Review Complete 09/21/2016  Allergen Reaction Noted  . Morphine and related Shortness Of Breath 07/11/2013  . Atenolol  03/21/2015  . Codeine  06/24/2010  . Nsaids  07/11/2013  . Rofecoxib    . Sulfa antibiotics  07/11/2013    Family History  Problem Relation Age of Onset  . Breast cancer Mother 32    Social History   Social History  . Marital status: Widowed    Spouse name: N/A  . Number of children: N/A  . Years of education: N/A   Occupational History  . Not on file.   Social History Main Topics  . Smoking status: Never Smoker  . Smokeless tobacco: Never Used  . Alcohol use No  . Drug use: No  . Sexual activity: Not on file   Other Topics Concern  . Not on file   Social History Narrative  . No narrative on file    Review of Systems: See HPI, otherwise negative ROS  Physical Exam: BP (!) 148/70   Pulse (!) 50   Temp (!) 97.3 F (36.3 C) (Tympanic)   Resp 16   Ht 5\' 2"  (1.575 m)   Wt 212 lb (96.2 kg)   SpO2 95%   BMI 38.78 kg/m  General:   Alert,  pleasant and cooperative in NAD Head:  Normocephalic and atraumatic. Neck:  Supple; no masses or thyromegaly. Lungs:  Clear throughout to auscultation.    Heart:  Regular rate and rhythm. Abdomen:  Soft, nontender and nondistended. Normal bowel sounds, without guarding, and without rebound.   Neurologic:  Alert and  oriented x4;  grossly normal neurologically.  Impression/Plan: Amy [name] is here for an endoscopy and colonoscopy to be performed for melena  Risks, benefits, limitations, and alternatives regarding  endoscopy and colonoscopy have been reviewed with the patient.  Questions have been answered.  All parties agreeable.   [name], MD  09/25/2016, 11:08 AM

## 2016-09-25 NOTE — Anesthesia Procedure Notes (Signed)
Performed by: Vaughan Sine Pre-anesthesia Checklist: Patient identified, Emergency Drugs available, Suction available, Patient being monitored and Timeout performed Patient Re-evaluated:Patient Re-evaluated prior to induction Oxygen Delivery Method: Nasal cannula Preoxygenation: Pre-oxygenation with 100% oxygen Induction Type: IV induction Airway Equipment and Method: Bite block Placement Confirmation: CO2 detector and positive ETCO2

## 2016-09-25 NOTE — Op Note (Signed)
[facility] Gastroenterology Patient Name: Amy [name] Procedure Date: 09/25/2016 11:52 AM MRN: [id number] Account #: [id number] Date of Birth: 03/14/1927 Admit Type: Inpatient Age: 83 Room: [facility] ENDO ROOM 4 Gender: Female Note Status: Finalized Procedure:            Upper GI endoscopy Indications:          Melena Providers:            [name] MD, MD Referring MD:         Adrian Prows (Referring MD) Medicines:            Monitored Anesthesia Care Complications:        No immediate complications. Procedure:            Pre-Anesthesia Assessment:                       - Prior to the procedure, a History and Physical was                        performed, and patient medications, allergies and                        sensitivities were reviewed. The patient's tolerance of                        previous anesthesia was reviewed.                       - The risks and benefits of the procedure and the                        sedation options and risks were discussed with the                        patient. All questions were answered and informed                        consent was obtained.                       After obtaining informed consent, the endoscope was                        passed under direct vision. Throughout the procedure,                        the patient's blood pressure, pulse, and oxygen                        saturations were monitored continuously. The Endoscope                        was introduced through the mouth, and advanced to the                        third part of duodenum. The upper GI endoscopy was                        accomplished with ease. The patient tolerated the  procedure well. Findings:      The examined duodenum was normal.      The esophagus was normal.      A 5 cm hiatal hernia was present. Impression:           - Normal examined duodenum.                       - Normal esophagus.               - 5 cm hiatal hernia.                       - No specimens collected. Recommendation:       - Perform a colonoscopy today. Procedure Code(s):    --- Professional ---                       [phone], Esophagogastroduodenoscopy, flexible, transoral;                        diagnostic, including collection of specimen(s) by                        brushing or washing, when performed (separate procedure) Diagnosis Code(s):    --- Professional ---                       K44.9, Diaphragmatic hernia without obstruction or                        gangrene                       K92.1, Melena (includes Hematochezia) CPT copyright 2016 American Medical Association. All rights reserved. The codes documented in this report are preliminary and upon coder review may  be revised to meet current compliance requirements. [name], MD [name] MD, MD 09/25/2016 12:03:30 PM This report has been signed electronically. Number of Addenda: 0 Note Initiated On: 09/25/2016 11:52 AM      [facility]

## 2016-09-25 NOTE — Progress Notes (Signed)
EGD-no bleeding normal  Colonoscopy- poor prep - lots of stool- no blood.   Repeat colonoscopy. Can have clears till 5 pm then start prep   Dr [name] MD,MRCP [facility]) Gastroenterology/Hepatology Pager: [phone]

## 2016-09-25 NOTE — Op Note (Signed)
[facility] Gastroenterology Patient Name: Amy [name] Procedure Date: 09/25/2016 11:50 AM MRN: [id number] Account #: [id number] Date of Birth: [birth year] Admit Type: Outpatient Age: 83 Room: [facility] ENDO ROOM 4 Gender: Female Note Status: Finalized Procedure:            Colonoscopy Indications:          Melena Providers:            [name] MD, MD Referring MD:         Adrian Prows (Referring MD) Medicines:            Monitored Anesthesia Care Complications:        No immediate complications. Procedure:            Pre-Anesthesia Assessment:                       - Prior to the procedure, a History and Physical was                        performed, and patient medications, allergies and                        sensitivities were reviewed. The patient's tolerance of                        previous anesthesia was reviewed.                       - The risks and benefits of the procedure and the                        sedation options and risks were discussed with the                        patient. All questions were answered and informed                        consent was obtained.                       - ASA Grade Assessment: III - A patient with severe                        systemic disease.                       After obtaining informed consent, the colonoscope was                        passed under direct vision. Throughout the procedure,                        the patient's blood pressure, pulse, and oxygen                        saturations were monitored continuously. The                        Colonoscope was introduced through the anus with the                        intention of advancing  to the cecum. The scope was                        advanced to the sigmoid colon before the procedure was                        aborted. Medications were given. The colonoscopy was                        performed with ease. The patient tolerated the               procedure well. The quality of the bowel preparation                        was poor. Findings:      The perianal and digital rectal examinations were normal.      Multiple medium-mouthed diverticula were found in the sigmoid colon.      Copious quantities of stool was found in the rectum and in the sigmoid       colon, precluding visualization. Impression:           - Preparation of the colon was poor.                       - Diverticulosis in the sigmoid colon.                       - Stool in the rectum and in the sigmoid colon.                       - No specimens collected. Recommendation:       - Return patient to hospital ward for ongoing care.                       - No blood seen in sigmoid colon . Incomplete                        colonoscopy due to poor prep. Repeat colonoscopy                        tomorrow.                       - Clear liquid diet.                       - Continue present medications. Procedure Code(s):    --- Professional ---                       [phone], 62, Colonoscopy, flexible; diagnostic, including                        collection of specimen(s) by brushing or washing, when                        performed (separate procedure) Diagnosis Code(s):    --- Professional ---                       K92.1, Melena (includes Hematochezia)  K57.30, Diverticulosis of large intestine without                        perforation or abscess without bleeding CPT copyright 2016 American Medical Association. All rights reserved. The codes documented in this report are preliminary and upon coder review may  be revised to meet current compliance requirements. [name], MD [name] MD, MD 09/25/2016 12:10:32 PM This report has been signed electronically. Number of Addenda: 0 Note Initiated On: 09/25/2016 11:50 AM Total Procedure Duration: 0 hours 5 minutes 1 second       [facility]

## 2016-09-26 ENCOUNTER — Inpatient Hospital Stay: Payer: Medicare Other | Admitting: Anesthesiology

## 2016-09-26 ENCOUNTER — Encounter
Admission: EM | Disposition: A | Discharge status: Home / Self Care | Payer: Self-pay | Source: Home / Self Care | Attending: Internal Medicine

## 2016-09-26 DIAGNOSIS — D124 Benign neoplasm of descending colon: Secondary | ICD-10-CM | POA: Diagnosis not present

## 2016-09-26 DIAGNOSIS — D125 Benign neoplasm of sigmoid colon: Secondary | ICD-10-CM | POA: Diagnosis not present

## 2016-09-26 DIAGNOSIS — D122 Benign neoplasm of ascending colon: Secondary | ICD-10-CM | POA: Diagnosis not present

## 2016-09-26 DIAGNOSIS — K921 Melena: Secondary | ICD-10-CM | POA: Diagnosis not present

## 2016-09-26 HISTORY — PX: COLONOSCOPY WITH PROPOFOL: SHX5780

## 2016-09-26 LAB — GLUCOSE, CAPILLARY
GLUCOSE-CAPILLARY: 173 mg/dL — AB (ref 65–99)
GLUCOSE-CAPILLARY: 209 mg/dL — AB (ref 65–99)

## 2016-09-26 LAB — CBC WITH DIFFERENTIAL/PLATELET
BASOS ABS: 0 10*3/uL (ref 0–0.1)
Basophils Relative: 1 %
EOS ABS: 0.2 10*3/uL (ref 0–0.7)
EOS PCT: 3 %
HCT: 33.4 % — ABNORMAL LOW (ref 35.0–47.0)
HEMOGLOBIN: 10.9 g/dL — AB (ref 12.0–16.0)
LYMPHS PCT: 27 %
Lymphs Abs: 1.9 10*3/uL (ref 1.0–3.6)
MCH: 28 pg (ref 26.0–34.0)
MCHC: 32.5 g/dL (ref 32.0–36.0)
MCV: 86.2 fL (ref 80.0–100.0)
Monocytes Absolute: 0.7 10*3/uL (ref 0.2–0.9)
Monocytes Relative: 10 %
NEUTROS PCT: 59 %
Neutro Abs: 4.2 10*3/uL (ref 1.4–6.5)
PLATELETS: 219 10*3/uL (ref 150–440)
RBC: 3.87 MIL/uL (ref 3.80–5.20)
RDW: 16.8 % — ABNORMAL HIGH (ref 11.5–14.5)
WBC: 7.1 10*3/uL (ref 3.6–11.0)

## 2016-09-26 SURGERY — COLONOSCOPY WITH PROPOFOL
Anesthesia: General

## 2016-09-26 MED ORDER — SODIUM CHLORIDE 0.9 % IV SOLN
INTRAVENOUS | Status: DC
  Administered 2016-09-26: 08:00:00 via INTRAVENOUS

## 2016-09-26 MED ORDER — PROPOFOL 10 MG/ML IV BOLUS
INTRAVENOUS | Status: DC | PRN
  Administered 2016-09-26: 40 mg via INTRAVENOUS

## 2016-09-26 MED ORDER — LIDOCAINE HCL (CARDIAC) 20 MG/ML IV SOLN
INTRAVENOUS | Status: DC | PRN
  Administered 2016-09-26: 40 mg via INTRAVENOUS

## 2016-09-26 MED ORDER — PROPOFOL 10 MG/ML IV BOLUS
INTRAVENOUS | Status: AC
  Filled 2016-09-26: qty 20

## 2016-09-26 MED ORDER — PROPOFOL 500 MG/50ML IV EMUL
INTRAVENOUS | Status: DC | PRN
  Administered 2016-09-26: 100 ug/kg/min via INTRAVENOUS

## 2016-09-26 MED ORDER — CLOPIDOGREL BISULFATE 75 MG PO TABS: 75.0000 mg | ORAL_TABLET | Freq: Every day | ORAL | 3 refills | Status: DC

## 2016-09-26 NOTE — Anesthesia Procedure Notes (Signed)
Date/Time: 09/26/2016 8:58 AM Performed by: Doreen Salvage Pre-anesthesia Checklist: Patient identified, Emergency Drugs available, Suction available and Patient being monitored Patient Re-evaluated:Patient Re-evaluated prior to induction Oxygen Delivery Method: Nasal cannula Induction Type: IV induction Dental Injury: Teeth and Oropharynx as per pre-operative assessment  Comments: Nasal cannula with etCO2 monitoring

## 2016-09-26 NOTE — Anesthesia Postprocedure Evaluation (Signed)
Anesthesia Post Note  Patient: Amy [name]  Procedure(s) Performed: Procedure(s) (LRB): COLONOSCOPY WITH PROPOFOL (N/A)  Patient location during evaluation: Endoscopy Anesthesia Type: General Level of consciousness: awake and alert Pain management: pain level controlled Vital Signs Assessment: post-procedure vital signs reviewed and stable Respiratory status: spontaneous breathing, nonlabored ventilation, respiratory function stable and patient connected to nasal cannula oxygen Cardiovascular status: blood pressure returned to baseline and stable Postop Assessment: no signs of nausea or vomiting Anesthetic complications: no     Last Vitals:  Vitals:   09/26/16 1007 09/26/16 1058  BP:  (!) 154/90  Pulse: (!) 52 (!) 48  Resp: 12 18  Temp:  36.8 C    Last Pain:  Vitals:   09/26/16 1058  TempSrc: Oral  PainSc:                  Hayes Rehfeldt S

## 2016-09-26 NOTE — Anesthesia Preprocedure Evaluation (Signed)
Anesthesia Evaluation  Patient identified by MRN, date of birth, ID band Patient awake    Reviewed: Allergy & Precautions, NPO status , Patient's Chart, lab work & pertinent test results, reviewed documented beta blocker date and time   Airway Mallampati: III  TM Distance: >3 FB     Dental  (+) Chipped   Pulmonary shortness of breath, COPD,           Cardiovascular hypertension, Pt. on medications + angina + CAD and +CHF       Neuro/Psych  Neuromuscular disease    GI/Hepatic hiatal hernia,   Endo/Other  diabetes, Type 2Hypothyroidism   Renal/GU      Musculoskeletal  (+) Arthritis ,   Abdominal   Peds  Hematology  (+) anemia ,   Anesthesia Other Findings   Reproductive/Obstetrics                             Anesthesia Physical Anesthesia Plan  ASA: III  Anesthesia Plan: General   Post-op Pain Management:    Induction: Intravenous  PONV Risk Score and Plan:   Airway Management Planned:   Additional Equipment:   Intra-op Plan:   Post-operative Plan:   Informed Consent: I have reviewed the patients History and Physical, chart, labs and discussed the procedure including the risks, benefits and alternatives for the proposed anesthesia with the patient or authorized representative who has indicated his/her understanding and acceptance.     Plan Discussed with: CRNA  Anesthesia Plan Comments:         Anesthesia Quick Evaluation

## 2016-09-26 NOTE — Progress Notes (Signed)
Patient discharged to home as ordered, appointment and follow up appointments given as ordered. IV discontinued site clean dry and intact. Patient alert and oriented, no acute distress noted.

## 2016-09-26 NOTE — Discharge Instructions (Signed)
[facility]:  Diabetic diet, cardiac  DISCHARGE CONDITION:  Stable  ACTIVITY:  Activity as tolerated  OXYGEN:  Home Oxygen: No.   Oxygen Delivery: room air  DISCHARGE LOCATION:  home    ADDITIONAL DISCHARGE INSTRUCTION: restart plavix tomm, resume asprin in week if no bleeding   If you experience worsening of your admission symptoms, develop shortness of breath, life threatening emergency, suicidal or homicidal thoughts you must seek medical attention immediately by calling 911 or calling your MD immediately  if symptoms less severe.  You Must read complete instructions/literature along with all the possible adverse reactions/side effects for all the Medicines you take and that have been prescribed to you. Take any new Medicines after you have completely understood and accpet all the possible adverse reactions/side effects.   Please note  You were cared for by a hospitalist during your hospital stay. If you have any questions about your discharge medications or the care you received while you were in the hospital after you are discharged, you can call the unit and asked to speak with the hospitalist on call if the hospitalist that took care of you is not available. Once you are discharged, your primary care physician will handle any further medical issues. Please note that NO REFILLS for any discharge medications will be authorized once you are discharged, as it is imperative that you return to your primary care physician (or establish a relationship with a primary care physician if you do not have one) for your aftercare needs so that they can reassess your need for medications and monitor your lab values.

## 2016-09-26 NOTE — Transfer of Care (Signed)
Immediate Anesthesia Transfer of Care Note  Patient: Amy [name]  Procedure(s) Performed: Procedure(s): COLONOSCOPY WITH PROPOFOL (N/A)  Patient Location: PACU and Endoscopy Unit  Anesthesia Type:General  Level of Consciousness: sedated  Airway & Oxygen Therapy: Patient Spontanous Breathing and Patient connected to nasal cannula oxygen  Post-op Assessment: Report given to RN and Post -op Vital signs reviewed and stable  Post vital signs: Reviewed and stable  Last Vitals:  Vitals:   09/26/16 0813 09/26/16 0927  BP: (!) 154/44 (!) 124/54  Pulse: (!) 52 67  Resp: 20 (!) 7  Temp: 59.0 C     Complications: No apparent anesthesia complications

## 2016-09-26 NOTE — H&P (Signed)
[name] MD [address] [place], [place] [address] Phone: [phone] Fax : [phone]  Primary Care Physician:  Leonel Ramsay, MD Primary Gastroenterologist:  Dr. [name]   Pre-Procedure History & Physical: HPI:  Amy [name] is a 83 y.o. female is here for an colonoscopy.   Past Medical History:  Diagnosis Date  . Bladder incontinence   . CAD (coronary artery disease)    a. cardiac cath 05/2009: proximal LAD 90% stenosis s/p PCI/Xience 2.75 x 12 mm DES, mid LAD 40%, diagonal 40%, proximal LCx 40% followed by 40% LCx lesion, 30%-40%-30% lesion noted in the RCA with distal RCA 30% and 25% lesions  . Chronic diastolic (congestive) heart failure ([place])    a. Lexiscan 2013: no ischemia, normal EF b. echo 09/2015: EF 55-60% w/ Grade 1 DD  . CKD (chronic kidney disease) stage 3, GFR 30-59 ml/min   . Degenerative arthritis of knee    bilateral knees  . Diabetes mellitus    Type II  . Hiatal hernia   . Hypertension   . Iron deficiency   . Menopausal symptoms   . Morbid obesity ([place])   . Thyroid disease    hypothyroidism    Past Surgical History:  Procedure Laterality Date  . COLONOSCOPY  2015  . COLONOSCOPY WITH PROPOFOL N/A 09/25/2016   Procedure: COLONOSCOPY WITH PROPOFOL;  Surgeon: [name], MD;  Location[facility] ENDOSCOPY;  Service: Gastroenterology;  Laterality: N/A;  . ESOPHAGOGASTRODUODENOSCOPY (EGD) WITH PROPOFOL N/A 09/25/2016   Procedure: ESOPHAGOGASTRODUODENOSCOPY (EGD) WITH PROPOFOL;  Surgeon: [name], MD;  Location: [facility] ENDOSCOPY;  Service: Gastroenterology;  Laterality: N/A;  . REPLACEMENT TOTAL KNEE BILATERAL    . TOTAL VAGINAL HYSTERECTOMY     ovarian mass, not cancerous  . UPPER GI ENDOSCOPY  2015    Prior to Admission medications   Medication Sig Start Date End Date Taking? Authorizing Provider  aspirin 81 MG tablet Take 81 mg by mouth daily.   Yes [provider]  cloNIDine (CATAPRES - DOSED IN MG/24 HR) 0.3 mg/24hr  patch Place 0.3 mg onto the skin once a week.  12/11/14  Yes [provider]  cloNIDine (CATAPRES) 0.1 MG tablet Take 1 tablet (0.1 mg total) by mouth 2 (two) times daily as needed. Take for pressure >160 09/03/16  Yes Gollan, Kathlene November, MD  clopidogrel (PLAVIX) 75 MG tablet Take 1 tablet (75 mg total) by mouth daily. 07/20/13  Yes Wellington Hampshire, MD  doxazosin (CARDURA) 8 MG tablet Take 1 tablet (8 mg total) by mouth 2 (two) times daily. 11/14/15  Yes Gollan, Kathlene November, MD  furosemide (LASIX) 20 MG tablet Take 1 tablet (20 mg total) by mouth daily as needed. Patient taking differently: Take 20 mg by mouth daily.  06/02/16 09/21/16 Yes Gollan, Kathlene November, MD  glimepiride (AMARYL) 4 MG tablet Take 4 mg by mouth daily before breakfast.   Yes [provider]  ipratropium-albuterol (DUONEB) 0.5-2.5 (3) MG/3ML SOLN Take 3 mLs by nebulization every 4 (four) hours as needed (shortness of breath/wheezing.). 10/11/15  Yes Sainani, Belia Heman, MD  levothyroxine (SYNTHROID, LEVOTHROID) 125 MCG tablet Take 125 mcg by mouth daily.   Yes [provider]  losartan (COZAAR) 100 MG tablet Take 100 mg by mouth daily. 09/05/16  Yes [provider]  metFORMIN (GLUCOPHAGE) 1000 MG tablet Take 1,000 mg by mouth 2 (two) times daily with a meal.   Yes [provider]  Multiple Vitamin (MULTIVITAMIN) tablet Take 1 tablet  by mouth daily.   Yes [provider]  oxybutynin (DITROPAN) 5 MG tablet Take 5 mg by mouth as needed.    Yes [provider]  pantoprazole (PROTONIX) 40 MG tablet Take 40 mg by mouth daily.  08/22/14  Yes [provider]  senna (SENOKOT) 8.6 MG TABS tablet Take 1 tablet (8.6 mg total) by mouth daily as needed for mild constipation. 10/11/15  Yes Henreitta Leber, MD  simvastatin (ZOCOR) 40 MG tablet Take 1 tablet (40 mg total) by mouth at bedtime. 08/22/16  Yes Minna Merritts, MD  SYMBICORT 160-4.5 MCG/ACT inhaler Inhale 2 puffs into the lungs 2  (two) times daily.  11/15/14  Yes [provider]  vitamin B-12 (CYANOCOBALAMIN) 1000 MCG tablet Take 1 tablet by mouth daily. 06/27/16 06/27/17 Yes [provider]  famotidine (PEPCID) 20 MG tablet Take 1 tablet (20 mg total) by mouth 2 (two) times daily. Patient not taking: Reported on 09/21/2016 11/14/15 11/13/16  Laverle Hobby, MD    Allergies as of 09/21/2016 - Review Complete 09/21/2016  Allergen Reaction Noted  . Morphine and related Shortness Of Breath 07/11/2013  . Atenolol  03/21/2015  . Codeine  06/24/2010  . Nsaids  07/11/2013  . Rofecoxib    . Sulfa antibiotics  07/11/2013    Family History  Problem Relation Age of Onset  . Breast cancer Mother 68    Social History   Social History  . Marital status: Widowed    Spouse name: N/A  . Number of children: N/A  . Years of education: N/A   Occupational History  . Not on file.   Social History Main Topics  . Smoking status: Never Smoker  . Smokeless tobacco: Never Used  . Alcohol use No  . Drug use: No  . Sexual activity: Not on file   Other Topics Concern  . Not on file   Social History Narrative  . No narrative on file    Review of Systems: See HPI, otherwise negative ROS  Physical Exam: BP (!) 154/44   Pulse (!) 52   Temp 97.9 F (36.6 C) (Tympanic)   Resp 20   Ht 5\' 2"  (1.575 m)   Wt 212 lb (96.2 kg)   SpO2 97%   BMI 38.78 kg/m  General:   Alert,  pleasant and cooperative in NAD Head:  Normocephalic and atraumatic. Neck:  Supple; no masses or thyromegaly. Lungs:  Clear throughout to auscultation.    Heart:  Regular rate and rhythm. Abdomen:  Soft, nontender and nondistended. Normal bowel sounds, without guarding, and without rebound.   Neurologic:  Alert and  oriented x4;  grossly normal neurologically.  Impression/Plan: Amy [name] is here for an colonoscopy to be performed for melena  Risks, benefits, limitations, and alternatives regarding  colonoscopy have  been reviewed with the patient.  Questions have been answered.  All parties agreeable.   [name], MD  09/26/2016, 8:51 AM

## 2016-09-26 NOTE — Care Management (Signed)
Patient admitted from home with GI bleed. PCP [facility].  [facility].  Patient has Rollator at home for ambulation.  RN staff has ambulated patient, and reported that she is steady on her feet. HGB is stable and patient for discharge today.  No RNCM needs identified.  RNCM signing off.

## 2016-09-26 NOTE — Progress Notes (Signed)
Report called to Santiago Glad post colonoscopy. Patient awake, alert and oriented.

## 2016-09-26 NOTE — Op Note (Signed)
[facility] Gastroenterology Patient Name: Amy [name] Procedure Date: 09/26/2016 8:57 AM MRN: [id number] Account #: [id number] Date of Birth: 03-29-1927 Admit Type: Inpatient Age: 83 Room: [facility] ENDO ROOM 4 Gender: Female Note Status: Finalized Procedure:            Colonoscopy Indications:          Melena Providers:            [name] MD, MD Referring MD:         Adrian Prows (Referring MD) Medicines:            Monitored Anesthesia Care Complications:        No immediate complications. Procedure:            Pre-Anesthesia Assessment:                       - Prior to the procedure, a History and Physical was                        performed, and patient medications, allergies and                        sensitivities were reviewed. The patient's tolerance of                        previous anesthesia was reviewed.                       - The risks and benefits of the procedure and the                        sedation options and risks were discussed with the                        patient. All questions were answered and informed                        consent was obtained.                       - ASA Grade Assessment: III - A patient with severe                        systemic disease.                       After obtaining informed consent, the colonoscope was                        passed under direct vision. Throughout the procedure,                        the patient's blood pressure, pulse, and oxygen                        saturations were monitored continuously. The                        Colonoscope was introduced through the anus and                        advanced to the the  cecum, identified by the                        appendiceal orifice, IC valve and transillumination.                        The colonoscopy was performed with ease. The patient                        tolerated the procedure well. The quality of the bowel        preparation was adequate to identify polyps. Findings:      The perianal and digital rectal examinations were normal.      Multiple small-mouthed diverticula were found in the entire colon.      Non-bleeding internal hemorrhoids were found during retroflexion. The       hemorrhoids were medium-sized and Grade I (internal hemorrhoids that do       not prolapse).      Three sessile polyps were found in the descending colon, ascending colon       and cecum. The polyps were 3 to 5 mm in size. These polyps were removed       with a cold biopsy forceps. Resection and retrieval were complete.      A 8 mm polyp was found in the sigmoid colon. The polyp was       semi-pedunculated. The polyp was removed with a hot snare. Resection and       retrieval were complete.      The exam was otherwise without abnormality on direct and retroflexion       views. Impression:           - Diverticulosis in the entire examined colon.                       - Non-bleeding internal hemorrhoids.                       - Three 3 to 5 mm polyps in the descending colon, in                        the ascending colon and in the cecum, removed with a                        cold biopsy forceps. Resected and retrieved.                       - One 8 mm polyp in the sigmoid colon, removed with a                        hot snare. Resected and retrieved.                       - The examination was otherwise normal on direct and                        retroflexion views. Recommendation:       - Return patient to hospital ward for ongoing care.                       - Advance diet as tolerated.                       -  Continue present medications.                       - Await pathology results.                       - 1. Likely a diverticular bleed. No blood in the colon                        today                       2. If plavix is being planned to restart can be done                        tomorrow with close follow  up with PCP to watch for                        bleeding .                       3. I will sign out please call with questions Procedure Code(s):    --- Professional ---                       [phone], Colonoscopy, flexible; with removal of tumor(s),                        polyp(s), or other lesion(s) by snare technique                       [id number], 59, Colonoscopy, flexible; with biopsy, single                        or multiple Diagnosis Code(s):    --- Professional ---                       K64.0, First degree hemorrhoids                       D12.4, Benign neoplasm of descending colon                       D12.2, Benign neoplasm of ascending colon                       D12.0, Benign neoplasm of cecum                       D12.5, Benign neoplasm of sigmoid colon                       K92.1, Melena (includes Hematochezia)                       K57.30, Diverticulosis of large intestine without                        perforation or abscess without bleeding CPT copyright 2016 [employer]. All rights reserved. The codes documented in this report are preliminary and upon coder review may  be revised to meet current compliance requirements. [name], MD [name] MD, MD 09/26/2016 9:25:24 AM This report has  been signed electronically. Number of Addenda: 0 Note Initiated On: 09/26/2016 8:57 AM Scope Withdrawal Time: 0 hours 13 minutes 3 seconds  Total Procedure Duration: 0 hours 17 minutes 39 seconds       [facility]

## 2016-09-26 NOTE — Discharge Summary (Signed)
[facility] [place] [facility] [place], 83 y.o., DOB 01/22/1928, MRN [id number]. Admission date: 09/21/2016 Discharge Date 09/26/2016 Primary MD Leonel Ramsay, MD Admitting Physician [name], MD  Admission Diagnosis  Melena [K92.1] Hyperkalemia [E87.5] Fall [W19.XXXA] Contusion of face, initial encounter [S00.83XA] Syncope, unspecified syncope type [R55]  Discharge Diagnosis   Active Problems:   GI bleed likely lower due to diverticular bleed  Acute on chronic anemia due to blood loss Acute kidney injury on chronic kidney injury disease stage III Diabetes type 2 Hypothyroidism Hyperlipidemia Hyperkalemia Chronic diastolic heart failure Essential hypertension Coronary artery disease with previous stents   Hospital Course  This 83 year old female who lives at home by herself. Her today she could not hardly get up out of bed she fell out of bed. She pressed her alert button but it did not work. She crawled to the door and finally was able to flag somebody down on her porch. In the emergency room she was noted to have hypotension and be anemic. Patient also had dark stools. She was admitted for further evaluation. Due to her hemoglobin being so low she was transfused. Her blood pressure normalized afterwards. Her blood count is currently stable. Her aspirin and Plavix were held. GI saw the patient and help Plavix for 2 days prior to performing an endoscopy and colonoscopy. She was noted to have some polyps. No other source of GI bleeding identified. GI felt that this was likely diverticular in nature. Recommended close monitoring of her CBC. Patient does have a history of coronary artery disease her Plavix can be resumed tomorrow I told her to hold aspirin for a week if she has no further bleeding and hemoglobin stable per primary care provider can resume aspirin.            Consults  GI  Significant Tests:  See full reports for all  details     Ct Head Wo Contrast  Result Date: 09/21/2016 CLINICAL DATA:  Syncope.  Closed head injury. EXAM: CT HEAD WITHOUT CONTRAST CT MAXILLOFACIAL WITHOUT CONTRAST CT CERVICAL SPINE WITHOUT CONTRAST TECHNIQUE: Multidetector CT imaging of the head, cervical spine, and maxillofacial structures were performed using the standard protocol without intravenous contrast. Multiplanar CT image reconstructions of the cervical spine and maxillofacial structures were also generated. COMPARISON:  None. FINDINGS: CT HEAD FINDINGS Brain: No evidence of acute infarction, hemorrhage, hydrocephalus, extra-axial collection or mass lesion/mass effect. Mild patchy areas of low attenuation are identified within the subcortical and periventricular white matter compatible with chronic small vessel ischemic change. Vascular: No hyperdense vessel or unexpected calcification. Skull: Normal. Negative for fracture or focal lesion. Other: None. CT MAXILLOFACIAL FINDINGS Osseous: No fracture or mandibular dislocation. No destructive process. Orbits: Negative. No traumatic or inflammatory finding. Sinuses: Very mild mucosal thickening involving the dependent portion of the left maxillary sinus, image 34 of series 18. Soft tissues: Subcutaneous soft tissue stranding along the left side of face is identified compatible with contusion. CT CERVICAL SPINE FINDINGS Alignment: There is normal alignment of the cervical spine. Skull base and vertebrae: No acute fracture. No primary bone lesion or focal pathologic process. Soft tissues and spinal canal: No prevertebral fluid or swelling. No visible canal hematoma. Disc levels: Multi level disc space narrowing and ventral endplate spurring is identified. This is most advanced at C6-7. Upper chest: Negative. Other: None IMPRESSION: 1. No acute intracranial abnormalities. Small vessel ischemic change. 2. Left facial contusion. 3. No evidence for facial bone fracture  4. No evidence for cervical spine  fracture. 5. Cervical degenerative disc disease. Electronically Signed   By: Kerby Moors M.D.   On: 09/21/2016 11:13   Ct Cervical Spine Wo Contrast  Result Date: 09/21/2016 CLINICAL DATA:  Syncope.  Closed head injury. EXAM: CT HEAD WITHOUT CONTRAST CT MAXILLOFACIAL WITHOUT CONTRAST CT CERVICAL SPINE WITHOUT CONTRAST TECHNIQUE: Multidetector CT imaging of the head, cervical spine, and maxillofacial structures were performed using the standard protocol without intravenous contrast. Multiplanar CT image reconstructions of the cervical spine and maxillofacial structures were also generated. COMPARISON:  None. FINDINGS: CT HEAD FINDINGS Brain: No evidence of acute infarction, hemorrhage, hydrocephalus, extra-axial collection or mass lesion/mass effect. Mild patchy areas of low attenuation are identified within the subcortical and periventricular white matter compatible with chronic small vessel ischemic change. Vascular: No hyperdense vessel or unexpected calcification. Skull: Normal. Negative for fracture or focal lesion. Other: None. CT MAXILLOFACIAL FINDINGS Osseous: No fracture or mandibular dislocation. No destructive process. Orbits: Negative. No traumatic or inflammatory finding. Sinuses: Very mild mucosal thickening involving the dependent portion of the left maxillary sinus, image 34 of series 18. Soft tissues: Subcutaneous soft tissue stranding along the left side of face is identified compatible with contusion. CT CERVICAL SPINE FINDINGS Alignment: There is normal alignment of the cervical spine. Skull base and vertebrae: No acute fracture. No primary bone lesion or focal pathologic process. Soft tissues and spinal canal: No prevertebral fluid or swelling. No visible canal hematoma. Disc levels: Multi level disc space narrowing and ventral endplate spurring is identified. This is most advanced at C6-7. Upper chest: Negative. Other: None IMPRESSION: 1. No acute intracranial abnormalities. Small vessel  ischemic change. 2. Left facial contusion. 3. No evidence for facial bone fracture 4. No evidence for cervical spine fracture. 5. Cervical degenerative disc disease. Electronically Signed   By: Kerby Moors M.D.   On: 09/21/2016 11:13   Dg Chest Port 1 View  Result Date: 09/21/2016 CLINICAL DATA:  Patient with weakness. EXAM: PORTABLE CHEST 1 VIEW COMPARISON:  Chest radiograph 10/08/2015 FINDINGS: Monitoring leads overlie the patient. Stable cardiomegaly. Calcification of the thoracic aorta. No consolidative pulmonary opacities. No pleural effusion or pneumothorax. IMPRESSION: Cardiomegaly.  No acute cardiopulmonary process. Electronically Signed   By: Lovey Newcomer M.D.   On: 09/21/2016 11:06   Dg Knee Complete 4 Views Left  Result Date: 09/21/2016 CLINICAL DATA:  Patient fell and is now having discomfort in her left knee. EXAM: LEFT KNEE - COMPLETE 4+ VIEW COMPARISON:  None. FINDINGS: Components of left knee arthroplasty project in expected location. Negative for fracture or dislocation. Regional soft tissues unremarkable. IMPRESSION: 1. Left knee arthroplasty.  No acute findings. Electronically Signed   By: Lucrezia Europe M.D.   On: 09/21/2016 11:10   Ct Maxillofacial Wo Contrast  Result Date: 09/21/2016 CLINICAL DATA:  Syncope.  Closed head injury. EXAM: CT HEAD WITHOUT CONTRAST CT MAXILLOFACIAL WITHOUT CONTRAST CT CERVICAL SPINE WITHOUT CONTRAST TECHNIQUE: Multidetector CT imaging of the head, cervical spine, and maxillofacial structures were performed using the standard protocol without intravenous contrast. Multiplanar CT image reconstructions of the cervical spine and maxillofacial structures were also generated. COMPARISON:  None. FINDINGS: CT HEAD FINDINGS Brain: No evidence of acute infarction, hemorrhage, hydrocephalus, extra-axial collection or mass lesion/mass effect. Mild patchy areas of low attenuation are identified within the subcortical and periventricular white matter compatible with  chronic small vessel ischemic change. Vascular: No hyperdense vessel or unexpected calcification. Skull: Normal. Negative for fracture or focal lesion. Other: None.  CT MAXILLOFACIAL FINDINGS Osseous: No fracture or mandibular dislocation. No destructive process. Orbits: Negative. No traumatic or inflammatory finding. Sinuses: Very mild mucosal thickening involving the dependent portion of the left maxillary sinus, image 34 of series 18. Soft tissues: Subcutaneous soft tissue stranding along the left side of face is identified compatible with contusion. CT CERVICAL SPINE FINDINGS Alignment: There is normal alignment of the cervical spine. Skull base and vertebrae: No acute fracture. No primary bone lesion or focal pathologic process. Soft tissues and spinal canal: No prevertebral fluid or swelling. No visible canal hematoma. Disc levels: Multi level disc space narrowing and ventral endplate spurring is identified. This is most advanced at C6-7. Upper chest: Negative. Other: None IMPRESSION: 1. No acute intracranial abnormalities. Small vessel ischemic change. 2. Left facial contusion. 3. No evidence for facial bone fracture 4. No evidence for cervical spine fracture. 5. Cervical degenerative disc disease. Electronically Signed   By: Kerby Moors M.D.   On: 09/21/2016 11:13       Today   Subjective:   Amy [name]  patient feeling well denies any chest pain or shortness of breath  Objective:   Blood pressure (!) 147/36, pulse 68, temperature 97.6 F (36.4 C), temperature source Oral, resp. rate 18, height 5\' 2"  (1.575 m), weight 212 lb (96.2 kg), SpO2 97 %.  .  Intake/Output Summary (Last 24 hours) at 09/26/16 1457 Last data filed at 09/26/16 1337  Gross per 24 hour  Intake             1800 ml  Output                0 ml  Net             1800 ml    Exam VITAL SIGNS: Blood pressure (!) 147/36, pulse 68, temperature 97.6 F (36.4 C), temperature source Oral, resp. rate 18, height 5\' 2"   (1.575 m), weight 212 lb (96.2 kg), SpO2 97 %.  GENERAL:  83 y.o.-year-old patient lying in the bed with no acute distress.  EYES: Pupils equal, round, reactive to light and accommodation. No scleral icterus. Extraocular muscles intact.  HEENT: Head atraumatic, normocephalic. Oropharynx and nasopharynx clear.  NECK:  Supple, no jugular venous distention. No thyroid enlargement, no tenderness.  LUNGS: Normal breath sounds bilaterally, no wheezing, rales,rhonchi or crepitation. No use of accessory muscles of respiration.  CARDIOVASCULAR: S1, S2 normal. No murmurs, rubs, or gallops.  ABDOMEN: Soft, nontender, nondistended. Bowel sounds present. No organomegaly or mass.  EXTREMITIES: No pedal edema, cyanosis, or clubbing.  NEUROLOGIC: Cranial nerves II through XII are intact. Muscle strength 5/5 in all extremities. Sensation intact. Gait not checked.  PSYCHIATRIC: The patient is alert and oriented x 3.  SKIN: No obvious rash, lesion, or ulcer.   Data Review     CBC w Diff: Lab Results  Component Value Date   WBC 7.1 09/26/2016   HGB 10.9 (L) 09/26/2016   HGB 13.0 12/20/2013   HCT 33.4 (L) 09/26/2016   HCT 39.8 12/20/2013   PLT 219 09/26/2016   PLT 210 12/20/2013   LYMPHOPCT 27 09/26/2016   LYMPHOPCT 33.0 12/20/2013   MONOPCT 10 09/26/2016   MONOPCT 9.9 12/20/2013   EOSPCT 3 09/26/2016   EOSPCT 4.0 12/20/2013   BASOPCT 1 09/26/2016   BASOPCT 0.3 12/20/2013   CMP: Lab Results  Component Value Date   NA 140 09/23/2016   NA 139 03/27/2015   NA 144 10/15/2013   K 4.8 09/23/2016  K 3.9 10/15/2013   CL 108 09/23/2016   CL 113 (H) 10/15/2013   CO2 24 09/23/2016   CO2 23 10/15/2013   BUN 41 (H) 09/23/2016   BUN 43 (H) 03/27/2015   BUN 12 10/15/2013   CREATININE 1.71 (H) 09/23/2016   CREATININE 1.10 10/15/2013   PROT 5.4 (L) 10/22/2015   PROT 6.2 (L) 10/11/2013   ALBUMIN 3.5 10/22/2015   ALBUMIN 2.8 (L) 10/11/2013   BILITOT 0.5 10/22/2015   BILITOT 0.2 10/11/2013    ALKPHOS 60 10/22/2015   ALKPHOS 69 10/11/2013   AST 15 10/22/2015   AST 30 10/11/2013   ALT 17 10/22/2015   ALT 30 10/11/2013  .  Micro Results No results found for this or any previous visit (from the past 240 hour(s)).      Code Status Orders        Start     Ordered   09/21/16 1244  Do not attempt resuscitation (DNR)  Continuous    Question Answer Comment  In the event of cardiac or respiratory ARREST Do not call a "code blue"   In the event of cardiac or respiratory ARREST Do not perform Intubation, CPR, defibrillation or ACLS   In the event of cardiac or respiratory ARREST Use medication by any route, position, wound care, and other measures to relive pain and suffering. May use oxygen, suction and manual treatment of airway obstruction as needed for comfort.      09/21/16 1245    Code Status History    Date Active Date Inactive Code Status Order ID Comments User Context   10/01/2015 11:44 AM 10/02/2015  7:48 AM Full Code [id number]  Bettey Costa, MD Inpatient    Advance Directive Documentation     Most Recent Value  Type of Advance Directive  Living will, Healthcare Power of Attorney  Pre-existing out of facility DNR order (yellow form or pink MOST form)  -  "MOST" Form in Place?  -          Follow-up Information    Leonel Ramsay, MD Follow up in 1 week(s).   Specialty:  Infectious Diseases Why:  hopsital f/u and check cbc Contact information: [place] [address] [phone]           Discharge Medications   Allergies as of 09/26/2016      Reactions   Morphine And Related Shortness Of Breath   Atenolol    Other reaction(s): Other (See Comments) Decreased heart rate   Codeine    Rash, difficulty breathing, nausea.   Nsaids    Other reaction(s): Unknown   Rofecoxib    Other reaction(s): Unknown   Sulfa Antibiotics    Other reaction(s): Unknown      Medication List    STOP taking these medications   aspirin 81 MG  tablet   furosemide 20 MG tablet Commonly known as:  LASIX     TAKE these medications   cloNIDine 0.3 mg/24hr patch Commonly known as:  CATAPRES - Dosed in mg/24 hr Place 0.3 mg onto the skin once a week.   cloNIDine 0.1 MG tablet Commonly known as:  CATAPRES Take 1 tablet (0.1 mg total) by mouth 2 (two) times daily as needed. Take for pressure >160   clopidogrel 75 MG tablet Commonly known as:  PLAVIX Take 1 tablet (75 mg total) by mouth daily.   doxazosin 8 MG tablet Commonly known as:  CARDURA Take 1 tablet (8 mg total) by mouth  2 (two) times daily.   famotidine 20 MG tablet Commonly known as:  PEPCID Take 1 tablet (20 mg total) by mouth 2 (two) times daily.   glimepiride 4 MG tablet Commonly known as:  AMARYL Take 4 mg by mouth daily before breakfast.   ipratropium-albuterol 0.5-2.5 (3) MG/3ML Soln Commonly known as:  DUONEB Take 3 mLs by nebulization every 4 (four) hours as needed (shortness of breath/wheezing.).   levothyroxine 125 MCG tablet Commonly known as:  SYNTHROID, LEVOTHROID Take 125 mcg by mouth daily.   losartan 100 MG tablet Commonly known as:  COZAAR Take 100 mg by mouth daily.   metFORMIN 1000 MG tablet Commonly known as:  GLUCOPHAGE Take 1,000 mg by mouth 2 (two) times daily with a meal.   multivitamin tablet Take 1 tablet by mouth daily.   oxybutynin 5 MG tablet Commonly known as:  DITROPAN Take 5 mg by mouth as needed.   pantoprazole 40 MG tablet Commonly known as:  PROTONIX Take 40 mg by mouth daily.   senna 8.6 MG Tabs tablet Commonly known as:  SENOKOT Take 1 tablet (8.6 mg total) by mouth daily as needed for mild constipation.   simvastatin 40 MG tablet Commonly known as:  ZOCOR Take 1 tablet (40 mg total) by mouth at bedtime.   SYMBICORT 160-4.5 MCG/ACT inhaler Generic drug:  budesonide-formoterol Inhale 2 puffs into the lungs 2 (two) times daily.   vitamin B-12 1000 MCG tablet Commonly known as:  CYANOCOBALAMIN Take  1 tablet by mouth daily.          Total Time in preparing paper work, data evaluation and todays exam - 35 minutes  Dustin Flock M.D on 09/26/2016 at 2:57 PM  [place]  [phone] [place] at [facility]

## 2016-09-26 NOTE — Anesthesia Post-op Follow-up Note (Cosign Needed)
Anesthesia QCDR form completed.        

## 2016-09-29 ENCOUNTER — Encounter: Payer: Self-pay | Admitting: Gastroenterology

## 2016-09-29 ENCOUNTER — Ambulatory Visit: Payer: Medicare Other

## 2016-09-29 ENCOUNTER — Ambulatory Visit: Payer: Medicare Other | Admitting: Oncology

## 2016-09-29 ENCOUNTER — Inpatient Hospital Stay: Payer: Medicare Other | Attending: Oncology

## 2016-09-29 ENCOUNTER — Other Ambulatory Visit: Payer: Medicare Other

## 2016-09-29 ENCOUNTER — Telehealth: Payer: Self-pay | Admitting: Oncology

## 2016-09-29 ENCOUNTER — Inpatient Hospital Stay: Payer: Medicare Other

## 2016-09-29 ENCOUNTER — Other Ambulatory Visit: Payer: Self-pay

## 2016-09-29 DIAGNOSIS — D649 Anemia, unspecified: Secondary | ICD-10-CM

## 2016-09-29 DIAGNOSIS — D500 Iron deficiency anemia secondary to blood loss (chronic): Secondary | ICD-10-CM | POA: Diagnosis not present

## 2016-09-29 LAB — FERRITIN: FERRITIN: 21 ng/mL (ref 11–307)

## 2016-09-29 LAB — CBC WITH DIFFERENTIAL/PLATELET
BASOS PCT: 0 %
Basophils Absolute: 0 10*3/uL (ref 0–0.1)
EOS ABS: 0.2 10*3/uL (ref 0–0.7)
Eosinophils Relative: 2 %
HEMATOCRIT: 27.6 % — AB (ref 35.0–47.0)
HEMOGLOBIN: 9.1 g/dL — AB (ref 12.0–16.0)
LYMPHS ABS: 1.3 10*3/uL (ref 1.0–3.6)
Lymphocytes Relative: 17 %
MCH: 28 pg (ref 26.0–34.0)
MCHC: 33.1 g/dL (ref 32.0–36.0)
MCV: 84.5 fL (ref 80.0–100.0)
MONOS PCT: 10 %
Monocytes Absolute: 0.8 10*3/uL (ref 0.2–0.9)
NEUTROS ABS: 5.2 10*3/uL (ref 1.4–6.5)
NEUTROS PCT: 71 %
Platelets: 214 10*3/uL (ref 150–440)
RBC: 3.27 MIL/uL — AB (ref 3.80–5.20)
RDW: 16.4 % — ABNORMAL HIGH (ref 11.5–14.5)
WBC: 7.4 10*3/uL (ref 3.6–11.0)

## 2016-09-29 LAB — SAMPLE TO BLOOD BANK

## 2016-09-29 LAB — IRON AND TIBC
IRON: 18 ug/dL — AB (ref 28–170)
Saturation Ratios: 6 % — ABNORMAL LOW (ref 10.4–31.8)
TIBC: 292 ug/dL (ref 250–450)
UIBC: 274 ug/dL

## 2016-09-29 LAB — SURGICAL PATHOLOGY

## 2016-09-29 NOTE — Telephone Encounter (Signed)
Patient came in for lab and infusion apt.  Pt stated she was release from hospital Friday and had 2 iron infusions while there.  I called and spoke with Anderson Malta, ext [phone]. Per (RN) Anderson Malta with Dr.Finnegan to check patient in for lab only.

## 2016-10-02 ENCOUNTER — Telehealth: Payer: Self-pay

## 2016-10-02 NOTE — Telephone Encounter (Signed)
-----   Message from [name], MD sent at 10/01/2016 12:02 PM EDT ----- Inform pre cancerous polyps taken out- wouldn't recommend future colonoscopy for surveillance due to age

## 2016-10-08 DIAGNOSIS — J441 Chronic obstructive pulmonary disease with (acute) exacerbation: Secondary | ICD-10-CM | POA: Diagnosis not present

## 2016-10-08 DIAGNOSIS — I10 Essential (primary) hypertension: Secondary | ICD-10-CM | POA: Diagnosis not present

## 2016-10-08 DIAGNOSIS — E7801 Familial hypercholesterolemia: Secondary | ICD-10-CM | POA: Diagnosis not present

## 2016-10-08 DIAGNOSIS — R195 Other fecal abnormalities: Secondary | ICD-10-CM | POA: Diagnosis not present

## 2016-10-08 DIAGNOSIS — I2510 Atherosclerotic heart disease of native coronary artery without angina pectoris: Secondary | ICD-10-CM | POA: Diagnosis not present

## 2016-10-08 DIAGNOSIS — D500 Iron deficiency anemia secondary to blood loss (chronic): Secondary | ICD-10-CM | POA: Diagnosis not present

## 2016-10-08 DIAGNOSIS — E118 Type 2 diabetes mellitus with unspecified complications: Secondary | ICD-10-CM | POA: Diagnosis not present

## 2016-10-08 DIAGNOSIS — E079 Disorder of thyroid, unspecified: Secondary | ICD-10-CM | POA: Diagnosis not present

## 2016-10-14 ENCOUNTER — Ambulatory Visit (INDEPENDENT_AMBULATORY_CARE_PROVIDER_SITE_OTHER): Payer: Medicare Other | Admitting: Nurse Practitioner

## 2016-10-14 ENCOUNTER — Encounter: Payer: Self-pay | Admitting: Nurse Practitioner

## 2016-10-14 VITALS — BP 156/50 | HR 63 | Ht 62.0 in | Wt 209.0 lb

## 2016-10-14 DIAGNOSIS — I25119 Atherosclerotic heart disease of native coronary artery with unspecified angina pectoris: Secondary | ICD-10-CM | POA: Diagnosis not present

## 2016-10-14 DIAGNOSIS — I10 Essential (primary) hypertension: Secondary | ICD-10-CM | POA: Diagnosis not present

## 2016-10-14 DIAGNOSIS — E782 Mixed hyperlipidemia: Secondary | ICD-10-CM | POA: Diagnosis not present

## 2016-10-14 DIAGNOSIS — R0609 Other forms of dyspnea: Secondary | ICD-10-CM | POA: Diagnosis not present

## 2016-10-14 DIAGNOSIS — D509 Iron deficiency anemia, unspecified: Secondary | ICD-10-CM

## 2016-10-14 DIAGNOSIS — I209 Angina pectoris, unspecified: Secondary | ICD-10-CM

## 2016-10-14 DIAGNOSIS — I6523 Occlusion and stenosis of bilateral carotid arteries: Secondary | ICD-10-CM

## 2016-10-14 NOTE — Progress Notes (Signed)
Office Visit    Patient Name: Amy [name] Date of Encounter: 10/14/2016  Primary Care Provider:  Leonel Ramsay, MD Primary Cardiologist:  Johnny Bridge, MD   Chief Complaint    83 year old female with a history of coronary artery disease status post prior LAD stenting, carotid arterial disease,HFpEF, hypertension, hyperlipidemia, diabetes, obesity, hypothyroidism, iron deficiency anemia, melena, and chronic dyspnea, who presents for follow-up after recent hospitalization related to hypotension, anemia, and GI bleed.  Past Medical History    Past Medical History:  Diagnosis Date  . Bladder incontinence   . CAD (coronary artery disease)    a. cardiac cath 05/2009: proximal LAD 90% stenosis s/p PCI/Xience 2.75 x 12 mm DES, mid LAD 40%, diagonal 40%, proximal LCx 40% followed by 40% LCx lesion, 30%-40%-30% lesion noted in the RCA with distal RCA 30% and 25% lesions;  b.  12/2011 Lexiscan MV: no ischemia, breast attenuation artifact, normal EF-->Low risk.  . Carotid arterial disease w/ R Carotid Bruit ([place])    a. 08/2016 Carotid U/S: 40-59% bilat ICA stenosis - f/u 1 yr.  . Chronic diastolic (congestive) heart failure ([place])    a. Echo 09/2015: EF 55-60% w/ Grade 1 DD, sev Ca2+ MV annulus, mildly dil LA.  Marland Kitchen Chronic Dyspnea on exertion   . CKD (chronic kidney disease) stage 3, GFR 30-59 ml/min   . Degenerative arthritis of knee    bilateral knees  . Diabetes mellitus    Type II  . GIB (gastrointestinal bleeding)    a. 08/2016 Admission w/ presyncope/anemia/melena-->req Transfusion-->endo ok, colonoscopy w/ polyps but no source of bleeding (most likely diverticular).  . Hiatal hernia   . Hypertension   . Iron deficiency   . Menopausal symptoms   . Morbid obesity ([place])   . Thyroid disease    hypothyroidism   Past Surgical History:  Procedure Laterality Date  . COLONOSCOPY  2015  . COLONOSCOPY WITH PROPOFOL N/A 09/25/2016   Procedure: COLONOSCOPY WITH PROPOFOL;  Surgeon:  [name], MD;  Location: [facility] ENDOSCOPY;  Service: Gastroenterology;  Laterality: N/A;  . COLONOSCOPY WITH PROPOFOL N/A 09/26/2016   Procedure: COLONOSCOPY WITH PROPOFOL;  Surgeon: [name], MD;  Location: [facility] ENDOSCOPY;  Service: Gastroenterology;  Laterality: N/A;  . ESOPHAGOGASTRODUODENOSCOPY (EGD) WITH PROPOFOL N/A 09/25/2016   Procedure: ESOPHAGOGASTRODUODENOSCOPY (EGD) WITH PROPOFOL;  Surgeon: [name], MD;  Location: [facility] ENDOSCOPY;  Service: Gastroenterology;  Laterality: N/A;  . REPLACEMENT TOTAL KNEE BILATERAL    . TOTAL VAGINAL HYSTERECTOMY     ovarian mass, not cancerous  . UPPER GI ENDOSCOPY  2015    Allergies  Allergies  Allergen Reactions  . Morphine And Related Shortness Of Breath  . Atenolol     Other reaction(s): Other (See Comments) Decreased heart rate  . Codeine     Rash, difficulty breathing, nausea.  . Nsaids     Other reaction(s): Unknown  . Rofecoxib     Other reaction(s): Unknown  . Sulfa Antibiotics     Other reaction(s): Unknown    History of Present Illness    83 year old female with a history of coronary artery disease status post prior LAD stenting, carotid arterial disease,HFpEF, hypertension, hyperlipidemia, diabetes, obesity, hypothyroidism, iron deficiency anemia, melena, and chronic dyspnea, who presents for follow-up after recent hospitalization related to hypotension, anemia, and GI bleed.  Cardiac history dates back to 2011, at which time she underwent diagnostic catheterization revealing severe proximal LAD disease and this was successfully treated with a drug-eluting stent.  Subsequent stress  test in 2013 was nonischemic.  Echocardiogram in 09/2015 showed normal LV function with grade 1 diastolic dysfunction.  She was recently admitted to [facility] in the setting of melena and hypotension. She did fall out of her bed at home and presumed that she lost consciousness prior to falling. Once on the floor, she was very weak and  eventually was able to get a neighbor's attention and EMS was called. In the hospital, she was found to be anemic and also hypotensive. She did require transfusions. She was seen by GI and underwent EGD and also colonoscopy without any active bleeding found.During hospitalization, aspirin and Plavix were held the Plavix was subsequently resumed. She has since followed up with her primary care provider and hemoglobin was stable at 9.8.  She says that she has chronic dyspnea on exertion that has worsened over the past year. She has not had any chest pain. She does not recall what her prior anginal equivalent was. Her volume has been stable and she has not had any significant orthopnea, PND, early satiety, or weight gain.    Home Medications    Prior to Admission medications   Medication Sig Start Date End Date Taking? Authorizing Provider  cloNIDine (CATAPRES - DOSED IN MG/24 HR) 0.3 mg/24hr patch Place 0.3 mg onto the skin once a week.  12/11/14  Yes [provider]  cloNIDine (CATAPRES) 0.1 MG tablet Take 1 tablet (0.1 mg total) by mouth 2 (two) times daily as needed. Take for pressure >160 09/03/16  Yes Gollan, Kathlene November, MD  clopidogrel (PLAVIX) 75 MG tablet Take 1 tablet (75 mg total) by mouth daily. 09/27/16  Yes Dustin Flock, MD  doxazosin (CARDURA) 8 MG tablet Take 1 tablet (8 mg total) by mouth 2 (two) times daily. 11/14/15  Yes Minna Merritts, MD  furosemide (LASIX) 20 MG tablet  08/22/16  Yes [provider]  glimepiride (AMARYL) 4 MG tablet Take 4 mg by mouth daily before breakfast.   Yes [provider]  ipratropium-albuterol (DUONEB) 0.5-2.5 (3) MG/3ML SOLN Take 3 mLs by nebulization every 4 (four) hours as needed (shortness of breath/wheezing.). 10/11/15  Yes Sainani, Belia Heman, MD  levothyroxine (SYNTHROID, LEVOTHROID) 125 MCG tablet Take 125 mcg by mouth daily.   Yes [provider]  losartan (COZAAR) 100 MG tablet Take 100 mg by mouth daily. 09/05/16   Yes [provider]  metFORMIN (GLUCOPHAGE) 1000 MG tablet Take 1,000 mg by mouth 2 (two) times daily with a meal.   Yes [provider]  Multiple Vitamin (MULTIVITAMIN) tablet Take 1 tablet by mouth daily.   Yes [provider]  oxybutynin (DITROPAN) 5 MG tablet Take 5 mg by mouth as needed.    Yes [provider]  pantoprazole (PROTONIX) 40 MG tablet Take 40 mg by mouth daily.  08/22/14  Yes [provider]  senna (SENOKOT) 8.6 MG TABS tablet Take 1 tablet (8.6 mg total) by mouth daily as needed for mild constipation. 10/11/15  Yes Henreitta Leber, MD  simvastatin (ZOCOR) 40 MG tablet Take 1 tablet (40 mg total) by mouth at bedtime. 08/22/16  Yes Minna Merritts, MD  SYMBICORT 160-4.5 MCG/ACT inhaler Inhale 2 puffs into the lungs 2 (two) times daily.  11/15/14  Yes [provider]  vitamin B-12 (CYANOCOBALAMIN) 1000 MCG tablet Take 1 tablet by mouth daily. 06/27/16 06/27/17 Yes [provider]    Review of Systems    She has chronic DOE. She denies chest pain,  palpitations, pnd, orthopnea, n, v, dizziness, syncope, edema, weight gain, or early satiety.  All other systems reviewed and are otherwise negative except as noted above.  Physical Exam    VS:  BP (!) 156/50 (BP Location: Left Arm, Patient Position: Sitting, Cuff Size: Normal)   Pulse 63   Ht 5\' 2"  (1.575 m)   Wt 209 lb (94.8 kg)   BMI 38.23 kg/m  , BMI Body mass index is 38.23 kg/m. GEN: Well nourished, well developed, in no acute distress.  HEENT: normal.  Neck: Supple, no JVD, R>L bilat carotid bruits, no masses. Cardiac: RRR,2/6 SEM @ USB - heard throughout, no rubs, or gallops. No clubbing, cyanosis, edema.  Radials/DP/PT 2+ and equal bilaterally.  Respiratory:  Respirations regular and unlabored, clear to auscultation bilaterally. GI: Soft, nontender, nondistended, BS + x 4. MS: no deformity or atrophy. Skin: warm and dry, no rash. Neuro:  Strength and sensation  are intact. Psych: Normal affect.  Accessory Clinical Findings    ECG - regular sinus rhythm, 63, right bundle branch block, no acute changes.  Labs drawn August 15 found in care everywhere: Hemoglobin 9.8, hematocrit 31.1, WBC 7.0, platelets 264 Sodium 143, potassium 4.7, chloride 106, CO2 28.7, BUN 34, creatinine 1.5, glucose 180 Alkaline phosphatase 60, total bilirubin 0.3, AST 12, ALT 10 Albumin 3.8 Hemoglobin A1c 6.3 TSH 2.703 Total cholesterol 156, tragus was 264, HDL 28, LDL 74  Assessment & Plan    1.  Dyspnea on exertion: This is chronic but she says today that this has progressed over the past year. Echo last year showed normal LV function. Her last stress test was in 2013. She has not been having any chest pain and does not remember what her anginal equivalent was. Her weight has been stable and she has no significant evidence of volume overload on exam. She was recently admitted for anemia and hemoglobin and hematocrit were recently found to be stable by labs at primary care.I suspect dyspnea is multifactorial as she has also been followed by pulmonology. She is interested in further cardiac evaluation as symptoms have worsened. I will arrange for a Lexiscan Myoview to assess for ischemia since his been 5 years since her last study and it is unclear to what extent dyspnea might be an anginal equivalent.  I will also arrange for repeat echocardiogram in the setting of systolic murmur.  2. Coronary artery disease: Progressive dyspnea over the past year. No chest pain. Plan for stress  As above. Continue Plavix, ARB, and statin therapy. Aspirin is currently on hold in the setting of recent GI bleed.  3. Essential hypertension: Blood pressure is elevated today. She reports that it is typically more normal.  She was 132/40 recent primary care visit. Continue current regimen.  4.  Stage III chronic kidney disease: creatinine 1.5 on August 15, which is stable.  5. Hyperlipidemia:  Recent LDL 74. Continue statin therapy.  6. Iron deficiency Anemia/melena:  S/p recent admission and transfusion.  H/H recently stable. No further melena.  7.  Disposition: Follow-up stress testing and echo w/ office follow-up in one month.  Murray Hodgkins, NP 10/14/2016, 12:55 PM

## 2016-10-14 NOTE — Patient Instructions (Addendum)
Testing/Procedures: Your physician has requested that you have an echocardiogram. Echocardiography is a painless test that uses sound waves to create images of your heart. It provides your doctor with information about the size and shape of your heart and how well your heart's chambers and valves are working. This procedure takes approximately one hour. There are no restrictions for this procedure.  Amy [name]  Your caregiver has ordered a Stress Test with nuclear imaging. The purpose of this test is to evaluate the blood supply to your heart muscle. This procedure is referred to as a "Non-Invasive Stress Test." This is because other than having an IV started in your vein, nothing is inserted or "invades" your body. Cardiac stress tests are done to find areas of poor blood flow to the heart by determining the extent of coronary artery disease (CAD). Some patients exercise on a treadmill, which naturally increases the blood flow to your heart, while others who are  unable to walk on a treadmill due to physical limitations have a pharmacologic/chemical stress agent called Lexiscan . This medicine will mimic walking on a treadmill by temporarily increasing your coronary blood flow.   Please note: these test may take anywhere between 2-4 hours to complete  PLEASE REPORT TO [facility] AT THE FIRST DESK WILL DIRECT YOU WHERE TO GO  Date of Procedure:_Thursday August 30, 2018__  Arrival Time for Procedure:__Arrive at 07:45AM___  Instructions regarding medication:   _X___ : Hold diabetes & fluid medication morning of procedure    PLEASE NOTIFY THE OFFICE AT LEAST 24 HOURS IN ADVANCE IF YOU ARE UNABLE TO KEEP YOUR APPOINTMENT.  [phone] AND  PLEASE NOTIFY NUCLEAR MEDICINE AT [facility] AT LEAST 24 HOURS IN ADVANCE IF YOU ARE UNABLE TO KEEP YOUR APPOINTMENT. [phone]  How to prepare for your Myoview test:  1. Do not eat or drink after midnight 2. No caffeine for  24 hours prior to test 3. No smoking 24 hours prior to test. 4. Your medication may be taken with water.  If your doctor stopped a medication because of this test, do not take that medication. 5. Ladies, please do not wear dresses.  Skirts or pants are appropriate. Please wear a short sleeve shirt. 6. No perfume, cologne or lotion. 7. Wear comfortable walking shoes. No heels!   Follow-Up: Your physician recommends that you schedule a follow-up appointment in: 3 months with Dr. Rockey Situ.  It was a pleasure seeing you today here in the office. Please do not hesitate to give [place] a call back if you have any further questions. Amy [name], BSN     Echocardiogram An echocardiogram, or echocardiography, uses sound waves (ultrasound) to produce an image of your heart. The echocardiogram is simple, painless, obtained within a short period of time, and offers valuable information to your health care provider. The images from an echocardiogram can provide information such as:  Evidence of coronary artery disease (CAD).  Heart size.  Heart muscle function.  Heart valve function.  Aneurysm detection.  Evidence of a past heart attack.  Fluid buildup around the heart.  Heart muscle thickening.  Assess heart valve function.  Tell a health care provider about:  Any allergies you have.  All medicines you are taking, including vitamins, herbs, eye drops, creams, and over-the-counter medicines.  Any problems you or family members have had with anesthetic medicines.  Any blood disorders you have.  Any surgeries you have had.  Any medical conditions  you have.  Whether you are pregnant or may be pregnant. What happens before the procedure? No special preparation is needed. Eat and drink normally. What happens during the procedure?  In order to produce an image of your heart, gel will be applied to your chest and a wand-like tool (transducer) will be moved over your  chest. The gel will help transmit the sound waves from the transducer. The sound waves will harmlessly bounce off your heart to allow the heart images to be captured in real-time motion. These images will then be recorded.  You may need an IV to receive a medicine that improves the quality of the pictures. What happens after the procedure? You may return to your normal schedule including diet, activities, and medicines, unless your health care provider tells you otherwise. This information is not intended to replace advice given to you by your health care provider. Make sure you discuss any questions you have with your health care provider. Document Released: 02/08/2000 Document Revised: 09/29/2015 Document Reviewed: 10/18/2012 Elsevier Interactive Patient Education  2017 [place]. Cardiac Nuclear Scan A cardiac nuclear scan is a test that measures blood flow to the heart when a person is resting and when he or she is exercising. The test looks for problems such as:  Not enough blood reaching a portion of the heart.  The heart muscle not working normally.  You may need this test if:  You have heart disease.  You have had abnormal lab results.  You have had heart surgery or angioplasty.  You have chest pain.  You have shortness of breath.  In this test, a radioactive dye (tracer) is injected into your bloodstream. After the tracer has traveled to your heart, an imaging device is used to measure how much of the tracer is absorbed by or distributed to various areas of your heart. This procedure is usually done at a hospital and takes 2-4 hours. Tell a health care provider about:  Any allergies you have.  All medicines you are taking, including vitamins, herbs, eye drops, creams, and over-the-counter medicines.  Any problems you or family members have had with the use of anesthetic medicines.  Any blood disorders you have.  Any surgeries you have had.  Any medical conditions  you have.  Whether you are pregnant or may be pregnant. What are the risks? Generally, this is a safe procedure. However, problems may occur, including:  Serious chest pain and heart attack. This is only a risk if the stress portion of the test is done.  Rapid heartbeat.  Sensation of warmth in your chest. This usually passes quickly.  What happens before the procedure?  Ask your health care provider about changing or stopping your regular medicines. This is especially important if you are taking diabetes medicines or blood thinners.  Remove your jewelry on the day of the procedure. What happens during the procedure?  An IV tube will be inserted into one of your veins.  Your health care provider will inject a small amount of radioactive tracer through the tube.  You will wait for 20-40 minutes while the tracer travels through your bloodstream.  Your heart activity will be monitored with an electrocardiogram (ECG).  You will lie down on an exam table.  Images of your heart will be taken for about 15-20 minutes.  You may be asked to exercise on a treadmill or stationary bike. While you exercise, your heart's activity will be monitored with an ECG, and your blood pressure  will be checked. If you are unable to exercise, you may be given a medicine to increase blood flow to parts of your heart.  When blood flow to your heart has peaked, a tracer will again be injected through the IV tube.  After 20-40 minutes, you will get back on the exam table and have more images taken of your heart.  When the procedure is over, your IV tube will be removed. The procedure may vary among health care providers and hospitals. Depending on the type of tracer used, scans may need to be repeated 3-4 hours later. What happens after the procedure?  Unless your health care provider tells you otherwise, you may return to your normal schedule, including diet, activities, and medicines.  Unless your  health care provider tells you otherwise, you may increase your fluid intake. This will help flush the contrast dye from your body. Drink enough fluid to keep your urine clear or pale yellow.  It is up to you to get your test results. Ask your health care provider, or the department that is doing the test, when your results will be ready. Summary  A cardiac nuclear scan measures the blood flow to the heart when a person is resting and when he or she is exercising.  You may need this test if you are at risk for heart disease.  Tell your health care provider if you are pregnant.  Unless your health care provider tells you otherwise, increase your fluid intake. This will help flush the contrast dye from your body. Drink enough fluid to keep your urine clear or pale yellow. This information is not intended to replace advice given to you by your health care provider. Make sure you discuss any questions you have with your health care provider. Document Released: 03/07/2004 Document Revised: 02/13/2016 Document Reviewed: 01/19/2013 Elsevier Interactive Patient Education  2017 [employer].

## 2016-10-17 DIAGNOSIS — H353211 Exudative age-related macular degeneration, right eye, with active choroidal neovascularization: Secondary | ICD-10-CM | POA: Diagnosis not present

## 2016-10-22 ENCOUNTER — Telehealth: Payer: Self-pay | Admitting: *Deleted

## 2016-10-22 ENCOUNTER — Other Ambulatory Visit: Payer: Self-pay

## 2016-10-22 ENCOUNTER — Ambulatory Visit (INDEPENDENT_AMBULATORY_CARE_PROVIDER_SITE_OTHER): Payer: Medicare Other

## 2016-10-22 DIAGNOSIS — Z79899 Other long term (current) drug therapy: Secondary | ICD-10-CM

## 2016-10-22 DIAGNOSIS — R0609 Other forms of dyspnea: Secondary | ICD-10-CM | POA: Diagnosis not present

## 2016-10-22 MED ORDER — FUROSEMIDE 20 MG PO TABS: 20.0000 mg | ORAL_TABLET | Freq: Every day | ORAL | 3 refills | Status: DC

## 2016-10-22 NOTE — Telephone Encounter (Signed)
-----   Message from Rogelia Mire, NP sent at 10/22/2016  2:30 PM EDT ----- Echo shows nl LV fxn with a fairly stiff heart and high filling pressures.  Blood pressure was high @ office visit, though she noted that it was more normal @ home.  She has lasix on her list, but it does not say how often she takes it.  If she is not taking, based on high filling pressures, she should begin taking once daily and will need a f/u bmet next week.  If she is already taking once daily, she should take 40 mg daily and will need a f/u bmet next week.

## 2016-10-22 NOTE — Telephone Encounter (Signed)
Results called to pt. Pt verbalized understanding of results and plan of care. She will be out of town all next week but will go in the morning on 11/03/16 for the lab work. Order entered. Patient said she was only taking her lasix about once a week. She verbalized understanding to take furosemide 20 mg by mouth once a day. Rx sent to pharmacy.

## 2016-10-23 ENCOUNTER — Telehealth: Payer: Self-pay | Admitting: Cardiology

## 2016-10-23 ENCOUNTER — Telehealth: Payer: Self-pay | Admitting: Cardiovascular Disease

## 2016-10-23 ENCOUNTER — Ambulatory Visit: Payer: Medicare Other

## 2016-10-23 DIAGNOSIS — E162 Hypoglycemia, unspecified: Secondary | ICD-10-CM | POA: Diagnosis not present

## 2016-10-23 DIAGNOSIS — R42 Dizziness and giddiness: Secondary | ICD-10-CM | POA: Diagnosis not present

## 2016-10-23 NOTE — Telephone Encounter (Signed)
Spoke w/ pt's daughter.  She reports that pt feel this am due to low BG and was unable to come in for myoview. Advised her to f/u w/ PCP or endocrinology regarding DM and to hold off on rescheduling myoview until she is feeling better, as she will need to be fasting for this procedure. She is appreciative and will call back when pt is ready to set this up.

## 2016-10-23 NOTE — Telephone Encounter (Signed)
Amy [name]'s daughter, Theda Sers, called to report that Amy [name], who was scheduled for a stress test at 7:45, had dizziness and fell this morning. She called EMS who arrived and assessed her. She had a low blood sugar of 60 and was given D50. Her BP at Amy time was normal per Amy daughter but went up to 214/102 before EMS left. They felt that she was not injured and was stable and did not transport to ED. Amy [name] is still a little dizzy but is up walking around without a problem.  I advised Amy pt to call Dr. Elwyn Reach office when they open as she may need to be seen. Amy daughter agrees.   Daune Perch, AGNP-C [facility] 10/23/2016  7:30 AM

## 2016-10-23 NOTE — Telephone Encounter (Signed)
See Previous phone note  Pt was to have a myoview this morning but called in stating pt is not going to make it she had a fall and low Blood sugar Please call patient they need to reschedule the Myoview

## 2016-10-28 ENCOUNTER — Other Ambulatory Visit: Payer: Medicare Other

## 2016-10-28 ENCOUNTER — Ambulatory Visit: Payer: Medicare Other | Admitting: Oncology

## 2016-10-28 ENCOUNTER — Telehealth: Payer: Self-pay | Admitting: Oncology

## 2016-10-28 NOTE — Telephone Encounter (Signed)
Rschd & conf with patient, per Dr Woodfin Ganja on [facility] 11/05/16. MF

## 2016-10-31 ENCOUNTER — Telehealth: Payer: Self-pay | Admitting: Nurse Practitioner

## 2016-10-31 NOTE — Telephone Encounter (Signed)
Spoke with patient and rescheduled lexiscan for 11/06/16 at 7:15 am. Reviewed pre-procedural instructions and patient verbalized understanding.

## 2016-10-31 NOTE — Telephone Encounter (Signed)
Patient had to cancel NM study due to fall.  In wq for Rescheduling

## 2016-11-03 ENCOUNTER — Other Ambulatory Visit
Admission: RE | Admit: 2016-11-03 | Discharge: 2016-11-03 | Disposition: A | Discharge status: Home / Self Care | Payer: Medicare Other | Source: Ambulatory Visit | Attending: Nurse Practitioner | Admitting: Nurse Practitioner

## 2016-11-03 ENCOUNTER — Other Ambulatory Visit: Payer: Self-pay

## 2016-11-03 DIAGNOSIS — Z79899 Other long term (current) drug therapy: Secondary | ICD-10-CM | POA: Diagnosis not present

## 2016-11-03 DIAGNOSIS — E875 Hyperkalemia: Secondary | ICD-10-CM

## 2016-11-03 LAB — BASIC METABOLIC PANEL
ANION GAP: 10 (ref 5–15)
BUN: 43 mg/dL — AB (ref 6–20)
CALCIUM: 9 mg/dL (ref 8.9–10.3)
CO2: 22 mmol/L (ref 22–32)
CREATININE: 1.49 mg/dL — AB (ref 0.44–1.00)
Chloride: 109 mmol/L (ref 101–111)
GFR calc Af Amer: 35 mL/min — ABNORMAL LOW (ref >60)
GFR calc non Af Amer: 30 mL/min — ABNORMAL LOW (ref >60)
GLUCOSE: 208 mg/dL — AB (ref 65–99)
Potassium: 5.5 mmol/L — ABNORMAL HIGH (ref 3.5–5.1)
Sodium: 141 mmol/L (ref 135–145)

## 2016-11-04 ENCOUNTER — Other Ambulatory Visit: Payer: Self-pay

## 2016-11-04 DIAGNOSIS — I5033 Acute on chronic diastolic (congestive) heart failure: Secondary | ICD-10-CM

## 2016-11-04 DIAGNOSIS — I482 Chronic atrial fibrillation, unspecified: Secondary | ICD-10-CM

## 2016-11-05 ENCOUNTER — Other Ambulatory Visit: Payer: Medicare Other

## 2016-11-05 ENCOUNTER — Ambulatory Visit: Payer: Medicare Other | Admitting: Oncology

## 2016-11-06 ENCOUNTER — Other Ambulatory Visit
Admission: RE | Admit: 2016-11-06 | Discharge: 2016-11-06 | Disposition: A | Discharge status: Home / Self Care | Payer: Medicare Other | Source: Ambulatory Visit | Attending: Nurse Practitioner | Admitting: Nurse Practitioner

## 2016-11-06 ENCOUNTER — Other Ambulatory Visit: Payer: Medicare Other

## 2016-11-06 DIAGNOSIS — I482 Chronic atrial fibrillation, unspecified: Secondary | ICD-10-CM

## 2016-11-06 DIAGNOSIS — E875 Hyperkalemia: Secondary | ICD-10-CM

## 2016-11-06 DIAGNOSIS — I5033 Acute on chronic diastolic (congestive) heart failure: Secondary | ICD-10-CM

## 2016-11-06 LAB — CBC
HEMATOCRIT: 28.7 % — AB (ref 35.0–47.0)
HEMOGLOBIN: 9.4 g/dL — AB (ref 12.0–16.0)
MCH: 27.3 pg (ref 26.0–34.0)
MCHC: 32.7 g/dL (ref 32.0–36.0)
MCV: 83.7 fL (ref 80.0–100.0)
Platelets: 213 10*3/uL (ref 150–440)
RBC: 3.44 MIL/uL — AB (ref 3.80–5.20)
RDW: 18.2 % — ABNORMAL HIGH (ref 11.5–14.5)
WBC: 6.9 10*3/uL (ref 3.6–11.0)

## 2016-11-06 LAB — BASIC METABOLIC PANEL
Anion gap: 8 (ref 5–15)
BUN: 37 mg/dL — ABNORMAL HIGH (ref 6–20)
CHLORIDE: 112 mmol/L — AB (ref 101–111)
CO2: 23 mmol/L (ref 22–32)
Calcium: 9.2 mg/dL (ref 8.9–10.3)
Creatinine, Ser: 1.51 mg/dL — ABNORMAL HIGH (ref 0.44–1.00)
GFR calc Af Amer: 35 mL/min — ABNORMAL LOW (ref >60)
GFR calc non Af Amer: 30 mL/min — ABNORMAL LOW (ref >60)
GLUCOSE: 133 mg/dL — AB (ref 65–99)
POTASSIUM: 5.6 mmol/L — AB (ref 3.5–5.1)
Sodium: 143 mmol/L (ref 135–145)

## 2016-11-07 ENCOUNTER — Other Ambulatory Visit: Payer: Self-pay

## 2016-11-07 DIAGNOSIS — H353222 Exudative age-related macular degeneration, left eye, with inactive choroidal neovascularization: Secondary | ICD-10-CM | POA: Diagnosis not present

## 2016-11-07 DIAGNOSIS — E875 Hyperkalemia: Secondary | ICD-10-CM

## 2016-11-07 MED ORDER — LOSARTAN POTASSIUM 50 MG PO TABS: 50.0000 mg | ORAL_TABLET | Freq: Every day | ORAL | 3 refills | Status: DC

## 2016-11-11 ENCOUNTER — Ambulatory Visit
Admission: RE | Admit: 2016-11-11 | Discharge: 2016-11-11 | Disposition: A | Discharge status: Home / Self Care | Payer: Medicare Other | Source: Ambulatory Visit | Attending: Nurse Practitioner | Admitting: Nurse Practitioner

## 2016-11-11 DIAGNOSIS — R0609 Other forms of dyspnea: Secondary | ICD-10-CM

## 2016-11-11 LAB — NM MYOCAR MULTI W/SPECT W/WALL MOTION / EF
CSEPHR: 45 %
LVDIAVOL: 92 mL (ref 46–106)
LVSYSVOL: 39 mL
Peak HR: 60 {beats}/min
Rest HR: 41 {beats}/min
TID: 0.96

## 2016-11-11 MED ORDER — REGADENOSON 0.4 MG/5ML IV SOLN
0.4000 mg | Freq: Once | INTRAVENOUS | Status: AC
  Administered 2016-11-11: 0.4 mg via INTRAVENOUS

## 2016-11-11 MED ORDER — TECHNETIUM TC 99M TETROFOSMIN IV KIT
32.1200 | PACK | Freq: Once | INTRAVENOUS | Status: AC | PRN
  Administered 2016-11-11: 32.12 via INTRAVENOUS

## 2016-11-11 MED ORDER — TECHNETIUM TC 99M TETROFOSMIN IV KIT
12.9900 | PACK | Freq: Once | INTRAVENOUS | Status: AC | PRN
  Administered 2016-11-11: 12.99 via INTRAVENOUS

## 2016-11-11 NOTE — Progress Notes (Signed)
[place]  [phone] [phone]  ID: Amy [name] OB: October 31, 1927  MR#: [id number]  CHY#:[id number]  Patient Care Team: Leonel Ramsay, MD as PCP - General (Infectious Diseases) Rockey Situ Kathlene November, MD as Consulting Physician (Cardiology)  CHIEF COMPLAINT: Iron deficiency anemia secondary to chronic blood loss.  INTERVAL HISTORY: Patient returns to clinic for repeat laboratory work, further evaluation, and consideration of additional Feraheme. She reports increased weakness and fatigue. She also has chronic shortness of breath. She has no neurologic complaints. She denies any recent fevers or illnesses. She denies any chest pain, cough, or hemoptysis.. She denies any nausea, vomiting, constipation, or diarrhea. She has no melena or hematochezia. She has no urinary complaints. Patient offers no further specific complaints today.  REVIEW OF SYSTEMS:   Review of Systems  Constitutional: Positive for malaise/fatigue. Negative for fever and weight loss.  HENT: Negative.   Respiratory: Positive for shortness of breath. Negative for cough and hemoptysis.   Cardiovascular: Negative.  Negative for chest pain and leg swelling.  Gastrointestinal: Negative.  Negative for abdominal pain, blood in stool and melena.  Genitourinary: Negative.  Negative for hematuria.  Musculoskeletal: Negative.   Skin: Negative.  Negative for rash.  Neurological: Positive for weakness. Negative for sensory change.  Psychiatric/Behavioral: Negative.  The patient is not nervous/anxious.     As per HPI. Otherwise, a complete review of systems is negative.  PAST MEDICAL HISTORY: Past Medical History:  Diagnosis Date  . Bladder incontinence   . CAD (coronary artery disease)    a. cardiac cath 05/2009: proximal LAD 90% stenosis s/p PCI/Xience 2.75 x 12 mm DES, mid LAD 40%, diagonal 40%, proximal LCx 40% followed by 40% LCx lesion, 30%-40%-30% lesion noted in the RCA with  distal RCA 30% and 25% lesions;  b.  12/2011 Lexiscan MV: no ischemia, breast attenuation artifact, normal EF-->Low risk.  . Carotid arterial disease w/ R Carotid Bruit ([place])    a. 08/2016 Carotid U/S: 40-59% bilat ICA stenosis - f/u 1 yr.  . Chronic diastolic (congestive) heart failure ([place])    a. Echo 09/2015: EF 55-60% w/ Grade 1 DD, sev Ca2+ MV annulus, mildly dil LA.  Marland Kitchen Chronic Dyspnea on exertion   . CKD (chronic kidney disease) stage 3, GFR 30-59 ml/min   . Degenerative arthritis of knee    bilateral knees  . Diabetes mellitus    Type II  . GIB (gastrointestinal bleeding)    a. 08/2016 Admission w/ presyncope/anemia/melena-->req Transfusion-->endo ok, colonoscopy w/ polyps but no source of bleeding (most likely diverticular).  . Hiatal hernia   . Hypertension   . Iron deficiency   . Menopausal symptoms   . Morbid obesity ([place])   . Thyroid disease    hypothyroidism    PAST SURGICAL HISTORY: Past Surgical History:  Procedure Laterality Date  . COLONOSCOPY  2015  . COLONOSCOPY WITH PROPOFOL N/A 09/25/2016   Procedure: COLONOSCOPY WITH PROPOFOL;  Surgeon: [name], MD;  Location: [facility] ENDOSCOPY;  Service: Gastroenterology;  Laterality: N/A;  . COLONOSCOPY WITH PROPOFOL N/A 09/26/2016   Procedure: COLONOSCOPY WITH PROPOFOL;  Surgeon: [name], MD;  Location: [facility] ENDOSCOPY;  Service: Gastroenterology;  Laterality: N/A;  . ESOPHAGOGASTRODUODENOSCOPY (EGD) WITH PROPOFOL N/A 09/25/2016   Procedure: ESOPHAGOGASTRODUODENOSCOPY (EGD) WITH PROPOFOL;  Surgeon: [name], MD;  Location: [facility] ENDOSCOPY;  Service: Gastroenterology;  Laterality: N/A;  . REPLACEMENT TOTAL KNEE BILATERAL    . TOTAL VAGINAL HYSTERECTOMY     ovarian mass, not cancerous  .  UPPER GI ENDOSCOPY  2015    FAMILY HISTORY Family History  Problem Relation Age of Onset  . Breast cancer Mother 21       ADVANCED DIRECTIVES:    HEALTH MAINTENANCE: Social History  Substance Use Topics  . Smoking status: Never  Smoker  . Smokeless tobacco: Never Used  . Alcohol use No     Colonoscopy:  PAP:  Bone density:  Lipid panel:  Allergies  Allergen Reactions  . Morphine And Related Shortness Of Breath  . Atenolol     Other reaction(s): Other (See Comments) Decreased heart rate  . Codeine     Rash, difficulty breathing, nausea.  . Nsaids     Other reaction(s): Unknown  . Rofecoxib     Other reaction(s): Unknown  . Sulfa Antibiotics     Other reaction(s): Unknown    Current Outpatient Prescriptions  Medication Sig Dispense Refill  . cloNIDine (CATAPRES - DOSED IN MG/24 HR) 0.3 mg/24hr patch Place 0.3 mg onto the skin once a week.     . cloNIDine (CATAPRES) 0.1 MG tablet Take 1 tablet (0.1 mg total) by mouth 2 (two) times daily as needed. Take for pressure >160 180 tablet 3  . clopidogrel (PLAVIX) 75 MG tablet Take 1 tablet (75 mg total) by mouth daily. 90 tablet 3  . doxazosin (CARDURA) 8 MG tablet Take 1 tablet (8 mg total) by mouth 2 (two) times daily. 180 tablet 3  . furosemide (LASIX) 20 MG tablet Take 1 tablet (20 mg total) by mouth daily. 90 tablet 3  . glimepiride (AMARYL) 4 MG tablet Take 4 mg by mouth daily before breakfast.    . ipratropium-albuterol (DUONEB) 0.5-2.5 (3) MG/3ML SOLN Take 3 mLs by nebulization every 4 (four) hours as needed (shortness of breath/wheezing.). 360 mL   . levothyroxine (SYNTHROID, LEVOTHROID) 125 MCG tablet Take 125 mcg by mouth daily.    Marland Kitchen losartan (COZAAR) 50 MG tablet Take 1 tablet (50 mg total) by mouth daily. 30 tablet 3  . metFORMIN (GLUCOPHAGE) 1000 MG tablet Take 1,000 mg by mouth 2 (two) times daily with a meal.    . Multiple Vitamin (MULTIVITAMIN) tablet Take 1 tablet by mouth daily.    Marland Kitchen oxybutynin (DITROPAN) 5 MG tablet Take 5 mg by mouth as needed.     . senna (SENOKOT) 8.6 MG TABS tablet Take 1 tablet (8.6 mg total) by mouth daily as needed for mild constipation. 120 each 0  . simvastatin (ZOCOR) 40 MG tablet Take 1 tablet (40 mg total) by  mouth at bedtime. 90 tablet 3  . SYMBICORT 160-4.5 MCG/ACT inhaler Inhale 2 puffs into the lungs 2 (two) times daily.     . pantoprazole (PROTONIX) 40 MG tablet Take 40 mg by mouth daily.     . vitamin B-12 (CYANOCOBALAMIN) 1000 MCG tablet Take 1 tablet by mouth daily.     No current facility-administered medications for this visit.     OBJECTIVE: Vitals:   11/13/16 1125  BP: (!) 173/52  Pulse: (!) 55  Resp: 18  Temp: 97.6 F (36.4 C)     Body mass index is 38.3 kg/m.    ECOG FS:2 - Symptomatic, <50% confined to bed  General: Well-developed, well-nourished, no acute distress. Eyes: Pink conjunctiva, anicteric sclera. Lungs: Scattered wheezing. Heart: Regular rate and rhythm. No rubs, murmurs, or gallops. Abdomen: Soft, nontender, nondistended. No organomegaly noted, normoactive bowel sounds. Musculoskeletal: No edema, cyanosis, or clubbing. Neuro: Alert, answering all questions appropriately. Cranial nerves grossly  intact. Skin: No rashes or petechiae noted. Psych: Normal affect.  LAB RESULTS:  Lab Results  Component Value Date   NA 141 11/13/2016   K 5.1 11/13/2016   CL 109 11/13/2016   CO2 21 (L) 11/13/2016   GLUCOSE 109 (H) 11/13/2016   BUN 32 (H) 11/13/2016   CREATININE 1.33 (H) 11/13/2016   CALCIUM 9.2 11/13/2016   PROT 5.4 (L) 10/22/2015   ALBUMIN 3.5 10/22/2015   AST 15 10/22/2015   ALT 17 10/22/2015   ALKPHOS 60 10/22/2015   BILITOT 0.5 10/22/2015   GFRNONAA 35 (L) 11/13/2016   GFRAA 40 (L) 11/13/2016    Lab Results  Component Value Date   WBC 8.0 11/13/2016   NEUTROABS 4.9 11/13/2016   HGB 9.0 (L) 11/13/2016   HCT 27.2 (L) 11/13/2016   MCV 83.4 11/13/2016   PLT 256 11/13/2016   Lab Results  Component Value Date   IRON 30 11/13/2016   TIBC 350 11/13/2016   IRONPCTSAT 9 (L) 11/13/2016    Lab Results  Component Value Date   FERRITIN 14 11/13/2016    STUDIES: Nm Myocar Multi W/spect W/wall Motion / Ef  Result Date: 11/11/2016  Low risk  study without evidence of ischemia.  There is a small in size, moderate in severity, fixed apical defect that most likely represents artifact (apical thinning and attenuation) and less likely scar.  The left ventricular ejection fraction is normal (60%).  Resting EKG shows marked sinus bradycardia (HR 41 bpm) with junctional rhythm immediately after regadenoson injection. Final EKG shows return of normal sinus rhythm (HR 60 bpm).     ASSESSMENT:  Iron deficiency anemia secondary to chronic blood loss, shortness of breath.   PLAN:    1. Iron deficiency anemia secondary to chronic blood loss: Previously, patient was reported to be intolerant of oral iron supplementation. She also had an EGD and colonoscopy in May 2015 for heme positive stools but only revealed esophagitis and tubular adenomas in her colon. Patient's hemoglobin and iron stores have trended down and she is symptomatic. Proceed with 510 mg IV Feraheme in 1 and 2 weeks. Patient will then return to clinic in 2 months with repeat laboratory work and further evaluation. 2. Pulmonary nodules: Patient had CT scan in August 2017 which recommended a CT in approximately one year. Repeat in 2 months in follow-up as above. 3. Renal insufficiency: Mild, monitor.  Patient expressed understanding and was in agreement with this plan. She also understands that She can call clinic at any time with any questions, concerns, or complaints.    Lloyd Huger, MD   11/18/2016 11:13 AM

## 2016-11-12 ENCOUNTER — Other Ambulatory Visit: Payer: Self-pay

## 2016-11-12 DIAGNOSIS — D509 Iron deficiency anemia, unspecified: Secondary | ICD-10-CM

## 2016-11-13 ENCOUNTER — Inpatient Hospital Stay (HOSPITAL_BASED_OUTPATIENT_CLINIC_OR_DEPARTMENT_OTHER): Payer: Medicare Other | Admitting: Oncology

## 2016-11-13 ENCOUNTER — Inpatient Hospital Stay: Payer: Medicare Other | Attending: Oncology

## 2016-11-13 VITALS — BP 173/52 | HR 55 | Temp 97.6°F | Resp 18 | Wt 209.4 lb

## 2016-11-13 DIAGNOSIS — E1122 Type 2 diabetes mellitus with diabetic chronic kidney disease: Secondary | ICD-10-CM | POA: Diagnosis not present

## 2016-11-13 DIAGNOSIS — E875 Hyperkalemia: Secondary | ICD-10-CM

## 2016-11-13 DIAGNOSIS — E039 Hypothyroidism, unspecified: Secondary | ICD-10-CM | POA: Insufficient documentation

## 2016-11-13 DIAGNOSIS — D500 Iron deficiency anemia secondary to blood loss (chronic): Secondary | ICD-10-CM

## 2016-11-13 DIAGNOSIS — N183 Chronic kidney disease, stage 3 (moderate): Secondary | ICD-10-CM | POA: Insufficient documentation

## 2016-11-13 DIAGNOSIS — I2510 Atherosclerotic heart disease of native coronary artery without angina pectoris: Secondary | ICD-10-CM | POA: Insufficient documentation

## 2016-11-13 DIAGNOSIS — Z803 Family history of malignant neoplasm of breast: Secondary | ICD-10-CM

## 2016-11-13 DIAGNOSIS — E669 Obesity, unspecified: Secondary | ICD-10-CM | POA: Insufficient documentation

## 2016-11-13 DIAGNOSIS — I129 Hypertensive chronic kidney disease with stage 1 through stage 4 chronic kidney disease, or unspecified chronic kidney disease: Secondary | ICD-10-CM | POA: Diagnosis not present

## 2016-11-13 DIAGNOSIS — Z7902 Long term (current) use of antithrombotics/antiplatelets: Secondary | ICD-10-CM | POA: Diagnosis not present

## 2016-11-13 DIAGNOSIS — R0602 Shortness of breath: Secondary | ICD-10-CM

## 2016-11-13 DIAGNOSIS — I10 Essential (primary) hypertension: Secondary | ICD-10-CM | POA: Insufficient documentation

## 2016-11-13 DIAGNOSIS — Z8719 Personal history of other diseases of the digestive system: Secondary | ICD-10-CM

## 2016-11-13 DIAGNOSIS — K449 Diaphragmatic hernia without obstruction or gangrene: Secondary | ICD-10-CM | POA: Insufficient documentation

## 2016-11-13 DIAGNOSIS — D509 Iron deficiency anemia, unspecified: Secondary | ICD-10-CM

## 2016-11-13 DIAGNOSIS — Z7984 Long term (current) use of oral hypoglycemic drugs: Secondary | ICD-10-CM | POA: Insufficient documentation

## 2016-11-13 DIAGNOSIS — R531 Weakness: Secondary | ICD-10-CM

## 2016-11-13 DIAGNOSIS — R5383 Other fatigue: Secondary | ICD-10-CM | POA: Diagnosis not present

## 2016-11-13 DIAGNOSIS — I5032 Chronic diastolic (congestive) heart failure: Secondary | ICD-10-CM | POA: Insufficient documentation

## 2016-11-13 DIAGNOSIS — Z79899 Other long term (current) drug therapy: Secondary | ICD-10-CM | POA: Diagnosis not present

## 2016-11-13 DIAGNOSIS — R918 Other nonspecific abnormal finding of lung field: Secondary | ICD-10-CM | POA: Diagnosis not present

## 2016-11-13 LAB — CBC WITH DIFFERENTIAL/PLATELET
BASOS ABS: 0 10*3/uL (ref 0–0.1)
Basophils Relative: 0 %
EOS ABS: 0.1 10*3/uL (ref 0–0.7)
Eosinophils Relative: 2 %
HCT: 27.2 % — ABNORMAL LOW (ref 35.0–47.0)
HEMOGLOBIN: 9 g/dL — AB (ref 12.0–16.0)
Lymphocytes Relative: 29 %
Lymphs Abs: 2.3 10*3/uL (ref 1.0–3.6)
MCH: 27.6 pg (ref 26.0–34.0)
MCHC: 33.1 g/dL (ref 32.0–36.0)
MCV: 83.4 fL (ref 80.0–100.0)
MONOS PCT: 9 %
Monocytes Absolute: 0.7 10*3/uL (ref 0.2–0.9)
NEUTROS ABS: 4.9 10*3/uL (ref 1.4–6.5)
NEUTROS PCT: 60 %
PLATELETS: 256 10*3/uL (ref 150–440)
RBC: 3.26 MIL/uL — ABNORMAL LOW (ref 3.80–5.20)
RDW: 18.3 % — ABNORMAL HIGH (ref 11.5–14.5)
WBC: 8 10*3/uL (ref 3.6–11.0)

## 2016-11-13 LAB — BASIC METABOLIC PANEL
ANION GAP: 11 (ref 5–15)
BUN: 32 mg/dL — ABNORMAL HIGH (ref 6–20)
CO2: 21 mmol/L — ABNORMAL LOW (ref 22–32)
CREATININE: 1.33 mg/dL — AB (ref 0.44–1.00)
Calcium: 9.2 mg/dL (ref 8.9–10.3)
Chloride: 109 mmol/L (ref 101–111)
GFR, EST AFRICAN AMERICAN: 40 mL/min — AB (ref >60)
GFR, EST NON AFRICAN AMERICAN: 35 mL/min — AB (ref >60)
Glucose, Bld: 109 mg/dL — ABNORMAL HIGH (ref 65–99)
Potassium: 5.1 mmol/L (ref 3.5–5.1)
SODIUM: 141 mmol/L (ref 135–145)

## 2016-11-13 LAB — SAMPLE TO BLOOD BANK

## 2016-11-13 LAB — IRON AND TIBC
IRON: 30 ug/dL (ref 28–170)
SATURATION RATIOS: 9 % — AB (ref 10.4–31.8)
TIBC: 350 ug/dL (ref 250–450)
UIBC: 320 ug/dL

## 2016-11-13 LAB — FERRITIN: Ferritin: 14 ng/mL (ref 11–307)

## 2016-11-19 DIAGNOSIS — I4891 Unspecified atrial fibrillation: Secondary | ICD-10-CM | POA: Diagnosis not present

## 2016-11-19 DIAGNOSIS — Z23 Encounter for immunization: Secondary | ICD-10-CM | POA: Diagnosis not present

## 2016-11-19 DIAGNOSIS — E538 Deficiency of other specified B group vitamins: Secondary | ICD-10-CM | POA: Diagnosis not present

## 2016-11-19 DIAGNOSIS — E7801 Familial hypercholesterolemia: Secondary | ICD-10-CM | POA: Diagnosis not present

## 2016-11-19 DIAGNOSIS — I2510 Atherosclerotic heart disease of native coronary artery without angina pectoris: Secondary | ICD-10-CM | POA: Diagnosis not present

## 2016-11-19 DIAGNOSIS — E1165 Type 2 diabetes mellitus with hyperglycemia: Secondary | ICD-10-CM | POA: Diagnosis not present

## 2016-11-19 DIAGNOSIS — I10 Essential (primary) hypertension: Secondary | ICD-10-CM | POA: Diagnosis not present

## 2016-11-19 DIAGNOSIS — R195 Other fecal abnormalities: Secondary | ICD-10-CM | POA: Diagnosis not present

## 2016-11-19 DIAGNOSIS — E162 Hypoglycemia, unspecified: Secondary | ICD-10-CM | POA: Diagnosis not present

## 2016-11-20 ENCOUNTER — Inpatient Hospital Stay: Payer: Medicare Other

## 2016-11-20 VITALS — BP 156/52 | HR 57 | Temp 97.3°F | Resp 18

## 2016-11-20 DIAGNOSIS — R5383 Other fatigue: Secondary | ICD-10-CM | POA: Diagnosis not present

## 2016-11-20 DIAGNOSIS — R531 Weakness: Secondary | ICD-10-CM | POA: Diagnosis not present

## 2016-11-20 DIAGNOSIS — D500 Iron deficiency anemia secondary to blood loss (chronic): Secondary | ICD-10-CM

## 2016-11-20 DIAGNOSIS — N183 Chronic kidney disease, stage 3 (moderate): Secondary | ICD-10-CM | POA: Diagnosis not present

## 2016-11-20 DIAGNOSIS — R918 Other nonspecific abnormal finding of lung field: Secondary | ICD-10-CM | POA: Diagnosis not present

## 2016-11-20 DIAGNOSIS — I129 Hypertensive chronic kidney disease with stage 1 through stage 4 chronic kidney disease, or unspecified chronic kidney disease: Secondary | ICD-10-CM | POA: Diagnosis not present

## 2016-11-20 MED ORDER — SODIUM CHLORIDE 0.9 % IV SOLN
Freq: Once | INTRAVENOUS | Status: AC
  Administered 2016-11-20: 12:00:00 via INTRAVENOUS
  Filled 2016-11-20: qty 1000

## 2016-11-20 MED ORDER — SODIUM CHLORIDE 0.9 % IV SOLN
510.0000 mg | Freq: Once | INTRAVENOUS | Status: AC
  Administered 2016-11-20: 510 mg via INTRAVENOUS
  Filled 2016-11-20: qty 17

## 2016-11-24 ENCOUNTER — Ambulatory Visit (INDEPENDENT_AMBULATORY_CARE_PROVIDER_SITE_OTHER): Payer: Medicare Other | Admitting: Podiatry

## 2016-11-24 DIAGNOSIS — M79676 Pain in unspecified toe(s): Secondary | ICD-10-CM | POA: Diagnosis not present

## 2016-11-24 DIAGNOSIS — E119 Type 2 diabetes mellitus without complications: Secondary | ICD-10-CM

## 2016-11-24 DIAGNOSIS — M2041 Other hammer toe(s) (acquired), right foot: Secondary | ICD-10-CM

## 2016-11-24 DIAGNOSIS — L608 Other nail disorders: Secondary | ICD-10-CM

## 2016-11-24 DIAGNOSIS — B351 Tinea unguium: Secondary | ICD-10-CM

## 2016-11-24 DIAGNOSIS — M2042 Other hammer toe(s) (acquired), left foot: Secondary | ICD-10-CM

## 2016-11-24 NOTE — Progress Notes (Signed)
Complaint:  Visit Type: Patient returns to my office for continued preventative foot care services. Complaint: Patient states" my nails have grown long and thick and become painful to walk and wear shoes" Patient has been diagnosed with DM with no foot complications. The patient presents for preventative foot care services. No changes to ROS  Podiatric Exam: Vascular: dorsalis pedis and posterior tibial pulses are palpable bilateral. Capillary return is immediate. Temperature gradient is WNL. Skin turgor WNL  Sensorium: Normal Semmes Weinstein monofilament test. Normal tactile sensation bilaterally. Nail Exam: Pt has thick disfigured discolored nails with subungual debris noted bilateral entire nail hallux through fifth toenails Ulcer Exam: There is no evidence of ulcer or pre-ulcerative changes or infection. Orthopedic Exam: Muscle tone and strength are WNL. No limitations in general ROM. No crepitus or effusions noted. Foot type and digits show no abnormalities. Bony prominences are unremarkable. Skin: No Porokeratosis. No infection or ulcers  Diagnosis:  Onychomycosis, , Pain in right toe, pain in left toes  Treatment & Plan Procedures and Treatment: Consent by patient was obtained for treatment procedures. The patient understood the discussion of treatment and procedures well. All questions were answered thoroughly reviewed. Debridement of mycotic and hypertrophic toenails, 1 through 5 bilateral and clearing of subungual debris. No ulceration, no infection noted.  Return Visit-Office Procedure: Patient instructed to return to the office for a follow up visit 10 weeks  for continued evaluation and treatment.    Gardiner Barefoot DPM

## 2016-11-25 NOTE — Progress Notes (Signed)
* [facility]     Assessment and Plan:  Dyspnea. -The patient's dyspnea is likely multifactorial from cardiomegaly with chronic diastolic congestive heart failure, bronchitis, atelectasis, morbid obesity, deconditioning. --Discussed again today that her breathing issues are chronic, there is no particular "fix" for this. Our focus will be to try to keep her function where it is at, and try to allow her to live independently for as long as possible.  -At previous visits. He was noted that she would benefit from rehabilitation, she completed physical therapy, she was then sent for pulmonary rehabilitation, she has continued with this and feels that it is helping.    Atelectasis/Lung nodule.  -CT of the chest shows atelectasis of the medial segment of the right middle lobe. This is most likely due to external compression, however an endobronchial tumor cannot be ruled out based on the scan. -She was encouraged to use her incentive spirometry and flutter valve. Continue PT.  -Given the presence of lung nodules, atelectasis, paratracheal lymphadenopathy, we could repeat CT however, given advanced age, multiple comorbidites and presence of plavix, she would be at high risk of complications. Also, it is unlikely that any intervention are unlikely to prolong her life, therefore will defer bronchoscopy scanning at this time.   Asthma.  -Continue symbicort bid.  --s/p Prevnar-13 vaccine.  Hiatal hernia/intrathoracic stomach. -Patient has a significant hiatal hernia, which is likely contributing to dyspnea and atelectasis. -The patient is likely at high risk of aspiration, continue to treat with antireflux measures. -Continue Protonix.   CHF with cardiomegaly. -Patient has significant cardiomegaly noted on CT of the chest, this likely contributing to atelectasis.  Obesity. -BMI = 46 -Likely contributing to dyspnea, weight loss would be beneficial for her  dyspnea. -Obesity is likely contributing to atelectasis due to external compression from chest wall and diaphragm.   Advanced Care Planning 11/26/16:  Pt reaffirms that she would not want to undergo chemotherapy, a CT scan has been ordered, she is agreement with this.  She thinks that if something happened to her she would not want to be put on life support.      Date: 11/25/2016  MRN# [id number] Amy [name] [birth year]   Amy [name] is a 83 y.o. old female seen in follow up for chief complaint of  Chief Complaint  Patient presents with  . Shortness of Breath    SOB worse than usual: chest tightness:     HPI:   Patient is a 83 year old female, with  bronchitis and congestive heart failure, chronic debility. She notes that her breathing has continued to decline, she gets short winded with minimal activity. She lives by herself, we have referred to palliative care but she tells me that she is no longer enrolled in this.  She uses symbicort once or twice per day but often forgets the 2nd dose. She has a rescue inhaler once or twice per day but not sure that it is doing anything.   CT chest 10/09/15 in comparison with previous images mild RML atelectasis, no specific endobronchial lesion is noted.  This was likely due to atelectasis secondary to immobility from hospitalization. Endobronchial lesion seems unlikely given that the initial CT at the beginning of the hospitalization showed no atelectasis.   desat walk 11/12/15: Baseline oxygen sat today on RA at rest 96% and HR 98. After walking 20 feet pt sat was 97% and HR 108, significant dyspnea, unsteady gait, needed assistance when walking. Took a few minutes for  breathing to recover.   Medication:   Outpatient Encounter Prescriptions as of 11/26/2016  Medication Sig  . amLODipine (NORVASC) 10 MG tablet Take by mouth.  . cloNIDine (CATAPRES - DOSED IN MG/24 HR) 0.3 mg/24hr patch Place 0.3 mg onto the skin once a week.   .  cloNIDine (CATAPRES) 0.1 MG tablet Take 1 tablet (0.1 mg total) by mouth 2 (two) times daily as needed. Take for pressure >160  . clopidogrel (PLAVIX) 75 MG tablet Take 1 tablet (75 mg total) by mouth daily.  Marland Kitchen doxazosin (CARDURA) 8 MG tablet Take 1 tablet (8 mg total) by mouth 2 (two) times daily.  . furosemide (LASIX) 20 MG tablet Take 1 tablet (20 mg total) by mouth daily.  Marland Kitchen glimepiride (AMARYL) 1 MG tablet   . glimepiride (AMARYL) 2 MG tablet   . glimepiride (AMARYL) 4 MG tablet Take 4 mg by mouth daily before breakfast.  . ipratropium-albuterol (DUONEB) 0.5-2.5 (3) MG/3ML SOLN Take 3 mLs by nebulization every 4 (four) hours as needed (shortness of breath/wheezing.).  Marland Kitchen levothyroxine (SYNTHROID, LEVOTHROID) 125 MCG tablet Take 125 mcg by mouth daily.  Marland Kitchen losartan (COZAAR) 50 MG tablet Take 1 tablet (50 mg total) by mouth daily.  . metFORMIN (GLUCOPHAGE) 1000 MG tablet Take 1,000 mg by mouth 2 (two) times daily with a meal.  . Multiple Vitamin (MULTIVITAMIN) tablet Take 1 tablet by mouth daily.  Marland Kitchen oxybutynin (DITROPAN) 5 MG tablet Take 5 mg by mouth as needed.   . pantoprazole (PROTONIX) 40 MG tablet Take 40 mg by mouth daily.   Marland Kitchen senna (SENOKOT) 8.6 MG TABS tablet Take 1 tablet (8.6 mg total) by mouth daily as needed for mild constipation.  . simvastatin (ZOCOR) 40 MG tablet Take 1 tablet (40 mg total) by mouth at bedtime.  . simvastatin (ZOCOR) 40 MG tablet Take by mouth.  . SYMBICORT 160-4.5 MCG/ACT inhaler Inhale 2 puffs into the lungs 2 (two) times daily.   . vitamin B-12 (CYANOCOBALAMIN) 1000 MCG tablet Take 1 tablet by mouth daily.   No facility-administered encounter medications on file as of 11/26/2016.      Allergies:  Morphine and related; Atenolol; Codeine; Nsaids; Rofecoxib; and Sulfa antibiotics  Review of Systems: Gen:  Denies  fever, sweats. HEENT: Denies blurred vision. Cvc:  No dizziness, chest pain or heaviness Resp:   Denies cough or sputum porduction. Gi: Denies  swallowing difficulty, stomach pain. constipation, bowel incontinence Gu:  Denies bladder incontinence, burning urine Ext:   No Joint pain, stiffness. Skin: No skin rash, easy bruising. Endoc:  No polyuria, polydipsia. Psych: No depression, insomnia. Other:  All other systems were reviewed and found to be negative other than what is mentioned in the HPI.   Physical Examination:   VS: BP (!) 158/60 (BP Location: Left Arm, Cuff Size: Normal)   Pulse 67   Resp 18   Ht 5\' 2"  (1.575 m)   Wt 212 lb (96.2 kg)   SpO2 99%   BMI 38.78 kg/m   General Appearance: No distress  Neuro:without focal findings,  speech normal,  HEENT: PERRLA, EOM intact. Pulmonary: normal breath sounds, No wheezing.   CardiovascularNormal S1,S2.  No m/r/g.   Abdomen: Benign, Soft, non-tender. Renal:  No costovertebral tenderness  GU:  Not performed at this time. Endoc: No evident thyromegaly, no signs of acromegaly. Skin:   warm, no rash. Extremities: normal, no cyanosis, clubbing.   LABORATORY PANEL:   CBC No results for input(s): WBC, HGB, HCT, PLT in the  last 168 hours. ------------------------------------------------------------------------------------------------------------------  Chemistries  No results for input(s): NA, K, CL, CO2, GLUCOSE, BUN, CREATININE, CALCIUM, MG, AST, ALT, ALKPHOS, BILITOT in the last 168 hours.  Invalid input(s): GFRCGP ------------------------------------------------------------------------------------------------------------------  Cardiac Enzymes No results for input(s): TROPONINI in the last 168 hours. ------------------------------------------------------------  RADIOLOGY:   No results found for this or any previous visit. Results for orders placed during the hospital encounter of 12/28/14  DG Chest 2 View   Narrative CLINICAL DATA:  Hemoptysis this morning ; shortness of breath on exertion, history of asthma, chronic CHF, obesity, and diabetes.  EXAM: CHEST   2 VIEW  COMPARISON:  Portable chest x-ray of October 11, 2013  FINDINGS: The lungs are well-expanded. There is no focal infiltrate. The interstitial markings are coarse. The heart is top-normal in size. The pulmonary vascularity is not engorged. There is no pleural effusion. There is a moderate-sized hiatal hernia. There is apical pleural thickening bilaterally. There is mild degenerative disc space narrowing of the mid thoracic spine.  IMPRESSION: Acute on chronic bronchitic change.  There is no evidence of CHF.   Electronically Signed   By: David  Martinique M.D.   On: 12/28/2014 13:21    ------------------------------------------------------------------------------------------------------------------  Thank  you for allowing [facility] to assist in the care of your patient. Our recommendations are noted above.  Please contact us if we can be of further service.   Marda Stalker, MD.  [facility] Office Number: [phone]  Patricia Pesa, M.D.  Vilinda Boehringer, M.D.  Merton Border, M.D  11/25/2016

## 2016-11-26 ENCOUNTER — Encounter: Payer: Self-pay | Admitting: Internal Medicine

## 2016-11-26 ENCOUNTER — Ambulatory Visit (INDEPENDENT_AMBULATORY_CARE_PROVIDER_SITE_OTHER): Payer: Medicare Other | Admitting: Internal Medicine

## 2016-11-26 VITALS — BP 158/60 | HR 67 | Resp 18 | Ht 62.0 in | Wt 212.0 lb

## 2016-11-26 DIAGNOSIS — J9610 Chronic respiratory failure, unspecified whether with hypoxia or hypercapnia: Secondary | ICD-10-CM | POA: Diagnosis not present

## 2016-11-26 DIAGNOSIS — J9811 Atelectasis: Secondary | ICD-10-CM | POA: Diagnosis not present

## 2016-11-26 DIAGNOSIS — I6523 Occlusion and stenosis of bilateral carotid arteries: Secondary | ICD-10-CM

## 2016-11-26 NOTE — Patient Instructions (Addendum)
Continue your current medications. Use symbicort twice per day. You can use your rescue inhaler more often, as often as 3 or 4 times per day.

## 2016-11-27 ENCOUNTER — Inpatient Hospital Stay: Payer: Medicare Other | Attending: Oncology

## 2016-11-27 VITALS — BP 148/66 | HR 59 | Temp 95.2°F | Resp 20

## 2016-11-27 DIAGNOSIS — D500 Iron deficiency anemia secondary to blood loss (chronic): Secondary | ICD-10-CM | POA: Insufficient documentation

## 2016-11-27 DIAGNOSIS — Z79899 Other long term (current) drug therapy: Secondary | ICD-10-CM | POA: Insufficient documentation

## 2016-11-27 MED ORDER — SODIUM CHLORIDE 0.9 % IV SOLN
510.0000 mg | Freq: Once | INTRAVENOUS | Status: AC
  Administered 2016-11-27: 510 mg via INTRAVENOUS
  Filled 2016-11-27: qty 17

## 2016-11-27 MED ORDER — SODIUM CHLORIDE 0.9 % IV SOLN
Freq: Once | INTRAVENOUS | Status: AC
  Administered 2016-11-27: 12:00:00 via INTRAVENOUS
  Filled 2016-11-27: qty 1000

## 2016-12-02 ENCOUNTER — Other Ambulatory Visit: Payer: Self-pay | Admitting: Infectious Diseases

## 2016-12-02 DIAGNOSIS — Z1231 Encounter for screening mammogram for malignant neoplasm of breast: Secondary | ICD-10-CM

## 2016-12-08 DIAGNOSIS — H353222 Exudative age-related macular degeneration, left eye, with inactive choroidal neovascularization: Secondary | ICD-10-CM | POA: Diagnosis not present

## 2016-12-08 DIAGNOSIS — H353211 Exudative age-related macular degeneration, right eye, with active choroidal neovascularization: Secondary | ICD-10-CM | POA: Diagnosis not present

## 2016-12-15 ENCOUNTER — Ambulatory Visit: Payer: Medicare Other | Admitting: Internal Medicine

## 2016-12-22 ENCOUNTER — Ambulatory Visit: Payer: Medicare Other | Admitting: Internal Medicine

## 2016-12-22 DIAGNOSIS — E538 Deficiency of other specified B group vitamins: Secondary | ICD-10-CM | POA: Diagnosis not present

## 2016-12-27 ENCOUNTER — Other Ambulatory Visit: Payer: Self-pay | Admitting: Cardiovascular Disease

## 2017-01-05 ENCOUNTER — Ambulatory Visit
Admission: RE | Admit: 2017-01-05 | Discharge: 2017-01-05 | Disposition: A | Discharge status: Home / Self Care | Payer: Medicare Other | Source: Ambulatory Visit | Attending: Infectious Diseases | Admitting: Infectious Diseases

## 2017-01-05 DIAGNOSIS — Z1231 Encounter for screening mammogram for malignant neoplasm of breast: Secondary | ICD-10-CM | POA: Diagnosis not present

## 2017-01-08 ENCOUNTER — Ambulatory Visit: Payer: Medicare Other | Admitting: Cardiovascular Disease

## 2017-01-12 ENCOUNTER — Ambulatory Visit: Payer: Medicare Other

## 2017-01-13 ENCOUNTER — Inpatient Hospital Stay: Payer: Medicare Other | Attending: Oncology

## 2017-01-13 ENCOUNTER — Ambulatory Visit
Admission: RE | Admit: 2017-01-13 | Discharge: 2017-01-13 | Disposition: A | Discharge status: Home / Self Care | Payer: Medicare Other | Source: Ambulatory Visit | Attending: Oncology | Admitting: Oncology

## 2017-01-13 DIAGNOSIS — R918 Other nonspecific abnormal finding of lung field: Secondary | ICD-10-CM | POA: Diagnosis not present

## 2017-01-13 DIAGNOSIS — K449 Diaphragmatic hernia without obstruction or gangrene: Secondary | ICD-10-CM | POA: Insufficient documentation

## 2017-01-13 DIAGNOSIS — N183 Chronic kidney disease, stage 3 (moderate): Secondary | ICD-10-CM | POA: Insufficient documentation

## 2017-01-13 DIAGNOSIS — I700 Atherosclerosis of aorta: Secondary | ICD-10-CM | POA: Insufficient documentation

## 2017-01-13 DIAGNOSIS — I2510 Atherosclerotic heart disease of native coronary artery without angina pectoris: Secondary | ICD-10-CM | POA: Diagnosis not present

## 2017-01-13 DIAGNOSIS — R0602 Shortness of breath: Secondary | ICD-10-CM | POA: Insufficient documentation

## 2017-01-13 DIAGNOSIS — Z803 Family history of malignant neoplasm of breast: Secondary | ICD-10-CM | POA: Insufficient documentation

## 2017-01-13 DIAGNOSIS — E1122 Type 2 diabetes mellitus with diabetic chronic kidney disease: Secondary | ICD-10-CM | POA: Insufficient documentation

## 2017-01-13 DIAGNOSIS — J90 Pleural effusion, not elsewhere classified: Secondary | ICD-10-CM | POA: Insufficient documentation

## 2017-01-13 DIAGNOSIS — Z79899 Other long term (current) drug therapy: Secondary | ICD-10-CM | POA: Insufficient documentation

## 2017-01-13 DIAGNOSIS — Z7984 Long term (current) use of oral hypoglycemic drugs: Secondary | ICD-10-CM | POA: Insufficient documentation

## 2017-01-13 DIAGNOSIS — E039 Hypothyroidism, unspecified: Secondary | ICD-10-CM | POA: Insufficient documentation

## 2017-01-13 DIAGNOSIS — I129 Hypertensive chronic kidney disease with stage 1 through stage 4 chronic kidney disease, or unspecified chronic kidney disease: Secondary | ICD-10-CM | POA: Insufficient documentation

## 2017-01-13 DIAGNOSIS — Z8543 Personal history of malignant neoplasm of ovary: Secondary | ICD-10-CM | POA: Insufficient documentation

## 2017-01-13 DIAGNOSIS — D500 Iron deficiency anemia secondary to blood loss (chronic): Secondary | ICD-10-CM | POA: Insufficient documentation

## 2017-01-13 DIAGNOSIS — I5032 Chronic diastolic (congestive) heart failure: Secondary | ICD-10-CM | POA: Insufficient documentation

## 2017-01-13 DIAGNOSIS — E669 Obesity, unspecified: Secondary | ICD-10-CM | POA: Insufficient documentation

## 2017-01-13 DIAGNOSIS — M170 Bilateral primary osteoarthritis of knee: Secondary | ICD-10-CM | POA: Insufficient documentation

## 2017-01-13 HISTORY — DX: Disorder of kidney and ureter, unspecified: N28.9

## 2017-01-13 HISTORY — DX: Malignant (primary) neoplasm, unspecified: C80.1

## 2017-01-13 LAB — POCT I-STAT CREATININE: Creatinine, Ser: 1.4 mg/dL — ABNORMAL HIGH (ref 0.44–1.00)

## 2017-01-13 MED ORDER — IOPAMIDOL (ISOVUE-300) INJECTION 61%
60.0000 mL | Freq: Once | INTRAVENOUS | Status: AC | PRN
  Administered 2017-01-13: 60 mL via INTRAVENOUS

## 2017-01-19 ENCOUNTER — Ambulatory Visit: Payer: Medicare Other

## 2017-01-19 ENCOUNTER — Other Ambulatory Visit: Payer: Medicare Other

## 2017-01-20 NOTE — Progress Notes (Signed)
[place]  [phone] [phone]  ID: Amy [name] OB: 07-15-1927  MR#: [id number]  OXB#:[id number]  Patient Care Team: Leonel Ramsay, MD as PCP - General (Infectious Diseases) Rockey Situ Kathlene November, MD as Consulting Physician (Cardiology)  CHIEF COMPLAINT: Iron deficiency anemia secondary to chronic blood loss.  INTERVAL HISTORY: Patient returns to clinic for repeat laboratory work, further evaluation, and consideration of additional Feraheme. She reports no improvement of her weakness and fatigue after receiving Feraheme several months ago.  She continues to have chronic shortness of breath. She has no neurologic complaints. She denies any recent fevers or illnesses. She denies any chest pain, cough, or hemoptysis.  She denies any nausea, vomiting, constipation, or diarrhea. She has no melena or hematochezia. She has no urinary complaints. Patient offers no further specific complaints today.  REVIEW OF SYSTEMS:   Review of Systems  Constitutional: Positive for malaise/fatigue. Negative for fever and weight loss.  HENT: Negative.   Respiratory: Positive for shortness of breath. Negative for cough and hemoptysis.   Cardiovascular: Negative.  Negative for chest pain and leg swelling.  Gastrointestinal: Negative.  Negative for abdominal pain, blood in stool and melena.  Genitourinary: Negative.  Negative for hematuria.  Musculoskeletal: Negative.   Skin: Negative.  Negative for rash.  Neurological: Positive for weakness. Negative for sensory change.  Psychiatric/Behavioral: Negative.  The patient is not nervous/anxious.     As per HPI. Otherwise, a complete review of systems is negative.  PAST MEDICAL HISTORY: Past Medical History:  Diagnosis Date  . Bladder incontinence   . CAD (coronary artery disease)    a. cardiac cath 05/2009: proximal LAD 90% stenosis s/p PCI/Xience 2.75 x 12 mm DES, mid LAD 40%, diagonal 40%, proximal LCx 40%  followed by 40% LCx lesion, 30%-40%-30% lesion noted in the RCA with distal RCA 30% and 25% lesions;  b.  12/2011 Lexiscan MV: no ischemia, breast attenuation artifact, normal EF-->Low risk.  . Cancer ([place])    ovarian  . Carotid arterial disease w/ R Carotid Bruit (HCC)    a. 08/2016 Carotid U/S: 40-59% bilat ICA stenosis - f/u 1 yr.  . Chronic diastolic (congestive) heart failure ([place])    a. Echo 09/2015: EF 55-60% w/ Grade 1 DD, sev Ca2+ MV annulus, mildly dil LA.  Marland Kitchen Chronic Dyspnea on exertion   . CKD (chronic kidney disease) stage 3, GFR 30-59 ml/min (HCC)   . Degenerative arthritis of knee    bilateral knees  . Diabetes mellitus    Type II  . GIB (gastrointestinal bleeding)    a. 08/2016 Admission w/ presyncope/anemia/melena-->req Transfusion-->endo ok, colonoscopy w/ polyps but no source of bleeding (most likely diverticular).  . Hiatal hernia   . Hypertension   . Iron deficiency   . Menopausal symptoms   . Morbid obesity ([place])   . Renal insufficiency   . Thyroid disease    hypothyroidism    PAST SURGICAL HISTORY: Past Surgical History:  Procedure Laterality Date  . COLONOSCOPY  2015  . COLONOSCOPY WITH PROPOFOL N/A 09/25/2016   Procedure: COLONOSCOPY WITH PROPOFOL;  Surgeon: [name], MD;  [facility] END[facility]COPY;  Service: Gastroenterology;  Laterality: N/A;  . COLONOSCOPY WITH PROPOFOL N/A 09/26/2016   Procedure: COLONOSCOPY WITH PROPOFOL;  Surgeon: [name], MD;  Location: [facility] ENDOSCOPY;  Service: Gastroenterology;  Laterality: N/A;  . ESOPHAGOGASTRODUODENOSCOPY (EGD) WITH PROPOFOL N/A 09/25/2016   Procedure: ESOPHAGOGASTRODUODENOSCOPY (EGD) WITH PROPOFOL;  Surgeon: [name], MD;  Location: [facility] ENDOSCOPY;  Service: Gastroenterology;  Laterality: N/A;  . REPLACEMENT TOTAL KNEE BILATERAL    . TOTAL VAGINAL HYSTERECTOMY     ovarian mass, not cancerous  . UPPER GI ENDOSCOPY  2015    FAMILY HISTORY Family History  Problem Relation Age of Onset  . Breast cancer  Mother 77       ADVANCED DIRECTIVES:    HEALTH MAINTENANCE: Social History   Tobacco Use  . Smoking status: Never Smoker  . Smokeless tobacco: Never Used  Substance Use Topics  . Alcohol use: No  . Drug use: No     Colonoscopy:  PAP:  Bone density:  Lipid panel:  Allergies  Allergen Reactions  . Morphine And Related Shortness Of Breath  . Atenolol     Other reaction(s): Other (See Comments) Decreased heart rate  . Codeine     Rash, difficulty breathing, nausea.  . Nsaids     Other reaction(s): Unknown  . Rofecoxib     Other reaction(s): Unknown  . Sulfa Antibiotics     Other reaction(s): Unknown    Current Outpatient Medications  Medication Sig Dispense Refill  . amLODipine (NORVASC) 10 MG tablet Take by mouth.    . cloNIDine (CATAPRES - DOSED IN MG/24 HR) 0.3 mg/24hr patch Place 0.3 mg onto the skin once a week.     . clopidogrel (PLAVIX) 75 MG tablet Take 1 tablet (75 mg total) by mouth daily. 90 tablet 3  . furosemide (LASIX) 20 MG tablet Take 1 tablet (20 mg total) by mouth daily. 90 tablet 3  . ipratropium-albuterol (DUONEB) 0.5-2.5 (3) MG/3ML SOLN Take 3 mLs by nebulization every 4 (four) hours as needed (shortness of breath/wheezing.). 360 mL   . levothyroxine (SYNTHROID, LEVOTHROID) 125 MCG tablet Take 125 mcg by mouth daily.    Marland Kitchen losartan (COZAAR) 50 MG tablet Take 1 tablet (50 mg total) by mouth daily. 30 tablet 3  . metFORMIN (GLUCOPHAGE) 1000 MG tablet Take 1,000 mg by mouth 2 (two) times daily with a meal.    . Multiple Vitamin (MULTIVITAMIN) tablet Take 1 tablet by mouth daily.    Marland Kitchen oxybutynin (DITROPAN) 5 MG tablet Take 5 mg by mouth as needed.     . senna (SENOKOT) 8.6 MG TABS tablet Take 1 tablet (8.6 mg total) by mouth daily as needed for mild constipation. 120 each 0  . simvastatin (ZOCOR) 40 MG tablet Take 1 tablet (40 mg total) by mouth at bedtime. 90 tablet 3  . SYMBICORT 160-4.5 MCG/ACT inhaler Inhale 2 puffs into the lungs 2 (two) times  daily.     Marland Kitchen doxazosin (CARDURA) 8 MG tablet TAKE ONE TABLET TWICE DAILY 180 tablet 0  . glimepiride (AMARYL) 1 MG tablet      No current facility-administered medications for this visit.     OBJECTIVE: Vitals:   01/21/17 1158  BP: (!) 177/71  Pulse: (!) 45  Resp: 20  Temp: (!) 97.4 F (36.3 C)     Body mass index is 39.96 kg/m.    ECOG FS:2 - Symptomatic, <50% confined to bed  General: Well-developed, well-nourished, no acute distress. Eyes: Pink conjunctiva, anicteric sclera. Lungs: Scattered wheezing. Heart: Regular rate and rhythm. No rubs, murmurs, or gallops. Abdomen: Soft, nontender, nondistended. No organomegaly noted, normoactive bowel sounds. Musculoskeletal: No edema, cyanosis, or clubbing. Neuro: Alert, answering all questions appropriately. Cranial nerves grossly intact. Skin: No rashes or petechiae noted. Psych: Normal affect.  LAB RESULTS:  Lab Results  Component Value Date   NA 141 11/13/2016  K 5.1 11/13/2016   CL 109 11/13/2016   CO2 21 (L) 11/13/2016   GLUCOSE 109 (H) 11/13/2016   BUN 32 (H) 11/13/2016   CREATININE 1.40 (H) 01/13/2017   CALCIUM 9.2 11/13/2016   PROT 5.4 (L) 10/22/2015   ALBUMIN 3.5 10/22/2015   AST 15 10/22/2015   ALT 17 10/22/2015   ALKPHOS 60 10/22/2015   BILITOT 0.5 10/22/2015   GFRNONAA 35 (L) 11/13/2016   GFRAA 40 (L) 11/13/2016    Lab Results  Component Value Date   WBC 6.3 01/21/2017   NEUTROABS 4.3 01/21/2017   HGB 9.6 (L) 01/21/2017   HCT 29.8 (L) 01/21/2017   MCV 94.5 01/21/2017   PLT 208 01/21/2017   Lab Results  Component Value Date   IRON 28 01/21/2017   TIBC 317 01/21/2017   IRONPCTSAT 9 (L) 01/21/2017    Lab Results  Component Value Date   FERRITIN 22 01/21/2017    STUDIES: Ct Chest W Contrast  Result Date: 01/13/2017 CLINICAL DATA:  83 year old female with history of ovarian cancer. Pulmonary nodules. Followup study. EXAM: CT CHEST WITH CONTRAST TECHNIQUE: Multidetector CT imaging of the  chest was performed during intravenous contrast administration. CONTRAST:  21mL ISOVUE-300 IOPAMIDOL (ISOVUE-300) INJECTION 61% COMPARISON:  Chest CT 10/09/2015. FINDINGS: Cardiovascular: Heart size is normal. There is no significant pericardial fluid, thickening or pericardial calcification. There is aortic atherosclerosis, as well as atherosclerosis of the great vessels of the mediastinum and the coronary arteries, including calcified atherosclerotic plaque in the left main, left anterior descending, left circumflex and right coronary arteries. Mild calcifications of the aortic valve. Severe calcifications of the mitral annulus. Mediastinum/Nodes: Several prominent but nonenlarged mediastinal and hilar lymph nodes are noted (nonspecific). The moderate-sized hiatal hernia. No axillary lymphadenopathy. Lungs/Pleura: Trace bilateral pleural effusions lying dependently versus areas of mild bilateral pleural thickening. A few scattered pulmonary nodules appear similar in size, number and distribution compared to prior study 10/09/2015. The largest of these in the right lung is in the right upper lobe measuring 8 mm (axial image 28 of series 3), while the largest lesion in the left lung is in the left lower lobe measuring 6 mm (axial image 76 of series 3). No other larger more suspicious appearing pulmonary nodules or masses are noted. No acute consolidative airspace disease. Chronic bilateral apical pleuroparenchymal thickening and architectural distortion (right greater than left), similar to prior studies, most compatible with chronic post infectious or inflammatory scarring. Upper Abdomen: 2.1 cm simple cyst in segment 8 of the liver again noted. Aortic atherosclerosis. Musculoskeletal: There are no aggressive appearing lytic or blastic lesions noted in the visualized portions of the skeleton. IMPRESSION: 1. All previously noted pulmonary nodules appear stable in size, number and distribution compared to the prior  examinations. 2. New trace bilateral pleural effusions versus pleural thickening posteriorly. 3. Aortic atherosclerosis, in addition to left main and 3 vessel coronary artery disease. 4. There are severe calcifications of the mitral annulus. Echocardiographic correlation for evaluation of potential valvular dysfunction may be warranted if clinically indicated. 5. Moderate-sized hiatal hernia. Aortic Atherosclerosis (ICD10-I70.0). Electronically Signed   By: Vinnie Langton M.D.   On: 01/13/2017 10:30   Mm Screening Breast Tomo Bilateral  Result Date: 01/05/2017 CLINICAL DATA:  Screening. EXAM: 2D DIGITAL SCREENING BILATERAL MAMMOGRAM WITH CAD AND ADJUNCT TOMO COMPARISON:  Previous exam(s). ACR Breast Density Category b: There are scattered areas of fibroglandular density. FINDINGS: There are no findings suspicious for malignancy. Images were processed with CAD. IMPRESSION: No mammographic  evidence of malignancy. A result letter of this screening mammogram will be mailed directly to the patient. RECOMMENDATION: Screening mammogram in one year. (Code:SM-B-01Y) BI-RADS CATEGORY  1: Negative. Electronically Signed   By: Franki Cabot M.D.   On: 01/05/2017 11:36    ASSESSMENT:  Iron deficiency anemia secondary to chronic blood loss, shortness of breath.   PLAN:    1. Iron deficiency anemia secondary to chronic blood loss: Previously, patient was reported to be intolerant of oral iron supplementation. She also had an EGD and colonoscopy in May 2015 for heme positive stools but only revealed esophagitis and tubular adenomas in her colon. Patient's hemoglobin and iron stores are unchanged and she is symptomatic.  Return to clinic in 1 and 2 weeks to receive 510 mg IV Feraheme. Patient will then return to clinic in 2 months with repeat laboratory work and further evaluation. 2. Pulmonary nodules: Repeat CT scan from January 13, 2017 revealed stable pulmonary nodules.  No further imaging is necessary.   3.  Renal insufficiency: Mild, monitor.  Patient expressed understanding and was in agreement with this plan. She also understands that She can call clinic at any time with any questions, concerns, or complaints.    Lloyd Huger, MD   01/23/2017 3:48 PM

## 2017-01-21 ENCOUNTER — Encounter: Payer: Self-pay | Admitting: Oncology

## 2017-01-21 ENCOUNTER — Other Ambulatory Visit: Payer: Self-pay

## 2017-01-21 ENCOUNTER — Inpatient Hospital Stay: Payer: Medicare Other

## 2017-01-21 ENCOUNTER — Inpatient Hospital Stay (HOSPITAL_BASED_OUTPATIENT_CLINIC_OR_DEPARTMENT_OTHER): Payer: Medicare Other | Admitting: Oncology

## 2017-01-21 VITALS — BP 177/71 | HR 45 | Temp 97.4°F | Resp 20 | Wt 218.5 lb

## 2017-01-21 DIAGNOSIS — I10 Essential (primary) hypertension: Secondary | ICD-10-CM | POA: Diagnosis not present

## 2017-01-21 DIAGNOSIS — E079 Disorder of thyroid, unspecified: Secondary | ICD-10-CM | POA: Diagnosis not present

## 2017-01-21 DIAGNOSIS — I2510 Atherosclerotic heart disease of native coronary artery without angina pectoris: Secondary | ICD-10-CM

## 2017-01-21 DIAGNOSIS — I700 Atherosclerosis of aorta: Secondary | ICD-10-CM

## 2017-01-21 DIAGNOSIS — I129 Hypertensive chronic kidney disease with stage 1 through stage 4 chronic kidney disease, or unspecified chronic kidney disease: Secondary | ICD-10-CM

## 2017-01-21 DIAGNOSIS — D509 Iron deficiency anemia, unspecified: Secondary | ICD-10-CM

## 2017-01-21 DIAGNOSIS — Z7984 Long term (current) use of oral hypoglycemic drugs: Secondary | ICD-10-CM

## 2017-01-21 DIAGNOSIS — E1165 Type 2 diabetes mellitus with hyperglycemia: Secondary | ICD-10-CM | POA: Diagnosis not present

## 2017-01-21 DIAGNOSIS — K449 Diaphragmatic hernia without obstruction or gangrene: Secondary | ICD-10-CM

## 2017-01-21 DIAGNOSIS — E669 Obesity, unspecified: Secondary | ICD-10-CM | POA: Diagnosis not present

## 2017-01-21 DIAGNOSIS — E1122 Type 2 diabetes mellitus with diabetic chronic kidney disease: Secondary | ICD-10-CM

## 2017-01-21 DIAGNOSIS — R918 Other nonspecific abnormal finding of lung field: Secondary | ICD-10-CM | POA: Diagnosis not present

## 2017-01-21 DIAGNOSIS — D500 Iron deficiency anemia secondary to blood loss (chronic): Secondary | ICD-10-CM

## 2017-01-21 DIAGNOSIS — E039 Hypothyroidism, unspecified: Secondary | ICD-10-CM

## 2017-01-21 DIAGNOSIS — Z803 Family history of malignant neoplasm of breast: Secondary | ICD-10-CM | POA: Diagnosis not present

## 2017-01-21 DIAGNOSIS — I5032 Chronic diastolic (congestive) heart failure: Secondary | ICD-10-CM | POA: Diagnosis not present

## 2017-01-21 DIAGNOSIS — E538 Deficiency of other specified B group vitamins: Secondary | ICD-10-CM | POA: Diagnosis not present

## 2017-01-21 DIAGNOSIS — Z8543 Personal history of malignant neoplasm of ovary: Secondary | ICD-10-CM | POA: Diagnosis not present

## 2017-01-21 DIAGNOSIS — N183 Chronic kidney disease, stage 3 (moderate): Secondary | ICD-10-CM | POA: Diagnosis not present

## 2017-01-21 DIAGNOSIS — Z79899 Other long term (current) drug therapy: Secondary | ICD-10-CM | POA: Diagnosis not present

## 2017-01-21 DIAGNOSIS — R0602 Shortness of breath: Secondary | ICD-10-CM | POA: Diagnosis not present

## 2017-01-21 DIAGNOSIS — M170 Bilateral primary osteoarthritis of knee: Secondary | ICD-10-CM | POA: Diagnosis not present

## 2017-01-21 LAB — CBC WITH DIFFERENTIAL/PLATELET
BASOS PCT: 1 %
Basophils Absolute: 0 10*3/uL (ref 0–0.1)
EOS PCT: 1 %
Eosinophils Absolute: 0.1 10*3/uL (ref 0–0.7)
HEMATOCRIT: 29.8 % — AB (ref 35.0–47.0)
Hemoglobin: 9.6 g/dL — ABNORMAL LOW (ref 12.0–16.0)
Lymphocytes Relative: 21 %
Lymphs Abs: 1.3 10*3/uL (ref 1.0–3.6)
MCH: 30.4 pg (ref 26.0–34.0)
MCHC: 32.2 g/dL (ref 32.0–36.0)
MCV: 94.5 fL (ref 80.0–100.0)
MONO ABS: 0.6 10*3/uL (ref 0.2–0.9)
MONOS PCT: 9 %
NEUTROS ABS: 4.3 10*3/uL (ref 1.4–6.5)
Neutrophils Relative %: 68 %
PLATELETS: 208 10*3/uL (ref 150–440)
RBC: 3.16 MIL/uL — ABNORMAL LOW (ref 3.80–5.20)
RDW: 18 % — AB (ref 11.5–14.5)
WBC: 6.3 10*3/uL (ref 3.6–11.0)

## 2017-01-21 LAB — IRON AND TIBC
IRON: 28 ug/dL (ref 28–170)
SATURATION RATIOS: 9 % — AB (ref 10.4–31.8)
TIBC: 317 ug/dL (ref 250–450)
UIBC: 289 ug/dL

## 2017-01-21 LAB — FERRITIN: Ferritin: 22 ng/mL (ref 11–307)

## 2017-01-21 NOTE — Progress Notes (Signed)
Patient denies any concerns today.  

## 2017-01-23 ENCOUNTER — Telehealth: Payer: Self-pay | Admitting: Oncology

## 2017-01-23 NOTE — Telephone Encounter (Signed)
Appts schd and conf with patient for [facility] on 01/30/17 and 02/06/17, per schd msg/Dr Woodfin Ganja.

## 2017-01-28 ENCOUNTER — Telehealth: Payer: Self-pay | Admitting: Nurse Practitioner

## 2017-01-28 ENCOUNTER — Ambulatory Visit (INDEPENDENT_AMBULATORY_CARE_PROVIDER_SITE_OTHER): Payer: Medicare Other | Admitting: Nurse Practitioner

## 2017-01-28 ENCOUNTER — Other Ambulatory Visit: Payer: Self-pay

## 2017-01-28 ENCOUNTER — Encounter: Payer: Self-pay | Admitting: Nurse Practitioner

## 2017-01-28 VITALS — BP 140/42 | HR 41 | Ht 62.0 in | Wt 212.8 lb

## 2017-01-28 DIAGNOSIS — E782 Mixed hyperlipidemia: Secondary | ICD-10-CM | POA: Diagnosis not present

## 2017-01-28 DIAGNOSIS — R001 Bradycardia, unspecified: Secondary | ICD-10-CM

## 2017-01-28 DIAGNOSIS — I2510 Atherosclerotic heart disease of native coronary artery without angina pectoris: Secondary | ICD-10-CM | POA: Diagnosis not present

## 2017-01-28 DIAGNOSIS — I5032 Chronic diastolic (congestive) heart failure: Secondary | ICD-10-CM | POA: Diagnosis not present

## 2017-01-28 DIAGNOSIS — R0609 Other forms of dyspnea: Secondary | ICD-10-CM

## 2017-01-28 DIAGNOSIS — I10 Essential (primary) hypertension: Secondary | ICD-10-CM

## 2017-01-28 DIAGNOSIS — I6523 Occlusion and stenosis of bilateral carotid arteries: Secondary | ICD-10-CM

## 2017-01-28 MED ORDER — CLONIDINE 0.1 MG/24HR TD PTWK: 0.1000 mg | MEDICATED_PATCH | TRANSDERMAL | 3 refills | Status: DC

## 2017-01-28 MED ORDER — CLONIDINE 0.1 MG/24HR TD PTWK: 0.1000 mg | MEDICATED_PATCH | TRANSDERMAL | 12 refills | Status: DC

## 2017-01-28 NOTE — Progress Notes (Addendum)
Office Visit    Patient Name: Amy [name] Date of Encounter: 01/28/2017  Primary Care Provider:  Leonel Ramsay, MD Primary Cardiologist:  Ida Rogue, MD  Chief Complaint    83 year old female with a history of coronary artery disease status post prior LAD stenting in 2011, carotid arterial disease, HFpEF, hypertension, hyperlipidemia, diabetes, obesity, hypothyroidism, iron deficiency anemia, and chronic dyspnea who presents for follow-up.  Past Medical History    Past Medical History:  Diagnosis Date  . Bladder incontinence   . CAD (coronary artery disease)    a. 05/2009 Cath: LAD 90p (Xience 2.75 x 12 mm DES), 23m, D1 40, LCx 40p/m, RCA 30/40/30p/m, RCA 30/25d;  b.  12/2011 Lexiscan MV: no ischemia, breast attenuation artifact, normal EF-->Low risk; c. 10/2016 MV: fixed apical defect, most likely apical thinning and attenuation, No ischemia, EF 60%.  . Cancer ([place])    ovarian  . Carotid arterial disease w/ R Carotid Bruit (HCC)    a. 08/2016 Carotid U/S: 40-59% bilat ICA stenosis - f/u 1 yr.  . Chronic diastolic (congestive) heart failure ([place])    a. Echo 09/2015: EF 55-60% w/ Grade 1 DD, sev Ca2+ MV annulus, mildly dil LA; b. 09/2016 Echo: EF 65-70%, Gr2 DD, mildly dil LA/RA, nl RV fxn.  . Chronic Dyspnea on exertion   . CKD (chronic kidney disease) stage 3, GFR 30-59 ml/min (HCC)   . Degenerative arthritis of knee    bilateral knees  . Diabetes mellitus    Type II  . GIB (gastrointestinal bleeding)    a. 08/2016 Admission w/ presyncope/anemia/melena-->req Transfusion-->endo ok, colonoscopy w/ polyps but no source of bleeding (most likely diverticular).  . Hiatal hernia   . Hypertension   . Iron deficiency   . Menopausal symptoms   . Morbid obesity ([place])   . Renal insufficiency   . Thyroid disease    hypothyroidism   Past Surgical History:  Procedure Laterality Date  . COLONOSCOPY  2015  . COLONOSCOPY WITH PROPOFOL N/A 09/25/2016   Procedure:  COLONOSCOPY WITH PROPOFOL;  Surgeon: [name], MD;  Location: [facility] ENDOSCOPY;  Service: Gastroenterology;  Laterality: N/A;  . COLONOSCOPY WITH PROPOFOL N/A 09/26/2016   Procedure: COLONOSCOPY WITH PROPOFOL;  Surgeon: [name], MD;  Location: [facility] ENDOSCOPY;  Service: Gastroenterology;  Laterality: N/A;  . ESOPHAGOGASTRODUODENOSCOPY (EGD) WITH PROPOFOL N/A 09/25/2016   Procedure: ESOPHAGOGASTRODUODENOSCOPY (EGD) WITH PROPOFOL;  Surgeon: [name], MD;  Location: [facility] ENDOSCOPY;  Service: Gastroenterology;  Laterality: N/A;  . REPLACEMENT TOTAL KNEE BILATERAL    . TOTAL VAGINAL HYSTERECTOMY     ovarian mass, not cancerous  . UPPER GI ENDOSCOPY  2015    Allergies  Allergies  Allergen Reactions  . Morphine And Related Shortness Of Breath    Rash, difficulty breathing, nausea.   . Atenolol Other (See Comments)    Other reaction(s): Other (See Comments) Decreased heart rate Decreased heart rate  . Codeine     Rash, difficulty breathing, nausea.  . Nsaids Other (See Comments)    Other reaction(s): Unknown  . Rofecoxib     Other reaction(s): Unknown  . Sucralfate Other (See Comments)    Throat tightness  . Sulfa Antibiotics     Other reaction(s): Unknown    History of Present Illness    83 y/o ? with the above complex past medical history including coronary artery disease status post LAD stenting in 2011, carotid arterial disease, HFpEF, hypertension, hyperlipidemia, diabetes, obesity, hypothyroidism, iron deficiency anemia, and chronic dyspnea.  In the setting of chronic dyspnea, she has undergone stress testing in 2013, which was nonischemic, echocardiogram in August 2017, which showed normal LV function with grade 1 diastolic dysfunction, and most recently repeat echocardiogram in August 2018 showing normal LV function and grade 2 diastolic dysfunction.  She was advised to take Lasix 20 mg daily following echocardiogram however, due to frequency of urination, she has gone back to  just taking it as needed and only rarely uses it.  Stress testing was also undertaken in September 2018 and was nonischemic.  Since then, she has followed up with pulmonology.  It is felt that her persistent dyspnea is multifactorial.  She has been participating in cardiopulmonary rehabilitation twice a week.  For the most part, she is chair/wheelchair bound and is able to do exercises while seated.  She thinks that arm and leg strength have improved some though she still has pretty profound dyspnea on exertion after walking just a few steps.  Over the past few months, she noted 3 fleeting episodes of chest discomfort that occurred in the center of her chest lasting just a few seconds and resolving spontaneously.  She has never had these symptoms when at cardiopulmonary rehab.  She denies palpitations, PND, orthopnea, dizziness, syncope, or early satiety.  She occasionally notes mild ankle edema.  Of note on today's visit, she is bradycardic with a heart rate of 41.  She is not currently on any beta-blockers.  She is asymptomatic.  Home Medications    Prior to Admission medications   Medication Sig Start Date End Date Taking? Authorizing Provider  amLODipine (NORVASC) 10 MG tablet Take by mouth.   Yes [provider]  cloNIDine (CATAPRES - DOSED IN MG/24 HR) 0.3 mg/24hr patch Place 0.3 mg onto the skin once a week.  12/11/14  Yes [provider]  clopidogrel (PLAVIX) 75 MG tablet Take 1 tablet (75 mg total) by mouth daily. 09/27/16  Yes Dustin Flock, MD  doxazosin (CARDURA) 8 MG tablet TAKE ONE TABLET TWICE DAILY 12/29/16  Yes Gollan, Kathlene November, MD  furosemide (LASIX) 20 MG tablet Take 1 tablet (20 mg total) by mouth daily. 10/22/16  Yes Rogelia Mire, NP  glimepiride (AMARYL) 1 MG tablet  11/19/16  Yes [provider]  ipratropium-albuterol (DUONEB) 0.5-2.5 (3) MG/3ML SOLN Take 3 mLs by nebulization every 4 (four) hours as needed (shortness of breath/wheezing.). 10/11/15   Yes Sainani, Belia Heman, MD  levothyroxine (SYNTHROID, LEVOTHROID) 125 MCG tablet Take 125 mcg by mouth daily.   Yes [provider]  losartan (COZAAR) 50 MG tablet Take 1 tablet (50 mg total) by mouth daily. 11/07/16  Yes Rogelia Mire, NP  metFORMIN (GLUCOPHAGE) 1000 MG tablet Take 1,000 mg by mouth 2 (two) times daily with a meal.   Yes [provider]  Multiple Vitamin (MULTIVITAMIN) tablet Take 1 tablet by mouth daily.   Yes [provider]  oxybutynin (DITROPAN) 5 MG tablet Take 5 mg by mouth as needed.    Yes [provider]  senna (SENOKOT) 8.6 MG TABS tablet Take 1 tablet (8.6 mg total) by mouth daily as needed for mild constipation. 10/11/15  Yes Henreitta Leber, MD  simvastatin (ZOCOR) 40 MG tablet Take 1 tablet (40 mg total) by mouth at bedtime. 08/22/16  Yes Minna Merritts, MD  SYMBICORT 160-4.5 MCG/ACT inhaler Inhale 2 puffs into the lungs 2 (two) times daily.  11/15/14  Yes [provider]    Review of Systems  Chronic dyspnea on exertion as outlined below.  3 fleeting episodes of chest pain over the past few months.  Her legs also get weak when she is up and walking.  As result, she walks very little.  She denies palpitations, PND, orthopnea, dizziness, syncope, or early satiety.  With occasional, mild ankle edema noted.  All other systems reviewed and are otherwise negative except as noted above.  Physical Exam    VS:  BP (!) 140/42 (BP Location: Right Arm, Patient Position: Sitting, Cuff Size: Large)   Pulse (!) 41   Ht 5\' 2"  (1.575 m)   Wt 212 lb 12 oz (96.5 kg)   BMI 38.91 kg/m  , BMI Body mass index is 38.91 kg/m. GEN: Obese, in no acute distress.  HEENT: normal.  Neck: Supple, obese, difficult to gauge JVP.  No carotid bruits, or masses. Cardiac: RRR, bradycardic, 2/6 systolic ejection murmur at the upper sternal borders-heard throughout, no rubs, or gallops. No clubbing, cyanosis, trace ankle edema.   Radials/DP/PT  2+ and equal bilaterally.  Respiratory:  Respirations regular and unlabored, clear to auscultation bilaterally. GI: Soft, nontender, nondistended, BS + x 4. MS: no deformity or atrophy. Skin: warm and dry, no rash. Neuro:  Strength and sensation are intact. Psych: Normal affect.  Accessory Clinical Findings    ECG -sinus bradycardia, 41 first-degree AV block, right bundle branch block.  T waves slightly taller than on previous ecg's.  Assessment & Plan    1.  Coronary artery disease: Status post stenting of the LAD in 2011.  Recent nonischemic Myoview.  She is chronic dyspnea on exertion.  She has had 3 brief and fleeting episodes of chest pain which occurred at rest.  She is partaking in cardiopulmonary rehab and has not had any symptoms of chest pain there.  Given recent ischemic evaluation, would not pursue additional testing for these atypical symptoms.  She remains on Plavix and statin therapy.  Aspirin was discontinued earlier this year in the setting of GI bleed.  2.  Chronic dyspnea on exertion: Likely multifactorial.  She does have grade 2 diastolic dysfunction on echo but is not significantly volume overload on exam.  I had previously recommended Lasix 20 mg daily however her immobility and intermittent incontinence make this difficult for her.  In the absence of any significant volume overload today, I think the burden of daily Lasix probably outweighs the benefit.  She will continue to take as needed.  3.  Sinus bradycardia: This is new for her.  Heart rate is 41.  She is asymptomatic.  She has a chronic right bundle branch block.  PR interval is longer than on previous ECGs.  She is not on a beta-blocker as beta-blockers have previously been documented to cause bradycardia in her.  She is however on clonidine and I will reduce this to 0.1 mg patch weekly.  I did ambulate her and her heart rate did come up to 70 with taking just a few steps.  F/u BMET today given prior h/o mild  hyperkalemia.  I will plan on a follow-up nurse visit to reassess heart rate and blood pressure next week.  4.  Essential hypertension: Blood pressure slightly above goal.  We will need to follow with blood pressure check next week given adjustment to clonidine.  May need to consider addition of hydralazine.  5.  Hyperlipidemia: LDL 74.  Continue statin therapy.  6.  Iron deficiency anemia: No complaints of melena.  Followed by hematology.  7.  Disposition: Follow-up with nurse blood pressure and heart rate check in 1 week.  Follow-up in clinic in 3 months or sooner if necessary.   Murray Hodgkins, NP 01/28/2017, 10:46 AM

## 2017-01-28 NOTE — Patient Instructions (Addendum)
Medication Instructions:  Your physician has recommended you make the following change in your medication:  DECREASE clonidine patch to 0.1mg . Change weekly   Labwork: none  Testing/Procedures: none  Follow-Up: Your physician recommends that you schedule a follow-up appointment in: 3 months with Dr. Rockey Situ Your physician recommends that you schedule a nurse visit for BP and HR check in one week.    Any Other Special Instructions Will Be Listed Below (If Applicable).     If you need a refill on your cardiac medications before your next appointment, please call your pharmacy.  Clonidine skin patches What is this medicine? CLONIDINE (KLOE ni deen) is used to treat high blood pressure. This medicine may be used for other purposes; ask your health care provider or pharmacist if you have questions. COMMON BRAND NAME(S): Catapres-TTS What should I tell my health care provider before I take this medicine? They need to know if you have any of these conditions: -kidney disease -an unusual or allergic reaction to clonidine, other medicines, foods, dyes, or preservatives -pregnant or trying to get pregnant -breast-feeding How should I use this medicine? This medicine is for external use only. Follow the directions on the prescription label. Apply the patch to an area of the upper arm or part of the body that is clean, dry and hairless. Avoid injured, irritated, calloused, or scarred areas. Use a different site each time to prevent skin irritation. Do not cut or trim the patch. One patch should last for 7 days. Do not use your medicine more often than directed. Do not stop using except on the advice of your doctor or health care professional. You must gradually reduce the dose or you may get a dangerous increase in blood pressure. Talk to your pediatrician regarding the use of this medicine in children. Special care may be needed. Overdosage: If you think you have taken too much of this  medicine contact a poison control center or emergency room at once. NOTE: This medicine is only for you. Do not share this medicine with others. What if I miss a dose? Replace each patch on the same day of each week, or if the patch falls off. If you do forget to change the patch for two or three days, check with your doctor or health care professional. What may interact with this medicine? Do not take this medicine with any of the following medications: -MAOIs like Carbex, Eldepryl, Marplan, Nardil, and Parnate This medicine may also interact with the following medications: -barbiturate medicines for inducing sleep or treating seizures like phenobarbital -certain medicines for blood pressure, heart disease, irregular heart beat -certain medicines for depression, anxiety, or psychotic disturbances -prescription pain medicines This list may not describe all possible interactions. Give your health care provider a list of all the medicines, herbs, non-prescription drugs, or dietary supplements you use. Also tell them if you smoke, drink alcohol, or use illegal drugs. Some items may interact with your medicine. What should I watch for while using this medicine? Visit your doctor or health care professional for regular checks on your progress. Check your heart rate and blood pressure regularly while you are using this medicine. Ask your doctor or health care professional what your heart rate should be and when you should contact him or her. You can shower or bathe with the skin patch in position. If the patch gets loose, cover it with the extra adhesive overlay provided. You may get drowsy or dizzy. Do not drive, use machinery, or do  anything that needs mental alertness until you know how this medicine affects you. To avoid dizzy or fainting spells, do not stand or sit up quickly, especially if you are an older person. Alcohol can make you more drowsy and dizzy. Avoid alcoholic drinks. Your mouth may get  dry. Chewing sugarless gum or sucking hard candy, and drinking plenty of water will help. Do not treat yourself for coughs, colds, or pain while you are using this medicine without asking your doctor or health care professional for advice. Some ingredients may increase your blood pressure. If you are going to have surgery tell your doctor or health care professional that you are using this medicine. If you are going to have a magnetic resonance imaging (MRI) procedure, tell your MRI technician if you have this patch on your body. It must be removed before a MRI. What side effects may I notice from receiving this medicine? Side effects that you should report to your doctor or health care professional as soon as possible: -allergic reactions like skin rash, itching or hives, swelling of the face, lips, or tongue -anxiety, nervousness -chest pain -depression -fast, irregular heartbeat -swelling of feet or legs -unusually weak or tired Side effects that usually do not require medical attention (report to your doctor or health care professional if they continue or are bothersome): -change in sex drive or performance -constipation -headache -skin redness, irritation, or darkening under the patch area This list may not describe all possible side effects. Call your doctor for medical advice about side effects. You may report side effects to FDA at 1-800-FDA-1088. Where should I keep my medicine? Keep out of the reach of children. Store at room temperature between 15 and 30 degrees C (59 to 86 degrees F). Throw away any unused medicine after the expiration date. NOTE: This sheet is a summary. It may not cover all possible information. If you have questions about this medicine, talk to your doctor, pharmacist, or health care provider.  2018 Elsevier/Gold Standard (2014-10-23 76:28:31)

## 2017-01-28 NOTE — Telephone Encounter (Signed)
Pt had appt today with Ignacia Bayley, NP. HR in the 40s. He was concerned for hyperkalemia and ordered BMET after pt left the office. I called her at home. Pt reports labs last week at [facility].  Reviewed lab work in [employer]. 11/29 potassium 5.7. She was instructed to decrease losartan to 50mg  qd w/repeat labs tomorrow.  Will await [facility] lab results.  Ignacia Bayley aware.

## 2017-01-29 DIAGNOSIS — E875 Hyperkalemia: Secondary | ICD-10-CM | POA: Diagnosis not present

## 2017-01-29 DIAGNOSIS — E162 Hypoglycemia, unspecified: Secondary | ICD-10-CM | POA: Diagnosis not present

## 2017-01-29 DIAGNOSIS — I10 Essential (primary) hypertension: Secondary | ICD-10-CM | POA: Diagnosis not present

## 2017-01-29 DIAGNOSIS — E1165 Type 2 diabetes mellitus with hyperglycemia: Secondary | ICD-10-CM | POA: Diagnosis not present

## 2017-01-29 DIAGNOSIS — R195 Other fecal abnormalities: Secondary | ICD-10-CM | POA: Diagnosis not present

## 2017-01-30 ENCOUNTER — Inpatient Hospital Stay: Payer: Medicare Other | Attending: Oncology

## 2017-01-30 VITALS — BP 154/72 | HR 43 | Temp 96.3°F | Resp 22

## 2017-01-30 DIAGNOSIS — E875 Hyperkalemia: Secondary | ICD-10-CM | POA: Diagnosis not present

## 2017-01-30 DIAGNOSIS — Z79899 Other long term (current) drug therapy: Secondary | ICD-10-CM | POA: Diagnosis not present

## 2017-01-30 DIAGNOSIS — D509 Iron deficiency anemia, unspecified: Secondary | ICD-10-CM | POA: Insufficient documentation

## 2017-01-30 DIAGNOSIS — D500 Iron deficiency anemia secondary to blood loss (chronic): Secondary | ICD-10-CM

## 2017-01-30 MED ORDER — SODIUM CHLORIDE 0.9 % IV SOLN
Freq: Once | INTRAVENOUS | Status: AC
  Administered 2017-01-30: 12:00:00 via INTRAVENOUS
  Filled 2017-01-30: qty 1000

## 2017-01-30 MED ORDER — SODIUM CHLORIDE 0.9 % IV SOLN
510.0000 mg | Freq: Once | INTRAVENOUS | Status: AC
  Administered 2017-01-30: 510 mg via INTRAVENOUS
  Filled 2017-01-30: qty 17

## 2017-01-30 NOTE — Telephone Encounter (Signed)
Thanks for update.  I agree with that plan.  HR stable on appt yesterday.  Keep appt for bp check and ecg.  With hyperK being main issue, we may be able to increase clonidine back to where it was if bp running up.

## 2017-01-30 NOTE — Telephone Encounter (Signed)
Per [facility] notes, 12/6 potassium  6.2 up from 5.7 on 11/29 Based on results, Dr. Ola Spurr stopped losartan, prescribed kayexalate x 1, repeat BMET today at [facility]. Routed to Ignacia Bayley, NP, to make aware.

## 2017-02-02 ENCOUNTER — Ambulatory Visit: Payer: Medicare Other | Admitting: Podiatry

## 2017-02-03 ENCOUNTER — Ambulatory Visit: Payer: Medicare Other | Admitting: Oncology

## 2017-02-03 ENCOUNTER — Ambulatory Visit: Payer: Medicare Other

## 2017-02-03 ENCOUNTER — Other Ambulatory Visit: Payer: Medicare Other

## 2017-02-04 ENCOUNTER — Ambulatory Visit: Payer: Medicare Other

## 2017-02-05 ENCOUNTER — Encounter: Payer: Self-pay | Admitting: Podiatry

## 2017-02-05 ENCOUNTER — Ambulatory Visit (INDEPENDENT_AMBULATORY_CARE_PROVIDER_SITE_OTHER): Payer: Medicare Other | Admitting: Podiatry

## 2017-02-05 DIAGNOSIS — D689 Coagulation defect, unspecified: Secondary | ICD-10-CM

## 2017-02-05 DIAGNOSIS — B351 Tinea unguium: Secondary | ICD-10-CM

## 2017-02-05 DIAGNOSIS — M79676 Pain in unspecified toe(s): Secondary | ICD-10-CM | POA: Diagnosis not present

## 2017-02-05 DIAGNOSIS — E119 Type 2 diabetes mellitus without complications: Secondary | ICD-10-CM

## 2017-02-05 NOTE — Progress Notes (Addendum)
Complaint:  Visit Type: Patient returns to my office for continued preventative foot care services. Complaint: Patient states" my nails have grown long and thick and become painful to walk and wear shoes" Patient has been diagnosed with DM with no foot complications. The patient presents for preventative foot care services. No changes to ROS.  Patient is taking plavix.  Podiatric Exam: Vascular: dorsalis pedis and posterior tibial pulses are palpable bilateral. Capillary return is immediate. Temperature gradient is WNL. Skin turgor WNL  Sensorium: Normal Semmes Weinstein monofilament test. Normal tactile sensation bilaterally. Nail Exam: Pt has thick disfigured discolored nails with subungual debris noted bilateral entire nail hallux through fifth toenails Ulcer Exam: There is no evidence of ulcer or pre-ulcerative changes or infection. Orthopedic Exam: Muscle tone and strength are WNL. No limitations in general ROM. No crepitus or effusions noted. Foot type and digits show no abnormalities. Bony prominences are unremarkable. Skin: No Porokeratosis. No infection or ulcers  Diagnosis:  Onychomycosis, , Pain in right toe, pain in left toes  Treatment & Plan Procedures and Treatment: Consent by patient was obtained for treatment procedures. The patient understood the discussion of treatment and procedures well. All questions were answered thoroughly reviewed. Debridement of mycotic and hypertrophic toenails, 1 through 5 bilateral and clearing of subungual debris. No ulceration, no infection noted. ABN signed for 2018. Return Visit-Office Procedure: Patient instructed to return to the office for a follow up visit 10 weeks  for continued evaluation and treatment.    Gardiner Barefoot DPM

## 2017-02-06 ENCOUNTER — Telehealth: Payer: Self-pay | Admitting: Cardiovascular Disease

## 2017-02-06 ENCOUNTER — Ambulatory Visit: Payer: Medicare Other

## 2017-02-06 ENCOUNTER — Ambulatory Visit (INDEPENDENT_AMBULATORY_CARE_PROVIDER_SITE_OTHER): Payer: Medicare Other | Admitting: *Deleted

## 2017-02-06 VITALS — BP 198/35 | HR 61 | Ht 62.0 in | Wt 208.0 lb

## 2017-02-06 DIAGNOSIS — I10 Essential (primary) hypertension: Secondary | ICD-10-CM

## 2017-02-06 MED ORDER — LOSARTAN POTASSIUM 50 MG PO TABS: 100.0000 mg | ORAL_TABLET | Freq: Every day | ORAL | Status: DC

## 2017-02-06 MED ORDER — CLONIDINE 0.1 MG/24HR TD PTWK: MEDICATED_PATCH | TRANSDERMAL | Status: DC

## 2017-02-06 NOTE — Telephone Encounter (Signed)
I spoke with the patient. She was in the office earlier today for a BP check/ HR follow up per checkout with Ignacia Bayley, NP on 01/29/15.  "3.  Sinus bradycardia: This is new for her.  Heart rate is 41.  She is asymptomatic.  She has a chronic right bundle branch block.  PR interval is longer than on previous ECGs.  She is not on a beta-blocker as beta-blockers have previously been documented to cause bradycardia in her.  She is however on clonidine and I will reduce this to 0.1 mg patch weekly.  I did ambulate her and her heart rate did come up to 70 with taking just a few steps.  F/u BMET today given prior h/o mild hyperkalemia.  I will plan on a follow-up nurse visit to reassess heart rate and blood pressure next week."  Repeat BP/ HR today- 198/35/  61  Reviewed with Dr. Fletcher Anon and instructions were to increase losartan to 100 mg once daily, repeat BMP in 1 week, follow up with the PA/ NP in 2 weeks.   The patient called back this afternoon to confirm that Dr. Ola Spurr had stopped her losartan about a week ago due to hyperkalemia. In reviewing her chart, this was stopped on 01/29/17 due to a potassium of 6.2 on losartan 50 mg once daily. She was also given kayexalate by Dr. Ola Spurr at that time.  I advised that patient to make no changes to her medications at this time. I will need to review with Dr. Arley Phenix, NP and call her back with further recommendations. All other BP medications on file are correct per the patient.  Losartan being removed from her medication list.

## 2017-02-06 NOTE — Patient Instructions (Signed)
Medication Instructions: - Your physician has recommended you make the following change in your medication:  1) Increase cozaar (losartan) 50 mg - take 2 tablets (100 mg) by mouth once daily  Labwork: - Your physician recommends that you return for lab work in: 1 week- BMP  Procedures/Testing: - none ordered  Follow-Up: - Your physician recommends that you schedule a follow-up appointment in: 2 weeks with the PA/ NP- blood pressure follow up.    Any Additional Special Instructions Will Be Listed Below (If Applicable).     If you need a refill on your cardiac medications before your next appointment, please call your pharmacy.

## 2017-02-06 NOTE — Telephone Encounter (Signed)
Pt is calling regarding her medications. States her PCP took her off of Losartan. Please call.

## 2017-02-06 NOTE — Telephone Encounter (Signed)
I suspect bradycardia noted on last clinic visit was 2/2 hyperkalemia, not clonidine. She should remain off of losartan.  She can increase clonidine patch to 0.2mg  daily - ok to put on a second patch until she can get new Rx filled.

## 2017-02-06 NOTE — Progress Notes (Signed)
1.) Reason for visit: BP/HR check  2.) Name of MD requesting visit: Ignacia Bayley, NP*  3.) H&P: HTN, diabetes, CAD  4.) ROS related to problem: Here for BP and Heart rate check. Today BP on left arm 198/35, right arm 220/25, HR 61. Patient reports dull headache off and on over the past few days. No other symptoms. At office visit on 01/28/17, Ignacia Bayley, NP decreased clonidine patch to 0.1 mg qweekly.   5.) Assessment and plan per MD: Dr Fletcher Anon reviewed BP's and heart rate and chart. He advised to increase losartan to 100 mg daily, BMET in 1 week, and schedule follow up with APP in 2 weeks.

## 2017-02-06 NOTE — Telephone Encounter (Signed)
I spoke with the patient.  She is aware of Amy [name]' recommendations to stay off losartan and increase clonidine to 0.2 mg/ 24 hour patch.  She recently received a #90 day supply of the 0.1 mg strength patches.  I have advised her to wear 2 of those until we see her back with Amy [name], Amy [name] on 03/12/17. We will cancel her lab appointment next week as Dr. Ola Spurr will be rechecking this for her on 02/11/17.

## 2017-02-09 DIAGNOSIS — H353222 Exudative age-related macular degeneration, left eye, with inactive choroidal neovascularization: Secondary | ICD-10-CM | POA: Diagnosis not present

## 2017-02-09 DIAGNOSIS — H353211 Exudative age-related macular degeneration, right eye, with active choroidal neovascularization: Secondary | ICD-10-CM | POA: Diagnosis not present

## 2017-02-10 ENCOUNTER — Inpatient Hospital Stay: Payer: Medicare Other

## 2017-02-10 VITALS — BP 172/70 | HR 45 | Temp 97.1°F | Resp 22

## 2017-02-10 DIAGNOSIS — Z79899 Other long term (current) drug therapy: Secondary | ICD-10-CM | POA: Diagnosis not present

## 2017-02-10 DIAGNOSIS — D509 Iron deficiency anemia, unspecified: Secondary | ICD-10-CM

## 2017-02-10 DIAGNOSIS — D500 Iron deficiency anemia secondary to blood loss (chronic): Secondary | ICD-10-CM

## 2017-02-10 MED ORDER — FERUMOXYTOL INJECTION 510 MG/17 ML
510.0000 mg | Freq: Once | INTRAVENOUS | Status: AC
  Administered 2017-02-10: 510 mg via INTRAVENOUS
  Filled 2017-02-10: qty 17

## 2017-02-10 MED ORDER — SODIUM CHLORIDE 0.9 % IV SOLN
Freq: Once | INTRAVENOUS | Status: AC
  Administered 2017-02-10: 12:00:00 via INTRAVENOUS
  Filled 2017-02-10: qty 1000

## 2017-02-11 ENCOUNTER — Other Ambulatory Visit: Payer: Self-pay | Admitting: Infectious Diseases

## 2017-02-11 DIAGNOSIS — N183 Chronic kidney disease, stage 3 unspecified: Secondary | ICD-10-CM

## 2017-02-11 DIAGNOSIS — E079 Disorder of thyroid, unspecified: Secondary | ICD-10-CM | POA: Diagnosis not present

## 2017-02-11 DIAGNOSIS — I10 Essential (primary) hypertension: Secondary | ICD-10-CM | POA: Diagnosis not present

## 2017-02-11 DIAGNOSIS — E875 Hyperkalemia: Secondary | ICD-10-CM | POA: Diagnosis not present

## 2017-02-11 DIAGNOSIS — E1165 Type 2 diabetes mellitus with hyperglycemia: Secondary | ICD-10-CM | POA: Diagnosis not present

## 2017-02-12 ENCOUNTER — Other Ambulatory Visit: Payer: Medicare Other

## 2017-02-12 ENCOUNTER — Ambulatory Visit: Payer: Medicare Other | Admitting: Podiatry

## 2017-02-23 ENCOUNTER — Ambulatory Visit
Admission: RE | Admit: 2017-02-23 | Discharge: 2017-02-23 | Disposition: A | Discharge status: Home / Self Care | Payer: Medicare Other | Source: Ambulatory Visit | Attending: Infectious Diseases | Admitting: Infectious Diseases

## 2017-02-23 DIAGNOSIS — N183 Chronic kidney disease, stage 3 unspecified: Secondary | ICD-10-CM

## 2017-02-23 DIAGNOSIS — N281 Cyst of kidney, acquired: Secondary | ICD-10-CM | POA: Insufficient documentation

## 2017-02-23 DIAGNOSIS — I10 Essential (primary) hypertension: Secondary | ICD-10-CM | POA: Insufficient documentation

## 2017-02-23 DIAGNOSIS — E1165 Type 2 diabetes mellitus with hyperglycemia: Secondary | ICD-10-CM | POA: Diagnosis not present

## 2017-02-23 DIAGNOSIS — E1122 Type 2 diabetes mellitus with diabetic chronic kidney disease: Secondary | ICD-10-CM | POA: Diagnosis not present

## 2017-03-04 DIAGNOSIS — E538 Deficiency of other specified B group vitamins: Secondary | ICD-10-CM | POA: Diagnosis not present

## 2017-03-11 NOTE — Progress Notes (Signed)
Cardiology Office Note Date:  03/12/2017  Patient ID:  Amy [name], DOB June 14, 1927, MRN [id number] PCP:  Leonel Ramsay, MD  Cardiologist:  Dr. Rockey Situ, MD    Chief Complaint: Follow up  History of Present Illness: Amy [name] is a 84 y.o. female with history of CAD s/p prior LAD stenting in 2011, carotid artery disease, HFpEF, GI bleed, HTN, HLD, DM, obesity, hypothyroidism, iron deficiency anemia, and chronic dyspnea who presents for follow up HTN and bradycardia.  In the setting of chronic dyspnea, she has previously undergone stress testing in 2013, which was nonischemic, echocardiogram in 09/2015, which showed normal LV function with Gr1DD, and most recently repeat echocardiogram in 09/2016 showing normal LV function and Gr2DD. She was advised to take Lasix 20 mg daily following echocardiogram however, due to frequency of urination, she self decreased her dose back to just taking it as needed and only rarely used it. Stress testing was also undertaken in 10/2016 and was nonischemic. Since then, she has followed up with pulmonology and it has been felt her persistent dyspnea is multifactorial. She has been participating in cardiopulmonary rehabilitation twice a week. She was most recently seen by Ignacia Bayley on 02/07/17 for follow up and it was noted at that time she was chair/wheelchair bound and was able to do exercises while seated. She felt that her arm and leg strength had improved some though she had been noted to have profound dyspnea on exertion after walking just a few steps. She also noted over the past few months 3 fleeting episodes of chest discomfort that occurred in the center of her chest lasting just a few seconds and resolving spontaneously. She denied having had these symptoms when at cardiopulmonary rehab. She was noted have a heart rate of 41 bpm at that visit, not on any beta-blockers (previously documented to cause bradycardia in her), and was asymptomatic. She  was noted to be on clonidine though and this was reduced 0.1 mg patch weekly. She was ambulated in the office with a heart rate that came up to 70 bpm with her taking just a few steps. Bmet was ordered, and ran through the [facility] system on 12/18 that showed an improved potassium of 4.7 (prior 5.5 on 02/09/17 and 6.2 on 02/08/17). RN visit on 02/06/17 showed an BP of 198/35 left arm and 220/25 right arm with heart rate of 61 bpm. Her losartan was increased to 100 mg daily with follow up labs and appointment in 1-2 weeks respectively. She saw her PCP on 02/11/17 with a BP of 157/49 and heart rate of 57 bpm. There appears to have been some confusion regarding her antihypertensive medication as PCP notes the patient is off losartan due to the above noted hyperkalemia and CKD and is on clonidine 0.3 mg patch weekly. PCP ordered a renal ultrasound on 02/23/17 that showed a right renal midpole non-simple appearing cystic structure measuring 2 x 1.3 x 1.2 cm. Repeat renal ultrasound vs renal MRI was advised per radiology.   She comes in today noting stable, chronic dyspnea. She continues to work with pulmonary rehab. She has not had any further chest pain. Her current antihypertensive medications include amlodipine 10 mg daily, clonidine patch 0.2 mg weekly, Cardura 8 mg bid, Lasix 20 mg daily (though only taking this medication prn and has not taken it since the week prior). She is not checking her blood pressures at home, though reports BP checks at cardiac rehab typically in the 160s to 170s  systolic. Lower extremity swelling is stable. Weight is down 9 pounds today compared to her 12/5 visit.    Past Medical History:  Diagnosis Date  . Bladder incontinence   . CAD (coronary artery disease)    a. 05/2009 Cath: LAD 90p (Xience 2.75 x 12 mm DES), 75m, D1 40, LCx 40p/m, RCA 30/40/30p/m, RCA 30/25d;  b.  12/2011 Lexiscan MV: no ischemia, breast attenuation artifact, normal EF-->Low risk; c. 10/2016 MV: fixed apical  defect, most likely apical thinning and attenuation, No ischemia, EF 60%.  . Cancer ([place])    ovarian  . Carotid arterial disease w/ R Carotid Bruit (HCC)    a. 08/2016 Carotid U/S: 40-59% bilat ICA stenosis - f/u 1 yr.  . Chronic diastolic (congestive) heart failure ([place])    a. Echo 09/2015: EF 55-60% w/ Grade 1 DD, sev Ca2+ MV annulus, mildly dil LA; b. 09/2016 Echo: EF 65-70%, Gr2 DD, mildly dil LA/RA, nl RV fxn.  . Chronic Dyspnea on exertion   . CKD (chronic kidney disease) stage 3, GFR 30-59 ml/min (HCC)   . Degenerative arthritis of knee    bilateral knees  . Diabetes mellitus    Type II  . GIB (gastrointestinal bleeding)    a. 08/2016 Admission w/ presyncope/anemia/melena-->req Transfusion-->endo ok, colonoscopy w/ polyps but no source of bleeding (most likely diverticular).  . Hiatal hernia   . Hypertension   . Iron deficiency   . Menopausal symptoms   . Morbid obesity ([place])   . Renal insufficiency   . Thyroid disease    hypothyroidism    Past Surgical History:  Procedure Laterality Date  . COLONOSCOPY  2015  . COLONOSCOPY WITH PROPOFOL N/A 09/25/2016   Procedure: COLONOSCOPY WITH PROPOFOL;  Surgeon: [name], MD;  Location: [facility] ENDOSCOPY;  Service: Gastroenterology;  Laterality: N/A;  . COLONOSCOPY WITH PROPOFOL N/A 09/26/2016   Procedure: COLONOSCOPY WITH PROPOFOL;  Surgeon: [name], MD;  Location: [facility] ENDOSCOPY;  Service: Gastroenterology;  Laterality: N/A;  . ESOPHAGOGASTRODUODENOSCOPY (EGD) WITH PROPOFOL N/A 09/25/2016   Procedure: ESOPHAGOGASTRODUODENOSCOPY (EGD) WITH PROPOFOL;  Surgeon: [name], MD;  Location: [facility] ENDOSCOPY;  Service: Gastroenterology;  Laterality: N/A;  . REPLACEMENT TOTAL KNEE BILATERAL    . TOTAL VAGINAL HYSTERECTOMY     ovarian mass, not cancerous  . UPPER GI ENDOSCOPY  2015    Current Meds  Medication Sig  . amLODipine (NORVASC) 10 MG tablet Take by mouth.  . cloNIDine (CATAPRES - DOSED IN MG/24 HR) 0.2 mg/24hr patch Place 0.2 mg  onto the skin once a week.  . clopidogrel (PLAVIX) 75 MG tablet Take 1 tablet (75 mg total) by mouth daily.  Marland Kitchen docusate sodium (COLACE) 100 MG capsule Take 100 mg by mouth daily as needed for mild constipation.  Marland Kitchen doxazosin (CARDURA) 8 MG tablet TAKE ONE TABLET TWICE DAILY  . furosemide (LASIX) 20 MG tablet Take 1 tablet (20 mg total) by mouth daily.  Marland Kitchen glimepiride (AMARYL) 1 MG tablet   . ipratropium-albuterol (DUONEB) 0.5-2.5 (3) MG/3ML SOLN Take 3 mLs by nebulization every 4 (four) hours as needed (shortness of breath/wheezing.).  Marland Kitchen levothyroxine (SYNTHROID, LEVOTHROID) 125 MCG tablet Take 125 mcg by mouth daily.  . metFORMIN (GLUCOPHAGE) 1000 MG tablet Take 1,000 mg by mouth 2 (two) times daily with a meal.  . Multiple Vitamin (MULTIVITAMIN) tablet Take 1 tablet by mouth daily.  Marland Kitchen oxybutynin (DITROPAN) 5 MG tablet Take 5 mg by mouth as needed.   . senna (SENOKOT) 8.6 MG TABS tablet Take 1  tablet (8.6 mg total) by mouth daily as needed for mild constipation.  . simvastatin (ZOCOR) 40 MG tablet Take 1 tablet (40 mg total) by mouth at bedtime.  . SYMBICORT 160-4.5 MCG/ACT inhaler Inhale 2 puffs into the lungs 2 (two) times daily.     Allergies:   Losartan; Morphine and related; Atenolol; Codeine; Nsaids; Rofecoxib; Sucralfate; and Sulfa antibiotics   Social History:  The patient  reports that  has never smoked. she has never used smokeless tobacco. She reports that she does not drink alcohol or use drugs.   Family History:  The patient's family history includes Breast cancer (age of onset: 60) in her mother.  ROS:   Review of Systems  Constitutional: Positive for malaise/fatigue. Negative for chills, diaphoresis, fever and weight loss.  HENT: Negative for congestion.   Eyes: Negative for discharge and redness.  Respiratory: Positive for shortness of breath. Negative for cough, hemoptysis, sputum production and wheezing.   Cardiovascular: Positive for leg swelling. Negative for chest  pain, palpitations, orthopnea, claudication and PND.  Gastrointestinal: Negative for abdominal pain, blood in stool, heartburn, melena, nausea and vomiting.  Genitourinary: Negative for hematuria.  Musculoskeletal: Negative for falls and myalgias.  Skin: Negative for rash.  Neurological: Negative for dizziness, tingling, tremors, sensory change, speech change, focal weakness, loss of consciousness and weakness.  Endo/Heme/Allergies: Does not bruise/bleed easily.  Psychiatric/Behavioral: Negative for substance abuse. The patient is not nervous/anxious.   All other systems reviewed and are negative.    PHYSICAL EXAM:  VS:  BP (!) 160/50 (BP Location: Left Arm, Patient Position: Sitting, Cuff Size: Large)   Pulse (!) 56   Ht 5\' 2"  (1.575 m)   Wt 203 lb (92.1 kg)   BMI 37.13 kg/m  BMI: Body mass index is 37.13 kg/m.  Physical Exam  Constitutional: She is oriented to person, place, and time. She appears well-developed and well-nourished.  HENT:  Head: Normocephalic and atraumatic.  Eyes: Right eye exhibits no discharge. Left eye exhibits no discharge.  Neck: Normal range of motion. No JVD present.  Cardiovascular: Regular rhythm, S1 normal and S2 normal. Bradycardia present. Exam reveals no distant heart sounds, no friction rub, no midsystolic click and no opening snap.  Murmur heard.  Holosystolic murmur is present with a grade of 2/6 at the upper right sternal border and upper left sternal border. Pulmonary/Chest: Effort normal and breath sounds normal. No respiratory distress. She has no decreased breath sounds. She has no wheezes. She has no rales. She exhibits no tenderness.  Abdominal: Soft. She exhibits no distension. There is no tenderness.  Musculoskeletal: She exhibits no edema.  Neurological: She is alert and oriented to person, place, and time.  Skin: Skin is warm and dry. No cyanosis. Nails show no clubbing.  Psychiatric: She has a normal mood and affect. Her speech is  normal and behavior is normal. Judgment and thought content normal.     EKG:  Was ordered and interpreted by me today. Shows sinus bradycardia, 56 bpm, RBBB (known), improved T wave height   Recent Labs: 11/13/2016: BUN 32; Potassium 5.1; Sodium 141 01/13/2017: Creatinine, Ser 1.40 01/21/2017: Hemoglobin 9.6; Platelets 208  No results found for requested labs within last 8760 hours.   CrCl cannot be calculated (Patient's most recent lab result is older than the maximum 21 days allowed.).   Wt Readings from Last 3 Encounters:  03/12/17 203 lb (92.1 kg)  02/06/17 208 lb (94.3 kg)  01/28/17 212 lb 12 oz (96.5 kg)  Other studies reviewed: Additional studies/records reviewed today include: summarized above  ASSESSMENT AND PLAN:  1. CAD: No symptoms concerning for angina at this time. Recent nonischemic Myoview as above. Continue Plavix therapy alone, ASA was discontinued in 2018 in the setting of GI bleed. No plans for ischemic evaluation at this time.   2. Chronic dyspnea: Stable. Felt to be multifactorial. She does have Gr2DD, though does not appear grossly volume overloaded. She has eben advised to previously take Lasix 20 mg daily, though continues to take this only on a prn basis, and last took this the week prior. Given she does not appear grossly volume overloaded at this time, we will hold off on asking her to take Lasix daily and she will continue this on a prn basis. Continue to follow up with PCP and pulmonary.   3. Sinus bradycardia: Has demonstrated appropriate chronotropic response. Likely in the setting of clonidine patch of 0.3 mg weekly, which was previously reduced to 0.1 mg weekly, though has subsequently been increased to 0.2 mg weekly in the setting of poorly controlled hypertension and hyperkalemia in the setting of CKD requiring to holding of losartan. For now, given she is asymptomatic, we will continue her on her current dose of clonidine patch. Check bmet to assess  for hyperkalemia and renal function.   4. HTN: Blood pressure is elevated today at 326 mmHg systolic, though this is improved when compared to her prior recent readings. She reports this is on par with her readings at pulmonary rehab. She is not checking her BP at home, though she does have a cuff. She will start checking her BP twice daily and start hydralazine 25 mg bid prn systolic BP > 712 mmHg.   5. HLD: Statin.   6. Iron deficiency anemia: Followed by hematology.   Disposition: F/u with Dr. Rockey Situ or myself in 1 month.   Current medicines are reviewed at length with the patient today.  The patient did not have any concerns regarding medicines.  Signed, Christell Faith, PA-C 03/12/2017 11:12 AM     [facility] [address] [place] [address] [place], [place] [address] [phone]

## 2017-03-12 ENCOUNTER — Ambulatory Visit (INDEPENDENT_AMBULATORY_CARE_PROVIDER_SITE_OTHER): Payer: Medicare Other | Admitting: Physician Assistant

## 2017-03-12 ENCOUNTER — Encounter: Payer: Self-pay | Admitting: Physician Assistant

## 2017-03-12 VITALS — BP 160/50 | HR 56 | Ht 62.0 in | Wt 203.0 lb

## 2017-03-12 DIAGNOSIS — I2510 Atherosclerotic heart disease of native coronary artery without angina pectoris: Secondary | ICD-10-CM | POA: Diagnosis not present

## 2017-03-12 DIAGNOSIS — N189 Chronic kidney disease, unspecified: Secondary | ICD-10-CM

## 2017-03-12 DIAGNOSIS — I10 Essential (primary) hypertension: Secondary | ICD-10-CM

## 2017-03-12 DIAGNOSIS — R001 Bradycardia, unspecified: Secondary | ICD-10-CM

## 2017-03-12 DIAGNOSIS — R0609 Other forms of dyspnea: Secondary | ICD-10-CM | POA: Diagnosis not present

## 2017-03-12 DIAGNOSIS — E782 Mixed hyperlipidemia: Secondary | ICD-10-CM

## 2017-03-12 MED ORDER — HYDRALAZINE HCL 25 MG PO TABS: ORAL_TABLET | ORAL | 2 refills | Status: DC

## 2017-03-12 NOTE — Patient Instructions (Signed)
Medication Instructions: - Your physician has recommended you make the following change in your medication:  1) START hydralazine 25 mg- take 1 tablet by mouth TWICE daily as needed for systolic blood pressure (top number) > 160  Labwork: - Your physician recommends that you have lab work today: BMP  Procedures/Testing: - none ordered  Follow-Up: - Your physician recommends that you schedule a follow-up appointment in: 1 month with Dr. Louisa Second, PA/ Gerald Stabs, NP   Any Additional Special Instructions Will Be Listed Below (If Applicable).     If you need a refill on your cardiac medications before your next appointment, please call your pharmacy.

## 2017-03-13 ENCOUNTER — Other Ambulatory Visit: Payer: Self-pay | Admitting: *Deleted

## 2017-03-13 DIAGNOSIS — E875 Hyperkalemia: Secondary | ICD-10-CM

## 2017-03-13 LAB — BASIC METABOLIC PANEL
BUN/Creatinine Ratio: 19 (ref 12–28)
BUN: 23 mg/dL (ref 8–27)
CALCIUM: 9.4 mg/dL (ref 8.7–10.3)
CHLORIDE: 108 mmol/L — AB (ref 96–106)
CO2: 22 mmol/L (ref 20–29)
Creatinine, Ser: 1.18 mg/dL — ABNORMAL HIGH (ref 0.57–1.00)
GFR calc Af Amer: 48 mL/min/{1.73_m2} — ABNORMAL LOW (ref >59)
GFR calc non Af Amer: 42 mL/min/{1.73_m2} — ABNORMAL LOW (ref >59)
GLUCOSE: 134 mg/dL — AB (ref 65–99)
POTASSIUM: 5.3 mmol/L — AB (ref 3.5–5.2)
SODIUM: 145 mmol/L — AB (ref 134–144)

## 2017-03-17 ENCOUNTER — Other Ambulatory Visit
Admission: RE | Admit: 2017-03-17 | Discharge: 2017-03-17 | Disposition: A | Discharge status: Home / Self Care | Payer: Medicare Other | Source: Ambulatory Visit | Attending: Physician Assistant | Admitting: Physician Assistant

## 2017-03-17 DIAGNOSIS — E875 Hyperkalemia: Secondary | ICD-10-CM | POA: Insufficient documentation

## 2017-03-17 LAB — BASIC METABOLIC PANEL
Anion gap: 10 (ref 5–15)
BUN: 25 mg/dL — ABNORMAL HIGH (ref 6–20)
CHLORIDE: 109 mmol/L (ref 101–111)
CO2: 23 mmol/L (ref 22–32)
Calcium: 8.7 mg/dL — ABNORMAL LOW (ref 8.9–10.3)
Creatinine, Ser: 1.22 mg/dL — ABNORMAL HIGH (ref 0.44–1.00)
GFR, EST AFRICAN AMERICAN: 44 mL/min — AB (ref >60)
GFR, EST NON AFRICAN AMERICAN: 38 mL/min — AB (ref >60)
Glucose, Bld: 213 mg/dL — ABNORMAL HIGH (ref 65–99)
Potassium: 3.9 mmol/L (ref 3.5–5.1)
SODIUM: 142 mmol/L (ref 135–145)

## 2017-03-19 ENCOUNTER — Other Ambulatory Visit: Payer: Self-pay

## 2017-03-19 DIAGNOSIS — D509 Iron deficiency anemia, unspecified: Secondary | ICD-10-CM

## 2017-03-22 NOTE — Progress Notes (Signed)
[place]  [phone]) [phone] Fax:(336) [phone]  ID: Amy [name] OB: 1927/11/25  MR#: [id number]  SEG#:[id number]  Patient Care Team: Leonel Ramsay, MD as PCP - General (Infectious Diseases) Rockey Situ Kathlene November, MD as PCP - Cardiology (Cardiology) Minna Merritts, MD as Consulting Physician (Cardiology)  CHIEF COMPLAINT: Iron deficiency anemia secondary to chronic blood loss.  INTERVAL HISTORY: Patient returns to clinic for repeat laboratory work, further evaluation, and consideration of additional Feraheme.  She continues to have chronic weakness and fatigue that is unchanged.  She also has difficulty controlling her blood pressure and chronic shortness of breath that is being evaluated by cardiology. She has no neurologic complaints. She denies any recent fevers or illnesses. She denies any chest pain, cough, or hemoptysis.  She denies any nausea, vomiting, constipation, or diarrhea. She has no melena or hematochezia, but occasionally reports dark stools. She has no urinary complaints. Patient offers no further specific complaints today.  REVIEW OF SYSTEMS:   Review of Systems  Constitutional: Positive for malaise/fatigue. Negative for fever and weight loss.  HENT: Negative.   Respiratory: Positive for shortness of breath. Negative for cough and hemoptysis.   Cardiovascular: Negative.  Negative for chest pain and leg swelling.  Gastrointestinal: Negative.  Negative for abdominal pain, blood in stool and melena.  Genitourinary: Negative.  Negative for hematuria.  Musculoskeletal: Negative.   Skin: Negative.  Negative for rash.  Neurological: Positive for weakness. Negative for sensory change.  Psychiatric/Behavioral: Negative.  The patient is not nervous/anxious.     As per HPI. Otherwise, a complete review of systems is negative.  PAST MEDICAL HISTORY: Past Medical History:  Diagnosis Date  . Bladder incontinence   . CAD (coronary artery  disease)    a. 05/2009 Cath: LAD 90p (Xience 2.75 x 12 mm DES), 56m, D1 40, LCx 40p/m, RCA 30/40/30p/m, RCA 30/25d;  b.  12/2011 Lexiscan MV: no ischemia, breast attenuation artifact, normal EF-->Low risk; c. 10/2016 MV: fixed apical defect, most likely apical thinning and attenuation, No ischemia, EF 60%.  . Cancer ([place])    ovarian  . Carotid arterial disease w/ R Carotid Bruit (HCC)    a. 08/2016 Carotid U/S: 40-59% bilat ICA stenosis - f/u 1 yr.  . Chronic diastolic (congestive) heart failure ([place])    a. Echo 09/2015: EF 55-60% w/ Grade 1 DD, sev Ca2+ MV annulus, mildly dil LA; b. 09/2016 Echo: EF 65-70%, Gr2 DD, mildly dil LA/RA, nl RV fxn.  . Chronic Dyspnea on exertion   . CKD (chronic kidney disease) stage 3, GFR 30-59 ml/min (HCC)   . Degenerative arthritis of knee    bilateral knees  . Diabetes mellitus    Type II  . GIB (gastrointestinal bleeding)    a. 08/2016 Admission w/ presyncope/anemia/melena-->req Transfusion-->endo ok, colonoscopy w/ polyps but no source of bleeding (most likely diverticular).  . Hiatal hernia   . Hypertension   . Iron deficiency   . Menopausal symptoms   . Morbid obesity ([place])   . Renal insufficiency   . Thyroid disease    hypothyroidism    PAST SURGICAL HISTORY: Past Surgical History:  Procedure Laterality Date  . COLONOSCOPY  2015  . COLONOSCOPY WITH PROPOFOL N/A 09/25/2016   Procedure: COLONOSCOPY WITH PROPOFOL;  Surgeon: [name], MD;  Location[facility] ENDOSCOPY;  Service: Gastroenterology;  Laterality: N/A;  . COLONOSCOPY WITH PROPOFOL N/A 09/26/2016   Procedure: COLONOSCOPY WITH PROPOFOL;  Surgeon: [name], MD;  Location: [facility] ENDOSCOPY;  Service: Gastroenterology;  Laterality: N/A;  . ESOPHAGOGASTRODUODENOSCOPY (EGD) WITH PROPOFOL N/A 09/25/2016   Procedure: ESOPHAGOGASTRODUODENOSCOPY (EGD) WITH PROPOFOL;  Surgeon: [name], MD;  Location: [facility] ENDOSCOPY;  Service: Gastroenterology;  Laterality: N/A;  . REPLACEMENT TOTAL KNEE BILATERAL    .  TOTAL VAGINAL HYSTERECTOMY     ovarian mass, not cancerous  . UPPER GI ENDOSCOPY  2015    FAMILY HISTORY Family History  Problem Relation Age of Onset  . Breast cancer Mother 49       ADVANCED DIRECTIVES:    HEALTH MAINTENANCE: Social History   Tobacco Use  . Smoking status: Never Smoker  . Smokeless tobacco: Never Used  Substance Use Topics  . Alcohol use: No  . Drug use: No     Colonoscopy:  PAP:  Bone density:  Lipid panel:  Allergies  Allergen Reactions  . Losartan Other (See Comments)    Hyperkalemia  . Morphine And Related Shortness Of Breath    Rash, difficulty breathing, nausea.   . Atenolol Other (See Comments)    Other reaction(s): Other (See Comments) Decreased heart rate Decreased heart rate  . Codeine     Rash, difficulty breathing, nausea.  . Nsaids Other (See Comments)    Other reaction(s): Unknown  . Rofecoxib     Other reaction(s): Unknown  . Sucralfate Other (See Comments)    Throat tightness  . Sulfa Antibiotics     Other reaction(s): Unknown    Current Outpatient Medications  Medication Sig Dispense Refill  . amLODipine (NORVASC) 10 MG tablet Take by mouth.    . cloNIDine (CATAPRES - DOSED IN MG/24 HR) 0.2 mg/24hr patch Place 0.2 mg onto the skin once a week.    . clopidogrel (PLAVIX) 75 MG tablet Take 1 tablet (75 mg total) by mouth daily. 90 tablet 3  . docusate sodium (COLACE) 100 MG capsule Take 100 mg by mouth daily as needed for mild constipation.    Marland Kitchen doxazosin (CARDURA) 8 MG tablet TAKE ONE TABLET TWICE DAILY 180 tablet 0  . furosemide (LASIX) 20 MG tablet Take 1 tablet (20 mg total) by mouth daily. 90 tablet 3  . glimepiride (AMARYL) 1 MG tablet     . hydrALAZINE (APRESOLINE) 25 MG tablet Take 1 tablet (25 mg) by mouth twice daily as needed for systolic blood pressure (top number) > 160 30 tablet 2  . ipratropium-albuterol (DUONEB) 0.5-2.5 (3) MG/3ML SOLN Take 3 mLs by nebulization every 4 (four) hours as needed (shortness  of breath/wheezing.). 360 mL   . levothyroxine (SYNTHROID, LEVOTHROID) 125 MCG tablet Take 125 mcg by mouth daily.    . metFORMIN (GLUCOPHAGE) 1000 MG tablet Take 1,000 mg by mouth 2 (two) times daily with a meal.    . Multiple Vitamin (MULTIVITAMIN) tablet Take 1 tablet by mouth daily.    Marland Kitchen oxybutynin (DITROPAN) 5 MG tablet Take 5 mg by mouth as needed.     . senna (SENOKOT) 8.6 MG TABS tablet Take 1 tablet (8.6 mg total) by mouth daily as needed for mild constipation. 120 each 0  . simvastatin (ZOCOR) 40 MG tablet Take 1 tablet (40 mg total) by mouth at bedtime. 90 tablet 3  . SYMBICORT 160-4.5 MCG/ACT inhaler Inhale 2 puffs into the lungs 2 (two) times daily.      No current facility-administered medications for this visit.     OBJECTIVE: Vitals:   03/23/17 1115  BP: 104/74  Pulse: 81  Resp: 18  Temp: (!) 97.5 F (36.4 C)  Body mass index is 37.13 kg/m.    ECOG FS:2 - Symptomatic, <50% confined to bed  General: Well-developed, well-nourished, no acute distress. Eyes: Pink conjunctiva, anicteric sclera. Lungs: Scattered wheezing. Heart: Regular rate and rhythm. No rubs, murmurs, or gallops. Abdomen: Soft, nontender, nondistended. No organomegaly noted, normoactive bowel sounds. Musculoskeletal: No edema, cyanosis, or clubbing. Neuro: Alert, answering all questions appropriately. Cranial nerves grossly intact. Skin: No rashes or petechiae noted. Psych: Normal affect.  LAB RESULTS:  Lab Results  Component Value Date   NA 142 03/17/2017   K 3.9 03/17/2017   CL 109 03/17/2017   CO2 23 03/17/2017   GLUCOSE 213 (H) 03/17/2017   BUN 25 (H) 03/17/2017   CREATININE 1.22 (H) 03/17/2017   CALCIUM 8.7 (L) 03/17/2017   PROT 5.4 (L) 10/22/2015   ALBUMIN 3.5 10/22/2015   AST 15 10/22/2015   ALT 17 10/22/2015   ALKPHOS 60 10/22/2015   BILITOT 0.5 10/22/2015   GFRNONAA 38 (L) 03/17/2017   GFRAA 44 (L) 03/17/2017    Lab Results  Component Value Date   WBC 6.9 03/23/2017    NEUTROABS 4.9 03/23/2017   HGB 11.7 (L) 03/23/2017   HCT 35.6 03/23/2017   MCV 93.9 03/23/2017   PLT 217 03/23/2017   Lab Results  Component Value Date   IRON 28 01/21/2017   TIBC 317 01/21/2017   IRONPCTSAT 9 (L) 01/21/2017    Lab Results  Component Value Date   FERRITIN 22 01/21/2017    STUDIES: US Renal  Result Date: 02/23/2017 CLINICAL DATA:  Chronic renal insufficiency stage III EXAM: RENAL / URINARY TRACT ULTRASOUND COMPLETE COMPARISON:  None in PACs FINDINGS: Right Kidney: Length: 9.8 cm. The renal cortical echotexture is increased and is equal to or slightly greater than that of the adjacent liver. There is mild diffuse cortical thinning. There is a midpole cortical cyst demonstrating internal echoes which measures 2.0 x 1.3 x 1.2 cm. There is no hydronephrosis. Left Kidney: Length: 10.7 cm. The renal cortical echotexture is increased similar to that on the right. Degree of cortical thinning is not as conspicuous on the left as on the right. There is an exophytic upper pole simple appearing cystic structure measuring 1.2 x 1.1 x 1.2 cm. There is no hydronephrosis. Bladder: The urinary bladder is nondistended due to voiding prior to the study. IMPRESSION: Increased renal cortical echotexture bilaterally with mild cortical atrophy noted especially on the right. No hydronephrosis. On the right in the midpole there is a non simple appearing cystic structure measuring 2 x 1.3 x 1.2 cm. There is a simple appearing upper pole cortical cyst on the left. Renal protocol MRI would be a useful next imaging step if the patient can undergo the procedure. Alternatively a six-month follow-up ultrasound examination to reassess the non simple cystic structure in the right kidney could be performed. Electronically Signed   By: David  Martinique M.D.   On: 02/23/2017 15:12    ASSESSMENT:  Iron deficiency anemia secondary to chronic blood loss, shortness of breath.   PLAN:    1. Iron deficiency anemia  secondary to chronic blood loss: Previously, patient was reported to be intolerant of oral iron supplementation. She also had an EGD and colonoscopy in May 2015 for heme positive stools but only revealed esophagitis and tubular adenomas in her colon. Patient's hemoglobin has significantly improved is now nearly within normal limits.  Iron stores are pending at time of dictation.  She does not require additional ferriheme today.  Return to  clinic in 3 months with repeat laboratory work and further evaluation. 2. Pulmonary nodules: Repeat CT scan from January 13, 2017 revealed stable pulmonary nodules.  No further imaging is necessary.   3. Renal insufficiency: Mild, monitor. 4.  Hypertension: Patient's blood pressure is within normal limits today.  Continue monitoring and treatment per cardiology.  Approximately 20 minutes was spent in discussion of which greater than 50% was consultation.  Patient expressed understanding and was in agreement with this plan. She also understands that She can call clinic at any time with any questions, concerns, or complaints.    Lloyd Huger, MD   03/23/2017 11:33 AM

## 2017-03-23 ENCOUNTER — Inpatient Hospital Stay: Payer: Medicare Other

## 2017-03-23 ENCOUNTER — Inpatient Hospital Stay: Payer: Medicare Other | Attending: Oncology | Admitting: Oncology

## 2017-03-23 ENCOUNTER — Encounter: Payer: Self-pay | Admitting: Oncology

## 2017-03-23 ENCOUNTER — Telehealth: Payer: Self-pay | Admitting: Cardiovascular Disease

## 2017-03-23 VITALS — BP 104/74 | HR 81 | Temp 97.5°F | Resp 18 | Wt 203.0 lb

## 2017-03-23 DIAGNOSIS — R911 Solitary pulmonary nodule: Secondary | ICD-10-CM | POA: Diagnosis not present

## 2017-03-23 DIAGNOSIS — R531 Weakness: Secondary | ICD-10-CM | POA: Insufficient documentation

## 2017-03-23 DIAGNOSIS — I10 Essential (primary) hypertension: Secondary | ICD-10-CM | POA: Insufficient documentation

## 2017-03-23 DIAGNOSIS — R5382 Chronic fatigue, unspecified: Secondary | ICD-10-CM | POA: Diagnosis not present

## 2017-03-23 DIAGNOSIS — D509 Iron deficiency anemia, unspecified: Secondary | ICD-10-CM

## 2017-03-23 DIAGNOSIS — D500 Iron deficiency anemia secondary to blood loss (chronic): Secondary | ICD-10-CM | POA: Diagnosis not present

## 2017-03-23 DIAGNOSIS — N289 Disorder of kidney and ureter, unspecified: Secondary | ICD-10-CM | POA: Diagnosis not present

## 2017-03-23 DIAGNOSIS — R0602 Shortness of breath: Secondary | ICD-10-CM | POA: Diagnosis not present

## 2017-03-23 DIAGNOSIS — I5032 Chronic diastolic (congestive) heart failure: Secondary | ICD-10-CM

## 2017-03-23 LAB — CBC WITH DIFFERENTIAL/PLATELET
BASOS PCT: 0 %
Basophils Absolute: 0 10*3/uL (ref 0–0.1)
EOS ABS: 0.1 10*3/uL (ref 0–0.7)
EOS PCT: 2 %
HCT: 35.6 % (ref 35.0–47.0)
HEMOGLOBIN: 11.7 g/dL — AB (ref 12.0–16.0)
Lymphocytes Relative: 19 %
Lymphs Abs: 1.3 10*3/uL (ref 1.0–3.6)
MCH: 30.9 pg (ref 26.0–34.0)
MCHC: 32.9 g/dL (ref 32.0–36.0)
MCV: 93.9 fL (ref 80.0–100.0)
MONOS PCT: 8 %
Monocytes Absolute: 0.6 10*3/uL (ref 0.2–0.9)
NEUTROS PCT: 71 %
Neutro Abs: 4.9 10*3/uL (ref 1.4–6.5)
PLATELETS: 217 10*3/uL (ref 150–440)
RBC: 3.79 MIL/uL — ABNORMAL LOW (ref 3.80–5.20)
RDW: 15.9 % — ABNORMAL HIGH (ref 11.5–14.5)
WBC: 6.9 10*3/uL (ref 3.6–11.0)

## 2017-03-23 LAB — IRON AND TIBC
Iron: 60 ug/dL (ref 28–170)
SATURATION RATIOS: 23 % (ref 10.4–31.8)
TIBC: 265 ug/dL (ref 250–450)
UIBC: 205 ug/dL

## 2017-03-23 LAB — FERRITIN: FERRITIN: 74 ng/mL (ref 11–307)

## 2017-03-23 NOTE — Progress Notes (Signed)
Patient reports she has been having abdominal pain.

## 2017-03-23 NOTE — Telephone Encounter (Signed)
Pt c/o BP issue: STAT if pt c/o blurred vision, one-sided weakness or slurred speech  1. What are your last 5 BP readings?  Jan 24th (morning) - 201/ Jan 25th (morning) - 183/ Jan 26th (morning) - 187/ Jan 27 (night) - 169/   Jan 28 (morning) - 205/  2. Are you having any other symptoms (ex. Dizziness, headache, blurred vision, passed out)? Slight headache   3. What is your BP issue? Blood pressure is high - R. Dunn told her at last visit to get in touch with office if BP gets over 160  Please call to discuss

## 2017-03-23 NOTE — Telephone Encounter (Signed)
S/w patient.  Calling us with BP readings. Saw Ryan on 03/12/17. 1/24 201/67 am 1/25 183/49 am 1/26 187/74 am 1/27 169/70 am  1/28 205/73 am These readings all taken first thing in the morning prior to taking morning doses of doxazosin, furosemide and amlodipine. She is also taking hydralazine 25 mg at this time as well. In the evening, patient states she has not been consistent with taking BP or taking hydralazine and forgets to most nights.   Checked while on the phone and BP was 133/49, HR 71. Patient saw Dr Grayland Ormond today and it is noted that BP was 104/74, HR 81 in his office. Patient has chronic shortness of breath. Only complaint is slight headache at times.  Advised patient to monitor BP by taking in the morning as she does and then about 2-3 hours after medications in the morning as well as at night for the next several days. Once we get these readings, she may not need to check as many times a day. Reinforced taking the hydralazine in the evening if needed as well. Patient verbalized understanding and will call us back with the readings. Routing to [place] for preliminary review and advice.

## 2017-03-24 ENCOUNTER — Other Ambulatory Visit: Payer: Self-pay | Admitting: Cardiovascular Disease

## 2017-03-24 ENCOUNTER — Ambulatory Visit (INDEPENDENT_AMBULATORY_CARE_PROVIDER_SITE_OTHER): Payer: Medicare Other | Admitting: *Deleted

## 2017-03-24 VITALS — BP 169/20 | HR 82

## 2017-03-24 DIAGNOSIS — I10 Essential (primary) hypertension: Secondary | ICD-10-CM

## 2017-03-24 MED ORDER — HYDRALAZINE HCL 25 MG PO TABS: ORAL_TABLET | ORAL | 2 refills | Status: DC

## 2017-03-24 NOTE — Telephone Encounter (Signed)
S/w patient.  She verbalized understanding of instructions. She was a bit unsure if she had taken the hydralazine last night. In speaking with her she said her BP was 147/58 last night and; therefore, she did not take it because she did not need to. This morning BP was 177/73 and she took hydralazine with her normal morning medications. Patient will come by the office today sometime around 0930 to see if her BP cuff is correct.

## 2017-03-24 NOTE — Telephone Encounter (Signed)
Agree with taking hydralazine in the evening. Her home BP and BP at oncology do not match up. Please have her bring in her BP cuff for comparison. Would only check BP 2 times daily.

## 2017-03-24 NOTE — Progress Notes (Signed)
1.) Reason for visit: BP check  2.) Name of MD requesting visit: Christell Faith, PA-C  3.) H&P: HTN  4.) ROS related to problem: Patient here today to check BP and compare home BP cuff with manuel cuff here in the office. Patient uses a wrist cuff at home and it was 138/47, HR 82. Taken manually, BP 169/20, HR 82, 98% RA. Patient states shortness of breath is usual for her and it is at baseline. No other symptoms at this time.  5.) Assessment and plan per MD: Ignacia Bayley, NP notified and recommended to change hydralazine to standing order for 25 mg by mouth two times a day.    Recommended patient purchase a BP cuff for her arm instead of the wrist cuff. She verbalized understanding to start taking hydralazine 25 mg two times a day. She was taken to her Rehab class via wheelchair after this visit.

## 2017-03-26 ENCOUNTER — Other Ambulatory Visit: Payer: Medicare Other

## 2017-03-26 ENCOUNTER — Ambulatory Visit: Payer: Medicare Other

## 2017-03-31 ENCOUNTER — Telehealth: Payer: Self-pay | Admitting: Cardiovascular Disease

## 2017-03-31 ENCOUNTER — Ambulatory Visit (INDEPENDENT_AMBULATORY_CARE_PROVIDER_SITE_OTHER): Payer: Medicare Other

## 2017-03-31 VITALS — BP 132/55 | HR 82 | Resp 16

## 2017-03-31 DIAGNOSIS — Z0130 Encounter for examination of blood pressure without abnormal findings: Secondary | ICD-10-CM | POA: Diagnosis not present

## 2017-03-31 NOTE — Patient Instructions (Addendum)
1.) Reason for visit: BP check  2.) Name of MD requesting visit:   3.) H&P: Diastolic HF, HTN  4.) ROS related to problem: Received call from Arbie Cookey, Therapist, sports, at [facility]. Pt BP upon arrival 220/60. Recheck 188/70. Pt was not allowed to participate in the program today due to elevated BP. Upon recheck again, Arbie Cookey reports 180/0 and 182/0.   5.) Assessment and plan per MD: Patient came into the office for recheck of VS.   Manual: 140/20, HR 86  Automatic: 132/55, HR  82  Pt reports taking hydralazine 25mg  and cardura 8mg  this morning; wearing 0.2mg  clonidine patch. She did not take lasix d/t traveling to exercise class. She reports amlodipine has been discontinued. She states she feels fine but her left eye is "weak" with known macular degeneration for which she receives injections. Earlier today she felt "shaky"; blood sugar 150s. No symptoms.  Reviewed with Ignacia Bayley, NP. No medication change/change in plan of care is needed. I encouraged her to purchase an arm BP cuff as wrist cuff is inaccurate per 1/29 office visit. Pt verbalized understanding. States she feels well and able to drive home. I escorted her via wheelchair to the [place] entrance.

## 2017-03-31 NOTE — Telephone Encounter (Signed)
nurse Carrol Williford calling stating we adjuested patient's BP medication  But it doesn't seem to be helping  Before starting patient workout it was  BP was about 220/60 Then they had her sit for about 15 min and it was still high 188/70  Would like some advise on this  Please call back  Pt is there with them waiting.

## 2017-03-31 NOTE — Telephone Encounter (Signed)
Arbie Cookey calling back stating she just took BP in both arms   Left arm 180/0  Right arm 182/0

## 2017-03-31 NOTE — Telephone Encounter (Signed)
No answer at number provided.

## 2017-03-31 NOTE — Telephone Encounter (Signed)
S/w Arbie Cookey, RN at [facility] who reports manual BP 150/0 at this time. Patient reported feeling "swimmy headed and shaky" when she arrived to [facility]. Blood sugar 150s.  Patient confirmed with Arbie Cookey that she took all blood pressure medications this morning.  Arbie Cookey unsure if she should let patient leave.  She had 1/29 nurse visit for BP check. Hydralazine 25mg  BID added. She had been taking it PRN.  Patient will come to the office now for nurse visit.

## 2017-04-07 DIAGNOSIS — H353211 Exudative age-related macular degeneration, right eye, with active choroidal neovascularization: Secondary | ICD-10-CM | POA: Diagnosis not present

## 2017-04-11 NOTE — Progress Notes (Signed)
Cardiology Office Note  Date:  04/13/2017   ID:  Amy [name], DOB Nov 08, 1927, MRN [id number]  PCP:  Amy Ramsay, MD   Chief Complaint  Patient presents with  . other    1 month follow up.  Pt. c/o shortness of breath with little activity. Meds reviewed by the pt. verbally.     HPI:  Amy [name] is a pleasant 84 year old woman with a history of  coronary artery disease,  proximal LAD with stent  incontinence,  Beta blocker has been held in the past secondary to bradycardia.  lost her daughter, previous adjustment disorder Long history of poor diet COPD exacerbations morbid obesity Chronic baseline shortness of breath Echo 02/2015 EF 60%, mild AS, LVH Off losartan secondary to hyperkalemia, was on HCTZ Chronic SOB who now presents for routine follow up of her coronary artery disease  In follow-up today she reports having labile blood pressure Diastolic pressures running very low  Was previously on amlodipine, this appears to been held in the past month, timing unclear Possibly held for hypotension Denies having significant dizziness Rare dizziness  low blood pressures noted at cardiac rehab/[facility]  Presenting today in a wheelchair  deconditioned with leg weakness Neuropathy sometimes in her feet at nighttime, pins and needles in her feet, at night  Followed by pulmonary  completed pulmonary rehabilitation, feels her breathing is improving, stable  No significant leg swelling Takes Lasix occasionally,   Lab work reviewed Total chol 156/ LDL 74  EKG personally reviewed by myself on todays visit Oral sinus rhythm rate 58 bpm Right bundle branch block no significant ST or T wave changes   Total chol 148 BMP in 09/2015, creatinine 1.2 BUN 23 HBA1C 6.4 Vit D 6.3 Total chol 156, LDL 74  Other past medical history reviewed hospitalization mid August 2017 COPD exacerbation, required steroids, DuoNeb's, flutter device, antibiotics CT scan  showing mucus plugging, postobstructive atelectasis rehab, [facility] Now at home  Son in [place] helps her with her house chores  [facility] August 2015. She had diarrhea, vomiting, felt to be viral also with acute renal failure that improved with hydration. Creatinine reached 1.86, one month later creatinine 1.0 as an outpatient Continues to be weak, does not walk much and is deconditioned  Guaiac positive and she had colonoscopy and EGD. She reports Amy [name] did not find anything Iron transfusions in the past under the direction of Amy [name].   Prior episode of shortness of breath while walking in the hospital to schedule a colonoscopy appointment. sent to the emergency room for further evaluation. Systolic pressure was in the 170 range. workup at that time was essentially normal and she was discharged home.  cardiac catheter performed at [facility] on June 22, 2009 details 90% proximal LAD, 40% mid LAD, 40% diagonal, 60% proximal circumflex followed by 40% circumflex lesion, 30%, 40% and 30% lesion noted in the RCA with distal RCA with 30% and 25% lesions. Mild atheroma in the aorta. Xience 2.75 x 12 mm Drug eluting stent placed.   stress test 01/14/2012 showing no ischemia, Lexiscan  PMH:   has a past medical history of Bladder incontinence, CAD (coronary artery disease), Cancer ([place]), Carotid arterial disease w/ R Carotid Bruit (HCC), Chronic diastolic (congestive) heart failure (HCC), Chronic Dyspnea on exertion, CKD (chronic kidney disease) stage 3, GFR 30-59 ml/min (HCC), Degenerative arthritis of knee, Diabetes mellitus, GIB (gastrointestinal bleeding), Hiatal hernia, Hypertension, Iron deficiency, Menopausal symptoms, Morbid obesity ([place]), Renal insufficiency, and Thyroid disease.  PSH:    Past Surgical History:  Procedure Laterality Date  . COLONOSCOPY  2015  . COLONOSCOPY WITH PROPOFOL N/A 09/25/2016   Procedure: COLONOSCOPY WITH PROPOFOL;  Surgeon: Amy Bellows, MD;   Location: [facility] ENDOSCOPY;  Service: Gastroenterology;  Laterality: N/A;  . COLONOSCOPY WITH PROPOFOL N/A 09/26/2016   Procedure: COLONOSCOPY WITH PROPOFOL;  Surgeon: Amy Bellows, MD;  Location: [facility] ENDOSCOPY;  Service: Gastroenterology;  Laterality: N/A;  . ESOPHAGOGASTRODUODENOSCOPY (EGD) WITH PROPOFOL N/A 09/25/2016   Procedure: ESOPHAGOGASTRODUODENOSCOPY (EGD) WITH PROPOFOL;  Surgeon: Amy Bellows, MD;  Location: [facility] ENDOSCOPY;  Service: Gastroenterology;  Laterality: N/A;  . REPLACEMENT TOTAL KNEE BILATERAL    . TOTAL VAGINAL HYSTERECTOMY     ovarian mass, not cancerous  . UPPER GI ENDOSCOPY  2015    Current Outpatient Medications  Medication Sig Dispense Refill  . cloNIDine (CATAPRES - DOSED IN MG/24 HR) 0.2 mg/24hr patch Place 0.2 mg onto the skin once a week.    . clopidogrel (PLAVIX) 75 MG tablet Take 1 tablet (75 mg total) by mouth daily. 90 tablet 3  . docusate sodium (COLACE) 100 MG capsule Take 100 mg by mouth daily as needed for mild constipation.    Marland Kitchen doxazosin (CARDURA) 8 MG tablet TAKE ONE TABLET TWICE DAILY 180 tablet 3  . furosemide (LASIX) 20 MG tablet Take 1 tablet (20 mg total) by mouth daily. 90 tablet 3  . furosemide (LASIX) 20 MG tablet Take 20 mg by mouth daily as needed.    Marland Kitchen glimepiride (AMARYL) 1 MG tablet Take 1 mg by mouth daily with breakfast.     . hydrALAZINE (APRESOLINE) 25 MG tablet Take 1 tablet (25 mg) by mouth two times a day. 180 tablet 2  . ipratropium-albuterol (DUONEB) 0.5-2.5 (3) MG/3ML SOLN Take 3 mLs by nebulization every 4 (four) hours as needed (shortness of breath/wheezing.). 360 mL   . levothyroxine (SYNTHROID, LEVOTHROID) 125 MCG tablet Take 125 mcg by mouth daily.    . metFORMIN (GLUCOPHAGE) 1000 MG tablet Take 1,000 mg by mouth 2 (two) times daily with a meal.    . Multiple Vitamin (MULTIVITAMIN) tablet Take 1 tablet by mouth daily.    Marland Kitchen oxybutynin (DITROPAN) 5 MG tablet Take 5 mg by mouth as needed.     . senna (SENOKOT) 8.6 MG TABS tablet  Take 1 tablet (8.6 mg total) by mouth daily as needed for mild constipation. 120 each 0  . simvastatin (ZOCOR) 40 MG tablet Take 1 tablet (40 mg total) by mouth at bedtime. 90 tablet 3  . SYMBICORT 160-4.5 MCG/ACT inhaler Inhale 2 puffs into the lungs 2 (two) times daily.      No current facility-administered medications for this visit.      Allergies:   Losartan; Morphine and related; Atenolol; Codeine; Nsaids; Rofecoxib; Sucralfate; and Sulfa antibiotics   Social History:  The patient  reports that  has never smoked. she has never used smokeless tobacco. She reports that she does not drink alcohol or use drugs.   Family History:   family history includes Breast cancer (age of onset: 31) in her mother.    Review of Systems: Review of Systems  Constitutional: Negative.   Respiratory: Positive for shortness of breath.   Cardiovascular: Negative.   Gastrointestinal: Negative.   Musculoskeletal: Negative.        Leg weakness  Neurological: Negative.   Psychiatric/Behavioral: Negative.   All other systems reviewed and are negative.    PHYSICAL EXAM: VS:  BP (!) 160/50 (BP Location:  Left Arm, Patient Position: Sitting, Cuff Size: Normal)   Pulse (!) 58   Ht 5\' 2"  (1.575 m)   Wt 207 lb 8 oz (94.1 kg)   SpO2 98%   BMI 37.95 kg/m  , BMI Body mass index is 37.95 kg/m.  GEN: Well nourished, well developed, in no acute distress , Presents in a wheelchair, obese HEENT: normal  Neck: no JVD, carotid bruits, or masses Cardiac: RRR; no murmurs, rubs, or gallops,no edema  Respiratory:  clear to auscultation bilaterally, normal work of breathing GI: soft, nontender, nondistended, + BS MS: no deformity or atrophy  Skin: warm and dry, no rash Neuro:  Strength and sensation are intact Psych: euthymic mood, full affect    Recent Labs: 03/17/2017: BUN 25; Creatinine, Ser 1.22; Potassium 3.9; Sodium 142 03/23/2017: Hemoglobin 11.7; Platelets 217    Lipid Panel Lab Results  Component  Value Date   CHOL 135 06/25/2010   HDL 35 (L) 06/25/2010   LDLCALC 56 06/25/2010   TRIG 220 (H) 06/25/2010      Wt Readings from Last 3 Encounters:  04/13/17 207 lb 8 oz (94.1 kg)  03/23/17 203 lb (92.1 kg)  03/12/17 203 lb (92.1 kg)       ASSESSMENT AND PLAN:  SOB (shortness of breath) - Plan: EKG 12-Lead multifactorial including deconditioning, morbid obesity, chronic diastolic CHF, anemia She completed rehab now participating in [facility],  Limited in ability to lose weight  unable to rule out ischemia, stress test ordered as below   Coronary artery disease of native artery of native heart with stable angina pectoris (HCC) - Plan: EKG 12-Lead Worsening shortness of breath We will order pharmacologic Myoview as detailed above Known coronary artery disease and previous stenting, unable to treadmill   Essential hypertension - Plan: EKG 12-Lead She has had numerous medication changes since her last clinic visit with myself Labile pressures and low diastolic pressure likely secondary to the mix of her medications clonidine and Cardura. Amlodipine recently held though details unclear possibly for low diastolic pressure. Unable to exclude lower extremity edema Will likely need to change her medications, decrease clonidine dosing, hold the Cardura and change back to amlodipine Unable to use losartan Secondary to hyperkalemia  she was previously on HCTZ  Chronic diastolic CHF (congestive heart failure) ([place]) She takes Lasix periodically,   appears relatively euvolemic  Paroxysmal atrial fibrillation ([place]) - Plan: EKG 12-Lead Maintaining normal sinus rhythm Not on anticoagulation stable  COPD exacerbation (HCC) - Plan: EKG 12-Lead Reports no COPD exacerbation  working with rehab  Acute renal failure superimposed on stage 3 chronic kidney disease, unspecified acute renal failure type (HCC) stable , taking Lasix as needed Off HCTZ  Anemia, unspecified type - Followed  by Dr. Grayland Ormond, previous  iron infusion    Total encounter time more than 25 minutes  Greater than 50% was spent in counseling and coordination of care with the patient  Disposition:   F/U  6 months   Orders Placed This Encounter  Procedures  . NM Myocar Multi W/Spect W/[place] / EF  . EKG 12-Lead     Signed, Esmond Plants, M.D., Ph.D. 04/13/2017  [place], [place]

## 2017-04-13 ENCOUNTER — Ambulatory Visit (INDEPENDENT_AMBULATORY_CARE_PROVIDER_SITE_OTHER): Payer: Medicare Other | Admitting: Cardiovascular Disease

## 2017-04-13 ENCOUNTER — Encounter: Payer: Self-pay | Admitting: Cardiovascular Disease

## 2017-04-13 VITALS — BP 160/50 | HR 58 | Ht 62.0 in | Wt 207.5 lb

## 2017-04-13 DIAGNOSIS — I5032 Chronic diastolic (congestive) heart failure: Secondary | ICD-10-CM

## 2017-04-13 DIAGNOSIS — I25118 Atherosclerotic heart disease of native coronary artery with other forms of angina pectoris: Secondary | ICD-10-CM | POA: Diagnosis not present

## 2017-04-13 DIAGNOSIS — R0609 Other forms of dyspnea: Secondary | ICD-10-CM | POA: Diagnosis not present

## 2017-04-13 DIAGNOSIS — I10 Essential (primary) hypertension: Secondary | ICD-10-CM | POA: Diagnosis not present

## 2017-04-13 DIAGNOSIS — N189 Chronic kidney disease, unspecified: Secondary | ICD-10-CM

## 2017-04-13 DIAGNOSIS — I2510 Atherosclerotic heart disease of native coronary artery without angina pectoris: Secondary | ICD-10-CM | POA: Diagnosis not present

## 2017-04-13 DIAGNOSIS — E782 Mixed hyperlipidemia: Secondary | ICD-10-CM | POA: Diagnosis not present

## 2017-04-13 NOTE — Patient Instructions (Addendum)
Medication Instructions:   No medication changes made  Labwork:  No new labs needed  Testing/Procedures:  We will schedule a lexiscan myoview (on Wednesday 2/27 or Thursday 2/28) (1-day) for stable angina, SOB, known CAD, previous stent 2011   [place]  Your caregiver has ordered a Stress Test with nuclear imaging. The purpose of this test is to evaluate the blood supply to your heart muscle. This procedure is referred to as a "Non-Invasive Stress Test." This is because other than having an IV started in your vein, nothing is inserted or "invades" your body. Cardiac stress tests are done to find areas of poor blood flow to the heart by determining the extent of coronary artery disease (CAD). Some patients exercise on a treadmill, which naturally increases the blood flow to your heart, while others who are  unable to walk on a treadmill due to physical limitations have a pharmacologic/chemical stress agent called Lexiscan . This medicine will mimic walking on a treadmill by temporarily increasing your coronary blood flow.   Please note: these test may take anywhere between 2-4 hours to complete  PLEASE REPORT TO [name] AT THE FIRST DESK WILL DIRECT YOU WHERE TO GO  Date of Procedure:_____________________________________  Arrival Time for Procedure:______________________________  Instructions regarding medication:   _x___ : Hold diabetes medication morning of procedure (glimepiride, metformin)  _x___:  Hold other medications as follows: hold lasix (furosemide) the morning of your test .   PLEASE NOTIFY THE OFFICE AT LEAST 24 HOURS IN ADVANCE IF YOU ARE UNABLE TO [place].  [phone] AND  PLEASE NOTIFY NUCLEAR MEDICINE AT [facility] AT LEAST 24 HOURS IN ADVANCE IF YOU ARE UNABLE TO KEEP YOUR APPOINTMENT. [phone]  How to prepare for your Myoview test:  1. Do not eat or drink after midnight 2. No caffeine for 24 hours prior to  test 3. No smoking 24 hours prior to test. 4. Your medication may be taken with water.  If your doctor stopped a medication because of this test, do not take that medication. 5. Ladies, please do not wear dresses.  Skirts or pants are appropriate. Please wear a short sleeve shirt. 6. No perfume, cologne or lotion. 7. Wear comfortable walking shoes. No heels!   Follow-Up: It was a pleasure seeing you in the office today. Please call us if you have new issues that need to be addressed before your next appt.  [phone]  Your physician wants you to follow-up in: 6 months.  You will receive a reminder letter in the mail two months in advance. If you don't receive a letter, please call our office to schedule the follow-up appointment.  If you need a refill on your cardiac medications before your next appointment, please call your pharmacy.

## 2017-04-16 ENCOUNTER — Encounter: Payer: Self-pay | Admitting: Podiatry

## 2017-04-16 ENCOUNTER — Ambulatory Visit (INDEPENDENT_AMBULATORY_CARE_PROVIDER_SITE_OTHER): Payer: Medicare Other | Admitting: Podiatry

## 2017-04-16 DIAGNOSIS — E119 Type 2 diabetes mellitus without complications: Secondary | ICD-10-CM

## 2017-04-16 DIAGNOSIS — B351 Tinea unguium: Secondary | ICD-10-CM | POA: Diagnosis not present

## 2017-04-16 DIAGNOSIS — M79676 Pain in unspecified toe(s): Secondary | ICD-10-CM | POA: Diagnosis not present

## 2017-04-16 DIAGNOSIS — L608 Other nail disorders: Secondary | ICD-10-CM

## 2017-04-16 DIAGNOSIS — D689 Coagulation defect, unspecified: Secondary | ICD-10-CM

## 2017-04-16 NOTE — Progress Notes (Signed)
Complaint:  Visit Type: Patient returns to my office for continued preventative foot care services. Complaint: Patient states" my nails have grown long and thick and become painful to walk and wear shoes" Patient has been diagnosed with DM with no foot complications. The patient presents for preventative foot care services. No changes to ROS.  Patient is taking plavix.  Podiatric Exam: Vascular: dorsalis pedis and posterior tibial pulses are palpable bilateral. Capillary return is immediate. Temperature gradient is WNL. Skin turgor WNL  Sensorium: Normal Semmes Weinstein monofilament test. Normal tactile sensation bilaterally. Nail Exam: Pt has thick disfigured discolored nails with subungual debris noted bilateral entire nail hallux through fifth toenails Ulcer Exam: There is no evidence of ulcer or pre-ulcerative changes or infection. Orthopedic Exam: Muscle tone and strength are WNL. No limitations in general ROM. No crepitus or effusions noted. Foot type and digits show no abnormalities. Bony prominences are unremarkable. Skin: No Porokeratosis. No infection or ulcers  Diagnosis:  Onychomycosis, , Pain in right toe, pain in left toes  Treatment & Plan Procedures and Treatment: Consent by patient was obtained for treatment procedures. The patient understood the discussion of treatment and procedures well. All questions were answered thoroughly reviewed. Debridement of mycotic and hypertrophic toenails, 1 through 5 bilateral and clearing of subungual debris. No ulceration, no infection noted. ABN signed for 2018. Return Visit-Office Procedure: Patient instructed to return to the office for a follow up visit 10 weeks  for continued evaluation and treatment.    Gardiner Barefoot DPM

## 2017-04-22 ENCOUNTER — Ambulatory Visit
Admission: RE | Admit: 2017-04-22 | Discharge: 2017-04-22 | Disposition: A | Discharge status: Home / Self Care | Payer: Medicare Other | Source: Ambulatory Visit | Attending: Cardiovascular Disease | Admitting: Cardiovascular Disease

## 2017-04-22 DIAGNOSIS — I25118 Atherosclerotic heart disease of native coronary artery with other forms of angina pectoris: Secondary | ICD-10-CM

## 2017-04-22 MED ORDER — TECHNETIUM TC 99M TETROFOSMIN IV KIT
13.6300 | PACK | Freq: Once | INTRAVENOUS | Status: AC | PRN
  Administered 2017-04-22: 13.63 via INTRAVENOUS

## 2017-04-22 MED ORDER — REGADENOSON 0.4 MG/5ML IV SOLN
0.4000 mg | Freq: Once | INTRAVENOUS | Status: AC
  Administered 2017-04-22: 0.4 mg via INTRAVENOUS

## 2017-04-22 MED ORDER — TECHNETIUM TC 99M TETROFOSMIN IV KIT
33.6600 | PACK | Freq: Once | INTRAVENOUS | Status: AC | PRN
  Administered 2017-04-22: 33.66 via INTRAVENOUS

## 2017-04-23 LAB — NM MYOCAR MULTI W/SPECT W/WALL MOTION / EF
CHL CUP NUCLEAR SRS: 26
CHL CUP NUCLEAR SSS: 13
CSEPHR: 59 %
CSEPPHR: 78 {beats}/min
Estimated workload: 1 METS
Exercise duration (min): 0 min
Exercise duration (sec): 0 s
LV dias vol: 100 mL (ref 46–106)
LVSYSVOL: 44 mL
MPHR: 132 {beats}/min
NUC STRESS TID: 0.89
Rest HR: 56 {beats}/min
SDS: 2

## 2017-04-29 DIAGNOSIS — I2510 Atherosclerotic heart disease of native coronary artery without angina pectoris: Secondary | ICD-10-CM | POA: Diagnosis not present

## 2017-04-29 DIAGNOSIS — D500 Iron deficiency anemia secondary to blood loss (chronic): Secondary | ICD-10-CM | POA: Diagnosis not present

## 2017-04-29 DIAGNOSIS — I10 Essential (primary) hypertension: Secondary | ICD-10-CM | POA: Diagnosis not present

## 2017-04-29 DIAGNOSIS — N183 Chronic kidney disease, stage 3 (moderate): Secondary | ICD-10-CM | POA: Diagnosis not present

## 2017-04-29 DIAGNOSIS — E7801 Familial hypercholesterolemia: Secondary | ICD-10-CM | POA: Diagnosis not present

## 2017-04-29 DIAGNOSIS — E538 Deficiency of other specified B group vitamins: Secondary | ICD-10-CM | POA: Diagnosis not present

## 2017-04-29 DIAGNOSIS — E079 Disorder of thyroid, unspecified: Secondary | ICD-10-CM | POA: Diagnosis not present

## 2017-04-29 DIAGNOSIS — E1165 Type 2 diabetes mellitus with hyperglycemia: Secondary | ICD-10-CM | POA: Diagnosis not present

## 2017-05-04 DIAGNOSIS — H353222 Exudative age-related macular degeneration, left eye, with inactive choroidal neovascularization: Secondary | ICD-10-CM | POA: Diagnosis not present

## 2017-05-05 ENCOUNTER — Ambulatory Visit: Payer: Medicare Other | Admitting: Cardiovascular Disease

## 2017-05-11 ENCOUNTER — Telehealth: Payer: Self-pay | Admitting: *Deleted

## 2017-05-11 DIAGNOSIS — D509 Iron deficiency anemia, unspecified: Secondary | ICD-10-CM

## 2017-05-11 NOTE — Telephone Encounter (Signed)
Patient accepted appointment for Wednesday 3/20 @ 10 AM

## 2017-05-11 NOTE — Telephone Encounter (Signed)
-----   Message from Lloyd Huger, MD sent at 05/11/2017 10:32 AM EDT ----- Regarding: RE: pt wanting sooner appt Contact: [phone] See can come in for a lab only visit this week and will arrange f/u based on those results.    ----- Message ----- From: Wilburn Cornelia Sent: 05/11/2017  10:29 AM To: Lloyd Huger, MD, Wallene Dales, # Subject: pt wanting sooner appt                         Pt has appt late April but feeling weak-would like sooner appt please

## 2017-05-13 ENCOUNTER — Inpatient Hospital Stay: Payer: Medicare Other

## 2017-05-14 DIAGNOSIS — R079 Chest pain, unspecified: Secondary | ICD-10-CM | POA: Diagnosis not present

## 2017-05-14 DIAGNOSIS — N183 Chronic kidney disease, stage 3 (moderate): Secondary | ICD-10-CM | POA: Diagnosis not present

## 2017-05-14 DIAGNOSIS — E1165 Type 2 diabetes mellitus with hyperglycemia: Secondary | ICD-10-CM | POA: Diagnosis not present

## 2017-05-14 DIAGNOSIS — R0789 Other chest pain: Secondary | ICD-10-CM | POA: Diagnosis not present

## 2017-05-14 DIAGNOSIS — J441 Chronic obstructive pulmonary disease with (acute) exacerbation: Secondary | ICD-10-CM | POA: Diagnosis not present

## 2017-05-14 DIAGNOSIS — R042 Hemoptysis: Secondary | ICD-10-CM | POA: Diagnosis not present

## 2017-05-14 DIAGNOSIS — I4891 Unspecified atrial fibrillation: Secondary | ICD-10-CM | POA: Diagnosis not present

## 2017-05-14 DIAGNOSIS — J40 Bronchitis, not specified as acute or chronic: Secondary | ICD-10-CM | POA: Diagnosis not present

## 2017-05-15 ENCOUNTER — Other Ambulatory Visit: Payer: Self-pay

## 2017-05-15 ENCOUNTER — Emergency Department: Payer: Medicare Other

## 2017-05-15 ENCOUNTER — Encounter: Payer: Self-pay | Admitting: Emergency Medicine

## 2017-05-15 ENCOUNTER — Inpatient Hospital Stay
Admission: EM | Admit: 2017-05-15 | Discharge: 2017-05-20 | DRG: 871 | Disposition: A | Discharge status: Home Health | Payer: Medicare Other | Attending: Internal Medicine | Admitting: Internal Medicine

## 2017-05-15 DIAGNOSIS — J189 Pneumonia, unspecified organism: Secondary | ICD-10-CM | POA: Diagnosis not present

## 2017-05-15 DIAGNOSIS — I5033 Acute on chronic diastolic (congestive) heart failure: Secondary | ICD-10-CM | POA: Diagnosis present

## 2017-05-15 DIAGNOSIS — R042 Hemoptysis: Secondary | ICD-10-CM | POA: Diagnosis present

## 2017-05-15 DIAGNOSIS — N179 Acute kidney failure, unspecified: Secondary | ICD-10-CM | POA: Diagnosis present

## 2017-05-15 DIAGNOSIS — I10 Essential (primary) hypertension: Secondary | ICD-10-CM | POA: Diagnosis present

## 2017-05-15 DIAGNOSIS — I130 Hypertensive heart and chronic kidney disease with heart failure and stage 1 through stage 4 chronic kidney disease, or unspecified chronic kidney disease: Secondary | ICD-10-CM | POA: Diagnosis not present

## 2017-05-15 DIAGNOSIS — K922 Gastrointestinal hemorrhage, unspecified: Secondary | ICD-10-CM | POA: Diagnosis present

## 2017-05-15 DIAGNOSIS — E785 Hyperlipidemia, unspecified: Secondary | ICD-10-CM | POA: Diagnosis present

## 2017-05-15 DIAGNOSIS — Z7951 Long term (current) use of inhaled steroids: Secondary | ICD-10-CM

## 2017-05-15 DIAGNOSIS — M1710 Unilateral primary osteoarthritis, unspecified knee: Secondary | ICD-10-CM | POA: Diagnosis present

## 2017-05-15 DIAGNOSIS — Z7902 Long term (current) use of antithrombotics/antiplatelets: Secondary | ICD-10-CM

## 2017-05-15 DIAGNOSIS — A419 Sepsis, unspecified organism: Secondary | ICD-10-CM | POA: Diagnosis not present

## 2017-05-15 DIAGNOSIS — Z888 Allergy status to other drugs, medicaments and biological substances status: Secondary | ICD-10-CM

## 2017-05-15 DIAGNOSIS — Z79890 Hormone replacement therapy (postmenopausal): Secondary | ICD-10-CM

## 2017-05-15 DIAGNOSIS — K921 Melena: Secondary | ICD-10-CM | POA: Diagnosis present

## 2017-05-15 DIAGNOSIS — R0902 Hypoxemia: Secondary | ICD-10-CM | POA: Diagnosis not present

## 2017-05-15 DIAGNOSIS — I5032 Chronic diastolic (congestive) heart failure: Secondary | ICD-10-CM | POA: Diagnosis present

## 2017-05-15 DIAGNOSIS — I2510 Atherosclerotic heart disease of native coronary artery without angina pectoris: Secondary | ICD-10-CM | POA: Diagnosis present

## 2017-05-15 DIAGNOSIS — I480 Paroxysmal atrial fibrillation: Secondary | ICD-10-CM | POA: Diagnosis present

## 2017-05-15 DIAGNOSIS — I509 Heart failure, unspecified: Secondary | ICD-10-CM

## 2017-05-15 DIAGNOSIS — Z955 Presence of coronary angioplasty implant and graft: Secondary | ICD-10-CM

## 2017-05-15 DIAGNOSIS — D539 Nutritional anemia, unspecified: Secondary | ICD-10-CM | POA: Diagnosis present

## 2017-05-15 DIAGNOSIS — E039 Hypothyroidism, unspecified: Secondary | ICD-10-CM | POA: Diagnosis present

## 2017-05-15 DIAGNOSIS — Z8543 Personal history of malignant neoplasm of ovary: Secondary | ICD-10-CM

## 2017-05-15 DIAGNOSIS — Z9889 Other specified postprocedural states: Secondary | ICD-10-CM

## 2017-05-15 DIAGNOSIS — E1165 Type 2 diabetes mellitus with hyperglycemia: Secondary | ICD-10-CM | POA: Diagnosis present

## 2017-05-15 DIAGNOSIS — Z66 Do not resuscitate: Secondary | ICD-10-CM | POA: Diagnosis present

## 2017-05-15 DIAGNOSIS — IMO0002 Reserved for concepts with insufficient information to code with codable children: Secondary | ICD-10-CM | POA: Diagnosis present

## 2017-05-15 DIAGNOSIS — N183 Chronic kidney disease, stage 3 (moderate): Secondary | ICD-10-CM | POA: Diagnosis present

## 2017-05-15 DIAGNOSIS — J918 Pleural effusion in other conditions classified elsewhere: Secondary | ICD-10-CM | POA: Diagnosis not present

## 2017-05-15 DIAGNOSIS — J90 Pleural effusion, not elsewhere classified: Secondary | ICD-10-CM

## 2017-05-15 DIAGNOSIS — E1122 Type 2 diabetes mellitus with diabetic chronic kidney disease: Secondary | ICD-10-CM | POA: Diagnosis present

## 2017-05-15 DIAGNOSIS — Z6837 Body mass index (BMI) 37.0-37.9, adult: Secondary | ICD-10-CM

## 2017-05-15 DIAGNOSIS — R32 Unspecified urinary incontinence: Secondary | ICD-10-CM | POA: Diagnosis present

## 2017-05-15 DIAGNOSIS — Z882 Allergy status to sulfonamides status: Secondary | ICD-10-CM

## 2017-05-15 DIAGNOSIS — Z79899 Other long term (current) drug therapy: Secondary | ICD-10-CM

## 2017-05-15 DIAGNOSIS — Z885 Allergy status to narcotic agent status: Secondary | ICD-10-CM

## 2017-05-15 DIAGNOSIS — Z96653 Presence of artificial knee joint, bilateral: Secondary | ICD-10-CM | POA: Diagnosis present

## 2017-05-15 LAB — CBC WITH DIFFERENTIAL/PLATELET
BASOS ABS: 0.1 10*3/uL (ref 0–0.1)
BASOS PCT: 1 %
Eosinophils Absolute: 0 10*3/uL (ref 0–0.7)
Eosinophils Relative: 0 %
HEMATOCRIT: 25.5 % — AB (ref 35.0–47.0)
HEMOGLOBIN: 8.4 g/dL — AB (ref 12.0–16.0)
Lymphocytes Relative: 4 %
Lymphs Abs: 0.3 10*3/uL — ABNORMAL LOW (ref 1.0–3.6)
MCH: 30.2 pg (ref 26.0–34.0)
MCHC: 33.1 g/dL (ref 32.0–36.0)
MCV: 91.2 fL (ref 80.0–100.0)
MONOS PCT: 3 %
Monocytes Absolute: 0.2 10*3/uL (ref 0.2–0.9)
NEUTROS ABS: 6.3 10*3/uL (ref 1.4–6.5)
Neutrophils Relative %: 92 %
Platelets: 228 10*3/uL (ref 150–440)
RBC: 2.79 MIL/uL — AB (ref 3.80–5.20)
RDW: 15 % — ABNORMAL HIGH (ref 11.5–14.5)
WBC: 6.9 10*3/uL (ref 3.6–11.0)

## 2017-05-15 LAB — BASIC METABOLIC PANEL
ANION GAP: 11 (ref 5–15)
BUN: 38 mg/dL — ABNORMAL HIGH (ref 6–20)
CHLORIDE: 109 mmol/L (ref 101–111)
CO2: 19 mmol/L — AB (ref 22–32)
Calcium: 8.4 mg/dL — ABNORMAL LOW (ref 8.9–10.3)
Creatinine, Ser: 1.56 mg/dL — ABNORMAL HIGH (ref 0.44–1.00)
GFR calc non Af Amer: 29 mL/min — ABNORMAL LOW (ref >60)
GFR, EST AFRICAN AMERICAN: 33 mL/min — AB (ref >60)
Glucose, Bld: 284 mg/dL — ABNORMAL HIGH (ref 65–99)
Potassium: 5.6 mmol/L — ABNORMAL HIGH (ref 3.5–5.1)
SODIUM: 139 mmol/L (ref 135–145)

## 2017-05-15 LAB — HEPATIC FUNCTION PANEL
ALBUMIN: 2.9 g/dL — AB (ref 3.5–5.0)
ALT: 11 U/L — ABNORMAL LOW (ref 14–54)
AST: 26 U/L (ref 15–41)
Alkaline Phosphatase: 70 U/L (ref 38–126)
BILIRUBIN TOTAL: 0.5 mg/dL (ref 0.3–1.2)
Bilirubin, Direct: <0.1 mg/dL — ABNORMAL LOW (ref 0.1–0.5)
TOTAL PROTEIN: 5.9 g/dL — AB (ref 6.5–8.1)

## 2017-05-15 LAB — INFLUENZA PANEL BY PCR (TYPE A & B)
INFLAPCR: NEGATIVE
Influenza B By PCR: NEGATIVE

## 2017-05-15 LAB — LACTIC ACID, PLASMA: LACTIC ACID, VENOUS: 2.3 mmol/L — AB (ref 0.5–1.9)

## 2017-05-15 LAB — LIPASE, BLOOD: LIPASE: 26 U/L (ref 11–51)

## 2017-05-15 LAB — TROPONIN I

## 2017-05-15 MED ORDER — PANTOPRAZOLE SODIUM 40 MG IV SOLR
40.0000 mg | Freq: Two times a day (BID) | INTRAVENOUS | Status: DC
  Administered 2017-05-19 (×2): 40 mg via INTRAVENOUS
  Filled 2017-05-15 (×4): qty 40

## 2017-05-15 MED ORDER — SODIUM CHLORIDE 0.9 % IV SOLN
8.0000 mg/h | INTRAVENOUS | Status: AC
  Administered 2017-05-16 – 2017-05-18 (×6): 8 mg/h via INTRAVENOUS
  Filled 2017-05-15 (×6): qty 80

## 2017-05-15 MED ORDER — SODIUM CHLORIDE 0.9 % IV SOLN
1.0000 g | Freq: Once | INTRAVENOUS | Status: AC
  Administered 2017-05-15: 1 g via INTRAVENOUS
  Filled 2017-05-15: qty 10

## 2017-05-15 MED ORDER — INSULIN ASPART 100 UNIT/ML ~~LOC~~ SOLN
10.0000 [IU] | Freq: Once | SUBCUTANEOUS | Status: AC
  Administered 2017-05-15: 10 [IU] via INTRAVENOUS
  Filled 2017-05-15: qty 1

## 2017-05-15 MED ORDER — DEXTROSE 50 % IV SOLN
25.0000 mL | Freq: Once | INTRAVENOUS | Status: AC
  Administered 2017-05-15: 25 mL via INTRAVENOUS
  Filled 2017-05-15: qty 50

## 2017-05-15 MED ORDER — LEVOFLOXACIN IN D5W 750 MG/150ML IV SOLN
750.0000 mg | Freq: Once | INTRAVENOUS | Status: DC
  Filled 2017-05-15: qty 150

## 2017-05-15 MED ORDER — SODIUM CHLORIDE 0.9 % IV SOLN
80.0000 mg | Freq: Once | INTRAVENOUS | Status: AC
  Administered 2017-05-16: 01:00:00 80 mg via INTRAVENOUS
  Filled 2017-05-15: qty 80

## 2017-05-15 MED ORDER — DOXYCYCLINE HYCLATE 100 MG PO TABS
100.0000 mg | ORAL_TABLET | Freq: Once | ORAL | Status: AC
  Administered 2017-05-15: 100 mg via ORAL
  Filled 2017-05-15: qty 1

## 2017-05-15 MED ORDER — SODIUM CHLORIDE 0.9 % IV BOLUS (SEPSIS)
1000.0000 mL | Freq: Once | INTRAVENOUS | Status: AC
  Administered 2017-05-15: 1000 mL via INTRAVENOUS

## 2017-05-15 MED ORDER — SODIUM BICARBONATE 8.4 % IV SOLN
50.0000 meq | Freq: Once | INTRAVENOUS | Status: AC
  Administered 2017-05-15: 50 meq via INTRAVENOUS
  Filled 2017-05-15: qty 50

## 2017-05-15 NOTE — Progress Notes (Signed)
CODE SEPSIS - PHARMACY COMMUNICATION  **Broad Spectrum Antibiotics should be administered within 1 hour of Sepsis diagnosis**  Time Code Sepsis Called/Page Received 03/22 2300  Antibiotics Ordered: 03/22 2118  Time of 1st antibiotic administration: [id number]  Additional action taken by pharmacy:   If necessary, Name of Provider/Nurse Contacted:     Eloise Harman ,PharmD Clinical Pharmacist  05/15/2017  11:04 PM

## 2017-05-15 NOTE — ED Triage Notes (Signed)
Says diagnosed with tpneumonia by dr fitzgerald and toomy at [facility].  Sent here for admission.

## 2017-05-15 NOTE — ED Notes (Signed)
Family member came to desk to ask about wait times. This RN discussed the need for patient to stay for further evaluation. Family member verbalized understanding of information discussed, and reported that they would stay for further evaluation.

## 2017-05-15 NOTE — ED Provider Notes (Addendum)
[facility] Emergency Department Provider Note  ____________________________________________  Time seen: Approximately 10:09 PM  I have reviewed the triage vital signs and the nursing notes.   HISTORY  Chief Complaint Pneumonia   HPI Amy [name] is a 84 y.o. female with a history of CAD on Plavix, CKD, CHF, GIB, DM, and deficiency anemia, hypertension who presents from her pulmonologist office for admission for pneumonia.  Patient has been sick for 5 days with productive cough/ hemoptysis, shortness of breath, nausea, few episodes of vomiting and diarrhea.  She saw her primary care doctor and was started on azithromycin.  She did not improve and chest x-ray was worse therefore she was put on Levaquin.  She has taken 2 days of it and is still feeling worse.  She denies fever.  She is also complaining of right upper quadrant abdominal pain which has been constant dull and mild in intensity. Non pleuritic pain. She is complaining of mild constant shortness of breath for a week.  Also complaining of black tarry stools for a week.  No hematemesis, no CP. Last episode of hemoptysis was this am.    Past Medical History:  Diagnosis Date  . Bladder incontinence   . CAD (coronary artery disease)    a. 05/2009 Cath: LAD 90p (Xience 2.75 x 12 mm DES), 56m, D1 40, LCx 40p/m, RCA 30/40/30p/m, RCA 30/25d;  b.  12/2011 Lexiscan MV: no ischemia, breast attenuation artifact, normal EF-->Low risk; c. 10/2016 MV: fixed apical defect, most likely apical thinning and attenuation, No ischemia, EF 60%.  . Cancer ([place])    ovarian  . Carotid arterial disease w/ R Carotid Bruit (HCC)    a. 08/2016 Carotid U/S: 40-59% bilat ICA stenosis - f/u 1 yr.  . Chronic diastolic (congestive) heart failure ([place])    a. Echo 09/2015: EF 55-60% w/ Grade 1 DD, sev Ca2+ MV annulus, mildly dil LA; b. 09/2016 Echo: EF 65-70%, Gr2 DD, mildly dil LA/RA, nl RV fxn.  . Chronic Dyspnea on exertion   . CKD  (chronic kidney disease) stage 3, GFR 30-59 ml/min (HCC)   . Degenerative arthritis of knee    bilateral knees  . Diabetes mellitus    Type II  . GIB (gastrointestinal bleeding)    a. 08/2016 Admission w/ presyncope/anemia/melena-->req Transfusion-->endo ok, colonoscopy w/ polyps but no source of bleeding (most likely diverticular).  . Hiatal hernia   . Hypertension   . Iron deficiency   . Menopausal symptoms   . Morbid obesity ([place])   . Renal insufficiency   . Thyroid disease    hypothyroidism    Patient Active Problem List   Diagnosis Date Noted  . GI bleed 09/21/2016  . Vitamin D deficiency 11/04/2015  . Acute on chronic diastolic congestive heart failure ([place])   . Multiple lung nodules   . Acute bronchitis   . Bronchitis with obstruction ([place])   . New onset atrial fibrillation ([place]) 10/04/2015  . Anemia 10/04/2015  . COPD exacerbation ([place]) 10/04/2015  . Acute renal failure superimposed on stage 3 chronic kidney disease ([place]) 10/03/2015  . Acute on chronic diastolic (congestive) heart failure ([place]) 10/01/2015  . Advanced directives, counseling/discussion 06/05/2015  . Morbid obesity ([place]) 12/05/2014  . Angina pectoris associated with type 2 diabetes mellitus ([place]) 12/05/2014  . Diabetes type 2, uncontrolled ([place]) 12/05/2014  . Chronic diastolic CHF (congestive heart failure) ([place]) 09/06/2013  . Preop cardiovascular exam 06/03/2012  . Iron deficiency anemia 09/08/2011  . DYSPNEA 01/21/2010  .  Hyperlipidemia 07/18/2009  . CAD, NATIVE VESSEL 07/18/2009  . HYPOTHYROIDISM-IATROGENIC 07/12/2009  . Essential hypertension 07/12/2009    Past Surgical History:  Procedure Laterality Date  . COLONOSCOPY  2015  . COLONOSCOPY WITH PROPOFOL N/A 09/25/2016   Procedure: COLONOSCOPY WITH PROPOFOL;  Surgeon: [name], MD;  Location: [facility] ENDOSCOPY;  Service: Gastroenterology;  Laterality: N/A;  . COLONOSCOPY WITH PROPOFOL N/A 09/26/2016   Procedure: COLONOSCOPY WITH PROPOFOL;   Surgeon: [name], MD;  Location: [facility] ENDOSCOPY;  Service: Gastroenterology;  Laterality: N/A;  . ESOPHAGOGASTRODUODENOSCOPY (EGD) WITH PROPOFOL N/A 09/25/2016   Procedure: ESOPHAGOGASTRODUODENOSCOPY (EGD) WITH PROPOFOL;  Surgeon: [name], MD;  Location: [facility] ENDOSCOPY;  Service: Gastroenterology;  Laterality: N/A;  . REPLACEMENT TOTAL KNEE BILATERAL    . TOTAL VAGINAL HYSTERECTOMY     ovarian mass, not cancerous  . UPPER GI ENDOSCOPY  2015    Prior to Admission medications   Medication Sig Start Date End Date Taking? Authorizing Provider  cloNIDine (CATAPRES - DOSED IN MG/24 HR) 0.2 mg/24hr patch Place 0.2 mg onto the skin once a week.    [provider]  clopidogrel (PLAVIX) 75 MG tablet Take 1 tablet (75 mg total) by mouth daily. 09/27/16   Dustin Flock, MD  docusate sodium (COLACE) 100 MG capsule Take 100 mg by mouth daily as needed for mild constipation.    [provider]  doxazosin (CARDURA) 8 MG tablet TAKE ONE TABLET TWICE DAILY 03/24/17   Minna Merritts, MD  furosemide (LASIX) 20 MG tablet Take 1 tablet (20 mg total) by mouth daily. 10/22/16   Theora Gianotti, NP  furosemide (LASIX) 20 MG tablet Take 20 mg by mouth daily as needed.    [provider]  glimepiride (AMARYL) 1 MG tablet Take 1 mg by mouth daily with breakfast.  11/19/16   [provider]  hydrALAZINE (APRESOLINE) 25 MG tablet Take 1 tablet (25 mg) by mouth two times a day. 03/24/17   Minna Merritts, MD  ipratropium-albuterol (DUONEB) 0.5-2.5 (3) MG/3ML SOLN Take 3 mLs by nebulization every 4 (four) hours as needed (shortness of breath/wheezing.). 10/11/15   Henreitta Leber, MD  levothyroxine (SYNTHROID, LEVOTHROID) 125 MCG tablet Take 125 mcg by mouth daily.    [provider]  metFORMIN (GLUCOPHAGE) 1000 MG tablet Take 1,000 mg by mouth 2 (two) times daily with a meal.    [provider]  Multiple Vitamin (MULTIVITAMIN) tablet Take 1 tablet by  mouth daily.    [provider]  oxybutynin (DITROPAN) 5 MG tablet Take 5 mg by mouth as needed.     [provider]  senna (SENOKOT) 8.6 MG TABS tablet Take 1 tablet (8.6 mg total) by mouth daily as needed for mild constipation. 10/11/15   Henreitta Leber, MD  simvastatin (ZOCOR) 40 MG tablet Take 1 tablet (40 mg total) by mouth at bedtime. 08/22/16   Minna Merritts, MD  SYMBICORT 160-4.5 MCG/ACT inhaler Inhale 2 puffs into the lungs 2 (two) times daily.  11/15/14   [provider]    Allergies Losartan; Morphine and related; Atenolol; Codeine; Nsaids; Rofecoxib; Sucralfate; and Sulfa antibiotics  Family History  Problem Relation Age of Onset  . Breast cancer Mother 41    Social History Social History   Tobacco Use  . Smoking status: Never Smoker  . Smokeless tobacco: Never Used  Substance Use Topics  . Alcohol use: No  . Drug use: No    Review of Systems  Constitutional: Negative  for fever. Eyes: Negative for visual changes. ENT: Negative for sore throat. Neck: No neck pain  Cardiovascular: Negative for chest pain. Respiratory: + shortness of breath, cough, hemoptysis Gastrointestinal: + RUQ abdominal pain, vomiting, and melena. Genitourinary: Negative for dysuria. Musculoskeletal: Negative for back pain. Skin: Negative for rash. Neurological: Negative for headaches, weakness or numbness. Psych: No SI or HI  ____________________________________________   PHYSICAL EXAM:  VITAL SIGNS: ED Triage Vitals  Enc Vitals Group     BP 05/15/17 1530 (!) 153/38     Pulse Rate 05/15/17 1530 60     Resp 05/15/17 1530 20     Temp 05/15/17 1530 98.5 F (36.9 C)     Temp Source 05/15/17 1530 Oral     SpO2 05/15/17 1530 94 %     Weight 05/15/17 1531 205 lb (93 kg)     Height 05/15/17 1531 5\' 2"  (1.575 m)     Head Circumference --      Peak Flow --      Pain Score 05/15/17 1530 3     Pain Loc --      Pain Edu? --      Excl. in [place]? --      Constitutional: Alert and oriented. Well appearing and in no apparent distress. HEENT:      Head: Normocephalic and atraumatic.         Eyes: Conjunctivae are normal. Sclera is non-icteric.       Mouth/Throat: Mucous membranes are moist.       Neck: Supple with no signs of meningismus. Cardiovascular: Regular rate and rhythm. No murmurs, gallops, or rubs. 2+ symmetrical distal pulses are present in all extremities. No JVD. Respiratory: Normal respiratory effort. Lungs are clear to auscultation bilaterally. No wheezes, crackles, or rhonchi.  Gastrointestinal: Soft, non tender, and non distended with positive bowel sounds. No rebound or guarding. Genitourinary: No CVA tenderness.  Rectal exam showed black stool guaiac positive  musculoskeletal: Nontender with normal range of motion in all extremities. No edema, cyanosis, or erythema of extremities. Neurologic: Normal speech and language. Face is symmetric. Moving all extremities. No gross focal neurologic deficits are appreciated. Skin: Skin is warm, dry and intact. No rash noted. Psychiatric: Mood and affect are normal. Speech and behavior are normal.  ____________________________________________   LABS (all labs ordered are listed, but only abnormal results are displayed)  Labs Reviewed  CBC WITH DIFFERENTIAL/PLATELET - Abnormal; Notable for the following components:      Result Value   RBC 2.79 (*)    Hemoglobin 8.4 (*)    HCT 25.5 (*)    RDW 15.0 (*)    Lymphs Abs 0.3 (*)    All other components within normal limits  BASIC METABOLIC PANEL - Abnormal; Notable for the following components:   Potassium 5.6 (*)    CO2 19 (*)    Glucose, Bld 284 (*)    BUN 38 (*)    Creatinine, Ser 1.56 (*)    Calcium 8.4 (*)    GFR calc non Af Amer 29 (*)    GFR calc Af Amer 33 (*)    All other components within normal limits  HEPATIC FUNCTION PANEL - Abnormal; Notable for the following components:   Total Protein 5.9 (*)    Albumin 2.9  (*)    ALT 11 (*)    Bilirubin, Direct <0.1 (*)    All other components within normal limits  LACTIC ACID, PLASMA - Abnormal; Notable for the following components:  Lactic Acid, Venous 2.3 (*)    All other components within normal limits  CULTURE, BLOOD (ROUTINE X 2)  CULTURE, BLOOD (ROUTINE X 2)  TROPONIN I  LIPASE, BLOOD  INFLUENZA PANEL BY PCR (TYPE A & B)  LACTIC ACID, PLASMA  LACTIC ACID, PLASMA  TYPE AND SCREEN   ____________________________________________  EKG  ED ECG REPORT I, Rudene Re, the attending physician, personally viewed and interpreted this ECG.  Normal sinus rhythm with first-degree AV block, right bundle branch block, no ST elevations or depressions.  Unchanged from prior. ____________________________________________  RADIOLOGY  I have personally reviewed the images performed during this visit and I agree with the Radiologist's read.   Interpretation by Radiologist:  Dg Chest 2 View  Result Date: 05/15/2017 CLINICAL DATA:  Pneumonia EXAM: CHEST - 2 VIEW COMPARISON:  05/14/2017 FINDINGS: Patchy airspace disease has not developed throughout both lungs. It has a nodular appearance. This has progressed since the prior study. Cardiomegaly. No pneumothorax. No pleural effusion. IMPRESSION: Airspace disease has worsened. There is now patchy airspace disease throughout both lungs. Electronically Signed   By: Marybelle Killings M.D.   On: 05/15/2017 16:19   ____________________________________________   PROCEDURES  Procedure(s) performed: None Procedures Critical Care performed:  CRITICAL CARE Performed by: Rudene Re   Total critical care time: 35 minutes  Critical care time was exclusive of separately billable procedures and treating other patients.  Critical care was necessary to treat or prevent imminent or life-threatening deterioration.  Critical care was time spent personally by me on the following activities: development of treatment  plan with patient and/or surrogate as well as nursing, discussions with consultants, evaluation of patient's response to treatment, examination of patient, obtaining history from patient or surrogate, ordering and performing treatments and interventions, ordering and review of laboratory studies, ordering and review of radiographic studies, pulse oximetry and re-evaluation of patient's condition.  ____________________________________________   INITIAL IMPRESSION / ASSESSMENT AND PLAN / ED COURSE  84 y.o. female with a history of CAD on Plavix, CKD, CHF, GIB, DM, and deficiency anemia, hypertension who presents from her pulmonologist office for admission for pneumonia after failing to outpatient antibiotics.  Lactic elevated concerning for sepsis.  Patient has worsening anemia and has had hemoptysis for a few days and melena for a few days which is ongoing.  We will start her on Protonix.  Type and screen has been sent.  No indication for transfusion at this time.  Influenza is pending.  Patient was started on Rocephin and doxycycline.  She is not wheezing and moving good air with normal work of breathing.  She is going to be admitted to the hospitalist service.      As part of my medical decision making, I reviewed the following data within the [place] notes reviewed and incorporated, Labs reviewed , EKG interpreted , Old EKG reviewed, Old chart reviewed, Radiograph reviewed , Discussed with admitting physician , Notes from prior ED visits and [place] Controlled Substance Database    Pertinent labs & imaging results that were available during my care of the patient were reviewed by me and considered in my medical decision making (see chart for details).    ____________________________________________   FINAL CLINICAL IMPRESSION(S) / ED DIAGNOSES  Final diagnoses:  Community acquired pneumonia, unspecified laterality  UGIB (upper gastrointestinal bleed)  Hemoptysis   Sepsis, due to unspecified organism ([place])      NEW MEDICATIONS STARTED DURING THIS VISIT:  ED Discharge Orders  None       Note:  This document was prepared using Dragon voice recognition software and may include unintentional dictation errors.    Alfred Levins, [place], MD 05/15/17 [place], [place], MD 05/15/17 [phone]

## 2017-05-15 NOTE — ED Notes (Signed)
First Nurse Note:  Patient states she was sent to the ED from Dr. Blane Ohara office due to diagnosis of pneumonia.

## 2017-05-15 NOTE — ED Notes (Signed)
Pt given peanut butter crackers and diet sprite, pt states she has not eaten since arrival and gets sick when she takes pills on empty stomach.

## 2017-05-16 ENCOUNTER — Other Ambulatory Visit: Payer: Self-pay

## 2017-05-16 DIAGNOSIS — D500 Iron deficiency anemia secondary to blood loss (chronic): Secondary | ICD-10-CM | POA: Diagnosis not present

## 2017-05-16 DIAGNOSIS — E039 Hypothyroidism, unspecified: Secondary | ICD-10-CM | POA: Diagnosis present

## 2017-05-16 DIAGNOSIS — R042 Hemoptysis: Secondary | ICD-10-CM | POA: Diagnosis not present

## 2017-05-16 DIAGNOSIS — I5032 Chronic diastolic (congestive) heart failure: Secondary | ICD-10-CM | POA: Diagnosis not present

## 2017-05-16 DIAGNOSIS — K921 Melena: Secondary | ICD-10-CM | POA: Diagnosis not present

## 2017-05-16 DIAGNOSIS — J189 Pneumonia, unspecified organism: Secondary | ICD-10-CM | POA: Diagnosis present

## 2017-05-16 DIAGNOSIS — Z66 Do not resuscitate: Secondary | ICD-10-CM | POA: Diagnosis present

## 2017-05-16 DIAGNOSIS — N179 Acute kidney failure, unspecified: Secondary | ICD-10-CM | POA: Diagnosis not present

## 2017-05-16 DIAGNOSIS — Z79890 Hormone replacement therapy (postmenopausal): Secondary | ICD-10-CM | POA: Diagnosis not present

## 2017-05-16 DIAGNOSIS — D509 Iron deficiency anemia, unspecified: Secondary | ICD-10-CM | POA: Diagnosis not present

## 2017-05-16 DIAGNOSIS — Z48813 Encounter for surgical aftercare following surgery on the respiratory system: Secondary | ICD-10-CM | POA: Diagnosis not present

## 2017-05-16 DIAGNOSIS — E1122 Type 2 diabetes mellitus with diabetic chronic kidney disease: Secondary | ICD-10-CM | POA: Diagnosis present

## 2017-05-16 DIAGNOSIS — Z96653 Presence of artificial knee joint, bilateral: Secondary | ICD-10-CM | POA: Diagnosis present

## 2017-05-16 DIAGNOSIS — Z79899 Other long term (current) drug therapy: Secondary | ICD-10-CM | POA: Diagnosis not present

## 2017-05-16 DIAGNOSIS — I480 Paroxysmal atrial fibrillation: Secondary | ICD-10-CM | POA: Diagnosis present

## 2017-05-16 DIAGNOSIS — R0602 Shortness of breath: Secondary | ICD-10-CM | POA: Diagnosis not present

## 2017-05-16 DIAGNOSIS — D62 Acute posthemorrhagic anemia: Secondary | ICD-10-CM | POA: Diagnosis not present

## 2017-05-16 DIAGNOSIS — A419 Sepsis, unspecified organism: Secondary | ICD-10-CM | POA: Diagnosis present

## 2017-05-16 DIAGNOSIS — K922 Gastrointestinal hemorrhage, unspecified: Secondary | ICD-10-CM | POA: Diagnosis not present

## 2017-05-16 DIAGNOSIS — Z955 Presence of coronary angioplasty implant and graft: Secondary | ICD-10-CM | POA: Diagnosis not present

## 2017-05-16 DIAGNOSIS — I25118 Atherosclerotic heart disease of native coronary artery with other forms of angina pectoris: Secondary | ICD-10-CM

## 2017-05-16 DIAGNOSIS — I2510 Atherosclerotic heart disease of native coronary artery without angina pectoris: Secondary | ICD-10-CM | POA: Diagnosis present

## 2017-05-16 DIAGNOSIS — E785 Hyperlipidemia, unspecified: Secondary | ICD-10-CM | POA: Diagnosis present

## 2017-05-16 DIAGNOSIS — R32 Unspecified urinary incontinence: Secondary | ICD-10-CM | POA: Diagnosis present

## 2017-05-16 DIAGNOSIS — N183 Chronic kidney disease, stage 3 (moderate): Secondary | ICD-10-CM | POA: Diagnosis present

## 2017-05-16 DIAGNOSIS — J918 Pleural effusion in other conditions classified elsewhere: Secondary | ICD-10-CM | POA: Diagnosis not present

## 2017-05-16 DIAGNOSIS — Z6837 Body mass index (BMI) 37.0-37.9, adult: Secondary | ICD-10-CM | POA: Diagnosis not present

## 2017-05-16 DIAGNOSIS — J90 Pleural effusion, not elsewhere classified: Secondary | ICD-10-CM | POA: Diagnosis not present

## 2017-05-16 DIAGNOSIS — I509 Heart failure, unspecified: Secondary | ICD-10-CM | POA: Diagnosis not present

## 2017-05-16 DIAGNOSIS — I5033 Acute on chronic diastolic (congestive) heart failure: Secondary | ICD-10-CM | POA: Diagnosis not present

## 2017-05-16 DIAGNOSIS — M1710 Unilateral primary osteoarthritis, unspecified knee: Secondary | ICD-10-CM | POA: Diagnosis present

## 2017-05-16 DIAGNOSIS — D539 Nutritional anemia, unspecified: Secondary | ICD-10-CM | POA: Diagnosis present

## 2017-05-16 DIAGNOSIS — I130 Hypertensive heart and chronic kidney disease with heart failure and stage 1 through stage 4 chronic kidney disease, or unspecified chronic kidney disease: Secondary | ICD-10-CM | POA: Diagnosis present

## 2017-05-16 DIAGNOSIS — Z8543 Personal history of malignant neoplasm of ovary: Secondary | ICD-10-CM | POA: Diagnosis not present

## 2017-05-16 LAB — RETICULOCYTES
RBC.: 2.59 MIL/uL — ABNORMAL LOW (ref 3.80–5.20)
RETIC COUNT ABSOLUTE: 38.9 10*3/uL (ref 19.0–183.0)
Retic Ct Pct: 1.5 % (ref 0.4–3.1)

## 2017-05-16 LAB — LACTIC ACID, PLASMA
LACTIC ACID, VENOUS: 2.3 mmol/L — AB (ref 0.5–1.9)
Lactic Acid, Venous: 1.5 mmol/L (ref 0.5–1.9)

## 2017-05-16 LAB — IRON AND TIBC
IRON: 19 ug/dL — AB (ref 28–170)
Saturation Ratios: 8 % — ABNORMAL LOW (ref 10.4–31.8)
TIBC: 239 ug/dL — AB (ref 250–450)
UIBC: 220 ug/dL

## 2017-05-16 LAB — CBC
HEMATOCRIT: 23.6 % — AB (ref 35.0–47.0)
HEMOGLOBIN: 7.8 g/dL — AB (ref 12.0–16.0)
MCH: 29.6 pg (ref 26.0–34.0)
MCHC: 33 g/dL (ref 32.0–36.0)
MCV: 89.6 fL (ref 80.0–100.0)
Platelets: 225 10*3/uL (ref 150–440)
RBC: 2.63 MIL/uL — AB (ref 3.80–5.20)
RDW: 14.7 % — ABNORMAL HIGH (ref 11.5–14.5)
WBC: 7.8 10*3/uL (ref 3.6–11.0)

## 2017-05-16 LAB — BASIC METABOLIC PANEL
ANION GAP: 7 (ref 5–15)
BUN: 36 mg/dL — ABNORMAL HIGH (ref 6–20)
CALCIUM: 7.8 mg/dL — AB (ref 8.9–10.3)
CHLORIDE: 113 mmol/L — AB (ref 101–111)
CO2: 23 mmol/L (ref 22–32)
Creatinine, Ser: 1.45 mg/dL — ABNORMAL HIGH (ref 0.44–1.00)
GFR calc Af Amer: 36 mL/min — ABNORMAL LOW (ref >60)
GFR calc non Af Amer: 31 mL/min — ABNORMAL LOW (ref >60)
GLUCOSE: 127 mg/dL — AB (ref 65–99)
POTASSIUM: 4.5 mmol/L (ref 3.5–5.1)
Sodium: 143 mmol/L (ref 135–145)

## 2017-05-16 LAB — VITAMIN B12: Vitamin B-12: 488 pg/mL (ref 180–914)

## 2017-05-16 LAB — GLUCOSE, CAPILLARY
GLUCOSE-CAPILLARY: 150 mg/dL — AB (ref 65–99)
Glucose-Capillary: 119 mg/dL — ABNORMAL HIGH (ref 65–99)
Glucose-Capillary: 167 mg/dL — ABNORMAL HIGH (ref 65–99)
Glucose-Capillary: 166 mg/dL — ABNORMAL HIGH (ref 65–99)

## 2017-05-16 LAB — FERRITIN: FERRITIN: 44 ng/mL (ref 11–307)

## 2017-05-16 LAB — FOLATE: FOLATE: 29 ng/mL (ref >5.9)

## 2017-05-16 MED ORDER — DOXAZOSIN MESYLATE 4 MG PO TABS
8.0000 mg | ORAL_TABLET | Freq: Two times a day (BID) | ORAL | Status: DC
  Administered 2017-05-17 – 2017-05-20 (×7): 8 mg via ORAL
  Filled 2017-05-16 (×8): qty 2

## 2017-05-16 MED ORDER — SODIUM CHLORIDE 0.9 % IV SOLN
1.0000 g | INTRAVENOUS | Status: DC
  Administered 2017-05-16 – 2017-05-19 (×4): 1 g via INTRAVENOUS
  Filled 2017-05-16 (×5): qty 10

## 2017-05-16 MED ORDER — MOMETASONE FURO-FORMOTEROL FUM 200-5 MCG/ACT IN AERO
2.0000 | INHALATION_SPRAY | Freq: Two times a day (BID) | RESPIRATORY_TRACT | Status: DC
  Administered 2017-05-16 – 2017-05-20 (×9): 2 via RESPIRATORY_TRACT
  Filled 2017-05-16: qty 8.8

## 2017-05-16 MED ORDER — SODIUM CHLORIDE 0.9 % IV SOLN
INTRAVENOUS | Status: DC
  Administered 2017-05-16: 03:00:00 via INTRAVENOUS

## 2017-05-16 MED ORDER — ONDANSETRON HCL 4 MG PO TABS
4.0000 mg | ORAL_TABLET | Freq: Four times a day (QID) | ORAL | Status: DC | PRN
  Administered 2017-05-18: 4 mg via ORAL
  Filled 2017-05-16: qty 1

## 2017-05-16 MED ORDER — CLONIDINE HCL 0.2 MG/24HR TD PTWK
0.2000 mg | MEDICATED_PATCH | TRANSDERMAL | Status: DC
  Administered 2017-05-16: 0.2 mg via TRANSDERMAL
  Filled 2017-05-16: qty 1

## 2017-05-16 MED ORDER — HYDRALAZINE HCL 20 MG/ML IJ SOLN
10.0000 mg | INTRAMUSCULAR | Status: DC | PRN
  Administered 2017-05-17: 10 mg via INTRAVENOUS
  Filled 2017-05-16 (×2): qty 1

## 2017-05-16 MED ORDER — ACETAMINOPHEN 650 MG RE SUPP: 650.0000 mg | Freq: Four times a day (QID) | RECTAL | Status: DC | PRN

## 2017-05-16 MED ORDER — LEVOTHYROXINE SODIUM 50 MCG PO TABS
125.0000 ug | ORAL_TABLET | Freq: Every day | ORAL | Status: DC
  Administered 2017-05-16 – 2017-05-17 (×2): 125 ug via ORAL
  Filled 2017-05-16 (×2): qty 1
  Filled 2017-05-16: qty 0.5

## 2017-05-16 MED ORDER — GUAIFENESIN-DM 100-10 MG/5ML PO SYRP
5.0000 mL | ORAL_SOLUTION | ORAL | Status: DC | PRN
  Administered 2017-05-16 – 2017-05-17 (×3): 5 mL via ORAL
  Filled 2017-05-16 (×3): qty 5

## 2017-05-16 MED ORDER — ONDANSETRON HCL 4 MG/2ML IJ SOLN
4.0000 mg | Freq: Four times a day (QID) | INTRAMUSCULAR | Status: DC | PRN
  Administered 2017-05-16 – 2017-05-19 (×4): 4 mg via INTRAVENOUS
  Filled 2017-05-16 (×5): qty 2

## 2017-05-16 MED ORDER — ACETAMINOPHEN 325 MG PO TABS
650.0000 mg | ORAL_TABLET | Freq: Four times a day (QID) | ORAL | Status: DC | PRN
  Administered 2017-05-17 – 2017-05-18 (×3): 650 mg via ORAL
  Filled 2017-05-16 (×3): qty 2

## 2017-05-16 MED ORDER — HYDRALAZINE HCL 20 MG/ML IJ SOLN: 10.0000 mg | Freq: Four times a day (QID) | INTRAMUSCULAR | Status: DC | PRN

## 2017-05-16 MED ORDER — INSULIN ASPART 100 UNIT/ML ~~LOC~~ SOLN
0.0000 [IU] | Freq: Four times a day (QID) | SUBCUTANEOUS | Status: DC
  Administered 2017-05-16 (×2): 2 [IU] via SUBCUTANEOUS
  Administered 2017-05-17: 3 [IU] via SUBCUTANEOUS
  Administered 2017-05-17 (×2): 1 [IU] via SUBCUTANEOUS
  Administered 2017-05-17: 3 [IU] via SUBCUTANEOUS
  Administered 2017-05-17: 2 [IU] via SUBCUTANEOUS
  Administered 2017-05-18 (×2): 1 [IU] via SUBCUTANEOUS
  Filled 2017-05-16 (×10): qty 1

## 2017-05-16 MED ORDER — SODIUM CHLORIDE 0.9 % IV SOLN
500.0000 mg | INTRAVENOUS | Status: DC
  Administered 2017-05-16 – 2017-05-17 (×2): 500 mg via INTRAVENOUS
  Filled 2017-05-16 (×4): qty 500

## 2017-05-16 MED ORDER — BENZONATATE 100 MG PO CAPS
200.0000 mg | ORAL_CAPSULE | Freq: Three times a day (TID) | ORAL | Status: DC | PRN
  Administered 2017-05-16 – 2017-05-17 (×2): 200 mg via ORAL
  Filled 2017-05-16 (×3): qty 2

## 2017-05-16 MED ORDER — SIMVASTATIN 40 MG PO TABS
40.0000 mg | ORAL_TABLET | Freq: Every day | ORAL | Status: DC
  Administered 2017-05-16 – 2017-05-17 (×2): 40 mg via ORAL
  Filled 2017-05-16 (×2): qty 1

## 2017-05-16 MED ORDER — IPRATROPIUM-ALBUTEROL 0.5-2.5 (3) MG/3ML IN SOLN: 3.0000 mL | RESPIRATORY_TRACT | Status: DC | PRN

## 2017-05-16 MED ORDER — INSULIN ASPART 100 UNIT/ML ~~LOC~~ SOLN
0.0000 [IU] | Freq: Four times a day (QID) | SUBCUTANEOUS | Status: DC
  Administered 2017-05-16: 1 [IU] via SUBCUTANEOUS
  Filled 2017-05-16: qty 1

## 2017-05-16 NOTE — Consult Note (Signed)
Cardiology Consultation:   Patient ID: Amy [name]; [id number]; 01-Oct-1927   Admit date: 05/15/2017 Date of Consult: 05/16/2017  Primary Care Provider: Leonel Ramsay, MD Primary Cardiologist: Ida Rogue, MD  Primary Electrophysiologist:  none   Patient Profile:   Amy [name] is a 84 y.o. female with a hx of coronary artery disease, hypertension, hyperlipidemia, chronic diastolic heart failure, CKD stage III who is being seen today for the evaluation of GI bleeding and the need for Plavix at the request of [facility].  History of Present Illness:   Amy [name] is an 84 year old female who presented to the hospital with progressive shortness of breath and weakness.  She had been having shortness of breath for the past week or so.  She had been having cough and hemoptysis.  She is also been having melanotic stools.  In the emergency room, she was found to have pneumonia on imaging and met sepsis criteria.  She was seen by gastroenterology who felt that her melena was possibly due to peptic ulcer disease.  There is a plan for EGD, but Plavix needs to be held prior to evaluation.  She is not on an aspirin.  She was recommended to have Protonix twice a day.  Past Medical History:  Diagnosis Date  . Bladder incontinence   . CAD (coronary artery disease)    a. 05/2009 Cath: LAD 90p (Xience 2.75 x 12 mm DES), 18m D1 40, LCx 40p/m, RCA 30/40/30p/m, RCA 30/25d;  b.  12/2011 Lexiscan MV: no ischemia, breast attenuation artifact, normal EF-->Low risk; c. 10/2016 MV: fixed apical defect, most likely apical thinning and attenuation, No ischemia, EF 60%.  . [facility]    [facility]  . Carotid arterial disease w/ R Carotid Bruit (HCC)    a. 08/2016 Carotid U/S: 40-59% bilat ICA stenosis - f/u 1 yr.  . Chronic diastolic [place]congestive[place] heart [place]    a. Echo 09/2015: EF 55-60% w/ Grade 1 DD, sev Ca2+ MV annulus, mildly dil LA; b. 09/2016 Echo: EF 65-70%, Gr2 DD, mildly dil  LA/RA, nl RV fxn.  . Chronic Dyspnea on exertion   . CKD (chronic kidney disease) stage 3, GFR 30-59 ml/min (HCC)   . Degenerative arthritis of knee    bilateral knees  . Diabetes mellitus    Type II  . GIB (gastrointestinal bleeding)    a. 08/2016 Admission w/ presyncope/anemia/melena-->req Transfusion-->endo ok, colonoscopy w/ polyps but no source of bleeding (most likely diverticular).  . Hiatal hernia   . Hypertension   . Iron deficiency   . Menopausal symptoms   . Morbid [place] ([place]   . Renal insufficiency   . Thyroid disease    hypothyroidism    Past Surgical History:  Procedure Laterality Date  . COLONOSCOPY  2015  . COLONOSCOPY WITH PROPOFOL N/A 09/25/2016   Procedure: COLONOSCOPY WITH PROPOFOL;  Surgeon: AJonathon Bellows MD;  [facility];  Service: Gastroenterology;  Laterality: N/A;  . COLONOSCOPY WITH PROPOFOL N/A 09/26/2016   Procedure: COLONOSCOPY WITH PROPOFOL;  Surgeon: AJonathon Bellows MD;  [facility];  Service: Gastroenterology;  Laterality: N/A;  . ESOPHAGOGASTRODUODENOSCOPY (EGD) WITH PROPOFOL N/A 09/25/2016   Procedure: ESOPHAGOGASTRODUODENOSCOPY (EGD) WITH PROPOFOL;  Surgeon: AJonathon Bellows MD;  [facility];  Service: Gastroenterology;  Laterality: N/A;  . REPLACEMENT TOTAL KNEE BILATERAL    . TOTAL VAGINAL HYSTERECTOMY     ovarian mass, not cancerous  . UPPER GI ENDOSCOPY  2015     Home Medications:  Prior to  Admission medications   Medication Sig Start Date End Date Taking? Authorizing Provider  albuterol (PROVENTIL HFA;VENTOLIN HFA) 108 (90 Base) MCG/ACT inhaler Inhale 2 puffs into the lungs every 6 (six) hours as needed for wheezing or shortness of breath.   Yes [provider]  amLODipine (NORVASC) 10 MG tablet Take 10 mg by mouth daily.   Yes [provider]  aspirin EC 81 MG tablet Take 81 mg by mouth daily.   Yes [provider]  cloNIDine (CATAPRES - DOSED IN MG/24 HR) 0.1 mg/24hr patch Place 0.2  mg onto the skin once a week. Patient changes on Friday   Yes [provider]  clopidogrel (PLAVIX) 75 MG tablet Take 1 tablet (75 mg total) by mouth daily. 09/27/16  Yes Dustin Flock, MD  docusate sodium (COLACE) 100 MG capsule Take 100 mg by mouth daily as needed for mild constipation.   Yes [provider]  doxazosin (CARDURA) 8 MG tablet TAKE ONE TABLET TWICE DAILY 03/24/17  Yes Gollan, Kathlene November, MD  furosemide (LASIX) 20 MG tablet Take 1 tablet (20 mg total) by mouth daily. 10/22/16  Yes Theora Gianotti, NP  hydrALAZINE (APRESOLINE) 25 MG tablet Take 1 tablet (25 mg) by mouth two times a day. Patient taking differently: Take 25 mg by mouth. For systolic BP 811 11/07/76  Yes Gollan, Kathlene November, MD  ipratropium-albuterol (DUONEB) 0.5-2.5 (3) MG/3ML SOLN Take 3 mLs by nebulization every 4 (four) hours as needed (shortness of breath/wheezing.). 10/11/15  Yes Sainani, Belia Heman, MD  levofloxacin (LEVAQUIN) 500 MG tablet Take 500 mg by mouth daily. 05/14/17 05/21/17 Yes [provider]  levothyroxine (SYNTHROID, LEVOTHROID) 125 MCG tablet Take 125 mcg by mouth daily.   Yes [provider]  metFORMIN (GLUCOPHAGE) 1000 MG tablet Take 1,000 mg by mouth 2 (two) times daily with a meal.   Yes [provider]  Multiple Vitamin (MULTIVITAMIN) tablet Take 1 tablet by mouth daily.   Yes [provider]  Multiple Vitamins-Minerals (PRESERVISION AREDS 2 PO) Take 1 capsule by mouth daily.   Yes [provider]  ondansetron (ZOFRAN-ODT) 4 MG disintegrating tablet Take 4 mg by mouth every 8 (eight) hours as needed for nausea.  05/14/17 05/21/17 Yes [provider]  oxybutynin (DITROPAN-XL) 5 MG 24 hr tablet Take 5 mg by mouth daily.    Yes [provider]  predniSONE (DELTASONE) 20 MG tablet Take 20-40 mg by mouth daily. 40 mg for 4 days, then 20 mg for 4 days 05/14/17  Yes [provider]  senna (SENOKOT) 8.6 MG TABS tablet  Take 1 tablet (8.6 mg total) by mouth daily as needed for mild constipation. 10/11/15  Yes Henreitta Leber, MD  simvastatin (ZOCOR) 40 MG tablet Take 1 tablet (40 mg total) by mouth at bedtime. 08/22/16  Yes Minna Merritts, MD  SYMBICORT 160-4.5 MCG/ACT inhaler Inhale 2 puffs into the lungs 2 (two) times daily.  11/15/14  Yes [provider]    Inpatient Medications: Scheduled Meds: . cloNIDine  0.2 mg Transdermal Weekly  . [START ON 05/17/2017] doxazosin  8 mg Oral BID  . insulin aspart  0-9 Units Subcutaneous Q6H  . levothyroxine  125 mcg Oral QAC breakfast  . mometasone-formoterol  2 puff Inhalation BID  . [START ON 05/19/2017] pantoprazole  40 mg Intravenous Q12H  . simvastatin  40 mg Oral QHS   Continuous Infusions: . azithromycin    . cefTRIAXone (ROCEPHIN) IVPB 1 gram/100 mL NS (Mini-Bag Plus)    .  pantoprozole (PROTONIX) infusion 8 mg/hr (05/16/17 1240)   PRN Meds: acetaminophen **OR** acetaminophen, benzonatate, guaiFENesin-dextromethorphan, hydrALAZINE, hydrALAZINE, ipratropium-albuterol, ondansetron **OR** ondansetron (ZOFRAN) IV  Allergies:    Allergies  Allergen Reactions  . Losartan Other (See Comments)    Hyperkalemia  . Morphine And Related Shortness Of Breath    Rash, difficulty breathing, nausea.   . Atenolol Other (See Comments)    Other reaction(s): Other (See Comments) Decreased heart rate Decreased heart rate  . Codeine     Rash, difficulty breathing, nausea.  . Nsaids Other (See Comments)    Other reaction(s): Unknown  . Rofecoxib     Other reaction(s): Unknown  . Sucralfate Other (See Comments)    Throat tightness  . Sulfa Antibiotics     Other reaction(s): Unknown    Social History:   Social History   Socioeconomic History  . Marital status: Widowed    Spouse name: Not on file  . Number of children: Not on file  . Years of education: Not on file  . Highest education level: Not on file  Occupational History  . Not on file    Social Needs  . Financial resource strain: Not on file  . Food insecurity:    Worry: Not on file    Inability: Not on file  . Transportation needs:    Medical: Not on file    Non-medical: Not on file  Tobacco Use  . Smoking status: Never Smoker  . Smokeless tobacco: Never Used  Substance and Sexual Activity  . Alcohol use: No  . Drug use: No  . Sexual activity: Not on file  Lifestyle  . Physical activity:    Days per week: Not on file    Minutes per session: Not on file  . Stress: Not on file  Relationships  . Social connections:    Talks on phone: Not on file    Gets together: Not on file    Attends religious service: Not on file    Active member of club or organization: Not on file    Attends meetings of clubs or organizations: Not on file    Relationship status: Not on file  . Intimate partner violence:    Fear of current or ex partner: Not on file    Emotionally abused: Not on file    Physically abused: Not on file    Forced sexual activity: Not on file  Other Topics Concern  . Not on file  Social History Narrative  . Not on file    Family History:    Family History  Problem Relation Age of Onset  . Breast cancer Mother 31     ROS:  Please see the history of present illness.  All other ROS reviewed and negative.     Physical Exam/Data:   Vitals:   05/16/17 0147 05/16/17 0153 05/16/17 0231 05/16/17 1210  BP: (!) 163/40  (!) 188/45 (!) 171/51  Pulse: (!) 51 (!) 54 (!) 58 62  Resp: (!) 22 19 (!) 24 18  Temp:   97.9 F (36.6 C) 98 F (36.7 C)  TempSrc:   Oral Oral  SpO2: 95% 95% 98% 94%  Weight:   203 lb 8 oz (92.3 kg)   Height:   _0  (1.575 m)     Intake/Output Summary (Last 24 hours) at 05/16/2017 1435 Last data filed at 05/16/2017 1410 Gross per 24 hour  Intake 1167.8 ml  Output 600 ml  Net 567.8 ml   [employer]  05/15/17 1531 05/16/17 0231  Weight: 205 lb (93 kg) 203 lb 8 oz (92.3 kg)   Body mass index is 37.22 kg/m.  General:   Well nourished, well developed, in no acute distress HEENT: normal Lymph: no adenopathy Neck: no JVD Endocrine:  No thryomegaly Vascular: No carotid bruits; FA pulses 2+ bilaterally without bruits  Cardiac:  normal S1, S2; RRR; no murmur  Lungs:  clear to auscultation bilaterally, no wheezing, rhonchi or rales  Abd: soft, nontender, no hepatomegaly  Ext: no edema Musculoskeletal:  No deformities, BUE and BLE strength normal and equal Skin: warm and dry  Neuro:  CNs 2-12 intact, no focal abnormalities noted Psych:  Normal affect   EKG:  The EKG was personally reviewed and demonstrates: Sinus rhythm, right bundle branch block Telemetry:  Telemetry was personally reviewed and demonstrates:  Sinus rhythm, RBBB  Relevant CV Studies: TTE 10/22/16 - Left ventricle: The cavity size was normal. Wall thickness was   increased in a pattern of moderate LVH. Systolic function was   vigorous. The estimated ejection fraction was in the range of 65%   to 70%. Features are consistent with a pseudonormal left   ventricular filling pattern, with concomitant abnormal relaxation   and increased filling pressure (grade 2 diastolic dysfunction).   Doppler parameters are consistent with high ventricular filling   pressure. - Mitral valve: Calcified annulus. - Left atrium: The atrium was mildly dilated. - Right ventricle: The cavity size was mildly dilated. Systolic   function was normal. - Right atrium: The atrium was mildly dilated. - Pericardium, extracardiac: A small pericardial effusion was   identified.  Laboratory Data:  Chemistry Recent Labs  Lab 05/15/17 1550 05/16/17 0738  NA 139 143  K 5.6* 4.5  CL 109 113*  CO2 19* 23  GLUCOSE 284* 127*  BUN 38* 36*  CREATININE 1.56* 1.45*  CALCIUM 8.4* 7.8*  GFRNONAA 29* 31*  GFRAA 33* 36*  ANIONGAP 11 7    Recent Labs  Lab 05/15/17 2134  PROT 5.9*  ALBUMIN 2.9*  AST 26  ALT 11*  ALKPHOS 70  BILITOT 0.5   Hematology Recent Labs    Lab 05/15/17 1550 05/16/17 0738  WBC 6.9 7.8  RBC 2.79* 2.63*  2.59*  HGB 8.4* 7.8*  HCT 25.5* 23.6*  MCV 91.2 89.6  MCH 30.2 29.6  MCHC 33.1 33.0  RDW 15.0* 14.7*  PLT 228 225   Cardiac Enzymes Recent Labs  Lab 05/15/17 1550  TROPONINI <0.03   No results for input(s): TROPIPOC in the last 168 hours.  BNPNo results for input(s): BNP, PROBNP in the last 168 hours.  DDimer No results for input(s): DDIMER in the last 168 hours.  Radiology/Studies:  Dg Chest 2 View  Result Date: 05/15/2017 CLINICAL DATA:  Pneumonia EXAM: CHEST - 2 VIEW COMPARISON:  05/14/2017 FINDINGS: Patchy airspace disease has not developed throughout both lungs. It has a nodular appearance. This has progressed since the prior study. Cardiomegaly. No pneumothorax. No pleural effusion. IMPRESSION: Airspace disease has worsened. There is now patchy airspace disease throughout both lungs. Electronically Signed   By: Marybelle Killings M.D.   On: 05/15/2017 16:19    Assessment and Plan:   1. Coronary artery disease: Status post LAD stent in 2011.  Myoview in 2013 is without ischemia.  She is currently on Plavix as her only antiplatelet.  She did present to the hospital with melena.  At this point, holding Plavix so such that EGD could be performed would  be reasonable.  She had stenting in 2011 but has had no evidence of ischemia since that time.  Ideally she would be able to restart Plavix post EGD. 2. Pneumonia with sepsis of pulmonary origin: Ceftriaxone and azithromycin per primary team 3. Melena: Workup per GI 4. Chronic diastolic heart failure:no current signs of volume overload. Continue current meds 5. Essential hypertension: Blood pressure currently elevated.  Naela Nodal likely need resumption of her home blood pressure medications. 6. Paroxysmal atrial fibrillation: Remains in sinus rhythm today.  No changes.   For questions or updates, please contact [place] Please consult [email or web address] for contact info  under Cardiology/STEMI.   Signed, Jackelyn Illingworth Meredith Leeds, MD  05/16/2017 2:35 PM

## 2017-05-16 NOTE — Consult Note (Addendum)
Vonda Antigua, MD [address] [place], [place], [place], [id number] [place], [place], [place], [address] Phone: [phone]  Fax: [phone]  Consultation  Referring Provider:     Dr. Benjie Karvonen Primary Care Physician:  Leonel Ramsay, MD Primary Gastroenterologist:   Dr. Vira Agar   Reason for Consultation:   Melena  Date of Admission:  05/15/2017 Date of Consultation:  05/16/2017         HPI:   Amy [name] is a 84 y.o. female admitted with sepsis due to pneumonia, presented with shortness of breath.  GI consulted for complaints of melena over the last week.  Patient reports hemoptysis, 10 times a day that started 7-10 days ago.  Patient reports that she has seen black stools multiple times a week, over the last 6 months.  She states she has noticed this since being on IV iron.  Patient also reports right upper quadrant pain, intermittent, cramping, unrelated to meals, ongoing for 3-4 days, with no previous history, no radiation.  Not on any NSAIDs except aspirin daily.  Is also on Plavix at home for previous history of CAD.  Patient had a EGD and colonoscopy in August 2018 by Dr. Vicente Males for melena.  EGD was normal except for a hiatal hernia.  Colonoscopy showed diverticulosis,  nonbleeding internal hemorrhoids, 4 polyps were removed and showed tubular adenoma and 2 of the polyps. As per [facility] clinic GI no fromte July 2018 "Colonoscopy 06/30/13: 4 tubular adenomas, diverticulosis, internal hemorrhoids EGD 06/30/13: moderate hiatal hernia, gastritis  Capsule endoscopy: normal per documented phone call to the patient"    Past Medical History:  Diagnosis Date  . Bladder incontinence   . CAD (coronary artery disease)    a. 05/2009 Cath: LAD 90p (Xience 2.75 x 12 mm DES), 43m, D1 40, LCx 40p/m, RCA 30/40/30p/m, RCA 30/25d;  b.  12/2011 Lexiscan MV: no ischemia, breast attenuation artifact, normal EF-->Low risk; c. 10/2016 MV: fixed apical defect, most likely  apical thinning and attenuation, No ischemia, EF 60%.  . Cancer ([place])    ovarian  . Carotid arterial disease w/ R Carotid Bruit (HCC)    a. 08/2016 Carotid U/S: 40-59% bilat ICA stenosis - f/u 1 yr.  . Chronic diastolic (congestive) heart failure ([place])    a. Echo 09/2015: EF 55-60% w/ Grade 1 DD, sev Ca2+ MV annulus, mildly dil LA; b. 09/2016 Echo: EF 65-70%, Gr2 DD, mildly dil LA/RA, nl RV fxn.  . Chronic Dyspnea on exertion   . CKD (chronic kidney disease) stage 3, GFR 30-59 ml/min (HCC)   . Degenerative arthritis of knee    bilateral knees  . Diabetes mellitus    Type II  . GIB (gastrointestinal bleeding)    a. 08/2016 Admission w/ presyncope/anemia/melena-->req Transfusion-->endo ok, colonoscopy w/ polyps but no source of bleeding (most likely diverticular).  . Hiatal hernia   . Hypertension   . Iron deficiency   . Menopausal symptoms   . Morbid obesity ([place])   . Renal insufficiency   . Thyroid disease    hypothyroidism    Past Surgical History:  Procedure Laterality Date  . COLONOSCOPY  2015  . COLONOSCOPY WITH PROPOFOL N/A 09/25/2016   Procedure: COLONOSCOPY WITH PROPOFOL;  Surgeon: [name], MD;  Location: [facility] ENDOSCOPY;  Service: Gastroenterology;  Laterality: N/A;  . COLONOSCOPY WITH PROPOFOL N/A 09/26/2016   Procedure: COLONOSCOPY WITH PROPOFOL;  Surgeon: [name], MD;  Location: [facility] ENDOSCOPY;  Service: Gastroenterology;  Laterality: N/A;  .  ESOPHAGOGASTRODUODENOSCOPY (EGD) WITH PROPOFOL N/A 09/25/2016   Procedure: ESOPHAGOGASTRODUODENOSCOPY (EGD) WITH PROPOFOL;  Surgeon: [name], MD;  Location: [facility] ENDOSCOPY;  Service: Gastroenterology;  Laterality: N/A;  . REPLACEMENT TOTAL KNEE BILATERAL    . TOTAL VAGINAL HYSTERECTOMY     ovarian mass, not cancerous  . UPPER GI ENDOSCOPY  2015    Prior to Admission medications   Medication Sig Start Date End Date Taking? Authorizing Provider  albuterol (PROVENTIL HFA;VENTOLIN HFA) 108 (90 Base) MCG/ACT inhaler Inhale 2  puffs into the lungs every 6 (six) hours as needed for wheezing or shortness of breath.   Yes [provider]  amLODipine (NORVASC) 10 MG tablet Take 10 mg by mouth daily.   Yes [provider]  aspirin EC 81 MG tablet Take 81 mg by mouth daily.   Yes [provider]  cloNIDine (CATAPRES - DOSED IN MG/24 HR) 0.1 mg/24hr patch Place 0.2 mg onto the skin once a week. Patient changes on Friday   Yes [provider]  clopidogrel (PLAVIX) 75 MG tablet Take 1 tablet (75 mg total) by mouth daily. 09/27/16  Yes Dustin Flock, MD  doxazosin (CARDURA) 8 MG tablet TAKE ONE TABLET TWICE DAILY 03/24/17  Yes Gollan, Kathlene November, MD  furosemide (LASIX) 20 MG tablet Take 1 tablet (20 mg total) by mouth daily. 10/22/16  Yes Theora Gianotti, NP  furosemide (LASIX) 20 MG tablet Take 20 mg by mouth daily.    Yes [provider]  levofloxacin (LEVAQUIN) 500 MG tablet Take 500 mg by mouth daily. 05/14/17 05/21/17 Yes [provider]  levothyroxine (SYNTHROID, LEVOTHROID) 125 MCG tablet Take 125 mcg by mouth daily.   Yes [provider]  metFORMIN (GLUCOPHAGE) 1000 MG tablet Take 1,000 mg by mouth 2 (two) times daily with a meal.   Yes [provider]  Multiple Vitamins-Minerals (PRESERVISION AREDS 2 PO) Take 1 capsule by mouth daily.   Yes [provider]  ondansetron (ZOFRAN-ODT) 4 MG disintegrating tablet Take 4 mg by mouth every 8 (eight) hours as needed for nausea.  05/14/17 05/21/17 Yes [provider]  oxybutynin (DITROPAN-XL) 5 MG 24 hr tablet Take 5 mg by mouth daily.    Yes [provider]  predniSONE (DELTASONE) 20 MG tablet Take 20-40 mg by mouth daily. 40 mg for 4 days, then 20 mg for 4 days 05/14/17  Yes [provider]  simvastatin (ZOCOR) 40 MG tablet Take 1 tablet (40 mg total) by mouth at bedtime. 08/22/16  Yes Minna Merritts, MD  SYMBICORT 160-4.5 MCG/ACT inhaler Inhale 2 puffs into the lungs 2  (two) times daily.  11/15/14  Yes [provider]  docusate sodium (COLACE) 100 MG capsule Take 100 mg by mouth daily as needed for mild constipation.    [provider]  glimepiride (AMARYL) 1 MG tablet Take 1 mg by mouth daily with breakfast.  11/19/16   [provider]  hydrALAZINE (APRESOLINE) 25 MG tablet Take 1 tablet (25 mg) by mouth two times a day. 03/24/17   Minna Merritts, MD  ipratropium-albuterol (DUONEB) 0.5-2.5 (3) MG/3ML SOLN Take 3 mLs by nebulization every 4 (four) hours as needed (shortness of breath/wheezing.). 10/11/15   Henreitta Leber, MD  Multiple Vitamin (MULTIVITAMIN) tablet Take 1 tablet by mouth daily.    [provider]  senna (SENOKOT) 8.6 MG TABS tablet Take 1 tablet (8.6 mg total) by mouth daily as needed for mild constipation. 10/11/15   Henreitta Leber, MD  Family History  Problem Relation Age of Onset  . Breast cancer Mother 72     Social History   Tobacco Use  . Smoking status: Never Smoker  . Smokeless tobacco: Never Used  Substance Use Topics  . Alcohol use: No  . Drug use: No    Allergies as of 05/15/2017 - Review Complete 05/15/2017  Allergen Reaction Noted  . Losartan Other (See Comments) 02/11/2017  . Morphine and related Shortness Of Breath 06/24/2010  . Atenolol Other (See Comments) 07/11/2013  . Codeine  06/24/2010  . Nsaids Other (See Comments) 07/11/2013  . Rofecoxib    . Sucralfate Other (See Comments) 07/11/2013  . Sulfa antibiotics  07/11/2013    Review of Systems:    All systems reviewed and negative except where noted in HPI.   Physical Exam:  Vital signs in last 24 hours: Vitals:   05/16/17 0139 05/16/17 0147 05/16/17 0153 05/16/17 0231  BP:  (!) 163/40  (!) 188/45  Pulse: (!) 57 (!) 51 (!) 54 (!) 58  Resp: (!) 21 (!) 22 19 (!) 24  Temp:    97.9 F (36.6 C)  TempSrc:    Oral  SpO2: 94% 95% 95% 98%  Weight:    203 lb 8 oz (92.3 kg)  Height:    5\' 2"  (1.575 m)   Last BM Date:  05/15/17 General:   Pleasant, cooperative in NAD Head:  Normocephalic and atraumatic. Eyes:   No icterus.   Conjunctiva pink. PERRLA. Ears:  Normal auditory acuity. Neck:  Supple; no masses or thyroidomegaly Lungs: Respirations even and unlabored. Lungs clear to auscultation bilaterally.   No wheezes, crackles, or rhonchi.  Heart:  Regular rate and rhythm;  Without murmur, clicks, rubs or gallops Abdomen:  Soft, nondistended, nontender. Normal bowel sounds. No appreciable masses or hepatomegaly.  No rebound or guarding.  Neurologic:  Alert and oriented x3;  grossly normal neurologically. Skin:  Intact without significant lesions or rashes. Cervical Nodes:  No significant cervical adenopathy. Psych:  Alert and cooperative. Normal affect.  LAB RESULTS: Recent Labs    05/15/17 1550  WBC 6.9  HGB 8.4*  HCT 25.5*  PLT 228   BMET Recent Labs    05/15/17 1550  NA 139  K 5.6*  CL 109  CO2 19*  GLUCOSE 284*  BUN 38*  CREATININE 1.56*  CALCIUM 8.4*   LFT Recent Labs    05/15/17 2134  PROT 5.9*  ALBUMIN 2.9*  AST 26  ALT 11*  ALKPHOS 70  BILITOT 0.5  BILIDIR <0.1*  IBILI NOT CALCULATED   PT/INR No results for input(s): LABPROT, INR in the last 72 hours.  STUDIES: Dg Chest 2 View  Result Date: 05/15/2017 CLINICAL DATA:  Pneumonia EXAM: CHEST - 2 VIEW COMPARISON:  05/14/2017 FINDINGS: Patchy airspace disease has not developed throughout both lungs. It has a nodular appearance. This has progressed since the prior study. Cardiomegaly. No pneumothorax. No pleural effusion. IMPRESSION: Airspace disease has worsened. There is now patchy airspace disease throughout both lungs. Electronically Signed   By: Marybelle Killings M.D.   On: 05/15/2017 16:19      Impression / Plan:   Amy [name] is a 84 y.o. y/o female with melena at home, and use of aspirin and Plavix at home for history of CAD, presented with shortness of breath and admitted for sepsis from pneumonia, also  reported hemoptysis, and melena at home  Hemoptysis possibly related to patient's pneumonia Consider pulmonology consult for this, especially  if this is not resolved  Melena possibly related to peptic ulcer disease from aspirin use. Patient states melena has been ongoing for over 6 months.  EGD is indicated for evaluation, however, patient took Plavix yesterday.  Plavix will need to be held for 3-5 days prior to the procedure.  As per guidelines, endoscopy requiring therapeutic intervention for bleeding is considered a high risk procedure, and anticoagulation is recommended to be held prior to the procedure. No active signs of GI bleeding present, at this time.  Patient is hemodynamically stable, and hemoglobin has been stable since presentation yesterday   No indication for emergent EGD at this time If Hgb drops or pt requires transfusions would recommend clear liquid diet instead of full diet. Solid diet ok if no signs of bleeding and no drop in Hgb.   Would recommend Protonix 40 mg IV twice daily Continue serial CBCs and transfuse. Avoid NSAIDs Last cardiology note from Dr. Rockey Situ, states that patient is not on anticoagulation?    Would recommend clarifying if patient needs to be continued on Plavix long-term From her cardiologist.   If patient has active GI bleeding prior   To the endoscopy, would consider RBC scan to evaluate source of bleeding, and consult vascular if positive.  Thank you for involving me in the care of this patient.      LOS: 0 days   Virgel Manifold, MD  05/16/2017, 8:01 AM

## 2017-05-16 NOTE — Progress Notes (Signed)
Patient admitted this morning with sepsis due to pneumonia.  She is doing quite well this morning.  She is not requiring oxygen.  Patient evaluated by GI for dark colored stools/hemoptysis.  Continue antibiotics as per admitting MD Cardiology consultation for Plavix Continue to monitor CBC

## 2017-05-16 NOTE — ED Notes (Signed)
Adm. Dr in rm 

## 2017-05-16 NOTE — Evaluation (Signed)
Physical Therapy Evaluation Patient Details Name: Amy [name] MRN: [id number] DOB: Jul 04, 1927 Today's Date: 05/16/2017   History of Present Illness  presented to ER secondary to SOB, melena; admitted with sepsis related to PNA.  Clinical Impression  Upon evaluation, patient alert and oriented; follows all commands and demonstrates good insight/safety awareness.  Bilat UE/LE strength and ROM grossly symmetrical and WFL.  Able to complete bed mobility with mod indep; sit/stand, basic transfers and gait (60') with RW, cga/min assist.  Mild forward trunk flexion with mod reliance on RW. Notably SOB with exertion, BORG 6/10 after gait trial; sats WFL on RA.  Do recommend continued use of RW for energy conservation at this time; patient in agreement. Would benefit from skilled PT to address above deficits and promote optimal return to PLOF; Recommend transition to [place] upon discharge from acute hospitalization.     Follow Up Recommendations Home health PT(with return to [facility] as appropriate)    Equipment Recommendations  Rolling walker with 5" wheels    Recommendations for Other Services       Precautions / Restrictions Precautions Precautions: Fall Restrictions Weight Bearing Restrictions: No      Mobility  Bed Mobility Overal bed mobility: Modified Independent                Transfers Overall transfer level: Needs assistance Equipment used: Rolling walker (2 wheeled) Transfers: Sit to/from Stand Sit to Stand: Min guard            Ambulation/Gait Ambulation/Gait assistance: Min guard Ambulation Distance (Feet): 60 Feet Assistive device: Rolling walker (2 wheeled)       General Gait Details: reciprocal stepping pattern with decreased ankle strategy noted bilat (R > L); forward flexed posture with mod reliance on RW.  Mod SOB with exertion, BORG 6/10, requiring 3-4 min seated rest for recovery to baseline.  Stairs            Wheelchair  Mobility    Modified Rankin (Stroke Patients Only)       Balance Overall balance assessment: Needs assistance Sitting-balance support: No upper extremity supported;Feet supported Sitting balance-Leahy Scale: Good     Standing balance support: Bilateral upper extremity supported Standing balance-Leahy Scale: Fair                               Pertinent Vitals/Pain Pain Assessment: No/denies pain    Home Living Family/patient expects to be discharged to:: Private residence Living Arrangements: Alone Available Help at Discharge: Family Type of Home: House Home Access: Stairs to enter Entrance Stairs-Rails: Psychiatric nurse of Steps: 5 Home Layout: One level Home Equipment: Cane - single point      Prior Function Level of Independence: Independent with assistive device(s)         Comments: Mod indep with ADLs, household and limited community distances with [facility]; actively participating with [employer] pulmonary rehab program     Hand Dominance        Extremity/Trunk Assessment   Upper Extremity Assessment Upper Extremity Assessment: Overall WFL for tasks assessed    Lower Extremity Assessment Lower Extremity Assessment: Overall WFL for tasks assessed(grossly at least 4/5 throughout)       Communication   Communication: No difficulties  Cognition Arousal/Alertness: Awake/alert Behavior During Therapy: WFL for tasks assessed/performed Overall Cognitive Status: Within Functional Limits for tasks assessed  General Comments      Exercises     Assessment/Plan    PT Assessment Patient needs continued PT services  PT Problem List Decreased activity tolerance;Decreased balance;Decreased mobility;Decreased knowledge of use of DME;Decreased safety awareness;Decreased knowledge of precautions       PT Treatment Interventions DME instruction;Gait training;Functional mobility  training;Therapeutic activities;Therapeutic exercise;Patient/family education;Balance training    PT Goals (Current goals can be found in the Care Plan section)  Acute Rehab PT Goals Patient Stated Goal: to get up a little PT Goal Formulation: With patient Time For Goal Achievement: 05/30/17 Potential to Achieve Goals: Good    Frequency Min 2X/week   Barriers to discharge Decreased caregiver support      Co-evaluation               AM-PAC PT "6 Clicks" Daily Activity  Outcome Measure Difficulty turning over in bed (including adjusting bedclothes, sheets and blankets)?: A Little Difficulty moving from lying on back to sitting on the side of the bed? : A Little Difficulty sitting down on and standing up from a chair with arms (e.g., wheelchair, bedside commode, etc,.)?: A Little Help needed moving to and from a bed to chair (including a wheelchair)?: A Little Help needed walking in hospital room?: A Little Help needed climbing 3-5 steps with a railing? : A Little 6 Click Score: 18    End of Session Equipment Utilized During Treatment: Gait belt Activity Tolerance: Patient tolerated treatment well Patient left: in chair;with chair alarm set;with family/visitor present Nurse Communication: Mobility status PT Visit Diagnosis: Difficulty in walking, not elsewhere classified (R26.2);Muscle weakness (generalized) (M62.81)    Time: 2831-5176 PT Time Calculation (min) (ACUTE ONLY): 16 min   Charges:   PT Evaluation $PT Eval Moderate Complexity: 1 Mod     PT G Codes:       Sandrina Heaton H. Owens Shark, PT, DPT, NCS 05/16/17, 3:56 PM [phone]

## 2017-05-16 NOTE — Progress Notes (Signed)
   05/16/17 1315  Clinical Encounter Type  Visited With Patient  Visit Type Initial (order regarding advanced directive)  Referral From Physician  Consult/Referral To [place] responded to OR for advanced directive.  Chaplain engaged patient regarding chaplain services; patient believes that she already has an advanced directive.  Patient would like prayer.  Patient wanted to pray for health for her and her family and for them to remain close to God.  Patient also spoke of her daughter's death 3 or 4 years ago and the ongoing impact of that loss.  Chaplain led prayer with patient's requests and included space regarding daughter's death.  Chaplain was paged to a code and ended this visit.  Encouraged patient to have chaplain paged as needed.

## 2017-05-16 NOTE — ED Notes (Signed)
Pharmacy called about protonix inf, states they will send it to ED

## 2017-05-16 NOTE — Progress Notes (Signed)
Pharmacy Antibiotic Note  Amy [name] is a 84 y.o. female admitted on 05/15/2017 with pneumonia.  Pharmacy has been consulted for ceftriaxone dosing.  Plan: Ceftriaxone 1 gram q 24 hours ordered.  Height: 5\' 2"  (157.5 cm) Weight: 205 lb (93 kg) IBW/kg (Calculated) : 50.1  Temp (24hrs), Avg:98.5 F (36.9 C), Min:98.5 F (36.9 C), Max:98.5 F (36.9 C)  Recent Labs  Lab 05/15/17 1550 05/15/17 2134 05/15/17 2357 05/16/17 0145  WBC 6.9  --   --   --   CREATININE 1.56*  --   --   --   LATICACIDVEN  --  2.3* 2.3* 1.5    Estimated Creatinine Clearance: 26.5 mL/min (A) (by C-G formula based on SCr of 1.56 mg/dL (H)).    Allergies  Allergen Reactions  . Losartan Other (See Comments)    Hyperkalemia  . Morphine And Related Shortness Of Breath    Rash, difficulty breathing, nausea.   . Atenolol Other (See Comments)    Other reaction(s): Other (See Comments) Decreased heart rate Decreased heart rate  . Codeine     Rash, difficulty breathing, nausea.  . Nsaids Other (See Comments)    Other reaction(s): Unknown  . Rofecoxib     Other reaction(s): Unknown  . Sucralfate Other (See Comments)    Throat tightness  . Sulfa Antibiotics     Other reaction(s): Unknown    Antimicrobials this admission: Azithromycin, ceftriaxone 3/22  >>    >>   Dose adjustments this admission:   Microbiology results: 3/22 BCx: pending      3/22 CXR: patchy airspace disease throughout both lungs Thank you for allowing pharmacy to be a part of this patient's care.  Laycee Fitzsimmons S 05/16/2017 2:29 AM

## 2017-05-16 NOTE — H&P (Signed)
[facility] NAME: Amy [name]    MR#:  [id number]  DATE OF BIRTH:  01-23-1928  DATE OF ADMISSION:  05/15/2017  PRIMARY CARE PHYSICIAN: Leonel Ramsay, MD   REQUESTING/REFERRING PHYSICIAN: Alfred Levins, MD  CHIEF COMPLAINT:   Chief Complaint  Patient presents with  . Pneumonia    HISTORY OF PRESENT ILLNESS:  Amy [name]  is a 84 y.o. female who presents with progressive shortness of breath and melena.  Patient states that she has been having increasing shortness of breath for the past week or so.  She has been having some cough, as well as some hemoptysis.  She also has had melanotic stools.  Here in the ED she was found to have pneumonia on imaging, and met sepsis criteria.  Hospitalist were called for admission  PAST MEDICAL HISTORY:   Past Medical History:  Diagnosis Date  . Bladder incontinence   . CAD (coronary artery disease)    a. 05/2009 Cath: LAD 90p (Xience 2.75 x 12 mm DES), 68m D1 40, LCx 40p/m, RCA 30/40/30p/m, RCA 30/25d;  b.  12/2011 Lexiscan MV: no ischemia, breast attenuation artifact, normal EF-->Low risk; c. 10/2016 MV: fixed apical defect, most likely apical thinning and attenuation, No ischemia, EF 60%.  . Cancer ([facility]    ovarian  . Carotid arterial disease w/ R Carotid Bruit (HCC)    a. 08/2016 Carotid U/S: 40-59% bilat ICA stenosis - f/u 1 yr.  . Chronic diastolic (congestive) heart failure ([facility]    a. Echo 09/2015: EF 55-60% w/ Grade 1 DD, sev Ca2+ MV annulus, mildly dil LA; b. 09/2016 Echo: EF 65-70%, Gr2 DD, mildly dil LA/RA, nl RV fxn.  . Chronic Dyspnea on exertion   . CKD (chronic kidney disease) stage 3, GFR 30-59 ml/min (HCC)   . Degenerative arthritis of knee    bilateral knees  . Diabetes mellitus    Type II  . GIB (gastrointestinal bleeding)    a. 08/2016 Admission w/ presyncope/anemia/melena-->req Transfusion-->endo ok, colonoscopy w/ polyps but no source of bleeding (most  likely diverticular).  . Hiatal hernia   . Hypertension   . Iron deficiency   . Menopausal symptoms   . Morbid obesity ([facility]   . Renal insufficiency   . Thyroid disease    hypothyroidism     PAST SURGICAL HISTORY:   Past Surgical History:  Procedure Laterality Date  . COLONOSCOPY  2015  . COLONOSCOPY WITH PROPOFOL N/A 09/25/2016   Procedure: COLONOSCOPY WITH PROPOFOL;  Surgeon: AJonathon Bellows MD;  [facility];  Service: Gastroenterology;  Laterality: N/A;  . COLONOSCOPY WITH PROPOFOL N/A 09/26/2016   Procedure: COLONOSCOPY WITH PROPOFOL;  Surgeon: AJonathon Bellows MD;  [facility];  Service: Gastroenterology;  Laterality: N/A;  . ESOPHAGOGASTRODUODENOSCOPY (EGD) WITH PROPOFOL N/A 09/25/2016   Procedure: ESOPHAGOGASTRODUODENOSCOPY (EGD) WITH PROPOFOL;  Surgeon: AJonathon Bellows MD;  [facility];  Service: Gastroenterology;  Laterality: N/A;  . REPLACEMENT TOTAL KNEE BILATERAL    . TOTAL VAGINAL HYSTERECTOMY     ovarian mass, not cancerous  . UPPER GI ENDOSCOPY  2015     SOCIAL HISTORY:   Social History   Tobacco Use  . Smoking status: Never Smoker  . Smokeless tobacco: Never Used  Substance Use Topics  . Alcohol use: No     FAMILY HISTORY:   Family History  Problem Relation Age of Onset  . Breast cancer Mother 869    DRUG ALLERGIES:  Allergies  Allergen Reactions  . Losartan Other (See Comments)    Hyperkalemia  . Morphine And Related Shortness Of Breath    Rash, difficulty breathing, nausea.   . Atenolol Other (See Comments)    Other reaction(s): Other (See Comments) Decreased heart rate Decreased heart rate  . Codeine     Rash, difficulty breathing, nausea.  . Nsaids Other (See Comments)    Other reaction(s): Unknown  . Rofecoxib     Other reaction(s): Unknown  . Sucralfate Other (See Comments)    Throat tightness  . Sulfa Antibiotics     Other reaction(s): Unknown    MEDICATIONS AT HOME:   Prior to Admission  medications   Medication Sig Start Date End Date Taking? Authorizing Provider  albuterol (PROVENTIL HFA;VENTOLIN HFA) 108 (90 Base) MCG/ACT inhaler Inhale 2 puffs into the lungs every 6 (six) hours as needed for wheezing or shortness of breath.   Yes [provider]  amLODipine (NORVASC) 10 MG tablet Take 10 mg by mouth daily.   Yes [provider]  aspirin EC 81 MG tablet Take 81 mg by mouth daily.   Yes [provider]  cloNIDine (CATAPRES - DOSED IN MG/24 HR) 0.1 mg/24hr patch Place 0.2 mg onto the skin once a week. Patient changes on Friday   Yes [provider]  clopidogrel (PLAVIX) 75 MG tablet Take 1 tablet (75 mg total) by mouth daily. 09/27/16  Yes Dustin Flock, MD  doxazosin (CARDURA) 8 MG tablet TAKE ONE TABLET TWICE DAILY 03/24/17  Yes Gollan, Kathlene November, MD  furosemide (LASIX) 20 MG tablet Take 1 tablet (20 mg total) by mouth daily. 10/22/16  Yes Theora Gianotti, NP  furosemide (LASIX) 20 MG tablet Take 20 mg by mouth daily.    Yes [provider]  levofloxacin (LEVAQUIN) 500 MG tablet Take 500 mg by mouth daily. 05/14/17 05/21/17 Yes [provider]  levothyroxine (SYNTHROID, LEVOTHROID) 125 MCG tablet Take 125 mcg by mouth daily.   Yes [provider]  metFORMIN (GLUCOPHAGE) 1000 MG tablet Take 1,000 mg by mouth 2 (two) times daily with a meal.   Yes [provider]  Multiple Vitamins-Minerals (PRESERVISION AREDS 2 PO) Take 1 capsule by mouth daily.   Yes [provider]  ondansetron (ZOFRAN-ODT) 4 MG disintegrating tablet Take 4 mg by mouth every 8 (eight) hours as needed for nausea.  05/14/17 05/21/17 Yes [provider]  oxybutynin (DITROPAN-XL) 5 MG 24 hr tablet Take 5 mg by mouth daily.    Yes [provider]  predniSONE (DELTASONE) 20 MG tablet Take 20-40 mg by mouth daily. 40 mg for 4 days, then 20 mg for 4 days 05/14/17  Yes [provider]  simvastatin (ZOCOR) 40 MG  tablet Take 1 tablet (40 mg total) by mouth at bedtime. 08/22/16  Yes Minna Merritts, MD  SYMBICORT 160-4.5 MCG/ACT inhaler Inhale 2 puffs into the lungs 2 (two) times daily.  11/15/14  Yes [provider]  docusate sodium (COLACE) 100 MG capsule Take 100 mg by mouth daily as needed for mild constipation.    [provider]  glimepiride (AMARYL) 1 MG tablet Take 1 mg by mouth daily with breakfast.  11/19/16   [provider]  hydrALAZINE (APRESOLINE) 25 MG tablet Take 1 tablet (25 mg) by mouth two times a day. 03/24/17   Minna Merritts, MD  ipratropium-albuterol (DUONEB) 0.5-2.5 (3) MG/3ML SOLN Take 3 mLs by nebulization every 4 (four) hours as needed (shortness  of breath/wheezing.). 10/11/15   Henreitta Leber, MD  Multiple Vitamin (MULTIVITAMIN) tablet Take 1 tablet by mouth daily.    [provider]  senna (SENOKOT) 8.6 MG TABS tablet Take 1 tablet (8.6 mg total) by mouth daily as needed for mild constipation. 10/11/15   Henreitta Leber, MD    REVIEW OF SYSTEMS:  Review of Systems  Constitutional: Negative for chills, fever, malaise/fatigue and weight loss.  HENT: Negative for ear pain, hearing loss and tinnitus.   Eyes: Negative for blurred vision, double vision, pain and redness.  Respiratory: Positive for cough, hemoptysis and shortness of breath.   Cardiovascular: Negative for chest pain, palpitations, orthopnea and leg swelling.  Gastrointestinal: Positive for melena. Negative for abdominal pain, constipation, diarrhea, nausea and vomiting.  Genitourinary: Negative for dysuria, frequency and hematuria.  Musculoskeletal: Negative for back pain, joint pain and neck pain.  Skin:       No acne, rash, or lesions  Neurological: Negative for dizziness, tremors, focal weakness and weakness.  Endo/Heme/Allergies: Negative for polydipsia. Does not bruise/bleed easily.  Psychiatric/Behavioral: Negative for depression. The patient is not nervous/anxious and  does not have insomnia.      VITAL SIGNS:   Vitals:   05/15/17 2330 05/15/17 2354 05/16/17 0003 05/16/17 0023  BP: (!) 139/44 (!) 146/57 (!) 154/41   Pulse: 63 61 (!) 59 69  Resp: 20 (!) 21 14 20   Temp:      TempSrc:      SpO2: 96% 97% 96% 97%  Weight:      Height:       Wt Readings from Last 3 Encounters:  05/15/17 93 kg (205 lb)  04/13/17 94.1 kg (207 lb 8 oz)  03/23/17 92.1 kg (203 lb)    PHYSICAL EXAMINATION:  Physical Exam  Vitals reviewed. Constitutional: She is oriented to person, place, and time. She appears well-developed and well-nourished. No distress.  HENT:  Head: Normocephalic and atraumatic.  Mouth/Throat: Oropharynx is clear and moist.  Eyes: Pupils are equal, round, and reactive to light. Conjunctivae and EOM are normal. No scleral icterus.  Neck: Normal range of motion. Neck supple. No JVD present. No thyromegaly present.  Cardiovascular: Normal rate, regular rhythm and intact distal pulses. Exam reveals no gallop and no friction rub.  No murmur heard. Respiratory: Effort normal. No respiratory distress. She has no wheezes. She has no rales.  Bilateral patchy rhonchi  GI: Soft. Bowel sounds are normal. She exhibits no distension. There is no tenderness.  Musculoskeletal: Normal range of motion. She exhibits no edema.  No arthritis, no gout  Lymphadenopathy:    She has no cervical adenopathy.  Neurological: She is alert and oriented to person, place, and time. No cranial nerve deficit.  No dysarthria, no aphasia  Skin: Skin is warm and dry. No rash noted. No erythema.  Psychiatric: She has a normal mood and affect. Her behavior is normal. Judgment and thought content normal.    LABORATORY PANEL:   CBC Recent Labs  Lab 05/15/17 1550  WBC 6.9  HGB 8.4*  HCT 25.5*  PLT 228   ------------------------------------------------------------------------------------------------------------------  Chemistries  Recent Labs  Lab 05/15/17 1550  05/15/17 2134  NA 139  --   K 5.6*  --   CL 109  --   CO2 19*  --   GLUCOSE 284*  --   BUN 38*  --   CREATININE 1.56*  --   CALCIUM 8.4*  --   AST  --  26  ALT  --  11*  ALKPHOS  --  70  BILITOT  --  0.5   ------------------------------------------------------------------------------------------------------------------  Cardiac Enzymes Recent Labs  Lab 05/15/17 1550  TROPONINI <0.03   ------------------------------------------------------------------------------------------------------------------  RADIOLOGY:  Dg Chest 2 View  Result Date: 05/15/2017 CLINICAL DATA:  Pneumonia EXAM: CHEST - 2 VIEW COMPARISON:  05/14/2017 FINDINGS: Patchy airspace disease has not developed throughout both lungs. It has a nodular appearance. This has progressed since the prior study. Cardiomegaly. No pneumothorax. No pleural effusion. IMPRESSION: Airspace disease has worsened. There is now patchy airspace disease throughout both lungs. Electronically Signed   By: Marybelle Killings M.D.   On: 05/15/2017 16:19    EKG:   Orders placed or performed during the hospital encounter of 05/15/17  . EKG 12-Lead  . EKG 12-Lead    IMPRESSION AND PLAN:  Principal Problem:   Sepsis ([place]) -IV antibiotics started, sepsis is due to pneumonia, blood pressure stable, lactic acid initially elevated but with administration of fluids has corrected to normal limits.  Cultures sent Active Problems:   Acute renal failure superimposed on stage 3 chronic kidney disease (HCC) -IV fluids, avoid nephrotoxins and monitor for improvement   GI bleed -hemoglobin is low but stable, we will check serial hemoglobin, patient is on PPI drip, GI consult ordered   CAP (community acquired pneumonia) -IV antibiotics and supportive treatment PRN   Essential hypertension -continue home meds   CAD, NATIVE VESSEL -continue home medications   Diabetes type 2, uncontrolled (HCC) -sliding scale insulin with corresponding glucose checks    Hyperlipidemia -home dose antilipid  Chart review performed and case discussed with ED provider. Labs, imaging and/or ECG reviewed by provider and discussed with patient/family. Management plans discussed with the patient and/or family.  DVT PROPHYLAXIS: Mechanical only  GI PROPHYLAXIS: PPI  ADMISSION STATUS: Inpatient  CODE STATUS: DNR Code Status History    Date Active Date Inactive Code Status Order ID Comments User Context   09/21/2016 1245 09/26/2016 1836 DNR [id number]  [name], MD ED   10/01/2015 1144 10/02/2015 0748 Full Code [id number]  Bettey Costa, MD Inpatient    Questions for Most Recent Historical Code Status (Order [id number])    Question Answer Comment   In the event of cardiac or respiratory ARREST Do not call a "code blue"    In the event of cardiac or respiratory ARREST Do not perform Intubation, CPR, defibrillation or ACLS    In the event of cardiac or respiratory ARREST Use medication by any route, position, wound care, and other measures to relive pain and suffering. May use oxygen, suction and manual treatment of airway obstruction as needed for comfort.       TOTAL TIME TAKING CARE OF THIS PATIENT: 45 minutes.   Chae Oommen [place] 05/16/2017, 12:50 AM  [facility]  Office  [phone]  CC: Primary care physician; Leonel Ramsay, MD  Note:  This document was prepared using Dragon voice recognition software and may include unintentional dictation errors.

## 2017-05-16 NOTE — Progress Notes (Signed)
Family Meeting Note  Advance Directive:no  Today a meeting took place with the Patient.    The following clinical team members were present during this meeting:MD  The following were discussed:Patient's diagnosis: ,sepssi with PNA  Patient's progosis: > 12 months and Goals for treatment: DNR  Additional follow-up to be provided: chaplain consult to start advanced directives  Time spent during discussion:17 minutes  Amy Funari, MD

## 2017-05-17 DIAGNOSIS — I5032 Chronic diastolic (congestive) heart failure: Secondary | ICD-10-CM

## 2017-05-17 LAB — GLUCOSE, CAPILLARY
GLUCOSE-CAPILLARY: 125 mg/dL — AB (ref 65–99)
GLUCOSE-CAPILLARY: 128 mg/dL — AB (ref 65–99)
GLUCOSE-CAPILLARY: 170 mg/dL — AB (ref 65–99)
GLUCOSE-CAPILLARY: 208 mg/dL — AB (ref 65–99)
Glucose-Capillary: 204 mg/dL — ABNORMAL HIGH (ref 65–99)

## 2017-05-17 LAB — CBC
HEMATOCRIT: 23.2 % — AB (ref 35.0–47.0)
Hemoglobin: 7.5 g/dL — ABNORMAL LOW (ref 12.0–16.0)
MCH: 29.6 pg (ref 26.0–34.0)
MCHC: 32.6 g/dL (ref 32.0–36.0)
MCV: 90.9 fL (ref 80.0–100.0)
Platelets: 239 10*3/uL (ref 150–440)
RBC: 2.55 MIL/uL — ABNORMAL LOW (ref 3.80–5.20)
RDW: 15.2 % — ABNORMAL HIGH (ref 11.5–14.5)
WBC: 8.6 10*3/uL (ref 3.6–11.0)

## 2017-05-17 LAB — BASIC METABOLIC PANEL
ANION GAP: 7 (ref 5–15)
BUN: 31 mg/dL — ABNORMAL HIGH (ref 6–20)
CHLORIDE: 111 mmol/L (ref 101–111)
CO2: 23 mmol/L (ref 22–32)
Calcium: 7.7 mg/dL — ABNORMAL LOW (ref 8.9–10.3)
Creatinine, Ser: 1.41 mg/dL — ABNORMAL HIGH (ref 0.44–1.00)
GFR calc non Af Amer: 32 mL/min — ABNORMAL LOW (ref >60)
GFR, EST AFRICAN AMERICAN: 37 mL/min — AB (ref >60)
Glucose, Bld: 127 mg/dL — ABNORMAL HIGH (ref 65–99)
POTASSIUM: 4.8 mmol/L (ref 3.5–5.1)
SODIUM: 141 mmol/L (ref 135–145)

## 2017-05-17 LAB — HEMOGLOBIN AND HEMATOCRIT, BLOOD
HEMATOCRIT: 28.5 % — AB (ref 35.0–47.0)
HEMOGLOBIN: 9.2 g/dL — AB (ref 12.0–16.0)

## 2017-05-17 LAB — PREPARE RBC (CROSSMATCH)

## 2017-05-17 MED ORDER — FUROSEMIDE 10 MG/ML IJ SOLN
20.0000 mg | Freq: Two times a day (BID) | INTRAMUSCULAR | Status: DC
  Administered 2017-05-18 – 2017-05-19 (×3): 20 mg via INTRAVENOUS
  Filled 2017-05-17 (×3): qty 4

## 2017-05-17 MED ORDER — NOREPINEPHRINE 4 MG/250ML-% IV SOLN
0.0000 ug/min | INTRAVENOUS | Status: DC
Start: 2017-05-17 — End: 2017-05-17

## 2017-05-17 MED ORDER — FUROSEMIDE 10 MG/ML IJ SOLN
20.0000 mg | Freq: Once | INTRAMUSCULAR | Status: AC
  Administered 2017-05-17: 20 mg via INTRAVENOUS
  Filled 2017-05-17: qty 4

## 2017-05-17 MED ORDER — DEXTROSE 5 % IV SOLN
0.0000 ug/min | INTRAVENOUS | Status: DC
  Filled 2017-05-17: qty 4

## 2017-05-17 MED ORDER — SODIUM CHLORIDE 0.9 % IV SOLN
Freq: Once | INTRAVENOUS | Status: AC
  Administered 2017-05-17: 16:00:00 via INTRAVENOUS

## 2017-05-17 MED ORDER — FUROSEMIDE 10 MG/ML IJ SOLN: 40.0000 mg | Freq: Two times a day (BID) | INTRAMUSCULAR | Status: DC

## 2017-05-17 MED ORDER — SODIUM CHLORIDE 0.9 % IV BOLUS (SEPSIS): 1000.0000 mL | Freq: Once | INTRAVENOUS | Status: DC

## 2017-05-17 MED ORDER — SODIUM CHLORIDE 0.9 % IV SOLN
300.0000 mg | Freq: Once | INTRAVENOUS | Status: AC
  Administered 2017-05-17: 300 mg via INTRAVENOUS
  Filled 2017-05-17: qty 15

## 2017-05-17 NOTE — Progress Notes (Addendum)
[facility] [place] NAME: Amy [name]    MR#:  [id number]  DATE OF BIRTH:  04/25/1927  SUBJECTIVE:   SOB this am and noticed LEE  REVIEW OF SYSTEMS:    Review of Systems  Constitutional: Negative for fever, chills weight loss HENT: Negative for ear pain, nosebleeds, congestion, facial swelling, rhinorrhea, neck pain, neck stiffness and ear discharge.   Respiratory: ++ for cough, shortness of breath, NO wheezing  Cardiovascular: Negative for chest pain, palpitations and ++ LEE.  Gastrointestinal: Negative for heartburn, abdominal pain, vomiting, diarrhea or consitpation Genitourinary: Negative for dysuria, urgency, frequency, hematuria Musculoskeletal: Negative for back pain or joint pain Neurological: Negative for dizziness, seizures, syncope, focal weakness,  numbness and headaches.  Hematological: Does not bruise/bleed easily.  Psychiatric/Behavioral: Negative for hallucinations, confusion, dysphoric mood    Tolerating Diet:yes      DRUG ALLERGIES:   Allergies  Allergen Reactions  . Losartan Other (See Comments)    Hyperkalemia  . Morphine And Related Shortness Of Breath    Rash, difficulty breathing, nausea.   . Atenolol Other (See Comments)    Other reaction(s): Other (See Comments) Decreased heart rate Decreased heart rate  . Codeine     Rash, difficulty breathing, nausea.  . Nsaids Other (See Comments)    Other reaction(s): Unknown  . Rofecoxib     Other reaction(s): Unknown  . Sucralfate Other (See Comments)    Throat tightness  . Sulfa Antibiotics     Other reaction(s): Unknown    VITALS:  Blood pressure (!) 158/50, pulse 60, temperature 98.5 F (36.9 C), temperature source Oral, resp. rate 20, height 5\' 2"  (1.575 m), weight 95.9 kg (211 lb 6.4 oz), SpO2 97 %.  PHYSICAL EXAMINATION:  Constitutional: Appears well-developed and well-nourished. No distress. HENT: Normocephalic. Marland Kitchen Oropharynx is clear  and moist.  Eyes: Conjunctivae and EOM are normal. PERRLA, no scleral icterus.  Neck: Normal ROM. Neck supple. No JVD. No tracheal deviation. CVS: RRR, S1/S2 +, no murmurs, no gallops, no carotid bruit.  Pulmonary: Effort and breath sounds normal, no stridor, rhonchi, wheezes, rales.  Abdominal: Soft. BS +,  no distension, tenderness, rebound or guarding.  Musculoskeletal: Normal range of motion. 1+ LEE and no tenderness.  Neuro: Alert. CN 2-12 grossly intact. No focal deficits. Skin: Skin is warm and dry. No rash noted. Psychiatric: Normal mood and affect.      LABORATORY PANEL:   CBC Recent Labs  Lab 05/17/17 0607  WBC 8.6  HGB 7.5*  HCT 23.2*  PLT 239   ------------------------------------------------------------------------------------------------------------------  Chemistries  Recent Labs  Lab 05/15/17 2134  05/17/17 0607  NA  --    < > 141  K  --    < > 4.8  CL  --    < > 111  CO2  --    < > 23  GLUCOSE  --    < > 127*  BUN  --    < > 31*  CREATININE  --    < > 1.41*  CALCIUM  --    < > 7.7*  AST 26  --   --   ALT 11*  --   --   ALKPHOS 70  --   --   BILITOT 0.5  --   --    < > = values in this interval not displayed.   ------------------------------------------------------------------------------------------------------------------  Cardiac Enzymes Recent Labs  Lab 05/15/17 1550  TROPONINI <0.03   ------------------------------------------------------------------------------------------------------------------  RADIOLOGY:  Dg Chest 2 View  Result Date: 05/15/2017 CLINICAL DATA:  Pneumonia EXAM: CHEST - 2 VIEW COMPARISON:  05/14/2017 FINDINGS: Patchy airspace disease has not developed throughout both lungs. It has a nodular appearance. This has progressed since the prior study. Cardiomegaly. No pneumothorax. No pleural effusion. IMPRESSION: Airspace disease has worsened. There is now patchy airspace disease throughout both lungs. Electronically Signed    By: Marybelle Killings M.D.   On: 05/15/2017 16:19     ASSESSMENT AND PLAN:   84 year old female with chronic kidney disease stage III, diabetes, morbid obesity and chronic diastolic heart failure with preserved ejection fraction who presents with progressive shortness of breath and melena.  1.  Shortness of breath: This is multifactorial due to community-acquired pneumonia as well as acute on chronic diastolic heart failure in addition to acute blood loss anemia  2. CAP: Continue Rocephin and azithromycin  3.  Acute on chronic diastolic heart failure with preserved ejection fraction: Start 20 mg IV twice daily Lasix Monitor intake and output with daily weight CHF clinic upon discharge [facility] cardiology follow-up upon discharge   4.  Acute on chronic iron deficiency anemia: One-time dose of venofer ordered today due to low iron on anemai panel Transfuse 1 unit PRBC as well. Lasix between blood transfusion.  5.  Melena: Patient is planned for EGD likely Monday or Tuesday.  Plavix needed to be stopped for 3-5 days prior to EGD.  6.  PAF: Patient is in normal sinus rhythm Heart rate controlled   7.  CAD status post LAD stent in 2011: As per cardiology holding Plavix for EGD is reasonable.   Continue statin 8.  Essential hypertension: Continue clonidine and Cardura  9.  Diabetes: On sliding scale  10.  Hypothyroidism: Continue Synthroid  Physical therapy is recommending home health upon discharge  Management plans discussed with the patient and she is in agreement.  CODE STATUS: DNR  TOTAL TIME TAKING CARE OF THIS PATIENT: 30 minutes.     POSSIBLE D/C tuesday, DEPENDING ON CLINICAL CONDITION.   [name] M.D on 05/17/2017 at 12:11 PM  Between 7am to 6pm - Pager - [phone] After 6pm go to [email or web address] - password [email or web address] [facility]  Office  [phone]  CC: Primary care physician; Leonel Ramsay, MD  Note: This dictation was prepared  with Dragon dictation along with smaller phrase technology. Any transcriptional errors that result from this process are unintentional.

## 2017-05-17 NOTE — Plan of Care (Addendum)
Pt receving one unit of blood and iron transfusion. Pt with c/o nausea, headache and SOB. Oxygen saturations are normal but pt is wearing oxygen for comfort. Up to chair and mobile in room. PRN medication given for elevated BP

## 2017-05-18 ENCOUNTER — Inpatient Hospital Stay: Payer: Medicare Other

## 2017-05-18 DIAGNOSIS — D500 Iron deficiency anemia secondary to blood loss (chronic): Secondary | ICD-10-CM

## 2017-05-18 DIAGNOSIS — R042 Hemoptysis: Secondary | ICD-10-CM

## 2017-05-18 LAB — BASIC METABOLIC PANEL
ANION GAP: 7 (ref 5–15)
BUN: 21 mg/dL — AB (ref 6–20)
CO2: 25 mmol/L (ref 22–32)
Calcium: 7.9 mg/dL — ABNORMAL LOW (ref 8.9–10.3)
Chloride: 111 mmol/L (ref 101–111)
Creatinine, Ser: 1.3 mg/dL — ABNORMAL HIGH (ref 0.44–1.00)
GFR calc Af Amer: 41 mL/min — ABNORMAL LOW (ref >60)
GFR, EST NON AFRICAN AMERICAN: 36 mL/min — AB (ref >60)
GLUCOSE: 136 mg/dL — AB (ref 65–99)
POTASSIUM: 4.2 mmol/L (ref 3.5–5.1)
Sodium: 143 mmol/L (ref 135–145)

## 2017-05-18 LAB — BPAM RBC
BLOOD PRODUCT EXPIRATION DATE: 201904102359
ISSUE DATE / TIME: 201903241627
Unit Type and Rh: 5100

## 2017-05-18 LAB — BODY FLUID CELL COUNT WITH DIFFERENTIAL
Eos, Fluid: 0 %
LYMPHS FL: 60 %
Monocyte-Macrophage-Serous Fluid: 16 %
NEUTROPHIL FLUID: 23 %
OTHER CELLS FL: 1 %
Total Nucleated Cell Count, Fluid: 17 cu mm

## 2017-05-18 LAB — CBC
HEMATOCRIT: 25.7 % — AB (ref 35.0–47.0)
Hemoglobin: 8.5 g/dL — ABNORMAL LOW (ref 12.0–16.0)
MCH: 29.6 pg (ref 26.0–34.0)
MCHC: 33.1 g/dL (ref 32.0–36.0)
MCV: 89.5 fL (ref 80.0–100.0)
PLATELETS: 258 10*3/uL (ref 150–440)
RBC: 2.88 MIL/uL — AB (ref 3.80–5.20)
RDW: 15 % — ABNORMAL HIGH (ref 11.5–14.5)
WBC: 10.9 10*3/uL (ref 3.6–11.0)

## 2017-05-18 LAB — TYPE AND SCREEN
ABO/RH(D): O POS
ANTIBODY SCREEN: NEGATIVE
Unit division: 0

## 2017-05-18 LAB — GLUCOSE, CAPILLARY
GLUCOSE-CAPILLARY: 196 mg/dL — AB (ref 65–99)
GLUCOSE-CAPILLARY: 124 mg/dL — AB (ref 65–99)
Glucose-Capillary: 145 mg/dL — ABNORMAL HIGH (ref 65–99)
Glucose-Capillary: 134 mg/dL — ABNORMAL HIGH (ref 65–99)

## 2017-05-18 MED ORDER — INSULIN ASPART 100 UNIT/ML ~~LOC~~ SOLN
0.0000 [IU] | Freq: Three times a day (TID) | SUBCUTANEOUS | Status: DC
Start: 2017-05-18 — End: 2017-05-20
  Administered 2017-05-18: 2 [IU] via SUBCUTANEOUS
  Administered 2017-05-19: 1 [IU] via SUBCUTANEOUS
  Administered 2017-05-19: 7 [IU] via SUBCUTANEOUS
  Administered 2017-05-20: 1 [IU] via SUBCUTANEOUS
  Administered 2017-05-20: 3 [IU] via SUBCUTANEOUS
  Filled 2017-05-18 (×5): qty 1

## 2017-05-18 MED ORDER — ATORVASTATIN CALCIUM 20 MG PO TABS
20.0000 mg | ORAL_TABLET | Freq: Every day | ORAL | Status: DC
  Administered 2017-05-18 – 2017-05-19 (×2): 20 mg via ORAL
  Filled 2017-05-18 (×2): qty 1

## 2017-05-18 MED ORDER — AZITHROMYCIN 250 MG PO TABS
500.0000 mg | ORAL_TABLET | Freq: Every day | ORAL | Status: AC
  Administered 2017-05-18 – 2017-05-19 (×2): 500 mg via ORAL
  Filled 2017-05-18 (×2): qty 2

## 2017-05-18 MED ORDER — LEVOTHYROXINE SODIUM 125 MCG PO TABS
125.0000 ug | ORAL_TABLET | Freq: Every day | ORAL | Status: DC
  Administered 2017-05-18 – 2017-05-20 (×3): 125 ug via ORAL
  Filled 2017-05-18 (×3): qty 1

## 2017-05-18 MED ORDER — INSULIN ASPART 100 UNIT/ML ~~LOC~~ SOLN: 0.0000 [IU] | Freq: Every day | SUBCUTANEOUS | Status: DC

## 2017-05-18 MED ORDER — AMLODIPINE BESYLATE 10 MG PO TABS
10.0000 mg | ORAL_TABLET | Freq: Every day | ORAL | Status: DC
  Administered 2017-05-19 – 2017-05-20 (×2): 10 mg via ORAL
  Filled 2017-05-18 (×2): qty 1

## 2017-05-18 NOTE — Progress Notes (Signed)
[facility] [place] NAME: Amy [name]    MR#:  [id number]  DATE OF BIRTH:  May 05, 1927  SUBJECTIVE:   Continues to have shortness of breath no further melena due to her respiratory status GI defers endoscopy at this time  REVIEW OF SYSTEMS:    Review of Systems  Constitutional: Negative for fever, chills weight loss HENT: Negative for ear pain, nosebleeds, congestion, facial swelling, rhinorrhea, neck pain, neck stiffness and ear discharge.   Respiratory: ++ for cough, shortness of breath, NO wheezing  Cardiovascular: Negative for chest pain, palpitations and ++ LEE.  Gastrointestinal: Negative for heartburn, abdominal pain, vomiting, diarrhea or consitpation Genitourinary: Negative for dysuria, urgency, frequency, hematuria Musculoskeletal: Negative for back pain or joint pain Neurological: Negative for dizziness, seizures, syncope, focal weakness,  numbness and headaches.  Hematological: Does not bruise/bleed easily.  Psychiatric/Behavioral: Negative for hallucinations, confusion, dysphoric mood    Tolerating Diet:yes      DRUG ALLERGIES:   Allergies  Allergen Reactions  . Losartan Other (See Comments)    Hyperkalemia  . Morphine And Related Shortness Of Breath    Rash, difficulty breathing, nausea.   . Atenolol Other (See Comments)    Other reaction(s): Other (See Comments) Decreased heart rate Decreased heart rate  . Codeine     Rash, difficulty breathing, nausea.  . Nsaids Other (See Comments)    Other reaction(s): Unknown  . Rofecoxib     Other reaction(s): Unknown  . Sucralfate Other (See Comments)    Throat tightness  . Sulfa Antibiotics     Other reaction(s): Unknown    VITALS:  Blood pressure (!) 183/43, pulse 73, temperature 98.4 F (36.9 C), resp. rate 20, height 5\' 2"  (1.575 m), weight 217 lb 6 oz (98.6 kg), SpO2 97 %.  PHYSICAL EXAMINATION:  Constitutional: Appears well-developed and  well-nourished. No distress. HENT: Normocephalic. Marland Kitchen Oropharynx is clear and moist.  Eyes: Conjunctivae and EOM are normal. PERRLA, no scleral icterus.  Neck: Normal ROM. Neck supple. No JVD. No tracheal deviation. CVS: RRR, S1/S2 +, no murmurs, no gallops, no carotid bruit.  Pulmonary: Effort and breath sounds normal, no stridor, rhonchi, wheezes, rales.  Abdominal: Soft. BS +,  no distension, tenderness, rebound or guarding.  Musculoskeletal: Normal range of motion. 1+ LEE and no tenderness.  Neuro: Alert. CN 2-12 grossly intact. No focal deficits. Skin: Skin is warm and dry. No rash noted. Psychiatric: Normal mood and affect.      LABORATORY PANEL:   CBC Recent Labs  Lab 05/18/17 0457  WBC 10.9  HGB 8.5*  HCT 25.7*  PLT 258   ------------------------------------------------------------------------------------------------------------------  Chemistries  Recent Labs  Lab 05/15/17 2134  05/18/17 0457  NA  --    < > 143  K  --    < > 4.2  CL  --    < > 111  CO2  --    < > 25  GLUCOSE  --    < > 136*  BUN  --    < > 21*  CREATININE  --    < > 1.30*  CALCIUM  --    < > 7.9*  AST 26  --   --   ALT 11*  --   --   ALKPHOS 70  --   --   BILITOT 0.5  --   --    < > = values in this interval not displayed.   ------------------------------------------------------------------------------------------------------------------  Cardiac Enzymes Recent  Labs  Lab 05/15/17 1550  TROPONINI <0.03   ------------------------------------------------------------------------------------------------------------------  RADIOLOGY:  Dg Chest 1 View  Result Date: 05/18/2017 CLINICAL DATA:  Status post left thoracentesis EXAM: CHEST  1 VIEW COMPARISON:  05/18/2017 FINDINGS: Cardiac shadow remains enlarged. Previously seen left pleural effusion has been removed. No pneumothorax is noted following thoracentesis. Some patchy atelectatic changes are noted in both lungs. No bony abnormality is  seen. IMPRESSION: No pneumothorax following thoracentesis on the left. Electronically Signed   By: Inez Catalina M.D.   On: 05/18/2017 15:47   Dg Chest 1 View  Result Date: 05/18/2017 CLINICAL DATA:  Congestive heart failure. EXAM: CHEST  1 VIEW COMPARISON:  Radiograph of May 15, 2017. FINDINGS: Stable cardiomegaly. Atherosclerosis of thoracic aorta is noted. No pneumothorax is noted. Interval development of moderate left pleural effusion with associated atelectasis or infiltrate. Increased interstitial densities are noted in the right lung base concerning for worsening edema or atelectasis. Bony thorax is unremarkable. IMPRESSION: Interval development of moderate left pleural effusion with associated atelectasis or infiltrate. Increased right basilar edema or atelectasis is noted. Aortic Atherosclerosis (ICD10-I70.0). Electronically Signed   By: Marijo Conception, M.D.   On: 05/18/2017 07:20   US Thoracentesis Asp Pleural Space W/img Guide  Result Date: 05/18/2017 INDICATION: Left pleural effusion EXAM: ULTRASOUND GUIDED LEFT THORACENTESIS MEDICATIONS: None. COMPLICATIONS: None immediate. PROCEDURE: An ultrasound guided thoracentesis was thoroughly discussed with the patient and questions answered. The benefits, risks, alternatives and complications were also discussed. The patient understands and wishes to proceed with the procedure. Written consent was obtained. Ultrasound was performed to localize and mark an adequate pocket of fluid in the left chest. The area was then prepped and draped in the normal sterile fashion. 1% Lidocaine was used for local anesthesia. Under ultrasound guidance a 6 Fr Safe-T-Centesis catheter was introduced. Thoracentesis was performed. The catheter was removed and a dressing applied. FINDINGS: A total of approximately 600 mL of clear yellow fluid was removed. Samples were sent to the laboratory as requested by the clinical team. IMPRESSION: Successful ultrasound guided left  thoracentesis yielding 600 mL of pleural fluid. Electronically Signed   By: Inez Catalina M.D.   On: 05/18/2017 15:45     ASSESSMENT AND PLAN:   84 year old female with chronic kidney disease stage III, diabetes, morbid obesity and chronic diastolic heart failure with preserved ejection fraction who presents with progressive shortness of breath and melena.  1.  Shortness of breath: This is multifactorial due to community-acquired pneumonia as well as acute on chronic diastolic heart failure in addition to acute blood loss anemia Current accumulation of pleural effusion I will have radiology drain this  2. CAP: Continue Rocephin stop azithromycin   3.  Acute on chronic diastolic heart failure with preserved ejection fraction: Continue Lasix 20 mg IV twice daily Lasix Monitor intake and output with daily weight CHF clinic upon discharge [facility] cardiology follow-up upon discharge   4.  Acute on chronic iron deficiency anemia: One-time dose of venofer ordered today due to low iron on anemai panel Status post transfusion  5.  Melena: No further episodes was seen by GI Continue PPIs Follow hemoglobin No plan for endoscopy due to her respiratory status  6.  PAF: Patient is in normal sinus rhythm Heart rate controlled   7.  CAD status post LAD stent in 2011: As per cardiology holding Plavix for EGD is reasonable.   Continue statin  8.  Essential hypertension: Continue clonidine and Cardura  9.  Diabetes:  On sliding scale  10.  Hypothyroidism: Continue Synthroid  Physical therapy is recommending home health upon discharge  Management plans discussed with the patient and she is in agreement.  CODE STATUS: DNR  TOTAL TIME TAKING CARE OF THIS PATIENT: 30 minutes.     POSSIBLE D/C tuesday, DEPENDING ON CLINICAL CONDITION.   Dustin Flock M.D on 05/18/2017 at 4:10 PM  Between 7am to 6pm - Pager - [phone] After 6pm go to [email or web address] - password [email or web address] [facility]  Office  [phone]  CC: Primary care physician; Leonel Ramsay, MD  Note: This dictation was prepared with Dragon dictation along with smaller phrase technology. Any transcriptional errors that result from this process are unintentional.

## 2017-05-18 NOTE — Procedures (Signed)
[place] left thoracentesis without difficulty  Complications:  None  Blood Loss: none  See dictation in canopy pacs

## 2017-05-18 NOTE — Care Management Note (Signed)
Case Management Note  Patient Details  Name: Amy [name] MRN: [id number] Date of Birth: [birth year]  Subjective/Objective:  Met with patient at bedside to discuss discharge planning. Patient lives at home alone. Prior to admission, she was driving and going to [employer] 2 days a week to exercise. Pt now recommending home health PT. Patient has no choice of home health agencies. Referral to [facility] with [facility] for HHPT. She hs a walker but mostly holds on to the walls and furniture to ambulate. No DME needed. PCP is Dr. Ola Spurr.                    Action/Plan: [facility] for HHPT  Expected Discharge Date:  05/20/17               Expected Discharge Plan:  [place]  In-House Referral:     Discharge planning Services  CM Consult  Post Acute Care Choice:  Home Health Choice offered to:  Patient  DME Arranged:    DME Agency:     HH Arranged:  PT [place]:  [place]  Status of Service:  In process, will continue to follow  If discussed at Long Length of Stay Meetings, dates discussed:    Additional Comments:  Jolly Mango, RN 05/18/2017, 2:47 PM

## 2017-05-18 NOTE — Progress Notes (Signed)
Progress Note  Patient Name: Amy [name] Date of Encounter: 05/18/2017  Primary Cardiologist: Ida Rogue, MD   Subjective   Says her breathing has slightly improved but not at baseline. Was having chest pain with coughing, now resolved. Frequent urination this morning following administration of IV Lasix.   Inpatient Medications    Scheduled Meds: . cloNIDine  0.2 mg Transdermal Weekly  . doxazosin  8 mg Oral BID  . furosemide  20 mg Intravenous BID  . insulin aspart  0-9 Units Subcutaneous Q6H  . levothyroxine  125 mcg Oral QAC breakfast  . mometasone-formoterol  2 puff Inhalation BID  . [START ON 05/19/2017] pantoprazole  40 mg Intravenous Q12H  . simvastatin  40 mg Oral QHS   Continuous Infusions: . azithromycin Stopped (05/17/17 1935)  . cefTRIAXone (ROCEPHIN) IVPB 1 gram/100 mL NS (Mini-Bag Plus) Stopped (05/17/17 1827)  . pantoprozole (PROTONIX) infusion 8 mg/hr (05/17/17 2330)   PRN Meds: acetaminophen **OR** acetaminophen, benzonatate, guaiFENesin-dextromethorphan, hydrALAZINE, hydrALAZINE, ipratropium-albuterol, ondansetron **OR** ondansetron (ZOFRAN) IV   Vital Signs    Vitals:   05/17/17 1908 05/17/17 2220 05/18/17 0526 05/18/17 0532  BP: (!) 160/42 (!) 183/42 (!) 190/44 (!) 155/45  Pulse: 73 78 68   Resp: (!) 21 20 18    Temp: 98.1 F (36.7 C) 100 F (37.8 C) 98.3 F (36.8 C)   TempSrc: Oral Oral Oral   SpO2: 95% 92% 97%   Weight:    217 lb 6 oz (98.6 kg)  Height:        Intake/Output Summary (Last 24 hours) at 05/18/2017 0933 Last data filed at 05/18/2017 4128 Gross per 24 hour  Intake 2164.89 ml  Output 2450 ml  Net -285.11 ml   Filed Weights   05/16/17 0231 05/17/17 0431 05/18/17 0532  Weight: 203 lb 8 oz (92.3 kg) 211 lb 6.4 oz (95.9 kg) 217 lb 6 oz (98.6 kg)    Telemetry    NSR, HR in 60's to 80's. No ectopic events. - Personally Reviewed  ECG    No new tracings.   Physical Exam   General: Well developed, elderly  Caucasian female appearing in no acute distress. Head: Normocephalic, atraumatic.  Neck: Supple without bruits, JVD not elevated. Lungs:  Resp regular and unlabored, CTA without wheezing or rales. Heart: RRR, S1, S2, no S3, S4, or murmur; no rub. Abdomen: Soft, non-tender, non-distended with normoactive bowel sounds. No hepatomegaly. No rebound/guarding. No obvious abdominal masses. Extremities: No clubbing or cyanosis, 1+ pitting edema bilaterally. Distal pedal pulses are 2+ bilaterally. Neuro: Alert and oriented X 3. Moves all extremities spontaneously. Psych: Normal affect.  Labs    Chemistry Recent Labs  Lab 05/15/17 2134 05/16/17 0738 05/17/17 0607 05/18/17 0457  NA  --  143 141 143  K  --  4.5 4.8 4.2  CL  --  113* 111 111  CO2  --  23 23 25   GLUCOSE  --  127* 127* 136*  BUN  --  36* 31* 21*  CREATININE  --  1.45* 1.41* 1.30*  CALCIUM  --  7.8* 7.7* 7.9*  PROT 5.9*  --   --   --   ALBUMIN 2.9*  --   --   --   AST 26  --   --   --   ALT 11*  --   --   --   ALKPHOS 70  --   --   --   BILITOT 0.5  --   --   --  GFRNONAA  --  31* 32* 36*  GFRAA  --  36* 37* 41*  ANIONGAP  --  7 7 7      Hematology Recent Labs  Lab 05/16/17 0738 05/17/17 0607 05/17/17 2243 05/18/17 0457  WBC 7.8 8.6  --  10.9  RBC 2.63*  2.59* 2.55*  --  2.88*  HGB 7.8* 7.5* 9.2* 8.5*  HCT 23.6* 23.2* 28.5* 25.7*  MCV 89.6 90.9  --  89.5  MCH 29.6 29.6  --  29.6  MCHC 33.0 32.6  --  33.1  RDW 14.7* 15.2*  --  15.0*  PLT 225 239  --  258    Cardiac Enzymes Recent Labs  Lab 05/15/17 1550  TROPONINI <0.03   No results for input(s): TROPIPOC in the last 168 hours.   BNPNo results for input(s): BNP, PROBNP in the last 168 hours.   DDimer No results for input(s): DDIMER in the last 168 hours.   Radiology    Dg Chest 1 View  Result Date: 05/18/2017 CLINICAL DATA:  Congestive heart failure. EXAM: CHEST  1 VIEW COMPARISON:  Radiograph of May 15, 2017. FINDINGS: Stable cardiomegaly.  Atherosclerosis of thoracic aorta is noted. No pneumothorax is noted. Interval development of moderate left pleural effusion with associated atelectasis or infiltrate. Increased interstitial densities are noted in the right lung base concerning for worsening edema or atelectasis. Bony thorax is unremarkable. IMPRESSION: Interval development of moderate left pleural effusion with associated atelectasis or infiltrate. Increased right basilar edema or atelectasis is noted. Aortic Atherosclerosis (ICD10-I70.0). Electronically Signed   By: Marijo Conception, M.D.   On: 05/18/2017 07:20    Cardiac Studies   Echocardiogram: 09/2016 Study Conclusions  - Left ventricle: The cavity size was normal. Wall thickness was   increased in a pattern of moderate LVH. Systolic function was   vigorous. The estimated ejection fraction was in the range of 65%   to 70%. Features are consistent with a pseudonormal left   ventricular filling pattern, with concomitant abnormal relaxation   and increased filling pressure (grade 2 diastolic dysfunction).   Doppler parameters are consistent with high ventricular filling   pressure. - Mitral valve: Calcified annulus. - Left atrium: The atrium was mildly dilated. - Right ventricle: The cavity size was mildly dilated. Systolic   function was normal. - Right atrium: The atrium was mildly dilated. - Pericardium, extracardiac: A small pericardial effusion was   identified.  Patient Profile     84 y.o. female w/ PMH of CAD (s/p stenting of LAD in 2011), carotid artery disease, chronic diastolic CHF, PAF (remote occurrence, not on anticoagulation), HTN, HLD, Type 2 DM, Stage 3 CKD and morbid obesity who presented to [facility] on 05/15/2017 for evaluation of worsening dyspnea and productive cough after being diagnosed with PNA by her PCP. Also found to have worsening anemia and melena. Cardiology consulted due to the need to hold Plavix for EGD per GI request.   Assessment & Plan      1. CAD - s/p stenting of LAD in 2011 with recent NST in 03/2017 showing no significant ischemia and overall being a low-risk study.  - she did experience pleuritic pain with coughing in the setting of PNA but denies any exertional symptoms. Plavix has been held since admission on 3/22. Continue to hold until cleared by GI following planned EGD. She had been on both ASA and Plavix until 2018 with ASA being discontinued at that time in the setting of GIB. Would defer to  Primary Cardiologist about switching to ASA alone following GI workup as her most recent intervention was 8 years ago.   2. Chronic Diastolic CHF - the patient has a preserved EF of 65-70% by echo in 2018. - Lungs are clear on examination but she does have lower extremity edema, new since recent blood transfusions. Receiving IV Lasix 20mg  BID. Would recommend re-starting PTA PO Lasix 20mg  tomorrow.   3. History of PAF - remote history and no longer on full-dose anticoagulation. Maintaining NSR by review of telemetry this admission.  4. HTN - BP variable at 151/41 - 194/54 since admission. Was on Amlodipine 10mg  daily, Clonidine 0.2mg /24 hr patch, Doxazosin 8mg  BID, Lasix 20mg  daily, and Hydralazine 25mg  PRN. Would recommend restarting PTA Amlodipine.   5. PNA - remains on Azithromycin and Rocephin. Per admitting team.   6. Anemia/ Melena - Hgb 7.5 on 3/24 --> received 1 unit pRBC's with Hgb improved to 8.5 this AM.  - GI following. Planning for EGD today or Tuesday.     For questions or updates, please contact [place] Please consult [email or web address] for contact info under Cardiology/STEMI.   Arna Medici , PA-C 9:33 AM 05/18/2017 Pager: [phone]

## 2017-05-18 NOTE — Progress Notes (Signed)
PHARMACIST - PHYSICIAN COMMUNICATION  CONCERNING:  Simvastatin 40 mg daily and amlodipine - rhabdomyolysis risk   RECOMMENDATION: Patients on amlodipine and simvastatin >20mg /day have reported cases of rhabdomyolysis. Assess simvastatin dose. If > 20 mg, substitute atorvastatin (Lipitor) 1mg  for each 2mg  simvastatin   DESCRIPTION: Amlodipine 10mg  QD was added today. Pharmacy has discontinued the patient's current order of simvastatin 40mg  and has ordered Atorvastatin 20mg  as a therapeutic substitution per protochol.   Pernell Dupre, PharmD, BCPS Clinical Pharmacist 05/18/2017 12:04 PM

## 2017-05-18 NOTE — Progress Notes (Signed)
Amy Antigua, MD [address] [place], [place], [place], [address] [address], [place], [place], [place], [address] Phone: [phone]  Fax: [phone]   Subjective:  Patient had shortness of breath yesterday, and patient states this has not improved today.  No episodes of active GI bleeding.  Tolerated oral diet yesterday.  No abdominal pain.  States she has had bowel movements today and yesterday and they were brown in color.  Objective: Vital signs in last 24 hours: Vitals:   05/17/17 2220 05/18/17 0526 05/18/17 0532 05/18/17 1136  BP: (!) 183/42 (!) 190/44 (!) 155/45 (!) 169/43  Pulse: 78 68  78  Resp: 20 18    Temp: 100 F (37.8 C) 98.3 F (36.8 C)  98.4 F (36.9 C)  TempSrc: Oral Oral    SpO2: 92% 97%  94%  Weight:   217 lb 6 oz (98.6 kg)   Height:       Weight change: 5 lb 15.6 oz (2.71 kg)  Intake/Output Summary (Last 24 hours) at 05/18/2017 1149 Last data filed at 05/18/2017 1005 Gross per 24 hour  Intake 1684.89 ml  Output 2950 ml  Net -1265.11 ml     Exam: Abd: Soft, NT/ND, No HSM Skin: Warm, no rashes Neck: Supple, Trachea midline   Lab Results: Lab Results  Component Value Date   WBC 10.9 05/18/2017   HGB 8.5 (L) 05/18/2017   HCT 25.7 (L) 05/18/2017   MCV 89.5 05/18/2017   PLT 258 05/18/2017   Micro Results: Recent Results (from the past 240 hour(s))  Blood culture (routine x 2)     Status: None (Preliminary result)   Collection Time: 05/15/17  9:34 PM  Result Value Ref Range Status   Specimen Description BLOOD LEFT FOREARM  Final   Special Requests   Final    BOTTLES DRAWN AEROBIC AND ANAEROBIC Blood Culture adequate volume   Culture   Final    NO GROWTH 3 DAYS Performed at [facility], [address] [place], [place] [address]    Report Status PENDING  Incomplete  Blood culture (routine x 2)     Status: None (Preliminary result)   Collection Time: 05/15/17  9:34 PM  Result Value Ref Range Status   Specimen  Description BLOOD RIGHT ANTECUBITAL  Final   Special Requests   Final    BOTTLES DRAWN AEROBIC AND ANAEROBIC Blood Culture adequate volume   Culture   Final    NO GROWTH 3 DAYS Performed at [facility], [address] [place], [place] [address]    Report Status PENDING  Incomplete   Studies/Results: Dg Chest 1 View  Result Date: 05/18/2017 CLINICAL DATA:  Congestive heart failure. EXAM: CHEST  1 VIEW COMPARISON:  Radiograph of May 15, 2017. FINDINGS: Stable cardiomegaly. Atherosclerosis of thoracic aorta is noted. No pneumothorax is noted. Interval development of moderate left pleural effusion with associated atelectasis or infiltrate. Increased interstitial densities are noted in the right lung base concerning for worsening edema or atelectasis. Bony thorax is unremarkable. IMPRESSION: Interval development of moderate left pleural effusion with associated atelectasis or infiltrate. Increased right basilar edema or atelectasis is noted. Aortic Atherosclerosis (ICD10-I70.0). Electronically Signed   By: Marijo Conception, M.D.   On: 05/18/2017 07:20   Medications:  Scheduled Meds: . [START ON 05/19/2017] amLODipine  10 mg Oral Daily  . cloNIDine  0.2 mg Transdermal Weekly  . doxazosin  8 mg Oral BID  . furosemide  20 mg Intravenous BID  .  insulin aspart  0-9 Units Subcutaneous Q6H  . levothyroxine  125 mcg Oral QAC breakfast  . mometasone-formoterol  2 puff Inhalation BID  . [START ON 05/19/2017] pantoprazole  40 mg Intravenous Q12H  . simvastatin  40 mg Oral QHS   Continuous Infusions: . azithromycin Stopped (05/17/17 1935)  . cefTRIAXone (ROCEPHIN) IVPB 1 gram/100 mL NS (Mini-Bag Plus) Stopped (05/17/17 1827)  . pantoprozole (PROTONIX) infusion 8 mg/hr (05/18/17 1031)   PRN Meds:.acetaminophen **OR** acetaminophen, benzonatate, guaiFENesin-dextromethorphan, hydrALAZINE, ipratropium-albuterol, ondansetron **OR** ondansetron (ZOFRAN) IV   Assessment: Principal Problem:    Sepsis ([place]) Active Problems:   Hyperlipidemia   Essential hypertension   CAD, NATIVE VESSEL   Chronic diastolic CHF (congestive heart failure) (HCC)   Diabetes type 2, uncontrolled (HCC)   Acute renal failure superimposed on stage 3 chronic kidney disease (HCC)   GI bleed   CAP (community acquired pneumonia)  84 year old female who reported melena at home, and is on aspirin and Plavix at home due to history of CAD, presented with shortness of breath and admitted for sepsis from pneumonia, and also complained of hemoptysis at home  Plan: Chest x-ray done today shows interval development of moderate left pleural effusion with associated atelectasis or infiltrate.  Increased right basilar edema or atelectasis. Patchy airspace disease that had worsened was also reported on the admission chest x-ray as well  Given no signs of active GI bleeding at this time, and stable hemoglobin, would hold off on any endoscopic procedures at this time, as melena and active GI bleeding has resolved, and patient has other acute issues including shortness of breath and above-stated imaging findings.  Once patient is clinically optimized from her breathing standpoint, can consider endoscopy at that time as an inpatient in 1-2 days  Continue PPI twice daily Continue serial CBCs and transfuse PRN  Patient is on IV iron as an outpatient, and should continue this as well once she is discharged She should follow-up with [facility] clinic GI at discharge as well   LOS: 2 days   Amy Antigua, MD 05/18/2017, 11:49 AM

## 2017-05-19 DIAGNOSIS — J189 Pneumonia, unspecified organism: Secondary | ICD-10-CM

## 2017-05-19 DIAGNOSIS — I5033 Acute on chronic diastolic (congestive) heart failure: Secondary | ICD-10-CM

## 2017-05-19 LAB — BASIC METABOLIC PANEL
ANION GAP: 9 (ref 5–15)
BUN: 19 mg/dL (ref 6–20)
CALCIUM: 7.9 mg/dL — AB (ref 8.9–10.3)
CO2: 26 mmol/L (ref 22–32)
Chloride: 106 mmol/L (ref 101–111)
Creatinine, Ser: 1.36 mg/dL — ABNORMAL HIGH (ref 0.44–1.00)
GFR calc Af Amer: 39 mL/min — ABNORMAL LOW (ref >60)
GFR calc non Af Amer: 34 mL/min — ABNORMAL LOW (ref >60)
GLUCOSE: 139 mg/dL — AB (ref 65–99)
POTASSIUM: 4 mmol/L (ref 3.5–5.1)
Sodium: 141 mmol/L (ref 135–145)

## 2017-05-19 LAB — GLUCOSE, CAPILLARY
GLUCOSE-CAPILLARY: 318 mg/dL — AB (ref 65–99)
GLUCOSE-CAPILLARY: 108 mg/dL — AB (ref 65–99)
Glucose-Capillary: 127 mg/dL — ABNORMAL HIGH (ref 65–99)
Glucose-Capillary: 145 mg/dL — ABNORMAL HIGH (ref 65–99)
Glucose-Capillary: 183 mg/dL — ABNORMAL HIGH (ref 65–99)

## 2017-05-19 LAB — CBC
HCT: 26.6 % — ABNORMAL LOW (ref 35.0–47.0)
Hemoglobin: 8.6 g/dL — ABNORMAL LOW (ref 12.0–16.0)
MCH: 28.7 pg (ref 26.0–34.0)
MCHC: 32.4 g/dL (ref 32.0–36.0)
MCV: 88.8 fL (ref 80.0–100.0)
Platelets: 260 10*3/uL (ref 150–440)
RBC: 3 MIL/uL — ABNORMAL LOW (ref 3.80–5.20)
RDW: 15.3 % — AB (ref 11.5–14.5)
WBC: 8.8 10*3/uL (ref 3.6–11.0)

## 2017-05-19 LAB — PATHOLOGIST SMEAR REVIEW

## 2017-05-19 MED ORDER — FUROSEMIDE 40 MG PO TABS
40.0000 mg | ORAL_TABLET | Freq: Every day | ORAL | Status: DC
  Administered 2017-05-20: 40 mg via ORAL
  Filled 2017-05-19: qty 1

## 2017-05-19 MED ORDER — POTASSIUM CHLORIDE CRYS ER 20 MEQ PO TBCR
20.0000 meq | EXTENDED_RELEASE_TABLET | Freq: Every day | ORAL | Status: DC
  Administered 2017-05-19 – 2017-05-20 (×2): 20 meq via ORAL
  Filled 2017-05-19 (×2): qty 1

## 2017-05-19 NOTE — Progress Notes (Addendum)
Physical Therapy Treatment Patient Details Name: Amy [name] MRN: [id number] DOB: 1927/09/24 Today's Date: 05/19/2017    History of Present Illness presented to ER secondary to SOB, melena; admitted with sepsis related to PNA.    PT Comments    Pt in chair.  BP 168/40, P 84 O2 96% on room air.  Pt reports feeling more fatigued today and needing to use bathroom.  Transferred to bedside commode with min guard/assist to stable commode.  Participated in exercises as described below.  Pt with increased difficulty with mobility today.  Session limited by general fatigue and SOB despite O2 sats remaining in high 90's in room air during session.  Discussed with care management and primary nurse.  Will leave recommendations at [place] at this time in hopes that she improves with medical interventions.  She was able to ambulate 35' with min guard on evaluation on Saturday but is nowhere near that level of mobility today.  Will need to consider SNF for rehab if she does not improve significantly before discharge.      Follow Up Recommendations  Home health PT  - see above.     Equipment Recommendations  Rolling walker with 5" wheels    Recommendations for Other Services       Precautions / Restrictions Precautions Precautions: Fall Restrictions Weight Bearing Restrictions: No    Mobility  Bed Mobility               General bed mobility comments: in recliner  Transfers Overall transfer level: Needs assistance Equipment used: None Transfers: Stand Pivot Transfers Sit to Stand: Min guard         General transfer comment: to/from bedside commode  Ambulation/Gait             General Gait Details: unable to ambulate today due to SOB and fatigue   Stairs            Wheelchair Mobility    Modified Rankin (Stroke Patients Only)       Balance Overall balance assessment: Needs assistance Sitting-balance support: No upper extremity supported;Feet  supported Sitting balance-Leahy Scale: Good     Standing balance support: Bilateral upper extremity supported Standing balance-Leahy Scale: Fair                              Cognition Arousal/Alertness: Awake/alert Behavior During Therapy: WFL for tasks assessed/performed Overall Cognitive Status: Within Functional Limits for tasks assessed                                        Exercises Other Exercises Other Exercises: ankle pumps, LAQ and marches x 10 in sitting - fatigued.    General Comments        Pertinent Vitals/Pain Pain Assessment: 0-10 Pain Score: 2  Pain Location: L chest where proceedure was yesterday. Pain Descriptors / Indicators: Sore Pain Intervention(s): Limited activity within patient's tolerance    Home Living                      Prior Function            PT Goals (current goals can now be found in the care plan section) Progress towards PT goals: Not progressing toward goals - comment    Frequency    Min 2X/week  PT Plan Current plan remains appropriate;Other (comment)    Co-evaluation              AM-PAC PT "6 Clicks" Daily Activity  Outcome Measure  Difficulty turning over in bed (including adjusting bedclothes, sheets and blankets)?: A Little Difficulty moving from lying on back to sitting on the side of the bed? : A Little Difficulty sitting down on and standing up from a chair with arms (e.g., wheelchair, bedside commode, etc,.)?: A Little Help needed moving to and from a bed to chair (including a wheelchair)?: A Little Help needed walking in hospital room?: A Little Help needed climbing 3-5 steps with a railing? : A Little 6 Click Score: 18    End of Session Equipment Utilized During Treatment: Gait belt Activity Tolerance: Patient limited by fatigue Patient left: in chair;with chair alarm set;with call bell/phone within reach Nurse Communication: Mobility status;Other  (comment)       Time: 5621-3086 PT Time Calculation (min) (ACUTE ONLY): 23 min  Charges:  $Therapeutic Exercise: 8-22 mins $Therapeutic Activity: 8-22 mins                    G Codes:       [name], PTA 05/19/17, 10:40 AM

## 2017-05-19 NOTE — NC FL2 (Signed)
[place] LEVEL OF CARE SCREENING TOOL     IDENTIFICATION  Patient Name: Amy [name] Birthdate: 10/14/1927 Sex: female Admission Date (Current Location): 05/15/2017  [place] and [place] Number:  Engineering geologist and Address:  [facility], [address] [place], [place] [address]      Provider Number: [id number]  Attending Physician Name and Address:  Dustin Flock, MD  Relative Name and Phone Number:       Current Level of Care: Hospital Recommended Level of Care: [place] Prior Approval Number:    Date Approved/Denied:   PASRR Number: [id number]  Discharge Plan: SNF    Current Diagnoses: Patient Active Problem List   Diagnosis Date Noted  . Sepsis ([place]) 05/16/2017  . CAP (community acquired pneumonia) 05/16/2017  . GI bleed 09/21/2016  . Vitamin D deficiency 11/04/2015  . Multiple lung nodules   . Acute bronchitis   . Bronchitis with obstruction ([place])   . New onset atrial fibrillation ([place]) 10/04/2015  . Anemia 10/04/2015  . COPD exacerbation ([place]) 10/04/2015  . Acute renal failure superimposed on stage 3 chronic kidney disease ([place]) 10/03/2015  . Acute on chronic diastolic (congestive) heart failure ([place]) 10/01/2015  . Advanced directives, counseling/discussion 06/05/2015  . Morbid obesity ([place]) 12/05/2014  . Angina pectoris associated with type 2 diabetes mellitus ([place]) 12/05/2014  . Diabetes type 2, uncontrolled ([place]) 12/05/2014  . Chronic diastolic CHF (congestive heart failure) ([place]) 09/06/2013  . Preop cardiovascular exam 06/03/2012  . Iron deficiency anemia 09/08/2011  . DYSPNEA 01/21/2010  . Hyperlipidemia 07/18/2009  . CAD, NATIVE VESSEL 07/18/2009  . HYPOTHYROIDISM-IATROGENIC 07/12/2009  . Essential hypertension 07/12/2009    Orientation RESPIRATION BLADDER Height & Weight     Self, Time, Situation, Place  Normal Continent Weight: 207 lb 14.3 oz (94.3 kg) Height:  5\' 2"   (157.5 cm)  BEHAVIORAL SYMPTOMS/MOOD NEUROLOGICAL BOWEL NUTRITION STATUS  (none) (none) Continent    AMBULATORY STATUS COMMUNICATION OF NEEDS Skin   Limited Assist Verbally Normal                       Personal Care Assistance Level of Assistance  Bathing, Dressing Bathing Assistance: Limited assistance   Dressing Assistance: Limited assistance     Functional Limitations Info  (none)          SPECIAL CARE FACTORS FREQUENCY  PT (By licensed PT)                    Contractures Contractures Info: Not present    Additional Factors Info  Code Status, Allergies Code Status Info: dnr Allergies Info: losartan, morphine, atenolol, codeine, rofecoxib, nsaids, sulfa abx, sulcrafate           Current Medications (05/19/2017):  This is the current hospital active medication list Current Facility-Administered Medications  Medication Dose Route Frequency Provider Last Rate Last Dose  . acetaminophen (TYLENOL) tablet 650 mg  650 mg Oral Q6H PRN Lance Coon, MD   650 mg at 05/18/17 2201   Or  . acetaminophen (TYLENOL) suppository 650 mg  650 mg Rectal Q6H PRN Lance Coon, MD      . amLODipine (NORVASC) tablet 10 mg  10 mg Oral Daily Bernerd Pho M, PA-C   10 mg at 05/19/17 0759  . atorvastatin (LIPITOR) tablet 20 mg  20 mg Oral q1800 Pernell Dupre, RPH   20 mg at 05/18/17 1806  . azithromycin (ZITHROMAX) tablet 500 mg  500  mg Oral Daily Dustin Flock, MD   500 mg at 05/18/17 1806  . benzonatate (TESSALON) capsule 200 mg  200 mg Oral TID PRN Lance Coon, MD   200 mg at 05/17/17 2112  . cefTRIAXone (ROCEPHIN) 1 g in sodium chloride 0.9 % 100 mL IVPB  1 g Intravenous Q24H Lance Coon, MD   Stopped at 05/18/17 1900  . cloNIDine (CATAPRES - Dosed in mg/24 hr) patch 0.2 mg  0.2 mg Transdermal Weekly Lance Coon, MD   0.2 mg at 05/16/17 1010  . doxazosin (CARDURA) tablet 8 mg  8 mg Oral BID Lance Coon, MD   8 mg at 05/19/17 7517  . furosemide (LASIX) tablet  40 mg  40 mg Oral Daily Rise Mu, [place]   Stopped at 05/19/17 0017  . guaiFENesin-dextromethorphan (ROBITUSSIN DM) 100-10 MG/5ML syrup 5 mL  5 mL Oral Q4H PRN Lance Coon, MD   5 mL at 05/17/17 0926  . hydrALAZINE (APRESOLINE) injection 10 mg  10 mg Intravenous Q4H PRN Lance Coon, MD   10 mg at 05/17/17 1323  . insulin aspart (novoLOG) injection 0-5 Units  0-5 Units Subcutaneous QHS Dustin Flock, MD      . insulin aspart (novoLOG) injection 0-9 Units  0-9 Units Subcutaneous TID WC Dustin Flock, MD   7 Units at 05/19/17 1130  . ipratropium-albuterol (DUONEB) 0.5-2.5 (3) MG/3ML nebulizer solution 3 mL  3 mL Nebulization Q4H PRN Lance Coon, MD      . levothyroxine (SYNTHROID, LEVOTHROID) tablet 125 mcg  125 mcg Oral QAC breakfast Dustin Flock, MD   125 mcg at 05/19/17 0754  . mometasone-formoterol (DULERA) 200-5 MCG/ACT inhaler 2 puff  2 puff Inhalation BID Lance Coon, MD   2 puff at 05/19/17 0754  . ondansetron (ZOFRAN) tablet 4 mg  4 mg Oral Q6H PRN Lance Coon, MD   4 mg at 05/18/17 1810   Or  . ondansetron [facility]) injection 4 mg  4 mg Intravenous Q6H PRN Lance Coon, MD   4 mg at 05/19/17 0801  . pantoprazole (PROTONIX) injection 40 mg  40 mg Intravenous Laurence Spates, MD   40 mg at 05/19/17 0929  . potassium chloride SA (K-DUR,KLOR-CON) CR tablet 20 mEq  20 mEq Oral Daily Christell Faith M, PA-C   20 mEq at 05/19/17 4944     Discharge Medications: Please see discharge summary for a list of discharge medications.  Relevant Imaging Results:  Relevant Lab Results:   Additional Information ss: [id number]  Shela Leff, LCSW

## 2017-05-19 NOTE — Clinical Social Work Note (Signed)
Clinical Social Work Assessment  Patient Details  Name: Amy [name] MRN: [id number] Date of Birth: 1928-01-26  Date of referral:  05/19/17               Reason for consult:  Facility Placement                Permission sought to share information with:  Chartered certified accountant granted to share information::  Yes, Verbal Permission Granted  Name::        Agency::     Relationship::     Contact Information:     Housing/Transportation Living arrangements for the past 2 months:  Single Family Home Source of Information:  Patient Patient Interpreter Needed:  None Criminal Activity/Legal Involvement Pertinent to Current Situation/Hospitalization:  No - Comment as needed Significant Relationships:  Adult Children Lives with:  Self Do you feel safe going back to the place where you live?  Yes Need for family participation in patient care:  No (Coment)  Care giving concerns:  Patient resides alone in her home.   Social Worker assessment / plan:  CSW informed by PT that she will recommended home health still however, patient could benefit from STR. CSW spoke with patient this morning and she has stated that if she discharges today or tomorrow, she does not feel like she would be able to manage at home on her own. She stated that she has been to [facility] and would like to go there again but she understands if they cannot offer. CSW to initiate a bed search.  Employment status:  Retired Forensic scientist:  Medicare PT Recommendations:    Information / Referral to community resources:  [place]  Patient/Family's Response to care:  Patient expressed much appreciation for CSW assistance.  Patient/Family's Understanding of and Emotional Response to Diagnosis, Current Treatment, and Prognosis:  Patient understands her limitations.  Emotional Assessment Appearance:  Appears younger than stated age Attitude/Demeanor/Rapport:  (pleasant and  cooperative) Affect (typically observed):  Accepting, Adaptable, Calm Orientation:  Oriented to Self, Oriented to Place, Oriented to  Time, Oriented to Situation Alcohol / Substance use:  Not Applicable Psych involvement (Current and /or in the community):  No (Comment)  Discharge Needs  Concerns to be addressed:  Care Coordination Readmission within the last 30 days:  No Current discharge risk:  None Barriers to Discharge:  No Barriers Identified   Shela Leff, LCSW 05/19/2017, 11:40 AM

## 2017-05-19 NOTE — Progress Notes (Signed)
[place] [facility] [place] NAME: Amy [name]    MR#:  [id number]  DATE OF BIRTH:  03-02-1927  SUBJECTIVE:  And very weak shortness of breath improved  REVIEW OF SYSTEMS:    Review of Systems  Constitutional: Negative for fever, chills weight loss HENT: Negative for ear pain, nosebleeds, congestion, facial swelling, rhinorrhea, neck pain, neck stiffness and ear discharge.   Respiratory: Cough resolved, positive shortness of breath, NO wheezing  Cardiovascular: Negative for chest pain, palpitations and ++ LEE.  Gastrointestinal: Negative for heartburn, abdominal pain, vomiting, diarrhea or consitpation Genitourinary: Negative for dysuria, urgency, frequency, hematuria Musculoskeletal: Negative for back pain or joint pain Neurological: Negative for dizziness, seizures, syncope, focal weakness,  numbness and headaches.  Hematological: Does not bruise/bleed easily.  Psychiatric/Behavioral: Negative for hallucinations, confusion, dysphoric mood    Tolerating Diet:yes      DRUG ALLERGIES:   Allergies  Allergen Reactions  . Losartan Other (See Comments)    Hyperkalemia  . Morphine And Related Shortness Of Breath    Rash, difficulty breathing, nausea.   . Atenolol Other (See Comments)    Other reaction(s): Other (See Comments) Decreased heart rate Decreased heart rate  . Codeine     Rash, difficulty breathing, nausea.  . Nsaids Other (See Comments)    Other reaction(s): Unknown  . Rofecoxib     Other reaction(s): Unknown  . Sucralfate Other (See Comments)    Throat tightness  . Sulfa Antibiotics     Other reaction(s): Unknown    VITALS:  Blood pressure (!) 163/58, pulse 96, temperature 97.7 F (36.5 C), temperature source Oral, resp. rate 20, height 5\' 2"  (1.575 m), weight 207 lb 14.3 oz (94.3 kg), SpO2 94 %.  PHYSICAL EXAMINATION:  Constitutional: Appears well-developed and well-nourished. No distress. HENT: Normocephalic.  Marland Kitchen Oropharynx is clear and moist.  Eyes: Conjunctivae and EOM are normal. PERRLA, no scleral icterus.  Neck: Normal ROM. Neck supple. No JVD. No tracheal deviation. CVS: RRR, S1/S2 +, no murmurs, no gallops, no carotid bruit.  Pulmonary: Effort and breath sounds normal, no stridor, rhonchi, wheezes, rales.  Abdominal: Soft. BS +,  no distension, tenderness, rebound or guarding.  Musculoskeletal: Normal range of motion. 1+ LEE and no tenderness.  Neuro: Alert. CN 2-12 grossly intact. No focal deficits. Skin: Skin is warm and dry. No rash noted. Psychiatric: Normal mood and affect.      LABORATORY PANEL:   CBC Recent Labs  Lab 05/19/17 0556  WBC 8.8  HGB 8.6*  HCT 26.6*  PLT 260   ------------------------------------------------------------------------------------------------------------------  Chemistries  Recent Labs  Lab 05/15/17 2134  05/19/17 0556  NA  --    < > 141  K  --    < > 4.0  CL  --    < > 106  CO2  --    < > 26  GLUCOSE  --    < > 139*  BUN  --    < > 19  CREATININE  --    < > 1.36*  CALCIUM  --    < > 7.9*  AST 26  --   --   ALT 11*  --   --   ALKPHOS 70  --   --   BILITOT 0.5  --   --    < > = values in this interval not displayed.   ------------------------------------------------------------------------------------------------------------------  Cardiac Enzymes Recent Labs  Lab 05/15/17 1550  TROPONINI <0.03   ------------------------------------------------------------------------------------------------------------------  RADIOLOGY:  Dg Chest 1 View  Result Date: 05/18/2017 CLINICAL DATA:  Status post left thoracentesis EXAM: CHEST  1 VIEW COMPARISON:  05/18/2017 FINDINGS: Cardiac shadow remains enlarged. Previously seen left pleural effusion has been removed. No pneumothorax is noted following thoracentesis. Some patchy atelectatic changes are noted in both lungs. No bony abnormality is seen. IMPRESSION: No pneumothorax following  thoracentesis on the left. Electronically Signed   By: Inez Catalina M.D.   On: 05/18/2017 15:47   Dg Chest 1 View  Result Date: 05/18/2017 CLINICAL DATA:  Congestive heart failure. EXAM: CHEST  1 VIEW COMPARISON:  Radiograph of May 15, 2017. FINDINGS: Stable cardiomegaly. Atherosclerosis of thoracic aorta is noted. No pneumothorax is noted. Interval development of moderate left pleural effusion with associated atelectasis or infiltrate. Increased interstitial densities are noted in the right lung base concerning for worsening edema or atelectasis. Bony thorax is unremarkable. IMPRESSION: Interval development of moderate left pleural effusion with associated atelectasis or infiltrate. Increased right basilar edema or atelectasis is noted. Aortic Atherosclerosis (ICD10-I70.0). Electronically Signed   By: Marijo Conception, M.D.   On: 05/18/2017 07:20   US Thoracentesis Asp Pleural Space W/img Guide  Result Date: 05/18/2017 INDICATION: Left pleural effusion EXAM: ULTRASOUND GUIDED LEFT THORACENTESIS MEDICATIONS: None. COMPLICATIONS: None immediate. PROCEDURE: An ultrasound guided thoracentesis was thoroughly discussed with the patient and questions answered. The benefits, risks, alternatives and complications were also discussed. The patient understands and wishes to proceed with the procedure. Written consent was obtained. Ultrasound was performed to localize and mark an adequate pocket of fluid in the left chest. The area was then prepped and draped in the normal sterile fashion. 1% Lidocaine was used for local anesthesia. Under ultrasound guidance a 6 Fr Safe-T-Centesis catheter was introduced. Thoracentesis was performed. The catheter was removed and a dressing applied. FINDINGS: A total of approximately 600 mL of clear yellow fluid was removed. Samples were sent to the laboratory as requested by the clinical team. IMPRESSION: Successful ultrasound guided left thoracentesis yielding 600 mL of pleural fluid.  Electronically Signed   By: Inez Catalina M.D.   On: 05/18/2017 15:45     ASSESSMENT AND PLAN:   84 year old female with chronic kidney disease stage III, diabetes, morbid obesity and chronic diastolic heart failure with preserved ejection fraction who presents with progressive shortness of breath and melena.  1.  Shortness of breath: This is multifactorial due to community-acquired pneumonia as well as acute on chronic diastolic heart failure in addition to acute blood loss anemia Status post thoracentesis respiratory status improved  2. CAP: Continue Rocephin stop azithromycin   3.  Acute on chronic diastolic heart failure with preserved ejection fraction: Oral Lasix Monitor intake and output with daily weight CHF clinic upon discharge [facility] cardiology follow-up upon discharge   4.  Acute on chronic iron deficiency anemia: One-time dose of venofer ordered today due to low iron on anemai panel Status post transfusion  5.  Melena: No further episodes was seen by GI Continue PPIs Follow hemoglobin No plan for endoscopy due to her respiratory status  6.  PAF: Patient is in normal sinus rhythm Heart rate controlled   7.  CAD status post LAD stent in 2011: As per cardiology holding Plavix for EGD is reasonable.   Continue statin  8.  Essential hypertension: Continue clonidine and Cardura  9.  Diabetes: On sliding scale  10.  Hypothyroidism: Continue Synthroid  Physical therapy is recommending home health upon discharge  Management plans discussed with  the patient and she is in agreement.  CODE STATUS: DNR  TOTAL TIME TAKING CARE OF THIS PATIENT: 30 minutes.     POSSIBLE D/C tuesday, DEPENDING ON CLINICAL CONDITION.   Dustin Flock M.D on 05/19/2017 at 4:04 PM  Between 7am to 6pm - Pager - [phone] After 6pm go to [email or web address] - password [email or web address] [place] [facility]  Office  [phone]  CC: Primary care physician; Leonel Ramsay,  MD  Note: This dictation was prepared with Dragon dictation along with smaller phrase technology. Any transcriptional errors that result from this process are unintentional.

## 2017-05-19 NOTE — Progress Notes (Signed)
Vonda Antigua, MD [address] [place], [place], [place], [address] [address], [place], [place], [place], [address] Phone: [phone]  Fax: [phone]   Subjective: Patient sitting up in chair comfortably.  Underwent 6 mm of thoracentesis yesterday.   Objective: Vital signs in last 24 hours: Vitals:   05/19/17 0427 05/19/17 0753 05/19/17 1032 05/19/17 1255  BP: (!) 163/39 (!) 178/33 (!) 168/40 (!) 163/58  Pulse: 65 66 84 96  Resp: 20 19  20   Temp: 98.5 F (36.9 C) 98.1 F (36.7 C)  97.7 F (36.5 C)  TempSrc:    Oral  SpO2: 97% 96% 96% 94%  Weight:      Height:       Weight change: -9 lb 7.7 oz (-4.3 kg)  Intake/Output Summary (Last 24 hours) at 05/19/2017 1417 Last data filed at 05/19/2017 1121 Gross per 24 hour  Intake 47338.79 ml  Output 1880 ml  Net 45458.79 ml     Exam: Pulm: CTA b/l, Normal Resp Effort Abd: Soft, NT/ND, No HSM Skin: Warm, no rashes Neck: Supple, Trachea midline   Lab Results: Lab Results  Component Value Date   WBC 8.8 05/19/2017   HGB 8.6 (L) 05/19/2017   HCT 26.6 (L) 05/19/2017   MCV 88.8 05/19/2017   PLT 260 05/19/2017   Micro Results: Recent Results (from the past 240 hour(s))  Blood culture (routine x 2)     Status: None (Preliminary result)   Collection Time: 05/15/17  9:34 PM  Result Value Ref Range Status   Specimen Description BLOOD LEFT FOREARM  Final   Special Requests   Final    BOTTLES DRAWN AEROBIC AND ANAEROBIC Blood Culture adequate volume   Culture   Final    NO GROWTH 4 DAYS Performed at [facility], [address] [place], [place] [address]    Report Status PENDING  Incomplete  Blood culture (routine x 2)     Status: None (Preliminary result)   Collection Time: 05/15/17  9:34 PM  Result Value Ref Range Status   Specimen Description BLOOD RIGHT ANTECUBITAL  Final   Special Requests   Final    BOTTLES DRAWN AEROBIC AND ANAEROBIC Blood Culture adequate volume   Culture   Final      NO GROWTH 4 DAYS Performed at [facility], [address] [place], [place] [address]    Report Status PENDING  Incomplete  Body fluid culture     Status: None (Preliminary result)   Collection Time: 05/18/17  3:15 PM  Result Value Ref Range Status   Specimen Description   Final    PLEURAL Performed at [facility], [address] [place], [place] [address]    Special Requests   Final    NONE Performed at [facility], [address] [place], [place] [address]    Gram Stain   Final    RARE WBC PRESENT, PREDOMINANTLY PMN NO ORGANISMS SEEN    Culture   Final    NO GROWTH < 12 HOURS Performed at [facility] [address], [place],  [address]    Report Status PENDING  Incomplete   Studies/Results: Dg Chest 1 View  Result Date: 05/18/2017 CLINICAL DATA:  Status post left thoracentesis EXAM: CHEST  1 VIEW COMPARISON:  05/18/2017 FINDINGS: Cardiac shadow remains enlarged. Previously seen left pleural effusion has been removed. No pneumothorax is noted following thoracentesis. Some patchy atelectatic changes are noted in both lungs. No bony  abnormality is seen. IMPRESSION: No pneumothorax following thoracentesis on the left. Electronically Signed   By: Inez Catalina M.D.   On: 05/18/2017 15:47   Dg Chest 1 View  Result Date: 05/18/2017 CLINICAL DATA:  Congestive heart failure. EXAM: CHEST  1 VIEW COMPARISON:  Radiograph of May 15, 2017. FINDINGS: Stable cardiomegaly. Atherosclerosis of thoracic aorta is noted. No pneumothorax is noted. Interval development of moderate left pleural effusion with associated atelectasis or infiltrate. Increased interstitial densities are noted in the right lung base concerning for worsening edema or atelectasis. Bony thorax is unremarkable. IMPRESSION: Interval development of moderate left pleural effusion with associated atelectasis or infiltrate. Increased right basilar edema or atelectasis is noted. Aortic  Atherosclerosis (ICD10-I70.0). Electronically Signed   By: Marijo Conception, M.D.   On: 05/18/2017 07:20   US Thoracentesis Asp Pleural Space W/img Guide  Result Date: 05/18/2017 INDICATION: Left pleural effusion EXAM: ULTRASOUND GUIDED LEFT THORACENTESIS MEDICATIONS: None. COMPLICATIONS: None immediate. PROCEDURE: An ultrasound guided thoracentesis was thoroughly discussed with the patient and questions answered. The benefits, risks, alternatives and complications were also discussed. The patient understands and wishes to proceed with the procedure. Written consent was obtained. Ultrasound was performed to localize and mark an adequate pocket of fluid in the left chest. The area was then prepped and draped in the normal sterile fashion. 1% Lidocaine was used for local anesthesia. Under ultrasound guidance a 6 Fr Safe-T-Centesis catheter was introduced. Thoracentesis was performed. The catheter was removed and a dressing applied. FINDINGS: A total of approximately 600 mL of clear yellow fluid was removed. Samples were sent to the laboratory as requested by the clinical team. IMPRESSION: Successful ultrasound guided left thoracentesis yielding 600 mL of pleural fluid. Electronically Signed   By: Inez Catalina M.D.   On: 05/18/2017 15:45   Medications:  Scheduled Meds: . amLODipine  10 mg Oral Daily  . atorvastatin  20 mg Oral q1800  . azithromycin  500 mg Oral Daily  . cloNIDine  0.2 mg Transdermal Weekly  . doxazosin  8 mg Oral BID  . furosemide  40 mg Oral Daily  . insulin aspart  0-5 Units Subcutaneous QHS  . insulin aspart  0-9 Units Subcutaneous TID WC  . levothyroxine  125 mcg Oral QAC breakfast  . mometasone-formoterol  2 puff Inhalation BID  . pantoprazole  40 mg Intravenous Q12H  . potassium chloride  20 mEq Oral Daily   Continuous Infusions: . cefTRIAXone (ROCEPHIN) IVPB 1 gram/100 mL NS (Mini-Bag Plus) Stopped (05/18/17 1900)   PRN Meds:.acetaminophen **OR** acetaminophen,  benzonatate, guaiFENesin-dextromethorphan, hydrALAZINE, ipratropium-albuterol, ondansetron **OR** ondansetron (ZOFRAN) IV   Assessment: Principal Problem:   Sepsis ([place]) Active Problems:   Hyperlipidemia   Essential hypertension   CAD, NATIVE VESSEL   Chronic diastolic CHF (congestive heart failure) (HCC)   Diabetes type 2, uncontrolled ([place])   Acute renal failure superimposed on stage 3 chronic kidney disease (HCC)   GI bleed   CAP (community acquired pneumonia)    Plan: We will plan on EGD tomorrow to evaluate source of anemia and melena Hemoglobin has been stable over 24-48 hours without any need for transfusions Bowel movements are now brown in color Breathing is better since thoracentesis, cardiology has evaluated the patient was well, and is managing her diuretics Continue serial CBCs and transfuse PRN Continue PPI twice daily no evidence of lower GI bleeding present  EGD will help rule out any upper GI lesions before restarting Plavix if that is still needed  No  evidence of lower GI bleeding present   LOS: 3 days   Vonda Antigua, MD 05/19/2017, 2:17 PM

## 2017-05-19 NOTE — Progress Notes (Signed)
Progress Note  Patient Name: Amy [name] Date of Encounter: 05/19/2017  Primary Cardiologist: Rockey Situ  Subjective   SOB improved s/p thoracentesis with 600 mL fluid aspirated. No chest pain. No further melena. No BRBPR. SCr stable. HGB low, though stable. Remains on IV Lasix 20 mg bid. UOP and weight possibly incorrect.   Inpatient Medications    Scheduled Meds: . amLODipine  10 mg Oral Daily  . atorvastatin  20 mg Oral q1800  . azithromycin  500 mg Oral Daily  . cloNIDine  0.2 mg Transdermal Weekly  . doxazosin  8 mg Oral BID  . furosemide  40 mg Oral Daily  . insulin aspart  0-5 Units Subcutaneous QHS  . insulin aspart  0-9 Units Subcutaneous TID WC  . levothyroxine  125 mcg Oral QAC breakfast  . mometasone-formoterol  2 puff Inhalation BID  . pantoprazole  40 mg Intravenous Q12H  . potassium chloride  20 mEq Oral Daily   Continuous Infusions: . cefTRIAXone (ROCEPHIN) IVPB 1 gram/100 mL NS (Mini-Bag Plus) Stopped (05/18/17 1900)   PRN Meds: acetaminophen **OR** acetaminophen, benzonatate, guaiFENesin-dextromethorphan, hydrALAZINE, ipratropium-albuterol, ondansetron **OR** ondansetron (ZOFRAN) IV   Vital Signs    Vitals:   05/18/17 2315 05/19/17 0222 05/19/17 0427 05/19/17 0753  BP: (!) 137/28  (!) 163/39 (!) 178/33  Pulse: 79  65 66  Resp:   20 19  Temp:   98.5 F (36.9 C) 98.1 F (36.7 C)  TempSrc:      SpO2:   97% 96%  Weight:  207 lb 14.3 oz (94.3 kg)    Height:        Intake/Output Summary (Last 24 hours) at 05/19/2017 0929 Last data filed at 05/19/2017 4196 Gross per 24 hour  Intake 47098.79 ml  Output 1330 ml  Net 45768.79 ml   Filed Weights   05/17/17 0431 05/18/17 0532 05/19/17 0222  Weight: 211 lb 6.4 oz (95.9 kg) 217 lb 6 oz (98.6 kg) 207 lb 14.3 oz (94.3 kg)    Telemetry    Not on telemetry - Personally Reviewed  ECG    n/a - Personally Reviewed  Physical Exam   GEN: Frail and elderly appearing; No acute distress.   Neck:  No JVD. Cardiac: RRR, I/VI systolic murmur, no rubs, or gallops. Bandage noted at left thoracentesis site on posterior chest.  Respiratory: Clear to auscultation bilaterally.  GI: Soft, nontender, non-distended.   MS: No edema; No deformity. Neuro:  Alert and oriented x 3; Nonfocal.  Psych: Normal affect.  Labs    Chemistry Recent Labs  Lab 05/15/17 2134  05/17/17 0607 05/18/17 0457 05/19/17 0556  NA  --    < > 141 143 141  K  --    < > 4.8 4.2 4.0  CL  --    < > 111 111 106  CO2  --    < > 23 25 26   GLUCOSE  --    < > 127* 136* 139*  BUN  --    < > 31* 21* 19  CREATININE  --    < > 1.41* 1.30* 1.36*  CALCIUM  --    < > 7.7* 7.9* 7.9*  PROT 5.9*  --   --   --   --   ALBUMIN 2.9*  --   --   --   --   AST 26  --   --   --   --   ALT 11*  --   --   --   --  ALKPHOS 70  --   --   --   --   BILITOT 0.5  --   --   --   --   GFRNONAA  --    < > 32* 36* 34*  GFRAA  --    < > 37* 41* 39*  ANIONGAP  --    < > 7 7 9    < > = values in this interval not displayed.     Hematology Recent Labs  Lab 05/17/17 0607 05/17/17 2243 05/18/17 0457 05/19/17 0556  WBC 8.6  --  10.9 8.8  RBC 2.55*  --  2.88* 3.00*  HGB 7.5* 9.2* 8.5* 8.6*  HCT 23.2* 28.5* 25.7* 26.6*  MCV 90.9  --  89.5 88.8  MCH 29.6  --  29.6 28.7  MCHC 32.6  --  33.1 32.4  RDW 15.2*  --  15.0* 15.3*  PLT 239  --  258 260    Cardiac Enzymes Recent Labs  Lab 05/15/17 1550  TROPONINI <0.03   No results for input(s): TROPIPOC in the last 168 hours.   BNPNo results for input(s): BNP, PROBNP in the last 168 hours.   DDimer No results for input(s): DDIMER in the last 168 hours.   Radiology    Dg Chest 1 View  Result Date: 05/18/2017 IMPRESSION: No pneumothorax following thoracentesis on the left. Electronically Signed   By: Inez Catalina M.D.   On: 05/18/2017 15:47   Dg Chest 1 View  Result Date: 05/18/2017 IMPRESSION: Interval development of moderate left pleural effusion with associated atelectasis or  infiltrate. Increased right basilar edema or atelectasis is noted. Aortic Atherosclerosis (ICD10-I70.0). Electronically Signed   By: Marijo Conception, M.D.   On: 05/18/2017 07:20   US Thoracentesis Asp Pleural Space W/img Guide  Result Date: 05/18/2017 IMPRESSION: Successful ultrasound guided left thoracentesis yielding 600 mL of pleural fluid. Electronically Signed   By: Inez Catalina M.D.   On: 05/18/2017 15:45    Cardiac Studies   TTE 09/2016: Study Conclusions  - Left ventricle: The cavity size was normal. Wall thickness was   increased in a pattern of moderate LVH. Systolic function was   vigorous. The estimated ejection fraction was in the range of 65%   to 70%. Features are consistent with a pseudonormal left   ventricular filling pattern, with concomitant abnormal relaxation   and increased filling pressure (grade 2 diastolic dysfunction).   Doppler parameters are consistent with high ventricular filling   pressure. - Mitral valve: Calcified annulus. - Left atrium: The atrium was mildly dilated. - Right ventricle: The cavity size was mildly dilated. Systolic   function was normal. - Right atrium: The atrium was mildly dilated. - Pericardium, extracardiac: A small pericardial effusion was   identified.  Patient Profile     84 y.o. female with history of CAD (s/p stenting of LAD in 2011), carotid artery disease, chronic diastolic CHF, PAF (remote occurrence, not on anticoagulation), HTN, HLD, Type 2 DM, Stage 3 CKD and morbid obesity who presented to [facility] on [facility]/15/2017 for evaluation of worsening dyspnea and productive cough after being diagnosed with PNA by her PCP. Also found to have worsening anemia and melena. Cardiology consulted due to the need to hold Plavix for EGD per GI request.  Assessment & Plan    1. Pleural effusion/acute on chronic diastolic CHF: -Status post left-sided thoracentesis with 600 mL aspirated -SOB improved -Transition from IV Lasix to PO Lasix 40 mg  daily with KCL  repletion -Recent echo as above, no need to repeat at this time  2. GI bleed: -No further melena -DAPT held upon admission -GI without plans for EGD at this time given #1 -Resume ASA when ok per GI -No recent stenting, can avoid further Plavix at this time  3. CAD: -S/p stenting of LAD in 2011 with recent NST in 03/2017 showing no significant ischemia and overall being a low-risk study -Resume ASA as above when able from GI perspective  -No plans for inpatient ischemic evaluation  4. Labile HTN: -Continue to monitor -Per IM  5. PNA: -Per IM  6. Anemia/melena: -HGB low, though stable -Maintain HGB > 8.5 -Per IM -GI on board   For questions or updates, please contact [place] Please consult [email or web address] for contact info under Cardiology/STEMI.    Signed, Christell Faith, PA-C [facility] Pager: [phone] 05/19/2017, 9:29 AM

## 2017-05-20 ENCOUNTER — Inpatient Hospital Stay: Payer: Medicare Other | Admitting: Anesthesiology

## 2017-05-20 LAB — CBC
HEMATOCRIT: 25.7 % — AB (ref 35.0–47.0)
HEMOGLOBIN: 8.5 g/dL — AB (ref 12.0–16.0)
MCH: 29.5 pg (ref 26.0–34.0)
MCHC: 33 g/dL (ref 32.0–36.0)
MCV: 89.5 fL (ref 80.0–100.0)
PLATELETS: 276 10*3/uL (ref 150–440)
RBC: 2.88 MIL/uL — AB (ref 3.80–5.20)
RDW: 15.4 % — ABNORMAL HIGH (ref 11.5–14.5)
WBC: 8.7 10*3/uL (ref 3.6–11.0)

## 2017-05-20 LAB — BASIC METABOLIC PANEL
ANION GAP: 11 (ref 5–15)
BUN: 18 mg/dL (ref 6–20)
CO2: 26 mmol/L (ref 22–32)
Calcium: 7.8 mg/dL — ABNORMAL LOW (ref 8.9–10.3)
Chloride: 103 mmol/L (ref 101–111)
Creatinine, Ser: 1.32 mg/dL — ABNORMAL HIGH (ref 0.44–1.00)
GFR calc non Af Amer: 35 mL/min — ABNORMAL LOW (ref >60)
GFR, EST AFRICAN AMERICAN: 40 mL/min — AB (ref >60)
Glucose, Bld: 146 mg/dL — ABNORMAL HIGH (ref 65–99)
POTASSIUM: 4.1 mmol/L (ref 3.5–5.1)
SODIUM: 140 mmol/L (ref 135–145)

## 2017-05-20 LAB — GLUCOSE, CAPILLARY
GLUCOSE-CAPILLARY: 141 mg/dL — AB (ref 65–99)
Glucose-Capillary: 234 mg/dL — ABNORMAL HIGH (ref 65–99)

## 2017-05-20 LAB — CULTURE, BLOOD (ROUTINE X 2)
CULTURE: NO GROWTH
Culture: NO GROWTH
Special Requests: ADEQUATE
Special Requests: ADEQUATE

## 2017-05-20 MED ORDER — GUAIFENESIN-DM 100-10 MG/5ML PO SYRP: 5.0000 mL | ORAL_SOLUTION | ORAL | 0 refills | Status: DC | PRN

## 2017-05-20 MED ORDER — FUROSEMIDE 40 MG PO TABS: 40.0000 mg | ORAL_TABLET | Freq: Every day | ORAL | 2 refills | Status: DC

## 2017-05-20 MED ORDER — CEFUROXIME AXETIL 500 MG PO TABS: 500.0000 mg | ORAL_TABLET | Freq: Two times a day (BID) | ORAL | 0 refills | Status: AC

## 2017-05-20 MED ORDER — PROPOFOL 500 MG/50ML IV EMUL
INTRAVENOUS | Status: AC
  Filled 2017-05-20: qty 50

## 2017-05-20 MED ORDER — PANTOPRAZOLE SODIUM 40 MG PO TBEC: 40.0000 mg | DELAYED_RELEASE_TABLET | Freq: Every day | ORAL | 1 refills | Status: DC

## 2017-05-20 MED ORDER — PREDNISONE 10 MG (21) PO TBPK: ORAL_TABLET | ORAL | 0 refills | Status: DC

## 2017-05-20 MED ORDER — PANTOPRAZOLE SODIUM 40 MG PO TBEC
40.0000 mg | DELAYED_RELEASE_TABLET | Freq: Every day | ORAL | Status: DC
  Administered 2017-05-20: 40 mg via ORAL
  Filled 2017-05-20: qty 1

## 2017-05-20 MED ORDER — SODIUM CHLORIDE 0.9 % IV SOLN
INTRAVENOUS | Status: DC
  Administered 2017-05-20: 06:00:00 via INTRAVENOUS

## 2017-05-20 MED ORDER — LIDOCAINE HCL (PF) 2 % IJ SOLN
INTRAMUSCULAR | Status: AC
  Filled 2017-05-20: qty 10

## 2017-05-20 MED ORDER — BENZONATATE 200 MG PO CAPS: 200.0000 mg | ORAL_CAPSULE | Freq: Three times a day (TID) | ORAL | 0 refills | Status: DC | PRN

## 2017-05-20 NOTE — Progress Notes (Signed)
Pharmacy Antibiotic Note  Amy [name] is a 84 y.o. female admitted on 05/15/2017 with pneumonia.  Pharmacy has been consulted for ceftriaxone dosing.  Plan: Day 5 of Abx therapy. Continue Ceftriaxone 1 gram q 24 hours ordered.  Height: 5\' 2"  (157.5 cm) Weight: 204 lb 12.9 oz (92.9 kg) IBW/kg (Calculated) : 50.1  Temp (24hrs), Avg:98.4 F (36.9 C), Min:97.7 F (36.5 C), Max:99.8 F (37.7 C)  Recent Labs  Lab 05/15/17 2134 05/15/17 2357 05/16/17 0145 05/16/17 0738 05/17/17 0607 05/18/17 0457 05/19/17 0556 05/20/17 0431  WBC  --   --   --  7.8 8.6 10.9 8.8 8.7  CREATININE  --   --   --  1.45* 1.41* 1.30* 1.36* 1.32*  LATICACIDVEN 2.3* 2.3* 1.5  --   --   --   --   --     Estimated Creatinine Clearance: 31.3 mL/min (A) (by C-G formula based on SCr of 1.32 mg/dL (H)).    Allergies  Allergen Reactions  . Losartan Other (See Comments)    Hyperkalemia  . Morphine And Related Shortness Of Breath    Rash, difficulty breathing, nausea.   . Atenolol Other (See Comments)    Other reaction(s): Other (See Comments) Decreased heart rate Decreased heart rate  . Codeine     Rash, difficulty breathing, nausea.  . Nsaids Other (See Comments)    Other reaction(s): Unknown  . Rofecoxib     Other reaction(s): Unknown  . Sucralfate Other (See Comments)    Throat tightness  . Sulfa Antibiotics     Other reaction(s): Unknown    Antimicrobials this admission: Azithromycin, ceftriaxone 3/22  >>    >>   Dose adjustments this admission:   Microbiology results: 3/22 BCx: pending      3/22 CXR: patchy airspace disease throughout both lungs Thank you for allowing pharmacy to be a part of this patient's care.  Amy [name] 05/20/2017 10:11 AM

## 2017-05-20 NOTE — Progress Notes (Signed)
Amy Antigua, MD [address] [place], [place], [place], [address] [address], [place], [place], [place], [address] Phone: [phone]  Fax: [phone]   Subjective: Patient is nurse reports brown stools.  No melena.  No hematemesis.  Shortness of breath remains the same.  Patient requiring supplemental oxygen   Objective: Vital signs in last 24 hours: Vitals:   05/19/17 1255 05/19/17 2026 05/20/17 0500 05/20/17 0649  BP: (!) 163/58 (!) 157/47  (!) 162/49  Pulse: 96 81  83  Resp: 20 20  20   Temp: 97.7 F (36.5 C) 99.8 F (37.7 C)  97.7 F (36.5 C)  TempSrc: Oral Oral  Oral  SpO2: 94% 94%  94%  Weight:   204 lb 12.9 oz (92.9 kg)   Height:       Weight change: -3 lb 1.4 oz (-1.4 kg)  Intake/Output Summary (Last 24 hours) at 05/20/2017 0757 Last data filed at 05/20/2017 9622 Gross per 24 hour  Intake 690 ml  Output 1200 ml  Net -510 ml     Exam:  Pulm: CTA b/l, Normal Resp Effort Abd: Soft, NT/ND, No HSM Skin: Warm, no rashes Neck: Supple, Trachea midline   Lab Results: Lab Results  Component Value Date   WBC 8.7 05/20/2017   HGB 8.5 (L) 05/20/2017   HCT 25.7 (L) 05/20/2017   MCV 89.5 05/20/2017   PLT 276 05/20/2017   Micro Results: Recent Results (from the past 240 hour(s))  Blood culture (routine x 2)     Status: None   Collection Time: 05/15/17  9:34 PM  Result Value Ref Range Status   Specimen Description BLOOD LEFT FOREARM  Final   Special Requests   Final    BOTTLES DRAWN AEROBIC AND ANAEROBIC Blood Culture adequate volume   Culture   Final    NO GROWTH 5 DAYS Performed at [facility], [address] [place], [place] [address]    Report Status 05/20/2017 FINAL  Final  Blood culture (routine x 2)     Status: None   Collection Time: 05/15/17  9:34 PM  Result Value Ref Range Status   Specimen Description BLOOD RIGHT ANTECUBITAL  Final   Special Requests   Final    BOTTLES DRAWN AEROBIC AND ANAEROBIC Blood Culture  adequate volume   Culture   Final    NO GROWTH 5 DAYS Performed at [facility], [address] [place], [place] [address]    Report Status 05/20/2017 FINAL  Final  Body fluid culture     Status: None (Preliminary result)   Collection Time: 05/18/17  3:15 PM  Result Value Ref Range Status   Specimen Description   Final    PLEURAL Performed at [facility], [address] [place], [place] [address]    Special Requests   Final    NONE Performed at [facility], [address] [place], [place] [address]    Gram Stain   Final    RARE WBC PRESENT, PREDOMINANTLY PMN NO ORGANISMS SEEN    Culture   Final    NO GROWTH < 12 HOURS Performed at [facility], [place] [address], [place], [place] [address]    Report Status PENDING  Incomplete   Studies/Results: Dg Chest 1 View  Result Date: 05/18/2017 CLINICAL DATA:  Status post left thoracentesis EXAM: CHEST  1 VIEW COMPARISON:  05/18/2017 FINDINGS: Cardiac shadow remains enlarged. Previously seen left pleural effusion has been removed. No pneumothorax is noted  following thoracentesis. Some patchy atelectatic changes are noted in both lungs. No bony abnormality is seen. IMPRESSION: No pneumothorax following thoracentesis on the left. Electronically Signed   By: Inez Catalina M.D.   On: 05/18/2017 15:47   US Thoracentesis Asp Pleural Space W/img Guide  Result Date: 05/18/2017 INDICATION: Left pleural effusion EXAM: ULTRASOUND GUIDED LEFT THORACENTESIS MEDICATIONS: None. COMPLICATIONS: None immediate. PROCEDURE: An ultrasound guided thoracentesis was thoroughly discussed with the patient and questions answered. The benefits, risks, alternatives and complications were also discussed. The patient understands and wishes to proceed with the procedure. Written consent was obtained. Ultrasound was performed to localize and mark an adequate pocket of fluid in the left chest. The area was then prepped and draped in the  normal sterile fashion. 1% Lidocaine was used for local anesthesia. Under ultrasound guidance a 6 Fr Safe-T-Centesis catheter was introduced. Thoracentesis was performed. The catheter was removed and a dressing applied. FINDINGS: A total of approximately 600 mL of clear yellow fluid was removed. Samples were sent to the laboratory as requested by the clinical team. IMPRESSION: Successful ultrasound guided left thoracentesis yielding 600 mL of pleural fluid. Electronically Signed   By: Inez Catalina M.D.   On: 05/18/2017 15:45   Medications:  Scheduled Meds: . amLODipine  10 mg Oral Daily  . atorvastatin  20 mg Oral q1800  . cloNIDine  0.2 mg Transdermal Weekly  . doxazosin  8 mg Oral BID  . furosemide  40 mg Oral Daily  . insulin aspart  0-5 Units Subcutaneous QHS  . insulin aspart  0-9 Units Subcutaneous TID WC  . levothyroxine  125 mcg Oral QAC breakfast  . mometasone-formoterol  2 puff Inhalation BID  . pantoprazole  40 mg Intravenous Q12H  . potassium chloride  20 mEq Oral Daily   Continuous Infusions: . sodium chloride 20 mL/hr at 05/20/17 0613  . cefTRIAXone (ROCEPHIN) IVPB 1 gram/100 mL NS (Mini-Bag Plus) Stopped (05/19/17 1736)   PRN Meds:.acetaminophen **OR** acetaminophen, benzonatate, guaiFENesin-dextromethorphan, hydrALAZINE, ipratropium-albuterol, ondansetron **OR** ondansetron (ZOFRAN) IV   Assessment: Principal Problem:   Sepsis ([place]) Active Problems:   Hyperlipidemia   Essential hypertension   CAD, NATIVE VESSEL   Chronic diastolic CHF (congestive heart failure) (HCC)   Diabetes type 2, uncontrolled (HCC)   Acute renal failure superimposed on stage 3 chronic kidney disease (HCC)   GI bleed   CAP (community acquired pneumonia)    Plan: Anesthesia staff, Dr. Theodore Demark, evaluated the patient's chart and determined that patient is high risk for sedation given her shortness of breath, community-acquired pneumonia, pleural effusion requiring thoracentesis, and  requirement of supplemental oxygen. Since she has not had any active bleeding recently. He does not recommend proceeding with endoscopy today, as he thinks there is a good chance the procedure will have to be stopped in the middle due to hypoxia. He recommends waiting till breathing improves.  Diet can be restarted - patient's nurse was informed. NPO past midnight, reevaluation again every day to determine best time to proceed with EGD.  No active GI bleeding, nurse reports brown stool.  Continue PPI BID Continue Serial CBCs and transfuse PRN When she is medically optimized and breathing is improved, EGD can be considered at that time The EGD is to evaluate any large lesions that could rebleed if her Plavix is resumed.  If Plavix does not need to be resumed long-term (to be determined by cardiology), then patient can possibly be managed conservatively, without procedures, and with just PPI twice daily for  6-8 weeks.   LOS: 4 days   Amy Antigua, MD 05/20/2017, 7:57 AM

## 2017-05-20 NOTE — Care Management Important Message (Signed)
Important Message  Patient Details  Name: Amy [name] MRN: [id number] Date of Birth: [birth year]   Medicare Important Message Given:  Yes    Beverly Sessions, RN 05/20/2017, 11:45 AM

## 2017-05-20 NOTE — Clinical Social Work Note (Signed)
CSW extended bed offers to patient and her daughter. After much discussion, patient and daughter have decided that patient can return home with [facility]. MD and RN CM updated. Shela Leff MSW,LCSW [phone]

## 2017-05-20 NOTE — Discharge Summary (Signed)
[facility] [place] at [place], 84 y.o., DOB 08-05-1927, MRN [id number]. Admission date: 05/15/2017 Discharge Date 05/20/2017 Primary MD Leonel Ramsay, MD Admitting Physician Lance Coon, MD  Admission Diagnosis  UGIB (upper gastrointestinal bleed) [K92.2] Hemoptysis [R04.2] Sepsis, due to unspecified organism [facility]) [A41.9] Community acquired pneumonia, unspecified laterality [J18.9]  Discharge Diagnosis   Principal Problem: Sepsis due to community-acquired pneumonia Acute on chronic diastolic CHF Acute on chronic iron deficiency anemia Melena PAF Coronary artery disease status post stent Essential hypertension Diabetes type 2 Hypothyroidism      Hospital Course 84 year old female with chronic kidney disease stage III, diabetes, morbid obesity and chronic diastolic heart failure with preserved ejection fraction who presents with progressive shortness of breath and melena.  Patient was noted to have pneumonia and was admitted for treatment for that.  She was treated with antibiotics.  She also was noted to have pleural effusions felt to be due to combination of CHF and parapneumonic.  Patient's respiratory status is much improved. She will continue oral Ceftin for 3 more days. Patient also was noted to have melena.  Her aspirin and Plavix were held.  She was seen by GI due to her pulmonary issues no endoscopy was performed.  Patient's melena has stopped.  She did receive 1 unit of packed RBCs.  Her primary care provider needs to follow-up on her hemoglobin.               Consults  cardiology , GI  Significant Tests:  See full reports for all details     Dg Chest 1 View  Result Date: 05/18/2017 CLINICAL DATA:  Status post left thoracentesis EXAM: CHEST  1 VIEW COMPARISON:  05/18/2017 FINDINGS: Cardiac shadow remains enlarged. Previously seen left pleural effusion has been removed. No pneumothorax is noted following  thoracentesis. Some patchy atelectatic changes are noted in both lungs. No bony abnormality is seen. IMPRESSION: No pneumothorax following thoracentesis on the left. Electronically Signed   By: Inez Catalina M.D.   On: 05/18/2017 15:47   Dg Chest 1 View  Result Date: 05/18/2017 CLINICAL DATA:  Congestive heart failure. EXAM: CHEST  1 VIEW COMPARISON:  Radiograph of May 15, 2017. FINDINGS: Stable cardiomegaly. Atherosclerosis of thoracic aorta is noted. No pneumothorax is noted. Interval development of moderate left pleural effusion with associated atelectasis or infiltrate. Increased interstitial densities are noted in the right lung base concerning for worsening edema or atelectasis. Bony thorax is unremarkable. IMPRESSION: Interval development of moderate left pleural effusion with associated atelectasis or infiltrate. Increased right basilar edema or atelectasis is noted. Aortic Atherosclerosis (ICD10-I70.0). Electronically Signed   By: Marijo Conception, M.D.   On: 05/18/2017 07:20   Dg Chest 2 View  Result Date: 05/15/2017 CLINICAL DATA:  Pneumonia EXAM: CHEST - 2 VIEW COMPARISON:  05/14/2017 FINDINGS: Patchy airspace disease has not developed throughout both lungs. It has a nodular appearance. This has progressed since the prior study. Cardiomegaly. No pneumothorax. No pleural effusion. IMPRESSION: Airspace disease has worsened. There is now patchy airspace disease throughout both lungs. Electronically Signed   By: Marybelle Killings M.D.   On: 05/15/2017 16:19   Nm Myocar Multi W/spect W/wall Motion / Ef  Result Date: 04/23/2017 Pharmacological myocardial perfusion imaging study with no significant  Ischemia Small region of fixed apical thinning, consistent with attenuation artifact GI uptake artifact noted Normal wall motion, EF estimated at 56% No EKG changes concerning for ischemia at peak stress or in recovery.  Low risk scan Signed, Esmond Plants, MD, Ph.D [facility]   US Thoracentesis Asp Pleural  Space W/img Guide  Result Date: 05/18/2017 INDICATION: Left pleural effusion EXAM: ULTRASOUND GUIDED LEFT THORACENTESIS MEDICATIONS: None. COMPLICATIONS: None immediate. PROCEDURE: An ultrasound guided thoracentesis was thoroughly discussed with the patient and questions answered. The benefits, risks, alternatives and complications were also discussed. The patient understands and wishes to proceed with the procedure. Written consent was obtained. Ultrasound was performed to localize and mark an adequate pocket of fluid in the left chest. The area was then prepped and draped in the normal sterile fashion. 1% Lidocaine was used for local anesthesia. Under ultrasound guidance a 6 Fr Safe-T-Centesis catheter was introduced. Thoracentesis was performed. The catheter was removed and a dressing applied. FINDINGS: A total of approximately 600 mL of clear yellow fluid was removed. Samples were sent to the laboratory as requested by the clinical team. IMPRESSION: Successful ultrasound guided left thoracentesis yielding 600 mL of pleural fluid. Electronically Signed   By: Inez Catalina M.D.   On: 05/18/2017 15:45       Today   Subjective:   Amy [name] patient doing much better breathing much improved  Objective:   Blood pressure (!) 164/43, pulse 83, temperature 97.7 F (36.5 C), temperature source Oral, resp. rate 20, height 5\' 2"  (1.575 m), weight 204 lb 12.9 oz (92.9 kg), SpO2 95 %.  .  Intake/Output Summary (Last 24 hours) at 05/20/2017 1123 Last data filed at 05/20/2017 1031 Gross per 24 hour  Intake 810 ml  Output -  Net 810 ml    Exam VITAL SIGNS: Blood pressure (!) 164/43, pulse 83, temperature 97.7 F (36.5 C), temperature source Oral, resp. rate 20, height 5\' 2"  (1.575 m), weight 204 lb 12.9 oz (92.9 kg), SpO2 95 %.  GENERAL:  84 y.o.-year-old patient lying in the bed with no acute distress.  EYES: Pupils equal, round, reactive to light and accommodation. No scleral icterus.  Extraocular muscles intact.  HEENT: Head atraumatic, normocephalic. Oropharynx and nasopharynx clear.  NECK:  Supple, no jugular venous distention. No thyroid enlargement, no tenderness.  LUNGS: Normal breath sounds bilaterally, no wheezing, rales,rhonchi or crepitation. No use of accessory muscles of respiration.  CARDIOVASCULAR: S1, S2 normal. No murmurs, rubs, or gallops.  ABDOMEN: Soft, nontender, nondistended. Bowel sounds present. No organomegaly or mass.  EXTREMITIES: No pedal edema, cyanosis, or clubbing.  NEUROLOGIC: Cranial nerves II through XII are intact. Muscle strength 5/5 in all extremities. Sensation intact. Gait not checked.  PSYCHIATRIC: The patient is alert and oriented x 3.  SKIN: No obvious rash, lesion, or ulcer.   Data Review     CBC w Diff:  Lab Results  Component Value Date   WBC 8.7 05/20/2017   HGB 8.5 (L) 05/20/2017   HGB 13.0 12/20/2013   HCT 25.7 (L) 05/20/2017   HCT 39.8 12/20/2013   PLT 276 05/20/2017   PLT 210 12/20/2013   LYMPHOPCT 4 05/15/2017   LYMPHOPCT 33.0 12/20/2013   MONOPCT 3 05/15/2017   MONOPCT 9.9 12/20/2013   EOSPCT 0 05/15/2017   EOSPCT 4.0 12/20/2013   BASOPCT 1 05/15/2017   BASOPCT 0.3 12/20/2013   CMP:  Lab Results  Component Value Date   NA 140 05/20/2017   NA 145 (H) 03/12/2017   NA 144 10/15/2013   K 4.1 05/20/2017   K 3.9 10/15/2013   CL 103 05/20/2017   CL 113 (H) 10/15/2013   CO2 26 05/20/2017   CO2 23 10/15/2013  BUN 18 05/20/2017   BUN 23 03/12/2017   BUN 12 10/15/2013   CREATININE 1.32 (H) 05/20/2017   CREATININE 1.10 10/15/2013   PROT 5.9 (L) 05/15/2017   PROT 6.2 (L) 10/11/2013   ALBUMIN 2.9 (L) 05/15/2017   ALBUMIN 2.8 (L) 10/11/2013   BILITOT 0.5 05/15/2017   BILITOT 0.2 10/11/2013   ALKPHOS 70 05/15/2017   ALKPHOS 69 10/11/2013   AST 26 05/15/2017   AST 30 10/11/2013   ALT 11 (L) 05/15/2017   ALT 30 10/11/2013  .  Micro Results Recent Results (from the past 240 hour(s))  Blood culture  (routine x 2)     Status: None   Collection Time: 05/15/17  9:34 PM  Result Value Ref Range Status   Specimen Description BLOOD LEFT FOREARM  Final   Special Requests   Final    BOTTLES DRAWN AEROBIC AND ANAEROBIC Blood Culture adequate volume   Culture   Final    NO GROWTH 5 DAYS Performed at [facility], [address] [place], [place] [address]    Report Status 05/20/2017 FINAL  Final  Blood culture (routine x 2)     Status: None   Collection Time: 05/15/17  9:34 PM  Result Value Ref Range Status   Specimen Description BLOOD RIGHT ANTECUBITAL  Final   Special Requests   Final    BOTTLES DRAWN AEROBIC AND ANAEROBIC Blood Culture adequate volume   Culture   Final    NO GROWTH 5 DAYS Performed at [facility], [address], [place], [place] [address]    Report Status 05/20/2017 FINAL  Final  Body fluid culture     Status: None (Preliminary result)   Collection Time: 05/18/17  3:15 PM  Result Value Ref Range Status   Specimen Description   Final    PLEURAL Performed at [facility], [address], [place], [place] [address]    Special Requests   Final    NONE Performed at [facility], [address] [place], [place] [address]    Gram Stain   Final    RARE WBC PRESENT, PREDOMINANTLY PMN NO ORGANISMS SEEN    Culture   Final    NO GROWTH 2 DAYS Performed at [facility], [place] [address] [place], [place] [address]    Report Status PENDING  Incomplete        Code Status Orders  (From admission, onward)        Start     Ordered   05/16/17 0218  Do not attempt resuscitation (DNR)  Continuous    Question Answer Comment  In the event of cardiac or respiratory ARREST Do not call a "code blue"   In the event of cardiac or respiratory ARREST Do not perform Intubation, CPR, defibrillation or ACLS   In the event of cardiac or respiratory ARREST Use medication by any route, position, wound care, and other measures to relive  pain and suffering. May use oxygen, suction and manual treatment of airway obstruction as needed for comfort.      05/16/17 0217    Code Status History    Date Active Date Inactive Code Status Order ID Comments User Context   09/21/2016 1245 09/26/2016 1836 DNR [id number]  [name], MD ED   10/01/2015 1144 10/02/2015 0748 Full Code [id number]  Bettey Costa, MD Inpatient          Follow-up Information    Rise Mu, PA-C Follow up in  2 week(s).   Specialties:  Physician Assistant, Cardiology, Radiology Why:  chf Contact information: [place] [address] [phone]        Leonel Ramsay, MD Follow up in 6 day(s).   Specialty:  Infectious Diseases Why:  hosp f/u Contact information: [facility] [place] [address] [phone]        Minna Merritts, MD .   Specialty:  Cardiology Contact information: [place] [address] [phone]           Discharge Medications   Allergies as of 05/20/2017      Reactions   Losartan Other (See Comments)   Hyperkalemia   Morphine And Related Shortness Of Breath   Rash, difficulty breathing, nausea.   Atenolol Other (See Comments)   Other reaction(s): Other (See Comments) Decreased heart rate Decreased heart rate   Codeine    Rash, difficulty breathing, nausea.   Nsaids Other (See Comments)   Other reaction(s): Unknown   Rofecoxib    Other reaction(s): Unknown   Sucralfate Other (See Comments)   Throat tightness   Sulfa Antibiotics    Other reaction(s): Unknown      Medication List    STOP taking these medications   aspirin EC 81 MG tablet   levofloxacin 500 MG tablet Commonly known as:  LEVAQUIN   predniSONE 20 MG tablet Commonly known as:  DELTASONE Replaced by:  predniSONE 10 MG (21) Tbpk tablet   PRESERVISION AREDS 2 PO   senna 8.6 MG Tabs tablet Commonly known as:  SENOKOT     TAKE these  medications   albuterol 108 (90 Base) MCG/ACT inhaler Commonly known as:  PROVENTIL HFA;VENTOLIN HFA Inhale 2 puffs into the lungs every 6 (six) hours as needed for wheezing or shortness of breath.   amLODipine 10 MG tablet Commonly known as:  NORVASC Take 10 mg by mouth daily.   benzonatate 200 MG capsule Commonly known as:  TESSALON Take 1 capsule (200 mg total) by mouth 3 (three) times daily as needed for cough.   cefUROXime 500 MG tablet Commonly known as:  CEFTIN Take 1 tablet (500 mg total) by mouth 2 (two) times daily for 3 days.   cloNIDine 0.1 mg/24hr patch Commonly known as:  CATAPRES - Dosed in mg/24 hr Place 0.2 mg onto the skin once a week. Patient changes on Friday   clopidogrel 75 MG tablet Commonly known as:  PLAVIX Take 1 tablet (75 mg total) by mouth daily.   docusate sodium 100 MG capsule Commonly known as:  COLACE Take 100 mg by mouth daily as needed for mild constipation.   doxazosin 8 MG tablet Commonly known as:  CARDURA TAKE ONE TABLET TWICE DAILY   furosemide 40 MG tablet Commonly known as:  LASIX Take 1 tablet (40 mg total) by mouth daily. Start taking on:  05/21/2017 What changed:    medication strength  how much to take   guaiFENesin-dextromethorphan 100-10 MG/5ML syrup Commonly known as:  ROBITUSSIN DM Take 5 mLs by mouth every 4 (four) hours as needed for cough.   hydrALAZINE 25 MG tablet Commonly known as:  APRESOLINE Take 1 tablet (25 mg) by mouth two times a day. What changed:    how much to take  how to take this  additional instructions   ipratropium-albuterol 0.5-2.5 (3) MG/3ML Soln Commonly known as:  DUONEB Take 3 mLs by nebulization every 4 (four) hours as  needed (shortness of breath/wheezing.).   levothyroxine 125 MCG tablet Commonly known as:  SYNTHROID, LEVOTHROID Take 125 mcg by mouth daily.   metFORMIN 1000 MG tablet Commonly known as:  GLUCOPHAGE Take 1,000 mg by mouth 2 (two) times daily with a meal.    multivitamin tablet Take 1 tablet by mouth daily.   ondansetron 4 MG disintegrating tablet Commonly known as:  ZOFRAN-ODT Take 4 mg by mouth every 8 (eight) hours as needed for nausea.   oxybutynin 5 MG 24 hr tablet Commonly known as:  DITROPAN-XL Take 5 mg by mouth daily.   pantoprazole 40 MG tablet Commonly known as:  PROTONIX Take 1 tablet (40 mg total) by mouth daily.   predniSONE 10 MG (21) Tbpk tablet Commonly known as:  STERAPRED UNI-PAK 21 TAB Start at 60mg  taper by 10mg  until complete Replaces:  predniSONE 20 MG tablet   simvastatin 40 MG tablet Commonly known as:  ZOCOR Take 1 tablet (40 mg total) by mouth at bedtime.   SYMBICORT 160-4.5 MCG/ACT inhaler Generic drug:  budesonide-formoterol Inhale 2 puffs into the lungs 2 (two) times daily.          Total Time in preparing paper work, data evaluation and todays exam - 45 minutes  Dustin Flock M.D on 05/20/2017 at 11:23 AM [place]  [phone]

## 2017-05-20 NOTE — Progress Notes (Signed)
Pt was given D/C instructions along with her daughter. I gave pt her printed prescriptions and told her about her follow up appts. She had a question for Dr. Bonna Gains so I got in touch with her and she came to talk to her and her daughter. I released her to volunteer services and her daughter.

## 2017-05-20 NOTE — Progress Notes (Signed)
Physical Therapy Treatment Patient Details Name: Amy [name] MRN: [id number] DOB: 1927/11/22 Today's Date: 05/20/2017    History of Present Illness presented to ER secondary to SOB, melena; admitted with sepsis related to PNA.    PT Comments    Participated in exercises as described below.  Reports feeling somewhat better today but still fatigued.  She stood and was able to ambulate 50' this am with walker and min guard with wc follow.  Pt with poor safety pulling up on walker and not reaching back before sitting despite cues.  She needed vc's to keep walker closer to her for safety and to sit when fatigued.  Overall tolerance to activity this session was improved compared to yesterday, she still remains significantly limited.  Due to pt demonstrating poor safety awareness, poor self assessment of fatigue and limitations and not increasing her functional independence as expected, will change discharge recommendations to SNF.  SWS aware.  Pt agrees that going home would be difficult for her at this time.   Follow Up Recommendations  SNF     Equipment Recommendations  Rolling walker with 5" wheels    Recommendations for Other Services       Precautions / Restrictions Precautions Precautions: Fall Restrictions Weight Bearing Restrictions: No    Mobility  Bed Mobility               General bed mobility comments: in recliner  Transfers Overall transfer level: Needs assistance Equipment used: Rolling walker (2 wheeled) Transfers: Sit to/from Stand Sit to Stand: Min guard         General transfer comment: verbal cues for hand placements  Ambulation/Gait   Ambulation Distance (Feet): 50 Feet Assistive device: Rolling walker (2 wheeled)     Gait velocity interpretation: Below normal speed for age/gender     Stairs            Wheelchair Mobility    Modified Rankin (Stroke Patients Only)       Balance Overall balance assessment: Needs  assistance Sitting-balance support: No upper extremity supported;Feet supported Sitting balance-Leahy Scale: Good     Standing balance support: Bilateral upper extremity supported Standing balance-Leahy Scale: Fair                              Cognition Arousal/Alertness: Awake/alert Behavior During Therapy: WFL for tasks assessed/performed Overall Cognitive Status: Within Functional Limits for tasks assessed                                        Exercises Other Exercises Other Exercises: ankle pumps, LAQ and marches 2 x 10 in sitting.    General Comments        Pertinent Vitals/Pain Pain Assessment: No/denies pain    Home Living                      Prior Function            PT Goals (current goals can now be found in the care plan section) Progress towards PT goals: Progressing toward goals    Frequency    Min 2X/week      PT Plan Discharge plan needs to be updated    Co-evaluation              AM-PAC PT "6 Clicks" Daily Activity  Outcome Measure  Difficulty turning over in bed (including adjusting bedclothes, sheets and blankets)?: A Little Difficulty moving from lying on back to sitting on the side of the bed? : A Little Difficulty sitting down on and standing up from a chair with arms (e.g., wheelchair, bedside commode, etc,.)?: A Little Help needed moving to and from a bed to chair (including a wheelchair)?: A Little Help needed walking in hospital room?: A Little Help needed climbing 3-5 steps with a railing? : A Little 6 Click Score: 18    End of Session Equipment Utilized During Treatment: Gait belt Activity Tolerance: Patient limited by fatigue Patient left: in chair;with chair alarm set;with call bell/phone within reach         Time: 0937-1002 PT Time Calculation (min) (ACUTE ONLY): 25 min  Charges:  $Gait Training: 8-22 mins $Therapeutic Exercise: 8-22 mins                    G Codes:       [name], PTA 05/20/17, 10:15 AM

## 2017-05-20 NOTE — Care Management Note (Signed)
Case Management Note  Patient Details  Name: Amy [name] MRN: [id number] Date of Birth: [birth year]   Corene Cornea from [facility] notified of discharge.   Subjective/Objective:                    Action/Plan:   Expected Discharge Date:  05/20/17               Expected Discharge Plan:  [place]  In-House Referral:     Discharge planning Services  CM Consult  Post Acute Care Choice:  Home Health Choice offered to:  Patient  DME Arranged:    DME Agency:     HH Arranged:  PT, RN, Nurse's Aide [place] Agency:  [place]  Status of Service:  Completed, signed off  If discussed at [place] of Stay Meetings, dates discussed:    Additional Comments:  Beverly Sessions, RN 05/20/2017, 11:45 AM

## 2017-05-21 ENCOUNTER — Telehealth: Payer: Self-pay | Admitting: *Deleted

## 2017-05-21 DIAGNOSIS — K922 Gastrointestinal hemorrhage, unspecified: Secondary | ICD-10-CM | POA: Diagnosis not present

## 2017-05-21 DIAGNOSIS — A419 Sepsis, unspecified organism: Secondary | ICD-10-CM | POA: Diagnosis not present

## 2017-05-21 DIAGNOSIS — I2510 Atherosclerotic heart disease of native coronary artery without angina pectoris: Secondary | ICD-10-CM | POA: Diagnosis not present

## 2017-05-21 DIAGNOSIS — Z7951 Long term (current) use of inhaled steroids: Secondary | ICD-10-CM | POA: Diagnosis not present

## 2017-05-21 DIAGNOSIS — D631 Anemia in chronic kidney disease: Secondary | ICD-10-CM | POA: Diagnosis not present

## 2017-05-21 DIAGNOSIS — E039 Hypothyroidism, unspecified: Secondary | ICD-10-CM | POA: Diagnosis not present

## 2017-05-21 DIAGNOSIS — I480 Paroxysmal atrial fibrillation: Secondary | ICD-10-CM | POA: Diagnosis not present

## 2017-05-21 DIAGNOSIS — Z8543 Personal history of malignant neoplasm of ovary: Secondary | ICD-10-CM | POA: Diagnosis not present

## 2017-05-21 DIAGNOSIS — Z96653 Presence of artificial knee joint, bilateral: Secondary | ICD-10-CM | POA: Diagnosis not present

## 2017-05-21 DIAGNOSIS — Z7984 Long term (current) use of oral hypoglycemic drugs: Secondary | ICD-10-CM | POA: Diagnosis not present

## 2017-05-21 DIAGNOSIS — I5033 Acute on chronic diastolic (congestive) heart failure: Secondary | ICD-10-CM | POA: Diagnosis not present

## 2017-05-21 DIAGNOSIS — Z6838 Body mass index (BMI) 38.0-38.9, adult: Secondary | ICD-10-CM | POA: Diagnosis not present

## 2017-05-21 DIAGNOSIS — I130 Hypertensive heart and chronic kidney disease with heart failure and stage 1 through stage 4 chronic kidney disease, or unspecified chronic kidney disease: Secondary | ICD-10-CM | POA: Diagnosis not present

## 2017-05-21 DIAGNOSIS — I6529 Occlusion and stenosis of unspecified carotid artery: Secondary | ICD-10-CM | POA: Diagnosis not present

## 2017-05-21 DIAGNOSIS — Z7902 Long term (current) use of antithrombotics/antiplatelets: Secondary | ICD-10-CM | POA: Diagnosis not present

## 2017-05-21 DIAGNOSIS — E1122 Type 2 diabetes mellitus with diabetic chronic kidney disease: Secondary | ICD-10-CM | POA: Diagnosis not present

## 2017-05-21 DIAGNOSIS — N183 Chronic kidney disease, stage 3 (moderate): Secondary | ICD-10-CM | POA: Diagnosis not present

## 2017-05-21 DIAGNOSIS — J189 Pneumonia, unspecified organism: Secondary | ICD-10-CM | POA: Diagnosis not present

## 2017-05-21 SURGERY — EGD (ESOPHAGOGASTRODUODENOSCOPY)
Anesthesia: General

## 2017-05-21 NOTE — Telephone Encounter (Signed)
Patient contacted regarding discharge from [facility] on Wednesday 05/20/17.  Patient understands to follow up with provider- Dr. Rockey Situ on Thursday 05/28/17 at 8:40 am at the [facility] location. Patient understands discharge instructions? Yes Patient understands medications and regiment? Yes Patient understands to bring all medications to this visit? Yes  The patient states that she has developed some left lower extremity edema today. She reports her lasix was increased at discharge from 20 mg once daily to 40 mg once daily. She has been urinating today. She does not check daily weights. I have advised the patient to continue to monitor her lower extremity swelling and her breathing. She is aware to call us back prior to next Thursday if her swelling/ breathing worsens/ she notices her urine output is down.

## 2017-05-21 NOTE — Telephone Encounter (Signed)
-----   Message from Blain Pais sent at 05/20/2017  1:34 PM EDT ----- Regarding: tcm/ph 4/4 8:40 Dr Rockey Situ

## 2017-05-22 LAB — BODY FLUID CULTURE: Culture: NO GROWTH

## 2017-05-25 DIAGNOSIS — H353211 Exudative age-related macular degeneration, right eye, with active choroidal neovascularization: Secondary | ICD-10-CM | POA: Diagnosis not present

## 2017-05-26 DIAGNOSIS — I5033 Acute on chronic diastolic (congestive) heart failure: Secondary | ICD-10-CM | POA: Diagnosis not present

## 2017-05-26 DIAGNOSIS — I2510 Atherosclerotic heart disease of native coronary artery without angina pectoris: Secondary | ICD-10-CM | POA: Diagnosis not present

## 2017-05-26 DIAGNOSIS — A419 Sepsis, unspecified organism: Secondary | ICD-10-CM | POA: Diagnosis not present

## 2017-05-26 DIAGNOSIS — E1122 Type 2 diabetes mellitus with diabetic chronic kidney disease: Secondary | ICD-10-CM | POA: Diagnosis not present

## 2017-05-26 DIAGNOSIS — J189 Pneumonia, unspecified organism: Secondary | ICD-10-CM | POA: Diagnosis not present

## 2017-05-26 DIAGNOSIS — I130 Hypertensive heart and chronic kidney disease with heart failure and stage 1 through stage 4 chronic kidney disease, or unspecified chronic kidney disease: Secondary | ICD-10-CM | POA: Diagnosis not present

## 2017-05-26 DIAGNOSIS — J918 Pleural effusion in other conditions classified elsewhere: Secondary | ICD-10-CM | POA: Diagnosis not present

## 2017-05-27 DIAGNOSIS — I2510 Atherosclerotic heart disease of native coronary artery without angina pectoris: Secondary | ICD-10-CM | POA: Diagnosis not present

## 2017-05-27 DIAGNOSIS — I130 Hypertensive heart and chronic kidney disease with heart failure and stage 1 through stage 4 chronic kidney disease, or unspecified chronic kidney disease: Secondary | ICD-10-CM | POA: Diagnosis not present

## 2017-05-27 DIAGNOSIS — J189 Pneumonia, unspecified organism: Secondary | ICD-10-CM | POA: Diagnosis not present

## 2017-05-27 DIAGNOSIS — A419 Sepsis, unspecified organism: Secondary | ICD-10-CM | POA: Diagnosis not present

## 2017-05-27 DIAGNOSIS — E1122 Type 2 diabetes mellitus with diabetic chronic kidney disease: Secondary | ICD-10-CM | POA: Diagnosis not present

## 2017-05-27 DIAGNOSIS — I5033 Acute on chronic diastolic (congestive) heart failure: Secondary | ICD-10-CM | POA: Diagnosis not present

## 2017-05-27 NOTE — Progress Notes (Signed)
Cardiology Office Note  Date:  05/28/2017   ID:  [name], DOB 12-04-1927, MRN [id number]  PCP:  Amy Ramsay, MD   Chief Complaint  Patient presents with  . other    Follow up from [facility]. Pt. c/o shortness of breath, weakness and fatigue. Meds reviewed by the pt.'s hospital list.     HPI:  Amy [name] is a pleasant 84 year old woman with a history of  coronary artery disease,  proximal LAD with stent  incontinence,  Beta blocker has been held in the past secondary to bradycardia.  lost her daughter, previous adjustment disorder Long history of poor diet COPD exacerbations morbid obesity Chronic baseline shortness of breath Echo 02/2015 EF 60%, mild AS, LVH Off losartan secondary to hyperkalemia, was on HCTZ Chronic SOB who now presents for routine follow up of her coronary artery disease  Recent hospitalization last week with discharge May 20, 2017 Hospital records reviewed with the patient in detail Sepsis due to pneumonia, upper GI bleed melena, acute on chronic diastolic CHF Notes indicates she had progressive shortness of breath with melena on arrival, pneumonia, treated with antibiotics pleural effusion felt secondary to CHF Asa plavix held prbc x1 HCT 25.7 D/c on plavix with no asa  Stress test 03/2017 Pharmacological myocardial perfusion imaging study with no significant  Ischemia Small region of fixed apical thinning, consistent with attenuation artifact GI uptake artifact noted Normal wall motion, EF estimated at 56% No EKG changes concerning for ischemia at peak stress or in recovery. Low risk scan  In follow-up today still feels weak Chronically low Diastolic pressures   Presenting today in a wheelchair  deconditioned with leg weakness  Reports that prior to hospital admission she was taking Lasix twice a week 20 mg Was discharged on 40 mg daily Does not drink very much, has a very dry mouth Does not have a scale at home  Lab  work reviewed Total chol 156/ LDL 74  EKG personally reviewed by myself on todays visit Normal sinus rhythm rate 87 bpm Right bundle branch block no significant ST or T wave changes   Other past medical history reviewed hospitalization mid August 2017 COPD exacerbation, required steroids, DuoNeb's, flutter device, antibiotics CT scan showing mucus plugging, postobstructive atelectasis rehab, [facility] Now at home  Son in [place] helps her with her house chores  [facility] August 2015. She had diarrhea, vomiting, felt to be viral also with acute renal failure that improved with hydration. Creatinine reached 1.86, one month later creatinine 1.0 as an outpatient Continues to be weak, does not walk much and is deconditioned  Guaiac positive and she had colonoscopy and EGD. She reports Dr. Vira [name] did not find anything Iron transfusions in the past under the direction of Dr. Ma [name].   Prior episode of shortness of breath while walking in the hospital to schedule a colonoscopy appointment. sent to the emergency room for further evaluation. Systolic pressure was in the 170 range. workup at that time was essentially normal and she was discharged home.  cardiac catheter performed at [facility] on June 22, 2009 details 90% proximal LAD, 40% mid LAD, 40% diagonal, 60% proximal circumflex followed by 40% circumflex lesion, 30%, 40% and 30% lesion noted in the RCA with distal RCA with 30% and 25% lesions. Mild atheroma in the aorta. Xience 2.75 x 12 mm Drug eluting stent placed.   stress test 01/14/2012 showing no ischemia, Lexiscan  PMH:   has a past medical history of Bladder incontinence, CAD (  coronary artery disease), Cancer ([place]), Carotid arterial disease w/ R Carotid Bruit (HCC), Chronic diastolic (congestive) heart failure (HCC), Chronic Dyspnea on exertion, CKD (chronic kidney disease) stage 3, GFR 30-59 ml/min (HCC), Degenerative arthritis of knee, Diabetes mellitus, GIB  (gastrointestinal bleeding), Hiatal hernia, Hypertension, Iron deficiency, Menopausal symptoms, Morbid obesity ([name]), Renal insufficiency, and Thyroid disease.  PSH:    Past Surgical History:  Procedure Laterality Date  . COLONOSCOPY  2015  . COLONOSCOPY WITH PROPOFOL N/A 09/25/2016   Procedure: COLONOSCOPY WITH PROPOFOL;  Surgeon: Amy Bellows, MD;  Location: [facility] ENDOSCOPY;  Service: Gastroenterology;  Laterality: N/A;  . COLONOSCOPY WITH PROPOFOL N/A 09/26/2016   Procedure: COLONOSCOPY WITH PROPOFOL;  Surgeon: Amy Bellows, MD;  Location: [facility] ENDOSCOPY;  Service: Gastroenterology;  Laterality: N/A;  . ESOPHAGOGASTRODUODENOSCOPY (EGD) WITH PROPOFOL N/A 09/25/2016   Procedure: ESOPHAGOGASTRODUODENOSCOPY (EGD) WITH PROPOFOL;  Surgeon: Amy Bellows, MD;  Location: [facility] ENDOSCOPY;  Service: Gastroenterology;  Laterality: N/A;  . REPLACEMENT TOTAL KNEE BILATERAL    . TOTAL VAGINAL HYSTERECTOMY     ovarian mass, not cancerous  . UPPER GI ENDOSCOPY  2015    Current Outpatient Medications  Medication Sig Dispense Refill  . albuterol (PROVENTIL HFA;VENTOLIN HFA) 108 (90 Base) MCG/ACT inhaler Inhale 2 puffs into the lungs every 6 (six) hours as needed for wheezing or shortness of breath.    Marland Kitchen amLODipine (NORVASC) 10 MG tablet Take 10 mg by mouth daily.    . cloNIDine (CATAPRES - DOSED IN MG/24 HR) 0.1 mg/24hr patch Place 0.2 mg onto the skin once a week. Patient changes on Friday    . clopidogrel (PLAVIX) 75 MG tablet Take 1 tablet (75 mg total) by mouth daily. 90 tablet 3  . docusate sodium (COLACE) 100 MG capsule Take 100 mg by mouth daily as needed for mild constipation.    Marland Kitchen doxazosin (CARDURA) 8 MG tablet TAKE ONE TABLET TWICE DAILY 180 tablet 3  . furosemide (LASIX) 40 MG tablet Take 1 tablet (40 mg total) by mouth daily. 30 tablet 2  . hydrALAZINE (APRESOLINE) 25 MG tablet Take 1 tablet (25 mg) by mouth two times a day. (Patient taking differently: Take 25 mg by mouth. For systolic BP 409) 811  tablet 2  . ipratropium-albuterol (DUONEB) 0.5-2.5 (3) MG/3ML SOLN Take 3 mLs by nebulization every 4 (four) hours as needed (shortness of breath/wheezing.). 360 mL   . levothyroxine (SYNTHROID, LEVOTHROID) 125 MCG tablet Take 125 mcg by mouth daily.    . metFORMIN (GLUCOPHAGE) 1000 MG tablet Take 1,000 mg by mouth 2 (two) times daily with a meal.    . Multiple Vitamin (MULTIVITAMIN) tablet Take 1 tablet by mouth daily.    Marland Kitchen oxybutynin (DITROPAN-XL) 5 MG 24 hr tablet Take 5 mg by mouth daily.     . pantoprazole (PROTONIX) 40 MG tablet Take 1 tablet (40 mg total) by mouth daily. 30 tablet 1  . predniSONE (STERAPRED UNI-PAK 21 TAB) 10 MG (21) TBPK tablet Start at 60mg  taper by 10mg  until complete 21 tablet 0  . simvastatin (ZOCOR) 40 MG tablet Take 1 tablet (40 mg total) by mouth at bedtime. 90 tablet 3  . SYMBICORT 160-4.5 MCG/ACT inhaler Inhale 2 puffs into the lungs 2 (two) times daily.      No current facility-administered medications for this visit.      Allergies:   Losartan; Morphine and related; Atenolol; Codeine; Nsaids; Rofecoxib; Sucralfate; and Sulfa antibiotics   Social History:  The patient  reports that she has never smoked. She  has never used smokeless tobacco. She reports that she does not drink alcohol or use drugs.   Family History:   family history includes Breast cancer (age of onset: 35) in her mother.    Review of Systems: Review of Systems  Constitutional: Positive for malaise/fatigue.  Respiratory: Negative.   Cardiovascular: Negative.   Gastrointestinal: Negative.   Musculoskeletal: Negative.        Leg weakness  Neurological: Negative.   Psychiatric/Behavioral: Negative.   All other systems reviewed and are negative.    PHYSICAL EXAM: VS:  BP (!) 130/24 (BP Location: Left Arm, Patient Position: Sitting, Cuff Size: Large)   Pulse 87   Ht 5\' 2"  (1.575 m)   Wt 197 lb (89.4 kg)   BMI 36.03 kg/m  , BMI Body mass index is 36.03 kg/m.  Constitutional:   oriented to person, place, and time. No distress.  Presenting in a wheelchair HENT:  Head: Normocephalic and atraumatic.  Eyes:  no discharge. No scleral icterus.  Neck: Normal range of motion. Neck supple. No JVD present.  Cardiovascular: Normal rate, regular rhythm, normal heart sounds and intact distal pulses. Exam reveals no gallop and no friction rub. No edema No murmur heard. Pulmonary/Chest: Effort normal and breath sounds normal. No stridor. No respiratory distress.  no wheezes.  no rales.  no tenderness.  Abdominal: Soft.  no distension.  no tenderness.  Musculoskeletal: Normal range of motion.  no  tenderness or deformity.  Neurological:  normal muscle tone. Coordination normal. No atrophy Skin: Skin is warm and dry. No rash noted. not diaphoretic.  Psychiatric:  normal mood and affect. behavior is normal. Thought content normal.    Recent Labs: 05/15/2017: ALT 11 05/20/2017: BUN 18; Creatinine, Ser 1.32; Hemoglobin 8.5; Platelets 276; Potassium 4.1; Sodium 140    Lipid Panel Lab Results  Component Value Date   CHOL 135 06/25/2010   HDL 35 (L) 06/25/2010   LDLCALC 56 06/25/2010   TRIG 220 (H) 06/25/2010      Wt Readings from Last 3 Encounters:  05/28/17 197 lb (89.4 kg)  05/20/17 204 lb 12.9 oz (92.9 kg)  04/13/17 207 lb 8 oz (94.1 kg)       ASSESSMENT AND PLAN:  SOB (shortness of breath) - Plan: EKG 12-Lead Lungs clear on auscultation today Baseline deconditioning Working with physical therapy at home Weight down 10 pounds from February  Coronary artery disease of native artery of native heart with stable angina pectoris [facility])  pharmacologic Myoview in February with no ischemia On Plavix, aspirin held given recent GI bleed  Essential hypertension - Plan: EKG 12-Lead No medication changes made, very low diastolic pressure, asymptomatic  Chronic diastolic CHF (congestive heart failure) (HCC) We will decrease Lasix down to 20 mg daily, she is not drinking  much has dry mouth We have provided her with a scale today from the CHF clinic She will set up visit with CHF clinic in 7-10 days Recommend she call us early next week with her weight Previously was taking Lasix 2 days a week.  She feels that 40 daily is going to be too much  Lungs clear on auscultation no recurrent pleural effusion  Paroxysmal atrial fibrillation (HCC) - Plan: EKG 12-Lead Maintaining normal sinus rhythm Not on anticoagulation stable  COPD exacerbation (HCC) - Plan: EKG 12-Lead Recent pneumonia, sepsis, doing well today  Acute renal failure superimposed on stage 3 chronic kidney disease, unspecified acute renal failure type ([name]) On Lasix daily we will decrease the dose  creatinine 1.3 Would recommend repeat lab work in 7-10 days when she sees CHF clinic  Anemia, unspecified type - Followed by Dr. Grayland Ormond,  Received iron infusion while in the hospital and blood transfusion x1 for melena and anemia   Total encounter time more than 45 minutes  Greater than 50% was spent in counseling and coordination of care with the patient  Disposition:   F/U  3 months   Orders Placed This Encounter  Procedures  . EKG 12-Lead     Signed, Esmond Plants, M.D., Ph.D. 05/28/2017  [place], [place]

## 2017-05-28 ENCOUNTER — Ambulatory Visit (INDEPENDENT_AMBULATORY_CARE_PROVIDER_SITE_OTHER): Payer: Medicare Other | Admitting: Cardiovascular Disease

## 2017-05-28 ENCOUNTER — Encounter: Payer: Self-pay | Admitting: Cardiovascular Disease

## 2017-05-28 VITALS — BP 130/24 | HR 87 | Ht 62.0 in | Wt 197.0 lb

## 2017-05-28 DIAGNOSIS — I25118 Atherosclerotic heart disease of native coronary artery with other forms of angina pectoris: Secondary | ICD-10-CM

## 2017-05-28 DIAGNOSIS — N189 Chronic kidney disease, unspecified: Secondary | ICD-10-CM

## 2017-05-28 DIAGNOSIS — I2510 Atherosclerotic heart disease of native coronary artery without angina pectoris: Secondary | ICD-10-CM

## 2017-05-28 DIAGNOSIS — E782 Mixed hyperlipidemia: Secondary | ICD-10-CM | POA: Diagnosis not present

## 2017-05-28 DIAGNOSIS — I5032 Chronic diastolic (congestive) heart failure: Secondary | ICD-10-CM | POA: Diagnosis not present

## 2017-05-28 DIAGNOSIS — R0609 Other forms of dyspnea: Secondary | ICD-10-CM | POA: Diagnosis not present

## 2017-05-28 DIAGNOSIS — I10 Essential (primary) hypertension: Secondary | ICD-10-CM | POA: Diagnosis not present

## 2017-05-28 NOTE — Patient Instructions (Signed)
Medication Instructions:   Watch your weight Stay on lasix 20 daily For weight gain of 3 pounds Increase up to lasix 40 mg until weight comes back down  Labwork:  No new labs needed  Testing/Procedures:  No further testing at this time   Follow-Up: It was a pleasure seeing you in the office today. Please call us if you have new issues that need to be addressed before your next appt.  [phone]  Your physician wants you to follow-up in: 3 months.    If you need a refill on your cardiac medications before your next appointment, please call your pharmacy.  For educational health videos Log in to : [email or web address] Or : [email or web address], password : [email or web address]

## 2017-05-29 DIAGNOSIS — J189 Pneumonia, unspecified organism: Secondary | ICD-10-CM | POA: Diagnosis not present

## 2017-05-29 DIAGNOSIS — A419 Sepsis, unspecified organism: Secondary | ICD-10-CM | POA: Diagnosis not present

## 2017-05-29 DIAGNOSIS — I130 Hypertensive heart and chronic kidney disease with heart failure and stage 1 through stage 4 chronic kidney disease, or unspecified chronic kidney disease: Secondary | ICD-10-CM | POA: Diagnosis not present

## 2017-05-29 DIAGNOSIS — I5033 Acute on chronic diastolic (congestive) heart failure: Secondary | ICD-10-CM | POA: Diagnosis not present

## 2017-05-29 DIAGNOSIS — E1122 Type 2 diabetes mellitus with diabetic chronic kidney disease: Secondary | ICD-10-CM | POA: Diagnosis not present

## 2017-05-29 DIAGNOSIS — I2510 Atherosclerotic heart disease of native coronary artery without angina pectoris: Secondary | ICD-10-CM | POA: Diagnosis not present

## 2017-06-01 DIAGNOSIS — E538 Deficiency of other specified B group vitamins: Secondary | ICD-10-CM | POA: Diagnosis not present

## 2017-06-01 DIAGNOSIS — I130 Hypertensive heart and chronic kidney disease with heart failure and stage 1 through stage 4 chronic kidney disease, or unspecified chronic kidney disease: Secondary | ICD-10-CM | POA: Diagnosis not present

## 2017-06-01 DIAGNOSIS — I5033 Acute on chronic diastolic (congestive) heart failure: Secondary | ICD-10-CM | POA: Diagnosis not present

## 2017-06-01 DIAGNOSIS — E1122 Type 2 diabetes mellitus with diabetic chronic kidney disease: Secondary | ICD-10-CM | POA: Diagnosis not present

## 2017-06-01 DIAGNOSIS — A419 Sepsis, unspecified organism: Secondary | ICD-10-CM | POA: Diagnosis not present

## 2017-06-01 DIAGNOSIS — J189 Pneumonia, unspecified organism: Secondary | ICD-10-CM | POA: Diagnosis not present

## 2017-06-01 DIAGNOSIS — I2510 Atherosclerotic heart disease of native coronary artery without angina pectoris: Secondary | ICD-10-CM | POA: Diagnosis not present

## 2017-06-03 DIAGNOSIS — A419 Sepsis, unspecified organism: Secondary | ICD-10-CM | POA: Diagnosis not present

## 2017-06-03 DIAGNOSIS — E1122 Type 2 diabetes mellitus with diabetic chronic kidney disease: Secondary | ICD-10-CM | POA: Diagnosis not present

## 2017-06-03 DIAGNOSIS — I2510 Atherosclerotic heart disease of native coronary artery without angina pectoris: Secondary | ICD-10-CM | POA: Diagnosis not present

## 2017-06-03 DIAGNOSIS — I130 Hypertensive heart and chronic kidney disease with heart failure and stage 1 through stage 4 chronic kidney disease, or unspecified chronic kidney disease: Secondary | ICD-10-CM | POA: Diagnosis not present

## 2017-06-03 DIAGNOSIS — I5033 Acute on chronic diastolic (congestive) heart failure: Secondary | ICD-10-CM | POA: Diagnosis not present

## 2017-06-03 DIAGNOSIS — K922 Gastrointestinal hemorrhage, unspecified: Secondary | ICD-10-CM | POA: Diagnosis not present

## 2017-06-03 DIAGNOSIS — E039 Hypothyroidism, unspecified: Secondary | ICD-10-CM | POA: Diagnosis not present

## 2017-06-03 DIAGNOSIS — D631 Anemia in chronic kidney disease: Secondary | ICD-10-CM | POA: Diagnosis not present

## 2017-06-03 DIAGNOSIS — I6529 Occlusion and stenosis of unspecified carotid artery: Secondary | ICD-10-CM | POA: Diagnosis not present

## 2017-06-03 DIAGNOSIS — J189 Pneumonia, unspecified organism: Secondary | ICD-10-CM | POA: Diagnosis not present

## 2017-06-03 DIAGNOSIS — I480 Paroxysmal atrial fibrillation: Secondary | ICD-10-CM | POA: Diagnosis not present

## 2017-06-03 DIAGNOSIS — N183 Chronic kidney disease, stage 3 (moderate): Secondary | ICD-10-CM | POA: Diagnosis not present

## 2017-06-04 DIAGNOSIS — E1122 Type 2 diabetes mellitus with diabetic chronic kidney disease: Secondary | ICD-10-CM | POA: Diagnosis not present

## 2017-06-04 DIAGNOSIS — I5033 Acute on chronic diastolic (congestive) heart failure: Secondary | ICD-10-CM | POA: Diagnosis not present

## 2017-06-04 DIAGNOSIS — I2510 Atherosclerotic heart disease of native coronary artery without angina pectoris: Secondary | ICD-10-CM | POA: Diagnosis not present

## 2017-06-04 DIAGNOSIS — I130 Hypertensive heart and chronic kidney disease with heart failure and stage 1 through stage 4 chronic kidney disease, or unspecified chronic kidney disease: Secondary | ICD-10-CM | POA: Diagnosis not present

## 2017-06-04 DIAGNOSIS — J189 Pneumonia, unspecified organism: Secondary | ICD-10-CM | POA: Diagnosis not present

## 2017-06-04 DIAGNOSIS — A419 Sepsis, unspecified organism: Secondary | ICD-10-CM | POA: Diagnosis not present

## 2017-06-05 DIAGNOSIS — J189 Pneumonia, unspecified organism: Secondary | ICD-10-CM | POA: Diagnosis not present

## 2017-06-05 DIAGNOSIS — I2510 Atherosclerotic heart disease of native coronary artery without angina pectoris: Secondary | ICD-10-CM | POA: Diagnosis not present

## 2017-06-05 DIAGNOSIS — I130 Hypertensive heart and chronic kidney disease with heart failure and stage 1 through stage 4 chronic kidney disease, or unspecified chronic kidney disease: Secondary | ICD-10-CM | POA: Diagnosis not present

## 2017-06-05 DIAGNOSIS — A419 Sepsis, unspecified organism: Secondary | ICD-10-CM | POA: Diagnosis not present

## 2017-06-05 DIAGNOSIS — E1122 Type 2 diabetes mellitus with diabetic chronic kidney disease: Secondary | ICD-10-CM | POA: Diagnosis not present

## 2017-06-05 DIAGNOSIS — I5033 Acute on chronic diastolic (congestive) heart failure: Secondary | ICD-10-CM | POA: Diagnosis not present

## 2017-06-08 ENCOUNTER — Ambulatory Visit: Payer: Medicare Other | Admitting: Family

## 2017-06-08 DIAGNOSIS — I2510 Atherosclerotic heart disease of native coronary artery without angina pectoris: Secondary | ICD-10-CM | POA: Diagnosis not present

## 2017-06-08 DIAGNOSIS — E1122 Type 2 diabetes mellitus with diabetic chronic kidney disease: Secondary | ICD-10-CM | POA: Diagnosis not present

## 2017-06-08 DIAGNOSIS — I5033 Acute on chronic diastolic (congestive) heart failure: Secondary | ICD-10-CM | POA: Diagnosis not present

## 2017-06-08 DIAGNOSIS — J189 Pneumonia, unspecified organism: Secondary | ICD-10-CM | POA: Diagnosis not present

## 2017-06-08 DIAGNOSIS — A419 Sepsis, unspecified organism: Secondary | ICD-10-CM | POA: Diagnosis not present

## 2017-06-08 DIAGNOSIS — I130 Hypertensive heart and chronic kidney disease with heart failure and stage 1 through stage 4 chronic kidney disease, or unspecified chronic kidney disease: Secondary | ICD-10-CM | POA: Diagnosis not present

## 2017-06-09 DIAGNOSIS — I130 Hypertensive heart and chronic kidney disease with heart failure and stage 1 through stage 4 chronic kidney disease, or unspecified chronic kidney disease: Secondary | ICD-10-CM | POA: Diagnosis not present

## 2017-06-09 DIAGNOSIS — I5033 Acute on chronic diastolic (congestive) heart failure: Secondary | ICD-10-CM | POA: Diagnosis not present

## 2017-06-09 DIAGNOSIS — I2510 Atherosclerotic heart disease of native coronary artery without angina pectoris: Secondary | ICD-10-CM | POA: Diagnosis not present

## 2017-06-09 DIAGNOSIS — A419 Sepsis, unspecified organism: Secondary | ICD-10-CM | POA: Diagnosis not present

## 2017-06-09 DIAGNOSIS — E1122 Type 2 diabetes mellitus with diabetic chronic kidney disease: Secondary | ICD-10-CM | POA: Diagnosis not present

## 2017-06-09 DIAGNOSIS — J189 Pneumonia, unspecified organism: Secondary | ICD-10-CM | POA: Diagnosis not present

## 2017-06-11 DIAGNOSIS — E1122 Type 2 diabetes mellitus with diabetic chronic kidney disease: Secondary | ICD-10-CM | POA: Diagnosis not present

## 2017-06-11 DIAGNOSIS — J189 Pneumonia, unspecified organism: Secondary | ICD-10-CM | POA: Diagnosis not present

## 2017-06-11 DIAGNOSIS — I130 Hypertensive heart and chronic kidney disease with heart failure and stage 1 through stage 4 chronic kidney disease, or unspecified chronic kidney disease: Secondary | ICD-10-CM | POA: Diagnosis not present

## 2017-06-11 DIAGNOSIS — I2510 Atherosclerotic heart disease of native coronary artery without angina pectoris: Secondary | ICD-10-CM | POA: Diagnosis not present

## 2017-06-11 DIAGNOSIS — I5033 Acute on chronic diastolic (congestive) heart failure: Secondary | ICD-10-CM | POA: Diagnosis not present

## 2017-06-11 DIAGNOSIS — A419 Sepsis, unspecified organism: Secondary | ICD-10-CM | POA: Diagnosis not present

## 2017-06-15 ENCOUNTER — Telehealth: Payer: Self-pay | Admitting: Gastroenterology

## 2017-06-15 NOTE — Telephone Encounter (Signed)
Left vm for pt to call the office and schedule 1-2 week fu with Dr. Bonna Gains

## 2017-06-18 NOTE — Telephone Encounter (Signed)
Left another vm for pt to call and schedule Fu with Dr. Bonna Gains

## 2017-06-21 NOTE — Progress Notes (Signed)
[place]  [phone] [phone]  ID: Amy [name] OB: [birth year]  MR#: [id number]  FUX#:[id number]  Patient Care Team: Leonel Ramsay, MD as PCP - General (Infectious Diseases) Rockey Situ Kathlene November, MD as PCP - Cardiology (Cardiology) Minna Merritts, MD as Consulting Physician (Cardiology)  CHIEF COMPLAINT: Iron deficiency anemia secondary to chronic blood loss.  INTERVAL HISTORY: Patient returns to clinic today for repeat laboratory work and further evaluation.  Her appointment was moved up several weeks after recent hospitalization for GI bleed.  She received multiple units of packed red blood cells while in the hospital and feels significantly improved. She continues to have chronic weakness and fatigue.  She has no neurologic complaints. She denies any recent fevers or illnesses. She denies any chest pain, cough, or hemoptysis.  She reports continued chronic shortness of breath.  She denies any nausea, vomiting, constipation, or diarrhea.  She denies any further melena or hematochezia.  She has no urinary complaints.  Patient offers no further specific complaints today.    REVIEW OF SYSTEMS:   Review of Systems  Constitutional: Positive for malaise/fatigue. Negative for fever and weight loss.  HENT: Negative.   Respiratory: Positive for shortness of breath. Negative for cough and hemoptysis.   Cardiovascular: Negative.  Negative for chest pain and leg swelling.  Gastrointestinal: Negative.  Negative for abdominal pain, blood in stool and melena.  Genitourinary: Negative.  Negative for hematuria.  Musculoskeletal: Negative.  Negative for back pain.  Skin: Negative.  Negative for rash.  Neurological: Positive for weakness. Negative for sensory change and focal weakness.  Psychiatric/Behavioral: Negative.  The patient is not nervous/anxious.     As per HPI. Otherwise, a complete review of systems is negative.  PAST MEDICAL  HISTORY: Past Medical History:  Diagnosis Date  . Bladder incontinence   . CAD (coronary artery disease)    a. 05/2009 Cath: LAD 90p (Xience 2.75 x 12 mm DES), 59m, D1 40, LCx 40p/m, RCA 30/40/30p/m, RCA 30/25d;  b.  12/2011 Lexiscan MV: no ischemia, breast attenuation artifact, normal EF-->Low risk; c. 10/2016 MV: fixed apical defect, most likely apical thinning and attenuation, No ischemia, EF 60%.  . Cancer ([place])    ovarian  . Carotid arterial disease w/ R Carotid Bruit (HCC)    a. 08/2016 Carotid U/S: 40-59% bilat ICA stenosis - f/u 1 yr.  . Chronic diastolic (congestive) heart failure ([place])    a. Echo 09/2015: EF 55-60% w/ Grade 1 DD, sev Ca2+ MV annulus, mildly dil LA; b. 09/2016 Echo: EF 65-70%, Gr2 DD, mildly dil LA/RA, nl RV fxn.  . Chronic Dyspnea on exertion   . CKD (chronic kidney disease) stage 3, GFR 30-59 ml/min (HCC)   . Degenerative arthritis of knee    bilateral knees  . Diabetes mellitus    Type II  . GIB (gastrointestinal bleeding)    a. 08/2016 Admission w/ presyncope/anemia/melena-->req Transfusion-->endo ok, colonoscopy w/ polyps but no source of bleeding (most likely diverticular).  . Hiatal hernia   . Hypertension   . Iron deficiency   . Menopausal symptoms   . Morbid obesity ([place])   . Renal insufficiency   . Thyroid disease    hypothyroidism    PAST SURGICAL HISTORY: Past Surgical History:  Procedure Laterality Date  . COLONOSCOPY  2015  . COLONOSCOPY WITH PROPOFOL N/A 09/25/2016   Procedure: COLONOSCOPY WITH PROPOFOL;  Surgeon: [name], MD;  Location: [facility] ENDOSCOPY;  Service: Gastroenterology;  Laterality: N/A;  . COLONOSCOPY  WITH PROPOFOL N/A 09/26/2016   Procedure: COLONOSCOPY WITH PROPOFOL;  Surgeon: [name], MD;  Location: [facility] ENDOSCOPY;  Service: Gastroenterology;  Laterality: N/A;  . ESOPHAGOGASTRODUODENOSCOPY (EGD) WITH PROPOFOL N/A 09/25/2016   Procedure: ESOPHAGOGASTRODUODENOSCOPY (EGD) WITH PROPOFOL;  Surgeon: [name], MD;  Location: [facility]  ENDOSCOPY;  Service: Gastroenterology;  Laterality: N/A;  . REPLACEMENT TOTAL KNEE BILATERAL    . TOTAL VAGINAL HYSTERECTOMY     ovarian mass, not cancerous  . UPPER GI ENDOSCOPY  2015    FAMILY HISTORY Family History  Problem Relation Age of Onset  . Breast cancer Mother 78       ADVANCED DIRECTIVES:    HEALTH MAINTENANCE: Social History   Tobacco Use  . Smoking status: Never Smoker  . Smokeless tobacco: Never Used  Substance Use Topics  . Alcohol use: No  . Drug use: No     Colonoscopy:  PAP:  Bone density:  Lipid panel:  Allergies  Allergen Reactions  . Losartan Other (See Comments)    Hyperkalemia  . Morphine And Related Shortness Of Breath    Rash, difficulty breathing, nausea.   . Atenolol Other (See Comments)    Other reaction(s): Other (See Comments) Decreased heart rate Decreased heart rate  . Codeine     Rash, difficulty breathing, nausea.  . Nsaids Other (See Comments)    Other reaction(s): Unknown  . Rofecoxib     Other reaction(s): Unknown  . Sucralfate Other (See Comments)    Throat tightness  . Sulfa Antibiotics     Other reaction(s): Unknown    Current Outpatient Medications  Medication Sig Dispense Refill  . albuterol (PROVENTIL HFA;VENTOLIN HFA) 108 (90 Base) MCG/ACT inhaler Inhale 2 puffs into the lungs every 6 (six) hours as needed for wheezing or shortness of breath.    Marland Kitchen amLODipine (NORVASC) 10 MG tablet Take 10 mg by mouth daily.    . cloNIDine (CATAPRES - DOSED IN MG/24 HR) 0.1 mg/24hr patch Place 0.2 mg onto the skin once a week. Patient changes on Friday    . clopidogrel (PLAVIX) 75 MG tablet Take 1 tablet (75 mg total) by mouth daily. 90 tablet 3  . docusate sodium (COLACE) 100 MG capsule Take 100 mg by mouth daily as needed for mild constipation.    Marland Kitchen doxazosin (CARDURA) 8 MG tablet TAKE ONE TABLET TWICE DAILY 180 tablet 3  . furosemide (LASIX) 40 MG tablet Take 1 tablet (40 mg total) by mouth daily. 30 tablet 2  .  hydrALAZINE (APRESOLINE) 25 MG tablet Take 1 tablet (25 mg) by mouth two times a day. (Patient taking differently: Take 25 mg by mouth. For systolic BP 846) 962 tablet 2  . ipratropium-albuterol (DUONEB) 0.5-2.5 (3) MG/3ML SOLN Take 3 mLs by nebulization every 4 (four) hours as needed (shortness of breath/wheezing.). 360 mL   . levothyroxine (SYNTHROID, LEVOTHROID) 125 MCG tablet Take 125 mcg by mouth daily.    . metFORMIN (GLUCOPHAGE) 1000 MG tablet Take 1,000 mg by mouth 2 (two) times daily with a meal.    . Multiple Vitamin (MULTIVITAMIN) tablet Take 1 tablet by mouth daily.    Marland Kitchen oxybutynin (DITROPAN-XL) 5 MG 24 hr tablet Take 5 mg by mouth daily.     . pantoprazole (PROTONIX) 40 MG tablet Take 1 tablet (40 mg total) by mouth daily. 30 tablet 1  . simvastatin (ZOCOR) 40 MG tablet Take 1 tablet (40 mg total) by mouth at bedtime. 90 tablet 3  . SYMBICORT 160-4.5 MCG/ACT inhaler Inhale 2  puffs into the lungs 2 (two) times daily.      No current facility-administered medications for this visit.    Facility-Administered Medications Ordered in Other Visits  Medication Dose Route Frequency Provider Last Rate Last Dose  . 0.9 %  sodium chloride infusion   Intravenous Continuous Lloyd Huger, MD      . ferumoxytol [facility]) 510 mg in sodium chloride 0.9 % 100 mL IVPB  510 mg Intravenous Once Lloyd Huger, MD        OBJECTIVE: Vitals:   06/22/17 1128  BP: (!) 154/71  Pulse: 67  Resp: 20  Temp: (!) 97 F (36.1 C)     Body mass index is 35.65 kg/m.    ECOG FS:2 - Symptomatic, <50% confined to bed  General: Well-developed, well-nourished, no acute distress. Eyes: Pink conjunctiva, anicteric sclera. Lungs: Clear to auscultation bilaterally. Heart: Regular rate and rhythm. No rubs, murmurs, or gallops. Abdomen: Soft, nontender, nondistended. No organomegaly noted, normoactive bowel sounds. Musculoskeletal: No edema, cyanosis, or clubbing. Neuro: Alert, answering all questions  appropriately. Cranial nerves grossly intact. Skin: No rashes or petechiae noted. Psych: Normal affect.  LAB RESULTS:  Lab Results  Component Value Date   NA 140 05/20/2017   K 4.1 05/20/2017   CL 103 05/20/2017   CO2 26 05/20/2017   GLUCOSE 146 (H) 05/20/2017   BUN 18 05/20/2017   CREATININE 1.32 (H) 05/20/2017   CALCIUM 7.8 (L) 05/20/2017   PROT 5.9 (L) 05/15/2017   ALBUMIN 2.9 (L) 05/15/2017   AST 26 05/15/2017   ALT 11 (L) 05/15/2017   ALKPHOS 70 05/15/2017   BILITOT 0.5 05/15/2017   GFRNONAA 35 (L) 05/20/2017   GFRAA 40 (L) 05/20/2017    Lab Results  Component Value Date   WBC 5.8 06/22/2017   NEUTROABS 3.6 06/22/2017   HGB 10.8 (L) 06/22/2017   HCT 32.3 (L) 06/22/2017   MCV 90.9 06/22/2017   PLT 164 06/22/2017   Lab Results  Component Value Date   IRON 52 06/22/2017   TIBC 265 06/22/2017   IRONPCTSAT 20 06/22/2017    Lab Results  Component Value Date   FERRITIN 46 06/22/2017    STUDIES: No results found.  ASSESSMENT:  Iron deficiency anemia secondary to chronic blood loss, shortness of breath.   PLAN:    1. Iron deficiency anemia secondary to chronic blood loss: Previously, patient was reported to be intolerant of oral iron supplementation. She also had an EGD and colonoscopy in May 2015 for heme positive stools but only revealed esophagitis and tubular adenomas in her colon.  She has a follow-up appointment with GI on Jun 24, 2017.  Her hemoglobin is improved from her admission, but still decreased.  Her iron stores are within normal limits, but despite this we will proceed with 510 mg IV Feraheme given her recent GI bleed.  She does not require second infusion.  Return to clinic in 2 months with repeat laboratory work and further evaluation.   2. Pulmonary nodules: No intervention needed.  Repeat CT scan from January 13, 2017 revealed stable pulmonary nodules.  No further imaging is necessary.   3. Renal insufficiency: Mild, monitor. 4.  Hypertension:  Patient's blood pressure is mildly elevated today.  Continue monitoring and treatment per cardiology. 5.  GI bleed: Follow-up with GI as above.  Approximately 30 minutes was spent in discussion of which greater than 50% was consultation.  Patient expressed understanding and was in agreement with this plan. She also understands  that She can call clinic at any time with any questions, concerns, or complaints.    Lloyd Huger, MD   06/23/2017 10:57 PM

## 2017-06-22 ENCOUNTER — Inpatient Hospital Stay: Payer: Medicare Other

## 2017-06-22 ENCOUNTER — Inpatient Hospital Stay: Payer: Medicare Other | Attending: Oncology | Admitting: Oncology

## 2017-06-22 ENCOUNTER — Encounter: Payer: Self-pay | Admitting: Oncology

## 2017-06-22 VITALS — BP 154/71 | HR 67 | Temp 97.0°F | Resp 20 | Wt 194.9 lb

## 2017-06-22 DIAGNOSIS — R0602 Shortness of breath: Secondary | ICD-10-CM

## 2017-06-22 DIAGNOSIS — N289 Disorder of kidney and ureter, unspecified: Secondary | ICD-10-CM

## 2017-06-22 DIAGNOSIS — K922 Gastrointestinal hemorrhage, unspecified: Secondary | ICD-10-CM | POA: Diagnosis not present

## 2017-06-22 DIAGNOSIS — I10 Essential (primary) hypertension: Secondary | ICD-10-CM | POA: Diagnosis not present

## 2017-06-22 DIAGNOSIS — D500 Iron deficiency anemia secondary to blood loss (chronic): Secondary | ICD-10-CM

## 2017-06-22 DIAGNOSIS — R911 Solitary pulmonary nodule: Secondary | ICD-10-CM

## 2017-06-22 DIAGNOSIS — R531 Weakness: Secondary | ICD-10-CM

## 2017-06-22 DIAGNOSIS — R5382 Chronic fatigue, unspecified: Secondary | ICD-10-CM

## 2017-06-22 DIAGNOSIS — D509 Iron deficiency anemia, unspecified: Secondary | ICD-10-CM

## 2017-06-22 LAB — CBC WITH DIFFERENTIAL/PLATELET
Basophils Absolute: 0 K/uL (ref 0–0.1)
Basophils Relative: 0 %
Eosinophils Absolute: 0.1 K/uL (ref 0–0.7)
Eosinophils Relative: 2 %
HCT: 32.3 % — ABNORMAL LOW (ref 35.0–47.0)
Hemoglobin: 10.8 g/dL — ABNORMAL LOW (ref 12.0–16.0)
Lymphocytes Relative: 28 %
Lymphs Abs: 1.6 K/uL (ref 1.0–3.6)
MCH: 30.3 pg (ref 26.0–34.0)
MCHC: 33.3 g/dL (ref 32.0–36.0)
MCV: 90.9 fL (ref 80.0–100.0)
Monocytes Absolute: 0.5 K/uL (ref 0.2–0.9)
Monocytes Relative: 9 %
Neutro Abs: 3.6 K/uL (ref 1.4–6.5)
Neutrophils Relative %: 61 %
Platelets: 164 K/uL (ref 150–440)
RBC: 3.55 MIL/uL — ABNORMAL LOW (ref 3.80–5.20)
RDW: 17 % — ABNORMAL HIGH (ref 11.5–14.5)
WBC: 5.8 K/uL (ref 3.6–11.0)

## 2017-06-22 LAB — FERRITIN: Ferritin: 46 ng/mL (ref 11–307)

## 2017-06-22 LAB — IRON AND TIBC
Iron: 52 ug/dL (ref 28–170)
Saturation Ratios: 20 % (ref 10.4–31.8)
TIBC: 265 ug/dL (ref 250–450)
UIBC: 213 ug/dL

## 2017-06-22 MED ORDER — FERUMOXYTOL INJECTION 510 MG/17 ML
510.0000 mg | Freq: Once | INTRAVENOUS | Status: DC
  Filled 2017-06-22: qty 17

## 2017-06-22 MED ORDER — SODIUM CHLORIDE 0.9 % IV SOLN
INTRAVENOUS | Status: AC
  Filled 2017-06-22: qty 1000

## 2017-06-22 NOTE — Progress Notes (Unsigned)
Pt here for Feraheme infusion.  Multiple IVs started but unable to receive medication because veins blew repeatedly after starting saline.  Dr. Grayland Ormond notified, pt rescheduled to return later in the week.

## 2017-06-22 NOTE — Progress Notes (Signed)
Patient reports recent hospitalization for bleeding. Patient reports she received blood transfusions while in the hospital, is scheduled to follow up with GI on 5/1. Patient reports she is feeling stronger.

## 2017-06-23 ENCOUNTER — Inpatient Hospital Stay: Payer: Medicare Other

## 2017-06-23 VITALS — BP 145/64 | HR 67 | Temp 98.1°F | Resp 20

## 2017-06-23 DIAGNOSIS — I10 Essential (primary) hypertension: Secondary | ICD-10-CM | POA: Diagnosis not present

## 2017-06-23 DIAGNOSIS — D500 Iron deficiency anemia secondary to blood loss (chronic): Secondary | ICD-10-CM

## 2017-06-23 DIAGNOSIS — R0602 Shortness of breath: Secondary | ICD-10-CM | POA: Diagnosis not present

## 2017-06-23 DIAGNOSIS — A419 Sepsis, unspecified organism: Secondary | ICD-10-CM | POA: Diagnosis not present

## 2017-06-23 DIAGNOSIS — I5033 Acute on chronic diastolic (congestive) heart failure: Secondary | ICD-10-CM | POA: Diagnosis not present

## 2017-06-23 DIAGNOSIS — I2510 Atherosclerotic heart disease of native coronary artery without angina pectoris: Secondary | ICD-10-CM | POA: Diagnosis not present

## 2017-06-23 DIAGNOSIS — I130 Hypertensive heart and chronic kidney disease with heart failure and stage 1 through stage 4 chronic kidney disease, or unspecified chronic kidney disease: Secondary | ICD-10-CM | POA: Diagnosis not present

## 2017-06-23 DIAGNOSIS — R911 Solitary pulmonary nodule: Secondary | ICD-10-CM | POA: Diagnosis not present

## 2017-06-23 DIAGNOSIS — E1122 Type 2 diabetes mellitus with diabetic chronic kidney disease: Secondary | ICD-10-CM | POA: Diagnosis not present

## 2017-06-23 DIAGNOSIS — J189 Pneumonia, unspecified organism: Secondary | ICD-10-CM | POA: Diagnosis not present

## 2017-06-23 DIAGNOSIS — N289 Disorder of kidney and ureter, unspecified: Secondary | ICD-10-CM | POA: Diagnosis not present

## 2017-06-23 DIAGNOSIS — K922 Gastrointestinal hemorrhage, unspecified: Secondary | ICD-10-CM | POA: Diagnosis not present

## 2017-06-23 MED ORDER — SODIUM CHLORIDE 0.9 % IV SOLN
Freq: Once | INTRAVENOUS | Status: AC
  Administered 2017-06-23: 10:00:00 via INTRAVENOUS
  Filled 2017-06-23: qty 1000

## 2017-06-23 MED ORDER — SODIUM CHLORIDE 0.9 % IV SOLN
510.0000 mg | Freq: Once | INTRAVENOUS | Status: AC
  Administered 2017-06-23: 510 mg via INTRAVENOUS
  Filled 2017-06-23: qty 17

## 2017-06-24 DIAGNOSIS — J918 Pleural effusion in other conditions classified elsewhere: Secondary | ICD-10-CM | POA: Diagnosis not present

## 2017-06-24 DIAGNOSIS — E079 Disorder of thyroid, unspecified: Secondary | ICD-10-CM | POA: Diagnosis not present

## 2017-06-24 DIAGNOSIS — E1165 Type 2 diabetes mellitus with hyperglycemia: Secondary | ICD-10-CM | POA: Diagnosis not present

## 2017-06-24 DIAGNOSIS — I2510 Atherosclerotic heart disease of native coronary artery without angina pectoris: Secondary | ICD-10-CM | POA: Diagnosis not present

## 2017-06-24 DIAGNOSIS — J189 Pneumonia, unspecified organism: Secondary | ICD-10-CM | POA: Diagnosis not present

## 2017-06-24 DIAGNOSIS — R079 Chest pain, unspecified: Secondary | ICD-10-CM | POA: Diagnosis not present

## 2017-06-24 DIAGNOSIS — I10 Essential (primary) hypertension: Secondary | ICD-10-CM | POA: Diagnosis not present

## 2017-06-24 DIAGNOSIS — R0602 Shortness of breath: Secondary | ICD-10-CM | POA: Diagnosis not present

## 2017-06-24 DIAGNOSIS — N183 Chronic kidney disease, stage 3 (moderate): Secondary | ICD-10-CM | POA: Diagnosis not present

## 2017-06-24 DIAGNOSIS — I4891 Unspecified atrial fibrillation: Secondary | ICD-10-CM | POA: Diagnosis not present

## 2017-06-25 DIAGNOSIS — I130 Hypertensive heart and chronic kidney disease with heart failure and stage 1 through stage 4 chronic kidney disease, or unspecified chronic kidney disease: Secondary | ICD-10-CM | POA: Diagnosis not present

## 2017-06-25 DIAGNOSIS — E1122 Type 2 diabetes mellitus with diabetic chronic kidney disease: Secondary | ICD-10-CM | POA: Diagnosis not present

## 2017-06-25 DIAGNOSIS — I5033 Acute on chronic diastolic (congestive) heart failure: Secondary | ICD-10-CM | POA: Diagnosis not present

## 2017-06-25 DIAGNOSIS — A419 Sepsis, unspecified organism: Secondary | ICD-10-CM | POA: Diagnosis not present

## 2017-06-25 DIAGNOSIS — I2510 Atherosclerotic heart disease of native coronary artery without angina pectoris: Secondary | ICD-10-CM | POA: Diagnosis not present

## 2017-06-25 DIAGNOSIS — J189 Pneumonia, unspecified organism: Secondary | ICD-10-CM | POA: Diagnosis not present

## 2017-06-27 NOTE — Progress Notes (Signed)
Patient ID: Amy [name], female    DOB: August 31, 1927, 84 y.o.   MRN: [id number]  HPI  Amy [name] is a 84 y/o female with a history of ovarian cancer, carotid disease, CAD, DM, HTN, CKD, thyroid disease, anemia, GIB and chronic heart failure.   Echo report from 10/22/16 reviewed and showed an EF of 65-70%.   Admitted 05/15/17 due to pneumonia and HF. Melena noted and GI was consults. 1 unit of PCBC's given. Given IV antibiotics and she was discharged after 5 days.   She presents today for her initial visit with a chief complaint of moderate fatigue upon minimal exertion. She describes this as chronic in nature having been present for several years. She has associated shortness of breath, light-headedness, edema in left leg, easy bruising, difficulty sleeping and some depression. She denies abdominal distention, palpitations, chest pain or weight gain. She says that she has great difficulty in grocery shopping and relies on her daughter from [place] to come monthly and help get her groceries for the month. She does admit that she sometimes runs out of "some things".   Past Medical History:  Diagnosis Date  . Bladder incontinence   . CAD (coronary artery disease)    a. 05/2009 Cath: LAD 90p (Xience 2.75 x 12 mm DES), 86m, D1 40, LCx 40p/m, RCA 30/40/30p/m, RCA 30/25d;  b.  12/2011 Lexiscan MV: no ischemia, breast attenuation artifact, normal EF-->Low risk; c. 10/2016 MV: fixed apical defect, most likely apical thinning and attenuation, No ischemia, EF 60%.  . Cancer ([place])    ovarian  . Carotid arterial disease w/ R Carotid Bruit (HCC)    a. 08/2016 Carotid U/S: 40-59% bilat ICA stenosis - f/u 1 yr.  . Chronic diastolic (congestive) heart failure ([place])    a. Echo 09/2015: EF 55-60% w/ Grade 1 DD, sev Ca2+ MV annulus, mildly dil LA; b. 09/2016 Echo: EF 65-70%, Gr2 DD, mildly dil LA/RA, nl RV fxn.  . Chronic Dyspnea on exertion   . CKD (chronic kidney disease) stage 3, GFR 30-59 ml/min (HCC)   .  Degenerative arthritis of knee    bilateral knees  . Diabetes mellitus    Type II  . GIB (gastrointestinal bleeding)    a. 08/2016 Admission w/ presyncope/anemia/melena-->req Transfusion-->endo ok, colonoscopy w/ polyps but no source of bleeding (most likely diverticular).  . Hiatal hernia   . Hypertension   . Iron deficiency   . Menopausal symptoms   . Morbid obesity ([place])   . Renal insufficiency   . Thyroid disease    hypothyroidism   Past Surgical History:  Procedure Laterality Date  . COLONOSCOPY  2015  . COLONOSCOPY WITH PROPOFOL N/A 09/25/2016   Procedure: COLONOSCOPY WITH PROPOFOL;  Surgeon: [name], MD;  Location: [facility] ENDOSCOPY;  Service: Gastroenterology;  Laterality: N/A;  . COLONOSCOPY WITH PROPOFOL N/A 09/26/2016   Procedure: COLONOSCOPY WITH PROPOFOL;  Surgeon: [name], MD;  Location: [facility] ENDOSCOPY;  Service: Gastroenterology;  Laterality: N/A;  . ESOPHAGOGASTRODUODENOSCOPY (EGD) WITH PROPOFOL N/A 09/25/2016   Procedure: ESOPHAGOGASTRODUODENOSCOPY (EGD) WITH PROPOFOL;  Surgeon: [name], MD;  Location: [facility] ENDOSCOPY;  Service: Gastroenterology;  Laterality: N/A;  . REPLACEMENT TOTAL KNEE BILATERAL    . TOTAL VAGINAL HYSTERECTOMY     ovarian mass, not cancerous  . UPPER GI ENDOSCOPY  2015   Family History  Problem Relation Age of Onset  . Breast cancer Mother 75   Social History   Tobacco Use  . Smoking status: Never Smoker  .  Smokeless tobacco: Never Used  Substance Use Topics  . Alcohol use: No   Allergies  Allergen Reactions  . Losartan Other (See Comments)    Hyperkalemia  . Morphine And Related Shortness Of Breath    Rash, difficulty breathing, nausea.   . Atenolol Other (See Comments)    Other reaction(s): Other (See Comments) Decreased heart rate Decreased heart rate  . Codeine     Rash, difficulty breathing, nausea.  . Nsaids Other (See Comments)    Other reaction(s): Unknown  . Rofecoxib     Other reaction(s): Unknown  . Sucralfate  Other (See Comments)    Throat tightness  . Sulfa Antibiotics     Other reaction(s): Unknown   Prior to Admission medications   Medication Sig Start Date End Date Taking? Authorizing Provider  albuterol (PROVENTIL HFA;VENTOLIN HFA) 108 (90 Base) MCG/ACT inhaler Inhale 2 puffs into the lungs every 6 (six) hours as needed for wheezing or shortness of breath.   Yes [provider]  amLODipine (NORVASC) 10 MG tablet Take 10 mg by mouth daily.   Yes [provider]  clopidogrel (PLAVIX) 75 MG tablet Take 1 tablet (75 mg total) by mouth daily. 09/27/16  Yes Dustin Flock, MD  docusate sodium (COLACE) 100 MG capsule Take 100 mg by mouth daily as needed for mild constipation. Takes daily   Yes [provider]  doxazosin (CARDURA) 8 MG tablet TAKE ONE TABLET TWICE DAILY 03/24/17  Yes Gollan, Kathlene November, MD  furosemide (LASIX) 40 MG tablet Take 1 tablet (40 mg total) by mouth daily. 05/21/17  Yes Dustin Flock, MD  hydrALAZINE (APRESOLINE) 25 MG tablet Take 1 tablet (25 mg) by mouth two times a day. Patient taking differently: Take 25 mg by mouth. For systolic BP 500 9/38/18  Yes Gollan, Kathlene November, MD  levothyroxine (SYNTHROID, LEVOTHROID) 125 MCG tablet Take 125 mcg by mouth daily.   Yes [provider]  metFORMIN (GLUCOPHAGE) 1000 MG tablet Take 1,000 mg by mouth 2 (two) times daily with a meal.   Yes [provider]  Multiple Vitamin (MULTIVITAMIN) tablet Take 1 tablet by mouth daily.   Yes [provider]  oxybutynin (DITROPAN-XL) 5 MG 24 hr tablet Take 5 mg by mouth daily.    Yes [provider]  pantoprazole (PROTONIX) 40 MG tablet Take 1 tablet (40 mg total) by mouth daily. 05/20/17 07/19/17 Yes Dustin Flock, MD  simvastatin (ZOCOR) 40 MG tablet Take 1 tablet (40 mg total) by mouth at bedtime. Patient taking differently: Take 40 mg by mouth at bedtime. Takes in the morning 08/22/16  Yes Gollan, Kathlene November, MD  SYMBICORT 160-4.5 MCG/ACT  inhaler Inhale 2 puffs into the lungs 2 (two) times daily.  11/15/14  Yes [provider]  cloNIDine (CATAPRES - DOSED IN MG/24 HR) 0.2 mg/24hr patch Place 1 patch (0.2 mg total) onto the skin once a week. 06/29/17   Darylene Price A, FNP  ipratropium-albuterol (DUONEB) 0.5-2.5 (3) MG/3ML SOLN Take 3 mLs by nebulization every 4 (four) hours as needed (shortness of breath/wheezing.). Patient not taking: Reported on 06/29/2017 10/11/15   Henreitta Leber, MD    Review of Systems  Constitutional: Positive for fatigue (tire easily). Negative for appetite change.  HENT: Negative for congestion, postnasal drip and sore throat.   Eyes: Negative.   Respiratory: Positive for shortness of breath (easily for last couple of years). Negative for chest tightness.   Cardiovascular: Positive for leg swelling (left leg). Negative for chest pain  and palpitations.  Gastrointestinal: Negative for abdominal distention and abdominal pain.  Endocrine: Negative.   Genitourinary: Negative.   Musculoskeletal: Positive for back pain. Negative for neck pain.  Skin: Negative.   Allergic/Immunologic: Negative.   Neurological: Positive for light-headedness. Negative for dizziness.  Hematological: Negative for adenopathy. Bruises/bleeds easily.  Psychiatric/Behavioral: Positive for dysphoric mood (sometimes with death of daughter a few years ago) and sleep disturbance (wake up every few hours). The patient is not nervous/anxious.     Vitals:   06/29/17 1043  BP: (!) 150/49  Pulse: 78  Resp: 18  SpO2: 99%  Weight: 192 lb 2 oz (87.1 kg)  Height: 5\' 2"  (1.575 m)   Wt Readings from Last 3 Encounters:  06/29/17 192 lb 2 oz (87.1 kg)  06/22/17 194 lb 14.4 oz (88.4 kg)  05/28/17 197 lb (89.4 kg)   Lab Results  Component Value Date   CREATININE 1.32 (H) 05/20/2017   CREATININE 1.36 (H) 05/19/2017   CREATININE 1.30 (H) 05/18/2017    Physical Exam  Constitutional: She is oriented to person, place, and time. She  appears well-developed and well-nourished.  HENT:  Head: Normocephalic and atraumatic.  Neck: Normal range of motion. Neck supple. No JVD present.  Cardiovascular: Normal rate and regular rhythm.  Pulmonary/Chest: Effort normal. No respiratory distress. She has no wheezes. She has no rales.  Abdominal: Soft. She exhibits no distension.  Musculoskeletal:       Right lower leg: She exhibits no edema.       Left lower leg: She exhibits edema (trace edema in left lower leg).  Neurological: She is alert and oriented to person, place, and time.  Skin: Skin is warm and dry.  Psychiatric: She has a normal mood and affect. Her behavior is normal.  Nursing note and vitals reviewed.  Assessment & Plan:   1; Chronic heart failure with preserved ejection fraction- - NYHA class III - minimally fluid overloaded with pedal edema in left lower leg - already weighing daily and says that her weight has been stable. Reviewed the importance of calling for an overnight weight gain of >2 pounds or a weekly weight gain of >5 pounds - not adding salt to her food but admits that she doesn't read food labels much. Reviewed the importance of closely following a 2000mg  sodium diet and written dietary information was given to her about this.  - has great difficulty in walking even short distances  - saw cardiology Rockey Situ) 05/28/17 & returns July 2019 - sees pulmonology Ashby Dawes) 07/27/17 - BNP 10/01/15 was 53.0 - PharmD reconciled medications with the patient  2: HTN- - BP mildly elevated today - saw PCP Ola Spurr) 06/24/17 & will be seeing [name] in the future - BMP 05/20/17 reviewed and showed sodium 140, potassium 4.1 and GFR 35  3: DM- - nonfasting glucose in clinic today was 113 - A1c 04/29/17 was 6.2%  4: Anemia- - saw hematologist Grayland Ormond) 06/22/17 - receiving iron infusions ~ every 2 months  Information given to patient regarding meals on wheels as well as [employer] for home delivery or  pickup at the store. Patient says that her daughter has a computer and could order groceries for her and will discuss with her daughter.  Medication list was reviewed.  Return in 3 months or sooner for any questions/problems before then. Sees pulmonology in June and cardiology in July

## 2017-06-29 ENCOUNTER — Encounter: Payer: Self-pay | Admitting: Family

## 2017-06-29 ENCOUNTER — Ambulatory Visit: Payer: Medicare Other | Attending: Family | Admitting: Family

## 2017-06-29 VITALS — BP 150/49 | HR 78 | Resp 18 | Ht 62.0 in | Wt 192.1 lb

## 2017-06-29 DIAGNOSIS — Z6835 Body mass index (BMI) 35.0-35.9, adult: Secondary | ICD-10-CM | POA: Diagnosis not present

## 2017-06-29 DIAGNOSIS — I5032 Chronic diastolic (congestive) heart failure: Secondary | ICD-10-CM | POA: Diagnosis not present

## 2017-06-29 DIAGNOSIS — N183 Chronic kidney disease, stage 3 unspecified: Secondary | ICD-10-CM

## 2017-06-29 DIAGNOSIS — Z882 Allergy status to sulfonamides status: Secondary | ICD-10-CM | POA: Diagnosis not present

## 2017-06-29 DIAGNOSIS — D649 Anemia, unspecified: Secondary | ICD-10-CM | POA: Insufficient documentation

## 2017-06-29 DIAGNOSIS — R32 Unspecified urinary incontinence: Secondary | ICD-10-CM | POA: Diagnosis not present

## 2017-06-29 DIAGNOSIS — Z90710 Acquired absence of both cervix and uterus: Secondary | ICD-10-CM | POA: Insufficient documentation

## 2017-06-29 DIAGNOSIS — Z803 Family history of malignant neoplasm of breast: Secondary | ICD-10-CM | POA: Insufficient documentation

## 2017-06-29 DIAGNOSIS — E119 Type 2 diabetes mellitus without complications: Secondary | ICD-10-CM | POA: Insufficient documentation

## 2017-06-29 DIAGNOSIS — E039 Hypothyroidism, unspecified: Secondary | ICD-10-CM | POA: Diagnosis not present

## 2017-06-29 DIAGNOSIS — Z79890 Hormone replacement therapy (postmenopausal): Secondary | ICD-10-CM | POA: Diagnosis not present

## 2017-06-29 DIAGNOSIS — I2510 Atherosclerotic heart disease of native coronary artery without angina pectoris: Secondary | ICD-10-CM | POA: Insufficient documentation

## 2017-06-29 DIAGNOSIS — Z79899 Other long term (current) drug therapy: Secondary | ICD-10-CM | POA: Diagnosis not present

## 2017-06-29 DIAGNOSIS — Z888 Allergy status to other drugs, medicaments and biological substances status: Secondary | ICD-10-CM | POA: Insufficient documentation

## 2017-06-29 DIAGNOSIS — Z8543 Personal history of malignant neoplasm of ovary: Secondary | ICD-10-CM | POA: Insufficient documentation

## 2017-06-29 DIAGNOSIS — I130 Hypertensive heart and chronic kidney disease with heart failure and stage 1 through stage 4 chronic kidney disease, or unspecified chronic kidney disease: Secondary | ICD-10-CM | POA: Diagnosis not present

## 2017-06-29 DIAGNOSIS — Z7984 Long term (current) use of oral hypoglycemic drugs: Secondary | ICD-10-CM | POA: Diagnosis not present

## 2017-06-29 DIAGNOSIS — Z7951 Long term (current) use of inhaled steroids: Secondary | ICD-10-CM | POA: Insufficient documentation

## 2017-06-29 DIAGNOSIS — Z96653 Presence of artificial knee joint, bilateral: Secondary | ICD-10-CM | POA: Diagnosis not present

## 2017-06-29 DIAGNOSIS — Z7902 Long term (current) use of antithrombotics/antiplatelets: Secondary | ICD-10-CM | POA: Diagnosis not present

## 2017-06-29 DIAGNOSIS — Z886 Allergy status to analgesic agent status: Secondary | ICD-10-CM | POA: Diagnosis not present

## 2017-06-29 DIAGNOSIS — E1122 Type 2 diabetes mellitus with diabetic chronic kidney disease: Secondary | ICD-10-CM | POA: Diagnosis not present

## 2017-06-29 DIAGNOSIS — I10 Essential (primary) hypertension: Secondary | ICD-10-CM

## 2017-06-29 DIAGNOSIS — Z885 Allergy status to narcotic agent status: Secondary | ICD-10-CM | POA: Diagnosis not present

## 2017-06-29 DIAGNOSIS — I509 Heart failure, unspecified: Secondary | ICD-10-CM | POA: Diagnosis present

## 2017-06-29 LAB — GLUCOSE, CAPILLARY: GLUCOSE-CAPILLARY: 113 mg/dL — AB (ref 65–99)

## 2017-06-29 MED ORDER — CLONIDINE 0.2 MG/24HR TD PTWK: 0.2000 mg | MEDICATED_PATCH | TRANSDERMAL | 12 refills | Status: DC

## 2017-06-29 NOTE — Patient Instructions (Addendum)
Continue weighing daily and call for an overnight weight gain of > 2 pounds or a weekly weight gain of >5 pounds.   [place], [place], [place] [address]  [phone] [employer] has home delivery and Store Pick Up. Annual charge is $ 99.00 for store pick up or $9.95 per delivery to your home  Store phone: [phone]

## 2017-06-30 DIAGNOSIS — A419 Sepsis, unspecified organism: Secondary | ICD-10-CM | POA: Diagnosis not present

## 2017-06-30 DIAGNOSIS — I2510 Atherosclerotic heart disease of native coronary artery without angina pectoris: Secondary | ICD-10-CM | POA: Diagnosis not present

## 2017-06-30 DIAGNOSIS — I130 Hypertensive heart and chronic kidney disease with heart failure and stage 1 through stage 4 chronic kidney disease, or unspecified chronic kidney disease: Secondary | ICD-10-CM | POA: Diagnosis not present

## 2017-06-30 DIAGNOSIS — I5033 Acute on chronic diastolic (congestive) heart failure: Secondary | ICD-10-CM | POA: Diagnosis not present

## 2017-06-30 DIAGNOSIS — E1122 Type 2 diabetes mellitus with diabetic chronic kidney disease: Secondary | ICD-10-CM | POA: Diagnosis not present

## 2017-06-30 DIAGNOSIS — J189 Pneumonia, unspecified organism: Secondary | ICD-10-CM | POA: Diagnosis not present

## 2017-07-01 DIAGNOSIS — E1122 Type 2 diabetes mellitus with diabetic chronic kidney disease: Secondary | ICD-10-CM | POA: Diagnosis not present

## 2017-07-01 DIAGNOSIS — J189 Pneumonia, unspecified organism: Secondary | ICD-10-CM | POA: Diagnosis not present

## 2017-07-01 DIAGNOSIS — I5033 Acute on chronic diastolic (congestive) heart failure: Secondary | ICD-10-CM | POA: Diagnosis not present

## 2017-07-01 DIAGNOSIS — I2510 Atherosclerotic heart disease of native coronary artery without angina pectoris: Secondary | ICD-10-CM | POA: Diagnosis not present

## 2017-07-01 DIAGNOSIS — A419 Sepsis, unspecified organism: Secondary | ICD-10-CM | POA: Diagnosis not present

## 2017-07-01 DIAGNOSIS — I130 Hypertensive heart and chronic kidney disease with heart failure and stage 1 through stage 4 chronic kidney disease, or unspecified chronic kidney disease: Secondary | ICD-10-CM | POA: Diagnosis not present

## 2017-07-06 ENCOUNTER — Other Ambulatory Visit: Payer: Self-pay

## 2017-07-06 ENCOUNTER — Ambulatory Visit (INDEPENDENT_AMBULATORY_CARE_PROVIDER_SITE_OTHER): Payer: Medicare Other | Admitting: Gastroenterology

## 2017-07-06 ENCOUNTER — Encounter: Payer: Self-pay | Admitting: Gastroenterology

## 2017-07-06 VITALS — BP 153/63 | HR 73 | Ht 62.0 in | Wt 194.6 lb

## 2017-07-06 DIAGNOSIS — D500 Iron deficiency anemia secondary to blood loss (chronic): Secondary | ICD-10-CM | POA: Diagnosis not present

## 2017-07-07 DIAGNOSIS — J189 Pneumonia, unspecified organism: Secondary | ICD-10-CM | POA: Diagnosis not present

## 2017-07-07 DIAGNOSIS — I130 Hypertensive heart and chronic kidney disease with heart failure and stage 1 through stage 4 chronic kidney disease, or unspecified chronic kidney disease: Secondary | ICD-10-CM | POA: Diagnosis not present

## 2017-07-07 DIAGNOSIS — A419 Sepsis, unspecified organism: Secondary | ICD-10-CM | POA: Diagnosis not present

## 2017-07-07 DIAGNOSIS — E1122 Type 2 diabetes mellitus with diabetic chronic kidney disease: Secondary | ICD-10-CM | POA: Diagnosis not present

## 2017-07-07 DIAGNOSIS — I2510 Atherosclerotic heart disease of native coronary artery without angina pectoris: Secondary | ICD-10-CM | POA: Diagnosis not present

## 2017-07-07 DIAGNOSIS — I5033 Acute on chronic diastolic (congestive) heart failure: Secondary | ICD-10-CM | POA: Diagnosis not present

## 2017-07-08 NOTE — Progress Notes (Signed)
Amy Antigua, MD [address]  [place]  [place],  [address]  Main: [phone]  Fax: [phone]   Primary Care Physician: Leonel Ramsay, MD  Primary Gastroenterologist:  Dr. Vonda [name]  Chief Complaint  Patient presents with  . Follow-up    ED-rectal bleeding  . Establish Care    HPI: Amy [name] is a 84 y.o. female here for hospital follow-up.  Patient was admitted to the hospital in March 2019, and GI was consulted for anemia and melena.  She was admitted due to pneumonia and sepsis at this time.  She was also on Plavix at home due to her prior history of CAD at the time of admission.  This was held during admission, and has not been resumed by her cardiologist.  Her aspirin that she was on previously has not been continued.  During the hospital admission, an EGD was planned, but anesthesia staff did not think that the patient was optimized for an EGD at the time and did not recommend proceeding with the procedure.  Patient was subsequently discharged by the hospital staff prior to medical optimization with EGD.  Patient denies any melena since discharge from the hospital.  Denies any abdominal pain, heartburn, dysphagia, altered bowel habits, melena or hematochezia.  Previous history: Patient had a EGD and colonoscopy in August 2018 by Dr. Vicente Males for melena.  EGD was normal except for a hiatal hernia.  Colonoscopy showed diverticulosis,  nonbleeding internal hemorrhoids, 4 polyps were removed and showed tubular adenoma and 2 of the polyps did not show any adenoma.  As per result note by Dr. Vicente Males  "Notes recorded by [name], MD on 10/01/2016 at 12:02 PM EDT Inform pre cancerous polyps taken out- wouldn't recommend future colonoscopy for surveillance due to age"  As per [facility] GI note from July 2018 "Colonoscopy 06/30/13: 4 tubular adenomas, diverticulosis, internal hemorrhoids EGD 06/30/13: moderate hiatal hernia, gastritis  Capsule  endoscopy: normal per documented phone call to the patient"    Current Outpatient Medications  Medication Sig Dispense Refill  . albuterol (PROVENTIL HFA;VENTOLIN HFA) 108 (90 Base) MCG/ACT inhaler Inhale 2 puffs into the lungs every 6 (six) hours as needed for wheezing or shortness of breath.    [place] Kitchen amLODipine (NORVASC) 10 MG tablet Take 10 mg by mouth daily.    . cloNIDine (CATAPRES - DOSED IN MG/24 HR) 0.2 mg/24hr patch Place 1 patch (0.2 mg total) onto the skin once a week. 4 patch 12  . clopidogrel (PLAVIX) 75 MG tablet Take 1 tablet (75 mg total) by mouth daily. 90 tablet 3  . docusate sodium (COLACE) 100 MG capsule Take 100 mg by mouth daily as needed for mild constipation. Takes daily    . doxazosin (CARDURA) 8 MG tablet TAKE ONE TABLET TWICE DAILY 180 tablet 3  . furosemide (LASIX) 40 MG tablet Take 1 tablet (40 mg total) by mouth daily. 30 tablet 2  . hydrALAZINE (APRESOLINE) 25 MG tablet Take 1 tablet (25 mg) by mouth two times a day. (Patient taking differently: Take 25 mg by mouth. For systolic BP 631) 497 tablet 2  . ipratropium-albuterol (DUONEB) 0.5-2.5 (3) MG/3ML SOLN Take 3 mLs by nebulization every 4 (four) hours as needed (shortness of breath/wheezing.). 360 mL   . levothyroxine (SYNTHROID, LEVOTHROID) 125 MCG tablet Take 125 mcg by mouth daily.    . metFORMIN (GLUCOPHAGE) 1000 MG tablet Take 1,000 mg by mouth 2 (two) times daily with a meal.    .  Multiple Vitamin (MULTIVITAMIN) tablet Take 1 tablet by mouth daily.    [place] Kitchen oxybutynin (DITROPAN-XL) 5 MG 24 hr tablet Take 5 mg by mouth daily.     . simvastatin (ZOCOR) 40 MG tablet Take 1 tablet (40 mg total) by mouth at bedtime. (Patient taking differently: Take 40 mg by mouth at bedtime. Takes in the morning) 90 tablet 3  . SYMBICORT 160-4.5 MCG/ACT inhaler Inhale 2 puffs into the lungs 2 (two) times daily.     . pantoprazole (PROTONIX) 40 MG tablet Take 1 tablet (40 mg total) by mouth daily. (Patient not taking: Reported on  07/06/2017) 30 tablet 1   No current facility-administered medications for this visit.    Facility-Administered Medications Ordered in Other Visits  Medication Dose Route Frequency Provider Last Rate Last Dose  . 0.9 %  sodium chloride infusion   Intravenous Continuous Lloyd Huger, MD      . ferumoxytol [facility]) 510 mg in sodium chloride 0.9 % 100 mL IVPB  510 mg Intravenous Once Lloyd Huger, MD        Allergies as of 07/06/2017 - Review Complete 07/06/2017  Allergen Reaction Noted  . Losartan Other (See Comments) 02/11/2017  . Morphine and related Shortness Of Breath 06/24/2010  . Atenolol Other (See Comments) 07/11/2013  . Codeine  06/24/2010  . Nsaids Other (See Comments) 07/11/2013  . Rofecoxib    . Sucralfate Other (See Comments) 07/11/2013  . Sulfa antibiotics  07/11/2013    ROS:  General: Negative for anorexia, weight loss, fever, chills, fatigue, weakness. ENT: Negative for hoarseness, difficulty swallowing , nasal congestion. CV: Negative for chest pain, angina, palpitations, dyspnea on exertion, peripheral edema.  Respiratory: Negative for dyspnea at rest, dyspnea on exertion, cough, sputum, wheezing.  GI: See history of present illness. GU:  Negative for dysuria, hematuria, urinary incontinence, urinary frequency, nocturnal urination.  Endo: Negative for unusual weight change.    Physical Examination:   BP (!) 153/63   Pulse 73   Ht 5\' 2"  (1.575 m)   Wt 194 lb 9.6 oz (88.3 kg)   BMI 35.59 kg/m   General: Well-nourished, well-developed in no acute distress.  Eyes: No icterus. Conjunctivae pink. Mouth: Oropharyngeal mucosa moist and pink , no lesions erythema or exudate. Neck: Supple, Trachea midline Abdomen: Bowel sounds are normal, nontender, nondistended, no hepatosplenomegaly or masses, no abdominal bruits or hernia , no rebound or guarding.   Extremities: No lower extremity edema. No clubbing or deformities. Neuro: Alert and oriented x 3.   Grossly intact. Skin: Warm and dry, no jaundice.   Psych: Alert and cooperative, normal mood and affect.   Labs: CMP     Component Value Date/Time   NA 140 05/20/2017 0431   NA 145 (H) 03/12/2017 1121   NA 144 10/15/2013 0415   K 4.1 05/20/2017 0431   K 3.9 10/15/2013 0415   CL 103 05/20/2017 0431   CL 113 (H) 10/15/2013 0415   CO2 26 05/20/2017 0431   CO2 23 10/15/2013 0415   GLUCOSE 146 (H) 05/20/2017 0431   GLUCOSE 141 (H) 10/15/2013 0415   BUN 18 05/20/2017 0431   BUN 23 03/12/2017 1121   BUN 12 10/15/2013 0415   CREATININE 1.32 (H) 05/20/2017 0431   CREATININE 1.10 10/15/2013 0415   CALCIUM 7.8 (L) 05/20/2017 0431   CALCIUM 7.5 (L) 10/15/2013 0415   PROT 5.9 (L) 05/15/2017 2134   PROT 6.2 (L) 10/11/2013 1835   ALBUMIN 2.9 (L) 05/15/2017 2134  ALBUMIN 2.8 (L) 10/11/2013 1835   AST 26 05/15/2017 2134   AST 30 10/11/2013 1835   ALT 11 (L) 05/15/2017 2134   ALT 30 10/11/2013 1835   ALKPHOS 70 05/15/2017 2134   ALKPHOS 69 10/11/2013 1835   BILITOT 0.5 05/15/2017 2134   BILITOT 0.2 10/11/2013 1835   GFRNONAA 35 (L) 05/20/2017 0431   GFRNONAA 46 (L) 10/15/2013 0415   GFRAA 40 (L) 05/20/2017 0431   GFRAA 53 (L) 10/15/2013 0415   Lab Results  Component Value Date   WBC 5.8 06/22/2017   HGB 10.8 (L) 06/22/2017   HCT 32.3 (L) 06/22/2017   MCV 90.9 06/22/2017   PLT 164 06/22/2017    Imaging Studies: No results found.  Assessment and Plan:   Amy [name] is a 84 y.o. y/o female admitted to the hospital in March 2019 due to pneumonia and sepsis, and found to have anemia and melena in the setting of aspirin and Plavix use with lowest hemoglobin being 7.5 during that admission  EGD was scheduled during the hospital admission, but anesthesia staff did not think that the patient was optimized at the EGD at that time, and then hospital staff subsequently discharged the patient home. Patient denies any further melena or abdominal pain since discharge Hemoglobin  is now 10.8 as of April 29 Her aspirin has been discontinued, and she is only on Plavix  She has had previous endoscopies as stated in HPI due to melena and anemia with source of melena not identified.  She may have underlying AVMs that were not seen on capsule endoscopy She is receiving IV iron transfusion by hematology, and should continue follow-up with them  I have discussed an EGD with her, to rule out any upper GI lesions, such as ulcers However, since she has not had any melena or abdominal pain, patient would like to hold off on any procedures at this time.  She understands the risks of underlying lesions such as cancers or ulcers, but given her age, would not like to proceed with any procedures unless she has symptoms.  Since AVMs might be the source of her melena and anemia (given that the symptoms were present during her last colonoscopy and EGD, and a source was not determined, and she has had capsule endoscopy for this in the past as well), as suggested by her previous work-up, EGD may not reveal source of the melena that occurred during her hospital stay.  Best treatment for stable AVMs, that are not acutely bleeding, is IV iron transfusions, that she is on.  Continue to avoid NSAIDs If she has any melena, abdominal pain, blood in stool, or any other symptoms of concern, she was asked to call us or go to the ER immediately She verbalized understanding  I have asked her to follow-up with her primary care physician closely as well We will have her follow-up in clinic with [place] as well to monitor clinical course  Dr Amy [name]

## 2017-07-09 DIAGNOSIS — E538 Deficiency of other specified B group vitamins: Secondary | ICD-10-CM | POA: Diagnosis not present

## 2017-07-09 DIAGNOSIS — I5033 Acute on chronic diastolic (congestive) heart failure: Secondary | ICD-10-CM | POA: Diagnosis not present

## 2017-07-09 DIAGNOSIS — E1122 Type 2 diabetes mellitus with diabetic chronic kidney disease: Secondary | ICD-10-CM | POA: Diagnosis not present

## 2017-07-09 DIAGNOSIS — I130 Hypertensive heart and chronic kidney disease with heart failure and stage 1 through stage 4 chronic kidney disease, or unspecified chronic kidney disease: Secondary | ICD-10-CM | POA: Diagnosis not present

## 2017-07-09 DIAGNOSIS — J189 Pneumonia, unspecified organism: Secondary | ICD-10-CM | POA: Diagnosis not present

## 2017-07-09 DIAGNOSIS — I2510 Atherosclerotic heart disease of native coronary artery without angina pectoris: Secondary | ICD-10-CM | POA: Diagnosis not present

## 2017-07-09 DIAGNOSIS — A419 Sepsis, unspecified organism: Secondary | ICD-10-CM | POA: Diagnosis not present

## 2017-07-13 DIAGNOSIS — H353211 Exudative age-related macular degeneration, right eye, with active choroidal neovascularization: Secondary | ICD-10-CM | POA: Diagnosis not present

## 2017-07-13 DIAGNOSIS — H353222 Exudative age-related macular degeneration, left eye, with inactive choroidal neovascularization: Secondary | ICD-10-CM | POA: Diagnosis not present

## 2017-07-17 DIAGNOSIS — A419 Sepsis, unspecified organism: Secondary | ICD-10-CM | POA: Diagnosis not present

## 2017-07-17 DIAGNOSIS — I130 Hypertensive heart and chronic kidney disease with heart failure and stage 1 through stage 4 chronic kidney disease, or unspecified chronic kidney disease: Secondary | ICD-10-CM | POA: Diagnosis not present

## 2017-07-17 DIAGNOSIS — I5033 Acute on chronic diastolic (congestive) heart failure: Secondary | ICD-10-CM | POA: Diagnosis not present

## 2017-07-17 DIAGNOSIS — I2510 Atherosclerotic heart disease of native coronary artery without angina pectoris: Secondary | ICD-10-CM | POA: Diagnosis not present

## 2017-07-17 DIAGNOSIS — E1122 Type 2 diabetes mellitus with diabetic chronic kidney disease: Secondary | ICD-10-CM | POA: Diagnosis not present

## 2017-07-17 DIAGNOSIS — J189 Pneumonia, unspecified organism: Secondary | ICD-10-CM | POA: Diagnosis not present

## 2017-07-23 ENCOUNTER — Ambulatory Visit (INDEPENDENT_AMBULATORY_CARE_PROVIDER_SITE_OTHER): Payer: Medicare Other | Admitting: Podiatry

## 2017-07-23 ENCOUNTER — Encounter: Payer: Self-pay | Admitting: Podiatry

## 2017-07-23 DIAGNOSIS — M79676 Pain in unspecified toe(s): Secondary | ICD-10-CM | POA: Diagnosis not present

## 2017-07-23 DIAGNOSIS — B351 Tinea unguium: Secondary | ICD-10-CM | POA: Diagnosis not present

## 2017-07-23 DIAGNOSIS — L608 Other nail disorders: Secondary | ICD-10-CM

## 2017-07-23 DIAGNOSIS — D689 Coagulation defect, unspecified: Secondary | ICD-10-CM

## 2017-07-23 DIAGNOSIS — E119 Type 2 diabetes mellitus without complications: Secondary | ICD-10-CM

## 2017-07-23 NOTE — Progress Notes (Signed)
Complaint:  Visit Type: Patient returns to my office for continued preventative foot care services. Complaint: Patient states" my nails have grown long and thick and become painful to walk and wear shoes" Patient has been diagnosed with DM with no foot complications. The patient presents for preventative foot care services. No changes to ROS.  Patient is taking plavix.  Podiatric Exam: Vascular: dorsalis pedis and posterior tibial pulses are palpable bilateral. Capillary return is immediate. Temperature gradient is WNL. Skin turgor WNL  Sensorium: Normal Semmes Weinstein monofilament test. Normal tactile sensation bilaterally. Nail Exam: Pt has thick disfigured discolored nails with subungual debris noted bilateral entire nail hallux through fifth toenails Ulcer Exam: There is no evidence of ulcer or pre-ulcerative changes or infection. Orthopedic Exam: Muscle tone and strength are WNL. No limitations in general ROM. No crepitus or effusions noted. Foot type and digits show no abnormalities. Bony prominences are unremarkable. Skin: No Porokeratosis. No infection or ulcers  Diagnosis:  Onychomycosis, , Pain in right toe, pain in left toes  Treatment & Plan Procedures and Treatment: Consent by patient was obtained for treatment procedures. The patient understood the discussion of treatment and procedures well. All questions were answered thoroughly reviewed. Debridement of mycotic and hypertrophic toenails, 1 through 5 bilateral and clearing of subungual debris. No ulceration, no infection noted. ABN signed for 2019. Return Visit-Office Procedure: Patient instructed to return to the office for a follow up visit 10 weeks  for continued evaluation and treatment.    Gardiner Barefoot DPM

## 2017-07-26 NOTE — Progress Notes (Signed)
* [facility]     Assessment and Plan:  Dyspnea. -The patient's dyspnea is likely multifactorial from cardiomegaly with chronic diastolic congestive heart failure, bronchitis, atelectasis, morbid obesity, deconditioning. -- We Discussed again today that her breathing issues are chronic, there is no particular "fix" for this. Our focus will be to try to keep her function where it is at, and try to allow her to live independently for as long as possible.  -She has completed PT and pulm rehab, she continues at pulm rehab.    Atelectasis/Lung nodule.  -She was encouraged to use her incentive spirometry and flutter valve. Continue PT.  -Given the presence of lung nodules, atelectasis, paratracheal lymphadenopathy,  given advanced age, multiple comorbidites would not recommend further workup.   Asthma.  -Continue symbicort bid.  --s/p Prevnar-13 vaccine.  Hiatal hernia/intrathoracic stomach. -Patient has a significant hiatal hernia, which is likely contributing to dyspnea and atelectasis. -The patient is likely at high risk of aspiration, continue to treat with antireflux measures. -Continue Protonix.   CHF with cardiomegaly. -Patient has significant cardiomegaly noted on CT of the chest, this likely contributing to atelectasis.  Obesity. -BMI = 46 -Likely contributing to dyspnea, weight loss would be beneficial for her dyspnea. -Obesity is likely contributing to atelectasis due to external compression from chest wall and diaphragm.   Advanced Care Planning 11/26/16:  Pt reaffirms that she would not want to undergo chemotherapy, a CT scan has been ordered, she is agreement with this.  She thinks that if something happened to her she would not want to be put on life support.      Date: 07/26/2017  MRN# [id number] Amy [name] 1927/11/21   Amy [name] is a 84 y.o. old female seen in follow up for chief complaint of  Chief Complaint  Patient  presents with  . Follow-up    SOB at all times: chest tightness:     HPI:   Patient is a 84 year old female, with multifactorial dyspnea including chronic bronchitis, congestive heart failure, chronic debility, hiatal hernia, obesity, atelectasis, congestive heart failure. She was admitted in March for 5 days for pneumonia and sepsis. She notes that her breathing continues to be short with with minimal activity. She takes symbicort 2 puffs bid, she takes ventolin about once per day as needed, not sure if it helps.  She lives with her daughter in a guest house. She does most of her ADL's. She does drive but she no longer walks a grocery store due to fatigue and dyspnea.   **Imaging personally reviewed, CT chest 01/13/2017; comparison with previous images including CT chest 10/09/15 continue to be bilateral lung nodules, and pleural thickening, however findings are otherwise unchanged. **desat walk 11/12/15: Baseline oxygen sat today on RA at rest 96% and HR 98. After walking 20 feet pt sat was 97% and HR 108, significant dyspnea, unsteady gait, needed assistance when walking. Took a few minutes for breathing to recover.   Medication:   Outpatient Encounter Medications as of 07/27/2017  Medication Sig  . albuterol (PROVENTIL HFA;VENTOLIN HFA) 108 (90 Base) MCG/ACT inhaler Inhale 2 puffs into the lungs every 6 (six) hours as needed for wheezing or shortness of breath.  Marland Kitchen amLODipine (NORVASC) 10 MG tablet Take 10 mg by mouth daily.  . cloNIDine (CATAPRES - DOSED IN MG/24 HR) 0.2 mg/24hr patch Place 1 patch (0.2 mg total) onto the skin once a week.  . clopidogrel (PLAVIX) 75 MG tablet Take 1 tablet (75 mg  total) by mouth daily.  Marland Kitchen docusate sodium (COLACE) 100 MG capsule Take 100 mg by mouth daily as needed for mild constipation. Takes daily  . doxazosin (CARDURA) 8 MG tablet TAKE ONE TABLET TWICE DAILY  . furosemide (LASIX) 40 MG tablet Take 1 tablet (40 mg total) by mouth daily.  . hydrALAZINE  (APRESOLINE) 25 MG tablet Take 1 tablet (25 mg) by mouth two times a day. (Patient taking differently: Take 25 mg by mouth. For systolic BP 786)  . ipratropium-albuterol (DUONEB) 0.5-2.5 (3) MG/3ML SOLN Take 3 mLs by nebulization every 4 (four) hours as needed (shortness of breath/wheezing.).  Marland Kitchen levothyroxine (SYNTHROID, LEVOTHROID) 125 MCG tablet Take 125 mcg by mouth daily.  . metFORMIN (GLUCOPHAGE) 1000 MG tablet Take 1,000 mg by mouth 2 (two) times daily with a meal.  . Multiple Vitamin (MULTIVITAMIN) tablet Take 1 tablet by mouth daily.  Marland Kitchen oxybutynin (DITROPAN-XL) 5 MG 24 hr tablet Take 5 mg by mouth daily.   . pantoprazole (PROTONIX) 40 MG tablet Take 1 tablet (40 mg total) by mouth daily. (Patient not taking: Reported on 07/06/2017)  . simvastatin (ZOCOR) 40 MG tablet Take 1 tablet (40 mg total) by mouth at bedtime. (Patient taking differently: Take 40 mg by mouth at bedtime. Takes in the morning)  . SYMBICORT 160-4.5 MCG/ACT inhaler Inhale 2 puffs into the lungs 2 (two) times daily.    Facility-Administered Encounter Medications as of 07/27/2017  Medication  . 0.9 %  sodium chloride infusion  . ferumoxytol (FERAHEME) 510 mg in sodium chloride 0.9 % 100 mL IVPB     Allergies:  Losartan; Morphine and related; Atenolol; Codeine; Nsaids; Rofecoxib; Sucralfate; and Sulfa antibiotics  Review of Systems: Gen:  Denies  fever, sweats. HEENT: Denies blurred vision. Cvc:  No dizziness, chest pain or heaviness Resp:   Denies cough or sputum porduction. Gi: Denies swallowing difficulty, stomach pain. constipation, bowel incontinence Gu:  Denies bladder incontinence, burning urine Ext:   No Joint pain, stiffness. Skin: No skin rash, easy bruising. Endoc:  No polyuria, polydipsia. Psych: No depression, insomnia. Other:  All other systems were reviewed and found to be negative other than what is mentioned in the HPI.   Physical Examination:   VS: Ht 5\' 2"  (1.575 m)   Wt 188 lb (85.3 kg)    BMI 34.39 kg/m  Blood pressure reviewed. General Appearance: No distress  Neuro:without focal findings,  speech normal,  HEENT: PERRLA, EOM intact. Pulmonary: normal breath sounds, No wheezing.   CardiovascularNormal S1,S2.  No m/r/g.   Abdomen: Benign, Soft, non-tender. Renal:  No costovertebral tenderness  GU:  Not performed at this time. Endoc: No evident thyromegaly, no signs of acromegaly. Skin:   warm, no rash. Extremities: normal, no cyanosis, clubbing.   LABORATORY PANEL:   CBC No results for input(s): WBC, HGB, HCT, PLT in the last 168 hours. ------------------------------------------------------------------------------------------------------------------  Chemistries  No results for input(s): NA, K, CL, CO2, GLUCOSE, BUN, CREATININE, CALCIUM, MG, AST, ALT, ALKPHOS, BILITOT in the last 168 hours.  Invalid input(s): GFRCGP ------------------------------------------------------------------------------------------------------------------  Cardiac Enzymes No results for input(s): TROPONINI in the last 168 hours. ------------------------------------------------------------  RADIOLOGY:   No results found for this or any previous visit. Results for orders placed during the hospital encounter of 12/28/14  DG Chest 2 View   Narrative CLINICAL DATA:  Hemoptysis this morning ; shortness of breath on exertion, history of asthma, chronic CHF, obesity, and diabetes.  EXAM: CHEST  2 VIEW  COMPARISON:  Portable chest x-ray of  October 11, 2013  FINDINGS: The lungs are well-expanded. There is no focal infiltrate. The interstitial markings are coarse. The heart is top-normal in size. The pulmonary vascularity is not engorged. There is no pleural effusion. There is a moderate-sized hiatal hernia. There is apical pleural thickening bilaterally. There is mild degenerative disc space narrowing of the mid thoracic spine.  IMPRESSION: Acute on chronic bronchitic change.  There is  no evidence of CHF.   Electronically Signed   By: David  Martinique M.D.   On: 12/28/2014 13:21    ------------------------------------------------------------------------------------------------------------------  Thank  you for allowing [facility] to assist in the care of your patient. Our recommendations are noted above.  Please contact us if we can be of further service.   Marda Stalker, MD.  [facility] Office Number: [phone]  Patricia Pesa, M.D.  Vilinda Boehringer, M.D.  Merton Border, M.D  07/26/2017

## 2017-07-27 ENCOUNTER — Encounter: Payer: Self-pay | Admitting: Internal Medicine

## 2017-07-27 ENCOUNTER — Ambulatory Visit (INDEPENDENT_AMBULATORY_CARE_PROVIDER_SITE_OTHER): Payer: Medicare Other | Admitting: Internal Medicine

## 2017-07-27 VITALS — Ht 62.0 in | Wt 188.0 lb

## 2017-07-27 DIAGNOSIS — J9610 Chronic respiratory failure, unspecified whether with hypoxia or hypercapnia: Secondary | ICD-10-CM | POA: Diagnosis not present

## 2017-07-27 DIAGNOSIS — I2510 Atherosclerotic heart disease of native coronary artery without angina pectoris: Secondary | ICD-10-CM

## 2017-07-27 NOTE — Patient Instructions (Signed)
Continue symbicort twice daily. Continue ventolin as needed.

## 2017-08-10 DIAGNOSIS — E538 Deficiency of other specified B group vitamins: Secondary | ICD-10-CM | POA: Diagnosis not present

## 2017-08-17 NOTE — Progress Notes (Signed)
[facility]  [phone] [phone]) [phone]  ID: Amy [name] Bertha OB: 03-24-1927  MR#: [id number]  FGH#:[id number]  Patient Care Team: Leonel Ramsay, MD as PCP - General (Infectious Diseases) Rockey Situ Kathlene November, MD as PCP - Cardiology (Cardiology) Minna Merritts, MD as Consulting Physician (Cardiology)  CHIEF COMPLAINT: Iron deficiency anemia secondary to chronic blood loss.  INTERVAL HISTORY: Patient returns to clinic today for repeat laboratory work and further evaluation.  She feels improved and nearly back to her baseline.  She has not noted any further GI bleeds or changes in her bowel movements.  She does not complain of weakness or fatigue today.  She has no neurologic complaints. She denies any recent fevers or illnesses. She denies any chest pain, cough, or hemoptysis.  She reports continued chronic shortness of breath.  She denies any nausea, vomiting, constipation, or diarrhea.  She denies any further melena or hematochezia.  She has no urinary complaints.  Patient offers no specific complaints today.  REVIEW OF SYSTEMS:   Review of Systems  Constitutional: Negative.  Negative for fever, malaise/fatigue and weight loss.  HENT: Negative.   Respiratory: Positive for shortness of breath. Negative for cough and hemoptysis.   Cardiovascular: Negative.  Negative for chest pain and leg swelling.  Gastrointestinal: Negative.  Negative for abdominal pain, blood in stool and melena.  Genitourinary: Negative.  Negative for hematuria.  Musculoskeletal: Negative.  Negative for back pain.  Skin: Negative.  Negative for rash.  Neurological: Negative.  Negative for sensory change, focal weakness and weakness.  Psychiatric/Behavioral: Negative.  The patient is not nervous/anxious.     As per HPI. Otherwise, a complete review of systems is negative.  PAST MEDICAL HISTORY: Past Medical History:  Diagnosis Date  . Bladder incontinence   . CAD  (coronary artery disease)    a. 05/2009 Cath: LAD 90p (Xience 2.75 x 12 mm DES), 62m, D1 40, LCx 40p/m, RCA 30/40/30p/m, RCA 30/25d;  b.  12/2011 Lexiscan MV: no ischemia, breast attenuation artifact, normal EF-->Low risk; c. 10/2016 MV: fixed apical defect, most likely apical thinning and attenuation, No ischemia, EF 60%.  . Cancer ([place])    ovarian  . Carotid arterial disease w/ R Carotid Bruit (HCC)    a. 08/2016 Carotid U/S: 40-59% bilat ICA stenosis - f/u 1 yr.  . Chronic diastolic (congestive) heart failure ([place])    a. Echo 09/2015: EF 55-60% w/ Grade 1 DD, sev Ca2+ MV annulus, mildly dil LA; b. 09/2016 Echo: EF 65-70%, Gr2 DD, mildly dil LA/RA, nl RV fxn.  . Chronic Dyspnea on exertion   . CKD (chronic kidney disease) stage 3, GFR 30-59 ml/min (HCC)   . Degenerative arthritis of knee    bilateral knees  . Diabetes mellitus    Type II  . GIB (gastrointestinal bleeding)    a. 08/2016 Admission w/ presyncope/anemia/melena-->req Transfusion-->endo ok, colonoscopy w/ polyps but no source of bleeding (most likely diverticular).  . Hiatal hernia   . Hypertension   . Iron deficiency   . Menopausal symptoms   . Morbid obesity ([place])   . Renal insufficiency   . Thyroid disease    hypothyroidism    PAST SURGICAL HISTORY: Past Surgical History:  Procedure Laterality Date  . COLONOSCOPY  2015  . COLONOSCOPY WITH PROPOFOL N/A 09/25/2016   Procedure: COLONOSCOPY WITH PROPOFOL;  Surgeon: [name], MD;  [facility] END[facility]COPY;  Service: Gastroenterology;  Laterality: N/A;  . COLONOSCOPY WITH PROPOFOL N/A 09/26/2016   Procedure: COLONOSCOPY  WITH PROPOFOL;  Surgeon: [name], MD;  Location: [facility] ENDOSCOPY;  Service: Gastroenterology;  Laterality: N/A;  . ESOPHAGOGASTRODUODENOSCOPY (EGD) WITH PROPOFOL N/A 09/25/2016   Procedure: ESOPHAGOGASTRODUODENOSCOPY (EGD) WITH PROPOFOL;  Surgeon: [name], MD;  Location: [facility] ENDOSCOPY;  Service: Gastroenterology;  Laterality: N/A;  . REPLACEMENT TOTAL  KNEE BILATERAL    . TOTAL VAGINAL HYSTERECTOMY     ovarian mass, not cancerous  . UPPER GI ENDOSCOPY  2015    FAMILY HISTORY Family History  Problem Relation Age of Onset  . Breast cancer Mother 51       ADVANCED DIRECTIVES:    HEALTH MAINTENANCE: Social History   Tobacco Use  . Smoking status: Never Smoker  . Smokeless tobacco: Never Used  Substance Use Topics  . Alcohol use: No  . Drug use: No     Colonoscopy:  PAP:  Bone density:  Lipid panel:  Allergies  Allergen Reactions  . Losartan Other (See Comments)    Hyperkalemia  . Morphine And Related Shortness Of Breath    Rash, difficulty breathing, nausea.   . Atenolol Other (See Comments)    Other reaction(s): Other (See Comments) Decreased heart rate Decreased heart rate  . Codeine     Rash, difficulty breathing, nausea.  . Nsaids Other (See Comments)    Other reaction(s): Unknown  . Rofecoxib     Other reaction(s): Unknown  . Sucralfate Other (See Comments)    Throat tightness  . Sulfa Antibiotics     Other reaction(s): Unknown    Current Outpatient Medications  Medication Sig Dispense Refill  . albuterol (PROVENTIL HFA;VENTOLIN HFA) 108 (90 Base) MCG/ACT inhaler Inhale 2 puffs into the lungs every 6 (six) hours as needed for wheezing or shortness of breath.    Marland Kitchen amLODipine (NORVASC) 10 MG tablet Take 10 mg by mouth daily.    . cloNIDine (CATAPRES - DOSED IN MG/24 HR) 0.2 mg/24hr patch Place 1 patch (0.2 mg total) onto the skin once a week. 4 patch 12  . clopidogrel (PLAVIX) 75 MG tablet Take 1 tablet (75 mg total) by mouth daily. 90 tablet 3  . docusate sodium (COLACE) 100 MG capsule Take 100 mg by mouth daily as needed for mild constipation. Takes daily    . doxazosin (CARDURA) 8 MG tablet TAKE ONE TABLET TWICE DAILY 180 tablet 3  . furosemide (LASIX) 40 MG tablet Take 1 tablet (40 mg total) by mouth daily. 30 tablet 2  . hydrALAZINE (APRESOLINE) 25 MG tablet Take 1 tablet (25 mg) by mouth two  times a day. (Patient taking differently: Take 25 mg by mouth. For systolic BP 518) 841 tablet 2  . ipratropium-albuterol (DUONEB) 0.5-2.5 (3) MG/3ML SOLN Take 3 mLs by nebulization every 4 (four) hours as needed (shortness of breath/wheezing.). 360 mL   . levothyroxine (SYNTHROID, LEVOTHROID) 125 MCG tablet Take 125 mcg by mouth daily.    . metFORMIN (GLUCOPHAGE) 1000 MG tablet Take 1,000 mg by mouth 2 (two) times daily with a meal.    . Multiple Vitamin (MULTIVITAMIN) tablet Take 1 tablet by mouth daily.    Marland Kitchen oxybutynin (DITROPAN-XL) 5 MG 24 hr tablet Take 5 mg by mouth daily.     . simvastatin (ZOCOR) 40 MG tablet Take 1 tablet (40 mg total) by mouth at bedtime. (Patient taking differently: Take 40 mg by mouth at bedtime. Takes in the morning) 90 tablet 3  . SYMBICORT 160-4.5 MCG/ACT inhaler Inhale 2 puffs into the lungs 2 (two) times daily.     Marland Kitchen  pantoprazole (PROTONIX) 40 MG tablet Take 1 tablet (40 mg total) by mouth daily. 30 tablet 1   No current facility-administered medications for this visit.    Facility-Administered Medications Ordered in Other Visits  Medication Dose Route Frequency Provider Last Rate Last Dose  . 0.9 %  sodium chloride infusion   Intravenous Continuous Lloyd Huger, MD      . ferumoxytol [facility]) 510 mg in sodium chloride 0.9 % 100 mL IVPB  510 mg Intravenous Once Lloyd Huger, MD        OBJECTIVE: Vitals:   08/20/17 1059 08/20/17 1105  BP:  (!) 147/66  Pulse:  65  Resp: 20   Temp:  (!) 97 F (36.1 C)     Body mass index is 34.06 kg/m.    ECOG FS:1 - Symptomatic but completely ambulatory  General: Well-developed, well-nourished, no acute distress. Eyes: Pink conjunctiva, anicteric sclera. Lungs: Clear to auscultation bilaterally. Heart: Regular rate and rhythm. No rubs, murmurs, or gallops. Abdomen: Soft, nontender, nondistended. No organomegaly noted, normoactive bowel sounds. Musculoskeletal: No edema, cyanosis, or clubbing. Neuro:  Alert, answering all questions appropriately. Cranial nerves grossly intact. Skin: No rashes or petechiae noted. Psych: Normal affect.  LAB RESULTS:  Lab Results  Component Value Date   NA 140 05/20/2017   K 4.1 05/20/2017   CL 103 05/20/2017   CO2 26 05/20/2017   GLUCOSE 146 (H) 05/20/2017   BUN 18 05/20/2017   CREATININE 1.32 (H) 05/20/2017   CALCIUM 7.8 (L) 05/20/2017   PROT 5.9 (L) 05/15/2017   ALBUMIN 2.9 (L) 05/15/2017   AST 26 05/15/2017   ALT 11 (L) 05/15/2017   ALKPHOS 70 05/15/2017   BILITOT 0.5 05/15/2017   GFRNONAA 35 (L) 05/20/2017   GFRAA 40 (L) 05/20/2017    Lab Results  Component Value Date   WBC 6.0 08/20/2017   NEUTROABS 3.7 08/20/2017   HGB 11.9 (L) 08/20/2017   HCT 34.8 (L) 08/20/2017   MCV 92.5 08/20/2017   PLT 179 08/20/2017   Lab Results  Component Value Date   IRON 88 08/20/2017   TIBC 253 08/20/2017   IRONPCTSAT 35 (H) 08/20/2017    Lab Results  Component Value Date   FERRITIN 118 08/20/2017    STUDIES: No results found.  ASSESSMENT:  Iron deficiency anemia secondary to chronic blood loss, shortness of breath.   PLAN:    1. Iron deficiency anemia secondary to chronic blood loss: Previously, patient was reported to be intolerant of oral iron supplementation.  Patient had colonoscopy and EGD in August 2018.  Patient is symptomatically improved.  Her hemoglobin and iron stores are now within normal limits.  She last received IV Feraheme on April 30th 2019.  No interventions are needed at this time.  Return to clinic in 2 months with repeat laboratory work and further evaluation.   2. Pulmonary nodules: No intervention needed.  Repeat CT scan from January 13, 2017 revealed stable pulmonary nodules.  No further imaging is necessary.   3. Renal insufficiency: Mild, monitor. 4.  Hypertension: Patient's blood pressure continues to be mildly elevated today.  Continue monitoring and treatment per primary care.   5.  GI bleed: Colonoscopy and  EGD in August 2018.  Continue follow-up with GI as indicated.   Patient expressed understanding and was in agreement with this plan. She also understands that She can call clinic at any time with any questions, concerns, or complaints.    Lloyd Huger, MD   08/23/2017  10:22 AM

## 2017-08-20 ENCOUNTER — Inpatient Hospital Stay: Payer: Medicare Other | Attending: Oncology

## 2017-08-20 ENCOUNTER — Other Ambulatory Visit: Payer: Self-pay

## 2017-08-20 ENCOUNTER — Inpatient Hospital Stay: Payer: Medicare Other

## 2017-08-20 ENCOUNTER — Encounter: Payer: Self-pay | Admitting: Oncology

## 2017-08-20 ENCOUNTER — Inpatient Hospital Stay (HOSPITAL_BASED_OUTPATIENT_CLINIC_OR_DEPARTMENT_OTHER): Payer: Medicare Other | Admitting: Oncology

## 2017-08-20 VITALS — BP 147/66 | HR 65 | Temp 97.0°F | Resp 20 | Ht 62.0 in | Wt 186.2 lb

## 2017-08-20 DIAGNOSIS — R911 Solitary pulmonary nodule: Secondary | ICD-10-CM | POA: Diagnosis not present

## 2017-08-20 DIAGNOSIS — D500 Iron deficiency anemia secondary to blood loss (chronic): Secondary | ICD-10-CM

## 2017-08-20 DIAGNOSIS — R0602 Shortness of breath: Secondary | ICD-10-CM | POA: Diagnosis not present

## 2017-08-20 DIAGNOSIS — I10 Essential (primary) hypertension: Secondary | ICD-10-CM

## 2017-08-20 DIAGNOSIS — N289 Disorder of kidney and ureter, unspecified: Secondary | ICD-10-CM

## 2017-08-20 LAB — CBC WITH DIFFERENTIAL/PLATELET
BASOS ABS: 0 10*3/uL (ref 0–0.1)
BASOS PCT: 0 %
EOS PCT: 3 %
Eosinophils Absolute: 0.2 10*3/uL (ref 0–0.7)
HCT: 34.8 % — ABNORMAL LOW (ref 35.0–47.0)
Hemoglobin: 11.9 g/dL — ABNORMAL LOW (ref 12.0–16.0)
Lymphocytes Relative: 26 %
Lymphs Abs: 1.6 10*3/uL (ref 1.0–3.6)
MCH: 31.6 pg (ref 26.0–34.0)
MCHC: 34.1 g/dL (ref 32.0–36.0)
MCV: 92.5 fL (ref 80.0–100.0)
MONO ABS: 0.6 10*3/uL (ref 0.2–0.9)
Monocytes Relative: 10 %
Neutro Abs: 3.7 10*3/uL (ref 1.4–6.5)
Neutrophils Relative %: 61 %
PLATELETS: 179 10*3/uL (ref 150–440)
RBC: 3.76 MIL/uL — AB (ref 3.80–5.20)
RDW: 15.2 % — AB (ref 11.5–14.5)
WBC: 6 10*3/uL (ref 3.6–11.0)

## 2017-08-20 LAB — SAMPLE TO BLOOD BANK

## 2017-08-20 LAB — FERRITIN: FERRITIN: 118 ng/mL (ref 11–307)

## 2017-08-20 LAB — IRON AND TIBC
Iron: 88 ug/dL (ref 28–170)
Saturation Ratios: 35 % — ABNORMAL HIGH (ref 10.4–31.8)
TIBC: 253 ug/dL (ref 250–450)
UIBC: 165 ug/dL

## 2017-08-20 NOTE — Progress Notes (Signed)
Patient here for follow up. She has recently been taken off aspirin. She reports that her stools look normal.

## 2017-08-31 ENCOUNTER — Ambulatory Visit: Payer: Medicare Other | Admitting: Cardiovascular Disease

## 2017-08-31 DIAGNOSIS — H353211 Exudative age-related macular degeneration, right eye, with active choroidal neovascularization: Secondary | ICD-10-CM | POA: Diagnosis not present

## 2017-08-31 DIAGNOSIS — H353222 Exudative age-related macular degeneration, left eye, with inactive choroidal neovascularization: Secondary | ICD-10-CM | POA: Diagnosis not present

## 2017-08-31 NOTE — Progress Notes (Signed)
Cardiology Office Note  Date:  09/02/2017   ID:  BREAH JOA, DOB 02/19/1928, MRN [id number]  PCP:  Leonel Ramsay, MD   Chief Complaint  Patient presents with  . other    3 month follow up. Meds reviewed by the pt. verbally. Pt. c/o shortness of breath.     HPI:  Ms. Demattia is a pleasant 84 year old woman with a history of  coronary artery disease,  proximal LAD with stent  incontinence,  Beta blocker has been held in the past secondary to bradycardia.  lost her daughter, previous adjustment disorder Long history of poor diet COPD exacerbations morbid obesity Chronic baseline shortness of breath Echo 02/2015 EF 60%, mild AS, LVH Off losartan secondary to hyperkalemia, was on HCTZ Chronic SOB who now presents for routine follow up of her coronary artery disease  No recent hospitalizations SOB with exertion, improving Doing [employer] 2x a week  Daughter retired, helping  Overall feeling much better but still weak Slowly improving Presenting today in a wheelchair Taking Lasix 20 daily  Anemia resolved HB 11.9 HCT 34.8 Total chol 156/ LDL 74  EKG personally reviewed by myself on todays visit shows normal sinus rhythm with rate 74 bpm right bundle branch block  Other past medical history reviewed  hospitalization March  2019 Sepsis due to pneumonia, upper GI bleed melena, acute on chronic diastolic CHF  progressive shortness of breath with melena on arrival, pneumonia, treated with antibiotics pleural effusion felt secondary to CHF Asa plavix held prbc x1 HCT 25.7 D/c on plavix with no asa  Stress test 03/2017 Pharmacological myocardial perfusion imaging study with no significant  Ischemia Small region of fixed apical thinning, consistent with attenuation artifact GI uptake artifact noted Normal wall motion, EF estimated at 56% No EKG changes concerning for ischemia at peak stress or in recovery. Low risk scan  hospitalization mid August  2017 COPD exacerbation, required steroids, DuoNeb's, flutter device, antibiotics CT scan showing mucus plugging, postobstructive atelectasis rehab, [facility] Now at home  Son in [place] helps her with her house chores  [facility] August 2015. She had diarrhea, vomiting, felt to be viral also with acute renal failure that improved with hydration. Creatinine reached 1.86, one month later creatinine 1.0 as an outpatient Continues to be weak, does not walk much and is deconditioned  Guaiac positive and she had colonoscopy and EGD. She reports Dr. Vira Agar did not find anything Iron transfusions in the past under the direction of Dr. Ma Hillock.   Prior episode of shortness of breath while walking in the hospital to schedule a colonoscopy appointment. sent to the emergency room for further evaluation. Systolic pressure was in the 170 range. workup at that time was essentially normal and she was discharged home.  cardiac catheter performed at [facility] on June 22, 2009 details 90% proximal LAD, 40% mid LAD, 40% diagonal, 60% proximal circumflex followed by 40% circumflex lesion, 30%, 40% and 30% lesion noted in the RCA with distal RCA with 30% and 25% lesions. Mild atheroma in the aorta. Xience 2.75 x 12 mm Drug eluting stent placed.   stress test 01/14/2012 showing no ischemia, Lexiscan  PMH:   has a past medical history of Bladder incontinence, CAD (coronary artery disease), Cancer ([place]), Carotid arterial disease w/ R Carotid Bruit (HCC), Chronic diastolic (congestive) heart failure (HCC), Chronic Dyspnea on exertion, CKD (chronic kidney disease) stage 3, GFR 30-59 ml/min (HCC), Degenerative arthritis of knee, Diabetes mellitus, GIB (gastrointestinal bleeding), Hiatal hernia, Hypertension, Iron deficiency,  Menopausal symptoms, Morbid obesity ([place]), Renal insufficiency, and Thyroid disease.  PSH:    Past Surgical History:  Procedure Laterality Date  . COLONOSCOPY  2015  . COLONOSCOPY WITH  PROPOFOL N/A 09/25/2016   Procedure: COLONOSCOPY WITH PROPOFOL;  Surgeon: [name], MD;  Location: [facility] ENDOSCOPY;  Service: Gastroenterology;  Laterality: N/A;  . COLONOSCOPY WITH PROPOFOL N/A 09/26/2016   Procedure: COLONOSCOPY WITH PROPOFOL;  Surgeon: [name], MD;  Location: [facility] ENDOSCOPY;  Service: Gastroenterology;  Laterality: N/A;  . ESOPHAGOGASTRODUODENOSCOPY (EGD) WITH PROPOFOL N/A 09/25/2016   Procedure: ESOPHAGOGASTRODUODENOSCOPY (EGD) WITH PROPOFOL;  Surgeon: [name], MD;  Location: [facility] ENDOSCOPY;  Service: Gastroenterology;  Laterality: N/A;  . REPLACEMENT TOTAL KNEE BILATERAL    . TOTAL VAGINAL HYSTERECTOMY     ovarian mass, not cancerous  . UPPER GI ENDOSCOPY  2015    Current Outpatient Medications  Medication Sig Dispense Refill  . albuterol (PROVENTIL HFA;VENTOLIN HFA) 108 (90 Base) MCG/ACT inhaler Inhale 2 puffs into the lungs every 6 (six) hours as needed for wheezing or shortness of breath.    Marland Kitchen amLODipine (NORVASC) 10 MG tablet Take 10 mg by mouth daily.    . cloNIDine (CATAPRES - DOSED IN MG/24 HR) 0.2 mg/24hr patch Place 1 patch (0.2 mg total) onto the skin once a week. 4 patch 12  . clopidogrel (PLAVIX) 75 MG tablet Take 1 tablet (75 mg total) by mouth daily. 90 tablet 3  . docusate sodium (COLACE) 100 MG capsule Take 100 mg by mouth daily as needed for mild constipation. Takes daily    . doxazosin (CARDURA) 8 MG tablet TAKE ONE TABLET TWICE DAILY 180 tablet 3  . furosemide (LASIX) 40 MG tablet Take 1 tablet (40 mg total) by mouth daily. 30 tablet 2  . hydrALAZINE (APRESOLINE) 25 MG tablet Take 1 tablet (25 mg) by mouth two times a day. (Patient taking differently: Take 25 mg by mouth. For systolic BP 101) 751 tablet 2  . ipratropium-albuterol (DUONEB) 0.5-2.5 (3) MG/3ML SOLN Take 3 mLs by nebulization every 4 (four) hours as needed (shortness of breath/wheezing.). 360 mL   . levothyroxine (SYNTHROID, LEVOTHROID) 125 MCG tablet Take 125 mcg by mouth daily.    .  metFORMIN (GLUCOPHAGE) 1000 MG tablet Take 1,000 mg by mouth 2 (two) times daily with a meal.    . Multiple Vitamin (MULTIVITAMIN) tablet Take 1 tablet by mouth daily.    Marland Kitchen oxybutynin (DITROPAN-XL) 5 MG 24 hr tablet Take 5 mg by mouth daily.     . simvastatin (ZOCOR) 40 MG tablet Take 1 tablet (40 mg total) by mouth at bedtime. (Patient taking differently: Take 40 mg by mouth at bedtime. Takes in the morning) 90 tablet 3  . SYMBICORT 160-4.5 MCG/ACT inhaler Inhale 2 puffs into the lungs 2 (two) times daily.     . pantoprazole (PROTONIX) 40 MG tablet Take 1 tablet (40 mg total) by mouth daily. 30 tablet 1   No current facility-administered medications for this visit.    Facility-Administered Medications Ordered in Other Visits  Medication Dose Route Frequency Provider Last Rate Last Dose  . 0.9 %  sodium chloride infusion   Intravenous Continuous Lloyd Huger, MD      . ferumoxytol [facility]) 510 mg in sodium chloride 0.9 % 100 mL IVPB  510 mg Intravenous Once Lloyd Huger, MD         Allergies:   Losartan; Morphine and related; Atenolol; Codeine; Nsaids; Rofecoxib; Sucralfate; and Sulfa antibiotics   Social History:  The patient  reports that she has never smoked. She has never used smokeless tobacco. She reports that she does not drink alcohol or use drugs.   Family History:   family history includes Breast cancer (age of onset: 88) in her mother.    Review of Systems: Review of Systems  Constitutional: Positive for malaise/fatigue.  Respiratory: Negative.   Cardiovascular: Negative.   Gastrointestinal: Negative.   Musculoskeletal: Negative.        Leg weakness  Neurological: Negative.   Psychiatric/Behavioral: Negative.   All other systems reviewed and are negative.    PHYSICAL EXAM: VS:  BP (!) 140/40 (BP Location: Left Arm, Patient Position: Sitting, Cuff Size: Normal)   Pulse 74   Ht 5\' 2"  (1.575 m)   Wt 184 lb (83.5 kg)   BMI 33.65 kg/m  , BMI Body mass  index is 33.65 kg/m.  Constitutional:  oriented to person, place, and time. No distress.  Presenting in a wheelchair HENT:  Head: Normocephalic and atraumatic.  Eyes:  no discharge. No scleral icterus.  Neck: Normal range of motion. Neck supple. No JVD present.  Cardiovascular: Normal rate, regular rhythm, normal heart sounds and intact distal pulses. Exam reveals no gallop and no friction rub. No edema No murmur heard. Pulmonary/Chest: Effort normal and breath sounds normal. No stridor. No respiratory distress.  no wheezes.  no rales.  no tenderness.  Abdominal: Soft.  no distension.  no tenderness.  Musculoskeletal: Normal range of motion.  no  tenderness or deformity.  Neurological:  normal muscle tone. Coordination normal. No atrophy Skin: Skin is warm and dry. No rash noted. not diaphoretic.  Psychiatric:  normal mood and affect. behavior is normal. Thought content normal.    Recent Labs: 05/15/2017: ALT 11 05/20/2017: BUN 18; Creatinine, Ser 1.32; Potassium 4.1; Sodium 140 08/20/2017: Hemoglobin 11.9; Platelets 179    Lipid Panel Lab Results  Component Value Date   CHOL 135 06/25/2010   HDL 35 (L) 06/25/2010   LDLCALC 56 06/25/2010   TRIG 220 (H) 06/25/2010      Wt Readings from Last 3 Encounters:  09/02/17 184 lb (83.5 kg)  08/20/17 186 lb 3.2 oz (84.5 kg)  07/27/17 188 lb (85.3 kg)       ASSESSMENT AND PLAN:  SOB (shortness of breath) - Plan: EKG 12-Lead Baseline deconditioning Long discussion concerning her need continued physical therapy walking swimming For baseline conditioning Appears euvolemic  Coronary artery disease of native artery of native heart with stable angina pectoris ([place])  pharmacologic Myoview in February with no ischemia On Plavix,  aspirin held given recent GI bleed No recent bleeding Denies chest pain concerning for angina  Essential hypertension - Plan: EKG 12-Lead No medication changes made, very low diastolic pressure,  asymptomatic Table, denies any orthostasis symptoms  Chronic diastolic CHF (congestive heart failure) (HCC) Continue Lasix 20 daily Appears relatively euvolemic She has cut down on her fluid and salt  Paroxysmal atrial fibrillation ([place]) - Plan: EKG 12-Lead Maintaining normal sinus rhythm Not on anticoagulation stable I fall risk  COPD exacerbation ([place]) - Plan: EKG 12-Lead March 2019 pneumonia, sepsis,  Slow to recover  Acute renal failure superimposed on stage 3 chronic kidney disease, unspecified acute renal failure type (HCC) creatinine 1.3 Continue Lasix 20 daily  Anemia, unspecified type - Much improved on iron infusion Off aspirin   Total encounter time more than 25 minutes  Greater than 50% was spent in counseling and coordination of care with the patient  Disposition:  F/U  12 months   Orders Placed This Encounter  Procedures  . EKG 12-Lead     Signed, Esmond Plants, M.D., Ph.D. 09/02/2017  [place], [place]

## 2017-09-02 ENCOUNTER — Encounter: Payer: Self-pay | Admitting: Cardiovascular Disease

## 2017-09-02 ENCOUNTER — Ambulatory Visit (INDEPENDENT_AMBULATORY_CARE_PROVIDER_SITE_OTHER): Payer: Medicare Other | Admitting: Cardiovascular Disease

## 2017-09-02 VITALS — BP 140/40 | HR 74 | Ht 62.0 in | Wt 184.0 lb

## 2017-09-02 DIAGNOSIS — I25118 Atherosclerotic heart disease of native coronary artery with other forms of angina pectoris: Secondary | ICD-10-CM | POA: Diagnosis not present

## 2017-09-02 DIAGNOSIS — E782 Mixed hyperlipidemia: Secondary | ICD-10-CM

## 2017-09-02 DIAGNOSIS — I2510 Atherosclerotic heart disease of native coronary artery without angina pectoris: Secondary | ICD-10-CM | POA: Diagnosis not present

## 2017-09-02 DIAGNOSIS — N189 Chronic kidney disease, unspecified: Secondary | ICD-10-CM

## 2017-09-02 DIAGNOSIS — I10 Essential (primary) hypertension: Secondary | ICD-10-CM

## 2017-09-02 DIAGNOSIS — I480 Paroxysmal atrial fibrillation: Secondary | ICD-10-CM | POA: Diagnosis not present

## 2017-09-02 DIAGNOSIS — I5032 Chronic diastolic (congestive) heart failure: Secondary | ICD-10-CM | POA: Diagnosis not present

## 2017-09-02 DIAGNOSIS — N183 Chronic kidney disease, stage 3 (moderate): Secondary | ICD-10-CM | POA: Diagnosis not present

## 2017-09-02 DIAGNOSIS — E1122 Type 2 diabetes mellitus with diabetic chronic kidney disease: Secondary | ICD-10-CM

## 2017-09-02 DIAGNOSIS — R0609 Other forms of dyspnea: Secondary | ICD-10-CM | POA: Diagnosis not present

## 2017-09-02 NOTE — Patient Instructions (Signed)

## 2017-09-11 ENCOUNTER — Other Ambulatory Visit: Payer: Self-pay | Admitting: Cardiovascular Disease

## 2017-09-21 DIAGNOSIS — E1165 Type 2 diabetes mellitus with hyperglycemia: Secondary | ICD-10-CM | POA: Diagnosis not present

## 2017-09-21 DIAGNOSIS — Z0000 Encounter for general adult medical examination without abnormal findings: Secondary | ICD-10-CM | POA: Diagnosis not present

## 2017-09-21 DIAGNOSIS — E7801 Familial hypercholesterolemia: Secondary | ICD-10-CM | POA: Diagnosis not present

## 2017-09-21 DIAGNOSIS — I4891 Unspecified atrial fibrillation: Secondary | ICD-10-CM | POA: Diagnosis not present

## 2017-09-21 DIAGNOSIS — I2510 Atherosclerotic heart disease of native coronary artery without angina pectoris: Secondary | ICD-10-CM | POA: Diagnosis not present

## 2017-09-21 DIAGNOSIS — I10 Essential (primary) hypertension: Secondary | ICD-10-CM | POA: Diagnosis not present

## 2017-09-21 DIAGNOSIS — E079 Disorder of thyroid, unspecified: Secondary | ICD-10-CM | POA: Diagnosis not present

## 2017-09-21 DIAGNOSIS — J441 Chronic obstructive pulmonary disease with (acute) exacerbation: Secondary | ICD-10-CM | POA: Diagnosis not present

## 2017-09-21 DIAGNOSIS — N183 Chronic kidney disease, stage 3 (moderate): Secondary | ICD-10-CM | POA: Diagnosis not present

## 2017-09-21 DIAGNOSIS — E538 Deficiency of other specified B group vitamins: Secondary | ICD-10-CM | POA: Diagnosis not present

## 2017-09-28 ENCOUNTER — Ambulatory Visit: Payer: Medicare Other | Admitting: Family

## 2017-09-28 DIAGNOSIS — H353222 Exudative age-related macular degeneration, left eye, with inactive choroidal neovascularization: Secondary | ICD-10-CM | POA: Diagnosis not present

## 2017-09-29 ENCOUNTER — Telehealth: Payer: Self-pay | Admitting: Cardiovascular Disease

## 2017-09-29 NOTE — Telephone Encounter (Signed)
Patient calling stating she's been told to take Hydralazine only when her BP is over 160  She states she's not sure when to not take it, she's been taking it every day  Was advised by [employer]  to call and make sure she is okay to do this  For it is always over that so she's been taking it twice a day   She is calling to make sure she is correctly taking this  Please advise

## 2017-09-29 NOTE — Telephone Encounter (Signed)
S/w patient.  She was concerned she was not taking her Hydralazine correctly. From last office visit on 09/02/17, it is noted patient taking twice a day. The change was made at blood pressure check on 03/24/17 and patient says she's been taking it ever since. She does not check her BP at home because she has a wrist cuff and she does not think it is accurate.  At [employer] program her BP is usually around 146/0 when she starts then increases to 150's/50's.  I see in her record where her diastolics are routinely low. Advised patient to continue current medications at this time. Routing to Dr Rockey Situ to make him aware.

## 2017-10-02 NOTE — Telephone Encounter (Signed)
Would get some pressures from home, not just when at forever fit Might run lower at home?

## 2017-10-02 NOTE — Telephone Encounter (Signed)
Patient called and made aware of the recommendations. She will check her blood pressure over the weekend and log them. She will call back on Monday with those readings.

## 2017-10-05 ENCOUNTER — Ambulatory Visit: Payer: Medicare Other | Attending: Family | Admitting: Family

## 2017-10-05 ENCOUNTER — Encounter: Payer: Self-pay | Admitting: Family

## 2017-10-05 VITALS — BP 158/41 | HR 66 | Resp 18 | Ht 62.0 in | Wt 182.5 lb

## 2017-10-05 DIAGNOSIS — Z882 Allergy status to sulfonamides status: Secondary | ICD-10-CM | POA: Insufficient documentation

## 2017-10-05 DIAGNOSIS — I130 Hypertensive heart and chronic kidney disease with heart failure and stage 1 through stage 4 chronic kidney disease, or unspecified chronic kidney disease: Secondary | ICD-10-CM | POA: Diagnosis not present

## 2017-10-05 DIAGNOSIS — E1122 Type 2 diabetes mellitus with diabetic chronic kidney disease: Secondary | ICD-10-CM

## 2017-10-05 DIAGNOSIS — Z79899 Other long term (current) drug therapy: Secondary | ICD-10-CM | POA: Diagnosis not present

## 2017-10-05 DIAGNOSIS — I5032 Chronic diastolic (congestive) heart failure: Secondary | ICD-10-CM | POA: Insufficient documentation

## 2017-10-05 DIAGNOSIS — I2510 Atherosclerotic heart disease of native coronary artery without angina pectoris: Secondary | ICD-10-CM | POA: Diagnosis not present

## 2017-10-05 DIAGNOSIS — Z90710 Acquired absence of both cervix and uterus: Secondary | ICD-10-CM | POA: Diagnosis not present

## 2017-10-05 DIAGNOSIS — Z7984 Long term (current) use of oral hypoglycemic drugs: Secondary | ICD-10-CM | POA: Diagnosis not present

## 2017-10-05 DIAGNOSIS — I10 Essential (primary) hypertension: Secondary | ICD-10-CM

## 2017-10-05 DIAGNOSIS — N183 Chronic kidney disease, stage 3 unspecified: Secondary | ICD-10-CM

## 2017-10-05 DIAGNOSIS — Z886 Allergy status to analgesic agent status: Secondary | ICD-10-CM | POA: Diagnosis not present

## 2017-10-05 DIAGNOSIS — Z9889 Other specified postprocedural states: Secondary | ICD-10-CM | POA: Insufficient documentation

## 2017-10-05 DIAGNOSIS — Z96653 Presence of artificial knee joint, bilateral: Secondary | ICD-10-CM | POA: Insufficient documentation

## 2017-10-05 DIAGNOSIS — Z955 Presence of coronary angioplasty implant and graft: Secondary | ICD-10-CM | POA: Diagnosis not present

## 2017-10-05 DIAGNOSIS — Z885 Allergy status to narcotic agent status: Secondary | ICD-10-CM | POA: Insufficient documentation

## 2017-10-05 DIAGNOSIS — D649 Anemia, unspecified: Secondary | ICD-10-CM | POA: Insufficient documentation

## 2017-10-05 LAB — GLUCOSE, CAPILLARY: Glucose-Capillary: 188 mg/dL — ABNORMAL HIGH (ref 70–99)

## 2017-10-05 NOTE — Telephone Encounter (Signed)
To Dr. Rockey Situ to review BP readings.

## 2017-10-05 NOTE — Patient Instructions (Signed)
Continue weighing daily and call for an overnight weight gain of > 2 pounds or a weekly weight gain of >5 pounds. 

## 2017-10-05 NOTE — Telephone Encounter (Signed)
Pt is calling back with her BP readings: 8/10- 166/54, 151/52, 181/57 8/11- 166/56, 131/59, 160/52 8/12- 155/50

## 2017-10-05 NOTE — Progress Notes (Signed)
Patient ID: Amy [name], female    DOB: Feb 12, 1928, 84 y.o.   MRN: [id number]  HPI  Amy [name] is a 84 y/o female with a history of ovarian cancer, carotid disease, CAD, DM, HTN, CKD, thyroid disease, anemia, GIB and chronic heart failure.   Echo report from 10/22/16 reviewed and showed an EF of 65-70%.   Admitted 05/15/17 due to pneumonia and HF. Melena noted and GI was consults. 1 unit of PCBC's given. Given IV antibiotics and she was discharged after 5 days.   She presents today for a follow-up visit with a chief complaint of moderate fatigue upon minimal exertion. She describes this as chronic in nature having been present for several years. She has associated shortness of breath, light-headedness, easy bruising and difficulty sleeping along with this. She denies any abdominal distention, palpitations, pedal edema, chest pain or weight gain. Continues to go to [employer] classes 2 days / week.   Past Medical History:  Diagnosis Date  . Bladder incontinence   . CAD (coronary artery disease)    a. 05/2009 Cath: LAD 90p (Xience 2.75 x 12 mm DES), 57m, D1 40, LCx 40p/m, RCA 30/40/30p/m, RCA 30/25d;  b.  12/2011 Lexiscan MV: no ischemia, breast attenuation artifact, normal EF-->Low risk; c. 10/2016 MV: fixed apical defect, most likely apical thinning and attenuation, No ischemia, EF 60%.  . Cancer ([place])    ovarian  . Carotid arterial disease w/ R Carotid Bruit (HCC)    a. 08/2016 Carotid U/S: 40-59% bilat ICA stenosis - f/u 1 yr.  . Chronic diastolic (congestive) heart failure ([place])    a. Echo 09/2015: EF 55-60% w/ Grade 1 DD, sev Ca2+ MV annulus, mildly dil LA; b. 09/2016 Echo: EF 65-70%, Gr2 DD, mildly dil LA/RA, nl RV fxn.  . Chronic Dyspnea on exertion   . CKD (chronic kidney disease) stage 3, GFR 30-59 ml/min (HCC)   . Degenerative arthritis of knee    bilateral knees  . Diabetes mellitus    Type II  . GIB (gastrointestinal bleeding)    a. 08/2016 Admission w/  presyncope/anemia/melena-->req Transfusion-->endo ok, colonoscopy w/ polyps but no source of bleeding (most likely diverticular).  . Hiatal hernia   . Hypertension   . Iron deficiency   . Menopausal symptoms   . Morbid obesity ([place])   . Renal insufficiency   . Thyroid disease    hypothyroidism   Past Surgical History:  Procedure Laterality Date  . COLONOSCOPY  2015  . COLONOSCOPY WITH PROPOFOL N/A 09/25/2016   Procedure: COLONOSCOPY WITH PROPOFOL;  Surgeon: [name], MD;  Location: [facility] ENDOSCOPY;  Service: Gastroenterology;  Laterality: N/A;  . COLONOSCOPY WITH PROPOFOL N/A 09/26/2016   Procedure: COLONOSCOPY WITH PROPOFOL;  Surgeon: [name], MD;  Location: [facility] ENDOSCOPY;  Service: Gastroenterology;  Laterality: N/A;  . ESOPHAGOGASTRODUODENOSCOPY (EGD) WITH PROPOFOL N/A 09/25/2016   Procedure: ESOPHAGOGASTRODUODENOSCOPY (EGD) WITH PROPOFOL;  Surgeon: [name], MD;  Location: [facility] ENDOSCOPY;  Service: Gastroenterology;  Laterality: N/A;  . REPLACEMENT TOTAL KNEE BILATERAL    . TOTAL VAGINAL HYSTERECTOMY     ovarian mass, not cancerous  . UPPER GI ENDOSCOPY  2015   Family History  Problem Relation Age of Onset  . Breast cancer Mother 65   Social History   Tobacco Use  . Smoking status: Never Smoker  . Smokeless tobacco: Never Used  Substance Use Topics  . Alcohol use: No   Allergies  Allergen Reactions  . Losartan Other (See Comments)  Hyperkalemia  . Morphine And Related Shortness Of Breath    Rash, difficulty breathing, nausea.   . Atenolol Other (See Comments)    Other reaction(s): Other (See Comments) Decreased heart rate Decreased heart rate  . Codeine     Rash, difficulty breathing, nausea.  . Nsaids Other (See Comments)    Other reaction(s): Unknown  . Rofecoxib     Other reaction(s): Unknown  . Sucralfate Other (See Comments)    Throat tightness  . Sulfa Antibiotics     Other reaction(s): Unknown   Prior to Admission medications   Medication  Sig Start Date End Date Taking? Authorizing Provider  albuterol (PROVENTIL HFA;VENTOLIN HFA) 108 (90 Base) MCG/ACT inhaler Inhale 2 puffs into the lungs every 6 (six) hours as needed for wheezing or shortness of breath.   Yes [provider]  amLODipine (NORVASC) 10 MG tablet Take 10 mg by mouth daily.   Yes [provider]  cloNIDine (CATAPRES - DOSED IN MG/24 HR) 0.2 mg/24hr patch Place 1 patch (0.2 mg total) onto the skin once a week. 06/29/17  Yes Darylene Price A, FNP  clopidogrel (PLAVIX) 75 MG tablet Take 1 tablet (75 mg total) by mouth daily. 09/27/16  Yes Dustin Flock, MD  docusate sodium (COLACE) 100 MG capsule Take 100 mg by mouth daily as needed for mild constipation. Takes daily   Yes [provider]  doxazosin (CARDURA) 8 MG tablet TAKE ONE TABLET TWICE DAILY 03/24/17  Yes Gollan, Kathlene November, MD  furosemide (LASIX) 40 MG tablet Take 1 tablet (40 mg total) by mouth daily. 05/21/17  Yes Dustin Flock, MD  hydrALAZINE (APRESOLINE) 25 MG tablet Take 1 tablet (25 mg) by mouth two times a day. Patient taking differently: Take 25 mg by mouth. For systolic BP 245 10/02/96  Yes Gollan, Kathlene November, MD  ipratropium-albuterol (DUONEB) 0.5-2.5 (3) MG/3ML SOLN Take 3 mLs by nebulization every 4 (four) hours as needed (shortness of breath/wheezing.). 10/11/15  Yes Sainani, Belia Heman, MD  levothyroxine (SYNTHROID, LEVOTHROID) 125 MCG tablet Take 125 mcg by mouth daily.   Yes [provider]  metFORMIN (GLUCOPHAGE) 1000 MG tablet Take 1,000 mg by mouth 2 (two) times daily with a meal.   Yes [provider]  Multiple Vitamin (MULTIVITAMIN) tablet Take 1 tablet by mouth daily.   Yes [provider]  oxybutynin (DITROPAN-XL) 5 MG 24 hr tablet Take 5 mg by mouth daily.    Yes [provider]  pantoprazole (PROTONIX) 40 MG tablet Take 1 tablet (40 mg total) by mouth daily. 05/20/17 10/05/17 Yes Dustin Flock, MD  simvastatin (ZOCOR) 40 MG tablet Take 1  tablet (40 mg total) by mouth at bedtime. 09/11/17  Yes Minna Merritts, MD  SYMBICORT 160-4.5 MCG/ACT inhaler Inhale 2 puffs into the lungs 2 (two) times daily.  11/15/14  Yes [provider]    Review of Systems  Constitutional: Positive for fatigue (easily). Negative for appetite change.  HENT: Negative for congestion, postnasal drip and sore throat.   Eyes: Negative.   Respiratory: Positive for shortness of breath (easily). Negative for chest tightness.   Cardiovascular: Negative for chest pain, palpitations and leg swelling.  Gastrointestinal: Negative for abdominal distention and abdominal pain.  Endocrine: Negative.   Genitourinary: Negative.   Musculoskeletal: Negative for back pain.  Skin: Negative.   Allergic/Immunologic: Negative.   Neurological: Positive for light-headedness (on occasion). Negative for dizziness.  Hematological: Negative for adenopathy. Bruises/bleeds easily.  Psychiatric/Behavioral: Positive for dysphoric mood (sometimes with  death of daughter a few years ago) and sleep disturbance (wake up every few hours). The patient is not nervous/anxious.    Vitals:   10/05/17 0838  BP: (!) 158/41  Pulse: 66  Resp: 18  SpO2: 99%  Weight: 182 lb 8 oz (82.8 kg)  Height: 5\' 2"  (1.575 m)   Wt Readings from Last 3 Encounters:  10/05/17 182 lb 8 oz (82.8 kg)  09/02/17 184 lb (83.5 kg)  08/20/17 186 lb 3.2 oz (84.5 kg)    Lab Results  Component Value Date   CREATININE 1.32 (H) 05/20/2017   CREATININE 1.36 (H) 05/19/2017   CREATININE 1.30 (H) 05/18/2017    Physical Exam  Constitutional: She is oriented to person, place, and time. She appears well-developed and well-nourished.  HENT:  Head: Normocephalic and atraumatic.  Neck: Normal range of motion. Neck supple. No JVD present.  Cardiovascular: Normal rate and regular rhythm.  Pulmonary/Chest: Effort normal. No respiratory distress. She has no wheezes. She has no rales.  Abdominal: Soft. She  exhibits no distension.  Musculoskeletal:       Right lower leg: She exhibits no edema.       Left lower leg: She exhibits no edema.  Neurological: She is alert and oriented to person, place, and time.  Skin: Skin is warm and dry.  Psychiatric: She has a normal mood and affect. Her behavior is normal.  Nursing note and vitals reviewed.  Assessment & Plan:   1; Chronic heart failure with preserved ejection fraction- - NYHA class III - euvolemic today - weighing daily and says that her weight has gradually declined. Reminded to call for an overnight weight gain of >2 pounds or a weekly weight gain of >5 pounds - weight down 10 pounds since she was last here 3 months ago - not adding salt to her food but admits that she doesn't read food labels much. Reviewed the importance of closely following a 2000mg  sodium diet  - has great difficulty in walking even short distances - going to [employer] classes twice weekly; encouraged her to increase her activity at home as her shortness of breath may be related to deconditioning - saw cardiology Rockey Situ) 09/02/17 - saw pulmonology Ashby Dawes) 07/27/17 - BNP 10/01/15 was 53.0  2: HTN- - BP mildly elevated today - saw PCP Edwina Barth) 09/21/17 - BMP 09/21/17 reviewed and showed sodium 146, potassium 4.4 and GFR 42  3: DM- - nonfasting glucose in clinic today was 188; ate 1/2 frozen waffle with tsp of syrup and a peach - A1c 09/21/17 was 6.3%  4: Anemia- - saw hematologist Grayland Ormond ) 08/20/17 - receiving iron infusions ~ every 2 months  Patient did not bring her medications nor a list. Each medication was verbally reviewed with the patient and she was encouraged to bring the bottles to every visit to confirm accuracy of list.  Will not make a return appointment at this time. Advised patient that she could call back at anytime to make another appointment.

## 2017-10-06 MED ORDER — LISINOPRIL 10 MG PO TABS: 10.0000 mg | ORAL_TABLET | Freq: Every day | ORAL | 3 refills | Status: DC

## 2017-10-06 NOTE — Telephone Encounter (Signed)
Would add lisinopril 10 mg daily Do not see where she has tried this before and it is not on allergy list This may help her mild renal dysfunction Dose can be increased if needed if pressure continues to run high

## 2017-10-06 NOTE — Addendum Note (Signed)
Addended by: Vanessa Ralphs on: 10/06/2017 10:21 AM   Modules accepted: Orders

## 2017-10-06 NOTE — Telephone Encounter (Signed)
S/w patient. She verbalized understanding to start lisinopril 10 mg daily. Rx sent to pharmacy. I encouraged patient to monitor her BP at home. She is aware to try to do this but does not like her BP cuff from home. I encouraged her to still use it. She verbalized understanding.

## 2017-10-16 ENCOUNTER — Other Ambulatory Visit: Payer: Self-pay | Admitting: *Deleted

## 2017-10-16 DIAGNOSIS — D509 Iron deficiency anemia, unspecified: Secondary | ICD-10-CM

## 2017-10-16 NOTE — Progress Notes (Signed)
cbc

## 2017-10-18 NOTE — Progress Notes (Signed)
[facility]  [phone] [phone]) [phone]  ID: Amy [name] OB: 02/21/1928  MR#: [id number]  UMP#:[id number]  Patient Care Team: [name], MD as PCP - General (Internal Medicine) Rockey Situ Kathlene November, MD as PCP - Cardiology (Cardiology) Minna Merritts, MD as Consulting Physician (Cardiology)  CHIEF COMPLAINT: Iron deficiency anemia secondary to chronic blood loss.  INTERVAL HISTORY: Patient returns to clinic today for repeat laboratory can further evaluation.  She continues to have chronic weakness and fatigue.  Patient complains of increasing hoarseness of voice over the last 1 to 2 weeks.  She otherwise feels well.  She has not noted any further GI bleeds or changes in her bowel movements. She has no neurologic complaints. She denies any recent fevers or illnesses. She denies any chest pain, cough, or hemoptysis.  She reports continued chronic shortness of breath.  She denies any nausea, vomiting, constipation, or diarrhea.  She denies any further melena or hematochezia.  She has no urinary complaints.  Patient offers no further specific complaints today.  REVIEW OF SYSTEMS:   Review of Systems  Constitutional: Positive for malaise/fatigue. Negative for fever and weight loss.  HENT: Negative.  Negative for sore throat.   Respiratory: Positive for shortness of breath. Negative for cough, hemoptysis and stridor.   Cardiovascular: Negative.  Negative for chest pain and leg swelling.  Gastrointestinal: Negative.  Negative for abdominal pain, blood in stool and melena.  Genitourinary: Negative.  Negative for hematuria.  Musculoskeletal: Negative.  Negative for back pain.  Skin: Negative.  Negative for rash.  Neurological: Positive for weakness. Negative for sensory change, focal weakness and headaches.  Psychiatric/Behavioral: Negative.  The patient is not nervous/anxious.     As per HPI. Otherwise, a complete review of systems is  negative.  PAST MEDICAL HISTORY: Past Medical History:  Diagnosis Date  . Bladder incontinence   . CAD (coronary artery disease)    a. 05/2009 Cath: LAD 90p (Xience 2.75 x 12 mm DES), 57m, D1 40, LCx 40p/m, RCA 30/40/30p/m, RCA 30/25d;  b.  12/2011 Lexiscan MV: no ischemia, breast attenuation artifact, normal EF-->Low risk; c. 10/2016 MV: fixed apical defect, most likely apical thinning and attenuation, No ischemia, EF 60%.  . Cancer ([place])    ovarian  . Carotid arterial disease w/ R Carotid Bruit (HCC)    a. 08/2016 Carotid U/S: 40-59% bilat ICA stenosis - f/u 1 yr.  . Chronic diastolic (congestive) heart failure ([place])    a. Echo 09/2015: EF 55-60% w/ Grade 1 DD, sev Ca2+ MV annulus, mildly dil LA; b. 09/2016 Echo: EF 65-70%, Gr2 DD, mildly dil LA/RA, nl RV fxn.  . Chronic Dyspnea on exertion   . CKD (chronic kidney disease) stage 3, GFR 30-59 ml/min (HCC)   . Degenerative arthritis of knee    bilateral knees  . Diabetes mellitus    Type II  . GIB (gastrointestinal bleeding)    a. 08/2016 Admission w/ presyncope/anemia/melena-->req Transfusion-->endo ok, colonoscopy w/ polyps but no source of bleeding (most likely diverticular).  . Hiatal hernia   . Hypertension   . Iron deficiency   . Menopausal symptoms   . Morbid obesity ([place])   . Renal insufficiency   . Thyroid disease    hypothyroidism    PAST SURGICAL HISTORY: Past Surgical History:  Procedure Laterality Date  . COLONOSCOPY  2015  . COLONOSCOPY WITH PROPOFOL N/A 09/25/2016   Procedure: COLONOSCOPY WITH PROPOFOL;  Surgeon: [name], MD;  Location: [facility] ENDOSCOPY;  Service:  Gastroenterology;  Laterality: N/A;  . COLONOSCOPY WITH PROPOFOL N/A 09/26/2016   Procedure: COLONOSCOPY WITH PROPOFOL;  Surgeon: [name], MD;  Location: [facility] ENDOSCOPY;  Service: Gastroenterology;  Laterality: N/A;  . ESOPHAGOGASTRODUODENOSCOPY (EGD) WITH PROPOFOL N/A 09/25/2016   Procedure: ESOPHAGOGASTRODUODENOSCOPY (EGD) WITH PROPOFOL;  Surgeon: [name], MD;  Location: [facility] ENDOSCOPY;  Service: Gastroenterology;  Laterality: N/A;  . REPLACEMENT TOTAL KNEE BILATERAL    . TOTAL VAGINAL HYSTERECTOMY     ovarian mass, not cancerous  . UPPER GI ENDOSCOPY  2015    FAMILY HISTORY Family History  Problem Relation Age of Onset  . Breast cancer Mother 69       ADVANCED DIRECTIVES:    HEALTH MAINTENANCE: Social History   Tobacco Use  . Smoking status: Never Smoker  . Smokeless tobacco: Never Used  Substance Use Topics  . Alcohol use: No  . Drug use: No     Colonoscopy:  PAP:  Bone density:  Lipid panel:  Allergies  Allergen Reactions  . Losartan Other (See Comments)    Hyperkalemia  . Morphine And Related Shortness Of Breath    Rash, difficulty breathing, nausea.   . Atenolol Other (See Comments)    Other reaction(s): Other (See Comments) Decreased heart rate Decreased heart rate  . Codeine     Rash, difficulty breathing, nausea.  . Nsaids Other (See Comments)    Other reaction(s): Unknown  . Rofecoxib     Other reaction(s): Unknown  . Sucralfate Other (See Comments)    Throat tightness  . Sulfa Antibiotics     Other reaction(s): Unknown    Current Outpatient Medications  Medication Sig Dispense Refill  . albuterol (PROVENTIL HFA;VENTOLIN HFA) 108 (90 Base) MCG/ACT inhaler Inhale 2 puffs into the lungs every 6 (six) hours as needed for wheezing or shortness of breath.    Marland Kitchen amLODipine (NORVASC) 10 MG tablet Take 10 mg by mouth daily.    . cloNIDine (CATAPRES - DOSED IN MG/24 HR) 0.2 mg/24hr patch Place 1 patch (0.2 mg total) onto the skin once a week. 4 patch 12  . clopidogrel (PLAVIX) 75 MG tablet Take 1 tablet (75 mg total) by mouth daily. 90 tablet 3  . docusate sodium (COLACE) 100 MG capsule Take 100 mg by mouth daily as needed for mild constipation. Takes daily    . doxazosin (CARDURA) 8 MG tablet TAKE ONE TABLET TWICE DAILY 180 tablet 3  . furosemide (LASIX) 40 MG tablet Take 1 tablet (40 mg total) by  mouth daily. 30 tablet 2  . hydrALAZINE (APRESOLINE) 25 MG tablet Take 1 tablet (25 mg) by mouth two times a day. (Patient taking differently: Take 25 mg by mouth. For systolic BP 308) 657 tablet 2  . ipratropium-albuterol (DUONEB) 0.5-2.5 (3) MG/3ML SOLN Take 3 mLs by nebulization every 4 (four) hours as needed (shortness of breath/wheezing.). 360 mL   . levothyroxine (SYNTHROID, LEVOTHROID) 125 MCG tablet Take 125 mcg by mouth daily.    Marland Kitchen lisinopril (PRINIVIL,ZESTRIL) 10 MG tablet Take 1 tablet (10 mg total) by mouth daily. 90 tablet 3  . metFORMIN (GLUCOPHAGE) 1000 MG tablet Take 1,000 mg by mouth 2 (two) times daily with a meal.    . Multiple Vitamin (MULTIVITAMIN) tablet Take 1 tablet by mouth daily.    Marland Kitchen oxybutynin (DITROPAN-XL) 5 MG 24 hr tablet Take 5 mg by mouth daily.     . pantoprazole (PROTONIX) 40 MG tablet Take 1 tablet (40 mg total) by mouth daily. 30 tablet 1  .  simvastatin (ZOCOR) 40 MG tablet Take 1 tablet (40 mg total) by mouth at bedtime. 90 tablet 2  . SYMBICORT 160-4.5 MCG/ACT inhaler Inhale 2 puffs into the lungs 2 (two) times daily.      No current facility-administered medications for this visit.    Facility-Administered Medications Ordered in Other Visits  Medication Dose Route Frequency Provider Last Rate Last Dose  . 0.9 %  sodium chloride infusion   Intravenous Continuous Lloyd Huger, MD      . ferumoxytol [facility]) 510 mg in sodium chloride 0.9 % 100 mL IVPB  510 mg Intravenous Once Lloyd Huger, MD        OBJECTIVE: Vitals:   10/20/17 1011  BP: (!) 173/71  Pulse: 65  Resp: 20  Temp: (!) 96.3 F (35.7 C)  SpO2: 98%     Body mass index is 33.34 kg/m.    ECOG FS:1 - Symptomatic but completely ambulatory  General: Well-developed, well-nourished, no acute distress. Eyes: Pink conjunctiva, anicteric sclera. HEENT: Normocephalic, moist mucous membranes. Lungs: Clear to auscultation bilaterally. Heart: Regular rate and rhythm. No rubs,  murmurs, or gallops. Abdomen: Soft, nontender, nondistended. No organomegaly noted, normoactive bowel sounds. Musculoskeletal: No edema, cyanosis, or clubbing. Neuro: Alert, answering all questions appropriately. Cranial nerves grossly intact. Skin: No rashes or petechiae noted. Psych: Normal affect.  LAB RESULTS:  Lab Results  Component Value Date   NA 140 05/20/2017   K 4.1 05/20/2017   CL 103 05/20/2017   CO2 26 05/20/2017   GLUCOSE 146 (H) 05/20/2017   BUN 18 05/20/2017   CREATININE 1.32 (H) 05/20/2017   CALCIUM 7.8 (L) 05/20/2017   PROT 5.9 (L) 05/15/2017   ALBUMIN 2.9 (L) 05/15/2017   AST 26 05/15/2017   ALT 11 (L) 05/15/2017   ALKPHOS 70 05/15/2017   BILITOT 0.5 05/15/2017   GFRNONAA 35 (L) 05/20/2017   GFRAA 40 (L) 05/20/2017    Lab Results  Component Value Date   WBC 5.7 10/20/2017   NEUTROABS 3.3 10/20/2017   HGB 11.1 (L) 10/20/2017   HCT 33.0 (L) 10/20/2017   MCV 94.6 10/20/2017   PLT 172 10/20/2017   Lab Results  Component Value Date   IRON 76 10/20/2017   TIBC 236 (L) 10/20/2017   IRONPCTSAT 32 (H) 10/20/2017    Lab Results  Component Value Date   FERRITIN 75 10/20/2017    STUDIES: No results found.  ASSESSMENT:  Iron deficiency anemia secondary to chronic blood loss, shortness of breath.   PLAN:    1. Iron deficiency anemia secondary to chronic blood loss: Previously, patient was reported to be intolerant of oral iron supplementation.  Patient had colonoscopy and EGD in August 2018.  Patient is symptomatically improved.  Patient's hemoglobin has trended down slightly, but is still greater than 11.0.  Her iron stores continue to be within normal limits.  She does not require additional treatment today.  She last received IV Feraheme on June 23, 2017.  Return to clinic in 2 months with repeat laboratory for further evaluation. 2. Pulmonary nodules: No intervention needed.  Repeat CT scan from January 13, 2017 revealed stable pulmonary nodules.   No further imaging is necessary.   3. Renal insufficiency: Mild, monitor. 4.  Hypertension: Patient's blood pressure is significantly elevated today.  Continue monitoring and treatment per primary care. 5.  History of GI bleed: Colonoscopy and EGD in August 2018.  Continue follow-up with GI as indicated. 6.  Hoarseness of voice: Referral to ENT.  Patient expressed understanding and was in agreement with this plan. She also understands that She can call clinic at any time with any questions, concerns, or complaints.    Lloyd Huger, MD   10/23/2017 4:38 PM

## 2017-10-19 DIAGNOSIS — H353211 Exudative age-related macular degeneration, right eye, with active choroidal neovascularization: Secondary | ICD-10-CM | POA: Diagnosis not present

## 2017-10-20 ENCOUNTER — Inpatient Hospital Stay: Payer: Medicare Other | Attending: Oncology

## 2017-10-20 ENCOUNTER — Inpatient Hospital Stay (HOSPITAL_BASED_OUTPATIENT_CLINIC_OR_DEPARTMENT_OTHER): Payer: Medicare Other | Admitting: Oncology

## 2017-10-20 ENCOUNTER — Inpatient Hospital Stay: Payer: Medicare Other

## 2017-10-20 VITALS — BP 173/71 | HR 65 | Temp 96.3°F | Resp 20 | Wt 182.3 lb

## 2017-10-20 DIAGNOSIS — R531 Weakness: Secondary | ICD-10-CM

## 2017-10-20 DIAGNOSIS — I10 Essential (primary) hypertension: Secondary | ICD-10-CM | POA: Diagnosis not present

## 2017-10-20 DIAGNOSIS — R5383 Other fatigue: Secondary | ICD-10-CM | POA: Insufficient documentation

## 2017-10-20 DIAGNOSIS — D500 Iron deficiency anemia secondary to blood loss (chronic): Secondary | ICD-10-CM

## 2017-10-20 DIAGNOSIS — N289 Disorder of kidney and ureter, unspecified: Secondary | ICD-10-CM | POA: Insufficient documentation

## 2017-10-20 DIAGNOSIS — R911 Solitary pulmonary nodule: Secondary | ICD-10-CM | POA: Insufficient documentation

## 2017-10-20 DIAGNOSIS — R0609 Other forms of dyspnea: Secondary | ICD-10-CM | POA: Insufficient documentation

## 2017-10-20 DIAGNOSIS — D509 Iron deficiency anemia, unspecified: Secondary | ICD-10-CM

## 2017-10-20 DIAGNOSIS — R490 Dysphonia: Secondary | ICD-10-CM

## 2017-10-20 LAB — CBC WITH DIFFERENTIAL/PLATELET
Basophils Absolute: 0 10*3/uL (ref 0–0.1)
Basophils Relative: 0 %
EOS ABS: 0.2 10*3/uL (ref 0–0.7)
Eosinophils Relative: 3 %
HCT: 33 % — ABNORMAL LOW (ref 35.0–47.0)
Hemoglobin: 11.1 g/dL — ABNORMAL LOW (ref 12.0–16.0)
LYMPHS ABS: 1.7 10*3/uL (ref 1.0–3.6)
Lymphocytes Relative: 30 %
MCH: 31.9 pg (ref 26.0–34.0)
MCHC: 33.7 g/dL (ref 32.0–36.0)
MCV: 94.6 fL (ref 80.0–100.0)
Monocytes Absolute: 0.5 10*3/uL (ref 0.2–0.9)
Monocytes Relative: 9 %
Neutro Abs: 3.3 10*3/uL (ref 1.4–6.5)
Neutrophils Relative %: 58 %
PLATELETS: 172 10*3/uL (ref 150–440)
RBC: 3.49 MIL/uL — ABNORMAL LOW (ref 3.80–5.20)
RDW: 13 % (ref 11.5–14.5)
WBC: 5.7 10*3/uL (ref 3.6–11.0)

## 2017-10-20 LAB — IRON AND TIBC
IRON: 76 ug/dL (ref 28–170)
Saturation Ratios: 32 % — ABNORMAL HIGH (ref 10.4–31.8)
TIBC: 236 ug/dL — ABNORMAL LOW (ref 250–450)
UIBC: 160 ug/dL

## 2017-10-20 LAB — FERRITIN: Ferritin: 75 ng/mL (ref 11–307)

## 2017-10-22 ENCOUNTER — Ambulatory Visit: Payer: Medicare Other | Admitting: Podiatry

## 2017-10-22 DIAGNOSIS — E538 Deficiency of other specified B group vitamins: Secondary | ICD-10-CM | POA: Diagnosis not present

## 2017-11-03 DIAGNOSIS — R490 Dysphonia: Secondary | ICD-10-CM | POA: Diagnosis not present

## 2017-11-05 ENCOUNTER — Ambulatory Visit (INDEPENDENT_AMBULATORY_CARE_PROVIDER_SITE_OTHER): Payer: Medicare Other | Admitting: Podiatry

## 2017-11-05 ENCOUNTER — Encounter: Payer: Self-pay | Admitting: Podiatry

## 2017-11-05 DIAGNOSIS — B351 Tinea unguium: Secondary | ICD-10-CM | POA: Diagnosis not present

## 2017-11-05 DIAGNOSIS — E119 Type 2 diabetes mellitus without complications: Secondary | ICD-10-CM

## 2017-11-05 DIAGNOSIS — M79676 Pain in unspecified toe(s): Secondary | ICD-10-CM | POA: Diagnosis not present

## 2017-11-05 DIAGNOSIS — D689 Coagulation defect, unspecified: Secondary | ICD-10-CM

## 2017-11-05 DIAGNOSIS — L608 Other nail disorders: Secondary | ICD-10-CM

## 2017-11-05 NOTE — Progress Notes (Signed)
Complaint:  Visit Type: Patient returns to my office for continued preventative foot care services. Complaint: Patient states" my nails have grown long and thick and become painful to walk and wear shoes" Patient has been diagnosed with DM with no foot complications. The patient presents for preventative foot care services. No changes to ROS.  Patient is taking plavix.  Podiatric Exam: Vascular: dorsalis pedis and posterior tibial pulses are palpable bilateral. Capillary return is immediate. Temperature gradient is WNL. Skin turgor WNL  Sensorium: Normal Semmes Weinstein monofilament test. Normal tactile sensation bilaterally. Nail Exam: Pt has thick disfigured discolored nails with subungual debris noted bilateral entire nail hallux through fifth toenails Ulcer Exam: There is no evidence of ulcer or pre-ulcerative changes or infection. Orthopedic Exam: Muscle tone and strength are WNL. No limitations in general ROM. No crepitus or effusions noted. Foot type and digits show no abnormalities. Bony prominences are unremarkable. Skin: No Porokeratosis. No infection or ulcers  Diagnosis:  Onychomycosis, , Pain in right toe, pain in left toes  Treatment & Plan Procedures and Treatment: Consent by patient was obtained for treatment procedures. The patient understood the discussion of treatment and procedures well. All questions were answered thoroughly reviewed. Debridement of mycotic and hypertrophic toenails, 1 through 5 bilateral and clearing of subungual debris. No ulceration, no infection noted. ABN signed for 2019. Return Visit-Office Procedure: Patient instructed to return to the office for a follow up visit 10 weeks  for continued evaluation and treatment.    Gardiner Barefoot DPM

## 2017-11-12 ENCOUNTER — Ambulatory Visit: Payer: Medicare Other | Admitting: Speech Pathology

## 2017-11-23 DIAGNOSIS — E538 Deficiency of other specified B group vitamins: Secondary | ICD-10-CM | POA: Diagnosis not present

## 2017-11-23 DIAGNOSIS — Z23 Encounter for immunization: Secondary | ICD-10-CM | POA: Diagnosis not present

## 2017-11-26 ENCOUNTER — Ambulatory Visit: Payer: Medicare Other | Admitting: Speech Pathology

## 2017-11-30 ENCOUNTER — Ambulatory Visit: Payer: Medicare Other | Admitting: Speech Pathology

## 2017-11-30 DIAGNOSIS — H353211 Exudative age-related macular degeneration, right eye, with active choroidal neovascularization: Secondary | ICD-10-CM | POA: Diagnosis not present

## 2017-12-01 ENCOUNTER — Ambulatory Visit: Payer: Medicare Other | Attending: Unknown Physician Specialty | Admitting: Speech Pathology

## 2017-12-01 ENCOUNTER — Other Ambulatory Visit: Payer: Self-pay

## 2017-12-01 ENCOUNTER — Encounter: Payer: Self-pay | Admitting: Speech Pathology

## 2017-12-01 DIAGNOSIS — R490 Dysphonia: Secondary | ICD-10-CM | POA: Diagnosis not present

## 2017-12-01 NOTE — Therapy (Signed)
[place] MAIN [facility] SERVICES [address] [place], [place], [address] Phone: [phone]   Fax:  [phone]  Speech Language Pathology Evaluation  Patient Details  Name: Amy [name] MRN: [id number] Date of Birth: [birth year] Referring Provider ([name]): Dr. Tami Ribas   Encounter Date: 12/01/2017  End of Session - 12/01/17 1159    Visit Number  1    Number of Visits  17    Date for [name] Re-Evaluation  01/31/18    [name] Start Time  1000    [name] Stop Time   1050    [name] Time Calculation (min)  50 min    Activity Tolerance  Patient tolerated treatment well       Past Medical History:  Diagnosis Date   Bladder incontinence    CAD (coronary artery disease)    a. 05/2009 Cath: LAD 90p (Xience 2.75 x 12 mm DES), 45m, D1 40, LCx 40p/m, RCA 30/40/30p/m, RCA 30/25d;  b.  12/2011 Lexiscan MV: no ischemia, breast attenuation artifact, normal EF-->Low risk; c. 10/2016 MV: fixed apical defect, most likely apical thinning and attenuation, No ischemia, EF 60%.   Cancer [facility])    ovarian   Carotid arterial disease w/ R Carotid Bruit (HCC)    a. 08/2016 Carotid U/S: 40-59% bilat ICA stenosis - f/u 1 yr.   Chronic diastolic (congestive) heart failure ([place])    a. Echo 09/2015: EF 55-60% w/ Grade 1 DD, sev Ca2+ MV annulus, mildly dil LA; b. 09/2016 Echo: EF 65-70%, Gr2 DD, mildly dil LA/RA, nl RV fxn.   Chronic Dyspnea on exertion    CKD (chronic kidney disease) stage 3, GFR 30-59 ml/min (HCC)    Degenerative arthritis of knee    bilateral knees   Diabetes mellitus    Type II   GIB (gastrointestinal bleeding)    a. 08/2016 Admission w/ presyncope/anemia/melena-->req Transfusion-->endo ok, colonoscopy w/ polyps but no source of bleeding (most likely diverticular).   Hiatal hernia    Hypertension    Iron deficiency    Menopausal symptoms    Morbid obesity ([place])    Renal insufficiency    Thyroid disease    hypothyroidism    Past Surgical  History:  Procedure Laterality Date   COLONOSCOPY  2015   COLONOSCOPY WITH PROPOFOL N/A 09/25/2016   Procedure: COLONOSCOPY WITH PROPOFOL;  Surgeon: [name], MD;  Location: [facility] ENDOSCOPY;  Service: Gastroenterology;  Laterality: N/A;   COLONOSCOPY WITH PROPOFOL N/A 09/26/2016   Procedure: COLONOSCOPY WITH PROPOFOL;  Surgeon: [name], MD;  Location: [facility] ENDOSCOPY;  Service: Gastroenterology;  Laterality: N/A;   ESOPHAGOGASTRODUODENOSCOPY (EGD) WITH PROPOFOL N/A 09/25/2016   Procedure: ESOPHAGOGASTRODUODENOSCOPY (EGD) WITH PROPOFOL;  Surgeon: [name], MD;  Location[facility] ENDOSCOPY;  Service: Gastroenterology;  Laterality: N/A;   REPLACEMENT TOTAL KNEE BILATERAL     TOTAL VAGINAL HYSTERECTOMY     ovarian mass, not cancerous   UPPER GI ENDOSCOPY  2015    There were no vitals filed for this visit.      [name] Evaluation [facility] - 12/01/17 0001      [name] Visit Information   [name] Received On  12/01/17    Referring Provider ([name])  Dr. Tami Ribas    Onset Date  11/05/2017    Medical Diagnosis  Dysphonia      Subjective   Subjective   "for anybody to hear me, I have to strain"    Patient/Family Stated Goal  Clear vocal quality      General Information  HPI  84 year old woman, with several months' history of hoarseness, referred by Dr. Tami Ribas for voice therapy.  The patient reports that Dr. Tami Ribas told her that "my vocal cords are spreading".      Prior Functional Status   Cognitive/Linguistic Baseline  Within functional limits      Oral Motor/Sensory Function   Overall Oral Motor/Sensory Function  Appears within functional limits for tasks assessed      Motor Speech   Overall Motor Speech  Impaired    Respiration  Impaired    Level of Impairment  Sentence    Phonation  Breathy;Hoarse;Low vocal intensity    Resonance  Within functional limits    Articulation  Within functional limitis    Intelligibility  Intelligible    Phonation  Impaired    Vocal Abuses  Habitual  Hyperphonia;Habitual Cough/Throat Clear;Vocal Fold Dehydration    Tension Present  Jaw;Neck;Shoulder    Volume  Soft    Pitch  Low      Standardized Assessments   Standardized Assessments   Other Assessment   Perceptual Voice Evaluation      Perceptual Voice Evaluation Voice checklist:  Health risks: GERD, allergies   Characteristic voice use: patient states she lives alone and does not talk a lot   Environmental risks: no significant environmental risks  Misuse: low habitual pitch, speak with strain/tension, inadequate breath support  Abuse: some coughing/throat clearing  Vocal characteristics: strained/ hoarse, limited voice range, poor vocal projection, excessive pharyngeal resonance, low habitual pitch Maximum phonation time for sustained ah: 3 seconds Average fundamental frequency during sustained ah: 201 Hz (1.6 STD below average for gender) Habitual pitch: 183 Hz Highest dynamic pitch when altering pitch from a low note to a high note: 247 Hz Lowest dynamic pitch when altering from a high note to a low note: 103 Hz Highest dynamic pitch in conversational speech: 208 Hz Lowest dynamic pitch in conversational speech: 74 Hz Average time patient was able to sustain /s/: 3 seconds Average time patient was able to sustain /z/: 3 seconds s/z ratio : 1 Visi-Pitch: Multi-Dimensional Voice Program (MDVP)  MDVP extracts objective quantitative values (Relative Average Perturbation, Shimmer, Voice Turbulence Index, and Noise to Harmonic Ratio) on sustained phonation, which are displayed graphically and numerically in comparison to a built-in normative database.  The patient exhibited values outside the norm for Relative Average Perturbation, Shimmer, and Noise to Harmonic Ratio.  Average fundamental frequency was 1.6 STD below average for age and gender.  Patient able to improve all parameters with model (Loud like me) Education: Patient instructed in extrinsic laryngeal muscle  stretches and breath support exercises   [name] Education - 12/01/17 1158    Education Details  Voice therapy    Person(s) Educated  Patient    Methods  Explanation    Comprehension  Verbalized understanding         [name] Long Term Goals - 12/01/17 1201      [name] LONG TERM GOAL #1   Title  The patient will demonstrate independent understanding of vocal hygiene concepts and extrinsic laryngeal muscle stretches.      Time  4    Period  Weeks    Status  New    Target Date  01/01/18      [name] LONG TERM GOAL #2   Title  The patient will be independent for abdominal breathing and breath support exercises.    Time  4    Period  Weeks    Status  New    Target Date  01/01/18      [name] LONG TERM GOAL #3   Title  The patient will minimize vocal tension via resonant voice therapy (or comparable technique) with min [name] cues with 80% accuracy.    Time  4    Period  Weeks    Status  New    Target Date  01/01/18      [name] LONG TERM GOAL #4   Title  The patient will maximize voice quality and loudness using breath support/oral resonance for paragraph length recitation with 80% accuracy.    Time  8    Period  Weeks    Status  New    Target Date  01/31/18       Plan - 12/01/17 1200    Clinical Impression Statement  This 84 year old woman under the care of Dr. Tami Ribas, with presbylarynges, is presenting with moderate dysphonia.  The patient demonstrates hoarse vocal quality, reduced breath control for speech, excessive pharyngeal resonance, strained/tense phonation, limited pitch range, vocal fatigue, and laryngeal tension. She will benefit from voice therapy for education, to improve breath support, improve tone focus, promote easy flow phonation, and learn techniques to increase loudness and pitch range without strain.    Speech Therapy Frequency  2x / week    Duration  Other (comment)   8 weeks   Treatment/Interventions  [name] instruction and feedback;Patient/family education;Other (comment)    Voice therapy   Potential to Achieve Goals  Good    Potential Considerations  Ability to learn/carryover information;Severity of impairments;Co-morbidities;Cooperation/participation level;Family/community support;Medical prognosis    [name] Home Exercise Plan  Provided    Consulted and Agree with Plan of Care  Patient       Patient will benefit from skilled therapeutic intervention in order to improve the following deficits and impairments:   Dysphonia - Plan: [name] plan of care cert/re-cert    Problem List Patient Active Problem List   Diagnosis Date Noted   Diabetes ([place]) 06/29/2017   CAP (community acquired pneumonia) 05/16/2017   GI bleed 09/21/2016   Vitamin D deficiency 11/04/2015   Multiple lung nodules    Bronchitis with obstruction ([place])    New onset atrial fibrillation ([place]) 10/04/2015   Anemia 10/04/2015   Advanced directives, counseling/discussion 06/05/2015   Morbid obesity ([place]) 12/05/2014   Angina pectoris associated with type 2 diabetes mellitus ([place]) 12/05/2014   Chronic diastolic CHF (congestive heart failure) ([place]) 09/06/2013   Preop cardiovascular exam 06/03/2012   Iron deficiency anemia 09/08/2011   Hyperlipidemia 07/18/2009   CAD, NATIVE VESSEL 07/18/2009   HYPOTHYROIDISM-IATROGENIC 07/12/2009   Essential hypertension 07/12/2009   Amy [name], Amy [name]  Amy [name] 12/01/2017, 12:05 PM  [place] MAIN [facility] SERVICES [address] [place], [place], [address] Phone: [phone]   Fax:  [phone]  Name: Amy [name] MRN: [id number] Date of Birth: [birth year]

## 2017-12-02 ENCOUNTER — Other Ambulatory Visit: Payer: Self-pay | Admitting: Internal Medicine

## 2017-12-02 DIAGNOSIS — Z1231 Encounter for screening mammogram for malignant neoplasm of breast: Secondary | ICD-10-CM

## 2017-12-03 ENCOUNTER — Encounter: Payer: Self-pay | Admitting: Speech Pathology

## 2017-12-03 ENCOUNTER — Ambulatory Visit: Payer: Medicare Other | Admitting: Speech Pathology

## 2017-12-03 DIAGNOSIS — R490 Dysphonia: Secondary | ICD-10-CM | POA: Diagnosis not present

## 2017-12-03 NOTE — Therapy (Signed)
[place] MAIN [facility] SERVICES [address] [place], [place], [address] Phone: [phone]   Fax:  [phone]  Speech Language Pathology Treatment  Patient Details  Name: Amy [name] MRN: [id number] Date of Birth: Jun 29, 1927 Referring Provider (SLP): Dr. Tami Ribas   Encounter Date: 12/03/2017  End of Session - 12/03/17 1201    Visit Number  2    Number of Visits  17    Date for SLP Re-Evaluation  01/31/18    SLP Start Time  0177    SLP Stop Time   1136    SLP Time Calculation (min)  51 min    Activity Tolerance  Patient tolerated treatment well       Past Medical History:  Diagnosis Date  . Bladder incontinence   . CAD (coronary artery disease)    a. 05/2009 Cath: LAD 90p (Xience 2.75 x 12 mm DES), 37m, D1 40, LCx 40p/m, RCA 30/40/30p/m, RCA 30/25d;  b.  12/2011 Lexiscan MV: no ischemia, breast attenuation artifact, normal EF-->Low risk; c. 10/2016 MV: fixed apical defect, most likely apical thinning and attenuation, No ischemia, EF 60%.  . Cancer ([place])    ovarian  . Carotid arterial disease w/ R Carotid Bruit (HCC)    a. 08/2016 Carotid U/S: 40-59% bilat ICA stenosis - f/u 1 yr.  . Chronic diastolic (congestive) heart failure ([place])    a. Echo 09/2015: EF 55-60% w/ Grade 1 DD, sev Ca2+ MV annulus, mildly dil LA; b. 09/2016 Echo: EF 65-70%, Gr2 DD, mildly dil LA/RA, nl RV fxn.  . Chronic Dyspnea on exertion   . CKD (chronic kidney disease) stage 3, GFR 30-59 ml/min (HCC)   . Degenerative arthritis of knee    bilateral knees  . Diabetes mellitus    Type II  . GIB (gastrointestinal bleeding)    a. 08/2016 Admission w/ presyncope/anemia/melena-->req Transfusion-->endo ok, colonoscopy w/ polyps but no source of bleeding (most likely diverticular).  . Hiatal hernia   . Hypertension   . Iron deficiency   . Menopausal symptoms   . Morbid obesity ([place])   . Renal insufficiency   . Thyroid disease    hypothyroidism    Past Surgical  History:  Procedure Laterality Date  . COLONOSCOPY  2015  . COLONOSCOPY WITH PROPOFOL N/A 09/25/2016   Procedure: COLONOSCOPY WITH PROPOFOL;  Surgeon: [name], MD;  Location: [facility] ENDOSCOPY;  Service: Gastroenterology;  Laterality: N/A;  . COLONOSCOPY WITH PROPOFOL N/A 09/26/2016   Procedure: COLONOSCOPY WITH PROPOFOL;  Surgeon: [name], MD;  Location: [facility] ENDOSCOPY;  Service: Gastroenterology;  Laterality: N/A;  . ESOPHAGOGASTRODUODENOSCOPY (EGD) WITH PROPOFOL N/A 09/25/2016   Procedure: ESOPHAGOGASTRODUODENOSCOPY (EGD) WITH PROPOFOL;  Surgeon: [name], MD;  Location: [facility]  ENDOSCOPY;  Service: Gastroenterology;  Laterality: N/A;  . REPLACEMENT TOTAL KNEE BILATERAL    . TOTAL VAGINAL HYSTERECTOMY     ovarian mass, not cancerous  . UPPER GI ENDOSCOPY  2015    There were no vitals filed for this visit.  Subjective Assessment - 12/03/17 1200    Subjective  "My voice isn't better"            ADULT SLP TREATMENT - 12/03/17 0001      General Information   Behavior/Cognition  Alert;Cooperative;Pleasant mood    HPI   84 year old woman, with several months' history of hoarseness, referred by Dr. Tami Ribas for voice therapy.  The patient reports that Dr. Tami Ribas told her that "my vocal cords are spreading".  Treatment Provided   Treatment provided  Cognitive-Linquistic      Pain Assessment   Pain Assessment  No/denies pain      Cognitive-Linquistic Treatment   Treatment focused on  Voice    Skilled Treatment  The patient was provided with written and verbal teaching regarding neck, shoulder, tongue, and throat stretches exercises to promote relaxed phonation. The patient was provided with written and verbal teaching for supplemental vocal tract relaxation exercises (straw phonation) - the patient is not able to generate an un-tensed phonation. The patient was provided with written and verbal teaching regarding breath support exercises.  The patient has very little respiratory  support- we have added several blowing exercises as well as requesting that she find and use her incentive spirometer.  Multiple techniques were introduced to promote relaxed phonation.  The patient was most successful with semi-occluded/modified resonant voice technique.  Patient able to more consistently generate clear voice with sustained /w/+vowel and /w/+vowel siren.      Assessment / Recommendations / Plan   Plan  Continue with current plan of care      Progression Toward Goals   Progression toward goals  Progressing toward goals       SLP Education - 12/03/17 1200    Education Details  reminded that her vocal quality is better in the semi-occluded vocal tract task    Person(s) Educated  Patient    Methods  Explanation    Comprehension  Verbalized understanding         SLP Long Term Goals - 12/01/17 1201      SLP LONG TERM GOAL #1   Title  The patient will demonstrate independent understanding of vocal hygiene concepts and extrinsic laryngeal muscle stretches.      Time  4    Period  Weeks    Status  New    Target Date  01/01/18      SLP LONG TERM GOAL #2   Title  The patient will be independent for abdominal breathing and breath support exercises.    Time  4    Period  Weeks    Status  New    Target Date  01/01/18      SLP LONG TERM GOAL #3   Title  The patient will minimize vocal tension via resonant voice therapy (or comparable technique) with min SLP cues with 80% accuracy.    Time  4    Period  Weeks    Status  New    Target Date  01/01/18      SLP LONG TERM GOAL #4   Title  The patient will maximize voice quality and loudness using breath support/oral resonance for paragraph length recitation with 80% accuracy.    Time  8    Period  Weeks    Status  New    Target Date  01/31/18       Plan - 12/03/17 1201    Clinical Impression Statement  Patient able to improve vocal quality with semi-occlude vocal tract to improve oral resonance and decrease laryngeal  strain, albeit with poor consistency.   The patient has poor respiratory support and has developed maladaptive behaviors to compensate (strained phonation).    Speech Therapy Frequency  2x / week    Duration  Other (comment)    Treatment/Interventions  SLP instruction and feedback;Patient/family education;Other (comment)   Voice therapy   Potential to Achieve Goals  Good    Potential Considerations  Ability to learn/carryover information;Severity of  impairments;Co-morbidities;Cooperation/participation level;Family/community support;Medical prognosis    SLP Home Exercise Plan  Provided    Consulted and Agree with Plan of Care  Patient       Patient will benefit from skilled therapeutic intervention in order to improve the following deficits and impairments:   Dysphonia    Problem List Patient Active Problem List   Diagnosis Date Noted  . Diabetes ([place]) 06/29/2017  . CAP (community acquired pneumonia) 05/16/2017  . GI bleed 09/21/2016  . Vitamin D deficiency 11/04/2015  . Multiple lung nodules   . Bronchitis with obstruction ([place])   . New onset atrial fibrillation ([place]) 10/04/2015  . Anemia 10/04/2015  . Advanced directives, counseling/discussion 06/05/2015  . Morbid obesity ([place]) 12/05/2014  . Angina pectoris associated with type 2 diabetes mellitus ([place]) 12/05/2014  . Chronic diastolic CHF (congestive heart failure) ([place]) 09/06/2013  . Preop cardiovascular exam 06/03/2012  . Iron deficiency anemia 09/08/2011  . Hyperlipidemia 07/18/2009  . CAD, NATIVE VESSEL 07/18/2009  . HYPOTHYROIDISM-IATROGENIC 07/12/2009  . Essential hypertension 07/12/2009   Leroy Sea, MS/CCC- SLP  Lou Miner 12/03/2017, 12:02 PM  [place] MAIN [facility] SERVICES [address] [place], [place], [address] Phone: [phone]   Fax:  [phone]   Name: Amy [name] MRN: [id number] Date of Birth: Jun 22, 1927

## 2017-12-07 ENCOUNTER — Ambulatory Visit: Payer: Medicare Other | Admitting: Speech Pathology

## 2017-12-07 DIAGNOSIS — H353222 Exudative age-related macular degeneration, left eye, with inactive choroidal neovascularization: Secondary | ICD-10-CM | POA: Diagnosis not present

## 2017-12-09 ENCOUNTER — Ambulatory Visit: Payer: Medicare Other | Admitting: Speech Pathology

## 2017-12-09 ENCOUNTER — Encounter: Payer: Self-pay | Admitting: Speech Pathology

## 2017-12-09 DIAGNOSIS — R490 Dysphonia: Secondary | ICD-10-CM | POA: Diagnosis not present

## 2017-12-09 NOTE — Therapy (Signed)
[place] MAIN [facility] SERVICES [address] [place], [place], [address] Phone: [phone]   Fax:  [phone]  Speech Language Pathology Treatment  Patient Details  Name: Amy [name] MRN: [id number] Date of Birth: 27-Nov-1927 Referring Provider (SLP): Dr. Tami Ribas   Encounter Date: 12/09/2017  End of Session - 12/09/17 1000    Visit Number  3    Date for SLP Re-Evaluation  01/31/18    SLP Start Time  0900    SLP Stop Time   0950    SLP Time Calculation (min)  50 min    Activity Tolerance  Patient tolerated treatment well       Past Medical History:  Diagnosis Date  . Bladder incontinence   . CAD (coronary artery disease)    a. 05/2009 Cath: LAD 90p (Xience 2.75 x 12 mm DES), 23m, D1 40, LCx 40p/m, RCA 30/40/30p/m, RCA 30/25d;  b.  12/2011 Lexiscan MV: no ischemia, breast attenuation artifact, normal EF-->Low risk; c. 10/2016 MV: fixed apical defect, most likely apical thinning and attenuation, No ischemia, EF 60%.  . Cancer ([place])    ovarian  . Carotid arterial disease w/ R Carotid Bruit (HCC)    a. 08/2016 Carotid U/S: 40-59% bilat ICA stenosis - f/u 1 yr.  . Chronic diastolic (congestive) heart failure ([place])    a. Echo 09/2015: EF 55-60% w/ Grade 1 DD, sev Ca2+ MV annulus, mildly dil LA; b. 09/2016 Echo: EF 65-70%, Gr2 DD, mildly dil LA/RA, nl RV fxn.  . Chronic Dyspnea on exertion   . CKD (chronic kidney disease) stage 3, GFR 30-59 ml/min (HCC)   . Degenerative arthritis of knee    bilateral knees  . Diabetes mellitus    Type II  . GIB (gastrointestinal bleeding)    a. 08/2016 Admission w/ presyncope/anemia/melena-->req Transfusion-->endo ok, colonoscopy w/ polyps but no source of bleeding (most likely diverticular).  . Hiatal hernia   . Hypertension   . Iron deficiency   . Menopausal symptoms   . Morbid obesity ([place])   . Renal insufficiency   . Thyroid disease    hypothyroidism    Past Surgical History:  Procedure  Laterality Date  . COLONOSCOPY  2015  . COLONOSCOPY WITH PROPOFOL N/A 09/25/2016   Procedure: COLONOSCOPY WITH PROPOFOL;  Surgeon: [name], MD;  [facility] ENDOSCOPY;  Service: Gastroenterology;  Laterality: N/A;  . COLONOSCOPY WITH PROPOFOL N/A 09/26/2016   Procedure: COLONOSCOPY WITH PROPOFOL;  Surgeon: [name], MD;  Location: [facility] ENDOSCOPY;  Service: Gastroenterology;  Laterality: N/A;  . ESOPHAGOGASTRODUODENOSCOPY (EGD) WITH PROPOFOL N/A 09/25/2016   Procedure: ESOPHAGOGASTRODUODENOSCOPY (EGD) WITH PROPOFOL;  Surgeon: [name], MD;  Location: [facility] ENDOSCOPY;  Service: Gastroenterology;  Laterality: N/A;  . REPLACEMENT TOTAL KNEE BILATERAL    . TOTAL VAGINAL HYSTERECTOMY     ovarian mass, not cancerous  . UPPER GI ENDOSCOPY  2015    There were no vitals filed for this visit.  Subjective Assessment - 12/09/17 0958    Subjective  "I sound better- this is helping"            ADULT SLP TREATMENT - 12/09/17 0001      General Information   Behavior/Cognition  Alert;Cooperative;Pleasant mood    HPI   84 year old woman, with several months' history of hoarseness, referred by Dr. Tami Ribas for voice therapy.  The patient reports that Dr. Tami Ribas told her that "my vocal cords are spreading".       Treatment Provided  Treatment provided  Cognitive-Linquistic      Pain Assessment   Pain Assessment  No/denies pain      Cognitive-Linquistic Treatment   Treatment focused on  Voice    Skilled Treatment  The patient was provided with written and verbal teaching regarding neck, shoulder, tongue, and throat stretches exercises to promote relaxed phonation. The patient was provided with written and verbal teaching for supplemental vocal tract relaxation exercises (straw phonation) - the patient is not able to generate an un-tensed phonation. The patient was provided with written and verbal teaching regarding breath support exercises.  The patient has very little respiratory support-  we have added several blowing exercises as well as requesting that she find and use her incentive spirometer.  Multiple techniques were introduced to promote relaxed phonation.  The patient was most successful with semi-occluded/modified resonant voice technique.  Patient able to generate clear voice with sustained /w/+vowel, /w/+vowel siren, initial /w/ words, reading short phrases/sentences, and generating short spontaneous responses with 80% accuracy.      Assessment / Recommendations / Plan   Plan  Continue with current plan of care      Progression Toward Goals   Progression toward goals  Progressing toward goals       SLP Education - 12/09/17 0959    Education Details  let yourself breathe vs. straing when you feel breathless     Person(s) Educated  Patient    Methods  Explanation         SLP Long Term Goals - 12/01/17 1201      SLP LONG TERM GOAL #1   Title  The patient will demonstrate independent understanding of vocal hygiene concepts and extrinsic laryngeal muscle stretches.      Time  4    Period  Weeks    Status  New    Target Date  01/01/18      SLP LONG TERM GOAL #2   Title  The patient will be independent for abdominal breathing and breath support exercises.    Time  4    Period  Weeks    Status  New    Target Date  01/01/18      SLP LONG TERM GOAL #3   Title  The patient will minimize vocal tension via resonant voice therapy (or comparable technique) with min SLP cues with 80% accuracy.    Time  4    Period  Weeks    Status  New    Target Date  01/01/18      SLP LONG TERM GOAL #4   Title  The patient will maximize voice quality and loudness using breath support/oral resonance for paragraph length recitation with 80% accuracy.    Time  8    Period  Weeks    Status  New    Target Date  01/31/18       Plan - 12/09/17 1000    Clinical Impression Statement  Patient able to improve vocal quality with semi-occlude vocal tract to improve oral resonance and  decrease laryngeal strain with greater consistency.   The patient has poor respiratory support and has developed maladaptive behaviors to compensate (strained phonation).  The patient is demonstrating emerging generalization of relaxed phonation into her speech.    Speech Therapy Frequency  2x / week    Duration  Other (comment)    Treatment/Interventions  SLP instruction and feedback;Patient/family education;Other (comment)   Voice therapy   Potential Considerations  Ability to learn/carryover information;Severity of  impairments;Co-morbidities;Cooperation/participation level;Family/community support;Medical prognosis    SLP Home Exercise Plan  Provided    Consulted and Agree with Plan of Care  Patient       Patient will benefit from skilled therapeutic intervention in order to improve the following deficits and impairments:   Dysphonia    Problem List Patient Active Problem List   Diagnosis Date Noted  . Diabetes ([place]) 06/29/2017  . CAP (community acquired pneumonia) 05/16/2017  . GI bleed 09/21/2016  . Vitamin D deficiency 11/04/2015  . Multiple lung nodules   . Bronchitis with obstruction ([place])   . New onset atrial fibrillation ([place]) 10/04/2015  . Anemia 10/04/2015  . Advanced directives, counseling/discussion 06/05/2015  . Morbid obesity ([place]) 12/05/2014  . Angina pectoris associated with type 2 diabetes mellitus ([place]) 12/05/2014  . Chronic diastolic CHF (congestive heart failure) ([place]) 09/06/2013  . Preop cardiovascular exam 06/03/2012  . Iron deficiency anemia 09/08/2011  . Hyperlipidemia 07/18/2009  . CAD, NATIVE VESSEL 07/18/2009  . HYPOTHYROIDISM-IATROGENIC 07/12/2009  . Essential hypertension 07/12/2009   Leroy Sea, MS/CCC- SLP  Lou Miner 12/09/2017, 10:01 AM  [place] MAIN [facility] SERVICES [address] [place], [place], [address] Phone: [phone]   Fax:  [phone]   Name: Amy [name] MRN:  [id number] Date of Birth: 08-09-1927

## 2017-12-10 ENCOUNTER — Ambulatory Visit: Payer: Medicare Other | Admitting: Speech Pathology

## 2017-12-11 ENCOUNTER — Ambulatory Visit: Payer: Medicare Other | Admitting: Speech Pathology

## 2017-12-14 ENCOUNTER — Ambulatory Visit: Payer: Medicare Other | Admitting: Speech Pathology

## 2017-12-15 ENCOUNTER — Ambulatory Visit: Payer: Medicare Other | Admitting: Speech Pathology

## 2017-12-17 ENCOUNTER — Ambulatory Visit: Payer: Medicare Other | Admitting: Speech Pathology

## 2017-12-17 ENCOUNTER — Other Ambulatory Visit: Payer: Self-pay | Admitting: Physician Assistant

## 2017-12-18 ENCOUNTER — Encounter: Payer: Self-pay | Admitting: Speech Pathology

## 2017-12-18 ENCOUNTER — Ambulatory Visit: Payer: Medicare Other | Admitting: Speech Pathology

## 2017-12-18 DIAGNOSIS — R490 Dysphonia: Secondary | ICD-10-CM

## 2017-12-18 NOTE — Therapy (Signed)
[place] MAIN [facility] SERVICES [address] [place], [place], [address] Phone: [phone]   Fax:  [phone]  Speech Language Pathology Treatment  Patient Details  Name: Amy [name] MRN: [id number] Date of Birth: 08-12-1927 Referring Provider (SLP): Dr. Tami Ribas   Encounter Date: 12/18/2017  End of Session - 12/18/17 1231    Visit Number  4    Number of Visits  17    Date for SLP Re-Evaluation  01/31/18    SLP Start Time  1000    SLP Stop Time   1100    SLP Time Calculation (min)  60 min    Activity Tolerance  Patient tolerated treatment well       Past Medical History:  Diagnosis Date  . Bladder incontinence   . CAD (coronary artery disease)    a. 05/2009 Cath: LAD 90p (Xience 2.75 x 12 mm DES), 13m, D1 40, LCx 40p/m, RCA 30/40/30p/m, RCA 30/25d;  b.  12/2011 Lexiscan MV: no ischemia, breast attenuation artifact, normal EF-->Low risk; c. 10/2016 MV: fixed apical defect, most likely apical thinning and attenuation, No ischemia, EF 60%.  . Cancer ([place])    ovarian  . Carotid arterial disease w/ R Carotid Bruit (HCC)    a. 08/2016 Carotid U/S: 40-59% bilat ICA stenosis - f/u 1 yr.  . Chronic diastolic (congestive) heart failure ([place])    a. Echo 09/2015: EF 55-60% w/ Grade 1 DD, sev Ca2+ MV annulus, mildly dil LA; b. 09/2016 Echo: EF 65-70%, Gr2 DD, mildly dil LA/RA, nl RV fxn.  . Chronic Dyspnea on exertion   . CKD (chronic kidney disease) stage 3, GFR 30-59 ml/min (HCC)   . Degenerative arthritis of knee    bilateral knees  . Diabetes mellitus    Type II  . GIB (gastrointestinal bleeding)    a. 08/2016 Admission w/ presyncope/anemia/melena-->req Transfusion-->endo ok, colonoscopy w/ polyps but no source of bleeding (most likely diverticular).  . Hiatal hernia   . Hypertension   . Iron deficiency   . Menopausal symptoms   . Morbid obesity ([place])   . Renal insufficiency   . Thyroid disease    hypothyroidism    Past Surgical  History:  Procedure Laterality Date  . COLONOSCOPY  2015  . COLONOSCOPY WITH PROPOFOL N/A 09/25/2016   Procedure: COLONOSCOPY WITH PROPOFOL;  Surgeon: [name], MD;  Location: [facility] ENDOSCOPY;  Service: Gastroenterology;  Laterality: N/A;  . COLONOSCOPY WITH PROPOFOL N/A 09/26/2016   Procedure: COLONOSCOPY WITH PROPOFOL;  Surgeon: [name], MD;  Location: [facility] ENDOSCOPY;  Service: Gastroenterology;  Laterality: N/A;  . ESOPHAGOGASTRODUODENOSCOPY (EGD) WITH PROPOFOL N/A 09/25/2016   Procedure: ESOPHAGOGASTRODUODENOSCOPY (EGD) WITH PROPOFOL;  Surgeon: [name], MD;  Location: [facility] ENDOSCOPY;  Service: Gastroenterology;  Laterality: N/A;  . REPLACEMENT TOTAL KNEE BILATERAL    . TOTAL VAGINAL HYSTERECTOMY     ovarian mass, not cancerous  . UPPER GI ENDOSCOPY  2015    There were no vitals filed for this visit.  Subjective Assessment - 12/18/17 1229    Subjective  "I sound better- this is helping"            ADULT SLP TREATMENT - 12/18/17 0001      General Information   Behavior/Cognition  Alert;Cooperative;Pleasant mood    HPI   84 year old woman, with several months' history of hoarseness, referred by Dr. Tami Ribas for voice therapy.  The patient reports that Dr. Tami Ribas told her that "my vocal cords are spreading".  Treatment Provided   Treatment provided  Cognitive-Linquistic      Pain Assessment   Pain Assessment  No/denies pain      Cognitive-Linquistic Treatment   Treatment focused on  Voice    Skilled Treatment  The patient was provided with written and verbal teaching regarding neck, shoulder, tongue, and throat stretches exercises to promote relaxed phonation. The patient was provided with written and verbal teaching for supplemental vocal tract relaxation exercises (straw phonation) - the patient is not able to generate an un-tensed phonation. The patient was provided with written and verbal teaching regarding breath support exercises.  The patient has very little  respiratory support- we have added several blowing exercises as well as requesting that she find and use her incentive spirometer.  Multiple techniques were introduced to promote relaxed phonation.  The patient was most successful with breath support/vocal loudness strategy.  Patient able to generate clear voice while reading and generating short sentences using INHALE-speak loudly-EXHALE strategy.  She appears to have been trying to conserve her breath via breath holding.        Assessment / Recommendations / Plan   Plan  Continue with current plan of care      Progression Toward Goals   Progression toward goals  Progressing toward goals       SLP Education - 12/18/17 1230    Education Details  INHALE-speak loudly-EXHALE    Person(s) Educated  Patient    Methods  Explanation;Handout    Comprehension  Verbalized understanding;Need further instruction         SLP Long Term Goals - 12/01/17 1201      SLP LONG TERM GOAL #1   Title  The patient will demonstrate independent understanding of vocal hygiene concepts and extrinsic laryngeal muscle stretches.      Time  4    Period  Weeks    Status  New    Target Date  01/01/18      SLP LONG TERM GOAL #2   Title  The patient will be independent for abdominal breathing and breath support exercises.    Time  4    Period  Weeks    Status  New    Target Date  01/01/18      SLP LONG TERM GOAL #3   Title  The patient will minimize vocal tension via resonant voice therapy (or comparable technique) with min SLP cues with 80% accuracy.    Time  4    Period  Weeks    Status  New    Target Date  01/01/18      SLP LONG TERM GOAL #4   Title  The patient will maximize voice quality and loudness using breath support/oral resonance for paragraph length recitation with 80% accuracy.    Time  8    Period  Weeks    Status  New    Target Date  01/31/18       Plan - 12/18/17 1232    Clinical Impression Statement  Patient able to improve vocal  quality with INHALE-speak loudly-EXHALE strategy with greater consistency.   The patient has poor respiratory support and has developed maladaptive behaviors to compensate (strained phonation).  The patient is demonstrating emerging generalization of relaxed phonation into her speech.    Speech Therapy Frequency  2x / week    Duration  Other (comment)    Treatment/Interventions  SLP instruction and feedback;Patient/family education;Other (comment)   Voice therapy   Potential to Achieve Goals  Good    Potential Considerations  Ability to learn/carryover information;Severity of impairments;Co-morbidities;Cooperation/participation level;Family/community support;Medical prognosis    SLP Home Exercise Plan  Provided    Consulted and Agree with Plan of Care  Patient       Patient will benefit from skilled therapeutic intervention in order to improve the following deficits and impairments:   Dysphonia    Problem List Patient Active Problem List   Diagnosis Date Noted  . Diabetes ([place]) 06/29/2017  . CAP (community acquired pneumonia) 05/16/2017  . GI bleed 09/21/2016  . Vitamin D deficiency 11/04/2015  . Multiple lung nodules   . Bronchitis with obstruction ([place])   . New onset atrial fibrillation ([place]) 10/04/2015  . Anemia 10/04/2015  . Advanced directives, counseling/discussion 06/05/2015  . Morbid obesity ([place]) 12/05/2014  . Angina pectoris associated with type 2 diabetes mellitus ([place]) 12/05/2014  . Chronic diastolic CHF (congestive heart failure) ([place]) 09/06/2013  . Preop cardiovascular exam 06/03/2012  . Iron deficiency anemia 09/08/2011  . Hyperlipidemia 07/18/2009  . CAD, NATIVE VESSEL 07/18/2009  . HYPOTHYROIDISM-IATROGENIC 07/12/2009  . Essential hypertension 07/12/2009   Leroy Sea, MS/CCC- SLP  Lou Miner 12/18/2017, 12:33 PM  [place] MAIN [facility] SERVICES [address] [place], [place], [address] Phone: [phone]    Fax:  [phone]   Name: Amy [name] MRN: [id number] Date of Birth: 07-20-1927

## 2017-12-20 NOTE — Progress Notes (Signed)
[place]  [phone] [phone]  ID: Amy [name] OB: 1927-04-13  MR#: [id number]  BHA#:[id number]  Patient Care Team: [name], MD as PCP - General (Internal Medicine) Rockey Situ Kathlene November, MD as PCP - Cardiology (Cardiology) Minna Merritts, MD as Consulting Physician (Cardiology)  CHIEF COMPLAINT: Iron deficiency anemia secondary to chronic blood loss.  INTERVAL HISTORY: Patient returns to clinic today for repeat laboratory work and further evaluation.  She continues to have chronic weakness and fatigue that is unchanged.  Her hoarseness of voice has improved.  She otherwise feels well.  She has not noted any further GI bleeds or changes in her bowel movements. She has no neurologic complaints. She denies any recent fevers or illnesses. She denies any chest pain, cough, or hemoptysis.  She reports continued chronic shortness of breath.  She denies any nausea, vomiting, constipation, or diarrhea.  She denies any further melena or hematochezia.  She has no urinary complaints.  Patient offers no further specific complaints today.  REVIEW OF SYSTEMS:   Review of Systems  Constitutional: Positive for malaise/fatigue. Negative for fever and weight loss.  HENT: Negative.  Negative for sore throat.   Respiratory: Positive for shortness of breath. Negative for cough, hemoptysis and stridor.   Cardiovascular: Negative.  Negative for chest pain and leg swelling.  Gastrointestinal: Negative.  Negative for abdominal pain, blood in stool and melena.  Genitourinary: Negative.  Negative for hematuria.  Musculoskeletal: Negative.  Negative for back pain.  Skin: Negative.  Negative for rash.  Neurological: Positive for weakness. Negative for sensory change, focal weakness and headaches.  Psychiatric/Behavioral: Negative.  The patient is not nervous/anxious.     As per HPI. Otherwise, a complete review of systems is negative.  PAST MEDICAL  HISTORY: Past Medical History:  Diagnosis Date  . Bladder incontinence   . CAD (coronary artery disease)    a. 05/2009 Cath: LAD 90p (Xience 2.75 x 12 mm DES), 30m, D1 40, LCx 40p/m, RCA 30/40/30p/m, RCA 30/25d;  b.  12/2011 Lexiscan MV: no ischemia, breast attenuation artifact, normal EF-->Low risk; c. 10/2016 MV: fixed apical defect, most likely apical thinning and attenuation, No ischemia, EF 60%.  . Cancer ([place])    ovarian  . Carotid arterial disease w/ R Carotid Bruit (HCC)    a. 08/2016 Carotid U/S: 40-59% bilat ICA stenosis - f/u 1 yr.  . Chronic diastolic (congestive) heart failure ([place])    a. Echo 09/2015: EF 55-60% w/ Grade 1 DD, sev Ca2+ MV annulus, mildly dil LA; b. 09/2016 Echo: EF 65-70%, Gr2 DD, mildly dil LA/RA, nl RV fxn.  . Chronic Dyspnea on exertion   . CKD (chronic kidney disease) stage 3, GFR 30-59 ml/min (HCC)   . Degenerative arthritis of knee    bilateral knees  . Diabetes mellitus    Type II  . GIB (gastrointestinal bleeding)    a. 08/2016 Admission w/ presyncope/anemia/melena-->req Transfusion-->endo ok, colonoscopy w/ polyps but no source of bleeding (most likely diverticular).  . Hiatal hernia   . Hypertension   . Iron deficiency   . Menopausal symptoms   . Morbid obesity ([place])   . Renal insufficiency   . Thyroid disease    hypothyroidism    PAST SURGICAL HISTORY: Past Surgical History:  Procedure Laterality Date  . COLONOSCOPY  2015  . COLONOSCOPY WITH PROPOFOL N/A 09/25/2016   Procedure: COLONOSCOPY WITH PROPOFOL;  Surgeon: [name], MD;  Location: [facility] ENDOSCOPY;  Service: Gastroenterology;  Laterality: N/A;  .  COLONOSCOPY WITH PROPOFOL N/A 09/26/2016   Procedure: COLONOSCOPY WITH PROPOFOL;  Surgeon: [name], MD;  Location: [facility] ENDOSCOPY;  Service: Gastroenterology;  Laterality: N/A;  . ESOPHAGOGASTRODUODENOSCOPY (EGD) WITH PROPOFOL N/A 09/25/2016   Procedure: ESOPHAGOGASTRODUODENOSCOPY (EGD) WITH PROPOFOL;  Surgeon: [name], MD;  Location: [facility]  ENDOSCOPY;  Service: Gastroenterology;  Laterality: N/A;  . REPLACEMENT TOTAL KNEE BILATERAL    . TOTAL VAGINAL HYSTERECTOMY     ovarian mass, not cancerous  . UPPER GI ENDOSCOPY  2015    FAMILY HISTORY Family History  Problem Relation Age of Onset  . Breast cancer Mother 110       ADVANCED DIRECTIVES:    HEALTH MAINTENANCE: Social History   Tobacco Use  . Smoking status: Never Smoker  . Smokeless tobacco: Never Used  Substance Use Topics  . Alcohol use: No  . Drug use: No     Colonoscopy:  PAP:  Bone density:  Lipid panel:  Allergies  Allergen Reactions  . Losartan Other (See Comments)    Hyperkalemia  . Morphine And Related Shortness Of Breath    Rash, difficulty breathing, nausea.   . Atenolol Other (See Comments)    Other reaction(s): Other (See Comments) Decreased heart rate Decreased heart rate  . Codeine     Rash, difficulty breathing, nausea.  . Nsaids Other (See Comments)    Other reaction(s): Unknown  . Rofecoxib     Other reaction(s): Unknown  . Sucralfate Other (See Comments)    Throat tightness  . Sulfa Antibiotics     Other reaction(s): Unknown    Current Outpatient Medications  Medication Sig Dispense Refill  . albuterol (PROVENTIL HFA;VENTOLIN HFA) 108 (90 Base) MCG/ACT inhaler Inhale 2 puffs into the lungs every 6 (six) hours as needed for wheezing or shortness of breath.    Marland Kitchen amLODipine (NORVASC) 10 MG tablet Take 10 mg by mouth daily.    . cloNIDine (CATAPRES - DOSED IN MG/24 HR) 0.2 mg/24hr patch Place 1 patch (0.2 mg total) onto the skin once a week. 4 patch 12  . clopidogrel (PLAVIX) 75 MG tablet Take 1 tablet (75 mg total) by mouth daily. 90 tablet 3  . docusate sodium (COLACE) 100 MG capsule Take 100 mg by mouth daily as needed for mild constipation. Takes daily    . doxazosin (CARDURA) 8 MG tablet TAKE ONE TABLET TWICE DAILY 180 tablet 3  . furosemide (LASIX) 40 MG tablet Take 1 tablet (40 mg total) by mouth daily. 30 tablet 2    . hydrALAZINE (APRESOLINE) 25 MG tablet Take 1 tablet (25 mg) by mouth two times a day. (Patient taking differently: Take 25 mg by mouth. For systolic BP 329) 518 tablet 2  . hydrALAZINE (APRESOLINE) 25 MG tablet Take 1 tablet (25 mg) by mouth twice daily as needed for systolic blood pressure if the top number is greater than  > 160 180 tablet 3  . ipratropium-albuterol (DUONEB) 0.5-2.5 (3) MG/3ML SOLN Take 3 mLs by nebulization every 4 (four) hours as needed (shortness of breath/wheezing.). 360 mL   . levothyroxine (SYNTHROID, LEVOTHROID) 125 MCG tablet Take 125 mcg by mouth daily.    Marland Kitchen lisinopril (PRINIVIL,ZESTRIL) 10 MG tablet Take 1 tablet (10 mg total) by mouth daily. 90 tablet 3  . metFORMIN (GLUCOPHAGE) 1000 MG tablet Take 1,000 mg by mouth 2 (two) times daily with a meal.    . Multiple Vitamin (MULTIVITAMIN) tablet Take 1 tablet by mouth daily.    Marland Kitchen oxybutynin (DITROPAN-XL) 5 MG 24  hr tablet Take 5 mg by mouth daily.     . pantoprazole (PROTONIX) 20 MG tablet Take 20 mg by mouth daily.    . simvastatin (ZOCOR) 40 MG tablet Take 1 tablet (40 mg total) by mouth at bedtime. 90 tablet 2  . SYMBICORT 160-4.5 MCG/ACT inhaler Inhale 2 puffs into the lungs 2 (two) times daily.      No current facility-administered medications for this visit.    Facility-Administered Medications Ordered in Other Visits  Medication Dose Route Frequency Provider Last Rate Last Dose  . 0.9 %  sodium chloride infusion   Intravenous Continuous Lloyd Huger, MD      . ferumoxytol [facility]) 510 mg in sodium chloride 0.9 % 100 mL IVPB  510 mg Intravenous Once Lloyd Huger, MD        OBJECTIVE: Vitals:   12/22/17 1040 12/22/17 1045  BP:  (!) 142/53  Pulse:  69  Resp: 16   Temp:  98.6 F (37 C)  SpO2:  98%     Body mass index is 32.79 kg/m.    ECOG FS:2 - Symptomatic, <50% confined to bed  General: Well-developed, well-nourished, no acute distress.  Sitting in a wheelchair. Eyes: Pink  conjunctiva, anicteric sclera. HEENT: Normocephalic, moist mucous membranes. Lungs: Clear to auscultation bilaterally. Heart: Regular rate and rhythm. No rubs, murmurs, or gallops. Abdomen: Soft, nontender, nondistended. No organomegaly noted, normoactive bowel sounds. Musculoskeletal: No edema, cyanosis, or clubbing. Neuro: Alert, answering all questions appropriately. Cranial nerves grossly intact. Skin: No rashes or petechiae noted. Psych: Normal affect.  LAB RESULTS:  Lab Results  Component Value Date   NA 140 05/20/2017   K 4.1 05/20/2017   CL 103 05/20/2017   CO2 26 05/20/2017   GLUCOSE 146 (H) 05/20/2017   BUN 18 05/20/2017   CREATININE 1.32 (H) 05/20/2017   CALCIUM 7.8 (L) 05/20/2017   PROT 5.9 (L) 05/15/2017   ALBUMIN 2.9 (L) 05/15/2017   AST 26 05/15/2017   ALT 11 (L) 05/15/2017   ALKPHOS 70 05/15/2017   BILITOT 0.5 05/15/2017   GFRNONAA 35 (L) 05/20/2017   GFRAA 40 (L) 05/20/2017    Lab Results  Component Value Date   WBC 5.1 12/22/2017   NEUTROABS 2.8 12/22/2017   HGB 10.4 (L) 12/22/2017   HCT 31.3 (L) 12/22/2017   MCV 93.4 12/22/2017   PLT 165 12/22/2017   Lab Results  Component Value Date   IRON 70 12/22/2017   TIBC 234 (L) 12/22/2017   IRONPCTSAT 30 12/22/2017    Lab Results  Component Value Date   FERRITIN 82 12/22/2017    STUDIES: No results found.  ASSESSMENT:  Iron deficiency anemia secondary to chronic blood loss, shortness of breath.   PLAN:    1. Iron deficiency anemia secondary to chronic blood loss: Previously, patient was reported to be intolerant of oral iron supplementation.  Patient had colonoscopy and EGD in August 2018.  Although patient's iron stores are now within normal limits, her hemoglobin has trended down therefore will proceed with one infusion of 510 mg IV Feraheme.  She does not require second infusion.  Return to clinic in 2 months with repeat laboratory work and further evaluation.   2. Pulmonary nodules: No  intervention needed.  Repeat CT scan from January 13, 2017 revealed stable pulmonary nodules.  No further imaging is necessary.   3. Renal insufficiency: Mild, monitor. 4.  Hypertension: Blood pressure still mildly elevated, but better controlled.  Continue monitoring and treatment per  primary care.   5.  History of GI bleed: Colonoscopy and EGD in August 2018.  Continue follow-up with GI as indicated. 6.  Hoarseness of voice: Appreciate ENT input.  Patient reports that she is now seeing speech pathology.   Patient expressed understanding and was in agreement with this plan. She also understands that She can call clinic at any time with any questions, concerns, or complaints.    Lloyd Huger, MD   12/24/2017 8:44 AM

## 2017-12-21 ENCOUNTER — Ambulatory Visit: Payer: Medicare Other | Admitting: Speech Pathology

## 2017-12-21 ENCOUNTER — Other Ambulatory Visit: Payer: Self-pay | Admitting: *Deleted

## 2017-12-21 DIAGNOSIS — N183 Chronic kidney disease, stage 3 (moderate): Secondary | ICD-10-CM | POA: Diagnosis not present

## 2017-12-21 DIAGNOSIS — E1165 Type 2 diabetes mellitus with hyperglycemia: Secondary | ICD-10-CM | POA: Diagnosis not present

## 2017-12-21 DIAGNOSIS — D500 Iron deficiency anemia secondary to blood loss (chronic): Secondary | ICD-10-CM | POA: Diagnosis not present

## 2017-12-21 DIAGNOSIS — I2510 Atherosclerotic heart disease of native coronary artery without angina pectoris: Secondary | ICD-10-CM | POA: Diagnosis not present

## 2017-12-21 DIAGNOSIS — E538 Deficiency of other specified B group vitamins: Secondary | ICD-10-CM | POA: Diagnosis not present

## 2017-12-21 DIAGNOSIS — D649 Anemia, unspecified: Secondary | ICD-10-CM

## 2017-12-21 DIAGNOSIS — J449 Chronic obstructive pulmonary disease, unspecified: Secondary | ICD-10-CM | POA: Diagnosis not present

## 2017-12-21 DIAGNOSIS — E079 Disorder of thyroid, unspecified: Secondary | ICD-10-CM | POA: Diagnosis not present

## 2017-12-21 DIAGNOSIS — I10 Essential (primary) hypertension: Secondary | ICD-10-CM | POA: Diagnosis not present

## 2017-12-22 ENCOUNTER — Inpatient Hospital Stay (HOSPITAL_BASED_OUTPATIENT_CLINIC_OR_DEPARTMENT_OTHER): Payer: Medicare Other | Admitting: Oncology

## 2017-12-22 ENCOUNTER — Other Ambulatory Visit: Payer: Self-pay

## 2017-12-22 ENCOUNTER — Encounter: Payer: Self-pay | Admitting: Oncology

## 2017-12-22 ENCOUNTER — Inpatient Hospital Stay: Payer: Medicare Other | Attending: Oncology

## 2017-12-22 ENCOUNTER — Inpatient Hospital Stay: Payer: Medicare Other

## 2017-12-22 VITALS — BP 142/53 | HR 69 | Temp 98.6°F | Resp 16 | Ht 62.0 in | Wt 179.3 lb

## 2017-12-22 DIAGNOSIS — D500 Iron deficiency anemia secondary to blood loss (chronic): Secondary | ICD-10-CM

## 2017-12-22 DIAGNOSIS — K922 Gastrointestinal hemorrhage, unspecified: Secondary | ICD-10-CM

## 2017-12-22 DIAGNOSIS — R911 Solitary pulmonary nodule: Secondary | ICD-10-CM | POA: Insufficient documentation

## 2017-12-22 DIAGNOSIS — R531 Weakness: Secondary | ICD-10-CM | POA: Diagnosis not present

## 2017-12-22 DIAGNOSIS — R0602 Shortness of breath: Secondary | ICD-10-CM | POA: Diagnosis not present

## 2017-12-22 DIAGNOSIS — R490 Dysphonia: Secondary | ICD-10-CM

## 2017-12-22 DIAGNOSIS — I10 Essential (primary) hypertension: Secondary | ICD-10-CM | POA: Insufficient documentation

## 2017-12-22 DIAGNOSIS — D649 Anemia, unspecified: Secondary | ICD-10-CM

## 2017-12-22 DIAGNOSIS — R5382 Chronic fatigue, unspecified: Secondary | ICD-10-CM | POA: Diagnosis not present

## 2017-12-22 DIAGNOSIS — N289 Disorder of kidney and ureter, unspecified: Secondary | ICD-10-CM

## 2017-12-22 LAB — IRON AND TIBC
IRON: 70 ug/dL (ref 28–170)
Saturation Ratios: 30 % (ref 10.4–31.8)
TIBC: 234 ug/dL — ABNORMAL LOW (ref 250–450)
UIBC: 164 ug/dL

## 2017-12-22 LAB — CBC WITH DIFFERENTIAL/PLATELET
ABS IMMATURE GRANULOCYTES: 0.02 10*3/uL (ref 0.00–0.07)
Basophils Absolute: 0 10*3/uL (ref 0.0–0.1)
Basophils Relative: 0 %
EOS ABS: 0.1 10*3/uL (ref 0.0–0.5)
Eosinophils Relative: 2 %
HEMATOCRIT: 31.3 % — AB (ref 36.0–46.0)
HEMOGLOBIN: 10.4 g/dL — AB (ref 12.0–15.0)
Immature Granulocytes: 0 %
LYMPHS ABS: 1.5 10*3/uL (ref 0.7–4.0)
LYMPHS PCT: 30 %
MCH: 31 pg (ref 26.0–34.0)
MCHC: 33.2 g/dL (ref 30.0–36.0)
MCV: 93.4 fL (ref 80.0–100.0)
MONOS PCT: 11 %
Monocytes Absolute: 0.6 10*3/uL (ref 0.1–1.0)
NEUTROS PCT: 57 %
Neutro Abs: 2.8 10*3/uL (ref 1.7–7.7)
Platelets: 165 10*3/uL (ref 150–400)
RBC: 3.35 MIL/uL — ABNORMAL LOW (ref 3.87–5.11)
RDW: 12 % (ref 11.5–15.5)
WBC: 5.1 10*3/uL (ref 4.0–10.5)
nRBC: 0 % (ref 0.0–0.2)

## 2017-12-22 LAB — SAMPLE TO BLOOD BANK

## 2017-12-22 LAB — FERRITIN: Ferritin: 82 ng/mL (ref 11–307)

## 2017-12-22 MED ORDER — SODIUM CHLORIDE 0.9 % IV SOLN
510.0000 mg | Freq: Once | INTRAVENOUS | Status: AC
  Administered 2017-12-22: 510 mg via INTRAVENOUS
  Filled 2017-12-22: qty 17

## 2017-12-22 MED ORDER — SODIUM CHLORIDE 0.9 % IV SOLN
INTRAVENOUS | Status: DC
  Administered 2017-12-22: 12:00:00 via INTRAVENOUS
  Filled 2017-12-22: qty 250

## 2017-12-22 NOTE — Progress Notes (Signed)
Patient has little energy. She is short of breath on excertion secondary to cardiac problems.

## 2017-12-24 ENCOUNTER — Ambulatory Visit: Payer: Medicare Other | Admitting: Speech Pathology

## 2017-12-24 ENCOUNTER — Encounter: Payer: Self-pay | Admitting: Speech Pathology

## 2017-12-24 DIAGNOSIS — R490 Dysphonia: Secondary | ICD-10-CM

## 2017-12-24 NOTE — Therapy (Signed)
[place] MAIN [facility] SERVICES [address] [place], [place], [address] Phone: [phone]   Fax:  [phone]  Speech Language Pathology Treatment  Patient Details  Name: Amy [name] MRN: [id number] Date of Birth: 12-Sep-1927 Referring Provider (SLP): Dr. Tami Ribas   Encounter Date: 12/24/2017  End of Session - 12/24/17 1328    Visit Number  5    Number of Visits  17    Date for SLP Re-Evaluation  01/31/18    SLP Start Time  1200    SLP Stop Time   1250    SLP Time Calculation (min)  50 min    Activity Tolerance  Patient tolerated treatment well       Past Medical History:  Diagnosis Date  . Bladder incontinence   . CAD (coronary artery disease)    a. 05/2009 Cath: LAD 90p (Xience 2.75 x 12 mm DES), 15m, D1 40, LCx 40p/m, RCA 30/40/30p/m, RCA 30/25d;  b.  12/2011 Lexiscan MV: no ischemia, breast attenuation artifact, normal EF-->Low risk; c. 10/2016 MV: fixed apical defect, most likely apical thinning and attenuation, No ischemia, EF 60%.  . Cancer ([place])    ovarian  . Carotid arterial disease w/ R Carotid Bruit (HCC)    a. 08/2016 Carotid U/S: 40-59% bilat ICA stenosis - f/u 1 yr.  . Chronic diastolic (congestive) heart failure ([place])    a. Echo 09/2015: EF 55-60% w/ Grade 1 DD, sev Ca2+ MV annulus, mildly dil LA; b. 09/2016 Echo: EF 65-70%, Gr2 DD, mildly dil LA/RA, nl RV fxn.  . Chronic Dyspnea on exertion   . CKD (chronic kidney disease) stage 3, GFR 30-59 ml/min (HCC)   . Degenerative arthritis of knee    bilateral knees  . Diabetes mellitus    Type II  . GIB (gastrointestinal bleeding)    a. 08/2016 Admission w/ presyncope/anemia/melena-->req Transfusion-->endo ok, colonoscopy w/ polyps but no source of bleeding (most likely diverticular).  . Hiatal hernia   . Hypertension   . Iron deficiency   . Menopausal symptoms   . Morbid obesity ([place])   . Renal insufficiency   . Thyroid disease    hypothyroidism    Past Surgical  History:  Procedure Laterality Date  . COLONOSCOPY  2015  . COLONOSCOPY WITH PROPOFOL N/A 09/25/2016   Procedure: COLONOSCOPY WITH PROPOFOL;  Surgeon: [name], MD;  Location: [facility] ENDOSCOPY;  Service: Gastroenterology;  Laterality: N/A;  . COLONOSCOPY WITH PROPOFOL N/A 09/26/2016   Procedure: COLONOSCOPY WITH PROPOFOL;  Surgeon: [name], MD;  Location: [facility] ENDOSCOPY;  Service: Gastroenterology;  Laterality: N/A;  . ESOPHAGOGASTRODUODENOSCOPY (EGD) WITH PROPOFOL N/A 09/25/2016   Procedure: ESOPHAGOGASTRODUODENOSCOPY (EGD) WITH PROPOFOL;  Surgeon: [name], MD;  Location[facility] ENDOSCOPY;  Service: Gastroenterology;  Laterality: N/A;  . REPLACEMENT TOTAL KNEE BILATERAL    . TOTAL VAGINAL HYSTERECTOMY     ovarian mass, not cancerous  . UPPER GI ENDOSCOPY  2015    There were no vitals filed for this visit.  Subjective Assessment - 12/24/17 1327    Subjective  "I lose my voice at night"            ADULT SLP TREATMENT - 12/24/17 0001      General Information   Behavior/Cognition  Alert;Cooperative;Pleasant mood    HPI   84 year old woman, with several months' history of hoarseness, referred by Dr. Tami Ribas for voice therapy.  The patient reports that Dr. Tami Ribas told her that "my vocal cords are spreading".  Treatment Provided   Treatment provided  Cognitive-Linquistic      Pain Assessment   Pain Assessment  No/denies pain      Cognitive-Linquistic Treatment   Treatment focused on  Voice    Skilled Treatment  The patient was provided with written and verbal teaching regarding neck, shoulder, tongue, and throat stretches exercises to promote relaxed phonation. The patient was provided with written and verbal teaching for supplemental vocal tract relaxation exercises (straw phonation) - the patient is not able to generate an un-tensed phonation. The patient was provided with written and verbal teaching regarding breath support exercises.  The patient has very little  respiratory support- we have added several blowing exercises as well as requesting that she find and use her incentive spirometer.  Multiple techniques were introduced to promote relaxed phonation.  The patient was most successful with breath support/vocal loudness strategy.  Patient able to generate clear voice while reading and generating short sentences using INHALE-speak loudly-EXHALE strategy.  She appears to have been trying to conserve her breath via breath holding.        Assessment / Recommendations / Plan   Plan  Continue with current plan of care      Progression Toward Goals   Progression toward goals  Progressing toward goals       SLP Education - 12/24/17 1327    Education Details  Don't hold you breath while talking    Person(s) Educated  Patient    Methods  Explanation    Comprehension  Verbalized understanding;Need further instruction         SLP Long Term Goals - 12/01/17 1201      SLP LONG TERM GOAL #1   Title  The patient will demonstrate independent understanding of vocal hygiene concepts and extrinsic laryngeal muscle stretches.      Time  4    Period  Weeks    Status  New    Target Date  01/01/18      SLP LONG TERM GOAL #2   Title  The patient will be independent for abdominal breathing and breath support exercises.    Time  4    Period  Weeks    Status  New    Target Date  01/01/18      SLP LONG TERM GOAL #3   Title  The patient will minimize vocal tension via resonant voice therapy (or comparable technique) with min SLP cues with 80% accuracy.    Time  4    Period  Weeks    Status  New    Target Date  01/01/18      SLP LONG TERM GOAL #4   Title  The patient will maximize voice quality and loudness using breath support/oral resonance for paragraph length recitation with 80% accuracy.    Time  8    Period  Weeks    Status  New    Target Date  01/31/18       Plan - 12/24/17 1328    Clinical Impression Statement  Patient able to improve vocal  quality with INHALE-speak loudly-EXHALE strategy with greater consistency.   The patient has poor respiratory support and has developed maladaptive behaviors to compensate (strained phonation).  The patient is demonstrating emerging generalization of relaxed phonation into her speech.    Speech Therapy Frequency  2x / week    Duration  Other (comment)    Treatment/Interventions  SLP instruction and feedback;Patient/family education;Other (comment)   Voice therapy  Potential to Achieve Goals  Good    Potential Considerations  Ability to learn/carryover information;Severity of impairments;Co-morbidities;Cooperation/participation level;Family/community support;Medical prognosis    SLP Home Exercise Plan  Provided    Consulted and Agree with Plan of Care  Patient       Patient will benefit from skilled therapeutic intervention in order to improve the following deficits and impairments:   Dysphonia    Problem List Patient Active Problem List   Diagnosis Date Noted  . Diabetes ([place]) 06/29/2017  . CAP (community acquired pneumonia) 05/16/2017  . GI bleed 09/21/2016  . Vitamin D deficiency 11/04/2015  . Multiple lung nodules   . Bronchitis with obstruction ([place])   . New onset atrial fibrillation ([place]) 10/04/2015  . Anemia 10/04/2015  . Advanced directives, counseling/discussion 06/05/2015  . Morbid obesity ([place]) 12/05/2014  . Angina pectoris associated with type 2 diabetes mellitus ([place]) 12/05/2014  . Chronic diastolic CHF (congestive heart failure) ([place]) 09/06/2013  . Preop cardiovascular exam 06/03/2012  . Iron deficiency anemia 09/08/2011  . Hyperlipidemia 07/18/2009  . CAD, NATIVE VESSEL 07/18/2009  . HYPOTHYROIDISM-IATROGENIC 07/12/2009  . Essential hypertension 07/12/2009   Leroy Sea, MS/CCC- SLP  Lou Miner 12/24/2017, 1:29 PM  [place] MAIN [facility] SERVICES [address] [place], [place], [address] Phone: [phone]    Fax:  [phone]   Name: YOLTZIN RANSOM MRN: [id number] Date of Birth: 02-18-1928

## 2017-12-25 ENCOUNTER — Ambulatory Visit: Payer: Medicare Other | Admitting: Speech Pathology

## 2017-12-25 DIAGNOSIS — E538 Deficiency of other specified B group vitamins: Secondary | ICD-10-CM | POA: Diagnosis not present

## 2017-12-28 ENCOUNTER — Ambulatory Visit: Payer: Medicare Other | Admitting: Gastroenterology

## 2017-12-30 ENCOUNTER — Ambulatory Visit: Payer: Medicare Other | Admitting: Gastroenterology

## 2017-12-30 ENCOUNTER — Encounter: Payer: Self-pay | Admitting: Speech Pathology

## 2017-12-30 ENCOUNTER — Ambulatory Visit: Payer: Medicare Other | Attending: Unknown Physician Specialty | Admitting: Speech Pathology

## 2017-12-30 DIAGNOSIS — R490 Dysphonia: Secondary | ICD-10-CM | POA: Diagnosis not present

## 2017-12-30 NOTE — Therapy (Signed)
[place] MAIN [facility] SERVICES [address] [place], [place], [address] Phone: [phone]   Fax:  [phone]  Speech Language Pathology Treatment/Progress Note  Patient Details  Name: Amy [name] MRN: [id number] Date of Birth: 1927-12-10 Referring Provider (SLP): Dr. Tami Ribas   Encounter Date: 12/30/2017  End of Session - 12/30/17 1202    Visit Number  6    Number of Visits  17    Date for SLP Re-Evaluation  01/31/18    SLP Start Time  1100    SLP Stop Time   1153    SLP Time Calculation (min)  53 min       Past Medical History:  Diagnosis Date  . Bladder incontinence   . CAD (coronary artery disease)    a. 05/2009 Cath: LAD 90p (Xience 2.75 x 12 mm DES), 9m D1 40, LCx 40p/m, RCA 30/40/30p/m, RCA 30/25d;  b.  12/2011 Lexiscan MV: no ischemia, breast attenuation artifact, normal EF-->Low risk; c. 10/2016 MV: fixed apical defect, most likely apical thinning and attenuation, No ischemia, EF 60%.  . Cancer ([place]    ovarian  . Carotid arterial disease w/ R Carotid Bruit (HCC)    a. 08/2016 Carotid U/S: 40-59% bilat ICA stenosis - f/u 1 yr.  . Chronic diastolic (congestive) heart failure ([place]    a. Echo 09/2015: EF 55-60% w/ Grade 1 DD, sev Ca2+ MV annulus, mildly dil LA; b. 09/2016 Echo: EF 65-70%, Gr2 DD, mildly dil LA/RA, nl RV fxn.  . Chronic Dyspnea on exertion   . CKD (chronic kidney disease) stage 3, GFR 30-59 ml/min (HCC)   . Degenerative arthritis of knee    bilateral knees  . Diabetes mellitus    Type II  . GIB (gastrointestinal bleeding)    a. 08/2016 Admission w/ presyncope/anemia/melena-->req Transfusion-->endo ok, colonoscopy w/ polyps but no source of bleeding (most likely diverticular).  . Hiatal hernia   . Hypertension   . Iron deficiency   . Menopausal symptoms   . Morbid obesity ([place]   . Renal insufficiency   . Thyroid disease    hypothyroidism    Past Surgical History:  Procedure Laterality Date  .  COLONOSCOPY  2015  . COLONOSCOPY WITH PROPOFOL N/A 09/25/2016   Procedure: COLONOSCOPY WITH PROPOFOL;  Surgeon: AJonathon Bellows MD;  [facility]  Service: Gastroenterology;  Laterality: N/A;  . COLONOSCOPY WITH PROPOFOL N/A 09/26/2016   Procedure: COLONOSCOPY WITH PROPOFOL;  Surgeon: AJonathon Bellows MD;  [facility];  Service: Gastroenterology;  Laterality: N/A;  . ESOPHAGOGASTRODUODENOSCOPY (EGD) WITH PROPOFOL N/A 09/25/2016   Procedure: ESOPHAGOGASTRODUODENOSCOPY (EGD) WITH PROPOFOL;  Surgeon: AJonathon Bellows MD;  [facility];  Service: Gastroenterology;  Laterality: N/A;  . REPLACEMENT TOTAL KNEE BILATERAL    . TOTAL VAGINAL HYSTERECTOMY     ovarian mass, not cancerous  . UPPER GI ENDOSCOPY  2015    There were no vitals filed for this visit.  Subjective Assessment - 12/30/17 1200    Subjective  "I lose my voice at night"            ADULT SLP TREATMENT - 12/30/17 0001      General Information   Behavior/Cognition  Alert;Cooperative;Pleasant mood    HPI   84year old woman, with several months' history of hoarseness, referred by Dr. MTami Ribasfor voice therapy.  The patient reports that Dr. MTami Ribastold her that "my vocal cords are spreading".       Treatment Provided  Treatment provided  Cognitive-Linquistic      Pain Assessment   Pain Assessment  No/denies pain      Cognitive-Linquistic Treatment   Treatment focused on  Voice    Skilled Treatment  The patient was provided with written and verbal teaching regarding neck, shoulder, tongue, and throat stretches exercises to promote relaxed phonation. The patient was provided with written and verbal teaching for supplemental vocal tract relaxation exercises (straw phonation) - the patient is not able to generate an un-tensed phonation. The patient was provided with written and verbal teaching regarding breath support exercises.  The patient has very little respiratory support- we have added several  blowing exercises as well as requesting that she find and use her incentive spirometer.  Multiple techniques were introduced to promote relaxed phonation.  The patient was most successful with breath support/vocal loudness strategy.  Patient able to generate clear voice for conversation for 50 minutes using INHALE-speak loudly-EXHALE strategy.  She appears to have been trying to conserve her breath via breath holding.        Assessment / Recommendations / Plan   Plan  Continue with current plan of care      Progression Toward Goals   Progression toward goals  Progressing toward goals       SLP Education - 12/30/17 1200    Education Details  Use the INHALE-speak loudly-EXHALE strategy at night    Person(s) Educated  Patient    Methods  Explanation    Comprehension  Verbalized understanding         SLP Long Term Goals - 12/30/17 1203      SLP LONG TERM GOAL #1   Title  The patient will demonstrate independent understanding of vocal hygiene concepts and extrinsic laryngeal muscle stretches.      Status  Achieved      SLP LONG TERM GOAL #2   Title  The patient will be independent for abdominal breathing and breath support exercises.    Status  Achieved      SLP LONG TERM GOAL #3   Title  The patient will minimize vocal tension via resonant voice therapy (or comparable technique) with min SLP cues with 80% accuracy.    Status  Achieved      SLP LONG TERM GOAL #4   Title  The patient will maximize voice quality and loudness using breath support/oral resonance for paragraph length recitation with 80% accuracy.    Status  Partially Met    Target Date  01/31/18       Plan - 12/30/17 1204    Clinical Impression Statement  Patient able to improve vocal quality with INHALE-speak loudly-EXHALE strategy with greater consistency.   The patient has poor respiratory support and has developed maladaptive behaviors to compensate (strained phonation).  The patient is demonstrating emerging  generalization of relaxed phonation into her speech.    Speech Therapy Frequency  2x / week    Duration  Other (comment)    Treatment/Interventions  SLP instruction and feedback;Patient/family education;Other (comment)    Potential to Achieve Goals  Good    Potential Considerations  Ability to learn/carryover information;Severity of impairments;Co-morbidities;Cooperation/participation level;Family/community support;Medical prognosis    SLP Home Exercise Plan  Provided    Consulted and Agree with Plan of Care  Patient       Patient will benefit from skilled therapeutic intervention in order to improve the following deficits and impairments:   Dysphonia    Problem List Patient Active Problem List  Diagnosis Date Noted  . Diabetes ([place]) 06/29/2017  . CAP (community acquired pneumonia) 05/16/2017  . GI bleed 09/21/2016  . Vitamin D deficiency 11/04/2015  . Multiple lung nodules   . Bronchitis with obstruction ([place])   . New onset atrial fibrillation ([place]) 10/04/2015  . Anemia 10/04/2015  . Advanced directives, counseling/discussion 06/05/2015  . Morbid obesity ([place]) 12/05/2014  . Angina pectoris associated with type 2 diabetes mellitus ([place]) 12/05/2014  . Chronic diastolic CHF (congestive heart failure) ([place]) 09/06/2013  . Preop cardiovascular exam 06/03/2012  . Iron deficiency anemia 09/08/2011  . Hyperlipidemia 07/18/2009  . CAD, NATIVE VESSEL 07/18/2009  . HYPOTHYROIDISM-IATROGENIC 07/12/2009  . Essential hypertension 07/12/2009   Leroy Sea, MS/CCC- SLP  Lou Miner 12/30/2017, 12:05 PM  [place] MAIN [facility] SERVICES [address] [place], [place], [address] Phone: [phone]   Fax:  [phone]   Name: Amy [name] MRN: [id number] Date of Birth: [birth year]

## 2018-01-01 ENCOUNTER — Ambulatory Visit: Payer: Medicare Other | Admitting: Speech Pathology

## 2018-01-05 ENCOUNTER — Ambulatory Visit: Payer: Medicare Other | Admitting: Speech Pathology

## 2018-01-07 ENCOUNTER — Ambulatory Visit: Payer: Medicare Other | Admitting: Speech Pathology

## 2018-01-11 ENCOUNTER — Ambulatory Visit
Admission: RE | Admit: 2018-01-11 | Discharge: 2018-01-11 | Disposition: A | Discharge status: Home / Self Care | Payer: Medicare Other | Source: Ambulatory Visit | Attending: Internal Medicine | Admitting: Internal Medicine

## 2018-01-11 DIAGNOSIS — Z1231 Encounter for screening mammogram for malignant neoplasm of breast: Secondary | ICD-10-CM | POA: Diagnosis not present

## 2018-01-12 ENCOUNTER — Ambulatory Visit: Payer: Medicare Other | Admitting: Speech Pathology

## 2018-01-13 DIAGNOSIS — R0982 Postnasal drip: Secondary | ICD-10-CM | POA: Diagnosis not present

## 2018-01-13 DIAGNOSIS — R490 Dysphonia: Secondary | ICD-10-CM | POA: Diagnosis not present

## 2018-01-14 ENCOUNTER — Ambulatory Visit: Payer: Medicare Other | Admitting: Speech Pathology

## 2018-01-14 DIAGNOSIS — R490 Dysphonia: Secondary | ICD-10-CM | POA: Diagnosis not present

## 2018-01-15 ENCOUNTER — Encounter: Payer: Self-pay | Admitting: Speech Pathology

## 2018-01-15 NOTE — Therapy (Signed)
[place] MAIN [facility] SERVICES [address] [place], [place], [address] Phone: [phone]   Fax:  [phone]  Speech Language Pathology Treatment  Patient Details  Name: Amy [name] MRN: [id number] Date of Birth: 24-Feb-1928 Referring Provider (SLP): Dr. Tami Ribas   Encounter Date: 01/14/2018  End of Session - 01/15/18 0914    Visit Number  7    Number of Visits  17    Date for SLP Re-Evaluation  01/31/18    SLP Start Time  1100    SLP Stop Time   1154    SLP Time Calculation (min)  54 min    Activity Tolerance  Patient tolerated treatment well       Past Medical History:  Diagnosis Date  . Bladder incontinence   . CAD (coronary artery disease)    a. 05/2009 Cath: LAD 90p (Xience 2.75 x 12 mm DES), 62m D1 40, LCx 40p/m, RCA 30/40/30p/m, RCA 30/25d;  b.  12/2011 Lexiscan MV: no ischemia, breast attenuation artifact, normal EF-->Low risk; c. 10/2016 MV: fixed apical defect, most likely apical thinning and attenuation, No ischemia, EF 60%.  . Cancer ([place]    ovarian  . Carotid arterial disease w/ R Carotid Bruit (HCC)    a. 08/2016 Carotid U/S: 40-59% bilat ICA stenosis - f/u 1 yr.  . Chronic diastolic (congestive) heart failure ([place]    a. Echo 09/2015: EF 55-60% w/ Grade 1 DD, sev Ca2+ MV annulus, mildly dil LA; b. 09/2016 Echo: EF 65-70%, Gr2 DD, mildly dil LA/RA, nl RV fxn.  . Chronic Dyspnea on exertion   . CKD (chronic kidney disease) stage 3, GFR 30-59 ml/min (HCC)   . Degenerative arthritis of knee    bilateral knees  . Diabetes mellitus    Type II  . GIB (gastrointestinal bleeding)    a. 08/2016 Admission w/ presyncope/anemia/melena-->req Transfusion-->endo ok, colonoscopy w/ polyps but no source of bleeding (most likely diverticular).  . Hiatal hernia   . Hypertension   . Iron deficiency   . Menopausal symptoms   . Morbid obesity ([place]   . Renal insufficiency   . Thyroid disease    hypothyroidism    Past Surgical  History:  Procedure Laterality Date  . COLONOSCOPY  2015  . COLONOSCOPY WITH PROPOFOL N/A 09/25/2016   Procedure: COLONOSCOPY WITH PROPOFOL;  Surgeon: AJonathon Bellows MD;  [facility];  Service: Gastroenterology;  Laterality: N/A;  . COLONOSCOPY WITH PROPOFOL N/A 09/26/2016   Procedure: COLONOSCOPY WITH PROPOFOL;  Surgeon: AJonathon Bellows MD;  [facility];  Service: Gastroenterology;  Laterality: N/A;  . ESOPHAGOGASTRODUODENOSCOPY (EGD) WITH PROPOFOL N/A 09/25/2016   Procedure: ESOPHAGOGASTRODUODENOSCOPY (EGD) WITH PROPOFOL;  Surgeon: AJonathon Bellows MD;  [facility];  Service: Gastroenterology;  Laterality: N/A;  . REPLACEMENT TOTAL KNEE BILATERAL    . TOTAL VAGINAL HYSTERECTOMY     ovarian mass, not cancerous  . UPPER GI ENDOSCOPY  2015    There were no vitals filed for this visit.  Subjective Assessment - 01/15/18 0914    Subjective  "I lose my voice at night"            ADULT SLP TREATMENT - 01/15/18 0001      General Information   Behavior/Cognition  Alert;Cooperative;Pleasant mood    HPI   84year old woman, with several months' history of hoarseness, referred by Dr. MTami Ribasfor voice therapy.  The patient reports that Dr. MTami Ribastold her that "my vocal cords are spreading".  Treatment Provided   Treatment provided  Cognitive-Linquistic      Pain Assessment   Pain Assessment  No/denies pain      Cognitive-Linquistic Treatment   Treatment focused on  Voice    Skilled Treatment  The patient was provided with written and verbal teaching regarding neck, shoulder, tongue, and throat stretches exercises to promote relaxed phonation. The patient was provided with written and verbal teaching for supplemental vocal tract relaxation exercises (straw phonation) - the patient is not able to generate an un-tensed phonation. The patient was provided with written and verbal teaching regarding breath support exercises.  The patient has very little  respiratory support- we have added several blowing exercises as well as requesting that she find and use her incentive spirometer.  Multiple techniques were introduced to promote relaxed phonation.  The patient was most successful with breath support/vocal loudness strategy.  Patient able to generate clear voice for conversation for 50 minutes using INHALE-speak loudly-EXHALE strategy.  She requires multiple cues to implement the strategy.      Assessment / Recommendations / Plan   Plan  Continue with current plan of care      Progression Toward Goals   Progression toward goals  Progressing toward goals       SLP Education - 01/15/18 0914    Education Details  Use the INHALE-speak loudly-EXHALE strategy at night    Person(s) Educated  Patient    Methods  Explanation    Comprehension  Verbalized understanding         SLP Long Term Goals - 12/30/17 1203      SLP LONG TERM GOAL #1   Title  The patient will demonstrate independent understanding of vocal hygiene concepts and extrinsic laryngeal muscle stretches.      Status  Achieved      SLP LONG TERM GOAL #2   Title  The patient will be independent for abdominal breathing and breath support exercises.    Status  Achieved      SLP LONG TERM GOAL #3   Title  The patient will minimize vocal tension via resonant voice therapy (or comparable technique) with min SLP cues with 80% accuracy.    Status  Achieved      SLP LONG TERM GOAL #4   Title  The patient will maximize voice quality and loudness using breath support/oral resonance for paragraph length recitation with 80% accuracy.    Status  Partially Met    Target Date  01/31/18       Plan - 01/15/18 0915    Clinical Impression Statement  Patient able to improve vocal quality with INHALE-speak loudly-EXHALE strategy with greater consistency.   The patient has poor respiratory support and has developed maladaptive behaviors to compensate (strained phonation).  The patient is  demonstrating emerging generalization of relaxed phonation into her speech.    Speech Therapy Frequency  2x / week    Duration  Other (comment)    Treatment/Interventions  SLP instruction and feedback;Patient/family education;Other (comment)   Voice therapy   Potential to Achieve Goals  Good    Potential Considerations  Ability to learn/carryover information;Severity of impairments;Co-morbidities;Cooperation/participation level;Family/community support;Medical prognosis    SLP Home Exercise Plan  Provided    Consulted and Agree with Plan of Care  Patient       Patient will benefit from skilled therapeutic intervention in order to improve the following deficits and impairments:   Dysphonia    Problem List Patient Active Problem List  Diagnosis Date Noted  . Diabetes ([place]) 06/29/2017  . CAP (community acquired pneumonia) 05/16/2017  . GI bleed 09/21/2016  . Vitamin D deficiency 11/04/2015  . Multiple lung nodules   . Bronchitis with obstruction ([place])   . New onset atrial fibrillation ([place]) 10/04/2015  . Anemia 10/04/2015  . Advanced directives, counseling/discussion 06/05/2015  . Morbid obesity ([place]) 12/05/2014  . Angina pectoris associated with type 2 diabetes mellitus ([place]) 12/05/2014  . Chronic diastolic CHF (congestive heart failure) ([place]) 09/06/2013  . Preop cardiovascular exam 06/03/2012  . Iron deficiency anemia 09/08/2011  . Hyperlipidemia 07/18/2009  . CAD, NATIVE VESSEL 07/18/2009  . HYPOTHYROIDISM-IATROGENIC 07/12/2009  . Essential hypertension 07/12/2009   Leroy Sea, MS/CCC- SLP  Lou Miner 01/15/2018, 9:15 AM  [place] MAIN [facility] SERVICES [address] [place], [place], [address] Phone: [phone]   Fax:  [phone]   Name: EVANGELENE VORA MRN: [id number] Date of Birth: 07/15/1927

## 2018-01-18 ENCOUNTER — Ambulatory Visit: Payer: Medicare Other | Admitting: Speech Pathology

## 2018-01-18 DIAGNOSIS — H353211 Exudative age-related macular degeneration, right eye, with active choroidal neovascularization: Secondary | ICD-10-CM | POA: Diagnosis not present

## 2018-01-25 DIAGNOSIS — E538 Deficiency of other specified B group vitamins: Secondary | ICD-10-CM | POA: Diagnosis not present

## 2018-01-26 NOTE — Progress Notes (Signed)
* [facility]     Assessment and Plan:  Dyspnea. -The patient's dyspnea is likely multifactorial from cardiomegaly with chronic diastolic congestive heart failure, bronchitis, atelectasis, deconditioning. --Again discussed today that her breathing issues are chronic, there is no particular "fix" for this. Our focus will be to try to keep her function where it is at, and try to allow her to live independently for as long as possible.  - We will check a VQ scan to look for evidence of chronic thromboembolic disease, she has chronic renal failure, therefore would avoid CT with contrast. - Recommended that she take her Lasix daily   Atelectasis/lung nodule. -She was encouraged to use her incentive spirometry and flutter valve. Continue PT.  -Given the presence of lung nodules, atelectasis, paratracheal lymphadenopathy,  given advanced age, multiple comorbidites would not recommend further workup.   Asthma. -Continue symbicort bid.  --Prevnar-13 11/12/2015; flu up-to-date.  Hiatal hernia/intrathoracic stomach. -Patient has a significant hiatal hernia, which is likely contributing to dyspnea and atelectasis. -The patient is likely at high risk of aspiration, continue to treat with antireflux measures. -Continue Protonix.   CHF with cardiomegaly. -Patient has significant cardiomegaly noted on CT of the chest, this likely contributing to atelectasis.   Advanced Care Planning 11/26/16:  Pt reaffirms that she would not want to undergo chemotherapy. She thinks that if something happened to her she would not want to be put on life support.    Orders Placed This Encounter  Procedures  . NM Pulmonary Perf and Vent   Return in about 6 months (around 07/29/2018).    Date: 01/26/2018  MRN# [id number] Amy [name] 05/29/1927   Amy [name] is a 84 y.o. old female seen in follow up for chief complaint of  Chief Complaint  Patient presents with  .  Follow-up    breathing just not getting any better  . Shortness of Breath    with any exertion, walking     HPI:   Patient is a 84 year old female, with multifactorial dyspnea including chronic bronchitis, congestive heart failure, chronic debility, hiatal hernia,  Atelectasis. She notes that she continues to be short winded with minimal to no activity.   She notes that her breathing continues to be short with with minimal activity. She takes symbicort 2 puffs twice per day. She is taking ventolin as needed, about twice per week. When she does take it for dyspnea on exertion, it does not really help. She is not sure if the symbicort is doing anything either. She has completed pulm rehab which she feels helped a bit. She takes lasix about 5 days per week (does not take it on rehab days), she notes she does not take it, her breathing worse.   She lives alone. She does most of her ADL's. She does drive but she but only does thinks that have valet parking because she can not walk a grocery store, so her family does that for her.    **Imaging personally reviewed, CT chest 01/13/2017; comparison with previous images including CT chest 10/09/15 continue to be bilateral lung nodules, and pleural thickening, however findings are otherwise unchanged. **Echo 10/22/16>> EF 65% **desat walk 11/12/15: Baseline oxygen sat today on RA at rest 96% and HR 98. After walking 20 feet pt sat was 97% and HR 108, significant dyspnea, unsteady gait, needed assistance when walking. Took a few minutes for breathing to recover.   Medication:   Outpatient Encounter Medications as of 01/27/2018  Medication  Sig  . albuterol (PROVENTIL HFA;VENTOLIN HFA) 108 (90 Base) MCG/ACT inhaler Inhale 2 puffs into the lungs every 6 (six) hours as needed for wheezing or shortness of breath.  Marland Kitchen amLODipine (NORVASC) 10 MG tablet Take 10 mg by mouth daily.  . cloNIDine (CATAPRES - DOSED IN MG/24 HR) 0.2 mg/24hr patch Place 1 patch (0.2 mg  total) onto the skin once a week.  . clopidogrel (PLAVIX) 75 MG tablet Take 1 tablet (75 mg total) by mouth daily.  Marland Kitchen docusate sodium (COLACE) 100 MG capsule Take 100 mg by mouth daily as needed for mild constipation. Takes daily  . doxazosin (CARDURA) 8 MG tablet TAKE ONE TABLET TWICE DAILY  . furosemide (LASIX) 40 MG tablet Take 1 tablet (40 mg total) by mouth daily.  . hydrALAZINE (APRESOLINE) 25 MG tablet Take 1 tablet (25 mg) by mouth two times a day. (Patient taking differently: Take 25 mg by mouth. For systolic BP 235)  . hydrALAZINE (APRESOLINE) 25 MG tablet Take 1 tablet (25 mg) by mouth twice daily as needed for systolic blood pressure if the top number is greater than  > 160  . ipratropium-albuterol (DUONEB) 0.5-2.5 (3) MG/3ML SOLN Take 3 mLs by nebulization every 4 (four) hours as needed (shortness of breath/wheezing.).  Marland Kitchen levothyroxine (SYNTHROID, LEVOTHROID) 125 MCG tablet Take 125 mcg by mouth daily.  Marland Kitchen lisinopril (PRINIVIL,ZESTRIL) 10 MG tablet Take 1 tablet (10 mg total) by mouth daily.  . metFORMIN (GLUCOPHAGE) 1000 MG tablet Take 1,000 mg by mouth 2 (two) times daily with a meal.  . Multiple Vitamin (MULTIVITAMIN) tablet Take 1 tablet by mouth daily.  Marland Kitchen oxybutynin (DITROPAN-XL) 5 MG 24 hr tablet Take 5 mg by mouth daily.   . pantoprazole (PROTONIX) 20 MG tablet Take 20 mg by mouth daily.  . simvastatin (ZOCOR) 40 MG tablet Take 1 tablet (40 mg total) by mouth at bedtime.  . SYMBICORT 160-4.5 MCG/ACT inhaler Inhale 2 puffs into the lungs 2 (two) times daily.    Facility-Administered Encounter Medications as of 01/27/2018  Medication  . 0.9 %  sodium chloride infusion  . ferumoxytol (FERAHEME) 510 mg in sodium chloride 0.9 % 100 mL IVPB     Allergies:  Losartan; Morphine and related; Atenolol; Codeine; Nsaids; Rofecoxib; Sucralfate; and Sulfa antibiotics  Review of Systems:  Constitutional: Feels well. Cardiovascular: Denies chest pain, exertional chest pain.  Pulmonary:  Denies hemoptysis, pleuritic chest pain.   The remainder of systems were reviewed and were found to be negative other than what is documented in the HPI.    Physical Examination:   VS: BP 130/72 (BP Location: Left Arm, Cuff Size: Normal)   Pulse 78   Ht 5\' 2"  (1.575 m)   Wt 160 lb 9.6 oz (72.8 kg)   SpO2 98%   BMI 29.37 kg/m   General Appearance: No distress  Neuro:without focal findings, mental status, speech normal, alert and oriented HEENT: PERRLA, EOM intact Pulmonary: No wheezing, No rales  CardiovascularNormal S1,S2.  No m/r/g.  Abdomen: Benign, Soft, non-tender, No masses Renal:  No costovertebral tenderness  GU:  No performed at this time. Endoc: No evident thyromegaly, no signs of acromegaly or Cushing features Skin:   warm, no rashes, no ecchymosis  Extremities: normal, no cyanosis, clubbing.     LABORATORY PANEL:   CBC No results for input(s): WBC, HGB, HCT, PLT in the last 168 hours. ------------------------------------------------------------------------------------------------------------------  Chemistries  No results for input(s): NA, K, CL, CO2, GLUCOSE, BUN, CREATININE, CALCIUM, MG, AST, ALT,  ALKPHOS, BILITOT in the last 168 hours.  Invalid input(s): GFRCGP ------------------------------------------------------------------------------------------------------------------  Cardiac Enzymes No results for input(s): TROPONINI in the last 168 hours. ------------------------------------------------------------  RADIOLOGY:   No results found for this or any previous visit. Results for orders placed during the hospital encounter of 12/28/14  DG Chest 2 View   Narrative CLINICAL DATA:  Hemoptysis this morning ; shortness of breath on exertion, history of asthma, chronic CHF, obesity, and diabetes.  EXAM: CHEST  2 VIEW  COMPARISON:  Portable chest x-ray of October 11, 2013  FINDINGS: The lungs are well-expanded. There is no focal infiltrate.  The interstitial markings are coarse. The heart is top-normal in size. The pulmonary vascularity is not engorged. There is no pleural effusion. There is a moderate-sized hiatal hernia. There is apical pleural thickening bilaterally. There is mild degenerative disc space narrowing of the mid thoracic spine.  IMPRESSION: Acute on chronic bronchitic change.  There is no evidence of CHF.   Electronically Signed   By: David  Martinique M.D.   On: 12/28/2014 13:21    ------------------------------------------------------------------------------------------------------------------  Thank  you for allowing [facility] to assist in the care of your patient. Our recommendations are noted above.  Please contact us if we can be of further service.  Marda Stalker, M.D., F.C.C.P.  Board Certified in Internal Medicine, Pulmonary Medicine, [place], and Sleep Medicine.  [facility] Office Number: [phone]  01/26/2018

## 2018-01-27 ENCOUNTER — Encounter: Payer: Self-pay | Admitting: Internal Medicine

## 2018-01-27 ENCOUNTER — Ambulatory Visit (INDEPENDENT_AMBULATORY_CARE_PROVIDER_SITE_OTHER): Payer: Medicare Other | Admitting: Internal Medicine

## 2018-01-27 ENCOUNTER — Ambulatory Visit: Payer: Medicare Other | Admitting: Internal Medicine

## 2018-01-27 VITALS — BP 130/72 | HR 78 | Ht 62.0 in | Wt 160.6 lb

## 2018-01-27 DIAGNOSIS — J9811 Atelectasis: Secondary | ICD-10-CM | POA: Diagnosis not present

## 2018-01-27 DIAGNOSIS — I2510 Atherosclerotic heart disease of native coronary artery without angina pectoris: Secondary | ICD-10-CM

## 2018-01-27 DIAGNOSIS — J9610 Chronic respiratory failure, unspecified whether with hypoxia or hypercapnia: Secondary | ICD-10-CM | POA: Diagnosis not present

## 2018-01-27 DIAGNOSIS — R0609 Other forms of dyspnea: Secondary | ICD-10-CM

## 2018-01-27 NOTE — Addendum Note (Signed)
Addended by: Stephanie Coup on: 01/27/2018 10:53 AM   Modules accepted: Orders

## 2018-01-27 NOTE — Patient Instructions (Signed)
Will send for V/Q scan to look for blood clots in your lungs.

## 2018-01-28 ENCOUNTER — Encounter: Payer: Self-pay | Admitting: Speech Pathology

## 2018-01-28 ENCOUNTER — Ambulatory Visit: Payer: Medicare Other | Attending: Unknown Physician Specialty | Admitting: Speech Pathology

## 2018-01-28 DIAGNOSIS — R490 Dysphonia: Secondary | ICD-10-CM | POA: Diagnosis not present

## 2018-01-28 NOTE — Therapy (Signed)
[place] MAIN [facility] SERVICES [address] [place], [place], [address] Phone: [phone]   Fax:  [phone]  Speech Language Pathology Treatment/Discharge Summary  Patient Details  Name: Amy [name] MRN: [id number] Date of Birth: Mar 23, 1927 Referring Provider (SLP): Dr. Tami Ribas   Encounter Date: 01/28/2018  End of Session - 01/28/18 1259    Visit Number  8    Number of Visits  17    Date for SLP Re-Evaluation  01/31/18    SLP Start Time  1100    SLP Stop Time   1150    SLP Time Calculation (min)  50 min    Activity Tolerance  Patient tolerated treatment well       Past Medical History:  Diagnosis Date  . Bladder incontinence   . CAD (coronary artery disease)    a. 05/2009 Cath: LAD 90p (Xience 2.75 x 12 mm DES), 13m D1 40, LCx 40p/m, RCA 30/40/30p/m, RCA 30/25d;  b.  12/2011 Lexiscan MV: no ischemia, breast attenuation artifact, normal EF-->Low risk; c. 10/2016 MV: fixed apical defect, most likely apical thinning and attenuation, No ischemia, EF 60%.  . Cancer ([facility]    ovarian  . Carotid arterial disease w/ R Carotid Bruit (HCC)    a. 08/2016 Carotid U/S: 40-59% bilat ICA stenosis - f/u 1 yr.  . Chronic diastolic (congestive) heart failure ([place]    a. Echo 09/2015: EF 55-60% w/ Grade 1 DD, sev Ca2+ MV annulus, mildly dil LA; b. 09/2016 Echo: EF 65-70%, Gr2 DD, mildly dil LA/RA, nl RV fxn.  . Chronic Dyspnea on exertion   . CKD (chronic kidney disease) stage 3, GFR 30-59 ml/min (HCC)   . Degenerative arthritis of knee    bilateral knees  . Diabetes mellitus    Type II  . GIB (gastrointestinal bleeding)    a. 08/2016 Admission w/ presyncope/anemia/melena-->req Transfusion-->endo ok, colonoscopy w/ polyps but no source of bleeding (most likely diverticular).  . Hiatal hernia   . Hypertension   . Iron deficiency   . Menopausal symptoms   . [place]   . Renal insufficiency   . Thyroid disease    hypothyroidism     Past Surgical History:  Procedure Laterality Date  . COLONOSCOPY  2015  . COLONOSCOPY WITH PROPOFOL N/A 09/25/2016   Procedure: COLONOSCOPY WITH PROPOFOL;  Surgeon: AJonathon Bellows MD;  [facility]  Service: Gastroenterology;  Laterality: N/A;  . COLONOSCOPY WITH PROPOFOL N/A 09/26/2016   Procedure: COLONOSCOPY WITH PROPOFOL;  Surgeon: AJonathon Bellows MD;  [facility];  Service: Gastroenterology;  Laterality: N/A;  . ESOPHAGOGASTRODUODENOSCOPY (EGD) WITH PROPOFOL N/A 09/25/2016   Procedure: ESOPHAGOGASTRODUODENOSCOPY (EGD) WITH PROPOFOL;  Surgeon: AJonathon Bellows MD;  [facility];  Service: Gastroenterology;  Laterality: N/A;  . REPLACEMENT TOTAL KNEE BILATERAL    . TOTAL VAGINAL HYSTERECTOMY     ovarian mass, not cancerous  . UPPER GI ENDOSCOPY  2015    There were no vitals filed for this visit.  Subjective Assessment - 01/28/18 1258    Subjective  The patient reports that she was able to keep her voice for the Thanksgiving family gathering            ADULT SLP TREATMENT - 01/28/18 0001      General Information   Behavior/Cognition  Alert;Cooperative;Pleasant mood    HPI   84year old woman, with several months' history of hoarseness, referred by Dr. MTami Ribasfor voice therapy.  The patient reports that  Dr. Tami Ribas told her that "my vocal cords are spreading".       Treatment Provided   Treatment provided  Cognitive-Linquistic      Pain Assessment   Pain Assessment  No/denies pain      Cognitive-Linquistic Treatment   Treatment focused on  Voice    Skilled Treatment  The patient was provided with written and verbal teaching regarding neck, shoulder, tongue, and throat stretches exercises to promote relaxed phonation. The patient was provided with written and verbal teaching for supplemental vocal tract relaxation exercises (straw phonation) - the patient is not able to generate an un-tensed phonation. The patient was provided with written and  verbal teaching regarding breath support exercises.  The patient has very little respiratory support- we have added several blowing exercises as well as requesting that she find and use her incentive spirometer.  Multiple techniques were introduced to promote relaxed phonation.  The patient was most successful with breath support/vocal loudness strategy.  Patient able to generate clear voice for conversation for 50 minutes using INHALE-speak loudly-EXHALE strategy.  She requires occasional cues to implement the strategy.      Assessment / Recommendations / Plan   Plan  Discharge SLP treatment due to (comment);All goals met      Progression Toward Goals   Progression toward goals  Goals met, education completed, patient discharged from [facility] - 01/28/18 1259    Education Details  INHALE-speak loudly-EXHALE     Person(s) Educated  Patient    Methods  Explanation    Comprehension  Verbalized understanding         SLP Long Term Goals - 01/28/18 1302      SLP LONG TERM GOAL #4   Title  The patient will maximize voice quality and loudness using breath support/oral resonance for paragraph length recitation with 80% accuracy.    Status  Achieved       Plan - 01/28/18 1300    Clinical Impression Statement  Patient able to improve vocal quality with INHALE-speak loudly-EXHALE strategy with greater consistency.   The patient has poor respiratory support and has developed maladaptive behaviors to compensate (strained phonation).  The patient is demonstrating generalization of relaxed phonation into her speech when she is not fatigued.  The patient was given several signs (INHALE-speak loudly-EXHALE) to put up as reminders.    Speech Therapy Frequency  Other (comment)   Discharge   Treatment/Interventions  SLP instruction and feedback;Patient/family education;Other (comment)   Voice therapy   Potential to Achieve Goals  Good    Potential Considerations  Ability to  learn/carryover information;Severity of impairments;Co-morbidities;Cooperation/participation level;Family/community support;Medical prognosis    SLP Home Exercise Plan  Provided    Consulted and Agree with Plan of Care  Patient       Patient will benefit from skilled therapeutic intervention in order to improve the following deficits and impairments:   Dysphonia    Problem List Patient Active Problem List   Diagnosis Date Noted  . Diabetes ([place]) 06/29/2017  . CAP (community acquired pneumonia) 05/16/2017  . GI bleed 09/21/2016  . Vitamin D deficiency 11/04/2015  . Multiple lung nodules   . Bronchitis with obstruction ([place])   . New onset atrial fibrillation ([place]) 10/04/2015  . Anemia 10/04/2015  . Advanced directives, counseling/discussion 06/05/2015  . Morbid obesity ([place]) 12/05/2014  . Angina pectoris associated with type 2 diabetes mellitus ([place]) 12/05/2014  . Chronic diastolic CHF (congestive heart failure) ([place]) 09/06/2013  .  Preop cardiovascular exam 06/03/2012  . Iron deficiency anemia 09/08/2011  . Hyperlipidemia 07/18/2009  . CAD, NATIVE VESSEL 07/18/2009  . HYPOTHYROIDISM-IATROGENIC 07/12/2009  . Essential hypertension 07/12/2009   Leroy Sea, MS/CCC- SLP  Lou Miner 01/28/2018, 1:03 PM  [place] MAIN [facility] SERVICES [address] [place], [place], [address] Phone: [phone]   Fax:  [phone]   Name: Amy [name] MRN: [id number] Date of Birth: [birth year]

## 2018-01-29 ENCOUNTER — Ambulatory Visit
Admission: RE | Admit: 2018-01-29 | Discharge: 2018-01-29 | Disposition: A | Discharge status: Home / Self Care | Payer: Medicare Other | Source: Ambulatory Visit | Attending: Internal Medicine | Admitting: Internal Medicine

## 2018-01-29 DIAGNOSIS — R0602 Shortness of breath: Secondary | ICD-10-CM | POA: Diagnosis not present

## 2018-01-29 DIAGNOSIS — R0609 Other forms of dyspnea: Principal | ICD-10-CM

## 2018-01-29 MED ORDER — TECHNETIUM TO 99M ALBUMIN AGGREGATED
4.0000 | Freq: Once | INTRAVENOUS | Status: AC | PRN
  Administered 2018-01-29: 4.17 via INTRAVENOUS

## 2018-01-29 MED ORDER — TECHNETIUM TC 99M DIETHYLENETRIAME-PENTAACETIC ACID
33.5100 | Freq: Once | INTRAVENOUS | Status: AC | PRN
  Administered 2018-01-29: 33.51 via RESPIRATORY_TRACT

## 2018-02-01 ENCOUNTER — Encounter: Payer: Self-pay | Admitting: Podiatry

## 2018-02-01 ENCOUNTER — Ambulatory Visit (INDEPENDENT_AMBULATORY_CARE_PROVIDER_SITE_OTHER): Payer: Medicare Other | Admitting: Podiatry

## 2018-02-01 DIAGNOSIS — E119 Type 2 diabetes mellitus without complications: Secondary | ICD-10-CM

## 2018-02-01 DIAGNOSIS — B351 Tinea unguium: Secondary | ICD-10-CM

## 2018-02-01 DIAGNOSIS — D689 Coagulation defect, unspecified: Secondary | ICD-10-CM

## 2018-02-01 DIAGNOSIS — M79676 Pain in unspecified toe(s): Secondary | ICD-10-CM

## 2018-02-01 DIAGNOSIS — L608 Other nail disorders: Secondary | ICD-10-CM

## 2018-02-01 NOTE — Progress Notes (Signed)
Complaint:  Visit Type: Patient returns to my office for continued preventative foot care services. Complaint: Patient states" my nails have grown long and thick and become painful to walk and wear shoes" Patient has been diagnosed with DM with no foot complications. The patient presents for preventative foot care services. No changes to ROS.  Patient is taking plavix.  Podiatric Exam: Vascular: dorsalis pedis and posterior tibial pulses are palpable bilateral. Capillary return is immediate. Temperature gradient is WNL. Skin turgor WNL  Sensorium: Normal Semmes Weinstein monofilament test. Normal tactile sensation bilaterally. Nail Exam: Pt has thick disfigured discolored nails with subungual debris noted bilateral entire nail hallux through fifth toenails Ulcer Exam: There is no evidence of ulcer or pre-ulcerative changes or infection. Orthopedic Exam: Muscle tone and strength are WNL. No limitations in general ROM. No crepitus or effusions noted. Foot type and digits show no abnormalities. Bony prominences are unremarkable. Skin: No Porokeratosis. No infection or ulcers  Diagnosis:  Onychomycosis, , Pain in right toe, pain in left toes  Treatment & Plan Procedures and Treatment: Consent by patient was obtained for treatment procedures. The patient understood the discussion of treatment and procedures well. All questions were answered thoroughly reviewed. Debridement of mycotic and hypertrophic toenails, 1 through 5 bilateral and clearing of subungual debris. No ulceration, no infection noted. ABN signed for 2019. Return Visit-Office Procedure: Patient instructed to return to the office for a follow up visit 10 weeks  for continued evaluation and treatment.    Gardiner Barefoot DPM

## 2018-02-02 ENCOUNTER — Telehealth: Payer: Self-pay | Admitting: Internal Medicine

## 2018-02-02 NOTE — Telephone Encounter (Signed)
Please call patients regarding her results.

## 2018-02-03 ENCOUNTER — Encounter: Payer: Self-pay | Admitting: Internal Medicine

## 2018-02-03 ENCOUNTER — Other Ambulatory Visit: Payer: Medicare Other

## 2018-02-03 DIAGNOSIS — I5032 Chronic diastolic (congestive) heart failure: Secondary | ICD-10-CM

## 2018-02-03 NOTE — Telephone Encounter (Signed)
Nurse with pallative care is calling to follow up on testing results. States pt is SOB, weakness is worse today. No energy.  Contact number is patient's home number.

## 2018-02-03 NOTE — Telephone Encounter (Signed)
Her VQ scan was normal, no sign of blood clot.

## 2018-02-03 NOTE — Telephone Encounter (Signed)
This encounter was created in error - please disregard.

## 2018-02-03 NOTE — Telephone Encounter (Signed)
Called patient and let her know results of VQ scan per Dr. Ashby Dawes. She is aware no blood clot was found. Also spoke to patient about phone message from Palliative care, patient states she is always this way and the SOB and tiredness is not new for her. Nothing further needed at this time.

## 2018-02-04 NOTE — Progress Notes (Signed)
PATIENT NAME: Amy [name] DOB: [birth year] MRN: [id number]  PRIMARY CARE PROVIDER: [name], MD  RESPONSIBLE PARTY:  Acct ID - Guarantor Home Phone Work Phone Relationship Acct Type  [id number] [name],* [phone]  Self P/F     [address], [place], [place] [address]    PLAN OF CARE and INTERVENTIONS:               1.  GOALS OF CARE/ ADVANCE CARE PLANNING:  Patient would like to remain at home living independently.  Patient would like to have decreased shortness of breath.               2.  PATIENT/CAREGIVER EDUCATION:  Education to patient on need to take meds as directed to control shortness of breath.                  4. PERSONAL EMERGENCY PLAN:  In place               5.  DISEASE STATUS:Heart Failure    HISTORY OF PRESENT ILLNESS:    CODE STATUS:  Unknown  ADVANCED DIRECTIVES: Unknown MOST FORM: unknown PPS: 50%   PHYSICAL EXAM:   VITALS:There were no vitals filed for this visit.  LUNGS: decreased breath sounds CARDIAC: Cor RRR}  EXTREMITIES: Trace edema SKIN: Skin color, texture, turgor normal. No rashes or lesions or normal, mobility and turgor normal, nails clear without discoloration or sign of infection and temperature normal  NEURO: negative   Palliative Care visit made, patient is a 84 year old who lives independently in home behind her granddaughter.  Famiy checks on patient frequently.  Patient's daughter takes patient to MD appointments as patient will occasionally drive short distances.  Patient complaining of increased shortness of breath and weakness today.  Patient pulmonary VT test as MD was concerned about patient having "a blood clot."  Patient reports she called MD's office yesterday for results but did not receive a return call from MD's office.  RN calls [facility] and nurse to call patient back with results.  Patient receives iron every 2-3 months at the cancer center.  Patient to call cancer center to see if she can be seen  earlier as patient reports she feels weak and short of breath when she needs iron infusion.  Patient requests RN assist her with checkin gher blood sugar BS 99 at visit.  Patient remains able to warm food up in the microwave.  Patient remains able to fill her pill box but reports this is becoming moe difficult.  Palliative Care will continue to monitor.  Support provided to patient.  Patient encouraged to call Palliative Care Team with questions or concerns.            [name], RN

## 2018-02-08 ENCOUNTER — Ambulatory Visit: Payer: Medicare Other | Admitting: Podiatry

## 2018-02-08 DIAGNOSIS — H353222 Exudative age-related macular degeneration, left eye, with inactive choroidal neovascularization: Secondary | ICD-10-CM | POA: Diagnosis not present

## 2018-02-19 NOTE — Progress Notes (Signed)
[place]  [phone] [phone]  ID: Amy [name] OB: 17-Jun-1927  MR#: [id number]  XLK#:[id number]  Patient Care Team: [name], MD as PCP - General (Internal Medicine) Rockey Situ Kathlene November, MD as PCP - Cardiology (Cardiology) Minna Merritts, MD as Consulting Physician (Cardiology)  CHIEF COMPLAINT: Iron deficiency anemia secondary to chronic blood loss.  INTERVAL HISTORY: Patient returns to clinic today for repeat laboratory work, further evaluation, consideration of IV Feraheme.  She continues to have chronic weakness and fatigue. She otherwise feels well.  She has not noted any further GI bleeds or changes in her bowel movements. She has no neurologic complaints. She denies any recent fevers or illnesses. She denies any chest pain, cough, or hemoptysis.  She reports continued chronic shortness of breath.  She denies any nausea, vomiting, constipation, or diarrhea. She has no urinary complaints.  Patient offers no further specific complaints today.  REVIEW OF SYSTEMS:   Review of Systems  Constitutional: Positive for malaise/fatigue. Negative for fever and weight loss.  HENT: Negative.  Negative for sore throat.   Respiratory: Positive for shortness of breath. Negative for cough, hemoptysis and stridor.   Cardiovascular: Negative.  Negative for chest pain and leg swelling.  Gastrointestinal: Negative.  Negative for abdominal pain, blood in stool and melena.  Genitourinary: Negative.  Negative for hematuria.  Musculoskeletal: Negative.  Negative for back pain.  Skin: Negative.  Negative for rash.  Neurological: Positive for weakness. Negative for sensory change, focal weakness and headaches.  Psychiatric/Behavioral: Negative.  The patient is not nervous/anxious.     As per HPI. Otherwise, a complete review of systems is negative.  PAST MEDICAL HISTORY: Past Medical History:  Diagnosis Date  . Bladder incontinence   . CAD  (coronary artery disease)    a. 05/2009 Cath: LAD 90p (Xience 2.75 x 12 mm DES), 72m, D1 40, LCx 40p/m, RCA 30/40/30p/m, RCA 30/25d;  b.  12/2011 Lexiscan MV: no ischemia, breast attenuation artifact, normal EF-->Low risk; c. 10/2016 MV: fixed apical defect, most likely apical thinning and attenuation, No ischemia, EF 60%.  . Cancer ([place])    ovarian  . Carotid arterial disease w/ R Carotid Bruit (HCC)    a. 08/2016 Carotid U/S: 40-59% bilat ICA stenosis - f/u 1 yr.  . Chronic diastolic (congestive) heart failure ([place])    a. Echo 09/2015: EF 55-60% w/ Grade 1 DD, sev Ca2+ MV annulus, mildly dil LA; b. 09/2016 Echo: EF 65-70%, Gr2 DD, mildly dil LA/RA, nl RV fxn.  . Chronic Dyspnea on exertion   . CKD (chronic kidney disease) stage 3, GFR 30-59 ml/min (HCC)   . Degenerative arthritis of knee    bilateral knees  . Diabetes mellitus    Type II  . GIB (gastrointestinal bleeding)    a. 08/2016 Admission w/ presyncope/anemia/melena-->req Transfusion-->endo ok, colonoscopy w/ polyps but no source of bleeding (most likely diverticular).  . Hiatal hernia   . Hypertension   . Iron deficiency   . Menopausal symptoms   . Morbid obesity ([place])   . Renal insufficiency   . Thyroid disease    hypothyroidism    PAST SURGICAL HISTORY: Past Surgical History:  Procedure Laterality Date  . COLONOSCOPY  2015  . COLONOSCOPY WITH PROPOFOL N/A 09/25/2016   Procedure: COLONOSCOPY WITH PROPOFOL;  Surgeon: [name], MD;  [facility];  Service: Gastroenterology;  Laterality: N/A;  . COLONOSCOPY WITH PROPOFOL N/A 09/26/2016   Procedure: COLONOSCOPY WITH PROPOFOL;  Surgeon: [name],  MD;  Location: [facility] ENDOSCOPY;  Service: Gastroenterology;  Laterality: N/A;  . ESOPHAGOGASTRODUODENOSCOPY (EGD) WITH PROPOFOL N/A 09/25/2016   Procedure: ESOPHAGOGASTRODUODENOSCOPY (EGD) WITH PROPOFOL;  Surgeon: [name], MD;  Location[facility]COPY;  Service: Gastroenterology;  Laterality: N/A;  . REPLACEMENT TOTAL  KNEE BILATERAL    . TOTAL VAGINAL HYSTERECTOMY     ovarian mass, not cancerous  . UPPER GI ENDOSCOPY  2015    FAMILY HISTORY Family History  Problem Relation Age of Onset  . Breast cancer Mother 21       ADVANCED DIRECTIVES:    HEALTH MAINTENANCE: Social History   Tobacco Use  . Smoking status: Never Smoker  . Smokeless tobacco: Never Used  Substance Use Topics  . Alcohol use: No  . Drug use: No     Colonoscopy:  PAP:  Bone density:  Lipid panel:  Allergies  Allergen Reactions  . Losartan Other (See Comments)    Hyperkalemia  . Morphine And Related Shortness Of Breath    Rash, difficulty breathing, nausea.   . Atenolol Other (See Comments)    Other reaction(s): Other (See Comments) Decreased heart rate Decreased heart rate  . Codeine     Rash, difficulty breathing, nausea.  . Nsaids Other (See Comments)    Other reaction(s): Unknown  . Rofecoxib     Other reaction(s): Unknown  . Sucralfate Other (See Comments)    Throat tightness  . Sulfa Antibiotics     Other reaction(s): Unknown    Current Outpatient Medications  Medication Sig Dispense Refill  . albuterol (PROVENTIL HFA;VENTOLIN HFA) 108 (90 Base) MCG/ACT inhaler Inhale 2 puffs into the lungs every 6 (six) hours as needed for wheezing or shortness of breath.    Marland Kitchen amLODipine (NORVASC) 10 MG tablet Take 10 mg by mouth daily.    . cloNIDine (CATAPRES - DOSED IN MG/24 HR) 0.2 mg/24hr patch Place 1 patch (0.2 mg total) onto the skin once a week. 4 patch 12  . clopidogrel (PLAVIX) 75 MG tablet Take 1 tablet (75 mg total) by mouth daily. 90 tablet 3  . docusate sodium (COLACE) 100 MG capsule Take 100 mg by mouth daily as needed for mild constipation. Takes daily    . doxazosin (CARDURA) 8 MG tablet TAKE ONE TABLET TWICE DAILY 180 tablet 3  . furosemide (LASIX) 40 MG tablet Take 1 tablet (40 mg total) by mouth daily. 30 tablet 2  . hydrALAZINE (APRESOLINE) 25 MG tablet Take 1 tablet (25 mg) by mouth two  times a day. (Patient taking differently: Take 25 mg by mouth. For systolic BP 856) 314 tablet 2  . hydrALAZINE (APRESOLINE) 25 MG tablet Take 1 tablet (25 mg) by mouth twice daily as needed for systolic blood pressure if the top number is greater than  > 160 180 tablet 3  . ipratropium-albuterol (DUONEB) 0.5-2.5 (3) MG/3ML SOLN Take 3 mLs by nebulization every 4 (four) hours as needed (shortness of breath/wheezing.). 360 mL   . levothyroxine (SYNTHROID, LEVOTHROID) 125 MCG tablet Take 125 mcg by mouth daily.    . metFORMIN (GLUCOPHAGE) 1000 MG tablet Take 1,000 mg by mouth 2 (two) times daily with a meal.    . Multiple Vitamin (MULTIVITAMIN) tablet Take 1 tablet by mouth daily.    Marland Kitchen oxybutynin (DITROPAN-XL) 5 MG 24 hr tablet Take 5 mg by mouth daily.     . pantoprazole (PROTONIX) 20 MG tablet Take 20 mg by mouth daily.    . simvastatin (ZOCOR) 40 MG tablet Take 1  tablet (40 mg total) by mouth at bedtime. 90 tablet 2  . SYMBICORT 160-4.5 MCG/ACT inhaler Inhale 2 puffs into the lungs 2 (two) times daily.     Marland Kitchen lisinopril (PRINIVIL,ZESTRIL) 10 MG tablet Take 1 tablet (10 mg total) by mouth daily. 90 tablet 3   No current facility-administered medications for this visit.    Facility-Administered Medications Ordered in Other Visits  Medication Dose Route Frequency Provider Last Rate Last Dose  . 0.9 %  sodium chloride infusion   Intravenous Continuous Lloyd Huger, MD      . ferumoxytol [facility]) 510 mg in sodium chloride 0.9 % 100 mL IVPB  510 mg Intravenous Once Lloyd Huger, MD        OBJECTIVE: Vitals:   02/22/18 1038  BP: (!) 153/53  Pulse: 70  Resp: 18  Temp: (!) 97 F (36.1 C)  SpO2: 98%     Body mass index is 31.46 kg/m.    ECOG FS:2 - Symptomatic, <50% confined to bed  General: Well-developed, well-nourished, no acute distress.  Sitting in a wheelchair. Eyes: Pink conjunctiva, anicteric sclera. HEENT: Normocephalic, moist mucous membranes. Lungs: Clear to  auscultation bilaterally. Heart: Regular rate and rhythm. No rubs, murmurs, or gallops. Abdomen: Soft, nontender, nondistended. No organomegaly noted, normoactive bowel sounds. Musculoskeletal: No edema, cyanosis, or clubbing. Neuro: Alert, answering all questions appropriately. Cranial nerves grossly intact. Skin: No rashes or petechiae noted. Psych: Normal affect.  LAB RESULTS:  Lab Results  Component Value Date   NA 140 05/20/2017   K 4.1 05/20/2017   CL 103 05/20/2017   CO2 26 05/20/2017   GLUCOSE 146 (H) 05/20/2017   BUN 18 05/20/2017   CREATININE 1.32 (H) 05/20/2017   CALCIUM 7.8 (L) 05/20/2017   PROT 5.9 (L) 05/15/2017   ALBUMIN 2.9 (L) 05/15/2017   AST 26 05/15/2017   ALT 11 (L) 05/15/2017   ALKPHOS 70 05/15/2017   BILITOT 0.5 05/15/2017   GFRNONAA 35 (L) 05/20/2017   GFRAA 40 (L) 05/20/2017    Lab Results  Component Value Date   WBC 6.3 02/22/2018   NEUTROABS 3.8 02/22/2018   HGB 10.6 (L) 02/22/2018   HCT 32.6 (L) 02/22/2018   MCV 95.3 02/22/2018   PLT 198 02/22/2018   Lab Results  Component Value Date   IRON 67 02/22/2018   TIBC 227 (L) 02/22/2018   IRONPCTSAT 30 02/22/2018    Lab Results  Component Value Date   FERRITIN 248 02/22/2018    STUDIES: Dg Chest 2 View  Result Date: 01/29/2018 CLINICAL DATA:  Short of breath EXAM: CHEST - 2 VIEW COMPARISON:  05/18/2017 FINDINGS: Borderline cardiomegaly. Normal vascularity. Clear lungs. Moderate-sized hiatal hernia is noted with an air-fluid level. IMPRESSION: Cardiomegaly without decompensation. Electronically Signed   By: Marybelle Killings M.D.   On: 01/29/2018 15:28   Nm Pulmonary Perf And Vent  Result Date: 01/29/2018 CLINICAL DATA:  Dyspnea on exertion, chronic. EXAM: NUCLEAR MEDICINE VENTILATION - PERFUSION LUNG SCAN TECHNIQUE: Ventilation images were obtained in multiple projections using inhaled aerosol Tc-24m DTPA. Perfusion images were obtained in multiple projections after intravenous injection of  Tc-40m MAA. RADIOPHARMACEUTICALS:  33.51 mCi of Tc-30m DTPA aerosol inhalation and 4.17 mCi Tc32m MAA IV COMPARISON:  None. FINDINGS: Ventilation: No focal ventilation defect. Perfusion: No wedge shaped peripheral perfusion defects to suggest acute pulmonary embolism. IMPRESSION: No evidence of pulmonary embolism. Electronically Signed   By: Franki Cabot M.D.   On: 01/29/2018 16:35    ASSESSMENT:  Iron deficiency  anemia secondary to chronic blood loss, shortness of breath.   PLAN:    1. Iron deficiency anemia secondary to chronic blood loss: Patient's hemoglobin is decreased, but stable at 10.6.  Her iron stores continue to be within normal limits.  She does not require additional Feraheme today.  Previously, patient was reported to be intolerant of oral iron supplementation.  Patient had colonoscopy and EGD in August 2018.  Return to clinic in 2 months with repeat laboratory work and further evaluation.   2. Pulmonary nodules: Repeat CT scan from January 13, 2017 revealed stable pulmonary nodules.  No further imaging is necessary.   3. Renal insufficiency: Mild, monitor. 4.  Hypertension: Blood pressure moderately elevated today.  Continue monitoring and treatment per primary care.   5.  History of GI bleed: Colonoscopy and EGD in August 2018.  Continue follow-up with GI as indicated. 6.  Hoarseness of voice: Resolved.   Patient expressed understanding and was in agreement with this plan. She also understands that She can call clinic at any time with any questions, concerns, or complaints.    Lloyd Huger, MD   02/24/2018 7:31 AM

## 2018-02-22 ENCOUNTER — Encounter: Payer: Self-pay | Admitting: Oncology

## 2018-02-22 ENCOUNTER — Other Ambulatory Visit: Payer: Self-pay

## 2018-02-22 ENCOUNTER — Inpatient Hospital Stay: Payer: Medicare Other

## 2018-02-22 ENCOUNTER — Inpatient Hospital Stay (HOSPITAL_BASED_OUTPATIENT_CLINIC_OR_DEPARTMENT_OTHER): Payer: Medicare Other | Admitting: Oncology

## 2018-02-22 ENCOUNTER — Inpatient Hospital Stay: Payer: Medicare Other | Attending: Oncology

## 2018-02-22 VITALS — BP 153/53 | HR 70 | Temp 97.0°F | Resp 18 | Wt 172.0 lb

## 2018-02-22 DIAGNOSIS — D500 Iron deficiency anemia secondary to blood loss (chronic): Secondary | ICD-10-CM | POA: Diagnosis not present

## 2018-02-22 DIAGNOSIS — R918 Other nonspecific abnormal finding of lung field: Secondary | ICD-10-CM | POA: Diagnosis not present

## 2018-02-22 DIAGNOSIS — Z7984 Long term (current) use of oral hypoglycemic drugs: Secondary | ICD-10-CM

## 2018-02-22 DIAGNOSIS — I10 Essential (primary) hypertension: Secondary | ICD-10-CM | POA: Diagnosis not present

## 2018-02-22 DIAGNOSIS — D649 Anemia, unspecified: Secondary | ICD-10-CM

## 2018-02-22 DIAGNOSIS — R0602 Shortness of breath: Secondary | ICD-10-CM | POA: Insufficient documentation

## 2018-02-22 DIAGNOSIS — Z79899 Other long term (current) drug therapy: Secondary | ICD-10-CM

## 2018-02-22 DIAGNOSIS — N183 Chronic kidney disease, stage 3 (moderate): Secondary | ICD-10-CM | POA: Insufficient documentation

## 2018-02-22 LAB — CBC WITH DIFFERENTIAL/PLATELET
Abs Immature Granulocytes: 0.03 10*3/uL (ref 0.00–0.07)
Basophils Absolute: 0 10*3/uL (ref 0.0–0.1)
Basophils Relative: 1 %
EOS ABS: 0.2 10*3/uL (ref 0.0–0.5)
EOS PCT: 3 %
HCT: 32.6 % — ABNORMAL LOW (ref 36.0–46.0)
Hemoglobin: 10.6 g/dL — ABNORMAL LOW (ref 12.0–15.0)
Immature Granulocytes: 1 %
Lymphocytes Relative: 26 %
Lymphs Abs: 1.6 10*3/uL (ref 0.7–4.0)
MCH: 31 pg (ref 26.0–34.0)
MCHC: 32.5 g/dL (ref 30.0–36.0)
MCV: 95.3 fL (ref 80.0–100.0)
Monocytes Absolute: 0.6 10*3/uL (ref 0.1–1.0)
Monocytes Relative: 9 %
Neutro Abs: 3.8 10*3/uL (ref 1.7–7.7)
Neutrophils Relative %: 60 %
Platelets: 198 10*3/uL (ref 150–400)
RBC: 3.42 MIL/uL — ABNORMAL LOW (ref 3.87–5.11)
RDW: 12.7 % (ref 11.5–15.5)
WBC: 6.3 10*3/uL (ref 4.0–10.5)
nRBC: 0 % (ref 0.0–0.2)

## 2018-02-22 LAB — IRON AND TIBC
Iron: 67 ug/dL (ref 28–170)
SATURATION RATIOS: 30 % (ref 10.4–31.8)
TIBC: 227 ug/dL — AB (ref 250–450)
UIBC: 160 ug/dL

## 2018-02-22 LAB — SAMPLE TO BLOOD BANK

## 2018-02-22 LAB — FERRITIN: Ferritin: 248 ng/mL (ref 11–307)

## 2018-02-22 NOTE — Progress Notes (Signed)
Patient reports she continues to have shortness of breath, denies other concerns.

## 2018-03-22 ENCOUNTER — Other Ambulatory Visit: Payer: Self-pay | Admitting: Cardiovascular Disease

## 2018-03-22 MED ORDER — LISINOPRIL 10 MG PO TABS: 10.0000 mg | ORAL_TABLET | Freq: Every day | ORAL | 3 refills | Status: DC

## 2018-03-22 MED ORDER — HYDRALAZINE HCL 25 MG PO TABS: ORAL_TABLET | ORAL | 2 refills | Status: DC

## 2018-03-22 MED ORDER — SIMVASTATIN 40 MG PO TABS: 40.0000 mg | ORAL_TABLET | Freq: Every day | ORAL | 2 refills | Status: DC

## 2018-03-22 NOTE — Telephone Encounter (Signed)
°*  STAT* If patient is at the pharmacy, call can be transferred to refill team.   1. Which medications need to be refilled? (please list name of each medication and dose if known)     Hydralazine 25 mg po BID prn     Lisinopril 10 mg po q d     Simvastatin 40 mg po qhs  2. Which pharmacy/location (including street and city if local pharmacy) is medication to be sent to? [facility] [facility]  3. Do they need a 30 day or 90 day supply? [place]

## 2018-03-22 NOTE — Telephone Encounter (Signed)
Received request to sent medications to new pharmacy. [facility]

## 2018-03-24 ENCOUNTER — Other Ambulatory Visit: Payer: Medicare Other

## 2018-03-24 DIAGNOSIS — Z515 Encounter for palliative care: Secondary | ICD-10-CM

## 2018-03-25 NOTE — Progress Notes (Signed)
COMMUNITY PALLIATIVE CARE SW NOTE  PATIENT NAME: Amy [name] DOB: 04/03/1927 MRN: [id number]  PRIMARY CARE PROVIDER: [name], MD  RESPONSIBLE PARTY:  Acct ID - Guarantor Home Phone Work Phone Relationship Acct Type  [id number] Temitope Flammer,* [phone]  Self P/F     [address], [place], [place] [address]     PLAN OF CARE and INTERVENTIONS:             1. GOALS OF CARE/ ADVANCE CARE PLANNING:  Patient has DNR in the home. 2. SOCIAL/EMOTIONAL/SPIRITUAL ASSESSMENT/ INTERVENTIONS:  Patient continues to live independently in an apartment behind her grandaughter's home. She is still able to drive short distances and continues to go to exercise classes once or twice a week. Patient alert and oriented during this visit and states that she has not had to have any unplanned physician visits since our last encounter. Patient reports having a good birthday gathering with her family recently and enjoys spending time with them. No concerns noted. 3. PATIENT/CAREGIVER EDUCATION/ COPING:  Patient continues to adjust well and function well within her limitations. She relies on family members for assistance with getting groceries and taking out the trash, but is otherwise independent. No concerns noted at this time. 4. PERSONAL EMERGENCY PLAN:  Patient will utilize her LifeAlert to call for help if needed. 5. COMMUNITY RESOURCES COORDINATION/ HEALTH CARE NAVIGATION:  None at this time. 6. FINANCIAL/LEGAL CONCERNS/INTERVENTIONS:  none     SOCIAL HX:  Social History   Tobacco Use  . Smoking status: Never Smoker  . Smokeless tobacco: Never Used  Substance Use Topics  . Alcohol use: No    CODE STATUS:   Code Status: Prior  ADVANCED DIRECTIVES: N MOST FORM COMPLETE:  no HOSPICE EDUCATION PROVIDED: None this visit, patient not appropriate at this time.  YVD:PBAQVOH is independent for all ADLs. Walks with cane.  I spent 35 minutes with this patient providing consultation and  support from 10:40a-11:15am.      Atha Starks

## 2018-03-29 NOTE — Progress Notes (Signed)
Cardiology Office Note  Date:  04/05/2018   ID:  Amy [name], DOB 1927-07-06, MRN [id number]  PCP:  [name], MD   Chief Complaint  Patient presents with  . other    f/u CHF,SOB with exertion can't lay back becomes much more difficult to breath. wanted checked sooner. Medications reviewed verbally.     HPI:  Amy [name] is a pleasant 85 y.o. woman with a PMHx of:  Coronary Artery Disease  Proximal LAD with stent  Incontinence  COPD exacerbations Chronic baseline shortness of breath Morbid obesity due to long history of poor diet Beta blocker has been held in the past secondary to bradycardia.  Off losartan secondary to hyperkalemia, was on HCTZ who now presents for routine follow up of her Coronary Artery Disease   INTERVAL HISTORY:  She reports for follow up today. She is doing well overall. However, she does report she constantly feels out of breath and adds she has been losing her voice. She went to voice therapy without relief. She has tried chewing gum, which has helped.   Lab work reviewed with her in detail HCT 30 TSH 0.145 HBA1C 5.4 CR 1.7/ up from 1.2  She has been taking Lasix daily for shortness of breath and leg swelling Currently denies any lower extremity edema PND orthopnea  Blood pressure is elevated today. She goes to rehab twice a week and reports they take her pressure at each visit. Otherwise she does not check it at home, even though she does have a wrist cuff. Her average is 140-150.   Of note, her family members have been sick, adding she has been trying to keep her distance from them, even though they live across the street.   EKG personally reviewed by myself on todays visit  shows normal sinus rhythm RBBB rate 62 beats per minute  OTHER PAST MEDICAL HISTORY REVIEWED BY ME AT TODAY'S VISIT:  DATE TEST EF   09/2016 Echo  65-70 %   09/2015 Echo  55-60 %   02/2015 Echo 60-65% mild AS, LVH   2019 09/02/2017 EKG showed normal  sinus rhythm with rate 74 bpm right bundle branch block hospitalization March  2019 Sepsis due to pneumonia, upper GI bleed melena, acute on chronic diastolic CHF progressive shortness of breath with melena on arrival, pneumonia, treated with antibiotics pleural effusion felt secondary to CHF Asa plavix held prbc x1 HCT 25.7 D/c on plavix with no asa  Stress test 03/2017 Pharmacological myocardial perfusion imaging study with no significant Ischemia Small region of fixed apical thinning, consistent with attenuation artifact GI uptake artifact noted Normal wall motion, EF estimated at 56% No EKG changes concerning for ischemia at peak stress or in recovery. Low risk scan  2017 hospitalization mid August 2017 COPD exacerbation, required steroids, DuoNeb's, flutter device, antibiotics CT scan showed mucus plugging, postobstructive atelectasis  2015  [facility] August 2015. She had diarrhea, vomiting, felt to be viral also with acute renal failure that improved with hydration. Creatinine reached 1.86, one month later creatinine 1.0 as an outpatient Continues to be weak, does not walk much and is deconditioned  Guaiac positive and she had colonoscopy and EGD. She reports Dr. Vira Agar did not find anything Iron transfusions in the past under the direction of Dr. Ma Hillock.   Prior episode of shortness of breath while walking in the hospital to schedule a colonoscopy appointment. sent to the emergency room for further evaluation. Systolic pressure was in the 170 range. workup at that  time was essentially normal and she was discharged home.  2013 01/14/2012 Stress test showed no ischemia, Lexiscan  2011 06/22/2009 Cardiac Catheter performed at [facility] details 90% proximal LAD, 40% mid LAD, 40% diagonal, 60% proximal circumflex followed by 40% circumflex lesion, 30%, 40% and 30% lesion noted in the RCA with distal RCA with 30% and 25% lesions. Mild atheroma in the aorta. Xience 2.75 x 12 mm Drug  eluting stent placed.    PMH:   has a past medical history of Bladder incontinence, CAD (coronary artery disease), Cancer ([place]), Carotid arterial disease w/ R Carotid Bruit (HCC), Chronic diastolic (congestive) heart failure (HCC), Chronic Dyspnea on exertion, CKD (chronic kidney disease) stage 3, GFR 30-59 ml/min (HCC), Degenerative arthritis of knee, Diabetes mellitus, GIB (gastrointestinal bleeding), Hiatal hernia, Hypertension, Iron deficiency, Menopausal symptoms, Morbid obesity ([place]), Renal insufficiency, and Thyroid disease.  PSH:    Past Surgical History:  Procedure Laterality Date  . COLONOSCOPY  2015  . COLONOSCOPY WITH PROPOFOL N/A 09/25/2016   Procedure: COLONOSCOPY WITH PROPOFOL;  Surgeon: [name], MD;  Location: [facility] ENDOSCOPY;  Service: Gastroenterology;  Laterality: N/A;  . COLONOSCOPY WITH PROPOFOL N/A 09/26/2016   Procedure: COLONOSCOPY WITH PROPOFOL;  Surgeon: [name], MD;  Location: [facility] ENDOSCOPY;  Service: Gastroenterology;  Laterality: N/A;  . ESOPHAGOGASTRODUODENOSCOPY (EGD) WITH PROPOFOL N/A 09/25/2016   Procedure: ESOPHAGOGASTRODUODENOSCOPY (EGD) WITH PROPOFOL;  Surgeon: [name], MD;  Location: [facility] ENDOSCOPY;  Service: Gastroenterology;  Laterality: N/A;  . REPLACEMENT TOTAL KNEE BILATERAL    . TOTAL VAGINAL HYSTERECTOMY     ovarian mass, not cancerous  . UPPER GI ENDOSCOPY  2015    Current Outpatient Medications  Medication Sig Dispense Refill  . albuterol (PROVENTIL HFA;VENTOLIN HFA) 108 (90 Base) MCG/ACT inhaler Inhale 2 puffs into the lungs every 6 (six) hours as needed for wheezing or shortness of breath.    . cloNIDine (CATAPRES - DOSED IN MG/24 HR) 0.2 mg/24hr patch Place 1 patch (0.2 mg total) onto the skin once a week. 4 patch 12  . clopidogrel (PLAVIX) 75 MG tablet Take 1 tablet (75 mg total) by mouth daily. 90 tablet 3  . docusate sodium (COLACE) 100 MG capsule Take 100 mg by mouth daily as needed for mild constipation. Takes daily    .  hydrALAZINE (APRESOLINE) 25 MG tablet Take 1 tablet (25 mg) by mouth two times a day. 180 tablet 2  . ipratropium-albuterol (DUONEB) 0.5-2.5 (3) MG/3ML SOLN Take 3 mLs by nebulization every 4 (four) hours as needed (shortness of breath/wheezing.). 360 mL   . levothyroxine (SYNTHROID, LEVOTHROID) 125 MCG tablet Take 125 mcg by mouth daily.    Marland Kitchen lisinopril (PRINIVIL,ZESTRIL) 10 MG tablet Take 1 tablet (10 mg total) by mouth daily. 90 tablet 3  . metFORMIN (GLUCOPHAGE) 1000 MG tablet Take 1,000 mg by mouth 2 (two) times daily with a meal.    . oxybutynin (DITROPAN-XL) 5 MG 24 hr tablet Take 5 mg by mouth daily.     . pantoprazole (PROTONIX) 20 MG tablet Take 20 mg by mouth daily.    . simvastatin (ZOCOR) 40 MG tablet Take 1 tablet (40 mg total) by mouth at bedtime. 90 tablet 2  . SYMBICORT 160-4.5 MCG/ACT inhaler Inhale 2 puffs into the lungs 2 (two) times daily.      No current facility-administered medications for this visit.    Facility-Administered Medications Ordered in Other Visits  Medication Dose Route Frequency Provider Last Rate Last Dose  . 0.9 %  sodium chloride infusion  Intravenous Continuous Lloyd Huger, MD      . ferumoxytol [facility]) 510 mg in sodium chloride 0.9 % 100 mL IVPB  510 mg Intravenous Once Lloyd Huger, MD         Allergies:   Losartan; Morphine and related; Atenolol; Codeine; Nsaids; Rofecoxib; Sucralfate; and Sulfa antibiotics   Social History:  The patient  reports that she has never smoked. She has never used smokeless tobacco. She reports that she does not drink alcohol or use drugs.   Family History:   family history includes Breast cancer (age of onset: 39) in her mother.    Review of Systems: Review of Systems  Constitutional: Negative.   Respiratory: Positive for shortness of breath.   Cardiovascular: Negative.   Gastrointestinal: Negative.   Musculoskeletal: Negative.        Gait instability  Neurological: Negative.    Psychiatric/Behavioral: Negative.   All other systems reviewed and are negative.    PHYSICAL EXAM: VS:  BP (!) 162/40 (BP Location: Left Arm, Patient Position: Sitting, Cuff Size: Normal)   Pulse 62   Ht 5\' 2"  (1.575 m)   Wt 169 lb (76.7 kg)   BMI 30.91 kg/m  , BMI Body mass index is 30.91 kg/m.  Constitutional:  oriented to person, place, and time. No distress.  Obese HENT:  Head: Grossly normal Eyes:  no discharge. No scleral icterus.  Neck: No JVD, no carotid bruits  Cardiovascular: Regular rate and rhythm, no murmurs appreciated Pulmonary/Chest: Clear to auscultation bilaterally, no wheezes or rails Abdominal: Soft.  no distension.  no tenderness.  Musculoskeletal: Normal range of motion Neurological:  normal muscle tone. Coordination normal. No atrophy Skin: Skin warm and dry Psychiatric: normal affect, pleasant   Recent Labs: 05/15/2017: ALT 11 05/20/2017: BUN 18; Creatinine, Ser 1.32; Potassium 4.1; Sodium 140 02/22/2018: Hemoglobin 10.6; Platelets 198    Lipid Panel Lab Results  Component Value Date   CHOL 135 06/25/2010   HDL 35 (L) 06/25/2010   LDLCALC 56 06/25/2010   TRIG 220 (H) 06/25/2010      Wt Readings from Last 3 Encounters:  04/05/18 169 lb (76.7 kg)  02/22/18 172 lb (78 kg)  02/03/18 160 lb (72.6 kg)       ASSESSMENT AND PLAN:  SOB (shortness of breath) Plan: EKG 12-Lead Baseline deconditioning, anemia, HTN, COPD, diastolic CHF We discussed each of these parameters with her Long discussion concerning her need continued physical therapy walking swimming For baseline conditioning Appears prerenal based off recent lab work  Coronary artery disease of native artery of native heart with stable angina pectoris ([place])  Plan: no anginal symptoms. Continue current meds. Stay on Plavix and simvastatin  Essential hypertension  Plan: EKG 12-Lead Monitor pressure at home.  Not changing medication today. Will follow up with Arbie Cookey from [employer] for BP  Chronic diastolic CHF (congestive heart failure) ([place]) Plan: Recommend decreasing Lasix to PRN  Paroxysmal atrial fibrillation (HCC)  Plan: EKG 12-Lead Maintaining normal sinus rhythm  Not on anticoagulation stable Fall risk  COPD exacerbation ([place]) Plan: EKG 12-Lead Use albuterol if needed.  Secondhand smoke exposure  Acute renal failure superimposed on stage 3 chronic kidney disease, unspecified acute renal failure type ([place]) Plan: creatinine 1.7, above her baseline 1.2 Suggest taking lasix as needed and drinking more water  Anemia, unspecified type Plan: Unchanged Suggest iron infusion.   Voice changes Plan: Suspect her throat is dry and recommend she use a humidifier at home. Use cough drops  and drink water.   Total encounter time more than 25 minutes. Greater than 50% was spent in counseling and coordination of care with the patient.  Disposition:   F/U  6 months  I, Margit Banda am acting as a scribe for Ida Rogue, M.D., Ph.D.  I have reviewed the above documentation for accuracy and completeness, and I agree with the above.   Signed, Esmond Plants, M.D., Ph.D. 04/05/2018  [place], [place]

## 2018-04-04 NOTE — Patient Instructions (Addendum)
We will try to obtain the blood pressure measurements from [facility] Amy [name]  Monitor pressures at home  Medication Instructions:   1) Please take lasix 20 mg- 1 tablet by mouth once daily as needed  2) You should be taking Hydralazine 25- take 1 tablet by mouth twice a day  If you need a refill on your cardiac medications before your next appointment, please call your pharmacy.    Lab work: No new labs needed   If you have labs (blood work) drawn today and your tests are completely normal, you will receive your results only by:  [place] (if you have MyChart) OR  A paper copy in the mail If you have any lab test that is abnormal or we need to change your treatment, we will call you to review the results.   Testing/Procedures: No new testing needed   Follow-Up: At [facility], you and your health needs are our priority.  As part of our continuing mission to provide you with exceptional heart care, we have created designated Provider Care Teams.  These Care Teams include your primary Cardiologist (physician) and Advanced Practice Providers (APPs -  Physician Assistants and Nurse Practitioners) who all work together to provide you with the care you need, when you need it.   You will need a follow up appointment in 6 months    Please call our office 2 months in advance to schedule this appointment.     Providers on your designated Care Team:    Murray Hodgkins, NP  Christell Faith, PA-C  Marrianne Mood, PA-C  Any Other Special Instructions Will Be Listed Below (If Applicable).  For educational health videos Log in to : [email or web address] Or : [email or web address], password : [email or web address]

## 2018-04-05 ENCOUNTER — Encounter: Payer: Self-pay | Admitting: Cardiovascular Disease

## 2018-04-05 ENCOUNTER — Ambulatory Visit (INDEPENDENT_AMBULATORY_CARE_PROVIDER_SITE_OTHER): Payer: Medicare Other | Admitting: Cardiovascular Disease

## 2018-04-05 VITALS — BP 162/40 | HR 62 | Ht 62.0 in | Wt 169.0 lb

## 2018-04-05 DIAGNOSIS — I480 Paroxysmal atrial fibrillation: Secondary | ICD-10-CM

## 2018-04-05 DIAGNOSIS — E1122 Type 2 diabetes mellitus with diabetic chronic kidney disease: Secondary | ICD-10-CM | POA: Diagnosis not present

## 2018-04-05 DIAGNOSIS — E782 Mixed hyperlipidemia: Secondary | ICD-10-CM

## 2018-04-05 DIAGNOSIS — D649 Anemia, unspecified: Secondary | ICD-10-CM

## 2018-04-05 DIAGNOSIS — I5032 Chronic diastolic (congestive) heart failure: Secondary | ICD-10-CM | POA: Diagnosis not present

## 2018-04-05 DIAGNOSIS — R0609 Other forms of dyspnea: Secondary | ICD-10-CM

## 2018-04-05 DIAGNOSIS — I10 Essential (primary) hypertension: Secondary | ICD-10-CM

## 2018-04-05 DIAGNOSIS — N183 Chronic kidney disease, stage 3 (moderate): Secondary | ICD-10-CM

## 2018-04-05 DIAGNOSIS — I25118 Atherosclerotic heart disease of native coronary artery with other forms of angina pectoris: Secondary | ICD-10-CM

## 2018-04-05 MED ORDER — FUROSEMIDE 20 MG PO TABS: ORAL_TABLET | ORAL | Status: DC

## 2018-04-06 ENCOUNTER — Telehealth: Payer: Self-pay | Admitting: Cardiovascular Disease

## 2018-04-06 NOTE — Addendum Note (Signed)
Addended by: Alba Destine on: 04/06/2018 03:31 PM   Modules accepted: Orders

## 2018-04-06 NOTE — Telephone Encounter (Signed)
Received documents from [facility] rehab re BP issues.  Placed in nurse box.

## 2018-04-07 ENCOUNTER — Other Ambulatory Visit: Payer: Medicare Other

## 2018-04-07 DIAGNOSIS — Z515 Encounter for palliative care: Secondary | ICD-10-CM

## 2018-04-07 NOTE — Progress Notes (Signed)
PATIENT NAME: Amy [name] DOB: 04-24-1927 MRN: [id number]  PRIMARY CARE PROVIDER: [name], MD  RESPONSIBLE PARTY:  Acct ID - Guarantor Home Phone Work Phone Relationship Acct Type  [id number] Amy [name],* [phone]  Self P/F     [address], [place], [place] [address]    PLAN OF CARE and INTERVENTIONS:               1.  GOALS OF CARE/ ADVANCE CARE PLANNING:  Remain at home without infections or falls.               2.  PATIENT/CAREGIVER EDUCATION:  Fall precautions / Education on signs and symptoms of infections.                4. PERSONAL EMERGENCY PLAN:  In Place ([facility])               5.  DISEASE STATUS:Increasing weakness, patient reports feeling breathless.   HISTORY OF PRESENT ILLNESS:    CODE STATUS: Full Code  ADVANCED DIRECTIVES: Y MOST FORM: No PPS: 50%   PHYSICAL EXAM:   VITALS: See Vital signs  LUNGS: negative findings: lungs clear to auscultation CARDIAC: Cor [place]}  EXTREMITIES: Trace edema SKIN: Skin color, texture, turgor normal. No rashes or lesions  NEURO: Alert and oriented x 4  Palliative Care visit made to patient's home.  Patient sitting on sofa, alert and oriented x 4.  Patient denies having any pain at the present time.  Patient reports she feels weak and is having increased breathlessness.  Patient reports she saw cardiac MD.  Patient reports MD feels like breathlessness is related to her cardiac multiple medical issues.  Patient also having issues with her diastolic pressure. Patient reports MD informed her no meds can be given to raise diastolic pressure.  Patient continues to see Dr Grayland Ormond every 2 months and receives Iron transfusions as needed. Education to patient on fall precautions and to go from sitting to standing position slowly due to low diastolic blood pressure.  Education to patient on signs and symptoms of infection.  Patient to call Palliative Care with questions or concerns.//VJ                  [name], RN

## 2018-04-07 NOTE — Telephone Encounter (Signed)
Called to Cardiac Rehab to discuss incoming information from RN, Kayleen Memos.  She reports that pt was seen in rehab yesterday and reported that Dr. Rockey Situ wanted to discuss Bps with the nurse.   She sent over records from May 2018 to current.   Kayleen Memos denies any dizziness or shakiness that had been reported in the past with low diastolic pressures. She has been tolerating PT without complaints despite low DBP. BP from 2/11 was "180/0".  Carroll left her direct number for Dr. Rockey Situ if he has any further questions after reviewing rehab chart. The direct number is [phone].  I will place on his desk for further review.

## 2018-04-13 NOTE — Telephone Encounter (Signed)
Blood pressure readings from 120-140's to 50-70's in general. There were some readings ranging 150-170's over 0-50's. Dr. Rockey Situ reviewed all readings provided from [facility] and does not recommend any changes at this time. Called over to rehab and reviewed this information with instructions to call back if any further questions.

## 2018-04-26 ENCOUNTER — Other Ambulatory Visit: Payer: Medicare Other

## 2018-04-26 ENCOUNTER — Ambulatory Visit: Payer: Medicare Other | Admitting: Oncology

## 2018-04-26 ENCOUNTER — Ambulatory Visit: Payer: Medicare Other

## 2018-04-26 NOTE — Progress Notes (Signed)
[facility]  [phone] [phone]  ID: Amy [name] OB: 1928-02-01  MR#: [id number]  JKK#:[id number]  Patient Care Team: [name], MD as PCP - General (Internal Medicine) Rockey Situ Kathlene November, MD as PCP - Cardiology (Cardiology) Minna Merritts, MD as Consulting Physician (Cardiology)  CHIEF COMPLAINT: Iron deficiency anemia secondary to chronic blood loss.  INTERVAL HISTORY: Patient returns to clinic today for repeat laboratory work and further evaluation.  She continues to have chronic weakness and fatigue, but otherwise feels well. She has not noted any further GI bleeds or changes in her bowel movements. She has no neurologic complaints. She denies any recent fevers or illnesses. She denies any chest pain, cough, or hemoptysis.  She reports continued chronic shortness of breath.  She denies any nausea, vomiting, constipation, or diarrhea. She has no urinary complaints.  Patient offers no further specific complaints today.  REVIEW OF SYSTEMS:   Review of Systems  Constitutional: Positive for malaise/fatigue. Negative for fever and weight loss.  HENT: Negative.  Negative for sore throat.   Respiratory: Positive for shortness of breath. Negative for cough, hemoptysis and stridor.   Cardiovascular: Negative.  Negative for chest pain and leg swelling.  Gastrointestinal: Negative.  Negative for abdominal pain, blood in stool and melena.  Genitourinary: Negative.  Negative for hematuria.  Musculoskeletal: Negative.  Negative for back pain.  Skin: Negative.  Negative for rash.  Neurological: Positive for weakness. Negative for sensory change, focal weakness and headaches.  Psychiatric/Behavioral: Negative.  The patient is not nervous/anxious.     As per HPI. Otherwise, a complete review of systems is negative.  PAST MEDICAL HISTORY: Past Medical History:  Diagnosis Date  . Bladder incontinence   . CAD (coronary artery disease)    a. 05/2009 Cath: LAD 90p (Xience 2.75 x 12 mm DES), 60m, D1 40, LCx 40p/m, RCA 30/40/30p/m, RCA 30/25d;  b.  12/2011 Lexiscan MV: no ischemia, breast attenuation artifact, normal EF-->Low risk; c. 10/2016 MV: fixed apical defect, most likely apical thinning and attenuation, No ischemia, EF 60%.  . Cancer ([place])    ovarian  . Carotid arterial disease w/ R Carotid Bruit (HCC)    a. 08/2016 Carotid U/S: 40-59% bilat ICA stenosis - f/u 1 yr.  . Chronic diastolic (congestive) heart failure ([place])    a. Echo 09/2015: EF 55-60% w/ Grade 1 DD, sev Ca2+ MV annulus, mildly dil LA; b. 09/2016 Echo: EF 65-70%, Gr2 DD, mildly dil LA/RA, nl RV fxn.  . Chronic Dyspnea on exertion   . CKD (chronic kidney disease) stage 3, GFR 30-59 ml/min (HCC)   . Degenerative arthritis of knee    bilateral knees  . Diabetes mellitus    Type II  . GIB (gastrointestinal bleeding)    a. 08/2016 Admission w/ presyncope/anemia/melena-->req Transfusion-->endo ok, colonoscopy w/ polyps but no source of bleeding (most likely diverticular).  . Hiatal hernia   . Hypertension   . Iron deficiency   . Menopausal symptoms   . Morbid obesity ([place])   . Renal insufficiency   . Thyroid disease    hypothyroidism    PAST SURGICAL HISTORY: Past Surgical History:  Procedure Laterality Date  . COLONOSCOPY  2015  . COLONOSCOPY WITH PROPOFOL N/A 09/25/2016   Procedure: COLONOSCOPY WITH PROPOFOL;  Surgeon: [name], MD;  Location: [facility] ENDOSCOPY;  Service: Gastroenterology;  Laterality: N/A;  . COLONOSCOPY WITH PROPOFOL N/A 09/26/2016   Procedure: COLONOSCOPY WITH PROPOFOL;  Surgeon: [name], MD;  Location: [facility];  Service: Gastroenterology;  Laterality: N/A;  . ESOPHAGOGASTRODUODENOSCOPY (EGD) WITH PROPOFOL N/A 09/25/2016   Procedure: ESOPHAGOGASTRODUODENOSCOPY (EGD) WITH PROPOFOL;  Surgeon: [name], MD;  Location: [facility] ENDOSCOPY;  Service: Gastroenterology;  Laterality: N/A;  . REPLACEMENT TOTAL KNEE BILATERAL    . TOTAL  VAGINAL HYSTERECTOMY     ovarian mass, not cancerous  . UPPER GI ENDOSCOPY  2015    FAMILY HISTORY Family History  Problem Relation Age of Onset  . Breast cancer Mother 74       ADVANCED DIRECTIVES:    HEALTH MAINTENANCE: Social History   Tobacco Use  . Smoking status: Never Smoker  . Smokeless tobacco: Never Used  Substance Use Topics  . Alcohol use: No  . Drug use: No     Colonoscopy:  PAP:  Bone density:  Lipid panel:  Allergies  Allergen Reactions  . Losartan Other (See Comments)    Hyperkalemia  . Morphine And Related Shortness Of Breath    Rash, difficulty breathing, nausea.   . Atenolol Other (See Comments)    Other reaction(s): Other (See Comments) Decreased heart rate Decreased heart rate  . Codeine     Rash, difficulty breathing, nausea.  . Nsaids Other (See Comments)    Other reaction(s): Unknown  . Rofecoxib     Other reaction(s): Unknown  . Sucralfate Other (See Comments)    Throat tightness  . Sulfa Antibiotics     Other reaction(s): Unknown    Current Outpatient Medications  Medication Sig Dispense Refill  . albuterol (PROVENTIL HFA;VENTOLIN HFA) 108 (90 Base) MCG/ACT inhaler Inhale 2 puffs into the lungs every 6 (six) hours as needed for wheezing or shortness of breath.    . cloNIDine (CATAPRES - DOSED IN MG/24 HR) 0.2 mg/24hr patch Place 1 patch (0.2 mg total) onto the skin once a week. 4 patch 12  . clopidogrel (PLAVIX) 75 MG tablet Take 1 tablet (75 mg total) by mouth daily. 90 tablet 3  . docusate sodium (COLACE) 100 MG capsule Take 100 mg by mouth daily as needed for mild constipation. Takes daily    . furosemide (LASIX) 20 MG tablet Take 1 tablet (20 mg) by mouth once daily as needed for weight gain/ shortness of breath/ swelling    . hydrALAZINE (APRESOLINE) 25 MG tablet Take 1 tablet (25 mg) by mouth two times a day. 180 tablet 2  . ipratropium-albuterol (DUONEB) 0.5-2.5 (3) MG/3ML SOLN Take 3 mLs by nebulization every 4 (four)  hours as needed (shortness of breath/wheezing.). 360 mL   . levothyroxine (SYNTHROID, LEVOTHROID) 125 MCG tablet Take 125 mcg by mouth daily.    Marland Kitchen lisinopril (PRINIVIL,ZESTRIL) 10 MG tablet Take 1 tablet (10 mg total) by mouth daily. 90 tablet 3  . metFORMIN (GLUCOPHAGE) 1000 MG tablet Take 1,000 mg by mouth 2 (two) times daily with a meal.    . oxybutynin (DITROPAN-XL) 5 MG 24 hr tablet Take 5 mg by mouth daily.     . pantoprazole (PROTONIX) 20 MG tablet Take 20 mg by mouth daily.    . simvastatin (ZOCOR) 40 MG tablet Take 1 tablet (40 mg total) by mouth at bedtime. 90 tablet 2  . SYMBICORT 160-4.5 MCG/ACT inhaler Inhale 2 puffs into the lungs 2 (two) times daily.      No current facility-administered medications for this visit.    Facility-Administered Medications Ordered in Other Visits  Medication Dose Route Frequency Provider Last Rate Last Dose  . 0.9 %  sodium chloride infusion   Intravenous Continuous  Lloyd Huger, MD      . ferumoxytol [facility]) 510 mg in sodium chloride 0.9 % 100 mL IVPB  510 mg Intravenous Once Lloyd Huger, MD        OBJECTIVE: Vitals:   04/27/18 1015  BP: (!) 178/59  Pulse: 70  Resp: 18  Temp: (!) 97.2 F (36.2 C)     Body mass index is 31.64 kg/m.    ECOG FS:2 - Symptomatic, <50% confined to bed  General: Well-developed, well-nourished, no acute distress.  Sitting in a wheelchair. Eyes: Pink conjunctiva, anicteric sclera. HEENT: Normocephalic, moist mucous membranes. Lungs: Clear to auscultation bilaterally. Heart: Regular rate and rhythm. No rubs, murmurs, or gallops. Abdomen: Soft, nontender, nondistended. No organomegaly noted, normoactive bowel sounds. Musculoskeletal: No edema, cyanosis, or clubbing. Neuro: Alert, answering all questions appropriately. Cranial nerves grossly intact. Skin: No rashes or petechiae noted. Psych: Normal affect.  LAB RESULTS:  Lab Results  Component Value Date   NA 140 05/20/2017   K 4.1  05/20/2017   CL 103 05/20/2017   CO2 26 05/20/2017   GLUCOSE 146 (H) 05/20/2017   BUN 18 05/20/2017   CREATININE 1.32 (H) 05/20/2017   CALCIUM 7.8 (L) 05/20/2017   PROT 5.9 (L) 05/15/2017   ALBUMIN 2.9 (L) 05/15/2017   AST 26 05/15/2017   ALT 11 (L) 05/15/2017   ALKPHOS 70 05/15/2017   BILITOT 0.5 05/15/2017   GFRNONAA 35 (L) 05/20/2017   GFRAA 40 (L) 05/20/2017    Lab Results  Component Value Date   WBC 6.0 04/27/2018   NEUTROABS 3.9 04/27/2018   HGB 9.8 (L) 04/27/2018   HCT 30.5 (L) 04/27/2018   MCV 96.8 04/27/2018   PLT 175 04/27/2018   Lab Results  Component Value Date   IRON 53 04/27/2018   TIBC 213 (L) 04/27/2018   IRONPCTSAT 25 04/27/2018    Lab Results  Component Value Date   FERRITIN 106 04/27/2018    STUDIES: No results found.  ASSESSMENT:  Iron deficiency anemia secondary to chronic blood loss, shortness of breath.   PLAN:    1. Iron deficiency anemia secondary to chronic blood loss: Patient's hemoglobin has declined slightly to 9.8, but her iron stores continue to be within normal limits.  We will proceed with one infusion of 510 mg IV Feraheme today.  Previously, patient was reported to be intolerant of oral iron supplementation.  Patient had colonoscopy and EGD in August 2018.  Return to clinic in 2 months with repeat laboratory work and further evaluation.   2. Pulmonary nodules: Repeat CT scan from January 13, 2017 revealed stable pulmonary nodules.  No further imaging is necessary.   3. Renal insufficiency: Patient's creatinine is 1.32 today.  Monitor. 4.  Hypertension: Chronic and unchanged.  Blood pressure moderately elevated today.  Continue monitoring and treatment per primary care.   5.  History of GI bleed: Colonoscopy and EGD in August 2018.  Continue follow-up with GI as indicated.  Patient expressed understanding and was in agreement with this plan. She also understands that She can call clinic at any time with any questions, concerns, or  complaints.    Lloyd Huger, MD   04/29/2018 6:26 AM

## 2018-04-27 ENCOUNTER — Inpatient Hospital Stay: Payer: Medicare Other | Attending: Oncology

## 2018-04-27 ENCOUNTER — Inpatient Hospital Stay (HOSPITAL_BASED_OUTPATIENT_CLINIC_OR_DEPARTMENT_OTHER): Payer: Medicare Other | Admitting: Oncology

## 2018-04-27 ENCOUNTER — Inpatient Hospital Stay: Payer: Medicare Other

## 2018-04-27 ENCOUNTER — Encounter: Payer: Self-pay | Admitting: Oncology

## 2018-04-27 ENCOUNTER — Other Ambulatory Visit: Payer: Self-pay

## 2018-04-27 VITALS — BP 178/59 | HR 70 | Temp 97.2°F | Resp 18 | Wt 173.0 lb

## 2018-04-27 VITALS — BP 158/62 | HR 64 | Temp 97.3°F | Resp 18

## 2018-04-27 DIAGNOSIS — D500 Iron deficiency anemia secondary to blood loss (chronic): Secondary | ICD-10-CM | POA: Insufficient documentation

## 2018-04-27 DIAGNOSIS — R911 Solitary pulmonary nodule: Secondary | ICD-10-CM

## 2018-04-27 DIAGNOSIS — N289 Disorder of kidney and ureter, unspecified: Secondary | ICD-10-CM | POA: Diagnosis not present

## 2018-04-27 DIAGNOSIS — R0602 Shortness of breath: Secondary | ICD-10-CM

## 2018-04-27 DIAGNOSIS — Z8719 Personal history of other diseases of the digestive system: Secondary | ICD-10-CM | POA: Insufficient documentation

## 2018-04-27 DIAGNOSIS — R5382 Chronic fatigue, unspecified: Secondary | ICD-10-CM

## 2018-04-27 DIAGNOSIS — R531 Weakness: Secondary | ICD-10-CM

## 2018-04-27 DIAGNOSIS — I10 Essential (primary) hypertension: Secondary | ICD-10-CM | POA: Insufficient documentation

## 2018-04-27 DIAGNOSIS — D649 Anemia, unspecified: Secondary | ICD-10-CM

## 2018-04-27 LAB — SAMPLE TO BLOOD BANK

## 2018-04-27 LAB — CBC WITH DIFFERENTIAL/PLATELET
Abs Immature Granulocytes: 0.02 10*3/uL (ref 0.00–0.07)
Basophils Absolute: 0 10*3/uL (ref 0.0–0.1)
Basophils Relative: 0 %
Eosinophils Absolute: 0.1 10*3/uL (ref 0.0–0.5)
Eosinophils Relative: 2 %
HCT: 30.5 % — ABNORMAL LOW (ref 36.0–46.0)
HEMOGLOBIN: 9.8 g/dL — AB (ref 12.0–15.0)
Immature Granulocytes: 0 %
Lymphocytes Relative: 22 %
Lymphs Abs: 1.3 10*3/uL (ref 0.7–4.0)
MCH: 31.1 pg (ref 26.0–34.0)
MCHC: 32.1 g/dL (ref 30.0–36.0)
MCV: 96.8 fL (ref 80.0–100.0)
Monocytes Absolute: 0.6 10*3/uL (ref 0.1–1.0)
Monocytes Relative: 9 %
Neutro Abs: 3.9 10*3/uL (ref 1.7–7.7)
Neutrophils Relative %: 67 %
Platelets: 175 10*3/uL (ref 150–400)
RBC: 3.15 MIL/uL — ABNORMAL LOW (ref 3.87–5.11)
RDW: 12.5 % (ref 11.5–15.5)
WBC: 6 10*3/uL (ref 4.0–10.5)
nRBC: 0 % (ref 0.0–0.2)

## 2018-04-27 LAB — IRON AND TIBC
Iron: 53 ug/dL (ref 28–170)
Saturation Ratios: 25 % (ref 10.4–31.8)
TIBC: 213 ug/dL — ABNORMAL LOW (ref 250–450)
UIBC: 160 ug/dL

## 2018-04-27 LAB — FERRITIN: Ferritin: 106 ng/mL (ref 11–307)

## 2018-04-27 MED ORDER — SODIUM CHLORIDE 0.9 % IV SOLN
INTRAVENOUS | Status: DC
  Administered 2018-04-27: 12:00:00 via INTRAVENOUS
  Filled 2018-04-27: qty 250

## 2018-04-27 MED ORDER — SODIUM CHLORIDE 0.9 % IV SOLN
510.0000 mg | Freq: Once | INTRAVENOUS | Status: AC
  Administered 2018-04-27: 510 mg via INTRAVENOUS
  Filled 2018-04-27: qty 17

## 2018-04-27 NOTE — Progress Notes (Signed)
Patient denies any concerns today.  

## 2018-05-03 ENCOUNTER — Encounter: Payer: Self-pay | Admitting: Podiatry

## 2018-05-03 ENCOUNTER — Ambulatory Visit (INDEPENDENT_AMBULATORY_CARE_PROVIDER_SITE_OTHER): Payer: Medicare Other | Admitting: Podiatry

## 2018-05-03 DIAGNOSIS — M79676 Pain in unspecified toe(s): Secondary | ICD-10-CM | POA: Diagnosis not present

## 2018-05-03 DIAGNOSIS — B351 Tinea unguium: Secondary | ICD-10-CM | POA: Diagnosis not present

## 2018-05-03 DIAGNOSIS — D689 Coagulation defect, unspecified: Secondary | ICD-10-CM

## 2018-05-03 DIAGNOSIS — E119 Type 2 diabetes mellitus without complications: Secondary | ICD-10-CM

## 2018-05-03 NOTE — Progress Notes (Signed)
Complaint:  Visit Type: Patient returns to my office for continued preventative foot care services. Complaint: Patient states" my nails have grown long and thick and become painful to walk and wear shoes" Patient has been diagnosed with DM with no foot complications. The patient presents for preventative foot care services. No changes to ROS.  Patient is taking plavix.  Podiatric Exam: Vascular: dorsalis pedis and posterior tibial pulses are palpable bilateral. Capillary return is immediate. Temperature gradient is WNL. Skin turgor WNL  Sensorium: Normal Semmes Weinstein monofilament test. Normal tactile sensation bilaterally. Nail Exam: Pt has thick disfigured discolored nails with subungual debris noted bilateral entire nail hallux through fifth toenails Ulcer Exam: There is no evidence of ulcer or pre-ulcerative changes or infection. Orthopedic Exam: Muscle tone and strength are WNL. No limitations in general ROM. No crepitus or effusions noted. Foot type and digits show no abnormalities. Bony prominences are unremarkable. Skin: No Porokeratosis. No infection or ulcers  Diagnosis:  Onychomycosis, , Pain in right toe, pain in left toes  Treatment & Plan Procedures and Treatment: Consent by patient was obtained for treatment procedures. The patient understood the discussion of treatment and procedures well. All questions were answered thoroughly reviewed. Debridement of mycotic and hypertrophic toenails, 1 through 5 bilateral and clearing of subungual debris. No ulceration, no infection noted.  Return Visit-Office Procedure: Patient instructed to return to the office for a follow up visit 10 weeks  for continued evaluation and treatment.    Gardiner Barefoot DPM

## 2018-05-07 ENCOUNTER — Other Ambulatory Visit: Payer: Medicare Other

## 2018-05-07 ENCOUNTER — Other Ambulatory Visit: Payer: Self-pay

## 2018-05-07 DIAGNOSIS — Z515 Encounter for palliative care: Secondary | ICD-10-CM

## 2018-05-07 NOTE — Progress Notes (Signed)
COMMUNITY PALLIATIVE CARE SW NOTE  PATIENT NAME: Amy [name] DOB: 02/23/1928 MRN: [id number]  PRIMARY CARE PROVIDER: [name], MD  RESPONSIBLE PARTY:  Acct ID - Guarantor Home Phone Work Phone Relationship Acct Type  [id number] Ronnita Paz,* [phone]  Self P/F     [address], [place], [place] [address]     PLAN OF CARE and INTERVENTIONS:             1. GOALS OF CARE/ ADVANCE CARE PLANNING:  DNR in home.  2. SOCIAL/EMOTIONAL/SPIRITUAL ASSESSMENT/ INTERVENTIONS:  SW and RN made visit to patient's home. Patient alert and oriented and pleasant. She continues to get out and go to physical therapy every week and wondered if she should postpone going for a couple weeks due to [place] virus. RN and SW encouraged patient to heir on the side of caution as the CDC is recommending avoiding public places with crowds. Patient agrees. Patient continues to live alone and has her grandaughter and daughter checking on her frequently. She continues to be supported by her church, [employer] in [place].  3. PATIENT/CAREGIVER EDUCATION/ COPING:  n/a 4. PERSONAL EMERGENCY PLAN:  Patient has life alert and will call 911 for emergencies.  5. COMMUNITY RESOURCES COORDINATION/ HEALTH CARE NAVIGATION:  Patient continues to keep and attend regularly scheduled doctors appointments. No changes/needs identified.  6. FINANCIAL/LEGAL CONCERNS/INTERVENTIONS:  none     SOCIAL HX:  Social History   Tobacco Use  . Smoking status: Never Smoker  . Smokeless tobacco: Never Used  Substance Use Topics  . Alcohol use: No    CODE STATUS:   Code Status: Prior  ADVANCED DIRECTIVES: N MOST FORM COMPLETE:  no HOSPICE EDUCATION PROVIDED: nond  [id number] remains independent for all ADLs.   I spent 60 minutes with patient from 10:00am-11:00am providing support.       Atha Starks

## 2018-05-07 NOTE — Progress Notes (Signed)
PATIENT NAME: Amy [name] DOB: March 20, 1927 MRN: [id number]  PRIMARY CARE PROVIDER: [name], MD  RESPONSIBLE PARTY:  Acct ID - Guarantor Home Phone Work Phone Relationship Acct Type  [id number] Amy [name],* [phone]  Self P/F     [address], [place], [place] [address]    PLAN OF CARE and INTERVENTIONS:               1.  GOALS OF CARE/ ADVANCE CARE PLANNING:Remain in her home.                   2.  PATIENT/CAREGIVER EDUCATION:Education to patient on fall precautions.  Education to patient to take meds as directed to manage symptoms.                3.  DISEASE STATUS:Patient is a 85 year old patient who resides I her home with family checking on her frequently.     HISTORY OF PRESENT ILLNESS:    CODE STATUS:Unknown   ADVANCED DIRECTIVES: Y MOST FORM: No PPS: 40%   PHYSICAL EXAM:   VITALS: Today's Vitals   05/07/18 1015  BP: (!) 158/20  Pulse: (!) 55  Resp: 16  SpO2: 99%    LUNGS: decreased breath sounds CARDIAC: Cor irreg, irreg RRR and Cor Brady  EXTREMITIES: 2+ and pitting edema SKIN: Skin color, texture, turgor normal. No rashes or lesions  NEURO: negative for memory problems   Palliative Care visit made with SW Hessie Diener to patient;s home.  Patient sitting on sofa when SW and RN arrive.  Patient reports she feels weak and gets short of breath with minimal exertion.  Patient's systolic blood pressure continues to run high and diastolic pressure bottoms out and can be heard at 20 when blood pressure is taken.  Patient reports MD has informed her nothing can be done medically for her.  Patient reports she has been having increased pain in her right knee.  Patient had knee replacement years ago and feels like pain is due to knee replacement wearing out.  Patient received Iron infusion last week.  Patient reports she also saw primary MD for B12 injection and received eye injection last week.  Patient continues to go to rehab exercise 2 times per week.   Patient reports her appetite is good.  Patient denies having any needs at the present time. Patient reports she is going to postpone going to rehab for a few weeks.  Patient encouraged to contact Palliative Care with questions or concerns.                  [name], RN

## 2018-05-10 ENCOUNTER — Other Ambulatory Visit: Payer: Self-pay

## 2018-06-10 ENCOUNTER — Telehealth: Payer: Self-pay

## 2018-06-10 NOTE — Telephone Encounter (Signed)
Telephone call to patient, to schedule telephonic palliative care call.  Patient in agreement with RN calling 06-16-18 at 10:00 AM for telephonic visit.  Patient has IPAD but is not comfortable to using to complete TELEMED visit.  Patient reports she "is doing pretty good"  Patient informed to call with concerns.

## 2018-06-16 ENCOUNTER — Other Ambulatory Visit: Payer: Self-pay

## 2018-06-16 ENCOUNTER — Other Ambulatory Visit: Payer: Medicare Other

## 2018-06-16 DIAGNOSIS — Z515 Encounter for palliative care: Secondary | ICD-10-CM

## 2018-06-16 NOTE — Progress Notes (Signed)
PATIENT NAME: Amy [name] DOB: December 02, 1927 MRN: [id number]  PRIMARY CARE PROVIDER: [name], MD  RESPONSIBLE PARTY:  Acct ID - Guarantor Home Phone Work Phone Relationship Acct Type  [id number] Amy [name],* [phone]  Self P/F     [address], [place], [place] [address]    PLAN OF CARE and INTERVENTIONS:               1.  GOALS OF CARE/ ADVANCE CARE PLANNING: Remain in her home as long as she can safely do so.                 2.  PATIENT/CAREGIVER EDUCATION: Education on fall precautions, education on s/s of infection fever, chills, cough, weakness, shortness of breath.               3.  DISEASE STATUS: Patient is a 85 year old patient who continues to reside in her own home.  Patient's granddaughters house is next door to patient. Patient alert and oriented x 4.    Due to the COVID-19 crisis, this virtual check-in visit was done via telephone from my office and it was initiated and consent by this patient and or family.  Patient reports she suffered a fall yesterday.  Patient states she was outside standing on the sidewalk.  Patient reports she bent over and fell forward.  Patient states she knows not to do this but went to pick something up and fell.  Patient's granddaughter was putside and assisted patient with getting up.  Patient denies pain but reports she feels sore.  Patient reports she had appointment at the eye center on Monday.  Patient reports she received injection in her eye.  Patient has appointment scheduled on 07-02-18 with [name] and will receive B12 injection.  Patient denies cough but reports having shortness of breath on exertion.  Patient has trace edema in left lower extremity.  Patient takes Lasix as needed for edema.  Patient has no open areas of skin breakdown.  Patient reports she gets enough sleep.  Patient states she will be awake between 2-4 AM.  Patient reports she naps on the sofa during the day.  Patient denies having any needs at the present  time.  Daughter has been delivering patient's meds and groceries to patient.  Patient verbalizes appreciation for telephonic visit.  Patient encouraged to call with questions or concerns.               HISTORY OF PRESENT ILLNESS:    CODE STATUS: DNR in home  ADVANCED DIRECTIVES: Y MOST FORM: No PPS: 50%   PHYSICAL EXAM:   VITALS:Telephonic visit/ no vital signs taken  LUNGS: Denies cough, shortness of breath on exertion  CARDIAC:  EXTREMITIES: Trace edema SKIN: no open lesions   NEURO: Alert and oriented x 4       Amy Simmer, RN

## 2018-07-02 ENCOUNTER — Ambulatory Visit: Payer: Medicare Other | Admitting: Oncology

## 2018-07-02 ENCOUNTER — Ambulatory Visit: Payer: Medicare Other

## 2018-07-02 ENCOUNTER — Other Ambulatory Visit: Payer: Medicare Other

## 2018-07-05 ENCOUNTER — Ambulatory Visit: Payer: Medicare Other

## 2018-07-05 ENCOUNTER — Other Ambulatory Visit: Payer: Medicare Other

## 2018-07-05 ENCOUNTER — Ambulatory Visit: Payer: Medicare Other | Admitting: Oncology

## 2018-07-08 ENCOUNTER — Telehealth: Payer: Self-pay

## 2018-07-08 NOTE — Telephone Encounter (Signed)
Telephone call to schedule telehealth telephonic visit.  Patient in agreement with telephonic visit 07/14/18 at 10:00 AM.

## 2018-07-14 ENCOUNTER — Other Ambulatory Visit: Payer: Self-pay

## 2018-07-14 ENCOUNTER — Other Ambulatory Visit: Payer: Medicare Other

## 2018-07-14 DIAGNOSIS — Z515 Encounter for palliative care: Secondary | ICD-10-CM

## 2018-07-14 NOTE — Progress Notes (Signed)
PATIENT NAME: Amy [name] DOB: 12-16-1927 MRN: [id number]  PRIMARY CARE PROVIDER: [name], MD  RESPONSIBLE PARTY:  Acct ID - Guarantor Home Phone Work Phone Relationship Acct Type  [id number] Thatiana Renbarger,* [phone]  Self P/F     [address], [place], [place] [address]    PLAN OF CARE and INTERVENTIONS:               1.  GOALS OF CARE/ ADVANCE CARE PLANNING: Remain at home with granddaughter living beside her.                2.  PATIENT/CAREGIVER EDUCATION:  Education on fall precautions, education on s/s of infection.               3.  DISEASE STATUS: Patient is a 85 year old patient who lives in her own home with granddaughter living beside her.   Due to the COVID-19 crisis, this visit was done via telephonic from my office and it was initiated and consent by this patient and or family. Patient pleasant, denies having any pain at the present time.  Patient reports she broke a tooth off but is not having pain from tooth.  Patient reports she went to see PCP 07/02/18 and received B12 injection.  Patient reports she has not suffered any more falls.  Patient reports she has been staying in and family has been bringing groceries in to patient.  Patient reports she is planning on going to the beach with daughter to daughters condo next week.  Patient reports she continues to get short of breath on exertion.  Patient denies having any fever or cough.  Patient reports she has edema in her lower extremities left greater than the right.  Patient reports she has been sleeping well at night.  Patient reports MD did do lab work at appointment in May.  Patient denies having any concerns at the present time.  Patient encouraged to call with any questions or concerns.      HISTORY OF PRESENT ILLNESS:    CODE STATUS: DNR in the home  ADVANCED DIRECTIVES: Y MOST FORM: No PPS: 50%   PHYSICAL EXAM:   VITALS: Telephonic visit no vital signs taken  LUNGS: Shortness of breath on exertion,  denies cough CARDIAC:  EXTREMITIES: Trace edema in LE's left greater than the right SKIN: Denies having open areas of skin breakdown  NEURO: Alert and oriented/ forgetful       [name], RN

## 2018-08-02 ENCOUNTER — Ambulatory Visit: Payer: Medicare Other | Admitting: Podiatry

## 2018-08-05 ENCOUNTER — Telehealth: Payer: Self-pay

## 2018-08-05 NOTE — Telephone Encounter (Signed)
Telephone call to schedule telephonic visit with patient.  Patient agreeable to visit 08/12/18 at 10:00 AM.

## 2018-08-12 ENCOUNTER — Other Ambulatory Visit: Payer: Medicare Other

## 2018-08-12 ENCOUNTER — Other Ambulatory Visit: Payer: Self-pay

## 2018-08-12 DIAGNOSIS — Z515 Encounter for palliative care: Secondary | ICD-10-CM

## 2018-08-12 NOTE — Progress Notes (Signed)
PATIENT NAME: Amy [name] DOB: 07/10/1927 MRN: [id number]  PRIMARY CARE PROVIDER: [name], MD  RESPONSIBLE PARTY:  Acct ID - Guarantor Home Phone Work Phone Relationship Acct Type  [id number] Ebelyn Bohnet,* [phone]  Self P/F     [address], [place], [place] [address]    PLAN OF CARE and INTERVENTIONS:               1.  GOALS OF CARE/ ADVANCE CARE PLANNING:  Remain in her home with family assisting her.               2.  PATIENT/CAREGIVER EDUCATION:  Education on fall precautions, education on s/s of infection, education to patient to keep lower extremities elevated to decrease edema.                3.  DISEASE STATUS: Patient is a 85 year old patient who continues to reside in her own home, patient's home is next to her granddaughters.  Due to the COVID-19 crisis, this visit was done via telephonic from my office and it was initiated and consent by this patient and or family. Patient reports she "has been doing pretty good."  Patient states she went to eye MD on Monday and received 2 injections in her eye.  Daughter continues to take patient to MD appointments. Patient denies suffering any falls.  Patient denies having any pain at the present time.  Patient reports she has been staying in unless she has MD appointment.  Patient reports she continues to feel breathless however MD has not given order for O2 as patient's O2 sats have been good.  Patient has heart disease, RN provided education to patient heart disease is likely causing her to feel breathless.  Patient encouraged to rest frequently. RN also provided education on fall precautions as patient suffered a fall 2 months ago.  Patient reports she continues to have a dry cough.  Patient also having increased edema in left lower extremity.  Patient reports she has been taking "fluid pill" daily rather than prn.  Patient reports if she skips a day taking Lasix left feet swells a lot.  Patient states she has a small amount of  swelling in right lower extremity.  Patient states she sleeps well but wakes up from 3:00 AM to 5:00 AM every AM.  Patient reports she goes back to sleep and rest for a few more hours after falling back to sleep.  Patient reports her daughter and granddaughter continue to check on her frequently.  Patient denies having any needs at the present time.  Patient informed to call with questions or concerns.        HISTORY OF PRESENT ILLNESS:    CODE STATUS: DNR in the home  ADVANCED DIRECTIVES: Y MOST FORM: No PPS: 50%   PHYSICAL EXAM:   VITALS:No vital signs taken for this visit telephonic visit  LUNGS: Shortness of breath at rest/ dry nonproductive cough CARDIAC:  EXTREMITIES: Increased edema to LLE, trace edema to RLE, taking Lasix daily SKIN: patient denies having any open wounds or skin breakdown   NEURO: Alert and oriented x 4        [name], RN

## 2018-09-05 NOTE — Progress Notes (Signed)
[place]  [phone] [phone]) [phone]  ID: Amy [name] OB: 11-20-1927  MR#: [id number]  ZJI#:[id number]  Patient Care Team: [name], MD as PCP - General (Internal Medicine) Rockey Situ Kathlene November, MD as PCP - Cardiology (Cardiology) Minna Merritts, MD as Consulting Physician (Cardiology)  CHIEF COMPLAINT: Iron deficiency anemia secondary to chronic blood loss.  INTERVAL HISTORY: Patient returns to clinic today for repeat laboratory work, further evaluation, and consideration of additional Feraheme.  She continues to have chronic weakness and fatigue, but otherwise feels well. She has not noted any further GI bleeds or changes in her bowel movements. She has no neurologic complaints. She denies any recent fevers or illnesses. She denies any chest pain, cough, or hemoptysis.  She reports continued chronic shortness of breath.  She denies any nausea, vomiting, constipation, or diarrhea. She has no urinary complaints.  Patient offers no further specific complaints today.  REVIEW OF SYSTEMS:   Review of Systems  Constitutional: Positive for malaise/fatigue. Negative for fever and weight loss.  HENT: Negative.  Negative for sore throat.   Respiratory: Positive for shortness of breath. Negative for cough, hemoptysis and stridor.   Cardiovascular: Negative.  Negative for chest pain and leg swelling.  Gastrointestinal: Negative.  Negative for abdominal pain, blood in stool and melena.  Genitourinary: Negative.  Negative for hematuria.  Musculoskeletal: Negative.  Negative for back pain.  Skin: Negative.  Negative for rash.  Neurological: Positive for weakness. Negative for sensory change, focal weakness and headaches.  Psychiatric/Behavioral: Negative.  The patient is not nervous/anxious.     As per HPI. Otherwise, a complete review of systems is negative.  PAST MEDICAL HISTORY: Past Medical History:  Diagnosis Date  . Bladder incontinence    . CAD (coronary artery disease)    a. 05/2009 Cath: LAD 90p (Xience 2.75 x 12 mm DES), 70m, D1 40, LCx 40p/m, RCA 30/40/30p/m, RCA 30/25d;  b.  12/2011 Lexiscan MV: no ischemia, breast attenuation artifact, normal EF-->Low risk; c. 10/2016 MV: fixed apical defect, most likely apical thinning and attenuation, No ischemia, EF 60%.  . Cancer ([place])    ovarian  . Carotid arterial disease w/ R Carotid Bruit (HCC)    a. 08/2016 Carotid U/S: 40-59% bilat ICA stenosis - f/u 1 yr.  . Chronic diastolic (congestive) heart failure ([place])    a. Echo 09/2015: EF 55-60% w/ Grade 1 DD, sev Ca2+ MV annulus, mildly dil LA; b. 09/2016 Echo: EF 65-70%, Gr2 DD, mildly dil LA/RA, nl RV fxn.  . Chronic Dyspnea on exertion   . CKD (chronic kidney disease) stage 3, GFR 30-59 ml/min (HCC)   . Degenerative arthritis of knee    bilateral knees  . Diabetes mellitus    Type II  . GIB (gastrointestinal bleeding)    a. 08/2016 Admission w/ presyncope/anemia/melena-->req Transfusion-->endo ok, colonoscopy w/ polyps but no source of bleeding (most likely diverticular).  . Hiatal hernia   . Hypertension   . Iron deficiency   . Menopausal symptoms   . Morbid obesity ([place])   . Renal insufficiency   . Thyroid disease    hypothyroidism    PAST SURGICAL HISTORY: Past Surgical History:  Procedure Laterality Date  . COLONOSCOPY  2015  . COLONOSCOPY WITH PROPOFOL N/A 09/25/2016   Procedure: COLONOSCOPY WITH PROPOFOL;  Surgeon: [name], MD;  Location: [facility] ENDOSCOPY;  Service: Gastroenterology;  Laterality: N/A;  . COLONOSCOPY WITH PROPOFOL N/A 09/26/2016   Procedure: COLONOSCOPY WITH PROPOFOL;  Surgeon: [name],  MD;  Location: [facility] ENDOSCOPY;  Service: Gastroenterology;  Laterality: N/A;  . ESOPHAGOGASTRODUODENOSCOPY (EGD) WITH PROPOFOL N/A 09/25/2016   Procedure: ESOPHAGOGASTRODUODENOSCOPY (EGD) WITH PROPOFOL;  Surgeon: [name], MD;  Location: [facility] ENDOSCOPY;  Service: Gastroenterology;  Laterality: N/A;  . REPLACEMENT  TOTAL KNEE BILATERAL    . TOTAL VAGINAL HYSTERECTOMY     ovarian mass, not cancerous  . UPPER GI ENDOSCOPY  2015    FAMILY HISTORY Family History  Problem Relation Age of Onset  . Breast cancer Mother 75       ADVANCED DIRECTIVES:    HEALTH MAINTENANCE: Social History   Tobacco Use  . Smoking status: Never Smoker  . Smokeless tobacco: Never Used  Substance Use Topics  . Alcohol use: No  . Drug use: No     Colonoscopy:  PAP:  Bone density:  Lipid panel:  Allergies  Allergen Reactions  . Losartan Other (See Comments)    Hyperkalemia  . Morphine And Related Shortness Of Breath    Rash, difficulty breathing, nausea.   . Atenolol Other (See Comments)    Other reaction(s): Other (See Comments) Decreased heart rate Decreased heart rate  . Codeine     Rash, difficulty breathing, nausea.  . Nsaids Other (See Comments)    Other reaction(s): Unknown  . Rofecoxib     Other reaction(s): Unknown  . Sucralfate Other (See Comments)    Throat tightness  . Sulfa Antibiotics     Other reaction(s): Unknown    Current Outpatient Medications  Medication Sig Dispense Refill  . albuterol (PROVENTIL HFA;VENTOLIN HFA) 108 (90 Base) MCG/ACT inhaler Inhale 2 puffs into the lungs every 6 (six) hours as needed for wheezing or shortness of breath.    . cloNIDine (CATAPRES - DOSED IN MG/24 HR) 0.2 mg/24hr patch Place 1 patch (0.2 mg total) onto the skin once a week. 4 patch 12  . clopidogrel (PLAVIX) 75 MG tablet Take 1 tablet (75 mg total) by mouth daily. 90 tablet 3  . docusate sodium (COLACE) 100 MG capsule Take 100 mg by mouth daily as needed for mild constipation. Takes daily    . furosemide (LASIX) 20 MG tablet Take 1 tablet (20 mg) by mouth once daily as needed for weight gain/ shortness of breath/ swelling    . hydrALAZINE (APRESOLINE) 25 MG tablet Take 1 tablet (25 mg) by mouth two times a day. 180 tablet 2  . ipratropium-albuterol (DUONEB) 0.5-2.5 (3) MG/3ML SOLN Take 3 mLs  by nebulization every 4 (four) hours as needed (shortness of breath/wheezing.). 360 mL   . levothyroxine (SYNTHROID, LEVOTHROID) 125 MCG tablet Take 125 mcg by mouth daily.    Marland Kitchen lisinopril (PRINIVIL,ZESTRIL) 10 MG tablet Take 1 tablet (10 mg total) by mouth daily. 90 tablet 3  . metFORMIN (GLUCOPHAGE) 1000 MG tablet Take 1,000 mg by mouth 2 (two) times daily with a meal.    . oxybutynin (DITROPAN-XL) 5 MG 24 hr tablet Take 5 mg by mouth daily.     . pantoprazole (PROTONIX) 20 MG tablet Take 20 mg by mouth daily.    . simvastatin (ZOCOR) 40 MG tablet Take 1 tablet (40 mg total) by mouth at bedtime. 90 tablet 2  . SYMBICORT 160-4.5 MCG/ACT inhaler Inhale 2 puffs into the lungs 2 (two) times daily.      No current facility-administered medications for this visit.    Facility-Administered Medications Ordered in Other Visits  Medication Dose Route Frequency Provider Last Rate Last Dose  . 0.9 %  sodium  chloride infusion   Intravenous Continuous Lloyd Huger, MD      . ferumoxytol [facility]) 510 mg in sodium chloride 0.9 % 100 mL IVPB  510 mg Intravenous Once Lloyd Huger, MD        OBJECTIVE: Vitals:   09/06/18 1059  BP: (!) 150/60  Pulse: (!) 59  Temp: 97.9 F (36.6 C)     Body mass index is 31.46 kg/m.    ECOG FS:2 - Symptomatic, <50% confined to bed  General: Well-developed, well-nourished, no acute distress.  Sitting in a wheelchair. Eyes: Pink conjunctiva, anicteric sclera. HEENT: Normocephalic, moist mucous membranes. Lungs: Clear to auscultation bilaterally. Heart: Regular rate and rhythm. No rubs, murmurs, or gallops. Abdomen: Soft, nontender, nondistended. No organomegaly noted, normoactive bowel sounds. Musculoskeletal: No edema, cyanosis, or clubbing. Neuro: Alert, answering all questions appropriately. Cranial nerves grossly intact. Skin: No rashes or petechiae noted. Psych: Normal affect.  LAB RESULTS:  Lab Results  Component Value Date   NA 140  05/20/2017   K 4.1 05/20/2017   CL 103 05/20/2017   CO2 26 05/20/2017   GLUCOSE 146 (H) 05/20/2017   BUN 18 05/20/2017   CREATININE 1.32 (H) 05/20/2017   CALCIUM 7.8 (L) 05/20/2017   PROT 5.9 (L) 05/15/2017   ALBUMIN 2.9 (L) 05/15/2017   AST 26 05/15/2017   ALT 11 (L) 05/15/2017   ALKPHOS 70 05/15/2017   BILITOT 0.5 05/15/2017   GFRNONAA 35 (L) 05/20/2017   GFRAA 40 (L) 05/20/2017    Lab Results  Component Value Date   WBC 5.5 09/06/2018   NEUTROABS 4.0 09/06/2018   HGB 9.6 (L) 09/06/2018   HCT 29.2 (L) 09/06/2018   MCV 97.3 09/06/2018   PLT 186 09/06/2018   Lab Results  Component Value Date   IRON 42 09/06/2018   TIBC 218 (L) 09/06/2018   IRONPCTSAT 19 09/06/2018    Lab Results  Component Value Date   FERRITIN 173 09/06/2018    STUDIES: No results found.  ASSESSMENT:  Iron deficiency anemia secondary to chronic blood loss, shortness of breath.   PLAN:    1. Iron deficiency anemia secondary to chronic blood loss: Patient's hemoglobin is decreased at 9.6, but essentially stable.  Despite normal iron stores, will proceed with 510 mg IV Feraheme today. Previously, patient was reported to be intolerant of oral iron supplementation.  Patient had colonoscopy and EGD in August 2018 which was unrevealing.  Return to clinic in 3 months with repeat laboratory work and further evaluation.   2. Pulmonary nodules: Repeat CT scan from January 13, 2017 revealed stable pulmonary nodules.  No further imaging is necessary.   3. Renal insufficiency: Continue monitoring per primary care. 4.  Hypertension: Chronic and unchanged.  Blood pressure moderately elevated today.  Continue monitoring and treatment per primary care.   5.  History of GI bleed: Colonoscopy and EGD in August 2018.  Continue follow-up with GI as indicated.  I spent a total of 30 minutes face-to-face with the patient of which greater than 50% of the visit was spent in counseling and coordination of care as detailed  above.   Patient expressed understanding and was in agreement with this plan. She also understands that She can call clinic at any time with any questions, concerns, or complaints.    Lloyd Huger, MD   09/07/2018 7:01 AM

## 2018-09-06 ENCOUNTER — Inpatient Hospital Stay: Payer: Medicare Other

## 2018-09-06 ENCOUNTER — Inpatient Hospital Stay: Payer: Medicare Other | Attending: Oncology

## 2018-09-06 ENCOUNTER — Other Ambulatory Visit: Payer: Self-pay

## 2018-09-06 ENCOUNTER — Encounter: Payer: Self-pay | Admitting: Oncology

## 2018-09-06 ENCOUNTER — Inpatient Hospital Stay (HOSPITAL_BASED_OUTPATIENT_CLINIC_OR_DEPARTMENT_OTHER): Payer: Medicare Other | Admitting: Oncology

## 2018-09-06 ENCOUNTER — Telehealth: Payer: Self-pay

## 2018-09-06 VITALS — BP 170/62 | HR 52

## 2018-09-06 VITALS — BP 150/60 | HR 59 | Temp 97.9°F | Wt 172.0 lb

## 2018-09-06 DIAGNOSIS — D500 Iron deficiency anemia secondary to blood loss (chronic): Secondary | ICD-10-CM | POA: Diagnosis present

## 2018-09-06 DIAGNOSIS — Z8719 Personal history of other diseases of the digestive system: Secondary | ICD-10-CM

## 2018-09-06 DIAGNOSIS — D649 Anemia, unspecified: Secondary | ICD-10-CM

## 2018-09-06 DIAGNOSIS — N289 Disorder of kidney and ureter, unspecified: Secondary | ICD-10-CM | POA: Diagnosis not present

## 2018-09-06 DIAGNOSIS — I10 Essential (primary) hypertension: Secondary | ICD-10-CM | POA: Diagnosis not present

## 2018-09-06 DIAGNOSIS — R911 Solitary pulmonary nodule: Secondary | ICD-10-CM

## 2018-09-06 DIAGNOSIS — R531 Weakness: Secondary | ICD-10-CM

## 2018-09-06 DIAGNOSIS — R0602 Shortness of breath: Secondary | ICD-10-CM

## 2018-09-06 DIAGNOSIS — R5382 Chronic fatigue, unspecified: Secondary | ICD-10-CM

## 2018-09-06 LAB — CBC WITH DIFFERENTIAL/PLATELET
Abs Immature Granulocytes: 0.02 10*3/uL (ref 0.00–0.07)
Basophils Absolute: 0 10*3/uL (ref 0.0–0.1)
Basophils Relative: 0 %
Eosinophils Absolute: 0.1 10*3/uL (ref 0.0–0.5)
Eosinophils Relative: 1 %
HCT: 29.2 % — ABNORMAL LOW (ref 36.0–46.0)
Hemoglobin: 9.6 g/dL — ABNORMAL LOW (ref 12.0–15.0)
Immature Granulocytes: 0 %
Lymphocytes Relative: 18 %
Lymphs Abs: 1 10*3/uL (ref 0.7–4.0)
MCH: 32 pg (ref 26.0–34.0)
MCHC: 32.9 g/dL (ref 30.0–36.0)
MCV: 97.3 fL (ref 80.0–100.0)
Monocytes Absolute: 0.5 10*3/uL (ref 0.1–1.0)
Monocytes Relative: 9 %
Neutro Abs: 4 10*3/uL (ref 1.7–7.7)
Neutrophils Relative %: 72 %
Platelets: 186 10*3/uL (ref 150–400)
RBC: 3 MIL/uL — ABNORMAL LOW (ref 3.87–5.11)
RDW: 12.6 % (ref 11.5–15.5)
WBC: 5.5 10*3/uL (ref 4.0–10.5)
nRBC: 0 % (ref 0.0–0.2)

## 2018-09-06 LAB — IRON AND TIBC
Iron: 42 ug/dL (ref 28–170)
Saturation Ratios: 19 % (ref 10.4–31.8)
TIBC: 218 ug/dL — ABNORMAL LOW (ref 250–450)
UIBC: 176 ug/dL

## 2018-09-06 LAB — SAMPLE TO BLOOD BANK

## 2018-09-06 LAB — FERRITIN: Ferritin: 173 ng/mL (ref 11–307)

## 2018-09-06 MED ORDER — SODIUM CHLORIDE 0.9 % IV SOLN
Freq: Once | INTRAVENOUS | Status: AC
  Administered 2018-09-06: 12:00:00 via INTRAVENOUS
  Filled 2018-09-06: qty 250

## 2018-09-06 MED ORDER — SODIUM CHLORIDE 0.9 % IV SOLN
510.0000 mg | Freq: Once | INTRAVENOUS | Status: AC
  Administered 2018-09-06: 510 mg via INTRAVENOUS
  Filled 2018-09-06: qty 17

## 2018-09-06 NOTE — Telephone Encounter (Signed)
Telephone call to schedule palliative care visit.  Patient requests telephonic palliative care visit 09-08-18 at 9:00 AM.

## 2018-09-06 NOTE — Progress Notes (Signed)
Patient stated that she had been feeling weak and tired. Patient had not noticed any blood loss.

## 2018-09-08 ENCOUNTER — Other Ambulatory Visit: Payer: Medicare Other

## 2018-09-08 ENCOUNTER — Other Ambulatory Visit: Payer: Self-pay

## 2018-09-08 DIAGNOSIS — Z515 Encounter for palliative care: Secondary | ICD-10-CM

## 2018-09-08 NOTE — Progress Notes (Signed)
PATIENT NAME: Amy [name] DOB: September 07, 1927 MRN: [id number]  PRIMARY CARE PROVIDER: [name], MD  RESPONSIBLE PARTY:  Acct ID - Guarantor Home Phone Work Phone Relationship Acct Type  [id number] Jessice, Madill [phone]  Self P/F     [address], [place], [place] [address]    PLAN OF CARE and INTERVENTIONS:               1.  GOALS OF CARE/ ADVANCE CARE PLANNING:  Remain in her home with granddaughter living next door and daughter checking on patient frequently.                2.  PATIENT/CAREGIVER EDUCATION:  Education on fall precautions, education on s/s of infection.                3.  DISEASE STATUS: Patient is a 85 year old patient with heart disease.  Patient continues to reside in her own home. Due to the COVID-19 crisis, this visit was done via telephonic from my office and it was initiated and consent by this patient and or family. Patient requested visit be done via telephone.  Patient reports she feels weak and tired.  Patient states MD has informed her this is due to her heart disease.  Patient received iron transfusion on Monday at the cancer center.  Patient's hemoglobin was 9.7.  Patient states she went to MD's office yesterday and nurse administered B12 injection.  Patient denies having any pain at the present time. Patient reports she continues to feel short of breath. Patient's O2 sats are in the upper 90's when MD checks sats so patient does not qualify for O2.  Patient reports she has been having increased swelling in her lower extremities.  Patient reports she has been taking lasix daily due to increased edema.  Patient states cardiologist does not want her to take lasix daily but she is having to use daily due to edema.  Patient reports she has not seen cardiologist in 6 months.  RN encourages patient to make appointment with MD due to increased edema and using lasix frequently.  Patient states she is also going to ask cardiologist if she needs to continue  seeing pulmonologist as she doe not feel he can do much for her due to her age and disease.  Patient reports she has been sleeping well and naps throughout the day.  Patient states MD made no changes in current medication.  RN reviewed patient's meds with patient. Patient denies suffering any falls. Patient denies having any needs at the present time.  Patient encouraged to call with questions or concerns.             HISTORY OF PRESENT ILLNESS:    CODE STATUS: DNR  ADVANCED DIRECTIVES: Y MOST FORM: No PPS: 50%   PHYSICAL EXAM:   VITALS:Telephonic visit no vital signs taken  LUNGS: shortness of breath CARDIAC:  EXTREMITIES: increased edema in feet and ankles per pt.  Taking lasix daily SKIN: patient denies having any open areas of skin breakdown  NEURO: Alert and oriented x 4       [name], RN

## 2018-09-20 ENCOUNTER — Ambulatory Visit (INDEPENDENT_AMBULATORY_CARE_PROVIDER_SITE_OTHER): Payer: Medicare Other | Admitting: Podiatry

## 2018-09-20 ENCOUNTER — Other Ambulatory Visit: Payer: Self-pay

## 2018-09-20 ENCOUNTER — Encounter: Payer: Self-pay | Admitting: Podiatry

## 2018-09-20 VITALS — Temp 98.2°F

## 2018-09-20 DIAGNOSIS — M79676 Pain in unspecified toe(s): Secondary | ICD-10-CM

## 2018-09-20 DIAGNOSIS — D689 Coagulation defect, unspecified: Secondary | ICD-10-CM

## 2018-09-20 DIAGNOSIS — L608 Other nail disorders: Secondary | ICD-10-CM

## 2018-09-20 DIAGNOSIS — B351 Tinea unguium: Secondary | ICD-10-CM | POA: Diagnosis not present

## 2018-09-20 DIAGNOSIS — E119 Type 2 diabetes mellitus without complications: Secondary | ICD-10-CM | POA: Diagnosis not present

## 2018-09-20 NOTE — Progress Notes (Signed)
Complaint:  Visit Type: Patient returns to my office for continued preventative foot care services. Complaint: Patient states" my nails have grown long and thick and become painful to walk and wear shoes" Patient has been diagnosed with DM with no foot complications. The patient presents for preventative foot care services. No changes to ROS.  Patient is taking plavix.  Podiatric Exam: Vascular: dorsalis pedis and posterior tibial pulses are palpable bilateral. Capillary return is immediate. Temperature gradient is WNL. Skin turgor WNL  Sensorium: Normal Semmes Weinstein monofilament test. Normal tactile sensation bilaterally. Nail Exam: Pt has thick disfigured discolored nails with subungual debris noted bilateral entire nail hallux through fifth toenails Ulcer Exam: There is no evidence of ulcer or pre-ulcerative changes or infection. Orthopedic Exam: Muscle tone and strength are WNL. No limitations in general ROM. No crepitus or effusions noted. Foot type and digits show no abnormalities. Bony prominences are unremarkable. Skin: No Porokeratosis. No infection or ulcers  Diagnosis:  Onychomycosis, , Pain in right toe, pain in left toes  Treatment & Plan Procedures and Treatment: Consent by patient was obtained for treatment procedures. The patient understood the discussion of treatment and procedures well. All questions were answered thoroughly reviewed. Debridement of mycotic and hypertrophic toenails, 1 through 5 bilateral and clearing of subungual debris. No ulceration, no infection noted.  Return Visit-Office Procedure: Patient instructed to return to the office for a follow up visit 10 weeks  for continued evaluation and treatment.    Gardiner Barefoot DPM

## 2018-10-08 IMAGING — CT CT MAXILLOFACIAL W/O CM
5 of 9 series · 17 of 47 positions shown, 18 images · non-contrast
Comparison: None.

CLINICAL DATA: Syncope.  Closed head injury.

EXAM:
CT HEAD WITHOUT CONTRAST
CT MAXILLOFACIAL WITHOUT CONTRAST
CT CERVICAL SPINE WITHOUT CONTRAST
TECHNIQUE: Multidetector CT imaging of the head, cervical spine, and
maxillofacial structures were performed using the standard protocol
without intravenous contrast. Multiplanar CT image reconstructions
of the cervical spine and maxillofacial structures were also
generated.

[Series 2: head wo · axial · 0.41mm/px · z∈[-93,-48]mm · 2 of 28 slices shown, 3 images]
[im 10/28  brain]
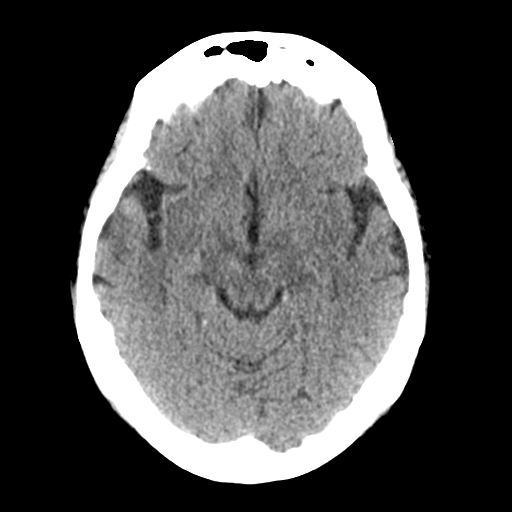
[im 10/28  bone]
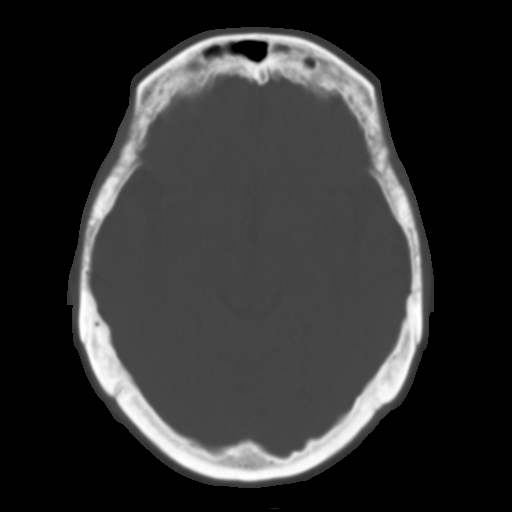
[im 19/28  bone]
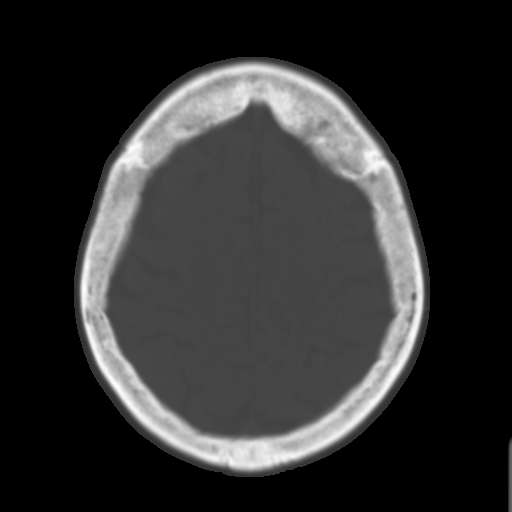

[Series 7: c spine soft · axial · 0.36mm/px · z∈[-279,-145]mm · 8 of 81 slices shown]
[im 7/81  brain]
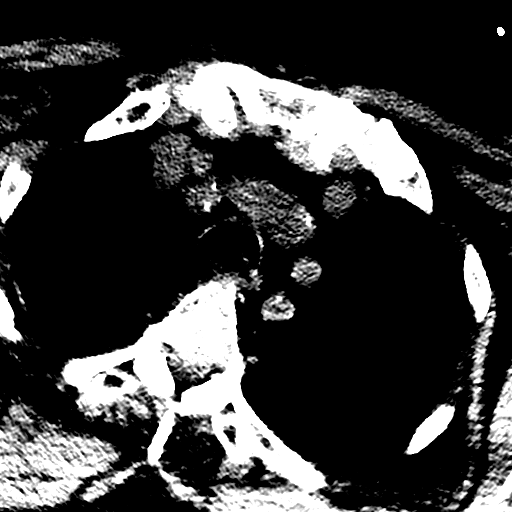
[im 21/81  brain]
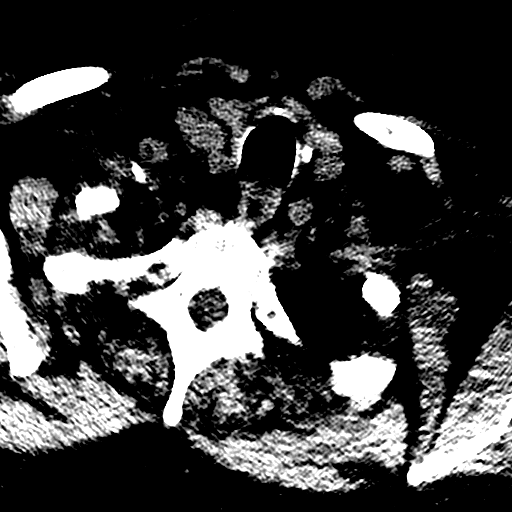
[im 27/81  brain]
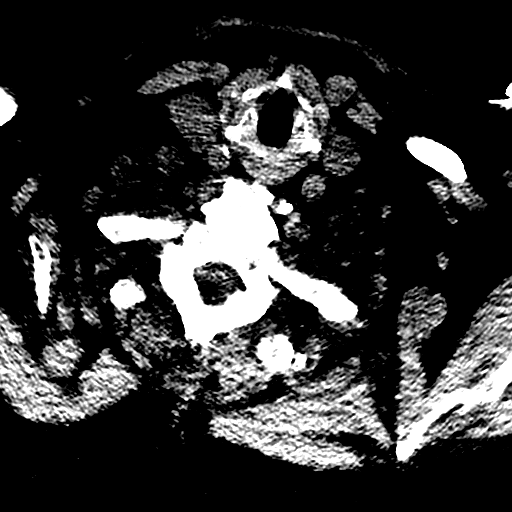
[im 34/81  brain]
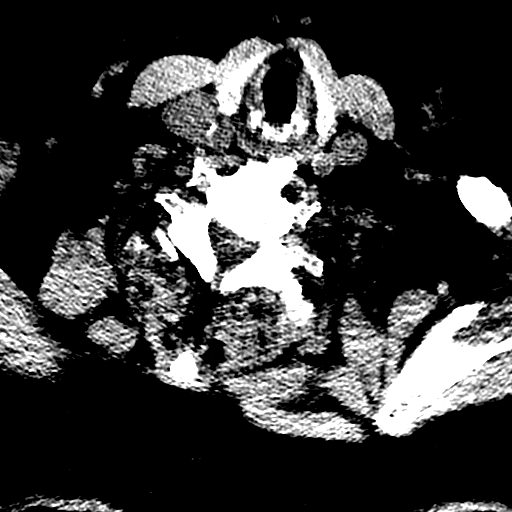
[im 47/81  brain]
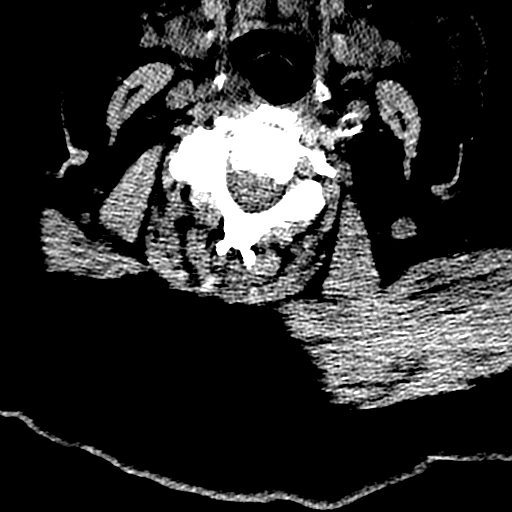
[im 54/81  brain]
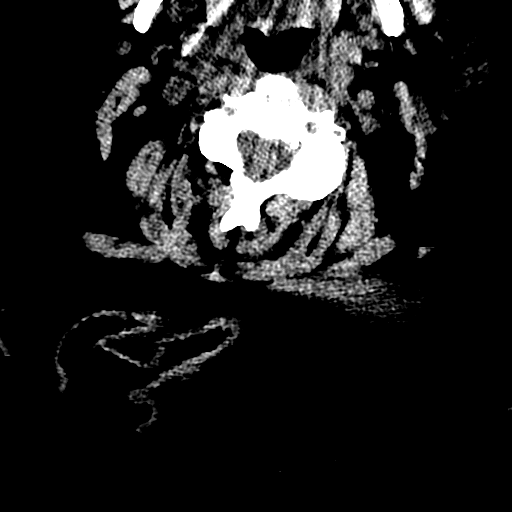
[im 61/81  brain]
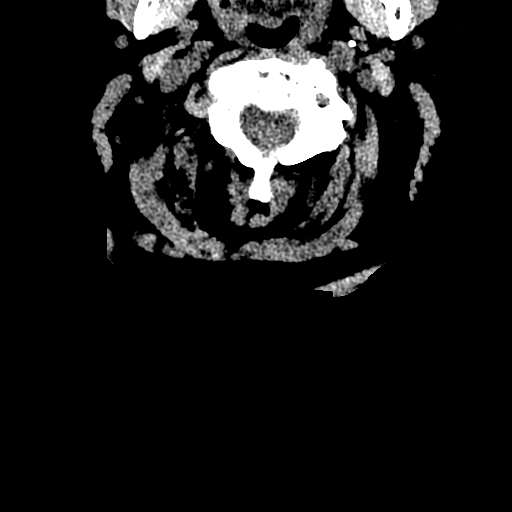
[im 74/81  brain]
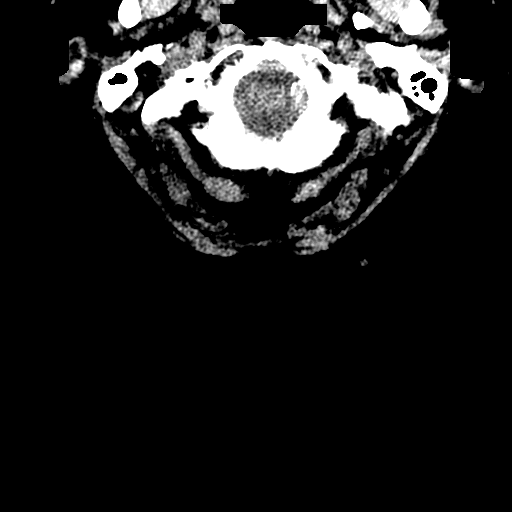

[Series 12: max soft · axial · 0.33mm/px · z∈[-230,-176]mm · 4 of 82 slices shown]
[im 7/82  brain]
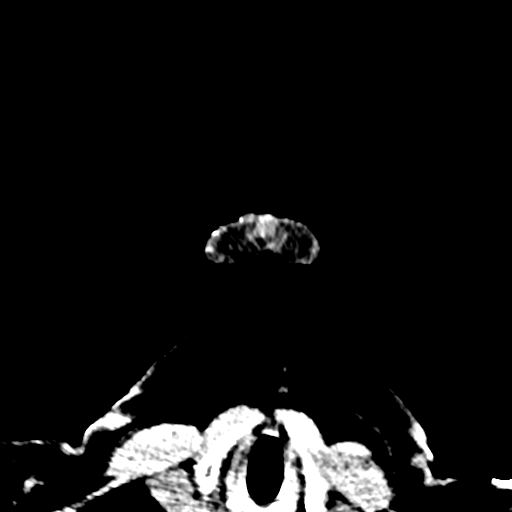
[im 21/82  brain]
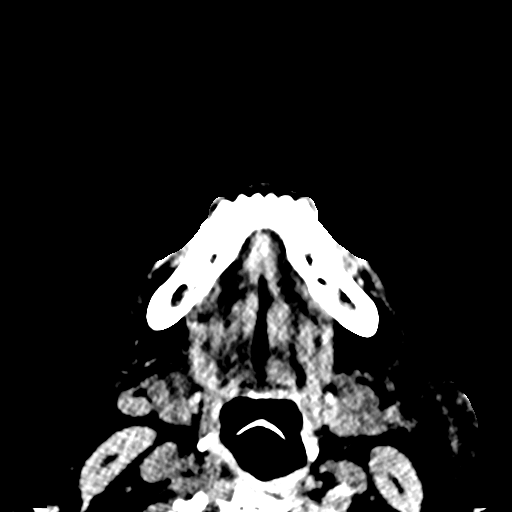
[im 28/82  brain]
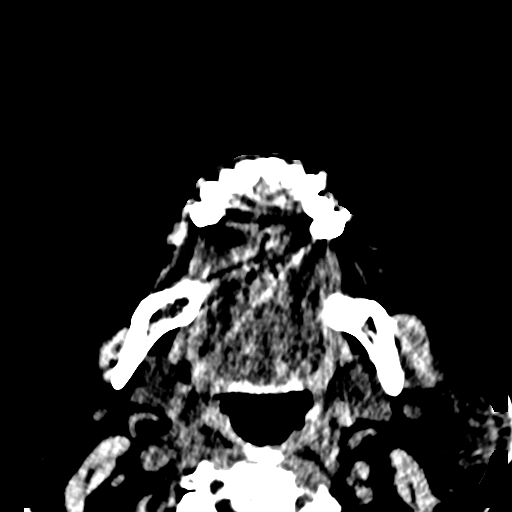
[im 34/82  brain]
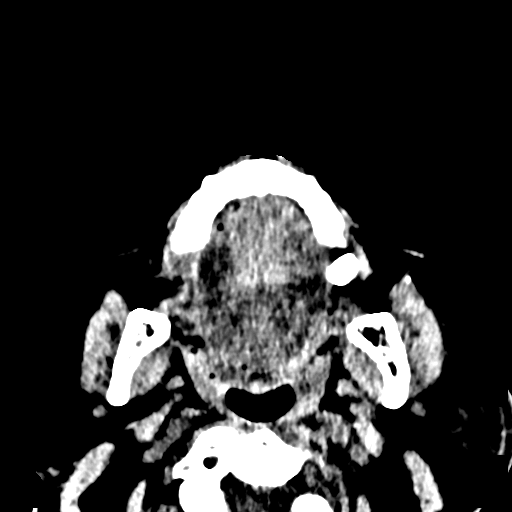

[Series 17: sagittal soft · sagittal · 0.26mm/px · 1 of 82 slices shown]
[im 41/82  bone]
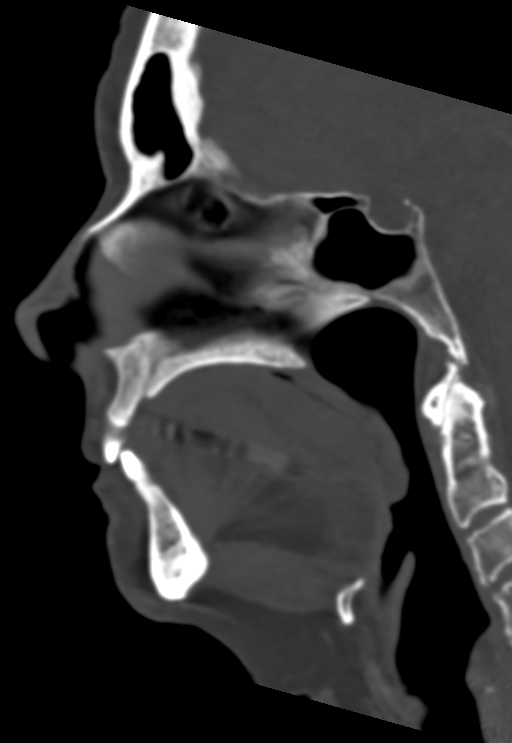

[Series 18: coronal bone · coronal · 0.35mm/px · 2 of 57 slices shown]
[im 19/57  bone]
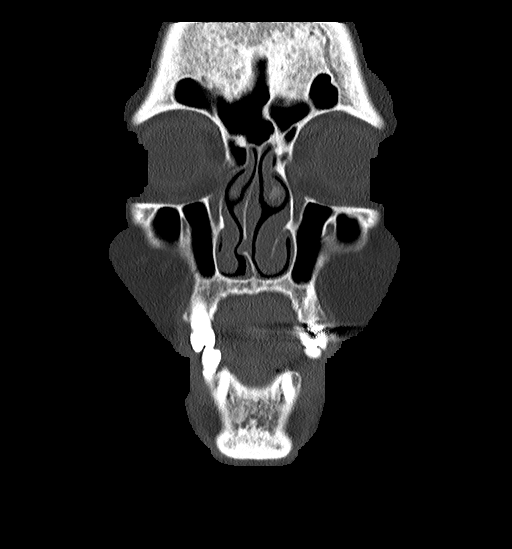
[im 38/57  bone]
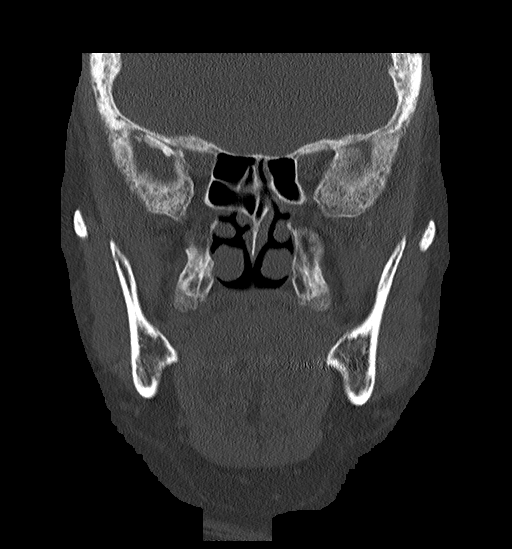

[17 of 47 positions shown; findings below may reference images not displayed]

FINDINGS: CT HEAD FINDINGS

Brain: No evidence of acute infarction, hemorrhage, hydrocephalus,
extra-axial collection or mass lesion/mass effect. Mild patchy areas
of low attenuation are identified within the subcortical and
periventricular white matter compatible with chronic small vessel
ischemic change.

Vascular: No hyperdense vessel or unexpected calcification.

Skull: Normal. Negative for fracture or focal lesion.

Other: None.

CT MAXILLOFACIAL FINDINGS

Osseous: No fracture or mandibular dislocation. No destructive
process.

Orbits: Negative. No traumatic or inflammatory finding.

Sinuses: Very mild mucosal thickening involving the dependent
portion of the left maxillary sinus, image 34 of series 18.

Soft tissues: Subcutaneous soft tissue stranding along the left side
of face is identified compatible with contusion.

CT CERVICAL SPINE FINDINGS

Alignment: There is normal alignment of the cervical spine.

Skull base and vertebrae: No acute fracture. No primary bone lesion
or focal pathologic process.

Soft tissues and spinal canal: No prevertebral fluid or swelling. No
visible canal hematoma.

Disc levels: Multi level disc space narrowing and ventral endplate
spurring is identified. This is most advanced at C6-7.

Upper chest: Negative.

Other: None
IMPRESSION: 1. No acute intracranial abnormalities. Small vessel ischemic
change.
2. Left facial contusion.
3. No evidence for facial bone fracture
4. No evidence for cervical spine fracture.
5. Cervical degenerative disc disease.

## 2018-10-11 ENCOUNTER — Telehealth: Payer: Self-pay

## 2018-10-11 NOTE — Telephone Encounter (Signed)
Telephone call form patient requesting telephonic visit on Wednesday rather than home visit.  Patient states her children do not want her to have visit's.  RN do complete telephonic visit on Wednesday at 10:00 AM.

## 2018-10-13 ENCOUNTER — Other Ambulatory Visit: Payer: Self-pay

## 2018-10-13 ENCOUNTER — Other Ambulatory Visit: Payer: Medicare Other

## 2018-10-13 DIAGNOSIS — Z515 Encounter for palliative care: Secondary | ICD-10-CM

## 2018-10-13 NOTE — Progress Notes (Signed)
COMMUNITY PALLIATIVE CARE SW NOTE  PATIENT NAME: Amy [name] DOB: [birth year] MRN: [id number]  PRIMARY CARE PROVIDER: [name], MD  RESPONSIBLE PARTY:  Acct ID - Guarantor Home Phone Work Phone Relationship Acct Type  [id number] Dominick Zertuche,* [phone]  Self P/F     [address], [place], [place] [address]     PLAN OF CARE and INTERVENTIONS:             1. GOALS OF CARE/ ADVANCE CARE PLANNING:  Patient said she is now a FULL CODE. Patient discussed advance care briefly and said "I think I want to leave it as it is". Discussion to continue. Goal is to remain at home.  2. SOCIAL/EMOTIONAL/SPIRITUAL ASSESSMENT/ INTERVENTIONS:  SW and RN completed TELEHEALTH visit with patient. Patient reports doing "pretty good". Patient was open, talkative with team. Patient lives alone, but daughter and granddaughter check-in often. Patient said she does have trouble breathing, feels strain on her vocal cords at times. Patient does have swelling in her left foot, MD adjusted lasix. RN reviewed. Patient is sleeping well, appetite is good. Denies any falls. Patient appreciates palliative care support and may be open to home visit next month.  3. PATIENT/CAREGIVER EDUCATION/ COPING:  Patient coping well, focused on being safe. Patient has very supportive family and enjoys their visits. 4. PERSONAL EMERGENCY PLAN:  Patient has life alert and family will call 9-1-1 for emergencies. Due to COVID-19, patient only goes out for doctor's appointments. 5. COMMUNITY RESOURCES COORDINATION/ HEALTH CARE NAVIGATION:  Patient has pulmonologist appointment on 9/1. Patient denies any care coordination concerns.  6. FINANCIAL/LEGAL CONCERNS/INTERVENTIONS:  None.     SOCIAL HX:  Social History   Tobacco Use  . Smoking status: Never Smoker  . Smokeless tobacco: Never Used  Substance Use Topics  . Alcohol use: No    CODE STATUS:   Code Status: Prior (FULL CODE) ADVANCED DIRECTIVES: N MOST FORM COMPLETE:   No. HOSPICE EDUCATION PROVIDED: None.  PPS: Patient is independent of ADLs.  Due to the COVID-19 crisis, this visit was done via telephone from my office and it was initiated and consent by this patient and/or family. This was a scheduled visit.   I spent 30 minutes with patient/family, from 10:00-10:30a providing education, support and consultation.    Margaretmary Lombard, LCSW

## 2018-10-13 NOTE — Progress Notes (Signed)
PATIENT NAME: Amy [name] DOB: [birth year] MRN: [id number]  PRIMARY CARE PROVIDER: [name], MD  RESPONSIBLE PARTY:  Acct ID - Guarantor Home Phone Work Phone Relationship Acct Type  [id number] Julee, Stoll [phone]  Self P/F     [address], [place], [place] [address]    PLAN OF CARE and INTERVENTIONS:               1.  GOALS OF CARE/ ADVANCE CARE PLANNING:  Remain in her home with daughter and grandchildren checking on patient frequently.               2.  PATIENT/CAREGIVER EDUCATION:  Education on fall precautions, education on s/s of infection, reviewed meds.               3.  DISEASE STATUS: Patient is a 85 year old patient with heart and lung disease.     HISTORY OF PRESENT ILLNESS:Due to the COVID-19 crisis, this visit was done via telephonic from my office and it was initiated and consent by this patient and or family.    SW and RN made telephonic telehealth visit with patient. Patient denies having any pain at the present time. Patient reports she had a wellness check with Dr Edwina Barth on 10-08-18. Patients current weight  176.9 lb. Patient reports MD decreased thyroid medication to 88 micrograms from a hundred micrograms.  Patient reports she also received B12 injection at wellness visit.  Patient denies suffering any recent falls.  Patient reports MD wants her to see new pulmonologist on September 1st and appointment has been scheduled.  Patient continues to have shortness of breath when she walks however O2 SATs remain normal. Patient reports cardiologist Dr Lauretta Grill feels like this is due to her weakened heart. Patient continues to receive injections in her eyes every 6 to 7 weeks for macular degeneration. Patient reports she has a dry non productive cough which she has suffered with for a long time. Patient reports her appetite is adequate. Patient reports she has swelling in left lower extremity. MD instructed her to take 20 mg of Lasix every other day. Patient  states her daughter remains supportive and brings supplies and other needed items to home. Patient's granddaughter who lives in front of patient also checks on patient frequently. Patient denies having any needs at the present time. Patient has [facility] in place. Patient encouraged to contact palliative care team with questions or concerns.   CODE STATUS: DNR  ADVANCED DIRECTIVES: Y MOST FORM: Y PPS: 50%   PHYSICAL EXAM:   VITALS: No vital signs taken telephonic visit   LUNGS: Feels breathless with minimal exertion.      CARDIAC:  EXTREMITIES: Left foot has swelling Lasix 20 mg every other day SKIN: Skin color, texture, turgor normal. No rashes or lesions  NEURO:  positive for gait problems and weakness       [name], RN

## 2018-11-08 ENCOUNTER — Telehealth: Payer: Self-pay

## 2018-11-08 NOTE — Telephone Encounter (Signed)
Telephone call to patient to schedule TELEHEALTH visit with patient. Patient in agreement with TELEHEALTH visit on 11-10-18 at 9:30 AM.

## 2018-11-10 ENCOUNTER — Other Ambulatory Visit: Payer: Medicare Other

## 2018-11-10 ENCOUNTER — Other Ambulatory Visit: Payer: Self-pay

## 2018-11-10 DIAGNOSIS — Z515 Encounter for palliative care: Secondary | ICD-10-CM

## 2018-11-10 NOTE — Progress Notes (Signed)
COMMUNITY PALLIATIVE CARE SW NOTE  PATIENT NAME: Amy [name] DOB: 11/15/1927 MRN: [id number]  PRIMARY CARE PROVIDER: [name], MD  RESPONSIBLE PARTY:  Acct ID - Guarantor Home Phone Work Phone Relationship Acct Type  [id number] Amy [name],* [phone]  Self P/F     [address], [place], [place] [address]     PLAN OF CARE and INTERVENTIONS:             1. GOALS OF CARE/ ADVANCE CARE PLANNING:  Patient is a FULL CODE. Patient's goal is to remain at home.  2. SOCIAL/EMOTIONAL/SPIRITUAL ASSESSMENT/ INTERVENTIONS:  SW completed TELEHEALTH visit with patient. Patient is doing "good". Patient denies pain. Patient said she continues to have difficulty with SOB, especially with walking. Patient saw her pulmonologist earlier this month and was started on nebulizer, but patient says she has not noticed a difference. Patient notes good appetite. Patient said she is not sleeping well, and wakes every few hours and to go to the bathroom. Patient reports that she slipped out of a chair last weekend, family was present and helped to get her up. Patient said she was not injured. Discussed safety precautions. Patient reports that she spent time with her family this weekend, and enjoyed it.   3. PATIENT/CAREGIVER EDUCATION/ COPING:  Patient was alert, oriented. Patient reports that she is coping well. Patient has supportive family. No concerns.  4. PERSONAL EMERGENCY PLAN:  Patient has life alert and family will call 9-1-1 for emergencies. Due to COVID-19, patient only goes out for doctor's appointments. 5. COMMUNITY RESOURCES COORDINATION/ HEALTH CARE NAVIGATION:  Patient is interested in a transport chair due to difficulty getting out to her appointments and walking longer distances, SW to follow-up. Patient said she received a letter that pulmonary rehab will be starting again soon ans she is hopeful to go back twice a week but is waiting for them to call and confirm. Per patient, she is also  being referred to nephrology due to recent blood work and lack of control of her bladder. Patient coordinates her appointments, no concerns. 6. FINANCIAL/LEGAL CONCERNS/INTERVENTIONS:  None.     SOCIAL HX:  Social History   Tobacco Use  . Smoking status: Never Smoker  . Smokeless tobacco: Never Used  Substance Use Topics  . Alcohol use: No    CODE STATUS:   Code Status: Prior (FULL CODE) ADVANCED DIRECTIVES: N MOST FORM COMPLETE:  No. HOSPICE EDUCATION PROVIDED: None.  PPS: Patient is independent of ADLs. Patient uses her walker or a wheelchair when she goes out of the home.   Due to the COVID-19 crisis, this visit was done viatelephonefrom my office and it was initiated and consent by this patient and/or family.This was a scheduled visit.  I spent7minutes with patient/family, from 9:30-10:00aproviding education, support and consultation.  Amy Lombard, LCSW

## 2018-11-23 DIAGNOSIS — N184 Chronic kidney disease, stage 4 (severe): Secondary | ICD-10-CM | POA: Insufficient documentation

## 2018-11-30 ENCOUNTER — Other Ambulatory Visit: Payer: Self-pay | Admitting: Nephrology

## 2018-11-30 DIAGNOSIS — N184 Chronic kidney disease, stage 4 (severe): Secondary | ICD-10-CM

## 2018-12-02 ENCOUNTER — Telehealth: Payer: Self-pay

## 2018-12-02 NOTE — Telephone Encounter (Signed)
Telephone call to patient to schedule palliatuve care visit.  Patient in agreement with palliatuve care team making visit on Friday 12-03-18 at 10:00 AM.

## 2018-12-03 ENCOUNTER — Other Ambulatory Visit: Payer: Self-pay

## 2018-12-03 ENCOUNTER — Other Ambulatory Visit: Payer: Medicare Other

## 2018-12-03 ENCOUNTER — Other Ambulatory Visit: Payer: Medicare Other | Admitting: Adult Health Nurse Practitioner

## 2018-12-03 DIAGNOSIS — Z515 Encounter for palliative care: Secondary | ICD-10-CM

## 2018-12-03 NOTE — Progress Notes (Signed)
PATIENT NAME: Amy [name] DOB: [birth year] MRN: [id number]  PRIMARY CARE PROVIDER: [name], MD  RESPONSIBLE PARTY:  Acct ID - Guarantor Home Phone Work Phone Relationship Acct Type  [id number] Marigene Erler,* [phone]  Self P/F     [address], [place], [place] [address]    PLAN OF CARE and INTERVENTIONS:               1.  GOALS OF CARE/ ADVANCE CARE PLANNING: Remain at home with family checking on patient.                2.  PATIENT/CAREGIVER EDUCATION: Education on fall precautions, education on s/s of infection, reviewed meds, support               4. PERSONAL EMERGENCY PLAN:  Patient has [facility] in place.                5.  DISEASE STATUS: SW an RN made home visit today.  Patient greets palliatiuve care team at door.   Patient reports she is staying home unless she has to go to the doctor. Patient states family did have one get together since covid-19  for birthday celebrations. Patient denies having any pain at the present time.  Patient unable to ambulate very far due to shortness of breath. Patient reports pulmonary MD started her on nebulizer albuterol treatments. Patient also reports her blood pressure medicine was increased from bid to tid. Patient reports she has had a 10 lb weight gain and current weight is 179 lb. Patient takes furosemide every other day for edema. Patient remains weak and tires easily. Patient also started on magnesium 400 mg daily. Patient continues to receive iron infusion at the [facility]. Patient reports her appetite is good and that she prefers sweets and fruits. Patient has no open areas of skin breakdown. Patient states she is to receive her flu shot on Thursday. Patients blood pressure remains elevated 178 / 62 today. Patient's heart rate is irregular. Patient in agreement with completing MOST form. Social Civil engineer, contracting patient with filling out MOST form on RN's  computer. Patient has 1+ edema in her lower extremities left greater than  the right. Information given on patient for Library contact to have books delivered to the home as patient reports she enjoys reading. Patient remains in agreement with palliative care services. Patient encourage to contact palliative care team with questions or concerns.    HISTORY OF PRESENT ILLNESS:  NESS:  Patient is a 85 year old with heart failure, A fib,Hyperlipidemia, Iron deficiency anemia and other cormorbidities . Patient is followed by pallaiive care and checked on monthly and prn      CODE STATUS: No Code  ADVANCED DIRECTIVES: Y MOST FORM: Sw begin filling out MOST form with patient today.  PPS: 40%   PHYSICAL EXAM:   VITALS: See vital signs  LUNGS: decreased breath sounds CARDIAC: Cor irreg, irreg RRR  EXTREMITIES: 1+ edema SKIN: Skin color, texture, turgor normal. No rashes or lesions  NEURO: positive for headaches and weakness       [name], RN

## 2018-12-03 NOTE — Progress Notes (Signed)
[facility] Consult Note Telephone: [phone]  Fax: [phone]  PATIENT NAME: Amy [name] DOB: 28-Aug-1927 MRN: [id number]  PRIMARY CARE PROVIDER:   [name], MD  REFERRING PROVIDER:  [name], MD [place],  [place] [address]  RESPONSIBLE PARTY:   Self [phone]  Due to the COVID-19 crisis, this visit was done via telemedicine and it was initiated and consent by this patient and or family. Video-audio (telehealth) contact was unable to be done due to technical barriers from the patient's side.    RECOMMENDATIONS and PLAN:  1.  Advanced care planning.  This visit was to go over and confirm patient's choices on MOST form.  Attempted to answer any questions.  She expressed understanding with her wishes for DNR, though has some reservations wished to keep it as a DNR , limited hospital interventions, wants antibiotics and IV fluids as indicated, and feeding tube for a trial period.  Finished signing off on MOST form in [place] that was started by Therapist, sports and SW.  Filled out DNR, uploaded to [facility], and sent signed copies of both MOST and DNR to patient to leave in the home.  I spent 25 minutes providing this consultation,  from 2:15 to 2:40. More than 50% of the time in this consultation was spent coordinating communication.   HISTORY OF PRESENT ILLNESS:  Amy [name] is a 85 y.o. year old female with multiple medical problems including CAD, ovarian cancer, CHF, CKD stage 3, DMT2, HTN, iron deficiency anemia, hypothyroidism. Palliative Care was asked to help address goals of care.   CODE STATUS: DNR  PPS: 0% HOSPICE ELIGIBILITY/DIAGNOSIS: TBD  PAST MEDICAL HISTORY:  Past Medical History:  Diagnosis Date  . Bladder incontinence   . CAD (coronary artery disease)    a. 05/2009 Cath: LAD 90p (Xience 2.75 x 12 mm DES), 65m, D1 40, LCx 40p/m, RCA 30/40/30p/m, RCA 30/25d;  b.  12/2011 Lexiscan MV: no  ischemia, breast attenuation artifact, normal EF-->Low risk; c. 10/2016 MV: fixed apical defect, most likely apical thinning and attenuation, No ischemia, EF 60%.  . Cancer ([place])    ovarian  . Carotid arterial disease w/ R Carotid Bruit (HCC)    a. 08/2016 Carotid U/S: 40-59% bilat ICA stenosis - f/u 1 yr.  . Chronic diastolic (congestive) heart failure ([place])    a. Echo 09/2015: EF 55-60% w/ Grade 1 DD, sev Ca2+ MV annulus, mildly dil LA; b. 09/2016 Echo: EF 65-70%, Gr2 DD, mildly dil LA/RA, nl RV fxn.  . Chronic Dyspnea on exertion   . CKD (chronic kidney disease) stage 3, GFR 30-59 ml/min (HCC)   . Degenerative arthritis of knee    bilateral knees  . Diabetes mellitus    Type II  . GIB (gastrointestinal bleeding)    a. 08/2016 Admission w/ presyncope/anemia/melena-->req Transfusion-->endo ok, colonoscopy w/ polyps but no source of bleeding (most likely diverticular).  . Hiatal hernia   . Hypertension   . Iron deficiency   . Menopausal symptoms   . Morbid obesity ([place])   . Renal insufficiency   . Thyroid disease    hypothyroidism    SOCIAL HX:  Social History   Tobacco Use  . Smoking status: Never Smoker  . Smokeless tobacco: Never Used  Substance Use Topics  . Alcohol use: No    ALLERGIES:  Allergies  Allergen Reactions  . Losartan Other (See Comments)    Hyperkalemia  . Morphine And Related  Shortness Of Breath    Rash, difficulty breathing, nausea.   . Atenolol Other (See Comments)    Other reaction(s): Other (See Comments) Decreased heart rate Decreased heart rate  . Codeine     Rash, difficulty breathing, nausea.  . Nsaids Other (See Comments)    Other reaction(s): Unknown  . Rofecoxib     Other reaction(s): Unknown  . Sucralfate Other (See Comments)    Throat tightness  . Sulfa Antibiotics     Other reaction(s): Unknown     PERTINENT MEDICATIONS:  Outpatient Encounter Medications as of 12/03/2018  Medication Sig  . albuterol (PROVENTIL HFA;VENTOLIN HFA)  108 (90 Base) MCG/ACT inhaler Inhale 2 puffs into the lungs every 6 (six) hours as needed for wheezing or shortness of breath.  . cloNIDine (CATAPRES - DOSED IN MG/24 HR) 0.2 mg/24hr patch Place 1 patch (0.2 mg total) onto the skin once a week.  . clopidogrel (PLAVIX) 75 MG tablet Take 1 tablet (75 mg total) by mouth daily.  Marland Kitchen docusate sodium (COLACE) 100 MG capsule Take 100 mg by mouth daily as needed for mild constipation. Takes daily  . doxazosin (CARDURA) 8 MG tablet   . erythromycin ophthalmic ointment   . furosemide (LASIX) 20 MG tablet Take 1 tablet (20 mg) by mouth once daily as needed for weight gain/ shortness of breath/ swelling  . hydrALAZINE (APRESOLINE) 25 MG tablet Take 1 tablet (25 mg) by mouth two times a day.  . ipratropium-albuterol (DUONEB) 0.5-2.5 (3) MG/3ML SOLN Take 3 mLs by nebulization every 4 (four) hours as needed (shortness of breath/wheezing.).  Marland Kitchen levothyroxine (SYNTHROID, LEVOTHROID) 125 MCG tablet Take 125 mcg by mouth daily.  Marland Kitchen lisinopril (PRINIVIL,ZESTRIL) 10 MG tablet Take 1 tablet (10 mg total) by mouth daily.  . metFORMIN (GLUCOPHAGE) 1000 MG tablet Take 1,000 mg by mouth 2 (two) times daily with a meal.  . oxybutynin (DITROPAN-XL) 5 MG 24 hr tablet Take 5 mg by mouth daily.   . pantoprazole (PROTONIX) 20 MG tablet Take 20 mg by mouth daily.  . simvastatin (ZOCOR) 40 MG tablet Take 1 tablet (40 mg total) by mouth at bedtime.  . SYMBICORT 160-4.5 MCG/ACT inhaler Inhale 2 puffs into the lungs 2 (two) times daily.    Facility-Administered Encounter Medications as of 12/03/2018  Medication  . 0.9 %  sodium chloride infusion  . ferumoxytol (FERAHEME) 510 mg in sodium chloride 0.9 % 100 mL IVPB     Maksymilian Mabey Jenetta Downer, NP

## 2018-12-03 NOTE — Progress Notes (Signed)
COMMUNITY PALLIATIVE CARE SW NOTE  PATIENT NAME: Amy [name] DOB: Apr 05, 1927 MRN: [id number]  PRIMARY CARE PROVIDER: [name], MD  RESPONSIBLE PARTY:  Acct ID - Guarantor Home Phone Work Phone Relationship Acct Type  [id number] Tandrea Kommer,* [phone]  Self P/F     [address], [place], [place] [address]     PLAN OF CARE and INTERVENTIONS:             1. GOALS OF CARE/ ADVANCE CARE PLANNING:  Patient is now a DNR. Needs for mailed to the home. SW and RN started MOST form with patient. To be reviewed with NP. SW notified NP. Patient's goal is to remain at home.  2. SOCIAL/EMOTIONAL/SPIRITUAL ASSESSMENT/ INTERVENTIONS:  SW and RN met with patient in her home. Patient reports doing "good". Patient denies pain. Patient does note headache today. Patient said she is eating well especially sweets and fruits. Patient noted she has gained weight. Patient reports that she does not sleep well due to getting up to go to the bathroom. Patient talked about her family and great grandchildren. Patient said they had a family gathering to celebrate her son and daughter's birthdays. Patient spends most of her time watching televison or reading. Patient would like to have [employer] deliver books to her. SW provided contact information for the [employer] program at the [facility]. Patient was appreciative. 3. PATIENT/CAREGIVER EDUCATION/ COPING:  Patient was alert, engaged in visit. Patient said she is coping well. Patient has local, supportive family. Denies concerns. 4. PERSONAL EMERGENCY PLAN:  Patient has life alert and family will call 9-1-1 for emergencies. Due to COVID-19, patient only goes out for doctor's appointments. 5. COMMUNITY RESOURCES COORDINATION/ HEALTH CARE NAVIGATION:  Patient coordinates her appointments. Patient is scheduled to get her flu shot next week. Patient is hopeful that cardiopulmonary rehab will start back soon, SW encouraged patient to call program staff  and discuss.  6. FINANCIAL/LEGAL CONCERNS/INTERVENTIONS:  None.     SOCIAL HX:  Social History   Tobacco Use  . Smoking status: Never Smoker  . Smokeless tobacco: Never Used  Substance Use Topics  . Alcohol use: No    CODE STATUS:   Code Status: Prior (DNR) ADVANCED DIRECTIVES: Y MOST FORM COMPLETE:  In progress. HOSPICE EDUCATION PROVIDED: None.  PPS: Patient is independent of ADLs. Patient uses her walker or a wheelchair when she goes out of the home.   I spent42mnutes with patient/family, from 10:00-11:00aproviding education, support and consultation.   WMargaretmary Lombard LCSW

## 2018-12-06 ENCOUNTER — Ambulatory Visit: Payer: Medicare Other

## 2018-12-06 ENCOUNTER — Ambulatory Visit: Payer: Medicare Other | Admitting: Oncology

## 2018-12-06 ENCOUNTER — Other Ambulatory Visit: Payer: Medicare Other

## 2018-12-06 ENCOUNTER — Other Ambulatory Visit: Payer: Self-pay | Admitting: Internal Medicine

## 2018-12-06 DIAGNOSIS — Z1231 Encounter for screening mammogram for malignant neoplasm of breast: Secondary | ICD-10-CM

## 2018-12-09 NOTE — Progress Notes (Signed)
[place]  [phone] [phone]  ID: Amy [name] Swab OB: 1927-04-11  MR#: [id number]  QPY#:[id number]  Patient Care Team: [name], MD as PCP - General (Internal Medicine) Rockey Situ Kathlene November, MD as PCP - Cardiology (Cardiology) Minna Merritts, MD as Consulting Physician (Cardiology)  CHIEF COMPLAINT: Iron deficiency anemia secondary to chronic blood loss.  INTERVAL HISTORY: Patient returns to clinic today for repeat laboratory work, further evaluation, and continuation of Feraheme.  She has chronic weakness and fatigue, but otherwise feels well.  She has not noted any further GI bleeds or changes in her bowel movements. She has no neurologic complaints. She denies any recent fevers or illnesses. She denies any chest pain, cough, or hemoptysis.  She reports continued chronic shortness of breath.  She denies any nausea, vomiting, constipation, or diarrhea.  She has no melena or hematochezia.  She has no urinary complaints.  Patient offers no further specific complaints today.  REVIEW OF SYSTEMS:   Review of Systems  Constitutional: Positive for malaise/fatigue. Negative for fever and weight loss.  HENT: Negative.  Negative for sore throat.   Respiratory: Positive for shortness of breath. Negative for cough, hemoptysis and stridor.   Cardiovascular: Negative.  Negative for chest pain and leg swelling.  Gastrointestinal: Negative.  Negative for abdominal pain, blood in stool and melena.  Genitourinary: Negative.  Negative for hematuria.  Musculoskeletal: Negative.  Negative for back pain.  Skin: Negative.  Negative for rash.  Neurological: Positive for weakness. Negative for sensory change, focal weakness and headaches.  Psychiatric/Behavioral: Negative.  The patient is not nervous/anxious.     As per HPI. Otherwise, a complete review of systems is negative.  PAST MEDICAL HISTORY: Past Medical History:  Diagnosis Date   Bladder  incontinence    CAD (coronary artery disease)    a. 05/2009 Cath: LAD 90p (Xience 2.75 x 12 mm DES), 19m, D1 40, LCx 40p/m, RCA 30/40/30p/m, RCA 30/25d;  b.  12/2011 Lexiscan MV: no ischemia, breast attenuation artifact, normal EF-->Low risk; c. 10/2016 MV: fixed apical defect, most likely apical thinning and attenuation, No ischemia, EF 60%.   Cancer [facility])    ovarian   Carotid arterial disease w/ R Carotid Bruit (HCC)    a. 08/2016 Carotid U/S: 40-59% bilat ICA stenosis - f/u 1 yr.   Chronic diastolic (congestive) heart failure ([place])    a. Echo 09/2015: EF 55-60% w/ Grade 1 DD, sev Ca2+ MV annulus, mildly dil LA; b. 09/2016 Echo: EF 65-70%, Gr2 DD, mildly dil LA/RA, nl RV fxn.   Chronic Dyspnea on exertion    CKD (chronic kidney disease) stage 3, GFR 30-59 ml/min (HCC)    Degenerative arthritis of knee    bilateral knees   Diabetes mellitus    Type II   GIB (gastrointestinal bleeding)    a. 08/2016 Admission w/ presyncope/anemia/melena-->req Transfusion-->endo ok, colonoscopy w/ polyps but no source of bleeding (most likely diverticular).   Hiatal hernia    Hypertension    Iron deficiency    Menopausal symptoms    Morbid obesity ([place])    Renal insufficiency    Thyroid disease    hypothyroidism    PAST SURGICAL HISTORY: Past Surgical History:  Procedure Laterality Date   COLONOSCOPY  2015   COLONOSCOPY WITH PROPOFOL N/A 09/25/2016   Procedure: COLONOSCOPY WITH PROPOFOL;  Surgeon: [name], MD;  Location: [facility] ENDOSCOPY;  Service: Gastroenterology;  Laterality: N/A;   COLONOSCOPY WITH PROPOFOL N/A 09/26/2016   Procedure: COLONOSCOPY  WITH PROPOFOL;  Surgeon: [name], MD;  Location: [facility] ENDOSCOPY;  Service: Gastroenterology;  Laterality: N/A;   ESOPHAGOGASTRODUODENOSCOPY (EGD) WITH PROPOFOL N/A 09/25/2016   Procedure: ESOPHAGOGASTRODUODENOSCOPY (EGD) WITH PROPOFOL;  Surgeon: [name], MD;  Location: [facility] ENDOSCOPY;  Service: Gastroenterology;  Laterality: N/A;    REPLACEMENT TOTAL KNEE BILATERAL     TOTAL VAGINAL HYSTERECTOMY     ovarian mass, not cancerous   UPPER GI ENDOSCOPY  2015    FAMILY HISTORY Family History  Problem Relation Age of Onset   Breast cancer Mother 71       ADVANCED DIRECTIVES:    HEALTH MAINTENANCE: Social History   Tobacco Use   Smoking status: Never Smoker   Smokeless tobacco: Never Used  Substance Use Topics   Alcohol use: No   Drug use: No     Colonoscopy:  PAP:  Bone density:  Lipid panel:  Allergies  Allergen Reactions   Losartan Other (See Comments)    Hyperkalemia   Morphine And Related Shortness Of Breath    Rash, difficulty breathing, nausea.    Atenolol Other (See Comments)    Other reaction(s): Other (See Comments) Decreased heart rate Decreased heart rate   Codeine     Rash, difficulty breathing, nausea.   Nsaids Other (See Comments)    Other reaction(s): Unknown   Rofecoxib     Other reaction(s): Unknown   Sucralfate Other (See Comments)    Throat tightness   Sulfa Antibiotics     Other reaction(s): Unknown    Current Outpatient Medications  Medication Sig Dispense Refill   albuterol (PROVENTIL HFA;VENTOLIN HFA) 108 (90 Base) MCG/ACT inhaler Inhale 2 puffs into the lungs every 6 (six) hours as needed for wheezing or shortness of breath.     cloNIDine (CATAPRES - DOSED IN MG/24 HR) 0.2 mg/24hr patch Place 1 patch (0.2 mg total) onto the skin once a week. 4 patch 12   clopidogrel (PLAVIX) 75 MG tablet Take 1 tablet (75 mg total) by mouth daily. 90 tablet 3   docusate sodium (COLACE) 100 MG capsule Take 100 mg by mouth daily as needed for mild constipation. Takes daily     doxazosin (CARDURA) 8 MG tablet      erythromycin ophthalmic ointment      furosemide (LASIX) 20 MG tablet Take 1 tablet (20 mg) by mouth once daily as needed for weight gain/ shortness of breath/ swelling     hydrALAZINE (APRESOLINE) 25 MG tablet Take 1 tablet (25 mg) by mouth two times  a day. 180 tablet 2   ipratropium-albuterol (DUONEB) 0.5-2.5 (3) MG/3ML SOLN Take 3 mLs by nebulization every 4 (four) hours as needed (shortness of breath/wheezing.). 360 mL    levothyroxine (SYNTHROID, LEVOTHROID) 125 MCG tablet Take 125 mcg by mouth daily.     lisinopril (PRINIVIL,ZESTRIL) 10 MG tablet Take 1 tablet (10 mg total) by mouth daily. 90 tablet 3   metFORMIN (GLUCOPHAGE) 1000 MG tablet Take 1,000 mg by mouth 2 (two) times daily with a meal.     oxybutynin (DITROPAN-XL) 5 MG 24 hr tablet Take 5 mg by mouth daily.      pantoprazole (PROTONIX) 20 MG tablet Take 20 mg by mouth daily.     simvastatin (ZOCOR) 40 MG tablet Take 1 tablet (40 mg total) by mouth at bedtime. 90 tablet 2   SYMBICORT 160-4.5 MCG/ACT inhaler Inhale 2 puffs into the lungs 2 (two) times daily.      No current facility-administered medications for this visit.  Facility-Administered Medications Ordered in Other Visits  Medication Dose Route Frequency Provider Last Rate Last Dose   0.9 %  sodium chloride infusion   Intravenous Continuous Lloyd Huger, MD        OBJECTIVE: Vitals:   12/13/18 1056  BP: (!) 190/40  Pulse: 65  Resp: 20  Temp: (!) 96.5 F (35.8 C)     Body mass index is 33.71 kg/m.    ECOG FS:2 - Symptomatic, <50% confined to bed  General: Well-developed, well-nourished, no acute distress.  Sitting in a wheelchair. Eyes: Pink conjunctiva, anicteric sclera. HEENT: Normocephalic, moist mucous membranes. Lungs: Clear to auscultation bilaterally. Heart: Regular rate and rhythm. No rubs, murmurs, or gallops. Abdomen: Soft, nontender, nondistended. No organomegaly noted, normoactive bowel sounds. Musculoskeletal: No edema, cyanosis, or clubbing. Neuro: Alert, answering all questions appropriately. Cranial nerves grossly intact. Skin: No rashes or petechiae noted. Psych: Normal affect.  LAB RESULTS:  Lab Results  Component Value Date   NA 140 05/20/2017   K 4.1 05/20/2017    CL 103 05/20/2017   CO2 26 05/20/2017   GLUCOSE 146 (H) 05/20/2017   BUN 18 05/20/2017   CREATININE 1.32 (H) 05/20/2017   CALCIUM 7.8 (L) 05/20/2017   PROT 5.9 (L) 05/15/2017   ALBUMIN 2.9 (L) 05/15/2017   AST 26 05/15/2017   ALT 11 (L) 05/15/2017   ALKPHOS 70 05/15/2017   BILITOT 0.5 05/15/2017   GFRNONAA 35 (L) 05/20/2017   GFRAA 40 (L) 05/20/2017    Lab Results  Component Value Date   WBC 4.9 12/13/2018   NEUTROABS 3.1 12/13/2018   HGB 9.4 (L) 12/13/2018   HCT 28.8 (L) 12/13/2018   MCV 97.6 12/13/2018   PLT 165 12/13/2018   Lab Results  Component Value Date   IRON 76 12/13/2018   TIBC 208 (L) 12/13/2018   IRONPCTSAT 37 (H) 12/13/2018    Lab Results  Component Value Date   FERRITIN 195 12/13/2018    STUDIES: US Renal  Result Date: 12/10/2018 CLINICAL DATA:  Chronic renal disease EXAM: RENAL / URINARY TRACT ULTRASOUND COMPLETE COMPARISON:  None. FINDINGS: Right Kidney: Renal measurements: 10.4 x 5.0 x 5.0 cm = volume: 137.5 mL. There is a 2.7 cm cyst in the right kidney with a thin septation and no solid components. This is most consistent with a Bosniak 2 cyst. There is cortical thinning with the cortex measuring less than a cm. Left Kidney: Renal measurements: 10.2 x 4.7 x 4.7 cm = volume: 116.2 mL. There is an 11 mm stone in the upper pole of the left kidney. 2 cysts are identified with the largest measuring 3.4 cm. Bladder: The bladder is decompressed and not visualized. Other: None IMPRESSION: 1. Bilateral renal cysts. 2. 11 mm nonobstructive stone in the left kidney 3. Mild cortical thinning on the right measuring 9.4 mm. Cortical thickness is borderline on the left measuring 9 mm in the upper pole and 13 mm in the lower pole. Electronically Signed   By: Dorise Bullion III M.D   On: 12/10/2018 14:28    ASSESSMENT:  Iron deficiency anemia secondary to chronic blood loss, shortness of breath.   PLAN:    1. Iron deficiency anemia secondary to chronic blood  loss: Patient's hemoglobin remains decreased at 9.4, but essentially stable.  Patient's iron stores are within normal limits, but will proceed with 510 mg IV Feraheme today. Previously, patient was reported to be intolerant of oral iron supplementation.  Patient had colonoscopy and EGD in August 2018  which was unrevealing.  Given her mild renal insufficiency, she also may benefit from Retacrit.  Return to clinic in 3 months with repeat laboratory work and further evaluation.  2. Pulmonary nodules: Repeat CT scan from January 13, 2017 revealed stable pulmonary nodules.  No further imaging is necessary.   3. Renal insufficiency: Continue monitoring per primary care.  Consider Retacrit as above. 4.  Hypertension: Blood pressure is significantly elevated today.  Continue treatment and monitoring per primary care. 5.  History of GI bleed: Colonoscopy and EGD in August 2018.  Continue follow-up with GI as indicated.  Patient expressed understanding and was in agreement with this plan. She also understands that She can call clinic at any time with any questions, concerns, or complaints.    Lloyd Huger, MD   12/14/2018 6:29 AM

## 2018-12-10 ENCOUNTER — Ambulatory Visit
Admission: RE | Admit: 2018-12-10 | Discharge: 2018-12-10 | Disposition: A | Discharge status: Home / Self Care | Payer: Medicare Other | Source: Ambulatory Visit | Attending: Nephrology | Admitting: Nephrology

## 2018-12-10 ENCOUNTER — Other Ambulatory Visit: Payer: Self-pay

## 2018-12-10 DIAGNOSIS — N184 Chronic kidney disease, stage 4 (severe): Secondary | ICD-10-CM

## 2018-12-10 NOTE — Progress Notes (Signed)
Patient pre screened for office appointment, no questions or concerns today. 

## 2018-12-13 ENCOUNTER — Inpatient Hospital Stay: Payer: Medicare Other | Attending: Oncology

## 2018-12-13 ENCOUNTER — Inpatient Hospital Stay: Payer: Medicare Other

## 2018-12-13 ENCOUNTER — Inpatient Hospital Stay (HOSPITAL_BASED_OUTPATIENT_CLINIC_OR_DEPARTMENT_OTHER): Payer: Medicare Other | Admitting: Oncology

## 2018-12-13 ENCOUNTER — Other Ambulatory Visit: Payer: Self-pay

## 2018-12-13 VITALS — BP 190/40 | HR 65 | Temp 96.5°F | Resp 20 | Ht 62.0 in | Wt 184.3 lb

## 2018-12-13 VITALS — BP 169/70 | HR 70 | Temp 97.0°F | Resp 18

## 2018-12-13 DIAGNOSIS — Z79899 Other long term (current) drug therapy: Secondary | ICD-10-CM | POA: Insufficient documentation

## 2018-12-13 DIAGNOSIS — Z7951 Long term (current) use of inhaled steroids: Secondary | ICD-10-CM | POA: Diagnosis not present

## 2018-12-13 DIAGNOSIS — Z7984 Long term (current) use of oral hypoglycemic drugs: Secondary | ICD-10-CM | POA: Insufficient documentation

## 2018-12-13 DIAGNOSIS — R918 Other nonspecific abnormal finding of lung field: Secondary | ICD-10-CM | POA: Diagnosis not present

## 2018-12-13 DIAGNOSIS — D508 Other iron deficiency anemias: Secondary | ICD-10-CM | POA: Insufficient documentation

## 2018-12-13 DIAGNOSIS — D509 Iron deficiency anemia, unspecified: Secondary | ICD-10-CM | POA: Diagnosis not present

## 2018-12-13 DIAGNOSIS — Z803 Family history of malignant neoplasm of breast: Secondary | ICD-10-CM | POA: Diagnosis not present

## 2018-12-13 DIAGNOSIS — I10 Essential (primary) hypertension: Secondary | ICD-10-CM | POA: Diagnosis not present

## 2018-12-13 DIAGNOSIS — I25118 Atherosclerotic heart disease of native coronary artery with other forms of angina pectoris: Secondary | ICD-10-CM

## 2018-12-13 DIAGNOSIS — N189 Chronic kidney disease, unspecified: Secondary | ICD-10-CM | POA: Insufficient documentation

## 2018-12-13 DIAGNOSIS — E039 Hypothyroidism, unspecified: Secondary | ICD-10-CM | POA: Diagnosis not present

## 2018-12-13 DIAGNOSIS — D649 Anemia, unspecified: Secondary | ICD-10-CM

## 2018-12-13 DIAGNOSIS — D500 Iron deficiency anemia secondary to blood loss (chronic): Secondary | ICD-10-CM

## 2018-12-13 LAB — CBC WITH DIFFERENTIAL/PLATELET
Abs Immature Granulocytes: 0.01 10*3/uL (ref 0.00–0.07)
Basophils Absolute: 0 10*3/uL (ref 0.0–0.1)
Basophils Relative: 0 %
Eosinophils Absolute: 0.2 10*3/uL (ref 0.0–0.5)
Eosinophils Relative: 3 %
HCT: 28.8 % — ABNORMAL LOW (ref 36.0–46.0)
Hemoglobin: 9.4 g/dL — ABNORMAL LOW (ref 12.0–15.0)
Immature Granulocytes: 0 %
Lymphocytes Relative: 24 %
Lymphs Abs: 1.2 10*3/uL (ref 0.7–4.0)
MCH: 31.9 pg (ref 26.0–34.0)
MCHC: 32.6 g/dL (ref 30.0–36.0)
MCV: 97.6 fL (ref 80.0–100.0)
Monocytes Absolute: 0.5 10*3/uL (ref 0.1–1.0)
Monocytes Relative: 11 %
Neutro Abs: 3.1 10*3/uL (ref 1.7–7.7)
Neutrophils Relative %: 62 %
Platelets: 165 10*3/uL (ref 150–400)
RBC: 2.95 MIL/uL — ABNORMAL LOW (ref 3.87–5.11)
RDW: 12.6 % (ref 11.5–15.5)
WBC: 4.9 10*3/uL (ref 4.0–10.5)
nRBC: 0 % (ref 0.0–0.2)

## 2018-12-13 LAB — FERRITIN: Ferritin: 195 ng/mL (ref 11–307)

## 2018-12-13 LAB — IRON AND TIBC
Iron: 76 ug/dL (ref 28–170)
Saturation Ratios: 37 % — ABNORMAL HIGH (ref 10.4–31.8)
TIBC: 208 ug/dL — ABNORMAL LOW (ref 250–450)
UIBC: 132 ug/dL

## 2018-12-13 LAB — SAMPLE TO BLOOD BANK

## 2018-12-13 MED ORDER — SODIUM CHLORIDE 0.9 % IV SOLN
510.0000 mg | Freq: Once | INTRAVENOUS | Status: AC
  Administered 2018-12-13: 510 mg via INTRAVENOUS
  Filled 2018-12-13: qty 17

## 2018-12-13 MED ORDER — SODIUM CHLORIDE 0.9 % IV SOLN
Freq: Once | INTRAVENOUS | Status: AC
  Administered 2018-12-13: 12:00:00 via INTRAVENOUS
  Filled 2018-12-13: qty 250

## 2018-12-13 NOTE — Progress Notes (Signed)
Pt tolerated fereheme infusion well, refused to remain in clinic for 30 minute observation period post infusion.

## 2018-12-20 ENCOUNTER — Ambulatory Visit (INDEPENDENT_AMBULATORY_CARE_PROVIDER_SITE_OTHER): Payer: Medicare Other | Admitting: Podiatry

## 2018-12-20 ENCOUNTER — Encounter: Payer: Self-pay | Admitting: Podiatry

## 2018-12-20 ENCOUNTER — Other Ambulatory Visit: Payer: Self-pay

## 2018-12-20 DIAGNOSIS — E119 Type 2 diabetes mellitus without complications: Secondary | ICD-10-CM

## 2018-12-20 DIAGNOSIS — B351 Tinea unguium: Secondary | ICD-10-CM | POA: Diagnosis not present

## 2018-12-20 DIAGNOSIS — M79676 Pain in unspecified toe(s): Secondary | ICD-10-CM | POA: Diagnosis not present

## 2018-12-20 DIAGNOSIS — D689 Coagulation defect, unspecified: Secondary | ICD-10-CM

## 2018-12-20 DIAGNOSIS — L608 Other nail disorders: Secondary | ICD-10-CM

## 2018-12-20 NOTE — Progress Notes (Signed)
Complaint:  Visit Type: Patient returns to my office for continued preventative foot care services. Complaint: Patient states" my nails have grown long and thick and become painful to walk and wear shoes" Patient has been diagnosed with DM with no foot complications. The patient presents for preventative foot care services. No changes to ROS.  Patient is taking plavix.  Podiatric Exam: Vascular: dorsalis pedis and posterior tibial pulses are palpable bilateral. Capillary return is immediate. Temperature gradient is WNL. Skin turgor WNL  Sensorium: Normal Semmes Weinstein monofilament test. Normal tactile sensation bilaterally. Nail Exam: Pt has thick disfigured discolored nails with subungual debris noted bilateral entire nail hallux through fifth toenails Ulcer Exam: There is no evidence of ulcer or pre-ulcerative changes or infection. Orthopedic Exam: Muscle tone and strength are WNL. No limitations in general ROM. No crepitus or effusions noted. Foot type and digits show no abnormalities. Bony prominences are unremarkable. Skin: No Porokeratosis. No infection or ulcers  Diagnosis:  Onychomycosis, , Pain in right toe, pain in left toes  Treatment & Plan Procedures and Treatment: Consent by patient was obtained for treatment procedures. The patient understood the discussion of treatment and procedures well. All questions were answered thoroughly reviewed. Debridement of mycotic and hypertrophic toenails, 1 through 5 bilateral and clearing of subungual debris. No ulceration, no infection noted.  Return Visit-Office Procedure: Patient instructed to return to the office for a follow up visit 10 weeks  for continued evaluation and treatment.    Gardiner Barefoot DPM

## 2018-12-28 ENCOUNTER — Telehealth: Payer: Self-pay

## 2018-12-28 NOTE — Telephone Encounter (Signed)
Telephone call to patient to schedule palliauve care visit.  Patient requests telephonic visit on Friday 12-31-18 at 9:30 AM.

## 2018-12-29 DIAGNOSIS — N200 Calculus of kidney: Secondary | ICD-10-CM | POA: Insufficient documentation

## 2018-12-29 DIAGNOSIS — Q6102 Congenital multiple renal cysts: Secondary | ICD-10-CM | POA: Insufficient documentation

## 2018-12-31 ENCOUNTER — Other Ambulatory Visit: Payer: Self-pay

## 2018-12-31 ENCOUNTER — Other Ambulatory Visit: Payer: Medicare Other

## 2018-12-31 DIAGNOSIS — Z515 Encounter for palliative care: Secondary | ICD-10-CM

## 2018-12-31 NOTE — Progress Notes (Signed)
PATIENT NAME: Amy [name] DOB: 16-Feb-1928 MRN: [id number]  PRIMARY CARE PROVIDER: [name], MD  RESPONSIBLE PARTY:  Acct ID - Guarantor Home Phone Work Phone Relationship Acct Type  [id number] [name],* [phone]  Self P/F     [address], [place], [place] [address]    PLAN OF CARE and INTERVENTIONS:               1.  GOALS OF CARE/ ADVANCE CARE PLANNING:  Remain in her home with family checking on patient.  Patient would like to be able to get around the house better.               2.  PATIENT/CAREGIVER EDUCATION:  Education on fall precautions, education on s/s of infection, reviewed meds, support               3.  DISEASE STATUS: SW and RN completed telephonic visit with patient this morning. Patient had requested telephonic visit earlier this week. Patient reports she is feeling sleepy and weak. Patients saw  kidney MD on Tuesday and her blood pressure was 208/46.  MD increased patient hydralazine from 50 mg tid to 100 mg tid. Patient states she is feeling weaker than normal. Nurse offers home visit to patient and patient accepts. Nurse arrives at patients home.  Patient sitting on sofa. Patient states she has felt increased weakness Hydralazine was increased. Patient also has a kidney stone in left kidney and reports she has a cyst in her right kidney. Patient states she has been having some discomfort in her right lower back at kidney area. Patient remains breathless and has to hold onto things when she walks inside her home. Patient spends most of the time sitting on her sofa. Patient reports she also received injection for shingles and feels this could be causing her to feel worse. Patient states she feels short of breath  Everyday.  Patient reports her appetite remains good. Nurse calls Dr. Keturah Barre office and leaves message on nurse line. Nurse requests call back from MD's office.   Patients blood pressure today 204/? Diastolic pressure bottomed out when nurse checked  patients B/P. Patient alert and oriented. Patient is forgetful. Patient reports MD to schedule kidney ultrasound. Patient reports having increasing urinary incontinence and states she has to wear personal pad now due to urinary leakage. Support provided to patient. Patient informed when nurse heard back from MD's office nurse would call and update patient. Patient verbalized appreciation for home care visit.  Patient encouraged to call palliative care with questions or concerns.        HISTORY OF PRESENT ILLNESS:  Patient is a 85 year old female who resides in her own home.  Family checks on patient during the day.  Patient visited monthly and prn by PMPM team.  CODE STATUS: DNR  ADVANCED DIRECTIVES: Y MOST FORM: Y PPS: 40%   PHYSICAL EXAM:   VITALS: Today's Vitals   12/31/18 0913  BP: (!) 204/0  Pulse: (!) 53  Resp: 20  Temp: 97.8 F (36.6 C)  TempSrc: Temporal  SpO2: 97%  Weight: 185 lb (83.9 kg)  PainSc: 3   PainLoc: Back    LUNGS: decreased breath sounds CARDIAC: Cor Brady  EXTREMITIES: 1+ edema SKIN: Skin color, texture, turgor normal. No rashes or lesions  NEURO: positive for gait problems and weakness       [name], RN

## 2018-12-31 NOTE — Progress Notes (Signed)
COMMUNITY PALLIATIVE CARE SW NOTE  PATIENT NAME: Amy [name] DOB: 07/01/1927 MRN: [id number]  PRIMARY CARE PROVIDER: [name], MD  RESPONSIBLE PARTY:  Acct ID - Guarantor Home Phone Work Phone Relationship Acct Type  [id number] Amy [name],* [phone]  Self P/F     [address], [place], [place] [address]     PLAN OF CARE and INTERVENTIONS:             1. GOALS OF CARE/ ADVANCE CARE PLANNING:  Patient is DNR. MOST form is complete. Patient's goal is to remain at home.  2. SOCIAL/EMOTIONAL/SPIRITUAL ASSESSMENT/ INTERVENTIONS:  SW and RN completed telehealth visit with patient. Patient reports feeling sleepy and very weak today. Patient said she has felt this week all week. Patient said she is eating "okay". Patient said she is resting well and napping on and off. Patient also notes shortness of breath. RN to make in-person visit today due to concerns for increased weakness. 3. PATIENT/CAREGIVER EDUCATION/ COPING:  Patient was alert, expressed feeling tired. Patient sounded weak on the phone. Supportive family.  4. PERSONAL EMERGENCY PLAN:  Patient has life alert and family will call 9-1-1 for emergencies. Due to COVID-19, patient only goes out for doctor's appointments. 5. COMMUNITY RESOURCES COORDINATION/ HEALTH CARE NAVIGATION:  Patient coordinates her appointments.  6. FINANCIAL/LEGAL CONCERNS/INTERVENTIONS:  None.     SOCIAL HX:  Social History   Tobacco Use  . Smoking status: Never Smoker  . Smokeless tobacco: Never Used  Substance Use Topics  . Alcohol use: No    CODE STATUS:   Code Status: Prior (DNR) ADVANCED DIRECTIVES: Y MOST FORM COMPLETE:  Yes. HOSPICE EDUCATION PROVIDED: None.  PPS: Patient is independent of ADLs.Patient uses her walker or a wheelchair when she goes out of the home.  I spent30 minutes with patient/family, from9:30-10:00aproviding education, support and consultation.  Margaretmary Lombard, LCSW

## 2019-01-03 ENCOUNTER — Telehealth: Payer: Self-pay

## 2019-01-03 NOTE — Telephone Encounter (Signed)
Telephone call to follow up on patient's status.  RN placed call to patients MD on Friday regarding patients B/P being elevated and patient feeling weak.  RN didn ot receive return phone call from MD's office.  RN called patient to ask if MD had called her and to see if patient is feeling better.  Patient reports she does not feel as weak.  Patient states she did not receive a call from MD's office.  Patient states maybe MD feels he can not do anything else for me.  Support provided to patient.  Patient encouraged to call with questions or concerns.

## 2019-01-10 ENCOUNTER — Other Ambulatory Visit (INDEPENDENT_AMBULATORY_CARE_PROVIDER_SITE_OTHER): Payer: Self-pay | Admitting: Nephrology

## 2019-01-10 DIAGNOSIS — I10 Essential (primary) hypertension: Secondary | ICD-10-CM

## 2019-01-11 ENCOUNTER — Ambulatory Visit (INDEPENDENT_AMBULATORY_CARE_PROVIDER_SITE_OTHER): Payer: Medicare Other

## 2019-01-11 ENCOUNTER — Other Ambulatory Visit: Payer: Self-pay

## 2019-01-11 DIAGNOSIS — I10 Essential (primary) hypertension: Secondary | ICD-10-CM

## 2019-01-14 ENCOUNTER — Ambulatory Visit
Admission: RE | Admit: 2019-01-14 | Discharge: 2019-01-14 | Disposition: A | Discharge status: Home / Self Care | Payer: Medicare Other | Source: Ambulatory Visit | Attending: Internal Medicine | Admitting: Internal Medicine

## 2019-01-14 DIAGNOSIS — Z1231 Encounter for screening mammogram for malignant neoplasm of breast: Secondary | ICD-10-CM | POA: Diagnosis present

## 2019-01-24 ENCOUNTER — Telehealth: Payer: Self-pay

## 2019-01-24 NOTE — Telephone Encounter (Signed)
Telephone call to patient to schedule palliative care visit.  Patient in agreement with RN making home visit 01-25-19 at 10:00 AM.

## 2019-01-25 ENCOUNTER — Other Ambulatory Visit: Payer: Medicare Other

## 2019-01-25 ENCOUNTER — Other Ambulatory Visit: Payer: Self-pay

## 2019-01-25 DIAGNOSIS — Z515 Encounter for palliative care: Secondary | ICD-10-CM

## 2019-01-25 NOTE — Progress Notes (Signed)
PATIENT NAME: Amy [name] DOB: [birth year] MRN: [id number]  PRIMARY CARE PROVIDER: [name], MD  RESPONSIBLE PARTY:  Acct ID - Guarantor Home Phone Work Phone Relationship Acct Type  [id number] Amy [name], Amy [name] [phone]  Self P/F     [address], [place], Amy [name] [address]    PLAN OF CARE and INTERVENTIONS:               1.  GOALS OF CARE/ ADVANCE CARE PLANNING:  Patient wants to remain in her home with family checking on her.                  2.  PATIENT/CAREGIVER EDUCATION:  Education on fall precautions, education on foods high in Magnesium, support               3.  DISEASE STATUS: RN made home care visit to see patient. Patient sitting on sofa watching TV. Patient complains of pain in mid upper back on right side. Patient states MD informed her she had a cyst in her right kidney. Patient states she is not currently taking anything for discomfort. Patient states that she saw Dr. Edwina [name] approximately 2 weeks ago. Dr Amy [name] made no changes in patients current medications. Patient  states her eyes have been having drainage and the left one is matted closed in the morning. Patient states she feels like this is due to allergies. Patients blood pressure continues to be elevated. Patient reports kidney MD called her 2-3 days after nurse made visit last month. Patient's blood pressure 170/0 until it bottomed out at visit today. Patient continues to take hydralazine 100 mg 3 times a day. Patient reports her appetite is fair. Patient states she doesn't eat that much but she eats the wrong foods. Patient continues to have shortness of breath at rest however O2 sats are good. Patient states she has a dry cough once in awhile. Patient has not suffered any recent falls. Patient to see pulmonologist December 8th. Nurse provided education to patients on food rich in magnesium such as bananas, whole grains, nuts and legumes. Patient continues to have edema in her left lower extremity. Education  to patient on fall precautions. Patient appreciative of palliative care visit. Patient encouraged to contact palliative care with questions or concerns.       HISTORY OF PRESENT ILLNESS:  Patient is a 85 year old female who resides in her home next door to get granddaughter.  Patient is sen monthly and PRN by PMPM team.   CODE STATUS: DNR  ADVANCED DIRECTIVES: Y MOST FORM: Y PPS: 40%   PHYSICAL EXAM:   VITALS: Today's Vitals   01/25/19 1020  BP: (!) 170/0  Pulse: 62  Resp: 20  Temp: (!) 97.3 F (36.3 C)  TempSrc: Temporal  PainSc: 2   PainLoc: Back    LUNGS: clear to auscultation  CARDIAC: Cor RRR  EXTREMITIES: 1+ edema SKIN: Skin color, texture, turgor normal. No rashes or lesions  NEURO: positive for gait problems and weakness       Amy Simmer, RN

## 2019-01-28 ENCOUNTER — Other Ambulatory Visit: Payer: Self-pay

## 2019-01-28 ENCOUNTER — Ambulatory Visit (INDEPENDENT_AMBULATORY_CARE_PROVIDER_SITE_OTHER): Payer: Medicare Other | Admitting: Vascular Surgery

## 2019-01-28 ENCOUNTER — Telehealth (INDEPENDENT_AMBULATORY_CARE_PROVIDER_SITE_OTHER): Payer: Self-pay

## 2019-01-28 ENCOUNTER — Encounter (INDEPENDENT_AMBULATORY_CARE_PROVIDER_SITE_OTHER): Payer: Self-pay | Admitting: Vascular Surgery

## 2019-01-28 VITALS — BP 163/63 | HR 69 | Resp 16

## 2019-01-28 DIAGNOSIS — I701 Atherosclerosis of renal artery: Secondary | ICD-10-CM | POA: Diagnosis not present

## 2019-01-28 DIAGNOSIS — N183 Chronic kidney disease, stage 3 unspecified: Secondary | ICD-10-CM

## 2019-01-28 DIAGNOSIS — E782 Mixed hyperlipidemia: Secondary | ICD-10-CM | POA: Diagnosis not present

## 2019-01-28 DIAGNOSIS — E1122 Type 2 diabetes mellitus with diabetic chronic kidney disease: Secondary | ICD-10-CM | POA: Diagnosis not present

## 2019-01-28 DIAGNOSIS — I2510 Atherosclerotic heart disease of native coronary artery without angina pectoris: Secondary | ICD-10-CM

## 2019-01-28 NOTE — Progress Notes (Signed)
Patient ID: Amy [name], female   DOB: 1927/07/13, 85 y.o.   MRN: [id number]  Chief Complaint  Patient presents with  . New Patient (Initial Visit)    ref Candiss Norse    HPI Amy [name] is a 85 y.o. female.  I am asked to see the patient by Dr. Candiss Norse for evaluation of left renal artery stenosis with poorly controlled hypertension and chronic kidney disease.  She had a lab only study about 2 weeks ago which suggest a greater than 60% left renal artery stenosis by duplex criteria.  The patient has longstanding blood pressure which has become more difficult to control over the past several months.  She is on multiple medications as listed below.  She has no previous renal artery intervention or surgery to her knowledge.  She denies any fevers or chills.  She has never had dialysis at this point.   Past Medical History:  Diagnosis Date  . Bladder incontinence   . CAD (coronary artery disease)    a. 05/2009 Cath: LAD 90p (Xience 2.75 x 12 mm DES), 36m, D1 40, LCx 40p/m, RCA 30/40/30p/m, RCA 30/25d;  b.  12/2011 Lexiscan MV: no ischemia, breast attenuation artifact, normal EF-->Low risk; c. 10/2016 MV: fixed apical defect, most likely apical thinning and attenuation, No ischemia, EF 60%.  . Cancer ([place])    ovarian  . Carotid arterial disease w/ R Carotid Bruit (HCC)    a. 08/2016 Carotid U/S: 40-59% bilat ICA stenosis - f/u 1 yr.  . Chronic diastolic (congestive) heart failure ([place])    a. Echo 09/2015: EF 55-60% w/ Grade 1 DD, sev Ca2+ MV annulus, mildly dil LA; b. 09/2016 Echo: EF 65-70%, Gr2 DD, mildly dil LA/RA, nl RV fxn.  . Chronic Dyspnea on exertion   . CKD (chronic kidney disease) stage 3, GFR 30-59 ml/min   . Degenerative arthritis of knee    bilateral knees  . Diabetes mellitus    Type II  . GIB (gastrointestinal bleeding)    a. 08/2016 Admission w/ presyncope/anemia/melena-->req Transfusion-->endo ok, colonoscopy w/ polyps but no source of bleeding (most likely  diverticular).  . Hiatal hernia   . Hypertension   . Iron deficiency   . Menopausal symptoms   . Morbid obesity ([place])   . Renal insufficiency   . Thyroid disease    hypothyroidism    Past Surgical History:  Procedure Laterality Date  . COLONOSCOPY  2015  . COLONOSCOPY WITH PROPOFOL N/A 09/25/2016   Procedure: COLONOSCOPY WITH PROPOFOL;  Surgeon: [name], MD;  Location: [facility] ENDOSCOPY;  Service: Gastroenterology;  Laterality: N/A;  . COLONOSCOPY WITH PROPOFOL N/A 09/26/2016   Procedure: COLONOSCOPY WITH PROPOFOL;  Surgeon: [name], MD;  Location: [facility] ENDOSCOPY;  Service: Gastroenterology;  Laterality: N/A;  . ESOPHAGOGASTRODUODENOSCOPY (EGD) WITH PROPOFOL N/A 09/25/2016   Procedure: ESOPHAGOGASTRODUODENOSCOPY (EGD) WITH PROPOFOL;  Surgeon: [name], MD;  Location: [facility] ENDOSCOPY;  Service: Gastroenterology;  Laterality: N/A;  . REPLACEMENT TOTAL KNEE BILATERAL    . TOTAL VAGINAL HYSTERECTOMY     ovarian mass, not cancerous  . UPPER GI ENDOSCOPY  2015     Family History  Problem Relation Age of Onset  . Breast cancer Mother 53  No bleeding disorders, clotting disorders, aneurysms, or porphyria   Social History   Tobacco Use  . Smoking status: Never Smoker  . Smokeless tobacco: Never Used  Substance Use Topics  . Alcohol use: No  . Drug use: No    Allergies  Allergen  Reactions  . Losartan Other (See Comments)    Hyperkalemia  . Morphine And Related Shortness Of Breath    Rash, difficulty breathing, nausea.   . Atenolol Other (See Comments)    Other reaction(s): Other (See Comments) Decreased heart rate Decreased heart rate  . Codeine     Rash, difficulty breathing, nausea.  . Nsaids Other (See Comments)    Other reaction(s): Unknown  . Rofecoxib     Other reaction(s): Unknown  . Sucralfate Other (See Comments)    Throat tightness  . Sulfa Antibiotics     Other reaction(s): Unknown    Current Outpatient Medications  Medication Sig Dispense Refill   . albuterol (PROVENTIL HFA;VENTOLIN HFA) 108 (90 Base) MCG/ACT inhaler Inhale 2 puffs into the lungs every 6 (six) hours as needed for wheezing or shortness of breath.    . cloNIDine (CATAPRES - DOSED IN MG/24 HR) 0.2 mg/24hr patch Place 1 patch (0.2 mg total) onto the skin once a week. 4 patch 12  . clopidogrel (PLAVIX) 75 MG tablet Take 1 tablet (75 mg total) by mouth daily. 90 tablet 3  . docusate sodium (COLACE) 100 MG capsule Take 100 mg by mouth daily as needed for mild constipation. Takes daily    . doxazosin (CARDURA) 8 MG tablet     . erythromycin ophthalmic ointment     . furosemide (LASIX) 20 MG tablet Take 1 tablet (20 mg) by mouth once daily as needed for weight gain/ shortness of breath/ swelling    . hydrALAZINE (APRESOLINE) 25 MG tablet Take 1 tablet (25 mg) by mouth two times a day. 180 tablet 2  . ipratropium-albuterol (DUONEB) 0.5-2.5 (3) MG/3ML SOLN Take 3 mLs by nebulization every 4 (four) hours as needed (shortness of breath/wheezing.). 360 mL   . levothyroxine (SYNTHROID, LEVOTHROID) 125 MCG tablet Take 125 mcg by mouth daily.    . metFORMIN (GLUCOPHAGE) 1000 MG tablet Take 1,000 mg by mouth 2 (two) times daily with a meal.    . oxybutynin (DITROPAN-XL) 5 MG 24 hr tablet Take 5 mg by mouth daily.     . pantoprazole (PROTONIX) 20 MG tablet Take 20 mg by mouth daily.    . simvastatin (ZOCOR) 40 MG tablet Take 1 tablet (40 mg total) by mouth at bedtime. 90 tablet 2  . SYMBICORT 160-4.5 MCG/ACT inhaler Inhale 2 puffs into the lungs 2 (two) times daily.     Marland Kitchen lisinopril (PRINIVIL,ZESTRIL) 10 MG tablet Take 1 tablet (10 mg total) by mouth daily. 90 tablet 3   No current facility-administered medications for this visit.    Facility-Administered Medications Ordered in Other Visits  Medication Dose Route Frequency Provider Last Rate Last Dose  . 0.9 %  sodium chloride infusion   Intravenous Continuous Lloyd Huger, MD          REVIEW OF SYSTEMS (Negative unless  checked)  Constitutional: [] Weight loss  [] Fever  [] Chills Cardiac: [] Chest pain   [] Chest pressure   [] Palpitations   [] Shortness of breath when laying flat   [] Shortness of breath at rest   [] Shortness of breath with exertion. Vascular:  [] Pain in legs with walking   [] Pain in legs at rest   [] Pain in legs when laying flat   [] Claudication   [] Pain in feet when walking  [] Pain in feet at rest  [] Pain in feet when laying flat   [] History of DVT   [] Phlebitis   [x] Swelling in legs   [] Varicose veins   [] Non-healing ulcers Pulmonary:   []   Uses home oxygen   [] Productive cough   [] Hemoptysis   [] Wheeze  [] COPD   [] Asthma Neurologic:  [] Dizziness  [] Blackouts   [] Seizures   [] History of stroke   [] History of TIA  [] Aphasia   [] Temporary blindness   [] Dysphagia   [] Weakness or numbness in arms   [] Weakness or numbness in legs Musculoskeletal:  [x] Arthritis   [] Joint swelling   [x] Joint pain   [] Low back pain Hematologic:  [] Easy bruising  [] Easy bleeding   [] Hypercoagulable state   [] Anemic  [] Hepatitis Gastrointestinal:  [] Blood in stool   [] Vomiting blood  [x] Gastroesophageal reflux/heartburn   [] Abdominal pain Genitourinary:  [x] Chronic kidney disease   [] Difficult urination  [] Frequent urination  [] Burning with urination   [] Hematuria Skin:  [] Rashes   [] Ulcers   [] Wounds Psychological:  [] History of anxiety   []  History of major depression.    Physical Exam BP (!) 163/63 (BP Location: Right Arm)   Pulse 69   Resp 16  Gen:  WD/WN, NAD Head: [place]/AT, No temporalis wasting Ear/Nose/Throat: Hearing grossly intact, nares w/o erythema or drainage, oropharynx w/o Erythema/Exudate Eyes: Conjunctiva clear, sclera non-icteric  Neck: trachea midline.  No JVD.  Pulmonary:  Good air movement, respirations not labored, no use of accessory muscles  Cardiac: RRR, no JVD Vascular:  Vessel Right Left  Radial Palpable Palpable                                   Gastrointestinal:. No masses, surgical  incisions, or scars. Musculoskeletal: M/S 5/5 throughout.  Extremities without ischemic changes.  No deformity or atrophy.  1+ bilateral lower extremity edema.  Uses a wheelchair. Neurologic: Sensation grossly intact in extremities.  Symmetrical.  Speech is fluent. Motor exam as listed above. Psychiatric: Judgment intact, Mood & affect appropriate for pt's clinical situation. Dermatologic: No rashes or ulcers noted.  No cellulitis or open wounds.    Radiology Mm 3d Screen Breast Bilateral  Result Date: 01/14/2019 CLINICAL DATA:  Screening. EXAM: DIGITAL SCREENING BILATERAL MAMMOGRAM WITH TOMO AND CAD COMPARISON:  Previous exam(s). ACR Breast Density Category b: There are scattered areas of fibroglandular density. FINDINGS: There are no findings suspicious for malignancy. Images were processed with CAD. IMPRESSION: No mammographic evidence of malignancy. A result letter of this screening mammogram will be mailed directly to the patient. RECOMMENDATION: Screening mammogram in one year. (Code:SM-B-01Y) BI-RADS CATEGORY  1: Negative. Electronically Signed   By: Lovey Newcomer M.D.   On: 01/14/2019 12:41   Renal Artery  Result Date: 01/11/2019 ABDOMINAL VISCERAL Performing Technologist: Almira Coaster RVS  Examination Guidelines: A complete evaluation includes B-mode imaging, spectral Doppler, color Doppler, and power Doppler as needed of all accessible portions of each vessel. Bilateral testing is considered an integral part of a complete examination. Limited examinations for reoccurring indications may be performed as noted.  Duplex Findings: +------------+--------+--------+------+--------+             PSV cm/sEDV cm/sPlaqueComments +------------+--------+--------+------+--------+ Aorta Distal  161      11                  +------------+--------+--------+------+--------+    +------------------+--------+--------+-------+ Right Renal ArteryPSV cm/sEDV cm/sComment  +------------------+--------+--------+-------+ Origin              119      11           +------------------+--------+--------+-------+ Proximal  129      10           +------------------+--------+--------+-------+ Mid                  72      13           +------------------+--------+--------+-------+ Distal               80      12           +------------------+--------+--------+-------+ +-----------------+--------+--------+-------+ Left Renal ArteryPSV cm/sEDV cm/sComment +-----------------+--------+--------+-------+ Origin             103      15           +-----------------+--------+--------+-------+ Proximal           199      0            +-----------------+--------+--------+-------+ Mid                166      17           +-----------------+--------+--------+-------+ Distal             110      18           +-----------------+--------+--------+-------+ +------------+--------+--------+--+-----------+--------+--------+---+ Right KidneyPSV cm/sEDV cm/sRILeft KidneyPSV cm/sEDV cm/sRI  +------------+--------+--------+--+-----------+--------+--------+---+ Upper Pole                    Upper Pole                     +------------+--------+--------+--+-----------+--------+--------+---+ Mid                           Mid                            +------------+--------+--------+--+-----------+--------+--------+---+ Lower Pole                    Lower Pole                     +------------+--------+--------+--+-----------+--------+--------+---+ Hilar                         Hilar                          +------------+--------+--------+--+-----------+--------+--------+---+ +------------------+-----+------------------+-----+ Right Kidney           Left Kidney             +------------------+-----+------------------+-----+ RAR                    RAR                      +------------------+-----+------------------+-----+ RAR (manual)           RAR (manual)            +------------------+-----+------------------+-----+ Cortex                 Cortex                  +------------------+-----+------------------+-----+ Cortex thickness       Corex thickness         +------------------+-----+------------------+-----+ Kidney length (cm)10.12Kidney length (cm)10.90 +------------------+-----+------------------+-----+  Summary: Renal:  Right: RRV flow present. Normal size right kidney. 1-59% stenosis of  the right renal artery. RAR could not be calculated due to        elevated Velocities seen in the Aorta exceeding 100cm/s. Left:  LRV flow present. Normal size of left kidney. Elevated        Velocities seen in the Left Renal Artery suggestive of >60%        stenosis. RAR could not be calculated due to elevated        Velocities seen in the Aorta exceeding 100cm/s.  *See table(s) above for measurements and observations.  Diagnosing physician: Leotis Pain MD  Electronically signed by Leotis Pain MD on 01/11/2019 at 4:46:07 PM.    Final     Labs Recent Results (from the past 2160 hour(s))  Iron and TIBC     Status: Abnormal   Collection Time: 12/13/18 10:35 AM  Result Value Ref Range   Iron 76 28 - 170 ug/dL   TIBC 208 (L) 250 - 450 ug/dL   Saturation Ratios 37 (H) 10.4 - 31.8 %   UIBC 132 ug/dL    Comment: Performed at [facility], [address] [place], [place] [address]  Hold Tube- Blood Bank     Status: None   Collection Time: 12/13/18 10:35 AM  Result Value Ref Range   Blood Bank Specimen SAMPLE AVAILABLE FOR TESTING    Sample Expiration      12/16/2018,2359 Performed at [facility], [address] [place], [place] [address]   Ferritin     Status: None   Collection Time: 12/13/18 10:35 AM  Result Value Ref Range   Ferritin 195 11 - 307 ng/mL    Comment: Performed at [facility], [address]  [place], [place] [address]  CBC with Differential/Platelet     Status: Abnormal   Collection Time: 12/13/18 10:35 AM  Result Value Ref Range   WBC 4.9 4.0 - 10.5 K/uL   RBC 2.95 (L) 3.87 - 5.11 MIL/uL   Hemoglobin 9.4 (L) 12.0 - 15.0 g/dL   HCT 28.8 (L) 36.0 - 46.0 %   MCV 97.6 80.0 - 100.0 fL   MCH 31.9 26.0 - 34.0 pg   MCHC 32.6 30.0 - 36.0 g/dL   RDW 12.6 11.5 - 15.5 %   Platelets 165 150 - 400 K/uL   nRBC 0.0 0.0 - 0.2 %   Neutrophils Relative % 62 %   Neutro Abs 3.1 1.7 - 7.7 K/uL   Lymphocytes Relative 24 %   Lymphs Abs 1.2 0.7 - 4.0 K/uL   Monocytes Relative 11 %   Monocytes Absolute 0.5 0.1 - 1.0 K/uL   Eosinophils Relative 3 %   Eosinophils Absolute 0.2 0.0 - 0.5 K/uL   Basophils Relative 0 %   Basophils Absolute 0.0 0.0 - 0.1 K/uL   Immature Granulocytes 0 %   Abs Immature Granulocytes 0.01 0.00 - 0.07 K/uL    Comment: Performed at [facility], [address] [place], [place] [address]    Assessment/Plan:  Hyperlipidemia lipid control important in reducing the progression of atherosclerotic disease. Continue statin therapy   Diabetes ([place]) blood glucose control important in reducing the progression of atherosclerotic disease. Also, involved in wound healing. On appropriate medications.   CAD, NATIVE VESSEL Continue cardiac and antihypertensive medications as already ordered and reviewed, no changes at this time. Continue statin as ordered and reviewed, no changes at this time Nitrates PRN for chest pain   Renal artery stenosis [facility]) The patient has poorly controlled hypertension  and chronic kidney disease with a duplex finding of left renal artery stenosis.  Given her clinical scenario, she would be appropriate for angiogram with planned intervention for renal artery stenosis.  I discussed the pathophysiology and natural history of renal artery stenosis and why it causes high blood pressure and kidney dysfunction.  I discussed the reason and rationale for  treatment.  She will be scheduled for renal angiogram with possible intervention in the near future.  Risks and benefits are discussed.      Leotis Pain 01/28/2019, 10:40 AM   This note was created with Dragon medical transcription system.  Any errors from dictation are unintentional.

## 2019-01-28 NOTE — Assessment & Plan Note (Signed)
lipid control important in reducing the progression of atherosclerotic disease. Continue statin therapy  

## 2019-01-28 NOTE — Assessment & Plan Note (Signed)
blood glucose control important in reducing the progression of atherosclerotic disease. Also, involved in wound healing. On appropriate medications.  

## 2019-01-28 NOTE — Patient Instructions (Signed)
Renal Artery Stenosis Renal artery stenosis (RAS) is a narrowing of the artery that carries blood to the kidneys. It can affect one or both kidneys. The kidneys filter waste and extra fluid from the blood. Waste and fluid are then removed when a person passes urine. The kidneys also make an important chemical messenger (hormone) called renin. Renin helps regulate blood pressure. The first sign of RAS may be high blood pressure. Other symptoms can develop over time. What are the causes? A common cause of this condition is plaque buildup in your arteries (atherosclerosis). The plaques that cause this are made up of:  Fat.  Cholesterol.  Calcium.  Other substances. As these substances build up in your renal artery, the blood supply to your kidneys slows. The lack of blood and oxygen causes the signs and symptoms of RAS. A much less common cause of RAS is a disease called fibromuscular dysplasia. This disease causes abnormal cell growth that narrows the renal artery. It is not related to atherosclerosis. It occurs mostly in women who are 10-58 years old. It may be passed down through families.  What increases the risk? You are more likely to develop this condition if you:  Are a man who is at least 83 years old.  Are a woman who is at least 83 years old.  Have high blood pressure.  Have high cholesterol.  Are a smoker.  Abuse alcohol.  Have diabetes or prediabetes.  Are overweight or obese.  Have a family history of early heart disease. What are the signs or symptoms? RAS usually develops slowly. You may not have any signs or symptoms at first. Early signs may include:  Development of high blood pressure.  A sudden increase in existing high blood pressure.  No longer responding to medicine that used to control your blood pressure. Later signs and symptoms are due to kidney damage. They may include:  Feeling tired (fatigue).  Shortness of breath.  Swollen legs and feet.   Dry skin.  Headaches.  Muscle cramps.  Loss of appetite.  Nausea or vomiting. How is this diagnosed? This condition may be diagnosed based on:  Your symptoms and medical history. Your health care provider may suspect RAS based on changes in your blood pressure and your risk factors.  A physical exam. During the exam, your health care provider will use a stethoscope to listen for a whooshing sound (bruit) that can occur where the renal artery is blocking blood flow.  Various tests. These may include: ? Blood and urine tests to check your kidney function. ? Imaging tests of your kidneys, such as:  A test that uses sound waves to create an image of your kidneys and the blood flow to your kidneys (ultrasound).  A test in which dye is injected into one of your blood vessels so images can be taken as the dye flows through your renal arteries (angiogram). This can be done using X-rays, a CT scan (computed tomography angiogram, CTA), or a type of MRI (magnetic resonance angiogram, MRA). How is this treated? Making lifestyle changes to reduce your risk factors is the first treatment option for early RAS. If the blood flow to one of your kidneys is cut by more than half, you may need medicine to:  Lower your blood pressure. This is the main medical treatment for RAS. You may need more than one type of medicine for this. The types that work best for people with RAS are: ? ACE inhibitors. ? Angiotensin receptor  blockers.  Reduce fluid in the body (diuretics).  Lower your cholesterol (statins). If medicine is not enough to control RAS, you may need surgery. This may involve:  Threading a tube with an inflatable balloon into the renal artery to force it to open (angioplasty).  Removing plaque from inside the artery (endarterectomy). Follow these instructions at home:  Lifestyle  Make any lifestyle changes recommended by your health care provider. This may include: ? Working with a  dietitian to maintain a heart-healthy diet. This type of diet is low in saturated fat, salt, and added sugar. ? Starting an exercise program as directed by your health care provider. ? Maintaining a healthy weight. ? Quitting smoking. ? Not abusing alcohol. General instructions  Take over-the-counter and prescription medicines only as told by your health care provider.  Keep all follow-up visits as told by your health care provider. This is important. Contact a health care provider if:  Your symptoms of RAS are not getting better.  Your symptoms are changing or getting worse. Get help right away if you have:  Very bad pain in your back or abdomen.  Blood in your urine. Summary  Renal artery stenosis (RAS) is a narrowing of the artery that carries blood to the kidneys. It can affect one or both kidneys.  RAS usually develops slowly. You may not have any signs or symptoms at first, but high blood pressure that is difficult to control is a key symptom.  Making lifestyle changes to reduce your risk factors is the first treatment option for early RAS. If the blood flow to one of your kidneys is cut by more than half, you may need medicines to help manage your cholesterol and blood pressure. This information is not intended to replace advice given to you by your health care provider. Make sure you discuss any questions you have with your health care provider. Document Released: 11/06/2004 Document Revised: 03/09/2017 Document Reviewed: 03/09/2017 Elsevier Patient Education  2020 [employer].

## 2019-01-28 NOTE — Assessment & Plan Note (Signed)
The patient has poorly controlled hypertension and chronic kidney disease with a duplex finding of left renal artery stenosis.  Given her clinical scenario, she would be appropriate for angiogram with planned intervention for renal artery stenosis.  I discussed the pathophysiology and natural history of renal artery stenosis and why it causes high blood pressure and kidney dysfunction.  I discussed the reason and rationale for treatment.  She will be scheduled for renal angiogram with possible intervention in the near future.  Risks and benefits are discussed.

## 2019-01-28 NOTE — Telephone Encounter (Signed)
Spoke with the patient and she is now scheduled with Dr. Lucky Cowboy for renal angio and stent placement on 02/07/2019 with a 7:45 am arrival time to the [facility]. Patient will do covid testing on 02/04/2019 between 12:30-2:30 pm at the [facility]. Pre-procedure instructions were discussed and will be mailed to the patient.

## 2019-01-28 NOTE — Assessment & Plan Note (Signed)
Continue cardiac and antihypertensive medications as already ordered and reviewed, no changes at this time. Continue statin as ordered and reviewed, no changes at this time Nitrates PRN for chest pain

## 2019-02-02 ENCOUNTER — Ambulatory Visit: Payer: Self-pay | Admitting: Urology

## 2019-02-03 ENCOUNTER — Other Ambulatory Visit: Payer: Medicare Other | Attending: Vascular Surgery

## 2019-02-04 ENCOUNTER — Other Ambulatory Visit
Admission: RE | Admit: 2019-02-04 | Discharge: 2019-02-04 | Disposition: A | Discharge status: Home / Self Care | Payer: Medicare Other | Source: Ambulatory Visit | Attending: Vascular Surgery | Admitting: Vascular Surgery

## 2019-02-04 DIAGNOSIS — Z20828 Contact with and (suspected) exposure to other viral communicable diseases: Secondary | ICD-10-CM | POA: Diagnosis not present

## 2019-02-04 DIAGNOSIS — Z01812 Encounter for preprocedural laboratory examination: Secondary | ICD-10-CM | POA: Insufficient documentation

## 2019-02-05 LAB — SARS CORONAVIRUS 2 (TAT 6-24 HRS): SARS Coronavirus 2: NEGATIVE

## 2019-02-07 ENCOUNTER — Other Ambulatory Visit: Payer: Self-pay

## 2019-02-07 ENCOUNTER — Ambulatory Visit
Admission: RE | Admit: 2019-02-07 | Discharge: 2019-02-07 | Disposition: A | Discharge status: Home / Self Care | Payer: Medicare Other | Attending: Vascular Surgery | Admitting: Vascular Surgery

## 2019-02-07 ENCOUNTER — Encounter
Admission: RE | Disposition: A | Discharge status: Home / Self Care | Payer: Self-pay | Source: Home / Self Care | Attending: Vascular Surgery

## 2019-02-07 ENCOUNTER — Other Ambulatory Visit (INDEPENDENT_AMBULATORY_CARE_PROVIDER_SITE_OTHER): Payer: Self-pay | Admitting: Nurse Practitioner

## 2019-02-07 ENCOUNTER — Encounter: Payer: Self-pay | Admitting: Cardiology

## 2019-02-07 DIAGNOSIS — I701 Atherosclerosis of renal artery: Secondary | ICD-10-CM | POA: Insufficient documentation

## 2019-02-07 DIAGNOSIS — E039 Hypothyroidism, unspecified: Secondary | ICD-10-CM | POA: Diagnosis not present

## 2019-02-07 DIAGNOSIS — M170 Bilateral primary osteoarthritis of knee: Secondary | ICD-10-CM | POA: Diagnosis not present

## 2019-02-07 DIAGNOSIS — Z7984 Long term (current) use of oral hypoglycemic drugs: Secondary | ICD-10-CM | POA: Diagnosis not present

## 2019-02-07 DIAGNOSIS — I6523 Occlusion and stenosis of bilateral carotid arteries: Secondary | ICD-10-CM | POA: Insufficient documentation

## 2019-02-07 DIAGNOSIS — N183 Chronic kidney disease, stage 3 unspecified: Secondary | ICD-10-CM | POA: Insufficient documentation

## 2019-02-07 DIAGNOSIS — I5032 Chronic diastolic (congestive) heart failure: Secondary | ICD-10-CM | POA: Diagnosis not present

## 2019-02-07 DIAGNOSIS — Z79890 Hormone replacement therapy (postmenopausal): Secondary | ICD-10-CM | POA: Insufficient documentation

## 2019-02-07 DIAGNOSIS — E782 Mixed hyperlipidemia: Secondary | ICD-10-CM | POA: Diagnosis not present

## 2019-02-07 DIAGNOSIS — Z885 Allergy status to narcotic agent status: Secondary | ICD-10-CM | POA: Diagnosis not present

## 2019-02-07 DIAGNOSIS — I2510 Atherosclerotic heart disease of native coronary artery without angina pectoris: Secondary | ICD-10-CM | POA: Diagnosis not present

## 2019-02-07 DIAGNOSIS — Z6831 Body mass index (BMI) 31.0-31.9, adult: Secondary | ICD-10-CM | POA: Diagnosis not present

## 2019-02-07 DIAGNOSIS — Z7902 Long term (current) use of antithrombotics/antiplatelets: Secondary | ICD-10-CM | POA: Diagnosis not present

## 2019-02-07 DIAGNOSIS — Z7951 Long term (current) use of inhaled steroids: Secondary | ICD-10-CM | POA: Diagnosis not present

## 2019-02-07 DIAGNOSIS — Z79899 Other long term (current) drug therapy: Secondary | ICD-10-CM | POA: Insufficient documentation

## 2019-02-07 DIAGNOSIS — Z886 Allergy status to analgesic agent status: Secondary | ICD-10-CM | POA: Insufficient documentation

## 2019-02-07 DIAGNOSIS — I150 Renovascular hypertension: Secondary | ICD-10-CM | POA: Diagnosis present

## 2019-02-07 DIAGNOSIS — Z882 Allergy status to sulfonamides status: Secondary | ICD-10-CM | POA: Diagnosis not present

## 2019-02-07 DIAGNOSIS — E1122 Type 2 diabetes mellitus with diabetic chronic kidney disease: Secondary | ICD-10-CM | POA: Diagnosis not present

## 2019-02-07 DIAGNOSIS — I130 Hypertensive heart and chronic kidney disease with heart failure and stage 1 through stage 4 chronic kidney disease, or unspecified chronic kidney disease: Secondary | ICD-10-CM | POA: Diagnosis not present

## 2019-02-07 HISTORY — PX: RENAL ANGIOGRAPHY: CATH118260

## 2019-02-07 LAB — CREATININE, SERUM
Creatinine, Ser: 2.01 mg/dL — ABNORMAL HIGH (ref 0.44–1.00)
GFR calc Af Amer: 25 mL/min — ABNORMAL LOW (ref >60)
GFR calc non Af Amer: 21 mL/min — ABNORMAL LOW (ref >60)

## 2019-02-07 LAB — GLUCOSE, CAPILLARY
Glucose-Capillary: 109 mg/dL — ABNORMAL HIGH (ref 70–99)
Glucose-Capillary: 145 mg/dL — ABNORMAL HIGH (ref 70–99)

## 2019-02-07 LAB — BUN: BUN: 39 mg/dL — ABNORMAL HIGH (ref 8–23)

## 2019-02-07 SURGERY — RENAL ANGIOGRAPHY
Anesthesia: Moderate Sedation

## 2019-02-07 MED ORDER — HEPARIN SODIUM (PORCINE) 1000 UNIT/ML IJ SOLN
INTRAMUSCULAR | Status: AC
  Filled 2019-02-07: qty 1

## 2019-02-07 MED ORDER — MIDAZOLAM HCL 2 MG/ML PO SYRP: 8.0000 mg | ORAL_SOLUTION | Freq: Once | ORAL | Status: DC | PRN

## 2019-02-07 MED ORDER — ACETAMINOPHEN 325 MG PO TABS
ORAL_TABLET | ORAL | Status: AC
  Administered 2019-02-07: 650 mg via ORAL
  Filled 2019-02-07: qty 2

## 2019-02-07 MED ORDER — FAMOTIDINE 20 MG PO TABS: 40.0000 mg | ORAL_TABLET | Freq: Once | ORAL | Status: DC | PRN

## 2019-02-07 MED ORDER — FENTANYL CITRATE (PF) 100 MCG/2ML IJ SOLN: 12.5000 ug | Freq: Once | INTRAMUSCULAR | Status: DC | PRN

## 2019-02-07 MED ORDER — HEPARIN SODIUM (PORCINE) 1000 UNIT/ML IJ SOLN
INTRAMUSCULAR | Status: DC | PRN
  Administered 2019-02-07: 5000 [IU] via INTRAVENOUS

## 2019-02-07 MED ORDER — METHYLPREDNISOLONE SODIUM SUCC 125 MG IJ SOLR: 125.0000 mg | Freq: Once | INTRAMUSCULAR | Status: DC | PRN

## 2019-02-07 MED ORDER — CEFAZOLIN SODIUM-DEXTROSE 2-4 GM/100ML-% IV SOLN: 2.0000 g | Freq: Once | INTRAVENOUS | Status: AC

## 2019-02-07 MED ORDER — ACETAMINOPHEN 325 MG PO TABS: 650.0000 mg | ORAL_TABLET | Freq: Once | ORAL | Status: AC

## 2019-02-07 MED ORDER — DIPHENHYDRAMINE HCL 50 MG/ML IJ SOLN: 50.0000 mg | Freq: Once | INTRAMUSCULAR | Status: DC | PRN

## 2019-02-07 MED ORDER — CEFAZOLIN SODIUM-DEXTROSE 2-4 GM/100ML-% IV SOLN
INTRAVENOUS | Status: AC
  Administered 2019-02-07: 2 g via INTRAVENOUS
  Filled 2019-02-07: qty 100

## 2019-02-07 MED ORDER — MIDAZOLAM HCL 2 MG/2ML IJ SOLN
INTRAMUSCULAR | Status: DC | PRN
  Administered 2019-02-07 (×2): 1 mg via INTRAVENOUS

## 2019-02-07 MED ORDER — FENTANYL CITRATE (PF) 100 MCG/2ML IJ SOLN
INTRAMUSCULAR | Status: AC
  Filled 2019-02-07: qty 2

## 2019-02-07 MED ORDER — FENTANYL CITRATE (PF) 100 MCG/2ML IJ SOLN
INTRAMUSCULAR | Status: DC | PRN
  Administered 2019-02-07: 50 ug via INTRAVENOUS
  Administered 2019-02-07: 25 ug via INTRAVENOUS

## 2019-02-07 MED ORDER — IODIXANOL 320 MG/ML IV SOLN
INTRAVENOUS | Status: DC | PRN
  Administered 2019-02-07: 40 mL via INTRA_ARTERIAL

## 2019-02-07 MED ORDER — HYDRALAZINE HCL 20 MG/ML IJ SOLN
INTRAMUSCULAR | Status: DC | PRN
  Administered 2019-02-07: 10 mg via INTRAVENOUS

## 2019-02-07 MED ORDER — SODIUM CHLORIDE 0.9 % IV SOLN
INTRAVENOUS | Status: DC
  Administered 2019-02-07: 08:00:00 via INTRAVENOUS

## 2019-02-07 MED ORDER — ONDANSETRON HCL 4 MG/2ML IJ SOLN: 4.0000 mg | Freq: Four times a day (QID) | INTRAMUSCULAR | Status: DC | PRN

## 2019-02-07 MED ORDER — HYDRALAZINE HCL 20 MG/ML IJ SOLN
INTRAMUSCULAR | Status: AC
  Filled 2019-02-07: qty 1

## 2019-02-07 MED ORDER — MIDAZOLAM HCL 5 MG/5ML IJ SOLN
INTRAMUSCULAR | Status: AC
  Filled 2019-02-07: qty 5

## 2019-02-07 SURGICAL SUPPLY — 13 items
CATH PIG 70CM (CATHETERS) ×3 IMPLANT
CATH VS15FR (CATHETERS) ×3 IMPLANT
COVER PROBE U/S 5X48 (MISCELLANEOUS) ×3 IMPLANT
DEVICE PRESTO INFLATION (MISCELLANEOUS) ×3 IMPLANT
DEVICE STARCLOSE SE CLOSURE (Vascular Products) ×3 IMPLANT
GLIDEWIRE STIFF .35X180X3 HYDR (WIRE) ×3 IMPLANT
SHEATH ANL2 6FRX45 HC (SHEATH) ×3 IMPLANT
SHEATH BRITE TIP 5FRX11 (SHEATH) ×3 IMPLANT
STENT LIFESTREAM 6X26X80 (Permanent Stent) ×3 IMPLANT
SYR MEDRAD MARK 7 150ML (SYRINGE) ×3 IMPLANT
TUBING CONTRAST HIGH PRESS 72 (TUBING) ×3 IMPLANT
WIRE J 3MM .035X145CM (WIRE) ×3 IMPLANT
WIRE MAGIC TOR.035 180C (WIRE) ×3 IMPLANT

## 2019-02-07 NOTE — Progress Notes (Signed)
BUN/CRE resulted . Called Dr. Lucky Cowboy . Made aware of GFR , Hx: CHFl BP elevated.To keep NS @ 75 m/hr. For now.

## 2019-02-07 NOTE — Progress Notes (Signed)
Dr. Lucky Cowboy at bedside , speaking with daughter and pt. Re: procedural results. Aware of c/o of severe lower back pain. Aware of BP: STATES "as long as her pressure is below 200 we are ok.

## 2019-02-07 NOTE — H&P (Signed)
[facility] History & Physical Update  The patient was interviewed and re-examined.  The patient's previous History and Physical has been reviewed and is unchanged.  There is no change in the plan of care. We plan to proceed with the scheduled procedure.  Leotis Pain, MD  02/07/2019, 8:04 AM

## 2019-02-07 NOTE — Op Note (Signed)
[facility] Percutaneous Study/Intervention Procedural Note    Surgeon(s): [employer]  Assistants: None  Pre-operative Diagnosis: Left renal artery stenosis, renovascular hypertension, renal insufficiency  Post-operative diagnosis: Same  Procedure(s) Performed: 1. Ultrasound guidance for vascular access right femoral artery 2. Catheter placement into left renal artery from right femoral approach 3. Aortogram and selective left renal angiogram 4. Balloon expandable stent placement to left renal artery with a 6 mm diameter x 26 mm length lifestream stent 5. StarClose closure device right femoral artery  Contrast: 40 cc  EBL: 5 cc  Fluoro Time: 3.5 minutes  Moderate conscious sedation: Approximately 20 minutes with 2 mg of Versed and 75 mcg of Fentanyl  Indications: The patient is an 85 year old female with worsening severe hypertension despite multiple medications and worsening of her renal function. The patient has suboptimal blood pressure control despite multiple antihypertensives and a noninvasive study demonstrating hemodynamically significant left renal artery stenosis. Given the clinical scenario and the noninvasive findings, angiogram is indicated for further evaluation of her renal artery and potential treatment. Risks and benefits are discussed and informed consent is obtained.  Procedure: The patient was identified and appropriate procedural time out was performed. The patient was then placed supine on the table and prepped and draped in the usual sterile fashion.Moderate conscious sedation was administered with a face to face encounter with the patient throughout the procedure with my supervision of the RN administering medicines and monitoring the patients vital signs and mental status throughout from the start of the procedure until the patient was taken to the  recovery room  Ultrasound was used to evaluate the right common femoral artery. It was patent . A digital ultrasound image was acquired. A Seldinger needle was used to access the right common femoral artery under direct ultrasound guidance and a permanent image was performed. A 0.035 J wire was advanced without resistance and a 5Fr sheath was placed. Pigtail catheter was placed into the aorta at the L1 level and an AP aortogram was performed while as a slight LAO projection aortogram. This demonstrated The aorta was highly calcific and moderately irregular in the infrarenal segment with at least a mild if not a moderate stenosis.  The SMA flow was brisk.  The right renal artery was seen and was large with very mild disease of less than 30%.  The left renal artery appeared to have significant stenosis particularly right at the origin in the 70 to 75% range. The patient was then systemically heparinized with 5000 units of intravenous. I used a V S1 catheter to cannulate the left renal artery and selective imaging was performed. This confirmed a 70 to 75% stenosis of the left renal artery.  At this point I selected the glide wire and crossed the lesion without difficulty.  I then exchanged for a Magic torque wire and remove the Glidewire and diagnostic catheter.  A 6 French Ansell sheath was then parked in the proximal left renal artery. I then selected a 6 mm diameter x 26 mm length balloon expandable stent and brought this across the lesion.  This was deployed encompassing the lesion with its proximal extent going back into the aorta for a mm or two.  This was inflated to 10 ATM and the waist resolved.  Completion angiogram showed markedly improved flow through the left kidney with less than 10% residual stenosis and a good nephrogram distally.  Oblique arteriogram was performed of the right femoral artery and StarClose closure device was deployed  in the usual fashion with excellent hemostatic result. The  patient was taken to the recovery room in stable condition having tolerated the procedure well.  Findings:  Aortogram/Renal Arteries:The aorta was highly calcific and moderately irregular in the infrarenal segment with at least a mild if not a moderate stenosis.  The SMA flow was brisk.  The right renal artery was seen and was large with very mild disease of less than 30%.  The left renal artery appeared to have significant stenosis particularly right at the origin in the 70 to 75% range.  This was seen both on the aortogram and then on the selective image of the left renal artery with a V S1 catheter.   Condition:  Stable  Complications: None   Leotis Pain 02/07/2019 9:53 AM  This note was created with Dragon Medical transcription system. Any errors in dictation are purely unintentional.

## 2019-02-08 ENCOUNTER — Ambulatory Visit: Payer: Self-pay | Admitting: Urology

## 2019-02-08 ENCOUNTER — Ambulatory Visit: Payer: Medicare Other | Admitting: Cardiovascular Disease

## 2019-02-08 ENCOUNTER — Telehealth (INDEPENDENT_AMBULATORY_CARE_PROVIDER_SITE_OTHER): Payer: Self-pay | Admitting: Vascular Surgery

## 2019-02-08 NOTE — Telephone Encounter (Signed)
I spoke with the patient and she informed that she feels pulling inside her leg from the knee up and having pain. I spoke with Dr Lucky Cowboy and he advise that the patient can take ibuprofen for pain and to call her PCP

## 2019-02-10 ENCOUNTER — Other Ambulatory Visit: Payer: Self-pay | Admitting: Physician Assistant

## 2019-02-10 ENCOUNTER — Ambulatory Visit
Admission: RE | Admit: 2019-02-10 | Discharge: 2019-02-10 | Disposition: A | Discharge status: Home / Self Care | Payer: Medicare Other | Source: Ambulatory Visit | Attending: Physician Assistant | Admitting: Physician Assistant

## 2019-02-10 ENCOUNTER — Other Ambulatory Visit: Payer: Self-pay

## 2019-02-10 DIAGNOSIS — M79604 Pain in right leg: Secondary | ICD-10-CM | POA: Diagnosis not present

## 2019-03-02 ENCOUNTER — Telehealth: Payer: Self-pay

## 2019-03-02 NOTE — Telephone Encounter (Signed)
Telephone call to patient to schedule palliative care visit with patient. Patient in agreement with telephonic visit on 03-03-19 at 9:30 AM.

## 2019-03-03 ENCOUNTER — Other Ambulatory Visit: Payer: Medicare Other

## 2019-03-03 ENCOUNTER — Other Ambulatory Visit: Payer: Self-pay

## 2019-03-03 DIAGNOSIS — Z515 Encounter for palliative care: Secondary | ICD-10-CM

## 2019-03-03 NOTE — Progress Notes (Signed)
COMMUNITY PALLIATIVE CARE SW NOTE  PATIENT NAME: Amy [name] DOB: Nov 14, 1927 MRN: [id number]  PRIMARY CARE PROVIDER: [name], MD  RESPONSIBLE PARTY:  Acct ID - Guarantor Home Phone Work Phone Relationship Acct Type  [id number] Marvell Stavola,* [phone]  Self P/F     [address], [place], [place] [address]     PLAN OF CARE and INTERVENTIONS:             1. GOALS OF CARE/ ADVANCE CARE PLANNING:  Patient is DNR.MOST form is complete.Patient's goal is to remain at home.  2. SOCIAL/EMOTIONAL/SPIRITUAL ASSESSMENT/ INTERVENTIONS:  SW completed TELEHEALTH visit with patient. Patient reports that she has felt "bad" for about three weeks. Patient went to see pulmonologist yesterday and reports that she has bronchitis. She is more SOB and has productive cough. Patient was started on Mucinex, Singulair and Prednisone. Patient is hopeful that these medications will help. Patient's appetite is fair. Patient said she sleeps well, on and off during the day too. Patient continues to read books and enjoys visits from family. Discussed safety and encouraged to call palliative care for any concerns. SW provided emotional support and used active and reflective listening. 3. PATIENT/CAREGIVER EDUCATION/ COPING:  Patient was alert, engaged. Patient is positive, optimistic in conversation and focused on improving. Patient reports that she is tired and weak. Family is supportive, daughter brings groceries and runs errands.  4. PERSONAL EMERGENCY PLAN:  Patient has life alert and family will call 9-1-1 for emergencies. Due to COVID-19, patient only goes out for doctor's appointments. 5. COMMUNITY RESOURCES COORDINATION/ HEALTH CARE NAVIGATION:  Patient coordinates her care. Patient  6. FINANCIAL/LEGAL CONCERNS/INTERVENTIONS:  None.     SOCIAL HX:  Social History   Tobacco Use  . Smoking status: Never Smoker  . Smokeless tobacco: Never Used  Substance Use Topics  . Alcohol use: No    CODE  STATUS:   Code Status: Prior (DNR) ADVANCED DIRECTIVES: Y MOST FORM COMPLETE:  Yes. HOSPICE EDUCATION PROVIDED: None.  PPS: Patient is independent of ADLs.Patient uses her walker or a wheelchair when she goes out of the home.  This visit was done via telephone from my office and it was initiated and consent by this patient and/or family. This was a scheduled visit.  I spent30 minutes with patient/family, from9:30-10:00aproviding education, support and consultation.  Margaretmary Lombard, LCSW

## 2019-03-04 ENCOUNTER — Other Ambulatory Visit: Payer: Self-pay | Admitting: Cardiovascular Disease

## 2019-03-08 ENCOUNTER — Other Ambulatory Visit: Payer: Self-pay

## 2019-03-08 ENCOUNTER — Encounter (INDEPENDENT_AMBULATORY_CARE_PROVIDER_SITE_OTHER): Payer: Self-pay | Admitting: Vascular Surgery

## 2019-03-08 ENCOUNTER — Ambulatory Visit: Payer: Medicare Other | Admitting: Cardiovascular Disease

## 2019-03-08 ENCOUNTER — Ambulatory Visit (INDEPENDENT_AMBULATORY_CARE_PROVIDER_SITE_OTHER): Payer: Medicare Other | Admitting: Vascular Surgery

## 2019-03-08 VITALS — BP 150/64 | HR 77 | Resp 20 | Ht 62.0 in | Wt 184.0 lb

## 2019-03-08 DIAGNOSIS — I150 Renovascular hypertension: Secondary | ICD-10-CM

## 2019-03-08 DIAGNOSIS — I701 Atherosclerosis of renal artery: Secondary | ICD-10-CM | POA: Diagnosis not present

## 2019-03-08 DIAGNOSIS — E1165 Type 2 diabetes mellitus with hyperglycemia: Secondary | ICD-10-CM | POA: Diagnosis not present

## 2019-03-08 NOTE — Assessment & Plan Note (Signed)
Symptoms improved after stent placement.  I told her to discuss reduction of some of her blood pressure medicines with her primary care physician.

## 2019-03-08 NOTE — Progress Notes (Signed)
MRN : [id number]  Amy [name] is a 86 y.o. ([birth year]) female who presents with chief complaint of  Chief Complaint  Patient presents with  . Follow-up  .  History of Present Illness: Patient returns today in follow up of her renal artery stenosis and renovascular hypertension.  About a month ago, she underwent left renal artery stent placement for high-grade stenosis with severe hypertension.  Her blood pressure has been doing better since the procedure.  She developed some sciatica symptoms in her right leg after the procedure but these have largely resolved.  Her access site is well-healed.  She has no other complaints today.  She is scheduled to see her primary care physician next month and asked me about coming off some of her blood pressure medicines.  Current Outpatient Medications  Medication Sig Dispense Refill  . albuterol (PROVENTIL HFA;VENTOLIN HFA) 108 (90 Base) MCG/ACT inhaler Inhale 2 puffs into the lungs every 6 (six) hours as needed for wheezing or shortness of breath.    . cloNIDine (CATAPRES - DOSED IN MG/24 HR) 0.2 mg/24hr patch Place 1 patch (0.2 mg total) onto the skin once a week. 4 patch 12  . clopidogrel (PLAVIX) 75 MG tablet Take 1 tablet (75 mg total) by mouth daily. 90 tablet 3  . docusate sodium (COLACE) 100 MG capsule Take 100 mg by mouth daily as needed for mild constipation. Takes daily    . doxazosin (CARDURA) 8 MG tablet     . erythromycin ophthalmic ointment     . furosemide (LASIX) 20 MG tablet Take 1 tablet (20 mg) by mouth once daily as needed for weight gain/ shortness of breath/ swelling    . hydrALAZINE (APRESOLINE) 25 MG tablet Take 1 tablet (25 mg) by mouth two times a day. 180 tablet 2  . ipratropium-albuterol (DUONEB) 0.5-2.5 (3) MG/3ML SOLN Take 3 mLs by nebulization every 4 (four) hours as needed (shortness of breath/wheezing.). 360 mL   . levothyroxine (SYNTHROID, LEVOTHROID) 125 MCG tablet Take 125 mcg by mouth daily.    Marland Kitchen lisinopril  (ZESTRIL) 10 MG tablet TAKE 1 TABLET DAILY 90 tablet 0  . metFORMIN (GLUCOPHAGE) 1000 MG tablet Take 1,000 mg by mouth 2 (two) times daily with a meal.    . oxybutynin (DITROPAN-XL) 5 MG 24 hr tablet Take 5 mg by mouth daily.     . pantoprazole (PROTONIX) 20 MG tablet Take 20 mg by mouth daily.    . simvastatin (ZOCOR) 40 MG tablet Take 1 tablet (40 mg total) by mouth at bedtime. 90 tablet 2  . SYMBICORT 160-4.5 MCG/ACT inhaler Inhale 2 puffs into the lungs 2 (two) times daily.      No current facility-administered medications for this visit.   Facility-Administered Medications Ordered in Other Visits  Medication Dose Route Frequency Provider Last Rate Last Admin  . 0.9 %  sodium chloride infusion   Intravenous Continuous Lloyd Huger, MD        Past Medical History:  Diagnosis Date  . Bladder incontinence   . CAD (coronary artery disease)    a. 05/2009 Cath: LAD 90p (Xience 2.75 x 12 mm DES), 1m, D1 40, LCx 40p/m, RCA 30/40/30p/m, RCA 30/25d;  b.  12/2011 Lexiscan MV: no ischemia, breast attenuation artifact, normal EF-->Low risk; c. 10/2016 MV: fixed apical defect, most likely apical thinning and attenuation, No ischemia, EF 60%.  . Cancer ([place])    ovarian  . Carotid arterial disease w/ R Carotid Bruit (HCC)  a. 08/2016 Carotid U/S: 40-59% bilat ICA stenosis - f/u 1 yr.  . Chronic diastolic (congestive) heart failure ([place])    a. Echo 09/2015: EF 55-60% w/ Grade 1 DD, sev Ca2+ MV annulus, mildly dil LA; b. 09/2016 Echo: EF 65-70%, Gr2 DD, mildly dil LA/RA, nl RV fxn.  . Chronic Dyspnea on exertion   . CKD (chronic kidney disease) stage 3, GFR 30-59 ml/min   . Degenerative arthritis of knee    bilateral knees  . Diabetes mellitus    Type II  . GIB (gastrointestinal bleeding)    a. 08/2016 Admission w/ presyncope/anemia/melena-->req Transfusion-->endo ok, colonoscopy w/ polyps but no source of bleeding (most likely diverticular).  . Hiatal hernia   . Hypertension   . Iron  deficiency   . Menopausal symptoms   . Morbid obesity ([place])   . Renal insufficiency   . Thyroid disease    hypothyroidism    Past Surgical History:  Procedure Laterality Date  . COLONOSCOPY  2015  . COLONOSCOPY WITH PROPOFOL N/A 09/25/2016   Procedure: COLONOSCOPY WITH PROPOFOL;  Surgeon: [name], MD;  Location: [facility] ENDOSCOPY;  Service: Gastroenterology;  Laterality: N/A;  . COLONOSCOPY WITH PROPOFOL N/A 09/26/2016   Procedure: COLONOSCOPY WITH PROPOFOL;  Surgeon: [name], MD;  Location: [facility] ENDOSCOPY;  Service: Gastroenterology;  Laterality: N/A;  . ESOPHAGOGASTRODUODENOSCOPY (EGD) WITH PROPOFOL N/A 09/25/2016   Procedure: ESOPHAGOGASTRODUODENOSCOPY (EGD) WITH PROPOFOL;  Surgeon: [name], MD;  Location: [facility] ENDOSCOPY;  Service: Gastroenterology;  Laterality: N/A;  . RENAL ANGIOGRAPHY N/A 02/07/2019   Procedure: RENAL ANGIOGRAPHY;  Surgeon: [name], MD;  Location: [facility] CV LAB;  Service: Cardiovascular;  Laterality: N/A;  . REPLACEMENT TOTAL KNEE BILATERAL    . TOTAL VAGINAL HYSTERECTOMY     ovarian mass, not cancerous  . UPPER GI ENDOSCOPY  2015    Social History Social History   Tobacco Use  . Smoking status: Never Smoker  . Smokeless tobacco: Never Used  Substance Use Topics  . Alcohol use: No  . Drug use: No     Family History  Problem Relation Age of Onset  . Breast cancer Mother 94     Allergies  Allergen Reactions  . Losartan Other (See Comments)    Hyperkalemia  . Morphine And Related Shortness Of Breath    Rash, difficulty breathing, nausea.   . Atenolol Other (See Comments)    Other reaction(s): Other (See Comments) Decreased heart rate Decreased heart rate  . Codeine     Rash, difficulty breathing, nausea.  . Nsaids Other (See Comments)    Other reaction(s): Unknown  . Rofecoxib     Other reaction(s): Unknown  . Sucralfate Other (See Comments)    Throat tightness  . Sulfa Antibiotics     Other reaction(s): Unknown      REVIEW OF SYSTEMS (Negative unless checked)  Constitutional: [] Weight loss  [] Fever  [] Chills Cardiac: [] Chest pain   [] Chest pressure   [] Palpitations   [] Shortness of breath when laying flat   [] Shortness of breath at rest   [] Shortness of breath with exertion. Vascular:  [] Pain in legs with walking   [] Pain in legs at rest   [] Pain in legs when laying flat   [] Claudication   [] Pain in feet when walking  [] Pain in feet at rest  [] Pain in feet when laying flat   [] History of DVT   [] Phlebitis   [] Swelling in legs   [] Varicose veins   [] Non-healing ulcers Pulmonary:   [] Uses home  oxygen   [] Productive cough   [] Hemoptysis   [] Wheeze  [] COPD   [] Asthma Neurologic:  [] Dizziness  [] Blackouts   [] Seizures   [] History of stroke   [] History of TIA  [] Aphasia   [] Temporary blindness   [] Dysphagia   [] Weakness or numbness in arms   [] Weakness or numbness in legs Musculoskeletal:  [x] Arthritis   [] Joint swelling   [] Joint pain   [] Low back pain Hematologic:  [] Easy bruising  [] Easy bleeding   [] Hypercoagulable state   [] Anemic   Gastrointestinal:  [] Blood in stool   [] Vomiting blood  [x] Gastroesophageal reflux/heartburn   [] Abdominal pain Genitourinary:  [x] Chronic kidney disease   [] Difficult urination  [x] Frequent urination  [] Burning with urination   [] Hematuria Skin:  [] Rashes   [] Ulcers   [] Wounds Psychological:  [] History of anxiety   []  History of major depression.  Physical Examination  BP (!) 150/64 (BP Location: Left Arm)   Pulse 77   Resp 20   Ht 5\' 2"  (1.575 m)   Wt 184 lb (83.5 kg)   BMI 33.65 kg/m  Gen:  WD/WN, NAD Head: [place]/AT, No temporalis wasting. Ear/Nose/Throat: Hearing grossly intact, nares w/o erythema or drainage Eyes: Conjunctiva clear. Sclera non-icteric Neck: Supple.  Trachea midline Pulmonary:  Good air movement, no use of accessory muscles.  Cardiac: irregular Vascular:  Vessel Right Left  Radial Palpable Palpable                                    Gastrointestinal: soft, non-tender/non-distended. No guarding/reflex.  Musculoskeletal: M/S 5/5 throughout.  No deformity or atrophy. Trace LE edema. In a wheelchair. Neurologic: Sensation grossly intact in extremities.  Symmetrical.  Speech is fluent.  Psychiatric: Judgment intact, Mood & affect appropriate for pt's clinical situation. Dermatologic: No rashes or ulcers noted.  No cellulitis or open wounds.       Labs Recent Results (from the past 2160 hour(s))  Iron and TIBC     Status: Abnormal   Collection Time: 12/13/18 10:35 AM  Result Value Ref Range   Iron 76 28 - 170 ug/dL   TIBC 208 (L) 250 - 450 ug/dL   Saturation Ratios 37 (H) 10.4 - 31.8 %   UIBC 132 ug/dL    Comment: Performed at [facility], [address] [place], [place] [address]  Hold Tube- Blood Bank     Status: None   Collection Time: 12/13/18 10:35 AM  Result Value Ref Range   Blood Bank Specimen SAMPLE AVAILABLE FOR TESTING    Sample Expiration      12/16/2018,2359 Performed at [facility], [address] [place], [place] [address]   Ferritin     Status: None   Collection Time: 12/13/18 10:35 AM  Result Value Ref Range   Ferritin 195 11 - 307 ng/mL    Comment: Performed at [facility], [place], [address] [place], [place] [address]  CBC with Differential/Platelet     Status: Abnormal   Collection Time: 12/13/18 10:35 AM  Result Value Ref Range   WBC 4.9 4.0 - 10.5 K/uL   RBC 2.95 (L) 3.87 - 5.11 MIL/uL   Hemoglobin 9.4 (L) 12.0 - 15.0 g/dL   HCT 28.8 (L) 36.0 - 46.0 %   MCV 97.6 80.0 - 100.0 fL   MCH 31.9 26.0 - 34.0 pg   MCHC 32.6 30.0 - 36.0 g/dL   RDW 12.6 11.5 - 15.5 %  Platelets 165 150 - 400 K/uL   nRBC 0.0 0.0 - 0.2 %   Neutrophils Relative % 62 %   Neutro Abs 3.1 1.7 - 7.7 K/uL   Lymphocytes Relative 24 %   Lymphs Abs 1.2 0.7 - 4.0 K/uL   Monocytes Relative 11 %   Monocytes Absolute 0.5 0.1 - 1.0 K/uL   Eosinophils Relative 3 %   Eosinophils  Absolute 0.2 0.0 - 0.5 K/uL   Basophils Relative 0 %   Basophils Absolute 0.0 0.0 - 0.1 K/uL   Immature Granulocytes 0 %   Abs Immature Granulocytes 0.01 0.00 - 0.07 K/uL    Comment: Performed at [facility], [place], [place] [address]  SARS CORONAVIRUS 2 (TAT 6-24 HRS) Nasopharyngeal Nasopharyngeal Swab     Status: None   Collection Time: 02/04/19  1:32 PM   Specimen: Nasopharyngeal Swab  Result Value Ref Range   SARS Coronavirus 2 NEGATIVE NEGATIVE    Comment: (NOTE) SARS-CoV-2 target nucleic acids are NOT DETECTED. The SARS-CoV-2 RNA is generally detectable in upper and lower respiratory specimens during the acute phase of infection. Negative results do not preclude SARS-CoV-2 infection, do not rule out co-infections with other pathogens, and should not be used as the sole basis for treatment or other patient management decisions. Negative results must be combined with clinical observations, patient history, and epidemiological information. The expected result is Negative. Fact Sheet for Patients: SugarRoll.be Fact Sheet for Healthcare Providers: https://www.woods-mathews.com/ This test is not yet approved or cleared by the [place] FDA and  has been authorized for detection and/or diagnosis of SARS-CoV-2 by FDA under an Emergency Use Authorization (EUA). This EUA will remain  in effect (meaning this test can be used) for the duration of the COVID-19 declaration under Section 56 4(b)(1) of the Act, 21 U.S.C. section 360bbb-3(b)(1), unless the authorization is terminated or revoked sooner. Performed at [facility], [place] [address], [place], [place] [address]   BUN     Status: Abnormal   Collection Time: 02/07/19  7:44 AM  Result Value Ref Range   BUN 39 (H) 8 - 23 mg/dL    Comment: Performed at [facility], [address] [place], [place] [address]  Creatinine, serum     Status: Abnormal    Collection Time: 02/07/19  7:44 AM  Result Value Ref Range   Creatinine, Ser 2.01 (H) 0.44 - 1.00 mg/dL   GFR calc non Af Amer 21 (L) >60 mL/min   GFR calc Af Amer 25 (L) >60 mL/min    Comment: Performed at [facility], [address] [place],  [address]  Glucose, capillary     Status: Abnormal   Collection Time: 02/07/19  7:49 AM  Result Value Ref Range   Glucose-Capillary 109 (H) 70 - 99 mg/dL  Glucose, capillary     Status: Abnormal   Collection Time: 02/07/19 11:17 AM  Result Value Ref Range   Glucose-Capillary 145 (H) 70 - 99 mg/dL    Radiology PERIPHERAL VASCULAR CATHETERIZATION  Result Date: 02/07/2019 See op note  US Venous Img Lower Unilateral Right (DVT)  Result Date: 02/10/2019 CLINICAL DATA:  Right leg pain for 4 days EXAM: RIGHT LOWER EXTREMITY VENOUS DOPPLER ULTRASOUND TECHNIQUE: Gray-scale sonography with graded compression, as well as color Doppler and duplex ultrasound were performed to evaluate the lower extremity deep venous systems from the level of the common femoral vein and including the common femoral, femoral, profunda femoral, popliteal and  calf veins including the posterior tibial, peroneal and gastrocnemius veins when visible. The superficial great saphenous vein was also interrogated. Spectral Doppler was utilized to evaluate flow at rest and with distal augmentation maneuvers in the common femoral, femoral and popliteal veins. COMPARISON:  None. FINDINGS: Contralateral Common Femoral Vein: Respiratory phasicity is normal and symmetric with the symptomatic side. No evidence of thrombus. Normal compressibility. Common Femoral Vein: No evidence of thrombus. Normal compressibility, respiratory phasicity and response to augmentation. Saphenofemoral Junction: No evidence of thrombus. Normal compressibility and flow on color Doppler imaging. Profunda Femoral Vein: No evidence of thrombus. Normal compressibility and flow on color Doppler imaging.  Femoral Vein: No evidence of thrombus. Normal compressibility, respiratory phasicity and response to augmentation. Popliteal Vein: No evidence of thrombus. Normal compressibility, respiratory phasicity and response to augmentation. Calf Veins: No evidence of thrombus. Normal compressibility and flow on color Doppler imaging. IMPRESSION: No evidence of deep venous thrombosis. Electronically Signed   By: Jerilynn Mages.  Shick M.D.   On: 02/10/2019 16:42    Assessment/Plan  Type 2 diabetes mellitus with hyperglycemia (HCC) blood glucose control important in reducing the progression of atherosclerotic disease. Also, involved in wound healing. On appropriate medications.   Renovascular hypertension Symptoms improved after stent placement.  I told her to discuss reduction of some of her blood pressure medicines with her primary care physician.  Renal artery stenosis (HCC) Status post left renal artery stent placement for high-grade stenosis.  Plan to check a duplex in 2 to 3 months.  If her blood pressure is improved, she may be able to back off of some of her medications but I will defer this to her primary care physician.    Leotis Pain, MD  03/08/2019 11:38 AM    This note was created with Dragon medical transcription system.  Any errors from dictation are purely unintentional

## 2019-03-08 NOTE — Assessment & Plan Note (Signed)
blood glucose control important in reducing the progression of atherosclerotic disease. Also, involved in wound healing. On appropriate medications.  

## 2019-03-08 NOTE — Patient Instructions (Signed)
Renal Artery Stenosis Renal artery stenosis (RAS) is a narrowing of the artery that carries blood to the kidneys. It can affect one or both kidneys. The kidneys filter waste and extra fluid from the blood. Waste and fluid are then removed when a person passes urine. The kidneys also make an important chemical messenger (hormone) called renin. Renin helps regulate blood pressure. The first sign of RAS may be high blood pressure. Other symptoms can develop over time. What are the causes? A common cause of this condition is plaque buildup in your arteries (atherosclerosis). The plaques that cause this are made up of:  Fat.  Cholesterol.  Calcium.  Other substances. As these substances build up in your renal artery, the blood supply to your kidneys slows. The lack of blood and oxygen causes the signs and symptoms of RAS. A much less common cause of RAS is a disease called fibromuscular dysplasia. This disease causes abnormal cell growth that narrows the renal artery. It is not related to atherosclerosis. It occurs mostly in women who are 74-38 years old. It may be passed down through families.  What increases the risk? You are more likely to develop this condition if you:  Are a man who is at least 84 years old.  Are a woman who is at least 84 years old.  Have high blood pressure.  Have high cholesterol.  Are a smoker.  Abuse alcohol.  Have diabetes or prediabetes.  Are overweight or obese.  Have a family history of early heart disease. What are the signs or symptoms? RAS usually develops slowly. You may not have any signs or symptoms at first. Early signs may include:  Development of high blood pressure.  A sudden increase in existing high blood pressure.  No longer responding to medicine that used to control your blood pressure. Later signs and symptoms are due to kidney damage. They may include:  Feeling tired (fatigue).  Shortness of breath.  Swollen legs and  feet.  Dry skin.  Headaches.  Muscle cramps.  Loss of appetite.  Nausea or vomiting. How is this diagnosed? This condition may be diagnosed based on:  Your symptoms and medical history. Your health care provider may suspect RAS based on changes in your blood pressure and your risk factors.  A physical exam. During the exam, your health care provider will use a stethoscope to listen for a whooshing sound (bruit) that can occur where the renal artery is blocking blood flow.  Various tests. These may include: ? Blood and urine tests to check your kidney function. ? Imaging tests of your kidneys, such as:  A test that uses sound waves to create an image of your kidneys and the blood flow to your kidneys (ultrasound).  A test in which dye is injected into one of your blood vessels so images can be taken as the dye flows through your renal arteries (angiogram). This can be done using X-rays, a CT scan (computed tomography angiogram, CTA), or a type of MRI (magnetic resonance angiogram, MRA). How is this treated? Making lifestyle changes to reduce your risk factors is the first treatment option for early RAS. If the blood flow to one of your kidneys is cut by more than half, you may need medicine to:  Lower your blood pressure. This is the main medical treatment for RAS. You may need more than one type of medicine for this. The types that work best for people with RAS are: ? ACE inhibitors. ? Angiotensin receptor  blockers.  Reduce fluid in the body (diuretics).  Lower your cholesterol (statins). If medicine is not enough to control RAS, you may need surgery. This may involve:  Threading a tube with an inflatable balloon into the renal artery to force it to open (angioplasty).  Removing plaque from inside the artery (endarterectomy). Follow these instructions at home:  Lifestyle  Make any lifestyle changes recommended by your health care provider. This may include: ? Working with  a dietitian to maintain a heart-healthy diet. This type of diet is low in saturated fat, salt, and added sugar. ? Starting an exercise program as directed by your health care provider. ? Maintaining a healthy weight. ? Quitting smoking. ? Not abusing alcohol. General instructions  Take over-the-counter and prescription medicines only as told by your health care provider.  Keep all follow-up visits as told by your health care provider. This is important. Contact a health care provider if:  Your symptoms of RAS are not getting better.  Your symptoms are changing or getting worse. Get help right away if you have:  Very bad pain in your back or abdomen.  Blood in your urine. Summary  Renal artery stenosis (RAS) is a narrowing of the artery that carries blood to the kidneys. It can affect one or both kidneys.  RAS usually develops slowly. You may not have any signs or symptoms at first, but high blood pressure that is difficult to control is a key symptom.  Making lifestyle changes to reduce your risk factors is the first treatment option for early RAS. If the blood flow to one of your kidneys is cut by more than half, you may need medicines to help manage your cholesterol and blood pressure. This information is not intended to replace advice given to you by your health care provider. Make sure you discuss any questions you have with your health care provider. Document Revised: 03/09/2017 Document Reviewed: 03/09/2017 Elsevier Patient Education  2020 [employer].

## 2019-03-08 NOTE — Assessment & Plan Note (Signed)
Status post left renal artery stent placement for high-grade stenosis.  Plan to check a duplex in 2 to 3 months.  If her blood pressure is improved, she may be able to back off of some of her medications but I will defer this to her primary care physician.

## 2019-03-14 NOTE — Progress Notes (Signed)
Cardiology Office Note  Date:  03/15/2019   ID:  Amy [name], DOB 01-29-1928, MRN [id number]  PCP:  [name], MD   Chief Complaint  Patient presents with  . other    6 month follow up. Meds reviewed by the pt. verbally. Pt. c/o shortness of breath with LE edema. Pt. also needs to know if she should be taking the Doxazosin.      HPI:  Amy [name] is a pleasant 86 y.o. woman with a PMHx of:  Coronary Artery Disease  Proximal LAD with stent  Incontinence  COPD exacerbations Chronic baseline shortness of breath Morbid obesity due to long history of poor diet Beta blocker has been held in the past secondary to bradycardia.  Off losartan secondary to hyperkalemia, was on HCTZ who now presents for routine follow up of her Coronary Artery Disease  Blood pressure was running high Renal stent placed, dec 14th 2020 BP better Needs refills today 140/50  Lasix QOD  Renal function getting worse over the past year CR 2.01 in dec 14 No repeat since that time  Other lab work reviewed with her Total chol 156 ldl 74, in 2018  Still with some chest congestion Trace leg edema L>R  EKG personally reviewed by myself on todays visit  shows normal sinus rhythm RBBB rate 71 beats per minute  OTHER PAST MEDICAL HISTORY REVIEWED BY ME AT TODAY'S VISIT:  DATE TEST EF   09/2016 Echo  65-70 %   09/2015 Echo  55-60 %   02/2015 Echo 60-65% mild AS, LVH   2019 09/02/2017 EKG showed normal sinus rhythm with rate 74 bpm right bundle branch block hospitalization March  2019 Sepsis due to pneumonia, upper GI bleed melena, acute on chronic diastolic CHF progressive shortness of breath with melena on arrival, pneumonia, treated with antibiotics pleural effusion felt secondary to CHF Asa plavix held prbc x1 HCT 25.7 D/c on plavix with no asa  Stress test 03/2017 Pharmacological myocardial perfusion imaging study with no significant Ischemia Small region of fixed apical  thinning, consistent with attenuation artifact GI uptake artifact noted Normal wall motion, EF estimated at 56% No EKG changes concerning for ischemia at peak stress or in recovery. Low risk scan  2017 hospitalization mid August 2017 COPD exacerbation, required steroids, DuoNeb's, flutter device, antibiotics CT scan showed mucus plugging, postobstructive atelectasis  2015  [facility] August 2015. She had diarrhea, vomiting, felt to be viral also with acute renal failure that improved with hydration. Creatinine reached 1.86, one month later creatinine 1.0 as an outpatient Continues to be weak, does not walk much and is deconditioned  Guaiac positive and she had colonoscopy and EGD. She reports Dr. Vira Agar did not find anything Iron transfusions in the past under the direction of Dr. Ma Hillock.   Prior episode of shortness of breath while walking in the hospital to schedule a colonoscopy appointment. sent to the emergency room for further evaluation. Systolic pressure was in the 170 range. workup at that time was essentially normal and she was discharged home.  2013 01/14/2012 Stress test showed no ischemia, Lexiscan  2011 06/22/2009 Cardiac Catheter performed at [facility] details 90% proximal LAD, 40% mid LAD, 40% diagonal, 60% proximal circumflex followed by 40% circumflex lesion, 30%, 40% and 30% lesion noted in the RCA with distal RCA with 30% and 25% lesions. Mild atheroma in the aorta. Xience 2.75 x 12 mm Drug eluting stent placed.    PMH:   has a past medical history of  Bladder incontinence, CAD (coronary artery disease), Cancer ([place]), Carotid arterial disease w/ R Carotid Bruit (HCC), Chronic diastolic (congestive) heart failure (HCC), Chronic Dyspnea on exertion, CKD (chronic kidney disease) stage 3, GFR 30-59 ml/min, Degenerative arthritis of knee, Diabetes mellitus, GIB (gastrointestinal bleeding), Hiatal hernia, Hypertension, Iron deficiency, Menopausal symptoms, Morbid obesity  ([place]), Renal insufficiency, and Thyroid disease.  PSH:    Past Surgical History:  Procedure Laterality Date  . COLONOSCOPY  2015  . COLONOSCOPY WITH PROPOFOL N/A 09/25/2016   Procedure: COLONOSCOPY WITH PROPOFOL;  Surgeon: [name], MD;  Location: [facility] ENDOSCOPY;  Service: Gastroenterology;  Laterality: N/A;  . COLONOSCOPY WITH PROPOFOL N/A 09/26/2016   Procedure: COLONOSCOPY WITH PROPOFOL;  Surgeon: [name], MD;  Location: [facility] ENDOSCOPY;  Service: Gastroenterology;  Laterality: N/A;  . ESOPHAGOGASTRODUODENOSCOPY (EGD) WITH PROPOFOL N/A 09/25/2016   Procedure: ESOPHAGOGASTRODUODENOSCOPY (EGD) WITH PROPOFOL;  Surgeon: [name], MD;  Location: [facility] ENDOSCOPY;  Service: Gastroenterology;  Laterality: N/A;  . RENAL ANGIOGRAPHY N/A 02/07/2019   Procedure: RENAL ANGIOGRAPHY;  Surgeon: [name], MD;  Location: [place] CV LAB;  Service: Cardiovascular;  Laterality: N/A;  . REPLACEMENT TOTAL KNEE BILATERAL    . TOTAL VAGINAL HYSTERECTOMY     ovarian mass, not cancerous  . UPPER GI ENDOSCOPY  2015    Current Outpatient Medications  Medication Sig Dispense Refill  . albuterol (PROVENTIL HFA;VENTOLIN HFA) 108 (90 Base) MCG/ACT inhaler Inhale 2 puffs into the lungs every 6 (six) hours as needed for wheezing or shortness of breath.    . cloNIDine (CATAPRES - DOSED IN MG/24 HR) 0.2 mg/24hr patch Place 1 patch (0.2 mg total) onto the skin once a week. 4 patch 12  . clopidogrel (PLAVIX) 75 MG tablet Take 1 tablet (75 mg total) by mouth daily. 90 tablet 3  . docusate sodium (COLACE) 100 MG capsule Take 100 mg by mouth daily as needed for mild constipation. Takes daily    . doxazosin (CARDURA) 8 MG tablet Take 8 mg by mouth daily.     Marland Kitchen erythromycin ophthalmic ointment     . furosemide (LASIX) 20 MG tablet Take 1 tablet (20 mg) by mouth once daily as needed for weight gain/ shortness of breath/ swelling    . hydrALAZINE (APRESOLINE) 25 MG tablet Take 1 tablet (25 mg) by mouth two times a  day. 180 tablet 2  . ipratropium-albuterol (DUONEB) 0.5-2.5 (3) MG/3ML SOLN Take 3 mLs by nebulization every 4 (four) hours as needed (shortness of breath/wheezing.). 360 mL   . levothyroxine (SYNTHROID, LEVOTHROID) 125 MCG tablet Take 125 mcg by mouth daily.    Marland Kitchen lisinopril (ZESTRIL) 10 MG tablet TAKE 1 TABLET DAILY 90 tablet 0  . metFORMIN (GLUCOPHAGE) 1000 MG tablet Take 1,000 mg by mouth 2 (two) times daily with a meal.    . oxybutynin (DITROPAN-XL) 5 MG 24 hr tablet Take 5 mg by mouth daily.     . pantoprazole (PROTONIX) 20 MG tablet Take 20 mg by mouth daily.    . simvastatin (ZOCOR) 40 MG tablet Take 1 tablet (40 mg total) by mouth at bedtime. 90 tablet 2  . SYMBICORT 160-4.5 MCG/ACT inhaler Inhale 2 puffs into the lungs 2 (two) times daily.      No current facility-administered medications for this visit.   Facility-Administered Medications Ordered in Other Visits  Medication Dose Route Frequency Provider Last Rate Last Admin  . 0.9 %  sodium chloride infusion   Intravenous Continuous Lloyd Huger, MD  Allergies:   Losartan, Morphine and related, Atenolol, Codeine, Nsaids, Rofecoxib, Sucralfate, and Sulfa antibiotics   Social History:  The patient  reports that she has never smoked. She has never used smokeless tobacco. She reports that she does not drink alcohol or use drugs.   Family History:   family history includes Breast cancer (age of onset: 31) in her mother.    Review of Systems: Review of Systems  Constitutional: Negative.   HENT: Negative.   Respiratory: Positive for shortness of breath.   Cardiovascular: Positive for leg swelling.  Gastrointestinal: Negative.   Musculoskeletal: Negative.        Gait instability  Neurological: Negative.   Psychiatric/Behavioral: Negative.   All other systems reviewed and are negative.   PHYSICAL EXAM: VS:  BP (!) 140/50 (BP Location: Left Arm, Patient Position: Sitting, Cuff Size: Normal)   Pulse 71   Ht 5'  2" (1.575 m)   Wt 176 lb 4 oz (79.9 kg)   BMI 32.24 kg/m  , BMI Body mass index is 32.24 kg/m.  Constitutional:  oriented to person, place, and time. No distress.  Obese HENT:  Head: Grossly normal Eyes:  no discharge. No scleral icterus.  Neck: No JVD, no carotid bruits  Cardiovascular: Regular rate and rhythm, no murmurs appreciated Trace edema L>R Pulmonary/Chest: Clear to auscultation bilaterally, no wheezes or rails Abdominal: Soft.  no distension.  no tenderness.  Musculoskeletal: Normal range of motion Neurological:  normal muscle tone. Coordination normal. No atrophy Skin: Skin warm and dry Psychiatric: normal affect, pleasant   Recent Labs: 12/13/2018: Hemoglobin 9.4; Platelets 165 02/07/2019: BUN 39; Creatinine, Ser 2.01    Lipid Panel Lab Results  Component Value Date   CHOL 135 06/25/2010   HDL 35 (L) 06/25/2010   LDLCALC 56 06/25/2010   TRIG 220 (H) 06/25/2010      Wt Readings from Last 3 Encounters:  03/15/19 176 lb 4 oz (79.9 kg)  03/15/19 170 lb (77.1 kg)  03/08/19 184 lb (83.5 kg)       ASSESSMENT AND PLAN:  SOB (shortness of breath) Baseline deconditioning, anemia, HTN, COPD, diastolic CHF Deconditioned Recent URI treated with ABX/prednisone Will need to monitor closely to confirm no worsening heart failure Hesitant to increase Lasix given worsening renal failure  Coronary artery disease of native artery of native heart with stable angina pectoris (HCC)  Currently with no symptoms of angina. No further workup at this time. Continue current medication regimen.  Essential hypertension  Blood pressure is well controlled on today's visit. No changes made to the medications.  Chronic diastolic CHF (congestive heart failure) (HCC) Lasix QOD Recheck BMP today, CR 2.0 in dec 2020 May need Lasix daily if renal function improving  Paroxysmal atrial fibrillation (HCC)  Fall risk, In NSR  COPD exacerbation (HCC) Albuterol BID  Secondhand  smoke exposure Also nebulizer  Acute renal failure superimposed on stage 3 chronic kidney disease, unspecified acute renal failure type ([place]) Recent renal stent, recheck BMP  Anemia, unspecified type followed by hematology Dr. Grayland Ormond  Total encounter time more than 25 minutes. Greater than 50% was spent in counseling and coordination of care with the patient.  Disposition:   F/U  6 months   Signed, Esmond Plants, M.D., Ph.D. 03/15/2019  [place], [place]

## 2019-03-15 ENCOUNTER — Ambulatory Visit
Admission: RE | Admit: 2019-03-15 | Discharge: 2019-03-15 | Disposition: A | Discharge status: Home / Self Care | Payer: Medicare Other | Source: Ambulatory Visit | Attending: Urology | Admitting: Urology

## 2019-03-15 ENCOUNTER — Ambulatory Visit (INDEPENDENT_AMBULATORY_CARE_PROVIDER_SITE_OTHER): Payer: Medicare Other | Admitting: Urology

## 2019-03-15 ENCOUNTER — Ambulatory Visit (INDEPENDENT_AMBULATORY_CARE_PROVIDER_SITE_OTHER): Payer: Medicare Other | Admitting: Cardiovascular Disease

## 2019-03-15 ENCOUNTER — Other Ambulatory Visit: Payer: Self-pay

## 2019-03-15 ENCOUNTER — Encounter: Payer: Self-pay | Admitting: Cardiovascular Disease

## 2019-03-15 ENCOUNTER — Encounter: Payer: Self-pay | Admitting: Urology

## 2019-03-15 VITALS — BP 142/88 | HR 75 | Ht 62.0 in | Wt 170.0 lb

## 2019-03-15 VITALS — BP 140/50 | HR 71 | Ht 62.0 in | Wt 176.2 lb

## 2019-03-15 DIAGNOSIS — I701 Atherosclerosis of renal artery: Secondary | ICD-10-CM | POA: Diagnosis not present

## 2019-03-15 DIAGNOSIS — I480 Paroxysmal atrial fibrillation: Secondary | ICD-10-CM | POA: Diagnosis not present

## 2019-03-15 DIAGNOSIS — N200 Calculus of kidney: Secondary | ICD-10-CM | POA: Diagnosis present

## 2019-03-15 DIAGNOSIS — N189 Chronic kidney disease, unspecified: Secondary | ICD-10-CM

## 2019-03-15 DIAGNOSIS — I25118 Atherosclerotic heart disease of native coronary artery with other forms of angina pectoris: Secondary | ICD-10-CM | POA: Diagnosis not present

## 2019-03-15 DIAGNOSIS — I5032 Chronic diastolic (congestive) heart failure: Secondary | ICD-10-CM | POA: Diagnosis not present

## 2019-03-15 DIAGNOSIS — N183 Chronic kidney disease, stage 3 unspecified: Secondary | ICD-10-CM

## 2019-03-15 DIAGNOSIS — E1122 Type 2 diabetes mellitus with diabetic chronic kidney disease: Secondary | ICD-10-CM | POA: Diagnosis not present

## 2019-03-15 DIAGNOSIS — I10 Essential (primary) hypertension: Secondary | ICD-10-CM

## 2019-03-15 DIAGNOSIS — D649 Anemia, unspecified: Secondary | ICD-10-CM

## 2019-03-15 DIAGNOSIS — E782 Mixed hyperlipidemia: Secondary | ICD-10-CM

## 2019-03-15 LAB — URINALYSIS, COMPLETE
Bilirubin, UA: NEGATIVE
Glucose, UA: NEGATIVE
Ketones, UA: NEGATIVE
Leukocytes,UA: NEGATIVE
Nitrite, UA: NEGATIVE
RBC, UA: NEGATIVE
Specific Gravity, UA: 1.015 (ref 1.005–1.030)
Urobilinogen, Ur: 0.2 mg/dL (ref 0.2–1.0)
pH, UA: 5.5 (ref 5.0–7.5)

## 2019-03-15 LAB — MICROSCOPIC EXAMINATION: RBC, Urine: NONE SEEN /hpf (ref 0–2)

## 2019-03-15 MED ORDER — DOXAZOSIN MESYLATE 8 MG PO TABS: 8.0000 mg | ORAL_TABLET | Freq: Every day | ORAL | 3 refills | Status: DC

## 2019-03-15 MED ORDER — LISINOPRIL 10 MG PO TABS: 10.0000 mg | ORAL_TABLET | Freq: Every day | ORAL | 3 refills | Status: DC

## 2019-03-15 NOTE — Patient Instructions (Addendum)
Medication Instructions:  Refills on lisinopril, cardura  If you need a refill on your cardiac medications before your next appointment, please call your pharmacy.    Lab work: [employer] today   If you have labs (blood work) drawn today and your tests are completely normal, you will receive your results only by: Marland Kitchen MyChart Message (if you have MyChart) OR . A paper copy in the mail If you have any lab test that is abnormal or we need to change your treatment, we will call you to review the results.   Testing/Procedures: No new testing needed   Follow-Up: At [facility], you and your health needs are our priority.  As part of our continuing mission to provide you with exceptional heart care, we have created designated Provider Care Teams.  These Care Teams include your primary Cardiologist (physician) and Advanced Practice Providers (APPs -  Physician Assistants and Nurse Practitioners) who all work together to provide you with the care you need, when you need it.  . You will need a follow up appointment in 12 months .   Please call our office 2 months in advance to schedule this appointment.    . Providers on your designated Care Team:   . Murray Hodgkins, NP . Christell Faith, PA-C . Marrianne Mood, PA-C  Any Other Special Instructions Will Be Listed Below (If Applicable).  For educational health videos Log in to : [email or web address] Or : [email or web address], password : [email or web address]

## 2019-03-15 NOTE — Progress Notes (Signed)
03/15/2019 9:31 AM   Amy [name] Feb 24, 1928 [id number]  Referring provider: [name], MD [name],  [name] [address]  Chief Complaint  Patient presents with  . Nephrolithiasis    HPI: 86 year old female with a history of chronic renal insufficiency /renal artery stenosis referred for further evaluation of incidental kidney stone on renal ultrasound.  Incidental on millimeter cluster stones identified on renal ultrasound from 12/2018 in the left upper pole.  These were not previously appreciated on renal ultrasound a few years ago.  She denies presence of kidney stones.  She denies any dysuria or gross hematuria.  She denies any history of flank pain.  She occasionally has some mild discomfort in her left lower quadrant which comes and goes and is not severe.  No alleviating or exacerbating factors.  She does report that she become more sedentary over the past several years.  She turns 89 in 2 days from now.  She drinks plenty of water.  She does not add any additional salt to her diet.  She does enjoy lemons and lines.  She does not eat leafy greens.  She is status post recent left renal artery stenosis stent.  Her blood pressure has been improving since then.  She says no other issues.    PMH: Past Medical History:  Diagnosis Date  . Bladder incontinence   . CAD (coronary artery disease)    a. 05/2009 Cath: LAD 90p (Xience 2.75 x 12 mm DES), 12m, D1 40, LCx 40p/m, RCA 30/40/30p/m, RCA 30/25d;  b.  12/2011 Lexiscan MV: no ischemia, breast attenuation artifact, normal EF-->Low risk; c. 10/2016 MV: fixed apical defect, most likely apical thinning and attenuation, No ischemia, EF 60%.  . Cancer ([place])    ovarian  . Carotid arterial disease w/ R Carotid Bruit (HCC)    a. 08/2016 Carotid U/S: 40-59% bilat ICA stenosis - f/u 1 yr.  . Chronic diastolic (congestive) heart failure ([place])    a. Echo 09/2015: EF 55-60% w/ Grade 1 DD, sev Ca2+ MV annulus,  mildly dil LA; b. 09/2016 Echo: EF 65-70%, Gr2 DD, mildly dil LA/RA, nl RV fxn.  . Chronic Dyspnea on exertion   . CKD (chronic kidney disease) stage 3, GFR 30-59 ml/min   . Degenerative arthritis of knee    bilateral knees  . Diabetes mellitus    Type II  . GIB (gastrointestinal bleeding)    a. 08/2016 Admission w/ presyncope/anemia/melena-->req Transfusion-->endo ok, colonoscopy w/ polyps but no source of bleeding (most likely diverticular).  . Hiatal hernia   . Hypertension   . Iron deficiency   . Menopausal symptoms   . Morbid obesity ([name])   . Renal insufficiency   . Thyroid disease    hypothyroidism    Surgical History: Past Surgical History:  Procedure Laterality Date  . COLONOSCOPY  2015  . COLONOSCOPY WITH PROPOFOL N/A 09/25/2016   Procedure: COLONOSCOPY WITH PROPOFOL;  Surgeon: [name], MD;  Location: [facility] ENDOSCOPY;  Service: Gastroenterology;  Laterality: N/A;  . COLONOSCOPY WITH PROPOFOL N/A 09/26/2016   Procedure: COLONOSCOPY WITH PROPOFOL;  Surgeon: [name], MD;  Location: [facility] ENDOSCOPY;  Service: Gastroenterology;  Laterality: N/A;  . ESOPHAGOGASTRODUODENOSCOPY (EGD) WITH PROPOFOL N/A 09/25/2016   Procedure: ESOPHAGOGASTRODUODENOSCOPY (EGD) WITH PROPOFOL;  Surgeon: [name], MD;  Location: [facility] ENDOSCOPY;  Service: Gastroenterology;  Laterality: N/A;  . RENAL ANGIOGRAPHY N/A 02/07/2019   Procedure: RENAL ANGIOGRAPHY;  Surgeon: [name], MD;  Location: [facility] CV LAB;  Service: Cardiovascular;  Laterality: N/A;  . REPLACEMENT TOTAL KNEE BILATERAL    . TOTAL VAGINAL HYSTERECTOMY     ovarian mass, not cancerous  . UPPER GI ENDOSCOPY  2015    Home Medications:  Allergies as of 03/15/2019      Reactions   Losartan Other (See Comments)   Hyperkalemia   Morphine And Related Shortness Of Breath   Rash, difficulty breathing, nausea.   Atenolol Other (See Comments)   Other reaction(s): Other (See Comments) Decreased heart rate Decreased heart rate    Codeine    Rash, difficulty breathing, nausea.   Nsaids Other (See Comments)   Other reaction(s): Unknown   Rofecoxib    Other reaction(s): Unknown   Sucralfate Other (See Comments)   Throat tightness   Sulfa Antibiotics    Other reaction(s): Unknown      Medication List       Accurate as of March 15, 2019  9:31 AM. If you have any questions, ask your nurse or doctor.        albuterol 108 (90 Base) MCG/ACT inhaler Commonly known as: VENTOLIN HFA Inhale 2 puffs into the lungs every 6 (six) hours as needed for wheezing or shortness of breath.   cloNIDine 0.2 mg/24hr patch Commonly known as: CATAPRES - Dosed in mg/24 hr Place 1 patch (0.2 mg total) onto the skin once a week.   clopidogrel 75 MG tablet Commonly known as: PLAVIX Take 1 tablet (75 mg total) by mouth daily.   docusate sodium 100 MG capsule Commonly known as: COLACE Take 100 mg by mouth daily as needed for mild constipation. Takes daily   doxazosin 8 MG tablet Commonly known as: CARDURA   erythromycin ophthalmic ointment   furosemide 20 MG tablet Commonly known as: LASIX Take 1 tablet (20 mg) by mouth once daily as needed for weight gain/ shortness of breath/ swelling   hydrALAZINE 25 MG tablet Commonly known as: APRESOLINE Take 1 tablet (25 mg) by mouth two times a day.   ipratropium-albuterol 0.5-2.5 (3) MG/3ML Soln Commonly known as: DUONEB Take 3 mLs by nebulization every 4 (four) hours as needed (shortness of breath/wheezing.).   levothyroxine 125 MCG tablet Commonly known as: SYNTHROID Take 125 mcg by mouth daily.   lisinopril 10 MG tablet Commonly known as: ZESTRIL TAKE 1 TABLET DAILY   metFORMIN 1000 MG tablet Commonly known as: GLUCOPHAGE Take 1,000 mg by mouth 2 (two) times daily with a meal.   oxybutynin 5 MG 24 hr tablet Commonly known as: DITROPAN-XL Take 5 mg by mouth daily.   pantoprazole 20 MG tablet Commonly known as: PROTONIX Take 20 mg by mouth daily.   simvastatin  40 MG tablet Commonly known as: ZOCOR Take 1 tablet (40 mg total) by mouth at bedtime.   Symbicort 160-4.5 MCG/ACT inhaler Generic drug: budesonide-formoterol Inhale 2 puffs into the lungs 2 (two) times daily.       Allergies:  Allergies  Allergen Reactions  . Losartan Other (See Comments)    Hyperkalemia  . Morphine And Related Shortness Of Breath    Rash, difficulty breathing, nausea.   . Atenolol Other (See Comments)    Other reaction(s): Other (See Comments) Decreased heart rate Decreased heart rate  . Codeine     Rash, difficulty breathing, nausea.  . Nsaids Other (See Comments)    Other reaction(s): Unknown  . Rofecoxib     Other reaction(s): Unknown  . Sucralfate Other (See Comments)    Throat tightness  . Sulfa  Antibiotics     Other reaction(s): Unknown    Family History: Family History  Problem Relation Age of Onset  . Breast cancer Mother 74    Social History:  reports that she has never smoked. She has never used smokeless tobacco. She reports that she does not drink alcohol or use drugs.  ROS: UROLOGY Frequent Urination?: Yes Hard to postpone urination?: No Burning/pain with urination?: No Get up at night to urinate?: No Leakage of urine?: No Urine stream starts and stops?: No Trouble starting stream?: No Do you have to strain to urinate?: No Blood in urine?: No Urinary tract infection?: No Sexually transmitted disease?: No Injury to kidneys or bladder?: No Painful intercourse?: No Weak stream?: No Currently pregnant?: No Vaginal bleeding?: No Last menstrual period?: N  Gastrointestinal Nausea?: No Vomiting?: No Indigestion/heartburn?: No Diarrhea?: No Constipation?: No  Constitutional Fever: No Night sweats?: No Weight loss?: No Fatigue?: No  Skin Skin rash/lesions?: No Itching?: No  Eyes Blurred vision?: No Double vision?: No  Ears/Nose/Throat Sore throat?: No Sinus problems?: No  Hematologic/Lymphatic Swollen  glands?: No Easy bruising?: No  Cardiovascular Leg swelling?: No Chest pain?: No  Respiratory Cough?: No Shortness of breath?: No  Endocrine Excessive thirst?: No  Musculoskeletal Back pain?: No Joint pain?: No  Neurological Headaches?: No Dizziness?: No  Psychologic Depression?: No Anxiety?: No  Physical Exam: BP (!) 142/88   Pulse 75   Ht 5\' 2"  (1.575 m)   Wt 170 lb (77.1 kg)   BMI 31.09 kg/m   Constitutional:  Alert and oriented, No acute distress.  In wheelchair, pleasant, appears slightly younger than stated age. HEENT: [place] AT, moist mucus membranes.  Trachea midline, no masses. Cardiovascular: No clubbing, cyanosis, or edema. Respiratory: Normal respiratory effort, no increased work of breathing. GI: Abdomen is soft, nontender, nondistended, obese Skin: No rashes, bruises or suspicious lesions. Neurologic: Grossly intact, no focal deficits, moving all 4 extremities. Psychiatric: Normal mood and affect.  Laboratory Data: Lab Results  Component Value Date   WBC 4.9 12/13/2018   HGB 9.4 (L) 12/13/2018   HCT 28.8 (L) 12/13/2018   MCV 97.6 12/13/2018   PLT 165 12/13/2018    Lab Results  Component Value Date   CREATININE 2.01 (H) 02/07/2019    Lab Results  Component Value Date   HGBA1C 5.6 10/01/2015    Pertinent Imaging: Results for orders placed during the hospital encounter of 12/10/18  US RENAL   Narrative CLINICAL DATA:  Chronic renal disease  EXAM: RENAL / URINARY TRACT ULTRASOUND COMPLETE  COMPARISON:  None.  FINDINGS: Right Kidney:  Renal measurements: 10.4 x 5.0 x 5.0 cm = volume: 137.5 mL. There is a 2.7 cm cyst in the right kidney with a thin septation and no solid components. This is most consistent with a Bosniak 2 cyst. There is cortical thinning with the cortex measuring less than a cm.  Left Kidney:  Renal measurements: 10.2 x 4.7 x 4.7 cm = volume: 116.2 mL. There is an 11 mm stone in the upper pole of the left kidney.  2 cysts are identified with the largest measuring 3.4 cm.  Bladder:  The bladder is decompressed and not visualized.  Other:  None  IMPRESSION: 1. Bilateral renal cysts. 2. 11 mm nonobstructive stone in the left kidney 3. Mild cortical thinning on the right measuring 9.4 mm. Cortical thickness is borderline on the left measuring 9 mm in the upper pole and 13 mm in the lower pole.   Electronically Signed  By: Dorise Bullion III M.D   On: 12/10/2018 14:28    Renal ultrasound personally reviewed.  This is also compared to renal ultrasound in 2018.  Stones in the left upper pole appear to be a cluster of smaller stones were than one large stone.  Would not appreciate on previous ultrasound.  Agree with above radiologic interpretation.  Assessment & Plan:     1. Nephrolithiasis Asymptomatic incidental left upper pole stones  If she has no symptoms, would recommend observation.  We discussed the date that ultrasound can often EVAR overestimate or underestimate the size of stones based on the shadow that they create.  As such, would like her to get a KUB today for baseline..  We can use [place] to compare for interval growth in the future.  She is agreeable this plan.  In the interim, we did discuss stone diet recommendations.  Encouraged adequate hydration, avoidance of high salt foods, high oxalate containing foods, and and introduction of citrus a stone preventative.  Warning symptoms and signs symptoms of stone episode were discussed.  She can follow-up sooner as needed if she has any concern for stones. - Urinalysis, Complete - Abdomen 1 view (KUB); Future   Return in about 1 year (around 03/14/2020) for KUB with PA.  Hollice Espy, MD  [facility] [address], [place], [place] [address] [phone]

## 2019-03-15 NOTE — Patient Instructions (Signed)
Dietary Guidelines to Help Prevent Kidney Stones Kidney stones are deposits of minerals and salts that form inside your kidneys. Your risk of developing kidney stones may be greater depending on your diet, your lifestyle, the medicines you take, and whether you have certain medical conditions. Most people can reduce their chances of developing kidney stones by following the instructions below. Depending on your overall health and the type of kidney stones you tend to develop, your dietitian may give you more specific instructions. What are tips for following this plan? Reading food labels  Choose foods with "no salt added" or "low-salt" labels. Limit your sodium intake to less than 1500 mg per day.  Choose foods with calcium for each meal and snack. Try to eat about 300 mg of calcium at each meal. Foods that contain 200-500 mg of calcium per serving include: ? 8 oz (237 ml) of milk, fortified nondairy milk, and fortified fruit juice. ? 8 oz (237 ml) of kefir, yogurt, and soy yogurt. ? 4 oz (118 ml) of tofu. ? 1 oz of cheese. ? 1 cup (300 g) of dried figs. ? 1 cup (91 g) of cooked broccoli. ? 1-3 oz can of sardines or mackerel.  Most people need 1000 to 1500 mg of calcium each day. Talk to your dietitian about how much calcium is recommended for you. Shopping  Buy plenty of fresh fruits and vegetables. Most people do not need to avoid fruits and vegetables, even if they contain nutrients that may contribute to kidney stones.  When shopping for convenience foods, choose: ? Whole pieces of fruit. ? Premade salads with dressing on the side. ? Low-fat fruit and yogurt smoothies.  Avoid buying frozen meals or prepared deli foods.  Look for foods with live cultures, such as yogurt and kefir. Cooking  Do not add salt to food when cooking. Place a salt shaker on the table and allow each person to add his or her own salt to taste.  Use vegetable protein, such as beans, textured vegetable  protein (TVP), or tofu instead of meat in pasta, casseroles, and soups. Meal planning   Eat less salt, if told by your dietitian. To do this: ? Avoid eating processed or premade food. ? Avoid eating fast food.  Eat less animal protein, including cheese, meat, poultry, or fish, if told by your dietitian. To do this: ? Limit the number of times you have meat, poultry, fish, or cheese each week. Eat a diet free of meat at least 2 days a week. ? Eat only one serving each day of meat, poultry, fish, or seafood. ? When you prepare animal protein, cut pieces into small portion sizes. For most meat and fish, one serving is about the size of one deck of cards.  Eat at least 5 servings of fresh fruits and vegetables each day. To do this: ? Keep fruits and vegetables on hand for snacks. ? Eat 1 piece of fruit or a handful of berries with breakfast. ? Have a salad and fruit at lunch. ? Have two kinds of vegetables at dinner.  Limit foods that are high in a substance called oxalate. These include: ? Spinach. ? Rhubarb. ? Beets. ? Potato chips and french fries. ? Nuts.  If you regularly take a diuretic medicine, make sure to eat at least 1-2 fruits or vegetables high in potassium each day. These include: ? Avocado. ? Banana. ? Orange, prune, carrot, or tomato juice. ? Baked potato. ? Cabbage. ? Beans and split   peas. General instructions   Drink enough fluid to keep your urine clear or pale yellow. This is the most important thing you can do.  Talk to your health care provider and dietitian about taking daily supplements. Depending on your health and the cause of your kidney stones, you may be advised: ? Not to take supplements with vitamin C. ? To take a calcium supplement. ? To take a daily probiotic supplement. ? To take other supplements such as magnesium, fish oil, or vitamin B6.  Take all medicines and supplements as told by your health care provider.  Limit alcohol intake to no  more than 1 drink a day for nonpregnant women and 2 drinks a day for men. One drink equals 12 oz of beer, 5 oz of wine, or 1 oz of hard liquor.  Lose weight if told by your health care provider. Work with your dietitian to find strategies and an eating plan that works best for you. What foods are not recommended? Limit your intake of the following foods, or as told by your dietitian. Talk to your dietitian about specific foods you should avoid based on the type of kidney stones and your overall health. Grains Breads. Bagels. Rolls. Baked goods. Salted crackers. Cereal. Pasta. Vegetables Spinach. Rhubarb. Beets. Canned vegetables. Pickles. Olives. Meats and other protein foods Nuts. Nut butters. Large portions of meat, poultry, or fish. Salted or cured meats. Deli meats. Hot dogs. Sausages. Dairy Cheese. Beverages Regular soft drinks. Regular vegetable juice. Seasonings and other foods Seasoning blends with salt. Salad dressings. Canned soups. Soy sauce. Ketchup. Barbecue sauce. Canned pasta sauce. Casseroles. Pizza. Lasagna. Frozen meals. Potato chips. French fries. Summary  You can reduce your risk of kidney stones by making changes to your diet.  The most important thing you can do is drink enough fluid. You should drink enough fluid to keep your urine clear or pale yellow.  Ask your health care provider or dietitian how much protein from animal sources you should eat each day, and also how much salt and calcium you should have each day. This information is not intended to replace advice given to you by your health care provider. Make sure you discuss any questions you have with your health care provider. Document Revised: 06/02/2018 Document Reviewed: 01/22/2016 Elsevier Patient Education  2020 [employer]  

## 2019-03-16 LAB — BASIC METABOLIC PANEL
BUN/Creatinine Ratio: 24 (ref 12–28)
BUN: 41 mg/dL — ABNORMAL HIGH (ref 8–27)
CO2: 20 mmol/L (ref 20–29)
Calcium: 8.8 mg/dL (ref 8.7–10.3)
Chloride: 110 mmol/L — ABNORMAL HIGH (ref 96–106)
Creatinine, Ser: 1.7 mg/dL — ABNORMAL HIGH (ref 0.57–1.00)
GFR calc Af Amer: 30 mL/min/{1.73_m2} — ABNORMAL LOW (ref >59)
GFR calc non Af Amer: 26 mL/min/{1.73_m2} — ABNORMAL LOW (ref >59)
Glucose: 120 mg/dL — ABNORMAL HIGH (ref 65–99)
Potassium: 5.7 mmol/L — ABNORMAL HIGH (ref 3.5–5.2)
Sodium: 145 mmol/L — ABNORMAL HIGH (ref 134–144)

## 2019-03-17 ENCOUNTER — Telehealth: Payer: Self-pay | Admitting: *Deleted

## 2019-03-17 NOTE — Telephone Encounter (Addendum)
Patient informed, voiced understanding.   ----- Message from Hollice Espy, MD sent at 03/16/2019  9:45 AM EST ----- No stones visible on KUB.  Its possible that the "stones" on ultrasounds were just shadows and not even real stones.  I would recommend following up as needed.    No need to return next year unless you are having pain or bleeding.    Hollice Espy, MD

## 2019-03-18 ENCOUNTER — Ambulatory Visit: Payer: Medicare Other

## 2019-03-18 ENCOUNTER — Other Ambulatory Visit: Payer: Medicare Other

## 2019-03-18 ENCOUNTER — Ambulatory Visit: Payer: Medicare Other | Admitting: Oncology

## 2019-03-18 ENCOUNTER — Telehealth: Payer: Self-pay | Admitting: *Deleted

## 2019-03-18 DIAGNOSIS — I5032 Chronic diastolic (congestive) heart failure: Secondary | ICD-10-CM

## 2019-03-18 NOTE — Telephone Encounter (Signed)
-----   Message from Minna Merritts, MD sent at 03/17/2019  7:00 PM EST ----- labs Would not increase lasix, stay on QOD Potassium elevated, Would repeat in 7 days, possible hemolysis from blood draw

## 2019-03-18 NOTE — Telephone Encounter (Signed)
Reviewed lab results and recommendations by provider. Advised that he would like her to have repeat labs next week. She was agreeable with this and advised to go to [facility] next Wednesday to have those done. She verbalized understanding of our conversation, agreeable with plan, and had no further questions at this time.

## 2019-03-19 NOTE — Progress Notes (Signed)
[place]  [phone] [phone]  ID: Amy [name] OB: 18-Jan-1928  MR#: [id number]  LEX#:[id number]  Patient Care Team: [name], MD as PCP - General (Internal Medicine) Rockey Situ Kathlene November, MD as PCP - Cardiology (Cardiology) Minna Merritts, MD as Consulting Physician (Cardiology)  CHIEF COMPLAINT: Iron deficiency anemia secondary to chronic blood loss.  INTERVAL HISTORY: Patient returns to clinic today for repeat laboratory work, further evaluation, and continuation of IV iron.  She continues to have chronic weakness and fatigue, but otherwise feels well. She has not noted any further GI bleeds or changes in her bowel movements. She has no neurologic complaints. She denies any recent fevers or illnesses. She denies any chest pain, cough, or hemoptysis.  She reports continued chronic shortness of breath.  She denies any nausea, vomiting, constipation, or diarrhea.  She has no melena or hematochezia.  She has no urinary complaints.  Patient offers no further specific complaints today.  REVIEW OF SYSTEMS:   Review of Systems  Constitutional: Positive for malaise/fatigue. Negative for fever and weight loss.  HENT: Negative.  Negative for sore throat.   Respiratory: Positive for shortness of breath. Negative for cough, hemoptysis and stridor.   Cardiovascular: Negative.  Negative for chest pain and leg swelling.  Gastrointestinal: Negative.  Negative for abdominal pain, blood in stool and melena.  Genitourinary: Negative.  Negative for hematuria.  Musculoskeletal: Negative.  Negative for back pain.  Skin: Negative.  Negative for rash.  Neurological: Positive for weakness. Negative for sensory change, focal weakness and headaches.  Psychiatric/Behavioral: Negative.  The patient is not nervous/anxious.     As per HPI. Otherwise, a complete review of systems is negative.  PAST MEDICAL HISTORY: Past Medical History:  Diagnosis Date   . Bladder incontinence   . CAD (coronary artery disease)    a. 05/2009 Cath: LAD 90p (Xience 2.75 x 12 mm DES), 6m D1 40, LCx 40p/m, RCA 30/40/30p/m, RCA 30/25d;  b.  12/2011 Lexiscan MV: no ischemia, breast attenuation artifact, normal EF-->Low risk; c. 10/2016 MV: fixed apical defect, most likely apical thinning and attenuation, No ischemia, EF 60%.  . Cancer ([facility]    ovarian  . Carotid arterial disease w/ R Carotid Bruit (HCC)    a. 08/2016 Carotid U/S: 40-59% bilat ICA stenosis - f/u 1 yr.  . [facility]    a. Echo 09/2015: EF 55-60% w/ Grade 1 DD, sev Ca2+ MV annulus, mildly dil LA; b. 09/2016 Echo: EF 65-70%, Gr2 DD, mildly dil LA/RA, nl RV fxn.  . Chronic Dyspnea on exertion   . CKD (chronic kidney disease) stage 3, GFR 30-59 ml/min   . Degenerative arthritis of knee    bilateral knees  . Diabetes mellitus    Type II  . GIB (gastrointestinal bleeding)    a. 08/2016 Admission w/ presyncope/anemia/melena-->req Transfusion-->endo ok, colonoscopy w/ polyps but no source of bleeding (most likely diverticular).  . Hiatal hernia   . Hypertension   . Iron deficiency   . Menopausal symptoms   . Morbid obesity ([facility]   . Renal insufficiency   . Thyroid disease    hypothyroidism    PAST SURGICAL HISTORY: Past Surgical History:  Procedure Laterality Date  . COLONOSCOPY  2015  . COLONOSCOPY WITH PROPOFOL N/A 09/25/2016   Procedure: COLONOSCOPY WITH PROPOFOL;  Surgeon: AJonathon Bellows MD;  [facility];  Service: Gastroenterology;  Laterality: N/A;  . COLONOSCOPY WITH PROPOFOL N/A 09/26/2016   Procedure:  COLONOSCOPY WITH PROPOFOL;  Surgeon: [name], MD;  Location: [facility] ENDOSCOPY;  Service: Gastroenterology;  Laterality: N/A;  . ESOPHAGOGASTRODUODENOSCOPY (EGD) WITH PROPOFOL N/A 09/25/2016   Procedure: ESOPHAGOGASTRODUODENOSCOPY (EGD) WITH PROPOFOL;  Surgeon: [name], MD;  Location[facility]COPY;  Service: Gastroenterology;  Laterality:  N/A;  . RENAL ANGIOGRAPHY N/A 02/07/2019   Procedure: RENAL ANGIOGRAPHY;  Surgeon: [name], MD;  Location: [place] CV LAB;  Service: Cardiovascular;  Laterality: N/A;  . REPLACEMENT TOTAL KNEE BILATERAL    . TOTAL VAGINAL HYSTERECTOMY     ovarian mass, not cancerous  . UPPER GI ENDOSCOPY  2015    FAMILY HISTORY Family History  Problem Relation Age of Onset  . Breast cancer Mother 74       ADVANCED DIRECTIVES:    HEALTH MAINTENANCE: Social History   Tobacco Use  . Smoking status: Never Smoker  . Smokeless tobacco: Never Used  Substance Use Topics  . Alcohol use: No  . Drug use: No     Colonoscopy:  PAP:  Bone density:  Lipid panel:  Allergies  Allergen Reactions  . Losartan Other (See Comments)    Hyperkalemia  . Morphine And Related Shortness Of Breath    Rash, difficulty breathing, nausea.   . Atenolol Other (See Comments)    Other reaction(s): Other (See Comments) Decreased heart rate Decreased heart rate  . Codeine     Rash, difficulty breathing, nausea.  . Nsaids Other (See Comments)    Other reaction(s): Unknown  . Rofecoxib     Other reaction(s): Unknown  . Sucralfate Other (See Comments)    Throat tightness  . Sulfa Antibiotics     Other reaction(s): Unknown    Current Outpatient Medications  Medication Sig Dispense Refill  . albuterol (PROVENTIL HFA;VENTOLIN HFA) 108 (90 Base) MCG/ACT inhaler Inhale 2 puffs into the lungs every 6 (six) hours as needed for wheezing or shortness of breath.    . budesonide (PULMICORT) 0.5 MG/2ML nebulizer solution Inhale into the lungs.    . cloNIDine (CATAPRES - DOSED IN MG/24 HR) 0.2 mg/24hr patch Place 1 patch (0.2 mg total) onto the skin once a week. 4 patch 12  . clopidogrel (PLAVIX) 75 MG tablet Take 1 tablet (75 mg total) by mouth daily. 90 tablet 3  . docusate sodium (COLACE) 100 MG capsule Take 100 mg by mouth daily as needed for mild constipation. Takes daily    . doxazosin (CARDURA) 8 MG  tablet Take 1 tablet (8 mg total) by mouth daily. 90 tablet 3  . erythromycin ophthalmic ointment     . furosemide (LASIX) 20 MG tablet Take 1 tablet (20 mg) by mouth once daily as needed for weight gain/ shortness of breath/ swelling    . hydrALAZINE (APRESOLINE) 25 MG tablet Take 1 tablet (25 mg) by mouth two times a day. 180 tablet 2  . ipratropium-albuterol (DUONEB) 0.5-2.5 (3) MG/3ML SOLN Take 3 mLs by nebulization every 4 (four) hours as needed (shortness of breath/wheezing.). 360 mL   . levothyroxine (SYNTHROID, LEVOTHROID) 125 MCG tablet Take 125 mcg by mouth daily.    Marland Kitchen lisinopril (ZESTRIL) 10 MG tablet Take 1 tablet (10 mg total) by mouth daily. 90 tablet 3  . metFORMIN (GLUCOPHAGE) 1000 MG tablet Take 1,000 mg by mouth 2 (two) times daily with a meal.    . oxybutynin (DITROPAN-XL) 5 MG 24 hr tablet Take 5 mg by mouth daily.     . pantoprazole (PROTONIX) 20 MG tablet Take 20 mg by  mouth daily.    . simvastatin (ZOCOR) 40 MG tablet Take 1 tablet (40 mg total) by mouth at bedtime. 90 tablet 2  . SYMBICORT 160-4.5 MCG/ACT inhaler Inhale 2 puffs into the lungs 2 (two) times daily.      No current facility-administered medications for this visit.   Facility-Administered Medications Ordered in Other Visits  Medication Dose Route Frequency Provider Last Rate Last Admin  . 0.9 %  sodium chloride infusion   Intravenous Continuous Lloyd Huger, MD        OBJECTIVE: Vitals:   03/21/19 1053 03/21/19 1100  BP: (!) 149/35 (!) 160/37  Pulse: 74   Resp: 18   Temp: 98 F (36.7 C)      Body mass index is 32.37 kg/m.    ECOG FS:2 - Symptomatic, <50% confined to bed  General: Well-developed, well-nourished, no acute distress.  Sitting in a wheelchair. Eyes: Pink conjunctiva, anicteric sclera. HEENT: Normocephalic, moist mucous membranes. Lungs: No audible wheezing or coughing. Heart: Regular rate and rhythm. Abdomen: Soft, nontender, no obvious distention. Musculoskeletal: No edema,  cyanosis, or clubbing. Neuro: Alert, answering all questions appropriately. Cranial nerves grossly intact. Skin: No rashes or petechiae noted. Psych: Normal affect.  LAB RESULTS:  Lab Results  Component Value Date   NA 145 (H) 03/15/2019   K 5.7 (H) 03/15/2019   CL 110 (H) 03/15/2019   CO2 20 03/15/2019   GLUCOSE 120 (H) 03/15/2019   BUN 41 (H) 03/15/2019   CREATININE 1.70 (H) 03/15/2019   CALCIUM 8.8 03/15/2019   PROT 5.9 (L) 05/15/2017   ALBUMIN 2.9 (L) 05/15/2017   AST 26 05/15/2017   ALT 11 (L) 05/15/2017   ALKPHOS 70 05/15/2017   BILITOT 0.5 05/15/2017   GFRNONAA 26 (L) 03/15/2019   GFRAA 30 (L) 03/15/2019    Lab Results  Component Value Date   WBC 6.2 03/21/2019   NEUTROABS 4.6 03/21/2019   HGB 8.7 (L) 03/21/2019   HCT 28.3 (L) 03/21/2019   MCV 104.8 (H) 03/21/2019   PLT 124 (L) 03/21/2019   Lab Results  Component Value Date   IRON 65 03/21/2019   TIBC 186 (L) 03/21/2019   IRONPCTSAT 35 (H) 03/21/2019    Lab Results  Component Value Date   FERRITIN 224 03/21/2019    STUDIES: Abdomen 1 view (KUB)  Result Date: 03/15/2019 CLINICAL DATA:  Left-sided abdominal pain.  Nephrolithiasis. EXAM: ABDOMEN - 1 VIEW COMPARISON:  October 14, 2013. FINDINGS: The bowel gas pattern is normal. No radio-opaque calculi or other significant radiographic abnormality are seen. IMPRESSION: Negative. Electronically Signed   By: Marijo Conception M.D.   On: 03/15/2019 13:10    ASSESSMENT:  Iron deficiency anemia secondary to chronic blood loss, shortness of breath.   PLAN:    1. Iron deficiency anemia secondary to chronic blood loss: Patient's hemoglobin has trended down slightly to 8.7.  Although her iron stores remain within normal limits, will proceed with additional IV Feraheme today.   Previously, patient was reported to be intolerant of oral iron supplementation.  Patient had colonoscopy and EGD in August 2018 which was unrevealing.  Given her mild renal insufficiency, she  also may benefit from Retacrit in the future.  Previously, we also discussed bone marrow biopsy, but patient has declined.  Return to clinic in 3 months with repeat laboratory work and further evaluation. 2. Pulmonary nodules: Repeat CT scan from January 13, 2017 revealed stable pulmonary nodules.  No further imaging is necessary.   3.  Renal insufficiency: Chronic and unchanged.  Consider Retacrit as above. 4.  Hypertension: Blood pressure is moderately elevated today.  Continue treatment and monitoring per primary care. 5.  History of GI bleed: Colonoscopy and EGD in August 2018.  Continue follow-up with GI as indicated.  Patient expressed understanding and was in agreement with this plan. She also understands that She can call clinic at any time with any questions, concerns, or complaints.    Lloyd Huger, MD   03/21/2019 2:43 PM

## 2019-03-21 ENCOUNTER — Inpatient Hospital Stay: Payer: Medicare Other

## 2019-03-21 ENCOUNTER — Other Ambulatory Visit: Payer: Self-pay

## 2019-03-21 ENCOUNTER — Encounter: Payer: Self-pay | Admitting: Oncology

## 2019-03-21 ENCOUNTER — Inpatient Hospital Stay: Payer: Medicare Other | Attending: Oncology | Admitting: Oncology

## 2019-03-21 ENCOUNTER — Ambulatory Visit: Payer: Medicare Other | Admitting: Podiatry

## 2019-03-21 VITALS — BP 148/62 | HR 61 | Resp 18

## 2019-03-21 VITALS — BP 160/37 | HR 74 | Temp 98.0°F | Resp 18 | Wt 177.0 lb

## 2019-03-21 DIAGNOSIS — Z9079 Acquired absence of other genital organ(s): Secondary | ICD-10-CM | POA: Insufficient documentation

## 2019-03-21 DIAGNOSIS — D500 Iron deficiency anemia secondary to blood loss (chronic): Secondary | ICD-10-CM | POA: Insufficient documentation

## 2019-03-21 DIAGNOSIS — Z7984 Long term (current) use of oral hypoglycemic drugs: Secondary | ICD-10-CM | POA: Diagnosis not present

## 2019-03-21 DIAGNOSIS — I701 Atherosclerosis of renal artery: Secondary | ICD-10-CM | POA: Diagnosis not present

## 2019-03-21 DIAGNOSIS — R0602 Shortness of breath: Secondary | ICD-10-CM | POA: Insufficient documentation

## 2019-03-21 DIAGNOSIS — Z90722 Acquired absence of ovaries, bilateral: Secondary | ICD-10-CM | POA: Insufficient documentation

## 2019-03-21 DIAGNOSIS — Z7951 Long term (current) use of inhaled steroids: Secondary | ICD-10-CM | POA: Insufficient documentation

## 2019-03-21 DIAGNOSIS — R918 Other nonspecific abnormal finding of lung field: Secondary | ICD-10-CM | POA: Insufficient documentation

## 2019-03-21 DIAGNOSIS — Z90710 Acquired absence of both cervix and uterus: Secondary | ICD-10-CM | POA: Insufficient documentation

## 2019-03-21 DIAGNOSIS — D509 Iron deficiency anemia, unspecified: Secondary | ICD-10-CM

## 2019-03-21 DIAGNOSIS — Z79899 Other long term (current) drug therapy: Secondary | ICD-10-CM | POA: Diagnosis not present

## 2019-03-21 DIAGNOSIS — Z803 Family history of malignant neoplasm of breast: Secondary | ICD-10-CM | POA: Diagnosis not present

## 2019-03-21 LAB — CBC WITH DIFFERENTIAL/PLATELET
Abs Immature Granulocytes: 0.06 10*3/uL (ref 0.00–0.07)
Basophils Absolute: 0 10*3/uL (ref 0.0–0.1)
Basophils Relative: 0 %
Eosinophils Absolute: 0 10*3/uL (ref 0.0–0.5)
Eosinophils Relative: 1 %
HCT: 28.3 % — ABNORMAL LOW (ref 36.0–46.0)
Hemoglobin: 8.7 g/dL — ABNORMAL LOW (ref 12.0–15.0)
Immature Granulocytes: 1 %
Lymphocytes Relative: 14 %
Lymphs Abs: 0.8 10*3/uL (ref 0.7–4.0)
MCH: 32.2 pg (ref 26.0–34.0)
MCHC: 30.7 g/dL (ref 30.0–36.0)
MCV: 104.8 fL — ABNORMAL HIGH (ref 80.0–100.0)
Monocytes Absolute: 0.6 10*3/uL (ref 0.1–1.0)
Monocytes Relative: 10 %
Neutro Abs: 4.6 10*3/uL (ref 1.7–7.7)
Neutrophils Relative %: 74 %
Platelets: 124 10*3/uL — ABNORMAL LOW (ref 150–400)
RBC: 2.7 MIL/uL — ABNORMAL LOW (ref 3.87–5.11)
RDW: 14.1 % (ref 11.5–15.5)
WBC: 6.2 10*3/uL (ref 4.0–10.5)
nRBC: 0 % (ref 0.0–0.2)

## 2019-03-21 LAB — IRON AND TIBC
Iron: 65 ug/dL (ref 28–170)
Saturation Ratios: 35 % — ABNORMAL HIGH (ref 10.4–31.8)
TIBC: 186 ug/dL — ABNORMAL LOW (ref 250–450)
UIBC: 121 ug/dL

## 2019-03-21 LAB — FERRITIN: Ferritin: 224 ng/mL (ref 11–307)

## 2019-03-21 MED ORDER — SODIUM CHLORIDE 0.9 % IV SOLN
510.0000 mg | Freq: Once | INTRAVENOUS | Status: AC
  Administered 2019-03-21: 510 mg via INTRAVENOUS
  Filled 2019-03-21: qty 17

## 2019-03-21 MED ORDER — SODIUM CHLORIDE 0.9 % IV SOLN
Freq: Once | INTRAVENOUS | Status: AC
  Filled 2019-03-21: qty 250

## 2019-03-21 NOTE — Progress Notes (Signed)
stent in kidneys to help lower blood pressure, trouble sleeping, SOB. Wants to get COVID vaccine

## 2019-03-22 LAB — ERYTHROPOIETIN: Erythropoietin: 12.8 m[IU]/mL (ref 2.6–18.5)

## 2019-03-23 ENCOUNTER — Other Ambulatory Visit
Admission: RE | Admit: 2019-03-23 | Discharge: 2019-03-23 | Disposition: A | Discharge status: Home / Self Care | Payer: Medicare Other | Source: Ambulatory Visit | Attending: Cardiovascular Disease | Admitting: Cardiovascular Disease

## 2019-03-23 DIAGNOSIS — I5032 Chronic diastolic (congestive) heart failure: Secondary | ICD-10-CM | POA: Diagnosis present

## 2019-03-23 LAB — BASIC METABOLIC PANEL
Anion gap: 10 (ref 5–15)
BUN: 62 mg/dL — ABNORMAL HIGH (ref 8–23)
CO2: 22 mmol/L (ref 22–32)
Calcium: 8.7 mg/dL — ABNORMAL LOW (ref 8.9–10.3)
Chloride: 109 mmol/L (ref 98–111)
Creatinine, Ser: 1.96 mg/dL — ABNORMAL HIGH (ref 0.44–1.00)
GFR calc Af Amer: 25 mL/min — ABNORMAL LOW (ref >60)
GFR calc non Af Amer: 22 mL/min — ABNORMAL LOW (ref >60)
Glucose, Bld: 128 mg/dL — ABNORMAL HIGH (ref 70–99)
Potassium: 4.8 mmol/L (ref 3.5–5.1)
Sodium: 141 mmol/L (ref 135–145)

## 2019-03-24 ENCOUNTER — Encounter: Payer: Self-pay | Admitting: Podiatry

## 2019-03-24 ENCOUNTER — Ambulatory Visit (INDEPENDENT_AMBULATORY_CARE_PROVIDER_SITE_OTHER): Payer: Medicare Other | Admitting: Podiatry

## 2019-03-24 ENCOUNTER — Other Ambulatory Visit: Payer: Self-pay

## 2019-03-24 DIAGNOSIS — M79676 Pain in unspecified toe(s): Secondary | ICD-10-CM

## 2019-03-24 DIAGNOSIS — B351 Tinea unguium: Secondary | ICD-10-CM

## 2019-03-24 DIAGNOSIS — D689 Coagulation defect, unspecified: Secondary | ICD-10-CM

## 2019-03-24 DIAGNOSIS — E119 Type 2 diabetes mellitus without complications: Secondary | ICD-10-CM

## 2019-03-24 NOTE — Progress Notes (Signed)
Complaint:  Visit Type: Patient returns to my office for continued preventative foot care services. Complaint: Patient states" my nails have grown long and thick and become painful to walk and wear shoes" Patient has been diagnosed with DM with no foot complications. The patient presents for preventative foot care services. Patient presents in a wheelchair. No changes to ROS.  Patient is taking plavix.  Podiatric Exam: Vascular: dorsalis pedis and posterior tibial pulses are palpable bilateral. Capillary return is immediate. Temperature gradient is WNL. Skin turgor WNL  Sensorium: Normal Semmes Weinstein monofilament test. Normal tactile sensation bilaterally. Nail Exam: Pt has thick disfigured discolored nails with subungual debris noted bilateral entire nail hallux through fifth toenails Ulcer Exam: There is no evidence of ulcer or pre-ulcerative changes or infection. Orthopedic Exam: Muscle tone and strength are WNL. No limitations in general ROM. No crepitus or effusions noted. Foot type and digits show no abnormalities. Bony prominences are unremarkable. Skin: No Porokeratosis. No infection or ulcers  Diagnosis:  Onychomycosis, , Pain in right toe, pain in left toes  Treatment & Plan Procedures and Treatment: Consent by patient was obtained for treatment procedures. The patient understood the discussion of treatment and procedures well. All questions were answered thoroughly reviewed. Debridement of mycotic and hypertrophic toenails, 1 through 5 bilateral and clearing of subungual debris. No ulceration, no infection noted.  Return Visit-Office Procedure: Patient instructed to return to the office for a follow up visit 12 weeks  for continued evaluation and treatment.    Gardiner Barefoot DPM

## 2019-03-25 ENCOUNTER — Telehealth: Payer: Self-pay

## 2019-03-25 NOTE — Telephone Encounter (Signed)
Telephone call to patient to schedule palliative care visit.  Patient requests telephonic vist on Monday 03/28/19 at 11:00 AM.

## 2019-03-28 ENCOUNTER — Other Ambulatory Visit: Payer: Medicare Other

## 2019-03-28 ENCOUNTER — Other Ambulatory Visit: Payer: Self-pay

## 2019-03-28 DIAGNOSIS — Z515 Encounter for palliative care: Secondary | ICD-10-CM

## 2019-03-28 NOTE — Progress Notes (Signed)
PATIENT NAME: Amy [name] DOB: 09-03-1927 MRN: [id number]  PRIMARY CARE PROVIDER: [name], MD  RESPONSIBLE PARTY:  Acct ID - Guarantor Home Phone Work Phone Relationship Acct Type  [id number] Shabria, Egley [phone]  Self P/F     [address], [place], [place] [address]    PLAN OF CARE and INTERVENTIONS:               1.  GOALS OF CARE/ ADVANCE CARE PLANNING:  Patient wants to remain in he home with family checking on her.                 2.  PATIENT/CAREGIVER EDUCATION:  Education on fall precautions, education on renal failure and dialysis, reviewed meds, support               3.  DISEASE STATUS:Due to the COVID-19 crisis, this visit was done via telephonic from my office and it was initiated and consent by this patient and or family. RN completed telephonic visit per patients request. Patient reports she is doing fair. Patient had stent placed in left renal artery in December. Patient reports MD informed care if she did not have stent placed she would require dialysis. Patient has severe stage 4 renal failure. Education to patient on what dialysis requires and likely she would receive dialysis 3 days per week. Patient isn't sure if she would want to have dialysis. Patient reports she also was treated for bronchitis. Patient reports she is feeling better and denies pain at the present time. Patient reports after stent was placed she suffered with a pinched nerve in her left shoulder for 3 weeks. Patients creatinine 1.70 when last drawn. Patient reports MD also informed her she has asthma. Patient continues to feel breathless with minimal exertion and with talking. Patient states she has had multiple MD appointments but states she has no appointments this week. Patient reports she is continuing to have a fair appetite and prefers to eat sweet and fruits. Patient states she has been sleeping about the same and continues to wake up every couple of hours. Patient denies suffering any  recent falls. Patients daughter and granddaughter remains involved in patients care and check on patient daily. Patient denies having any changes and medications other than being on antibiotic and using inhalers when she was suffering with bronchitis. Patient reports she continues to use Lasix 20 mg and hass been taking every other day.  Nurse reviewed patient's medications with patient. Patient  encouraged to contact palliative care with questions or concerns. Nurse feels like patient is declining and will need hospice soon.  Team will plan on discussing benefits of hospice at next visit if patient does not require dialysis.      HISTORY OF PRESENT ILLNESS: Patient is a 86 year old female who resides in her own home next door to her granddaughter.  Patient is followed by palliative care team and is seen monthly and PRN   CODE STATUS: DNR  ADVANCED DIRECTIVES: Y MOST FORM: Yes PPS: 40%   PHYSICAL EXAM:   VITALS: Today's Vitals   03/28/19 1124  Weight: 178 lb 14.4 oz (81.1 kg)  Height: 5\' 2"  (1.575 m)  PainSc: 0-No pain    LUNGS: shortness of breath with minimal exertion/ talking, bot using O2 at the present time CARDIAC: Patient has CHF EXTREMITIES: Patient reports having edema in LE's left greater than the right SKIN: Patient denies having any open areas of skin breakdown   NEURO: positive for  memory problems and weakness       [name], RN

## 2019-04-25 ENCOUNTER — Other Ambulatory Visit: Payer: Medicare Other

## 2019-04-25 ENCOUNTER — Telehealth: Payer: Self-pay

## 2019-04-25 ENCOUNTER — Other Ambulatory Visit: Payer: Self-pay

## 2019-04-25 DIAGNOSIS — Z515 Encounter for palliative care: Secondary | ICD-10-CM

## 2019-04-25 NOTE — Progress Notes (Signed)
PATIENT NAME: Amy [name] DOB: 1927/04/05 MRN: [id number]  PRIMARY CARE PROVIDER: [name], MD  RESPONSIBLE PARTY:  Acct ID - Guarantor Home Phone Work Phone Relationship Acct Type  [id number] Amy, [name] [phone]  Self P/F     [address], [place], [place] [address]    PLAN OF CARE and INTERVENTIONS:               1.  GOALS OF CARE/ ADVANCE CARE PLANNING:  Patient wants to remain at home and remain independent for as long as possible.                2.  PATIENT/CAREGIVER EDUCATION:  Education on fall precautions, education on s/s of infection, reviewed meds, support                3.  DISEASE STATUS: SN made scheduled palliative care home visit. Patient setting on sofa. Patient denies having any pain at the present time. Patient continues to feel weak and is breathless with talking. Patient states MD feels like her breathlessness is related to her cardiac, kidney and her being anemic. Patient reports she has received both of her covid-19 vaccines. Patients metformin was stopped due to GFR being 25. Patient also had a stent placed in her kidney in December.  Patient continues to take Lasix every other day for edema in her lower extremities. Patient has 1 + edema in right lower extremity and 2 + edema in left lower extremity. Patient scheduled to receive injections in her eyes tomorrow which patient does frequently. Patient informs nurse she can't walk and very far and breathe. Patient continues to do neb treatments twice a day. Patient denies suffering any recent falls. Patients breath sounds are clear and patient reports she is no longer coughing up phlegm. Patient reports she feels she sleeps enough but typically wakes several times during the night. Patient reports she is typically awake from 2 to 4 a.m. and then falls back to sleep. MD has also made recommendation for patient to stop her lisinopril Protonix and vitamins (pulmonary MD). Patient reports her daughter comes by  every day to check on her. Granddaughter resides in home in front of patient. Nurse reviewed patient's medication with patient. Patient blood pressure continues to be elevated and will almost bottom out on diastolic pressure.  Patient remains in agreement with palliative care services. Patient encouraged to contact palliative care with questions or concerns. Visit scheduled for 05/26/19 at 10:00 however patient wants palliative care team to call to confirm time.        HISTORY OF PRESENT ILLNESS: Patient is a 86 year old patient who resides at home.  Patient is seen monthly and PRN.    CODE STATUS: DNR  ADVANCED DIRECTIVES: Y MOST FORM: Yes PPS: 40%   PHYSICAL EXAM:   VITALS: Today's Vitals   04/25/19 1203  BP: (!) 180/40  Pulse: (!) 55  Resp: 18  Temp: 98.2 F (36.8 C)  TempSrc: Temporal  SpO2: 98%  Weight: 180 lb (81.6 kg)  PainSc: 0-No pain    LUNGS: clear to auscultation  CARDIAC: Cor Brady  EXTREMITIES: 1+ edema to RRE, 2+ edema to LLE SKIN: no visible areas of open skin breakdown  NEURO: Alert and oriented x 3, forgetful, weakness, fall risk       Amy Simmer, RN

## 2019-04-25 NOTE — Telephone Encounter (Signed)
Telephone call to patient to schedule palliative care visit.  Patient in agreement with RN making home visit today at 12:00 PM.

## 2019-05-18 ENCOUNTER — Telehealth: Payer: Self-pay | Admitting: Cardiovascular Disease

## 2019-05-18 NOTE — Telephone Encounter (Signed)
Spoke with patient and she states that she is feeling much better now. Inquired about her pain and she states it felt like her heart but as she rubbed on it then it got better. Reviewed that may be the muscles on the outside and not the heart, especially if she rubs it and it improves. Then we talked about her shortness of breath. She reports this is persistent and normal for her. Inquired if this was worse than her normal baseline and she denied it being worse. She is now resting and feels much better. Advised that if her symptoms should return or worsen then to give [place] a call back. She verbalized understanding with no further questions at this time.

## 2019-05-18 NOTE — Telephone Encounter (Signed)
Pt c/o of Chest Pain: STAT if CP now or developed within 24 hours  1. Are you having CP right now? States it is not really a pain but its more of a dull ache  2. Are you experiencing any other symptoms (ex. SOB, nausea, vomiting, sweating)? Ongoing sob  3. How long have you been experiencing CP? Few days  4. Is your CP continuous or coming and going? On and off   5. Have you taken Nitroglycerin? no ?

## 2019-05-24 ENCOUNTER — Telehealth: Payer: Self-pay

## 2019-05-24 NOTE — Telephone Encounter (Signed)
Telephone call to patient to schedule palliative care visit.  Patient in agreement with telephonic visit 05/26/19 at 10:00 AM.

## 2019-05-26 ENCOUNTER — Other Ambulatory Visit: Payer: Medicare Other

## 2019-05-26 DIAGNOSIS — Z515 Encounter for palliative care: Secondary | ICD-10-CM

## 2019-05-26 NOTE — Progress Notes (Signed)
PATIENT NAME: Amy [name] DOB: 1927/05/04 MRN: [id number]  PRIMARY CARE PROVIDER: [name], MD  RESPONSIBLE PARTY:  Acct ID - Guarantor Home Phone Work Phone Relationship Acct Type  [id number] Amy [name],* [phone]  Self P/F     [address], [place], [place] [address]    PLAN OF CARE and INTERVENTIONS:               1.  GOALS OF CARE/ ADVANCE CARE PLANNING:  Remain in her home with family checking on patient frequently.                2.  PATIENT/CAREGIVER EDUCATION:  Education on fall precautions, education on s/s of infection, reviewed meds, support                3.  DISEASE STATUS:Due to the COVID-19 crisis, this visit was done via telephonic from my office and it was initiated and consent by this patient and or family. RN completed telephonic visit per patients request. Patient answered phone and report she feels she is doing better. Patient reported she had been having some aching around her heart. Patient states she informed MD's office when she went in for her B12 injection. Patient states MD felt like pain was muscle-related and made no changes in patients current medications. Patient reports she continues to have shortness of breath with walking from one room to the other. Education to patient this was likely due to her heart being weak and patient states MD has not really given her an answer on why she feels weak. Patient continues to receive injections in her eyes every 7 weeks. Patients scheduled to see doctor Dew this month to follow up with stent that was placed in her kidney several months ago. Patient denies having any pain at the present time. Patient reports her appetite is good and she continues to enjoy eating sweets. Patient has received both of her CoVid vaccines  and reports she received her second shingle vaccine this week. Patient reports she did well with receiving vaccines and had no side effects.  Patient denies suffering any recent falls. Patient  denies having any cough and continues to do inhalers and nebulizer treatment as directed. Patient reports she is having more difficulty standing as she feels she is more drawn over. Patient states they have informed her she has arthritis in her back but does not take anything for arthritis. Nurse provided education that Tylenol arthritis is sometimes taken for arthritic pain and as people age most people do develop arthritis. Nurse provided education to patient on fall precautions.  Patient blood pressure continues to bottom out  Diastolic. Patient states she does feel dizzy with going from sitting to standing and rests before beginning to ambulate.  Nurse reviewed patient's medications with patient. Patient remains in agreement with palliative care services. Patient encouraged to contact palliative care with questions or concerns.   HISTORY OF PRESENT ILLNESS:  Patient is a 86 year old female who resides in her own home.  Patients family checks on patient frequently.  Patient is followed by palliative care and is seen monthly and PRN.    CODE STATUS: DNR  ADVANCED DIRECTIVES: Y MOST FORM: Yes PPS: 40%   PHYSICAL EXAM:   VITALS: Today's Vitals   05/26/19 1010  Weight: 180 lb (81.6 kg)  PainSc: 0-No pain    LUNGS: patient admits to having shortness of breath with minimal exertion, denies cough  CARDIAC: patient has history of heart disease  EXTREMITIES: patient continues to have edema in LE's left greater than the right SKIN: patient denies having any open areas of skin breakdown  NEURO: Alert and oriented, mild confusion, fall risk       [name], RN

## 2019-06-07 ENCOUNTER — Encounter (INDEPENDENT_AMBULATORY_CARE_PROVIDER_SITE_OTHER): Payer: Self-pay | Admitting: Nurse Practitioner

## 2019-06-07 ENCOUNTER — Ambulatory Visit (INDEPENDENT_AMBULATORY_CARE_PROVIDER_SITE_OTHER): Payer: Medicare Other

## 2019-06-07 ENCOUNTER — Ambulatory Visit (INDEPENDENT_AMBULATORY_CARE_PROVIDER_SITE_OTHER): Payer: Medicare Other | Admitting: Vascular Surgery

## 2019-06-07 ENCOUNTER — Encounter (INDEPENDENT_AMBULATORY_CARE_PROVIDER_SITE_OTHER): Payer: Medicare Other

## 2019-06-07 ENCOUNTER — Ambulatory Visit (INDEPENDENT_AMBULATORY_CARE_PROVIDER_SITE_OTHER): Payer: Medicare Other | Admitting: Nurse Practitioner

## 2019-06-07 ENCOUNTER — Other Ambulatory Visit: Payer: Self-pay

## 2019-06-07 VITALS — BP 191/53 | HR 60 | Resp 16

## 2019-06-07 DIAGNOSIS — I150 Renovascular hypertension: Secondary | ICD-10-CM

## 2019-06-07 DIAGNOSIS — E782 Mixed hyperlipidemia: Secondary | ICD-10-CM

## 2019-06-07 DIAGNOSIS — I701 Atherosclerosis of renal artery: Secondary | ICD-10-CM

## 2019-06-07 NOTE — Progress Notes (Signed)
Subjective:    Patient ID: Amy [name], female    DOB: 1927-02-28, 86 y.o.   MRN: [id number] Chief Complaint  Patient presents with  . Follow-up    ultrasound follow up    The patient returns to the office for followup and review of the noninvasive studies regarding renal vascular hypertension and renal artery stenosis. There have been no interval changes in the patient's blood pressure control.  However it is greatly elevated today due to the fact that the patient was unable to take her medication prior to her noninvasive studies today.  She denies any major changes in is medications.  The patient denies headache or flushing.  No flank or unusual back pain.    There have been no significant changes to the patient's overall health care.  No interval shortening of the patient's walking distance or new symptoms consistent with claudication.  The patient denies the  development of rest pain symptoms. No new ulcers or wounds have occurred since the last visit.  The patient denies amaurosis fugax or recent TIA symptoms. There are no recent neurological changes noted. The patient denies history of DVT, PE or superficial thrombophlebitis. The patient denies recent episodes of angina or shortness of breath.   Duplex ultrasound of the renal arteries reveal normal size kidneys bilaterally.  There is a 1 to 59% stenosis of the left renal artery and no stenosis present in the right.  The patient also has bilateral renal cyst cyst seen on ultrasound, these are known cyst.  The velocities have not had any change as compared to the previous exam on 01/11/2019   Review of Systems  Respiratory: Positive for shortness of breath (With exertion has been ongoing for over a year).   Neurological: Positive for weakness.  All other systems reviewed and are negative.      Objective:   Physical Exam Cardiovascular:     Rate and Rhythm: Normal rate and regular rhythm.     Pulses: Normal pulses.    Pulmonary:     Effort: Pulmonary effort is normal.     Breath sounds: Normal breath sounds.  Abdominal:     General: Abdomen is flat.  Skin:    General: Skin is warm.  Neurological:     Mental Status: She is alert.     Motor: Weakness present.     Gait: Gait abnormal.  Psychiatric:        Mood and Affect: Mood normal.        Behavior: Behavior normal.        Thought Content: Thought content normal.        Judgment: Judgment normal.     BP (!) 191/53 (BP Location: Right Arm)   Pulse 60   Resp 16   Past Medical History:  Diagnosis Date  . Bladder incontinence   . CAD (coronary artery disease)    a. 05/2009 Cath: LAD 90p (Xience 2.75 x 12 mm DES), 39m, D1 40, LCx 40p/m, RCA 30/40/30p/m, RCA 30/25d;  b.  12/2011 Lexiscan MV: no ischemia, breast attenuation artifact, normal EF-->Low risk; c. 10/2016 MV: fixed apical defect, most likely apical thinning and attenuation, No ischemia, EF 60%.  . Cancer ([facility])    ovarian  . Carotid arterial disease w/ R Carotid Bruit (HCC)    a. 08/2016 Carotid U/S: 40-59% bilat ICA stenosis - f/u 1 yr.  . Chronic diastolic (congestive) heart failure ([facility])    a. Echo 09/2015: EF 55-60% w/ Grade 1 DD, sev  Ca2+ MV annulus, mildly dil LA; b. 09/2016 Echo: EF 65-70%, Gr2 DD, mildly dil LA/RA, nl RV fxn.  . Chronic Dyspnea on exertion   . CKD (chronic kidney disease) stage 3, GFR 30-59 ml/min   . Degenerative arthritis of knee    bilateral knees  . Diabetes mellitus    Type II  . GIB (gastrointestinal bleeding)    a. 08/2016 Admission w/ presyncope/anemia/melena-->req Transfusion-->endo ok, colonoscopy w/ polyps but no source of bleeding (most likely diverticular).  . Hiatal hernia   . Hypertension   . Iron deficiency   . Menopausal symptoms   . Morbid obesity ([facility])   . Renal insufficiency   . Thyroid disease    hypothyroidism    Social History   Socioeconomic History  . Marital status: Widowed    Spouse name: Not on file  . Number of children:  Not on file  . Years of education: Not on file  . Highest education level: Not on file  Occupational History  . Not on file  Tobacco Use  . Smoking status: Never Smoker  . Smokeless tobacco: Never Used  Substance and Sexual Activity  . Alcohol use: No  . Drug use: No  . Sexual activity: Not on file  Other Topics Concern  . Not on file  Social History Narrative  . Not on file   Social Determinants of Health   Financial Resource Strain:   . Difficulty of Paying Living Expenses:   Food Insecurity:   . Worried About Charity fundraiser in the Last Year:   . Arboriculturist in the Last Year:   Transportation Needs:   . Film/video editor (Medical):   Marland Kitchen Lack of Transportation (Non-Medical):   Physical Activity:   . Days of Exercise per Week:   . Minutes of Exercise per Session:   Stress:   . Feeling of Stress :   Social Connections:   . Frequency of Communication with Friends and Family:   . Frequency of Social Gatherings with Friends and Family:   . Attends Religious Services:   . Active Member of Clubs or Organizations:   . Attends Archivist Meetings:   Marland Kitchen Marital Status:   Intimate Partner Violence:   . Fear of Current or Ex-Partner:   . Emotionally Abused:   Marland Kitchen Physically Abused:   . Sexually Abused:     Past Surgical History:  Procedure Laterality Date  . COLONOSCOPY  2015  . COLONOSCOPY WITH PROPOFOL N/A 09/25/2016   Procedure: COLONOSCOPY WITH PROPOFOL;  Surgeon: [name], MD;  [facility] ENDOSCOPY;  Service: Gastroenterology;  Laterality: N/A;  . COLONOSCOPY WITH PROPOFOL N/A 09/26/2016   Procedure: COLONOSCOPY WITH PROPOFOL;  Surgeon: [name], MD;  Location: [facility] ENDOSCOPY;  Service: Gastroenterology;  Laterality: N/A;  . ESOPHAGOGASTRODUODENOSCOPY (EGD) WITH PROPOFOL N/A 09/25/2016   Procedure: ESOPHAGOGASTRODUODENOSCOPY (EGD) WITH PROPOFOL;  Surgeon: [name], MD;  Location: [facility] ENDOSCOPY;  Service: Gastroenterology;  Laterality: N/A;    . RENAL ANGIOGRAPHY N/A 02/07/2019   Procedure: RENAL ANGIOGRAPHY;  Surgeon: [name], MD;  Location: [facility] CV LAB;  Service: Cardiovascular;  Laterality: N/A;  . REPLACEMENT TOTAL KNEE BILATERAL    . TOTAL VAGINAL HYSTERECTOMY     ovarian mass, not cancerous  . UPPER GI ENDOSCOPY  2015    Family History  Problem Relation Age of Onset  . Breast cancer Mother 39    Allergies  Allergen Reactions  . Losartan Other (See Comments)  Hyperkalemia  . Morphine And Related Shortness Of Breath    Rash, difficulty breathing, nausea.   . Atenolol Other (See Comments)    Other reaction(s): Other (See Comments) Decreased heart rate Decreased heart rate  . Codeine     Rash, difficulty breathing, nausea.  . Nsaids Other (See Comments)    Other reaction(s): Unknown  . Rofecoxib     Other reaction(s): Unknown  . Sucralfate Other (See Comments)    Throat tightness  . Sulfa Antibiotics     Other reaction(s): Unknown       Assessment & Plan:   1. Renal artery stenosis (HCC) Recommend:  The patient has evidence of atherosclerotic changes of the renal artery.    Patient does not need angiography of the renal artery at this time.  However, if at any point the patient's BP becomes acutely worse then intervention would be strongly encouraged.  The patient voices understanding of this plan and agrees.   Patient also has known renal cyst which is still visible today.      2. Renovascular hypertension Patient reports not taking her blood pressure medicine today due to her ultrasound and need to remain n.p.o.  Patient advised to take blood pressure as soon as possible.  3. Mixed hyperlipidemia Good lipid control is important to slowly atherosclerotic disease progression.  Patient on appropriate medications.  No changes needed today.   Current Outpatient Medications on File Prior to Visit  Medication Sig Dispense Refill  . albuterol (PROVENTIL HFA;VENTOLIN HFA) 108 (90  Base) MCG/ACT inhaler Inhale 2 puffs into the lungs every 6 (six) hours as needed for wheezing or shortness of breath.    . budesonide (PULMICORT) 0.5 MG/2ML nebulizer solution Inhale into the lungs.    . cloNIDine (CATAPRES - DOSED IN MG/24 HR) 0.2 mg/24hr patch Place 1 patch (0.2 mg total) onto the skin once a week. 4 patch 12  . clopidogrel (PLAVIX) 75 MG tablet Take 1 tablet (75 mg total) by mouth daily. 90 tablet 3  . docusate sodium (COLACE) 100 MG capsule Take 100 mg by mouth daily as needed for mild constipation. Takes daily    . doxazosin (CARDURA) 8 MG tablet Take 1 tablet (8 mg total) by mouth daily. 90 tablet 3  . erythromycin ophthalmic ointment     . furosemide (LASIX) 20 MG tablet Take 1 tablet (20 mg) by mouth once daily as needed for weight gain/ shortness of breath/ swelling    . hydrALAZINE (APRESOLINE) 25 MG tablet Take 1 tablet (25 mg) by mouth two times a day. 180 tablet 2  . ipratropium-albuterol (DUONEB) 0.5-2.5 (3) MG/3ML SOLN Take 3 mLs by nebulization every 4 (four) hours as needed (shortness of breath/wheezing.). 360 mL   . levothyroxine (SYNTHROID, LEVOTHROID) 125 MCG tablet Take 125 mcg by mouth daily.    Marland Kitchen lisinopril (ZESTRIL) 10 MG tablet Take 1 tablet (10 mg total) by mouth daily. 90 tablet 3  . montelukast (SINGULAIR) 10 MG tablet TAKE 1 TABLET BY MOUTH EVERY DAY AT NIGHT    . oxybutynin (DITROPAN-XL) 5 MG 24 hr tablet Take 5 mg by mouth daily.     . pantoprazole (PROTONIX) 20 MG tablet Take 20 mg by mouth daily.    . simvastatin (ZOCOR) 40 MG tablet Take 1 tablet (40 mg total) by mouth at bedtime. 90 tablet 2  . SYMBICORT 160-4.5 MCG/ACT inhaler Inhale 2 puffs into the lungs 2 (two) times daily.     . metFORMIN (GLUCOPHAGE) 1000 MG tablet  Take 1,000 mg by mouth 2 (two) times daily with a meal.     Current Facility-Administered Medications on File Prior to Visit  Medication Dose Route Frequency Provider Last Rate Last Admin  . 0.9 %  sodium chloride infusion    Intravenous Continuous Lloyd Huger, MD        There are no Patient Instructions on file for this visit. No follow-ups on file.   Kris Hartmann, NP

## 2019-06-08 ENCOUNTER — Telehealth: Payer: Self-pay | Admitting: Oncology

## 2019-06-08 NOTE — Telephone Encounter (Signed)
Patient phoned and stated that she needed to reschedule her appts. Appts rescheduled for 06-15-19.

## 2019-06-10 NOTE — Progress Notes (Signed)
[place]  [phone] Fax:(336) [phone]  ID: Amy [name] OB: May 29, 1927  MR#: [id number]  FEO#:[id number]  Patient Care Team: [name], MD as PCP - General (Internal Medicine) Rockey Situ Kathlene November, MD as PCP - Cardiology (Cardiology) Minna Merritts, MD as Consulting Physician (Cardiology)  CHIEF COMPLAINT: Iron deficiency anemia secondary to chronic blood loss.  INTERVAL HISTORY: Patient returns to clinic today for repeat laboratory work, further evaluation, and continuation of IV iron.  She has noticed increased weakness and fatigue over the past several weeks.  She also reports her stool is more dark and tarry recently.  She has no neurologic complaints. She denies any recent fevers or illnesses. She denies any chest pain, cough, or hemoptysis.  She reports continued chronic shortness of breath.  She denies any nausea, vomiting, constipation, or diarrhea.  She has no urinary complaints.  Patient otherwise feels well and offers no further specific complaints today.  REVIEW OF SYSTEMS:   Review of Systems  Constitutional: Positive for malaise/fatigue. Negative for fever and weight loss.  HENT: Negative.  Negative for sore throat.   Respiratory: Positive for shortness of breath. Negative for cough, hemoptysis and stridor.   Cardiovascular: Negative.  Negative for chest pain and leg swelling.  Gastrointestinal: Positive for melena. Negative for abdominal pain and blood in stool.  Genitourinary: Negative.  Negative for hematuria.  Musculoskeletal: Negative.  Negative for back pain.  Skin: Negative.  Negative for rash.  Neurological: Positive for weakness. Negative for sensory change, focal weakness and headaches.  Psychiatric/Behavioral: Negative.  The patient is not nervous/anxious.     As per HPI. Otherwise, a complete review of systems is negative.  PAST MEDICAL HISTORY: Past Medical History:  Diagnosis Date   Bladder incontinence      CAD (coronary artery disease)    a. 05/2009 Cath: LAD 90p (Xience 2.75 x 12 mm DES), 19m D1 40, LCx 40p/m, RCA 30/40/30p/m, RCA 30/25d;  b.  12/2011 Lexiscan MV: no ischemia, breast attenuation artifact, normal EF-->Low risk; c. 10/2016 MV: fixed apical defect, most likely apical thinning and attenuation, No ischemia, EF 60%.   Cancer ([facility]    ovarian   Carotid arterial disease w/ R Carotid Bruit (HCC)    a. 08/2016 Carotid U/S: 40-59% bilat ICA stenosis - f/u 1 yr.   Chronic diastolic (congestive) heart failure (HEpes    a. Echo 09/2015: EF 55-60% w/ Grade 1 DD, sev Ca2+ MV annulus, mildly dil LA; b. 09/2016 Echo: EF 65-70%, Gr2 DD, mildly dil LA/RA, nl RV fxn.   Chronic Dyspnea on exertion    CKD (chronic kidney disease) stage 3, GFR 30-59 ml/min    Degenerative arthritis of knee    bilateral knees   Diabetes mellitus    Type II   GIB (gastrointestinal bleeding)    a. 08/2016 Admission w/ presyncope/anemia/melena-->req Transfusion-->endo ok, colonoscopy w/ polyps but no source of bleeding (most likely diverticular).   Hiatal hernia    Hypertension    Iron deficiency    Menopausal symptoms    [place]    Renal insufficiency    Thyroid disease    hypothyroidism    PAST SURGICAL HISTORY: Past Surgical History:  Procedure Laterality Date   COLONOSCOPY  2015   COLONOSCOPY WITH PROPOFOL N/A 09/25/2016   Procedure: COLONOSCOPY WITH PROPOFOL;  Surgeon: AJonathon Bellows MD;  [facility]  Service: Gastroenterology;  Laterality: N/A;   COLONOSCOPY WITH PROPOFOL N/A 09/26/2016   Procedure: COLONOSCOPY WITH PROPOFOL;  Surgeon: [name], MD;  Location: [facility] ENDOSCOPY;  Service: Gastroenterology;  Laterality: N/A;   ESOPHAGOGASTRODUODENOSCOPY (EGD) WITH PROPOFOL N/A 09/25/2016   Procedure: ESOPHAGOGASTRODUODENOSCOPY (EGD) WITH PROPOFOL;  Surgeon: [name], MD;  Location: [facility] ENDOSCOPY;  Service: Gastroenterology;  Laterality: N/A;   RENAL ANGIOGRAPHY  N/A 02/07/2019   Procedure: RENAL ANGIOGRAPHY;  Surgeon: [name], MD;  Location: [facility] LAB;  Service: Cardiovascular;  Laterality: N/A;   REPLACEMENT TOTAL KNEE BILATERAL     TOTAL VAGINAL HYSTERECTOMY     ovarian mass, not cancerous   UPPER GI ENDOSCOPY  2015    FAMILY HISTORY Family History  Problem Relation Age of Onset   Breast cancer Mother 26       ADVANCED DIRECTIVES:    HEALTH MAINTENANCE: Social History   Tobacco Use   Smoking status: Never Smoker   Smokeless tobacco: Never Used  Substance Use Topics   Alcohol use: No   Drug use: No     Colonoscopy:  PAP:  Bone density:  Lipid panel:  Allergies  Allergen Reactions   Losartan Other (See Comments)    Hyperkalemia   Morphine And Related Shortness Of Breath    Rash, difficulty breathing, nausea.    Atenolol Other (See Comments)    Other reaction(s): Other (See Comments) Decreased heart rate Decreased heart rate   Codeine     Rash, difficulty breathing, nausea.   Nsaids Other (See Comments)    Other reaction(s): Unknown   Rofecoxib     Other reaction(s): Unknown   Sucralfate Other (See Comments)    Throat tightness   Sulfa Antibiotics     Other reaction(s): Unknown    Current Outpatient Medications  Medication Sig Dispense Refill   albuterol (PROVENTIL HFA;VENTOLIN HFA) 108 (90 Base) MCG/ACT inhaler Inhale 2 puffs into the lungs every 6 (six) hours as needed for wheezing or shortness of breath.     budesonide (PULMICORT) 0.5 MG/2ML nebulizer solution Inhale into the lungs.     cloNIDine (CATAPRES - DOSED IN MG/24 HR) 0.2 mg/24hr patch Place 1 patch (0.2 mg total) onto the skin once a week. 4 patch 12   clopidogrel (PLAVIX) 75 MG tablet Take 1 tablet (75 mg total) by mouth daily. 90 tablet 3   docusate sodium (COLACE) 100 MG capsule Take 100 mg by mouth daily as needed for mild constipation. Takes daily     doxazosin (CARDURA) 8 MG tablet Take 1 tablet (8 mg  total) by mouth daily. 90 tablet 3   erythromycin ophthalmic ointment      furosemide (LASIX) 20 MG tablet Take 1 tablet (20 mg) by mouth once daily as needed for weight gain/ shortness of breath/ swelling     hydrALAZINE (APRESOLINE) 100 MG tablet Take 100 mg by mouth 3 (three) times daily.     ipratropium-albuterol (DUONEB) 0.5-2.5 (3) MG/3ML SOLN Take 3 mLs by nebulization every 4 (four) hours as needed (shortness of breath/wheezing.). 360 mL    levothyroxine (SYNTHROID, LEVOTHROID) 125 MCG tablet Take 125 mcg by mouth daily.     lisinopril (ZESTRIL) 10 MG tablet Take 1 tablet (10 mg total) by mouth daily. 90 tablet 3   montelukast (SINGULAIR) 10 MG tablet TAKE 1 TABLET BY MOUTH EVERY DAY AT NIGHT     oxybutynin (DITROPAN-XL) 5 MG 24 hr tablet Take 5 mg by mouth daily.      pantoprazole (PROTONIX) 20 MG tablet Take 20 mg by mouth daily.     simvastatin (ZOCOR) 40 MG  tablet Take 1 tablet (40 mg total) by mouth at bedtime. 90 tablet 2   SYMBICORT 160-4.5 MCG/ACT inhaler Inhale 2 puffs into the lungs 2 (two) times daily.      metFORMIN (GLUCOPHAGE) 1000 MG tablet Take 1,000 mg by mouth 2 (two) times daily with a meal.     No current facility-administered medications for this visit.   Facility-Administered Medications Ordered in Other Visits  Medication Dose Route Frequency Provider Last Rate Last Admin   0.9 %  sodium chloride infusion   Intravenous Continuous Lloyd Huger, MD        OBJECTIVE: Vitals:   06/15/19 1046  BP: (!) 181/42  Pulse: (!) 58  Resp: 18  Temp: 98.6 F (37 C)  SpO2: 100%     Body mass index is 33 kg/m.    ECOG FS:2 - Symptomatic, <50% confined to bed  General: Well-developed, well-nourished, no acute distress.  Sitting in a wheelchair. Eyes: Pink conjunctiva, anicteric sclera. HEENT: Normocephalic, moist mucous membranes. Lungs: No audible wheezing or coughing. Heart: Regular rate and rhythm. Abdomen: Soft, nontender, no obvious  distention. Musculoskeletal: No edema, cyanosis, or clubbing. Neuro: Alert, answering all questions appropriately. Cranial nerves grossly intact. Skin: No rashes or petechiae noted. Psych: Normal affect.   LAB RESULTS:  Lab Results  Component Value Date   NA 141 03/23/2019   K 4.8 03/23/2019   CL 109 03/23/2019   CO2 22 03/23/2019   GLUCOSE 128 (H) 03/23/2019   BUN 62 (H) 03/23/2019   CREATININE 1.96 (H) 03/23/2019   CALCIUM 8.7 (L) 03/23/2019   PROT 5.9 (L) 05/15/2017   ALBUMIN 2.9 (L) 05/15/2017   AST 26 05/15/2017   ALT 11 (L) 05/15/2017   ALKPHOS 70 05/15/2017   BILITOT 0.5 05/15/2017   GFRNONAA 22 (L) 03/23/2019   GFRAA 25 (L) 03/23/2019    Lab Results  Component Value Date   WBC 5.5 06/15/2019   NEUTROABS 3.5 06/15/2019   HGB 7.8 (L) 06/15/2019   HCT 24.5 (L) 06/15/2019   MCV 98.8 06/15/2019   PLT 164 06/15/2019   Lab Results  Component Value Date   IRON 47 06/15/2019   TIBC 197 (L) 06/15/2019   IRONPCTSAT 24 06/15/2019    Lab Results  Component Value Date   FERRITIN 207 06/15/2019    STUDIES: VAS US RENAL ARTERY DUPLEX  Result Date: 06/10/2019 ABDOMINAL VISCERAL Limitations: Air/bowel gas, obesity and Patient had difficulty holding breath and holding still. Comparison Study: 01/11/2019 Performing Technologist: Charlane Ferretti RT (R)(VS)  Examination Guidelines: A complete evaluation includes B-mode imaging, spectral Doppler, color Doppler, and power Doppler as needed of all accessible portions of each vessel. Bilateral testing is considered an integral part of a complete examination. Limited examinations for reoccurring indications may be performed as noted.  Duplex Findings: +----------+--------+--------+------+--------+  Mesenteric PSV cm/s EDV cm/s Plaque Comments  +----------+--------+--------+------+--------+  Aorta Mid    196                              +----------+--------+--------+------+--------+  +------------------+--------+--------+-------+   Right Renal Artery PSV cm/s EDV cm/s Comment  +------------------+--------+--------+-------+  Proximal              99                      +------------------+--------+--------+-------+  Mid  147                      +------------------+--------+--------+-------+  Distal               112                      +------------------+--------+--------+-------+ +-----------------+--------+--------+-------+  Left Renal Artery PSV cm/s EDV cm/s Comment  +-----------------+--------+--------+-------+  Proximal            199                      +-----------------+--------+--------+-------+  Mid                 159                      +-----------------+--------+--------+-------+  Distal              194                      +-----------------+--------+--------+-------+ Technologist observations: Right renal cysts. The largest measuring approximately 2.88cm x 1.63 cm x 1.95cm with no internal echoes. Left renal cyst with the largest measuring approximatelt 2.53cm x 2.75cm x 2.52cm with no internal echoes. Renal ratios are not reliable due to elevated velocities in the arota. No change in velocities as compared to the previous exam on 01/11/2019.  +------------------+-----+------------------+-----+  Right Kidney             Left Kidney               +------------------+-----+------------------+-----+  RAR (manual)       .75   RAR (manual)       1.02   +------------------+-----+------------------+-----+  Kidney length (cm) 11.30 Kidney length (cm) 11.30  +------------------+-----+------------------+-----+ Summary: Renal:  Right: Normal size right kidney. RRV flow present. No evidence of        right renal artery stenosis. Left:  Normal size of left kidney. LRV flow present. 1-59% stenosis        of the left renal artery.  *See table(s) above for measurements and observations.  Diagnosing physician: Leotis Pain MD  Electronically signed by Leotis Pain MD on 06/10/2019 at 1:03:24 PM.    Final     ASSESSMENT:   Iron deficiency anemia secondary to chronic blood loss, shortness of breath.   PLAN:    1. Iron deficiency anemia secondary to chronic blood loss: Patient's hemoglobin continues to trend down and is now 7.8.  Her iron stores continue to be within normal limits, but given her report of increasing melena, will proceed with additional IV Feraheme today.  Previously, patient was reported to be intolerant of oral iron supplementation.  Patient had colonoscopy and EGD in August 2018 which was unrevealing.  Given her mild renal insufficiency, she also may benefit from Retacrit in the future.  Previously, we also discussed bone marrow biopsy, but patient has declined.  Return to clinic in 1 week for second infusion of Feraheme and then in 6 weeks with repeat laboratory work and further evaluation.   2. Pulmonary nodules: Repeat CT scan from January 13, 2017 revealed stable pulmonary nodules.  No further imaging is necessary.   3. Renal insufficiency: Chronic and unchanged.  Consider Retacrit as above. 4.  Hypertension: Blood pressure is moderately elevated today.  Patient also noted to have a significantly widened pulse pressure.  Unclear etiology.  Continue treatment  and monitoring per primary care. 5.  History of GI bleed: Colonoscopy and EGD in August 2018.  Continue follow-up with GI as indicated.  Patient expressed understanding and was in agreement with this plan. She also understands that She can call clinic at any time with any questions, concerns, or complaints.    Lloyd Huger, MD   06/15/2019 11:20 AM

## 2019-06-14 ENCOUNTER — Encounter: Payer: Self-pay | Admitting: Oncology

## 2019-06-14 NOTE — Progress Notes (Signed)
Patient states she noticed some dark stools for the past week. She believes it may be blood. She also states she is feeling very weak. She denies any pain or other concerns at this time.

## 2019-06-15 ENCOUNTER — Other Ambulatory Visit: Payer: Self-pay

## 2019-06-15 ENCOUNTER — Inpatient Hospital Stay: Payer: Medicare Other

## 2019-06-15 ENCOUNTER — Inpatient Hospital Stay: Payer: Medicare Other | Attending: Oncology

## 2019-06-15 ENCOUNTER — Inpatient Hospital Stay (HOSPITAL_BASED_OUTPATIENT_CLINIC_OR_DEPARTMENT_OTHER): Payer: Medicare Other | Admitting: Oncology

## 2019-06-15 VITALS — BP 181/42 | HR 58 | Temp 98.6°F | Resp 18 | Wt 180.4 lb

## 2019-06-15 VITALS — BP 137/68 | HR 62 | Temp 97.0°F | Resp 19

## 2019-06-15 DIAGNOSIS — Z7984 Long term (current) use of oral hypoglycemic drugs: Secondary | ICD-10-CM | POA: Diagnosis not present

## 2019-06-15 DIAGNOSIS — I110 Hypertensive heart disease with heart failure: Secondary | ICD-10-CM | POA: Insufficient documentation

## 2019-06-15 DIAGNOSIS — N183 Chronic kidney disease, stage 3 unspecified: Secondary | ICD-10-CM | POA: Diagnosis not present

## 2019-06-15 DIAGNOSIS — E119 Type 2 diabetes mellitus without complications: Secondary | ICD-10-CM | POA: Diagnosis not present

## 2019-06-15 DIAGNOSIS — D500 Iron deficiency anemia secondary to blood loss (chronic): Secondary | ICD-10-CM | POA: Diagnosis not present

## 2019-06-15 DIAGNOSIS — K921 Melena: Secondary | ICD-10-CM | POA: Diagnosis not present

## 2019-06-15 DIAGNOSIS — D509 Iron deficiency anemia, unspecified: Secondary | ICD-10-CM

## 2019-06-15 DIAGNOSIS — I701 Atherosclerosis of renal artery: Secondary | ICD-10-CM

## 2019-06-15 DIAGNOSIS — Z79899 Other long term (current) drug therapy: Secondary | ICD-10-CM | POA: Insufficient documentation

## 2019-06-15 DIAGNOSIS — D649 Anemia, unspecified: Secondary | ICD-10-CM

## 2019-06-15 DIAGNOSIS — I5032 Chronic diastolic (congestive) heart failure: Secondary | ICD-10-CM | POA: Diagnosis not present

## 2019-06-15 DIAGNOSIS — R918 Other nonspecific abnormal finding of lung field: Secondary | ICD-10-CM | POA: Insufficient documentation

## 2019-06-15 DIAGNOSIS — E039 Hypothyroidism, unspecified: Secondary | ICD-10-CM | POA: Diagnosis not present

## 2019-06-15 DIAGNOSIS — Z7951 Long term (current) use of inhaled steroids: Secondary | ICD-10-CM | POA: Insufficient documentation

## 2019-06-15 LAB — CBC WITH DIFFERENTIAL/PLATELET
Abs Immature Granulocytes: 0.02 10*3/uL (ref 0.00–0.07)
Basophils Absolute: 0 10*3/uL (ref 0.0–0.1)
Basophils Relative: 0 %
Eosinophils Absolute: 0.2 10*3/uL (ref 0.0–0.5)
Eosinophils Relative: 3 %
HCT: 24.5 % — ABNORMAL LOW (ref 36.0–46.0)
Hemoglobin: 7.8 g/dL — ABNORMAL LOW (ref 12.0–15.0)
Immature Granulocytes: 0 %
Lymphocytes Relative: 23 %
Lymphs Abs: 1.2 10*3/uL (ref 0.7–4.0)
MCH: 31.5 pg (ref 26.0–34.0)
MCHC: 31.8 g/dL (ref 30.0–36.0)
MCV: 98.8 fL (ref 80.0–100.0)
Monocytes Absolute: 0.6 10*3/uL (ref 0.1–1.0)
Monocytes Relative: 10 %
Neutro Abs: 3.5 10*3/uL (ref 1.7–7.7)
Neutrophils Relative %: 64 %
Platelets: 164 10*3/uL (ref 150–400)
RBC: 2.48 MIL/uL — ABNORMAL LOW (ref 3.87–5.11)
RDW: 12.5 % (ref 11.5–15.5)
WBC: 5.5 10*3/uL (ref 4.0–10.5)
nRBC: 0 % (ref 0.0–0.2)

## 2019-06-15 LAB — IRON AND TIBC
Iron: 47 ug/dL (ref 28–170)
Saturation Ratios: 24 % (ref 10.4–31.8)
TIBC: 197 ug/dL — ABNORMAL LOW (ref 250–450)
UIBC: 150 ug/dL

## 2019-06-15 LAB — SAMPLE TO BLOOD BANK

## 2019-06-15 LAB — FERRITIN: Ferritin: 207 ng/mL (ref 11–307)

## 2019-06-15 MED ORDER — SODIUM CHLORIDE 0.9 % IV SOLN
Freq: Once | INTRAVENOUS | Status: AC
  Filled 2019-06-15: qty 250

## 2019-06-15 MED ORDER — SODIUM CHLORIDE 0.9 % IV SOLN
510.0000 mg | Freq: Once | INTRAVENOUS | Status: AC
  Administered 2019-06-15: 510 mg via INTRAVENOUS
  Filled 2019-06-15: qty 510

## 2019-06-15 NOTE — Progress Notes (Signed)
Patient here for follow up with oncologist, pain 5/10 in right knee. Complains of baseline weakness. Systolic BP elevated patient states she took her BP medication this morning. MD notified. No other complaints.

## 2019-06-15 NOTE — Progress Notes (Signed)
Pt tolerated Feraheme infusion well with no signs of complications or signs of reaction. RN educated pt on the importance of notifying the clinic if any complications occur at home. VSS. Pt stable for discharge.   Teagan Heidrick [employer]

## 2019-06-21 ENCOUNTER — Ambulatory Visit: Payer: Medicare Other

## 2019-06-21 ENCOUNTER — Ambulatory Visit: Payer: Medicare Other | Admitting: Oncology

## 2019-06-21 ENCOUNTER — Other Ambulatory Visit: Payer: Medicare Other

## 2019-06-22 ENCOUNTER — Inpatient Hospital Stay: Payer: Medicare Other

## 2019-06-22 ENCOUNTER — Other Ambulatory Visit: Payer: Self-pay

## 2019-06-22 ENCOUNTER — Telehealth: Payer: Self-pay

## 2019-06-22 VITALS — BP 185/68 | HR 68 | Temp 97.0°F | Resp 20

## 2019-06-22 DIAGNOSIS — D500 Iron deficiency anemia secondary to blood loss (chronic): Secondary | ICD-10-CM

## 2019-06-22 MED ORDER — SODIUM CHLORIDE 0.9 % IV SOLN
510.0000 mg | Freq: Once | INTRAVENOUS | Status: AC
  Administered 2019-06-22: 11:00:00 510 mg via INTRAVENOUS
  Filled 2019-06-22: qty 510

## 2019-06-22 MED ORDER — SODIUM CHLORIDE 0.9 % IV SOLN
INTRAVENOUS | Status: DC
  Filled 2019-06-22: qty 250

## 2019-06-22 NOTE — Telephone Encounter (Signed)
Telephone call to patient to schedule palliative care visit with patient. Patient/family in agreement with home visit on 06-28-19 at 11:00AM.

## 2019-06-23 ENCOUNTER — Ambulatory Visit: Payer: Medicare Other | Admitting: Podiatry

## 2019-06-27 ENCOUNTER — Ambulatory Visit (INDEPENDENT_AMBULATORY_CARE_PROVIDER_SITE_OTHER): Payer: Medicare Other | Admitting: Podiatry

## 2019-06-27 ENCOUNTER — Encounter: Payer: Self-pay | Admitting: Podiatry

## 2019-06-27 ENCOUNTER — Other Ambulatory Visit: Payer: Self-pay

## 2019-06-27 DIAGNOSIS — B351 Tinea unguium: Secondary | ICD-10-CM | POA: Diagnosis not present

## 2019-06-27 DIAGNOSIS — M79676 Pain in unspecified toe(s): Secondary | ICD-10-CM | POA: Diagnosis not present

## 2019-06-27 DIAGNOSIS — L608 Other nail disorders: Secondary | ICD-10-CM

## 2019-06-27 DIAGNOSIS — E119 Type 2 diabetes mellitus without complications: Secondary | ICD-10-CM | POA: Diagnosis not present

## 2019-06-27 DIAGNOSIS — D689 Coagulation defect, unspecified: Secondary | ICD-10-CM

## 2019-06-27 NOTE — Progress Notes (Signed)
This patient returns to my office for at risk foot care.  This patient requires this care by a professional since this patient will be at risk due to having chronic kidney disease diabetes and coagulation defect.  Patient is taking plavix.  This patient is unable to cut nails herself since the patient cannot reach her nails.These nails are painful walking and wearing shoes.  This patient presents for at risk foot care today.  General Appearance  Alert, conversant and in no acute stress.  Vascular  Dorsalis pedis and posterior tibial  pulses are palpable  bilaterally.  Capillary return is within normal limits  bilaterally. Temperature is within normal limits  bilaterally.  Neurologic  Senn-Weinstein monofilament wire test within normal limits  bilaterally. Muscle power within normal limits bilaterally.  Nails Thick disfigured discolored nails with subungual debris  from hallux to fifth toes bilaterally. No evidence of bacterial infection or drainage bilaterally.  Orthopedic  No limitations of motion  feet .  No crepitus or effusions noted.  No bony pathology or digital deformities noted.  Skin  normotropic skin with no porokeratosis noted bilaterally.  No signs of infections or ulcers noted.     Onychomycosis  Pain in right toes  Pain in left toes  Consent was obtained for treatment procedures.   Mechanical debridement of nails 1-5  bilaterally performed with a nail nipper.  Filed with dremel without incident.    Return office visit   3 months                   Told patient to return for periodic foot care and evaluation due to potential at risk complications.   Gardiner Barefoot DPM

## 2019-06-28 ENCOUNTER — Other Ambulatory Visit: Payer: Medicare Other

## 2019-06-28 DIAGNOSIS — Z515 Encounter for palliative care: Secondary | ICD-10-CM

## 2019-06-28 NOTE — Progress Notes (Signed)
COMMUNITY PALLIATIVE CARE SW NOTE  PATIENT NAME: Amy [name] DOB: 1927-05-29 MRN: [id number]  PRIMARY CARE PROVIDER: [name], MD  RESPONSIBLE PARTY:  Acct ID - Guarantor Home Phone Work Phone Relationship Acct Type  [id number] Amy [name],* [phone]  Self P/F     [address], [place], [place] [address]     PLAN OF CARE and INTERVENTIONS:             1. GOALS OF CARE/ ADVANCE CARE PLANNING:  Patient is DNR.MOST form is complete.Patient's goal is to remain in her home. 2. SOCIAL/EMOTIONAL/SPIRITUAL ASSESSMENT/ INTERVENTIONS:  SW met with patient in the home. Patient denies pain. Patient reports ongoing SOB with exertion. Patient denies cough. Patient noted she feels weak and tired. Patient reports she is sleeping fair, does wake up every two hours to go to the bathroom. Patient said she does nap some during the day. Patient's appetite is fair, eating about two meals per day and has multiple snacks. Patient has received her COVID vaccines, no concerns. SW provided emotional support, reviewed goals, and used active and reflective listening. 3. PATIENT/CAREGIVER EDUCATION/ COPING:  Patient was alert, engaged with SW. Patient was pleasant. Patient is open with expressing feelings. Patient enjoys reading the books from [employer], and enjoys time outdoors with family. Family is supportive, daughter brings patient's groceries and runs errands. Patient's son helps clean her home.  4. PERSONAL EMERGENCY PLAN:  Patient has [place] and family will call 9-1-1 for emergencies.  5. COMMUNITY RESOURCES COORDINATION/ HEALTH CARE NAVIGATION:  Patient coordinates her care. Daughter assists with transportation to appointments. Patient is scheduled to see PCP on 5/22. Patient's next infusion is scheduled for first week of June.  6. FINANCIAL/LEGAL CONCERNS/INTERVENTIONS:  None.     SOCIAL HX:  Social History   Tobacco Use  . Smoking status: Never Smoker  . Smokeless tobacco: Never  Used  Substance Use Topics  . Alcohol use: No    CODE STATUS: DNR ADVANCED DIRECTIVES: Y MOST FORM COMPLETE:  Yes. HOSPICE EDUCATION PROVIDED: None.  PPS: Patient is independent of most ADLs.Patient uses her walker or a wheelchair when she goes out of the home.Patient is weaker.   I spent45 minutes with patient/family, from10:30-11:15a providing education, support and consultation.  Margaretmary Lombard, LCSW

## 2019-07-22 ENCOUNTER — Telehealth: Payer: Self-pay

## 2019-07-22 NOTE — Progress Notes (Signed)
[facility]  [phone] [phone]  ID: Amy [name] OB: 01/12/1928  MR#: [id number]  HWE#:[id number]  Patient Care Team: [name], MD as PCP - General (Internal Medicine) Rockey Situ Kathlene November, MD as PCP - Cardiology (Cardiology) Minna Merritts, MD as Consulting Physician (Cardiology)  CHIEF COMPLAINT: Anemia secondary to chronic renal insufficiency.  INTERVAL HISTORY: Patient returns to clinic today for repeat laboratory, further evaluation and consideration of treatment.  She was in her usual state of health until yesterday when she developed cough and congestion.  She denies any fevers.  She has increased shortness of breath and wheezing.  Her weakness and fatigue are slightly worse.  She has no neurologic complaints.  She denies any chest pain or hemoptysis. She denies any nausea, vomiting, constipation, or diarrhea.  She has no urinary complaints.  Patient offers no further specific complaints today.  REVIEW OF SYSTEMS:   Review of Systems  Constitutional: Positive for malaise/fatigue. Negative for fever and weight loss.  HENT: Positive for congestion. Negative for sore throat.   Respiratory: Positive for cough and shortness of breath. Negative for hemoptysis and stridor.   Cardiovascular: Negative.  Negative for chest pain and leg swelling.  Gastrointestinal: Negative for abdominal pain, blood in stool and melena.  Genitourinary: Negative.  Negative for hematuria.  Musculoskeletal: Negative.  Negative for back pain.  Skin: Negative.  Negative for rash.  Neurological: Positive for weakness. Negative for sensory change, focal weakness and headaches.  Psychiatric/Behavioral: Negative.  The patient is not nervous/anxious.     As per HPI. Otherwise, a complete review of systems is negative.  PAST MEDICAL HISTORY: Past Medical History:  Diagnosis Date  . Bladder incontinence   . CAD (coronary artery disease)    a. 05/2009 Cath:  LAD 90p (Xience 2.75 x 12 mm DES), 4m, D1 40, LCx 40p/m, RCA 30/40/30p/m, RCA 30/25d;  b.  12/2011 Lexiscan MV: no ischemia, breast attenuation artifact, normal EF-->Low risk; c. 10/2016 MV: fixed apical defect, most likely apical thinning and attenuation, No ischemia, EF 60%.  . Cancer ([place])    ovarian  . Carotid arterial disease w/ R Carotid Bruit (HCC)    a. 08/2016 Carotid U/S: 40-59% bilat ICA stenosis - f/u 1 yr.  . Chronic diastolic (congestive) heart failure ([place])    a. Echo 09/2015: EF 55-60% w/ Grade 1 DD, sev Ca2+ MV annulus, mildly dil LA; b. 09/2016 Echo: EF 65-70%, Gr2 DD, mildly dil LA/RA, nl RV fxn.  . Chronic Dyspnea on exertion   . CKD (chronic kidney disease) stage 3, GFR 30-59 ml/min   . Degenerative arthritis of knee    bilateral knees  . Diabetes mellitus    Type II  . GIB (gastrointestinal bleeding)    a. 08/2016 Admission w/ presyncope/anemia/melena-->req Transfusion-->endo ok, colonoscopy w/ polyps but no source of bleeding (most likely diverticular).  . Hiatal hernia   . Hypertension   . Iron deficiency   . Menopausal symptoms   . Morbid obesity ([place])   . Renal insufficiency   . Thyroid disease    hypothyroidism    PAST SURGICAL HISTORY: Past Surgical History:  Procedure Laterality Date  . COLONOSCOPY  2015  . COLONOSCOPY WITH PROPOFOL N/A 09/25/2016   Procedure: COLONOSCOPY WITH PROPOFOL;  Surgeon: [name], MD;  Location: [facility] ENDOSCOPY;  Service: Gastroenterology;  Laterality: N/A;  . COLONOSCOPY WITH PROPOFOL N/A 09/26/2016   Procedure: COLONOSCOPY WITH PROPOFOL;  Surgeon: [name], MD;  Location: [facility] ENDOSCOPY;  Service:  Gastroenterology;  Laterality: N/A;  . ESOPHAGOGASTRODUODENOSCOPY (EGD) WITH PROPOFOL N/A 09/25/2016   Procedure: ESOPHAGOGASTRODUODENOSCOPY (EGD) WITH PROPOFOL;  Surgeon: [name], MD;  Location: [facility] ENDOSCOPY;  Service: Gastroenterology;  Laterality: N/A;  . RENAL ANGIOGRAPHY N/A 02/07/2019   Procedure: RENAL ANGIOGRAPHY;   Surgeon: [name], MD;  Location: [facility] CV LAB;  Service: Cardiovascular;  Laterality: N/A;  . REPLACEMENT TOTAL KNEE BILATERAL    . TOTAL VAGINAL HYSTERECTOMY     ovarian mass, not cancerous  . UPPER GI ENDOSCOPY  2015    FAMILY HISTORY Family History  Problem Relation Age of Onset  . Breast cancer Mother 38       ADVANCED DIRECTIVES:    HEALTH MAINTENANCE: Social History   Tobacco Use  . Smoking status: Never Smoker  . Smokeless tobacco: Never Used  Substance Use Topics  . Alcohol use: No  . Drug use: No     Colonoscopy:  PAP:  Bone density:  Lipid panel:  Allergies  Allergen Reactions  . Losartan Other (See Comments)    Hyperkalemia  . Morphine And Related Shortness Of Breath    Rash, difficulty breathing, nausea.   . Atenolol Other (See Comments)    Other reaction(s): Other (See Comments) Decreased heart rate Decreased heart rate  . Codeine     Rash, difficulty breathing, nausea.  . Nsaids Other (See Comments)    Other reaction(s): Unknown  . Rofecoxib     Other reaction(s): Unknown  . Sucralfate Other (See Comments)    Throat tightness  . Sulfa Antibiotics     Other reaction(s): Unknown    Current Outpatient Medications  Medication Sig Dispense Refill  . albuterol (PROVENTIL HFA;VENTOLIN HFA) 108 (90 Base) MCG/ACT inhaler Inhale 2 puffs into the lungs every 6 (six) hours as needed for wheezing or shortness of breath.    . budesonide (PULMICORT) 0.5 MG/2ML nebulizer solution Inhale into the lungs.    . cloNIDine (CATAPRES - DOSED IN MG/24 HR) 0.2 mg/24hr patch Place 1 patch (0.2 mg total) onto the skin once a week. 4 patch 12  . clopidogrel (PLAVIX) 75 MG tablet Take 1 tablet (75 mg total) by mouth daily. 90 tablet 3  . docusate sodium (COLACE) 100 MG capsule Take 100 mg by mouth daily as needed for mild constipation. Takes daily    . doxazosin (CARDURA) 8 MG tablet Take 1 tablet (8 mg total) by mouth daily. 90 tablet 3  . erythromycin  ophthalmic ointment     . furosemide (LASIX) 20 MG tablet Take 1 tablet (20 mg) by mouth once daily as needed for weight gain/ shortness of breath/ swelling    . hydrALAZINE (APRESOLINE) 100 MG tablet Take 100 mg by mouth 3 (three) times daily.    Marland Kitchen ipratropium-albuterol (DUONEB) 0.5-2.5 (3) MG/3ML SOLN Take 3 mLs by nebulization every 4 (four) hours as needed (shortness of breath/wheezing.). 360 mL   . levothyroxine (SYNTHROID) 88 MCG tablet     . levothyroxine (SYNTHROID, LEVOTHROID) 125 MCG tablet Take 125 mcg by mouth daily.    Marland Kitchen lisinopril (ZESTRIL) 10 MG tablet Take 1 tablet (10 mg total) by mouth daily. 90 tablet 3  . metFORMIN (GLUCOPHAGE) 1000 MG tablet Take 1,000 mg by mouth 2 (two) times daily with a meal.    . montelukast (SINGULAIR) 10 MG tablet TAKE 1 TABLET BY MOUTH EVERY DAY AT NIGHT    . oxybutynin (DITROPAN-XL) 5 MG 24 hr tablet Take 5 mg by mouth daily.     Marland Kitchen  pantoprazole (PROTONIX) 20 MG tablet Take 20 mg by mouth daily.    . simvastatin (ZOCOR) 40 MG tablet Take 1 tablet (40 mg total) by mouth at bedtime. 90 tablet 2  . SYMBICORT 160-4.5 MCG/ACT inhaler Inhale 2 puffs into the lungs 2 (two) times daily.      No current facility-administered medications for this visit.   Facility-Administered Medications Ordered in Other Visits  Medication Dose Route Frequency Provider Last Rate Last Admin  . 0.9 %  sodium chloride infusion   Intravenous Continuous Lloyd Huger, MD      . epoetin alfa-epbx (RETACRIT) injection 40,000 Units  40,000 Units Subcutaneous Once Lloyd Huger, MD        OBJECTIVE: Vitals:   07/28/19 1034  BP: (!) 156/36  Pulse: 70  Temp: (!) 97.5 F (36.4 C)  SpO2: 99%     Body mass index is 32.06 kg/m.    ECOG FS:2 - Symptomatic, <50% confined to bed  General: Well-developed, well-nourished, no acute distress. Eyes: Pink conjunctiva, anicteric sclera. HEENT: Normocephalic, moist mucous membranes. Lungs: Scattered wheezing  throughout. Heart: Regular rate and rhythm. Abdomen: Soft, nontender, no obvious distention. Musculoskeletal: No edema, cyanosis, or clubbing. Neuro: Alert, answering all questions appropriately. Cranial nerves grossly intact. Skin: No rashes or petechiae noted. Psych: Normal affect.  LAB RESULTS:  Lab Results  Component Value Date   NA 142 07/27/2019   K 5.2 (H) 07/27/2019   CL 110 07/27/2019   CO2 23 07/27/2019   GLUCOSE 135 (H) 07/27/2019   BUN 45 (H) 07/27/2019   CREATININE 2.45 (H) 07/27/2019   CALCIUM 8.6 (L) 07/27/2019   PROT 6.0 (L) 07/27/2019   ALBUMIN 3.5 07/27/2019   AST 17 07/27/2019   ALT 14 07/27/2019   ALKPHOS 75 07/27/2019   BILITOT 0.5 07/27/2019   GFRNONAA 17 (L) 07/27/2019   GFRAA 19 (L) 07/27/2019    Lab Results  Component Value Date   WBC 8.1 07/27/2019   NEUTROABS 3.5 06/15/2019   HGB 8.2 (L) 07/27/2019   HCT 25.6 (L) 07/27/2019   MCV 98.5 07/27/2019   PLT 177 07/27/2019   Lab Results  Component Value Date   IRON 47 07/27/2019   TIBC 186 (L) 07/27/2019   IRONPCTSAT 25 07/27/2019    Lab Results  Component Value Date   FERRITIN 611 (H) 07/27/2019    STUDIES: DG Chest 2 View  Result Date: 07/28/2019 CLINICAL DATA:  Shortness of breath EXAM: CHEST - 2 VIEW COMPARISON:  01/29/2018 FINDINGS: The heart size and mediastinal contours are mildly enlarged, stable. Atherosclerotic calcification of the aortic knob. Moderate-sized hiatal hernia is again noted. No focal airspace consolidation, pleural effusion, or pneumothorax. The visualized skeletal structures are within normal limits. IMPRESSION: No active cardiopulmonary disease. Electronically Signed   By: Davina Poke D.O.   On: 07/28/2019 13:11    ASSESSMENT: Anemia secondary to chronic renal insufficiency.  PLAN:    1.  Anemia secondary to chronic renal insufficiency: Patient's iron stores are now within normal limits.  Her hemoglobin remains significantly decreased at 8.2, although  improved from previous.  She does not require additional IV iron today.  She last received treatment with Feraheme on June 22, 2019.  Given her persistent anemia and chronic renal insufficiency, will proceed with 40,000 units Retacrit today.  Return to clinic in 1 month with repeat laboratory work, further evaluation, and continuation of treatment if needed.  2. Pulmonary nodules: Repeat CT scan from January 13, 2017 revealed stable pulmonary  nodules.  No further imaging is necessary.   3. Renal insufficiency: Creatinine significantly worse today.  Call was placed to nephrology to move her appointment up for further evaluation. 4.  Hypertension: Blood pressure mildly elevated today.  Proceed with treatment as above. 5.  History of GI bleed: Colonoscopy and EGD in August 2018.  Continue follow-up with GI as indicated. 6.  Shortness of breath/cough/congestion: No active cardiopulmonary disease on chest x-ray today.  Have recommended symptomatic treatment and follow-up with primary care if symptoms become worse.  Patient expressed understanding and was in agreement with this plan. She also understands that She can call clinic at any time with any questions, concerns, or complaints.    Lloyd Huger, MD   07/29/2019 6:09 AM

## 2019-07-22 NOTE — Telephone Encounter (Signed)
Telephone call to patient to schedule palliative care visit.  Patient in agreement with RN making home visit 08/05/19 at 9:00 AM.

## 2019-07-27 ENCOUNTER — Inpatient Hospital Stay: Payer: Medicare Other | Attending: Oncology

## 2019-07-27 ENCOUNTER — Encounter: Payer: Self-pay | Admitting: Oncology

## 2019-07-27 ENCOUNTER — Other Ambulatory Visit: Payer: Self-pay

## 2019-07-27 DIAGNOSIS — N183 Chronic kidney disease, stage 3 unspecified: Secondary | ICD-10-CM | POA: Diagnosis not present

## 2019-07-27 DIAGNOSIS — E039 Hypothyroidism, unspecified: Secondary | ICD-10-CM | POA: Insufficient documentation

## 2019-07-27 DIAGNOSIS — D631 Anemia in chronic kidney disease: Secondary | ICD-10-CM | POA: Diagnosis present

## 2019-07-27 DIAGNOSIS — Z79899 Other long term (current) drug therapy: Secondary | ICD-10-CM | POA: Diagnosis not present

## 2019-07-27 DIAGNOSIS — Z7984 Long term (current) use of oral hypoglycemic drugs: Secondary | ICD-10-CM | POA: Insufficient documentation

## 2019-07-27 DIAGNOSIS — D509 Iron deficiency anemia, unspecified: Secondary | ICD-10-CM

## 2019-07-27 DIAGNOSIS — Z7951 Long term (current) use of inhaled steroids: Secondary | ICD-10-CM | POA: Insufficient documentation

## 2019-07-27 LAB — COMPREHENSIVE METABOLIC PANEL
ALT: 14 U/L (ref 0–44)
AST: 17 U/L (ref 15–41)
Albumin: 3.5 g/dL (ref 3.5–5.0)
Alkaline Phosphatase: 75 U/L (ref 38–126)
Anion gap: 9 (ref 5–15)
BUN: 45 mg/dL — ABNORMAL HIGH (ref 8–23)
CO2: 23 mmol/L (ref 22–32)
Calcium: 8.6 mg/dL — ABNORMAL LOW (ref 8.9–10.3)
Chloride: 110 mmol/L (ref 98–111)
Creatinine, Ser: 2.45 mg/dL — ABNORMAL HIGH (ref 0.44–1.00)
GFR calc Af Amer: 19 mL/min — ABNORMAL LOW (ref >60)
GFR calc non Af Amer: 17 mL/min — ABNORMAL LOW (ref >60)
Glucose, Bld: 135 mg/dL — ABNORMAL HIGH (ref 70–99)
Potassium: 5.2 mmol/L — ABNORMAL HIGH (ref 3.5–5.1)
Sodium: 142 mmol/L (ref 135–145)
Total Bilirubin: 0.5 mg/dL (ref 0.3–1.2)
Total Protein: 6 g/dL — ABNORMAL LOW (ref 6.5–8.1)

## 2019-07-27 LAB — FERRITIN: Ferritin: 611 ng/mL — ABNORMAL HIGH (ref 11–307)

## 2019-07-27 LAB — IRON AND TIBC
Iron: 47 ug/dL (ref 28–170)
Saturation Ratios: 25 % (ref 10.4–31.8)
TIBC: 186 ug/dL — ABNORMAL LOW (ref 250–450)
UIBC: 139 ug/dL

## 2019-07-27 LAB — DAT, POLYSPECIFIC AHG (ARMC ONLY): Polyspecific AHG test: NEGATIVE

## 2019-07-27 LAB — CBC
HCT: 25.6 % — ABNORMAL LOW (ref 36.0–46.0)
Hemoglobin: 8.2 g/dL — ABNORMAL LOW (ref 12.0–15.0)
MCH: 31.5 pg (ref 26.0–34.0)
MCHC: 32 g/dL (ref 30.0–36.0)
MCV: 98.5 fL (ref 80.0–100.0)
Platelets: 177 10*3/uL (ref 150–400)
RBC: 2.6 MIL/uL — ABNORMAL LOW (ref 3.87–5.11)
RDW: 13.3 % (ref 11.5–15.5)
WBC: 8.1 10*3/uL (ref 4.0–10.5)
nRBC: 0 % (ref 0.0–0.2)

## 2019-07-27 LAB — LACTATE DEHYDROGENASE: LDH: 105 U/L (ref 98–192)

## 2019-07-27 LAB — RETICULOCYTES
Immature Retic Fract: 12.2 % (ref 2.3–15.9)
RBC.: 2.59 MIL/uL — ABNORMAL LOW (ref 3.87–5.11)
Retic Count, Absolute: 46.1 10*3/uL (ref 19.0–186.0)
Retic Ct Pct: 1.8 % (ref 0.4–3.1)

## 2019-07-27 LAB — VITAMIN B12: Vitamin B-12: 963 pg/mL — ABNORMAL HIGH (ref 180–914)

## 2019-07-27 LAB — FOLATE: Folate: 9.2 ng/mL (ref >5.9)

## 2019-07-27 NOTE — Progress Notes (Signed)
Patient called for prescreening. She denied pain or any other concerns.

## 2019-07-28 ENCOUNTER — Ambulatory Visit
Admission: RE | Admit: 2019-07-28 | Discharge: 2019-07-28 | Disposition: A | Discharge status: Home / Self Care | Payer: Medicare Other | Source: Ambulatory Visit | Attending: Oncology | Admitting: Oncology

## 2019-07-28 ENCOUNTER — Inpatient Hospital Stay: Payer: Medicare Other

## 2019-07-28 ENCOUNTER — Inpatient Hospital Stay (HOSPITAL_BASED_OUTPATIENT_CLINIC_OR_DEPARTMENT_OTHER): Payer: Medicare Other | Admitting: Oncology

## 2019-07-28 ENCOUNTER — Telehealth: Payer: Self-pay | Admitting: *Deleted

## 2019-07-28 VITALS — BP 156/36 | HR 70 | Temp 97.5°F | Wt 175.3 lb

## 2019-07-28 DIAGNOSIS — D500 Iron deficiency anemia secondary to blood loss (chronic): Secondary | ICD-10-CM

## 2019-07-28 DIAGNOSIS — R0602 Shortness of breath: Secondary | ICD-10-CM | POA: Diagnosis not present

## 2019-07-28 DIAGNOSIS — D509 Iron deficiency anemia, unspecified: Secondary | ICD-10-CM

## 2019-07-28 DIAGNOSIS — N183 Chronic kidney disease, stage 3 unspecified: Secondary | ICD-10-CM | POA: Diagnosis not present

## 2019-07-28 DIAGNOSIS — I701 Atherosclerosis of renal artery: Secondary | ICD-10-CM | POA: Diagnosis not present

## 2019-07-28 LAB — ERYTHROPOIETIN: Erythropoietin: 14 m[IU]/mL (ref 2.6–18.5)

## 2019-07-28 LAB — HAPTOGLOBIN: Haptoglobin: 190 mg/dL (ref 41–333)

## 2019-07-28 MED ORDER — HEPARIN SOD (PORK) LOCK FLUSH 100 UNIT/ML IV SOLN
500.0000 [IU] | Freq: Once | INTRAVENOUS | Status: DC | PRN
  Filled 2019-07-28: qty 5

## 2019-07-28 MED ORDER — SODIUM CHLORIDE 0.9 % IV SOLN: 510.0000 mg | Freq: Once | INTRAVENOUS | Status: DC

## 2019-07-28 MED ORDER — ALTEPLASE 2 MG IJ SOLR
2.0000 mg | Freq: Once | INTRAMUSCULAR | Status: DC | PRN
  Filled 2019-07-28: qty 2

## 2019-07-28 MED ORDER — SODIUM CHLORIDE 0.9% FLUSH
3.0000 mL | Freq: Once | INTRAVENOUS | Status: DC | PRN
  Filled 2019-07-28: qty 3

## 2019-07-28 MED ORDER — HEPARIN SOD (PORK) LOCK FLUSH 100 UNIT/ML IV SOLN
250.0000 [IU] | Freq: Once | INTRAVENOUS | Status: DC | PRN
  Filled 2019-07-28: qty 5

## 2019-07-28 MED ORDER — EPOETIN ALFA-EPBX 40000 UNIT/ML IJ SOLN
40000.0000 [IU] | Freq: Once | INTRAMUSCULAR | Status: DC
  Administered 2019-07-28: 40000 [IU] via SUBCUTANEOUS

## 2019-07-28 MED ORDER — SODIUM CHLORIDE 0.9% FLUSH
10.0000 mL | Freq: Once | INTRAVENOUS | Status: DC | PRN
  Filled 2019-07-28: qty 10

## 2019-07-28 MED ORDER — EPOETIN ALFA-EPBX 40000 UNIT/ML IJ SOLN
40000.0000 [IU] | Freq: Once | INTRAMUSCULAR | Status: DC
  Filled 2019-07-28: qty 1

## 2019-07-28 NOTE — Telephone Encounter (Signed)
Patient did not come back to the cancer center nursing after her xray as previously instructed. Contacted patient and also left a vm for her daughter to return my phone call.  RN Contacted patient to review her plan of care/xray results and apts with Dr. Candiss Norse (Apt with nephrology on 08/03/2019 at 1040 am.).  Patient states that she is still coughing up green mucus. She is requesting "something for chest congestion." CXR results reviewed with patient. I spoke with Dr. Grayland Ormond. MD would only treat if patient has pneumonia. Given that cxr is normal. Will defer management of symptoms and cough to pcp. Patient aware of plan of care. She will contact [name] at [facility] for an apt.

## 2019-08-04 ENCOUNTER — Other Ambulatory Visit: Payer: Self-pay | Admitting: Student

## 2019-08-04 DIAGNOSIS — N184 Chronic kidney disease, stage 4 (severe): Secondary | ICD-10-CM

## 2019-08-05 ENCOUNTER — Other Ambulatory Visit: Payer: Medicare Other

## 2019-08-05 ENCOUNTER — Other Ambulatory Visit: Payer: Self-pay

## 2019-08-05 NOTE — Progress Notes (Unsigned)
PATIENT NAME: Amy [name] DOB: Dec 08, 1927 MRN: [id number]  PRIMARY CARE PROVIDER: [name], MD  RESPONSIBLE PARTY:  Acct ID - Guarantor Home Phone Work Phone Relationship Acct Type  [id number] Amy [name],* [phone]  Self P/F     [address], [place], [place] [address]    PLAN OF CARE and INTERVENTIONS:               1.  GOALS OF CARE/ ADVANCE CARE PLANNING:  Remain at home and be comfortable.               2.  PATIENT/CAREGIVER EDUCATION:  Education on fall precautions, education on s/s of infection, education on hospice services, support                4. PERSONAL EMERGENCY PLAN:  Patient resides in home behind her granddaughter.  Patient has [employer] in place.               5.  DISEASE STATUS: SW [place] and RN made scheduled palliative care home visit. Patient coming out of bedroom when palliative care team arrived. Patient reports she has not been doing well and has not felt well for 2 weeks. Patient saw nephrologist Dr Candiss Norse yesterday. Patients GFR is down to 17 and creatinine is 2.41. Patient reports she just finished antibiotics yesterday for increased congestion. Patient is very weak and is wheezing. Patient states primary care provider Dr  Edwina Barth instructed her to do nebulizer treatments 4 times daily. Patient reports she has no appetite. Patients weight April 29th was 178 lb and current weight is 174.6 lbs. Patient appears not to feel well and reports she has had some soreness in her left leg. Patient states she feels like she pulled a muscle. Patient also complained of some discomfort in her right shoulder. Patient reports she is scheduled to go to [facility] with family and will be leaving on Monday. Patient states she does not feel like going. Patient blood pressure 140/ 20 or less as nurse could still here pulsating on stethoscope as blood pressure bottomed out. Patients metformin was discontinued a month ago as her hemoglobin A1c had improved. Patient  reports she attempted to eat a boiled egg for breakfast but could not stomach eating egg.  Patient states she has not been sleeping well at night. Patient is no longer able to drive and family takes patient to her MD appointments. Patient did not receive iron at the [facility] this week due to congestion and breathlessness. Patient now requires assistive device when she goes out. Palliative care team  talked with patient about hospice referral and patient wants to talk with family first. Patient was in agreement with RN contacting dr. Keturah Barre office to see if he would be in agreement with hospice referral. Support provided to patient. Education to patient on fall precautions. SW  discussed patient needing a ramp. Patient states family has talked about a ramp in the past. Patient encouraged to contact palliative care with questions or concerns.     HISTORY OF PRESENT ILLNESS:  Patient is a 86 year old female who resides in her home.  Patient is followed by palliative care.  Patient is declining and kidney function on worsening.  RN placed call to Dr Candiss Norse office to ask about hospice referral for patient.     CODE STATUS: DNR  ADVANCED DIRECTIVES: Y MOST FORM: Yes PPS: 40%   PHYSICAL EXAM:   VITALS: Today's Vitals   08/05/19 0940  BP: (!) 140/20  Pulse: 75  Resp: 20  Temp: 97.7 F (36.5 C)  TempSrc: Temporal  SpO2: 98%  Weight: 178 lb (80.7 kg)  PainSc: 2   PainLoc: Shoulder    LUNGS: expiratory wheezes bilaterally CARDIAC: Cor RRR  EXTREMITIES: trace edema LLE, lasix increased to be tabs daily this week SKIN: no visible open areas of skin breakdown  NEURO: positive for gait problems and weakness       [name], RN

## 2019-08-06 ENCOUNTER — Other Ambulatory Visit: Payer: Self-pay

## 2019-08-06 ENCOUNTER — Observation Stay
Admission: EM | Admit: 2019-08-06 | Discharge: 2019-08-07 | Disposition: A | Discharge status: Home / Self Care | Payer: Medicare Other | Attending: Internal Medicine | Admitting: Internal Medicine

## 2019-08-06 ENCOUNTER — Emergency Department: Payer: Medicare Other

## 2019-08-06 ENCOUNTER — Observation Stay: Payer: Medicare Other

## 2019-08-06 ENCOUNTER — Encounter: Payer: Self-pay | Admitting: Emergency Medicine

## 2019-08-06 DIAGNOSIS — N179 Acute kidney failure, unspecified: Secondary | ICD-10-CM | POA: Insufficient documentation

## 2019-08-06 DIAGNOSIS — I4510 Unspecified right bundle-branch block: Secondary | ICD-10-CM | POA: Diagnosis not present

## 2019-08-06 DIAGNOSIS — E1122 Type 2 diabetes mellitus with diabetic chronic kidney disease: Secondary | ICD-10-CM | POA: Insufficient documentation

## 2019-08-06 DIAGNOSIS — I701 Atherosclerosis of renal artery: Secondary | ICD-10-CM | POA: Insufficient documentation

## 2019-08-06 DIAGNOSIS — I130 Hypertensive heart and chronic kidney disease with heart failure and stage 1 through stage 4 chronic kidney disease, or unspecified chronic kidney disease: Secondary | ICD-10-CM | POA: Insufficient documentation

## 2019-08-06 DIAGNOSIS — M170 Bilateral primary osteoarthritis of knee: Secondary | ICD-10-CM | POA: Diagnosis not present

## 2019-08-06 DIAGNOSIS — Z7984 Long term (current) use of oral hypoglycemic drugs: Secondary | ICD-10-CM | POA: Insufficient documentation

## 2019-08-06 DIAGNOSIS — E039 Hypothyroidism, unspecified: Secondary | ICD-10-CM | POA: Insufficient documentation

## 2019-08-06 DIAGNOSIS — Z882 Allergy status to sulfonamides status: Secondary | ICD-10-CM | POA: Insufficient documentation

## 2019-08-06 DIAGNOSIS — Z79899 Other long term (current) drug therapy: Secondary | ICD-10-CM | POA: Insufficient documentation

## 2019-08-06 DIAGNOSIS — N184 Chronic kidney disease, stage 4 (severe): Secondary | ICD-10-CM | POA: Diagnosis not present

## 2019-08-06 DIAGNOSIS — G4700 Insomnia, unspecified: Secondary | ICD-10-CM | POA: Insufficient documentation

## 2019-08-06 DIAGNOSIS — J449 Chronic obstructive pulmonary disease, unspecified: Secondary | ICD-10-CM | POA: Diagnosis not present

## 2019-08-06 DIAGNOSIS — I10 Essential (primary) hypertension: Secondary | ICD-10-CM | POA: Diagnosis not present

## 2019-08-06 DIAGNOSIS — R609 Edema, unspecified: Secondary | ICD-10-CM | POA: Diagnosis present

## 2019-08-06 DIAGNOSIS — Z96653 Presence of artificial knee joint, bilateral: Secondary | ICD-10-CM | POA: Insufficient documentation

## 2019-08-06 DIAGNOSIS — Z886 Allergy status to analgesic agent status: Secondary | ICD-10-CM | POA: Diagnosis not present

## 2019-08-06 DIAGNOSIS — Z885 Allergy status to narcotic agent status: Secondary | ICD-10-CM | POA: Insufficient documentation

## 2019-08-06 DIAGNOSIS — R079 Chest pain, unspecified: Secondary | ICD-10-CM | POA: Diagnosis present

## 2019-08-06 DIAGNOSIS — Z888 Allergy status to other drugs, medicaments and biological substances status: Secondary | ICD-10-CM | POA: Insufficient documentation

## 2019-08-06 DIAGNOSIS — Z955 Presence of coronary angioplasty implant and graft: Secondary | ICD-10-CM | POA: Insufficient documentation

## 2019-08-06 DIAGNOSIS — M25511 Pain in right shoulder: Secondary | ICD-10-CM | POA: Insufficient documentation

## 2019-08-06 DIAGNOSIS — Z7902 Long term (current) use of antithrombotics/antiplatelets: Secondary | ICD-10-CM | POA: Insufficient documentation

## 2019-08-06 DIAGNOSIS — I5032 Chronic diastolic (congestive) heart failure: Secondary | ICD-10-CM | POA: Diagnosis not present

## 2019-08-06 DIAGNOSIS — I4891 Unspecified atrial fibrillation: Secondary | ICD-10-CM | POA: Insufficient documentation

## 2019-08-06 DIAGNOSIS — I2510 Atherosclerotic heart disease of native coronary artery without angina pectoris: Secondary | ICD-10-CM | POA: Insufficient documentation

## 2019-08-06 DIAGNOSIS — R531 Weakness: Secondary | ICD-10-CM | POA: Insufficient documentation

## 2019-08-06 DIAGNOSIS — Z8543 Personal history of malignant neoplasm of ovary: Secondary | ICD-10-CM | POA: Insufficient documentation

## 2019-08-06 DIAGNOSIS — D631 Anemia in chronic kidney disease: Secondary | ICD-10-CM | POA: Insufficient documentation

## 2019-08-06 DIAGNOSIS — Z20822 Contact with and (suspected) exposure to covid-19: Secondary | ICD-10-CM | POA: Diagnosis not present

## 2019-08-06 DIAGNOSIS — Z7951 Long term (current) use of inhaled steroids: Secondary | ICD-10-CM | POA: Insufficient documentation

## 2019-08-06 DIAGNOSIS — R630 Anorexia: Secondary | ICD-10-CM | POA: Insufficient documentation

## 2019-08-06 DIAGNOSIS — Z79890 Hormone replacement therapy (postmenopausal): Secondary | ICD-10-CM | POA: Insufficient documentation

## 2019-08-06 LAB — TSH: TSH: 1.119 u[IU]/mL (ref 0.350–4.500)

## 2019-08-06 LAB — CBC
HCT: 30 % — ABNORMAL LOW (ref 36.0–46.0)
Hemoglobin: 10 g/dL — ABNORMAL LOW (ref 12.0–15.0)
MCH: 32.1 pg (ref 26.0–34.0)
MCHC: 33.3 g/dL (ref 30.0–36.0)
MCV: 96.2 fL (ref 80.0–100.0)
Platelets: 203 10*3/uL (ref 150–400)
RBC: 3.12 MIL/uL — ABNORMAL LOW (ref 3.87–5.11)
RDW: 14.9 % (ref 11.5–15.5)
WBC: 10.5 10*3/uL (ref 4.0–10.5)
nRBC: 0 % (ref 0.0–0.2)

## 2019-08-06 LAB — BASIC METABOLIC PANEL
Anion gap: 15 (ref 5–15)
BUN: 52 mg/dL — ABNORMAL HIGH (ref 8–23)
CO2: 22 mmol/L (ref 22–32)
Calcium: 8.4 mg/dL — ABNORMAL LOW (ref 8.9–10.3)
Chloride: 101 mmol/L (ref 98–111)
Creatinine, Ser: 2.79 mg/dL — ABNORMAL HIGH (ref 0.44–1.00)
GFR calc Af Amer: 17 mL/min — ABNORMAL LOW (ref >60)
GFR calc non Af Amer: 14 mL/min — ABNORMAL LOW (ref >60)
Glucose, Bld: 119 mg/dL — ABNORMAL HIGH (ref 70–99)
Potassium: 3.8 mmol/L (ref 3.5–5.1)
Sodium: 138 mmol/L (ref 135–145)

## 2019-08-06 LAB — HEPATIC FUNCTION PANEL
ALT: 13 U/L (ref 0–44)
AST: 17 U/L (ref 15–41)
Albumin: 3.2 g/dL — ABNORMAL LOW (ref 3.5–5.0)
Alkaline Phosphatase: 66 U/L (ref 38–126)
Bilirubin, Direct: 0.2 mg/dL (ref 0.0–0.2)
Indirect Bilirubin: 0.7 mg/dL (ref 0.3–0.9)
Total Bilirubin: 0.9 mg/dL (ref 0.3–1.2)
Total Protein: 5.6 g/dL — ABNORMAL LOW (ref 6.5–8.1)

## 2019-08-06 LAB — MAGNESIUM: Magnesium: 1.8 mg/dL (ref 1.7–2.4)

## 2019-08-06 LAB — LIPID PANEL
Cholesterol: 107 mg/dL (ref 0–200)
HDL: 33 mg/dL — ABNORMAL LOW (ref >40)
LDL Cholesterol: 63 mg/dL (ref 0–99)
Total CHOL/HDL Ratio: 3.2 RATIO
Triglycerides: 53 mg/dL (ref <150)
VLDL: 11 mg/dL (ref 0–40)

## 2019-08-06 LAB — PROTIME-INR
INR: 1.2 (ref 0.8–1.2)
Prothrombin Time: 14.3 seconds (ref 11.4–15.2)

## 2019-08-06 LAB — LIPASE, BLOOD: Lipase: 21 U/L (ref 11–51)

## 2019-08-06 LAB — TROPONIN I (HIGH SENSITIVITY)
Troponin I (High Sensitivity): 27 ng/L — ABNORMAL HIGH (ref <18)
Troponin I (High Sensitivity): 26 ng/L — ABNORMAL HIGH (ref <18)
Troponin I (High Sensitivity): 28 ng/L — ABNORMAL HIGH (ref <18)

## 2019-08-06 LAB — APTT: aPTT: 30 seconds (ref 24–36)

## 2019-08-06 LAB — SARS CORONAVIRUS 2 BY RT PCR (HOSPITAL ORDER, PERFORMED IN ~~LOC~~ HOSPITAL LAB): SARS Coronavirus 2: NEGATIVE

## 2019-08-06 MED ORDER — SIMVASTATIN 20 MG PO TABS
40.0000 mg | ORAL_TABLET | Freq: Every day | ORAL | Status: DC
  Administered 2019-08-06: 40 mg via ORAL
  Filled 2019-08-06: qty 4

## 2019-08-06 MED ORDER — FENTANYL CITRATE (PF) 100 MCG/2ML IJ SOLN
50.0000 ug | Freq: Once | INTRAMUSCULAR | Status: AC
  Administered 2019-08-06: 50 ug via INTRAVENOUS
  Filled 2019-08-06: qty 2

## 2019-08-06 MED ORDER — POTASSIUM CHLORIDE CRYS ER 20 MEQ PO TBCR
40.0000 meq | EXTENDED_RELEASE_TABLET | Freq: Once | ORAL | Status: AC
  Administered 2019-08-06: 40 meq via ORAL
  Filled 2019-08-06: qty 2

## 2019-08-06 MED ORDER — HEPARIN BOLUS VIA INFUSION
3000.0000 [IU] | Freq: Once | INTRAVENOUS | Status: AC
  Administered 2019-08-06: 3000 [IU] via INTRAVENOUS
  Filled 2019-08-06: qty 3000

## 2019-08-06 MED ORDER — ONDANSETRON HCL 4 MG/2ML IJ SOLN
4.0000 mg | Freq: Once | INTRAMUSCULAR | Status: AC
  Administered 2019-08-06: 4 mg via INTRAVENOUS
  Filled 2019-08-06: qty 2

## 2019-08-06 MED ORDER — ASPIRIN EC 81 MG PO TBEC
81.0000 mg | DELAYED_RELEASE_TABLET | Freq: Every day | ORAL | Status: DC
  Administered 2019-08-06: 81 mg via ORAL
  Filled 2019-08-06: qty 1

## 2019-08-06 MED ORDER — ALPRAZOLAM 0.25 MG PO TABS
0.2500 mg | ORAL_TABLET | Freq: Two times a day (BID) | ORAL | Status: DC | PRN
  Administered 2019-08-06: 0.25 mg via ORAL
  Filled 2019-08-06: qty 1

## 2019-08-06 MED ORDER — HYDROCODONE-ACETAMINOPHEN 7.5-325 MG PO TABS: 1.0000 | ORAL_TABLET | Freq: Four times a day (QID) | ORAL | Status: DC | PRN

## 2019-08-06 MED ORDER — IPRATROPIUM-ALBUTEROL 0.5-2.5 (3) MG/3ML IN SOLN: 3.0000 mL | RESPIRATORY_TRACT | Status: DC | PRN

## 2019-08-06 MED ORDER — ALUM & MAG HYDROXIDE-SIMETH 200-200-20 MG/5ML PO SUSP: 30.0000 mL | Freq: Once | ORAL | Status: DC

## 2019-08-06 MED ORDER — ACETAMINOPHEN 325 MG PO TABS
650.0000 mg | ORAL_TABLET | ORAL | Status: DC | PRN
  Administered 2019-08-06: 650 mg via ORAL
  Filled 2019-08-06: qty 2

## 2019-08-06 MED ORDER — HYDRALAZINE HCL 50 MG PO TABS: 100.0000 mg | ORAL_TABLET | Freq: Three times a day (TID) | ORAL | Status: DC

## 2019-08-06 MED ORDER — CLOPIDOGREL BISULFATE 75 MG PO TABS: 75.0000 mg | ORAL_TABLET | Freq: Every day | ORAL | Status: DC

## 2019-08-06 MED ORDER — HYDROCODONE-ACETAMINOPHEN 7.5-325 MG PO TABS
1.0000 | ORAL_TABLET | Freq: Four times a day (QID) | ORAL | Status: DC | PRN
  Administered 2019-08-06 – 2019-08-07 (×2): 1 via ORAL
  Filled 2019-08-06 (×2): qty 1

## 2019-08-06 MED ORDER — SODIUM CHLORIDE 0.9% FLUSH: 3.0000 mL | Freq: Once | INTRAVENOUS | Status: DC

## 2019-08-06 MED ORDER — DOXAZOSIN MESYLATE 8 MG PO TABS
8.0000 mg | ORAL_TABLET | Freq: Every day | ORAL | Status: DC
  Filled 2019-08-06: qty 1

## 2019-08-06 MED ORDER — ALBUTEROL SULFATE (2.5 MG/3ML) 0.083% IN NEBU: 3.0000 mL | INHALATION_SOLUTION | Freq: Four times a day (QID) | RESPIRATORY_TRACT | Status: DC | PRN

## 2019-08-06 MED ORDER — CLONIDINE HCL 0.2 MG/24HR TD PTWK: 0.2000 mg | MEDICATED_PATCH | TRANSDERMAL | Status: DC

## 2019-08-06 MED ORDER — MOMETASONE FURO-FORMOTEROL FUM 200-5 MCG/ACT IN AERO
2.0000 | INHALATION_SPRAY | Freq: Two times a day (BID) | RESPIRATORY_TRACT | Status: DC
  Administered 2019-08-07 (×2): 2 via RESPIRATORY_TRACT
  Filled 2019-08-06: qty 8.8

## 2019-08-06 MED ORDER — PANTOPRAZOLE SODIUM 20 MG PO TBEC
20.0000 mg | DELAYED_RELEASE_TABLET | Freq: Every day | ORAL | Status: DC
  Filled 2019-08-06: qty 1

## 2019-08-06 MED ORDER — ONDANSETRON HCL 4 MG/2ML IJ SOLN: 4.0000 mg | Freq: Four times a day (QID) | INTRAMUSCULAR | Status: DC | PRN

## 2019-08-06 MED ORDER — OXYBUTYNIN CHLORIDE ER 5 MG PO TB24
5.0000 mg | ORAL_TABLET | Freq: Every day | ORAL | Status: DC
  Filled 2019-08-06: qty 1

## 2019-08-06 MED ORDER — NITROGLYCERIN 0.4 MG SL SUBL: 0.4000 mg | SUBLINGUAL_TABLET | SUBLINGUAL | Status: DC | PRN

## 2019-08-06 MED ORDER — LIDOCAINE VISCOUS HCL 2 % MT SOLN
15.0000 mL | Freq: Once | OROMUCOSAL | Status: AC
  Administered 2019-08-06: 15 mL via ORAL
  Filled 2019-08-06: qty 15

## 2019-08-06 MED ORDER — NITROGLYCERIN 2 % TD OINT
0.5000 [in_us] | TOPICAL_OINTMENT | Freq: Four times a day (QID) | TRANSDERMAL | Status: DC
  Administered 2019-08-06 – 2019-08-07 (×3): 0.5 [in_us] via TOPICAL
  Filled 2019-08-06 (×3): qty 1

## 2019-08-06 MED ORDER — HEPARIN (PORCINE) 25000 UT/250ML-% IV SOLN
800.0000 [IU]/h | INTRAVENOUS | Status: DC
  Administered 2019-08-06: 800 [IU]/h via INTRAVENOUS
  Filled 2019-08-06: qty 250

## 2019-08-06 MED ORDER — ALUM & MAG HYDROXIDE-SIMETH 200-200-20 MG/5ML PO SUSP
30.0000 mL | Freq: Once | ORAL | Status: AC
  Administered 2019-08-06: 30 mL via ORAL
  Filled 2019-08-06: qty 30

## 2019-08-06 NOTE — ED Notes (Signed)
Pt moved from bed to chair with one assist.

## 2019-08-06 NOTE — ED Triage Notes (Signed)
Pt presents to ED via POV with c/o CP that started yesterday, starts on R side and radiates to back. Pt states finished abx for bronchitis yesterday and CP started. Pt with noted cough in triage. VSS, however pt is tachypneic on arrival. Pt also c/o nausea at this time.

## 2019-08-06 NOTE — ED Notes (Signed)
Pt walked to bathroom with stand by assist and walker. Pt states she feels fatigued after walking. Pt back to bed.

## 2019-08-06 NOTE — ED Notes (Signed)
Message sent to pharmacy to verify pt's meds.

## 2019-08-06 NOTE — ED Notes (Signed)
Pt continues in recliner, pt states it is more comfortable than bed. Pt is using call bell apppropriately

## 2019-08-06 NOTE — ED Notes (Signed)
Pt helped to toilet, using walker, stand by assist

## 2019-08-06 NOTE — ED Provider Notes (Signed)
[facility] Emergency Department Provider Note  Time seen: 12:29 PM  I have reviewed the triage vital signs and the nursing notes.   HISTORY  Chief Complaint Chest Pain   HPI Amy [name] is a 86 y.o. female with a past medical history of CAD, CKD, diabetes and hypertension, presents to the emergency department for chest pain.  According to the patient for the past 3 days she has been experiencing chest pain radiating to her back although acutely worse since this morning.  Patient denies shortness of breath.  Denies significant pleuritic pain but states it is painful regardless of breathing.  Describes the pain currently as a 10/10 central chest pain.  Denies any vomiting or diaphoresis.  Does state some shortness of breath.  Patient states mild lower extremity edema left greater than right but this is chronic times years.  Denies any leg pain.  Past Medical History:  Diagnosis Date  . Bladder incontinence   . CAD (coronary artery disease)    a. 05/2009 Cath: LAD 90p (Xience 2.75 x 12 mm DES), 8m, D1 40, LCx 40p/m, RCA 30/40/30p/m, RCA 30/25d;  b.  12/2011 Lexiscan MV: no ischemia, breast attenuation artifact, normal EF-->Low risk; c. 10/2016 MV: fixed apical defect, most likely apical thinning and attenuation, No ischemia, EF 60%.  . Cancer ([place])    ovarian  . Carotid arterial disease w/ R Carotid Bruit (HCC)    a. 08/2016 Carotid U/S: 40-59% bilat ICA stenosis - f/u 1 yr.  . Chronic diastolic (congestive) heart failure ([place])    a. Echo 09/2015: EF 55-60% w/ Grade 1 DD, sev Ca2+ MV annulus, mildly dil LA; b. 09/2016 Echo: EF 65-70%, Gr2 DD, mildly dil LA/RA, nl RV fxn.  . Chronic Dyspnea on exertion   . CKD (chronic kidney disease) stage 3, GFR 30-59 ml/min   . Degenerative arthritis of knee    bilateral knees  . Diabetes mellitus    Type II  . GIB (gastrointestinal bleeding)    a. 08/2016 Admission w/ presyncope/anemia/melena-->req Transfusion-->endo ok,  colonoscopy w/ polyps but no source of bleeding (most likely diverticular).  . Hiatal hernia   . Hypertension   . Iron deficiency   . Menopausal symptoms   . Morbid obesity ([place])   . Renal insufficiency   . Thyroid disease    hypothyroidism    Patient Active Problem List   Diagnosis Date Noted  . Renovascular hypertension 03/08/2019  . Renal artery stenosis ([place]) 01/28/2019  . Multiple renal cysts 12/29/2018  . Renal stone 12/29/2018  . Chronic kidney disease, stage IV (severe) ([place]) 11/23/2018  . Diabetes ([place]) 06/29/2017  . CAP (community acquired pneumonia) 05/16/2017  . GI bleed 09/21/2016  . Vitamin D deficiency 11/04/2015  . Multiple lung nodules   . Bronchitis with obstruction ([place])   . New onset atrial fibrillation ([place]) 10/04/2015  . Anemia 10/04/2015  . Palliative care encounter 06/05/2015  . Morbid obesity ([place]) 12/05/2014  . Angina pectoris associated with type 2 diabetes mellitus ([place]) 12/05/2014  . Type 2 diabetes mellitus with hyperglycemia ([place]) 08/22/2014  . Chronic diastolic CHF (congestive heart failure) ([place]) 09/06/2013  . Preop cardiovascular exam 06/03/2012  . Iron deficiency anemia 09/08/2011  . Hyperlipidemia 07/18/2009  . CAD, NATIVE VESSEL 07/18/2009  . HYPOTHYROIDISM-IATROGENIC 07/12/2009  . Essential hypertension 07/12/2009    Past Surgical History:  Procedure Laterality Date  . COLONOSCOPY  2015  . COLONOSCOPY WITH PROPOFOL N/A 09/25/2016   Procedure: COLONOSCOPY WITH PROPOFOL;  Surgeon:  [name], MD;  Location: [facility] ENDOSCOPY;  Service: Gastroenterology;  Laterality: N/A;  . COLONOSCOPY WITH PROPOFOL N/A 09/26/2016   Procedure: COLONOSCOPY WITH PROPOFOL;  Surgeon: [name], MD;  Location: [facility] ENDOSCOPY;  Service: Gastroenterology;  Laterality: N/A;  . ESOPHAGOGASTRODUODENOSCOPY (EGD) WITH PROPOFOL N/A 09/25/2016   Procedure: ESOPHAGOGASTRODUODENOSCOPY (EGD) WITH PROPOFOL;  Surgeon: [name], MD;  Location: [facility] ENDOSCOPY;  Service:  Gastroenterology;  Laterality: N/A;  . RENAL ANGIOGRAPHY N/A 02/07/2019   Procedure: RENAL ANGIOGRAPHY;  Surgeon: [name], MD;  Location: [facility] CV LAB;  Service: Cardiovascular;  Laterality: N/A;  . REPLACEMENT TOTAL KNEE BILATERAL    . TOTAL VAGINAL HYSTERECTOMY     ovarian mass, not cancerous  . UPPER GI ENDOSCOPY  2015    Prior to Admission medications   Medication Sig Start Date End Date Taking? Authorizing Provider  albuterol (PROVENTIL HFA;VENTOLIN HFA) 108 (90 Base) MCG/ACT inhaler Inhale 2 puffs into the lungs every 6 (six) hours as needed for wheezing or shortness of breath.    [provider]  budesonide (PULMICORT) 0.5 MG/2ML nebulizer solution Inhale into the lungs. 03/16/19 03/15/20  [provider]  cloNIDine (CATAPRES - DOSED IN MG/24 HR) 0.2 mg/24hr patch Place 1 patch (0.2 mg total) onto the skin once a week. 06/29/17   Alisa Graff, FNP  clopidogrel (PLAVIX) 75 MG tablet Take 1 tablet (75 mg total) by mouth daily. 09/27/16   Dustin Flock, MD  docusate sodium (COLACE) 100 MG capsule Take 100 mg by mouth daily as needed for mild constipation. Takes daily    [provider]  doxazosin (CARDURA) 8 MG tablet Take 1 tablet (8 mg total) by mouth daily. 03/15/19   Minna Merritts, MD  erythromycin ophthalmic ointment  06/14/18   [provider]  furosemide (LASIX) 20 MG tablet Take 1 tablet (20 mg) by mouth once daily as needed for weight gain/ shortness of breath/ swelling 04/05/18   Minna Merritts, MD  hydrALAZINE (APRESOLINE) 100 MG tablet Take 100 mg by mouth 3 (three) times daily. 05/21/19   [provider]  ipratropium-albuterol (DUONEB) 0.5-2.5 (3) MG/3ML SOLN Take 3 mLs by nebulization every 4 (four) hours as needed (shortness of breath/wheezing.). 10/11/15   Henreitta Leber, MD  levothyroxine (SYNTHROID) 88 MCG tablet  06/20/19   [provider]  levothyroxine (SYNTHROID, LEVOTHROID) 125 MCG tablet Take 125  mcg by mouth daily.    [provider]  lisinopril (ZESTRIL) 10 MG tablet Take 1 tablet (10 mg total) by mouth daily. 03/15/19   Minna Merritts, MD  metFORMIN (GLUCOPHAGE) 1000 MG tablet Take 1,000 mg by mouth 2 (two) times daily with a meal.    [provider]  montelukast (SINGULAIR) 10 MG tablet TAKE 1 TABLET BY MOUTH EVERY DAY AT NIGHT 03/24/19   [provider]  oxybutynin (DITROPAN-XL) 5 MG 24 hr tablet Take 5 mg by mouth daily.     [provider]  pantoprazole (PROTONIX) 20 MG tablet Take 20 mg by mouth daily.    [provider]  simvastatin (ZOCOR) 40 MG tablet Take 1 tablet (40 mg total) by mouth at bedtime. 03/22/18   Minna Merritts, MD  SYMBICORT 160-4.5 MCG/ACT inhaler Inhale 2 puffs into the lungs 2 (two) times daily.  11/15/14   [provider]    Allergies  Allergen Reactions  . Losartan Other (See Comments)    Hyperkalemia  . Morphine And Related Shortness Of Breath  Rash, difficulty breathing, nausea.   . Atenolol Other (See Comments)    Other reaction(s): Other (See Comments) Decreased heart rate Decreased heart rate  . Codeine     Rash, difficulty breathing, nausea.  . Nsaids Other (See Comments)    Other reaction(s): Unknown  . Rofecoxib     Other reaction(s): Unknown  . Sucralfate Other (See Comments)    Throat tightness  . Sulfa Antibiotics     Other reaction(s): Unknown    Family History  Problem Relation Age of Onset  . Breast cancer Mother 48    Social History Social History   Tobacco Use  . Smoking status: Never Smoker  . Smokeless tobacco: Never Used  Vaping Use  . Vaping Use: Never used  Substance Use Topics  . Alcohol use: No  . Drug use: No    Review of Systems Constitutional: Negative for fever. Cardiovascular: Positive for lower chest pain radiating to her back. Respiratory: Negative for shortness of breath. Gastrointestinal: Negative for abdominal pain Musculoskeletal:  Negative for musculoskeletal complaints Neurological: Negative for headache All other ROS negative  ____________________________________________   PHYSICAL EXAM:  VITAL SIGNS: ED Triage Vitals  Enc Vitals Group     BP 08/06/19 0916 (!) 107/28     Pulse Rate 08/06/19 0916 83     Resp 08/06/19 0916 (!) 28     Temp 08/06/19 0916 98.3 F (36.8 C)     Temp Source 08/06/19 0916 Oral     SpO2 08/06/19 0916 96 %     Weight 08/06/19 0917 178 lb (80.7 kg)     Height 08/06/19 0917 5\' 2"  (1.575 m)     Head Circumference --      Peak Flow --      Pain Score 08/06/19 0917 10     Pain Loc --      Pain Edu? --      Excl. in [place]? --     Constitutional: Alert and oriented. Well appearing and in no distress. Eyes: Normal exam ENT      Head: Normocephalic and atraumatic.      Mouth/Throat: Mucous membranes are moist. Cardiovascular: Normal rate, regular rhythm.  Respiratory: Normal respiratory effort without tachypnea nor retractions. Breath sounds.  Chest wall is nontender. Gastrointestinal: Soft and nontender. No distention.  Musculoskeletal: Nontender with normal range of motion in all extremities.  Neurologic:  Normal speech and language. No gross focal neurologic deficits Skin:  Skin is warm, dry and intact.  Psychiatric: Mood and affect are normal.   ____________________________________________    EKG  EKG viewed and interpreted by myself shows a sinus rhythm at 78 bpm with a slightly widened QRS, normal axis, normal intervals, nonspecific ST changes.  ____________________________________________    RADIOLOGY  Chest x-ray shows no acute abnormality. CT scan of the chest shows no acute abnormality.  ____________________________________________   INITIAL IMPRESSION / ASSESSMENT AND PLAN / ED COURSE  Pertinent labs & imaging results that were available during my care of the patient were reviewed by me and considered in my medical decision making (see chart for details).    Patient presents to the emergency department for chest pain radiating to her back.  Pain has been ongoing for the past 3 days per patient and daughter but acutely worse today.  Patient describes the pain as sharp 10/10 pain in the lower central chest radiating to her back.  Differential would include ACS, pancreatitis, gallbladder pathology, dissection, pulmonary embolism, pneumonia, pneumothorax.  Patient's labs show  chronic kidney disease with a slight troponin elevation although not concerning the elevated given her CKD.  Ideally we would get a CT scan with contrast to evaluate for dissection or PE but however given the patient's GFR this does not appear to be feasible at this time.  We will obtain a CT without contrast to start.  We will repeat a troponin in 2 hours.  I have added on a hepatic function panel and lipase although patient has a fairly benign abdominal exam.  We will treat pain and nausea and reassess.  CT scan of chest shows no acute abnormality.  Patient states her chest pain diminished to approximately 2/10 but is now elevated once again.  Given the patient's recurrent chest pain being a high risk patient due to her age and comorbidities we will admit to the hospital service.  Amy [name] was evaluated in Emergency Department on 08/06/2019 for the symptoms described in the history of present illness. She was evaluated in the context of the global COVID-19 pandemic, which necessitated consideration that the patient might be at risk for infection with the SARS-CoV-2 virus that causes COVID-19. Institutional protocols and algorithms that pertain to the evaluation of patients at risk for COVID-19 are in a state of rapid change based on information released by regulatory bodies including the CDC and federal and state organizations. These policies and algorithms were followed during the patient's care in the ED.  ____________________________________________   FINAL CLINICAL  IMPRESSION(S) / ED DIAGNOSES  Chest pain   Harvest Dark, MD 08/06/19 1432

## 2019-08-06 NOTE — ED Notes (Signed)
IV team consult place for MD order of 18g IV in ac.

## 2019-08-06 NOTE — H&P (Addendum)
[facility] [place] NAME: Amy [name]    MR#:  [id number]  DATE OF BIRTH:  07/15/1927  DATE OF ADMISSION:  08/06/2019  PRIMARY CARE PHYSICIAN: [name], MD   REQUESTING/REFERRING PHYSICIAN: Harvest Dark, MD  Comes from Home, Lives alone.uses cane and walker for ambulation.  CHIEF COMPLAINT:   Chief Complaint  Patient presents with  . Chest Pain    HISTORY OF PRESENT ILLNESS:  Amy [name]  is a 86 y.o. female with a known history of CAD, CKD IV, diabetes and hypertension, is being admitted for evaluation of chest pain.  According to the patient for the past 3 days she has been experiencing chest pain radiating to her back although acutely worse since this morning.  Patient denies shortness of breath.  Denies significant pleuritic pain but states it is painful regardless of breathing. While in ED, she received 50 mcg of fentanyl which helped but didn't last and her pain is back when I was in room. 9/10 severity. Daughter at bedside. Patient denies ever having such bad chest pain.  Patient reports she has not been doing well and has not felt well for 2 weeks. Patient saw nephrologist Dr Candiss Norse on 6/10 - GFR is down to 17. Patient reports she just finished antibiotics on 6/10 for increased congestion. Patient is very weak and reports having no appetite. Patients weight April 29th was 178 lb and current weight is 157.5 lbs (?accuracy). She reports some soreness in her left leg - pulled a muscle? Patient also complained of some discomfort in her right shoulder. Patient reports she is scheduled to go to [facility] with family and will be leaving on Monday. Patient states she does not feel like going. Patient reports insomnia. Patient is no longer able to drive and family takes patient to her MD appointments. Patient did not receive iron at the [facility] this week due to congestion and breathlessness. Palliative care team follows at home and  discussion was Hospice brought up on 6/11. PAST MEDICAL HISTORY:   Past Medical History:  Diagnosis Date  . Bladder incontinence   . CAD (coronary artery disease)    a. 05/2009 Cath: LAD 90p (Xience 2.75 x 12 mm DES), 68m, D1 40, LCx 40p/m, RCA 30/40/30p/m, RCA 30/25d;  b.  12/2011 Lexiscan MV: no ischemia, breast attenuation artifact, normal EF-->Low risk; c. 10/2016 MV: fixed apical defect, most likely apical thinning and attenuation, No ischemia, EF 60%.  . Cancer ([place])    ovarian  . Carotid arterial disease w/ R Carotid Bruit (HCC)    a. 08/2016 Carotid U/S: 40-59% bilat ICA stenosis - f/u 1 yr.  . Chronic diastolic (congestive) heart failure ([place])    a. Echo 09/2015: EF 55-60% w/ Grade 1 DD, sev Ca2+ MV annulus, mildly dil LA; b. 09/2016 Echo: EF 65-70%, Gr2 DD, mildly dil LA/RA, nl RV fxn.  . Chronic Dyspnea on exertion   . CKD (chronic kidney disease) stage 3, GFR 30-59 ml/min   . Degenerative arthritis of knee    bilateral knees  . Diabetes mellitus    Type II  . GIB (gastrointestinal bleeding)    a. 08/2016 Admission w/ presyncope/anemia/melena-->req Transfusion-->endo ok, colonoscopy w/ polyps but no source of bleeding (most likely diverticular).  . Hiatal hernia   . Hypertension   . Iron deficiency   . Menopausal symptoms   . Morbid obesity ([place])   . Renal insufficiency   . Thyroid disease    hypothyroidism  PAST SURGICAL HISTORY:   Past Surgical History:  Procedure Laterality Date  . COLONOSCOPY  2015  . COLONOSCOPY WITH PROPOFOL N/A 09/25/2016   Procedure: COLONOSCOPY WITH PROPOFOL;  Surgeon: [name], MD;  Location: [facility] ENDOSCOPY;  Service: Gastroenterology;  Laterality: N/A;  . COLONOSCOPY WITH PROPOFOL N/A 09/26/2016   Procedure: COLONOSCOPY WITH PROPOFOL;  Surgeon: [name], MD;  Location: [facility] ENDOSCOPY;  Service: Gastroenterology;  Laterality: N/A;  . ESOPHAGOGASTRODUODENOSCOPY (EGD) WITH PROPOFOL N/A 09/25/2016   Procedure: ESOPHAGOGASTRODUODENOSCOPY (EGD)  WITH PROPOFOL;  Surgeon: [name], MD;  Location: [facility] ENDOSCOPY;  Service: Gastroenterology;  Laterality: N/A;  . RENAL ANGIOGRAPHY N/A 02/07/2019   Procedure: RENAL ANGIOGRAPHY;  Surgeon: [name], MD;  Location: [place] CV LAB;  Service: Cardiovascular;  Laterality: N/A;  . REPLACEMENT TOTAL KNEE BILATERAL    . TOTAL VAGINAL HYSTERECTOMY     ovarian mass, not cancerous  . UPPER GI ENDOSCOPY  2015   SOCIAL HISTORY:   Social History   Tobacco Use  . Smoking status: Never Smoker  . Smokeless tobacco: Never Used  Substance Use Topics  . Alcohol use: No   FAMILY HISTORY:   Family History  Problem Relation Age of Onset  . Breast cancer Mother 61   DRUG ALLERGIES:   Allergies  Allergen Reactions  . Losartan Other (See Comments)    Hyperkalemia  . Morphine And Related Shortness Of Breath    Rash, difficulty breathing, nausea.   . Atenolol Other (See Comments)    Other reaction(s): Other (See Comments) Decreased heart rate Decreased heart rate  . Codeine     Rash, difficulty breathing, nausea.  . Nsaids Other (See Comments)    Other reaction(s): Unknown  . Rofecoxib     Other reaction(s): Unknown  . Sucralfate Other (See Comments)    Throat tightness  . Sulfa Antibiotics     Other reaction(s): Unknown   REVIEW OF SYSTEMS:  Review of Systems  Constitutional: Positive for malaise/fatigue and weight loss. Negative for diaphoresis and fever.  HENT: Negative for ear discharge, ear pain, hearing loss, nosebleeds, sore throat and tinnitus.   Eyes: Negative for blurred vision and pain.  Respiratory: Positive for cough and shortness of breath. Negative for hemoptysis and wheezing.   Cardiovascular: Positive for chest pain. Negative for palpitations, orthopnea and leg swelling.  Gastrointestinal: Negative for abdominal pain, blood in stool, constipation, diarrhea, heartburn, nausea and vomiting.  Genitourinary: Negative for dysuria, frequency and urgency.    Musculoskeletal: Negative for back pain and myalgias.  Skin: Negative for itching and rash.  Neurological: Negative for dizziness, tingling, tremors, focal weakness, seizures, weakness and headaches.  Psychiatric/Behavioral: Negative for depression. The patient is not nervous/anxious.    MEDICATIONS AT HOME:   Prior to Admission medications   Medication Sig Start Date End Date Taking? Authorizing Provider  albuterol (PROVENTIL HFA;VENTOLIN HFA) 108 (90 Base) MCG/ACT inhaler Inhale 2 puffs into the lungs every 6 (six) hours as needed for wheezing or shortness of breath.   Yes [provider]  budesonide (PULMICORT) 0.5 MG/2ML nebulizer solution Inhale into the lungs. 03/16/19 03/15/20 Yes [provider]  cloNIDine (CATAPRES - DOSED IN MG/24 HR) 0.2 mg/24hr patch Place 1 patch (0.2 mg total) onto the skin once a week. 06/29/17  Yes Darylene Price A, FNP  clopidogrel (PLAVIX) 75 MG tablet Take 1 tablet (75 mg total) by mouth daily. 09/27/16  Yes Dustin Flock, MD  docusate sodium (COLACE) 100 MG capsule Take 100 mg by mouth daily  as needed for mild constipation. Takes daily   Yes [provider]  doxazosin (CARDURA) 8 MG tablet Take 1 tablet (8 mg total) by mouth daily. 03/15/19  Yes Minna Merritts, MD  erythromycin ophthalmic ointment  06/14/18  Yes [provider]  furosemide (LASIX) 20 MG tablet Take 1 tablet (20 mg) by mouth once daily as needed for weight gain/ shortness of breath/ swelling 04/05/18  Yes Gollan, Kathlene November, MD  hydrALAZINE (APRESOLINE) 100 MG tablet Take 100 mg by mouth 3 (three) times daily. 05/21/19  Yes [provider]  ipratropium-albuterol (DUONEB) 0.5-2.5 (3) MG/3ML SOLN Take 3 mLs by nebulization every 4 (four) hours as needed (shortness of breath/wheezing.). 10/11/15  Yes Henreitta Leber, MD  levothyroxine (SYNTHROID) 88 MCG tablet  06/20/19  Yes [provider]  lisinopril (ZESTRIL) 10 MG tablet Take 1 tablet (10 mg  total) by mouth daily. 03/15/19  Yes Gollan, Kathlene November, MD  montelukast (SINGULAIR) 10 MG tablet TAKE 1 TABLET BY MOUTH EVERY DAY AT NIGHT 03/24/19  Yes [provider]  oxybutynin (DITROPAN-XL) 5 MG 24 hr tablet Take 5 mg by mouth daily.    Yes [provider]  pantoprazole (PROTONIX) 40 MG tablet Take 20 mg by mouth daily.    Yes [provider]  simvastatin (ZOCOR) 40 MG tablet Take 1 tablet (40 mg total) by mouth at bedtime. 03/22/18  Yes Minna Merritts, MD  SYMBICORT 160-4.5 MCG/ACT inhaler Inhale 2 puffs into the lungs 2 (two) times daily.  11/15/14  Yes [provider]  levofloxacin (LEVAQUIN) 500 MG tablet Take 500 mg by mouth daily. 07/29/19   [provider]  metFORMIN (GLUCOPHAGE) 1000 MG tablet Take 1,000 mg by mouth 2 (two) times daily with a meal.    [provider]  predniSONE (DELTASONE) 20 MG tablet Take 60 mg by mouth daily. 07/29/19   [provider]    VITAL SIGNS:  Blood pressure 134/85, pulse 86, temperature 98.3 F (36.8 C), temperature source Oral, resp. rate 19, height 5\' 2"  (1.575 m), weight 80.7 kg, SpO2 98 %. PHYSICAL EXAMINATION:  Physical Exam  GENERAL:  86 y.o.-year-old patient lying in the bed with no acute distress.  EYES: Pupils equal, round, reactive to light and accommodation. No scleral icterus. Extraocular muscles intact.  HEENT: Head atraumatic, normocephalic. Oropharynx and nasopharynx clear.  NECK:  Supple, no jugular venous distention. No thyroid enlargement, no tenderness.  LUNGS: Normal breath sounds bilaterally, no wheezing, rales,rhonchi or crepitation. No use of accessory muscles of respiration.  CARDIOVASCULAR: S1, S2 normal. No murmurs, rubs, or gallops.  ABDOMEN: Soft, nontender, nondistended. Bowel sounds present. No organomegaly or mass.  EXTREMITIES: No pedal edema, cyanosis, or clubbing.  NEUROLOGIC: Cranial nerves II through XII are intact. Muscle strength 5/5 in all extremities.  Sensation intact. Gait not checked.  PSYCHIATRIC: The patient is alert and oriented x 3.  SKIN: No obvious rash, lesion, or ulcer.  LABORATORY PANEL:   CBC Recent Labs  Lab 08/06/19 0928  WBC 10.5  HGB 10.0*  HCT 30.0*  PLT 203   ------------------------------------------------------------------------------------------------------------------  Chemistries  Recent Labs  Lab 08/06/19 0928 08/06/19 1249  NA 138  --   K 3.8  --   CL 101  --   CO2 22  --   GLUCOSE 119*  --   BUN 52*  --   CREATININE 2.79*  --   CALCIUM 8.4*  --   AST  --  17  ALT  --  13  ALKPHOS  --  66  BILITOT  --  0.9   ------------------------------------------------------------------------------------------------------------------  Cardiac Enzymes No results for input(s): TROPONINI in the last 168 hours. ------------------------------------------------------------------------------------------------------------------  RADIOLOGY:  DG Chest 2 View  Result Date: 08/06/2019 CLINICAL DATA:  Chest pain and shortness of breath EXAM: CHEST - 2 VIEW COMPARISON:  July 28, 2019 FINDINGS: There is no edema or airspace opacity. Heart is upper normal in size with pulmonary vascularity normal. No adenopathy. There is a moderate hiatal hernia, stable. There is aortic atherosclerosis. No pneumothorax. No bone lesions. IMPRESSION: No edema or airspace opacity. Stable cardiac silhouette. Hiatal hernia present. No evident adenopathy. Aortic Atherosclerosis (ICD10-I70.0). Electronically Signed   By: Lowella Grip III M.D.   On: 08/06/2019 10:06   CT Chest Wo Contrast  Result Date: 08/06/2019 CLINICAL DATA:  Nonspecific chest pain EXAM: CT CHEST WITHOUT CONTRAST TECHNIQUE: Multidetector CT imaging of the chest was performed following the standard protocol without IV contrast. COMPARISON:  01/13/2017 FINDINGS: Cardiovascular: Normal heart size. No pericardial effusion. Extensive atherosclerotic calcification of the aorta,  great vessels, and coronaries. Mediastinum/Nodes: Moderate sliding hiatal hernia.  No adenopathy. Lungs/Pleura: There is no edema, consolidation, effusion, or pneumothorax. Scattered pulmonary nodules measuring up to 5 mm in the left lower lobe 7 mm in the right upper lobe. These are stable from prior and considered benign. Mild subpleural atelectasis or scarring in the apical lungs. Upper Abdomen: Small calcification and left upper pole cystic density. Musculoskeletal: Extensive spondylosis with multi-level thoracic bridging. No evidence of fracture or erosion. IMPRESSION: 1. No acute finding. 2. Extensive atherosclerosis. 3. Scattered pulmonary nodules that are stable and benign. 4. Moderate hiatal hernia. Aortic Atherosclerosis (ICD10-I70.0). Electronically Signed   By: Monte Fantasia M.D.   On: 08/06/2019 13:41   IMPRESSION AND PLAN:  67 y f with a known history of CAD, CKD IV, diabetes and hypertension, is being admitted for chest pain.  * Chest pain - admit for obs - tele/PCU - serial troponins - trending up so far, start heparin drip - ekg not s/o ischemia - echo, myoview in am - cardio c/s - asa, nitro, statin - unable to obtain CT angio to r/o PE due to her renal function. Will obtain LE dopplers to r.o DVT - cxr and CT chest without contract not showing acute patho  * Anemia of CKD Patient's iron stores are now within normal limits. last received treatment with Feraheme on June 22, 2019. - received Retacrit on 6/3 at cancer center.  2. Pulmonary nodules: Repeat CT scan from January 13, 2017 revealed stable pulmonary nodules.  No further imaging is necessary.    * CKD 4 : Creatinine 2.79, GFR 14. Will c/s nephro  * Essential Hypertension: Blood pressure mildly elevated today. Continue clonidine, cardura, hydralazine and add nitro  * Shortness of breath/cough/congestion: No active cardiopulmonary disease on chest x-ray/CT today. Check COVID Just completed Abx on 6/10.  *  Hypothroidism: check TSH, continue synthroid    All the records are reviewed and case discussed with ED provider. Management plans discussed with the patient, family (DAUGHTER AT BEDSIDE) and they are in agreement.  CODE STATUS: DNR  TOTAL TIME TAKING CARE OF THIS PATIENT: 45 minutes.    Max Sane M.D on 08/06/2019 at 4:25 PM  [facility]   CC: Primary care physician; [name], MD   Note: This dictation was prepared with Dragon dictation along with smaller phrase technology. Any transcriptional errors that result from this process are unintentional.

## 2019-08-06 NOTE — Consult Note (Signed)
Ray for Heparin Infusion Indication: chest pain/ACS  Allergies  Allergen Reactions  . Losartan Other (See Comments)    Hyperkalemia  . Morphine And Related Shortness Of Breath    Rash, difficulty breathing, nausea.   . Atenolol Other (See Comments)    Other reaction(s): Other (See Comments) Decreased heart rate Decreased heart rate  . Codeine     Rash, difficulty breathing, nausea.  . Nsaids Other (See Comments)    Other reaction(s): Unknown  . Rofecoxib     Other reaction(s): Unknown  . Sucralfate Other (See Comments)    Throat tightness  . Sulfa Antibiotics     Other reaction(s): Unknown    Patient Measurements: Height: 5\' 2"  (157.5 cm) Weight: 80.7 kg (178 lb) IBW/kg (Calculated) : 50.1 Heparin Dosing Weight: 68.1 kg  Vital Signs: Temp: 98.3 F (36.8 C) (06/12 0916) Temp Source: Oral (06/12 0916) BP: 151/49 (06/12 1430) Pulse Rate: 86 (06/12 1430)  Labs: Recent Labs    08/06/19 0928 08/06/19 1249  HGB 10.0*  --   HCT 30.0*  --   PLT 203  --   CREATININE 2.79*  --   TROPONINIHS 27* 26*    Estimated Creatinine Clearance: 13.2 mL/min (A) (by C-G formula based on SCr of 2.79 mg/dL (H)).   Medical History: Past Medical History:  Diagnosis Date  . Bladder incontinence   . CAD (coronary artery disease)    a. 05/2009 Cath: LAD 90p (Xience 2.75 x 12 mm DES), 36m, D1 40, LCx 40p/m, RCA 30/40/30p/m, RCA 30/25d;  b.  12/2011 Lexiscan MV: no ischemia, breast attenuation artifact, normal EF-->Low risk; c. 10/2016 MV: fixed apical defect, most likely apical thinning and attenuation, No ischemia, EF 60%.  . Cancer ([place])    ovarian  . Carotid arterial disease w/ R Carotid Bruit (HCC)    a. 08/2016 Carotid U/S: 40-59% bilat ICA stenosis - f/u 1 yr.  . Chronic diastolic (congestive) heart failure ([place])    a. Echo 09/2015: EF 55-60% w/ Grade 1 DD, sev Ca2+ MV annulus, mildly dil LA; b. 09/2016 Echo: EF 65-70%, Gr2 DD, mildly dil  LA/RA, nl RV fxn.  . Chronic Dyspnea on exertion   . CKD (chronic kidney disease) stage 3, GFR 30-59 ml/min   . Degenerative arthritis of knee    bilateral knees  . Diabetes mellitus    Type II  . GIB (gastrointestinal bleeding)    a. 08/2016 Admission w/ presyncope/anemia/melena-->req Transfusion-->endo ok, colonoscopy w/ polyps but no source of bleeding (most likely diverticular).  . Hiatal hernia   . Hypertension   . Iron deficiency   . Menopausal symptoms   . Morbid obesity ([place])   . Renal insufficiency   . Thyroid disease    hypothyroidism    Medications:  No anticoagulation prior to admission per chart review  Assessment: Patient is a 86 y/o F with medical history including coronary artery disease, diastolic CHF, CKD stage IV who presented to the ED complaining of chest pain. Pharmacy has been consulted to initiate heparin infusion for ACS.  Baseline CBC with Hgb 10 (c/w baseline), platelets within normal limits. Baseline aPTT and PT-INR pending.  Goal of Therapy:  Heparin level 0.3-0.7 units/ml Monitor platelets by anticoagulation protocol: Yes   Plan:  -Heparin 3000 units IV bolus x 1 followed by continuous infusion at 800 units/hr -Heparin level 8 hours after initiation of infusion -Daily CBC per protocol  [place] Resident 08/06/2019,3:11 PM

## 2019-08-06 NOTE — ED Notes (Signed)
Report attempted to floor, 2A, report refused, charge nurse notified.

## 2019-08-07 ENCOUNTER — Observation Stay: Admit: 2019-08-07 | Payer: Medicare Other

## 2019-08-07 DIAGNOSIS — R079 Chest pain, unspecified: Secondary | ICD-10-CM | POA: Diagnosis not present

## 2019-08-07 DIAGNOSIS — I2510 Atherosclerotic heart disease of native coronary artery without angina pectoris: Secondary | ICD-10-CM | POA: Diagnosis not present

## 2019-08-07 LAB — CBC
HCT: 25.8 % — ABNORMAL LOW (ref 36.0–46.0)
Hemoglobin: 8.4 g/dL — ABNORMAL LOW (ref 12.0–15.0)
MCH: 32.1 pg (ref 26.0–34.0)
MCHC: 32.6 g/dL (ref 30.0–36.0)
MCV: 98.5 fL (ref 80.0–100.0)
Platelets: 166 10*3/uL (ref 150–400)
RBC: 2.62 MIL/uL — ABNORMAL LOW (ref 3.87–5.11)
RDW: 14.6 % (ref 11.5–15.5)
WBC: 8.7 10*3/uL (ref 4.0–10.5)
nRBC: 0 % (ref 0.0–0.2)

## 2019-08-07 LAB — HEPARIN LEVEL (UNFRACTIONATED)
Heparin Unfractionated: 0.45 IU/mL (ref 0.30–0.70)
Heparin Unfractionated: 0.41 IU/mL (ref 0.30–0.70)

## 2019-08-07 LAB — TROPONIN I (HIGH SENSITIVITY)
Troponin I (High Sensitivity): 29 ng/L — ABNORMAL HIGH (ref <18)
Troponin I (High Sensitivity): 26 ng/L — ABNORMAL HIGH (ref <18)

## 2019-08-07 MED ORDER — LISINOPRIL 2.5 MG PO TABS: 2.5000 mg | ORAL_TABLET | Freq: Every day | ORAL | 0 refills | Status: DC

## 2019-08-07 MED ORDER — DICLOFENAC SODIUM 1 % EX CREA: 1.0000 "application " | TOPICAL_CREAM | Freq: Three times a day (TID) | CUTANEOUS | 0 refills | Status: DC | PRN

## 2019-08-07 MED ORDER — CYCLOBENZAPRINE HCL 5 MG PO TABS: 5.0000 mg | ORAL_TABLET | Freq: Three times a day (TID) | ORAL | 0 refills | Status: DC | PRN

## 2019-08-07 MED ORDER — LEVOTHYROXINE SODIUM 88 MCG PO TABS
88.0000 ug | ORAL_TABLET | Freq: Every day | ORAL | Status: DC
  Filled 2019-08-07: qty 1

## 2019-08-07 MED ORDER — HYDROMORPHONE HCL 1 MG/ML IJ SOLN
0.5000 mg | INTRAMUSCULAR | Status: DC | PRN
  Administered 2019-08-07: 0.5 mg via INTRAVENOUS
  Filled 2019-08-07: qty 1

## 2019-08-07 MED ORDER — CYCLOBENZAPRINE HCL 5 MG PO TABS: 5.0000 mg | ORAL_TABLET | Freq: Three times a day (TID) | ORAL | 0 refills | Status: AC | PRN

## 2019-08-07 NOTE — Discharge Instructions (Signed)

## 2019-08-07 NOTE — ED Notes (Signed)
Pt back to bed. Pt states chair was a "lifesaver" and more comfortable than the bed.

## 2019-08-07 NOTE — ED Notes (Addendum)
This RN asked Dr. Damita Dunnings about parameters for hydralazine and nitropaste. Dr. Damita Dunnings will inform as to how to proceed. Meds held at this time while waiting for orders from Dr. Damita Dunnings. Levada Dy RN on 2A informed.

## 2019-08-07 NOTE — Progress Notes (Signed)
[facility], [place] 08/07/19  Subjective:   LOS: 0 06/12 0701 - 06/13 0700 In: -  Out: 300 [Urine:300]  Patient is known to [place] from outpatient follow-up She presented to the emergency room for right-sided chest pain that radiated to the back.  She had completed her antibiotics for bronchitis.  Nephrology consult was requested for worsening of serum creatinine Outpatient, she was volume overloaded therefore dose of Lasix was increased for about a week.  Lower extremity edema has improved significantly Today, chest pain is better.  It is deemed to be musculoskeletal as she has some point tenderness in her right lower scapular area.   Objective:  Vital signs in last 24 hours:  Temp:  [97.5 F (36.4 C)-98.5 F (36.9 C)] 97.5 F (36.4 C) (06/13 0718) Pulse Rate:  [65-102] 66 (06/13 0718) Resp:  [13-31] 16 (06/13 0718) BP: (116-169)/(36-106) 157/43 (06/13 0718) SpO2:  [91 %-100 %] 96 % (06/13 0718) Weight:  [75.8 kg] 75.8 kg (06/13 0114)  Weight change:  Filed Weights   08/06/19 0917 08/07/19 0114  Weight: 80.7 kg 75.8 kg    Intake/Output:    Intake/Output Summary (Last 24 hours) at 08/07/2019 0092 Last data filed at 08/07/2019 0930 Gross per 24 hour  Intake 158.15 ml  Output 300 ml  Net -141.85 ml     Physical Exam: General:  No acute distress, sitting up in the recliner chair  HEENT  moist oral mucous membranes  Pulm/lungs  normal breathing effort, mild rhonchi at bases otherwise clear  CVS/Heart  no rub  Abdomen:   Soft, nontender  Extremities:  Trace to 1+ pitting edema bilaterally  Neurologic:  Alert, oriented  Skin:  No acute rashes  Access:        Basic Metabolic Panel:  Recent Labs  Lab 08/06/19 0928 08/06/19 1626  NA 138  --   K 3.8  --   CL 101  --   CO2 22  --   GLUCOSE 119*  --   BUN 52*  --   CREATININE 2.79*  --   CALCIUM 8.4*  --   MG  --  1.8     CBC: Recent Labs  Lab 08/06/19 0928 08/07/19 0611   WBC 10.5 8.7  HGB 10.0* 8.4*  HCT 30.0* 25.8*  MCV 96.2 98.5  PLT 203 166     No results found for: HEPBSAG, HEPBSAB, HEPBIGM    Microbiology:  Recent Results (from the past 240 hour(s))  SARS Coronavirus 2 by RT PCR (hospital order, performed in [place] hospital lab) Nasopharyngeal Nasopharyngeal Swab     Status: None   Collection Time: 08/06/19  4:26 PM   Specimen: Nasopharyngeal Swab  Result Value Ref Range Status   SARS Coronavirus 2 NEGATIVE NEGATIVE Final    Comment: (NOTE) SARS-CoV-2 target nucleic acids are NOT DETECTED.  The SARS-CoV-2 RNA is generally detectable in upper and lower respiratory specimens during the acute phase of infection. The lowest concentration of SARS-CoV-2 viral copies this assay can detect is 250 copies / mL. A negative result does not preclude SARS-CoV-2 infection and should not be used as the sole basis for treatment or other patient management decisions.  A negative result may occur with improper specimen collection / handling, submission of specimen other than nasopharyngeal swab, presence of viral mutation(s) within the areas targeted by this assay, and inadequate number of viral copies (<250 copies / mL). A negative result must be combined with clinical observations, patient history,  and epidemiological information.  Fact Sheet for Patients:   StrictlyIdeas.no  Fact Sheet for Healthcare Providers: BankingDealers.co.za  This test is not yet approved or  cleared by the [place] FDA and has been authorized for detection and/or diagnosis of SARS-CoV-2 by FDA under an Emergency Use Authorization (EUA).  This EUA will remain in effect (meaning this test can be used) for the duration of the COVID-19 declaration under Section 564(b)(1) of the Act, 21 U.S.C. section 360bbb-3(b)(1), unless the authorization is terminated or revoked sooner.  Performed at [facility], [address] [place], [place] [address]     Coagulation Studies: Recent Labs    08/06/19 1626  LABPROT 14.3  INR 1.2    Urinalysis: No results for input(s): COLORURINE, LABSPEC, PHURINE, GLUCOSEU, HGBUR, BILIRUBINUR, KETONESUR, PROTEINUR, UROBILINOGEN, NITRITE, LEUKOCYTESUR in the last 72 hours.  Invalid input(s): APPERANCEUR    Imaging: DG Chest 2 View  Result Date: 08/06/2019 CLINICAL DATA:  Chest pain and shortness of breath EXAM: CHEST - 2 VIEW COMPARISON:  July 28, 2019 FINDINGS: There is no edema or airspace opacity. Heart is upper normal in size with pulmonary vascularity normal. No adenopathy. There is a moderate hiatal hernia, stable. There is aortic atherosclerosis. No pneumothorax. No bone lesions. IMPRESSION: No edema or airspace opacity. Stable cardiac silhouette. Hiatal hernia present. No evident adenopathy. Aortic Atherosclerosis (ICD10-I70.0). Electronically Signed   By: Lowella Grip III M.D.   On: 08/06/2019 10:06   CT Chest Wo Contrast  Result Date: 08/06/2019 CLINICAL DATA:  Nonspecific chest pain EXAM: CT CHEST WITHOUT CONTRAST TECHNIQUE: Multidetector CT imaging of the chest was performed following the standard protocol without IV contrast. COMPARISON:  01/13/2017 FINDINGS: Cardiovascular: Normal heart size. No pericardial effusion. Extensive atherosclerotic calcification of the aorta, great vessels, and coronaries. Mediastinum/Nodes: Moderate sliding hiatal hernia.  No adenopathy. Lungs/Pleura: There is no edema, consolidation, effusion, or pneumothorax. Scattered pulmonary nodules measuring up to 5 mm in the left lower lobe 7 mm in the right upper lobe. These are stable from prior and considered benign. Mild subpleural atelectasis or scarring in the apical lungs. Upper Abdomen: Small calcification and left upper pole cystic density. Musculoskeletal: Extensive spondylosis with multi-level thoracic bridging. No evidence of fracture or erosion. IMPRESSION: 1. No acute  finding. 2. Extensive atherosclerosis. 3. Scattered pulmonary nodules that are stable and benign. 4. Moderate hiatal hernia. Aortic Atherosclerosis (ICD10-I70.0). Electronically Signed   By: Monte Fantasia M.D.   On: 08/06/2019 13:41   US Venous Img Lower Bilateral (DVT)  Result Date: 08/06/2019 CLINICAL DATA:  Lower extremity swelling EXAM: BILATERAL LOWER EXTREMITY VENOUS DOPPLER ULTRASOUND TECHNIQUE: Gray-scale sonography with compression, as well as color and duplex ultrasound, were performed to evaluate the deep venous system(s) from the level of the common femoral vein through the popliteal and proximal calf veins. COMPARISON:  02/10/2019 right lower extremity venous Doppler scan FINDINGS: VENOUS Normal compressibility of the common femoral, superficial femoral, and popliteal veins, as well as the visualized calf veins. Visualized portions of profunda femoral vein and great saphenous vein unremarkable. No filling defects to suggest DVT on grayscale or color Doppler imaging. Doppler waveforms show normal direction of venous flow, normal respiratory plasticity and response to augmentation. Limited views of the contralateral common femoral vein are unremarkable. OTHER None. Limitations: none IMPRESSION: No evidence of deep venous thrombosis in either lower extremity. Electronically Signed   By: Ilona Sorrel M.D.   On: 08/06/2019 16:23     Medications:    . aspirin  EC  81 mg Oral Daily  . [START ON 08/11/2019] cloNIDine  0.2 mg Transdermal Weekly  . clopidogrel  75 mg Oral Daily  . doxazosin  8 mg Oral Daily  . hydrALAZINE  100 mg Oral TID  . levothyroxine  88 mcg Oral QAC breakfast  . mometasone-formoterol  2 puff Inhalation BID  . nitroGLYCERIN  0.5 inch Topical Q6H  . oxybutynin  5 mg Oral Daily  . pantoprazole  20 mg Oral Daily  . simvastatin  40 mg Oral QHS   acetaminophen, albuterol, ALPRAZolam, HYDROcodone-acetaminophen, HYDROmorphone (DILAUDID) injection, ipratropium-albuterol,  nitroGLYCERIN, ondansetron (ZOFRAN) IV  Assessment/ Plan:  86 y.o. female with hypertension, anemia, atrial fibrillation, COPD, coronary disease, history of kidney cysts, renal artery stenosis  Active Problems:   Chest pain   #.  AKI on CKD st 4 Recent Labs    08/06/19 0928  CREATININE 2.79*  Baseline creatinine of 2.41, GFR 17 from July 26, 2019 Increase in creatinine may be related to volume shifts due to aggressive diuresis We will continue to monitor closely  #. Anemia of CKD  Lab Results  Component Value Date   HGB 8.4 (L) 08/07/2019  Will consider Epogen therapy as outpatient  #.  Lower extremity edema Improved with higher dose of Lasix Continue to follow low-salt diet May reduce Lasix to 20 mg daily which is her usual dose  #. HTN with CKD and renal artery stenosis Patient is status post renal angiography and LEFT renal artery stent on February 07, 2019.  Patient has highly calcific and moderately irregular infrarenal segments of the aorta.  Patient will follow up with Dr. Lucky Cowboy as outpatient Blood pressure today 157/43 which is acceptable for age and general condition.  Continue home regimen.  #. Diabetes type 2 with CKD Hemoglobin A1C (%)  Date Value  10/15/2012 8.6 (H)   Hgb A1c MFr Bld (%)  Date Value  10/01/2015 5.6     LOS: 0 Amy [name] 6/13/20219:37 AM  [facility] [place], [place]

## 2019-08-07 NOTE — Progress Notes (Signed)
Amy [name] to be D/C'd Home per MD order.  Discussed prescriptions and follow up appointments with the patient. Prescriptions electronically submitted.medication list explained in detail. Pt and daughter verbalized understanding.  Allergies as of 08/07/2019      Reactions   Losartan Other (See Comments)   Hyperkalemia   Morphine And Related Shortness Of Breath   Rash, difficulty breathing, nausea.   Atenolol Other (See Comments)   Other reaction(s): Other (See Comments) Decreased heart rate Decreased heart rate   Codeine    Rash, difficulty breathing, nausea.   Nsaids Other (See Comments)   Other reaction(s): Unknown   Rofecoxib    Other reaction(s): Unknown   Sucralfate Other (See Comments)   Throat tightness   Sulfa Antibiotics    Other reaction(s): Unknown      Medication List    STOP taking these medications   levofloxacin 500 MG tablet Commonly known as: LEVAQUIN   metFORMIN 1000 MG tablet Commonly known as: GLUCOPHAGE   predniSONE 20 MG tablet Commonly known as: DELTASONE     TAKE these medications   albuterol 108 (90 Base) MCG/ACT inhaler Commonly known as: VENTOLIN HFA Inhale 2 puffs into the lungs every 6 (six) hours as needed for wheezing or shortness of breath.   budesonide 0.5 MG/2ML nebulizer solution Commonly known as: PULMICORT Inhale into the lungs.   cloNIDine 0.2 mg/24hr patch Commonly known as: CATAPRES - Dosed in mg/24 hr Place 1 patch (0.2 mg total) onto the skin once a week.   clopidogrel 75 MG tablet Commonly known as: PLAVIX Take 1 tablet (75 mg total) by mouth daily.   cyclobenzaprine 5 MG tablet Commonly known as: FLEXERIL Take 1 tablet (5 mg total) by mouth 3 (three) times daily as needed for up to 3 days for muscle spasms.   Diclofenac Sodium 1 % Crea Apply 1 application topically 3 (three) times daily as needed.   docusate sodium 100 MG capsule Commonly known as: COLACE Take 100 mg by mouth daily as needed for mild  constipation. Takes daily   doxazosin 8 MG tablet Commonly known as: CARDURA Take 1 tablet (8 mg total) by mouth daily.   erythromycin ophthalmic ointment   furosemide 20 MG tablet Commonly known as: LASIX Take 1 tablet (20 mg) by mouth once daily as needed for weight gain/ shortness of breath/ swelling   hydrALAZINE 100 MG tablet Commonly known as: APRESOLINE Take 100 mg by mouth 3 (three) times daily.   ipratropium-albuterol 0.5-2.5 (3) MG/3ML Soln Commonly known as: DUONEB Take 3 mLs by nebulization every 4 (four) hours as needed (shortness of breath/wheezing.).   levothyroxine 88 MCG tablet Commonly known as: SYNTHROID   lisinopril 2.5 MG tablet Commonly known as: ZESTRIL Take 1 tablet (2.5 mg total) by mouth daily. What changed:   medication strength  how much to take   montelukast 10 MG tablet Commonly known as: SINGULAIR TAKE 1 TABLET BY MOUTH EVERY DAY AT NIGHT   oxybutynin 5 MG 24 hr tablet Commonly known as: DITROPAN-XL Take 5 mg by mouth daily.   pantoprazole 40 MG tablet Commonly known as: PROTONIX Take 20 mg by mouth daily.   simvastatin 40 MG tablet Commonly known as: ZOCOR Take 1 tablet (40 mg total) by mouth at bedtime.   Symbicort 160-4.5 MCG/ACT inhaler Generic drug: budesonide-formoterol Inhale 2 puffs into the lungs 2 (two) times daily.       Vitals:   08/07/19 0414 08/07/19 0718  BP: (!) 169/44 (!) 157/43  Pulse: 75 66  Resp: 20 16  Temp: 98.4 F (36.9 C) (!) 97.5 F (36.4 C)  SpO2: 98% 96%    Skin clean, dry and intact without evidence of skin break down, no evidence of skin tears noted. IV catheter discontinued intact. Site without signs and symptoms of complications. Dressing and pressure applied. Pt denies pain at this time. No complaints noted.  An After Visit Summary was printed and given to the patient. Patient escorted via [place], and D/C home via private auto.  [place]

## 2019-08-07 NOTE — Progress Notes (Signed)
Cardiology Consultation:   Patient ID: Amy [name] MRN: [id number]; DOB: 1927-04-28  Admit date: 08/06/2019 Date of Consult: 08/07/2019  Primary Care Provider: [name], MD [facility] Cardiologist: Ida Rogue, MD  [facility] Electrophysiologist:  None    Patient Profile:   Amy [name] is a 86 y.o. female with a hx of CAD/PCI to LAD, COPD who is being seen today for the evaluation of chest pain at the request of Dr. Manuella Ghazi.  History of Present Illness:   Amy [name] CAD status post drug-eluting stent to the mid LAD, COPD, CKD who presents due to right scapular pain with radiation to her mid chest.  Patient states having cough and shortness of breath for the past week associated with a yellow phlegm production.  She was diagnosed with bronchitis and started on antibiotics and steroids which she took for 7 days.  Her symptoms improve while she was taking the antibiotics and steroids.  When she stopped the antibiotics 3 days ago, she suddenly developed right scapular pain that radiates to her chest.  She describes pain as sharp.  Symptoms were not related to exertion.  In the ED, troponins were 27, 26, 28.  EKG showed sinus rhythm with right bundle branch block.  Patient was started on heparin per ACS protocol.  She was given fentanyl which helped with her symptoms but record moments later.  Originally, palpation made the right scapula pain worse.  During my exam, patient was with daughter in room.  Palpation/massage of the right shoulder made her symptoms better.  She states her symptoms overall felt a little better compared to when she came in.   Past Medical History:  Diagnosis Date  . Bladder incontinence   . CAD (coronary artery disease)    a. 05/2009 Cath: LAD 90p (Xience 2.75 x 12 mm DES), 87m, D1 40, LCx 40p/m, RCA 30/40/30p/m, RCA 30/25d;  b.  12/2011 Lexiscan MV: no ischemia, breast attenuation artifact, normal EF-->Low risk; c. 10/2016 MV: fixed  apical defect, most likely apical thinning and attenuation, No ischemia, EF 60%.  . Cancer ([place])    ovarian  . Carotid arterial disease w/ R Carotid Bruit (HCC)    a. 08/2016 Carotid U/S: 40-59% bilat ICA stenosis - f/u 1 yr.  . Chronic diastolic (congestive) heart failure ([place])    a. Echo 09/2015: EF 55-60% w/ Grade 1 DD, sev Ca2+ MV annulus, mildly dil LA; b. 09/2016 Echo: EF 65-70%, Gr2 DD, mildly dil LA/RA, nl RV fxn.  . Chronic Dyspnea on exertion   . CKD (chronic kidney disease) stage 3, GFR 30-59 ml/min   . Degenerative arthritis of knee    bilateral knees  . Diabetes mellitus    Type II  . GIB (gastrointestinal bleeding)    a. 08/2016 Admission w/ presyncope/anemia/melena-->req Transfusion-->endo ok, colonoscopy w/ polyps but no source of bleeding (most likely diverticular).  . Hiatal hernia   . Hypertension   . Iron deficiency   . Menopausal symptoms   . Morbid obesity ([place])   . Renal insufficiency   . Thyroid disease    hypothyroidism    Past Surgical History:  Procedure Laterality Date  . COLONOSCOPY  2015  . COLONOSCOPY WITH PROPOFOL N/A 09/25/2016   Procedure: COLONOSCOPY WITH PROPOFOL;  Surgeon: [name], MD;  Location: [facility] ENDOSCOPY;  Service: Gastroenterology;  Laterality: N/A;  . COLONOSCOPY WITH PROPOFOL N/A 09/26/2016   Procedure: COLONOSCOPY WITH PROPOFOL;  Surgeon: [name], MD;  Location: [facility] ENDOSCOPY;  Service: Gastroenterology;  Laterality: N/A;  . ESOPHAGOGASTRODUODENOSCOPY (EGD) WITH PROPOFOL N/A 09/25/2016   Procedure: ESOPHAGOGASTRODUODENOSCOPY (EGD) WITH PROPOFOL;  Surgeon: [name], MD;  Location: [facility] ENDOSCOPY;  Service: Gastroenterology;  Laterality: N/A;  . RENAL ANGIOGRAPHY N/A 02/07/2019   Procedure: RENAL ANGIOGRAPHY;  Surgeon: [name], MD;  Location: [place] CV LAB;  Service: Cardiovascular;  Laterality: N/A;  . REPLACEMENT TOTAL KNEE BILATERAL    . TOTAL VAGINAL HYSTERECTOMY     ovarian mass, not cancerous  . UPPER GI  ENDOSCOPY  2015     Home Medications:  Prior to Admission medications   Medication Sig Start Date End Date Taking? Authorizing Provider  albuterol (PROVENTIL HFA;VENTOLIN HFA) 108 (90 Base) MCG/ACT inhaler Inhale 2 puffs into the lungs every 6 (six) hours as needed for wheezing or shortness of breath.   Yes [provider]  budesonide (PULMICORT) 0.5 MG/2ML nebulizer solution Inhale into the lungs. 03/16/19 03/15/20 Yes [provider]  cloNIDine (CATAPRES - DOSED IN MG/24 HR) 0.2 mg/24hr patch Place 1 patch (0.2 mg total) onto the skin once a week. 06/29/17  Yes Darylene Price A, FNP  clopidogrel (PLAVIX) 75 MG tablet Take 1 tablet (75 mg total) by mouth daily. 09/27/16  Yes Dustin Flock, MD  docusate sodium (COLACE) 100 MG capsule Take 100 mg by mouth daily as needed for mild constipation. Takes daily   Yes [provider]  doxazosin (CARDURA) 8 MG tablet Take 1 tablet (8 mg total) by mouth daily. 03/15/19  Yes Minna Merritts, MD  erythromycin ophthalmic ointment  06/14/18  Yes [provider]  furosemide (LASIX) 20 MG tablet Take 1 tablet (20 mg) by mouth once daily as needed for weight gain/ shortness of breath/ swelling 04/05/18  Yes Gollan, Kathlene November, MD  hydrALAZINE (APRESOLINE) 100 MG tablet Take 100 mg by mouth 3 (three) times daily. 05/21/19  Yes [provider]  ipratropium-albuterol (DUONEB) 0.5-2.5 (3) MG/3ML SOLN Take 3 mLs by nebulization every 4 (four) hours as needed (shortness of breath/wheezing.). 10/11/15  Yes Henreitta Leber, MD  levothyroxine (SYNTHROID) 88 MCG tablet  06/20/19  Yes [provider]  montelukast (SINGULAIR) 10 MG tablet TAKE 1 TABLET BY MOUTH EVERY DAY AT NIGHT 03/24/19  Yes [provider]  oxybutynin (DITROPAN-XL) 5 MG 24 hr tablet Take 5 mg by mouth daily.    Yes [provider]  pantoprazole (PROTONIX) 40 MG tablet Take 20 mg by mouth daily.    Yes [provider]  simvastatin  (ZOCOR) 40 MG tablet Take 1 tablet (40 mg total) by mouth at bedtime. 03/22/18  Yes Minna Merritts, MD  SYMBICORT 160-4.5 MCG/ACT inhaler Inhale 2 puffs into the lungs 2 (two) times daily.  11/15/14  Yes [provider]  cyclobenzaprine (FLEXERIL) 5 MG tablet Take 1 tablet (5 mg total) by mouth 3 (three) times daily as needed for up to 3 days for muscle spasms. 08/07/19 08/10/19  Max Sane, MD  Diclofenac Sodium 1 % CREA Apply 1 application topically 3 (three) times daily as needed. 08/07/19   Max Sane, MD  levofloxacin (LEVAQUIN) 500 MG tablet Take 500 mg by mouth daily. 07/29/19   [provider]  lisinopril (ZESTRIL) 2.5 MG tablet Take 1 tablet (2.5 mg total) by mouth daily. 08/07/19 09/06/19  Max Sane, MD  metFORMIN (GLUCOPHAGE) 1000 MG tablet Take 1,000 mg by mouth 2 (two) times daily with a meal.    [provider]  predniSONE (DELTASONE) 20 MG tablet Take 60  mg by mouth daily. 07/29/19   [provider]    Inpatient Medications: Scheduled Meds: . aspirin EC  81 mg Oral Daily  . [START ON 08/11/2019] cloNIDine  0.2 mg Transdermal Weekly  . clopidogrel  75 mg Oral Daily  . doxazosin  8 mg Oral Daily  . hydrALAZINE  100 mg Oral TID  . levothyroxine  88 mcg Oral QAC breakfast  . mometasone-formoterol  2 puff Inhalation BID  . nitroGLYCERIN  0.5 inch Topical Q6H  . oxybutynin  5 mg Oral Daily  . pantoprazole  20 mg Oral Daily  . simvastatin  40 mg Oral QHS   Continuous Infusions:  PRN Meds: acetaminophen, albuterol, ALPRAZolam, HYDROcodone-acetaminophen, HYDROmorphone (DILAUDID) injection, ipratropium-albuterol, nitroGLYCERIN, ondansetron (ZOFRAN) IV  Allergies:    Allergies  Allergen Reactions  . Losartan Other (See Comments)    Hyperkalemia  . Morphine And Related Shortness Of Breath    Rash, difficulty breathing, nausea.   . Atenolol Other (See Comments)    Other reaction(s): Other (See Comments) Decreased heart rate Decreased heart rate   . Codeine     Rash, difficulty breathing, nausea.  . Nsaids Other (See Comments)    Other reaction(s): Unknown  . Rofecoxib     Other reaction(s): Unknown  . Sucralfate Other (See Comments)    Throat tightness  . Sulfa Antibiotics     Other reaction(s): Unknown    Social History:   Social History   Socioeconomic History  . Marital status: Widowed    Spouse name: Not on file  . Number of children: Not on file  . Years of education: Not on file  . Highest education level: Not on file  Occupational History  . Not on file  Tobacco Use  . Smoking status: Never Smoker  . Smokeless tobacco: Never Used  Vaping Use  . Vaping Use: Never used  Substance and Sexual Activity  . Alcohol use: No  . Drug use: No  . Sexual activity: Not on file  Other Topics Concern  . Not on file  Social History Narrative  . Not on file   Social Determinants of Health   Financial Resource Strain:   . Difficulty of Paying Living Expenses:   Food Insecurity:   . Worried About Charity fundraiser in the Last Year:   . Arboriculturist in the Last Year:   Transportation Needs:   . Film/video editor (Medical):   Marland Kitchen Lack of Transportation (Non-Medical):   Physical Activity:   . Days of Exercise per Week:   . Minutes of Exercise per Session:   Stress:   . Feeling of Stress :   Social Connections:   . Frequency of Communication with Friends and Family:   . Frequency of Social Gatherings with Friends and Family:   . Attends Religious Services:   . Active Member of Clubs or Organizations:   . Attends Archivist Meetings:   Marland Kitchen Marital Status:   Intimate Partner Violence:   . Fear of Current or Ex-Partner:   . Emotionally Abused:   Marland Kitchen Physically Abused:   . Sexually Abused:     Family History:    Family History  Problem Relation Age of Onset  . Breast cancer Mother 43     ROS:  Please see the history of present illness.   All other ROS reviewed and negative.     Physical  Exam/Data:   Vitals:   08/07/19 0114 08/07/19 0120 08/07/19 0414 08/07/19  0718  BP:  (!) 153/53 (!) 169/44 (!) 157/43  Pulse:  65 75 66  Resp: 17 20 20 16   Temp:  98.5 F (36.9 C) 98.4 F (36.9 C) (!) 97.5 F (36.4 C)  TempSrc:  Oral Oral Oral  SpO2:  98% 98% 96%  Weight: 75.8 kg     Height: 5\' 2"  (1.575 m)       Intake/Output Summary (Last 24 hours) at 08/07/2019 1012 Last data filed at 08/07/2019 0930 Gross per 24 hour  Intake 158.15 ml  Output 300 ml  Net -141.85 ml   Last 3 Weights 08/07/2019 08/06/2019 08/05/2019  Weight (lbs) 167 lb 178 lb 178 lb  Weight (kg) 75.751 kg 80.74 kg 80.74 kg     Body mass index is 30.54 kg/m.  General:  Well nourished, well developed, in no acute distress HEENT: normal Lymph: no adenopathy Neck: no JVD Endocrine:  No thryomegaly Vascular: No carotid bruits; FA pulses 2+ bilaterally without bruits  Cardiac:  normal S1, S2; RRR; systolic murmur  Lungs:  clear to auscultation bilaterally, no wheezing, rhonchi or rales  Abd: soft, nontender, no hepatomegaly  Ext: no edema Musculoskeletal:  No deformities, right scapular tenderness felt better with palpation/massage Skin: warm and dry  Neuro:  CNs 2-12 intact, no focal abnormalities noted Psych:  Normal affect   EKG:  The EKG was personally reviewed and demonstrates: Sinus rhythm, right bundle branch block, left anterior fascicular block Telemetry:  Telemetry was personally reviewed and demonstrates: Sinus rhythm  Relevant CV Studies: Echo 09/2016 Left ventricle: The cavity size was normal. Wall thickness was  increased in a pattern of moderate LVH. Systolic function was  vigorous. The estimated ejection fraction was in the range of 65%  to 70%. Features are consistent with a pseudonormal left  ventricular filling pattern, with concomitant abnormal relaxation  and increased filling pressure (grade 2 diastolic dysfunction).  Doppler parameters are consistent with high  ventricular filling  pressure  Laboratory Data:  High Sensitivity Troponin:   Recent Labs  Lab 08/06/19 0928 08/06/19 1249 08/06/19 1626 08/07/19 0611  TROPONINIHS 27* 26* 28* 29*     Chemistry Recent Labs  Lab 08/06/19 0928  NA 138  K 3.8  CL 101  CO2 22  GLUCOSE 119*  BUN 52*  CREATININE 2.79*  CALCIUM 8.4*  GFRNONAA 14*  GFRAA 17*  ANIONGAP 15    Recent Labs  Lab 08/06/19 1249  PROT 5.6*  ALBUMIN 3.2*  AST 17  ALT 13  ALKPHOS 66  BILITOT 0.9   Hematology Recent Labs  Lab 08/06/19 0928 08/07/19 0611  WBC 10.5 8.7  RBC 3.12* 2.62*  HGB 10.0* 8.4*  HCT 30.0* 25.8*  MCV 96.2 98.5  MCH 32.1 32.1  MCHC 33.3 32.6  RDW 14.9 14.6  PLT 203 166   BNPNo results for input(s): BNP, PROBNP in the last 168 hours.  DDimer No results for input(s): DDIMER in the last 168 hours.   Radiology/Studies:  DG Chest 2 View  Result Date: 08/06/2019 CLINICAL DATA:  Chest pain and shortness of breath EXAM: CHEST - 2 VIEW COMPARISON:  July 28, 2019 FINDINGS: There is no edema or airspace opacity. Heart is upper normal in size with pulmonary vascularity normal. No adenopathy. There is a moderate hiatal hernia, stable. There is aortic atherosclerosis. No pneumothorax. No bone lesions. IMPRESSION: No edema or airspace opacity. Stable cardiac silhouette. Hiatal hernia present. No evident adenopathy. Aortic Atherosclerosis (ICD10-I70.0). Electronically Signed   By: Lowella Grip  III M.D.   On: 08/06/2019 10:06   CT Chest Wo Contrast  Result Date: 08/06/2019 CLINICAL DATA:  Nonspecific chest pain EXAM: CT CHEST WITHOUT CONTRAST TECHNIQUE: Multidetector CT imaging of the chest was performed following the standard protocol without IV contrast. COMPARISON:  01/13/2017 FINDINGS: Cardiovascular: Normal heart size. No pericardial effusion. Extensive atherosclerotic calcification of the aorta, great vessels, and coronaries. Mediastinum/Nodes: Moderate sliding hiatal hernia.  No  adenopathy. Lungs/Pleura: There is no edema, consolidation, effusion, or pneumothorax. Scattered pulmonary nodules measuring up to 5 mm in the left lower lobe 7 mm in the right upper lobe. These are stable from prior and considered benign. Mild subpleural atelectasis or scarring in the apical lungs. Upper Abdomen: Small calcification and left upper pole cystic density. Musculoskeletal: Extensive spondylosis with multi-level thoracic bridging. No evidence of fracture or erosion. IMPRESSION: 1. No acute finding. 2. Extensive atherosclerosis. 3. Scattered pulmonary nodules that are stable and benign. 4. Moderate hiatal hernia. Aortic Atherosclerosis (ICD10-I70.0). Electronically Signed   By: Monte Fantasia M.D.   On: 08/06/2019 13:41   US Venous Img Lower Bilateral (DVT)  Result Date: 08/06/2019 CLINICAL DATA:  Lower extremity swelling EXAM: BILATERAL LOWER EXTREMITY VENOUS DOPPLER ULTRASOUND TECHNIQUE: Gray-scale sonography with compression, as well as color and duplex ultrasound, were performed to evaluate the deep venous system(s) from the level of the common femoral vein through the popliteal and proximal calf veins. COMPARISON:  02/10/2019 right lower extremity venous Doppler scan FINDINGS: VENOUS Normal compressibility of the common femoral, superficial femoral, and popliteal veins, as well as the visualized calf veins. Visualized portions of profunda femoral vein and great saphenous vein unremarkable. No filling defects to suggest DVT on grayscale or color Doppler imaging. Doppler waveforms show normal direction of venous flow, normal respiratory plasticity and response to augmentation. Limited views of the contralateral common femoral vein are unremarkable. OTHER None. Limitations: none IMPRESSION: No evidence of deep venous thrombosis in either lower extremity. Electronically Signed   By: Ilona Sorrel M.D.   On: 08/06/2019 16:23          Assessment and Plan:   1.  Chest pain/right scapular  pain -Her symptoms are not consistent with a cardiac etiology. -Right shoulder/scapula tenderness better with palpation on my exam consistent with a musculoskeletal etiology -Stop heparin drip. -Consider muscle relaxant/physical therapy -No indication for stress test.  Prior echocardiogram showed normal ejection fraction. -Echo ordered, will review echocardiogram. -No additional cardiac testing indicated at this time.  If echocardiogram is normal, patient can be discharged from a cardiac perspective.  2.  History of CAD/PCI to LAD -Continue PTA aspirin, Plavix, statin.  3.  History of COPD -Recent/bronchitis -Management as per primary team    Signed, Kate Sable, MD  08/07/2019 10:12 AM

## 2019-08-07 NOTE — Consult Note (Signed)
[place] for Heparin Infusion Indication: chest pain/ACS  Allergies  Allergen Reactions  . Losartan Other (See Comments)    Hyperkalemia  . Morphine And Related Shortness Of Breath    Rash, difficulty breathing, nausea.   . Atenolol Other (See Comments)    Other reaction(s): Other (See Comments) Decreased heart rate Decreased heart rate  . Codeine     Rash, difficulty breathing, nausea.  . Nsaids Other (See Comments)    Other reaction(s): Unknown  . Rofecoxib     Other reaction(s): Unknown  . Sucralfate Other (See Comments)    Throat tightness  . Sulfa Antibiotics     Other reaction(s): Unknown    Patient Measurements: Height: 5\' 2"  (157.5 cm) Weight: 75.8 kg (167 lb) IBW/kg (Calculated) : 50.1 Heparin Dosing Weight: 68.1 kg  Vital Signs: Temp: 98.5 F (36.9 C) (06/13 0120) Temp Source: Oral (06/13 0120) BP: 153/53 (06/13 0120) Pulse Rate: 65 (06/13 0120)  Labs: Recent Labs    08/06/19 0928 08/06/19 1249 08/06/19 1626 08/07/19 0018  HGB 10.0*  --   --   --   HCT 30.0*  --   --   --   PLT 203  --   --   --   APTT  --   --  30  --   LABPROT  --   --  14.3  --   INR  --   --  1.2  --   HEPARINUNFRC  --   --   --  0.45  CREATININE 2.79*  --   --   --   TROPONINIHS 27* 26* 28*  --     Estimated Creatinine Clearance: 12.8 mL/min (A) (by C-G formula based on SCr of 2.79 mg/dL (H)).   Medical History: Past Medical History:  Diagnosis Date  . Bladder incontinence   . CAD (coronary artery disease)    a. 05/2009 Cath: LAD 90p (Xience 2.75 x 12 mm DES), 66m, D1 40, LCx 40p/m, RCA 30/40/30p/m, RCA 30/25d;  b.  12/2011 Lexiscan MV: no ischemia, breast attenuation artifact, normal EF-->Low risk; c. 10/2016 MV: fixed apical defect, most likely apical thinning and attenuation, No ischemia, EF 60%.  . Cancer ([place])    ovarian  . Carotid arterial disease w/ R Carotid Bruit (HCC)    a. 08/2016 Carotid U/S: 40-59% bilat ICA stenosis  - f/u 1 yr.  . Chronic diastolic (congestive) heart failure ([place])    a. Echo 09/2015: EF 55-60% w/ Grade 1 DD, sev Ca2+ MV annulus, mildly dil LA; b. 09/2016 Echo: EF 65-70%, Gr2 DD, mildly dil LA/RA, nl RV fxn.  . Chronic Dyspnea on exertion   . CKD (chronic kidney disease) stage 3, GFR 30-59 ml/min   . Degenerative arthritis of knee    bilateral knees  . Diabetes mellitus    Type II  . GIB (gastrointestinal bleeding)    a. 08/2016 Admission w/ presyncope/anemia/melena-->req Transfusion-->endo ok, colonoscopy w/ polyps but no source of bleeding (most likely diverticular).  . Hiatal hernia   . Hypertension   . Iron deficiency   . Menopausal symptoms   . Morbid obesity ([place])   . Renal insufficiency   . Thyroid disease    hypothyroidism    Medications:  No anticoagulation prior to admission per chart review  Assessment: Patient is a 86 y/o F with medical history including coronary artery disease, diastolic CHF, CKD stage IV who presented to the ED complaining of chest pain. Pharmacy has been  consulted to initiate heparin infusion for ACS.  Baseline CBC with Hgb 10 (c/w baseline), platelets within normal limits. Baseline aPTT and PT-INR pending.  [id number] HL 0.45, therapeutic x 1  Goal of Therapy:  Heparin level 0.3-0.7 units/ml Monitor platelets by anticoagulation protocol: Yes   Plan:  -Continue Heparin infusion at 800 units/hr -Heparin level in 8 hours to confirm -Daily CBC per protocol  Hart Robinsons A  08/07/2019,1:54 AM

## 2019-08-08 NOTE — Discharge Summary (Signed)
[facility] [place] [facility] NAME: Amy [name]    MR#:  [id number]  DATE OF BIRTH:  1927/11/27  DATE OF ADMISSION:  08/06/2019   ADMITTING PHYSICIAN: Max Sane, MD  DATE OF DISCHARGE: 08/07/2019 10:46 AM  PRIMARY CARE PHYSICIAN: [name], MD   ADMISSION DIAGNOSIS:  Swelling [R60.9] Chest pain [R07.9] DISCHARGE DIAGNOSIS:  Active Problems:   Coronary artery disease involving native coronary artery of native heart without angina pectoris   Chest pain  SECONDARY DIAGNOSIS:   Past Medical History:  Diagnosis Date  . Bladder incontinence   . CAD (coronary artery disease)    a. 05/2009 Cath: LAD 90p (Xience 2.75 x 12 mm DES), 19m, D1 40, LCx 40p/m, RCA 30/40/30p/m, RCA 30/25d;  b.  12/2011 Lexiscan MV: no ischemia, breast attenuation artifact, normal EF-->Low risk; c. 10/2016 MV: fixed apical defect, most likely apical thinning and attenuation, No ischemia, EF 60%.  . Cancer ([place])    ovarian  . Carotid arterial disease w/ R Carotid Bruit (HCC)    a. 08/2016 Carotid U/S: 40-59% bilat ICA stenosis - f/u 1 yr.  . Chronic diastolic (congestive) heart failure ([place])    a. Echo 09/2015: EF 55-60% w/ Grade 1 DD, sev Ca2+ MV annulus, mildly dil LA; b. 09/2016 Echo: EF 65-70%, Gr2 DD, mildly dil LA/RA, nl RV fxn.  . Chronic Dyspnea on exertion   . CKD (chronic kidney disease) stage 3, GFR 30-59 ml/min   . Degenerative arthritis of knee    bilateral knees  . Diabetes mellitus    Type II  . GIB (gastrointestinal bleeding)    a. 08/2016 Admission w/ presyncope/anemia/melena-->req Transfusion-->endo ok, colonoscopy w/ polyps but no source of bleeding (most likely diverticular).  . Hiatal hernia   . Hypertension   . Iron deficiency   . Menopausal symptoms   . Morbid obesity ([place])   . Renal insufficiency   . Thyroid disease    hypothyroidism   HOSPITAL COURSE:  Amy [name] is a 86 y.o. female with a hx of CAD/PCI to LAD, COPD admitted for chest  pain   Chest pain/right scapular pain -Her symptoms are not consistent with a cardiac etiology. -Right shoulder/scapula tenderness with palpation consistent with a musculoskeletal etiology -I have prescribed diclofenac gel and Robaxin as needed for short course.  Also recommended physical therapy -No indication for stress test.  Prior echocardiogram showed normal ejection fraction. -Negative serial troponins.  EKG not suggestive of any acute ischemia -She may have some anxiety/stress contributing to this -Continue outpatient palliative follow-up  2.  History of CAD/PCI to LAD -Continue aspirin, Plavix, statin.  3.  History of COPD -Recent/bronchitis and completion of antibiotic course  4.  CKD stage IV -baseline creatinine of 2.41 with a GFR of 17.  Some worsening may be due to aggressive diuresis. After discussion with nephrology dose of Lasix is reduced (20 mg once daily as needed now) along with lisinopril (10 mg daily -> 2.5 mg daily).  Metformin had already been stopped by primary care.  Will need close monitoring of renal function as an outpatient with nephrology. follows with Dr. Candiss Norse  5. Anemia of chronic kidney disease -Epogen as an outpatient with nephrology  6.  Hypertension with CKD stage IV and renal artery stenosis -follows with Dr. Lucky Cowboy and Dr. Candiss Norse   DISCHARGE CONDITIONS:  Stable CONSULTS OBTAINED:  Treatment Team:  Kate Sable, MD DRUG ALLERGIES:   Allergies  Allergen Reactions  . Losartan Other (See Comments)    Hyperkalemia  . Morphine And Related Shortness Of Breath    Rash, difficulty breathing, nausea.   . Atenolol Other (See Comments)    Other reaction(s): Other (See Comments) Decreased heart rate Decreased heart rate  . Codeine     Rash, difficulty breathing, nausea.  . Nsaids Other (See Comments)    Other reaction(s): Unknown  . Rofecoxib     Other reaction(s): Unknown  . Sucralfate Other (See Comments)    Throat tightness  . Sulfa  Antibiotics     Other reaction(s): Unknown   DISCHARGE MEDICATIONS:   Allergies as of 08/07/2019      Reactions   Losartan Other (See Comments)   Hyperkalemia   Morphine And Related Shortness Of Breath   Rash, difficulty breathing, nausea.   Atenolol Other (See Comments)   Other reaction(s): Other (See Comments) Decreased heart rate Decreased heart rate   Codeine    Rash, difficulty breathing, nausea.   Nsaids Other (See Comments)   Other reaction(s): Unknown   Rofecoxib    Other reaction(s): Unknown   Sucralfate Other (See Comments)   Throat tightness   Sulfa Antibiotics    Other reaction(s): Unknown      Medication List    STOP taking these medications   levofloxacin 500 MG tablet Commonly known as: LEVAQUIN   metFORMIN 1000 MG tablet Commonly known as: GLUCOPHAGE   predniSONE 20 MG tablet Commonly known as: DELTASONE     TAKE these medications   albuterol 108 (90 Base) MCG/ACT inhaler Commonly known as: VENTOLIN HFA Inhale 2 puffs into the lungs every 6 (six) hours as needed for wheezing or shortness of breath.   budesonide 0.5 MG/2ML nebulizer solution Commonly known as: PULMICORT Inhale into the lungs.   cloNIDine 0.2 mg/24hr patch Commonly known as: CATAPRES - Dosed in mg/24 hr Place 1 patch (0.2 mg total) onto the skin once a week.   clopidogrel 75 MG tablet Commonly known as: PLAVIX Take 1 tablet (75 mg total) by mouth daily.   cyclobenzaprine 5 MG tablet Commonly known as: FLEXERIL Take 1 tablet (5 mg total) by mouth 3 (three) times daily as needed for up to 3 days for muscle spasms.   Diclofenac Sodium 1 % Crea Apply 1 application topically 3 (three) times daily as needed.   docusate sodium 100 MG capsule Commonly known as: COLACE Take 100 mg by mouth daily as needed for mild constipation. Takes daily   doxazosin 8 MG tablet Commonly known as: CARDURA Take 1 tablet (8 mg total) by mouth daily.   erythromycin ophthalmic ointment     furosemide 20 MG tablet Commonly known as: LASIX Take 1 tablet (20 mg) by mouth once daily as needed for weight gain/ shortness of breath/ swelling   hydrALAZINE 100 MG tablet Commonly known as: APRESOLINE Take 100 mg by mouth 3 (three) times daily.   ipratropium-albuterol 0.5-2.5 (3) MG/3ML Soln Commonly known as: DUONEB Take 3 mLs by nebulization every 4 (four) hours as needed (shortness of breath/wheezing.).   levothyroxine 88 MCG tablet Commonly known as: SYNTHROID   lisinopril 2.5 MG tablet Commonly known as: ZESTRIL Take 1 tablet (2.5 mg total) by mouth daily. What changed:   medication strength  how much to take   montelukast 10 MG tablet Commonly known as: SINGULAIR TAKE 1 TABLET BY MOUTH EVERY DAY AT NIGHT   oxybutynin 5 MG 24 hr tablet Commonly known as: DITROPAN-XL Take 5 mg  by mouth daily.   pantoprazole 40 MG tablet Commonly known as: PROTONIX Take 20 mg by mouth daily.   simvastatin 40 MG tablet Commonly known as: ZOCOR Take 1 tablet (40 mg total) by mouth at bedtime.   Symbicort 160-4.5 MCG/ACT inhaler Generic drug: budesonide-formoterol Inhale 2 puffs into the lungs 2 (two) times daily.      DISCHARGE INSTRUCTIONS:   DIET:  Renal diet DISCHARGE CONDITION:  Stable ACTIVITY:  Activity as tolerated OXYGEN:  Home Oxygen: No.  Oxygen Delivery: room air DISCHARGE LOCATION:  home -palliative care to follow  If you experience worsening of your admission symptoms, develop shortness of breath, life threatening emergency, suicidal or homicidal thoughts you must seek medical attention immediately by calling 911 or calling your MD immediately  if symptoms less severe.  You Must read complete instructions/literature along with all the possible adverse reactions/side effects for all the Medicines you take and that have been prescribed to you. Take any new Medicines after you have completely understood and accpet all the possible adverse reactions/side  effects.   Please note  You were cared for by a hospitalist during your hospital stay. If you have any questions about your discharge medications or the care you received while you were in the hospital after you are discharged, you can call the unit and asked to speak with the hospitalist on call if the hospitalist that took care of you is not available. Once you are discharged, your primary care physician will handle any further medical issues. Please note that NO REFILLS for any discharge medications will be authorized once you are discharged, as it is imperative that you return to your primary care physician (or establish a relationship with a primary care physician if you do not have one) for your aftercare needs so that they can reassess your need for medications and monitor your lab values.    On the day of Discharge:  VITAL SIGNS:  Blood pressure (!) 157/43, pulse 66, temperature (!) 97.5 F (36.4 C), temperature source Oral, resp. rate 16, height 5\' 2"  (1.575 m), weight 75.8 kg, SpO2 96 %. PHYSICAL EXAMINATION:  GENERAL:  86 y.o.-year-old patient lying in the bed with no acute distress.  EYES: Pupils equal, round, reactive to light and accommodation. No scleral icterus. Extraocular muscles intact.  HEENT: Head atraumatic, normocephalic. Oropharynx and nasopharynx clear.  NECK:  Supple, no jugular venous distention. No thyroid enlargement, no tenderness.  LUNGS: Normal breath sounds bilaterally, no wheezing, rales,rhonchi or crepitation. No use of accessory muscles of respiration.  CARDIOVASCULAR: S1, S2 normal. No murmurs, rubs, or gallops.  ABDOMEN: Soft, non-tender, non-distended. Bowel sounds present. No organomegaly or mass.  EXTREMITIES: No pedal edema, cyanosis, or clubbing.  NEUROLOGIC: Cranial nerves II through XII are intact. Muscle strength 5/5 in all extremities. Sensation intact. Gait not checked.  PSYCHIATRIC: The patient is alert and oriented x 3.  SKIN: No obvious rash,  lesion, or ulcer.  DATA REVIEW:   CBC Recent Labs  Lab 08/07/19 0611  WBC 8.7  HGB 8.4*  HCT 25.8*  PLT 166    Chemistries  Recent Labs  Lab 08/06/19 0928 08/06/19 1249 08/06/19 1626  NA 138  --   --   K 3.8  --   --   CL 101  --   --   CO2 22  --   --   GLUCOSE 119*  --   --   BUN 52*  --   --   CREATININE 2.79*  --   --  CALCIUM 8.4*  --   --   MG  --   --  1.8  AST  --  17  --   ALT  --  13  --   ALKPHOS  --  66  --   BILITOT  --  0.9  --      Outpatient follow-up  Follow-up Information    [name], MD. Schedule an appointment as soon as possible for a visit in 1 week(s).   Specialty: Internal Medicine Contact information: [place] [address] [phone]        Minna Merritts, MD. Schedule an appointment as soon as possible for a visit in 2 week(s).   Specialty: Cardiology Contact information: [place]  [address] [phone]        Murlean Iba, MD. Schedule an appointment as soon as possible for a visit in 3 week(s).   Specialty: Nephrology Contact information: [place] [address] [phone]                Management plans discussed with the patient, family and they are in agreement.  CODE STATUS: Prior   TOTAL TIME TAKING CARE OF THIS PATIENT: 45 minutes.    Max Sane M.D on 08/08/2019 at 4:33 PM  [facility]   CC: Primary care physician; [name], MD   Note: This dictation was prepared with Dragon dictation along with smaller phrase technology. Any transcriptional errors that result from this process are unintentional.

## 2019-08-12 ENCOUNTER — Other Ambulatory Visit: Payer: Self-pay

## 2019-08-12 ENCOUNTER — Inpatient Hospital Stay
Admit: 2019-08-12 | Discharge: 2019-08-15 | DRG: 314 | Disposition: A | Discharge status: Home Health | Payer: Medicare Other | Attending: Internal Medicine | Admitting: Internal Medicine

## 2019-08-12 ENCOUNTER — Emergency Department: Payer: Medicare Other

## 2019-08-12 ENCOUNTER — Observation Stay: Payer: Medicare Other

## 2019-08-12 DIAGNOSIS — Z888 Allergy status to other drugs, medicaments and biological substances status: Secondary | ICD-10-CM

## 2019-08-12 DIAGNOSIS — B029 Zoster without complications: Secondary | ICD-10-CM | POA: Diagnosis present

## 2019-08-12 DIAGNOSIS — N184 Chronic kidney disease, stage 4 (severe): Secondary | ICD-10-CM | POA: Diagnosis present

## 2019-08-12 DIAGNOSIS — N186 End stage renal disease: Secondary | ICD-10-CM | POA: Diagnosis present

## 2019-08-12 DIAGNOSIS — Z7951 Long term (current) use of inhaled steroids: Secondary | ICD-10-CM

## 2019-08-12 DIAGNOSIS — M549 Dorsalgia, unspecified: Secondary | ICD-10-CM | POA: Diagnosis present

## 2019-08-12 DIAGNOSIS — I2510 Atherosclerotic heart disease of native coronary artery without angina pectoris: Secondary | ICD-10-CM | POA: Diagnosis present

## 2019-08-12 DIAGNOSIS — K219 Gastro-esophageal reflux disease without esophagitis: Secondary | ICD-10-CM | POA: Diagnosis present

## 2019-08-12 DIAGNOSIS — R55 Syncope and collapse: Secondary | ICD-10-CM | POA: Diagnosis present

## 2019-08-12 DIAGNOSIS — E039 Hypothyroidism, unspecified: Secondary | ICD-10-CM | POA: Diagnosis present

## 2019-08-12 DIAGNOSIS — Z20822 Contact with and (suspected) exposure to covid-19: Secondary | ICD-10-CM | POA: Diagnosis present

## 2019-08-12 DIAGNOSIS — G9341 Metabolic encephalopathy: Secondary | ICD-10-CM | POA: Diagnosis present

## 2019-08-12 DIAGNOSIS — E1122 Type 2 diabetes mellitus with diabetic chronic kidney disease: Secondary | ICD-10-CM | POA: Diagnosis present

## 2019-08-12 DIAGNOSIS — Z79899 Other long term (current) drug therapy: Secondary | ICD-10-CM

## 2019-08-12 DIAGNOSIS — I10 Essential (primary) hypertension: Secondary | ICD-10-CM | POA: Diagnosis present

## 2019-08-12 DIAGNOSIS — I6523 Occlusion and stenosis of bilateral carotid arteries: Secondary | ICD-10-CM | POA: Diagnosis present

## 2019-08-12 DIAGNOSIS — E785 Hyperlipidemia, unspecified: Secondary | ICD-10-CM | POA: Diagnosis present

## 2019-08-12 DIAGNOSIS — Z955 Presence of coronary angioplasty implant and graft: Secondary | ICD-10-CM

## 2019-08-12 DIAGNOSIS — R4189 Other symptoms and signs involving cognitive functions and awareness: Secondary | ICD-10-CM | POA: Diagnosis not present

## 2019-08-12 DIAGNOSIS — Z7902 Long term (current) use of antithrombotics/antiplatelets: Secondary | ICD-10-CM

## 2019-08-12 DIAGNOSIS — Z803 Family history of malignant neoplasm of breast: Secondary | ICD-10-CM

## 2019-08-12 DIAGNOSIS — R1031 Right lower quadrant pain: Secondary | ICD-10-CM | POA: Insufficient documentation

## 2019-08-12 DIAGNOSIS — Z515 Encounter for palliative care: Secondary | ICD-10-CM | POA: Diagnosis present

## 2019-08-12 DIAGNOSIS — I959 Hypotension, unspecified: Secondary | ICD-10-CM | POA: Diagnosis not present

## 2019-08-12 DIAGNOSIS — Z66 Do not resuscitate: Secondary | ICD-10-CM | POA: Diagnosis present

## 2019-08-12 DIAGNOSIS — I779 Disorder of arteries and arterioles, unspecified: Secondary | ICD-10-CM

## 2019-08-12 DIAGNOSIS — Z886 Allergy status to analgesic agent status: Secondary | ICD-10-CM

## 2019-08-12 DIAGNOSIS — E875 Hyperkalemia: Secondary | ICD-10-CM | POA: Diagnosis present

## 2019-08-12 DIAGNOSIS — I4510 Unspecified right bundle-branch block: Secondary | ICD-10-CM | POA: Diagnosis present

## 2019-08-12 DIAGNOSIS — R32 Unspecified urinary incontinence: Secondary | ICD-10-CM | POA: Diagnosis present

## 2019-08-12 DIAGNOSIS — I5032 Chronic diastolic (congestive) heart failure: Secondary | ICD-10-CM | POA: Diagnosis present

## 2019-08-12 DIAGNOSIS — G9340 Encephalopathy, unspecified: Secondary | ICD-10-CM

## 2019-08-12 DIAGNOSIS — Z885 Allergy status to narcotic agent status: Secondary | ICD-10-CM

## 2019-08-12 DIAGNOSIS — I132 Hypertensive heart and chronic kidney disease with heart failure and with stage 5 chronic kidney disease, or end stage renal disease: Secondary | ICD-10-CM | POA: Diagnosis present

## 2019-08-12 DIAGNOSIS — N185 Chronic kidney disease, stage 5: Secondary | ICD-10-CM

## 2019-08-12 DIAGNOSIS — M79604 Pain in right leg: Secondary | ICD-10-CM | POA: Diagnosis present

## 2019-08-12 DIAGNOSIS — Z96653 Presence of artificial knee joint, bilateral: Secondary | ICD-10-CM | POA: Diagnosis present

## 2019-08-12 DIAGNOSIS — Z882 Allergy status to sulfonamides status: Secondary | ICD-10-CM

## 2019-08-12 DIAGNOSIS — Z79890 Hormone replacement therapy (postmenopausal): Secondary | ICD-10-CM

## 2019-08-12 LAB — COMPREHENSIVE METABOLIC PANEL
ALT: 11 U/L (ref 0–44)
AST: 14 U/L — ABNORMAL LOW (ref 15–41)
Albumin: 2.9 g/dL — ABNORMAL LOW (ref 3.5–5.0)
Alkaline Phosphatase: 58 U/L (ref 38–126)
Anion gap: 10 (ref 5–15)
BUN: 78 mg/dL — ABNORMAL HIGH (ref 8–23)
CO2: 23 mmol/L (ref 22–32)
Calcium: 8.6 mg/dL — ABNORMAL LOW (ref 8.9–10.3)
Chloride: 100 mmol/L (ref 98–111)
Creatinine, Ser: 2.99 mg/dL — ABNORMAL HIGH (ref 0.44–1.00)
GFR calc Af Amer: 15 mL/min — ABNORMAL LOW (ref >60)
GFR calc non Af Amer: 13 mL/min — ABNORMAL LOW (ref >60)
Glucose, Bld: 148 mg/dL — ABNORMAL HIGH (ref 70–99)
Potassium: 6 mmol/L — ABNORMAL HIGH (ref 3.5–5.1)
Sodium: 133 mmol/L — ABNORMAL LOW (ref 135–145)
Total Bilirubin: 0.7 mg/dL (ref 0.3–1.2)
Total Protein: 5.2 g/dL — ABNORMAL LOW (ref 6.5–8.1)

## 2019-08-12 LAB — CBC WITH DIFFERENTIAL/PLATELET
Abs Immature Granulocytes: 0.04 10*3/uL (ref 0.00–0.07)
Basophils Absolute: 0 10*3/uL (ref 0.0–0.1)
Basophils Relative: 0 %
Eosinophils Absolute: 0.1 10*3/uL (ref 0.0–0.5)
Eosinophils Relative: 1 %
HCT: 30.6 % — ABNORMAL LOW (ref 36.0–46.0)
Hemoglobin: 10.2 g/dL — ABNORMAL LOW (ref 12.0–15.0)
Immature Granulocytes: 1 %
Lymphocytes Relative: 13 %
Lymphs Abs: 1.1 10*3/uL (ref 0.7–4.0)
MCH: 31.5 pg (ref 26.0–34.0)
MCHC: 33.3 g/dL (ref 30.0–36.0)
MCV: 94.4 fL (ref 80.0–100.0)
Monocytes Absolute: 0.8 10*3/uL (ref 0.1–1.0)
Monocytes Relative: 9 %
Neutro Abs: 6.8 10*3/uL (ref 1.7–7.7)
Neutrophils Relative %: 76 %
Platelets: 157 10*3/uL (ref 150–400)
RBC: 3.24 MIL/uL — ABNORMAL LOW (ref 3.87–5.11)
RDW: 13.9 % (ref 11.5–15.5)
WBC: 8.9 10*3/uL (ref 4.0–10.5)
nRBC: 0 % (ref 0.0–0.2)

## 2019-08-12 LAB — TROPONIN I (HIGH SENSITIVITY): Troponin I (High Sensitivity): 17 ng/L (ref <18)

## 2019-08-12 LAB — SARS CORONAVIRUS 2 BY RT PCR (HOSPITAL ORDER, PERFORMED IN ~~LOC~~ HOSPITAL LAB): SARS Coronavirus 2: NEGATIVE

## 2019-08-12 LAB — GLUCOSE, CAPILLARY: Glucose-Capillary: 140 mg/dL — ABNORMAL HIGH (ref 70–99)

## 2019-08-12 LAB — BRAIN NATRIURETIC PEPTIDE: B Natriuretic Peptide: 58.8 pg/mL (ref 0.0–100.0)

## 2019-08-12 LAB — LACTIC ACID, PLASMA: Lactic Acid, Venous: 1.4 mmol/L (ref 0.5–1.9)

## 2019-08-12 MED ORDER — DOCUSATE SODIUM 100 MG PO CAPS
100.0000 mg | ORAL_CAPSULE | Freq: Every day | ORAL | Status: DC | PRN
  Administered 2019-08-14 – 2019-08-15 (×2): 100 mg via ORAL
  Filled 2019-08-12 (×2): qty 1

## 2019-08-12 MED ORDER — HEPARIN SODIUM (PORCINE) 5000 UNIT/ML IJ SOLN
5000.0000 [IU] | Freq: Three times a day (TID) | INTRAMUSCULAR | Status: DC
  Administered 2019-08-12 – 2019-08-15 (×8): 5000 [IU] via SUBCUTANEOUS
  Filled 2019-08-12 (×8): qty 1

## 2019-08-12 MED ORDER — PREDNISONE 20 MG PO TABS
20.0000 mg | ORAL_TABLET | Freq: Two times a day (BID) | ORAL | Status: DC
  Administered 2019-08-12 – 2019-08-15 (×6): 20 mg via ORAL
  Filled 2019-08-12 (×6): qty 1

## 2019-08-12 MED ORDER — MOMETASONE FURO-FORMOTEROL FUM 200-5 MCG/ACT IN AERO
2.0000 | INHALATION_SPRAY | Freq: Two times a day (BID) | RESPIRATORY_TRACT | Status: DC
  Administered 2019-08-12 – 2019-08-15 (×6): 2 via RESPIRATORY_TRACT
  Filled 2019-08-12: qty 8.8

## 2019-08-12 MED ORDER — PANTOPRAZOLE SODIUM 20 MG PO TBEC
20.0000 mg | DELAYED_RELEASE_TABLET | Freq: Every day | ORAL | Status: DC
  Administered 2019-08-13 – 2019-08-15 (×3): 20 mg via ORAL
  Filled 2019-08-12 (×3): qty 1

## 2019-08-12 MED ORDER — SODIUM CHLORIDE 0.9 % IV BOLUS
250.0000 mL | Freq: Once | INTRAVENOUS | Status: AC
  Administered 2019-08-12: 250 mL via INTRAVENOUS

## 2019-08-12 MED ORDER — INSULIN ASPART 100 UNIT/ML IV SOLN
5.0000 [IU] | Freq: Once | INTRAVENOUS | Status: AC
  Administered 2019-08-12: 5 [IU] via INTRAVENOUS
  Filled 2019-08-12: qty 0.05

## 2019-08-12 MED ORDER — CLOPIDOGREL BISULFATE 75 MG PO TABS
75.0000 mg | ORAL_TABLET | Freq: Every day | ORAL | Status: DC
  Administered 2019-08-13 – 2019-08-15 (×3): 75 mg via ORAL
  Filled 2019-08-12 (×3): qty 1

## 2019-08-12 MED ORDER — ONDANSETRON HCL 4 MG/2ML IJ SOLN: 4.0000 mg | Freq: Three times a day (TID) | INTRAMUSCULAR | Status: DC | PRN

## 2019-08-12 MED ORDER — NALOXONE HCL 2 MG/2ML IJ SOSY
PREFILLED_SYRINGE | INTRAMUSCULAR | Status: AC
  Filled 2019-08-12: qty 2

## 2019-08-12 MED ORDER — OXYBUTYNIN CHLORIDE ER 5 MG PO TB24
5.0000 mg | ORAL_TABLET | Freq: Every day | ORAL | Status: DC
  Administered 2019-08-13 – 2019-08-15 (×3): 5 mg via ORAL
  Filled 2019-08-12 (×4): qty 1

## 2019-08-12 MED ORDER — DEXTROSE 50 % IV SOLN
50.0000 mL | Freq: Once | INTRAVENOUS | Status: AC
  Administered 2019-08-12: 50 mL via INTRAVENOUS
  Filled 2019-08-12: qty 50

## 2019-08-12 MED ORDER — CALCIUM GLUCONATE-NACL 1-0.675 GM/50ML-% IV SOLN
1.0000 g | Freq: Once | INTRAVENOUS | Status: AC
  Administered 2019-08-12: 1000 mg via INTRAVENOUS
  Filled 2019-08-12: qty 50

## 2019-08-12 MED ORDER — ALBUTEROL SULFATE (2.5 MG/3ML) 0.083% IN NEBU: 2.5000 mg | INHALATION_SOLUTION | RESPIRATORY_TRACT | Status: DC | PRN

## 2019-08-12 MED ORDER — ACETAMINOPHEN 325 MG PO TABS
650.0000 mg | ORAL_TABLET | Freq: Four times a day (QID) | ORAL | Status: DC | PRN
  Administered 2019-08-13 – 2019-08-14 (×2): 650 mg via ORAL
  Filled 2019-08-12 (×2): qty 2

## 2019-08-12 MED ORDER — MONTELUKAST SODIUM 10 MG PO TABS
10.0000 mg | ORAL_TABLET | Freq: Every day | ORAL | Status: DC
  Administered 2019-08-13 – 2019-08-14 (×2): 10 mg via ORAL
  Filled 2019-08-12 (×2): qty 1

## 2019-08-12 MED ORDER — OXYCODONE-ACETAMINOPHEN 5-325 MG PO TABS: 1.0000 | ORAL_TABLET | Freq: Three times a day (TID) | ORAL | Status: DC | PRN

## 2019-08-12 MED ORDER — ALPRAZOLAM 0.25 MG PO TABS: 0.2500 mg | ORAL_TABLET | Freq: Two times a day (BID) | ORAL | Status: DC | PRN

## 2019-08-12 MED ORDER — VALACYCLOVIR HCL 500 MG PO TABS
1000.0000 mg | ORAL_TABLET | Freq: Two times a day (BID) | ORAL | Status: DC
  Administered 2019-08-12: 1000 mg via ORAL
  Filled 2019-08-12 (×2): qty 2

## 2019-08-12 MED ORDER — FENTANYL CITRATE (PF) 100 MCG/2ML IJ SOLN
25.0000 ug | INTRAMUSCULAR | Status: DC | PRN
  Administered 2019-08-12: 25 ug via INTRAVENOUS
  Filled 2019-08-12: qty 2

## 2019-08-12 MED ORDER — IPRATROPIUM-ALBUTEROL 0.5-2.5 (3) MG/3ML IN SOLN: 3.0000 mL | RESPIRATORY_TRACT | Status: DC | PRN

## 2019-08-12 MED ORDER — ERYTHROMYCIN 5 MG/GM OP OINT
TOPICAL_OINTMENT | Freq: Every day | OPHTHALMIC | Status: DC
  Administered 2019-08-12 – 2019-08-14 (×3): 1 via OPHTHALMIC
  Filled 2019-08-12: qty 1

## 2019-08-12 MED ORDER — LEVOTHYROXINE SODIUM 88 MCG PO TABS
88.0000 ug | ORAL_TABLET | Freq: Every day | ORAL | Status: DC
  Administered 2019-08-13 – 2019-08-15 (×3): 88 ug via ORAL
  Filled 2019-08-12 (×4): qty 1

## 2019-08-12 MED ORDER — BUDESONIDE 0.25 MG/2ML IN SUSP
0.5000 mg | Freq: Every day | RESPIRATORY_TRACT | Status: DC
  Administered 2019-08-13: 0.25 mg via RESPIRATORY_TRACT
  Administered 2019-08-14 – 2019-08-15 (×2): 0.5 mg via RESPIRATORY_TRACT
  Filled 2019-08-12 (×4): qty 4

## 2019-08-12 MED ORDER — SIMVASTATIN 20 MG PO TABS
40.0000 mg | ORAL_TABLET | Freq: Every day | ORAL | Status: DC
  Administered 2019-08-12 – 2019-08-14 (×3): 40 mg via ORAL
  Filled 2019-08-12 (×2): qty 2
  Filled 2019-08-12: qty 4

## 2019-08-12 MED ORDER — PATIROMER SORBITEX CALCIUM 8.4 G PO PACK
16.8000 g | PACK | Freq: Every day | ORAL | Status: DC
  Administered 2019-08-12 – 2019-08-13 (×2): 16.8 g via ORAL
  Filled 2019-08-12 (×4): qty 2

## 2019-08-12 NOTE — ED Notes (Signed)
Family reports pt recently treated for shingles, rash noted to right side chest.

## 2019-08-12 NOTE — ED Notes (Signed)
Pt and daughter requesting pain medication for headache/neck pain. Pt answering questions at this time, continues to appear lethargic. RR even and unlabored,.

## 2019-08-12 NOTE — ED Provider Notes (Signed)
[facility] Emergency Department Provider Note    None    (approximate)  I have reviewed the triage vital signs and the nursing notes.   HISTORY  Chief Complaint unresponsive  Level V caveat:  AMS encephalopathy  HPI Amy [name] is a 86 y.o. female extensive past medical history as listed below presents to the ER for altered mental status.  Patient has been seen in outpatient clinic for routine labs after Amy [name] was recently discharged from the hospital with end-stage renal disease not on dialysis as well as recently diagnosed with shingles being placed on oxycodone, gabapentin as well as valacyclovir.  Family reports that over the past 24 hours has noted decline becoming increasingly confused decreased p.o. intake and decreased mobility.  While getting labs is apparently very drowsy mentally responsive but still with spontaneous respirations was brought to the ER for further evaluation.  States that Amy [name] has a DNR/DNI.   Past Medical History:  Diagnosis Date  . Bladder incontinence   . CAD (coronary artery disease)    a. 05/2009 Cath: LAD 90p (Xience 2.75 x 12 mm DES), 73m, D1 40, LCx 40p/m, RCA 30/40/30p/m, RCA 30/25d;  b.  12/2011 Lexiscan MV: no ischemia, breast attenuation artifact, normal EF-->Low risk; c. 10/2016 MV: fixed apical defect, most likely apical thinning and attenuation, No ischemia, EF 60%.  . Cancer ([place])    ovarian  . Carotid arterial disease w/ R Carotid Bruit (HCC)    a. 08/2016 Carotid U/S: 40-59% bilat ICA stenosis - f/u 1 yr.  . Chronic diastolic (congestive) heart failure ([place])    a. Echo 09/2015: EF 55-60% w/ Grade 1 DD, sev Ca2+ MV annulus, mildly dil LA; b. 09/2016 Echo: EF 65-70%, Gr2 DD, mildly dil LA/RA, nl RV fxn.  . Chronic Dyspnea on exertion   . CKD (chronic kidney disease) stage 3, GFR 30-59 ml/min   . Degenerative arthritis of knee    bilateral knees  . Diabetes mellitus    Type II  . GIB (gastrointestinal bleeding)     a. 08/2016 Admission w/ presyncope/anemia/melena-->req Transfusion-->endo ok, colonoscopy w/ polyps but no source of bleeding (most likely diverticular).  . Hiatal hernia   . Hypertension   . Iron deficiency   . Menopausal symptoms   . Morbid obesity ([place])   . Renal insufficiency   . Thyroid disease    hypothyroidism   Family History  Problem Relation Age of Onset  . Breast cancer Mother 39   Past Surgical History:  Procedure Laterality Date  . COLONOSCOPY  2015  . COLONOSCOPY WITH PROPOFOL N/A 09/25/2016   Procedure: COLONOSCOPY WITH PROPOFOL;  Surgeon: [name], MD;  Location: [facility] ENDOSCOPY;  Service: Gastroenterology;  Laterality: N/A;  . COLONOSCOPY WITH PROPOFOL N/A 09/26/2016   Procedure: COLONOSCOPY WITH PROPOFOL;  Surgeon: [name], MD;  Location: [facility] ENDOSCOPY;  Service: Gastroenterology;  Laterality: N/A;  . ESOPHAGOGASTRODUODENOSCOPY (EGD) WITH PROPOFOL N/A 09/25/2016   Procedure: ESOPHAGOGASTRODUODENOSCOPY (EGD) WITH PROPOFOL;  Surgeon: [name], MD;  Location: [facility] ENDOSCOPY;  Service: Gastroenterology;  Laterality: N/A;  . RENAL ANGIOGRAPHY N/A 02/07/2019   Procedure: RENAL ANGIOGRAPHY;  Surgeon: [name], MD;  Location: [facility];  Service: Cardiovascular;  Laterality: N/A;  . REPLACEMENT TOTAL KNEE BILATERAL    . TOTAL VAGINAL HYSTERECTOMY     ovarian mass, not cancerous  . UPPER GI ENDOSCOPY  2015   Patient Active Problem List   Diagnosis Date Noted  . Unresponsiveness 08/12/2019  .  Hyperkalemia 08/12/2019  . HTN (hypertension) 08/12/2019  . GERD (gastroesophageal reflux disease) 08/12/2019  . Hypothyroidism 08/12/2019  . CAD (coronary artery disease) 08/12/2019  . Carotid arterial disease ([place])   . Hypotension   . Chest pain 08/06/2019  . Renovascular hypertension 03/08/2019  . Renal artery stenosis ([place]) 01/28/2019  . Multiple renal cysts 12/29/2018  . Renal stone 12/29/2018  . Chronic kidney disease, stage IV (severe) ([place])  11/23/2018  . Diabetes ([place]) 06/29/2017  . CAP (community acquired pneumonia) 05/16/2017  . GI bleed 09/21/2016  . Vitamin D deficiency 11/04/2015  . Multiple lung nodules   . Bronchitis with obstruction ([place])   . New onset atrial fibrillation ([place]) 10/04/2015  . Anemia 10/04/2015  . Palliative care encounter 06/05/2015  . Morbid obesity ([place]) 12/05/2014  . Angina pectoris associated with type 2 diabetes mellitus ([place]) 12/05/2014  . Type 2 diabetes mellitus with hyperglycemia ([place]) 08/22/2014  . Chronic diastolic CHF (congestive heart failure) ([place]) 09/06/2013  . Preop cardiovascular exam 06/03/2012  . Iron deficiency anemia 09/08/2011  . HLD (hyperlipidemia) 07/18/2009  . Coronary artery disease involving native coronary artery of native heart without angina pectoris 07/18/2009  . HYPOTHYROIDISM-IATROGENIC 07/12/2009  . Essential hypertension 07/12/2009      Prior to Admission medications   Medication Sig Start Date End Date Taking? Authorizing Provider  albuterol (PROVENTIL HFA;VENTOLIN HFA) 108 (90 Base) MCG/ACT inhaler Inhale 2 puffs into the lungs every 6 (six) hours as needed for wheezing or shortness of breath.   Yes [provider]  ALPRAZolam Duanne Moron) 0.25 MG tablet Take by mouth. 08/07/19  Yes [provider]  budesonide (PULMICORT) 0.5 MG/2ML nebulizer solution Inhale into the lungs. 03/16/19 03/15/20 Yes [provider]  cloNIDine (CATAPRES - DOSED IN MG/24 HR) 0.2 mg/24hr patch Place 1 patch (0.2 mg total) onto the skin once a week. 06/29/17  Yes Darylene Price A, FNP  clopidogrel (PLAVIX) 75 MG tablet Take 1 tablet (75 mg total) by mouth daily. 09/27/16  Yes Dustin Flock, MD  Diclofenac Sodium 1 % CREA Apply 1 application topically 3 (three) times daily as needed. 08/07/19  Yes Max Sane, MD  docusate sodium (COLACE) 100 MG capsule Take 100 mg by mouth daily as needed for mild constipation. Takes daily   Yes [provider]  erythromycin  ophthalmic ointment  06/14/18  Yes [provider]  furosemide (LASIX) 20 MG tablet Take 1 tablet (20 mg) by mouth once daily as needed for weight gain/ shortness of breath/ swelling 04/05/18  Yes Gollan, Kathlene November, MD  gabapentin (NEURONTIN) 100 MG capsule Take 100 mg by mouth 3 (three) times daily. 08/10/19  Yes [provider]  ipratropium-albuterol (DUONEB) 0.5-2.5 (3) MG/3ML SOLN Take 3 mLs by nebulization every 4 (four) hours as needed (shortness of breath/wheezing.). 10/11/15  Yes Henreitta Leber, MD  levothyroxine (SYNTHROID) 88 MCG tablet  06/20/19  Yes [provider]  lisinopril (ZESTRIL) 2.5 MG tablet Take 1 tablet (2.5 mg total) by mouth daily. 08/07/19 09/06/19 Yes Max Sane, MD  montelukast (SINGULAIR) 10 MG tablet TAKE 1 TABLET BY MOUTH EVERY DAY AT NIGHT 03/24/19  Yes [provider]  oxybutynin (DITROPAN-XL) 5 MG 24 hr tablet Take 5 mg by mouth daily.    Yes [provider]  oxyCODONE-acetaminophen (PERCOCET/ROXICET) 5-325 MG tablet Take by mouth. 08/10/19  Yes [provider]  pantoprazole (PROTONIX) 40 MG tablet Take 20 mg by mouth daily.    Yes [provider]  predniSONE (DELTASONE) 20  MG tablet Take 20 mg by mouth 2 (two) times daily. 08/12/19  Yes [provider]  simvastatin (ZOCOR) 40 MG tablet Take 1 tablet (40 mg total) by mouth at bedtime. 03/22/18  Yes Minna Merritts, MD  SYMBICORT 160-4.5 MCG/ACT inhaler Inhale 2 puffs into the lungs 2 (two) times daily.  11/15/14  Yes [provider]  valACYclovir (VALTREX) 1000 MG tablet Take 1,000 mg by mouth 2 (two) times daily. 08/10/19  Yes [provider]  doxazosin (CARDURA) 8 MG tablet Take 1 tablet (8 mg total) by mouth daily. 03/15/19   Minna Merritts, MD  hydrALAZINE (APRESOLINE) 100 MG tablet Take 100 mg by mouth 3 (three) times daily. 05/21/19   [provider]    Allergies Losartan, Morphine and related, Atenolol, Codeine,  Nsaids, Rofecoxib, Sucralfate, and Sulfa antibiotics    Social History Social History   Tobacco Use  . Smoking status: Never Smoker  . Smokeless tobacco: Never Used  Vaping Use  . Vaping Use: Never used  Substance Use Topics  . Alcohol use: No  . Drug use: No    Review of Systems Patient denies headaches, rhinorrhea, blurry vision, numbness, shortness of breath, chest pain, edema, cough, abdominal pain, nausea, vomiting, diarrhea, dysuria, fevers, rashes or hallucinations unless otherwise stated above in HPI. ____________________________________________   PHYSICAL EXAM:  VITAL SIGNS: Vitals:   08/12/19 1410 08/12/19 1540  BP: (!) 114/58 (!) 136/37  Pulse: 76   Resp: 17 (!) 22  Temp:    SpO2: 93%     Constitutional: ill appearing, drowsy, weak and unable to provide history Eyes: Conjunctivae are normal. Pupils 24mm equal and reactive Head: Atraumatic. Nose: No congestion/rhinnorhea. Mouth/Throat: Mucous membranes are dry.   Neck: No stridor. Painless ROM.  Cardiovascular: Normal rate, regular rhythm. Grossly normal heart sounds.  Good peripheral circulation. Respiratory: Normal respiratory effort.  No retractions. Lungs CTAB. Gastrointestinal: Soft and nontender. No distention. No abdominal bruits. No CVA tenderness. Genitourinary:  Musculoskeletal: No lower extremity tenderness nor edema.  No joint effusions. Neurologic:  Normal speech and language. No gross focal neurologic deficits are appreciated. No facial droop Skin:  Skin is warm, dry and intact. No rash noted. Psychiatric: Mood and affect are normal. Speech and behavior are normal.  ____________________________________________   LABS (all labs ordered are listed, but only abnormal results are displayed)  Results for orders placed or performed during the hospital encounter of 08/12/19 (from the past 24 hour(s))  CBC with Differential/Platelet     Status: Abnormal   Collection Time: 08/12/19  1:00 PM    Result Value Ref Range   WBC 8.9 4.0 - 10.5 K/uL   RBC 3.24 (L) 3.87 - 5.11 MIL/uL   Hemoglobin 10.2 (L) 12.0 - 15.0 g/dL   HCT 30.6 (L) 36 - 46 %   MCV 94.4 80.0 - 100.0 fL   MCH 31.5 26.0 - 34.0 pg   MCHC 33.3 30.0 - 36.0 g/dL   RDW 13.9 11.5 - 15.5 %   Platelets 157 150 - 400 K/uL   nRBC 0.0 0.0 - 0.2 %   Neutrophils Relative % 76 %   Neutro Abs 6.8 1.7 - 7.7 K/uL   Lymphocytes Relative 13 %   Lymphs Abs 1.1 0.7 - 4.0 K/uL   Monocytes Relative 9 %   Monocytes Absolute 0.8 0 - 1 K/uL   Eosinophils Relative 1 %   Eosinophils Absolute 0.1 0 - 0 K/uL   Basophils Relative 0 %   Basophils  Absolute 0.0 0 - 0 K/uL   Immature Granulocytes 1 %   Abs Immature Granulocytes 0.04 0.00 - 0.07 K/uL  Comprehensive metabolic panel     Status: Abnormal   Collection Time: 08/12/19  1:00 PM  Result Value Ref Range   Sodium 133 (L) 135 - 145 mmol/L   Potassium 6.0 (H) 3.5 - 5.1 mmol/L   Chloride 100 98 - 111 mmol/L   CO2 23 22 - 32 mmol/L   Glucose, Bld 148 (H) 70 - 99 mg/dL   BUN 78 (H) 8 - 23 mg/dL   Creatinine, Ser 2.99 (H) 0.44 - 1.00 mg/dL   Calcium 8.6 (L) 8.9 - 10.3 mg/dL   Total Protein 5.2 (L) 6.5 - 8.1 g/dL   Albumin 2.9 (L) 3.5 - 5.0 g/dL   AST 14 (L) 15 - 41 U/L   ALT 11 0 - 44 U/L   Alkaline Phosphatase 58 38 - 126 U/L   Total Bilirubin 0.7 0.3 - 1.2 mg/dL   GFR calc non Af Amer 13 (L) >60 mL/min   GFR calc Af Amer 15 (L) >60 mL/min   Anion gap 10 5 - 15  Troponin I (High Sensitivity)     Status: None   Collection Time: 08/12/19  1:00 PM  Result Value Ref Range   Troponin I (High Sensitivity) 17 <18 ng/L  Glucose, capillary     Status: Abnormal   Collection Time: 08/12/19  1:01 PM  Result Value Ref Range   Glucose-Capillary 140 (H) 70 - 99 mg/dL  Lactic acid, plasma     Status: None   Collection Time: 08/12/19  1:40 PM  Result Value Ref Range   Lactic Acid, Venous 1.4 0.5 - 1.9 mmol/L   ____________________________________________  EKG My review and personal  interpretation at Time: 13:02   Indication: hypotension  Rate: 70  Rhythm: sinus Axis: normal Other: rbbb, nonspecific st abn, no stemi ____________________________________________  RADIOLOGY  I personally reviewed all radiographic images ordered to evaluate for the above acute complaints and reviewed radiology reports and findings.  These findings were personally discussed with the patient.  Please see medical record for radiology report.  ____________________________________________   PROCEDURES  Procedure(s) performed:  .Critical Care Performed by: Merlyn Lot, MD Authorized by: Merlyn Lot, MD   Critical care provider statement:    Critical care time (minutes):  35   Critical care time was exclusive of:  Separately billable procedures and treating other patients   Critical care was necessary to treat or prevent imminent or life-threatening deterioration of the following conditions:  Renal failure   Critical care was time spent personally by me on the following activities:  Development of treatment plan with patient or surrogate, discussions with consultants, evaluation of patient's response to treatment, examination of patient, obtaining history from patient or surrogate, ordering and performing treatments and interventions, ordering and review of laboratory studies, ordering and review of radiographic studies, pulse oximetry, re-evaluation of patient's condition and review of old charts      Critical Care performed: yes ____________________________________________   INITIAL IMPRESSION / [place] / ED COURSE  Pertinent labs & imaging results that were available during my care of the patient were reviewed by me and considered in my medical decision making (see chart for details).   DDX: Dehydration, sepsis, pna, uti, hypoglycemia, cva, drug effect, withdrawal, encephalitis   Nakayla C Ferrone is a 86 y.o. who presents to the ED with symptoms as  described above.  Clinically  Amy [name] is afebrile is ill and frail appearing no hypoxia.  Does have soft blood pressures which will give IV fluid.  I do suspect component of dehydration with concomitant metabolic encephalopathy given Amy [name] end-stage renal disease on multiple medications that can cause sedation.  Clinical Course as of Aug 11 1548  Fri Aug 12, 2019  1358 [place] no fever.  Blood pressure improving after IV fluids.  Still waiting on remainder of blood work.   [PR]  40 Blood work does show evidence of stage IV versus stage V CKD with worsening BUN and creatinine now with acute hyperkalemia.  Patient's blood pressure stabilizing.  Do suspect Amy [name] encephalopathy is secondary to metabolic process at this point.  Amy [name] is still protecting Amy [name] airway.  Family reiterates goals of care is comfort but would agree to some medical treatment but does not want to be intubated or coded.   [PR]    Clinical Course User Index [PR] Merlyn Lot, MD    The patient was evaluated in Emergency Department today for the symptoms described in the history of present illness. He/Amy [name] was evaluated in the context of the global COVID-19 pandemic, which necessitated consideration that the patient might be at risk for infection with the SARS-CoV-2 virus that causes COVID-19. Institutional protocols and algorithms that pertain to the evaluation of patients at risk for COVID-19 are in a state of rapid change based on information released by regulatory bodies including the CDC and federal and state organizations. These policies and algorithms were followed during the patient's care in the ED.  As part of my medical decision making, I reviewed the following data within the [facility] notes reviewed and incorporated, Labs reviewed, notes from prior ED visits and [place] Controlled Substance Database   ____________________________________________   FINAL CLINICAL IMPRESSION(S) / ED DIAGNOSES  Final  diagnoses:  Acute encephalopathy  Stage 5 chronic kidney disease not on chronic dialysis ([place])      NEW MEDICATIONS STARTED DURING THIS VISIT:  New Prescriptions   No medications on file     Note:  This document was prepared using Dragon voice recognition software and may include unintentional dictation errors.    Merlyn Lot, MD 08/12/19 [phone]

## 2019-08-12 NOTE — ED Triage Notes (Signed)
Pt to ED with family, reports went to get bloodwork and became unresponsive. DNR reported.  Pt responding to painful stimuli

## 2019-08-12 NOTE — ED Notes (Signed)
cbg 140

## 2019-08-12 NOTE — H&P (Addendum)
History and Physical    Amy [name] UTM:[id number] DOB: [birth year] DOA: 08/12/2019  Referring MD/NP/PA:   PCP: [name], MD   Patient coming from:  The patient is coming from home.  At baseline, pt is independent for most of ADL.        Chief Complaint: Unresponsiveness, right leg pain,   HPI: Amy [name] is a 86 y.o. 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  Per patient's daughter, patient was seen in clinic waiting for lab work done, suddenly become unresponsive days, which lasted for about 1 to 2 minutes.  Patient does not have unilateral numbness or tingling his extremities.  No facial droop or slurred speech.  Patient has chronic shortness of breath which has not changed.  No chest pain, fever or chills.  Denies nausea, vomiting, diarrhea or abdominal pain.  No symptoms of UTI.  Patient complains of right leg pain, particularly in the right groin area, denies recent fall or injury.  Patient patient has shingles in her right back and chest area.  Patient is taking prednisone, Valtrex, gabapentin and Percocet.  She still has significant pain in her back.  Patient was initially hypotensive with blood pressure 85/43, which improved to 114/58 after giving 250 cc normal saline bolus in ED.  ED Course: pt was found to have WBC 8.9, lactic acid 1.4, troponin 17, negative COVID-19 PCR, potassium 6.0, worsening renal function, temperature normal, heart rate 76, RR 17, oxygen saturation 93 to 96% on room air.  Patient is placed on progressive bed for observation  Review of Systems:   General: no fevers, chills, no body weight gain, has fatigue HEENT: no blurry vision, hearing changes or sore throat Respiratory: has  dyspnea, no coughing, wheezing CV: no chest pain, no palpitations GI: no nausea, vomiting, abdominal pain, diarrhea, constipation GU: no dysuria, burning on urination, increased urinary frequency, hematuria  Ext: no leg edema Neuro: no unilateral weakness, numbness, or tingling, no vision change or hearing loss. Has unresponsiveness. Skin: has shingles. no skin tear. MSK: has right leg and groin pain Heme: No easy bruising.  Travel history: No recent long distant travel.  Allergy:  Allergies  Allergen Reactions  . Losartan Other (See Comments)    Hyperkalemia  . Morphine And Related Shortness Of Breath    Rash, difficulty breathing, nausea.   . Atenolol Other (See Comments)    Other reaction(s): Other (See Comments) Decreased heart rate Decreased heart rate  . Codeine     Rash, difficulty breathing, nausea.  . Nsaids Other (See Comments)    Other reaction(s): Unknown  . Rofecoxib     Other reaction(s): Unknown  . Sucralfate Other (See Comments)    Throat tightness  . Sulfa Antibiotics     Other reaction(s): Unknown    Past Medical History:  Diagnosis Date  . Bladder incontinence   . CAD (coronary artery disease)    a. 05/2009 Cath: LAD 90p (Xience 2.75 x 12 mm DES), 82m, D1 40, LCx 40p/m, RCA 30/40/30p/m, RCA 30/25d;  b.  12/2011 Lexiscan MV: no ischemia, breast attenuation artifact, normal EF-->Low risk; c. 10/2016 MV: fixed apical defect, most likely apical thinning and attenuation, No ischemia, EF 60%.  . Cancer ([place])    ovarian  . Carotid arterial disease w/ R Carotid Bruit (HCC)  a. 08/2016 Carotid U/S: 40-59% bilat ICA stenosis - f/u 1 yr.  . Chronic diastolic (congestive) heart failure ([place])    a. Echo 09/2015: EF 55-60% w/ Grade 1 DD, sev Ca2+ MV annulus, mildly dil LA; b. 09/2016 Echo: EF 65-70%, Gr2 DD, mildly dil LA/RA, nl RV fxn.  . Chronic Dyspnea on exertion   . CKD (chronic kidney disease) stage 3, GFR 30-59 ml/min   . Degenerative arthritis of knee     bilateral knees  . Diabetes mellitus    Type II  . GIB (gastrointestinal bleeding)    a. 08/2016 Admission w/ presyncope/anemia/melena-->req Transfusion-->endo ok, colonoscopy w/ polyps but no source of bleeding (most likely diverticular).  . Hiatal hernia   . Hypertension   . Iron deficiency   . Menopausal symptoms   . Morbid obesity ([place])   . Renal insufficiency   . Thyroid disease    hypothyroidism    Past Surgical History:  Procedure Laterality Date  . COLONOSCOPY  2015  . COLONOSCOPY WITH PROPOFOL N/A 09/25/2016   Procedure: COLONOSCOPY WITH PROPOFOL;  Surgeon: [name], MD;  Location: [facility] ENDOSCOPY;  Service: Gastroenterology;  Laterality: N/A;  . COLONOSCOPY WITH PROPOFOL N/A 09/26/2016   Procedure: COLONOSCOPY WITH PROPOFOL;  Surgeon: [name], MD;  Location: [facility] ENDOSCOPY;  Service: Gastroenterology;  Laterality: N/A;  . ESOPHAGOGASTRODUODENOSCOPY (EGD) WITH PROPOFOL N/A 09/25/2016   Procedure: ESOPHAGOGASTRODUODENOSCOPY (EGD) WITH PROPOFOL;  Surgeon: [name], MD;  Location: [facility] ENDOSCOPY;  Service: Gastroenterology;  Laterality: N/A;  . RENAL ANGIOGRAPHY N/A 02/07/2019   Procedure: RENAL ANGIOGRAPHY;  Surgeon: [name], MD;  Location: [place] CV LAB;  Service: Cardiovascular;  Laterality: N/A;  . REPLACEMENT TOTAL KNEE BILATERAL    . TOTAL VAGINAL HYSTERECTOMY     ovarian mass, not cancerous  . UPPER GI ENDOSCOPY  2015    Social History:  reports that she has never smoked. She has never used smokeless tobacco. She reports that she does not drink alcohol and does not use drugs.  Family History:  Family History  Problem Relation Age of Onset  . Breast cancer Mother 54     Prior to Admission medications   Medication Sig Start Date End Date Taking? Authorizing Provider  albuterol (PROVENTIL HFA;VENTOLIN HFA) 108 (90 Base) MCG/ACT inhaler Inhale 2 puffs into the lungs every 6 (six) hours as needed for wheezing or shortness of breath.   Yes [provider]  ALPRAZolam Duanne Moron) 0.25 MG tablet Take by mouth. 08/07/19  Yes [provider]  budesonide (PULMICORT) 0.5 MG/2ML nebulizer solution Inhale into the lungs. 03/16/19 03/15/20 Yes [provider]  clopidogrel (PLAVIX) 75 MG tablet Take 1 tablet (75 mg total) by mouth daily. 09/27/16  Yes Dustin Flock, MD  Diclofenac Sodium 1 % CREA Apply 1 application topically 3 (three) times daily as needed. 08/07/19  Yes Max Sane, MD  docusate sodium (COLACE) 100 MG capsule Take 100 mg by mouth daily as needed for mild constipation. Takes daily   Yes [provider]  doxazosin (CARDURA) 8 MG tablet Take 1 tablet (8 mg total) by mouth daily. 03/15/19  Yes Minna Merritts, MD  erythromycin ophthalmic ointment  06/14/18  Yes [provider]  furosemide (LASIX) 20 MG tablet Take 1 tablet (20 mg) by mouth once daily as needed for weight gain/ shortness of breath/ swelling 04/05/18  Yes Gollan, Kathlene November, MD  gabapentin (NEURONTIN) 100 MG capsule Take 100 mg by mouth 3 (three) times daily. 08/10/19  Yes  [provider]  hydrALAZINE (APRESOLINE) 100 MG tablet Take 100 mg by mouth 3 (three) times daily. 05/21/19  Yes [provider]  ipratropium-albuterol (DUONEB) 0.5-2.5 (3) MG/3ML SOLN Take 3 mLs by nebulization every 4 (four) hours as needed (shortness of breath/wheezing.). 10/11/15  Yes Henreitta Leber, MD  levothyroxine (SYNTHROID) 88 MCG tablet  06/20/19  Yes [provider]  lisinopril (ZESTRIL) 2.5 MG tablet Take 1 tablet (2.5 mg total) by mouth daily. 08/07/19 09/06/19 Yes Max Sane, MD  montelukast (SINGULAIR) 10 MG tablet TAKE 1 TABLET BY MOUTH EVERY DAY AT NIGHT 03/24/19  Yes [provider]  oxybutynin (DITROPAN-XL) 5 MG 24 hr tablet Take 5 mg by mouth daily.    Yes [provider]  oxyCODONE-acetaminophen (PERCOCET/ROXICET) 5-325 MG tablet Take by mouth. 08/10/19  Yes [provider]  pantoprazole (PROTONIX) 40  MG tablet Take 20 mg by mouth daily.    Yes [provider]  predniSONE (DELTASONE) 20 MG tablet Take 20 mg by mouth 2 (two) times daily. 08/12/19  Yes [provider]  simvastatin (ZOCOR) 40 MG tablet Take 1 tablet (40 mg total) by mouth at bedtime. 03/22/18  Yes Minna Merritts, MD  SYMBICORT 160-4.5 MCG/ACT inhaler Inhale 2 puffs into the lungs 2 (two) times daily.  11/15/14  Yes [provider]  valACYclovir (VALTREX) 1000 MG tablet Take 1,000 mg by mouth 2 (two) times daily. 08/10/19  Yes [provider]  cloNIDine (CATAPRES - DOSED IN MG/24 HR) 0.2 mg/24hr patch Place 1 patch (0.2 mg total) onto the skin once a week. 06/29/17   Alisa Graff, FNP    Physical Exam: Vitals:   08/12/19 1340 08/12/19 1355 08/12/19 1410 08/12/19 1540  BP: (!) 139/38  (!) 114/58 (!) 136/37  Pulse: 75  76   Resp: 16  17 (!) 22  Temp:  97.8 F (36.6 C)    TempSrc:  Oral    SpO2: 95%  93%   Weight:      Height:       General: Not in acute distress HEENT:       Eyes: PERRL, EOMI, no scleral icterus.       ENT: No discharge from the ears and nose, no pharynx injection, no tonsillar enlargement.        Neck: No JVD, no mass felt. Heme: No neck lymph node enlargement. Cardiac: S1/S2, RRR, No murmurs, No gallops or rubs. Respiratory: No rales, wheezing, rhonchi or rubs. GI: Soft, nondistended, nontender, no rebound pain, no organomegaly, BS present. GU: No hematuria Ext: No pitting leg edema bilaterally. 2+DP/PT pulse bilaterally. Musculoskeletal: No joint deformities, No joint redness or warmth, no limitation of ROM in spin. Skin: has shingles rash which are scabbed in right back and front chest area  neuro: Alert, oriented X3, cranial nerves II-XII grossly intact, moves all extremities normally.  Psych: Patient is not psychotic, no suicidal or hemocidal ideation.  Labs on Admission: I have personally reviewed following labs and imaging studies  CBC: Recent Labs    Lab 27-Aug-2019 0928 08/07/19 0611 08/12/19 1300  WBC 10.5 8.7 8.9  NEUTROABS  --   --  6.8  HGB 10.0* 8.4* 10.2*  HCT 30.0* 25.8* 30.6*  MCV 96.2 98.5 94.4  PLT 203 166 161   Basic Metabolic Panel: Recent Labs  Lab 2019-08-27 0928 2019-08-27 1626 08/12/19 1300  NA 138  --  133*  K 3.8  --  6.0*  CL 101  --  100  CO2 22  --  23  GLUCOSE 119*  --  148*  BUN 52*  --  78*  CREATININE 2.79*  --  2.99*  CALCIUM 8.4*  --  8.6*  MG  --  1.8  --    GFR: Estimated Creatinine Clearance: 11.9 mL/min (A) (by C-G formula based on SCr of 2.99 mg/dL (H)). Liver Function Tests: Recent Labs  Lab 08/06/19 1249 08/12/19 1300  AST 17 14*  ALT 13 11  ALKPHOS 66 58  BILITOT 0.9 0.7  PROT 5.6* 5.2*  ALBUMIN 3.2* 2.9*   Recent Labs  Lab 08/06/19 1249  LIPASE 21   No results for input(s): AMMONIA in the last 168 hours. Coagulation Profile: Recent Labs  Lab 08/06/19 1626  INR 1.2   Cardiac Enzymes: No results for input(s): CKTOTAL, CKMB, CKMBINDEX, TROPONINI in the last 168 hours. BNP (last 3 results) No results for input(s): PROBNP in the last 8760 hours. HbA1C: No results for input(s): HGBA1C in the last 72 hours. CBG: Recent Labs  Lab 08/12/19 1301  GLUCAP 140*   Lipid Profile: No results for input(s): CHOL, HDL, LDLCALC, TRIG, CHOLHDL, LDLDIRECT in the last 72 hours. Thyroid Function Tests: No results for input(s): TSH, T4TOTAL, FREET4, T3FREE, THYROIDAB in the last 72 hours. Anemia Panel: No results for input(s): VITAMINB12, FOLATE, FERRITIN, TIBC, IRON, RETICCTPCT in the last 72 hours. Urine analysis:    Component Value Date/Time   COLORURINE YELLOW (A) 09/21/2016 2255   APPEARANCEUR Cloudy (A) 03/15/2019 0909   LABSPEC 1.010 09/21/2016 2255   LABSPEC 1.025 10/11/2013 1637   PHURINE 6.0 09/21/2016 2255   GLUCOSEU Negative 03/15/2019 0909   GLUCOSEU Negative 10/11/2013 1637   HGBUR NEGATIVE 09/21/2016 2255   BILIRUBINUR Negative 03/15/2019 0909   BILIRUBINUR  Negative 10/11/2013 1637   KETONESUR NEGATIVE 09/21/2016 2255   PROTEINUR 1+ (A) 03/15/2019 0909   PROTEINUR NEGATIVE 09/21/2016 2255   NITRITE Negative 03/15/2019 0909   NITRITE NEGATIVE 09/21/2016 2255   LEUKOCYTESUR Negative 03/15/2019 0909   LEUKOCYTESUR Negative 10/11/2013 1637   Sepsis Labs: @LABRCNTIP (procalcitonin:4,lacticidven:4) ) Recent Results (from the past 240 hour(s))  SARS Coronavirus 2 by RT PCR (hospital order, performed in [place] hospital lab) Nasopharyngeal Nasopharyngeal Swab     Status: None   Collection Time: 08/06/19  4:26 PM   Specimen: Nasopharyngeal Swab  Result Value Ref Range Status   SARS Coronavirus 2 NEGATIVE NEGATIVE Final    Comment: (NOTE) SARS-CoV-2 target nucleic acids are NOT DETECTED.  The SARS-CoV-2 RNA is generally detectable in upper and lower respiratory specimens during the acute phase of infection. The lowest concentration of SARS-CoV-2 viral copies this assay can detect is 250 copies / mL. A negative result does not preclude SARS-CoV-2 infection and should not be used as the sole basis for treatment or other patient management decisions.  A negative result may occur with improper specimen collection / handling, submission of specimen other than nasopharyngeal swab, presence of viral mutation(s) within the areas targeted by this assay, and inadequate number of viral copies (<250 copies / mL). A negative result must be combined with clinical observations, patient history, and epidemiological information.  Fact Sheet for Patients:   StrictlyIdeas.no  Fact Sheet for Healthcare Providers: BankingDealers.co.za  This test is not yet approved or  cleared by the [place] FDA and has been authorized for detection and/or diagnosis of SARS-CoV-2 by FDA under an Emergency Use Authorization (EUA).  This EUA will remain in effect (meaning this test can be used)  for the duration of  the COVID-19 declaration under Section 564(b)(1) of the Act, 21 U.S.C. section 360bbb-3(b)(1), unless the authorization is terminated or revoked sooner.  Performed at [facility], [address] [place], [place] [address]   SARS Coronavirus 2 by RT PCR (hospital order, performed in [facility] hospital lab) Nasopharyngeal Nasopharyngeal Swab     Status: None   Collection Time: 08/12/19  2:39 PM   Specimen: Nasopharyngeal Swab  Result Value Ref Range Status   SARS Coronavirus 2 NEGATIVE NEGATIVE Final    Comment: (NOTE) SARS-CoV-2 target nucleic acids are NOT DETECTED.  The SARS-CoV-2 RNA is generally detectable in upper and lower respiratory specimens during the acute phase of infection. The lowest concentration of SARS-CoV-2 viral copies this assay can detect is 250 copies / mL. A negative result does not preclude SARS-CoV-2 infection and should not be used as the sole basis for treatment or other patient management decisions.  A negative result may occur with improper specimen collection / handling, submission of specimen other than nasopharyngeal swab, presence of viral mutation(s) within the areas targeted by this assay, and inadequate number of viral copies (<250 copies / mL). A negative result must be combined with clinical observations, patient history, and epidemiological information.  Fact Sheet for Patients:   StrictlyIdeas.no  Fact Sheet for Healthcare Providers: BankingDealers.co.za  This test is not yet approved or  cleared by the [place] FDA and has been authorized for detection and/or diagnosis of SARS-CoV-2 by FDA under an Emergency Use Authorization (EUA).  This EUA will remain in effect (meaning this test can be used) for the duration of the COVID-19 declaration under Section 564(b)(1) of the Act, 21 U.S.C. section 360bbb-3(b)(1), unless the authorization is terminated or revoked  sooner.  Performed at [facility], [address] [place], [place] [address]      Radiological Exams on Admission: DG HIP UNILAT WITH PELVIS 2-3 VIEWS RIGHT  Result Date: 08/12/2019 CLINICAL DATA:  Right groin pain EXAM: DG HIP (WITH OR WITHOUT PELVIS) 2-3V RIGHT COMPARISON:  None. FINDINGS: Degenerative changes of the right hip joint are noted. Pelvic ring is intact. No acute fracture or dislocation is noted. IMPRESSION: Significant degenerative change of the right hip. Electronically Signed   By: Inez Catalina M.D.   On: 08/12/2019 16:49     EKG: Independently reviewed.  Sinus rhythm, QTC 420, RAD, right bundle blockade which is old, early R wave progression  Assessment/Plan Principal Problem:   Unresponsiveness Active Problems:   HLD (hyperlipidemia)   Chronic diastolic CHF (congestive heart failure) (HCC)   Chronic kidney disease, stage IV (severe) (HCC)   Hyperkalemia   HTN (hypertension)   GERD (gastroesophageal reflux disease)   Hypothyroidism   CAD (coronary artery disease)   Carotid arterial disease (HCC)   Hypotension   Shingles   Unresponsiveness: Etiology is not clear.  Patient had hypotension, which may have contributed partially.  Other possibilities include arrhythmia given hyperkalemia, orthostatic status, vasovagal syncope.  No seizure activity noted.  No focal deficit on physical examination.  Currently patient is alert, oriented x3.  -Placed on progressive plan of observation -Frequent neuro check -Hold blood pressure medications -Follow-up CT head -Check orthostatic vital sign -Palliative consult  HLD (hyperlipidemia): -zocor  Chronic diastolic CHF (congestive heart failure) ([place]): 2D echo on 10/22/2016 showed EF of 65-70% with grade 2 diastolic dysfunction.  Patient does not have leg edema, no JVD.  CHF seem to be compensated. -Hold Lasix -Check BNP  Chronic kidney disease, stage IV -V: Recent baseline creatinine 1.7-2.4.  Renal function  has worsened, with creatinine 2.99, BUN 78.  Patient is not a good candidate for dialysis.  -Hold lisinopril, Lasix -Patient received 250 cc normal saline -Follow-up renal function by BMP  Hyperkalemia: K 6.0, no T wave peaking on EKG. -Patiromer 16.8 g -D50, 5 units of novolog and 1 gram of calcium gluconate  HTN (hypertension): -Hold all blood pressure medications due to hypotension  GERD (gastroesophageal reflux disease) -Protonix  Hypothyroidism -Synthroid  CAD (coronary artery disease): S/p of DES stent, no chest pain -Continue Zocor, Plavix  Carotid arterial disease (HCC) -Continue Plavix and Zocor  Hypotension: Likely due to dehydration and continuation of diuretics and multiple blood pressure medications.  No signs of infection, low suspicion for sepsis.  Blood pressure responded to small amount of normal saline IV fluid quickly.  Currently blood pressure is 114/58 -Patient received 250 cc normal saline.   -Hold all blood pressure medications  Shingles: -Continue prednisone, Valtrex, gabapentin -Decrease Percocet to every 8 hour as needed        DVT ppx: SQ Heparin   Code Status:  DNR per her daughter and pt. Family Communication: Yes, patient's daughter  at bed side Disposition Plan:  Anticipate discharge back to previous environment Consults called:  none Admission status: Med-surg bed for obs      Status is: Observation  The patient remains OBS appropriate and will d/c before 2 midnights.  Dispo: The patient is from: Home              Anticipated d/c is to: Home              Anticipated d/c date is: 1 day              Patient currently is not medically stable to d/c.          Date of Service 08/12/2019    Ivor Costa [facility]   If 7PM-7AM, please contact night-coverage [email or web address] 08/12/2019, 4:58 PM

## 2019-08-12 NOTE — ED Notes (Addendum)
Family reports pt has not had any pain medications/narcotics since 8pm last night, has not been eating well.  Pt answering minimum questions at this time, appears lethargic, breathing appears shallow.

## 2019-08-12 NOTE — ED Notes (Signed)
Pharmacy messaged to send veltassa medication

## 2019-08-13 DIAGNOSIS — Z79899 Other long term (current) drug therapy: Secondary | ICD-10-CM | POA: Diagnosis not present

## 2019-08-13 DIAGNOSIS — Z7189 Other specified counseling: Secondary | ICD-10-CM | POA: Diagnosis not present

## 2019-08-13 DIAGNOSIS — N186 End stage renal disease: Secondary | ICD-10-CM | POA: Diagnosis present

## 2019-08-13 DIAGNOSIS — Z7951 Long term (current) use of inhaled steroids: Secondary | ICD-10-CM | POA: Diagnosis not present

## 2019-08-13 DIAGNOSIS — M79604 Pain in right leg: Secondary | ICD-10-CM | POA: Diagnosis present

## 2019-08-13 DIAGNOSIS — E785 Hyperlipidemia, unspecified: Secondary | ICD-10-CM | POA: Diagnosis present

## 2019-08-13 DIAGNOSIS — E039 Hypothyroidism, unspecified: Secondary | ICD-10-CM | POA: Diagnosis present

## 2019-08-13 DIAGNOSIS — I6523 Occlusion and stenosis of bilateral carotid arteries: Secondary | ICD-10-CM | POA: Diagnosis present

## 2019-08-13 DIAGNOSIS — I132 Hypertensive heart and chronic kidney disease with heart failure and with stage 5 chronic kidney disease, or end stage renal disease: Secondary | ICD-10-CM | POA: Diagnosis present

## 2019-08-13 DIAGNOSIS — G9340 Encephalopathy, unspecified: Secondary | ICD-10-CM | POA: Diagnosis not present

## 2019-08-13 DIAGNOSIS — B029 Zoster without complications: Secondary | ICD-10-CM | POA: Diagnosis present

## 2019-08-13 DIAGNOSIS — R4189 Other symptoms and signs involving cognitive functions and awareness: Secondary | ICD-10-CM | POA: Diagnosis not present

## 2019-08-13 DIAGNOSIS — R55 Syncope and collapse: Secondary | ICD-10-CM | POA: Diagnosis present

## 2019-08-13 DIAGNOSIS — Z66 Do not resuscitate: Secondary | ICD-10-CM | POA: Diagnosis present

## 2019-08-13 DIAGNOSIS — M549 Dorsalgia, unspecified: Secondary | ICD-10-CM | POA: Diagnosis present

## 2019-08-13 DIAGNOSIS — Z886 Allergy status to analgesic agent status: Secondary | ICD-10-CM | POA: Diagnosis not present

## 2019-08-13 DIAGNOSIS — Z515 Encounter for palliative care: Secondary | ICD-10-CM | POA: Diagnosis present

## 2019-08-13 DIAGNOSIS — I4510 Unspecified right bundle-branch block: Secondary | ICD-10-CM | POA: Diagnosis present

## 2019-08-13 DIAGNOSIS — E1122 Type 2 diabetes mellitus with diabetic chronic kidney disease: Secondary | ICD-10-CM | POA: Diagnosis present

## 2019-08-13 DIAGNOSIS — I350 Nonrheumatic aortic (valve) stenosis: Secondary | ICD-10-CM | POA: Diagnosis not present

## 2019-08-13 DIAGNOSIS — I2510 Atherosclerotic heart disease of native coronary artery without angina pectoris: Secondary | ICD-10-CM

## 2019-08-13 DIAGNOSIS — Z885 Allergy status to narcotic agent status: Secondary | ICD-10-CM | POA: Diagnosis not present

## 2019-08-13 DIAGNOSIS — I5032 Chronic diastolic (congestive) heart failure: Secondary | ICD-10-CM | POA: Diagnosis present

## 2019-08-13 DIAGNOSIS — G9341 Metabolic encephalopathy: Secondary | ICD-10-CM | POA: Diagnosis present

## 2019-08-13 DIAGNOSIS — E875 Hyperkalemia: Secondary | ICD-10-CM | POA: Diagnosis present

## 2019-08-13 DIAGNOSIS — I340 Nonrheumatic mitral (valve) insufficiency: Secondary | ICD-10-CM | POA: Diagnosis not present

## 2019-08-13 DIAGNOSIS — I959 Hypotension, unspecified: Secondary | ICD-10-CM | POA: Diagnosis present

## 2019-08-13 DIAGNOSIS — R32 Unspecified urinary incontinence: Secondary | ICD-10-CM | POA: Diagnosis present

## 2019-08-13 DIAGNOSIS — K219 Gastro-esophageal reflux disease without esophagitis: Secondary | ICD-10-CM | POA: Diagnosis present

## 2019-08-13 DIAGNOSIS — Z20822 Contact with and (suspected) exposure to covid-19: Secondary | ICD-10-CM | POA: Diagnosis present

## 2019-08-13 LAB — BASIC METABOLIC PANEL
Anion gap: 6 (ref 5–15)
BUN: 74 mg/dL — ABNORMAL HIGH (ref 8–23)
CO2: 23 mmol/L (ref 22–32)
Calcium: 8.5 mg/dL — ABNORMAL LOW (ref 8.9–10.3)
Chloride: 104 mmol/L (ref 98–111)
Creatinine, Ser: 2.64 mg/dL — ABNORMAL HIGH (ref 0.44–1.00)
GFR calc Af Amer: 18 mL/min — ABNORMAL LOW (ref >60)
GFR calc non Af Amer: 15 mL/min — ABNORMAL LOW (ref >60)
Glucose, Bld: 153 mg/dL — ABNORMAL HIGH (ref 70–99)
Potassium: 5.2 mmol/L — ABNORMAL HIGH (ref 3.5–5.1)
Sodium: 133 mmol/L — ABNORMAL LOW (ref 135–145)

## 2019-08-13 LAB — CBC
HCT: 28.3 % — ABNORMAL LOW (ref 36.0–46.0)
Hemoglobin: 9.7 g/dL — ABNORMAL LOW (ref 12.0–15.0)
MCH: 31.9 pg (ref 26.0–34.0)
MCHC: 34.3 g/dL (ref 30.0–36.0)
MCV: 93.1 fL (ref 80.0–100.0)
Platelets: 149 10*3/uL — ABNORMAL LOW (ref 150–400)
RBC: 3.04 MIL/uL — ABNORMAL LOW (ref 3.87–5.11)
RDW: 13.6 % (ref 11.5–15.5)
WBC: 8.3 10*3/uL (ref 4.0–10.5)
nRBC: 0 % (ref 0.0–0.2)

## 2019-08-13 LAB — BLOOD GAS, ARTERIAL
Acid-base deficit: 1.8 mmol/L (ref 0.0–2.0)
Bicarbonate: 23.1 mmol/L (ref 20.0–28.0)
FIO2: 0.21
O2 Saturation: 95.3 %
Patient temperature: 37
pCO2 arterial: 39 mmHg (ref 32.0–48.0)
pH, Arterial: 7.38 (ref 7.350–7.450)
pO2, Arterial: 79 mmHg — ABNORMAL LOW (ref 83.0–108.0)

## 2019-08-13 LAB — URINALYSIS, COMPLETE (UACMP) WITH MICROSCOPIC
Bacteria, UA: NONE SEEN
Bilirubin Urine: NEGATIVE
Glucose, UA: NEGATIVE mg/dL
Hgb urine dipstick: NEGATIVE
Ketones, ur: NEGATIVE mg/dL
Leukocytes,Ua: NEGATIVE
Nitrite: NEGATIVE
Protein, ur: NEGATIVE mg/dL
Specific Gravity, Urine: 1.01 (ref 1.005–1.030)
pH: 6 (ref 5.0–8.0)

## 2019-08-13 LAB — AMMONIA: Ammonia: <9 umol/L — ABNORMAL LOW (ref 9–35)

## 2019-08-13 LAB — TSH: TSH: 1.962 u[IU]/mL (ref 0.350–4.500)

## 2019-08-13 MED ORDER — DOXAZOSIN MESYLATE 4 MG PO TABS
4.0000 mg | ORAL_TABLET | Freq: Every day | ORAL | Status: DC
  Administered 2019-08-13 – 2019-08-14 (×2): 4 mg via ORAL
  Filled 2019-08-13 (×2): qty 1

## 2019-08-13 MED ORDER — GABAPENTIN 100 MG PO CAPS
100.0000 mg | ORAL_CAPSULE | Freq: Two times a day (BID) | ORAL | Status: DC
  Filled 2019-08-13: qty 1

## 2019-08-13 MED ORDER — HYDRALAZINE HCL 20 MG/ML IJ SOLN: 10.0000 mg | Freq: Four times a day (QID) | INTRAMUSCULAR | Status: DC | PRN

## 2019-08-13 MED ORDER — FENTANYL CITRATE (PF) 100 MCG/2ML IJ SOLN: 12.5000 ug | INTRAMUSCULAR | Status: DC | PRN

## 2019-08-13 MED ORDER — CLONIDINE HCL 0.2 MG/24HR TD PTWK
0.2000 mg | MEDICATED_PATCH | TRANSDERMAL | Status: DC
  Administered 2019-08-13: 0.2 mg via TRANSDERMAL
  Filled 2019-08-13: qty 1

## 2019-08-13 MED ORDER — VALACYCLOVIR HCL 500 MG PO TABS
1000.0000 mg | ORAL_TABLET | Freq: Every day | ORAL | Status: DC
  Administered 2019-08-13 – 2019-08-14 (×2): 1000 mg via ORAL
  Filled 2019-08-13 (×2): qty 2

## 2019-08-13 MED ORDER — CALAMINE EX LOTN
TOPICAL_LOTION | Freq: Three times a day (TID) | CUTANEOUS | Status: DC
  Filled 2019-08-13 (×3): qty 180

## 2019-08-13 NOTE — Plan of Care (Signed)
  Problem: Health Behavior/Discharge Planning: Goal: Ability to manage health-related needs will improve Outcome: Progressing   Problem: Clinical Measurements: Goal: Will remain free from infection Outcome: Progressing   Problem: Activity: Goal: Risk for activity intolerance will decrease Outcome: Progressing   Problem: Pain Managment: Goal: General experience of comfort will improve Outcome: Progressing   Problem: Education: Goal: Knowledge of condition and prescribed therapy will improve Outcome: Progressing   Problem: Cardiac: Goal: Will achieve and/or maintain adequate cardiac output Outcome: Progressing   Problem: Physical Regulation: Goal: Complications related to the disease process, condition or treatment will be avoided or minimized Outcome: Progressing

## 2019-08-13 NOTE — Progress Notes (Signed)
PT Cancellation Note  Patient Details Name: Amy [name] MRN: [id number] DOB: 1928-02-20   Cancelled Treatment:    Reason Eval/Treat Not Completed: Medical issues which prohibited therapy Pt's chart reviewed and determined to have K+ level outside acceptable levels for activity (6.0). Will re-attempt at later date as appropriate.  Madilyn Hook 08/13/2019, 8:04 AM

## 2019-08-13 NOTE — Progress Notes (Addendum)
[facility]                                                                              Patient Demographics  Amy [name], is a 86 y.o. female, DOB - Jul 07, 1927, XNA:[id number]  Admit date - 08/12/2019   Admitting Physician Ivor Costa, MD  Outpatient Primary MD for the patient is [name], MD  Outpatient specialists:   LOS - 0  days   Medical records reviewed and are as summarized below:    Chief Complaint  Patient presents with  . unresponsive       Brief summary   Patient is a 86 y.o. 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 Per patient's daughter, patient was seen in clinic waiting for lab work done, suddenly become unresponsive days, which lasted for about 1 to 2 minutes.  Patient does not have unilateral numbness or tingling his extremities.  No facial droop or slurred speech.  Patient has chronic shortness of breath which has not changed.   Patient had developed shingles in her right back and chest area, has been taking prednisone, Valtrex, gabapentin and Percocet.  Patient is also also noticed some confusion with pain medication. Patient was initially hypotensive with blood pressure 85/43, which improved to 114/58 after giving 250 cc normal saline bolus in ED.  Assessment & Plan    Principal Problem:   Unresponsiveness/syncopal episode -Unclear etiology, possibly vasovagal however patient had hypotension on admission which could have contributed -Other possibilities include arrhythmia given hyperkalemia, vasovagal, orthostatic.  No seizure activity was noted, no focal deficit noted.  Orthostatic vitals negative this morning. -CT head showed no intracranial abnormality -Follow 2D echo,  orthostatic vitals -Very weak and deconditioned, will check PT OT evaluation Addendum:  I was notified that daughter concerned about drowsiness, didn't get any opiates or sedatives. DC'ed neurontin  - will check ABG, TSH, UA+ Cx, ammonia. If neg, will check MRI brain   Active Problems:   HLD (hyperlipidemia) -Continue Zocor    Chronic diastolic CHF (congestive heart failure) (HCC) -Currently holding Lasix, no peripheral edema or JVD, compensated BNP 58  Acute on chronic kidney disease, stage IV (severe) (HCC) -Baseline creatinine 1.7-2.4, presented with creatinine of 2.9, not a good candidate for dialysis -Patient received 250 cc normal saline in ED, hold lisinopril, Lasix -Creatinine improving to 2.6, not yet at baseline    Hyperkalemia -Presented with potassium of 6.0, improving to 5.2, continue veltassa    HTN (hypertension) -BP now elevated, antihypertensives were held on admission -Placed on hydralazine IV as needed with parameters, - will restart clonidine patch, Cardura at 4 mg daily  Hypothyroidism Continue Synthroid  Shingles Continue prednisone, Valtrex, gabapentin Discussed with daughter at the bedside regarding decreasing Percocet, only used for severe pain Calamine lotion as needed  Coronary artery disease, carotid artery disease Continue Plavix, Zocor   Code Status: DNR DVT Prophylaxis:  Lovenox  Family Communication: Discussed all imaging results, lab results, explained to the  patient and daughter at the bedside   Disposition Plan:     Status is: Observation  The patient remains OBS appropriate and will d/c before 2 midnights.  Dispo: The patient is from: Home              Anticipated d/c is to: Home              Anticipated d/c date is: 1 day              Patient currently is not medically stable to d/c.  Patient's daughter feels she is very weak, deconditioned, still mental status not close to baseline.  Hopefully DC home in a.m. creatinine  improving, mental status improving, pending PT evaluation      Time Spent in minutes 35 minutes Procedures:  None  Consultants:   None  Antimicrobials:   Anti-infectives (From admission, onward)   Start     Dose/Rate Route Frequency Ordered Stop   08/13/19 2330  valACYclovir (VALTREX) tablet 1,000 mg     Discontinue     1,000 mg Oral Daily at bedtime 08/13/19 0214     08/12/19 2200  valACYclovir (VALTREX) tablet 1,000 mg  Status:  Discontinued        1,000 mg Oral 2 times daily 08/12/19 1638 08/13/19 0214          Medications  Scheduled Meds: . budesonide  0.5 mg Inhalation Daily  . calamine   Topical TID  . clopidogrel  75 mg Oral Daily  . erythromycin   Both Eyes QHS  . gabapentin  100 mg Oral BID  . heparin  5,000 Units Subcutaneous Q8H  . levothyroxine  88 mcg Oral Q0600  . mometasone-formoterol  2 puff Inhalation BID  . montelukast  10 mg Oral QHS  . oxybutynin  5 mg Oral Daily  . pantoprazole  20 mg Oral Daily  . patiromer  16.8 g Oral Daily  . predniSONE  20 mg Oral BID  . simvastatin  40 mg Oral QHS  . valACYclovir  1,000 mg Oral QHS   Continuous Infusions: PRN Meds:.acetaminophen, albuterol, ALPRAZolam, docusate sodium, fentaNYL (SUBLIMAZE) injection, ipratropium-albuterol, ondansetron (ZOFRAN) IV, oxyCODONE-acetaminophen      Subjective:   Cale Gassman was seen and examined today.  No acute complaints, still has itching and pain in the right chest area (shingles).  Overnight no acute issues.  Patient denies dizziness, chest pain, shortness of breath, abdominal pain, N/V/D/C.  Very weak and deconditioned.  Afebrile Objective:   Vitals:   08/12/19 2352 08/13/19 0016 08/13/19 0627 08/13/19 0724  BP: (!) 159/39 (!) 165/78 (!) 157/30 (!) 149/36  Pulse: 74 77 67 66  Resp: 17 17 18 16   Temp: 98.7 F (37.1 C) 98.2 F (36.8 C) 97.9 F (36.6 C) (!) 97.5 F (36.4 C)  TempSrc: Oral Oral Oral Oral  SpO2: 97% 97% 98% 96%  Weight:      Height:         Intake/Output Summary (Last 24 hours) at 08/13/2019 1150 Last data filed at 08/13/2019 0647 Gross per 24 hour  Intake 250 ml  Output 350 ml  Net -100 ml     Wt Readings from Last 3 Encounters:  08/12/19 75 kg  08/07/19 75.8 kg  08/05/19 80.7 kg     Exam  General: Fairly alert and oriented, x2  Cardiovascular: S1 S2 auscultated, no murmurs, RRR  Respiratory: Clear to auscultation bilaterally, no wheezing, rales or rhonchi  Gastrointestinal: Soft, nontender, nondistended, + bowel  sounds  Ext: no pedal edema bilaterally  Neuro: No new deficits, moving all 4 extremities  Musculoskeletal: No digital cyanosis, clubbing  Skin: Shingles rash, scabbing in the right back and front chest area  Psych: Normal affect and demeanor, alert and awake, very weak and deconditioned   Data Reviewed:  I have personally reviewed following labs and imaging studies  Micro Results Recent Results (from the past 240 hour(s))  SARS Coronavirus 2 by RT PCR (hospital order, performed in [facility] hospital lab) Nasopharyngeal Nasopharyngeal Swab     Status: None   Collection Time: 08/06/19  4:26 PM   Specimen: Nasopharyngeal Swab  Result Value Ref Range Status   SARS Coronavirus 2 NEGATIVE NEGATIVE Final    Comment: (NOTE) SARS-CoV-2 target nucleic acids are NOT DETECTED.  The SARS-CoV-2 RNA is generally detectable in upper and lower respiratory specimens during the acute phase of infection. The lowest concentration of SARS-CoV-2 viral copies this assay can detect is 250 copies / mL. A negative result does not preclude SARS-CoV-2 infection and should not be used as the sole basis for treatment or other patient management decisions.  A negative result may occur with improper specimen collection / handling, submission of specimen other than nasopharyngeal swab, presence of viral mutation(s) within the areas targeted by this assay, and inadequate number of viral copies (<250 copies /  mL). A negative result must be combined with clinical observations, patient history, and epidemiological information.  Fact Sheet for Patients:   StrictlyIdeas.no  Fact Sheet for Healthcare Providers: BankingDealers.co.za  This test is not yet approved or  cleared by the [place] FDA and has been authorized for detection and/or diagnosis of SARS-CoV-2 by FDA under an Emergency Use Authorization (EUA).  This EUA will remain in effect (meaning this test can be used) for the duration of the COVID-19 declaration under Section 564(b)(1) of the Act, 21 U.S.C. section 360bbb-3(b)(1), unless the authorization is terminated or revoked sooner.  Performed at [facility], [address] [place], [place] [address]   SARS Coronavirus 2 by RT PCR (hospital order, performed in [facility] hospital lab) Nasopharyngeal Nasopharyngeal Swab     Status: None   Collection Time: 08/12/19  2:39 PM   Specimen: Nasopharyngeal Swab  Result Value Ref Range Status   SARS Coronavirus 2 NEGATIVE NEGATIVE Final    Comment: (NOTE) SARS-CoV-2 target nucleic acids are NOT DETECTED.  The SARS-CoV-2 RNA is generally detectable in upper and lower respiratory specimens during the acute phase of infection. The lowest concentration of SARS-CoV-2 viral copies this assay can detect is 250 copies / mL. A negative result does not preclude SARS-CoV-2 infection and should not be used as the sole basis for treatment or other patient management decisions.  A negative result may occur with improper specimen collection / handling, submission of specimen other than nasopharyngeal swab, presence of viral mutation(s) within the areas targeted by this assay, and inadequate number of viral copies (<250 copies / mL). A negative result must be combined with clinical observations, patient history, and epidemiological information.  Fact Sheet for Patients:     StrictlyIdeas.no  Fact Sheet for Healthcare Providers: BankingDealers.co.za  This test is not yet approved or  cleared by the [place] FDA and has been authorized for detection and/or diagnosis of SARS-CoV-2 by FDA under an Emergency Use Authorization (EUA).  This EUA will remain in effect (meaning this test can be used) for the duration of the COVID-19 declaration under Section 564(b)(1) of the  Act, 21 U.S.C. section 360bbb-3(b)(1), unless the authorization is terminated or revoked sooner.  Performed at [facility], [address], [place], [place] [address]     Radiology Reports DG Chest 2 View  Result Date: 08/06/2019 CLINICAL DATA:  Chest pain and shortness of breath EXAM: CHEST - 2 VIEW COMPARISON:  July 28, 2019 FINDINGS: There is no edema or airspace opacity. Heart is upper normal in size with pulmonary vascularity normal. No adenopathy. There is a moderate hiatal hernia, stable. There is aortic atherosclerosis. No pneumothorax. No bone lesions. IMPRESSION: No edema or airspace opacity. Stable cardiac silhouette. Hiatal hernia present. No evident adenopathy. Aortic Atherosclerosis (ICD10-I70.0). Electronically Signed   By: Lowella Grip III M.D.   On: 08/06/2019 10:06   DG Chest 2 View  Result Date: 07/28/2019 CLINICAL DATA:  Shortness of breath EXAM: CHEST - 2 VIEW COMPARISON:  01/29/2018 FINDINGS: The heart size and mediastinal contours are mildly enlarged, stable. Atherosclerotic calcification of the aortic knob. Moderate-sized hiatal hernia is again noted. No focal airspace consolidation, pleural effusion, or pneumothorax. The visualized skeletal structures are within normal limits. IMPRESSION: No active cardiopulmonary disease. Electronically Signed   By: Davina Poke D.O.   On: 07/28/2019 13:11   CT HEAD WO CONTRAST  Result Date: 08/12/2019 CLINICAL DATA:  Syncope. EXAM: CT HEAD WITHOUT CONTRAST TECHNIQUE:  Contiguous axial images were obtained from the base of the skull through the vertex without intravenous contrast. COMPARISON:  September 21, 2016 FINDINGS: Brain: There is mild cerebral atrophy with widening of the extra-axial spaces and ventricular dilatation. There are areas of decreased attenuation within the white matter tracts of the supratentorial brain, consistent with microvascular disease changes. Vascular: No hyperdense vessel or unexpected calcification. Skull: Normal. Negative for fracture or focal lesion. Sinuses/Orbits: No acute finding. Other: None. IMPRESSION: 1. Generalized cerebral atrophy. 2. No acute intracranial abnormality. Electronically Signed   By: Virgina Norfolk M.D.   On: 08/12/2019 17:05   CT Chest Wo Contrast  Result Date: 08/06/2019 CLINICAL DATA:  Nonspecific chest pain EXAM: CT CHEST WITHOUT CONTRAST TECHNIQUE: Multidetector CT imaging of the chest was performed following the standard protocol without IV contrast. COMPARISON:  01/13/2017 FINDINGS: Cardiovascular: Normal heart size. No pericardial effusion. Extensive atherosclerotic calcification of the aorta, great vessels, and coronaries. Mediastinum/Nodes: Moderate sliding hiatal hernia.  No adenopathy. Lungs/Pleura: There is no edema, consolidation, effusion, or pneumothorax. Scattered pulmonary nodules measuring up to 5 mm in the left lower lobe 7 mm in the right upper lobe. These are stable from prior and considered benign. Mild subpleural atelectasis or scarring in the apical lungs. Upper Abdomen: Small calcification and left upper pole cystic density. Musculoskeletal: Extensive spondylosis with multi-level thoracic bridging. No evidence of fracture or erosion. IMPRESSION: 1. No acute finding. 2. Extensive atherosclerosis. 3. Scattered pulmonary nodules that are stable and benign. 4. Moderate hiatal hernia. Aortic Atherosclerosis (ICD10-I70.0). Electronically Signed   By: Monte Fantasia M.D.   On: 08/06/2019 13:41   US  Venous Img Lower Bilateral (DVT)  Result Date: 08/06/2019 CLINICAL DATA:  Lower extremity swelling EXAM: BILATERAL LOWER EXTREMITY VENOUS DOPPLER ULTRASOUND TECHNIQUE: Gray-scale sonography with compression, as well as color and duplex ultrasound, were performed to evaluate the deep venous system(s) from the level of the common femoral vein through the popliteal and proximal calf veins. COMPARISON:  02/10/2019 right lower extremity venous Doppler scan FINDINGS: VENOUS Normal compressibility of the common femoral, superficial femoral, and popliteal veins, as well as the visualized calf veins. Visualized portions of  profunda femoral vein and great saphenous vein unremarkable. No filling defects to suggest DVT on grayscale or color Doppler imaging. Doppler waveforms show normal direction of venous flow, normal respiratory plasticity and response to augmentation. Limited views of the contralateral common femoral vein are unremarkable. OTHER None. Limitations: none IMPRESSION: No evidence of deep venous thrombosis in either lower extremity. Electronically Signed   By: Ilona Sorrel M.D.   On: 08/06/2019 16:23   DG HIP UNILAT WITH PELVIS 2-3 VIEWS RIGHT  Result Date: 08/12/2019 CLINICAL DATA:  Right groin pain EXAM: DG HIP (WITH OR WITHOUT PELVIS) 2-3V RIGHT COMPARISON:  None. FINDINGS: Degenerative changes of the right hip joint are noted. Pelvic ring is intact. No acute fracture or dislocation is noted. IMPRESSION: Significant degenerative change of the right hip. Electronically Signed   By: Inez Catalina M.D.   On: 08/12/2019 16:49    Lab Data:  CBC: Recent Labs  Lab 08/07/19 0611 08/12/19 1300 08/13/19 1050  WBC 8.7 8.9 8.3  NEUTROABS  --  6.8  --   HGB 8.4* 10.2* 9.7*  HCT 25.8* 30.6* 28.3*  MCV 98.5 94.4 93.1  PLT 166 157 563*   Basic Metabolic Panel: Recent Labs  Lab 08/06/19 1626 08/12/19 1300 08/13/19 1050  NA  --  133* 133*  K  --  6.0* 5.2*  CL  --  100 104  CO2  --  23 23    GLUCOSE  --  148* 153*  BUN  --  78* 74*  CREATININE  --  2.99* 2.64*  CALCIUM  --  8.6* 8.5*  MG 1.8  --   --    GFR: Estimated Creatinine Clearance: 13.4 mL/min (A) (by C-G formula based on SCr of 2.64 mg/dL (H)). Liver Function Tests: Recent Labs  Lab 08/06/19 1249 08/12/19 1300  AST 17 14*  ALT 13 11  ALKPHOS 66 58  BILITOT 0.9 0.7  PROT 5.6* 5.2*  ALBUMIN 3.2* 2.9*   Recent Labs  Lab 08/06/19 1249  LIPASE 21   No results for input(s): AMMONIA in the last 168 hours. Coagulation Profile: Recent Labs  Lab 08/06/19 1626  INR 1.2   Cardiac Enzymes: No results for input(s): CKTOTAL, CKMB, CKMBINDEX, TROPONINI in the last 168 hours. BNP (last 3 results) No results for input(s): PROBNP in the last 8760 hours. HbA1C: No results for input(s): HGBA1C in the last 72 hours. CBG: Recent Labs  Lab 08/12/19 1301  GLUCAP 140*   Lipid Profile: No results for input(s): CHOL, HDL, LDLCALC, TRIG, CHOLHDL, LDLDIRECT in the last 72 hours. Thyroid Function Tests: No results for input(s): TSH, T4TOTAL, FREET4, T3FREE, THYROIDAB in the last 72 hours. Anemia Panel: No results for input(s): VITAMINB12, FOLATE, FERRITIN, TIBC, IRON, RETICCTPCT in the last 72 hours. Urine analysis:    Component Value Date/Time   COLORURINE YELLOW (A) 09/21/2016 2255   APPEARANCEUR Cloudy (A) 03/15/2019 0909   LABSPEC 1.010 09/21/2016 2255   LABSPEC 1.025 10/11/2013 1637   PHURINE 6.0 09/21/2016 2255   GLUCOSEU Negative 03/15/2019 0909   GLUCOSEU Negative 10/11/2013 1637   HGBUR NEGATIVE 09/21/2016 2255   BILIRUBINUR Negative 03/15/2019 0909   BILIRUBINUR Negative 10/11/2013 1637   KETONESUR NEGATIVE 09/21/2016 2255   PROTEINUR 1+ (A) 03/15/2019 0909   PROTEINUR NEGATIVE 09/21/2016 2255   NITRITE Negative 03/15/2019 0909   NITRITE NEGATIVE 09/21/2016 2255   LEUKOCYTESUR Negative 03/15/2019 0909   LEUKOCYTESUR Negative 10/11/2013 1637     Lanita Stammen M.D. [facility] 08/13/2019, 11:50  AM   Call night coverage person covering after 7pm

## 2019-08-13 NOTE — Progress Notes (Signed)
OT Cancellation Note  Patient Details Name: Amy [name] MRN: [id number] DOB: 1927/12/02   Cancelled Treatment:    Reason Eval/Treat Not Completed: Patient not medically ready. OT order received and chart reviewed. Pt noted to have most recent lab values showing K+ 6.0, per therapy guidelines will hold OT evaluation until pt medically able to participate in physical activity. Will follow acutely and initiate services as appropriate.   Dessie Coma, M.S. OTR/L  08/13/19, 8:23 AM

## 2019-08-13 NOTE — Plan of Care (Signed)
  Problem: Clinical Measurements: Goal: Will remain free from infection Outcome: Progressing   Problem: Education: Goal: Knowledge of condition and prescribed therapy will improve Outcome: Progressing   Problem: Cardiac: Goal: Will achieve and/or maintain adequate cardiac output Outcome: Progressing

## 2019-08-14 ENCOUNTER — Inpatient Hospital Stay: Admit: 2019-08-14 | Payer: Medicare Other

## 2019-08-14 ENCOUNTER — Inpatient Hospital Stay (HOSPITAL_COMMUNITY)
Admit: 2019-08-14 | Discharge: 2019-08-14 | Disposition: A | Discharge status: Home / Self Care | Payer: Medicare Other | Attending: Internal Medicine | Admitting: Internal Medicine

## 2019-08-14 DIAGNOSIS — I340 Nonrheumatic mitral (valve) insufficiency: Secondary | ICD-10-CM

## 2019-08-14 DIAGNOSIS — I350 Nonrheumatic aortic (valve) stenosis: Secondary | ICD-10-CM

## 2019-08-14 LAB — BASIC METABOLIC PANEL
Anion gap: 7 (ref 5–15)
BUN: 68 mg/dL — ABNORMAL HIGH (ref 8–23)
CO2: 23 mmol/L (ref 22–32)
Calcium: 8.6 mg/dL — ABNORMAL LOW (ref 8.9–10.3)
Chloride: 102 mmol/L (ref 98–111)
Creatinine, Ser: 2.32 mg/dL — ABNORMAL HIGH (ref 0.44–1.00)
GFR calc Af Amer: 21 mL/min — ABNORMAL LOW (ref >60)
GFR calc non Af Amer: 18 mL/min — ABNORMAL LOW (ref >60)
Glucose, Bld: 192 mg/dL — ABNORMAL HIGH (ref 70–99)
Potassium: 5.5 mmol/L — ABNORMAL HIGH (ref 3.5–5.1)
Sodium: 132 mmol/L — ABNORMAL LOW (ref 135–145)

## 2019-08-14 MED ORDER — AMLODIPINE BESYLATE 5 MG PO TABS
5.0000 mg | ORAL_TABLET | Freq: Every day | ORAL | Status: DC
  Administered 2019-08-14 – 2019-08-15 (×2): 5 mg via ORAL
  Filled 2019-08-14 (×2): qty 1

## 2019-08-14 MED ORDER — DOXAZOSIN MESYLATE 8 MG PO TABS
8.0000 mg | ORAL_TABLET | Freq: Every day | ORAL | Status: DC
  Administered 2019-08-15: 8 mg via ORAL
  Filled 2019-08-14: qty 1

## 2019-08-14 MED ORDER — ACETAMINOPHEN 500 MG PO TABS
1000.0000 mg | ORAL_TABLET | Freq: Three times a day (TID) | ORAL | Status: DC
  Administered 2019-08-14 (×2): 1000 mg via ORAL
  Filled 2019-08-14 (×3): qty 2

## 2019-08-14 MED ORDER — PATIROMER SORBITEX CALCIUM 8.4 G PO PACK
25.2000 g | PACK | Freq: Every day | ORAL | Status: DC
  Administered 2019-08-14 – 2019-08-15 (×2): 25.2 g via ORAL
  Filled 2019-08-14 (×2): qty 3

## 2019-08-14 NOTE — Progress Notes (Signed)
PT Cancellation Note  Patient Details Name: Amy [name] MRN: [id number] DOB: 12-Jul-1927   Cancelled Treatment:    Reason Eval/Treat Not Completed: Medical issues which prohibited therapy, PT will follow up as able.   [address] [place] DPT 08/14/2019, 12:48 PM

## 2019-08-14 NOTE — Progress Notes (Signed)
OT Cancellation Note  Patient Details Name: Amy [name] MRN: [id number] DOB: 1927/06/03   Cancelled Treatment:    Reason Eval/Treat Not Completed: Medical issues which prohibited therapy. Chart reviewed. This AM pt noted to continue to have high K+ (5.5), per therapy guidelines will hold OT evaluation and initiate services as pt appropriate to participate in physical activity.   Dessie Coma, M.S. OTR/L  08/14/19, 1:04 PM

## 2019-08-14 NOTE — Progress Notes (Signed)
[facility]                                                                              Patient Demographics  Amy [name], is a 86 y.o. female, DOB - 04/20/1927, YDX:[id number]  Admit date - 08/12/2019   Admitting Physician Amy Costa, MD  Outpatient Primary MD for the patient is Amy Hire, MD  Outpatient specialists:   LOS - 1  days   Medical records reviewed and are as summarized below:    Chief Complaint  Patient presents with  . unresponsive       Brief summary   Patient is a 86 y.o. 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 Per patient's daughter, patient was seen in clinic waiting for lab work done, suddenly become unresponsive days, which lasted for about 1 to 2 minutes.  Patient does not have unilateral numbness or tingling his extremities.  No facial droop or slurred speech.  Patient has chronic shortness of breath which has not changed.   Patient had developed shingles in her right back and chest area, has been taking prednisone, Valtrex, gabapentin and Percocet.  Patient is also also noticed some confusion with pain medication. Patient was initially hypotensive with blood pressure 85/43, which improved to 114/58 after giving 250 cc normal saline bolus in ED.  Assessment & Plan    Principal Problem:   Unresponsiveness/syncopal episode -Unclear etiology, possibly vasovagal however patient had hypotension on admission which could have contributed -Other possibilities include arrhythmia given hyperkalemia, vasovagal, orthostatic.  No seizure activity was noted, no focal deficit noted.  Orthostatic vitals negative this morning. -CT head showed no intracranial abnormality -2D echo results  pending  Active problems  Acute metabolic encephalopathy, somnolent -ABG showed no CO2 narcosis, TSH 1.9, UA negative for UTI, -Avoid sedatives, narcotics -Neurontin discontinued.  Today much more alert and awake, responding appropriately to questions however very deconditioned -Discussed in detail with the patient's daughter at the bedside, patient lives alone in the guest house of her niece.  At this time do not feel she is strong enough to manage on her own.  Patient does not wish to stay in daughter's home.  She is agreeable to go to SNF/rehab until she gets stronger to manage independently. -Obtain PT OT evaluation, likely will need SNF     HLD (hyperlipidemia) -Continue Zocor    Chronic diastolic CHF (congestive heart failure) (HCC) -Continue to hold Lasix, no peripheral edema or JVD, compensated BNP 58  Acute on chronic kidney disease, stage IV (severe) (HCC) -Baseline creatinine 1.7-2.4, presented with creatinine of 2.9, not a good candidate for dialysis -Patient received 250 cc normal saline in ED -Continue to hold lisinopril, Lasix, creatinine improving to 2.3, at her baseline    Hyperkalemia -Presented with potassium of 6. -Potassium increased to 5.5 again today, increase the dose of Veltassa    HTN (hypertension) -Placed on hydralazine IV as needed with parameters, - will restart clonidine patch,  -Increase  Cardura to 8 mg daily, added amlodipine 5 mg daily  Hypothyroidism Continue Synthroid  Shingles Continue prednisone, Valtrex Discussed with daughter at the bedside, discontinued Neurontin due to confusion and drowsiness. Continue Tylenol scheduled today, calamine lotion as needed   Coronary artery disease, carotid artery disease Continue Plavix, Zocor   Code Status: DNR DVT Prophylaxis:  Lovenox  Family Communication: Discussed all imaging results, lab results, explained to the patient and daughter at the bedside   Disposition Plan:     Status is:  Inpatient  The patient will require care spanning > 2 midnights and should be moved to inpatient because: Inpatient level of care appropriate due to severity of illness  Dispo: The patient is from: Home              Anticipated d/c is to: SNF              Anticipated d/c date is: 1 day              Patient currently is not medically stable to d/c.  Patient is too weak and deconditioned to be giving him on home on currently pending PT evaluation.n      Time Spent in minutes 35 minutes Procedures:  None  Consultants:   None  Antimicrobials:   Anti-infectives (From admission, onward)   Start     Dose/Rate Route Frequency Ordered Stop   08/13/19 2330  valACYclovir (VALTREX) tablet 1,000 mg     Discontinue     1,000 mg Oral Daily at bedtime 08/13/19 0214     08/12/19 2200  valACYclovir (VALTREX) tablet 1,000 mg  Status:  Discontinued        1,000 mg Oral 2 times daily 08/12/19 1638 08/13/19 0214         Medications  Scheduled Meds: . acetaminophen  1,000 mg Oral TID  . budesonide  0.5 mg Inhalation Daily  . calamine   Topical TID  . cloNIDine  0.2 mg Transdermal Weekly  . clopidogrel  75 mg Oral Daily  . doxazosin  4 mg Oral Daily  . erythromycin   Both Eyes QHS  . heparin  5,000 Units Subcutaneous Q8H  . levothyroxine  88 mcg Oral Q0600  . mometasone-formoterol  2 puff Inhalation BID  . montelukast  10 mg Oral QHS  . oxybutynin  5 mg Oral Daily  . pantoprazole  20 mg Oral Daily  . patiromer  16.8 g Oral Daily  . predniSONE  20 mg Oral BID  . simvastatin  40 mg Oral QHS  . valACYclovir  1,000 mg Oral QHS   Continuous Infusions: PRN Meds:.acetaminophen, albuterol, ALPRAZolam, docusate sodium, fentaNYL (SUBLIMAZE) injection, hydrALAZINE, ipratropium-albuterol, ondansetron (ZOFRAN) IV, oxyCODONE-acetaminophen      Subjective:   Amy [name] was seen and examined today.  Much more alert and oriented today, still very weak and deconditioned.  Lives alone per  daughter..  Patient denies dizziness, chest pain, shortness of breath, abdominal pain, N/V/D/C.  Afebrile  Objective:   Vitals:   08/13/19 2100 08/13/19 2344 08/14/19 0506 08/14/19 0937  BP: (!) 159/35 (!) 160/44 (!) 193/48 (!) 168/41  Pulse: 74 73 71 67  Resp: 18 18 16 18   Temp: 98.6 F (37 C) 98.2 F (36.8 C) 98.3 F (36.8 C) 97.8 F (36.6 C)  TempSrc: Oral Oral Oral Oral  SpO2: 96% 96% 95% 99%  Weight:   75.9 kg   Height:        Intake/Output Summary (Last 24 hours) at  08/14/2019 1116 Last data filed at 08/14/2019 0950 Gross per 24 hour  Intake 360 ml  Output 900 ml  Net -540 ml     Wt Readings from Last 3 Encounters:  08/14/19 75.9 kg  08/07/19 75.8 kg  08/05/19 80.7 kg    Physical Exam  General: Alert and oriented today, NAD  Cardiovascular: S1 S2 clear, RRR. No pedal edema b/l  Respiratory: CTAB, no wheezing, rales or rhonchi  Gastrointestinal: Soft, nontender, nondistended, NBS  Ext: no pedal edema bilaterally  Neuro: no new deficits  Musculoskeletal: No cyanosis, clubbing  Skin: Shingles rash, scabbing on the right back and front chest  Psych: Normal affect and demeanor, alert and oriented x3    Data Reviewed:  I have personally reviewed following labs and imaging studies  Micro Results Recent Results (from the past 240 hour(s))  SARS Coronavirus 2 by RT PCR (hospital order, performed in [place] hospital lab) Nasopharyngeal Nasopharyngeal Swab     Status: None   Collection Time: 08/06/19  4:26 PM   Specimen: Nasopharyngeal Swab  Result Value Ref Range Status   SARS Coronavirus 2 NEGATIVE NEGATIVE Final    Comment: (NOTE) SARS-CoV-2 target nucleic acids are NOT DETECTED.  The SARS-CoV-2 RNA is generally detectable in upper and lower respiratory specimens during the acute phase of infection. The lowest concentration of SARS-CoV-2 viral copies this assay can detect is 250 copies / mL. A negative result does not preclude SARS-CoV-2  infection and should not be used as the sole basis for treatment or other patient management decisions.  A negative result may occur with improper specimen collection / handling, submission of specimen other than nasopharyngeal swab, presence of viral mutation(s) within the areas targeted by this assay, and inadequate number of viral copies (<250 copies / mL). A negative result must be combined with clinical observations, patient history, and epidemiological information.  Fact Sheet for Patients:   StrictlyIdeas.no  Fact Sheet for Healthcare Providers: BankingDealers.co.za  This test is not yet approved or  cleared by the [place] FDA and has been authorized for detection and/or diagnosis of SARS-CoV-2 by FDA under an Emergency Use Authorization (EUA).  This EUA will remain in effect (meaning this test can be used) for the duration of the COVID-19 declaration under Section 564(b)(1) of the Act, 21 U.S.C. section 360bbb-3(b)(1), unless the authorization is terminated or revoked sooner.  Performed at [facility], [address] [place], [place] [address]   SARS Coronavirus 2 by RT PCR (hospital order, performed in [facility] hospital lab) Nasopharyngeal Nasopharyngeal Swab     Status: None   Collection Time: 08/12/19  2:39 PM   Specimen: Nasopharyngeal Swab  Result Value Ref Range Status   SARS Coronavirus 2 NEGATIVE NEGATIVE Final    Comment: (NOTE) SARS-CoV-2 target nucleic acids are NOT DETECTED.  The SARS-CoV-2 RNA is generally detectable in upper and lower respiratory specimens during the acute phase of infection. The lowest concentration of SARS-CoV-2 viral copies this assay can detect is 250 copies / mL. A negative result does not preclude SARS-CoV-2 infection and should not be used as the sole basis for treatment or other patient management decisions.  A negative result may occur with improper specimen  collection / handling, submission of specimen other than nasopharyngeal swab, presence of viral mutation(s) within the areas targeted by this assay, and inadequate number of viral copies (<250 copies / mL). A negative result must be combined with clinical observations, patient history, and epidemiological information.  Fact Sheet for Patients:   StrictlyIdeas.no  Fact Sheet for Healthcare Providers: BankingDealers.co.za  This test is not yet approved or  cleared by the [place] FDA and has been authorized for detection and/or diagnosis of SARS-CoV-2 by FDA under an Emergency Use Authorization (EUA).  This EUA will remain in effect (meaning this test can be used) for the duration of the COVID-19 declaration under Section 564(b)(1) of the Act, 21 U.S.C. section 360bbb-3(b)(1), unless the authorization is terminated or revoked sooner.  Performed at [facility], [address] [place], [place] [address]     Radiology Reports DG Chest 2 View  Result Date: 08/06/2019 CLINICAL DATA:  Chest pain and shortness of breath EXAM: CHEST - 2 VIEW COMPARISON:  July 28, 2019 FINDINGS: There is no edema or airspace opacity. Heart is upper normal in size with pulmonary vascularity normal. No adenopathy. There is a moderate hiatal hernia, stable. There is aortic atherosclerosis. No pneumothorax. No bone lesions. IMPRESSION: No edema or airspace opacity. Stable cardiac silhouette. Hiatal hernia present. No evident adenopathy. Aortic Atherosclerosis (ICD10-I70.0). Electronically Signed   By: Lowella Grip III M.D.   On: 08/06/2019 10:06   DG Chest 2 View  Result Date: 07/28/2019 CLINICAL DATA:  Shortness of breath EXAM: CHEST - 2 VIEW COMPARISON:  01/29/2018 FINDINGS: The heart size and mediastinal contours are mildly enlarged, stable. Atherosclerotic calcification of the aortic knob. Moderate-sized hiatal hernia is again noted. No focal  airspace consolidation, pleural effusion, or pneumothorax. The visualized skeletal structures are within normal limits. IMPRESSION: No active cardiopulmonary disease. Electronically Signed   By: Davina Poke D.O.   On: 07/28/2019 13:11   CT HEAD WO CONTRAST  Result Date: 08/12/2019 CLINICAL DATA:  Syncope. EXAM: CT HEAD WITHOUT CONTRAST TECHNIQUE: Contiguous axial images were obtained from the base of the skull through the vertex without intravenous contrast. COMPARISON:  September 21, 2016 FINDINGS: Brain: There is mild cerebral atrophy with widening of the extra-axial spaces and ventricular dilatation. There are areas of decreased attenuation within the white matter tracts of the supratentorial brain, consistent with microvascular disease changes. Vascular: No hyperdense vessel or unexpected calcification. Skull: Normal. Negative for fracture or focal lesion. Sinuses/Orbits: No acute finding. Other: None. IMPRESSION: 1. Generalized cerebral atrophy. 2. No acute intracranial abnormality. Electronically Signed   By: Virgina Norfolk M.D.   On: 08/12/2019 17:05   CT Chest Wo Contrast  Result Date: 08/06/2019 CLINICAL DATA:  Nonspecific chest pain EXAM: CT CHEST WITHOUT CONTRAST TECHNIQUE: Multidetector CT imaging of the chest was performed following the standard protocol without IV contrast. COMPARISON:  01/13/2017 FINDINGS: Cardiovascular: Normal heart size. No pericardial effusion. Extensive atherosclerotic calcification of the aorta, great vessels, and coronaries. Mediastinum/Nodes: Moderate sliding hiatal hernia.  No adenopathy. Lungs/Pleura: There is no edema, consolidation, effusion, or pneumothorax. Scattered pulmonary nodules measuring up to 5 mm in the left lower lobe 7 mm in the right upper lobe. These are stable from prior and considered benign. Mild subpleural atelectasis or scarring in the apical lungs. Upper Abdomen: Small calcification and left upper pole cystic density. Musculoskeletal:  Extensive spondylosis with multi-level thoracic bridging. No evidence of fracture or erosion. IMPRESSION: 1. No acute finding. 2. Extensive atherosclerosis. 3. Scattered pulmonary nodules that are stable and benign. 4. Moderate hiatal hernia. Aortic Atherosclerosis (ICD10-I70.0). Electronically Signed   By: Monte Fantasia M.D.   On: 08/06/2019 13:41   US Venous Img Lower Bilateral (DVT)  Result Date: 08/06/2019 CLINICAL DATA:  Lower extremity swelling EXAM: BILATERAL  LOWER EXTREMITY VENOUS DOPPLER ULTRASOUND TECHNIQUE: Gray-scale sonography with compression, as well as color and duplex ultrasound, were performed to evaluate the deep venous system(s) from the level of the common femoral vein through the popliteal and proximal calf veins. COMPARISON:  02/10/2019 right lower extremity venous Doppler scan FINDINGS: VENOUS Normal compressibility of the common femoral, superficial femoral, and popliteal veins, as well as the visualized calf veins. Visualized portions of profunda femoral vein and great saphenous vein unremarkable. No filling defects to suggest DVT on grayscale or color Doppler imaging. Doppler waveforms show normal direction of venous flow, normal respiratory plasticity and response to augmentation. Limited views of the contralateral common femoral vein are unremarkable. OTHER None. Limitations: none IMPRESSION: No evidence of deep venous thrombosis in either lower extremity. Electronically Signed   By: Ilona Sorrel M.D.   On: 08/06/2019 16:23   DG HIP UNILAT WITH PELVIS 2-3 VIEWS RIGHT  Result Date: 08/12/2019 CLINICAL DATA:  Right groin pain EXAM: DG HIP (WITH OR WITHOUT PELVIS) 2-3V RIGHT COMPARISON:  None. FINDINGS: Degenerative changes of the right hip joint are noted. Pelvic ring is intact. No acute fracture or dislocation is noted. IMPRESSION: Significant degenerative change of the right hip. Electronically Signed   By: Inez Catalina M.D.   On: 08/12/2019 16:49    Lab  Data:  CBC: Recent Labs  Lab 08/12/19 1300 08/13/19 1050  WBC 8.9 8.3  NEUTROABS 6.8  --   HGB 10.2* 9.7*  HCT 30.6* 28.3*  MCV 94.4 93.1  PLT 157 882*   Basic Metabolic Panel: Recent Labs  Lab 08/12/19 1300 08/13/19 1050 08/14/19 0649  NA 133* 133* 132*  K 6.0* 5.2* 5.5*  CL 100 104 102  CO2 23 23 23   GLUCOSE 148* 153* 192*  BUN 78* 74* 68*  CREATININE 2.99* 2.64* 2.32*  CALCIUM 8.6* 8.5* 8.6*   GFR: Estimated Creatinine Clearance: 15.4 mL/min (A) (by C-G formula based on SCr of 2.32 mg/dL (H)). Liver Function Tests: Recent Labs  Lab 08/12/19 1300  AST 14*  ALT 11  ALKPHOS 58  BILITOT 0.7  PROT 5.2*  ALBUMIN 2.9*   No results for input(s): LIPASE, AMYLASE in the last 168 hours. Recent Labs  Lab 08/13/19 1722  AMMONIA <9*   Coagulation Profile: No results for input(s): INR, PROTIME in the last 168 hours. Cardiac Enzymes: No results for input(s): CKTOTAL, CKMB, CKMBINDEX, TROPONINI in the last 168 hours. BNP (last 3 results) No results for input(s): PROBNP in the last 8760 hours. HbA1C: No results for input(s): HGBA1C in the last 72 hours. CBG: Recent Labs  Lab 08/12/19 1301  GLUCAP 140*   Lipid Profile: No results for input(s): CHOL, HDL, LDLCALC, TRIG, CHOLHDL, LDLDIRECT in the last 72 hours. Thyroid Function Tests: Recent Labs    08/13/19 1722  TSH 1.962   Anemia Panel: No results for input(s): VITAMINB12, FOLATE, FERRITIN, TIBC, IRON, RETICCTPCT in the last 72 hours. Urine analysis:    Component Value Date/Time   COLORURINE YELLOW (A) 08/12/2019 1830   APPEARANCEUR CLEAR (A) 08/12/2019 1830   APPEARANCEUR Cloudy (A) 03/15/2019 0909   LABSPEC 1.010 08/12/2019 1830   LABSPEC 1.025 10/11/2013 1637   PHURINE 6.0 08/12/2019 1830   GLUCOSEU NEGATIVE 08/12/2019 1830   GLUCOSEU Negative 10/11/2013 1637   HGBUR NEGATIVE 08/12/2019 1830   BILIRUBINUR NEGATIVE 08/12/2019 1830   BILIRUBINUR Negative 03/15/2019 0909   BILIRUBINUR Negative  10/11/2013 [place] 08/12/2019 1830   PROTEINUR NEGATIVE 08/12/2019 1830  NITRITE NEGATIVE 08/12/2019 1830   LEUKOCYTESUR NEGATIVE 08/12/2019 1830   LEUKOCYTESUR Negative 10/11/2013 1637     Princes Finger M.D. [facility] 08/14/2019, 11:16 AM   Call night coverage person covering after 7pm

## 2019-08-14 NOTE — Plan of Care (Signed)
  Problem: Health Behavior/Discharge Planning: Goal: Ability to manage health-related needs will improve Outcome: Progressing   Problem: Clinical Measurements: Goal: Will remain free from infection Outcome: Progressing   Problem: Activity: Goal: Risk for activity intolerance will decrease Outcome: Progressing   Problem: Pain Managment: Goal: General experience of comfort will improve Outcome: Progressing   Problem: Education: Goal: Knowledge of condition and prescribed therapy will improve Outcome: Progressing   Problem: Cardiac: Goal: Will achieve and/or maintain adequate cardiac output Outcome: Progressing   Problem: Physical Regulation: Goal: Complications related to the disease process, condition or treatment will be avoided or minimized Outcome: Progressing

## 2019-08-15 ENCOUNTER — Other Ambulatory Visit: Payer: Self-pay | Admitting: Internal Medicine

## 2019-08-15 DIAGNOSIS — Z7189 Other specified counseling: Secondary | ICD-10-CM

## 2019-08-15 DIAGNOSIS — Z515 Encounter for palliative care: Secondary | ICD-10-CM

## 2019-08-15 LAB — URINE CULTURE: Culture: 70000 — AB

## 2019-08-15 LAB — BASIC METABOLIC PANEL
Anion gap: 6 (ref 5–15)
BUN: 68 mg/dL — ABNORMAL HIGH (ref 8–23)
CO2: 22 mmol/L (ref 22–32)
Calcium: 8.6 mg/dL — ABNORMAL LOW (ref 8.9–10.3)
Chloride: 105 mmol/L (ref 98–111)
Creatinine, Ser: 2.04 mg/dL — ABNORMAL HIGH (ref 0.44–1.00)
GFR calc Af Amer: 24 mL/min — ABNORMAL LOW (ref >60)
GFR calc non Af Amer: 21 mL/min — ABNORMAL LOW (ref >60)
Glucose, Bld: 168 mg/dL — ABNORMAL HIGH (ref 70–99)
Potassium: 5.1 mmol/L (ref 3.5–5.1)
Sodium: 133 mmol/L — ABNORMAL LOW (ref 135–145)

## 2019-08-15 MED ORDER — CALAMINE EX LOTN: TOPICAL_LOTION | Freq: Three times a day (TID) | CUTANEOUS | 1 refills | Status: DC

## 2019-08-15 MED ORDER — PATIROMER SORBITEX CALCIUM 8.4 G PO PACK: 25.2000 g | PACK | Freq: Every day | ORAL | 0 refills | Status: DC

## 2019-08-15 MED ORDER — ACETAMINOPHEN 500 MG PO TABS: 500.0000 mg | ORAL_TABLET | Freq: Four times a day (QID) | ORAL | 0 refills | Status: DC | PRN

## 2019-08-15 MED ORDER — AMLODIPINE BESYLATE 5 MG PO TABS
5.0000 mg | ORAL_TABLET | Freq: Once | ORAL | Status: AC
  Administered 2019-08-15: 5 mg via ORAL
  Filled 2019-08-15: qty 1

## 2019-08-15 MED ORDER — AMLODIPINE BESYLATE 10 MG PO TABS: 10.0000 mg | ORAL_TABLET | Freq: Every day | ORAL | Status: DC

## 2019-08-15 NOTE — Evaluation (Signed)
Physical Therapy Evaluation Patient Details Name: CLEMMA JOHNSEN MRN: [id number] DOB: [birth year] Today's Date: 08/15/2019   History of Present Illness  Geroldine C Caulfield is a 86 y.o. 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  Admitted for management of syncope, hyperkalemia  Clinical Impression  Upon evaluation, patient alert and oriented; follows commands and demonstrates good effort with mobility tasks.  Bilat UE/LE strength and ROM grossly symmetrical and WFL; no focal weakness reported, no pain indicated.  Able to complete bed mobility with mod indep; sit/stand, basic transfers and short-distance gait (20') with RW, cga/min assist.  Demonstrates mild forward flexed posture, decreased step height/length, choppy gait pattern. Broad turning radius with decreased balance reactions evident. Mod SOB with minimal distance (BORG 8/10), vitals stable and WFL.  Do recommend continued use of RW for balance, safety and overall energy conservation with mobility tasks. Would benefit from skilled PT to address above deficits and promote optimal return to PLOF.; Recommend transition to HHPT upon discharge from acute hospitalization.     Follow Up Recommendations Home health PT    Equipment Recommendations   (has necessary equip)    Recommendations for Other Services       Precautions / Restrictions Precautions Precautions: Fall Restrictions Weight Bearing Restrictions: No      Mobility  Bed Mobility Overal bed mobility: Modified Independent                Transfers Overall transfer level: Needs assistance Equipment used: Rolling walker (2 wheeled) Transfers: Sit to/from Stand Sit to Stand: Min assist;Min guard          General transfer comment: cuing for hand placement with lift off; mild dizziness with initial transition to upright, resolves with accommodation to position  Ambulation/Gait Ambulation/Gait assistance: Min guard;Min assist Gait Distance (Feet): 20 Feet Assistive device: Rolling walker (2 wheeled)       General Gait Details: mild forward flexed posture, decreased step height/length, choppy gait pattern. Broad turning radius with decreased balance reactions evident. Mod SOB with minimal distance (BORG 8/10), vitals stable and WFL.  Do recommend continued use of RW for balance, safety and overall energy conservation with mobility tasks.  Stairs            Wheelchair Mobility    Modified Rankin (Stroke Patients Only)       Balance Overall balance assessment: Needs assistance Sitting-balance support: No upper extremity supported;Feet supported Sitting balance-Leahy Scale: Good     Standing balance support: Bilateral upper extremity supported Standing balance-Leahy Scale: Fair                               Pertinent Vitals/Pain Pain Assessment: No/denies pain    Home Living Family/patient expects to be discharged to:: Private residence Living Arrangements: Alone Available Help at Discharge: Family;Available PRN/intermittently Type of Home: House Home Access: Stairs to enter Entrance Stairs-Rails: Left Entrance Stairs-Number of Steps: 5 Home Layout: One level Home Equipment: Walker - 2 wheels;Walker - 4 wheels;Kasandra Knudsen - single point Additional Comments: Lives in "guest house" on granddaughter's property; family very involved, supportive with care    Prior Function Level of Independence: Independent with assistive device(s)         Comments: Mod indep for ADLs, limited household distances, "furniture grabbing" along pathways.  Utilizes WC for longer-community distances, but out of house activity limited to MD appointments only.  Denies fall history.  Family  provides frequent check in and support.     Hand Dominance   Dominant Hand: Right    Extremity/Trunk Assessment   Upper Extremity Assessment Upper Extremity Assessment: Overall WFL for tasks assessed    Lower Extremity Assessment Lower Extremity Assessment: Overall WFL for tasks assessed       Communication   Communication: No difficulties  Cognition Arousal/Alertness: Awake/alert Behavior During Therapy: WFL for tasks assessed/performed Overall Cognitive Status: Within Functional Limits for tasks assessed                                        General Comments      Exercises Other Exercises Other Exercises: Reviewed role of PT and progressive mobility in acute setting; discussed energy conservation/activity pacing strategies, household modifications, recommendations for RW use and 24/7 support from family at discharge. Patient and family (son at bedside) voice agreement and understanding of all information.   Assessment/Plan    PT Assessment Patient needs continued PT services  PT Problem List Decreased strength;Decreased activity tolerance;Decreased balance;Decreased mobility;Decreased coordination;Decreased knowledge of use of DME;Decreased safety awareness;Cardiopulmonary status limiting activity;Decreased knowledge of precautions       PT Treatment Interventions DME instruction;Gait training;Stair training;Functional mobility training;Therapeutic exercise;Therapeutic activities;Balance training;Patient/family education    PT Goals (Current goals can be found in the Care Plan section)  Acute Rehab PT Goals Patient Stated Goal: to return home with family support PT Goal Formulation: With patient/family Time For Goal Achievement: 08/29/19 Potential to Achieve Goals: Fair    Frequency Min 2X/week   Barriers to discharge        Co-evaluation               AM-PAC PT "6 Clicks" Mobility  Outcome Measure Help needed turning from your back  to your side while in a flat bed without using bedrails?: None Help needed moving from lying on your back to sitting on the side of a flat bed without using bedrails?: None Help needed moving to and from a bed to a chair (including a wheelchair)?: A Little Help needed standing up from a chair using your arms (e.g., wheelchair or bedside chair)?: A Little Help needed to walk in hospital room?: A Little Help needed climbing 3-5 steps with a railing? : A Little 6 Click Score: 20    End of Session Equipment Utilized During Treatment: Gait belt Activity Tolerance: Patient tolerated treatment well Patient left: in bed;with call bell/phone within reach;with bed alarm set Nurse Communication: Mobility status PT Visit Diagnosis: Muscle weakness (generalized) (M62.81);Difficulty in walking, not elsewhere classified (R26.2)    Time: 2595-6387 PT Time Calculation (min) (ACUTE ONLY): 25 min   Charges:   PT Evaluation $PT Eval Moderate Complexity: 1 Mod PT Treatments $Therapeutic Activity: 8-22 mins        Milind Raether H. Owens Shark, PT, DPT, NCS 08/15/19, 2:00 PM [phone]

## 2019-08-15 NOTE — Discharge Summary (Signed)
Physician Discharge Summary   Patient ID: Amy [name] MRN: [id number] DOB/AGE: March 05, 1927 86 y.o.  Admit date: 08/12/2019 Discharge date: 08/15/2019  Primary Care Physician:  [name], MD   Recommendations for Outpatient Follow-up:  1. Follow up with PCP in 1-2 weeks  Home Health: Home health PT OT, RN, home health aide Equipment/Devices: Patient has rolling walker at home  Discharge Condition: stable CODE STATUS: DNR Diet recommendation: Heart healthy diet   Discharge Diagnoses:    . Unresponsiveness/syncopal episode  Acute metabolic encephalopathy with somnolence, resolved . Hyperkalemia . HTN (hypertension) . HLD (hyperlipidemia) . GERD (gastroesophageal reflux disease) . Chronic kidney disease, stage IV (severe) ([place]) . Chronic diastolic CHF (congestive heart failure) ([place]) . Hypothyroidism . CAD (coronary artery disease) . Carotid arterial disease ([place]) . Hypotension . Shingles    Consults:  None    Allergies:   Allergies  Allergen Reactions  . Losartan Other (See Comments)    Hyperkalemia  . Morphine And Related Shortness Of Breath    Rash, difficulty breathing, nausea.   . Atenolol Other (See Comments)    Other reaction(s): Other (See Comments) Decreased heart rate Decreased heart rate  . Codeine     Rash, difficulty breathing, nausea.  . Nsaids Other (See Comments)    Other reaction(s): Unknown  . Rofecoxib     Other reaction(s): Unknown  . Sucralfate Other (See Comments)    Throat tightness  . Sulfa Antibiotics     Other reaction(s): Unknown     DISCHARGE MEDICATIONS: Allergies as of 08/15/2019      Reactions   Losartan Other (See Comments)   Hyperkalemia   Morphine And Related Shortness Of Breath   Rash, difficulty breathing, nausea.   Atenolol Other (See Comments)   Other reaction(s): Other (See Comments) Decreased heart rate Decreased heart rate   Codeine    Rash, difficulty breathing, nausea.   Nsaids Other  (See Comments)   Other reaction(s): Unknown   Rofecoxib    Other reaction(s): Unknown   Sucralfate Other (See Comments)   Throat tightness   Sulfa Antibiotics    Other reaction(s): Unknown      Medication List    STOP taking these medications   gabapentin 100 MG capsule Commonly known as: NEURONTIN   lisinopril 2.5 MG tablet Commonly known as: ZESTRIL   oxyCODONE-acetaminophen 5-325 MG tablet Commonly known as: PERCOCET/ROXICET     TAKE these medications   acetaminophen 500 MG tablet Commonly known as: TYLENOL Take 1 tablet (500 mg total) by mouth every 6 (six) hours as needed for mild pain or moderate pain. Over the counter   albuterol 108 (90 Base) MCG/ACT inhaler Commonly known as: VENTOLIN HFA Inhale 2 puffs into the lungs every 6 (six) hours as needed for wheezing or shortness of breath.   ALPRAZolam 0.25 MG tablet Commonly known as: XANAX Take by mouth.   budesonide 0.5 MG/2ML nebulizer solution Commonly known as: PULMICORT Inhale into the lungs.   calamine lotion Apply topically 3 (three) times daily. Apply to affected areas. Also available OTC.   cloNIDine 0.2 mg/24hr patch Commonly known as: CATAPRES - Dosed in mg/24 hr Place 1 patch (0.2 mg total) onto the skin once a week.   clopidogrel 75 MG tablet Commonly known as: PLAVIX Take 1 tablet (75 mg total) by mouth daily.   Diclofenac Sodium 1 % Crea Apply 1 application topically 3 (three) times daily as needed.   docusate sodium 100 MG capsule Commonly known as: COLACE Take  100 mg by mouth daily as needed for mild constipation. Takes daily   doxazosin 8 MG tablet Commonly known as: CARDURA Take 1 tablet (8 mg total) by mouth daily.   erythromycin ophthalmic ointment   furosemide 20 MG tablet Commonly known as: LASIX Take 1 tablet (20 mg) by mouth once daily as needed for weight gain/ shortness of breath/ swelling   hydrALAZINE 100 MG tablet Commonly known as: APRESOLINE Take 100 mg by  mouth 3 (three) times daily.   ipratropium-albuterol 0.5-2.5 (3) MG/3ML Soln Commonly known as: DUONEB Take 3 mLs by nebulization every 4 (four) hours as needed (shortness of breath/wheezing.).   levothyroxine 88 MCG tablet Commonly known as: SYNTHROID   montelukast 10 MG tablet Commonly known as: SINGULAIR TAKE 1 TABLET BY MOUTH EVERY DAY AT NIGHT   oxybutynin 5 MG 24 hr tablet Commonly known as: DITROPAN-XL Take 5 mg by mouth daily.   pantoprazole 40 MG tablet Commonly known as: PROTONIX Take 20 mg by mouth daily.   patiromer 8.4 g packet Commonly known as: VELTASSA Take 3 packets (25.2 g total) by mouth daily. Start taking on: August 16, 2019   predniSONE 20 MG tablet Commonly known as: DELTASONE Take 20 mg by mouth 2 (two) times daily.   simvastatin 40 MG tablet Commonly known as: ZOCOR Take 1 tablet (40 mg total) by mouth at bedtime.   Symbicort 160-4.5 MCG/ACT inhaler Generic drug: budesonide-formoterol Inhale 2 puffs into the lungs 2 (two) times daily.   valACYclovir 1000 MG tablet Commonly known as: VALTREX Take 1,000 mg by mouth 2 (two) times daily.        Brief H and P: For complete details please refer to admission H and P, but in brief Patient is a 87 y.o.femalewith medical history significant ofhypertension, hyperlipidemia, diet-controlled diabetes, CKD-IV-V (nota good candidate for dialysis), GI bleeding, CHF, CAD, stent placement, bladder incontinence, carotid artery stenosis, anemia, shingles, who presents with unresponsiveness, right leg pain. Per patient's daughter, patient was seen in clinic waiting for lab work done, suddenly become unresponsive days, which lasted for about 1 to 2 minutes. Patient does not have unilateral numbness or tingling his extremities. No facial droop or slurred speech. Patient has chronic shortness of breath which has not changed.  Patient had developed shingles in her right back and chest area, has been taking  prednisone, Valtrex, gabapentin and Percocet.  Patient is also also noticed some confusion with pain medication. Patient was initially hypotensive with blood pressure 85/43, which improved to 114/58 after giving 250 cc normal saline bolus in ED.  Hospital Course:    Unresponsiveness/syncopal episode -Unclear etiology, possibly vasovagal however patient had hypotension on admission which could have contributed -Other possibilities include arrhythmia given hyperkalemia, vasovagal, orthostatic.  No seizure activity was noted, no focal deficit noted.  Orthostatic vitals negativ -CT head showed no intracranial abnormality -2D echo showed EF of 60 to 96%, grade 1 diastolic dysfunction, mild aortic valve stenosis.  Gradient 17.0 mmHg   Acute metabolic encephalopathy, somnolent -Likely due to Neurontin, Percocet. -ABG showed no CO2 narcosis, TSH 1.9, UA negative for UTI, -Neurontin discontinued.  Patient has been much more alert and oriented now, back to baseline.  Patient was started on scheduled Tylenol while inpatient. -PT OT evaluation obtained, recommended home health     HLD (hyperlipidemia) -Continue Zocor    Chronic diastolic CHF (congestive heart failure) ([facility]) -Lasix was held while inpatient, no peripheral edema or JVD, compensated BNP 58  Acute on chronic kidney disease,  stage IV (severe) ([facility]) -Baseline creatinine 1.7-2.4, presented with creatinine of 2.9, not a good candidate for dialysis -Patient received 250 cc normal saline in ED -Discontinue lisinopril.  Lasix was held -Creatinine 2.0, at her baseline    Hyperkalemia -Presented with potassium of 6. -Currently on Veltassa, potassium 5.1 at the time of discharge.  Recommended to continue ultrasound to follow-up with her nephrologist in 7 to 10 days.    HTN (hypertension) -Resume hydralazine, Cardura, clonidine patch,  Hypothyroidism Continue Synthroid  Shingles Continue prednisone, Valtrex Discussed with  daughter at the bedside, discontinued Neurontin due to confusion and drowsiness. Continue Tylenol scheduled today, calamine lotion as needed    Coronary artery disease, carotid artery disease Continue Plavix, Zocor   Day of Discharge S: *Alert and oriented, back to her baseline, feeling a whole lot better today.  Pain is controlled  BP (!) 163/41 (BP Location: Left Arm)   Pulse 75   Temp 98.1 F (36.7 C) (Oral)   Resp 17   Ht 5\' 2"  (1.575 m)   Wt 75.9 kg   SpO2 98%   BMI 30.60 kg/m   Physical Exam: General: Alert and awake oriented x3 not in any acute distress. HEENT: anicteric sclera, pupils reactive to light and accommodation CVS: S1-S2 clear no murmur rubs or gallops Chest: clear to auscultation bilaterally, no wheezing rales or rhonchi.  Shingles crusting Abdomen: soft nontender, nondistended, normal bowel sounds Extremities: no cyanosis, clubbing or edema noted bilaterally Neuro: Cranial nerves II-XII intact, no focal neurological deficits    Get Medicines reviewed and adjusted: Please take all your medications with you for your next visit with your Primary MD  Please request your Primary MD to go over all hospital tests and procedure/radiological results at the follow up. Please ask your Primary MD to get all Hospital records sent to his/her office.  If you experience worsening of your admission symptoms, develop shortness of breath, life threatening emergency, suicidal or homicidal thoughts you must seek medical attention immediately by calling 911 or calling your MD immediately  if symptoms less severe.  You must read complete instructions/literature along with all the possible adverse reactions/side effects for all the Medicines you take and that have been prescribed to you. Take any new Medicines after you have completely understood and accept all the possible adverse reactions/side effects.   Do not drive when taking pain medications.   Do not take more than  prescribed Pain, Sleep and Anxiety Medications  Special Instructions: If you have smoked or chewed Tobacco  in the last 2 yrs please stop smoking, stop any regular Alcohol  and or any Recreational drug use.  Wear Seat belts while driving.  Please note  You were cared for by a hospitalist during your hospital stay. Once you are discharged, your primary care physician will handle any further medical issues. Please note that NO REFILLS for any discharge medications will be authorized once you are discharged, as it is imperative that you return to your primary care physician (or establish a relationship with a primary care physician if you do not have one) for your aftercare needs so that they can reassess your need for medications and monitor your lab values.   The results of significant diagnostics from this hospitalization (including imaging, microbiology, ancillary and laboratory) are listed below for reference.      Procedures/Studies:  DG Chest 2 View  Result Date: 08/06/2019 CLINICAL DATA:  Chest pain and shortness of breath EXAM: CHEST - 2 VIEW COMPARISON:  July 28, 2019 FINDINGS: There is no edema or airspace opacity. Heart is upper normal in size with pulmonary vascularity normal. No adenopathy. There is a moderate hiatal hernia, stable. There is aortic atherosclerosis. No pneumothorax. No bone lesions. IMPRESSION: No edema or airspace opacity. Stable cardiac silhouette. Hiatal hernia present. No evident adenopathy. Aortic Atherosclerosis (ICD10-I70.0). Electronically Signed   By: Lowella Grip III M.D.   On: 08/06/2019 10:06   DG Chest 2 View  Result Date: 07/28/2019 CLINICAL DATA:  Shortness of breath EXAM: CHEST - 2 VIEW COMPARISON:  01/29/2018 FINDINGS: The heart size and mediastinal contours are mildly enlarged, stable. Atherosclerotic calcification of the aortic knob. Moderate-sized hiatal hernia is again noted. No focal airspace consolidation, pleural effusion, or pneumothorax.  The visualized skeletal structures are within normal limits. IMPRESSION: No active cardiopulmonary disease. Electronically Signed   By: Davina Poke D.O.   On: 07/28/2019 13:11   CT HEAD WO CONTRAST  Result Date: 08/12/2019 CLINICAL DATA:  Syncope. EXAM: CT HEAD WITHOUT CONTRAST TECHNIQUE: Contiguous axial images were obtained from the base of the skull through the vertex without intravenous contrast. COMPARISON:  September 21, 2016 FINDINGS: Brain: There is mild cerebral atrophy with widening of the extra-axial spaces and ventricular dilatation. There are areas of decreased attenuation within the white matter tracts of the supratentorial brain, consistent with microvascular disease changes. Vascular: No hyperdense vessel or unexpected calcification. Skull: Normal. Negative for fracture or focal lesion. Sinuses/Orbits: No acute finding. Other: None. IMPRESSION: 1. Generalized cerebral atrophy. 2. No acute intracranial abnormality. Electronically Signed   By: Virgina Norfolk M.D.   On: 08/12/2019 17:05   CT Chest Wo Contrast  Result Date: 08/06/2019 CLINICAL DATA:  Nonspecific chest pain EXAM: CT CHEST WITHOUT CONTRAST TECHNIQUE: Multidetector CT imaging of the chest was performed following the standard protocol without IV contrast. COMPARISON:  01/13/2017 FINDINGS: Cardiovascular: Normal heart size. No pericardial effusion. Extensive atherosclerotic calcification of the aorta, great vessels, and coronaries. Mediastinum/Nodes: Moderate sliding hiatal hernia.  No adenopathy. Lungs/Pleura: There is no edema, consolidation, effusion, or pneumothorax. Scattered pulmonary nodules measuring up to 5 mm in the left lower lobe 7 mm in the right upper lobe. These are stable from prior and considered benign. Mild subpleural atelectasis or scarring in the apical lungs. Upper Abdomen: Small calcification and left upper pole cystic density. Musculoskeletal: Extensive spondylosis with multi-level thoracic bridging. No  evidence of fracture or erosion. IMPRESSION: 1. No acute finding. 2. Extensive atherosclerosis. 3. Scattered pulmonary nodules that are stable and benign. 4. Moderate hiatal hernia. Aortic Atherosclerosis (ICD10-I70.0). Electronically Signed   By: Monte Fantasia M.D.   On: 08/06/2019 13:41   US Venous Img Lower Bilateral (DVT)  Result Date: 08/06/2019 CLINICAL DATA:  Lower extremity swelling EXAM: BILATERAL LOWER EXTREMITY VENOUS DOPPLER ULTRASOUND TECHNIQUE: Gray-scale sonography with compression, as well as color and duplex ultrasound, were performed to evaluate the deep venous system(s) from the level of the common femoral vein through the popliteal and proximal calf veins. COMPARISON:  02/10/2019 right lower extremity venous Doppler scan FINDINGS: VENOUS Normal compressibility of the common femoral, superficial femoral, and popliteal veins, as well as the visualized calf veins. Visualized portions of profunda femoral vein and great saphenous vein unremarkable. No filling defects to suggest DVT on grayscale or color Doppler imaging. Doppler waveforms show normal direction of venous flow, normal respiratory plasticity and response to augmentation. Limited views of the contralateral common femoral vein are unremarkable. OTHER None. Limitations: none IMPRESSION: No evidence of deep venous thrombosis  in either lower extremity. Electronically Signed   By: Ilona Sorrel M.D.   On: 08/06/2019 16:23   ECHOCARDIOGRAM COMPLETE BUBBLE STUDY  Result Date: 08/14/2019    ECHOCARDIOGRAM REPORT   Patient Name:   Amy [name] Date of Exam: 08/14/2019 Medical Rec #:  [id number]          Height:       62.0 in Accession #:    [id number]         Weight:       167.3 lb Date of Birth:  09-Apr-1927          BSA:          1.772 m Patient Age:    68 years           BP:           193/48 mmHg Patient Gender: F                  HR:           71 bpm. Exam Location:  [facility] Procedure: 2D Echo and Saline Contrast Bubble Study  Indications:     Syncope  History:         Patient has prior history of Echocardiogram examinations. CAD;                  Risk Factors:Hypertension, Dyslipidemia and Diabetes.  Sonographer:     L Thornton-Maynard Referring Phys:  [id number] K Keishawn Darsey Diagnosing Phys: Ida Rogue MD IMPRESSIONS  1. Left ventricular ejection fraction, by estimation, is 60 to 65%. The left ventricle has normal function. The left ventricle has no regional wall motion abnormalities. There is moderate to severe left ventricular hypertrophy. Left ventricular diastolic parameters are consistent with Grade I diastolic dysfunction (impaired relaxation).  2. Right ventricular systolic function is normal. The right ventricular size is normal. Tricuspid regurgitation signal is inadequate for assessing PA pressure.  3. Left atrial size was severely dilated  4. The aortic valve is normal in structure. Aortic valve regurgitation is not visualized. Mild aortic valve stenosis. Aortic valve mean gradient measures 17.0 mmHg. Aortic valve Vmax measures 2.74 m/s. FINDINGS  Left Ventricle: Left ventricular ejection fraction, by estimation, is 60 to 65%. The left ventricle has normal function. The left ventricle has no regional wall motion abnormalities. The left ventricular internal cavity size was normal in size. There is  moderate left ventricular hypertrophy. Left ventricular diastolic parameters are consistent with Grade I diastolic dysfunction (impaired relaxation). Right Ventricle: The right ventricular size is normal. No increase in right ventricular wall thickness. Right ventricular systolic function is normal. Tricuspid regurgitation signal is inadequate for assessing PA pressure. Left Atrium: Left atrial size was severely dilated. Right Atrium: Right atrial size was normal in size. Pericardium: There is no evidence of pericardial effusion. Mitral Valve: The mitral valve is normal in structure. Normal mobility of the mitral valve leaflets.  Severe mitral annular calcification. Mild mitral valve regurgitation. No evidence of mitral valve stenosis. MV peak gradient, 14.4 mmHg. The mean mitral valve gradient is 7.0 mmHg. Tricuspid Valve: The tricuspid valve is normal in structure. Tricuspid valve regurgitation is not demonstrated. No evidence of tricuspid stenosis. Aortic Valve: The aortic valve is normal in structure. Aortic valve regurgitation is not visualized. Mild aortic stenosis is present. Aortic valve mean gradient measures 17.0 mmHg. Aortic valve peak gradient measures 30.0 mmHg. Aortic valve area, by VTI measures 1.13 cm. Pulmonic Valve: The pulmonic valve was normal in  structure. Pulmonic valve regurgitation is not visualized. No evidence of pulmonic stenosis. Aorta: The aortic root is normal in size and structure. Venous: The inferior vena cava is normal in size with greater than 50% respiratory variability, suggesting right atrial pressure of 3 mmHg. IAS/Shunts: No atrial level shunt detected by color flow Doppler. Agitated saline contrast was given intravenously to evaluate for intracardiac shunting.  LEFT VENTRICLE PLAX 2D LVIDd:         2.96 cm  Diastology LVIDs:         2.13 cm  LV e' lateral:   3.48 cm/s LV PW:         1.63 cm  LV E/e' lateral: 38.8 LV IVS:        1.96 cm  LV e' medial:    4.68 cm/s LVOT diam:     1.70 cm  LV E/e' medial:  28.8 LV SV:         61 LV SV Index:   34 LVOT Area:     2.27 cm  RIGHT VENTRICLE RV S prime:     14.00 cm/s TAPSE (M-mode): 3.7 cm LEFT ATRIUM              Index LA diam:        3.60 cm  2.03 cm/m LA Vol (A2C):   75.3 ml  42.49 ml/m LA Vol (A4C):   113.0 ml 63.77 ml/m LA Biplane Vol: 95.6 ml  53.95 ml/m  AORTIC VALVE                    PULMONIC VALVE AV Area (Vmax):    1.06 cm     PV Vmax:       1.58 m/s AV Area (Vmean):   1.15 cm     PV Vmean:      125.000 cm/s AV Area (VTI):     1.13 cm     PV VTI:        0.356 m AV Vmax:           274.00 cm/s  PV Peak grad:  10.0 mmHg AV Vmean:           190.000 cm/s PV Mean grad:  7.0 mmHg AV VTI:            0.538 m AV Peak Grad:      30.0 mmHg AV Mean Grad:      17.0 mmHg LVOT Vmax:         128.00 cm/s LVOT Vmean:        96.300 cm/s LVOT VTI:          0.268 m LVOT/AV VTI ratio: 0.50  AORTA Ao Root diam: 2.90 cm MITRAL VALVE MV Area (PHT): 1.76 cm     SHUNTS MV Peak grad:  14.4 mmHg    Systemic VTI:  0.27 m MV Mean grad:  7.0 mmHg     Systemic Diam: 1.70 cm MV Vmax:       1.90 m/s MV Vmean:      126.0 cm/s MV Decel Time: 430 msec MV E velocity: 135.00 cm/s MV A velocity: 183.00 cm/s MV E/A ratio:  0.74 Ida Rogue MD Electronically signed by Ida Rogue MD Signature Date/Time: 08/14/2019/2:22:07 PM    Final    DG HIP UNILAT WITH PELVIS 2-3 VIEWS RIGHT  Result Date: 08/12/2019 CLINICAL DATA:  Right groin pain EXAM: DG HIP (WITH OR WITHOUT PELVIS) 2-3V RIGHT COMPARISON:  None. FINDINGS: Degenerative changes of the right hip  joint are noted. Pelvic ring is intact. No acute fracture or dislocation is noted. IMPRESSION: Significant degenerative change of the right hip. Electronically Signed   By: Inez Catalina M.D.   On: 08/12/2019 16:49       LAB RESULTS: Basic Metabolic Panel: Recent Labs  Lab 08/14/19 0649 08/15/19 0709  NA 132* 133*  K 5.5* 5.1  CL 102 105  CO2 23 22  GLUCOSE 192* 168*  BUN 68* 68*  CREATININE 2.32* 2.04*  CALCIUM 8.6* 8.6*   Liver Function Tests: Recent Labs  Lab 08/12/19 1300  AST 14*  ALT 11  ALKPHOS 58  BILITOT 0.7  PROT 5.2*  ALBUMIN 2.9*   No results for input(s): LIPASE, AMYLASE in the last 168 hours. Recent Labs  Lab 08/13/19 1722  AMMONIA <9*   CBC: Recent Labs  Lab 08/12/19 1300 08/12/19 1300 08/13/19 1050  WBC 8.9  --  8.3  NEUTROABS 6.8  --   --   HGB 10.2*  --  9.7*  HCT 30.6*  --  28.3*  MCV 94.4   < > 93.1  PLT 157  --  149*   < > = values in this interval not displayed.   Cardiac Enzymes: No results for input(s): CKTOTAL, CKMB, CKMBINDEX, TROPONINI in the last 168  hours. BNP: Invalid input(s): POCBNP CBG: Recent Labs  Lab 08/12/19 1301  GLUCAP 140*       Disposition and Follow-up: Discharge Instructions    Diet - low sodium heart healthy   Complete by: As directed    Increase activity slowly   Complete by: As directed        DISPOSITION: Home with home health   DISCHARGE FOLLOW-UP  Follow-up Information    [name], MD. Schedule an appointment as soon as possible for a visit in 2 week(s).   Specialty: Internal Medicine Contact information: [place] [address] [phone]        Minna Merritts, MD .   Specialty: Cardiology Contact information: [place] [address] [phone]        Murlean Iba, MD. Schedule an appointment as soon as possible for a visit in 10 day(s).   Specialty: Nephrology Why: please follow up, needs labs for potassium and renal function   Contact information: [place] [address] [phone]                Time coordinating discharge:  35 minutes  Signed:   Estill Cotta M.D. [facility] 08/15/2019, 12:32 PM

## 2019-08-15 NOTE — Consult Note (Signed)
Consultation Note Date: 08/15/2019   Patient Name: Amy [name]  DOB: Mar 23, 1927  MRN: [id number]  Age / Sex: 86 y.o., female  PCP: [name], MD Referring Physician: Mendel Corning, MD  Reason for Consultation: Establishing goals of care  HPI/Patient Profile: 86 y.o.femalewith medical history significant ofhypertension, hyperlipidemia, diet-controlled diabetes, CKD-IV-V (nota good candidate for dialysis), GI bleeding, CHF, CAD, stent placement, bladder incontinence, carotid artery stenosis, anemia, shingles, who presents with unresponsiveness, right leg pain. Per patient's daughter, patient was seen in clinic waiting for lab work done, suddenly become unresponsive days, which lasted for about 1 to 2 minutes.   Clinical Assessment and Goals of Care: Patient is sitting in bed with son at bedside. She states she is happy because she just worked with therapy and will be able to go home instead of SNF, as she will have someone to sit with her in her guest house. She has been a resident in her daughter's guest house for around 10 years. She advises that her mother used to live there as well until she died at the age of 25.   [name] discusses her recent diagnosis of shingles, and states the rash and pain are no longer present. She states prior to this hospitalization, she was fully functional and driving. She states she stopped driving  a couple of weeks ago because there is no longer a need to drive since there is always a family member available to take her somewhere.   We discussed her diagnosis, prognosis, GOC, EOL wishes disposition and options.  A detailed discussion was had today regarding advanced directives.  Concepts specific to code status, artifical feeding and hydration, IV antibiotics and rehospitalization were discussed.  The difference between an aggressive medical intervention  path and a comfort care path was discussed.  Values and goals of care important to patient and family were attempted to be elicited.  Discussed limitations of medical interventions to prolong quality of life in some situations and discussed the concept of human mortality.  She confirms being followed by palliative care outpatient. She states she has a living will. She confirms DNR/DNI status. She would never want a feeding tube. She would like to return to the hospital to treat the treatable. [name] tells me nephrology has began discussions on dialysis, and she is aware some physicians feel it would not be a good idea. She states she has not decided how she feels about it as she needs more eduction on dialysis.     SUMMARY OF RECOMMENDATIONS   DNR/DNI. Continue to treat the treatable.   Recommend continued outpatient palliative.   Prognosis:   Poor overall      Primary Diagnoses: Present on Admission: . Unresponsiveness . Hyperkalemia . HTN (hypertension) . HLD (hyperlipidemia) . GERD (gastroesophageal reflux disease) . Chronic kidney disease, stage IV (severe) ([place]) . Chronic diastolic CHF (congestive heart failure) ([place]) . Hypothyroidism . CAD (coronary artery disease) . Carotid arterial disease ([place]) . Hypotension . Shingles . Syncope   I  have reviewed the medical record, interviewed the patient and family, and examined the patient. The following aspects are pertinent.  Past Medical History:  Diagnosis Date  . Bladder incontinence   . CAD (coronary artery disease)    a. 05/2009 Cath: LAD 90p (Xience 2.75 x 12 mm DES), 87m, D1 40, LCx 40p/m, RCA 30/40/30p/m, RCA 30/25d;  b.  12/2011 Lexiscan MV: no ischemia, breast attenuation artifact, normal EF-->Low risk; c. 10/2016 MV: fixed apical defect, most likely apical thinning and attenuation, No ischemia, EF 60%.  . Cancer ([place])    ovarian  . Carotid arterial disease w/ R Carotid Bruit (HCC)    a. 08/2016 Carotid U/S:  40-59% bilat ICA stenosis - f/u 1 yr.  . Chronic diastolic (congestive) heart failure ([place])    a. Echo 09/2015: EF 55-60% w/ Grade 1 DD, sev Ca2+ MV annulus, mildly dil LA; b. 09/2016 Echo: EF 65-70%, Gr2 DD, mildly dil LA/RA, nl RV fxn.  . Chronic Dyspnea on exertion   . CKD (chronic kidney disease) stage 3, GFR 30-59 ml/min   . Degenerative arthritis of knee    bilateral knees  . Diabetes mellitus    Type II  . GIB (gastrointestinal bleeding)    a. 08/2016 Admission w/ presyncope/anemia/melena-->req Transfusion-->endo ok, colonoscopy w/ polyps but no source of bleeding (most likely diverticular).  . Hiatal hernia   . Hypertension   . Iron deficiency   . Menopausal symptoms   . Morbid obesity ([place])   . Renal insufficiency   . Thyroid disease    hypothyroidism   Social History   Socioeconomic History  . Marital status: Widowed    Spouse name: Not on file  . Number of children: Not on file  . Years of education: Not on file  . Highest education level: Not on file  Occupational History  . Not on file  Tobacco Use  . Smoking status: Never Smoker  . Smokeless tobacco: Never Used  Vaping Use  . Vaping Use: Never used  Substance and Sexual Activity  . Alcohol use: No  . Drug use: No  . Sexual activity: Not on file  Other Topics Concern  . Not on file  Social History Narrative  . Not on file   Social Determinants of Health   Financial Resource Strain:   . Difficulty of Paying Living Expenses:   Food Insecurity:   . Worried About Charity fundraiser in the Last Year:   . Arboriculturist in the Last Year:   Transportation Needs:   . Film/video editor (Medical):   Marland Kitchen Lack of Transportation (Non-Medical):   Physical Activity:   . Days of Exercise per Week:   . Minutes of Exercise per Session:   Stress:   . Feeling of Stress :   Social Connections:   . Frequency of Communication with Friends and Family:   . Frequency of Social Gatherings with Friends and Family:     . Attends Religious Services:   . Active Member of Clubs or Organizations:   . Attends Archivist Meetings:   Marland Kitchen Marital Status:    Family History  Problem Relation Age of Onset  . Breast cancer Mother 50   Scheduled Meds: . acetaminophen  1,000 mg Oral TID  . [START ON 08/16/2019] amLODipine  10 mg Oral Daily  . budesonide  0.5 mg Inhalation Daily  . calamine   Topical TID  . cloNIDine  0.2 mg Transdermal Weekly  . clopidogrel  75 mg Oral Daily  . doxazosin  8 mg Oral Daily  . erythromycin   Both Eyes QHS  . heparin  5,000 Units Subcutaneous Q8H  . levothyroxine  88 mcg Oral Q0600  . mometasone-formoterol  2 puff Inhalation BID  . montelukast  10 mg Oral QHS  . oxybutynin  5 mg Oral Daily  . pantoprazole  20 mg Oral Daily  . patiromer  25.2 g Oral Daily  . predniSONE  20 mg Oral BID  . simvastatin  40 mg Oral QHS  . valACYclovir  1,000 mg Oral QHS   Continuous Infusions: PRN Meds:.acetaminophen, albuterol, ALPRAZolam, docusate sodium, fentaNYL (SUBLIMAZE) injection, hydrALAZINE, ipratropium-albuterol, ondansetron (ZOFRAN) IV, oxyCODONE-acetaminophen Medications Prior to Admission:  Prior to Admission medications   Medication Sig Start Date End Date Taking? Authorizing Provider  albuterol (PROVENTIL HFA;VENTOLIN HFA) 108 (90 Base) MCG/ACT inhaler Inhale 2 puffs into the lungs every 6 (six) hours as needed for wheezing or shortness of breath.   Yes [provider]  ALPRAZolam Duanne Moron) 0.25 MG tablet Take by mouth. 08/07/19  Yes [provider]  budesonide (PULMICORT) 0.5 MG/2ML nebulizer solution Inhale into the lungs. 03/16/19 03/15/20 Yes [provider]  cloNIDine (CATAPRES - DOSED IN MG/24 HR) 0.2 mg/24hr patch Place 1 patch (0.2 mg total) onto the skin once a week. 06/29/17  Yes Darylene Price A, FNP  clopidogrel (PLAVIX) 75 MG tablet Take 1 tablet (75 mg total) by mouth daily. 09/27/16  Yes Dustin Flock, MD  Diclofenac Sodium 1 % CREA Apply 1  application topically 3 (three) times daily as needed. 08/07/19  Yes Max Sane, MD  docusate sodium (COLACE) 100 MG capsule Take 100 mg by mouth daily as needed for mild constipation. Takes daily   Yes [provider]  erythromycin ophthalmic ointment  06/14/18  Yes [provider]  furosemide (LASIX) 20 MG tablet Take 1 tablet (20 mg) by mouth once daily as needed for weight gain/ shortness of breath/ swelling 04/05/18  Yes Gollan, Kathlene November, MD  gabapentin (NEURONTIN) 100 MG capsule Take 100 mg by mouth 3 (three) times daily. 08/10/19  Yes [provider]  ipratropium-albuterol (DUONEB) 0.5-2.5 (3) MG/3ML SOLN Take 3 mLs by nebulization every 4 (four) hours as needed (shortness of breath/wheezing.). 10/11/15  Yes Henreitta Leber, MD  levothyroxine (SYNTHROID) 88 MCG tablet  06/20/19  Yes [provider]  lisinopril (ZESTRIL) 2.5 MG tablet Take 1 tablet (2.5 mg total) by mouth daily. 08/07/19 09/06/19 Yes Max Sane, MD  montelukast (SINGULAIR) 10 MG tablet TAKE 1 TABLET BY MOUTH EVERY DAY AT NIGHT 03/24/19  Yes [provider]  oxybutynin (DITROPAN-XL) 5 MG 24 hr tablet Take 5 mg by mouth daily.    Yes [provider]  oxyCODONE-acetaminophen (PERCOCET/ROXICET) 5-325 MG tablet Take by mouth. 08/10/19  Yes [provider]  pantoprazole (PROTONIX) 40 MG tablet Take 20 mg by mouth daily.    Yes [provider]  predniSONE (DELTASONE) 20 MG tablet Take 20 mg by mouth 2 (two) times daily. 08/12/19  Yes [provider]  simvastatin (ZOCOR) 40 MG tablet Take 1 tablet (40 mg total) by mouth at bedtime. 03/22/18  Yes Minna Merritts, MD  SYMBICORT 160-4.5 MCG/ACT inhaler Inhale 2 puffs into the lungs 2 (two) times daily.  11/15/14  Yes [provider]  valACYclovir (VALTREX) 1000 MG tablet Take 1,000 mg by mouth 2 (two) times daily. 08/10/19  Yes [provider]  acetaminophen (TYLENOL) 500 MG tablet Take  1 tablet  (500 mg total) by mouth every 6 (six) hours as needed for mild pain or moderate pain. Over the counter 08/15/19   Rai, Vernelle Emerald, MD  calamine lotion Apply topically 3 (three) times daily. Apply to affected areas. Also available OTC. 08/15/19   Rai, Vernelle Emerald, MD  doxazosin (CARDURA) 8 MG tablet Take 1 tablet (8 mg total) by mouth daily. 03/15/19   Minna Merritts, MD  hydrALAZINE (APRESOLINE) 100 MG tablet Take 100 mg by mouth 3 (three) times daily. 05/21/19   [provider]  patiromer (VELTASSA) 8.4 g packet Take 3 packets (25.2 g total) by mouth daily. 08/16/19   Mendel Corning, MD   Allergies  Allergen Reactions  . Losartan Other (See Comments)    Hyperkalemia  . Morphine And Related Shortness Of Breath    Rash, difficulty breathing, nausea.   . Atenolol Other (See Comments)    Other reaction(s): Other (See Comments) Decreased heart rate Decreased heart rate  . Codeine     Rash, difficulty breathing, nausea.  . Nsaids Other (See Comments)    Other reaction(s): Unknown  . Rofecoxib     Other reaction(s): Unknown  . Sucralfate Other (See Comments)    Throat tightness  . Sulfa Antibiotics     Other reaction(s): Unknown   Review of Systems  All other systems reviewed and are negative.   Physical Exam Pulmonary:     Effort: Pulmonary effort is normal.  Neurological:     Mental Status: She is alert.     Vital Signs: BP (!) 163/41 (BP Location: Left Arm)   Pulse 75   Temp 98.1 F (36.7 C) (Oral)   Resp 17   Ht 5\' 2"  (1.575 m)   Wt 75.9 kg   SpO2 98%   BMI 30.60 kg/m  Pain Scale: 0-10   Pain Score: 0-No pain   SpO2: SpO2: 98 % O2 Device:SpO2: 98 % O2 Flow Rate: .   IO: Intake/output summary:   Intake/Output Summary (Last 24 hours) at 08/15/2019 1234 Last data filed at 08/15/2019 0051 Gross per 24 hour  Intake --  Output 800 ml  Net -800 ml    LBM: Last BM Date: 08/11/19 Baseline Weight: Weight: 75 kg Most recent weight: Weight: 75.9 kg      Palliative Assessment/Data:     Time In: 12:15 Time Out: 12:45 Time Total: 30 min Greater than 50%  of this time was spent counseling and coordinating care related to the above assessment and plan.  Signed by: Asencion Gowda, NP   Please contact Palliative Medicine Team phone at [phone] for questions and concerns.  For individual provider: See Shea Evans

## 2019-08-15 NOTE — TOC Transition Note (Signed)
Transition of Care [facility]) - CM/SW Discharge Note   Patient Details  Name: Amy [name] MRN: [id number] Date of Birth: 1927-05-21  [facility] CM/SW Contact:  Eileen Stanford, LCSW Phone Number: 08/15/2019, 12:56 PM   Clinical Narrative:   Pt d/c home today. HH has been arranged through [facility]. Pt has a walker at home. Pt's daughter transports pt to her appointments. Pt uses mail in order for prescriptions. Pt still has established PCP. NO additional needs at this time.    Final next level of care: Home w Home Health Services Barriers to Discharge: No Barriers Identified   Patient Goals and CMS Choice Patient states their goals for this hospitalization and ongoing recovery are:: to get better   Choice offered to / list presented to : Patient  Discharge Placement                    Patient and family notified of of transfer: 08/15/19  Discharge Plan and Services In-house Referral: Clinical Social Work   Post Acute Care Choice: Home Health                    HH Arranged: RN, PT, OT, Nurse's Aide [place] Agency: [place] ([facility]) Date HH Agency Contacted: 08/15/19 Time [place]: [place] Representative spoke with at [place]: [place] ([place]) Interventions     Readmission Risk Interventions Readmission Risk Prevention Plan 08/15/2019  Transportation Screening Complete  Medication Review Press photographer) Complete  PCP or Specialist appointment within 3-5 days of discharge Complete  HRI or [place] Complete  SW Recovery Care/Counseling Consult Complete  [place] Not Applicable  Some recent data might be hidden

## 2019-08-17 ENCOUNTER — Other Ambulatory Visit
Admission: RE | Admit: 2019-08-17 | Discharge: 2019-08-17 | Disposition: A | Discharge status: Home / Self Care | Payer: Medicare Other | Source: Home / Self Care | Attending: Student | Admitting: Student

## 2019-08-17 ENCOUNTER — Ambulatory Visit: Payer: Medicare Other

## 2019-08-17 ENCOUNTER — Other Ambulatory Visit: Payer: Self-pay | Admitting: Student

## 2019-08-17 DIAGNOSIS — I214 Non-ST elevation (NSTEMI) myocardial infarction: Secondary | ICD-10-CM | POA: Diagnosis not present

## 2019-08-17 DIAGNOSIS — R531 Weakness: Secondary | ICD-10-CM | POA: Diagnosis not present

## 2019-08-17 DIAGNOSIS — E875 Hyperkalemia: Secondary | ICD-10-CM | POA: Insufficient documentation

## 2019-08-17 LAB — POTASSIUM: Potassium: 4.9 mmol/L (ref 3.5–5.1)

## 2019-08-18 ENCOUNTER — Emergency Department: Payer: Medicare Other

## 2019-08-18 ENCOUNTER — Inpatient Hospital Stay
Admission: EM | Admit: 2019-08-18 | Discharge: 2019-08-23 | DRG: 280 | Disposition: A | Discharge status: Skilled Nursing Facility | Payer: Medicare Other | Attending: Hospitalist | Admitting: Hospitalist

## 2019-08-18 ENCOUNTER — Encounter: Payer: Self-pay | Admitting: Emergency Medicine

## 2019-08-18 ENCOUNTER — Other Ambulatory Visit: Payer: Self-pay

## 2019-08-18 DIAGNOSIS — D638 Anemia in other chronic diseases classified elsewhere: Secondary | ICD-10-CM | POA: Diagnosis present

## 2019-08-18 DIAGNOSIS — Z7951 Long term (current) use of inhaled steroids: Secondary | ICD-10-CM

## 2019-08-18 DIAGNOSIS — R778 Other specified abnormalities of plasma proteins: Secondary | ICD-10-CM

## 2019-08-18 DIAGNOSIS — Z955 Presence of coronary angioplasty implant and graft: Secondary | ICD-10-CM

## 2019-08-18 DIAGNOSIS — Z888 Allergy status to other drugs, medicaments and biological substances status: Secondary | ICD-10-CM

## 2019-08-18 DIAGNOSIS — I4510 Unspecified right bundle-branch block: Secondary | ICD-10-CM | POA: Diagnosis present

## 2019-08-18 DIAGNOSIS — W19XXXA Unspecified fall, initial encounter: Secondary | ICD-10-CM | POA: Diagnosis present

## 2019-08-18 DIAGNOSIS — Z885 Allergy status to narcotic agent status: Secondary | ICD-10-CM

## 2019-08-18 DIAGNOSIS — E1122 Type 2 diabetes mellitus with diabetic chronic kidney disease: Secondary | ICD-10-CM | POA: Diagnosis present

## 2019-08-18 DIAGNOSIS — I248 Other forms of acute ischemic heart disease: Secondary | ICD-10-CM | POA: Diagnosis present

## 2019-08-18 DIAGNOSIS — I214 Non-ST elevation (NSTEMI) myocardial infarction: Secondary | ICD-10-CM | POA: Diagnosis not present

## 2019-08-18 DIAGNOSIS — R55 Syncope and collapse: Secondary | ICD-10-CM | POA: Diagnosis present

## 2019-08-18 DIAGNOSIS — Z7902 Long term (current) use of antithrombotics/antiplatelets: Secondary | ICD-10-CM

## 2019-08-18 DIAGNOSIS — N184 Chronic kidney disease, stage 4 (severe): Secondary | ICD-10-CM

## 2019-08-18 DIAGNOSIS — E871 Hypo-osmolality and hyponatremia: Secondary | ICD-10-CM | POA: Diagnosis present

## 2019-08-18 DIAGNOSIS — I4891 Unspecified atrial fibrillation: Secondary | ICD-10-CM | POA: Diagnosis present

## 2019-08-18 DIAGNOSIS — I25119 Atherosclerotic heart disease of native coronary artery with unspecified angina pectoris: Secondary | ICD-10-CM | POA: Diagnosis present

## 2019-08-18 DIAGNOSIS — E785 Hyperlipidemia, unspecified: Secondary | ICD-10-CM | POA: Diagnosis present

## 2019-08-18 DIAGNOSIS — I2510 Atherosclerotic heart disease of native coronary artery without angina pectoris: Secondary | ICD-10-CM | POA: Diagnosis present

## 2019-08-18 DIAGNOSIS — K219 Gastro-esophageal reflux disease without esophagitis: Secondary | ICD-10-CM | POA: Diagnosis present

## 2019-08-18 DIAGNOSIS — E1165 Type 2 diabetes mellitus with hyperglycemia: Secondary | ICD-10-CM | POA: Diagnosis present

## 2019-08-18 DIAGNOSIS — Z79890 Hormone replacement therapy (postmenopausal): Secondary | ICD-10-CM

## 2019-08-18 DIAGNOSIS — I5033 Acute on chronic diastolic (congestive) heart failure: Secondary | ICD-10-CM | POA: Diagnosis present

## 2019-08-18 DIAGNOSIS — Z886 Allergy status to analgesic agent status: Secondary | ICD-10-CM

## 2019-08-18 DIAGNOSIS — I10 Essential (primary) hypertension: Secondary | ICD-10-CM | POA: Diagnosis present

## 2019-08-18 DIAGNOSIS — E039 Hypothyroidism, unspecified: Secondary | ICD-10-CM | POA: Diagnosis present

## 2019-08-18 DIAGNOSIS — I130 Hypertensive heart and chronic kidney disease with heart failure and stage 1 through stage 4 chronic kidney disease, or unspecified chronic kidney disease: Secondary | ICD-10-CM | POA: Diagnosis present

## 2019-08-18 DIAGNOSIS — J449 Chronic obstructive pulmonary disease, unspecified: Secondary | ICD-10-CM

## 2019-08-18 DIAGNOSIS — Z66 Do not resuscitate: Secondary | ICD-10-CM | POA: Diagnosis present

## 2019-08-18 DIAGNOSIS — Z96653 Presence of artificial knee joint, bilateral: Secondary | ICD-10-CM | POA: Diagnosis present

## 2019-08-18 DIAGNOSIS — E875 Hyperkalemia: Secondary | ICD-10-CM | POA: Diagnosis present

## 2019-08-18 DIAGNOSIS — Z20822 Contact with and (suspected) exposure to covid-19: Secondary | ICD-10-CM | POA: Diagnosis present

## 2019-08-18 DIAGNOSIS — Z882 Allergy status to sulfonamides status: Secondary | ICD-10-CM

## 2019-08-18 DIAGNOSIS — R531 Weakness: Secondary | ICD-10-CM

## 2019-08-18 DIAGNOSIS — N179 Acute kidney failure, unspecified: Secondary | ICD-10-CM | POA: Diagnosis present

## 2019-08-18 DIAGNOSIS — Z7952 Long term (current) use of systemic steroids: Secondary | ICD-10-CM

## 2019-08-18 DIAGNOSIS — B029 Zoster without complications: Secondary | ICD-10-CM | POA: Diagnosis present

## 2019-08-18 DIAGNOSIS — Z79899 Other long term (current) drug therapy: Secondary | ICD-10-CM

## 2019-08-18 LAB — CBC
HCT: 28.9 % — ABNORMAL LOW (ref 36.0–46.0)
Hemoglobin: 9.7 g/dL — ABNORMAL LOW (ref 12.0–15.0)
MCH: 31.7 pg (ref 26.0–34.0)
MCHC: 33.6 g/dL (ref 30.0–36.0)
MCV: 94.4 fL (ref 80.0–100.0)
Platelets: 229 10*3/uL (ref 150–400)
RBC: 3.06 MIL/uL — ABNORMAL LOW (ref 3.87–5.11)
RDW: 14.2 % (ref 11.5–15.5)
WBC: 11.4 10*3/uL — ABNORMAL HIGH (ref 4.0–10.5)
nRBC: 0 % (ref 0.0–0.2)

## 2019-08-18 LAB — BASIC METABOLIC PANEL
Anion gap: 12 (ref 5–15)
BUN: 98 mg/dL — ABNORMAL HIGH (ref 8–23)
CO2: 15 mmol/L — ABNORMAL LOW (ref 22–32)
Calcium: 8.9 mg/dL (ref 8.9–10.3)
Chloride: 104 mmol/L (ref 98–111)
Creatinine, Ser: 3.14 mg/dL — ABNORMAL HIGH (ref 0.44–1.00)
GFR calc Af Amer: 14 mL/min — ABNORMAL LOW (ref >60)
GFR calc non Af Amer: 12 mL/min — ABNORMAL LOW (ref >60)
Glucose, Bld: 284 mg/dL — ABNORMAL HIGH (ref 70–99)
Potassium: 4.6 mmol/L (ref 3.5–5.1)
Sodium: 131 mmol/L — ABNORMAL LOW (ref 135–145)

## 2019-08-18 LAB — GLUCOSE, CAPILLARY: Glucose-Capillary: 232 mg/dL — ABNORMAL HIGH (ref 70–99)

## 2019-08-18 LAB — TROPONIN I (HIGH SENSITIVITY)
Troponin I (High Sensitivity): 733 ng/L (ref <18)
Troponin I (High Sensitivity): 724 ng/L (ref <18)

## 2019-08-18 LAB — SARS CORONAVIRUS 2 BY RT PCR (HOSPITAL ORDER, PERFORMED IN ~~LOC~~ HOSPITAL LAB): SARS Coronavirus 2: NEGATIVE

## 2019-08-18 MED ORDER — SODIUM CHLORIDE 0.9 % IV BOLUS
1000.0000 mL | Freq: Once | INTRAVENOUS | Status: AC
  Administered 2019-08-18: 1000 mL via INTRAVENOUS

## 2019-08-18 MED ORDER — NITROGLYCERIN 0.4 MG SL SUBL: 0.4000 mg | SUBLINGUAL_TABLET | SUBLINGUAL | Status: DC | PRN

## 2019-08-18 MED ORDER — HEPARIN (PORCINE) 25000 UT/250ML-% IV SOLN
650.0000 [IU]/h | INTRAVENOUS | Status: DC
  Administered 2019-08-18: 800 [IU]/h via INTRAVENOUS
  Administered 2019-08-20: 650 [IU]/h via INTRAVENOUS
  Filled 2019-08-18 (×2): qty 250

## 2019-08-18 MED ORDER — ONDANSETRON HCL 4 MG/2ML IJ SOLN: 4.0000 mg | Freq: Four times a day (QID) | INTRAMUSCULAR | Status: DC | PRN

## 2019-08-18 MED ORDER — SODIUM CHLORIDE 0.9 % IV SOLN: INTRAVENOUS | Status: AC

## 2019-08-18 MED ORDER — IPRATROPIUM-ALBUTEROL 0.5-2.5 (3) MG/3ML IN SOLN: 3.0000 mL | RESPIRATORY_TRACT | Status: DC | PRN

## 2019-08-18 MED ORDER — HEPARIN BOLUS VIA INFUSION
4000.0000 [IU] | Freq: Once | INTRAVENOUS | Status: AC
  Administered 2019-08-18: 4000 [IU] via INTRAVENOUS
  Filled 2019-08-18: qty 4000

## 2019-08-18 MED ORDER — ASPIRIN EC 81 MG PO TBEC
81.0000 mg | DELAYED_RELEASE_TABLET | Freq: Every day | ORAL | Status: DC
  Administered 2019-08-19 – 2019-08-23 (×5): 81 mg via ORAL
  Filled 2019-08-18 (×5): qty 1

## 2019-08-18 MED ORDER — METOPROLOL TARTRATE 25 MG PO TABS
12.5000 mg | ORAL_TABLET | Freq: Two times a day (BID) | ORAL | Status: DC
  Administered 2019-08-19 – 2019-08-20 (×2): 12.5 mg via ORAL
  Filled 2019-08-18 (×4): qty 1

## 2019-08-18 MED ORDER — SIMVASTATIN 20 MG PO TABS
40.0000 mg | ORAL_TABLET | Freq: Every day | ORAL | Status: DC
  Administered 2019-08-18 – 2019-08-22 (×5): 40 mg via ORAL
  Filled 2019-08-18 (×4): qty 2
  Filled 2019-08-18: qty 4

## 2019-08-18 MED ORDER — INSULIN ASPART 100 UNIT/ML ~~LOC~~ SOLN
0.0000 [IU] | Freq: Every day | SUBCUTANEOUS | Status: DC
  Administered 2019-08-18: 2 [IU] via SUBCUTANEOUS
  Filled 2019-08-18: qty 1

## 2019-08-18 MED ORDER — ACETAMINOPHEN 325 MG PO TABS: 650.0000 mg | ORAL_TABLET | ORAL | Status: DC | PRN

## 2019-08-18 MED ORDER — INSULIN ASPART 100 UNIT/ML ~~LOC~~ SOLN
0.0000 [IU] | Freq: Three times a day (TID) | SUBCUTANEOUS | Status: DC
  Administered 2019-08-19 – 2019-08-21 (×2): 3 [IU] via SUBCUTANEOUS
  Administered 2019-08-22 (×2): 2 [IU] via SUBCUTANEOUS
  Filled 2019-08-18 (×3): qty 1

## 2019-08-18 MED ORDER — ASPIRIN 81 MG PO CHEW
324.0000 mg | CHEWABLE_TABLET | Freq: Once | ORAL | Status: AC
  Administered 2019-08-18: 324 mg via ORAL
  Filled 2019-08-18: qty 4

## 2019-08-18 NOTE — Progress Notes (Signed)
[place] for heparin Indication: chest pain/ACS  Allergies  Allergen Reactions  . Losartan Other (See Comments)    Hyperkalemia  . Morphine And Related Shortness Of Breath    Rash, difficulty breathing, nausea.   . Atenolol Other (See Comments)    Other reaction(s): Other (See Comments) Decreased heart rate Decreased heart rate  . Codeine     Rash, difficulty breathing, nausea.  . Nsaids Other (See Comments)    Other reaction(s): Unknown  . Rofecoxib     Other reaction(s): Unknown  . Sucralfate Other (See Comments)    Throat tightness  . Sulfa Antibiotics     Other reaction(s): Unknown    Patient Measurements: Height: 5\' 2"  (157.5 cm) Weight: 77.1 kg (170 lb) IBW/kg (Calculated) : 50.1 Heparin Dosing Weight: 67 kg  Vital Signs: Temp: 97.5 F (36.4 C) (06/24 1654) Temp Source: Oral (06/24 1654) BP: 140/77 (06/24 1700) Pulse Rate: 85 (06/24 1700)  Labs: Recent Labs    08/18/19 1716  HGB 9.7*  HCT 28.9*  PLT 229  CREATININE 3.14*  TROPONINIHS 733*    Estimated Creatinine Clearance: 11.4 mL/min (A) (by C-G formula based on SCr of 3.14 mg/dL (H)).   Medical History: Past Medical History:  Diagnosis Date  . Bladder incontinence   . CAD (coronary artery disease)    a. 05/2009 Cath: LAD 90p (Xience 2.75 x 12 mm DES), 78m, D1 40, LCx 40p/m, RCA 30/40/30p/m, RCA 30/25d;  b.  12/2011 Lexiscan MV: no ischemia, breast attenuation artifact, normal EF-->Low risk; c. 10/2016 MV: fixed apical defect, most likely apical thinning and attenuation, No ischemia, EF 60%.  . Cancer ([place])    ovarian  . Carotid arterial disease w/ R Carotid Bruit (HCC)    a. 08/2016 Carotid U/S: 40-59% bilat ICA stenosis - f/u 1 yr.  . Chronic diastolic (congestive) heart failure ([place])    a. Echo 09/2015: EF 55-60% w/ Grade 1 DD, sev Ca2+ MV annulus, mildly dil LA; b. 09/2016 Echo: EF 65-70%, Gr2 DD, mildly dil LA/RA, nl RV fxn.  . Chronic Dyspnea on exertion    . CKD (chronic kidney disease) stage 3, GFR 30-59 ml/min   . Degenerative arthritis of knee    bilateral knees  . Diabetes mellitus    Type II  . GIB (gastrointestinal bleeding)    a. 08/2016 Admission w/ presyncope/anemia/melena-->req Transfusion-->endo ok, colonoscopy w/ polyps but no source of bleeding (most likely diverticular).  . Hiatal hernia   . Hypertension   . Iron deficiency   . Menopausal symptoms   . Morbid obesity ([place])   . Renal insufficiency   . Thyroid disease    hypothyroidism     Assessment: 86 year old female presented with weakness and a fall PTA. Pharmacy consulted for heparin drip.  Goal of Therapy:  Heparin level 0.3-0.7 units/ml Monitor platelets by anticoagulation protocol: Yes   Plan:  Heparin 4000 unit bolus followed by heparin drip at 800 units/hr. HL 6/25 at 0500. CBC daily while on heparin drip.  Tawnya Crook, PharmD 08/18/2019,7:26 PM

## 2019-08-18 NOTE — ED Notes (Signed)
Pt presents to the ED after a fall. Pt states she has been weak and fell in the bathroom. Denies LOC. Pt is c/o pain in the R side of the back of her head. Pt A&Ox4 and NAD. Pt is actively treated for shingles at this time.

## 2019-08-18 NOTE — ED Triage Notes (Signed)
Pt via EMS from home. Pt called out due to weakness, pt became weak and fell in the bathroom. Pt c/o head pain and R shoulder pain. Pt is on Plavix. Pt is also being treated for shingles currently. Pt is A&Ox4 and NAD. Per EMS, HR 80s in a-fib with hx of same, afebrile, 154/44.

## 2019-08-18 NOTE — ED Provider Notes (Signed)
[facility] Emergency Department Provider Note  ____________________________________________   I have reviewed the triage vital signs and the nursing notes.   HISTORY  Chief Complaint Weakness and Fall   History limited by: Not Limited   HPI Amy [name] is a 86 y.o. female who presents to the emergency department today because of concern for weakness and a fall.  The patient states that she was going to the bathroom when she felt weak.  She fell and hit her head.  She thinks she might of lost consciousness.  She also is complaining of right-sided pain where she fell.  She says that she was not able to get up because of a pre-existing knee condition.  Patient denies any chest pain or palpitations prior to the fall.  The patient is having some pain to the back of her head.  She states she is on Plavix.  She denies any recent fevers or illness, save for shingles on her right chest.   Records reviewed. Per medical record review patient has a history of recent admission for altered mental status.   Past Medical History:  Diagnosis Date   Bladder incontinence    CAD (coronary artery disease)    a. 05/2009 Cath: LAD 90p (Xience 2.75 x 12 mm DES), 67m, D1 40, LCx 40p/m, RCA 30/40/30p/m, RCA 30/25d;  b.  12/2011 Lexiscan MV: no ischemia, breast attenuation artifact, normal EF-->Low risk; c. 10/2016 MV: fixed apical defect, most likely apical thinning and attenuation, No ischemia, EF 60%.   Cancer [facility])    ovarian   Carotid arterial disease w/ R Carotid Bruit (HCC)    a. 08/2016 Carotid U/S: 40-59% bilat ICA stenosis - f/u 1 yr.   Chronic diastolic (congestive) heart failure ([place])    a. Echo 09/2015: EF 55-60% w/ Grade 1 DD, sev Ca2+ MV annulus, mildly dil LA; b. 09/2016 Echo: EF 65-70%, Gr2 DD, mildly dil LA/RA, nl RV fxn.   Chronic Dyspnea on exertion    CKD (chronic kidney disease) stage 3, GFR 30-59 ml/min    Degenerative arthritis of knee    bilateral  knees   Diabetes mellitus    Type II   GIB (gastrointestinal bleeding)    a. 08/2016 Admission w/ presyncope/anemia/melena-->req Transfusion-->endo ok, colonoscopy w/ polyps but no source of bleeding (most likely diverticular).   Hiatal hernia    Hypertension    Iron deficiency    Menopausal symptoms    Morbid obesity ([place])    Renal insufficiency    Thyroid disease    hypothyroidism    Patient Active Problem List   Diagnosis Date Noted   Syncope 08/13/2019   Unresponsiveness 08/12/2019   Hyperkalemia 08/12/2019   HTN (hypertension) 08/12/2019   GERD (gastroesophageal reflux disease) 08/12/2019   Hypothyroidism 08/12/2019   CAD (coronary artery disease) 08/12/2019   Shingles 08/12/2019   Carotid arterial disease (HCC)    Hypotension    Groin pain, right    Chest pain 08/06/2019   Renovascular hypertension 03/08/2019   Renal artery stenosis ([place]) 01/28/2019   Multiple renal cysts 12/29/2018   Renal stone 12/29/2018   Chronic kidney disease, stage IV (severe) ([place]) 11/23/2018   Diabetes ([place]) 06/29/2017   CAP (community acquired pneumonia) 05/16/2017   GI bleed 09/21/2016   Vitamin D deficiency 11/04/2015   Multiple lung nodules    Bronchitis with obstruction ([place])    New onset atrial fibrillation ([place]) 10/04/2015   Anemia 10/04/2015   Palliative care encounter 06/05/2015  Morbid obesity ([place]) 12/05/2014   Angina pectoris associated with type 2 diabetes mellitus ([place]) 12/05/2014   Type 2 diabetes mellitus with hyperglycemia ([place]) 08/22/2014   Chronic diastolic CHF (congestive heart failure) ([place]) 09/06/2013   Preop cardiovascular exam 06/03/2012   Iron deficiency anemia 09/08/2011   HLD (hyperlipidemia) 07/18/2009   Coronary artery disease involving native coronary artery of native heart without angina pectoris 07/18/2009   HYPOTHYROIDISM-IATROGENIC 07/12/2009   Essential hypertension 07/12/2009    Past Surgical History:   Procedure Laterality Date   COLONOSCOPY  2015   COLONOSCOPY WITH PROPOFOL N/A 09/25/2016   Procedure: COLONOSCOPY WITH PROPOFOL;  Surgeon: [name], MD;  Location: [facility] ENDOSCOPY;  Service: Gastroenterology;  Laterality: N/A;   COLONOSCOPY WITH PROPOFOL N/A 09/26/2016   Procedure: COLONOSCOPY WITH PROPOFOL;  Surgeon: [name], MD;  Location: [facility] ENDOSCOPY;  Service: Gastroenterology;  Laterality: N/A;   ESOPHAGOGASTRODUODENOSCOPY (EGD) WITH PROPOFOL N/A 09/25/2016   Procedure: ESOPHAGOGASTRODUODENOSCOPY (EGD) WITH PROPOFOL;  Surgeon: [name], MD;  Location: [facility] ENDOSCOPY;  Service: Gastroenterology;  Laterality: N/A;   RENAL ANGIOGRAPHY N/A 02/07/2019   Procedure: RENAL ANGIOGRAPHY;  Surgeon: [name], MD;  Location: [place] [facility];  Service: Cardiovascular;  Laterality: N/A;   REPLACEMENT TOTAL KNEE BILATERAL     TOTAL VAGINAL HYSTERECTOMY     ovarian mass, not cancerous   UPPER GI ENDOSCOPY  2015    Prior to Admission medications   Medication Sig Start Date End Date Taking? Authorizing Provider  acetaminophen (TYLENOL) 500 MG tablet Take 1 tablet (500 mg total) by mouth every 6 (six) hours as needed for mild pain or moderate pain. Over the counter 08/15/19   Rai, Vernelle Emerald, MD  albuterol (PROVENTIL HFA;VENTOLIN HFA) 108 (90 Base) MCG/ACT inhaler Inhale 2 puffs into the lungs every 6 (six) hours as needed for wheezing or shortness of breath.    [provider]  ALPRAZolam Duanne Moron) 0.25 MG tablet Take by mouth. 08/07/19   [provider]  budesonide (PULMICORT) 0.5 MG/2ML nebulizer solution Inhale into the lungs. 03/16/19 03/15/20  [provider]  calamine lotion Apply topically 3 (three) times daily. Apply to affected areas. Also available OTC. 08/15/19   Rai, Ripudeep K, MD  cloNIDine (CATAPRES - DOSED IN MG/24 HR) 0.2 mg/24hr patch Place 1 patch (0.2 mg total) onto the skin once a week. 06/29/17   Alisa Graff, FNP  clopidogrel (PLAVIX) 75  MG tablet Take 1 tablet (75 mg total) by mouth daily. 09/27/16   Dustin Flock, MD  Diclofenac Sodium 1 % CREA Apply 1 application topically 3 (three) times daily as needed. 08/07/19   Max Sane, MD  docusate sodium (COLACE) 100 MG capsule Take 100 mg by mouth daily as needed for mild constipation. Takes daily    [provider]  doxazosin (CARDURA) 8 MG tablet Take 1 tablet (8 mg total) by mouth daily. 03/15/19   Minna Merritts, MD  erythromycin ophthalmic ointment  06/14/18   [provider]  furosemide (LASIX) 20 MG tablet Take 1 tablet (20 mg) by mouth once daily as needed for weight gain/ shortness of breath/ swelling 04/05/18   Minna Merritts, MD  hydrALAZINE (APRESOLINE) 100 MG tablet Take 100 mg by mouth 3 (three) times daily. 05/21/19   [provider]  ipratropium-albuterol (DUONEB) 0.5-2.5 (3) MG/3ML SOLN Take 3 mLs by nebulization every 4 (four) hours as needed (shortness of breath/wheezing.). 10/11/15   Henreitta Leber, MD  levothyroxine (SYNTHROID) 88 MCG tablet  06/20/19   [provider]  montelukast (SINGULAIR) 10 MG tablet TAKE 1 TABLET BY MOUTH EVERY DAY AT NIGHT 03/24/19   [provider]  oxybutynin (DITROPAN-XL) 5 MG 24 hr tablet Take 5 mg by mouth daily.     [provider]  pantoprazole (PROTONIX) 40 MG tablet Take 20 mg by mouth daily.     [provider]  patiromer (VELTASSA) 8.4 g packet Take 3 packets (25.2 g total) by mouth daily. 08/16/19   Rai, Vernelle Emerald, MD  predniSONE (DELTASONE) 20 MG tablet Take 20 mg by mouth 2 (two) times daily. 08/12/19   [provider]  simvastatin (ZOCOR) 40 MG tablet Take 1 tablet (40 mg total) by mouth at bedtime. 03/22/18   Minna Merritts, MD  SYMBICORT 160-4.5 MCG/ACT inhaler Inhale 2 puffs into the lungs 2 (two) times daily.  11/15/14   [provider]  valACYclovir (VALTREX) 1000 MG tablet Take 1,000 mg by mouth 2 (two) times daily. 08/10/19   [provider]    Allergies Losartan, Morphine and related, Atenolol, Codeine, Nsaids, Rofecoxib, Sucralfate, and Sulfa antibiotics  Family History  Problem Relation Age of Onset   Breast cancer Mother 33    Social History Social History   Tobacco Use   Smoking status: Never Smoker   Smokeless tobacco: Never Used  Scientific laboratory technician Use: Never used  Substance Use Topics   Alcohol use: No   Drug use: No    Review of Systems Constitutional: No fever/chills Eyes: No visual changes. ENT: No sore throat. Cardiovascular: Denies chest pain. Respiratory: Denies shortness of breath. Gastrointestinal: No abdominal pain.  No nausea, no vomiting.  No diarrhea.   Genitourinary: Negative for dysuria. Musculoskeletal: Positive for right knee pain.  Skin: Negative for rash. Neurological: Positive for headache.  ____________________________________________   PHYSICAL EXAM:  VITAL SIGNS: ED Triage Vitals  Enc Vitals Group     BP 08/18/19 1700 140/77     Pulse Rate 08/18/19 1700 85     Resp 08/18/19 1654 20     Temp 08/18/19 1654 (!) 97.5 F (36.4 C)     Temp Source 08/18/19 1654 Oral     SpO2 08/18/19 1654 99 %     Weight 08/18/19 1656 170 lb (77.1 kg)     Height 08/18/19 1656 5\' 2"  (1.575 m)     Head Circumference --      Peak Flow --      Pain Score 08/18/19 1656 10   Constitutional: Alert and oriented.  Eyes: Conjunctivae are normal.  ENT      Head: Normocephalic and atraumatic.      Nose: No congestion/rhinnorhea.      Mouth/Throat: Mucous membranes are moist.      Neck: No stridor. Hematological/Lymphatic/Immunilogical: No cervical lymphadenopathy. Cardiovascular: Normal rate, regular rhythm.  No murmurs, rubs, or gallops.  Respiratory: Normal respiratory effort without tachypnea nor retractions. Breath sounds are clear and equal bilaterally. No wheezes/rales/rhonchi. Gastrointestinal: Soft and non tender. No rebound. No guarding.  Genitourinary:  Deferred Musculoskeletal: Normal range of motion in all extremities. No lower extremity edema. Neurologic:  Normal speech and language. No gross focal neurologic deficits are appreciated.  Skin:  Skin is warm, dry and intact. Shingles type rash to left chest.  Psychiatric: Mood and affect are normal. Speech and behavior are normal. Patient exhibits appropriate insight and judgment.  ____________________________________________    LABS (pertinent positives/negatives)  CBC wbc 11.4, hgb 9.7, plt 229 Trop  hs 733 BMP na 131, k 4.6, glu 284, cr 3.14 ____________________________________________   EKG  I, Nance Pear, attending physician, personally viewed and interpreted this EKG  EKG Time: 1656 Rate: 83 Rhythm: sinus rhythm Axis: right axis deviation Intervals: qtc 463 QRS: RBBB, LPFB ST changes: no st elevation Impression: abnormal ekg  ____________________________________________    RADIOLOGY  CT head/cervical spine No acute traumatic abnormality  Right rib series No acute abnormality ____________________________________________   PROCEDURES  Procedures  CRITICAL CARE Performed by: Nance Pear   Total critical care time: 30 minutes  Critical care time was exclusive of separately billable procedures and treating other patients.  Critical care was necessary to treat or prevent imminent or life-threatening deterioration.  Critical care was time spent personally by me on the following activities: development of treatment plan with patient and/or surrogate as well as nursing, discussions with consultants, evaluation of patient's response to treatment, examination of patient, obtaining history from patient or surrogate, ordering and performing treatments and interventions, ordering and review of laboratory studies, ordering and review of radiographic studies, pulse oximetry and re-evaluation of patient's  condition.  ____________________________________________   INITIAL IMPRESSION / ASSESSMENT AND PLAN / ED COURSE  Pertinent labs & imaging results that were available during my care of the patient were reviewed by me and considered in my medical decision making (see chart for details).  Patient presented to the emergency department today because of concerns for weakness and a fall.  Patient was complaining of some headache and right-sided pain.  Imaging of the CT head cervical spine and right ribs were negative.  Work-up however did show significantly elevated troponin.  EKG did not show ST elevation.  Did discuss this finding with patient and family.  Do have concerns for an NSTEMI.  Will discuss with hospitalist for admission.  ____________________________________________   FINAL CLINICAL IMPRESSION(S) / ED DIAGNOSES  Final diagnoses:  Weakness  Elevated troponin     Note: This dictation was prepared with Dragon dictation. Any transcriptional errors that result from this process are unintentional     Nance Pear, MD 08/18/19 1931

## 2019-08-18 NOTE — ED Notes (Signed)
Date and time results received: 08/18/19 1843  Test: Troponin Critical Value: 733  Name of Provider Notified: Dr. Archie Balboa, MD

## 2019-08-18 NOTE — ED Notes (Signed)
Pt was given a sandwich tray and a diet ginger ale.

## 2019-08-18 NOTE — ED Notes (Signed)
Daughter at bedside. Dressed with contact gown and mask. Daughter informed not to step in the hall with the gown on and informed to press the call bell.

## 2019-08-18 NOTE — ED Notes (Addendum)
Helped pt use bedpan to urinate. She was unsuccessful.

## 2019-08-18 NOTE — H&P (Addendum)
History and Physical    Amy [name] LZJ:[id number] DOB: 12-13-1927 DOA: 08/18/2019  PCP: [name], MD   Patient coming from: Home  I have personally briefly reviewed patient's old medical records in [place]  Chief Complaint: Weakness and fall  HPI: Amy [name] is a 86 y.o. female with medical history significant for COPD, HTN, diet-controlled DM, CKD 4, diastolic CHF, CAD with history of DES to LAD, recent shingles, recently hospitalized from 6/18 to 6/21 with a syncopal episode, and chest pain for which she received IV heparin, ruled out for ACS, evaluated by cardiology at that time, who presents to the emergency room following another episode in which she felt weak and fell hitting her head with possible loss of consciousness.  Aside from pain where she hit her head she denies other complaints.  She did have an episode of pain in her left arm a couple days prior which spontaneously resolved.  At this time she denies chest pain, shortness of breath, palpitations, lightheadedness, nausea vomiting or diaphoresis.  She does have residual right-sided chest pain from a recent shingles.  On her recent past hospitalization she did complain of right-sided chest pain radiating to her back but this is not a complaint on this visit.  She denies cough fever or chills.  She denies preceding headache, visual disturbance, one-sided weakness numbness or tingling. ED Course: On arrival, she was awake and alert and oriented.  Her vitals were within normal limits.  EKG showed atrial fibrillation with a right bundle branch block and.  VPCs.  Blood work was significant for creatinine of 3.14, above her baseline of 2.04, mild hyponatremia of 131, hemoglobin 9.7 which is about her baseline, and mildly elevated WBC at 11,000.  More notable however was troponin of 733.  During her recent past visit she had a flat trend with 3 troponins in the 20s.  Chest x-ray showed no acute disease.  Head and  C-spine CTs were unremarkable.  Patient was started on a heparin infusion.  Hospitalist consulted for admission.  Review of Systems: As per HPI otherwise all other systems on review of systems negative.    Past Medical History:  Diagnosis Date   Bladder incontinence    CAD (coronary artery disease)    a. 05/2009 Cath: LAD 90p (Xience 2.75 x 12 mm DES), 58m, D1 40, LCx 40p/m, RCA 30/40/30p/m, RCA 30/25d;  b.  12/2011 Lexiscan MV: no ischemia, breast attenuation artifact, normal EF-->Low risk; c. 10/2016 MV: fixed apical defect, most likely apical thinning and attenuation, No ischemia, EF 60%.   Cancer [facility])    ovarian   Carotid arterial disease w/ R Carotid Bruit (HCC)    a. 08/2016 Carotid U/S: 40-59% bilat ICA stenosis - f/u 1 yr.   Chronic diastolic (congestive) heart failure ([place])    a. Echo 09/2015: EF 55-60% w/ Grade 1 DD, sev Ca2+ MV annulus, mildly dil LA; b. 09/2016 Echo: EF 65-70%, Gr2 DD, mildly dil LA/RA, nl RV fxn.   Chronic Dyspnea on exertion    CKD (chronic kidney disease) stage 3, GFR 30-59 ml/min    Degenerative arthritis of knee    bilateral knees   Diabetes mellitus    Type II   GIB (gastrointestinal bleeding)    a. 08/2016 Admission w/ presyncope/anemia/melena-->req Transfusion-->endo ok, colonoscopy w/ polyps but no source of bleeding (most likely diverticular).   Hiatal hernia    Hypertension    Iron deficiency    Menopausal symptoms  Morbid obesity ([place])    Renal insufficiency    Thyroid disease    hypothyroidism    Past Surgical History:  Procedure Laterality Date   COLONOSCOPY  2015   COLONOSCOPY WITH PROPOFOL N/A 09/25/2016   Procedure: COLONOSCOPY WITH PROPOFOL;  Surgeon: [name], MD;  Location[facility] ENDOSCOPY;  Service: Gastroenterology;  Laterality: N/A;   COLONOSCOPY WITH PROPOFOL N/A 09/26/2016   Procedure: COLONOSCOPY WITH PROPOFOL;  Surgeon: [name], MD;  Location: [facility] ENDOSCOPY;  Service: Gastroenterology;  Laterality:  N/A;   ESOPHAGOGASTRODUODENOSCOPY (EGD) WITH PROPOFOL N/A 09/25/2016   Procedure: ESOPHAGOGASTRODUODENOSCOPY (EGD) WITH PROPOFOL;  Surgeon: [name], MD;  Location: [facility] ENDOSCOPY;  Service: Gastroenterology;  Laterality: N/A;   RENAL ANGIOGRAPHY N/A 02/07/2019   Procedure: RENAL ANGIOGRAPHY;  Surgeon: [name], MD;  Location: [facility] LAB;  Service: Cardiovascular;  Laterality: N/A;   REPLACEMENT TOTAL KNEE BILATERAL     TOTAL VAGINAL HYSTERECTOMY     ovarian mass, not cancerous   UPPER GI ENDOSCOPY  2015     reports that she has never smoked. She has never used smokeless tobacco. She reports that she does not drink alcohol and does not use drugs.  Allergies  Allergen Reactions   Losartan Other (See Comments)    Hyperkalemia   Morphine And Related Shortness Of Breath    Rash, difficulty breathing, nausea.    Atenolol Other (See Comments)    Other reaction(s): Other (See Comments) Decreased heart rate Decreased heart rate   Codeine     Rash, difficulty breathing, nausea.   Nsaids Other (See Comments)    Other reaction(s): Unknown   Rofecoxib     Other reaction(s): Unknown   Sucralfate Other (See Comments)    Throat tightness   Sulfa Antibiotics     Other reaction(s): Unknown    Family History  Problem Relation Age of Onset   Breast cancer Mother 46      Prior to Admission medications   Medication Sig Start Date End Date Taking? Authorizing Provider  acetaminophen (TYLENOL) 500 MG tablet Take 1 tablet (500 mg total) by mouth every 6 (six) hours as needed for mild pain or moderate pain. Over the counter 08/15/19   Rai, Vernelle Emerald, MD  albuterol (PROVENTIL HFA;VENTOLIN HFA) 108 (90 Base) MCG/ACT inhaler Inhale 2 puffs into the lungs every 6 (six) hours as needed for wheezing or shortness of breath.    [provider]  ALPRAZolam Duanne Moron) 0.25 MG tablet Take by mouth. 08/07/19   [provider]  budesonide (PULMICORT) 0.5 MG/2ML  nebulizer solution Inhale into the lungs. 03/16/19 03/15/20  [provider]  calamine lotion Apply topically 3 (three) times daily. Apply to affected areas. Also available OTC. 08/15/19   Rai, Ripudeep K, MD  cloNIDine (CATAPRES - DOSED IN MG/24 HR) 0.2 mg/24hr patch Place 1 patch (0.2 mg total) onto the skin once a week. 06/29/17   Alisa Graff, FNP  clopidogrel (PLAVIX) 75 MG tablet Take 1 tablet (75 mg total) by mouth daily. 09/27/16   Dustin Flock, MD  Diclofenac Sodium 1 % CREA Apply 1 application topically 3 (three) times daily as needed. 08/07/19   Max Sane, MD  docusate sodium (COLACE) 100 MG capsule Take 100 mg by mouth daily as needed for mild constipation. Takes daily    [provider]  doxazosin (CARDURA) 8 MG tablet Take 1 tablet (8 mg total) by mouth daily. 03/15/19   Minna Merritts, MD  erythromycin ophthalmic ointment  06/14/18   [provider]  furosemide (LASIX) 20 MG tablet Take 1 tablet (20 mg) by mouth once daily as needed for weight gain/ shortness of breath/ swelling 04/05/18   Minna Merritts, MD  hydrALAZINE (APRESOLINE) 100 MG tablet Take 100 mg by mouth 3 (three) times daily. 05/21/19   [provider]  ipratropium-albuterol (DUONEB) 0.5-2.5 (3) MG/3ML SOLN Take 3 mLs by nebulization every 4 (four) hours as needed (shortness of breath/wheezing.). 10/11/15   Henreitta Leber, MD  levothyroxine (SYNTHROID) 88 MCG tablet  06/20/19   [provider]  montelukast (SINGULAIR) 10 MG tablet TAKE 1 TABLET BY MOUTH EVERY DAY AT NIGHT 03/24/19   [provider]  oxybutynin (DITROPAN-XL) 5 MG 24 hr tablet Take 5 mg by mouth daily.     [provider]  pantoprazole (PROTONIX) 40 MG tablet Take 20 mg by mouth daily.     [provider]  patiromer (VELTASSA) 8.4 g packet Take 3 packets (25.2 g total) by mouth daily. 08/16/19   Rai, Vernelle Emerald, MD  predniSONE (DELTASONE) 20 MG tablet Take 20 mg by mouth 2 (two) times  daily. 08/12/19   [provider]  simvastatin (ZOCOR) 40 MG tablet Take 1 tablet (40 mg total) by mouth at bedtime. 03/22/18   Minna Merritts, MD  SYMBICORT 160-4.5 MCG/ACT inhaler Inhale 2 puffs into the lungs 2 (two) times daily.  11/15/14   [provider]  valACYclovir (VALTREX) 1000 MG tablet Take 1,000 mg by mouth 2 (two) times daily. 08/10/19   [provider]    Physical Exam: Vitals:   08/18/19 1654 08/18/19 1656 08/18/19 1700  BP:   140/77  Pulse:   85  Resp: 20    Temp: (!) 97.5 F (36.4 C)    TempSrc: Oral    SpO2: 99%    Weight:  77.1 kg   Height:  5\' 2"  (1.575 m)      Vitals:   08/18/19 1654 08/18/19 1656 08/18/19 1700  BP:   140/77  Pulse:   85  Resp: 20    Temp: (!) 97.5 F (36.4 C)    TempSrc: Oral    SpO2: 99%    Weight:  77.1 kg   Height:  5\' 2"  (1.575 m)       Constitutional:  Ill-appearing and lethargic but oriented x3. HEENT:      Head: Normocephalic and atraumatic.         Eyes: PERLA, EOMI, Conjunctivae are normal. Sclera is non-icteric.       Mouth/Throat: Mucous membranes are moist.       Neck: Supple with no signs of meningismus. Cardiovascular: Regular rate and rhythm. No murmurs, gallops, or rubs. 2+ symmetrical distal pulses are present . No JVD. No LE edema Respiratory: Respiratory effort normal .Lungs sounds clear bilaterally. No wheezes, crackles, or rhonchi.  Gastrointestinal: Soft, non tender, and non distended with positive bowel sounds. No rebound or guarding. Genitourinary: No CVA tenderness. Musculoskeletal: Nontender with normal range of motion in all extremities. No cyanosis, or erythema of extremities. Neurologic: Normal speech and language. Face is symmetric. Moving all extremities. No gross focal neurologic deficits . Skin: Skin is warm, dry. Healing shingles rash right T5 dermatome. Lesions do not appear active. They are crusted over and with mulltiple hyperpigmented spots of healed  lesions Psychiatric: Mood and affect are normal Speech and behavior are normal   Labs on Admission: I have personally reviewed following labs and imaging studies  CBC: Recent Labs  Lab 08/12/19 1300 08/13/19 1050 08/18/19 1716  WBC 8.9 8.3 11.4*  NEUTROABS 6.8  --   --   HGB 10.2* 9.7* 9.7*  HCT 30.6* 28.3* 28.9*  MCV 94.4 93.1 94.4  PLT 157 149* 315   Basic Metabolic Panel: Recent Labs  Lab 08/12/19 1300 08/12/19 1300 08/13/19 1050 08/14/19 0649 08/15/19 0709 08/17/19 1156 08/18/19 1716  NA 133*  --  133* 132* 133*  --  131*  K 6.0*   < > 5.2* 5.5* 5.1 4.9 4.6  CL 100  --  104 102 105  --  104  CO2 23  --  23 23 22   --  15*  GLUCOSE 148*  --  153* 192* 168*  --  284*  BUN 78*  --  74* 68* 68*  --  98*  CREATININE 2.99*  --  2.64* 2.32* 2.04*  --  3.14*  CALCIUM 8.6*  --  8.5* 8.6* 8.6*  --  8.9   < > = values in this interval not displayed.   GFR: Estimated Creatinine Clearance: 11.4 mL/min (A) (by C-G formula based on SCr of 3.14 mg/dL (H)). Liver Function Tests: Recent Labs  Lab 08/12/19 1300  AST 14*  ALT 11  ALKPHOS 58  BILITOT 0.7  PROT 5.2*  ALBUMIN 2.9*   No results for input(s): LIPASE, AMYLASE in the last 168 hours. Recent Labs  Lab 08/13/19 1722  AMMONIA <9*   Coagulation Profile: No results for input(s): INR, PROTIME in the last 168 hours. Cardiac Enzymes: No results for input(s): CKTOTAL, CKMB, CKMBINDEX, TROPONINI in the last 168 hours. BNP (last 3 results) No results for input(s): PROBNP in the last 8760 hours. HbA1C: No results for input(s): HGBA1C in the last 72 hours. CBG: Recent Labs  Lab 08/12/19 1301  GLUCAP 140*   Lipid Profile: No results for input(s): CHOL, HDL, LDLCALC, TRIG, CHOLHDL, LDLDIRECT in the last 72 hours. Thyroid Function Tests: No results for input(s): TSH, T4TOTAL, FREET4, T3FREE, THYROIDAB in the last 72 hours. Anemia Panel: No results for input(s): VITAMINB12, FOLATE, FERRITIN, TIBC, IRON, RETICCTPCT  in the last 72 hours. Urine analysis:    Component Value Date/Time   COLORURINE YELLOW (A) 08/12/2019 1830   APPEARANCEUR CLEAR (A) 08/12/2019 1830   APPEARANCEUR Cloudy (A) 03/15/2019 0909   LABSPEC 1.010 08/12/2019 1830   LABSPEC 1.025 10/11/2013 1637   PHURINE 6.0 08/12/2019 1830   GLUCOSEU NEGATIVE 08/12/2019 1830   GLUCOSEU Negative 10/11/2013 1637   HGBUR NEGATIVE 08/12/2019 1830   BILIRUBINUR NEGATIVE 08/12/2019 1830   BILIRUBINUR Negative 03/15/2019 0909   BILIRUBINUR Negative 10/11/2013 [place] 08/12/2019 1830   PROTEINUR NEGATIVE 08/12/2019 1830   NITRITE NEGATIVE 08/12/2019 1830   LEUKOCYTESUR NEGATIVE 08/12/2019 1830   LEUKOCYTESUR Negative 10/11/2013 1637    Radiological Exams on Admission: DG Ribs Unilateral W/Chest Right  Result Date: 08/18/2019 CLINICAL DATA:  86 year old female with fall. EXAM: RIGHT RIBS AND CHEST - 3+ VIEW COMPARISON:  Chest radiograph dated 08/06/2019. FINDINGS: No focal consolidation, pleural effusion, pneumothorax. Stable cardiomegaly. There is calcification of the mitral annulus. Atherosclerotic calcification of the aortic arch. Osteopenia with degenerative changes of the spine. No acute osseous pathology. No displaced rib fractures. IMPRESSION: 1. No acute cardiopulmonary process. 2. No displaced rib fracture or pneumothorax. Electronically Signed   By: Anner Crete M.D.   On: 08/18/2019 17:50   CT Head Wo Contrast  Result Date: 08/18/2019 CLINICAL DATA:  Weakness and subsequent fall.  EXAM: CT HEAD WITHOUT CONTRAST TECHNIQUE: Contiguous axial images were obtained from the base of the skull through the vertex without intravenous contrast. COMPARISON:  August 12, 2019 FINDINGS: Brain: There is mild cerebral atrophy with widening of the extra-axial spaces and ventricular dilatation. There are areas of decreased attenuation within the white matter tracts of the supratentorial brain, consistent with microvascular disease changes.  Vascular: No hyperdense vessel or unexpected calcification. Skull: Normal. Negative for fracture or focal lesion. Sinuses/Orbits: No acute finding. Other: None. IMPRESSION: 1. Generalized cerebral atrophy. 2. No acute intracranial abnormality. Electronically Signed   By: Virgina Norfolk M.D.   On: 08/18/2019 17:34   CT Cervical Spine Wo Contrast  Result Date: 08/18/2019 CLINICAL DATA:  Status post fall. EXAM: CT CERVICAL SPINE WITHOUT CONTRAST TECHNIQUE: Multidetector CT imaging of the cervical spine was performed without intravenous contrast. Multiplanar CT image reconstructions were also generated. COMPARISON:  None. FINDINGS: Alignment: Normal. Skull base and vertebrae: No acute fracture. No primary bone lesion or focal pathologic process. Soft tissues and spinal canal: No prevertebral fluid or swelling. No visible canal hematoma. Disc levels: Marked severity endplate sclerosis is seen at the levels of C5-C6 and C6-C7. Moderate to marked severity intervertebral disc space narrowing is also seen at these levels. Mild multilevel endplate sclerosis and mild intervertebral disc space narrowing is seen throughout the remainder of the cervical spine. There is moderate to marked severity bilateral multilevel facet joint hypertrophy. Upper chest: Moderate severity right apical scarring and/or atelectasis is seen. Pleural fluid is also seen along the right apex. Other: None. IMPRESSION: 1. No acute fracture within the cervical spine. 2. Marked severity degenerative changes at the levels of C5-C6 and C6-C7. 3. Moderate severity right apical scarring and/or atelectasis. 4. Right apical pleural fluid. Electronically Signed   By: Virgina Norfolk M.D.   On: 08/18/2019 17:40    EKG: Independently reviewed. Interpretation : EKG showed atrial fibrillation with a right bundle branch block and paired VPCs  Assessment/Plan Principal Problem:  86 year old female with multiple medical problems including CAD, recently  hospitalized for a syncopal episode returns with another syncopal episode.  Troponin 733.  NSTEMI (non-ST elevated myocardial infarction) ([place]) Coronary artery disease involving native coronary artery of native heart without angina pectoris -Patient presented with a syncopal episode, history of left arm pain 2 days prior, no acute changes on EKG, but troponin elevation of 733.  She was hospitalized on 08/12/2019 with more typical symptoms but with flat troponin trend in the 20s -Heparin infusion -Continue home aspirin, statin.  Started on metoprolol as no beta-blocker seen on home meds. -We will hold home Plavix to decrease risk of GI bleed -Cardiology consult in the a.m.    Syncope and collapse -Patient fell with loss of consciousness and this was preceded with weakness -Head and C-spine CT showing no acute injury -Suspicious for cardiac etiology.  Patient recently hospitalized a week ago with syncope -Continuous cardiac monitoring, treat NSTEMI as above and consult cardiology as above    Acute kidney injury superimposed on CKD stage IV (HCC) -Creatinine 3.14 elevated above baseline of 2.04 -IV hydration with normal saline -Monitor renal function and avoid nephrotoxins    Essential hypertension -Blood pressure controlled.  Continue home antihypertensives      COPD with chronic bronchitis ([place]) -Not acutely exacerbated.  Continue home inhalers with duo nebs as needed     Type 2 diabetes mellitus with hyperglycemia ([place]) -Patient is diet-controlled.  Sliding scale insulin coverage only    Hypothyroidism -  Continue levothyroxine  Shingles, right T 4 dermatome --resolving    DVT prophylaxis: Heparin infusion Code Status: DNR Family Communication:  none  Disposition Plan: Back to previous home environment Consults called: Cardiology  Status:obs    Athena Masse MD [facility]     08/18/2019, 7:32 PM

## 2019-08-19 DIAGNOSIS — K219 Gastro-esophageal reflux disease without esophagitis: Secondary | ICD-10-CM | POA: Diagnosis present

## 2019-08-19 DIAGNOSIS — Z20822 Contact with and (suspected) exposure to covid-19: Secondary | ICD-10-CM | POA: Diagnosis present

## 2019-08-19 DIAGNOSIS — R55 Syncope and collapse: Secondary | ICD-10-CM

## 2019-08-19 DIAGNOSIS — I248 Other forms of acute ischemic heart disease: Secondary | ICD-10-CM | POA: Diagnosis present

## 2019-08-19 DIAGNOSIS — W19XXXA Unspecified fall, initial encounter: Secondary | ICD-10-CM | POA: Diagnosis present

## 2019-08-19 DIAGNOSIS — E875 Hyperkalemia: Secondary | ICD-10-CM | POA: Diagnosis present

## 2019-08-19 DIAGNOSIS — Z66 Do not resuscitate: Secondary | ICD-10-CM | POA: Diagnosis present

## 2019-08-19 DIAGNOSIS — I4891 Unspecified atrial fibrillation: Secondary | ICD-10-CM | POA: Diagnosis present

## 2019-08-19 DIAGNOSIS — R531 Weakness: Secondary | ICD-10-CM | POA: Diagnosis present

## 2019-08-19 DIAGNOSIS — D638 Anemia in other chronic diseases classified elsewhere: Secondary | ICD-10-CM | POA: Diagnosis present

## 2019-08-19 DIAGNOSIS — E1165 Type 2 diabetes mellitus with hyperglycemia: Secondary | ICD-10-CM | POA: Diagnosis present

## 2019-08-19 DIAGNOSIS — J449 Chronic obstructive pulmonary disease, unspecified: Secondary | ICD-10-CM | POA: Diagnosis present

## 2019-08-19 DIAGNOSIS — I4510 Unspecified right bundle-branch block: Secondary | ICD-10-CM | POA: Diagnosis present

## 2019-08-19 DIAGNOSIS — E871 Hypo-osmolality and hyponatremia: Secondary | ICD-10-CM | POA: Diagnosis present

## 2019-08-19 DIAGNOSIS — N189 Chronic kidney disease, unspecified: Secondary | ICD-10-CM

## 2019-08-19 DIAGNOSIS — I130 Hypertensive heart and chronic kidney disease with heart failure and stage 1 through stage 4 chronic kidney disease, or unspecified chronic kidney disease: Secondary | ICD-10-CM | POA: Diagnosis present

## 2019-08-19 DIAGNOSIS — E039 Hypothyroidism, unspecified: Secondary | ICD-10-CM | POA: Diagnosis present

## 2019-08-19 DIAGNOSIS — N179 Acute kidney failure, unspecified: Secondary | ICD-10-CM | POA: Diagnosis present

## 2019-08-19 DIAGNOSIS — Z96653 Presence of artificial knee joint, bilateral: Secondary | ICD-10-CM | POA: Diagnosis present

## 2019-08-19 DIAGNOSIS — B029 Zoster without complications: Secondary | ICD-10-CM | POA: Diagnosis present

## 2019-08-19 DIAGNOSIS — E785 Hyperlipidemia, unspecified: Secondary | ICD-10-CM | POA: Diagnosis present

## 2019-08-19 DIAGNOSIS — E1122 Type 2 diabetes mellitus with diabetic chronic kidney disease: Secondary | ICD-10-CM | POA: Diagnosis present

## 2019-08-19 DIAGNOSIS — N184 Chronic kidney disease, stage 4 (severe): Secondary | ICD-10-CM | POA: Diagnosis present

## 2019-08-19 DIAGNOSIS — I5033 Acute on chronic diastolic (congestive) heart failure: Secondary | ICD-10-CM | POA: Diagnosis present

## 2019-08-19 DIAGNOSIS — I214 Non-ST elevation (NSTEMI) myocardial infarction: Secondary | ICD-10-CM | POA: Diagnosis present

## 2019-08-19 DIAGNOSIS — Z955 Presence of coronary angioplasty implant and graft: Secondary | ICD-10-CM | POA: Diagnosis not present

## 2019-08-19 DIAGNOSIS — I25119 Atherosclerotic heart disease of native coronary artery with unspecified angina pectoris: Secondary | ICD-10-CM | POA: Diagnosis present

## 2019-08-19 LAB — URINALYSIS, COMPLETE (UACMP) WITH MICROSCOPIC
Bilirubin Urine: NEGATIVE
Glucose, UA: NEGATIVE mg/dL
Hgb urine dipstick: NEGATIVE
Ketones, ur: NEGATIVE mg/dL
Leukocytes,Ua: NEGATIVE
Nitrite: NEGATIVE
Protein, ur: NEGATIVE mg/dL
Specific Gravity, Urine: 1.014 (ref 1.005–1.030)
pH: 5 (ref 5.0–8.0)

## 2019-08-19 LAB — HEPARIN LEVEL (UNFRACTIONATED)
Heparin Unfractionated: 0.95 IU/mL — ABNORMAL HIGH (ref 0.30–0.70)
Heparin Unfractionated: 0.5 IU/mL (ref 0.30–0.70)
Heparin Unfractionated: 0.37 IU/mL (ref 0.30–0.70)

## 2019-08-19 LAB — CBC
HCT: 25 % — ABNORMAL LOW (ref 36.0–46.0)
Hemoglobin: 8.4 g/dL — ABNORMAL LOW (ref 12.0–15.0)
MCH: 31.5 pg (ref 26.0–34.0)
MCHC: 33.6 g/dL (ref 30.0–36.0)
MCV: 93.6 fL (ref 80.0–100.0)
Platelets: 210 10*3/uL (ref 150–400)
RBC: 2.67 MIL/uL — ABNORMAL LOW (ref 3.87–5.11)
RDW: 14.1 % (ref 11.5–15.5)
WBC: 11.7 10*3/uL — ABNORMAL HIGH (ref 4.0–10.5)
nRBC: 0 % (ref 0.0–0.2)

## 2019-08-19 LAB — BASIC METABOLIC PANEL
Anion gap: 6 (ref 5–15)
BUN: 100 mg/dL — ABNORMAL HIGH (ref 8–23)
CO2: 19 mmol/L — ABNORMAL LOW (ref 22–32)
Calcium: 8.3 mg/dL — ABNORMAL LOW (ref 8.9–10.3)
Chloride: 112 mmol/L — ABNORMAL HIGH (ref 98–111)
Creatinine, Ser: 2.82 mg/dL — ABNORMAL HIGH (ref 0.44–1.00)
GFR calc Af Amer: 16 mL/min — ABNORMAL LOW (ref >60)
GFR calc non Af Amer: 14 mL/min — ABNORMAL LOW (ref >60)
Glucose, Bld: 127 mg/dL — ABNORMAL HIGH (ref 70–99)
Potassium: 4.3 mmol/L (ref 3.5–5.1)
Sodium: 137 mmol/L (ref 135–145)

## 2019-08-19 LAB — PROTIME-INR
INR: 1.3 — ABNORMAL HIGH (ref 0.8–1.2)
Prothrombin Time: 15.4 seconds — ABNORMAL HIGH (ref 11.4–15.2)

## 2019-08-19 LAB — GLUCOSE, CAPILLARY
Glucose-Capillary: 100 mg/dL — ABNORMAL HIGH (ref 70–99)
Glucose-Capillary: 159 mg/dL — ABNORMAL HIGH (ref 70–99)
Glucose-Capillary: 115 mg/dL — ABNORMAL HIGH (ref 70–99)
Glucose-Capillary: 174 mg/dL — ABNORMAL HIGH (ref 70–99)

## 2019-08-19 LAB — LIPID PANEL
Cholesterol: 110 mg/dL (ref 0–200)
HDL: 37 mg/dL — ABNORMAL LOW (ref >40)
LDL Cholesterol: 62 mg/dL (ref 0–99)
Total CHOL/HDL Ratio: 3 RATIO
Triglycerides: 55 mg/dL (ref <150)
VLDL: 11 mg/dL (ref 0–40)

## 2019-08-19 LAB — APTT: aPTT: >160 seconds (ref 24–36)

## 2019-08-19 LAB — HEMOGLOBIN A1C
Hgb A1c MFr Bld: 5.6 % (ref 4.8–5.6)
Mean Plasma Glucose: 114.02 mg/dL

## 2019-08-19 MED ORDER — PANTOPRAZOLE SODIUM 20 MG PO TBEC
20.0000 mg | DELAYED_RELEASE_TABLET | Freq: Every day | ORAL | Status: DC
  Administered 2019-08-19 – 2019-08-23 (×5): 20 mg via ORAL
  Filled 2019-08-19 (×5): qty 1

## 2019-08-19 MED ORDER — CALAMINE EX LOTN
TOPICAL_LOTION | Freq: Four times a day (QID) | CUTANEOUS | Status: DC
  Filled 2019-08-19: qty 180

## 2019-08-19 MED ORDER — FLUTICASONE FUROATE-VILANTEROL 200-25 MCG/INH IN AEPB
1.0000 | INHALATION_SPRAY | Freq: Every day | RESPIRATORY_TRACT | Status: DC
  Administered 2019-08-19 – 2019-08-23 (×5): 1 via RESPIRATORY_TRACT
  Filled 2019-08-19 (×2): qty 28

## 2019-08-19 MED ORDER — CLOPIDOGREL BISULFATE 75 MG PO TABS
75.0000 mg | ORAL_TABLET | Freq: Every day | ORAL | Status: DC
  Administered 2019-08-19 – 2019-08-23 (×5): 75 mg via ORAL
  Filled 2019-08-19 (×5): qty 1

## 2019-08-19 MED ORDER — ACETAMINOPHEN 500 MG PO TABS
1000.0000 mg | ORAL_TABLET | Freq: Three times a day (TID) | ORAL | Status: DC | PRN
  Administered 2019-08-19 – 2019-08-20 (×4): 1000 mg via ORAL
  Filled 2019-08-19 (×4): qty 2

## 2019-08-19 MED ORDER — LEVOTHYROXINE SODIUM 88 MCG PO TABS
88.0000 ug | ORAL_TABLET | Freq: Every day | ORAL | Status: DC
  Administered 2019-08-19 – 2019-08-23 (×5): 88 ug via ORAL
  Filled 2019-08-19 (×5): qty 1

## 2019-08-19 MED ORDER — OXYBUTYNIN CHLORIDE ER 5 MG PO TB24
5.0000 mg | ORAL_TABLET | Freq: Every day | ORAL | Status: DC
  Administered 2019-08-19 – 2019-08-23 (×5): 5 mg via ORAL
  Filled 2019-08-19 (×5): qty 1

## 2019-08-19 MED ORDER — VALACYCLOVIR HCL 500 MG PO TABS
1000.0000 mg | ORAL_TABLET | Freq: Two times a day (BID) | ORAL | Status: DC
  Administered 2019-08-19 – 2019-08-23 (×9): 1000 mg via ORAL
  Filled 2019-08-19 (×10): qty 2

## 2019-08-19 NOTE — Progress Notes (Signed)
Lab tech at bedside attempting to draw schedule PTT for heparin drip. Unable to collect. Second lab tech to return.

## 2019-08-19 NOTE — Progress Notes (Signed)
[place] for heparin Indication: chest pain/ACS  Allergies  Allergen Reactions  . Atenolol Other (See Comments)    Other reaction(s): Other (See Comments) Decreased heart rate Decreased heart rate  . Codeine Shortness Of Breath    Rash, difficulty breathing, nausea.  . Losartan Other (See Comments)    Hyperkalemia  . Morphine And Related Shortness Of Breath    Rash, difficulty breathing, nausea.   . Nsaids Other (See Comments)    Other reaction(s): Unknown  . Rofecoxib     Other reaction(s): Unknown  . Sucralfate Other (See Comments)    Throat tightness  . Sulfa Antibiotics     Other reaction(s): Unknown    Patient Measurements: Height: 5\' 2"  (157.5 cm) Weight: 77.1 kg (170 lb) IBW/kg (Calculated) : 50.1 Heparin Dosing Weight: 67 kg  Vital Signs: Temp: 97.7 F (36.5 C) (06/25 1607) Temp Source: Oral (06/25 1607) BP: 146/39 (06/25 1607) Pulse Rate: 53 (06/25 1607)  Labs: Recent Labs    08/18/19 1716 08/18/19 1908 08/18/19 2215 08/19/19 0641 08/19/19 1453  HGB 9.7*  --   --  8.4*  --   HCT 28.9*  --   --  25.0*  --   PLT 229  --   --  210  --   APTT  --   --  >160*  --   --   LABPROT  --   --  15.4*  --   --   INR  --   --  1.3*  --   --   HEPARINUNFRC  --   --   --  0.95* 0.50  CREATININE 3.14*  --   --  2.82*  --   TROPONINIHS 733* 724*  --   --   --     Estimated Creatinine Clearance: 12.7 mL/min (A) (by C-G formula based on SCr of 2.82 mg/dL (H)).    Assessment: 86 year old female presented with weakness and a fall PTA. Pharmacy consulted for heparin drip.  Heparin infusion started @ 800 units/hr  6/25 HL: 0.95. Will reduce infusion rate to 650 units/hr.   Goal of Therapy:  Heparin level 0.3-0.7 units/ml Monitor platelets by anticoagulation protocol: Yes   Plan:  6/25 1453 HL 0.50. Therapeutic x1. Continue heparin drip at current rate (650 units/hr).  Recheck HL in 8 hours to confirm.  CBC daily while  on heparin drip.  Pharmacy will continue to follow.   Rocky Morel, PharmD, BCPS Clinical Pharmacist 08/19/2019 6:08 PM

## 2019-08-19 NOTE — Progress Notes (Addendum)
Pt is requesting for calamine lotion for pt scabbed shingles. Talked to NP Ouma and states will place order. Will continue to monitor.  Update 2159: Pt have a scheduled metoprolol 12.5 mg  Pt BP at 143/35 MAP 67 and HR 55. Notify NP OUma and states to hold medicine at this time.. Will continue to monitor.

## 2019-08-19 NOTE — ED Notes (Signed)
Patient appropriate and cooperative. NAD noted

## 2019-08-19 NOTE — ED Notes (Signed)
Attempted to call report and was told by 2A secretary the bed has not been approved yet, left my number to give to nurse taking the bed

## 2019-08-19 NOTE — ED Notes (Signed)
Patient daughter at bedside.

## 2019-08-19 NOTE — Progress Notes (Signed)
   08/19/19 2338  Assess: MEWS Score  Temp 98.1 F (36.7 C)  BP (!) 143/40  Pulse Rate (!) 57  SpO2 99 %  Assess: MEWS Score  MEWS Temp 0  MEWS Systolic 0  MEWS Pulse 0  MEWS RR 0  MEWS LOC 0  MEWS Score 0  MEWS Score Color Green  Assess: if the MEWS score is Yellow or Red  Were vital signs taken at a resting state? Yes  Focused Assessment Documented focused assessment  Early Detection of Sepsis Score *See Row Information* Low  MEWS guidelines implemented *See Row Information* Yes  Treat  MEWS Interventions Other (Comment) (Hold the scheduled meds at this time)  Take Vital Signs  Increase Vital Sign Frequency  Yellow: Q 2hr X 2 then Q 4hr X 2, if remains yellow, continue Q 4hrs  Notify: Charge Nurse/RN  Name of Charge Nurse/RN Notified Adrienne, RN  Date Charge Nurse/RN Notified 08/19/19  Time Charge Nurse/RN Notified 2345

## 2019-08-19 NOTE — Progress Notes (Signed)
[place] for heparin Indication: chest pain/ACS  Allergies  Allergen Reactions  . Atenolol Other (See Comments)    Other reaction(s): Other (See Comments) Decreased heart rate Decreased heart rate  . Codeine Shortness Of Breath    Rash, difficulty breathing, nausea.  . Losartan Other (See Comments)    Hyperkalemia  . Morphine And Related Shortness Of Breath    Rash, difficulty breathing, nausea.   . Nsaids Other (See Comments)    Other reaction(s): Unknown  . Rofecoxib     Other reaction(s): Unknown  . Sucralfate Other (See Comments)    Throat tightness  . Sulfa Antibiotics     Other reaction(s): Unknown    Patient Measurements: Height: 5\' 2"  (157.5 cm) Weight: 77.1 kg (170 lb) IBW/kg (Calculated) : 50.1 Heparin Dosing Weight: 67 kg  Vital Signs: BP: 124/43 (06/25 0530) Pulse Rate: 63 (06/25 0530)  Labs: Recent Labs    08/18/19 1716 08/18/19 1908 08/18/19 2215 08/19/19 0641  HGB 9.7*  --   --  8.4*  HCT 28.9*  --   --  25.0*  PLT 229  --   --  210  APTT  --   --  >160*  --   LABPROT  --   --  15.4*  --   INR  --   --  1.3*  --   HEPARINUNFRC  --   --   --  0.95*  CREATININE 3.14*  --   --  2.82*  TROPONINIHS 733* 724*  --   --     Estimated Creatinine Clearance: 12.7 mL/min (A) (by C-G formula based on SCr of 2.82 mg/dL (H)).   Medical History: Past Medical History:  Diagnosis Date  . Bladder incontinence   . CAD (coronary artery disease)    a. 05/2009 Cath: LAD 90p (Xience 2.75 x 12 mm DES), 59m, D1 40, LCx 40p/m, RCA 30/40/30p/m, RCA 30/25d;  b.  12/2011 Lexiscan MV: no ischemia, breast attenuation artifact, normal EF-->Low risk; c. 10/2016 MV: fixed apical defect, most likely apical thinning and attenuation, No ischemia, EF 60%.  . Cancer ([place])    ovarian  . Carotid arterial disease w/ R Carotid Bruit (HCC)    a. 08/2016 Carotid U/S: 40-59% bilat ICA stenosis - f/u 1 yr.  . Chronic diastolic (congestive) heart  failure ([employer])    a. Echo 09/2015: EF 55-60% w/ Grade 1 DD, sev Ca2+ MV annulus, mildly dil LA; b. 09/2016 Echo: EF 65-70%, Gr2 DD, mildly dil LA/RA, nl RV fxn.  . Chronic Dyspnea on exertion   . CKD (chronic kidney disease) stage 3, GFR 30-59 ml/min   . Degenerative arthritis of knee    bilateral knees  . Diabetes mellitus    Type II  . GIB (gastrointestinal bleeding)    a. 08/2016 Admission w/ presyncope/anemia/melena-->req Transfusion-->endo ok, colonoscopy w/ polyps but no source of bleeding (most likely diverticular).  . Hiatal hernia   . Hypertension   . Iron deficiency   . Menopausal symptoms   . Morbid obesity ([place])   . Renal insufficiency   . Thyroid disease    hypothyroidism     Assessment: 86 year old female presented with weakness and a fall PTA. Pharmacy consulted for heparin drip.  Heparin infusion started @ 800 units/hr   Goal of Therapy:  Heparin level 0.3-0.7 units/ml Monitor platelets by anticoagulation protocol: Yes   Plan:  6/25 HL: 0.95. Level is supratherapeutic. Confirmed with RN that lab was draw appropriately.  Will reduce infusion rate to 650 units/hr.  Recheck HL in 6 hours.  CBC daily while on heparin drip.  Pernell Dupre, PharmD, BCPS Clinical Pharmacist 08/19/2019 7:23 AM

## 2019-08-19 NOTE — Consult Note (Signed)
Cardiology Consultation:   Patient ID: Amy [name]; [id number]; [birth year]   Admit date: 08/18/2019 Date of Consult: 08/19/2019  Primary Care Provider: [name], MD Primary Cardiologist: Rockey Situ Primary Electrophysiologist:  None   Patient Profile:   Amy [name] is a 86 y.o. female with a hx of CAD s/p prior LAD stenting in 2011, HFpEF, carotid artery disease, GI bleed, DM, CKD stage IV, HTN with RAS s/tp renal artery stenting in 01/2019, HLD, obesity, hypothyroidism, anemia of chronic disease/iron deficiency anemia, recently diagnosed shingles, and COPD with chronic dyspnea who is being seen today for the evaluation of elevated troponin at the request of Dr. Damita Dunnings.  History of Present Illness:   Amy [name] underwent remote LHC in 05/2009, which showed 90% proximal LAD stenosis s/p PCI/DES, proximal LAD-lesion 2 40% stenosis, mid LAD 40% stenosis, diagonal 40% stenosis, proximal LCx 60% stenosis followed by 40% stenosis, sequential 30%, 40%, 30% stenoses of the RCA with distal RCA 30% stenosis. Most recent ischemic evaluation in 03/2017 by [facility] which showed no significant ischemia, EF 56%, and was overall a low risk study.   She was admitted in 08/2017, with sepsis secondary to PNA, upper GI bleed with melena, and acute on chronic HFpEF. She was discharged on Plavix without ASA.   Beta blocker has previously been held secondary to bradycardia. ARB has been held secondary to hyperkalemia.   She was admitted 6/12-6/13 with atypical chest pain, felt to be MSK in etiology. Echo showed EF of 60-65%, Gr1DD, mild aortic stenosis. Cardiology was consulted who did not recommend further inpatient testing for atypical chest pain that was reproducible to palpation on exam. She was subsequently diagnosed with shingles along the right chest.   She was admitted to the hospital for a second time from 08/12/2019 to 08/15/2019 for unresponsive episode of uncertain etiology  with workup including CT head, orthostatics and echo being unrevealing outside of mild hyperkalemia of 6 which was treated with Veltassa. She was hypotensive upon presentation, which was felt to possibly be playing a role. There was some AMS, which was felt to be related to gabapentin and Percocet usage for recently diagnosed shingles.   She returned for a third admission this month on 6/24 weakness and a fall in which she hit her head. There is question of possible LOC. She continued to note right side chest pain and was otherwise without complaints outside of pain from hitting her head. Vitals were stable. EKG showed sinus rhythm with RBBB, PACs and PVCs as noted below. CT head/C-spine were not acute. CXR not acute. Labs showed an initial HS-Tn 733 with a delta 724, WBC 11.4, HGB 9.7-->8.4 (baseline 8-10), sodium 131, potassium 4.6-->4.3, BUN/SCr 98/3.14-->100/2.82 (baseline ~ 2), covid negative. In the ED, she was given ASA 324 mg x 1 as well as IV fluids and placed on a heparin gtt. Currently,     Past Medical History:  Diagnosis Date  . Bladder incontinence   . CAD (coronary artery disease)    a. 05/2009 Cath: LAD 90p (Xience 2.75 x 12 mm DES), 73m, D1 40, LCx 40p/m, RCA 30/40/30p/m, RCA 30/25d;  b.  12/2011 Lexiscan MV: no ischemia, breast attenuation artifact, normal EF-->Low risk; c. 10/2016 MV: fixed apical defect, most likely apical thinning and attenuation, No ischemia, EF 60%.  . Cancer ([place])    ovarian  . Carotid arterial disease w/ R Carotid Bruit ([place])    a. 08/2016 Carotid U/S: 40-59% bilat ICA stenosis - f/u  1 yr.  . Chronic diastolic (congestive) heart failure ([place])    a. Echo 09/2015: EF 55-60% w/ Grade 1 DD, sev Ca2+ MV annulus, mildly dil LA; b. 09/2016 Echo: EF 65-70%, Gr2 DD, mildly dil LA/RA, nl RV fxn.  . Chronic Dyspnea on exertion   . CKD (chronic kidney disease) stage 3, GFR 30-59 ml/min   . Degenerative arthritis of knee    bilateral knees  . Diabetes mellitus    Type  II  . GIB (gastrointestinal bleeding)    a. 08/2016 Admission w/ presyncope/anemia/melena-->req Transfusion-->endo ok, colonoscopy w/ polyps but no source of bleeding (most likely diverticular).  . Hiatal hernia   . Hypertension   . Iron deficiency   . Menopausal symptoms   . Morbid obesity ([place])   . Renal insufficiency   . Thyroid disease    hypothyroidism    Past Surgical History:  Procedure Laterality Date  . COLONOSCOPY  2015  . COLONOSCOPY WITH PROPOFOL N/A 09/25/2016   Procedure: COLONOSCOPY WITH PROPOFOL;  Surgeon: [name], MD;  Location: [facility] ENDOSCOPY;  Service: Gastroenterology;  Laterality: N/A;  . COLONOSCOPY WITH PROPOFOL N/A 09/26/2016   Procedure: COLONOSCOPY WITH PROPOFOL;  Surgeon: [name], MD;  Location: [facility] ENDOSCOPY;  Service: Gastroenterology;  Laterality: N/A;  . ESOPHAGOGASTRODUODENOSCOPY (EGD) WITH PROPOFOL N/A 09/25/2016   Procedure: ESOPHAGOGASTRODUODENOSCOPY (EGD) WITH PROPOFOL;  Surgeon: [name], MD;  Location: [facility] ENDOSCOPY;  Service: Gastroenterology;  Laterality: N/A;  . RENAL ANGIOGRAPHY N/A 02/07/2019   Procedure: RENAL ANGIOGRAPHY;  Surgeon: [name], MD;  Location: [place] CV LAB;  Service: Cardiovascular;  Laterality: N/A;  . REPLACEMENT TOTAL KNEE BILATERAL    . TOTAL VAGINAL HYSTERECTOMY     ovarian mass, not cancerous  . UPPER GI ENDOSCOPY  2015     Home Meds: Prior to Admission medications   Medication Sig Start Date End Date Taking? Authorizing Provider  acetaminophen (TYLENOL) 500 MG tablet Take 1 tablet (500 mg total) by mouth every 6 (six) hours as needed for mild pain or moderate pain. Over the counter 08/15/19  Yes Rai, Ripudeep K, MD  albuterol (PROVENTIL HFA;VENTOLIN HFA) 108 (90 Base) MCG/ACT inhaler Inhale 2 puffs into the lungs every 6 (six) hours as needed for wheezing or shortness of breath.   Yes [provider]  ALPRAZolam Duanne Moron) 0.25 MG tablet Take by mouth. 08/07/19  Yes [provider]    budesonide (PULMICORT) 0.5 MG/2ML nebulizer solution Inhale into the lungs. 03/16/19 03/15/20 Yes [provider]  calamine lotion Apply topically 3 (three) times daily. Apply to affected areas. Also available OTC. 08/15/19  Yes Rai, Ripudeep K, MD  cloNIDine (CATAPRES - DOSED IN MG/24 HR) 0.2 mg/24hr patch Place 1 patch (0.2 mg total) onto the skin once a week. 06/29/17  Yes Darylene Price A, FNP  clopidogrel (PLAVIX) 75 MG tablet Take 1 tablet (75 mg total) by mouth daily. 09/27/16  Yes Dustin Flock, MD  Diclofenac Sodium 1 % CREA Apply 1 application topically 3 (three) times daily as needed. 08/07/19  Yes Max Sane, MD  docusate sodium (COLACE) 100 MG capsule Take 100 mg by mouth daily as needed for mild constipation. Takes daily   Yes [provider]  doxazosin (CARDURA) 8 MG tablet Take 1 tablet (8 mg total) by mouth daily. 03/15/19  Yes Minna Merritts, MD  erythromycin ophthalmic ointment  06/14/18  Yes [provider]  furosemide (LASIX) 20 MG tablet Take 1 tablet (20 mg) by mouth once daily as  needed for weight gain/ shortness of breath/ swelling 04/05/18  Yes Gollan, Kathlene November, MD  hydrALAZINE (APRESOLINE) 100 MG tablet Take 100 mg by mouth 3 (three) times daily. 05/21/19  Yes [provider]  ipratropium-albuterol (DUONEB) 0.5-2.5 (3) MG/3ML SOLN Take 3 mLs by nebulization every 4 (four) hours as needed (shortness of breath/wheezing.). 10/11/15  Yes Henreitta Leber, MD  levothyroxine (SYNTHROID) 88 MCG tablet  06/20/19  Yes [provider]  montelukast (SINGULAIR) 10 MG tablet Take 10 mg by mouth at bedtime.  03/24/19  Yes [provider]  oxybutynin (DITROPAN-XL) 5 MG 24 hr tablet Take 5 mg by mouth daily.    Yes [provider]  pantoprazole (PROTONIX) 40 MG tablet Take 20 mg by mouth daily.    Yes [provider]  predniSONE (DELTASONE) 20 MG tablet Take 20 mg by mouth daily with breakfast.  08/12/19  Yes [provider]  simvastatin (ZOCOR) 40 MG tablet Take 1 tablet (40 mg total) by mouth at bedtime. 03/22/18  Yes Minna Merritts, MD  SYMBICORT 160-4.5 MCG/ACT inhaler Inhale 2 puffs into the lungs 2 (two) times daily.  11/15/14  Yes [provider]  valACYclovir (VALTREX) 1000 MG tablet Take 1,000 mg by mouth 2 (two) times daily. 08/10/19  Yes [provider]  patiromer (VELTASSA) 8.4 g packet Take 3 packets (25.2 g total) by mouth daily. Patient not taking: Reported on 08/18/2019 08/16/19   Mendel Corning, MD    Inpatient Medications: Scheduled Meds: . aspirin EC  81 mg Oral Daily  . clopidogrel  75 mg Oral Daily  . fluticasone furoate-vilanterol  1 puff Inhalation Daily  . insulin aspart  0-15 Units Subcutaneous TID WC  . insulin aspart  0-5 Units Subcutaneous QHS  . levothyroxine  88 mcg Oral Q0600  . metoprolol tartrate  12.5 mg Oral BID  . oxybutynin  5 mg Oral Daily  . pantoprazole  20 mg Oral Daily  . simvastatin  40 mg Oral QHS  . valACYclovir  1,000 mg Oral BID   Continuous Infusions: . heparin 650 Units/hr (08/19/19 0734)   PRN Meds: acetaminophen, ipratropium-albuterol, nitroGLYCERIN, ondansetron (ZOFRAN) IV  Allergies:   Allergies  Allergen Reactions  . Atenolol Other (See Comments)    Other reaction(s): Other (See Comments) Decreased heart rate Decreased heart rate  . Codeine Shortness Of Breath    Rash, difficulty breathing, nausea.  . Losartan Other (See Comments)    Hyperkalemia  . Morphine And Related Shortness Of Breath    Rash, difficulty breathing, nausea.   . Nsaids Other (See Comments)    Other reaction(s): Unknown  . Rofecoxib     Other reaction(s): Unknown  . Sucralfate Other (See Comments)    Throat tightness  . Sulfa Antibiotics     Other reaction(s): Unknown    Social History:   Social History   Socioeconomic History  . Marital status: Widowed    Spouse name: Not on file  . Number of children: Not on file  . Years  of education: Not on file  . Highest education level: Not on file  Occupational History  . Not on file  Tobacco Use  . Smoking status: Never Smoker  . Smokeless tobacco: Never Used  Vaping Use  . Vaping Use: Never used  Substance and Sexual Activity  . Alcohol use: No  . Drug use: No  . Sexual activity: Not on file  Other Topics Concern  . Not on file  Social  History Narrative  . Not on file   Social Determinants of Health   Financial Resource Strain:   . Difficulty of Paying Living Expenses:   Food Insecurity:   . Worried About Charity fundraiser in the Last Year:   . Arboriculturist in the Last Year:   Transportation Needs:   . Film/video editor (Medical):   Marland Kitchen Lack of Transportation (Non-Medical):   Physical Activity:   . Days of Exercise per Week:   . Minutes of Exercise per Session:   Stress:   . Feeling of Stress :   Social Connections:   . Frequency of Communication with Friends and Family:   . Frequency of Social Gatherings with Friends and Family:   . Attends Religious Services:   . Active Member of Clubs or Organizations:   . Attends Archivist Meetings:   Marland Kitchen Marital Status:   Intimate Partner Violence:   . Fear of Current or Ex-Partner:   . Emotionally Abused:   Marland Kitchen Physically Abused:   . Sexually Abused:      Family History:   Family History  Problem Relation Age of Onset  . Breast cancer Mother 64    ROS:  Review of Systems  Constitutional: Positive for malaise/fatigue. Negative for chills, diaphoresis, fever and weight loss.  HENT: Negative for congestion.   Eyes: Negative for discharge and redness.  Respiratory: Positive for shortness of breath. Negative for cough, hemoptysis, sputum production and wheezing.   Cardiovascular: Positive for chest pain. Negative for palpitations, orthopnea, claudication, leg swelling and PND.       Right sided chest pain  Gastrointestinal: Negative for abdominal pain, blood in stool, heartburn,  melena, nausea and vomiting.  Genitourinary: Negative for hematuria.  Musculoskeletal: Positive for back pain and falls. Negative for myalgias.  Skin: Positive for rash.  Neurological: Positive for weakness. Negative for dizziness, tingling, tremors, sensory change, speech change, focal weakness and loss of consciousness.  Endo/Heme/Allergies: Does not bruise/bleed easily.  Psychiatric/Behavioral: Negative for substance abuse. The patient is not nervous/anxious.   All other systems reviewed and are negative.     Physical Exam/Data:   Vitals:   08/19/19 0230 08/19/19 0500 08/19/19 0530 08/19/19 1029  BP: (!) 128/38 (!) 120/35 (!) 124/43 (!) 158/43  Pulse: 66 64 63 68  Resp: 16 12 16    Temp:      TempSrc:      SpO2: 95% 95% 98%   Weight:      Height:        Intake/Output Summary (Last 24 hours) at 08/19/2019 1044 Last data filed at 08/18/2019 2249 Gross per 24 hour  Intake 1000 ml  Output --  Net 1000 ml   Filed Weights   08/18/19 1656  Weight: 77.1 kg   Body mass index is 31.09 kg/m.   Physical Exam: General: Elderly and frail appearing, in no acute distress. Head: Normocephalic, atraumatic, sclera non-icteric, no xanthomas, nares without discharge.  Neck: Negative for carotid bruits. JVD not elevated. Lungs: Clear bilaterally to auscultation without wheezes, rales, or rhonchi. Breathing is unlabored. Rash with excoriations noted along the right chest. Heart: RRR with S1 S2. No murmurs, rubs, or gallops appreciated. Abdomen: Soft, non-tender, non-distended with normoactive bowel sounds. No hepatomegaly. No rebound/guarding. No obvious abdominal masses. Msk:  Strength and tone appear normal for age. Extremities: No clubbing or cyanosis. No edema. Distal pedal pulses are 2+ and equal bilaterally. Neuro: Alert and oriented X 3. No facial asymmetry. No  focal deficit. Moves all extremities spontaneously. Psych:  Responds to questions appropriately with a normal  affect.   EKG:  The EKG was personally reviewed and demonstrates: NSR, 83 bpm, bifascicular block with RBBB and LPFB, PACs and PVCs Telemetry:  Telemetry was personally reviewed and demonstrates: SR, BBB, PVC  Weights: Filed Weights   08/18/19 1656  Weight: 77.1 kg    Relevant CV Studies:  2D echo 08/14/2019: 1. Left ventricular ejection fraction, by estimation, is 60 to 65%. The  left ventricle has normal function. The left ventricle has no regional  wall motion abnormalities. There is moderate to severe left ventricular  hypertrophy. Left ventricular  diastolic parameters are consistent with Grade I diastolic dysfunction  (impaired relaxation).  2. Right ventricular systolic function is normal. The right ventricular  size is normal. Tricuspid regurgitation signal is inadequate for assessing  PA pressure.  3. Left atrial size was severely dilated  4. The aortic valve is normal in structure. Aortic valve regurgitation is  not visualized. Mild aortic valve stenosis. Aortic valve mean gradient  measures 17.0 mmHg. Aortic valve Vmax measures 2.74 m/s. __________  [facility] 04/23/2017: Pharmacological myocardial perfusion imaging study with no significant  Ischemia Small region of fixed apical thinning, consistent with attenuation artifact GI uptake artifact noted Normal wall motion, EF estimated at 56% No EKG changes concerning for ischemia at peak stress or in recovery. Low risk scan  Laboratory Data:  Chemistry Recent Labs  Lab 08/15/19 0709 08/15/19 0709 08/17/19 1156 08/18/19 1716 08/19/19 0641  NA 133*  --   --  131* 137  K 5.1   < > 4.9 4.6 4.3  CL 105  --   --  104 112*  CO2 22  --   --  15* 19*  GLUCOSE 168*  --   --  284* 127*  BUN 68*  --   --  98* 100*  CREATININE 2.04*  --   --  3.14* 2.82*  CALCIUM 8.6*  --   --  8.9 8.3*  GFRNONAA 21*  --   --  12* 14*  GFRAA 24*  --   --  14* 16*  ANIONGAP 6  --   --  12 6   < > = values in this interval not  displayed.    Recent Labs  Lab 08/12/19 1300  PROT 5.2*  ALBUMIN 2.9*  AST 14*  ALT 11  ALKPHOS 58  BILITOT 0.7   Hematology Recent Labs  Lab 08/13/19 1050 08/18/19 1716 08/19/19 0641  WBC 8.3 11.4* 11.7*  RBC 3.04* 3.06* 2.67*  HGB 9.7* 9.7* 8.4*  HCT 28.3* 28.9* 25.0*  MCV 93.1 94.4 93.6  MCH 31.9 31.7 31.5  MCHC 34.3 33.6 33.6  RDW 13.6 14.2 14.1  PLT 149* 229 210   Cardiac EnzymesNo results for input(s): TROPONINI in the last 168 hours. No results for input(s): TROPIPOC in the last 168 hours.  BNP Recent Labs  Lab 08/12/19 1300  BNP 58.8    DDimer No results for input(s): DDIMER in the last 168 hours.  Radiology/Studies:  DG Ribs Unilateral W/Chest Right  Result Date: 08/18/2019 IMPRESSION: 1. No acute cardiopulmonary process. 2. No displaced rib fracture or pneumothorax. Electronically Signed   By: Anner Crete M.D.   On: 08/18/2019 17:50   CT Head Wo Contrast  Result Date: 08/18/2019 IMPRESSION: 1. Generalized cerebral atrophy. 2. No acute intracranial abnormality. Electronically Signed   By: Virgina Norfolk M.D.   On: 08/18/2019 17:34  CT Cervical Spine Wo Contrast  Result Date: 08/18/2019 IMPRESSION: 1. No acute fracture within the cervical spine. 2. Marked severity degenerative changes at the levels of C5-C6 and C6-C7. 3. Moderate severity right apical scarring and/or atelectasis. 4. Right apical pleural fluid. Electronically Signed   By: Virgina Norfolk M.D.   On: 08/18/2019 17:40    Assessment and Plan:   1. Elevated troponin: -Mildly elevated and flat trending, not consistent ACS -Heparin gtt x 48 hours -No indication to repeat echo as this was just performed 08/14/2019 -Recommend VQ scan to evaluate for PE, deferred to IM -ASA/Plavix -Started on metoprolol in the ED, monitor as this has previously had to be discontinued secondary to bradycardia  -In the setting of flat trending HS-Tn, atypical right-sided chest pain with noted shingles,  acute on CKD stage IV and anemia would ideally like to avoid invasive ischemic evaluation at this time  2. Weakness with possible syncope: -CT head not acute -Recommend VQ scan -Monitor on telemetry  -If no significant arrhythmia is noted while admitted, consider Zio patch -Cannot exclude dehydration with acute on CKD or possible polypharmacy with the patient recently on gabapentin and Percocet as an outpatient with recent shingles (discontinued 4 days prior) -Recommend orthostatics   3. Acute on CKD stage IV: -Gentle hydration  -Avoid nephrotoxic medications   4. Acute on chronic anemia of chronic disease with history of GI bleed: -Likely playing a role in her overall presentation -Transfuse/IV iron as indicated per IM  5. HLD: -LDL 62 this admission -PTA statin    For questions or updates, please contact [place] Please consult [email or web address] for contact info under Cardiology/STEMI.   Signed, Christell Faith, PA-C [place] Pager: [phone] 08/19/2019, 10:44 AM

## 2019-08-19 NOTE — ED Notes (Signed)
Patient moved to 2A to room 232

## 2019-08-19 NOTE — ED Notes (Signed)
Transported to floor by Janett Billow, RN

## 2019-08-19 NOTE — Progress Notes (Signed)
PROGRESS NOTE    Amy [name]  IHK:[id number] DOB: Sep 09, 1927 DOA: 08/18/2019 PCP: [name], MD    Assessment & Plan:   Principal Problem:   NSTEMI (non-ST elevated myocardial infarction) [facility]) Active Problems:   Essential hypertension   Coronary artery disease involving native coronary artery of native heart without angina pectoris   Acute kidney injury superimposed on CKD ([place])   COPD with chronic bronchitis (HCC)   CKD (chronic kidney disease) stage 4, GFR 15-29 ml/min (HCC)   Type 2 diabetes mellitus with hyperglycemia ([place])   Hypothyroidism   Syncope and collapse    Amy [name] is a 86 y.o. Caucasian female with medical history significant for COPD, HTN, diet-controlled DM, CKD 4, diastolic CHF, CAD with history of DES to LAD, recent shingles, recently hospitalized from 6/18 to 6/21 with a syncopal episode, and chest pain for which she received IV heparin, ruled out for ACS, evaluated by cardiology at that time, who presents to the emergency room following another episode in which she felt weak and fell hitting her head with possible loss of consciousness.     Trop elevation due to demand ischemia Coronary artery disease involving native coronary artery of native heart without angina pectoris -Patient presented with a syncopal episode, history of left arm pain 2 days prior, no acute changes on EKG, but troponin elevation of 733.  She was hospitalized on 08/12/2019 with more typical symptoms but with flat troponin trend in the 20s --Recent echo 08/14/2019 showed normal LVEF PLAN: -Heparin infusion -Continue home aspirin, statin.   --continue metoprolol new -continue home ASA and Plavix -Cardiology consult today, who ruled out ACS --discharged with a 14-day event monitor, per cards.    Syncope and collapse -Patient fell with loss of consciousness and this was preceded with weakness -Head and C-spine CT showing no acute injury -Patient recently  hospitalized a week ago with syncope PLAN: -Continuous cardiac monitoring --obtain orthostatics --discharged with a 14-day event monitor, per cards.    Acute kidney injury superimposed on CKD stage IV (HCC) -Creatinine 3.14 elevated above baseline of 2.04 -d/c MIVF -Monitor renal function and avoid nephrotoxins    Essential hypertension -Blood pressure controlled.   Continue home antihypertensives      COPD with chronic bronchitis ([place]) -Not acutely exacerbated.  Continue home inhalers with duo nebs as needed     Type 2 diabetes mellitus with hyperglycemia ([place]) -Patient is diet-controlled.  Sliding scale insulin coverage only    Hypothyroidism -Continue levothyroxine  Shingles, right T 4 dermatome --continue home Valtrex   DVT prophylaxis: VF:IEPPIRJ gtt Code Status: DNR  Family Communication:  Status is: inpatient Dispo:   The patient is from: home Anticipated d/c is to: home Anticipated d/c date is: tomorrow Patient currently is not medically stable to d/c due to: still on heparin gtt   Subjective and Interval History:  Pt's main complaint was pain on her chest from shingles.  No cardiac chest pain.  Had dyspnea that's chronic.  No fever, abdominal pain, N/V/D.   Objective: Vitals:   08/19/19 2338 08/20/19 0138 08/20/19 0535 08/20/19 0744  BP: (!) 143/40 (!) 140/40 (!) 163/36 (!) 164/38  Pulse: (!) 57  (!) 59 64  Resp:  20 20 18   Temp: 98.1 F (36.7 C) 97.8 F (36.6 C) 98 F (36.7 C) 97.7 F (36.5 C)  TempSrc: Oral Oral Oral Oral  SpO2: 99% 99% 100% 100%  Weight:      Height:  Intake/Output Summary (Last 24 hours) at 08/20/2019 0954 Last data filed at 08/20/2019 0539 Gross per 24 hour  Intake 399.74 ml  Output 100 ml  Net 299.74 ml   Filed Weights   08/18/19 1656  Weight: 77.1 kg    Examination:   Constitutional: NAD, AAOx3 HEENT: conjunctivae and lids normal, EOMI CV: RRR 2+ systolic murmur. Distal pulses +2.  No cyanosis.    RESP: CTA B/L, normal respiratory effort  GI: +BS, NTND Extremities: No effusions, edema, or tenderness in BLE SKIN: warm, dry.  Groups of small scabs and small open ulcerations across right front chest, covered by clear film. Neuro: II - XII grossly intact.  Sensation intact Psych: Normal mood and affect.     Data Reviewed: I have personally reviewed following labs and imaging studies  CBC: Recent Labs  Lab 08/13/19 1050 08/18/19 1716 08/19/19 0641 08/20/19 0735  WBC 8.3 11.4* 11.7* 9.2  HGB 9.7* 9.7* 8.4* 9.0*  HCT 28.3* 28.9* 25.0* 27.1*  MCV 93.1 94.4 93.6 94.8  PLT 149* 229 210 009   Basic Metabolic Panel: Recent Labs  Lab 08/14/19 0649 08/14/19 0649 08/15/19 0709 08/17/19 1156 08/18/19 1716 08/19/19 0641 08/20/19 0449  NA 132*  --  133*  --  131* 137 137  K 5.5*   < > 5.1 4.9 4.6 4.3 4.9  CL 102  --  105  --  104 112* 112*  CO2 23  --  22  --  15* 19* 19*  GLUCOSE 192*  --  168*  --  284* 127* 111*  BUN 68*  --  68*  --  98* 100* 93*  CREATININE 2.32*  --  2.04*  --  3.14* 2.82* 2.31*  CALCIUM 8.6*  --  8.6*  --  8.9 8.3* 8.3*  MG  --   --   --   --   --   --  2.0   < > = values in this interval not displayed.   GFR: Estimated Creatinine Clearance: 15.6 mL/min (A) (by C-G formula based on SCr of 2.31 mg/dL (H)). Liver Function Tests: No results for input(s): AST, ALT, ALKPHOS, BILITOT, PROT, ALBUMIN in the last 168 hours. No results for input(s): LIPASE, AMYLASE in the last 168 hours. Recent Labs  Lab 08/13/19 1722  AMMONIA <9*   Coagulation Profile: Recent Labs  Lab 08/18/19 2215  INR 1.3*   Cardiac Enzymes: No results for input(s): CKTOTAL, CKMB, CKMBINDEX, TROPONINI in the last 168 hours. BNP (last 3 results) No results for input(s): PROBNP in the last 8760 hours. HbA1C: Recent Labs    08/18/19 2215  HGBA1C 5.6   CBG: Recent Labs  Lab 08/19/19 0905 08/19/19 1234 08/19/19 1646 08/19/19 2049 08/20/19 0742  GLUCAP 100* 159* 115*  174* 102*   Lipid Profile: Recent Labs    08/19/19 0641  CHOL 110  HDL 37*  LDLCALC 62  TRIG 55  CHOLHDL 3.0   Thyroid Function Tests: No results for input(s): TSH, T4TOTAL, FREET4, T3FREE, THYROIDAB in the last 72 hours. Anemia Panel: No results for input(s): VITAMINB12, FOLATE, FERRITIN, TIBC, IRON, RETICCTPCT in the last 72 hours. Sepsis Labs: No results for input(s): PROCALCITON, LATICACIDVEN in the last 168 hours.  Recent Results (from the past 240 hour(s))  SARS Coronavirus 2 by RT PCR (hospital order, performed in [name] hospital lab) Nasopharyngeal Nasopharyngeal Swab     Status: None   Collection Time: 08/12/19  2:39 PM   Specimen: Nasopharyngeal Swab  Result Value  Ref Range Status   SARS Coronavirus 2 NEGATIVE NEGATIVE Final    Comment: (NOTE) SARS-CoV-2 target nucleic acids are NOT DETECTED.  The SARS-CoV-2 RNA is generally detectable in upper and lower respiratory specimens during the acute phase of infection. The lowest concentration of SARS-CoV-2 viral copies this assay can detect is 250 copies / mL. A negative result does not preclude SARS-CoV-2 infection and should not be used as the sole basis for treatment or other patient management decisions.  A negative result may occur with improper specimen collection / handling, submission of specimen other than nasopharyngeal swab, presence of viral mutation(s) within the areas targeted by this assay, and inadequate number of viral copies (<250 copies / mL). A negative result must be combined with clinical observations, patient history, and epidemiological information.  Fact Sheet for Patients:   StrictlyIdeas.no  Fact Sheet for Healthcare Providers: BankingDealers.co.za  This test is not yet approved or  cleared by the [place] FDA and has been authorized for detection and/or diagnosis of SARS-CoV-2 by FDA under an Emergency Use Authorization (EUA).  This  EUA will remain in effect (meaning this test can be used) for the duration of the COVID-19 declaration under Section 564(b)(1) of the Act, 21 U.S.C. section 360bbb-3(b)(1), unless the authorization is terminated or revoked sooner.  Performed at [facility], [address], [place], [place] [address]   Urine Culture     Status: Abnormal   Collection Time: 08/13/19  6:30 PM   Specimen: Urine, Random  Result Value Ref Range Status   Specimen Description   Final    URINE, RANDOM Performed at [facility], [address] [place], [place] [address]    Special Requests   Final    NONE Performed at [facility], [address] [place], [place] [address]    Culture (A)  Final    70,000 COLONIES/mL MULTIPLE SPECIES PRESENT, SUGGEST RECOLLECTION   Report Status 08/15/2019 FINAL  Final  SARS Coronavirus 2 by RT PCR (hospital order, performed in [facility] hospital lab) Nasopharyngeal Nasopharyngeal Swab     Status: None   Collection Time: 08/18/19  7:08 PM   Specimen: Nasopharyngeal Swab  Result Value Ref Range Status   SARS Coronavirus 2 NEGATIVE NEGATIVE Final    Comment: (NOTE) SARS-CoV-2 target nucleic acids are NOT DETECTED.  The SARS-CoV-2 RNA is generally detectable in upper and lower respiratory specimens during the acute phase of infection. The lowest concentration of SARS-CoV-2 viral copies this assay can detect is 250 copies / mL. A negative result does not preclude SARS-CoV-2 infection and should not be used as the sole basis for treatment or other patient management decisions.  A negative result may occur with improper specimen collection / handling, submission of specimen other than nasopharyngeal swab, presence of viral mutation(s) within the areas targeted by this assay, and inadequate number of viral copies (<250 copies / mL). A negative result must be combined with clinical observations, patient history, and epidemiological  information.  Fact Sheet for Patients:   StrictlyIdeas.no  Fact Sheet for Healthcare Providers: BankingDealers.co.za  This test is not yet approved or  cleared by the [place] FDA and has been authorized for detection and/or diagnosis of SARS-CoV-2 by FDA under an Emergency Use Authorization (EUA).  This EUA will remain in effect (meaning this test can be used) for the duration of the COVID-19 declaration under Section 564(b)(1) of the Act, 21 U.S.C. section 360bbb-3(b)(1), unless the authorization is terminated or revoked  sooner.  Performed at [facility], [address] [place], [place] [address]       Radiology Studies: DG Ribs Unilateral W/Chest Right  Result Date: 08/18/2019 CLINICAL DATA:  86 year old female with fall. EXAM: RIGHT RIBS AND CHEST - 3+ VIEW COMPARISON:  Chest radiograph dated 08/06/2019. FINDINGS: No focal consolidation, pleural effusion, pneumothorax. Stable cardiomegaly. There is calcification of the mitral annulus. Atherosclerotic calcification of the aortic arch. Osteopenia with degenerative changes of the spine. No acute osseous pathology. No displaced rib fractures. IMPRESSION: 1. No acute cardiopulmonary process. 2. No displaced rib fracture or pneumothorax. Electronically Signed   By: Anner Crete M.D.   On: 08/18/2019 17:50   CT Head Wo Contrast  Result Date: 08/18/2019 CLINICAL DATA:  Weakness and subsequent fall. EXAM: CT HEAD WITHOUT CONTRAST TECHNIQUE: Contiguous axial images were obtained from the base of the skull through the vertex without intravenous contrast. COMPARISON:  August 12, 2019 FINDINGS: Brain: There is mild cerebral atrophy with widening of the extra-axial spaces and ventricular dilatation. There are areas of decreased attenuation within the white matter tracts of the supratentorial brain, consistent with microvascular disease changes. Vascular: No hyperdense vessel or  unexpected calcification. Skull: Normal. Negative for fracture or focal lesion. Sinuses/Orbits: No acute finding. Other: None. IMPRESSION: 1. Generalized cerebral atrophy. 2. No acute intracranial abnormality. Electronically Signed   By: Virgina Norfolk M.D.   On: 08/18/2019 17:34   CT Cervical Spine Wo Contrast  Result Date: 08/18/2019 CLINICAL DATA:  Status post fall. EXAM: CT CERVICAL SPINE WITHOUT CONTRAST TECHNIQUE: Multidetector CT imaging of the cervical spine was performed without intravenous contrast. Multiplanar CT image reconstructions were also generated. COMPARISON:  None. FINDINGS: Alignment: Normal. Skull base and vertebrae: No acute fracture. No primary bone lesion or focal pathologic process. Soft tissues and spinal canal: No prevertebral fluid or swelling. No visible canal hematoma. Disc levels: Marked severity endplate sclerosis is seen at the levels of C5-C6 and C6-C7. Moderate to marked severity intervertebral disc space narrowing is also seen at these levels. Mild multilevel endplate sclerosis and mild intervertebral disc space narrowing is seen throughout the remainder of the cervical spine. There is moderate to marked severity bilateral multilevel facet joint hypertrophy. Upper chest: Moderate severity right apical scarring and/or atelectasis is seen. Pleural fluid is also seen along the right apex. Other: None. IMPRESSION: 1. No acute fracture within the cervical spine. 2. Marked severity degenerative changes at the levels of C5-C6 and C6-C7. 3. Moderate severity right apical scarring and/or atelectasis. 4. Right apical pleural fluid. Electronically Signed   By: Virgina Norfolk M.D.   On: 08/18/2019 17:40     Scheduled Meds:  aspirin EC  81 mg Oral Daily   calamine   Topical QID   clopidogrel  75 mg Oral Daily   fluticasone furoate-vilanterol  1 puff Inhalation Daily   insulin aspart  0-15 Units Subcutaneous TID WC   insulin aspart  0-5 Units Subcutaneous QHS    levothyroxine  88 mcg Oral Q0600   metoprolol tartrate  12.5 mg Oral BID   oxybutynin  5 mg Oral Daily   pantoprazole  20 mg Oral Daily   simvastatin  40 mg Oral QHS   valACYclovir  1,000 mg Oral BID   Continuous Infusions:   LOS: 1 day     Enzo Bi, MD [facility] If 7PM-7AM, please contact night-coverage 08/20/2019, 9:54 AM

## 2019-08-19 NOTE — Progress Notes (Signed)
Pt noted to have dried, scabbed shingles rash to right breast and armpit area. Infectious Disease Nurse Moshe Salisbury notified. Pt okay to be in standard precautions as long as rash is dry and scabbed over. Protective tegaderm dressing applied to areas.

## 2019-08-20 DIAGNOSIS — I214 Non-ST elevation (NSTEMI) myocardial infarction: Principal | ICD-10-CM

## 2019-08-20 LAB — GLUCOSE, CAPILLARY
Glucose-Capillary: 102 mg/dL — ABNORMAL HIGH (ref 70–99)
Glucose-Capillary: 147 mg/dL — ABNORMAL HIGH (ref 70–99)
Glucose-Capillary: 120 mg/dL — ABNORMAL HIGH (ref 70–99)
Glucose-Capillary: 118 mg/dL — ABNORMAL HIGH (ref 70–99)

## 2019-08-20 LAB — CBC
HCT: 27.1 % — ABNORMAL LOW (ref 36.0–46.0)
Hemoglobin: 9 g/dL — ABNORMAL LOW (ref 12.0–15.0)
MCH: 31.5 pg (ref 26.0–34.0)
MCHC: 33.2 g/dL (ref 30.0–36.0)
MCV: 94.8 fL (ref 80.0–100.0)
Platelets: 213 10*3/uL (ref 150–400)
RBC: 2.86 MIL/uL — ABNORMAL LOW (ref 3.87–5.11)
RDW: 14.3 % (ref 11.5–15.5)
WBC: 9.2 10*3/uL (ref 4.0–10.5)
nRBC: 0 % (ref 0.0–0.2)

## 2019-08-20 LAB — BASIC METABOLIC PANEL
Anion gap: 6 (ref 5–15)
BUN: 93 mg/dL — ABNORMAL HIGH (ref 8–23)
CO2: 19 mmol/L — ABNORMAL LOW (ref 22–32)
Calcium: 8.3 mg/dL — ABNORMAL LOW (ref 8.9–10.3)
Chloride: 112 mmol/L — ABNORMAL HIGH (ref 98–111)
Creatinine, Ser: 2.31 mg/dL — ABNORMAL HIGH (ref 0.44–1.00)
GFR calc Af Amer: 21 mL/min — ABNORMAL LOW (ref >60)
GFR calc non Af Amer: 18 mL/min — ABNORMAL LOW (ref >60)
Glucose, Bld: 111 mg/dL — ABNORMAL HIGH (ref 70–99)
Potassium: 4.9 mmol/L (ref 3.5–5.1)
Sodium: 137 mmol/L (ref 135–145)

## 2019-08-20 LAB — HEPARIN LEVEL (UNFRACTIONATED): Heparin Unfractionated: 0.38 IU/mL (ref 0.30–0.70)

## 2019-08-20 LAB — MAGNESIUM: Magnesium: 2 mg/dL (ref 1.7–2.4)

## 2019-08-20 MED ORDER — HEPARIN SODIUM (PORCINE) 5000 UNIT/ML IJ SOLN
5000.0000 [IU] | Freq: Three times a day (TID) | INTRAMUSCULAR | Status: DC
  Administered 2019-08-20 – 2019-08-23 (×9): 5000 [IU] via SUBCUTANEOUS
  Filled 2019-08-20 (×9): qty 1

## 2019-08-20 MED ORDER — ACETAMINOPHEN 500 MG PO TABS
1000.0000 mg | ORAL_TABLET | Freq: Three times a day (TID) | ORAL | Status: DC
  Administered 2019-08-21 – 2019-08-23 (×9): 1000 mg via ORAL
  Filled 2019-08-20 (×9): qty 2

## 2019-08-20 MED ORDER — HYDRALAZINE HCL 50 MG PO TABS
50.0000 mg | ORAL_TABLET | Freq: Three times a day (TID) | ORAL | Status: DC
  Administered 2019-08-20: 50 mg via ORAL
  Filled 2019-08-20: qty 1

## 2019-08-20 NOTE — Progress Notes (Signed)
[facility] for heparin Indication: chest pain/ACS  Allergies  Allergen Reactions  . Atenolol Other (See Comments)    Other reaction(s): Other (See Comments) Decreased heart rate Decreased heart rate  . Codeine Shortness Of Breath    Rash, difficulty breathing, nausea.  . Losartan Other (See Comments)    Hyperkalemia  . Morphine And Related Shortness Of Breath    Rash, difficulty breathing, nausea.   . Nsaids Other (See Comments)    Other reaction(s): Unknown  . Rofecoxib     Other reaction(s): Unknown  . Sucralfate Other (See Comments)    Throat tightness  . Sulfa Antibiotics     Other reaction(s): Unknown    Patient Measurements: Height: 5\' 2"  (157.5 cm) Weight: 77.1 kg (170 lb) IBW/kg (Calculated) : 50.1 Heparin Dosing Weight: 67 kg  Vital Signs: Temp: 97.7 F (36.5 C) (06/26 0744) Temp Source: Oral (06/26 0744) BP: 164/38 (06/26 0744) Pulse Rate: 64 (06/26 0744)  Labs: Recent Labs    08/18/19 1716 08/18/19 1716 08/18/19 1908 08/18/19 2215 08/19/19 0641 08/19/19 0641 08/19/19 1453 08/19/19 2301 08/20/19 0449 08/20/19 0735  HGB 9.7*   < >  --   --  8.4*  --   --   --   --  9.0*  HCT 28.9*  --   --   --  25.0*  --   --   --   --  27.1*  PLT 229  --   --   --  210  --   --   --   --  213  APTT  --   --   --  >160*  --   --   --   --   --   --   LABPROT  --   --   --  15.4*  --   --   --   --   --   --   INR  --   --   --  1.3*  --   --   --   --   --   --   HEPARINUNFRC  --   --   --   --  0.95*   < > 0.50 0.37  --  0.38  CREATININE 3.14*  --   --   --  2.82*  --   --   --  2.31*  --   TROPONINIHS 733*  --  724*  --   --   --   --   --   --   --    < > = values in this interval not displayed.    Estimated Creatinine Clearance: 15.6 mL/min (A) (by C-G formula based on SCr of 2.31 mg/dL (H)).    Assessment: 86 year old female presented with weakness and a fall PTA. Pharmacy consulted for heparin drip.  Heparin  infusion started @ 800 units/hr  6/25 HL: 0.95. Will reduce infusion rate to 650 units/hr.  6/25 2301 HL 0.37. Therapeutic x 2. Continue heparin drip at current rate (650 units/hr).    Goal of Therapy:  Heparin level 0.3-0.7 units/ml Monitor platelets by anticoagulation protocol: Yes   Plan:  6/26 0735 HL 0.38. Therapeutic. Continue heparin drip at current rate (650 units/hr).  Recheck HL in am and CBC daily while on heparin drip.  Pharmacy will continue to follow.   Noralee Space, PharmD Clinical Pharmacist 08/20/2019 8:38 AM

## 2019-08-20 NOTE — Progress Notes (Signed)
PROGRESS NOTE    Amy [name]  ALP:[id number] DOB: 1927/07/07 DOA: 08/18/2019 PCP: [name], MD    Assessment & Plan:   Principal Problem:   NSTEMI (non-ST elevated myocardial infarction) [facility]) Active Problems:   Essential hypertension   Coronary artery disease involving native coronary artery of native heart without angina pectoris   Acute kidney injury superimposed on CKD ([place])   COPD with chronic bronchitis (HCC)   CKD (chronic kidney disease) stage 4, GFR 15-29 ml/min (HCC)   Type 2 diabetes mellitus with hyperglycemia ([place])   Hypothyroidism   Syncope and collapse    Amy [name] is a 86 y.o. Caucasian female with medical history significant for COPD, HTN, diet-controlled DM, CKD 4, diastolic CHF, CAD with history of DES to LAD, recent shingles, recently hospitalized from 6/18 to 6/21 with a syncopal episode, and chest pain for which she received IV heparin, ruled out for ACS, evaluated by cardiology at that time, who presents to the emergency room following another episode in which she felt weak and fell hitting her head with possible loss of consciousness.     Trop elevation due to demand ischemia Coronary artery disease involving native coronary artery of native heart without angina pectoris -Patient presented with a syncopal episode, history of left arm pain 2 days prior, no acute changes on EKG, but troponin elevation of 733.  She was hospitalized on 08/12/2019 with more typical symptoms but with flat troponin trend in the 20s --Recent echo 08/14/2019 showed normal -Cardiology consulted, who ruled out ACS LVEF PLAN: -d/c Heparin infusion -Continue home statin.   --d/c metoprolol due to hx of associated bradycardia  -continue home ASA and Plavix --discharged with a 14-day event monitor, per cards.    Syncope and collapse -Patient fell with loss of consciousness and this was preceded with weakness -Head and C-spine CT showing no acute injury -Patient  recently hospitalized a week ago with syncope PLAN: -Continuous cardiac monitoring --obtain orthostatics --discharged with a 14-day event monitor, per cards. --PT    Acute kidney injury superimposed on CKD stage IV (HCC) -Creatinine 3.14 elevated above baseline of 2.04 --MIVF d/c'ed -Monitor renal function and avoid nephrotoxins    Essential hypertension -Home BP meds were held PTA due to soft pressures.  --Resume hydralazine at 50 mg q8h due to now elevated BP      COPD with chronic bronchitis ([place]) -Not acutely exacerbated.  Continue home inhalers with duo nebs as needed     Type 2 diabetes mellitus with hyperglycemia ([place]) -Patient is diet-controlled.  Sliding scale insulin coverage only    Hypothyroidism -Continue levothyroxine  Pain with Shingles, right T 4 dermatome --continue home Valtrex --tylenol 1g TID scheduled   DVT prophylaxis: Heparin SQ Code Status: DNR  Family Communication:  Status is: inpatient Dispo:   The patient is from: home Anticipated d/c is to: undetermined Anticipated d/c date is: 1-2 days, pending disposition Patient currently is not medically stable to d/c due to: pending PT eval for disposition, PT couldn't be performed today due to significant BP swings.    Subjective and Interval History:  Pt again mainly complained about pain from her shingles.  Reported dyspnea.  No fever, abdominal pain, N/V/D.   Objective: Vitals:   08/20/19 0535 08/20/19 0744 08/20/19 1117 08/20/19 1128  BP: (!) 163/36 (!) 164/38 139/83 94/64  Pulse: (!) 59 64 61 81  Resp: 20 18    Temp: 98 F (36.7 C) 97.7 F (36.5 C) 97.9 F (  36.6 C)   TempSrc: Oral Oral Oral   SpO2: 100% 100% 94% 99%  Weight:      Height:        Intake/Output Summary (Last 24 hours) at 08/20/2019 1539 Last data filed at 08/20/2019 1235 Gross per 24 hour  Intake 606.5 ml  Output 200 ml  Net 406.5 ml   Filed Weights   08/18/19 1656  Weight: 77.1 kg    Examination:    Constitutional: NAD, AAOx3 HEENT: conjunctivae and lids normal, EOMI CV: RRR 2+ systolic murmur. Distal pulses +2.  No cyanosis.   RESP: CTA B/L, normal respiratory effort  GI: +BS, NTND Extremities: No effusions, edema, or tenderness in BLE SKIN: warm, dry.  Groups of small scabs and across right front chest Neuro: II - XII grossly intact.  Sensation intact Psych: depressed mood and affect.     Data Reviewed: I have personally reviewed following labs and imaging studies  CBC: Recent Labs  Lab 08/18/19 1716 08/19/19 0641 08/20/19 0735  WBC 11.4* 11.7* 9.2  HGB 9.7* 8.4* 9.0*  HCT 28.9* 25.0* 27.1*  MCV 94.4 93.6 94.8  PLT 229 210 440   Basic Metabolic Panel: Recent Labs  Lab 08/14/19 0649 08/14/19 0649 08/15/19 0709 08/17/19 1156 08/18/19 1716 08/19/19 0641 08/20/19 0449  NA 132*  --  133*  --  131* 137 137  K 5.5*   < > 5.1 4.9 4.6 4.3 4.9  CL 102  --  105  --  104 112* 112*  CO2 23  --  22  --  15* 19* 19*  GLUCOSE 192*  --  168*  --  284* 127* 111*  BUN 68*  --  68*  --  98* 100* 93*  CREATININE 2.32*  --  2.04*  --  3.14* 2.82* 2.31*  CALCIUM 8.6*  --  8.6*  --  8.9 8.3* 8.3*  MG  --   --   --   --   --   --  2.0   < > = values in this interval not displayed.   GFR: Estimated Creatinine Clearance: 15.6 mL/min (A) (by C-G formula based on SCr of 2.31 mg/dL (H)). Liver Function Tests: No results for input(s): AST, ALT, ALKPHOS, BILITOT, PROT, ALBUMIN in the last 168 hours. No results for input(s): LIPASE, AMYLASE in the last 168 hours. Recent Labs  Lab 08/13/19 1722  AMMONIA <9*   Coagulation Profile: Recent Labs  Lab 08/18/19 2215  INR 1.3*   Cardiac Enzymes: No results for input(s): CKTOTAL, CKMB, CKMBINDEX, TROPONINI in the last 168 hours. BNP (last 3 results) No results for input(s): PROBNP in the last 8760 hours. HbA1C: Recent Labs    08/18/19 2215  HGBA1C 5.6   CBG: Recent Labs  Lab 08/19/19 1234 08/19/19 1646 08/19/19 2049  08/20/19 0742 08/20/19 1206  GLUCAP 159* 115* 174* 102* 147*   Lipid Profile: Recent Labs    08/19/19 0641  CHOL 110  HDL 37*  LDLCALC 62  TRIG 55  CHOLHDL 3.0   Thyroid Function Tests: No results for input(s): TSH, T4TOTAL, FREET4, T3FREE, THYROIDAB in the last 72 hours. Anemia Panel: No results for input(s): VITAMINB12, FOLATE, FERRITIN, TIBC, IRON, RETICCTPCT in the last 72 hours. Sepsis Labs: No results for input(s): PROCALCITON, LATICACIDVEN in the last 168 hours.  Recent Results (from the past 240 hour(s))  SARS Coronavirus 2 by RT PCR (hospital order, performed in [facility] hospital lab) Nasopharyngeal Nasopharyngeal Swab     Status: None  Collection Time: 08/12/19  2:39 PM   Specimen: Nasopharyngeal Swab  Result Value Ref Range Status   SARS Coronavirus 2 NEGATIVE NEGATIVE Final    Comment: (NOTE) SARS-CoV-2 target nucleic acids are NOT DETECTED.  The SARS-CoV-2 RNA is generally detectable in upper and lower respiratory specimens during the acute phase of infection. The lowest concentration of SARS-CoV-2 viral copies this assay can detect is 250 copies / mL. A negative result does not preclude SARS-CoV-2 infection and should not be used as the sole basis for treatment or other patient management decisions.  A negative result may occur with improper specimen collection / handling, submission of specimen other than nasopharyngeal swab, presence of viral mutation(s) within the areas targeted by this assay, and inadequate number of viral copies (<250 copies / mL). A negative result must be combined with clinical observations, patient history, and epidemiological information.  Fact Sheet for Patients:   StrictlyIdeas.no  Fact Sheet for Healthcare Providers: BankingDealers.co.za  This test is not yet approved or  cleared by the [place] FDA and has been authorized for detection and/or diagnosis of SARS-CoV-2  by FDA under an Emergency Use Authorization (EUA).  This EUA will remain in effect (meaning this test can be used) for the duration of the COVID-19 declaration under Section 564(b)(1) of the Act, 21 U.S.C. section 360bbb-3(b)(1), unless the authorization is terminated or revoked sooner.  Performed at [facility], [address] [place], [place] [address]   Urine Culture     Status: Abnormal   Collection Time: 08/13/19  6:30 PM   Specimen: Urine, Random  Result Value Ref Range Status   Specimen Description   Final    URINE, RANDOM Performed at [facility], [address], [place], [place] [address]    Special Requests   Final    NONE Performed at [facility], [address] [place], [place] [address]    Culture (A)  Final    70,000 COLONIES/mL MULTIPLE SPECIES PRESENT, SUGGEST RECOLLECTION   Report Status 08/15/2019 FINAL  Final  SARS Coronavirus 2 by RT PCR (hospital order, performed in [facility] hospital lab) Nasopharyngeal Nasopharyngeal Swab     Status: None   Collection Time: 08/18/19  7:08 PM   Specimen: Nasopharyngeal Swab  Result Value Ref Range Status   SARS Coronavirus 2 NEGATIVE NEGATIVE Final    Comment: (NOTE) SARS-CoV-2 target nucleic acids are NOT DETECTED.  The SARS-CoV-2 RNA is generally detectable in upper and lower respiratory specimens during the acute phase of infection. The lowest concentration of SARS-CoV-2 viral copies this assay can detect is 250 copies / mL. A negative result does not preclude SARS-CoV-2 infection and should not be used as the sole basis for treatment or other patient management decisions.  A negative result may occur with improper specimen collection / handling, submission of specimen other than nasopharyngeal swab, presence of viral mutation(s) within the areas targeted by this assay, and inadequate number of viral copies (<250 copies / mL). A negative result must be combined with  clinical observations, patient history, and epidemiological information.  Fact Sheet for Patients:   StrictlyIdeas.no  Fact Sheet for Healthcare Providers: BankingDealers.co.za  This test is not yet approved or  cleared by the [place] FDA and has been authorized for detection and/or diagnosis of SARS-CoV-2 by FDA under an Emergency Use Authorization (EUA).  This EUA will remain in effect (meaning this test can be used) for the duration of the COVID-19 declaration under Section 564(b)(1)  of the Act, 21 U.S.C. section 360bbb-3(b)(1), unless the authorization is terminated or revoked sooner.  Performed at [facility], [address] [place],  [address]       Radiology Studies: DG Ribs Unilateral W/Chest Right  Result Date: 08/18/2019 CLINICAL DATA:  86 year old female with fall. EXAM: RIGHT RIBS AND CHEST - 3+ VIEW COMPARISON:  Chest radiograph dated 08/06/2019. FINDINGS: No focal consolidation, pleural effusion, pneumothorax. Stable cardiomegaly. There is calcification of the mitral annulus. Atherosclerotic calcification of the aortic arch. Osteopenia with degenerative changes of the spine. No acute osseous pathology. No displaced rib fractures. IMPRESSION: 1. No acute cardiopulmonary process. 2. No displaced rib fracture or pneumothorax. Electronically Signed   By: Anner Crete M.D.   On: 08/18/2019 17:50   CT Head Wo Contrast  Result Date: 08/18/2019 CLINICAL DATA:  Weakness and subsequent fall. EXAM: CT HEAD WITHOUT CONTRAST TECHNIQUE: Contiguous axial images were obtained from the base of the skull through the vertex without intravenous contrast. COMPARISON:  August 12, 2019 FINDINGS: Brain: There is mild cerebral atrophy with widening of the extra-axial spaces and ventricular dilatation. There are areas of decreased attenuation within the white matter tracts of the supratentorial brain, consistent with  microvascular disease changes. Vascular: No hyperdense vessel or unexpected calcification. Skull: Normal. Negative for fracture or focal lesion. Sinuses/Orbits: No acute finding. Other: None. IMPRESSION: 1. Generalized cerebral atrophy. 2. No acute intracranial abnormality. Electronically Signed   By: Virgina Norfolk M.D.   On: 08/18/2019 17:34   CT Cervical Spine Wo Contrast  Result Date: 08/18/2019 CLINICAL DATA:  Status post fall. EXAM: CT CERVICAL SPINE WITHOUT CONTRAST TECHNIQUE: Multidetector CT imaging of the cervical spine was performed without intravenous contrast. Multiplanar CT image reconstructions were also generated. COMPARISON:  None. FINDINGS: Alignment: Normal. Skull base and vertebrae: No acute fracture. No primary bone lesion or focal pathologic process. Soft tissues and spinal canal: No prevertebral fluid or swelling. No visible canal hematoma. Disc levels: Marked severity endplate sclerosis is seen at the levels of C5-C6 and C6-C7. Moderate to marked severity intervertebral disc space narrowing is also seen at these levels. Mild multilevel endplate sclerosis and mild intervertebral disc space narrowing is seen throughout the remainder of the cervical spine. There is moderate to marked severity bilateral multilevel facet joint hypertrophy. Upper chest: Moderate severity right apical scarring and/or atelectasis is seen. Pleural fluid is also seen along the right apex. Other: None. IMPRESSION: 1. No acute fracture within the cervical spine. 2. Marked severity degenerative changes at the levels of C5-C6 and C6-C7. 3. Moderate severity right apical scarring and/or atelectasis. 4. Right apical pleural fluid. Electronically Signed   By: Virgina Norfolk M.D.   On: 08/18/2019 17:40     Scheduled Meds: . aspirin EC  81 mg Oral Daily  . calamine   Topical QID  . clopidogrel  75 mg Oral Daily  . fluticasone furoate-vilanterol  1 puff Inhalation Daily  . insulin aspart  0-15 Units  Subcutaneous TID WC  . insulin aspart  0-5 Units Subcutaneous QHS  . levothyroxine  88 mcg Oral Q0600  . oxybutynin  5 mg Oral Daily  . pantoprazole  20 mg Oral Daily  . simvastatin  40 mg Oral QHS  . valACYclovir  1,000 mg Oral BID   Continuous Infusions:   LOS: 1 day     Enzo Bi, MD [facility] If 7PM-7AM, please contact night-coverage 08/20/2019, 3:39 PM

## 2019-08-20 NOTE — Progress Notes (Signed)
[place] for heparin Indication: chest pain/ACS  Allergies  Allergen Reactions  . Atenolol Other (See Comments)    Other reaction(s): Other (See Comments) Decreased heart rate Decreased heart rate  . Codeine Shortness Of Breath    Rash, difficulty breathing, nausea.  . Losartan Other (See Comments)    Hyperkalemia  . Morphine And Related Shortness Of Breath    Rash, difficulty breathing, nausea.   . Nsaids Other (See Comments)    Other reaction(s): Unknown  . Rofecoxib     Other reaction(s): Unknown  . Sucralfate Other (See Comments)    Throat tightness  . Sulfa Antibiotics     Other reaction(s): Unknown    Patient Measurements: Height: 5\' 2"  (157.5 cm) Weight: 77.1 kg (170 lb) IBW/kg (Calculated) : 50.1 Heparin Dosing Weight: 67 kg  Vital Signs: Temp: 98.1 F (36.7 C) (06/25 2338) Temp Source: Oral (06/25 2338) BP: 143/40 (06/25 2338) Pulse Rate: 57 (06/25 2338)  Labs: Recent Labs    08/18/19 1716 08/18/19 1908 08/18/19 2215 08/19/19 0641 08/19/19 1453 08/19/19 2301  HGB 9.7*  --   --  8.4*  --   --   HCT 28.9*  --   --  25.0*  --   --   PLT 229  --   --  210  --   --   APTT  --   --  >160*  --   --   --   LABPROT  --   --  15.4*  --   --   --   INR  --   --  1.3*  --   --   --   HEPARINUNFRC  --   --   --  0.95* 0.50 0.37  CREATININE 3.14*  --   --  2.82*  --   --   TROPONINIHS 733* 724*  --   --   --   --     Estimated Creatinine Clearance: 12.7 mL/min (A) (by C-G formula based on SCr of 2.82 mg/dL (H)).    Assessment: 86 year old female presented with weakness and a fall PTA. Pharmacy consulted for heparin drip.  Heparin infusion started @ 800 units/hr  6/25 HL: 0.95. Will reduce infusion rate to 650 units/hr.   Goal of Therapy:  Heparin level 0.3-0.7 units/ml Monitor platelets by anticoagulation protocol: Yes   Plan:  6/25 2301 HL 0.37. Therapeutic x 2. Continue heparin drip at current rate (650  units/hr).  Recheck HL in 8 hours and CBC daily while on heparin drip.  Pharmacy will continue to follow.   Ena Dawley, PharmD Clinical Pharmacist 08/20/2019 12:12 AM

## 2019-08-20 NOTE — Progress Notes (Signed)
Progress Note  Patient Name: Amy [name] Date of Encounter: 08/20/2019  [facility] Cardiologist: Ida Rogue, MD    Subjective   86 year old female with history of PCI in the past.  She has chronic diastolic congestive heart failure, hypertension, type 2 diabetes mellitus, chronic kidney disease stage IV.  She has a history of obesity, hypothyroidism, chronic anemia.   She was recently admitted following an episode of syncope.  She was found to have a potassium level of 6.  She also was recently diagnosed with shingles and was on several medications-gabapentin and oxycodone which were thought to have contributed to her episode of syncope.  She had troponin elevations that were relatively flat and thought not to be consistent  with an acute coronary syndrome. Inpatient Medications    Scheduled Meds: . aspirin EC  81 mg Oral Daily  . calamine   Topical QID  . clopidogrel  75 mg Oral Daily  . fluticasone furoate-vilanterol  1 puff Inhalation Daily  . insulin aspart  0-15 Units Subcutaneous TID WC  . insulin aspart  0-5 Units Subcutaneous QHS  . levothyroxine  88 mcg Oral Q0600  . oxybutynin  5 mg Oral Daily  . pantoprazole  20 mg Oral Daily  . simvastatin  40 mg Oral QHS  . valACYclovir  1,000 mg Oral BID   Continuous Infusions:  PRN Meds: acetaminophen, ipratropium-albuterol, nitroGLYCERIN, ondansetron (ZOFRAN) IV   Vital Signs    Vitals:   08/19/19 2338 08/20/19 0138 08/20/19 0535 08/20/19 0744  BP: (!) 143/40 (!) 140/40 (!) 163/36 (!) 164/38  Pulse: (!) 57  (!) 59 64  Resp:  20 20 18   Temp: 98.1 F (36.7 C) 97.8 F (36.6 C) 98 F (36.7 C) 97.7 F (36.5 C)  TempSrc: Oral Oral Oral Oral  SpO2: 99% 99% 100% 100%  Weight:      Height:        Intake/Output Summary (Last 24 hours) at 08/20/2019 1142 Last data filed at 08/20/2019 0539 Gross per 24 hour  Intake 399.74 ml  Output 100 ml  Net 299.74 ml   Last 3 Weights 08/18/2019 08/14/2019 08/12/2019   Weight (lbs) 170 lb 167 lb 4.8 oz 165 lb 5.5 oz  Weight (kg) 77.111 kg 75.887 kg 75 kg      Telemetry    NSR ,  1st degree AV block - Personally Reviewed  ECG     - Personally Reviewed  Physical Exam    GEN:  elderly , frail female.  Neck: No JVD Cardiac: RRR, no murmurs, rubs, or gallops.  Respiratory: Clear to auscultation bilaterally. GI: Soft, nontender, non-distended  MS: No edema; No deformity. Neuro:  Nonfocal  Psych: Normal affect   Labs    High Sensitivity Troponin:   Recent Labs  Lab 08/07/19 0611 08/07/19 0940 08/12/19 1300 08/18/19 1716 08/18/19 1908  TROPONINIHS 29* 26* 17 733* 724*      Chemistry Recent Labs  Lab 08/18/19 1716 08/19/19 0641 08/20/19 0449  NA 131* 137 137  K 4.6 4.3 4.9  CL 104 112* 112*  CO2 15* 19* 19*  GLUCOSE 284* 127* 111*  BUN 98* 100* 93*  CREATININE 3.14* 2.82* 2.31*  CALCIUM 8.9 8.3* 8.3*  GFRNONAA 12* 14* 18*  GFRAA 14* 16* 21*  ANIONGAP 12 6 6      Hematology Recent Labs  Lab 08/18/19 1716 08/19/19 0641 08/20/19 0735  WBC 11.4* 11.7* 9.2  RBC 3.06* 2.67* 2.86*  HGB 9.7* 8.4* 9.0*  HCT  28.9* 25.0* 27.1*  MCV 94.4 93.6 94.8  MCH 31.7 31.5 31.5  MCHC 33.6 33.6 33.2  RDW 14.2 14.1 14.3  PLT 229 210 213    BNPNo results for input(s): BNP, PROBNP in the last 168 hours.   DDimer No results for input(s): DDIMER in the last 168 hours.   Radiology    DG Ribs Unilateral W/Chest Right  Result Date: 08/18/2019 CLINICAL DATA:  86 year old female with fall. EXAM: RIGHT RIBS AND CHEST - 3+ VIEW COMPARISON:  Chest radiograph dated 08/06/2019. FINDINGS: No focal consolidation, pleural effusion, pneumothorax. Stable cardiomegaly. There is calcification of the mitral annulus. Atherosclerotic calcification of the aortic arch. Osteopenia with degenerative changes of the spine. No acute osseous pathology. No displaced rib fractures. IMPRESSION: 1. No acute cardiopulmonary process. 2. No displaced rib fracture or  pneumothorax. Electronically Signed   By: Anner Crete M.D.   On: 08/18/2019 17:50   CT Head Wo Contrast  Result Date: 08/18/2019 CLINICAL DATA:  Weakness and subsequent fall. EXAM: CT HEAD WITHOUT CONTRAST TECHNIQUE: Contiguous axial images were obtained from the base of the skull through the vertex without intravenous contrast. COMPARISON:  August 12, 2019 FINDINGS: Brain: There is mild cerebral atrophy with widening of the extra-axial spaces and ventricular dilatation. There are areas of decreased attenuation within the white matter tracts of the supratentorial brain, consistent with microvascular disease changes. Vascular: No hyperdense vessel or unexpected calcification. Skull: Normal. Negative for fracture or focal lesion. Sinuses/Orbits: No acute finding. Other: None. IMPRESSION: 1. Generalized cerebral atrophy. 2. No acute intracranial abnormality. Electronically Signed   By: Virgina Norfolk M.D.   On: 08/18/2019 17:34   CT Cervical Spine Wo Contrast  Result Date: 08/18/2019 CLINICAL DATA:  Status post fall. EXAM: CT CERVICAL SPINE WITHOUT CONTRAST TECHNIQUE: Multidetector CT imaging of the cervical spine was performed without intravenous contrast. Multiplanar CT image reconstructions were also generated. COMPARISON:  None. FINDINGS: Alignment: Normal. Skull base and vertebrae: No acute fracture. No primary bone lesion or focal pathologic process. Soft tissues and spinal canal: No prevertebral fluid or swelling. No visible canal hematoma. Disc levels: Marked severity endplate sclerosis is seen at the levels of C5-C6 and C6-C7. Moderate to marked severity intervertebral disc space narrowing is also seen at these levels. Mild multilevel endplate sclerosis and mild intervertebral disc space narrowing is seen throughout the remainder of the cervical spine. There is moderate to marked severity bilateral multilevel facet joint hypertrophy. Upper chest: Moderate severity right apical scarring and/or  atelectasis is seen. Pleural fluid is also seen along the right apex. Other: None. IMPRESSION: 1. No acute fracture within the cervical spine. 2. Marked severity degenerative changes at the levels of C5-C6 and C6-C7. 3. Moderate severity right apical scarring and/or atelectasis. 4. Right apical pleural fluid. Electronically Signed   By: Virgina Norfolk M.D.   On: 08/18/2019 17:40    Cardiac Studies      Patient Profile     86 y.o. female with episode of syncope - possibly related to medications for her shingls   Assessment & Plan     1.  Syncope:   No significant pauses on tele .     Recent eco shows normal LV function with EF 60-65%.   Mod - severe LVH ,  Grade 1 diastolic dysfunction  Severe LAE .  Mild - mod AS with mean gradient of 17 mmHg.  She appears stable from a cardiac standpoint We will arrange for an OP monitor   2.  Elevated troponins :   Likely demand ischemia .   No plans for ischemic work up at this point .    [facility] will sign off.   Medication Recommendations:  Cont current meds  Other recommendations (labs, testing, etc):   OP moniotr  Follow up as an outpatient:   With Dr. Saunders Revel.    For questions or updates, please contact [place] Please consult [email or web address] for contact info under        Signed, Mertie Moores, MD  08/20/2019, 11:42 AM

## 2019-08-20 NOTE — Progress Notes (Addendum)
Pt is complaining of 7/10 where pt scabbed shingles at (right). Notify NP OUma. Will contineu to monitor.  Update [phone]: Talked to [facility] and ordered to give PRN 1000 mf tylenol at this time. Will continue to monitor.

## 2019-08-20 NOTE — Progress Notes (Signed)
PT Cancellation Note  Patient Details Name: Amy [name] MRN: [id number] DOB: Jan 17, 1928   Cancelled Treatment:    Reason Eval/Treat Not Completed: Patient not medically ready Pt was discharged from facility earlier this week.  Now back with episode of weakness, fall and hitting head.  Chart reviewed, pt appears to be cleared by cardiology relating to possible NSTEMI/elevated troponin but having significant swings in BP, will continue to monitor and will attempt to see pt tomorrow as appropriate.  Kreg Shropshire, DPT 08/20/2019, 5:38 PM

## 2019-08-20 NOTE — Progress Notes (Deleted)
[place]  [phone]) [phone] Fax:(336) [phone]  ID: Amy [name] OB: [birth year]  MR#: [id number]  CWC#:[id number]  Patient Care Team: [name], MD as PCP - General (Internal Medicine) Rockey Situ Kathlene November, MD as PCP - Cardiology (Cardiology) Minna Merritts, MD as Consulting Physician (Cardiology)  CHIEF COMPLAINT: Anemia secondary to chronic renal insufficiency.  INTERVAL HISTORY: Patient returns to clinic today for repeat laboratory, further evaluation and consideration of treatment.  She was in her usual state of health until yesterday when she developed cough and congestion.  She denies any fevers.  She has increased shortness of breath and wheezing.  Her weakness and fatigue are slightly worse.  She has no neurologic complaints.  She denies any chest pain or hemoptysis. She denies any nausea, vomiting, constipation, or diarrhea.  She has no urinary complaints.  Patient offers no further specific complaints today.  REVIEW OF SYSTEMS:   Review of Systems  Constitutional: Positive for malaise/fatigue. Negative for fever and weight loss.  HENT: Positive for congestion. Negative for sore throat.   Respiratory: Positive for cough and shortness of breath. Negative for hemoptysis and stridor.   Cardiovascular: Negative.  Negative for chest pain and leg swelling.  Gastrointestinal: Negative for abdominal pain, blood in stool and melena.  Genitourinary: Negative.  Negative for hematuria.  Musculoskeletal: Negative.  Negative for back pain.  Skin: Negative.  Negative for rash.  Neurological: Positive for weakness. Negative for sensory change, focal weakness and headaches.  Psychiatric/Behavioral: Negative.  The patient is not nervous/anxious.     As per HPI. Otherwise, a complete review of systems is negative.  PAST MEDICAL HISTORY: Past Medical History:  Diagnosis Date  . Bladder incontinence   . CAD (coronary artery disease)    a. 05/2009 Cath:  LAD 90p (Xience 2.75 x 12 mm DES), 25m, D1 40, LCx 40p/m, RCA 30/40/30p/m, RCA 30/25d;  b.  12/2011 Lexiscan MV: no ischemia, breast attenuation artifact, normal EF-->Low risk; c. 10/2016 MV: fixed apical defect, most likely apical thinning and attenuation, No ischemia, EF 60%.  . Cancer ([place])    ovarian  . Carotid arterial disease w/ R Carotid Bruit (HCC)    a. 08/2016 Carotid U/S: 40-59% bilat ICA stenosis - f/u 1 yr.  . Chronic diastolic (congestive) heart failure ([place])    a. Echo 09/2015: EF 55-60% w/ Grade 1 DD, sev Ca2+ MV annulus, mildly dil LA; b. 09/2016 Echo: EF 65-70%, Gr2 DD, mildly dil LA/RA, nl RV fxn.  . Chronic Dyspnea on exertion   . CKD (chronic kidney disease) stage 3, GFR 30-59 ml/min   . Degenerative arthritis of knee    bilateral knees  . Diabetes mellitus    Type II  . GIB (gastrointestinal bleeding)    a. 08/2016 Admission w/ presyncope/anemia/melena-->req Transfusion-->endo ok, colonoscopy w/ polyps but no source of bleeding (most likely diverticular).  . Hiatal hernia   . Hypertension   . Iron deficiency   . Menopausal symptoms   . Morbid obesity ([place])   . Renal insufficiency   . Thyroid disease    hypothyroidism    PAST SURGICAL HISTORY: Past Surgical History:  Procedure Laterality Date  . COLONOSCOPY  2015  . COLONOSCOPY WITH PROPOFOL N/A 09/25/2016   Procedure: COLONOSCOPY WITH PROPOFOL;  Surgeon: [name], MD;  Location[facility] ENDOSCOPY;  Service: Gastroenterology;  Laterality: N/A;  . COLONOSCOPY WITH PROPOFOL N/A 09/26/2016   Procedure: COLONOSCOPY WITH PROPOFOL;  Surgeon: [name], MD;  Location: [facility] ENDOSCOPY;  Service:  Gastroenterology;  Laterality: N/A;  . ESOPHAGOGASTRODUODENOSCOPY (EGD) WITH PROPOFOL N/A 09/25/2016   Procedure: ESOPHAGOGASTRODUODENOSCOPY (EGD) WITH PROPOFOL;  Surgeon: [name], MD;  Location: [facility] ENDOSCOPY;  Service: Gastroenterology;  Laterality: N/A;  . RENAL ANGIOGRAPHY N/A 02/07/2019   Procedure: RENAL ANGIOGRAPHY;   Surgeon: [name], MD;  Location: [place] CV LAB;  Service: Cardiovascular;  Laterality: N/A;  . REPLACEMENT TOTAL KNEE BILATERAL    . TOTAL VAGINAL HYSTERECTOMY     ovarian mass, not cancerous  . UPPER GI ENDOSCOPY  2015    FAMILY HISTORY Family History  Problem Relation Age of Onset  . Breast cancer Mother 61       ADVANCED DIRECTIVES:    HEALTH MAINTENANCE: Social History   Tobacco Use  . Smoking status: Never Smoker  . Smokeless tobacco: Never Used  Vaping Use  . Vaping Use: Never used  Substance Use Topics  . Alcohol use: No  . Drug use: No     Colonoscopy:  PAP:  Bone density:  Lipid panel:  Allergies  Allergen Reactions  . Atenolol Other (See Comments)    Other reaction(s): Other (See Comments) Decreased heart rate Decreased heart rate  . Codeine Shortness Of Breath    Rash, difficulty breathing, nausea.  . Losartan Other (See Comments)    Hyperkalemia  . Morphine And Related Shortness Of Breath    Rash, difficulty breathing, nausea.   . Nsaids Other (See Comments)    Other reaction(s): Unknown  . Rofecoxib     Other reaction(s): Unknown  . Sucralfate Other (See Comments)    Throat tightness  . Sulfa Antibiotics     Other reaction(s): Unknown    No current facility-administered medications for this visit.   No current outpatient medications on file.   Facility-Administered Medications Ordered in Other Visits  Medication Dose Route Frequency Provider Last Rate Last Admin  . 0.9 %  sodium chloride infusion   Intravenous Continuous Lloyd Huger, MD      . acetaminophen (TYLENOL) tablet 1,000 mg  1,000 mg Oral TID PRN Enzo Bi, MD   1,000 mg at 08/20/19 0653  . aspirin EC tablet 81 mg  81 mg Oral Daily Athena Masse, MD   81 mg at 08/20/19 3419  . calamine lotion   Topical QID Lang Snow, NP   Given at 08/20/19 [phone]  . clopidogrel (PLAVIX) tablet 75 mg  75 mg Oral Daily Enzo Bi, MD   75 mg at 08/20/19 2409  .  epoetin alfa-epbx (RETACRIT) injection 40,000 Units  40,000 Units Subcutaneous Once Lloyd Huger, MD      . fluticasone furoate-vilanterol (BREO ELLIPTA) 200-25 MCG/INH 1 puff  1 puff Inhalation Daily Enzo Bi, MD   1 puff at 08/20/19 [phone]  . insulin aspart (novoLOG) injection 0-15 Units  0-15 Units Subcutaneous TID WC Athena Masse, MD   3 Units at 08/19/19 1253  . insulin aspart (novoLOG) injection 0-5 Units  0-5 Units Subcutaneous QHS Athena Masse, MD   2 Units at 08/18/19 2238  . ipratropium-albuterol (DUONEB) 0.5-2.5 (3) MG/3ML nebulizer solution 3 mL  3 mL Nebulization Q4H PRN Athena Masse, MD      . levothyroxine (SYNTHROID) tablet 88 mcg  88 mcg Oral Q0600 Enzo Bi, MD   88 mcg at 08/20/19 0617  . metoprolol tartrate (LOPRESSOR) tablet 12.5 mg  12.5 mg Oral BID Athena Masse, MD   12.5 mg at 08/20/19 2992  . nitroGLYCERIN (NITROSTAT) SL  tablet 0.4 mg  0.4 mg Sublingual Q5 Min x 3 PRN Athena Masse, MD      . ondansetron [facility]) injection 4 mg  4 mg Intravenous Q6H PRN Athena Masse, MD      . oxybutynin (DITROPAN-XL) 24 hr tablet 5 mg  5 mg Oral Daily Enzo Bi, MD   5 mg at 08/20/19 [phone]  . pantoprazole (PROTONIX) EC tablet 20 mg  20 mg Oral Daily Enzo Bi, MD   20 mg at 08/20/19 [phone]  . simvastatin (ZOCOR) tablet 40 mg  40 mg Oral QHS Athena Masse, MD   40 mg at 08/19/19 2154  . valACYclovir (VALTREX) tablet 1,000 mg  1,000 mg Oral BID Enzo Bi, MD   1,000 mg at 08/20/19 4008    OBJECTIVE: There were no vitals filed for this visit.   There is no height or weight on file to calculate BMI.    ECOG FS:2 - Symptomatic, <50% confined to bed  General: Well-developed, well-nourished, no acute distress. Eyes: Pink conjunctiva, anicteric sclera. HEENT: Normocephalic, moist mucous membranes. Lungs: Scattered wheezing throughout. Heart: Regular rate and rhythm. Abdomen: Soft, nontender, no obvious distention. Musculoskeletal: No edema, cyanosis, or  clubbing. Neuro: Alert, answering all questions appropriately. Cranial nerves grossly intact. Skin: No rashes or petechiae noted. Psych: Normal affect.  LAB RESULTS:  Lab Results  Component Value Date   NA 137 08/20/2019   K 4.9 08/20/2019   CL 112 (H) 08/20/2019   CO2 19 (L) 08/20/2019   GLUCOSE 111 (H) 08/20/2019   BUN 93 (H) 08/20/2019   CREATININE 2.31 (H) 08/20/2019   CALCIUM 8.3 (L) 08/20/2019   PROT 5.2 (L) 08/12/2019   ALBUMIN 2.9 (L) 08/12/2019   AST 14 (L) 08/12/2019   ALT 11 08/12/2019   ALKPHOS 58 08/12/2019   BILITOT 0.7 08/12/2019   GFRNONAA 18 (L) 08/20/2019   GFRAA 21 (L) 08/20/2019    Lab Results  Component Value Date   WBC 9.2 08/20/2019   NEUTROABS 6.8 08/12/2019   HGB 9.0 (L) 08/20/2019   HCT 27.1 (L) 08/20/2019   MCV 94.8 08/20/2019   PLT 213 08/20/2019   Lab Results  Component Value Date   IRON 47 07/27/2019   TIBC 186 (L) 07/27/2019   IRONPCTSAT 25 07/27/2019    Lab Results  Component Value Date   FERRITIN 611 (H) 07/27/2019    STUDIES: DG Chest 2 View  Result Date: 08/06/2019 CLINICAL DATA:  Chest pain and shortness of breath EXAM: CHEST - 2 VIEW COMPARISON:  July 28, 2019 FINDINGS: There is no edema or airspace opacity. Heart is upper normal in size with pulmonary vascularity normal. No adenopathy. There is a moderate hiatal hernia, stable. There is aortic atherosclerosis. No pneumothorax. No bone lesions. IMPRESSION: No edema or airspace opacity. Stable cardiac silhouette. Hiatal hernia present. No evident adenopathy. Aortic Atherosclerosis (ICD10-I70.0). Electronically Signed   By: Lowella Grip III M.D.   On: 08/06/2019 10:06   DG Chest 2 View  Result Date: 07/28/2019 CLINICAL DATA:  Shortness of breath EXAM: CHEST - 2 VIEW COMPARISON:  01/29/2018 FINDINGS: The heart size and mediastinal contours are mildly enlarged, stable. Atherosclerotic calcification of the aortic knob. Moderate-sized hiatal hernia is again noted. No focal  airspace consolidation, pleural effusion, or pneumothorax. The visualized skeletal structures are within normal limits. IMPRESSION: No active cardiopulmonary disease. Electronically Signed   By: Davina Poke D.O.   On: 07/28/2019 13:11   DG Ribs Unilateral W/Chest Right  Result  Date: 08/18/2019 CLINICAL DATA:  86 year old female with fall. EXAM: RIGHT RIBS AND CHEST - 3+ VIEW COMPARISON:  Chest radiograph dated 08/06/2019. FINDINGS: No focal consolidation, pleural effusion, pneumothorax. Stable cardiomegaly. There is calcification of the mitral annulus. Atherosclerotic calcification of the aortic arch. Osteopenia with degenerative changes of the spine. No acute osseous pathology. No displaced rib fractures. IMPRESSION: 1. No acute cardiopulmonary process. 2. No displaced rib fracture or pneumothorax. Electronically Signed   By: Anner Crete M.D.   On: 08/18/2019 17:50   CT Head Wo Contrast  Result Date: 08/18/2019 CLINICAL DATA:  Weakness and subsequent fall. EXAM: CT HEAD WITHOUT CONTRAST TECHNIQUE: Contiguous axial images were obtained from the base of the skull through the vertex without intravenous contrast. COMPARISON:  August 12, 2019 FINDINGS: Brain: There is mild cerebral atrophy with widening of the extra-axial spaces and ventricular dilatation. There are areas of decreased attenuation within the white matter tracts of the supratentorial brain, consistent with microvascular disease changes. Vascular: No hyperdense vessel or unexpected calcification. Skull: Normal. Negative for fracture or focal lesion. Sinuses/Orbits: No acute finding. Other: None. IMPRESSION: 1. Generalized cerebral atrophy. 2. No acute intracranial abnormality. Electronically Signed   By: Virgina Norfolk M.D.   On: 08/18/2019 17:34   CT HEAD WO CONTRAST  Result Date: 08/12/2019 CLINICAL DATA:  Syncope. EXAM: CT HEAD WITHOUT CONTRAST TECHNIQUE: Contiguous axial images were obtained from the base of the skull through the  vertex without intravenous contrast. COMPARISON:  September 21, 2016 FINDINGS: Brain: There is mild cerebral atrophy with widening of the extra-axial spaces and ventricular dilatation. There are areas of decreased attenuation within the white matter tracts of the supratentorial brain, consistent with microvascular disease changes. Vascular: No hyperdense vessel or unexpected calcification. Skull: Normal. Negative for fracture or focal lesion. Sinuses/Orbits: No acute finding. Other: None. IMPRESSION: 1. Generalized cerebral atrophy. 2. No acute intracranial abnormality. Electronically Signed   By: Virgina Norfolk M.D.   On: 08/12/2019 17:05   CT Chest Wo Contrast  Result Date: 08/06/2019 CLINICAL DATA:  Nonspecific chest pain EXAM: CT CHEST WITHOUT CONTRAST TECHNIQUE: Multidetector CT imaging of the chest was performed following the standard protocol without IV contrast. COMPARISON:  01/13/2017 FINDINGS: Cardiovascular: Normal heart size. No pericardial effusion. Extensive atherosclerotic calcification of the aorta, great vessels, and coronaries. Mediastinum/Nodes: Moderate sliding hiatal hernia.  No adenopathy. Lungs/Pleura: There is no edema, consolidation, effusion, or pneumothorax. Scattered pulmonary nodules measuring up to 5 mm in the left lower lobe 7 mm in the right upper lobe. These are stable from prior and considered benign. Mild subpleural atelectasis or scarring in the apical lungs. Upper Abdomen: Small calcification and left upper pole cystic density. Musculoskeletal: Extensive spondylosis with multi-level thoracic bridging. No evidence of fracture or erosion. IMPRESSION: 1. No acute finding. 2. Extensive atherosclerosis. 3. Scattered pulmonary nodules that are stable and benign. 4. Moderate hiatal hernia. Aortic Atherosclerosis (ICD10-I70.0). Electronically Signed   By: Monte Fantasia M.D.   On: 08/06/2019 13:41   CT Cervical Spine Wo Contrast  Result Date: 08/18/2019 CLINICAL DATA:  Status post  fall. EXAM: CT CERVICAL SPINE WITHOUT CONTRAST TECHNIQUE: Multidetector CT imaging of the cervical spine was performed without intravenous contrast. Multiplanar CT image reconstructions were also generated. COMPARISON:  None. FINDINGS: Alignment: Normal. Skull base and vertebrae: No acute fracture. No primary bone lesion or focal pathologic process. Soft tissues and spinal canal: No prevertebral fluid or swelling. No visible canal hematoma. Disc levels: Marked severity endplate sclerosis is seen at the levels  of C5-C6 and C6-C7. Moderate to marked severity intervertebral disc space narrowing is also seen at these levels. Mild multilevel endplate sclerosis and mild intervertebral disc space narrowing is seen throughout the remainder of the cervical spine. There is moderate to marked severity bilateral multilevel facet joint hypertrophy. Upper chest: Moderate severity right apical scarring and/or atelectasis is seen. Pleural fluid is also seen along the right apex. Other: None. IMPRESSION: 1. No acute fracture within the cervical spine. 2. Marked severity degenerative changes at the levels of C5-C6 and C6-C7. 3. Moderate severity right apical scarring and/or atelectasis. 4. Right apical pleural fluid. Electronically Signed   By: Virgina Norfolk M.D.   On: 08/18/2019 17:40   US Venous Img Lower Bilateral (DVT)  Result Date: 08/06/2019 CLINICAL DATA:  Lower extremity swelling EXAM: BILATERAL LOWER EXTREMITY VENOUS DOPPLER ULTRASOUND TECHNIQUE: Gray-scale sonography with compression, as well as color and duplex ultrasound, were performed to evaluate the deep venous system(s) from the level of the common femoral vein through the popliteal and proximal calf veins. COMPARISON:  02/10/2019 right lower extremity venous Doppler scan FINDINGS: VENOUS Normal compressibility of the common femoral, superficial femoral, and popliteal veins, as well as the visualized calf veins. Visualized portions of profunda femoral vein and  great saphenous vein unremarkable. No filling defects to suggest DVT on grayscale or color Doppler imaging. Doppler waveforms show normal direction of venous flow, normal respiratory plasticity and response to augmentation. Limited views of the contralateral common femoral vein are unremarkable. OTHER None. Limitations: none IMPRESSION: No evidence of deep venous thrombosis in either lower extremity. Electronically Signed   By: Ilona Sorrel M.D.   On: 08/06/2019 16:23   ECHOCARDIOGRAM COMPLETE BUBBLE STUDY  Result Date: 08/14/2019    ECHOCARDIOGRAM REPORT   Patient Name:   Amy [name] Date of Exam: 08/14/2019 Medical Rec #:  [id number]          Height:       62.0 in Accession #:    [id number]         Weight:       167.3 lb Date of Birth:  [birth year]          BSA:          1.772 m Patient Age:    86 years           BP:           193/48 mmHg Patient Gender: F                  HR:           71 bpm. Exam Location:  [facility] Procedure: 2D Echo and Saline Contrast Bubble Study Indications:     Syncope  History:         Patient has prior history of Echocardiogram examinations. CAD;                  Risk Factors:Hypertension, Dyslipidemia and Diabetes.  Sonographer:     L Thornton-Maynard Referring Phys:  [id number] NIOEVOJJ K RAI Diagnosing Phys: Ida Rogue MD IMPRESSIONS  1. Left ventricular ejection fraction, by estimation, is 60 to 65%. The left ventricle has normal function. The left ventricle has no regional wall motion abnormalities. There is moderate to severe left ventricular hypertrophy. Left ventricular diastolic parameters are consistent with Grade I diastolic dysfunction (impaired relaxation).  2. Right ventricular systolic function is normal. The right ventricular size is normal. Tricuspid regurgitation signal is inadequate for assessing PA pressure.  3.  Left atrial size was severely dilated  4. The aortic valve is normal in structure. Aortic valve regurgitation is not visualized. Mild aortic valve  stenosis. Aortic valve mean gradient measures 17.0 mmHg. Aortic valve Vmax measures 2.74 m/s. FINDINGS  Left Ventricle: Left ventricular ejection fraction, by estimation, is 60 to 65%. The left ventricle has normal function. The left ventricle has no regional wall motion abnormalities. The left ventricular internal cavity size was normal in size. There is  moderate left ventricular hypertrophy. Left ventricular diastolic parameters are consistent with Grade I diastolic dysfunction (impaired relaxation). Right Ventricle: The right ventricular size is normal. No increase in right ventricular wall thickness. Right ventricular systolic function is normal. Tricuspid regurgitation signal is inadequate for assessing PA pressure. Left Atrium: Left atrial size was severely dilated. Right Atrium: Right atrial size was normal in size. Pericardium: There is no evidence of pericardial effusion. Mitral Valve: The mitral valve is normal in structure. Normal mobility of the mitral valve leaflets. Severe mitral annular calcification. Mild mitral valve regurgitation. No evidence of mitral valve stenosis. MV peak gradient, 14.4 mmHg. The mean mitral valve gradient is 7.0 mmHg. Tricuspid Valve: The tricuspid valve is normal in structure. Tricuspid valve regurgitation is not demonstrated. No evidence of tricuspid stenosis. Aortic Valve: The aortic valve is normal in structure. Aortic valve regurgitation is not visualized. Mild aortic stenosis is present. Aortic valve mean gradient measures 17.0 mmHg. Aortic valve peak gradient measures 30.0 mmHg. Aortic valve area, by VTI measures 1.13 cm. Pulmonic Valve: The pulmonic valve was normal in structure. Pulmonic valve regurgitation is not visualized. No evidence of pulmonic stenosis. Aorta: The aortic root is normal in size and structure. Venous: The inferior vena cava is normal in size with greater than 50% respiratory variability, suggesting right atrial pressure of 3 mmHg. IAS/Shunts: No  atrial level shunt detected by color flow Doppler. Agitated saline contrast was given intravenously to evaluate for intracardiac shunting.  LEFT VENTRICLE PLAX 2D LVIDd:         2.96 cm  Diastology LVIDs:         2.13 cm  LV e' lateral:   3.48 cm/s LV PW:         1.63 cm  LV E/e' lateral: 38.8 LV IVS:        1.96 cm  LV e' medial:    4.68 cm/s LVOT diam:     1.70 cm  LV E/e' medial:  28.8 LV SV:         61 LV SV Index:   34 LVOT Area:     2.27 cm  RIGHT VENTRICLE RV S prime:     14.00 cm/s TAPSE (M-mode): 3.7 cm LEFT ATRIUM              Index LA diam:        3.60 cm  2.03 cm/m LA Vol (A2C):   75.3 ml  42.49 ml/m LA Vol (A4C):   113.0 ml 63.77 ml/m LA Biplane Vol: 95.6 ml  53.95 ml/m  AORTIC VALVE                    PULMONIC VALVE AV Area (Vmax):    1.06 cm     PV Vmax:       1.58 m/s AV Area (Vmean):   1.15 cm     PV Vmean:      125.000 cm/s AV Area (VTI):     1.13 cm     PV VTI:  0.356 m AV Vmax:           274.00 cm/s  PV Peak grad:  10.0 mmHg AV Vmean:          190.000 cm/s PV Mean grad:  7.0 mmHg AV VTI:            0.538 m AV Peak Grad:      30.0 mmHg AV Mean Grad:      17.0 mmHg LVOT Vmax:         128.00 cm/s LVOT Vmean:        96.300 cm/s LVOT VTI:          0.268 m LVOT/AV VTI ratio: 0.50  AORTA Ao Root diam: 2.90 cm MITRAL VALVE MV Area (PHT): 1.76 cm     SHUNTS MV Peak grad:  14.4 mmHg    Systemic VTI:  0.27 m MV Mean grad:  7.0 mmHg     Systemic Diam: 1.70 cm MV Vmax:       1.90 m/s MV Vmean:      126.0 cm/s MV Decel Time: 430 msec MV E velocity: 135.00 cm/s MV A velocity: 183.00 cm/s MV E/A ratio:  0.74 Ida Rogue MD Electronically signed by Ida Rogue MD Signature Date/Time: 08/14/2019/2:22:07 PM    Final    DG HIP UNILAT WITH PELVIS 2-3 VIEWS RIGHT  Result Date: 08/12/2019 CLINICAL DATA:  Right groin pain EXAM: DG HIP (WITH OR WITHOUT PELVIS) 2-3V RIGHT COMPARISON:  None. FINDINGS: Degenerative changes of the right hip joint are noted. Pelvic ring is intact. No acute fracture or  dislocation is noted. IMPRESSION: Significant degenerative change of the right hip. Electronically Signed   By: Inez Catalina M.D.   On: 08/12/2019 16:49    ASSESSMENT: Anemia secondary to chronic renal insufficiency.  PLAN:    1.  Anemia secondary to chronic renal insufficiency: Patient's iron stores are now within normal limits.  Her hemoglobin remains significantly decreased at 8.2, although improved from previous.  She does not require additional IV iron today.  She last received treatment with Feraheme on June 22, 2019.  Given her persistent anemia and chronic renal insufficiency, will proceed with 40,000 units Retacrit today.  Return to clinic in 1 month with repeat laboratory work, further evaluation, and continuation of treatment if needed.  2. Pulmonary nodules: Repeat CT scan from January 13, 2017 revealed stable pulmonary nodules.  No further imaging is necessary.   3. Renal insufficiency: Creatinine significantly worse today.  Call was placed to nephrology to move her appointment up for further evaluation. 4.  Hypertension: Blood pressure mildly elevated today.  Proceed with treatment as above. 5.  History of GI bleed: Colonoscopy and EGD in August 2018.  Continue follow-up with GI as indicated. 6.  Shortness of breath/cough/congestion: No active cardiopulmonary disease on chest x-ray today.  Have recommended symptomatic treatment and follow-up with primary care if symptoms become worse.  Patient expressed understanding and was in agreement with this plan. She also understands that She can call clinic at any time with any questions, concerns, or complaints.    Lloyd Huger, MD   08/20/2019 9:24 AM

## 2019-08-20 NOTE — Plan of Care (Signed)
  Problem: Education: Goal: Knowledge of General Education information will improve Description: Including pain rating scale, medication(s)/side effects and non-pharmacologic comfort measures Outcome: Progressing   Problem: Health Behavior/Discharge Planning: Goal: Ability to manage health-related needs will improve Outcome: Progressing   Problem: Clinical Measurements: Goal: Cardiovascular complication will be avoided Outcome: Progressing   Problem: Pain Managment: Goal: General experience of comfort will improve Outcome: Progressing   Problem: Safety: Goal: Ability to remain free from injury will improve Outcome: Progressing

## 2019-08-20 NOTE — Progress Notes (Signed)
Heparin infusion stopped per MD order.

## 2019-08-21 LAB — CBC
HCT: 26.3 % — ABNORMAL LOW (ref 36.0–46.0)
Hemoglobin: 8.9 g/dL — ABNORMAL LOW (ref 12.0–15.0)
MCH: 31.7 pg (ref 26.0–34.0)
MCHC: 33.8 g/dL (ref 30.0–36.0)
MCV: 93.6 fL (ref 80.0–100.0)
Platelets: 216 10*3/uL (ref 150–400)
RBC: 2.81 MIL/uL — ABNORMAL LOW (ref 3.87–5.11)
RDW: 14.6 % (ref 11.5–15.5)
WBC: 8.6 10*3/uL (ref 4.0–10.5)
nRBC: 0 % (ref 0.0–0.2)

## 2019-08-21 LAB — BASIC METABOLIC PANEL
Anion gap: 7 (ref 5–15)
BUN: 78 mg/dL — ABNORMAL HIGH (ref 8–23)
CO2: 18 mmol/L — ABNORMAL LOW (ref 22–32)
Calcium: 8.4 mg/dL — ABNORMAL LOW (ref 8.9–10.3)
Chloride: 112 mmol/L — ABNORMAL HIGH (ref 98–111)
Creatinine, Ser: 2.1 mg/dL — ABNORMAL HIGH (ref 0.44–1.00)
GFR calc Af Amer: 23 mL/min — ABNORMAL LOW (ref >60)
GFR calc non Af Amer: 20 mL/min — ABNORMAL LOW (ref >60)
Glucose, Bld: 105 mg/dL — ABNORMAL HIGH (ref 70–99)
Potassium: 4.8 mmol/L (ref 3.5–5.1)
Sodium: 137 mmol/L (ref 135–145)

## 2019-08-21 LAB — GLUCOSE, CAPILLARY
Glucose-Capillary: 97 mg/dL (ref 70–99)
Glucose-Capillary: 131 mg/dL — ABNORMAL HIGH (ref 70–99)
Glucose-Capillary: 122 mg/dL — ABNORMAL HIGH (ref 70–99)

## 2019-08-21 LAB — MAGNESIUM: Magnesium: 1.8 mg/dL (ref 1.7–2.4)

## 2019-08-21 MED ORDER — CALAMINE EX LOTN
TOPICAL_LOTION | CUTANEOUS | Status: DC | PRN
  Filled 2019-08-21: qty 177

## 2019-08-21 MED ORDER — HYDRALAZINE HCL 50 MG PO TABS
100.0000 mg | ORAL_TABLET | Freq: Three times a day (TID) | ORAL | Status: DC
  Administered 2019-08-21 – 2019-08-23 (×7): 100 mg via ORAL
  Filled 2019-08-21 (×7): qty 2

## 2019-08-21 NOTE — TOC Progression Note (Signed)
Transition of Care [facility]) - Progression Note    Patient Details  Name: Amy [name] MRN: [id number] Date of Birth: 1927/12/09  Transition of Care [facility]) CM/SW Contact  Zigmund Daniel Dorian Pod, RN Phone [place] 08/21/2019, 3:38 PM  Clinical Narrative:    Pt is a 86 y/o pt admitted with NSTEMI. PT/OT completed with recommendations for SNF placement. Spoke with pt at bedside who agreed to placement however requested [facility] team to speak with her daughter [name] for confirmation. Pt lives alone and states she was hospitalized before and declined SNF but had a fall. Pt states she will go the SNF with this discharge to help ambulate better and get stronger. Placement process discussed and will present bed offers once received to the pt.  FL2 and PASSR completed.   Expected Discharge Plan: [place] Barriers to Discharge: No Barriers Identified  Expected Discharge Plan and Services Expected Discharge Plan: [place] In-house Referral: Clinical Social Work Discharge Planning Services: CM Consult Post Acute Care Choice: [place] arrangements for the past 2 months: [place] Determinants of Health (SDOH) Interventions    Readmission Risk Interventions Readmission Risk Prevention Plan 08/15/2019  Transportation Screening Complete  Medication Review Press photographer) Complete  PCP or Specialist appointment within 3-5 days of discharge Complete  HRI or [place] Complete  SW Recovery Care/Counseling Consult Complete  [place] Not Applicable  Some recent data might be hidden

## 2019-08-21 NOTE — Evaluation (Signed)
Physical Therapy Evaluation Patient Details Name: Amy [name] MRN: [id number] DOB: 1927-06-05 Today's Date: 08/21/2019   History of Present Illness  86 y.o. 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  Pt was admitted last week with syncope, hyperkalemia - discharged home and return now after continued weakness, fall.   Clinical Impression  Pt very pleasant and showed good effort t/o the PT exam.  She was on room air t/o the session with sats always in the high 90s and HR stayed stable/appropriate, however she started to fatigue very quickly and after just 30 ft of ambulation pt had DOE and requested to sit rather abruptly 2/2 to significant fatigue.  She was not interested in rehab when admitted last week but acknowledges that she struggled more than she expected at home and knows she needs to go to rehab to build up strength before being safe at home.      Follow Up Recommendations SNF    Equipment Recommendations  None recommended by PT    Recommendations for Other Services       Precautions / Restrictions Precautions Precautions: Fall Restrictions Weight Bearing Restrictions: No      Mobility  Bed Mobility Overal bed mobility: Needs Assistance Bed Mobility: Supine to Sit     Supine to sit: Min assist     General bed mobility comments: Pt with slow transition to EOB, but able to rise with only light HHA to get trunk to fully upright.  Unable to get to upright independently even with heavy use of rails  Transfers Overall transfer level: Needs assistance Equipment used: Rolling walker (2 wheeled) Transfers: Sit to/from Stand Sit to Stand: Min guard         General transfer comment: Pt was able to rise w/o  direct PT assist, however on initial attempt she could not attain fully upright and needed to sit back down and adjust positiong; further cuing for set up and sequencing  Ambulation/Gait Ambulation/Gait assistance: Min guard Gait Distance (Feet): 35 Feet Assistive device: Rolling walker (2 wheeled)       General Gait Details: Pt with slow and labored but overall safe ambulation, pt clearly reliant on walker (reports significnatly less need for ADs at baseline).  Her vitals remained appropriate and stable w/o the effort but pt had significant fatigue with the effort with quickly increasing RPE with modest effort.  Stairs            Wheelchair Mobility    Modified Rankin (Stroke Patients Only)       Balance Overall balance assessment: Needs assistance Sitting-balance support: No upper extremity supported;Feet supported Sitting balance-Leahy Scale: Good     Standing balance support: Bilateral upper extremity supported Standing balance-Leahy Scale: Fair Standing balance comment: reliant on walker in standing no LOBs or buckling                             Pertinent Vitals/Pain Pain Assessment: No/denies pain    Home Living Family/patient expects to be discharged to:: Skilled nursing facility Living Arrangements: Alone Available Help at Discharge: Family;Available PRN/intermittently Type of Home: House Home Access: Stairs to enter Entrance Stairs-Rails: Left Entrance Stairs-Number of Steps: 5 Home Layout: One level Home Equipment: Walker - 2 wheels;Walker - 4 wheels;Cane - single point  Additional Comments: Lives in "guest house" on granddaughter's property; family very involved, supportive with care    Prior Function Level of Independence: Independent with assistive device(s)               Hand Dominance        Extremity/Trunk Assessment   Upper Extremity Assessment Upper Extremity Assessment: Generalized weakness (b/l shoulder elevation to  ~90, age appropriate limitations)    Lower Extremity Assessment Lower Extremity Assessment: Generalized weakness;Overall WFL for tasks assessed (age appropraite defecits)       Communication   Communication: No difficulties  Cognition Arousal/Alertness: Awake/alert Behavior During Therapy: WFL for tasks assessed/performed Overall Cognitive Status: Within Functional Limits for tasks assessed                                        General Comments      Exercises     Assessment/Plan    PT Assessment Patient needs continued PT services  PT Problem List Decreased strength;Decreased activity tolerance;Decreased balance;Decreased mobility;Decreased coordination;Decreased knowledge of use of DME;Decreased safety awareness;Cardiopulmonary status limiting activity;Decreased knowledge of precautions       PT Treatment Interventions DME instruction;Gait training;Stair training;Functional mobility training;Therapeutic exercise;Therapeutic activities;Balance training;Patient/family education    PT Goals (Current goals can be found in the Care Plan section)  Acute Rehab PT Goals Patient Stated Goal: get stronger at rehab before going home  PT Goal Formulation: With patient/family Time For Goal Achievement: 09/04/19 Potential to Achieve Goals: Good    Frequency Min 2X/week   Barriers to discharge        Co-evaluation               AM-PAC PT "6 Clicks" Mobility  Outcome Measure Help needed turning from your back to your side while in a flat bed without using bedrails?: None Help needed moving from lying on your back to sitting on the side of a flat bed without using bedrails?: A Little Help needed moving to and from a bed to a chair (including a wheelchair)?: A Little Help needed standing up from a chair using your arms (e.g., wheelchair or bedside chair)?: A Little Help needed to walk in hospital room?: A Little Help needed climbing 3-5 steps with a railing?  : A Lot 6 Click Score: 18    End of Session Equipment Utilized During Treatment: Gait belt Activity Tolerance: Patient tolerated treatment well Patient left: with chair alarm set;with call bell/phone within reach Nurse Communication: Mobility status PT Visit Diagnosis: Muscle weakness (generalized) (M62.81);Difficulty in walking, not elsewhere classified (R26.2)    Time: 2409-7353 PT Time Calculation (min) (ACUTE ONLY): 29 min   Charges:   PT Evaluation $PT Eval Low Complexity: 1 Low PT Treatments $Therapeutic Activity: 8-22 mins        Kreg Shropshire, DPT  08/21/2019, 11:52 AM

## 2019-08-21 NOTE — Plan of Care (Signed)
VSS throughout shift, no falls during shift. Pt c/o pain to right side where shingles are located; scheduled calamine provided. Pt could not tolerate ambulating in hall, ambulated to bathroom independently. NSR on tele. Problem: Education: Goal: Knowledge of General Education information will improve Description: Including pain rating scale, medication(s)/side effects and non-pharmacologic comfort measures Outcome: Progressing   Problem: Activity: Goal: Risk for activity intolerance will decrease Outcome: Not Progressing   Problem: Nutrition: Goal: Adequate nutrition will be maintained Outcome: Not Progressing   Problem: Pain Managment: Goal: General experience of comfort will improve Outcome: Progressing   Problem: Skin Integrity: Goal: Risk for impaired skin integrity will decrease Outcome: Not Progressing

## 2019-08-21 NOTE — Progress Notes (Deleted)
Pt just have an INR of 6.10. [place] Made aware. On assessment pt has no signs of bleeding at this time. Will continue to monitor.

## 2019-08-21 NOTE — Plan of Care (Signed)
  Problem: Health Behavior/Discharge Planning: Goal: Ability to manage health-related needs will improve Outcome: Progressing   Problem: Clinical Measurements: Goal: Will remain free from infection Outcome: Progressing Goal: Diagnostic test results will improve Outcome: Progressing   Problem: Pain Managment: Goal: General experience of comfort will improve Outcome: Progressing   Problem: Safety: Goal: Ability to remain free from injury will improve Outcome: Progressing

## 2019-08-21 NOTE — NC FL2 (Signed)
[place] LEVEL OF CARE SCREENING TOOL     IDENTIFICATION  Patient Name: Amy [name] Birthdate: 1927/07/30 Sex: female Admission Date (Current Location): 08/18/2019  [place] and [place] Number:  Engineering geologist and Address:  [facility], [address] [place], [place] [address]      Provider Number: [id number]  Attending Physician Name and Address:  Enzo Bi, MD  Relative Name and Phone Number:  Amyrah Pinkhasov [phone]    Current Level of Care: Hospital Recommended Level of Care: [place] Prior Approval Number:    Date Approved/Denied:   PASRR Number: [id number]  Discharge Plan: SNF    Current Diagnoses: Patient Active Problem List   Diagnosis Date Noted  . NSTEMI (non-ST elevated myocardial infarction) ([place]) 08/18/2019  . Syncope and collapse 08/13/2019  . Unresponsiveness 08/12/2019  . Hyperkalemia 08/12/2019  . HTN (hypertension) 08/12/2019  . GERD (gastroesophageal reflux disease) 08/12/2019  . Hypothyroidism 08/12/2019  . CAD (coronary artery disease) 08/12/2019  . Shingles 08/12/2019  . Carotid arterial disease ([place])   . Hypotension   . Groin pain, right   . Chest pain 08/06/2019  . Renovascular hypertension 03/08/2019  . Renal artery stenosis ([place]) 01/28/2019  . Multiple renal cysts 12/29/2018  . Renal stone 12/29/2018  . CKD (chronic kidney disease) stage 4, GFR 15-29 ml/min (HCC) 11/23/2018  . Diabetes ([place]) 06/29/2017  . CAP (community acquired pneumonia) 05/16/2017  . GI bleed 09/21/2016  . Vitamin D deficiency 11/04/2015  . Multiple lung nodules   . COPD with chronic bronchitis ([place])   . New onset atrial fibrillation ([place]) 10/04/2015  . Anemia 10/04/2015  . Acute kidney injury superimposed on CKD ([place]) 10/03/2015  . Palliative care encounter 06/05/2015  . Morbid obesity ([place]) 12/05/2014  . Angina pectoris associated with type 2 diabetes mellitus ([place]) 12/05/2014  .  Type 2 diabetes mellitus with hyperglycemia ([place]) 08/22/2014  . Chronic diastolic CHF (congestive heart failure) ([place]) 09/06/2013  . Preop cardiovascular exam 06/03/2012  . Iron deficiency anemia 09/08/2011  . HLD (hyperlipidemia) 07/18/2009  . Coronary artery disease involving native coronary artery of native heart without angina pectoris 07/18/2009  . HYPOTHYROIDISM-IATROGENIC 07/12/2009  . Essential hypertension 07/12/2009    Orientation RESPIRATION BLADDER Height & Weight     Self, Time, Situation, Place  Normal Continent Weight: 77.6 kg Height:  5\' 2"  (157.5 cm)  BEHAVIORAL SYMPTOMS/MOOD NEUROLOGICAL BOWEL NUTRITION STATUS      Continent Diet  AMBULATORY STATUS COMMUNICATION OF NEEDS Skin   Limited Assist Verbally Normal                       Personal Care Assistance Level of Assistance  Bathing, Feeding, Dressing Bathing Assistance: Limited assistance Feeding assistance: Limited assistance Dressing Assistance: Limited assistance     Functional Limitations Info  Sight, Hearing, Speech Sight Info: Adequate Hearing Info: Adequate Speech Info: Adequate    SPECIAL CARE FACTORS FREQUENCY  PT (By licensed PT), OT (By licensed OT)     PT Frequency: 5X per week OT Frequency: 5X per week            Contractures Contractures Info: Not present    Additional Factors Info  Code Status Code Status Info: DNR             Current Medications (08/21/2019):  This is the current hospital active medication list Current Facility-Administered Medications  Medication Dose Route Frequency Provider Last Rate Last Admin  . acetaminophen (  TYLENOL) tablet 1,000 mg  1,000 mg Oral TID Enzo Bi, MD   1,000 mg at 08/21/19 1211  . aspirin EC tablet 81 mg  81 mg Oral Daily Athena Masse, MD   81 mg at 08/21/19 0845  . calamine lotion   Topical QID Lang Snow, NP   Given at 08/21/19 1222  . clopidogrel (PLAVIX) tablet 75 mg  75 mg Oral Daily Enzo Bi, MD   75 mg at  08/21/19 0845  . fluticasone furoate-vilanterol (BREO ELLIPTA) 200-25 MCG/INH 1 puff  1 puff Inhalation Daily Enzo Bi, MD   1 puff at 08/21/19 1217  . heparin injection 5,000 Units  5,000 Units Subcutaneous Q8H Enzo Bi, MD   5,000 Units at 08/21/19 1211  . hydrALAZINE (APRESOLINE) tablet 100 mg  100 mg Oral TID Enzo Bi, MD   100 mg at 08/21/19 1211  . insulin aspart (novoLOG) injection 0-15 Units  0-15 Units Subcutaneous TID WC Athena Masse, MD   3 Units at 08/21/19 1211  . insulin aspart (novoLOG) injection 0-5 Units  0-5 Units Subcutaneous QHS Athena Masse, MD   2 Units at 08/18/19 2238  . ipratropium-albuterol (DUONEB) 0.5-2.5 (3) MG/3ML nebulizer solution 3 mL  3 mL Nebulization Q4H PRN Athena Masse, MD      . levothyroxine (SYNTHROID) tablet 88 mcg  88 mcg Oral Q0600 Enzo Bi, MD   88 mcg at 08/21/19 0520  . nitroGLYCERIN (NITROSTAT) SL tablet 0.4 mg  0.4 mg Sublingual Q5 Min x 3 PRN Athena Masse, MD      . ondansetron [facility]) injection 4 mg  4 mg Intravenous Q6H PRN Athena Masse, MD      . oxybutynin (DITROPAN-XL) 24 hr tablet 5 mg  5 mg Oral Daily Enzo Bi, MD   5 mg at 08/21/19 0844  . pantoprazole (PROTONIX) EC tablet 20 mg  20 mg Oral Daily Enzo Bi, MD   20 mg at 08/21/19 0844  . simvastatin (ZOCOR) tablet 40 mg  40 mg Oral QHS Athena Masse, MD   40 mg at 08/20/19 2052  . valACYclovir (VALTREX) tablet 1,000 mg  1,000 mg Oral BID Enzo Bi, MD   1,000 mg at 08/21/19 0845   Facility-Administered Medications Ordered in Other Encounters  Medication Dose Route Frequency Provider Last Rate Last Admin  . 0.9 %  sodium chloride infusion   Intravenous Continuous Lloyd Huger, MD      . epoetin alfa-epbx (RETACRIT) injection 40,000 Units  40,000 Units Subcutaneous Once Lloyd Huger, MD         Discharge Medications: Please see discharge summary for a list of discharge medications.  Relevant Imaging Results:  Relevant Lab Results:   Additional  Information SS#: [id number]  Harriet Masson, RN

## 2019-08-21 NOTE — Progress Notes (Signed)
PROGRESS NOTE    Amy [name]  OHY:[id number] DOB: 03-22-1927 DOA: 08/18/2019 PCP: [name], MD    Assessment & Plan:   Principal Problem:   NSTEMI (non-ST elevated myocardial infarction) [facility]) Active Problems:   Essential hypertension   Coronary artery disease involving native coronary artery of native heart without angina pectoris   Acute kidney injury superimposed on CKD ([place])   COPD with chronic bronchitis (HCC)   CKD (chronic kidney disease) stage 4, GFR 15-29 ml/min (HCC)   Type 2 diabetes mellitus with hyperglycemia ([place])   Hypothyroidism   Syncope and collapse    Amy [name] is a 86 y.o. Caucasian female with medical history significant for COPD, HTN, diet-controlled DM, CKD 4, diastolic CHF, CAD with history of DES to LAD, recent shingles, recently hospitalized from 6/18 to 6/21 with a syncopal episode, and chest pain for which she received IV heparin, ruled out for ACS, evaluated by cardiology at that time, who presents to the emergency room following another episode in which she felt weak and fell hitting her head with possible loss of consciousness.     Trop elevation due to demand ischemia Coronary artery disease involving native coronary artery of native heart without angina pectoris -Patient presented with a syncopal episode, history of left arm pain 2 days prior, no acute changes on EKG, but troponin elevation of 733.  She was hospitalized on 08/12/2019 with more typical symptoms but with flat troponin trend in the 20s --Recent echo 08/14/2019 showed normal -Cardiology consulted, who ruled out ACS LVEF.  Heparin gtt d/c'ed PLAN: -Continue home statin.   --do not start metoprolol due to hx of associated bradycardia  -continue home ASA and Plavix --discharged with a 14-day event monitor, per cards.    Syncope and collapse -Patient fell with loss of consciousness and this was preceded with weakness -Head and C-spine CT showing no acute  injury -Patient recently hospitalized a week ago with syncope PLAN: -Continuous cardiac monitoring --obtain orthostatics --discharged with a 14-day event monitor, per cards. --PT rec SNF rehab    Acute kidney injury superimposed on CKD stage IV (HCC) -Creatinine 3.14 elevated above baseline of 2.04 --MIVF d/c'ed -Monitor renal function and avoid nephrotoxins    Essential hypertension -Home BP meds were held PTA due to soft pressures.  --Resume hydralazine at 100 mg q8h due to now elevated BP      COPD with chronic bronchitis ([place]) -Not acutely exacerbated.  Continue home inhalers with duo nebs as needed     Type 2 diabetes mellitus with hyperglycemia ([place]) -Patient is diet-controlled.  Sliding scale insulin coverage only    Hypothyroidism -Continue levothyroxine  Pain with Shingles, right T 4 dermatome --continue home Valtrex --tylenol 1g TID scheduled --calamine lotion PRN   DVT prophylaxis: Heparin SQ Code Status: DNR  Family Communication:  Status is: inpatient Dispo:   The patient is from: home Anticipated d/c is to: SNF rehab Anticipated d/c date is: when bed available  Patient currently is medically stable to d/c.   Subjective and Interval History:  Pt again mainly complained about pain from her shingles.  Reported dyspnea.  No fever, abdominal pain, N/V/D.   Objective: Vitals:   08/20/19 1954 08/21/19 0317 08/21/19 0735 08/21/19 1149  BP: (!) 170/36 (!) 151/40 (!) 161/38 (!) 165/37  Pulse: (!) 58 67 65 69  Resp: 20 18 17 17   Temp: 97.8 F (36.6 C) 98.4 F (36.9 C) 97.7 F (36.5 C) (!) 97.5 F (36.4 C)  TempSrc: Oral Oral Oral Oral  SpO2: 100% 100% 99% 100%  Weight:  77.6 kg    Height:        Intake/Output Summary (Last 24 hours) at 08/21/2019 1351 Last data filed at 08/21/2019 0300 Gross per 24 hour  Intake 237 ml  Output 400 ml  Net -163 ml   Filed Weights   08/18/19 1656 08/21/19 0317  Weight: 77.1 kg 77.6 kg    Examination:    Constitutional: NAD, AAOx3 HEENT: conjunctivae and lids normal, EOMI CV: RRR 2+ systolic murmur. Distal pulses +2.  No cyanosis.   RESP: CTA B/L, normal respiratory effort  GI: +BS, NTND Extremities: No effusions, edema, or tenderness in BLE SKIN: warm, dry.  Groups of small scabs and across right front chest Neuro: II - XII grossly intact.  Sensation intact Psych: depressed mood and affect.     Data Reviewed: I have personally reviewed following labs and imaging studies  CBC: Recent Labs  Lab 08/18/19 1716 08/19/19 0641 08/20/19 0735 08/21/19 0407  WBC 11.4* 11.7* 9.2 8.6  HGB 9.7* 8.4* 9.0* 8.9*  HCT 28.9* 25.0* 27.1* 26.3*  MCV 94.4 93.6 94.8 93.6  PLT 229 210 213 947   Basic Metabolic Panel: Recent Labs  Lab 08/15/19 0709 08/15/19 0709 08/17/19 1156 08/18/19 1716 08/19/19 0641 08/20/19 0449 08/21/19 0407  NA 133*  --   --  131* 137 137 137  K 5.1   < > 4.9 4.6 4.3 4.9 4.8  CL 105  --   --  104 112* 112* 112*  CO2 22  --   --  15* 19* 19* 18*  GLUCOSE 168*  --   --  284* 127* 111* 105*  BUN 68*  --   --  98* 100* 93* 78*  CREATININE 2.04*  --   --  3.14* 2.82* 2.31* 2.10*  CALCIUM 8.6*  --   --  8.9 8.3* 8.3* 8.4*  MG  --   --   --   --   --  2.0 1.8   < > = values in this interval not displayed.   GFR: Estimated Creatinine Clearance: 17.2 mL/min (A) (by C-G formula based on SCr of 2.1 mg/dL (H)). Liver Function Tests: No results for input(s): AST, ALT, ALKPHOS, BILITOT, PROT, ALBUMIN in the last 168 hours. No results for input(s): LIPASE, AMYLASE in the last 168 hours. No results for input(s): AMMONIA in the last 168 hours. Coagulation Profile: Recent Labs  Lab 08/18/19 2215  INR 1.3*   Cardiac Enzymes: No results for input(s): CKTOTAL, CKMB, CKMBINDEX, TROPONINI in the last 168 hours. BNP (last 3 results) No results for input(s): PROBNP in the last 8760 hours. HbA1C: Recent Labs    08/18/19 2215  HGBA1C 5.6   CBG: Recent Labs  Lab  08/20/19 1206 08/20/19 1710 08/20/19 2045 08/21/19 0737 08/21/19 1150  GLUCAP 147* 120* 118* 97 131*   Lipid Profile: Recent Labs    08/19/19 0641  CHOL 110  HDL 37*  LDLCALC 62  TRIG 55  CHOLHDL 3.0   Thyroid Function Tests: No results for input(s): TSH, T4TOTAL, FREET4, T3FREE, THYROIDAB in the last 72 hours. Anemia Panel: No results for input(s): VITAMINB12, FOLATE, FERRITIN, TIBC, IRON, RETICCTPCT in the last 72 hours. Sepsis Labs: No results for input(s): PROCALCITON, LATICACIDVEN in the last 168 hours.  Recent Results (from the past 240 hour(s))  SARS Coronavirus 2 by RT PCR (hospital order, performed in [facility] hospital lab) Nasopharyngeal Nasopharyngeal Swab  Status: None   Collection Time: 08/12/19  2:39 PM   Specimen: Nasopharyngeal Swab  Result Value Ref Range Status   SARS Coronavirus 2 NEGATIVE NEGATIVE Final    Comment: (NOTE) SARS-CoV-2 target nucleic acids are NOT DETECTED.  The SARS-CoV-2 RNA is generally detectable in upper and lower respiratory specimens during the acute phase of infection. The lowest concentration of SARS-CoV-2 viral copies this assay can detect is 250 copies / mL. A negative result does not preclude SARS-CoV-2 infection and should not be used as the sole basis for treatment or other patient management decisions.  A negative result may occur with improper specimen collection / handling, submission of specimen other than nasopharyngeal swab, presence of viral mutation(s) within the areas targeted by this assay, and inadequate number of viral copies (<250 copies / mL). A negative result must be combined with clinical observations, patient history, and epidemiological information.  Fact Sheet for Patients:   StrictlyIdeas.no  Fact Sheet for Healthcare Providers: BankingDealers.co.za  This test is not yet approved or  cleared by the [place] FDA and has been authorized for  detection and/or diagnosis of SARS-CoV-2 by FDA under an Emergency Use Authorization (EUA).  This EUA will remain in effect (meaning this test can be used) for the duration of the COVID-19 declaration under Section 564(b)(1) of the Act, 21 U.S.C. section 360bbb-3(b)(1), unless the authorization is terminated or revoked sooner.  Performed at [facility], [address], [place], [place] [address]   Urine Culture     Status: Abnormal   Collection Time: 08/13/19  6:30 PM   Specimen: Urine, Random  Result Value Ref Range Status   Specimen Description   Final    URINE, RANDOM Performed at [facility], [address] [place], [place] [address]    Special Requests   Final    NONE Performed at [facility], [address] [place], [place] [address]    Culture (A)  Final    70,000 COLONIES/mL MULTIPLE SPECIES PRESENT, SUGGEST RECOLLECTION   Report Status 08/15/2019 FINAL  Final  SARS Coronavirus 2 by RT PCR (hospital order, performed in [facility] hospital lab) Nasopharyngeal Nasopharyngeal Swab     Status: None   Collection Time: 08/18/19  7:08 PM   Specimen: Nasopharyngeal Swab  Result Value Ref Range Status   SARS Coronavirus 2 NEGATIVE NEGATIVE Final    Comment: (NOTE) SARS-CoV-2 target nucleic acids are NOT DETECTED.  The SARS-CoV-2 RNA is generally detectable in upper and lower respiratory specimens during the acute phase of infection. The lowest concentration of SARS-CoV-2 viral copies this assay can detect is 250 copies / mL. A negative result does not preclude SARS-CoV-2 infection and should not be used as the sole basis for treatment or other patient management decisions.  A negative result may occur with improper specimen collection / handling, submission of specimen other than nasopharyngeal swab, presence of viral mutation(s) within the areas targeted by this assay, and inadequate number of viral copies (<250 copies / mL). A negative  result must be combined with clinical observations, patient history, and epidemiological information.  Fact Sheet for Patients:   StrictlyIdeas.no  Fact Sheet for Healthcare Providers: BankingDealers.co.za  This test is not yet approved or  cleared by the [place] FDA and has been authorized for detection and/or diagnosis of SARS-CoV-2 by FDA under an Emergency Use Authorization (EUA).  This EUA will remain in effect (meaning this test can be used) for the duration of the COVID-19  declaration under Section 564(b)(1) of the Act, 21 U.S.C. section 360bbb-3(b)(1), unless the authorization is terminated or revoked sooner.  Performed at [facility], [address], [place], [place] [address]       Radiology Studies: No results found.   Scheduled Meds: . acetaminophen  1,000 mg Oral TID  . aspirin EC  81 mg Oral Daily  . calamine   Topical QID  . clopidogrel  75 mg Oral Daily  . fluticasone furoate-vilanterol  1 puff Inhalation Daily  . heparin injection (subcutaneous)  5,000 Units Subcutaneous Q8H  . hydrALAZINE  100 mg Oral TID  . insulin aspart  0-15 Units Subcutaneous TID WC  . insulin aspart  0-5 Units Subcutaneous QHS  . levothyroxine  88 mcg Oral Q0600  . oxybutynin  5 mg Oral Daily  . pantoprazole  20 mg Oral Daily  . simvastatin  40 mg Oral QHS  . valACYclovir  1,000 mg Oral BID   Continuous Infusions:   LOS: 2 days     Enzo Bi, MD [facility] If 7PM-7AM, please contact night-coverage 08/21/2019, 1:51 PM

## 2019-08-22 ENCOUNTER — Ambulatory Visit: Admission: RE | Admit: 2019-08-22 | Payer: Medicare Other | Source: Ambulatory Visit

## 2019-08-22 ENCOUNTER — Telehealth: Payer: Self-pay | Admitting: *Deleted

## 2019-08-22 ENCOUNTER — Ambulatory Visit: Payer: Medicare Other | Admitting: Cardiovascular Disease

## 2019-08-22 DIAGNOSIS — R55 Syncope and collapse: Secondary | ICD-10-CM

## 2019-08-22 LAB — CBC
HCT: 24.5 % — ABNORMAL LOW (ref 36.0–46.0)
Hemoglobin: 8.5 g/dL — ABNORMAL LOW (ref 12.0–15.0)
MCH: 32.4 pg (ref 26.0–34.0)
MCHC: 34.7 g/dL (ref 30.0–36.0)
MCV: 93.5 fL (ref 80.0–100.0)
Platelets: 189 10*3/uL (ref 150–400)
RBC: 2.62 MIL/uL — ABNORMAL LOW (ref 3.87–5.11)
RDW: 14.7 % (ref 11.5–15.5)
WBC: 7.2 10*3/uL (ref 4.0–10.5)
nRBC: 0 % (ref 0.0–0.2)

## 2019-08-22 LAB — GLUCOSE, CAPILLARY
Glucose-Capillary: 196 mg/dL — ABNORMAL HIGH (ref 70–99)
Glucose-Capillary: 109 mg/dL — ABNORMAL HIGH (ref 70–99)
Glucose-Capillary: 141 mg/dL — ABNORMAL HIGH (ref 70–99)
Glucose-Capillary: 127 mg/dL — ABNORMAL HIGH (ref 70–99)
Glucose-Capillary: 136 mg/dL — ABNORMAL HIGH (ref 70–99)

## 2019-08-22 LAB — BASIC METABOLIC PANEL
Anion gap: 6 (ref 5–15)
BUN: 70 mg/dL — ABNORMAL HIGH (ref 8–23)
CO2: 20 mmol/L — ABNORMAL LOW (ref 22–32)
Calcium: 8.4 mg/dL — ABNORMAL LOW (ref 8.9–10.3)
Chloride: 113 mmol/L — ABNORMAL HIGH (ref 98–111)
Creatinine, Ser: 1.87 mg/dL — ABNORMAL HIGH (ref 0.44–1.00)
GFR calc Af Amer: 27 mL/min — ABNORMAL LOW (ref >60)
GFR calc non Af Amer: 23 mL/min — ABNORMAL LOW (ref >60)
Glucose, Bld: 116 mg/dL — ABNORMAL HIGH (ref 70–99)
Potassium: 4.8 mmol/L (ref 3.5–5.1)
Sodium: 139 mmol/L (ref 135–145)

## 2019-08-22 LAB — MAGNESIUM: Magnesium: 1.8 mg/dL (ref 1.7–2.4)

## 2019-08-22 MED ORDER — GABAPENTIN 300 MG PO CAPS: 300.0000 mg | ORAL_CAPSULE | Freq: Every day | ORAL | Status: DC

## 2019-08-22 MED ORDER — GABAPENTIN 300 MG PO CAPS
300.0000 mg | ORAL_CAPSULE | Freq: Three times a day (TID) | ORAL | Status: DC
  Administered 2019-08-22 (×2): 300 mg via ORAL
  Filled 2019-08-22 (×2): qty 1

## 2019-08-22 MED ORDER — GABAPENTIN 100 MG PO CAPS
100.0000 mg | ORAL_CAPSULE | Freq: Every day | ORAL | Status: DC
  Administered 2019-08-23: 100 mg via ORAL
  Filled 2019-08-22: qty 1

## 2019-08-22 NOTE — Care Management Important Message (Signed)
Important Message  Patient Details  Name: Amy [name] MRN: [id number] Date of Birth: Mar 28, 1927   Medicare Important Message Given:  Yes     Dannette Barbara 08/22/2019, 11:51 AM

## 2019-08-22 NOTE — Plan of Care (Signed)
  Problem: Health Behavior/Discharge Planning: Goal: Ability to manage health-related needs will improve Outcome: Progressing   Problem: Clinical Measurements: Goal: Cardiovascular complication will be avoided Outcome: Progressing   Problem: Activity: Goal: Risk for activity intolerance will decrease Outcome: Progressing   Problem: Pain Managment: Goal: General experience of comfort will improve Outcome: Progressing   Problem: Safety: Goal: Ability to remain free from injury will improve Outcome: Progressing

## 2019-08-22 NOTE — Progress Notes (Signed)
Physical Therapy Treatment Patient Details Name: Amy [name] MRN: [id number] DOB: 1927-11-10 Today's Date: 08/22/2019    History of Present Illness 86 y.o. 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  Pt was admitted last week with syncope, hyperkalemia - discharged home and return now after continued weakness, fall.     PT Comments    Pt in chair, daughter in room.  Reports general fatigue and R arm/trunk pain.  She agrees to attempts at therapy.  Stood with min guard to RW.  She is able to stand for a brief time before general fatigue and pain limits her.  She completes LAQ, and ankle pumps in sitting.  Further session deferred at this time.   Follow Up Recommendations  SNF     Equipment Recommendations  None recommended by PT    Recommendations for Other Services       Precautions / Restrictions Precautions Precautions: Fall Restrictions Weight Bearing Restrictions: No    Mobility  Bed Mobility               General bed mobility comments: in reclienr before and after session  Transfers Overall transfer level: Needs assistance Equipment used: Rolling walker (2 wheeled) Transfers: Sit to/from Stand Sit to Stand: Min guard            Ambulation/Gait             General Gait Details: deferred due to pain   Stairs             Wheelchair Mobility    Modified Rankin (Stroke Patients Only)       Balance Overall balance assessment: Needs assistance Sitting-balance support: No upper extremity supported;Feet supported Sitting balance-Leahy Scale: Good     Standing balance support: Bilateral upper extremity supported Standing balance-Leahy Scale: Fair Standing balance comment:  reliant on walker in standing no LOBs or buckling                            Cognition Arousal/Alertness: Awake/alert Behavior During Therapy: WFL for tasks assessed/performed Overall Cognitive Status: Within Functional Limits for tasks assessed                                        Exercises      General Comments        Pertinent Vitals/Pain Pain Assessment: No/denies pain    Home Living                      Prior Function            PT Goals (current goals can now be found in the care plan section) Progress towards PT goals: Progressing toward goals    Frequency    Min 2X/week      PT Plan Current plan remains appropriate    Co-evaluation              AM-PAC PT "6 Clicks" Mobility   Outcome Measure  Help needed turning from your back to your side while in a flat bed without using bedrails?: None Help needed moving from lying on your back to sitting on the side of a flat bed  without using bedrails?: A Little Help needed moving to and from a bed to a chair (including a wheelchair)?: A Little Help needed standing up from a chair using your arms (e.g., wheelchair or bedside chair)?: A Little Help needed to walk in hospital room?: A Little Help needed climbing 3-5 steps with a railing? : A Lot 6 Click Score: 18    End of Session Equipment Utilized During Treatment: Gait belt Activity Tolerance: Patient tolerated treatment well Patient left: with chair alarm set;with call bell/phone within reach;with nursing/sitter in room;with family/visitor present Nurse Communication: Mobility status       Time: 2446-2863 PT Time Calculation (min) (ACUTE ONLY): 15 min  Charges:  $Therapeutic Exercise: 8-22 mins                    [name], PTA 08/22/19, 10:53 AM

## 2019-08-22 NOTE — Progress Notes (Signed)
Today the only medication that brought her shingles pain down below a 7 was Gabapentin. It did make her drowsy but she also didn't sleep well last night per daughter [name] and the patient.  Dr. Billie Ruddy adjusted the dose of the Gabapentin twice today and the last change, I ran by the daughter over the phone and she approved of it.

## 2019-08-22 NOTE — Progress Notes (Signed)
PROGRESS NOTE    Amy [name]  LPF:[id number] DOB: [birth year] DOA: 08/18/2019 PCP: [name], MD    Assessment & Plan:   Principal Problem:   NSTEMI (non-ST elevated myocardial infarction) [facility]) Active Problems:   Essential hypertension   Coronary artery disease involving native coronary artery of native heart without angina pectoris   Acute kidney injury superimposed on CKD ([place])   COPD with chronic bronchitis (HCC)   CKD (chronic kidney disease) stage 4, GFR 15-29 ml/min (HCC)   Type 2 diabetes mellitus with hyperglycemia ([place])   Hypothyroidism   Syncope and collapse    Amy [name] is a 86 y.o. Caucasian female with medical history significant for COPD, HTN, diet-controlled DM, CKD 4, diastolic CHF, CAD with history of DES to LAD, recent shingles, recently hospitalized from 6/18 to 6/21 with a syncopal episode, and chest pain for which she received IV heparin, ruled out for ACS, evaluated by cardiology at that time, who presents to the emergency room following another episode in which she felt weak and fell hitting her head with possible loss of consciousness.     Trop elevation due to demand ischemia Coronary artery disease involving native coronary artery of native heart without angina pectoris -Patient presented with a syncopal episode, history of left arm pain 2 days prior, no acute changes on EKG, but troponin elevation of 733.  She was hospitalized on 08/12/2019 with more typical symptoms but with flat troponin trend in the 20s --Recent echo 08/14/2019 showed normal -Cardiology consulted, who ruled out ACS LVEF.  Heparin gtt d/c'ed PLAN: -Continue home statin.   --do not start metoprolol due to hx of associated bradycardia  -continue home ASA and Plavix --discharged with a 14-day event monitor, per cards.    Syncope and collapse -Patient fell with loss of consciousness and this was preceded with weakness -Head and C-spine CT showing no acute  injury -Patient recently hospitalized a week ago with syncope PLAN: -Continuous cardiac monitoring --discharged with a 14-day event monitor, per cards. --PT rec SNF rehab    Acute kidney injury superimposed on CKD stage IV (HCC) -Creatinine 3.14 elevated above baseline of 2.04 --MIVF d/c'ed -Monitor renal function and avoid nephrotoxins    Essential hypertension -Home BP meds were held PTA due to soft pressures.  --Resume hydralazine at 100 mg q8h due to now elevated BP      COPD with chronic bronchitis ([place]) -Not acutely exacerbated.  Continue home inhalers with duo nebs as needed     Type 2 diabetes mellitus with hyperglycemia ([place]) -Patient is diet-controlled.  Sliding scale insulin coverage only    Hypothyroidism -Continue levothyroxine  Pain with Shingles, right T 4 dermatome --continue home Valtrex --tylenol 1g TID scheduled --calamine lotion PRN --start gabapentin 300 mg nightly and 100 mg daily   DVT prophylaxis: Heparin SQ Code Status: DNR  Family Communication: Daughter updated on the phone today Status is: inpatient Dispo:   The patient is from: home Anticipated d/c is to: SNF rehab Anticipated d/c date is: when bed available  Patient currently is medically stable to d/c.   Subjective and Interval History:  Pt complained of not being able to sleep at night, though was found to be sleeping during the day most of the time.  Continued to complain of Shingles pain.  Found to be sleepy today.  No fever, N/V/D.   Objective: Vitals:   08/22/19 0412 08/22/19 0758 08/22/19 1110 08/22/19 1548  BP: (!) 174/44 (!) 161/46 (!) 163/38 Marland Kitchen)  176/40  Pulse: 79 60 60 79  Resp:  17 18 20   Temp: 97.9 F (36.6 C) (!) 97.5 F (36.4 C) 97.9 F (36.6 C) 97.8 F (36.6 C)  TempSrc: Oral Oral  Oral  SpO2: 100% 100% 100% 98%  Weight: 76.9 kg     Height:        Intake/Output Summary (Last 24 hours) at 08/22/2019 1750 Last data filed at 08/22/2019 1345 Gross per 24 hour   Intake 600 ml  Output 500 ml  Net 100 ml   Filed Weights   08/18/19 1656 08/21/19 0317 08/22/19 0412  Weight: 77.1 kg 77.6 kg 76.9 kg    Examination:   Constitutional: NAD, AAOx3 HEENT: conjunctivae and lids normal, EOMI CV: RRR 2+ systolic murmur. Distal pulses +2.  No cyanosis.   RESP: CTA B/L, normal respiratory effort  GI: +BS, NTND Extremities: No effusions, edema, or tenderness in BLE SKIN: warm, dry.  Groups of small scabs and across right front chest Neuro: II - XII grossly intact.  Sensation intact Psych: depressed mood and affect.     Data Reviewed: I have personally reviewed following labs and imaging studies  CBC: Recent Labs  Lab 08/18/19 1716 08/19/19 0641 08/20/19 0735 08/21/19 0407 08/22/19 0616  WBC 11.4* 11.7* 9.2 8.6 7.2  HGB 9.7* 8.4* 9.0* 8.9* 8.5*  HCT 28.9* 25.0* 27.1* 26.3* 24.5*  MCV 94.4 93.6 94.8 93.6 93.5  PLT 229 210 213 216 497   Basic Metabolic Panel: Recent Labs  Lab 08/18/19 1716 08/19/19 0641 08/20/19 0449 08/21/19 0407 08/22/19 0616  NA 131* 137 137 137 139  K 4.6 4.3 4.9 4.8 4.8  CL 104 112* 112* 112* 113*  CO2 15* 19* 19* 18* 20*  GLUCOSE 284* 127* 111* 105* 116*  BUN 98* 100* 93* 78* 70*  CREATININE 3.14* 2.82* 2.31* 2.10* 1.87*  CALCIUM 8.9 8.3* 8.3* 8.4* 8.4*  MG  --   --  2.0 1.8 1.8   GFR: Estimated Creatinine Clearance: 19.2 mL/min (A) (by C-G formula based on SCr of 1.87 mg/dL (H)). Liver Function Tests: No results for input(s): AST, ALT, ALKPHOS, BILITOT, PROT, ALBUMIN in the last 168 hours. No results for input(s): LIPASE, AMYLASE in the last 168 hours. No results for input(s): AMMONIA in the last 168 hours. Coagulation Profile: Recent Labs  Lab 08/18/19 2215  INR 1.3*   Cardiac Enzymes: No results for input(s): CKTOTAL, CKMB, CKMBINDEX, TROPONINI in the last 168 hours. BNP (last 3 results) No results for input(s): PROBNP in the last 8760 hours. HbA1C: No results for input(s): HGBA1C in the last  72 hours. CBG: Recent Labs  Lab 08/21/19 1646 08/21/19 2104 08/22/19 0758 08/22/19 1126 08/22/19 1634  GLUCAP 122* 196* 109* 141* 127*   Lipid Profile: No results for input(s): CHOL, HDL, LDLCALC, TRIG, CHOLHDL, LDLDIRECT in the last 72 hours. Thyroid Function Tests: No results for input(s): TSH, T4TOTAL, FREET4, T3FREE, THYROIDAB in the last 72 hours. Anemia Panel: No results for input(s): VITAMINB12, FOLATE, FERRITIN, TIBC, IRON, RETICCTPCT in the last 72 hours. Sepsis Labs: No results for input(s): PROCALCITON, LATICACIDVEN in the last 168 hours.  Recent Results (from the past 240 hour(s))  Urine Culture     Status: Abnormal   Collection Time: 08/13/19  6:30 PM   Specimen: Urine, Random  Result Value Ref Range Status   Specimen Description   Final    URINE, RANDOM Performed at [facility], [address], [place], [place] [address]    Special  Requests   Final    NONE Performed at [facility], [address] [place], [place] [address]    Culture (A)  Final    70,000 COLONIES/mL MULTIPLE SPECIES PRESENT, SUGGEST RECOLLECTION   Report Status 08/15/2019 FINAL  Final  SARS Coronavirus 2 by RT PCR (hospital order, performed in [facility] hospital lab) Nasopharyngeal Nasopharyngeal Swab     Status: None   Collection Time: 08/18/19  7:08 PM   Specimen: Nasopharyngeal Swab  Result Value Ref Range Status   SARS Coronavirus 2 NEGATIVE NEGATIVE Final    Comment: (NOTE) SARS-CoV-2 target nucleic acids are NOT DETECTED.  The SARS-CoV-2 RNA is generally detectable in upper and lower respiratory specimens during the acute phase of infection. The lowest concentration of SARS-CoV-2 viral copies this assay can detect is 250 copies / mL. A negative result does not preclude SARS-CoV-2 infection and should not be used as the sole basis for treatment or other patient management decisions.  A negative result may occur with improper specimen collection / handling,  submission of specimen other than nasopharyngeal swab, presence of viral mutation(s) within the areas targeted by this assay, and inadequate number of viral copies (<250 copies / mL). A negative result must be combined with clinical observations, patient history, and epidemiological information.  Fact Sheet for Patients:   StrictlyIdeas.no  Fact Sheet for Healthcare Providers: BankingDealers.co.za  This test is not yet approved or  cleared by the [place] FDA and has been authorized for detection and/or diagnosis of SARS-CoV-2 by FDA under an Emergency Use Authorization (EUA).  This EUA will remain in effect (meaning this test can be used) for the duration of the COVID-19 declaration under Section 564(b)(1) of the Act, 21 U.S.C. section 360bbb-3(b)(1), unless the authorization is terminated or revoked sooner.  Performed at [facility], [address], [place], [place] [address]       Radiology Studies: No results found.   Scheduled Meds: . acetaminophen  1,000 mg Oral TID  . aspirin EC  81 mg Oral Daily  . clopidogrel  75 mg Oral Daily  . fluticasone furoate-vilanterol  1 puff Inhalation Daily  . gabapentin  100 mg Oral Daily  . [START ON 08/23/2019] gabapentin  300 mg Oral QHS  . heparin injection (subcutaneous)  5,000 Units Subcutaneous Q8H  . hydrALAZINE  100 mg Oral TID  . insulin aspart  0-15 Units Subcutaneous TID WC  . insulin aspart  0-5 Units Subcutaneous QHS  . levothyroxine  88 mcg Oral Q0600  . oxybutynin  5 mg Oral Daily  . pantoprazole  20 mg Oral Daily  . simvastatin  40 mg Oral QHS  . valACYclovir  1,000 mg Oral BID   Continuous Infusions:   LOS: 3 days     Enzo Bi, MD [facility] If 7PM-7AM, please contact night-coverage 08/22/2019, 5:50 PM

## 2019-08-22 NOTE — Progress Notes (Signed)
Pt complaints of 8/10 pain on  Right breast and right arm. Talked to [facility] NP and states to give tylenol early. Will continue to monitor.

## 2019-08-22 NOTE — Telephone Encounter (Signed)
-----   Message from Rise Mu, PA-C sent at 08/20/2019 11:53 AM EDT ----- Can you please get her set up with a Zio. Dx: syncope.

## 2019-08-23 ENCOUNTER — Inpatient Hospital Stay (INDEPENDENT_AMBULATORY_CARE_PROVIDER_SITE_OTHER): Payer: Medicare Other

## 2019-08-23 DIAGNOSIS — R55 Syncope and collapse: Secondary | ICD-10-CM

## 2019-08-23 LAB — GLUCOSE, CAPILLARY
Glucose-Capillary: 106 mg/dL — ABNORMAL HIGH (ref 70–99)
Glucose-Capillary: 92 mg/dL (ref 70–99)

## 2019-08-23 LAB — BASIC METABOLIC PANEL
Anion gap: 5 (ref 5–15)
BUN: 58 mg/dL — ABNORMAL HIGH (ref 8–23)
CO2: 20 mmol/L — ABNORMAL LOW (ref 22–32)
Calcium: 8.4 mg/dL — ABNORMAL LOW (ref 8.9–10.3)
Chloride: 113 mmol/L — ABNORMAL HIGH (ref 98–111)
Creatinine, Ser: 1.84 mg/dL — ABNORMAL HIGH (ref 0.44–1.00)
GFR calc Af Amer: 27 mL/min — ABNORMAL LOW (ref >60)
GFR calc non Af Amer: 24 mL/min — ABNORMAL LOW (ref >60)
Glucose, Bld: 111 mg/dL — ABNORMAL HIGH (ref 70–99)
Potassium: 5.3 mmol/L — ABNORMAL HIGH (ref 3.5–5.1)
Sodium: 138 mmol/L (ref 135–145)

## 2019-08-23 LAB — CBC
HCT: 24.3 % — ABNORMAL LOW (ref 36.0–46.0)
Hemoglobin: 8.2 g/dL — ABNORMAL LOW (ref 12.0–15.0)
MCH: 31.9 pg (ref 26.0–34.0)
MCHC: 33.7 g/dL (ref 30.0–36.0)
MCV: 94.6 fL (ref 80.0–100.0)
Platelets: 186 10*3/uL (ref 150–400)
RBC: 2.57 MIL/uL — ABNORMAL LOW (ref 3.87–5.11)
RDW: 15.1 % (ref 11.5–15.5)
WBC: 6.4 10*3/uL (ref 4.0–10.5)
nRBC: 0 % (ref 0.0–0.2)

## 2019-08-23 LAB — MAGNESIUM: Magnesium: 1.7 mg/dL (ref 1.7–2.4)

## 2019-08-23 MED ORDER — NITROGLYCERIN 0.4 MG SL SUBL: 0.4000 mg | SUBLINGUAL_TABLET | SUBLINGUAL | 12 refills | Status: DC | PRN

## 2019-08-23 MED ORDER — SODIUM ZIRCONIUM CYCLOSILICATE 5 G PO PACK
5.0000 g | PACK | Freq: Every day | ORAL | Status: DC
  Filled 2019-08-23: qty 1

## 2019-08-23 MED ORDER — SODIUM ZIRCONIUM CYCLOSILICATE 5 G PO PACK: 5.0000 g | PACK | Freq: Every day | ORAL | Status: DC

## 2019-08-23 MED ORDER — GABAPENTIN 100 MG PO CAPS: 100.0000 mg | ORAL_CAPSULE | Freq: Every day | ORAL | Status: DC

## 2019-08-23 MED ORDER — ASPIRIN 81 MG PO TBEC: 81.0000 mg | DELAYED_RELEASE_TABLET | Freq: Every day | ORAL | 11 refills | Status: DC

## 2019-08-23 MED ORDER — GABAPENTIN 300 MG PO CAPS: 300.0000 mg | ORAL_CAPSULE | Freq: Every day | ORAL | Status: DC

## 2019-08-23 NOTE — Progress Notes (Addendum)
Amy [name] to be D/C'd Skilled nursing facility per MD order. Patient given discharge teaching and paperwork regarding medications, diet, follow-up appointments and activity - in AVS for facility. Patient understanding verbalized. No questions or complaints at this time. Skin condition as charted. IV and telemetry removed prior to leaving.  No further needs by Care Management/Social Work. EMS called for transport by TOC. Report called to Janett Billow RN at facility.  An After Visit Summary was printed and placed in the patient packet.  Patient reports her daughter is aware of discharge and is planning to meet her at the facility.    Amy [name]

## 2019-08-23 NOTE — Telephone Encounter (Signed)
I went to the floor and placed a ZIO AT on the patient prior to discharge for syncope. She is to be discharged to [facility] today.   I have explained this monitor to the patient. She would like me to call her daughter as well.   ZIO AT ID# [id number]  I called and spoke with the patient's daughter, [name] and explained to her that I applied the monitor to the patient and how this works.  [name] states she will be meeting the patient at [facility] later.  I have asked [name] to make sure that the patient has the green and white ZIO box that the monitor came in and that she has the gray "gateway" box with her as well that will transmit data.  I have also asked [name], that when she can, look over the information that came with the monitor and call us with any questions after that as well.   [name] voices understanding and is agreeable.

## 2019-08-23 NOTE — TOC Progression Note (Signed)
Transition of Care [facility]) - Progression Note    Patient Details  Name: Amy [name] MRN: [id number] Date of Birth: 1927-10-17  [facility] CM/SW Contact  Eileen Stanford, LCSW Phone Number: 08/23/2019, 10:33 AM  Clinical Narrative:   Pt has been offered a bed at [facility] and pt's daughter has accepted.  Pt's daughter transports pt to doctor appointments. Pt gets her meds from [facility] [address] still sees [name]. No additional needs noted at this time.    Expected Discharge Plan: [place] Barriers to Discharge: No Barriers Identified  Expected Discharge Plan and Services Expected Discharge Plan: [place] In-house Referral: Clinical Social Work Discharge Planning Services: CM Consult Post Acute Care Choice: [place] arrangements for the past 2 months: Single Family Home                                       Social Determinants of Health (SDOH) Interventions    Readmission Risk Interventions Readmission Risk Prevention Plan 08/23/2019 08/15/2019  Transportation Screening Complete Complete  Medication Review Press photographer) Complete Complete  PCP or Specialist appointment within 3-5 days of discharge Complete Complete  HRI or Home Care Consult Complete Complete  SW Recovery Care/Counseling Consult Complete Complete  Palliative Care Screening Not Applicable Not [place] Complete Not Applicable  Some recent data might be hidden

## 2019-08-23 NOTE — Discharge Summary (Signed)
Physician Discharge Summary   Amy [name]  female DOB: 10-27-1927  WUJ:[id number]  PCP: [name], MD  Admit date: 08/18/2019 Discharge date: 08/23/2019  Admitted From: home Disposition:  SNF CODE STATUS: DNR     Hospital Course:  For full details, please see H&P, progress notes, consult notes and ancillary notes.  Briefly,  Amy [name] a 86 y.o.Caucasian femalewith medical history significant forCOPD, HTN, diet-controlled DM, CKD 4, diastolic CHF, CAD with history of DES to LAD, recent shingles, recently hospitalized from 6/18to 6/21 with a syncopal episode, and chest pain for which she received IV heparin, ruled out for ACS, evaluated by cardiology at that time, who presented to the emergency room following another episode in which she felt weak and fell hitting her head with possible loss of consciousness.    # Trop elevation due to demand ischemia # Coronary artery disease involving native coronary artery of native heart without angina pectoris Patient presented with a syncopal episode.  No acute changes on EKG, but troponin elevation of 733. Recent echo 08/14/2019 showed normal LVEF with no regional  wall motion abnormalities.  Cardiology consulted during this hospitalization, who ruled out ACS.  Pt was continued on home ASA, Plavix and statin.  Beta blocker not started due to hx of associated bradycardia.  Pt was discharged with a 14-day event monitor and will follow up with outpatient cardiology.  Syncope and collapse Weakness Patient fell with loss of consciousness and this was preceded with weakness.  Head and C-spine CT showied no acute injury.  Cardiology was consulted and recommended 14-day event monitor.  PT rec SNF rehab.  Acute kidney injury, resolved  superimposed on CKD stage IV(HCC) Creatinine 3.14 elevated above baseline of 2.04.  AKI likely due to dehydration since Cr improved with MIVF.  Cr steadily improved during hospitalization,  and was 1.84 on the day of discharge.  Essential hypertension Pt's home BP meds were held prior to arrival due to soft pressures.  BP became more elevated during this hospitalization, so home hydralazine was resumed.  COPD with chronic bronchitis ([place]) Not acutely exacerbated. Continued home inhalers with duo nebs as needed  # hx of Type 2 diabetes mellitus, not currently active # Hyperglycemia likely due to recent steroid use A1c 5.6 currently and 3 years ago.  Pt is not on any anti-glycemics.  BG mildly elevated likely due to recent steroid use.  Hypothyroidism Continued levothyroxine  Pain with Shingles, right T 4 dermatome Pt's main complaint during her hospitalization was pain from Shingles.  Scheduled tylenol didn't help, so pt started on gabapentin.  Due to somnolence, gabapentin was started as 300 mg nightly and 100 mg daily.  Continued home Valtrex.  Calamine lotion PRN.   Discharge Diagnoses:  Principal Problem:   NSTEMI (non-ST elevated myocardial infarction) [facility]) Active Problems:   Essential hypertension   Coronary artery disease involving native coronary artery of native heart without angina pectoris   Acute kidney injury superimposed on CKD ([place])   COPD with chronic bronchitis (HCC)   CKD (chronic kidney disease) stage 4, GFR 15-29 ml/min (HCC)   Type 2 diabetes mellitus with hyperglycemia ([place])   Hypothyroidism   Syncope and collapse    Discharge Instructions:  Allergies as of 08/23/2019      Reactions   Atenolol Other (See Comments)   Other reaction(s): Other (See Comments) Decreased heart rate Decreased heart rate   Codeine Shortness Of Breath   Rash, difficulty breathing, nausea.   Losartan  Other (See Comments)   Hyperkalemia   Morphine And Related Shortness Of Breath   Rash, difficulty breathing, nausea.   Nsaids Other (See Comments)   Other reaction(s): Unknown   Rofecoxib    Other reaction(s): Unknown   Sucralfate Other (See Comments)    Throat tightness   Sulfa Antibiotics    Other reaction(s): Unknown      Medication List    STOP taking these medications   ALPRAZolam 0.25 MG tablet Commonly known as: XANAX   budesonide 0.5 MG/2ML nebulizer solution Commonly known as: PULMICORT   doxazosin 8 MG tablet Commonly known as: CARDURA   furosemide 20 MG tablet Commonly known as: LASIX   patiromer 8.4 g packet Commonly known as: VELTASSA   predniSONE 20 MG tablet Commonly known as: DELTASONE     TAKE these medications   acetaminophen 500 MG tablet Commonly known as: TYLENOL Take 1 tablet (500 mg total) by mouth every 6 (six) hours as needed for mild pain or moderate pain. Over the counter   albuterol 108 (90 Base) MCG/ACT inhaler Commonly known as: VENTOLIN HFA Inhale 2 puffs into the lungs every 6 (six) hours as needed for wheezing or shortness of breath.   aspirin 81 MG EC tablet Take 1 tablet (81 mg total) by mouth daily. Swallow whole. Start taking on: August 24, 2019   calamine lotion Apply topically 3 (three) times daily. Apply to affected areas. Also available OTC.   cloNIDine 0.2 mg/24hr patch Commonly known as: CATAPRES - Dosed in mg/24 hr Place 1 patch (0.2 mg total) onto the skin once a week.   clopidogrel 75 MG tablet Commonly known as: PLAVIX Take 1 tablet (75 mg total) by mouth daily.   Diclofenac Sodium 1 % Crea Apply 1 application topically 3 (three) times daily as needed.   docusate sodium 100 MG capsule Commonly known as: COLACE Take 100 mg by mouth daily as needed for mild constipation. Takes daily   erythromycin ophthalmic ointment   gabapentin 300 MG capsule Commonly known as: NEURONTIN Take 1 capsule (300 mg total) by mouth at bedtime.   gabapentin 100 MG capsule Commonly known as: NEURONTIN Take 1 capsule (100 mg total) by mouth daily. Start taking on: August 24, 2019   hydrALAZINE 100 MG tablet Commonly known as: APRESOLINE Take 100 mg by mouth 3 (three) times  daily.   ipratropium-albuterol 0.5-2.5 (3) MG/3ML Soln Commonly known as: DUONEB Take 3 mLs by nebulization every 4 (four) hours as needed (shortness of breath/wheezing.).   levothyroxine 88 MCG tablet Commonly known as: SYNTHROID   montelukast 10 MG tablet Commonly known as: SINGULAIR Take 10 mg by mouth at bedtime.   nitroGLYCERIN 0.4 MG SL tablet Commonly known as: NITROSTAT Place 1 tablet (0.4 mg total) under the tongue every 5 (five) minutes x 3 doses as needed for chest pain.   oxybutynin 5 MG 24 hr tablet Commonly known as: DITROPAN-XL Take 5 mg by mouth daily.   pantoprazole 40 MG tablet Commonly known as: PROTONIX Take 20 mg by mouth daily.   simvastatin 40 MG tablet Commonly known as: ZOCOR Take 1 tablet (40 mg total) by mouth at bedtime.   sodium zirconium cyclosilicate 5 g packet Commonly known as: LOKELMA Take 5 g by mouth daily.   Symbicort 160-4.5 MCG/ACT inhaler Generic drug: budesonide-formoterol Inhale 2 puffs into the lungs 2 (two) times daily.   valACYclovir 1000 MG tablet Commonly known as: VALTREX Take 1,000 mg by mouth 2 (two) times daily.  Follow-up Information    [name], MD. Schedule an appointment as soon as possible for a visit in 1 week(s).   Specialty: Internal Medicine Contact information: [place] [address] [phone]        Minna Merritts, MD .   Specialty: Cardiology Contact information: [address] [place] [address] [phone]               Allergies  Allergen Reactions  . Atenolol Other (See Comments)    Other reaction(s): Other (See Comments) Decreased heart rate Decreased heart rate  . Codeine Shortness Of Breath    Rash, difficulty breathing, nausea.  . Losartan Other (See Comments)    Hyperkalemia  . Morphine And Related Shortness Of Breath    Rash, difficulty breathing, nausea.   . Nsaids Other (See Comments)    Other  reaction(s): Unknown  . Rofecoxib     Other reaction(s): Unknown  . Sucralfate Other (See Comments)    Throat tightness  . Sulfa Antibiotics     Other reaction(s): Unknown     The results of significant diagnostics from this hospitalization (including imaging, microbiology, ancillary and laboratory) are listed below for reference.   Consultations:   Procedures/Studies: DG Chest 2 View  Result Date: 08/06/2019 CLINICAL DATA:  Chest pain and shortness of breath EXAM: CHEST - 2 VIEW COMPARISON:  July 28, 2019 FINDINGS: There is no edema or airspace opacity. Heart is upper normal in size with pulmonary vascularity normal. No adenopathy. There is a moderate hiatal hernia, stable. There is aortic atherosclerosis. No pneumothorax. No bone lesions. IMPRESSION: No edema or airspace opacity. Stable cardiac silhouette. Hiatal hernia present. No evident adenopathy. Aortic Atherosclerosis (ICD10-I70.0). Electronically Signed   By: Lowella Grip III M.D.   On: 08/06/2019 10:06   DG Chest 2 View  Result Date: 07/28/2019 CLINICAL DATA:  Shortness of breath EXAM: CHEST - 2 VIEW COMPARISON:  01/29/2018 FINDINGS: The heart size and mediastinal contours are mildly enlarged, stable. Atherosclerotic calcification of the aortic knob. Moderate-sized hiatal hernia is again noted. No focal airspace consolidation, pleural effusion, or pneumothorax. The visualized skeletal structures are within normal limits. IMPRESSION: No active cardiopulmonary disease. Electronically Signed   By: Davina Poke D.O.   On: 07/28/2019 13:11   DG Ribs Unilateral W/Chest Right  Result Date: 08/18/2019 CLINICAL DATA:  86 year old female with fall. EXAM: RIGHT RIBS AND CHEST - 3+ VIEW COMPARISON:  Chest radiograph dated 08/06/2019. FINDINGS: No focal consolidation, pleural effusion, pneumothorax. Stable cardiomegaly. There is calcification of the mitral annulus. Atherosclerotic calcification of the aortic arch. Osteopenia with  degenerative changes of the spine. No acute osseous pathology. No displaced rib fractures. IMPRESSION: 1. No acute cardiopulmonary process. 2. No displaced rib fracture or pneumothorax. Electronically Signed   By: Anner Crete M.D.   On: 08/18/2019 17:50   CT Head Wo Contrast  Result Date: 08/18/2019 CLINICAL DATA:  Weakness and subsequent fall. EXAM: CT HEAD WITHOUT CONTRAST TECHNIQUE: Contiguous axial images were obtained from the base of the skull through the vertex without intravenous contrast. COMPARISON:  August 12, 2019 FINDINGS: Brain: There is mild cerebral atrophy with widening of the extra-axial spaces and ventricular dilatation. There are areas of decreased attenuation within the white matter tracts of the supratentorial brain, consistent with microvascular disease changes. Vascular: No hyperdense vessel or unexpected calcification. Skull: Normal. Negative for fracture or focal lesion. Sinuses/Orbits: No acute finding. Other: None. IMPRESSION: 1. Generalized cerebral atrophy. 2. No  acute intracranial abnormality. Electronically Signed   By: Virgina Norfolk M.D.   On: 08/18/2019 17:34   CT HEAD WO CONTRAST  Result Date: 08/12/2019 CLINICAL DATA:  Syncope. EXAM: CT HEAD WITHOUT CONTRAST TECHNIQUE: Contiguous axial images were obtained from the base of the skull through the vertex without intravenous contrast. COMPARISON:  September 21, 2016 FINDINGS: Brain: There is mild cerebral atrophy with widening of the extra-axial spaces and ventricular dilatation. There are areas of decreased attenuation within the white matter tracts of the supratentorial brain, consistent with microvascular disease changes. Vascular: No hyperdense vessel or unexpected calcification. Skull: Normal. Negative for fracture or focal lesion. Sinuses/Orbits: No acute finding. Other: None. IMPRESSION: 1. Generalized cerebral atrophy. 2. No acute intracranial abnormality. Electronically Signed   By: Virgina Norfolk M.D.   On:  08/12/2019 17:05   CT Chest Wo Contrast  Result Date: 08/06/2019 CLINICAL DATA:  Nonspecific chest pain EXAM: CT CHEST WITHOUT CONTRAST TECHNIQUE: Multidetector CT imaging of the chest was performed following the standard protocol without IV contrast. COMPARISON:  01/13/2017 FINDINGS: Cardiovascular: Normal heart size. No pericardial effusion. Extensive atherosclerotic calcification of the aorta, great vessels, and coronaries. Mediastinum/Nodes: Moderate sliding hiatal hernia.  No adenopathy. Lungs/Pleura: There is no edema, consolidation, effusion, or pneumothorax. Scattered pulmonary nodules measuring up to 5 mm in the left lower lobe 7 mm in the right upper lobe. These are stable from prior and considered benign. Mild subpleural atelectasis or scarring in the apical lungs. Upper Abdomen: Small calcification and left upper pole cystic density. Musculoskeletal: Extensive spondylosis with multi-level thoracic bridging. No evidence of fracture or erosion. IMPRESSION: 1. No acute finding. 2. Extensive atherosclerosis. 3. Scattered pulmonary nodules that are stable and benign. 4. Moderate hiatal hernia. Aortic Atherosclerosis (ICD10-I70.0). Electronically Signed   By: Monte Fantasia M.D.   On: 08/06/2019 13:41   CT Cervical Spine Wo Contrast  Result Date: 08/18/2019 CLINICAL DATA:  Status post fall. EXAM: CT CERVICAL SPINE WITHOUT CONTRAST TECHNIQUE: Multidetector CT imaging of the cervical spine was performed without intravenous contrast. Multiplanar CT image reconstructions were also generated. COMPARISON:  None. FINDINGS: Alignment: Normal. Skull base and vertebrae: No acute fracture. No primary bone lesion or focal pathologic process. Soft tissues and spinal canal: No prevertebral fluid or swelling. No visible canal hematoma. Disc levels: Marked severity endplate sclerosis is seen at the levels of C5-C6 and C6-C7. Moderate to marked severity intervertebral disc space narrowing is also seen at these levels.  Mild multilevel endplate sclerosis and mild intervertebral disc space narrowing is seen throughout the remainder of the cervical spine. There is moderate to marked severity bilateral multilevel facet joint hypertrophy. Upper chest: Moderate severity right apical scarring and/or atelectasis is seen. Pleural fluid is also seen along the right apex. Other: None. IMPRESSION: 1. No acute fracture within the cervical spine. 2. Marked severity degenerative changes at the levels of C5-C6 and C6-C7. 3. Moderate severity right apical scarring and/or atelectasis. 4. Right apical pleural fluid. Electronically Signed   By: Virgina Norfolk M.D.   On: 08/18/2019 17:40   US Venous Img Lower Bilateral (DVT)  Result Date: 08/06/2019 CLINICAL DATA:  Lower extremity swelling EXAM: BILATERAL LOWER EXTREMITY VENOUS DOPPLER ULTRASOUND TECHNIQUE: Gray-scale sonography with compression, as well as color and duplex ultrasound, were performed to evaluate the deep venous system(s) from the level of the common femoral vein through the popliteal and proximal calf veins. COMPARISON:  02/10/2019 right lower extremity venous Doppler scan FINDINGS: VENOUS Normal compressibility of the common femoral, superficial  femoral, and popliteal veins, as well as the visualized calf veins. Visualized portions of profunda femoral vein and great saphenous vein unremarkable. No filling defects to suggest DVT on grayscale or color Doppler imaging. Doppler waveforms show normal direction of venous flow, normal respiratory plasticity and response to augmentation. Limited views of the contralateral common femoral vein are unremarkable. OTHER None. Limitations: none IMPRESSION: No evidence of deep venous thrombosis in either lower extremity. Electronically Signed   By: Ilona Sorrel M.D.   On: 08/06/2019 16:23   ECHOCARDIOGRAM COMPLETE BUBBLE STUDY  Result Date: 08/14/2019    ECHOCARDIOGRAM REPORT   Patient Name:   Amy [name] Date of Exam: 08/14/2019  Medical Rec #:  [id number]          Height:       62.0 in Accession #:    [id number]         Weight:       167.3 lb Date of Birth:  1927-03-15          BSA:          1.772 m Patient Age:    71 years           BP:           193/48 mmHg Patient Gender: F                  HR:           71 bpm. Exam Location:  [facility] Procedure: 2D Echo and Saline Contrast Bubble Study Indications:     Syncope  History:         Patient has prior history of Echocardiogram examinations. CAD;                  Risk Factors:Hypertension, Dyslipidemia and Diabetes.  Sonographer:     L Thornton-Maynard Referring Phys:  [id number] ELFYBOFB K RAI Diagnosing Phys: Ida Rogue MD IMPRESSIONS  1. Left ventricular ejection fraction, by estimation, is 60 to 65%. The left ventricle has normal function. The left ventricle has no regional wall motion abnormalities. There is moderate to severe left ventricular hypertrophy. Left ventricular diastolic parameters are consistent with Grade I diastolic dysfunction (impaired relaxation).  2. Right ventricular systolic function is normal. The right ventricular size is normal. Tricuspid regurgitation signal is inadequate for assessing PA pressure.  3. Left atrial size was severely dilated  4. The aortic valve is normal in structure. Aortic valve regurgitation is not visualized. Mild aortic valve stenosis. Aortic valve mean gradient measures 17.0 mmHg. Aortic valve Vmax measures 2.74 m/s. FINDINGS  Left Ventricle: Left ventricular ejection fraction, by estimation, is 60 to 65%. The left ventricle has normal function. The left ventricle has no regional wall motion abnormalities. The left ventricular internal cavity size was normal in size. There is  moderate left ventricular hypertrophy. Left ventricular diastolic parameters are consistent with Grade I diastolic dysfunction (impaired relaxation). Right Ventricle: The right ventricular size is normal. No increase in right ventricular wall thickness. Right ventricular  systolic function is normal. Tricuspid regurgitation signal is inadequate for assessing PA pressure. Left Atrium: Left atrial size was severely dilated. Right Atrium: Right atrial size was normal in size. Pericardium: There is no evidence of pericardial effusion. Mitral Valve: The mitral valve is normal in structure. Normal mobility of the mitral valve leaflets. Severe mitral annular calcification. Mild mitral valve regurgitation. No evidence of mitral valve stenosis. MV peak gradient, 14.4 mmHg. The mean mitral valve gradient is 7.0 mmHg.  Tricuspid Valve: The tricuspid valve is normal in structure. Tricuspid valve regurgitation is not demonstrated. No evidence of tricuspid stenosis. Aortic Valve: The aortic valve is normal in structure. Aortic valve regurgitation is not visualized. Mild aortic stenosis is present. Aortic valve mean gradient measures 17.0 mmHg. Aortic valve peak gradient measures 30.0 mmHg. Aortic valve area, by VTI measures 1.13 cm. Pulmonic Valve: The pulmonic valve was normal in structure. Pulmonic valve regurgitation is not visualized. No evidence of pulmonic stenosis. Aorta: The aortic root is normal in size and structure. Venous: The inferior vena cava is normal in size with greater than 50% respiratory variability, suggesting right atrial pressure of 3 mmHg. IAS/Shunts: No atrial level shunt detected by color flow Doppler. Agitated saline contrast was given intravenously to evaluate for intracardiac shunting.  LEFT VENTRICLE PLAX 2D LVIDd:         2.96 cm  Diastology LVIDs:         2.13 cm  LV e' lateral:   3.48 cm/s LV PW:         1.63 cm  LV E/e' lateral: 38.8 LV IVS:        1.96 cm  LV e' medial:    4.68 cm/s LVOT diam:     1.70 cm  LV E/e' medial:  28.8 LV SV:         61 LV SV Index:   34 LVOT Area:     2.27 cm  RIGHT VENTRICLE RV S prime:     14.00 cm/s TAPSE (M-mode): 3.7 cm LEFT ATRIUM              Index LA diam:        3.60 cm  2.03 cm/m LA Vol (A2C):   75.3 ml  42.49 ml/m LA Vol  (A4C):   113.0 ml 63.77 ml/m LA Biplane Vol: 95.6 ml  53.95 ml/m  AORTIC VALVE                    PULMONIC VALVE AV Area (Vmax):    1.06 cm     PV Vmax:       1.58 m/s AV Area (Vmean):   1.15 cm     PV Vmean:      125.000 cm/s AV Area (VTI):     1.13 cm     PV VTI:        0.356 m AV Vmax:           274.00 cm/s  PV Peak grad:  10.0 mmHg AV Vmean:          190.000 cm/s PV Mean grad:  7.0 mmHg AV VTI:            0.538 m AV Peak Grad:      30.0 mmHg AV Mean Grad:      17.0 mmHg LVOT Vmax:         128.00 cm/s LVOT Vmean:        96.300 cm/s LVOT VTI:          0.268 m LVOT/AV VTI ratio: 0.50  AORTA Ao Root diam: 2.90 cm MITRAL VALVE MV Area (PHT): 1.76 cm     SHUNTS MV Peak grad:  14.4 mmHg    Systemic VTI:  0.27 m MV Mean grad:  7.0 mmHg     Systemic Diam: 1.70 cm MV Vmax:       1.90 m/s MV Vmean:      126.0 cm/s MV Decel Time: 430 msec MV E  velocity: 135.00 cm/s MV A velocity: 183.00 cm/s MV E/A ratio:  0.74 Ida Rogue MD Electronically signed by Ida Rogue MD Signature Date/Time: 08/14/2019/2:22:07 PM    Final    DG HIP UNILAT WITH PELVIS 2-3 VIEWS RIGHT  Result Date: 08/12/2019 CLINICAL DATA:  Right groin pain EXAM: DG HIP (WITH OR WITHOUT PELVIS) 2-3V RIGHT COMPARISON:  None. FINDINGS: Degenerative changes of the right hip joint are noted. Pelvic ring is intact. No acute fracture or dislocation is noted. IMPRESSION: Significant degenerative change of the right hip. Electronically Signed   By: Inez Catalina M.D.   On: 08/12/2019 16:49      Labs: BNP (last 3 results) Recent Labs    08/12/19 1300  BNP 97.0   Basic Metabolic Panel: Recent Labs  Lab 08/19/19 0641 08/20/19 0449 08/21/19 0407 08/22/19 0616 08/23/19 0615  NA 137 137 137 139 138  K 4.3 4.9 4.8 4.8 5.3*  CL 112* 112* 112* 113* 113*  CO2 19* 19* 18* 20* 20*  GLUCOSE 127* 111* 105* 116* 111*  BUN 100* 93* 78* 70* 58*  CREATININE 2.82* 2.31* 2.10* 1.87* 1.84*  CALCIUM 8.3* 8.3* 8.4* 8.4* 8.4*  MG  --  2.0 1.8 1.8 1.7    Liver Function Tests: No results for input(s): AST, ALT, ALKPHOS, BILITOT, PROT, ALBUMIN in the last 168 hours. No results for input(s): LIPASE, AMYLASE in the last 168 hours. No results for input(s): AMMONIA in the last 168 hours. CBC: Recent Labs  Lab 08/19/19 0641 08/20/19 0735 08/21/19 0407 08/22/19 0616 08/23/19 0615  WBC 11.7* 9.2 8.6 7.2 6.4  HGB 8.4* 9.0* 8.9* 8.5* 8.2*  HCT 25.0* 27.1* 26.3* 24.5* 24.3*  MCV 93.6 94.8 93.6 93.5 94.6  PLT 210 213 216 189 186   Cardiac Enzymes: No results for input(s): CKTOTAL, CKMB, CKMBINDEX, TROPONINI in the last 168 hours. BNP: Invalid input(s): POCBNP CBG: Recent Labs  Lab 08/22/19 1126 08/22/19 1634 08/22/19 2125 08/23/19 0748 08/23/19 1220  GLUCAP 141* 127* 136* 106* 92   D-Dimer No results for input(s): DDIMER in the last 72 hours. Hgb A1c No results for input(s): HGBA1C in the last 72 hours. Lipid Profile No results for input(s): CHOL, HDL, LDLCALC, TRIG, CHOLHDL, LDLDIRECT in the last 72 hours. Thyroid function studies No results for input(s): TSH, T4TOTAL, T3FREE, THYROIDAB in the last 72 hours.  Invalid input(s): FREET3 Anemia work up No results for input(s): VITAMINB12, FOLATE, FERRITIN, TIBC, IRON, RETICCTPCT in the last 72 hours. Urinalysis    Component Value Date/Time   COLORURINE YELLOW (A) 08/18/2019 0745   APPEARANCEUR CLEAR (A) 08/18/2019 0745   APPEARANCEUR Cloudy (A) 03/15/2019 0909   LABSPEC 1.014 08/18/2019 0745   LABSPEC 1.025 10/11/2013 1637   PHURINE 5.0 08/18/2019 0745   GLUCOSEU NEGATIVE 08/18/2019 0745   GLUCOSEU Negative 10/11/2013 1637   HGBUR NEGATIVE 08/18/2019 0745   BILIRUBINUR NEGATIVE 08/18/2019 0745   BILIRUBINUR Negative 03/15/2019 0909   BILIRUBINUR Negative 10/11/2013 1637   KETONESUR NEGATIVE 08/18/2019 0745   PROTEINUR NEGATIVE 08/18/2019 0745   NITRITE NEGATIVE 08/18/2019 0745   LEUKOCYTESUR NEGATIVE 08/18/2019 0745   LEUKOCYTESUR Negative 10/11/2013 1637   Sepsis  Labs Invalid input(s): PROCALCITONIN,  WBC,  LACTICIDVEN Microbiology Recent Results (from the past 240 hour(s))  Urine Culture     Status: Abnormal   Collection Time: 08/13/19  6:30 PM   Specimen: Urine, Random  Result Value Ref Range Status   Specimen Description   Final    URINE, RANDOM Performed at [facility]  [facility], [address], [place], [place] [address]    Special Requests   Final    NONE Performed at [facility], [address] [place], [place] [address]    Culture (A)  Final    70,000 COLONIES/mL MULTIPLE SPECIES PRESENT, SUGGEST RECOLLECTION   Report Status 08/15/2019 FINAL  Final  SARS Coronavirus 2 by RT PCR (hospital order, performed in [facility] hospital lab) Nasopharyngeal Nasopharyngeal Swab     Status: None   Collection Time: 08/18/19  7:08 PM   Specimen: Nasopharyngeal Swab  Result Value Ref Range Status   SARS Coronavirus 2 NEGATIVE NEGATIVE Final    Comment: (NOTE) SARS-CoV-2 target nucleic acids are NOT DETECTED.  The SARS-CoV-2 RNA is generally detectable in upper and lower respiratory specimens during the acute phase of infection. The lowest concentration of SARS-CoV-2 viral copies this assay can detect is 250 copies / mL. A negative result does not preclude SARS-CoV-2 infection and should not be used as the sole basis for treatment or other patient management decisions.  A negative result may occur with improper specimen collection / handling, submission of specimen other than nasopharyngeal swab, presence of viral mutation(s) within the areas targeted by this assay, and inadequate number of viral copies (<250 copies / mL). A negative result must be combined with clinical observations, patient history, and epidemiological information.  Fact Sheet for Patients:   StrictlyIdeas.no  Fact Sheet for Healthcare Providers: BankingDealers.co.za  This test is not yet approved or  cleared by  the [place] FDA and has been authorized for detection and/or diagnosis of SARS-CoV-2 by FDA under an Emergency Use Authorization (EUA).  This EUA will remain in effect (meaning this test can be used) for the duration of the COVID-19 declaration under Section 564(b)(1) of the Act, 21 U.S.C. section 360bbb-3(b)(1), unless the authorization is terminated or revoked sooner.  Performed at [facility], [address] [place], [place] [address]      Total time spend on discharging this patient, including the last patient exam, discussing the hospital stay, instructions for ongoing care as it relates to all pertinent caregivers, as well as preparing the medical discharge records, prescriptions, and/or referrals as applicable, is 30 minutes.    Enzo Bi, MD  [facility] 08/23/2019, 1:47 PM  If 7PM-7AM, please contact night-coverage

## 2019-08-23 NOTE — TOC Transition Note (Signed)
Transition of Care [facility]) - CM/SW Discharge Note   Patient Details  Name: Amy [name] MRN: [id number] Date of Birth: [birth year]  Transition of Care [facility]) CM/SW Contact:  Eileen Stanford, LCSW Phone Number: 08/23/2019, 2:15 PM   Clinical Narrative:   Clinical Social Worker facilitated patient discharge including contacting patient family and facility to confirm patient discharge plans.  Clinical information faxed to facility and family agreeable with plan.  CSW arranged ambulance transport via [facility] to [facility] (room 209) .  RN to call [phone] for report prior to discharge.    Final next level of care: Skilled Nursing Facility Barriers to Discharge: No Barriers Identified   Patient Goals and CMS Choice Patient states their goals for this hospitalization and ongoing recovery are:: Just want to get stronger   Choice offered to / list presented to : Patient  Discharge Placement              Patient [facility] bed at: [facility] Patient to be transferred to facility by: [facility] Name of family member notified: pt's daughter Patient and family notified of of transfer: 08/23/19  Discharge Plan and Services In-house Referral: Clinical Social Work Discharge Planning Services: CM Consult Post Acute Care Choice: [place]                               Social Determinants of Health (SDOH) Interventions     Readmission Risk Interventions Readmission Risk Prevention Plan 08/23/2019 08/15/2019  Transportation Screening Complete Complete  Medication Review Press photographer) Complete Complete  PCP or Specialist appointment within 3-5 days of discharge Complete Complete  HRI or Home Care Consult Complete Complete  SW Recovery Care/Counseling Consult Complete Complete  Palliative Care Screening Not Applicable Not Applicable  Skilled Nursing Facility Complete Not Applicable  Some recent data might be hidden

## 2019-08-24 ENCOUNTER — Ambulatory Visit: Payer: Medicare Other

## 2019-08-25 DIAGNOSIS — I5032 Chronic diastolic (congestive) heart failure: Secondary | ICD-10-CM | POA: Diagnosis not present

## 2019-08-25 DIAGNOSIS — J439 Emphysema, unspecified: Secondary | ICD-10-CM

## 2019-08-25 DIAGNOSIS — I25119 Atherosclerotic heart disease of native coronary artery with unspecified angina pectoris: Secondary | ICD-10-CM

## 2019-08-25 DIAGNOSIS — N184 Chronic kidney disease, stage 4 (severe): Secondary | ICD-10-CM | POA: Diagnosis not present

## 2019-08-25 DIAGNOSIS — B0223 Postherpetic polyneuropathy: Secondary | ICD-10-CM | POA: Diagnosis not present

## 2019-08-25 DIAGNOSIS — K219 Gastro-esophageal reflux disease without esophagitis: Secondary | ICD-10-CM

## 2019-08-26 ENCOUNTER — Inpatient Hospital Stay: Payer: Medicare Other

## 2019-08-26 ENCOUNTER — Telehealth: Payer: Self-pay

## 2019-08-26 ENCOUNTER — Inpatient Hospital Stay: Payer: Medicare Other | Admitting: Oncology

## 2019-08-26 NOTE — Telephone Encounter (Signed)
Telephone call to schedule palliative care visit.  Patient did not answer phone, RN called and spoke with patients daughter [name].  Daughter reports patient is currently at [facility] for rehab.  Patient has had 3 hospitalizations in June.  RN emailed palliative care administrative team to request they contact [facility] for order for patient to be seen while at [facility].  Daughter in agreement with hospice evaluation when patient returns home.

## 2019-08-30 DIAGNOSIS — J181 Lobar pneumonia, unspecified organism: Secondary | ICD-10-CM

## 2019-08-30 DIAGNOSIS — C3432 Malignant neoplasm of lower lobe, left bronchus or lung: Secondary | ICD-10-CM

## 2019-09-01 ENCOUNTER — Telehealth: Payer: Self-pay

## 2019-09-01 NOTE — Telephone Encounter (Signed)
Telephone call to daughter [name] to schedule palliative care visit.  [name] in agreement with palliative team care making facility visit on 09/05/19 at 12:30 PM.

## 2019-09-02 DIAGNOSIS — M542 Cervicalgia: Secondary | ICD-10-CM | POA: Diagnosis not present

## 2019-09-02 DIAGNOSIS — M545 Low back pain: Secondary | ICD-10-CM | POA: Diagnosis not present

## 2019-09-02 DIAGNOSIS — Y92129 Unspecified place in nursing home as the place of occurrence of the external cause: Secondary | ICD-10-CM | POA: Diagnosis not present

## 2019-09-02 DIAGNOSIS — S0003XA Contusion of scalp, initial encounter: Secondary | ICD-10-CM | POA: Diagnosis not present

## 2019-09-05 ENCOUNTER — Other Ambulatory Visit: Payer: Medicare Other

## 2019-09-05 ENCOUNTER — Non-Acute Institutional Stay: Payer: Medicare Other

## 2019-09-05 ENCOUNTER — Other Ambulatory Visit: Payer: Self-pay

## 2019-09-05 VITALS — BP 170/48 | HR 72 | Temp 98.4°F | Resp 18 | Wt 181.2 lb

## 2019-09-05 DIAGNOSIS — R2233 Localized swelling, mass and lump, upper limb, bilateral: Secondary | ICD-10-CM

## 2019-09-05 DIAGNOSIS — Z515 Encounter for palliative care: Secondary | ICD-10-CM

## 2019-09-05 NOTE — Progress Notes (Signed)
PATIENT NAME: Amy [name] DOB: Aug 23, 1927 MRN: [id number]  PRIMARY CARE PROVIDER: [name], MD  RESPONSIBLE PARTY:  Acct ID - Guarantor Home Phone Work Phone Relationship Acct Type  [id number] Amy [name],* [phone]  Self P/F     [name], [name], [name] [address]    PLAN OF CARE and INTERVENTIONS:               1.  GOALS OF CARE/ ADVANCE CARE PLANNING:  Patient would like to get stronger and be able to return to her home.                2.  PATIENT/CAREGIVER EDUCATION:  Education on fall precautions, education on s/s of infection, reviewed meds, support               4. PERSONAL EMERGENCY PLAN:  Patient currently at [name] for rehab.                 5.  DISEASE STATUS: RN made scheduled palliative care facility visit. Nurse met with patient and patients son who was visiting patient. Patient received lunch tray during visit. Patient only ate a few bites of her lunch but did eat her dessert. Son reports patient is possibly eating 50% of meals or less. Patient had 3 hospitalizations during the month of June. Patient suffered a fall, developed pneumonia and had shingles outbreak. Patient currently at [name] for Rehab. Patient reports she is feeling better. Patient was able to walk with PT  Assist.  Patient continues to have a lot of pain on right upper side of body from shingles outbreak. Nurse Amy [name] at facility reports Dr Amy [name] increased patients Gabapentin to 352m in the AM and at 2 PM and patient to take 400 mg for the next two days at bedtime. Patient has a skin tear on right lower forearm. Facility staff has dressing applied to area and dressing has no drainage. Patient states she is hopeful that she can return home and feels she will be stronger by next week. Patients weight on July 7th was 181.2 pounds. Patient reports her breathing has been a little better while being at facility. Patient denies having any cough. Patient shingles lesions have healed.  Patient has device on chest that is monitoring patient's heart for irregularities. Patient states she has had to wear device for 2 weeks. Patient has 2+ edema in her lower extremities left extremity greater than the right. Patient reports she has been sleeping well at night. Patients family remains supportive. Nurse discussed Hospice Services with patient.  Patient requests RN follow up with her regarding hospice services when she returns home.  Nurse reviewed patient's medications with staff nurse JJanett [name] Patient and family remain in agreement with palliative care services.  Patient, family and facility staff encouraged to contact palliative care with questions or concerns.     HISTORY OF PRESENT ILLNESS:  Patient is a 86year old female [facility] Patient is followed by palliative care.  Patient is seen monthly and PRN   CODE STATUS:  DNR   ADVANCED DIRECTIVES: Y MOST FORM: Yes PPS: 40%   PHYSICAL EXAM:   VITALS: Today's Vitals   09/05/19 1300  BP: (!) 170/48  Pulse: 72  Resp: 18  Temp: 98.4 F (36.9 C)  TempSrc: Temporal  SpO2: 98%  Weight: 181 lb 3.2 oz (82.2 kg)  PainSc: 4   PainLoc: Shoulder    LUNGS: clear to auscultation  CARDIAC: Cor RRR  EXTREMITIES: 2+ edema SKIN: skin tear to right lower forearm.  Area is wrapped, no drainage on dressing.  Facility staff providing wound care.   NEURO: positive for gait problems, memory problems and weakness       Amy Simmer, RN

## 2019-09-07 ENCOUNTER — Ambulatory Visit
Admission: RE | Admit: 2019-09-07 | Discharge: 2019-09-07 | Disposition: A | Discharge status: Home / Self Care | Payer: No Typology Code available for payment source | Source: Ambulatory Visit | Attending: Student | Admitting: Student

## 2019-09-07 ENCOUNTER — Other Ambulatory Visit: Payer: Self-pay

## 2019-09-07 DIAGNOSIS — N184 Chronic kidney disease, stage 4 (severe): Secondary | ICD-10-CM | POA: Diagnosis not present

## 2019-09-25 NOTE — Progress Notes (Signed)
[place]  Telephone:([phone]) [phone] Fax:(336) [phone]  ID: Amy [name] OB: September 22, 1927  MR#: [id number]  AOZ#:[id number]  Patient Care Team: [name], MD as PCP - General (Internal Medicine) Rockey Situ Kathlene November, MD as PCP - Cardiology (Cardiology) Minna Merritts, MD as Consulting Physician (Cardiology)  CHIEF COMPLAINT: Anemia secondary to chronic renal insufficiency.  INTERVAL HISTORY: Patient returns to clinic today for repeat laboratory work, further evaluation, and continuation of Retacrit.  She has significant pain in her right upper extremity secondary to recent bout of shingles.  She otherwise feels well.  She continues to have chronic weakness and fatigue.  She has no neurologic complaints.  She denies any chest pain, shortness of breath, cough, or hemoptysis.  She denies any nausea, vomiting, constipation, or diarrhea.  She has no urinary complaints.  Patient feels generally terrible, but offers no further specific complaints.  REVIEW OF SYSTEMS:   Review of Systems  Constitutional: Positive for malaise/fatigue. Negative for fever and weight loss.  HENT: Negative for congestion and sore throat.   Respiratory: Negative.  Negative for cough, hemoptysis, shortness of breath and stridor.   Cardiovascular: Negative.  Negative for chest pain and leg swelling.  Gastrointestinal: Negative for abdominal pain, blood in stool and melena.  Genitourinary: Negative.  Negative for hematuria.  Musculoskeletal: Positive for joint pain and myalgias. Negative for back pain.  Skin: Negative.  Negative for rash.  Neurological: Positive for weakness. Negative for sensory change, focal weakness and headaches.  Psychiatric/Behavioral: Negative.  The patient is not nervous/anxious.     As per HPI. Otherwise, a complete review of systems is negative.  PAST MEDICAL HISTORY: Past Medical History:  Diagnosis Date  . Bladder incontinence   . CAD (coronary artery  disease)    a. 05/2009 Cath: LAD 90p (Xience 2.75 x 12 mm DES), 59m, D1 40, LCx 40p/m, RCA 30/40/30p/m, RCA 30/25d;  b.  12/2011 Lexiscan MV: no ischemia, breast attenuation artifact, normal EF-->Low risk; c. 10/2016 MV: fixed apical defect, most likely apical thinning and attenuation, No ischemia, EF 60%.  . Cancer ([place])    ovarian  . Carotid arterial disease w/ R Carotid Bruit (HCC)    a. 08/2016 Carotid U/S: 40-59% bilat ICA stenosis - f/u 1 yr.  . Chronic diastolic (congestive) heart failure ([place])    a. Echo 09/2015: EF 55-60% w/ Grade 1 DD, sev Ca2+ MV annulus, mildly dil LA; b. 09/2016 Echo: EF 65-70%, Gr2 DD, mildly dil LA/RA, nl RV fxn.  . Chronic Dyspnea on exertion   . CKD (chronic kidney disease) stage 3, GFR 30-59 ml/min   . Degenerative arthritis of knee    bilateral knees  . Diabetes mellitus    Type II  . GIB (gastrointestinal bleeding)    a. 08/2016 Admission w/ presyncope/anemia/melena-->req Transfusion-->endo ok, colonoscopy w/ polyps but no source of bleeding (most likely diverticular).  . Hiatal hernia   . Hypertension   . Iron deficiency   . Menopausal symptoms   . Morbid obesity ([place])   . Renal insufficiency   . Thyroid disease    hypothyroidism    PAST SURGICAL HISTORY: Past Surgical History:  Procedure Laterality Date  . COLONOSCOPY  2015  . COLONOSCOPY WITH PROPOFOL N/A 09/25/2016   Procedure: COLONOSCOPY WITH PROPOFOL;  Surgeon: [name], MD;  Location: [facility] ENDOSCOPY;  Service: Gastroenterology;  Laterality: N/A;  . COLONOSCOPY WITH PROPOFOL N/A 09/26/2016   Procedure: COLONOSCOPY WITH PROPOFOL;  Surgeon: [name], MD;  Location: [place];  Service: Gastroenterology;  Laterality: N/A;  . ESOPHAGOGASTRODUODENOSCOPY (EGD) WITH PROPOFOL N/A 09/25/2016   Procedure: ESOPHAGOGASTRODUODENOSCOPY (EGD) WITH PROPOFOL;  Surgeon: [name], MD;  Location: [facility] ENDOSCOPY;  Service: Gastroenterology;  Laterality: N/A;  . RENAL ANGIOGRAPHY N/A 02/07/2019    Procedure: RENAL ANGIOGRAPHY;  Surgeon: [name], MD;  Location: [facility] CV LAB;  Service: Cardiovascular;  Laterality: N/A;  . REPLACEMENT TOTAL KNEE BILATERAL    . TOTAL VAGINAL HYSTERECTOMY     ovarian mass, not cancerous  . UPPER GI ENDOSCOPY  2015    FAMILY HISTORY Family History  Problem Relation Age of Onset  . Breast cancer Mother 21       ADVANCED DIRECTIVES:    HEALTH MAINTENANCE: Social History   Tobacco Use  . Smoking status: Never Smoker  . Smokeless tobacco: Never Used  Vaping Use  . Vaping Use: Never used  Substance Use Topics  . Alcohol use: No  . Drug use: No     Colonoscopy:  PAP:  Bone density:  Lipid panel:  Allergies  Allergen Reactions  . Atenolol Other (See Comments)    Other reaction(s): Other (See Comments) Decreased heart rate Decreased heart rate  . Codeine Shortness Of Breath    Rash, difficulty breathing, nausea.  . Losartan Other (See Comments)    Hyperkalemia  . Morphine And Related Shortness Of Breath    Rash, difficulty breathing, nausea.   . Nsaids Other (See Comments)    Other reaction(s): Unknown  . Rofecoxib     Other reaction(s): Unknown  . Sucralfate Other (See Comments)    Throat tightness  . Sulfa Antibiotics     Other reaction(s): Unknown    Current Outpatient Medications  Medication Sig Dispense Refill  . acetaminophen (TYLENOL) 500 MG tablet Take 1 tablet (500 mg total) by mouth every 6 (six) hours as needed for mild pain or moderate pain. Over the counter 30 tablet 0  . albuterol (PROVENTIL HFA;VENTOLIN HFA) 108 (90 Base) MCG/ACT inhaler Inhale 2 puffs into the lungs every 6 (six) hours as needed for wheezing or shortness of breath.    Marland Kitchen aspirin EC 81 MG EC tablet Take 1 tablet (81 mg total) by mouth daily. Swallow whole. 30 tablet 11  . calamine lotion Apply topically 3 (three) times daily. Apply to affected areas. Also available OTC. 180 mL 1  . cloNIDine (CATAPRES - DOSED IN MG/24 HR) 0.2 mg/24hr  patch Place 1 patch (0.2 mg total) onto the skin once a week. 4 patch 12  . clopidogrel (PLAVIX) 75 MG tablet Take 1 tablet (75 mg total) by mouth daily. 90 tablet 3  . docusate sodium (COLACE) 100 MG capsule Take 100 mg by mouth daily as needed for mild constipation. Takes daily    . erythromycin ophthalmic ointment     . gabapentin (NEURONTIN) 400 MG capsule Take 400 mg by mouth 4 (four) times daily.    Marland Kitchen ipratropium-albuterol (DUONEB) 0.5-2.5 (3) MG/3ML SOLN Take 3 mLs by nebulization every 4 (four) hours as needed (shortness of breath/wheezing.). 360 mL   . levothyroxine (SYNTHROID) 88 MCG tablet     . montelukast (SINGULAIR) 10 MG tablet Take 10 mg by mouth at bedtime.     . nitroGLYCERIN (NITROSTAT) 0.4 MG SL tablet Place 1 tablet (0.4 mg total) under the tongue every 5 (five) minutes x 3 doses as needed for chest pain.  12  . oxybutynin (DITROPAN-XL) 5 MG 24 hr tablet Take 5 mg by mouth daily.     Marland Kitchen  pantoprazole (PROTONIX) 40 MG tablet Take 20 mg by mouth daily.     . simvastatin (ZOCOR) 40 MG tablet Take 1 tablet (40 mg total) by mouth at bedtime. 90 tablet 2  . sodium zirconium cyclosilicate (LOKELMA) 5 g packet Take 5 g by mouth daily.    . SYMBICORT 160-4.5 MCG/ACT inhaler Inhale 2 puffs into the lungs 2 (two) times daily.     Marland Kitchen torsemide (DEMADEX) 20 MG tablet Take 20 mg by mouth 3 (three) times a week.    . traMADol (ULTRAM) 50 MG tablet Take 50-100 mg by mouth every 4 (four) hours as needed.    . hydrALAZINE (APRESOLINE) 100 MG tablet Take 100 mg by mouth 3 (three) times daily. (Patient not taking: Reported on 09/27/2019)    . HYDROcodone-acetaminophen (NORCO/VICODIN) 5-325 MG tablet Take 1 tablet by mouth every 6 (six) hours as needed for moderate pain. 30 tablet 0  . valACYclovir (VALTREX) 1000 MG tablet Take 1,000 mg by mouth 2 (two) times daily. (Patient not taking: Reported on 09/27/2019)     No current facility-administered medications for this visit.   Facility-Administered  Medications Ordered in Other Visits  Medication Dose Route Frequency Provider Last Rate Last Admin  . 0.9 %  sodium chloride infusion   Intravenous Continuous Lloyd Huger, MD      . epoetin alfa-epbx (RETACRIT) injection 40,000 Units  40,000 Units Subcutaneous Once Lloyd Huger, MD        OBJECTIVE: Vitals:   09/27/19 1017  BP: (!) 153/48  Pulse: 63  Temp: 99.1 F (37.3 C)  SpO2: 100%     Body mass index is 31.15 kg/m.    ECOG FS:2 - Symptomatic, <50% confined to bed  General: Well-developed, well-nourished, moderate distress secondary to pain.  Sitting in a wheelchair. Eyes: Pink conjunctiva, anicteric sclera. HEENT: Normocephalic, moist mucous membranes. Lungs: No audible wheezing or coughing. Heart: Regular rate and rhythm. Abdomen: Soft, nontender, no obvious distention. Musculoskeletal: No edema, cyanosis, or clubbing. Neuro: Alert, answering all questions appropriately. Cranial nerves grossly intact. Skin: No rashes or petechiae noted. Psych: Normal affect.   LAB RESULTS:  Lab Results  Component Value Date   NA 138 08/23/2019   K 5.3 (H) 08/23/2019   CL 113 (H) 08/23/2019   CO2 20 (L) 08/23/2019   GLUCOSE 111 (H) 08/23/2019   BUN 58 (H) 08/23/2019   CREATININE 1.84 (H) 08/23/2019   CALCIUM 8.4 (L) 08/23/2019   PROT 5.2 (L) 08/12/2019   ALBUMIN 2.9 (L) 08/12/2019   AST 14 (L) 08/12/2019   ALT 11 08/12/2019   ALKPHOS 58 08/12/2019   BILITOT 0.7 08/12/2019   GFRNONAA 24 (L) 08/23/2019   GFRAA 27 (L) 08/23/2019    Lab Results  Component Value Date   WBC 9.6 09/27/2019   NEUTROABS 5.9 09/27/2019   HGB 8.8 (L) 09/27/2019   HCT 27.7 (L) 09/27/2019   MCV 104.1 (H) 09/27/2019   PLT 269 09/27/2019   Lab Results  Component Value Date   IRON 41 09/27/2019   TIBC 146 (L) 09/27/2019   IRONPCTSAT 28 09/27/2019    Lab Results  Component Value Date   FERRITIN 308 (H) 09/27/2019    STUDIES: US RENAL  Result Date: 09/07/2019 CLINICAL DATA:   Chronic kidney disease EXAM: RENAL / URINARY TRACT ULTRASOUND COMPLETE COMPARISON:  12/10/2018 FINDINGS: Right Kidney: Renal measurements: 9.5 x 5.5 x 5.9 cm = volume: 160 mL. Renal cortical thinning with increased cortical echogenicity. Cyst with thin internal septation  is again noted at the upper pole measuring approximately 2.2 x 1.3 x 1.5 cm. No shadowing stone or hydronephrosis. Left Kidney: Renal measurements: 10.0 x 5.5 x 4.7 cm = volume: 134 mL. Renal cortical thinning with increased cortical echogenicity. Echogenic shadowing focus at the upper pole suggestive of a nonobstructing stone. No hydronephrosis. Rounded hypoechoic structure within the medial aspect of the mid to upper pole measuring 2.6 cm is suboptimally evaluated secondary to poor penetration although most likely represents a cyst. Bladder: Not well visualized. Other: None. IMPRESSION: 1. Right greater than left renal cortical atrophy with increased parenchymal echogenicity suggesting chronic medical renal disease. 2. Nonobstructing calculus within the left kidney. No evidence of obstructive uropathy. 3. Probable bilateral renal cysts, suboptimally evaluated secondary to poor penetration and difficulties with patient positioning. Electronically Signed   By: Davina Poke D.O.   On: 09/07/2019 17:05    ASSESSMENT: Anemia secondary to chronic renal insufficiency.  PLAN:    1.  Anemia secondary to chronic renal insufficiency: Patient's iron stores are now within normal limits.  Her hemoglobin remains decreased at 8.8, but continues to trend up. She does not require additional IV iron today.  She last received treatment with Feraheme on June 22, 2019.  Proceed with 40,000 units Retacrit today.  Return to clinic in 4 weeks with repeat laboratory work, further evaluation, and continuation of treatment if needed. 2. Pulmonary nodules: Repeat CT scan from January 13, 2017 revealed stable pulmonary nodules.  No further imaging is necessary.     3. Renal insufficiency: Chronic and unchanged.  Continue follow-up with nephrology as scheduled. 4.  Hypertension: Chronic and unchanged.  Proceed with Retacrit as scheduled.  Continue follow-up with primary care. 5.  History of GI bleed: Colonoscopy and EGD in August 2018.  Continue follow-up with GI as indicated. 6.  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  Patient received treatment with antivirals approximately 2 months ago and rash is essentially resolved.  She has been instructed to continue gabapentin and was given a prescription for Vicodin today.   Patient expressed understanding and was in agreement with this plan. She also understands that She can call clinic at any time with any questions, concerns, or complaints.    Lloyd Huger, MD   09/27/2019 6:02 PM

## 2019-09-26 ENCOUNTER — Other Ambulatory Visit: Payer: Self-pay

## 2019-09-26 ENCOUNTER — Other Ambulatory Visit: Payer: Medicare Other

## 2019-09-26 DIAGNOSIS — Z515 Encounter for palliative care: Secondary | ICD-10-CM

## 2019-09-26 NOTE — Progress Notes (Signed)
COMMUNITY PALLIATIVE CARE SW NOTE  PATIENT NAME: Amy [name] DOB: 1927-03-09 MRN: [id number]  PRIMARY CARE PROVIDER: [name], MD  RESPONSIBLE PARTY:  Acct ID - Guarantor Home Phone Work Phone Relationship Acct Type  [id number] Amy [name],* [phone]  Self P/F     [name], [place], [place] [address]     PLAN OF CARE and INTERVENTIONS:             1. GOALS OF CARE/ ADVANCE CARE PLANNING:  Patient is a DNR. MOST form completed as well. Patient goal is to remain in her home as independent as possible.  2. SOCIAL/EMOTIONAL/SPIRITUAL ASSESSMENT/ INTERVENTIONS:  SW conducted virtual telephonic visit with patient, as she stated that she dd not feel up to a in person visit on this day. Patient shared that she had recently hospitalization due to weakness from Elevated troponin and a fall that resulted in her going to [facility] for rehab for 20 days. Patient recently discharged back to home last week. Patient shared that she feels a bit a better but still a bit weak.Patient denies any current pain. Patient denies shortness of breath. Patient denies any recent falls since being home. Patient states that her appetite is coming back slowly. Patient is open to discussing Hospice but at a later tim. SW discussed goals, reviewed care plan, provided emotional support, used active and reflective listening. 3. PATIENT/CAREGIVER EDUCATION/ COPING: Patient was alert, engaged with SW. Patient was pleasant. Patient is open with expressing feelings. Patient enjoys reading the books from [employer], and enjoys time outdoors with family. Family is supportive, daughter brings patient's groceries and runs errands. Patient's son helps clean her home 4. PERSONAL EMERGENCY PLAN:  Patient has [place] and family will call 9-1-1 for emergencies 5. COMMUNITY RESOURCES COORDINATION/ HEALTH CARE NAVIGATION:  Patient coordinates her care. Daughter assists with transportation to appointments. Patient  believes [place] therapy has been set up for her. 6. FINANCIAL/LEGAL CONCERNS/INTERVENTIONS:  None     SOCIAL HX:  Social History   Tobacco Use  . Smoking status: Never Smoker  . Smokeless tobacco: Never Used  Substance Use Topics  . Alcohol use: No    CODE STATUS: DNR ADVANCED DIRECTIVES: Y MOST FORM COMPLETE:  Y HOSPICE EDUCATION PROVIDED: Y  PPS: Patient is independent with most ADL's. Family assist with meals. Patient has RW and WC.    Time Spent: 30 mins    [place], [place]

## 2019-09-27 ENCOUNTER — Inpatient Hospital Stay: Payer: Medicare Other

## 2019-09-27 ENCOUNTER — Inpatient Hospital Stay: Payer: Medicare Other | Attending: Oncology | Admitting: Oncology

## 2019-09-27 ENCOUNTER — Other Ambulatory Visit: Payer: Self-pay

## 2019-09-27 ENCOUNTER — Encounter: Payer: Self-pay | Admitting: Oncology

## 2019-09-27 VITALS — BP 153/48 | HR 63 | Temp 99.1°F | Wt 170.3 lb

## 2019-09-27 DIAGNOSIS — D631 Anemia in chronic kidney disease: Secondary | ICD-10-CM | POA: Diagnosis present

## 2019-09-27 DIAGNOSIS — N183 Chronic kidney disease, stage 3 unspecified: Secondary | ICD-10-CM | POA: Insufficient documentation

## 2019-09-27 DIAGNOSIS — D509 Iron deficiency anemia, unspecified: Secondary | ICD-10-CM

## 2019-09-27 DIAGNOSIS — D500 Iron deficiency anemia secondary to blood loss (chronic): Secondary | ICD-10-CM

## 2019-09-27 DIAGNOSIS — I701 Atherosclerosis of renal artery: Secondary | ICD-10-CM | POA: Diagnosis not present

## 2019-09-27 DIAGNOSIS — D649 Anemia, unspecified: Secondary | ICD-10-CM

## 2019-09-27 LAB — CBC WITH DIFFERENTIAL/PLATELET
Abs Immature Granulocytes: 0.07 10*3/uL (ref 0.00–0.07)
Basophils Absolute: 0.1 10*3/uL (ref 0.0–0.1)
Basophils Relative: 1 %
Eosinophils Absolute: 1 10*3/uL — ABNORMAL HIGH (ref 0.0–0.5)
Eosinophils Relative: 10 %
HCT: 27.7 % — ABNORMAL LOW (ref 36.0–46.0)
Hemoglobin: 8.8 g/dL — ABNORMAL LOW (ref 12.0–15.0)
Immature Granulocytes: 1 %
Lymphocytes Relative: 18 %
Lymphs Abs: 1.8 10*3/uL (ref 0.7–4.0)
MCH: 33.1 pg (ref 26.0–34.0)
MCHC: 31.8 g/dL (ref 30.0–36.0)
MCV: 104.1 fL — ABNORMAL HIGH (ref 80.0–100.0)
Monocytes Absolute: 0.9 10*3/uL (ref 0.1–1.0)
Monocytes Relative: 9 %
Neutro Abs: 5.9 10*3/uL (ref 1.7–7.7)
Neutrophils Relative %: 61 %
Platelets: 269 10*3/uL (ref 150–400)
RBC: 2.66 MIL/uL — ABNORMAL LOW (ref 3.87–5.11)
RDW: 16.5 % — ABNORMAL HIGH (ref 11.5–15.5)
WBC: 9.6 10*3/uL (ref 4.0–10.5)
nRBC: 0 % (ref 0.0–0.2)

## 2019-09-27 LAB — IRON AND TIBC
Iron: 41 ug/dL (ref 28–170)
Saturation Ratios: 28 % (ref 10.4–31.8)
TIBC: 146 ug/dL — ABNORMAL LOW (ref 250–450)
UIBC: 105 ug/dL

## 2019-09-27 LAB — SAMPLE TO BLOOD BANK

## 2019-09-27 LAB — FERRITIN: Ferritin: 308 ng/mL — ABNORMAL HIGH (ref 11–307)

## 2019-09-27 MED ORDER — HYDROCODONE-ACETAMINOPHEN 5-325 MG PO TABS: 1.0000 | ORAL_TABLET | Freq: Four times a day (QID) | ORAL | 0 refills | Status: DC | PRN

## 2019-09-27 MED ORDER — EPOETIN ALFA-EPBX 40000 UNIT/ML IJ SOLN
40000.0000 [IU] | Freq: Once | INTRAMUSCULAR | Status: AC
  Administered 2019-09-27: 40000 [IU] via SUBCUTANEOUS
  Filled 2019-09-27: qty 1

## 2019-09-27 NOTE — Progress Notes (Signed)
Patient here today with daughter. States she is in a lot of pain due to shingles in her right shoulder and back. Rates pain at 10. Daughter expresses concern, patient has been in and out of hospital in the last weeks due to falls, she suffered mild heart attach and now has shingles.

## 2019-09-29 ENCOUNTER — Other Ambulatory Visit: Payer: Self-pay

## 2019-09-29 ENCOUNTER — Ambulatory Visit: Payer: Medicare Other | Admitting: Podiatry

## 2019-09-29 ENCOUNTER — Ambulatory Visit (INDEPENDENT_AMBULATORY_CARE_PROVIDER_SITE_OTHER): Payer: Medicare Other | Admitting: Podiatry

## 2019-09-29 ENCOUNTER — Encounter: Payer: Self-pay | Admitting: Podiatry

## 2019-09-29 DIAGNOSIS — L608 Other nail disorders: Secondary | ICD-10-CM

## 2019-09-29 DIAGNOSIS — M79676 Pain in unspecified toe(s): Secondary | ICD-10-CM

## 2019-09-29 DIAGNOSIS — B351 Tinea unguium: Secondary | ICD-10-CM

## 2019-09-29 DIAGNOSIS — E119 Type 2 diabetes mellitus without complications: Secondary | ICD-10-CM | POA: Diagnosis not present

## 2019-09-29 DIAGNOSIS — D689 Coagulation defect, unspecified: Secondary | ICD-10-CM

## 2019-09-29 NOTE — Progress Notes (Signed)
This patient returns to my office for at risk foot care.  This patient requires this care by a professional since this patient will be at risk due to having chronic kidney disease diabetes and coagulation defect.  Patient is taking plavix.  This patient is unable to cut nails herself since the patient cannot reach her nails.These nails are painful walking and wearing shoes.  This patient presents for at risk foot care today. Patient presents to the office in a wheelchair due to shingles.  She is accompanied by family member.  General Appearance  Alert, conversant and in no acute stress.  Vascular  Dorsalis pedis and posterior tibial  pulses are palpable  bilaterally.  Capillary return is within normal limits  bilaterally. Temperature is within normal limits  bilaterally.  Neurologic  Senn-Weinstein monofilament wire test within normal limits  bilaterally. Muscle power within normal limits bilaterally.  Nails Thick disfigured discolored nails with subungual debris  from hallux to fifth toes bilaterally. No evidence of bacterial infection or drainage bilaterally.  Orthopedic  No limitations of motion  feet .  No crepitus or effusions noted.  No bony pathology or digital deformities noted.  Skin  normotropic skin with no porokeratosis noted bilaterally.  No signs of infections or ulcers noted.     Onychomycosis  Pain in right toes  Pain in left toes  Consent was obtained for treatment procedures.   Mechanical debridement of nails 1-5  bilaterally performed with a nail nipper.  Filed with dremel without incident.    Return office visit   3 months                   Told patient to return for periodic foot care and evaluation due to potential at risk complications.   Gardiner Barefoot DPM

## 2019-10-03 ENCOUNTER — Telehealth: Payer: Self-pay

## 2019-10-03 NOTE — Telephone Encounter (Signed)
Telephone call to schedule palliative care visit.  Patients daughter [name] in agreement with RN making home visit on Tuesday 10/04/19 at 10:30 AM.

## 2019-10-04 ENCOUNTER — Other Ambulatory Visit: Payer: Medicare Other

## 2019-10-04 ENCOUNTER — Other Ambulatory Visit: Payer: Self-pay

## 2019-10-04 VITALS — BP 124/58 | HR 53 | Temp 98.7°F | Resp 16

## 2019-10-04 DIAGNOSIS — Z515 Encounter for palliative care: Secondary | ICD-10-CM

## 2019-10-04 NOTE — Progress Notes (Signed)
PATIENT NAME: Amy [name] DOB: [birth year] MRN: [id number]  PRIMARY CARE PROVIDER: [name], MD  RESPONSIBLE PARTY:  Acct ID - Guarantor Home Phone Work Phone Relationship Acct Type  [id number] Amy [name],* [phone]  Self P/F     [name], [place], [place] [address]    PLAN OF CARE and INTERVENTIONS:               1.  GOALS OF CARE/ ADVANCE CARE PLANNING:  Remain at home and be comfortable.               2.  PATIENT/CAREGIVER EDUCATION:  Education on fall precautions, education on s/s of infection, reviewed meds, support               4. PERSONAL EMERGENCY PLAN:  Patient's son and daughter have been taking turns staying with patient and patient has [facility] in place.               5.  DISEASE STATUS: RN made scheduled palliative care home visit. Nurse met with patient and her son Amy [name]. Patient sitting on sofa. Patient states she is in so much pain. Patient continues to have pain from outbreak of shingles 2 months ago. Patient currently rates  pain at 10 in her right arm. Patient saw Dr. Edwina [name] yesterday and received another order for Amy [name]. Patient also continuing Gabapentin 600 milligrams 3 times daily.  Patient states she remains unsteady on her feet and uses rolling walker when ambulating. Son and patient reports patient fell last week. Patient states her granddaughter assisted her with getting up.  Patient reports she did not go to the ED. Patient reports feeling sore after falling. Patient reports her appetite is okay. Patient continues to have shortness of breath at rest.  Patient reports she is not coughing very much. Patient reports she sleeps okay but does wake up at night.  Patient reports she takes her medication then goes back to sleep. Patients children have been taking turns staying with patient. Patient has 1+ edema in left lower extremity and trace edema in right lower extremity. Patient states that MD told her that she may always have pain from  shingles. Nurse reviewed patient's medications with patient and son. Nurse called Dr. Tillman [name] office and requested MD contact Dr. Jonne [name] office as patient and family would like patient to see cardiologist earlier than November. Patient and family remain in agreement with palliative care services. Patient and family encouraged to contact palliative care with questions or concerns.    HISTORY OF PRESENT ILLNESS:  Patient is a 86 year old female who resides at home.  Patient is followed by palliative care and is seen monthly and PRN.  CODE STATUS:  DNR ADVANCED DIRECTIVES: Y MOST FORM: No PPS: 40%   PHYSICAL EXAM:   VITALS: Today's Vitals   10/04/19 1043  BP: (!) 124/58  Pulse: (!) 53  Resp: 16  Temp: 98.7 F (37.1 C)  TempSrc: Temporal  SpO2: 98%  PainSc: 10-Worst pain ever  PainLoc: Arm    LUNGS: clear to auscultation  CARDIAC: Cor Brady  EXTREMITIES: 1+ edema in LLE, trace edema in RLE SKIN: Skin color, texture, turgor normal. No rashes or lesions  NEURO: positive for gait problems and weakness, pain from shingles, forgetful       Amy Simmer, RN

## 2019-10-17 NOTE — Progress Notes (Deleted)
Cardiology Office Note  Date:  10/17/2019   ID:  Amy [name], DOB 24-Nov-1927, MRN [id number]  PCP:  [name], MD   No chief complaint on file.   HPI:  Amy [name] is a pleasant 86 y.o. woman with a PMHx of:  Coronary Artery Disease  Proximal LAD with stent  Incontinence  COPD exacerbations Chronic baseline shortness of breath Morbid obesity due to long history of poor diet Beta blocker has been held in the past secondary to bradycardia.  Off losartan secondary to hyperkalemia, was on HCTZ who now presents for routine follow up of her Coronary Artery Disease  Blood pressure was running high Renal stent placed, dec 14th 2020 BP better Needs refills today 140/50  Lasix QOD  Renal function getting worse over the past year CR 2.01 in dec 14 No repeat since that time  Other lab work reviewed with her Total chol 156 ldl 74, in 2018  Still with some chest congestion Trace leg edema L>R  EKG personally reviewed by myself on todays visit  shows normal sinus rhythm RBBB rate 71 beats per minute  OTHER PAST MEDICAL HISTORY REVIEWED BY ME AT TODAY'S VISIT:  DATE TEST EF   09/2016 Echo  65-70 %   09/2015 Echo  55-60 %   02/2015 Echo 60-65% mild AS, LVH   2019 09/02/2017 EKG showed normal sinus rhythm with rate 74 bpm right bundle branch block hospitalization March  2019 Sepsis due to pneumonia, upper GI bleed melena, acute on chronic diastolic CHF progressive shortness of breath with melena on arrival, pneumonia, treated with antibiotics pleural effusion felt secondary to CHF Asa plavix held prbc x1 HCT 25.7 D/c on plavix with no asa  Stress test 03/2017 Pharmacological myocardial perfusion imaging study with no significant Ischemia Small region of fixed apical thinning, consistent with attenuation artifact GI uptake artifact noted Normal wall motion, EF estimated at 56% No EKG changes concerning for ischemia at peak stress or in recovery. Low risk  scan  2017 hospitalization mid August 2017 COPD exacerbation, required steroids, DuoNeb's, flutter device, antibiotics CT scan showed mucus plugging, postobstructive atelectasis  2015  [facility] August 2015. She had diarrhea, vomiting, felt to be viral also with acute renal failure that improved with hydration. Creatinine reached 1.86, one month later creatinine 1.0 as an outpatient Continues to be weak, does not walk much and is deconditioned  Guaiac positive and she had colonoscopy and EGD. She reports Dr. Vira Agar did not find anything Iron transfusions in the past under the direction of Dr. Ma Hillock.   Prior episode of shortness of breath while walking in the hospital to schedule a colonoscopy appointment. sent to the emergency room for further evaluation. Systolic pressure was in the 170 range. workup at that time was essentially normal and she was discharged home.  2013 01/14/2012 Stress test showed no ischemia, Lexiscan  2011 06/22/2009 Cardiac Catheter performed at [facility] details 90% proximal LAD, 40% mid LAD, 40% diagonal, 60% proximal circumflex followed by 40% circumflex lesion, 30%, 40% and 30% lesion noted in the RCA with distal RCA with 30% and 25% lesions. Mild atheroma in the aorta. Xience 2.75 x 12 mm Drug eluting stent placed.    PMH:   has a past medical history of Bladder incontinence, CAD (coronary artery disease), Cancer ([place]), Carotid arterial disease w/ R Carotid Bruit (HCC), Chronic diastolic (congestive) heart failure (HCC), Chronic Dyspnea on exertion, CKD (chronic kidney disease) stage 3, GFR 30-59 ml/min, Degenerative arthritis of knee, Diabetes  mellitus, GIB (gastrointestinal bleeding), Hiatal hernia, Hypertension, Iron deficiency, Menopausal symptoms, Morbid obesity ([place]), Renal insufficiency, and Thyroid disease.  PSH:    Past Surgical History:  Procedure Laterality Date  . COLONOSCOPY  2015  . COLONOSCOPY WITH PROPOFOL N/A 09/25/2016   Procedure:  COLONOSCOPY WITH PROPOFOL;  Surgeon: [name], MD;  Location: [facility] ENDOSCOPY;  Service: Gastroenterology;  Laterality: N/A;  . COLONOSCOPY WITH PROPOFOL N/A 09/26/2016   Procedure: COLONOSCOPY WITH PROPOFOL;  Surgeon: [name], MD;  Location: [facility] ENDOSCOPY;  Service: Gastroenterology;  Laterality: N/A;  . ESOPHAGOGASTRODUODENOSCOPY (EGD) WITH PROPOFOL N/A 09/25/2016   Procedure: ESOPHAGOGASTRODUODENOSCOPY (EGD) WITH PROPOFOL;  Surgeon: [name], MD;  Location: [facility] ENDOSCOPY;  Service: Gastroenterology;  Laterality: N/A;  . RENAL ANGIOGRAPHY N/A 02/07/2019   Procedure: RENAL ANGIOGRAPHY;  Surgeon: [name], MD;  Location: [facility] CV LAB;  Service: Cardiovascular;  Laterality: N/A;  . REPLACEMENT TOTAL KNEE BILATERAL    . TOTAL VAGINAL HYSTERECTOMY     ovarian mass, not cancerous  . UPPER GI ENDOSCOPY  2015    Current Outpatient Medications  Medication Sig Dispense Refill  . acetaminophen (TYLENOL) 500 MG tablet Take 1 tablet (500 mg total) by mouth every 6 (six) hours as needed for mild pain or moderate pain. Over the counter 30 tablet 0  . albuterol (PROVENTIL HFA;VENTOLIN HFA) 108 (90 Base) MCG/ACT inhaler Inhale 2 puffs into the lungs every 6 (six) hours as needed for wheezing or shortness of breath.    Marland Kitchen aspirin EC 81 MG EC tablet Take 1 tablet (81 mg total) by mouth daily. Swallow whole. (Patient not taking: Reported on 10/04/2019) 30 tablet 11  . calamine lotion Apply topically 3 (three) times daily. Apply to affected areas. Also available OTC. 180 mL 1  . cloNIDine (CATAPRES - DOSED IN MG/24 HR) 0.2 mg/24hr patch Place 1 patch (0.2 mg total) onto the skin once a week. 4 patch 12  . clopidogrel (PLAVIX) 75 MG tablet Take 1 tablet (75 mg total) by mouth daily. 90 tablet 3  . docusate sodium (COLACE) 100 MG capsule Take 100 mg by mouth daily as needed for mild constipation. Takes daily    . erythromycin ophthalmic ointment     . gabapentin (NEURONTIN) 400 MG capsule Take 400  mg by mouth 4 (four) times daily.    . hydrALAZINE (APRESOLINE) 100 MG tablet Take 100 mg by mouth 3 (three) times daily.     Marland Kitchen HYDROcodone-acetaminophen (NORCO/VICODIN) 5-325 MG tablet Take 1 tablet by mouth every 6 (six) hours as needed for moderate pain. 30 tablet 0  . ipratropium-albuterol (DUONEB) 0.5-2.5 (3) MG/3ML SOLN Take 3 mLs by nebulization every 4 (four) hours as needed (shortness of breath/wheezing.). 360 mL   . levothyroxine (SYNTHROID) 88 MCG tablet     . montelukast (SINGULAIR) 10 MG tablet Take 10 mg by mouth at bedtime.     . nitroGLYCERIN (NITROSTAT) 0.4 MG SL tablet Place 1 tablet (0.4 mg total) under the tongue every 5 (five) minutes x 3 doses as needed for chest pain.  12  . oxybutynin (DITROPAN-XL) 5 MG 24 hr tablet Take 5 mg by mouth daily.     . pantoprazole (PROTONIX) 40 MG tablet Take 20 mg by mouth daily.     . simvastatin (ZOCOR) 40 MG tablet Take 1 tablet (40 mg total) by mouth at bedtime. 90 tablet 2  . sodium zirconium cyclosilicate (LOKELMA) 5 g packet Take 5 g by mouth daily.    . SYMBICORT 160-4.5  MCG/ACT inhaler Inhale 2 puffs into the lungs 2 (two) times daily.     Marland Kitchen torsemide (DEMADEX) 20 MG tablet Take 20 mg by mouth 3 (three) times a week.    . traMADol (ULTRAM) 50 MG tablet Take 50-100 mg by mouth every 4 (four) hours as needed.    . valACYclovir (VALTREX) 1000 MG tablet Take 1,000 mg by mouth 2 (two) times daily.      No current facility-administered medications for this visit.   Facility-Administered Medications Ordered in Other Visits  Medication Dose Route Frequency Provider Last Rate Last Admin  . 0.9 %  sodium chloride infusion   Intravenous Continuous Lloyd Huger, MD      . epoetin alfa-epbx (RETACRIT) injection 40,000 Units  40,000 Units Subcutaneous Once Lloyd Huger, MD         Allergies:   Atenolol, Codeine, Losartan, Morphine and related, Nsaids, Rofecoxib, Sucralfate, and Sulfa antibiotics   Social History:  The patient   reports that she has never smoked. She has never used smokeless tobacco. She reports that she does not drink alcohol and does not use drugs.   Family History:   family history includes Breast cancer (age of onset: 80) in her mother.    Review of Systems: Review of Systems  Constitutional: Negative.   HENT: Negative.   Respiratory: Positive for shortness of breath.   Cardiovascular: Positive for leg swelling.  Gastrointestinal: Negative.   Musculoskeletal: Negative.        Gait instability  Neurological: Negative.   Psychiatric/Behavioral: Negative.   All other systems reviewed and are negative.   PHYSICAL EXAM: VS:  There were no vitals taken for this visit. , BMI There is no height or weight on file to calculate BMI.  Constitutional:  oriented to person, place, and time. No distress.  Obese HENT:  Head: Grossly normal Eyes:  no discharge. No scleral icterus.  Neck: No JVD, no carotid bruits  Cardiovascular: Regular rate and rhythm, no murmurs appreciated Trace edema L>R Pulmonary/Chest: Clear to auscultation bilaterally, no wheezes or rails Abdominal: Soft.  no distension.  no tenderness.  Musculoskeletal: Normal range of motion Neurological:  normal muscle tone. Coordination normal. No atrophy Skin: Skin warm and dry Psychiatric: normal affect, pleasant   Recent Labs: 08/12/2019: ALT 11; B Natriuretic Peptide 58.8 08/13/2019: TSH 1.962 08/23/2019: BUN 58; Creatinine, Ser 1.84; Magnesium 1.7; Potassium 5.3; Sodium 138 09/27/2019: Hemoglobin 8.8; Platelets 269    Lipid Panel Lab Results  Component Value Date   CHOL 110 08/19/2019   HDL 37 (L) 08/19/2019   LDLCALC 62 08/19/2019   TRIG 55 08/19/2019      Wt Readings from Last 3 Encounters:  09/27/19 170 lb 4.8 oz (77.2 kg)  09/05/19 181 lb 3.2 oz (82.2 kg)  08/23/19 168 lb 3.4 oz (76.3 kg)       ASSESSMENT AND PLAN:  SOB (shortness of breath) Baseline deconditioning, anemia, HTN, COPD, diastolic  CHF Deconditioned Recent URI treated with ABX/prednisone Will need to monitor closely to confirm no worsening heart failure Hesitant to increase Lasix given worsening renal failure  Coronary artery disease of native artery of native heart with stable angina pectoris (HCC)  Currently with no symptoms of angina. No further workup at this time. Continue current medication regimen.  Essential hypertension  Blood pressure is well controlled on today's visit. No changes made to the medications.  Chronic diastolic CHF (congestive heart failure) (HCC) Lasix QOD Recheck BMP today, CR 2.0 in dec 2020  May need Lasix daily if renal function improving  Paroxysmal atrial fibrillation (HCC)  Fall risk, In NSR  COPD exacerbation (HCC) Albuterol BID  Secondhand smoke exposure Also nebulizer  Acute renal failure superimposed on stage 3 chronic kidney disease, unspecified acute renal failure type ([place]) Recent renal stent, recheck BMP  Anemia, unspecified type followed by hematology Dr. Grayland Ormond  Total encounter time more than 25 minutes. Greater than 50% was spent in counseling and coordination of care with the patient.  Disposition:   F/U  6 months   Signed, Esmond Plants, M.D., Ph.D. 10/17/2019  [place], [place]

## 2019-10-18 ENCOUNTER — Other Ambulatory Visit: Payer: Self-pay

## 2019-10-18 ENCOUNTER — Telehealth: Payer: Self-pay | Admitting: Student in an Organized Health Care Education/Training Program

## 2019-10-18 ENCOUNTER — Encounter: Payer: Self-pay | Admitting: Student in an Organized Health Care Education/Training Program

## 2019-10-18 ENCOUNTER — Ambulatory Visit
Payer: Medicare Other | Attending: Student in an Organized Health Care Education/Training Program | Admitting: Student in an Organized Health Care Education/Training Program

## 2019-10-18 VITALS — BP 113/98 | HR 80 | Temp 97.1°F | Resp 16 | Ht 62.0 in | Wt 160.0 lb

## 2019-10-18 DIAGNOSIS — B0229 Other postherpetic nervous system involvement: Secondary | ICD-10-CM | POA: Diagnosis present

## 2019-10-18 DIAGNOSIS — M792 Neuralgia and neuritis, unspecified: Secondary | ICD-10-CM

## 2019-10-18 DIAGNOSIS — G894 Chronic pain syndrome: Secondary | ICD-10-CM | POA: Diagnosis present

## 2019-10-18 MED ORDER — PREGABALIN 50 MG PO CAPS: ORAL_CAPSULE | ORAL | 0 refills | Status: DC

## 2019-10-18 MED ORDER — CAPSAICIN 0.035 % EX CREA: TOPICAL_CREAM | CUTANEOUS | 0 refills | Status: DC

## 2019-10-18 NOTE — Patient Instructions (Signed)
Pregabalin capsules What is this medicine? PREGABALIN (pre GAB a lin) is used to treat nerve pain from diabetes, shingles, spinal cord injury, and fibromyalgia. It is also used to control seizures in epilepsy. This medicine may be used for other purposes; ask your health care provider or pharmacist if you have questions. COMMON BRAND NAME(S): Lyrica What should I tell my health care provider before I take this medicine? They need to know if you have any of these conditions:  heart disease  history of drug abuse or alcohol abuse problem  kidney disease  lung or breathing disease  suicidal thoughts, plans, or attempt; a previous suicide attempt by you or a family member  an unusual or allergic reaction to pregabalin, gabapentin, other medicines, foods, dyes, or preservatives  pregnant or trying to get pregnant  breast-feeding How should I use this medicine? Take this medicine by mouth with a glass of water. Follow the directions on the prescription label. You can take it with or without food. If it upsets your stomach, take it with food. Take your medicine at regular intervals. Do not take it more often than directed. Do not stop taking except on your doctor's advice. A special MedGuide will be given to you by the pharmacist with each prescription and refill. Be sure to read this information carefully each time. Talk to your pediatrician regarding the use of this medicine in children. While this drug may be prescribed for children as young as 1 month for selected conditions, precautions do apply. Overdosage: If you think you have taken too much of this medicine contact a poison control center or emergency room at once. NOTE: This medicine is only for you. Do not share this medicine with others. What if I miss a dose? If you miss a dose, take it as soon as you can. If it is almost time for your next dose, take only that dose. Do not take double or extra doses. What may interact with this  medicine? This medicine may interact with the following medications:  alcohol  antihistamines for allergy, cough, and cold  certain medicines for anxiety or sleep  certain medicines for depression like amitriptyline, fluoxetine, sertraline  certain medicines for diabetes  certain medicines for seizures like phenobarbital, primidone  general anesthetics like halothane, isoflurane, methoxyflurane, propofol  local anesthetics like lidocaine, pramoxine, tetracaine  medicines that relax muscles for surgery  narcotic medicines for pain  phenothiazines like chlorpromazine, mesoridazine, prochlorperazine, thioridazine This list may not describe all possible interactions. Give your health care provider a list of all the medicines, herbs, non-prescription drugs, or dietary supplements you use. Also tell them if you smoke, drink alcohol, or use illegal drugs. Some items may interact with your medicine. What should I watch for while using this medicine? Tell your doctor or healthcare professional if your symptoms do not start to get better or if they get worse. Visit your doctor or health care professional for regular checks on your progress. Do not stop taking except on your doctor's advice. You may develop a severe reaction. Your doctor will tell you how much medicine to take. Wear a medical identification bracelet or chain if you are taking this medicine for seizures, and carry a card that describes your disease and details of your medicine and dosage times. You may get drowsy or dizzy. Do not drive, use machinery, or do anything that needs mental alertness until you know how this medicine affects you. Do not stand or sit up quickly, especially if  you are an older patient. This reduces the risk of dizzy or fainting spells. Alcohol may interfere with the effect of this medicine. Avoid alcoholic drinks. If you have a heart condition, like congestive heart failure, and notice that you are retaining  water and have swelling in your hands or feet, contact your health care provider immediately. The use of this medicine may increase the chance of suicidal thoughts or actions. Pay special attention to how you are responding while on this medicine. Any worsening of mood, or thoughts of suicide or dying should be reported to your health care professional right away. This medicine has caused reduced sperm counts in some men. This may interfere with the ability to father a child. You should talk to your doctor or health care professional if you are concerned about your fertility. Women who become pregnant while using this medicine for seizures may enroll in the [place] Pregnancy Registry by calling [phone]. This registry collects information about the safety of antiepileptic drug use during pregnancy. What side effects may I notice from receiving this medicine? Side effects that you should report to your doctor or health care professional as soon as possible:  allergic reactions like skin rash, itching or hives, swelling of the face, lips, or tongue  breathing problems  changes in vision  chest pain  confusion  jerking or unusual movements of any part of your body  loss of memory  muscle pain, tenderness, or weakness  suicidal thoughts or other mood changes  swelling of the ankles, feet, hands  unusual bruising or bleeding Side effects that usually do not require medical attention (report to your doctor or health care professional if they continue or are bothersome):  dizziness  drowsiness  dry mouth  headache  nausea  tremors  trouble sleeping  weight gain This list may not describe all possible side effects. Call your doctor for medical advice about side effects. You may report side effects to FDA at 1-800-FDA-1088. Where should I keep my medicine? Keep out of the reach of children. This medicine can be abused. Keep your medicine in a safe  place to protect it from theft. Do not share this medicine with anyone. Selling or giving away this medicine is dangerous and against the law. This medicine may cause accidental overdose and death if it taken by other adults, children, or pets. Mix any unused medicine with a substance like cat litter or coffee grounds. Then throw the medicine away in a sealed container like a sealed bag or a coffee can with a lid. Do not use the medicine after the expiration date. Store at room temperature between 15 and 30 degrees C (59 and 86 degrees F). NOTE: This sheet is a summary. It may not cover all possible information. If you have questions about this medicine, talk to your doctor, pharmacist, or health care provider.  2020 Elsevier/Gold Standard (2018-02-12 13:15:55)

## 2019-10-18 NOTE — Telephone Encounter (Signed)
Daughter [name] notified PA for Lyrica will be submitted today.

## 2019-10-18 NOTE — Telephone Encounter (Signed)
Patient's daughter Carlette Palmatier called stating the med sent in by Dr. Holley Raring needs prior auth before it can be filled.

## 2019-10-18 NOTE — Progress Notes (Signed)
Patient: Amy [name]  Service Category: E/M  Provider: Gillis Santa, MD  DOB: December 31, 1927  DOS: 10/18/2019  Referring Provider: [name], MD  MRN: [id number]  Setting: Ambulatory outpatient  PCP: Amy Hire, MD  Type: New Patient  Specialty: Interventional Pain Management    Location: Office  Delivery: Face-to-face     Primary Reason(s) for Visit: Encounter for initial evaluation of one or more chronic problems (new to examiner) potentially causing chronic pain, and posing a threat to normal musculoskeletal function. (Level of risk: High) CC: Arm Pain (right) and Flank Pain (right)  HPI  Amy [name] is a 86 y.o. year old, female patient, who comes for the first time to our practice referred by Amy Hire, MD Amy [name],  Amy [name] Amy [name], for our initial evaluation of her chronic pain. She has HYPOTHYROIDISM-IATROGENIC; HLD (hyperlipidemia); Essential hypertension; Coronary artery disease involving native coronary artery of native heart without angina pectoris; Iron deficiency anemia; Preop cardiovascular exam; Chronic diastolic CHF (congestive heart failure) (Amy [name]); Morbid obesity (Amy [name]); Angina pectoris associated with type 2 diabetes mellitus (Amy [name]); Palliative care encounter; Acute kidney injury superimposed on CKD (Amy [name]); New onset atrial fibrillation (Amy [name]); Anemia; COPD with chronic bronchitis (Amy [name]); Multiple lung nodules; Vitamin D deficiency; GI bleed; CAP (community acquired pneumonia); Diabetes (Amy [name]); CKD (chronic kidney disease) stage 4, GFR 15-29 ml/min (Amy [name]); Multiple renal cysts; Renal stone; Type 2 diabetes mellitus with hyperglycemia (Amy [name]); Renal artery stenosis (Amy [name]); Renovascular hypertension; Chest pain; Unresponsiveness; Hyperkalemia; HTN (hypertension); GERD (gastroesophageal reflux disease); Hypothyroidism; CAD (coronary artery disease); Carotid arterial disease (Amy [name]); Hypotension; Shingles; Groin pain, right; Syncope and collapse; NSTEMI (non-ST  elevated myocardial infarction) (Amy [name]); Postherpetic neuralgia; Neuropathic pain of chest; and Chronic pain syndrome on their problem list. Today she comes in for evaluation of her Arm Pain (right) and Flank Pain (right)  Pain Assessment: Location: Right Arm Radiating: right flank and chest Onset: More than a month ago Duration: Chronic pain Quality: Burning Severity: 6 /10 (subjective, self-reported pain score)  Effect on ADL: limits activities Timing: Constant Modifying factors: rest, sleeping BP: (!) 113/98  HR: 80  Onset and Duration: Sudden and Present longer than 3 months Cause of pain: shingles Severity: No change since onset, NAS-11 at its worse: 10/10, NAS-11 at its best: 10/10, NAS-11 now: 10/10 and NAS-11 on the average: 10/10 Timing: Not influenced by the time of the day Aggravating Factors: Bending, Climbing, Eating, Kneeling, Lifiting, Motion, Prolonged sitting, Prolonged standing, Squatting, Stooping , Twisting, Walking, Walking uphill and Walking downhill Alleviating Factors: Resting and Sleeping Associated Problems: Pain that wakes patient up Quality of Pain: Burning, Constant, Deep and Dreadful Previous Examinations or Tests: The patient denies none Previous Treatments: Narcotic medications  Amy [name] is a pleasant 86 year old female who is accompanied by her son with a chief complaint of right arm, right flank, right chest pain that is neuropathic in nature secondary to postherpetic neuralgia.  Patient had shingles outbreak in mid June.  She has developed postherpetic neuralgia that has been refractory to medication management including tricyclic antidepressant: Amitriptyline, gabapentin and escalating dose of 400 mg 4 times daily as needed, oxycodone, hydrocodone.  She states that the pain is very debilitating for her.  She finds it difficult to perform ADLs although she is fairly limited in her functional capacity.   Meds   Current Outpatient Medications:  .  albuterol  (PROVENTIL HFA;VENTOLIN HFA) 108 (90 Base) MCG/ACT inhaler, Inhale 2 puffs into the lungs every 6 (six) hours as needed for  wheezing or shortness of breath., Disp: , Rfl:  .  aspirin EC 81 MG EC tablet, Take 1 tablet (81 mg total) by mouth daily. Swallow whole., Disp: 30 tablet, Rfl: 11 .  cloNIDine (CATAPRES - DOSED IN MG/24 HR) 0.2 mg/24hr patch, Place 1 patch (0.2 mg total) onto the skin once a week., Disp: 4 patch, Rfl: 12 .  clopidogrel (PLAVIX) 75 MG tablet, Take 1 tablet (75 mg total) by mouth daily., Disp: 90 tablet, Rfl: 3 .  hydrALAZINE (APRESOLINE) 100 MG tablet, Take 100 mg by mouth 3 (three) times daily. , Disp: , Rfl:  .  levothyroxine (SYNTHROID) 88 MCG tablet, 88 mcg daily before breakfast. , Disp: , Rfl:  .  montelukast (SINGULAIR) 10 MG tablet, Take 10 mg by mouth at bedtime. , Disp: , Rfl:  .  nitroGLYCERIN (NITROSTAT) 0.4 MG SL tablet, Place 1 tablet (0.4 mg total) under the tongue every 5 (five) minutes x 3 doses as needed for chest pain., Disp: , Rfl: 12 .  oxybutynin (DITROPAN-XL) 5 MG 24 hr tablet, Take 5 mg by mouth daily. , Disp: , Rfl:  .  oxyCODONE-acetaminophen (PERCOCET) 10-325 MG tablet, Take 1 tablet by mouth every 8 (eight) hours as needed for pain., Disp: , Rfl:  .  pantoprazole (PROTONIX) 40 MG tablet, Take 20 mg by mouth daily. , Disp: , Rfl:  .  simvastatin (ZOCOR) 40 MG tablet, Take 1 tablet (40 mg total) by mouth at bedtime., Disp: 90 tablet, Rfl: 2 .  SYMBICORT 160-4.5 MCG/ACT inhaler, Inhale 2 puffs into the lungs 2 (two) times daily. , Disp: , Rfl:  .  torsemide (DEMADEX) 20 MG tablet, Take 20 mg by mouth 3 (three) times a week., Disp: , Rfl:  .  Capsaicin 0.035 % CREA, Apply to affected region up to 3 times a day, Disp: 42.5 g, Rfl: 0 .  pregabalin (LYRICA) 50 MG capsule, Take 1 capsule (50 mg total) by mouth daily for 15 days, THEN 1 capsule (50 mg total) 2 (two) times daily., Disp: 75 capsule, Rfl: 0 No current facility-administered medications for this  visit.  Facility-Administered Medications Ordered in Other Visits:  .  0.9 %  sodium chloride infusion, , Intravenous, Continuous, Finnegan, Kathlene November, MD .  epoetin alfa-epbx (RETACRIT) injection 40,000 Units, 40,000 Units, Subcutaneous, Once, [place]land Ormond, Kathlene November, MD  Imaging Review   Cervical CT wo contrast: Results for orders placed during the hospital encounter of 08/18/19  CT Cervical Spine Wo Contrast  Narrative CLINICAL DATA:  Status post fall.  EXAM: CT CERVICAL SPINE WITHOUT CONTRAST  TECHNIQUE: Multidetector CT imaging of the cervical spine was performed without intravenous contrast. Multiplanar CT image reconstructions were also generated.  COMPARISON:  None.  FINDINGS: Alignment: Normal.  Skull base and vertebrae: No acute fracture. No primary bone lesion or focal pathologic process.  Soft tissues and spinal canal: No prevertebral fluid or swelling. No visible canal hematoma.  Disc levels: Marked severity endplate sclerosis is seen at the levels of C5-C6 and C6-C7. Moderate to marked severity intervertebral disc space narrowing is also seen at these levels. Mild multilevel endplate sclerosis and mild intervertebral disc space narrowing is seen throughout the remainder of the cervical spine.  There is moderate to marked severity bilateral multilevel facet joint hypertrophy.  Upper chest: Moderate severity right apical scarring and/or atelectasis is seen.  Pleural fluid is also seen along the right apex.  Other: None.  IMPRESSION: 1. No acute fracture within the cervical spine. 2. Marked severity  degenerative changes at the levels of C5-C6 and C6-C7. 3. Moderate severity right apical scarring and/or atelectasis. 4. Right apical pleural fluid.   Electronically Signed By: Virgina Norfolk M.D. On: 08/18/2019 17:40    Narrative CLINICAL DATA:  Right groin pain  EXAM: DG HIP (WITH OR WITHOUT PELVIS) 2-3V RIGHT  COMPARISON:   None.  FINDINGS: Degenerative changes of the right hip joint are noted. Pelvic ring is intact. No acute fracture or dislocation is noted.  IMPRESSION: Significant degenerative change of the right hip.   Electronically Signed By: Inez Catalina M.D. On: 08/12/2019 16:49    Narrative CLINICAL DATA:  Patient fell and is now having discomfort in her left knee.  EXAM: LEFT KNEE - COMPLETE 4+ VIEW  COMPARISON:  None.  FINDINGS: Components of left knee arthroplasty project in expected location. Negative for fracture or dislocation. Regional soft tissues unremarkable.  IMPRESSION: 1. Left knee arthroplasty.  No acute findings.   Electronically Signed By: Lucrezia Europe M.D. On: 09/21/2016 11:10  Complexity Note: Imaging results reviewed. Results shared with Ms. Shindler, using Layman's terms.                         ROS  Cardiovascular: Heart attack ( Date: pt did not put a date) Pulmonary or Respiratory: Lung problems Neurological: No reported neurological signs or symptoms such as seizures, abnormal skin sensations, urinary and/or fecal incontinence, being born with an abnormal open spine and/or a tethered spinal cord Psychological-Psychiatric: No reported psychological or psychiatric signs or symptoms such as difficulty sleeping, anxiety, depression, delusions or hallucinations (schizophrenial), mood swings (bipolar disorders) or suicidal ideations or attempts Gastrointestinal: No reported gastrointestinal signs or symptoms such as vomiting or evacuating blood, reflux, heartburn, alternating episodes of diarrhea and constipation, inflamed or scarred liver, or pancreas or irrregular and/or infrequent bowel movements Genitourinary: Kidney disease Hematological: No reported hematological signs or symptoms such as prolonged bleeding, low or poor functioning platelets, bruising or bleeding easily, hereditary bleeding problems, low energy levels due to low hemoglobin or being  anemic Endocrine: High blood sugar requiring insulin (IDDM) and Slow thyroid Rheumatologic: No reported rheumatological signs and symptoms such as fatigue, joint pain, tenderness, swelling, redness, heat, stiffness, decreased range of motion, with or without associated rash Musculoskeletal: Negative for myasthenia gravis, muscular dystrophy, multiple sclerosis or malignant hyperthermia Work History: Retired  Allergies  Ms. Ra is allergic to atenolol, codeine, losartan, morphine and related, nsaids, rofecoxib, sucralfate, and sulfa antibiotics.  Laboratory Chemistry Profile   Renal Lab Results  Component Value Date   BUN 58 (H) 08/23/2019   CREATININE 1.84 (H) 08/23/2019   BCR 24 03/15/2019   GFRAA 27 (L) 08/23/2019   GFRNONAA 24 (L) 08/23/2019   SPECGRAV 1.015 03/15/2019   PHUR 5.5 03/15/2019   PROTEINUR NEGATIVE 08/18/2019     Electrolytes Lab Results  Component Value Date   NA 138 08/23/2019   K 5.3 (H) 08/23/2019   CL 113 (H) 08/23/2019   CALCIUM 8.4 (L) 08/23/2019   MG 1.7 08/23/2019     Hepatic Lab Results  Component Value Date   AST 14 (L) 08/12/2019   ALT 11 08/12/2019   ALBUMIN 2.9 (L) 08/12/2019   ALKPHOS 58 08/12/2019   LIPASE 21 08/06/2019   AMMONIA <9 (L) 08/13/2019     ID Lab Results  Component Value Date   SARSCOV2NAA NEGATIVE 08/18/2019     Bone Lab Results  Component Value Date   VD25OH 6.3 (L)  10/22/2015     Endocrine Lab Results  Component Value Date   GLUCOSE 111 (H) 08/23/2019   GLUCOSEU NEGATIVE 08/18/2019   HGBA1C 5.6 08/18/2019   TSH 1.962 08/13/2019   FREET4 0.82 10/03/2015     Neuropathy Lab Results  Component Value Date   VITAMINB12 963 (H) 07/27/2019   FOLATE 9.2 07/27/2019   HGBA1C 5.6 08/18/2019     CNS No results found for: COLORCSF, APPEARCSF, RBCCOUNTCSF, WBCCSF, POLYSCSF, LYMPHSCSF, EOSCSF, PROTEINCSF, GLUCCSF, JCVIRUS, CSFOLI, IGGCSF, LABACHR, ACETBL, LABACHR, ACETBL   Inflammation (CRP: Acute  ESR:  Chronic) Lab Results  Component Value Date   LATICACIDVEN 1.4 08/12/2019     Rheumatology No results found for: RF, ANA, LABURIC, URICUR, LYMEIGGIGMAB, LYMEABIGMQN, HLAB27   Coagulation Lab Results  Component Value Date   INR 1.3 (H) 08/18/2019   LABPROT 15.4 (H) 08/18/2019   APTT >160 (HH) 08/18/2019   PLT 269 09/27/2019     Cardiovascular Lab Results  Component Value Date   BNP 58.8 08/12/2019   CKTOTAL 286 (H) 10/12/2013   CKMB 1.0 10/12/2013   TROPONINI <0.03 05/15/2017   HGB 8.8 (L) 09/27/2019   HCT 27.7 (L) 09/27/2019     Screening Lab Results  Component Value Date   SARSCOV2NAA NEGATIVE 08/18/2019     Cancer No results found for: CEA, CA125, LABCA2   Allergens No results found for: ALMOND, APPLE, ASPARAGUS, AVOCADO, BANANA, BARLEY, BASIL, BAYLEAF, GREENBEAN, LIMABEAN, WHITEBEAN, BEEFIGE, REDBEET, BLUEBERRY, BROCCOLI, CABBAGE, MELON, CARROT, CASEIN, CASHEWNUT, CAULIFLOWER, CELERY     Note: Lab results reviewed.  PFSH  Drug: Ms. Weinrich  reports no history of drug use. Alcohol:  reports no history of alcohol use. Tobacco:  reports that she has never smoked. She has never used smokeless tobacco. Medical:  has a past medical history of Bladder incontinence, CAD (coronary artery disease), Cancer ([place]), Carotid arterial disease w/ R Carotid Bruit (HCC), Chronic diastolic (congestive) heart failure (HCC), Chronic Dyspnea on exertion, CKD (chronic kidney disease) stage 3, GFR 30-59 ml/min, Degenerative arthritis of knee, Diabetes mellitus, GIB (gastrointestinal bleeding), Hiatal hernia, Hypertension, Iron deficiency, Menopausal symptoms, Morbid obesity ([place]), Renal insufficiency, and Thyroid disease. Family: family history includes Breast cancer (age of onset: 19) in her mother.  Past Surgical History:  Procedure Laterality Date  . COLONOSCOPY  2015  . COLONOSCOPY WITH PROPOFOL N/A 09/25/2016   Procedure: COLONOSCOPY WITH PROPOFOL;  Surgeon: [name], MD;   Location: [facility] ENDOSCOPY;  Service: Gastroenterology;  Laterality: N/A;  . COLONOSCOPY WITH PROPOFOL N/A 09/26/2016   Procedure: COLONOSCOPY WITH PROPOFOL;  Surgeon: [name], MD;  Location: [facility] ENDOSCOPY;  Service: Gastroenterology;  Laterality: N/A;  . ESOPHAGOGASTRODUODENOSCOPY (EGD) WITH PROPOFOL N/A 09/25/2016   Procedure: ESOPHAGOGASTRODUODENOSCOPY (EGD) WITH PROPOFOL;  Surgeon: [name], MD;  Location: [facility] ENDOSCOPY;  Service: Gastroenterology;  Laterality: N/A;  . RENAL ANGIOGRAPHY N/A 02/07/2019   Procedure: RENAL ANGIOGRAPHY;  Surgeon: [name], MD;  Location: [place] CV LAB;  Service: Cardiovascular;  Laterality: N/A;  . REPLACEMENT TOTAL KNEE BILATERAL    . TOTAL VAGINAL HYSTERECTOMY     ovarian mass, not cancerous  . UPPER GI ENDOSCOPY  2015   Active Ambulatory Problems    Diagnosis Date Noted  . HYPOTHYROIDISM-IATROGENIC 07/12/2009  . HLD (hyperlipidemia) 07/18/2009  . Essential hypertension 07/12/2009  . Coronary artery disease involving native coronary artery of native heart without angina pectoris 07/18/2009  . Iron deficiency anemia 09/08/2011  . Preop cardiovascular exam 06/03/2012  . Chronic diastolic CHF (congestive heart failure) (  [place]) 09/06/2013  . Morbid obesity ([place]) 12/05/2014  . Angina pectoris associated with type 2 diabetes mellitus ([place]) 12/05/2014  . Palliative care encounter 06/05/2015  . Acute kidney injury superimposed on CKD ([place]) 10/03/2015  . New onset atrial fibrillation ([place]) 10/04/2015  . Anemia 10/04/2015  . COPD with chronic bronchitis ([place])   . Multiple lung nodules   . Vitamin D deficiency 11/04/2015  . GI bleed 09/21/2016  . CAP (community acquired pneumonia) 05/16/2017  . Diabetes ([place]) 06/29/2017  . CKD (chronic kidney disease) stage 4, GFR 15-29 ml/min (HCC) 11/23/2018  . Multiple renal cysts 12/29/2018  . Renal stone 12/29/2018  . Type 2 diabetes mellitus with hyperglycemia ([place]) 08/22/2014  . Renal artery stenosis  ([place]) 01/28/2019  . Renovascular hypertension 03/08/2019  . Chest pain 08/06/2019  . Unresponsiveness 08/12/2019  . Hyperkalemia 08/12/2019  . HTN (hypertension) 08/12/2019  . GERD (gastroesophageal reflux disease) 08/12/2019  . Hypothyroidism 08/12/2019  . CAD (coronary artery disease) 08/12/2019  . Carotid arterial disease ([place])   . Hypotension   . Shingles 08/12/2019  . Groin pain, right   . Syncope and collapse 08/13/2019  . NSTEMI (non-ST elevated myocardial infarction) ([place]) 08/18/2019  . Postherpetic neuralgia 10/18/2019  . Neuropathic pain of chest 10/18/2019  . Chronic pain syndrome 10/18/2019   Resolved Ambulatory Problems    Diagnosis Date Noted  . DYSPNEA 01/21/2010  . Diabetes type 2, uncontrolled ([place]) 12/05/2014  . Acute on chronic diastolic (congestive) heart failure ([place]) 10/01/2015  . COPD exacerbation ([place]) 10/04/2015  . Acute bronchitis   . Sepsis ([place]) 05/16/2017   Past Medical History:  Diagnosis Date  . Bladder incontinence   . Cancer ([place])   . Chronic diastolic (congestive) heart failure ([place])   . Chronic Dyspnea on exertion   . CKD (chronic kidney disease) stage 3, GFR 30-59 ml/min   . Degenerative arthritis of knee   . Diabetes mellitus   . GIB (gastrointestinal bleeding)   . Hiatal hernia   . Hypertension   . Iron deficiency   . Menopausal symptoms   . Renal insufficiency   . Thyroid disease    Constitutional Exam  General appearance: alert, cooperative and in mild distress Vitals:   10/18/19 0944  BP: (!) 113/98  Pulse: 80  Resp: 16  Temp: (!) 97.1 F (36.2 C)  TempSrc: Temporal  SpO2: 100%  Weight: 160 lb (72.6 kg)  Height: 5' 2"  (1.575 m)   BMI Assessment: Estimated body mass index is 29.26 kg/m as calculated from the following:   Height as of this encounter: 5' 2"  (1.575 m).   Weight as of this encounter: 160 lb (72.6 kg).  BMI interpretation table: BMI level Category Range association with higher incidence of chronic  pain  <18 kg/m2 Underweight   18.5-24.9 kg/m2 Ideal body weight   25-29.9 kg/m2 Overweight Increased incidence by 20%  30-34.9 kg/m2 Obese (Class I) Increased incidence by 68%  35-39.9 kg/m2 Severe obesity (Class II) Increased incidence by 136%  >40 kg/m2 Extreme obesity (Class III) Increased incidence by 254%   Patient's current BMI Ideal Body weight  Body mass index is 29.26 kg/m. Ideal body weight: 50.1 kg (110 lb 7.2 oz) Adjusted ideal body weight: 59.1 kg (130 lb 4.3 oz)   BMI Readings from Last 4 Encounters:  10/18/19 29.26 kg/m  09/27/19 31.15 kg/m  09/05/19 33.14 kg/m  08/23/19 30.77 kg/m   Wt Readings from Last 4 Encounters:  10/18/19 160 lb (72.6 kg)  09/27/19 170 lb  4.8 oz (77.2 kg)  09/05/19 181 lb 3.2 oz (82.2 kg)  08/23/19 168 lb 3.4 oz (76.3 kg)    Psych/Mental status: Alert, oriented x 3 (person, place, & time)       Eyes: PERLA Respiratory: No evidence of acute respiratory distress  Cervical Spine Exam  Skin & Axial Inspection: No masses, redness, edema, swelling, or associated skin lesions Alignment: Symmetrical Functional ROM: Pain restricted ROM      Stability: No instability detected Muscle Tone/Strength: Functionally intact. No obvious neuro-muscular anomalies detected. Sensory (Neurological): Unimpaired Palpation: No palpable anomalies              Upper Extremity (UE) Exam    Side: Right upper extremity  Side: Left upper extremity  Skin & Extremity Inspection: Positive color changes  Skin & Extremity Inspection: Skin color, temperature, and hair growth are WNL. No peripheral edema or cyanosis. No masses, redness, swelling, asymmetry, or associated skin lesions. No contractures.  Functional ROM: Pain restricted ROM          Functional ROM: Unrestricted ROM          Muscle Tone/Strength: Functionally intact. No obvious neuro-muscular anomalies detected.   Muscle Tone/Strength: Functionally intact. No obvious neuro-muscular anomalies detected.   Sensory (Neurological): Neuropathic pain pattern          Sensory (Neurological): Unimpaired          Palpation: No palpable anomalies              Palpation: No palpable anomalies              Provocative Test(s):  Phalen's test: deferred Tinel's test: deferred Apley's scratch test (touch opposite shoulder):  Action 1 (Across chest): deferred Action 2 (Overhead): deferred Action 3 (LB reach): deferred   Provocative Test(s):  Phalen's test: deferred Tinel's test: deferred Apley's scratch test (touch opposite shoulder):  Action 1 (Across chest): deferred Action 2 (Overhead): deferred Action 3 (LB reach): deferred    Thoracic Spine Area Exam  Skin & Axial Inspection: No masses, redness, or swelling Alignment: Symmetrical Functional ROM: Unrestricted ROM Stability: No instability detected Muscle Tone/Strength: Functionally intact. No obvious neuro-muscular anomalies detected. Sensory (Neurological): Neuropathic pain pattern Muscle strength & Tone: No palpable anomalies    Gait & Posture Assessment  Ambulation: Patient came in today in a wheel chair Gait: Limited. Using assistive device to ambulate Posture: Difficulty standing up straight, due to pain   Lower Extremity Exam    Side: Right lower extremity  Side: Left lower extremity  Stability: No instability observed          Stability: No instability observed          Skin & Extremity Inspection: Skin color, temperature, and hair growth are WNL. No peripheral edema or cyanosis. No masses, redness, swelling, asymmetry, or associated skin lesions. No contractures.  Skin & Extremity Inspection: Skin color, temperature, and hair growth are WNL. No peripheral edema or cyanosis. No masses, redness, swelling, asymmetry, or associated skin lesions. No contractures.  Functional ROM: Pain restricted ROM                  Functional ROM: Pain restricted ROM                  Muscle Tone/Strength: Functionally intact. No obvious  neuro-muscular anomalies detected.  Muscle Tone/Strength: Functionally intact. No obvious neuro-muscular anomalies detected.  Sensory (Neurological): Unimpaired        Sensory (Neurological): Unimpaired  DTR: Patellar: deferred today Achilles: deferred today Plantar: deferred today  DTR: Patellar: deferred today Achilles: deferred today Plantar: deferred today  Palpation: No palpable anomalies  Palpation: No palpable anomalies   Assessment  Primary Diagnosis & Pertinent Problem List: The primary encounter diagnosis was Postherpetic neuralgia. Diagnoses of Neuropathic pain of chest and Chronic pain syndrome were also pertinent to this visit.  Visit Diagnosis (New problems to examiner): 1. Postherpetic neuralgia   2. Neuropathic pain of chest   3. Chronic pain syndrome    Plan of Care (Initial workup plan)    Problem-specific plan: Postherpetic neuralgia chief complaint of right arm, right flank, right chest pain that is neuropathic in nature secondary to postherpetic neuralgia.  Patient had shingles outbreak in mid June.  She has developed postherpetic neuralgia that has been refractory to medication management including tricyclic antidepressant: Amitriptyline, gabapentin and escalating dose of 400 mg 4 times daily as needed, oxycodone, hydrocodone.  She states that the pain is very debilitating for her.  She finds it difficult to perform ADLs although she is fairly limited in her functional capacity.   Given that she has failed various neuropathic's, we discussed trial of Lyrica as below.  Risks and benefits reviewed.  Also recommend capsaicin cream as below.  Do not recommend chronic opioid therapy as it has resulted in side effects of drowsiness, sedation without any analgesic or functional benefit.  Pharmacotherapy (current): Medications ordered:  Meds ordered this encounter  Medications  . pregabalin (LYRICA) 50 MG capsule    Sig: Take 1 capsule (50 mg total) by mouth  daily for 15 days, THEN 1 capsule (50 mg total) 2 (two) times daily.    Dispense:  75 capsule    Refill:  0    Fill one day early if pharmacy is closed on scheduled refill date. May substitute for generic if available.  . Capsaicin 0.035 % CREA    Sig: Apply to affected region up to 3 times a day    Dispense:  42.5 g    Refill:  0   Medications administered during this visit: Bich C. Kinlaw had no medications administered during this visit.    Provider-requested follow-up: Return in about 6 weeks (around 11/29/2019) for Medication Management, virtual.  Future Appointments  Date Time Provider [place]  10/25/2019  9:45 AM [facility] None  10/25/2019 10:00 AM Lloyd Huger, MD [facility] None  10/25/2019 10:30 AM [facility] INJECTION [facility] None  11/29/2019  3:20 PM Amy Santa, MD [facility] None  12/05/2019 11:20 AM Minna Merritts, MD [facility]  12/06/2019  8:30 AM [facility] VASC 2 [facility] None  12/06/2019  9:30 AM Kris Hartmann, NP [facility] None  12/19/2019 10:45 AM Gardiner Barefoot, DPM [facility]  01/09/2020  8:40 AM Rockey Situ, Kathlene November, MD [facility]    Note by: Amy Santa, MD Date: 10/18/2019; Time: 11:37 AM

## 2019-10-18 NOTE — Assessment & Plan Note (Signed)
chief complaint of right arm, right flank, right chest pain that is neuropathic in nature secondary to postherpetic neuralgia.  Patient had shingles outbreak in mid June.  She has developed postherpetic neuralgia that has been refractory to medication management including tricyclic antidepressant: Amitriptyline, gabapentin and escalating dose of 400 mg 4 times daily as needed, oxycodone, hydrocodone.  She states that the pain is very debilitating for her.  She finds it difficult to perform ADLs although she is fairly limited in her functional capacity.   Given that she has failed various neuropathic's, we discussed trial of Lyrica as below.  Risks and benefits reviewed.

## 2019-10-19 ENCOUNTER — Ambulatory Visit: Payer: Medicare Other | Admitting: Cardiovascular Disease

## 2019-10-22 DIAGNOSIS — N189 Chronic kidney disease, unspecified: Secondary | ICD-10-CM | POA: Insufficient documentation

## 2019-10-22 NOTE — Progress Notes (Deleted)
[name]  [phone] [phone]) [phone]  ID: Amy [name] OB: January 05, 1928  MR#: [id number]  OMV#:[id number]  Patient Care Team: [name], MD as PCP - General (Internal Medicine) Rockey Situ Kathlene November, MD as PCP - Cardiology (Cardiology) Minna Merritts, MD as Consulting Physician (Cardiology)  CHIEF COMPLAINT: Anemia secondary to chronic renal insufficiency.  INTERVAL HISTORY: Patient returns to clinic today for repeat laboratory work, further evaluation, and continuation of Retacrit.  She has significant pain in her right upper extremity secondary to recent bout of shingles.  She otherwise feels well.  She continues to have chronic weakness and fatigue.  She has no neurologic complaints.  She denies any chest pain, shortness of breath, cough, or hemoptysis.  She denies any nausea, vomiting, constipation, or diarrhea.  She has no urinary complaints.  Patient feels generally terrible, but offers no further specific complaints.  REVIEW OF SYSTEMS:   Review of Systems  Constitutional: Positive for malaise/fatigue. Negative for fever and weight loss.  HENT: Negative for congestion and sore throat.   Respiratory: Negative.  Negative for cough, hemoptysis, shortness of breath and stridor.   Cardiovascular: Negative.  Negative for chest pain and leg swelling.  Gastrointestinal: Negative for abdominal pain, blood in stool and melena.  Genitourinary: Negative.  Negative for hematuria.  Musculoskeletal: Positive for joint pain and myalgias. Negative for back pain.  Skin: Negative.  Negative for rash.  Neurological: Positive for weakness. Negative for sensory change, focal weakness and headaches.  Psychiatric/Behavioral: Negative.  The patient is not nervous/anxious.     As per HPI. Otherwise, a complete review of systems is negative.  PAST MEDICAL HISTORY: Past Medical History:  Diagnosis Date  . Bladder incontinence   . CAD (coronary artery  disease)    a. 05/2009 Cath: LAD 90p (Xience 2.75 x 12 mm DES), 80m, D1 40, LCx 40p/m, RCA 30/40/30p/m, RCA 30/25d;  b.  12/2011 Lexiscan MV: no ischemia, breast attenuation artifact, normal EF-->Low risk; c. 10/2016 MV: fixed apical defect, most likely apical thinning and attenuation, No ischemia, EF 60%.  . Cancer ([place])    ovarian  . Carotid arterial disease w/ R Carotid Bruit (HCC)    a. 08/2016 Carotid U/S: 40-59% bilat ICA stenosis - f/u 1 yr.  . Chronic diastolic (congestive) heart failure ([place])    a. Echo 09/2015: EF 55-60% w/ Grade 1 DD, sev Ca2+ MV annulus, mildly dil LA; b. 09/2016 Echo: EF 65-70%, Gr2 DD, mildly dil LA/RA, nl RV fxn.  . Chronic Dyspnea on exertion   . CKD (chronic kidney disease) stage 3, GFR 30-59 ml/min   . Degenerative arthritis of knee    bilateral knees  . Diabetes mellitus    Type II  . GIB (gastrointestinal bleeding)    a. 08/2016 Admission w/ presyncope/anemia/melena-->req Transfusion-->endo ok, colonoscopy w/ polyps but no source of bleeding (most likely diverticular).  . Hiatal hernia   . Hypertension   . Iron deficiency   . Menopausal symptoms   . Morbid obesity ([place])   . Renal insufficiency   . Thyroid disease    hypothyroidism    PAST SURGICAL HISTORY: Past Surgical History:  Procedure Laterality Date  . COLONOSCOPY  2015  . COLONOSCOPY WITH PROPOFOL N/A 09/25/2016   Procedure: COLONOSCOPY WITH PROPOFOL;  Surgeon: [name], MD;  Location: [facility] ENDOSCOPY;  Service: Gastroenterology;  Laterality: N/A;  . COLONOSCOPY WITH PROPOFOL N/A 09/26/2016   Procedure: COLONOSCOPY WITH PROPOFOL;  Surgeon: [name], MD;  Location: [place];  Service: Gastroenterology;  Laterality: N/A;  . ESOPHAGOGASTRODUODENOSCOPY (EGD) WITH PROPOFOL N/A 09/25/2016   Procedure: ESOPHAGOGASTRODUODENOSCOPY (EGD) WITH PROPOFOL;  Surgeon: [name], MD;  Location: [facility] ENDOSCOPY;  Service: Gastroenterology;  Laterality: N/A;  . RENAL ANGIOGRAPHY N/A 02/07/2019    Procedure: RENAL ANGIOGRAPHY;  Surgeon: [name], MD;  Location: [place] CV LAB;  Service: Cardiovascular;  Laterality: N/A;  . REPLACEMENT TOTAL KNEE BILATERAL    . TOTAL VAGINAL HYSTERECTOMY     ovarian mass, not cancerous  . UPPER GI ENDOSCOPY  2015    FAMILY HISTORY Family History  Problem Relation Age of Onset  . Breast cancer Mother 41       ADVANCED DIRECTIVES:    HEALTH MAINTENANCE: Social History   Tobacco Use  . Smoking status: Never Smoker  . Smokeless tobacco: Never Used  Vaping Use  . Vaping Use: Never used  Substance Use Topics  . Alcohol use: No  . Drug use: No     Colonoscopy:  PAP:  Bone density:  Lipid panel:  Allergies  Allergen Reactions  . Atenolol Other (See Comments)    Other reaction(s): Other (See Comments) Decreased heart rate Decreased heart rate  . Codeine Shortness Of Breath    Rash, difficulty breathing, nausea.  . Losartan Other (See Comments)    Hyperkalemia  . Morphine And Related Shortness Of Breath    Rash, difficulty breathing, nausea.   . Nsaids Other (See Comments)    Other reaction(s): Unknown  . Rofecoxib     Other reaction(s): Unknown  . Sucralfate Other (See Comments)    Throat tightness  . Sulfa Antibiotics     Other reaction(s): Unknown    Current Outpatient Medications  Medication Sig Dispense Refill  . albuterol (PROVENTIL HFA;VENTOLIN HFA) 108 (90 Base) MCG/ACT inhaler Inhale 2 puffs into the lungs every 6 (six) hours as needed for wheezing or shortness of breath.    Marland Kitchen aspirin EC 81 MG EC tablet Take 1 tablet (81 mg total) by mouth daily. Swallow whole. 30 tablet 11  . Capsaicin 0.035 % CREA Apply to affected region up to 3 times a day 42.5 g 0  . cloNIDine (CATAPRES - DOSED IN MG/24 HR) 0.2 mg/24hr patch Place 1 patch (0.2 mg total) onto the skin once a week. 4 patch 12  . clopidogrel (PLAVIX) 75 MG tablet Take 1 tablet (75 mg total) by mouth daily. 90 tablet 3  . hydrALAZINE (APRESOLINE) 100 MG  tablet Take 100 mg by mouth 3 (three) times daily.     Marland Kitchen levothyroxine (SYNTHROID) 88 MCG tablet 88 mcg daily before breakfast.     . montelukast (SINGULAIR) 10 MG tablet Take 10 mg by mouth at bedtime.     . nitroGLYCERIN (NITROSTAT) 0.4 MG SL tablet Place 1 tablet (0.4 mg total) under the tongue every 5 (five) minutes x 3 doses as needed for chest pain.  12  . oxybutynin (DITROPAN-XL) 5 MG 24 hr tablet Take 5 mg by mouth daily.     Marland Kitchen oxyCODONE-acetaminophen (PERCOCET) 10-325 MG tablet Take 1 tablet by mouth every 8 (eight) hours as needed for pain.    . pantoprazole (PROTONIX) 40 MG tablet Take 20 mg by mouth daily.     . pregabalin (LYRICA) 50 MG capsule Take 1 capsule (50 mg total) by mouth daily for 15 days, THEN 1 capsule (50 mg total) 2 (two) times daily. 75 capsule 0  . simvastatin (ZOCOR) 40 MG tablet Take 1 tablet (40 mg total)  by mouth at bedtime. 90 tablet 2  . SYMBICORT 160-4.5 MCG/ACT inhaler Inhale 2 puffs into the lungs 2 (two) times daily.     Marland Kitchen torsemide (DEMADEX) 20 MG tablet Take 20 mg by mouth 3 (three) times a week.     No current facility-administered medications for this visit.   Facility-Administered Medications Ordered in Other Visits  Medication Dose Route Frequency Provider Last Rate Last Admin  . 0.9 %  sodium chloride infusion   Intravenous Continuous Lloyd Huger, MD      . epoetin alfa-epbx (RETACRIT) injection 40,000 Units  40,000 Units Subcutaneous Once Lloyd Huger, MD        OBJECTIVE: There were no vitals filed for this visit.   There is no height or weight on file to calculate BMI.    ECOG FS:2 - Symptomatic, <50% confined to bed  General: Well-developed, well-nourished, moderate distress secondary to pain.  Sitting in a wheelchair. Eyes: Pink conjunctiva, anicteric sclera. HEENT: Normocephalic, moist mucous membranes. Lungs: No audible wheezing or coughing. Heart: Regular rate and rhythm. Abdomen: Soft, nontender, no obvious  distention. Musculoskeletal: No edema, cyanosis, or clubbing. Neuro: Alert, answering all questions appropriately. Cranial nerves grossly intact. Skin: No rashes or petechiae noted. Psych: Normal affect.   LAB RESULTS:  Lab Results  Component Value Date   NA 138 08/23/2019   K 5.3 (H) 08/23/2019   CL 113 (H) 08/23/2019   CO2 20 (L) 08/23/2019   GLUCOSE 111 (H) 08/23/2019   BUN 58 (H) 08/23/2019   CREATININE 1.84 (H) 08/23/2019   CALCIUM 8.4 (L) 08/23/2019   PROT 5.2 (L) 08/12/2019   ALBUMIN 2.9 (L) 08/12/2019   AST 14 (L) 08/12/2019   ALT 11 08/12/2019   ALKPHOS 58 08/12/2019   BILITOT 0.7 08/12/2019   GFRNONAA 24 (L) 08/23/2019   GFRAA 27 (L) 08/23/2019    Lab Results  Component Value Date   WBC 9.6 09/27/2019   NEUTROABS 5.9 09/27/2019   HGB 8.8 (L) 09/27/2019   HCT 27.7 (L) 09/27/2019   MCV 104.1 (H) 09/27/2019   PLT 269 09/27/2019   Lab Results  Component Value Date   IRON 41 09/27/2019   TIBC 146 (L) 09/27/2019   IRONPCTSAT 28 09/27/2019    Lab Results  Component Value Date   FERRITIN 308 (H) 09/27/2019    STUDIES: No results found.  ASSESSMENT: Anemia secondary to chronic renal insufficiency.  PLAN:    1.  Anemia secondary to chronic renal insufficiency: Patient's iron stores are now within normal limits.  Her hemoglobin remains decreased at 8.8, but continues to trend up. She does not require additional IV iron today.  She last received treatment with Feraheme on June 22, 2019.  Proceed with 40,000 units Retacrit today.  Return to clinic in 4 weeks with repeat laboratory work, further evaluation, and continuation of treatment if needed. 2. Pulmonary nodules: Repeat CT scan from January 13, 2017 revealed stable pulmonary nodules.  No further imaging is necessary.   3. Renal insufficiency: Chronic and unchanged.  Continue follow-up with nephrology as scheduled. 4.  Hypertension: Chronic and unchanged.  Proceed with Retacrit as scheduled.  Continue  follow-up with primary care. 5.  History of GI bleed: Colonoscopy and EGD in August 2018.  Continue follow-up with GI as indicated. 6.  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  Patient received treatment with antivirals approximately 2 months ago and rash is essentially resolved.  She has been instructed to continue gabapentin and was given a  prescription for Vicodin today.   Patient expressed understanding and was in agreement with this plan. She also understands that She can call clinic at any time with any questions, concerns, or complaints.    Lloyd Huger, MD   10/22/2019 10:13 AM

## 2019-10-25 ENCOUNTER — Inpatient Hospital Stay: Payer: Medicare Other

## 2019-10-25 ENCOUNTER — Inpatient Hospital Stay: Payer: Medicare Other | Admitting: Oncology

## 2019-10-26 ENCOUNTER — Telehealth: Payer: Self-pay | Admitting: *Deleted

## 2019-10-26 NOTE — Telephone Encounter (Signed)
[name] advised ok to go ahead and increase Lyrica to 2 times per day, per instructions on the script.

## 2019-11-06 NOTE — Progress Notes (Signed)
[place]  Telephone:(336) [phone] [phone]) [phone]  ID: Amy [name] OB: [birth year]  MR#: [id number]  SWF#:[id number]  Patient Care Team: [name], MD as PCP - General (Internal Medicine) Rockey Situ Kathlene November, MD as PCP - Cardiology (Cardiology) Minna Merritts, MD as Consulting Physician (Cardiology)  CHIEF COMPLAINT: Anemia secondary to chronic renal insufficiency.  INTERVAL HISTORY: Patient returns to clinic today for repeat laboratory work, further evaluation, and continuation of treatment.  She continues to have residual pain from her shingles, but otherwise feels well.  She continues to have chronic weakness and fatigue.  She has no neurologic complaints. She denies any chest pain, shortness of breath, cough, or hemoptysis.  She denies any nausea, vomiting, constipation, or diarrhea.  She has no urinary complaints.  Patient offers no further specific complaints today.  REVIEW OF SYSTEMS:   Review of Systems  Constitutional: Positive for malaise/fatigue. Negative for fever and weight loss.  HENT: Negative for congestion and sore throat.   Respiratory: Negative.  Negative for cough, hemoptysis, shortness of breath and stridor.   Cardiovascular: Negative.  Negative for chest pain and leg swelling.  Gastrointestinal: Negative for abdominal pain, blood in stool and melena.  Genitourinary: Negative.  Negative for hematuria.  Musculoskeletal: Positive for myalgias. Negative for back pain and joint pain.  Skin: Negative.  Negative for rash.  Neurological: Positive for weakness. Negative for sensory change, focal weakness and headaches.  Psychiatric/Behavioral: Negative.  The patient is not nervous/anxious.     As per HPI. Otherwise, a complete review of systems is negative.  PAST MEDICAL HISTORY: Past Medical History:  Diagnosis Date  . Bladder incontinence   . CAD (coronary artery disease)    a. 05/2009 Cath: LAD 90p (Xience 2.75 x 12 mm DES),  44m, D1 40, LCx 40p/m, RCA 30/40/30p/m, RCA 30/25d;  b.  12/2011 Lexiscan MV: no ischemia, breast attenuation artifact, normal EF-->Low risk; c. 10/2016 MV: fixed apical defect, most likely apical thinning and attenuation, No ischemia, EF 60%.  . Cancer ([place])    ovarian  . Carotid arterial disease w/ R Carotid Bruit (HCC)    a. 08/2016 Carotid U/S: 40-59% bilat ICA stenosis - f/u 1 yr.  . Chronic diastolic (congestive) heart failure ([place])    a. Echo 09/2015: EF 55-60% w/ Grade 1 DD, sev Ca2+ MV annulus, mildly dil LA; b. 09/2016 Echo: EF 65-70%, Gr2 DD, mildly dil LA/RA, nl RV fxn.  . Chronic Dyspnea on exertion   . CKD (chronic kidney disease) stage 3, GFR 30-59 ml/min   . Degenerative arthritis of knee    bilateral knees  . Diabetes mellitus    Type II  . GIB (gastrointestinal bleeding)    a. 08/2016 Admission w/ presyncope/anemia/melena-->req Transfusion-->endo ok, colonoscopy w/ polyps but no source of bleeding (most likely diverticular).  . Hiatal hernia   . Hypertension   . Iron deficiency   . Menopausal symptoms   . Morbid obesity ([place])   . Renal insufficiency   . Thyroid disease    hypothyroidism    PAST SURGICAL HISTORY: Past Surgical History:  Procedure Laterality Date  . COLONOSCOPY  2015  . COLONOSCOPY WITH PROPOFOL N/A 09/25/2016   Procedure: COLONOSCOPY WITH PROPOFOL;  Surgeon: [name], MD;  Location: [facility] ENDOSCOPY;  Service: Gastroenterology;  Laterality: N/A;  . COLONOSCOPY WITH PROPOFOL N/A 09/26/2016   Procedure: COLONOSCOPY WITH PROPOFOL;  Surgeon: [name], MD;  Location: [facility] ENDOSCOPY;  Service: Gastroenterology;  Laterality: N/A;  . ESOPHAGOGASTRODUODENOSCOPY (EGD) WITH  PROPOFOL N/A 09/25/2016   Procedure: ESOPHAGOGASTRODUODENOSCOPY (EGD) WITH PROPOFOL;  Surgeon: [name], MD;  Location: [facility] ENDOSCOPY;  Service: Gastroenterology;  Laterality: N/A;  . RENAL ANGIOGRAPHY N/A 02/07/2019   Procedure: RENAL ANGIOGRAPHY;  Surgeon: [name], MD;  Location:  [place] CV LAB;  Service: Cardiovascular;  Laterality: N/A;  . REPLACEMENT TOTAL KNEE BILATERAL    . TOTAL VAGINAL HYSTERECTOMY     ovarian mass, not cancerous  . UPPER GI ENDOSCOPY  2015    FAMILY HISTORY Family History  Problem Relation Age of Onset  . Breast cancer Mother 38       ADVANCED DIRECTIVES:    HEALTH MAINTENANCE: Social History   Tobacco Use  . Smoking status: Never Smoker  . Smokeless tobacco: Never Used  Vaping Use  . Vaping Use: Never used  Substance Use Topics  . Alcohol use: No  . Drug use: No     Colonoscopy:  PAP:  Bone density:  Lipid panel:  Allergies  Allergen Reactions  . Atenolol Other (See Comments)    Other reaction(s): Other (See Comments) Decreased heart rate Decreased heart rate  . Codeine Shortness Of Breath    Rash, difficulty breathing, nausea.  . Losartan Other (See Comments)    Hyperkalemia  . Morphine And Related Shortness Of Breath    Rash, difficulty breathing, nausea.   . Nsaids Other (See Comments)    Other reaction(s): Unknown  . Rofecoxib     Other reaction(s): Unknown  . Sucralfate Other (See Comments)    Throat tightness  . Sulfa Antibiotics     Other reaction(s): Unknown    Current Outpatient Medications  Medication Sig Dispense Refill  . albuterol (PROVENTIL HFA;VENTOLIN HFA) 108 (90 Base) MCG/ACT inhaler Inhale 2 puffs into the lungs every 6 (six) hours as needed for wheezing or shortness of breath.    Marland Kitchen aspirin EC 81 MG EC tablet Take 1 tablet (81 mg total) by mouth daily. Swallow whole. 30 tablet 11  . Capsaicin 0.035 % CREA Apply to affected region up to 3 times a day 42.5 g 0  . cloNIDine (CATAPRES - DOSED IN MG/24 HR) 0.2 mg/24hr patch Place 1 patch (0.2 mg total) onto the skin once a week. 4 patch 12  . clopidogrel (PLAVIX) 75 MG tablet Take 1 tablet (75 mg total) by mouth daily. 90 tablet 3  . hydrALAZINE (APRESOLINE) 100 MG tablet Take 100 mg by mouth 3 (three) times daily.     Marland Kitchen  levothyroxine (SYNTHROID) 88 MCG tablet 88 mcg daily before breakfast.     . montelukast (SINGULAIR) 10 MG tablet Take 10 mg by mouth at bedtime.     . nitroGLYCERIN (NITROSTAT) 0.4 MG SL tablet Place 1 tablet (0.4 mg total) under the tongue every 5 (five) minutes x 3 doses as needed for chest pain.  12  . oxybutynin (DITROPAN-XL) 5 MG 24 hr tablet Take 5 mg by mouth daily.     Marland Kitchen oxyCODONE-acetaminophen (PERCOCET) 10-325 MG tablet Take 1 tablet by mouth every 8 (eight) hours as needed for pain.    . pantoprazole (PROTONIX) 40 MG tablet Take 20 mg by mouth daily.     . pregabalin (LYRICA) 50 MG capsule Take 1 capsule (50 mg total) by mouth daily for 15 days, THEN 1 capsule (50 mg total) 2 (two) times daily. 75 capsule 0  . simvastatin (ZOCOR) 40 MG tablet Take 1 tablet (40 mg total) by mouth at bedtime. 90 tablet 2  . SYMBICORT  160-4.5 MCG/ACT inhaler Inhale 2 puffs into the lungs 2 (two) times daily.     Marland Kitchen torsemide (DEMADEX) 20 MG tablet Take 20 mg by mouth 3 (three) times a week.     No current facility-administered medications for this visit.   Facility-Administered Medications Ordered in Other Visits  Medication Dose Route Frequency Provider Last Rate Last Admin  . 0.9 %  sodium chloride infusion   Intravenous Continuous Lloyd Huger, MD      . epoetin alfa-epbx (RETACRIT) injection 40,000 Units  40,000 Units Subcutaneous Once Lloyd Huger, MD        OBJECTIVE: Vitals:   11/09/19 1043  BP: (!) 129/59  Pulse: 65  Resp: 18  Temp: (!) 97.5 F (36.4 C)  SpO2: 98%     Body mass index is 27.44 kg/m.    ECOG FS:2 - Symptomatic, <50% confined to bed  General: Well-developed, well-nourished, no acute distress.  Sitting in a wheelchair. Eyes: Pink conjunctiva, anicteric sclera. HEENT: Normocephalic, moist mucous membranes. Lungs: No audible wheezing or coughing. Heart: Regular rate and rhythm. Abdomen: Soft, nontender, no obvious distention. Musculoskeletal: No edema,  cyanosis, or clubbing. Neuro: Alert, answering all questions appropriately. Cranial nerves grossly intact. Skin: No rashes or petechiae noted. Psych: Normal affect.    LAB RESULTS:  Lab Results  Component Value Date   NA 138 08/23/2019   K 5.3 (H) 08/23/2019   CL 113 (H) 08/23/2019   CO2 20 (L) 08/23/2019   GLUCOSE 111 (H) 08/23/2019   BUN 58 (H) 08/23/2019   CREATININE 1.84 (H) 08/23/2019   CALCIUM 8.4 (L) 08/23/2019   PROT 5.2 (L) 08/12/2019   ALBUMIN 2.9 (L) 08/12/2019   AST 14 (L) 08/12/2019   ALT 11 08/12/2019   ALKPHOS 58 08/12/2019   BILITOT 0.7 08/12/2019   GFRNONAA 24 (L) 08/23/2019   GFRAA 27 (L) 08/23/2019    Lab Results  Component Value Date   WBC 6.3 11/09/2019   NEUTROABS 3.7 11/09/2019   HGB 10.2 (L) 11/09/2019   HCT 31.2 (L) 11/09/2019   MCV 96.3 11/09/2019   PLT 165 11/09/2019   Lab Results  Component Value Date   IRON 55 11/09/2019   TIBC 133 (L) 11/09/2019   IRONPCTSAT 41 (H) 11/09/2019    Lab Results  Component Value Date   FERRITIN 328 (H) 11/09/2019    STUDIES: No results found.  ASSESSMENT: Anemia secondary to chronic renal insufficiency.  PLAN:    1.  Anemia secondary to chronic renal insufficiency: Patient's iron stores continue to be within normal limits.  Her hemoglobin has increased and is now 10.2.  She does not require additional IV iron or Retacrit today.  She last received treatment with Feraheme on June 22, 2019.  Return to clinic in 1 month for laboratory work and Retacrit if her hemoglobin falls below 10.0.  Patient will then return to clinic in 2 months with repeat laboratory work, further evaluation, and continuation of treatment if needed.   2. Pulmonary nodules: Repeat CT scan from January 13, 2017 revealed stable pulmonary nodules.  No further imaging is necessary.   3. Renal insufficiency: Chronic and unchanged.  Continue follow-up with nephrology as scheduled. 4.  Hypertension: Blood pressure is within normal  limits today.  Continue follow-up with primary care. 5.  History of GI bleed: Colonoscopy and EGD in August 2018.  Continue follow-up with GI as indicated. 6.  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  Continue treatment per primary care.  Patient expressed  understanding and was in agreement with this plan. She also understands that She can call clinic at any time with any questions, concerns, or complaints.    Lloyd Huger, MD   11/09/2019 12:24 PM

## 2019-11-09 ENCOUNTER — Inpatient Hospital Stay: Payer: Medicare Other

## 2019-11-09 ENCOUNTER — Inpatient Hospital Stay: Payer: Medicare Other | Attending: Oncology | Admitting: Oncology

## 2019-11-09 ENCOUNTER — Encounter: Payer: Self-pay | Admitting: Oncology

## 2019-11-09 ENCOUNTER — Other Ambulatory Visit: Payer: Self-pay

## 2019-11-09 VITALS — BP 129/59 | HR 65 | Temp 97.5°F | Resp 18 | Wt 150.0 lb

## 2019-11-09 DIAGNOSIS — I701 Atherosclerosis of renal artery: Secondary | ICD-10-CM

## 2019-11-09 DIAGNOSIS — N189 Chronic kidney disease, unspecified: Secondary | ICD-10-CM

## 2019-11-09 DIAGNOSIS — D649 Anemia, unspecified: Secondary | ICD-10-CM

## 2019-11-09 DIAGNOSIS — Z79899 Other long term (current) drug therapy: Secondary | ICD-10-CM | POA: Insufficient documentation

## 2019-11-09 DIAGNOSIS — D631 Anemia in chronic kidney disease: Secondary | ICD-10-CM

## 2019-11-09 DIAGNOSIS — E119 Type 2 diabetes mellitus without complications: Secondary | ICD-10-CM | POA: Insufficient documentation

## 2019-11-09 DIAGNOSIS — I2510 Atherosclerotic heart disease of native coronary artery without angina pectoris: Secondary | ICD-10-CM | POA: Diagnosis not present

## 2019-11-09 DIAGNOSIS — N183 Chronic kidney disease, stage 3 unspecified: Secondary | ICD-10-CM | POA: Diagnosis not present

## 2019-11-09 DIAGNOSIS — G629 Polyneuropathy, unspecified: Secondary | ICD-10-CM | POA: Insufficient documentation

## 2019-11-09 DIAGNOSIS — Z7982 Long term (current) use of aspirin: Secondary | ICD-10-CM | POA: Insufficient documentation

## 2019-11-09 DIAGNOSIS — Z7951 Long term (current) use of inhaled steroids: Secondary | ICD-10-CM | POA: Diagnosis not present

## 2019-11-09 DIAGNOSIS — Z96653 Presence of artificial knee joint, bilateral: Secondary | ICD-10-CM | POA: Diagnosis not present

## 2019-11-09 DIAGNOSIS — I5032 Chronic diastolic (congestive) heart failure: Secondary | ICD-10-CM | POA: Diagnosis not present

## 2019-11-09 DIAGNOSIS — E039 Hypothyroidism, unspecified: Secondary | ICD-10-CM | POA: Insufficient documentation

## 2019-11-09 DIAGNOSIS — R918 Other nonspecific abnormal finding of lung field: Secondary | ICD-10-CM | POA: Insufficient documentation

## 2019-11-09 DIAGNOSIS — I110 Hypertensive heart disease with heart failure: Secondary | ICD-10-CM | POA: Diagnosis not present

## 2019-11-09 LAB — CBC WITH DIFFERENTIAL/PLATELET
Abs Immature Granulocytes: 0.03 10*3/uL (ref 0.00–0.07)
Basophils Absolute: 0 10*3/uL (ref 0.0–0.1)
Basophils Relative: 1 %
Eosinophils Absolute: 0.4 10*3/uL (ref 0.0–0.5)
Eosinophils Relative: 7 %
HCT: 31.2 % — ABNORMAL LOW (ref 36.0–46.0)
Hemoglobin: 10.2 g/dL — ABNORMAL LOW (ref 12.0–15.0)
Immature Granulocytes: 1 %
Lymphocytes Relative: 23 %
Lymphs Abs: 1.5 10*3/uL (ref 0.7–4.0)
MCH: 31.5 pg (ref 26.0–34.0)
MCHC: 32.7 g/dL (ref 30.0–36.0)
MCV: 96.3 fL (ref 80.0–100.0)
Monocytes Absolute: 0.6 10*3/uL (ref 0.1–1.0)
Monocytes Relative: 10 %
Neutro Abs: 3.7 10*3/uL (ref 1.7–7.7)
Neutrophils Relative %: 58 %
Platelets: 165 10*3/uL (ref 150–400)
RBC: 3.24 MIL/uL — ABNORMAL LOW (ref 3.87–5.11)
RDW: 14.3 % (ref 11.5–15.5)
WBC: 6.3 10*3/uL (ref 4.0–10.5)
nRBC: 0 % (ref 0.0–0.2)

## 2019-11-09 LAB — IRON AND TIBC
Iron: 55 ug/dL (ref 28–170)
Saturation Ratios: 41 % — ABNORMAL HIGH (ref 10.4–31.8)
TIBC: 133 ug/dL — ABNORMAL LOW (ref 250–450)
UIBC: 78 ug/dL

## 2019-11-09 LAB — FERRITIN: Ferritin: 328 ng/mL — ABNORMAL HIGH (ref 11–307)

## 2019-11-09 NOTE — Progress Notes (Signed)
Patient here today for follow up regarding anemia. Patient reports ongoing pain related to shingles.

## 2019-11-14 ENCOUNTER — Telehealth: Payer: Self-pay

## 2019-11-14 NOTE — Telephone Encounter (Signed)
Palliative care SW scheduled in home visit with patient for [facility] 11-18-2019 @ 0930

## 2019-11-18 ENCOUNTER — Other Ambulatory Visit: Payer: Medicare Other

## 2019-11-18 ENCOUNTER — Other Ambulatory Visit: Payer: Self-pay

## 2019-11-18 DIAGNOSIS — Z515 Encounter for palliative care: Secondary | ICD-10-CM

## 2019-11-18 NOTE — Progress Notes (Signed)
COMMUNITY PALLIATIVE CARE SW NOTE  PATIENT NAME: Amy [name] DOB: 1928/01/14 MRN: [id number]  PRIMARY CARE PROVIDER: [name], MD  RESPONSIBLE PARTY:  Acct ID - Guarantor Home Phone Work Phone Relationship Acct Type  [id number] Amy [name],* [phone]  Self P/F     [address], [place], [place] [address]     PLAN OF CARE and INTERVENTIONS:             Patient is a DNR. MOST form completed as well. Patient goal is to remain in her home as independent as possible.  2. SOCIAL/EMOTIONAL/SPIRITUAL ASSESSMENT/ INTERVENTIONS:  SW and RN Amy [name] visited with patient in her home, son Amy [name] present as well. Patients resides in a small one story home behind granddaughters home. Son shared that he comes every morning to assist patient and patient's daughter comes every evening. Patient's granddaughter prepares her pill box every week. Patient states she is eating well, no loss in appetite. Patient sleeping okay. Patient states that she is still in pain from shingles, patient takes oxycodone daily for pain, and is low - RN called in re-fill to PCP. RN reviewed meds with patient. Vitals 150/52, 97%, heart rate 57. Patient has CKD. SW discussed goals, reviewed care plan, provided emotional support, used active and reflective listening. 3. PATIENT/CAREGIVER EDUCATION/ COPING: Patient was alert, engaged with SW. Patient was pleasant. Patient is open with expressing feelings. Patient enjoys reading the books from [employer], and enjoys time outdoors with family. Family is supportive, daughter brings patient's groceries and runs errands. Patient's son helps clean her home. 4. PERSONAL EMERGENCY PLAN:  Patient has [place] and family will call 9-1-1 for emergencies 5. COMMUNITY RESOURCES COORDINATION/ HEALTH CARE NAVIGATION:  Patient coordinates her care. Daughter and son assists with transportation to appointments. 6. FINANCIAL/LEGAL CONCERNS/INTERVENTIONS:  None     SOCIAL HX:  Social  History   Tobacco Use  . Smoking status: Never Smoker  . Smokeless tobacco: Never Used  Substance Use Topics  . Alcohol use: No    CODE STATUS: DNR  ADVANCED DIRECTIVES: Y MOST FORM COMPLETE: Y HOSPICE EDUCATION PROVIDED: N  PPS: Patient ambulatory with RW, able to bathe, toilet, feed self, and dress independently. Son available to assist with ADL's as needed. Family preps meals.       Amy [name], [place]

## 2019-11-24 ENCOUNTER — Telehealth: Payer: Self-pay | Admitting: Student in an Organized Health Care Education/Training Program

## 2019-11-24 MED ORDER — PREGABALIN 50 MG PO CAPS: 50.0000 mg | ORAL_CAPSULE | Freq: Two times a day (BID) | ORAL | 2 refills | Status: DC

## 2019-11-24 NOTE — Telephone Encounter (Signed)
Patient will be out of her pregablin 50mg  tomorrow per her daughter. She increased her to taking BID and will not have enough until her appt on October 5th. Daughter states she will keep her appt but was wanting to know if you could send in a refill for her.    She is now taking Prebalin 50mg  Bid.  Advise please, I will be glad to call daughter back.

## 2019-11-24 NOTE — Telephone Encounter (Signed)
Merton Border (daughter) called stating patient will not have enough meds (gapabentin)  to last until Tue 10-5, which is her appt. States they spoke with nurse and was told they could start taking 2nd tablet sooner than was scheduled and now she is going to run out of gabapentin. Please call patient and advise. Thank you

## 2019-11-29 ENCOUNTER — Other Ambulatory Visit: Payer: Self-pay

## 2019-11-29 ENCOUNTER — Encounter: Payer: Self-pay | Admitting: Student in an Organized Health Care Education/Training Program

## 2019-11-29 ENCOUNTER — Ambulatory Visit
Payer: Medicare Other | Attending: Student in an Organized Health Care Education/Training Program | Admitting: Student in an Organized Health Care Education/Training Program

## 2019-11-29 DIAGNOSIS — G894 Chronic pain syndrome: Secondary | ICD-10-CM | POA: Diagnosis present

## 2019-11-29 DIAGNOSIS — M792 Neuralgia and neuritis, unspecified: Secondary | ICD-10-CM | POA: Diagnosis present

## 2019-11-29 DIAGNOSIS — B0229 Other postherpetic nervous system involvement: Secondary | ICD-10-CM | POA: Insufficient documentation

## 2019-11-29 MED ORDER — PREGABALIN 50 MG PO CAPS: ORAL_CAPSULE | ORAL | 3 refills | Status: DC

## 2019-11-29 NOTE — Progress Notes (Signed)
Patient: Amy [name]  Service Category: E/M  Provider: Gillis Santa, MD  DOB: 1928-01-08  DOS: 11/29/2019  Location: Office  MRN: [id number]  Setting: Ambulatory outpatient  Referring Provider: [name], MD  Type: Established Patient  Specialty: Interventional Pain Management  PCP: [name], MD  Location: Home  Delivery: TeleHealth     Virtual Encounter - Pain Management PROVIDER NOTE: Information contained herein reflects review and annotations entered in association with encounter. Interpretation of such information and data should be left to medically-trained personnel. Information provided to patient can be located elsewhere in the medical record under "Patient Instructions". Document created using STT-dictation technology, any transcriptional errors that may result from process are unintentional.    Contact & Pharmacy Preferred: [phone] Home: [phone] (home) Mobile: [phone] (mobile) E-mail: [email or web address]  [facility] - Amy [name], [name] [address] Phone: [phone] Fax: [place], [name] - [name] [address] Phone: [phone] Fax: [phone]  [name] [id number]: [phone]: [phone]  Pre-screening  Amy [name] offered "in-person" vs "virtual" encounter. She indicated preferring virtual for this encounter.   Reason COVID-19*  Social distancing based on CDC and AMA recommendations.   I contacted Amy [name] on 11/29/2019 via video conference.      I clearly identified myself as BGillis Santa MD. I verified that I was speaking with the correct person using two identifiers (Name: Amy [name] and date of birth: [birth year].  Consent I sought verbal advanced consent from Amy [name] for virtual visit interactions. I informed Amy [name] of possible security and privacy concerns, risks, and limitations associated with providing "not-in-person" medical evaluation and management services. I also informed Amy [name] of the availability of "in-person" appointments. Finally, I informed her that there would be a charge for the virtual visit and that she could be  personally, fully or partially, financially responsible for it. Amy [name] understanding and agreed to proceed.   Historic Elements   Ms. Jazzlyn CCORTINA VULTAGGIOis a 86y.o. year old, female patient evaluated today after our last contact on 11/24/2019. Amy [name] has a past medical history of Bladder incontinence, CAD (coronary artery disease), Cancer ([place], Carotid arterial disease w/ R Carotid Bruit ([place], Chronic diastolic (congestive) heart failure (HCC), Chronic Dyspnea on exertion, CKD (chronic kidney disease) stage 3, GFR 30-59 ml/min, Degenerative arthritis of knee, Diabetes mellitus, GIB (gastrointestinal bleeding), Hiatal hernia, Hypertension, Iron deficiency, Menopausal symptoms, Morbid obesity ([place], Renal insufficiency, and Thyroid disease. She also  has a past surgical history that includes Replacement total knee bilateral; Total vaginal hysterectomy; Upper gi endoscopy (2015); Colonoscopy (2015); Esophagogastroduodenoscopy (egd) with propofol (N/A, 09/25/2016); Colonoscopy with propofol (N/A, 09/25/2016); Colonoscopy with propofol (N/A, 09/26/2016); and RENAL ANGIOGRAPHY (N/A, 02/07/2019). Amy [name] a current medication list which includes the following prescription(s): albuterol, aspirin, capsaicin, clonidine, clopidogrel, hydralazine, levothyroxine, montelukast, nitroglycerin, oxybutynin, oxycodone-acetaminophen, pantoprazole, pregabalin, simvastatin, symbicort, and torsemide, and the following Facility-Administered Medications: sodium chloride and epoetin alfa-epbx. She  reports that she  has never smoked. She has never used smokeless tobacco. She reports that she does not drink alcohol and does not use drugs. [name] to atenolol, codeine, losartan, morphine and related, nsaids, rofecoxib, sucralfate, and sulfa antibiotics.   HPI  Today, she is being contacted for medication management.  From 8/24: Postherpetic neuralgia chief complaint of right arm, right flank, right chest pain that is neuropathic in nature secondary to postherpetic neuralgia.  Patient had shingles outbreak in mid June.  She has developed postherpetic neuralgia that has been refractory to medication management including tricyclic antidepressant: Amitriptyline, gabapentin and escalating dose of 400 mg 4 times daily as needed, oxycodone, hydrocodone.  She states that the pain is very debilitating for her.  She finds it difficult to perform ADLs although she is fairly limited in her functional capacity.   Given that she has failed various neuropathic's, we discussed trial of Lyrica as below.  Risks and benefits reviewed.  10/5: Spoke with patient and her daughter.  She states that she is noticing pain relief with Lyrica although she had a tough time with her pain as she was transitioning off the gabapentin and starting Lyrica.  No side effects of sedation.  She does have lower extremity edema at baseline but per her daughter, this has not worsened since titrating Lyrica to 50 mg twice a day.  She continues amitriptyline as prescribed.  Patient has chronic kidney disease.  Her Lyrica should be renally dosed and according to her GFR, maximum daily dose is 150 mg.  Recommend patient increase to 100 mg nightly, continue 50 mg during the day.  Laboratory Chemistry Profile   Renal Lab Results  Component Value Date   BUN 58 (H) 08/23/2019   CREATININE 1.84 (H) 08/23/2019   BCR 24 03/15/2019   GFRAA 27 (L) 08/23/2019   GFRNONAA 24 (L) 08/23/2019     Hepatic Lab Results  Component Value Date    AST 14 (L) 08/12/2019   ALT 11 08/12/2019   ALBUMIN 2.9 (L) 08/12/2019   ALKPHOS 58 08/12/2019   LIPASE 21 08/06/2019   AMMONIA <9 (L) 08/13/2019     Electrolytes Lab Results  Component Value Date   NA 138 08/23/2019   K 5.3 (H) 08/23/2019   CL 113 (H) 08/23/2019   CALCIUM 8.4 (L) 08/23/2019   MG 1.7 08/23/2019     Bone Lab Results  Component Value Date   VD25OH 6.3 (L) 10/22/2015     Inflammation (CRP: Acute Phase) (ESR: Chronic Phase) Lab Results  Component Value Date   LATICACIDVEN 1.4 08/12/2019       Note: Above Lab results reviewed.  Imaging  LONG TERM MONITOR-LIVE TELEMETRY (3-14 DAYS) Event monitor  Normal sinus rhythm avg HR of 66 bpm.  7 Supraventricular Tachycardia/atrial tachycardia runs occurred, the run  with the fastest interval lasting 3 beats with a max rate of 143 bpm, the  longest lasting 14 beats with an avg rate of 97 bpm.  Second Degree AV Block-Mobitz I (Wenckebach) was present.   Isolated SVEs were rare (<1.0%), SVE Couplets were rare (<1.0%), and SVE  Triplets were rare (<1.0%). Isolated VEs were rare (<1.0%), VE Couplets were rare (<1.0%), and no VE Triplets were  present. Ventricular Trigeminy was present.  No patient triggered events noted  Signed, Esmond Plants, MD, Ph.D [facility]  Assessment  The primary encounter diagnosis was Postherpetic neuralgia. Diagnoses of Neuropathic pain of chest and Chronic pain syndrome were also pertinent to this visit.  Plan of Care  Problem-specific:  Postherpetic neuralgia Finding benefit with Lyrica.  Titrate to 150 mg as below, renally dose based upon her GFR of 25.  Amy [name] has a current medication list which includes the following long-term medication(s): albuterol, hydralazine, montelukast, nitroglycerin, pregabalin, simvastatin, symbicort, and torsemide.  Pharmacotherapy (Medications Ordered): Meds ordered this encounter  Medications  . pregabalin (LYRICA) 50 MG  capsule    Sig: 50 mg during the day, 100 mg qhs    Dispense:  90 capsule    Refill:  3    Fill one day early if pharmacy is closed on scheduled refill date. May substitute for generic if available.    Follow-up plan:   Return in about 4 months (around 03/31/2020) for Medication Management, virtual.   Recent Visits Date Type Provider Dept  10/18/19 Office Visit Gillis Santa, MD [facility]  Showing recent visits within past 90 days and meeting all other requirements Future Appointments No visits were found meeting these conditions. Showing future appointments within next 90 days and meeting all other requirements  I discussed the assessment and treatment plan with the patient. The patient was provided an opportunity to ask questions and all were answered. The patient agreed with the plan and demonstrated an understanding of the instructions.  Patient advised to call back or seek an in-person evaluation if the symptoms or condition worsens.  Duration of encounter: 17mnutes.  Note by: BGillis Santa MD Date: 11/29/2019; Time: 3:31 PM

## 2019-11-29 NOTE — Assessment & Plan Note (Signed)
Finding benefit with Lyrica.  Titrate to 150 mg as below, renally dose based upon her GFR of 25.

## 2019-12-01 ENCOUNTER — Telehealth: Payer: Self-pay | Admitting: Cardiovascular Disease

## 2019-12-01 NOTE — Telephone Encounter (Signed)
Patient daughter called and states patient has fallen twice in the last week. States patient has an appointment tomorrow with Dr. Rockey Situ and is not sure patient will let Dr. Rockey Situ aware of this. States patient refused to go to the ED, states she is not sure if patient had any pain when she fell. States patient has had shingles since the end of May.

## 2019-12-01 NOTE — Telephone Encounter (Signed)
Spoke with patients daughter per release form and she states that her brother is coming with her to appointment. She states that he is not familiar with her medications at all but stated she will send a updated list with her when she comes in for visit.  She has shingles since May. She has had 2 falls in the last week and she has had 2 or 3 falls this summer. One was in [place] at [facility]. They are concerned with the falls because she refuses to call the provider or go for evaluation at the ED. She is aware her mother will not tell Dr. Rockey Situ so she is calling so he will know. She states that provider can call her if needed during the visit. She verbalized understanding of our conversation and no further questions at this time.

## 2019-12-03 NOTE — Progress Notes (Signed)
Cardiology Office Note  Date:  12/05/2019   ID:  Amy [name], DOB 1928/02/07, MRN [id number]  PCP:  [name], MD   Chief Complaint  Patient presents with  . Other    Hospital follow up  - Patient c.o SOB and swelling in left leg. Meds reviewed verbally with patient.     HPI:  Amy [name] is a pleasant 86 y.o. woman with a PMHx of:  Coronary Artery Disease  Proximal LAD with stent  Incontinence  COPD exacerbations Chronic baseline shortness of breath Morbid obesity due to long history of poor diet Beta blocker has been held in the past secondary to bradycardia.  Off losartan secondary to hyperkalemia, was on HCTZ who now presents for routine follow up of her Coronary Artery Disease, leg weakness  In the hospital June 2021 Weakness, loss of consciousness, Head pain with shingles at the time Cardiology consulted recommending a monitor Troponin 733 felt to be demand ischemia  Some recent falls, lives at home alone Children live in the area Son is staying with her, he is retired Legs give out, has a fall "they quit PT", 3x a week from covid Getting weaker in the past year "I just give out"  Torsemide 3x a week trace edema, not moving much, sits most of the day Has a recliner  Labs reviewed HGB 10 CR 1.77 in 10/2019, through nephrology  Trace leg edema L>R, unchanged from prior visit  EKG personally reviewed by myself on todays visit  shows normal sinus rhythm RBBB rate 71 beats per minute, unchanged  OTHER PAST MEDICAL HISTORY REVIEWED BY ME AT TODAY'S VISIT:  DATE TEST EF   09/2016 Echo  65-70 %   09/2015 Echo  55-60 %   02/2015 Echo 60-65% mild AS, LVH   2019 hospitalization March  2019 Sepsis due to pneumonia, upper GI bleed melena, acute on chronic diastolic CHF progressive shortness of breath with melena on arrival, pneumonia, treated with antibiotics pleural effusion felt secondary to CHF Asa plavix held prbc x1 HCT 25.7 D/c on  plavix with no asa  Stress test 03/2017 Pharmacological myocardial perfusion imaging study with no significant Ischemia Small region of fixed apical thinning, consistent with attenuation artifact GI uptake artifact noted Normal wall motion, EF estimated at 56% No EKG changes concerning for ischemia at peak stress or in recovery. Low risk scan  2017 hospitalization mid August 2017 COPD exacerbation, required steroids, DuoNeb's, flutter device, antibiotics CT scan showed mucus plugging, postobstructive atelectasis  2015  [facility] August 2015. She had diarrhea, vomiting, felt to be viral also with acute renal failure that improved with hydration. Creatinine reached 1.86, one month later creatinine 1.0 as an outpatient Continues to be weak, does not walk much and is deconditioned  Guaiac positive and she had colonoscopy and EGD. She reports Dr. Vira Agar did not find anything Iron transfusions in the past under the direction of Dr. Ma Hillock.   Prior episode of shortness of breath while walking in the hospital to schedule a colonoscopy appointment. sent to the emergency room for further evaluation. Systolic pressure was in the 170 range. workup at that time was essentially normal and she was discharged home.  2013 01/14/2012 Stress test showed no ischemia, Lexiscan  2011 06/22/2009 Cardiac Catheter performed at [facility] details 90% proximal LAD, 40% mid LAD, 40% diagonal, 60% proximal circumflex followed by 40% circumflex lesion, 30%, 40% and 30% lesion noted in the RCA with distal RCA with 30% and 25% lesions. Mild atheroma  in the aorta. Xience 2.75 x 12 mm Drug eluting stent placed.    PMH:   has a past medical history of Bladder incontinence, CAD (coronary artery disease), Cancer ([place]), Carotid arterial disease w/ R Carotid Bruit (HCC), Chronic diastolic (congestive) heart failure (HCC), Chronic Dyspnea on exertion, CKD (chronic kidney disease) stage 3, GFR 30-59 ml/min (HCC), Degenerative  arthritis of knee, Diabetes mellitus, GIB (gastrointestinal bleeding), Hiatal hernia, Hypertension, Iron deficiency, Menopausal symptoms, Morbid obesity ([place]), Renal insufficiency, and Thyroid disease.  PSH:    Past Surgical History:  Procedure Laterality Date  . COLONOSCOPY  2015  . COLONOSCOPY WITH PROPOFOL N/A 09/25/2016   Procedure: COLONOSCOPY WITH PROPOFOL;  Surgeon: [name], MD;  Location: [facility] ENDOSCOPY;  Service: Gastroenterology;  Laterality: N/A;  . COLONOSCOPY WITH PROPOFOL N/A 09/26/2016   Procedure: COLONOSCOPY WITH PROPOFOL;  Surgeon: [name], MD;  Location: [facility] ENDOSCOPY;  Service: Gastroenterology;  Laterality: N/A;  . ESOPHAGOGASTRODUODENOSCOPY (EGD) WITH PROPOFOL N/A 09/25/2016   Procedure: ESOPHAGOGASTRODUODENOSCOPY (EGD) WITH PROPOFOL;  Surgeon: [name], MD;  Location: [facility] ENDOSCOPY;  Service: Gastroenterology;  Laterality: N/A;  . RENAL ANGIOGRAPHY N/A 02/07/2019   Procedure: RENAL ANGIOGRAPHY;  Surgeon: [name], MD;  Location: [facility] [place] CV LAB;  Service: Cardiovascular;  Laterality: N/A;  . REPLACEMENT TOTAL KNEE BILATERAL    . TOTAL VAGINAL HYSTERECTOMY     ovarian mass, not cancerous  . UPPER GI ENDOSCOPY  2015    Current Outpatient Medications  Medication Sig Dispense Refill  . albuterol (PROVENTIL HFA;VENTOLIN HFA) 108 (90 Base) MCG/ACT inhaler Inhale 2 puffs into the lungs every 6 (six) hours as needed for wheezing or shortness of breath.    Marland Kitchen amitriptyline (ELAVIL) 10 MG tablet Take 10 mg by mouth at bedtime.    . Capsaicin 0.035 % CREA Apply to affected region up to 3 times a day 42.5 g 0  . cloNIDine (CATAPRES - DOSED IN MG/24 HR) 0.2 mg/24hr patch Place 1 patch (0.2 mg total) onto the skin once a week. 4 patch 12  . clopidogrel (PLAVIX) 75 MG tablet Take 1 tablet (75 mg total) by mouth daily. 90 tablet 3  . hydrALAZINE (APRESOLINE) 100 MG tablet Take 100 mg by mouth 3 (three) times daily.     Marland Kitchen levothyroxine (SYNTHROID) 88 MCG tablet 88 mcg  daily before breakfast.     . montelukast (SINGULAIR) 10 MG tablet Take 10 mg by mouth at bedtime.     . nitroGLYCERIN (NITROSTAT) 0.4 MG SL tablet Place 1 tablet (0.4 mg total) under the tongue every 5 (five) minutes x 3 doses as needed for chest pain.  12  . oxybutynin (DITROPAN-XL) 5 MG 24 hr tablet Take 5 mg by mouth daily.     Marland Kitchen oxyCODONE-acetaminophen (PERCOCET) 10-325 MG tablet Take 1 tablet by mouth every 8 (eight) hours as needed for pain.    . pantoprazole (PROTONIX) 40 MG tablet Take 20 mg by mouth daily.     . pregabalin (LYRICA) 50 MG capsule 50 mg during the day, 100 mg qhs 90 capsule 3  . simvastatin (ZOCOR) 40 MG tablet Take 1 tablet (40 mg total) by mouth at bedtime. 90 tablet 2  . SYMBICORT 160-4.5 MCG/ACT inhaler Inhale 2 puffs into the lungs 2 (two) times daily.     Marland Kitchen torsemide (DEMADEX) 20 MG tablet Take 20 mg by mouth 3 (three) times a week.     No current facility-administered medications for this visit.   Facility-Administered Medications Ordered in Other Visits  Medication Dose Route Frequency Provider Last Rate Last Admin  . 0.9 %  sodium chloride infusion   Intravenous Continuous Lloyd Huger, MD      . epoetin alfa-epbx (RETACRIT) injection 40,000 Units  40,000 Units Subcutaneous Once Lloyd Huger, MD         Allergies:   Atenolol, Codeine, Losartan, Morphine and related, Nsaids, Rofecoxib, Sucralfate, and Sulfa antibiotics   Social History:  The patient  reports that she has never smoked. She has never used smokeless tobacco. She reports that she does not drink alcohol and does not use drugs.   Family History:   family history includes Breast cancer (age of onset: 47) in her mother.    Review of Systems: Review of Systems  Constitutional: Negative.   HENT: Negative.   Respiratory: Positive for shortness of breath.   Cardiovascular: Positive for leg swelling.  Gastrointestinal: Negative.   Musculoskeletal: Negative.        Gait instability   Neurological: Negative.   Psychiatric/Behavioral: Negative.   All other systems reviewed and are negative.   PHYSICAL EXAM: VS:  There were no vitals taken for this visit. , BMI There is no height or weight on file to calculate BMI.  Constitutional:  oriented to person, place, and time. No distress.  Obese HENT:  Head: Grossly normal Eyes:  no discharge. No scleral icterus.  Neck: No JVD, no carotid bruits  Cardiovascular: Regular rate and rhythm, no murmurs appreciated Trace edema L>R Pulmonary/Chest: Clear to auscultation bilaterally, no wheezes or rails Abdominal: Soft.  no distension.  no tenderness.  Musculoskeletal: Normal range of motion Neurological:  normal muscle tone. Coordination normal. No atrophy Skin: Skin warm and dry Psychiatric: normal affect, pleasant   Recent Labs: 08/12/2019: ALT 11; B Natriuretic Peptide 58.8 08/13/2019: TSH 1.962 08/23/2019: BUN 58; Creatinine, Ser 1.84; Magnesium 1.7; Potassium 5.3; Sodium 138 11/09/2019: Hemoglobin 10.2; Platelets 165    Lipid Panel Lab Results  Component Value Date   CHOL 110 08/19/2019   HDL 37 (L) 08/19/2019   LDLCALC 62 08/19/2019   TRIG 55 08/19/2019      Wt Readings from Last 3 Encounters:  11/09/19 150 lb (68 kg)  10/18/19 160 lb (72.6 kg)  09/27/19 170 lb 4.8 oz (77.2 kg)       ASSESSMENT AND PLAN:  SOB (shortness of breath) Very deconditioned, sitting most of the time, unable to walk very far without assistance We will continue torsemide 3 times a week, stable renal function  Coronary artery disease of native artery of native heart with stable angina pectoris (HCC)  Currently with no symptoms of angina. No further workup at this time. Continue current medication regimen.  Essential hypertension  Blood pressure is well controlled on today's visit. No changes made to the medications.  Chronic diastolic CHF (congestive heart failure) (HCC) Lower extremity edema likely dependent edema,  recommended compression hose, leg elevation, more walking We will continue torsemide 3 times a week.  She reports that she does not have high fluid intake  Paroxysmal atrial fibrillation (HCC)  High fall risk, several falls recently.  High risk of trauma.  Lives alone Maintaining normal sinus rhythm Not on Eliquis or other anticoagulation apart from Plavix  COPD exacerbation (HCC) Stable, does not limit her ability to exert which is driven predominately by leg weakness Weight trending downwards which may help symptoms  Acute renal failure superimposed on stage 3 chronic kidney disease, unspecified acute renal failure type [facility]) Prior renal stent,  Renal function stable  Anemia, unspecified type followed by hematology Dr. Grayland Ormond  Total encounter time more than 25 minutes. Greater than 50% was spent in counseling and coordination of care with the patient.    Signed, Esmond Plants, M.D., Ph.D. 12/05/2019  [place], [place]

## 2019-12-05 ENCOUNTER — Ambulatory Visit (INDEPENDENT_AMBULATORY_CARE_PROVIDER_SITE_OTHER): Payer: Medicare Other | Admitting: Cardiovascular Disease

## 2019-12-05 ENCOUNTER — Encounter: Payer: Self-pay | Admitting: Cardiovascular Disease

## 2019-12-05 ENCOUNTER — Other Ambulatory Visit: Payer: Self-pay

## 2019-12-05 DIAGNOSIS — I25118 Atherosclerotic heart disease of native coronary artery with other forms of angina pectoris: Secondary | ICD-10-CM | POA: Diagnosis not present

## 2019-12-05 DIAGNOSIS — N189 Chronic kidney disease, unspecified: Secondary | ICD-10-CM

## 2019-12-05 DIAGNOSIS — I701 Atherosclerosis of renal artery: Secondary | ICD-10-CM

## 2019-12-05 DIAGNOSIS — E782 Mixed hyperlipidemia: Secondary | ICD-10-CM

## 2019-12-05 DIAGNOSIS — I5032 Chronic diastolic (congestive) heart failure: Secondary | ICD-10-CM | POA: Diagnosis not present

## 2019-12-05 DIAGNOSIS — E1122 Type 2 diabetes mellitus with diabetic chronic kidney disease: Secondary | ICD-10-CM

## 2019-12-05 DIAGNOSIS — I480 Paroxysmal atrial fibrillation: Secondary | ICD-10-CM | POA: Diagnosis not present

## 2019-12-05 DIAGNOSIS — N183 Chronic kidney disease, stage 3 unspecified: Secondary | ICD-10-CM

## 2019-12-05 DIAGNOSIS — I10 Essential (primary) hypertension: Secondary | ICD-10-CM

## 2019-12-05 NOTE — Patient Instructions (Addendum)
We will look into PT at home for falls, weakness  Medication Instructions:  No changes  If you need a refill on your cardiac medications before your next appointment, please call your pharmacy.    Lab work: No new labs needed   If you have labs (blood work) drawn today and your tests are completely normal, you will receive your results only by: Marland Kitchen MyChart Message (if you have MyChart) OR . A paper copy in the mail If you have any lab test that is abnormal or we need to change your treatment, we will call you to review the results.   Testing/Procedures: No new testing needed   Follow-Up: At [facility], you and your health needs are our priority.  As part of our continuing mission to provide you with exceptional heart care, we have created designated Provider Care Teams.  These Care Teams include your primary Cardiologist (physician) and Advanced Practice Providers (APPs -  Physician Assistants and Nurse Practitioners) who all work together to provide you with the care you need, when you need it.  . You will need a follow up appointment in 6 months  . Providers on your designated Care Team:   . Murray Hodgkins, NP . Christell Faith, PA-C . Marrianne Mood, PA-C  Any Other Special Instructions Will Be Listed Below (If Applicable).  COVID-19 Vaccine Information can be found at: ShippingScam.co.uk For questions related to vaccine distribution or appointments, please email vaccine@[email or web address] .com or call [phone].

## 2019-12-05 NOTE — Addendum Note (Signed)
Addended by: Valora Corporal on: 12/05/2019 05:12 PM   Modules accepted: Orders

## 2019-12-06 ENCOUNTER — Encounter (INDEPENDENT_AMBULATORY_CARE_PROVIDER_SITE_OTHER): Payer: Medicare Other

## 2019-12-06 ENCOUNTER — Ambulatory Visit (INDEPENDENT_AMBULATORY_CARE_PROVIDER_SITE_OTHER): Payer: Medicare Other | Admitting: Nurse Practitioner

## 2019-12-06 NOTE — Addendum Note (Signed)
Addended by: Janan Ridge on: 12/06/2019 04:40 PM   Modules accepted: Orders

## 2019-12-08 ENCOUNTER — Inpatient Hospital Stay: Payer: Medicare Other | Attending: Oncology

## 2019-12-08 ENCOUNTER — Inpatient Hospital Stay: Payer: Medicare Other

## 2019-12-08 ENCOUNTER — Other Ambulatory Visit: Payer: Self-pay

## 2019-12-08 VITALS — BP 124/75 | HR 68

## 2019-12-08 DIAGNOSIS — D649 Anemia, unspecified: Secondary | ICD-10-CM

## 2019-12-08 DIAGNOSIS — N183 Chronic kidney disease, stage 3 unspecified: Secondary | ICD-10-CM | POA: Diagnosis not present

## 2019-12-08 DIAGNOSIS — D631 Anemia in chronic kidney disease: Secondary | ICD-10-CM | POA: Insufficient documentation

## 2019-12-08 DIAGNOSIS — D500 Iron deficiency anemia secondary to blood loss (chronic): Secondary | ICD-10-CM

## 2019-12-08 LAB — CBC WITH DIFFERENTIAL/PLATELET
Abs Immature Granulocytes: 0.03 10*3/uL (ref 0.00–0.07)
Basophils Absolute: 0 10*3/uL (ref 0.0–0.1)
Basophils Relative: 0 %
Eosinophils Absolute: 0.4 10*3/uL (ref 0.0–0.5)
Eosinophils Relative: 6 %
HCT: 27.3 % — ABNORMAL LOW (ref 36.0–46.0)
Hemoglobin: 8.9 g/dL — ABNORMAL LOW (ref 12.0–15.0)
Immature Granulocytes: 0 %
Lymphocytes Relative: 19 %
Lymphs Abs: 1.4 10*3/uL (ref 0.7–4.0)
MCH: 30.4 pg (ref 26.0–34.0)
MCHC: 32.6 g/dL (ref 30.0–36.0)
MCV: 93.2 fL (ref 80.0–100.0)
Monocytes Absolute: 0.8 10*3/uL (ref 0.1–1.0)
Monocytes Relative: 11 %
Neutro Abs: 4.4 10*3/uL (ref 1.7–7.7)
Neutrophils Relative %: 64 %
Platelets: 176 10*3/uL (ref 150–400)
RBC: 2.93 MIL/uL — ABNORMAL LOW (ref 3.87–5.11)
RDW: 14.5 % (ref 11.5–15.5)
WBC: 7 10*3/uL (ref 4.0–10.5)
nRBC: 0 % (ref 0.0–0.2)

## 2019-12-08 LAB — FERRITIN: Ferritin: 234 ng/mL (ref 11–307)

## 2019-12-08 LAB — IRON AND TIBC
Iron: 33 ug/dL (ref 28–170)
Saturation Ratios: 25 % (ref 10.4–31.8)
TIBC: 133 ug/dL — ABNORMAL LOW (ref 250–450)
UIBC: 100 ug/dL

## 2019-12-08 MED ORDER — EPOETIN ALFA-EPBX 40000 UNIT/ML IJ SOLN
40000.0000 [IU] | Freq: Once | INTRAMUSCULAR | Status: AC
  Administered 2019-12-08: 40000 [IU] via SUBCUTANEOUS
  Filled 2019-12-08: qty 1

## 2019-12-12 ENCOUNTER — Telehealth: Payer: Self-pay

## 2019-12-12 NOTE — Telephone Encounter (Signed)
3:30pm. Palliative care SW outreached patient to schedule in home visit. Patient shared that she had not been feeling too well and now has [facility] coming out to her home as well. SW inquired if patient would at least feel up to a telephonic visit, patient agreed.   Telephonic visit completed during this time. Patient shared that she has eating  And sleeping fine. Still has pain from Shingles and takes oxycodone, but has minimal relief. Patient stated that she had a fall a few weeks ago and one her doctors ordered [facility] PT, and they came out today to do evaluation and will be back on Thu. Patient stated that she is looking forward to the PT sessions. Patient denied needing any medication refills at this time. Patient shared that she may feel up to an in home visit in Nov. Patient appreciative of call. Palliative care to outreach again in a few week to schedule in home visit. Palliative to continue to follow.

## 2019-12-16 ENCOUNTER — Other Ambulatory Visit: Payer: Self-pay | Admitting: Internal Medicine

## 2019-12-16 DIAGNOSIS — Z1231 Encounter for screening mammogram for malignant neoplasm of breast: Secondary | ICD-10-CM

## 2019-12-17 ENCOUNTER — Other Ambulatory Visit: Payer: Self-pay | Admitting: Student in an Organized Health Care Education/Training Program

## 2019-12-17 MED ORDER — PREGABALIN 50 MG PO CAPS: ORAL_CAPSULE | ORAL | 3 refills | Status: DC

## 2019-12-19 ENCOUNTER — Other Ambulatory Visit: Payer: Self-pay

## 2019-12-19 ENCOUNTER — Encounter: Payer: Self-pay | Admitting: Podiatry

## 2019-12-19 ENCOUNTER — Ambulatory Visit (INDEPENDENT_AMBULATORY_CARE_PROVIDER_SITE_OTHER): Payer: Medicare Other | Admitting: Podiatry

## 2019-12-19 DIAGNOSIS — D689 Coagulation defect, unspecified: Secondary | ICD-10-CM

## 2019-12-19 DIAGNOSIS — B351 Tinea unguium: Secondary | ICD-10-CM | POA: Diagnosis not present

## 2019-12-19 DIAGNOSIS — M79676 Pain in unspecified toe(s): Secondary | ICD-10-CM

## 2019-12-19 DIAGNOSIS — E119 Type 2 diabetes mellitus without complications: Secondary | ICD-10-CM

## 2019-12-19 DIAGNOSIS — L608 Other nail disorders: Secondary | ICD-10-CM | POA: Diagnosis not present

## 2019-12-19 NOTE — Progress Notes (Signed)
This patient returns to my office for at risk foot care.  This patient requires this care by a professional since this patient will be at risk due to having chronic kidney disease diabetes and coagulation defect.  Patient is taking plavix.  This patient is unable to cut nails herself since the patient cannot reach her nails.These nails are painful walking and wearing shoes.  This patient presents for at risk foot care today. Patient presents to the office in a wheelchair due to shingles.  Patient has enlarged area on her posterior left lower left.  General Appearance  Alert, conversant and in no acute stress.  Vascular  Dorsalis pedis and posterior tibial  pulses are palpable  bilaterally.  Capillary return is within normal limits  bilaterally. Temperature is within normal limits  bilaterally.  Neurologic  Senn-Weinstein monofilament wire test within normal limits  bilaterally. Muscle power within normal limits bilaterally.  Nails Thick disfigured discolored nails with subungual debris  from hallux to fifth toes bilaterally. No evidence of bacterial infection or drainage bilaterally.  Orthopedic  No limitations of motion  feet .  No crepitus or effusions noted.  No bony pathology or digital deformities noted.  Skin  normotropic skin with no porokeratosis noted bilaterally.  No signs of infections or ulcers noted.     Onychomycosis  Pain in right toes  Pain in left toes  Consent was obtained for treatment procedures.   Mechanical debridement of nails 1-5  bilaterally performed with a nail nipper.  Filed with dremel without incident. Told her be watch the size of the enlargement  And see her MD as needed.  Return office visit   3 months                   Told patient to return for periodic foot care and evaluation due to potential at risk complications.   Gardiner Barefoot DPM

## 2020-01-06 ENCOUNTER — Telehealth: Payer: Self-pay

## 2020-01-06 NOTE — Telephone Encounter (Signed)
1151 am.  Phone call made to patient to schedule an in person visit.  Patient states she has to much going on right now.  Next week she has 2 MD appointments and PT is coming in 2 x a week.  Patient reports she is still having some issues with shingles pain.  She reports this has been going on for about 4 months.  She also feels she has gotten weaker since that episode.  No recent falls are reported. She is hopeful therapy will be able to help with strengthening.  Patient states she is unable to walk for long distances and is really in need of a transport chair.  I reviewed chart and patient does have an order for a transport chair that was sent to [facility].  I advised patient that I would follow up with this order to see if we can expedite.  Patient reports her appetite had dropped off recently but she is back to eating 3 meals a day.  She notes her family is keeping her well stocked and ensuring she has meals and snacks.  Patient believes her weight is about 160 lbs.  She does report swelling to her lower extremities.  Encouraged patient to elevate her legs as much as possible.  Patient reports she is sleeping okay during the night but awakens about 1-2 x to use the bathroom.  Advised patient that the PC would reach out to her again next month to check in and complete a home visit.   1214 pm.  Phone call made to [employer] to follow up on the order for a transport chair.  Spoke with Jonelle Sidle who is unable to locate patient in the system.  Advised that I would call PCP office again and have them fax the order over to [employer].  Jonelle Sidle has provided a fax # of [phone].  1221 pm  Phone call made to PCP and requested order be re-faxed to above #.  1231 pm.  Phone call made patient to advise her of above.

## 2020-01-07 NOTE — Progress Notes (Signed)
[place]  Telephone:([phone]) [phone] [phone]) [phone]  ID: Amy [name] OB: [birth year]  MR#: [id number]  FGH#:[id number]  Patient Care Team: [name], MD as PCP - General (Internal Medicine) Rockey Situ Kathlene November, MD as PCP - Cardiology (Cardiology) Minna Merritts, MD as Consulting Physician (Cardiology)  CHIEF COMPLAINT: Anemia secondary to chronic renal insufficiency.  INTERVAL HISTORY: Patient returns to clinic today for repeat laboratory work, further evaluation, and continuation of Retacrit. She has noticed increased weakness and fatigue recently. She also has to have continued, intermittent pain from her shingles. She has no neurologic complaints. She denies any chest pain, shortness of breath, cough, or hemoptysis.  She denies any nausea, vomiting, constipation, or diarrhea.  She has no urinary complaints. Patient offers no further specific complaints today.  REVIEW OF SYSTEMS:   Review of Systems  Constitutional: Positive for malaise/fatigue. Negative for fever and weight loss.  HENT: Negative for congestion and sore throat.   Respiratory: Negative.  Negative for cough, hemoptysis, shortness of breath and stridor.   Cardiovascular: Negative.  Negative for chest pain and leg swelling.  Gastrointestinal: Negative for abdominal pain, blood in stool and melena.  Genitourinary: Negative.  Negative for hematuria.  Musculoskeletal: Positive for myalgias. Negative for back pain and joint pain.  Skin: Negative.  Negative for rash.  Neurological: Positive for weakness. Negative for sensory change, focal weakness and headaches.  Psychiatric/Behavioral: Negative.  The patient is not nervous/anxious.     As per HPI. Otherwise, a complete review of systems is negative.  PAST MEDICAL HISTORY: Past Medical History:  Diagnosis Date  . Bladder incontinence   . CAD (coronary artery disease)    a. 05/2009 Cath: LAD 90p (Xience 2.75 x 12 mm DES), 19m, D1 40,  LCx 40p/m, RCA 30/40/30p/m, RCA 30/25d;  b.  12/2011 Lexiscan MV: no ischemia, breast attenuation artifact, normal EF-->Low risk; c. 10/2016 MV: fixed apical defect, most likely apical thinning and attenuation, No ischemia, EF 60%.  . Cancer ([place])    ovarian  . Carotid arterial disease w/ R Carotid Bruit (HCC)    a. 08/2016 Carotid U/S: 40-59% bilat ICA stenosis - f/u 1 yr.  . Chronic diastolic (congestive) heart failure ([place])    a. Echo 09/2015: EF 55-60% w/ Grade 1 DD, sev Ca2+ MV annulus, mildly dil LA; b. 09/2016 Echo: EF 65-70%, Gr2 DD, mildly dil LA/RA, nl RV fxn.  . Chronic Dyspnea on exertion   . CKD (chronic kidney disease) stage 3, GFR 30-59 ml/min (HCC)   . Degenerative arthritis of knee    bilateral knees  . Diabetes mellitus    Type II  . GIB (gastrointestinal bleeding)    a. 08/2016 Admission w/ presyncope/anemia/melena-->req Transfusion-->endo ok, colonoscopy w/ polyps but no source of bleeding (most likely diverticular).  . Hiatal hernia   . Hypertension   . Iron deficiency   . Menopausal symptoms   . Morbid obesity ([place])   . Renal insufficiency   . Thyroid disease    hypothyroidism    PAST SURGICAL HISTORY: Past Surgical History:  Procedure Laterality Date  . COLONOSCOPY  2015  . COLONOSCOPY WITH PROPOFOL N/A 09/25/2016   Procedure: COLONOSCOPY WITH PROPOFOL;  Surgeon: [name], MD;  Location[facility] ENDOSCOPY;  Service: Gastroenterology;  Laterality: N/A;  . COLONOSCOPY WITH PROPOFOL N/A 09/26/2016   Procedure: COLONOSCOPY WITH PROPOFOL;  Surgeon: [name], MD;  Location: [facility] ENDOSCOPY;  Service: Gastroenterology;  Laterality: N/A;  . ESOPHAGOGASTRODUODENOSCOPY (EGD) WITH PROPOFOL N/A 09/25/2016  Procedure: ESOPHAGOGASTRODUODENOSCOPY (EGD) WITH PROPOFOL;  Surgeon: [name], MD;  Location: [facility] ENDOSCOPY;  Service: Gastroenterology;  Laterality: N/A;  . RENAL ANGIOGRAPHY N/A 02/07/2019   Procedure: RENAL ANGIOGRAPHY;  Surgeon: [name], MD;  Location: [place] CV LAB;  Service: Cardiovascular;  Laterality: N/A;  . REPLACEMENT TOTAL KNEE BILATERAL    . TOTAL VAGINAL HYSTERECTOMY     ovarian mass, not cancerous  . UPPER GI ENDOSCOPY  2015    FAMILY HISTORY Family History  Problem Relation Age of Onset  . Breast cancer Mother 34       ADVANCED DIRECTIVES:    HEALTH MAINTENANCE: Social History   Tobacco Use  . Smoking status: Never Smoker  . Smokeless tobacco: Never Used  Vaping Use  . Vaping Use: Never used  Substance Use Topics  . Alcohol use: No  . Drug use: No     Colonoscopy:  PAP:  Bone density:  Lipid panel:  Allergies  Allergen Reactions  . Atenolol Other (See Comments)    Other reaction(s): Other (See Comments) Decreased heart rate Decreased heart rate  . Codeine Shortness Of Breath    Rash, difficulty breathing, nausea.  . Losartan Other (See Comments)    Hyperkalemia  . Morphine And Related Shortness Of Breath    Rash, difficulty breathing, nausea.   . Nsaids Other (See Comments)    Other reaction(s): Unknown  . Rofecoxib     Other reaction(s): Unknown  . Sucralfate Other (See Comments)    Throat tightness  . Sulfa Antibiotics     Other reaction(s): Unknown    Current Outpatient Medications  Medication Sig Dispense Refill  . albuterol (PROVENTIL HFA;VENTOLIN HFA) 108 (90 Base) MCG/ACT inhaler Inhale 2 puffs into the lungs every 6 (six) hours as needed for wheezing or shortness of breath.    Marland Kitchen amitriptyline (ELAVIL) 10 MG tablet Take 10 mg by mouth at bedtime.    . Capsaicin 0.035 % CREA Apply to affected region up to 3 times a day 42.5 g 0  . clopidogrel (PLAVIX) 75 MG tablet Take 1 tablet (75 mg total) by mouth daily. 90 tablet 3  . hydrochlorothiazide (HYDRODIURIL) 50 MG tablet Take by mouth.    . levothyroxine (SYNTHROID) 88 MCG tablet 88 mcg daily before breakfast.     . montelukast (SINGULAIR) 10 MG tablet Take 10 mg by mouth at bedtime.     Marland Kitchen oxybutynin (DITROPAN-XL) 5 MG 24 hr tablet  Take 5 mg by mouth daily.     . pantoprazole (PROTONIX) 40 MG tablet Take 20 mg by mouth daily.     . pregabalin (LYRICA) 50 MG capsule 50 mg during the day, 100 mg qhs 90 capsule 3  . simvastatin (ZOCOR) 40 MG tablet Take 1 tablet (40 mg total) by mouth at bedtime. 90 tablet 2  . SYMBICORT 160-4.5 MCG/ACT inhaler Inhale 2 puffs into the lungs 2 (two) times daily.     . cloNIDine (CATAPRES - DOSED IN MG/24 HR) 0.2 mg/24hr patch Place 1 patch (0.2 mg total) onto the skin once a week. 4 patch 12  . doxazosin (CARDURA) 8 MG tablet Take by mouth.    . hydrALAZINE (APRESOLINE) 100 MG tablet Take 100 mg by mouth 3 (three) times daily.  (Patient not taking: Reported on 01/11/2020)    . lisinopril (ZESTRIL) 2.5 MG tablet Take by mouth.    . nitroGLYCERIN (NITROSTAT) 0.4 MG SL tablet Place 1 tablet (0.4 mg total) under the tongue every 5 (five)  minutes x 3 doses as needed for chest pain. (Patient not taking: Reported on 01/11/2020)  12  . oxyCODONE-acetaminophen (PERCOCET) 10-325 MG tablet Take 1 tablet by mouth every 8 (eight) hours as needed for pain. (Patient not taking: Reported on 01/11/2020)    . torsemide (DEMADEX) 20 MG tablet Take 20 mg by mouth 3 (three) times a week.     No current facility-administered medications for this visit.   Facility-Administered Medications Ordered in Other Visits  Medication Dose Route Frequency Provider Last Rate Last Admin  . 0.9 %  sodium chloride infusion   Intravenous Continuous Lloyd Huger, MD      . epoetin alfa-epbx (RETACRIT) injection 40,000 Units  40,000 Units Subcutaneous Once Lloyd Huger, MD        OBJECTIVE: Vitals:   01/11/20 1017  BP: (!) 145/45  Pulse: 67  Temp: (!) 95.8 F (35.4 C)     Body mass index is 32.92 kg/m.    ECOG FS:2 - Symptomatic, <50% confined to bed   General: Well-developed, well-nourished, no acute distress. Eyes: Pink conjunctiva, anicteric sclera. HEENT: Normocephalic, moist mucous membranes. Lungs:  No audible wheezing or coughing. Heart: Regular rate and rhythm. Abdomen: Soft, nontender, no obvious distention. Musculoskeletal: No edema, cyanosis, or clubbing. Neuro: Alert, answering all questions appropriately. Cranial nerves grossly intact. Skin: No rashes or petechiae noted. Psych: Normal affect.  LAB RESULTS:  Lab Results  Component Value Date   NA 138 08/23/2019   K 5.3 (H) 08/23/2019   CL 113 (H) 08/23/2019   CO2 20 (L) 08/23/2019   GLUCOSE 111 (H) 08/23/2019   BUN 58 (H) 08/23/2019   CREATININE 1.84 (H) 08/23/2019   CALCIUM 8.4 (L) 08/23/2019   PROT 5.2 (L) 08/12/2019   ALBUMIN 2.9 (L) 08/12/2019   AST 14 (L) 08/12/2019   ALT 11 08/12/2019   ALKPHOS 58 08/12/2019   BILITOT 0.7 08/12/2019   GFRNONAA 24 (L) 08/23/2019   GFRAA 27 (L) 08/23/2019    Lab Results  Component Value Date   WBC 6.7 01/11/2020   NEUTROABS 4.8 01/11/2020   HGB 7.6 (L) 01/11/2020   HCT 23.8 (L) 01/11/2020   MCV 94.4 01/11/2020   PLT 176 01/11/2020   Lab Results  Component Value Date   IRON 44 01/11/2020   TIBC 193 (L) 01/11/2020   IRONPCTSAT 23 01/11/2020    Lab Results  Component Value Date   FERRITIN 125 01/11/2020    STUDIES: No results found.  ASSESSMENT: Anemia secondary to chronic renal insufficiency.  PLAN:    1.  Anemia secondary to chronic renal insufficiency: Patient's iron stores continue to be within normal limits. Her hemoglobin has significantly trended down is now 7.6 and she is symptomatic. Proceed with 40,000 units Retacrit today. She last received treatment with Feraheme on June 22, 2019. Return to clinic in 1 month for repeat laboratory work, further evaluation, and consideration of Retacrit or blood transfusion depending on her hemoglobin level. 2. Pulmonary nodules: Repeat CT scan from January 13, 2017 revealed stable pulmonary nodules.  No further imaging is necessary.   3. Renal insufficiency: Chronic and unchanged. Continue follow-up with nephrology  as scheduled. 4.  Hypertension: Blood pressure mildly elevated today. Continue follow-up with primary care. 5.  History of GI bleed: Colonoscopy and EGD in August 2018.  Continue follow-up with GI as indicated. 6.  Pain: Chronic and unchanged. 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  Continue treatment per primary care.  Patient expressed understanding and was in agreement with  this plan. She also understands that She can call clinic at any time with any questions, concerns, or complaints.    Lloyd Huger, MD   01/14/2020 7:33 AM

## 2020-01-09 ENCOUNTER — Ambulatory Visit: Payer: Medicare Other | Admitting: Cardiovascular Disease

## 2020-01-10 ENCOUNTER — Other Ambulatory Visit: Payer: Self-pay | Admitting: *Deleted

## 2020-01-10 ENCOUNTER — Encounter: Payer: Self-pay | Admitting: Oncology

## 2020-01-10 DIAGNOSIS — D500 Iron deficiency anemia secondary to blood loss (chronic): Secondary | ICD-10-CM

## 2020-01-11 ENCOUNTER — Inpatient Hospital Stay: Payer: Medicare Other | Attending: Oncology

## 2020-01-11 ENCOUNTER — Inpatient Hospital Stay (HOSPITAL_BASED_OUTPATIENT_CLINIC_OR_DEPARTMENT_OTHER): Payer: Medicare Other | Admitting: Oncology

## 2020-01-11 ENCOUNTER — Inpatient Hospital Stay: Payer: Medicare Other

## 2020-01-11 ENCOUNTER — Other Ambulatory Visit: Payer: Self-pay

## 2020-01-11 VITALS — BP 145/45 | HR 67 | Temp 95.8°F | Wt 180.0 lb

## 2020-01-11 DIAGNOSIS — N183 Chronic kidney disease, stage 3 unspecified: Secondary | ICD-10-CM | POA: Insufficient documentation

## 2020-01-11 DIAGNOSIS — D631 Anemia in chronic kidney disease: Secondary | ICD-10-CM

## 2020-01-11 DIAGNOSIS — D500 Iron deficiency anemia secondary to blood loss (chronic): Secondary | ICD-10-CM

## 2020-01-11 DIAGNOSIS — I701 Atherosclerosis of renal artery: Secondary | ICD-10-CM | POA: Diagnosis not present

## 2020-01-11 DIAGNOSIS — N189 Chronic kidney disease, unspecified: Secondary | ICD-10-CM | POA: Diagnosis not present

## 2020-01-11 LAB — CBC WITH DIFFERENTIAL/PLATELET
Abs Immature Granulocytes: 0.05 10*3/uL (ref 0.00–0.07)
Basophils Absolute: 0 10*3/uL (ref 0.0–0.1)
Basophils Relative: 0 %
Eosinophils Absolute: 0.1 10*3/uL (ref 0.0–0.5)
Eosinophils Relative: 2 %
HCT: 23.8 % — ABNORMAL LOW (ref 36.0–46.0)
Hemoglobin: 7.6 g/dL — ABNORMAL LOW (ref 12.0–15.0)
Immature Granulocytes: 1 %
Lymphocytes Relative: 16 %
Lymphs Abs: 1 10*3/uL (ref 0.7–4.0)
MCH: 30.2 pg (ref 26.0–34.0)
MCHC: 31.9 g/dL (ref 30.0–36.0)
MCV: 94.4 fL (ref 80.0–100.0)
Monocytes Absolute: 0.6 10*3/uL (ref 0.1–1.0)
Monocytes Relative: 9 %
Neutro Abs: 4.8 10*3/uL (ref 1.7–7.7)
Neutrophils Relative %: 72 %
Platelets: 176 10*3/uL (ref 150–400)
RBC: 2.52 MIL/uL — ABNORMAL LOW (ref 3.87–5.11)
RDW: 16.6 % — ABNORMAL HIGH (ref 11.5–15.5)
WBC: 6.7 10*3/uL (ref 4.0–10.5)
nRBC: 0 % (ref 0.0–0.2)

## 2020-01-11 LAB — IRON AND TIBC
Iron: 44 ug/dL (ref 28–170)
Saturation Ratios: 23 % (ref 10.4–31.8)
TIBC: 193 ug/dL — ABNORMAL LOW (ref 250–450)
UIBC: 149 ug/dL

## 2020-01-11 LAB — FERRITIN: Ferritin: 125 ng/mL (ref 11–307)

## 2020-01-11 MED ORDER — EPOETIN ALFA-EPBX 40000 UNIT/ML IJ SOLN
40000.0000 [IU] | Freq: Once | INTRAMUSCULAR | Status: AC
  Administered 2020-01-11: 40000 [IU] via SUBCUTANEOUS
  Filled 2020-01-11: qty 1

## 2020-01-17 ENCOUNTER — Other Ambulatory Visit: Payer: Self-pay | Admitting: *Deleted

## 2020-01-17 ENCOUNTER — Other Ambulatory Visit: Payer: Self-pay

## 2020-01-17 ENCOUNTER — Inpatient Hospital Stay: Payer: Medicare Other

## 2020-01-17 ENCOUNTER — Other Ambulatory Visit: Payer: Self-pay | Admitting: Oncology

## 2020-01-17 DIAGNOSIS — D500 Iron deficiency anemia secondary to blood loss (chronic): Secondary | ICD-10-CM

## 2020-01-17 DIAGNOSIS — N183 Chronic kidney disease, stage 3 unspecified: Secondary | ICD-10-CM | POA: Diagnosis not present

## 2020-01-17 DIAGNOSIS — D509 Iron deficiency anemia, unspecified: Secondary | ICD-10-CM

## 2020-01-17 LAB — CBC WITH DIFFERENTIAL/PLATELET
Abs Immature Granulocytes: 0.05 10*3/uL (ref 0.00–0.07)
Basophils Absolute: 0 10*3/uL (ref 0.0–0.1)
Basophils Relative: 0 %
Eosinophils Absolute: 0.1 10*3/uL (ref 0.0–0.5)
Eosinophils Relative: 2 %
HCT: 25.2 % — ABNORMAL LOW (ref 36.0–46.0)
Hemoglobin: 7.6 g/dL — ABNORMAL LOW (ref 12.0–15.0)
Immature Granulocytes: 1 %
Lymphocytes Relative: 17 %
Lymphs Abs: 1.1 10*3/uL (ref 0.7–4.0)
MCH: 30.4 pg (ref 26.0–34.0)
MCHC: 30.2 g/dL (ref 30.0–36.0)
MCV: 100.8 fL — ABNORMAL HIGH (ref 80.0–100.0)
Monocytes Absolute: 0.7 10*3/uL (ref 0.1–1.0)
Monocytes Relative: 10 %
Neutro Abs: 4.8 10*3/uL (ref 1.7–7.7)
Neutrophils Relative %: 70 %
Platelets: 204 10*3/uL (ref 150–400)
RBC: 2.5 MIL/uL — ABNORMAL LOW (ref 3.87–5.11)
RDW: 18.9 % — ABNORMAL HIGH (ref 11.5–15.5)
WBC: 6.7 10*3/uL (ref 4.0–10.5)
nRBC: 0 % (ref 0.0–0.2)

## 2020-01-17 LAB — SAMPLE TO BLOOD BANK

## 2020-01-17 LAB — BASIC METABOLIC PANEL
Anion gap: 11 (ref 5–15)
BUN: 42 mg/dL — ABNORMAL HIGH (ref 8–23)
CO2: 26 mmol/L (ref 22–32)
Calcium: 7.9 mg/dL — ABNORMAL LOW (ref 8.9–10.3)
Chloride: 106 mmol/L (ref 98–111)
Creatinine, Ser: 1.88 mg/dL — ABNORMAL HIGH (ref 0.44–1.00)
GFR, Estimated: 25 mL/min — ABNORMAL LOW (ref >60)
Glucose, Bld: 140 mg/dL — ABNORMAL HIGH (ref 70–99)
Potassium: 4.4 mmol/L (ref 3.5–5.1)
Sodium: 143 mmol/L (ref 135–145)

## 2020-01-17 LAB — PREPARE RBC (CROSSMATCH)

## 2020-01-17 NOTE — Progress Notes (Unsigned)
bmp 

## 2020-01-18 ENCOUNTER — Ambulatory Visit
Admission: RE | Admit: 2020-01-18 | Discharge: 2020-01-18 | Disposition: A | Discharge status: Home / Self Care | Payer: Medicare Other | Source: Ambulatory Visit | Attending: Oncology | Admitting: Oncology

## 2020-01-18 DIAGNOSIS — D509 Iron deficiency anemia, unspecified: Secondary | ICD-10-CM | POA: Diagnosis not present

## 2020-01-18 DIAGNOSIS — N183 Chronic kidney disease, stage 3 unspecified: Secondary | ICD-10-CM | POA: Diagnosis not present

## 2020-01-18 MED ORDER — HEPARIN SOD (PORK) LOCK FLUSH 100 UNIT/ML IV SOLN: 250.0000 [IU] | INTRAVENOUS | Status: DC | PRN

## 2020-01-18 MED ORDER — DIPHENHYDRAMINE HCL 50 MG/ML IJ SOLN
INTRAMUSCULAR | Status: AC
  Filled 2020-01-18: qty 1

## 2020-01-18 MED ORDER — ACETAMINOPHEN 325 MG PO TABS
ORAL_TABLET | ORAL | Status: AC
  Filled 2020-01-18: qty 2

## 2020-01-18 MED ORDER — SODIUM CHLORIDE 0.9% FLUSH: 3.0000 mL | INTRAVENOUS | Status: DC | PRN

## 2020-01-18 MED ORDER — HEPARIN SOD (PORK) LOCK FLUSH 100 UNIT/ML IV SOLN: 500.0000 [IU] | Freq: Every day | INTRAVENOUS | Status: DC | PRN

## 2020-01-18 MED ORDER — SODIUM CHLORIDE 0.9% FLUSH: 10.0000 mL | INTRAVENOUS | Status: DC | PRN

## 2020-01-18 MED ORDER — SODIUM CHLORIDE 0.9% IV SOLUTION: 250.0000 mL | Freq: Once | INTRAVENOUS | Status: DC

## 2020-01-18 MED ORDER — DIPHENHYDRAMINE HCL 50 MG/ML IJ SOLN
25.0000 mg | Freq: Once | INTRAMUSCULAR | Status: AC
  Administered 2020-01-18: 25 mg via INTRAVENOUS

## 2020-01-18 MED ORDER — ACETAMINOPHEN 325 MG PO TABS
650.0000 mg | ORAL_TABLET | Freq: Once | ORAL | Status: AC
  Administered 2020-01-18: 650 mg via ORAL

## 2020-01-19 LAB — TYPE AND SCREEN
ABO/RH(D): O POS
Antibody Screen: NEGATIVE
Unit division: 0

## 2020-01-19 LAB — BPAM RBC
Blood Product Expiration Date: 202112272359
ISSUE DATE / TIME: 202111240821
Unit Type and Rh: 5100

## 2020-01-25 ENCOUNTER — Ambulatory Visit
Admission: RE | Admit: 2020-01-25 | Discharge: 2020-01-25 | Disposition: A | Discharge status: Home / Self Care | Payer: Medicare Other | Source: Ambulatory Visit | Attending: Internal Medicine | Admitting: Internal Medicine

## 2020-01-25 ENCOUNTER — Other Ambulatory Visit: Payer: Self-pay

## 2020-01-25 DIAGNOSIS — Z1231 Encounter for screening mammogram for malignant neoplasm of breast: Secondary | ICD-10-CM | POA: Insufficient documentation

## 2020-02-02 ENCOUNTER — Encounter: Payer: Self-pay | Admitting: Emergency Medicine

## 2020-02-02 ENCOUNTER — Emergency Department: Payer: Medicare Other

## 2020-02-02 ENCOUNTER — Inpatient Hospital Stay
Admission: EM | Admit: 2020-02-02 | Discharge: 2020-02-14 | DRG: 291 | Disposition: A | Discharge status: Skilled Nursing Facility | Payer: Medicare Other | Attending: Hospitalist | Admitting: Hospitalist

## 2020-02-02 ENCOUNTER — Other Ambulatory Visit: Payer: Self-pay

## 2020-02-02 DIAGNOSIS — K219 Gastro-esophageal reflux disease without esophagitis: Secondary | ICD-10-CM | POA: Diagnosis present

## 2020-02-02 DIAGNOSIS — R0600 Dyspnea, unspecified: Secondary | ICD-10-CM

## 2020-02-02 DIAGNOSIS — D509 Iron deficiency anemia, unspecified: Secondary | ICD-10-CM | POA: Diagnosis present

## 2020-02-02 DIAGNOSIS — Z6838 Body mass index (BMI) 38.0-38.9, adult: Secondary | ICD-10-CM | POA: Diagnosis not present

## 2020-02-02 DIAGNOSIS — I5033 Acute on chronic diastolic (congestive) heart failure: Secondary | ICD-10-CM | POA: Diagnosis present

## 2020-02-02 DIAGNOSIS — E039 Hypothyroidism, unspecified: Secondary | ICD-10-CM | POA: Diagnosis not present

## 2020-02-02 DIAGNOSIS — D631 Anemia in chronic kidney disease: Secondary | ICD-10-CM | POA: Diagnosis present

## 2020-02-02 DIAGNOSIS — J4540 Moderate persistent asthma, uncomplicated: Secondary | ICD-10-CM | POA: Diagnosis present

## 2020-02-02 DIAGNOSIS — J9601 Acute respiratory failure with hypoxia: Secondary | ICD-10-CM | POA: Diagnosis present

## 2020-02-02 DIAGNOSIS — Z882 Allergy status to sulfonamides status: Secondary | ICD-10-CM

## 2020-02-02 DIAGNOSIS — Z885 Allergy status to narcotic agent status: Secondary | ICD-10-CM

## 2020-02-02 DIAGNOSIS — B0229 Other postherpetic nervous system involvement: Secondary | ICD-10-CM | POA: Diagnosis present

## 2020-02-02 DIAGNOSIS — I10 Essential (primary) hypertension: Secondary | ICD-10-CM

## 2020-02-02 DIAGNOSIS — Z803 Family history of malignant neoplasm of breast: Secondary | ICD-10-CM

## 2020-02-02 DIAGNOSIS — I440 Atrioventricular block, first degree: Secondary | ICD-10-CM | POA: Diagnosis present

## 2020-02-02 DIAGNOSIS — J9602 Acute respiratory failure with hypercapnia: Secondary | ICD-10-CM | POA: Diagnosis present

## 2020-02-02 DIAGNOSIS — I130 Hypertensive heart and chronic kidney disease with heart failure and stage 1 through stage 4 chronic kidney disease, or unspecified chronic kidney disease: Principal | ICD-10-CM | POA: Diagnosis present

## 2020-02-02 DIAGNOSIS — I5031 Acute diastolic (congestive) heart failure: Secondary | ICD-10-CM | POA: Diagnosis not present

## 2020-02-02 DIAGNOSIS — Z20822 Contact with and (suspected) exposure to covid-19: Secondary | ICD-10-CM | POA: Diagnosis present

## 2020-02-02 DIAGNOSIS — R001 Bradycardia, unspecified: Secondary | ICD-10-CM | POA: Diagnosis present

## 2020-02-02 DIAGNOSIS — Z7902 Long term (current) use of antithrombotics/antiplatelets: Secondary | ICD-10-CM

## 2020-02-02 DIAGNOSIS — Z7189 Other specified counseling: Secondary | ICD-10-CM | POA: Diagnosis not present

## 2020-02-02 DIAGNOSIS — E1122 Type 2 diabetes mellitus with diabetic chronic kidney disease: Secondary | ICD-10-CM | POA: Diagnosis present

## 2020-02-02 DIAGNOSIS — R0602 Shortness of breath: Secondary | ICD-10-CM | POA: Diagnosis present

## 2020-02-02 DIAGNOSIS — Z66 Do not resuscitate: Secondary | ICD-10-CM | POA: Diagnosis present

## 2020-02-02 DIAGNOSIS — Z96653 Presence of artificial knee joint, bilateral: Secondary | ICD-10-CM | POA: Diagnosis present

## 2020-02-02 DIAGNOSIS — J9811 Atelectasis: Secondary | ICD-10-CM | POA: Diagnosis present

## 2020-02-02 DIAGNOSIS — T502X5A Adverse effect of carbonic-anhydrase inhibitors, benzothiadiazides and other diuretics, initial encounter: Secondary | ICD-10-CM | POA: Diagnosis present

## 2020-02-02 DIAGNOSIS — Z79890 Hormone replacement therapy (postmenopausal): Secondary | ICD-10-CM

## 2020-02-02 DIAGNOSIS — E873 Alkalosis: Secondary | ICD-10-CM | POA: Diagnosis present

## 2020-02-02 DIAGNOSIS — E875 Hyperkalemia: Secondary | ICD-10-CM | POA: Diagnosis present

## 2020-02-02 DIAGNOSIS — R32 Unspecified urinary incontinence: Secondary | ICD-10-CM | POA: Diagnosis present

## 2020-02-02 DIAGNOSIS — R0902 Hypoxemia: Secondary | ICD-10-CM

## 2020-02-02 DIAGNOSIS — Z515 Encounter for palliative care: Secondary | ICD-10-CM | POA: Diagnosis not present

## 2020-02-02 DIAGNOSIS — Z888 Allergy status to other drugs, medicaments and biological substances status: Secondary | ICD-10-CM

## 2020-02-02 DIAGNOSIS — Z79899 Other long term (current) drug therapy: Secondary | ICD-10-CM

## 2020-02-02 DIAGNOSIS — N184 Chronic kidney disease, stage 4 (severe): Secondary | ICD-10-CM | POA: Diagnosis present

## 2020-02-02 DIAGNOSIS — I2510 Atherosclerotic heart disease of native coronary artery without angina pectoris: Secondary | ICD-10-CM | POA: Diagnosis present

## 2020-02-02 DIAGNOSIS — I6523 Occlusion and stenosis of bilateral carotid arteries: Secondary | ICD-10-CM | POA: Diagnosis present

## 2020-02-02 DIAGNOSIS — Z886 Allergy status to analgesic agent status: Secondary | ICD-10-CM

## 2020-02-02 DIAGNOSIS — R601 Generalized edema: Secondary | ICD-10-CM

## 2020-02-02 DIAGNOSIS — Z7951 Long term (current) use of inhaled steroids: Secondary | ICD-10-CM

## 2020-02-02 LAB — RESP PANEL BY RT-PCR (FLU A&B, COVID) ARPGX2
Influenza A by PCR: NEGATIVE
Influenza B by PCR: NEGATIVE
SARS Coronavirus 2 by RT PCR: NEGATIVE

## 2020-02-02 LAB — CBC
HCT: 27.8 % — ABNORMAL LOW (ref 36.0–46.0)
Hemoglobin: 8.7 g/dL — ABNORMAL LOW (ref 12.0–15.0)
MCH: 31 pg (ref 26.0–34.0)
MCHC: 31.3 g/dL (ref 30.0–36.0)
MCV: 98.9 fL (ref 80.0–100.0)
Platelets: 145 10*3/uL — ABNORMAL LOW (ref 150–400)
RBC: 2.81 MIL/uL — ABNORMAL LOW (ref 3.87–5.11)
RDW: 17.3 % — ABNORMAL HIGH (ref 11.5–15.5)
WBC: 5.3 10*3/uL (ref 4.0–10.5)
nRBC: 0 % (ref 0.0–0.2)

## 2020-02-02 LAB — BASIC METABOLIC PANEL
Anion gap: 8 (ref 5–15)
BUN: 65 mg/dL — ABNORMAL HIGH (ref 8–23)
CO2: 28 mmol/L (ref 22–32)
Calcium: 8.3 mg/dL — ABNORMAL LOW (ref 8.9–10.3)
Chloride: 107 mmol/L (ref 98–111)
Creatinine, Ser: 2.03 mg/dL — ABNORMAL HIGH (ref 0.44–1.00)
GFR, Estimated: 23 mL/min — ABNORMAL LOW (ref >60)
Glucose, Bld: 89 mg/dL (ref 70–99)
Potassium: 5.6 mmol/L — ABNORMAL HIGH (ref 3.5–5.1)
Sodium: 143 mmol/L (ref 135–145)

## 2020-02-02 LAB — MAGNESIUM: Magnesium: 2.4 mg/dL (ref 1.7–2.4)

## 2020-02-02 LAB — BRAIN NATRIURETIC PEPTIDE: B Natriuretic Peptide: 617.7 pg/mL — ABNORMAL HIGH (ref 0.0–100.0)

## 2020-02-02 LAB — PROTIME-INR
INR: 1 (ref 0.8–1.2)
Prothrombin Time: 13.2 seconds (ref 11.4–15.2)

## 2020-02-02 MED ORDER — PANTOPRAZOLE SODIUM 20 MG PO TBEC
20.0000 mg | DELAYED_RELEASE_TABLET | Freq: Every day | ORAL | Status: DC
  Administered 2020-02-03 – 2020-02-14 (×12): 20 mg via ORAL
  Filled 2020-02-02 (×12): qty 1

## 2020-02-02 MED ORDER — AMITRIPTYLINE HCL 10 MG PO TABS
10.0000 mg | ORAL_TABLET | Freq: Every day | ORAL | Status: DC
  Administered 2020-02-02 – 2020-02-13 (×11): 10 mg via ORAL
  Filled 2020-02-02 (×14): qty 1

## 2020-02-02 MED ORDER — CALCIUM GLUCONATE-NACL 1-0.675 GM/50ML-% IV SOLN
1.0000 g | Freq: Once | INTRAVENOUS | Status: AC
  Administered 2020-02-02: 1000 mg via INTRAVENOUS
  Filled 2020-02-02: qty 50

## 2020-02-02 MED ORDER — FUROSEMIDE 10 MG/ML IJ SOLN
40.0000 mg | Freq: Once | INTRAMUSCULAR | Status: AC
  Administered 2020-02-03: 40 mg via INTRAVENOUS
  Filled 2020-02-02: qty 4

## 2020-02-02 MED ORDER — ACETAMINOPHEN 650 MG RE SUPP: 650.0000 mg | Freq: Four times a day (QID) | RECTAL | Status: DC | PRN

## 2020-02-02 MED ORDER — HYDROCHLOROTHIAZIDE 25 MG PO TABS
50.0000 mg | ORAL_TABLET | Freq: Every day | ORAL | Status: DC
  Administered 2020-02-03 – 2020-02-05 (×3): 50 mg via ORAL
  Filled 2020-02-02 (×3): qty 2

## 2020-02-02 MED ORDER — SODIUM ZIRCONIUM CYCLOSILICATE 10 G PO PACK
10.0000 g | PACK | Freq: Once | ORAL | Status: AC
  Administered 2020-02-02: 10 g via ORAL
  Filled 2020-02-02: qty 1

## 2020-02-02 MED ORDER — FUROSEMIDE 10 MG/ML IJ SOLN
80.0000 mg | Freq: Once | INTRAMUSCULAR | Status: AC
  Administered 2020-02-02: 80 mg via INTRAVENOUS
  Filled 2020-02-02: qty 8

## 2020-02-02 MED ORDER — BUDESONIDE 0.5 MG/2ML IN SUSP
0.5000 mg | Freq: Every day | RESPIRATORY_TRACT | Status: DC
  Administered 2020-02-03 – 2020-02-11 (×9): 0.5 mg via RESPIRATORY_TRACT
  Filled 2020-02-02 (×9): qty 2

## 2020-02-02 MED ORDER — SIMVASTATIN 20 MG PO TABS
40.0000 mg | ORAL_TABLET | Freq: Every day | ORAL | Status: DC
  Administered 2020-02-02 – 2020-02-13 (×11): 40 mg via ORAL
  Filled 2020-02-02 (×4): qty 2
  Filled 2020-02-02: qty 4
  Filled 2020-02-02 (×8): qty 2

## 2020-02-02 MED ORDER — ACETAMINOPHEN 325 MG PO TABS
650.0000 mg | ORAL_TABLET | Freq: Four times a day (QID) | ORAL | Status: DC | PRN
  Administered 2020-02-04 – 2020-02-14 (×5): 650 mg via ORAL
  Filled 2020-02-02 (×5): qty 2

## 2020-02-02 MED ORDER — PREGABALIN 50 MG PO CAPS
50.0000 mg | ORAL_CAPSULE | Freq: Every day | ORAL | Status: DC
  Administered 2020-02-03 – 2020-02-07 (×5): 50 mg via ORAL
  Filled 2020-02-02 (×4): qty 1
  Filled 2020-02-02: qty 2

## 2020-02-02 MED ORDER — MOMETASONE FURO-FORMOTEROL FUM 200-5 MCG/ACT IN AERO
2.0000 | INHALATION_SPRAY | Freq: Two times a day (BID) | RESPIRATORY_TRACT | Status: DC
  Administered 2020-02-03 – 2020-02-11 (×15): 2 via RESPIRATORY_TRACT
  Filled 2020-02-02: qty 8.8

## 2020-02-02 MED ORDER — PREGABALIN 50 MG PO CAPS
100.0000 mg | ORAL_CAPSULE | Freq: Every day | ORAL | Status: DC
  Administered 2020-02-02 – 2020-02-13 (×11): 100 mg via ORAL
  Filled 2020-02-02 (×4): qty 2
  Filled 2020-02-02: qty 4
  Filled 2020-02-02 (×8): qty 2

## 2020-02-02 MED ORDER — LEVOTHYROXINE SODIUM 88 MCG PO TABS
88.0000 ug | ORAL_TABLET | Freq: Every day | ORAL | Status: DC
  Administered 2020-02-03: 88 ug via ORAL
  Filled 2020-02-02: qty 1

## 2020-02-02 MED ORDER — CLOPIDOGREL BISULFATE 75 MG PO TABS
75.0000 mg | ORAL_TABLET | Freq: Every day | ORAL | Status: DC
  Administered 2020-02-03 – 2020-02-14 (×12): 75 mg via ORAL
  Filled 2020-02-02 (×12): qty 1

## 2020-02-02 MED ORDER — DOXAZOSIN MESYLATE 8 MG PO TABS
8.0000 mg | ORAL_TABLET | Freq: Every day | ORAL | Status: DC
  Administered 2020-02-03 – 2020-02-13 (×10): 8 mg via ORAL
  Filled 2020-02-02 (×12): qty 1

## 2020-02-02 MED ORDER — ALBUTEROL SULFATE HFA 108 (90 BASE) MCG/ACT IN AERS
2.0000 | INHALATION_SPRAY | Freq: Four times a day (QID) | RESPIRATORY_TRACT | Status: DC | PRN
  Filled 2020-02-02: qty 6.7

## 2020-02-02 MED ORDER — MONTELUKAST SODIUM 10 MG PO TABS
10.0000 mg | ORAL_TABLET | Freq: Every day | ORAL | Status: DC
  Administered 2020-02-02 – 2020-02-13 (×11): 10 mg via ORAL
  Filled 2020-02-02 (×14): qty 1

## 2020-02-02 NOTE — H&P (Signed)
History and Physical    PLEASE NOTE THAT DRAGON DICTATION SOFTWARE WAS USED IN THE CONSTRUCTION OF THIS NOTE.   Amy [name] Lien LKG:[id number] DOB: 10-Nov-1927 DOA: 02/02/2020  PCP: [name], MD Patient coming from: home home  I have personally briefly reviewed patient's old medical records in [place]  Chief Complaint: Shortness of breath  HPI: Amy [name] is a 86 y.o. female with medical history significant for chronic diastolic heart failure with most recent echocardiogram in June 2021 showing LVEF 60 to 65% with grade 1 diastolic dysfunction, acquired hypothyroidism, stage IIIb chronic kidney disease with baseline creatinine 6.6-4.4 complicated by anemia of chronic kidney disease with baseline hemoglobin of 7.5-10, moderate persistent asthma, GERD, who is admitted to [facility] on 02/02/2020 with acute hypoxic respiratory distress in the setting of acute on chronic diastolic heart failure after presenting from home to [facility] Emergency Department complaining of shortness of breath.   The patient reports 2 to 3 days of progressive shortness of breath associated with orthopnea and new onset edema in the bilateral lower extremities.  She denies any associated chest pain, palpitations, diaphoresis, nausea, vomiting, presyncope.  She reports a mild nonproductive cough that started over that timeframe.  Denies any associated subjective fever, chills, rigors, or generalized myalgias.  Denies any new onset lower extremity erythema or calf tenderness.  No recent trauma, surgeries, or periods of diminished ambulatory activity.   She reports that up until the last 1 to 2 weeks, that her typical diuretic regimen consisted of torsemide 20 mg p.o. 2-3 times per week.  However, over the last 1 to 2 weeks, she reports that her outpatient providers adjusted her dose of torsemide such that she is now taking 20 mg p.o. every day, and conveys that she developed her  presenting to 3 days of progressive shortness of breath in spite of this up regulation in her outpatient diuretic.  Denies any recent headache, neck stiffness, rhinorrhea, rhinitis, sore throat,, diarrhea, no recent dysuria, gross hematuria, or change in urinary urgency/frequency.  Per chart review, most recent echocardiogram occurred in June 2021, and showed mild LVH, LVEF 60 to 65%, no evidence of focal wall motion abnormalities, and evidence of grade 1 diastolic dysfunction.    ED Course:  Vital signs in the ED were notable for the following: Initial oxygen saturation noted to be 88% on room air, which subsequently improved into the mid 90s on 2 L nasal cannula.  Temperature max 97.8; heart rate 56-63; blood pressure initially 161/53, which decreased to 124/43 following administration of IV diuretic, as further described below; respiratory rate initially noted to be in the 20s, which is subsequently improved to 15 following diuresis efforts.   Labs were notable for the following: BMP was notable for the following: Sodium 143, potassium 5.6, bicarbonate 28, BUN 65, creatinine 2.03 relative to baseline creatinine range of 1.8-2.0,.  Serum magnesium level 2.4.  BNP 617, which represented an increase relative to most recent prior value of 58 in June 2021.  CBC was notable for white blood cell count of 5300, hemoglobin 8.7 relative to baseline hemoglobin range of 7.5-10, and relative to most recent prior hemoglobin data point of 7.6 on 01/17/2020.  2 view chest x-ray showed evidence of bibasilar atelectasis and small bilateral pleural effusions in the absence of evidence of pneumothorax.  EKG showed sinus bradycardia with first-degree AV block heart rate 55, PR interval 270 MS, and nonspecific T wave inversion in V1, which appears unchanged  relative to most recent prior EKG in October 2021.  No evidence of ST changes, including no evidence of ST elevation.  Nasopharyngeal COVID-19/influenza PCR were  checked in the ED this evening and found to be negative.  While in the ED, the following were administered: In the context of presenting mild hyperkalemia, the following were administered in the ED this evening Lokelma, calcium gluconate 1 g IV x1.  Additionally, she received Lasix 80 mg IV x1.  Subsequently, the patient was admitted to the med telemetry floor for further evaluation and management of presenting acute hypoxic respiratory distress in the setting of acute on chronic diastolic failure.     Review of Systems: As per HPI otherwise 10 point review of systems negative.   Past Medical History:  Diagnosis Date  . Bladder incontinence   . CAD (coronary artery disease)    a. 05/2009 Cath: LAD 90p (Xience 2.75 x 12 mm DES), 79m, D1 40, LCx 40p/m, RCA 30/40/30p/m, RCA 30/25d;  b.  12/2011 Lexiscan MV: no ischemia, breast attenuation artifact, normal EF-->Low risk; c. 10/2016 MV: fixed apical defect, most likely apical thinning and attenuation, No ischemia, EF 60%.  . Cancer ([place])    ovarian  . Carotid arterial disease w/ R Carotid Bruit (HCC)    a. 08/2016 Carotid U/S: 40-59% bilat ICA stenosis - f/u 1 yr.  . Chronic diastolic (congestive) heart failure ([place])    a. Echo 09/2015: EF 55-60% w/ Grade 1 DD, sev Ca2+ MV annulus, mildly dil LA; b. 09/2016 Echo: EF 65-70%, Gr2 DD, mildly dil LA/RA, nl RV fxn.  . Chronic Dyspnea on exertion   . CKD (chronic kidney disease) stage 3, GFR 30-59 ml/min (HCC)   . Degenerative arthritis of knee    bilateral knees  . Diabetes mellitus    Type II  . GIB (gastrointestinal bleeding)    a. 08/2016 Admission w/ presyncope/anemia/melena-->req Transfusion-->endo ok, colonoscopy w/ polyps but no source of bleeding (most likely diverticular).  . Hiatal hernia   . Hypertension   . Iron deficiency   . Menopausal symptoms   . Morbid obesity ([place])   . Renal insufficiency   . Thyroid disease    hypothyroidism    Past Surgical History:  Procedure Laterality  Date  . COLONOSCOPY  2015  . COLONOSCOPY WITH PROPOFOL N/A 09/25/2016   Procedure: COLONOSCOPY WITH PROPOFOL;  Surgeon: [name], MD;  Location: [facility] ENDOSCOPY;  Service: Gastroenterology;  Laterality: N/A;  . COLONOSCOPY WITH PROPOFOL N/A 09/26/2016   Procedure: COLONOSCOPY WITH PROPOFOL;  Surgeon: [name], MD;  Location: [facility] ENDOSCOPY;  Service: Gastroenterology;  Laterality: N/A;  . ESOPHAGOGASTRODUODENOSCOPY (EGD) WITH PROPOFOL N/A 09/25/2016   Procedure: ESOPHAGOGASTRODUODENOSCOPY (EGD) WITH PROPOFOL;  Surgeon: [name], MD;  Location: [facility] ENDOSCOPY;  Service: Gastroenterology;  Laterality: N/A;  . RENAL ANGIOGRAPHY N/A 02/07/2019   Procedure: RENAL ANGIOGRAPHY;  Surgeon: [name], MD;  Location: [place] CV LAB;  Service: Cardiovascular;  Laterality: N/A;  . REPLACEMENT TOTAL KNEE BILATERAL    . TOTAL VAGINAL HYSTERECTOMY     ovarian mass, not cancerous  . UPPER GI ENDOSCOPY  2015    Social History:  reports that she has never smoked. She has never used smokeless tobacco. She reports that she does not drink alcohol and does not use drugs.   Allergies  Allergen Reactions  . Atenolol Other (See Comments)    Other reaction(s): Other (See Comments) Decreased heart rate Decreased heart rate  . Codeine Shortness Of Breath  Rash, difficulty breathing, nausea.  . Losartan Other (See Comments)    Hyperkalemia  . Morphine And Related Shortness Of Breath    Rash, difficulty breathing, nausea.   . Nsaids Other (See Comments)    Other reaction(s): Unknown  . Rofecoxib     Other reaction(s): Unknown  . Sucralfate Other (See Comments)    Throat tightness  . Sulfa Antibiotics     Other reaction(s): Unknown    Family History  Problem Relation Age of Onset  . Breast cancer Mother 21     Prior to Admission medications   Medication Sig Start Date End Date Taking? Authorizing Provider  albuterol (PROVENTIL HFA;VENTOLIN HFA) 108 (90 Base) MCG/ACT inhaler Inhale 2  puffs into the lungs every 6 (six) hours as needed for wheezing or shortness of breath.    [provider]  amitriptyline (ELAVIL) 10 MG tablet Take 10 mg by mouth at bedtime. 12/04/19   [provider]  Capsaicin 0.035 % CREA Apply to affected region up to 3 times a day 10/18/19   Gillis Santa, MD  cloNIDine (CATAPRES - DOSED IN MG/24 HR) 0.2 mg/24hr patch Place 1 patch (0.2 mg total) onto the skin once a week. 06/29/17   Alisa Graff, FNP  clopidogrel (PLAVIX) 75 MG tablet Take 1 tablet (75 mg total) by mouth daily. 09/27/16   Dustin Flock, MD  doxazosin (CARDURA) 8 MG tablet Take by mouth. 12/20/19   [provider]  hydrALAZINE (APRESOLINE) 100 MG tablet Take 100 mg by mouth 3 (three) times daily.  Patient not taking: Reported on 01/11/2020 05/21/19   [provider]  hydrochlorothiazide (HYDRODIURIL) 50 MG tablet Take by mouth. 01/10/20 01/09/21  [provider]  levothyroxine (SYNTHROID) 88 MCG tablet 88 mcg daily before breakfast.  06/20/19   [provider]  lisinopril (ZESTRIL) 2.5 MG tablet Take by mouth. 01/10/20 01/09/21  [provider]  montelukast (SINGULAIR) 10 MG tablet Take 10 mg by mouth at bedtime.  03/24/19   [provider]  nitroGLYCERIN (NITROSTAT) 0.4 MG SL tablet Place 1 tablet (0.4 mg total) under the tongue every 5 (five) minutes x 3 doses as needed for chest pain. Patient not taking: Reported on 01/11/2020 08/23/19   Enzo Bi, MD  oxybutynin (DITROPAN-XL) 5 MG 24 hr tablet Take 5 mg by mouth daily.     [provider]  oxyCODONE-acetaminophen (PERCOCET) 10-325 MG tablet Take 1 tablet by mouth every 8 (eight) hours as needed for pain. Patient not taking: Reported on 01/11/2020    [provider]  pantoprazole (PROTONIX) 40 MG tablet Take 20 mg by mouth daily.     [provider]  pregabalin (LYRICA) 50 MG capsule 50 mg during the day, 100 mg qhs 12/17/19   Gillis Santa, MD   simvastatin (ZOCOR) 40 MG tablet Take 1 tablet (40 mg total) by mouth at bedtime. 03/22/18   Minna Merritts, MD  SYMBICORT 160-4.5 MCG/ACT inhaler Inhale 2 puffs into the lungs 2 (two) times daily.  11/15/14   [provider]  torsemide (DEMADEX) 20 MG tablet Take 20 mg by mouth 3 (three) times a week. 09/21/19 10/23/19  [provider]     Objective    Physical Exam: Vitals:   02/02/20 1700 02/02/20 1730 02/02/20 1734 02/02/20 1738  BP: (!) 173/43 (!) 187/48    Pulse: (!) 56 60 60 (!) 56  Resp: 13 (!) 31 15 13   Temp:      SpO2: 100% 98%  100% 99%  Weight:      Height:        General: appears to be stated age; alert, oriented; slight increase in work of breathing noted Skin: warm, dry, no rash Head:  AT/[place] Mouth:  Oral mucosa membranes appear moist, normal dentition Neck: supple; trachea midline Heart:  RRR; did not appreciate any M/R/G Lungs: Bibasilar crackles noted; otherwise CTAB, did not appreciate any wheezes,or rhonchi Abdomen: + BS; soft, ND, NT Vascular: 2+ pedal pulses b/l; 2+ radial pulses b/l Extremities: Trace to 1+ edema in the bilateral lower extremities, no muscle wasting Neuro: strength and sensation intact in upper and lower extremities b/l   Labs on Admission: I have personally reviewed following labs and imaging studies  CBC: Recent Labs  Lab 02/02/20 1124 02/02/20 1221  WBC TEST WILL BE CREDITED 5.3  HGB TEST WILL BE CREDITED 8.7*  HCT TEST WILL BE CREDITED 27.8*  MCV TEST WILL BE CREDITED 98.9  PLT TEST WILL BE CREDITED 811*   Basic Metabolic Panel: Recent Labs  Lab 02/02/20 1221  NA 143  K 5.6*  CL 107  CO2 28  GLUCOSE 89  BUN 65*  CREATININE 2.03*  CALCIUM 8.3*  MG 2.4   GFR: Estimated Creatinine Clearance: 17.4 mL/min (A) (by C-G formula based on SCr of 2.03 mg/dL (H)). Liver Function Tests: No results for input(s): AST, ALT, ALKPHOS, BILITOT, PROT, ALBUMIN in the last 168 hours. No results for input(s):  LIPASE, AMYLASE in the last 168 hours. No results for input(s): AMMONIA in the last 168 hours. Coagulation Profile: Recent Labs  Lab 02/02/20 1124  INR 1.0   Cardiac Enzymes: No results for input(s): CKTOTAL, CKMB, CKMBINDEX, TROPONINI in the last 168 hours. BNP (last 3 results) No results for input(s): PROBNP in the last 8760 hours. HbA1C: No results for input(s): HGBA1C in the last 72 hours. CBG: No results for input(s): GLUCAP in the last 168 hours. Lipid Profile: No results for input(s): CHOL, HDL, LDLCALC, TRIG, CHOLHDL, LDLDIRECT in the last 72 hours. Thyroid Function Tests: No results for input(s): TSH, T4TOTAL, FREET4, T3FREE, THYROIDAB in the last 72 hours. Anemia Panel: No results for input(s): VITAMINB12, FOLATE, FERRITIN, TIBC, IRON, RETICCTPCT in the last 72 hours. Urine analysis:    Component Value Date/Time   COLORURINE YELLOW (A) 08/18/2019 0745   APPEARANCEUR CLEAR (A) 08/18/2019 0745   APPEARANCEUR Cloudy (A) 03/15/2019 0909   LABSPEC 1.014 08/18/2019 0745   LABSPEC 1.025 10/11/2013 1637   PHURINE 5.0 08/18/2019 0745   GLUCOSEU NEGATIVE 08/18/2019 0745   GLUCOSEU Negative 10/11/2013 1637   HGBUR NEGATIVE 08/18/2019 0745   BILIRUBINUR NEGATIVE 08/18/2019 0745   BILIRUBINUR Negative 03/15/2019 0909   BILIRUBINUR Negative 10/11/2013 1637   KETONESUR NEGATIVE 08/18/2019 0745   PROTEINUR NEGATIVE 08/18/2019 0745   NITRITE NEGATIVE 08/18/2019 0745   LEUKOCYTESUR NEGATIVE 08/18/2019 0745   LEUKOCYTESUR Negative 10/11/2013 1637    Radiological Exams on Admission: DG Chest 2 View  Result Date: 02/02/2020 CLINICAL DATA:  Short of breath, heart failure EXAM: CHEST - 2 VIEW COMPARISON:  08/18/2019 FINDINGS: Cardiac enlargement. Bibasilar atelectasis and small pleural effusions. Upper lobes clear. Atherosclerotic aortic arch. IMPRESSION: Bibasilar atelectasis and small pleural effusions bilaterally. Electronically Signed   By: Franchot Gallo M.D.   On: 02/02/2020  12:04    EKG: Independently reviewed, with result as described above.    Assessment/Plan  Amy [name] is a 86 y.o. female with medical history significant for chronic diastolic heart failure with most  recent echocardiogram in June 2021 showing LVEF 60 to 65% with grade 1 diastolic dysfunction, acquired hypothyroidism, stage IIIb chronic kidney disease with baseline creatinine 8.8-4.1 complicated by anemia of chronic kidney disease with baseline hemoglobin of 7.5-10, moderate persistent asthma, GERD, who is admitted to [facility] on 02/02/2020 with acute hypoxic respiratory distress in the setting of acute on chronic diastolic heart failure after presenting from home to [facility] Emergency Department complaining of shortness of breath.      Principal Problem:   Acute on chronic diastolic heart failure (HCC) Active Problems:   Acquired hypothyroidism   Hyperkalemia   Moderate persistent asthma   SOB (shortness of breath)    #) Acute hypoxic respiratory distress due to acute on chronic diastolic heart failure: In the setting of no baseline supplemental oxygen requirements any documented history of chronic diastolic, with most recent echocardiogram in June 2021, with results as above, the patient presents with 2 to 3 days of progressive shortness of breath associated orthopnea and worsening of peripheral edema in the bilateral lower extremities, with BNP demonstrating a tenfold elevation over most recent prior value, while chest x-ray shows evidence of small bilateral pleural effusions.  Initial oxygen saturation noted to be in the high 80s on room air, which is subsequently improved into the mid 90s on 2 L nasal cannula.  Her outpatient diuretic regimen is limited to torsemide, with recent up regulation of this loop diuretic to 20 mg p.o. daily relative to prior frequency of 20 mg every 2 to 3 days.  Etiology leading to the patient's acutely decompensated heart failure is  not completely clear at this time, however, want to keep an eye on her chronic anemia, as this certainly could represent a precipitating factor leading to an episode of acute decompensated heart failure.  At this time, hemoglobin appears to be within her baseline range, as further quantified above.  EKG showed no evidence of acute ischemic changes, and overall ACS is felt to be less likely at this time.  No radiographic or clinical evidence to suggest underlying pneumonia at this time, while COVID-19 PCR was found to be negative at presentation this evening.  At present, there does not appear to be any associated COPD exacerbation, although the patient is certainly at risk for development of such due to stresses conveyed acute on chronic diastolic heart failure.   Plan: I have ordered 1 additional dose of Lasix 40 mg IV x1 to occur at 8 AM tomorrow morning in order to continuation of additional a.m. data points to monitor renal and electrolyte response to Lasix 80 mg IV x1 administered in the ED this evening.  Additionally this will provide the opportunity to reevaluate volume status adjust ensuing diuretic approach on the basis of these updated findings.  Monitor on telemetry.  Monitor continuous pulse oximetry.  Repeat BMP and serum magnesium level in the morning.  Close monitoring of ensuing blood pressures to ensure adequate afterload reduction.  Check serum phosphorus level.     #) Hyperkalemia: Presenting potassium noted to be mildly elevated at 5.6 prompting administration of Lokelma as well as calcium gluconate 1 g IV x1 in the ED.  Of note, the patient is on lisinopril at home.  I do not believe she is on any potassium in the setting of her outpatient torsemide dose.  Plan: Will repeat serum potassium level closely interval response.  Monitor on telemetry.  Repeat serum magnesium level in the morning.  Monitor on telemetry.  Hold  home lisinopril.     #) Acquired hypothyroidism: On  supplemental thyroid hormone as an outpatient.  Plan: Continue present therapy.  Check TSH in the morning.     #) Stage IIIb chronic kidney disease: Associated with baseline creatinine range of 1.8-2.0,, with presenting serum creatinine noted to be close to that range, with presenting value 2.03, in spite of significant increase in dose of torsemide over the last couple weeks.  Per chart review, it appears that her chronic kidney disease is complicated with anemia of chronic kidney disease and associated hemoglobin range of 7.5-10, as further described below.  Plan: Monitor strict I's and O's and daily weights.  Close monitoring of ensuing renal function and response to up regulation and diuretic approach, as further described above.  Repeat BMP in the morning.  Work-up and management of anemia of chronic kidney disease, as further described below.     #) Anemia of chronic kidney disease: Associated with baseline hemoglobin of 7.5-10, with presenting hemoglobin 8.7, representing a slight increase from most recent prior value of 7.6 when checked on 01/17/2020.  No evidence of acute blood loss.  Will closely monitor ensuing hemoglobin levels as acute worsening of her chronic anemia level could precipitate an exacerbation of underlying heart failure, as further described above.  Plan: Repeat CBC in the morning.  Monitor on telemetry.      #) Moderate persistent asthma: Outpatient respiratory regimen includes the following: Pulmicort, Symbicort, as needed albuterol, and Singulair.  Presentation does not appear to be associate with an acute exacerbation of her asthma, but will subsequently monitor closely for development of such.  Plan  Continue home respiratory regimen. Continuous pulse oximetry.  Repeat serum magnesium level in the morning.     #) GERD: On Protonix as an outpatient.  Plan: Continue home PPI.     DVT prophylaxis: SCDs Code Status: DNR Family Communication:  none Disposition Plan: Per Rounding Team Consults called: none  Admission status: Inpatient; med telemetry.   Of note, this patient was added by me to the following Admit List/Treatment Team:  armcadmits     PLEASE NOTE THAT DRAGON DICTATION SOFTWARE WAS USED IN THE CONSTRUCTION OF THIS NOTE.   [facility] Pager [phone] From 12PM- 12AM  Otherwise, please contact night-coverage  [email or web address] Password [facility]  02/02/2020, 5:42 PM

## 2020-02-02 NOTE — ED Triage Notes (Signed)
Pt from [facility] with sob r/t heart failure; was told she needs aggressive diureses. Pt wheezy and breathless when speaking. Pt also c/o pain in her left arm due to a recent bout with shingles. Pt alert & oriented, nad noted.

## 2020-02-02 NOTE — ED Notes (Signed)
Took over care of pt. Pt resting comfortably and has no requests at this time. VSS. Awaiting further orders. Will continue to monitor.

## 2020-02-02 NOTE — ED Triage Notes (Signed)
Pt has gained 13 or 30# in last 2 weeks (daughter says 21, note from [facility] says 81).

## 2020-02-02 NOTE — ED Provider Notes (Signed)
[facility] Emergency Department Provider Note ____________________________________________   Event Date/Time   First MD Initiated Contact with Patient 02/02/20 1618     (approximate)  I have reviewed the triage vital signs and the nursing notes.  HISTORY  Chief Complaint Shortness of Breath (Heart failure)   HPI Amy [name] is a 86 y.o. femalewho presents to the ED for evaluation of shortness of breath and weight gain.   Chart review indicates hx patient saw her pulmonologist this morning.  Diastolic heart failure with preserved EF.  Moderate persistent asthma on Symbicort.  CAD on DAPT. Lives at home with Arthur. Ambulatory with walker.   Patient reports 1-2 weeks of acutely worsening chronic shortness of breath with associated diffuse soft tissue swelling, generalized weakness and orthopnea. She denies fevers, cough, productive cough, syncope, abdominal pain, emesis or dysuria. She reports using her torsemide daily for the past 2 weeks. She reports decreased urinary output without hematuria. Reports associated 30lb weight gain.    Past Medical History:  Diagnosis Date  . Bladder incontinence   . CAD (coronary artery disease)    a. 05/2009 Cath: LAD 90p (Xience 2.75 x 12 mm DES), 25m, D1 40, LCx 40p/m, RCA 30/40/30p/m, RCA 30/25d;  b.  12/2011 Lexiscan MV: no ischemia, breast attenuation artifact, normal EF-->Low risk; c. 10/2016 MV: fixed apical defect, most likely apical thinning and attenuation, No ischemia, EF 60%.  . Cancer ([place])    ovarian  . Carotid arterial disease w/ R Carotid Bruit (HCC)    a. 08/2016 Carotid U/S: 40-59% bilat ICA stenosis - f/u 1 yr.  . Chronic diastolic (congestive) heart failure ([place])    a. Echo 09/2015: EF 55-60% w/ Grade 1 DD, sev Ca2+ MV annulus, mildly dil LA; b. 09/2016 Echo: EF 65-70%, Gr2 DD, mildly dil LA/RA, nl RV fxn.  . Chronic Dyspnea on exertion   . CKD (chronic kidney disease) stage 3, GFR 30-59  ml/min (HCC)   . Degenerative arthritis of knee    bilateral knees  . Diabetes mellitus    Type II  . GIB (gastrointestinal bleeding)    a. 08/2016 Admission w/ presyncope/anemia/melena-->req Transfusion-->endo ok, colonoscopy w/ polyps but no source of bleeding (most likely diverticular).  . Hiatal hernia   . Hypertension   . Iron deficiency   . Menopausal symptoms   . Morbid obesity ([place])   . Renal insufficiency   . Thyroid disease    hypothyroidism    Patient Active Problem List   Diagnosis Date Noted  . Anemia associated with chronic renal failure 10/22/2019  . Postherpetic neuralgia 10/18/2019  . Neuropathic pain of chest 10/18/2019  . Chronic pain syndrome 10/18/2019  . NSTEMI (non-ST elevated myocardial infarction) ([place]) 08/18/2019  . Syncope and collapse 08/13/2019  . Unresponsiveness 08/12/2019  . Hyperkalemia 08/12/2019  . HTN (hypertension) 08/12/2019  . GERD (gastroesophageal reflux disease) 08/12/2019  . Hypothyroidism 08/12/2019  . CAD (coronary artery disease) 08/12/2019  . Shingles 08/12/2019  . Carotid arterial disease ([place])   . Hypotension   . Groin pain, right   . Chest pain 08/06/2019  . Renovascular hypertension 03/08/2019  . Renal artery stenosis ([place]) 01/28/2019  . Multiple renal cysts 12/29/2018  . Renal stone 12/29/2018  . CKD (chronic kidney disease) stage 4, GFR 15-29 ml/min (HCC) 11/23/2018  . Diabetes ([place]) 06/29/2017  . CAP (community acquired pneumonia) 05/16/2017  . GI bleed 09/21/2016  . Vitamin D deficiency 11/04/2015  . Multiple lung nodules   . COPD  with chronic bronchitis ([employer])   . New onset atrial fibrillation ([place]) 10/04/2015  . Anemia 10/04/2015  . Acute kidney injury superimposed on CKD ([place]) 10/03/2015  . Palliative care encounter 06/05/2015  . Morbid obesity ([place]) 12/05/2014  . Angina pectoris associated with type 2 diabetes mellitus ([name]) 12/05/2014  . Type 2 diabetes mellitus with hyperglycemia ([place]) 08/22/2014  .  Chronic diastolic CHF (congestive heart failure) ([place]) 09/06/2013  . Preop cardiovascular exam 06/03/2012  . Iron deficiency anemia 09/08/2011  . HLD (hyperlipidemia) 07/18/2009  . Coronary artery disease involving native coronary artery of native heart without angina pectoris 07/18/2009  . HYPOTHYROIDISM-IATROGENIC 07/12/2009  . Essential hypertension 07/12/2009    Past Surgical History:  Procedure Laterality Date  . COLONOSCOPY  2015  . COLONOSCOPY WITH PROPOFOL N/A 09/25/2016   Procedure: COLONOSCOPY WITH PROPOFOL;  Surgeon: [name], MD;  Location: [facility] ENDOSCOPY;  Service: Gastroenterology;  Laterality: N/A;  . COLONOSCOPY WITH PROPOFOL N/A 09/26/2016   Procedure: COLONOSCOPY WITH PROPOFOL;  Surgeon: [name], MD;  Location[facility] ENDOSCOPY;  Service: Gastroenterology;  Laterality: N/A;  . ESOPHAGOGASTRODUODENOSCOPY (EGD) WITH PROPOFOL N/A 09/25/2016   Procedure: ESOPHAGOGASTRODUODENOSCOPY (EGD) WITH PROPOFOL;  Surgeon: [name], MD;  Location: [facility] ENDOSCOPY;  Service: Gastroenterology;  Laterality: N/A;  . RENAL ANGIOGRAPHY N/A 02/07/2019   Procedure: RENAL ANGIOGRAPHY;  Surgeon: [name], MD;  Location: [place] CV LAB;  Service: Cardiovascular;  Laterality: N/A;  . REPLACEMENT TOTAL KNEE BILATERAL    . TOTAL VAGINAL HYSTERECTOMY     ovarian mass, not cancerous  . UPPER GI ENDOSCOPY  2015    Prior to Admission medications   Medication Sig Start Date End Date Taking? Authorizing Provider  albuterol (PROVENTIL HFA;VENTOLIN HFA) 108 (90 Base) MCG/ACT inhaler Inhale 2 puffs into the lungs every 6 (six) hours as needed for wheezing or shortness of breath.    [provider]  amitriptyline (ELAVIL) 10 MG tablet Take 10 mg by mouth at bedtime. 12/04/19   [provider]  Capsaicin 0.035 % CREA Apply to affected region up to 3 times a day 10/18/19   Gillis Santa, MD  cloNIDine (CATAPRES - DOSED IN MG/24 HR) 0.2 mg/24hr patch Place 1 patch (0.2 mg total)  onto the skin once a week. 06/29/17   Alisa Graff, FNP  clopidogrel (PLAVIX) 75 MG tablet Take 1 tablet (75 mg total) by mouth daily. 09/27/16   Dustin Flock, MD  doxazosin (CARDURA) 8 MG tablet Take by mouth. 12/20/19   [provider]  hydrALAZINE (APRESOLINE) 100 MG tablet Take 100 mg by mouth 3 (three) times daily.  Patient not taking: Reported on 01/11/2020 05/21/19   [provider]  hydrochlorothiazide (HYDRODIURIL) 50 MG tablet Take by mouth. 01/10/20 01/09/21  [provider]  levothyroxine (SYNTHROID) 88 MCG tablet 88 mcg daily before breakfast.  06/20/19   [provider]  lisinopril (ZESTRIL) 2.5 MG tablet Take by mouth. 01/10/20 01/09/21  [provider]  montelukast (SINGULAIR) 10 MG tablet Take 10 mg by mouth at bedtime.  03/24/19   [provider]  nitroGLYCERIN (NITROSTAT) 0.4 MG SL tablet Place 1 tablet (0.4 mg total) under the tongue every 5 (five) minutes x 3 doses as needed for chest pain. Patient not taking: Reported on 01/11/2020 08/23/19   Enzo Bi, MD  oxybutynin (DITROPAN-XL) 5 MG 24 hr tablet Take 5 mg by mouth daily.     [provider]  oxyCODONE-acetaminophen (PERCOCET) 10-325 MG tablet Take 1 tablet by mouth every  8 (eight) hours as needed for pain. Patient not taking: Reported on 01/11/2020    [provider]  pantoprazole (PROTONIX) 40 MG tablet Take 20 mg by mouth daily.     [provider]  pregabalin (LYRICA) 50 MG capsule 50 mg during the day, 100 mg qhs 12/17/19   Gillis Santa, MD  simvastatin (ZOCOR) 40 MG tablet Take 1 tablet (40 mg total) by mouth at bedtime. 03/22/18   Minna Merritts, MD  SYMBICORT 160-4.5 MCG/ACT inhaler Inhale 2 puffs into the lungs 2 (two) times daily.  11/15/14   [provider]  torsemide (DEMADEX) 20 MG tablet Take 20 mg by mouth 3 (three) times a week. 09/21/19 10/23/19  [provider]    Allergies Atenolol, Codeine, Losartan,  Morphine and related, Nsaids, Rofecoxib, Sucralfate, and Sulfa antibiotics  Family History  Problem Relation Age of Onset  . Breast cancer Mother 83    Social History Social History   Tobacco Use  . Smoking status: Never Smoker  . Smokeless tobacco: Never Used  Vaping Use  . Vaping Use: Never used  Substance Use Topics  . Alcohol use: No  . Drug use: No    Review of Systems  Constitutional: No fever/chills. Positive for generalized weakness Eyes: No visual changes. ENT: No sore throat. Cardiovascular: Denies chest pain. Respiratory: positive for orthopnea and shortness of breath. Gastrointestinal: No abdominal pain.  No nausea, no vomiting.  No diarrhea.  No constipation. Genitourinary: Negative for dysuria. Musculoskeletal: Negative for back pain. Skin: Negative for rash. Neurological: Negative for headaches, focal weakness or numbness.  ____________________________________________   PHYSICAL EXAM:  VITAL SIGNS: Vitals:   02/02/20 1542 02/02/20 1622  BP: (!) 178/54 (!) 183/55  Pulse: (!) 59 (!) 56  Resp: (!) 22 (!) 22  Temp:  97.7 F (36.5 C)  SpO2: 93% 99%     Constitutional: Alert and oriented. Obese. Sitting upright in bed. Uncomfortable-appearing. Conversational in phrases only. Anasarca is present.  Eyes: Conjunctivae are normal. PERRL. EOMI. Head: Atraumatic. Nose: No congestion/rhinnorhea. Mouth/Throat: Mucous membranes are moist.  Oropharynx non-erythematous. Neck: No stridor. No cervical spine tenderness to palpation. Cardiovascular: Normal rate, regular rhythm. Grossly normal heart sounds.  Good peripheral circulation. Respiratory: Mild tachypnea to the low 20s.  Conversational dyspnea.  Bibasilar crackles are present.  Otherwise clear.. Gastrointestinal: Soft , nondistended, nontender to palpation. No CVA tenderness. Musculoskeletal:  No joint effusions. No signs of acute trauma. Diffuse pitting edema to lower extremities without overlying skin  changes Neurologic:  Normal speech and language. No gross focal neurologic deficits are appreciated.  Skin:  Skin is warm, dry and intact. No rash noted. Psychiatric: Mood and affect are normal. Speech and behavior are normal.  ____________________________________________   LABS (all labs ordered are listed, but only abnormal results are displayed)  Labs Reviewed  CBC - Abnormal; Notable for the following components:      Result Value   RBC 2.81 (*)    Hemoglobin 8.7 (*)    HCT 27.8 (*)    RDW 17.3 (*)    Platelets 145 (*)    All other components within normal limits  BASIC METABOLIC PANEL - Abnormal; Notable for the following components:   Potassium 5.6 (*)    BUN 65 (*)    Creatinine, Ser 2.03 (*)    Calcium 8.3 (*)    GFR, Estimated 23 (*)    All other components within normal limits  BRAIN NATRIURETIC PEPTIDE - Abnormal; Notable for the following  components:   B Natriuretic Peptide 617.7 (*)    All other components within normal limits  RESP PANEL BY RT-PCR (FLU A&B, COVID) ARPGX2  CBC  PROTIME-INR  MAGNESIUM   ____________________________________________  12 Lead EKG  Sinus rhythm, rate of 55 bpm.  Normal axis.  First-degree AV block with PR interval of 270 ms and a right bundle branch block.  No evidence of acute ischemia. ____________________________________________  RADIOLOGY  ED MD interpretation:  2 view CXR reviewed by me with pulmonary vascular congestion and small bilateral pleural effusions.   Official radiology report(s): DG Chest 2 View  Result Date: 02/02/2020 CLINICAL DATA:  Short of breath, heart failure EXAM: CHEST - 2 VIEW COMPARISON:  08/18/2019 FINDINGS: Cardiac enlargement. Bibasilar atelectasis and small pleural effusions. Upper lobes clear. Atherosclerotic aortic arch. IMPRESSION: Bibasilar atelectasis and small pleural effusions bilaterally. Electronically Signed   By: Franchot Gallo M.D.   On: 02/02/2020 12:04     ____________________________________________   PROCEDURES and INTERVENTIONS  Procedure(s) performed (including Critical Care):  .1-3 Lead EKG Interpretation Performed by: Vladimir Crofts, MD Authorized by: Vladimir Crofts, MD     Interpretation: normal     ECG rate:  62   ECG rate assessment: normal     Rhythm: sinus rhythm     Ectopy: PVCs     Conduction: normal      Medications  calcium gluconate 1 g/ 50 mL sodium chloride IVPB (1,000 mg Intravenous New Bag/Given 02/02/20 1711)  sodium zirconium cyclosilicate (LOKELMA) packet 10 g (has no administration in time range)  furosemide (LASIX) injection 80 mg (80 mg Intravenous Given 02/02/20 1705)    ____________________________________________   MDM / ED COURSE   86 year old woman presents from home with worsening symptoms of CHF exacerbation requiring medical admission.  Patient hypoxic to the mid/upper 80s on room air necessitating nasal cannula, otherwise stable.  Exam demonstrates an obese patient with anasarca, dyspnea and tachypnea with bibasilar crackles.  She is certainly volume overloaded.  Blood work with mild AKI concerning for component of cardiorenal syndrome.  CXR shows no infiltrates to suggest superimposed bacterial pneumonia.  Blood work with elevated BNP suggestive of CHF exacerbation.  She has no evidence of trauma, neurovascular deficits or significant distress.  We will initiate diuresis with 80 mg of IV Lasix and admit the patient to hospitalist medicine for further work-up and management.    ____________________________________________   FINAL CLINICAL IMPRESSION(S) / ED DIAGNOSES  Final diagnoses:  Acute on chronic diastolic congestive heart failure (HCC)  SOB (shortness of breath)  Hypoxia  Anasarca     ED Discharge Orders    None       Baleigh Rennaker   Note:  This document was prepared using Set designer software and may include unintentional dictation errors.   Vladimir Crofts,  MD 02/02/20 1728

## 2020-02-02 NOTE — ED Notes (Signed)
Pts room air saturation was 88% on RA. Pts oxygen replaced at 2lpm. Anderson [place], RN notified.

## 2020-02-02 NOTE — ED Notes (Signed)
Alert and oriented able to pivot to chair.  Took to bathroom to void on way to room.  To room and placed on monitor.  Her pulse ox is 89-90 on room air after exertion of getting into the bed and she appeared short of breath.   Placed her on 2 liters oxygen and ox came right up to 97% in a few minutes.  Her shortness of breath is better after resting a few minutes.  Says she has had 30 lb wt gain in last couple weeks.

## 2020-02-03 ENCOUNTER — Other Ambulatory Visit: Payer: Self-pay

## 2020-02-03 LAB — CBC
HCT: 25.4 % — ABNORMAL LOW (ref 36.0–46.0)
Hemoglobin: 7.6 g/dL — ABNORMAL LOW (ref 12.0–15.0)
MCH: 30.4 pg (ref 26.0–34.0)
MCHC: 29.9 g/dL — ABNORMAL LOW (ref 30.0–36.0)
MCV: 101.6 fL — ABNORMAL HIGH (ref 80.0–100.0)
Platelets: 130 10*3/uL — ABNORMAL LOW (ref 150–400)
RBC: 2.5 MIL/uL — ABNORMAL LOW (ref 3.87–5.11)
RDW: 17.2 % — ABNORMAL HIGH (ref 11.5–15.5)
WBC: 3.8 10*3/uL — ABNORMAL LOW (ref 4.0–10.5)
nRBC: 0 % (ref 0.0–0.2)

## 2020-02-03 LAB — COMPREHENSIVE METABOLIC PANEL
ALT: 12 U/L (ref 0–44)
AST: 15 U/L (ref 15–41)
Albumin: 2.7 g/dL — ABNORMAL LOW (ref 3.5–5.0)
Alkaline Phosphatase: 66 U/L (ref 38–126)
Anion gap: 5 (ref 5–15)
BUN: 64 mg/dL — ABNORMAL HIGH (ref 8–23)
CO2: 31 mmol/L (ref 22–32)
Calcium: 8.1 mg/dL — ABNORMAL LOW (ref 8.9–10.3)
Chloride: 108 mmol/L (ref 98–111)
Creatinine, Ser: 1.94 mg/dL — ABNORMAL HIGH (ref 0.44–1.00)
GFR, Estimated: 24 mL/min — ABNORMAL LOW (ref >60)
Glucose, Bld: 93 mg/dL (ref 70–99)
Potassium: 5 mmol/L (ref 3.5–5.1)
Sodium: 144 mmol/L (ref 135–145)
Total Bilirubin: 0.6 mg/dL (ref 0.3–1.2)
Total Protein: 5.1 g/dL — ABNORMAL LOW (ref 6.5–8.1)

## 2020-02-03 LAB — PHOSPHORUS: Phosphorus: 5.1 mg/dL — ABNORMAL HIGH (ref 2.5–4.6)

## 2020-02-03 LAB — TSH: TSH: 19.762 u[IU]/mL — ABNORMAL HIGH (ref 0.350–4.500)

## 2020-02-03 LAB — MAGNESIUM: Magnesium: 2.3 mg/dL (ref 1.7–2.4)

## 2020-02-03 MED ORDER — FUROSEMIDE 10 MG/ML IJ SOLN
60.0000 mg | Freq: Two times a day (BID) | INTRAMUSCULAR | Status: DC
  Administered 2020-02-03 – 2020-02-05 (×4): 60 mg via INTRAVENOUS
  Filled 2020-02-03 (×4): qty 6

## 2020-02-03 MED ORDER — LEVOTHYROXINE SODIUM 100 MCG PO TABS
100.0000 ug | ORAL_TABLET | Freq: Every day | ORAL | Status: DC
  Administered 2020-02-04 – 2020-02-14 (×11): 100 ug via ORAL
  Filled 2020-02-03 (×11): qty 1

## 2020-02-03 NOTE — Plan of Care (Signed)
  Problem: Health Behavior/Discharge Planning: Goal: Ability to manage health-related needs will improve Outcome: Progressing   

## 2020-02-03 NOTE — ED Notes (Signed)
Pt incontinent of urine and external catheter was out of place. Pt cleaned and peri care provided. Pt in NAD at this time. VSS. Awaiting further orders. Will continue to monitor.

## 2020-02-03 NOTE — Progress Notes (Addendum)
Progress Note    Amy [name]  GUR:[id number] DOB: [birth year]  DOA: 02/02/2020 PCP: [name], MD      Brief Narrative:    Medical records reviewed and are as summarized below:  Amy [name] is a 86 y.o. female       Assessment/Plan:   Principal Problem:   Acute on chronic diastolic heart failure ([place]) Active Problems:   Acquired hypothyroidism   Hyperkalemia   Moderate persistent asthma   SOB (shortness of breath)   Body mass index is 35.15 kg/m.  (Morbid obesity)  Acute on chronic diastolic CHF with anasarca: Continue IV Lasix.  Monitor BMP, daily weight and urine output.  2D echo done in June 2021 showed EF estimated at 60 to 65%, moderate to severe LVH.  Acute hypoxic respiratory failure: She is on 2 L/min oxygen via nasal cannula.  Taper off oxygen as able.  CKD stage IV: Creatinine is stable.  Monitor BMP.  Hyperkalemia: Improved.  Hypothyroidism: TSH is 19.762.  Increase Synthroid from 88 mcg to 100 mcg daily.  Hypertension: Continue antihypertensives  Moderate persistent asthma: Continue bronchodilators.   Diet Order            Diet Heart Room service appropriate? Yes; Fluid consistency: Thin  Diet effective now                    Consultants:  None  Procedures:  None    Medications:   . amitriptyline  10 mg Oral QHS  . budesonide  0.5 mg Inhalation Daily  . clopidogrel  75 mg Oral Daily  . doxazosin  8 mg Oral Daily  . furosemide  60 mg Intravenous BID  . hydrochlorothiazide  50 mg Oral Daily  . [START ON 02/04/2020] levothyroxine  100 mcg Oral Q0600  . mometasone-formoterol  2 puff Inhalation BID  . montelukast  10 mg Oral QHS  . pantoprazole  20 mg Oral Daily  . pregabalin  100 mg Oral QHS  . pregabalin  50 mg Oral Daily  . simvastatin  40 mg Oral QHS   Continuous Infusions:   Anti-infectives (From admission, onward)   None             Family Communication/Anticipated D/C date and  plan/Code Status   DVT prophylaxis: SCDs Start: 02/02/20 2243     Code Status: DNR  Family Communication: Plan discussed with her daughter, [name], over the phone. Disposition Plan:    Status is: Inpatient  Remains inpatient appropriate because:IV treatments appropriate due to intensity of illness or inability to take PO   Dispo: The patient is from: Home              Anticipated d/c is to: Home              Anticipated d/c date is: 2 days              Patient currently is not medically stable to d/c.           Subjective:   C/o generalized weakness and swollen legs  Objective:    Vitals:   02/03/20 0530 02/03/20 0600 02/03/20 0630 02/03/20 1000  BP: (!) 135/42 (!) 139/40 (!) 149/86   Pulse: (!) 50 (!) 51 (!) 54   Resp: 11 11 14 15   Temp:      SpO2: 100% 100% 100%   Weight:      Height:       No data  found.   Intake/Output Summary (Last 24 hours) at 02/03/2020 1129 Last data filed at 02/03/2020 0730 Gross per 24 hour  Intake --  Output 700 ml  Net -700 ml   Filed Weights   02/02/20 1104  Weight: 81.6 kg    Exam:  GEN: NAD SKIN: Warm and dry EYES: No pallor or icterus ENT: MMM CV: RRR PULM: CTA B ABD: soft, distended, NT, +BS CNS: AAO x 3, non focal EXT: Significant edema from b/l thighs to b/l feet, no tenderness   Data Reviewed:   I have personally reviewed following labs and imaging studies:  Labs: Labs show the following:   Basic Metabolic Panel: Recent Labs  Lab 02/02/20 1221 02/03/20 0315  NA 143 144  K 5.6* 5.0  CL 107 108  CO2 28 31  GLUCOSE 89 93  BUN 65* 64*  CREATININE 2.03* 1.94*  CALCIUM 8.3* 8.1*  MG 2.4 2.3  PHOS  --  5.1*   GFR Estimated Creatinine Clearance: 18.2 mL/min (A) (by C-G formula based on SCr of 1.94 mg/dL (H)). Liver Function Tests: Recent Labs  Lab 02/03/20 0315  AST 15  ALT 12  ALKPHOS 66  BILITOT 0.6  PROT 5.1*  ALBUMIN 2.7*   No results for input(s): LIPASE, AMYLASE in the last  168 hours. No results for input(s): AMMONIA in the last 168 hours. Coagulation profile Recent Labs  Lab 02/02/20 1124  INR 1.0    CBC: Recent Labs  Lab 02/02/20 1124 02/02/20 1221 02/03/20 0315  WBC TEST WILL BE CREDITED 5.3 3.8*  HGB TEST WILL BE CREDITED 8.7* 7.6*  HCT TEST WILL BE CREDITED 27.8* 25.4*  MCV TEST WILL BE CREDITED 98.9 101.6*  PLT TEST WILL BE CREDITED 145* 130*   Cardiac Enzymes: No results for input(s): CKTOTAL, CKMB, CKMBINDEX, TROPONINI in the last 168 hours. BNP (last 3 results) No results for input(s): PROBNP in the last 8760 hours. CBG: No results for input(s): GLUCAP in the last 168 hours. D-Dimer: No results for input(s): DDIMER in the last 72 hours. Hgb A1c: No results for input(s): HGBA1C in the last 72 hours. Lipid Profile: No results for input(s): CHOL, HDL, LDLCALC, TRIG, CHOLHDL, LDLDIRECT in the last 72 hours. Thyroid function studies: Recent Labs    02/03/20 0315  TSH 19.762*   Anemia work up: No results for input(s): VITAMINB12, FOLATE, FERRITIN, TIBC, IRON, RETICCTPCT in the last 72 hours. Sepsis Labs: Recent Labs  Lab 02/02/20 1124 02/02/20 1221 02/03/20 0315  WBC TEST WILL BE CREDITED 5.3 3.8*    Microbiology Recent Results (from the past 240 hour(s))  Resp Panel by RT-PCR (Flu A&B, Covid) Nasopharyngeal Swab     Status: None   Collection Time: 02/02/20  4:21 PM   Specimen: Nasopharyngeal Swab; Nasopharyngeal(NP) swabs in vial transport medium  Result Value Ref Range Status   SARS Coronavirus 2 by RT PCR NEGATIVE NEGATIVE Final    Comment: (NOTE) SARS-CoV-2 target nucleic acids are NOT DETECTED.  The SARS-CoV-2 RNA is generally detectable in upper respiratory specimens during the acute phase of infection. The lowest concentration of SARS-CoV-2 viral copies this assay can detect is 138 copies/mL. A negative result does not preclude SARS-Cov-2 infection and should not be used as the sole basis for treatment or other  patient management decisions. A negative result may occur with  improper specimen collection/handling, submission of specimen other than nasopharyngeal swab, presence of viral mutation(s) within the areas targeted by this assay, and inadequate number of viral copies(<138  copies/mL). A negative result must be combined with clinical observations, patient history, and epidemiological information. The expected result is Negative.  Fact Sheet for Patients:  EntrepreneurPulse.com.au  Fact Sheet for Healthcare Providers:  IncredibleEmployment.be  This test is no t yet approved or cleared by the [place] FDA and  has been authorized for detection and/or diagnosis of SARS-CoV-2 by FDA under an Emergency Use Authorization (EUA). This EUA will remain  in effect (meaning this test can be used) for the duration of the COVID-19 declaration under Section 564(b)(1) of the Act, 21 U.S.C.section 360bbb-3(b)(1), unless the authorization is terminated  or revoked sooner.       Influenza A by PCR NEGATIVE NEGATIVE Final   Influenza B by PCR NEGATIVE NEGATIVE Final    Comment: (NOTE) The Xpert Xpress SARS-CoV-2/FLU/RSV plus assay is intended as an aid in the diagnosis of influenza from Nasopharyngeal swab specimens and should not be used as a sole basis for treatment. Nasal washings and aspirates are unacceptable for Xpert Xpress SARS-CoV-2/FLU/RSV testing.  Fact Sheet for Patients: EntrepreneurPulse.com.au  Fact Sheet for Healthcare Providers: IncredibleEmployment.be  This test is not yet approved or cleared by the [place] FDA and has been authorized for detection and/or diagnosis of SARS-CoV-2 by FDA under an Emergency Use Authorization (EUA). This EUA will remain in effect (meaning this test can be used) for the duration of the COVID-19 declaration under Section 564(b)(1) of the Act, 21 U.S.C. section  360bbb-3(b)(1), unless the authorization is terminated or revoked.  Performed at [facility], [address] [place], [place] [address]     Procedures and diagnostic studies:  DG Chest 2 View  Result Date: 02/02/2020 CLINICAL DATA:  Short of breath, heart failure EXAM: CHEST - 2 VIEW COMPARISON:  08/18/2019 FINDINGS: Cardiac enlargement. Bibasilar atelectasis and small pleural effusions. Upper lobes clear. Atherosclerotic aortic arch. IMPRESSION: Bibasilar atelectasis and small pleural effusions bilaterally. Electronically Signed   By: Franchot Gallo M.D.   On: 02/02/2020 12:04               LOS: 1 day   Mahek Schlesinger  [facility]   Pager on [email or web address]. If 7PM-7AM, please contact night-coverage at [email or web address]     02/03/2020, 11:29 AM

## 2020-02-03 NOTE — Progress Notes (Signed)
   Heart Failure Nurse Navigator Note  Attempted to see the patient while in the emergency room when I visited she was sound asleep on her side in no acute distress.    We will try another time.    Pricilla Riffle RN, CHFN

## 2020-02-03 NOTE — Progress Notes (Signed)
Pt off unit via gurney in NAD. Respirations even and non-labored - O2 infusing at 3LPM via N/C. Pt is being transferred to 2A room 31, daughter [name]) aware.

## 2020-02-04 LAB — FERRITIN: Ferritin: 97 ng/mL (ref 11–307)

## 2020-02-04 LAB — IRON AND TIBC
Iron: 21 ug/dL — ABNORMAL LOW (ref 28–170)
Saturation Ratios: 9 % — ABNORMAL LOW (ref 10.4–31.8)
TIBC: 228 ug/dL — ABNORMAL LOW (ref 250–450)
UIBC: 207 ug/dL

## 2020-02-04 NOTE — Progress Notes (Deleted)
[place]  [phone] [phone]  ID: Amy [name] OB: [birth year]  MR#: [id number]  AST#:[id number]  Patient Care Team: [name], MD as PCP - General (Internal Medicine) Rockey Situ Kathlene November, MD as PCP - Cardiology (Cardiology) Minna Merritts, MD as Consulting Physician (Cardiology)  CHIEF COMPLAINT: Anemia secondary to chronic renal insufficiency.  INTERVAL HISTORY: Patient returns to clinic today for repeat laboratory work, further evaluation, and continuation of Retacrit. She has noticed increased weakness and fatigue recently. She also has to have continued, intermittent pain from her shingles. She has no neurologic complaints. She denies any chest pain, shortness of breath, cough, or hemoptysis.  She denies any nausea, vomiting, constipation, or diarrhea.  She has no urinary complaints. Patient offers no further specific complaints today.  REVIEW OF SYSTEMS:   Review of Systems  Constitutional: Positive for malaise/fatigue. Negative for fever and weight loss.  HENT: Negative for congestion and sore throat.   Respiratory: Negative.  Negative for cough, hemoptysis, shortness of breath and stridor.   Cardiovascular: Negative.  Negative for chest pain and leg swelling.  Gastrointestinal: Negative for abdominal pain, blood in stool and melena.  Genitourinary: Negative.  Negative for hematuria.  Musculoskeletal: Positive for myalgias. Negative for back pain and joint pain.  Skin: Negative.  Negative for rash.  Neurological: Positive for weakness. Negative for sensory change, focal weakness and headaches.  Psychiatric/Behavioral: Negative.  The patient is not nervous/anxious.     As per HPI. Otherwise, a complete review of systems is negative.  PAST MEDICAL HISTORY: Past Medical History:  Diagnosis Date  . Bladder incontinence   . CAD (coronary artery disease)    a. 05/2009 Cath: LAD 90p (Xience 2.75 x 12 mm DES), 70m, D1 40,  LCx 40p/m, RCA 30/40/30p/m, RCA 30/25d;  b.  12/2011 Lexiscan MV: no ischemia, breast attenuation artifact, normal EF-->Low risk; c. 10/2016 MV: fixed apical defect, most likely apical thinning and attenuation, No ischemia, EF 60%.  . Cancer ([place])    ovarian  . Carotid arterial disease w/ R Carotid Bruit (HCC)    a. 08/2016 Carotid U/S: 40-59% bilat ICA stenosis - f/u 1 yr.  . Chronic diastolic (congestive) heart failure ([place])    a. Echo 09/2015: EF 55-60% w/ Grade 1 DD, sev Ca2+ MV annulus, mildly dil LA; b. 09/2016 Echo: EF 65-70%, Gr2 DD, mildly dil LA/RA, nl RV fxn.  . Chronic Dyspnea on exertion   . CKD (chronic kidney disease) stage 3, GFR 30-59 ml/min (HCC)   . Degenerative arthritis of knee    bilateral knees  . Diabetes mellitus    Type II  . GIB (gastrointestinal bleeding)    a. 08/2016 Admission w/ presyncope/anemia/melena-->req Transfusion-->endo ok, colonoscopy w/ polyps but no source of bleeding (most likely diverticular).  . Hiatal hernia   . Hypertension   . Iron deficiency   . Menopausal symptoms   . Morbid obesity ([place])   . Renal insufficiency   . Thyroid disease    hypothyroidism    PAST SURGICAL HISTORY: Past Surgical History:  Procedure Laterality Date  . COLONOSCOPY  2015  . COLONOSCOPY WITH PROPOFOL N/A 09/25/2016   Procedure: COLONOSCOPY WITH PROPOFOL;  Surgeon: [name], MD;  Location: [facility] ENDOSCOPY;  Service: Gastroenterology;  Laterality: N/A;  . COLONOSCOPY WITH PROPOFOL N/A 09/26/2016   Procedure: COLONOSCOPY WITH PROPOFOL;  Surgeon: [name], MD;  Location: [facility] ENDOSCOPY;  Service: Gastroenterology;  Laterality: N/A;  . ESOPHAGOGASTRODUODENOSCOPY (EGD) WITH PROPOFOL N/A 09/25/2016  Procedure: ESOPHAGOGASTRODUODENOSCOPY (EGD) WITH PROPOFOL;  Surgeon: [name], MD;  Location: [facility] ENDOSCOPY;  Service: Gastroenterology;  Laterality: N/A;  . RENAL ANGIOGRAPHY N/A 02/07/2019   Procedure: RENAL ANGIOGRAPHY;  Surgeon: [name], MD;  Location: [facility] CV LAB;  Service: Cardiovascular;  Laterality: N/A;  . REPLACEMENT TOTAL KNEE BILATERAL    . TOTAL VAGINAL HYSTERECTOMY     ovarian mass, not cancerous  . UPPER GI ENDOSCOPY  2015    FAMILY HISTORY Family History  Problem Relation Age of Onset  . Breast cancer Mother 42       ADVANCED DIRECTIVES:    HEALTH MAINTENANCE: Social History   Tobacco Use  . Smoking status: Never Smoker  . Smokeless tobacco: Never Used  Vaping Use  . Vaping Use: Never used  Substance Use Topics  . Alcohol use: No  . Drug use: No     Colonoscopy:  PAP:  Bone density:  Lipid panel:  Allergies  Allergen Reactions  . Atenolol Other (See Comments)    Other reaction(s): Other (See Comments) Decreased heart rate Decreased heart rate  . Codeine Shortness Of Breath    Rash, difficulty breathing, nausea.  . Losartan Other (See Comments)    Hyperkalemia  . Morphine And Related Shortness Of Breath    Rash, difficulty breathing, nausea.   . Nsaids Other (See Comments)    Other reaction(s): Unknown  . Rofecoxib     Other reaction(s): Unknown  . Sucralfate Other (See Comments)    Throat tightness  . Sulfa Antibiotics     Other reaction(s): Unknown    No current facility-administered medications for this visit.   No current outpatient medications on file.   Facility-Administered Medications Ordered in Other Visits  Medication Dose Route Frequency Provider Last Rate Last Admin  . 0.9 %  sodium chloride infusion   Intravenous Continuous Lloyd Huger, MD      . acetaminophen (TYLENOL) tablet 650 mg  650 mg Oral Q6H PRN Howerter, Justin B, DO       Or  . acetaminophen (TYLENOL) suppository 650 mg  650 mg Rectal Q6H PRN Howerter, Justin B, DO      . albuterol (VENTOLIN HFA) 108 (90 Base) MCG/ACT inhaler 2 puff  2 puff Inhalation Q6H PRN Howerter, Justin B, DO      . amitriptyline (ELAVIL) tablet 10 mg  10 mg Oral QHS Howerter, Justin B, DO   10 mg at 02/03/20 2326  .  budesonide (PULMICORT) nebulizer solution 0.5 mg  0.5 mg Inhalation Daily Howerter, Justin B, DO   0.5 mg at 02/04/20 0759  . clopidogrel (PLAVIX) tablet 75 mg  75 mg Oral Daily Howerter, Justin B, DO   75 mg at 02/03/20 0946  . doxazosin (CARDURA) tablet 8 mg  8 mg Oral Daily Howerter, Justin B, DO   8 mg at 02/03/20 0947  . epoetin alfa-epbx (RETACRIT) injection 40,000 Units  40,000 Units Subcutaneous Once Lloyd Huger, MD      . furosemide (LASIX) injection 60 mg  60 mg Intravenous BID Jennye Boroughs, MD   60 mg at 02/03/20 1802  . hydrochlorothiazide (HYDRODIURIL) tablet 50 mg  50 mg Oral Daily Howerter, Justin B, DO   50 mg at 02/03/20 1022  . levothyroxine (SYNTHROID) tablet 100 mcg  100 mcg Oral Q0600 Jennye Boroughs, MD   100 mcg at 02/04/20 0923  . mometasone-formoterol (DULERA) 200-5 MCG/ACT inhaler 2 puff  2 puff Inhalation BID Howerter, Justin B, DO  2 puff at 02/03/20 0947  . montelukast (SINGULAIR) tablet 10 mg  10 mg Oral QHS Howerter, Justin B, DO   10 mg at 02/03/20 2325  . pantoprazole (PROTONIX) EC tablet 20 mg  20 mg Oral Daily Howerter, Justin B, DO   20 mg at 02/03/20 0947  . pregabalin (LYRICA) capsule 100 mg  100 mg Oral QHS Howerter, Justin B, DO   100 mg at 02/03/20 2207  . pregabalin (LYRICA) capsule 50 mg  50 mg Oral Daily Hart Robinsons A, RPH   50 mg at 02/03/20 0946  . simvastatin (ZOCOR) tablet 40 mg  40 mg Oral QHS Howerter, Justin B, DO   40 mg at 02/03/20 2329    OBJECTIVE: There were no vitals filed for this visit.   There is no height or weight on file to calculate BMI.    ECOG FS:2 - Symptomatic, <50% confined to bed   General: Well-developed, well-nourished, no acute distress. Eyes: Pink conjunctiva, anicteric sclera. HEENT: Normocephalic, moist mucous membranes. Lungs: No audible wheezing or coughing. Heart: Regular rate and rhythm. Abdomen: Soft, nontender, no obvious distention. Musculoskeletal: No edema, cyanosis, or clubbing. Neuro: Alert,  answering all questions appropriately. Cranial nerves grossly intact. Skin: No rashes or petechiae noted. Psych: Normal affect.  LAB RESULTS:  Lab Results  Component Value Date   NA 144 02/03/2020   K 5.0 02/03/2020   CL 108 02/03/2020   CO2 31 02/03/2020   GLUCOSE 93 02/03/2020   BUN 64 (H) 02/03/2020   CREATININE 1.94 (H) 02/03/2020   CALCIUM 8.1 (L) 02/03/2020   PROT 5.1 (L) 02/03/2020   ALBUMIN 2.7 (L) 02/03/2020   AST 15 02/03/2020   ALT 12 02/03/2020   ALKPHOS 66 02/03/2020   BILITOT 0.6 02/03/2020   GFRNONAA 24 (L) 02/03/2020   GFRAA 27 (L) 08/23/2019    Lab Results  Component Value Date   WBC 3.8 (L) 02/03/2020   NEUTROABS 4.8 01/17/2020   HGB 7.6 (L) 02/03/2020   HCT 25.4 (L) 02/03/2020   MCV 101.6 (H) 02/03/2020   PLT 130 (L) 02/03/2020   Lab Results  Component Value Date   IRON 44 01/11/2020   TIBC 193 (L) 01/11/2020   IRONPCTSAT 23 01/11/2020    Lab Results  Component Value Date   FERRITIN 125 01/11/2020    STUDIES: DG Chest 2 View  Result Date: 02/02/2020 CLINICAL DATA:  Short of breath, heart failure EXAM: CHEST - 2 VIEW COMPARISON:  08/18/2019 FINDINGS: Cardiac enlargement. Bibasilar atelectasis and small pleural effusions. Upper lobes clear. Atherosclerotic aortic arch. IMPRESSION: Bibasilar atelectasis and small pleural effusions bilaterally. Electronically Signed   By: Franchot Gallo M.D.   On: 02/02/2020 12:04   MM 3D SCREEN BREAST BILATERAL  Result Date: 01/25/2020 CLINICAL DATA:  Screening. EXAM: DIGITAL SCREENING BILATERAL MAMMOGRAM WITH TOMO AND CAD COMPARISON:  Previous exam(s). ACR Breast Density Category b: There are scattered areas of fibroglandular density. FINDINGS: There are no findings suspicious for malignancy. Images were processed with CAD. IMPRESSION: No mammographic evidence of malignancy. A result letter of this screening mammogram will be mailed directly to the patient. RECOMMENDATION: Screening mammogram in one year.  (Code:SM-B-01Y) BI-RADS CATEGORY  1: Negative. Electronically Signed   By: Lillia Mountain M.D.   On: 01/25/2020 16:35    ASSESSMENT: Anemia secondary to chronic renal insufficiency.  PLAN:    1.  Anemia secondary to chronic renal insufficiency: Patient's iron stores continue to be within normal limits. Her hemoglobin has significantly trended down  is now 7.6 and she is symptomatic. Proceed with 40,000 units Retacrit today. She last received treatment with Feraheme on June 22, 2019. Return to clinic in 1 month for repeat laboratory work, further evaluation, and consideration of Retacrit or blood transfusion depending on her hemoglobin level. 2. Pulmonary nodules: Repeat CT scan from January 13, 2017 revealed stable pulmonary nodules.  No further imaging is necessary.   3. Renal insufficiency: Chronic and unchanged. Continue follow-up with nephrology as scheduled. 4.  Hypertension: Blood pressure mildly elevated today. Continue follow-up with primary care. 5.  History of GI bleed: Colonoscopy and EGD in August 2018.  Continue follow-up with GI as indicated. 6.  Pain: Chronic and unchanged. 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  Continue treatment per primary care.  Patient expressed understanding and was in agreement with this plan. She also understands that She can call clinic at any time with any questions, concerns, or complaints.    Lloyd Huger, MD   02/04/2020 8:46 AM

## 2020-02-04 NOTE — Progress Notes (Addendum)
Progress Note    Amy [name]  BLT:[id number] DOB: [birth year]  DOA: 02/02/2020 PCP: [name], MD      Brief Narrative:    Medical records reviewed and are as summarized below:  Amy [name] is a 86 y.o. female       Assessment/Plan:   Principal Problem:   Acute on chronic diastolic heart failure ([place]) Active Problems:   Acquired hypothyroidism   Hyperkalemia   Moderate persistent asthma   SOB (shortness of breath)   Body mass index is 37.44 kg/m.  (Morbid obesity)  Acute on chronic diastolic CHF with anasarca: Continue IV Lasix.  Monitor BMP, daily weight and urine output.  2D echo done in June 2021 showed EF estimated at 60 to 65%, moderate to severe LVH.  Acute hypoxic respiratory failure: Continue 2 L/min oxygen via nasal cannula.  Taper off oxygen as able.  CKD stage IV: Creatinine is stable.  Monitor BMP.  Hyperkalemia: Improved.  Hypothyroidism: TSH is 19.762.  Synthroid was increased from 88 to 100 mcg daily on 02/03/2020.  Hypertension: Continue antihypertensives  Moderate persistent asthma: Continue bronchodilators.   Diet Order            Diet Heart Room service appropriate? Yes; Fluid consistency: Thin  Diet effective now                    Consultants:  None  Procedures:  None    Medications:   . amitriptyline  10 mg Oral QHS  . budesonide  0.5 mg Inhalation Daily  . clopidogrel  75 mg Oral Daily  . doxazosin  8 mg Oral Daily  . furosemide  60 mg Intravenous BID  . hydrochlorothiazide  50 mg Oral Daily  . levothyroxine  100 mcg Oral Q0600  . mometasone-formoterol  2 puff Inhalation BID  . montelukast  10 mg Oral QHS  . pantoprazole  20 mg Oral Daily  . pregabalin  100 mg Oral QHS  . pregabalin  50 mg Oral Daily  . simvastatin  40 mg Oral QHS   Continuous Infusions:   Anti-infectives (From admission, onward)   None             Family Communication/Anticipated D/C date and  plan/Code Status   DVT prophylaxis: SCDs Start: 02/02/20 2243     Code Status: DNR  Family Communication: Plan discussed with her daughter, [name], over the phone. Disposition Plan:    Status is: Inpatient  Remains inpatient appropriate because:IV treatments appropriate due to intensity of illness or inability to take PO   Dispo: The patient is from: Home              Anticipated d/c is to: Home              Anticipated d/c date is: 2 days              Patient currently is not medically stable to d/c.           Subjective:   C/o shortness of breath and leg swelling.  She has difficulty laying down flat.  Objective:    Vitals:   02/04/20 0322 02/04/20 0800 02/04/20 0805 02/04/20 1139  BP: (!) 144/55 136/73  (!) 143/48  Pulse: (!) 56 64  (!) 52  Resp: 20 18  20   Temp: 98.2 F (36.8 C) 98.5 F (36.9 C)  98.4 F (36.9 C)  TempSrc: Oral     SpO2: 93% Marland Kitchen)  85% 94% (!) 88%  Weight: 92.9 kg     Height:       No data found.   Intake/Output Summary (Last 24 hours) at 02/04/2020 1544 Last data filed at 02/04/2020 1100 Gross per 24 hour  Intake --  Output 950 ml  Net -950 ml   Filed Weights   02/03/20 1540 02/04/20 0208 02/04/20 0322  Weight: 94.9 kg 92.9 kg 92.9 kg    Exam:  GEN: NAD SKIN: Warm and dry EYES: No pallor or icterus ENT: MMM CV: RRR PULM: CTA B ABD: soft, distended, NT, +BS CNS: AAO x person, place and situation, non focal EXT: Significant edema from bilateral thighs to bilateral feet.  No tenderness   Data Reviewed:   I have personally reviewed following labs and imaging studies:  Labs: Labs show the following:   Basic Metabolic Panel: Recent Labs  Lab 02/02/20 1221 02/03/20 0315  NA 143 144  K 5.6* 5.0  CL 107 108  CO2 28 31  GLUCOSE 89 93  BUN 65* 64*  CREATININE 2.03* 1.94*  CALCIUM 8.3* 8.1*  MG 2.4 2.3  PHOS  --  5.1*   GFR Estimated Creatinine Clearance: 20.4 mL/min (A) (by C-G formula based on SCr of 1.94  mg/dL (H)). Liver Function Tests: Recent Labs  Lab 02/03/20 0315  AST 15  ALT 12  ALKPHOS 66  BILITOT 0.6  PROT 5.1*  ALBUMIN 2.7*   No results for input(s): LIPASE, AMYLASE in the last 168 hours. No results for input(s): AMMONIA in the last 168 hours. Coagulation profile Recent Labs  Lab 02/02/20 1124  INR 1.0    CBC: Recent Labs  Lab 02/02/20 1124 02/02/20 1221 02/03/20 0315  WBC TEST WILL BE CREDITED 5.3 3.8*  HGB TEST WILL BE CREDITED 8.7* 7.6*  HCT TEST WILL BE CREDITED 27.8* 25.4*  MCV TEST WILL BE CREDITED 98.9 101.6*  PLT TEST WILL BE CREDITED 145* 130*   Cardiac Enzymes: No results for input(s): CKTOTAL, CKMB, CKMBINDEX, TROPONINI in the last 168 hours. BNP (last 3 results) No results for input(s): PROBNP in the last 8760 hours. CBG: No results for input(s): GLUCAP in the last 168 hours. D-Dimer: No results for input(s): DDIMER in the last 72 hours. Hgb A1c: No results for input(s): HGBA1C in the last 72 hours. Lipid Profile: No results for input(s): CHOL, HDL, LDLCALC, TRIG, CHOLHDL, LDLDIRECT in the last 72 hours. Thyroid function studies: Recent Labs    02/03/20 0315  TSH 19.762*   Anemia work up: Recent Labs    02/04/20 0855  FERRITIN 97  TIBC 228*  IRON 21*   Sepsis Labs: Recent Labs  Lab 02/02/20 1124 02/02/20 1221 02/03/20 0315  WBC TEST WILL BE CREDITED 5.3 3.8*    Microbiology Recent Results (from the past 240 hour(s))  Resp Panel by RT-PCR (Flu A&B, Covid) Nasopharyngeal Swab     Status: None   Collection Time: 02/02/20  4:21 PM   Specimen: Nasopharyngeal Swab; Nasopharyngeal(NP) swabs in vial transport medium  Result Value Ref Range Status   SARS Coronavirus 2 by RT PCR NEGATIVE NEGATIVE Final    Comment: (NOTE) SARS-CoV-2 target nucleic acids are NOT DETECTED.  The SARS-CoV-2 RNA is generally detectable in upper respiratory specimens during the acute phase of infection. The lowest concentration of SARS-CoV-2 viral  copies this assay can detect is 138 copies/mL. A negative result does not preclude SARS-Cov-2 infection and should not be used as the sole basis for treatment or other patient  management decisions. A negative result may occur with  improper specimen collection/handling, submission of specimen other than nasopharyngeal swab, presence of viral mutation(s) within the areas targeted by this assay, and inadequate number of viral copies(<138 copies/mL). A negative result must be combined with clinical observations, patient history, and epidemiological information. The expected result is Negative.  Fact Sheet for Patients:  EntrepreneurPulse.com.au  Fact Sheet for Healthcare Providers:  IncredibleEmployment.be  This test is no t yet approved or cleared by the [place] FDA and  has been authorized for detection and/or diagnosis of SARS-CoV-2 by FDA under an Emergency Use Authorization (EUA). This EUA will remain  in effect (meaning this test can be used) for the duration of the COVID-19 declaration under Section 564(b)(1) of the Act, 21 U.S.C.section 360bbb-3(b)(1), unless the authorization is terminated  or revoked sooner.       Influenza A by PCR NEGATIVE NEGATIVE Final   Influenza B by PCR NEGATIVE NEGATIVE Final    Comment: (NOTE) The Xpert Xpress SARS-CoV-2/FLU/RSV plus assay is intended as an aid in the diagnosis of influenza from Nasopharyngeal swab specimens and should not be used as a sole basis for treatment. Nasal washings and aspirates are unacceptable for Xpert Xpress SARS-CoV-2/FLU/RSV testing.  Fact Sheet for Patients: EntrepreneurPulse.com.au  Fact Sheet for Healthcare Providers: IncredibleEmployment.be  This test is not yet approved or cleared by the [place] FDA and has been authorized for detection and/or diagnosis of SARS-CoV-2 by FDA under an Emergency Use Authorization (EUA). This  EUA will remain in effect (meaning this test can be used) for the duration of the COVID-19 declaration under Section 564(b)(1) of the Act, 21 U.S.C. section 360bbb-3(b)(1), unless the authorization is terminated or revoked.  Performed at [facility], [address] [place], [place] [address]     Procedures and diagnostic studies:  No results found.             LOS: 2 days   Sante Biedermann  [facility]   Pager on [email or web address]. If 7PM-7AM, please contact night-coverage at [email or web address]     02/04/2020, 3:44 PM

## 2020-02-04 NOTE — Plan of Care (Signed)
Patient A/O to person, place, and situation, disoriented to time. Occassionally woke up throughout night and forgot where she was but was able to reorient herself. Patient states that she can't breath when she's on her back and she becomes easily SOB with little exertion. She is able to turn herself but will ask staff to assist her. [place] does not stay in place very well d/t patients frequent turning. Linens changed as needed and incontinent cleanser use to wash up patient. Patient remains on 3L O2, plan to wean as tolerated. Will continue the current plan of care.

## 2020-02-05 DIAGNOSIS — N184 Chronic kidney disease, stage 4 (severe): Secondary | ICD-10-CM

## 2020-02-05 DIAGNOSIS — D509 Iron deficiency anemia, unspecified: Secondary | ICD-10-CM

## 2020-02-05 DIAGNOSIS — R0602 Shortness of breath: Secondary | ICD-10-CM

## 2020-02-05 LAB — CBC WITH DIFFERENTIAL/PLATELET
Abs Immature Granulocytes: 0.01 10*3/uL (ref 0.00–0.07)
Basophils Absolute: 0 10*3/uL (ref 0.0–0.1)
Basophils Relative: 0 %
Eosinophils Absolute: 0.1 10*3/uL (ref 0.0–0.5)
Eosinophils Relative: 3 %
HCT: 26.9 % — ABNORMAL LOW (ref 36.0–46.0)
Hemoglobin: 8.1 g/dL — ABNORMAL LOW (ref 12.0–15.0)
Immature Granulocytes: 0 %
Lymphocytes Relative: 23 %
Lymphs Abs: 1.1 10*3/uL (ref 0.7–4.0)
MCH: 29.9 pg (ref 26.0–34.0)
MCHC: 30.1 g/dL (ref 30.0–36.0)
MCV: 99.3 fL (ref 80.0–100.0)
Monocytes Absolute: 0.5 10*3/uL (ref 0.1–1.0)
Monocytes Relative: 11 %
Neutro Abs: 3 10*3/uL (ref 1.7–7.7)
Neutrophils Relative %: 63 %
Platelets: 131 10*3/uL — ABNORMAL LOW (ref 150–400)
RBC: 2.71 MIL/uL — ABNORMAL LOW (ref 3.87–5.11)
RDW: 16.8 % — ABNORMAL HIGH (ref 11.5–15.5)
WBC: 4.7 10*3/uL (ref 4.0–10.5)
nRBC: 0 % (ref 0.0–0.2)

## 2020-02-05 LAB — BASIC METABOLIC PANEL
Anion gap: 10 (ref 5–15)
BUN: 68 mg/dL — ABNORMAL HIGH (ref 8–23)
CO2: 32 mmol/L (ref 22–32)
Calcium: 8.1 mg/dL — ABNORMAL LOW (ref 8.9–10.3)
Chloride: 100 mmol/L (ref 98–111)
Creatinine, Ser: 2.06 mg/dL — ABNORMAL HIGH (ref 0.44–1.00)
GFR, Estimated: 22 mL/min — ABNORMAL LOW (ref >60)
Glucose, Bld: 114 mg/dL — ABNORMAL HIGH (ref 70–99)
Potassium: 4.4 mmol/L (ref 3.5–5.1)
Sodium: 142 mmol/L (ref 135–145)

## 2020-02-05 LAB — MAGNESIUM: Magnesium: 2 mg/dL (ref 1.7–2.4)

## 2020-02-05 MED ORDER — FUROSEMIDE 10 MG/ML IJ SOLN
80.0000 mg | Freq: Two times a day (BID) | INTRAMUSCULAR | Status: DC
  Administered 2020-02-05 – 2020-02-09 (×9): 80 mg via INTRAVENOUS
  Filled 2020-02-05 (×10): qty 8

## 2020-02-05 MED ORDER — METOLAZONE 2.5 MG PO TABS
2.5000 mg | ORAL_TABLET | Freq: Every day | ORAL | Status: AC
  Administered 2020-02-05 – 2020-02-06 (×2): 2.5 mg via ORAL
  Filled 2020-02-05 (×2): qty 1

## 2020-02-05 MED ORDER — SODIUM CHLORIDE 0.9 % IV SOLN
200.0000 mg | Freq: Once | INTRAVENOUS | Status: AC
  Administered 2020-02-05: 12:00:00 200 mg via INTRAVENOUS
  Filled 2020-02-05: qty 10

## 2020-02-05 MED ORDER — HYDROCHLOROTHIAZIDE 25 MG PO TABS
50.0000 mg | ORAL_TABLET | Freq: Every day | ORAL | Status: DC
  Administered 2020-02-07 – 2020-02-08 (×2): 50 mg via ORAL
  Filled 2020-02-05 (×2): qty 2

## 2020-02-05 NOTE — Progress Notes (Addendum)
Progress Note    Amy [name]  VZC:[id number] DOB: [birth year]  DOA: 02/02/2020 PCP: [name], MD      Brief Narrative:    Medical records reviewed and are as summarized below:  Amy [name] is a 86 y.o. female       Assessment/Plan:   Principal Problem:   Acute on chronic diastolic heart failure (HCC) Active Problems:   Acquired hypothyroidism   Iron deficiency anemia   CKD (chronic kidney disease) stage 4, GFR 15-29 ml/min (HCC)   Hyperkalemia   Moderate persistent asthma   SOB (shortness of breath)   Body mass index is 37.7 kg/m.  (Morbid obesity)  Acute on chronic diastolic CHF with anasarca: Increase IV Lasix from 60 mg twice daily to 80 mg twice daily.  Give 2 doses of metolazone to enhance diuresis.  Monitor BMP, daily weight and urine output.  2D echo done in June 2021 showed EF estimated at 60 to 65%, moderate to severe LVH.  Acute hypoxic respiratory failure: Continue 2 L/min oxygen via nasal cannula.  Taper off oxygen as able.  Iron deficiency anemia: Iron 21, TIBC 228, transferrin saturation 9, ferritin 97.  Start IV iron sucrose.  CKD stage IV: Creatinine is stable.  Monitor BMP.  Hyperkalemia: Improved.  Hypothyroidism: TSH is 19.762.  Synthroid was increased from 88 to 100 mcg daily on 02/03/2020.  Hypertension: Continue antihypertensives  Moderate persistent asthma: Continue bronchodilators.   Diet Order            Diet Heart Room service appropriate? Yes; Fluid consistency: Thin  Diet effective now                    Consultants:  None  Procedures:  None    Medications:   . amitriptyline  10 mg Oral QHS  . budesonide  0.5 mg Inhalation Daily  . clopidogrel  75 mg Oral Daily  . doxazosin  8 mg Oral Daily  . furosemide  80 mg Intravenous BID  . [START ON 02/07/2020] hydrochlorothiazide  50 mg Oral Daily  . levothyroxine  100 mcg Oral Q0600  . metolazone  2.5 mg Oral Daily  .  mometasone-formoterol  2 puff Inhalation BID  . montelukast  10 mg Oral QHS  . pantoprazole  20 mg Oral Daily  . pregabalin  100 mg Oral QHS  . pregabalin  50 mg Oral Daily  . simvastatin  40 mg Oral QHS   Continuous Infusions: . iron sucrose       Anti-infectives (From admission, onward)   None             Family Communication/Anticipated D/C date and plan/Code Status   DVT prophylaxis: SCDs Start: 02/02/20 2243     Code Status: DNR  Family Communication: Plan discussed with her daughter, [name], over the phone. Disposition Plan:    Status is: Inpatient  Remains inpatient appropriate because:IV treatments appropriate due to intensity of illness or inability to take PO   Dispo: The patient is from: Home              Anticipated d/c is to: Home              Anticipated d/c date is: 2 days              Patient currently is not medically stable to d/c.           Subjective:   She still complains of shortness  of breath and leg swelling.  Objective:    Vitals:   02/04/20 1615 02/04/20 1953 02/05/20 0422 02/05/20 0816  BP: (!) 137/44 (!) 156/50 (!) 142/62 (!) 152/39  Pulse: (!) 54 (!) 54 (!) 58 (!) 52  Resp: 20 17 19 18   Temp: 98.6 F (37 C) 98.2 F (36.8 C) 97.7 F (36.5 C) (!) 97.5 F (36.4 C)  TempSrc:  Oral Oral Oral  SpO2: 92% 99% 99% 92%  Weight:   93.5 kg   Height:       No data found.   Intake/Output Summary (Last 24 hours) at 02/05/2020 1036 Last data filed at 02/05/2020 0931 Gross per 24 hour  Intake --  Output 1200 ml  Net -1200 ml   Filed Weights   02/04/20 0208 02/04/20 0322 02/05/20 0422  Weight: 92.9 kg 92.9 kg 93.5 kg    Exam:  GEN: NAD SKIN: Warm and dry EYES: EOMI ENT: MMM CV: RRR PULM: No wheezing or rales heard ABD: soft, obese, NT, +BS CNS: AAO x person, place and situation, non focal EXT: Bilateral lower extremity edema from the thighs to the feet.  No tenderness.     Data Reviewed:   I have  personally reviewed following labs and imaging studies:  Labs: Labs show the following:   Basic Metabolic Panel: Recent Labs  Lab 02/02/20 1221 02/03/20 0315 02/05/20 0853  NA 143 144 142  K 5.6* 5.0 4.4  CL 107 108 100  CO2 28 31 32  GLUCOSE 89 93 114*  BUN 65* 64* 68*  CREATININE 2.03* 1.94* 2.06*  CALCIUM 8.3* 8.1* 8.1*  MG 2.4 2.3 2.0  PHOS  --  5.1*  --    GFR Estimated Creatinine Clearance: 19.3 mL/min (A) (by C-G formula based on SCr of 2.06 mg/dL (H)). Liver Function Tests: Recent Labs  Lab 02/03/20 0315  AST 15  ALT 12  ALKPHOS 66  BILITOT 0.6  PROT 5.1*  ALBUMIN 2.7*   No results for input(s): LIPASE, AMYLASE in the last 168 hours. No results for input(s): AMMONIA in the last 168 hours. Coagulation profile Recent Labs  Lab 02/02/20 1124  INR 1.0    CBC: Recent Labs  Lab 02/02/20 1124 02/02/20 1221 02/03/20 0315 02/05/20 0853  WBC TEST WILL BE CREDITED 5.3 3.8* 4.7  NEUTROABS  --   --   --  3.0  HGB TEST WILL BE CREDITED 8.7* 7.6* 8.1*  HCT TEST WILL BE CREDITED 27.8* 25.4* 26.9*  MCV TEST WILL BE CREDITED 98.9 101.6* 99.3  PLT TEST WILL BE CREDITED 145* 130* 131*   Cardiac Enzymes: No results for input(s): CKTOTAL, CKMB, CKMBINDEX, TROPONINI in the last 168 hours. BNP (last 3 results) No results for input(s): PROBNP in the last 8760 hours. CBG: No results for input(s): GLUCAP in the last 168 hours. D-Dimer: No results for input(s): DDIMER in the last 72 hours. Hgb A1c: No results for input(s): HGBA1C in the last 72 hours. Lipid Profile: No results for input(s): CHOL, HDL, LDLCALC, TRIG, CHOLHDL, LDLDIRECT in the last 72 hours. Thyroid function studies: Recent Labs    02/03/20 0315  TSH 19.762*   Anemia work up: Recent Labs    02/04/20 0855  FERRITIN 97  TIBC 228*  IRON 21*   Sepsis Labs: Recent Labs  Lab 02/02/20 1124 02/02/20 1221 02/03/20 0315 02/05/20 0853  WBC TEST WILL BE CREDITED 5.3 3.8* 4.7     Microbiology Recent Results (from the past 240 hour(s))  Resp Panel  by RT-PCR (Flu A&B, Covid) Nasopharyngeal Swab     Status: None   Collection Time: 02/02/20  4:21 PM   Specimen: Nasopharyngeal Swab; Nasopharyngeal(NP) swabs in vial transport medium  Result Value Ref Range Status   SARS Coronavirus 2 by RT PCR NEGATIVE NEGATIVE Final    Comment: (NOTE) SARS-CoV-2 target nucleic acids are NOT DETECTED.  The SARS-CoV-2 RNA is generally detectable in upper respiratory specimens during the acute phase of infection. The lowest concentration of SARS-CoV-2 viral copies this assay can detect is 138 copies/mL. A negative result does not preclude SARS-Cov-2 infection and should not be used as the sole basis for treatment or other patient management decisions. A negative result may occur with  improper specimen collection/handling, submission of specimen other than nasopharyngeal swab, presence of viral mutation(s) within the areas targeted by this assay, and inadequate number of viral copies(<138 copies/mL). A negative result must be combined with clinical observations, patient history, and epidemiological information. The expected result is Negative.  Fact Sheet for Patients:  EntrepreneurPulse.com.au  Fact Sheet for Healthcare Providers:  IncredibleEmployment.be  This test is no t yet approved or cleared by the [place] FDA and  has been authorized for detection and/or diagnosis of SARS-CoV-2 by FDA under an Emergency Use Authorization (EUA). This EUA will remain  in effect (meaning this test can be used) for the duration of the COVID-19 declaration under Section 564(b)(1) of the Act, 21 U.S.C.section 360bbb-3(b)(1), unless the authorization is terminated  or revoked sooner.       Influenza A by PCR NEGATIVE NEGATIVE Final   Influenza B by PCR NEGATIVE NEGATIVE Final    Comment: (NOTE) The Xpert Xpress SARS-CoV-2/FLU/RSV plus assay is  intended as an aid in the diagnosis of influenza from Nasopharyngeal swab specimens and should not be used as a sole basis for treatment. Nasal washings and aspirates are unacceptable for Xpert Xpress SARS-CoV-2/FLU/RSV testing.  Fact Sheet for Patients: EntrepreneurPulse.com.au  Fact Sheet for Healthcare Providers: IncredibleEmployment.be  This test is not yet approved or cleared by the [place] FDA and has been authorized for detection and/or diagnosis of SARS-CoV-2 by FDA under an Emergency Use Authorization (EUA). This EUA will remain in effect (meaning this test can be used) for the duration of the COVID-19 declaration under Section 564(b)(1) of the Act, 21 U.S.C. section 360bbb-3(b)(1), unless the authorization is terminated or revoked.  Performed at [facility], [address] [place], [place] [address]     Procedures and diagnostic studies:  No results found.             LOS: 3 days   Mohmed Farver  [facility]   Pager on [email or web address]. If 7PM-7AM, please contact night-coverage at [email or web address]     02/05/2020, 10:36 AM

## 2020-02-05 NOTE — Plan of Care (Signed)
  Problem: Health Behavior/Discharge Planning: Goal: Ability to manage health-related needs will improve Outcome: Progressing   Problem: Activity: Goal: Risk for activity intolerance will decrease Outcome: Progressing   

## 2020-02-06 ENCOUNTER — Telehealth: Payer: Self-pay | Admitting: *Deleted

## 2020-02-06 LAB — BASIC METABOLIC PANEL
Anion gap: 9 (ref 5–15)
BUN: 65 mg/dL — ABNORMAL HIGH (ref 8–23)
CO2: 34 mmol/L — ABNORMAL HIGH (ref 22–32)
Calcium: 7.9 mg/dL — ABNORMAL LOW (ref 8.9–10.3)
Chloride: 99 mmol/L (ref 98–111)
Creatinine, Ser: 2 mg/dL — ABNORMAL HIGH (ref 0.44–1.00)
GFR, Estimated: 23 mL/min — ABNORMAL LOW (ref >60)
Glucose, Bld: 91 mg/dL (ref 70–99)
Potassium: 4.2 mmol/L (ref 3.5–5.1)
Sodium: 142 mmol/L (ref 135–145)

## 2020-02-06 LAB — CBC WITH DIFFERENTIAL/PLATELET
Abs Immature Granulocytes: 0.01 10*3/uL (ref 0.00–0.07)
Basophils Absolute: 0 10*3/uL (ref 0.0–0.1)
Basophils Relative: 0 %
Eosinophils Absolute: 0.1 10*3/uL (ref 0.0–0.5)
Eosinophils Relative: 3 %
HCT: 25.3 % — ABNORMAL LOW (ref 36.0–46.0)
Hemoglobin: 7.7 g/dL — ABNORMAL LOW (ref 12.0–15.0)
Immature Granulocytes: 0 %
Lymphocytes Relative: 17 %
Lymphs Abs: 0.9 10*3/uL (ref 0.7–4.0)
MCH: 30.3 pg (ref 26.0–34.0)
MCHC: 30.4 g/dL (ref 30.0–36.0)
MCV: 99.6 fL (ref 80.0–100.0)
Monocytes Absolute: 0.6 10*3/uL (ref 0.1–1.0)
Monocytes Relative: 11 %
Neutro Abs: 3.6 10*3/uL (ref 1.7–7.7)
Neutrophils Relative %: 69 %
Platelets: 121 10*3/uL — ABNORMAL LOW (ref 150–400)
RBC: 2.54 MIL/uL — ABNORMAL LOW (ref 3.87–5.11)
RDW: 16.7 % — ABNORMAL HIGH (ref 11.5–15.5)
WBC: 5.2 10*3/uL (ref 4.0–10.5)
nRBC: 0 % (ref 0.0–0.2)

## 2020-02-06 MED ORDER — SODIUM CHLORIDE 0.9 % IV SOLN
200.0000 mg | Freq: Once | INTRAVENOUS | Status: AC
  Administered 2020-02-06: 10:00:00 200 mg via INTRAVENOUS
  Filled 2020-02-06: qty 10

## 2020-02-06 NOTE — Progress Notes (Signed)
Mobility Specialist - Progress Note   02/06/20 1700  Mobility  Activity Sat and stood x 3;Dangled on edge of bed  Range of Motion/Exercises Right leg;Left leg (ankle pumps, straight leg raises)  Level of Assistance Moderate assist, patient does 50-74%  Assistive Device Front wheel walker  Distance Ambulated (ft) 0 ft  Mobility Response Tolerated well  Mobility performed by Mobility specialist  $Mobility charge 1 Mobility    Pre-mobility: 58 HR, 99% SpO2 During mobility: 62 HR, 98% SpO2 Post-mobility: 56 HR, 92% SpO2   Pt was lying in bed upon arrival utilizing 3L [place] O2. Pt agreed to session. Pt denied pain or fatigue, but did c/o generalized weakness. Pt stated "I can't get around. I have no use for my arms and legs." Pt performed supine exercises: ankle pumps and straight leg raises SBA with no significant changes in HR/O2. Pt was able to get EOB with maxA for LE support. Pt presents with good sitting balance as NT entered for vital check. Pt performed STS using RW with unsuccessful first attempt, but progressing from max-modA on 2nd, 3rd, and 4th attempt. Pt utilized built up momentum to stand from lowest bed height on all successful attempts. O2 ranging in mid 90s. Pt had 2 incontinent episodes upon standing. Mobility assisted with clean up. Pt returned to supine position using log-rolling technique. O2 desat to 91%. Pt was left in bed with all needs in reach and alarm set. Nurse notified of performance.    Kathee Delton Mobility Specialist 02/06/20, 5:30 PM

## 2020-02-06 NOTE — Telephone Encounter (Signed)
Pts daughter [name] called to cx pts 02/08/20 appts she stated that pts was currently admitted and even if she was to be dc before then that she would not be able to come. She said that she would call back to have appts r/s at a later date

## 2020-02-06 NOTE — Progress Notes (Addendum)
Progress Note    Amy [name]  URK:[id number] DOB: 07/07/1927  DOA: 02/02/2020 PCP: [name], MD      Brief Narrative:    Medical records reviewed and are as summarized below:  Amy [name] is a 86 y.o. female       Assessment/Plan:   Principal Problem:   Acute on chronic diastolic heart failure (HCC) Active Problems:   Acquired hypothyroidism   Iron deficiency anemia   CKD (chronic kidney disease) stage 4, GFR 15-29 ml/min (HCC)   Hyperkalemia   Moderate persistent asthma   SOB (shortness of breath)   Body mass index is 36.31 kg/m.  (Morbid obesity)  Acute on chronic diastolic CHF with anasarca: Continue IV Lasix and oral metolazone.  Monitor BMP, daily weight and urine output.  2D echo done in June 2021 showed EF estimated at 60 to 65%, moderate to severe LVH.  Acute hypoxic respiratory failure: Continue 2 L/min oxygen via nasal cannula.  Taper off oxygen as able.  Iron deficiency anemia: Iron 21, TIBC 228, transferrin saturation 9, ferritin 97.  S/p IV iron sucrose 200 mg x 1 dose all 02/05/2020.  Repeat 200 mg of IV iron sucrose today.  CKD stage IV: Creatinine is stable.  Monitor BMP.  Hyperkalemia: Improved.  Hypothyroidism: TSH is 19.762.  Synthroid was increased from 88 to 100 mcg daily on 02/03/2020.  Hypertension: Continue antihypertensives  Moderate persistent asthma: Continue bronchodilators.   Diet Order            Diet Heart Room service appropriate? Yes; Fluid consistency: Thin  Diet effective now                    Consultants:  None  Procedures:  None    Medications:   . amitriptyline  10 mg Oral QHS  . budesonide  0.5 mg Inhalation Daily  . clopidogrel  75 mg Oral Daily  . doxazosin  8 mg Oral Daily  . furosemide  80 mg Intravenous BID  . [START ON 02/07/2020] hydrochlorothiazide  50 mg Oral Daily  . levothyroxine  100 mcg Oral Q0600  . mometasone-formoterol  2 puff Inhalation BID  .  montelukast  10 mg Oral QHS  . pantoprazole  20 mg Oral Daily  . pregabalin  100 mg Oral QHS  . pregabalin  50 mg Oral Daily  . simvastatin  40 mg Oral QHS   Continuous Infusions:    Anti-infectives (From admission, onward)   None             Family Communication/Anticipated D/C date and plan/Code Status   DVT prophylaxis: SCDs Start: 02/02/20 2243     Code Status: DNR  Family Communication: None Disposition Plan:    Status is: Inpatient  Remains inpatient appropriate because:IV treatments appropriate due to intensity of illness or inability to take PO   Dispo: The patient is from: Home              Anticipated d/c is to: Home              Anticipated d/c date is: 2 days              Patient currently is not medically stable to d/c.           Subjective:   C/o lower extremity swelling.  She is still short of breath particularly with exertion.  Objective:    Vitals:   02/06/20 0417 02/06/20 0758 02/06/20  1123 02/06/20 1124  BP: (!) 157/51 (!) 154/59 (!) 145/37 (!) 134/31  Pulse: (!) 55 (!) 53 67 63  Resp: 18 17 17 17   Temp: 98.1 F (36.7 C) 97.8 F (36.6 C) (!) 97.5 F (36.4 C) (!) 97.5 F (36.4 C)  TempSrc: Oral Oral Oral Oral  SpO2: 95% 99% (!) 70% 99%  Weight: 90 kg     Height:       No data found.   Intake/Output Summary (Last 24 hours) at 02/06/2020 1610 Last data filed at 02/06/2020 1027 Gross per 24 hour  Intake 480 ml  Output 300 ml  Net 180 ml   Filed Weights   02/04/20 0322 02/05/20 0422 02/06/20 0417  Weight: 92.9 kg 93.5 kg 90 kg    Exam:  GEN: NAD SKIN: Warm and dry EYES: Pale but anicteric ENT: MMM CV: RRR PULM: CTA B ABD: soft, obese/distended, NT, +BS CNS: AAO x 3, non focal EXT: Still has significant edema in b/l lower extremities. No tenderness     Data Reviewed:   I have personally reviewed following labs and imaging studies:  Labs: Labs show the following:   Basic Metabolic Panel: Recent  Labs  Lab 02/02/20 1221 02/03/20 0315 02/05/20 0853 02/06/20 0529  NA 143 144 142 142  K 5.6* 5.0 4.4 4.2  CL 107 108 100 99  CO2 28 31 32 34*  GLUCOSE 89 93 114* 91  BUN 65* 64* 68* 65*  CREATININE 2.03* 1.94* 2.06* 2.00*  CALCIUM 8.3* 8.1* 8.1* 7.9*  MG 2.4 2.3 2.0  --   PHOS  --  5.1*  --   --    GFR Estimated Creatinine Clearance: 19.5 mL/min (A) (by C-G formula based on SCr of 2 mg/dL (H)). Liver Function Tests: Recent Labs  Lab 02/03/20 0315  AST 15  ALT 12  ALKPHOS 66  BILITOT 0.6  PROT 5.1*  ALBUMIN 2.7*   No results for input(s): LIPASE, AMYLASE in the last 168 hours. No results for input(s): AMMONIA in the last 168 hours. Coagulation profile Recent Labs  Lab 02/02/20 1124  INR 1.0    CBC: Recent Labs  Lab 02/02/20 1124 02/02/20 1221 02/03/20 0315 02/05/20 0853 02/06/20 0529  WBC TEST WILL BE CREDITED 5.3 3.8* 4.7 5.2  NEUTROABS  --   --   --  3.0 3.6  HGB TEST WILL BE CREDITED 8.7* 7.6* 8.1* 7.7*  HCT TEST WILL BE CREDITED 27.8* 25.4* 26.9* 25.3*  MCV TEST WILL BE CREDITED 98.9 101.6* 99.3 99.6  PLT TEST WILL BE CREDITED 145* 130* 131* 121*   Cardiac Enzymes: No results for input(s): CKTOTAL, CKMB, CKMBINDEX, TROPONINI in the last 168 hours. BNP (last 3 results) No results for input(s): PROBNP in the last 8760 hours. CBG: No results for input(s): GLUCAP in the last 168 hours. D-Dimer: No results for input(s): DDIMER in the last 72 hours. Hgb A1c: No results for input(s): HGBA1C in the last 72 hours. Lipid Profile: No results for input(s): CHOL, HDL, LDLCALC, TRIG, CHOLHDL, LDLDIRECT in the last 72 hours. Thyroid function studies: No results for input(s): TSH, T4TOTAL, T3FREE, THYROIDAB in the last 72 hours.  Invalid input(s): FREET3 Anemia work up: Recent Labs    02/04/20 0855  FERRITIN 97  TIBC 228*  IRON 21*   Sepsis Labs: Recent Labs  Lab 02/02/20 1221 02/03/20 0315 02/05/20 0853 02/06/20 0529  WBC 5.3 3.8* 4.7 5.2     Microbiology Recent Results (from the past 240 hour(s))  Resp Panel by RT-PCR (Flu A&B, Covid) Nasopharyngeal Swab     Status: None   Collection Time: 02/02/20  4:21 PM   Specimen: Nasopharyngeal Swab; Nasopharyngeal(NP) swabs in vial transport medium  Result Value Ref Range Status   SARS Coronavirus 2 by RT PCR NEGATIVE NEGATIVE Final    Comment: (NOTE) SARS-CoV-2 target nucleic acids are NOT DETECTED.  The SARS-CoV-2 RNA is generally detectable in upper respiratory specimens during the acute phase of infection. The lowest concentration of SARS-CoV-2 viral copies this assay can detect is 138 copies/mL. A negative result does not preclude SARS-Cov-2 infection and should not be used as the sole basis for treatment or other patient management decisions. A negative result may occur with  improper specimen collection/handling, submission of specimen other than nasopharyngeal swab, presence of viral mutation(s) within the areas targeted by this assay, and inadequate number of viral copies(<138 copies/mL). A negative result must be combined with clinical observations, patient history, and epidemiological information. The expected result is Negative.  Fact Sheet for Patients:  EntrepreneurPulse.com.au  Fact Sheet for Healthcare Providers:  IncredibleEmployment.be  This test is no t yet approved or cleared by the [place] FDA and  has been authorized for detection and/or diagnosis of SARS-CoV-2 by FDA under an Emergency Use Authorization (EUA). This EUA will remain  in effect (meaning this test can be used) for the duration of the COVID-19 declaration under Section 564(b)(1) of the Act, 21 U.S.C.section 360bbb-3(b)(1), unless the authorization is terminated  or revoked sooner.       Influenza A by PCR NEGATIVE NEGATIVE Final   Influenza B by PCR NEGATIVE NEGATIVE Final    Comment: (NOTE) The Xpert Xpress SARS-CoV-2/FLU/RSV plus assay is  intended as an aid in the diagnosis of influenza from Nasopharyngeal swab specimens and should not be used as a sole basis for treatment. Nasal washings and aspirates are unacceptable for Xpert Xpress SARS-CoV-2/FLU/RSV testing.  Fact Sheet for Patients: EntrepreneurPulse.com.au  Fact Sheet for Healthcare Providers: IncredibleEmployment.be  This test is not yet approved or cleared by the [place] FDA and has been authorized for detection and/or diagnosis of SARS-CoV-2 by FDA under an Emergency Use Authorization (EUA). This EUA will remain in effect (meaning this test can be used) for the duration of the COVID-19 declaration under Section 564(b)(1) of the Act, 21 U.S.C. section 360bbb-3(b)(1), unless the authorization is terminated or revoked.  Performed at [facility], [address] [place], [place] [address]     Procedures and diagnostic studies:  No results found.             LOS: 4 days   Tamarick Kovalcik  [facility]   Pager on [email or web address]. If 7PM-7AM, please contact night-coverage at [email or web address]     02/06/2020, 4:10 PM

## 2020-02-06 NOTE — Care Management Important Message (Signed)
Important Message  Patient Details  Name: Amy [name] MRN: [id number] Date of Birth: 09/19/1927   Medicare Important Message Given:  Yes     Dannette Barbara 02/06/2020, 11:59 AM

## 2020-02-07 ENCOUNTER — Inpatient Hospital Stay (HOSPITAL_COMMUNITY)
Admit: 2020-02-07 | Discharge: 2020-02-07 | Disposition: A | Discharge status: Home / Self Care | Payer: Medicare Other | Attending: Internal Medicine | Admitting: Internal Medicine

## 2020-02-07 ENCOUNTER — Inpatient Hospital Stay: Payer: Medicare Other

## 2020-02-07 DIAGNOSIS — Z515 Encounter for palliative care: Secondary | ICD-10-CM

## 2020-02-07 DIAGNOSIS — Z7189 Other specified counseling: Secondary | ICD-10-CM

## 2020-02-07 DIAGNOSIS — I5031 Acute diastolic (congestive) heart failure: Secondary | ICD-10-CM

## 2020-02-07 LAB — CBC WITH DIFFERENTIAL/PLATELET
Abs Immature Granulocytes: 0.06 10*3/uL (ref 0.00–0.07)
Basophils Absolute: 0 10*3/uL (ref 0.0–0.1)
Basophils Relative: 0 %
Eosinophils Absolute: 0 10*3/uL (ref 0.0–0.5)
Eosinophils Relative: 0 %
HCT: 27.3 % — ABNORMAL LOW (ref 36.0–46.0)
Hemoglobin: 8.5 g/dL — ABNORMAL LOW (ref 12.0–15.0)
Immature Granulocytes: 1 %
Lymphocytes Relative: 5 %
Lymphs Abs: 0.5 10*3/uL — ABNORMAL LOW (ref 0.7–4.0)
MCH: 31 pg (ref 26.0–34.0)
MCHC: 31.1 g/dL (ref 30.0–36.0)
MCV: 99.6 fL (ref 80.0–100.0)
Monocytes Absolute: 0.9 10*3/uL (ref 0.1–1.0)
Monocytes Relative: 8 %
Neutro Abs: 9.3 10*3/uL — ABNORMAL HIGH (ref 1.7–7.7)
Neutrophils Relative %: 86 %
Platelets: 131 10*3/uL — ABNORMAL LOW (ref 150–400)
RBC: 2.74 MIL/uL — ABNORMAL LOW (ref 3.87–5.11)
RDW: 17.1 % — ABNORMAL HIGH (ref 11.5–15.5)
WBC: 10.7 10*3/uL — ABNORMAL HIGH (ref 4.0–10.5)
nRBC: 0 % (ref 0.0–0.2)

## 2020-02-07 LAB — BASIC METABOLIC PANEL
Anion gap: 10 (ref 5–15)
BUN: 62 mg/dL — ABNORMAL HIGH (ref 8–23)
CO2: 34 mmol/L — ABNORMAL HIGH (ref 22–32)
Calcium: 8.2 mg/dL — ABNORMAL LOW (ref 8.9–10.3)
Chloride: 99 mmol/L (ref 98–111)
Creatinine, Ser: 1.91 mg/dL — ABNORMAL HIGH (ref 0.44–1.00)
GFR, Estimated: 25 mL/min — ABNORMAL LOW (ref >60)
Glucose, Bld: 143 mg/dL — ABNORMAL HIGH (ref 70–99)
Potassium: 4.5 mmol/L (ref 3.5–5.1)
Sodium: 143 mmol/L (ref 135–145)

## 2020-02-07 LAB — MAGNESIUM: Magnesium: 1.9 mg/dL (ref 1.7–2.4)

## 2020-02-07 MED ORDER — MELATONIN 5 MG PO TABS
5.0000 mg | ORAL_TABLET | Freq: Every evening | ORAL | Status: DC | PRN
  Administered 2020-02-07: 01:00:00 5 mg via ORAL
  Filled 2020-02-07: qty 1

## 2020-02-07 MED ORDER — GUAIFENESIN 100 MG/5ML PO SOLN
5.0000 mL | ORAL | Status: DC | PRN
  Administered 2020-02-08 – 2020-02-14 (×9): 100 mg via ORAL
  Filled 2020-02-07 (×11): qty 5

## 2020-02-07 NOTE — Progress Notes (Addendum)
Pt had an unwitnessed fall. Chair alarm was going off I ran to the room. Transporter was walking by and said pt was on the floor. I found her sitting on the floor. Per pt she was trying to get up, no pain, no bruises noted on her back or head. No complains of any pain, VSS. Pt back in the bed. Neuro check intact. Pt cleaned and changed. Pt stated" this happens all the time at home". Pt from home alone. Dr. Mal Misty notified by charge RN, no new orders received.Son Adalyn Pennock notified of unwitnessed fall. Low bed ordered, will monitor pt closely.  02/07/20 1500  What Happened  Was fall witnessed? No  Was patient injured? No  Patient found on floor  Found by Staff-comment (Kadar Chance)  Stated prior activity bathroom-unassisted  Follow Up  MD notified ayiku  Time MD notified 22  Family notified Yes - comment (son-Eddie Wardle)  Time family notified 1612  Additional tests No  Progress note created (see row info) Yes  Adult Fall Risk Assessment  Risk Factor Category (scoring not indicated) High fall risk per protocol (document High fall risk)  Patient Fall Risk Level High fall risk  Adult Fall Risk Interventions  Required Bundle Interventions *See Row Information* High fall risk - low, moderate, and high requirements implemented  Additional Interventions Pharmacy review of medications;PT/OT need assessed if change in mobility from baseline;Use of appropriate toileting equipment (bedpan, BSC, etc.)  Screening for Fall Injury Risk (To be completed on HIGH fall risk patients) - Assessing Need for Low Bed  Risk For Fall Injury- Low Bed Criteria Previous fall this admission  Will Implement Low Bed and Floor Mats Yes  Vitals  Temp 98.4 F (36.9 C)  Temp Source Oral  BP (!) 182/97  MAP (mmHg) 82  BP Location Right Arm  BP Method Automatic  Patient Position (if appropriate) Lying  Pulse Rate 66  Pulse Rate Source Monitor  Oxygen Therapy  SpO2 99 %  O2 Device Nasal Cannula  O2 Flow Rate  (L/min) 5 L/min  Pain Assessment  Pain Scale 0-10  Pain Score 0

## 2020-02-07 NOTE — Plan of Care (Signed)
  Problem: Health Behavior/Discharge Planning: Goal: Ability to manage health-related needs will improve Outcome: Not Progressing   Problem: Activity: Goal: Risk for activity intolerance will decrease Outcome: Not Progressing   

## 2020-02-07 NOTE — Consult Note (Signed)
Consultation Note Date: 02/07/2020   Patient Name: Amy [name]  DOB: 15-Jun-1927  MRN: [id number]  Age / Sex: 86 y.o., female  PCP: [name], MD Referring Physician: Jennye Boroughs, MD  Reason for Consultation: Establishing goals of care  HPI/Patient Profile: ASNA MULDROW is a 86 y.o. female with medical history significant for chronic diastolic heart failure with most recent echocardiogram in June 2021 showing LVEF 60 to 65% with grade 1 diastolic dysfunction, acquired hypothyroidism, stage IIIb chronic kidney disease with baseline creatinine 6.9-6.7 complicated by anemia of chronic kidney disease with baseline hemoglobin of 7.5-10, moderate persistent asthma, GERD, who is admitted to [facility] on 02/02/2020 with acute hypoxic respiratory distress.  Clinical Assessment and Goals of Care: Patient is resting in bed. She is confused thinking she is in her daughter Debbie's house. She states she ha been widowed over 75 years. She states she has 2/3 children living. She states Debbie died. 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 She asks me if I have seen Debbie yet.   Stepped out and called daughter [name]. [name] states she has lived in a guest house behind her (Sue's) niece's house for over 20 years. She states she hates going to the doctor and will put off going. She states she was supposed to call the doctor if she gained more than 2 pounds and gained around 30 pounds, but the family could not tell it.   She states for around a week prior to this hospitalization she has been more confused, but prior to that time, she was fully coherent. [name] discusses her shingles and that she sees a pain doctor for this. She states her arm pain has improved and they are decreasing the medication.   [name] discusses some concerns with her stay. Offered to have someone speak with her or that she could  speak to someone when she comes. She requested to speak with TOC. Message sent to [facility], and number provided to [place]. Will follow up tomorrow.     SUMMARY OF RECOMMENDATIONS   Will follow up tomorrow.   Prognosis:   Unable to determine  Discharge Planning: To Be Determined      Primary Diagnoses: Present on Admission: . Acute on chronic diastolic heart failure ([place]) . Acquired hypothyroidism . Hyperkalemia . Moderate persistent asthma . SOB (shortness of breath) . CKD (chronic kidney disease) stage 4, GFR 15-29 ml/min (HCC) . Iron deficiency anemia   I have reviewed the medical record, interviewed the patient and family, and examined the patient. The following aspects are pertinent.  Past Medical History:  Diagnosis Date  . Bladder incontinence   . CAD (coronary artery disease)    a. 05/2009 Cath: LAD 90p (Xience 2.75 x 12 mm DES), 44m, D1 40, LCx 40p/m, RCA 30/40/30p/m, RCA 30/25d;  b.  12/2011 Lexiscan MV: no ischemia, breast attenuation artifact, normal EF-->Low risk; c. 10/2016 MV: fixed apical defect, most likely apical thinning and attenuation, No ischemia, EF 60%.  . Cancer ([place])  ovarian  . Carotid arterial disease w/ R Carotid Bruit (HCC)    a. 08/2016 Carotid U/S: 40-59% bilat ICA stenosis - f/u 1 yr.  . Chronic diastolic (congestive) heart failure ([place])    a. Echo 09/2015: EF 55-60% w/ Grade 1 DD, sev Ca2+ MV annulus, mildly dil LA; b. 09/2016 Echo: EF 65-70%, Gr2 DD, mildly dil LA/RA, nl RV fxn.  . Chronic Dyspnea on exertion   . CKD (chronic kidney disease) stage 3, GFR 30-59 ml/min (HCC)   . Degenerative arthritis of knee    bilateral knees  . Diabetes mellitus    Type II  . GIB (gastrointestinal bleeding)    a. 08/2016 Admission w/ presyncope/anemia/melena-->req Transfusion-->endo ok, colonoscopy w/ polyps but no source of bleeding (most likely diverticular).  . Hiatal hernia   . Hypertension   . Iron deficiency   . Menopausal symptoms   . Morbid obesity  ([place])   . Renal insufficiency   . Thyroid disease    hypothyroidism   Social History   Socioeconomic History  . Marital status: Widowed    Spouse name: Not on file  . Number of children: Not on file  . Years of education: Not on file  . Highest education level: Not on file  Occupational History  . Not on file  Tobacco Use  . Smoking status: Never Smoker  . Smokeless tobacco: Never Used  Vaping Use  . Vaping Use: Never used  Substance and Sexual Activity  . Alcohol use: No  . Drug use: No  . Sexual activity: Not on file  Other Topics Concern  . Not on file  Social History Narrative  . Not on file   Social Determinants of Health   Financial Resource Strain: Not on file  Food Insecurity: Not on file  Transportation Needs: Not on file  Physical Activity: Not on file  Stress: Not on file  Social Connections: Not on file   Family History  Problem Relation Age of Onset  . Breast cancer Mother 12   Scheduled Meds: . amitriptyline  10 mg Oral QHS  . budesonide  0.5 mg Inhalation Daily  . clopidogrel  75 mg Oral Daily  . doxazosin  8 mg Oral Daily  . furosemide  80 mg Intravenous BID  . hydrochlorothiazide  50 mg Oral Daily  . levothyroxine  100 mcg Oral Q0600  . mometasone-formoterol  2 puff Inhalation BID  . montelukast  10 mg Oral QHS  . pantoprazole  20 mg Oral Daily  . pregabalin  100 mg Oral QHS  . simvastatin  40 mg Oral QHS   Continuous Infusions: PRN Meds:.acetaminophen **OR** acetaminophen, albuterol, guaiFENesin, melatonin Medications Prior to Admission:  Prior to Admission medications   Medication Sig Start Date End Date Taking? Authorizing Provider  amitriptyline (ELAVIL) 10 MG tablet Take 10 mg by mouth at bedtime. 12/04/19  Yes [provider]  budesonide (PULMICORT) 0.5 MG/2ML nebulizer solution Inhale 0.5 mg into the lungs daily. 03/16/19 04/04/20 Yes [provider]  cloNIDine (CATAPRES - DOSED IN MG/24 HR) 0.2 mg/24hr patch Place  1 patch (0.2 mg total) onto the skin once a week. 06/29/17  Yes Darylene Price A, FNP  clopidogrel (PLAVIX) 75 MG tablet Take 1 tablet (75 mg total) by mouth daily. 09/27/16  Yes Dustin Flock, MD  doxazosin (CARDURA) 8 MG tablet Take 8 mg by mouth daily. 12/20/19  Yes [provider]  hydrochlorothiazide (HYDRODIURIL) 50 MG tablet Take 50 mg by mouth daily. 01/10/20 01/09/21  Yes [provider]  levothyroxine (SYNTHROID) 88 MCG tablet 88 mcg daily before breakfast.  06/20/19  Yes [provider]  lisinopril (ZESTRIL) 2.5 MG tablet Take 2.5 mg by mouth daily. 01/10/20 01/09/21 Yes [provider]  montelukast (SINGULAIR) 10 MG tablet Take 10 mg by mouth at bedtime.  03/24/19  Yes [provider]  pantoprazole (PROTONIX) 40 MG tablet Take 20 mg by mouth daily.    Yes [provider]  pregabalin (LYRICA) 50 MG capsule 50 mg during the day, 100 mg qhs Patient taking differently: Take 50-100 mg by mouth 2 (two) times daily. 50 mg during the day, 100 mg qhs 12/17/19  Yes Lateef, Carlus Pavlov, MD  simvastatin (ZOCOR) 40 MG tablet Take 1 tablet (40 mg total) by mouth at bedtime. 03/22/18  Yes Minna Merritts, MD  SYMBICORT 160-4.5 MCG/ACT inhaler Inhale 2 puffs into the lungs 2 (two) times daily.  11/15/14  Yes [provider]  torsemide (DEMADEX) 20 MG tablet Take 20 mg by mouth daily. 09/21/19 02/02/20 Yes [provider]  albuterol (PROVENTIL HFA;VENTOLIN HFA) 108 (90 Base) MCG/ACT inhaler Inhale 2 puffs into the lungs every 6 (six) hours as needed for wheezing or shortness of breath.    [provider]  hydrALAZINE (APRESOLINE) 100 MG tablet Take 100 mg by mouth 3 (three) times daily.  Patient not taking: Reported on 01/11/2020 05/21/19   [provider]  nitroGLYCERIN (NITROSTAT) 0.4 MG SL tablet Place 1 tablet (0.4 mg total) under the tongue every 5 (five) minutes x 3 doses as needed for chest pain. 08/23/19   Enzo Bi, MD    Allergies  Allergen Reactions  . Atenolol Other (See Comments)    Other reaction(s): Other (See Comments) Decreased heart rate Decreased heart rate  . Codeine Shortness Of Breath    Rash, difficulty breathing, nausea.  . Losartan Other (See Comments)    Hyperkalemia  . Morphine And Related Shortness Of Breath    Rash, difficulty breathing, nausea.   . Nsaids Other (See Comments)    Other reaction(s): Unknown  . Rofecoxib     Other reaction(s): Unknown  . Sucralfate Other (See Comments)    Throat tightness  . Sulfa Antibiotics     Other reaction(s): Unknown   Review of Systems  All other systems reviewed and are negative.   Physical Exam Pulmonary:     Effort: Pulmonary effort is normal.  Skin:    General: Skin is warm and dry.  Neurological:     Mental Status: She is alert.     Comments: Confused.      Vital Signs: BP (!) 178/49   Pulse 66   Temp 98.4 F (36.9 C) (Oral)   Resp 18   Ht 5\' 2"  (1.575 m)   Wt 91.9 kg   SpO2 99%   BMI 37.07 kg/m  Pain Scale: 0-10   Pain Score: 0-No pain   SpO2: SpO2: 99 % O2 Device:SpO2: 99 % O2 Flow Rate: .O2 Flow Rate (L/min): 3 L/min  IO: Intake/output summary:   Intake/Output Summary (Last 24 hours) at 02/07/2020 1556 Last data filed at 02/07/2020 1030 Gross per 24 hour  Intake 280 ml  Output 500 ml  Net -220 ml    LBM: Last BM Date: 02/06/20 Baseline Weight: Weight: 81.6 kg Most recent weight: Weight: 91.9 kg     Palliative Assessment/Data:     Time In: 3:40 Time Out:4:10 Time Total: 30 min Greater than 50%  of this time  was spent counseling and coordinating care related to the above assessment and plan.  Signed by: Asencion Gowda, NP   Please contact Palliative Medicine Team phone at [phone] for questions and concerns.  For individual provider: See Shea Evans

## 2020-02-07 NOTE — Progress Notes (Signed)
Progress Note    Amy [name]  TFT:[id number] DOB: 1927/05/03  DOA: 02/02/2020 PCP: [name], MD      Brief Narrative:    Medical records reviewed and are as summarized below:  Amy [name] is a 86 y.o. female with medical history significant for chronic diastolic CHF, CKD stage IV, hypothyroidism, right arm shingles in May 7062 complicated by postherpetic neuralgia on Lyrica, iron deficiency anemia with recent blood transfusion at the cancer center in November 2021, moderate persistent asthma, who was brought to the hospital because of increasing shortness of breath and lower extremity edema.  She was hypoxic with oxygen saturation of 88% on room air.  She was admitted to the hospital for acute exacerbation of chronic diastolic CHF.  She was treated with IV Lasix and oral metolazone.  She also required up to 5 L/min oxygen via nasal cannula.  She was given 2 doses of IV iron sucrose for iron deficiency anemia.  She has hypothyroidism and she is on Synthroid 88 mcg daily but TSH was 19.762.  Her Synthroid was increased to 100 mcg daily.    Assessment/Plan:   Principal Problem:   Acute on chronic diastolic heart failure (HCC) Active Problems:   Acquired hypothyroidism   Iron deficiency anemia   CKD (chronic kidney disease) stage 4, GFR 15-29 ml/min (HCC)   Hyperkalemia   Moderate persistent asthma   SOB (shortness of breath)   Body mass index is 37.07 kg/m.  (Morbid obesity)  Acute on chronic diastolic CHF with anasarca: Continue IV Lasix.  Monitor BMP, daily weight and urine output.  Repeat 2D echo today. 2D echo done in June 2021 showed EF estimated at 60 to 65%, moderate to severe LVH.  Acute hypoxic respiratory failure: Worsening.  Repeat chest x-ray today.  She is requiring 5 L/min (up from 2 L/min) oxygen via nasal cannula.  Taper down oxygen as able.  She may need home oxygen.   Iron deficiency anemia: Iron 21, TIBC 228, transferrin saturation 9,  ferritin 97.  S/p IV iron sucrose 200 mg on 02/05/2020 and 02/06/2020.    CKD stage IV: Creatinine is stable.  Monitor BMP.  Hyperkalemia: Improved.  Hypothyroidism: TSH is 19.762.  Synthroid was increased from 88 to 100 mcg daily on 02/03/2020.  Hypertension: Continue antihypertensives  Moderate persistent asthma: Continue bronchodilators.  History of persistent right arm in May 3762 complicated by postherpetic neuralgia: Her daughter requested that Lyrica be decreased from total of 150 mg a day to 100 mg a day because pain in right arm is getting better.  Consult palliative care team establish goals of care  Plan of care was discussed with her daughter, Ms. Merton Border, over the phone.   Diet Order            Diet Heart Room service appropriate? Yes; Fluid consistency: Thin  Diet effective now                    Consultants:  None  Procedures:  None    Medications:    amitriptyline  10 mg Oral QHS   budesonide  0.5 mg Inhalation Daily   clopidogrel  75 mg Oral Daily   doxazosin  8 mg Oral Daily   furosemide  80 mg Intravenous BID   hydrochlorothiazide  50 mg Oral Daily   levothyroxine  100 mcg Oral Q0600   mometasone-formoterol  2 puff Inhalation BID   montelukast  10 mg Oral QHS  pantoprazole  20 mg Oral Daily   pregabalin  100 mg Oral QHS   pregabalin  50 mg Oral Daily   simvastatin  40 mg Oral QHS   Continuous Infusions:    Anti-infectives (From admission, onward)   None             Family Communication/Anticipated D/C date and plan/Code Status   DVT prophylaxis: SCDs Start: 02/02/20 2243     Code Status: DNR  Family Communication: Plan discussed with her daughter, Ms. [name] [place] Disposition Plan:    Status is: Inpatient  Remains inpatient appropriate because:IV treatments appropriate due to intensity of illness or inability to take PO   Dispo: The patient is from: Home              Anticipated d/c is  to: Home              Anticipated d/c date is: 2 days              Patient currently is not medically stable to d/c.           Subjective:   Interval events noted. C/o cough, wheezing and shortness of breath.  She was requiring up to 5 L/min oxygen overnight.  She still has some swelling in the legs even though it is better.  Objective:    Vitals:   02/07/20 0430 02/07/20 0500 02/07/20 0742 02/07/20 0804  BP:   (!) 123/37   Pulse:   64   Resp:   20   Temp:   97.9 F (36.6 C)   TempSrc:   Oral   SpO2: (!) 88% 90% 92% 92%  Weight:      Height:       No data found.   Intake/Output Summary (Last 24 hours) at 02/07/2020 1044 Last data filed at 02/07/2020 1030 Gross per 24 hour  Intake 280 ml  Output 500 ml  Net -220 ml   Filed Weights   02/05/20 0422 02/06/20 0417 02/07/20 0419  Weight: 93.5 kg 90 kg 91.9 kg    Exam:   GEN: No acute respiratory distress SKIN: Warm and dry EYES: No pallor or icterus ENT: MMM CV: RRR PULM: Bilateral expiratory wheezing.  No rales heard. ABD: soft, obese/distended, NT, +BS CNS: AAO x 3, non focal EXT: Bilateral lower extremity edema is improving but right leg is more swollen than the left leg.  No tenderness.     Data Reviewed:   I have personally reviewed following labs and imaging studies:  Labs: Labs show the following:   Basic Metabolic Panel: Recent Labs  Lab 02/02/20 1221 02/03/20 0315 02/05/20 0853 02/06/20 0529 02/07/20 0452  NA 143 144 142 142 143  K 5.6* 5.0 4.4 4.2 4.5  CL 107 108 100 99 99  CO2 28 31 32 34* 34*  GLUCOSE 89 93 114* 91 143*  BUN 65* 64* 68* 65* 62*  CREATININE 2.03* 1.94* 2.06* 2.00* 1.91*  CALCIUM 8.3* 8.1* 8.1* 7.9* 8.2*  MG 2.4 2.3 2.0  --  1.9  PHOS  --  5.1*  --   --   --    GFR Estimated Creatinine Clearance: 20.6 mL/min (A) (by C-G formula based on SCr of 1.91 mg/dL (H)). Liver Function Tests: Recent Labs  Lab 02/03/20 0315  AST 15  ALT 12  ALKPHOS 66  BILITOT  0.6  PROT 5.1*  ALBUMIN 2.7*   No results for input(s): LIPASE, AMYLASE in the last 168 hours. No  results for input(s): AMMONIA in the last 168 hours. Coagulation profile Recent Labs  Lab 02/02/20 1124  INR 1.0    CBC: Recent Labs  Lab 02/02/20 1221 02/03/20 0315 02/05/20 0853 02/06/20 0529 02/07/20 0452  WBC 5.3 3.8* 4.7 5.2 10.7*  NEUTROABS  --   --  3.0 3.6 9.3*  HGB 8.7* 7.6* 8.1* 7.7* 8.5*  HCT 27.8* 25.4* 26.9* 25.3* 27.3*  MCV 98.9 101.6* 99.3 99.6 99.6  PLT 145* 130* 131* 121* 131*   Cardiac Enzymes: No results for input(s): CKTOTAL, CKMB, CKMBINDEX, TROPONINI in the last 168 hours. BNP (last 3 results) No results for input(s): PROBNP in the last 8760 hours. CBG: No results for input(s): GLUCAP in the last 168 hours. D-Dimer: No results for input(s): DDIMER in the last 72 hours. Hgb A1c: No results for input(s): HGBA1C in the last 72 hours. Lipid Profile: No results for input(s): CHOL, HDL, LDLCALC, TRIG, CHOLHDL, LDLDIRECT in the last 72 hours. Thyroid function studies: No results for input(s): TSH, T4TOTAL, T3FREE, THYROIDAB in the last 72 hours.  Invalid input(s): FREET3 Anemia work up: No results for input(s): VITAMINB12, FOLATE, FERRITIN, TIBC, IRON, RETICCTPCT in the last 72 hours. Sepsis Labs: Recent Labs  Lab 02/03/20 0315 02/05/20 0853 02/06/20 0529 02/07/20 0452  WBC 3.8* 4.7 5.2 10.7*    Microbiology Recent Results (from the past 240 hour(s))  Resp Panel by RT-PCR (Flu A&B, Covid) Nasopharyngeal Swab     Status: None   Collection Time: 02/02/20  4:21 PM   Specimen: Nasopharyngeal Swab; Nasopharyngeal(NP) swabs in vial transport medium  Result Value Ref Range Status   SARS Coronavirus 2 by RT PCR NEGATIVE NEGATIVE Final    Comment: (NOTE) SARS-CoV-2 target nucleic acids are NOT DETECTED.  The SARS-CoV-2 RNA is generally detectable in upper respiratory specimens during the acute phase of infection. The lowest concentration of  SARS-CoV-2 viral copies this assay can detect is 138 copies/mL. A negative result does not preclude SARS-Cov-2 infection and should not be used as the sole basis for treatment or other patient management decisions. A negative result may occur with  improper specimen collection/handling, submission of specimen other than nasopharyngeal swab, presence of viral mutation(s) within the areas targeted by this assay, and inadequate number of viral copies(<138 copies/mL). A negative result must be combined with clinical observations, patient history, and epidemiological information. The expected result is Negative.  Fact Sheet for Patients:  EntrepreneurPulse.com.au  Fact Sheet for Healthcare Providers:  IncredibleEmployment.be  This test is no t yet approved or cleared by the [place] FDA and  has been authorized for detection and/or diagnosis of SARS-CoV-2 by FDA under an Emergency Use Authorization (EUA). This EUA will remain  in effect (meaning this test can be used) for the duration of the COVID-19 declaration under Section 564(b)(1) of the Act, 21 U.S.C.section 360bbb-3(b)(1), unless the authorization is terminated  or revoked sooner.       Influenza A by PCR NEGATIVE NEGATIVE Final   Influenza B by PCR NEGATIVE NEGATIVE Final    Comment: (NOTE) The Xpert Xpress SARS-CoV-2/FLU/RSV plus assay is intended as an aid in the diagnosis of influenza from Nasopharyngeal swab specimens and should not be used as a sole basis for treatment. Nasal washings and aspirates are unacceptable for Xpert Xpress SARS-CoV-2/FLU/RSV testing.  Fact Sheet for Patients: EntrepreneurPulse.com.au  Fact Sheet for Healthcare Providers: IncredibleEmployment.be  This test is not yet approved or cleared by the [place] FDA and has been authorized for detection and/or diagnosis  of SARS-CoV-2 by FDA under an Emergency Use  Authorization (EUA). This EUA will remain in effect (meaning this test can be used) for the duration of the COVID-19 declaration under Section 564(b)(1) of the Act, 21 U.S.C. section 360bbb-3(b)(1), unless the authorization is terminated or revoked.  Performed at [facility], [address] [place], [place] [address]     Procedures and diagnostic studies:  No results found.             LOS: 5 days   Nymir Ringler  [facility]   Pager on [email or web address]. If 7PM-7AM, please contact night-coverage at [email or web address]     02/07/2020, 10:44 AM

## 2020-02-07 NOTE — Consult Note (Signed)
   Heart Failure Nurse Navigator Note  HFpEF 60-65 %.  Grade I Diastolic Dysfunction.   She presented from home with complaints of increasing SOB and weight gain, which she states was 30#.  She states that this has been going on for 2 to 3 days she also complains of orthopnea, PND and lower extremity edema.  Her diuretic had been changed from 2-3 times a week to every day in the outpatient setting   Chest  x-ray revealed small bilateral pleural effusions.   Comorbidities:  Chronic kidney disease stage III with baseline creatinine of 1.8-2 Anemia Asthma Morbid obesity Coronary artery disease  Labs:  Sodium 143, potassium 4.5, chloride 99, CO2 34, BUN 62 down from 65 yesterday and creatinine 1.91 down from 2 of yesterday, magnesium 1.9, hemoglobin 8.5 and hematocrit 27.3.  Weight is 91.9(90) Intake 540 mL Output 500 mL BMI 37 Blood pressure 123/37  Assessment:  General-she is lying in bed, no acute distress but states that she still cannot lie flat due to shortness of breath.  She appears more fatigued than when I had talked to her briefly yesterday.  HEENT-pupils are equal, unable to evaluate for JVD due to body habitus.  Cardiac-heart tones of regular rate and rhythm no murmurs or rubs appreciated.  Chest-fine crackles in the bases.  Abdomen rounded and soft nontender is smaller in size than originally noted.  Positive for bowel sounds.  Lower extremities-left lower leg skin is wrinkled, right lower leg remains remains edematous and pitting at 1+.   Psych-she is pleasant and appropriate.  Neurologic-speech is clear.  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  Discussed the importance of daily weights and recording and reporting 2 to 3 pound weight gain in a day or 5 pounds within a week to her physician.  So discussed that his own magnet.  Due to  interruptions teaching was put off until today but today she appears so fatigued we will attempt again tomorrow.   Pricilla Riffle RN, CHFN

## 2020-02-07 NOTE — Progress Notes (Signed)
Mobility Specialist - Progress Note   02/07/20 1600  Mobility  Activity Contraindicated/medical hold  Mobility performed by Mobility specialist    Mobility session contraindicated this date d/t unwitnessed fall. Mobility tech responded to fall and assisted nursing staff with getting pt back into bed. Will hold off on session this date and attempt when appropriate.    Kathee Delton Mobility Specialist 02/07/20, 4:44 PM

## 2020-02-08 ENCOUNTER — Inpatient Hospital Stay: Payer: Medicare Other | Admitting: Oncology

## 2020-02-08 ENCOUNTER — Inpatient Hospital Stay: Payer: Medicare Other

## 2020-02-08 LAB — BLOOD GAS, ARTERIAL
Acid-Base Excess: 16.7 mmol/L — ABNORMAL HIGH (ref 0.0–2.0)
Bicarbonate: 45.3 mmol/L — ABNORMAL HIGH (ref 20.0–28.0)
FIO2: 0.36
O2 Saturation: 98.8 %
Patient temperature: 37
pCO2 arterial: 90 mmHg (ref 32.0–48.0)
pH, Arterial: 7.31 — ABNORMAL LOW (ref 7.350–7.450)
pO2, Arterial: 132 mmHg — ABNORMAL HIGH (ref 83.0–108.0)

## 2020-02-08 LAB — ECHOCARDIOGRAM COMPLETE
AV Mean grad: 12 mmHg
AV Peak grad: 19.7 mmHg
Ao pk vel: 2.22 m/s
Height: 62 in
Weight: 3243.2 oz

## 2020-02-08 MED ORDER — METOLAZONE 2.5 MG PO TABS
2.5000 mg | ORAL_TABLET | Freq: Every day | ORAL | Status: DC
  Administered 2020-02-08 – 2020-02-11 (×4): 2.5 mg via ORAL
  Filled 2020-02-08 (×4): qty 1

## 2020-02-08 MED ORDER — IPRATROPIUM-ALBUTEROL 0.5-2.5 (3) MG/3ML IN SOLN
3.0000 mL | RESPIRATORY_TRACT | Status: DC | PRN
  Administered 2020-02-08: 23:00:00 3 mL via RESPIRATORY_TRACT
  Filled 2020-02-08 (×2): qty 3

## 2020-02-08 NOTE — Progress Notes (Signed)
Mobility Specialist - Progress Note   02/08/20 1601  Mobility  Activity Contraindicated/medical hold  Mobility performed by Mobility specialist    Per discussion with nurse, pt is currently on continuous bi-pap. Will hold session this date and attempt when medically appropriate for mobility.    Kathee Delton Mobility Specialist 02/08/20, 4:02 PM

## 2020-02-08 NOTE — Progress Notes (Signed)
PROGRESS NOTE    Amy [name]  PXT:[id number] DOB: 01-Apr-1927 DOA: 02/02/2020 PCP: [name], MD    Brief Narrative: 86 y.o. female with medical history significant for chronic diastolic CHF, CKD stage IV, hypothyroidism, right arm shingles in May 6270 complicated by postherpetic neuralgia on Lyrica, iron deficiency anemia with recent blood transfusion at the cancer center in November 2021, moderate persistent asthma, who was brought to the hospital because of increasing shortness of breath and lower extremity edema.  She was hypoxic with oxygen saturation of 88% on room air.  She was admitted to the hospital for acute exacerbation of chronic diastolic CHF.  She was treated with IV Lasix and oral metolazone.  She also required up to 5 L/min oxygen via nasal cannula  02/08/2020: Patient lethargic and wheezing this morning.  Remains on 4 L nasal cannula.  Mentation waxing and waning.  Vital signs stable   Assessment & Plan:   Principal Problem:   Acute on chronic diastolic heart failure (HCC) Active Problems:   Acquired hypothyroidism   Iron deficiency anemia   CKD (chronic kidney disease) stage 4, GFR 15-29 ml/min (HCC)   Hyperkalemia   Moderate persistent asthma   SOB (shortness of breath)  Acute on chronic diastolic congestive heart failure Acute hypoxic respiratory failure EF 60-65% with grade 2 diastolic dysfunction and moderate to severe LVH Patient is not been diuresing adequately 3 L net negative over the past 5 days On 80 twice daily IV Lasix Currently requiring 4 L nasal cannula to maintain saturation Repeat chest x-ray with worsening interstitial edema Plan: BiPAP Continue aggressive diuresis furosemide 80 mg IV twice daily Add metolazone 2.5 mg p.o. daily Monitor UOP Target net negative1-1.5 L daily Monitor and replace electrolytes daily Monitor kidney function Supplemental oxygen, wean down as tolerated  Chronic kidney disease stage IV Creatinine  relatively stable however is preventing effective diuresis Will diurese aggressively and involve nephrology should creatinine continue to rise  Iron deficiency anemia Status post 2 doses of IV iron sucrose  Hypothyroidism Continue Synthroid  Hypertension  Continue antihypertensives  Moderate persistent asthma  Continue bronchodilators.  History of persistent right arm in May 3500 complicated by postherpetic neuralgia  Her daughter requested that Lyrica be decreased from total of 150 mg a day to 100 m g a day because pain in right arm is getting better.    DVT prophylaxis: SCD Code Status: DNR Family Communication: Son at bedside Disposition Plan: Status is: Inpatient  Remains inpatient appropriate because:Inpatient level of care appropriate due to severity of illness   Dispo: The patient is from: Home              Anticipated d/c is to: Home              Anticipated d/c date is: 3 days              Patient currently is not medically stable to d/c.  Continues to be severely fluid overloaded.  Several days before disposition planning.       Consultants:  Palliative care Procedures:   None  Antimicrobials:   None   Subjective: Seen and examined.  Appears dyspneic.  Unable to verbalize complaints  Objective: Vitals:   02/08/20 0428 02/08/20 0815 02/08/20 0819 02/08/20 1225  BP: (!) 143/44  (!) 155/25 (!) 160/50  Pulse: 63  70 66  Resp: 20  18 16   Temp: 98.5 F (36.9 C)  97.6 F (36.4 C) 98 F (36.7 C)  TempSrc: Oral  Oral Oral  SpO2: 96% 90% 90% 100%  Weight: 91.6 kg     Height:        Intake/Output Summary (Last 24 hours) at 02/08/2020 1452 Last data filed at 02/08/2020 0445 Gross per 24 hour  Intake 240 ml  Output 100 ml  Net 140 ml   Filed Weights   02/06/20 0417 02/07/20 0419 02/08/20 0428  Weight: 90 kg 91.9 kg 91.6 kg    Examination:  General exam: Mild respiratory distress.  Appears frail Respiratory system: Diffuse bilateral  crackles.  Increased work of breathing.  4 L Cardiovascular system: Tachycardic, regular rhythm, 2/6 systolic murmur  gastrointestinal system: Abdomen is nondistended, soft and nontender. No organomegaly or masses felt. Normal bowel sounds heard. Central nervous system: Lethargic but alert oriented x2.  No focal deficits Extremities: Symmetric 5 x 5 power. Skin: No rashes, lesions or ulcers Psychiatry: Judgement and insight appear impaired. Mood & affect flattened.     Data Reviewed: I have personally reviewed following labs and imaging studies  CBC: Recent Labs  Lab 02/02/20 1221 02/03/20 0315 02/05/20 0853 02/06/20 0529 02/07/20 0452  WBC 5.3 3.8* 4.7 5.2 10.7*  NEUTROABS  --   --  3.0 3.6 9.3*  HGB 8.7* 7.6* 8.1* 7.7* 8.5*  HCT 27.8* 25.4* 26.9* 25.3* 27.3*  MCV 98.9 101.6* 99.3 99.6 99.6  PLT 145* 130* 131* 121* 379*   Basic Metabolic Panel: Recent Labs  Lab 02/02/20 1221 02/03/20 0315 02/05/20 0853 02/06/20 0529 02/07/20 0452  NA 143 144 142 142 143  K 5.6* 5.0 4.4 4.2 4.5  CL 107 108 100 99 99  CO2 28 31 32 34* 34*  GLUCOSE 89 93 114* 91 143*  BUN 65* 64* 68* 65* 62*  CREATININE 2.03* 1.94* 2.06* 2.00* 1.91*  CALCIUM 8.3* 8.1* 8.1* 7.9* 8.2*  MG 2.4 2.3 2.0  --  1.9  PHOS  --  5.1*  --   --   --    GFR: Estimated Creatinine Clearance: 20.6 mL/min (A) (by C-G formula based on SCr of 1.91 mg/dL (H)). Liver Function Tests: Recent Labs  Lab 02/03/20 0315  AST 15  ALT 12  ALKPHOS 66  BILITOT 0.6  PROT 5.1*  ALBUMIN 2.7*   No results for input(s): LIPASE, AMYLASE in the last 168 hours. No results for input(s): AMMONIA in the last 168 hours. Coagulation Profile: Recent Labs  Lab 02/02/20 1124  INR 1.0   Cardiac Enzymes: No results for input(s): CKTOTAL, CKMB, CKMBINDEX, TROPONINI in the last 168 hours. BNP (last 3 results) No results for input(s): PROBNP in the last 8760 hours. HbA1C: No results for input(s): HGBA1C in the last 72  hours. CBG: No results for input(s): GLUCAP in the last 168 hours. Lipid Profile: No results for input(s): CHOL, HDL, LDLCALC, TRIG, CHOLHDL, LDLDIRECT in the last 72 hours. Thyroid Function Tests: No results for input(s): TSH, T4TOTAL, FREET4, T3FREE, THYROIDAB in the last 72 hours. Anemia Panel: No results for input(s): VITAMINB12, FOLATE, FERRITIN, TIBC, IRON, RETICCTPCT in the last 72 hours. Sepsis Labs: No results for input(s): PROCALCITON, LATICACIDVEN in the last 168 hours.  Recent Results (from the past 240 hour(s))  Resp Panel by RT-PCR (Flu A&B, Covid) Nasopharyngeal Swab     Status: None   Collection Time: 02/02/20  4:21 PM   Specimen: Nasopharyngeal Swab; Nasopharyngeal(NP) swabs in vial transport medium  Result Value Ref Range Status   SARS Coronavirus 2 by RT PCR NEGATIVE NEGATIVE Final  Comment: (NOTE) SARS-CoV-2 target nucleic acids are NOT DETECTED.  The SARS-CoV-2 RNA is generally detectable in upper respiratory specimens during the acute phase of infection. The lowest concentration of SARS-CoV-2 viral copies this assay can detect is 138 copies/mL. A negative result does not preclude SARS-Cov-2 infection and should not be used as the sole basis for treatment or other patient management decisions. A negative result may occur with  improper specimen collection/handling, submission of specimen other than nasopharyngeal swab, presence of viral mutation(s) within the areas targeted by this assay, and inadequate number of viral copies(<138 copies/mL). A negative result must be combined with clinical observations, patient history, and epidemiological information. The expected result is Negative.  Fact Sheet for Patients:  EntrepreneurPulse.com.au  Fact Sheet for Healthcare Providers:  IncredibleEmployment.be  This test is no t yet approved or cleared by the [place] FDA and  has been authorized for detection and/or  diagnosis of SARS-CoV-2 by FDA under an Emergency Use Authorization (EUA). This EUA will remain  in effect (meaning this test can be used) for the duration of the COVID-19 declaration under Section 564(b)(1) of the Act, 21 U.S.C.section 360bbb-3(b)(1), unless the authorization is terminated  or revoked sooner.       Influenza A by PCR NEGATIVE NEGATIVE Final   Influenza B by PCR NEGATIVE NEGATIVE Final    Comment: (NOTE) The Xpert Xpress SARS-CoV-2/FLU/RSV plus assay is intended as an aid in the diagnosis of influenza from Nasopharyngeal swab specimens and should not be used as a sole basis for treatment. Nasal washings and aspirates are unacceptable for Xpert Xpress SARS-CoV-2/FLU/RSV testing.  Fact Sheet for Patients: EntrepreneurPulse.com.au  Fact Sheet for Healthcare Providers: IncredibleEmployment.be  This test is not yet approved or cleared by the [place] FDA and has been authorized for detection and/or diagnosis of SARS-CoV-2 by FDA under an Emergency Use Authorization (EUA). This EUA will remain in effect (meaning this test can be used) for the duration of the COVID-19 declaration under Section 564(b)(1) of the Act, 21 U.S.C. section 360bbb-3(b)(1), unless the authorization is terminated or revoked.  Performed at [facility], [address], [place], [place] [address]          Radiology Studies: DG Chest [place] 1 View  Result Date: 02/08/2020 CLINICAL DATA:  Shortness of breath EXAM: PORTABLE CHEST 1 VIEW COMPARISON:  02/07/2020 FINDINGS: Persistent bilateral pleural effusions and adjacent atelectasis. Probable mild pulmonary edema appears similar. No pneumothorax. Stable cardiomediastinal contours. IMPRESSION: Similar appearance of bilateral pleural effusions and adjacent atelectasis and probable mild pulmonary edema. Electronically Signed   By: Macy Mis M.D.   On: 02/08/2020 13:48   DG Chest [place] 1  View  Result Date: 02/07/2020 CLINICAL DATA:  Shortness of breath EXAM: PORTABLE CHEST 1 VIEW COMPARISON:  February 02, 2020 FINDINGS: There is cardiomegaly with pulmonary vascular congestion. There are pleural effusions bilaterally. There is mild interstitial edema. There is ill-defined opacity in the lung bases, likely alveolar edema. There is aortic atherosclerosis. No adenopathy. No bone lesions IMPRESSION: Findings felt to be indicative of congestive heart failure. Airspace opacity in the lung bases is likely due to edema, although superimposed pneumonia in the lung bases cannot be excluded. Aortic Atherosclerosis (ICD10-I70.0). Electronically Signed   By: Lowella Grip III M.D.   On: 02/07/2020 11:30   ECHOCARDIOGRAM COMPLETE  Result Date: 02/08/2020    ECHOCARDIOGRAM REPORT   Patient Name:   Amy [name] Date of Exam: 02/07/2020 Medical Rec #:  [id number]  Height: Accession #:    [id number]         Weight: Date of Birth:  November 06, 1927          BSA: Patient Age:    87 years           BP:           162/43 mmHg Patient Gender: F                  HR:           70 bpm. Exam Location:  [facility] Procedure: 2D Echo, Cardiac Doppler and Color Doppler Indications:     T62.56 Acute Diastolic Heart Failure  History:         Patient has prior history of Echocardiogram examinations, most                  recent 08/14/2019. Risk Factors:Hypertension and Diabetes.                  Thyroid disease. Renal Insufficiency. Chronic kidney disease.                  Dyspnea. Coronary artery disease.  Sonographer:     Wilford Sports Rodgers-Jones Referring Phys:  [id number] Jennye Boroughs Diagnosing Phys: Nelva Bush MD IMPRESSIONS  1. Left ventricular ejection fraction, by estimation, is 55 to 60%. The left ventricle has normal function. The left ventricle has no regional wall motion abnormalities. There is mild left ventricular hypertrophy. Left ventricular diastolic parameters are consistent with Grade II diastolic  dysfunction (pseudonormalization). Elevated left atrial pressure.  2. Right ventricular systolic function is normal. The right ventricular size is mildly enlarged. There is mildly elevated pulmonary artery systolic pressure.  3. Left atrial size was mildly dilated.  4. Right atrial size was mildly dilated.  5. The mitral valve is abnormal. Trivial mitral valve regurgitation. Mild to moderate mitral stenosis. Severe mitral annular calcification.  6. The aortic valve is tricuspid. There is mild calcification of the aortic valve. There is mild thickening of the aortic valve. Aortic valve regurgitation is trivial. Mild aortic valve stenosis. Aortic valve mean gradient measures 12.0 mmHg.  7. The inferior vena cava is normal in size with <50% respiratory variability, suggesting right atrial pressure of 8 mmHg. FINDINGS  Left Ventricle: Left ventricular ejection fraction, by estimation, is 55 to 60%. The left ventricle has normal function. The left ventricle has no regional wall motion abnormalities. The left ventricular internal cavity size was normal in size. There is  mild left ventricular hypertrophy. Left ventricular diastolic parameters are consistent with Grade II diastolic dysfunction (pseudonormalization). Elevated left atrial pressure. Right Ventricle: The right ventricular size is mildly enlarged. No increase in right ventricular wall thickness. Right ventricular systolic function is normal. There is mildly elevated pulmonary artery systolic pressure. The tricuspid regurgitant velocity is 2.92 m/s, and with an assumed right atrial pressure of 8 mmHg, the estimated right ventricular systolic pressure is 42.8 mmHg. Left Atrium: Left atrial size was mildly dilated. Right Atrium: Right atrial size was mildly dilated. Pericardium: There is no evidence of pericardial effusion. Mitral Valve: The mitral valve is abnormal. There is moderate thickening of the mitral valve leaflet(s). There is severe calcification of the  mitral valve leaflet(s). Severe mitral annular calcification. Trivial mitral valve regurgitation. Mild to moderate mitral valve stenosis. Tricuspid Valve: The tricuspid valve is normal in structure. Tricuspid valve regurgitation is mild. Aortic Valve: The aortic valve is tricuspid. There is mild calcification of the  aortic valve. There is mild thickening of the aortic valve. Aortic valve regurgitation is trivial. Mild aortic stenosis is present. Aortic valve mean gradient measures 12.0 mmHg. Aortic valve peak gradient measures 19.7 mmHg. Pulmonic Valve: The pulmonic valve was normal in structure. Pulmonic valve regurgitation is mild. No evidence of pulmonic stenosis. Aorta: The aortic root and ascending aorta are structurally normal, with no evidence of dilitation. Pulmonary Artery: The pulmonary artery is not well seen. Venous: The inferior vena cava is normal in size with less than 50% respiratory variability, suggesting right atrial pressure of 8 mmHg. IAS/Shunts: The interatrial septum was not well visualized.  AORTIC VALVE AV Vmax:      222.00 cm/s AV Peak Grad: 19.7 mmHg AV Mean Grad: 12.0 mmHg TRICUSPID VALVE TR Peak grad:   34.1 mmHg TR Vmax:        292.00 cm/s Nelva Bush MD Electronically signed by Nelva Bush MD Signature Date/Time: 02/08/2020/7:11:06 AM    Final         Scheduled Meds: . amitriptyline  10 mg Oral QHS  . budesonide  0.5 mg Inhalation Daily  . clopidogrel  75 mg Oral Daily  . doxazosin  8 mg Oral Daily  . furosemide  80 mg Intravenous BID  . levothyroxine  100 mcg Oral Q0600  . metolazone  2.5 mg Oral Daily  . mometasone-formoterol  2 puff Inhalation BID  . montelukast  10 mg Oral QHS  . pantoprazole  20 mg Oral Daily  . pregabalin  100 mg Oral QHS  . simvastatin  40 mg Oral QHS   Continuous Infusions:   LOS: 6 days    Time spent: 35 minutes    Sidney Ace, MD [facility] Pager [phone]  If 7PM-7AM, please contact  night-coverage 02/08/2020, 2:52 PM

## 2020-02-09 LAB — BASIC METABOLIC PANEL
Anion gap: 10 (ref 5–15)
BUN: 66 mg/dL — ABNORMAL HIGH (ref 8–23)
CO2: 37 mmol/L — ABNORMAL HIGH (ref 22–32)
Calcium: 8.1 mg/dL — ABNORMAL LOW (ref 8.9–10.3)
Chloride: 95 mmol/L — ABNORMAL LOW (ref 98–111)
Creatinine, Ser: 1.94 mg/dL — ABNORMAL HIGH (ref 0.44–1.00)
GFR, Estimated: 24 mL/min — ABNORMAL LOW (ref >60)
Glucose, Bld: 95 mg/dL (ref 70–99)
Potassium: 4.3 mmol/L (ref 3.5–5.1)
Sodium: 142 mmol/L (ref 135–145)

## 2020-02-09 LAB — CBC WITH DIFFERENTIAL/PLATELET
Abs Immature Granulocytes: 0.02 10*3/uL (ref 0.00–0.07)
Basophils Absolute: 0 10*3/uL (ref 0.0–0.1)
Basophils Relative: 0 %
Eosinophils Absolute: 0.2 10*3/uL (ref 0.0–0.5)
Eosinophils Relative: 4 %
HCT: 26.3 % — ABNORMAL LOW (ref 36.0–46.0)
Hemoglobin: 7.9 g/dL — ABNORMAL LOW (ref 12.0–15.0)
Immature Granulocytes: 0 %
Lymphocytes Relative: 14 %
Lymphs Abs: 0.7 10*3/uL (ref 0.7–4.0)
MCH: 30.3 pg (ref 26.0–34.0)
MCHC: 30 g/dL (ref 30.0–36.0)
MCV: 100.8 fL — ABNORMAL HIGH (ref 80.0–100.0)
Monocytes Absolute: 0.7 10*3/uL (ref 0.1–1.0)
Monocytes Relative: 14 %
Neutro Abs: 3.4 10*3/uL (ref 1.7–7.7)
Neutrophils Relative %: 68 %
Platelets: 123 10*3/uL — ABNORMAL LOW (ref 150–400)
RBC: 2.61 MIL/uL — ABNORMAL LOW (ref 3.87–5.11)
RDW: 16.6 % — ABNORMAL HIGH (ref 11.5–15.5)
WBC: 4.9 10*3/uL (ref 4.0–10.5)
nRBC: 0 % (ref 0.0–0.2)

## 2020-02-09 LAB — PROCALCITONIN: Procalcitonin: 0.17 ng/mL

## 2020-02-09 MED ORDER — CLONIDINE HCL 0.1 MG PO TABS
0.1000 mg | ORAL_TABLET | Freq: Two times a day (BID) | ORAL | Status: DC
  Administered 2020-02-09 – 2020-02-13 (×10): 0.1 mg via ORAL
  Filled 2020-02-09 (×12): qty 1

## 2020-02-09 MED ORDER — ALBUTEROL SULFATE (2.5 MG/3ML) 0.083% IN NEBU
2.5000 mg | INHALATION_SOLUTION | RESPIRATORY_TRACT | Status: DC | PRN
  Administered 2020-02-12 – 2020-02-13 (×2): 2.5 mg via RESPIRATORY_TRACT
  Filled 2020-02-09 (×2): qty 3

## 2020-02-09 MED ORDER — IPRATROPIUM-ALBUTEROL 0.5-2.5 (3) MG/3ML IN SOLN
3.0000 mL | Freq: Three times a day (TID) | RESPIRATORY_TRACT | Status: DC
  Administered 2020-02-09 – 2020-02-11 (×7): 3 mL via RESPIRATORY_TRACT
  Filled 2020-02-09 (×8): qty 3

## 2020-02-09 MED ORDER — SODIUM CHLORIDE 0.9 % IV SOLN
500.0000 mg | INTRAVENOUS | Status: DC
  Administered 2020-02-09 – 2020-02-11 (×3): 500 mg via INTRAVENOUS
  Filled 2020-02-09 (×3): qty 500

## 2020-02-09 NOTE — TOC Progression Note (Signed)
Transition of Care [facility]) - Progression Note    Patient Details  Name: Amy [name] MRN: [id number] Date of Birth: 1928-01-30  Transition of [facility]) CM/SW [place], RN Phone Number: 02/09/2020, 2:18 PM  Clinical Narrative:  Spoke with patient's daughter, [name] [phone]. Patient lives alone in a small home [place], ambulates with cane and walker, however needs a wheelchair to use when going for doctors visits. Patient was receiving Palliative care visit monthly, however daughter unable to remember name of service provider. Daughter also requests Hospital Bed, toilet seat riser, and wheelchair, [employer] notified to provide equipment. Will continue to track for [facility] needs for discharge.          Expected Discharge Plan and Services                                                 Social Determinants of Health (SDOH) Interventions    Readmission Risk Interventions Readmission Risk Prevention Plan 08/23/2019 08/15/2019  Transportation Screening Complete Complete  Medication Review Press photographer) Complete Complete  PCP or Specialist appointment within 3-5 days of discharge Complete Complete  HRI or Home Care Consult Complete Complete  SW Recovery Care/Counseling Consult Complete Complete  Palliative Care Screening Not Applicable Not [place] Complete Not Applicable  Some recent data might be hidden

## 2020-02-09 NOTE — Care Management Important Message (Signed)
Important Message  Patient Details  Name: Amy [name] MRN: [id number] Date of Birth: Oct 26, 1927   Medicare Important Message Given:  Yes     Dannette Barbara 02/09/2020, 1:33 PM

## 2020-02-09 NOTE — Progress Notes (Addendum)
Daily Progress Note   Patient Name: Amy [name]       Date: 02/09/2020 DOB: [birth year]  Age: 86 y.o. MRN#: [id number] Attending Physician: Sidney Ace, MD Primary Care Physician: [name], MD Admit Date: 02/02/2020  Reason for Consultation/Follow-up: Establishing goals of care  Subjective: Patient is sitting in bedside chair. Son is at bedside. She states she lives behind her son. They discuss her decline over the past month. She states she will now require help around the house and with ADL's as she is SOB; discussed her current diagnosis and that her SOB could improve. She is unsure of how she would feel about rehab. We discussed QOL and potential changes to diet and medications. She is unsure of a QOL that would be, or would not be acceptable. Recommend outpatient palliative for further discussions once patient is home and there has been time for outcomes.    Length of Stay: 7  Current Medications: Scheduled Meds:   amitriptyline  10 mg Oral QHS   budesonide  0.5 mg Inhalation Daily   cloNIDine  0.1 mg Oral BID   clopidogrel  75 mg Oral Daily   doxazosin  8 mg Oral Daily   furosemide  80 mg Intravenous BID   ipratropium-albuterol  3 mL Nebulization TID   levothyroxine  100 mcg Oral Q0600   metolazone  2.5 mg Oral Daily   mometasone-formoterol  2 puff Inhalation BID   montelukast  10 mg Oral QHS   pantoprazole  20 mg Oral Daily   pregabalin  100 mg Oral QHS   simvastatin  40 mg Oral QHS    Continuous Infusions:  azithromycin      PRN Meds: acetaminophen **OR** acetaminophen, albuterol, guaiFENesin, melatonin  Physical Exam Pulmonary:     Effort: Pulmonary effort is normal.  Neurological:     Mental Status: She is alert.              Vital Signs: BP (!) 151/38 (BP Location: Right Arm)    Pulse (!) 56    Temp 98.2 F (36.8 C) (Oral)    Resp 16    Ht 5\' 2"  (1.575 m)    Wt 95.3 kg    SpO2 94%    BMI 38.45 kg/m  SpO2: SpO2: 94 % O2 Device: O2 Device:  Nasal Cannula O2 Flow Rate: O2 Flow Rate (L/min): 4 L/min  Intake/output summary:   Intake/Output Summary (Last 24 hours) at 02/09/2020 1235 Last data filed at 02/09/2020 0950 Gross per 24 hour  Intake 900 ml  Output 1775 ml  Net -875 ml   LBM: Last BM Date: 02/06/20 Baseline Weight: Weight: 81.6 kg Most recent weight: Weight: 95.3 kg       Palliative Assessment/Data:      Patient Active Problem List   Diagnosis Date Noted   Acute on chronic diastolic heart failure ([place]) 02/02/2020   Moderate persistent asthma 02/02/2020   SOB (shortness of breath) 02/02/2020   Anemia associated with chronic renal failure 10/22/2019   Postherpetic neuralgia 10/18/2019   Neuropathic pain of chest 10/18/2019   Chronic pain syndrome 10/18/2019   NSTEMI (non-ST elevated myocardial infarction) ([place]) 08/18/2019   Syncope and collapse 08/13/2019   Unresponsiveness 08/12/2019   Hyperkalemia 08/12/2019   HTN (hypertension) 08/12/2019   GERD (gastroesophageal reflux disease) 08/12/2019   Hypothyroidism 08/12/2019   CAD (coronary artery disease) 08/12/2019   Shingles 08/12/2019   Carotid arterial disease (HCC)    Hypotension    Groin pain, right    Chest pain 08/06/2019   Renovascular hypertension 03/08/2019   Renal artery stenosis ([place]) 01/28/2019   Multiple renal cysts 12/29/2018   Renal stone 12/29/2018   CKD (chronic kidney disease) stage 4, GFR 15-29 ml/min ([place]) 11/23/2018   Diabetes ([place]) 06/29/2017   CAP (community acquired pneumonia) 05/16/2017   GI bleed 09/21/2016   Vitamin D deficiency 11/04/2015   Multiple lung nodules    COPD with chronic bronchitis ([place])    New onset atrial fibrillation ([place]) 10/04/2015   Anemia  10/04/2015   Acute kidney injury superimposed on CKD ([place]) 10/03/2015   Palliative care encounter 06/05/2015   Morbid obesity ([place]) 12/05/2014   Angina pectoris associated with type 2 diabetes mellitus ([place]) 12/05/2014   Type 2 diabetes mellitus with hyperglycemia ([place]) 08/22/2014   Chronic diastolic CHF (congestive heart failure) ([place]) 09/06/2013   Preop cardiovascular exam 06/03/2012   Iron deficiency anemia 09/08/2011   HLD (hyperlipidemia) 07/18/2009   Coronary artery disease involving native coronary artery of native heart without angina pectoris 07/18/2009   Acquired hypothyroidism 07/12/2009   Essential hypertension 07/12/2009    Palliative Care Assessment & Plan    Recommendations/Plan:  Recommend outpatient palliative.  Patient is currently a DNR.  Code Status:    Code Status Orders  (From admission, onward)         Start     Ordered   02/02/20 2243  Do not attempt resuscitation (DNR)  Continuous       Question Answer Comment  In the event of cardiac or respiratory ARREST Do not call a code blue   In the event of cardiac or respiratory ARREST Do not perform Intubation, CPR, defibrillation or ACLS   In the event of cardiac or respiratory ARREST Use medication by any route, position, wound care, and other measures to relive pain and suffering. May use oxygen, suction and manual treatment of airway obstruction as needed for comfort.      02/02/20 2244        Code Status History    Date Active Date Inactive Code Status Order ID Comments User Context   02/02/2020 1745 02/02/2020 2244 DNR [id number]  Rhetta Mura, DO ED   08/18/2019 1932 08/23/2019 2056 DNR [id number]  Athena Masse, MD ED   08/12/2019 1610 08/15/2019 1935 DNR  [id number]  Ivor Costa, MD ED   08/12/2019 1447 08/12/2019 1610 DNR [id number]  Merlyn Lot, MD ED   08/06/2019 1455 08/07/2019 1551 DNR [id number]  Max Sane, MD ED   05/16/2017 0217 05/20/2017 1758 DNR [id number]  Lance Coon, MD Inpatient   09/21/2016 1245 09/26/2016 1836 DNR [id number]  [name], MD ED   10/01/2015 1144 10/02/2015 0748 Full Code [id number]  Bettey Costa, MD Inpatient   Advance Care Planning Activity       Prognosis:   Unable to determine    Thank you for allowing the Palliative Medicine Team to assist in the care of this patient.   Total Time 15 min Prolonged Time Billed no       Greater than 50%  of this time was spent counseling and coordinating care related to the above assessment and plan.  Asencion Gowda, NP  Please contact Palliative Medicine Team phone at [phone] for questions and concerns.

## 2020-02-09 NOTE — Plan of Care (Signed)
  Problem: Clinical Measurements: Goal: Will remain free from infection Outcome: Progressing   Problem: Clinical Measurements: Goal: Respiratory complications will improve Outcome: Progressing   Problem: Safety: Goal: Ability to remain free from injury will improve Outcome: Progressing

## 2020-02-09 NOTE — Progress Notes (Signed)
PROGRESS NOTE    Amy [name]  XMI:[id number] DOB: Jan 02, 1928 DOA: 02/02/2020 PCP: [name], MD    Brief Narrative: 86 y.o. female with medical history significant for chronic diastolic CHF, CKD stage IV, hypothyroidism, right arm shingles in May 8250 complicated by postherpetic neuralgia on Lyrica, iron deficiency anemia with recent blood transfusion at the cancer center in November 2021, moderate persistent asthma, who was brought to the hospital because of increasing shortness of breath and lower extremity edema.  She was hypoxic with oxygen saturation of 88% on room air.  She was admitted to the hospital for acute exacerbation of chronic diastolic CHF.  She was treated with IV Lasix and oral metolazone.  She also required up to 5 L/min oxygen via nasal cannula  02/08/2020: Patient lethargic and wheezing this morning.  Remains on 4 L nasal cannula.  Mentation waxing and waning.  Vital signs stable  12/16: Patient responded to BiPAP overnight.  Much more awake and lucid this morning.  Weaned off BiPAP back to 3 to 4 L nasal cannula.   Assessment & Plan:   Principal Problem:   Acute on chronic diastolic heart failure (HCC) Active Problems:   Acquired hypothyroidism   Iron deficiency anemia   CKD (chronic kidney disease) stage 4, GFR 15-29 ml/min (HCC)   Hyperkalemia   Moderate persistent asthma   SOB (shortness of breath)  Acute on chronic diastolic congestive heart failure Acute hypoxic respiratory failure EF 60-65% with grade 2 diastolic dysfunction and moderate to severe LVH Patient is not been diuresing adequately 3 L net negative over the past 5 days On 80 twice daily IV Lasix Currently requiring 4 L nasal cannula to maintain saturation Repeat chest x-ray with worsening interstitial edema BiPAP initiated 02/08/2020 Plan: BiPAP nightly and as needed with naps during the day Continue aggressive diuresis furosemide 80 mg IV twice daily Continue with  metolazone 2.5 mg p.o. daily Monitor UOP Target net negative1-1.5 L daily Monitor and replace electrolytes daily Monitor kidney function Supplemental oxygen, wean down as tolerated  Chronic kidney disease stage IV Creatinine relatively stable however is preventing effective diuresis Will diurese aggressively and involve nephrology should creatinine continue to rise  Iron deficiency anemia Status post 2 doses of IV iron sucrose  Hypothyroidism Continue Synthroid  Hypertension  Continue antihypertensives  Moderate persistent asthma  Continue bronchodilators.  History of persistent right arm in May 0370 complicated by postherpetic neuralgia  Her daughter requested that Lyrica be decreased from total of 150 mg a day to 100 m g a day because pain in right arm is getting better.    DVT prophylaxis: SCD Code Status: DNR Family Communication: Son at bedside 02/08/2020.  Daughter [name] via phone [phone] Disposition Plan: Status is: Inpatient  Remains inpatient appropriate because:Inpatient level of care appropriate due to severity of illness   Dispo: The patient is from: Home              Anticipated d/c is to: Home              Anticipated d/c date is: 2 days              Patient currently is not medically stable to d/c.   Still with evidence of fluid overload.  Diuresis alone was ineffective.  With noninvasive positive pressure ventilation patient appears to be responding and diuresing appropriately.  Anticipate 2 days before disposition planning.   Consultants:  Palliative care Procedures:   None  Antimicrobials:  None   Subjective: Seen and examined.  Work of breathing improved.  Mentation improved.  No pain complaints  Objective: Vitals:   02/09/20 0605 02/09/20 0800 02/09/20 0838 02/09/20 1154  BP:  (!) 146/46  (!) 151/38  Pulse:  60 66 (!) 56  Resp:  19 18 16   Temp:  98.2 F (36.8 C)  98.2 F (36.8 C)  TempSrc:  Oral  Oral  SpO2: 94% 93% 95%  94%  Weight:      Height:        Intake/Output Summary (Last 24 hours) at 02/09/2020 1416 Last data filed at 02/09/2020 0950 Gross per 24 hour  Intake 900 ml  Output 1775 ml  Net -875 ml   Filed Weights   02/07/20 0419 02/08/20 0428 02/09/20 0219  Weight: 91.9 kg 91.6 kg 95.3 kg    Examination:  General exam: No acute distress. Respiratory system: Bibasilar crackles.  Normal work of breathing.  3 L Cardiovascular system: Tachycardic, regular rhythm, 2/6 systolic murmur  gastrointestinal system: Abdomen is nondistended, soft and nontender. No organomegaly or masses felt. Normal bowel sounds heard. Central nervous system: Lethargic but alert oriented x2.  No focal deficits Extremities: Symmetric 5 x 5 power. Skin: No rashes, lesions or ulcers Psychiatry: Judgement and insight appear impaired. Mood & affect flattened.     Data Reviewed: I have personally reviewed following labs and imaging studies  CBC: Recent Labs  Lab 02/03/20 0315 02/05/20 0853 02/06/20 0529 02/07/20 0452 02/09/20 0749  WBC 3.8* 4.7 5.2 10.7* 4.9  NEUTROABS  --  3.0 3.6 9.3* 3.4  HGB 7.6* 8.1* 7.7* 8.5* 7.9*  HCT 25.4* 26.9* 25.3* 27.3* 26.3*  MCV 101.6* 99.3 99.6 99.6 100.8*  PLT 130* 131* 121* 131* 938*   Basic Metabolic Panel: Recent Labs  Lab 02/03/20 0315 02/05/20 0853 02/06/20 0529 02/07/20 0452 02/09/20 0749  NA 144 142 142 143 142  K 5.0 4.4 4.2 4.5 4.3  CL 108 100 99 99 95*  CO2 31 32 34* 34* 37*  GLUCOSE 93 114* 91 143* 95  BUN 64* 68* 65* 62* 66*  CREATININE 1.94* 2.06* 2.00* 1.91* 1.94*  CALCIUM 8.1* 8.1* 7.9* 8.2* 8.1*  MG 2.3 2.0  --  1.9  --   PHOS 5.1*  --   --   --   --    GFR: Estimated Creatinine Clearance: 20.8 mL/min (A) (by C-G formula based on SCr of 1.94 mg/dL (H)). Liver Function Tests: Recent Labs  Lab 02/03/20 0315  AST 15  ALT 12  ALKPHOS 66  BILITOT 0.6  PROT 5.1*  ALBUMIN 2.7*   No results for input(s): LIPASE, AMYLASE in the last 168  hours. No results for input(s): AMMONIA in the last 168 hours. Coagulation Profile: No results for input(s): INR, PROTIME in the last 168 hours. Cardiac Enzymes: No results for input(s): CKTOTAL, CKMB, CKMBINDEX, TROPONINI in the last 168 hours. BNP (last 3 results) No results for input(s): PROBNP in the last 8760 hours. HbA1C: No results for input(s): HGBA1C in the last 72 hours. CBG: No results for input(s): GLUCAP in the last 168 hours. Lipid Profile: No results for input(s): CHOL, HDL, LDLCALC, TRIG, CHOLHDL, LDLDIRECT in the last 72 hours. Thyroid Function Tests: No results for input(s): TSH, T4TOTAL, FREET4, T3FREE, THYROIDAB in the last 72 hours. Anemia Panel: No results for input(s): VITAMINB12, FOLATE, FERRITIN, TIBC, IRON, RETICCTPCT in the last 72 hours. Sepsis Labs: Recent Labs  Lab 02/09/20 0749  PROCALCITON 0.17  Recent Results (from the past 240 hour(s))  Resp Panel by RT-PCR (Flu A&B, Covid) Nasopharyngeal Swab     Status: None   Collection Time: 02/02/20  4:21 PM   Specimen: Nasopharyngeal Swab; Nasopharyngeal(NP) swabs in vial transport medium  Result Value Ref Range Status   SARS Coronavirus 2 by RT PCR NEGATIVE NEGATIVE Final    Comment: (NOTE) SARS-CoV-2 target nucleic acids are NOT DETECTED.  The SARS-CoV-2 RNA is generally detectable in upper respiratory specimens during the acute phase of infection. The lowest concentration of SARS-CoV-2 viral copies this assay can detect is 138 copies/mL. A negative result does not preclude SARS-Cov-2 infection and should not be used as the sole basis for treatment or other patient management decisions. A negative result may occur with  improper specimen collection/handling, submission of specimen other than nasopharyngeal swab, presence of viral mutation(s) within the areas targeted by this assay, and inadequate number of viral copies(<138 copies/mL). A negative result must be combined with clinical  observations, patient history, and epidemiological information. The expected result is Negative.  Fact Sheet for Patients:  EntrepreneurPulse.com.au  Fact Sheet for Healthcare Providers:  IncredibleEmployment.be  This test is no t yet approved or cleared by the [place] FDA and  has been authorized for detection and/or diagnosis of SARS-CoV-2 by FDA under an Emergency Use Authorization (EUA). This EUA will remain  in effect (meaning this test can be used) for the duration of the COVID-19 declaration under Section 564(b)(1) of the Act, 21 U.S.C.section 360bbb-3(b)(1), unless the authorization is terminated  or revoked sooner.       Influenza A by PCR NEGATIVE NEGATIVE Final   Influenza B by PCR NEGATIVE NEGATIVE Final    Comment: (NOTE) The Xpert Xpress SARS-CoV-2/FLU/RSV plus assay is intended as an aid in the diagnosis of influenza from Nasopharyngeal swab specimens and should not be used as a sole basis for treatment. Nasal washings and aspirates are unacceptable for Xpert Xpress SARS-CoV-2/FLU/RSV testing.  Fact Sheet for Patients: EntrepreneurPulse.com.au  Fact Sheet for Healthcare Providers: IncredibleEmployment.be  This test is not yet approved or cleared by the [place] FDA and has been authorized for detection and/or diagnosis of SARS-CoV-2 by FDA under an Emergency Use Authorization (EUA). This EUA will remain in effect (meaning this test can be used) for the duration of the COVID-19 declaration under Section 564(b)(1) of the Act, 21 U.S.C. section 360bbb-3(b)(1), unless the authorization is terminated or revoked.  Performed at [facility], [address] [place], [place] [address]          Radiology Studies: DG Chest [place] 1 View  Result Date: 02/08/2020 CLINICAL DATA:  Shortness of breath EXAM: PORTABLE CHEST 1 VIEW COMPARISON:  02/07/2020 FINDINGS: Persistent  bilateral pleural effusions and adjacent atelectasis. Probable mild pulmonary edema appears similar. No pneumothorax. Stable cardiomediastinal contours. IMPRESSION: Similar appearance of bilateral pleural effusions and adjacent atelectasis and probable mild pulmonary edema. Electronically Signed   By: Macy Mis M.D.   On: 02/08/2020 13:48   ECHOCARDIOGRAM COMPLETE  Result Date: 02/08/2020    ECHOCARDIOGRAM REPORT   Patient Name:   Amy [name] Date of Exam: 02/07/2020 Medical Rec #:  [id number]          Height: Accession #:    [id number]         Weight: Date of Birth:  Jan 09, 1928          BSA: Patient Age:    46 years  BP:           162/43 mmHg Patient Gender: F                  HR:           70 bpm. Exam Location:  [facility] Procedure: 2D Echo, Cardiac Doppler and Color Doppler Indications:     X91.47 Acute Diastolic Heart Failure  History:         Patient has prior history of Echocardiogram examinations, most                  recent 08/14/2019. Risk Factors:Hypertension and Diabetes.                  Thyroid disease. Renal Insufficiency. Chronic kidney disease.                  Dyspnea. Coronary artery disease.  Sonographer:     Wilford Sports Rodgers-Jones Referring Phys:  [id number] Jennye Boroughs Diagnosing Phys: Nelva Bush MD IMPRESSIONS  1. Left ventricular ejection fraction, by estimation, is 55 to 60%. The left ventricle has normal function. The left ventricle has no regional wall motion abnormalities. There is mild left ventricular hypertrophy. Left ventricular diastolic parameters are consistent with Grade II diastolic dysfunction (pseudonormalization). Elevated left atrial pressure.  2. Right ventricular systolic function is normal. The right ventricular size is mildly enlarged. There is mildly elevated pulmonary artery systolic pressure.  3. Left atrial size was mildly dilated.  4. Right atrial size was mildly dilated.  5. The mitral valve is abnormal. Trivial mitral valve regurgitation. Mild  to moderate mitral stenosis. Severe mitral annular calcification.  6. The aortic valve is tricuspid. There is mild calcification of the aortic valve. There is mild thickening of the aortic valve. Aortic valve regurgitation is trivial. Mild aortic valve stenosis. Aortic valve mean gradient measures 12.0 mmHg.  7. The inferior vena cava is normal in size with <50% respiratory variability, suggesting right atrial pressure of 8 mmHg. FINDINGS  Left Ventricle: Left ventricular ejection fraction, by estimation, is 55 to 60%. The left ventricle has normal function. The left ventricle has no regional wall motion abnormalities. The left ventricular internal cavity size was normal in size. There is  mild left ventricular hypertrophy. Left ventricular diastolic parameters are consistent with Grade II diastolic dysfunction (pseudonormalization). Elevated left atrial pressure. Right Ventricle: The right ventricular size is mildly enlarged. No increase in right ventricular wall thickness. Right ventricular systolic function is normal. There is mildly elevated pulmonary artery systolic pressure. The tricuspid regurgitant velocity is 2.92 m/s, and with an assumed right atrial pressure of 8 mmHg, the estimated right ventricular systolic pressure is 13.0 mmHg. Left Atrium: Left atrial size was mildly dilated. Right Atrium: Right atrial size was mildly dilated. Pericardium: There is no evidence of pericardial effusion. Mitral Valve: The mitral valve is abnormal. There is moderate thickening of the mitral valve leaflet(s). There is severe calcification of the mitral valve leaflet(s). Severe mitral annular calcification. Trivial mitral valve regurgitation. Mild to moderate mitral valve stenosis. Tricuspid Valve: The tricuspid valve is normal in structure. Tricuspid valve regurgitation is mild. Aortic Valve: The aortic valve is tricuspid. There is mild calcification of the aortic valve. There is mild thickening of the aortic valve.  Aortic valve regurgitation is trivial. Mild aortic stenosis is present. Aortic valve mean gradient measures 12.0 mmHg. Aortic valve peak gradient measures 19.7 mmHg. Pulmonic Valve: The pulmonic valve was normal in structure. Pulmonic valve regurgitation is mild.  No evidence of pulmonic stenosis. Aorta: The aortic root and ascending aorta are structurally normal, with no evidence of dilitation. Pulmonary Artery: The pulmonary artery is not well seen. Venous: The inferior vena cava is normal in size with less than 50% respiratory variability, suggesting right atrial pressure of 8 mmHg. IAS/Shunts: The interatrial septum was not well visualized.  AORTIC VALVE AV Vmax:      222.00 cm/s AV Peak Grad: 19.7 mmHg AV Mean Grad: 12.0 mmHg TRICUSPID VALVE TR Peak grad:   34.1 mmHg TR Vmax:        292.00 cm/s Nelva Bush MD Electronically signed by Nelva Bush MD Signature Date/Time: 02/08/2020/7:11:06 AM    Final         Scheduled Meds: . amitriptyline  10 mg Oral QHS  . budesonide  0.5 mg Inhalation Daily  . cloNIDine  0.1 mg Oral BID  . clopidogrel  75 mg Oral Daily  . doxazosin  8 mg Oral Daily  . furosemide  80 mg Intravenous BID  . ipratropium-albuterol  3 mL Nebulization TID  . levothyroxine  100 mcg Oral Q0600  . metolazone  2.5 mg Oral Daily  . mometasone-formoterol  2 puff Inhalation BID  . montelukast  10 mg Oral QHS  . pantoprazole  20 mg Oral Daily  . pregabalin  100 mg Oral QHS  . simvastatin  40 mg Oral QHS   Continuous Infusions: . azithromycin 500 mg (02/09/20 1324)     LOS: 7 days    Time spent: 25 minutes    Sidney Ace, MD [facility] Pager [phone]  If 7PM-7AM, please contact night-coverage 02/09/2020, 2:16 PM

## 2020-02-09 NOTE — Progress Notes (Addendum)
Pt bladder scan of 494 per pt report no feeling of  urge to pee at this time.Notify NP Randol Kern.. Will continue to monitor.  Update 0605: NP Randol Kern place in and cath order as needed. Will continue to monitor.  Update 0628: Pt have a in/out cath volume of 815 ml. Zero residual bladder scan. Notify NP Randol Kern. Will continue to monitor.Will continue to monitor.

## 2020-02-09 NOTE — Evaluation (Signed)
Physical Therapy Evaluation Patient Details Name: Amy [name] MRN: [id number] DOB: [birth year] Today's Date: 02/09/2020   History of Present Illness  Aliani Brannick is a 58yoF who comes [facility] on 12/9 c SOB. The patient reports 2 to 3 days of progressive shortness of breath associated with orthopnea and new onset edema in the bilateral lower extremities. PMH: CAD, Cancer, dCHF c LVEF 60-65%, hypoTSH, stg-IV CKD, anemia of chronic disease, asthma, GERD, bilat knee OA. Per TOC note: Patient lives alone in a small home built on the Niece's property, ambulates with cane or walker, needs a wheelchair to use when going for doctors visits. Patient was receiving Palliative care visit monthly,  Daughter also requests Hospital Bed, toilet seat riser, and wheelchair.  Clinical Impression  Pt admitted with above diagnosis. Pt currently with functional limitations due to the deficits listed below (see "PT Problem List"). Upon entry, pt in bed, asleep and agreeable to participate. The pt is drowsy approaching somnolent, pleasant, interactive, and able to provide info regarding prior level of function, both in tolerance and independence. Pt reports significant limitations in mobility since having shingles past 6 months. Patient's performance this date reveals decreased ability, independence, and tolerance in performing all basic mobility required for performance of activities of daily living. Pt requires additional DME, close physical assistance, and cues for safe participate in mobility. A brief STR stay could allow patient to return to home eventually with improved safety and independence. Pt will benefit from skilled PT intervention to increase independence and safety with basic mobility in preparation for discharge to the venue listed below.       Follow Up Recommendations SNF;Supervision for mobility/OOB    Equipment Recommendations  3in1 (PT)    Recommendations for Other Services        Precautions / Restrictions Precautions Precautions: Fall Restrictions Weight Bearing Restrictions: No      Mobility  Bed Mobility Overal bed mobility: Needs Assistance Bed Mobility: Supine to Sit;Sit to Supine     Supine to sit: Mod assist Sit to supine: Max assist   General bed mobility comments: totalA for scooting    Transfers Overall transfer level:  (too fatigued and somnolent to safely attmpt)                  Ambulation/Gait                Stairs            Wheelchair Mobility    Modified Rankin (Stroke Patients Only)       Balance Overall balance assessment: Needs assistance                                           Pertinent Vitals/Pain Pain Assessment: No/denies pain    Home Living Family/patient expects to be discharged to:: Private residence Living Arrangements: Other relatives Available Help at Discharge: Family;Available PRN/intermittently Type of Home: House Home Access: Stairs to enter Entrance Stairs-Rails: Left Entrance Stairs-Number of Steps: 5 Home Layout: One level Home Equipment: Walker - 2 wheels;Walker - 4 wheels;Kasandra Knudsen - single point Additional Comments: Lives in "guest house" on granddaughter's property; family very involved, supportive with care    Prior Function Level of Independence: Independent with assistive device(s)         Comments: Mod indep for ADLs, limited household distances, "furniture grabbing" along pathways.  Utilizes WC for  longer-community distances, but out of house activity limited to MD appointments only.  Denies fall history.  Family provides frequent check in and support.     Hand Dominance   Dominant Hand: Right    Extremity/Trunk Assessment   Upper Extremity Assessment Upper Extremity Assessment: Overall WFL for tasks assessed;LUE deficits/detail LUE Deficits / Details: 4/5 grossly    Lower Extremity Assessment Lower Extremity Assessment: Overall WFL for  tasks assessed    Cervical / Trunk Assessment Cervical / Trunk Assessment:  (only tolerates sitting at EOB for 2 minutes)  Communication   Communication: No difficulties  Cognition Arousal/Alertness: Lethargic;Suspect due to medications                                     General Comments: pt sleepy, difficult time rousing and is fatigued      General Comments      Exercises     Assessment/Plan    PT Assessment Patient needs continued PT services  PT Problem List Decreased strength;Decreased activity tolerance;Decreased mobility;Cardiopulmonary status limiting activity;Obesity       PT Treatment Interventions Functional mobility training;Therapeutic activities;Therapeutic exercise;Gait training;Patient/family education    PT Goals (Current goals can be found in the Care Plan section)  Acute Rehab PT Goals Patient Stated Goal: To return to home PT Goal Formulation: With patient Time For Goal Achievement: 02/23/20 Potential to Achieve Goals: Fair    Frequency Min 2X/week   Barriers to discharge        Co-evaluation               AM-PAC PT "6 Clicks" Mobility  Outcome Measure Help needed turning from your back to your side while in a flat bed without using bedrails?: Total Help needed moving from lying on your back to sitting on the side of a flat bed without using bedrails?: Total Help needed moving to and from a bed to a chair (including a wheelchair)?: Total Help needed standing up from a chair using your arms (e.g., wheelchair or bedside chair)?: Total Help needed to walk in hospital room?: A Lot Help needed climbing 3-5 steps with a railing? : A Lot 6 Click Score: 8    End of Session Equipment Utilized During Treatment: Oxygen Activity Tolerance: Patient limited by fatigue Patient left: in bed;with call bell/phone within reach;with bed alarm set   PT Visit Diagnosis: Difficulty in walking, not elsewhere classified (R26.2);Muscle  weakness (generalized) (M62.81);Other abnormalities of gait and mobility (R26.89)    Time: 8119-1478 PT Time Calculation (min) (ACUTE ONLY): 14 min   Charges:   PT Evaluation $PT Eval Moderate Complexity: 1 Mod          4:56 PM, 02/09/20 Etta Grandchild, PT, DPT Physical Therapist - [facility]  [phone] ([place])    Ruhee Enck C 02/09/2020, 4:54 PM

## 2020-02-10 LAB — CBC WITH DIFFERENTIAL/PLATELET
Abs Immature Granulocytes: 0.02 10*3/uL (ref 0.00–0.07)
Basophils Absolute: 0 10*3/uL (ref 0.0–0.1)
Basophils Relative: 1 %
Eosinophils Absolute: 0.1 10*3/uL (ref 0.0–0.5)
Eosinophils Relative: 3 %
HCT: 28 % — ABNORMAL LOW (ref 36.0–46.0)
Hemoglobin: 8.3 g/dL — ABNORMAL LOW (ref 12.0–15.0)
Immature Granulocytes: 0 %
Lymphocytes Relative: 11 %
Lymphs Abs: 0.6 10*3/uL — ABNORMAL LOW (ref 0.7–4.0)
MCH: 29.6 pg (ref 26.0–34.0)
MCHC: 29.6 g/dL — ABNORMAL LOW (ref 30.0–36.0)
MCV: 100 fL (ref 80.0–100.0)
Monocytes Absolute: 0.7 10*3/uL (ref 0.1–1.0)
Monocytes Relative: 14 %
Neutro Abs: 3.6 10*3/uL (ref 1.7–7.7)
Neutrophils Relative %: 71 %
Platelets: 135 10*3/uL — ABNORMAL LOW (ref 150–400)
RBC: 2.8 MIL/uL — ABNORMAL LOW (ref 3.87–5.11)
RDW: 16.6 % — ABNORMAL HIGH (ref 11.5–15.5)
WBC: 5 10*3/uL (ref 4.0–10.5)
nRBC: 0 % (ref 0.0–0.2)

## 2020-02-10 LAB — BASIC METABOLIC PANEL
Anion gap: 12 (ref 5–15)
BUN: 66 mg/dL — ABNORMAL HIGH (ref 8–23)
CO2: 37 mmol/L — ABNORMAL HIGH (ref 22–32)
Calcium: 8.1 mg/dL — ABNORMAL LOW (ref 8.9–10.3)
Chloride: 93 mmol/L — ABNORMAL LOW (ref 98–111)
Creatinine, Ser: 1.94 mg/dL — ABNORMAL HIGH (ref 0.44–1.00)
GFR, Estimated: 24 mL/min — ABNORMAL LOW (ref >60)
Glucose, Bld: 235 mg/dL — ABNORMAL HIGH (ref 70–99)
Potassium: 4 mmol/L (ref 3.5–5.1)
Sodium: 142 mmol/L (ref 135–145)

## 2020-02-10 MED ORDER — CHLORHEXIDINE GLUCONATE CLOTH 2 % EX PADS
6.0000 | MEDICATED_PAD | Freq: Every day | CUTANEOUS | Status: DC
  Administered 2020-02-10 – 2020-02-14 (×5): 6 via TOPICAL

## 2020-02-10 MED ORDER — BENZONATATE 100 MG PO CAPS
200.0000 mg | ORAL_CAPSULE | Freq: Three times a day (TID) | ORAL | Status: DC
  Administered 2020-02-10 – 2020-02-14 (×12): 200 mg via ORAL
  Filled 2020-02-10 (×13): qty 2

## 2020-02-10 MED ORDER — FUROSEMIDE 10 MG/ML IJ SOLN
60.0000 mg | Freq: Two times a day (BID) | INTRAMUSCULAR | Status: DC
  Administered 2020-02-10 – 2020-02-11 (×2): 60 mg via INTRAVENOUS
  Filled 2020-02-10 (×2): qty 6

## 2020-02-10 NOTE — Evaluation (Signed)
Occupational Therapy Evaluation Patient Details Name: Amy [name] MRN: [id number] DOB: 1927-03-09 Today's Date: 02/10/2020    History of Present Illness Amy [name] is a 70yoF who comes to [facility] on 12/9 c SOB. The patient reports 2 to 3 days of progressive shortness of breath associated with orthopnea and new onset edema in the bilateral lower extremities. PMH: CAD, Cancer, dCHF c LVEF 60-65%, hypoTSH, stg-IV CKD, anemia of chronic disease, asthma, GERD, bilat knee OA. Per TOC note: Patient lives alone in a small home built on the Niece's property, ambulates with cane or walker, needs a wheelchair to use when going for doctors visits. Patient was receiving Palliative care visit monthly,  Daughter also requests Hospital Bed, toilet seat riser, and wheelchair.   Clinical Impression    Patient presenting with decreased I in self care, balance, functional mobility/transfers, endurance, and safety awareness. Patient reports living at home alone at [facility] I level with use of AD. Pt reports family assists with all IADL tasks and takes her to appointments. Patient currently functioning at mod A for functional transfers and short ambulation from bed <> recliner chair. Pt with set up A - min A for UB self care and mod -max A for LB self care. Pt is very fatigued with activity and asleep at end of this session. OT positioned pt in side lying per pt request. Patient will benefit from acute OT to increase overall independence in the areas of ADLs, functional mobility, and safety awareness in order to safely discharge to next venue of care.  Follow Up Recommendations  SNF;Supervision/Assistance - 24 hour    Equipment Recommendations  Other (comment) (none at this time)       Precautions / Restrictions Precautions Precautions: Fall Restrictions Weight Bearing Restrictions: No      Mobility Bed Mobility Overal bed mobility: Needs Assistance Bed Mobility: Sit to Supine       Sit to supine: Max  assist   General bed mobility comments: Assist for B LEs and trunk to return to supine.    Transfers Overall transfer level: Needs assistance Equipment used: Rolling walker (2 wheeled) Transfers: Sit to/from [facility] Sit to Stand: Mod assist Stand pivot transfers: Mod assist       General transfer comment: mod lifting assistance to stand and mod A for RW advancement and balance to transfer from recliner chair back to bed.    Balance Overall balance assessment: Needs assistance Sitting-balance support: Feet supported Sitting balance-Leahy Scale: Fair     Standing balance support: During functional activity Standing balance-Leahy Scale: Poor Standing balance comment: reliance on B UEs                           ADL either performed or assessed with clinical judgement   ADL Overall ADL's : Needs assistance/impaired Eating/Feeding: Modified independent   Grooming: Wash/dry hands;Wash/dry face;Set up;Supervision/safety;Sitting                   Toilet Transfer: Moderate assistance;BSC;Ambulation;RW Toilet Transfer Details (indicate cue type and reason): simulated                 Vision Patient Visual Report: No change from baseline              Pertinent Vitals/Pain Pain Assessment: No/denies pain     Hand Dominance Right   Extremity/Trunk Assessment Upper Extremity Assessment Upper Extremity Assessment: Generalized weakness   Lower Extremity Assessment Lower Extremity  Assessment: Generalized weakness       Communication Communication Communication: No difficulties   Cognition Arousal/Alertness: Awake/alert Behavior During Therapy: WFL for tasks assessed/performed Overall Cognitive Status: Impaired/Different from baseline Area of Impairment: Orientation;Attention;Safety/judgement;Awareness                 Orientation Level: Disoriented to;Place;Time;Situation Current Attention Level: Sustained      Safety/Judgement: Decreased awareness of safety;Decreased awareness of deficits Awareness: Intellectual   General Comments: Pt oriented to person only. Pt appears more fatigued as session continues and more difficult to assess              Home Living Family/patient expects to be discharged to:: Private residence Living Arrangements: Other relatives Available Help at Discharge: Family;Available PRN/intermittently Type of Home: House Home Access: Stairs to enter Entrance Stairs-Number of Steps: 5 Entrance Stairs-Rails: Left Home Layout: One level     Bathroom Shower/Tub: Walk-in shower         Home Equipment: Environmental consultant - 2 wheels;Walker - 4 wheels;Kasandra Knudsen - single point   Additional Comments: Lives in "guest house" on granddaughter's property; family very involved, supportive with care      Prior Functioning/Environment Level of Independence: Independent with assistive device(s)        Comments: Mod indep for ADLs, limited household distances, "furniture grabbing" along pathways.  Utilizes WC for longer-community distances, but out of house activity limited to MD appointments only.  Denies fall history.  Family provides frequent check in and support.        OT Problem List: Decreased strength;Decreased activity tolerance;Decreased safety awareness;Impaired balance (sitting and/or standing);Decreased knowledge of use of DME or AE;Decreased knowledge of precautions;Decreased cognition      OT Treatment/Interventions: Self-care/ADL training;Therapeutic exercise;Therapeutic activities;Energy conservation;Cognitive remediation/compensation;DME and/or AE instruction;Patient/family education;Balance training;Manual therapy    OT Goals(Current goals can be found in the care plan section) Acute Rehab OT Goals Patient Stated Goal: To return to home OT Goal Formulation: With patient Time For Goal Achievement: 02/24/20 Potential to Achieve Goals: Good ADL Goals Pt Will Perform  Grooming: with modified independence;standing Pt Will Perform Lower Body Dressing: with modified independence;sit to/from stand Pt Will Transfer to Toilet: with modified independence;ambulating Pt Will Perform Toileting - Clothing Manipulation and hygiene: with modified independence;sit to/from stand  OT Frequency: Min 1X/week              AM-PAC OT "6 Clicks" Daily Activity     Outcome Measure Help from another person eating meals?: None Help from another person taking care of personal grooming?: A Little Help from another person toileting, which includes using toliet, bedpan, or urinal?: A Lot Help from another person bathing (including washing, rinsing, drying)?: A Lot Help from another person to put on and taking off regular upper body clothing?: A Little Help from another person to put on and taking off regular lower body clothing?: A Lot 6 Click Score: 16   End of Session Equipment Utilized During Treatment: Rolling walker;Oxygen Nurse Communication: Mobility status  Activity Tolerance: Patient limited by fatigue Patient left: in bed;with bed alarm set;with call bell/phone within reach  OT Visit Diagnosis: Muscle weakness (generalized) (M62.81);Unsteadiness on feet (R26.81)                Time: 3267-1245 OT Time Calculation (min): 20 min Charges:  OT General Charges $OT Visit: 1 Visit OT Evaluation $OT Eval Moderate Complexity: 1 Mod OT Treatments $Self Care/Home Management : 8-22 mins  Darleen Crocker, MS, OTR/L , CBIS ascom [phone]  02/10/20, 1:05 PM

## 2020-02-10 NOTE — Progress Notes (Signed)
Bladder scan showing 418mL. Hassan Rowan NP notified. New order placed to insert foley.

## 2020-02-10 NOTE — Progress Notes (Signed)
PROGRESS NOTE    Amy [name]  SNK:[id number] DOB: [birth year] DOA: 02/02/2020 PCP: [name], MD    Brief Narrative: 86 y.o. female with medical history significant for chronic diastolic CHF, CKD stage IV, hypothyroidism, right arm shingles in May 9379 complicated by postherpetic neuralgia on Lyrica, iron deficiency anemia with recent blood transfusion at the cancer center in November 2021, moderate persistent asthma, who was brought to the hospital because of increasing shortness of breath and lower extremity edema.  She was hypoxic with oxygen saturation of 88% on room air.  She was admitted to the hospital for acute exacerbation of chronic diastolic CHF.  She was treated with IV Lasix and oral metolazone.  She also required up to 5 L/min oxygen via nasal cannula  02/08/2020: Patient lethargic and wheezing this morning.  Remains on 4 L nasal cannula.  Mentation waxing and waning.  Vital signs stable  12/16: Patient responded to BiPAP overnight.  Much more awake and lucid this morning.  Weaned off BiPAP back to 3 to 4 L nasal cannula.  12/17: Weaned to 2 L nasal cannula.  Was on BiPAP overnight.  Awake and lucid this morning.   Assessment & Plan:   Principal Problem:   Acute on chronic diastolic heart failure (HCC) Active Problems:   Acquired hypothyroidism   Iron deficiency anemia   CKD (chronic kidney disease) stage 4, GFR 15-29 ml/min (HCC)   Hyperkalemia   Moderate persistent asthma   SOB (shortness of breath)  Acute on chronic diastolic congestive heart failure Acute hypoxic respiratory failure EF 60-65% with grade 2 diastolic dysfunction and moderate to severe LVH Improving after addition of metolazone and initiation of nocturnal BiPAP To 5.7 L since admission Plan: BiPAP nightly and as needed with naps during the day Continue diuresis, furosemide 60 mg IV twice daily Continue with metolazone 2.5 mg p.o. daily Monitor UOP Target net negative1-1.5 L  daily Monitor and replace electrolytes daily Monitor kidney function Supplemental oxygen, wean down as tolerated   Chronic kidney disease stage IV Creatinine relatively stable however is preventing effective diuresis Will diurese aggressively and involve nephrology should creatinine continue to rise  Iron deficiency anemia Status post 2 doses of IV iron sucrose  Hypothyroidism Continue Synthroid  Hypertension  Continue antihypertensives  Moderate persistent asthma  Continue bronchodilators.  History of persistent right arm in May 0240 complicated by postherpetic neuralgia  Her daughter requested that Lyrica be decreased from total of 150 mg a day to 100 m g a day because pain in right arm is getting better.    DVT prophylaxis: SCD Code Status: DNR Family Communication: Daughter at bedside 02/10/2020  disposition Plan: Status is: Inpatient  Remains inpatient appropriate because:Inpatient level of care appropriate due to severity of illness   Dispo: The patient is from: Home              Anticipated d/c is to: Home              Anticipated d/c date is: 2 days              Patient currently is not medically stable to d/c.   Still clinical evidence of fluid overload.  Responding to diuresis and NIPPV.  Will attempt to wean entirely from supplemental oxygen.  Anticipate medical readiness for discharge within 24 to 48 hours.  Anticipated disposition to skilled nursing facility.   Consultants:  Palliative care Procedures:   None  Antimicrobials:   None   Subjective:  Seen and examined.  Daughter at bedside.  Patient sitting up in chair.  Mentating clearly.  Work of breathing normal.  No pain complaints.  Objective: Vitals:   02/10/20 0500 02/10/20 0630 02/10/20 1000 02/10/20 1201  BP: (!) 139/35   (!) 152/75  Pulse: (!) 54   66  Resp: 20   18  Temp: 98.2 F (36.8 C)   97.7 F (36.5 C)  TempSrc: Axillary     SpO2: 99% 96% 90% 98%  Weight: 96 kg      Height:        Intake/Output Summary (Last 24 hours) at 02/10/2020 1458 Last data filed at 02/10/2020 1157 Gross per 24 hour  Intake 617.99 ml  Output 2200 ml  Net -1582.01 ml   Filed Weights   02/08/20 0428 02/09/20 0219 02/10/20 0500  Weight: 91.6 kg 95.3 kg 96 kg    Examination:  General exam: No acute distress. Respiratory system: Bibasilar crackles.  Normal work of breathing.  3 L Cardiovascular system: Tachycardic, regular rhythm, 2/6 systolic murmur  gastrointestinal system: Abdomen is nondistended, soft and nontender. No organomegaly or masses felt. Normal bowel sounds heard. Central nervous system: Lethargic but alert oriented x2.  No focal deficits Extremities: Symmetric 5 x 5 power. Skin: No rashes, lesions or ulcers Psychiatry: Judgement and insight appear impaired. Mood & affect flattened.     Data Reviewed: I have personally reviewed following labs and imaging studies  CBC: Recent Labs  Lab 02/05/20 0853 02/06/20 0529 02/07/20 0452 02/09/20 0749 02/10/20 1118  WBC 4.7 5.2 10.7* 4.9 5.0  NEUTROABS 3.0 3.6 9.3* 3.4 3.6  HGB 8.1* 7.7* 8.5* 7.9* 8.3*  HCT 26.9* 25.3* 27.3* 26.3* 28.0*  MCV 99.3 99.6 99.6 100.8* 100.0  PLT 131* 121* 131* 123* 336*   Basic Metabolic Panel: Recent Labs  Lab 02/05/20 0853 02/06/20 0529 02/07/20 0452 02/09/20 0749 02/10/20 1118  NA 142 142 143 142 142  K 4.4 4.2 4.5 4.3 4.0  CL 100 99 99 95* 93*  CO2 32 34* 34* 37* 37*  GLUCOSE 114* 91 143* 95 235*  BUN 68* 65* 62* 66* 66*  CREATININE 2.06* 2.00* 1.91* 1.94* 1.94*  CALCIUM 8.1* 7.9* 8.2* 8.1* 8.1*  MG 2.0  --  1.9  --   --    GFR: Estimated Creatinine Clearance: 20.8 mL/min (A) (by C-G formula based on SCr of 1.94 mg/dL (H)). Liver Function Tests: No results for input(s): AST, ALT, ALKPHOS, BILITOT, PROT, ALBUMIN in the last 168 hours. No results for input(s): LIPASE, AMYLASE in the last 168 hours. No results for input(s): AMMONIA in the last 168  hours. Coagulation Profile: No results for input(s): INR, PROTIME in the last 168 hours. Cardiac Enzymes: No results for input(s): CKTOTAL, CKMB, CKMBINDEX, TROPONINI in the last 168 hours. BNP (last 3 results) No results for input(s): PROBNP in the last 8760 hours. HbA1C: No results for input(s): HGBA1C in the last 72 hours. CBG: No results for input(s): GLUCAP in the last 168 hours. Lipid Profile: No results for input(s): CHOL, HDL, LDLCALC, TRIG, CHOLHDL, LDLDIRECT in the last 72 hours. Thyroid Function Tests: No results for input(s): TSH, T4TOTAL, FREET4, T3FREE, THYROIDAB in the last 72 hours. Anemia Panel: No results for input(s): VITAMINB12, FOLATE, FERRITIN, TIBC, IRON, RETICCTPCT in the last 72 hours. Sepsis Labs: Recent Labs  Lab 02/09/20 0749  PROCALCITON 0.17    Recent Results (from the past 240 hour(s))  Resp Panel by RT-PCR (Flu A&B, Covid) Nasopharyngeal Swab  Status: None   Collection Time: 02/02/20  4:21 PM   Specimen: Nasopharyngeal Swab; Nasopharyngeal(NP) swabs in vial transport medium  Result Value Ref Range Status   SARS Coronavirus 2 by RT PCR NEGATIVE NEGATIVE Final    Comment: (NOTE) SARS-CoV-2 target nucleic acids are NOT DETECTED.  The SARS-CoV-2 RNA is generally detectable in upper respiratory specimens during the acute phase of infection. The lowest concentration of SARS-CoV-2 viral copies this assay can detect is 138 copies/mL. A negative result does not preclude SARS-Cov-2 infection and should not be used as the sole basis for treatment or other patient management decisions. A negative result may occur with  improper specimen collection/handling, submission of specimen other than nasopharyngeal swab, presence of viral mutation(s) within the areas targeted by this assay, and inadequate number of viral copies(<138 copies/mL). A negative result must be combined with clinical observations, patient history, and epidemiological information. The  expected result is Negative.  Fact Sheet for Patients:  EntrepreneurPulse.com.au  Fact Sheet for Healthcare Providers:  IncredibleEmployment.be  This test is no t yet approved or cleared by the [place] FDA and  has been authorized for detection and/or diagnosis of SARS-CoV-2 by FDA under an Emergency Use Authorization (EUA). This EUA will remain  in effect (meaning this test can be used) for the duration of the COVID-19 declaration under Section 564(b)(1) of the Act, 21 U.S.C.section 360bbb-3(b)(1), unless the authorization is terminated  or revoked sooner.       Influenza A by PCR NEGATIVE NEGATIVE Final   Influenza B by PCR NEGATIVE NEGATIVE Final    Comment: (NOTE) The Xpert Xpress SARS-CoV-2/FLU/RSV plus assay is intended as an aid in the diagnosis of influenza from Nasopharyngeal swab specimens and should not be used as a sole basis for treatment. Nasal washings and aspirates are unacceptable for Xpert Xpress SARS-CoV-2/FLU/RSV testing.  Fact Sheet for Patients: EntrepreneurPulse.com.au  Fact Sheet for Healthcare Providers: IncredibleEmployment.be  This test is not yet approved or cleared by the [place] FDA and has been authorized for detection and/or diagnosis of SARS-CoV-2 by FDA under an Emergency Use Authorization (EUA). This EUA will remain in effect (meaning this test can be used) for the duration of the COVID-19 declaration under Section 564(b)(1) of the Act, 21 U.S.C. section 360bbb-3(b)(1), unless the authorization is terminated or revoked.  Performed at [facility], [address] [place], [place] [address]          Radiology Studies: No results found.      Scheduled Meds: . amitriptyline  10 mg Oral QHS  . benzonatate  200 mg Oral TID  . budesonide  0.5 mg Inhalation Daily  . Chlorhexidine Gluconate Cloth  6 each Topical Daily  . cloNIDine  0.1 mg  Oral BID  . clopidogrel  75 mg Oral Daily  . doxazosin  8 mg Oral Daily  . furosemide  60 mg Intravenous BID  . ipratropium-albuterol  3 mL Nebulization TID  . levothyroxine  100 mcg Oral Q0600  . metolazone  2.5 mg Oral Daily  . mometasone-formoterol  2 puff Inhalation BID  . montelukast  10 mg Oral QHS  . pantoprazole  20 mg Oral Daily  . pregabalin  100 mg Oral QHS  . simvastatin  40 mg Oral QHS   Continuous Infusions: . azithromycin 500 mg (02/10/20 1231)     LOS: 8 days    Time spent: 15 minutes    Sidney Ace, MD [facility] Pager [phone]  If 7PM-7AM,  please contact night-coverage 02/10/2020, 2:58 PM

## 2020-02-11 LAB — CBC WITH DIFFERENTIAL/PLATELET
Abs Immature Granulocytes: 0.02 10*3/uL (ref 0.00–0.07)
Basophils Absolute: 0 10*3/uL (ref 0.0–0.1)
Basophils Relative: 1 %
Eosinophils Absolute: 0.2 10*3/uL (ref 0.0–0.5)
Eosinophils Relative: 5 %
HCT: 26 % — ABNORMAL LOW (ref 36.0–46.0)
Hemoglobin: 7.7 g/dL — ABNORMAL LOW (ref 12.0–15.0)
Immature Granulocytes: 1 %
Lymphocytes Relative: 22 %
Lymphs Abs: 0.9 10*3/uL (ref 0.7–4.0)
MCH: 29.8 pg (ref 26.0–34.0)
MCHC: 29.6 g/dL — ABNORMAL LOW (ref 30.0–36.0)
MCV: 100.8 fL — ABNORMAL HIGH (ref 80.0–100.0)
Monocytes Absolute: 0.7 10*3/uL (ref 0.1–1.0)
Monocytes Relative: 17 %
Neutro Abs: 2.2 10*3/uL (ref 1.7–7.7)
Neutrophils Relative %: 54 %
Platelets: 128 10*3/uL — ABNORMAL LOW (ref 150–400)
RBC: 2.58 MIL/uL — ABNORMAL LOW (ref 3.87–5.11)
RDW: 16.5 % — ABNORMAL HIGH (ref 11.5–15.5)
WBC: 4 10*3/uL (ref 4.0–10.5)
nRBC: 0 % (ref 0.0–0.2)

## 2020-02-11 LAB — BASIC METABOLIC PANEL
Anion gap: 7 (ref 5–15)
BUN: 64 mg/dL — ABNORMAL HIGH (ref 8–23)
CO2: 43 mmol/L — ABNORMAL HIGH (ref 22–32)
Calcium: 7.9 mg/dL — ABNORMAL LOW (ref 8.9–10.3)
Chloride: 93 mmol/L — ABNORMAL LOW (ref 98–111)
Creatinine, Ser: 1.81 mg/dL — ABNORMAL HIGH (ref 0.44–1.00)
GFR, Estimated: 26 mL/min — ABNORMAL LOW (ref >60)
Glucose, Bld: 96 mg/dL (ref 70–99)
Potassium: 4.1 mmol/L (ref 3.5–5.1)
Sodium: 143 mmol/L (ref 135–145)

## 2020-02-11 MED ORDER — METHYLPREDNISOLONE SODIUM SUCC 40 MG IJ SOLR
40.0000 mg | Freq: Two times a day (BID) | INTRAMUSCULAR | Status: AC
  Administered 2020-02-11 – 2020-02-12 (×4): 40 mg via INTRAVENOUS
  Filled 2020-02-11 (×4): qty 1

## 2020-02-11 MED ORDER — ARFORMOTEROL TARTRATE 15 MCG/2ML IN NEBU
15.0000 ug | INHALATION_SOLUTION | Freq: Two times a day (BID) | RESPIRATORY_TRACT | Status: DC
  Administered 2020-02-11 – 2020-02-12 (×2): 15 ug via RESPIRATORY_TRACT
  Filled 2020-02-11 (×3): qty 2

## 2020-02-11 MED ORDER — BUDESONIDE 0.25 MG/2ML IN SUSP
0.2500 mg | Freq: Two times a day (BID) | RESPIRATORY_TRACT | Status: DC
  Administered 2020-02-11 – 2020-02-14 (×6): 0.25 mg via RESPIRATORY_TRACT
  Filled 2020-02-11 (×6): qty 2

## 2020-02-11 MED ORDER — AZITHROMYCIN 250 MG PO TABS
500.0000 mg | ORAL_TABLET | Freq: Every day | ORAL | Status: AC
  Administered 2020-02-12 – 2020-02-13 (×2): 500 mg via ORAL
  Filled 2020-02-11 (×2): qty 2

## 2020-02-11 NOTE — Plan of Care (Signed)
  Problem: Clinical Measurements: Goal: Will remain free from infection Outcome: Progressing   Problem: Coping: Goal: Level of anxiety will decrease Outcome: Progressing   Problem: Pain Managment: Goal: General experience of comfort will improve Outcome: Progressing   Problem: Safety: Goal: Ability to remain free from injury will improve Outcome: Progressing   

## 2020-02-11 NOTE — Progress Notes (Signed)
Pt states it is hard for her to tell if she has to urinate or not laying down. Pt peed standing up moving from the bed to the chair. Pt also peed standing up moving from the chair to the bed.

## 2020-02-11 NOTE — Progress Notes (Signed)
Patient refused bipap. Said she does not want to ever wear again. She said she is using incentive device and sitting in chair at times.

## 2020-02-11 NOTE — Progress Notes (Signed)
PROGRESS NOTE    Amy [name]  JTT:[id number] DOB: 03-21-1927 DOA: 02/02/2020 PCP: [name], MD    Brief Narrative: 86 y.o. female with medical history significant for chronic diastolic CHF, CKD stage IV, hypothyroidism, right arm shingles in May 0092 complicated by postherpetic neuralgia on Lyrica, iron deficiency anemia with recent blood transfusion at the cancer center in November 2021, moderate persistent asthma, who was brought to the hospital because of increasing shortness of breath and lower extremity edema.  She was hypoxic with oxygen saturation of 88% on room air.  She was admitted to the hospital for acute exacerbation of chronic diastolic CHF.  She was treated with IV Lasix and oral metolazone.  She also required up to 5 L/min oxygen via nasal cannula  02/08/2020: Patient lethargic and wheezing this morning.  Remains on 4 L nasal cannula.  Mentation waxing and waning.  Vital signs stable  12/16: Patient responded to BiPAP overnight.  Much more awake and lucid this morning.  Weaned off BiPAP back to 3 to 4 L nasal cannula.  12/17: Weaned to 2 L nasal cannula.  Was on BiPAP overnight.  Awake and lucid this morning.  12/18: Remains on 2 L nasal cannula.  BMP demonstrate significant metabolic alkalosis.  Compliant with BiPAP overnight.  Mentation is returning to baseline however significant adventitious breath sounds and poor airway clearance noted.   Assessment & Plan:   Principal Problem:   Acute on chronic diastolic heart failure (HCC) Active Problems:   Acquired hypothyroidism   Iron deficiency anemia   CKD (chronic kidney disease) stage 4, GFR 15-29 ml/min (HCC)   Hyperkalemia   Moderate persistent asthma   SOB (shortness of breath)  Acute on chronic diastolic congestive heart failure Acute hypoxic respiratory failure EF 60-65% with grade 2 diastolic dysfunction and moderate to severe LVH Improving after addition of metolazone and initiation of  nocturnal BiPAP Net -6.6 L since admission 12/18: Significant metabolic alkalosis noted on BMP Plan: BiPAP nightly and as needed with naps during the day Hold furosemide and metolazone for now given metabolic alkalosis Monitor UOP Monitor and replace electrolytes daily Monitor kidney function Supplemental oxygen, wean down as tolerated May have difficulty titrating the on 2 L Airway clearance devices, incentive spirometry and flutter   Metabolic alkalosis Felt to be contraction alkalosis Likely due to diuresis Diuretics on hold for now Recheck BMP in a.m.   Chronic kidney disease stage IV Creatinine relatively stable however is preventing effective diuresis Diuresis on hold as above  Iron deficiency anemia Status post 2 doses of IV iron sucrose  Hypothyroidism Continue Synthroid  Hypertension  Continue antihypertensives  Moderate persistent asthma Aggressive bronchodilator course Will give 48 hours of intravenous steroids to assist in airway clearance     DVT prophylaxis: SCD Code Status: DNR Family Communication: Daughter at bedside 02/11/2020  disposition Plan:  Status is: Inpatient  Remains inpatient appropriate because:Inpatient level of care appropriate due to severity of illness   Dispo: The patient is from: Home              Anticipated d/c is to: SNF              Anticipated d/c date is: 1 day              Patient currently is not medically stable to d/c.   Patient is diuresed effectively and now has evidence of contraction alkalosis.  Holding off on diuretics for now.  Monitoring and dose status  and respiratory status.  Mental status appears to be slowly improving.  Respiratory status remains somewhat tenuous.  Significant adventitious breath sounds felt to be due to poor airway clearance.  If we are unable to titrate beyond 2 L patient can discharge to skilled nursing facility on this rate.        Consultants:  Palliative care Procedures:    None  Antimicrobials:   None   Subjective: Patient seen and examined.  Daughter at bedside.  Patient sitting up in chair.  Mentating relatively clearly.  Normal work of breathing with significant cough noted.  No pain complaints.  Objective: Vitals:   02/11/20 0635 02/11/20 0709 02/11/20 0752 02/11/20 1310  BP:  (!) 155/37  (!) 150/40  Pulse:  (!) 54  (!) 53  Resp:    18  Temp:  98.1 F (36.7 C)  97.8 F (36.6 C)  TempSrc:  Axillary  Oral  SpO2: 98% 98% 97% 95%  Weight:      Height:        Intake/Output Summary (Last 24 hours) at 02/11/2020 1338 Last data filed at 02/11/2020 0945 Gross per 24 hour  Intake 120 ml  Output 1025 ml  Net -905 ml   Filed Weights   02/09/20 0219 02/10/20 0500 02/11/20 0419  Weight: 95.3 kg 96 kg 90.9 kg    Examination:  General exam: No acute distress. Respiratory system: Scattered coarse crackles.  Normal work of breathing.  2 L Cardiovascular system: Tachycardic, regular rhythm, 2/6 systolic murmur  gastrointestinal system: Abdomen is nondistended, soft and nontender. No organomegaly or masses felt. Normal bowel sounds heard. Central nervous system: Lethargic but alert oriented x2.  No focal deficits Extremities: Symmetric 5 x 5 power. Skin: No rashes, lesions or ulcers Psychiatry: Judgement and insight appear impaired. Mood & affect flattened.     Data Reviewed: I have personally reviewed following labs and imaging studies  CBC: Recent Labs  Lab 02/06/20 0529 02/07/20 0452 02/09/20 0749 02/10/20 1118 02/11/20 0758  WBC 5.2 10.7* 4.9 5.0 4.0  NEUTROABS 3.6 9.3* 3.4 3.6 2.2  HGB 7.7* 8.5* 7.9* 8.3* 7.7*  HCT 25.3* 27.3* 26.3* 28.0* 26.0*  MCV 99.6 99.6 100.8* 100.0 100.8*  PLT 121* 131* 123* 135* 716*   Basic Metabolic Panel: Recent Labs  Lab 02/05/20 0853 02/06/20 0529 02/07/20 0452 02/09/20 0749 02/10/20 1118 02/11/20 0758  NA 142 142 143 142 142 143  K 4.4 4.2 4.5 4.3 4.0 4.1  CL 100 99 99 95* 93* 93*   CO2 32 34* 34* 37* 37* 43*  GLUCOSE 114* 91 143* 95 235* 96  BUN 68* 65* 62* 66* 66* 64*  CREATININE 2.06* 2.00* 1.91* 1.94* 1.94* 1.81*  CALCIUM 8.1* 7.9* 8.2* 8.1* 8.1* 7.9*  MG 2.0  --  1.9  --   --   --    GFR: Estimated Creatinine Clearance: 21.7 mL/min (A) (by C-G formula based on SCr of 1.81 mg/dL (H)). Liver Function Tests: No results for input(s): AST, ALT, ALKPHOS, BILITOT, PROT, ALBUMIN in the last 168 hours. No results for input(s): LIPASE, AMYLASE in the last 168 hours. No results for input(s): AMMONIA in the last 168 hours. Coagulation Profile: No results for input(s): INR, PROTIME in the last 168 hours. Cardiac Enzymes: No results for input(s): CKTOTAL, CKMB, CKMBINDEX, TROPONINI in the last 168 hours. BNP (last 3 results) No results for input(s): PROBNP in the last 8760 hours. HbA1C: No results for input(s): HGBA1C in the last 72 hours. CBG: No  results for input(s): GLUCAP in the last 168 hours. Lipid Profile: No results for input(s): CHOL, HDL, LDLCALC, TRIG, CHOLHDL, LDLDIRECT in the last 72 hours. Thyroid Function Tests: No results for input(s): TSH, T4TOTAL, FREET4, T3FREE, THYROIDAB in the last 72 hours. Anemia Panel: No results for input(s): VITAMINB12, FOLATE, FERRITIN, TIBC, IRON, RETICCTPCT in the last 72 hours. Sepsis Labs: Recent Labs  Lab 02/09/20 0749  PROCALCITON 0.17    Recent Results (from the past 240 hour(s))  Resp Panel by RT-PCR (Flu A&B, Covid) Nasopharyngeal Swab     Status: None   Collection Time: 02/02/20  4:21 PM   Specimen: Nasopharyngeal Swab; Nasopharyngeal(NP) swabs in vial transport medium  Result Value Ref Range Status   SARS Coronavirus 2 by RT PCR NEGATIVE NEGATIVE Final    Comment: (NOTE) SARS-CoV-2 target nucleic acids are NOT DETECTED.  The SARS-CoV-2 RNA is generally detectable in upper respiratory specimens during the acute phase of infection. The lowest concentration of SARS-CoV-2 viral copies this assay can  detect is 138 copies/mL. A negative result does not preclude SARS-Cov-2 infection and should not be used as the sole basis for treatment or other patient management decisions. A negative result may occur with  improper specimen collection/handling, submission of specimen other than nasopharyngeal swab, presence of viral mutation(s) within the areas targeted by this assay, and inadequate number of viral copies(<138 copies/mL). A negative result must be combined with clinical observations, patient history, and epidemiological information. The expected result is Negative.  Fact Sheet for Patients:  EntrepreneurPulse.com.au  Fact Sheet for Healthcare Providers:  IncredibleEmployment.be  This test is no t yet approved or cleared by the [place] FDA and  has been authorized for detection and/or diagnosis of SARS-CoV-2 by FDA under an Emergency Use Authorization (EUA). This EUA will remain  in effect (meaning this test can be used) for the duration of the COVID-19 declaration under Section 564(b)(1) of the Act, 21 U.S.C.section 360bbb-3(b)(1), unless the authorization is terminated  or revoked sooner.       Influenza A by PCR NEGATIVE NEGATIVE Final   Influenza B by PCR NEGATIVE NEGATIVE Final    Comment: (NOTE) The Xpert Xpress SARS-CoV-2/FLU/RSV plus assay is intended as an aid in the diagnosis of influenza from Nasopharyngeal swab specimens and should not be used as a sole basis for treatment. Nasal washings and aspirates are unacceptable for Xpert Xpress SARS-CoV-2/FLU/RSV testing.  Fact Sheet for Patients: EntrepreneurPulse.com.au  Fact Sheet for Healthcare Providers: IncredibleEmployment.be  This test is not yet approved or cleared by the [place] FDA and has been authorized for detection and/or diagnosis of SARS-CoV-2 by FDA under an Emergency Use Authorization (EUA). This EUA will remain in  effect (meaning this test can be used) for the duration of the COVID-19 declaration under Section 564(b)(1) of the Act, 21 U.S.C. section 360bbb-3(b)(1), unless the authorization is terminated or revoked.  Performed at [facility], [address], [place], [place] [address]          Radiology Studies: No results found.      Scheduled Meds: . amitriptyline  10 mg Oral QHS  . arformoterol  15 mcg Nebulization BID  . benzonatate  200 mg Oral TID  . budesonide (PULMICORT) nebulizer solution  0.25 mg Nebulization BID  . Chlorhexidine Gluconate Cloth  6 each Topical Daily  . cloNIDine  0.1 mg Oral BID  . clopidogrel  75 mg Oral Daily  . doxazosin  8 mg Oral Daily  . ipratropium-albuterol  3  mL Nebulization TID  . levothyroxine  100 mcg Oral Q0600  . methylPREDNISolone (SOLU-MEDROL) injection  40 mg Intravenous Q12H  . montelukast  10 mg Oral QHS  . pantoprazole  20 mg Oral Daily  . pregabalin  100 mg Oral QHS  . simvastatin  40 mg Oral QHS   Continuous Infusions: . azithromycin 500 mg (02/11/20 1323)     LOS: 9 days    Time spent: 25 minutes    Sidney Ace, MD [facility] Pager [phone]  If 7PM-7AM, please contact night-coverage 02/11/2020, 1:38 PM

## 2020-02-12 LAB — BLOOD GAS, ARTERIAL
Acid-Base Excess: 17.6 mmol/L — ABNORMAL HIGH (ref 0.0–2.0)
Bicarbonate: 43.8 mmol/L — ABNORMAL HIGH (ref 20.0–28.0)
FIO2: 0.32
O2 Saturation: 89.6 %
Patient temperature: 37
pCO2 arterial: 63 mmHg — ABNORMAL HIGH (ref 32.0–48.0)
pH, Arterial: 7.45 (ref 7.350–7.450)
pO2, Arterial: 55 mmHg — ABNORMAL LOW (ref 83.0–108.0)

## 2020-02-12 LAB — CBC WITH DIFFERENTIAL/PLATELET
Abs Immature Granulocytes: 0.03 10*3/uL (ref 0.00–0.07)
Basophils Absolute: 0 10*3/uL (ref 0.0–0.1)
Basophils Relative: 0 %
Eosinophils Absolute: 0 10*3/uL (ref 0.0–0.5)
Eosinophils Relative: 0 %
HCT: 29.6 % — ABNORMAL LOW (ref 36.0–46.0)
Hemoglobin: 9 g/dL — ABNORMAL LOW (ref 12.0–15.0)
Immature Granulocytes: 1 %
Lymphocytes Relative: 15 %
Lymphs Abs: 0.7 10*3/uL (ref 0.7–4.0)
MCH: 30.1 pg (ref 26.0–34.0)
MCHC: 30.4 g/dL (ref 30.0–36.0)
MCV: 99 fL (ref 80.0–100.0)
Monocytes Absolute: 0.1 10*3/uL (ref 0.1–1.0)
Monocytes Relative: 3 %
Neutro Abs: 3.6 10*3/uL (ref 1.7–7.7)
Neutrophils Relative %: 81 %
Platelets: 160 10*3/uL (ref 150–400)
RBC: 2.99 MIL/uL — ABNORMAL LOW (ref 3.87–5.11)
RDW: 15.9 % — ABNORMAL HIGH (ref 11.5–15.5)
WBC: 4.4 10*3/uL (ref 4.0–10.5)
nRBC: 0 % (ref 0.0–0.2)

## 2020-02-12 LAB — BASIC METABOLIC PANEL WITH GFR
Anion gap: 12 (ref 5–15)
BUN: 62 mg/dL — ABNORMAL HIGH (ref 8–23)
CO2: 38 mmol/L — ABNORMAL HIGH (ref 22–32)
Calcium: 8.4 mg/dL — ABNORMAL LOW (ref 8.9–10.3)
Chloride: 94 mmol/L — ABNORMAL LOW (ref 98–111)
Creatinine, Ser: 1.7 mg/dL — ABNORMAL HIGH (ref 0.44–1.00)
GFR, Estimated: 28 mL/min — ABNORMAL LOW
Glucose, Bld: 185 mg/dL — ABNORMAL HIGH (ref 70–99)
Potassium: 4.2 mmol/L (ref 3.5–5.1)
Sodium: 144 mmol/L (ref 135–145)

## 2020-02-12 MED ORDER — METOLAZONE 5 MG PO TABS
5.0000 mg | ORAL_TABLET | Freq: Every day | ORAL | Status: DC
  Administered 2020-02-12: 10:00:00 5 mg via ORAL
  Filled 2020-02-12 (×2): qty 1

## 2020-02-12 MED ORDER — IPRATROPIUM-ALBUTEROL 0.5-2.5 (3) MG/3ML IN SOLN
3.0000 mL | Freq: Four times a day (QID) | RESPIRATORY_TRACT | Status: DC
  Administered 2020-02-12 – 2020-02-13 (×4): 3 mL via RESPIRATORY_TRACT
  Filled 2020-02-12 (×4): qty 3

## 2020-02-12 MED ORDER — IPRATROPIUM-ALBUTEROL 0.5-2.5 (3) MG/3ML IN SOLN
3.0000 mL | RESPIRATORY_TRACT | Status: DC | PRN
  Administered 2020-02-13: 06:00:00 3 mL via RESPIRATORY_TRACT
  Filled 2020-02-12: qty 3

## 2020-02-12 MED ORDER — FUROSEMIDE 10 MG/ML IJ SOLN
60.0000 mg | Freq: Four times a day (QID) | INTRAMUSCULAR | Status: AC
  Administered 2020-02-12 (×2): 60 mg via INTRAVENOUS
  Filled 2020-02-12 (×2): qty 6

## 2020-02-12 MED ORDER — ACETAZOLAMIDE 250 MG PO TABS
250.0000 mg | ORAL_TABLET | Freq: Two times a day (BID) | ORAL | Status: DC
  Administered 2020-02-12 – 2020-02-14 (×5): 250 mg via ORAL
  Filled 2020-02-12 (×6): qty 1

## 2020-02-12 NOTE — TOC Progression Note (Signed)
Transition of Care [facility]) - Progression Note    Patient Details  Name: Amy [name] MRN: [id number] Date of Birth: 17-Oct-1927  Transition of Care [facility] [place]) CM/SW Contact  Zigmund Daniel Dorian Pod, RN Phone Number:[phone] 02/12/2020, 11:35 AM  Clinical Narrative:    RN spoke with [name] concerning CMS choices for SNF recommendations for PT/OT. Daughter indicated she would prefer the pt SNF be [facility] or [facility]. Caregiver did not wish to fax to other facility for available beds at this time. Completed FL2 and submitted to [facility] however did not send to [facility] due to ALF listing. No other request at this time.  TOC will continue to follow for ongoing discharge needs.         Expected Discharge Plan and Services                                                 Social Determinants of Health (SDOH) Interventions    Readmission Risk Interventions Readmission Risk Prevention Plan 08/23/2019 08/15/2019  Transportation Screening Complete Complete  Medication Review Press photographer) Complete Complete  PCP or Specialist appointment within 3-5 days of discharge Complete Complete  HRI or Home Care Consult Complete Complete  SW Recovery Care/Counseling Consult Complete Complete  Palliative Care Screening Not Applicable Not [place] Complete Not Applicable  Some recent data might be hidden

## 2020-02-12 NOTE — Progress Notes (Signed)
PROGRESS NOTE    Amy [name]  KZS:[id number] DOB: 1927-10-14 DOA: 02/02/2020 PCP: [name], MD    Brief Narrative: 86 y.o. female with medical history significant for chronic diastolic CHF, CKD stage IV, hypothyroidism, right arm shingles in May 7322 complicated by postherpetic neuralgia on Lyrica, iron deficiency anemia with recent blood transfusion at the cancer center in November 2021, moderate persistent asthma, who was brought to the hospital because of increasing shortness of breath and lower extremity edema.  She was hypoxic with oxygen saturation of 88% on room air.  She was admitted to the hospital for acute exacerbation of chronic diastolic CHF.  She was treated with IV Lasix and oral metolazone.  She also required up to 5 L/min oxygen via nasal cannula  02/08/2020: Patient lethargic and wheezing this morning.  Remains on 4 L nasal cannula.  Mentation waxing and waning.  Vital signs stable  12/16: Patient responded to BiPAP overnight.  Much more awake and lucid this morning.  Weaned off BiPAP back to 3 to 4 L nasal cannula.  12/17: Weaned to 2 L nasal cannula.  Was on BiPAP overnight.  Awake and lucid this morning.  12/18: Remains on 2 L nasal cannula.  BMP demonstrate significant metabolic alkalosis.  Compliant with BiPAP overnight.  Mentation is returning to baseline however significant adventitious breath sounds and poor airway clearance noted.   Assessment & Plan:   Principal Problem:   Acute on chronic diastolic heart failure (HCC) Active Problems:   Acquired hypothyroidism   Iron deficiency anemia   CKD (chronic kidney disease) stage 4, GFR 15-29 ml/min (HCC)   Hyperkalemia   Moderate persistent asthma   SOB (shortness of breath)  Acute on chronic diastolic congestive heart failure Acute hypoxic respiratory failure EF 60-65% with grade 2 diastolic dysfunction and moderate to severe LVH Improving after addition of metolazone and initiation of  nocturnal BiPAP Net -6.6 L since admission 12/18: Significant metabolic alkalosis noted on BMP Plan: BiPAP nightly and as needed with naps during the day --resume IV lasix 40 mg BID and metolazone today --Strict I/O --Continue supplemental O2 to keep sats >=90%, wean as tolerated  Contraction alkalosis  Likely due to diuresis Plan: --start Diamox 250 mg BID --resume diuresis as above  Chronic kidney disease stage IV --resume diuresis as above  Iron deficiency anemia Status post 2 doses of IV iron sucrose  Hypothyroidism Continue Synthroid  Hypertension --cont home clonidine  --hold home Lisinopril --cont diuresis  Asthma excerebration  Moderate persistent asthma Aggressive bronchodilator course --cont solumedrol --add DuoNeb QID today  Acute hypercapnic respiratory failure --ABG with pH 7.31 and pCO2 90 --s/p BiPAP Plan: --repeat ABG today showed improvement --BiPAP nightly and PRN   DVT prophylaxis: SCD Code Status: DNR Family Communication:  disposition Plan:  Status is: Inpatient  Remains inpatient appropriate because:Inpatient level of care appropriate due to severity of illness   Dispo: The patient is from: Home              Anticipated d/c is to: SNF              Anticipated d/c date is: 1 day              Patient currently is not medically stable to d/c.  On IV lasix     Consultants:  Palliative care Procedures:   None  Antimicrobials:   None   Subjective: Pt reported feeling ok.  Denied pain.  No N/V.   Objective:  Vitals:   02/12/20 0812 02/12/20 1154 02/12/20 1458 02/12/20 1719  BP: (!) 144/44 (!) 145/47  (!) 148/46  Pulse: 70 62  61  Resp: 19 19    Temp: 97.7 F (36.5 C) 97.6 F (36.4 C)  97.7 F (36.5 C)  TempSrc:    Axillary  SpO2: 96% 95% 96% 98%  Weight:      Height:        Intake/Output Summary (Last 24 hours) at 02/12/2020 1742 Last data filed at 02/12/2020 1350 Gross per 24 hour  Intake 1080 ml  Output 75  ml  Net 1005 ml   Filed Weights   02/10/20 0500 02/11/20 0419 02/12/20 0346  Weight: 96 kg 90.9 kg 87.7 kg    Examination:  Constitutional: NAD, alert, oriented to person and place HEENT: conjunctivae and lids normal, EOMI CV: No cyanosis.   RESP: no wheezes or crackles, holding 3L O2 in her mouth Extremities: pitting edema in BLE SKIN: warm, dry and intact Neuro: II - XII grossly intact.   Psych: Normal mood and affect.      Data Reviewed: I have personally reviewed following labs and imaging studies  CBC: Recent Labs  Lab 02/07/20 0452 02/09/20 0749 02/10/20 1118 02/11/20 0758 02/12/20 0511  WBC 10.7* 4.9 5.0 4.0 4.4  NEUTROABS 9.3* 3.4 3.6 2.2 3.6  HGB 8.5* 7.9* 8.3* 7.7* 9.0*  HCT 27.3* 26.3* 28.0* 26.0* 29.6*  MCV 99.6 100.8* 100.0 100.8* 99.0  PLT 131* 123* 135* 128* 322   Basic Metabolic Panel: Recent Labs  Lab 02/07/20 0452 02/09/20 0749 02/10/20 1118 02/11/20 0758 02/12/20 0511  NA 143 142 142 143 144  K 4.5 4.3 4.0 4.1 4.2  CL 99 95* 93* 93* 94*  CO2 34* 37* 37* 43* 38*  GLUCOSE 143* 95 235* 96 185*  BUN 62* 66* 66* 64* 62*  CREATININE 1.91* 1.94* 1.94* 1.81* 1.70*  CALCIUM 8.2* 8.1* 8.1* 7.9* 8.4*  MG 1.9  --   --   --   --    GFR: Estimated Creatinine Clearance: 22.6 mL/min (A) (by C-G formula based on SCr of 1.7 mg/dL (H)). Liver Function Tests: No results for input(s): AST, ALT, ALKPHOS, BILITOT, PROT, ALBUMIN in the last 168 hours. No results for input(s): LIPASE, AMYLASE in the last 168 hours. No results for input(s): AMMONIA in the last 168 hours. Coagulation Profile: No results for input(s): INR, PROTIME in the last 168 hours. Cardiac Enzymes: No results for input(s): CKTOTAL, CKMB, CKMBINDEX, TROPONINI in the last 168 hours. BNP (last 3 results) No results for input(s): PROBNP in the last 8760 hours. HbA1C: No results for input(s): HGBA1C in the last 72 hours. CBG: No results for input(s): GLUCAP in the last 168 hours. Lipid  Profile: No results for input(s): CHOL, HDL, LDLCALC, TRIG, CHOLHDL, LDLDIRECT in the last 72 hours. Thyroid Function Tests: No results for input(s): TSH, T4TOTAL, FREET4, T3FREE, THYROIDAB in the last 72 hours. Anemia Panel: No results for input(s): VITAMINB12, FOLATE, FERRITIN, TIBC, IRON, RETICCTPCT in the last 72 hours. Sepsis Labs: Recent Labs  Lab 02/09/20 0749  PROCALCITON 0.17    No results found for this or any previous visit (from the past 240 hour(s)).       Radiology Studies: No results found.      Scheduled Meds: . acetaZOLAMIDE  250 mg Oral BID  . amitriptyline  10 mg Oral QHS  . azithromycin  500 mg Oral Daily  . benzonatate  200 mg Oral TID  .  budesonide (PULMICORT) nebulizer solution  0.25 mg Nebulization BID  . Chlorhexidine Gluconate Cloth  6 each Topical Daily  . cloNIDine  0.1 mg Oral BID  . clopidogrel  75 mg Oral Daily  . doxazosin  8 mg Oral Daily  . furosemide  60 mg Intravenous Q6H  . ipratropium-albuterol  3 mL Nebulization QID  . levothyroxine  100 mcg Oral Q0600  . methylPREDNISolone (SOLU-MEDROL) injection  40 mg Intravenous Q12H  . metolazone  5 mg Oral Daily  . montelukast  10 mg Oral QHS  . pantoprazole  20 mg Oral Daily  . pregabalin  100 mg Oral QHS  . simvastatin  40 mg Oral QHS   Continuous Infusions:    LOS: 10 days     Enzo Bi, MD [facility] Pager [phone]  If 7PM-7AM, please contact night-coverage 02/12/2020, 5:42 PM

## 2020-02-12 NOTE — Progress Notes (Signed)
Pt became worked up when phlebotomy came. Pt began grabbing me and kicking me wanting to get on the edge of the bed. Pt stated that she could not breathe and needed to sit up. Myself and Maggie RN pulled pt up in bed and sat her all the way up. Pt stated that it helped. I sent the on call provider a message about receiving a breathing treatment. Pt has PRN albuterol and PRN duoneb. Pt currently receiving albuterol breathing treatment.

## 2020-02-12 NOTE — Progress Notes (Addendum)
Pt has completed albuterol treatment. Pt states that her breathing feels better. I increased pt oxygen from 2L nasal cannula to 3L nasal cannula. Pt 02 now between 97% to 99%. Pt now sleeping.

## 2020-02-12 NOTE — Plan of Care (Signed)
  Problem: Health Behavior/Discharge Planning: Goal: Ability to manage health-related needs will improve Outcome: Progressing   Problem: Activity: Goal: Risk for activity intolerance will decrease Outcome: Progressing   Problem: Education: Goal: Knowledge of General Education information will improve Description: Including pain rating scale, medication(s)/side effects and non-pharmacologic comfort measures Outcome: Progressing   Problem: Health Behavior/Discharge Planning: Goal: Ability to manage health-related needs will improve Outcome: Progressing   Problem: Clinical Measurements: Goal: Ability to maintain clinical measurements within normal limits will improve Outcome: Progressing Goal: Will remain free from infection Outcome: Progressing Goal: Diagnostic test results will improve Outcome: Progressing Goal: Respiratory complications will improve Outcome: Progressing Goal: Cardiovascular complication will be avoided Outcome: Progressing   Problem: Activity: Goal: Risk for activity intolerance will decrease Outcome: Progressing   Problem: Nutrition: Goal: Adequate nutrition will be maintained Outcome: Progressing   Problem: Coping: Goal: Level of anxiety will decrease Outcome: Progressing   Problem: Elimination: Goal: Will not experience complications related to bowel motility Outcome: Progressing Goal: Will not experience complications related to urinary retention Outcome: Progressing   Problem: Pain Managment: Goal: General experience of comfort will improve Outcome: Progressing   Problem: Safety: Goal: Ability to remain free from injury will improve Outcome: Progressing   Problem: Skin Integrity: Goal: Risk for impaired skin integrity will decrease Outcome: Progressing   Problem: Education: Goal: Knowledge of General Education information will improve Description: Including pain rating scale, medication(s)/side effects and non-pharmacologic comfort  measures Outcome: Progressing   Problem: Health Behavior/Discharge Planning: Goal: Ability to manage health-related needs will improve Outcome: Progressing   Problem: Clinical Measurements: Goal: Ability to maintain clinical measurements within normal limits will improve Outcome: Progressing Goal: Will remain free from infection Outcome: Progressing Goal: Diagnostic test results will improve Outcome: Progressing Goal: Respiratory complications will improve Outcome: Progressing Goal: Cardiovascular complication will be avoided Outcome: Progressing   Problem: Activity: Goal: Risk for activity intolerance will decrease Outcome: Progressing   Problem: Nutrition: Goal: Adequate nutrition will be maintained Outcome: Progressing   Problem: Coping: Goal: Level of anxiety will decrease Outcome: Progressing   Problem: Elimination: Goal: Will not experience complications related to bowel motility Outcome: Progressing Goal: Will not experience complications related to urinary retention Outcome: Progressing   Problem: Pain Managment: Goal: General experience of comfort will improve Outcome: Progressing   Problem: Safety: Goal: Ability to remain free from injury will improve Outcome: Progressing   Problem: Skin Integrity: Goal: Risk for impaired skin integrity will decrease Outcome: Progressing

## 2020-02-12 NOTE — Plan of Care (Signed)
Pt back in bed. Pt alert and oriented to person, place, and situation. Pt disoriented to time. Pt got up to the chair twice tonight. Pt pees once stood straight up. I removed the puerwick because pt does not feel the urge to pee while laying down. Pt had a medium sized bowel movement. Pt complains of not being able to breathe. I asked pt if she would like for me to ask the doctor for lasix to help to the fluid down. Pt stated she did not want to receive lasix. Will continue pt care.   Problem: Activity: Goal: Risk for activity intolerance will decrease Outcome: Not Progressing   Problem: Clinical Measurements: Goal: Ability to maintain clinical measurements within normal limits will improve Outcome: Not Progressing Goal: Respiratory complications will improve Outcome: Not Progressing

## 2020-02-12 NOTE — NC FL2 (Signed)
[place] LEVEL OF CARE SCREENING TOOL     IDENTIFICATION  Patient Name: Amy [name] Birthdate: [birth year] Sex: female Admission Date (Current Location): 02/02/2020  [facility] and [place] Number:  Engineering geologist and Address:  [facility], [address] [place], [place] [address]      Provider Number: [id number]  Attending Physician Name and Address:  Enzo Bi, MD  Relative Name and Phone Number:  Allena Pietila [phone]    Current Level of Care: Hospital Recommended Level of Care: [place] Prior Approval Number:    Date Approved/Denied: 10/12/13 PASRR Number: [id number]  Discharge Plan: SNF    Current Diagnoses: Patient Active Problem List   Diagnosis Date Noted   Acute on chronic diastolic heart failure ([place]) 02/02/2020   Moderate persistent asthma 02/02/2020   SOB (shortness of breath) 02/02/2020   Anemia associated with chronic renal failure 10/22/2019   Postherpetic neuralgia 10/18/2019   Neuropathic pain of chest 10/18/2019   Chronic pain syndrome 10/18/2019   NSTEMI (non-ST elevated myocardial infarction) ([place]) 08/18/2019   Syncope and collapse 08/13/2019   Unresponsiveness 08/12/2019   Hyperkalemia 08/12/2019   HTN (hypertension) 08/12/2019   GERD (gastroesophageal reflux disease) 08/12/2019   Hypothyroidism 08/12/2019   CAD (coronary artery disease) 08/12/2019   Shingles 08/12/2019   Carotid arterial disease (HCC)    Hypotension    Groin pain, right    Chest pain 08/06/2019   Renovascular hypertension 03/08/2019   Renal artery stenosis ([place]) 01/28/2019   Multiple renal cysts 12/29/2018   Renal stone 12/29/2018   CKD (chronic kidney disease) stage 4, GFR 15-29 ml/min ([place]) 11/23/2018   Diabetes ([place]) 06/29/2017   CAP (community acquired pneumonia) 05/16/2017   GI bleed 09/21/2016   Vitamin D deficiency 11/04/2015   Multiple lung nodules    COPD  with chronic bronchitis ([place])    New onset atrial fibrillation ([place]) 10/04/2015   Anemia 10/04/2015   Acute kidney injury superimposed on CKD ([place]) 10/03/2015   Palliative care encounter 06/05/2015   Morbid obesity ([place]) 12/05/2014   Angina pectoris associated with type 2 diabetes mellitus ([place]) 12/05/2014   Type 2 diabetes mellitus with hyperglycemia ([place]) 08/22/2014   Chronic diastolic CHF (congestive heart failure) ([place]) 09/06/2013   Preop cardiovascular exam 06/03/2012   Iron deficiency anemia 09/08/2011   HLD (hyperlipidemia) 07/18/2009   Coronary artery disease involving native coronary artery of native heart without angina pectoris 07/18/2009   Acquired hypothyroidism 07/12/2009   Essential hypertension 07/12/2009    Orientation RESPIRATION BLADDER Height & Weight     Self,Place,Situation  O2 (2-3 liters N/C and good tolerance with Bipap at night) Continent (hx straight cath and bladder scan) Weight: 87.7 kg Height:  5\' 2"  (157.5 cm)  BEHAVIORAL SYMPTOMS/MOOD NEUROLOGICAL BOWEL NUTRITION STATUS      Continent Diet  AMBULATORY STATUS COMMUNICATION OF NEEDS Skin   Limited Assist Verbally Normal                       Personal Care Assistance Level of Assistance  Bathing,Feeding,Dressing Bathing Assistance: Limited assistance Feeding assistance: Limited assistance Dressing Assistance: Limited assistance     Functional Limitations Info             SPECIAL CARE FACTORS FREQUENCY                       Contractures      Additional Factors Info  Current Medications (02/12/2020):  This is the current hospital active medication list Current Facility-Administered Medications  Medication Dose Route Frequency Provider Last Rate Last Admin   acetaminophen (TYLENOL) tablet 650 mg  650 mg Oral Q6H PRN Howerter, Justin B, DO   650 mg at 02/10/20 1717   Or   acetaminophen (TYLENOL) suppository 650 mg  650 mg Rectal Q6H PRN  Howerter, Justin B, DO       acetaZOLAMIDE (DIAMOX) tablet 250 mg  250 mg Oral BID Enzo Bi, MD   250 mg at 02/12/20 1023   albuterol (PROVENTIL) (2.5 MG/3ML) 0.083% nebulizer solution 2.5 mg  2.5 mg Nebulization Q4H PRN Ralene Muskrat B, MD   2.5 mg at 02/12/20 0522   amitriptyline (ELAVIL) tablet 10 mg  10 mg Oral QHS Howerter, Justin B, DO   10 mg at 02/11/20 2138   azithromycin (ZITHROMAX) tablet 500 mg  500 mg Oral Daily Oswald Hillock, RPH   500 mg at 02/12/20 1024   benzonatate (TESSALON) capsule 200 mg  200 mg Oral TID Ralene Muskrat B, MD   200 mg at 02/12/20 1024   budesonide (PULMICORT) nebulizer solution 0.25 mg  0.25 mg Nebulization BID Ralene Muskrat B, MD   0.25 mg at 02/12/20 1275   Chlorhexidine Gluconate Cloth 2 % PADS 6 each  6 each Topical Daily Ralene Muskrat B, MD   6 each at 02/12/20 1032   cloNIDine (CATAPRES) tablet 0.1 mg  0.1 mg Oral BID Ralene Muskrat B, MD   0.1 mg at 02/12/20 1025   clopidogrel (PLAVIX) tablet 75 mg  75 mg Oral Daily Howerter, Justin B, DO   75 mg at 02/12/20 1025   doxazosin (CARDURA) tablet 8 mg  8 mg Oral Daily Howerter, Justin B, DO   8 mg at 02/12/20 1032   furosemide (LASIX) injection 60 mg  60 mg Intravenous Q6H Enzo Bi, MD       guaiFENesin (ROBITUSSIN) 100 MG/5ML solution 100 mg  5 mL Oral Q4H PRN Jennye Boroughs, MD   100 mg at 02/12/20 1040   ipratropium-albuterol (DUONEB) 0.5-2.5 (3) MG/3ML nebulizer solution 3 mL  3 mL Nebulization Q4H PRN Sharion Settler, NP       ipratropium-albuterol (DUONEB) 0.5-2.5 (3) MG/3ML nebulizer solution 3 mL  3 mL Nebulization QID Enzo Bi, MD       levothyroxine (SYNTHROID) tablet 100 mcg  100 mcg Oral Q0600 Jennye Boroughs, MD   100 mcg at 02/12/20 0522   melatonin tablet 5 mg  5 mg Oral QHS PRN Lang Snow, NP   5 mg at 02/07/20 0116   methylPREDNISolone sodium succinate (SOLU-MEDROL) 40 mg/mL injection 40 mg  40 mg Intravenous Q12H Ralene Muskrat B, MD   40  mg at 02/12/20 1019   metolazone (ZAROXOLYN) tablet 5 mg  5 mg Oral Daily Enzo Bi, MD   5 mg at 02/12/20 1029   montelukast (SINGULAIR) tablet 10 mg  10 mg Oral QHS Howerter, Justin B, DO   10 mg at 02/11/20 2131   pantoprazole (PROTONIX) EC tablet 20 mg  20 mg Oral Daily Howerter, Justin B, DO   20 mg at 02/12/20 1030   pregabalin (LYRICA) capsule 100 mg  100 mg Oral QHS Howerter, Justin B, DO   100 mg at 02/11/20 2130   simvastatin (ZOCOR) tablet 40 mg  40 mg Oral QHS Howerter, Justin B, DO   40 mg at 02/11/20 2131   Facility-Administered Medications Ordered in Other Encounters  Medication Dose Route Frequency Provider Last Rate Last Admin   0.9 %  sodium chloride infusion   Intravenous Continuous Grayland Ormond, Kathlene November, MD       epoetin alfa-epbx (RETACRIT) injection 40,000 Units  40,000 Units Subcutaneous Once Lloyd Huger, MD         Discharge Medications: Please see discharge summary for a list of discharge medications.  Relevant Imaging Results:  Relevant Lab Results:   Additional Information WT#:[id number]  Harriet Masson, RN

## 2020-02-12 NOTE — Progress Notes (Signed)
Mobility Specialist - Progress Note   02/12/20 1400  Mobility  Activity Refused mobility  Mobility performed by Mobility specialist    Pre-mobility: 64 HR, 95% SpO2   Pt was sleeping in bed upon arrival with [place] on 3L, but doffed. Pt awakened to touch, but would not keep eyes open. Pt declined mobility this date d/t fatigue. Oxygen was monitored before exit, sats sitting at 95% while still doffed. Mobility adjusted [place] to maintain position in nostrils. Pt back asleep prior to exit.    Kathee Delton Mobility Specialist 02/12/20, 3:01 PM

## 2020-02-13 LAB — BASIC METABOLIC PANEL
Anion gap: 11 (ref 5–15)
BUN: 73 mg/dL — ABNORMAL HIGH (ref 8–23)
CO2: 41 mmol/L — ABNORMAL HIGH (ref 22–32)
Calcium: 8.5 mg/dL — ABNORMAL LOW (ref 8.9–10.3)
Chloride: 91 mmol/L — ABNORMAL LOW (ref 98–111)
Creatinine, Ser: 1.82 mg/dL — ABNORMAL HIGH (ref 0.44–1.00)
GFR, Estimated: 26 mL/min — ABNORMAL LOW (ref >60)
Glucose, Bld: 164 mg/dL — ABNORMAL HIGH (ref 70–99)
Potassium: 4.6 mmol/L (ref 3.5–5.1)
Sodium: 143 mmol/L (ref 135–145)

## 2020-02-13 LAB — CBC
HCT: 28 % — ABNORMAL LOW (ref 36.0–46.0)
Hemoglobin: 8.5 g/dL — ABNORMAL LOW (ref 12.0–15.0)
MCH: 30.2 pg (ref 26.0–34.0)
MCHC: 30.4 g/dL (ref 30.0–36.0)
MCV: 99.6 fL (ref 80.0–100.0)
Platelets: 162 10*3/uL (ref 150–400)
RBC: 2.81 MIL/uL — ABNORMAL LOW (ref 3.87–5.11)
RDW: 16 % — ABNORMAL HIGH (ref 11.5–15.5)
WBC: 6.4 10*3/uL (ref 4.0–10.5)
nRBC: 0 % (ref 0.0–0.2)

## 2020-02-13 LAB — MAGNESIUM: Magnesium: 1.8 mg/dL (ref 1.7–2.4)

## 2020-02-13 MED ORDER — IPRATROPIUM-ALBUTEROL 0.5-2.5 (3) MG/3ML IN SOLN
3.0000 mL | Freq: Three times a day (TID) | RESPIRATORY_TRACT | Status: DC
  Administered 2020-02-13 – 2020-02-14 (×2): 3 mL via RESPIRATORY_TRACT
  Filled 2020-02-13 (×2): qty 3

## 2020-02-13 MED ORDER — TORSEMIDE 20 MG PO TABS
20.0000 mg | ORAL_TABLET | Freq: Every day | ORAL | Status: DC
  Administered 2020-02-13 – 2020-02-14 (×2): 20 mg via ORAL
  Filled 2020-02-13 (×2): qty 1

## 2020-02-13 NOTE — Plan of Care (Signed)
No acute events overnight. Pt is very active in bed and it causes the purwick to slide out of place. Pt had multiple occurrences. Only minimal amount collected in canister.   Problem: Health Behavior/Discharge Planning: Goal: Ability to manage health-related needs will improve Outcome: Progressing   Problem: Activity: Goal: Risk for activity intolerance will decrease Outcome: Progressing   Problem: Education: Goal: Knowledge of General Education information will improve Description: Including pain rating scale, medication(s)/side effects and non-pharmacologic comfort measures Outcome: Progressing   Problem: Health Behavior/Discharge Planning: Goal: Ability to manage health-related needs will improve Outcome: Progressing   Problem: Clinical Measurements: Goal: Ability to maintain clinical measurements within normal limits will improve Outcome: Progressing Goal: Will remain free from infection Outcome: Progressing Goal: Diagnostic test results will improve Outcome: Progressing Goal: Respiratory complications will improve Outcome: Progressing Goal: Cardiovascular complication will be avoided Outcome: Progressing   Problem: Activity: Goal: Risk for activity intolerance will decrease Outcome: Progressing   Problem: Nutrition: Goal: Adequate nutrition will be maintained Outcome: Progressing   Problem: Coping: Goal: Level of anxiety will decrease Outcome: Progressing   Problem: Elimination: Goal: Will not experience complications related to bowel motility Outcome: Progressing Goal: Will not experience complications related to urinary retention Outcome: Progressing   Problem: Pain Managment: Goal: General experience of comfort will improve Outcome: Progressing   Problem: Safety: Goal: Ability to remain free from injury will improve Outcome: Progressing   Problem: Skin Integrity: Goal: Risk for impaired skin integrity will decrease Outcome: Progressing   Problem:  Education: Goal: Knowledge of General Education information will improve Description: Including pain rating scale, medication(s)/side effects and non-pharmacologic comfort measures Outcome: Progressing   Problem: Health Behavior/Discharge Planning: Goal: Ability to manage health-related needs will improve Outcome: Progressing   Problem: Clinical Measurements: Goal: Ability to maintain clinical measurements within normal limits will improve Outcome: Progressing Goal: Will remain free from infection Outcome: Progressing Goal: Diagnostic test results will improve Outcome: Progressing Goal: Respiratory complications will improve Outcome: Progressing Goal: Cardiovascular complication will be avoided Outcome: Progressing   Problem: Activity: Goal: Risk for activity intolerance will decrease Outcome: Progressing   Problem: Nutrition: Goal: Adequate nutrition will be maintained Outcome: Progressing   Problem: Coping: Goal: Level of anxiety will decrease Outcome: Progressing   Problem: Elimination: Goal: Will not experience complications related to bowel motility Outcome: Progressing Goal: Will not experience complications related to urinary retention Outcome: Progressing   Problem: Pain Managment: Goal: General experience of comfort will improve Outcome: Progressing   Problem: Safety: Goal: Ability to remain free from injury will improve Outcome: Progressing   Problem: Skin Integrity: Goal: Risk for impaired skin integrity will decrease Outcome: Progressing

## 2020-02-13 NOTE — Progress Notes (Signed)
Occupational Therapy Treatment Patient Details Name: LAKEVA HOLLON MRN: [id number] DOB: 06/19/1927 Today's Date: 02/13/2020    History of present illness Jax Skilton is a 77yoF who [facility] 12/[facility] c SOB. The patient reports 2 to 3 days of progressive shortness of breath associated with orthopnea and new onset edema in the bilateral lower extremities. PMH: CAD, Cancer, dCHF c LVEF 60-65%, hypoTSH, stg-IV CKD, anemia of chronic disease, asthma, GERD, bilat knee OA. Per TOC note: Patient lives alone in a small home built on the Niece's property, ambulates with cane or walker, needs a wheelchair to use when going for doctors visits. Patient was receiving Palliative care visit monthly,  Daughter also requests Hospital Bed, toilet seat riser, and wheelchair.   OT comments  Upon entering the room, pt supine in bed with no c/o pain and needing encouragement for OT intervention this session. Pt on 4 L via [place] this session. Pt performed supine >sit with mod A for trunk support and LE assistance. Pt sitting on EOB for grooming tasks with set up A. Pt impulsive once seated on EOB and attempting to stand multiple times without assistance. RW placed in front of pt but she does not use and instead grabs for recliner chair. Pt needing mod A for safety with transfer. O2 placed into pt's mouth vs nose. Pt unable to recover O2 past 84% and needing to be increased to 5L. Once recovered, pt able to return to 4L and maintain. Pt coughing and reports pain when coughing. RN notified. Pt fatigues very quickly this session but continues to benefit from OT intervention.    Follow Up Recommendations  SNF;Supervision/Assistance - 24 hour    Equipment Recommendations  Other (comment) (defer to next venue of care)       Precautions / Restrictions Precautions Precautions: Fall Restrictions Weight Bearing Restrictions: No       Mobility Bed Mobility Overal bed mobility: Needs Assistance Bed Mobility:  Rolling Rolling: Mod assist   Supine to sit: Mod assist     General bed mobility comments: assistance with B LEs and trunk for supine >sit  Transfers Overall transfer level: Needs assistance Equipment used: Rolling walker (2 wheeled) Transfers: Sit to/from [facility] Sit to Stand: Mod assist Stand pivot transfers: Min assist       General transfer comment: mod lifting assistance to stand from bed surface with min A for transfer    Balance Overall balance assessment: Needs assistance Sitting-balance support: Feet supported Sitting balance-Leahy Scale: Fair     Standing balance support: During functional activity Standing balance-Leahy Scale: Poor                             ADL either performed or assessed with clinical judgement   ADL Overall ADL's : Needs assistance/impaired     Grooming: Wash/dry hands;Wash/dry face;Set up;Supervision/safety;Sitting                                       Vision Patient Visual Report: No change from baseline            Cognition Arousal/Alertness: Awake/alert Behavior During Therapy: Impulsive Overall Cognitive Status: Impaired/Different from baseline Area of Impairment: Orientation;Attention;Safety/judgement;Awareness;Following commands                 Orientation Level: Disoriented to;Time;Situation Current Attention Level: Sustained   Following  Commands: Follows one step commands inconsistently;Follows one step commands with increased time Safety/Judgement: Decreased awareness of safety;Decreased awareness of deficits Awareness: Intellectual   General Comments: Very impulsive with mobility and needing additional cuing for safety                   Pertinent Vitals/ Pain       Pain Assessment: Faces Faces Pain Scale: Hurts little more Pain Location: chest when coughing Pain Descriptors / Indicators: Discomfort;Sore Pain Intervention(s): Limited activity within  patient's tolerance;Repositioned;Monitored during session         Frequency  Min 1X/week        Progress Toward Goals  OT Goals(current goals can now be found in the care plan section)  Progress towards OT goals: Progressing toward goals  Acute Rehab OT Goals Patient Stated Goal: To return to home OT Goal Formulation: With patient Time For Goal Achievement: 02/24/20 Potential to Achieve Goals: Good  Plan Discharge plan remains appropriate       AM-PAC OT "6 Clicks" Daily Activity     Outcome Measure   Help from another person eating meals?: None Help from another person taking care of personal grooming?: A Little Help from another person toileting, which includes using toliet, bedpan, or urinal?: A Lot Help from another person bathing (including washing, rinsing, drying)?: A Lot Help from another person to put on and taking off regular upper body clothing?: A Little Help from another person to put on and taking off regular lower body clothing?: A Lot 6 Click Score: 16    End of Session Equipment Utilized During Treatment: Rolling walker;Oxygen  OT Visit Diagnosis: Muscle weakness (generalized) (M62.81);Unsteadiness on feet (R26.81)   Activity Tolerance Patient limited by fatigue   Patient Left with call bell/phone within reach;in chair;with chair alarm set   Nurse Communication Mobility status;Other (comment) (oxygen needs)        Time: 6160-7371 OT Time Calculation (min): 28 min  Charges: OT General Charges $OT Visit: 1 Visit OT Treatments $Self Care/Home Management : 23-37 mins  Darleen Crocker, MS, OTR/L , CBIS ascom [phone]  02/13/20, 1:08 PM

## 2020-02-13 NOTE — Progress Notes (Signed)
Assisted patient from Recliner to Bed. Patient incontinent upon ambulation. Large amount of urine voided into floor. Unable to collect exact amount. Patient in bed with Purewick in place now.

## 2020-02-13 NOTE — Progress Notes (Signed)
PROGRESS NOTE    Amy [name]  EXH:[id number] DOB: May 10, 1927 DOA: 02/02/2020 PCP: [name], MD    Brief Narrative: 86 y.o. female with medical history significant for chronic diastolic CHF, CKD stage IV, hypothyroidism, right arm shingles in May 3810 complicated by postherpetic neuralgia on Lyrica, iron deficiency anemia with recent blood transfusion at the cancer center in November 2021, moderate persistent asthma, who was brought to the hospital because of increasing shortness of breath and lower extremity edema.  She was hypoxic with oxygen saturation of 88% on room air.  She was admitted to the hospital for acute exacerbation of chronic diastolic CHF.  She was treated with IV Lasix and oral metolazone.  She also required up to 5 L/min oxygen via nasal cannula  02/08/2020: Patient lethargic and wheezing this morning.  Remains on 4 L nasal cannula.  Mentation waxing and waning.  Vital signs stable  12/16: Patient responded to BiPAP overnight.  Much more awake and lucid this morning.  Weaned off BiPAP back to 3 to 4 L nasal cannula.  12/17: Weaned to 2 L nasal cannula.  Was on BiPAP overnight.  Awake and lucid this morning.  12/18: Remains on 2 L nasal cannula.  BMP demonstrate significant metabolic alkalosis.  Compliant with BiPAP overnight.  Mentation is returning to baseline however significant adventitious breath sounds and poor airway clearance noted.   Assessment & Plan:   Principal Problem:   Acute on chronic diastolic heart failure (HCC) Active Problems:   Acquired hypothyroidism   Iron deficiency anemia   CKD (chronic kidney disease) stage 4, GFR 15-29 ml/min (HCC)   Hyperkalemia   Moderate persistent asthma   SOB (shortness of breath)  Acute on chronic diastolic congestive heart failure Acute hypoxic respiratory failure EF 60-65% with grade 2 diastolic dysfunction and moderate to severe LVH Improving after addition of metolazone and initiation of  nocturnal BiPAP Net -6.6 L since admission 12/18: Significant metabolic alkalosis noted on BMP Plan: --BiPAP nightly and as needed with naps during the day --transition to home torsemide today  Contraction alkalosis  Likely due to diuresis Plan: --cont Diamox 250 mg BID for now --transition to home torsemide today  Chronic kidney disease stage IV --transition to home torsemide today  Iron deficiency anemia --Hgb 7-9 Status post 2 doses of IV iron sucrose --transfuse to keep Hgb >7  Hypothyroidism Continue Synthroid  Hypertension --cont home clonidine  --hold home Lisinopril --resume home torsemide  Asthma excerebration  Moderate persistent asthma Dx on pt's med hx, however, pt unaware of this asthma dx. --s/p solumedrol --cont DuoNeb   Acute hypercapnic respiratory failure --ABG with pH 7.31 and pCO2 90 --s/p BiPAP, repeat ABG showed improvement Plan: --BiPAP nightly and PRN   DVT prophylaxis: SCD Code Status: DNR Family Communication: son updated at bedside today disposition Plan:  Status is: Inpatient   Dispo: The patient is from: Home              Anticipated d/c is to: SNF              Anticipated d/c date is: whenever bed available              Patient currently is medically stable to d/c.      Consultants:  Palliative care Procedures:   None  Antimicrobials:   None   Subjective: Pt reported feeling better.  Coughing still, but improved.  Ate ok.  No N/V/D.  Incontinent of urine.   Objective: Vitals:  02/13/20 0439 02/13/20 0808 02/13/20 1159 02/13/20 1613  BP: (!) 165/45 (!) 150/43 (!) 152/40 (!) 117/54  Pulse: 69 64 (!) 57 (!) 58  Resp: 20 17 18 18   Temp: 97.8 F (36.6 C) 98 F (36.7 C) 98 F (36.7 C) (!) 97.4 F (36.3 C)  TempSrc: Oral Oral  Oral  SpO2: 100% 96% 93% 96%  Weight:      Height:        Intake/Output Summary (Last 24 hours) at 02/13/2020 1824 Last data filed at 02/13/2020 1032 Gross per 24 hour  Intake  360 ml  Output 200 ml  Net 160 ml   Filed Weights   02/10/20 0500 02/11/20 0419 02/12/20 0346  Weight: 96 kg 90.9 kg 87.7 kg    Examination:  Constitutional: NAD, alert, oriented to person and place, sitting in chair eating HEENT: conjunctivae and lids normal, EOMI CV: No cyanosis.   RESP: reduced air movement, on 2L [place] Extremities: some edema in BLE SKIN: warm, dry and intact Neuro: II - XII grossly intact.   Psych: Normal mood and affect.     Data Reviewed: I have personally reviewed following labs and imaging studies  CBC: Recent Labs  Lab 02/07/20 0452 02/09/20 0749 02/10/20 1118 02/11/20 0758 02/12/20 0511 02/13/20 0445  WBC 10.7* 4.9 5.0 4.0 4.4 6.4  NEUTROABS 9.3* 3.4 3.6 2.2 3.6  --   HGB 8.5* 7.9* 8.3* 7.7* 9.0* 8.5*  HCT 27.3* 26.3* 28.0* 26.0* 29.6* 28.0*  MCV 99.6 100.8* 100.0 100.8* 99.0 99.6  PLT 131* 123* 135* 128* 160 144   Basic Metabolic Panel: Recent Labs  Lab 02/07/20 0452 02/09/20 0749 02/10/20 1118 02/11/20 0758 02/12/20 0511 02/13/20 0445  NA 143 142 142 143 144 143  K 4.5 4.3 4.0 4.1 4.2 4.6  CL 99 95* 93* 93* 94* 91*  CO2 34* 37* 37* 43* 38* 41*  GLUCOSE 143* 95 235* 96 185* 164*  BUN 62* 66* 66* 64* 62* 73*  CREATININE 1.91* 1.94* 1.94* 1.81* 1.70* 1.82*  CALCIUM 8.2* 8.1* 8.1* 7.9* 8.4* 8.5*  MG 1.9  --   --   --   --  1.8   GFR: Estimated Creatinine Clearance: 21.1 mL/min (A) (by C-G formula based on SCr of 1.82 mg/dL (H)). Liver Function Tests: No results for input(s): AST, ALT, ALKPHOS, BILITOT, PROT, ALBUMIN in the last 168 hours. No results for input(s): LIPASE, AMYLASE in the last 168 hours. No results for input(s): AMMONIA in the last 168 hours. Coagulation Profile: No results for input(s): INR, PROTIME in the last 168 hours. Cardiac Enzymes: No results for input(s): CKTOTAL, CKMB, CKMBINDEX, TROPONINI in the last 168 hours. BNP (last 3 results) No results for input(s): PROBNP in the last 8760 hours. HbA1C: No  results for input(s): HGBA1C in the last 72 hours. CBG: No results for input(s): GLUCAP in the last 168 hours. Lipid Profile: No results for input(s): CHOL, HDL, LDLCALC, TRIG, CHOLHDL, LDLDIRECT in the last 72 hours. Thyroid Function Tests: No results for input(s): TSH, T4TOTAL, FREET4, T3FREE, THYROIDAB in the last 72 hours. Anemia Panel: No results for input(s): VITAMINB12, FOLATE, FERRITIN, TIBC, IRON, RETICCTPCT in the last 72 hours. Sepsis Labs: Recent Labs  Lab 02/09/20 0749  PROCALCITON 0.17    No results found for this or any previous visit (from the past 240 hour(s)).       Radiology Studies: No results found.      Scheduled Meds: . acetaZOLAMIDE  250 mg Oral BID  .  amitriptyline  10 mg Oral QHS  . benzonatate  200 mg Oral TID  . budesonide (PULMICORT) nebulizer solution  0.25 mg Nebulization BID  . Chlorhexidine Gluconate Cloth  6 each Topical Daily  . cloNIDine  0.1 mg Oral BID  . clopidogrel  75 mg Oral Daily  . doxazosin  8 mg Oral Daily  . ipratropium-albuterol  3 mL Nebulization TID  . levothyroxine  100 mcg Oral Q0600  . montelukast  10 mg Oral QHS  . pantoprazole  20 mg Oral Daily  . pregabalin  100 mg Oral QHS  . simvastatin  40 mg Oral QHS  . torsemide  20 mg Oral Daily   Continuous Infusions:    LOS: 11 days     Enzo Bi, MD [facility] Pager [phone]  If 7PM-7AM, please contact night-coverage 02/13/2020, 6:24 PM

## 2020-02-13 NOTE — Care Management Important Message (Signed)
Important Message  Patient Details  Name: Amy [name] MRN: [id number] Date of Birth: 1928/01/18   Medicare Important Message Given:  Yes     Dannette Barbara 02/13/2020, 12:06 PM

## 2020-02-13 NOTE — Progress Notes (Signed)
PT Cancellation Note  Patient Details Name: Amy [name] MRN: [id number] DOB: October 23, 1927   Cancelled Treatment:    Reason Eval/Treat Not Completed: Fatigue/lethargy limiting ability to participate (Chart reviewed, treatment attempted. Pt alseep upon entry. Author allowing pt to rest. Will attempt again next date.)   Surafel Hilleary C 02/13/2020, 3:41 PM

## 2020-02-13 NOTE — TOC Progression Note (Signed)
Transition of Care [facility]) - Progression Note    Patient Details  Name: Amy [name] MRN: [id number] Date of Birth: [birth year]  Transition of Care [facility]) CM/SW [place], RN Phone Number: 02/13/2020, 2:34 PM  Clinical Narrative: Called and spoke with daughter after [facility] declined placement due to not having a bed. Daughter agreed to extending search, which was done. Bedsearch pending offers at this time, will continue to monitor. Phone and texts to SNF's to review referrals.         Expected Discharge Plan and Services                                                 Social Determinants of Health (SDOH) Interventions    Readmission Risk Interventions Readmission Risk Prevention Plan 08/23/2019 08/15/2019  Transportation Screening Complete Complete  Medication Review Press photographer) Complete Complete  PCP or Specialist appointment within 3-5 days of discharge Complete Complete  HRI or Home Care Consult Complete Complete  SW Recovery Care/Counseling Consult Complete Complete  Palliative Care Screening Not Applicable Not [place] Complete Not Applicable  Some recent data might be hidden

## 2020-02-14 ENCOUNTER — Ambulatory Visit: Payer: Medicare Other | Admitting: Family

## 2020-02-14 LAB — MAGNESIUM: Magnesium: 1.8 mg/dL (ref 1.7–2.4)

## 2020-02-14 LAB — CBC
HCT: 25.7 % — ABNORMAL LOW (ref 36.0–46.0)
Hemoglobin: 7.7 g/dL — ABNORMAL LOW (ref 12.0–15.0)
MCH: 30.6 pg (ref 26.0–34.0)
MCHC: 30 g/dL (ref 30.0–36.0)
MCV: 102 fL — ABNORMAL HIGH (ref 80.0–100.0)
Platelets: 155 10*3/uL (ref 150–400)
RBC: 2.52 MIL/uL — ABNORMAL LOW (ref 3.87–5.11)
RDW: 16 % — ABNORMAL HIGH (ref 11.5–15.5)
WBC: 6.6 10*3/uL (ref 4.0–10.5)
nRBC: 0 % (ref 0.0–0.2)

## 2020-02-14 LAB — RESP PANEL BY RT-PCR (FLU A&B, COVID) ARPGX2
Influenza A by PCR: NEGATIVE
Influenza B by PCR: NEGATIVE
SARS Coronavirus 2 by RT PCR: NEGATIVE

## 2020-02-14 LAB — BASIC METABOLIC PANEL
Anion gap: 8 (ref 5–15)
BUN: 80 mg/dL — ABNORMAL HIGH (ref 8–23)
CO2: 42 mmol/L — ABNORMAL HIGH (ref 22–32)
Calcium: 8.5 mg/dL — ABNORMAL LOW (ref 8.9–10.3)
Chloride: 95 mmol/L — ABNORMAL LOW (ref 98–111)
Creatinine, Ser: 1.78 mg/dL — ABNORMAL HIGH (ref 0.44–1.00)
GFR, Estimated: 27 mL/min — ABNORMAL LOW (ref >60)
Glucose, Bld: 102 mg/dL — ABNORMAL HIGH (ref 70–99)
Potassium: 4.1 mmol/L (ref 3.5–5.1)
Sodium: 145 mmol/L (ref 135–145)

## 2020-02-14 MED ORDER — BENZONATATE 200 MG PO CAPS: 200.0000 mg | ORAL_CAPSULE | Freq: Three times a day (TID) | ORAL | 0 refills | Status: AC

## 2020-02-14 MED ORDER — CLONIDINE HCL 0.1 MG PO TABS: 0.1000 mg | ORAL_TABLET | Freq: Two times a day (BID) | ORAL | 11 refills | Status: DC

## 2020-02-14 MED ORDER — ACETAZOLAMIDE 250 MG PO TABS: 250.0000 mg | ORAL_TABLET | Freq: Two times a day (BID) | ORAL | 0 refills | Status: DC

## 2020-02-14 MED ORDER — POLYSACCHARIDE IRON COMPLEX 150 MG PO CAPS
150.0000 mg | ORAL_CAPSULE | Freq: Every day | ORAL | Status: DC
  Administered 2020-02-14: 12:00:00 150 mg via ORAL
  Filled 2020-02-14: qty 1

## 2020-02-14 MED ORDER — CLONIDINE 0.2 MG/24HR TD PTWK: MEDICATED_PATCH | TRANSDERMAL | 12 refills | Status: DC

## 2020-02-14 MED ORDER — POLYSACCHARIDE IRON COMPLEX 150 MG PO CAPS: 150.0000 mg | ORAL_CAPSULE | Freq: Every day | ORAL | Status: DC

## 2020-02-14 MED ORDER — IPRATROPIUM-ALBUTEROL 0.5-2.5 (3) MG/3ML IN SOLN: 3.0000 mL | Freq: Four times a day (QID) | RESPIRATORY_TRACT | Status: DC | PRN

## 2020-02-14 MED ORDER — MELATONIN 5 MG PO TABS: 5.0000 mg | ORAL_TABLET | Freq: Every evening | ORAL | 0 refills | Status: DC | PRN

## 2020-02-14 MED ORDER — PREGABALIN 50 MG PO CAPS: 100.0000 mg | ORAL_CAPSULE | Freq: Every day | ORAL | Status: DC

## 2020-02-14 NOTE — Discharge Summary (Addendum)
Physician Discharge Summary   Krina Mraz Pascale  female DOB: 1927/03/07  OYD:[id number]  PCP: [name], MD  Admit date: 02/02/2020 Discharge date: 02/14/2020  Admitted From: home Disposition:  SNF CODE STATUS: DNR   Hospital Course:  For full details, please see H&P, progress notes, consult notes and ancillary notes.  Briefly,  Amy [name] is a 86 y.o.femalewith medical history significant for chronic diastolic CHF, CKD stage IV, hypothyroidism, right arm shingles in May 6720 complicated by postherpetic neuralgia on Lyrica, iron deficiency anemia with recent blood transfusion at the cancer center in November 2021, moderate persistent asthma, who was brought to the hospital because of increasing shortness of breath and lower extremity edema. She was hypoxic with oxygen saturation of 88% on room air.   Acute on chronic diastolic congestive heart failure Acute hypoxic and hypercapnic respiratory failure She required up to 5 L/min oxygen via nasal cannula.  EF 60-65% with grade 2 diastolic dysfunction and moderate to severe LVH.  Pt was lethargic and found to have ABG with pH 7.31 and pCO2 90 on 02/08/20.  Pt was treated with BiPAP with improvement in her lethargy and mental status.  Repeat ABG showed improvement.  Pt was treated with IV lasix 40-80 mg BID plus metolazone until 12/20 when pt was transitioned back to home torsemide.  Pt's O2 requirement was weaned down to 2L at rest prior to discharge.  Pt was ordered nightly BiPAP, however, pt has been refusing.  Please monitor pt's supplemental O2 needs at the facility, to determine if pt will need supplemental O2 chronically when she eventually goes home.    Contraction alkalosis  Likely due to diuresis.  Pt was started on Diamox 250 mg BID and will continue for 7 days after discharge.  Chronic kidney disease stage IV Cr 2.03 on presentation, 1.78 on the day of discharge, which is around her baseline.  Iron  deficiency anemia Hgb 7-9.  Anemia workup showed only iron def (recent folate and vit B12 wnl).  Pt received 2 doses of IV iron sucrose and discharged on oral iron supplement.  Hypothyroidism Continued Synthroid  Hypertension BP varied widely, from 110's-150's.  Home clonidine patch held and pt was ordered oral clonidine instead.  While pt was receiving IV diuresis, home BP meds were held.  Home Torsemide resumed prior to discharge.  Home HCTZ, Lisinopril resumed after discharge.  Moderate persistent asthma Dx on pt's med hx, however, pt unaware of this asthma dx.  Pt received short pulse of solumedrol, and DuoNeb.  Pt discharged on home bronchodilators.    Discharge Diagnoses:  Principal Problem:   Acute on chronic diastolic heart failure (HCC) Active Problems:   Acquired hypothyroidism   Iron deficiency anemia   CKD (chronic kidney disease) stage 4, GFR 15-29 ml/min (HCC)   Hyperkalemia   Moderate persistent asthma   SOB (shortness of breath)    Discharge Instructions:  Allergies as of 02/14/2020      Reactions   Atenolol Other (See Comments)   Other reaction(s): Other (See Comments) Decreased heart rate Decreased heart rate   Codeine Shortness Of Breath   Rash, difficulty breathing, nausea.   Losartan Other (See Comments)   Hyperkalemia   Morphine And Related Shortness Of Breath   Rash, difficulty breathing, nausea.   Nsaids Other (See Comments)   Other reaction(s): Unknown   Rofecoxib    Other reaction(s): Unknown   Sucralfate Other (See Comments)   Throat tightness   Sulfa Antibiotics  Other reaction(s): Unknown      Medication List    STOP taking these medications   hydrALAZINE 100 MG tablet Commonly known as: APRESOLINE     TAKE these medications   acetaZOLAMIDE 250 MG tablet Commonly known as: DIAMOX Take 1 tablet (250 mg total) by mouth 2 (two) times daily for 7 days.   albuterol 108 (90 Base) MCG/ACT inhaler Commonly known as: VENTOLIN  HFA Inhale 2 puffs into the lungs every 6 (six) hours as needed for wheezing or shortness of breath.   amitriptyline 10 MG tablet Commonly known as: ELAVIL Take 10 mg by mouth at bedtime.   benzonatate 200 MG capsule Commonly known as: TESSALON Take 1 capsule (200 mg total) by mouth 3 (three) times daily for 5 days.   budesonide 0.5 MG/2ML nebulizer solution Commonly known as: PULMICORT Inhale 0.5 mg into the lungs daily.   cloNIDine 0.1 MG tablet Commonly known as: CATAPRES Take 1 tablet (0.1 mg total) by mouth 2 (two) times daily.   cloNIDine 0.2 mg/24hr patch Commonly known as: CATAPRES - Dosed in mg/24 hr Hold until followup with outpatient doctor due to blood pressure sometimes being low.  Was replaced with oral clonidine. What changed:   how much to take  how to take this  when to take this  additional instructions   clopidogrel 75 MG tablet Commonly known as: PLAVIX Take 1 tablet (75 mg total) by mouth daily.   doxazosin 8 MG tablet Commonly known as: CARDURA Take 8 mg by mouth daily.   hydrochlorothiazide 50 MG tablet Commonly known as: HYDRODIURIL Take 50 mg by mouth daily.   ipratropium-albuterol 0.5-2.5 (3) MG/3ML Soln Commonly known as: DUONEB Take 3 mLs by nebulization every 6 (six) hours as needed.   iron polysaccharides 150 MG capsule Commonly known as: NIFEREX Take 1 capsule (150 mg total) by mouth daily. Start taking on: February 15, 2020   levothyroxine 88 MCG tablet Commonly known as: SYNTHROID 88 mcg daily before breakfast.   lisinopril 2.5 MG tablet Commonly known as: ZESTRIL Take 2.5 mg by mouth daily.   melatonin 5 MG Tabs Take 1 tablet (5 mg total) by mouth at bedtime as needed (sleep).   montelukast 10 MG tablet Commonly known as: SINGULAIR Take 10 mg by mouth at bedtime.   nitroGLYCERIN 0.4 MG SL tablet Commonly known as: NITROSTAT Place 1 tablet (0.4 mg total) under the tongue every 5 (five) minutes x 3 doses as needed  for chest pain.   pantoprazole 40 MG tablet Commonly known as: PROTONIX Take 20 mg by mouth daily.   pregabalin 50 MG capsule Commonly known as: Lyrica Take 2 capsules (100 mg total) by mouth at bedtime. What changed:   how much to take  how to take this  when to take this  additional instructions   simvastatin 40 MG tablet Commonly known as: ZOCOR Take 1 tablet (40 mg total) by mouth at bedtime.   Symbicort 160-4.5 MCG/ACT inhaler Generic drug: budesonide-formoterol Inhale 2 puffs into the lungs 2 (two) times daily.   torsemide 20 MG tablet Commonly known as: DEMADEX Take 20 mg by mouth daily.        Follow-up Information    [facility] Follow up on 03/06/2020.   Specialty: Cardiology Why: at 1:00pm. Enter through the [place] entrance Contact information: [place] [phone]       [name], MD. Schedule an  appointment as soon as possible for a visit in 1 week(s).   Specialty: Internal Medicine Contact information: [place] [address] [phone]        Minna Merritts, MD .   Specialty: Cardiology Contact information: [place] [address] [phone]        Murlean Iba, MD. Schedule an appointment as soon as possible for a visit in 2 week(s).   Specialty: Nephrology Contact information: [place] [address] [phone]               Allergies  Allergen Reactions  . Atenolol Other (See Comments)    Other reaction(s): Other (See Comments) Decreased heart rate Decreased heart rate  . Codeine Shortness Of Breath    Rash, difficulty breathing, nausea.  . Losartan Other (See Comments)    Hyperkalemia  . Morphine And Related Shortness Of Breath    Rash, difficulty breathing, nausea.   . Nsaids Other (See Comments)    Other  reaction(s): Unknown  . Rofecoxib     Other reaction(s): Unknown  . Sucralfate Other (See Comments)    Throat tightness  . Sulfa Antibiotics     Other reaction(s): Unknown     The results of significant diagnostics from this hospitalization (including imaging, microbiology, ancillary and laboratory) are listed below for reference.   Consultations:   Procedures/Studies: DG Chest 2 View  Result Date: 02/02/2020 CLINICAL DATA:  Short of breath, heart failure EXAM: CHEST - 2 VIEW COMPARISON:  08/18/2019 FINDINGS: Cardiac enlargement. Bibasilar atelectasis and small pleural effusions. Upper lobes clear. Atherosclerotic aortic arch. IMPRESSION: Bibasilar atelectasis and small pleural effusions bilaterally. Electronically Signed   By: Franchot Gallo M.D.   On: 02/02/2020 12:04   DG Chest Port 1 View  Result Date: 02/08/2020 CLINICAL DATA:  Shortness of breath EXAM: PORTABLE CHEST 1 VIEW COMPARISON:  02/07/2020 FINDINGS: Persistent bilateral pleural effusions and adjacent atelectasis. Probable mild pulmonary edema appears similar. No pneumothorax. Stable cardiomediastinal contours. IMPRESSION: Similar appearance of bilateral pleural effusions and adjacent atelectasis and probable mild pulmonary edema. Electronically Signed   By: Macy Mis M.D.   On: 02/08/2020 13:48   DG Chest Port 1 View  Result Date: 02/07/2020 CLINICAL DATA:  Shortness of breath EXAM: PORTABLE CHEST 1 VIEW COMPARISON:  February 02, 2020 FINDINGS: There is cardiomegaly with pulmonary vascular congestion. There are pleural effusions bilaterally. There is mild interstitial edema. There is ill-defined opacity in the lung bases, likely alveolar edema. There is aortic atherosclerosis. No adenopathy. No bone lesions IMPRESSION: Findings felt to be indicative of congestive heart failure. Airspace opacity in the lung bases is likely due to edema, although superimposed pneumonia in the lung bases cannot be excluded. Aortic  Atherosclerosis (ICD10-I70.0). Electronically Signed   By: Lowella Grip III M.D.   On: 02/07/2020 11:30   MM 3D SCREEN BREAST BILATERAL  Result Date: 01/25/2020 CLINICAL DATA:  Screening. EXAM: DIGITAL SCREENING BILATERAL MAMMOGRAM WITH TOMO AND CAD COMPARISON:  Previous exam(s). ACR Breast Density Category b: There are scattered areas of fibroglandular density. FINDINGS: There are no findings suspicious for malignancy. Images were processed with CAD. IMPRESSION: No mammographic evidence of malignancy. A result letter of this screening mammogram will be mailed directly to the patient. RECOMMENDATION: Screening mammogram in one year. (Code:[id number]-01Y) BI-RADS CATEGORY  1: Negative. Electronically Signed   By: Lillia Mountain M.D.   On: 01/25/2020 16:35   ECHOCARDIOGRAM COMPLETE  Result Date: 02/08/2020  ECHOCARDIOGRAM REPORT   Patient Name:   ANANI GU Date of Exam: 02/07/2020 Medical Rec #:  [id number]          Height: Accession #:    [id number]         Weight: Date of Birth:  03/29/1927          BSA: Patient Age:    63 years           BP:           162/43 mmHg Patient Gender: F                  HR:           70 bpm. Exam Location:  [facility] Procedure: 2D Echo, Cardiac Doppler and Color Doppler Indications:     B58.30 Acute Diastolic Heart Failure  History:         Patient has prior history of Echocardiogram examinations, most                  recent 08/14/2019. Risk Factors:Hypertension and Diabetes.                  Thyroid disease. Renal Insufficiency. Chronic kidney disease.                  Dyspnea. Coronary artery disease.  Sonographer:     Wilford Sports Rodgers-Jones Referring Phys:  [id number] Jennye Boroughs Diagnosing Phys: Nelva Bush MD IMPRESSIONS  1. Left ventricular ejection fraction, by estimation, is 55 to 60%. The left ventricle has normal function. The left ventricle has no regional wall motion abnormalities. There is mild left ventricular hypertrophy. Left ventricular diastolic parameters  are consistent with Grade II diastolic dysfunction (pseudonormalization). Elevated left atrial pressure.  2. Right ventricular systolic function is normal. The right ventricular size is mildly enlarged. There is mildly elevated pulmonary artery systolic pressure.  3. Left atrial size was mildly dilated.  4. Right atrial size was mildly dilated.  5. The mitral valve is abnormal. Trivial mitral valve regurgitation. Mild to moderate mitral stenosis. Severe mitral annular calcification.  6. The aortic valve is tricuspid. There is mild calcification of the aortic valve. There is mild thickening of the aortic valve. Aortic valve regurgitation is trivial. Mild aortic valve stenosis. Aortic valve mean gradient measures 12.0 mmHg.  7. The inferior vena cava is normal in size with <50% respiratory variability, suggesting right atrial pressure of 8 mmHg. FINDINGS  Left Ventricle: Left ventricular ejection fraction, by estimation, is 55 to 60%. The left ventricle has normal function. The left ventricle has no regional wall motion abnormalities. The left ventricular internal cavity size was normal in size. There is  mild left ventricular hypertrophy. Left ventricular diastolic parameters are consistent with Grade II diastolic dysfunction (pseudonormalization). Elevated left atrial pressure. Right Ventricle: The right ventricular size is mildly enlarged. No increase in right ventricular wall thickness. Right ventricular systolic function is normal. There is mildly elevated pulmonary artery systolic pressure. The tricuspid regurgitant velocity is 2.92 m/s, and with an assumed right atrial pressure of 8 mmHg, the estimated right ventricular systolic pressure is 08.8 mmHg. Left Atrium: Left atrial size was mildly dilated. Right Atrium: Right atrial size was mildly dilated. Pericardium: There is no evidence of pericardial effusion. Mitral Valve: The mitral valve is abnormal. There is moderate thickening of the mitral valve  leaflet(s). There is severe calcification of the mitral valve leaflet(s). Severe mitral annular calcification. Trivial mitral valve regurgitation. Mild to moderate mitral  valve stenosis. Tricuspid Valve: The tricuspid valve is normal in structure. Tricuspid valve regurgitation is mild. Aortic Valve: The aortic valve is tricuspid. There is mild calcification of the aortic valve. There is mild thickening of the aortic valve. Aortic valve regurgitation is trivial. Mild aortic stenosis is present. Aortic valve mean gradient measures 12.0 mmHg. Aortic valve peak gradient measures 19.7 mmHg. Pulmonic Valve: The pulmonic valve was normal in structure. Pulmonic valve regurgitation is mild. No evidence of pulmonic stenosis. Aorta: The aortic root and ascending aorta are structurally normal, with no evidence of dilitation. Pulmonary Artery: The pulmonary artery is not well seen. Venous: The inferior vena cava is normal in size with less than 50% respiratory variability, suggesting right atrial pressure of 8 mmHg. IAS/Shunts: The interatrial septum was not well visualized.  AORTIC VALVE AV Vmax:      222.00 cm/s AV Peak Grad: 19.7 mmHg AV Mean Grad: 12.0 mmHg TRICUSPID VALVE TR Peak grad:   34.1 mmHg TR Vmax:        292.00 cm/s Nelva Bush MD Electronically signed by Nelva Bush MD Signature Date/Time: 02/08/2020/7:11:06 AM    Final       Labs: BNP (last 3 results) Recent Labs    08/12/19 1300 02/02/20 1221  BNP 58.8 700.1*   Basic Metabolic Panel: Recent Labs  Lab 02/10/20 1118 02/11/20 0758 02/12/20 0511 02/13/20 0445 02/14/20 0433  NA 142 143 144 143 145  K 4.0 4.1 4.2 4.6 4.1  CL 93* 93* 94* 91* 95*  CO2 37* 43* 38* 41* 42*  GLUCOSE 235* 96 185* 164* 102*  BUN 66* 64* 62* 73* 80*  CREATININE 1.94* 1.81* 1.70* 1.82* 1.78*  CALCIUM 8.1* 7.9* 8.4* 8.5* 8.5*  MG  --   --   --  1.8 1.8   Liver Function Tests: No results for input(s): AST, ALT, ALKPHOS, BILITOT, PROT, ALBUMIN in the last  168 hours. No results for input(s): LIPASE, AMYLASE in the last 168 hours. No results for input(s): AMMONIA in the last 168 hours. CBC: Recent Labs  Lab 02/09/20 0749 02/10/20 1118 02/11/20 0758 02/12/20 0511 02/13/20 0445 02/14/20 0433  WBC 4.9 5.0 4.0 4.4 6.4 6.6  NEUTROABS 3.4 3.6 2.2 3.6  --   --   HGB 7.9* 8.3* 7.7* 9.0* 8.5* 7.7*  HCT 26.3* 28.0* 26.0* 29.6* 28.0* 25.7*  MCV 100.8* 100.0 100.8* 99.0 99.6 102.0*  PLT 123* 135* 128* 160 162 155   Cardiac Enzymes: No results for input(s): CKTOTAL, CKMB, CKMBINDEX, TROPONINI in the last 168 hours. BNP: Invalid input(s): POCBNP CBG: No results for input(s): GLUCAP in the last 168 hours. D-Dimer No results for input(s): DDIMER in the last 72 hours. Hgb A1c No results for input(s): HGBA1C in the last 72 hours. Lipid Profile No results for input(s): CHOL, HDL, LDLCALC, TRIG, CHOLHDL, LDLDIRECT in the last 72 hours. Thyroid function studies No results for input(s): TSH, T4TOTAL, T3FREE, THYROIDAB in the last 72 hours.  Invalid input(s): FREET3 Anemia work up No results for input(s): VITAMINB12, FOLATE, FERRITIN, TIBC, IRON, RETICCTPCT in the last 72 hours. Urinalysis    Component Value Date/Time   COLORURINE YELLOW (A) 08/18/2019 0745   APPEARANCEUR CLEAR (A) 08/18/2019 0745   APPEARANCEUR Cloudy (A) 03/15/2019 0909   LABSPEC 1.014 08/18/2019 0745   LABSPEC 1.025 10/11/2013 1637   PHURINE 5.0 08/18/2019 0745   GLUCOSEU NEGATIVE 08/18/2019 0745   GLUCOSEU Negative 10/11/2013 [place] 08/18/2019 0745   BILIRUBINUR NEGATIVE 08/18/2019 0745  BILIRUBINUR Negative 03/15/2019 0909   BILIRUBINUR Negative 10/11/2013 [place] 08/18/2019 0745   PROTEINUR NEGATIVE 08/18/2019 0745   NITRITE NEGATIVE 08/18/2019 0745   LEUKOCYTESUR NEGATIVE 08/18/2019 0745   LEUKOCYTESUR Negative 10/11/2013 1637   Sepsis Labs Invalid input(s): PROCALCITONIN,  WBC,  LACTICIDVEN Microbiology Recent Results (from  the past 240 hour(s))  Resp Panel by RT-PCR (Flu A&B, Covid) Nasopharyngeal Swab     Status: None   Collection Time: 02/14/20 10:56 AM   Specimen: Nasopharyngeal Swab; Nasopharyngeal(NP) swabs in vial transport medium  Result Value Ref Range Status   SARS Coronavirus 2 by RT PCR NEGATIVE NEGATIVE Final    Comment: (NOTE) SARS-CoV-2 target nucleic acids are NOT DETECTED.  The SARS-CoV-2 RNA is generally detectable in upper respiratory specimens during the acute phase of infection. The lowest concentration of SARS-CoV-2 viral copies this assay can detect is 138 copies/mL. A negative result does not preclude SARS-Cov-2 infection and should not be used as the sole basis for treatment or other patient management decisions. A negative result may occur with  improper specimen collection/handling, submission of specimen other than nasopharyngeal swab, presence of viral mutation(s) within the areas targeted by this assay, and inadequate number of viral copies(<138 copies/mL). A negative result must be combined with clinical observations, patient history, and epidemiological information. The expected result is Negative.  Fact Sheet for Patients:  EntrepreneurPulse.com.au  Fact Sheet for Healthcare Providers:  IncredibleEmployment.be  This test is no t yet approved or cleared by the [place] FDA and  has been authorized for detection and/or diagnosis of SARS-CoV-2 by FDA under an Emergency Use Authorization (EUA). This EUA will remain  in effect (meaning this test can be used) for the duration of the COVID-19 declaration under Section 564(b)(1) of the Act, 21 U.S.C.section 360bbb-3(b)(1), unless the authorization is terminated  or revoked sooner.       Influenza A by PCR NEGATIVE NEGATIVE Final   Influenza B by PCR NEGATIVE NEGATIVE Final    Comment: (NOTE) The Xpert Xpress SARS-CoV-2/FLU/RSV plus assay is intended as an aid in the diagnosis of  influenza from Nasopharyngeal swab specimens and should not be used as a sole basis for treatment. Nasal washings and aspirates are unacceptable for Xpert Xpress SARS-CoV-2/FLU/RSV testing.  Fact Sheet for Patients: EntrepreneurPulse.com.au  Fact Sheet for Healthcare Providers: IncredibleEmployment.be  This test is not yet approved or cleared by the [place] FDA and has been authorized for detection and/or diagnosis of SARS-CoV-2 by FDA under an Emergency Use Authorization (EUA). This EUA will remain in effect (meaning this test can be used) for the duration of the COVID-19 declaration under Section 564(b)(1) of the Act, 21 U.S.C. section 360bbb-3(b)(1), unless the authorization is terminated or revoked.  Performed at [facility], [address] [place], [place] [address]      Total time spend on discharging this patient, including the last patient exam, discussing the hospital stay, instructions for ongoing care as it relates to all pertinent caregivers, as well as preparing the medical discharge records, prescriptions, and/or referrals as applicable, is 40 minutes.    Enzo Bi, MD  [facility] 02/14/2020, 1:55 PM

## 2020-02-14 NOTE — Progress Notes (Signed)
Physical Therapy Treatment Patient Details Name: Amy [name] MRN: [id number] DOB: 07-04-1927 Today's Date: 02/14/2020    History of Present Illness Amy [name] is a 34yoF who comes to [facility] on 12/9 c SOB. The patient reports 2 to 3 days of progressive shortness of breath associated with orthopnea and new onset edema in the bilateral lower extremities. PMH: CAD, Cancer, dCHF c LVEF 60-65%, hypoTSH, stg-IV CKD, anemia of chronic disease, asthma, GERD, bilat knee OA. Per TOC note: Patient lives alone in a small home built on the Niece's property, ambulates with cane or walker, needs a wheelchair to use when going for doctors visits. Patient was receiving Palliative care visit monthly,  Daughter also requests Hospital Bed, toilet seat riser, and wheelchair.    PT Comments    Pt in chair upon entry, asleep, slumped over table. Pt awakens to voice, attempts to participate in leg exercises, but has difficulty attending to task, mostly due to somnolence with some elements of fatigue as well. Legs remain very weak this date, pt requiring assist for full ROM. Pt unable to remain awake, eventually session ended early. Pt reclined to allow for safer resting position.    Follow Up Recommendations  SNF;Supervision for mobility/OOB     Equipment Recommendations  3in1 (PT)    Recommendations for Other Services       Precautions / Restrictions Precautions Precautions: Fall Restrictions Weight Bearing Restrictions: No    Mobility  Bed Mobility               General bed mobility comments: in recliner upon entry; requires Max physical assist for scoot back into chair  Transfers Overall transfer level:  (deferred due to domnolence)                  Ambulation/Gait                 Stairs             Wheelchair Mobility    Modified Rankin (Stroke Patients Only)       Balance                                            Cognition  Arousal/Alertness: Lethargic   Overall Cognitive Status: Impaired/Different from baseline                                        Exercises General Exercises - Lower Extremity Ankle Circles/Pumps: AAROM;Both;20 reps;Seated Long Arc Quad: AAROM;Both;20 reps;Seated;Limitations Long [employer] Limitations: 2x10 bilat, very weak, needs asssit for full range Hip Flexion/Marching: AAROM;Both;20 reps;Limitations Hip Flexion/Marching Limitations: 2x10    General Comments        Pertinent Vitals/Pain Pain Assessment: No/denies pain    Home Living                      Prior Function            PT Goals (current goals can now be found in the care plan section) Acute Rehab PT Goals Patient Stated Goal: To return to home PT Goal Formulation: With patient Time For Goal Achievement: 02/23/20 Potential to Achieve Goals: Fair Progress towards PT goals: Not progressing toward goals - comment    Frequency    Min 2X/week  PT Plan Current plan remains appropriate    Co-evaluation              AM-PAC PT "6 Clicks" Mobility   Outcome Measure  Help needed turning from your back to your side while in a flat bed without using bedrails?: Total Help needed moving from lying on your back to sitting on the side of a flat bed without using bedrails?: Total Help needed moving to and from a bed to a chair (including a wheelchair)?: Total Help needed standing up from a chair using your arms (e.g., wheelchair or bedside chair)?: Total Help needed to walk in hospital room?: Total Help needed climbing 3-5 steps with a railing? : Total 6 Click Score: 6    End of Session Equipment Utilized During Treatment: Oxygen Activity Tolerance: Patient limited by fatigue Patient left: with call bell/phone within reach;in chair;with chair alarm set Nurse Communication: Mobility status PT Visit Diagnosis: Difficulty in walking, not elsewhere classified (R26.2);Muscle  weakness (generalized) (M62.81);Other abnormalities of gait and mobility (R26.89)     Time: 1638-4665 PT Time Calculation (min) (ACUTE ONLY): 11 min  Charges:  $Therapeutic Exercise: 8-22 mins                     1:44 PM, 02/14/20 Etta Grandchild, PT, DPT Physical Therapist - [facility]  [phone] ([place])    Jarmar Rousseau C 02/14/2020, 1:42 PM

## 2020-02-14 NOTE — TOC Transition Note (Signed)
Transition of Care [facility]) - CM/SW Discharge Note   Patient Details  Name: Amy [name] MRN: [id number] Date of Birth: Jul 02, 1927  Transition of Care [facility]) CM/SW Contact:  Kerin Salen, RN Phone Number: 02/14/2020, 12:38 PM   Clinical Narrative:  Patient to be discharged to SNF, by [facility]. No further TOC needs at this time.    Final next level of care: Skilled Nursing Facility Barriers to Discharge: Barriers Resolved   Patient Goals and CMS Choice Patient states their goals for this hospitalization and ongoing recovery are:: SNF   Choice offered to / list presented to : Adult Children (Daughter, Rissie Sculley)  Discharge Placement              Patient chooses bed at:  ([place] R[facility]-14) Patient to be transferred to facility by: [facility] Beverely Low [phone] Name of family member notified: Merton Border Patient and family notified of of transfer: 02/14/20  Discharge Plan and Services                DME Arranged: N/A DME Agency: NA       HH Arranged: NA [place] Agency: NA        Social Determinants of Health (SDOH) Interventions     Readmission Risk Interventions Readmission Risk Prevention Plan 08/23/2019 08/15/2019  Transportation Screening Complete Complete  Medication Review Press photographer) Complete Complete  PCP or Specialist appointment within 3-5 days of discharge Complete Complete  HRI or Home Care Consult Complete Complete  SW Recovery Care/Counseling Consult Complete Complete  Palliative Care Screening Not Applicable Not [place] Complete Not Applicable  Some recent data might be hidden

## 2020-02-14 NOTE — Progress Notes (Signed)
Patient refused bipap mask for the night. Patient remains on 3LPM oxygen via nasal cannula. Nurse made aware of patient's refusal.

## 2020-02-14 NOTE — Progress Notes (Signed)
PCR Respiratory completed on patient in preparation for patient being discharged. Negative results in Results Review,

## 2020-02-21 NOTE — Addendum Note (Signed)
Encounter addended by: Kathyrn Drown, RN on: 02/21/2020 2:33 PM  Actions taken: Charge Capture section accepted

## 2020-02-28 ENCOUNTER — Non-Acute Institutional Stay: Payer: Medicare Other | Admitting: Primary Care

## 2020-02-28 ENCOUNTER — Other Ambulatory Visit: Payer: Self-pay

## 2020-02-28 DIAGNOSIS — G894 Chronic pain syndrome: Secondary | ICD-10-CM

## 2020-02-28 DIAGNOSIS — B0229 Other postherpetic nervous system involvement: Secondary | ICD-10-CM

## 2020-02-28 DIAGNOSIS — Z515 Encounter for palliative care: Secondary | ICD-10-CM

## 2020-02-28 DIAGNOSIS — R0602 Shortness of breath: Secondary | ICD-10-CM

## 2020-02-28 NOTE — Progress Notes (Signed)
[facility] Consult Note Telephone: [phone]  Fax: [phone]     Date of encounter: 02/28/20 PATIENT NAME: Amy [name] [address] [address] [phone] (home)  DOB: October 25, 1927 MRN: [id number]  PRIMARY CARE PROVIDER:    [name], MD,  [place] [address] [phone]  REFERRING PROVIDER:   Dr Amy Courts, MD  RESPONSIBLE PARTY:   Extended Emergency Contact Information Primary Emergency Contact: Amy [name] Address: [place] [address] [place] Phone: [phone] Relation: Amy [name] Secondary Emergency Contact: Amy [name] Mobile Phone: [phone] Relation: Son  I met face to face with patient in the facility. Palliative Care was asked to follow this patient by consultation request of Dr Amy [name] to help address advance care planning and goals of care. This is the initial  visit.    ASSESSMENT AND RECOMMENDATIONS:   1. Advance Care Planning/Goals of Care: Goals include to maximize quality of life and symptom management. Our advance care planning conversation included a discussion about:      Exploration of goals of care in the event of a sudden injury or illness   Identification  of a healthcare agent - Amy [name]  Has DNR and MOST on record.MOST with limited scope, use of abx,  use of IV  and limited use of feeding tube. DNR left in facility which needed a hard copy. T/c to Amy [name] for discussion RE GOC.  2. Symptom Management:   I met with Amy [name] for the first time in her nursing room. She is here after a exacerbation of CHF and decline. She's been living alone with assistance from family members. I spoke with her Amy [name] by phone for history.   CHF: Amy [name] states that she had not been weighing on a regular daily basis; in fact the record implies that she lost some 30 pounds of  fluid weight in the last two weeks as her initial weight at the nursing home was 185 and is currently 155. I will continue to monitor this finding.   Pain: Today patient states that her biggest issue is her post herpetic neuralgia. This has been ongoing for six months since a episode of zoster. She did have the Shingrex but infection broke through with this episode. Amy [name] states she had been on various combinations of Lyrica,  first 100 mg at night but was having pain in the morning and so they divided the dose.  She progressed however in having pain so I would recommend an increase to 50 mg in the morning and 100 mg and the HS. She is known to pain management so I would defer any changes to them. I will include them in this consultation  report.   Disposition I discuss with Amy [name] what she thought her plan would be. She stated she would need to find out about the finances and was going to meet with the nursing home tomorrow. The hope had been that she can return to her baseline to independent living; however this is proving to be a slow process complicated by the postherpetic neuralgia.   3. Follow up Palliative Care Visit: Palliative care will continue to follow for goals of care clarification and symptom management. Return 2-4 weeks or prn.  4. Family /Caregiver/Community Supports:  Amy [name] is POA, currently in SNF for rehab.  5. Cognitive / Functional decline: A and O x 2-3, endorses pain  left shoulder and torso. Declined due to pain and debility in context of CHF exacerbation  I spent 45 minutes providing this consultation,  from 1300 to 1345. More than 50% of the time in this consultation was spent coordinating communication.   CODE STATUS: DNR PPS: 40%  HOSPICE ELIGIBILITY/DIAGNOSIS: TBD  Subjective:  CHIEF COMPLAINT: PHN  HISTORY OF PRESENT ILLNESS:  Amy [name] is a 87 y.o. year old female  with medical history significant for chronic diastolic CHF, CKD stage IV,  hypothyroidism, right arm shingles in May 0881 complicated by postherpetic neuralgia on Lyrica, iron deficiency anemia with recent blood transfusion at the cancer center in November 2021. She went to ED with hypoxia on  12/9 in fluid overload. She was stablized and sent to SNF for rehab. Amy [name] endorses her refusal to use BIPAP. She was also uncompliant with daily weights at home, where she lived alone. She is at SNF now for rehab but it is complicated by PHN x 6 months, debility and deconditioning.  We are asked to consult around advance care planning and complex medical decision making.    Review and summarization of old 105 records shows or history from other than patient  shows recent hospital course and state of chronic disease management.. Review or lab tests, radiology,  or medicine= Creatinine reviewed. Review of case with family member =Amy [name] Amy [name]  History obtained from review of EMR, discussion with primary team, and  interview with family, caregiver  and/or Amy [name]. Records reviewed and summarized above.   CURRENT PROBLEM LIST:  Patient Active Problem List   Diagnosis Date Noted  . Acute on chronic diastolic heart failure ([place]) 02/02/2020  . Moderate persistent asthma 02/02/2020  . SOB (shortness of breath) 02/02/2020  . Anemia associated with chronic renal failure 10/22/2019  . Postherpetic neuralgia 10/18/2019  . Neuropathic pain of chest 10/18/2019  . Chronic pain syndrome 10/18/2019  . NSTEMI (non-ST elevated myocardial infarction) ([place]) 08/18/2019  . Syncope and collapse 08/13/2019  . Unresponsiveness 08/12/2019  . Hyperkalemia 08/12/2019  . HTN (hypertension) 08/12/2019  . GERD (gastroesophageal reflux disease) 08/12/2019  . Hypothyroidism 08/12/2019  . CAD (coronary artery disease) 08/12/2019  . Shingles 08/12/2019  . Carotid arterial disease ([place])   . Hypotension   . Groin pain, right   . Chest pain 08/06/2019  . Renovascular hypertension  03/08/2019  . Renal artery stenosis ([place]) 01/28/2019  . Multiple renal cysts 12/29/2018  . Renal stone 12/29/2018  . CKD (chronic kidney disease) stage 4, GFR 15-29 ml/min (HCC) 11/23/2018  . Diabetes ([place]) 06/29/2017  . CAP (community acquired pneumonia) 05/16/2017  . GI bleed 09/21/2016  . Vitamin D deficiency 11/04/2015  . Multiple lung nodules   . COPD with chronic bronchitis ([place])   . New onset atrial fibrillation ([place]) 10/04/2015  . Anemia 10/04/2015  . Acute kidney injury superimposed on CKD ([place]) 10/03/2015  . Palliative care encounter 06/05/2015  . Morbid obesity ([place]) 12/05/2014  . Angina pectoris associated with type 2 diabetes mellitus ([place]) 12/05/2014  . Type 2 diabetes mellitus with hyperglycemia ([place]) 08/22/2014  . Chronic diastolic CHF (congestive heart failure) ([place]) 09/06/2013  . Preop cardiovascular exam 06/03/2012  . Iron deficiency anemia 09/08/2011  . HLD (hyperlipidemia) 07/18/2009  . Coronary artery disease involving native coronary artery of native heart without angina pectoris 07/18/2009  . Acquired hypothyroidism 07/12/2009  . Essential hypertension 07/12/2009   PAST MEDICAL HISTORY:  Active Ambulatory Problems    Diagnosis Date Noted  . Acquired  hypothyroidism 07/12/2009  . HLD (hyperlipidemia) 07/18/2009  . Essential hypertension 07/12/2009  . Coronary artery disease involving native coronary artery of native heart without angina pectoris 07/18/2009  . Iron deficiency anemia 09/08/2011  . Preop cardiovascular exam 06/03/2012  . Chronic diastolic CHF (congestive heart failure) ([place]) 09/06/2013  . Morbid obesity ([place]) 12/05/2014  . Angina pectoris associated with type 2 diabetes mellitus ([place]) 12/05/2014  . Palliative care encounter 06/05/2015  . Acute kidney injury superimposed on CKD ([place]) 10/03/2015  . New onset atrial fibrillation ([place]) 10/04/2015  . Anemia 10/04/2015  . COPD with chronic bronchitis ([place])   . Multiple lung nodules   . Vitamin  D deficiency 11/04/2015  . GI bleed 09/21/2016  . CAP (community acquired pneumonia) 05/16/2017  . Diabetes ([place]) 06/29/2017  . CKD (chronic kidney disease) stage 4, GFR 15-29 ml/min (HCC) 11/23/2018  . Multiple renal cysts 12/29/2018  . Renal stone 12/29/2018  . Type 2 diabetes mellitus with hyperglycemia ([place]) 08/22/2014  . Renal artery stenosis ([place]) 01/28/2019  . Renovascular hypertension 03/08/2019  . Chest pain 08/06/2019  . Unresponsiveness 08/12/2019  . Hyperkalemia 08/12/2019  . HTN (hypertension) 08/12/2019  . GERD (gastroesophageal reflux disease) 08/12/2019  . Hypothyroidism 08/12/2019  . CAD (coronary artery disease) 08/12/2019  . Carotid arterial disease ([place])   . Hypotension   . Shingles 08/12/2019  . Groin pain, right   . Syncope and collapse 08/13/2019  . NSTEMI (non-ST elevated myocardial infarction) ([place]) 08/18/2019  . Postherpetic neuralgia 10/18/2019  . Neuropathic pain of chest 10/18/2019  . Chronic pain syndrome 10/18/2019  . Anemia associated with chronic renal failure 10/22/2019  . Acute on chronic diastolic heart failure ([place]) 02/02/2020  . Moderate persistent asthma 02/02/2020  . SOB (shortness of breath) 02/02/2020   Resolved Ambulatory Problems    Diagnosis Date Noted  . DYSPNEA 01/21/2010  . Diabetes type 2, uncontrolled ([place]) 12/05/2014  . Acute on chronic diastolic (congestive) heart failure ([place]) 10/01/2015  . COPD exacerbation ([place]) 10/04/2015  . Acute bronchitis   . Sepsis ([place]) 05/16/2017   Past Medical History:  Diagnosis Date  . Bladder incontinence   . Cancer ([name])   . Chronic diastolic (congestive) heart failure ([place])   . Chronic Dyspnea on exertion   . CKD (chronic kidney disease) stage 3, GFR 30-59 ml/min (HCC)   . Degenerative arthritis of knee   . Diabetes mellitus   . GIB (gastrointestinal bleeding)   . Hiatal hernia   . Hypertension   . Iron deficiency   . Menopausal symptoms   . Renal insufficiency   . Thyroid  disease    SOCIAL HX:  Social History   Tobacco Use  . Smoking status: Never Smoker  . Smokeless tobacco: Never Used  Substance Use Topics  . Alcohol use: No   FAMILY HX:  Family History  Problem Relation Age of Onset  . Breast cancer Mother 71      ALLERGIES:  Allergies  Allergen Reactions  . Atenolol Other (See Comments)    Other reaction(s): Other (See Comments) Decreased heart rate Decreased heart rate  . Codeine Shortness Of Breath    Rash, difficulty breathing, nausea.  . Losartan Other (See Comments)    Hyperkalemia  . Morphine And Related Shortness Of Breath    Rash, difficulty breathing, nausea.   . Nsaids Other (See Comments)    Other reaction(s): Unknown  . Rofecoxib     Other reaction(s): Unknown  . Sucralfate Other (See Comments)    Throat  tightness  . Sulfa Antibiotics     Other reaction(s): Unknown     PERTINENT MEDICATIONS:  Outpatient Encounter Medications as of 02/28/2020  Medication Sig  . albuterol (PROVENTIL HFA;VENTOLIN HFA) 108 (90 Base) MCG/ACT inhaler Inhale 2 puffs into the lungs every 6 (six) hours as needed for wheezing or shortness of breath.  Marland Kitchen amitriptyline (ELAVIL) 10 MG tablet Take 10 mg by mouth at bedtime.  . budesonide (PULMICORT) 0.5 MG/2ML nebulizer solution Inhale 0.5 mg into the lungs daily.  . cloNIDine (CATAPRES) 0.1 MG tablet Take 1 tablet (0.1 mg total) by mouth 2 (two) times daily.  . clopidogrel (PLAVIX) 75 MG tablet Take 1 tablet (75 mg total) by mouth daily.  Marland Kitchen doxazosin (CARDURA) 8 MG tablet Take 8 mg by mouth daily.  . hydrochlorothiazide (HYDRODIURIL) 50 MG tablet Take 50 mg by mouth daily.  Marland Kitchen ipratropium-albuterol (DUONEB) 0.5-2.5 (3) MG/3ML SOLN Take 3 mLs by nebulization every 6 (six) hours as needed.  . iron polysaccharides (NIFEREX) 150 MG capsule Take 1 capsule (150 mg total) by mouth daily.  Marland Kitchen levothyroxine (SYNTHROID) 88 MCG tablet 88 mcg daily before breakfast.   . lisinopril (ZESTRIL) 2.5 MG tablet Take  2.5 mg by mouth daily.  . melatonin 5 MG TABS Take 1 tablet (5 mg total) by mouth at bedtime as needed (sleep).  . montelukast (SINGULAIR) 10 MG tablet Take 10 mg by mouth at bedtime.   . pregabalin (LYRICA) 50 MG capsule Take 2 capsules (100 mg total) by mouth at bedtime. (Patient taking differently: Take 50 mg by mouth 2 (two) times daily.)  . simvastatin (ZOCOR) 40 MG tablet Take 1 tablet (40 mg total) by mouth at bedtime.  . SYMBICORT 160-4.5 MCG/ACT inhaler Inhale 2 puffs into the lungs 2 (two) times daily.   Marland Kitchen torsemide (DEMADEX) 20 MG tablet Take 20 mg by mouth daily.  Marland Kitchen acetaZOLAMIDE (DIAMOX) 250 MG tablet Take 1 tablet (250 mg total) by mouth 2 (two) times daily for 7 days.  . cloNIDine (CATAPRES - DOSED IN MG/24 HR) 0.2 mg/24hr patch Hold until followup with outpatient doctor due to blood pressure sometimes being low.  Was replaced with oral clonidine. (Patient not taking: Reported on 02/28/2020)  . nitroGLYCERIN (NITROSTAT) 0.4 MG SL tablet Place 1 tablet (0.4 mg total) under the tongue every 5 (five) minutes x 3 doses as needed for chest pain.  . pantoprazole (PROTONIX) 40 MG tablet Take 20 mg by mouth daily.    Facility-Administered Encounter Medications as of 02/28/2020  Medication  . 0.9 %  sodium chloride infusion  . epoetin alfa-epbx (RETACRIT) injection 40,000 Units     Objective: ROS  General: NAD EYES: denies vision changes, wears glasses ENMT: denies dysphagia Cardiovascular: denies chest pain Pulmonary: denies cough, endorses  increased SOB Abdomen: endorses fair appetite, endorses increasing constipation, endorses continence of bowel GU: denies dysuria, endorses continence of urine MSK:  endorses ROM limitations, no recent  falls reported Skin: denies rashes or wounds Neurological: endorses increased weakness, endorses occ pain, denies insomnia Psych: Endorses positive mood Heme/lymph/immuno: denies bruises, abnormal bleeding  Physical Exam: Current and past  weights: 185 lbs in hosp on 12/21, today 155 lbs 02/28/20, fluid was diuresed in hospital Constitutional: 140/69 HR 77 RR 18 Temp 96.9 General: frail appearing EYES: anicteric sclera, lids intact, no discharge  ENMT: intact hearing,oral mucous membranes moist CV: RRR Pulmonary: no increased work of breathing, no cough, no audible wheezes,oxygen 2 L Abdomen: intake 50%,  no ascites MSK: moderate sarcopenia,  decreased ROM in all extremities, walking with rehab Skin: warm and dry, no rashes or wounds on visible skin Neuro: Generalized weakness, slight cognitive impairment Psych: non-anxious affect, A and O x 2-3 Hem/lymph/immuno: no widespread bruising   Thank you for the opportunity to participate in the care of Ms. Dismore.  The palliative care team will continue to follow. Please call our office at [phone] if we can be of additional assistance.  Jason Coop, NP , DNP, MPH, AGPCNP-BC, ACHPN  COVID-19 PATIENT SCREENING TOOL  Person answering questions: ____________staff______ _____   1.  Is the patient or any family member in the home showing any signs or symptoms regarding respiratory infection?               Person with Symptom- __________NA_________________  a. Fever                                                                          Yes___ No___          ___________________  b. Shortness of breath                                                    Yes___ No___          ___________________ c. Cough/congestion                                       Yes___  No___         ___________________ d. Body aches/pains                                                         Yes___ No___        ____________________ e. Gastrointestinal symptoms (diarrhea, nausea)           Yes___ No___        ____________________  2. Within the past 14 days, has anyone living in the home had any contact with someone with or under investigation for COVID-19?    Yes___ No_X_   Person  __________________

## 2020-03-05 ENCOUNTER — Telehealth: Payer: Self-pay

## 2020-03-05 NOTE — Telephone Encounter (Signed)
1215 pm.  Message received from [place] requesting a call back.  Return call made to and [name] is asking for assistance with patient's cell phone.  She believes someone from [place] assisted patient in obtaining a cell phone at no charge.  Patient has been in the hospital and is now at [facility] and has not used the phone for over a month.  [name] states the phone has been deactiavated and a message is stating payment needs to be made before the phone can be reactivated.  Valla Leaver that I was not aware of a service like this but I would follow up with [place], SW when returns to the office tomorrow.  I asked how patient was doing and [name] states patient tested for + for COVID last week and there are at least 11 staff members that are +.  At this time, patient's only complaint has been a headache.  [name] states she would like to bring patient home as soon as she is physically able to come.  No other concerns voiced at this time.

## 2020-03-06 ENCOUNTER — Other Ambulatory Visit: Payer: Self-pay

## 2020-03-06 ENCOUNTER — Emergency Department: Payer: Medicare Other

## 2020-03-06 ENCOUNTER — Ambulatory Visit: Payer: Medicare Other | Admitting: Family

## 2020-03-06 ENCOUNTER — Inpatient Hospital Stay
Admission: EM | Admit: 2020-03-06 | Discharge: 2020-03-08 | DRG: 100 | Disposition: A | Discharge status: Skilled Nursing Facility | Payer: Medicare Other | Source: Skilled Nursing Facility | Attending: Internal Medicine | Admitting: Internal Medicine

## 2020-03-06 ENCOUNTER — Inpatient Hospital Stay: Payer: Medicare Other

## 2020-03-06 DIAGNOSIS — J4489 Other specified chronic obstructive pulmonary disease: Secondary | ICD-10-CM | POA: Diagnosis present

## 2020-03-06 DIAGNOSIS — Z7951 Long term (current) use of inhaled steroids: Secondary | ICD-10-CM

## 2020-03-06 DIAGNOSIS — Z885 Allergy status to narcotic agent status: Secondary | ICD-10-CM

## 2020-03-06 DIAGNOSIS — E1122 Type 2 diabetes mellitus with diabetic chronic kidney disease: Secondary | ICD-10-CM | POA: Diagnosis present

## 2020-03-06 DIAGNOSIS — U071 COVID-19: Secondary | ICD-10-CM

## 2020-03-06 DIAGNOSIS — I480 Paroxysmal atrial fibrillation: Secondary | ICD-10-CM

## 2020-03-06 DIAGNOSIS — R569 Unspecified convulsions: Principal | ICD-10-CM

## 2020-03-06 DIAGNOSIS — Z66 Do not resuscitate: Secondary | ICD-10-CM | POA: Diagnosis present

## 2020-03-06 DIAGNOSIS — Z803 Family history of malignant neoplasm of breast: Secondary | ICD-10-CM

## 2020-03-06 DIAGNOSIS — G8929 Other chronic pain: Secondary | ICD-10-CM | POA: Diagnosis present

## 2020-03-06 DIAGNOSIS — J449 Chronic obstructive pulmonary disease, unspecified: Secondary | ICD-10-CM | POA: Diagnosis present

## 2020-03-06 DIAGNOSIS — R4182 Altered mental status, unspecified: Secondary | ICD-10-CM | POA: Diagnosis present

## 2020-03-06 DIAGNOSIS — D631 Anemia in chronic kidney disease: Secondary | ICD-10-CM | POA: Diagnosis present

## 2020-03-06 DIAGNOSIS — R32 Unspecified urinary incontinence: Secondary | ICD-10-CM | POA: Diagnosis present

## 2020-03-06 DIAGNOSIS — B0229 Other postherpetic nervous system involvement: Secondary | ICD-10-CM | POA: Diagnosis present

## 2020-03-06 DIAGNOSIS — I2510 Atherosclerotic heart disease of native coronary artery without angina pectoris: Secondary | ICD-10-CM | POA: Diagnosis present

## 2020-03-06 DIAGNOSIS — Z888 Allergy status to other drugs, medicaments and biological substances status: Secondary | ICD-10-CM | POA: Diagnosis not present

## 2020-03-06 DIAGNOSIS — M1710 Unilateral primary osteoarthritis, unspecified knee: Secondary | ICD-10-CM | POA: Diagnosis present

## 2020-03-06 DIAGNOSIS — Z79899 Other long term (current) drug therapy: Secondary | ICD-10-CM

## 2020-03-06 DIAGNOSIS — I701 Atherosclerosis of renal artery: Secondary | ICD-10-CM | POA: Diagnosis present

## 2020-03-06 DIAGNOSIS — W010XXA Fall on same level from slipping, tripping and stumbling without subsequent striking against object, initial encounter: Secondary | ICD-10-CM | POA: Diagnosis present

## 2020-03-06 DIAGNOSIS — D638 Anemia in other chronic diseases classified elsewhere: Secondary | ICD-10-CM | POA: Diagnosis present

## 2020-03-06 DIAGNOSIS — E1165 Type 2 diabetes mellitus with hyperglycemia: Secondary | ICD-10-CM | POA: Diagnosis present

## 2020-03-06 DIAGNOSIS — G4700 Insomnia, unspecified: Secondary | ICD-10-CM | POA: Diagnosis present

## 2020-03-06 DIAGNOSIS — Z7902 Long term (current) use of antithrombotics/antiplatelets: Secondary | ICD-10-CM

## 2020-03-06 DIAGNOSIS — S52124A Nondisplaced fracture of head of right radius, initial encounter for closed fracture: Secondary | ICD-10-CM | POA: Diagnosis present

## 2020-03-06 DIAGNOSIS — D649 Anemia, unspecified: Secondary | ICD-10-CM | POA: Diagnosis present

## 2020-03-06 DIAGNOSIS — E039 Hypothyroidism, unspecified: Secondary | ICD-10-CM | POA: Diagnosis present

## 2020-03-06 DIAGNOSIS — Z6834 Body mass index (BMI) 34.0-34.9, adult: Secondary | ICD-10-CM

## 2020-03-06 DIAGNOSIS — Z882 Allergy status to sulfonamides status: Secondary | ICD-10-CM | POA: Diagnosis not present

## 2020-03-06 DIAGNOSIS — Z79890 Hormone replacement therapy (postmenopausal): Secondary | ICD-10-CM

## 2020-03-06 DIAGNOSIS — I4891 Unspecified atrial fibrillation: Secondary | ICD-10-CM

## 2020-03-06 DIAGNOSIS — I130 Hypertensive heart and chronic kidney disease with heart failure and stage 1 through stage 4 chronic kidney disease, or unspecified chronic kidney disease: Secondary | ICD-10-CM | POA: Diagnosis present

## 2020-03-06 DIAGNOSIS — N184 Chronic kidney disease, stage 4 (severe): Secondary | ICD-10-CM | POA: Diagnosis present

## 2020-03-06 DIAGNOSIS — I5032 Chronic diastolic (congestive) heart failure: Secondary | ICD-10-CM | POA: Diagnosis present

## 2020-03-06 DIAGNOSIS — E669 Obesity, unspecified: Secondary | ICD-10-CM | POA: Diagnosis present

## 2020-03-06 DIAGNOSIS — N200 Calculus of kidney: Secondary | ICD-10-CM

## 2020-03-06 DIAGNOSIS — Z96653 Presence of artificial knee joint, bilateral: Secondary | ICD-10-CM | POA: Diagnosis present

## 2020-03-06 DIAGNOSIS — Z955 Presence of coronary angioplasty implant and graft: Secondary | ICD-10-CM

## 2020-03-06 DIAGNOSIS — Z8543 Personal history of malignant neoplasm of ovary: Secondary | ICD-10-CM

## 2020-03-06 DIAGNOSIS — Z886 Allergy status to analgesic agent status: Secondary | ICD-10-CM | POA: Diagnosis not present

## 2020-03-06 DIAGNOSIS — R0602 Shortness of breath: Secondary | ICD-10-CM

## 2020-03-06 DIAGNOSIS — M79601 Pain in right arm: Secondary | ICD-10-CM | POA: Diagnosis present

## 2020-03-06 DIAGNOSIS — I10 Essential (primary) hypertension: Secondary | ICD-10-CM | POA: Diagnosis present

## 2020-03-06 DIAGNOSIS — R54 Age-related physical debility: Secondary | ICD-10-CM | POA: Diagnosis present

## 2020-03-06 LAB — CBC WITH DIFFERENTIAL/PLATELET
Abs Immature Granulocytes: 0.06 10*3/uL (ref 0.00–0.07)
Basophils Absolute: 0 10*3/uL (ref 0.0–0.1)
Basophils Relative: 0 %
Eosinophils Absolute: 0.1 10*3/uL (ref 0.0–0.5)
Eosinophils Relative: 1 %
HCT: 28.5 % — ABNORMAL LOW (ref 36.0–46.0)
Hemoglobin: 9 g/dL — ABNORMAL LOW (ref 12.0–15.0)
Immature Granulocytes: 1 %
Lymphocytes Relative: 12 %
Lymphs Abs: 0.9 10*3/uL (ref 0.7–4.0)
MCH: 29.7 pg (ref 26.0–34.0)
MCHC: 31.6 g/dL (ref 30.0–36.0)
MCV: 94.1 fL (ref 80.0–100.0)
Monocytes Absolute: 0.7 10*3/uL (ref 0.1–1.0)
Monocytes Relative: 9 %
Neutro Abs: 5.8 10*3/uL (ref 1.7–7.7)
Neutrophils Relative %: 77 %
Platelets: 126 10*3/uL — ABNORMAL LOW (ref 150–400)
RBC: 3.03 MIL/uL — ABNORMAL LOW (ref 3.87–5.11)
RDW: 15.9 % — ABNORMAL HIGH (ref 11.5–15.5)
WBC: 7.5 10*3/uL (ref 4.0–10.5)
nRBC: 0 % (ref 0.0–0.2)

## 2020-03-06 LAB — RESP PANEL BY RT-PCR (FLU A&B, COVID) ARPGX2
Influenza A by PCR: NEGATIVE
Influenza B by PCR: NEGATIVE
SARS Coronavirus 2 by RT PCR: POSITIVE — AB

## 2020-03-06 LAB — LACTIC ACID, PLASMA
Lactic Acid, Venous: 5.5 mmol/L (ref 0.5–1.9)
Lactic Acid, Venous: 2.4 mmol/L (ref 0.5–1.9)

## 2020-03-06 LAB — BASIC METABOLIC PANEL
Anion gap: 13 (ref 5–15)
BUN: 57 mg/dL — ABNORMAL HIGH (ref 8–23)
CO2: 17 mmol/L — ABNORMAL LOW (ref 22–32)
Calcium: 6.8 mg/dL — ABNORMAL LOW (ref 8.9–10.3)
Chloride: 110 mmol/L (ref 98–111)
Creatinine, Ser: 1.37 mg/dL — ABNORMAL HIGH (ref 0.44–1.00)
GFR, Estimated: 37 mL/min — ABNORMAL LOW (ref >60)
Glucose, Bld: 132 mg/dL — ABNORMAL HIGH (ref 70–99)
Potassium: 3.8 mmol/L (ref 3.5–5.1)
Sodium: 140 mmol/L (ref 135–145)

## 2020-03-06 LAB — TROPONIN I (HIGH SENSITIVITY)
Troponin I (High Sensitivity): 20 ng/L — ABNORMAL HIGH (ref <18)
Troponin I (High Sensitivity): 29 ng/L — ABNORMAL HIGH (ref <18)

## 2020-03-06 LAB — MAGNESIUM: Magnesium: 1.5 mg/dL — ABNORMAL LOW (ref 1.7–2.4)

## 2020-03-06 MED ORDER — SODIUM CHLORIDE 0.9 % IV SOLN
200.0000 mg | Freq: Once | INTRAVENOUS | Status: AC
  Administered 2020-03-06: 200 mg via INTRAVENOUS
  Filled 2020-03-06: qty 40

## 2020-03-06 MED ORDER — CLOPIDOGREL BISULFATE 75 MG PO TABS
75.0000 mg | ORAL_TABLET | Freq: Every day | ORAL | Status: DC
Start: 2020-03-06 — End: 2020-03-08
  Administered 2020-03-07 – 2020-03-08 (×2): 75 mg via ORAL
  Filled 2020-03-06 (×3): qty 1

## 2020-03-06 MED ORDER — LORAZEPAM 2 MG/ML IJ SOLN: 2.0000 mg | Freq: Once | INTRAMUSCULAR | Status: AC

## 2020-03-06 MED ORDER — SIMVASTATIN 20 MG PO TABS
40.0000 mg | ORAL_TABLET | Freq: Every day | ORAL | Status: DC
  Administered 2020-03-06 – 2020-03-07 (×2): 40 mg via ORAL
  Filled 2020-03-06: qty 4
  Filled 2020-03-06: qty 2

## 2020-03-06 MED ORDER — SODIUM CHLORIDE 0.9 % IV SOLN: 250.0000 mL | INTRAVENOUS | Status: DC | PRN

## 2020-03-06 MED ORDER — LEVETIRACETAM IN NACL 500 MG/100ML IV SOLN
500.0000 mg | Freq: Two times a day (BID) | INTRAVENOUS | Status: DC
  Administered 2020-03-06 – 2020-03-07 (×3): 500 mg via INTRAVENOUS
  Filled 2020-03-06 (×6): qty 100

## 2020-03-06 MED ORDER — LEVETIRACETAM IN NACL 1000 MG/100ML IV SOLN
1000.0000 mg | Freq: Once | INTRAVENOUS | Status: AC
  Administered 2020-03-06: 1000 mg via INTRAVENOUS
  Filled 2020-03-06: qty 100

## 2020-03-06 MED ORDER — TORSEMIDE 20 MG PO TABS
20.0000 mg | ORAL_TABLET | Freq: Every day | ORAL | Status: DC
  Administered 2020-03-07 – 2020-03-08 (×2): 20 mg via ORAL
  Filled 2020-03-06 (×4): qty 1

## 2020-03-06 MED ORDER — MOMETASONE FURO-FORMOTEROL FUM 200-5 MCG/ACT IN AERO
2.0000 | INHALATION_SPRAY | Freq: Two times a day (BID) | RESPIRATORY_TRACT | Status: DC
  Administered 2020-03-06 – 2020-03-08 (×4): 2 via RESPIRATORY_TRACT
  Filled 2020-03-06: qty 8.8

## 2020-03-06 MED ORDER — SODIUM CHLORIDE 0.9 % IV SOLN
100.0000 mg | Freq: Every day | INTRAVENOUS | Status: DC
  Administered 2020-03-07: 100 mg via INTRAVENOUS
  Filled 2020-03-06: qty 20

## 2020-03-06 MED ORDER — PREGABALIN 50 MG PO CAPS
50.0000 mg | ORAL_CAPSULE | Freq: Two times a day (BID) | ORAL | Status: DC
  Administered 2020-03-06 – 2020-03-08 (×4): 50 mg via ORAL
  Filled 2020-03-06: qty 1
  Filled 2020-03-06: qty 2
  Filled 2020-03-06 (×3): qty 1

## 2020-03-06 MED ORDER — MONTELUKAST SODIUM 10 MG PO TABS
10.0000 mg | ORAL_TABLET | Freq: Every day | ORAL | Status: DC
  Administered 2020-03-06 – 2020-03-07 (×2): 10 mg via ORAL
  Filled 2020-03-06 (×2): qty 1

## 2020-03-06 MED ORDER — MAGNESIUM SULFATE 2 GM/50ML IV SOLN
2.0000 g | Freq: Once | INTRAVENOUS | Status: AC
  Administered 2020-03-06: 2 g via INTRAVENOUS
  Filled 2020-03-06: qty 50

## 2020-03-06 MED ORDER — FENTANYL CITRATE (PF) 100 MCG/2ML IJ SOLN
25.0000 ug | Freq: Once | INTRAMUSCULAR | Status: AC
  Administered 2020-03-06: 25 ug via INTRAVENOUS
  Filled 2020-03-06: qty 2

## 2020-03-06 MED ORDER — ACETAMINOPHEN 500 MG PO TABS
1000.0000 mg | ORAL_TABLET | Freq: Four times a day (QID) | ORAL | Status: DC | PRN
  Administered 2020-03-06 – 2020-03-08 (×4): 1000 mg via ORAL
  Filled 2020-03-06 (×4): qty 2

## 2020-03-06 MED ORDER — LORAZEPAM 2 MG/ML IJ SOLN
INTRAMUSCULAR | Status: AC
  Administered 2020-03-06: 2 mg via INTRAVENOUS
  Filled 2020-03-06: qty 1

## 2020-03-06 MED ORDER — SODIUM CHLORIDE 0.9 % IV BOLUS
500.0000 mL | Freq: Once | INTRAVENOUS | Status: AC
  Administered 2020-03-06: 500 mL via INTRAVENOUS

## 2020-03-06 MED ORDER — SODIUM CHLORIDE 0.9% FLUSH
3.0000 mL | Freq: Two times a day (BID) | INTRAVENOUS | Status: DC
  Administered 2020-03-06 – 2020-03-08 (×3): 3 mL via INTRAVENOUS

## 2020-03-06 MED ORDER — LEVOTHYROXINE SODIUM 88 MCG PO TABS
88.0000 ug | ORAL_TABLET | Freq: Every day | ORAL | Status: DC
  Administered 2020-03-08: 06:00:00 88 ug via ORAL
  Filled 2020-03-06 (×2): qty 1

## 2020-03-06 MED ORDER — ENOXAPARIN SODIUM 30 MG/0.3ML ~~LOC~~ SOLN
30.0000 mg | SUBCUTANEOUS | Status: DC
  Administered 2020-03-06 – 2020-03-07 (×2): 30 mg via SUBCUTANEOUS
  Filled 2020-03-06 (×3): qty 0.3

## 2020-03-06 MED ORDER — SODIUM CHLORIDE 0.9% FLUSH: 3.0000 mL | INTRAVENOUS | Status: DC | PRN

## 2020-03-06 NOTE — ED Notes (Signed)
Attempting to swallow screen pt. Pt minimally alert. Does respond somewhat to voice, does answer yes or say okay. Able to drink a couple very small sips, but not enough to complete screening.   Pt produces minimal effort to eat apple sauce, only takes very small amount. Extended period for chew/swallow. Does not seem safe for pt to take any substantial PO intake at this time

## 2020-03-06 NOTE — ED Notes (Signed)
Per daughter, pt tested positive for Covid last Thursday, has remained asymptomatic.   Daughter states that pt is normally ambulatory, oriented. States she thinks she is on a blood thinner. She states that [facility] told her that the nurse heard a thud while she was using the bathroom, came in to pt groaning/moaning. Pt began seizure like activity afterwards

## 2020-03-06 NOTE — ED Notes (Signed)
EDP notified of BP and HR trending downwards

## 2020-03-06 NOTE — ED Notes (Signed)
Entered room after patient daughter requesting assistance-- daughter states pt requesting to turn to L side; staff nurse Zac at bedside to assist this nurse; pt repositioned to L side however unable to tolerate well d/t RUE pain - facial grimacing and moaning noted.  Daughter also states that patient told her she couldn't feel LUE however pt observed purposely moving LUE and when handed call bell pt able to grip same -- nurse did ask patient if she could feel call bell and pt acknowledges yes.  RR even and unlabored on RA with cont cardiac and pulse ox monitoring maintained-- SB 40s on monitor at this time.  Will monitor for acute changes and maintain plan of care as pt awaits bed assignment.

## 2020-03-06 NOTE — ED Notes (Signed)
Pt cleansed, changed into gown, bedding replaced, pt repositioned, warm blankets provided, new purewick in place, recliner given to daughter at bedside per request

## 2020-03-06 NOTE — Consult Note (Addendum)
NEURO HOSPITALIST CONSULT NOTE   Requesting physician: Dr. Si Raider  Reason for Consult: New onset seizures  History obtained from:  Daughter and Chart    HPI:                                                                                                                                          Amy [name] is an 87 y.o. female with a history of CAD, ovarian cancer, carotid disease, paroxysmal a-fib, dCHF, CKD, DM2, HTN, morbid obesity and hypothyrodism who presented to the ED after a seizure following a fall at her rehab facility described as "convulsions". BP was elevated afterwards, with SBP > 200. She had a second seizure in the ED witnessed by ED staff, which was described as generalized shaking. Following the third seizure, she was sleeping and not rousable. She has no prior history of seizures, head trauma or stroke. No reported symptoms of infectious illness. She tested positive for Covid on 1/6 but has been asymptomatic; she has been vaccinated. She has been postictal in the ED.   She has had issues with chronic severe RUE pain since having shingles involvement of that extremity last year.   Past Medical History:  Diagnosis Date  . Bladder incontinence   . CAD (coronary artery disease)    a. 05/2009 Cath: LAD 90p (Xience 2.75 x 12 mm DES), 32m D1 40, LCx 40p/m, RCA 30/40/30p/m, RCA 30/25d;  b.  12/2011 Lexiscan MV: no ischemia, breast attenuation artifact, normal EF-->Low risk; c. 10/2016 MV: fixed apical defect, most likely apical thinning and attenuation, No ischemia, EF 60%.  . Cancer ([facility]    ovarian  . Carotid arterial disease w/ R Carotid Bruit (HCC)    a. 08/2016 Carotid U/S: 40-59% bilat ICA stenosis - f/u 1 yr.  . Chronic diastolic (congestive) heart failure ([facility]    a. Echo 09/2015: EF 55-60% w/ Grade 1 DD, sev Ca2+ MV annulus, mildly dil LA; b. 09/2016 Echo: EF 65-70%, Gr2 DD, mildly dil LA/RA, nl RV fxn.  . Chronic Dyspnea on exertion   . CKD  (chronic kidney disease) stage 3, GFR 30-59 ml/min (HCC)   . Degenerative arthritis of knee    bilateral knees  . Diabetes mellitus    Type II  . GIB (gastrointestinal bleeding)    a. 08/2016 Admission w/ presyncope/anemia/melena-->req Transfusion-->endo ok, colonoscopy w/ polyps but no source of bleeding (most likely diverticular).  . Hiatal hernia   . Hypertension   . Iron deficiency   . Menopausal symptoms   . Morbid [place] ([place]   . Renal insufficiency   . Thyroid disease    hypothyroidism    Past Surgical History:  Procedure Laterality Date  . COLONOSCOPY  2015  . COLONOSCOPY WITH PROPOFOL N/A 09/25/2016  Procedure: COLONOSCOPY WITH PROPOFOL;  Surgeon: [name], MD;  Location: [facility] ENDOSCOPY;  Service: Gastroenterology;  Laterality: N/A;  . COLONOSCOPY WITH PROPOFOL N/A 09/26/2016   Procedure: COLONOSCOPY WITH PROPOFOL;  Surgeon: [name], MD;  Location: [facility] ENDOSCOPY;  Service: Gastroenterology;  Laterality: N/A;  . ESOPHAGOGASTRODUODENOSCOPY (EGD) WITH PROPOFOL N/A 09/25/2016   Procedure: ESOPHAGOGASTRODUODENOSCOPY (EGD) WITH PROPOFOL;  Surgeon: [name], MD;  Location[facility] ENDOSCOPY;  Service: Gastroenterology;  Laterality: N/A;  . RENAL ANGIOGRAPHY N/A 02/07/2019   Procedure: RENAL ANGIOGRAPHY;  Surgeon: [name], MD;  Location: [place] CV LAB;  Service: Cardiovascular;  Laterality: N/A;  . REPLACEMENT TOTAL KNEE BILATERAL    . TOTAL VAGINAL HYSTERECTOMY     ovarian mass, not cancerous  . UPPER GI ENDOSCOPY  2015    Family History  Problem Relation Age of Onset  . Breast cancer Mother 75             Social History:  reports that she has never smoked. She has never used smokeless tobacco. She reports that she does not drink alcohol and does not use drugs.  Allergies  Allergen Reactions  . Atenolol Other (See Comments)    Other reaction(s): Other (See Comments) Decreased heart rate Decreased heart rate  . Codeine Shortness Of Breath    Rash,  difficulty breathing, nausea.  . Losartan Other (See Comments)    Hyperkalemia  . Morphine And Related Shortness Of Breath    Rash, difficulty breathing, nausea.   . Nsaids Other (See Comments)    Other reaction(s): Unknown  . Rofecoxib     Other reaction(s): Unknown  . Sucralfate Other (See Comments)    Throat tightness  . Sulfa Antibiotics     Other reaction(s): Unknown    MEDICATIONS:                                                                                                                     Current Facility-Administered Medications on File Prior to Encounter  Medication Dose Route Frequency Provider Last Rate Last Admin  . 0.9 %  sodium chloride infusion   Intravenous Continuous Lloyd Huger, MD      . epoetin alfa-epbx (RETACRIT) injection 40,000 Units  40,000 Units Subcutaneous Once Lloyd Huger, MD       Current Outpatient Medications on File Prior to Encounter  Medication Sig Dispense Refill  . acetaminophen (TYLENOL) 500 MG tablet Take 1,000 mg by mouth 3 (three) times daily as needed for mild pain or moderate pain.    Marland Kitchen albuterol (PROVENTIL HFA;VENTOLIN HFA) 108 (90 Base) MCG/ACT inhaler Inhale 2 puffs into the lungs every 6 (six) hours as needed for wheezing or shortness of breath.    Marland Kitchen amitriptyline (ELAVIL) 10 MG tablet Take 10 mg by mouth at bedtime.    . cloNIDine (CATAPRES) 0.1 MG tablet Take 1 tablet (0.1 mg total) by mouth 2 (two) times daily. 60 tablet 11  . clopidogrel (PLAVIX) 75 MG tablet Take 1 tablet (75  mg total) by mouth daily. 90 tablet 3  . dexamethasone (DECADRON) 4 MG tablet Take 4 mg by mouth daily.    Marland Kitchen doxazosin (CARDURA) 8 MG tablet Take 8 mg by mouth daily.    . hydrochlorothiazide (HYDRODIURIL) 50 MG tablet Take 50 mg by mouth daily.    . iron polysaccharides (NIFEREX) 150 MG capsule Take 1 capsule (150 mg total) by mouth daily.    Marland Kitchen levothyroxine (SYNTHROID) 88 MCG tablet 88 mcg daily before breakfast.     . lisinopril  (ZESTRIL) 2.5 MG tablet Take 2.5 mg by mouth daily.    . melatonin 5 MG TABS Take 1 tablet (5 mg total) by mouth at bedtime as needed (sleep).  0  . montelukast (SINGULAIR) 10 MG tablet Take 10 mg by mouth at bedtime.     . nitroGLYCERIN (NITROSTAT) 0.4 MG SL tablet Place 1 tablet (0.4 mg total) under the tongue every 5 (five) minutes x 3 doses as needed for chest pain.  12  . pantoprazole (PROTONIX) 20 MG tablet Take 20 mg by mouth daily.     . pregabalin (LYRICA) 50 MG capsule Take 50-100 mg by mouth See admin instructions. Take 1 capsule (46m) by mouth every morning and take 2 capsules (1064m by mouth every night at bedtime    . simvastatin (ZOCOR) 40 MG tablet Take 1 tablet (40 mg total) by mouth at bedtime. 90 tablet 2  . SYMBICORT 160-4.5 MCG/ACT inhaler Inhale 2 puffs into the lungs 2 (two) times daily.     . Marland Kitchenorsemide (DEMADEX) 20 MG tablet Take 20 mg by mouth daily.     Scheduled: . clopidogrel  75 mg Oral Daily  . enoxaparin (LOVENOX) injection  30 mg Subcutaneous Q24H  . [START ON 03/07/2020] levothyroxine  88 mcg Oral QAC breakfast  . mometasone-formoterol  2 puff Inhalation BID  . montelukast  10 mg Oral QHS  . pregabalin  50 mg Oral BID  . simvastatin  40 mg Oral QHS  . sodium chloride flush  3 mL Intravenous Q12H  . torsemide  20 mg Oral Daily   Continuous: . sodium chloride    . magnesium sulfate bolus IVPB    . [START ON 03/07/2020] remdesivir 100 mg in NS 100 mL       ROS:                                                                                                                                       Unable to obtain due to postictal state.    Blood pressure (!) 120/42, pulse (!) 50, temperature 97.7 F (36.5 C), temperature source Oral, resp. rate 20, SpO2 96 %.   General Examination:  Physical Exam  HEENT-  [place]/AT    Lungs- Respirations  unlabored Extremities- No pitting edema  Neurological Examination Mental Status: Postictal somnolence to obtundation. Can answer some questions with single word answers. Can correctly state that she is in the hospital as well as her name, but not oriented to day, month, year or city. Does not answer the majority of questions asked. Will follow some simple commands. Pained affect.  Cranial Nerves: II: Blinks to threat in left hemifield but not consistently in right hemifield. PERRL.  III,IV, VI: Will briefly open eyes. Stares straight forward and does not initiate eye contact. Will initiate hypometric saccades to the right and left after repeated commands.  V: Reacts to touch bilaterally VII: Right lower quadrant of face contracts less than left when yawning. Hypomimia noted.  VIII: hearing intact to some questions.  IX,X: Hypophonic speech XI: Head is midline.  XII: Weak midline tongue extension Motor: Confounded by pain and poor effort. She will weakly grip on the right and left, more forcefully on the left. Not following commands for more in depth motor testing of upper extremities.  Will weakly withdraw BLE, right somewhat more briskly than left. Exam confounded by apparent joint pain.  Sensory: Will react to FT x 4, but unable to participate in detailed sensory exam.  Deep Tendon Reflexes: Deferred due to pain.  Plantars: Equivocal bilaterally  Cerebellar: Unable to assess Gait: Unable to assess   Lab Results: Basic Metabolic Panel: Recent Labs  Lab 03/06/20 0945 03/06/20 1125  NA 140  --   K 3.8  --   CL 110  --   CO2 17*  --   GLUCOSE 132*  --   BUN 57*  --   CREATININE 1.37*  --   CALCIUM 6.8*  --   MG  --  1.5*    CBC: Recent Labs  Lab 03/06/20 0945  WBC 7.5  NEUTROABS 5.8  HGB 9.0*  HCT 28.5*  MCV 94.1  PLT 126*    Cardiac Enzymes: No results for input(s): CKTOTAL, CKMB, CKMBINDEX, TROPONINI in the last 168 hours.  Lipid Panel: No results for input(s):  CHOL, TRIG, HDL, CHOLHDL, VLDL, LDLCALC in the last 168 hours.  Imaging: DG Elbow 2 Views Right  Result Date: 03/06/2020 CLINICAL DATA:  Fall with right arm pain and possible deformity. EXAM: RIGHT ELBOW - 2 VIEW COMPARISON:  None. FINDINGS: Two views of the right elbow were obtained. Right elbow is located. Concern for small cortical step-off versus spurring at the radial neck. No other areas are concerning for fracture. No definite elbow joint effusion. Alignment of the elbow is normal. IMPRESSION: Irregularity at the radial head and neck. This finding could be related to spurring but difficult to exclude a mildly displaced fracture. There is not a definite elbow joint effusion and the findings at the radial head / neck are equivocal. If there is high clinical concern for acute injury, recommend further characterization with CT. Electronically Signed   By: Markus Daft M.D.   On: 03/06/2020 09:22   CT Head Wo Contrast  Result Date: 03/06/2020 CLINICAL DATA:  Nontraumatic seizure, fall with seizure like activity today EXAM: CT HEAD WITHOUT CONTRAST TECHNIQUE: Contiguous axial images were obtained from the base of the skull through the vertex without intravenous contrast. Sagittal and coronal MPR images reconstructed from axial data set. COMPARISON:  08/18/2019 FINDINGS: Brain: Generalized atrophy. Normal ventricular morphology. No midline shift or mass effect. Small vessel chronic ischemic changes of deep cerebral  white matter. No intracranial hemorrhage, mass lesion, evidence of acute infarction, or extra-axial fluid collection. Vascular: Atherosclerotic calcifications of internal carotid and vertebral arteries at skull base Skull: Intact.  Hyperostosis frontalis interna present. Sinuses/Orbits: Clear Other: N/A IMPRESSION: Atrophy with small vessel chronic ischemic changes of deep cerebral white matter. No acute intracranial abnormalities. Electronically Signed   By: Lavonia Dana M.D.   On: 03/06/2020 09:14    Portable chest 1 View  Result Date: 03/06/2020 CLINICAL DATA:  Fall. EXAM: PORTABLE CHEST 1 VIEW COMPARISON:  02/08/2020. FINDINGS: Cardiomegaly again noted. Mild bilateral interstitial prominence again noted, improved from prior exam. Stable calcified densities left lung base. Interval near complete resolution of left pleural effusion. Interval resolution of right pleural effusion. No pneumothorax. Degenerative changes scoliosis thoracic spine. No acute bony abnormality. IMPRESSION: 1. Cardiomegaly again noted. Mild bilateral interstitial prominence again noted, improved from prior exam. 2. Interval near complete resolution of left pleural effusion. Interval resolution of right pleural effusion. Electronically Signed   By: Marcello Moores  Register   On: 03/06/2020 13:47    Assessment: 87 year old female presenting with nondisplaced extra-articular right radial head fracture and new onset of seizure-like activity 1. Exam reveals findings suggestive of possible left cerebral hemisphere infarction, including no blink to threat in right visual hemifield, right lower quadrant facial weakness and decreased movement of RUE relative to left. Of note, the latter finding may be secondary to chronic right arm pain following shingles (post-herpetic neuralgia), rather than weakness. Given advanced age, multiple medical comorbidities and debility, she would not be a candidate for thrombectomy from a risk/benefit standpoint.  2. New onset seizures. Given multiple seizures, will need to be started on an anticonvulsant.  3. Hypomagnesemia and low total serum calcium. May have contributed to onset of her initial seizure.   Recommendations: 1. Keppra 1000 mg IV has been loaded in the ED.  2. Will start low-dose IV Keppra at 500 mg BID given low eGFR of 37. First scheduled dose at 9:00 PM.  3. EEG (ordered) 4. MRI brain when able.  5. Management of electrolytes per primary team.     Electronically signed: Dr. Kerney Elbe 03/06/2020, 3:37 PM

## 2020-03-06 NOTE — ED Notes (Signed)
Pt daughter reports that last June, one of her doctors (cardiologist?) had imaging done which was suspicious for a stroke

## 2020-03-06 NOTE — ED Triage Notes (Signed)
Pt arrives via EMS from [facility] with complaint of fall this AM at approx 0630. Facility reports that pt begun having seizure like activity prior to EMS arrival.   Pt on arrival is non-communicative, some deviated gaze to the left, moderate generalized tremors. Pt given 2mg  ativan with improvement. Possible deformity to right upper arm.   Pt is DNR, form at bedside

## 2020-03-06 NOTE — ED Notes (Signed)
Telephone report to [place], Therapist, sports  (C POD)

## 2020-03-06 NOTE — Consult Note (Signed)
ORTHOPAEDIC CONSULTATION  REQUESTING PHYSICIAN: Wouk, Ailene Rud, MD  Chief Complaint:   Right elbow pain.  History of Present Illness: Amy [name] is a 87 y.o. female with multiple medical problems including hypertension, hypothyroidism, obesity, chronic renal insufficiency, diabetes, congestive heart failure, and coronary artery disease who normally lives on her own and uses a walker for ambulation.  The patient has been in a rehab facility for the past month while recuperating from an episode of congestive heart failure.  Apparently, she lost her balance and fell early this morning while getting up to go to the bathroom and injuring her right elbow.  Shortly after being discovered, she apparently had a seizure, prompting the EMS to be called.  By the time they got there, the seizure had stopped but she was postictal.  She was brought to the emergency room where work-up for her seizures has begun.  In addition, x-rays demonstrated a nondisplaced right radial head fracture, prompting an orthopedic referral.  The patient is poorly responsive and so no further history can be obtained from the patient.  Past Medical History:  Diagnosis Date  . Bladder incontinence   . CAD (coronary artery disease)    a. 05/2009 Cath: LAD 90p (Xience 2.75 x 12 mm DES), 63m, D1 40, LCx 40p/m, RCA 30/40/30p/m, RCA 30/25d;  b.  12/2011 Lexiscan MV: no ischemia, breast attenuation artifact, normal EF-->Low risk; c. 10/2016 MV: fixed apical defect, most likely apical thinning and attenuation, No ischemia, EF 60%.  . Cancer ([place])    ovarian  . Carotid arterial disease w/ R Carotid Bruit (HCC)    a. 08/2016 Carotid U/S: 40-59% bilat ICA stenosis - f/u 1 yr.  . Chronic diastolic (congestive) heart failure ([place])    a. Echo 09/2015: EF 55-60% w/ Grade 1 DD, sev Ca2+ MV annulus, mildly dil LA; b. 09/2016 Echo: EF 65-70%, Gr2 DD, mildly dil LA/RA, nl RV fxn.   . Chronic Dyspnea on exertion   . CKD (chronic kidney disease) stage 3, GFR 30-59 ml/min (HCC)   . Degenerative arthritis of knee    bilateral knees  . Diabetes mellitus    Type II  . GIB (gastrointestinal bleeding)    a. 08/2016 Admission w/ presyncope/anemia/melena-->req Transfusion-->endo ok, colonoscopy w/ polyps but no source of bleeding (most likely diverticular).  . Hiatal hernia   . Hypertension   . Iron deficiency   . Menopausal symptoms   . Morbid obesity ([place])   . Renal insufficiency   . Thyroid disease    hypothyroidism   Past Surgical History:  Procedure Laterality Date  . COLONOSCOPY  2015  . COLONOSCOPY WITH PROPOFOL N/A 09/25/2016   Procedure: COLONOSCOPY WITH PROPOFOL;  Surgeon: [name], MD;  Location: [facility] ENDOSCOPY;  Service: Gastroenterology;  Laterality: N/A;  . COLONOSCOPY WITH PROPOFOL N/A 09/26/2016   Procedure: COLONOSCOPY WITH PROPOFOL;  Surgeon: [name], MD;  Location: [facility] ENDOSCOPY;  Service: Gastroenterology;  Laterality: N/A;  . ESOPHAGOGASTRODUODENOSCOPY (EGD) WITH PROPOFOL N/A 09/25/2016   Procedure: ESOPHAGOGASTRODUODENOSCOPY (EGD) WITH PROPOFOL;  Surgeon: [name], MD;  Location: [facility] ENDOSCOPY;  Service: Gastroenterology;  Laterality: N/A;  . RENAL ANGIOGRAPHY N/A 02/07/2019   Procedure: RENAL ANGIOGRAPHY;  Surgeon: [name], MD;  Location: [facility] LAB;  Service: Cardiovascular;  Laterality: N/A;  . REPLACEMENT TOTAL KNEE BILATERAL    . TOTAL VAGINAL HYSTERECTOMY     ovarian mass, not cancerous  . UPPER GI ENDOSCOPY  2015   Social History   Socioeconomic History  .  Marital status: Widowed    Spouse name: Not on file  . Number of children: Not on file  . Years of education: Not on file  . Highest education level: Not on file  Occupational History  . Not on file  Tobacco Use  . Smoking status: Never Smoker  . Smokeless tobacco: Never Used  Vaping Use  . Vaping Use: Never used  Substance and Sexual Activity  .  Alcohol use: No  . Drug use: No  . Sexual activity: Not on file  Other Topics Concern  . Not on file  Social History Narrative  . Not on file   Social Determinants of Health   Financial Resource Strain: Not on file  Food Insecurity: Not on file  Transportation Needs: Not on file  Physical Activity: Not on file  Stress: Not on file  Social Connections: Not on file   Family History  Problem Relation Age of Onset  . Breast cancer Mother 41   Allergies  Allergen Reactions  . Atenolol Other (See Comments)    Other reaction(s): Other (See Comments) Decreased heart rate Decreased heart rate  . Codeine Shortness Of Breath    Rash, difficulty breathing, nausea.  . Losartan Other (See Comments)    Hyperkalemia  . Morphine And Related Shortness Of Breath    Rash, difficulty breathing, nausea.   . Nsaids Other (See Comments)    Other reaction(s): Unknown  . Rofecoxib     Other reaction(s): Unknown  . Sucralfate Other (See Comments)    Throat tightness  . Sulfa Antibiotics     Other reaction(s): Unknown   Prior to Admission medications   Medication Sig Start Date End Date Taking? Authorizing Provider  acetaminophen (TYLENOL) 500 MG tablet Take 1,000 mg by mouth 3 (three) times daily as needed for mild pain or moderate pain.   Yes [provider]  albuterol (PROVENTIL HFA;VENTOLIN HFA) 108 (90 Base) MCG/ACT inhaler Inhale 2 puffs into the lungs every 6 (six) hours as needed for wheezing or shortness of breath.   Yes [provider]  amitriptyline (ELAVIL) 10 MG tablet Take 10 mg by mouth at bedtime. 12/04/19  Yes [provider]  cloNIDine (CATAPRES) 0.1 MG tablet Take 1 tablet (0.1 mg total) by mouth 2 (two) times daily. 02/14/20  Yes Enzo Bi, MD  clopidogrel (PLAVIX) 75 MG tablet Take 1 tablet (75 mg total) by mouth daily. 09/27/16  Yes Dustin Flock, MD  dexamethasone (DECADRON) 4 MG tablet Take 4 mg by mouth daily. 03/01/20 03/08/20 Yes [provider]  doxazosin (CARDURA) 8 MG tablet Take 8 mg by mouth daily. 12/20/19  Yes [provider]  hydrochlorothiazide (HYDRODIURIL) 50 MG tablet Take 50 mg by mouth daily. 01/10/20 01/09/21 Yes [provider]  iron polysaccharides (NIFEREX) 150 MG capsule Take 1 capsule (150 mg total) by mouth daily. 02/15/20  Yes Enzo Bi, MD  levothyroxine (SYNTHROID) 88 MCG tablet 88 mcg daily before breakfast.  06/20/19  Yes [provider]  lisinopril (ZESTRIL) 2.5 MG tablet Take 2.5 mg by mouth daily. 01/10/20 01/09/21 Yes [provider]  melatonin 5 MG TABS Take 1 tablet (5 mg total) by mouth at bedtime as needed (sleep). 02/14/20  Yes Enzo Bi, MD  montelukast (SINGULAIR) 10 MG tablet Take 10 mg by mouth at bedtime.  03/24/19  Yes [provider]  nitroGLYCERIN (NITROSTAT) 0.4 MG SL tablet Place 1 tablet (0.4 mg total) under the tongue every 5 (five) minutes x 3 doses  as needed for chest pain. 08/23/19  Yes Enzo Bi, MD  pantoprazole (PROTONIX) 20 MG tablet Take 20 mg by mouth daily.    Yes [provider]  pregabalin (LYRICA) 50 MG capsule Take 50-100 mg by mouth See admin instructions. Take 1 capsule (50mg ) by mouth every morning and take 2 capsules (100mg ) by mouth every night at bedtime   Yes [provider]  simvastatin (ZOCOR) 40 MG tablet Take 1 tablet (40 mg total) by mouth at bedtime. 03/22/18  Yes Minna Merritts, MD  SYMBICORT 160-4.5 MCG/ACT inhaler Inhale 2 puffs into the lungs 2 (two) times daily.  11/15/14  Yes [provider]  torsemide (DEMADEX) 20 MG tablet Take 20 mg by mouth daily.   Yes [provider]   DG Elbow 2 Views Right  Result Date: 03/06/2020 CLINICAL DATA:  Fall with right arm pain and possible deformity. EXAM: RIGHT ELBOW - 2 VIEW COMPARISON:  None. FINDINGS: Two views of the right elbow were obtained. Right elbow is located. Concern for small cortical step-off versus spurring at the  radial neck. No other areas are concerning for fracture. No definite elbow joint effusion. Alignment of the elbow is normal. IMPRESSION: Irregularity at the radial head and neck. This finding could be related to spurring but difficult to exclude a mildly displaced fracture. There is not a definite elbow joint effusion and the findings at the radial head / neck are equivocal. If there is high clinical concern for acute injury, recommend further characterization with CT. Electronically Signed   By: Markus Daft M.D.   On: 03/06/2020 09:22   CT Head Wo Contrast  Result Date: 03/06/2020 CLINICAL DATA:  Nontraumatic seizure, fall with seizure like activity today EXAM: CT HEAD WITHOUT CONTRAST TECHNIQUE: Contiguous axial images were obtained from the base of the skull through the vertex without intravenous contrast. Sagittal and coronal MPR images reconstructed from axial data set. COMPARISON:  08/18/2019 FINDINGS: Brain: Generalized atrophy. Normal ventricular morphology. No midline shift or mass effect. Small vessel chronic ischemic changes of deep cerebral white matter. No intracranial hemorrhage, mass lesion, evidence of acute infarction, or extra-axial fluid collection. Vascular: Atherosclerotic calcifications of internal carotid and vertebral arteries at skull base Skull: Intact.  Hyperostosis frontalis interna present. Sinuses/Orbits: Clear Other: N/A IMPRESSION: Atrophy with small vessel chronic ischemic changes of deep cerebral white matter. No acute intracranial abnormalities. Electronically Signed   By: Lavonia Dana M.D.   On: 03/06/2020 09:14   Portable chest 1 View  Result Date: 03/06/2020 CLINICAL DATA:  Fall. EXAM: PORTABLE CHEST 1 VIEW COMPARISON:  02/08/2020. FINDINGS: Cardiomegaly again noted. Mild bilateral interstitial prominence again noted, improved from prior exam. Stable calcified densities left lung base. Interval near complete resolution of left pleural effusion. Interval resolution of right  pleural effusion. No pneumothorax. Degenerative changes scoliosis thoracic spine. No acute bony abnormality. IMPRESSION: 1. Cardiomegaly again noted. Mild bilateral interstitial prominence again noted, improved from prior exam. 2. Interval near complete resolution of left pleural effusion. Interval resolution of right pleural effusion. Electronically Signed   By: Marcello Moores  Register   On: 03/06/2020 13:47    Positive ROS: All other systems have been reviewed and were otherwise negative with the exception of those mentioned in the HPI and as above.  Physical Exam: General: Groggy, but in no acute distress Psychiatric: Patient not responding to verbal commands Cardiovascular:  No pedal edema Respiratory:  No wheezing, non-labored breathing GI:  Abdomen is soft and non-tender Skin:  No lesions  in the area of chief complaint Neurologic:  Sensation intact distally Lymphatic:  No axillary or cervical lymphadenopathy  Orthopedic Exam:  Orthopedic examination is limited to the right upper extremity and elbow.  There is a superficial abrasion over the lateral aspect of the elbow, but there is no erythema, ecchymosis, abrasions, or other skin abnormalities identified.  There is at most a trace effusion of the elbow.  She has moderate tenderness to palpation over the lateral aspect of the elbow.  She also has pain with any attempted passive motion of the elbow.  She is not able to respond to verbal commands in order to assess her neurovascular status to the right hand, but exhibits good capillary refill to the right hand.  X-rays:  AP and lateral x-rays of the right elbow are available for review and have been reviewed by myself.  These films demonstrate a minimally impacted and nondisplaced radial head fracture.  No lytic lesions or significant degenerative changes of the elbow are noted.  Assessment: Nondisplaced extra-articular right radial head fracture.  Plan: The treatment options were discussed  with the patient's daughter who is at the bedside.  She is reassured that this injury does not require surgical intervention and that can be it can be treated with simple immobilization for a few weeks.  Once her symptoms permit, she may begin passive and active range of motion of the elbow.  Meanwhile, she is to remain in her sling at this time and may receive appropriate pain medication as deemed necessary.  Thank you for asking me to participate in the care of this most unfortunate woman.  I will be happy to follow her with you.   Pascal Lux, MD  Beeper #:  [phone]  03/06/2020 2:52 PM

## 2020-03-06 NOTE — ED Provider Notes (Signed)
[facility] Emergency Department Provider Note ____________________________________________   Event Date/Time   First MD Initiated Contact with Patient 03/06/20 0845     (approximate)  I have reviewed the triage vital signs and the nursing notes.   HISTORY  Chief Complaint Fall and Seizures  Level 5 caveat: History of present illness limited due to altered mental status  HPI Amy [name] is a 87 y.o. female with PMH as noted below and no known seizure disorder who presents from her skilled nursing facility after a fall earlier this morning with apparent seizures.  Per EMS, the patient had an unwitnessed fall and was found around 6:30 AM.  Apparently family initially declined transportation to the hospital.  She had a possible deformity to the right elbow.  Subsequently, the patient started to have generalized seizures.  EMS reports that the seizure activity stopped before they got there, but the patient has been altered and nonverbal.  Past Medical History:  Diagnosis Date  . Bladder incontinence   . CAD (coronary artery disease)    a. 05/2009 Cath: LAD 90p (Xience 2.75 x 12 mm DES), 82m, D1 40, LCx 40p/m, RCA 30/40/30p/m, RCA 30/25d;  b.  12/2011 Lexiscan MV: no ischemia, breast attenuation artifact, normal EF-->Low risk; c. 10/2016 MV: fixed apical defect, most likely apical thinning and attenuation, No ischemia, EF 60%.  . Cancer ([place])    ovarian  . Carotid arterial disease w/ R Carotid Bruit (HCC)    a. 08/2016 Carotid U/S: 40-59% bilat ICA stenosis - f/u 1 yr.  . Chronic diastolic (congestive) heart failure ([place])    a. Echo 09/2015: EF 55-60% w/ Grade 1 DD, sev Ca2+ MV annulus, mildly dil LA; b. 09/2016 Echo: EF 65-70%, Gr2 DD, mildly dil LA/RA, nl RV fxn.  . Chronic Dyspnea on exertion   . CKD (chronic kidney disease) stage 3, GFR 30-59 ml/min (HCC)   . Degenerative arthritis of knee    bilateral knees  . Diabetes mellitus    Type II  . GIB  (gastrointestinal bleeding)    a. 08/2016 Admission w/ presyncope/anemia/melena-->req Transfusion-->endo ok, colonoscopy w/ polyps but no source of bleeding (most likely diverticular).  . Hiatal hernia   . Hypertension   . Iron deficiency   . Menopausal symptoms   . Morbid obesity ([name])   . Renal insufficiency   . Thyroid disease    hypothyroidism    Patient Active Problem List   Diagnosis Date Noted  . Atrial fibrillation ([place]) 03/06/2020  . COVID-19 virus infection 03/06/2020  . Seizure ([place]) 03/06/2020  . New onset seizure ([place]) 03/06/2020  . Acute on chronic diastolic heart failure ([place]) 02/02/2020  . Moderate persistent asthma 02/02/2020  . SOB (shortness of breath) 02/02/2020  . Anemia associated with chronic renal failure 10/22/2019  . Postherpetic neuralgia 10/18/2019  . Neuropathic pain of chest 10/18/2019  . Chronic pain syndrome 10/18/2019  . NSTEMI (non-ST elevated myocardial infarction) ([place]) 08/18/2019  . Syncope and collapse 08/13/2019  . Unresponsiveness 08/12/2019  . Hyperkalemia 08/12/2019  . HTN (hypertension) 08/12/2019  . GERD (gastroesophageal reflux disease) 08/12/2019  . Hypothyroidism 08/12/2019  . CAD (coronary artery disease) 08/12/2019  . Shingles 08/12/2019  . Carotid arterial disease ([place])   . Hypotension   . Groin pain, right   . Chest pain 08/06/2019  . Renovascular hypertension 03/08/2019  . Renal artery stenosis ([place]) 01/28/2019  . Multiple renal cysts 12/29/2018  . Renal stone 12/29/2018  . CKD (chronic kidney disease) stage  4, GFR 15-29 ml/min ([place]) 11/23/2018  . Diabetes ([place]) 06/29/2017  . CAP (community acquired pneumonia) 05/16/2017  . GI bleed 09/21/2016  . Vitamin D deficiency 11/04/2015  . Multiple lung nodules   . COPD with chronic bronchitis ([place])   . New onset atrial fibrillation ([place]) 10/04/2015  . Anemia 10/04/2015  . Acute kidney injury superimposed on CKD ([place]) 10/03/2015  . Palliative care encounter 06/05/2015  .  Morbid obesity ([place]) 12/05/2014  . Angina pectoris associated with type 2 diabetes mellitus ([place]) 12/05/2014  . Type 2 diabetes mellitus with hyperglycemia ([place]) 08/22/2014  . Chronic diastolic CHF (congestive heart failure) ([place]) 09/06/2013  . Preop cardiovascular exam 06/03/2012  . Iron deficiency anemia 09/08/2011  . HLD (hyperlipidemia) 07/18/2009  . Coronary artery disease involving native coronary artery of native heart without angina pectoris 07/18/2009  . Acquired hypothyroidism 07/12/2009  . Essential hypertension 07/12/2009    Past Surgical History:  Procedure Laterality Date  . COLONOSCOPY  2015  . COLONOSCOPY WITH PROPOFOL N/A 09/25/2016   Procedure: COLONOSCOPY WITH PROPOFOL;  Surgeon: [name], MD;  Location: [facility] ENDOSCOPY;  Service: Gastroenterology;  Laterality: N/A;  . COLONOSCOPY WITH PROPOFOL N/A 09/26/2016   Procedure: COLONOSCOPY WITH PROPOFOL;  Surgeon: [name], MD;  Location: [facility] ENDOSCOPY;  Service: Gastroenterology;  Laterality: N/A;  . ESOPHAGOGASTRODUODENOSCOPY (EGD) WITH PROPOFOL N/A 09/25/2016   Procedure: ESOPHAGOGASTRODUODENOSCOPY (EGD) WITH PROPOFOL;  Surgeon: [name], MD;  Location: [facility] ENDOSCOPY;  Service: Gastroenterology;  Laterality: N/A;  . RENAL ANGIOGRAPHY N/A 02/07/2019   Procedure: RENAL ANGIOGRAPHY;  Surgeon: [name], MD;  Location: [facility] CV LAB;  Service: Cardiovascular;  Laterality: N/A;  . REPLACEMENT TOTAL KNEE BILATERAL    . TOTAL VAGINAL HYSTERECTOMY     ovarian mass, not cancerous  . UPPER GI ENDOSCOPY  2015    Prior to Admission medications   Medication Sig Start Date End Date Taking? Authorizing Provider  acetaZOLAMIDE (DIAMOX) 250 MG tablet Take 1 tablet (250 mg total) by mouth 2 (two) times daily for 7 days. 02/14/20 02/21/20  Enzo Bi, MD  albuterol (PROVENTIL HFA;VENTOLIN HFA) 108 (90 Base) MCG/ACT inhaler Inhale 2 puffs into the lungs every 6 (six) hours as needed for wheezing or shortness of breath.     [provider]  amitriptyline (ELAVIL) 10 MG tablet Take 10 mg by mouth at bedtime. 12/04/19   [provider]  budesonide (PULMICORT) 0.5 MG/2ML nebulizer solution Inhale 0.5 mg into the lungs daily. 03/16/19 04/04/20  [provider]  cloNIDine (CATAPRES - DOSED IN MG/24 HR) 0.2 mg/24hr patch Hold until followup with outpatient doctor due to blood pressure sometimes being low.  Was replaced with oral clonidine. Patient not taking: Reported on 02/28/2020 02/14/20   Enzo Bi, MD  cloNIDine (CATAPRES) 0.1 MG tablet Take 1 tablet (0.1 mg total) by mouth 2 (two) times daily. 02/14/20   Enzo Bi, MD  clopidogrel (PLAVIX) 75 MG tablet Take 1 tablet (75 mg total) by mouth daily. 09/27/16   Dustin Flock, MD  doxazosin (CARDURA) 8 MG tablet Take 8 mg by mouth daily. 12/20/19   [provider]  hydrochlorothiazide (HYDRODIURIL) 50 MG tablet Take 50 mg by mouth daily. 01/10/20 01/09/21  [provider]  ipratropium-albuterol (DUONEB) 0.5-2.5 (3) MG/3ML SOLN Take 3 mLs by nebulization every 6 (six) hours as needed. 02/14/20   Enzo Bi, MD  iron polysaccharides (NIFEREX) 150 MG capsule Take 1 capsule (150 mg total) by mouth daily. 02/15/20   Enzo Bi, MD  levothyroxine (SYNTHROID) 88 MCG tablet 88 mcg daily before breakfast.  06/20/19   [provider]  lisinopril (ZESTRIL) 2.5 MG tablet Take 2.5 mg by mouth daily. 01/10/20 01/09/21  [provider]  melatonin 5 MG TABS Take 1 tablet (5 mg total) by mouth at bedtime as needed (sleep). 02/14/20   Enzo Bi, MD  montelukast (SINGULAIR) 10 MG tablet Take 10 mg by mouth at bedtime.  03/24/19   [provider]  nitroGLYCERIN (NITROSTAT) 0.4 MG SL tablet Place 1 tablet (0.4 mg total) under the tongue every 5 (five) minutes x 3 doses as needed for chest pain. 08/23/19   Enzo Bi, MD  pantoprazole (PROTONIX) 40 MG tablet Take 20 mg by mouth daily.     [provider]  pregabalin (LYRICA) 50  MG capsule Take 2 capsules (100 mg total) by mouth at bedtime. Patient taking differently: Take 50 mg by mouth 2 (two) times daily. 02/14/20   Enzo Bi, MD  simvastatin (ZOCOR) 40 MG tablet Take 1 tablet (40 mg total) by mouth at bedtime. 03/22/18   Minna Merritts, MD  SYMBICORT 160-4.5 MCG/ACT inhaler Inhale 2 puffs into the lungs 2 (two) times daily.  11/15/14   [provider]  torsemide (DEMADEX) 20 MG tablet Take 20 mg by mouth daily. 09/21/19 02/28/20  [provider]    Allergies Atenolol, Codeine, Losartan, Morphine and related, Nsaids, Rofecoxib, Sucralfate, and Sulfa antibiotics  Family History  Problem Relation Age of Onset  . Breast cancer Mother 77    Social History Social History   Tobacco Use  . Smoking status: Never Smoker  . Smokeless tobacco: Never Used  Vaping Use  . Vaping Use: Never used  Substance Use Topics  . Alcohol use: No  . Drug use: No    Review of Systems Level 5 caveat: Unable to obtain review of systems due to altered mental status   ____________________________________________   PHYSICAL EXAM:  VITAL SIGNS: ED Triage Vitals  Enc Vitals Group     BP      Pulse      Resp      Temp      Temp src      SpO2      Weight      Height      Head Circumference      Peak Flow      Pain Score      Pain Loc      Pain Edu?      Excl. in [place]?     Constitutional: Somnolent, moaning.  Chronically weak and frail appearing. Eyes: Conjunctivae are normal.  EOMI.  PERRLA. Head: Atraumatic. Nose: No congestion/rhinnorhea. Mouth/Throat: Mucous membranes are dry.   Neck: Normal range of motion.  Cardiovascular: Normal rate, regular rhythm. Grossly normal heart sounds.  Good peripheral circulation. Respiratory: Normal respiratory effort.  No retractions. Lungs CTAB. Gastrointestinal: Soft and nontender. No distention.  Genitourinary: No flank tenderness. Musculoskeletal: Extremities warm and well perfused.  Neurologic: Motor  intact in all extremities. Skin:  Skin is warm and dry. No rash noted. Psychiatric: Unable to assess due to altered mental status.  ____________________________________________   LABS (all labs ordered are listed, but only abnormal results are displayed)  Labs Reviewed  RESP PANEL BY RT-PCR (FLU A&B, COVID) ARPGX2 - Abnormal; Notable for the following components:      Result Value   SARS Coronavirus 2 by RT PCR POSITIVE (*)    All other components within normal  limits  BASIC METABOLIC PANEL - Abnormal; Notable for the following components:   CO2 17 (*)    Glucose, Bld 132 (*)    BUN 57 (*)    Creatinine, Ser 1.37 (*)    Calcium 6.8 (*)    GFR, Estimated 37 (*)    All other components within normal limits  CBC WITH DIFFERENTIAL/PLATELET - Abnormal; Notable for the following components:   RBC 3.03 (*)    Hemoglobin 9.0 (*)    HCT 28.5 (*)    RDW 15.9 (*)    Platelets 126 (*)    All other components within normal limits  LACTIC ACID, PLASMA - Abnormal; Notable for the following components:   Lactic Acid, Venous 5.5 (*)    All other components within normal limits  LACTIC ACID, PLASMA - Abnormal; Notable for the following components:   Lactic Acid, Venous 2.4 (*)    All other components within normal limits  MAGNESIUM - Abnormal; Notable for the following components:   Magnesium 1.5 (*)    All other components within normal limits  TROPONIN I (HIGH SENSITIVITY) - Abnormal; Notable for the following components:   Troponin I (High Sensitivity) 20 (*)    All other components within normal limits  TROPONIN I (HIGH SENSITIVITY) - Abnormal; Notable for the following components:   Troponin I (High Sensitivity) 29 (*)    All other components within normal limits  URINALYSIS, COMPLETE (UACMP) WITH MICROSCOPIC   ____________________________________________  EKG  ED ECG REPORT I, Arta Silence, the attending physician, personally viewed and interpreted this ECG.  Date:  03/06/2020 EKG Time: 0848 Rate: 93 Rhythm: normal sinus rhythm QRS Axis: normal Intervals: RBBB ST/T Wave abnormalities: Nonspecific T wave abnormality Narrative Interpretation: no evidence of acute ischemia  ED ECG REPORT I, Arta Silence, the attending physician, personally viewed and interpreted this ECG.  Date: 03/06/2020 EKG Time: 0943 Rate: 59 Rhythm: normal sinus rhythm QRS Axis: normal Intervals: RBBB ST/T Wave abnormalities: Nonspecific T wave abnormality Narrative Interpretation: no evidence of acute ischemia   ____________________________________________  RADIOLOGY  CT head: No ICH or other acute abnormality XR R elbow interpreted by me shows possible nondisplaced radial head fracture with no significant effusion  ____________________________________________   PROCEDURES  Procedure(s) performed: No  Procedures  Critical Care performed: Yes  CRITICAL CARE Performed by: Arta Silence   Total critical care time: 35 minutes  Critical care time was exclusive of separately billable procedures and treating other patients.  Critical care was necessary to treat or prevent imminent or life-threatening deterioration.  Critical care was time spent personally by me on the following activities: development of treatment plan with patient and/or surrogate as well as nursing, discussions with consultants, evaluation of patient's response to treatment, examination of patient, obtaining history from patient or surrogate, ordering and performing treatments and interventions, ordering and review of laboratory studies, ordering and review of radiographic studies, pulse oximetry and re-evaluation of patient's condition. ____________________________________________   INITIAL IMPRESSION / ASSESSMENT AND PLAN / ED COURSE  Pertinent labs & imaging results that were available during my care of the patient were reviewed by me and considered in my medical decision  making (see chart for details).  87 year old female with PMH as noted above including diastolic CHF, CKD, and hypothyroidism shingles with postherpetic neuralgia, (and no known seizure disorder) presents with seizures after an apparent fall earlier this morning, as well as possible right arm deformity.  I reviewed the past medical records in [place].  The patient  was most recently admitted last month with a CHF exacerbation.  She was discharged to SNF on 12/21.  She is DNR and has recently been evaluated by palliative care on 1/4.  On arrival to the ED, the patient is somnolent and moaning.  Her vital signs are normal except for hypertension.  she does open her eyes to command but is otherwise not able to speak or follow most commands.  She is moving all extremities.  There is no respiratory distress.  Exam is otherwise as described above.  During my initial exam, the patient began having a generalized tonic-clonic seizure.  This resolved after about 1.5 minutes and IV Ativan was given.  At this time the patient is maintaining her airway, and especially given her DNR status, intubation is not indicated.  Overall I am very concerned for intracranial hemorrhage given the new onset seizures in the context of a fall.  Differential includes electrolyte abnormality other metabolic disturbance, stroke or other primary CNS cause, or primary seizure disorder.  We will give IV Keppra, obtain a CT head and x-ray of the right elbow, lab work-up, and reassess.  ----------------------------------------- 12:20 PM on 03/06/2020 -----------------------------------------  CT head showed no ICH. The lab work-up is overall unrevealing. Lactate acid is elevated but this is consistent with seizure. There is no clinical evidence for sepsis, so I am not treating the patient for sepsis at this time. She has had no further seizure activity in the ED.  The patient is incidentally COVID-positive but has no symptoms of acute  COVID infection.  Elbow shows a possible radial head fracture. I consulted Dr. Roland Rack from orthopedics who recommends a sling and nonoperative management.  I consulted Dr. Cheral Marker from neurology to evaluate the patient for her new onset seizure.  I then discussed the patient with the hospitalist Dr. Si Raider for admission.  _________________________  Amy [name] was evaluated in Emergency Department on 03/06/2020 for the symptoms described in the history of present illness. She was evaluated in the context of the global COVID-19 pandemic, which necessitated consideration that the patient might be at risk for infection with the SARS-CoV-2 virus that causes COVID-19. Institutional protocols and algorithms that pertain to the evaluation of patients at risk for COVID-19 are in a state of rapid change based on information released by regulatory bodies including the CDC and federal and state organizations. These policies and algorithms were followed during the patient's care in the ED.  ____________________________________________   FINAL CLINICAL IMPRESSION(S) / ED DIAGNOSES  Final diagnoses:  Seizure ([place])  Closed nondisplaced fracture of head of right radius, initial encounter      NEW MEDICATIONS STARTED DURING THIS VISIT:  New Prescriptions   No medications on file     Note:  This document was prepared using Dragon voice recognition software and may include unintentional dictation errors.    Arta Silence, MD 03/06/20 1347

## 2020-03-06 NOTE — Consult Note (Signed)
Remdesivir - Pharmacy Brief Note   O:  ALT: CMP pending CXR: pending SpO2: On room air   A/P:  03/06/20 SARS-COV-2 PCR (+)  Remdesivir 200 mg IVPB once followed by 100 mg IVPB daily x 4 days.   Benita Gutter 03/06/2020 1:35 PM

## 2020-03-06 NOTE — H&P (Addendum)
History and Physical    Amy [name] GUR:[id number] DOB: 13-Aug-1927 DOA: 03/06/2020  PCP: [name], MD  Patient coming from: SNF   Chief Complaint: seizure  HPI: Amy [name] is a 87 y.o. female with medical history significant for dm, hypothyroid, CAD s/p stent, dCHF, paroxysmal a fib, ckd 4, renal artery stenosis s/p stent, anemia, and kidney stones, who presents w/ the above.  History obtained from daughter and ED staff as patient is post ictal. No known history of seizure.  At her skilled nursing facility she had a fall today with witness generalized shaking. Blood pressure afterward was elevated to low 200s and EMS was alerted.   On arrival during EDP's exam he witnessed another seizure, generalized shaking. On my exam patient is sleeping and not rousable. No report of head trauma. No report of toxic ingestions. No report of fever, chest pain, cough, nausea, vomiting, diarrhea, or dysuria.  Patient did test positive for covid on 1/6 - many cases among staff there. Per daughter she has been asymptomatic. She is vaccinated, not boosted.  ED Course:   Ativan, keppra, 500 ml fluids, ct head  Review of Systems: As per HPI otherwise 10 point review of systems negative.    Past Medical History:  Diagnosis Date  . Bladder incontinence   . CAD (coronary artery disease)    a. 05/2009 Cath: LAD 90p (Xience 2.75 x 12 mm DES), 12m, D1 40, LCx 40p/m, RCA 30/40/30p/m, RCA 30/25d;  b.  12/2011 Lexiscan MV: no ischemia, breast attenuation artifact, normal EF-->Low risk; c. 10/2016 MV: fixed apical defect, most likely apical thinning and attenuation, No ischemia, EF 60%.  . Cancer ([place])    ovarian  . Carotid arterial disease w/ R Carotid Bruit (HCC)    a. 08/2016 Carotid U/S: 40-59% bilat ICA stenosis - f/u 1 yr.  . Chronic diastolic (congestive) heart failure ([place])    a. Echo 09/2015: EF 55-60% w/ Grade 1 DD, sev Ca2+ MV annulus, mildly dil LA; b. 09/2016 Echo: EF 65-70%, Gr2  DD, mildly dil LA/RA, nl RV fxn.  . Chronic Dyspnea on exertion   . CKD (chronic kidney disease) stage 3, GFR 30-59 ml/min (HCC)   . Degenerative arthritis of knee    bilateral knees  . Diabetes mellitus    Type II  . GIB (gastrointestinal bleeding)    a. 08/2016 Admission w/ presyncope/anemia/melena-->req Transfusion-->endo ok, colonoscopy w/ polyps but no source of bleeding (most likely diverticular).  . Hiatal hernia   . Hypertension   . Iron deficiency   . Menopausal symptoms   . Morbid obesity ([place])   . Renal insufficiency   . Thyroid disease    hypothyroidism    Past Surgical History:  Procedure Laterality Date  . COLONOSCOPY  2015  . COLONOSCOPY WITH PROPOFOL N/A 09/25/2016   Procedure: COLONOSCOPY WITH PROPOFOL;  Surgeon: [name], MD;  Location: [facility] ENDOSCOPY;  Service: Gastroenterology;  Laterality: N/A;  . COLONOSCOPY WITH PROPOFOL N/A 09/26/2016   Procedure: COLONOSCOPY WITH PROPOFOL;  Surgeon: [name], MD;  Location: [facility] ENDOSCOPY;  Service: Gastroenterology;  Laterality: N/A;  . ESOPHAGOGASTRODUODENOSCOPY (EGD) WITH PROPOFOL N/A 09/25/2016   Procedure: ESOPHAGOGASTRODUODENOSCOPY (EGD) WITH PROPOFOL;  Surgeon: [name], MD;  Location: [facility] ENDOSCOPY;  Service: Gastroenterology;  Laterality: N/A;  . RENAL ANGIOGRAPHY N/A 02/07/2019   Procedure: RENAL ANGIOGRAPHY;  Surgeon: [name], MD;  Location: [facility] CV LAB;  Service: Cardiovascular;  Laterality: N/A;  . REPLACEMENT TOTAL KNEE BILATERAL    .  TOTAL VAGINAL HYSTERECTOMY     ovarian mass, not cancerous  . UPPER GI ENDOSCOPY  2015     reports that she has never smoked. She has never used smokeless tobacco. She reports that she does not drink alcohol and does not use drugs.  Allergies  Allergen Reactions  . Atenolol Other (See Comments)    Other reaction(s): Other (See Comments) Decreased heart rate Decreased heart rate  . Codeine Shortness Of Breath    Rash, difficulty breathing, nausea.  .  Losartan Other (See Comments)    Hyperkalemia  . Morphine And Related Shortness Of Breath    Rash, difficulty breathing, nausea.   . Nsaids Other (See Comments)    Other reaction(s): Unknown  . Rofecoxib     Other reaction(s): Unknown  . Sucralfate Other (See Comments)    Throat tightness  . Sulfa Antibiotics     Other reaction(s): Unknown    Family History  Problem Relation Age of Onset  . Breast cancer Mother 75    Prior to Admission medications   Medication Sig Start Date End Date Taking? Authorizing Provider  acetaZOLAMIDE (DIAMOX) 250 MG tablet Take 1 tablet (250 mg total) by mouth 2 (two) times daily for 7 days. 02/14/20 02/21/20  Enzo Bi, MD  albuterol (PROVENTIL HFA;VENTOLIN HFA) 108 (90 Base) MCG/ACT inhaler Inhale 2 puffs into the lungs every 6 (six) hours as needed for wheezing or shortness of breath.    [provider]  amitriptyline (ELAVIL) 10 MG tablet Take 10 mg by mouth at bedtime. 12/04/19   [provider]  budesonide (PULMICORT) 0.5 MG/2ML nebulizer solution Inhale 0.5 mg into the lungs daily. 03/16/19 04/04/20  [provider]  cloNIDine (CATAPRES - DOSED IN MG/24 HR) 0.2 mg/24hr patch Hold until followup with outpatient doctor due to blood pressure sometimes being low.  Was replaced with oral clonidine. Patient not taking: Reported on 02/28/2020 02/14/20   Enzo Bi, MD  cloNIDine (CATAPRES) 0.1 MG tablet Take 1 tablet (0.1 mg total) by mouth 2 (two) times daily. 02/14/20   Enzo Bi, MD  clopidogrel (PLAVIX) 75 MG tablet Take 1 tablet (75 mg total) by mouth daily. 09/27/16   Dustin Flock, MD  doxazosin (CARDURA) 8 MG tablet Take 8 mg by mouth daily. 12/20/19   [provider]  hydrochlorothiazide (HYDRODIURIL) 50 MG tablet Take 50 mg by mouth daily. 01/10/20 01/09/21  [provider]  ipratropium-albuterol (DUONEB) 0.5-2.5 (3) MG/3ML SOLN Take 3 mLs by nebulization every 6 (six) hours as needed. 02/14/20   Enzo Bi, MD   iron polysaccharides (NIFEREX) 150 MG capsule Take 1 capsule (150 mg total) by mouth daily. 02/15/20   Enzo Bi, MD  levothyroxine (SYNTHROID) 88 MCG tablet 88 mcg daily before breakfast.  06/20/19   [provider]  lisinopril (ZESTRIL) 2.5 MG tablet Take 2.5 mg by mouth daily. 01/10/20 01/09/21  [provider]  melatonin 5 MG TABS Take 1 tablet (5 mg total) by mouth at bedtime as needed (sleep). 02/14/20   Enzo Bi, MD  montelukast (SINGULAIR) 10 MG tablet Take 10 mg by mouth at bedtime.  03/24/19   [provider]  nitroGLYCERIN (NITROSTAT) 0.4 MG SL tablet Place 1 tablet (0.4 mg total) under the tongue every 5 (five) minutes x 3 doses as needed for chest pain. 08/23/19   Enzo Bi, MD  pantoprazole (PROTONIX) 40 MG tablet Take 20 mg by mouth daily.     [provider]  pregabalin (LYRICA) 50 MG capsule  Take 2 capsules (100 mg total) by mouth at bedtime. Patient taking differently: Take 50 mg by mouth 2 (two) times daily. 02/14/20   Enzo Bi, MD  simvastatin (ZOCOR) 40 MG tablet Take 1 tablet (40 mg total) by mouth at bedtime. 03/22/18   Minna Merritts, MD  SYMBICORT 160-4.5 MCG/ACT inhaler Inhale 2 puffs into the lungs 2 (two) times daily.  11/15/14   [provider]  torsemide (DEMADEX) 20 MG tablet Take 20 mg by mouth daily. 09/21/19 02/28/20  [provider]    Physical Exam: Vitals:   03/06/20 1100 03/06/20 1130 03/06/20 1230 03/06/20 1300  BP: (!) 135/45 (!) 102/45 104/75 129/81  Pulse: (!) 53 (!) 48 (!) 52 (!) 53  Resp: 17 13 15 17   Temp:      TempSrc:      SpO2: 99% 96% 98% 98%    Constitutional: No acute distress, asleep Head: Atraumatic Eyes: Conjunctiva clear ENM: Moist mucous membranes. Normal dentition.  Neck: Supple Respiratory: Clear to auscultation bilaterally, no wheezing/rales/rhonchi. Normal respiratory effort. No accessory muscle use. . Cardiovascular: bradycardic, soft systolic murmur Abdomen: Non-tender,  obese. No masses. No rebound or guarding. Positive bowel sounds. Musculoskeletal: No joint deformity upper and lower extremities. Normal ROM, no contractures. Normal muscle tone.  Skin: No rashes, lesions, or ulcers.  Extremities: No peripheral edema. Palpable peripheral pulses. Neurologic: asleep, appears to be moving all 4 extremities Psychiatric: Normal insight and judgement.   Labs on Admission: I have personally reviewed following labs and imaging studies  CBC: Recent Labs  Lab 03/06/20 0945  WBC 7.5  NEUTROABS 5.8  HGB 9.0*  HCT 28.5*  MCV 94.1  PLT 622*   Basic Metabolic Panel: Recent Labs  Lab 03/06/20 0945  NA 140  K 3.8  CL 110  CO2 17*  GLUCOSE 132*  BUN 57*  CREATININE 1.37*  CALCIUM 6.8*   GFR: CrCl cannot be calculated (Unknown ideal weight.). Liver Function Tests: No results for input(s): AST, ALT, ALKPHOS, BILITOT, PROT, ALBUMIN in the last 168 hours. No results for input(s): LIPASE, AMYLASE in the last 168 hours. No results for input(s): AMMONIA in the last 168 hours. Coagulation Profile: No results for input(s): INR, PROTIME in the last 168 hours. Cardiac Enzymes: No results for input(s): CKTOTAL, CKMB, CKMBINDEX, TROPONINI in the last 168 hours. BNP (last 3 results) No results for input(s): PROBNP in the last 8760 hours. HbA1C: No results for input(s): HGBA1C in the last 72 hours. CBG: No results for input(s): GLUCAP in the last 168 hours. Lipid Profile: No results for input(s): CHOL, HDL, LDLCALC, TRIG, CHOLHDL, LDLDIRECT in the last 72 hours. Thyroid Function Tests: No results for input(s): TSH, T4TOTAL, FREET4, T3FREE, THYROIDAB in the last 72 hours. Anemia Panel: No results for input(s): VITAMINB12, FOLATE, FERRITIN, TIBC, IRON, RETICCTPCT in the last 72 hours. Urine analysis:    Component Value Date/Time   COLORURINE YELLOW (A) 08/18/2019 0745   APPEARANCEUR CLEAR (A) 08/18/2019 0745   APPEARANCEUR Cloudy (A) 03/15/2019 0909    LABSPEC 1.014 08/18/2019 0745   LABSPEC 1.025 10/11/2013 1637   PHURINE 5.0 08/18/2019 0745   GLUCOSEU NEGATIVE 08/18/2019 0745   GLUCOSEU Negative 10/11/2013 1637   HGBUR NEGATIVE 08/18/2019 0745   BILIRUBINUR NEGATIVE 08/18/2019 0745   BILIRUBINUR Negative 03/15/2019 0909   BILIRUBINUR Negative 10/11/2013 1637   KETONESUR NEGATIVE 08/18/2019 0745   PROTEINUR NEGATIVE 08/18/2019 0745   NITRITE NEGATIVE 08/18/2019 0745   LEUKOCYTESUR NEGATIVE 08/18/2019 0745   LEUKOCYTESUR Negative 10/11/2013  [place] on Admission: DG Elbow 2 Views Right  Result Date: 03/06/2020 CLINICAL DATA:  Fall with right arm pain and possible deformity. EXAM: RIGHT ELBOW - 2 VIEW COMPARISON:  None. FINDINGS: Two views of the right elbow were obtained. Right elbow is located. Concern for small cortical step-off versus spurring at the radial neck. No other areas are concerning for fracture. No definite elbow joint effusion. Alignment of the elbow is normal. IMPRESSION: Irregularity at the radial head and neck. This finding could be related to spurring but difficult to exclude a mildly displaced fracture. There is not a definite elbow joint effusion and the findings at the radial head / neck are equivocal. If there is high clinical concern for acute injury, recommend further characterization with CT. Electronically Signed   By: Markus Daft M.D.   On: 03/06/2020 09:22   CT Head Wo Contrast  Result Date: 03/06/2020 CLINICAL DATA:  Nontraumatic seizure, fall with seizure like activity today EXAM: CT HEAD WITHOUT CONTRAST TECHNIQUE: Contiguous axial images were obtained from the base of the skull through the vertex without intravenous contrast. Sagittal and coronal MPR images reconstructed from axial data set. COMPARISON:  08/18/2019 FINDINGS: Brain: Generalized atrophy. Normal ventricular morphology. No midline shift or mass effect. Small vessel chronic ischemic changes of deep cerebral white matter. No  intracranial hemorrhage, mass lesion, evidence of acute infarction, or extra-axial fluid collection. Vascular: Atherosclerotic calcifications of internal carotid and vertebral arteries at skull base Skull: Intact.  Hyperostosis frontalis interna present. Sinuses/Orbits: Clear Other: N/A IMPRESSION: Atrophy with small vessel chronic ischemic changes of deep cerebral white matter. No acute intracranial abnormalities. Electronically Signed   By: Lavonia Dana M.D.   On: 03/06/2020 09:14    EKG: Independently reviewed. Bradycardia, rbb  Assessment/Plan Principal Problem:   Seizure ([place]) Active Problems:   Acquired hypothyroidism   Essential hypertension   Coronary artery disease involving native coronary artery of native heart without angina pectoris   Anemia   COPD with chronic bronchitis (HCC)   CKD (chronic kidney disease) stage 4, GFR 15-29 ml/min (HCC)   Renal stone   Type 2 diabetes mellitus with hyperglycemia (HCC)   Renal artery stenosis (HCC)   CAD (coronary artery disease)   Atrial fibrillation ([place])   COVID-19 virus infection   New onset seizure ([place])    # Seizure New onset, no hx of prior. CTH negative. 1 seizure at snf, another in the ED. Has received ativan and keppra loaded. Neurology consulted. LA 5.5 initially, improved to 2.4, think 2/2 seizure, no apparent s/s infection. - appreciate neurology results when ready. Likely will need further neuroimaging w/ MRI - defer antiepileptic to neurology - seizure precautions - f/u magnesium level  # Right radial head fx After fall today. xr shows possible radial head fracture and ortho thinks it's that. They've been consulted, advise non-operative approach, sling for a few weeks  # dCHF Recent hospitalization for chf exacerbation. Here appears to be euvolemic - cont home torsemide  # Covid infection Appears to be asymtpomatic. Diagnosed 1/6 per daughter - will treat w/ remdesivir - f/u CXR - airborne/contact - daily  inflammatory markers for now  # HTN # History renal artery stenosis s/p stent Here BPs soft - hold home clonidine, doxazosin, hctz, lisinopril  # paroxysmal a fib Here in sinus. Anticoagulation as outpt - tele  # Insomnia # Chronic pain - hold home nortriptyline - cont home lyrica given seizure, risk of withdrawal seizure  #  Tropinemia # CAD Very mild, 20-29, was in the hundreds recent hospitalization during chf exacerbation. No report of chest pain, no overt ischemic changes on ekg - cont plavix  # hypothyroid - cont home levo  # COPD Quiescent - dulera for home symbicort   DVT prophylaxis: lovenox Code Status: dnr  Family Communication: daughter updated telephonically  Consults called: neurology ([place])    Status is: Inpatient  Remains inpatient appropriate because:Inpatient level of care appropriate due to severity of illness   Dispo: The patient is from: SNF              Anticipated d/c is to: tbd              Anticipated d/c date is: > 3 days              Patient currently is not medically stable to d/c.        Desma Maxim MD [facility] Pager [phone]  If 7PM-7AM, please contact night-coverage [email or web address] Password [email or web address]  03/06/2020, 1:21 PM

## 2020-03-07 ENCOUNTER — Inpatient Hospital Stay: Payer: Medicare Other

## 2020-03-07 DIAGNOSIS — R569 Unspecified convulsions: Secondary | ICD-10-CM | POA: Diagnosis not present

## 2020-03-07 DIAGNOSIS — E039 Hypothyroidism, unspecified: Secondary | ICD-10-CM

## 2020-03-07 LAB — CBC WITH DIFFERENTIAL/PLATELET
Abs Immature Granulocytes: 0.03 10*3/uL (ref 0.00–0.07)
Basophils Absolute: 0 10*3/uL (ref 0.0–0.1)
Basophils Relative: 0 %
Eosinophils Absolute: 0.2 10*3/uL (ref 0.0–0.5)
Eosinophils Relative: 3 %
HCT: 25.7 % — ABNORMAL LOW (ref 36.0–46.0)
Hemoglobin: 8.4 g/dL — ABNORMAL LOW (ref 12.0–15.0)
Immature Granulocytes: 1 %
Lymphocytes Relative: 18 %
Lymphs Abs: 1.1 10*3/uL (ref 0.7–4.0)
MCH: 30.2 pg (ref 26.0–34.0)
MCHC: 32.7 g/dL (ref 30.0–36.0)
MCV: 92.4 fL (ref 80.0–100.0)
Monocytes Absolute: 0.6 10*3/uL (ref 0.1–1.0)
Monocytes Relative: 9 %
Neutro Abs: 4.4 10*3/uL (ref 1.7–7.7)
Neutrophils Relative %: 69 %
Platelets: 139 10*3/uL — ABNORMAL LOW (ref 150–400)
RBC: 2.78 MIL/uL — ABNORMAL LOW (ref 3.87–5.11)
RDW: 16 % — ABNORMAL HIGH (ref 11.5–15.5)
WBC: 6.3 10*3/uL (ref 4.0–10.5)
nRBC: 0 % (ref 0.0–0.2)

## 2020-03-07 LAB — COMPREHENSIVE METABOLIC PANEL
ALT: 13 U/L (ref 0–44)
AST: 12 U/L — ABNORMAL LOW (ref 15–41)
Albumin: 2.8 g/dL — ABNORMAL LOW (ref 3.5–5.0)
Alkaline Phosphatase: 74 U/L (ref 38–126)
Anion gap: 10 (ref 5–15)
BUN: 64 mg/dL — ABNORMAL HIGH (ref 8–23)
CO2: 24 mmol/L (ref 22–32)
Calcium: 8.4 mg/dL — ABNORMAL LOW (ref 8.9–10.3)
Chloride: 108 mmol/L (ref 98–111)
Creatinine, Ser: 1.45 mg/dL — ABNORMAL HIGH (ref 0.44–1.00)
GFR, Estimated: 34 mL/min — ABNORMAL LOW (ref >60)
Glucose, Bld: 118 mg/dL — ABNORMAL HIGH (ref 70–99)
Potassium: 4.6 mmol/L (ref 3.5–5.1)
Sodium: 142 mmol/L (ref 135–145)
Total Bilirubin: 0.5 mg/dL (ref 0.3–1.2)
Total Protein: 5.2 g/dL — ABNORMAL LOW (ref 6.5–8.1)

## 2020-03-07 LAB — D-DIMER, QUANTITATIVE: D-Dimer, Quant: 1.94 ug/mL-FEU — ABNORMAL HIGH (ref 0.00–0.50)

## 2020-03-07 LAB — C-REACTIVE PROTEIN: CRP: 3.1 mg/dL — ABNORMAL HIGH (ref <1.0)

## 2020-03-07 LAB — FERRITIN: Ferritin: 201 ng/mL (ref 11–307)

## 2020-03-07 LAB — PHOSPHORUS: Phosphorus: 4 mg/dL (ref 2.5–4.6)

## 2020-03-07 LAB — MAGNESIUM: Magnesium: 2.1 mg/dL (ref 1.7–2.4)

## 2020-03-07 MED ORDER — PREGABALIN 50 MG PO CAPS
50.0000 mg | ORAL_CAPSULE | Freq: Every morning | ORAL | Status: DC
  Administered 2020-03-08: 11:00:00 50 mg via ORAL

## 2020-03-07 MED ORDER — AMITRIPTYLINE HCL 10 MG PO TABS
10.0000 mg | ORAL_TABLET | Freq: Every day | ORAL | Status: DC
Start: 2020-03-07 — End: 2020-03-08
  Administered 2020-03-07: 10 mg via ORAL
  Filled 2020-03-07 (×2): qty 1

## 2020-03-07 MED ORDER — LORAZEPAM 2 MG/ML IJ SOLN
1.0000 mg | Freq: Once | INTRAMUSCULAR | Status: AC
  Administered 2020-03-07: 1 mg via INTRAVENOUS

## 2020-03-07 MED ORDER — MORPHINE SULFATE (PF) 2 MG/ML IV SOLN
1.0000 mg | INTRAVENOUS | Status: DC | PRN
  Administered 2020-03-07: 03:00:00 2 mg via INTRAVENOUS
  Filled 2020-03-07: qty 1

## 2020-03-07 MED ORDER — NITROGLYCERIN 0.4 MG SL SUBL: 0.4000 mg | SUBLINGUAL_TABLET | SUBLINGUAL | Status: DC | PRN

## 2020-03-07 MED ORDER — ALBUTEROL SULFATE HFA 108 (90 BASE) MCG/ACT IN AERS
2.0000 | INHALATION_SPRAY | Freq: Four times a day (QID) | RESPIRATORY_TRACT | Status: DC | PRN
  Filled 2020-03-07: qty 6.7

## 2020-03-07 MED ORDER — DOXAZOSIN MESYLATE 8 MG PO TABS
8.0000 mg | ORAL_TABLET | Freq: Every day | ORAL | Status: DC
  Administered 2020-03-07 – 2020-03-08 (×2): 8 mg via ORAL
  Filled 2020-03-07 (×2): qty 1

## 2020-03-07 MED ORDER — LORAZEPAM 2 MG/ML IJ SOLN
INTRAMUSCULAR | Status: AC
  Filled 2020-03-07: qty 1

## 2020-03-07 MED ORDER — PREGABALIN 50 MG PO CAPS
100.0000 mg | ORAL_CAPSULE | Freq: Every day | ORAL | Status: DC
  Administered 2020-03-07: 100 mg via ORAL
  Filled 2020-03-07: qty 2

## 2020-03-07 MED ORDER — MELATONIN 5 MG PO TABS
5.0000 mg | ORAL_TABLET | Freq: Every evening | ORAL | Status: DC | PRN
  Filled 2020-03-07 (×2): qty 1

## 2020-03-07 MED ORDER — PREGABALIN 50 MG PO CAPS: 50.0000 mg | ORAL_CAPSULE | ORAL | Status: DC

## 2020-03-07 MED ORDER — POLYSACCHARIDE IRON COMPLEX 150 MG PO CAPS
150.0000 mg | ORAL_CAPSULE | Freq: Every day | ORAL | Status: DC
Start: 2020-03-07 — End: 2020-03-08
  Administered 2020-03-07 – 2020-03-08 (×2): 150 mg via ORAL
  Filled 2020-03-07 (×2): qty 1

## 2020-03-07 MED ORDER — LISINOPRIL 5 MG PO TABS
2.5000 mg | ORAL_TABLET | Freq: Every day | ORAL | Status: DC
  Administered 2020-03-07 – 2020-03-08 (×2): 2.5 mg via ORAL
  Filled 2020-03-07 (×2): qty 1

## 2020-03-07 MED ORDER — CLONIDINE HCL 0.1 MG PO TABS
0.1000 mg | ORAL_TABLET | Freq: Two times a day (BID) | ORAL | Status: DC
  Administered 2020-03-08: 11:00:00 0.1 mg via ORAL
  Filled 2020-03-07 (×2): qty 1

## 2020-03-07 NOTE — Evaluation (Signed)
Physical Therapy Evaluation Patient Details Name: Amy [name] MRN: [id number] DOB: September 08, 1927 Today's Date: 03/07/2020   History of Present Illness  presented to ER secondary to fall and acute onset of "shaking-activity"; admitted for management of new-onset seizure activity.  Also noted with R radial-head fracture (recommended for conservative, non-operative management; immobilized in sling)  Clinical Impression  Patient resting in bed upon arrival to room; just received pain medication per RN.  Rates R UE pain 8-9/10; very low tolerance for mobility overall as result.  Sling repositioned for appropriate fit and support; improved pain-control noted afterwards.  Patient globally weak and deconditioned, but does participate within ability with supine LE therex.  Declines mobility progression or OOB attempts at this time due to pain in R UE; will continue to assess/progress in subsequent sessions as appropriate.  Do anticipate extensive physical assist (as patient typically utilizes SPC in R UE; will now be unable to use). Would benefit from skilled PT to address above deficits and promote optimal return to PLOF.;d recommend transition to STR upon discharge from acute hospitalization.     Follow Up Recommendations SNF;Supervision for mobility/OOB    Equipment Recommendations       Recommendations for Other Services       Precautions / Restrictions Precautions Precautions: Fall Restrictions Weight Bearing Restrictions: Yes RUE Weight Bearing: Non weight bearing      Mobility  Bed Mobility               General bed mobility comments: refused repositioning or bed mobility due to pain    Transfers                 General transfer comment: refused repositioning or bed mobility due to pain  Ambulation/Gait             General Gait Details: refused repositioning or bed mobility due to pain  Stairs            Wheelchair Mobility    Modified Rankin  (Stroke Patients Only)       Balance                                             Pertinent Vitals/Pain Pain Assessment: Faces Faces Pain Scale: Hurts whole lot Pain Location: R elbow Pain Descriptors / Indicators: Aching;Grimacing;Guarding;Discomfort Pain Intervention(s): Limited activity within patient's tolerance;Monitored during session;Premedicated before session;Repositioned    Home Living Family/patient expects to be discharged to:: Private residence Living Arrangements: Other relatives Available Help at Discharge: Family;Available PRN/intermittently Type of Home: House Home Access: Stairs to enter Entrance Stairs-Rails: Left Entrance Stairs-Number of Steps: 5 Home Layout: One level Home Equipment: Walker - 2 wheels;Walker - 4 wheels;Kasandra Knudsen - single point Additional Comments: Lives in "guest house" on granddaughter's property; family very involved, supportive with care    Prior Function Level of Independence: Independent with assistive device(s)         Comments: Mod indep for ADLs, limited household distances, "furniture grabbing" along pathways.  Utilizes WC for longer-community distances, but out of house activity limited to MD appointments only.  Denies fall history.  Family provides frequent check in and support.     Hand Dominance   Dominant Hand: Right    Extremity/Trunk Assessment   Upper Extremity Assessment Upper Extremity Assessment:  (R UE immobilized in sling, able to flex/extend fingers; L UE grossly WFL)  Lower Extremity Assessment Lower Extremity Assessment:  (grossly 4-/5 throughout bilat LEs)       Communication   Communication: No difficulties  Cognition Arousal/Alertness: Awake/alert Behavior During Therapy: Flat affect Overall Cognitive Status: Within Functional Limits for tasks assessed                                        General Comments      Exercises Other Exercises Other Exercises:  Supine bilat LE therex, 1x10, active ROM: ankle pumps, quad sets, SAQs, heel slides, hip abduct/adduct and SLR.  Mild weakness, decreased movement of L LE compared to R LE (baseline per patient, "I've always called it my 'bad leg'")   Assessment/Plan    PT Assessment Patient needs continued PT services  PT Problem List Decreased strength;Decreased activity tolerance;Decreased mobility;Cardiopulmonary status limiting activity;Obesity       PT Treatment Interventions Functional mobility training;Therapeutic activities;Therapeutic exercise;Gait training;Patient/family education;DME instruction    PT Goals (Current goals can be found in the Care Plan section)  Acute Rehab PT Goals Patient Stated Goal: To return to home PT Goal Formulation: With patient Time For Goal Achievement: 02/23/20 Potential to Achieve Goals: Fair    Frequency Min 2X/week   Barriers to discharge        Co-evaluation               AM-PAC PT "6 Clicks" Mobility  Outcome Measure Help needed turning from your back to your side while in a flat bed without using bedrails?: Total Help needed moving from lying on your back to sitting on the side of a flat bed without using bedrails?: Total Help needed moving to and from a bed to a chair (including a wheelchair)?: Total Help needed standing up from a chair using your arms (e.g., wheelchair or bedside chair)?: Total Help needed to walk in hospital room?: Total Help needed climbing 3-5 steps with a railing? : Total 6 Click Score: 6    End of Session   Activity Tolerance: Patient limited by pain Patient left: in bed;with call bell/phone within reach;with bed alarm set Nurse Communication: Mobility status PT Visit Diagnosis: Difficulty in walking, not elsewhere classified (R26.2);Muscle weakness (generalized) (M62.81);Other abnormalities of gait and mobility (R26.89)    Time: 9449-6759 PT Time Calculation (min) (ACUTE ONLY): 17 min   Charges:   PT  Evaluation $PT Eval Moderate Complexity: 1 Mod PT Treatments $Therapeutic Exercise: 8-22 mins        Watt Geiler H. Owens Shark, PT, DPT, NCS 03/07/20, 5:08 PM [phone]

## 2020-03-07 NOTE — TOC Initial Note (Signed)
Transition of Care [facility]) - Initial/Assessment Note    Patient Details  Name: Amy [name] MRN: [id number] Date of Birth: Aug 23, 1927  Transition of [facility]) CM/SW Contact:    Shelbie Hutching, RN Phone Number: 03/07/2020, 4:33 PM  Clinical Narrative:                 Patient admitted to the hospital after falling at [place] at [facility].  Patient was at [facility] for short term rehab and was scheduled to discharge home on 1/16.  Patient found to have Covid on 1/6 at [facility]. Daughter [name], would like for patient to be able to go home but if she still needs rehab she will agree for her to go back to [facility].  PT and OT consults ordered.   TOC will cont to follow.   Expected Discharge Plan: Skilled Nursing Facility Barriers to Discharge: Continued Medical Work up   Patient Goals and CMS Choice Patient states their goals for this hospitalization and ongoing recovery are:: For patient to be able to go home CMS Medicare.gov Compare Post Acute Care list provided to:: Patient Represenative (must comment) Choice offered to / list presented to : Adult Children (daughter)  Expected Discharge Plan and Services Expected Discharge Plan: [place]   Discharge Planning Services: CM Consult Post Acute Care Choice: [place] Living arrangements for the past 2 months: Single Family Home                                      Prior Living Arrangements/Services Living arrangements for the past 2 months: Single Family Home Lives with:: Self Patient language and need for interpreter reviewed:: Yes Do you feel safe going back to the place where you live?: Yes      Need for Family Participation in Patient Care: Yes (Comment) (COVID) Care giver support system in place?: Yes (comment) (daughter and son) Current home services: DME (cane and walker) Criminal Activity/Legal Involvement Pertinent to Current Situation/Hospitalization: No - Comment as  needed  Activities of Daily Living      Permission Sought/Granted Permission sought to share information with : Case Manager,Family Chief Financial Officer Permission granted to share information with : Yes, Verbal Permission Granted  Share Information with NAME: [name]  Permission granted to share info w AGENCY: [facility]  Permission granted to share info w Relationship: daughter     Emotional Assessment Appearance:: Appears stated age Attitude/Demeanor/Rapport: Engaged Affect (typically observed): Accepting Orientation: : Oriented to Self Alcohol / Substance Use: Not Applicable Psych Involvement: No (comment)  Admission diagnosis:  Shortness of breath [R06.02] Seizure ([place]) [R56.9] New onset seizure ([place]) [R56.9] Closed nondisplaced fracture of head of right radius, initial encounter [S52.124A] Patient Active Problem List   Diagnosis Date Noted  . Atrial fibrillation ([place]) 03/06/2020  . COVID-19 virus infection 03/06/2020  . Seizure ([place]) 03/06/2020  . New onset seizure ([place]) 03/06/2020  . Acute on chronic diastolic heart failure ([place]) 02/02/2020  . Moderate persistent asthma 02/02/2020  . SOB (shortness of breath) 02/02/2020  . Anemia associated with chronic renal failure 10/22/2019  . Postherpetic neuralgia 10/18/2019  . Neuropathic pain of chest 10/18/2019  . Chronic pain syndrome 10/18/2019  . NSTEMI (non-ST elevated myocardial infarction) ([place]) 08/18/2019  . Syncope and collapse 08/13/2019  . Unresponsiveness 08/12/2019  . Hyperkalemia 08/12/2019  . HTN (hypertension) 08/12/2019  . GERD (gastroesophageal reflux disease) 08/12/2019  . Hypothyroidism  08/12/2019  . CAD (coronary artery disease) 08/12/2019  . Shingles 08/12/2019  . Carotid arterial disease ([place])   . Hypotension   . Groin pain, right   . Chest pain 08/06/2019  . Renovascular hypertension 03/08/2019  . Renal artery stenosis ([place]) 01/28/2019  . Multiple renal cysts 12/29/2018  .  Renal stone 12/29/2018  . CKD (chronic kidney disease) stage 4, GFR 15-29 ml/min (HCC) 11/23/2018  . Diabetes ([place]) 06/29/2017  . CAP (community acquired pneumonia) 05/16/2017  . GI bleed 09/21/2016  . Vitamin D deficiency 11/04/2015  . Multiple lung nodules   . COPD with chronic bronchitis ([place])   . New onset atrial fibrillation ([place]) 10/04/2015  . Anemia 10/04/2015  . Acute kidney injury superimposed on CKD ([place]) 10/03/2015  . Palliative care encounter 06/05/2015  . Morbid obesity ([place]) 12/05/2014  . Angina pectoris associated with type 2 diabetes mellitus ([place]) 12/05/2014  . Type 2 diabetes mellitus with hyperglycemia ([place]) 08/22/2014  . Chronic diastolic CHF (congestive heart failure) ([place]) 09/06/2013  . Preop cardiovascular exam 06/03/2012  . Iron deficiency anemia 09/08/2011  . HLD (hyperlipidemia) 07/18/2009  . Coronary artery disease involving native coronary artery of native heart without angina pectoris 07/18/2009  . Acquired hypothyroidism 07/12/2009  . Essential hypertension 07/12/2009   PCP:  [name], MD Pharmacy:   [facility], [place] [address] Phone: [phone] Fax: [place], [place] - [address] [place] [address] Phone: [phone] Fax: [phone]  [id number] [name], [place] - [place] [address] Phone: [phone] Fax: [phone]  [place], [place] - [address] [place] [address] Phone: [phone] Fax: [phone]     Social Determinants of Health (SDOH) Interventions    Readmission Risk Interventions Readmission Risk Prevention Plan 08/23/2019 08/15/2019  Transportation Screening Complete Complete  Medication Review (RN Care Manager) Complete Complete  PCP or Specialist  appointment within 3-5 days of discharge Complete Complete  HRI or Home Care Consult Complete Complete  SW Recovery Care/Counseling Consult Complete Complete  Palliative Care Screening Not Applicable Not [place] Complete Not Applicable  Some recent data might be hidden

## 2020-03-07 NOTE — Progress Notes (Signed)
eeg done °

## 2020-03-07 NOTE — Evaluation (Signed)
Clinical/Bedside Swallow Evaluation Patient Details  Name: Amy [name] MRN: [id number] Date of Birth: [birth year]  Today's Date: 03/07/2020 Time: SLP Start Time (ACUTE ONLY): 0830 SLP Stop Time (ACUTE ONLY): 0930 SLP Time Calculation (min) (ACUTE ONLY): 60 min  Past Medical History:  Past Medical History:  Diagnosis Date  . Bladder incontinence   . CAD (coronary artery disease)    a. 05/2009 Cath: LAD 90p (Xience 2.75 x 12 mm DES), 33m, D1 40, LCx 40p/m, RCA 30/40/30p/m, RCA 30/25d;  b.  12/2011 Lexiscan MV: no ischemia, breast attenuation artifact, normal EF-->Low risk; c. 10/2016 MV: fixed apical defect, most likely apical thinning and attenuation, No ischemia, EF 60%.  . Cancer ([place])    ovarian  . Carotid arterial disease w/ R Carotid Bruit (HCC)    a. 08/2016 Carotid U/S: 40-59% bilat ICA stenosis - f/u 1 yr.  . Chronic diastolic (congestive) heart failure ([place])    a. Echo 09/2015: EF 55-60% w/ Grade 1 DD, sev Ca2+ MV annulus, mildly dil LA; b. 09/2016 Echo: EF 65-70%, Gr2 DD, mildly dil LA/RA, nl RV fxn.  . Chronic Dyspnea on exertion   . CKD (chronic kidney disease) stage 3, GFR 30-59 ml/min (HCC)   . Degenerative arthritis of knee    bilateral knees  . Diabetes mellitus    Type II  . GIB (gastrointestinal bleeding)    a. 08/2016 Admission w/ presyncope/anemia/melena-->req Transfusion-->endo ok, colonoscopy w/ polyps but no source of bleeding (most likely diverticular).  . Hiatal hernia   . Hypertension   . Iron deficiency   . Menopausal symptoms   . Morbid obesity ([place])   . Renal insufficiency   . Thyroid disease    hypothyroidism   Past Surgical History:  Past Surgical History:  Procedure Laterality Date  . COLONOSCOPY  2015  . COLONOSCOPY WITH PROPOFOL N/A 09/25/2016   Procedure: COLONOSCOPY WITH PROPOFOL;  Surgeon: [name], MD;  Location: [facility] ENDOSCOPY;  Service: Gastroenterology;  Laterality: N/A;  . COLONOSCOPY WITH PROPOFOL N/A 09/26/2016   Procedure:  COLONOSCOPY WITH PROPOFOL;  Surgeon: [name], MD;  Location: [facility] ENDOSCOPY;  Service: Gastroenterology;  Laterality: N/A;  . ESOPHAGOGASTRODUODENOSCOPY (EGD) WITH PROPOFOL N/A 09/25/2016   Procedure: ESOPHAGOGASTRODUODENOSCOPY (EGD) WITH PROPOFOL;  Surgeon: [name], MD;  Location: [facility] ENDOSCOPY;  Service: Gastroenterology;  Laterality: N/A;  . RENAL ANGIOGRAPHY N/A 02/07/2019   Procedure: RENAL ANGIOGRAPHY;  Surgeon: [name], MD;  Location: [facility] CV LAB;  Service: Cardiovascular;  Laterality: N/A;  . REPLACEMENT TOTAL KNEE BILATERAL    . TOTAL VAGINAL HYSTERECTOMY     ovarian mass, not cancerous  . UPPER GI ENDOSCOPY  2015   HPI:  Pt  is a 87 y.o. female with medical history significant for CHF, GERD, asthma, dm, hypothyroid, CAD s/p stent, dCHF, paroxysmal a fib, ckd 4, renal artery stenosis s/p stent, anemia, and kidney stones, who presents w seizure from her SNF.  She is Covid+.  She was recently admitted to [facility] on 02/02/2020 with acute hypoxic respiratory distress in the setting of acute on chronic diastolic heart failure.   Assessment / Plan / Recommendation Clinical Impression  Pt appears to present w/ grossly adequate oropharyngeal phase swallow w/ No oropharyngeal phase dysphagia noted, No neuromuscular deficits noted. Pt consumed po trials w/ No immediate, overt, clinical s/s of aspiration during po trials. Pt appears at reduced risk for aspiration following general aspiration precautions. However, pt does have challenging factors that could impact her oropharyngeal swallowing  to include Pain/discomfort(NSG aware), fatigue/weakness, and advanced age as well as current injury hampering her UE movements and self-feeding abilities. These factors can increase risk for aspiration, dysphagia as well as decreased oral intake overall. During po trials, pt consumed all consistencies w/ no overt coughing, decline in vocal quality, or change in  respiratory presentation during/post trials. Oral phase appeared grossly [facility] w/ timely bolus management, mastication, and control of bolus propulsion for A-P transfer for swallowing. Oral clearing achieved w/ all trial consistencies -- moistened, soft foods given. OM Exam appeared [facility] w/ no unilateral weakness noted. Speech Clear. Pt fed self w/ setup support. Recommend a Regular consistency diet w/ well-Cut meats, moistened foods; Thin liquids -- carefully monitor straw use, and pt should help to Hold Cup when drinking. Recommend general aspiration precautions, Pills WHOLE in Puree for safer, easier swallowing -- pt has done this in the past, and it was encourged now and for D/C to the Dtr. Education given on Pills in Puree; food consistencies and easy to eat options; general aspiration precautions to pt and Dtr. NSG to reconsult if any new needs arise. NSG updated, agreed. MD updated. Recommend Dietician f/u for support. SLP Visit Diagnosis: Dysphagia, unspecified (R13.10)    Aspiration Risk   (reduced following general precautions)    Diet Recommendation  Regular diet w/ Well-Cut Meats, gravies added to moisten; soft/moistened foods for ease of eating/mastication at meals -- conservation of energy. Thin liquids -- monitor straw use; pt should help Hold Cup when Drinking for safer swallowing. Support at meals; general aspiration and Reflux precautions  Medication Administration: Whole meds with puree (for safer swallowing)    Other  Recommendations Recommended Consults:  (Dietician f/u) Oral Care Recommendations: Oral care BID;Oral care before and after PO;Staff/trained caregiver to provide oral care (support) Other Recommendations:  (n/a)   Follow up Recommendations None      Frequency and Duration  (n/a)   (n/a)       Prognosis Prognosis for Safe Diet Advancement: Good Barriers to Reach Goals:  (deconditioning)      Swallow Study   General Date of Onset: 03/06/20 HPI: Pt  is a 87  y.o. female with medical history significant for CHF, GERD, asthma, dm, hypothyroid, CAD s/p stent, dCHF, paroxysmal a fib, ckd 4, renal artery stenosis s/p stent, anemia, and kidney stones, who presents w seizure from her SNF.  She is Covid+.  She was recently admitted to [facility] on 02/02/2020 with acute hypoxic respiratory distress in the setting of acute on chronic diastolic heart failure. Type of Study: Bedside Swallow Evaluation Previous Swallow Assessment: none Diet Prior to this Study: NPO (regular diet at SNF, home per Dtr) Temperature Spikes Noted: No (wbc 6.3) Respiratory Status: Room air History of Recent Intubation: No Behavior/Cognition: Alert;Cooperative;Pleasant mood;Distractible;Requires cueing Oral Cavity Assessment: Within Functional Limits Oral Care Completed by SLP: Recent completion by staff Oral Cavity - Dentition: Adequate natural dentition Vision: Functional for self-feeding Self-Feeding Abilities: Able to feed self;Needs assist;Needs set up;Total assist (generalized weakness) Patient Positioning: Upright in bed (needed full support positioning Upright) Baseline Vocal Quality: Normal;Low vocal intensity Volitional Cough: Strong Volitional Swallow: Able to elicit    Oral/Motor/Sensory Function Overall Oral Motor/Sensory Function: Within functional limits   Ice Chips Ice chips: Within functional limits Presentation: Spoon (fed; 2)   Thin Liquid Thin Liquid: Within functional limits Presentation: Cup;Self Fed;Straw (4 trials via each w/ full support d/t UE weakness) Other Comments: pt is distracted d/t pain; eyes closed often  Nectar Thick Nectar Thick Liquid: Not tested   Honey Thick Honey Thick Liquid: Not tested   Puree Puree: Within functional limits Presentation: Spoon (fed; 4 trials)   Solid     Solid: Within functional limits (softened, moist) Presentation: Spoon (fed; 3 trials)          Orinda Kenner, MS,  CCC-SLP Speech Language Pathologist Rehab Services [phone] [facility] 03/07/2020,11:24 AM

## 2020-03-07 NOTE — Progress Notes (Signed)
Pt sleeping soundly after morphine dose.

## 2020-03-07 NOTE — Progress Notes (Signed)
Admitted patient from the ED, transported via stretcher. Pt is awake, oriented to person, crying/tearful complaining of pain 10/10 on right arm. Rt arm immobilized, sling applied and right arm supported with pillows. + radial pulse on right arm with good neurovascular  Checks. On seizure precaution. Call bell placed within reach. Bed alarm activated. MD on call notified of patients pain. Will monitor.

## 2020-03-07 NOTE — Progress Notes (Signed)
[place] at [place] NAME: Gavyn Zoss    MR#:  [id number]  DATE OF BIRTH:  1927-08-26  SUBJECTIVE:   Patient came in with multiple seizures after she had a fall at the skilled nursing facility with generalized weakness. She was noted to have seizures in the emergency room as well.  Water present during my evaluation in the morning. Patient woke up drank few steps [place] without difficulty earlier. Complains of right elbow pain. Patient has right elbow splint.  No more seizures reported. REVIEW OF SYSTEMS:   Review of Systems  Constitutional: Positive for malaise/fatigue. Negative for chills, fever and weight loss.  HENT: Negative for ear discharge, ear pain and nosebleeds.   Eyes: Negative for blurred vision, pain and discharge.  Respiratory: Negative for sputum production, shortness of breath, wheezing and stridor.   Cardiovascular: Negative for chest pain, palpitations, orthopnea and PND.  Gastrointestinal: Negative for abdominal pain, diarrhea, nausea and vomiting.  Genitourinary: Negative for frequency and urgency.  Musculoskeletal: Negative for back pain and joint pain.  Neurological: Positive for weakness. Negative for sensory change, speech change and focal weakness.  Psychiatric/Behavioral: Negative for depression and hallucinations. The patient is not nervous/anxious.    Tolerating Diet:yes  Tolerating PT: pending  DRUG ALLERGIES:   Allergies  Allergen Reactions  . Atenolol Other (See Comments)    Other reaction(s): Other (See Comments) Decreased heart rate Decreased heart rate  . Codeine Shortness Of Breath    Rash, difficulty breathing, nausea.  . Losartan Other (See Comments)    Hyperkalemia  . Morphine And Related Shortness Of Breath    Rash, difficulty breathing, nausea.   . Nsaids Other (See Comments)    Other reaction(s): Unknown  . Rofecoxib     Other reaction(s): Unknown  . Sucralfate Other (See  Comments)    Throat tightness  . Sulfa Antibiotics     Other reaction(s): Unknown    VITALS:  Blood pressure (!) 148/44, pulse (!) 54, temperature 97.9 F (36.6 C), resp. rate 16, height 5\' 2"  (1.575 m), weight 85 kg, SpO2 98 %.  PHYSICAL EXAMINATION:   Physical Exam  GENERAL:  87 y.o.-year-old patient lying in the bed with no acute distress. Weak, deconditioned HEENT: Head atraumatic, normocephalic. Oropharynx and nasopharynx clear.  LUNGS: Normal breath sounds bilaterally, no wheezing, rales, rhonchi. No use of accessory muscles of respiration.  CARDIOVASCULAR: S1, S2 normal. No murmurs, rubs, or gallops.  ABDOMEN: Soft, nontender, nondistended. Bowel sounds present. No organomegaly or mass.  EXTREMITIES: No cyanosis, clubbing or edema b/l.  Right elbow splint+  NEUROLOGIC: Cranial nerves II through XII are intact. No focal Motor or sensory deficits b/l. Most all extremities well. PSYCHIATRIC:  patient is alert and awake. Pleasantly confused. SKIN: No obvious rash, lesion, or ulcer.   LABORATORY PANEL:  CBC Recent Labs  Lab 03/07/20 0519  WBC 6.3  HGB 8.4*  HCT 25.7*  PLT 139*    Chemistries  Recent Labs  Lab 03/07/20 0519  NA 142  K 4.6  CL 108  CO2 24  GLUCOSE 118*  BUN 64*  CREATININE 1.45*  CALCIUM 8.4*  MG 2.1  AST 12*  ALT 13  ALKPHOS 74  BILITOT 0.5   Cardiac Enzymes No results for input(s): TROPONINI in the last 168 hours. RADIOLOGY:  DG Elbow 2 Views Right  Result Date: 03/06/2020 CLINICAL DATA:  Fall with right arm pain and possible deformity. EXAM: RIGHT ELBOW - 2 VIEW COMPARISON:  None. FINDINGS: Two views of the right elbow were obtained. Right elbow is located. Concern for small cortical step-off versus spurring at the radial neck. No other areas are concerning for fracture. No definite elbow joint effusion. Alignment of the elbow is normal. IMPRESSION: Irregularity at the radial head and neck. This finding could be related to spurring but  difficult to exclude a mildly displaced fracture. There is not a definite elbow joint effusion and the findings at the radial head / neck are equivocal. If there is high clinical concern for acute injury, recommend further characterization with CT. Electronically Signed   By: Markus Daft M.D.   On: 03/06/2020 09:22   CT Head Wo Contrast  Result Date: 03/06/2020 CLINICAL DATA:  Nontraumatic seizure, fall with seizure like activity today EXAM: CT HEAD WITHOUT CONTRAST TECHNIQUE: Contiguous axial images were obtained from the base of the skull through the vertex without intravenous contrast. Sagittal and coronal MPR images reconstructed from axial data set. COMPARISON:  08/18/2019 FINDINGS: Brain: Generalized atrophy. Normal ventricular morphology. No midline shift or mass effect. Small vessel chronic ischemic changes of deep cerebral white matter. No intracranial hemorrhage, mass lesion, evidence of acute infarction, or extra-axial fluid collection. Vascular: Atherosclerotic calcifications of internal carotid and vertebral arteries at skull base Skull: Intact.  Hyperostosis frontalis interna present. Sinuses/Orbits: Clear Other: N/A IMPRESSION: Atrophy with small vessel chronic ischemic changes of deep cerebral white matter. No acute intracranial abnormalities. Electronically Signed   By: Lavonia Dana M.D.   On: 03/06/2020 09:14   Portable chest 1 View  Result Date: 03/06/2020 CLINICAL DATA:  Fall. EXAM: PORTABLE CHEST 1 VIEW COMPARISON:  02/08/2020. FINDINGS: Cardiomegaly again noted. Mild bilateral interstitial prominence again noted, improved from prior exam. Stable calcified densities left lung base. Interval near complete resolution of left pleural effusion. Interval resolution of right pleural effusion. No pneumothorax. Degenerative changes scoliosis thoracic spine. No acute bony abnormality. IMPRESSION: 1. Cardiomegaly again noted. Mild bilateral interstitial prominence again noted, improved from prior  exam. 2. Interval near complete resolution of left pleural effusion. Interval resolution of right pleural effusion. Electronically Signed   By: Marcello Moores  Register   On: 03/06/2020 13:47   ASSESSMENT AND PLAN:  ANABELA CRAYTON is a 87 y.o. female with medical history significant for dm, hypothyroid, CAD s/p stent, dCHF, paroxysmal a fib, ckd 4, renal artery stenosis s/p stent, anemia, and kidney stones, who presents after she had a fall today with witness generalized shaking at her rehab place.  Patient did test positive for covid on 1/6 - many cases among staff there. Per daughter she has been asymptomatic. She is vaccinated, not boosted.   # Seizure-New onset, no hx of prior. - CTH negative. 1 seizure at snf, another in the ED. Has received ativan and keppra loaded. -Neurology consulted--recommends EEG, MRI brian and cont keprra - LA 5.5 initially, improved to 2.4, think 2/2 seizure, no apparent s/s infection. - seizure precautions - f/u magnesium level  Is 2.1  # Right radial head fx s/p fall at Rehab -xr shows possible radial head fracture  -Ortho Dr Roland Rack  Consulted--advise non-operative approach, sling for a few weeks  # dCHF -Recent hospitalization for chf exacerbation. Here appears to be euvolemic - cont home torsemide  # Covid infection Appears to be asymtpomatic. Diagnosed 1/6 per daughter - will treat w/ remdesivir - f/u CXR - airborne/contact - daily inflammatory markers for now  # HTN # History renal artery stenosis s/p stent -- will resume home clonidine,  doxazosin, lisinopril  # paroxysmal a fib Here in sinus.  # Insomnia # Chronic pain - resunme nortriptyline - cont home lyrica given seizure, risk of withdrawal seizure  # CAD - cont plavix  # hypothyroid - cont home levo  # COPD Quiescent - dulera for home symbicort   DVT prophylaxis: lovenox Code Status: dnr  Family Communication: daughter updated in the room Consults called:  neurology ([place]) Ortho dr Roland Rack   Status is: Inpatient  Remains inpatient appropriate because:Inpatient level of care appropriate due to severity of illness   Dispo: The patient is from: SNF  Anticipated d/c is to: tbd  Anticipated d/c date is: 2- 3 days  Patient currently is not medically stable to d/c.       TOTAL TIME TAKING CARE OF THIS PATIENT: *25* minutes.  >50% time spent on counselling and coordination of care  Note: This dictation was prepared with Dragon dictation along with smaller phrase technology. Any transcriptional errors that result from this process are unintentional.  Fritzi Mandes M.D    [facility]   CC: Primary care physician; [name], MDPatient ID: [name], female   DOB: [birth year], 87 y.o.   MRN: [id number]

## 2020-03-07 NOTE — Progress Notes (Addendum)
[facility]) Hospital Liaison RN note:  This patient is enrolled in our palliative services in the community. [facility] Liaison will follow for any discharge planning needs and to coordinate continuation of palliative care. Doran Clay, TOC is aware.  Thank you.   Zandra Abts, RN [facility] Liaison [phone]

## 2020-03-07 NOTE — Plan of Care (Signed)

## 2020-03-07 NOTE — Procedures (Addendum)
Patient Name: Amy [name]  MRN: [id number]  Epilepsy Attending: Lora Havens  Referring Physician/Provider: Dr. Kerney Elbe Date: 03/07/2020 Duration: 28.05 mins  Patient history: 87 year old female presented with new onset seizure-like activity.  EEG to evaluate for seizures.  Level of alertness:lethargic  AEDs during EEG study: Keppra, pregabalin  Technical aspects: This EEG study was done with scalp electrodes positioned according to the 10-20 International system of electrode placement. Electrical activity was acquired at a sampling rate of 500Hz  and reviewed with a high frequency filter of 70Hz  and a low frequency filter of 1Hz . EEG data were recorded continuously and digitally stored.   Description: No posterior dominant rhythm was seen.  EEG showed continuous generalized 5 to 7 Hz theta slowing as well as intermittent generalized 2 to 3 Hz delta slowing major signs appeared sharply contoured with triphasic morphology.  Hyperventilation and photic stimulation were not performed.     ABNORMALITY -Continuous slow, generalized  IMPRESSION: This study is suggestive of moderate diffuse encephalopathy, nonspecific etiology. No seizures or definite epileptiform discharges were seen throughout the recording.  Gerri Acre Barbra Sarks

## 2020-03-08 ENCOUNTER — Inpatient Hospital Stay: Payer: Medicare Other

## 2020-03-08 DIAGNOSIS — I2510 Atherosclerotic heart disease of native coronary artery without angina pectoris: Secondary | ICD-10-CM | POA: Diagnosis not present

## 2020-03-08 DIAGNOSIS — I480 Paroxysmal atrial fibrillation: Secondary | ICD-10-CM

## 2020-03-08 DIAGNOSIS — S52124A Nondisplaced fracture of head of right radius, initial encounter for closed fracture: Secondary | ICD-10-CM | POA: Diagnosis not present

## 2020-03-08 DIAGNOSIS — U071 COVID-19: Secondary | ICD-10-CM

## 2020-03-08 DIAGNOSIS — E039 Hypothyroidism, unspecified: Secondary | ICD-10-CM | POA: Diagnosis not present

## 2020-03-08 LAB — PHOSPHORUS: Phosphorus: 4.9 mg/dL — ABNORMAL HIGH (ref 2.5–4.6)

## 2020-03-08 LAB — FIBRIN DERIVATIVES D-DIMER (ARMC ONLY): Fibrin derivatives D-dimer (ARMC): 1632.49 ng/mL (FEU) — ABNORMAL HIGH (ref 0.00–499.00)

## 2020-03-08 LAB — C-REACTIVE PROTEIN: CRP: 9.9 mg/dL — ABNORMAL HIGH (ref <1.0)

## 2020-03-08 MED ORDER — LEVETIRACETAM 500 MG PO TABS: 500.0000 mg | ORAL_TABLET | Freq: Two times a day (BID) | ORAL | 3 refills | Status: DC

## 2020-03-08 MED ORDER — POLYETHYLENE GLYCOL 3350 17 G PO PACK
17.0000 g | PACK | Freq: Every day | ORAL | Status: DC
  Administered 2020-03-08: 11:00:00 17 g via ORAL
  Filled 2020-03-08: qty 1

## 2020-03-08 MED ORDER — LEVETIRACETAM 500 MG PO TABS
500.0000 mg | ORAL_TABLET | Freq: Two times a day (BID) | ORAL | Status: DC
  Administered 2020-03-08: 11:00:00 500 mg via ORAL
  Filled 2020-03-08 (×2): qty 1

## 2020-03-08 MED ORDER — SODIUM CHLORIDE 0.9 % IV SOLN
100.0000 mg | Freq: Once | INTRAVENOUS | Status: AC
  Administered 2020-03-08: 100 mg via INTRAVENOUS
  Filled 2020-03-08: qty 20

## 2020-03-08 MED ORDER — POLYETHYLENE GLYCOL 3350 17 G PO PACK: 17.0000 g | PACK | Freq: Every day | ORAL | 0 refills | Status: DC

## 2020-03-08 MED ORDER — DOCUSATE SODIUM 100 MG PO CAPS
100.0000 mg | ORAL_CAPSULE | Freq: Two times a day (BID) | ORAL | Status: DC
  Administered 2020-03-08: 100 mg via ORAL
  Filled 2020-03-08: qty 1

## 2020-03-08 MED ORDER — DOCUSATE SODIUM 100 MG PO CAPS: 100.0000 mg | ORAL_CAPSULE | Freq: Two times a day (BID) | ORAL | 0 refills | Status: DC

## 2020-03-08 NOTE — Evaluation (Signed)
Occupational Therapy Evaluation Patient Details Name: Amy [name] MRN: [id number] DOB: Nov 28, 1927 Today's Date: 03/08/2020    History of Present Illness presented to ER secondary to fall and acute onset of "shaking-activity"; admitted for management of new-onset seizure activity.  Also noted with R radial-head fracture (recommended for conservative, non-operative management; immobilized in sling)   Clinical Impression   Pt seen for OT evaluation this date in setting of acute hospitalization d/t fall. Pt was more INDEP with self care and ADLs prior to previous hospitalization and presented on this occasion from SNF for rehab. Pt is relatively drowsy throughout. OT facilitates ed with pt re: importance of OOB Activity and pt with very minimal reception/attention. OT engages pt in bed level UB grooming tasks including oral care with MOD A with tactile cueing to initiate and complete. In addition, pt's gown wet and OT facilitates pt participation in donning clean one with MOD A. Bed positioned in high folwer's for these tasks. Pt unsafe to bring to EOB sitting at this time d/t very limited attn/wakefullness and c/o pain/resistant to OT's attempts to engage her in mobilization, even with TOTAL A. OT does facilitate in-bed repositioning with TOTAL A, attempts to get pt to engage with LEs to no avail. Will continue to follow acutely, but anticipate pt will require ongoing STR at SNF to reach highest attainable level of performance and safety with self care.    Follow Up Recommendations  SNF;Supervision/Assistance - 24 hour    Equipment Recommendations  Other (comment) (defer)    Recommendations for Other Services       Precautions / Restrictions Precautions Precautions: Fall Restrictions Weight Bearing Restrictions: Yes RUE Weight Bearing: Non weight bearing      Mobility Bed Mobility Overal bed mobility: Needs Assistance             General bed mobility comments: Pt nods and  says "yes" when asked if she would like to get OOB, but when OT attempts to get her to open eyes and attend to sequence, pt with very limited cue following that would prove that sup to sit transition is safe at this time. Pt states "I'm awake, I'm awake" But continues to primarily keep her eyes closed.    Transfers                 General transfer comment: deferred for safety, also c/o pain with attempts to engage her in mobilization    Balance       Sitting balance - Comments: NT       Standing balance comment: NT                           ADL either performed or assessed with clinical judgement   ADL Overall ADL's : Needs assistance/impaired                                       General ADL Comments: MOD A for most bed-level UB ADLs and MAX/TOTAL A for bed level LB ADLs.     Vision Patient Visual Report: No change from baseline Additional Comments: difficult to formally assess as pt with limited eye opening/attn to task throughout OT session, but when she does attend, appears to track appropriately.     Perception     Praxis      Pertinent Vitals/Pain Pain Assessment: Faces Faces Pain  Scale: Hurts whole lot Pain Location: R elbow Pain Descriptors / Indicators: Aching;Grimacing;Guarding;Discomfort Pain Intervention(s): Limited activity within patient's tolerance;Monitored during session;Repositioned (supported on pillow with sling)     Hand Dominance Right   Extremity/Trunk Assessment Upper Extremity Assessment Upper Extremity Assessment: RUE deficits/detail;LUE deficits/detail RUE Deficits / Details: immobilized in sling, able to flex/ext digits, grossly weak LUE Deficits / Details: shld ROM somewhat limited, but remainder of joints WFL. MMT grossly 4-/5 wrist, elbow, hand   Lower Extremity Assessment Lower Extremity Assessment: Defer to PT evaluation;Generalized weakness       Communication Communication Communication: No  difficulties   Cognition Arousal/Alertness: Lethargic Behavior During Therapy: Flat affect Overall Cognitive Status: Difficult to assess                                 General Comments: follows ~25-30% of simple one step commands with increased processing time.   General Comments       Exercises Other Exercises Other Exercises: OT facilitates ed with pt re: importance of OOB Activity and pt with very minimal reception/attention. OT engages pt in bed level UB grooming tasks including oral care with MOD A with tactile cueing to initiate and complete. In addition, pt's gown wet and OT facilitates pt participation in donning clean one with MOD A. Bed positioned in high folwer's for these tasks. Pt unsafe to bring to EOB sitting at this time d/t very limited attn/wakefullness and c/o pain/resistant to OT's attempts to engage her in mobilization, even with TOTAL A. OT does facilitate in-bed repositioning with TOTAL A, attempts to get pt to engage with LEs to no avail.   Shoulder Instructions      Home Living Family/patient expects to be discharged to:: Private residence Living Arrangements: Other relatives Available Help at Discharge: Family;Available PRN/intermittently Type of Home: House Home Access: Stairs to enter Entrance Stairs-Number of Steps: 5 Entrance Stairs-Rails: Left Home Layout: One level     Bathroom Shower/Tub: Walk-in shower         Home Equipment: Environmental consultant - 2 wheels;Walker - 4 wheels;Kasandra Knudsen - single point   Additional Comments: Lives in "guest house" on granddaughter's property; family very involved, supportive with care      Prior Functioning/Environment Level of Independence: Independent with assistive device(s)        Comments: Mod indep for ADLs, limited household distances, "furniture grabbing" along pathways.  Utilizes WC for longer-community distances, but out of house activity limited to MD appointments only.  Denies fall history.  Family  provides frequent check in and support.        OT Problem List: Decreased strength;Decreased activity tolerance;Decreased safety awareness;Impaired balance (sitting and/or standing);Decreased knowledge of use of DME or AE;Decreased knowledge of precautions;Decreased cognition      OT Treatment/Interventions: Self-care/ADL training;Therapeutic exercise;Therapeutic activities;Energy conservation;Cognitive remediation/compensation;DME and/or AE instruction;Patient/family education;Balance training;Manual therapy    OT Goals(Current goals can be found in the care plan section) Acute Rehab OT Goals Patient Stated Goal: none stated OT Goal Formulation: Patient unable to participate in goal setting Time For Goal Achievement: 03/22/20 Potential to Achieve Goals: Fair ADL Goals Pt Will Perform Grooming: with min guard assist;with set-up;sitting Pt Will Perform Upper Body Dressing: with min assist;sitting (using modified technique) Pt Will Transfer to Toilet: with mod assist;stand pivot transfer;bedside commode (with LRAD (platform RW?))  OT Frequency: Min 1X/week   Barriers to D/C:  Co-evaluation              AM-PAC OT "6 Clicks" Daily Activity     Outcome Measure Help from another person eating meals?: A Little Help from another person taking care of personal grooming?: A Lot Help from another person toileting, which includes using toliet, bedpan, or urinal?: Total Help from another person bathing (including washing, rinsing, drying)?: Total Help from another person to put on and taking off regular upper body clothing?: A Lot Help from another person to put on and taking off regular lower body clothing?: Total 6 Click Score: 10   End of Session Nurse Communication: Mobility status;Other (comment) (notified RN on the floor (pt's RN not available d/t rounds) that pt's IV in L forearm is leaking and pt's bedding/gown wet-OT Addressed the bedding and gown while the floor RN  addressed the IV and stopped it.)  Activity Tolerance: Patient limited by fatigue;Patient limited by lethargy;Patient limited by pain Patient left: with call bell/phone within reach;in bed;with bed alarm set  OT Visit Diagnosis: Muscle weakness (generalized) (M62.81);Unsteadiness on feet (R26.81)                Time: 4097-3532 OT Time Calculation (min): 31 min Charges:  OT General Charges $OT Visit: 1 Visit OT Evaluation $OT Eval Moderate Complexity: 1 Mod OT Treatments $Self Care/Home Management : 8-22 mins $Therapeutic Activity: 8-22 mins  Gerrianne Scale, MS, OTR/L ascom [phone] 03/08/20, 12:13 PM

## 2020-03-08 NOTE — H&P (Shared)
Report called to RN, [place] with [facility]. All questions addressed at time of report.

## 2020-03-08 NOTE — Discharge Instructions (Signed)
Recommended diet at D/C:  Regular diet w/ Well-Cut Meats, gravies added to moisten; soft/moistened foods for ease of eating/mastication at meals -- conservation of energy. Thin liquids -- monitor straw use; pt should help Hold Cup when Drinking for safer swallowing. Support at meals; general aspiration and Reflux precautions. Pills in Puree.

## 2020-03-08 NOTE — Discharge Summary (Addendum)
[facility] NAME: Amy [name]    MR#:  [id number]  DATE OF BIRTH:  03-23-1927  DATE OF ADMISSION:  03/06/2020 ADMITTING PHYSICIAN: Gwynne Edinger, MD  DATE OF DISCHARGE: 03/08/2020  PRIMARY CARE PHYSICIAN: [name], MD    ADMISSION DIAGNOSIS:  Shortness of breath [R06.02] Seizure ([place]) [R56.9] New onset seizure ([place]) [R56.9] Closed nondisplaced fracture of head of right radius, initial encounter [S52.124A]  DISCHARGE DIAGNOSIS:  Seizure--New onset, unclear etiology COVID infection (known)  SECONDARY DIAGNOSIS:   Past Medical History:  Diagnosis Date  . Bladder incontinence   . CAD (coronary artery disease)    a. 05/2009 Cath: LAD 90p (Xience 2.75 x 12 mm DES), 53m, D1 40, LCx 40p/m, RCA 30/40/30p/m, RCA 30/25d;  b.  12/2011 Lexiscan MV: no ischemia, breast attenuation artifact, normal EF-->Low risk; c. 10/2016 MV: fixed apical defect, most likely apical thinning and attenuation, No ischemia, EF 60%.  . Cancer ([place])    ovarian  . Carotid arterial disease w/ R Carotid Bruit (HCC)    a. 08/2016 Carotid U/S: 40-59% bilat ICA stenosis - f/u 1 yr.  . Chronic diastolic (congestive) heart failure ([place])    a. Echo 09/2015: EF 55-60% w/ Grade 1 DD, sev Ca2+ MV annulus, mildly dil LA; b. 09/2016 Echo: EF 65-70%, Gr2 DD, mildly dil LA/RA, nl RV fxn.  . Chronic Dyspnea on exertion   . CKD (chronic kidney disease) stage 3, GFR 30-59 ml/min (HCC)   . Degenerative arthritis of knee    bilateral knees  . Diabetes mellitus    Type II  . GIB (gastrointestinal bleeding)    a. 08/2016 Admission w/ presyncope/anemia/melena-->req Transfusion-->endo ok, colonoscopy w/ polyps but no source of bleeding (most likely diverticular).  . Hiatal hernia   . Hypertension   . Iron deficiency   . Menopausal symptoms   . Morbid obesity ([place])   . Renal insufficiency   . Thyroid disease    hypothyroidism    HOSPITAL COURSE:   Irmgard C  Trollingeris a 87 y.o.femalewith medical history significant fordm, hypothyroid, CAD s/p stent, dCHF, paroxysmal a fib, ckd 4, renal artery stenosis s/p stent, anemia, and kidney stones, who presents after she had a fall today with witness generalized shaking at her rehab place.  Patient did test positive for covid on 1/6 - many cases among staff there. Per daughter she has been asymptomatic. She is vaccinated, not boosted.   # Seizure-New onset, no hx of prior. - CTH negative. 1 seizure at snf, another in the ED. Has received ativan and keppra loaded. -Neurology consulted--recommends EEG, MRI brian and cont keprra - LA 5.5 initially, improved to 2.4, think 2/2 seizure, no apparent s/s infection. - seizure precautions - f/u magnesium level is 2.1 -MRI brain neg for acute CVA --EEG no epileptiform activity noted --no further episodes of seizures in house -no further w/u per Neuro rec today  # Rightradial head fx s/p fall at Rehab -xr shows possible radial head fracture -Ortho Dr Roland Rack  Consulted--advise non-operative approach, sling for a few weeks--f/u out pt in 2-3 weeks--dter informed  # dCHF -Recent hospitalization for chf exacerbation and appears to be euvolemic - cont home torsemide  # Covid infection Appears to be asymtpomatic. Diagnosed 03/01/20 per daughter - will treat w/ remdesivir 3 doses - airborne/contact -sats >92% on RA  # HTN # History renal artery stenosis s/p stent -- will resume home clonidine, doxazosin, lisinopril -d/c HCTZ  #  paroxysmal a fib In NSR  # Insomnia # Chronic pain - resunme nortriptyline - cont home lyrica   # CAD - cont plavix  # hypothyroid - cont home levothyroxine  # COPD Quiescent - dulera for home symbicort   DVT prophylaxis:lovenox Code Status:dnr Family Communication:daughter updated in the room today Consults called:neurology ([place])Ortho dr poggi overall improving. D/c to [facility]  today  CONSULTS OBTAINED:  Treatment Team:  Corky Mull, MD  DRUG ALLERGIES:   Allergies  Allergen Reactions  . Atenolol Other (See Comments)    Other reaction(s): Other (See Comments) Decreased heart rate Decreased heart rate  . Codeine Shortness Of Breath    Rash, difficulty breathing, nausea.  . Losartan Other (See Comments)    Hyperkalemia  . Morphine And Related Shortness Of Breath    Rash, difficulty breathing, nausea.   . Nsaids Other (See Comments)    Other reaction(s): Unknown  . Rofecoxib     Other reaction(s): Unknown  . Sucralfate Other (See Comments)    Throat tightness  . Sulfa Antibiotics     Other reaction(s): Unknown    DISCHARGE MEDICATIONS:   Allergies as of 03/08/2020      Reactions   Atenolol Other (See Comments)   Other reaction(s): Other (See Comments) Decreased heart rate Decreased heart rate   Codeine Shortness Of Breath   Rash, difficulty breathing, nausea.   Losartan Other (See Comments)   Hyperkalemia   Morphine And Related Shortness Of Breath   Rash, difficulty breathing, nausea.   Nsaids Other (See Comments)   Other reaction(s): Unknown   Rofecoxib    Other reaction(s): Unknown   Sucralfate Other (See Comments)   Throat tightness   Sulfa Antibiotics    Other reaction(s): Unknown      Medication List    STOP taking these medications   dexamethasone 4 MG tablet Commonly known as: DECADRON   hydrochlorothiazide 50 MG tablet Commonly known as: HYDRODIURIL     TAKE these medications   acetaminophen 500 MG tablet Commonly known as: TYLENOL Take 1,000 mg by mouth 3 (three) times daily as needed for mild pain or moderate pain.   albuterol 108 (90 Base) MCG/ACT inhaler Commonly known as: VENTOLIN HFA Inhale 2 puffs into the lungs every 6 (six) hours as needed for wheezing or shortness of breath.   amitriptyline 10 MG tablet Commonly known as: ELAVIL Take 10 mg by mouth at bedtime.   cloNIDine 0.1 MG tablet Commonly  known as: CATAPRES Take 1 tablet (0.1 mg total) by mouth 2 (two) times daily.   clopidogrel 75 MG tablet Commonly known as: PLAVIX Take 1 tablet (75 mg total) by mouth daily.   docusate sodium 100 MG capsule Commonly known as: COLACE Take 1 capsule (100 mg total) by mouth 2 (two) times daily.   doxazosin 8 MG tablet Commonly known as: CARDURA Take 8 mg by mouth daily.   iron polysaccharides 150 MG capsule Commonly known as: NIFEREX Take 1 capsule (150 mg total) by mouth daily.   levETIRAcetam 500 MG tablet Commonly known as: KEPPRA Take 1 tablet (500 mg total) by mouth 2 (two) times daily.   levothyroxine 88 MCG tablet Commonly known as: SYNTHROID 88 mcg daily before breakfast.   lisinopril 2.5 MG tablet Commonly known as: ZESTRIL Take 2.5 mg by mouth daily.   melatonin 5 MG Tabs Take 1 tablet (5 mg total) by mouth at bedtime as needed (sleep).   montelukast 10 MG tablet Commonly known as: SINGULAIR Take  10 mg by mouth at bedtime.   nitroGLYCERIN 0.4 MG SL tablet Commonly known as: NITROSTAT Place 1 tablet (0.4 mg total) under the tongue every 5 (five) minutes x 3 doses as needed for chest pain.   pantoprazole 20 MG tablet Commonly known as: PROTONIX Take 20 mg by mouth daily.   polyethylene glycol 17 g packet Commonly known as: MIRALAX / GLYCOLAX Take 17 g by mouth daily.   pregabalin 50 MG capsule Commonly known as: LYRICA Take 50-100 mg by mouth See admin instructions. Take 1 capsule (50mg ) by mouth every morning and take 2 capsules (100mg ) by mouth every night at bedtime   simvastatin 40 MG tablet Commonly known as: ZOCOR Take 1 tablet (40 mg total) by mouth at bedtime.   Symbicort 160-4.5 MCG/ACT inhaler Generic drug: budesonide-formoterol Inhale 2 puffs into the lungs 2 (two) times daily.   torsemide 20 MG tablet Commonly known as: DEMADEX Take 20 mg by mouth daily.       If you experience worsening of your admission symptoms, develop  shortness of breath, life threatening emergency, suicidal or homicidal thoughts you must seek medical attention immediately by calling 911 or calling your MD immediately  if symptoms less severe.  You Must read complete instructions/literature along with all the possible adverse reactions/side effects for all the Medicines you take and that have been prescribed to you. Take any new Medicines after you have completely understood and accept all the possible adverse reactions/side effects.   Please note  You were cared for by a hospitalist during your hospital stay. If you have any questions about your discharge medications or the care you received while you were in the hospital after you are discharged, you can call the unit and asked to speak with the hospitalist on call if the hospitalist that took care of you is not available. Once you are discharged, your primary care physician will handle any further medical issues. Please note that NO REFILLS for any discharge medications will be authorized once you are discharged, as it is imperative that you return to your primary care physician (or establish a relationship with a primary care physician if you do not have one) for your aftercare needs so that they can reassess your need for medications and monitor your lab values. Today   SUBJECTIVE  Trying to feed herself BF this morning No seizures inhouse dter [name] in the room  VITAL SIGNS:  Blood pressure (!) 114/37, pulse 62, temperature 98.8 F (37.1 C), resp. rate 16, height 5\' 2"  (1.575 m), weight 85 kg, SpO2 97 %.  I/O:    Intake/Output Summary (Last 24 hours) at 03/08/2020 1053 Last data filed at 03/07/2020 1500 Gross per 24 hour  Intake -  Output 500 ml  Net -500 ml    PHYSICAL EXAMINATION:  GENERAL:  87 y.o.-year-old patient lying in the bed with no acute distress.  LUNGS: Normal breath sounds bilaterally, no wheezing, rales,rhonchi or crepitation. No use of accessory muscles of  respiration.  CARDIOVASCULAR: S1, S2 normal. No murmurs, rubs, or gallops.  ABDOMEN: Soft, non-tender, non-distended. Bowel sounds present. No organomegaly or mass.  EXTREMITIES: No pedal edema, cyanosis, or clubbing. Right UE cast+ NEUROLOGIC: Cranial nerves II through XII are intact. Muscle strength 5/5 in all extremities. Sensation intact. Gait not checked. weak PSYCHIATRIC: The patient is alert and oriented x 3.  SKIN: No obvious rash, lesion, or ulcer.   DATA REVIEW:   CBC  Recent Labs  Lab 03/07/20 [phone]  WBC 6.3  HGB 8.4*  HCT 25.7*  PLT 139*    Chemistries  Recent Labs  Lab 03/07/20 0519  NA 142  K 4.6  CL 108  CO2 24  GLUCOSE 118*  BUN 64*  CREATININE 1.45*  CALCIUM 8.4*  MG 2.1  AST 12*  ALT 13  ALKPHOS 74  BILITOT 0.5    Microbiology Results   Recent Results (from the past 240 hour(s))  Resp Panel by RT-PCR (Flu A&B, Covid) Nasopharyngeal Swab     Status: Abnormal   Collection Time: 03/06/20  9:45 AM   Specimen: Nasopharyngeal Swab; Nasopharyngeal(NP) swabs in vial transport medium  Result Value Ref Range Status   SARS Coronavirus 2 by RT PCR POSITIVE (A) NEGATIVE Final    Comment: RESULT CALLED TO, READ BACK BY AND VERIFIED WITH: N.GRIFFITH,RN 1122 03/06/20 GM (NOTE) SARS-CoV-2 target nucleic acids are DETECTED.  The SARS-CoV-2 RNA is generally detectable in upper respiratory specimens during the acute phase of infection. Positive results are indicative of the presence of the identified virus, but do not rule out bacterial infection or co-infection with other pathogens not detected by the test. Clinical correlation with patient history and other diagnostic information is necessary to determine patient infection status. The expected result is Negative.  Fact Sheet for Patients: EntrepreneurPulse.com.au  Fact Sheet for Healthcare Providers: IncredibleEmployment.be  This test is not yet approved or cleared by  the [place] FDA and  has been authorized for detection and/or diagnosis of SARS-CoV-2 by FDA under an Emergency Use Authorization (EUA).  This EUA will remain in effect (meaning this test can be use d) for the duration of  the COVID-19 declaration under Section 564(b)(1) of the Act, 21 U.S.C. section 360bbb-3(b)(1), unless the authorization is terminated or revoked sooner.     Influenza A by PCR NEGATIVE NEGATIVE Final   Influenza B by PCR NEGATIVE NEGATIVE Final    Comment: (NOTE) The Xpert Xpress SARS-CoV-2/FLU/RSV plus assay is intended as an aid in the diagnosis of influenza from Nasopharyngeal swab specimens and should not be used as a sole basis for treatment. Nasal washings and aspirates are unacceptable for Xpert Xpress SARS-CoV-2/FLU/RSV testing.  Fact Sheet for Patients: EntrepreneurPulse.com.au  Fact Sheet for Healthcare Providers: IncredibleEmployment.be  This test is not yet approved or cleared by the [place] FDA and has been authorized for detection and/or diagnosis of SARS-CoV-2 by FDA under an Emergency Use Authorization (EUA). This EUA will remain in effect (meaning this test can be used) for the duration of the COVID-19 declaration under Section 564(b)(1) of the Act, 21 U.S.C. section 360bbb-3(b)(1), unless the authorization is terminated or revoked.  Performed at [facility], [address] [place], [place] [address]     RADIOLOGY:  EEG  Result Date: 03/07/2020 Lora Havens, MD     03/07/2020  2:37 PM Patient Name: AZYA BARBERO MRN: [id number] Epilepsy Attending: Lora Havens Referring Physician/Provider: Dr. Kerney Elbe Date: 03/07/2020 Duration: 28.05 mins Patient history: 87 year old female presented with new onset seizure-like activity.  EEG to evaluate for seizures. Level of alertness:lethargic AEDs during EEG study: Keppra, pregabalin Technical aspects: This EEG study was done with  scalp electrodes positioned according to the 10-20 International system of electrode placement. Electrical activity was acquired at a sampling rate of 500Hz  and reviewed with a high frequency filter of 70Hz  and a low frequency filter of 1Hz . EEG data were recorded continuously and digitally stored. Description: No posterior dominant rhythm was seen.  EEG  showed continuous generalized 5 to 7 Hz theta slowing as well as intermittent generalized 2 to 3 Hz delta slowing major signs appeared sharply contoured with triphasic morphology.  Hyperventilation and photic stimulation were not performed.   ABNORMALITY -Continuous slow, generalized IMPRESSION: This study is suggestive of moderate diffuse encephalopathy, nonspecific etiology. No seizures or definite epileptiform discharges were seen throughout the recording. Lora Havens   MR BRAIN WO CONTRAST  Result Date: 03/08/2020 CLINICAL DATA:  Seizure EXAM: MRI HEAD WITHOUT CONTRAST TECHNIQUE: Multiplanar, multiecho pulse sequences of the brain and surrounding structures were obtained without intravenous contrast. COMPARISON:  None. FINDINGS: Sagittal T1, DWI, and coronal T2 sequences were obtained. Motion artifact is present. Patient declined to continue with remainder of study. There is no acute infarction. No apparent mass lesion. No mass effect. No hydrocephalus. Marrow signal is within normal limits. IMPRESSION: Partial study.  No acute infarction or mass. Electronically Signed   By: Macy Mis M.D.   On: 03/08/2020 09:45   Portable chest 1 View  Result Date: 03/06/2020 CLINICAL DATA:  Fall. EXAM: PORTABLE CHEST 1 VIEW COMPARISON:  02/08/2020. FINDINGS: Cardiomegaly again noted. Mild bilateral interstitial prominence again noted, improved from prior exam. Stable calcified densities left lung base. Interval near complete resolution of left pleural effusion. Interval resolution of right pleural effusion. No pneumothorax. Degenerative changes scoliosis  thoracic spine. No acute bony abnormality. IMPRESSION: 1. Cardiomegaly again noted. Mild bilateral interstitial prominence again noted, improved from prior exam. 2. Interval near complete resolution of left pleural effusion. Interval resolution of right pleural effusion. Electronically Signed   By: Marcello Moores  Register   On: 03/06/2020 13:47     CODE STATUS:     Code Status Orders  (From admission, onward)         Start     Ordered   03/06/20 1333  Do not attempt resuscitation (DNR)  Continuous       Question Answer Comment  In the event of cardiac or respiratory ARREST Do not call a "code blue"   In the event of cardiac or respiratory ARREST Do not perform Intubation, CPR, defibrillation or ACLS   In the event of cardiac or respiratory ARREST Use medication by any route, position, wound care, and other measures to relive pain and suffering. May use oxygen, suction and manual treatment of airway obstruction as needed for comfort.      03/06/20 1332        Code Status History    Date Active Date Inactive Code Status Order ID Comments User Context   02/02/2020 2244 02/14/2020 1927 DNR [id number]  Rhetta Mura, DO ED   02/02/2020 1745 02/02/2020 2244 DNR [id number]  Rhetta Mura, DO ED   08/18/2019 1932 08/23/2019 2056 DNR [id number]  Athena Masse, MD ED   08/12/2019 1610 08/15/2019 1935 DNR [id number]  Ivor Costa, MD ED   08/12/2019 1447 08/12/2019 1610 DNR [id number]  Merlyn Lot, MD ED   08/06/2019 1455 08/07/2019 1551 DNR [id number]  Max Sane, MD ED   05/16/2017 0217 05/20/2017 1758 DNR [id number]  Lance Coon, MD Inpatient   09/21/2016 1245 09/26/2016 1836 DNR [id number]  [name], MD ED   10/01/2015 1144 10/02/2015 0748 Full Code [id number]  Bettey Costa, MD Inpatient   Advance Care Planning Activity    Advance Directive Documentation   Flowsheet Row Most Recent Value  Type of Advance Directive Out of facility DNR (pink MOST or yellow form)  Pre-existing out of facility DNR  order (  yellow form or pink MOST form) Physician notified to receive inpatient order  "MOST" Form in Place? -       TOTAL TIME TAKING CARE OF THIS PATIENT: *35 minutes.    Fritzi Mandes M.D  [facility]    CC: Primary care physician; [name], MD

## 2020-03-08 NOTE — NC FL2 (Signed)
[place] LEVEL OF CARE SCREENING TOOL     IDENTIFICATION  Patient Name: Amy [name] Birthdate: 11/14/1927 Sex: female Admission Date (Current Location): 03/06/2020  [place] and [place] Number:  Engineering geologist and Address:  [facility] [address] [place], [place] [address]      Provider Number: [id number]  Attending Physician Name and Address:  Fritzi Mandes, MD  Relative Name and Phone Number:  Sherah Lund [phone]    Current Level of Care: Hospital Recommended Level of Care: [place] Prior Approval Number:    Date Approved/Denied:   PASRR Number: [id number]  Discharge Plan: SNF    Current Diagnoses: Patient Active Problem List   Diagnosis Date Noted  . Atrial fibrillation ([place]) 03/06/2020  . COVID-19 virus infection 03/06/2020  . Seizure ([place]) 03/06/2020  . New onset seizure ([place]) 03/06/2020  . Acute on chronic diastolic heart failure ([place]) 02/02/2020  . Moderate persistent asthma 02/02/2020  . SOB (shortness of breath) 02/02/2020  . Anemia associated with chronic renal failure 10/22/2019  . Postherpetic neuralgia 10/18/2019  . Neuropathic pain of chest 10/18/2019  . Chronic pain syndrome 10/18/2019  . NSTEMI (non-ST elevated myocardial infarction) ([place]) 08/18/2019  . Syncope and collapse 08/13/2019  . Unresponsiveness 08/12/2019  . Hyperkalemia 08/12/2019  . HTN (hypertension) 08/12/2019  . GERD (gastroesophageal reflux disease) 08/12/2019  . Hypothyroidism 08/12/2019  . CAD (coronary artery disease) 08/12/2019  . Shingles 08/12/2019  . Carotid arterial disease ([place])   . Hypotension   . Groin pain, right   . Chest pain 08/06/2019  . Renovascular hypertension 03/08/2019  . Renal artery stenosis ([place]) 01/28/2019  . Multiple renal cysts 12/29/2018  . Renal stone 12/29/2018  . CKD (chronic kidney disease) stage 4, GFR 15-29 ml/min (HCC) 11/23/2018  . Diabetes ([place]) 06/29/2017   . CAP (community acquired pneumonia) 05/16/2017  . GI bleed 09/21/2016  . Vitamin D deficiency 11/04/2015  . Multiple lung nodules   . COPD with chronic bronchitis ([place])   . New onset atrial fibrillation ([place]) 10/04/2015  . Anemia 10/04/2015  . Acute kidney injury superimposed on CKD ([place]) 10/03/2015  . Palliative care encounter 06/05/2015  . Morbid obesity ([place]) 12/05/2014  . Angina pectoris associated with type 2 diabetes mellitus ([place]) 12/05/2014  . Type 2 diabetes mellitus with hyperglycemia ([place]) 08/22/2014  . Chronic diastolic CHF (congestive heart failure) ([place]) 09/06/2013  . Preop cardiovascular exam 06/03/2012  . Iron deficiency anemia 09/08/2011  . HLD (hyperlipidemia) 07/18/2009  . Coronary artery disease involving native coronary artery of native heart without angina pectoris 07/18/2009  . Acquired hypothyroidism 07/12/2009  . Essential hypertension 07/12/2009    Orientation RESPIRATION BLADDER Height & Weight     Self  Normal Continent,Incontinent Weight: 85 kg Height:  5\' 2"  (157.5 cm)  BEHAVIORAL SYMPTOMS/MOOD NEUROLOGICAL BOWEL NUTRITION STATUS      Continent,Incontinent Diet (Regular diet)  AMBULATORY STATUS COMMUNICATION OF NEEDS Skin   Limited Assist Verbally Normal                       Personal Care Assistance Level of Assistance  Bathing,Feeding,Dressing Bathing Assistance: Limited assistance Feeding assistance: Limited assistance Dressing Assistance: Limited assistance     Functional Limitations Info             SPECIAL CARE FACTORS FREQUENCY  PT (By licensed PT),OT (By licensed OT)     PT Frequency: 5 times per week OT Frequency: 5 times per week  Contractures Contractures Info: Not present    Additional Factors Info  Code Status,Allergies Code Status Info: DNR Allergies Info: Atenolol, codeine, losartan, morphine, NASAIDS, rofecoxib, sucralfate, sulfa antibiotics           Current Medications (03/08/2020):   This is the current hospital active medication list Current Facility-Administered Medications  Medication Dose Route Frequency Provider Last Rate Last Admin  . 0.9 %  sodium chloride infusion  250 mL Intravenous PRN Wouk, Ailene Rud, MD      . acetaminophen (TYLENOL) tablet 1,000 mg  1,000 mg Oral Q6H PRN Gwynne Edinger, MD   1,000 mg at 03/07/20 2234  . albuterol (VENTOLIN HFA) 108 (90 Base) MCG/ACT inhaler 2 puff  2 puff Inhalation Q6H PRN Fritzi Mandes, MD      . amitriptyline (ELAVIL) tablet 10 mg  10 mg Oral QHS Fritzi Mandes, MD   10 mg at 03/07/20 2230  . cloNIDine (CATAPRES) tablet 0.1 mg  0.1 mg Oral BID Fritzi Mandes, MD      . clopidogrel (PLAVIX) tablet 75 mg  75 mg Oral Daily Gwynne Edinger, MD   75 mg at 03/07/20 1150  . doxazosin (CARDURA) tablet 8 mg  8 mg Oral Daily Fritzi Mandes, MD   8 mg at 03/07/20 1715  . enoxaparin (LOVENOX) injection 30 mg  30 mg Subcutaneous Q24H Gwynne Edinger, MD   30 mg at 03/07/20 2230  . iron polysaccharides (NIFEREX) capsule 150 mg  150 mg Oral Daily Fritzi Mandes, MD   150 mg at 03/07/20 1715  . levETIRAcetam (KEPPRA) tablet 500 mg  500 mg Oral BID Fritzi Mandes, MD      . levothyroxine (SYNTHROID) tablet 88 mcg  88 mcg Oral QAC breakfast Gwynne Edinger, MD   88 mcg at 03/08/20 2993  . lisinopril (ZESTRIL) tablet 2.5 mg  2.5 mg Oral Daily Fritzi Mandes, MD   2.5 mg at 03/07/20 1714  . melatonin tablet 5 mg  5 mg Oral QHS PRN Fritzi Mandes, MD      . mometasone-formoterol [facility]) 200-5 MCG/ACT inhaler 2 puff  2 puff Inhalation BID Gwynne Edinger, MD   2 puff at 03/07/20 2230  . montelukast (SINGULAIR) tablet 10 mg  10 mg Oral QHS Gwynne Edinger, MD   10 mg at 03/07/20 2230  . nitroGLYCERIN (NITROSTAT) SL tablet 0.4 mg  0.4 mg Sublingual Q5 Min x 3 PRN Fritzi Mandes, MD      . pregabalin (LYRICA) capsule 50 mg  50 mg Oral q AM Shanlever, Pierce Crane, RPH       And  . pregabalin (LYRICA) capsule 100 mg  100 mg Oral QHS Lu Duffel,  RPH   100 mg at 03/07/20 2229  . pregabalin (LYRICA) capsule 50 mg  50 mg Oral BID Gwynne Edinger, MD   50 mg at 03/07/20 2229  . remdesivir 100 mg in sodium chloride 0.9 % 100 mL IVPB  100 mg Intravenous Once Fritzi Mandes, MD      . simvastatin (ZOCOR) tablet 40 mg  40 mg Oral QHS Gwynne Edinger, MD   40 mg at 03/07/20 2230  . sodium chloride flush (NS) 0.9 % injection 3 mL  3 mL Intravenous Q12H Wouk, Ailene Rud, MD   3 mL at 03/07/20 2231  . sodium chloride flush (NS) 0.9 % injection 3 mL  3 mL Intravenous PRN Wouk, Ailene Rud, MD      . torsemide [facility]) tablet 20 mg  20 mg Oral Daily Wouk, Ailene Rud, MD   20 mg at 03/07/20 1150   Facility-Administered Medications Ordered in Other Encounters  Medication Dose Route Frequency Provider Last Rate Last Admin  . 0.9 %  sodium chloride infusion   Intravenous Continuous Lloyd Huger, MD      . epoetin alfa-epbx (RETACRIT) injection 40,000 Units  40,000 Units Subcutaneous Once Lloyd Huger, MD         Discharge Medications: Please see discharge summary for a list of discharge medications.  Relevant Imaging Results:  Relevant Lab Results:   Additional Information HW#:[id number]  Shelbie Hutching, RN

## 2020-03-08 NOTE — TOC Progression Note (Signed)
Transition of Care [facility]) - Progression Note    Patient Details  Name: LUCRESIA SIMIC MRN: [id number] Date of Birth: 18-Sep-1927  Transition of Care [facility]) CM/SW Contact  Shelbie Hutching, RN Phone Number: 03/08/2020, 1:19 PM  Clinical Narrative:    [facility] has been arranged to pick patient up and transport to [facility].  EMS is backed up and cannot give estimated time of arrival.    Expected Discharge Plan: [place] Barriers to Discharge: Barriers Resolved  Expected Discharge Plan and Services Expected Discharge Plan: [place]   Discharge Planning Services: CM Consult Post Acute Care Choice: [place] Living arrangements for the past 2 months: Single Family Home Expected Discharge Date: 03/08/20                                     Social Determinants of Health (SDOH) Interventions    Readmission Risk Interventions Readmission Risk Prevention Plan 08/23/2019 08/15/2019  Transportation Screening Complete Complete  Medication Review Press photographer) Complete Complete  PCP or Specialist appointment within 3-5 days of discharge Complete Complete  HRI or Home Care Consult Complete Complete  SW Recovery Care/Counseling Consult Complete Complete  Palliative Care Screening Not Applicable Not [place] Complete Not Applicable  Some recent data might be hidden

## 2020-03-08 NOTE — TOC Transition Note (Signed)
Transition of Care [facility]) - CM/SW Discharge Note   Patient Details  Name: Amy [name] MRN: [id number] Date of Birth: 1927/12/30  Transition of Care [facility]) CM/SW Contact:  Shelbie Hutching, RN Phone Number: 03/08/2020, 10:24 AM   Clinical Narrative:    Patient has been medically cleared for discharge back to [facility] and [place].  PT is still recommending SNF.  Patient's daughter updated on discharge to facility today.  Patient will be going to room E14.  Bedside RN will call report to [phone].  Once discharge has been completed RNCM will arrange EMS transport.     Final next level of care: Skilled Nursing Facility Barriers to Discharge: Barriers Resolved   Patient Goals and CMS Choice Patient states their goals for this hospitalization and ongoing recovery are:: For patient to be able to go home CMS Medicare.gov Compare Post Acute Care list provided to:: Patient Represenative (must comment) Choice offered to / list presented to : Adult Children  Discharge Placement              Patient chooses bed at: [facility] Patient to be transferred to facility by: [facility] Name of family member notified: [place] Patient and family notified of of transfer: 03/08/20  Discharge Plan and Services   Discharge Planning Services: CM Consult Post Acute Care Choice: [place]                               Social Determinants of Health (SDOH) Interventions     Readmission Risk Interventions Readmission Risk Prevention Plan 08/23/2019 08/15/2019  Transportation Screening Complete Complete  Medication Review Press photographer) Complete Complete  PCP or Specialist appointment within 3-5 days of discharge Complete Complete  HRI or Home Care Consult Complete Complete  SW Recovery Care/Counseling Consult Complete Complete  Palliative Care Screening Not Applicable Not [place] Complete Not  Applicable  Some recent data might be hidden

## 2020-03-09 ENCOUNTER — Telehealth: Payer: Self-pay

## 2020-03-09 NOTE — Telephone Encounter (Signed)
957 am.  Return call made to [name] to follow up on patient's cell phone.  I am unable to locate information on who or how the patient's cell phone was established.  I have reached out to previous team members who worked with patient under [place] but have been unsuccessful in obtaining information related to this.  Message has been left on daughter's cell phone with call back # should any other questions arise.

## 2020-03-15 ENCOUNTER — Ambulatory Visit: Payer: Self-pay | Admitting: Urology

## 2020-03-16 ENCOUNTER — Ambulatory Visit: Payer: Medicare Other | Admitting: Family

## 2020-03-22 ENCOUNTER — Ambulatory Visit: Payer: Medicare Other | Admitting: Podiatry

## 2020-03-23 ENCOUNTER — Telehealth: Payer: Self-pay | Admitting: Cardiovascular Disease

## 2020-03-23 NOTE — Telephone Encounter (Signed)
Spoke with the patients daughter Amy [name]. She confirmed that the patient is taking oral clonidine 0.1 mg bid.  Amy [name] is wondering if the patient needs to be switched back to the clonidine  patch. Amy [name] sts that according to the rehab facility the patients blood pressure has been ok. Amy [name] is unable to provide specific readings.  Ardeen Fillers that if the patient is stable on her current regimen there should not be a need to change. Patient is scheduled for an appt in March with Dr. Rockey Situ. Ardeen Fillers that she should contact the office in the interim if something changes. Amy [name] rqst an update be fwd to Dr. Rockey Situ.  Adv her that I will fwd the msg. Amy [name] voiced appreciation for the assistance.

## 2020-03-23 NOTE — Telephone Encounter (Signed)
Pt c/o medication issue:  1. Name of Medication:  BP patch   2. How are you currently taking this medication (dosage and times per day)? Previously dc   3. Are you having a reaction (difficulty breathing--STAT)? Possible seizure vs syncope due to cancelling by hospitalist on a previous admission   4. What is your medication issue? Neuro md told family patient may have had withdrawal bp patch that may have caused from seizure vs syncope     The daughter was  told by hospital staff on an admission that the patch was replaced with PO but is not sure .    Please call to discuss medications.

## 2020-03-23 NOTE — Telephone Encounter (Signed)
Patient daughter [name] calling  States that they had replaced the clonidine patch with oral clonodine 0.1 MG tablets 2 times daily  Please call to discuss

## 2020-03-23 NOTE — Telephone Encounter (Addendum)
Spoke with the patients daughter [name]. Confirmed that the patients clonidine patch (transdermal) was switched to oral tablets during her Dec 2021 hospitalization. Clonidine tablets 0.1 mg bid is listed on the patients most recent discharge summary.  [name] is unable to verify the patients current medications since the patient is currently residing at a rehab facility. [name] is going to obtain a current medication list and call back.

## 2020-03-24 ENCOUNTER — Other Ambulatory Visit: Payer: Self-pay | Admitting: Cardiovascular Disease

## 2020-03-27 ENCOUNTER — Telehealth: Payer: Medicare Other | Admitting: Student in an Organized Health Care Education/Training Program

## 2020-03-28 ENCOUNTER — Ambulatory Visit: Payer: Medicare Other | Admitting: Family

## 2020-03-29 ENCOUNTER — Encounter: Payer: Self-pay | Admitting: Podiatry

## 2020-03-29 ENCOUNTER — Other Ambulatory Visit: Payer: Self-pay

## 2020-03-29 ENCOUNTER — Ambulatory Visit (INDEPENDENT_AMBULATORY_CARE_PROVIDER_SITE_OTHER): Payer: Medicare Other | Admitting: Podiatry

## 2020-03-29 DIAGNOSIS — B351 Tinea unguium: Secondary | ICD-10-CM

## 2020-03-29 DIAGNOSIS — D689 Coagulation defect, unspecified: Secondary | ICD-10-CM

## 2020-03-29 DIAGNOSIS — E119 Type 2 diabetes mellitus without complications: Secondary | ICD-10-CM

## 2020-03-29 DIAGNOSIS — L608 Other nail disorders: Secondary | ICD-10-CM

## 2020-03-29 DIAGNOSIS — M79676 Pain in unspecified toe(s): Secondary | ICD-10-CM

## 2020-03-29 NOTE — Progress Notes (Signed)
This patient returns to my office for at risk foot care.  This patient requires this care by a professional since this patient will be at risk due to having chronic kidney disease diabetes and coagulation defect.  Patient is taking plavix.  This patient is unable to cut nails herself since the patient cannot reach her nails.These nails are painful walking and wearing shoes.  This patient presents for at risk foot care today. Patient presents to the office in a wheelchair due to shingles.    General Appearance  Alert, conversant and in no acute stress.  Vascular  Dorsalis pedis and posterior tibial  pulses are weakly palpable  bilaterally.  Capillary return is within normal limits  bilaterally. Cold feet  Bilaterally. Absent digital hair  B/L.  Neurologic  Senn-Weinstein monofilament wire test within normal limits  bilaterally. Muscle power within normal limits bilaterally.  Nails Thick disfigured discolored nails with subungual debris  from hallux to fifth toes bilaterally. No evidence of bacterial infection or drainage bilaterally.  Orthopedic  No limitations of motion  feet .  No crepitus or effusions noted.  No bony pathology or digital deformities noted.  Skin  normotropic skin with no porokeratosis noted bilaterally.  No signs of infections or ulcers noted.     Onychomycosis  Pain in right toes  Pain in left toes  Consent was obtained for treatment procedures.   Mechanical debridement of nails 1-5  bilaterally performed with a nail nipper.  Filed with dremel without incident.   Return office visit   4 months                   Told patient to return for periodic foot care and evaluation due to potential at risk complications.   Gardiner Barefoot DPM

## 2020-03-29 NOTE — Progress Notes (Deleted)
Patient ID: Amy [name], female    DOB: 1927/05/03, 87 y.o.   MRN: [id number]  HPI  Ms Amy [name] is a 87 y/o female with a history of ovarian cancer, carotid disease, CAD, DM, HTN, CKD, thyroid disease, anemia, GIB and chronic heart failure.   Echo report from 02/07/20 reviewed and showed an EF of 55-60% along with mild LVH, mildly elevated PA pressureEcho report from 10/22/16 reviewed and showed an EF of 65-70%.   Admitted 05/15/17 due to pneumonia and HF. Melena noted and GI was consults. 1 unit of PCBC's given. Given IV antibiotics and she was discharged after 5 days.   She presents today for a follow-up visit with a chief complaint of moderate fatigue upon minimal exertion. She describes this as chronic in nature having been present for several years. She has associated shortness of breath, light-headedness, easy bruising and difficulty sleeping along with this. She denies any abdominal distention, palpitations, pedal edema, chest pain or weight gain. Continues to go to [employer] classes 2 days / week.   Past Medical History:  Diagnosis Date  . Bladder incontinence   . CAD (coronary artery disease)    a. 05/2009 Cath: LAD 90p (Xience 2.75 x 12 mm DES), 72m, D1 40, LCx 40p/m, RCA 30/40/30p/m, RCA 30/25d;  b.  12/2011 Lexiscan MV: no ischemia, breast attenuation artifact, normal EF-->Low risk; c. 10/2016 MV: fixed apical defect, most likely apical thinning and attenuation, No ischemia, EF 60%.  . Cancer ([place])    ovarian  . Carotid arterial disease w/ R Carotid Bruit (HCC)    a. 08/2016 Carotid U/S: 40-59% bilat ICA stenosis - f/u 1 yr.  . Chronic diastolic (congestive) heart failure ([place])    a. Echo 09/2015: EF 55-60% w/ Grade 1 DD, sev Ca2+ MV annulus, mildly dil LA; b. 09/2016 Echo: EF 65-70%, Gr2 DD, mildly dil LA/RA, nl RV fxn.  . Chronic Dyspnea on exertion   . CKD (chronic kidney disease) stage 3, GFR 30-59 ml/min (HCC)   . Degenerative arthritis of knee    bilateral knees  .  Diabetes mellitus    Type II  . GIB (gastrointestinal bleeding)    a. 08/2016 Admission w/ presyncope/anemia/melena-->req Transfusion-->endo ok, colonoscopy w/ polyps but no source of bleeding (most likely diverticular).  . Hiatal hernia   . Hypertension   . Iron deficiency   . Menopausal symptoms   . Morbid obesity ([place])   . Renal insufficiency   . Thyroid disease    hypothyroidism   Past Surgical History:  Procedure Laterality Date  . COLONOSCOPY  2015  . COLONOSCOPY WITH PROPOFOL N/A 09/25/2016   Procedure: COLONOSCOPY WITH PROPOFOL;  Surgeon: Amy Bellows, MD;  Location[facility] ENDOSCOPY;  Service: Gastroenterology;  Laterality: N/A;  . COLONOSCOPY WITH PROPOFOL N/A 09/26/2016   Procedure: COLONOSCOPY WITH PROPOFOL;  Surgeon: Amy Bellows, MD;  Location[facility] ENDOSCOPY;  Service: Gastroenterology;  Laterality: N/A;  . ESOPHAGOGASTRODUODENOSCOPY (EGD) WITH PROPOFOL N/A 09/25/2016   Procedure: ESOPHAGOGASTRODUODENOSCOPY (EGD) WITH PROPOFOL;  Surgeon: Amy Bellows, MD;  Location: [facility] ENDOSCOPY;  Service: Gastroenterology;  Laterality: N/A;  . RENAL ANGIOGRAPHY N/A 02/07/2019   Procedure: RENAL ANGIOGRAPHY;  Surgeon: Amy Huxley, MD;  Location: [facility] CV LAB;  Service: Cardiovascular;  Laterality: N/A;  . REPLACEMENT TOTAL KNEE BILATERAL    . TOTAL VAGINAL HYSTERECTOMY     ovarian mass, not cancerous  . UPPER GI ENDOSCOPY  2015   Family History  Problem Relation Age of Onset  . Breast  cancer Mother 13   Social History   Tobacco Use  . Smoking status: Never Smoker  . Smokeless tobacco: Never Used  Substance Use Topics  . Alcohol use: No   Allergies  Allergen Reactions  . Atenolol Other (See Comments)    Other reaction(s): Other (See Comments) Decreased heart rate Decreased heart rate  . Codeine Shortness Of Breath    Rash, difficulty breathing, nausea.  . Losartan Other (See Comments)    Hyperkalemia  . Morphine And Related Shortness Of Breath    Rash, difficulty  breathing, nausea.   . Nsaids Other (See Comments)    Other reaction(s): Unknown  . Rofecoxib     Other reaction(s): Unknown  . Sucralfate Other (See Comments)    Throat tightness  . Sulfa Antibiotics     Other reaction(s): Unknown     Review of Systems  Constitutional: Positive for fatigue (easily). Negative for appetite change.  HENT: Negative for congestion, postnasal drip and sore throat.   Eyes: Negative.   Respiratory: Positive for shortness of breath (easily). Negative for chest tightness.   Cardiovascular: Negative for chest pain, palpitations and leg swelling.  Gastrointestinal: Negative for abdominal distention and abdominal pain.  Endocrine: Negative.   Genitourinary: Negative.   Musculoskeletal: Negative for back pain.  Skin: Negative.   Allergic/Immunologic: Negative.   Neurological: Positive for light-headedness (on occasion). Negative for dizziness.  Hematological: Negative for adenopathy. Bruises/bleeds easily.  Psychiatric/Behavioral: Positive for dysphoric mood (sometimes with death of daughter a few years ago) and sleep disturbance (wake up every few hours). The patient is not nervous/anxious.      Physical Exam Vitals and nursing note reviewed.  Constitutional:      Appearance: She is well-developed.  HENT:     Head: Normocephalic and atraumatic.  Neck:     Vascular: No JVD.  Cardiovascular:     Rate and Rhythm: Normal rate and regular rhythm.  Pulmonary:     Effort: Pulmonary effort is normal. No respiratory distress.     Breath sounds: No wheezing or rales.  Abdominal:     General: There is no distension.     Palpations: Abdomen is soft.  Musculoskeletal:     Cervical back: Normal range of motion and neck supple.     Right lower leg: No edema.     Left lower leg: No edema.  Skin:    General: Skin is warm and dry.  Neurological:     Mental Status: She is alert and oriented to person, place, and time.  Psychiatric:        Behavior: Behavior  normal.    Assessment & Plan:   1; Chronic heart failure with preserved ejection fraction- - NYHA class III - euvolemic today - weighing daily and says that her weight has gradually declined. Reminded to call for an overnight weight gain of >2 pounds or a weekly weight gain of >5 pounds - weight down 10 pounds since she was last here 3 months ago - not adding salt to her food but admits that she doesn't read food labels much. Reviewed the importance of closely following a 2000mg  sodium diet  - has great difficulty in walking even short distances - going to [employer] classes twice weekly; encouraged her to increase her activity at home as her shortness of breath may be related to deconditioning - saw cardiology Rockey Situ) 09/02/17 - saw pulmonology Ashby Dawes) 07/27/17 - BNP 10/01/15 was 53.0  2: HTN- - BP mildly elevated today - saw  PCP Edwina Barth) 09/21/17 - BMP 09/21/17 reviewed and showed sodium 146, potassium 4.4 and GFR 42  3: DM- - nonfasting glucose in clinic today was 188; ate 1/2 frozen waffle with tsp of syrup and a peach - A1c 09/21/17 was 6.3%  4: Anemia- - saw hematologist Grayland Ormond ) 08/20/17 - receiving iron infusions ~ every 2 months  Patient did not bring her medications nor a list. Each medication was verbally reviewed with the patient and she was encouraged to bring the bottles to every visit to confirm accuracy of list.  Will not make a return appointment at this time. Advised patient that she could call back at anytime to make another appointment.

## 2020-03-30 ENCOUNTER — Telehealth: Payer: Self-pay | Admitting: Primary Care

## 2020-03-30 ENCOUNTER — Non-Acute Institutional Stay: Payer: Medicare Other | Admitting: Primary Care

## 2020-03-30 DIAGNOSIS — B0229 Other postherpetic nervous system involvement: Secondary | ICD-10-CM

## 2020-03-30 DIAGNOSIS — Z515 Encounter for palliative care: Secondary | ICD-10-CM

## 2020-03-30 DIAGNOSIS — R0602 Shortness of breath: Secondary | ICD-10-CM

## 2020-03-30 DIAGNOSIS — G894 Chronic pain syndrome: Secondary | ICD-10-CM

## 2020-03-30 NOTE — Progress Notes (Signed)
[facility] Consult Note Telephone: [phone]  Fax: [phone]     Date of encounter: 03/30/20 PATIENT NAME: Amy [name] [address] [id number] [phone] (home)  DOB: 04-Sep-1927 MRN: [id number]  PRIMARY CARE PROVIDER:    [name], MD,  [place] [address] [phone]  REFERRING PROVIDER:   Dr. Briant Sites HartmanMD  RESPONSIBLE PARTY:   Extended Emergency Contact Information Primary Emergency Contact: Daylene Katayama [place] Phone: [phone] Relation: Daughter Secondary Emergency Contact: Burkman,eddie Mobile Phone: [phone] Relation: Son  I met face to face with patient  in facility. Palliative Care was asked to follow this patient by consultation request of Dr Joaquin Courts  to help address advance care planning and goals of care. This is a follow up  visit.   ASSESSMENT AND RECOMMENDATIONS:   1. Advance Care Planning/Goals of Care: Goals include to maximize quality of life and symptom management. Our advance care planning conversation included a discussion about:     Exploration of personal, cultural or spiritual beliefs that might influence medical decisions   Exploration of goals of care in the event of a sudden injury or illness   Identification and preparation of a healthcare agent  - Daughter I spoke with daughter and power of attorney over the phone regarding patient status. We discussed her initial hospitalization after she fell and sustained a fracture during  her current rehabilitation process. We discussed possible disposition as initially they were planning to bring her home back to her home where she lived independently. We discussed possible caregiving needs as a result of these two hospitalizations and subsequent rehab. Discussed the progression of debility especially in light of fractures and other new developments  in her health. Daughter will keep in touch with the social worker at [facility] to be apprised of  recommendations for a long-term plan. We discussed how long-term care would be financed if appropriate and needed  or the range of services that would be available in the home setting.  2. Symptom Management:   Pain: C/o in broken shoulder. Please schedule tylenol ATC for better pain relief. 1 gm q 8 hrs or CR is best.  Nutrition: eating well, offer additional nutritional supplements for poor intake.  Safety: Provide assistance with all adls due to arm fx and weakness. Please get pt oob daily in chair.  3. Follow up Palliative Care Visit: Palliative care will continue to follow for goals of care clarification and symptom management. Return 4 weeks or prn.  4. Family /Caregiver/Community Supports:   5. Cognitive / Functional decline:   I spent 35 minutes providing this consultation,  from 1200 to 1305. More than 50% of the time in this consultation was spent in counseling and care coordination.  CODE STATUS:  PPS: 30%  HOSPICE ELIGIBILITY/DIAGNOSIS: TBD  Subjective:  CHIEF COMPLAINT: debility  HISTORY OF PRESENT ILLNESS:  Amy [name] is a 87 y.o. year old female  with Recently living at home, and became debilitated with CHF. While at SNF suffered seizure and contracted covid, and fx R shoulder. She is  Here for rehab, pain management, restorative care. .   We are asked to consult around advance care planning and complex medical decision making.    Review and summarization of old Epic records shows or history from other than patient. Review or lab tests, radiology,  or medicine : Labs Review of case with family member.  History  obtained from review of EMR, discussion with primary team, and  interview with family, caregiver  and/or Ms. Smedley. Records reviewed and summarized above.   CURRENT PROBLEM LIST:  Patient Active Problem List   Diagnosis Date Noted  . Closed  nondisplaced fracture of head of right radius   . Atrial fibrillation ([place]) 03/06/2020  . COVID-19 virus infection 03/06/2020  . Seizure ([place]) 03/06/2020  . New onset seizure ([place]) 03/06/2020  . Acute on chronic diastolic heart failure ([place]) 02/02/2020  . Moderate persistent asthma 02/02/2020  . SOB (shortness of breath) 02/02/2020  . Anemia associated with chronic renal failure 10/22/2019  . Postherpetic neuralgia 10/18/2019  . Neuropathic pain of chest 10/18/2019  . Chronic pain syndrome 10/18/2019  . NSTEMI (non-ST elevated myocardial infarction) ([place]) 08/18/2019  . Syncope and collapse 08/13/2019  . Unresponsiveness 08/12/2019  . Hyperkalemia 08/12/2019  . HTN (hypertension) 08/12/2019  . GERD (gastroesophageal reflux disease) 08/12/2019  . Hypothyroidism 08/12/2019  . CAD (coronary artery disease) 08/12/2019  . Shingles 08/12/2019  . Carotid arterial disease ([place])   . Hypotension   . Groin pain, right   . Chest pain 08/06/2019  . Renovascular hypertension 03/08/2019  . Renal artery stenosis ([place]) 01/28/2019  . Multiple renal cysts 12/29/2018  . Renal stone 12/29/2018  . CKD (chronic kidney disease) stage 4, GFR 15-29 ml/min (HCC) 11/23/2018  . Diabetes ([place]) 06/29/2017  . CAP (community acquired pneumonia) 05/16/2017  . GI bleed 09/21/2016  . Vitamin D deficiency 11/04/2015  . Multiple lung nodules   . COPD with chronic bronchitis ([place])   . New onset atrial fibrillation ([place]) 10/04/2015  . Anemia 10/04/2015  . Acute kidney injury superimposed on CKD ([place]) 10/03/2015  . Palliative care encounter 06/05/2015  . Morbid obesity ([place]) 12/05/2014  . Angina pectoris associated with type 2 diabetes mellitus ([place]) 12/05/2014  . Type 2 diabetes mellitus with hyperglycemia ([place]) 08/22/2014  . Chronic diastolic CHF (congestive heart failure) ([place]) 09/06/2013  . Preop cardiovascular exam 06/03/2012  . Iron deficiency anemia 09/08/2011  . HLD (hyperlipidemia) 07/18/2009  .  Coronary artery disease involving native coronary artery of native heart without angina pectoris 07/18/2009  . Acquired hypothyroidism 07/12/2009  . Essential hypertension 07/12/2009   PAST MEDICAL HISTORY:  Active Ambulatory Problems    Diagnosis Date Noted  . Acquired hypothyroidism 07/12/2009  . HLD (hyperlipidemia) 07/18/2009  . Essential hypertension 07/12/2009  . Coronary artery disease involving native coronary artery of native heart without angina pectoris 07/18/2009  . Iron deficiency anemia 09/08/2011  . Preop cardiovascular exam 06/03/2012  . Chronic diastolic CHF (congestive heart failure) ([place]) 09/06/2013  . Morbid obesity ([place]) 12/05/2014  . Angina pectoris associated with type 2 diabetes mellitus ([place]) 12/05/2014  . Palliative care encounter 06/05/2015  . Acute kidney injury superimposed on CKD ([place]) 10/03/2015  . New onset atrial fibrillation ([place]) 10/04/2015  . Anemia 10/04/2015  . COPD with chronic bronchitis ([place])   . Multiple lung nodules   . Vitamin D deficiency 11/04/2015  . GI bleed 09/21/2016  . CAP (community acquired pneumonia) 05/16/2017  . Diabetes ([place]) 06/29/2017  . CKD (chronic kidney disease) stage 4, GFR 15-29 ml/min (HCC) 11/23/2018  . Multiple renal cysts 12/29/2018  . Renal stone 12/29/2018  . Type 2 diabetes mellitus with hyperglycemia ([place]) 08/22/2014  . Renal artery stenosis ([place]) 01/28/2019  . Renovascular hypertension 03/08/2019  . Chest pain 08/06/2019  . Unresponsiveness 08/12/2019  . Hyperkalemia 08/12/2019  . HTN (hypertension) 08/12/2019  . GERD (gastroesophageal reflux  disease) 08/12/2019  . Hypothyroidism 08/12/2019  . CAD (coronary artery disease) 08/12/2019  . Carotid arterial disease (HCC)   . Hypotension   . Shingles 08/12/2019  . Groin pain, right   . Syncope and collapse 08/13/2019  . NSTEMI (non-ST elevated myocardial infarction) (HCC) 08/18/2019  . Postherpetic neuralgia 10/18/2019  . Neuropathic pain of chest  10/18/2019  . Chronic pain syndrome 10/18/2019  . Anemia associated with chronic renal failure 10/22/2019  . Acute on chronic diastolic heart failure (HCC) 02/02/2020  . Moderate persistent asthma 02/02/2020  . SOB (shortness of breath) 02/02/2020  . Atrial fibrillation (HCC) 03/06/2020  . COVID-19 virus infection 03/06/2020  . Seizure (HCC) 03/06/2020  . New onset seizure (HCC) 03/06/2020  . Closed nondisplaced fracture of head of right radius    Resolved Ambulatory Problems    Diagnosis Date Noted  . DYSPNEA 01/21/2010  . Diabetes type 2, uncontrolled (HCC) 12/05/2014  . Acute on chronic diastolic (congestive) heart failure (HCC) 10/01/2015  . COPD exacerbation (HCC) 10/04/2015  . Acute bronchitis   . Sepsis (HCC) 05/16/2017   Past Medical History:  Diagnosis Date  . Bladder incontinence   . Cancer (HCC)   . Chronic diastolic (congestive) heart failure (HCC)   . Chronic Dyspnea on exertion   . CKD (chronic kidney disease) stage 3, GFR 30-59 ml/min (HCC)   . Degenerative arthritis of knee   . Diabetes mellitus   . GIB (gastrointestinal bleeding)   . Hiatal hernia   . Hypertension   . Iron deficiency   . Menopausal symptoms   . Renal insufficiency   . Thyroid disease    SOCIAL HX:  Social History   Tobacco Use  . Smoking status: Never Smoker  . Smokeless tobacco: Never Used  Substance Use Topics  . Alcohol use: No   FAMILY HX:  Family History  Problem Relation Age of Onset  . Breast cancer Mother 57      ALLERGIES:  Allergies  Allergen Reactions  . Atenolol Other (See Comments)    Other reaction(s): Other (See Comments) Decreased heart rate Decreased heart rate  . Codeine Shortness Of Breath    Rash, difficulty breathing, nausea.  . Losartan Other (See Comments)    Hyperkalemia  . Morphine And Related Shortness Of Breath    Rash, difficulty breathing, nausea.   . Nsaids Other (See Comments)    Other reaction(s): Unknown  . Rofecoxib     Other  reaction(s): Unknown  . Sucralfate Other (See Comments)    Throat tightness  . Sulfa Antibiotics     Other reaction(s): Unknown     PERTINENT MEDICATIONS:  Outpatient Encounter Medications as of 03/30/2020  Medication Sig  . acetaminophen (TYLENOL) 500 MG tablet Take 1,000 mg by mouth 3 (three) times daily as needed for mild pain or moderate pain.  Marland Kitchen albuterol (VENTOLIN HFA) 108 (90 Base) MCG/ACT inhaler Inhale into the lungs every 6 (six) hours as needed for wheezing or shortness of breath.  Marland Kitchen amitriptyline (ELAVIL) 10 MG tablet Take 10 mg by mouth at bedtime.  . cloNIDine (CATAPRES) 0.1 MG tablet Take 1 tablet (0.1 mg total) by mouth 2 (two) times daily.  . clopidogrel (PLAVIX) 75 MG tablet Take 1 tablet (75 mg total) by mouth daily.  Marland Kitchen docusate sodium (COLACE) 100 MG capsule Take 1 capsule (100 mg total) by mouth 2 (two) times daily.  Marland Kitchen doxazosin (CARDURA) 4 MG tablet Take 4 mg by mouth daily.  . iron polysaccharides (NIFEREX) 150 MG  capsule Take 1 capsule (150 mg total) by mouth daily.  Marland Kitchen levETIRAcetam (KEPPRA) 500 MG tablet Take 1 tablet (500 mg total) by mouth 2 (two) times daily.  Marland Kitchen levothyroxine (SYNTHROID) 88 MCG tablet 88 mcg daily before breakfast.   . lisinopril (ZESTRIL) 2.5 MG tablet Take 2.5 mg by mouth daily.  . melatonin 5 MG TABS Take 1 tablet (5 mg total) by mouth at bedtime as needed (sleep).  . montelukast (SINGULAIR) 10 MG tablet Take 10 mg by mouth at bedtime.   . nitroGLYCERIN (NITROSTAT) 0.4 MG SL tablet Place 1 tablet (0.4 mg total) under the tongue every 5 (five) minutes x 3 doses as needed for chest pain.  . pantoprazole (PROTONIX) 20 MG tablet Take 20 mg by mouth daily.   . polyethylene glycol (MIRALAX / GLYCOLAX) 17 g packet Take 17 g by mouth daily.  . pregabalin (LYRICA) 50 MG capsule Take 50-100 mg by mouth See admin instructions. Take 1 capsule ($RemoveBe'50mg'AWFgimYtj$ ) by mouth every morning and take 2 capsules ($RemoveBef'100mg'tbmSYdjpGS$ ) by mouth every night at bedtime  . simvastatin (ZOCOR) 40  MG tablet Take 1 tablet (40 mg total) by mouth at bedtime.  . SYMBICORT 160-4.5 MCG/ACT inhaler Inhale 2 puffs into the lungs 2 (two) times daily.   Marland Kitchen torsemide (DEMADEX) 20 MG tablet Take 20 mg by mouth daily.   Facility-Administered Encounter Medications as of 03/30/2020  Medication  . 0.9 %  sodium chloride infusion  . epoetin alfa-epbx (RETACRIT) injection 40,000 Units    Objective: ROS  General: NAD EYES: denies vision changes ENMT: denies dysphagia Cardiovascular: denies chest pain Pulmonary: denies  cough, denies increased SOB Abdomen: endorses good appetite, denies constipation, endorses continence of bowel GU: denies dysuria, endorses continence of urine MSK:  endorses ROM limitations, no falls reported Skin: denies rashes or wounds Neurological: endorses weakness, endorses  pain, denies insomnia Psych: Endorses discouraged mood Heme/lymph/immuno: denies bruises, abnormal bleeding  Physical Exam: Current and past weights: 180 lbs in 11/21; now 167 lbs, loss of 13 lbs (7%) Constitutional:  NAD General: frail appearing EYES: anicteric sclera, lids intact, no discharge  ENMT: intact hearing,oral mucous membranes moist CV: S1S2, RRR, no LE edema Pulmonary: LCTA, no increased work of breathing, no cough, no audible wheezes, room air Abdomen: intake 75%, normo-active BS +  4 quadrants, soft and non tender, no ascites GU: deferred MSK: mod sarcopenia, decreased ROM in all extremities, no contractures of LE, non ambulatory (needs  Heavy assist) Skin: warm and dry, no rashes or wounds on visible skin Neuro: Generalized weakness, mild cognitive impairment Psych: non-anxious affect, A and O x -32 Hem/lymph/immuno: no widespread bruising   Thank you for the opportunity to participate in the care of Ms. Marron.  The palliative care team will continue to follow. Please call our office at [phone] if we can be of additional assistance.  Jason Coop, NP , DNP,  MPH, AGPCNP-BC, ACHPN   COVID-19 PATIENT SCREENING TOOL  Person answering questions: _______staff____________   1.  Is the patient or any family member in the home showing any signs or symptoms regarding respiratory infection?                  Person with Symptom  ______________na___________ a. Fever/chills/headache  Yes___ No__X_            b. Shortness of breath                                                            Yes___ No__X_           c. Cough/congestion                                               Yes___  No__X_          d. Muscle/Body aches/pains                                                   Yes___ No__X_         e. Gastrointestinal symptoms (diarrhea,nausea)             Yes___ No__X_         f. Sudden loss of smell or taste      Yes___ No__X_        2. Within the past 10 days, has anyone living in the home had any contact with someone with or under investigation for COVID-19?    Yes___ No__X__   Person __________________  

## 2020-03-30 NOTE — Telephone Encounter (Signed)
Opened in error

## 2020-03-31 NOTE — Progress Notes (Signed)
Cardiology Office Note  Date:  04/02/2020   ID:  Amy [name], DOB [birth year], MRN [id number]  PCP:  Amy Hire, MD   Chief Complaint  Patient presents with  . OTHER    Medications verified with list provided by rehab facility    HPI:  Amy [name] is a pleasant 87 y.o. woman with a PMHx of:  Coronary Artery Disease  Proximal LAD with stent  COPD exacerbations Chronic baseline shortness of breath Morbid obesity Beta blocker has been held in the past secondary to bradycardia.  Off losartan secondary to hyperkalemia,  who now presents for routine follow up of her Coronary Artery Disease, leg weakness  Recent hospitalizations and events reviewed June -July 2021: PNA  In hospital 01/2021,  CHF  echo: EF 60-65% with grade 2 diastolic dysfunction and moderate to severe LVH.  treated with IV lasix 40-80 mg BID plus metolazone until 12/20  On torsemide  D/C 02/14/2020 Covid, in rehab  Suffered several seizures Back in hospital, d/c 03/08/20 Fall , Closed nondisplaced fracture of head of right radius Management recommended, doing PT, still significant pain MRI brain neg for acute CVA  Daughter presents with her today, reports it was recent Low BP , 80 systolic Several medications held Cardura dose decreased, HCTZ held  Followed by neurology, Amy [name] Scheduled for EEG  Labs reviewed Albumin 2.8 in 03/07/20 Anemia, HGB  Followed by oncology  Weight down 4 pounds from one year ago   Pain right arm, also shingles pain  Creatinine 1.4  EKG personally reviewed by myself on todays visit NSR rate 53 bpm, RBBB LPFB  Past medical history reviewed In the hospital June 2021 Weakness, loss of consciousness, Head pain with shingles at the time Troponin 733 felt to be demand ischemia   OTHER PAST MEDICAL HISTORY REVIEWED BY ME AT TODAY'S VISIT:  DATE TEST EF   09/2016 Echo  65-70 %   09/2015 Echo  55-60 %   02/2015 Echo 60-65% mild AS, LVH    2019 hospitalization March  2019 Sepsis due to pneumonia, upper GI bleed melena, acute on chronic diastolic CHF progressive shortness of breath with melena on arrival, pneumonia, treated with antibiotics pleural effusion felt secondary to CHF Asa plavix held prbc x1 HCT 25.7 D/c on plavix with no asa  Stress test 03/2017 Pharmacological myocardial perfusion imaging study with no significant Ischemia Small region of fixed apical thinning, consistent with attenuation artifact GI uptake artifact noted Normal wall motion, EF estimated at 56% No EKG changes concerning for ischemia at peak stress or in recovery. Low risk scan  2017 hospitalization mid August 2017 COPD exacerbation, required steroids, DuoNeb's, flutter device, antibiotics CT scan showed mucus plugging, postobstructive atelectasis  2015  [facility] August 2015. She had diarrhea, vomiting, felt to be viral also with acute renal failure that improved with hydration. Creatinine reached 1.86, one month later creatinine 1.0 as an outpatient Continues to be weak, does not walk much and is deconditioned  Guaiac positive and she had colonoscopy and EGD. She reports Amy [name] did not find anything Iron transfusions in the past under the direction of Amy [name].   Prior episode of shortness of breath while walking in the hospital to schedule a colonoscopy appointment. sent to the emergency room for further evaluation. Systolic pressure was in the 170 range. workup at that time was essentially normal and she was discharged home.  2013 01/14/2012 Stress test showed no ischemia, Lexiscan  2011 06/22/2009 Cardiac Catheter performed at  [place] details 90% proximal LAD, 40% mid LAD, 40% diagonal, 60% proximal circumflex followed by 40% circumflex lesion, 30%, 40% and 30% lesion noted in the RCA with distal RCA with 30% and 25% lesions. Mild atheroma in the aorta. Xience 2.75 x 12 mm Drug eluting stent placed.    PMH:   has a  past medical history of Bladder incontinence, CAD (coronary artery disease), Cancer ([place]), Carotid arterial disease w/ R Carotid Bruit (HCC), Chronic diastolic (congestive) heart failure (HCC), Chronic Dyspnea on exertion, CKD (chronic kidney disease) stage 3, GFR 30-59 ml/min (HCC), Degenerative arthritis of knee, Diabetes mellitus, GIB (gastrointestinal bleeding), Hiatal hernia, Hypertension, Iron deficiency, Menopausal symptoms, Morbid obesity ([place]), Renal insufficiency, and Thyroid disease.  PSH:    Past Surgical History:  Procedure Laterality Date  . COLONOSCOPY  2015  . COLONOSCOPY WITH PROPOFOL N/A 09/25/2016   Procedure: COLONOSCOPY WITH PROPOFOL;  Surgeon: Amy Bellows, MD;  Location: [facility] ENDOSCOPY;  Service: Gastroenterology;  Laterality: N/A;  . COLONOSCOPY WITH PROPOFOL N/A 09/26/2016   Procedure: COLONOSCOPY WITH PROPOFOL;  Surgeon: Amy Bellows, MD;  Location: [facility] ENDOSCOPY;  Service: Gastroenterology;  Laterality: N/A;  . ESOPHAGOGASTRODUODENOSCOPY (EGD) WITH PROPOFOL N/A 09/25/2016   Procedure: ESOPHAGOGASTRODUODENOSCOPY (EGD) WITH PROPOFOL;  Surgeon: Amy Bellows, MD;  Location: [facility] ENDOSCOPY;  Service: Gastroenterology;  Laterality: N/A;  . RENAL ANGIOGRAPHY N/A 02/07/2019   Procedure: RENAL ANGIOGRAPHY;  Surgeon: Amy Huxley, MD;  Location: [place] CV LAB;  Service: Cardiovascular;  Laterality: N/A;  . REPLACEMENT TOTAL KNEE BILATERAL    . TOTAL VAGINAL HYSTERECTOMY     ovarian mass, not cancerous  . UPPER GI ENDOSCOPY  2015    Current Outpatient Medications  Medication Sig Dispense Refill  . acetaminophen (TYLENOL) 500 MG tablet Take 1,000 mg by mouth 3 (three) times daily as needed for mild pain or moderate pain.    Marland Kitchen albuterol (VENTOLIN HFA) 108 (90 Base) MCG/ACT inhaler Inhale into the lungs every 6 (six) hours as needed for wheezing or shortness of breath.    Marland Kitchen amitriptyline (ELAVIL) 10 MG tablet Take 10 mg by mouth at bedtime.    . cloNIDine (CATAPRES) 0.1 MG tablet  Take 1 tablet (0.1 mg total) by mouth 2 (two) times daily. 60 tablet 11  . clopidogrel (PLAVIX) 75 MG tablet Take 1 tablet (75 mg total) by mouth daily. 90 tablet 3  . docusate sodium (COLACE) 100 MG capsule Take 1 capsule (100 mg total) by mouth 2 (two) times daily. 10 capsule 0  . doxazosin (CARDURA) 4 MG tablet Take 4 mg by mouth daily.    . iron polysaccharides (NIFEREX) 150 MG capsule Take 1 capsule (150 mg total) by mouth daily.    Marland Kitchen levETIRAcetam (KEPPRA) 500 MG tablet Take 1 tablet (500 mg total) by mouth 2 (two) times daily. 60 tablet 3  . levothyroxine (SYNTHROID) 88 MCG tablet 88 mcg daily before breakfast.     . lisinopril (ZESTRIL) 2.5 MG tablet Take 2.5 mg by mouth daily.    . melatonin 5 MG TABS Take 1 tablet (5 mg total) by mouth at bedtime as needed (sleep).  0  . montelukast (SINGULAIR) 10 MG tablet Take 10 mg by mouth at bedtime.     . nitroGLYCERIN (NITROSTAT) 0.4 MG SL tablet Place 1 tablet (0.4 mg total) under the tongue every 5 (five) minutes x 3 doses as needed for chest pain.  12  . pantoprazole (PROTONIX) 20 MG tablet Take 20 mg by mouth daily.     Marland Kitchen  polyethylene glycol (MIRALAX / GLYCOLAX) 17 g packet Take 17 g by mouth daily. 14 each 0  . pregabalin (LYRICA) 50 MG capsule Take 50-100 mg by mouth See admin instructions. Take 1 capsule (50mg ) by mouth every morning and take 2 capsules (100mg ) by mouth every night at bedtime    . simvastatin (ZOCOR) 40 MG tablet Take 1 tablet (40 mg total) by mouth at bedtime. 90 tablet 2  . SYMBICORT 160-4.5 MCG/ACT inhaler Inhale 2 puffs into the lungs 2 (two) times daily.     Marland Kitchen torsemide (DEMADEX) 20 MG tablet Take 20 mg by mouth daily.     No current facility-administered medications for this visit.   Facility-Administered Medications Ordered in Other Visits  Medication Dose Route Frequency Provider Last Rate Last Admin  . 0.9 %  sodium chloride infusion   Intravenous Continuous Lloyd Huger, MD      . epoetin alfa-epbx  (RETACRIT) injection 40,000 Units  40,000 Units Subcutaneous Once Lloyd Huger, MD         Allergies:   Atenolol, Codeine, Losartan, Morphine and related, Nsaids, Rofecoxib, Sucralfate, and Sulfa antibiotics   Social History:  The patient  reports that she has never smoked. She has never used smokeless tobacco. She reports that she does not drink alcohol and does not use drugs.   Family History:   family history includes Breast cancer (age of onset: 53) in her mother.    Review of Systems: Review of Systems  Constitutional: Negative.   HENT: Negative.   Respiratory: Positive for shortness of breath.   Cardiovascular: Positive for leg swelling.  Gastrointestinal: Negative.   Musculoskeletal: Negative.        Gait instability  Neurological: Negative.   Psychiatric/Behavioral: Negative.   All other systems reviewed and are negative.   PHYSICAL EXAM: VS:  BP (!) 130/40 (BP Location: Left Arm, Patient Position: Sitting, Cuff Size: Normal)   Pulse (!) 53   Ht 5\' 2"  (1.575 m)   Wt 172 lb (78 kg)   SpO2 92%   BMI 31.46 kg/m  , BMI Body mass index is 31.46 kg/m.  Constitutional:  oriented to person, place, and time. No distress.  HENT:  Head: Grossly normal Eyes:  no discharge. No scleral icterus.  Neck: No JVD, no carotid bruits  Cardiovascular: Regular rate and rhythm, no murmurs appreciated Pulmonary/Chest: Clear to auscultation bilaterally, no wheezes or rales Abdominal: Soft.  no distension.  no tenderness.  Musculoskeletal: Normal range of motion Right hand swelling, and a splint/support Neurological:  normal muscle tone. Coordination normal. No atrophy Skin: Skin warm and dry Psychiatric: normal affect, pleasant   Recent Labs: 02/02/2020: B Natriuretic Peptide 617.7 02/03/2020: TSH 19.762 03/07/2020: ALT 13; BUN 64; Creatinine, Ser 1.45; Hemoglobin 8.4; Magnesium 2.1; Platelets 139; Potassium 4.6; Sodium 142    Lipid Panel Lab Results  Component Value Date    CHOL 110 08/19/2019   HDL 37 (L) 08/19/2019   LDLCALC 62 08/19/2019   TRIG 55 08/19/2019      Wt Readings from Last 3 Encounters:  04/02/20 172 lb (78 kg)  03/07/20 187 lb 6.3 oz (85 kg)  02/14/20 188 lb 4.8 oz (85.4 kg)      ASSESSMENT AND PLAN:  Coronary artery disease of native artery of native heart with stable angina pectoris (HCC)  Currently with no symptoms of angina. No further workup at this time. Continue current medication regimen.  Essential hypertension  Records reviewed from nursing home, sometimes low blood  pressure In the setting of bradycardia, will need to be careful We will recommend splitting Cardura to 2 mg twice daily with hold parameters  Chronic diastolic CHF (congestive heart failure) ([place]) Appears euvolemic on today's visit, continue torsemide 20 mg daily  Paroxysmal atrial fibrillation (HCC)  High fall risk, several falls recently.  High risk of trauma.  Lives alone Not on anticoagulation Maintaining normal sinus rhythm  COPD exacerbation (HCC) Stable, Active  Acute renal failure superimposed on stage 3 chronic kidney disease, unspecified acute renal failure type ([place]) Followed by nephrology, stable renal function, underlying anemia  Anemia, unspecified type followed by hematology Dr. Grayland Ormond Periodic iron infusion  Total encounter time more than 35 minutes. Greater than 50% was spent in counseling and coordination of care with the patient.   Signed, Esmond Plants, M.D., Ph.D. 04/02/2020  Amy [name], [place]

## 2020-04-02 ENCOUNTER — Encounter: Payer: Self-pay | Admitting: Cardiovascular Disease

## 2020-04-02 ENCOUNTER — Other Ambulatory Visit: Payer: Self-pay

## 2020-04-02 ENCOUNTER — Ambulatory Visit (INDEPENDENT_AMBULATORY_CARE_PROVIDER_SITE_OTHER): Payer: Medicare Other | Admitting: Cardiovascular Disease

## 2020-04-02 VITALS — BP 130/40 | HR 53 | Ht 62.0 in | Wt 172.0 lb

## 2020-04-02 DIAGNOSIS — I480 Paroxysmal atrial fibrillation: Secondary | ICD-10-CM

## 2020-04-02 DIAGNOSIS — I701 Atherosclerosis of renal artery: Secondary | ICD-10-CM

## 2020-04-02 DIAGNOSIS — I10 Essential (primary) hypertension: Secondary | ICD-10-CM

## 2020-04-02 DIAGNOSIS — I2510 Atherosclerotic heart disease of native coronary artery without angina pectoris: Secondary | ICD-10-CM | POA: Diagnosis not present

## 2020-04-02 DIAGNOSIS — I5032 Chronic diastolic (congestive) heart failure: Secondary | ICD-10-CM | POA: Diagnosis not present

## 2020-04-02 DIAGNOSIS — N189 Chronic kidney disease, unspecified: Secondary | ICD-10-CM

## 2020-04-02 DIAGNOSIS — N183 Chronic kidney disease, stage 3 unspecified: Secondary | ICD-10-CM

## 2020-04-02 DIAGNOSIS — I25118 Atherosclerotic heart disease of native coronary artery with other forms of angina pectoris: Secondary | ICD-10-CM

## 2020-04-02 DIAGNOSIS — E1122 Type 2 diabetes mellitus with diabetic chronic kidney disease: Secondary | ICD-10-CM

## 2020-04-02 NOTE — Patient Instructions (Addendum)
Medication Instructions:   Please change Cardura to 2 mg twice daily, hold for systolic pressure less than 120  Please continue your current medications, there are no changes at this. If you need any refills on your cardiac medications, please reach out to your pharmacy, they will send in request.   Lab work: No new labs needed   Testing/Procedures: No new testing needed   Follow-Up: At [facility], you and your health needs are our priority.  As part of our continuing mission to provide you with exceptional heart care, we have created designated Provider Care Teams.  These Care Teams include your primary Cardiologist (physician) and Advanced Practice Providers (APPs -  Physician Assistants and Nurse Practitioners) who all work together to provide you with the care you need, when you need it.  . You will need a follow up appointment in 6 months  . Providers on your designated Care Team:   . Murray Hodgkins, NP . Christell Faith, PA-C . Marrianne Mood, PA-C

## 2020-04-03 ENCOUNTER — Ambulatory Visit: Payer: Medicare Other | Admitting: Family

## 2020-04-23 ENCOUNTER — Emergency Department: Payer: Medicare Other

## 2020-04-23 ENCOUNTER — Observation Stay
Admission: EM | Admit: 2020-04-23 | Discharge: 2020-04-24 | Disposition: A | Discharge status: Home / Self Care | Payer: Medicare Other | Attending: Emergency Medicine | Admitting: Emergency Medicine

## 2020-04-23 ENCOUNTER — Encounter: Payer: Self-pay | Admitting: Radiology

## 2020-04-23 DIAGNOSIS — Z96653 Presence of artificial knee joint, bilateral: Secondary | ICD-10-CM | POA: Insufficient documentation

## 2020-04-23 DIAGNOSIS — I5032 Chronic diastolic (congestive) heart failure: Secondary | ICD-10-CM | POA: Diagnosis not present

## 2020-04-23 DIAGNOSIS — S0003XA Contusion of scalp, initial encounter: Principal | ICD-10-CM | POA: Insufficient documentation

## 2020-04-23 DIAGNOSIS — S22039A Unspecified fracture of third thoracic vertebra, initial encounter for closed fracture: Secondary | ICD-10-CM | POA: Diagnosis present

## 2020-04-23 DIAGNOSIS — E039 Hypothyroidism, unspecified: Secondary | ICD-10-CM | POA: Insufficient documentation

## 2020-04-23 DIAGNOSIS — S42301A Unspecified fracture of shaft of humerus, right arm, initial encounter for closed fracture: Secondary | ICD-10-CM | POA: Diagnosis present

## 2020-04-23 DIAGNOSIS — R296 Repeated falls: Secondary | ICD-10-CM

## 2020-04-23 DIAGNOSIS — I4891 Unspecified atrial fibrillation: Secondary | ICD-10-CM | POA: Diagnosis not present

## 2020-04-23 DIAGNOSIS — S42301G Unspecified fracture of shaft of humerus, right arm, subsequent encounter for fracture with delayed healing: Secondary | ICD-10-CM

## 2020-04-23 DIAGNOSIS — N184 Chronic kidney disease, stage 4 (severe): Secondary | ICD-10-CM | POA: Diagnosis not present

## 2020-04-23 DIAGNOSIS — W19XXXA Unspecified fall, initial encounter: Secondary | ICD-10-CM | POA: Diagnosis present

## 2020-04-23 DIAGNOSIS — Z79899 Other long term (current) drug therapy: Secondary | ICD-10-CM | POA: Diagnosis not present

## 2020-04-23 DIAGNOSIS — I10 Essential (primary) hypertension: Secondary | ICD-10-CM | POA: Diagnosis present

## 2020-04-23 DIAGNOSIS — I130 Hypertensive heart and chronic kidney disease with heart failure and stage 1 through stage 4 chronic kidney disease, or unspecified chronic kidney disease: Secondary | ICD-10-CM | POA: Diagnosis not present

## 2020-04-23 DIAGNOSIS — R569 Unspecified convulsions: Secondary | ICD-10-CM

## 2020-04-23 DIAGNOSIS — E1122 Type 2 diabetes mellitus with diabetic chronic kidney disease: Secondary | ICD-10-CM | POA: Insufficient documentation

## 2020-04-23 DIAGNOSIS — I2510 Atherosclerotic heart disease of native coronary artery without angina pectoris: Secondary | ICD-10-CM | POA: Insufficient documentation

## 2020-04-23 DIAGNOSIS — U071 COVID-19: Secondary | ICD-10-CM | POA: Insufficient documentation

## 2020-04-23 DIAGNOSIS — E875 Hyperkalemia: Secondary | ICD-10-CM | POA: Diagnosis not present

## 2020-04-23 DIAGNOSIS — D509 Iron deficiency anemia, unspecified: Secondary | ICD-10-CM | POA: Diagnosis present

## 2020-04-23 DIAGNOSIS — Y92129 Unspecified place in nursing home as the place of occurrence of the external cause: Secondary | ICD-10-CM | POA: Diagnosis not present

## 2020-04-23 DIAGNOSIS — I480 Paroxysmal atrial fibrillation: Secondary | ICD-10-CM | POA: Diagnosis present

## 2020-04-23 DIAGNOSIS — E785 Hyperlipidemia, unspecified: Secondary | ICD-10-CM | POA: Diagnosis present

## 2020-04-23 DIAGNOSIS — I959 Hypotension, unspecified: Secondary | ICD-10-CM | POA: Diagnosis present

## 2020-04-23 LAB — POTASSIUM
Potassium: 5.9 mmol/L — ABNORMAL HIGH (ref 3.5–5.1)
Potassium: 6.9 mmol/L (ref 3.5–5.1)
Potassium: 5.7 mmol/L — ABNORMAL HIGH (ref 3.5–5.1)

## 2020-04-23 LAB — CBC WITH DIFFERENTIAL/PLATELET
Abs Immature Granulocytes: 0.05 10*3/uL (ref 0.00–0.07)
Basophils Absolute: 0 10*3/uL (ref 0.0–0.1)
Basophils Relative: 0 %
Eosinophils Absolute: 0.6 10*3/uL — ABNORMAL HIGH (ref 0.0–0.5)
Eosinophils Relative: 7 %
HCT: 29.6 % — ABNORMAL LOW (ref 36.0–46.0)
Hemoglobin: 9.8 g/dL — ABNORMAL LOW (ref 12.0–15.0)
Immature Granulocytes: 1 %
Lymphocytes Relative: 18 %
Lymphs Abs: 1.5 10*3/uL (ref 0.7–4.0)
MCH: 31.7 pg (ref 26.0–34.0)
MCHC: 33.1 g/dL (ref 30.0–36.0)
MCV: 95.8 fL (ref 80.0–100.0)
Monocytes Absolute: 0.7 10*3/uL (ref 0.1–1.0)
Monocytes Relative: 8 %
Neutro Abs: 5.4 10*3/uL (ref 1.7–7.7)
Neutrophils Relative %: 66 %
Platelets: 174 10*3/uL (ref 150–400)
RBC: 3.09 MIL/uL — ABNORMAL LOW (ref 3.87–5.11)
RDW: 15.6 % — ABNORMAL HIGH (ref 11.5–15.5)
WBC: 8.3 10*3/uL (ref 4.0–10.5)
nRBC: 0 % (ref 0.0–0.2)

## 2020-04-23 LAB — COMPREHENSIVE METABOLIC PANEL
ALT: 11 U/L (ref 0–44)
AST: 17 U/L (ref 15–41)
Albumin: 3.1 g/dL — ABNORMAL LOW (ref 3.5–5.0)
Alkaline Phosphatase: 74 U/L (ref 38–126)
Anion gap: 8 (ref 5–15)
BUN: 69 mg/dL — ABNORMAL HIGH (ref 8–23)
CO2: 22 mmol/L (ref 22–32)
Calcium: 8.7 mg/dL — ABNORMAL LOW (ref 8.9–10.3)
Chloride: 108 mmol/L (ref 98–111)
Creatinine, Ser: 1.78 mg/dL — ABNORMAL HIGH (ref 0.44–1.00)
GFR, Estimated: 27 mL/min — ABNORMAL LOW (ref >60)
Glucose, Bld: 89 mg/dL (ref 70–99)
Potassium: 6.1 mmol/L — ABNORMAL HIGH (ref 3.5–5.1)
Sodium: 138 mmol/L (ref 135–145)
Total Bilirubin: 0.7 mg/dL (ref 0.3–1.2)
Total Protein: 5.8 g/dL — ABNORMAL LOW (ref 6.5–8.1)

## 2020-04-23 LAB — RESP PANEL BY RT-PCR (FLU A&B, COVID) ARPGX2
Influenza A by PCR: NEGATIVE
Influenza B by PCR: NEGATIVE
SARS Coronavirus 2 by RT PCR: POSITIVE — AB

## 2020-04-23 LAB — BRAIN NATRIURETIC PEPTIDE: B Natriuretic Peptide: 370.5 pg/mL — ABNORMAL HIGH (ref 0.0–100.0)

## 2020-04-23 MED ORDER — LEVOTHYROXINE SODIUM 88 MCG PO TABS
88.0000 ug | ORAL_TABLET | Freq: Every day | ORAL | Status: DC
  Administered 2020-04-24: 88 ug via ORAL
  Filled 2020-04-23 (×2): qty 1

## 2020-04-23 MED ORDER — MELATONIN 5 MG PO TABS: 5.0000 mg | ORAL_TABLET | Freq: Every evening | ORAL | Status: DC | PRN

## 2020-04-23 MED ORDER — CALCIUM GLUCONATE-NACL 1-0.675 GM/50ML-% IV SOLN
1.0000 g | Freq: Once | INTRAVENOUS | Status: AC
  Administered 2020-04-23: 1000 mg via INTRAVENOUS
  Filled 2020-04-23: qty 50

## 2020-04-23 MED ORDER — INSULIN ASPART 100 UNIT/ML IV SOLN
10.0000 [IU] | Freq: Once | INTRAVENOUS | Status: DC
  Filled 2020-04-23: qty 0.1

## 2020-04-23 MED ORDER — LEVETIRACETAM 500 MG PO TABS
500.0000 mg | ORAL_TABLET | Freq: Two times a day (BID) | ORAL | Status: DC
  Administered 2020-04-23 – 2020-04-24 (×3): 500 mg via ORAL
  Filled 2020-04-23 (×4): qty 1

## 2020-04-23 MED ORDER — HYDROCODONE-ACETAMINOPHEN 5-325 MG PO TABS
1.0000 | ORAL_TABLET | Freq: Four times a day (QID) | ORAL | Status: DC | PRN
Start: 2020-04-23 — End: 2020-04-24

## 2020-04-23 MED ORDER — SODIUM CHLORIDE 0.9 % IV BOLUS
1000.0000 mL | Freq: Once | INTRAVENOUS | Status: AC
  Administered 2020-04-23: 1000 mL via INTRAVENOUS

## 2020-04-23 MED ORDER — FENTANYL CITRATE (PF) 100 MCG/2ML IJ SOLN: 12.5000 ug | INTRAMUSCULAR | Status: DC | PRN

## 2020-04-23 MED ORDER — MOMETASONE FURO-FORMOTEROL FUM 200-5 MCG/ACT IN AERO
2.0000 | INHALATION_SPRAY | Freq: Two times a day (BID) | RESPIRATORY_TRACT | Status: DC
  Administered 2020-04-23 – 2020-04-24 (×2): 2 via RESPIRATORY_TRACT
  Filled 2020-04-23: qty 8.8

## 2020-04-23 MED ORDER — CALCIUM GLUCONATE-NACL 1-0.675 GM/50ML-% IV SOLN
1.0000 g | Freq: Once | INTRAVENOUS | Status: DC
  Filled 2020-04-23: qty 50

## 2020-04-23 MED ORDER — HYDRALAZINE HCL 20 MG/ML IJ SOLN
5.0000 mg | INTRAMUSCULAR | Status: DC | PRN
  Administered 2020-04-24: 5 mg via INTRAVENOUS
  Filled 2020-04-23 (×2): qty 1

## 2020-04-23 MED ORDER — SODIUM ZIRCONIUM CYCLOSILICATE 10 G PO PACK
10.0000 g | PACK | Freq: Two times a day (BID) | ORAL | Status: DC
  Administered 2020-04-24 (×2): 10 g via ORAL
  Filled 2020-04-23 (×3): qty 1

## 2020-04-23 MED ORDER — DEXTROSE 50 % IV SOLN
1.0000 | Freq: Once | INTRAVENOUS | Status: AC
  Administered 2020-04-23: 50 mL via INTRAVENOUS
  Filled 2020-04-23: qty 50

## 2020-04-23 MED ORDER — LORAZEPAM 2 MG/ML IJ SOLN: 1.0000 mg | INTRAMUSCULAR | Status: DC | PRN

## 2020-04-23 MED ORDER — AMITRIPTYLINE HCL 10 MG PO TABS
10.0000 mg | ORAL_TABLET | Freq: Every day | ORAL | Status: DC
  Administered 2020-04-23: 10 mg via ORAL
  Filled 2020-04-23 (×2): qty 1

## 2020-04-23 MED ORDER — PREGABALIN 50 MG PO CAPS
100.0000 mg | ORAL_CAPSULE | Freq: Every day | ORAL | Status: DC
  Administered 2020-04-23: 100 mg via ORAL
  Filled 2020-04-23: qty 2

## 2020-04-23 MED ORDER — ONDANSETRON HCL 4 MG/2ML IJ SOLN: 4.0000 mg | Freq: Three times a day (TID) | INTRAMUSCULAR | Status: DC | PRN

## 2020-04-23 MED ORDER — ALBUTEROL SULFATE HFA 108 (90 BASE) MCG/ACT IN AERS
2.0000 | INHALATION_SPRAY | RESPIRATORY_TRACT | Status: DC | PRN
  Filled 2020-04-23: qty 6.7

## 2020-04-23 MED ORDER — METHOCARBAMOL 500 MG PO TABS
500.0000 mg | ORAL_TABLET | Freq: Three times a day (TID) | ORAL | Status: DC | PRN
  Filled 2020-04-23: qty 1

## 2020-04-23 MED ORDER — DM-GUAIFENESIN ER 30-600 MG PO TB12
1.0000 | ORAL_TABLET | Freq: Two times a day (BID) | ORAL | Status: DC | PRN
  Administered 2020-04-24: 1 via ORAL
  Filled 2020-04-23 (×2): qty 1

## 2020-04-23 MED ORDER — FENTANYL CITRATE (PF) 100 MCG/2ML IJ SOLN
50.0000 ug | Freq: Once | INTRAMUSCULAR | Status: DC
  Filled 2020-04-23: qty 2

## 2020-04-23 MED ORDER — ACETAMINOPHEN 325 MG PO TABS: 650.0000 mg | ORAL_TABLET | Freq: Four times a day (QID) | ORAL | Status: DC | PRN

## 2020-04-23 MED ORDER — PREGABALIN 50 MG PO CAPS
50.0000 mg | ORAL_CAPSULE | Freq: Every day | ORAL | Status: DC
  Administered 2020-04-24: 50 mg via ORAL
  Filled 2020-04-23: qty 1

## 2020-04-23 MED ORDER — INSULIN ASPART 100 UNIT/ML ~~LOC~~ SOLN
10.0000 [IU] | Freq: Once | SUBCUTANEOUS | Status: AC
  Administered 2020-04-23: 10 [IU] via INTRAVENOUS
  Filled 2020-04-23: qty 1

## 2020-04-23 MED ORDER — KETOROLAC TROMETHAMINE 30 MG/ML IJ SOLN
15.0000 mg | Freq: Once | INTRAMUSCULAR | Status: AC
  Administered 2020-04-23: 15 mg via INTRAVENOUS
  Filled 2020-04-23: qty 1

## 2020-04-23 MED ORDER — POLYSACCHARIDE IRON COMPLEX 150 MG PO CAPS
150.0000 mg | ORAL_CAPSULE | Freq: Every day | ORAL | Status: DC
  Administered 2020-04-24: 150 mg via ORAL
  Filled 2020-04-23 (×2): qty 1

## 2020-04-23 MED ORDER — MONTELUKAST SODIUM 10 MG PO TABS
10.0000 mg | ORAL_TABLET | Freq: Every day | ORAL | Status: DC
  Administered 2020-04-23: 10 mg via ORAL
  Filled 2020-04-23: qty 1

## 2020-04-23 MED ORDER — SODIUM ZIRCONIUM CYCLOSILICATE 10 G PO PACK
10.0000 g | PACK | Freq: Once | ORAL | Status: AC
  Administered 2020-04-23: 10 g via ORAL
  Filled 2020-04-23: qty 1

## 2020-04-23 MED ORDER — SIMVASTATIN 20 MG PO TABS
40.0000 mg | ORAL_TABLET | Freq: Every day | ORAL | Status: DC
  Administered 2020-04-23: 40 mg via ORAL
  Filled 2020-04-23: qty 2

## 2020-04-23 MED ORDER — PANTOPRAZOLE SODIUM 20 MG PO TBEC
20.0000 mg | DELAYED_RELEASE_TABLET | Freq: Every day | ORAL | Status: DC
  Administered 2020-04-24: 20 mg via ORAL
  Filled 2020-04-23: qty 1

## 2020-04-23 MED ORDER — ONDANSETRON HCL 4 MG/2ML IJ SOLN
4.0000 mg | INTRAMUSCULAR | Status: AC
  Administered 2020-04-23: 4 mg via INTRAVENOUS
  Filled 2020-04-23 (×2): qty 2

## 2020-04-23 MED ORDER — PREGABALIN 50 MG PO CAPS: 50.0000 mg | ORAL_CAPSULE | ORAL | Status: DC

## 2020-04-23 MED ORDER — DEXTROSE 50 % IV SOLN: 1.0000 | Freq: Once | INTRAVENOUS | Status: DC

## 2020-04-23 NOTE — ED Notes (Signed)
Pt given water to drink. Pt placed on 2L of O2.

## 2020-04-23 NOTE — ED Notes (Signed)
ekg done and sent to DR [facility]

## 2020-04-23 NOTE — ED Notes (Signed)
DR Blaine Hamper notified of current BP. NS bolus started.

## 2020-04-23 NOTE — ED Triage Notes (Signed)
87 y/o female arrived to the [facility] via EMS coming [facility] with a CC of a fall and right arm pain. EMS states pt fell at facility unwitnessed. Upon assessment pt has hematoma to the back of the head. Bleeding is controlled at this time. Pt medication history doesn't show pt taking any blood thinners. Pt also complains of right arm pain. Pt has a history of having a broken arm in the recent past. Upon assessment pt doesn't have any obvious deformities.Pt has dementia at baseline. Pt is alert upon to arrival to room 10 in the [facility]

## 2020-04-23 NOTE — ED Notes (Signed)
Admitting md at bedside. Pt given water to drink.

## 2020-04-23 NOTE — H&P (Signed)
History and Physical    Amy [name] HQI:[id number] DOB: April 29, 1927 DOA: 04/23/2020  Referring MD/NP/PA:   PCP: [name], MD   Patient coming from:  The patient is coming from SNF.  At baseline, pt is dependent for most of ADL.        Chief Complaint: fall  HPI: Amy [name] is a 87 y.o. female with medical history significant of dCHF, seizure, dementia, CAD with DES, HTN ,HLD, DM, COPD, GERD, hypothyroidism, depression, iron deficiency anemia, GI bleeding, CKD-4, a fib not on AC, covid19 infecion 03/06/20, recent completed right arm fracture with conservative treatment (right radial neck and right humerus neck fracture), presents with fall.   Per report, pt had unwitnessed fall in SNF. She developed hematoma in the back of her head. She has hx of complicated right arm fracture with conservative treatment currently. Pt is following up with Dr. Roland Rack of ortho. She complains of right arm pain.  Per her daughter, patient has mild cough, but no chest pain, shortness of breath.  Patient had diarrhea in facility which is likely due to laxative use.  Patient is taking MiraLAX and Colace in facility.  No nausea, vomiting or abdominal pain.  Denies symptoms of UTI.  No unilateral numbness or changes.  Patient's initial blood pressure 177/46, 138/41, which dropped to 74/38 after treated with IV fentanyl which improved to 124/43 after giving 1 liter of normal saline bolus  ED Course: pt was found to have WBC 8.3, pending COVID-19 PCR, potassium 6.1, 5.9, renal function close to recent baseline, temperature 97.4, heart rate 59 --> 40s, RR 19, oxygen saturation 95% on room air.  Patient is placed on MedSurg bed for observation.  CT-head: 1. Right posterior scalp hematoma without underlying skull fracture. 2.  Stable noncontrast CT appearance of the brain.  CT of C spine: 1. Subtle new T3 superior endplate compression deformity is new since a chest CT last year but age indeterminate.  See sagittal image 39. 2. No acute traumatic injury identified in the cervical spine. Advanced cervical spine degeneration appears stable since last year.  X-ray of right humerus: 1. Acute complete fracture involving the anatomic neck of the right humerus with foreshortening and displacement of the humerus anteriorly. 2. Apparent deformity involving the neck of the radius, potentially artifactual due to technique and large field of view, though a concomitant radial head fracture could have a similar appearance. Correlation for point tenderness at this location is advised. If patient is able to tolerate positioning, further evaluation with dedicated elbow radiographic series and/or elbow CT could be obtained as indicated.  X-ray of right shoulder: 1. Complete fracture involving the anatomic neck of the right humerus with foreshortening and displacement of the humerus anteriorly. 2. Linear approximately 1.9 x 0.3 cm calcification about the superior aspect of the glenohumeral joint may represent additional avulsed fracture fragment.      Review of Systems: Could not be reviewed due to dementia  Allergy:  Allergies  Allergen Reactions  . Atenolol Other (See Comments)    Other reaction(s): Other (See Comments) Decreased heart rate Decreased heart rate  . Codeine Shortness Of Breath    Rash, difficulty breathing, nausea.  . Losartan Other (See Comments)    Hyperkalemia  . Morphine And Related Shortness Of Breath    Rash, difficulty breathing, nausea.   . Nsaids Other (See Comments)    Other reaction(s): Unknown  . Rofecoxib     Other reaction(s): Unknown  . Sucralfate Other (  See Comments)    Throat tightness  . Sulfa Antibiotics     Other reaction(s): Unknown    Past Medical History:  Diagnosis Date  . Bladder incontinence   . CAD (coronary artery disease)    a. 05/2009 Cath: LAD 90p (Xience 2.75 x 12 mm DES), 81m, D1 40, LCx 40p/m, RCA 30/40/30p/m, RCA 30/25d;  b.   12/2011 Lexiscan MV: no ischemia, breast attenuation artifact, normal EF-->Low risk; c. 10/2016 MV: fixed apical defect, most likely apical thinning and attenuation, No ischemia, EF 60%.  . Cancer ([place])    ovarian  . Carotid arterial disease w/ R Carotid Bruit (HCC)    a. 08/2016 Carotid U/S: 40-59% bilat ICA stenosis - f/u 1 yr.  . Chronic diastolic (congestive) heart failure ([place])    a. Echo 09/2015: EF 55-60% w/ Grade 1 DD, sev Ca2+ MV annulus, mildly dil LA; b. 09/2016 Echo: EF 65-70%, Gr2 DD, mildly dil LA/RA, nl RV fxn.  . Chronic Dyspnea on exertion   . CKD (chronic kidney disease) stage 3, GFR 30-59 ml/min (HCC)   . Degenerative arthritis of knee    bilateral knees  . Diabetes mellitus    Type II  . GIB (gastrointestinal bleeding)    a. 08/2016 Admission w/ presyncope/anemia/melena-->req Transfusion-->endo ok, colonoscopy w/ polyps but no source of bleeding (most likely diverticular).  . Hiatal hernia   . Hypertension   . Iron deficiency   . Menopausal symptoms   . Morbid obesity ([place])   . Renal insufficiency   . Thyroid disease    hypothyroidism    Past Surgical History:  Procedure Laterality Date  . COLONOSCOPY  2015  . COLONOSCOPY WITH PROPOFOL N/A 09/25/2016   Procedure: COLONOSCOPY WITH PROPOFOL;  Surgeon: [name], MD;  Location: [facility] ENDOSCOPY;  Service: Gastroenterology;  Laterality: N/A;  . COLONOSCOPY WITH PROPOFOL N/A 09/26/2016   Procedure: COLONOSCOPY WITH PROPOFOL;  Surgeon: [name], MD;  Location: [facility] ENDOSCOPY;  Service: Gastroenterology;  Laterality: N/A;  . ESOPHAGOGASTRODUODENOSCOPY (EGD) WITH PROPOFOL N/A 09/25/2016   Procedure: ESOPHAGOGASTRODUODENOSCOPY (EGD) WITH PROPOFOL;  Surgeon: [name], MD;  Location: [facility] ENDOSCOPY;  Service: Gastroenterology;  Laterality: N/A;  . RENAL ANGIOGRAPHY N/A 02/07/2019   Procedure: RENAL ANGIOGRAPHY;  Surgeon: [name], MD;  Location: [facility] CV LAB;  Service: Cardiovascular;  Laterality: N/A;  .  REPLACEMENT TOTAL KNEE BILATERAL    . TOTAL VAGINAL HYSTERECTOMY     ovarian mass, not cancerous  . UPPER GI ENDOSCOPY  2015    Social History:  reports that she has never smoked. She has never used smokeless tobacco. She reports that she does not drink alcohol and does not use drugs.  Family History:  Family History  Problem Relation Age of Onset  . Breast cancer Mother 43     Prior to Admission medications   Medication Sig Start Date End Date Taking? Authorizing Provider  acetaminophen (TYLENOL) 500 MG tablet Take 1,000 mg by mouth 3 (three) times daily as needed for mild pain or moderate pain.    [provider]  albuterol (VENTOLIN HFA) 108 (90 Base) MCG/ACT inhaler Inhale into the lungs every 6 (six) hours as needed for wheezing or shortness of breath.    [provider]  amitriptyline (ELAVIL) 10 MG tablet Take 10 mg by mouth at bedtime. 12/04/19   [provider]  cloNIDine (CATAPRES) 0.1 MG tablet Take 1 tablet (0.1 mg total) by mouth 2 (two) times daily. 02/14/20   Enzo Bi, MD  clopidogrel (PLAVIX) 75 MG tablet Take 1 tablet (75 mg total) by mouth daily. 09/27/16   Dustin Flock, MD  docusate sodium (COLACE) 100 MG capsule Take 1 capsule (100 mg total) by mouth 2 (two) times daily. 03/08/20   Fritzi Mandes, MD  doxazosin (CARDURA) 4 MG tablet Take 4 mg by mouth daily. 03/27/20   [provider]  iron polysaccharides (NIFEREX) 150 MG capsule Take 1 capsule (150 mg total) by mouth daily. 02/15/20   Enzo Bi, MD  levETIRAcetam (KEPPRA) 500 MG tablet Take 1 tablet (500 mg total) by mouth 2 (two) times daily. 03/08/20   Fritzi Mandes, MD  levothyroxine (SYNTHROID) 88 MCG tablet 88 mcg daily before breakfast.  06/20/19   [provider]  lisinopril (ZESTRIL) 2.5 MG tablet Take 2.5 mg by mouth daily. 01/10/20 01/09/21  [provider]  melatonin 5 MG TABS Take 1 tablet (5 mg total) by mouth at bedtime as needed (sleep). 02/14/20   Enzo Bi, MD  montelukast (SINGULAIR) 10 MG tablet Take 10 mg by mouth at bedtime.  03/24/19   [provider]  nitroGLYCERIN (NITROSTAT) 0.4 MG SL tablet Place 1 tablet (0.4 mg total) under the tongue every 5 (five) minutes x 3 doses as needed for chest pain. 08/23/19   Enzo Bi, MD  pantoprazole (PROTONIX) 20 MG tablet Take 20 mg by mouth daily.     [provider]  polyethylene glycol (MIRALAX / GLYCOLAX) 17 g packet Take 17 g by mouth daily. 03/08/20   Fritzi Mandes, MD  pregabalin (LYRICA) 50 MG capsule Take 50-100 mg by mouth See admin instructions. Take 1 capsule (50mg ) by mouth every morning and take 2 capsules (100mg ) by mouth every night at bedtime    [provider]  simvastatin (ZOCOR) 40 MG tablet Take 1 tablet (40 mg total) by mouth at bedtime. 03/22/18   Minna Merritts, MD  SYMBICORT 160-4.5 MCG/ACT inhaler Inhale 2 puffs into the lungs 2 (two) times daily.  11/15/14   [provider]  torsemide (DEMADEX) 20 MG tablet Take 20 mg by mouth daily.    [provider]    Physical Exam: Vitals:   04/23/20 1615 04/23/20 1630 04/23/20 1645 04/23/20 1700  BP: (!) 169/132 (!) 175/37 (!) 153/29 (!) 142/68  Pulse: (!) 43 (!) 46 (!) 45 (!) 43  Resp: 16 15 14 12   Temp:      TempSrc:      SpO2: 99% 100% 97% 100%  Weight:       General: Not in acute distress HEENT: Has a hematoma in the back of her right head       Eyes: PERRL, EOMI, no scleral icterus.       ENT: No discharge from the ears and nose      Neck: No JVD, no bruit, no mass felt. Heme: No neck lymph node enlargement. Cardiac: S1/S2, RRR, No murmurs, No gallops or rubs. Respiratory: No rales, wheezing, rhonchi or rubs. GI: Soft, nondistended, nontender, no organomegaly, BS present. GU: No hematuria Ext: trace pitting leg edema bilaterally. 1+DP/PT pulse bilaterally. Musculoskeletal: Has right arm pain Skin: No rashes.  Neuro: alert, knows her own name, not oriented place and time,  cranial nerves II-XII grossly intact, moves all extremities  Psych: Patient is not psychotic, no suicidal or hemocidal ideation.  Labs on Admission: I have personally reviewed following labs and imaging studies  CBC: Recent Labs  Lab 04/23/20 0530  WBC 8.3  NEUTROABS 5.4  HGB  9.8*  HCT 29.6*  MCV 95.8  PLT 076   Basic Metabolic Panel: Recent Labs  Lab 04/23/20 0530 04/23/20 0715  NA 138  --   K 6.1* 5.9*  CL 108  --   CO2 22  --   GLUCOSE 89  --   BUN 69*  --   CREATININE 1.78*  --   CALCIUM 8.7*  --    GFR: Estimated Creatinine Clearance: 20.4 mL/min (A) (by C-G formula based on SCr of 1.78 mg/dL (H)). Liver Function Tests: Recent Labs  Lab 04/23/20 0530  AST 17  ALT 11  ALKPHOS 74  BILITOT 0.7  PROT 5.8*  ALBUMIN 3.1*   No results for input(s): LIPASE, AMYLASE in the last 168 hours. No results for input(s): AMMONIA in the last 168 hours. Coagulation Profile: No results for input(s): INR, PROTIME in the last 168 hours. Cardiac Enzymes: No results for input(s): CKTOTAL, CKMB, CKMBINDEX, TROPONINI in the last 168 hours. BNP (last 3 results) No results for input(s): PROBNP in the last 8760 hours. HbA1C: No results for input(s): HGBA1C in the last 72 hours. CBG: No results for input(s): GLUCAP in the last 168 hours. Lipid Profile: No results for input(s): CHOL, HDL, LDLCALC, TRIG, CHOLHDL, LDLDIRECT in the last 72 hours. Thyroid Function Tests: No results for input(s): TSH, T4TOTAL, FREET4, T3FREE, THYROIDAB in the last 72 hours. Anemia Panel: No results for input(s): VITAMINB12, FOLATE, FERRITIN, TIBC, IRON, RETICCTPCT in the last 72 hours. Urine analysis:    Component Value Date/Time   COLORURINE YELLOW (A) 08/18/2019 0745   APPEARANCEUR CLEAR (A) 08/18/2019 0745   APPEARANCEUR Cloudy (A) 03/15/2019 0909   LABSPEC 1.014 08/18/2019 0745   LABSPEC 1.025 10/11/2013 1637   PHURINE 5.0 08/18/2019 0745   GLUCOSEU NEGATIVE 08/18/2019 0745   GLUCOSEU  Negative 10/11/2013 1637   HGBUR NEGATIVE 08/18/2019 0745   BILIRUBINUR NEGATIVE 08/18/2019 0745   BILIRUBINUR Negative 03/15/2019 0909   BILIRUBINUR Negative 10/11/2013 1637   KETONESUR NEGATIVE 08/18/2019 0745   PROTEINUR NEGATIVE 08/18/2019 0745   NITRITE NEGATIVE 08/18/2019 0745   LEUKOCYTESUR NEGATIVE 08/18/2019 0745   LEUKOCYTESUR Negative 10/11/2013 1637   Sepsis Labs: @LABRCNTIP (procalcitonin:4,lacticidven:4) ) Recent Results (from the past 240 hour(s))  Resp Panel by RT-PCR (Flu A&B, Covid) Nasopharyngeal Swab     Status: Abnormal   Collection Time: 04/23/20  9:49 AM   Specimen: Nasopharyngeal Swab; Nasopharyngeal(NP) swabs in vial transport medium  Result Value Ref Range Status   SARS Coronavirus 2 by RT PCR POSITIVE (A) NEGATIVE Final    Comment: RESULT CALLED TO, READ BACK BY AND VERIFIED WITH: S.ALLISON,RN [id number] 04/23/20 GM (NOTE) SARS-CoV-2 target nucleic acids are DETECTED.  The SARS-CoV-2 RNA is generally detectable in upper respiratory specimens during the acute phase of infection. Positive results are indicative of the presence of the identified virus, but do not rule out bacterial infection or co-infection with other pathogens not detected by the test. Clinical correlation with patient history and other diagnostic information is necessary to determine patient infection status. The expected result is Negative.  Fact Sheet for Patients: EntrepreneurPulse.com.au  Fact Sheet for Healthcare Providers: IncredibleEmployment.be  This test is not yet approved or cleared by the [place] FDA and  has been authorized for detection and/or diagnosis of SARS-CoV-2 by FDA under an Emergency Use Authorization (EUA).  This EUA will remain in effect (meaning this test can be used ) for the duration of  the COVID-19 declaration under Section 564(b)(1) of the Act, 21 U.S.C. section  360bbb-3(b)(1), unless the authorization is terminated  or revoked sooner.     Influenza A by PCR NEGATIVE NEGATIVE Final   Influenza B by PCR NEGATIVE NEGATIVE Final    Comment: (NOTE) The Xpert Xpress SARS-CoV-2/FLU/RSV plus assay is intended as an aid in the diagnosis of influenza from Nasopharyngeal swab specimens and should not be used as a sole basis for treatment. Nasal washings and aspirates are unacceptable for Xpert Xpress SARS-CoV-2/FLU/RSV testing.  Fact Sheet for Patients: EntrepreneurPulse.com.au  Fact Sheet for Healthcare Providers: IncredibleEmployment.be  This test is not yet approved or cleared by the [place] FDA and has been authorized for detection and/or diagnosis of SARS-CoV-2 by FDA under an Emergency Use Authorization (EUA). This EUA will remain in effect (meaning this test can be used) for the duration of the COVID-19 declaration under Section 564(b)(1) of the Act, 21 U.S.C. section 360bbb-3(b)(1), unless the authorization is terminated or revoked.  Performed at [name], [address] [place], [place] [address]      Radiological Exams on Admission: DG Shoulder Right  Result Date: 04/23/2020 CLINICAL DATA:  Post fall, now with right pain. EXAM: RIGHT SHOULDER - 2+ VIEW COMPARISON:  Right humerus radiographs-earlier same day FINDINGS: There is a complete fracture involving the anatomic neck of the right humerus with foreshortening and displacement of the humerus anteriorly. Additionally, there is a linear approximately 1.9 x 0.3 cm calcification about the superior aspect of the glenohumeral joint which may represent additional avulsed fracture. [place] about the anterior inferior aspect the glenohumeral joint may represent age-indeterminate loose bodies. Suspected glenohumeral joint effusion. Normal appearance of the adjacent AC joint. Limited visualization of the adjacent thorax is normal. Expected adjacent soft tissue swelling. No radiopaque body.  IMPRESSION: 1. Complete fracture involving the anatomic neck of the right humerus with foreshortening and displacement of the humerus anteriorly. 2. Linear approximately 1.9 x 0.3 cm calcification about the superior aspect of the glenohumeral joint may represent additional avulsed fracture fragment. Electronically Signed   By: Sandi Mariscal M.D.   On: 04/23/2020 07:20   CT Head Wo Contrast  Result Date: 04/23/2020 CLINICAL DATA:  87 year old female status post fall, unwitnessed. Posterior head hematoma. Pain. COMPARISON:  Brain MRI 03/07/2020.  Head CT 03/06/2020. FINDINGS: Sinuses: Paranasal sinuses, tympanic cavities and mastoids are stable and well aerated. Scalp, orbits: Broad-based right posterior convexity scalp hematoma (series 3, image 57). No scalp soft tissue gas. Underlying calvarium appears intact. Elsewhere stable orbit and scalp soft tissues. Skull: Stable contact. Vascular: Calcified atherosclerosis at the skull base. No suspicious vascular hyperdensity. Brain: Stable cerebral volume. No intracranial mass effect, midline shift or ventriculomegaly. No acute intracranial hemorrhage or cortically based infarct identified. Stable gray-white matter differentiation, patchy bilateral white matter hypodensity. Evidence of a small chronic lacunar infarct in the left cerebellum. IMPRESSION: 1. Right posterior scalp hematoma without underlying skull fracture. 2.  Stable noncontrast CT appearance of the brain. Electronically Signed   By: Genevie Ann M.D.   On: 04/23/2020 06:48   CT Cervical Spine Wo Contrast  Result Date: 04/23/2020 CLINICAL DATA:  87 year old female status post fall, unwitnessed. Posterior head hematoma. Pain. Report accidentally delayed due to IT downtime. EXAM: CT CERVICAL SPINE WITHOUT CONTRAST TECHNIQUE: Multidetector CT imaging of the cervical spine was performed without intravenous contrast. Multiplanar CT image reconstructions were also generated. COMPARISON:  Head CT today reported  separately. Cervical spine CT 08/18/2019. Chest CT 08/06/2019. FINDINGS: Alignment: Stable straightening of cervical lordosis. Stable cervicothoracic junction alignment. Mild anterolisthesis.  Bilateral posterior element alignment is within normal limits. Skull base and vertebrae: Visualized skull base is intact. No atlanto-occipital dissociation. No acute osseous abnormality identified. Soft tissues and spinal canal: No prevertebral fluid or swelling. No visible canal hematoma. Calcified carotid atherosclerosis in the neck. Otherwise negative visible noncontrast neck soft tissues. Disc levels: Advanced cervical spine degeneration at C5-C6 and C6-C7. Severe hypertrophy but also chronic ankylosis of the left C3-C4 facets. Degeneration appears stable since last year. Upper chest: There is a subtle new T3 superior endplate compression deformity when compared to the chest CT last year. See sagittal image 39. Otherwise visible upper thoracic levels appear stable. Thoracic osteopenia. Mild apical lung scarring.  Calcified great vessel atherosclerosis. Other: Head CT reported separately. IMPRESSION: 1. Subtle new T3 superior endplate compression deformity is new since a chest CT last year but age indeterminate. See sagittal image 39. 2. No acute traumatic injury identified in the cervical spine. Advanced cervical spine degeneration appears stable since last year. Report delayed secondary to IT downtime. Electronically Signed   By: Genevie Ann M.D.   On: 04/23/2020 08:15   DG Humerus Right  Result Date: 04/23/2020 CLINICAL DATA:  Post fall now with right arm pain. EXAM: RIGHT HUMERUS - 2+ VIEW COMPARISON:  Right shoulder radiographs-earlier same day FINDINGS: There is a complete fracture involving the anatomic neck of the right humerus with foreshortening and displacement of the humerus anteriorly. Suspected glenohumeral joint effusion. Expected adjacent soft tissue swelling. No radiopaque body. Limited visualization of the  elbow demonstrates apparent deformity involving the neck of the radius. Evaluation for elbow joint effusion is degraded secondary to large field of view. IMPRESSION: 1. Acute complete fracture involving the anatomic neck of the right humerus with foreshortening and displacement of the humerus anteriorly. 2. Apparent deformity involving the neck of the radius, potentially artifactual due to technique and large field of view, though a concomitant radial head fracture could have a similar appearance. Correlation for point tenderness at this location is advised. If patient is able to tolerate positioning, further evaluation with dedicated elbow radiographic series and/or elbow CT could be obtained as indicated. Electronically Signed   By: Sandi Mariscal M.D.   On: 04/23/2020 07:23     EKG: I have personally reviewed.  Sinus rhythm, QTC 419, right bundle blockade, right axis deviation, early R wave progression  Assessment/Plan Principal Problem:   Hyperkalemia Active Problems:   Acquired hypothyroidism   HLD (hyperlipidemia)   Essential hypertension   Iron deficiency anemia   Chronic diastolic CHF (congestive heart failure) (HCC)   CKD (chronic kidney disease) stage 4, GFR 15-29 ml/min (HCC)   Hypothyroidism   CAD (coronary artery disease)   Hypotension   Atrial fibrillation (HCC)   COVID-19 virus infection   Seizure (HCC)   Fall   Closed right arm fracture   T3 vertebral fracture (HCC)   Hyperkalemia: Potassium 6.1 --> 5.9. -Placed on MedSurg bed of observation -Patient was treated with D50, calcium gluconate 1 g, NovoLog 10 units, leukoma 10 g -Follow-up BMP  Acquired hypothyroidism -Synthroid  HLD (hyperlipidemia) -Zocor  Essential hypertension -Hold all blood pressure medications due to episode of hypotension  Iron deficiency anemia: Hemoglobin stable, 9.8 (8.4 on 03/07/2020 -Continue iron supplement  Chronic diastolic CHF (congestive heart failure) ([place]): 2D echo on 02/07/2020  showed EF of 55 to 60% with grade 2 diastolic dysfunction.  Patient has trace leg edema, no respiratory distress.  CHF seem to be compensated. -Hold torsemide due to hypotension -Check  BNP -->370  CKD (chronic kidney disease) stage 4, GFR 15-29 ml/min ([place]): Recent baseline creatinine 1.4-1.7.  Her creatinine is 1.78, BUN 68, close to baseline -f/u with BMP  Hypothyroidism -Synthroid  CAD (coronary artery disease) -Zocor  Hypotension: Resolved with IV fluid resuscitation, likely due to IV pain medication use -Monitor blood pressure closely -Hold blood pressure medications -Discontinue IV fentanyl  Atrial fibrillation ([place]): Heart rate is bradycardia -Cardiac monitor  COVID-19 virus infection: Patient had a positive Covid test on 03/06/2010.  No oxygen desaturation.  Beyond 21 days -No treatment needed.  Seizure -Seizure precaution -When necessary Ativan for seizure -Continue Home medications: Keppra  Fall -Hold Plavix due to scalp hematoma  Closed right arm fracture: right radial neck and right humerus neck fracture.  -Follow-up with Dr. Roland Rack -Pain control: As needed Norco -As needed Robaxin -As needed Tylenol  T3 vertebral fracture [facility]):  CT showed subtle new T3 superior endplate compression. deformity is new since a chest CT last year but age indeterminate.  -Pain control   DVT ppx: SCD Code Status: DNR per her daughter Family Communication:  Yes, patient's daughter by phone Disposition Plan:  Anticipate discharge back to previous environment Consults called:  none Admission status and Level of care: Med-Surg:    obs as inpt   Status is: Observation  The patient remains OBS appropriate and will d/c before 2 midnights.  Dispo: The patient is from: SNF              Anticipated d/c is to: SNF              Patient currently is not medically stable to d/c.   Difficult to place patient No            Date of Service 04/23/2020    [facility]   If 7PM-7AM, please contact night-coverage [email or web address] 04/23/2020, 6:41 PM

## 2020-04-23 NOTE — ED Notes (Signed)
Splint removed by daughter request. Sling still attached.

## 2020-04-23 NOTE — Plan of Care (Signed)

## 2020-04-23 NOTE — ED Notes (Signed)
Patient is resting comfortably. 

## 2020-04-23 NOTE — ED Notes (Signed)
Daughter at bedside. Pt given snacks.

## 2020-04-23 NOTE — ED Notes (Signed)
Pt given water to drink. 

## 2020-04-23 NOTE — Progress Notes (Signed)
Date and time results received: 04/23/20 2045   Test:Potassium Critical Value: 6.9  Name of Provider Notified:B Morrisson, [place]  Orders Received? Or Actions Taken? New stat draw to recheck potassium levels

## 2020-04-23 NOTE — ED Notes (Signed)
Pt resting quietly. Daughter at bedside. Daughter asking if splint can be removed. Pt is also wearing a sling to the right arm. Pt is hypotensive. MD updated. New order received. Daughter updated MD reviewing all results and will be in to speak with her. Lights out for comfort.

## 2020-04-23 NOTE — ED Provider Notes (Signed)
-----------------------------------------   8:19 AM on 04/23/2020 -----------------------------------------  The patient arrived during my shift during the time I was providing critical care for multiple patients in the emergency department.  I received report from the nurse and ordered labs and imaging based on the initial assessment.  I went to evaluate the patient but she was in CT scan and I had a conversation with her daughter to understand what happened and to verify that the orders placed were appropriate.  At the time of shift change I had not personally evaluated the patient so I am deferring the evaluation and treatment to Dr. Cheri Fowler.  The ED course below represents my actions based on the lab results and initial screening even though I did not have the opportunity to evaluate the patient in person.  See Dr. Vashti Hey note for full ED provider note, H&P, etc.  Clinical Course as of 04/23/20 0820  Mon Apr 23, 2020  1219 Comprehensive metabolic panel(!) Patient CMP is notable for acute on chronic kidney disease with a creatinine of 1.78 and an elevated BUN of 69.  However she also shows a potassium of 6.1.  This is somewhat unexpected even with an elevated creatinine.  Her EKG shows a junctional rhythm with right bundle branch block and left posterior fascicular block.  Before I aggressively treat the hyperkalemia I will asked the nurse to send a repeat sample and I will repeat the potassium because I do not want to aggressively treated if it is erroneous. [CF]  [phone] The patient is also hurting and I am ordering fentanyl 50 mcg IV as well as Zofran 4 mg IV. [CF]    Clinical Course User Index [CF] Hinda Kehr, MD      Hinda Kehr, MD 04/23/20 [phone]

## 2020-04-24 DIAGNOSIS — E875 Hyperkalemia: Secondary | ICD-10-CM

## 2020-04-24 DIAGNOSIS — S0003XA Contusion of scalp, initial encounter: Secondary | ICD-10-CM | POA: Diagnosis not present

## 2020-04-24 LAB — CBC
HCT: 29.1 % — ABNORMAL LOW (ref 36.0–46.0)
Hemoglobin: 9.3 g/dL — ABNORMAL LOW (ref 12.0–15.0)
MCH: 31.2 pg (ref 26.0–34.0)
MCHC: 32 g/dL (ref 30.0–36.0)
MCV: 97.7 fL (ref 80.0–100.0)
Platelets: 147 10*3/uL — ABNORMAL LOW (ref 150–400)
RBC: 2.98 MIL/uL — ABNORMAL LOW (ref 3.87–5.11)
RDW: 15.6 % — ABNORMAL HIGH (ref 11.5–15.5)
WBC: 5.2 10*3/uL (ref 4.0–10.5)
nRBC: 0 % (ref 0.0–0.2)

## 2020-04-24 LAB — BASIC METABOLIC PANEL
Anion gap: 10 (ref 5–15)
BUN: 65 mg/dL — ABNORMAL HIGH (ref 8–23)
CO2: 21 mmol/L — ABNORMAL LOW (ref 22–32)
Calcium: 8.6 mg/dL — ABNORMAL LOW (ref 8.9–10.3)
Chloride: 108 mmol/L (ref 98–111)
Creatinine, Ser: 1.76 mg/dL — ABNORMAL HIGH (ref 0.44–1.00)
GFR, Estimated: 27 mL/min — ABNORMAL LOW (ref >60)
Glucose, Bld: 86 mg/dL (ref 70–99)
Potassium: 5.5 mmol/L — ABNORMAL HIGH (ref 3.5–5.1)
Sodium: 139 mmol/L (ref 135–145)

## 2020-04-24 LAB — GLUCOSE, CAPILLARY
Glucose-Capillary: 78 mg/dL (ref 70–99)
Glucose-Capillary: 122 mg/dL — ABNORMAL HIGH (ref 70–99)

## 2020-04-24 MED ORDER — SODIUM ZIRCONIUM CYCLOSILICATE 10 G PO PACK: 10.0000 g | PACK | ORAL | 0 refills | Status: DC

## 2020-04-24 MED ORDER — HYDRALAZINE HCL 20 MG/ML IJ SOLN
10.0000 mg | Freq: Once | INTRAMUSCULAR | Status: AC
  Administered 2020-04-24: 10 mg via INTRAVENOUS

## 2020-04-24 NOTE — Plan of Care (Signed)
  Problem: Education: Goal: Knowledge of General Education information will improve Description: Including pain rating scale, medication(s)/side effects and non-pharmacologic comfort measures 04/24/2020 1828 by Bernarda Caffey, RN Outcome: Adequate for Discharge 04/24/2020 1828 by Bernarda Caffey, RN Outcome: Adequate for Discharge   Problem: Health Behavior/Discharge Planning: Goal: Ability to manage health-related needs will improve 04/24/2020 1828 by Bernarda Caffey, RN Outcome: Adequate for Discharge 04/24/2020 1828 by Bernarda Caffey, RN Outcome: Adequate for Discharge   Problem: Clinical Measurements: Goal: Ability to maintain clinical measurements within normal limits will improve 04/24/2020 1828 by Bernarda Caffey, RN Outcome: Adequate for Discharge 04/24/2020 1828 by Bernarda Caffey, RN Outcome: Adequate for Discharge Goal: Will remain free from infection 04/24/2020 1828 by Bernarda Caffey, RN Outcome: Adequate for Discharge 04/24/2020 1828 by Bernarda Caffey, RN Outcome: Adequate for Discharge Goal: Diagnostic test results will improve 04/24/2020 1828 by Bernarda Caffey, RN Outcome: Adequate for Discharge 04/24/2020 1828 by Bernarda Caffey, RN Outcome: Adequate for Discharge Goal: Respiratory complications will improve 04/24/2020 1828 by Bernarda Caffey, RN Outcome: Adequate for Discharge 04/24/2020 1828 by Bernarda Caffey, RN Outcome: Adequate for Discharge Goal: Cardiovascular complication will be avoided 04/24/2020 1828 by Bernarda Caffey, RN Outcome: Adequate for Discharge 04/24/2020 1828 by Bernarda Caffey, RN Outcome: Adequate for Discharge   Problem: Activity: Goal: Risk for activity intolerance will decrease 04/24/2020 1828 by Bernarda Caffey, RN Outcome: Adequate for Discharge 04/24/2020 1828 by Bernarda Caffey, RN Outcome: Adequate for Discharge   Problem: Nutrition: Goal: Adequate nutrition will be maintained 04/24/2020 1828 by  Bernarda Caffey, RN Outcome: Adequate for Discharge 04/24/2020 1828 by Bernarda Caffey, RN Outcome: Adequate for Discharge   Problem: Coping: Goal: Level of anxiety will decrease 04/24/2020 1828 by Bernarda Caffey, RN Outcome: Adequate for Discharge 04/24/2020 1828 by Bernarda Caffey, RN Outcome: Adequate for Discharge   Problem: Elimination: Goal: Will not experience complications related to bowel motility 04/24/2020 1828 by Bernarda Caffey, RN Outcome: Adequate for Discharge 04/24/2020 1828 by Bernarda Caffey, RN Outcome: Adequate for Discharge Goal: Will not experience complications related to urinary retention 04/24/2020 1828 by Bernarda Caffey, RN Outcome: Adequate for Discharge 04/24/2020 1828 by Bernarda Caffey, RN Outcome: Adequate for Discharge   Problem: Pain Managment: Goal: General experience of comfort will improve 04/24/2020 1828 by Bernarda Caffey, RN Outcome: Adequate for Discharge 04/24/2020 1828 by Bernarda Caffey, RN Outcome: Adequate for Discharge   Problem: Safety: Goal: Ability to remain free from injury will improve 04/24/2020 1828 by Bernarda Caffey, RN Outcome: Adequate for Discharge 04/24/2020 1828 by Bernarda Caffey, RN Outcome: Adequate for Discharge   Problem: Skin Integrity: Goal: Risk for impaired skin integrity will decrease 04/24/2020 1828 by Bernarda Caffey, RN Outcome: Adequate for Discharge 04/24/2020 1828 by Bernarda Caffey, RN Outcome: Adequate for Discharge

## 2020-04-24 NOTE — Progress Notes (Signed)
[facility]) Hospital Liaison RN note:  This patient is currently followed by out patient based palliative care with [facility]. Misty Green, TOC made aware. [facility] Liaison will follow for disposition.  Thank you.  Zandra Abts, RN [facility] Liaison [phone]

## 2020-04-24 NOTE — Discharge Instructions (Signed)
After review of your home medications the elevated blood potassium level may be due to progression of her underlying chronic kidney disease.  Per my review you are not on any specific medications that should raise serum potassium.  Given this I recommend starting medication called Lokelma which can lower serum potassium.  The dose I recommended at time of discharge is 10g every other day.  He will need repeat laboratory work-up including kidney function and serum potassium level within 5 days of discharge.  Hyperkalemia Hyperkalemia occurs when the level of potassium in your blood is too high. Potassium is an important nutrient that helps the muscles and nerves function normally. It affects how the heart works, and it helps keep fluids and minerals balanced in the body. If there is too much potassium in your blood, it can affect your heart's ability to function normally. Potassium is normally removed (excreted) from the body by the kidneys. Hyperkalemia can result from various conditions. It can range from mild to severe. What are the causes? This condition may be caused by:  Taking in too much potassium. You can do this by: ? Using salt substitutes. They contain large amounts of potassium. ? Taking potassium supplements. ? Eating foods that are high in potassium.  Excreting too little potassium. This can happen if: ? Your kidneys are not working properly. Kidney (renal) disease, including short-term or long-term renal failure, is a common cause of hyperkalemia. ? You are taking medicines that lower your excretion of potassium. ? You have Addison's disease. ? You have a urinary tract blockage, such as kidney stones. ? You are on treatment to mechanically clean your blood (dialysis) and you skip a treatment.  Releasing a high amount of potassium from your cells into your blood. This can happen with: ? Injury to muscles (rhabdomyolysis) or other tissues. Most potassium is stored in your  muscles. ? Severe burns or infections. ? Acidic blood plasma (acidosis). Acidosis can result from many diseases, such as uncontrolled diabetes. What increases the risk? The following factors may make you more likely to develop this condition:  Kidney disease. This puts you at the highest risk.  Addison's disease. This is a condition where the adrenal glands do not produce enough hormones.  Alcoholism or heavy drug use.  Using certain blood pressure medicines, such as ACE inhibitors, angiotensin II receptor blockers (ARBs), or potassium-sparing diuretics such as spironolactone.  Severe injury or burn. What are the signs or symptoms? In many cases, there are no symptoms. However, when your potassium level becomes high enough, you may have symptoms such as:  An irregular or very slow heartbeat.  Nausea.  Tiredness (fatigue).  Confusion.  Tingling of your skin or numbness of your hands or feet.  Muscle cramps.  Muscle weakness.  Not being able to move (paralysis). How is this diagnosed? This condition may be diagnosed based on:  Your symptoms and medical history. Your health care provider will ask about your use of prescription and non-prescription drugs.  A physical exam.  Blood tests.  An electrocardiogram (ECG). How is this treated? Treatment depends on the cause and severity of your condition. Treatment may need to be done in the hospital setting. Treatment may include:  IV glucose (sugar) along with insulin to shift potassium out of your blood and into your cells.  A medicine called albuterol to shift potassium out of your blood and into your cells.  Medicines to remove the potassium from your body.  Dialysis to remove the potassium  from your body.  Calcium to protect your heart from the effects of high potassium, such as irregular rhythms (arrhythmias). Follow these instructions at home:  Take over-the-counter and prescription medicines only as told by your  health care provider.  Do not take any supplements, natural products, herbs, or vitamins without reviewing them with your health care provider. Certain supplements and natural food products contain high amounts of potassium.  Limit your alcohol intake as told by your health care provider.  Do not use drugs. If you need help quitting, ask your health care provider.  If you have kidney disease, you may need to follow a low-potassium diet. A dietitian can help you learn which foods have high or low amounts of potassium.  Keep all follow-up visits as told by your health care provider. This is important.   Contact a health care provider if you:  Have an irregular or very slow heartbeat.  Feel light-headed.  Feel weak.  Are nauseous.  Have tingling or numbness in your hands or feet. Get help right away if you:  Have shortness of breath.  Have chest pain or discomfort.  Pass out.  Have muscle paralysis. Summary  Hyperkalemia occurs when the level of potassium in your blood is too high.  This condition may be caused by taking in too much potassium, excreting too little potassium, or releasing a high amount of potassium from your cells into your blood.  Hyperkalemia can result from many underlying conditions, especially chronic kidney disease, or from taking certain medicines.  Treatment of hyperkalemia may include medicine to shift potassium out of your blood and into your cells or to remove the potassium from your body.  If you have kidney disease, you may need to follow a low-potassium diet. A dietitian can help you learn which foods have high or low amounts of potassium. This information is not intended to replace advice given to you by your health care provider. Make sure you discuss any questions you have with your health care provider. Document Revised: 07/14/2019 Document Reviewed: 07/14/2019 Elsevier Patient Education  [employer].

## 2020-04-24 NOTE — TOC Progression Note (Signed)
Transition of Care [facility]) - Progression Note    Patient Details  Name: Amy [name] MRN: [id number] Date of Birth: 05-29-1927  [facility] CM/SW Contact  Shelbie Ammons, RN Phone Number: 04/24/2020, 2:54 PM  Clinical Narrative:   RNCM met with patient and son in room. Patient is from [place] [employer] and they wish for her to return there. She is followed by [facility]. RNCM reached out to [place] with [facility] and patient can return. RNCM completed EMS paperwork and scheduled transport.          Expected Discharge Plan and Services           Expected Discharge Date: 04/24/20                                     Social Determinants of Health (SDOH) Interventions    Readmission Risk Interventions Readmission Risk Prevention Plan 08/23/2019 08/15/2019  Transportation Screening Complete Complete  Medication Review [employer]) Complete Complete  PCP or Specialist appointment within 3-5 days of discharge Complete Complete  HRI or Home Care Consult Complete Complete  SW Recovery Care/Counseling Consult Complete Complete  Palliative Care Screening Not Applicable Not [place] Complete Not Applicable  Some recent data might be hidden

## 2020-04-24 NOTE — Discharge Summary (Signed)
Physician Discharge Summary  Amy [name] BTD:[id number] DOB: 1927/05/15 DOA: 04/23/2020  PCP: [name], MD  Admit date: 04/23/2020 Discharge date: 04/24/2020  Admitted From: SNF Disposition:  SNF  Recommendations for Outpatient Follow-up:  1. Follow up with PCP in 1-2 weeks 2. Follow-up with orthopedics Dr. Roland Rack as directed  Home Health: No Equipment/Devices: None Discharge Condition: Stable CODE STATUS: DNR Diet recommendation: Regular Brief/Interim Summary: 87 y.o. female with medical history significant of dCHF, seizure, dementia, CAD with DES, HTN ,HLD, DM, COPD, GERD, hypothyroidism, depression, iron deficiency anemia, GI bleeding, CKD-4, a fib not on [facility], covid19 infecion 03/06/20, recent completed right arm fracture with conservative treatment (right radial neck and right humerus neck fracture), presents with fall.   Per report, pt had unwitnessed fall in SNF. She developed hematoma in the back of her head. She has hx of complicated right arm fracture with conservative treatment currently. Pt is following up with Dr. Roland Rack of ortho. She complains of right arm pain.  Per her daughter, patient has mild cough, but no chest pain, shortness of breath.  Patient had diarrhea in facility which is likely due to laxative use.  Patient is taking MiraLAX and Colace in facility.  On day of discharge patient is stable. She does endorse pain in the right arm however states she is pain is chronic and always present. Had a long discussion with the patient's son at bedside with his sister on the phone. I explained that the reason for admission was due to hyperkalemia which was corrected after medical treatment with insulin, dextrose, calcium gluconate, Lokelma.  After review of medical record patient is not on any medications that could specifically worsen serum potassium levels. As such we will recommend patient take Lokelma 10 g q. OD at time of discharge. She should get repeat labs  including a kidney function and a serum potassium level within 5 to 7 days of discharge. Patient will return to skilled nursing facility at this time.  Discharge Diagnoses:  Principal Problem:   Hyperkalemia Active Problems:   Acquired hypothyroidism   HLD (hyperlipidemia)   Essential hypertension   Iron deficiency anemia   Chronic diastolic CHF (congestive heart failure) (HCC)   CKD (chronic kidney disease) stage 4, GFR 15-29 ml/min (HCC)   Hypothyroidism   CAD (coronary artery disease)   Hypotension   Atrial fibrillation (HCC)   COVID-19 virus infection   Seizure ([place])   Fall   Closed right arm fracture   T3 vertebral fracture (HCC)  Hyperkalemia Potassium elevated to 6.1 on admission Treated with insulin, D50, calcium gluconate, Lokelma Potassium at time of discharge 5.5 No EKG changes Okay for discharge at this time Recommend Lokelma 10 g every other day at time of discharge Recommend repeat lab work within 5 to 7 days of discharge Communicated discharge recommendations with patient's daughter via phone  Acquired hypothyroidism -Synthroid  HLD (hyperlipidemia) -Zocor  Essential hypertension -Patient had hypotension owing to fentanyl administration in ED. Blood pressure recovered and is at baseline at time of discharge. Can resume home blood pressure medication  Iron deficiency anemia: Hemoglobin stable, 9.8 (8.4 on 03/07/2020 -Continue iron supplement  Chronic diastolic CHF (congestive heart failure) ([place]): 2D echo on 02/07/2020 showed EF of 55 to 60% with grade 2 diastolic dysfunction.  Patient has trace leg edema, no respiratory distress.  CHF seem to be compensated. -Resume home torsemide at time of discharge  CKD (chronic kidney disease) stage 4, GFR 15-29 ml/min ([place]): Recent baseline creatinine 1.4-1.7.  Her creatinine is 1.78, BUN 68, close to baseline -Suspect hyperkalemia may be related to progression of underlying chronic kidney disease -Outpatient  PCP/nephrology follow-up  Hypothyroidism -Synthroid  CAD (coronary artery disease) -Zocor  Hypotension  Resolved with IV fluid resuscitation, likely due to IV pain medication use  Atrial fibrillation ([place]): Rate controlled. Patient not anticoagulated presumably due to fall risk. -Cardiac monitor  COVID-19 virus infection: Patient had a positive Covid test on 03/06/2010.  No oxygen desaturation.  Beyond 21 days -No treatment needed.  Seizure -Continue Home medications: Keppra  Fall -Continue Plavix at time of discharge  Closed right arm fracture  right radial neck and right humerus neck fracture.  -Follow-up with Dr. Roland Rack Per patient's family patient is not a surgical candidate will follow up with Dr. Roland Rack as outpatient  T3 vertebral fracture [facility]):  CT showed subtle new T3 superior endplate compression. deformity is new since a chest CT last year but age indeterminate.  -Pain control  Discharge Instructions  Discharge Instructions    Diet - low sodium heart healthy   Complete by: As directed    Increase activity slowly   Complete by: As directed      Allergies as of 04/24/2020      Reactions   Atenolol Other (See Comments)   Other reaction(s): Other (See Comments) Decreased heart rate Decreased heart rate   Codeine Shortness Of Breath   Rash, difficulty breathing, nausea.   Losartan Other (See Comments)   Hyperkalemia   Morphine And Related Shortness Of Breath   Rash, difficulty breathing, nausea.   Nsaids Other (See Comments)   Other reaction(s): Unknown   Rofecoxib    Other reaction(s): Unknown   Sucralfate Other (See Comments)   Throat tightness   Sulfa Antibiotics    Other reaction(s): Unknown      Medication List    TAKE these medications   acetaminophen 500 MG tablet Commonly known as: TYLENOL Take 1,000 mg by mouth 3 (three) times daily as needed for mild pain or moderate pain.   albuterol 108 (90 Base) MCG/ACT inhaler Commonly  known as: VENTOLIN HFA Inhale into the lungs every 6 (six) hours as needed for wheezing or shortness of breath.   amitriptyline 10 MG tablet Commonly known as: ELAVIL Take 10 mg by mouth at bedtime.   cloNIDine 0.1 MG tablet Commonly known as: CATAPRES Take 1 tablet (0.1 mg total) by mouth 2 (two) times daily.   clopidogrel 75 MG tablet Commonly known as: PLAVIX Take 1 tablet (75 mg total) by mouth daily.   docusate sodium 100 MG capsule Commonly known as: COLACE Take 1 capsule (100 mg total) by mouth 2 (two) times daily.   doxazosin 4 MG tablet Commonly known as: CARDURA Take 4 mg by mouth daily.   doxazosin 2 MG tablet Commonly known as: CARDURA Take by mouth 2 (two) times daily.   iron polysaccharides 150 MG capsule Commonly known as: NIFEREX Take 1 capsule (150 mg total) by mouth daily.   levETIRAcetam 500 MG tablet Commonly known as: KEPPRA Take 1 tablet (500 mg total) by mouth 2 (two) times daily.   levothyroxine 88 MCG tablet Commonly known as: SYNTHROID 88 mcg daily before breakfast.   lisinopril 2.5 MG tablet Commonly known as: ZESTRIL Take 2.5 mg by mouth daily.   melatonin 5 MG Tabs Take 1 tablet (5 mg total) by mouth at bedtime as needed (sleep).   montelukast 10 MG tablet Commonly known as: SINGULAIR Take 10 mg by mouth  at bedtime.   nitroGLYCERIN 0.4 MG SL tablet Commonly known as: NITROSTAT Place 1 tablet (0.4 mg total) under the tongue every 5 (five) minutes x 3 doses as needed for chest pain.   pantoprazole 20 MG tablet Commonly known as: PROTONIX Take 20 mg by mouth daily.   polyethylene glycol 17 g packet Commonly known as: MIRALAX / GLYCOLAX Take 17 g by mouth daily.   pregabalin 50 MG capsule Commonly known as: LYRICA Take 50-100 mg by mouth See admin instructions. Take 1 capsule (50mg ) by mouth every morning and take 2 capsules (100mg ) by mouth every night at bedtime   simvastatin 40 MG tablet Commonly known as: ZOCOR Take 1  tablet (40 mg total) by mouth at bedtime.   sodium zirconium cyclosilicate 10 g Pack packet Commonly known as: LOKELMA Take 10 g by mouth every other day.   Symbicort 160-4.5 MCG/ACT inhaler Generic drug: budesonide-formoterol Inhale 2 puffs into the lungs 2 (two) times daily.   torsemide 20 MG tablet Commonly known as: DEMADEX Take 20 mg by mouth daily.   traMADol 50 MG tablet Commonly known as: ULTRAM Take 50 mg by mouth every 6 (six) hours as needed.       Follow-up Information    [name], MD. Schedule an appointment as soon as possible for a visit in 1 week.   Specialty: Internal Medicine Why: Office will call Patient w/ Appt Contact information: [place] [address] [phone]        Minna Merritts, MD. Go on 05/02/2020.   Specialty: Cardiology Why: @ 9:30 am Contact information: [address] [place]  [address] [phone]              Allergies  Allergen Reactions  . Atenolol Other (See Comments)    Other reaction(s): Other (See Comments) Decreased heart rate Decreased heart rate  . Codeine Shortness Of Breath    Rash, difficulty breathing, nausea.  . Losartan Other (See Comments)    Hyperkalemia  . Morphine And Related Shortness Of Breath    Rash, difficulty breathing, nausea.   . Nsaids Other (See Comments)    Other reaction(s): Unknown  . Rofecoxib     Other reaction(s): Unknown  . Sucralfate Other (See Comments)    Throat tightness  . Sulfa Antibiotics     Other reaction(s): Unknown    Consultations:  None   Procedures/Studies: DG Shoulder Right  Result Date: 04/23/2020 CLINICAL DATA:  Post fall, now with right pain. EXAM: RIGHT SHOULDER - 2+ VIEW COMPARISON:  Right humerus radiographs-earlier same day FINDINGS: There is a complete fracture involving the anatomic neck of the right humerus with foreshortening and displacement of the humerus anteriorly. Additionally, there is a  linear approximately 1.9 x 0.3 cm calcification about the superior aspect of the glenohumeral joint which may represent additional avulsed fracture. [place] about the anterior inferior aspect the glenohumeral joint may represent age-indeterminate loose bodies. Suspected glenohumeral joint effusion. Normal appearance of the adjacent AC joint. Limited visualization of the adjacent thorax is normal. Expected adjacent soft tissue swelling. No radiopaque body. IMPRESSION: 1. Complete fracture involving the anatomic neck of the right humerus with foreshortening and displacement of the humerus anteriorly. 2. Linear approximately 1.9 x 0.3 cm calcification about the superior aspect of the glenohumeral joint may represent additional avulsed fracture fragment. Electronically Signed   By: Sandi Mariscal M.D.   On: 04/23/2020 07:20   CT Head Wo Contrast  Result Date:  04/23/2020 CLINICAL DATA:  87 year old female status post fall, unwitnessed. Posterior head hematoma. Pain. COMPARISON:  Brain MRI 03/07/2020.  Head CT 03/06/2020. FINDINGS: Sinuses: Paranasal sinuses, tympanic cavities and mastoids are stable and well aerated. Scalp, orbits: Broad-based right posterior convexity scalp hematoma (series 3, image 57). No scalp soft tissue gas. Underlying calvarium appears intact. Elsewhere stable orbit and scalp soft tissues. Skull: Stable contact. Vascular: Calcified atherosclerosis at the skull base. No suspicious vascular hyperdensity. Brain: Stable cerebral volume. No intracranial mass effect, midline shift or ventriculomegaly. No acute intracranial hemorrhage or cortically based infarct identified. Stable gray-white matter differentiation, patchy bilateral white matter hypodensity. Evidence of a small chronic lacunar infarct in the left cerebellum. IMPRESSION: 1. Right posterior scalp hematoma without underlying skull fracture. 2.  Stable noncontrast CT appearance of the brain. Electronically Signed   By: Genevie Ann M.D.   On:  04/23/2020 06:48   CT Cervical Spine Wo Contrast  Result Date: 04/23/2020 CLINICAL DATA:  87 year old female status post fall, unwitnessed. Posterior head hematoma. Pain. Report accidentally delayed due to IT downtime. EXAM: CT CERVICAL SPINE WITHOUT CONTRAST TECHNIQUE: Multidetector CT imaging of the cervical spine was performed without intravenous contrast. Multiplanar CT image reconstructions were also generated. COMPARISON:  Head CT today reported separately. Cervical spine CT 08/18/2019. Chest CT 08/06/2019. FINDINGS: Alignment: Stable straightening of cervical lordosis. Stable cervicothoracic junction alignment. Mild anterolisthesis. Bilateral posterior element alignment is within normal limits. Skull base and vertebrae: Visualized skull base is intact. No atlanto-occipital dissociation. No acute osseous abnormality identified. Soft tissues and spinal canal: No prevertebral fluid or swelling. No visible canal hematoma. Calcified carotid atherosclerosis in the neck. Otherwise negative visible noncontrast neck soft tissues. Disc levels: Advanced cervical spine degeneration at C5-C6 and C6-C7. Severe hypertrophy but also chronic ankylosis of the left C3-C4 facets. Degeneration appears stable since last year. Upper chest: There is a subtle new T3 superior endplate compression deformity when compared to the chest CT last year. See sagittal image 39. Otherwise visible upper thoracic levels appear stable. Thoracic osteopenia. Mild apical lung scarring.  Calcified great vessel atherosclerosis. Other: Head CT reported separately. IMPRESSION: 1. Subtle new T3 superior endplate compression deformity is new since a chest CT last year but age indeterminate. See sagittal image 39. 2. No acute traumatic injury identified in the cervical spine. Advanced cervical spine degeneration appears stable since last year. Report delayed secondary to IT downtime. Electronically Signed   By: Genevie Ann M.D.   On: 04/23/2020 08:15   DG  Humerus Right  Result Date: 04/23/2020 CLINICAL DATA:  Post fall now with right arm pain. EXAM: RIGHT HUMERUS - 2+ VIEW COMPARISON:  Right shoulder radiographs-earlier same day FINDINGS: There is a complete fracture involving the anatomic neck of the right humerus with foreshortening and displacement of the humerus anteriorly. Suspected glenohumeral joint effusion. Expected adjacent soft tissue swelling. No radiopaque body. Limited visualization of the elbow demonstrates apparent deformity involving the neck of the radius. Evaluation for elbow joint effusion is degraded secondary to large field of view. IMPRESSION: 1. Acute complete fracture involving the anatomic neck of the right humerus with foreshortening and displacement of the humerus anteriorly. 2. Apparent deformity involving the neck of the radius, potentially artifactual due to technique and large field of view, though a concomitant radial head fracture could have a similar appearance. Correlation for point tenderness at this location is advised. If patient is able to tolerate positioning, further evaluation with dedicated elbow radiographic series and/or elbow CT could be obtained  as indicated. Electronically Signed   By: Sandi Mariscal M.D.   On: 04/23/2020 07:23    (Echo, Carotid, EGD, Colonoscopy, ERCP)    Subjective: Patient seen and examined on day of discharge. Potassium improved. Patient endorses chronic pain, at baseline. Stable for discharge to skilled nursing facility.  Discharge Exam: Vitals:   04/24/20 0742 04/24/20 1145  BP: (!) 149/29 (!) 156/96  Pulse: (!) 55 (!) 52  Resp: 19 17  Temp: 98.8 F (37.1 C) 98.5 F (36.9 C)  SpO2: 95% 93%   Vitals:   04/23/20 2320 04/24/20 0508 04/24/20 0742 04/24/20 1145  BP: (!) 154/30 (!) 154/64 (!) 149/29 (!) 156/96  Pulse: (!) 54 (!) 56 (!) 55 (!) 52  Resp: 18 18 19 17   Temp: (!) 97.4 F (36.3 C) (!) 97.5 F (36.4 C) 98.8 F (37.1 C) 98.5 F (36.9 C)  TempSrc:    Oral  SpO2:  97% 100% 95% 93%  Weight:        General: Pt is alert, awake, not in acute distress Cardiovascular: RRR, S1/S2 +, no rubs, no gallops Respiratory: CTA bilaterally, no wheezing, no rhonchi Abdominal: Soft, NT, ND, bowel sounds + Extremities: no edema, no cyanosis    The results of significant diagnostics from this hospitalization (including imaging, microbiology, ancillary and laboratory) are listed below for reference.     Microbiology: Recent Results (from the past 240 hour(s))  Resp Panel by RT-PCR (Flu A&B, Covid) Nasopharyngeal Swab     Status: Abnormal   Collection Time: 04/23/20  9:49 AM   Specimen: Nasopharyngeal Swab; Nasopharyngeal(NP) swabs in vial transport medium  Result Value Ref Range Status   SARS Coronavirus 2 by RT PCR POSITIVE (A) NEGATIVE Final    Comment: RESULT CALLED TO, READ BACK BY AND VERIFIED WITH: S.ALLISON,RN 1113 04/23/20 GM (NOTE) SARS-CoV-2 target nucleic acids are DETECTED.  The SARS-CoV-2 RNA is generally detectable in upper respiratory specimens during the acute phase of infection. Positive results are indicative of the presence of the identified virus, but do not rule out bacterial infection or co-infection with other pathogens not detected by the test. Clinical correlation with patient history and other diagnostic information is necessary to determine patient infection status. The expected result is Negative.  Fact Sheet for Patients: EntrepreneurPulse.com.au  Fact Sheet for Healthcare Providers: IncredibleEmployment.be  This test is not yet approved or cleared by the [place] FDA and  has been authorized for detection and/or diagnosis of SARS-CoV-2 by FDA under an Emergency Use Authorization (EUA).  This EUA will remain in effect (meaning this test can be used ) for the duration of  the COVID-19 declaration under Section 564(b)(1) of the Act, 21 U.S.C. section 360bbb-3(b)(1), unless the  authorization is terminated or revoked sooner.     Influenza A by PCR NEGATIVE NEGATIVE Final   Influenza B by PCR NEGATIVE NEGATIVE Final    Comment: (NOTE) The Xpert Xpress SARS-CoV-2/FLU/RSV plus assay is intended as an aid in the diagnosis of influenza from Nasopharyngeal swab specimens and should not be used as a sole basis for treatment. Nasal washings and aspirates are unacceptable for Xpert Xpress SARS-CoV-2/FLU/RSV testing.  Fact Sheet for Patients: EntrepreneurPulse.com.au  Fact Sheet for Healthcare Providers: IncredibleEmployment.be  This test is not yet approved or cleared by the [place] FDA and has been authorized for detection and/or diagnosis of SARS-CoV-2 by FDA under an Emergency Use Authorization (EUA). This EUA will remain in effect (meaning this test can be used) for the duration of  the COVID-19 declaration under Section 564(b)(1) of the Act, 21 U.S.C. section 360bbb-3(b)(1), unless the authorization is terminated or revoked.  Performed at [facility], [address] [place], [place] [address]      Labs: BNP (last 3 results) Recent Labs    08/12/19 1300 02/02/20 1221 04/23/20 0530  BNP 58.8 617.7* 628.3*   Basic Metabolic Panel: Recent Labs  Lab 04/23/20 0530 04/23/20 0715 04/23/20 1940 04/23/20 2112 04/24/20 0856  NA 138  --   --   --  139  K 6.1* 5.9* 6.9* 5.7* 5.5*  CL 108  --   --   --  108  CO2 22  --   --   --  21*  GLUCOSE 89  --   --   --  86  BUN 69*  --   --   --  65*  CREATININE 1.78*  --   --   --  1.76*  CALCIUM 8.7*  --   --   --  8.6*   Liver Function Tests: Recent Labs  Lab 04/23/20 0530  AST 17  ALT 11  ALKPHOS 74  BILITOT 0.7  PROT 5.8*  ALBUMIN 3.1*   No results for input(s): LIPASE, AMYLASE in the last 168 hours. No results for input(s): AMMONIA in the last 168 hours. CBC: Recent Labs  Lab 04/23/20 0530 04/24/20 0856  WBC 8.3 5.2  NEUTROABS 5.4  --    HGB 9.8* 9.3*  HCT 29.6* 29.1*  MCV 95.8 97.7  PLT 174 147*   Cardiac Enzymes: No results for input(s): CKTOTAL, CKMB, CKMBINDEX, TROPONINI in the last 168 hours. BNP: Invalid input(s): POCBNP CBG: Recent Labs  Lab 04/24/20 0742  GLUCAP 78   D-Dimer No results for input(s): DDIMER in the last 72 hours. Hgb A1c No results for input(s): HGBA1C in the last 72 hours. Lipid Profile No results for input(s): CHOL, HDL, LDLCALC, TRIG, CHOLHDL, LDLDIRECT in the last 72 hours. Thyroid function studies No results for input(s): TSH, T4TOTAL, T3FREE, THYROIDAB in the last 72 hours.  Invalid input(s): FREET3 Anemia work up No results for input(s): VITAMINB12, FOLATE, FERRITIN, TIBC, IRON, RETICCTPCT in the last 72 hours. Urinalysis    Component Value Date/Time   COLORURINE YELLOW (A) 08/18/2019 0745   APPEARANCEUR CLEAR (A) 08/18/2019 0745   APPEARANCEUR Cloudy (A) 03/15/2019 0909   LABSPEC 1.014 08/18/2019 0745   LABSPEC 1.025 10/11/2013 1637   PHURINE 5.0 08/18/2019 0745   GLUCOSEU NEGATIVE 08/18/2019 0745   GLUCOSEU Negative 10/11/2013 1637   HGBUR NEGATIVE 08/18/2019 0745   BILIRUBINUR NEGATIVE 08/18/2019 0745   BILIRUBINUR Negative 03/15/2019 0909   BILIRUBINUR Negative 10/11/2013 1637   KETONESUR NEGATIVE 08/18/2019 0745   PROTEINUR NEGATIVE 08/18/2019 0745   NITRITE NEGATIVE 08/18/2019 0745   LEUKOCYTESUR NEGATIVE 08/18/2019 0745   LEUKOCYTESUR Negative 10/11/2013 1637   Sepsis Labs Invalid input(s): PROCALCITONIN,  WBC,  LACTICIDVEN Microbiology Recent Results (from the past 240 hour(s))  Resp Panel by RT-PCR (Flu A&B, Covid) Nasopharyngeal Swab     Status: Abnormal   Collection Time: 04/23/20  9:49 AM   Specimen: Nasopharyngeal Swab; Nasopharyngeal(NP) swabs in vial transport medium  Result Value Ref Range Status   SARS Coronavirus 2 by RT PCR POSITIVE (A) NEGATIVE Final    Comment: RESULT CALLED TO, READ BACK BY AND VERIFIED WITH: S.ALLISON,RN 1113 04/23/20  GM (NOTE) SARS-CoV-2 target nucleic acids are DETECTED.  The SARS-CoV-2 RNA is generally detectable in upper respiratory specimens during the acute phase of  infection. Positive results are indicative of the presence of the identified virus, but do not rule out bacterial infection or co-infection with other pathogens not detected by the test. Clinical correlation with patient history and other diagnostic information is necessary to determine patient infection status. The expected result is Negative.  Fact Sheet for Patients: EntrepreneurPulse.com.au  Fact Sheet for Healthcare Providers: IncredibleEmployment.be  This test is not yet approved or cleared by the [place] FDA and  has been authorized for detection and/or diagnosis of SARS-CoV-2 by FDA under an Emergency Use Authorization (EUA).  This EUA will remain in effect (meaning this test can be used ) for the duration of  the COVID-19 declaration under Section 564(b)(1) of the Act, 21 U.S.C. section 360bbb-3(b)(1), unless the authorization is terminated or revoked sooner.     Influenza A by PCR NEGATIVE NEGATIVE Final   Influenza B by PCR NEGATIVE NEGATIVE Final    Comment: (NOTE) The Xpert Xpress SARS-CoV-2/FLU/RSV plus assay is intended as an aid in the diagnosis of influenza from Nasopharyngeal swab specimens and should not be used as a sole basis for treatment. Nasal washings and aspirates are unacceptable for Xpert Xpress SARS-CoV-2/FLU/RSV testing.  Fact Sheet for Patients: EntrepreneurPulse.com.au  Fact Sheet for Healthcare Providers: IncredibleEmployment.be  This test is not yet approved or cleared by the [place] FDA and has been authorized for detection and/or diagnosis of SARS-CoV-2 by FDA under an Emergency Use Authorization (EUA). This EUA will remain in effect (meaning this test can be used) for the duration of the COVID-19  declaration under Section 564(b)(1) of the Act, 21 U.S.C. section 360bbb-3(b)(1), unless the authorization is terminated or revoked.  Performed at [facility], [address] [place], [place] [address]      Time coordinating discharge: Over 30 minutes  SIGNED:   Sidney Ace, MD  [facility] 04/24/2020, 1:04 PM Pager   If 7PM-7AM, please contact night-coverage

## 2020-04-26 ENCOUNTER — Other Ambulatory Visit: Payer: Self-pay | Admitting: Nephrology

## 2020-04-30 ENCOUNTER — Telehealth: Payer: Self-pay | Admitting: Family

## 2020-04-30 NOTE — Telephone Encounter (Signed)
Verbal consent sent through [employer]

## 2020-05-01 ENCOUNTER — Encounter: Payer: Self-pay | Admitting: Family

## 2020-05-01 ENCOUNTER — Telehealth (INDEPENDENT_AMBULATORY_CARE_PROVIDER_SITE_OTHER): Payer: Medicare Other | Admitting: Family

## 2020-05-01 ENCOUNTER — Other Ambulatory Visit: Payer: Self-pay

## 2020-05-01 VITALS — Ht 62.0 in | Wt 175.0 lb

## 2020-05-01 DIAGNOSIS — N189 Chronic kidney disease, unspecified: Secondary | ICD-10-CM | POA: Diagnosis not present

## 2020-05-01 DIAGNOSIS — I2510 Atherosclerotic heart disease of native coronary artery without angina pectoris: Secondary | ICD-10-CM

## 2020-05-01 DIAGNOSIS — I480 Paroxysmal atrial fibrillation: Secondary | ICD-10-CM

## 2020-05-01 DIAGNOSIS — I5032 Chronic diastolic (congestive) heart failure: Secondary | ICD-10-CM

## 2020-05-01 DIAGNOSIS — E875 Hyperkalemia: Secondary | ICD-10-CM | POA: Diagnosis not present

## 2020-05-01 DIAGNOSIS — D649 Anemia, unspecified: Secondary | ICD-10-CM

## 2020-05-01 DIAGNOSIS — E782 Mixed hyperlipidemia: Secondary | ICD-10-CM

## 2020-05-01 NOTE — Patient Instructions (Addendum)
Medication Instructions:  Continue your current medications. We have requested a copy of your medication list from [facility].  *If you need a refill on your cardiac medications before your next appointment, please call your pharmacy*  Lab Work: None ordered today.   We have requested recent lab work from [facility].  Testing/Procedures: None ordered today.  Follow-Up: At [facility], you and your health needs are our priority.  As part of our continuing mission to provide you with exceptional heart care, we have created designated Provider Care Teams.  These Care Teams include your primary Cardiologist (physician) and Advanced Practice Providers (APPs -  Physician Assistants and Nurse Practitioners) who all work together to provide you with the care you need, when you need it.  We recommend signing up for the patient portal called "MyChart".  Sign up information is provided on this After Visit Summary.  MyChart is used to connect with patients for Virtual Visits (Telemedicine).  Patients are able to view lab/test results, encounter notes, upcoming appointments, etc.  Non-urgent messages can be sent to your provider as well.   To learn more about what you can do with MyChart, go to [email or web address].    Your next appointment:   10/08/20 as scheduled with Dr. Rockey Situ  Other Instructions  When you are home from [facility], recommend checking your blood pressure 2-3 times per week and keeping a log. Please bring that log to your next appointment with Dr. Rockey Situ.

## 2020-05-01 NOTE — Telephone Encounter (Signed)
  Patient Consent for Virtual Visit         Amy [name] has provided verbal consent on 05/01/2020 for a virtual visit (video or telephone).   CONSENT FOR VIRTUAL VISIT FOR:  Amy [name]  By participating in this virtual visit I agree to the following:  I hereby voluntarily request, consent and authorize [facility] and its employed or contracted physicians, Engineer, materials, nurse practitioners or other licensed health care professionals (the Practitioner), to provide me with telemedicine health care services (the "Services") as deemed necessary by the treating Practitioner. I acknowledge and consent to receive the Services by the Practitioner via telemedicine. I understand that the telemedicine visit will involve communicating with the Practitioner through live audiovisual communication technology and the disclosure of certain medical information by electronic transmission. I acknowledge that I have been given the opportunity to request an in-person assessment or other available alternative prior to the telemedicine visit and am voluntarily participating in the telemedicine visit.  I understand that I have the right to withhold or withdraw my consent to the use of telemedicine in the course of my care at any time, without affecting my right to future care or treatment, and that the Practitioner or I may terminate the telemedicine visit at any time. I understand that I have the right to inspect all information obtained and/or recorded in the course of the telemedicine visit and may receive copies of available information for a reasonable fee.  I understand that some of the potential risks of receiving the Services via telemedicine include:  Marland Kitchen Delay or interruption in medical evaluation due to technological equipment failure or disruption; . Information transmitted may not be sufficient (e.g. poor resolution of images) to allow for appropriate medical decision making by the  Practitioner; and/or  . In rare instances, security protocols could fail, causing a breach of personal health information.  Furthermore, I acknowledge that it is my responsibility to provide information about my medical history, conditions and care that is complete and accurate to the best of my ability. I acknowledge that Practitioner's advice, recommendations, and/or decision may be based on factors not within their control, such as incomplete or inaccurate data provided by me or distortions of diagnostic images or specimens that may result from electronic transmissions. I understand that the practice of medicine is not an exact science and that Practitioner makes no warranties or guarantees regarding treatment outcomes. I acknowledge that a copy of this consent can be made available to me via my patient portal ([facility]), or I can request a printed copy by calling the office of [facility].    I understand that my insurance will be billed for this visit.   I have read or had this consent read to me. . I understand the contents of this consent, which adequately explains the benefits and risks of the Services being provided via telemedicine.  . I have been provided ample opportunity to ask questions regarding this consent and the Services and have had my questions answered to my satisfaction. . I give my informed consent for the services to be provided through the use of telemedicine in my medical care

## 2020-05-01 NOTE — Progress Notes (Signed)
Virtual Visit via Video Note   This visit type was conducted due to national recommendations for restrictions regarding the COVID-19 Pandemic (e.g. social distancing) in an effort to limit this patient's exposure and mitigate transmission in our community.  Due to her co-morbid illnesses, this patient is at least at moderate risk for complications without adequate follow up.  This format is felt to be most appropriate for this patient at this time.  All issues noted in this document were discussed and addressed.  A limited physical exam was performed with this format.  Please refer to the patient's chart for her consent to telehealth for [facility].  Date:  05/01/2020   ID:  Amy [name], DOB April 04, 1927, MRN [id number] The patient was identified using 2 identifiers.  Patient Location: [facility] Provider Location: Office/Clinic   PCP:  [name], MD   [facility]  Cardiologist:  Ida Rogue, MD   Evaluation Performed:  Follow-Up Visit  Chief Complaint: Hospital follow-up  History of Present Illness:    Amy [name] is a 87 y.o. female with coronary artery disease s/p stent to LAD, dementia,  COPD, HTN, morbid obesity, bradycardia with subsequent discontinuation of beta-blocker, hyperkalemia with secondary discontinuation of losartan, diastolic heart failure, [id number] 03/06/20, PAF not on anticoagulation due to high fall risk, CKD, anemia.  She was last seen by Dr. Rockey Situ 04/02/2020.  She was hospitalized 04/15/2020-04/24/2020 due to unwitnessed fall and CHF.  Developed hematoma of the back of head.  She has known history of complicated right arm fracture with conservative treatment following with Dr. Roland Rack of Ortho.  He was admitted due to hyperkalemia which was corrected after treatment with insulin, dextrose, calcium gluconate, Lokelma.  She was discharged on Lokelma at 10 g every other day.  Follow-up today via video visit with  the assistance of her son.  She is at [facility] and enjoying participating in rehab.  Tells me she feels her strength is improving.  Denies lightheadedness, dizziness, near syncope.  Reports no shortness of breath at rest and tells me her dyspnea on exertion is stable at her baseline.  She reports no chest pain, pressure, tightness.  Tells me her right arm pain is similar compared to previous and she continues to follow with orthopedics.  She is uncertain what medication she is taking and what her recent blood pressure readings have been, we have requested copies from the SNF.  Past Medical History:  Diagnosis Date  . Bladder incontinence   . CAD (coronary artery disease)    a. 05/2009 Cath: LAD 90p (Xience 2.75 x 12 mm DES), 30m, D1 40, LCx 40p/m, RCA 30/40/30p/m, RCA 30/25d;  b.  12/2011 Lexiscan MV: no ischemia, breast attenuation artifact, normal EF-->Low risk; c. 10/2016 MV: fixed apical defect, most likely apical thinning and attenuation, No ischemia, EF 60%.  . Cancer ([place])    ovarian  . Carotid arterial disease w/ R Carotid Bruit (HCC)    a. 08/2016 Carotid U/S: 40-59% bilat ICA stenosis - f/u 1 yr.  . Chronic diastolic (congestive) heart failure ([place])    a. Echo 09/2015: EF 55-60% w/ Grade 1 DD, sev Ca2+ MV annulus, mildly dil LA; b. 09/2016 Echo: EF 65-70%, Gr2 DD, mildly dil LA/RA, nl RV fxn.  . Chronic Dyspnea on exertion   . CKD (chronic kidney disease) stage 3, GFR 30-59 ml/min (HCC)   . Degenerative arthritis of knee    bilateral knees  . Diabetes mellitus  Type II  . GIB (gastrointestinal bleeding)    a. 08/2016 Admission w/ presyncope/anemia/melena-->req Transfusion-->endo ok, colonoscopy w/ polyps but no source of bleeding (most likely diverticular).  . Hiatal hernia   . Hypertension   . Iron deficiency   . Menopausal symptoms   . Morbid obesity ([place])   . Renal insufficiency   . Thyroid disease    hypothyroidism   Past Surgical History:  Procedure Laterality Date  .  COLONOSCOPY  2015  . COLONOSCOPY WITH PROPOFOL N/A 09/25/2016   Procedure: COLONOSCOPY WITH PROPOFOL;  Surgeon: [name], MD;  Location: [facility] ENDOSCOPY;  Service: Gastroenterology;  Laterality: N/A;  . COLONOSCOPY WITH PROPOFOL N/A 09/26/2016   Procedure: COLONOSCOPY WITH PROPOFOL;  Surgeon: [name], MD;  Location: [facility] ENDOSCOPY;  Service: Gastroenterology;  Laterality: N/A;  . ESOPHAGOGASTRODUODENOSCOPY (EGD) WITH PROPOFOL N/A 09/25/2016   Procedure: ESOPHAGOGASTRODUODENOSCOPY (EGD) WITH PROPOFOL;  Surgeon: [name], MD;  Location: [facility] ENDOSCOPY;  Service: Gastroenterology;  Laterality: N/A;  . RENAL ANGIOGRAPHY N/A 02/07/2019   Procedure: RENAL ANGIOGRAPHY;  Surgeon: [name], MD;  Location: [place] CV LAB;  Service: Cardiovascular;  Laterality: N/A;  . REPLACEMENT TOTAL KNEE BILATERAL    . TOTAL VAGINAL HYSTERECTOMY     ovarian mass, not cancerous  . UPPER GI ENDOSCOPY  2015     Current Meds  Medication Sig  . acetaminophen (TYLENOL) 500 MG tablet Take 1,000 mg by mouth 3 (three) times daily as needed for mild pain or moderate pain.  Marland Kitchen albuterol (VENTOLIN HFA) 108 (90 Base) MCG/ACT inhaler Inhale into the lungs every 6 (six) hours as needed for wheezing or shortness of breath.  Marland Kitchen amitriptyline (ELAVIL) 10 MG tablet Take 10 mg by mouth at bedtime.  . clopidogrel (PLAVIX) 75 MG tablet Take 1 tablet (75 mg total) by mouth daily.  Marland Kitchen docusate sodium (COLACE) 100 MG capsule Take 1 capsule (100 mg total) by mouth 2 (two) times daily.  Marland Kitchen doxazosin (CARDURA) 2 MG tablet Take by mouth 2 (two) times daily.  . iron polysaccharides (NIFEREX) 150 MG capsule Take 1 capsule (150 mg total) by mouth daily.  Marland Kitchen levETIRAcetam (KEPPRA) 500 MG tablet Take 1 tablet (500 mg total) by mouth 2 (two) times daily.  Marland Kitchen levothyroxine (SYNTHROID) 88 MCG tablet 88 mcg daily before breakfast.   . lisinopril (ZESTRIL) 2.5 MG tablet Take 2.5 mg by mouth daily.  . melatonin 5 MG TABS Take 1 tablet (5 mg  total) by mouth at bedtime as needed (sleep).  . montelukast (SINGULAIR) 10 MG tablet Take 10 mg by mouth at bedtime.   . nitroGLYCERIN (NITROSTAT) 0.4 MG SL tablet Place 1 tablet (0.4 mg total) under the tongue every 5 (five) minutes x 3 doses as needed for chest pain.  . pantoprazole (PROTONIX) 20 MG tablet Take 20 mg by mouth daily.   . polyethylene glycol (MIRALAX / GLYCOLAX) 17 g packet Take 17 g by mouth daily.  . pregabalin (LYRICA) 50 MG capsule Take 50-100 mg by mouth See admin instructions. Take 1 capsule (50mg ) by mouth every morning and take 2 capsules (100mg ) by mouth every night at bedtime  . simvastatin (ZOCOR) 40 MG tablet Take 1 tablet (40 mg total) by mouth at bedtime.  . sodium zirconium cyclosilicate (LOKELMA) 10 g PACK packet Take 10 g by mouth every other day.  . SYMBICORT 160-4.5 MCG/ACT inhaler Inhale 2 puffs into the lungs 2 (two) times daily.   Marland Kitchen torsemide (DEMADEX) 20 MG tablet Take 20 mg by  mouth daily.  . traMADol (ULTRAM) 50 MG tablet Take 50 mg by mouth every 6 (six) hours as needed.     Allergies:   Atenolol, Codeine, Losartan, Morphine and related, Nsaids, Rofecoxib, Sucralfate, and Sulfa antibiotics   Social History   Tobacco Use  . Smoking status: Never Smoker  . Smokeless tobacco: Never Used  Vaping Use  . Vaping Use: Never used  Substance Use Topics  . Alcohol use: No  . Drug use: No     Family Hx: The patient's family history includes Breast cancer (age of onset: 81) in her mother.  ROS:   Please see the history of present illness.     All other systems reviewed and are negative.   Prior CV studies:   The following studies were reviewed today: Echo 02/07/20  1. Left ventricular ejection fraction, by estimation, is 55 to 60%. The  left ventricle has normal function. The left ventricle has no regional  wall motion abnormalities. There is mild left ventricular hypertrophy.  Left ventricular diastolic parameters  are consistent with Grade  II diastolic dysfunction (pseudonormalization).  Elevated left atrial pressure.   2. Right ventricular systolic function is normal. The right ventricular  size is mildly enlarged. There is mildly elevated pulmonary artery  systolic pressure.   3. Left atrial size was mildly dilated.   4. Right atrial size was mildly dilated.   5. The mitral valve is abnormal. Trivial mitral valve regurgitation. Mild  to moderate mitral stenosis. Severe mitral annular calcification.   6. The aortic valve is tricuspid. There is mild calcification of the  aortic valve. There is mild thickening of the aortic valve. Aortic valve  regurgitation is trivial. Mild aortic valve stenosis. Aortic valve mean  gradient measures 12.0 mmHg.   7. The inferior vena cava is normal in size with <50% respiratory  variability, suggesting right atrial pressure of 8 mmHg   Labs/Other Tests and Data Reviewed:    EKG:  No ECG reviewed.  Recent Labs: 02/03/2020: TSH 19.762 03/07/2020: Magnesium 2.1 04/23/2020: ALT 11; B Natriuretic Peptide 370.5 04/24/2020: BUN 65; Creatinine, Ser 1.76; Hemoglobin 9.3; Platelets 147; Potassium 5.5; Sodium 139   Recent Lipid Panel Lab Results  Component Value Date/Time   CHOL 110 08/19/2019 06:41 AM   TRIG 55 08/19/2019 06:41 AM   TRIG 194 05/07/2009 12:00 AM   HDL 37 (L) 08/19/2019 06:41 AM   CHOLHDL 3.0 08/19/2019 06:41 AM   LDLCALC 62 08/19/2019 06:41 AM    Wt Readings from Last 3 Encounters:  05/01/20 175 lb (79.4 kg)  04/23/20 179 lb 10.8 oz (81.5 kg)  04/02/20 172 lb (78 kg)      Objective:    Vital Signs:  Ht 5\' 2"  (1.575 m)   Wt 175 lb (79.4 kg)   BMI 32.01 kg/m    VITAL SIGNS:  reviewed GEN:  no acute distress RESPIRATORY:  normal respiratory effort, symmetric expansion CARDIOVASCULAR:  no peripheral edema MUSCULOSKELETAL:  no obvious deformities. NEURO:  alert and oriented x 3, no obvious focal deficit PSYCH:  normal affect  ASSESSMENT & PLAN:     1. Hyperkalemia -likely precipitated by CKD.  She is not on any agents that would cause hyperkalemia.  She has been started on Lokelma at hospital discharge.  Await paperwork from SNF to determine if she is still taking and if BMP has been rechecked.  2. HTN-I have requested vital signs from the facility where she is staying.  We will need to review  prior to making any recommendations.  She denies lightheadedness, dizziness, near-syncope, syncope.  3. Chronic diastolic heart failure-euvolemic from evaluation on video visit.   Recommend low-salt diet less than 2 L fluid restriction.  GDMT includes lisinopril, torsemide.  Reports no edema, orthopnea.  4. PAF not on anticoagulation- Denies palpitations, no evidence of recurrent atrial fibrillation. No trequiring AV nodal blocking agent.  No anticoagulation secondary to frequent falls per primary cardiologist.  5. CAD-no anginal symptoms.  No indication for ischemic evaluation at this time.  GDMT includes Plavix, simvastatin.  6. CKD-continue to follow nephrology.  Careful titration of diuretics and antihypertensives.  Recommend avoidance of nephrotoxic agents.  7. Anemia-continue to follow with hematology.   Time:   Today, I have spent 11 minutes with the patient with telehealth technology discussing the above problems.     Medication Adjustments/Labs and Tests Ordered: Current medicines are reviewed at length with the patient today.  Concerns regarding medicines are outlined above.   Tests Ordered: No orders of the defined types were placed in this encounter.   Medication Changes: No orders of the defined types were placed in this encounter.   Follow Up:  In Person 09/2020 as scheduled with Dr. Rockey Situ  Signed, Loel Dubonnet, NP  05/01/2020 2:15 PM    [place]

## 2020-05-02 ENCOUNTER — Ambulatory Visit: Payer: Medicare Other | Admitting: Family

## 2020-05-03 ENCOUNTER — Other Ambulatory Visit: Payer: Self-pay

## 2020-05-03 ENCOUNTER — Non-Acute Institutional Stay: Payer: Medicare Other | Admitting: Primary Care

## 2020-05-03 DIAGNOSIS — S42301D Unspecified fracture of shaft of humerus, right arm, subsequent encounter for fracture with routine healing: Secondary | ICD-10-CM

## 2020-05-03 DIAGNOSIS — Z515 Encounter for palliative care: Secondary | ICD-10-CM

## 2020-05-03 DIAGNOSIS — I5033 Acute on chronic diastolic (congestive) heart failure: Secondary | ICD-10-CM

## 2020-05-03 DIAGNOSIS — E875 Hyperkalemia: Secondary | ICD-10-CM

## 2020-05-03 NOTE — Progress Notes (Signed)
  [facility] Consult Note Telephone: [phone]  Fax: [phone]    Date of encounter: 05/03/20 PATIENT NAME: Amy [name] [address] [place] [address] [phone] (home)  DOB: 01/17/1928 MRN: [id number]  PRIMARY CARE PROVIDER:    Slade-Hartman, Venezela, MD [address] [place] [address] [phone]  REFERRING PROVIDER:   Slade-Hartman, Venezela, MD [address] [place]  [address] [phone]  RESPONSIBLE PARTY:   Extended Emergency Contact Information Primary Emergency Contact: Sida,Sue  [place] Home Phone: [phone] Relation: Daughter Secondary Emergency Contact: Diguglielmo,eddie Mobile Phone: [phone] Relation: Son  I met face to face with patient  in facility. Palliative Care was asked to follow this by consultation request of  to help address advance care planning and goals of care. This is a follow up  visit.  ASSESSMENT AND RECOMMENDATIONS:   1. Advance Care Planning/Goals of Care: Goals include to maximize quality of life and symptom management. Our advance care planning conversation included a discussion about:     Experiences with loved ones who have been seriously ill or have died   Exploration of personal, cultural or spiritual beliefs that might influence medical decisions   Exploration of goals of care in the event of a sudden injury or illness   Review  of an  advance directive document. DNR, no changes in MOST   Spoke with daughter Sue who is POA. States her mother had a torn rotator cuff and cannot have surgery. This has increased her immobility and dependence. She said patient still spoke of returning home but would need ATC care, which is not possible.    2. Symptom Management:   Cough: Daughter reported cough from visit this am but this writer did not witness. Continue to monitor respiratory status.   Pain: Denies at rest, has  arm sling on although it is not supporting while in chair. Rx with PRN OTC analgesia.  Edema: Some edema in R hand, elevated on pillow, hand  warm, + pulses  Falls: Healing from sequelae of fall several weeks ago. No new reports. Sustained laceration on head and arm. Employ fall prevention measures.  Hyperkalemia: Has decreased from hospital 6.9 with use of Lokelma, now WNL per SNF staff.  3. Follow up Palliative Care Visit: Palliative care will continue to follow for goals of care clarification and symptom management. Return 4-6 weeks or prn.  4. Family /Caregiver/Community Supports: Daughter and son are involved in care, pt is in SNF, potentially LTC.  5. Cognitive / Functional decline: A and O x 2. Dependent in adls, cannot ambulate.  I spent 35 minutes providing this consultation,  from 1400 to 1435. More than 50% of the time in this consultation was spent in counseling and care coordination.  CODE STATUS: DNR  PPS: 30%  HOSPICE ELIGIBILITY/DIAGNOSIS: TBD  Subjective:  CHIEF COMPLAINT: immobility  HISTORY OF PRESENT ILLNESS:  Amy [name] is a 87 y.o. year old female  with Immobility, R shoulder injury, fall risk, CAD, COPD, h/o fx. She presents after hospital stay following fall and lacerations of head and arm. She is alert and states fatigue, denies pain.   We are asked to consult around advance care planning and complex medical decision making.    Review and summarization of old Epic records shows or history from other than patient.  Review or lab tests, radiology,  or medicine. From hospital stay, recent hyper kalemia, imaging RE shoulder injury Review of   case with family member. Sue   History obtained from review of EMR, discussion with primary team, and  interview with family, caregiver  and/or Ms. Hartshorne. Records reviewed and summarized above.   CURRENT PROBLEM LIST:  Patient Active Problem List   Diagnosis Date Noted  . Fall 04/23/2020  . Closed right  arm fracture 04/23/2020  . T3 vertebral fracture (HCC) 04/23/2020  . Closed nondisplaced fracture of head of right radius   . Atrial fibrillation (HCC) 03/06/2020  . COVID-19 virus infection 03/06/2020  . Seizure (HCC) 03/06/2020  . New onset seizure (HCC) 03/06/2020  . Acute on chronic diastolic heart failure (HCC) 02/02/2020  . Moderate persistent asthma 02/02/2020  . SOB (shortness of breath) 02/02/2020  . Anemia associated with chronic renal failure 10/22/2019  . Postherpetic neuralgia 10/18/2019  . Neuropathic pain of chest 10/18/2019  . Chronic pain syndrome 10/18/2019  . NSTEMI (non-ST elevated myocardial infarction) (HCC) 08/18/2019  . Syncope and collapse 08/13/2019  . Unresponsiveness 08/12/2019  . Hyperkalemia 08/12/2019  . HTN (hypertension) 08/12/2019  . GERD (gastroesophageal reflux disease) 08/12/2019  . Hypothyroidism 08/12/2019  . CAD (coronary artery disease) 08/12/2019  . Shingles 08/12/2019  . Carotid arterial disease (HCC)   . Hypotension   . Groin pain, right   . Chest pain 08/06/2019  . Renovascular hypertension 03/08/2019  . Renal artery stenosis (HCC) 01/28/2019  . Multiple renal cysts 12/29/2018  . Renal stone 12/29/2018  . CKD (chronic kidney disease) stage 4, GFR 15-29 ml/min (HCC) 11/23/2018  . Diabetes (HCC) 06/29/2017  . CAP (community acquired pneumonia) 05/16/2017  . GI bleed 09/21/2016  . Vitamin D deficiency 11/04/2015  . Multiple lung nodules   . COPD with chronic bronchitis (HCC)   . New onset atrial fibrillation (HCC) 10/04/2015  . Anemia 10/04/2015  . Acute kidney injury superimposed on CKD (HCC) 10/03/2015  . Palliative care encounter 06/05/2015  . Morbid obesity (HCC) 12/05/2014  . Angina pectoris associated with type 2 diabetes mellitus (HCC) 12/05/2014  . Type 2 diabetes mellitus with hyperglycemia (HCC) 08/22/2014  . Chronic diastolic CHF (congestive heart failure) (HCC) 09/06/2013  . Preop cardiovascular exam 06/03/2012  .  Iron deficiency anemia 09/08/2011  . HLD (hyperlipidemia) 07/18/2009  . Coronary artery disease involving native coronary artery of native heart without angina pectoris 07/18/2009  . Acquired hypothyroidism 07/12/2009  . Essential hypertension 07/12/2009   PAST MEDICAL HISTORY:  Active Ambulatory Problems    Diagnosis Date Noted  . Acquired hypothyroidism 07/12/2009  . HLD (hyperlipidemia) 07/18/2009  . Essential hypertension 07/12/2009  . Coronary artery disease involving native coronary artery of native heart without angina pectoris 07/18/2009  . Iron deficiency anemia 09/08/2011  . Preop cardiovascular exam 06/03/2012  . Chronic diastolic CHF (congestive heart failure) (HCC) 09/06/2013  . Morbid obesity (HCC) 12/05/2014  . Angina pectoris associated with type 2 diabetes mellitus (HCC) 12/05/2014  . Palliative care encounter 06/05/2015  . Acute kidney injury superimposed on CKD (HCC) 10/03/2015  . New onset atrial fibrillation (HCC) 10/04/2015  . Anemia 10/04/2015  . COPD with chronic bronchitis (HCC)   . Multiple lung nodules   . Vitamin D deficiency 11/04/2015  . GI bleed 09/21/2016  . CAP (community acquired pneumonia) 05/16/2017  . Diabetes (HCC) 06/29/2017  . CKD (chronic kidney disease) stage 4, GFR 15-29 ml/min (HCC) 11/23/2018  . Multiple renal cysts 12/29/2018  . Renal stone 12/29/2018  . Type 2 diabetes mellitus with hyperglycemia (HCC) 08/22/2014  . Renal artery stenosis (HCC) 01/28/2019  .   Renovascular hypertension 03/08/2019  . Chest pain 08/06/2019  . Unresponsiveness 08/12/2019  . Hyperkalemia 08/12/2019  . HTN (hypertension) 08/12/2019  . GERD (gastroesophageal reflux disease) 08/12/2019  . Hypothyroidism 08/12/2019  . CAD (coronary artery disease) 08/12/2019  . Carotid arterial disease ([place])   . Hypotension   . Shingles 08/12/2019  . Groin pain, right   . Syncope and collapse 08/13/2019  . NSTEMI (non-ST elevated myocardial infarction) ([place]) 08/18/2019   . Postherpetic neuralgia 10/18/2019  . Neuropathic pain of chest 10/18/2019  . Chronic pain syndrome 10/18/2019  . Anemia associated with chronic renal failure 10/22/2019  . Acute on chronic diastolic heart failure ([place]) 02/02/2020  . Moderate persistent asthma 02/02/2020  . SOB (shortness of breath) 02/02/2020  . Atrial fibrillation ([place]) 03/06/2020  . COVID-19 virus infection 03/06/2020  . Seizure ([place]) 03/06/2020  . New onset seizure ([place]) 03/06/2020  . Closed nondisplaced fracture of head of right radius   . Fall 04/23/2020  . Closed right arm fracture 04/23/2020  . T3 vertebral fracture ([place]) 04/23/2020   Resolved Ambulatory Problems    Diagnosis Date Noted  . DYSPNEA 01/21/2010  . Diabetes type 2, uncontrolled ([place]) 12/05/2014  . Acute on chronic diastolic (congestive) heart failure ([place]) 10/01/2015  . COPD exacerbation ([place]) 10/04/2015  . Acute bronchitis   . Sepsis ([place]) 05/16/2017   Past Medical History:  Diagnosis Date  . Bladder incontinence   . Cancer ([place])   . Chronic diastolic (congestive) heart failure ([place])   . Chronic Dyspnea on exertion   . CKD (chronic kidney disease) stage 3, GFR 30-59 ml/min (HCC)   . Degenerative arthritis of knee   . Diabetes mellitus   . GIB (gastrointestinal bleeding)   . Hiatal hernia   . Hypertension   . Iron deficiency   . Menopausal symptoms   . Renal insufficiency   . Thyroid disease    SOCIAL HX:  Social History   Tobacco Use  . Smoking status: Never Smoker  . Smokeless tobacco: Never Used  Substance Use Topics  . Alcohol use: No   FAMILY HX:  Family History  Problem Relation Age of Onset  . Breast cancer Mother 71      ALLERGIES:  Allergies  Allergen Reactions  . Atenolol Other (See Comments)    Other reaction(s): Other (See Comments) Decreased heart rate Decreased heart rate  . Codeine Shortness Of Breath    Rash, difficulty breathing, nausea.  . Losartan Other (See Comments)    Hyperkalemia  .  Morphine And Related Shortness Of Breath    Rash, difficulty breathing, nausea.   . Nsaids Other (See Comments)    Other reaction(s): Unknown  . Rofecoxib     Other reaction(s): Unknown  . Sucralfate Other (See Comments)    Throat tightness  . Sulfa Antibiotics     Other reaction(s): Unknown     PERTINENT MEDICATIONS:  Outpatient Encounter Medications as of 05/03/2020  Medication Sig  . levothyroxine (SYNTHROID) 88 MCG tablet 88 mcg daily before breakfast.   . acetaminophen (TYLENOL) 500 MG tablet Take 1,000 mg by mouth 3 (three) times daily as needed for mild pain or moderate pain.  Marland Kitchen albuterol (VENTOLIN HFA) 108 (90 Base) MCG/ACT inhaler Inhale into the lungs every 6 (six) hours as needed for wheezing or shortness of breath.  Marland Kitchen amitriptyline (ELAVIL) 10 MG tablet Take 10 mg by mouth at bedtime.  . clopidogrel (PLAVIX) 75 MG tablet Take 1 tablet (75 mg total) by mouth daily.  Marland Kitchen  docusate sodium (COLACE) 100 MG capsule Take 1 capsule (100 mg total) by mouth 2 (two) times daily.  Marland Kitchen doxazosin (CARDURA) 2 MG tablet Take by mouth 2 (two) times daily.  . iron polysaccharides (NIFEREX) 150 MG capsule Take 1 capsule (150 mg total) by mouth daily.  Marland Kitchen levETIRAcetam (KEPPRA) 500 MG tablet Take 1 tablet (500 mg total) by mouth 2 (two) times daily.  Marland Kitchen lisinopril (ZESTRIL) 2.5 MG tablet Take 2.5 mg by mouth daily.  . melatonin 5 MG TABS Take 1 tablet (5 mg total) by mouth at bedtime as needed (sleep).  . montelukast (SINGULAIR) 10 MG tablet Take 10 mg by mouth at bedtime.   . nitroGLYCERIN (NITROSTAT) 0.4 MG SL tablet Place 1 tablet (0.4 mg total) under the tongue every 5 (five) minutes x 3 doses as needed for chest pain.  . pantoprazole (PROTONIX) 20 MG tablet Take 20 mg by mouth daily.   . polyethylene glycol (MIRALAX / GLYCOLAX) 17 g packet Take 17 g by mouth daily.  . pregabalin (LYRICA) 50 MG capsule Take 50-100 mg by mouth See admin instructions. Take 1 capsule (25m) by mouth every morning and  take 2 capsules (1065m by mouth every night at bedtime  . simvastatin (ZOCOR) 40 MG tablet Take 1 tablet (40 mg total) by mouth at bedtime.  . sodium zirconium cyclosilicate (LOKELMA) 10 g PACK packet Take 10 g by mouth every other day.  . SYMBICORT 160-4.5 MCG/ACT inhaler Inhale 2 puffs into the lungs 2 (two) times daily.   . Marland Kitchenorsemide (DEMADEX) 20 MG tablet Take 20 mg by mouth daily.  . traMADol (ULTRAM) 50 MG tablet Take 50 mg by mouth every 6 (six) hours as needed.   Facility-Administered Encounter Medications as of 05/03/2020  Medication  . 0.9 %  sodium chloride infusion  . epoetin alfa-epbx (RETACRIT) injection 40,000 Units    Objective: ROS   General: NAD ENMT: denies dysphagia Cardiovascular: denies chest pain Pulmonary: denies  cough, denies increased SOB Abdomen: endorses fair appetite, denies  constipation, endorses continence of bowel GU: denies dysuria, endorses continence of urine MSK:  endorses ROM limitations, recent falls reported Skin: L FA healing laceration Neurological: endorses weakness, endorses occ pain, denies insomnia Psych: Endorses positive mood Heme/lymph/immuno: denies bruises, abnormal bleeding  Physical Exam: Current and past weights: 175 lbs Constitutional: NAD General: frail appearing EYES: anicteric sclera, lids intact, no discharge  ENMT: intact hearing,oral mucous membranes moist Pulmonary: no increased work of breathing, no cough, no audible wheezes, room air (oxygen prn) Abdomen: intake 75%, no ascites GU: deferred MSK: mod sarcopenia, decreased ROM in all extremities, no contractures of LE, non ambulatory Skin: warm and dry Neuro: Generalized weakness, moderate cognitive impairment Psych: non-anxious affect, A and O x 2 Hem/lymph/immuno: no widespread bruising   Thank you for the opportunity to participate in the care of Ms. Chamberlain.  The palliative care team will continue to follow. Please call our office at [phone] we  can be of additional assistance.  KaJason CoopNP , DNP, MPH, AGPCNP-BC, ACHPN   COVID-19 PATIENT SCREENING TOOL  Person answering questions: _______staff____________   1.  Is the patient or any family member in the home showing any signs or symptoms regarding respiratory infection?                  Person with Symptom  ______________na___________ a. Fever/chills/headache  Yes___ No__X_            b. Shortness of breath                                                            Yes___ No__X_           c. Cough/congestion                                               Yes___  No__X_          d. Muscle/Body aches/pains                                                   Yes___ No__X_         e. Gastrointestinal symptoms (diarrhea,nausea)             Yes___ No__X_         f. Sudden loss of smell or taste      Yes___ No__X_        2. Within the past 10 days, has anyone living in the home had any contact with someone with or under investigation for COVID-19?    Yes___ No__X__   Person __________________

## 2020-05-04 ENCOUNTER — Ambulatory Visit: Payer: Medicare Other | Admitting: Cardiovascular Disease

## 2020-05-04 ENCOUNTER — Non-Acute Institutional Stay: Payer: Medicare Other | Admitting: Primary Care

## 2020-05-07 NOTE — ED Provider Notes (Signed)
[facility] Emergency Department Provider Note   ____________________________________________   Event Date/Time   First MD Initiated Contact with Patient 04/23/20 [phone]     (approximate)  I have reviewed the triage vital signs and the nursing notes.   HISTORY  Chief Complaint Fall    HPI Amy [name] is a 87 y.o. female with a stated past medical history of CAD, CHF, type 2 diabetes, hypertension, and hypothyroidism [facility] with chief complaint of fall with right arm pain.  EMS states that patient's fall was unwitnessed and patient is demented at baseline and unable to participate in history.  EMS states that patient does not show any blood thinners on her medication record.         Past Medical History:  Diagnosis Date  . Bladder incontinence   . CAD (coronary artery disease)    a. 05/2009 Cath: LAD 90p (Xience 2.75 x 12 mm DES), 31m, D1 40, LCx 40p/m, RCA 30/40/30p/m, RCA 30/25d;  b.  12/2011 Lexiscan MV: no ischemia, breast attenuation artifact, normal EF-->Low risk; c. 10/2016 MV: fixed apical defect, most likely apical thinning and attenuation, No ischemia, EF 60%.  . Cancer ([place])    ovarian  . Carotid arterial disease w/ R Carotid Bruit (HCC)    a. 08/2016 Carotid U/S: 40-59% bilat ICA stenosis - f/u 1 yr.  . Chronic diastolic (congestive) heart failure ([place])    a. Echo 09/2015: EF 55-60% w/ Grade 1 DD, sev Ca2+ MV annulus, mildly dil LA; b. 09/2016 Echo: EF 65-70%, Gr2 DD, mildly dil LA/RA, nl RV fxn.  . Chronic Dyspnea on exertion   . CKD (chronic kidney disease) stage 3, GFR 30-59 ml/min (HCC)   . Degenerative arthritis of knee    bilateral knees  . Diabetes mellitus    Type II  . GIB (gastrointestinal bleeding)    a. 08/2016 Admission w/ presyncope/anemia/melena-->req Transfusion-->endo ok, colonoscopy w/ polyps but no source of bleeding (most likely diverticular).  . Hiatal hernia    . Hypertension   . Iron deficiency   . Menopausal symptoms   . Morbid obesity ([place])   . Renal insufficiency   . Thyroid disease    hypothyroidism    Patient Active Problem List   Diagnosis Date Noted  . Fall 04/23/2020  . Closed right arm fracture 04/23/2020  . T3 vertebral fracture ([place]) 04/23/2020  . Closed nondisplaced fracture of head of right radius   . Atrial fibrillation ([place]) 03/06/2020  . COVID-19 virus infection 03/06/2020  . Seizure ([place]) 03/06/2020  . New onset seizure ([place]) 03/06/2020  . Acute on chronic diastolic heart failure ([place]) 02/02/2020  . Moderate persistent asthma 02/02/2020  . SOB (shortness of breath) 02/02/2020  . Anemia associated with chronic renal failure 10/22/2019  . Postherpetic neuralgia 10/18/2019  . Neuropathic pain of chest 10/18/2019  . Chronic pain syndrome 10/18/2019  . NSTEMI (non-ST elevated myocardial infarction) ([place]) 08/18/2019  . Syncope and collapse 08/13/2019  . Unresponsiveness 08/12/2019  . Hyperkalemia 08/12/2019  . HTN (hypertension) 08/12/2019  . GERD (gastroesophageal reflux disease) 08/12/2019  . Hypothyroidism 08/12/2019  . CAD (coronary artery disease) 08/12/2019  . Shingles 08/12/2019  . Carotid arterial disease ([place])   . Hypotension   . Groin pain, right   . Chest pain 08/06/2019  . Renovascular hypertension 03/08/2019  . Renal artery stenosis ([place]) 01/28/2019  . Multiple renal cysts 12/29/2018  . Renal stone 12/29/2018  . CKD (chronic  kidney disease) stage 4, GFR 15-29 ml/min (HCC) 11/23/2018  . Diabetes ([place]) 06/29/2017  . CAP (community acquired pneumonia) 05/16/2017  . GI bleed 09/21/2016  . Vitamin D deficiency 11/04/2015  . Multiple lung nodules   . COPD with chronic bronchitis ([place])   . New onset atrial fibrillation ([place]) 10/04/2015  . Anemia 10/04/2015  . Acute kidney injury superimposed on CKD ([place]) 10/03/2015  . Palliative care encounter 06/05/2015  . Morbid obesity ([place]) 12/05/2014  . Angina  pectoris associated with type 2 diabetes mellitus ([place]) 12/05/2014  . Type 2 diabetes mellitus with hyperglycemia ([place]) 08/22/2014  . Chronic diastolic CHF (congestive heart failure) ([place]) 09/06/2013  . Preop cardiovascular exam 06/03/2012  . Iron deficiency anemia 09/08/2011  . HLD (hyperlipidemia) 07/18/2009  . Coronary artery disease involving native coronary artery of native heart without angina pectoris 07/18/2009  . Acquired hypothyroidism 07/12/2009  . Essential hypertension 07/12/2009    Past Surgical History:  Procedure Laterality Date  . COLONOSCOPY  2015  . COLONOSCOPY WITH PROPOFOL N/A 09/25/2016   Procedure: COLONOSCOPY WITH PROPOFOL;  Surgeon: [name], MD;  Location: [facility] ENDOSCOPY;  Service: Gastroenterology;  Laterality: N/A;  . COLONOSCOPY WITH PROPOFOL N/A 09/26/2016   Procedure: COLONOSCOPY WITH PROPOFOL;  Surgeon: [name], MD;  Location: [facility] ENDOSCOPY;  Service: Gastroenterology;  Laterality: N/A;  . ESOPHAGOGASTRODUODENOSCOPY (EGD) WITH PROPOFOL N/A 09/25/2016   Procedure: ESOPHAGOGASTRODUODENOSCOPY (EGD) WITH PROPOFOL;  Surgeon: [name], MD;  Location[facility] ENDOSCOPY;  Service: Gastroenterology;  Laterality: N/A;  . RENAL ANGIOGRAPHY N/A 02/07/2019   Procedure: RENAL ANGIOGRAPHY;  Surgeon: [name], MD;  Location: [facility] CV LAB;  Service: Cardiovascular;  Laterality: N/A;  . REPLACEMENT TOTAL KNEE BILATERAL    . TOTAL VAGINAL HYSTERECTOMY     ovarian mass, not cancerous  . UPPER GI ENDOSCOPY  2015    Prior to Admission medications   Medication Sig Start Date End Date Taking? Authorizing Provider  acetaminophen (TYLENOL) 500 MG tablet Take 1,000 mg by mouth 3 (three) times daily as needed for mild pain or moderate pain.   Yes [provider]  albuterol (VENTOLIN HFA) 108 (90 Base) MCG/ACT inhaler Inhale into the lungs every 6 (six) hours as needed for wheezing or shortness of breath.   Yes [provider]  amitriptyline (ELAVIL)  10 MG tablet Take 10 mg by mouth at bedtime. 12/04/19  Yes [provider]  clopidogrel (PLAVIX) 75 MG tablet Take 1 tablet (75 mg total) by mouth daily. 09/27/16  Yes Dustin Flock, MD  docusate sodium (COLACE) 100 MG capsule Take 1 capsule (100 mg total) by mouth 2 (two) times daily. 03/08/20  Yes Fritzi Mandes, MD  doxazosin (CARDURA) 2 MG tablet Take by mouth 2 (two) times daily. 04/03/20  Yes [provider]  iron polysaccharides (NIFEREX) 150 MG capsule Take 1 capsule (150 mg total) by mouth daily. 02/15/20  Yes Enzo Bi, MD  levETIRAcetam (KEPPRA) 500 MG tablet Take 1 tablet (500 mg total) by mouth 2 (two) times daily. 03/08/20  Yes Fritzi Mandes, MD  levothyroxine (SYNTHROID) 88 MCG tablet 88 mcg daily before breakfast.  06/20/19  Yes [provider]  lisinopril (ZESTRIL) 2.5 MG tablet Take 2.5 mg by mouth daily. 01/10/20 01/09/21 Yes [provider]  melatonin 5 MG TABS Take 1 tablet (5 mg total) by mouth at bedtime as needed (sleep). 02/14/20  Yes Enzo Bi, MD  montelukast (SINGULAIR) 10 MG tablet Take 10 mg by mouth at bedtime.  03/24/19  Yes  [provider]  nitroGLYCERIN (NITROSTAT) 0.4 MG SL tablet Place 1 tablet (0.4 mg total) under the tongue every 5 (five) minutes x 3 doses as needed for chest pain. 08/23/19  Yes Enzo Bi, MD  pantoprazole (PROTONIX) 20 MG tablet Take 20 mg by mouth daily.    Yes [provider]  polyethylene glycol (MIRALAX / GLYCOLAX) 17 g packet Take 17 g by mouth daily. 03/08/20  Yes Fritzi Mandes, MD  pregabalin (LYRICA) 50 MG capsule Take 50-100 mg by mouth See admin instructions. Take 1 capsule (50mg ) by mouth every morning and take 2 capsules (100mg ) by mouth every night at bedtime   Yes [provider]  simvastatin (ZOCOR) 40 MG tablet Take 1 tablet (40 mg total) by mouth at bedtime. 03/22/18  Yes Minna Merritts, MD  SYMBICORT 160-4.5 MCG/ACT inhaler Inhale 2 puffs into the lungs 2 (two) times daily.   11/15/14  Yes [provider]  torsemide (DEMADEX) 20 MG tablet Take 20 mg by mouth daily.   Yes [provider]  traMADol (ULTRAM) 50 MG tablet Take 50 mg by mouth every 6 (six) hours as needed. 04/19/20  Yes [provider]  sodium zirconium cyclosilicate (LOKELMA) 10 g PACK packet Take 10 g by mouth every other day. 04/24/20   Sidney Ace, MD    Allergies Atenolol, Codeine, Losartan, Morphine and related, Nsaids, Rofecoxib, Sucralfate, and Sulfa antibiotics  Family History  Problem Relation Age of Onset  . Breast cancer Mother 38    Social History Social History   Tobacco Use  . Smoking status: Never Smoker  . Smokeless tobacco: Never Used  Vaping Use  . Vaping Use: Never used  Substance Use Topics  . Alcohol use: No  . Drug use: No    Review of Systems Unable to assess ____________________________________________   PHYSICAL EXAM:  VITAL SIGNS: ED Triage Vitals  Enc Vitals Group     BP 04/23/20 0511 (!) 177/46     Pulse Rate 04/23/20 0511 (!) 56     Resp 04/23/20 0511 18     Temp 04/23/20 0511 (!) 97.4 F (36.3 C)     Temp Source 04/23/20 0511 Oral     SpO2 04/23/20 0511 100 %     Weight 04/23/20 0513 179 lb 10.8 oz (81.5 kg)     Height --      Head Circumference --      Peak Flow --      Pain Score 04/23/20 0513 10     Pain Loc --      Pain Edu? --      Excl. in [place]? --    Constitutional: Alert and disoriented. Well appearing overweight elderly Caucasian female in no acute distress. Eyes: Conjunctivae are normal. PERRL. Head: Hematoma to the occipital scalp Nose: No congestion/rhinnorhea. Mouth/Throat: Mucous membranes are moist. Neck: No stridor Cardiovascular: Grossly normal heart sounds.  Good peripheral circulation. Respiratory: Normal respiratory effort.  No retractions. Gastrointestinal: Soft and nontender. No distention. Musculoskeletal: No obvious deformities Neurologic:  Normal speech and language. No gross  focal neurologic deficits are appreciated. Skin:  Skin is warm and dry. No rash noted. Psychiatric: Mood and affect are normal. Speech and behavior are normal.  ____________________________________________   LABS (all labs ordered are listed, but only abnormal results are displayed)  Labs Reviewed  RESP PANEL BY RT-PCR (FLU A&B, COVID) ARPGX2 - Abnormal; Notable for the following components:      Result Value   SARS Coronavirus  2 by RT PCR POSITIVE (*)    All other components within normal limits  CBC WITH DIFFERENTIAL/PLATELET - Abnormal; Notable for the following components:   RBC 3.09 (*)    Hemoglobin 9.8 (*)    HCT 29.6 (*)    RDW 15.6 (*)    Eosinophils Absolute 0.6 (*)    All other components within normal limits  COMPREHENSIVE METABOLIC PANEL - Abnormal; Notable for the following components:   Potassium 6.1 (*)    BUN 69 (*)    Creatinine, Ser 1.78 (*)    Calcium 8.7 (*)    Total Protein 5.8 (*)    Albumin 3.1 (*)    GFR, Estimated 27 (*)    All other components within normal limits  POTASSIUM - Abnormal; Notable for the following components:   Potassium 5.9 (*)    All other components within normal limits  BRAIN NATRIURETIC PEPTIDE - Abnormal; Notable for the following components:   B Natriuretic Peptide 370.5 (*)    All other components within normal limits  POTASSIUM - Abnormal; Notable for the following components:   Potassium 6.9 (*)    All other components within normal limits  BASIC METABOLIC PANEL - Abnormal; Notable for the following components:   Potassium 5.5 (*)    CO2 21 (*)    BUN 65 (*)    Creatinine, Ser 1.76 (*)    Calcium 8.6 (*)    GFR, Estimated 27 (*)    All other components within normal limits  CBC - Abnormal; Notable for the following components:   RBC 2.98 (*)    Hemoglobin 9.3 (*)    HCT 29.1 (*)    RDW 15.6 (*)    Platelets 147 (*)    All other components within normal limits  POTASSIUM - Abnormal; Notable for the following  components:   Potassium 5.7 (*)    All other components within normal limits  GLUCOSE, CAPILLARY - Abnormal; Notable for the following components:   Glucose-Capillary 122 (*)    All other components within normal limits  GLUCOSE, CAPILLARY   ____________________________________________  EKG  ED ECG REPORT I, Naaman Plummer, the attending physician, personally viewed and interpreted this ECG.  Date: 04/23/2020 EKG Time: 0522 Rate: 53 Rhythm: Junctional rhythm QRS Axis: normal Intervals: normal ST/T Wave abnormalities: normal Narrative Interpretation: no evidence of acute ischemia  ____________________________________________  RADIOLOGY  ED MD interpretation:  CT of the cervical spine without contrast shows a new T3 vertebral compression deformity  CT without contrast of the head shows a posterior scalp hematoma without any evidence of intracranial hematoma, calvarial fracture, or obvious masses  X-ray of the right humerus shows acute complete fracture involving anatomic neck of the right humerus  Official radiology report(s): No results found.  ____________________________________________   PROCEDURES  Procedure(s) performed (including Critical Care):  .1-3 Lead EKG Interpretation Performed by: Naaman Plummer, MD Authorized by: Naaman Plummer, MD     Interpretation: abnormal     ECG rate:  63   ECG rate assessment: normal     Rhythm: sinus rhythm     Ectopy: none     Conduction: normal   Comments:     Junctional rhythm      ____________________________________________   INITIAL IMPRESSION / ASSESSMENT AND PLAN / ED COURSE  As part of my medical decision making, I reviewed the following data within the [facility] notes reviewed and incorporated, Labs reviewed, EKG interpreted, Old chart reviewed,  Radiograph reviewed and Notes from prior ED visits reviewed and incorporated        Patient is a 87 year old female who presents  after a fall complaining of right arm pain Differential diagnosis for this patient includes but is not limited to: Intracranial hemorrhage, calvarial fracture, humerus fracture, radius/ulnar fracture Significant laboratory values: K6.1 Creatinine 1.78  Significant radiologic findings: New superior T3 endplate fracture Right radial fracture  Patient placed in splint for right radius fracture and consultation made to Dr. Roland Rack Given hyperkalemia and new AKI, patient will require admission to the internal medicine service for further evaluation and management  Dispo: Admit to medicine  Clinical Course as of 05/07/20 0442  Mon Apr 23, 2020  1610 Comprehensive metabolic panel(!) Patient CMP is notable for acute on chronic kidney disease with a creatinine of 1.78 and an elevated BUN of 69.  However she also shows a potassium of 6.1.  This is somewhat unexpected even with an elevated creatinine.  Her EKG shows a junctional rhythm with right bundle branch block and left posterior fascicular block.  Before I aggressively treat the hyperkalemia I will asked the nurse to send a repeat sample and I will repeat the potassium because I do not want to aggressively treated if it is erroneous. [CF]  [phone] The patient is also hurting and I am ordering fentanyl 50 mcg IV as well as Zofran 4 mg IV. [CF]    Clinical Course User Index [CF] Hinda Kehr, MD     ____________________________________________   FINAL CLINICAL IMPRESSION(S) / ED DIAGNOSES  Final diagnoses:  Hyperkalemia  Unwitnessed fall  Contusion of scalp, initial encounter     ED Discharge Orders         Ordered    sodium zirconium cyclosilicate (LOKELMA) 10 g PACK packet  Every other day        04/24/20 1213    Increase activity slowly        04/24/20 1213    Diet - low sodium heart healthy        04/24/20 1213           Note:  This document was prepared using Dragon voice recognition software and may include  unintentional dictation errors.   Naaman Plummer, MD 05/07/20 [phone]

## 2020-05-15 ENCOUNTER — Telehealth: Payer: Self-pay | Admitting: Oncology

## 2020-05-15 NOTE — Telephone Encounter (Signed)
We can fax over the lab orders and set up follow up based on results. I have attempted to call daughter to verify what facility orders need to be sent.

## 2020-05-15 NOTE — Telephone Encounter (Signed)
Patient's daughter [name] called and wanted to r/s missed appts in December 2021-due to being hospitalized. Daughter states that pt has increased weakness and feels she might be overdue for iron. If possible daughter would like to have labs done at the Rehab facility and a virtual visit with Dr. Woodfin Ganja after. She is able to transport her if needed for transfusions but would like to limit moving her as much as possible.   Please advise

## 2020-05-18 ENCOUNTER — Encounter: Payer: Self-pay | Admitting: Internal Medicine

## 2020-05-18 ENCOUNTER — Emergency Department: Payer: Medicare Other

## 2020-05-18 ENCOUNTER — Inpatient Hospital Stay
Admission: EM | Admit: 2020-05-18 | Discharge: 2020-06-04 | DRG: 871 | Disposition: A | Discharge status: Hospice | Payer: Medicare Other | Attending: Internal Medicine | Admitting: Internal Medicine

## 2020-05-18 ENCOUNTER — Other Ambulatory Visit: Payer: Self-pay

## 2020-05-18 DIAGNOSIS — R0902 Hypoxemia: Secondary | ICD-10-CM

## 2020-05-18 DIAGNOSIS — E875 Hyperkalemia: Secondary | ICD-10-CM | POA: Diagnosis present

## 2020-05-18 DIAGNOSIS — J4489 Other specified chronic obstructive pulmonary disease: Secondary | ICD-10-CM | POA: Diagnosis present

## 2020-05-18 DIAGNOSIS — I214 Non-ST elevation (NSTEMI) myocardial infarction: Secondary | ICD-10-CM

## 2020-05-18 DIAGNOSIS — Z8616 Personal history of COVID-19: Secondary | ICD-10-CM

## 2020-05-18 DIAGNOSIS — N179 Acute kidney failure, unspecified: Secondary | ICD-10-CM | POA: Diagnosis present

## 2020-05-18 DIAGNOSIS — R6520 Severe sepsis without septic shock: Secondary | ICD-10-CM | POA: Diagnosis present

## 2020-05-18 DIAGNOSIS — J189 Pneumonia, unspecified organism: Secondary | ICD-10-CM

## 2020-05-18 DIAGNOSIS — Z66 Do not resuscitate: Secondary | ICD-10-CM

## 2020-05-18 DIAGNOSIS — Z6833 Body mass index (BMI) 33.0-33.9, adult: Secondary | ICD-10-CM

## 2020-05-18 DIAGNOSIS — M25511 Pain in right shoulder: Secondary | ICD-10-CM | POA: Diagnosis present

## 2020-05-18 DIAGNOSIS — R296 Repeated falls: Secondary | ICD-10-CM | POA: Diagnosis present

## 2020-05-18 DIAGNOSIS — J449 Chronic obstructive pulmonary disease, unspecified: Secondary | ICD-10-CM | POA: Diagnosis not present

## 2020-05-18 DIAGNOSIS — I4891 Unspecified atrial fibrillation: Secondary | ICD-10-CM

## 2020-05-18 DIAGNOSIS — N189 Chronic kidney disease, unspecified: Secondary | ICD-10-CM | POA: Diagnosis not present

## 2020-05-18 DIAGNOSIS — R52 Pain, unspecified: Secondary | ICD-10-CM

## 2020-05-18 DIAGNOSIS — R778 Other specified abnormalities of plasma proteins: Secondary | ICD-10-CM | POA: Diagnosis present

## 2020-05-18 DIAGNOSIS — E662 Morbid (severe) obesity with alveolar hypoventilation: Secondary | ICD-10-CM | POA: Diagnosis present

## 2020-05-18 DIAGNOSIS — J9601 Acute respiratory failure with hypoxia: Secondary | ICD-10-CM

## 2020-05-18 DIAGNOSIS — D638 Anemia in other chronic diseases classified elsewhere: Secondary | ICD-10-CM | POA: Diagnosis not present

## 2020-05-18 DIAGNOSIS — R079 Chest pain, unspecified: Secondary | ICD-10-CM | POA: Diagnosis not present

## 2020-05-18 DIAGNOSIS — I130 Hypertensive heart and chronic kidney disease with heart failure and stage 1 through stage 4 chronic kidney disease, or unspecified chronic kidney disease: Secondary | ICD-10-CM | POA: Diagnosis present

## 2020-05-18 DIAGNOSIS — E785 Hyperlipidemia, unspecified: Secondary | ICD-10-CM | POA: Diagnosis not present

## 2020-05-18 DIAGNOSIS — Y95 Nosocomial condition: Secondary | ICD-10-CM | POA: Diagnosis present

## 2020-05-18 DIAGNOSIS — D509 Iron deficiency anemia, unspecified: Secondary | ICD-10-CM | POA: Diagnosis present

## 2020-05-18 DIAGNOSIS — E1122 Type 2 diabetes mellitus with diabetic chronic kidney disease: Secondary | ICD-10-CM | POA: Diagnosis present

## 2020-05-18 DIAGNOSIS — Z889 Allergy status to unspecified drugs, medicaments and biological substances status: Secondary | ICD-10-CM

## 2020-05-18 DIAGNOSIS — J188 Other pneumonia, unspecified organism: Secondary | ICD-10-CM

## 2020-05-18 DIAGNOSIS — Z882 Allergy status to sulfonamides status: Secondary | ICD-10-CM

## 2020-05-18 DIAGNOSIS — Z7189 Other specified counseling: Secondary | ICD-10-CM | POA: Diagnosis not present

## 2020-05-18 DIAGNOSIS — Z803 Family history of malignant neoplasm of breast: Secondary | ICD-10-CM

## 2020-05-18 DIAGNOSIS — I472 Ventricular tachycardia: Secondary | ICD-10-CM | POA: Diagnosis not present

## 2020-05-18 DIAGNOSIS — R5381 Other malaise: Secondary | ICD-10-CM

## 2020-05-18 DIAGNOSIS — E8809 Other disorders of plasma-protein metabolism, not elsewhere classified: Secondary | ICD-10-CM | POA: Diagnosis present

## 2020-05-18 DIAGNOSIS — J9811 Atelectasis: Secondary | ICD-10-CM

## 2020-05-18 DIAGNOSIS — M25572 Pain in left ankle and joints of left foot: Secondary | ICD-10-CM | POA: Diagnosis present

## 2020-05-18 DIAGNOSIS — Z7951 Long term (current) use of inhaled steroids: Secondary | ICD-10-CM

## 2020-05-18 DIAGNOSIS — M25571 Pain in right ankle and joints of right foot: Secondary | ICD-10-CM | POA: Diagnosis present

## 2020-05-18 DIAGNOSIS — J440 Chronic obstructive pulmonary disease with acute lower respiratory infection: Secondary | ICD-10-CM | POA: Diagnosis present

## 2020-05-18 DIAGNOSIS — R4701 Aphasia: Secondary | ICD-10-CM | POA: Diagnosis not present

## 2020-05-18 DIAGNOSIS — R609 Edema, unspecified: Secondary | ICD-10-CM | POA: Diagnosis not present

## 2020-05-18 DIAGNOSIS — A419 Sepsis, unspecified organism: Secondary | ICD-10-CM | POA: Diagnosis not present

## 2020-05-18 DIAGNOSIS — I480 Paroxysmal atrial fibrillation: Secondary | ICD-10-CM

## 2020-05-18 DIAGNOSIS — R29810 Facial weakness: Secondary | ICD-10-CM

## 2020-05-18 DIAGNOSIS — Z9889 Other specified postprocedural states: Secondary | ICD-10-CM

## 2020-05-18 DIAGNOSIS — Z955 Presence of coronary angioplasty implant and graft: Secondary | ICD-10-CM

## 2020-05-18 DIAGNOSIS — M170 Bilateral primary osteoarthritis of knee: Secondary | ICD-10-CM | POA: Diagnosis present

## 2020-05-18 DIAGNOSIS — I440 Atrioventricular block, first degree: Secondary | ICD-10-CM | POA: Diagnosis present

## 2020-05-18 DIAGNOSIS — E039 Hypothyroidism, unspecified: Secondary | ICD-10-CM | POA: Diagnosis present

## 2020-05-18 DIAGNOSIS — R54 Age-related physical debility: Secondary | ICD-10-CM | POA: Diagnosis present

## 2020-05-18 DIAGNOSIS — I5032 Chronic diastolic (congestive) heart failure: Secondary | ICD-10-CM | POA: Diagnosis present

## 2020-05-18 DIAGNOSIS — R627 Adult failure to thrive: Secondary | ICD-10-CM | POA: Diagnosis present

## 2020-05-18 DIAGNOSIS — N184 Chronic kidney disease, stage 4 (severe): Secondary | ICD-10-CM | POA: Diagnosis present

## 2020-05-18 DIAGNOSIS — K219 Gastro-esophageal reflux disease without esophagitis: Secondary | ICD-10-CM | POA: Diagnosis present

## 2020-05-18 DIAGNOSIS — I10 Essential (primary) hypertension: Secondary | ICD-10-CM | POA: Diagnosis present

## 2020-05-18 DIAGNOSIS — I2510 Atherosclerotic heart disease of native coronary artery without angina pectoris: Secondary | ICD-10-CM | POA: Diagnosis present

## 2020-05-18 DIAGNOSIS — G9341 Metabolic encephalopathy: Secondary | ICD-10-CM

## 2020-05-18 DIAGNOSIS — R7989 Other specified abnormal findings of blood chemistry: Secondary | ICD-10-CM | POA: Diagnosis present

## 2020-05-18 DIAGNOSIS — Z885 Allergy status to narcotic agent status: Secondary | ICD-10-CM

## 2020-05-18 DIAGNOSIS — I471 Supraventricular tachycardia: Secondary | ICD-10-CM | POA: Diagnosis not present

## 2020-05-18 DIAGNOSIS — M109 Gout, unspecified: Secondary | ICD-10-CM

## 2020-05-18 DIAGNOSIS — Z90710 Acquired absence of both cervix and uterus: Secondary | ICD-10-CM

## 2020-05-18 DIAGNOSIS — I25118 Atherosclerotic heart disease of native coronary artery with other forms of angina pectoris: Secondary | ICD-10-CM | POA: Diagnosis not present

## 2020-05-18 DIAGNOSIS — R001 Bradycardia, unspecified: Secondary | ICD-10-CM | POA: Diagnosis not present

## 2020-05-18 DIAGNOSIS — R109 Unspecified abdominal pain: Secondary | ICD-10-CM | POA: Diagnosis not present

## 2020-05-18 DIAGNOSIS — I4819 Other persistent atrial fibrillation: Secondary | ICD-10-CM

## 2020-05-18 DIAGNOSIS — R569 Unspecified convulsions: Secondary | ICD-10-CM | POA: Diagnosis not present

## 2020-05-18 DIAGNOSIS — J918 Pleural effusion in other conditions classified elsewhere: Secondary | ICD-10-CM | POA: Diagnosis not present

## 2020-05-18 DIAGNOSIS — E876 Hypokalemia: Secondary | ICD-10-CM | POA: Diagnosis not present

## 2020-05-18 DIAGNOSIS — Z515 Encounter for palliative care: Secondary | ICD-10-CM | POA: Diagnosis not present

## 2020-05-18 DIAGNOSIS — G8929 Other chronic pain: Secondary | ICD-10-CM | POA: Diagnosis present

## 2020-05-18 DIAGNOSIS — R197 Diarrhea, unspecified: Secondary | ICD-10-CM | POA: Diagnosis present

## 2020-05-18 DIAGNOSIS — Z79890 Hormone replacement therapy (postmenopausal): Secondary | ICD-10-CM

## 2020-05-18 DIAGNOSIS — M79671 Pain in right foot: Secondary | ICD-10-CM | POA: Diagnosis present

## 2020-05-18 DIAGNOSIS — J90 Pleural effusion, not elsewhere classified: Secondary | ICD-10-CM | POA: Diagnosis not present

## 2020-05-18 DIAGNOSIS — I452 Bifascicular block: Secondary | ICD-10-CM | POA: Diagnosis present

## 2020-05-18 DIAGNOSIS — Z96653 Presence of artificial knee joint, bilateral: Secondary | ICD-10-CM | POA: Diagnosis present

## 2020-05-18 DIAGNOSIS — M79672 Pain in left foot: Secondary | ICD-10-CM | POA: Diagnosis present

## 2020-05-18 DIAGNOSIS — Z7902 Long term (current) use of antithrombotics/antiplatelets: Secondary | ICD-10-CM

## 2020-05-18 DIAGNOSIS — I252 Old myocardial infarction: Secondary | ICD-10-CM

## 2020-05-18 DIAGNOSIS — R531 Weakness: Secondary | ICD-10-CM

## 2020-05-18 DIAGNOSIS — R4781 Slurred speech: Secondary | ICD-10-CM | POA: Diagnosis not present

## 2020-05-18 DIAGNOSIS — E1142 Type 2 diabetes mellitus with diabetic polyneuropathy: Secondary | ICD-10-CM | POA: Diagnosis present

## 2020-05-18 DIAGNOSIS — G40909 Epilepsy, unspecified, not intractable, without status epilepticus: Secondary | ICD-10-CM | POA: Diagnosis present

## 2020-05-18 DIAGNOSIS — Z79899 Other long term (current) drug therapy: Secondary | ICD-10-CM

## 2020-05-18 LAB — URINALYSIS, COMPLETE (UACMP) WITH MICROSCOPIC
Bacteria, UA: NONE SEEN
Bilirubin Urine: NEGATIVE
Glucose, UA: NEGATIVE mg/dL
Hgb urine dipstick: NEGATIVE
Ketones, ur: NEGATIVE mg/dL
Leukocytes,Ua: NEGATIVE
Nitrite: NEGATIVE
Protein, ur: 30 mg/dL — AB
Specific Gravity, Urine: 1.023 (ref 1.005–1.030)
pH: 5 (ref 5.0–8.0)

## 2020-05-18 LAB — CBC WITH DIFFERENTIAL/PLATELET
Abs Immature Granulocytes: 0.02 10*3/uL (ref 0.00–0.07)
Basophils Absolute: 0 10*3/uL (ref 0.0–0.1)
Basophils Relative: 0 %
Eosinophils Absolute: 0 10*3/uL (ref 0.0–0.5)
Eosinophils Relative: 0 %
HCT: 33.6 % — ABNORMAL LOW (ref 36.0–46.0)
Hemoglobin: 10.2 g/dL — ABNORMAL LOW (ref 12.0–15.0)
Immature Granulocytes: 0 %
Lymphocytes Relative: 10 %
Lymphs Abs: 0.5 10*3/uL — ABNORMAL LOW (ref 0.7–4.0)
MCH: 29.8 pg (ref 26.0–34.0)
MCHC: 30.4 g/dL (ref 30.0–36.0)
MCV: 98.2 fL (ref 80.0–100.0)
Monocytes Absolute: 0.5 10*3/uL (ref 0.1–1.0)
Monocytes Relative: 10 %
Neutro Abs: 3.7 10*3/uL (ref 1.7–7.7)
Neutrophils Relative %: 80 %
Platelets: 260 10*3/uL (ref 150–400)
RBC: 3.42 MIL/uL — ABNORMAL LOW (ref 3.87–5.11)
RDW: 15.6 % — ABNORMAL HIGH (ref 11.5–15.5)
Smear Review: NORMAL
WBC Morphology: INCREASED
WBC: 4.7 10*3/uL (ref 4.0–10.5)
nRBC: 0 % (ref 0.0–0.2)

## 2020-05-18 LAB — APTT: aPTT: 31 seconds (ref 24–36)

## 2020-05-18 LAB — COMPREHENSIVE METABOLIC PANEL
ALT: 8 U/L (ref 0–44)
AST: 19 U/L (ref 15–41)
Albumin: 3 g/dL — ABNORMAL LOW (ref 3.5–5.0)
Alkaline Phosphatase: 80 U/L (ref 38–126)
Anion gap: 9 (ref 5–15)
BUN: 34 mg/dL — ABNORMAL HIGH (ref 8–23)
CO2: 25 mmol/L (ref 22–32)
Calcium: 7.7 mg/dL — ABNORMAL LOW (ref 8.9–10.3)
Chloride: 108 mmol/L (ref 98–111)
Creatinine, Ser: 1.66 mg/dL — ABNORMAL HIGH (ref 0.44–1.00)
GFR, Estimated: 29 mL/min — ABNORMAL LOW (ref >60)
Glucose, Bld: 119 mg/dL — ABNORMAL HIGH (ref 70–99)
Potassium: 4.6 mmol/L (ref 3.5–5.1)
Sodium: 142 mmol/L (ref 135–145)
Total Bilirubin: 0.9 mg/dL (ref 0.3–1.2)
Total Protein: 5.6 g/dL — ABNORMAL LOW (ref 6.5–8.1)

## 2020-05-18 LAB — LACTIC ACID, PLASMA
Lactic Acid, Venous: 2.9 mmol/L (ref 0.5–1.9)
Lactic Acid, Venous: 1.5 mmol/L (ref 0.5–1.9)

## 2020-05-18 LAB — TROPONIN I (HIGH SENSITIVITY)
Troponin I (High Sensitivity): 638 ng/L (ref <18)
Troponin I (High Sensitivity): 933 ng/L (ref <18)
Troponin I (High Sensitivity): 1163 ng/L (ref <18)
Troponin I (High Sensitivity): 1196 ng/L (ref <18)
Troponin I (High Sensitivity): 814 ng/L (ref <18)

## 2020-05-18 LAB — BRAIN NATRIURETIC PEPTIDE: B Natriuretic Peptide: 748.3 pg/mL — ABNORMAL HIGH (ref 0.0–100.0)

## 2020-05-18 LAB — PROTIME-INR
INR: 1.2 (ref 0.8–1.2)
Prothrombin Time: 14.4 seconds (ref 11.4–15.2)

## 2020-05-18 LAB — HEPARIN LEVEL (UNFRACTIONATED): Heparin Unfractionated: <0.1 IU/mL — ABNORMAL LOW (ref 0.30–0.70)

## 2020-05-18 LAB — MRSA PCR SCREENING: MRSA by PCR: NEGATIVE

## 2020-05-18 LAB — GLUCOSE, CAPILLARY: Glucose-Capillary: 125 mg/dL — ABNORMAL HIGH (ref 70–99)

## 2020-05-18 LAB — PROCALCITONIN: Procalcitonin: 5.26 ng/mL

## 2020-05-18 MED ORDER — LEVOTHYROXINE SODIUM 88 MCG PO TABS
88.0000 ug | ORAL_TABLET | Freq: Every day | ORAL | Status: DC
  Administered 2020-05-19 – 2020-05-21 (×3): 88 ug via ORAL
  Filled 2020-05-18 (×4): qty 1

## 2020-05-18 MED ORDER — ALBUTEROL SULFATE (2.5 MG/3ML) 0.083% IN NEBU
2.5000 mg | INHALATION_SOLUTION | RESPIRATORY_TRACT | Status: DC | PRN
  Filled 2020-05-18: qty 3

## 2020-05-18 MED ORDER — LACTATED RINGERS IV SOLN: INTRAVENOUS | Status: DC

## 2020-05-18 MED ORDER — HEPARIN (PORCINE) 25000 UT/250ML-% IV SOLN
850.0000 [IU]/h | INTRAVENOUS | Status: DC
  Administered 2020-05-18: 850 [IU]/h via INTRAVENOUS
  Filled 2020-05-18 (×2): qty 250

## 2020-05-18 MED ORDER — LEVETIRACETAM 250 MG PO TABS
250.0000 mg | ORAL_TABLET | Freq: Two times a day (BID) | ORAL | Status: DC
Start: 2020-05-18 — End: 2020-05-22
  Administered 2020-05-18 – 2020-05-21 (×8): 250 mg via ORAL
  Filled 2020-05-18 (×10): qty 1

## 2020-05-18 MED ORDER — SODIUM CHLORIDE 0.9 % IV BOLUS
1000.0000 mL | Freq: Once | INTRAVENOUS | Status: AC
  Administered 2020-05-18: 1000 mL via INTRAVENOUS

## 2020-05-18 MED ORDER — MOMETASONE FURO-FORMOTEROL FUM 200-5 MCG/ACT IN AERO
2.0000 | INHALATION_SPRAY | Freq: Two times a day (BID) | RESPIRATORY_TRACT | Status: DC
  Administered 2020-05-18 – 2020-06-02 (×29): 2 via RESPIRATORY_TRACT
  Filled 2020-05-18: qty 8.8

## 2020-05-18 MED ORDER — VANCOMYCIN HCL IN DEXTROSE 1-5 GM/200ML-% IV SOLN
1000.0000 mg | Freq: Once | INTRAVENOUS | Status: AC
  Administered 2020-05-18: 1000 mg via INTRAVENOUS
  Filled 2020-05-18: qty 200

## 2020-05-18 MED ORDER — NITROGLYCERIN 0.4 MG SL SUBL: 0.4000 mg | SUBLINGUAL_TABLET | SUBLINGUAL | Status: DC | PRN

## 2020-05-18 MED ORDER — PREGABALIN 50 MG PO CAPS: 50.0000 mg | ORAL_CAPSULE | ORAL | Status: DC

## 2020-05-18 MED ORDER — LORAZEPAM 2 MG/ML IJ SOLN: 1.0000 mg | INTRAMUSCULAR | Status: DC | PRN

## 2020-05-18 MED ORDER — AMITRIPTYLINE HCL 10 MG PO TABS
10.0000 mg | ORAL_TABLET | Freq: Every day | ORAL | Status: DC
Start: 2020-05-18 — End: 2020-05-22
  Administered 2020-05-18 – 2020-05-21 (×4): 10 mg via ORAL
  Filled 2020-05-18 (×4): qty 1

## 2020-05-18 MED ORDER — LEVETIRACETAM 250 MG PO TABS: 250.0000 mg | ORAL_TABLET | Freq: Two times a day (BID) | ORAL | Status: DC

## 2020-05-18 MED ORDER — SODIUM CHLORIDE 0.9 % IV SOLN
2.0000 g | Freq: Once | INTRAVENOUS | Status: AC
  Administered 2020-05-18: 2 g via INTRAVENOUS
  Filled 2020-05-18: qty 2

## 2020-05-18 MED ORDER — VANCOMYCIN HCL 750 MG/150ML IV SOLN: 750.0000 mg | INTRAVENOUS | Status: DC

## 2020-05-18 MED ORDER — SODIUM ZIRCONIUM CYCLOSILICATE 10 G PO PACK
10.0000 g | PACK | ORAL | Status: DC
  Administered 2020-05-19: 10 g via ORAL
  Filled 2020-05-18: qty 1

## 2020-05-18 MED ORDER — DOCUSATE SODIUM 100 MG PO CAPS
100.0000 mg | ORAL_CAPSULE | Freq: Two times a day (BID) | ORAL | Status: DC | PRN
  Administered 2020-06-01: 100 mg via ORAL
  Filled 2020-05-18: qty 1

## 2020-05-18 MED ORDER — SODIUM CHLORIDE 0.9 % IV SOLN
2.0000 g | INTRAVENOUS | Status: DC
  Administered 2020-05-19 – 2020-05-21 (×3): 2 g via INTRAVENOUS
  Filled 2020-05-18 (×4): qty 2

## 2020-05-18 MED ORDER — PREGABALIN 50 MG PO CAPS
50.0000 mg | ORAL_CAPSULE | Freq: Every day | ORAL | Status: DC
  Administered 2020-05-19 – 2020-05-23 (×5): 50 mg via ORAL
  Filled 2020-05-18 (×6): qty 1

## 2020-05-18 MED ORDER — HEPARIN BOLUS VIA INFUSION
4000.0000 [IU] | Freq: Once | INTRAVENOUS | Status: AC
  Administered 2020-05-18: 4000 [IU] via INTRAVENOUS
  Filled 2020-05-18: qty 4000

## 2020-05-18 MED ORDER — DM-GUAIFENESIN ER 30-600 MG PO TB12: 1.0000 | ORAL_TABLET | Freq: Two times a day (BID) | ORAL | Status: DC | PRN

## 2020-05-18 MED ORDER — PREGABALIN 50 MG PO CAPS
100.0000 mg | ORAL_CAPSULE | Freq: Every day | ORAL | Status: DC
  Administered 2020-05-18 – 2020-05-23 (×6): 100 mg via ORAL
  Filled 2020-05-18 (×6): qty 2

## 2020-05-18 MED ORDER — ONDANSETRON HCL 4 MG/2ML IJ SOLN: 4.0000 mg | Freq: Three times a day (TID) | INTRAMUSCULAR | Status: DC | PRN

## 2020-05-18 MED ORDER — IPRATROPIUM-ALBUTEROL 0.5-2.5 (3) MG/3ML IN SOLN
3.0000 mL | Freq: Three times a day (TID) | RESPIRATORY_TRACT | Status: DC
  Administered 2020-05-18 – 2020-05-20 (×5): 3 mL via RESPIRATORY_TRACT
  Filled 2020-05-18 (×5): qty 3

## 2020-05-18 MED ORDER — POLYETHYLENE GLYCOL 3350 17 G PO PACK: 17.0000 g | PACK | Freq: Every day | ORAL | Status: DC | PRN

## 2020-05-18 MED ORDER — MELATONIN 5 MG PO TABS
5.0000 mg | ORAL_TABLET | Freq: Every evening | ORAL | Status: DC | PRN
  Administered 2020-05-20 – 2020-05-31 (×2): 5 mg via ORAL
  Filled 2020-05-18 (×3): qty 1

## 2020-05-18 MED ORDER — SIMVASTATIN 20 MG PO TABS
40.0000 mg | ORAL_TABLET | Freq: Every day | ORAL | Status: DC
  Administered 2020-05-18 – 2020-05-20 (×3): 40 mg via ORAL
  Filled 2020-05-18 (×3): qty 2

## 2020-05-18 MED ORDER — POLYSACCHARIDE IRON COMPLEX 150 MG PO CAPS
150.0000 mg | ORAL_CAPSULE | Freq: Every day | ORAL | Status: DC
  Administered 2020-05-19 – 2020-06-02 (×13): 150 mg via ORAL
  Filled 2020-05-18 (×16): qty 1

## 2020-05-18 MED ORDER — VANCOMYCIN HCL 750 MG/150ML IV SOLN
750.0000 mg | Freq: Once | INTRAVENOUS | Status: AC
  Administered 2020-05-18: 750 mg via INTRAVENOUS
  Filled 2020-05-18: qty 150

## 2020-05-18 MED ORDER — CLOPIDOGREL BISULFATE 75 MG PO TABS
75.0000 mg | ORAL_TABLET | Freq: Every day | ORAL | Status: DC
  Administered 2020-05-18 – 2020-06-02 (×15): 75 mg via ORAL
  Filled 2020-05-18 (×16): qty 1

## 2020-05-18 MED ORDER — MONTELUKAST SODIUM 10 MG PO TABS
10.0000 mg | ORAL_TABLET | Freq: Every day | ORAL | Status: DC
  Administered 2020-05-18 – 2020-05-31 (×14): 10 mg via ORAL
  Filled 2020-05-18 (×14): qty 1

## 2020-05-18 MED ORDER — ACETAMINOPHEN 325 MG PO TABS
650.0000 mg | ORAL_TABLET | Freq: Four times a day (QID) | ORAL | Status: DC | PRN
  Administered 2020-05-19 – 2020-05-20 (×3): 650 mg via ORAL
  Filled 2020-05-18 (×3): qty 2

## 2020-05-18 MED ORDER — TRAMADOL HCL 50 MG PO TABS
50.0000 mg | ORAL_TABLET | Freq: Two times a day (BID) | ORAL | Status: DC | PRN
  Administered 2020-05-19 – 2020-05-21 (×3): 50 mg via ORAL
  Filled 2020-05-18 (×3): qty 1

## 2020-05-18 MED ORDER — METRONIDAZOLE IN NACL 5-0.79 MG/ML-% IV SOLN
500.0000 mg | Freq: Once | INTRAVENOUS | Status: AC
  Administered 2020-05-18: 500 mg via INTRAVENOUS
  Filled 2020-05-18 (×2): qty 100

## 2020-05-18 MED ORDER — PANTOPRAZOLE SODIUM 20 MG PO TBEC
20.0000 mg | DELAYED_RELEASE_TABLET | Freq: Every day | ORAL | Status: DC
  Administered 2020-05-18 – 2020-05-21 (×4): 20 mg via ORAL
  Filled 2020-05-18 (×5): qty 1

## 2020-05-18 MED ORDER — IPRATROPIUM-ALBUTEROL 0.5-2.5 (3) MG/3ML IN SOLN
3.0000 mL | RESPIRATORY_TRACT | Status: DC
  Administered 2020-05-18: 3 mL via RESPIRATORY_TRACT
  Filled 2020-05-18: qty 3

## 2020-05-18 MED ORDER — SODIUM CHLORIDE 0.9% FLUSH
10.0000 mL | INTRAVENOUS | Status: DC | PRN
  Administered 2020-06-02: 10 mL

## 2020-05-18 MED ORDER — SODIUM CHLORIDE 0.9% FLUSH
10.0000 mL | Freq: Two times a day (BID) | INTRAVENOUS | Status: DC
  Administered 2020-05-18 – 2020-06-02 (×30): 10 mL

## 2020-05-18 NOTE — H&P (Signed)
History and Physical    Amy [name] MHD:[id number] DOB: September 30, 1927 DOA: 05/18/2020  Referring MD/NP/PA:   PCP: Alvester Morin, MD   Patient coming from:  The patient is coming from SNF.  At baseline, pt is dependent for most of ADL.        Chief Complaint: SOB, abdominal pain  HPI: Amy [name] is a 87 y.o. female with medical history significant of HTN, HLD, DM, COPD, CAD with DES, hypothyroidism, A fib not on AC, seizure, CKD-IV, anemia, dCHF, GIB, covid19 infection 03/06/20, presents with SOB and abdominal pain.  Patient states that she started having abdominal pain since yesterday, which is located in the left side of the abdomen, associated with nausea and diarrhea.  She has had 3-4 times of loose stool bowel movement.  She also had fever of 101 in facility, currently body temperature is 97.5 in ED. Patient has cough and shortness of breath which has been progressively worsening.  She was found to have oxygen desaturation to 85% on room air, which improved to 94% on 5 L oxygen.  Denies chest pain.  No rectal bleeding or dark stool.    ED Course: pt was found to have WBC 4.7, lactic acid 2.9, 1.5, INR 1.1, PTT 31, troponin level 638, 933, positive Covid PCR, renal function close to baseline, temperature 97.5, softer blood pressure 93/39, heart rate 98, RR 28.  Chest x-ray showed multifocal infiltration mainly in the left side, vascular congestion and cardiomegaly.  CT of the chest/abdomen/pelvis showed left lower lobe infiltration,  no acute issues intra-abdominally. Pt is admitted to progressive bed as inpatient.  Dr. Rockey Situ of card is consulted.  Review of Systems:   General: had fevers, no chills, no body weight gain, has poor appetite, has fatigue HEENT: no blurry vision, hearing changes or sore throat Respiratory: has dyspnea, coughing, no wheezing CV: no chest pain, no palpitations GI: has nausea, abdominal pain, diarrhea, no vomiting, constipation GU: no  dysuria, burning on urination, increased urinary frequency, hematuria  Ext: no leg edema Neuro: no unilateral weakness, numbness, or tingling, no vision change or hearing loss Skin: no rash, no skin tear. MSK: No muscle spasm, no deformity, no limitation of range of movement in spin Heme: No easy bruising.  Travel history: No recent long distant travel.  Allergy:  Allergies  Allergen Reactions  . Atenolol Other (See Comments)    Other reaction(s): Other (See Comments) Decreased heart rate Decreased heart rate  . Codeine Shortness Of Breath    Rash, difficulty breathing, nausea.  . Losartan Other (See Comments)    Hyperkalemia  . Morphine And Related Shortness Of Breath    Rash, difficulty breathing, nausea.   . Fentanyl Other (See Comments)    hallucinations  . Nsaids Other (See Comments)    Other reaction(s): Unknown  . Rofecoxib Other (See Comments)    Other reaction(s): Unknown  . Sucralfate Other (See Comments)    Throat tightness  . Sulfa Antibiotics Other (See Comments)    Other reaction(s): Unknown  . Tramadol Other (See Comments)    lethargy    Past Medical History:  Diagnosis Date  . Bladder incontinence   . CAD (coronary artery disease)    a. 05/2009 Cath: LAD 90p (Xience 2.75 x 12 mm DES), 21m, D1 40, LCx 40p/m, RCA 30/40/30p/m, RCA 30/25d;  b.  12/2011 Lexiscan MV: no ischemia, breast attenuation artifact, normal EF-->Low risk; c. 10/2016 MV: fixed apical defect, most likely apical thinning and attenuation, No  ischemia, EF 60%.  . Cancer ([place])    ovarian  . Carotid arterial disease w/ R Carotid Bruit (HCC)    a. 08/2016 Carotid U/S: 40-59% bilat ICA stenosis - f/u 1 yr.  . Chronic diastolic (congestive) heart failure ([place])    a. Echo 09/2015: EF 55-60% w/ Grade 1 DD, sev Ca2+ MV annulus, mildly dil LA; b. 09/2016 Echo: EF 65-70%, Gr2 DD, mildly dil LA/RA, nl RV fxn.  . Chronic Dyspnea on exertion   . CKD (chronic kidney disease) stage 3, GFR 30-59 ml/min (HCC)    . Degenerative arthritis of knee    bilateral knees  . Diabetes mellitus    Type II  . GIB (gastrointestinal bleeding)    a. 08/2016 Admission w/ presyncope/anemia/melena-->req Transfusion-->endo ok, colonoscopy w/ polyps but no source of bleeding (most likely diverticular).  . Hiatal hernia   . Hypertension   . Iron deficiency   . Menopausal symptoms   . Morbid obesity ([place])   . Renal insufficiency   . Thyroid disease    hypothyroidism    Past Surgical History:  Procedure Laterality Date  . COLONOSCOPY  2015  . COLONOSCOPY WITH PROPOFOL N/A 09/25/2016   Procedure: COLONOSCOPY WITH PROPOFOL;  Surgeon: [name], MD;  Location[facility] ENDOSCOPY;  Service: Gastroenterology;  Laterality: N/A;  . COLONOSCOPY WITH PROPOFOL N/A 09/26/2016   Procedure: COLONOSCOPY WITH PROPOFOL;  Surgeon: [name], MD;  Location: [facility] ENDOSCOPY;  Service: Gastroenterology;  Laterality: N/A;  . ESOPHAGOGASTRODUODENOSCOPY (EGD) WITH PROPOFOL N/A 09/25/2016   Procedure: ESOPHAGOGASTRODUODENOSCOPY (EGD) WITH PROPOFOL;  Surgeon: [name], MD;  Location: [facility] ENDOSCOPY;  Service: Gastroenterology;  Laterality: N/A;  . RENAL ANGIOGRAPHY N/A 02/07/2019   Procedure: RENAL ANGIOGRAPHY;  Surgeon: [name], MD;  Location: [facility] CV LAB;  Service: Cardiovascular;  Laterality: N/A;  . REPLACEMENT TOTAL KNEE BILATERAL    . TOTAL VAGINAL HYSTERECTOMY     ovarian mass, not cancerous  . UPPER GI ENDOSCOPY  2015    Social History:  reports that she has never smoked. She has never used smokeless tobacco. She reports that she does not drink alcohol and does not use drugs.  Family History:  Family History  Problem Relation Age of Onset  . Breast cancer Mother 51     Prior to Admission medications   Medication Sig Start Date End Date Taking? Authorizing Provider  acetaminophen (TYLENOL) 500 MG tablet Take 1,000 mg by mouth 3 (three) times daily as needed for mild pain or moderate pain.   Yes [provider]  amitriptyline (ELAVIL) 10 MG tablet Take 10 mg by mouth at bedtime. 12/04/19  Yes [provider]  clopidogrel (PLAVIX) 75 MG tablet Take 1 tablet (75 mg total) by mouth daily. 09/27/16  Yes Dustin Flock, MD  dextromethorphan-guaiFENesin [facility] DM) 30-600 MG 12hr tablet Take 1 tablet by mouth every 12 (twelve) hours.   Yes [provider]  docusate sodium (COLACE) 100 MG capsule Take 1 capsule (100 mg total) by mouth 2 (two) times daily. 03/08/20  Yes Fritzi Mandes, MD  doxazosin (CARDURA) 2 MG tablet Take 2 mg by mouth 2 (two) times daily. 04/03/20  Yes [provider]  iron polysaccharides (NIFEREX) 150 MG capsule Take 1 capsule (150 mg total) by mouth daily. 02/15/20  Yes Enzo Bi, MD  levETIRAcetam (KEPPRA) 250 MG tablet Take 250 mg by mouth 2 (two) times daily.   Yes [provider]  levothyroxine (SYNTHROID) 88 MCG tablet Take 88 mcg by mouth  daily before breakfast. 06/20/19  Yes [provider]  lisinopril (ZESTRIL) 2.5 MG tablet Take 2.5 mg by mouth daily. 01/10/20 01/09/21 Yes [provider]  melatonin 5 MG TABS Take 1 tablet (5 mg total) by mouth at bedtime as needed (sleep). 02/14/20  Yes Enzo Bi, MD  montelukast (SINGULAIR) 10 MG tablet Take 10 mg by mouth at bedtime.  03/24/19  Yes [provider]  pantoprazole (PROTONIX) 20 MG tablet Take 20 mg by mouth daily.    Yes [provider]  polyethylene glycol (MIRALAX / GLYCOLAX) 17 g packet Take 17 g by mouth daily. 03/08/20  Yes Fritzi Mandes, MD  pregabalin (LYRICA) 50 MG capsule Take 50-100 mg by mouth See admin instructions. Take 1 capsule (50mg ) by mouth every morning and take 2 capsules (100mg ) by mouth every night at bedtime   Yes [provider]  simvastatin (ZOCOR) 40 MG tablet Take 1 tablet (40 mg total) by mouth at bedtime. 03/22/18  Yes Gollan, Kathlene November, MD  sodium zirconium cyclosilicate (LOKELMA) 10 g PACK packet Take 10 g by mouth  every other day. 04/24/20  Yes Sreenath, Trula Slade, MD  SYMBICORT 160-4.5 MCG/ACT inhaler Inhale 2 puffs into the lungs 2 (two) times daily.  11/15/14  Yes [provider]  torsemide (DEMADEX) 20 MG tablet Take 20 mg by mouth daily at 12 noon.   Yes [provider]  traMADol (ULTRAM) 50 MG tablet Take 50 mg by mouth every 6 (six) hours as needed (pain). 04/19/20  Yes [provider]  albuterol (VENTOLIN HFA) 108 (90 Base) MCG/ACT inhaler Inhale 2 puffs into the lungs every 6 (six) hours as needed for wheezing or shortness of breath.    [provider]  cyanocobalamin (,VITAMIN B-12,) 1000 MCG/ML injection Inject 1,000 mcg into the muscle every 30 (thirty) days. On the 1st of the month 05/01/20   [provider]  nitroGLYCERIN (NITROSTAT) 0.4 MG SL tablet Place 1 tablet (0.4 mg total) under the tongue every 5 (five) minutes x 3 doses as needed for chest pain. 08/23/19   Enzo Bi, MD    Physical Exam: Vitals:   05/18/20 1148 05/18/20 1330 05/18/20 1537 05/18/20 1603  BP: (!) 117/41 (!) 140/44 (!) 159/45   Pulse: 75 61 73   Resp: (!) 21 19 16    Temp: (!) 97.5 F (36.4 C)  97.7 F (36.5 C)   TempSrc: Oral  Oral   SpO2: 94% 100% 98% 96%  Weight:      Height:       General: Not in acute distress HEENT:       Eyes: PERRL, EOMI, no scleral icterus.       ENT: No discharge from the ears and nose, no pharynx injection, no tonsillar enlargement.        Neck: No JVD, no mass felt. Heme: No neck lymph node enlargement. Cardiac: S1/S2, RRR, No murmurs, No gallops or rubs. Respiratory: Has fine crackles bilaterally GI: Soft, nondistended, has tenderness in left  abdomen, no rebound pain, no organomegaly, BS present. GU: No hematuria Ext: No pitting leg edema bilaterally. 1+DP/PT pulse bilaterally. Musculoskeletal: No joint deformities, No joint redness or warmth, no limitation of ROM in spin. Skin: No rashes.  Neuro: Alert, oriented X3, cranial nerves  II-XII grossly intact, moves all extremities. Psych: Patient is not psychotic, no suicidal or hemocidal ideation.  Labs on Admission: I have personally reviewed following labs and imaging studies  CBC: Recent Labs  Lab 05/18/20 0900  WBC 4.7  NEUTROABS 3.7  HGB 10.2*  HCT 33.6*  MCV 98.2  PLT 176   Basic Metabolic Panel: Recent Labs  Lab 05/18/20 0900  NA 142  K 4.6  CL 108  CO2 25  GLUCOSE 119*  BUN 34*  CREATININE 1.66*  CALCIUM 7.7*   GFR: Estimated Creatinine Clearance: 22.5 mL/min (A) (by C-G formula based on SCr of 1.66 mg/dL (H)). Liver Function Tests: Recent Labs  Lab 05/18/20 0900  AST 19  ALT 8  ALKPHOS 80  BILITOT 0.9  PROT 5.6*  ALBUMIN 3.0*   No results for input(s): LIPASE, AMYLASE in the last 168 hours. No results for input(s): AMMONIA in the last 168 hours. Coagulation Profile: Recent Labs  Lab 05/18/20 0900  INR 1.2   Cardiac Enzymes: No results for input(s): CKTOTAL, CKMB, CKMBINDEX, TROPONINI in the last 168 hours. BNP (last 3 results) No results for input(s): PROBNP in the last 8760 hours. HbA1C: No results for input(s): HGBA1C in the last 72 hours. CBG: No results for input(s): GLUCAP in the last 168 hours. Lipid Profile: No results for input(s): CHOL, HDL, LDLCALC, TRIG, CHOLHDL, LDLDIRECT in the last 72 hours. Thyroid Function Tests: No results for input(s): TSH, T4TOTAL, FREET4, T3FREE, THYROIDAB in the last 72 hours. Anemia Panel: No results for input(s): VITAMINB12, FOLATE, FERRITIN, TIBC, IRON, RETICCTPCT in the last 72 hours. Urine analysis:    Component Value Date/Time   COLORURINE YELLOW (A) 05/18/2020 1145   APPEARANCEUR HAZY (A) 05/18/2020 1145   APPEARANCEUR Cloudy (A) 03/15/2019 0909   LABSPEC 1.023 05/18/2020 1145   LABSPEC 1.025 10/11/2013 1637   PHURINE 5.0 05/18/2020 1145   GLUCOSEU NEGATIVE 05/18/2020 1145   GLUCOSEU Negative 10/11/2013 1637   HGBUR NEGATIVE 05/18/2020 1145   BILIRUBINUR NEGATIVE  05/18/2020 1145   BILIRUBINUR Negative 03/15/2019 0909   BILIRUBINUR Negative 10/11/2013 1637   KETONESUR NEGATIVE 05/18/2020 1145   PROTEINUR 30 (A) 05/18/2020 1145   NITRITE NEGATIVE 05/18/2020 1145   LEUKOCYTESUR NEGATIVE 05/18/2020 1145   LEUKOCYTESUR Negative 10/11/2013 1637   Sepsis Labs: @LABRCNTIP (procalcitonin:4,lacticidven:4) ) Recent Results (from the past 240 hour(s))  Resp Panel by RT-PCR (Flu A&B, Covid) Nasopharyngeal Swab     Status: Abnormal   Collection Time: 05/18/20 10:04 AM   Specimen: Nasopharyngeal Swab; Nasopharyngeal(NP) swabs in vial transport medium  Result Value Ref Range Status   SARS Coronavirus 2 by RT PCR POSITIVE (A) NEGATIVE Final    Comment: RESULT CALLED TO, READ BACK BY AND VERIFIED WITH:  TAYLOR LEWIS AT 1115 04/20/20 SDR (NOTE) SARS-CoV-2 target nucleic acids are DETECTED.  The SARS-CoV-2 RNA is generally detectable in upper respiratory specimens during the acute phase of infection. Positive results are indicative of the presence of the identified virus, but do not rule out bacterial infection or co-infection with other pathogens not detected by the test. Clinical correlation with patient history and other diagnostic information is necessary to determine patient infection status. The expected result is Negative.  Fact Sheet for Patients: EntrepreneurPulse.com.au  Fact Sheet for Healthcare Providers: IncredibleEmployment.be  This test is not yet approved or cleared by the [place] FDA and  has been authorized for detection and/or diagnosis of SARS-CoV-2 by FDA under an Emergency Use Authorization (EUA).  This EUA will remain in effect (meaning this test can be  used) for the duration of  the COVID-19 declaration under Section 564(b)(1) of the Act, 21 U.S.C. section 360bbb-3(b)(1), unless the authorization is terminated or revoked sooner.     Influenza  A by PCR NEGATIVE NEGATIVE Final    Influenza B by PCR NEGATIVE NEGATIVE Final    Comment: (NOTE) The Xpert Xpress SARS-CoV-2/FLU/RSV plus assay is intended as an aid in the diagnosis of influenza from Nasopharyngeal swab specimens and should not be used as a sole basis for treatment. Nasal washings and aspirates are unacceptable for Xpert Xpress SARS-CoV-2/FLU/RSV testing.  Fact Sheet for Patients: EntrepreneurPulse.com.au  Fact Sheet for Healthcare Providers: IncredibleEmployment.be  This test is not yet approved or cleared by the [place] FDA and has been authorized for detection and/or diagnosis of SARS-CoV-2 by FDA under an Emergency Use Authorization (EUA). This EUA will remain in effect (meaning this test can be used) for the duration of the COVID-19 declaration under Section 564(b)(1) of the Act, 21 U.S.C. section 360bbb-3(b)(1), unless the authorization is terminated or revoked.  Performed at [facility], [address] [place], [place] [address]   MRSA PCR Screening     Status: None   Collection Time: 05/18/20  3:48 PM   Specimen: Nasal Mucosa; Nasopharyngeal  Result Value Ref Range Status   MRSA by PCR NEGATIVE NEGATIVE Final    Comment:        The GeneXpert MRSA Assay (FDA approved for NASAL specimens only), is one component of a comprehensive MRSA colonization surveillance program. It is not intended to diagnose MRSA infection nor to guide or monitor treatment for MRSA infections. Performed at [facility], [address], [place], [place] [address]      Radiological Exams on Admission: DG Chest [facility] 1 View  Result Date: 05/18/2020 CLINICAL DATA:  Shortness of breath and fever EXAM: PORTABLE CHEST 1 VIEW COMPARISON:  March 06, 2020 FINDINGS: There is extensive airspace opacity throughout most of the left lung. The right lung is clear. There is cardiomegaly with mild pulmonary venous hypertension. No adenopathy. There is aortic  atherosclerosis. There is calcification in each carotid artery. No bone lesions. IMPRESSION: Widespread airspace opacity throughout much of the left lung consistent with multifocal pneumonia. Right lung clear. There is cardiomegaly with a degree of pulmonary vascular congestion. There is calcification in each carotid artery. Aortic Atherosclerosis (ICD10-I70.0). Electronically Signed   By: Lowella Grip III M.D.   On: 05/18/2020 09:08   CT CHEST ABDOMEN PELVIS WO CONTRAST  Result Date: 05/18/2020 CLINICAL DATA:  Chest pain or shortness of breath. Pleural effusion or pleurisy suspected EXAM: CT CHEST, ABDOMEN AND PELVIS WITHOUT CONTRAST TECHNIQUE: Multidetector CT imaging of the chest, abdomen and pelvis was performed following the standard protocol without IV contrast. COMPARISON:  01/13/2017 FINDINGS: CT CHEST FINDINGS Cardiovascular: Normal heart size. No pericardial effusion. Bulky atherosclerotic calcification of the aorta and great vessels. Coronary atherosclerosis. Mediastinum/Nodes: No adenopathy or mass. Moderate sliding hiatal hernia. Lungs/Pleura: Dense consolidation throughout the left lung with poor visualization of central airways. Small left pleural effusion. Musculoskeletal: Nonacute right humeral neck fracture with marked anterior displacement that is similar to initial diagnosis February 2022 by radiography. Extensive spondylosis and thoracic spine ankylosis. Multilevel degenerative facet spurring especially at the open T6-7 level where there is biforaminal impingement. CT ABDOMEN PELVIS FINDINGS Hepatobiliary: No focal liver abnormality.No evidence of biliary obstruction or stone. Pancreas: Unremarkable. Spleen: Unremarkable. Adrenals/Urinary Tract: Negative adrenals. No hydronephrosis or stone. Left upper pole renal lesion which is a cyst by ultrasound in 2020. Peripherally calcified structure in the left upper pole which was also described on prior. Unremarkable bladder. Stomach/Bowel: No  obstruction. No visible bowel inflammation. Numerous distal colonic diverticula. Distal  colonic stool with moderate distension of the rectum. No superimposed rectal wall thickening. Vascular/Lymphatic: No acute vascular abnormality. No mass or adenopathy. Reproductive:Hysterectomy Other: No ascites or pneumoperitoneum. Musculoskeletal: No acute abnormalities. Severe lumbar spine degeneration which is generalized. Generalized osteopenia. Hip osteoarthritis that is particularly advanced on the right. IMPRESSION: 1. Extensive left-sided pneumonia opacifying most of the left lung. Small left parapneumonic effusion. 2. No acute intra-abdominal finding. 3. Numerous chronic/known incidental findings which are described above. Electronically Signed   By: Monte Fantasia M.D.   On: 05/18/2020 11:21     EKG: I have personally reviewed.  Sinus rhythm, QTC 494, bifascicular block, early R wave progression  Assessment/Plan Principal Problem:   HCAP (healthcare-associated pneumonia) Active Problems:   Acquired hypothyroidism   HLD (hyperlipidemia)   Essential hypertension   Iron deficiency anemia   Chronic diastolic CHF (congestive heart failure) (HCC)   COPD with chronic bronchitis (HCC)   Severe sepsis (HCC)   CKD (chronic kidney disease) stage 4, GFR 15-29 ml/min (HCC)   HTN (hypertension)   GERD (gastroesophageal reflux disease)   CAD (coronary artery disease)   Atrial fibrillation (HCC)   Seizure (HCC)   Elevated troponin    Severe sepsis due to HCAP (healthcare-associated pneumonia): Patient meets criteria for severe sepsis with tachycardia with heart rate in 98, tachypnea with RR 28, elevated lactic acid 2.9, 1.5.  Patient had a Covid infection 03/06/2020, today Covid PCR still positive.  She is beyond the window of isolation.  Will not treat Covid anymore.  Procalcitonin 5.26  -Admitted to progressive unit as inpatient - IV Vancomycin and cefepime (patient received 1 dose of Flagyl in ED) -  Mucinex for cough  - Bronchodilators - Urine legionella and S. pneumococcal antigen - Follow up blood culture x2, sputum culture  - will get Procalcitonin and trend lactic acid level per sepsis protocol - IVF: 1.5 L of NS bolus  Acquired hypothyroidism  -Synthroid  HLD (hyperlipidemia) -Leukoma  Essential hypertension -Hold all blood pressure medications due to softer blood pressure  Iron deficiency anemia: Hemoglobin stable 10.2 -Continue iron supplement  Chronic diastolic CHF (congestive heart failure) ([place]): 2D echo on 02/07/2020 showed EF of 55 to 60% with grade 2 diastolic dysfunction.  BNP elevated 748.  No leg edema, does not seem to have acute CHF exacerbation, but patient is at high risk of developing CHF exacerbation. -Hold torsemide due to softer blood pressure and sepsis  COPD with chronic bronchitis (HCC) -Bronchodilators -Singulair  CKD (chronic kidney disease) stage 4, GFR 15-29 ml/min (HCC): Stable -Follow-up by BMP -Patient is on Lokelma every other day  GERD (gastroesophageal reflux disease) -Protonix  CAD (coronary artery disease) and elevated troponin: Troponin level 638, 933.  No chest pain.  Possibly due to demand ischemia versus non-STEMI.  Dr. Rockey Situ for cardiology is consulted -Started IV heparin -Patient is on Plavix, Zocor -As needed nitroglycerin -Trend troponin -Check A1c, FLP  Atrial fibrillation ([place]): not on AC. Hr 98, 75 -Cardiac monitoring  Seizure -Seizure precaution -When necessary Ativan for seizure -Continue Home medications: Keppra  Abdominal pain and diarrhea: -Check C. difficile PCR and GI pathogen panel -As needed tramadol 50 mg bid prn and tylenol (patient is allergic to many medications, difficult to choose pain medication), need to be careful since patient has history of seizure.        DVT ppx: on IV Heparin  Code Status: DNR per her daughter Family Communication:  Yes, patient's daughter by phone Disposition  Plan:  Anticipate  discharge back to previous environment Consults called: Dr. Rockey Situ for cardiology Admission status and Level of care: Progressive Cardiac:    as inpt     Status is: Inpatient  Remains inpatient appropriate because:Inpatient level of care appropriate due to severity of illness   Dispo: The patient is from: SNF              Anticipated d/c is to: SNF              Patient currently is not medically stable to d/c.   Difficult to place patient No            Date of Service 05/18/2020    [facility]   If 7PM-7AM, please contact night-coverage [email or web address] 05/18/2020, 6:19 PM

## 2020-05-18 NOTE — Sepsis Progress Note (Signed)
eLink is monitoring this Code Sepsis. °

## 2020-05-18 NOTE — Consult Note (Signed)
Cardiology Consultation:   Patient ID: Amy [name] MRN: [id number]; DOB: 11-23-1927  Admit date: 05/18/2020 Date of Consult: 05/18/2020  PCP:  Alvester Morin, MD   [place]  Cardiologist:  Ida Rogue, MD  Advanced Practice Provider:  No care team member to display Electrophysiologist:  None    Patient Profile:   Amy [name] is a 87 y.o. female with a hx of CAD s/p prior LAD stenting in 2011, HFpEF, carotid artery disease, GIB, SM2, CKD stage IV, HTN with RAS s/p renal artery stenting in 01/2019, HLD, PAF not on anticoagulation, obesity, hypothyroidism, anemia of chronic disease/iron deficiency anemia, COPD, chronic dyspnea who is being seen today for the evaluation of elevated troponin at the request of Dr. Blaine Hamper.  History of Present Illness:   Ms. Fulcher nderwent remote [facility] in 05/2009 which showed 90% pLAD stenosis s/p PCI/DES, proximal LAD-lesion 40% stenosis, sequential 30%, 40%, 30% stenosis of the RCA with distal RCA 30% stenosis. Most recent ischemic eval was 03/2017 by [facility] which showed no significant ischemia, EF 56%, overall low risk study.   She was admitted 08/2017 with sepsis 2/2 PNA, upper GIB, and acute on chronic HFpEF. She was discharged on plavix without aspirin.   Admitted 6/12-6/13 for atypical chest pain felt to be MSK in etiology. Echo showed EF 60-65%, G1DD, mild aortic stenosis. Cardiology was consulted who did not recommend further inpatient work-p. Pain was reproducible on exam.   She was admitted again 6/18-6/21 for syncope. Work up was [facility] she had some hypotension.   She returned 6/24 for LOC with fall and hit hear head. Potassium found to be 6. Fall felt to be due to polypharmacy. She had mildly elevated troponin that were falt and thought not to be consistent with acute ACS. No further work-up was pursued. She had OP monitor showing rare episodes of narrow complex tachycardia, longest 14  beats.   She was last seen 05/01/20 by video call and was at [facility] for rehab. Overall stable from a cardiac perspective. Was on lokelma for hyperkalemia.   Presented to the ER 05/18/20 with SOB and abdominal pain. She has chronic sob, but it was worse over the last week.  Denied fever, chills, chest pain.   In the ER she was hypotensive. She was placed on 6L O2. Potassium came back at 5.7>5.5. She was given lokelma for hyperkalemia. BNP 748. HS troponin [id number]. Creatinine 1.76, BUN 65. LA 2.9>1.5. CXR with multifocal pna, cardiomegaly with degree of pulmonary vascular congestion. CT abd/pelvis showed  Extensive left-sided pna, no acute intra-abdominal finding. EKG showed SR with 1st degree AV block, RBBB, LPFB and no new ischemic changes. Patient was started on IVF and IV abx and admitted.    Past Medical History:  Diagnosis Date  . Bladder incontinence   . CAD (coronary artery disease)    a. 05/2009 Cath: LAD 90p (Xience 2.75 x 12 mm DES), 17m, D1 40, LCx 40p/m, RCA 30/40/30p/m, RCA 30/25d;  b.  12/2011 Lexiscan MV: no ischemia, breast attenuation artifact, normal EF-->Low risk; c. 10/2016 MV: fixed apical defect, most likely apical thinning and attenuation, No ischemia, EF 60%.  . Cancer ([place])    ovarian  . Carotid arterial disease w/ R Carotid Bruit (HCC)    a. 08/2016 Carotid U/S: 40-59% bilat ICA stenosis - f/u 1 yr.  . Chronic diastolic (congestive) heart failure ([place])    a. Echo 09/2015: EF 55-60% w/ Grade 1 DD, sev Ca2+ MV annulus,  mildly dil LA; b. 09/2016 Echo: EF 65-70%, Gr2 DD, mildly dil LA/RA, nl RV fxn.  . Chronic Dyspnea on exertion   . CKD (chronic kidney disease) stage 3, GFR 30-59 ml/min (HCC)   . Degenerative arthritis of knee    bilateral knees  . Diabetes mellitus    Type II  . GIB (gastrointestinal bleeding)    a. 08/2016 Admission w/ presyncope/anemia/melena-->req Transfusion-->endo ok, colonoscopy w/ polyps but no source of bleeding (most likely diverticular).  .  Hiatal hernia   . Hypertension   . Iron deficiency   . Menopausal symptoms   . Morbid obesity ([place])   . Renal insufficiency   . Thyroid disease    hypothyroidism    Past Surgical History:  Procedure Laterality Date  . COLONOSCOPY  2015  . COLONOSCOPY WITH PROPOFOL N/A 09/25/2016   Procedure: COLONOSCOPY WITH PROPOFOL;  Surgeon: [name], MD;  Location: [facility] ENDOSCOPY;  Service: Gastroenterology;  Laterality: N/A;  . COLONOSCOPY WITH PROPOFOL N/A 09/26/2016   Procedure: COLONOSCOPY WITH PROPOFOL;  Surgeon: [name], MD;  Location: [facility] ENDOSCOPY;  Service: Gastroenterology;  Laterality: N/A;  . ESOPHAGOGASTRODUODENOSCOPY (EGD) WITH PROPOFOL N/A 09/25/2016   Procedure: ESOPHAGOGASTRODUODENOSCOPY (EGD) WITH PROPOFOL;  Surgeon: [name], MD;  Location: [facility] ENDOSCOPY;  Service: Gastroenterology;  Laterality: N/A;  . RENAL ANGIOGRAPHY N/A 02/07/2019   Procedure: RENAL ANGIOGRAPHY;  Surgeon: [name], MD;  Location: [place] CV LAB;  Service: Cardiovascular;  Laterality: N/A;  . REPLACEMENT TOTAL KNEE BILATERAL    . TOTAL VAGINAL HYSTERECTOMY     ovarian mass, not cancerous  . UPPER GI ENDOSCOPY  2015     Home Medications:  Prior to Admission medications   Medication Sig Start Date End Date Taking? Authorizing Provider  acetaminophen (TYLENOL) 500 MG tablet Take 1,000 mg by mouth 3 (three) times daily as needed for mild pain or moderate pain.   Yes [provider]  amitriptyline (ELAVIL) 10 MG tablet Take 10 mg by mouth at bedtime. 12/04/19  Yes [provider]  clopidogrel (PLAVIX) 75 MG tablet Take 1 tablet (75 mg total) by mouth daily. 09/27/16  Yes Dustin Flock, MD  dextromethorphan-guaiFENesin [facility] DM) 30-600 MG 12hr tablet Take 1 tablet by mouth every 12 (twelve) hours.   Yes [provider]  docusate sodium (COLACE) 100 MG capsule Take 1 capsule (100 mg total) by mouth 2 (two) times daily. 03/08/20  Yes Fritzi Mandes, MD  doxazosin  (CARDURA) 2 MG tablet Take 2 mg by mouth 2 (two) times daily. 04/03/20  Yes [provider]  iron polysaccharides (NIFEREX) 150 MG capsule Take 1 capsule (150 mg total) by mouth daily. 02/15/20  Yes Enzo Bi, MD  levETIRAcetam (KEPPRA) 250 MG tablet Take 250 mg by mouth 2 (two) times daily.   Yes [provider]  levothyroxine (SYNTHROID) 88 MCG tablet Take 88 mcg by mouth daily before breakfast. 06/20/19  Yes [provider]  lisinopril (ZESTRIL) 2.5 MG tablet Take 2.5 mg by mouth daily. 01/10/20 01/09/21 Yes [provider]  melatonin 5 MG TABS Take 1 tablet (5 mg total) by mouth at bedtime as needed (sleep). 02/14/20  Yes Enzo Bi, MD  montelukast (SINGULAIR) 10 MG tablet Take 10 mg by mouth at bedtime.  03/24/19  Yes [provider]  pantoprazole (PROTONIX) 20 MG tablet Take 20 mg by mouth daily.    Yes [provider]  polyethylene glycol (MIRALAX / GLYCOLAX) 17 g packet Take 17 g by mouth daily. 03/08/20  Yes Fritzi Mandes, MD  pregabalin (LYRICA) 50 MG capsule Take 50-100 mg by mouth See admin instructions. Take 1 capsule (50mg ) by mouth every morning and take 2 capsules (100mg ) by mouth every night at bedtime   Yes [provider]  simvastatin (ZOCOR) 40 MG tablet Take 1 tablet (40 mg total) by mouth at bedtime. 03/22/18  Yes Gollan, Kathlene November, MD  sodium zirconium cyclosilicate (LOKELMA) 10 g PACK packet Take 10 g by mouth every other day. 04/24/20  Yes Sreenath, Trula Slade, MD  SYMBICORT 160-4.5 MCG/ACT inhaler Inhale 2 puffs into the lungs 2 (two) times daily.  11/15/14  Yes [provider]  torsemide (DEMADEX) 20 MG tablet Take 20 mg by mouth daily at 12 noon.   Yes [provider]  traMADol (ULTRAM) 50 MG tablet Take 50 mg by mouth every 6 (six) hours as needed (pain). 04/19/20  Yes [provider]  albuterol (VENTOLIN HFA) 108 (90 Base) MCG/ACT inhaler Inhale 2 puffs into the lungs every 6 (six) hours as  needed for wheezing or shortness of breath.    [provider]  cyanocobalamin (,VITAMIN B-12,) 1000 MCG/ML injection Inject 1,000 mcg into the muscle every 30 (thirty) days. On the 1st of the month 05/01/20   [provider]  nitroGLYCERIN (NITROSTAT) 0.4 MG SL tablet Place 1 tablet (0.4 mg total) under the tongue every 5 (five) minutes x 3 doses as needed for chest pain. 08/23/19   Enzo Bi, MD    Inpatient Medications: Scheduled Meds: . amitriptyline  10 mg Oral QHS  . clopidogrel  75 mg Oral Daily  . heparin  4,000 Units Intravenous Once  . ipratropium-albuterol  3 mL Nebulization Q4H  . [START ON 05/19/2020] iron polysaccharides  150 mg Oral Daily  . levETIRAcetam  250 mg Oral BID  . [START ON 05/19/2020] levothyroxine  88 mcg Oral Q0600  . mometasone-formoterol  2 puff Inhalation BID  . montelukast  10 mg Oral QHS  . pantoprazole  20 mg Oral Daily  . pregabalin  100 mg Oral QHS  . [START ON 05/19/2020] pregabalin  50 mg Oral Daily  . simvastatin  40 mg Oral QHS  . [START ON 05/19/2020] sodium zirconium cyclosilicate  10 g Oral QODAY   Continuous Infusions: . [START ON 05/19/2020] ceFEPime (MAXIPIME) IV    . heparin    . [START ON 05/20/2020] vancomycin     PRN Meds: acetaminophen, albuterol, dextromethorphan-guaiFENesin, docusate sodium, LORazepam, melatonin, nitroGLYCERIN, ondansetron (ZOFRAN) IV, polyethylene glycol  Allergies:    Allergies  Allergen Reactions  . Atenolol Other (See Comments)    Other reaction(s): Other (See Comments) Decreased heart rate Decreased heart rate  . Codeine Shortness Of Breath    Rash, difficulty breathing, nausea.  . Losartan Other (See Comments)    Hyperkalemia  . Morphine And Related Shortness Of Breath    Rash, difficulty breathing, nausea.   . Fentanyl Other (See Comments)    hallucinations  . Nsaids Other (See Comments)    Other reaction(s): Unknown  . Rofecoxib Other (See Comments)    Other reaction(s): Unknown   . Sucralfate Other (See Comments)    Throat tightness  . Sulfa Antibiotics Other (See Comments)    Other reaction(s): Unknown  . Tramadol Other (See Comments)    lethargy    Social History:   Social History   Socioeconomic History  . Marital status: Widowed    Spouse name: Not on file  . Number of children: Not on  file  . Years of education: Not on file  . Highest education level: Not on file  Occupational History  . Not on file  Tobacco Use  . Smoking status: Never Smoker  . Smokeless tobacco: Never Used  Vaping Use  . Vaping Use: Never used  Substance and Sexual Activity  . Alcohol use: No  . Drug use: No  . Sexual activity: Not on file  Other Topics Concern  . Not on file  Social History Narrative  . Not on file   Social Determinants of Health   Financial Resource Strain: Not on file  Food Insecurity: Not on file  Transportation Needs: Not on file  Physical Activity: Not on file  Stress: Not on file  Social Connections: Not on file  Intimate Partner Violence: Not on file    Family History:    Family History  Problem Relation Age of Onset  . Breast cancer Mother 27     ROS:  Please see the history of present illness.  All other ROS reviewed and negative.     Physical Exam/Data:   Vitals:   05/18/20 1015 05/18/20 1020 05/18/20 1148 05/18/20 1330  BP: (!) 119/44  (!) 117/41 (!) 140/44  Pulse: 81  75 61  Resp: (!) 22  (!) 21 19  Temp:   (!) 97.5 F (36.4 C)   TempSrc:   Oral   SpO2: 92% 97% 94% 100%  Weight:      Height:       No intake or output data in the 24 hours ending 05/18/20 1453 Last 3 Weights 05/18/2020 05/01/2020 04/23/2020  Weight (lbs) 190 lb 175 lb 179 lb 10.8 oz  Weight (kg) 86.183 kg 79.379 kg 81.5 kg  Some encounter information is confidential and restricted. Go to Review Flowsheets activity to see all data.     Body mass index is 34.75 kg/m.  General:  Well nourished, well developed, in no acute distress HEENT: normal Lymph:  no adenopathy Neck: no JVD Endocrine:  No thryomegaly Vascular: No carotid bruits; FA pulses 2+ bilaterally without bruits  Cardiac:  normal S1, S2; RRR; no murmur  Lungs:  Diminished left lung base Abd: soft, nontender, no hepatomegaly  Ext: no edema Musculoskeletal:  No deformities, BUE and BLE strength normal and equal Skin: warm and dry  Neuro:  CNs 2-12 intact, no focal abnormalities noted Psych:  Normal affect   EKG:  The EKG was personally reviewed and demonstrates:  SR, RBBB, first degree AV block.    Relevant CV Studies:  Echo 01/2020  1. Left ventricular ejection fraction, by estimation, is 55 to 60%. The  left ventricle has normal function. The left ventricle has no regional  wall motion abnormalities. There is mild left ventricular hypertrophy.  Left ventricular diastolic parameters  are consistent with Grade II diastolic dysfunction (pseudonormalization).  Elevated left atrial pressure.  2. Right ventricular systolic function is normal. The right ventricular  size is mildly enlarged. There is mildly elevated pulmonary artery  systolic pressure.  3. Left atrial size was mildly dilated.  4. Right atrial size was mildly dilated.  5. The mitral valve is abnormal. Trivial mitral valve regurgitation. Mild  to moderate mitral stenosis. Severe mitral annular calcification.  6. The aortic valve is tricuspid. There is mild calcification of the  aortic valve. There is mild thickening of the aortic valve. Aortic valve  regurgitation is trivial. Mild aortic valve stenosis. Aortic valve mean  gradient measures 12.0 mmHg.  7. The  inferior vena cava is normal in size with <50% respiratory  variability, suggesting right atrial pressure of 8 mmHg.   Myoview 03/2017 Narrative & Impression  Pharmacological myocardial perfusion imaging study with no significant  Ischemia Small region of fixed apical thinning, consistent with attenuation artifact GI uptake artifact  noted Normal wall motion, EF estimated at 56% No EKG changes concerning for ischemia at peak stress or in recovery. Low risk scan   Signed, Esmond Plants, MD, Ph.D [facility]     Laboratory Data:  High Sensitivity Troponin:   Recent Labs  Lab 05/18/20 0900 05/18/20 1110  TROPONINIHS 638* 933*     Chemistry Recent Labs  Lab 05/18/20 0900  NA 142  K 4.6  CL 108  CO2 25  GLUCOSE 119*  BUN 34*  CREATININE 1.66*  CALCIUM 7.7*  GFRNONAA 29*  ANIONGAP 9    Recent Labs  Lab 05/18/20 0900  PROT 5.6*  ALBUMIN 3.0*  AST 19  ALT 8  ALKPHOS 80  BILITOT 0.9   Hematology Recent Labs  Lab 05/18/20 0900  WBC 4.7  RBC 3.42*  HGB 10.2*  HCT 33.6*  MCV 98.2  MCH 29.8  MCHC 30.4  RDW 15.6*  PLT 260   BNP Recent Labs  Lab 05/18/20 1110  BNP 748.3*    DDimer No results for input(s): DDIMER in the last 168 hours.   Radiology/Studies:  DG Chest Port 1 View  Result Date: 05/18/2020 CLINICAL DATA:  Shortness of breath and fever EXAM: PORTABLE CHEST 1 VIEW COMPARISON:  March 06, 2020 FINDINGS: There is extensive airspace opacity throughout most of the left lung. The right lung is clear. There is cardiomegaly with mild pulmonary venous hypertension. No adenopathy. There is aortic atherosclerosis. There is calcification in each carotid artery. No bone lesions. IMPRESSION: Widespread airspace opacity throughout much of the left lung consistent with multifocal pneumonia. Right lung clear. There is cardiomegaly with a degree of pulmonary vascular congestion. There is calcification in each carotid artery. Aortic Atherosclerosis (ICD10-I70.0). Electronically Signed   By: Lowella Grip III M.D.   On: 05/18/2020 09:08   CT CHEST ABDOMEN PELVIS WO CONTRAST  Result Date: 05/18/2020 CLINICAL DATA:  Chest pain or shortness of breath. Pleural effusion or pleurisy suspected EXAM: CT CHEST, ABDOMEN AND PELVIS WITHOUT CONTRAST TECHNIQUE: Multidetector CT imaging of the chest,  abdomen and pelvis was performed following the standard protocol without IV contrast. COMPARISON:  01/13/2017 FINDINGS: CT CHEST FINDINGS Cardiovascular: Normal heart size. No pericardial effusion. Bulky atherosclerotic calcification of the aorta and great vessels. Coronary atherosclerosis. Mediastinum/Nodes: No adenopathy or mass. Moderate sliding hiatal hernia. Lungs/Pleura: Dense consolidation throughout the left lung with poor visualization of central airways. Small left pleural effusion. Musculoskeletal: Nonacute right humeral neck fracture with marked anterior displacement that is similar to initial diagnosis February 2022 by radiography. Extensive spondylosis and thoracic spine ankylosis. Multilevel degenerative facet spurring especially at the open T6-7 level where there is biforaminal impingement. CT ABDOMEN PELVIS FINDINGS Hepatobiliary: No focal liver abnormality.No evidence of biliary obstruction or stone. Pancreas: Unremarkable. Spleen: Unremarkable. Adrenals/Urinary Tract: Negative adrenals. No hydronephrosis or stone. Left upper pole renal lesion which is a cyst by ultrasound in 2020. Peripherally calcified structure in the left upper pole which was also described on prior. Unremarkable bladder. Stomach/Bowel: No obstruction. No visible bowel inflammation. Numerous distal colonic diverticula. Distal colonic stool with moderate distension of the rectum. No superimposed rectal wall thickening. Vascular/Lymphatic: No acute vascular abnormality. No mass or adenopathy. Reproductive:Hysterectomy Other: No  ascites or pneumoperitoneum. Musculoskeletal: No acute abnormalities. Severe lumbar spine degeneration which is generalized. Generalized osteopenia. Hip osteoarthritis that is particularly advanced on the right. IMPRESSION: 1. Extensive left-sided pneumonia opacifying most of the left lung. Small left parapneumonic effusion. 2. No acute intra-abdominal finding. 3. Numerous chronic/known incidental findings  which are described above. Electronically Signed   By: Monte Fantasia M.D.   On: 05/18/2020 11:21     Assessment and Plan:   Elevated troponin Known CAD with prior stenting - Elevated troponin elevated to 933 in the setting of Sepsis 2/2 pna, hyperkalemia, hypotension - she denies chest pain but has known CAD. Cath in 2011. Last ischemic eval was in 03/2017 by Leane Call showing no new ischemia, overall low risk  - Echo from 01/2020 showed preserved EF, no WMA - continue to trend troponin - EKG with no new ischemic changes - Will discuss IV heparin with MD - suspect mainly demand ischemia. Do not think we will pursue further ischemic work-up at this time.  - continue plavix  Sepsis Severe left-sided PNA - LLL pna on imaging - On supplemental O2 - Lactic acid improved with IVF - WBC wnl - IV abx per IM  HFpEF - Last echo was 01/2020 showing preserved EF with G2DD - On admission BNP elevated and possible vascular congestion - Appears euvolemic on exam - continue home medications - Do no think we need to repeat an echo at this time - continue home Torsemide, careful with IVF - continue ACE. Not on BB with history of bradycardia/dizziness/syncope  PAF not on anticoagulation - Not on AC due to high fall risk - In SR  HTN with hypotension - PTA lisinopril 2.5mg  daily, restart when able  HLD - continue statin  For questions or updates, please contact [facility] Please consult [email or web address] for contact info under    Signed, Cadence Ninfa Meeker, PA-C  05/18/2020 2:53 PM

## 2020-05-18 NOTE — Consult Note (Signed)
PHARMACY -  BRIEF ANTIBIOTIC NOTE   Pharmacy has received consult(s) for Cefepime/Vancomycin from an ED provider.  The patient's profile has been reviewed for ht/wt/allergies/indication/available labs.    One time order(s) placed for Cefepime 2g IV x 1, Vancomycin 1g IV x 1  Will order additional 750mg  IV Vancomycin x 1 to complete 1750mg  loading dose.  Further antibiotics/pharmacy consults should be ordered by admitting physician if indicated.                        Thank you,  Lu Duffel, PharmD, BCPS Clinical Pharmacist 05/18/2020 9:48 AM

## 2020-05-18 NOTE — Progress Notes (Signed)
Pharmacy Antibiotic Note  Amy [name] is a 87 y.o. female with PMH of CAD, CHF, CKD, DM, HTN, hypothyroidism is admitted on 05/18/2020 with pneumonia and ACS.  Pharmacy has been consulted for vancomycin and cefepime dosing. While in the ED she received 2 grams IV cefepime and a total of 1750 mg IV vancomycin. Her SCr on admission is noted to be higher than her apparent baseline level  Plan:  1) start cefepime 2 grams IV every 24 hours  2) start vancomycin 750 mg IV Q 48 hrs  Goal AUC 400-550  Expected AUC: 461.7  SCr used: 1.66  Ke: 0.019 h-1, T1/2: 36.7h  Daily SCr while on IV vancomycin   Height: 5\' 2"  (157.5 cm) Weight: 86.2 kg (190 lb) IBW/kg (Calculated) : 50.1  Temp (24hrs), Avg:98.8 F (37.1 C), Min:97.5 F (36.4 C), Max:99.5 F (37.5 C)  Recent Labs  Lab 05/18/20 0900 05/18/20 1110  WBC 4.7  --   CREATININE 1.66*  --   LATICACIDVEN 2.9* 1.5    Estimated Creatinine Clearance: 22.5 mL/min (A) (by C-G formula based on SCr of 1.66 mg/dL (H)).    Allergies  Allergen Reactions  . Atenolol Other (See Comments)    Other reaction(s): Other (See Comments) Decreased heart rate Decreased heart rate  . Codeine Shortness Of Breath    Rash, difficulty breathing, nausea.  . Losartan Other (See Comments)    Hyperkalemia  . Morphine And Related Shortness Of Breath    Rash, difficulty breathing, nausea.   . Fentanyl Other (See Comments)    hallucinations  . Nsaids Other (See Comments)    Other reaction(s): Unknown  . Rofecoxib Other (See Comments)    Other reaction(s): Unknown  . Sucralfate Other (See Comments)    Throat tightness  . Sulfa Antibiotics Other (See Comments)    Other reaction(s): Unknown  . Tramadol Other (See Comments)    lethargy    Antimicrobials this admission: vancomycin 3/25 >>  cefepime 3/25 >>   Microbiology results: 3/25 BCx: pending 3/25 UCx: pending  3/25 SARS CoV-2: positive 3/25 influenza A/B: negative  3/25 MRSA PCR:  pending  Thank you for allowing pharmacy to be a part of this patient's care.  Dallie Piles 05/18/2020 12:44 PM

## 2020-05-18 NOTE — ED Triage Notes (Signed)
Pt brought in via EMS from [facility].  C/O abdominal pain, fever, SOB that started yesterday.  EMS states pt was 85% RA, increased to 94% on 15L  Non-rebreather.  Initial pressure 80/46, 500 NS bolus  given en route.  CBG 157 by EMS.

## 2020-05-18 NOTE — Progress Notes (Signed)
CODE SEPSIS - PHARMACY COMMUNICATION  **Broad Spectrum Antibiotics should be administered within 1 hour of Sepsis diagnosis**  Time Code Sepsis Called/Page Received: 2787  Antibiotics Ordered: Cefepime/Vancomycin/Metronidazole  Time of 1st antibiotic administration: 0958  Additional action taken by pharmacy: None  If necessary, Name of Provider/Nurse Contacted: [place] ,PharmD Clinical Pharmacist  05/18/2020  9:50 AM

## 2020-05-18 NOTE — ED Provider Notes (Signed)
[facility] Emergency Department Provider Note  ____________________________________________   Event Date/Time   First MD Initiated Contact with Patient 05/18/20 [phone]     (approximate)  I have reviewed the triage vital signs and the nursing notes.   HISTORY  Chief Complaint Abdominal Pain, Shortness of Breath, and Fever    HPI Amy [name] is a 87 y.o. female with coronary disease, congestive heart failure, CKD who comes in for abdominal pain, shortness of breath and fever.  [facility] fracture being treated conservatively.  Patient was also recently treated for hyperkalemia and is supposed to be on Lokelma 10 g every day.  Patient comes in for abdominal pain that started yesterday, constant, nothing makes better, nothing makes it worse.  She also reports worsening shortness of breath.  She had a fever of 101 at the facility and with [place] is 99.5.  Patient was initially nonrebreather and was able to get placed on 6 L satting 93 to 94%.  Patient already received 500 of normal saline with EMS          Past Medical History:  Diagnosis Date  . Bladder incontinence   . CAD (coronary artery disease)    a. 05/2009 Cath: LAD 90p (Xience 2.75 x 12 mm DES), 47m, D1 40, LCx 40p/m, RCA 30/40/30p/m, RCA 30/25d;  b.  12/2011 Lexiscan MV: no ischemia, breast attenuation artifact, normal EF-->Low risk; c. 10/2016 MV: fixed apical defect, most likely apical thinning and attenuation, No ischemia, EF 60%.  . Cancer ([place])    ovarian  . Carotid arterial disease w/ R Carotid Bruit (HCC)    a. 08/2016 Carotid U/S: 40-59% bilat ICA stenosis - f/u 1 yr.  . Chronic diastolic (congestive) heart failure ([place])    a. Echo 09/2015: EF 55-60% w/ Grade 1 DD, sev Ca2+ MV annulus, mildly dil LA; b. 09/2016 Echo: EF 65-70%, Gr2 DD, mildly dil LA/RA, nl RV fxn.  . Chronic Dyspnea on exertion   . CKD (chronic kidney disease)  stage 3, GFR 30-59 ml/min (HCC)   . Degenerative arthritis of knee    bilateral knees  . Diabetes mellitus    Type II  . GIB (gastrointestinal bleeding)    a. 08/2016 Admission w/ presyncope/anemia/melena-->req Transfusion-->endo ok, colonoscopy w/ polyps but no source of bleeding (most likely diverticular).  . Hiatal hernia   . Hypertension   . Iron deficiency   . Menopausal symptoms   . Morbid obesity ([place])   . Renal insufficiency   . Thyroid disease    hypothyroidism    Patient Active Problem List   Diagnosis Date Noted  . Fall 04/23/2020  . Closed right arm fracture 04/23/2020  . T3 vertebral fracture ([place]) 04/23/2020  . Closed nondisplaced fracture of head of right radius   . Atrial fibrillation ([place]) 03/06/2020  . COVID-19 virus infection 03/06/2020  . Seizure ([place]) 03/06/2020  . New onset seizure ([place]) 03/06/2020  . Acute on chronic diastolic heart failure ([place]) 02/02/2020  . Moderate persistent asthma 02/02/2020  . SOB (shortness of breath) 02/02/2020  . Anemia associated with chronic renal failure 10/22/2019  . Postherpetic neuralgia 10/18/2019  . Neuropathic pain of chest 10/18/2019  . Chronic pain syndrome 10/18/2019  . NSTEMI (non-ST elevated myocardial infarction) ([place]) 08/18/2019  . Syncope and collapse 08/13/2019  . Unresponsiveness 08/12/2019  . Hyperkalemia 08/12/2019  . HTN (hypertension) 08/12/2019  . GERD (gastroesophageal reflux disease) 08/12/2019  . Hypothyroidism 08/12/2019  .  CAD (coronary artery disease) 08/12/2019  . Shingles 08/12/2019  . Carotid arterial disease ([place])   . Hypotension   . Groin pain, right   . Chest pain 08/06/2019  . Renovascular hypertension 03/08/2019  . Renal artery stenosis ([place]) 01/28/2019  . Multiple renal cysts 12/29/2018  . Renal stone 12/29/2018  . CKD (chronic kidney disease) stage 4, GFR 15-29 ml/min (HCC) 11/23/2018  . Diabetes ([place]) 06/29/2017  . CAP (community acquired pneumonia) 05/16/2017  . GI bleed  09/21/2016  . Vitamin D deficiency 11/04/2015  . Multiple lung nodules   . COPD with chronic bronchitis ([place])   . New onset atrial fibrillation ([place]) 10/04/2015  . Anemia 10/04/2015  . Acute kidney injury superimposed on CKD ([place]) 10/03/2015  . Palliative care encounter 06/05/2015  . Morbid obesity ([place]) 12/05/2014  . Angina pectoris associated with type 2 diabetes mellitus ([place]) 12/05/2014  . Type 2 diabetes mellitus with hyperglycemia ([place]) 08/22/2014  . Chronic diastolic CHF (congestive heart failure) ([place]) 09/06/2013  . Preop cardiovascular exam 06/03/2012  . Iron deficiency anemia 09/08/2011  . HLD (hyperlipidemia) 07/18/2009  . Coronary artery disease involving native coronary artery of native heart without angina pectoris 07/18/2009  . Acquired hypothyroidism 07/12/2009  . Essential hypertension 07/12/2009    Past Surgical History:  Procedure Laterality Date  . COLONOSCOPY  2015  . COLONOSCOPY WITH PROPOFOL N/A 09/25/2016   Procedure: COLONOSCOPY WITH PROPOFOL;  Surgeon: [name], MD;  Location: [facility] ENDOSCOPY;  Service: Gastroenterology;  Laterality: N/A;  . COLONOSCOPY WITH PROPOFOL N/A 09/26/2016   Procedure: COLONOSCOPY WITH PROPOFOL;  Surgeon: [name], MD;  Location: [facility] ENDOSCOPY;  Service: Gastroenterology;  Laterality: N/A;  . ESOPHAGOGASTRODUODENOSCOPY (EGD) WITH PROPOFOL N/A 09/25/2016   Procedure: ESOPHAGOGASTRODUODENOSCOPY (EGD) WITH PROPOFOL;  Surgeon: [name], MD;  Location: [facility] ENDOSCOPY;  Service: Gastroenterology;  Laterality: N/A;  . RENAL ANGIOGRAPHY N/A 02/07/2019   Procedure: RENAL ANGIOGRAPHY;  Surgeon: [name], MD;  Location: [facility] CV LAB;  Service: Cardiovascular;  Laterality: N/A;  . REPLACEMENT TOTAL KNEE BILATERAL    . TOTAL VAGINAL HYSTERECTOMY     ovarian mass, not cancerous  . UPPER GI ENDOSCOPY  2015    Prior to Admission medications   Medication Sig Start Date End Date Taking? Authorizing Provider  acetaminophen  (TYLENOL) 500 MG tablet Take 1,000 mg by mouth 3 (three) times daily as needed for mild pain or moderate pain.    [provider]  albuterol (VENTOLIN HFA) 108 (90 Base) MCG/ACT inhaler Inhale into the lungs every 6 (six) hours as needed for wheezing or shortness of breath.    [provider]  amitriptyline (ELAVIL) 10 MG tablet Take 10 mg by mouth at bedtime. 12/04/19   [provider]  clopidogrel (PLAVIX) 75 MG tablet Take 1 tablet (75 mg total) by mouth daily. 09/27/16   Dustin Flock, MD  docusate sodium (COLACE) 100 MG capsule Take 1 capsule (100 mg total) by mouth 2 (two) times daily. 03/08/20   Fritzi Mandes, MD  doxazosin (CARDURA) 2 MG tablet Take by mouth 2 (two) times daily. 04/03/20   [provider]  iron polysaccharides (NIFEREX) 150 MG capsule Take 1 capsule (150 mg total) by mouth daily. 02/15/20   Enzo Bi, MD  levETIRAcetam (KEPPRA) 500 MG tablet Take 1 tablet (500 mg total) by mouth 2 (two) times daily. 03/08/20   Fritzi Mandes, MD  levothyroxine (SYNTHROID) 88 MCG tablet 88 mcg daily before breakfast.  06/20/19   [provider]  lisinopril (ZESTRIL) 2.5 MG tablet Take 2.5 mg by mouth daily. 01/10/20 01/09/21  [provider]  melatonin 5 MG TABS Take 1 tablet (5 mg total) by mouth at bedtime as needed (sleep). 02/14/20   Enzo Bi, MD  montelukast (SINGULAIR) 10 MG tablet Take 10 mg by mouth at bedtime.  03/24/19   [provider]  nitroGLYCERIN (NITROSTAT) 0.4 MG SL tablet Place 1 tablet (0.4 mg total) under the tongue every 5 (five) minutes x 3 doses as needed for chest pain. 08/23/19   Enzo Bi, MD  pantoprazole (PROTONIX) 20 MG tablet Take 20 mg by mouth daily.     [provider]  polyethylene glycol (MIRALAX / GLYCOLAX) 17 g packet Take 17 g by mouth daily. 03/08/20   Fritzi Mandes, MD  pregabalin (LYRICA) 50 MG capsule Take 50-100 mg by mouth See admin instructions. Take 1 capsule (50mg ) by mouth every morning and  take 2 capsules (100mg ) by mouth every night at bedtime    [provider]  simvastatin (ZOCOR) 40 MG tablet Take 1 tablet (40 mg total) by mouth at bedtime. 03/22/18   Minna Merritts, MD  sodium zirconium cyclosilicate (LOKELMA) 10 g PACK packet Take 10 g by mouth every other day. 04/24/20   Sidney Ace, MD  SYMBICORT 160-4.5 MCG/ACT inhaler Inhale 2 puffs into the lungs 2 (two) times daily.  11/15/14   [provider]  torsemide (DEMADEX) 20 MG tablet Take 20 mg by mouth daily.    [provider]  traMADol (ULTRAM) 50 MG tablet Take 50 mg by mouth every 6 (six) hours as needed. 04/19/20   [provider]    Allergies Atenolol, Codeine, Losartan, Morphine and related, Fentanyl, Nsaids, Rofecoxib, Sucralfate, Sulfa antibiotics, and Tramadol  Family History  Problem Relation Age of Onset  . Breast cancer Mother 13    Social History Social History   Tobacco Use  . Smoking status: Never Smoker  . Smokeless tobacco: Never Used  Vaping Use  . Vaping Use: Never used  Substance Use Topics  . Alcohol use: No  . Drug use: No      Review of Systems Constitutional: No fever/chills Eyes: No visual changes. ENT: No sore throat. Cardiovascular: Denies chest pain. Respiratory: + SOB  Gastrointestinal: + abd pain.  No nausea, no vomiting.  No diarrhea.  No constipation. Genitourinary: Negative for dysuria. Musculoskeletal: Negative for back pain. Skin: Negative for rash. Neurological: Negative for headaches, focal weakness or numbness. All other ROS negative ____________________________________________   PHYSICAL EXAM:  VITAL SIGNS: ED Triage Vitals  Enc Vitals Group     BP 05/18/20 0851 (!) 96/36     Pulse Rate 05/18/20 0851 98     Resp 05/18/20 0851 (!) 25     Temp 05/18/20 0851 99.5 F (37.5 C)     Temp Source 05/18/20 0851 Oral     SpO2 05/18/20 0845 100 %     Weight 05/18/20 0853 190 lb (86.2 kg)     Height 05/18/20 0853 5\' 2"   (1.575 m)     Head Circumference --      Peak Flow --      Pain Score --      Pain Loc --      Pain Edu? --      Excl. in [place]? --     Constitutional: Alert and oriented x3.  Appears unwell Eyes: Conjunctivae are normal. EOMI. Head: Atraumatic. Nose: No congestion/rhinnorhea. Mouth/Throat: Mucous membranes are moist.  Neck: No stridor. Trachea Midline. FROM Cardiovascular: Normal rate, regular rhythm. Grossly normal heart sounds.  Good peripheral circulation. Respiratory: Normal respiratory effort.  No retractions.  Coarse lung sounds, nonrebreather in place Gastrointestinal: Soft and nontender. No distention. No abdominal bruits.  Musculoskeletal: No lower extremity tenderness nor edema.  No joint effusions. Neurologic:  Normal speech and language. No gross focal neurologic deficits are appreciated.  Skin:  Skin is warm, dry and intact. No rash noted. Psychiatric: Mood and affect are normal. Speech and behavior are normal. GU: Deferred   ____________________________________________   LABS (all labs ordered are listed, but only abnormal results are displayed)  Labs Reviewed  RESP PANEL BY RT-PCR (FLU A&B, COVID) ARPGX2 - Abnormal; Notable for the following components:      Result Value   SARS Coronavirus 2 by RT PCR POSITIVE (*)    All other components within normal limits  LACTIC ACID, PLASMA - Abnormal; Notable for the following components:   Lactic Acid, Venous 2.9 (*)    All other components within normal limits  COMPREHENSIVE METABOLIC PANEL - Abnormal; Notable for the following components:   Glucose, Bld 119 (*)    BUN 34 (*)    Creatinine, Ser 1.66 (*)    Calcium 7.7 (*)    Total Protein 5.6 (*)    Albumin 3.0 (*)    GFR, Estimated 29 (*)    All other components within normal limits  CBC WITH DIFFERENTIAL/PLATELET - Abnormal; Notable for the following components:   RBC 3.42 (*)    Hemoglobin 10.2 (*)    HCT 33.6 (*)    RDW 15.6 (*)    Lymphs Abs 0.5 (*)     All other components within normal limits  URINALYSIS, COMPLETE (UACMP) WITH MICROSCOPIC - Abnormal; Notable for the following components:   Color, Urine YELLOW (*)    APPearance HAZY (*)    Protein, ur 30 (*)    All other components within normal limits  BRAIN NATRIURETIC PEPTIDE - Abnormal; Notable for the following components:   B Natriuretic Peptide 748.3 (*)    All other components within normal limits  TROPONIN I (HIGH SENSITIVITY) - Abnormal; Notable for the following components:   Troponin I (High Sensitivity) 638 (*)    All other components within normal limits  TROPONIN I (HIGH SENSITIVITY) - Abnormal; Notable for the following components:   Troponin I (High Sensitivity) 933 (*)    All other components within normal limits  CULTURE, BLOOD (ROUTINE X 2)  CULTURE, BLOOD (ROUTINE X 2)  URINE CULTURE  MRSA PCR SCREENING  LACTIC ACID, PLASMA  PROTIME-INR  APTT  PROCALCITONIN  HEPARIN LEVEL (UNFRACTIONATED)  TROPONIN I (HIGH SENSITIVITY)   ____________________________________________   ED ECG REPORT I, Vanessa [place], the attending physician, personally viewed and interpreted this ECG.  Sinus rate of 94, right bundle branch block, no ST elevation, no T wave inversion, right bundle branch block and left posterior fascicular block, type I AV block ____________________________________________  RADIOLOGY Robert Bellow, personally viewed and evaluated these images (plain radiographs) as part of my medical decision making, as well as reviewing the written report by the radiologist.  ED MD interpretation: Left-sided pneumonia  Official radiology report(s): DG Chest Port 1 View  Result Date: 05/18/2020 CLINICAL DATA:  Shortness of breath and fever EXAM: PORTABLE CHEST 1 VIEW COMPARISON:  March 06, 2020 FINDINGS: There is extensive airspace opacity throughout most of the left lung. The right lung is clear. There is cardiomegaly  with mild pulmonary venous hypertension. No  adenopathy. There is aortic atherosclerosis. There is calcification in each carotid artery. No bone lesions. IMPRESSION: Widespread airspace opacity throughout much of the left lung consistent with multifocal pneumonia. Right lung clear. There is cardiomegaly with a degree of pulmonary vascular congestion. There is calcification in each carotid artery. Aortic Atherosclerosis (ICD10-I70.0). Electronically Signed   By: Lowella Grip III M.D.   On: 05/18/2020 09:08   CT CHEST ABDOMEN PELVIS WO CONTRAST  Result Date: 05/18/2020 CLINICAL DATA:  Chest pain or shortness of breath. Pleural effusion or pleurisy suspected EXAM: CT CHEST, ABDOMEN AND PELVIS WITHOUT CONTRAST TECHNIQUE: Multidetector CT imaging of the chest, abdomen and pelvis was performed following the standard protocol without IV contrast. COMPARISON:  01/13/2017 FINDINGS: CT CHEST FINDINGS Cardiovascular: Normal heart size. No pericardial effusion. Bulky atherosclerotic calcification of the aorta and great vessels. Coronary atherosclerosis. Mediastinum/Nodes: No adenopathy or mass. Moderate sliding hiatal hernia. Lungs/Pleura: Dense consolidation throughout the left lung with poor visualization of central airways. Small left pleural effusion. Musculoskeletal: Nonacute right humeral neck fracture with marked anterior displacement that is similar to initial diagnosis February 2022 by radiography. Extensive spondylosis and thoracic spine ankylosis. Multilevel degenerative facet spurring especially at the open T6-7 level where there is biforaminal impingement. CT ABDOMEN PELVIS FINDINGS Hepatobiliary: No focal liver abnormality.No evidence of biliary obstruction or stone. Pancreas: Unremarkable. Spleen: Unremarkable. Adrenals/Urinary Tract: Negative adrenals. No hydronephrosis or stone. Left upper pole renal lesion which is a cyst by ultrasound in 2020. Peripherally calcified structure in the left upper pole which was also described on prior.  Unremarkable bladder. Stomach/Bowel: No obstruction. No visible bowel inflammation. Numerous distal colonic diverticula. Distal colonic stool with moderate distension of the rectum. No superimposed rectal wall thickening. Vascular/Lymphatic: No acute vascular abnormality. No mass or adenopathy. Reproductive:Hysterectomy Other: No ascites or pneumoperitoneum. Musculoskeletal: No acute abnormalities. Severe lumbar spine degeneration which is generalized. Generalized osteopenia. Hip osteoarthritis that is particularly advanced on the right. IMPRESSION: 1. Extensive left-sided pneumonia opacifying most of the left lung. Small left parapneumonic effusion. 2. No acute intra-abdominal finding. 3. Numerous chronic/known incidental findings which are described above. Electronically Signed   By: Monte Fantasia M.D.   On: 05/18/2020 11:21    ____________________________________________   PROCEDURES  Procedure(s) performed (including Critical Care):  .1-3 Lead EKG Interpretation Performed by: Vanessa Emmet, MD Authorized by: Vanessa Howard, MD     Interpretation: normal     ECG rate assessment: normal     Rhythm: sinus rhythm     Ectopy: none     Conduction: normal   .Critical Care Performed by: Vanessa [place], MD Authorized by: Vanessa , MD   Critical care provider statement:    Critical care time (minutes):  45   Critical care was necessary to treat or prevent imminent or life-threatening deterioration of the following conditions:  Sepsis   Critical care was time spent personally by me on the following activities:  Discussions with consultants, evaluation of patient's response to treatment, examination of patient, ordering and performing treatments and interventions, ordering and review of laboratory studies, ordering and review of radiographic studies, pulse oximetry, re-evaluation of patient's condition, obtaining history from patient or surrogate and review of old  charts     ____________________________________________   INITIAL IMPRESSION / ASSESSMENT AND PLAN / ED COURSE  Amy [name] was evaluated in Emergency Department on 05/18/2020 for the symptoms described in the history of present illness. She was evaluated in the  context of the global COVID-19 pandemic, which necessitated consideration that the patient might be at risk for infection with the SARS-CoV-2 virus that causes COVID-19. Institutional protocols and algorithms that pertain to the evaluation of patients at risk for COVID-19 are in a state of rapid change based on information released by regulatory bodies including the CDC and federal and state organizations. These policies and algorithms were followed during the patient's care in the ED.    Sepsis alert called.  Consider pneumonia, abdominal pathology given abdominal pain she is appendicitis, diverticulitis, UTI.  Broad-spectrum antibiotics started.  Patient placed on 6 L but oxygen levels were low so need to be placed on high flow nasal cannula.  CT scans consistent with left-sided pneumonia.  Unable to do CT PE to rule out pulmonary embolism due to kidney function at this time I have lower suspicion given her elevated procalcitonin and her work-up seems more consistent with pneumonia.  Covid swab positive I suspect that this is old given her prior infection in January.  Her troponin is elevated and I did discuss possible team about this but given no chest pain we will hold off on heparin at this time.  They are going to consult cardiology for this.  On my repeat evaluation blood pressures are better after fluid and we can hold off on pressors       ____________________________________________   FINAL CLINICAL IMPRESSION(S) / ED DIAGNOSES   Final diagnoses:  Sepsis, due to unspecified organism, unspecified whether acute organ dysfunction present ([place])  Acute respiratory failure with hypoxia ([place])  Community acquired pneumonia  of left lung, unspecified part of lung      MEDICATIONS GIVEN DURING THIS VISIT:  Medications  metroNIDAZOLE (FLAGYL) IVPB 500 mg (has no administration in time range)  ipratropium-albuterol (DUONEB) 0.5-2.5 (3) MG/3ML nebulizer solution 3 mL (3 mLs Nebulization Given 05/18/20 1344)  albuterol (PROVENTIL) (2.5 MG/3ML) 0.083% nebulizer solution 2.5 mg (has no administration in time range)  dextromethorphan-guaiFENesin (MUCINEX DM) 30-600 MG per 12 hr tablet 1 tablet (has no administration in time range)  ondansetron (ZOFRAN) injection 4 mg (has no administration in time range)  acetaminophen (TYLENOL) tablet 650 mg (has no administration in time range)  heparin bolus via infusion 4,000 Units (has no administration in time range)  heparin ADULT infusion 100 units/mL (25000 units/229mL) (has no administration in time range)  ceFEPIme (MAXIPIME) 2 g in sodium chloride 0.9 % 100 mL IVPB (has no administration in time range)  vancomycin (VANCOREADY) IVPB 750 mg/150 mL (has no administration in time range)  ceFEPIme (MAXIPIME) 2 g in sodium chloride 0.9 % 100 mL IVPB (0 g Intravenous Stopped 05/18/20 1141)  vancomycin (VANCOCIN) IVPB 1000 mg/200 mL premix (0 mg Intravenous Stopped 05/18/20 1242)  sodium chloride 0.9 % bolus 1,000 mL (0 mLs Intravenous Stopped 05/18/20 1243)  vancomycin (VANCOREADY) IVPB 750 mg/150 mL (750 mg Intravenous New Bag/Given 05/18/20 1242)     ED Discharge Orders    None       Note:  This document was prepared using Dragon voice recognition software and may include unintentional dictation errors.   Vanessa [place], MD 05/18/20 [phone]

## 2020-05-18 NOTE — Progress Notes (Signed)
[place] for heparin infusion Indication: chest pain/ACS  Allergies  Allergen Reactions  . Atenolol Other (See Comments)    Other reaction(s): Other (See Comments) Decreased heart rate Decreased heart rate  . Codeine Shortness Of Breath    Rash, difficulty breathing, nausea.  . Losartan Other (See Comments)    Hyperkalemia  . Morphine And Related Shortness Of Breath    Rash, difficulty breathing, nausea.   . Fentanyl Other (See Comments)    hallucinations  . Nsaids Other (See Comments)    Other reaction(s): Unknown  . Rofecoxib Other (See Comments)    Other reaction(s): Unknown  . Sucralfate Other (See Comments)    Throat tightness  . Sulfa Antibiotics Other (See Comments)    Other reaction(s): Unknown  . Tramadol Other (See Comments)    lethargy    Patient Measurements: Height: 5\' 2"  (157.5 cm) Weight: 86.2 kg (190 lb) IBW/kg (Calculated) : 50.1 Heparin Dosing Weight: 70 kg  Vital Signs: Temp: 97.5 F (36.4 C) (03/25 1148) Temp Source: Oral (03/25 1148) BP: 117/41 (03/25 1148) Pulse Rate: 75 (03/25 1148)  Labs: Recent Labs    05/18/20 0900 05/18/20 1110  HGB 10.2*  --   HCT 33.6*  --   PLT 260  --   APTT 31  --   LABPROT 14.4  --   INR 1.2  --   CREATININE 1.66*  --   TROPONINIHS 638* 933*    Estimated Creatinine Clearance: 22.5 mL/min (A) (by C-G formula based on SCr of 1.66 mg/dL (H)).   Medical History: Past Medical History:  Diagnosis Date  . Bladder incontinence   . CAD (coronary artery disease)    a. 05/2009 Cath: LAD 90p (Xience 2.75 x 12 mm DES), 50m, D1 40, LCx 40p/m, RCA 30/40/30p/m, RCA 30/25d;  b.  12/2011 Lexiscan MV: no ischemia, breast attenuation artifact, normal EF-->Low risk; c. 10/2016 MV: fixed apical defect, most likely apical thinning and attenuation, No ischemia, EF 60%.  . Cancer ([place])    ovarian  . Carotid arterial disease w/ R Carotid Bruit (HCC)    a. 08/2016 Carotid U/S: 40-59%  bilat ICA stenosis - f/u 1 yr.  . Chronic diastolic (congestive) heart failure ([place])    a. Echo 09/2015: EF 55-60% w/ Grade 1 DD, sev Ca2+ MV annulus, mildly dil LA; b. 09/2016 Echo: EF 65-70%, Gr2 DD, mildly dil LA/RA, nl RV fxn.  . Chronic Dyspnea on exertion   . CKD (chronic kidney disease) stage 3, GFR 30-59 ml/min (HCC)   . Degenerative arthritis of knee    bilateral knees  . Diabetes mellitus    Type II  . GIB (gastrointestinal bleeding)    a. 08/2016 Admission w/ presyncope/anemia/melena-->req Transfusion-->endo ok, colonoscopy w/ polyps but no source of bleeding (most likely diverticular).  . Hiatal hernia   . Hypertension   . Iron deficiency   . Menopausal symptoms   . Morbid obesity ([place])   . Renal insufficiency   . Thyroid disease    hypothyroidism    Assessment: 87 y.o. female with coronary disease, congestive heart failure, CKD who comes in for abdominal pain, shortness of breath and fever. hs troponin 638-->933. Her Med Rec has been completed and she is noted to be on clopidogrel prior to admission.  Goal of Therapy:  Heparin level 0.3-0.7 units/ml Monitor platelets by anticoagulation protocol: Yes   Plan:  Give 4000 units bolus x 1 Start heparin infusion at 850 units/hr Check anti-Xa level in  8 hours and daily while on heparin Continue to monitor H&H and platelets  Dallie Piles 05/18/2020,12:44 PM

## 2020-05-19 DIAGNOSIS — J189 Pneumonia, unspecified organism: Secondary | ICD-10-CM | POA: Diagnosis not present

## 2020-05-19 DIAGNOSIS — I480 Paroxysmal atrial fibrillation: Secondary | ICD-10-CM | POA: Diagnosis not present

## 2020-05-19 DIAGNOSIS — R778 Other specified abnormalities of plasma proteins: Secondary | ICD-10-CM

## 2020-05-19 DIAGNOSIS — A419 Sepsis, unspecified organism: Secondary | ICD-10-CM | POA: Diagnosis not present

## 2020-05-19 DIAGNOSIS — I214 Non-ST elevation (NSTEMI) myocardial infarction: Secondary | ICD-10-CM | POA: Diagnosis not present

## 2020-05-19 DIAGNOSIS — R6520 Severe sepsis without septic shock: Secondary | ICD-10-CM | POA: Diagnosis not present

## 2020-05-19 LAB — BASIC METABOLIC PANEL
Anion gap: 6 (ref 5–15)
BUN: 40 mg/dL — ABNORMAL HIGH (ref 8–23)
CO2: 26 mmol/L (ref 22–32)
Calcium: 7.5 mg/dL — ABNORMAL LOW (ref 8.9–10.3)
Chloride: 108 mmol/L (ref 98–111)
Creatinine, Ser: 1.71 mg/dL — ABNORMAL HIGH (ref 0.44–1.00)
GFR, Estimated: 28 mL/min — ABNORMAL LOW (ref >60)
Glucose, Bld: 93 mg/dL (ref 70–99)
Potassium: 3.6 mmol/L (ref 3.5–5.1)
Sodium: 140 mmol/L (ref 135–145)

## 2020-05-19 LAB — LIPID PANEL
Cholesterol: 77 mg/dL (ref 0–200)
HDL: 29 mg/dL — ABNORMAL LOW (ref >40)
LDL Cholesterol: 36 mg/dL (ref 0–99)
Total CHOL/HDL Ratio: 2.7 RATIO
Triglycerides: 62 mg/dL (ref <150)
VLDL: 12 mg/dL (ref 0–40)

## 2020-05-19 LAB — RESP PANEL BY RT-PCR (FLU A&B, COVID) ARPGX2
Influenza A by PCR: NEGATIVE
Influenza B by PCR: NEGATIVE
SARS Coronavirus 2 by RT PCR: POSITIVE — AB

## 2020-05-19 LAB — CBC
HCT: 25.3 % — ABNORMAL LOW (ref 36.0–46.0)
Hemoglobin: 8 g/dL — ABNORMAL LOW (ref 12.0–15.0)
MCH: 30.7 pg (ref 26.0–34.0)
MCHC: 31.6 g/dL (ref 30.0–36.0)
MCV: 96.9 fL (ref 80.0–100.0)
Platelets: 237 10*3/uL (ref 150–400)
RBC: 2.61 MIL/uL — ABNORMAL LOW (ref 3.87–5.11)
RDW: 15.7 % — ABNORMAL HIGH (ref 11.5–15.5)
WBC: 12.6 10*3/uL — ABNORMAL HIGH (ref 4.0–10.5)
nRBC: 0 % (ref 0.0–0.2)

## 2020-05-19 LAB — LACTIC ACID, PLASMA: Lactic Acid, Venous: 0.9 mmol/L (ref 0.5–1.9)

## 2020-05-19 LAB — PROCALCITONIN: Procalcitonin: 14.8 ng/mL

## 2020-05-19 LAB — HEPARIN LEVEL (UNFRACTIONATED)
Heparin Unfractionated: 0.29 IU/mL — ABNORMAL LOW (ref 0.30–0.70)
Heparin Unfractionated: 0.2 IU/mL — ABNORMAL LOW (ref 0.30–0.70)

## 2020-05-19 LAB — HEMOGLOBIN A1C
Hgb A1c MFr Bld: 5.3 % (ref 4.8–5.6)
Mean Plasma Glucose: 105.41 mg/dL

## 2020-05-19 LAB — URINE CULTURE: Culture: NO GROWTH

## 2020-05-19 MED ORDER — HEPARIN (PORCINE) 25000 UT/250ML-% IV SOLN
1350.0000 [IU]/h | INTRAVENOUS | Status: DC
  Administered 2020-05-19: 1050 [IU]/h via INTRAVENOUS
  Administered 2020-05-19: 1200 [IU]/h via INTRAVENOUS
  Filled 2020-05-19 (×2): qty 250

## 2020-05-19 MED ORDER — LACTATED RINGERS IV BOLUS
1000.0000 mL | Freq: Once | INTRAVENOUS | Status: AC
  Administered 2020-05-19: 1000 mL via INTRAVENOUS

## 2020-05-19 MED ORDER — SODIUM CHLORIDE 0.9 % IV BOLUS
1000.0000 mL | Freq: Once | INTRAVENOUS | Status: AC
  Administered 2020-05-19: 1000 mL via INTRAVENOUS

## 2020-05-19 MED ORDER — HEPARIN BOLUS VIA INFUSION
1000.0000 [IU] | Freq: Once | INTRAVENOUS | Status: AC
  Administered 2020-05-19: 1000 [IU] via INTRAVENOUS
  Filled 2020-05-19: qty 1000

## 2020-05-19 MED ORDER — HEPARIN BOLUS VIA INFUSION
2100.0000 [IU] | Freq: Once | INTRAVENOUS | Status: AC
  Administered 2020-05-19: 2100 [IU] via INTRAVENOUS
  Filled 2020-05-19: qty 2100

## 2020-05-19 NOTE — Progress Notes (Signed)
[place] for heparin infusion Indication: chest pain/ACS  Allergies  Allergen Reactions  . Atenolol Other (See Comments)    Other reaction(s): Other (See Comments) Decreased heart rate Decreased heart rate  . Codeine Shortness Of Breath    Rash, difficulty breathing, nausea.  . Losartan Other (See Comments)    Hyperkalemia  . Morphine And Related Shortness Of Breath    Rash, difficulty breathing, nausea.   . Fentanyl Other (See Comments)    hallucinations  . Nsaids Other (See Comments)    Other reaction(s): Unknown  . Rofecoxib Other (See Comments)    Other reaction(s): Unknown  . Sucralfate Other (See Comments)    Throat tightness  . Sulfa Antibiotics Other (See Comments)    Other reaction(s): Unknown  . Tramadol Other (See Comments)    lethargy    Patient Measurements: Height: 5\' 2"  (157.5 cm) Weight: 86.2 kg (190 lb) IBW/kg (Calculated) : 50.1 Heparin Dosing Weight: 70 kg  Vital Signs: Temp: 97.9 F (36.6 C) (03/25 1952) Temp Source: Oral (03/25 1537) BP: 133/105 (03/25 2152) Pulse Rate: 71 (03/25 2152)  Labs: Recent Labs    05/18/20 0900 05/18/20 1110 05/18/20 1548 05/18/20 1606 05/18/20 1937 05/18/20 2200  HGB 10.2*  --   --   --   --   --   HCT 33.6*  --   --   --   --   --   PLT 260  --   --   --   --   --   APTT 31  --   --   --   --   --   LABPROT 14.4  --   --   --   --   --   INR 1.2  --   --   --   --   --   HEPARINUNFRC  --   --   --   --   --  <0.10*  CREATININE 1.66*  --   --   --   --   --   TROPONINIHS 638*   < > 1,163* 1,196* 814*  --    < > = values in this interval not displayed.    Estimated Creatinine Clearance: 22.5 mL/min (A) (by C-G formula based on SCr of 1.66 mg/dL (H)).   Medical History: Past Medical History:  Diagnosis Date  . Bladder incontinence   . CAD (coronary artery disease)    a. 05/2009 Cath: LAD 90p (Xience 2.75 x 12 mm DES), 74m, D1 40, LCx 40p/m, RCA 30/40/30p/m, RCA  30/25d;  b.  12/2011 Lexiscan MV: no ischemia, breast attenuation artifact, normal EF-->Low risk; c. 10/2016 MV: fixed apical defect, most likely apical thinning and attenuation, No ischemia, EF 60%.  . Cancer ([place])    ovarian  . Carotid arterial disease w/ R Carotid Bruit (HCC)    a. 08/2016 Carotid U/S: 40-59% bilat ICA stenosis - f/u 1 yr.  . Chronic diastolic (congestive) heart failure ([place])    a. Echo 09/2015: EF 55-60% w/ Grade 1 DD, sev Ca2+ MV annulus, mildly dil LA; b. 09/2016 Echo: EF 65-70%, Gr2 DD, mildly dil LA/RA, nl RV fxn.  . Chronic Dyspnea on exertion   . CKD (chronic kidney disease) stage 3, GFR 30-59 ml/min (HCC)   . Degenerative arthritis of knee    bilateral knees  . Diabetes mellitus    Type II  . GIB (gastrointestinal bleeding)    a. 08/2016 Admission w/ presyncope/anemia/melena-->req Transfusion-->endo  ok, colonoscopy w/ polyps but no source of bleeding (most likely diverticular).  . Hiatal hernia   . Hypertension   . Iron deficiency   . Menopausal symptoms   . Morbid obesity ([place])   . Renal insufficiency   . Thyroid disease    hypothyroidism    Assessment: 87 y.o. female with coronary disease, congestive heart failure, CKD who comes in for abdominal pain, shortness of breath and fever. hs troponin 638-->933. Her Med Rec has been completed and she is noted to be on clopidogrel prior to admission.  Goal of Therapy:  Heparin level 0.3-0.7 units/ml Monitor platelets by anticoagulation protocol: Yes   3/25 2200 HL <0.1, subtherapeutic   Plan:  Give 2100 unit bolus x 1 Increase heparin infusion to 1050 Recheck HL in 8 hours after dose change. CBC daily while on Heparin.  Renda Rolls, PharmD, [facility] 05/19/2020 12:24 AM

## 2020-05-19 NOTE — Progress Notes (Signed)
Progress Note  Patient Name: Amy [name] Date of Encounter: 05/19/2020  [facility] Cardiologist: Ida Rogue, MD   Subjective   87 yo with hx of CAD, LAD stenting in Dec. 2011, CKD, renal artery stenosis, PAF ( not on [place] due to anemia )   Admitted with dyspnea,  Pneumonia  Had covid infection in January,   Tested + in Feb and March  troponins were found to have elevated  1196, 814 procalcitonin = 14. 8 Wbc = 12.6 K  ECG shows sinus tach, RBBB , no acute changes from previous except for tachycardia which is likely due to her pneumonia       Inpatient Medications    Scheduled Meds: . amitriptyline  10 mg Oral QHS  . clopidogrel  75 mg Oral Daily  . ipratropium-albuterol  3 mL Nebulization TID  . iron polysaccharides  150 mg Oral Daily  . levETIRAcetam  250 mg Oral BID  . levothyroxine  88 mcg Oral Q0600  . mometasone-formoterol  2 puff Inhalation BID  . montelukast  10 mg Oral QHS  . pantoprazole  20 mg Oral Daily  . pregabalin  100 mg Oral QHS  . pregabalin  50 mg Oral Daily  . simvastatin  40 mg Oral QHS  . sodium chloride flush  10-40 mL Intracatheter Q12H   Continuous Infusions: . ceFEPime (MAXIPIME) IV 2 g (05/19/20 1030)  . heparin 1,200 Units/hr (05/19/20 1033)   PRN Meds: acetaminophen, albuterol, dextromethorphan-guaiFENesin, docusate sodium, LORazepam, melatonin, nitroGLYCERIN, ondansetron (ZOFRAN) IV, polyethylene glycol, sodium chloride flush, traMADol   Vital Signs    Vitals:   05/19/20 0045 05/19/20 0343 05/19/20 0822 05/19/20 0829  BP: (!) 89/71 (!) 97/40 (!) 182/140 125/82  Pulse: 89 67 (!) 109   Resp: 18 18 18    Temp:  97.8 F (36.6 C) 98.5 F (36.9 C)   TempSrc:   Oral   SpO2: 97% 99% 95% 95%  Weight:      Height:        Intake/Output Summary (Last 24 hours) at 05/19/2020 1047 Last data filed at 05/19/2020 0656 Gross per 24 hour  Intake 488.32 ml  Output 0 ml  Net 488.32 ml   Last 3 Weights 05/18/2020 05/01/2020  04/23/2020  Weight (lbs) 190 lb 175 lb 179 lb 10.8 oz  Weight (kg) 86.183 kg 79.379 kg 81.5 kg  Some encounter information is confidential and restricted. Go to Review Flowsheets activity to see all data.      Telemetry    NSR  - Personally Reviewed  ECG     nsr  - Personally Reviewed  Physical Exam   GEN: elderly female,  Appears very weak , lying in bed  Neck: No JVD Cardiac: RRR, no murmurs, rubs, or gallops.  Respiratory: coarse rhonchi , especially with cough  GI: Soft, nontender, non-distended  MS: No edema; No deformity. Neuro:  Nonfocal  Psych: Normal affect   Labs    High Sensitivity Troponin:   Recent Labs  Lab 05/18/20 0900 05/18/20 1110 05/18/20 1548 05/18/20 1606 05/18/20 1937  TROPONINIHS 638* 933* 1,163* 1,196* 814*      Chemistry Recent Labs  Lab 05/18/20 0900 05/19/20 0731  NA 142 140  K 4.6 3.6  CL 108 108  CO2 25 26  GLUCOSE 119* 93  BUN 34* 40*  CREATININE 1.66* 1.71*  CALCIUM 7.7* 7.5*  PROT 5.6*  --   ALBUMIN 3.0*  --   AST 19  --   ALT  8  --   ALKPHOS 80  --   BILITOT 0.9  --   GFRNONAA 29* 28*  ANIONGAP 9 6     Hematology Recent Labs  Lab 05/18/20 0900 05/19/20 0731  WBC 4.7 12.6*  RBC 3.42* 2.61*  HGB 10.2* 8.0*  HCT 33.6* 25.3*  MCV 98.2 96.9  MCH 29.8 30.7  MCHC 30.4 31.6  RDW 15.6* 15.7*  PLT 260 237    BNP Recent Labs  Lab 05/18/20 1110  BNP 748.3*     DDimer No results for input(s): DDIMER in the last 168 hours.   Radiology    DG Chest Port 1 View  Result Date: 05/18/2020 CLINICAL DATA:  Shortness of breath and fever EXAM: PORTABLE CHEST 1 VIEW COMPARISON:  March 06, 2020 FINDINGS: There is extensive airspace opacity throughout most of the left lung. The right lung is clear. There is cardiomegaly with mild pulmonary venous hypertension. No adenopathy. There is aortic atherosclerosis. There is calcification in each carotid artery. No bone lesions. IMPRESSION: Widespread airspace opacity throughout  much of the left lung consistent with multifocal pneumonia. Right lung clear. There is cardiomegaly with a degree of pulmonary vascular congestion. There is calcification in each carotid artery. Aortic Atherosclerosis (ICD10-I70.0). Electronically Signed   By: Lowella Grip III M.D.   On: 05/18/2020 09:08   CT CHEST ABDOMEN PELVIS WO CONTRAST  Result Date: 05/18/2020 CLINICAL DATA:  Chest pain or shortness of breath. Pleural effusion or pleurisy suspected EXAM: CT CHEST, ABDOMEN AND PELVIS WITHOUT CONTRAST TECHNIQUE: Multidetector CT imaging of the chest, abdomen and pelvis was performed following the standard protocol without IV contrast. COMPARISON:  01/13/2017 FINDINGS: CT CHEST FINDINGS Cardiovascular: Normal heart size. No pericardial effusion. Bulky atherosclerotic calcification of the aorta and great vessels. Coronary atherosclerosis. Mediastinum/Nodes: No adenopathy or mass. Moderate sliding hiatal hernia. Lungs/Pleura: Dense consolidation throughout the left lung with poor visualization of central airways. Small left pleural effusion. Musculoskeletal: Nonacute right humeral neck fracture with marked anterior displacement that is similar to initial diagnosis February 2022 by radiography. Extensive spondylosis and thoracic spine ankylosis. Multilevel degenerative facet spurring especially at the open T6-7 level where there is biforaminal impingement. CT ABDOMEN PELVIS FINDINGS Hepatobiliary: No focal liver abnormality.No evidence of biliary obstruction or stone. Pancreas: Unremarkable. Spleen: Unremarkable. Adrenals/Urinary Tract: Negative adrenals. No hydronephrosis or stone. Left upper pole renal lesion which is a cyst by ultrasound in 2020. Peripherally calcified structure in the left upper pole which was also described on prior. Unremarkable bladder. Stomach/Bowel: No obstruction. No visible bowel inflammation. Numerous distal colonic diverticula. Distal colonic stool with moderate distension of  the rectum. No superimposed rectal wall thickening. Vascular/Lymphatic: No acute vascular abnormality. No mass or adenopathy. Reproductive:Hysterectomy Other: No ascites or pneumoperitoneum. Musculoskeletal: No acute abnormalities. Severe lumbar spine degeneration which is generalized. Generalized osteopenia. Hip osteoarthritis that is particularly advanced on the right. IMPRESSION: 1. Extensive left-sided pneumonia opacifying most of the left lung. Small left parapneumonic effusion. 2. No acute intra-abdominal finding. 3. Numerous chronic/known incidental findings which are described above. Electronically Signed   By: Monte Fantasia M.D.   On: 05/18/2020 11:21    Cardiac Studies      Patient Profile     87 y.o. female  With hx of CAD  Admitted with pneumonia and is found to have + troponins   Assessment & Plan    NSTEMI:    She has no angina.  Admitted with a significant left sided pneumonia and progressive dyspnea This is likely  demand ischemia   She is quite weak and is not a candidate for ischemic work up at this point . She has become weaker over the past year and spends much of her time in the bed or wheelchair.   At this point , I would continue IV heparin for 48 hours. She is not having any angina and I would not pursue cardiac cath or myoview at this time. She would need to be much stronger / healthier before we could consider PCI   2.  HTN:    BP has been low - likely due to sepsis from pneumonia Lisinopril is on hold   3.  pneumona :   CT shows a very extensive pneumonia.   She chokes when she eats - concerning for aspiration although her pneumonia is on the left side.   cont abx for now              For questions or updates, please contact [place] Please consult [email or web address] for contact info under        Signed, Mertie Moores, MD  05/19/2020, 10:47 AM

## 2020-05-19 NOTE — Plan of Care (Signed)

## 2020-05-19 NOTE — Progress Notes (Signed)
PROGRESS NOTE    Amy [name]  OMV:[id number] DOB: [birth year] DOA: 05/18/2020 PCP: Alvester Morin, MD   Assessment & Plan:   Principal Problem:   HCAP (healthcare-associated pneumonia) Active Problems:   Acquired hypothyroidism   HLD (hyperlipidemia)   Essential hypertension   Iron deficiency anemia   Chronic diastolic CHF (congestive heart failure) (HCC)   COPD with chronic bronchitis (HCC)   Severe sepsis (HCC)   CKD (chronic kidney disease) stage 4, GFR 15-29 ml/min (HCC)   HTN (hypertension)   GERD (gastroesophageal reflux disease)   CAD (coronary artery disease)   Atrial fibrillation (HCC)   Seizure (HCC)   Elevated troponin   Abdominal pain   Diarrhea   Acute respiratory failure with hypoxia (HCC)  Severe sepsis: secondary to HCAP. Meets critieria w/ tachycardia, tachypnea, elevated lactic acid. Had a Covid infection 03/06/2020, COVID19 PCR still positive. No longer needs isolation. Will not treat COVID19. Continue on IV cefepime and will d/c vanco. Continue on bronchodilators and encourage incentive spirometry.   HCAP: continue on IV cefepime and d/c vanco. Continue on bronchodilators and encourage incentive spirometry. Legionella, strep are pending.   NSTEMI: troponins >1,000 but trending down. Continue on IV heparin. Not a candidate for ischemic work up currently as per cardio. Continue on tele. Cardio following and recs apprec  Hypothyroidism: continue on home dose of levothyroxine   HLD: continue on statin   HTN: continue to hold all BP meds secondary to severe sepsis   IDA: continue on iron supplement   Chronic diastolic CHF: echo on 62/83/6629 showed EF of 55 to 60%, grade II diastolic dysfunction. Appears euvolemic   COPD: w/o exacerbation. Continue on bronchodilators and encourage incentive spirometry   CKDIV: Cr is trending up from day prior. Avoid nephrotoxic meds   Leukocytosis: likely secondary to HCAP. Continue on IV  abxs  GERD: continue on PPI   CAD: continue on plavix, zocor, & IV heparin   A. fib: likely PAF. Not on anticoagulation, possible due to high fall risk   Seizures: continue on home dose of keppra. IV ativan prn   Abdominal pain and diarrhea: GI PCR panel & c. diff ordered     DVT prophylaxis: IV heparin  Code Status: DNR Family Communication: discussed pt's care w/ pt's daughter, [name] and and answered her questions  Disposition Plan:depends on PT/OT recs (not consulted yet)  Level of care: Progressive Cardiac   Status is: Inpatient  Remains inpatient appropriate because:Unsafe d/c plan, IV treatments appropriate due to intensity of illness or inability to take PO and Inpatient level of care appropriate due to severity of illness   Dispo: The patient is from: Home              Anticipated d/c is to: Home              Patient currently is not medically stable to d/c.   Difficult to place patient Yes     Consultants:   Cardio    Procedures:  Antimicrobials:   Subjective: Pt c/o weakness  Objective: Vitals:   05/19/20 0045 05/19/20 0343 05/19/20 0822 05/19/20 0829  BP: (!) 89/71 (!) 97/40 (!) 182/140 125/82  Pulse: 89 67 (!) 109   Resp: 18 18 18    Temp:  97.8 F (36.6 C) 98.5 F (36.9 C)   TempSrc:   Oral   SpO2: 97% 99% 95% 95%  Weight:      Height:        Intake/Output Summary (Last  24 hours) at 05/19/2020 1113 Last data filed at 05/19/2020 0656 Gross per 24 hour  Intake 488.32 ml  Output 0 ml  Net 488.32 ml   Filed Weights   05/18/20 0853  Weight: 86.2 kg    Examination:  General exam: Appears calm and comfortable  Respiratory system: diminished breath sounds b/l Cardiovascular system: S1 & S2 +. No rubs, gallops or clicks.  Gastrointestinal system: Abdomen is nondistended, soft and nontender. Normal bowel sounds heard. Central nervous system: Alert and oriented.  Psychiatry: Judgement and insight appear normal. Flat mood and  affect    Data Reviewed: I have personally reviewed following labs and imaging studies  CBC: Recent Labs  Lab 05/18/20 0900 05/19/20 0731  WBC 4.7 12.6*  NEUTROABS 3.7  --   HGB 10.2* 8.0*  HCT 33.6* 25.3*  MCV 98.2 96.9  PLT 260 644   Basic Metabolic Panel: Recent Labs  Lab 05/18/20 0900 05/19/20 0731  NA 142 140  K 4.6 3.6  CL 108 108  CO2 25 26  GLUCOSE 119* 93  BUN 34* 40*  CREATININE 1.66* 1.71*  CALCIUM 7.7* 7.5*   GFR: Estimated Creatinine Clearance: 21.8 mL/min (A) (by C-G formula based on SCr of 1.71 mg/dL (H)). Liver Function Tests: Recent Labs  Lab 05/18/20 0900  AST 19  ALT 8  ALKPHOS 80  BILITOT 0.9  PROT 5.6*  ALBUMIN 3.0*   No results for input(s): LIPASE, AMYLASE in the last 168 hours. No results for input(s): AMMONIA in the last 168 hours. Coagulation Profile: Recent Labs  Lab 05/18/20 0900  INR 1.2   Cardiac Enzymes: No results for input(s): CKTOTAL, CKMB, CKMBINDEX, TROPONINI in the last 168 hours. BNP (last 3 results) No results for input(s): PROBNP in the last 8760 hours. HbA1C: No results for input(s): HGBA1C in the last 72 hours. CBG: Recent Labs  Lab 05/18/20 2154  GLUCAP 125*   Lipid Profile: Recent Labs    05/19/20 0731  CHOL 77  HDL 29*  LDLCALC 36  TRIG 62  CHOLHDL 2.7   Thyroid Function Tests: No results for input(s): TSH, T4TOTAL, FREET4, T3FREE, THYROIDAB in the last 72 hours. Anemia Panel: No results for input(s): VITAMINB12, FOLATE, FERRITIN, TIBC, IRON, RETICCTPCT in the last 72 hours. Sepsis Labs: Recent Labs  Lab 05/18/20 0900 05/18/20 1110 05/19/20 0731  PROCALCITON 5.26  --  14.80  LATICACIDVEN 2.9* 1.5  --     Recent Results (from the past 240 hour(s))  Blood Culture (routine x 2)     Status: None (Preliminary result)   Collection Time: 05/18/20  9:00 AM   Specimen: BLOOD  Result Value Ref Range Status   Specimen Description BLOOD LEFT ANTECUBITAL  Final   Special Requests   Final     BOTTLES DRAWN AEROBIC AND ANAEROBIC Blood Culture results may not be optimal due to an inadequate volume of blood received in culture bottles   Culture   Final    NO GROWTH < 24 HOURS Performed at [facility], [address], [place], [place] [address]    Report Status PENDING  Incomplete  Blood Culture (routine x 2)     Status: None (Preliminary result)   Collection Time: 05/18/20  9:00 AM   Specimen: BLOOD  Result Value Ref Range Status   Specimen Description BLOOD BLOOD LEFT HAND  Final   Special Requests   Final    BOTTLES DRAWN AEROBIC AND ANAEROBIC Blood Culture results may not be optimal due to an  inadequate volume of blood received in culture bottles   Culture   Final    NO GROWTH < 24 HOURS Performed at [facility], [address] [place], [place] [address]    Report Status PENDING  Incomplete  Resp Panel by RT-PCR (Flu A&B, Covid) Nasopharyngeal Swab     Status: Abnormal   Collection Time: 05/18/20 10:04 AM   Specimen: Nasopharyngeal Swab; Nasopharyngeal(NP) swabs in vial transport medium  Result Value Ref Range Status   SARS Coronavirus 2 by RT PCR POSITIVE (A) NEGATIVE Corrected    Comment: RESULT CALLED TO, READ BACK BY AND VERIFIED WITH:  TAYLOR LEWIS AT 1115 05/18/20 SDR (NOTE) SARS-CoV-2 target nucleic acids are DETECTED.  The SARS-CoV-2 RNA is generally detectable in upper respiratory specimens during the acute phase of infection. Positive results are indicative of the presence of the identified virus, but do not rule out bacterial infection or co-infection with other pathogens not detected by the test. Clinical correlation with patient history and other diagnostic information is necessary to determine patient infection status. The expected result is Negative.  Fact Sheet for Patients: EntrepreneurPulse.com.au  Fact Sheet for Healthcare Providers: IncredibleEmployment.be  This test is not yet approved  or cleared by the [place] FDA and  has been authorized for detection and/or diagnosis of SARS-CoV-2 by FDA under an Emergency Use Authorization (EUA).  This EUA will remain in effect (meaning this test can be  used) for the duration of  the COVID-19 declaration under Section 564(b)(1) of the Act, 21 U.S.C. section 360bbb-3(b)(1), unless the authorization is terminated or revoked sooner.  CORRECTED ON 03/26 AT 3151: PREVIOUSLY REPORTED AS POSITIVE RESULT CALLED TO, READ BACK BY AND VERIFIED WITH:  Lovena Le LEWIS AT 7616 04/20/20 SDR    Influenza A by PCR NEGATIVE NEGATIVE Final   Influenza B by PCR NEGATIVE NEGATIVE Final    Comment: (NOTE) The Xpert Xpress SARS-CoV-2/FLU/RSV plus assay is intended as an aid in the diagnosis of influenza from Nasopharyngeal swab specimens and should not be used as a sole basis for treatment. Nasal washings and aspirates are unacceptable for Xpert Xpress SARS-CoV-2/FLU/RSV testing.  Fact Sheet for Patients: EntrepreneurPulse.com.au  Fact Sheet for Healthcare Providers: IncredibleEmployment.be  This test is not yet approved or cleared by the [place] FDA and has been authorized for detection and/or diagnosis of SARS-CoV-2 by FDA under an Emergency Use Authorization (EUA). This EUA will remain in effect (meaning this test can be used) for the duration of the COVID-19 declaration under Section 564(b)(1) of the Act, 21 U.S.C. section 360bbb-3(b)(1), unless the authorization is terminated or revoked.  Performed at [facility], [address] [place], [place] [address]   MRSA PCR Screening     Status: None   Collection Time: 05/18/20  3:48 PM   Specimen: Nasal Mucosa; Nasopharyngeal  Result Value Ref Range Status   MRSA by PCR NEGATIVE NEGATIVE Final    Comment:        The GeneXpert MRSA Assay (FDA approved for NASAL specimens only), is one component of a comprehensive MRSA  colonization surveillance program. It is not intended to diagnose MRSA infection nor to guide or monitor treatment for MRSA infections. Performed at [facility], [address], [place], [place] [address]          Radiology Studies: [facility] Chest [facility] 1 View  Result Date: 05/18/2020 CLINICAL DATA:  Shortness of breath and fever EXAM: PORTABLE CHEST 1 VIEW COMPARISON:  March 06, 2020 FINDINGS: There  is extensive airspace opacity throughout most of the left lung. The right lung is clear. There is cardiomegaly with mild pulmonary venous hypertension. No adenopathy. There is aortic atherosclerosis. There is calcification in each carotid artery. No bone lesions. IMPRESSION: Widespread airspace opacity throughout much of the left lung consistent with multifocal pneumonia. Right lung clear. There is cardiomegaly with a degree of pulmonary vascular congestion. There is calcification in each carotid artery. Aortic Atherosclerosis (ICD10-I70.0). Electronically Signed   By: Lowella Grip III M.D.   On: 05/18/2020 09:08   CT CHEST ABDOMEN PELVIS WO CONTRAST  Result Date: 05/18/2020 CLINICAL DATA:  Chest pain or shortness of breath. Pleural effusion or pleurisy suspected EXAM: CT CHEST, ABDOMEN AND PELVIS WITHOUT CONTRAST TECHNIQUE: Multidetector CT imaging of the chest, abdomen and pelvis was performed following the standard protocol without IV contrast. COMPARISON:  01/13/2017 FINDINGS: CT CHEST FINDINGS Cardiovascular: Normal heart size. No pericardial effusion. Bulky atherosclerotic calcification of the aorta and great vessels. Coronary atherosclerosis. Mediastinum/Nodes: No adenopathy or mass. Moderate sliding hiatal hernia. Lungs/Pleura: Dense consolidation throughout the left lung with poor visualization of central airways. Small left pleural effusion. Musculoskeletal: Nonacute right humeral neck fracture with marked anterior displacement that is similar to initial diagnosis February 2022  by radiography. Extensive spondylosis and thoracic spine ankylosis. Multilevel degenerative facet spurring especially at the open T6-7 level where there is biforaminal impingement. CT ABDOMEN PELVIS FINDINGS Hepatobiliary: No focal liver abnormality.No evidence of biliary obstruction or stone. Pancreas: Unremarkable. Spleen: Unremarkable. Adrenals/Urinary Tract: Negative adrenals. No hydronephrosis or stone. Left upper pole renal lesion which is a cyst by ultrasound in 2020. Peripherally calcified structure in the left upper pole which was also described on prior. Unremarkable bladder. Stomach/Bowel: No obstruction. No visible bowel inflammation. Numerous distal colonic diverticula. Distal colonic stool with moderate distension of the rectum. No superimposed rectal wall thickening. Vascular/Lymphatic: No acute vascular abnormality. No mass or adenopathy. Reproductive:Hysterectomy Other: No ascites or pneumoperitoneum. Musculoskeletal: No acute abnormalities. Severe lumbar spine degeneration which is generalized. Generalized osteopenia. Hip osteoarthritis that is particularly advanced on the right. IMPRESSION: 1. Extensive left-sided pneumonia opacifying most of the left lung. Small left parapneumonic effusion. 2. No acute intra-abdominal finding. 3. Numerous chronic/known incidental findings which are described above. Electronically Signed   By: Monte Fantasia M.D.   On: 05/18/2020 11:21        Scheduled Meds: . amitriptyline  10 mg Oral QHS  . clopidogrel  75 mg Oral Daily  . ipratropium-albuterol  3 mL Nebulization TID  . iron polysaccharides  150 mg Oral Daily  . levETIRAcetam  250 mg Oral BID  . levothyroxine  88 mcg Oral Q0600  . mometasone-formoterol  2 puff Inhalation BID  . montelukast  10 mg Oral QHS  . pantoprazole  20 mg Oral Daily  . pregabalin  100 mg Oral QHS  . pregabalin  50 mg Oral Daily  . simvastatin  40 mg Oral QHS  . sodium chloride flush  10-40 mL Intracatheter Q12H    Continuous Infusions: . ceFEPime (MAXIPIME) IV 2 g (05/19/20 1030)  . heparin 1,200 Units/hr (05/19/20 1033)     LOS: 1 day    Time spent: 33 mins     Wyvonnia Dusky, MD [facility] Pager [phone]  If 7PM-7AM, please contact night-coverage 05/19/2020, 11:13 AM

## 2020-05-19 NOTE — Progress Notes (Signed)
ANTICOAGULATION CONSULT NOTE  Pharmacy Consult for heparin infusion Indication: chest pain/ACS  Patient Measurements: Height: 5\' 2"  (157.5 cm) Weight: 86.2 kg (190 lb) IBW/kg (Calculated) : 50.1 Heparin Dosing Weight: 70 kg  Vital Signs: Temp: 98.5 F (36.9 C) (03/26 0822) Temp Source: Oral (03/26 0822) BP: 125/82 (03/26 0829) Pulse Rate: 109 (03/26 0822)  Labs: Recent Labs    05/18/20 0900 05/18/20 1110 05/18/20 1548 05/18/20 1606 05/18/20 1937 05/18/20 2200 05/19/20 0731  HGB 10.2*  --   --   --   --   --  8.0*  HCT 33.6*  --   --   --   --   --  25.3*  PLT 260  --   --   --   --   --  237  APTT 31  --   --   --   --   --   --   LABPROT 14.4  --   --   --   --   --   --   INR 1.2  --   --   --   --   --   --   HEPARINUNFRC  --   --   --   --   --  <0.10* 0.29*  CREATININE 1.66*  --   --   --   --   --  1.71*  TROPONINIHS 638*   < > 1,163* 1,196* 814*  --   --    < > = values in this interval not displayed.    Estimated Creatinine Clearance: 21.8 mL/min (A) (by C-G formula based on SCr of 1.71 mg/dL (H)).   Medical History: Past Medical History:  Diagnosis Date  . Bladder incontinence   . CAD (coronary artery disease)    a. 05/2009 Cath: LAD 90p (Xience 2.75 x 12 mm DES), 34m, D1 40, LCx 40p/m, RCA 30/40/30p/m, RCA 30/25d;  b.  12/2011 Lexiscan MV: no ischemia, breast attenuation artifact, normal EF-->Low risk; c. 10/2016 MV: fixed apical defect, most likely apical thinning and attenuation, No ischemia, EF 60%.  . Cancer ([place])    ovarian  . Carotid arterial disease w/ R Carotid Bruit (HCC)    a. 08/2016 Carotid U/S: 40-59% bilat ICA stenosis - f/u 1 yr.  . Chronic diastolic (congestive) heart failure ([place])    a. Echo 09/2015: EF 55-60% w/ Grade 1 DD, sev Ca2+ MV annulus, mildly dil LA; b. 09/2016 Echo: EF 65-70%, Gr2 DD, mildly dil LA/RA, nl RV fxn.  . Chronic Dyspnea on exertion   . CKD (chronic kidney disease) stage 3, GFR 30-59 ml/min (HCC)   . Degenerative  arthritis of knee    bilateral knees  . Diabetes mellitus    Type II  . GIB (gastrointestinal bleeding)    a. 08/2016 Admission w/ presyncope/anemia/melena-->req Transfusion-->endo ok, colonoscopy w/ polyps but no source of bleeding (most likely diverticular).  . Hiatal hernia   . Hypertension   . Iron deficiency   . Menopausal symptoms   . Morbid obesity ([place])   . Renal insufficiency   . Thyroid disease    hypothyroidism    Assessment: 87 y.o. female with coronary disease, congestive heart failure, CKD who comes in for abdominal pain, shortness of breath and fever. Her Med Rec has been completed and she is noted to be on clopidogrel prior to admission. H&H trending down, platelets wnl  Goal of Therapy:  Heparin level 0.3-0.7 units/ml Monitor platelets by anticoagulation protocol: Yes   Plan:  Increase heparin infusion rate to 1200  Recheck anti-Xa level in 8 hours after rate change  CBC daily while on IV heparin  Vallery Sa, PharmD 05/19/2020 9:07 AM

## 2020-05-19 NOTE — Progress Notes (Signed)
Cross Cover Patient with extensive pneumonia and NSTEMI now with hypotension. Had received 1 liter of fluids earlier in day for hypotension and AKI with only mild improvement in pressures. WBC mildly elevated this am at 12.6. Procalcitonin significantly elevated from yesterday. 14.8/5.2 respectively. Lactic acid this am 1.5. Blood and urine cultures done 3/25.   Repeat fluid bolus  obtain repeat lactate Continue cefepime Monitor

## 2020-05-19 NOTE — Progress Notes (Signed)
ANTICOAGULATION CONSULT NOTE  Pharmacy Consult for heparin infusion Indication: chest pain/ACS  Patient Measurements: Height: 5\' 2"  (157.5 cm) Weight: 86.2 kg (190 lb) IBW/kg (Calculated) : 50.1 Heparin Dosing Weight: 70 kg  Vital Signs: Temp: 98.4 F (36.9 C) (03/26 1644) Temp Source: Oral (03/26 1209) BP: 93/41 (03/26 1644) Pulse Rate: 71 (03/26 1644)  Labs: Recent Labs    05/18/20 0900 05/18/20 1110 05/18/20 1548 05/18/20 1606 05/18/20 1937 05/18/20 2200 05/19/20 0731 05/19/20 1746  HGB 10.2*  --   --   --   --   --  8.0*  --   HCT 33.6*  --   --   --   --   --  25.3*  --   PLT 260  --   --   --   --   --  237  --   APTT 31  --   --   --   --   --   --   --   LABPROT 14.4  --   --   --   --   --   --   --   INR 1.2  --   --   --   --   --   --   --   HEPARINUNFRC  --   --   --   --   --  <0.10* 0.29* 0.20*  CREATININE 1.66*  --   --   --   --   --  1.71*  --   TROPONINIHS 638*   < > 1,163* 1,196* 814*  --   --   --    < > = values in this interval not displayed.    Estimated Creatinine Clearance: 21.8 mL/min (A) (by C-G formula based on SCr of 1.71 mg/dL (H)).   Medical History: Past Medical History:  Diagnosis Date  . Bladder incontinence   . CAD (coronary artery disease)    a. 05/2009 Cath: LAD 90p (Xience 2.75 x 12 mm DES), 61m, D1 40, LCx 40p/m, RCA 30/40/30p/m, RCA 30/25d;  b.  12/2011 Lexiscan MV: no ischemia, breast attenuation artifact, normal EF-->Low risk; c. 10/2016 MV: fixed apical defect, most likely apical thinning and attenuation, No ischemia, EF 60%.  . Cancer ([place])    ovarian  . Carotid arterial disease w/ R Carotid Bruit (HCC)    a. 08/2016 Carotid U/S: 40-59% bilat ICA stenosis - f/u 1 yr.  . Chronic diastolic (congestive) heart failure ([place])    a. Echo 09/2015: EF 55-60% w/ Grade 1 DD, sev Ca2+ MV annulus, mildly dil LA; b. 09/2016 Echo: EF 65-70%, Gr2 DD, mildly dil LA/RA, nl RV fxn.  . Chronic Dyspnea on exertion   . CKD (chronic kidney  disease) stage 3, GFR 30-59 ml/min (HCC)   . Degenerative arthritis of knee    bilateral knees  . Diabetes mellitus    Type II  . GIB (gastrointestinal bleeding)    a. 08/2016 Admission w/ presyncope/anemia/melena-->req Transfusion-->endo ok, colonoscopy w/ polyps but no source of bleeding (most likely diverticular).  . Hiatal hernia   . Hypertension   . Iron deficiency   . Menopausal symptoms   . Morbid obesity ([place])   . Renal insufficiency   . Thyroid disease    hypothyroidism    Assessment: 87 y.o. female with coronary disease, congestive heart failure, CKD who comes in for abdominal pain, shortness of breath and fever. Her Med Rec has been completed and she is noted to be on  clopidogrel prior to admission. H&H trending down, platelets wnl  Goal of Therapy:  Heparin level 0.3-0.7 units/ml Monitor platelets by anticoagulation protocol: Yes   Plan:   Heparin level subtherapeutic, will order 1000 unit bolus x 1 and increase heparin infusion rate to 1350 units/hr  Recheck anti-Xa level in 8 hours after rate change  CBC daily while on IV heparin  Darnelle Bos, PharmD 05/19/2020 8:19 PM

## 2020-05-20 DIAGNOSIS — R6520 Severe sepsis without septic shock: Secondary | ICD-10-CM | POA: Diagnosis not present

## 2020-05-20 DIAGNOSIS — A419 Sepsis, unspecified organism: Secondary | ICD-10-CM | POA: Diagnosis not present

## 2020-05-20 DIAGNOSIS — J189 Pneumonia, unspecified organism: Secondary | ICD-10-CM | POA: Diagnosis not present

## 2020-05-20 DIAGNOSIS — I214 Non-ST elevation (NSTEMI) myocardial infarction: Secondary | ICD-10-CM | POA: Diagnosis not present

## 2020-05-20 LAB — CBC
HCT: 22.6 % — ABNORMAL LOW (ref 36.0–46.0)
Hemoglobin: 7 g/dL — ABNORMAL LOW (ref 12.0–15.0)
MCH: 30.3 pg (ref 26.0–34.0)
MCHC: 31 g/dL (ref 30.0–36.0)
MCV: 97.8 fL (ref 80.0–100.0)
Platelets: 204 10*3/uL (ref 150–400)
RBC: 2.31 MIL/uL — ABNORMAL LOW (ref 3.87–5.11)
RDW: 15.7 % — ABNORMAL HIGH (ref 11.5–15.5)
WBC: 14.2 10*3/uL — ABNORMAL HIGH (ref 4.0–10.5)
nRBC: 0 % (ref 0.0–0.2)

## 2020-05-20 LAB — LACTIC ACID, PLASMA: Lactic Acid, Venous: 0.8 mmol/L (ref 0.5–1.9)

## 2020-05-20 LAB — MAGNESIUM: Magnesium: 1.4 mg/dL — ABNORMAL LOW (ref 1.7–2.4)

## 2020-05-20 LAB — HEPARIN LEVEL (UNFRACTIONATED)
Heparin Unfractionated: 0.25 IU/mL — ABNORMAL LOW (ref 0.30–0.70)
Heparin Unfractionated: 0.42 IU/mL (ref 0.30–0.70)
Heparin Unfractionated: 0.34 IU/mL (ref 0.30–0.70)

## 2020-05-20 LAB — BASIC METABOLIC PANEL
Anion gap: 7 (ref 5–15)
BUN: 42 mg/dL — ABNORMAL HIGH (ref 8–23)
CO2: 23 mmol/L (ref 22–32)
Calcium: 7.6 mg/dL — ABNORMAL LOW (ref 8.9–10.3)
Chloride: 107 mmol/L (ref 98–111)
Creatinine, Ser: 1.45 mg/dL — ABNORMAL HIGH (ref 0.44–1.00)
GFR, Estimated: 34 mL/min — ABNORMAL LOW (ref >60)
Glucose, Bld: 99 mg/dL (ref 70–99)
Potassium: 3.4 mmol/L — ABNORMAL LOW (ref 3.5–5.1)
Sodium: 137 mmol/L (ref 135–145)

## 2020-05-20 LAB — HEMOGLOBIN AND HEMATOCRIT, BLOOD
HCT: 25.1 % — ABNORMAL LOW (ref 36.0–46.0)
Hemoglobin: 7.6 g/dL — ABNORMAL LOW (ref 12.0–15.0)

## 2020-05-20 LAB — PHOSPHORUS: Phosphorus: 3.6 mg/dL (ref 2.5–4.6)

## 2020-05-20 LAB — PROCALCITONIN: Procalcitonin: 8.46 ng/mL

## 2020-05-20 MED ORDER — MAGNESIUM SULFATE 2 GM/50ML IV SOLN
2.0000 g | Freq: Once | INTRAVENOUS | Status: AC
  Administered 2020-05-20: 2 g via INTRAVENOUS
  Filled 2020-05-20: qty 50

## 2020-05-20 MED ORDER — HEPARIN (PORCINE) 25000 UT/250ML-% IV SOLN
1450.0000 [IU]/h | INTRAVENOUS | Status: DC
  Administered 2020-05-20 – 2020-05-21 (×3): 1450 [IU]/h via INTRAVENOUS
  Filled 2020-05-20 (×3): qty 250

## 2020-05-20 MED ORDER — IPRATROPIUM-ALBUTEROL 0.5-2.5 (3) MG/3ML IN SOLN
3.0000 mL | Freq: Two times a day (BID) | RESPIRATORY_TRACT | Status: DC
  Administered 2020-05-20 – 2020-06-02 (×23): 3 mL via RESPIRATORY_TRACT
  Filled 2020-05-20 (×24): qty 3

## 2020-05-20 MED ORDER — POTASSIUM CHLORIDE CRYS ER 20 MEQ PO TBCR
20.0000 meq | EXTENDED_RELEASE_TABLET | Freq: Once | ORAL | Status: AC
  Administered 2020-05-20: 20 meq via ORAL
  Filled 2020-05-20: qty 1

## 2020-05-20 MED ORDER — HEPARIN BOLUS VIA INFUSION
1000.0000 [IU] | Freq: Once | INTRAVENOUS | Status: AC
  Administered 2020-05-20: 1000 [IU] via INTRAVENOUS
  Filled 2020-05-20: qty 1000

## 2020-05-20 NOTE — Progress Notes (Signed)
PROGRESS NOTE    Amy [name]  ZCH:[id number] DOB: 1927/12/30 DOA: 05/18/2020 PCP: Alvester Morin, MD   Assessment & Plan:   Principal Problem:   HCAP (healthcare-associated pneumonia) Active Problems:   Acquired hypothyroidism   HLD (hyperlipidemia)   Essential hypertension   Iron deficiency anemia   Chronic diastolic CHF (congestive heart failure) (HCC)   COPD with chronic bronchitis (HCC)   Severe sepsis (HCC)   CKD (chronic kidney disease) stage 4, GFR 15-29 ml/min (HCC)   HTN (hypertension)   GERD (gastroesophageal reflux disease)   CAD (coronary artery disease)   Atrial fibrillation (HCC)   Seizure (HCC)   Elevated troponin   Abdominal pain   Diarrhea   Acute respiratory failure with hypoxia (HCC)  Severe sepsis: secondary to HCAP. Meets critieria w/ tachycardia, tachypnea, elevated lactic acid. Had a Covid infection 03/06/2020, COVID19 PCR still positive. No longer needs isolation. Will not treat COVID19. Continue on IV cefepime. Continue on bronchodilators and encourage incentive spirometry.   HCAP: continue on IV cefepime and d/c vanco. Continue on bronchodilators and encourage incentive spirometry. Legionella is pending still   NSTEMI: troponins >1,000 but trending down. W/ chest pain today. Nitro prn. Continue on IV heparin as per cardio. Currently not a candidate for invasive cardiac workup as per cardio. Continue on tele   Leukocytosis: likely secondary to infection. Continue on IV abxs  Hypothyroidism: continue on home dose of synthroid   HLD: continue on statin   HTN: continue to hold all anti-HTN secondary to severe sepsis  IDA: continue on Fe supplement. H&H are trending down. Repeat H&H ordered   Chronic diastolic CHF: echo on 28/78/6767 showed EF of 55 to 60%, grade II diastolic dysfunction. Appears euvolemic   COPD: w/o exacerbation. Continue on bronchodilators and encourage incentive spirometry   CKDIV: Cr is trending down  from day prior. Avoid nephrotoxic meds  Hypokalemia: KCl repleted. Will continue to monitor  Hypomagnesemia: mg sulfate ordered. Will continue to monitor   Leukocytosis: likely secondary to HCAP. Continue on IV abxs  GERD: continue on pantoprazole   CAD: continue on plavix & statin. Continue on tele   A. fib: likely PAF. Not on anticoagulation likely secondary to high fall risk   Seizures: continue on home dose of keppra. Seizure precautions   Abdominal pain and diarrhea: GI PCR panel & c. diff ordered but no more diarrhea so far      DVT prophylaxis: IV heparin  Code Status: DNR Family Communication: discussed pt's care w/ pt's daughter, [name] and and answered her questions  Disposition Plan:depends on PT/OT recs (not consulted yet)  Level of care: Progressive Cardiac   Status is: Inpatient  Remains inpatient appropriate because:Unsafe d/c plan, IV treatments appropriate due to intensity of illness or inability to take PO and Inpatient level of care appropriate due to severity of illness   Dispo: The patient is from: Home              Anticipated d/c is to: Home              Patient currently is not medically stable to d/c.   Difficult to place patient Yes     Consultants:   Cardio    Procedures:  Antimicrobials:   Subjective: Pt c/o shoulder pain   Objective: Vitals:   05/19/20 2319 05/20/20 0145 05/20/20 0344 05/20/20 0739  BP: (!) 123/43 (!) 112/50 (!) 102/41 105/60  Pulse: 70 62 62 (!) 59  Resp: 18 18 18  18  Temp: 97.6 F (36.4 C) 98.5 F (36.9 C) 98.3 F (36.8 C) 97.8 F (36.6 C)  TempSrc: Oral Oral  Oral  SpO2: 96% 97% 97% 100%  Weight:   83.5 kg   Height:        Intake/Output Summary (Last 24 hours) at 05/20/2020 0810 Last data filed at 05/19/2020 1855 Gross per 24 hour  Intake 1123.94 ml  Output 550 ml  Net 573.94 ml   Filed Weights   05/18/20 0853 05/20/20 0344  Weight: 86.2 kg 83.5 kg    Examination:  General exam:  Appears uncomfortable  Respiratory system: decreased breath sounds b/l. No wheezes  Cardiovascular system: S1/S2+. No rubs or gallops  Gastrointestinal system: Abd is soft, NT, obese and hypoactive bowel sounds  Central nervous system: Alert and oriented. Moves all extremities  Psychiatry: Judgement and insight appear normal. Flat mood and affect     Data Reviewed: I have personally reviewed following labs and imaging studies  CBC: Recent Labs  Lab 05/18/20 0900 05/19/20 0731 05/20/20 0025  WBC 4.7 12.6* 14.2*  NEUTROABS 3.7  --   --   HGB 10.2* 8.0* 7.0*  HCT 33.6* 25.3* 22.6*  MCV 98.2 96.9 97.8  PLT 260 237 979   Basic Metabolic Panel: Recent Labs  Lab 05/18/20 0900 05/19/20 0731 05/20/20 0025 05/20/20 0457  NA 142 140 137  --   K 4.6 3.6 3.4*  --   CL 108 108 107  --   CO2 25 26 23   --   GLUCOSE 119* 93 99  --   BUN 34* 40* 42*  --   CREATININE 1.66* 1.71* 1.45*  --   CALCIUM 7.7* 7.5* 7.6*  --   MG  --   --   --  1.4*  PHOS  --   --   --  3.6   GFR: Estimated Creatinine Clearance: 25.3 mL/min (A) (by C-G formula based on SCr of 1.45 mg/dL (H)). Liver Function Tests: Recent Labs  Lab 05/18/20 0900  AST 19  ALT 8  ALKPHOS 80  BILITOT 0.9  PROT 5.6*  ALBUMIN 3.0*   No results for input(s): LIPASE, AMYLASE in the last 168 hours. No results for input(s): AMMONIA in the last 168 hours. Coagulation Profile: Recent Labs  Lab 05/18/20 0900  INR 1.2   Cardiac Enzymes: No results for input(s): CKTOTAL, CKMB, CKMBINDEX, TROPONINI in the last 168 hours. BNP (last 3 results) No results for input(s): PROBNP in the last 8760 hours. HbA1C: Recent Labs    05/19/20 0731  HGBA1C 5.3   CBG: Recent Labs  Lab 05/18/20 2154  GLUCAP 125*   Lipid Profile: Recent Labs    05/19/20 0731  CHOL 77  HDL 29*  LDLCALC 36  TRIG 62  CHOLHDL 2.7   Thyroid Function Tests: No results for input(s): TSH, T4TOTAL, FREET4, T3FREE, THYROIDAB in the last 72  hours. Anemia Panel: No results for input(s): VITAMINB12, FOLATE, FERRITIN, TIBC, IRON, RETICCTPCT in the last 72 hours. Sepsis Labs: Recent Labs  Lab 05/18/20 0900 05/18/20 1110 05/19/20 0731 05/19/20 2136 05/20/20 0021 05/20/20 0025  PROCALCITON 5.26  --  14.80  --   --  8.46  LATICACIDVEN 2.9* 1.5  --  0.9 0.8  --     Recent Results (from the past 240 hour(s))  Blood Culture (routine x 2)     Status: None (Preliminary result)   Collection Time: 05/18/20  9:00 AM   Specimen: BLOOD  Result Value Ref Range Status  Specimen Description BLOOD LEFT ANTECUBITAL  Final   Special Requests   Final    BOTTLES DRAWN AEROBIC AND ANAEROBIC Blood Culture results may not be optimal due to an inadequate volume of blood received in culture bottles   Culture   Final    NO GROWTH 2 DAYS Performed at [facility], [address] [place], [place] [address]    Report Status PENDING  Incomplete  Blood Culture (routine x 2)     Status: None (Preliminary result)   Collection Time: 05/18/20  9:00 AM   Specimen: BLOOD  Result Value Ref Range Status   Specimen Description BLOOD BLOOD LEFT HAND  Final   Special Requests   Final    BOTTLES DRAWN AEROBIC AND ANAEROBIC Blood Culture results may not be optimal due to an inadequate volume of blood received in culture bottles   Culture   Final    NO GROWTH 2 DAYS Performed at [facility], [address], [place], [place] [address]    Report Status PENDING  Incomplete  Resp Panel by RT-PCR (Flu A&B, Covid) Nasopharyngeal Swab     Status: Abnormal   Collection Time: 05/18/20 10:04 AM   Specimen: Nasopharyngeal Swab; Nasopharyngeal(NP) swabs in vial transport medium  Result Value Ref Range Status   SARS Coronavirus 2 by RT PCR POSITIVE (A) NEGATIVE Corrected    Comment: RESULT CALLED TO, READ BACK BY AND VERIFIED WITH:  TAYLOR LEWIS AT 1115 05/18/20 SDR (NOTE) SARS-CoV-2 target nucleic acids are DETECTED.  The SARS-CoV-2 RNA is  generally detectable in upper respiratory specimens during the acute phase of infection. Positive results are indicative of the presence of the identified virus, but do not rule out bacterial infection or co-infection with other pathogens not detected by the test. Clinical correlation with patient history and other diagnostic information is necessary to determine patient infection status. The expected result is Negative.  Fact Sheet for Patients: EntrepreneurPulse.com.au  Fact Sheet for Healthcare Providers: IncredibleEmployment.be  This test is not yet approved or cleared by the [place] FDA and  has been authorized for detection and/or diagnosis of SARS-CoV-2 by FDA under an Emergency Use Authorization (EUA).  This EUA will remain in effect (meaning this test can be  used) for the duration of  the COVID-19 declaration under Section 564(b)(1) of the Act, 21 U.S.C. section 360bbb-3(b)(1), unless the authorization is terminated or revoked sooner.  CORRECTED ON 03/26 AT 4166: PREVIOUSLY REPORTED AS POSITIVE RESULT CALLED TO, READ BACK BY AND VERIFIED WITH:  Lovena Le LEWIS AT 0630 04/20/20 SDR    Influenza A by PCR NEGATIVE NEGATIVE Final   Influenza B by PCR NEGATIVE NEGATIVE Final    Comment: (NOTE) The Xpert Xpress SARS-CoV-2/FLU/RSV plus assay is intended as an aid in the diagnosis of influenza from Nasopharyngeal swab specimens and should not be used as a sole basis for treatment. Nasal washings and aspirates are unacceptable for Xpert Xpress SARS-CoV-2/FLU/RSV testing.  Fact Sheet for Patients: EntrepreneurPulse.com.au  Fact Sheet for Healthcare Providers: IncredibleEmployment.be  This test is not yet approved or cleared by the [place] FDA and has been authorized for detection and/or diagnosis of SARS-CoV-2 by FDA under an Emergency Use Authorization (EUA). This EUA will remain in effect  (meaning this test can be used) for the duration of the COVID-19 declaration under Section 564(b)(1) of the Act, 21 U.S.C. section 360bbb-3(b)(1), unless the authorization is terminated or revoked.  Performed at [facility], [address], [place], [place] [address]  Urine culture     Status: None   Collection Time: 05/18/20 11:45 AM   Specimen: Urine, Random  Result Value Ref Range Status   Specimen Description   Final    URINE, RANDOM Performed at [facility], [address] [place], [place] [address]    Special Requests   Final    NONE Performed at [facility], [address] [place], [place] [address]    Culture   Final    NO GROWTH Performed at [facility], [place] [address], [place],  [address]    Report Status 05/19/2020 FINAL  Final  MRSA PCR Screening     Status: None   Collection Time: 05/18/20  3:48 PM   Specimen: Nasal Mucosa; Nasopharyngeal  Result Value Ref Range Status   MRSA by PCR NEGATIVE NEGATIVE Final    Comment:        The GeneXpert MRSA Assay (FDA approved for NASAL specimens only), is one component of a comprehensive MRSA colonization surveillance program. It is not intended to diagnose MRSA infection nor to guide or monitor treatment for MRSA infections. Performed at [facility], [address], [place],  [address]          Radiology Studies: DG Chest [place] 1 View  Result Date: 05/18/2020 CLINICAL DATA:  Shortness of breath and fever EXAM: PORTABLE CHEST 1 VIEW COMPARISON:  March 06, 2020 FINDINGS: There is extensive airspace opacity throughout most of the left lung. The right lung is clear. There is cardiomegaly with mild pulmonary venous hypertension. No adenopathy. There is aortic atherosclerosis. There is calcification in each carotid artery. No bone lesions. IMPRESSION: Widespread airspace opacity throughout much of the left lung consistent with multifocal pneumonia.  Right lung clear. There is cardiomegaly with a degree of pulmonary vascular congestion. There is calcification in each carotid artery. Aortic Atherosclerosis (ICD10-I70.0). Electronically Signed   By: Lowella Grip III M.D.   On: 05/18/2020 09:08   CT CHEST ABDOMEN PELVIS WO CONTRAST  Result Date: 05/18/2020 CLINICAL DATA:  Chest pain or shortness of breath. Pleural effusion or pleurisy suspected EXAM: CT CHEST, ABDOMEN AND PELVIS WITHOUT CONTRAST TECHNIQUE: Multidetector CT imaging of the chest, abdomen and pelvis was performed following the standard protocol without IV contrast. COMPARISON:  01/13/2017 FINDINGS: CT CHEST FINDINGS Cardiovascular: Normal heart size. No pericardial effusion. Bulky atherosclerotic calcification of the aorta and great vessels. Coronary atherosclerosis. Mediastinum/Nodes: No adenopathy or mass. Moderate sliding hiatal hernia. Lungs/Pleura: Dense consolidation throughout the left lung with poor visualization of central airways. Small left pleural effusion. Musculoskeletal: Nonacute right humeral neck fracture with marked anterior displacement that is similar to initial diagnosis February 2022 by radiography. Extensive spondylosis and thoracic spine ankylosis. Multilevel degenerative facet spurring especially at the open T6-7 level where there is biforaminal impingement. CT ABDOMEN PELVIS FINDINGS Hepatobiliary: No focal liver abnormality.No evidence of biliary obstruction or stone. Pancreas: Unremarkable. Spleen: Unremarkable. Adrenals/Urinary Tract: Negative adrenals. No hydronephrosis or stone. Left upper pole renal lesion which is a cyst by ultrasound in 2020. Peripherally calcified structure in the left upper pole which was also described on prior. Unremarkable bladder. Stomach/Bowel: No obstruction. No visible bowel inflammation. Numerous distal colonic diverticula. Distal colonic stool with moderate distension of the rectum. No superimposed rectal wall thickening.  Vascular/Lymphatic: No acute vascular abnormality. No mass or adenopathy. Reproductive:Hysterectomy Other: No ascites or pneumoperitoneum. Musculoskeletal: No acute abnormalities. Severe lumbar spine degeneration which is generalized. Generalized osteopenia. Hip osteoarthritis that is particularly advanced on  the right. IMPRESSION: 1. Extensive left-sided pneumonia opacifying most of the left lung. Small left parapneumonic effusion. 2. No acute intra-abdominal finding. 3. Numerous chronic/known incidental findings which are described above. Electronically Signed   By: Monte Fantasia M.D.   On: 05/18/2020 11:21        Scheduled Meds:  amitriptyline  10 mg Oral QHS   clopidogrel  75 mg Oral Daily   ipratropium-albuterol  3 mL Nebulization TID   iron polysaccharides  150 mg Oral Daily   levETIRAcetam  250 mg Oral BID   levothyroxine  88 mcg Oral Q0600   mometasone-formoterol  2 puff Inhalation BID   montelukast  10 mg Oral QHS   pantoprazole  20 mg Oral Daily   pregabalin  100 mg Oral QHS   pregabalin  50 mg Oral Daily   simvastatin  40 mg Oral QHS   sodium chloride flush  10-40 mL Intracatheter Q12H   Continuous Infusions:  ceFEPime (MAXIPIME) IV 2 g (05/19/20 1030)   heparin 1,450 Units/hr (05/20/20 0721)   magnesium sulfate bolus IVPB       LOS: 2 days    Time spent: 30 mins     Wyvonnia Dusky, MD [facility] Pager [phone]  If 7PM-7AM, please contact night-coverage 05/20/2020, 8:10 AM

## 2020-05-20 NOTE — Progress Notes (Signed)
Pt's BP 78/38. Pt's vitals was taken at a resting state. Pt is alert, responding to questions. APP notified. LR bolus was ordered ang given. Charge RN also notified for the yellow mews. Will continue to monitor patient.

## 2020-05-20 NOTE — Progress Notes (Signed)
BP now WNL. Pt was also given heparin bolus of 1000 u and rate increased to 13.5. pt resting. Will continue to monitor.

## 2020-05-20 NOTE — Progress Notes (Addendum)
Progress Note  Patient Name: Amy [name] Date of Encounter: 05/20/2020  [facility] Cardiologist: Ida Rogue, MD   Subjective   Pressure soft overnight. Creatinine improving. Patient still feels sob. No chest pain. Patient completed 48 hours IV heparin.   Inpatient Medications    Scheduled Meds: . amitriptyline  10 mg Oral QHS  . clopidogrel  75 mg Oral Daily  . ipratropium-albuterol  3 mL Nebulization TID  . iron polysaccharides  150 mg Oral Daily  . levETIRAcetam  250 mg Oral BID  . levothyroxine  88 mcg Oral Q0600  . mometasone-formoterol  2 puff Inhalation BID  . montelukast  10 mg Oral QHS  . pantoprazole  20 mg Oral Daily  . pregabalin  100 mg Oral QHS  . pregabalin  50 mg Oral Daily  . simvastatin  40 mg Oral QHS  . sodium chloride flush  10-40 mL Intracatheter Q12H   Continuous Infusions: . ceFEPime (MAXIPIME) IV 2 g (05/19/20 1030)  . heparin 1,450 Units/hr (05/20/20 0721)   PRN Meds: acetaminophen, albuterol, dextromethorphan-guaiFENesin, docusate sodium, LORazepam, melatonin, nitroGLYCERIN, ondansetron (ZOFRAN) IV, polyethylene glycol, sodium chloride flush, traMADol   Vital Signs    Vitals:   05/19/20 2319 05/20/20 0145 05/20/20 0344 05/20/20 0739  BP: (!) 123/43 (!) 112/50 (!) 102/41 105/60  Pulse: 70 62 62 (!) 59  Resp: 18 18 18 18   Temp: 97.6 F (36.4 C) 98.5 F (36.9 C) 98.3 F (36.8 C) 97.8 F (36.6 C)  TempSrc: Oral Oral  Oral  SpO2: 96% 97% 97% 100%  Weight:   83.5 kg   Height:        Intake/Output Summary (Last 24 hours) at 05/20/2020 0808 Last data filed at 05/19/2020 1855 Gross per 24 hour  Intake 1123.94 ml  Output 550 ml  Net 573.94 ml   Last 3 Weights 05/20/2020 05/18/2020 05/01/2020  Weight (lbs) 184 lb 190 lb 175 lb  Weight (kg) 83.462 kg 86.183 kg 79.379 kg  Some encounter information is confidential and restricted. Go to Review Flowsheets activity to see all data.      Telemetry    SR, first degree AV [place],  10beats NSVT, HR 60-70s - Personally Reviewed  ECG    No new tracing - Personally Reviewed  Physical Exam   GEN:  she looks very weak today, very somulent ,  Complains of pleuretic CP   Neck: No JVD Cardiac: RRR, no murmurs, rubs, or gallops.  Respiratory: rhonchii, especially left side.  GI: Soft, nontender, non-distended  MS: No edema; No deformity. Neuro:  Nonfocal  Psych: somnolent,   arousable but falls asleep quickly   Labs    High Sensitivity Troponin:   Recent Labs  Lab 05/18/20 0900 05/18/20 1110 05/18/20 1548 05/18/20 1606 05/18/20 1937  TROPONINIHS 638* 933* 1,163* 1,196* 814*      Chemistry Recent Labs  Lab 05/18/20 0900 05/19/20 0731 05/20/20 0025  NA 142 140 137  K 4.6 3.6 3.4*  CL 108 108 107  CO2 25 26 23   GLUCOSE 119* 93 99  BUN 34* 40* 42*  CREATININE 1.66* 1.71* 1.45*  CALCIUM 7.7* 7.5* 7.6*  PROT 5.6*  --   --   ALBUMIN 3.0*  --   --   AST 19  --   --   ALT 8  --   --   ALKPHOS 80  --   --   BILITOT 0.9  --   --   GFRNONAA 29* 28* 34*  ANIONGAP 9 6 7      Hematology Recent Labs  Lab 05/18/20 0900 05/19/20 0731 05/20/20 0025  WBC 4.7 12.6* 14.2*  RBC 3.42* 2.61* 2.31*  HGB 10.2* 8.0* 7.0*  HCT 33.6* 25.3* 22.6*  MCV 98.2 96.9 97.8  MCH 29.8 30.7 30.3  MCHC 30.4 31.6 31.0  RDW 15.6* 15.7* 15.7*  PLT 260 237 204    BNP Recent Labs  Lab 05/18/20 1110  BNP 748.3*     DDimer No results for input(s): DDIMER in the last 168 hours.   Radiology    DG Chest Port 1 View  Result Date: 05/18/2020 CLINICAL DATA:  Shortness of breath and fever EXAM: PORTABLE CHEST 1 VIEW COMPARISON:  March 06, 2020 FINDINGS: There is extensive airspace opacity throughout most of the left lung. The right lung is clear. There is cardiomegaly with mild pulmonary venous hypertension. No adenopathy. There is aortic atherosclerosis. There is calcification in each carotid artery. No bone lesions. IMPRESSION: Widespread airspace opacity throughout much  of the left lung consistent with multifocal pneumonia. Right lung clear. There is cardiomegaly with a degree of pulmonary vascular congestion. There is calcification in each carotid artery. Aortic Atherosclerosis (ICD10-I70.0). Electronically Signed   By: Lowella Grip III M.D.   On: 05/18/2020 09:08   CT CHEST ABDOMEN PELVIS WO CONTRAST  Result Date: 05/18/2020 CLINICAL DATA:  Chest pain or shortness of breath. Pleural effusion or pleurisy suspected EXAM: CT CHEST, ABDOMEN AND PELVIS WITHOUT CONTRAST TECHNIQUE: Multidetector CT imaging of the chest, abdomen and pelvis was performed following the standard protocol without IV contrast. COMPARISON:  01/13/2017 FINDINGS: CT CHEST FINDINGS Cardiovascular: Normal heart size. No pericardial effusion. Bulky atherosclerotic calcification of the aorta and great vessels. Coronary atherosclerosis. Mediastinum/Nodes: No adenopathy or mass. Moderate sliding hiatal hernia. Lungs/Pleura: Dense consolidation throughout the left lung with poor visualization of central airways. Small left pleural effusion. Musculoskeletal: Nonacute right humeral neck fracture with marked anterior displacement that is similar to initial diagnosis February 2022 by radiography. Extensive spondylosis and thoracic spine ankylosis. Multilevel degenerative facet spurring especially at the open T6-7 level where there is biforaminal impingement. CT ABDOMEN PELVIS FINDINGS Hepatobiliary: No focal liver abnormality.No evidence of biliary obstruction or stone. Pancreas: Unremarkable. Spleen: Unremarkable. Adrenals/Urinary Tract: Negative adrenals. No hydronephrosis or stone. Left upper pole renal lesion which is a cyst by ultrasound in 2020. Peripherally calcified structure in the left upper pole which was also described on prior. Unremarkable bladder. Stomach/Bowel: No obstruction. No visible bowel inflammation. Numerous distal colonic diverticula. Distal colonic stool with moderate distension of the  rectum. No superimposed rectal wall thickening. Vascular/Lymphatic: No acute vascular abnormality. No mass or adenopathy. Reproductive:Hysterectomy Other: No ascites or pneumoperitoneum. Musculoskeletal: No acute abnormalities. Severe lumbar spine degeneration which is generalized. Generalized osteopenia. Hip osteoarthritis that is particularly advanced on the right. IMPRESSION: 1. Extensive left-sided pneumonia opacifying most of the left lung. Small left parapneumonic effusion. 2. No acute intra-abdominal finding. 3. Numerous chronic/known incidental findings which are described above. Electronically Signed   By: Monte Fantasia M.D.   On: 05/18/2020 11:21    Cardiac Studies   Echo 01/2020  1. Left ventricular ejection fraction, by estimation, is 55 to 60%. The  left ventricle has normal function. The left ventricle has no regional  wall motion abnormalities. There is mild left ventricular hypertrophy.  Left ventricular diastolic parameters  are consistent with Grade II diastolic dysfunction (pseudonormalization).  Elevated left atrial pressure.  2. Right ventricular systolic function is normal. The right ventricular  size is mildly enlarged. There is mildly elevated pulmonary artery  systolic pressure.  3. Left atrial size was mildly dilated.  4. Right atrial size was mildly dilated.  5. The mitral valve is abnormal. Trivial mitral valve regurgitation. Mild  to moderate mitral stenosis. Severe mitral annular calcification.  6. The aortic valve is tricuspid. There is mild calcification of the  aortic valve. There is mild thickening of the aortic valve. Aortic valve  regurgitation is trivial. Mild aortic valve stenosis. Aortic valve mean  gradient measures 12.0 mmHg.  7. The inferior vena cava is normal in size with <50% respiratory  variability, suggesting right atrial pressure of 8 mmHg.   Myoview 03/2017 Narrative & Impression  Pharmacological myocardial perfusion imaging  study with no significant Ischemia Small region of fixed apical thinning, consistent with attenuation artifact GI uptake artifact noted Normal wall motion, EF estimated at 56% No EKG changes concerning for ischemia at peak stress or in recovery. Low risk scan   Signed, Esmond Plants, MD, Ph.D [facility]      Patient Profile     87 y.o. 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  Assessment & Plan   NSTEMI Known CAD with prior stenting - admitted with significant pna and dyspnea - no anginal symptoms reported - HS troponin elevated to 933 - Cath in 2011 described above. Last ischemic eval was in 03/2017 by Leane Call showing no new ischemia, overall low risk  - Echo from 01/2020 showed preserved EF, no WMA - completed 48 hours of IV heparin - suspect more demand ischemia and do not think we will pursue ischemic eval at this time - continue plavix, statin  HTN - intermittently low - ACE held  PNA Sepsis - procal elevated - WBC up from yesterday - LA normal - abx per IM  AKI - creatinine improving - ACE and Torsemide held  HFpEF - Last echo was 01/2020 showing preserved EF with G2DD - On admission BNP elevated and possible vascular congestion - Appears euvolemic on exam - Home torsemide held for AKI -  ACE held for hypotension. Not on BB with history of bradycardia/dizziness/syncope  PAF not on anticoagulation - Not on AC due to high fall risk - In SR  HTN with hypotension - PTA lisinopril 2.5mg  daily, restart when able  HLD - continue statin - LDL 36  NSVT - 10 beats NSVT - Not on BB with h/o bradycardia/dizziness/syncope - Normal  EF in 01/2020. Can consider limited echo  For questions or updates, please contact [place] Please consult [email or web address] for contact info under        Signed, Cadence Ninfa Meeker, PA-C  05/20/2020, 8:08 AM     Attending Note:   The patient was seen and examined.  Agree with assessment and plan as noted above.  Changes made to the above note as needed.  Patient seen and independently examined with  Cadence Kathlen Mody, PA .   We discussed all aspects of the encounter. I agree with the assessment and plan as stated above.  1.   NSTEMI:   Elevated troponins.   Likely due to demand ischemia .  No further episodes of CP   2.  HTN:   BP meds are on hold for low bp currently   3.  Pneumonia:   Associated with sepsis  She is more sleepy today  Complains of pleuretic cp.   She has a very large and dense pneumonia by CT scan     I have spent a total of 40 minutes with patient reviewing hospital  notes , telemetry, EKGs, labs and examining patient as well as establishing an assessment and plan that was discussed with the patient. > 50% of time was spent in direct patient care.    Thayer Headings, Brooke Bonito., MD, [facility] 05/20/2020, 11:34 AM [address],  [place] Pager [phone]

## 2020-05-20 NOTE — Progress Notes (Addendum)
ANTICOAGULATION CONSULT NOTE  Pharmacy Consult for heparin infusion Indication: chest pain/ACS  Patient Measurements: Height: 5\' 2"  (157.5 cm) Weight: 83.5 kg (184 lb) IBW/kg (Calculated) : 50.1 Heparin Dosing Weight: 70 kg  Vital Signs: Temp: 98.3 F (36.8 C) (03/27 0344) Temp Source: Oral (03/27 0145) BP: 102/41 (03/27 0344) Pulse Rate: 62 (03/27 0344)  Labs: Recent Labs    05/18/20 0900 05/18/20 1110 05/18/20 1548 05/18/20 1606 05/18/20 1937 05/18/20 2200 05/19/20 0731 05/19/20 1746 05/20/20 0025  HGB 10.2*  --   --   --   --   --  8.0*  --  7.0*  HCT 33.6*  --   --   --   --   --  25.3*  --  22.6*  PLT 260  --   --   --   --   --  237  --  204  APTT 31  --   --   --   --   --   --   --   --   LABPROT 14.4  --   --   --   --   --   --   --   --   INR 1.2  --   --   --   --   --   --   --   --   HEPARINUNFRC  --   --   --   --   --    < > 0.29* 0.20* 0.25*  CREATININE 1.66*  --   --   --   --   --  1.71*  --   --   TROPONINIHS 638*   < > 1,163* 1,196* 814*  --   --   --   --    < > = values in this interval not displayed.    Estimated Creatinine Clearance: 21.5 mL/min (A) (by C-G formula based on SCr of 1.71 mg/dL (H)).   Medical History: Past Medical History:  Diagnosis Date  . Bladder incontinence   . CAD (coronary artery disease)    a. 05/2009 Cath: LAD 90p (Xience 2.75 x 12 mm DES), 27m, D1 40, LCx 40p/m, RCA 30/40/30p/m, RCA 30/25d;  b.  12/2011 Lexiscan MV: no ischemia, breast attenuation artifact, normal EF-->Low risk; c. 10/2016 MV: fixed apical defect, most likely apical thinning and attenuation, No ischemia, EF 60%.  . Cancer ([place])    ovarian  . Carotid arterial disease w/ R Carotid Bruit (HCC)    a. 08/2016 Carotid U/S: 40-59% bilat ICA stenosis - f/u 1 yr.  . Chronic diastolic (congestive) heart failure ([place])    a. Echo 09/2015: EF 55-60% w/ Grade 1 DD, sev Ca2+ MV annulus, mildly dil LA; b. 09/2016 Echo: EF 65-70%, Gr2 DD, mildly dil LA/RA, nl RV  fxn.  . Chronic Dyspnea on exertion   . CKD (chronic kidney disease) stage 3, GFR 30-59 ml/min (HCC)   . Degenerative arthritis of knee    bilateral knees  . Diabetes mellitus    Type II  . GIB (gastrointestinal bleeding)    a. 08/2016 Admission w/ presyncope/anemia/melena-->req Transfusion-->endo ok, colonoscopy w/ polyps but no source of bleeding (most likely diverticular).  . Hiatal hernia   . Hypertension   . Iron deficiency   . Menopausal symptoms   . Morbid obesity ([place])   . Renal insufficiency   . Thyroid disease    hypothyroidism    Assessment: 87 y.o. female with coronary disease, congestive heart failure, CKD  who comes in for abdominal pain, shortness of breath and fever. Her Med Rec has been completed and she is noted to be on clopidogrel prior to admission. H&H trending down, platelets wnl  Goal of Therapy:  Heparin level 0.3-0.7 units/ml Monitor platelets by anticoagulation protocol: Yes   3/27 0025 HL 0.25 (HL drawn only 4 hrs after rate change, was due for AM labs)  Plan:   Heparin level subtherapeutic, will order 1000 unit bolus x 1 and increase heparin infusion rate to 1450 units/hr  Due to early HL, will recheck next HL in 6 hours after rate change  CBC daily while on IV heparin  Renda Rolls, PharmD, [facility] 05/20/2020 6:41 AM

## 2020-05-20 NOTE — Plan of Care (Signed)

## 2020-05-20 NOTE — Progress Notes (Signed)
ANTICOAGULATION CONSULT NOTE  Pharmacy Consult for heparin infusion Indication: chest pain/ACS  Patient Measurements: Height: 5\' 2"  (157.5 cm) Weight: 83.5 kg (184 lb) IBW/kg (Calculated) : 50.1 Heparin Dosing Weight: 70 kg  Vital Signs: Temp: 97.6 F (36.4 C) (03/27 1539) Temp Source: Oral (03/27 1151) BP: 105/59 (03/27 1539) Pulse Rate: 66 (03/27 1539)  Labs: Recent Labs    05/18/20 0900 05/18/20 1110 05/18/20 1548 05/18/20 1606 05/18/20 1937 05/18/20 2200 05/19/20 0731 05/19/20 1746 05/20/20 0025 05/20/20 1232 05/20/20 1536 05/20/20 1939  HGB 10.2*  --   --   --   --   --  8.0*  --  7.0*  --  7.6*  --   HCT 33.6*  --   --   --   --   --  25.3*  --  22.6*  --  25.1*  --   PLT 260  --   --   --   --   --  237  --  204  --   --   --   APTT 31  --   --   --   --   --   --   --   --   --   --   --   LABPROT 14.4  --   --   --   --   --   --   --   --   --   --   --   INR 1.2  --   --   --   --   --   --   --   --   --   --   --   HEPARINUNFRC  --   --   --   --   --    < > 0.29*   < > 0.25* 0.42  --  0.34  CREATININE 1.66*  --   --   --   --   --  1.71*  --  1.45*  --   --   --   TROPONINIHS 638*   < > 1,163* 1,196* 814*  --   --   --   --   --   --   --    < > = values in this interval not displayed.    Estimated Creatinine Clearance: 25.3 mL/min (A) (by C-G formula based on SCr of 1.45 mg/dL (H)).   Medical History: Past Medical History:  Diagnosis Date  . Bladder incontinence   . CAD (coronary artery disease)    a. 05/2009 Cath: LAD 90p (Xience 2.75 x 12 mm DES), 96m, D1 40, LCx 40p/m, RCA 30/40/30p/m, RCA 30/25d;  b.  12/2011 Lexiscan MV: no ischemia, breast attenuation artifact, normal EF-->Low risk; c. 10/2016 MV: fixed apical defect, most likely apical thinning and attenuation, No ischemia, EF 60%.  . Cancer ([place])    ovarian  . Carotid arterial disease w/ R Carotid Bruit (HCC)    a. 08/2016 Carotid U/S: 40-59% bilat ICA stenosis - f/u 1 yr.  . Chronic  diastolic (congestive) heart failure ([place])    a. Echo 09/2015: EF 55-60% w/ Grade 1 DD, sev Ca2+ MV annulus, mildly dil LA; b. 09/2016 Echo: EF 65-70%, Gr2 DD, mildly dil LA/RA, nl RV fxn.  . Chronic Dyspnea on exertion   . CKD (chronic kidney disease) stage 3, GFR 30-59 ml/min (HCC)   . Degenerative arthritis of knee    bilateral knees  . Diabetes mellitus  Type II  . GIB (gastrointestinal bleeding)    a. 08/2016 Admission w/ presyncope/anemia/melena-->req Transfusion-->endo ok, colonoscopy w/ polyps but no source of bleeding (most likely diverticular).  . Hiatal hernia   . Hypertension   . Iron deficiency   . Menopausal symptoms   . Morbid obesity ([place])   . Renal insufficiency   . Thyroid disease    hypothyroidism    Assessment: 87 y.o. female with coronary disease, congestive heart failure, CKD who comes in for abdominal pain, shortness of breath and fever. Her Med Rec has been completed and she is noted to be on clopidogrel prior to admission and no other chronic antioagulation. H&H continue trending down, platelets wnl  Goal of Therapy:  Heparin level 0.3-0.7 units/ml Monitor platelets by anticoagulation protocol: Yes   Plan:   Anti-Xa level therapeutic: continue heparin infusion at 1450 units/hr  anti-Xa level daily  CBC daily while on IV heparin  Darnelle Bos, PharmD 05/20/2020 8:54 PM

## 2020-05-20 NOTE — Progress Notes (Signed)
ANTICOAGULATION CONSULT NOTE  Pharmacy Consult for heparin infusion Indication: chest pain/ACS  Patient Measurements: Height: 5\' 2"  (157.5 cm) Weight: 83.5 kg (184 lb) IBW/kg (Calculated) : 50.1 Heparin Dosing Weight: 70 kg  Vital Signs: Temp: 98.5 F (36.9 C) (03/27 1151) Temp Source: Oral (03/27 1151) BP: 108/37 (03/27 1151) Pulse Rate: 68 (03/27 1151)  Labs: Recent Labs    05/18/20 0900 05/18/20 1110 05/18/20 1548 05/18/20 1606 05/18/20 1937 05/18/20 2200 05/19/20 0731 05/19/20 1746 05/20/20 0025 05/20/20 1232  HGB 10.2*  --   --   --   --   --  8.0*  --  7.0*  --   HCT 33.6*  --   --   --   --   --  25.3*  --  22.6*  --   PLT 260  --   --   --   --   --  237  --  204  --   APTT 31  --   --   --   --   --   --   --   --   --   LABPROT 14.4  --   --   --   --   --   --   --   --   --   INR 1.2  --   --   --   --   --   --   --   --   --   HEPARINUNFRC  --   --   --   --   --    < > 0.29* 0.20* 0.25* 0.42  CREATININE 1.66*  --   --   --   --   --  1.71*  --  1.45*  --   TROPONINIHS 638*   < > 1,163* 1,196* 814*  --   --   --   --   --    < > = values in this interval not displayed.    Estimated Creatinine Clearance: 25.3 mL/min (A) (by C-G formula based on SCr of 1.45 mg/dL (H)).   Medical History: Past Medical History:  Diagnosis Date  . Bladder incontinence   . CAD (coronary artery disease)    a. 05/2009 Cath: LAD 90p (Xience 2.75 x 12 mm DES), 55m, D1 40, LCx 40p/m, RCA 30/40/30p/m, RCA 30/25d;  b.  12/2011 Lexiscan MV: no ischemia, breast attenuation artifact, normal EF-->Low risk; c. 10/2016 MV: fixed apical defect, most likely apical thinning and attenuation, No ischemia, EF 60%.  . Cancer ([place])    ovarian  . Carotid arterial disease w/ R Carotid Bruit (HCC)    a. 08/2016 Carotid U/S: 40-59% bilat ICA stenosis - f/u 1 yr.  . Chronic diastolic (congestive) heart failure ([place])    a. Echo 09/2015: EF 55-60% w/ Grade 1 DD, sev Ca2+ MV annulus, mildly dil LA;  b. 09/2016 Echo: EF 65-70%, Gr2 DD, mildly dil LA/RA, nl RV fxn.  . Chronic Dyspnea on exertion   . CKD (chronic kidney disease) stage 3, GFR 30-59 ml/min (HCC)   . Degenerative arthritis of knee    bilateral knees  . Diabetes mellitus    Type II  . GIB (gastrointestinal bleeding)    a. 08/2016 Admission w/ presyncope/anemia/melena-->req Transfusion-->endo ok, colonoscopy w/ polyps but no source of bleeding (most likely diverticular).  . Hiatal hernia   . Hypertension   . Iron deficiency   . Menopausal symptoms   . Morbid obesity ([place])   .  Renal insufficiency   . Thyroid disease    hypothyroidism    Assessment: 87 y.o. female with coronary disease, congestive heart failure, CKD who comes in for abdominal pain, shortness of breath and fever. Her Med Rec has been completed and she is noted to be on clopidogrel prior to admission and no other chronic antioagulation. H&H continue trending down, platelets wnl  Goal of Therapy:  Heparin level 0.3-0.7 units/ml Monitor platelets by anticoagulation protocol: Yes   Plan:   Anti-Xa level therapeutic: continue heparin infusion at 1450 units/hr  recheck next anti-Xa level  in 6 hours to confirm  CBC daily while on IV heparin  Vallery Sa, PharmD 05/20/2020 2:43 PM

## 2020-05-21 DIAGNOSIS — N184 Chronic kidney disease, stage 4 (severe): Secondary | ICD-10-CM | POA: Diagnosis not present

## 2020-05-21 DIAGNOSIS — J189 Pneumonia, unspecified organism: Secondary | ICD-10-CM | POA: Diagnosis not present

## 2020-05-21 DIAGNOSIS — I214 Non-ST elevation (NSTEMI) myocardial infarction: Secondary | ICD-10-CM | POA: Diagnosis not present

## 2020-05-21 LAB — HEMOGLOBIN AND HEMATOCRIT, BLOOD
HCT: 25.5 % — ABNORMAL LOW (ref 36.0–46.0)
Hemoglobin: 8.3 g/dL — ABNORMAL LOW (ref 12.0–15.0)

## 2020-05-21 LAB — CBC
HCT: 23.2 % — ABNORMAL LOW (ref 36.0–46.0)
Hemoglobin: 7.2 g/dL — ABNORMAL LOW (ref 12.0–15.0)
MCH: 30.1 pg (ref 26.0–34.0)
MCHC: 31 g/dL (ref 30.0–36.0)
MCV: 97.1 fL (ref 80.0–100.0)
Platelets: 219 10*3/uL (ref 150–400)
RBC: 2.39 MIL/uL — ABNORMAL LOW (ref 3.87–5.11)
RDW: 15.3 % (ref 11.5–15.5)
WBC: 12.3 10*3/uL — ABNORMAL HIGH (ref 4.0–10.5)
nRBC: 0 % (ref 0.0–0.2)

## 2020-05-21 LAB — BASIC METABOLIC PANEL
Anion gap: 7 (ref 5–15)
BUN: 43 mg/dL — ABNORMAL HIGH (ref 8–23)
CO2: 24 mmol/L (ref 22–32)
Calcium: 7.8 mg/dL — ABNORMAL LOW (ref 8.9–10.3)
Chloride: 110 mmol/L (ref 98–111)
Creatinine, Ser: 1.53 mg/dL — ABNORMAL HIGH (ref 0.44–1.00)
GFR, Estimated: 32 mL/min — ABNORMAL LOW (ref >60)
Glucose, Bld: 101 mg/dL — ABNORMAL HIGH (ref 70–99)
Potassium: 4.1 mmol/L (ref 3.5–5.1)
Sodium: 141 mmol/L (ref 135–145)

## 2020-05-21 LAB — GLUCOSE, CAPILLARY: Glucose-Capillary: 115 mg/dL — ABNORMAL HIGH (ref 70–99)

## 2020-05-21 LAB — LEGIONELLA PNEUMOPHILA SEROGP 1 UR AG: L. pneumophila Serogp 1 Ur Ag: NEGATIVE

## 2020-05-21 LAB — MAGNESIUM: Magnesium: 1.9 mg/dL (ref 1.7–2.4)

## 2020-05-21 LAB — HEPARIN LEVEL (UNFRACTIONATED): Heparin Unfractionated: 0.24 IU/mL — ABNORMAL LOW (ref 0.30–0.70)

## 2020-05-21 MED ORDER — HEPARIN (PORCINE) 25000 UT/250ML-% IV SOLN
1600.0000 [IU]/h | INTRAVENOUS | Status: DC
  Administered 2020-05-21: 1600 [IU]/h via INTRAVENOUS
  Filled 2020-05-21: qty 250

## 2020-05-21 MED ORDER — DM-GUAIFENESIN ER 30-600 MG PO TB12
1.0000 | ORAL_TABLET | Freq: Two times a day (BID) | ORAL | Status: DC
  Administered 2020-05-21 – 2020-06-02 (×25): 1 via ORAL
  Filled 2020-05-21 (×26): qty 1

## 2020-05-21 MED ORDER — ENOXAPARIN SODIUM 30 MG/0.3ML ~~LOC~~ SOLN
30.0000 mg | SUBCUTANEOUS | Status: DC
  Administered 2020-05-22: 30 mg via SUBCUTANEOUS
  Filled 2020-05-21: qty 0.3

## 2020-05-21 MED ORDER — ATORVASTATIN CALCIUM 20 MG PO TABS
20.0000 mg | ORAL_TABLET | Freq: Every day | ORAL | Status: DC
  Administered 2020-05-21 – 2020-06-02 (×13): 20 mg via ORAL
  Filled 2020-05-21 (×8): qty 1
  Filled 2020-05-21: qty 2
  Filled 2020-05-21 (×4): qty 1

## 2020-05-21 MED ORDER — HEPARIN BOLUS VIA INFUSION
1000.0000 [IU] | Freq: Once | INTRAVENOUS | Status: AC
  Administered 2020-05-21: 1000 [IU] via INTRAVENOUS
  Filled 2020-05-21: qty 1000

## 2020-05-21 MED ORDER — AMLODIPINE BESYLATE 5 MG PO TABS
2.5000 mg | ORAL_TABLET | Freq: Every day | ORAL | Status: DC
  Administered 2020-05-21 – 2020-05-23 (×3): 2.5 mg via ORAL
  Filled 2020-05-21 (×4): qty 1

## 2020-05-21 MED ORDER — ACETAMINOPHEN 325 MG PO TABS
650.0000 mg | ORAL_TABLET | Freq: Four times a day (QID) | ORAL | Status: DC | PRN
  Administered 2020-05-21 – 2020-06-02 (×10): 650 mg via ORAL
  Filled 2020-05-21 (×10): qty 2

## 2020-05-21 NOTE — Progress Notes (Addendum)
PROGRESS NOTE    Amy [name]  FIE:[id number] DOB: 01-28-1928 DOA: 05/18/2020 PCP: Alvester Morin, MD   Assessment & Plan:   Principal Problem:   HCAP (healthcare-associated pneumonia) Active Problems:   Acquired hypothyroidism   HLD (hyperlipidemia)   Essential hypertension   Iron deficiency anemia   Chronic diastolic CHF (congestive heart failure) (HCC)   COPD with chronic bronchitis (HCC)   Severe sepsis (HCC)   CKD (chronic kidney disease) stage 4, GFR 15-29 ml/min (HCC)   HTN (hypertension)   GERD (gastroesophageal reflux disease)   CAD (coronary artery disease)   Atrial fibrillation (HCC)   Seizure (HCC)   Elevated troponin   Abdominal pain   Diarrhea   Acute respiratory failure with hypoxia (HCC)  Severe sepsis: secondary to HCAP. Meets critieria w/ tachycardia, tachypnea, elevated lactic acid. Had a Covid infection 03/06/2020, COVID19 PCR still positive. No longer needs isolation. Will not treat COVID19. Continue on IV cefepime. Continue on bronchodilators and encourage incentive spirometry. Resolved   HCAP: continue on IV cefepime. Continue on bronchodilators, mucinex & encourage incentive spirometry. Legionella pending still   NSTEMI: troponins >1,000 but trending down. IV heparin drip d/c as per cardio. Nitro prn. Currently is not a candidate for cardiac cath as per cardio  Leukocytosis: likely secondary to infection. Continue on IV abxs   Hypothyroidism: continue on levothyroxine   HLD: continue on statin   HTN: started on amlodipine as per cardio   IDA: continue on iron supplement. H&H are labile. Repeat H&H ordered  Chronic diastolic CHF: echo on 16/60/6301 showed EF of 55 to 60%, grade II diastolic dysfunction. Appears euvolemic   COPD: w/o exacerbation. Continue on bronchodilators and encourage incentive spirometry   CKDIV: Cr continues to trend up daily. Avoid nephrotoxic meds   Hypokalemia: WNL today   Hypomagnesemia: WNL  today   Leukocytosis: likely secondary to infection. Continue on IV abxs   GERD: continue on PPI   CAD: continue on statin, plavix. Continue on tele   A. fib: likely PAF. Not on anticoagulation likely secondary to high fall risk   Seizures: continue on home dose of keppra. Seizure precautions    Abdominal pain and diarrhea: resolved      DVT prophylaxis: SCDs  Code Status: DNR Family Communication: discussed pt's care w/ pt's daughter, [name] and and answered her questions  Disposition Plan:  depends on PT/OT recs  Level of care: Progressive Cardiac   Status is: Inpatient  Remains inpatient appropriate because:Unsafe d/c plan, IV treatments appropriate due to intensity of illness or inability to take PO and Inpatient level of care appropriate due to severity of illness   Dispo: The patient is from: Home              Anticipated d/c is to: Home              Patient currently is not medically stable to d/c.   Difficult to place patient Yes     Consultants:   Cardio    Procedures:  Antimicrobials:   Subjective: Pt c/o weakness  Objective: Vitals:   05/20/20 1539 05/20/20 2105 05/21/20 0500 05/21/20 0751  BP: (!) 105/59 (!) 160/59 (!) 155/50 (!) 155/56  Pulse: 66 64 66 70  Resp: 18 18 18    Temp: 97.6 F (36.4 C) 98.4 F (36.9 C) 98 F (36.7 C) 97.9 F (36.6 C)  TempSrc:  Oral  Oral  SpO2: 100% 99% 98% 98%  Weight:      Height:  Intake/Output Summary (Last 24 hours) at 05/21/2020 0808 Last data filed at 05/21/2020 6269 Gross per 24 hour  Intake 494.85 ml  Output 900 ml  Net -405.15 ml   Filed Weights   05/18/20 0853 05/20/20 0344  Weight: 86.2 kg 83.5 kg    Examination:  General exam: Appears uncomfortable. Frail appearing  Respiratory system: diminished breath sounds b/l  Cardiovascular system: S1 & S2+. No rubs or clicks  Gastrointestinal system: Abd is soft, NT, obese & normal bowel sounds   Central nervous system: Alert and  oriented. Moves all 4 extremities Psychiatry: judgement and insight appear normal. Flat mood and affect     Data Reviewed: I have personally reviewed following labs and imaging studies  CBC: Recent Labs  Lab 05/18/20 0900 05/19/20 0731 05/20/20 0025 05/20/20 1536 05/21/20 0318  WBC 4.7 12.6* 14.2*  --  12.3*  NEUTROABS 3.7  --   --   --   --   HGB 10.2* 8.0* 7.0* 7.6* 7.2*  HCT 33.6* 25.3* 22.6* 25.1* 23.2*  MCV 98.2 96.9 97.8  --  97.1  PLT 260 237 204  --  485   Basic Metabolic Panel: Recent Labs  Lab 05/18/20 0900 05/19/20 0731 05/20/20 0025 05/20/20 0457 05/21/20 0318  NA 142 140 137  --  141  K 4.6 3.6 3.4*  --  4.1  CL 108 108 107  --  110  CO2 25 26 23   --  24  GLUCOSE 119* 93 99  --  101*  BUN 34* 40* 42*  --  43*  CREATININE 1.66* 1.71* 1.45*  --  1.53*  CALCIUM 7.7* 7.5* 7.6*  --  7.8*  MG  --   --   --  1.4* 1.9  PHOS  --   --   --  3.6  --    GFR: Estimated Creatinine Clearance: 24 mL/min (A) (by C-G formula based on SCr of 1.53 mg/dL (H)). Liver Function Tests: Recent Labs  Lab 05/18/20 0900  AST 19  ALT 8  ALKPHOS 80  BILITOT 0.9  PROT 5.6*  ALBUMIN 3.0*   No results for input(s): LIPASE, AMYLASE in the last 168 hours. No results for input(s): AMMONIA in the last 168 hours. Coagulation Profile: Recent Labs  Lab 05/18/20 0900  INR 1.2   Cardiac Enzymes: No results for input(s): CKTOTAL, CKMB, CKMBINDEX, TROPONINI in the last 168 hours. BNP (last 3 results) No results for input(s): PROBNP in the last 8760 hours. HbA1C: Recent Labs    05/19/20 0731  HGBA1C 5.3   CBG: Recent Labs  Lab 05/18/20 2154  GLUCAP 125*   Lipid Profile: Recent Labs    05/19/20 0731  CHOL 77  HDL 29*  LDLCALC 36  TRIG 62  CHOLHDL 2.7   Thyroid Function Tests: No results for input(s): TSH, T4TOTAL, FREET4, T3FREE, THYROIDAB in the last 72 hours. Anemia Panel: No results for input(s): VITAMINB12, FOLATE, FERRITIN, TIBC, IRON, RETICCTPCT in the  last 72 hours. Sepsis Labs: Recent Labs  Lab 05/18/20 0900 05/18/20 1110 05/19/20 0731 05/19/20 2136 05/20/20 0021 05/20/20 0025  PROCALCITON 5.26  --  14.80  --   --  8.46  LATICACIDVEN 2.9* 1.5  --  0.9 0.8  --     Recent Results (from the past 240 hour(s))  Blood Culture (routine x 2)     Status: None (Preliminary result)   Collection Time: 05/18/20  9:00 AM   Specimen: BLOOD  Result Value Ref Range Status  Specimen Description BLOOD LEFT ANTECUBITAL  Final   Special Requests   Final    BOTTLES DRAWN AEROBIC AND ANAEROBIC Blood Culture results may not be optimal due to an inadequate volume of blood received in culture bottles   Culture   Final    NO GROWTH 3 DAYS Performed at [facility], [address] [place], [place] [address]    Report Status PENDING  Incomplete  Blood Culture (routine x 2)     Status: None (Preliminary result)   Collection Time: 05/18/20  9:00 AM   Specimen: BLOOD  Result Value Ref Range Status   Specimen Description BLOOD BLOOD LEFT HAND  Final   Special Requests   Final    BOTTLES DRAWN AEROBIC AND ANAEROBIC Blood Culture results may not be optimal due to an inadequate volume of blood received in culture bottles   Culture   Final    NO GROWTH 3 DAYS Performed at [facility], [address] [place], [place] [address]    Report Status PENDING  Incomplete  Resp Panel by RT-PCR (Flu A&B, Covid) Nasopharyngeal Swab     Status: Abnormal   Collection Time: 05/18/20 10:04 AM   Specimen: Nasopharyngeal Swab; Nasopharyngeal(NP) swabs in vial transport medium  Result Value Ref Range Status   SARS Coronavirus 2 by RT PCR POSITIVE (A) NEGATIVE Corrected    Comment: RESULT CALLED TO, READ BACK BY AND VERIFIED WITH:  TAYLOR LEWIS AT 1115 05/18/20 SDR (NOTE) SARS-CoV-2 target nucleic acids are DETECTED.  The SARS-CoV-2 RNA is generally detectable in upper respiratory specimens during the acute phase of infection. Positive results  are indicative of the presence of the identified virus, but do not rule out bacterial infection or co-infection with other pathogens not detected by the test. Clinical correlation with patient history and other diagnostic information is necessary to determine patient infection status. The expected result is Negative.  Fact Sheet for Patients: EntrepreneurPulse.com.au  Fact Sheet for Healthcare Providers: IncredibleEmployment.be  This test is not yet approved or cleared by the [place] FDA and  has been authorized for detection and/or diagnosis of SARS-CoV-2 by FDA under an Emergency Use Authorization (EUA).  This EUA will remain in effect (meaning this test can be  used) for the duration of  the COVID-19 declaration under Section 564(b)(1) of the Act, 21 U.S.C. section 360bbb-3(b)(1), unless the authorization is terminated or revoked sooner.  CORRECTED ON 03/26 AT 9622: PREVIOUSLY REPORTED AS POSITIVE RESULT CALLED TO, READ BACK BY AND VERIFIED WITH:  Lovena Le LEWIS AT 2979 04/20/20 SDR    Influenza A by PCR NEGATIVE NEGATIVE Final   Influenza B by PCR NEGATIVE NEGATIVE Final    Comment: (NOTE) The Xpert Xpress SARS-CoV-2/FLU/RSV plus assay is intended as an aid in the diagnosis of influenza from Nasopharyngeal swab specimens and should not be used as a sole basis for treatment. Nasal washings and aspirates are unacceptable for Xpert Xpress SARS-CoV-2/FLU/RSV testing.  Fact Sheet for Patients: EntrepreneurPulse.com.au  Fact Sheet for Healthcare Providers: IncredibleEmployment.be  This test is not yet approved or cleared by the [place] FDA and has been authorized for detection and/or diagnosis of SARS-CoV-2 by FDA under an Emergency Use Authorization (EUA). This EUA will remain in effect (meaning this test can be used) for the duration of the COVID-19 declaration under Section 564(b)(1) of the Act,  21 U.S.C. section 360bbb-3(b)(1), unless the authorization is terminated or revoked.  Performed at [facility], [address] [place], [place] [address]  Urine culture     Status: None   Collection Time: 05/18/20 11:45 AM   Specimen: Urine, Random  Result Value Ref Range Status   Specimen Description   Final    URINE, RANDOM Performed at [facility], [address] [place], [place] [address]    Special Requests   Final    NONE Performed at [facility], [address], [place], [place] [address]    Culture   Final    NO GROWTH Performed at [facility], [place] [address] [place], [place] [address]    Report Status 05/19/2020 FINAL  Final  MRSA PCR Screening     Status: None   Collection Time: 05/18/20  3:48 PM   Specimen: Nasal Mucosa; Nasopharyngeal  Result Value Ref Range Status   MRSA by PCR NEGATIVE NEGATIVE Final    Comment:        The GeneXpert MRSA Assay (FDA approved for NASAL specimens only), is one component of a comprehensive MRSA colonization surveillance program. It is not intended to diagnose MRSA infection nor to guide or monitor treatment for MRSA infections. Performed at [facility], [address] [place], [place] [address]          Radiology Studies: No results found.      Scheduled Meds: . amitriptyline  10 mg Oral QHS  . clopidogrel  75 mg Oral Daily  . ipratropium-albuterol  3 mL Nebulization BID  . iron polysaccharides  150 mg Oral Daily  . levETIRAcetam  250 mg Oral BID  . levothyroxine  88 mcg Oral Q0600  . mometasone-formoterol  2 puff Inhalation BID  . montelukast  10 mg Oral QHS  . pantoprazole  20 mg Oral Daily  . pregabalin  100 mg Oral QHS  . pregabalin  50 mg Oral Daily  . simvastatin  40 mg Oral QHS  . sodium chloride flush  10-40 mL Intracatheter Q12H   Continuous Infusions: . ceFEPime (MAXIPIME) IV Stopped (05/20/20 1006)  . heparin 1,600 Units/hr (05/21/20  2094)     LOS: 3 days    Time spent: 31 mins     Wyvonnia Dusky, MD [facility] Pager [phone]  If 7PM-7AM, please contact night-coverage 05/21/2020, 8:08 AM

## 2020-05-21 NOTE — Progress Notes (Signed)
Pharmacy Antibiotic Note  Amy [name] is a 87 y.o. female with PMH of CAD, CHF, CKD, DM, HTN, hypothyroidism is admitted on 05/18/2020 with pneumonia and ACS.  Pharmacy has been consulted for vancomycin and cefepime dosing. While in the ED she received 2 grams IV cefepime and a total of 1750 mg IV vancomycin. Her SCr on admission is noted to be higher than her apparent baseline level  Plan:  Continue cefepime 2 grams IV every 24 hours (day 4 of abx). Recommend a total duration of abx for 7-10 days.    Height: 5\' 2"  (157.5 cm) Weight: 83.5 kg (184 lb) IBW/kg (Calculated) : 50.1  Temp (24hrs), Avg:98.1 F (36.7 C), Min:97.6 F (36.4 C), Max:98.5 F (36.9 C)  Recent Labs  Lab 05/18/20 0900 05/18/20 1110 05/19/20 0731 05/19/20 2136 05/20/20 0021 05/20/20 0025 05/21/20 0318  WBC 4.7  --  12.6*  --   --  14.2* 12.3*  CREATININE 1.66*  --  1.71*  --   --  1.45* 1.53*  LATICACIDVEN 2.9* 1.5  --  0.9 0.8  --   --     Estimated Creatinine Clearance: 24 mL/min (A) (by C-G formula based on SCr of 1.53 mg/dL (H)).    Allergies  Allergen Reactions  . Atenolol Other (See Comments)    Other reaction(s): Other (See Comments) Decreased heart rate Decreased heart rate  . Codeine Shortness Of Breath    Rash, difficulty breathing, nausea.  . Losartan Other (See Comments)    Hyperkalemia  . Morphine And Related Shortness Of Breath    Rash, difficulty breathing, nausea.   . Fentanyl Other (See Comments)    hallucinations  . Nsaids Other (See Comments)    Other reaction(s): Unknown  . Rofecoxib Other (See Comments)    Other reaction(s): Unknown  . Sucralfate Other (See Comments)    Throat tightness  . Sulfa Antibiotics Other (See Comments)    Other reaction(s): Unknown  . Tramadol Other (See Comments)    lethargy    Antimicrobials this admission: vancomycin 3/25 >>  cefepime 3/25 >>   Microbiology results: 3/25 BCx: pending 3/25 UCx: NGTD 3/25 SARS CoV-2:  positive 3/25 influenza A/B: negative  3/25 MRSA PCR: Negative  Thank you for allowing pharmacy to be a part of this patient's care.  Oswald Hillock 05/21/2020 10:26 AM

## 2020-05-21 NOTE — Progress Notes (Signed)
   05/19/20 2057  Assess: MEWS Score  Temp 97.6 F (36.4 C)  BP (!) 78/38  Pulse Rate 72  Resp 16  SpO2 96 %  O2 Flow Rate (L/min) 1 L/min  Assess: MEWS Score  MEWS Temp 0  MEWS Systolic 2  MEWS Pulse 0  MEWS RR 0  MEWS LOC 0  MEWS Score 2  MEWS Score Color Yellow  Assess: if the MEWS score is Yellow or Red  Were vital signs taken at a resting state? Yes  Focused Assessment No change from prior assessment  Early Detection of Sepsis Score *See Row Information* High  MEWS guidelines implemented *See Row Information* Yes  Treat  MEWS Interventions Escalated (See documentation below);Administered scheduled meds/treatments (LR bolus given)  Pain Scale 0-10  Pain Score 0  Take Vital Signs  Increase Vital Sign Frequency  Yellow: Q 2hr X 2 then Q 4hr X 2, if remains yellow, continue Q 4hrs  Escalate  MEWS: Escalate Yellow: discuss with charge nurse/RN and consider discussing with provider and RRT  Notify: Charge Nurse/RN  Name of Charge Nurse/RN Company secretary, RN  Date Charge Nurse/RN Notified 05/19/20  Time Charge Nurse/RN Notified 2115  Notify: Provider  Provider Name/Title Sharion Settler  Date Provider Notified 05/19/20  Time Provider Notified 2115  Notification Type  (epic chat)  Notification Reason Other (Comment) (BP 78/38)  Document  Patient Outcome Other (Comment) (continue to monitor)  Inserted for Lauralee Evener RN

## 2020-05-21 NOTE — Evaluation (Addendum)
Occupational Therapy Evaluation Patient Details Name: Amy [name] MRN: [id number] DOB: [birth year] Today's Date: 05/21/2020    History of Present Illness Pt is a 87 yo female that presented to hspital due to abdominal pain, SOB, fever. Workup showed PNA, sepsis, elevated troponin (per cardiology demand ischemia); pt placed on heparin drip this admission. PMH: CAD, Cancer, dCHF c LVEF 60-65%, hypoTSH, stg-IV CKD, anemia of chronic disease, asthma, GERD, bilat knee OA, covid in January of 2022, has tested positive in Feb and March, R humeral head fx with sling.   Clinical Impression   Pt seen for OT/PT co-evaluation this date. Prior to this admission, pt was at rehab for 3 months working on stand pivot transfers, seated ADLs, and sit>stand LB dressing. Prior to rehab stay, pt lived alone in a one-level home with 5 steps and was MOD-I for ADLs and functional mobility of household distances. Upon arrival to room, pt asleep however easily awoken and agreeable to OT eval/tx. Daughter at bedside. Pt currently presents with decreased balance, strength, and activity tolerance, and requires MIN A for bed mobility (with HOB elevated), MIN A for stand pivot transfers with unilateral support on RW, MAX A for sit>stand toilet hygiene, and MAX A for seated LB dressing. Of note, pt received on RA with SpO2 86% while sitting EOB. SpO2 improved to 95% with seated rest break at EOB and [place] donned (2L); RN aware. Pt left with  donned and with physical therapy for seated therex. Pt would benefit from additional skilled OT services to maximize return to PLOF and minimize risk of future falls, injury, caregiver burden, and readmission. Upon discharge, recommend SNF services.      Follow Up Recommendations  SNF    Equipment Recommendations  Other (comment) (defer to next venue of care)       Precautions / Restrictions Precautions Precautions: Fall;Shoulder Shoulder Interventions: Don joy ultra  sling Restrictions Weight Bearing Restrictions: Yes RUE Weight Bearing: Non weight bearing      Mobility Bed Mobility Overal bed mobility: Needs Assistance Bed Mobility: Supine to Sit     Supine to sit: Min assist;HOB elevated     General bed mobility comments: light minA for trunk elevation    Transfers Overall transfer level: Needs assistance Equipment used: Rolling walker (2 wheeled) Transfers: Sit to/from Stand Sit to Stand: Min assist;+2 physical assistance         General transfer comment: able to perform with minA with RW, minA for steadying    Balance Overall balance assessment: Needs assistance Sitting-balance support: No upper extremity supported Sitting balance-Leahy Scale: Good Sitting balance - Comments: Good sitting balance at EOB during ADLs   Standing balance support: Single extremity supported;During functional activity Standing balance-Leahy Scale: Poor Standing balance comment: Requires steadying support and unilateral UE support on RW for standing peri-care                           ADL either performed or assessed with clinical judgement   ADL Overall ADL's : Needs assistance/impaired                 Upper Body Dressing : Maximal assistance;Sitting Upper Body Dressing Details (indicate cue type and reason): To adjust sling Lower Body Dressing: Maximal assistance;Sitting/lateral leans Lower Body Dressing Details (indicate cue type and reason): To don/doff socks     Toileting- Clothing Manipulation and Hygiene: Maximal assistance;Sit to/from stand Toileting - Clothing Manipulation Details (indicate cue type and  reason): MAX A for peri-care             Vision Baseline Vision/History: Retinopathy Patient Visual Report: Other (comment) (pt reports that she "received shots for retinopathy" prior to admission and that her vision has been getting worse)              Pertinent Vitals/Pain Pain Assessment: 0-10 Pain  Score: 8  Pain Location: chest pain Pain Descriptors / Indicators: Aching;Grimacing Pain Intervention(s): Limited activity within patient's tolerance;Monitored during session;Repositioned;Other (comment) (MD aware)     Hand Dominance Right   Extremity/Trunk Assessment Upper Extremity Assessment Upper Extremity Assessment: RUE deficits/detail;Generalized weakness RUE: Unable to fully assess due to immobilization   Lower Extremity Assessment Lower Extremity Assessment: Generalized weakness (able to lift against gravity)       Communication Communication Communication: No difficulties   Cognition Arousal/Alertness: Awake/alert Behavior During Therapy: WFL for tasks assessed/performed Overall Cognitive Status: Within Functional Limits for tasks assessed                                 General Comments: Pt agreeable to OT session. A&Ox2 (oriented to self, place, and year)              Home Living Family/patient expects to be discharged to:: Skilled nursing facility Living Arrangements: Other relatives Available Help at Discharge: Family;Available PRN/intermittently Type of Home: House Home Access: Stairs to enter Entrance Stairs-Number of Steps: 5 Entrance Stairs-Rails: Left Home Layout: One level     Bathroom Shower/Tub: Walk-in shower         Home Equipment: Environmental consultant - 2 wheels;Walker - 4 wheels;Cane - single point   Additional Comments: pt has been in SNF for last 3 months      Prior Functioning/Environment Level of Independence: Independent with assistive device(s)        Comments: Per chart review, pt was MOD-I for ADLs and functional mobility of household distances prior to rehab stay. Pt used a WC for longer community distances. During rehab stay, pt was able to participate in stand pivot transfers to/from [facility] and sit>stand dressing with assistance from rehab staff        OT Problem List: Decreased strength;Decreased range of  motion;Decreased activity tolerance;Impaired balance (sitting and/or standing);Decreased cognition;Decreased safety awareness;Decreased knowledge of use of DME or AE;Decreased knowledge of precautions      OT Treatment/Interventions: Self-care/ADL training;Therapeutic exercise;Energy conservation;DME and/or AE instruction;Therapeutic activities;Patient/family education;Balance training    OT Goals(Current goals can be found in the care plan section) Acute Rehab OT Goals Patient Stated Goal: to go home OT Goal Formulation: With patient Time For Goal Achievement: 06/04/20 Potential to Achieve Goals: Poor ADL Goals Pt Will Perform Grooming: with min guard assist;standing Pt Will Transfer to Toilet: with supervision;stand pivot transfer;bedside commode Pt Will Perform Toileting - Clothing Manipulation and hygiene: with min guard assist;sit to/from stand  OT Frequency: Min 1X/week    AM-PAC OT "6 Clicks" Daily Activity     Outcome Measure Help from another person eating meals?: None Help from another person taking care of personal grooming?: A Little Help from another person toileting, which includes using toliet, bedpan, or urinal?: A Lot Help from another person bathing (including washing, rinsing, drying)?: A Lot Help from another person to put on and taking off regular upper body clothing?: A Lot Help from another person to put on and taking off regular lower body clothing?: A Lot 6 Click Score:  15   End of Session Equipment Utilized During Treatment: Gait belt;Rolling walker Nurse Communication: Mobility status  Activity Tolerance: Patient tolerated treatment well Patient left: in chair;Other (comment) (with physical therapy)  OT Visit Diagnosis: Unsteadiness on feet (R26.81);Muscle weakness (generalized) (M62.81);History of falling (Z91.81)                Time: 2411-4643 OT Time Calculation (min): 28 min Charges:  OT General Charges $OT Visit: 1 Visit OT Evaluation $OT Eval  Moderate Complexity: 1 Mod OT Treatments $Self Care/Home Management : 8-22 mins  Fredirick Maudlin, OTR/L [place]

## 2020-05-21 NOTE — Progress Notes (Signed)
ANTICOAGULATION CONSULT NOTE  Pharmacy Consult for heparin infusion Indication: chest pain/ACS  Patient Measurements: Height: 5\' 2"  (157.5 cm) Weight: 83.5 kg (184 lb) IBW/kg (Calculated) : 50.1 Heparin Dosing Weight: 70 kg  Vital Signs: Temp: 98 F (36.7 C) (03/28 0500) Temp Source: Oral (03/27 2105) BP: 155/50 (03/28 0500) Pulse Rate: 66 (03/28 0500)  Labs: Recent Labs    05/18/20 0900 05/18/20 1110 05/18/20 1548 05/18/20 1606 05/18/20 1937 05/18/20 2200 05/19/20 0731 05/19/20 1746 05/20/20 0025 05/20/20 1232 05/20/20 1536 05/20/20 1939 05/21/20 0318  HGB 10.2*  --   --   --   --   --  8.0*  --  7.0*  --  7.6*  --  7.2*  HCT 33.6*  --   --   --   --   --  25.3*  --  22.6*  --  25.1*  --  23.2*  PLT 260  --   --   --   --   --  237  --  204  --   --   --  219  APTT 31  --   --   --   --   --   --   --   --   --   --   --   --   LABPROT 14.4  --   --   --   --   --   --   --   --   --   --   --   --   INR 1.2  --   --   --   --   --   --   --   --   --   --   --   --   HEPARINUNFRC  --   --   --   --   --    < > 0.29*   < > 0.25* 0.42  --  0.34 0.24*  CREATININE 1.66*  --   --   --   --   --  1.71*  --  1.45*  --   --   --  1.53*  TROPONINIHS 638*   < > 1,163* 1,196* 814*  --   --   --   --   --   --   --   --    < > = values in this interval not displayed.    Estimated Creatinine Clearance: 24 mL/min (A) (by C-G formula based on SCr of 1.53 mg/dL (H)).   Medical History: Past Medical History:  Diagnosis Date  . Bladder incontinence   . CAD (coronary artery disease)    a. 05/2009 Cath: LAD 90p (Xience 2.75 x 12 mm DES), 64m, D1 40, LCx 40p/m, RCA 30/40/30p/m, RCA 30/25d;  b.  12/2011 Lexiscan MV: no ischemia, breast attenuation artifact, normal EF-->Low risk; c. 10/2016 MV: fixed apical defect, most likely apical thinning and attenuation, No ischemia, EF 60%.  . Cancer ([place])    ovarian  . Carotid arterial disease w/ R Carotid Bruit (HCC)    a. 08/2016 Carotid  U/S: 40-59% bilat ICA stenosis - f/u 1 yr.  . Chronic diastolic (congestive) heart failure ([place])    a. Echo 09/2015: EF 55-60% w/ Grade 1 DD, sev Ca2+ MV annulus, mildly dil LA; b. 09/2016 Echo: EF 65-70%, Gr2 DD, mildly dil LA/RA, nl RV fxn.  . Chronic Dyspnea on exertion   . CKD (chronic kidney disease) stage 3, GFR 30-59 ml/min (HCC)   .  Degenerative arthritis of knee    bilateral knees  . Diabetes mellitus    Type II  . GIB (gastrointestinal bleeding)    a. 08/2016 Admission w/ presyncope/anemia/melena-->req Transfusion-->endo ok, colonoscopy w/ polyps but no source of bleeding (most likely diverticular).  . Hiatal hernia   . Hypertension   . Iron deficiency   . Menopausal symptoms   . Morbid obesity ([place])   . Renal insufficiency   . Thyroid disease    hypothyroidism    Assessment: 87 y.o. female with coronary disease, congestive heart failure, CKD who comes in for abdominal pain, shortness of breath and fever. Her Med Rec has been completed and she is noted to be on clopidogrel prior to admission and no other chronic antioagulation. H&H continue trending down, platelets wnl  Goal of Therapy:  Heparin level 0.3-0.7 units/ml Monitor platelets by anticoagulation protocol: Yes   3/28 0318 HL 0.24, subtherapeutic  Plan:   Give 1000 unit bolus x 1  Increase heparin infusion to 1600 units/hr  Recheck HL in 8 hours.  CBC daily while on IV heparin  Renda Rolls, PharmD, [facility] 05/21/2020 6:14 AM

## 2020-05-21 NOTE — Progress Notes (Signed)
Progress Note  Patient Name: Amy [name] Date of Encounter: 05/21/2020  Primary Cardiologist: Ida Rogue, MD   Subjective   Reports current chest pain that has been ongoing since she broke her arm in January. States she had this CP when seen earlier by our team.  Chest pain is rated 8/10 and worse with deeper breathing and movement. She suspects chest pain may also be exacerbated by frequent coughing in the setting of pneumonia.  She does not feel it is similar to before her stents.   Reports ongoing shortness of breath, though improved since admission.  She is on [place] O2 but does not wear oxygen at home.  She has been doing well with weaning of oxygen, though she did have oxygen saturations dropped during physical therapy earlier today.  She reports a cough with dark red sputum.  She reports chronic stomach pain with eating.  She is able to eat breakfast and lunch.  This is the first day that she has been able to sit up in the chair next to the bed.  Reports low urine output, dark urine.  Inpatient Medications    Scheduled Meds: . amitriptyline  10 mg Oral QHS  . clopidogrel  75 mg Oral Daily  . dextromethorphan-guaiFENesin  1 tablet Oral BID  . ipratropium-albuterol  3 mL Nebulization BID  . iron polysaccharides  150 mg Oral Daily  . levETIRAcetam  250 mg Oral BID  . levothyroxine  88 mcg Oral Q0600  . mometasone-formoterol  2 puff Inhalation BID  . montelukast  10 mg Oral QHS  . pantoprazole  20 mg Oral Daily  . pregabalin  100 mg Oral QHS  . pregabalin  50 mg Oral Daily  . simvastatin  40 mg Oral QHS  . sodium chloride flush  10-40 mL Intracatheter Q12H   Continuous Infusions: . ceFEPime (MAXIPIME) IV 2 g (05/21/20 0939)  . heparin 1,600 Units/hr (05/21/20 1916)   PRN Meds: acetaminophen, albuterol, docusate sodium, LORazepam, melatonin, nitroGLYCERIN, ondansetron (ZOFRAN) IV, polyethylene glycol, sodium chloride flush, traMADol   Vital Signs    Vitals:    05/21/20 0500 05/21/20 0751 05/21/20 1042 05/21/20 1216  BP: (!) 155/50 (!) 155/56  (!) 151/50  Pulse: 66 70 66 63  Resp: 18   16  Temp: 98 F (36.7 C) 97.9 F (36.6 C)  97.7 F (36.5 C)  TempSrc:  Oral  Oral  SpO2: 98% 98% 98% 98%  Weight:      Height:        Intake/Output Summary (Last 24 hours) at 05/21/2020 1233 Last data filed at 05/21/2020 6060 Gross per 24 hour  Intake 298.12 ml  Output 900 ml  Net -601.88 ml   Last 3 Weights 05/20/2020 05/18/2020 05/01/2020  Weight (lbs) 184 lb 190 lb 175 lb  Weight (kg) 83.462 kg 86.183 kg 79.379 kg  Some encounter information is confidential and restricted. Go to Review Flowsheets activity to see all data.      Telemetry    NSR with first-degree AV block.- Personally Reviewed  ECG    No new tracings- Personally Reviewed  Physical Exam   GEN: No acute distress.  Joined by her daughter.  Appears fatigued. Neck: No JVD Cardiac: RRR, no murmurs, rubs, or gallops.  Respiratory:  Bilateral rhonchi throughout anterior auscultation only. GI: Soft, nontender, non-distended  MS: No edema; No deformity.  Right arm in cast. Neuro:  Nonfocal  Psych: Normal affect   Labs    High Sensitivity Troponin:  Recent Labs  Lab 05/18/20 0900 05/18/20 1110 05/18/20 1548 05/18/20 1606 05/18/20 1937  TROPONINIHS 638* 933* 1,163* 1,196* 814*      Chemistry Recent Labs  Lab 05/18/20 0900 05/19/20 0731 05/20/20 0025 05/21/20 0318  NA 142 140 137 141  K 4.6 3.6 3.4* 4.1  CL 108 108 107 110  CO2 25 26 23 24   GLUCOSE 119* 93 99 101*  BUN 34* 40* 42* 43*  CREATININE 1.66* 1.71* 1.45* 1.53*  CALCIUM 7.7* 7.5* 7.6* 7.8*  PROT 5.6*  --   --   --   ALBUMIN 3.0*  --   --   --   AST 19  --   --   --   ALT 8  --   --   --   ALKPHOS 80  --   --   --   BILITOT 0.9  --   --   --   GFRNONAA 29* 28* 34* 32*  ANIONGAP 9 6 7 7      Hematology Recent Labs  Lab 05/19/20 0731 05/20/20 0025 05/20/20 1536 05/21/20 0318  WBC 12.6* 14.2*   --  12.3*  RBC 2.61* 2.31*  --  2.39*  HGB 8.0* 7.0* 7.6* 7.2*  HCT 25.3* 22.6* 25.1* 23.2*  MCV 96.9 97.8  --  97.1  MCH 30.7 30.3  --  30.1  MCHC 31.6 31.0  --  31.0  RDW 15.7* 15.7*  --  15.3  PLT 237 204  --  219    BNP Recent Labs  Lab 05/18/20 1110  BNP 748.3*     DDimer No results for input(s): DDIMER in the last 168 hours.   Radiology    No results found.  Cardiac Studies   Echo 02/07/20 1. Left ventricular ejection fraction, by estimation, is 55 to 60%. The  left ventricle has normal function. The left ventricle has no regional  wall motion abnormalities. There is mild left ventricular hypertrophy.  Left ventricular diastolic parameters  are consistent with Grade II diastolic dysfunction (pseudonormalization).  Elevated left atrial pressure.  2. Right ventricular systolic function is normal. The right ventricular  size is mildly enlarged. There is mildly elevated pulmonary artery  systolic pressure.  3. Left atrial size was mildly dilated.  4. Right atrial size was mildly dilated.  5. The mitral valve is abnormal. Trivial mitral valve regurgitation. Mild  to moderate mitral stenosis. Severe mitral annular calcification.  6. The aortic valve is tricuspid. There is mild calcification of the  aortic valve. There is mild thickening of the aortic valve. Aortic valve  regurgitation is trivial. Mild aortic valve stenosis. Aortic valve mean  gradient measures 12.0 mmHg.  7. The inferior vena cava is normal in size with <50% respiratory  variability, suggesting right atrial pressure of 8 mmHg.    Lexiscan MPI 04/23/2017: Pharmacological myocardial perfusion imaging study with no significant Ischemia Small region of fixed apical thinning, consistent with attenuation artifact GI uptake artifact noted Normal wall motion, EF estimated at 56% No EKG changes concerning for ischemia at peak stress or in recovery. Low risk scan   Patient Profile     87  y.o. female with history of CAD s/p prior LAD stenting in 2011, HFpEF, carotid artery disease, GI bleed, frequent falls, DM, CKD stage IV, HTN with RAS s/tp renal artery stenting in 01/2019, HLD, obesity, hypothyroidism, anemia of chronic disease/iron deficiency anemia, shingles,  COPD, and admitted with pna and elevated HS Tn.  Assessment & Plan  NSTEMI Known CAD s/p LAD stenting 2011 --Reports current chest pain that has been ongoing since January and breaking her right arm.  Chest pain is pleuritic and positional, most consistent with MSK etiology.  She feels that her chest pain has become worse with frequent coughing and recent red sputum.  High-sensitivity troponin peaked 1,196 and downtrending.  She does have history of CAD s/p cath in 2011 as above.  Echo 01/2020 with normal EF, NRWMA.  2019 stress ruled low risk.  As previously indicated, consider to supply demand ischemia in the setting of her current pneumonia and sepsis, as well as her AKI.  Cannot completely rule out cardiac etiology given risk factors, including previous history of GIB. Not an ideal catheterization candidate, however, and as below.  Consider repeat echo/limited echo to reassess EF, wall motion.    Discontinued IV heparin, as she has completed 72 hours of IV heparin and is reporting dark red sputum with H&H drop noted on labs.    Continue current medications.    Caution with antiplatelets given recent H&H.    Caution with ACE/ARB/Arni due to AKI.    Avoid beta-blockers due to history of bradycardia/dizziness/syncope on discussed medications.    Daily CBC, BMET  No plan for invasive ischemic workup. Not an ideal cardiac catheterization candidate, given her comorbid conditions including AOCKD, PNA, and anemia.  HTN --SBP suboptimal in the 150s today. Monitor closely.  Started amlodipine 2.5 mg daily.  Caution with ACE/ARB/Arni due to AKI.  Home lisinopril 2.5 mg daily and torsemide 20 mg daily held at  presentation.  We will continue to hold given reduced urine output and sepsis with AKI at presentation.  Monitor volume status closely as below.  Not on a beta-blocker due to history of bradycardia/dizziness/syncope on this class of medications.  PAF not on [place] --Currently NSR and rate controlled in the 60s.  Not on beta-blocker due to history of bradycardia/dizziness/syncope.  Not on home anticoagulation due to risk of bleeding due to frequent falls.  Given current anemia with drop in Hgb and high risk of bleeding due to history of falls and GIB, would continue to defer any anticoagulation at this time.  HFpEF --Shortness of breath in the setting of pneumonia.  Denies any other signs or symptoms of volume overload.  Previous echo 12/21 as above.  Could consider repeat limited echo as above.  Euvolemic on exam.  Home torsemide held for AKI, as well as ACE.  Not on a beta-blocker due to history of bradycardia/dizziness/syncope. Given sepsis and euvolemic, continue to hold ACE/torsemide for now. Monitor I/Os, daily wts. +657.1 yesterday, wt 86.2kg  83.5kg. Started amlodipine for now for BP. Monitor volume status closely and restart diuresis if increased volume with stable Cr.   NSVT --No further SVT seen on telemetry. Rates controlled. Earlier run of 10 beats of SVT.  Not on beta-blocker due to history of bradycardia/dizziness/syncope.  Pna Sepsis --Presented with pna and sepsis. Continue tx / abx per IM.   AOCKD --Cr 1.53 with BUN 43. Baseline Cr appears labile and between 1.4-1.8.Mar[place] Kitchen Daily BMET.  Caution with nephrotoxins.  AOC anemia of chronic dz with history of GIB Reported hemoptysis --Hgb 7.2, HCT 23.2.  Discontinued IV heparin, as it has been 72 hours.. Daily CBC.  As above, caution with antiplatelets and anticoagulation.  Transfuse for hemoglobin less than 8.0.  HLD --LDL 36.  Continue statin.   For questions or updates, please contact [place] Please consult [email or web address] for  contact info  under        Signed, Arvil Chaco, PA-C  05/21/2020, 12:33 PM

## 2020-05-21 NOTE — Progress Notes (Signed)
PT Cancellation Note  Patient Details Name: Amy [name] MRN: [id number] DOB: [birth year]   Cancelled Treatment:    Reason Eval/Treat Not Completed: Other (comment). Pt with MD in room at this time, PT to re-attempt as able.   Lieutenant Diego PT, DPT 10:08 AM,05/21/20

## 2020-05-21 NOTE — Evaluation (Signed)
Physical Therapy Evaluation Patient Details Name: Amy [name] MRN: [id number] DOB: [birth year] Today's Date: 05/21/2020   History of Present Illness  Pt is a 87 yo female that presented to hspital due to abdominal pain, SOB, fever. Workup showed PNA, sepsis, elevated troponin per cardiology demand ischemia; pt placed on heparin drip this admission. PMH: CAD, Cancer, dCHF c LVEF 60-65%, hypoTSH, stg-IV CKD, anemia of chronic disease, asthma, GERD, bilat knee OA, covid in January of 2022, has tested positive in Feb and March, R humeral head fx with sling.    Clinical Impression  Pt alert, agreeable to session, OT at bedside. Pt reported that she has been at rehab for the last 3 months and has been working on bed mobility and stand pivot transfers. Prior to rehab stay, pt lived alone with family available PRN, modI/I for ADLs.   The patient demonstrated supine to sit with minA for weight shifting assistance. Good sitting balance noted with OT. Able to sit EOB for several minutes to assess ADLs with OT, as well as spO2. O2 L n[place] donned due to desaturation to mid 80s. Returned to >90%. Sit <> Stand twice during session with RW and minA1-2 assist. Able to stand pivot with minA as well (especially with RW guidance) as well as statically stand to allow for pericare (maxA). Returned to sitting in recliner and performed exercises with visual and verbal cueing.  Overall the patient demonstrated deficits (see "PT Problem List") that impede the patient's functional abilities, safety, and mobility and would benefit from skilled PT intervention. Recommendation is SNF due to current level of assistance needed and to maximize function, safety, mobility, and independence.     Follow Up Recommendations SNF    Equipment Recommendations  None recommended by PT    Recommendations for Other Services       Precautions / Restrictions Precautions Precautions: Fall;Shoulder Shoulder Interventions: Don joy  ultra sling Restrictions Weight Bearing Restrictions: Yes RUE Weight Bearing: Non weight bearing      Mobility  Bed Mobility Overal bed mobility: Needs Assistance Bed Mobility: Supine to Sit     Supine to sit: Min assist;HOB elevated     General bed mobility comments: light minA for trunk elevation    Transfers Overall transfer level: Needs assistance Equipment used: Rolling walker (2 wheeled) Transfers: Sit to/from Stand Sit to Stand: Min assist;+2 physical assistance         General transfer comment: able to perform with minA with RW, minA for steadying  Ambulation/Gait Ambulation/Gait assistance: Min assist Gait Distance (Feet): 2 Feet Assistive device: Rolling walker (2 wheeled) Gait Pattern/deviations: Step-to pattern     General Gait Details: pt able to stand pivot to recliner in room wiht minA, x2 for safety and RW assist  Stairs            Wheelchair Mobility    Modified Rankin (Stroke Patients Only)       Balance Overall balance assessment: Needs assistance Sitting-balance support: Feet supported Sitting balance-Leahy Scale: Good       Standing balance-Leahy Scale: Poor Standing balance comment: reliant on UE support, able to statically stand with CGA for pericare                             Pertinent Vitals/Pain Pain Assessment: Faces Pain Score: 8  Pain Location: chest pain Pain Descriptors / Indicators: Aching;Grimacing Pain Intervention(s): Limited activity within patient's tolerance;Monitored during session;Repositioned (MD aware)  Home Living Family/patient expects to be discharged to:: Private residence Living Arrangements: Other relatives Available Help at Discharge: Family;Available PRN/intermittently Type of Home: House Home Access: Stairs to enter Entrance Stairs-Rails: Left Entrance Stairs-Number of Steps: 5 Home Layout: One level Home Equipment: Walker - 2 wheels;Walker - 4 wheels;Cane - single  point Additional Comments: in SNF for last 4 months    Prior Function Level of Independence: Independent with assistive device(s)         Comments: Prior to rehab stay pt was modI per chart for ADLs, ambulation in home, WC for longer community distances     Hand Dominance   Dominant Hand: Right    Extremity/Trunk Assessment   Upper Extremity Assessment Upper Extremity Assessment: Defer to OT evaluation    Lower Extremity Assessment Lower Extremity Assessment: Generalized weakness (able to lift against gravity)       Communication   Communication: No difficulties  Cognition Arousal/Alertness: Awake/alert Behavior During Therapy: WFL for tasks assessed/performed Overall Cognitive Status: Within Functional Limits for tasks assessed                                 General Comments: oriented to year, but thought it was beginning of april      General Comments      Exercises Other Exercises Other Exercises: seated LAQ, marches, ankle DF/PF, hip abduction against PT resistance, hip adduciton squeezing pillow x20 ea   Assessment/Plan    PT Assessment Patient needs continued PT services (oxygen needs)  PT Problem List Decreased strength;Decreased range of motion;Decreased activity tolerance;Decreased balance;Decreased mobility;Decreased knowledge of precautions       PT Treatment Interventions DME instruction;Balance training;Gait training;Neuromuscular re-education;Stair training;Functional mobility training;Patient/family education;Therapeutic activities;Therapeutic exercise;Wheelchair mobility training    PT Goals (Current goals can be found in the Care Plan section)  Acute Rehab PT Goals Patient Stated Goal: to go home PT Goal Formulation: With patient Time For Goal Achievement: 06/04/20 Potential to Achieve Goals: Good    Frequency Min 2X/week   Barriers to discharge Decreased caregiver support;Inaccessible home environment       Co-evaluation               AM-PAC PT "6 Clicks" Mobility  Outcome Measure Help needed turning from your back to your side while in a flat bed without using bedrails?: A Little Help needed moving from lying on your back to sitting on the side of a flat bed without using bedrails?: A Little Help needed moving to and from a bed to a chair (including a wheelchair)?: A Little Help needed standing up from a chair using your arms (e.g., wheelchair or bedside chair)?: A Little Help needed to walk in hospital room?: A Lot Help needed climbing 3-5 steps with a railing? : Total 6 Click Score: 15    End of Session Equipment Utilized During Treatment: Gait belt Activity Tolerance: Patient tolerated treatment well Patient left: in chair;with family/visitor present;with call bell/phone within reach;with chair alarm set Nurse Communication: Mobility status PT Visit Diagnosis: Muscle weakness (generalized) (M62.81);Other abnormalities of gait and mobility (R26.89)    Time: 9924-2683 PT Time Calculation (min) (ACUTE ONLY): 35 min   Charges:   PT Evaluation $PT Eval Moderate Complexity: 1 Mod PT Treatments $Therapeutic Exercise: 8-22 mins        Lieutenant Diego PT, DPT 1:08 PM,05/21/20

## 2020-05-21 NOTE — Progress Notes (Signed)
OT Cancellation Note  Patient Details Name: Amy [name] MRN: [id number] DOB: Mar 15, 1927   Cancelled Treatment:    Reason Eval/Treat Not Completed: Patient at procedure or test/ unavailable. Orders received and chart reviewed. Upon arrival to room, pt with MD. OT to re-attempt as able.  Fredirick Maudlin, OTR/L [place]

## 2020-05-22 ENCOUNTER — Inpatient Hospital Stay: Payer: Medicare Other

## 2020-05-22 ENCOUNTER — Encounter: Payer: Self-pay | Admitting: Internal Medicine

## 2020-05-22 DIAGNOSIS — J189 Pneumonia, unspecified organism: Secondary | ICD-10-CM | POA: Diagnosis not present

## 2020-05-22 DIAGNOSIS — R778 Other specified abnormalities of plasma proteins: Secondary | ICD-10-CM | POA: Diagnosis not present

## 2020-05-22 DIAGNOSIS — I214 Non-ST elevation (NSTEMI) myocardial infarction: Secondary | ICD-10-CM | POA: Diagnosis not present

## 2020-05-22 DIAGNOSIS — N184 Chronic kidney disease, stage 4 (severe): Secondary | ICD-10-CM | POA: Diagnosis not present

## 2020-05-22 LAB — COMPREHENSIVE METABOLIC PANEL
ALT: 8 U/L (ref 0–44)
AST: 12 U/L — ABNORMAL LOW (ref 15–41)
Albumin: 2.2 g/dL — ABNORMAL LOW (ref 3.5–5.0)
Alkaline Phosphatase: 64 U/L (ref 38–126)
Anion gap: 6 (ref 5–15)
BUN: 41 mg/dL — ABNORMAL HIGH (ref 8–23)
CO2: 23 mmol/L (ref 22–32)
Calcium: 7.9 mg/dL — ABNORMAL LOW (ref 8.9–10.3)
Chloride: 110 mmol/L (ref 98–111)
Creatinine, Ser: 1.49 mg/dL — ABNORMAL HIGH (ref 0.44–1.00)
GFR, Estimated: 33 mL/min — ABNORMAL LOW (ref >60)
Glucose, Bld: 109 mg/dL — ABNORMAL HIGH (ref 70–99)
Potassium: 4.7 mmol/L (ref 3.5–5.1)
Sodium: 139 mmol/L (ref 135–145)
Total Bilirubin: 0.6 mg/dL (ref 0.3–1.2)
Total Protein: 4.7 g/dL — ABNORMAL LOW (ref 6.5–8.1)

## 2020-05-22 LAB — TSH: TSH: 12.221 u[IU]/mL — ABNORMAL HIGH (ref 0.350–4.500)

## 2020-05-22 LAB — CBC
HCT: 27.2 % — ABNORMAL LOW (ref 36.0–46.0)
HCT: 24.3 % — ABNORMAL LOW (ref 36.0–46.0)
Hemoglobin: 8.3 g/dL — ABNORMAL LOW (ref 12.0–15.0)
Hemoglobin: 7.3 g/dL — ABNORMAL LOW (ref 12.0–15.0)
MCH: 30.1 pg (ref 26.0–34.0)
MCH: 30.2 pg (ref 26.0–34.0)
MCHC: 30.5 g/dL (ref 30.0–36.0)
MCHC: 30 g/dL (ref 30.0–36.0)
MCV: 98.6 fL (ref 80.0–100.0)
MCV: 100.4 fL — ABNORMAL HIGH (ref 80.0–100.0)
Platelets: 221 10*3/uL (ref 150–400)
Platelets: 200 10*3/uL (ref 150–400)
RBC: 2.76 MIL/uL — ABNORMAL LOW (ref 3.87–5.11)
RBC: 2.42 MIL/uL — ABNORMAL LOW (ref 3.87–5.11)
RDW: 15.2 % (ref 11.5–15.5)
RDW: 15.2 % (ref 11.5–15.5)
WBC: 10 10*3/uL (ref 4.0–10.5)
WBC: 10 10*3/uL (ref 4.0–10.5)
nRBC: 0 % (ref 0.0–0.2)
nRBC: 0 % (ref 0.0–0.2)

## 2020-05-22 LAB — BLOOD GAS, VENOUS
Acid-base deficit: 1.6 mmol/L (ref 0.0–2.0)
Bicarbonate: 25.4 mmol/L (ref 20.0–28.0)
O2 Saturation: 88 %
Patient temperature: 37
pCO2, Ven: 54 mmHg (ref 44.0–60.0)
pH, Ven: 7.28 (ref 7.250–7.430)
pO2, Ven: 62 mmHg — ABNORMAL HIGH (ref 32.0–45.0)

## 2020-05-22 LAB — PREPARE RBC (CROSSMATCH)

## 2020-05-22 LAB — BRAIN NATRIURETIC PEPTIDE: B Natriuretic Peptide: 939.2 pg/mL — ABNORMAL HIGH (ref 0.0–100.0)

## 2020-05-22 LAB — HEMOGLOBIN AND HEMATOCRIT, BLOOD
HCT: 28.4 % — ABNORMAL LOW (ref 36.0–46.0)
Hemoglobin: 8.8 g/dL — ABNORMAL LOW (ref 12.0–15.0)

## 2020-05-22 LAB — TROPONIN I (HIGH SENSITIVITY)
Troponin I (High Sensitivity): 260 ng/L (ref <18)
Troponin I (High Sensitivity): 200 ng/L (ref <18)

## 2020-05-22 LAB — D-DIMER, QUANTITATIVE: D-Dimer, Quant: 1.99 ug/mL-FEU — ABNORMAL HIGH (ref 0.00–0.50)

## 2020-05-22 LAB — MAGNESIUM: Magnesium: 1.8 mg/dL (ref 1.7–2.4)

## 2020-05-22 MED ORDER — FUROSEMIDE 10 MG/ML IJ SOLN
INTRAMUSCULAR | Status: AC
  Filled 2020-05-22: qty 6

## 2020-05-22 MED ORDER — FUROSEMIDE 10 MG/ML IJ SOLN
60.0000 mg | Freq: Once | INTRAMUSCULAR | Status: AC
  Administered 2020-05-22: 60 mg via INTRAVENOUS

## 2020-05-22 MED ORDER — VANCOMYCIN HCL 750 MG/150ML IV SOLN: 750.0000 mg | INTRAVENOUS | Status: DC

## 2020-05-22 MED ORDER — TRAMADOL HCL 50 MG PO TABS
50.0000 mg | ORAL_TABLET | Freq: Two times a day (BID) | ORAL | Status: DC | PRN
  Administered 2020-05-22 – 2020-06-01 (×10): 50 mg via ORAL
  Filled 2020-05-22 (×11): qty 1

## 2020-05-22 MED ORDER — SODIUM CHLORIDE 0.9 % IV SOLN: INTRAVENOUS | Status: DC

## 2020-05-22 MED ORDER — SODIUM CHLORIDE 0.9% IV SOLUTION: Freq: Once | INTRAVENOUS | Status: AC

## 2020-05-22 MED ORDER — LEVOTHYROXINE SODIUM 100 MCG/5ML IV SOLN: 50.0000 ug | Freq: Every day | INTRAVENOUS | Status: DC

## 2020-05-22 MED ORDER — LEVOTHYROXINE SODIUM 100 MCG PO TABS
100.0000 ug | ORAL_TABLET | Freq: Every day | ORAL | Status: DC
  Administered 2020-05-22 – 2020-06-03 (×13): 100 ug via ORAL
  Filled 2020-05-22 (×13): qty 1

## 2020-05-22 MED ORDER — FUROSEMIDE 10 MG/ML IJ SOLN: 80.0000 mg | Freq: Once | INTRAMUSCULAR | Status: DC

## 2020-05-22 MED ORDER — CHLORHEXIDINE GLUCONATE CLOTH 2 % EX PADS
6.0000 | MEDICATED_PAD | Freq: Every day | CUTANEOUS | Status: AC
  Administered 2020-05-22 – 2020-05-26 (×5): 6 via TOPICAL

## 2020-05-22 MED ORDER — SODIUM CHLORIDE 0.9 % IV SOLN
250.0000 mg | Freq: Two times a day (BID) | INTRAVENOUS | Status: DC
  Administered 2020-05-22 (×2): 250 mg via INTRAVENOUS
  Filled 2020-05-22 (×5): qty 2.5

## 2020-05-22 MED ORDER — LEVOTHYROXINE SODIUM 100 MCG PO TABS
100.0000 ug | ORAL_TABLET | Freq: Every day | ORAL | Status: DC
  Filled 2020-05-22: qty 1

## 2020-05-22 MED ORDER — PIPERACILLIN-TAZOBACTAM 3.375 G IVPB
3.3750 g | Freq: Three times a day (TID) | INTRAVENOUS | Status: DC
  Administered 2020-05-22 – 2020-05-24 (×7): 3.375 g via INTRAVENOUS
  Filled 2020-05-22 (×7): qty 50

## 2020-05-22 MED ORDER — LEVOTHYROXINE SODIUM 100 MCG/5ML IV SOLN
100.0000 ug | Freq: Every day | INTRAVENOUS | Status: DC
  Filled 2020-05-22 (×2): qty 5

## 2020-05-22 MED ORDER — PANTOPRAZOLE SODIUM 40 MG IV SOLR
40.0000 mg | Freq: Every day | INTRAVENOUS | Status: DC
  Administered 2020-05-22: 40 mg via INTRAVENOUS
  Filled 2020-05-22 (×2): qty 40

## 2020-05-22 MED ORDER — TRAMADOL HCL 50 MG PO TABS: 50.0000 mg | ORAL_TABLET | Freq: Four times a day (QID) | ORAL | Status: DC | PRN

## 2020-05-22 MED ORDER — MUPIROCIN 2 % EX OINT
1.0000 "application " | TOPICAL_OINTMENT | Freq: Two times a day (BID) | CUTANEOUS | Status: AC
  Administered 2020-05-22 – 2020-05-26 (×8): 1 via NASAL
  Filled 2020-05-22: qty 22

## 2020-05-22 MED ORDER — VANCOMYCIN HCL 2000 MG/400ML IV SOLN
2000.0000 mg | Freq: Once | INTRAVENOUS | Status: AC
  Administered 2020-05-22: 2000 mg via INTRAVENOUS
  Filled 2020-05-22: qty 400

## 2020-05-22 NOTE — Progress Notes (Signed)
PROGRESS NOTE   HPI was taken from Dr. Blaine Hamper: Amy [name] is a 87 y.o. female with medical history significant of HTN, HLD, DM, COPD, CAD with DES, hypothyroidism, A fib not on AC, seizure, CKD-IV, anemia, dCHF, GIB, covid19 infection 03/06/20, presents with SOB and abdominal pain.  Patient states that she started having abdominal pain since yesterday, which is located in the left side of the abdomen, associated with nausea and diarrhea.  She has had 3-4 times of loose stool bowel movement.  She also had fever of 101 in facility, currently body temperature is 97.5 in ED. Patient has cough and shortness of breath which has been progressively worsening.  She was found to have oxygen desaturation to 85% on room air, which improved to 94% on 5 L oxygen.  Denies chest pain.  No rectal bleeding or dark stool.    ED Course: pt was found to have WBC 4.7, lactic acid 2.9, 1.5, INR 1.1, PTT 31, troponin level 638, 933, positive Covid PCR, renal function close to baseline, temperature 97.5, softer blood pressure 93/39, heart rate 98, RR 28.  Chest x-ray showed multifocal infiltration mainly in the left side, vascular congestion and cardiomegaly.  CT of the chest/abdomen/pelvis showed left lower lobe infiltration,  no acute issues intra-abdominally. Pt is admitted to progressive bed as inpatient.  Dr. Rockey Situ of card is consulted.  Hospital Course from Dr. Jimmye Norman 3/26-3/29/22: Pt was found to have pneumonia and NSTEMI. Pt was initially treated w/ IV cefepime for pneumonia and then switched to IV zosyn today as repeat CXR today shows worsening pneumonia. Pt was put on BiPAP overnight for worsening respiratory status secondary to pulmonary edema & worsening pneumonia. BiPAP was able to be weaned off. Of note, pt's NSTEMI has been treated medically as pt is not currently candidate for cardiac cath as per cardio.     Amy [name]  ASN:[id number] DOB: 11-02-1927 DOA: 05/18/2020 PCP: Alvester Morin,  MD   Assessment & Plan:   Principal Problem:   HCAP (healthcare-associated pneumonia) Active Problems:   Acquired hypothyroidism   HLD (hyperlipidemia)   Essential hypertension   Iron deficiency anemia   Chronic diastolic CHF (congestive heart failure) (HCC)   COPD with chronic bronchitis (HCC)   Severe sepsis (HCC)   CKD (chronic kidney disease) stage 4, GFR 15-29 ml/min (HCC)   HTN (hypertension)   GERD (gastroesophageal reflux disease)   CAD (coronary artery disease)   Non-ST elevation (NSTEMI) myocardial infarction [facility])   Atrial fibrillation (HCC)   Seizure (HCC)   Elevated troponin   Abdominal pain   Diarrhea   Acute respiratory failure with hypoxia (HCC)  Severe sepsis: secondary to HCAP. Meets critieria w/ tachycardia, tachypnea, elevated lactic acid. Had a Covid infection 03/06/2020, COVID19 PCR still positive. No longer needs isolation. Will not treat COVID19. D/c cefepime and start zosyn . Continue on bronchodilators and encourage incentive spirometry. Resolved   HCAP: d/c IV cefepime and start zosyn as repeat CXR shows worsening pneumonia. Continue on bronchodilators, mucinex, & encourage incentive spirometry. Legionella is neg    NSTEMI: troponins >1,000 but trending down. IV heparin drip d/c yesterday as per cardio. Nitro prn. Currently not a candidate for cardiac cath as per cardio  Acute hypoxic respiratory failure: likely secondary to above. Continue on supplemental oxygen and wean as tolerated.   Chronic right shoulder pain: continue on tramadol prn   Leukocytosis: resolved  Hypothyroidism: continue on levothyroxine   HLD: continue on statin   HTN: continue on  amlodipine as per cardio   IDA: continue on iron supplement. Will transfuse 1 unit of pRBCs today for symptomatic anemia. Will continue to monitor H&H   Chronic diastolic CHF: s/p IV lasix x1. Monitor I/Os. Echo on 02/07/2020 showed EF of 55 to 60%, grade II diastolic dysfunction.  COPD:  w/o exacerbation. Continue on bronchodilators and encourage incentive spirometry   CKDIV: Cr is trending down today. Avoid nephrotoxic meds   Hypokalemia: within normal limits today   Hypomagnesemia: within normal limits today   GERD: continue on pantoprazole   CAD: continue on statin, plavix. Continue on tele   A. fib: likely PAF. Not on anticoagulation likely secondary to high fall risk   Seizures: continue on home dose of keppra. Seizure precautions     Abdominal pain and diarrhea: resolved      DVT prophylaxis: SCDs  Code Status: DNR Family Communication: discussed pt's care w/ pt's daughter, [name] and and answered her questions  Disposition Plan:  Therapy recs SNF   Level of care: Progressive Cardiac   Status is: Inpatient  Remains inpatient appropriate because:Unsafe d/c plan, IV treatments appropriate due to intensity of illness or inability to take PO and Inpatient level of care appropriate due to severity of illness   Dispo: The patient is from: SNF              Anticipated d/c is to: SNF              Patient currently is not medically stable to d/c.   Difficult to place patient Yes     Consultants:   Cardio    Procedures:  Antimicrobials:   Subjective: Pt c/o shoulder pain   Objective: Vitals:   05/22/20 0146 05/22/20 0317 05/22/20 0423 05/22/20 0504  BP: (!) 112/53 (!) 92/35 (!) 103/30 (!) 108/38  Pulse: 60 (!) 59 (!) 54 (!) 58  Resp: 12 13 13 16   Temp: 98.4 F (36.9 C)   97.8 F (36.6 C)  TempSrc: Axillary   Oral  SpO2: 100% 100% 100% 99%  Weight:    82.5 kg  Height:        Intake/Output Summary (Last 24 hours) at 05/22/2020 0813 Last data filed at 05/22/2020 0600 Gross per 24 hour  Intake 1052.45 ml  Output 1225 ml  Net -172.55 ml   Filed Weights   05/18/20 0853 05/20/20 0344 05/22/20 0504  Weight: 86.2 kg 83.5 kg 82.5 kg    Examination:  General exam: Appears uncomfortable. Frail appearing  Respiratory system: decreased  breath sounds b/l  Cardiovascular system: S1/S2+. No rubs or clicks   Gastrointestinal system: Abd is soft, NT, obese & hypoactive bowel sounds Central nervous system: Lethargic. Psychiatry: Judgement and insight appear abnormal. Agitated     Data Reviewed: I have personally reviewed following labs and imaging studies  CBC: Recent Labs  Lab 05/18/20 0900 05/19/20 0731 05/20/20 0025 05/20/20 1536 05/21/20 0318 05/21/20 1940 05/22/20 0052 05/22/20 0244  WBC 4.7 12.6* 14.2*  --  12.3*  --  10.0 10.0  NEUTROABS 3.7  --   --   --   --   --   --   --   HGB 10.2* 8.0* 7.0* 7.6* 7.2* 8.3* 8.3* 7.3*  HCT 33.6* 25.3* 22.6* 25.1* 23.2* 25.5* 27.2* 24.3*  MCV 98.2 96.9 97.8  --  97.1  --  98.6 100.4*  PLT 260 237 204  --  219  --  221 097   Basic Metabolic Panel: Recent Labs  Lab 05/18/20 0900 05/19/20 0731 05/20/20 0025 05/20/20 0457 05/21/20 0318 05/22/20 0244  NA 142 140 137  --  141 139  K 4.6 3.6 3.4*  --  4.1 4.7  CL 108 108 107  --  110 110  CO2 25 26 23   --  24 23  GLUCOSE 119* 93 99  --  101* 109*  BUN 34* 40* 42*  --  43* 41*  CREATININE 1.66* 1.71* 1.45*  --  1.53* 1.49*  CALCIUM 7.7* 7.5* 7.6*  --  7.8* 7.9*  MG  --   --   --  1.4* 1.9 1.8  PHOS  --   --   --  3.6  --   --    GFR: Estimated Creatinine Clearance: 24.5 mL/min (A) (by C-G formula based on SCr of 1.49 mg/dL (H)). Liver Function Tests: Recent Labs  Lab 05/18/20 0900 05/22/20 0244  AST 19 12*  ALT 8 8  ALKPHOS 80 64  BILITOT 0.9 0.6  PROT 5.6* 4.7*  ALBUMIN 3.0* 2.2*   No results for input(s): LIPASE, AMYLASE in the last 168 hours. No results for input(s): AMMONIA in the last 168 hours. Coagulation Profile: Recent Labs  Lab 05/18/20 0900  INR 1.2   Cardiac Enzymes: No results for input(s): CKTOTAL, CKMB, CKMBINDEX, TROPONINI in the last 168 hours. BNP (last 3 results) No results for input(s): PROBNP in the last 8760 hours. HbA1C: No results for input(s): HGBA1C in the last 72  hours. CBG: Recent Labs  Lab 05/18/20 2154 05/21/20 2354  GLUCAP 125* 115*   Lipid Profile: No results for input(s): CHOL, HDL, LDLCALC, TRIG, CHOLHDL, LDLDIRECT in the last 72 hours. Thyroid Function Tests: Recent Labs    05/22/20 0052  TSH 12.221*   Anemia Panel: No results for input(s): VITAMINB12, FOLATE, FERRITIN, TIBC, IRON, RETICCTPCT in the last 72 hours. Sepsis Labs: Recent Labs  Lab 05/18/20 0900 05/18/20 1110 05/19/20 0731 05/19/20 2136 05/20/20 0021 05/20/20 0025  PROCALCITON 5.26  --  14.80  --   --  8.46  LATICACIDVEN 2.9* 1.5  --  0.9 0.8  --     Recent Results (from the past 240 hour(s))  Blood Culture (routine x 2)     Status: None (Preliminary result)   Collection Time: 05/18/20  9:00 AM   Specimen: BLOOD  Result Value Ref Range Status   Specimen Description BLOOD LEFT ANTECUBITAL  Final   Special Requests   Final    BOTTLES DRAWN AEROBIC AND ANAEROBIC Blood Culture results may not be optimal due to an inadequate volume of blood received in culture bottles   Culture   Final    NO GROWTH 4 DAYS Performed at [facility], [address], [place], [place] [address]    Report Status PENDING  Incomplete  Blood Culture (routine x 2)     Status: None (Preliminary result)   Collection Time: 05/18/20  9:00 AM   Specimen: BLOOD  Result Value Ref Range Status   Specimen Description BLOOD BLOOD LEFT HAND  Final   Special Requests   Final    BOTTLES DRAWN AEROBIC AND ANAEROBIC Blood Culture results may not be optimal due to an inadequate volume of blood received in culture bottles   Culture   Final    NO GROWTH 4 DAYS Performed at [facility], [address] [place], [place] [address]    Report Status PENDING  Incomplete  Resp Panel by RT-PCR (Flu A&B, Covid) Nasopharyngeal Swab  Status: Abnormal   Collection Time: 05/18/20 10:04 AM   Specimen: Nasopharyngeal Swab; Nasopharyngeal(NP) swabs in vial transport medium  Result Value  Ref Range Status   SARS Coronavirus 2 by RT PCR POSITIVE (A) NEGATIVE Corrected    Comment: RESULT CALLED TO, READ BACK BY AND VERIFIED WITH:  Lovena Le LEWIS AT 1115 05/18/20 SDR (NOTE) SARS-CoV-2 target nucleic acids are DETECTED.  The SARS-CoV-2 RNA is generally detectable in upper respiratory specimens during the acute phase of infection. Positive results are indicative of the presence of the identified virus, but do not rule out bacterial infection or co-infection with other pathogens not detected by the test. Clinical correlation with patient history and other diagnostic information is necessary to determine patient infection status. The expected result is Negative.  Fact Sheet for Patients: EntrepreneurPulse.com.au  Fact Sheet for Healthcare Providers: IncredibleEmployment.be  This test is not yet approved or cleared by the [place] FDA and  has been authorized for detection and/or diagnosis of SARS-CoV-2 by FDA under an Emergency Use Authorization (EUA).  This EUA will remain in effect (meaning this test can be  used) for the duration of  the COVID-19 declaration under Section 564(b)(1) of the Act, 21 U.S.C. section 360bbb-3(b)(1), unless the authorization is terminated or revoked sooner.  CORRECTED ON 03/26 AT 0092: PREVIOUSLY REPORTED AS POSITIVE RESULT CALLED TO, READ BACK BY AND VERIFIED WITH:  Lovena Le LEWIS AT 3300 04/20/20 SDR    Influenza A by PCR NEGATIVE NEGATIVE Final   Influenza B by PCR NEGATIVE NEGATIVE Final    Comment: (NOTE) The Xpert Xpress SARS-CoV-2/FLU/RSV plus assay is intended as an aid in the diagnosis of influenza from Nasopharyngeal swab specimens and should not be used as a sole basis for treatment. Nasal washings and aspirates are unacceptable for Xpert Xpress SARS-CoV-2/FLU/RSV testing.  Fact Sheet for Patients: EntrepreneurPulse.com.au  Fact Sheet for Healthcare  Providers: IncredibleEmployment.be  This test is not yet approved or cleared by the [place] FDA and has been authorized for detection and/or diagnosis of SARS-CoV-2 by FDA under an Emergency Use Authorization (EUA). This EUA will remain in effect (meaning this test can be used) for the duration of the COVID-19 declaration under Section 564(b)(1) of the Act, 21 U.S.C. section 360bbb-3(b)(1), unless the authorization is terminated or revoked.  Performed at [facility], [address] [place], [place] [address]   Urine culture     Status: None   Collection Time: 05/18/20 11:45 AM   Specimen: Urine, Random  Result Value Ref Range Status   Specimen Description   Final    URINE, RANDOM Performed at [facility], [address], [place], [place] [address]    Special Requests   Final    NONE Performed at [facility], [address], [place], [place] [address]    Culture   Final    NO GROWTH Performed at [facility], [place] [address], [place], [place] [address]    Report Status 05/19/2020 FINAL  Final  MRSA PCR Screening     Status: None   Collection Time: 05/18/20  3:48 PM   Specimen: Nasal Mucosa; Nasopharyngeal  Result Value Ref Range Status   MRSA by PCR NEGATIVE NEGATIVE Final    Comment:        The GeneXpert MRSA Assay (FDA approved for NASAL specimens only), is one component of a comprehensive MRSA colonization surveillance program. It is not intended to diagnose MRSA infection nor to guide or monitor treatment for MRSA  infections. Performed at [facility], [address] [place], [place] [address]          Radiology Studies: DG Chest [place] 1 View  Result Date: 05/22/2020 CLINICAL DATA:  Hypoxia EXAM: PORTABLE CHEST 1 VIEW COMPARISON:  05/18/2020 FINDINGS: Large left pleural effusion with worsening left upper consolidation. Chronic right humeral head fracture. Mild right chest opacity.  IMPRESSION: Large left pleural effusion with worsening left upper consolidation. Electronically Signed   By: Ulyses Jarred M.D.   On: 05/22/2020 01:44   CT HEAD CODE STROKE WO CONTRAST`  Result Date: 05/22/2020 CLINICAL DATA:  Code stroke.  Acute neurologic deficit EXAM: CT HEAD WITHOUT CONTRAST TECHNIQUE: Contiguous axial images were obtained from the base of the skull through the vertex without intravenous contrast. COMPARISON:  None. FINDINGS: Brain: There is no mass, hemorrhage or extra-axial collection. The size and configuration of the ventricles and extra-axial CSF spaces are normal. There is hypoattenuation of the periventricular white matter, most commonly indicating chronic ischemic microangiopathy. Vascular: No abnormal hyperdensity of the major intracranial arteries or dural venous sinuses. No intracranial atherosclerosis. Skull: The visualized skull base, calvarium and extracranial soft tissues are normal. Sinuses/Orbits: No fluid levels or advanced mucosal thickening of the visualized paranasal sinuses. No mastoid or middle ear effusion. The orbits are normal. ASPECTS [facility] Stroke Program Early CT Score) - Ganglionic level infarction (caudate, lentiform nuclei, internal capsule, insula, M1-M3 cortex): 7 - Supraganglionic infarction (M4-M6 cortex): 3 Total score (0-10 with 10 being normal): 10 IMPRESSION: 1. Chronic ischemic microangiopathy without acute intracranial abnormality. 2. ASPECTS is 10. These results were called by telephone at the time of interpretation on 05/22/2020 at 12:35 am to provider [facility] , who verbally acknowledged these results. Electronically Signed   By: Ulyses Jarred M.D.   On: 05/22/2020 00:44        Scheduled Meds: . amLODipine  2.5 mg Oral Daily  . atorvastatin  20 mg Oral Daily  . Chlorhexidine Gluconate Cloth  6 each Topical Q0600  . clopidogrel  75 mg Oral Daily  . dextromethorphan-guaiFENesin  1 tablet Oral BID  . enoxaparin (LOVENOX)  injection  30 mg Subcutaneous Q24H  . ipratropium-albuterol  3 mL Nebulization BID  . iron polysaccharides  150 mg Oral Daily  . levETIRAcetam  250 mg Oral BID  . levothyroxine  100 mcg Intravenous Daily  . mometasone-formoterol  2 puff Inhalation BID  . montelukast  10 mg Oral QHS  . mupirocin ointment  1 application Nasal BID  . pantoprazole  20 mg Oral Daily  . pregabalin  100 mg Oral QHS  . pregabalin  50 mg Oral Daily  . sodium chloride flush  10-40 mL Intracatheter Q12H   Continuous Infusions: . ceFEPime (MAXIPIME) IV Stopped (05/21/20 1009)  . [START ON 05/24/2020] vancomycin       LOS: 4 days    Time spent: 34 mins     Wyvonnia Dusky, MD [facility] Pager [phone]  If 7PM-7AM, please contact night-coverage 05/22/2020, 8:13 AM

## 2020-05-22 NOTE — Progress Notes (Addendum)
Cross Cover RRT/Code stroke called for patient symptoms of decreased aousal, expressive aphasia and slurred speech preceded by bradycardic event Initial; CT negative for acute findings. Teleneuro, patient not candidate for tPA given likely multifactorial encephalopathy On exam after CT patient weak but speaking clearly and oriented.  She has no focal deficits. She is at baseline with severe generalized edema.  Patient admiits to her breathing being worse than prior and upon arrival back to room from CT her sats on 3LNC 80-82%, improved 90-100% with high flow nasal canula. vbg -7/26 -54 - 62 - 1.6 - 25.4  BNP 939.2 TROP 260 (prior 814) CXR IMPRESSION: Large left pleural effusion with worsening left upper consolidation  Acute respiratory failure with hypoxia secondary to pneumonia and congestive heart failure  Bipap Lasix strict I & O - daily weights Vanc added to cefepime given +MRSA screen Ddimer - elevated  low suspicion of PE  As patient on eliquis and IV heparin just stopped today  Metabolic encephalopathy - altered mental status Treat infection and increase thyroid hormone supplementation I  Hypothyroid TSH 12.221 - may be contributing factor to weakness/failure to thrive Levothyroxine increased from 88 to 100 mcg - repeat TSH 6 weeks  Worsening Anemia Iron profile in past - deficiency  + CKD - o iron supplementation HGB decrease to 7.3 from 8.3 No signs of overt bleed but given work of breathing and hypoxia will transfuse 1 unit PRBC

## 2020-05-22 NOTE — Progress Notes (Signed)
Primary RN received a call from telemetry at 0000, stated pt's leads needed to be checked. Upon entering pt's room, primary RN noted that pt was difficult to arouse, was very delayed when verbally responding to questions, and had slurred speech when she did try to speak. RR nurse called for evaluation, on call provider also came to bedside. Pt was taken to CT at approximately 0020, and code stroke was called. Pt's SpO2 on 3L [name] was noted to be 70-75%, blood gas, EKG, and labs obtained, cxr ordered. Pt placed on HFNC at 15L with improvement to 100%.  Pt not a candidate for tPA per tele neurologist, who was consulted and able to evaluate pt. Pt was able to articulate her responses somewhat more clearly, but was still noted to be slightly delayed with her responses and incorrect with some orientation quiestions. MD felt pt encelalopathic given her multiple comorbidities, recommends continuing to monitor and if mentation does not improve obtain MRI in AM. Pt also placed on bipap by RT per on call provider order.  CT, cxr, labs reviewed by on call provider, additional orders received at that time. 60mg  lasix administered, IV vancomycin initiated.   Pt now resting quietly with eyes closed on bipap, NAD. Will rouse with verbal stimulation.

## 2020-05-22 NOTE — Consult Note (Signed)
[facility] TeleNeurology Consult Services   Date of Service:   05/22/2020 00:53:57  Diagnosis:     .  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 87 y/o with PMH of CAD, HTN, dyslipidemia, CRI, DM, seizures (LEV 250mg  BID), afib on no anticoag, GIB, anemia, ovarian cancer, obesity, many medication intolerances. Admit 3/25 for PNA, NSTEMI. Was on heparin gtt, d/c'd 3/28. LKW 21:15 3/28. Later found encephalopathic without focality on exam, NIHSS 17. NCCT head without acuity. I do not recommend thrombolysis given lack of focality and many reasons in this 87 year-old woman for tox/metabolic encephalopathy. No seizures witnessed by Nursing.              Recommendations:       1) Evaluation for general medical causes of tox/metabolic encephalopathy       2) Avoid sedating medications       3) Unenhanced brain MRI 3/29 AM if MS has not improved       4) Further recommendations per Neurology pending the above results  Metrics: Last Known Well: 05/21/2020 21:15:17 [facility] Notification Time: 05/22/2020 00:53:57 Stamp Time: 05/22/2020 00:53:57 Initial Response Time: 05/22/2020 01:20:04 Symptoms: ms change. NIHSS Start Assessment Time: 05/22/2020 01:25:27 Patient is not a candidate for Thrombolytic. Thrombolytic Medical Decision: 05/22/2020 01:37:36 Patient was not deemed candidate for Thrombolytic because of following reasons: Other Diagnosis suspected. she is encephalopathic without focality.  CT head showed no acute hemorrhage or acute core infarct.  Advanced Imaging: Advanced Imaging Not Recommended because:  Clinical presentation is not suggestion of LVO or Low clinical suspicion of LVO based on presentation   ------------------------------------------------------------------------------  History of Present Illness: Patient is a 87 year old Female.  Inpatient stroke alert was called for symptoms of ms change.  c No sedating meds given per  Nursing. Tramadol 7:00AM 3/28.She is generally weak, deconditioned, and fell and fractured the right arm. PMH of CAD, HTN, dyslipidemia, CRI, DM, seizures (LEV 250mg  BID), afib on no anticoag, GIB, anemia, ovarian cancer, obesity, many medication intolerances -Labs today: Hct 27.2, creatinine 1.54   Anticoagulant use:  Yes heparin sq  Antiplatelet use: Yes plavix  Allergies:  Reviewed  NIHSS may not be reliable due to: cannot activate right arm (fracture). minimal activation of legs (chronic, deconditioning).  Examination: BP(107/57), Pulse(69), Blood Glucose(115) 1A: Level of Consciousness - Arouses to minor stimulation + 1 1B: Ask Month and Age - 1 Question Right + 1 1C: Blink Eyes & Squeeze Hands - Performs Both Tasks + 0 2: Test Horizontal Extraocular Movements - Normal + 0 3: Test Visual Fields - No Visual Loss + 0 4: Test Facial Palsy (Use Grimace if Obtunded) - Normal symmetry + 0 5A: Test Left Arm Motor Drift - No Drift for 10 Seconds + 0 5B: Test Right Arm Motor Drift - No Movement + 4 6A: Test Left Leg Motor Drift - Some Effort Against Gravity + 2 6B: Test Right Leg Motor Drift - Some Effort Against Gravity + 2 7: Test Limb Ataxia (FNF/Heel-Shin) - No Ataxia + 0 8: Test Sensation - Coma/Unresponsive + 2 9: Test Language/Aphasia - Mute/Global Aphasia: No Usable Speech/Auditory Comprehension + 3 10: Test Dysarthria - Mute/Anarthric + 2 11: Test Extinction/Inattention - No abnormality + 0  NIHSS Score: 17  Pre-Morbid Modified Rankin Scale: Unable to assess   Patient/Family was informed the Neurology Consult would occur via TeleHealth consult by way of interactive audio and video telecommunications and consented to receiving care in  this manner.   Patient is being evaluated for possible acute neurologic impairment and high probability of imminent or life-threatening deterioration. I spent total of 30 minutes providing care to this patient, including time for face to face  visit via telemedicine, review of medical records, imaging studies and discussion of findings with providers, the patient and/or family.   Dr Jonelle Sidle   [facility] [phone]  Case [id number]

## 2020-05-22 NOTE — Significant Event (Signed)
Rapid Response Event Note   Reason for Call :  Possible Code Stroke  Initial Focused Assessment:  Patient with increase work of breathing Delayed responses/ slurred speech      Interventions:  Patient Placed on HFNC 15 L Provider notified  CBG:115 Patient to CT EKG gathered with Provider at bedside awaiting doctor on Code stroke machine CXR and Labs ordered Patient placed on bipap. Plan of Care:  CT- Negative  Not a Candidate for TPA Bipap and Lasixs due to CXR findings   Event Summary:   MD Notified: [id number] Call Time: Actual call time 0000 Arrival Time: Rapid response nurse already at bed side at [place], RN

## 2020-05-22 NOTE — Progress Notes (Signed)
Rapid follow up 0300 0430 Verbal Conversations with Primary nurse Barbera Setters) Patients blood pressure has dropped since being placed on BiPAP and given lasixs.   Patient is awaiting blood transfusion and provider is aware of blood pressure  Patient resting comfortably on bipap.

## 2020-05-22 NOTE — Progress Notes (Signed)
Pharmacy Antibiotic Note  Amy [name] is a 87 y.o. female admitted on 05/18/2020 with sepsis.  Pharmacy has been consulted for Vancomycin dosing.  Plan: Vancomycin 2 gm IV X 1 ordered for 3/29 @ 0300. Vancomycin 750 mg IV Q48H ordered for 3/31 @ 0300.  AUC = 446.4 Vanc trough = 11.3   Height: 5\' 2"  (157.5 cm) Weight: 83.5 kg (184 lb) IBW/kg (Calculated) : 50.1  Temp (24hrs), Avg:98.1 F (36.7 C), Min:97.7 F (36.5 C), Max:98.5 F (36.9 C)  Recent Labs  Lab 05/18/20 0900 05/18/20 1110 05/19/20 0731 05/19/20 2136 05/20/20 0021 05/20/20 0025 05/21/20 0318 05/22/20 0052  WBC 4.7  --  12.6*  --   --  14.2* 12.3* 10.0  CREATININE 1.66*  --  1.71*  --   --  1.45* 1.53*  --   LATICACIDVEN 2.9* 1.5  --  0.9 0.8  --   --   --     Estimated Creatinine Clearance: 24 mL/min (A) (by C-G formula based on SCr of 1.53 mg/dL (H)).    Allergies  Allergen Reactions  . Atenolol Other (See Comments)    Other reaction(s): Other (See Comments) Decreased heart rate Decreased heart rate  . Codeine Shortness Of Breath    Rash, difficulty breathing, nausea.  . Losartan Other (See Comments)    Hyperkalemia  . Morphine And Related Shortness Of Breath    Rash, difficulty breathing, nausea.   . Fentanyl Other (See Comments)    hallucinations  . Nsaids Other (See Comments)    Other reaction(s): Unknown  . Rofecoxib Other (See Comments)    Other reaction(s): Unknown  . Sucralfate Other (See Comments)    Throat tightness  . Sulfa Antibiotics Other (See Comments)    Other reaction(s): Unknown  . Tramadol Other (See Comments)    lethargy    Antimicrobials this admission:   >>    >>   Dose adjustments this admission:   Microbiology results:  BCx:   UCx:    Sputum:    MRSA PCR:   Thank you for allowing pharmacy to be a part of this patient's care.  Roxie Gueye D 05/22/2020 2:25 AM

## 2020-05-22 NOTE — Progress Notes (Signed)
Progress Note  Patient Name: Amy [name] Date of Encounter: 05/22/2020  Primary Cardiologist: Ida Rogue, MD   Subjective   She had mental status change overnight and was seen by neurology due to concerns about stroke.  It was felt that the patient was encephalopathic.  She is currently asleep.   Inpatient Medications    Scheduled Meds: . amLODipine  2.5 mg Oral Daily  . atorvastatin  20 mg Oral Daily  . Chlorhexidine Gluconate Cloth  6 each Topical Q0600  . clopidogrel  75 mg Oral Daily  . dextromethorphan-guaiFENesin  1 tablet Oral BID  . enoxaparin (LOVENOX) injection  30 mg Subcutaneous Q24H  . ipratropium-albuterol  3 mL Nebulization BID  . iron polysaccharides  150 mg Oral Daily  . levothyroxine  100 mcg Oral Q0600  . mometasone-formoterol  2 puff Inhalation BID  . montelukast  10 mg Oral QHS  . mupirocin ointment  1 application Nasal BID  . pantoprazole (PROTONIX) IV  40 mg Intravenous Daily  . pregabalin  100 mg Oral QHS  . pregabalin  50 mg Oral Daily  . sodium chloride flush  10-40 mL Intracatheter Q12H   Continuous Infusions: . levETIRAcetam    . piperacillin-tazobactam (ZOSYN)  IV    . [START ON 05/24/2020] vancomycin     PRN Meds: acetaminophen, albuterol, docusate sodium, melatonin, nitroGLYCERIN, ondansetron (ZOFRAN) IV, polyethylene glycol, sodium chloride flush, traMADol   Vital Signs    Vitals:   05/22/20 1005 05/22/20 1110 05/22/20 1235 05/22/20 1302  BP: (!) 136/50  (!) 123/45 (!) 117/47  Pulse: (!) 58  (!) 58 60  Resp:   19   Temp: 98.1 F (36.7 C)  97.9 F (36.6 C) 97.8 F (36.6 C)  TempSrc: Axillary  Oral Oral  SpO2: 100% 100% 100% 100%  Weight:      Height:        Intake/Output Summary (Last 24 hours) at 05/22/2020 1313 Last data filed at 05/22/2020 0951 Gross per 24 hour  Intake 932.45 ml  Output 1225 ml  Net -292.55 ml   Last 3 Weights 05/22/2020 05/20/2020 05/18/2020  Weight (lbs) 181 lb 12.8 oz 184 lb 190 lb  Weight  (kg) 82.464 kg 83.462 kg 86.183 kg  Some encounter information is confidential and restricted. Go to Review Flowsheets activity to see all data.      Telemetry    NSR with first-degree AV block.- Personally Reviewed  ECG    No new tracings- Personally Reviewed  Physical Exam   GEN: No acute distress.   Neck: No JVD Cardiac: RRR, no rubs, or gallops.  1/ 6 systolic murmur at the base. Respiratory: Diminished breath sound at the base especially the left GI: Soft, nontender, non-distended  MS: No edema; No deformity.  Right arm in cast. Neuro:  Nonfocal  Psych: Normal affect   Labs    High Sensitivity Troponin:   Recent Labs  Lab 05/18/20 1548 05/18/20 1606 05/18/20 1937 05/22/20 0052 05/22/20 0244  TROPONINIHS 1,163* 1,196* 814* 260* 200*      Chemistry Recent Labs  Lab 05/18/20 0900 05/19/20 0731 05/20/20 0025 05/21/20 0318 05/22/20 0244  NA 142   < > 137 141 139  K 4.6   < > 3.4* 4.1 4.7  CL 108   < > 107 110 110  CO2 25   < > 23 24 23   GLUCOSE 119*   < > 99 101* 109*  BUN 34*   < > 42* 43* 41*  CREATININE 1.66*   < > 1.45* 1.53* 1.49*  CALCIUM 7.7*   < > 7.6* 7.8* 7.9*  PROT 5.6*  --   --   --  4.7*  ALBUMIN 3.0*  --   --   --  2.2*  AST 19  --   --   --  12*  ALT 8  --   --   --  8  ALKPHOS 80  --   --   --  64  BILITOT 0.9  --   --   --  0.6  GFRNONAA 29*   < > 34* 32* 33*  ANIONGAP 9   < > 7 7 6    < > = values in this interval not displayed.     Hematology Recent Labs  Lab 05/21/20 0318 05/21/20 1940 05/22/20 0052 05/22/20 0244  WBC 12.3*  --  10.0 10.0  RBC 2.39*  --  2.76* 2.42*  HGB 7.2* 8.3* 8.3* 7.3*  HCT 23.2* 25.5* 27.2* 24.3*  MCV 97.1  --  98.6 100.4*  MCH 30.1  --  30.1 30.2  MCHC 31.0  --  30.5 30.0  RDW 15.3  --  15.2 15.2  PLT 219  --  221 200    BNP Recent Labs  Lab 05/18/20 1110 05/22/20 0052  BNP 748.3* 939.2*     DDimer  Recent Labs  Lab 05/22/20 0052  DDIMER 1.99*     Radiology    DG Chest Port 1  View  Result Date: 05/22/2020 CLINICAL DATA:  Hypoxia EXAM: PORTABLE CHEST 1 VIEW COMPARISON:  05/18/2020 FINDINGS: Large left pleural effusion with worsening left upper consolidation. Chronic right humeral head fracture. Mild right chest opacity. IMPRESSION: Large left pleural effusion with worsening left upper consolidation. Electronically Signed   By: Ulyses Jarred M.D.   On: 05/22/2020 01:44   CT HEAD CODE STROKE WO CONTRAST`  Result Date: 05/22/2020 CLINICAL DATA:  Code stroke.  Acute neurologic deficit EXAM: CT HEAD WITHOUT CONTRAST TECHNIQUE: Contiguous axial images were obtained from the base of the skull through the vertex without intravenous contrast. COMPARISON:  None. FINDINGS: Brain: There is no mass, hemorrhage or extra-axial collection. The size and configuration of the ventricles and extra-axial CSF spaces are normal. There is hypoattenuation of the periventricular white matter, most commonly indicating chronic ischemic microangiopathy. Vascular: No abnormal hyperdensity of the major intracranial arteries or dural venous sinuses. No intracranial atherosclerosis. Skull: The visualized skull base, calvarium and extracranial soft tissues are normal. Sinuses/Orbits: No fluid levels or advanced mucosal thickening of the visualized paranasal sinuses. No mastoid or middle ear effusion. The orbits are normal. ASPECTS [facility] Stroke Program Early CT Score) - Ganglionic level infarction (caudate, lentiform nuclei, internal capsule, insula, M1-M3 cortex): 7 - Supraganglionic infarction (M4-M6 cortex): 3 Total score (0-10 with 10 being normal): 10 IMPRESSION: 1. Chronic ischemic microangiopathy without acute intracranial abnormality. 2. ASPECTS is 10. These results were called by telephone at the time of interpretation on 05/22/2020 at 12:35 am to provider [name] , who verbally acknowledged these results. Electronically Signed   By: Ulyses Jarred M.D.   On: 05/22/2020 00:44    Cardiac Studies    Echo 02/07/20 1. Left ventricular ejection fraction, by estimation, is 55 to 60%. The  left ventricle has normal function. The left ventricle has no regional  wall motion abnormalities. There is mild left ventricular hypertrophy.  Left ventricular diastolic parameters  are consistent with Grade II diastolic dysfunction (pseudonormalization).  Elevated left  atrial pressure.  2. Right ventricular systolic function is normal. The right ventricular  size is mildly enlarged. There is mildly elevated pulmonary artery  systolic pressure.  3. Left atrial size was mildly dilated.  4. Right atrial size was mildly dilated.  5. The mitral valve is abnormal. Trivial mitral valve regurgitation. Mild  to moderate mitral stenosis. Severe mitral annular calcification.  6. The aortic valve is tricuspid. There is mild calcification of the  aortic valve. There is mild thickening of the aortic valve. Aortic valve  regurgitation is trivial. Mild aortic valve stenosis. Aortic valve mean  gradient measures 12.0 mmHg.  7. The inferior vena cava is normal in size with <50% respiratory  variability, suggesting right atrial pressure of 8 mmHg.    Lexiscan MPI 04/23/2017: Pharmacological myocardial perfusion imaging study with no significant Ischemia Small region of fixed apical thinning, consistent with attenuation artifact GI uptake artifact noted Normal wall motion, EF estimated at 56% No EKG changes concerning for ischemia at peak stress or in recovery. Low risk scan   Patient Profile     87 y.o. female with history of CAD s/p prior LAD stenting in 2011, HFpEF, carotid artery disease, GI bleed, frequent falls, DM, CKD stage IV, HTN with RAS s/tp renal artery stenting in 01/2019, HLD, obesity, hypothyroidism, anemia of chronic disease/iron deficiency anemia, shingles,  COPD, and admitted with pna and elevated HS Tn.  Assessment & Plan    NSTEMI Known CAD s/p LAD stenting 2011 Suspect  supply demand ischemia in the setting of pneumonia and anemia.  Heparin was discontinued yesterday.  The patient is on clopidogrel without aspirin. No beta-blockers due to previous history of bradycardia and dizziness. Continue medical therapy. Given age and comorbidities, no plans for further ischemic cardiac evaluation.  HTN Blood pressure is controlled on amlodipine.  PAF not on [place] --Currently NSR and rate controlled in the 60s.  Not on beta-blocker due to history of bradycardia/dizziness/syncope.  Not on home anticoagulation due to risk of bleeding due to frequent falls.    Pna Sepsis --Presented with pna and sepsis. Continue tx / abx per IM.   AOC anemia of chronic dz with history of GIB  --Hemoglobin today 7.3.  HLD --LDL 36.  Continue treatment with atorvastatin.   For questions or updates, please contact [place] Please consult [email or web address] for contact info under        Signed, Kathlyn [place], MD  05/22/2020, 1:13 PM

## 2020-05-22 NOTE — Care Management Important Message (Signed)
Important Message  Patient Details  Name: Amy [name] MRN: [id number] Date of Birth: Aug 27, 1927   Medicare Important Message Given:  Yes     Dannette Barbara 05/22/2020, 11:24 AM

## 2020-05-22 NOTE — TOC Progression Note (Signed)
Transition of Care [facility]) - Progression Note    Patient Details  Name: Amy [name] MRN: [id number] Date of Birth: 19-Apr-1927  Transition of Care [facility]) CM/SW [place], RN Phone Number: 05/22/2020, 1:57 PM  Clinical Narrative: Patient is resident at [place] at [facility] room  E-14. Can return when medically stable.           Expected Discharge Plan and Services                                                 Social Determinants of Health (SDOH) Interventions    Readmission Risk Interventions Readmission Risk Prevention Plan 08/23/2019 08/15/2019  Transportation Screening Complete Complete  Medication Review [employer] photographer) Complete Complete  PCP or Specialist appointment within 3-5 days of discharge Complete Complete  HRI or Home Care Consult Complete Complete  SW Recovery Care/Counseling Consult Complete Complete  Palliative Care Screening Not Applicable Not [place] Complete Not Applicable  Some recent data might be hidden

## 2020-05-23 ENCOUNTER — Inpatient Hospital Stay (HOSPITAL_COMMUNITY)
Admit: 2020-05-23 | Discharge: 2020-05-23 | Disposition: A | Discharge status: Home / Self Care | Payer: Medicare Other | Attending: Physician Assistant | Admitting: Physician Assistant

## 2020-05-23 DIAGNOSIS — J189 Pneumonia, unspecified organism: Secondary | ICD-10-CM | POA: Diagnosis not present

## 2020-05-23 DIAGNOSIS — I214 Non-ST elevation (NSTEMI) myocardial infarction: Secondary | ICD-10-CM | POA: Diagnosis not present

## 2020-05-23 DIAGNOSIS — R079 Chest pain, unspecified: Secondary | ICD-10-CM

## 2020-05-23 DIAGNOSIS — I480 Paroxysmal atrial fibrillation: Secondary | ICD-10-CM | POA: Diagnosis not present

## 2020-05-23 LAB — TYPE AND SCREEN
ABO/RH(D): O POS
Antibody Screen: NEGATIVE
Unit division: 0

## 2020-05-23 LAB — CULTURE, BLOOD (ROUTINE X 2)
Culture: NO GROWTH
Culture: NO GROWTH

## 2020-05-23 LAB — BASIC METABOLIC PANEL
Anion gap: 6 (ref 5–15)
BUN: 42 mg/dL — ABNORMAL HIGH (ref 8–23)
CO2: 24 mmol/L (ref 22–32)
Calcium: 8.3 mg/dL — ABNORMAL LOW (ref 8.9–10.3)
Chloride: 111 mmol/L (ref 98–111)
Creatinine, Ser: 1.55 mg/dL — ABNORMAL HIGH (ref 0.44–1.00)
GFR, Estimated: 31 mL/min — ABNORMAL LOW (ref >60)
Glucose, Bld: 77 mg/dL (ref 70–99)
Potassium: 4.6 mmol/L (ref 3.5–5.1)
Sodium: 141 mmol/L (ref 135–145)

## 2020-05-23 LAB — CBC
HCT: 28.3 % — ABNORMAL LOW (ref 36.0–46.0)
Hemoglobin: 8.7 g/dL — ABNORMAL LOW (ref 12.0–15.0)
MCH: 29.4 pg (ref 26.0–34.0)
MCHC: 30.7 g/dL (ref 30.0–36.0)
MCV: 95.6 fL (ref 80.0–100.0)
Platelets: 191 10*3/uL (ref 150–400)
RBC: 2.96 MIL/uL — ABNORMAL LOW (ref 3.87–5.11)
RDW: 15.9 % — ABNORMAL HIGH (ref 11.5–15.5)
WBC: 6.9 10*3/uL (ref 4.0–10.5)
nRBC: 0 % (ref 0.0–0.2)

## 2020-05-23 LAB — GLUCOSE, CAPILLARY: Glucose-Capillary: 78 mg/dL (ref 70–99)

## 2020-05-23 LAB — ECHOCARDIOGRAM LIMITED
Height: 62 in
S' Lateral: 2.74 cm
Weight: 2908.8 oz

## 2020-05-23 LAB — MAGNESIUM: Magnesium: 1.9 mg/dL (ref 1.7–2.4)

## 2020-05-23 LAB — BPAM RBC
Blood Product Expiration Date: 202204272359
ISSUE DATE / TIME: 202203290947
Unit Type and Rh: 5100

## 2020-05-23 LAB — PROCALCITONIN: Procalcitonin: 2.87 ng/mL

## 2020-05-23 LAB — MRSA PCR SCREENING: MRSA by PCR: NEGATIVE

## 2020-05-23 MED ORDER — PANTOPRAZOLE SODIUM 20 MG PO TBEC
20.0000 mg | DELAYED_RELEASE_TABLET | Freq: Every day | ORAL | Status: DC
  Administered 2020-05-23 – 2020-06-02 (×10): 20 mg via ORAL
  Filled 2020-05-23 (×12): qty 1

## 2020-05-23 MED ORDER — LEVETIRACETAM 250 MG PO TABS
250.0000 mg | ORAL_TABLET | Freq: Two times a day (BID) | ORAL | Status: DC
  Administered 2020-05-23 – 2020-06-02 (×20): 250 mg via ORAL
  Filled 2020-05-23 (×21): qty 1

## 2020-05-23 NOTE — Progress Notes (Signed)
*  PRELIMINARY RESULTS* Echocardiogram 2D Echocardiogram has been performed.  Amy [name] 05/23/2020, 9:54 AM

## 2020-05-23 NOTE — Progress Notes (Signed)
PT Cancellation Note  Patient Details Name: Amy [name] MRN: [id number] DOB: 06-18-1927   Cancelled Treatment:    Reason Eval/Treat Not Completed: Other (comment).  Chart reviewed.  Nurse reports pt is supposed to go for a thoracentesis soon (already gave report).  Per discussion with MD Billie Ruddy, will hold PT at this time and re-attempt PT session at a later date/time as able.  Leitha Bleak, PT 05/23/20, 3:25 PM

## 2020-05-23 NOTE — Progress Notes (Signed)
OT Cancellation Note  Patient Details Name: Amy [name] MRN: [id number] DOB: [birth year]   Cancelled Treatment:    Reason Eval/Treat Not Completed: Patient at procedure or test/ unavailable. Per RN, pt about to leave for thoracentesis procedure. OT will re-attempt at later date/time as pt is available and medically appropriate.   Fredirick Maudlin, OTR/L [place]

## 2020-05-23 NOTE — Progress Notes (Signed)
Progress Note  Patient Name: Amy [name] Date of Encounter: 05/23/2020  Primary Cardiologist: Ida Rogue, MD   Subjective   The patient is somnolent but does open her eyes in response.  Daughter is at the bedside.  She reports that the patient has very poor appetite. Chest x-ray yesterday showed significant enlargement of left pleural effusion.   Inpatient Medications    Scheduled Meds: . amLODipine  2.5 mg Oral Daily  . atorvastatin  20 mg Oral Daily  . Chlorhexidine Gluconate Cloth  6 each Topical Q0600  . clopidogrel  75 mg Oral Daily  . dextromethorphan-guaiFENesin  1 tablet Oral BID  . enoxaparin (LOVENOX) injection  30 mg Subcutaneous Q24H  . ipratropium-albuterol  3 mL Nebulization BID  . iron polysaccharides  150 mg Oral Daily  . levETIRAcetam  250 mg Oral BID  . levothyroxine  100 mcg Oral Q0600  . mometasone-formoterol  2 puff Inhalation BID  . montelukast  10 mg Oral QHS  . mupirocin ointment  1 application Nasal BID  . pantoprazole  20 mg Oral Daily  . pregabalin  100 mg Oral QHS  . pregabalin  50 mg Oral Daily  . sodium chloride flush  10-40 mL Intracatheter Q12H   Continuous Infusions: . piperacillin-tazobactam (ZOSYN)  IV 3.375 g (05/23/20 0500)   PRN Meds: acetaminophen, albuterol, docusate sodium, melatonin, nitroGLYCERIN, ondansetron (ZOFRAN) IV, polyethylene glycol, sodium chloride flush, traMADol   Vital Signs    Vitals:   05/22/20 2337 05/23/20 0457 05/23/20 0724 05/23/20 0749  BP: (!) 117/42 (!) 138/45  (!) 133/36  Pulse: 60 60 64 (!) 56  Resp: 20   16  Temp: 97.7 F (36.5 C) 98.1 F (36.7 C)  (!) 97.5 F (36.4 C)  TempSrc: Axillary Axillary  Oral  SpO2: 97% 100% 99% 100%  Weight:      Height:        Intake/Output Summary (Last 24 hours) at 05/23/2020 1128 Last data filed at 05/22/2020 1422 Gross per 24 hour  Intake 340 ml  Output 1000 ml  Net -660 ml   Last 3 Weights 05/22/2020 05/20/2020 05/18/2020  Weight (lbs) 181 lb  12.8 oz 184 lb 190 lb  Weight (kg) 82.464 kg 83.462 kg 86.183 kg  Some encounter information is confidential and restricted. Go to Review Flowsheets activity to see all data.      Telemetry    NSR with first-degree AV block.- Personally Reviewed  ECG    No new tracings- Personally Reviewed  Physical Exam   GEN: No acute distress.   Neck: No JVD Cardiac: RRR, no rubs, or gallops.  1/ 6 systolic murmur at the base. Respiratory: Diminished breath sound at the base especially the left GI: Soft, nontender, non-distended  MS: No edema; No deformity.  Right arm in cast. Neuro:  Nonfocal  Psych: Normal affect   Labs    High Sensitivity Troponin:   Recent Labs  Lab 05/18/20 1548 05/18/20 1606 05/18/20 1937 05/22/20 0052 05/22/20 0244  TROPONINIHS 1,163* 1,196* 814* 260* 200*      Chemistry Recent Labs  Lab 05/18/20 0900 05/19/20 0731 05/21/20 0318 05/22/20 0244 05/23/20 0452  NA 142   < > 141 139 141  K 4.6   < > 4.1 4.7 4.6  CL 108   < > 110 110 111  CO2 25   < > 24 23 24   GLUCOSE 119*   < > 101* 109* 77  BUN 34*   < > 43*  41* 42*  CREATININE 1.66*   < > 1.53* 1.49* 1.55*  CALCIUM 7.7*   < > 7.8* 7.9* 8.3*  PROT 5.6*  --   --  4.7*  --   ALBUMIN 3.0*  --   --  2.2*  --   AST 19  --   --  12*  --   ALT 8  --   --  8  --   ALKPHOS 80  --   --  64  --   BILITOT 0.9  --   --  0.6  --   GFRNONAA 29*   < > 32* 33* 31*  ANIONGAP 9   < > 7 6 6    < > = values in this interval not displayed.     Hematology Recent Labs  Lab 05/22/20 0052 05/22/20 0244 05/22/20 1552 05/23/20 0452  WBC 10.0 10.0  --  6.9  RBC 2.76* 2.42*  --  2.96*  HGB 8.3* 7.3* 8.8* 8.7*  HCT 27.2* 24.3* 28.4* 28.3*  MCV 98.6 100.4*  --  95.6  MCH 30.1 30.2  --  29.4  MCHC 30.5 30.0  --  30.7  RDW 15.2 15.2  --  15.9*  PLT 221 200  --  191    BNP Recent Labs  Lab 05/18/20 1110 05/22/20 0052  BNP 748.3* 939.2*     DDimer  Recent Labs  Lab 05/22/20 0052  DDIMER 1.99*      Radiology    DG Chest Port 1 View  Result Date: 05/22/2020 CLINICAL DATA:  Hypoxia EXAM: PORTABLE CHEST 1 VIEW COMPARISON:  05/18/2020 FINDINGS: Large left pleural effusion with worsening left upper consolidation. Chronic right humeral head fracture. Mild right chest opacity. IMPRESSION: Large left pleural effusion with worsening left upper consolidation. Electronically Signed   By: Ulyses Jarred M.D.   On: 05/22/2020 01:44   CT HEAD CODE STROKE WO CONTRAST`  Result Date: 05/22/2020 CLINICAL DATA:  Code stroke.  Acute neurologic deficit EXAM: CT HEAD WITHOUT CONTRAST TECHNIQUE: Contiguous axial images were obtained from the base of the skull through the vertex without intravenous contrast. COMPARISON:  None. FINDINGS: Brain: There is no mass, hemorrhage or extra-axial collection. The size and configuration of the ventricles and extra-axial CSF spaces are normal. There is hypoattenuation of the periventricular white matter, most commonly indicating chronic ischemic microangiopathy. Vascular: No abnormal hyperdensity of the major intracranial arteries or dural venous sinuses. No intracranial atherosclerosis. Skull: The visualized skull base, calvarium and extracranial soft tissues are normal. Sinuses/Orbits: No fluid levels or advanced mucosal thickening of the visualized paranasal sinuses. No mastoid or middle ear effusion. The orbits are normal. ASPECTS [facility] Stroke Program Early CT Score) - Ganglionic level infarction (caudate, lentiform nuclei, internal capsule, insula, M1-M3 cortex): 7 - Supraganglionic infarction (M4-M6 cortex): 3 Total score (0-10 with 10 being normal): 10 IMPRESSION: 1. Chronic ischemic microangiopathy without acute intracranial abnormality. 2. ASPECTS is 10. These results were called by telephone at the time of interpretation on 05/22/2020 at 12:35 am to provider [facility] , who verbally acknowledged these results. Electronically Signed   By: Ulyses Jarred M.D.   On:  05/22/2020 00:44   ECHOCARDIOGRAM LIMITED  Result Date: 05/23/2020    ECHOCARDIOGRAM LIMITED REPORT   Patient Name:   Amy [name] Date of Exam: 05/23/2020 Medical Rec #:  [id number]          Height:       62.0 in Accession #:    [id number]  Weight:       181.8 lb Date of Birth:  [birth year]          BSA:          1.836 m Patient Age:    87 years           BP:           133/36 mmHg Patient Gender: F                  HR:           56 bpm. Exam Location:  [facility] Procedure: Limited Echo, Color Doppler and Cardiac Doppler Indications:     Chest pain R07.9                  NSTEMI I21.4  History:         Patient has prior history of Echocardiogram examinations, most                  recent 02/07/2020. Risk Factors:Hypertension and Diabetes.  Sonographer:     Sherrie Sport RDCS (AE) Referring Phys:  [id number] Arvil Chaco Diagnosing Phys: Kathlyn Sacramento MD IMPRESSIONS  1. Left ventricular ejection fraction, by estimation, is 55 to 60%. The left ventricle has normal function. Left ventricular endocardial border not optimally defined to evaluate regional wall motion. Left ventricular diastolic function could not be evaluated.  2. Right ventricular systolic function is normal. The right ventricular size is normal. Tricuspid regurgitation signal is inadequate for assessing PA pressure.  3. Left atrial size was mildly dilated.  4. Moderate pleural effusion.  5. The mitral valve is abnormal. No evidence of mitral valve regurgitation. No evidence of mitral stenosis. Moderate mitral annular calcification.  6. The aortic valve is normal in structure. Aortic valve regurgitation is not visualized. Mild aortic valve stenosis. No gradients were measured.  7. limited and challenging study. FINDINGS  Left Ventricle: Left ventricular ejection fraction, by estimation, is 55 to 60%. The left ventricle has normal function. Left ventricular endocardial border not optimally defined to evaluate regional wall motion. The left  ventricular internal cavity size was normal in size. There is no left ventricular hypertrophy. Left ventricular diastolic function could not be evaluated. Right Ventricle: The right ventricular size is normal. No increase in right ventricular wall thickness. Right ventricular systolic function is normal. Tricuspid regurgitation signal is inadequate for assessing PA pressure. Left Atrium: Left atrial size was mildly dilated. Right Atrium: Right atrial size was normal in size. Pericardium: There is no evidence of pericardial effusion. Mitral Valve: The mitral valve is abnormal. Moderate mitral annular calcification. No evidence of mitral valve stenosis. Tricuspid Valve: The tricuspid valve is normal in structure. Tricuspid valve regurgitation is not demonstrated. No evidence of tricuspid stenosis. Aortic Valve: The aortic valve is normal in structure. Aortic valve regurgitation is not visualized. Mild aortic stenosis is present. Pulmonic Valve: The pulmonic valve was normal in structure. Pulmonic valve regurgitation is not visualized. No evidence of pulmonic stenosis. Aorta: The aortic root is normal in size and structure. Venous: The inferior vena cava was not well visualized. IAS/Shunts: No atrial level shunt detected by color flow Doppler. Additional Comments: There is a moderate pleural effusion. LEFT VENTRICLE PLAX 2D LVIDd:         3.92 cm LVIDs:         2.74 cm LV PW:         1.54 cm LV IVS:        0.97  cm LVOT diam:     2.00 cm LVOT Area:     3.14 cm  LEFT ATRIUM         Index LA diam:    5.10 cm 2.78 cm/m                        PULMONIC VALVE AORTA                 PV Vmax:        1.12 m/s Ao Root diam: 2.40 cm PV Peak grad:   5.0 mmHg                       RVOT Peak grad: 12 mmHg   SHUNTS Systemic Diam: 2.00 cm Kathlyn Sacramento MD Electronically signed by Kathlyn Sacramento MD Signature Date/Time: 05/23/2020/10:19:48 AM    Final     Cardiac Studies   Echo 02/07/20 1. Left ventricular ejection fraction, by  estimation, is 55 to 60%. The  left ventricle has normal function. The left ventricle has no regional  wall motion abnormalities. There is mild left ventricular hypertrophy.  Left ventricular diastolic parameters  are consistent with Grade II diastolic dysfunction (pseudonormalization).  Elevated left atrial pressure.  2. Right ventricular systolic function is normal. The right ventricular  size is mildly enlarged. There is mildly elevated pulmonary artery  systolic pressure.  3. Left atrial size was mildly dilated.  4. Right atrial size was mildly dilated.  5. The mitral valve is abnormal. Trivial mitral valve regurgitation. Mild  to moderate mitral stenosis. Severe mitral annular calcification.  6. The aortic valve is tricuspid. There is mild calcification of the  aortic valve. There is mild thickening of the aortic valve. Aortic valve  regurgitation is trivial. Mild aortic valve stenosis. Aortic valve mean  gradient measures 12.0 mmHg.  7. The inferior vena cava is normal in size with <50% respiratory  variability, suggesting right atrial pressure of 8 mmHg.    Lexiscan MPI 04/23/2017: Pharmacological myocardial perfusion imaging study with no significant Ischemia Small region of fixed apical thinning, consistent with attenuation artifact GI uptake artifact noted Normal wall motion, EF estimated at 56% No EKG changes concerning for ischemia at peak stress or in recovery. Low risk scan   Patient Profile     87 y.o. female with history of CAD s/p prior LAD stenting in 2011, HFpEF, carotid artery disease, GI bleed, frequent falls, DM, CKD stage IV, HTN with RAS s/tp renal artery stenting in 01/2019, HLD, obesity, hypothyroidism, anemia of chronic disease/iron deficiency anemia, shingles,  COPD, and admitted with pna and elevated HS Tn.  Assessment & Plan    NSTEMI Known CAD s/p LAD stenting 2011 Suspect supply demand ischemia in the setting of pneumonia and anemia.   Heparin was discontinued after 48 hours.  The patient is on clopidogrel without aspirin. No beta-blockers due to previous history of bradycardia and dizziness. Continue medical therapy. Given age and comorbidities, no plans for further ischemic cardiac evaluation.  HTN Blood pressure is controlled on amlodipine.  PAF not on Edie --Currently NSR and rate controlled in the 60s.  Not on beta-blocker due to history of bradycardia/dizziness/syncope.  Not on home anticoagulation due to risk of bleeding due to frequent falls.    Pna Sepsis --Presented with pna and sepsis. Continue tx / abx per IM.   Enlarging left pleural effusion: This could be due to underlying pneumonia.  Given significant enlargement and  hypoxia, consider thoracentesis.  I discussed with Dr. Billie Ruddy  HLD --LDL 36.  Continue treatment with atorvastatin.  Disposition: Patient with multiple admissions over the last 3 to 4 months for multiple reasons.  Her appetite is very poor and she seems to be not progressing very well.  I did discuss this with the patient and her daughter.  Should consider hospice care but the daughter is hopeful that the patient will regain her strength and appetite.   For questions or updates, please contact [place] Please consult [email or web address] for contact info under        Signed, Kathlyn Sacramento, MD  05/23/2020, 11:28 AM

## 2020-05-23 NOTE — Progress Notes (Signed)
PROGRESS NOTE   HPI was taken from Dr. Blaine Hamper: Amy [name] is a 87 y.o. female with medical history significant of HTN, HLD, DM, COPD, CAD with DES, hypothyroidism, A fib not on AC, seizure, CKD-IV, anemia, dCHF, GIB, covid19 infection 03/06/20, presents with SOB and abdominal pain.  Patient states that she started having abdominal pain since yesterday, which is located in the left side of the abdomen, associated with nausea and diarrhea.  She has had 3-4 times of loose stool bowel movement.  She also had fever of 101 in facility, currently body temperature is 97.5 in ED. Patient has cough and shortness of breath which has been progressively worsening.  She was found to have oxygen desaturation to 85% on room air, which improved to 94% on 5 L oxygen.  Denies chest pain.  No rectal bleeding or dark stool.    ED Course: pt was found to have WBC 4.7, lactic acid 2.9, 1.5, INR 1.1, PTT 31, troponin level 638, 933, positive Covid PCR, renal function close to baseline, temperature 97.5, softer blood pressure 93/39, heart rate 98, RR 28.  Chest x-ray showed multifocal infiltration mainly in the left side, vascular congestion and cardiomegaly.  CT of the chest/abdomen/pelvis showed left lower lobe infiltration,  no acute issues intra-abdominally. Pt is admitted to progressive bed as inpatient.  Dr. Rockey Situ of card is consulted.  Hospital Course from Dr. Jimmye Norman 3/26-3/29/22: Pt was found to have pneumonia and NSTEMI. Pt was initially treated w/ IV cefepime for pneumonia and then switched to IV zosyn today as repeat CXR today shows worsening pneumonia. Pt was put on BiPAP overnight for worsening respiratory status secondary to pulmonary edema & worsening pneumonia. BiPAP was able to be weaned off. Of note, pt's NSTEMI has been treated medically as pt is not currently candidate for cardiac cath as per cardio.     Amy [name]  CBJ:[id number] DOB: [birth year] DOA: 05/18/2020 PCP: Alvester Morin,  MD   Assessment & Plan:   Principal Problem:   HCAP (healthcare-associated pneumonia) Active Problems:   Acquired hypothyroidism   HLD (hyperlipidemia)   Essential hypertension   Iron deficiency anemia   Chronic diastolic CHF (congestive heart failure) (HCC)   COPD with chronic bronchitis (HCC)   Severe sepsis (HCC)   CKD (chronic kidney disease) stage 4, GFR 15-29 ml/min (HCC)   HTN (hypertension)   GERD (gastroesophageal reflux disease)   CAD (coronary artery disease)   Non-ST elevation (NSTEMI) myocardial infarction [facility])   Atrial fibrillation (HCC)   Seizure (HCC)   Elevated troponin   Abdominal pain   Diarrhea   Acute respiratory failure with hypoxia (HCC)  Severe sepsis: secondary to HCAP.  Meets critieria w/ tachycardia, tachypnea, elevated lactic acid. Had a Covid infection 03/06/2020, COVID19 PCR still positive. No longer needs isolation. Will not treat COVID19. D/c cefepime and started zosyn Legionella is neg    --cont zosyn for now due to worsening PNA  Large left pleural effusion --order thoracentesis today with fluid studies  NSTEMI:  --troponins >1,000 but trending down.  --s/p IV heparin drip. Currently not a candidate for cardiac cath as per cardio --cont plavix and statin  Acute hypoxic respiratory failure: likely secondary to above.  --currently on 3L --Continue supplemental O2 to keep sats >=92%, wean as tolerated --treat HCAP and pleural effusion  Chronic right shoulder pain:  continue on tramadol prn   Leukocytosis: resolved  Hypothyroidism: continue on levothyroxine   HLD: continue on statin   HTN:  continue  on amlodipine as per cardio   IDA:  S/p 1 unit of pRBCs today for symptomatic anemia.  --cont iron supplement  Chronic diastolic CHF: s/p IV lasix x1. Monitor I/Os. Echo on 02/07/2020 showed EF of 55 to 60%, grade II diastolic dysfunction.  COPD: w/o exacerbation. Continue on bronchodilators and encourage incentive  spirometry   CKDIV: Cr is trending down today. Avoid nephrotoxic meds   Hypokalemia:  --monitor and replete PRN with oral potassium  Hypomagnesemia:  --monitor and replete PRN with IV mag  GERD: continue on pantoprazole   CAD: continue on statin, plavix.  Continue on tele   A. fib: likely PAF. Not on anticoagulation likely secondary to high fall risk   Seizures: continue on home dose of keppra. Seizure precautions     Abdominal pain and diarrhea: resolved     DVT prophylaxis: SCDs  Code Status: DNR Family Communication: son updated at bedside today Disposition Plan:  Therapy recs SNF   Level of care: Progressive Cardiac   Status is: Inpatient  Remains inpatient appropriate because:Unsafe d/c plan, IV treatments appropriate due to intensity of illness or inability to take PO and Inpatient level of care appropriate due to severity of illness   Dispo: The patient is from: SNF              Anticipated d/c is to: SNF              Patient currently is not medically stable to d/c.  Still on 3L O2, pending thoractensis    Difficult to place patient Yes     Consultants:   Cardio    Procedures:  Antimicrobials:   Subjective: Pt has become more somnolent, and not eating much.   Objective: Vitals:   05/23/20 0724 05/23/20 0749 05/23/20 1200 05/23/20 1516  BP:  (!) 133/36 (!) 116/45 (!) 168/47  Pulse: 64 (!) 56 73 70  Resp:  16 17 16   Temp:  (!) 97.5 F (36.4 C) 98.5 F (36.9 C) 98.7 F (37.1 C)  TempSrc:  Oral Oral Oral  SpO2: 99% 100% 100% 95%  Weight:      Height:        Intake/Output Summary (Last 24 hours) at 05/23/2020 1732 Last data filed at 05/23/2020 1357 Gross per 24 hour  Intake 0 ml  Output --  Net 0 ml   Filed Weights   05/18/20 0853 05/20/20 0344 05/22/20 0504  Weight: 86.2 kg 83.5 kg 82.5 kg    Examination:  Constitutional: NAD, somnolent, awake to voice, but not very responsive. HEENT: conjunctivae and lids normal,  EOMI CV: No cyanosis.   RESP: normal respiratory effort, on 3L Extremities: No effusions, edema in BLE.  Both UE's swollen.  SKIN: warm, dry    Data Reviewed: I have personally reviewed following labs and imaging studies  CBC: Recent Labs  Lab 05/18/20 0900 05/19/20 0731 05/20/20 0025 05/20/20 1536 05/21/20 0318 05/21/20 1940 05/22/20 0052 05/22/20 0244 05/22/20 1552 05/23/20 0452  WBC 4.7   < > 14.2*  --  12.3*  --  10.0 10.0  --  6.9  NEUTROABS 3.7  --   --   --   --   --   --   --   --   --   HGB 10.2*   < > 7.0*   < > 7.2* 8.3* 8.3* 7.3* 8.8* 8.7*  HCT 33.6*   < > 22.6*   < > 23.2* 25.5* 27.2* 24.3* 28.4* 28.3*  MCV 98.2   < >  97.8  --  97.1  --  98.6 100.4*  --  95.6  PLT 260   < > 204  --  219  --  221 200  --  191   < > = values in this interval not displayed.   Basic Metabolic Panel: Recent Labs  Lab 05/19/20 0731 05/20/20 0025 05/20/20 0457 05/21/20 0318 05/22/20 0244 05/23/20 0452  NA 140 137  --  141 139 141  K 3.6 3.4*  --  4.1 4.7 4.6  CL 108 107  --  110 110 111  CO2 26 23  --  24 23 24   GLUCOSE 93 99  --  101* 109* 77  BUN 40* 42*  --  43* 41* 42*  CREATININE 1.71* 1.45*  --  1.53* 1.49* 1.55*  CALCIUM 7.5* 7.6*  --  7.8* 7.9* 8.3*  MG  --   --  1.4* 1.9 1.8 1.9  PHOS  --   --  3.6  --   --   --    GFR: Estimated Creatinine Clearance: 23.5 mL/min (A) (by C-G formula based on SCr of 1.55 mg/dL (H)). Liver Function Tests: Recent Labs  Lab 05/18/20 0900 05/22/20 0244  AST 19 12*  ALT 8 8  ALKPHOS 80 64  BILITOT 0.9 0.6  PROT 5.6* 4.7*  ALBUMIN 3.0* 2.2*   No results for input(s): LIPASE, AMYLASE in the last 168 hours. No results for input(s): AMMONIA in the last 168 hours. Coagulation Profile: Recent Labs  Lab 05/18/20 0900  INR 1.2   Cardiac Enzymes: No results for input(s): CKTOTAL, CKMB, CKMBINDEX, TROPONINI in the last 168 hours. BNP (last 3 results) No results for input(s): PROBNP in the last 8760 hours. HbA1C: No results  for input(s): HGBA1C in the last 72 hours. CBG: Recent Labs  Lab 05/18/20 2154 05/21/20 2354 05/23/20 0751  GLUCAP 125* 115* 78   Lipid Profile: No results for input(s): CHOL, HDL, LDLCALC, TRIG, CHOLHDL, LDLDIRECT in the last 72 hours. Thyroid Function Tests: Recent Labs    05/22/20 0052  TSH 12.221*   Anemia Panel: No results for input(s): VITAMINB12, FOLATE, FERRITIN, TIBC, IRON, RETICCTPCT in the last 72 hours. Sepsis Labs: Recent Labs  Lab 05/18/20 0900 05/18/20 1110 05/19/20 0731 05/19/20 2136 05/20/20 0021 05/20/20 0025 05/23/20 0452  PROCALCITON 5.26  --  14.80  --   --  8.46 2.87  LATICACIDVEN 2.9* 1.5  --  0.9 0.8  --   --     Recent Results (from the past 240 hour(s))  Blood Culture (routine x 2)     Status: None   Collection Time: 05/18/20  9:00 AM   Specimen: BLOOD  Result Value Ref Range Status   Specimen Description BLOOD LEFT ANTECUBITAL  Final   Special Requests   Final    BOTTLES DRAWN AEROBIC AND ANAEROBIC Blood Culture results may not be optimal due to an inadequate volume of blood received in culture bottles   Culture   Final    NO GROWTH 5 DAYS Performed at [facility], [address], [place], [place] [address]    Report Status 05/23/2020 FINAL  Final  Blood Culture (routine x 2)     Status: None   Collection Time: 05/18/20  9:00 AM   Specimen: BLOOD  Result Value Ref Range Status   Specimen Description BLOOD BLOOD LEFT HAND  Final   Special Requests   Final    BOTTLES DRAWN AEROBIC AND ANAEROBIC Blood Culture results may  not be optimal due to an inadequate volume of blood received in culture bottles   Culture   Final    NO GROWTH 5 DAYS Performed at [facility], [address] [place], [place] [address]    Report Status 05/23/2020 FINAL  Final  Resp Panel by RT-PCR (Flu A&B, Covid) Nasopharyngeal Swab     Status: Abnormal   Collection Time: 05/18/20 10:04 AM   Specimen: Nasopharyngeal Swab; Nasopharyngeal(NP)  swabs in vial transport medium  Result Value Ref Range Status   SARS Coronavirus 2 by RT PCR POSITIVE (A) NEGATIVE Corrected    Comment: RESULT CALLED TO, READ BACK BY AND VERIFIED WITH:  TAYLOR LEWIS AT 1115 05/18/20 SDR (NOTE) SARS-CoV-2 target nucleic acids are DETECTED.  The SARS-CoV-2 RNA is generally detectable in upper respiratory specimens during the acute phase of infection. Positive results are indicative of the presence of the identified virus, but do not rule out bacterial infection or co-infection with other pathogens not detected by the test. Clinical correlation with patient history and other diagnostic information is necessary to determine patient infection status. The expected result is Negative.  Fact Sheet for Patients: EntrepreneurPulse.com.au  Fact Sheet for Healthcare Providers: IncredibleEmployment.be  This test is not yet approved or cleared by the [place] FDA and  has been authorized for detection and/or diagnosis of SARS-CoV-2 by FDA under an Emergency Use Authorization (EUA).  This EUA will remain in effect (meaning this test can be  used) for the duration of  the COVID-19 declaration under Section 564(b)(1) of the Act, 21 U.S.C. section 360bbb-3(b)(1), unless the authorization is terminated or revoked sooner.  CORRECTED ON 03/26 AT 5035: PREVIOUSLY REPORTED AS POSITIVE RESULT CALLED TO, READ BACK BY AND VERIFIED WITH:  Lovena Le LEWIS AT 4656 04/20/20 SDR    Influenza A by PCR NEGATIVE NEGATIVE Final   Influenza B by PCR NEGATIVE NEGATIVE Final    Comment: (NOTE) The Xpert Xpress SARS-CoV-2/FLU/RSV plus assay is intended as an aid in the diagnosis of influenza from Nasopharyngeal swab specimens and should not be used as a sole basis for treatment. Nasal washings and aspirates are unacceptable for Xpert Xpress SARS-CoV-2/FLU/RSV testing.  Fact Sheet for Patients: EntrepreneurPulse.com.au  Fact  Sheet for Healthcare Providers: IncredibleEmployment.be  This test is not yet approved or cleared by the [place] FDA and has been authorized for detection and/or diagnosis of SARS-CoV-2 by FDA under an Emergency Use Authorization (EUA). This EUA will remain in effect (meaning this test can be used) for the duration of the COVID-19 declaration under Section 564(b)(1) of the Act, 21 U.S.C. section 360bbb-3(b)(1), unless the authorization is terminated or revoked.  Performed at [facility], [address], [place], [place] [address]   Urine culture     Status: None   Collection Time: 05/18/20 11:45 AM   Specimen: Urine, Random  Result Value Ref Range Status   Specimen Description   Final    URINE, RANDOM Performed at [facility], [address] [place], [place] [address]    Special Requests   Final    NONE Performed at [facility], [address], [place], [place] [address]    Culture   Final    NO GROWTH Performed at [facility], [place] [address] [place], [place] [address]    Report Status 05/19/2020 FINAL  Final  MRSA PCR Screening     Status: None   Collection Time: 05/18/20  3:48 PM   Specimen: Nasal Mucosa;  Nasopharyngeal  Result Value Ref Range Status   MRSA by PCR NEGATIVE NEGATIVE Final    Comment:        The GeneXpert MRSA Assay (FDA approved for NASAL specimens only), is one component of a comprehensive MRSA colonization surveillance program. It is not intended to diagnose MRSA infection nor to guide or monitor treatment for MRSA infections. Performed at [facility], [address] [place], [place] [address]   MRSA PCR Screening     Status: None   Collection Time: 05/23/20  8:16 AM   Specimen: Nasal Mucosa; Nasopharyngeal  Result Value Ref Range Status   MRSA by PCR NEGATIVE NEGATIVE Final    Comment:        The GeneXpert MRSA Assay (FDA approved for NASAL specimens only), is one  component of a comprehensive MRSA colonization surveillance program. It is not intended to diagnose MRSA infection nor to guide or monitor treatment for MRSA infections. Performed at [facility], [address], [place], [place] [address]          Radiology Studies: DG Chest [place] 1 View  Result Date: 05/22/2020 CLINICAL DATA:  Hypoxia EXAM: PORTABLE CHEST 1 VIEW COMPARISON:  05/18/2020 FINDINGS: Large left pleural effusion with worsening left upper consolidation. Chronic right humeral head fracture. Mild right chest opacity. IMPRESSION: Large left pleural effusion with worsening left upper consolidation. Electronically Signed   By: Ulyses Jarred M.D.   On: 05/22/2020 01:44   CT HEAD CODE STROKE WO CONTRAST`  Result Date: 05/22/2020 CLINICAL DATA:  Code stroke.  Acute neurologic deficit EXAM: CT HEAD WITHOUT CONTRAST TECHNIQUE: Contiguous axial images were obtained from the base of the skull through the vertex without intravenous contrast. COMPARISON:  None. FINDINGS: Brain: There is no mass, hemorrhage or extra-axial collection. The size and configuration of the ventricles and extra-axial CSF spaces are normal. There is hypoattenuation of the periventricular white matter, most commonly indicating chronic ischemic microangiopathy. Vascular: No abnormal hyperdensity of the major intracranial arteries or dural venous sinuses. No intracranial atherosclerosis. Skull: The visualized skull base, calvarium and extracranial soft tissues are normal. Sinuses/Orbits: No fluid levels or advanced mucosal thickening of the visualized paranasal sinuses. No mastoid or middle ear effusion. The orbits are normal. ASPECTS [facility] Stroke Program Early CT Score) - Ganglionic level infarction (caudate, lentiform nuclei, internal capsule, insula, M1-M3 cortex): 7 - Supraganglionic infarction (M4-M6 cortex): 3 Total score (0-10 with 10 being normal): 10 IMPRESSION: 1. Chronic ischemic microangiopathy  without acute intracranial abnormality. 2. ASPECTS is 10. These results were called by telephone at the time of interpretation on 05/22/2020 at 12:35 am to provider [facility] , who verbally acknowledged these results. Electronically Signed   By: Ulyses Jarred M.D.   On: 05/22/2020 00:44   ECHOCARDIOGRAM LIMITED  Result Date: 05/23/2020    ECHOCARDIOGRAM LIMITED REPORT   Patient Name:   KAILEIA FLOW Date of Exam: 05/23/2020 Medical Rec #:  [id number]          Height:       62.0 in Accession #:    [id number]         Weight:       181.8 lb Date of Birth:  May 20, 1927          BSA:          1.836 m Patient Age:    89 years           BP:  133/36 mmHg Patient Gender: F                  HR:           56 bpm. Exam Location:  [facility] Procedure: Limited Echo, Color Doppler and Cardiac Doppler Indications:     Chest pain R07.9                  NSTEMI I21.4  History:         Patient has prior history of Echocardiogram examinations, most                  recent 02/07/2020. Risk Factors:Hypertension and Diabetes.  Sonographer:     Sherrie Sport RDCS (AE) Referring Phys:  [id number] Arvil Chaco Diagnosing Phys: Kathlyn Sacramento MD IMPRESSIONS  1. Left ventricular ejection fraction, by estimation, is 55 to 60%. The left ventricle has normal function. Left ventricular endocardial border not optimally defined to evaluate regional wall motion. Left ventricular diastolic function could not be evaluated.  2. Right ventricular systolic function is normal. The right ventricular size is normal. Tricuspid regurgitation signal is inadequate for assessing PA pressure.  3. Left atrial size was mildly dilated.  4. Moderate pleural effusion.  5. The mitral valve is abnormal. No evidence of mitral valve regurgitation. No evidence of mitral stenosis. Moderate mitral annular calcification.  6. The aortic valve is normal in structure. Aortic valve regurgitation is not visualized. Mild aortic valve stenosis. No gradients were  measured.  7. limited and challenging study. FINDINGS  Left Ventricle: Left ventricular ejection fraction, by estimation, is 55 to 60%. The left ventricle has normal function. Left ventricular endocardial border not optimally defined to evaluate regional wall motion. The left ventricular internal cavity size was normal in size. There is no left ventricular hypertrophy. Left ventricular diastolic function could not be evaluated. Right Ventricle: The right ventricular size is normal. No increase in right ventricular wall thickness. Right ventricular systolic function is normal. Tricuspid regurgitation signal is inadequate for assessing PA pressure. Left Atrium: Left atrial size was mildly dilated. Right Atrium: Right atrial size was normal in size. Pericardium: There is no evidence of pericardial effusion. Mitral Valve: The mitral valve is abnormal. Moderate mitral annular calcification. No evidence of mitral valve stenosis. Tricuspid Valve: The tricuspid valve is normal in structure. Tricuspid valve regurgitation is not demonstrated. No evidence of tricuspid stenosis. Aortic Valve: The aortic valve is normal in structure. Aortic valve regurgitation is not visualized. Mild aortic stenosis is present. Pulmonic Valve: The pulmonic valve was normal in structure. Pulmonic valve regurgitation is not visualized. No evidence of pulmonic stenosis. Aorta: The aortic root is normal in size and structure. Venous: The inferior vena cava was not well visualized. IAS/Shunts: No atrial level shunt detected by color flow Doppler. Additional Comments: There is a moderate pleural effusion. LEFT VENTRICLE PLAX 2D LVIDd:         3.92 cm LVIDs:         2.74 cm LV PW:         1.54 cm LV IVS:        0.97 cm LVOT diam:     2.00 cm LVOT Area:     3.14 cm  LEFT ATRIUM         Index LA diam:    5.10 cm 2.78 cm/m                        PULMONIC VALVE  AORTA                 PV Vmax:        1.12 m/s Ao Root diam: 2.40 cm PV Peak grad:   5.0 mmHg                        RVOT Peak grad: 12 mmHg   SHUNTS Systemic Diam: 2.00 cm Kathlyn Sacramento MD Electronically signed by Kathlyn Sacramento MD Signature Date/Time: 05/23/2020/10:19:48 AM    Final         Scheduled Meds: . amLODipine  2.5 mg Oral Daily  . atorvastatin  20 mg Oral Daily  . Chlorhexidine Gluconate Cloth  6 each Topical Q0600  . clopidogrel  75 mg Oral Daily  . dextromethorphan-guaiFENesin  1 tablet Oral BID  . ipratropium-albuterol  3 mL Nebulization BID  . iron polysaccharides  150 mg Oral Daily  . levETIRAcetam  250 mg Oral BID  . levothyroxine  100 mcg Oral Q0600  . mometasone-formoterol  2 puff Inhalation BID  . montelukast  10 mg Oral QHS  . mupirocin ointment  1 application Nasal BID  . pantoprazole  20 mg Oral Daily  . pregabalin  100 mg Oral QHS  . pregabalin  50 mg Oral Daily  . sodium chloride flush  10-40 mL Intracatheter Q12H   Continuous Infusions: . piperacillin-tazobactam (ZOSYN)  IV 3.375 g (05/23/20 1351)     LOS: 5 days     Enzo Bi, MD [facility] Pager [phone]  If 7PM-7AM, please contact night-coverage 05/23/2020, 5:32 PM

## 2020-05-24 ENCOUNTER — Inpatient Hospital Stay: Payer: Medicare Other

## 2020-05-24 DIAGNOSIS — N179 Acute kidney failure, unspecified: Secondary | ICD-10-CM

## 2020-05-24 DIAGNOSIS — J90 Pleural effusion, not elsewhere classified: Secondary | ICD-10-CM | POA: Diagnosis not present

## 2020-05-24 DIAGNOSIS — R6520 Severe sepsis without septic shock: Secondary | ICD-10-CM

## 2020-05-24 DIAGNOSIS — I25118 Atherosclerotic heart disease of native coronary artery with other forms of angina pectoris: Secondary | ICD-10-CM

## 2020-05-24 DIAGNOSIS — R569 Unspecified convulsions: Secondary | ICD-10-CM

## 2020-05-24 DIAGNOSIS — J9601 Acute respiratory failure with hypoxia: Secondary | ICD-10-CM | POA: Diagnosis not present

## 2020-05-24 DIAGNOSIS — I214 Non-ST elevation (NSTEMI) myocardial infarction: Secondary | ICD-10-CM | POA: Diagnosis not present

## 2020-05-24 DIAGNOSIS — A419 Sepsis, unspecified organism: Secondary | ICD-10-CM | POA: Diagnosis not present

## 2020-05-24 DIAGNOSIS — N189 Chronic kidney disease, unspecified: Secondary | ICD-10-CM

## 2020-05-24 DIAGNOSIS — J189 Pneumonia, unspecified organism: Secondary | ICD-10-CM | POA: Diagnosis not present

## 2020-05-24 DIAGNOSIS — N184 Chronic kidney disease, stage 4 (severe): Secondary | ICD-10-CM | POA: Diagnosis not present

## 2020-05-24 LAB — CBC
HCT: 27.6 % — ABNORMAL LOW (ref 36.0–46.0)
Hemoglobin: 8.5 g/dL — ABNORMAL LOW (ref 12.0–15.0)
MCH: 29.5 pg (ref 26.0–34.0)
MCHC: 30.8 g/dL (ref 30.0–36.0)
MCV: 95.8 fL (ref 80.0–100.0)
Platelets: 205 10*3/uL (ref 150–400)
RBC: 2.88 MIL/uL — ABNORMAL LOW (ref 3.87–5.11)
RDW: 15 % (ref 11.5–15.5)
WBC: 15.2 10*3/uL — ABNORMAL HIGH (ref 4.0–10.5)
nRBC: 0 % (ref 0.0–0.2)

## 2020-05-24 LAB — BASIC METABOLIC PANEL
Anion gap: 10 (ref 5–15)
BUN: 43 mg/dL — ABNORMAL HIGH (ref 8–23)
CO2: 22 mmol/L (ref 22–32)
Calcium: 8.4 mg/dL — ABNORMAL LOW (ref 8.9–10.3)
Chloride: 111 mmol/L (ref 98–111)
Creatinine, Ser: 1.84 mg/dL — ABNORMAL HIGH (ref 0.44–1.00)
GFR, Estimated: 26 mL/min — ABNORMAL LOW (ref >60)
Glucose, Bld: 97 mg/dL (ref 70–99)
Potassium: 4.4 mmol/L (ref 3.5–5.1)
Sodium: 143 mmol/L (ref 135–145)

## 2020-05-24 LAB — MAGNESIUM: Magnesium: 1.8 mg/dL (ref 1.7–2.4)

## 2020-05-24 LAB — BODY FLUID CELL COUNT WITH DIFFERENTIAL
Eos, Fluid: 0 %
Lymphs, Fluid: 29 %
Monocyte-Macrophage-Serous Fluid: 12 %
Neutrophil Count, Fluid: 59 %
Total Nucleated Cell Count, Fluid: 1004 cu mm

## 2020-05-24 LAB — PROTEIN, PLEURAL OR PERITONEAL FLUID: Total protein, fluid: <3 g/dL

## 2020-05-24 LAB — PATHOLOGIST SMEAR REVIEW

## 2020-05-24 MED ORDER — SODIUM CHLORIDE 0.9 % IV SOLN: INTRAVENOUS | Status: DC

## 2020-05-24 MED ORDER — BISACODYL 10 MG RE SUPP
10.0000 mg | Freq: Every day | RECTAL | Status: DC
  Administered 2020-05-24 – 2020-06-02 (×3): 10 mg via RECTAL
  Filled 2020-05-24 (×8): qty 1

## 2020-05-24 MED ORDER — PIPERACILLIN-TAZOBACTAM 3.375 G IVPB
3.3750 g | Freq: Two times a day (BID) | INTRAVENOUS | Status: DC
  Administered 2020-05-24: 3.375 g via INTRAVENOUS
  Filled 2020-05-24 (×2): qty 50

## 2020-05-24 MED ORDER — FLEET ENEMA 7-19 GM/118ML RE ENEM
1.0000 | ENEMA | Freq: Every day | RECTAL | Status: DC
  Administered 2020-05-24 – 2020-05-25 (×2): 1 via RECTAL

## 2020-05-24 NOTE — Progress Notes (Signed)
Patient ID: Amy [name], female   DOB: Aug 21, 1927, 87 y.o.   MRN: [id number] [facility] PROGRESS NOTE  Amy [name] ZDG:[id number] DOB: 1927-05-12 DOA: 05/18/2020 PCP: Alvester Morin, MD  HPI/Subjective: Called by nurse that she was unresponsive.  Patient was seen and she was able to answer a few questions but is lethargic.  As per the patient's daughter she has gotten worse every day being here.  She is not eating very much and having some difficulty breathing.  Some abdominal pain but has not had a bowel movement in 5 days.  Weaker grip strength in her right hand.  Admitted with severe sepsis.  Objective: Vitals:   05/24/20 1150 05/24/20 1209  BP: (!) 128/56 (!) 118/46  Pulse: 75 72  Resp:  18  Temp:  98.6 F (37 C)  SpO2: (!) 85% 93%    Intake/Output Summary (Last 24 hours) at 05/24/2020 1332 Last data filed at 05/24/2020 1115 Gross per 24 hour  Intake 120 ml  Output 1200 ml  Net -1080 ml   Filed Weights   05/20/20 0344 05/22/20 0504 05/24/20 0500  Weight: 83.5 kg 82.5 kg 82.6 kg    ROS: Review of Systems  Unable to perform ROS: Acuity of condition  Respiratory: Positive for shortness of breath.   Cardiovascular: Negative for chest pain.  Gastrointestinal: Positive for abdominal pain and constipation.   Exam: Physical Exam HENT:     Head: Normocephalic.     Mouth/Throat:     Pharynx: No oropharyngeal exudate.  Eyes:     General: Lids are normal.     Conjunctiva/sclera: Conjunctivae normal.     Pupils: Pupils are equal, round, and reactive to light.  Cardiovascular:     Rate and Rhythm: Normal rate and regular rhythm.     Heart sounds: Normal heart sounds, S1 normal and S2 normal.  Pulmonary:     Breath sounds: Examination of the right-lower field reveals decreased breath sounds and rhonchi. Examination of the left-lower field reveals decreased breath sounds and rhonchi. Decreased breath sounds and rhonchi present. No wheezing or rales.   Abdominal:     Palpations: Abdomen is soft.     Tenderness: There is abdominal tenderness in the suprapubic area and left lower quadrant.  Musculoskeletal:     Right ankle: Swelling present.     Left ankle: Swelling present.     Comments: Right arm in sling.  Skin:    General: Skin is warm.     Findings: No rash.  Neurological:     Mental Status: She is lethargic.       Data Reviewed: Basic Metabolic Panel: Recent Labs  Lab 05/20/20 0025 05/20/20 0457 05/21/20 0318 05/22/20 0244 05/23/20 0452 05/24/20 0555  NA 137  --  141 139 141 143  K 3.4*  --  4.1 4.7 4.6 4.4  CL 107  --  110 110 111 111  CO2 23  --  24 23 24 22   GLUCOSE 99  --  101* 109* 77 97  BUN 42*  --  43* 41* 42* 43*  CREATININE 1.45*  --  1.53* 1.49* 1.55* 1.84*  CALCIUM 7.6*  --  7.8* 7.9* 8.3* 8.4*  MG  --  1.4* 1.9 1.8 1.9 1.8  PHOS  --  3.6  --   --   --   --    Liver Function Tests: Recent Labs  Lab 05/18/20 0900 05/22/20 0244  AST 19 12*  ALT 8 8  ALKPHOS 80  36  BILITOT 0.9 0.6  PROT 5.6* 4.7*  ALBUMIN 3.0* 2.2*   CBC: Recent Labs  Lab 05/18/20 0900 05/19/20 0731 05/21/20 0318 05/21/20 1940 05/22/20 0052 05/22/20 0244 05/22/20 1552 05/23/20 0452 05/24/20 0555  WBC 4.7   < > 12.3*  --  10.0 10.0  --  6.9 15.2*  NEUTROABS 3.7  --   --   --   --   --   --   --   --   HGB 10.2*   < > 7.2*   < > 8.3* 7.3* 8.8* 8.7* 8.5*  HCT 33.6*   < > 23.2*   < > 27.2* 24.3* 28.4* 28.3* 27.6*  MCV 98.2   < > 97.1  --  98.6 100.4*  --  95.6 95.8  PLT 260   < > 219  --  221 200  --  191 205   < > = values in this interval not displayed.   BNP (last 3 results) Recent Labs    04/23/20 0530 05/18/20 1110 05/22/20 0052  BNP 370.5* 748.3* 939.2*     CBG: Recent Labs  Lab 05/18/20 2154 05/21/20 2354 05/23/20 0751  GLUCAP 125* 115* 78    Recent Results (from the past 240 hour(s))  Blood Culture (routine x 2)     Status: None   Collection Time: 05/18/20  9:00 AM   Specimen: BLOOD   Result Value Ref Range Status   Specimen Description BLOOD LEFT ANTECUBITAL  Final   Special Requests   Final    BOTTLES DRAWN AEROBIC AND ANAEROBIC Blood Culture results may not be optimal due to an inadequate volume of blood received in culture bottles   Culture   Final    NO GROWTH 5 DAYS Performed at [facility], [address] [place], [place] [address]    Report Status 05/23/2020 FINAL  Final  Blood Culture (routine x 2)     Status: None   Collection Time: 05/18/20  9:00 AM   Specimen: BLOOD  Result Value Ref Range Status   Specimen Description BLOOD BLOOD LEFT HAND  Final   Special Requests   Final    BOTTLES DRAWN AEROBIC AND ANAEROBIC Blood Culture results may not be optimal due to an inadequate volume of blood received in culture bottles   Culture   Final    NO GROWTH 5 DAYS Performed at [facility], [address] [place], [place] [address]    Report Status 05/23/2020 FINAL  Final  Resp Panel by RT-PCR (Flu A&B, Covid) Nasopharyngeal Swab     Status: Abnormal   Collection Time: 05/18/20 10:04 AM   Specimen: Nasopharyngeal Swab; Nasopharyngeal(NP) swabs in vial transport medium  Result Value Ref Range Status   SARS Coronavirus 2 by RT PCR POSITIVE (A) NEGATIVE Corrected    Comment: RESULT CALLED TO, READ BACK BY AND VERIFIED WITH:  TAYLOR LEWIS AT 1115 05/18/20 SDR (NOTE) SARS-CoV-2 target nucleic acids are DETECTED.  The SARS-CoV-2 RNA is generally detectable in upper respiratory specimens during the acute phase of infection. Positive results are indicative of the presence of the identified virus, but do not rule out bacterial infection or co-infection with other pathogens not detected by the test. Clinical correlation with patient history and other diagnostic information is necessary to determine patient infection status. The expected result is Negative.  Fact Sheet for Patients: EntrepreneurPulse.com.au  Fact Sheet for  Healthcare Providers: IncredibleEmployment.be  This test is not yet approved or cleared by the  [place] Architectural technologist and  has been authorized for detection and/or diagnosis of SARS-CoV-2 by FDA under an Print production planner (EUA).  This EUA will remain in effect (meaning this test can be  used) for the duration of  the COVID-19 declaration under Section 564(b)(1) of the Act, 21 U.S.C. section 360bbb-3(b)(1), unless the authorization is terminated or revoked sooner.  CORRECTED ON 03/26 AT 3016: PREVIOUSLY REPORTED AS POSITIVE RESULT CALLED TO, READ BACK BY AND VERIFIED WITH:  Lovena Le LEWIS AT 0109 04/20/20 SDR    Influenza A by PCR NEGATIVE NEGATIVE Final   Influenza B by PCR NEGATIVE NEGATIVE Final    Comment: (NOTE) The Xpert Xpress SARS-CoV-2/FLU/RSV plus assay is intended as an aid in the diagnosis of influenza from Nasopharyngeal swab specimens and should not be used as a sole basis for treatment. Nasal washings and aspirates are unacceptable for Xpert Xpress SARS-CoV-2/FLU/RSV testing.  Fact Sheet for Patients: EntrepreneurPulse.com.au  Fact Sheet for Healthcare Providers: IncredibleEmployment.be  This test is not yet approved or cleared by the [place] FDA and has been authorized for detection and/or diagnosis of SARS-CoV-2 by FDA under an Emergency Use Authorization (EUA). This EUA will remain in effect (meaning this test can be used) for the duration of the COVID-19 declaration under Section 564(b)(1) of the Act, 21 U.S.C. section 360bbb-3(b)(1), unless the authorization is terminated or revoked.  Performed at [facility], [address] [place], [place] [address]   Urine culture     Status: None   Collection Time: 05/18/20 11:45 AM   Specimen: Urine, Random  Result Value Ref Range Status   Specimen Description   Final    URINE, RANDOM Performed at [facility], [address]  [place], [place] [address]    Special Requests   Final    NONE Performed at [facility], [address] [place], [place] [address]    Culture   Final    NO GROWTH Performed at [facility], [place] [address] [place], [place] [address]    Report Status 05/19/2020 FINAL  Final  MRSA PCR Screening     Status: None   Collection Time: 05/18/20  3:48 PM   Specimen: Nasal Mucosa; Nasopharyngeal  Result Value Ref Range Status   MRSA by PCR NEGATIVE NEGATIVE Final    Comment:        The GeneXpert MRSA Assay (FDA approved for NASAL specimens only), is one component of a comprehensive MRSA colonization surveillance program. It is not intended to diagnose MRSA infection nor to guide or monitor treatment for MRSA infections. Performed at [facility], [address] [place], [place] [address]   MRSA PCR Screening     Status: None   Collection Time: 05/23/20  8:16 AM   Specimen: Nasal Mucosa; Nasopharyngeal  Result Value Ref Range Status   MRSA by PCR NEGATIVE NEGATIVE Final    Comment:        The GeneXpert MRSA Assay (FDA approved for NASAL specimens only), is one component of a comprehensive MRSA colonization surveillance program. It is not intended to diagnose MRSA infection nor to guide or monitor treatment for MRSA infections. Performed at [facility], [address], [place], [place] [address]      Studies: [facility] Chest [place] 1 View  Result Date: 05/24/2020 CLINICAL DATA:  Post left-sided thoracentesis. EXAM: PORTABLE CHEST 1 VIEW COMPARISON:  Chest radiograph-05/22/2020; 05/18/2020; chest CT-05/18/2020 FINDINGS: Grossly unchanged cardiac silhouette and mediastinal contours with atherosclerotic plaque  within thoracic aorta. Dystrophic calcifications within the mitral valve annulus. Interval reduction/near resolution of left-sided pleural effusion post thoracentesis with improved aeration of the left lung apex. Redemonstrated extensive left mid and  lower lung heterogeneous/consolidative opacities with associated left-sided volume loss and deviation of the cardiomediastinal structures to the left. Pulmonary vasculature within the right lung remains indistinct. No definite right-sided pleural effusion. No acute osseous abnormalities. Redemonstrated chronic displaced fracture involving the anatomic neck of the right humerus, incompletely evaluated. IMPRESSION: 1. Interval reduction/near resolution of left-sided pleural effusion post thoracentesis. No pneumothorax. 2. Otherwise, similar findings of extensive left mid and lower lung heterogeneous/consolidative opacities with associated volume loss. Electronically Signed   By: Sandi Mariscal M.D.   On: 05/24/2020 12:15   ECHOCARDIOGRAM LIMITED  Result Date: 05/23/2020    ECHOCARDIOGRAM LIMITED REPORT   Patient Name:   Amy [name] Date of Exam: 05/23/2020 Medical Rec #:  [id number]          Height:       62.0 in Accession #:    [id number]         Weight:       181.8 lb Date of Birth:  12/16/1927          BSA:          1.836 m Patient Age:    41 years           BP:           133/36 mmHg Patient Gender: F                  HR:           56 bpm. Exam Location:  [facility] Procedure: Limited Echo, Color Doppler and Cardiac Doppler Indications:     Chest pain R07.9                  NSTEMI I21.4  History:         Patient has prior history of Echocardiogram examinations, most                  recent 02/07/2020. Risk Factors:Hypertension and Diabetes.  Sonographer:     Sherrie Sport RDCS (AE) Referring Phys:  [id number] Arvil Chaco Diagnosing Phys: Kathlyn Sacramento MD IMPRESSIONS  1. Left ventricular ejection fraction, by estimation, is 55 to 60%. The left ventricle has normal function. Left ventricular endocardial border not optimally defined to evaluate regional wall motion. Left ventricular diastolic function could not be evaluated.  2. Right ventricular systolic function is normal. The right ventricular size is normal.  Tricuspid regurgitation signal is inadequate for assessing PA pressure.  3. Left atrial size was mildly dilated.  4. Moderate pleural effusion.  5. The mitral valve is abnormal. No evidence of mitral valve regurgitation. No evidence of mitral stenosis. Moderate mitral annular calcification.  6. The aortic valve is normal in structure. Aortic valve regurgitation is not visualized. Mild aortic valve stenosis. No gradients were measured.  7. limited and challenging study. FINDINGS  Left Ventricle: Left ventricular ejection fraction, by estimation, is 55 to 60%. The left ventricle has normal function. Left ventricular endocardial border not optimally defined to evaluate regional wall motion. The left ventricular internal cavity size was normal in size. There is no left ventricular hypertrophy. Left ventricular diastolic function could not be evaluated. Right Ventricle: The right ventricular size is normal. No increase in right ventricular wall thickness. Right ventricular systolic function is normal. Tricuspid regurgitation signal is inadequate for  assessing PA pressure. Left Atrium: Left atrial size was mildly dilated. Right Atrium: Right atrial size was normal in size. Pericardium: There is no evidence of pericardial effusion. Mitral Valve: The mitral valve is abnormal. Moderate mitral annular calcification. No evidence of mitral valve stenosis. Tricuspid Valve: The tricuspid valve is normal in structure. Tricuspid valve regurgitation is not demonstrated. No evidence of tricuspid stenosis. Aortic Valve: The aortic valve is normal in structure. Aortic valve regurgitation is not visualized. Mild aortic stenosis is present. Pulmonic Valve: The pulmonic valve was normal in structure. Pulmonic valve regurgitation is not visualized. No evidence of pulmonic stenosis. Aorta: The aortic root is normal in size and structure. Venous: The inferior vena cava was not well visualized. IAS/Shunts: No atrial level shunt detected by  color flow Doppler. Additional Comments: There is a moderate pleural effusion. LEFT VENTRICLE PLAX 2D LVIDd:         3.92 cm LVIDs:         2.74 cm LV PW:         1.54 cm LV IVS:        0.97 cm LVOT diam:     2.00 cm LVOT Area:     3.14 cm  LEFT ATRIUM         Index LA diam:    5.10 cm 2.78 cm/m                        PULMONIC VALVE AORTA                 PV Vmax:        1.12 m/s Ao Root diam: 2.40 cm PV Peak grad:   5.0 mmHg                       RVOT Peak grad: 12 mmHg   SHUNTS Systemic Diam: 2.00 cm Kathlyn Sacramento MD Electronically signed by Kathlyn Sacramento MD Signature Date/Time: 05/23/2020/10:19:48 AM    Final    US THORACENTESIS ASP PLEURAL SPACE W/IMG GUIDE  Result Date: 05/24/2020 INDICATION: Symptomatic left sided pleural effusion. Please perform ultrasound-guided thoracentesis for diagnostic and therapeutic purposes. EXAM: US THORACENTESIS ASP PLEURAL SPACE W/IMG GUIDE COMPARISON:  Chest radiograph-05/22/2020; 05/18/2020; chest CT-05/18/2020 MEDICATIONS: None. COMPLICATIONS: None immediate. TECHNIQUE: Informed written consent was obtained from the the patient and the patient's daughter after a discussion of the risks, benefits and alternatives to treatment. A timeout was performed prior to the initiation of the procedure. Initial ultrasound scanning demonstrates a small left-sided pleural effusion with dominant component loculated regional to the left upper lobe. The posterior aspect of the left upper/mid chest was prepped and draped in the usual sterile fashion. 1% lidocaine was used for local anesthesia. Under direct ultrasound guidance, an 8 Fr Safe-T-Centesis catheter was introduced. The thoracentesis was performed. The catheter was removed and a dressing was applied. The patient tolerated the procedure well without immediate post procedural complication. The patient was escorted to have an upright chest radiograph. FINDINGS: A total of approximately 250 cc of serous fluid was removed. Requested  samples were sent to the laboratory. IMPRESSION: Successful ultrasound-guided left sided thoracentesis yielding 250 cc of pleural fluid. Electronically Signed   By: Sandi Mariscal M.D.   On: 05/24/2020 12:20    Scheduled Meds: . atorvastatin  20 mg Oral Daily  . bisacodyl  10 mg Rectal QHS  . Chlorhexidine Gluconate Cloth  6 each Topical Q0600  . clopidogrel  75 mg  Oral Daily  . dextromethorphan-guaiFENesin  1 tablet Oral BID  . ipratropium-albuterol  3 mL Nebulization BID  . iron polysaccharides  150 mg Oral Daily  . levETIRAcetam  250 mg Oral BID  . levothyroxine  100 mcg Oral Q0600  . mometasone-formoterol  2 puff Inhalation BID  . montelukast  10 mg Oral QHS  . mupirocin ointment  1 application Nasal BID  . pantoprazole  20 mg Oral Daily  . sodium chloride flush  10-40 mL Intracatheter Q12H  . sodium phosphate  1 enema Rectal Daily   Continuous Infusions: . sodium chloride 30 mL/hr at 05/24/20 1223  . piperacillin-tazobactam (ZOSYN)  IV 3.375 g (05/24/20 0523)    Assessment/Plan:   1. Severe sepsis, present on admission with multifocal pneumonia, left pleural effusion. Patient currently on Zosyn.  Left-sided thoracentesis today removed 250 mL of fluid.  Will continue to monitor cultures.  White blood cell count higher today 2. Acute metabolic encephalopathy.  This has gotten worse over the last couple days.  Could be secondary to sepsis and infection.  Will repeat CT scan of the head make sure she did not have a stroke.  Follow tramadol.  Hold Lyrica. 3. NSTEMI medical management.  Patient on Plavix and atorvastatin.  Heparin drip was given for 2 days on presentation 4. Acute hypoxic respiratory failure secondary sepsis and pneumonia.  Patient on room air today but did have a borderline saturation but repeat saturation was good. 5. Chronic right shoulder pain on tramadol as needed 6. Acute kidney injury on chronic kidney disease stage IIIb.  Creatinine 1.84 today was 1.55 yesterday.   Start IV fluids. 7. Anemia likely of chronic disease.  Continue to monitor.  Patient on iron supplementation 8. Paroxysmal atrial fibrillation 9. Essential hypertension hold amlodipine 10. Patient's daughter declined palliative care consultation 32. Seizure disorder on Keppra     Code Status:     Code Status Orders  (From admission, onward)         Start     Ordered   05/18/20 1439  Do not attempt resuscitation (DNR)  Continuous       Question Answer Comment  In the event of cardiac or respiratory ARREST Do not call a "code blue"   In the event of cardiac or respiratory ARREST Do not perform Intubation, CPR, defibrillation or ACLS   In the event of cardiac or respiratory ARREST Use medication by any route, position, wound care, and other measures to relive pain and suffering. May use oxygen, suction and manual treatment of airway obstruction as needed for comfort.      05/18/20 1439        Code Status History    Date Active Date Inactive Code Status Order ID Comments User Context   04/23/2020 1851 04/24/2020 2335 DNR [id number]  Ivor Costa, MD Inpatient   03/06/2020 1332 03/08/2020 2259 DNR [id number]  Gwynne Edinger, MD ED   02/02/2020 2244 02/14/2020 1927 DNR [id number]  Rhetta Mura, DO ED   02/02/2020 1745 02/02/2020 2244 DNR [id number]  Rhetta Mura, DO ED   08/18/2019 1932 08/23/2019 2056 DNR [id number]  Athena Masse, MD ED   08/12/2019 1610 08/15/2019 1935 DNR [id number]  Ivor Costa, MD ED   08/12/2019 1447 08/12/2019 1610 DNR [id number]  Merlyn Lot, MD ED   08/06/2019 1455 08/07/2019 1551 DNR [id number]  Max Sane, MD ED   05/16/2017 0217 05/20/2017 1758 DNR [id number]  Lance Coon, MD Inpatient   09/21/2016 1245 09/26/2016  [place] DNR [id number]  [name], MD ED   10/01/2015 1144 10/02/2015 0748 Full Code [id number]  Bettey Costa, MD Inpatient   Advance Care Planning Activity     Family Communication: Spoke with daughter on the phone Disposition Plan: Status is:  Inpatient  Dispo: The patient is from: Long-term care              Anticipated d/c is to: Long-term care              Patient currently not medically stable   Difficult to place patient.  No.  Antibiotics:  zosyn  Time spent: 28 minutes  Arnold Line

## 2020-05-24 NOTE — Progress Notes (Signed)
Progress Note  Patient Name: Amy [name] Date of Encounter: 05/24/2020  Primary Cardiologist: Rockey Situ  Subjective   Somnolent this morning. Denies any pain.  Inpatient Medications    Scheduled Meds: . atorvastatin  20 mg Oral Daily  . bisacodyl  10 mg Rectal QHS  . Chlorhexidine Gluconate Cloth  6 each Topical Q0600  . clopidogrel  75 mg Oral Daily  . dextromethorphan-guaiFENesin  1 tablet Oral BID  . ipratropium-albuterol  3 mL Nebulization BID  . iron polysaccharides  150 mg Oral Daily  . levETIRAcetam  250 mg Oral BID  . levothyroxine  100 mcg Oral Q0600  . mometasone-formoterol  2 puff Inhalation BID  . montelukast  10 mg Oral QHS  . mupirocin ointment  1 application Nasal BID  . pantoprazole  20 mg Oral Daily  . sodium chloride flush  10-40 mL Intracatheter Q12H  . sodium phosphate  1 enema Rectal Daily   Continuous Infusions: . sodium chloride    . piperacillin-tazobactam (ZOSYN)  IV 3.375 g (05/24/20 0523)   PRN Meds: acetaminophen, albuterol, docusate sodium, melatonin, nitroGLYCERIN, ondansetron (ZOFRAN) IV, polyethylene glycol, sodium chloride flush, traMADol   Vital Signs    Vitals:   05/24/20 0444 05/24/20 0500 05/24/20 0745 05/24/20 0812  BP: (!) 112/40  (!) 95/41 (!) 123/44  Pulse: 68  66 70  Resp:   15 16  Temp: 98.1 F (36.7 C)  97.9 F (36.6 C) 98 F (36.7 C)  TempSrc: Axillary  Oral Axillary  SpO2: 100%  100% 93%  Weight:  82.6 kg    Height:        Intake/Output Summary (Last 24 hours) at 05/24/2020 1130 Last data filed at 05/24/2020 1115 Gross per 24 hour  Intake 120 ml  Output 1200 ml  Net -1080 ml   Filed Weights   05/20/20 0344 05/22/20 0504 05/24/20 0500  Weight: 83.5 kg 82.5 kg 82.6 kg    Telemetry    SR with rates in the 70s to 80s bpm with 1st degree AV block - Personally Reviewed  ECG    No new tracings - Personally Reviewed  Physical Exam   GEN: No acute distress.   Neck: No JVD. Cardiac: RRR, I/VI  systolic murmur, no rubs, or gallops.  Respiratory: Diminished breath sounds, particularly at the left base. Poor inspiratory effort.  GI: Soft, nontender, non-distended.   MS: No edema; No deformity. Neuro:  Somnolent.  Psych: Somnolent.  Labs    Chemistry Recent Labs  Lab 05/18/20 0900 05/19/20 0731 05/22/20 0244 05/23/20 0452 05/24/20 0555  NA 142   < > 139 141 143  K 4.6   < > 4.7 4.6 4.4  CL 108   < > 110 111 111  CO2 25   < > 23 24 22   GLUCOSE 119*   < > 109* 77 97  BUN 34*   < > 41* 42* 43*  CREATININE 1.66*   < > 1.49* 1.55* 1.84*  CALCIUM 7.7*   < > 7.9* 8.3* 8.4*  PROT 5.6*  --  4.7*  --   --   ALBUMIN 3.0*  --  2.2*  --   --   AST 19  --  12*  --   --   ALT 8  --  8  --   --   ALKPHOS 80  --  64  --   --   BILITOT 0.9  --  0.6  --   --  GFRNONAA 29*   < > 33* 31* 26*  ANIONGAP 9   < > 6 6 10    < > = values in this interval not displayed.     Hematology Recent Labs  Lab 05/22/20 0244 05/22/20 1552 05/23/20 0452 05/24/20 0555  WBC 10.0  --  6.9 15.2*  RBC 2.42*  --  2.96* 2.88*  HGB 7.3* 8.8* 8.7* 8.5*  HCT 24.3* 28.4* 28.3* 27.6*  MCV 100.4*  --  95.6 95.8  MCH 30.2  --  29.4 29.5  MCHC 30.0  --  30.7 30.8  RDW 15.2  --  15.9* 15.0  PLT 200  --  191 205    Cardiac EnzymesNo results for input(s): TROPONINI in the last 168 hours. No results for input(s): TROPIPOC in the last 168 hours.   BNP Recent Labs  Lab 05/18/20 1110 05/22/20 0052  BNP 748.3* 939.2*     DDimer  Recent Labs  Lab 05/22/20 0052  DDIMER 1.99*     Radiology    CXR 05/22/2020: IMPRESSION: Large left pleural effusion with worsening left upper consolidation. __________  CT head 05/22/2020: IMPRESSION: 1. Chronic ischemic microangiopathy without acute intracranial abnormality. 2. ASPECTS is 10. __________  CT chest 05/18/2020: IMPRESSION: 1. Extensive left-sided pneumonia opacifying most of the left lung. Small left parapneumonic effusion. 2. No acute  intra-abdominal finding. 3. Numerous chronic/known incidental findings which are described Above. __________  CXR 05/18/2020: IMPRESSION: Widespread airspace opacity throughout much of the left lung consistent with multifocal pneumonia. Right lung clear. There is cardiomegaly with a degree of pulmonary vascular congestion. There is calcification in each carotid artery. Aortic Atherosclerosis (ICD10-I70.0).  Cardiac Studies   Limited echo 05/23/2020: 1. Left ventricular ejection fraction, by estimation, is 55 to 60%. The  left ventricle has normal function. Left ventricular endocardial border  not optimally defined to evaluate regional wall motion. Left ventricular  diastolic function could not be  evaluated.  2. Right ventricular systolic function is normal. The right ventricular  size is normal. Tricuspid regurgitation signal is inadequate for assessing  PA pressure.  3. Left atrial size was mildly dilated.  4. Moderate pleural effusion.  5. The mitral valve is abnormal. No evidence of mitral valve  regurgitation. No evidence of mitral stenosis. Moderate mitral annular  calcification.  6. The aortic valve is normal in structure. Aortic valve regurgitation is  not visualized. Mild aortic valve stenosis. No gradients were measured.  7. limited and challenging study. __________  Carlton Adam MPI 03/2017: Lexiscan MPI 04/23/2017: Pharmacological myocardial perfusion imaging study with no significant Ischemia Small region of fixed apical thinning, consistent with attenuation artifact GI uptake artifact noted Normal wall motion, EF estimated at 56% No EKG changes concerning for ischemia at peak stress or in recovery. Low risk scan  Patient Profile     87 y.o. female with history of CAD s/p LAD stenting to 2011, HFpEF, PAF not on [place] secondary to GI bleed and frequent falls carotid artery disease, DM, CKD stage IV, HTN with RAS s/p renal artery stenting in 01/2019, Covid in  02/2020, HLD, COPD, anemia of chronic disease, morbid obesity, beta blocker-induced bradycardia, and shingles, who was admitted with PNA and we are seeing for elevated HS-Tn.   Assessment & Plan    1. NSTEMI -Patient with known CAD with prior LAD stenting in 2011 -Mildly elevated high sensitivity troponin peaking at 1196 felt to be related to supply demand ischemia in the setting of sepsis related to PNA, COPD, hypotension, CKD  stage IV, and anemia of chronic disease -Echo this admission demonstrated a preserved LVSF  -Status post treatment with IV heparin, which was discontinued after 48 hours of therapy  -Plavix without aspirin given prior GI bleed and frequent falls  -Not on beta blocker given prior beta blocker-induced bradycardia  -Given age and comorbid conditions, interventional cardiology does not recommend ischemic testing  2. PAF: -Not on [place] given history of GI bleed and frequent falls -Currently in sinus rhythm with rates in the 70s to 80s bpm -Not on a beta blocker as above  3. Left pleural effusion/HFpEF: -Given IV fluids upon initial presentation in the setting of sepsis with PNA and hypotension, which is possibly contributing to her effusion, vs in the setting of her underlying PNA vs third spacing in the settng hypoalbuminemia -Thoracentesis is ordered per primary service, though uncertain if she would be a candidate given somnolence  -Given IV Lasix 60 mg x1 3/29  4. HTN: -Blood pressure is well controlled on amlodipine   5. HLD: -LDL 36 -Continue Lipitor   6. Sepsis with PNA: -Per IM  7. CKD stage IV: -Renal function is overall relatively stable  8. Anemia of chronic disease: -Stable  9. Acute hypoxic respiratory failure: -Has required BiPAP, currently on supplemental oxygen via nasal cannula at 3 L -Likely in the setting of underlying PNA, HFpEF, and pleural effusion -Thoracentesis has been ordered as above, given somnolence noted today, uncertain if she  would be a candidate for this at this time     For questions or updates, please contact [facility] Please consult [email or web address] for contact info under Cardiology/STEMI.    Signed, Christell Faith, PA-C [place] Pager: [phone] 05/24/2020, 11:30 AM

## 2020-05-24 NOTE — Progress Notes (Signed)
OT Cancellation Note  Patient Details Name: Amy [name] MRN: [id number] DOB: 18-Jan-1928   Cancelled Treatment:    Reason Eval/Treat Not Completed: Other (comment). Per chart review, pt still pending thoracentesis. Pt also demonstrating decreased alertness and unable to become alert enough to participate in PT session this AM. OT to attempt at later date/time as appropriate.   Fredirick Maudlin, OTR/L [place]

## 2020-05-24 NOTE — Procedures (Signed)
Pre procedural Dx: Symptomatic pleural effusion Post procedural Dx: Same  Successful US guided left sided thoracentesis yielding 250 cc L of serous pleural fluid.   Samples sent to lab for analysis.  EBL: None Complications: None immediate.  Ronny Bacon, MD Pager #: [phone]

## 2020-05-24 NOTE — Progress Notes (Signed)
PT Cancellation Note  Patient Details Name: Amy [name] MRN: [id number] DOB: 20-Feb-1928   Cancelled Treatment:    Reason Eval/Treat Not Completed: Fatigue/lethargy limiting ability to participate.  Chart reviewed.  Pt still pending thoracentesis.  Nurse reports pt was unable to become alert enough to take medications this morning.  Pt's daughter and granddaughter present upon PT arrival and agreeable to trying PT if pt able to wake up enough to participate.  Therapist attempted vc's and tactile cues but pt unable to open eyes or become alert enough to participate.  Nurse aware of pt's status.  Will re-attempt PT session at a later date/time as able.  Leitha Bleak, PT 05/24/20, 10:50 AM

## 2020-05-25 DIAGNOSIS — J189 Pneumonia, unspecified organism: Secondary | ICD-10-CM | POA: Diagnosis not present

## 2020-05-25 DIAGNOSIS — A419 Sepsis, unspecified organism: Secondary | ICD-10-CM | POA: Diagnosis not present

## 2020-05-25 DIAGNOSIS — D638 Anemia in other chronic diseases classified elsewhere: Secondary | ICD-10-CM

## 2020-05-25 DIAGNOSIS — G9341 Metabolic encephalopathy: Secondary | ICD-10-CM

## 2020-05-25 DIAGNOSIS — J90 Pleural effusion, not elsewhere classified: Secondary | ICD-10-CM | POA: Diagnosis not present

## 2020-05-25 LAB — CBC
HCT: 27.9 % — ABNORMAL LOW (ref 36.0–46.0)
Hemoglobin: 8.8 g/dL — ABNORMAL LOW (ref 12.0–15.0)
MCH: 29.9 pg (ref 26.0–34.0)
MCHC: 31.5 g/dL (ref 30.0–36.0)
MCV: 94.9 fL (ref 80.0–100.0)
Platelets: 190 10*3/uL (ref 150–400)
RBC: 2.94 MIL/uL — ABNORMAL LOW (ref 3.87–5.11)
RDW: 15.7 % — ABNORMAL HIGH (ref 11.5–15.5)
WBC: 13.3 10*3/uL — ABNORMAL HIGH (ref 4.0–10.5)
nRBC: 0 % (ref 0.0–0.2)

## 2020-05-25 LAB — BASIC METABOLIC PANEL
Anion gap: 7 (ref 5–15)
BUN: 42 mg/dL — ABNORMAL HIGH (ref 8–23)
CO2: 22 mmol/L (ref 22–32)
Calcium: 8.3 mg/dL — ABNORMAL LOW (ref 8.9–10.3)
Chloride: 112 mmol/L — ABNORMAL HIGH (ref 98–111)
Creatinine, Ser: 1.74 mg/dL — ABNORMAL HIGH (ref 0.44–1.00)
GFR, Estimated: 27 mL/min — ABNORMAL LOW (ref >60)
Glucose, Bld: 112 mg/dL — ABNORMAL HIGH (ref 70–99)
Potassium: 4.5 mmol/L (ref 3.5–5.1)
Sodium: 141 mmol/L (ref 135–145)

## 2020-05-25 LAB — TYPE AND SCREEN
ABO/RH(D): O POS
Antibody Screen: NEGATIVE

## 2020-05-25 LAB — MAGNESIUM: Magnesium: 1.9 mg/dL (ref 1.7–2.4)

## 2020-05-25 LAB — PROTEIN, BODY FLUID (OTHER): Total Protein, Body Fluid Other: 2.1 g/dL

## 2020-05-25 MED ORDER — ENOXAPARIN SODIUM 30 MG/0.3ML ~~LOC~~ SOLN
30.0000 mg | SUBCUTANEOUS | Status: DC
  Administered 2020-05-25 – 2020-05-31 (×7): 30 mg via SUBCUTANEOUS
  Filled 2020-05-25 (×7): qty 0.3

## 2020-05-25 MED ORDER — PIPERACILLIN-TAZOBACTAM 3.375 G IVPB
3.3750 g | Freq: Three times a day (TID) | INTRAVENOUS | Status: AC
  Administered 2020-05-25 – 2020-05-27 (×8): 3.375 g via INTRAVENOUS
  Filled 2020-05-25 (×7): qty 50

## 2020-05-25 NOTE — Progress Notes (Signed)
   05/25/20 1130  Clinical Encounter Type  Visited With Patient and family together  Visit Type Initial;Spiritual support;Social support  Referral From Chaplain  Consult/Referral To [place] visited with PT and her daughter. Pt's daughter was able to express her emotions. She spoke of her mother having a better day, health wise. Chaplain let her know he will stop back in when her mother is alert. Pt's daughter appreciated the visit

## 2020-05-25 NOTE — Progress Notes (Signed)
Patient ID: Amy [name], female   DOB: 01/24/1928, 87 y.o.   MRN: [id number] [facility] PROGRESS NOTE  Leeandra Ellerson Glodowski OIZ:[id number] DOB: 08-17-1927 DOA: 05/18/2020 PCP: Alvester Morin, MD  HPI/Subjective: Patient awake and talking to me today.  Did not remember seeing me yesterday.  Yesterday was lethargic.  Today feeling a little bit better.  Able to hold a conversation and able to eat.  Initially admitted with severe sepsis and pneumonia.  Objective: Vitals:   05/25/20 0840 05/25/20 1114  BP: (!) 138/51 124/74  Pulse: 72 70  Resp: 18 18  Temp:  98.4 F (36.9 C)  SpO2: 97% 95%    Intake/Output Summary (Last 24 hours) at 05/25/2020 1415 Last data filed at 05/25/2020 1349 Gross per 24 hour  Intake 1251.13 ml  Output 1 ml  Net 1250.13 ml   Filed Weights   05/22/20 0504 05/24/20 0500 05/25/20 0449  Weight: 82.5 kg 82.6 kg 81.5 kg    ROS: Review of Systems  Respiratory: Positive for shortness of breath.   Cardiovascular: Negative for chest pain.  Gastrointestinal: Negative for abdominal pain.   Exam: Physical Exam HENT:     Head: Normocephalic.     Mouth/Throat:     Pharynx: No oropharyngeal exudate.  Eyes:     General: Lids are normal.     Conjunctiva/sclera: Conjunctivae normal.  Cardiovascular:     Rate and Rhythm: Normal rate and regular rhythm.     Heart sounds: Normal heart sounds, S1 normal and S2 normal.  Pulmonary:     Breath sounds: Examination of the right-lower field reveals decreased breath sounds. Examination of the left-lower field reveals decreased breath sounds. Decreased breath sounds present. No wheezing, rhonchi or rales.  Abdominal:     Palpations: Abdomen is soft.     Tenderness: There is no abdominal tenderness.  Musculoskeletal:     Right ankle: Swelling present.     Left ankle: Swelling present.  Skin:    General: Skin is warm.     Findings: No rash.  Neurological:     Mental Status: She is alert.     Comments:  Answers questions appropriately.  Able to straight leg raise.       Data Reviewed: Basic Metabolic Panel: Recent Labs  Lab 05/20/20 0457 05/21/20 0318 05/22/20 0244 05/23/20 0452 05/24/20 0555 05/25/20 0731  NA  --  141 139 141 143 141  K  --  4.1 4.7 4.6 4.4 4.5  CL  --  110 110 111 111 112*  CO2  --  24 23 24 22 22   GLUCOSE  --  101* 109* 77 97 112*  BUN  --  43* 41* 42* 43* 42*  CREATININE  --  1.53* 1.49* 1.55* 1.84* 1.74*  CALCIUM  --  7.8* 7.9* 8.3* 8.4* 8.3*  MG 1.4* 1.9 1.8 1.9 1.8 1.9  PHOS 3.6  --   --   --   --   --    Liver Function Tests: Recent Labs  Lab 05/22/20 0244  AST 12*  ALT 8  ALKPHOS 64  BILITOT 0.6  PROT 4.7*  ALBUMIN 2.2*   CBC: Recent Labs  Lab 05/22/20 0052 05/22/20 0244 05/22/20 1552 05/23/20 0452 05/24/20 0555 05/25/20 0600  WBC 10.0 10.0  --  6.9 15.2* 13.3*  HGB 8.3* 7.3* 8.8* 8.7* 8.5* 8.8*  HCT 27.2* 24.3* 28.4* 28.3* 27.6* 27.9*  MCV 98.6 100.4*  --  95.6 95.8 94.9  PLT 221 200  --  191 205 190   BNP (last 3 results) Recent Labs    04/23/20 0530 05/18/20 1110 05/22/20 0052  BNP 370.5* 748.3* 939.2*    CBG: Recent Labs  Lab 05/18/20 2154 05/21/20 2354 05/23/20 0751  GLUCAP 125* 115* 78    Recent Results (from the past 240 hour(s))  Blood Culture (routine x 2)     Status: None   Collection Time: 05/18/20  9:00 AM   Specimen: BLOOD  Result Value Ref Range Status   Specimen Description BLOOD LEFT ANTECUBITAL  Final   Special Requests   Final    BOTTLES DRAWN AEROBIC AND ANAEROBIC Blood Culture results may not be optimal due to an inadequate volume of blood received in culture bottles   Culture   Final    NO GROWTH 5 DAYS Performed at [facility], [address] [place], [place] [address]    Report Status 05/23/2020 FINAL  Final  Blood Culture (routine x 2)     Status: None   Collection Time: 05/18/20  9:00 AM   Specimen: BLOOD  Result Value Ref Range Status   Specimen Description BLOOD  BLOOD LEFT HAND  Final   Special Requests   Final    BOTTLES DRAWN AEROBIC AND ANAEROBIC Blood Culture results may not be optimal due to an inadequate volume of blood received in culture bottles   Culture   Final    NO GROWTH 5 DAYS Performed at [facility], [address] [place], [place] [address]    Report Status 05/23/2020 FINAL  Final  Resp Panel by RT-PCR (Flu A&B, Covid) Nasopharyngeal Swab     Status: Abnormal   Collection Time: 05/18/20 10:04 AM   Specimen: Nasopharyngeal Swab; Nasopharyngeal(NP) swabs in vial transport medium  Result Value Ref Range Status   SARS Coronavirus 2 by RT PCR POSITIVE (A) NEGATIVE Corrected    Comment: RESULT CALLED TO, READ BACK BY AND VERIFIED WITH:  TAYLOR LEWIS AT 1115 05/18/20 SDR (NOTE) SARS-CoV-2 target nucleic acids are DETECTED.  The SARS-CoV-2 RNA is generally detectable in upper respiratory specimens during the acute phase of infection. Positive results are indicative of the presence of the identified virus, but do not rule out bacterial infection or co-infection with other pathogens not detected by the test. Clinical correlation with patient history and other diagnostic information is necessary to determine patient infection status. The expected result is Negative.  Fact Sheet for Patients: EntrepreneurPulse.com.au  Fact Sheet for Healthcare Providers: IncredibleEmployment.be  This test is not yet approved or cleared by the [place] FDA and  has been authorized for detection and/or diagnosis of SARS-CoV-2 by FDA under an Emergency Use Authorization (EUA).  This EUA will remain in effect (meaning this test can be  used) for the duration of  the COVID-19 declaration under Section 564(b)(1) of the Act, 21 U.S.C. section 360bbb-3(b)(1), unless the authorization is terminated or revoked sooner.  CORRECTED ON 03/26 AT 6314: PREVIOUSLY REPORTED AS POSITIVE RESULT CALLED TO, READ BACK  BY AND VERIFIED WITH:  Lovena Le LEWIS AT 9702 04/20/20 SDR    Influenza A by PCR NEGATIVE NEGATIVE Final   Influenza B by PCR NEGATIVE NEGATIVE Final    Comment: (NOTE) The Xpert Xpress SARS-CoV-2/FLU/RSV plus assay is intended as an aid in the diagnosis of influenza from Nasopharyngeal swab specimens and should not be used as a sole basis for treatment. Nasal washings and aspirates are unacceptable for Xpert Xpress SARS-CoV-2/FLU/RSV testing.  Fact Sheet for Patients: EntrepreneurPulse.com.au  Fact  Sheet for Healthcare Providers: IncredibleEmployment.be  This test is not yet approved or cleared by the [place] and has been authorized for detection and/or diagnosis of SARS-CoV-2 by FDA under an Emergency Use Authorization (EUA). This EUA will remain in effect (meaning this test can be used) for the duration of the COVID-19 declaration under Section 564(b)(1) of the Act, 21 U.S.C. section 360bbb-3(b)(1), unless the authorization is terminated or revoked.  Performed at [facility], [address] [place], [place] [address]   Urine culture     Status: None   Collection Time: 05/18/20 11:45 AM   Specimen: Urine, Random  Result Value Ref Range Status   Specimen Description   Final    URINE, RANDOM Performed at [facility], [address], [place], [place] [address]    Special Requests   Final    NONE Performed at [facility], [address] [place], [place] [address]    Culture   Final    NO GROWTH Performed at [facility], [place] [address] [place], [place] [address]    Report Status 05/19/2020 FINAL  Final  MRSA PCR Screening     Status: None   Collection Time: 05/18/20  3:48 PM   Specimen: Nasal Mucosa; Nasopharyngeal  Result Value Ref Range Status   MRSA by PCR NEGATIVE NEGATIVE Final    Comment:        The GeneXpert MRSA Assay (FDA approved for NASAL specimens only), is one component  of a comprehensive MRSA colonization surveillance program. It is not intended to diagnose MRSA infection nor to guide or monitor treatment for MRSA infections. Performed at [facility], [address] [place], [place] [address]   MRSA PCR Screening     Status: None   Collection Time: 05/23/20  8:16 AM   Specimen: Nasal Mucosa; Nasopharyngeal  Result Value Ref Range Status   MRSA by PCR NEGATIVE NEGATIVE Final    Comment:        The GeneXpert MRSA Assay (FDA approved for NASAL specimens only), is one component of a comprehensive MRSA colonization surveillance program. It is not intended to diagnose MRSA infection nor to guide or monitor treatment for MRSA infections. Performed at [name], [address] [place], [place] [address]   Body fluid culture w Gram Stain     Status: None (Preliminary result)   Collection Time: 05/24/20 11:58 AM   Specimen: PATH Cytology Pleural fluid  Result Value Ref Range Status   Specimen Description   Final    PLEURAL Performed at [facility], [address], [place], [place] [address]    Special Requests   Final    NONE Performed at [facility], [address] [place], [place] [address]    Gram Stain   Final    FEW WBC PRESENT, PREDOMINANTLY MONONUCLEAR NO ORGANISMS SEEN    Culture   Final    NO GROWTH < 24 HOURS Performed at [facility], [place] [address] [place], [place] [address]    Report Status PENDING  Incomplete     Studies: CT HEAD WO CONTRAST  Result Date: 05/24/2020 CLINICAL DATA:  Mental status change EXAM: CT HEAD WITHOUT CONTRAST TECHNIQUE: Contiguous axial images were obtained from the base of the skull through the vertex without intravenous contrast. COMPARISON:  05/22/2020 FINDINGS: Brain: There is no acute intracranial hemorrhage, mass effect, or edema. No new loss of gray-white differentiation. Small chronic left cerebellar infarct.  Patchy hypoattenuation in the  supratentorial white matter likely reflects stable chronic microvascular ischemic changes. There is no extra-axial fluid collection. Ventricles and sulci are stable in size and configuration. Vascular: There is atherosclerotic calcification at the skull base. Skull: Calvarium is unremarkable. Sinuses/Orbits: Mild mucosal thickening. No acute orbital abnormality. Other: None. IMPRESSION: No acute intracranial abnormality. No significant change since recent prior study. Electronically Signed   By: Macy Mis M.D.   On: 05/24/2020 17:14   DG Chest Port 1 View  Result Date: 05/24/2020 CLINICAL DATA:  Post left-sided thoracentesis. EXAM: PORTABLE CHEST 1 VIEW COMPARISON:  Chest radiograph-05/22/2020; 05/18/2020; chest CT-05/18/2020 FINDINGS: Grossly unchanged cardiac silhouette and mediastinal contours with atherosclerotic plaque within thoracic aorta. Dystrophic calcifications within the mitral valve annulus. Interval reduction/near resolution of left-sided pleural effusion post thoracentesis with improved aeration of the left lung apex. Redemonstrated extensive left mid and lower lung heterogeneous/consolidative opacities with associated left-sided volume loss and deviation of the cardiomediastinal structures to the left. Pulmonary vasculature within the right lung remains indistinct. No definite right-sided pleural effusion. No acute osseous abnormalities. Redemonstrated chronic displaced fracture involving the anatomic neck of the right humerus, incompletely evaluated. IMPRESSION: 1. Interval reduction/near resolution of left-sided pleural effusion post thoracentesis. No pneumothorax. 2. Otherwise, similar findings of extensive left mid and lower lung heterogeneous/consolidative opacities with associated volume loss. Electronically Signed   By: Sandi Mariscal M.D.   On: 05/24/2020 12:15   US THORACENTESIS ASP PLEURAL SPACE W/IMG GUIDE  Result Date: 05/24/2020 INDICATION: Symptomatic left sided pleural  effusion. Please perform ultrasound-guided thoracentesis for diagnostic and therapeutic purposes. EXAM: US THORACENTESIS ASP PLEURAL SPACE W/IMG GUIDE COMPARISON:  Chest radiograph-05/22/2020; 05/18/2020; chest CT-05/18/2020 MEDICATIONS: None. COMPLICATIONS: None immediate. TECHNIQUE: Informed written consent was obtained from the the patient and the patient's daughter after a discussion of the risks, benefits and alternatives to treatment. A timeout was performed prior to the initiation of the procedure. Initial ultrasound scanning demonstrates a small left-sided pleural effusion with dominant component loculated regional to the left upper lobe. The posterior aspect of the left upper/mid chest was prepped and draped in the usual sterile fashion. 1% lidocaine was used for local anesthesia. Under direct ultrasound guidance, an 8 Fr Safe-T-Centesis catheter was introduced. The thoracentesis was performed. The catheter was removed and a dressing was applied. The patient tolerated the procedure well without immediate post procedural complication. The patient was escorted to have an upright chest radiograph. FINDINGS: A total of approximately 250 cc of serous fluid was removed. Requested samples were sent to the laboratory. IMPRESSION: Successful ultrasound-guided left sided thoracentesis yielding 250 cc of pleural fluid. Electronically Signed   By: Sandi Mariscal M.D.   On: 05/24/2020 12:20    Scheduled Meds: . atorvastatin  20 mg Oral Daily  . bisacodyl  10 mg Rectal QHS  . Chlorhexidine Gluconate Cloth  6 each Topical Q0600  . clopidogrel  75 mg Oral Daily  . dextromethorphan-guaiFENesin  1 tablet Oral BID  . enoxaparin (LOVENOX) injection  30 mg Subcutaneous Q24H  . ipratropium-albuterol  3 mL Nebulization BID  . iron polysaccharides  150 mg Oral Daily  . levETIRAcetam  250 mg Oral BID  . levothyroxine  100 mcg Oral Q0600  . mometasone-formoterol  2 puff Inhalation BID  . montelukast  10 mg Oral QHS  .  mupirocin ointment  1 application Nasal BID  . pantoprazole  20 mg Oral Daily  . sodium chloride flush  10-40 mL Intracatheter Q12H  . sodium phosphate  1 enema Rectal Daily   Continuous Infusions: . sodium chloride 30 mL/hr at 05/24/20 1223  . piperacillin-tazobactam (ZOSYN)  IV 3.375 g (05/25/20 0900)    Assessment/Plan:  1. Severe sepsis, present on admission with multifocal pneumonia.  Patient had a left pleural effusion.  Had left-sided thoracentesis on 05/24/2020 which removed 250 mL of fluid.  So far cultures negative.  White blood cell count trending better.  Patient did have white blood cell count in the fluid that was removed.  Continue Zosyn for now. 2. Acute metabolic encephalopathy.  This has improved from yesterday.  Continue to monitor closely.  Repeat CT scan of the head negative.  Holding Lyrica for now.  Careful with tramadol. 3. NSTEMI.  Medical management as per cardiology.  Patient on Plavix and atorvastatin. 4. Acute hypoxic respiratory failure secondary to sepsis and pneumonia.  Patient on minimal oxygen support.  Yesterday had a pulse ox of 85% on room air.  Hopefully should be able to taper off oxygen soon. 5. Chronic right shoulder pain on tramadol as needed 6. Acute kidney injury on chronic kidney disease stage IIIb.  Patient's creatinine on 05/22/2020 was 1.49 and it went up to 1.84 yesterday.  Today better at 1.74.  Continue gentle IV fluids 7. Anemia of chronic disease.  Patient on iron supplementation 8. Paroxysmal atrial fibrillation not on major anticoagulation with history of GI bleed and fall risk 9. Essential hypertension holding amlodipine 10. History of seizures on Keppra 11. Weakness.  Physical therapy reevaluation since mental status little better today        Code Status:     Code Status Orders  (From admission, onward)         Start     Ordered   05/18/20 1439  Do not attempt resuscitation (DNR)  Continuous       Question Answer Comment   In the event of cardiac or respiratory ARREST Do not call a "code blue"   In the event of cardiac or respiratory ARREST Do not perform Intubation, CPR, defibrillation or ACLS   In the event of cardiac or respiratory ARREST Use medication by any route, position, wound care, and other measures to relive pain and suffering. May use oxygen, suction and manual treatment of airway obstruction as needed for comfort.      05/18/20 1439        Code Status History    Date Active Date Inactive Code Status Order ID Comments User Context   04/23/2020 1851 04/24/2020 2335 DNR [id number]  Ivor Costa, MD Inpatient   03/06/2020 1332 03/08/2020 2259 DNR [id number]  Gwynne Edinger, MD ED   02/02/2020 2244 02/14/2020 1927 DNR [id number]  Rhetta Mura, DO ED   02/02/2020 1745 02/02/2020 2244 DNR [id number]  Rhetta Mura, DO ED   08/18/2019 1932 08/23/2019 2056 DNR [id number]  Athena Masse, MD ED   08/12/2019 1610 08/15/2019 1935 DNR [id number]  Ivor Costa, MD ED   08/12/2019 1447 08/12/2019 1610 DNR [id number]  Merlyn Lot, MD ED   08/06/2019 1455 08/07/2019 1551 DNR [id number]  Max Sane, MD ED   05/16/2017 0217 05/20/2017 1758 DNR [id number]  Lance Coon, MD Inpatient   09/21/2016 1245 09/26/2016 1836 DNR [id number]  [name], MD ED   10/01/2015 1144 10/02/2015 0748 Full Code [id number]  Bettey Costa, MD Inpatient   Advance Care Planning Activity     Family Communication: Spoke with daughter at the bedside Disposition Plan: Status is: Inpatient  Dispo: The  patient is from: Long-term care              Anticipated d/c is to: Long-term care              Patient currently doing a little bit better today than yesterday.   Difficult to place patient.  No.  Antibiotics:  Zosyn  Time spent: 28 minutes  [employer]

## 2020-05-25 NOTE — Progress Notes (Signed)
Progress Note  Patient Name: Amy [name] Date of Encounter: 05/25/2020  Primary Cardiologist: Rockey Situ  Subjective   Much more alert on rounds today. No appetite. S/p thoracentesis with 250 cc fluid removed. No chest pain.   Inpatient Medications    Scheduled Meds: . atorvastatin  20 mg Oral Daily  . bisacodyl  10 mg Rectal QHS  . Chlorhexidine Gluconate Cloth  6 each Topical Q0600  . clopidogrel  75 mg Oral Daily  . dextromethorphan-guaiFENesin  1 tablet Oral BID  . enoxaparin (LOVENOX) injection  30 mg Subcutaneous Q24H  . ipratropium-albuterol  3 mL Nebulization BID  . iron polysaccharides  150 mg Oral Daily  . levETIRAcetam  250 mg Oral BID  . levothyroxine  100 mcg Oral Q0600  . mometasone-formoterol  2 puff Inhalation BID  . montelukast  10 mg Oral QHS  . mupirocin ointment  1 application Nasal BID  . pantoprazole  20 mg Oral Daily  . sodium chloride flush  10-40 mL Intracatheter Q12H  . sodium phosphate  1 enema Rectal Daily   Continuous Infusions: . sodium chloride 30 mL/hr at 05/24/20 1223  . piperacillin-tazobactam (ZOSYN)  IV 3.375 g (05/25/20 0900)   PRN Meds: acetaminophen, albuterol, docusate sodium, melatonin, nitroGLYCERIN, ondansetron (ZOFRAN) IV, polyethylene glycol, sodium chloride flush, traMADol   Vital Signs    Vitals:   05/25/20 0449 05/25/20 0453 05/25/20 0840 05/25/20 1114  BP:  (!) 119/44 (!) 138/51 124/74  Pulse:  65 72 70  Resp:  16 18 18   Temp:  98.4 F (36.9 C)  98.4 F (36.9 C)  TempSrc:  Oral    SpO2:  98% 97% 95%  Weight: 81.5 kg     Height:        Intake/Output Summary (Last 24 hours) at 05/25/2020 1319 Last data filed at 05/25/2020 0500 Gross per 24 hour  Intake 1131.13 ml  Output --  Net 1131.13 ml   Filed Weights   05/22/20 0504 05/24/20 0500 05/25/20 0449  Weight: 82.5 kg 82.6 kg 81.5 kg    Telemetry    SR with rates in the 70s to 80s bpm with 1st degree AV block - Personally Reviewed  ECG    No new  tracings - Personally Reviewed  Physical Exam   GEN: No acute distress. Elderly and frail appearing.   Neck: No JVD. Cardiac: RRR, I/VI systolic murmur, no rubs, or gallops.  Respiratory: Diminished breath sounds, particularly at the left base. Poor inspiratory effort.  GI: Soft, nontender, non-distended.   MS: No edema; No deformity. Neuro:  Alert.  Psych: Quietly responds to questions appropriately.  Labs    Chemistry Recent Labs  Lab 05/22/20 0244 05/23/20 0452 05/24/20 0555 05/25/20 0731  NA 139 141 143 141  K 4.7 4.6 4.4 4.5  CL 110 111 111 112*  CO2 23 24 22 22   GLUCOSE 109* 77 97 112*  BUN 41* 42* 43* 42*  CREATININE 1.49* 1.55* 1.84* 1.74*  CALCIUM 7.9* 8.3* 8.4* 8.3*  PROT 4.7*  --   --   --   ALBUMIN 2.2*  --   --   --   AST 12*  --   --   --   ALT 8  --   --   --   ALKPHOS 64  --   --   --   BILITOT 0.6  --   --   --   GFRNONAA 33* 31* 26* 27*  ANIONGAP 6 6 10  7     Hematology Recent Labs  Lab 05/23/20 0452 05/24/20 0555 05/25/20 0600  WBC 6.9 15.2* 13.3*  RBC 2.96* 2.88* 2.94*  HGB 8.7* 8.5* 8.8*  HCT 28.3* 27.6* 27.9*  MCV 95.6 95.8 94.9  MCH 29.4 29.5 29.9  MCHC 30.7 30.8 31.5  RDW 15.9* 15.0 15.7*  PLT 191 205 190    Cardiac EnzymesNo results for input(s): TROPONINI in the last 168 hours. No results for input(s): TROPIPOC in the last 168 hours.   BNP Recent Labs  Lab 05/22/20 0052  BNP 939.2*     DDimer  Recent Labs  Lab 05/22/20 0052  DDIMER 1.99*     Radiology    CT head 05/24/2020: IMPRESSION: No acute intracranial abnormality. No significant change since recent prior study. __________  CXR 05/22/2020: IMPRESSION: Large left pleural effusion with worsening left upper consolidation. __________  CT head 05/22/2020: IMPRESSION: 1. Chronic ischemic microangiopathy without acute intracranial abnormality. 2. ASPECTS is 10. __________  CT chest 05/18/2020: IMPRESSION: 1. Extensive left-sided pneumonia opacifying  most of the left lung. Small left parapneumonic effusion. 2. No acute intra-abdominal finding. 3. Numerous chronic/known incidental findings which are described Above. __________  CXR 05/18/2020: IMPRESSION: Widespread airspace opacity throughout much of the left lung consistent with multifocal pneumonia. Right lung clear. There is cardiomegaly with a degree of pulmonary vascular congestion. There is calcification in each carotid artery. Aortic Atherosclerosis (ICD10-I70.0).  Cardiac Studies   Limited echo 05/23/2020: 1. Left ventricular ejection fraction, by estimation, is 55 to 60%. The  left ventricle has normal function. Left ventricular endocardial border  not optimally defined to evaluate regional wall motion. Left ventricular  diastolic function could not be  evaluated.  2. Right ventricular systolic function is normal. The right ventricular  size is normal. Tricuspid regurgitation signal is inadequate for assessing  PA pressure.  3. Left atrial size was mildly dilated.  4. Moderate pleural effusion.  5. The mitral valve is abnormal. No evidence of mitral valve  regurgitation. No evidence of mitral stenosis. Moderate mitral annular  calcification.  6. The aortic valve is normal in structure. Aortic valve regurgitation is  not visualized. Mild aortic valve stenosis. No gradients were measured.  7. limited and challenging study. __________  Carlton Adam MPI 03/2017: Lexiscan MPI 04/23/2017: Pharmacological myocardial perfusion imaging study with no significant Ischemia Small region of fixed apical thinning, consistent with attenuation artifact GI uptake artifact noted Normal wall motion, EF estimated at 56% No EKG changes concerning for ischemia at peak stress or in recovery. Low risk scan  Patient Profile     87 y.o. female with history of CAD s/p LAD stenting to 2011, HFpEF, PAF not on [place] secondary to GI bleed and frequent falls carotid artery disease, DM, CKD  stage IV, HTN with RAS s/p renal artery stenting in 01/2019, Covid in 02/2020, HLD, COPD, anemia of chronic disease, morbid obesity, beta blocker-induced bradycardia, and shingles, who was admitted with PNA and we are seeing for elevated HS-Tn.   Assessment & Plan    1. NSTEMI -Patient with known CAD with prior LAD stenting in 2011 -Mildly elevated high sensitivity troponin peaking at 1196 felt to be related to supply demand ischemia in the setting of sepsis related to PNA, COPD, hypotension, CKD stage IV, and anemia of chronic disease -Echo this admission demonstrated a preserved LVSF  -Status post treatment with IV heparin, which was discontinued after 48 hours of therapy  -Plavix without aspirin given prior GI bleed and frequent falls  -  Not on beta blocker given prior beta blocker-induced bradycardia  -Given age and comorbid conditions, interventional cardiology does not recommend ischemic testing  2. PAF: -Not on [place] given history of GI bleed and frequent falls -Currently in sinus rhythm with rates in the 70s to 80s bpm -Not on a beta blocker as above  3. Left pleural effusion/HFpEF: -Given IV fluids upon initial presentation in the setting of sepsis with PNA and hypotension, which is possibly contributing to her effusion, vs in the setting of her underlying PNA vs third spacing in the settng hypoalbuminemia -Status post thoracentesis 3/31 with 250 cc fluid removed   4. HTN: -Blood pressure is well controlled on amlodipine   5. HLD: -LDL 36 -Continue Lipitor   6. Sepsis with PNA/encephalopathy: -Mental status improved, much more alert with encephalopathy likely in the setting of her acute illness -Appears frail -CT head 3/31 without acute intracranial process -Per IM  7. CKD stage IV: -Renal function is overall relatively stable  8. Anemia of chronic disease: -Low, though stable  9. Acute hypoxic respiratory failure: -Has required BiPAP, currently on supplemental oxygen  via nasal cannula at 2 L -Improving  -Likely in the setting of underlying PNA, HFpEF, and pleural effusion -Status post thoracentesis as above  10. Hypoalbuminemia: -Poor appetite with third spacing is contributing to her overall presentation  -Supplement with Boost/Ensure   For questions or updates, please contact [facility] Please consult [email or web address] for contact info under Cardiology/STEMI.    Signed, Christell Faith, PA-C [place] Pager: [phone] 05/25/2020, 1:19 PM

## 2020-05-25 NOTE — Progress Notes (Signed)
States breathing fine off mask. Refused bipap

## 2020-05-25 NOTE — Progress Notes (Signed)
OT Cancellation Note  Patient Details Name: Amy [name] MRN: [id number] DOB: Dec 26, 1927   Cancelled Treatment:    Reason Eval/Treat Not Completed: Fatigue/lethargy limiting ability to participate. Upon arrival to room, pt asleep in bed. Pt awoken easily following name called. OT attempted to encourage pt to participate in OT session, however pt politely deferring due to fatigue following PT session. OT to re-attempt at later time/date as able.  Fredirick Maudlin, OTR/L [facility]

## 2020-05-25 NOTE — Plan of Care (Signed)
Pt is AAOx4. Pt's VSS. Pt denies any pain. Foley intact and CHG and peri care was done. Labs was draw and sent. All needs attended. Call light is within reach. Safety measures maintained. Will continue to monitor.   Problem: Education: Goal: Knowledge of General Education information will improve Description: Including pain rating scale, medication(s)/side effects and non-pharmacologic comfort measures Outcome: Progressing   Problem: Health Behavior/Discharge Planning: Goal: Ability to manage health-related needs will improve Outcome: Progressing   Problem: Clinical Measurements: Goal: Ability to maintain clinical measurements within normal limits will improve Outcome: Progressing Goal: Will remain free from infection Outcome: Progressing Goal: Diagnostic test results will improve Outcome: Progressing Goal: Respiratory complications will improve Outcome: Progressing Goal: Cardiovascular complication will be avoided Outcome: Progressing   Problem: Activity: Goal: Risk for activity intolerance will decrease Outcome: Progressing   Problem: Nutrition: Goal: Adequate nutrition will be maintained Outcome: Progressing   Problem: Coping: Goal: Level of anxiety will decrease Outcome: Progressing   Problem: Elimination: Goal: Will not experience complications related to bowel motility Outcome: Progressing Goal: Will not experience complications related to urinary retention Outcome: Progressing   Problem: Pain Managment: Goal: General experience of comfort will improve Outcome: Progressing   Problem: Safety: Goal: Ability to remain free from injury will improve Outcome: Progressing   Problem: Skin Integrity: Goal: Risk for impaired skin integrity will decrease Outcome: Progressing

## 2020-05-25 NOTE — Progress Notes (Signed)
Physical Therapy Treatment Patient Details Name: Amy [name] MRN: [id number] DOB: Aug 24, 1927 Today's Date: 05/25/2020    History of Present Illness Pt is a 87 yo female that presented to hspital due to abdominal pain, SOB, fever. Workup showed PNA, sepsis, elevated troponin (per cardiology demand ischemia); pt placed on heparin drip this admission. PMH: CAD, Cancer, dCHF c LVEF 60-65%, hypoTSH, stg-IV CKD, anemia of chronic disease, asthma, GERD, bilat knee OA, covid in January of 2022, has tested positive in Feb and March, R humeral head fx with sling.    PT Comments    Pt was long sitting in bed awake with R UE sling in place upon arriving. She is conversational and very eager/agreeable to get OOB. Pt was on 2 L o2 upon arriving. Attempted to wean O2 to rm air however pt desaturates to 86% quickly. Reapplied 2 L and pt was able to  recover to >90 % throughout remainder of session. Pt has not been seen my PT since 05/21/20. She required a lot more assistance today than then. She required mod-max of one to achieve EOB short sit. Sat EOB with CGA only for balance however quickly required more assistance due to inability to stay alert/awake. Pt unable to keep eyes open and needed max assist to return and reposition in bed. Pt was asleep once repositioned. HR 65bpm with sao2 94% on 2 L at conclusion of session. Pt will need extensive PT going forward to reduce caregiver burden and maximize independence with ADL.    Follow Up Recommendations  SNF     Equipment Recommendations  None recommended by PT       Precautions / Restrictions Precautions Precautions: Fall;Shoulder Shoulder Interventions: Shoulder sling/immobilizer Precaution Booklet Issued: No Restrictions Weight Bearing Restrictions: Yes RUE Weight Bearing: Non weight bearing    Mobility  Bed Mobility Overal bed mobility: Needs Assistance Bed Mobility: Supine to Sit     Supine to sit: Max assist;HOB elevated;Mod assist      General bed mobility comments: pt is very weak but puts in graet effort to achieve EOB short sit with as little assist as possible. Pt does require max assist to fully achieve upright EOB sitting. Once sitting EOB required CGA for maintaining balance with LUE support and BLEs on floor. Pt unable to stay awake at EOB. Did attempt to wean 2 L o2 however pt quickly desaturates < 88%. reapplied 2 L o2 and sao2 recovers to >90%    Transfers      General transfer comment: unable to safely progress to transfer. Pt unable to stay awake while seated EOB. Becomes lethargic and required total assist to return and reposition back to bed.      Balance Overall balance assessment: Needs assistance Sitting-balance support: Feet supported Sitting balance-Leahy Scale: Fair Sitting balance - Comments: at first required CGA however progressively require more assistance due to lethargy/falling asleep in sitting. HR in sitting between 65-75 BPM       Cognition Arousal/Alertness: Awake/alert Behavior During Therapy: WFL for tasks assessed/performed Overall Cognitive Status: Within Functional Limits for tasks assessed      General Comments: Pt is A and oriented x 2. Agrees to PT session and is motivated for OOB activity. resting HR 72bpm with sao2 96% on 2 L o2.             Pertinent Vitals/Pain Pain Assessment: No/denies pain Pain Score: 0-No pain           PT Goals (current goals can now  be found in the care plan section) Acute Rehab PT Goals Patient Stated Goal: to go home Progress towards PT goals: Not progressing toward goals - comment (pt very limted by lethargy/activity tolerance limitation)    Frequency    Min 2X/week      PT Plan Current plan remains appropriate    Co-evaluation     PT goals addressed during session: Mobility/safety with mobility;Balance;Proper use of DME;Strengthening/ROM        AM-PAC PT "6 Clicks" Mobility   Outcome Measure  Help needed turning  from your back to your side while in a flat bed without using bedrails?: A Lot Help needed moving from lying on your back to sitting on the side of a flat bed without using bedrails?: A Lot Help needed moving to and from a bed to a chair (including a wheelchair)?: A Lot Help needed standing up from a chair using your arms (e.g., wheelchair or bedside chair)?: Total Help needed to walk in hospital room?: Total Help needed climbing 3-5 steps with a railing? : Total 6 Click Score: 9    End of Session Equipment Utilized During Treatment: Oxygen;Other (comment) (RUE sling) Activity Tolerance: Patient limited by fatigue;Patient limited by lethargy Patient left: in bed;with call bell/phone within reach;with bed alarm set Nurse Communication: Mobility status PT Visit Diagnosis: Muscle weakness (generalized) (M62.81);Other abnormalities of gait and mobility (R26.89)     Time: 0383-3383 PT Time Calculation (min) (ACUTE ONLY): 8 min  Charges:  $Therapeutic Activity: 8-22 mins                     Julaine Fusi PTA 05/25/20, 3:16 PM

## 2020-05-26 DIAGNOSIS — R52 Pain, unspecified: Secondary | ICD-10-CM | POA: Diagnosis not present

## 2020-05-26 DIAGNOSIS — J189 Pneumonia, unspecified organism: Secondary | ICD-10-CM | POA: Diagnosis not present

## 2020-05-26 DIAGNOSIS — A419 Sepsis, unspecified organism: Secondary | ICD-10-CM | POA: Diagnosis not present

## 2020-05-26 DIAGNOSIS — J90 Pleural effusion, not elsewhere classified: Secondary | ICD-10-CM | POA: Diagnosis not present

## 2020-05-26 LAB — URIC ACID: Uric Acid, Serum: 6.9 mg/dL (ref 2.5–7.1)

## 2020-05-26 LAB — CBC
HCT: 28.8 % — ABNORMAL LOW (ref 36.0–46.0)
Hemoglobin: 9 g/dL — ABNORMAL LOW (ref 12.0–15.0)
MCH: 29.7 pg (ref 26.0–34.0)
MCHC: 31.3 g/dL (ref 30.0–36.0)
MCV: 95 fL (ref 80.0–100.0)
Platelets: 219 10*3/uL (ref 150–400)
RBC: 3.03 MIL/uL — ABNORMAL LOW (ref 3.87–5.11)
RDW: 15 % (ref 11.5–15.5)
WBC: 13.9 10*3/uL — ABNORMAL HIGH (ref 4.0–10.5)
nRBC: 0 % (ref 0.0–0.2)

## 2020-05-26 LAB — BASIC METABOLIC PANEL
Anion gap: 4 — ABNORMAL LOW (ref 5–15)
BUN: 41 mg/dL — ABNORMAL HIGH (ref 8–23)
CO2: 26 mmol/L (ref 22–32)
Calcium: 8.2 mg/dL — ABNORMAL LOW (ref 8.9–10.3)
Chloride: 113 mmol/L — ABNORMAL HIGH (ref 98–111)
Creatinine, Ser: 1.49 mg/dL — ABNORMAL HIGH (ref 0.44–1.00)
GFR, Estimated: 33 mL/min — ABNORMAL LOW (ref >60)
Glucose, Bld: 126 mg/dL — ABNORMAL HIGH (ref 70–99)
Potassium: 4.2 mmol/L (ref 3.5–5.1)
Sodium: 143 mmol/L (ref 135–145)

## 2020-05-26 LAB — MAGNESIUM: Magnesium: 1.8 mg/dL (ref 1.7–2.4)

## 2020-05-26 MED ORDER — PREDNISONE 20 MG PO TABS
20.0000 mg | ORAL_TABLET | Freq: Every day | ORAL | Status: DC
  Administered 2020-05-26 – 2020-05-29 (×4): 20 mg via ORAL
  Filled 2020-05-26 (×4): qty 1

## 2020-05-26 MED ORDER — COLCHICINE 0.6 MG PO TABS
0.3000 mg | ORAL_TABLET | Freq: Once | ORAL | Status: AC
  Administered 2020-05-26: 0.3 mg via ORAL
  Filled 2020-05-26 (×4): qty 1

## 2020-05-26 MED ORDER — MAGNESIUM OXIDE 400 (241.3 MG) MG PO TABS
400.0000 mg | ORAL_TABLET | Freq: Every day | ORAL | Status: DC
  Administered 2020-05-26 – 2020-06-02 (×8): 400 mg via ORAL
  Filled 2020-05-26 (×8): qty 1

## 2020-05-26 MED ORDER — PREGABALIN 25 MG PO CAPS
25.0000 mg | ORAL_CAPSULE | Freq: Two times a day (BID) | ORAL | Status: DC
  Administered 2020-05-26: 25 mg via ORAL
  Filled 2020-05-26: qty 1

## 2020-05-26 MED ORDER — PREGABALIN 50 MG PO CAPS
50.0000 mg | ORAL_CAPSULE | Freq: Two times a day (BID) | ORAL | Status: DC
  Administered 2020-05-26 – 2020-05-30 (×8): 50 mg via ORAL
  Filled 2020-05-26 (×8): qty 1

## 2020-05-26 NOTE — Progress Notes (Signed)
Patient ID: Amy [name], female   DOB: 1927/10/20, 87 y.o.   MRN: [id number] [facility] PROGRESS NOTE  Jaquala Fuller Ranker YQI:[id number] DOB: Sep 03, 1927 DOA: 05/18/2020 PCP: Alvester Morin, MD  HPI/Subjective: Patient states her feet are very sore.  She was sitting up eating breakfast and ate most of her breakfast.  Answering questions appropriately.  Some cough.  Still feeling weak.  Patient's daughter states that she was a little confused today.  Objective: Vitals:   05/26/20 0746 05/26/20 1129  BP: (!) 153/51 (!) 141/53  Pulse: 69 67  Resp: 18 18  Temp: 97.9 F (36.6 C) 97.8 F (36.6 C)  SpO2: 96% 98%    Intake/Output Summary (Last 24 hours) at 05/26/2020 1257 Last data filed at 05/26/2020 0900 Gross per 24 hour  Intake 944.04 ml  Output 626 ml  Net 318.04 ml   Filed Weights   05/24/20 0500 05/25/20 0449 05/26/20 0625  Weight: 82.6 kg 81.5 kg 82.8 kg    ROS: Review of Systems  Respiratory: Positive for cough and shortness of breath.   Cardiovascular: Negative for chest pain.  Gastrointestinal: Negative for abdominal pain.  Musculoskeletal: Positive for joint pain.   Exam: Physical Exam HENT:     Head: Normocephalic.     Mouth/Throat:     Pharynx: No oropharyngeal exudate.  Eyes:     General: Lids are normal.     Conjunctiva/sclera: Conjunctivae normal.  Cardiovascular:     Rate and Rhythm: Normal rate and regular rhythm.     Heart sounds: Normal heart sounds, S1 normal and S2 normal.  Pulmonary:     Breath sounds: Examination of the right-lower field reveals decreased breath sounds and rhonchi. Examination of the left-lower field reveals decreased breath sounds and rhonchi. Decreased breath sounds and rhonchi present. No wheezing or rales.  Abdominal:     Palpations: Abdomen is soft.     Tenderness: There is no abdominal tenderness.  Musculoskeletal:     Right foot: Swelling present.     Left foot: Swelling present.     Comments: Patient's  bilateral feet and ankles and leg very sore to touch.  Skin:    General: Skin is warm.     Findings: No rash.  Neurological:     Mental Status: She is alert.     Comments: Answers questions appropriately.       Data Reviewed: Basic Metabolic Panel: Recent Labs  Lab 05/20/20 0457 05/21/20 0318 05/22/20 0244 05/23/20 0452 05/24/20 0555 05/25/20 0731 05/26/20 0432  NA  --    < > 139 141 143 141 143  K  --    < > 4.7 4.6 4.4 4.5 4.2  CL  --    < > 110 111 111 112* 113*  CO2  --    < > 23 24 22 22 26   GLUCOSE  --    < > 109* 77 97 112* 126*  BUN  --    < > 41* 42* 43* 42* 41*  CREATININE  --    < > 1.49* 1.55* 1.84* 1.74* 1.49*  CALCIUM  --    < > 7.9* 8.3* 8.4* 8.3* 8.2*  MG 1.4*   < > 1.8 1.9 1.8 1.9 1.8  PHOS 3.6  --   --   --   --   --   --    < > = values in this interval not displayed.   Liver Function Tests: Recent Labs  Lab 05/22/20 0244  AST  12*  ALT 8  ALKPHOS 64  BILITOT 0.6  PROT 4.7*  ALBUMIN 2.2*   CBC: Recent Labs  Lab 05/22/20 0244 05/22/20 1552 05/23/20 0452 05/24/20 0555 05/25/20 0600 05/26/20 0432  WBC 10.0  --  6.9 15.2* 13.3* 13.9*  HGB 7.3* 8.8* 8.7* 8.5* 8.8* 9.0*  HCT 24.3* 28.4* 28.3* 27.6* 27.9* 28.8*  MCV 100.4*  --  95.6 95.8 94.9 95.0  PLT 200  --  191 205 190 219   BNP (last 3 results) Recent Labs    04/23/20 0530 05/18/20 1110 05/22/20 0052  BNP 370.5* 748.3* 939.2*     CBG: Recent Labs  Lab 05/21/20 2354 05/23/20 0751  GLUCAP 115* 78    Recent Results (from the past 240 hour(s))  Blood Culture (routine x 2)     Status: None   Collection Time: 05/18/20  9:00 AM   Specimen: BLOOD  Result Value Ref Range Status   Specimen Description BLOOD LEFT ANTECUBITAL  Final   Special Requests   Final    BOTTLES DRAWN AEROBIC AND ANAEROBIC Blood Culture results may not be optimal due to an inadequate volume of blood received in culture bottles   Culture   Final    NO GROWTH 5 DAYS Performed at [facility],  [address] [place], [place] [address]    Report Status 05/23/2020 FINAL  Final  Blood Culture (routine x 2)     Status: None   Collection Time: 05/18/20  9:00 AM   Specimen: BLOOD  Result Value Ref Range Status   Specimen Description BLOOD BLOOD LEFT HAND  Final   Special Requests   Final    BOTTLES DRAWN AEROBIC AND ANAEROBIC Blood Culture results may not be optimal due to an inadequate volume of blood received in culture bottles   Culture   Final    NO GROWTH 5 DAYS Performed at [facility], [address] [place], [place] [address]    Report Status 05/23/2020 FINAL  Final  Resp Panel by RT-PCR (Flu A&B, Covid) Nasopharyngeal Swab     Status: Abnormal   Collection Time: 05/18/20 10:04 AM   Specimen: Nasopharyngeal Swab; Nasopharyngeal(NP) swabs in vial transport medium  Result Value Ref Range Status   SARS Coronavirus 2 by RT PCR POSITIVE (A) NEGATIVE Corrected    Comment: RESULT CALLED TO, READ BACK BY AND VERIFIED WITH:  TAYLOR LEWIS AT 1115 05/18/20 SDR (NOTE) SARS-CoV-2 target nucleic acids are DETECTED.  The SARS-CoV-2 RNA is generally detectable in upper respiratory specimens during the acute phase of infection. Positive results are indicative of the presence of the identified virus, but do not rule out bacterial infection or co-infection with other pathogens not detected by the test. Clinical correlation with patient history and other diagnostic information is necessary to determine patient infection status. The expected result is Negative.  Fact Sheet for Patients: EntrepreneurPulse.com.au  Fact Sheet for Healthcare Providers: IncredibleEmployment.be  This test is not yet approved or cleared by the [place] FDA and  has been authorized for detection and/or diagnosis of SARS-CoV-2 by FDA under an Emergency Use Authorization (EUA).  This EUA will remain in effect (meaning this test can be  used) for the  duration of  the COVID-19 declaration under Section 564(b)(1) of the Act, 21 U.S.C. section 360bbb-3(b)(1), unless the authorization is terminated or revoked sooner.  CORRECTED ON 03/26 AT 0981: PREVIOUSLY REPORTED AS POSITIVE RESULT CALLED TO, READ BACK BY AND VERIFIED WITH:  TAYLOR LEWIS AT 1115  04/20/20 SDR    Influenza A by PCR NEGATIVE NEGATIVE Final   Influenza B by PCR NEGATIVE NEGATIVE Final    Comment: (NOTE) The Xpert Xpress SARS-CoV-2/FLU/RSV plus assay is intended as an aid in the diagnosis of influenza from Nasopharyngeal swab specimens and should not be used as a sole basis for treatment. Nasal washings and aspirates are unacceptable for Xpert Xpress SARS-CoV-2/FLU/RSV testing.  Fact Sheet for Patients: EntrepreneurPulse.com.au  Fact Sheet for Healthcare Providers: IncredibleEmployment.be  This test is not yet approved or cleared by the [place] FDA and has been authorized for detection and/or diagnosis of SARS-CoV-2 by FDA under an Emergency Use Authorization (EUA). This EUA will remain in effect (meaning this test can be used) for the duration of the COVID-19 declaration under Section 564(b)(1) of the Act, 21 U.S.C. section 360bbb-3(b)(1), unless the authorization is terminated or revoked.  Performed at [facility], [address], [place], [place] [address]   Urine culture     Status: None   Collection Time: 05/18/20 11:45 AM   Specimen: Urine, Random  Result Value Ref Range Status   Specimen Description   Final    URINE, RANDOM Performed at [facility], [address], [place], [place] [address]    Special Requests   Final    NONE Performed at [facility], [address], [place], [place] [address]    Culture   Final    NO GROWTH Performed at [facility], [place] [address] [place], [place] [address]    Report Status 05/19/2020 FINAL  Final  MRSA PCR Screening     Status:  None   Collection Time: 05/18/20  3:48 PM   Specimen: Nasal Mucosa; Nasopharyngeal  Result Value Ref Range Status   MRSA by PCR NEGATIVE NEGATIVE Final    Comment:        The GeneXpert MRSA Assay (FDA approved for NASAL specimens only), is one component of a comprehensive MRSA colonization surveillance program. It is not intended to diagnose MRSA infection nor to guide or monitor treatment for MRSA infections. Performed at [facility], [address] [place], [place] [address]   MRSA PCR Screening     Status: None   Collection Time: 05/23/20  8:16 AM   Specimen: Nasal Mucosa; Nasopharyngeal  Result Value Ref Range Status   MRSA by PCR NEGATIVE NEGATIVE Final    Comment:        The GeneXpert MRSA Assay (FDA approved for NASAL specimens only), is one component of a comprehensive MRSA colonization surveillance program. It is not intended to diagnose MRSA infection nor to guide or monitor treatment for MRSA infections. Performed at [facility], [address] [place], [place] [address]   Body fluid culture w Gram Stain     Status: None (Preliminary result)   Collection Time: 05/24/20 11:58 AM   Specimen: PATH Cytology Pleural fluid  Result Value Ref Range Status   Specimen Description   Final    PLEURAL Performed at [facility], [address] [place], [place] [address]    Special Requests   Final    NONE Performed at [facility], [address] [place], [place] [address]    Gram Stain   Final    FEW WBC PRESENT, PREDOMINANTLY MONONUCLEAR NO ORGANISMS SEEN    Culture   Final    NO GROWTH 2 DAYS Performed at [facility], [place] [address], [place], [place] [address]  Report Status PENDING  Incomplete     Studies: CT HEAD WO CONTRAST  Result Date: 05/24/2020 CLINICAL DATA:  Mental status change EXAM: CT HEAD WITHOUT CONTRAST TECHNIQUE: Contiguous axial images were obtained from the base of the skull through  the vertex without intravenous contrast. COMPARISON:  05/22/2020 FINDINGS: Brain: There is no acute intracranial hemorrhage, mass effect, or edema. No new loss of gray-white differentiation. Small chronic left cerebellar infarct. Patchy hypoattenuation in the supratentorial white matter likely reflects stable chronic microvascular ischemic changes. There is no extra-axial fluid collection. Ventricles and sulci are stable in size and configuration. Vascular: There is atherosclerotic calcification at the skull base. Skull: Calvarium is unremarkable. Sinuses/Orbits: Mild mucosal thickening. No acute orbital abnormality. Other: None. IMPRESSION: No acute intracranial abnormality. No significant change since recent prior study. Electronically Signed   By: Macy Mis M.D.   On: 05/24/2020 17:14    Scheduled Meds: . atorvastatin  20 mg Oral Daily  . bisacodyl  10 mg Rectal QHS  . clopidogrel  75 mg Oral Daily  . colchicine  0.3 mg Oral Once  . dextromethorphan-guaiFENesin  1 tablet Oral BID  . enoxaparin (LOVENOX) injection  30 mg Subcutaneous Q24H  . ipratropium-albuterol  3 mL Nebulization BID  . iron polysaccharides  150 mg Oral Daily  . levETIRAcetam  250 mg Oral BID  . levothyroxine  100 mcg Oral Q0600  . magnesium oxide  400 mg Oral Daily  . mometasone-formoterol  2 puff Inhalation BID  . montelukast  10 mg Oral QHS  . mupirocin ointment  1 application Nasal BID  . pantoprazole  20 mg Oral Daily  . predniSONE  20 mg Oral Q breakfast  . pregabalin  50 mg Oral BID  . sodium chloride flush  10-40 mL Intracatheter Q12H  . sodium phosphate  1 enema Rectal Daily   Continuous Infusions: . sodium chloride 30 mL/hr at 05/24/20 1223  . piperacillin-tazobactam (ZOSYN)  IV 3.375 g (05/26/20 7619)    Assessment/Plan:  1. Severe sepsis, present on admission with multifocal pneumonia.  Left pleural effusion.  Thoracentesis of the left-sided pleural effusion on 05/24/2020 removed 250 mL of fluid.   So far cultures are negative.  Patient on Zosyn.  Likely will finish up antibiotics tomorrow.  Repeat chest x-ray tomorrow morning. 2. Pain bilateral feet and ankles and legs.  Could be peripheral neuropathy.  Restarted Lyrica 25 mg this morning.  Will increase to 50 mg twice daily later on.  Uric acid not high but will give 1 dose of colchicine and low-dose prednisone just in case gout or other inflammatory process. 3. Acute metabolic encephalopathy.  This has improved from 2 days ago but patient's daughter still thinks she is confused at times.  Repeat CT scan of the head negative.  Still need to be careful with tramadol.  Watch closely with restarting Lyrica. 4. NSTEMI.  Medical management as per cardiology.  Continue Plavix and atorvastatin. 5. Acute hypoxic respiratory failure secondary to sepsis and pneumonia.  2 days ago had a pulse ox of 85% on room air.  Check a pulse ox on room air today and see if we can get her off the oxygen. 6. Acute kidney injury on chronic kidney disease stage IIIb.  Patient's creatinine on 3/29 was 1.49 and went up to 1.84.  Today's creatinine down to 1.49 and I will discontinue fluids today. 7. Paroxysmal atrial fibrillation.  Not on major anticoagulation secondary to GI bleed and fall risk 8. Essential hypertension.  Holding amlodipine 9. History of seizures on Keppra 10. Weakness.  Physical therapy still recommending rehab. 11. Iron deficiency anemia.  On iron supplementation.  Last hemoglobin 9.0.        Code Status:     Code Status Orders  (From admission, onward)         Start     Ordered   05/18/20 1439  Do not attempt resuscitation (DNR)  Continuous       Question Answer Comment  In the event of cardiac or respiratory ARREST Do not call a "code blue"   In the event of cardiac or respiratory ARREST Do not perform Intubation, CPR, defibrillation or ACLS   In the event of cardiac or respiratory ARREST Use medication by any route, position, wound  care, and other measures to relive pain and suffering. May use oxygen, suction and manual treatment of airway obstruction as needed for comfort.      05/18/20 1439        Code Status History    Date Active Date Inactive Code Status Order ID Comments User Context   04/23/2020 1851 04/24/2020 2335 DNR [id number]  Ivor Costa, MD Inpatient   03/06/2020 1332 03/08/2020 2259 DNR [id number]  Gwynne Edinger, MD ED   02/02/2020 2244 02/14/2020 1927 DNR [id number]  Rhetta Mura, DO ED   02/02/2020 1745 02/02/2020 2244 DNR [id number]  Rhetta Mura, DO ED   08/18/2019 1932 08/23/2019 2056 DNR [id number]  Athena Masse, MD ED   08/12/2019 1610 08/15/2019 1935 DNR [id number]  Ivor Costa, MD ED   08/12/2019 1447 08/12/2019 1610 DNR [id number]  Merlyn Lot, MD ED   08/06/2019 1455 08/07/2019 1551 DNR [id number]  Max Sane, MD ED   05/16/2017 0217 05/20/2017 1758 DNR [id number]  Lance Coon, MD Inpatient   09/21/2016 1245 09/26/2016 1836 DNR [id number]  [name], MD ED   10/01/2015 1144 10/02/2015 0748 Full Code [id number]  Bettey Costa, MD Inpatient   Advance Care Planning Activity     Family Communication: Spoke with patient's daughter on the phone Disposition Plan: Status is: Inpatient  Dispo: The patient is from: Long-term care              Anticipated d/c is to: Long-term care              Patient currently still on IV antibiotics.  Trying to taper off oxygen.   Difficult to place patient.  No.  Time spent: 27 minutes  [place]

## 2020-05-26 NOTE — Progress Notes (Addendum)
Progress Note  Patient Name: Amy [name] Date of Encounter: 05/26/2020  Primary Cardiologist: Rockey Situ  Subjective   Up sitting on the side of the bed eating breakfast. Feels much better compared to the past 24 hours. S/p thoracentesis with 250 cc fluid removed on 3/31. No chest pain.   Inpatient Medications    Scheduled Meds: . atorvastatin  20 mg Oral Daily  . bisacodyl  10 mg Rectal QHS  . clopidogrel  75 mg Oral Daily  . dextromethorphan-guaiFENesin  1 tablet Oral BID  . enoxaparin (LOVENOX) injection  30 mg Subcutaneous Q24H  . ipratropium-albuterol  3 mL Nebulization BID  . iron polysaccharides  150 mg Oral Daily  . levETIRAcetam  250 mg Oral BID  . levothyroxine  100 mcg Oral Q0600  . mometasone-formoterol  2 puff Inhalation BID  . montelukast  10 mg Oral QHS  . mupirocin ointment  1 application Nasal BID  . pantoprazole  20 mg Oral Daily  . sodium chloride flush  10-40 mL Intracatheter Q12H  . sodium phosphate  1 enema Rectal Daily   Continuous Infusions: . sodium chloride 30 mL/hr at 05/24/20 1223  . piperacillin-tazobactam (ZOSYN)  IV 3.375 g (05/26/20 0619)   PRN Meds: acetaminophen, albuterol, docusate sodium, melatonin, nitroGLYCERIN, ondansetron (ZOFRAN) IV, polyethylene glycol, sodium chloride flush, traMADol   Vital Signs    Vitals:   05/25/20 1114 05/25/20 2240 05/26/20 0625 05/26/20 0746  BP: 124/74 (!) 128/42 (!) 149/54 (!) 153/51  Pulse: 70 74 67 69  Resp: 18 18 15 18   Temp: 98.4 F (36.9 C) 97.6 F (36.4 C) 98.1 F (36.7 C) 97.9 F (36.6 C)  TempSrc:  Oral  Oral  SpO2: 95% 98% 99% 96%  Weight:   82.8 kg   Height:        Intake/Output Summary (Last 24 hours) at 05/26/2020 0825 Last data filed at 05/26/2020 0626 Gross per 24 hour  Intake 584.04 ml  Output 626 ml  Net -41.96 ml   Filed Weights   05/24/20 0500 05/25/20 0449 05/26/20 0625  Weight: 82.6 kg 81.5 kg 82.8 kg    Telemetry    SR with rare PVC - Personally  Reviewed  ECG    No new tracings - Personally Reviewed  Physical Exam   GEN: No acute distress. Elderly and frail appearing.   Neck: No JVD. Cardiac: RRR, I/VI systolic murmur, no rubs, or gallops.  Respiratory: Diminished breath sounds, particularly at the left base. Poor inspiratory effort.  GI: Soft, nontender, non-distended.   MS: No edema; No deformity. Neuro:  Alert and oriented x 3.  Psych: Responds to questions appropriately with a normal affect.  Labs    Chemistry Recent Labs  Lab 05/22/20 0244 05/23/20 0452 05/24/20 0555 05/25/20 0731 05/26/20 0432  NA 139   < > 143 141 143  K 4.7   < > 4.4 4.5 4.2  CL 110   < > 111 112* 113*  CO2 23   < > 22 22 26   GLUCOSE 109*   < > 97 112* 126*  BUN 41*   < > 43* 42* 41*  CREATININE 1.49*   < > 1.84* 1.74* 1.49*  CALCIUM 7.9*   < > 8.4* 8.3* 8.2*  PROT 4.7*  --   --   --   --   ALBUMIN 2.2*  --   --   --   --   AST 12*  --   --   --   --  ALT 8  --   --   --   --   ALKPHOS 64  --   --   --   --   BILITOT 0.6  --   --   --   --   GFRNONAA 33*   < > 26* 27* 33*  ANIONGAP 6   < > 10 7 4*   < > = values in this interval not displayed.     Hematology Recent Labs  Lab 05/24/20 0555 05/25/20 0600 05/26/20 0432  WBC 15.2* 13.3* 13.9*  RBC 2.88* 2.94* 3.03*  HGB 8.5* 8.8* 9.0*  HCT 27.6* 27.9* 28.8*  MCV 95.8 94.9 95.0  MCH 29.5 29.9 29.7  MCHC 30.8 31.5 31.3  RDW 15.0 15.7* 15.0  PLT 205 190 219    Cardiac EnzymesNo results for input(s): TROPONINI in the last 168 hours. No results for input(s): TROPIPOC in the last 168 hours.   BNP Recent Labs  Lab 05/22/20 0052  BNP 939.2*     DDimer  Recent Labs  Lab 05/22/20 0052  DDIMER 1.99*     Radiology    CT head 05/24/2020: IMPRESSION: No acute intracranial abnormality. No significant change since recent prior study. __________  CXR 05/22/2020: IMPRESSION: Large left pleural effusion with worsening left upper consolidation. __________  CT head  05/22/2020: IMPRESSION: 1. Chronic ischemic microangiopathy without acute intracranial abnormality. 2. ASPECTS is 10. __________  CT chest 05/18/2020: IMPRESSION: 1. Extensive left-sided pneumonia opacifying most of the left lung. Small left parapneumonic effusion. 2. No acute intra-abdominal finding. 3. Numerous chronic/known incidental findings which are described Above. __________  CXR 05/18/2020: IMPRESSION: Widespread airspace opacity throughout much of the left lung consistent with multifocal pneumonia. Right lung clear. There is cardiomegaly with a degree of pulmonary vascular congestion. There is calcification in each carotid artery. Aortic Atherosclerosis (ICD10-I70.0).  Cardiac Studies   Limited echo 05/23/2020: 1. Left ventricular ejection fraction, by estimation, is 55 to 60%. The  left ventricle has normal function. Left ventricular endocardial border  not optimally defined to evaluate regional wall motion. Left ventricular  diastolic function could not be  evaluated.  2. Right ventricular systolic function is normal. The right ventricular  size is normal. Tricuspid regurgitation signal is inadequate for assessing  PA pressure.  3. Left atrial size was mildly dilated.  4. Moderate pleural effusion.  5. The mitral valve is abnormal. No evidence of mitral valve  regurgitation. No evidence of mitral stenosis. Moderate mitral annular  calcification.  6. The aortic valve is normal in structure. Aortic valve regurgitation is  not visualized. Mild aortic valve stenosis. No gradients were measured.  7. limited and challenging study. __________  Carlton Adam MPI 03/2017: Lexiscan MPI 04/23/2017: Pharmacological myocardial perfusion imaging study with no significant Ischemia Small region of fixed apical thinning, consistent with attenuation artifact GI uptake artifact noted Normal wall motion, EF estimated at 56% No EKG changes concerning for ischemia at peak stress  or in recovery. Low risk scan  Patient Profile     87 y.o. 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   Assessment & Plan    1. NSTEMI -Patient with known  CAD with prior LAD stenting in 2011 -Mildly elevated high sensitivity troponin peaking at 1196 felt to be related to supply demand ischemia in the setting of sepsis related to PNA, COPD, hypotension, CKD stage IV, and anemia of chronic disease -Echo this admission demonstrated a preserved LVSF  -Status post treatment with IV heparin, which was discontinued after 48 hours of therapy  -Plavix without aspirin given prior GI bleed and frequent falls  -Not on beta blocker given prior beta blocker-induced bradycardia  -Given age and comorbid conditions, interventional cardiology does not recommend ischemic testing  2. PAF: -Not on [place] given history of GI bleed and frequent falls -Currently in sinus rhythm with rates in the 70s to 80s bpm -Not on a beta blocker as above  3. Left pleural effusion/HFpEF: -Given IV fluids upon initial presentation in the setting of sepsis with PNA and hypotension, which is possibly contributing to her effusion, vs in the setting of her underlying PNA vs third spacing in the settng hypoalbuminemia -Status post thoracentesis 3/31 with 250 cc fluid removed with cytology negative for malignancy  4. HTN: -Amlodipine held 3/31 with BP trending up, resume as indicated per primary    5. HLD: -LDL 36 -Continue Lipitor   6. Sepsis with PNA/encephalopathy: -Mental status is significantly improved, especially when compared to 3/31 -CT head 3/31 without acute intracranial  process -Per IM  7. CKD stage IV: -Renal function is stable to improved with gentle IV hydration   8. Anemia of chronic disease: -Low, though stable  9. Acute hypoxic respiratory failure: -Has required BiPAP, currently on supplemental oxygen via nasal cannula at 2 L -Improving  -Likely in the setting of underlying PNA, HFpEF, and pleural effusion -Status post thoracentesis as above  10. Hypoalbuminemia: -Appetite is significantly improved this morning -Prior poor appetite was likely contributing to her overall presentation  -Supplement with Boost/Ensure   For questions or updates, please contact [place] Please consult [email or web address] for contact info under Cardiology/STEMI.    Signed, Christell Faith, PA-C [facility] Pager: [phone] 05/26/2020, 8:25 AM  I have seen and examined this patient with Christell Faith.  Agree with above, note added to reflect my findings.  On exam, RRR, 1/6 systolic murmur, diminished left breath sounds.  Patient's only complaint this morning is foot pain.  She was told that she may have gout.  She has had no chest pain.  She is currently on Plavix for her non-STEMI.  Avoiding aspirin due to history of falls.  Also avoiding any further invasive testing.  Would continue Plavix at discharge.  We Sharece Fleischhacker arrange for follow-up in cardiology clinic.  Hailie Searight M. Shyne Resch MD 05/26/2020 11:11 AM

## 2020-05-26 NOTE — Progress Notes (Signed)
SVN given. Patient c/o sob. Room air saturation noted 92-93%. Attempted to place patient on bipap as I stated it would help her with breathing. Patient stated no, that was not something she needed right now. Breathing is not any worse than normal. Will continue to monitor

## 2020-05-27 ENCOUNTER — Inpatient Hospital Stay: Payer: Medicare Other

## 2020-05-27 DIAGNOSIS — J189 Pneumonia, unspecified organism: Secondary | ICD-10-CM | POA: Diagnosis not present

## 2020-05-27 DIAGNOSIS — J90 Pleural effusion, not elsewhere classified: Secondary | ICD-10-CM | POA: Diagnosis not present

## 2020-05-27 DIAGNOSIS — A419 Sepsis, unspecified organism: Secondary | ICD-10-CM | POA: Diagnosis not present

## 2020-05-27 DIAGNOSIS — R52 Pain, unspecified: Secondary | ICD-10-CM | POA: Diagnosis not present

## 2020-05-27 LAB — CBC
HCT: 30.3 % — ABNORMAL LOW (ref 36.0–46.0)
Hemoglobin: 9.6 g/dL — ABNORMAL LOW (ref 12.0–15.0)
MCH: 29.7 pg (ref 26.0–34.0)
MCHC: 31.7 g/dL (ref 30.0–36.0)
MCV: 93.8 fL (ref 80.0–100.0)
Platelets: 235 10*3/uL (ref 150–400)
RBC: 3.23 MIL/uL — ABNORMAL LOW (ref 3.87–5.11)
RDW: 14.6 % (ref 11.5–15.5)
WBC: 12.3 10*3/uL — ABNORMAL HIGH (ref 4.0–10.5)
nRBC: 0 % (ref 0.0–0.2)

## 2020-05-27 LAB — BODY FLUID CULTURE W GRAM STAIN: Culture: NO GROWTH

## 2020-05-27 LAB — BASIC METABOLIC PANEL
Anion gap: 7 (ref 5–15)
BUN: 35 mg/dL — ABNORMAL HIGH (ref 8–23)
CO2: 24 mmol/L (ref 22–32)
Calcium: 8.2 mg/dL — ABNORMAL LOW (ref 8.9–10.3)
Chloride: 113 mmol/L — ABNORMAL HIGH (ref 98–111)
Creatinine, Ser: 1.39 mg/dL — ABNORMAL HIGH (ref 0.44–1.00)
GFR, Estimated: 36 mL/min — ABNORMAL LOW (ref >60)
Glucose, Bld: 138 mg/dL — ABNORMAL HIGH (ref 70–99)
Potassium: 4.2 mmol/L (ref 3.5–5.1)
Sodium: 144 mmol/L (ref 135–145)

## 2020-05-27 LAB — PH, BODY FLUID: pH, Body Fluid: 7.6

## 2020-05-27 LAB — MAGNESIUM: Magnesium: 1.8 mg/dL (ref 1.7–2.4)

## 2020-05-27 MED ORDER — AMLODIPINE BESYLATE 5 MG PO TABS
5.0000 mg | ORAL_TABLET | Freq: Every day | ORAL | Status: DC
  Administered 2020-05-27: 5 mg via ORAL
  Filled 2020-05-27 (×2): qty 1

## 2020-05-27 NOTE — Progress Notes (Signed)
Patient ID: Amy [name], female   DOB: December 10, 1927, 87 y.o.   MRN: [id number] [facility] PROGRESS NOTE  Nataki Mccrumb Seckel HUD:[id number] DOB: January 26, 1928 DOA: 05/18/2020 PCP: Alvester Morin, MD  HPI/Subjective: Patient seen this morning was feeling a little bit better.  Still has occasional shortness of breath and occasional cough.  Her feet pain is better today than it was yesterday but when I pressed harder she did have pain.  Blood pressure very variable today and restarted amlodipine.  Objective: Vitals:   05/27/20 0821 05/27/20 1150  BP: (!) 147/67 (!) 105/55  Pulse: 75 73  Resp:  16  Temp:  98.1 F (36.7 C)  SpO2: 92% 94%    Intake/Output Summary (Last 24 hours) at 05/27/2020 1424 Last data filed at 05/27/2020 1300 Gross per 24 hour  Intake 296.41 ml  Output 1050 ml  Net -753.59 ml   Filed Weights   05/25/20 0449 05/26/20 0625 05/27/20 0500  Weight: 81.5 kg 82.8 kg 82.9 kg    ROS: Review of Systems  Respiratory: Positive for cough and shortness of breath.   Cardiovascular: Negative for chest pain.  Gastrointestinal: Negative for abdominal pain, nausea and vomiting.   Exam: Physical Exam HENT:     Head: Normocephalic.     Mouth/Throat:     Pharynx: No oropharyngeal exudate.  Eyes:     General: Lids are normal.     Conjunctiva/sclera: Conjunctivae normal.  Cardiovascular:     Rate and Rhythm: Normal rate and regular rhythm.     Heart sounds: Normal heart sounds, S1 normal and S2 normal.  Pulmonary:     Breath sounds: Examination of the right-lower field reveals decreased breath sounds. Examination of the left-lower field reveals decreased breath sounds and rhonchi. Decreased breath sounds and rhonchi present. No wheezing or rales.  Abdominal:     Palpations: Abdomen is soft.     Tenderness: There is no abdominal tenderness.  Musculoskeletal:     Right lower leg: Swelling present.     Left lower leg: Swelling present.  Skin:    General: Skin  is warm.     Findings: No rash.  Neurological:     Mental Status: She is alert.     Comments: Patient answers questions appropriately. Able to straight leg raise a little bit.       Data Reviewed: Basic Metabolic Panel: Recent Labs  Lab 05/23/20 0452 05/24/20 0555 05/25/20 0731 05/26/20 0432 05/27/20 0543  NA 141 143 141 143 144  K 4.6 4.4 4.5 4.2 4.2  CL 111 111 112* 113* 113*  CO2 24 22 22 26 24   GLUCOSE 77 97 112* 126* 138*  BUN 42* 43* 42* 41* 35*  CREATININE 1.55* 1.84* 1.74* 1.49* 1.39*  CALCIUM 8.3* 8.4* 8.3* 8.2* 8.2*  MG 1.9 1.8 1.9 1.8 1.8   Liver Function Tests: Recent Labs  Lab 05/22/20 0244  AST 12*  ALT 8  ALKPHOS 64  BILITOT 0.6  PROT 4.7*  ALBUMIN 2.2*   CBC: Recent Labs  Lab 05/23/20 0452 05/24/20 0555 05/25/20 0600 05/26/20 0432 05/27/20 0543  WBC 6.9 15.2* 13.3* 13.9* 12.3*  HGB 8.7* 8.5* 8.8* 9.0* 9.6*  HCT 28.3* 27.6* 27.9* 28.8* 30.3*  MCV 95.6 95.8 94.9 95.0 93.8  PLT 191 205 190 219 235   BNP (last 3 results) Recent Labs    04/23/20 0530 05/18/20 1110 05/22/20 0052  BNP 370.5* 748.3* 939.2*    CBG: Recent Labs  Lab 05/21/20 2354 05/23/20 0751  GLUCAP 115* 78    Recent Results (from the past 240 hour(s))  Blood Culture (routine x 2)     Status: None   Collection Time: 05/18/20  9:00 AM   Specimen: BLOOD  Result Value Ref Range Status   Specimen Description BLOOD LEFT ANTECUBITAL  Final   Special Requests   Final    BOTTLES DRAWN AEROBIC AND ANAEROBIC Blood Culture results may not be optimal due to an inadequate volume of blood received in culture bottles   Culture   Final    NO GROWTH 5 DAYS Performed at [facility], [address] [place], [place] [address]    Report Status 05/23/2020 FINAL  Final  Blood Culture (routine x 2)     Status: None   Collection Time: 05/18/20  9:00 AM   Specimen: BLOOD  Result Value Ref Range Status   Specimen Description BLOOD BLOOD LEFT HAND  Final   Special  Requests   Final    BOTTLES DRAWN AEROBIC AND ANAEROBIC Blood Culture results may not be optimal due to an inadequate volume of blood received in culture bottles   Culture   Final    NO GROWTH 5 DAYS Performed at [facility], [address] [place], [place] [address]    Report Status 05/23/2020 FINAL  Final  Resp Panel by RT-PCR (Flu A&B, Covid) Nasopharyngeal Swab     Status: Abnormal   Collection Time: 05/18/20 10:04 AM   Specimen: Nasopharyngeal Swab; Nasopharyngeal(NP) swabs in vial transport medium  Result Value Ref Range Status   SARS Coronavirus 2 by RT PCR POSITIVE (A) NEGATIVE Corrected    Comment: RESULT CALLED TO, READ BACK BY AND VERIFIED WITH:  TAYLOR LEWIS AT 1115 05/18/20 SDR (NOTE) SARS-CoV-2 target nucleic acids are DETECTED.  The SARS-CoV-2 RNA is generally detectable in upper respiratory specimens during the acute phase of infection. Positive results are indicative of the presence of the identified virus, but do not rule out bacterial infection or co-infection with other pathogens not detected by the test. Clinical correlation with patient history and other diagnostic information is necessary to determine patient infection status. The expected result is Negative.  Fact Sheet for Patients: EntrepreneurPulse.com.au  Fact Sheet for Healthcare Providers: IncredibleEmployment.be  This test is not yet approved or cleared by the [place] FDA and  has been authorized for detection and/or diagnosis of SARS-CoV-2 by FDA under an Emergency Use Authorization (EUA).  This EUA will remain in effect (meaning this test can be  used) for the duration of  the COVID-19 declaration under Section 564(b)(1) of the Act, 21 U.S.C. section 360bbb-3(b)(1), unless the authorization is terminated or revoked sooner.  CORRECTED ON 03/26 AT 2952: PREVIOUSLY REPORTED AS POSITIVE RESULT CALLED TO, READ BACK BY AND VERIFIED WITH:  Lovena Le  LEWIS AT 8413 04/20/20 SDR    Influenza A by PCR NEGATIVE NEGATIVE Final   Influenza B by PCR NEGATIVE NEGATIVE Final    Comment: (NOTE) The Xpert Xpress SARS-CoV-2/FLU/RSV plus assay is intended as an aid in the diagnosis of influenza from Nasopharyngeal swab specimens and should not be used as a sole basis for treatment. Nasal washings and aspirates are unacceptable for Xpert Xpress SARS-CoV-2/FLU/RSV testing.  Fact Sheet for Patients: EntrepreneurPulse.com.au  Fact Sheet for Healthcare Providers: IncredibleEmployment.be  This test is not yet approved or cleared by the [place] FDA and has been authorized for detection and/or diagnosis of SARS-CoV-2 by FDA under an Emergency Use Authorization (EUA). This EUA will remain  in effect (meaning this test can be used) for the duration of the COVID-19 declaration under Section 564(b)(1) of the Act, 21 U.S.C. section 360bbb-3(b)(1), unless the authorization is terminated or revoked.  Performed at [facility], [address] [place], [place] [address]   Urine culture     Status: None   Collection Time: 05/18/20 11:45 AM   Specimen: Urine, Random  Result Value Ref Range Status   Specimen Description   Final    URINE, RANDOM Performed at [facility], [address] [place], [place] [address]    Special Requests   Final    NONE Performed at [facility], [address] [place], [place] [address]    Culture   Final    NO GROWTH Performed at [facility], [place] [address] [place], [place] [address]    Report Status 05/19/2020 FINAL  Final  MRSA PCR Screening     Status: None   Collection Time: 05/18/20  3:48 PM   Specimen: Nasal Mucosa; Nasopharyngeal  Result Value Ref Range Status   MRSA by PCR NEGATIVE NEGATIVE Final    Comment:        The GeneXpert MRSA Assay (FDA approved for NASAL specimens only), is one component of a comprehensive MRSA  colonization surveillance program. It is not intended to diagnose MRSA infection nor to guide or monitor treatment for MRSA infections. Performed at [facility], [address] [place], [place] [address]   MRSA PCR Screening     Status: None   Collection Time: 05/23/20  8:16 AM   Specimen: Nasal Mucosa; Nasopharyngeal  Result Value Ref Range Status   MRSA by PCR NEGATIVE NEGATIVE Final    Comment:        The GeneXpert MRSA Assay (FDA approved for NASAL specimens only), is one component of a comprehensive MRSA colonization surveillance program. It is not intended to diagnose MRSA infection nor to guide or monitor treatment for MRSA infections. Performed at [facility], [address] [place], [place] [address]   Body fluid culture w Gram Stain     Status: None   Collection Time: 05/24/20 11:58 AM   Specimen: PATH Cytology Pleural fluid  Result Value Ref Range Status   Specimen Description   Final    PLEURAL Performed at [facility], [address] [place], [place] [address]    Special Requests   Final    NONE Performed at [facility], [address] [place], [place] [address]    Gram Stain   Final    FEW WBC PRESENT, PREDOMINANTLY MONONUCLEAR NO ORGANISMS SEEN    Culture   Final    NO GROWTH 3 DAYS Performed at [facility], [place] [address], [place],  [address]    Report Status 05/27/2020 FINAL  Final     Studies: DG Chest Port 1 View  Result Date: 05/27/2020 CLINICAL DATA:  Pneumonia. EXAM: PORTABLE CHEST 1 VIEW COMPARISON:  05/24/2020; 05/22/2020 FINDINGS: Grossly unchanged cardiac silhouette and mediastinal contours with persistent obscuration of the right heart border secondary to unchanged left-sided pleural effusion and associated left mid lower lung consolidative opacities, similar to the 05/22/2020 examination. Atherosclerotic plaque within the aortic arch and mitral valve calcifications. Mild  pulmonary venous congestion within the otherwise well aerated right lung. No definite right-sided pleural effusion though the right costophrenic angle is excluded from view. No acute osseous abnormalities. IMPRESSION: 1. Similar appearing extensive left lung consolidative opacities  and small left-sided effusion worrisome for multifocal pneumonia. 2. Suspected pulmonary venous congestion within the otherwise well aerated right lung. Electronically Signed   By: Sandi Mariscal M.D.   On: 05/27/2020 09:30    Scheduled Meds: . amLODipine  5 mg Oral Daily  . atorvastatin  20 mg Oral Daily  . bisacodyl  10 mg Rectal QHS  . clopidogrel  75 mg Oral Daily  . dextromethorphan-guaiFENesin  1 tablet Oral BID  . enoxaparin (LOVENOX) injection  30 mg Subcutaneous Q24H  . ipratropium-albuterol  3 mL Nebulization BID  . iron polysaccharides  150 mg Oral Daily  . levETIRAcetam  250 mg Oral BID  . levothyroxine  100 mcg Oral Q0600  . magnesium oxide  400 mg Oral Daily  . mometasone-formoterol  2 puff Inhalation BID  . montelukast  10 mg Oral QHS  . pantoprazole  20 mg Oral Daily  . predniSONE  20 mg Oral Q breakfast  . pregabalin  50 mg Oral BID  . sodium chloride flush  10-40 mL Intracatheter Q12H  . sodium phosphate  1 enema Rectal Daily   Continuous Infusions: . piperacillin-tazobactam (ZOSYN)  IV 3.375 g (05/27/20 0532)    Assessment/Plan:  1. Severe sepsis, present on admission with multifocal pneumonia.  Left pleural effusion with thoracentesis on 05/24/2020 removing 250 mL of fluid.  Cultures are negative.  Patient currently on Zosyn and on day 10 of antibiotics.  Repeat chest x-ray this morning still has multifocal pneumonia and left pleural effusion.  Case discussed with pulmonary Dr. Glenis Smoker often he sees the patient as outpatient and he is okay discontinuing antibiotics but wanted to get a CT scan of the chest for further evaluation. 2. Pain bilateral feet and ankles and legs.  Likely peripheral  neuropathy back on Lyrica 50 mg twice a day.  Uric acid not high but I did give 1 dose of colchicine and start low-dose prednisone just in case gout or other inflammatory process.  Little bit better today.  Reevaluate tomorrow. 3. Acute metabolic encephalopathy this has improved from 3 days ago.  Repeat CT scan of the head negative.  Need to be careful with tramadol. 4. NSTEMI.  Medical management.  Continue Plavix and atorvastatin 5. Acute hypoxic respiratory failure.  The patient did have a pulse ox of 85% on room air.  Currently off oxygen.  Pulmonary did recommend BiPAP at night. 6. Acute kidney injury on chronic kidney disease stage IIIb.  Patient's creatinine on 3/29 was 1.49 and then went up to 1.84.  Creatinine down to 1.39.  Fluids discontinued. 7. Proximal atrial fibrillation.  Patient not on anticoagulation secondary to GI bleed 8. Essential hypertension blood pressure very high this morning and restarted amlodipine 9. History of seizures on Keppra 10. Iron deficiency anemia.  Continue iron supplementation.  Hemoglobin today 9.6. 11. Weakness.  Physical therapy still recommending rehab 12. Urinary retention discontinue Foley catheter today and see if she is able to urinate        Code Status:     Code Status Orders  (From admission, onward)         Start     Ordered   05/18/20 1439  Do not attempt resuscitation (DNR)  Continuous       Question Answer Comment  In the event of cardiac or respiratory ARREST Do not call a "code blue"   In the event of cardiac or respiratory ARREST Do not perform Intubation, CPR, defibrillation or ACLS   In the  event of cardiac or respiratory ARREST Use medication by any route, position, wound care, and other measures to relive pain and suffering. May use oxygen, suction and manual treatment of airway obstruction as needed for comfort.      05/18/20 1439        Code Status History    Date Active Date Inactive Code Status Order ID Comments  User Context   04/23/2020 1851 04/24/2020 2335 DNR [id number]  Ivor Costa, MD Inpatient   03/06/2020 1332 03/08/2020 2259 DNR [id number]  Gwynne Edinger, MD ED   02/02/2020 2244 02/14/2020 1927 DNR [id number]  Rhetta Mura, DO ED   02/02/2020 1745 02/02/2020 2244 DNR [id number]  Rhetta Mura, DO ED   08/18/2019 1932 08/23/2019 2056 DNR [id number]  Athena Masse, MD ED   08/12/2019 1610 08/15/2019 1935 DNR [id number]  Ivor Costa, MD ED   08/12/2019 1447 08/12/2019 1610 DNR [id number]  Merlyn Lot, MD ED   08/06/2019 1455 08/07/2019 1551 DNR [id number]  Max Sane, MD ED   05/16/2017 0217 05/20/2017 1758 DNR [id number]  Lance Coon, MD Inpatient   09/21/2016 1245 09/26/2016 1836 DNR [id number]  [name], MD ED   10/01/2015 1144 10/02/2015 0748 Full Code [id number]  Bettey Costa, MD Inpatient   Advance Care Planning Activity     Family Communication: Spoke with patient's daughter on the phone Disposition Plan: Status is: Inpatient  Dispo: The patient is from: Long-term care              Anticipated d/c is to: Long-term care              Patient currently removing Foley catheter today see if she is able to urinate.  Pulmonary wanted to get a CT scan of the chest for further evaluation and recommendations.   Difficult to place patient.  Hopefully not  Time spent: 28 minutes  [place]

## 2020-05-27 NOTE — Consult Note (Signed)
Pulmonary Medicine          Date: 05/27/2020,   MRN# [id number] Amy [name] [id number]     AdmissionWeight: 86.2 kg                 CurrentWeight: 82.9 kg   Referring physician: Dr Earleen Newport    CHIEF COMPLAINT:   Multifocal Pneumonia with parapneumonic effusion.   HISTORY OF PRESENT ILLNESS   As per admission h/p Patient states that she started having abdominal pain which was located in the left side of the abdomen, associated with nausea and diarrhea.  She has had 3-4 times of loose stool bowel movement.  She also had fever of 101 in facility, currently body temperature is 97.5 in ED. Patient has cough and shortness of breath which has been progressively worsening.  She was found to have oxygen desaturation to 85% on room air, which improved to 94% on 5 L oxygen.  Denies chest pain.  No rectal bleeding or dark stool.  Pt was found to have WBC 4.7, lactic acid 2.9, 1.5, INR 1.1, PTT 31, troponin level 638, 933, positive Covid PCR, renal function close to baseline, temperature 97.5, softer blood pressure 93/39, heart rate 98, RR 28.  Chest x-ray showed multifocal infiltration mainly in the left side, vascular congestion and cardiomegaly.  CT of the chest/abdomen/pelvis showed left lower lobe infiltration,  no acute issues intra-abdominally. After 9 day pneumonia tx and thoracentesis patient still has bilateral infiltrates and PCCM consultation placed for additional evaluation and management.    PAST MEDICAL HISTORY   Past Medical History:  Diagnosis Date  . Bladder incontinence   . CAD (coronary artery disease)    a. 05/2009 Cath: LAD 90p (Xience 2.75 x 12 mm DES), 77m D1 40, LCx 40p/m, RCA 30/40/30p/m, RCA 30/25d;  b.  12/2011 Lexiscan MV: no ischemia, breast attenuation artifact, normal EF-->Low risk; c. 10/2016 MV: fixed apical defect, most likely apical thinning and attenuation, No ischemia, EF 60%.  . Cancer ([place]    ovarian  . Carotid arterial disease w/ R Carotid  Bruit (HCC)    a. 08/2016 Carotid U/S: 40-59% bilat ICA stenosis - f/u 1 yr.  . Chronic diastolic (congestive) heart failure ([facility]    a. Echo 09/2015: EF 55-60% w/ Grade 1 DD, sev Ca2+ MV annulus, mildly dil LA; b. 09/2016 Echo: EF 65-70%, Gr2 DD, mildly dil LA/RA, nl RV fxn.  . Chronic Dyspnea on exertion   . CKD (chronic kidney disease) stage 3, GFR 30-59 ml/min (HCC)   . Degenerative arthritis of knee    bilateral knees  . Diabetes mellitus    Type II  . GIB (gastrointestinal bleeding)    a. 08/2016 Admission w/ presyncope/anemia/melena-->req Transfusion-->endo ok, colonoscopy w/ polyps but no source of bleeding (most likely diverticular).  . Hiatal hernia   . Hypertension   . Iron deficiency   . Menopausal symptoms   . Morbid obesity ([facility]   . Renal insufficiency   . Thyroid disease    hypothyroidism     SURGICAL HISTORY   Past Surgical History:  Procedure Laterality Date  . COLONOSCOPY  2015  . COLONOSCOPY WITH PROPOFOL N/A 09/25/2016   Procedure: COLONOSCOPY WITH PROPOFOL;  Surgeon: AJonathon Bellows MD;  [facility];  Service: Gastroenterology;  Laterality: N/A;  . COLONOSCOPY WITH PROPOFOL N/A 09/26/2016   Procedure: COLONOSCOPY WITH PROPOFOL;  Surgeon: AJonathon Bellows MD;  [facility];  Service: Gastroenterology;  Laterality: N/A;  . ESOPHAGOGASTRODUODENOSCOPY (EGD)  WITH PROPOFOL N/A 09/25/2016   Procedure: ESOPHAGOGASTRODUODENOSCOPY (EGD) WITH PROPOFOL;  Surgeon: [name], MD;  Location: [facility] ENDOSCOPY;  Service: Gastroenterology;  Laterality: N/A;  . RENAL ANGIOGRAPHY N/A 02/07/2019   Procedure: RENAL ANGIOGRAPHY;  Surgeon: [name], MD;  Location: [facility];  Service: Cardiovascular;  Laterality: N/A;  . REPLACEMENT TOTAL KNEE BILATERAL    . TOTAL VAGINAL HYSTERECTOMY     ovarian mass, not cancerous  . UPPER GI ENDOSCOPY  2015     FAMILY HISTORY   Family History  Problem Relation Age of Onset  . Breast cancer Mother 41      SOCIAL HISTORY   Social History   Tobacco Use  . Smoking status: Never Smoker  . Smokeless tobacco: Never Used  Vaping Use  . Vaping Use: Never used  Substance Use Topics  . Alcohol use: No  . Drug use: No     MEDICATIONS    Home Medication:    Current Medication:  Current Facility-Administered Medications:  .  acetaminophen (TYLENOL) tablet 650 mg, 650 mg, Oral, Q6H PRN, Oswald Hillock, RPH, 650 mg at 05/26/20 1105 .  albuterol (PROVENTIL) (2.5 MG/3ML) 0.083% nebulizer solution 2.5 mg, 2.5 mg, Nebulization, Q4H PRN, Ivor Costa, MD .  amLODipine (NORVASC) tablet 5 mg, 5 mg, Oral, Daily, Leslye Peer, Richard, MD, 5 mg at 05/27/20 1007 .  atorvastatin (LIPITOR) tablet 20 mg, 20 mg, Oral, Daily, Oswald Hillock, RPH, 20 mg at 05/26/20 2042 .  bisacodyl (DULCOLAX) suppository 10 mg, 10 mg, Rectal, QHS, Wieting, Richard, MD, 10 mg at 05/25/20 2050 .  clopidogrel (PLAVIX) tablet 75 mg, 75 mg, Oral, Daily, Ivor Costa, MD, 75 mg at 05/27/20 1007 .  dextromethorphan-guaiFENesin (MUCINEX DM) 30-600 MG per 12 hr tablet 1 tablet, 1 tablet, Oral, BID, Wyvonnia Dusky, MD, 1 tablet at 05/27/20 1006 .  docusate sodium (COLACE) capsule 100 mg, 100 mg, Oral, BID PRN, Ivor Costa, MD .  enoxaparin (LOVENOX) injection 30 mg, 30 mg, Subcutaneous, Q24H, Wieting, Richard, MD, 30 mg at 05/26/20 2041 .  ipratropium-albuterol (DUONEB) 0.5-2.5 (3) MG/3ML nebulizer solution 3 mL, 3 mL, Nebulization, BID, Wyvonnia Dusky, MD, 3 mL at 05/27/20 0821 .  iron polysaccharides (NIFEREX) capsule 150 mg, 150 mg, Oral, Daily, Ivor Costa, MD, 150 mg at 05/27/20 1007 .  levETIRAcetam (KEPPRA) tablet 250 mg, 250 mg, Oral, BID, Oswald Hillock, RPH, 250 mg at 05/27/20 1007 .  levothyroxine (SYNTHROID) tablet 100 mcg, 100 mcg, Oral, Q0600, Wyvonnia Dusky, MD, 100 mcg at 05/27/20 0533 .  magnesium oxide (MAG-OX) tablet 400 mg, 400 mg, Oral, Daily, Leslye Peer, Richard, MD, 400 mg at 05/27/20 1007 .  melatonin  tablet 5 mg, 5 mg, Oral, QHS PRN, Ivor Costa, MD, 5 mg at 05/20/20 2108 .  mometasone-formoterol (DULERA) 200-5 MCG/ACT inhaler 2 puff, 2 puff, Inhalation, BID, Ivor Costa, MD, 2 puff at 05/27/20 1007 .  montelukast (SINGULAIR) tablet 10 mg, 10 mg, Oral, QHS, Ivor Costa, MD, 10 mg at 05/26/20 2042 .  nitroGLYCERIN (NITROSTAT) SL tablet 0.4 mg, 0.4 mg, Sublingual, Q5 Min x 3 PRN, Ivor Costa, MD .  ondansetron [facility]) injection 4 mg, 4 mg, Intravenous, Q8H PRN, Ivor Costa, MD .  pantoprazole (PROTONIX) EC tablet 20 mg, 20 mg, Oral, Daily, Oswald Hillock, RPH, 20 mg at 05/27/20 1007 .  piperacillin-tazobactam (ZOSYN) IVPB 3.375 g, 3.375 g, Intravenous, Q8H, Wieting, Richard, MD, Last Rate: 12.5 mL/hr at 05/27/20 0532, 3.375 g at 05/27/20 0532 .  polyethylene glycol (MIRALAX / GLYCOLAX) packet 17 g, 17 g, Oral, Daily PRN, Ivor Costa, MD .  predniSONE (DELTASONE) tablet 20 mg, 20 mg, Oral, Q breakfast, Leslye Peer, Richard, MD, 20 mg at 05/27/20 1007 .  pregabalin (LYRICA) capsule 50 mg, 50 mg, Oral, BID, Leslye Peer, Richard, MD, 50 mg at 05/27/20 1007 .  sodium chloride flush (NS) 0.9 % injection 10-40 mL, 10-40 mL, Intracatheter, Q12H, Gollan, Kathlene November, MD, 10 mL at 05/27/20 1008 .  sodium chloride flush (NS) 0.9 % injection 10-40 mL, 10-40 mL, Intracatheter, PRN, Rockey Situ, Kathlene November, MD .  sodium phosphate (FLEET) 7-19 GM/118ML enema 1 enema, 1 enema, Rectal, Daily, Loletha Grayer, MD, 1 enema at 05/25/20 0901 .  traMADol (ULTRAM) tablet 50 mg, 50 mg, Oral, Q12H PRN, Wyvonnia Dusky, MD, 50 mg at 05/27/20 1010  Facility-Administered Medications Ordered in Other Encounters:  .  0.9 %  sodium chloride infusion, , Intravenous, Continuous, Finnegan, Kathlene November, MD    ALLERGIES   Atenolol, Codeine, Losartan, Morphine and related, Fentanyl, Nsaids, Rofecoxib, Sucralfate, Sulfa antibiotics, and Tramadol     REVIEW OF SYSTEMS    Review of Systems:  Gen:  Denies  fever, sweats, chills weigh loss   HEENT: Denies blurred vision, double vision, ear pain, eye pain, hearing loss, nose bleeds, sore throat Cardiac:  No dizziness, chest pain or heaviness, chest tightness,edema Resp:   Denies cough or sputum porduction, shortness of breath,wheezing, hemoptysis,  Gi: Denies swallowing difficulty, stomach pain, nausea or vomiting, diarrhea, constipation, bowel incontinence Gu:  Denies bladder incontinence, burning urine Ext:   Denies Joint pain, stiffness or swelling Skin: Denies  skin rash, easy bruising or bleeding or hives Endoc:  Denies polyuria, polydipsia , polyphagia or weight change Psych:   Denies depression, insomnia or hallucinations   Other:  All other systems negative   VS: BP (!) 147/67   Pulse 75   Temp 97.8 F (36.6 C) (Oral)   Resp 16   Ht 5' 2"  (1.575 m)   Wt 82.9 kg   SpO2 92%   BMI 33.43 kg/m      PHYSICAL EXAM    GENERAL:NAD, no fevers, chills, no weakness no fatigue HEAD: Normocephalic, atraumatic.  EYES: Pupils equal, round, reactive to light. Extraocular muscles intact. No scleral icterus.  MOUTH: Moist mucosal membrane. Dentition intact. No abscess noted.  EAR, NOSE, THROAT: Clear without exudates. No external lesions.  NECK: Supple. No thyromegaly. No nodules. No JVD.  PULMONARY: Diffuse coarse rhonchi right sided +wheezes CARDIOVASCULAR: S1 and S2. Regular rate and rhythm. No murmurs, rubs, or gallops. No edema. Pedal pulses 2+ bilaterally.  GASTROINTESTINAL: Soft, nontender, nondistended. No masses. Positive bowel sounds. No hepatosplenomegaly.  MUSCULOSKELETAL: No swelling, clubbing, or edema. Range of motion full in all extremities.  NEUROLOGIC: Cranial nerves II through XII are intact. No gross focal neurological deficits. Sensation intact. Reflexes intact.  SKIN: No ulceration, lesions, rashes, or cyanosis. Skin warm and dry. Turgor intact.  PSYCHIATRIC: Mood, affect within normal limits. The patient is awake, alert and oriented x 3. Insight,  judgment intact.       IMAGING    CT HEAD WO CONTRAST  Result Date: 05/24/2020 CLINICAL DATA:  Mental status change EXAM: CT HEAD WITHOUT CONTRAST TECHNIQUE: Contiguous axial images were obtained from the base of the skull through the vertex without intravenous contrast. COMPARISON:  05/22/2020 FINDINGS: Brain: There is no acute intracranial hemorrhage, mass effect, or edema. No new loss of gray-white differentiation. Small chronic left cerebellar infarct. Patchy  hypoattenuation in the supratentorial white matter likely reflects stable chronic microvascular ischemic changes. There is no extra-axial fluid collection. Ventricles and sulci are stable in size and configuration. Vascular: There is atherosclerotic calcification at the skull base. Skull: Calvarium is unremarkable. Sinuses/Orbits: Mild mucosal thickening. No acute orbital abnormality. Other: None. IMPRESSION: No acute intracranial abnormality. No significant change since recent prior study. Electronically Signed   By: Macy Mis M.D.   On: 05/24/2020 17:14   DG Chest Port 1 View  Result Date: 05/27/2020 CLINICAL DATA:  Pneumonia. EXAM: PORTABLE CHEST 1 VIEW COMPARISON:  05/24/2020; 05/22/2020 FINDINGS: Grossly unchanged cardiac silhouette and mediastinal contours with persistent obscuration of the right heart border secondary to unchanged left-sided pleural effusion and associated left mid lower lung consolidative opacities, similar to the 05/22/2020 examination. Atherosclerotic plaque within the aortic arch and mitral valve calcifications. Mild pulmonary venous congestion within the otherwise well aerated right lung. No definite right-sided pleural effusion though the right costophrenic angle is excluded from view. No acute osseous abnormalities. IMPRESSION: 1. Similar appearing extensive left lung consolidative opacities and small left-sided effusion worrisome for multifocal pneumonia. 2. Suspected pulmonary venous congestion within the  otherwise well aerated right lung. Electronically Signed   By: Sandi Mariscal M.D.   On: 05/27/2020 09:30   DG Chest Port 1 View  Result Date: 05/24/2020 CLINICAL DATA:  Post left-sided thoracentesis. EXAM: PORTABLE CHEST 1 VIEW COMPARISON:  Chest radiograph-05/22/2020; 05/18/2020; chest CT-05/18/2020 FINDINGS: Grossly unchanged cardiac silhouette and mediastinal contours with atherosclerotic plaque within thoracic aorta. Dystrophic calcifications within the mitral valve annulus. Interval reduction/near resolution of left-sided pleural effusion post thoracentesis with improved aeration of the left lung apex. Redemonstrated extensive left mid and lower lung heterogeneous/consolidative opacities with associated left-sided volume loss and deviation of the cardiomediastinal structures to the left. Pulmonary vasculature within the right lung remains indistinct. No definite right-sided pleural effusion. No acute osseous abnormalities. Redemonstrated chronic displaced fracture involving the anatomic neck of the right humerus, incompletely evaluated. IMPRESSION: 1. Interval reduction/near resolution of left-sided pleural effusion post thoracentesis. No pneumothorax. 2. Otherwise, similar findings of extensive left mid and lower lung heterogeneous/consolidative opacities with associated volume loss. Electronically Signed   By: Sandi Mariscal M.D.   On: 05/24/2020 12:15   DG Chest Port 1 View  Result Date: 05/22/2020 CLINICAL DATA:  Hypoxia EXAM: PORTABLE CHEST 1 VIEW COMPARISON:  05/18/2020 FINDINGS: Large left pleural effusion with worsening left upper consolidation. Chronic right humeral head fracture. Mild right chest opacity. IMPRESSION: Large left pleural effusion with worsening left upper consolidation. Electronically Signed   By: Ulyses Jarred M.D.   On: 05/22/2020 01:44   DG Chest Port 1 View  Result Date: 05/18/2020 CLINICAL DATA:  Shortness of breath and fever EXAM: PORTABLE CHEST 1 VIEW COMPARISON:  March 06, 2020 FINDINGS: There is extensive airspace opacity throughout most of the left lung. The right lung is clear. There is cardiomegaly with mild pulmonary venous hypertension. No adenopathy. There is aortic atherosclerosis. There is calcification in each carotid artery. No bone lesions. IMPRESSION: Widespread airspace opacity throughout much of the left lung consistent with multifocal pneumonia. Right lung clear. There is cardiomegaly with a degree of pulmonary vascular congestion. There is calcification in each carotid artery. Aortic Atherosclerosis (ICD10-I70.0). Electronically Signed   By: Lowella Grip III M.D.   On: 05/18/2020 09:08   CT HEAD CODE STROKE WO CONTRAST`  Result Date: 05/22/2020 CLINICAL DATA:  Code stroke.  Acute neurologic deficit EXAM: CT HEAD WITHOUT CONTRAST TECHNIQUE:  Contiguous axial images were obtained from the base of the skull through the vertex without intravenous contrast. COMPARISON:  None. FINDINGS: Brain: There is no mass, hemorrhage or extra-axial collection. The size and configuration of the ventricles and extra-axial CSF spaces are normal. There is hypoattenuation of the periventricular white matter, most commonly indicating chronic ischemic microangiopathy. Vascular: No abnormal hyperdensity of the major intracranial arteries or dural venous sinuses. No intracranial atherosclerosis. Skull: The visualized skull base, calvarium and extracranial soft tissues are normal. Sinuses/Orbits: No fluid levels or advanced mucosal thickening of the visualized paranasal sinuses. No mastoid or middle ear effusion. The orbits are normal. ASPECTS [facility] Stroke Program Early CT Score) - Ganglionic level infarction (caudate, lentiform nuclei, internal capsule, insula, M1-M3 cortex): 7 - Supraganglionic infarction (M4-M6 cortex): 3 Total score (0-10 with 10 being normal): 10 IMPRESSION: 1. Chronic ischemic microangiopathy without acute intracranial abnormality. 2. ASPECTS is 10. These  results were called by telephone at the time of interpretation on 05/22/2020 at 12:35 am to provider [facility] , who verbally acknowledged these results. Electronically Signed   By: Ulyses Jarred M.D.   On: 05/22/2020 00:44   ECHOCARDIOGRAM LIMITED  Result Date: 05/23/2020    ECHOCARDIOGRAM LIMITED REPORT   Patient Name:   ELANE PEABODY Date of Exam: 05/23/2020 Medical Rec #:  [id number]          Height:       62.0 in Accession #:    [id number]         Weight:       181.8 lb Date of Birth:  May 18, 1927          BSA:          1.836 m Patient Age:    21 years           BP:           133/36 mmHg Patient Gender: F                  HR:           56 bpm. Exam Location:  [facility] Procedure: Limited Echo, Color Doppler and Cardiac Doppler Indications:     Chest pain R07.9                  NSTEMI I21.4  History:         Patient has prior history of Echocardiogram examinations, most                  recent 02/07/2020. Risk Factors:Hypertension and Diabetes.  Sonographer:     Sherrie Sport RDCS (AE) Referring Phys:  [id number] Arvil Chaco Diagnosing Phys: Kathlyn Sacramento MD IMPRESSIONS  1. Left ventricular ejection fraction, by estimation, is 55 to 60%. The left ventricle has normal function. Left ventricular endocardial border not optimally defined to evaluate regional wall motion. Left ventricular diastolic function could not be evaluated.  2. Right ventricular systolic function is normal. The right ventricular size is normal. Tricuspid regurgitation signal is inadequate for assessing PA pressure.  3. Left atrial size was mildly dilated.  4. Moderate pleural effusion.  5. The mitral valve is abnormal. No evidence of mitral valve regurgitation. No evidence of mitral stenosis. Moderate mitral annular calcification.  6. The aortic valve is normal in structure. Aortic valve regurgitation is not visualized. Mild aortic valve stenosis. No gradients were measured.  7. limited and challenging study. FINDINGS  Left Ventricle:  Left ventricular ejection fraction, by estimation, is 55 to  60%. The left ventricle has normal function. Left ventricular endocardial border not optimally defined to evaluate regional wall motion. The left ventricular internal cavity size was normal in size. There is no left ventricular hypertrophy. Left ventricular diastolic function could not be evaluated. Right Ventricle: The right ventricular size is normal. No increase in right ventricular wall thickness. Right ventricular systolic function is normal. Tricuspid regurgitation signal is inadequate for assessing PA pressure. Left Atrium: Left atrial size was mildly dilated. Right Atrium: Right atrial size was normal in size. Pericardium: There is no evidence of pericardial effusion. Mitral Valve: The mitral valve is abnormal. Moderate mitral annular calcification. No evidence of mitral valve stenosis. Tricuspid Valve: The tricuspid valve is normal in structure. Tricuspid valve regurgitation is not demonstrated. No evidence of tricuspid stenosis. Aortic Valve: The aortic valve is normal in structure. Aortic valve regurgitation is not visualized. Mild aortic stenosis is present. Pulmonic Valve: The pulmonic valve was normal in structure. Pulmonic valve regurgitation is not visualized. No evidence of pulmonic stenosis. Aorta: The aortic root is normal in size and structure. Venous: The inferior vena cava was not well visualized. IAS/Shunts: No atrial level shunt detected by color flow Doppler. Additional Comments: There is a moderate pleural effusion. LEFT VENTRICLE PLAX 2D LVIDd:         3.92 cm LVIDs:         2.74 cm LV PW:         1.54 cm LV IVS:        0.97 cm LVOT diam:     2.00 cm LVOT Area:     3.14 cm  LEFT ATRIUM         Index LA diam:    5.10 cm 2.78 cm/m                        PULMONIC VALVE AORTA                 PV Vmax:        1.12 m/s Ao Root diam: 2.40 cm PV Peak grad:   5.0 mmHg                       RVOT Peak grad: 12 mmHg   SHUNTS Systemic Diam:  2.00 cm Kathlyn Sacramento MD Electronically signed by Kathlyn Sacramento MD Signature Date/Time: 05/23/2020/10:19:48 AM    Final    CT CHEST ABDOMEN PELVIS WO CONTRAST  Result Date: 05/18/2020 CLINICAL DATA:  Chest pain or shortness of breath. Pleural effusion or pleurisy suspected EXAM: CT CHEST, ABDOMEN AND PELVIS WITHOUT CONTRAST TECHNIQUE: Multidetector CT imaging of the chest, abdomen and pelvis was performed following the standard protocol without IV contrast. COMPARISON:  01/13/2017 FINDINGS: CT CHEST FINDINGS Cardiovascular: Normal heart size. No pericardial effusion. Bulky atherosclerotic calcification of the aorta and great vessels. Coronary atherosclerosis. Mediastinum/Nodes: No adenopathy or mass. Moderate sliding hiatal hernia. Lungs/Pleura: Dense consolidation throughout the left lung with poor visualization of central airways. Small left pleural effusion. Musculoskeletal: Nonacute right humeral neck fracture with marked anterior displacement that is similar to initial diagnosis February 2022 by radiography. Extensive spondylosis and thoracic spine ankylosis. Multilevel degenerative facet spurring especially at the open T6-7 level where there is biforaminal impingement. CT ABDOMEN PELVIS FINDINGS Hepatobiliary: No focal liver abnormality.No evidence of biliary obstruction or stone. Pancreas: Unremarkable. Spleen: Unremarkable. Adrenals/Urinary Tract: Negative adrenals. No hydronephrosis or stone. Left upper pole renal lesion which is a cyst by ultrasound in  2020. Peripherally calcified structure in the left upper pole which was also described on prior. Unremarkable bladder. Stomach/Bowel: No obstruction. No visible bowel inflammation. Numerous distal colonic diverticula. Distal colonic stool with moderate distension of the rectum. No superimposed rectal wall thickening. Vascular/Lymphatic: No acute vascular abnormality. No mass or adenopathy. Reproductive:Hysterectomy Other: No ascites or pneumoperitoneum.  Musculoskeletal: No acute abnormalities. Severe lumbar spine degeneration which is generalized. Generalized osteopenia. Hip osteoarthritis that is particularly advanced on the right. IMPRESSION: 1. Extensive left-sided pneumonia opacifying most of the left lung. Small left parapneumonic effusion. 2. No acute intra-abdominal finding. 3. Numerous chronic/known incidental findings which are described above. Electronically Signed   By: Monte Fantasia M.D.   On: 05/18/2020 11:21   US THORACENTESIS ASP PLEURAL SPACE W/IMG GUIDE  Result Date: 05/24/2020 INDICATION: Symptomatic left sided pleural effusion. Please perform ultrasound-guided thoracentesis for diagnostic and therapeutic purposes. EXAM: US THORACENTESIS ASP PLEURAL SPACE W/IMG GUIDE COMPARISON:  Chest radiograph-05/22/2020; 05/18/2020; chest CT-05/18/2020 MEDICATIONS: None. COMPLICATIONS: None immediate. TECHNIQUE: Informed written consent was obtained from the the patient and the patient's daughter after a discussion of the risks, benefits and alternatives to treatment. A timeout was performed prior to the initiation of the procedure. Initial ultrasound scanning demonstrates a small left-sided pleural effusion with dominant component loculated regional to the left upper lobe. The posterior aspect of the left upper/mid chest was prepped and draped in the usual sterile fashion. 1% lidocaine was used for local anesthesia. Under direct ultrasound guidance, an 8 Fr Safe-T-Centesis catheter was introduced. The thoracentesis was performed. The catheter was removed and a dressing was applied. The patient tolerated the procedure well without immediate post procedural complication. The patient was escorted to have an upright chest radiograph. FINDINGS: A total of approximately 250 cc of serous fluid was removed. Requested samples were sent to the laboratory. IMPRESSION: Successful ultrasound-guided left sided thoracentesis yielding 250 cc of pleural fluid.  Electronically Signed   By: Sandi Mariscal M.D.   On: 05/24/2020 12:20      ASSESSMENT/PLAN   Acute on chronic hypoxemic respiratory failure S/p thoracentesis with negative gm stain , neutrophil predominant exudate. -patient significantly clinically improved, looks better then she did in office 4 months ago. -She has CKD and CHF with cardiology following - appreciate input -she has reactive airway disease with asthma and Obesity hypoventilation overlap and does benefit from BIPAP nocturnally. -She is with severe deconditioning which is precluding her from taking vigorous breath and she should have IS and Chest PT -Will obtain CT chest to better characterize pulmonary/respiratory status.  -Reviewed care plan with daughter and patient at bedside -Met with Attending physician and reviewed imaging and plan together.         Thank you for allowing me to participate in the care of this patient.    Patient/Family are satisfied with care plan and all questions have been answered.  This document was prepared using Dragon voice recognition software and may include unintentional dictation errors.     Ottie Glazier, M.D.  Division of [facility]

## 2020-05-28 ENCOUNTER — Inpatient Hospital Stay: Payer: Medicare Other

## 2020-05-28 DIAGNOSIS — J189 Pneumonia, unspecified organism: Secondary | ICD-10-CM | POA: Diagnosis not present

## 2020-05-28 DIAGNOSIS — R52 Pain, unspecified: Secondary | ICD-10-CM | POA: Diagnosis not present

## 2020-05-28 DIAGNOSIS — J90 Pleural effusion, not elsewhere classified: Secondary | ICD-10-CM | POA: Diagnosis not present

## 2020-05-28 DIAGNOSIS — A419 Sepsis, unspecified organism: Secondary | ICD-10-CM | POA: Diagnosis not present

## 2020-05-28 LAB — LACTATE DEHYDROGENASE, PLEURAL OR PERITONEAL FLUID: LD, Fluid: 73 U/L — ABNORMAL HIGH (ref 3–23)

## 2020-05-28 LAB — BLOOD GAS, ARTERIAL
Acid-Base Excess: 1.5 mmol/L (ref 0.0–2.0)
Bicarbonate: 26.6 mmol/L (ref 20.0–28.0)
O2 Saturation: 93.3 %
Patient temperature: 37
pCO2 arterial: 43 mmHg (ref 32.0–48.0)
pH, Arterial: 7.4 (ref 7.350–7.450)
pO2, Arterial: 68 mmHg — ABNORMAL LOW (ref 83.0–108.0)

## 2020-05-28 LAB — BASIC METABOLIC PANEL
Anion gap: 5 (ref 5–15)
BUN: 37 mg/dL — ABNORMAL HIGH (ref 8–23)
CO2: 26 mmol/L (ref 22–32)
Calcium: 8.2 mg/dL — ABNORMAL LOW (ref 8.9–10.3)
Chloride: 114 mmol/L — ABNORMAL HIGH (ref 98–111)
Creatinine, Ser: 1.3 mg/dL — ABNORMAL HIGH (ref 0.44–1.00)
GFR, Estimated: 39 mL/min — ABNORMAL LOW (ref >60)
Glucose, Bld: 119 mg/dL — ABNORMAL HIGH (ref 70–99)
Potassium: 4.3 mmol/L (ref 3.5–5.1)
Sodium: 145 mmol/L (ref 135–145)

## 2020-05-28 LAB — BODY FLUID CELL COUNT WITH DIFFERENTIAL
Eos, Fluid: 0 %
Lymphs, Fluid: 70 %
Monocyte-Macrophage-Serous Fluid: 12 %
Neutrophil Count, Fluid: 18 %
Total Nucleated Cell Count, Fluid: 22 cu mm

## 2020-05-28 LAB — CBC
HCT: 31.6 % — ABNORMAL LOW (ref 36.0–46.0)
Hemoglobin: 9.9 g/dL — ABNORMAL LOW (ref 12.0–15.0)
MCH: 29.6 pg (ref 26.0–34.0)
MCHC: 31.3 g/dL (ref 30.0–36.0)
MCV: 94.3 fL (ref 80.0–100.0)
Platelets: 263 10*3/uL (ref 150–400)
RBC: 3.35 MIL/uL — ABNORMAL LOW (ref 3.87–5.11)
RDW: 14.8 % (ref 11.5–15.5)
WBC: 11 10*3/uL — ABNORMAL HIGH (ref 4.0–10.5)
nRBC: 0 % (ref 0.0–0.2)

## 2020-05-28 LAB — MAGNESIUM: Magnesium: 1.9 mg/dL (ref 1.7–2.4)

## 2020-05-28 LAB — GLUCOSE, PLEURAL OR PERITONEAL FLUID: Glucose, Fluid: 146 mg/dL

## 2020-05-28 LAB — PROTEIN, PLEURAL OR PERITONEAL FLUID: Total protein, fluid: <3 g/dL

## 2020-05-28 MED ORDER — TORSEMIDE 20 MG PO TABS
10.0000 mg | ORAL_TABLET | Freq: Every day | ORAL | Status: DC
  Administered 2020-05-29 – 2020-05-31 (×3): 10 mg via ORAL
  Filled 2020-05-28 (×3): qty 1

## 2020-05-28 MED ORDER — CHLORHEXIDINE GLUCONATE CLOTH 2 % EX PADS
6.0000 | MEDICATED_PAD | Freq: Every day | CUTANEOUS | Status: DC
  Administered 2020-05-28 – 2020-06-02 (×6): 6 via TOPICAL

## 2020-05-28 NOTE — Procedures (Signed)
Ultrasound-guided therapeutic left sided  thoracentesis performed yielding 600 mililiters of straw  colored fluid. No immediate complications.  Patient unable to tolerate additional fluid removal at this time. Follow-up chest x-ray pending. EBL is < 35ml.

## 2020-05-28 NOTE — Care Management Important Message (Signed)
Important Message  Patient Details  Name: Amy [name] MRN: [id number] Date of Birth: 1927-06-16   Medicare Important Message Given:  Yes     Dannette Barbara 05/28/2020, 12:28 PM

## 2020-05-28 NOTE — Progress Notes (Signed)
Pulmonary Medicine          Date: 05/28/2020,   MRN# [id number] Amy [name] 04-Feb-1928     AdmissionWeight: 86.2 kg                 CurrentWeight: 81.3 kg   Referring physician: Dr Earleen Newport    CHIEF COMPLAINT:   Multifocal Pneumonia with parapneumonic effusion.   HISTORY OF PRESENT ILLNESS   As per admission h/p Patient states that she started having abdominal pain which was located in the left side of the abdomen, associated with nausea and diarrhea.  She has had 3-4 times of loose stool bowel movement.  She also had fever of 101 in facility, currently body temperature is 97.5 in ED. Patient has cough and shortness of breath which has been progressively worsening.  She was found to have oxygen desaturation to 85% on room air, which improved to 94% on 5 L oxygen.  Denies chest pain.  No rectal bleeding or dark stool.  Pt was found to have WBC 4.7, lactic acid 2.9, 1.5, INR 1.1, PTT 31, troponin level 638, 933, positive Covid PCR, renal function close to baseline, temperature 97.5, softer blood pressure 93/39, heart rate 98, RR 28.  Chest x-ray showed multifocal infiltration mainly in the left side, vascular congestion and cardiomegaly.  CT of the chest/abdomen/pelvis showed left lower lobe infiltration,  no acute issues intra-abdominally. After 9 day pneumonia tx and thoracentesis patient still has bilateral infiltrates and PCCM consultation placed for additional evaluation and management.   05/28/20- Patient is s/p thoracentesis, she feels better already. She is working with [employer] therapy and feels that after first session she is already breathing better.  She had OT today. We discussed using incentive spiroemetry.  I discussed her care plan with Dr Leslye Peer today.    PAST MEDICAL HISTORY   Past Medical History:  Diagnosis Date  . Bladder incontinence   . CAD (coronary artery disease)    a. 05/2009 Cath: LAD 90p (Xience 2.75 x 12 mm DES), 68m D1 40, LCx  40p/m, RCA 30/40/30p/m, RCA 30/25d;  b.  12/2011 Lexiscan MV: no ischemia, breast attenuation artifact, normal EF-->Low risk; c. 10/2016 MV: fixed apical defect, most likely apical thinning and attenuation, No ischemia, EF 60%.  . [place]    ov[place]  . Carotid arterial disease w/ R Carotid Bruit (HCC)    a. 08/2016 Carotid U/S: 40-59% bilat ICA stenosis - f/u 1 yr.  . Chronic diastolic (congestive) heart failure ([place]    a. Echo 09/2015: EF 55-60% w/ Grade 1 DD, sev Ca2+ MV annulus, mildly dil LA; b. 09/2016 Echo: EF 65-70%, Gr2 DD, mildly dil LA/RA, nl RV fxn.  . Chronic Dyspnea on exertion   . CKD (chronic kidney disease) stage 3, GFR 30-59 ml/min (HCC)   . Degenerative arthritis of knee    bilateral knees  . Diabetes mellitus    Type II  . GIB (gastrointestinal bleeding)    a. 08/2016 Admission w/ presyncope/anemia/melena-->req Transfusion-->endo ok, colonoscopy w/ polyps but no source of bleeding (most likely diverticular).  . Hiatal hernia   . Hypertension   . Iron deficiency   . Menopausal symptoms   . Morbid obesity ([place]   . Renal insufficiency   . Thyroid disease    hypothyroidism     SURGICAL HISTORY   Past Surgical History:  Procedure Laterality Date  . COLONOSCOPY  2015  . COLONOSCOPY WITH PROPOFOL N/A 09/25/2016   Procedure:  COLONOSCOPY WITH PROPOFOL;  Surgeon: [name], MD;  Location: [facility] ENDOSCOPY;  Service: Gastroenterology;  Laterality: N/A;  . COLONOSCOPY WITH PROPOFOL N/A 09/26/2016   Procedure: COLONOSCOPY WITH PROPOFOL;  Surgeon: [name], MD;  Location: [facility] ENDOSCOPY;  Service: Gastroenterology;  Laterality: N/A;  . ESOPHAGOGASTRODUODENOSCOPY (EGD) WITH PROPOFOL N/A 09/25/2016   Procedure: ESOPHAGOGASTRODUODENOSCOPY (EGD) WITH PROPOFOL;  Surgeon: [name], MD;  Location: [facility] ENDOSCOPY;  Service: Gastroenterology;  Laterality: N/A;  . RENAL ANGIOGRAPHY N/A 02/07/2019   Procedure: RENAL ANGIOGRAPHY;  Surgeon: [name], MD;  Location: [facility]  CV LAB;  Service: Cardiovascular;  Laterality: N/A;  . REPLACEMENT TOTAL KNEE BILATERAL    . TOTAL VAGINAL HYSTERECTOMY     ovarian mass, not cancerous  . UPPER GI ENDOSCOPY  2015     FAMILY HISTORY   Family History  Problem Relation Age of Onset  . Breast cancer Mother 67     SOCIAL HISTORY   Social History   Tobacco Use  . Smoking status: Never Smoker  . Smokeless tobacco: Never Used  Vaping Use  . Vaping Use: Never used  Substance Use Topics  . Alcohol use: No  . Drug use: No     MEDICATIONS    Home Medication:    Current Medication:  Current Facility-Administered Medications:  .  acetaminophen (TYLENOL) tablet 650 mg, 650 mg, Oral, Q6H PRN, Oswald Hillock, RPH, 650 mg at 05/26/20 1105 .  albuterol (PROVENTIL) (2.5 MG/3ML) 0.083% nebulizer solution 2.5 mg, 2.5 mg, Nebulization, Q4H PRN, Ivor Costa, MD .  atorvastatin (LIPITOR) tablet 20 mg, 20 mg, Oral, Daily, Oswald Hillock, RPH, 20 mg at 05/27/20 2155 .  bisacodyl (DULCOLAX) suppository 10 mg, 10 mg, Rectal, QHS, Wieting, Richard, MD, 10 mg at 05/25/20 2050 .  Chlorhexidine Gluconate Cloth 2 % PADS 6 each, 6 each, Topical, Daily, Loletha Grayer, MD, 6 each at 05/28/20 [phone] .  clopidogrel (PLAVIX) tablet 75 mg, 75 mg, Oral, Daily, Ivor Costa, MD, 75 mg at 05/28/20 0856 .  dextromethorphan-guaiFENesin (MUCINEX DM) 30-600 MG per 12 hr tablet 1 tablet, 1 tablet, Oral, BID, Wyvonnia Dusky, MD, 1 tablet at 05/28/20 0857 .  docusate sodium (COLACE) capsule 100 mg, 100 mg, Oral, BID PRN, Ivor Costa, MD .  enoxaparin (LOVENOX) injection 30 mg, 30 mg, Subcutaneous, Q24H, Wieting, Richard, MD, 30 mg at 05/27/20 2155 .  ipratropium-albuterol (DUONEB) 0.5-2.5 (3) MG/3ML nebulizer solution 3 mL, 3 mL, Nebulization, BID, Wyvonnia Dusky, MD, 3 mL at 05/28/20 0815 .  iron polysaccharides (NIFEREX) capsule 150 mg, 150 mg, Oral, Daily, Ivor Costa, MD, 150 mg at 05/28/20 0856 .  levETIRAcetam (KEPPRA) tablet 250 mg,  250 mg, Oral, BID, Oswald Hillock, RPH, 250 mg at 05/28/20 0857 .  levothyroxine (SYNTHROID) tablet 100 mcg, 100 mcg, Oral, Q0600, Wyvonnia Dusky, MD, 100 mcg at 05/28/20 [phone]  magnesium oxide (MAG-OX) tablet 400 mg, 400 mg, Oral, Daily, Leslye Peer, Richard, MD, 400 mg at 05/28/20 0855 .  melatonin tablet 5 mg, 5 mg, Oral, QHS PRN, Ivor Costa, MD, 5 mg at 05/20/20 2108 .  mometasone-formoterol (DULERA) 200-5 MCG/ACT inhaler 2 puff, 2 puff, Inhalation, BID, Ivor Costa, MD, 2 puff at 05/28/20 0857 .  montelukast (SINGULAIR) tablet 10 mg, 10 mg, Oral, QHS, Ivor Costa, MD, 10 mg at 05/27/20 2155 .  nitroGLYCERIN (NITROSTAT) SL tablet 0.4 mg, 0.4 mg, Sublingual, Q5 Min x 3 PRN, Ivor Costa, MD .  ondansetron [facility]) injection 4 mg, 4 mg, Intravenous, Q8H  PRN, Ivor Costa, MD .  pantoprazole (PROTONIX) EC tablet 20 mg, 20 mg, Oral, Daily, Oswald Hillock, RPH, 20 mg at 05/28/20 0857 .  polyethylene glycol (MIRALAX / GLYCOLAX) packet 17 g, 17 g, Oral, Daily PRN, Ivor Costa, MD .  predniSONE (DELTASONE) tablet 20 mg, 20 mg, Oral, Q breakfast, Leslye Peer, Richard, MD, 20 mg at 05/28/20 0855 .  pregabalin (LYRICA) capsule 50 mg, 50 mg, Oral, BID, Leslye Peer, Richard, MD, 50 mg at 05/28/20 0856 .  sodium chloride flush (NS) 0.9 % injection 10-40 mL, 10-40 mL, Intracatheter, Q12H, Gollan, Kathlene November, MD, 10 mL at 05/28/20 0858 .  sodium chloride flush (NS) 0.9 % injection 10-40 mL, 10-40 mL, Intracatheter, PRN, Rockey Situ, Kathlene November, MD .  sodium phosphate (FLEET) 7-19 GM/118ML enema 1 enema, 1 enema, Rectal, Daily, Loletha Grayer, MD, 1 enema at 05/25/20 0901 .  traMADol (ULTRAM) tablet 50 mg, 50 mg, Oral, Q12H PRN, Wyvonnia Dusky, MD, 50 mg at 05/27/20 1010  Facility-Administered Medications Ordered in Other Encounters:  .  0.9 %  sodium chloride infusion, , Intravenous, Continuous, Finnegan, Kathlene November, MD    ALLERGIES   Atenolol, Codeine, Losartan, Morphine and related, Fentanyl, Nsaids, Rofecoxib,  Sucralfate, Sulfa antibiotics, and Tramadol     REVIEW OF SYSTEMS    Review of Systems:  Gen:  Denies  fever, sweats, chills weigh loss  HEENT: Denies blurred vision, double vision, ear pain, eye pain, hearing loss, nose bleeds, sore throat Cardiac:  No dizziness, chest pain or heaviness, chest tightness,edema Resp:   Denies cough or sputum porduction, shortness of breath,wheezing, hemoptysis,  Gi: Denies swallowing difficulty, stomach pain, nausea or vomiting, diarrhea, constipation, bowel incontinence Gu:  Denies bladder incontinence, burning urine Ext:   Denies Joint pain, stiffness or swelling Skin: Denies  skin rash, easy bruising or bleeding or hives Endoc:  Denies polyuria, polydipsia , polyphagia or weight change Psych:   Denies depression, insomnia or hallucinations   Other:  All other systems negative   VS: BP (!) 157/100 (BP Location: Right Leg)   Pulse 77   Temp 97.6 F (36.4 C) (Oral)   Resp 18   Ht _0  (1.575 m)   Wt 81.3 kg   SpO2 96%   BMI 32.78 kg/m      PHYSICAL EXAM    GENERAL:NAD, no fevers, chills, no weakness no fatigue HEAD: Normocephalic, atraumatic.  EYES: Pupils equal, round, reactive to light. Extraocular muscles intact. No scleral icterus.  MOUTH: Moist mucosal membrane. Dentition intact. No abscess noted.  EAR, NOSE, THROAT: Clear without exudates. No external lesions.  NECK: Supple. No thyromegaly. No nodules. No JVD.  PULMONARY: Diffuse coarse rhonchi right sided +wheezes CARDIOVASCULAR: S1 and S2. Regular rate and rhythm. No murmurs, rubs, or gallops. No edema. Pedal pulses 2+ bilaterally.  GASTROINTESTINAL: Soft, nontender, nondistended. No masses. Positive bowel sounds. No hepatosplenomegaly.  MUSCULOSKELETAL: No swelling, clubbing, or edema. Range of motion full in all extremities.  NEUROLOGIC: Cranial nerves II through XII are intact. No gross focal neurological deficits. Sensation intact. Reflexes intact.  SKIN: No ulceration,  lesions, rashes, or cyanosis. Skin warm and dry. Turgor intact.  PSYCHIATRIC: Mood, affect within normal limits. The patient is awake, alert and oriented x 3. Insight, judgment intact.       IMAGING    CT HEAD WO CONTRAST  Result Date: 05/24/2020 CLINICAL DATA:  Mental status change EXAM: CT HEAD WITHOUT CONTRAST TECHNIQUE: Contiguous axial images were obtained from the base of the skull through the  vertex without intravenous contrast. COMPARISON:  05/22/2020 FINDINGS: Brain: There is no acute intracranial hemorrhage, mass effect, or edema. No new loss of gray-white differentiation. Small chronic left cerebellar infarct. Patchy hypoattenuation in the supratentorial white matter likely reflects stable chronic microvascular ischemic changes. There is no extra-axial fluid collection. Ventricles and sulci are stable in size and configuration. Vascular: There is atherosclerotic calcification at the skull base. Skull: Calvarium is unremarkable. Sinuses/Orbits: Mild mucosal thickening. No acute orbital abnormality. Other: None. IMPRESSION: No acute intracranial abnormality. No significant change since recent prior study. Electronically Signed   By: Macy Mis M.D.   On: 05/24/2020 17:14   CT CHEST WO CONTRAST  Result Date: 05/27/2020 CLINICAL DATA:  Pneumonia.  Abnormal chest radiograph EXAM: CT CHEST WITHOUT CONTRAST TECHNIQUE: Multidetector CT imaging of the chest was performed following the standard protocol without IV contrast. COMPARISON:  Chest CT 05/18/2020, radiograph 05/27/2020 FINDINGS: Cardiovascular: Valvular calcification. Coronary artery calcification and aortic atherosclerotic calcification. Mediastinum/Nodes: 11 mm RIGHT lower paratracheal lymph node. Lungs/Pleura: Large volume LEFT pleural effusion occupying the near entirety of the LEFT hemithorax. There is passive atelectasis with complete atelectasis of the LEFT lower lobe and lingula. Small volume of LEFT upper lobe is aerated. Small  moderate RIGHT pleural effusion with mild passive atelectasis. Upper Abdomen: Limited view of the liver, kidneys, pancreas are unremarkable. Normal adrenal glands. Musculoskeletal: No aggressive osseous lesion. Degenerative osteophytosis of the spine. IMPRESSION: 1. Progressive large LEFT pleural effusion now occupies the near entirety of the LEFT hemithorax. Complete passive atelectasis of the LEFT lower lobe and lingula. Minimal aeration of the LEFT upper lobe. 2. Small to moderate RIGHT pleural effusion. 3. Coronary artery calcification and Aortic Atherosclerosis (ICD10-I70.0). Electronically Signed   By: Suzy Bouchard M.D.   On: 05/27/2020 18:06   DG Chest Port 1 View  Result Date: 05/27/2020 CLINICAL DATA:  Pneumonia. EXAM: PORTABLE CHEST 1 VIEW COMPARISON:  05/24/2020; 05/22/2020 FINDINGS: Grossly unchanged cardiac silhouette and mediastinal contours with persistent obscuration of the right heart border secondary to unchanged left-sided pleural effusion and associated left mid lower lung consolidative opacities, similar to the 05/22/2020 examination. Atherosclerotic plaque within the aortic arch and mitral valve calcifications. Mild pulmonary venous congestion within the otherwise well aerated right lung. No definite right-sided pleural effusion though the right costophrenic angle is excluded from view. No acute osseous abnormalities. IMPRESSION: 1. Similar appearing extensive left lung consolidative opacities and small left-sided effusion worrisome for multifocal pneumonia. 2. Suspected pulmonary venous congestion within the otherwise well aerated right lung. Electronically Signed   By: Sandi Mariscal M.D.   On: 05/27/2020 09:30   DG Chest Port 1 View  Result Date: 05/24/2020 CLINICAL DATA:  Post left-sided thoracentesis. EXAM: PORTABLE CHEST 1 VIEW COMPARISON:  Chest radiograph-05/22/2020; 05/18/2020; chest CT-05/18/2020 FINDINGS: Grossly unchanged cardiac silhouette and mediastinal contours with  atherosclerotic plaque within thoracic aorta. Dystrophic calcifications within the mitral valve annulus. Interval reduction/near resolution of left-sided pleural effusion post thoracentesis with improved aeration of the left lung apex. Redemonstrated extensive left mid and lower lung heterogeneous/consolidative opacities with associated left-sided volume loss and deviation of the cardiomediastinal structures to the left. Pulmonary vasculature within the right lung remains indistinct. No definite right-sided pleural effusion. No acute osseous abnormalities. Redemonstrated chronic displaced fracture involving the anatomic neck of the right humerus, incompletely evaluated. IMPRESSION: 1. Interval reduction/near resolution of left-sided pleural effusion post thoracentesis. No pneumothorax. 2. Otherwise, similar findings of extensive left mid and lower lung heterogeneous/consolidative opacities with associated volume loss. Electronically Signed  By: Sandi Mariscal M.D.   On: 05/24/2020 12:15   DG Chest Port 1 View  Result Date: 05/22/2020 CLINICAL DATA:  Hypoxia EXAM: PORTABLE CHEST 1 VIEW COMPARISON:  05/18/2020 FINDINGS: Large left pleural effusion with worsening left upper consolidation. Chronic right humeral head fracture. Mild right chest opacity. IMPRESSION: Large left pleural effusion with worsening left upper consolidation. Electronically Signed   By: Ulyses Jarred M.D.   On: 05/22/2020 01:44   DG Chest Port 1 View  Result Date: 05/18/2020 CLINICAL DATA:  Shortness of breath and fever EXAM: PORTABLE CHEST 1 VIEW COMPARISON:  March 06, 2020 FINDINGS: There is extensive airspace opacity throughout most of the left lung. The right lung is clear. There is cardiomegaly with mild pulmonary venous hypertension. No adenopathy. There is aortic atherosclerosis. There is calcification in each carotid artery. No bone lesions. IMPRESSION: Widespread airspace opacity throughout much of the left lung consistent with  multifocal pneumonia. Right lung clear. There is cardiomegaly with a degree of pulmonary vascular congestion. There is calcification in each carotid artery. Aortic Atherosclerosis (ICD10-I70.0). Electronically Signed   By: Lowella Grip III M.D.   On: 05/18/2020 09:08   CT HEAD CODE STROKE WO CONTRAST`  Result Date: 05/22/2020 CLINICAL DATA:  Code stroke.  Acute neurologic deficit EXAM: CT HEAD WITHOUT CONTRAST TECHNIQUE: Contiguous axial images were obtained from the base of the skull through the vertex without intravenous contrast. COMPARISON:  None. FINDINGS: Brain: There is no mass, hemorrhage or extra-axial collection. The size and configuration of the ventricles and extra-axial CSF spaces are normal. There is hypoattenuation of the periventricular white matter, most commonly indicating chronic ischemic microangiopathy. Vascular: No abnormal hyperdensity of the major intracranial arteries or dural venous sinuses. No intracranial atherosclerosis. Skull: The visualized skull base, calvarium and extracranial soft tissues are normal. Sinuses/Orbits: No fluid levels or advanced mucosal thickening of the visualized paranasal sinuses. No mastoid or middle ear effusion. The orbits are normal. ASPECTS [facility] Stroke Program Early CT Score) - Ganglionic level infarction (caudate, lentiform nuclei, internal capsule, insula, M1-M3 cortex): 7 - Supraganglionic infarction (M4-M6 cortex): 3 Total score (0-10 with 10 being normal): 10 IMPRESSION: 1. Chronic ischemic microangiopathy without acute intracranial abnormality. 2. ASPECTS is 10. These results were called by telephone at the time of interpretation on 05/22/2020 at 12:35 am to provider [facility] , who verbally acknowledged these results. Electronically Signed   By: Ulyses Jarred M.D.   On: 05/22/2020 00:44   ECHOCARDIOGRAM LIMITED  Result Date: 05/23/2020    ECHOCARDIOGRAM LIMITED REPORT   Patient Name:   ADVIKA MCLELLAND Date of Exam: 05/23/2020  Medical Rec #:  [id number]          Height:       62.0 in Accession #:    [id number]         Weight:       181.8 lb Date of Birth:  1927/05/12          BSA:          1.836 m Patient Age:    87 years           BP:           133/36 mmHg Patient Gender: F                  HR:           56 bpm. Exam Location:  [facility] Procedure: Limited Echo, Color Doppler and Cardiac Doppler Indications:  Chest pain R07.9                  NSTEMI I21.4  History:         Patient has prior history of Echocardiogram examinations, most                  recent 02/07/2020. Risk Factors:Hypertension and Diabetes.  Sonographer:     Sherrie Sport RDCS (AE) Referring Phys:  [id number] Arvil Chaco Diagnosing Phys: Kathlyn Sacramento MD IMPRESSIONS  1. Left ventricular ejection fraction, by estimation, is 55 to 60%. The left ventricle has normal function. Left ventricular endocardial border not optimally defined to evaluate regional wall motion. Left ventricular diastolic function could not be evaluated.  2. Right ventricular systolic function is normal. The right ventricular size is normal. Tricuspid regurgitation signal is inadequate for assessing PA pressure.  3. Left atrial size was mildly dilated.  4. Moderate pleural effusion.  5. The mitral valve is abnormal. No evidence of mitral valve regurgitation. No evidence of mitral stenosis. Moderate mitral annular calcification.  6. The aortic valve is normal in structure. Aortic valve regurgitation is not visualized. Mild aortic valve stenosis. No gradients were measured.  7. limited and challenging study. FINDINGS  Left Ventricle: Left ventricular ejection fraction, by estimation, is 55 to 60%. The left ventricle has normal function. Left ventricular endocardial border not optimally defined to evaluate regional wall motion. The left ventricular internal cavity size was normal in size. There is no left ventricular hypertrophy. Left ventricular diastolic function could not be evaluated. Right Ventricle:  The right ventricular size is normal. No increase in right ventricular wall thickness. Right ventricular systolic function is normal. Tricuspid regurgitation signal is inadequate for assessing PA pressure. Left Atrium: Left atrial size was mildly dilated. Right Atrium: Right atrial size was normal in size. Pericardium: There is no evidence of pericardial effusion. Mitral Valve: The mitral valve is abnormal. Moderate mitral annular calcification. No evidence of mitral valve stenosis. Tricuspid Valve: The tricuspid valve is normal in structure. Tricuspid valve regurgitation is not demonstrated. No evidence of tricuspid stenosis. Aortic Valve: The aortic valve is normal in structure. Aortic valve regurgitation is not visualized. Mild aortic stenosis is present. Pulmonic Valve: The pulmonic valve was normal in structure. Pulmonic valve regurgitation is not visualized. No evidence of pulmonic stenosis. Aorta: The aortic root is normal in size and structure. Venous: The inferior vena cava was not well visualized. IAS/Shunts: No atrial level shunt detected by color flow Doppler. Additional Comments: There is a moderate pleural effusion. LEFT VENTRICLE PLAX 2D LVIDd:         3.92 cm LVIDs:         2.74 cm LV PW:         1.54 cm LV IVS:        0.97 cm LVOT diam:     2.00 cm LVOT Area:     3.14 cm  LEFT ATRIUM         Index LA diam:    5.10 cm 2.78 cm/m                        PULMONIC VALVE AORTA                 PV Vmax:        1.12 m/s Ao Root diam: 2.40 cm PV Peak grad:   5.0 mmHg  RVOT Peak grad: 12 mmHg   SHUNTS Systemic Diam: 2.00 cm Kathlyn Sacramento MD Electronically signed by Kathlyn Sacramento MD Signature Date/Time: 05/23/2020/10:19:48 AM    Final    CT CHEST ABDOMEN PELVIS WO CONTRAST  Result Date: 05/18/2020 CLINICAL DATA:  Chest pain or shortness of breath. Pleural effusion or pleurisy suspected EXAM: CT CHEST, ABDOMEN AND PELVIS WITHOUT CONTRAST TECHNIQUE: Multidetector CT imaging of the  chest, abdomen and pelvis was performed following the standard protocol without IV contrast. COMPARISON:  01/13/2017 FINDINGS: CT CHEST FINDINGS Cardiovascular: Normal heart size. No pericardial effusion. Bulky atherosclerotic calcification of the aorta and great vessels. Coronary atherosclerosis. Mediastinum/Nodes: No adenopathy or mass. Moderate sliding hiatal hernia. Lungs/Pleura: Dense consolidation throughout the left lung with poor visualization of central airways. Small left pleural effusion. Musculoskeletal: Nonacute right humeral neck fracture with marked anterior displacement that is similar to initial diagnosis February 2022 by radiography. Extensive spondylosis and thoracic spine ankylosis. Multilevel degenerative facet spurring especially at the open T6-7 level where there is biforaminal impingement. CT ABDOMEN PELVIS FINDINGS Hepatobiliary: No focal liver abnormality.No evidence of biliary obstruction or stone. Pancreas: Unremarkable. Spleen: Unremarkable. Adrenals/Urinary Tract: Negative adrenals. No hydronephrosis or stone. Left upper pole renal lesion which is a cyst by ultrasound in 2020. Peripherally calcified structure in the left upper pole which was also described on prior. Unremarkable bladder. Stomach/Bowel: No obstruction. No visible bowel inflammation. Numerous distal colonic diverticula. Distal colonic stool with moderate distension of the rectum. No superimposed rectal wall thickening. Vascular/Lymphatic: No acute vascular abnormality. No mass or adenopathy. Reproductive:Hysterectomy Other: No ascites or pneumoperitoneum. Musculoskeletal: No acute abnormalities. Severe lumbar spine degeneration which is generalized. Generalized osteopenia. Hip osteoarthritis that is particularly advanced on the right. IMPRESSION: 1. Extensive left-sided pneumonia opacifying most of the left lung. Small left parapneumonic effusion. 2. No acute intra-abdominal finding. 3. Numerous chronic/known incidental  findings which are described above. Electronically Signed   By: Monte Fantasia M.D.   On: 05/18/2020 11:21   US THORACENTESIS ASP PLEURAL SPACE W/IMG GUIDE  Result Date: 05/24/2020 INDICATION: Symptomatic left sided pleural effusion. Please perform ultrasound-guided thoracentesis for diagnostic and therapeutic purposes. EXAM: US THORACENTESIS ASP PLEURAL SPACE W/IMG GUIDE COMPARISON:  Chest radiograph-05/22/2020; 05/18/2020; chest CT-05/18/2020 MEDICATIONS: None. COMPLICATIONS: None immediate. TECHNIQUE: Informed written consent was obtained from the the patient and the patient's daughter after a discussion of the risks, benefits and alternatives to treatment. A timeout was performed prior to the initiation of the procedure. Initial ultrasound scanning demonstrates a small left-sided pleural effusion with dominant component loculated regional to the left upper lobe. The posterior aspect of the left upper/mid chest was prepped and draped in the usual sterile fashion. 1% lidocaine was used for local anesthesia. Under direct ultrasound guidance, an 8 Fr Safe-T-Centesis catheter was introduced. The thoracentesis was performed. The catheter was removed and a dressing was applied. The patient tolerated the procedure well without immediate post procedural complication. The patient was escorted to have an upright chest radiograph. FINDINGS: A total of approximately 250 cc of serous fluid was removed. Requested samples were sent to the laboratory. IMPRESSION: Successful ultrasound-guided left sided thoracentesis yielding 250 cc of pleural fluid. Electronically Signed   By: Sandi Mariscal M.D.   On: 05/24/2020 12:20      ASSESSMENT/PLAN   Acute on chronic hypoxemic respiratory failure S/p thoracentesis with negative gm stain , neutrophil predominant exudate. -patient significantly clinically improved, looks better then she did in office 4 months ago. -She has CKD and CHF with cardiology  following - appreciate  input -she has reactive airway disease with asthma and Obesity hypoventilation overlap and does benefit from BIPAP nocturnally. -She is with severe deconditioning which is precluding her from taking vigorous breath and she should have IS and Chest PT -Will obtain CT chest to better characterize pulmonary/respiratory status.  -Reviewed care plan with daughter and patient at bedside -Met with Attending physician and reviewed imaging and plan together.  -repeat thoracentesis.        Thank you for allowing me to participate in the care of this patient.    Patient/Family are satisfied with care plan and all questions have been answered.  This document was prepared using Dragon voice recognition software and may include unintentional dictation errors.     Ottie Glazier, M.D.  Division [facility]

## 2020-05-28 NOTE — Progress Notes (Signed)
Patient ID: Amy [name], female   DOB: Jul 03, 1927, 87 y.o.   MRN: [id number] [facility] PROGRESS NOTE  Shae Hinnenkamp Scheiderer RFF:[id number] DOB: 09/03/1927 DOA: 05/18/2020 PCP: Alvester Morin, MD  HPI/Subjective: The patient feels dizzy with sitting up today.  States her breathing is not the greatest.  Some shortness of breath.  Her feet feel little bit better today.  Admitted on 325 with shortness of breath and found to have multifocal pneumonia  Objective: Vitals:   05/28/20 1108 05/28/20 1134  BP: (!) 186/59 140/62  Pulse:  70  Resp:  16  Temp:  98.3 F (36.8 C)  SpO2: 94% 95%    Intake/Output Summary (Last 24 hours) at 05/28/2020 1253 Last data filed at 05/28/2020 0945 Gross per 24 hour  Intake 480 ml  Output 150 ml  Net 330 ml   Filed Weights   05/26/20 0625 05/27/20 0500 05/28/20 0424  Weight: 82.8 kg 82.9 kg 81.3 kg    ROS: Review of Systems  Respiratory: Positive for cough and shortness of breath.   Cardiovascular: Negative for chest pain.  Gastrointestinal: Negative for abdominal pain, nausea and vomiting.   Exam: Physical Exam HENT:     Head: Normocephalic.     Mouth/Throat:     Pharynx: No oropharyngeal exudate.  Eyes:     General: Lids are normal.     Conjunctiva/sclera: Conjunctivae normal.     Pupils: Pupils are equal, round, and reactive to light.  Cardiovascular:     Rate and Rhythm: Normal rate and regular rhythm.     Heart sounds: Normal heart sounds, S1 normal and S2 normal.  Pulmonary:     Breath sounds: Examination of the left-middle field reveals decreased breath sounds and rhonchi. Examination of the right-lower field reveals decreased breath sounds and rhonchi. Examination of the left-lower field reveals decreased breath sounds and rhonchi. Decreased breath sounds and rhonchi present. No wheezing or rales.  Abdominal:     Palpations: Abdomen is soft.     Tenderness: There is no abdominal tenderness.  Musculoskeletal:      Right ankle: Swelling present.     Left ankle: Swelling present.  Skin:    General: Skin is warm.     Findings: No rash.  Neurological:     Mental Status: She is alert.     Comments: Answers questions appropriately.       Data Reviewed: Basic Metabolic Panel: Recent Labs  Lab 05/24/20 0555 05/25/20 0731 05/26/20 0432 05/27/20 0543 05/28/20 0646  NA 143 141 143 144 145  K 4.4 4.5 4.2 4.2 4.3  CL 111 112* 113* 113* 114*  CO2 22 22 26 24 26   GLUCOSE 97 112* 126* 138* 119*  BUN 43* 42* 41* 35* 37*  CREATININE 1.84* 1.74* 1.49* 1.39* 1.30*  CALCIUM 8.4* 8.3* 8.2* 8.2* 8.2*  MG 1.8 1.9 1.8 1.8 1.9   Liver Function Tests: Recent Labs  Lab 05/22/20 0244  AST 12*  ALT 8  ALKPHOS 64  BILITOT 0.6  PROT 4.7*  ALBUMIN 2.2*   CBC: Recent Labs  Lab 05/24/20 0555 05/25/20 0600 05/26/20 0432 05/27/20 0543 05/28/20 0646  WBC 15.2* 13.3* 13.9* 12.3* 11.0*  HGB 8.5* 8.8* 9.0* 9.6* 9.9*  HCT 27.6* 27.9* 28.8* 30.3* 31.6*  MCV 95.8 94.9 95.0 93.8 94.3  PLT 205 190 219 235 263   BNP (last 3 results) Recent Labs    04/23/20 0530 05/18/20 1110 05/22/20 0052  BNP 370.5* 748.3* 939.2*     CBG:  Recent Labs  Lab 05/21/20 2354 05/23/20 0751  GLUCAP 115* 78    Recent Results (from the past 240 hour(s))  MRSA PCR Screening     Status: None   Collection Time: 05/18/20  3:48 PM   Specimen: Nasal Mucosa; Nasopharyngeal  Result Value Ref Range Status   MRSA by PCR NEGATIVE NEGATIVE Final    Comment:        The GeneXpert MRSA Assay (FDA approved for NASAL specimens only), is one component of a comprehensive MRSA colonization surveillance program. It is not intended to diagnose MRSA infection nor to guide or monitor treatment for MRSA infections. Performed at [facility], [address] [place], [place] [address]   MRSA PCR Screening     Status: None   Collection Time: 05/23/20  8:16 AM   Specimen: Nasal Mucosa; Nasopharyngeal  Result Value Ref  Range Status   MRSA by PCR NEGATIVE NEGATIVE Final    Comment:        The GeneXpert MRSA Assay (FDA approved for NASAL specimens only), is one component of a comprehensive MRSA colonization surveillance program. It is not intended to diagnose MRSA infection nor to guide or monitor treatment for MRSA infections. Performed at [facility], [address] [place], [place] [address]   Body fluid culture w Gram Stain     Status: None   Collection Time: 05/24/20 11:58 AM   Specimen: PATH Cytology Pleural fluid  Result Value Ref Range Status   Specimen Description   Final    PLEURAL Performed at [facility], [address] [place], [place] [address]    Special Requests   Final    NONE Performed at [facility], [address] [place], [place] [address]    Gram Stain   Final    FEW WBC PRESENT, PREDOMINANTLY MONONUCLEAR NO ORGANISMS SEEN    Culture   Final    NO GROWTH 3 DAYS Performed at [facility], [place] [address], [place], [place] [address]    Report Status 05/27/2020 FINAL  Final     Studies: CT CHEST WO CONTRAST  Result Date: 05/27/2020 CLINICAL DATA:  Pneumonia.  Abnormal chest radiograph EXAM: CT CHEST WITHOUT CONTRAST TECHNIQUE: Multidetector CT imaging of the chest was performed following the standard protocol without IV contrast. COMPARISON:  Chest CT 05/18/2020, radiograph 05/27/2020 FINDINGS: Cardiovascular: Valvular calcification. Coronary artery calcification and aortic atherosclerotic calcification. Mediastinum/Nodes: 11 mm RIGHT lower paratracheal lymph node. Lungs/Pleura: Large volume LEFT pleural effusion occupying the near entirety of the LEFT hemithorax. There is passive atelectasis with complete atelectasis of the LEFT lower lobe and lingula. Small volume of LEFT upper lobe is aerated. Small moderate RIGHT pleural effusion with mild passive atelectasis. Upper Abdomen: Limited view of the liver, kidneys, pancreas are  unremarkable. Normal adrenal glands. Musculoskeletal: No aggressive osseous lesion. Degenerative osteophytosis of the spine. IMPRESSION: 1. Progressive large LEFT pleural effusion now occupies the near entirety of the LEFT hemithorax. Complete passive atelectasis of the LEFT lower lobe and lingula. Minimal aeration of the LEFT upper lobe. 2. Small to moderate RIGHT pleural effusion. 3. Coronary artery calcification and Aortic Atherosclerosis (ICD10-I70.0). Electronically Signed   By: Suzy Bouchard M.D.   On: 05/27/2020 18:06   DG Chest Port 1 View  Result Date: 05/28/2020 CLINICAL DATA:  Status post left-sided thoracentesis EXAM: PORTABLE CHEST 1 VIEW COMPARISON:  CT chest and chest x-ray obtained yesterday FINDINGS: No evidence of pneumothorax following left-sided thoracentesis. Slightly improved aeration of  the left upper lung. Persistent dense opacification of the left mid and lower chest which may represent persistent pleural fluid versus persistent atelectasis. The right lung remains clear. Chronic right proximal humeral fracture again noted. No acute osseous abnormality. Atherosclerotic calcifications present in the transverse aorta. IMPRESSION: 1. No evidence of pneumothorax following left-sided thoracentesis. 2. Improved aeration of the left upper lung but persistent opacification of the left mid and lower lung. Findings may represent residual atelectasis versus residual pleural fluid. Electronically Signed   By: Jacqulynn Cadet M.D.   On: 05/28/2020 11:24   DG Chest Port 1 View  Result Date: 05/27/2020 CLINICAL DATA:  Pneumonia. EXAM: PORTABLE CHEST 1 VIEW COMPARISON:  05/24/2020; 05/22/2020 FINDINGS: Grossly unchanged cardiac silhouette and mediastinal contours with persistent obscuration of the right heart border secondary to unchanged left-sided pleural effusion and associated left mid lower lung consolidative opacities, similar to the 05/22/2020 examination. Atherosclerotic plaque within the  aortic arch and mitral valve calcifications. Mild pulmonary venous congestion within the otherwise well aerated right lung. No definite right-sided pleural effusion though the right costophrenic angle is excluded from view. No acute osseous abnormalities. IMPRESSION: 1. Similar appearing extensive left lung consolidative opacities and small left-sided effusion worrisome for multifocal pneumonia. 2. Suspected pulmonary venous congestion within the otherwise well aerated right lung. Electronically Signed   By: Sandi Mariscal M.D.   On: 05/27/2020 09:30   US THORACENTESIS ASP PLEURAL SPACE W/IMG GUIDE  Result Date: 05/28/2020 INDICATION: Patient with history of multifocal pneumonia with left sided pleural effusion. Request is for therapeutic and diagnostic left-sided thoracentesis EXAM: ULTRASOUND GUIDED THERAPEUTIC AND DIAGNOSTIC THORACENTESIS MEDICATIONS: Lidocaine 1% 10 mL COMPLICATIONS: None immediate. PROCEDURE: An ultrasound guided thoracentesis was thoroughly discussed with the patient and questions answered. The benefits, risks, alternatives and complications were also discussed. The patient understands and wishes to proceed with the procedure. Written consent was obtained. Ultrasound was performed to localize and mark an adequate pocket of fluid in the left chest. The area was then prepped and draped in the normal sterile fashion. 1% Lidocaine was used for local anesthesia. Under ultrasound guidance a 6 Fr Safe-T-Centesis catheter was introduced. Thoracentesis was performed. The catheter was removed and a dressing applied. FINDINGS: A total of approximately 600 mL of straw-colored fluid was removed. Samples were sent to the laboratory as requested by the clinical team. IMPRESSION: Successful ultrasound guided therapeutic and diagnostic left-sided thoracentesis yielding 600 mL of pleural fluid. Read by: Rushie Nyhan, NP Electronically Signed   By: Corrie Mckusick D.O.   On: 05/28/2020 11:33    Scheduled  Meds: . atorvastatin  20 mg Oral Daily  . bisacodyl  10 mg Rectal QHS  . Chlorhexidine Gluconate Cloth  6 each Topical Daily  . clopidogrel  75 mg Oral Daily  . dextromethorphan-guaiFENesin  1 tablet Oral BID  . enoxaparin (LOVENOX) injection  30 mg Subcutaneous Q24H  . ipratropium-albuterol  3 mL Nebulization BID  . iron polysaccharides  150 mg Oral Daily  . levETIRAcetam  250 mg Oral BID  . levothyroxine  100 mcg Oral Q0600  . magnesium oxide  400 mg Oral Daily  . mometasone-formoterol  2 puff Inhalation BID  . montelukast  10 mg Oral QHS  . pantoprazole  20 mg Oral Daily  . predniSONE  20 mg Oral Q breakfast  . pregabalin  50 mg Oral BID  . sodium chloride flush  10-40 mL Intracatheter Q12H  . sodium phosphate  1 enema Rectal Daily  . [START ON  05/29/2020] torsemide  10 mg Oral Daily   Assessment/Plan:  1. Severe sepsis, present on admission with multifocal pneumonia and left pleural effusion.  The patient had a thoracentesis on 05/24/2020 removing 250 mL of fluid.  Cultures are negative from the thoracentesis.  Patient received 10 days of antibiotic.  CT scan done yesterday shows large pleural effusion.  Repeat thoracentesis ordered for today which removed 600 mL of fluid.  Follow-up cultures and cytology.  We will start low-dose torsemide back tomorrow. 2. Pain bilateral feet ankles and legs.  Improved today on Lyrica 50 mg twice a day.  Patient on low-dose prednisone. 3. Acute metabolic encephalopathy.  Improved from 4 days ago.  Repeat CT scan of the head negative. 4. NSTEMI.  Medical management as per cardiology with Plavix and atorvastatin. 5. Acute hypoxic respiratory failure.  Did have a pulse ox of 85% on room air the other day.  Currently off oxygen during the day.  Pulmonary did recommend BiPAP at night.  With ABG the patient did not qualify.  Spirometry ordered by pulmonary. 6. Acute kidney injury on chronic kidney disease stage IIIb.  Creatinine peaked at 1.84 and is down to  1.3 today 7. Essential hypertension.  Holding amlodipine today.  Check orthostatic vital signs.  Hopefully can restart torsemide tomorrow. 8. Paroxysmal atrial fibrillation.  Not on any anticoagulation secondary to GI bleed. 9. History of seizure.  Continue Keppra 10. Iron deficiency anemia on oral iron supplementation 11. Weakness.  Physical therapy recommending rehab.        Code Status:     Code Status Orders  (From admission, onward)         Start     Ordered   05/18/20 1439  Do not attempt resuscitation (DNR)  Continuous       Question Answer Comment  In the event of cardiac or respiratory ARREST Do not call a "code blue"   In the event of cardiac or respiratory ARREST Do not perform Intubation, CPR, defibrillation or ACLS   In the event of cardiac or respiratory ARREST Use medication by any route, position, wound care, and other measures to relive pain and suffering. May use oxygen, suction and manual treatment of airway obstruction as needed for comfort.      05/18/20 1439        Code Status History    Date Active Date Inactive Code Status Order ID Comments User Context   04/23/2020 1851 04/24/2020 2335 DNR [id number]  Ivor Costa, MD Inpatient   03/06/2020 1332 03/08/2020 2259 DNR [id number]  Gwynne Edinger, MD ED   02/02/2020 2244 02/14/2020 1927 DNR [id number]  Rhetta Mura, DO ED   02/02/2020 1745 02/02/2020 2244 DNR [id number]  Rhetta Mura, DO ED   08/18/2019 1932 08/23/2019 2056 DNR [id number]  Athena Masse, MD ED   08/12/2019 1610 08/15/2019 1935 DNR [id number]  Ivor Costa, MD ED   08/12/2019 1447 08/12/2019 1610 DNR [id number]  Merlyn Lot, MD ED   08/06/2019 1455 08/07/2019 1551 DNR [id number]  Max Sane, MD ED   05/16/2017 0217 05/20/2017 1758 DNR [id number]  Lance Coon, MD Inpatient   09/21/2016 1245 09/26/2016 1836 DNR [id number]  [name], MD ED   10/01/2015 1144 10/02/2015 0748 Full Code [id number]  Bettey Costa, MD Inpatient   Advance Care Planning  Activity     Family Communication: Spoke with daughter on the phone Disposition Plan: Status is: Inpatient  Dispo: The patient is from: Long-term care  Anticipated d/c is to: Long-term care              Patient currently just received second thoracentesis today removing 600 mL   Difficult to place patient. No.  Time spent: 28 minutes  [place]

## 2020-05-28 NOTE — Plan of Care (Signed)
  Problem: Clinical Measurements: Goal: Cardiovascular complication will be avoided Outcome: Progressing   Problem: Activity: Goal: Risk for activity intolerance will decrease Outcome: Progressing   Problem: Elimination: Goal: Will not experience complications related to bowel motility Outcome: Progressing

## 2020-05-28 NOTE — Progress Notes (Signed)
FVC 0.74    FEV1 0.60    FEV1/FVC 81.7

## 2020-05-28 NOTE — Progress Notes (Signed)
Occupational Therapy Treatment Patient Details Name: Amy [name] MRN: [id number] DOB: [birth year] Today's Date: 05/28/2020    History of present illness Pt is a 87 yo female that presented to hspital due to abdominal pain, SOB, fever. Workup showed PNA, sepsis, elevated troponin (per cardiology demand ischemia); pt placed on heparin drip this admission. PMH: CAD, Cancer, dCHF c LVEF 60-65%, hypoTSH, stg-IV CKD, anemia of chronic disease, asthma, GERD, bilat knee OA, covid in January of 2022, has tested positive in Feb and March, R humeral head fx with sling.   OT comments  Amy [name] was seen for OT treatment on this date. Upon arrival to room pt seated EOB upon arrival completing lunch - pt propped against 3 pillows and feet not touching floor, daughter at bedside. Pt requires SBA + setup for self-feeding seated EOB. MAX A don B socks and R sling seated EOB, pt verbalizes RUW NWBing pcns. Pt requesting to sit up in chair, MOD A for SPT bed<chair. Upon sitting in chair MD enters room and pt requests OT to terminate session. Chair alarm set and family in room, RN notified pt in chair. Pt making good progress toward goals. Pt continues to benefit from skilled OT services to maximize return to PLOF and minimize risk of future falls, injury, caregiver burden, and readmission. Will continue to follow POC. Discharge recommendation remains appropriate.    Follow Up Recommendations  SNF    Equipment Recommendations  Other (comment) (defer to next venue of care)    Recommendations for Other Services      Precautions / Restrictions Precautions Precautions: Fall;Shoulder Shoulder Interventions: Shoulder sling/immobilizer Restrictions Weight Bearing Restrictions: Yes RUE Weight Bearing: Non weight bearing       Mobility Bed Mobility               General bed mobility comments: pt seated EOB upon arrival - propped against 3 pillows and feet not touching floor, daughter at bedside     Transfers Overall transfer level: Needs assistance Equipment used: None Transfers: Stand Pivot Transfers   Stand pivot transfers: Mod assist            Balance Overall balance assessment: Needs assistance Sitting-balance support: Feet supported Sitting balance-Leahy Scale: Fair     Standing balance support: Single extremity supported;During functional activity Standing balance-Leahy Scale: Poor                             ADL either performed or assessed with clinical judgement   ADL Overall ADL's : Needs assistance/impaired                                       General ADL Comments: SBA + setup self-feeding seated EOB - propper on 3 pillows and posterior lean noted. MOD A for ADL t/f. MAX A don B socks and R sling seated EOB.               Cognition Arousal/Alertness: Awake/alert Behavior During Therapy: WFL for tasks assessed/performed Overall Cognitive Status: Within Functional Limits for tasks assessed                                 General Comments: pt recalls R shoulder precautions        Exercises Exercises: Other exercises Other Exercises Other  Exercises: Pt and daughter educated re: OT role, d/c recs, funcitonal application of shoulder NWBing pcns, falls prevention Other Exercises: LBD, UBD, SPT, sitting/standing balnace/tolerance           Pertinent Vitals/ Pain       Pain Assessment: No/denies pain         Frequency  Min 1X/week        Progress Toward Goals  OT Goals(current goals can now be found in the care plan section)  Progress towards OT goals: Progressing toward goals  Acute Rehab OT Goals Patient Stated Goal: to go home OT Goal Formulation: With patient Time For Goal Achievement: 06/04/20 Potential to Achieve Goals: Fair ADL Goals Pt Will Perform Grooming: with min guard assist;standing Pt Will Transfer to Toilet: with supervision;stand pivot transfer;bedside commode Pt Will  Perform Toileting - Clothing Manipulation and hygiene: with min guard assist;sit to/from stand  Plan Discharge plan remains appropriate;Frequency remains appropriate       AM-PAC OT "6 Clicks" Daily Activity     Outcome Measure   Help from another person eating meals?: None Help from another person taking care of personal grooming?: A Little Help from another person toileting, which includes using toliet, bedpan, or urinal?: A Lot Help from another person bathing (including washing, rinsing, drying)?: A Lot Help from another person to put on and taking off regular upper body clothing?: A Lot Help from another person to put on and taking off regular lower body clothing?: A Lot 6 Click Score: 15    End of Session Equipment Utilized During Treatment: Gait belt  OT Visit Diagnosis: Unsteadiness on feet (R26.81);Muscle weakness (generalized) (M62.81);History of falling (Z91.81)   Activity Tolerance Patient tolerated treatment well   Patient Left in chair;with call bell/phone within reach;with chair alarm set;with family/visitor present;Other (comment) (MD in room)   Nurse Communication Mobility status (pt left in chair)        Time: 1217-1227 OT Time Calculation (min): 10 min  Charges: OT General Charges $OT Visit: 1 Visit OT Treatments $Self Care/Home Management : 8-22 mins  Dessie Coma, M.S. OTR/L  05/28/20, 1:54 PM  ascom [phone]

## 2020-05-29 ENCOUNTER — Inpatient Hospital Stay: Payer: Medicare Other

## 2020-05-29 DIAGNOSIS — J189 Pneumonia, unspecified organism: Secondary | ICD-10-CM | POA: Diagnosis not present

## 2020-05-29 DIAGNOSIS — A419 Sepsis, unspecified organism: Secondary | ICD-10-CM | POA: Diagnosis not present

## 2020-05-29 DIAGNOSIS — R52 Pain, unspecified: Secondary | ICD-10-CM | POA: Diagnosis not present

## 2020-05-29 DIAGNOSIS — J90 Pleural effusion, not elsewhere classified: Secondary | ICD-10-CM | POA: Diagnosis not present

## 2020-05-29 LAB — PH, BODY FLUID: pH, Body Fluid: 7.6

## 2020-05-29 LAB — PROTEIN, BODY FLUID (OTHER): Total Protein, Body Fluid Other: 1.9 g/dL

## 2020-05-29 LAB — BASIC METABOLIC PANEL
Anion gap: 9 (ref 5–15)
BUN: 41 mg/dL — ABNORMAL HIGH (ref 8–23)
CO2: 20 mmol/L — ABNORMAL LOW (ref 22–32)
Calcium: 8.3 mg/dL — ABNORMAL LOW (ref 8.9–10.3)
Chloride: 113 mmol/L — ABNORMAL HIGH (ref 98–111)
Creatinine, Ser: 1.29 mg/dL — ABNORMAL HIGH (ref 0.44–1.00)
GFR, Estimated: 39 mL/min — ABNORMAL LOW (ref >60)
Glucose, Bld: 118 mg/dL — ABNORMAL HIGH (ref 70–99)
Potassium: 5.1 mmol/L (ref 3.5–5.1)
Sodium: 142 mmol/L (ref 135–145)

## 2020-05-29 MED ORDER — PREDNISONE 10 MG PO TABS
10.0000 mg | ORAL_TABLET | Freq: Every day | ORAL | Status: DC
  Administered 2020-05-30 – 2020-05-31 (×2): 10 mg via ORAL
  Filled 2020-05-29 (×2): qty 1

## 2020-05-29 MED ORDER — AMLODIPINE BESYLATE 5 MG PO TABS
2.5000 mg | ORAL_TABLET | Freq: Every day | ORAL | Status: DC
  Administered 2020-05-29 – 2020-05-31 (×3): 2.5 mg via ORAL
  Filled 2020-05-29 (×3): qty 1

## 2020-05-29 NOTE — NC FL2 (Signed)
[place] LEVEL OF CARE SCREENING TOOL     IDENTIFICATION  Patient Name: Amy [name] Birthdate: [birth year] Sex: female Admission Date (Current Location): 05/18/2020  [place] and [place] Number:  Engineering geologist and Address:  [facility], [address] [place], [place] [address]      Provider Number: [id number]  Attending Physician Name and Address:  Loletha Grayer, MD  Relative Name and Phone Number:  Biridiana Twardowski [phone]    Current Level of Care: Hospital Recommended Level of Care: [place] Prior Approval Number:    Date Approved/Denied:   PASRR Number: [id number]  Discharge Plan: SNF    Current Diagnoses: Patient Active Problem List   Diagnosis Date Noted  . Pain   . Acute metabolic encephalopathy   . Pleural effusion, left   . Multifocal pneumonia 05/18/2020  . Elevated troponin 05/18/2020  . Abdominal pain 05/18/2020  . Diarrhea 05/18/2020  . Acute respiratory failure with hypoxia ([place])   . Fall 04/23/2020  . Closed right arm fracture 04/23/2020  . T3 vertebral fracture ([place]) 04/23/2020  . Closed nondisplaced fracture of head of right radius   . AF (paroxysmal atrial fibrillation) ([place]) 03/06/2020  . COVID-19 virus infection 03/06/2020  . Seizure ([place]) 03/06/2020  . New onset seizure ([place]) 03/06/2020  . Acute on chronic diastolic heart failure ([place]) 02/02/2020  . Moderate persistent asthma 02/02/2020  . SOB (shortness of breath) 02/02/2020  . Anemia associated with chronic renal failure 10/22/2019  . Postherpetic neuralgia 10/18/2019  . Neuropathic pain of chest 10/18/2019  . Chronic pain syndrome 10/18/2019  . NSTEMI (non-ST elevated myocardial infarction) ([place]) 08/18/2019  . Syncope and collapse 08/13/2019  . Unresponsiveness 08/12/2019  . Hyperkalemia 08/12/2019  . HTN (hypertension) 08/12/2019  . GERD (gastroesophageal reflux disease) 08/12/2019  . Hypothyroidism  08/12/2019  . CAD (coronary artery disease) 08/12/2019  . Shingles 08/12/2019  . Carotid arterial disease ([place])   . Hypotension   . Groin pain, right   . Chest pain 08/06/2019  . Renovascular hypertension 03/08/2019  . Renal artery stenosis ([place]) 01/28/2019  . Multiple renal cysts 12/29/2018  . Renal stone 12/29/2018  . CKD (chronic kidney disease) stage 4, GFR 15-29 ml/min (HCC) 11/23/2018  . Diabetes ([place]) 06/29/2017  . Severe sepsis ([place]) 05/16/2017  . CAP (community acquired pneumonia) 05/16/2017  . GI bleed 09/21/2016  . Vitamin D deficiency 11/04/2015  . Multiple lung nodules   . COPD with chronic bronchitis ([place])   . New onset atrial fibrillation ([place]) 10/04/2015  . Anemia of chronic disease 10/04/2015  . Acute kidney injury superimposed on CKD ([place]) 10/03/2015  . Palliative care encounter 06/05/2015  . Morbid obesity ([place]) 12/05/2014  . Angina pectoris associated with type 2 diabetes mellitus ([place]) 12/05/2014  . Type 2 diabetes mellitus with hyperglycemia ([place]) 08/22/2014  . Chronic diastolic CHF (congestive heart failure) ([place]) 09/06/2013  . Preop cardiovascular exam 06/03/2012  . Iron deficiency anemia 09/08/2011  . HLD (hyperlipidemia) 07/18/2009  . Coronary artery disease involving native coronary artery of native heart without angina pectoris 07/18/2009  . Acquired hypothyroidism 07/12/2009  . Essential hypertension 07/12/2009    Orientation RESPIRATION BLADDER Height & Weight     Self,Place  O2,Other (Comment) (O2-2LNC, Bi-Pap) External catheter Weight: 81.3 kg Height:  5\' 2"  (157.5 cm)  BEHAVIORAL SYMPTOMS/MOOD NEUROLOGICAL BOWEL NUTRITION STATUS      Continent Diet  AMBULATORY STATUS COMMUNICATION OF NEEDS Skin   Extensive Assist Verbally  Personal Care Assistance Level of Assistance  Bathing,Feeding,Dressing Bathing Assistance: Maximum assistance Feeding assistance: Independent Dressing Assistance: Limited assistance      Functional Limitations Info  Sight,Hearing,Speech Sight Info: Adequate Hearing Info: Adequate Speech Info: Adequate    SPECIAL CARE FACTORS FREQUENCY  PT (By licensed PT),OT (By licensed OT)     PT Frequency: 2x week OT Frequency: 2x week            Contractures Contractures Info: Not present    Additional Factors Info  Code Status,Allergies Code Status Info: DNR Allergies Info: Atenolol, Codeine, Losartan, Morphine, Fentanyl, Nsaids, Tramadol, Sulfa, Sucralfate, Rofecoxib           Current Medications (05/29/2020):  This is the current hospital active medication list Current Facility-Administered Medications  Medication Dose Route Frequency Provider Last Rate Last Admin  . acetaminophen (TYLENOL) tablet 650 mg  650 mg Oral Q6H PRN Oswald Hillock, RPH   650 mg at 05/26/20 1105  . albuterol (PROVENTIL) (2.5 MG/3ML) 0.083% nebulizer solution 2.5 mg  2.5 mg Nebulization Q4H PRN Ivor Costa, MD      . atorvastatin (LIPITOR) tablet 20 mg  20 mg Oral Daily Oswald Hillock, RPH   20 mg at 05/28/20 2049  . bisacodyl (DULCOLAX) suppository 10 mg  10 mg Rectal QHS Loletha Grayer, MD   10 mg at 05/25/20 2050  . Chlorhexidine Gluconate Cloth 2 % PADS 6 each  6 each Topical Daily Loletha Grayer, MD   6 each at 05/29/20 [phone]  . clopidogrel (PLAVIX) tablet 75 mg  75 mg Oral Daily Ivor Costa, MD   75 mg at 05/29/20 9470  . dextromethorphan-guaiFENesin (MUCINEX DM) 30-600 MG per 12 hr tablet 1 tablet  1 tablet Oral BID Wyvonnia Dusky, MD   1 tablet at 05/29/20 [phone]  . docusate sodium (COLACE) capsule 100 mg  100 mg Oral BID PRN Ivor Costa, MD      . enoxaparin (LOVENOX) injection 30 mg  30 mg Subcutaneous Q24H Loletha Grayer, MD   30 mg at 05/28/20 2048  . ipratropium-albuterol (DUONEB) 0.5-2.5 (3) MG/3ML nebulizer solution 3 mL  3 mL Nebulization BID Wyvonnia Dusky, MD   3 mL at 05/28/20 2136  . iron polysaccharides (NIFEREX) capsule 150 mg  150 mg Oral Daily Ivor Costa, MD    150 mg at 05/29/20 3662  . levETIRAcetam (KEPPRA) tablet 250 mg  250 mg Oral BID Oswald Hillock, RPH   250 mg at 05/29/20 9476  . levothyroxine (SYNTHROID) tablet 100 mcg  100 mcg Oral Q0600 Wyvonnia Dusky, MD   100 mcg at 05/29/20 0500  . magnesium oxide (MAG-OX) tablet 400 mg  400 mg Oral Daily Loletha Grayer, MD   400 mg at 05/29/20 0837  . melatonin tablet 5 mg  5 mg Oral QHS PRN Ivor Costa, MD   5 mg at 05/20/20 2108  . mometasone-formoterol (DULERA) 200-5 MCG/ACT inhaler 2 puff  2 puff Inhalation BID Ivor Costa, MD   2 puff at 05/29/20 [phone]  . montelukast (SINGULAIR) tablet 10 mg  10 mg Oral QHS Ivor Costa, MD   10 mg at 05/28/20 2050  . nitroGLYCERIN (NITROSTAT) SL tablet 0.4 mg  0.4 mg Sublingual Q5 Min x 3 PRN Ivor Costa, MD      . ondansetron [facility]) injection 4 mg  4 mg Intravenous Q8H PRN Ivor Costa, MD      . pantoprazole (PROTONIX) EC tablet 20 mg  20 mg Oral Daily Eleonore Chiquito  S, RPH   20 mg at 05/29/20 0839  . polyethylene glycol (MIRALAX / GLYCOLAX) packet 17 g  17 g Oral Daily PRN Ivor Costa, MD      . predniSONE (DELTASONE) tablet 20 mg  20 mg Oral Q breakfast Loletha Grayer, MD   20 mg at 05/29/20 0836  . pregabalin (LYRICA) capsule 50 mg  50 mg Oral BID Loletha Grayer, MD   50 mg at 05/29/20 3846  . sodium chloride flush (NS) 0.9 % injection 10-40 mL  10-40 mL Intracatheter Q12H Minna Merritts, MD   10 mL at 05/29/20 0842  . sodium chloride flush (NS) 0.9 % injection 10-40 mL  10-40 mL Intracatheter PRN Minna Merritts, MD      . sodium phosphate (FLEET) 7-19 GM/118ML enema 1 enema  1 enema Rectal Daily Loletha Grayer, MD   1 enema at 05/25/20 0901  . torsemide (DEMADEX) tablet 10 mg  10 mg Oral Daily Loletha Grayer, MD   10 mg at 05/29/20 0837  . traMADol (ULTRAM) tablet 50 mg  50 mg Oral Q12H PRN Wyvonnia Dusky, MD   50 mg at 05/28/20 1840   Facility-Administered Medications Ordered in Other Encounters  Medication Dose Route Frequency Provider  Last Rate Last Admin  . 0.9 %  sodium chloride infusion   Intravenous Continuous Lloyd Huger, MD         Discharge Medications: Please see discharge summary for a list of discharge medications.  Relevant Imaging Results:  Relevant Lab Results:   Additional Information SS# [id number]  Kerin Salen, RN

## 2020-05-29 NOTE — Progress Notes (Signed)
Pulmonary Medicine          Date: 05/29/2020,   MRN# [id number] WILMOTH RASNIC 08-01-1927     AdmissionWeight: 86.2 kg                 CurrentWeight: 81.3 kg   Referring physician: Dr Earleen Newport    CHIEF COMPLAINT:   Multifocal Pneumonia with parapneumonic effusion.   HISTORY OF PRESENT ILLNESS   As per admission h/p Patient states that she started having abdominal pain which was located in the left side of the abdomen, associated with nausea and diarrhea.  She has had 3-4 times of loose stool bowel movement.  She also had fever of 101 in facility, currently body temperature is 97.5 in ED. Patient has cough and shortness of breath which has been progressively worsening.  She was found to have oxygen desaturation to 85% on room air, which improved to 94% on 5 L oxygen.  Denies chest pain.  No rectal bleeding or dark stool.  Pt was found to have WBC 4.7, lactic acid 2.9, 1.5, INR 1.1, PTT 31, troponin level 638, 933, positive Covid PCR, renal function close to baseline, temperature 97.5, softer blood pressure 93/39, heart rate 98, RR 28.  Chest x-ray showed multifocal infiltration mainly in the left side, vascular congestion and cardiomegaly.  CT of the chest/abdomen/pelvis showed left lower lobe infiltration,  no acute issues intra-abdominally. After 9 day pneumonia tx and thoracentesis patient still has bilateral infiltrates and PCCM consultation placed for additional evaluation and management.   05/28/20- Patient is s/p thoracentesis, she feels better already. She is working with [employer] therapy and feels that after first session she is already breathing better.  She had OT today. We discussed using incentive spiroemetry.  I discussed her care plan with Dr Leslye Peer today.   05/29/20- repeat CXR this am with residual effusion on left and bibasilar atelectasis.  We discussed possibly needing repeat thoracentesis.  She is on room air , daughter in room. I reviwed plan with  family , Respiratory therapist Demetria and Attending physician Dr Leslye Peer. We will focus on continuing medical mgt, placement, PT/OT and outpatient follow up.    PAST MEDICAL HISTORY   Past Medical History:  Diagnosis Date  . Bladder incontinence   . CAD (coronary artery disease)    a. 05/2009 Cath: LAD 90p (Xience 2.75 x 12 mm DES), 56m D1 40, LCx 40p/m, RCA 30/40/30p/m, RCA 30/25d;  b.  12/2011 Lexiscan MV: no ischemia, breast attenuation artifact, normal EF-->Low risk; c. 10/2016 MV: fixed apical defect, most likely apical thinning and attenuation, No ischemia, EF 60%.  . Cancer ([place]    ovarian  . Carotid arterial disease w/ R Carotid Bruit (HCC)    a. 08/2016 Carotid U/S: 40-59% bilat ICA stenosis - f/u 1 yr.  . Chronic diastolic (congestive) heart failure ([place]    a. Echo 09/2015: EF 55-60% w/ Grade 1 DD, sev Ca2+ MV annulus, mildly dil LA; b. 09/2016 Echo: EF 65-70%, Gr2 DD, mildly dil LA/RA, nl RV fxn.  . Chronic Dyspnea on exertion   . CKD (chronic kidney disease) stage 3, GFR 30-59 ml/min (HCC)   . Degenerative arthritis of knee    bilateral knees  . Diabetes mellitus    Type II  . GIB (gastrointestinal bleeding)    a. 08/2016 Admission w/ presyncope/anemia/melena-->req Transfusion-->endo ok, colonoscopy w/ polyps but no source of bleeding (most likely diverticular).  . Hiatal hernia   . Hypertension   .  Iron deficiency   . Menopausal symptoms   . Morbid obesity ([place])   . Renal insufficiency   . Thyroid disease    hypothyroidism     SURGICAL HISTORY   Past Surgical History:  Procedure Laterality Date  . COLONOSCOPY  2015  . COLONOSCOPY WITH PROPOFOL N/A 09/25/2016   Procedure: COLONOSCOPY WITH PROPOFOL;  Surgeon: [name], MD;  Location: [facility] ENDOSCOPY;  Service: Gastroenterology;  Laterality: N/A;  . COLONOSCOPY WITH PROPOFOL N/A 09/26/2016   Procedure: COLONOSCOPY WITH PROPOFOL;  Surgeon: [name], MD;  Location: [facility] ENDOSCOPY;  Service: Gastroenterology;   Laterality: N/A;  . ESOPHAGOGASTRODUODENOSCOPY (EGD) WITH PROPOFOL N/A 09/25/2016   Procedure: ESOPHAGOGASTRODUODENOSCOPY (EGD) WITH PROPOFOL;  Surgeon: [name], MD;  Location: [facility] ENDOSCOPY;  Service: Gastroenterology;  Laterality: N/A;  . RENAL ANGIOGRAPHY N/A 02/07/2019   Procedure: RENAL ANGIOGRAPHY;  Surgeon: [name], MD;  Location: [place] CV LAB;  Service: Cardiovascular;  Laterality: N/A;  . REPLACEMENT TOTAL KNEE BILATERAL    . TOTAL VAGINAL HYSTERECTOMY     ovarian mass, not cancerous  . UPPER GI ENDOSCOPY  2015     FAMILY HISTORY   Family History  Problem Relation Age of Onset  . Breast cancer Mother 41     SOCIAL HISTORY   Social History   Tobacco Use  . Smoking status: Never Smoker  . Smokeless tobacco: Never Used  Vaping Use  . Vaping Use: Never used  Substance Use Topics  . Alcohol use: No  . Drug use: No     MEDICATIONS    Home Medication:    Current Medication:  Current Facility-Administered Medications:  .  acetaminophen (TYLENOL) tablet 650 mg, 650 mg, Oral, Q6H PRN, Oswald Hillock, RPH, 650 mg at 05/26/20 1105 .  albuterol (PROVENTIL) (2.5 MG/3ML) 0.083% nebulizer solution 2.5 mg, 2.5 mg, Nebulization, Q4H PRN, Ivor Costa, MD .  atorvastatin (LIPITOR) tablet 20 mg, 20 mg, Oral, Daily, Oswald Hillock, RPH, 20 mg at 05/28/20 2049 .  bisacodyl (DULCOLAX) suppository 10 mg, 10 mg, Rectal, QHS, Wieting, Richard, MD, 10 mg at 05/25/20 2050 .  Chlorhexidine Gluconate Cloth 2 % PADS 6 each, 6 each, Topical, Daily, Loletha Grayer, MD, 6 each at 05/29/20 [phone] .  clopidogrel (PLAVIX) tablet 75 mg, 75 mg, Oral, Daily, Ivor Costa, MD, 75 mg at 05/29/20 9528 .  dextromethorphan-guaiFENesin (MUCINEX DM) 30-600 MG per 12 hr tablet 1 tablet, 1 tablet, Oral, BID, Wyvonnia Dusky, MD, 1 tablet at 05/29/20 [phone] .  docusate sodium (COLACE) capsule 100 mg, 100 mg, Oral, BID PRN, Ivor Costa, MD .  enoxaparin (LOVENOX) injection 30 mg, 30 mg,  Subcutaneous, Q24H, Wieting, Richard, MD, 30 mg at 05/28/20 2048 .  ipratropium-albuterol (DUONEB) 0.5-2.5 (3) MG/3ML nebulizer solution 3 mL, 3 mL, Nebulization, BID, Wyvonnia Dusky, MD, 3 mL at 05/28/20 2136 .  iron polysaccharides (NIFEREX) capsule 150 mg, 150 mg, Oral, Daily, Ivor Costa, MD, 150 mg at 05/29/20 4401 .  levETIRAcetam (KEPPRA) tablet 250 mg, 250 mg, Oral, BID, Oswald Hillock, RPH, 250 mg at 05/29/20 0272 .  levothyroxine (SYNTHROID) tablet 100 mcg, 100 mcg, Oral, Q0600, Wyvonnia Dusky, MD, 100 mcg at 05/29/20 0500 .  magnesium oxide (MAG-OX) tablet 400 mg, 400 mg, Oral, Daily, Leslye Peer, Richard, MD, 400 mg at 05/29/20 0837 .  melatonin tablet 5 mg, 5 mg, Oral, QHS PRN, Ivor Costa, MD, 5 mg at 05/20/20 2108 .  mometasone-formoterol (DULERA) 200-5 MCG/ACT inhaler 2 puff, 2 puff, Inhalation, BID,  Ivor Costa, MD, 2 puff at 05/29/20 [phone] .  montelukast (SINGULAIR) tablet 10 mg, 10 mg, Oral, QHS, Ivor Costa, MD, 10 mg at 05/28/20 2050 .  nitroGLYCERIN (NITROSTAT) SL tablet 0.4 mg, 0.4 mg, Sublingual, Q5 Min x 3 PRN, Ivor Costa, MD .  ondansetron [facility]) injection 4 mg, 4 mg, Intravenous, Q8H PRN, Ivor Costa, MD .  pantoprazole (PROTONIX) EC tablet 20 mg, 20 mg, Oral, Daily, Oswald Hillock, RPH, 20 mg at 05/29/20 0839 .  polyethylene glycol (MIRALAX / GLYCOLAX) packet 17 g, 17 g, Oral, Daily PRN, Ivor Costa, MD .  predniSONE (DELTASONE) tablet 20 mg, 20 mg, Oral, Q breakfast, Leslye Peer, Richard, MD, 20 mg at 05/29/20 0836 .  pregabalin (LYRICA) capsule 50 mg, 50 mg, Oral, BID, Leslye Peer, Richard, MD, 50 mg at 05/29/20 9528 .  sodium chloride flush (NS) 0.9 % injection 10-40 mL, 10-40 mL, Intracatheter, Q12H, Gollan, Kathlene November, MD, 10 mL at 05/29/20 0842 .  sodium chloride flush (NS) 0.9 % injection 10-40 mL, 10-40 mL, Intracatheter, PRN, Rockey Situ, Kathlene November, MD .  sodium phosphate (FLEET) 7-19 GM/118ML enema 1 enema, 1 enema, Rectal, Daily, Loletha Grayer, MD, 1 enema at 05/25/20  0901 .  torsemide (DEMADEX) tablet 10 mg, 10 mg, Oral, Daily, Leslye Peer, Richard, MD, 10 mg at 05/29/20 0837 .  traMADol (ULTRAM) tablet 50 mg, 50 mg, Oral, Q12H PRN, Wyvonnia Dusky, MD, 50 mg at 05/28/20 1840  Facility-Administered Medications Ordered in Other Encounters:  .  0.9 %  sodium chloride infusion, , Intravenous, Continuous, Finnegan, Kathlene November, MD    ALLERGIES   Atenolol, Codeine, Losartan, Morphine and related, Fentanyl, Nsaids, Rofecoxib, Sucralfate, Sulfa antibiotics, and Tramadol     REVIEW OF SYSTEMS    Review of Systems:  Gen:  Denies  fever, sweats, chills weigh loss  HEENT: Denies blurred vision, double vision, ear pain, eye pain, hearing loss, nose bleeds, sore throat Cardiac:  No dizziness, chest pain or heaviness, chest tightness,edema Resp:   Denies cough or sputum porduction, shortness of breath,wheezing, hemoptysis,  Gi: Denies swallowing difficulty, stomach pain, nausea or vomiting, diarrhea, constipation, bowel incontinence Gu:  Denies bladder incontinence, burning urine Ext:   Denies Joint pain, stiffness or swelling Skin: Denies  skin rash, easy bruising or bleeding or hives Endoc:  Denies polyuria, polydipsia , polyphagia or weight change Psych:   Denies depression, insomnia or hallucinations   Other:  All other systems negative   VS: BP (!) 182/60 (BP Location: Right Arm)   Pulse 69   Temp 98.1 F (36.7 C) (Oral)   Resp 20   Ht 5' 2"  (1.575 m)   Wt 81.3 kg   SpO2 91%   BMI 32.78 kg/m      PHYSICAL EXAM    GENERAL:NAD, no fevers, chills, no weakness no fatigue HEAD: Normocephalic, atraumatic.  EYES: Pupils equal, round, reactive to light. Extraocular muscles intact. No scleral icterus.  MOUTH: Moist mucosal membrane. Dentition intact. No abscess noted.  EAR, NOSE, THROAT: Clear without exudates. No external lesions.  NECK: Supple. No thyromegaly. No nodules. No JVD.  PULMONARY: Diffuse coarse rhonchi right sided  +wheezes CARDIOVASCULAR: S1 and S2. Regular rate and rhythm. No murmurs, rubs, or gallops. No edema. Pedal pulses 2+ bilaterally.  GASTROINTESTINAL: Soft, nontender, nondistended. No masses. Positive bowel sounds. No hepatosplenomegaly.  MUSCULOSKELETAL: No swelling, clubbing, or edema. Range of motion full in all extremities.  NEUROLOGIC: Cranial nerves II through XII are intact. No gross focal neurological deficits. Sensation intact. Reflexes intact.  SKIN: No ulceration, lesions, rashes, or cyanosis. Skin warm and dry. Turgor intact.  PSYCHIATRIC: Mood, affect within normal limits. The patient is awake, alert and oriented x 3. Insight, judgment intact.       IMAGING    CT HEAD WO CONTRAST  Result Date: 05/24/2020 CLINICAL DATA:  Mental status change EXAM: CT HEAD WITHOUT CONTRAST TECHNIQUE: Contiguous axial images were obtained from the base of the skull through the vertex without intravenous contrast. COMPARISON:  05/22/2020 FINDINGS: Brain: There is no acute intracranial hemorrhage, mass effect, or edema. No new loss of gray-white differentiation. Small chronic left cerebellar infarct. Patchy hypoattenuation in the supratentorial white matter likely reflects stable chronic microvascular ischemic changes. There is no extra-axial fluid collection. Ventricles and sulci are stable in size and configuration. Vascular: There is atherosclerotic calcification at the skull base. Skull: Calvarium is unremarkable. Sinuses/Orbits: Mild mucosal thickening. No acute orbital abnormality. Other: None. IMPRESSION: No acute intracranial abnormality. No significant change since recent prior study. Electronically Signed   By: Macy Mis M.D.   On: 05/24/2020 17:14   CT CHEST WO CONTRAST  Result Date: 05/27/2020 CLINICAL DATA:  Pneumonia.  Abnormal chest radiograph EXAM: CT CHEST WITHOUT CONTRAST TECHNIQUE: Multidetector CT imaging of the chest was performed following the standard protocol without IV  contrast. COMPARISON:  Chest CT 05/18/2020, radiograph 05/27/2020 FINDINGS: Cardiovascular: Valvular calcification. Coronary artery calcification and aortic atherosclerotic calcification. Mediastinum/Nodes: 11 mm RIGHT lower paratracheal lymph node. Lungs/Pleura: Large volume LEFT pleural effusion occupying the near entirety of the LEFT hemithorax. There is passive atelectasis with complete atelectasis of the LEFT lower lobe and lingula. Small volume of LEFT upper lobe is aerated. Small moderate RIGHT pleural effusion with mild passive atelectasis. Upper Abdomen: Limited view of the liver, kidneys, pancreas are unremarkable. Normal adrenal glands. Musculoskeletal: No aggressive osseous lesion. Degenerative osteophytosis of the spine. IMPRESSION: 1. Progressive large LEFT pleural effusion now occupies the near entirety of the LEFT hemithorax. Complete passive atelectasis of the LEFT lower lobe and lingula. Minimal aeration of the LEFT upper lobe. 2. Small to moderate RIGHT pleural effusion. 3. Coronary artery calcification and Aortic Atherosclerosis (ICD10-I70.0). Electronically Signed   By: Suzy Bouchard M.D.   On: 05/27/2020 18:06   DG Chest Port 1 View  Result Date: 05/28/2020 CLINICAL DATA:  Status post left-sided thoracentesis EXAM: PORTABLE CHEST 1 VIEW COMPARISON:  CT chest and chest x-ray obtained yesterday FINDINGS: No evidence of pneumothorax following left-sided thoracentesis. Slightly improved aeration of the left upper lung. Persistent dense opacification of the left mid and lower chest which may represent persistent pleural fluid versus persistent atelectasis. The right lung remains clear. Chronic right proximal humeral fracture again noted. No acute osseous abnormality. Atherosclerotic calcifications present in the transverse aorta. IMPRESSION: 1. No evidence of pneumothorax following left-sided thoracentesis. 2. Improved aeration of the left upper lung but persistent opacification of the left  mid and lower lung. Findings may represent residual atelectasis versus residual pleural fluid. Electronically Signed   By: Jacqulynn Cadet M.D.   On: 05/28/2020 11:24   DG Chest Port 1 View  Result Date: 05/27/2020 CLINICAL DATA:  Pneumonia. EXAM: PORTABLE CHEST 1 VIEW COMPARISON:  05/24/2020; 05/22/2020 FINDINGS: Grossly unchanged cardiac silhouette and mediastinal contours with persistent obscuration of the right heart border secondary to unchanged left-sided pleural effusion and associated left mid lower lung consolidative opacities, similar to the 05/22/2020 examination. Atherosclerotic plaque within the aortic arch and mitral valve calcifications. Mild pulmonary venous congestion within the otherwise well aerated right  lung. No definite right-sided pleural effusion though the right costophrenic angle is excluded from view. No acute osseous abnormalities. IMPRESSION: 1. Similar appearing extensive left lung consolidative opacities and small left-sided effusion worrisome for multifocal pneumonia. 2. Suspected pulmonary venous congestion within the otherwise well aerated right lung. Electronically Signed   By: Sandi Mariscal M.D.   On: 05/27/2020 09:30   DG Chest Port 1 View  Result Date: 05/24/2020 CLINICAL DATA:  Post left-sided thoracentesis. EXAM: PORTABLE CHEST 1 VIEW COMPARISON:  Chest radiograph-05/22/2020; 05/18/2020; chest CT-05/18/2020 FINDINGS: Grossly unchanged cardiac silhouette and mediastinal contours with atherosclerotic plaque within thoracic aorta. Dystrophic calcifications within the mitral valve annulus. Interval reduction/near resolution of left-sided pleural effusion post thoracentesis with improved aeration of the left lung apex. Redemonstrated extensive left mid and lower lung heterogeneous/consolidative opacities with associated left-sided volume loss and deviation of the cardiomediastinal structures to the left. Pulmonary vasculature within the right lung remains indistinct. No  definite right-sided pleural effusion. No acute osseous abnormalities. Redemonstrated chronic displaced fracture involving the anatomic neck of the right humerus, incompletely evaluated. IMPRESSION: 1. Interval reduction/near resolution of left-sided pleural effusion post thoracentesis. No pneumothorax. 2. Otherwise, similar findings of extensive left mid and lower lung heterogeneous/consolidative opacities with associated volume loss. Electronically Signed   By: Sandi Mariscal M.D.   On: 05/24/2020 12:15   DG Chest Port 1 View  Result Date: 05/22/2020 CLINICAL DATA:  Hypoxia EXAM: PORTABLE CHEST 1 VIEW COMPARISON:  05/18/2020 FINDINGS: Large left pleural effusion with worsening left upper consolidation. Chronic right humeral head fracture. Mild right chest opacity. IMPRESSION: Large left pleural effusion with worsening left upper consolidation. Electronically Signed   By: Ulyses Jarred M.D.   On: 05/22/2020 01:44   DG Chest Port 1 View  Result Date: 05/18/2020 CLINICAL DATA:  Shortness of breath and fever EXAM: PORTABLE CHEST 1 VIEW COMPARISON:  March 06, 2020 FINDINGS: There is extensive airspace opacity throughout most of the left lung. The right lung is clear. There is cardiomegaly with mild pulmonary venous hypertension. No adenopathy. There is aortic atherosclerosis. There is calcification in each carotid artery. No bone lesions. IMPRESSION: Widespread airspace opacity throughout much of the left lung consistent with multifocal pneumonia. Right lung clear. There is cardiomegaly with a degree of pulmonary vascular congestion. There is calcification in each carotid artery. Aortic Atherosclerosis (ICD10-I70.0). Electronically Signed   By: Lowella Grip III M.D.   On: 05/18/2020 09:08   CT HEAD CODE STROKE WO CONTRAST`  Result Date: 05/22/2020 CLINICAL DATA:  Code stroke.  Acute neurologic deficit EXAM: CT HEAD WITHOUT CONTRAST TECHNIQUE: Contiguous axial images were obtained from the base of the  skull through the vertex without intravenous contrast. COMPARISON:  None. FINDINGS: Brain: There is no mass, hemorrhage or extra-axial collection. The size and configuration of the ventricles and extra-axial CSF spaces are normal. There is hypoattenuation of the periventricular white matter, most commonly indicating chronic ischemic microangiopathy. Vascular: No abnormal hyperdensity of the major intracranial arteries or dural venous sinuses. No intracranial atherosclerosis. Skull: The visualized skull base, calvarium and extracranial soft tissues are normal. Sinuses/Orbits: No fluid levels or advanced mucosal thickening of the visualized paranasal sinuses. No mastoid or middle ear effusion. The orbits are normal. ASPECTS [facility] Stroke Program Early CT Score) - Ganglionic level infarction (caudate, lentiform nuclei, internal capsule, insula, M1-M3 cortex): 7 - Supraganglionic infarction (M4-M6 cortex): 3 Total score (0-10 with 10 being normal): 10 IMPRESSION: 1. Chronic ischemic microangiopathy without acute intracranial abnormality. 2. ASPECTS is 10. These  results were called by telephone at the time of interpretation on 05/22/2020 at 12:35 am to provider [facility] , who verbally acknowledged these results. Electronically Signed   By: Ulyses Jarred M.D.   On: 05/22/2020 00:44   ECHOCARDIOGRAM LIMITED  Result Date: 05/23/2020    ECHOCARDIOGRAM LIMITED REPORT   Patient Name:   SINEAD HOCKMAN Date of Exam: 05/23/2020 Medical Rec #:  [id number]          Height:       62.0 in Accession #:    [id number]         Weight:       181.8 lb Date of Birth:  1927-09-06          BSA:          1.836 m Patient Age:    87 years           BP:           133/36 mmHg Patient Gender: F                  HR:           56 bpm. Exam Location:  [facility] Procedure: Limited Echo, Color Doppler and Cardiac Doppler Indications:     Chest pain R07.9                  NSTEMI I21.4  History:         Patient has prior history of Echocardiogram  examinations, most                  recent 02/07/2020. Risk Factors:Hypertension and Diabetes.  Sonographer:     Sherrie Sport RDCS (AE) Referring Phys:  [id number] Arvil Chaco Diagnosing Phys: Kathlyn Sacramento MD IMPRESSIONS  1. Left ventricular ejection fraction, by estimation, is 55 to 60%. The left ventricle has normal function. Left ventricular endocardial border not optimally defined to evaluate regional wall motion. Left ventricular diastolic function could not be evaluated.  2. Right ventricular systolic function is normal. The right ventricular size is normal. Tricuspid regurgitation signal is inadequate for assessing PA pressure.  3. Left atrial size was mildly dilated.  4. Moderate pleural effusion.  5. The mitral valve is abnormal. No evidence of mitral valve regurgitation. No evidence of mitral stenosis. Moderate mitral annular calcification.  6. The aortic valve is normal in structure. Aortic valve regurgitation is not visualized. Mild aortic valve stenosis. No gradients were measured.  7. limited and challenging study. FINDINGS  Left Ventricle: Left ventricular ejection fraction, by estimation, is 55 to 60%. The left ventricle has normal function. Left ventricular endocardial border not optimally defined to evaluate regional wall motion. The left ventricular internal cavity size was normal in size. There is no left ventricular hypertrophy. Left ventricular diastolic function could not be evaluated. Right Ventricle: The right ventricular size is normal. No increase in right ventricular wall thickness. Right ventricular systolic function is normal. Tricuspid regurgitation signal is inadequate for assessing PA pressure. Left Atrium: Left atrial size was mildly dilated. Right Atrium: Right atrial size was normal in size. Pericardium: There is no evidence of pericardial effusion. Mitral Valve: The mitral valve is abnormal. Moderate mitral annular calcification. No evidence of mitral valve stenosis.  Tricuspid Valve: The tricuspid valve is normal in structure. Tricuspid valve regurgitation is not demonstrated. No evidence of tricuspid stenosis. Aortic Valve: The aortic valve is normal in structure. Aortic valve regurgitation is not visualized. Mild aortic stenosis is present. Pulmonic  Valve: The pulmonic valve was normal in structure. Pulmonic valve regurgitation is not visualized. No evidence of pulmonic stenosis. Aorta: The aortic root is normal in size and structure. Venous: The inferior vena cava was not well visualized. IAS/Shunts: No atrial level shunt detected by color flow Doppler. Additional Comments: There is a moderate pleural effusion. LEFT VENTRICLE PLAX 2D LVIDd:         3.92 cm LVIDs:         2.74 cm LV PW:         1.54 cm LV IVS:        0.97 cm LVOT diam:     2.00 cm LVOT Area:     3.14 cm  LEFT ATRIUM         Index LA diam:    5.10 cm 2.78 cm/m                        PULMONIC VALVE AORTA                 PV Vmax:        1.12 m/s Ao Root diam: 2.40 cm PV Peak grad:   5.0 mmHg                       RVOT Peak grad: 12 mmHg   SHUNTS Systemic Diam: 2.00 cm Kathlyn Sacramento MD Electronically signed by Kathlyn Sacramento MD Signature Date/Time: 05/23/2020/10:19:48 AM    Final    CT CHEST ABDOMEN PELVIS WO CONTRAST  Result Date: 05/18/2020 CLINICAL DATA:  Chest pain or shortness of breath. Pleural effusion or pleurisy suspected EXAM: CT CHEST, ABDOMEN AND PELVIS WITHOUT CONTRAST TECHNIQUE: Multidetector CT imaging of the chest, abdomen and pelvis was performed following the standard protocol without IV contrast. COMPARISON:  01/13/2017 FINDINGS: CT CHEST FINDINGS Cardiovascular: Normal heart size. No pericardial effusion. Bulky atherosclerotic calcification of the aorta and great vessels. Coronary atherosclerosis. Mediastinum/Nodes: No adenopathy or mass. Moderate sliding hiatal hernia. Lungs/Pleura: Dense consolidation throughout the left lung with poor visualization of central airways. Small left  pleural effusion. Musculoskeletal: Nonacute right humeral neck fracture with marked anterior displacement that is similar to initial diagnosis February 2022 by radiography. Extensive spondylosis and thoracic spine ankylosis. Multilevel degenerative facet spurring especially at the open T6-7 level where there is biforaminal impingement. CT ABDOMEN PELVIS FINDINGS Hepatobiliary: No focal liver abnormality.No evidence of biliary obstruction or stone. Pancreas: Unremarkable. Spleen: Unremarkable. Adrenals/Urinary Tract: Negative adrenals. No hydronephrosis or stone. Left upper pole renal lesion which is a cyst by ultrasound in 2020. Peripherally calcified structure in the left upper pole which was also described on prior. Unremarkable bladder. Stomach/Bowel: No obstruction. No visible bowel inflammation. Numerous distal colonic diverticula. Distal colonic stool with moderate distension of the rectum. No superimposed rectal wall thickening. Vascular/Lymphatic: No acute vascular abnormality. No mass or adenopathy. Reproductive:Hysterectomy Other: No ascites or pneumoperitoneum. Musculoskeletal: No acute abnormalities. Severe lumbar spine degeneration which is generalized. Generalized osteopenia. Hip osteoarthritis that is particularly advanced on the right. IMPRESSION: 1. Extensive left-sided pneumonia opacifying most of the left lung. Small left parapneumonic effusion. 2. No acute intra-abdominal finding. 3. Numerous chronic/known incidental findings which are described above. Electronically Signed   By: Monte Fantasia M.D.   On: 05/18/2020 11:21   US THORACENTESIS ASP PLEURAL SPACE W/IMG GUIDE  Result Date: 05/28/2020 INDICATION: Patient with history of multifocal pneumonia with left sided pleural effusion. Request is for therapeutic and diagnostic left-sided thoracentesis EXAM:  ULTRASOUND GUIDED THERAPEUTIC AND DIAGNOSTIC THORACENTESIS MEDICATIONS: Lidocaine 1% 10 mL COMPLICATIONS: None immediate. PROCEDURE: An  ultrasound guided thoracentesis was thoroughly discussed with the patient and questions answered. The benefits, risks, alternatives and complications were also discussed. The patient understands and wishes to proceed with the procedure. Written consent was obtained. Ultrasound was performed to localize and mark an adequate pocket of fluid in the left chest. The area was then prepped and draped in the normal sterile fashion. 1% Lidocaine was used for local anesthesia. Under ultrasound guidance a 6 Fr Safe-T-Centesis catheter was introduced. Thoracentesis was performed. The catheter was removed and a dressing applied. FINDINGS: A total of approximately 600 mL of straw-colored fluid was removed. Samples were sent to the laboratory as requested by the clinical team. IMPRESSION: Successful ultrasound guided therapeutic and diagnostic left-sided thoracentesis yielding 600 mL of pleural fluid. Read by: Rushie Nyhan, NP Electronically Signed   By: Corrie Mckusick D.O.   On: 05/28/2020 11:33   US THORACENTESIS ASP PLEURAL SPACE W/IMG GUIDE  Result Date: 05/24/2020 INDICATION: Symptomatic left sided pleural effusion. Please perform ultrasound-guided thoracentesis for diagnostic and therapeutic purposes. EXAM: US THORACENTESIS ASP PLEURAL SPACE W/IMG GUIDE COMPARISON:  Chest radiograph-05/22/2020; 05/18/2020; chest CT-05/18/2020 MEDICATIONS: None. COMPLICATIONS: None immediate. TECHNIQUE: Informed written consent was obtained from the the patient and the patient's daughter after a discussion of the risks, benefits and alternatives to treatment. A timeout was performed prior to the initiation of the procedure. Initial ultrasound scanning demonstrates a small left-sided pleural effusion with dominant component loculated regional to the left upper lobe. The posterior aspect of the left upper/mid chest was prepped and draped in the usual sterile fashion. 1% lidocaine was used for local anesthesia. Under direct ultrasound  guidance, an 8 Fr Safe-T-Centesis catheter was introduced. The thoracentesis was performed. The catheter was removed and a dressing was applied. The patient tolerated the procedure well without immediate post procedural complication. The patient was escorted to have an upright chest radiograph. FINDINGS: A total of approximately 250 cc of serous fluid was removed. Requested samples were sent to the laboratory. IMPRESSION: Successful ultrasound-guided left sided thoracentesis yielding 250 cc of pleural fluid. Electronically Signed   By: Sandi Mariscal M.D.   On: 05/24/2020 12:20      ASSESSMENT/PLAN   Acute on chronic hypoxemic respiratory failure S/p thoracentesis with negative gm stain , neutrophil predominant exudate. -patient significantly clinically improved, looks better then she did in office 4 months ago. -She has CKD and CHF with cardiology following - appreciate input -she has reactive airway disease with asthma and Obesity hypoventilation overlap and does benefit from BIPAP nocturnally. -She is with severe deconditioning which is precluding her from taking vigorous breath and she should have IS and Chest PT -Will obtain CT chest to better characterize pulmonary/respiratory status.  -Reviewed care plan with daughter and patient at bedside -Met with Attending physician and reviewed imaging and plan together.  -repeat thoracentesis - patient feels better - 4/5 - reviewed cxr - improved        Thank you for allowing me to participate in the care of this patient.    Patient/Family are satisfied with care plan and all questions have been answered.  This document was prepared using Dragon voice recognition software and may include unintentional dictation errors.     Ottie Glazier, M.D.  Division of [facility]

## 2020-05-29 NOTE — Progress Notes (Signed)
Physical Therapy Treatment Patient Details Name: Amy [name] MRN: [id number] DOB: Oct 07, 1927 Today's Date: 05/29/2020    History of Present Illness Pt is a 87 yo female that presented to hspital due to abdominal pain, SOB, fever. Workup showed PNA, sepsis, elevated troponin (per cardiology demand ischemia); pt placed on heparin drip this admission. PMH: CAD, Cancer, dCHF c LVEF 60-65%, hypoTSH, stg-IV CKD, anemia of chronic disease, asthma, GERD, bilat knee OA, covid in January of 2022, has tested positive in Feb and March, R humeral head fx with sling.    PT Comments    Pt resting in recliner upon PT arrival; pt's daughter present; pt agreeable to PT session.  x3 trials standing with 2 assist (see below for further details); able to come to 3/4th stand with L UE support each trial.  Pt able to shift laterally L/R and scoot forward/backward in recliner with SBA and extra time (in order to reposition for activities and adjust sheets in recliner).  Pt reporting 8/10 R shoulder pain end of session: nurse notified and brought pt pain medications.  Will continue to focus on strengthening and progressive functional mobility per pt tolerance.   Follow Up Recommendations  SNF     Equipment Recommendations  Other (comment) (TBD at next facility)    Recommendations for Other Services       Precautions / Restrictions Precautions Precautions: Fall;Shoulder Shoulder Interventions: Shoulder sling/immobilizer Precaution Booklet Issued: No Restrictions Weight Bearing Restrictions: Yes RUE Weight Bearing: Non weight bearing    Mobility  Bed Mobility               General bed mobility comments: Deferred (pt up in recliner upon PT arrival)    Transfers Overall transfer level: Needs assistance Equipment used: Hemi-walker Transfers: Sit to/from Stand Sit to Stand: +2 physical assistance         General transfer comment: x3 trials (1st and 3rd trial pt able to come to 3/4th stand  with 2 assist and place L UE on hemi-walker; 2nd trial pt able to come to 3/4th stand (keeping L hand on recliner armrest) in order to place clean linens under pt); 1 assist in front of pt assisting (therapists hands on pt's hips to assist with initiating stand and shifting hips forward to promote upright posture) and 1 assist on pt's R side assisting to stand with gait belt; vc's and assist for upright posture  Ambulation/Gait             General Gait Details: pt unable to fully stand to attempt taking steps   Stairs             Wheelchair Mobility    Modified Rankin (Stroke Patients Only)       Balance Overall balance assessment: Needs assistance Sitting-balance support: Feet supported Sitting balance-Leahy Scale: Good Sitting balance - Comments: steady sitting reaching within BOS with L UE                                    Cognition Arousal/Alertness: Awake/alert Behavior During Therapy: WFL for tasks assessed/performed Overall Cognitive Status: Within Functional Limits for tasks assessed                                 General Comments: Pt able to recall shoulder precautions      Exercises  General Comments   Nursing cleared pt for participation in physical therapy.  Pt agreeable to PT session.  Pt's daughter present during session.      Pertinent Vitals/Pain Pain Assessment: 0-10 Pain Score: 8  Pain Location: L shoulder Pain Descriptors / Indicators: Aching;Sore Pain Intervention(s): Limited activity within patient's tolerance;Monitored during session;Repositioned;Patient requesting pain meds-RN notified (R UE supported in sling on pillow in chair)  Vitals (HR and O2 on room air) stable and WFL throughout treatment session.  HR in 70's bpm during sessions activities and O2 sats 94% or greater on room air.    Home Living                      Prior Function            PT Goals (current goals can now be  found in the care plan section) Acute Rehab PT Goals Patient Stated Goal: to go home PT Goal Formulation: With patient Time For Goal Achievement: 06/04/20 Potential to Achieve Goals: Fair Progress towards PT goals: Progressing toward goals    Frequency    Min 2X/week      PT Plan Current plan remains appropriate    Co-evaluation              AM-PAC PT "6 Clicks" Mobility   Outcome Measure  Help needed turning from your back to your side while in a flat bed without using bedrails?: A Lot Help needed moving from lying on your back to sitting on the side of a flat bed without using bedrails?: A Lot Help needed moving to and from a bed to a chair (including a wheelchair)?: Total Help needed standing up from a chair using your arms (e.g., wheelchair or bedside chair)?: Total Help needed to walk in hospital room?: Total Help needed climbing 3-5 steps with a railing? : Total 6 Click Score: 8    End of Session Equipment Utilized During Treatment: Gait belt;Other (comment) (R UE sling) Activity Tolerance: Patient limited by fatigue Patient left: in chair;with call bell/phone within reach;with chair alarm set;with nursing/sitter in room;with family/visitor present;Other (comment) (R UE supported in sling on pillow; B LE's elevated on pillow with B heels floating; purewick in place) Nurse Communication: Mobility status;Precautions;Patient requests pain meds;Weight bearing status PT Visit Diagnosis: Muscle weakness (generalized) (M62.81);Other abnormalities of gait and mobility (R26.89)     Time: 8469-6295 PT Time Calculation (min) (ACUTE ONLY): 44 min  Charges:  $Therapeutic Activity: 38-52 mins                    Leitha Bleak, PT 05/29/20, 12:24 PM

## 2020-05-29 NOTE — Progress Notes (Signed)
Occupational Therapy Treatment Patient Details Name: Amy [name] MRN: [id number] DOB: 06/27/1927 Today's Date: 05/29/2020    History of present illness Pt is a 87 yo female that presented to hspital due to abdominal pain, SOB, fever. Workup showed PNA, sepsis, elevated troponin (per cardiology demand ischemia); pt placed on heparin drip this admission. PMH: CAD, Cancer, dCHF c LVEF 60-65%, hypoTSH, stg-IV CKD, anemia of chronic disease, asthma, GERD, bilat knee OA, covid in January of 2022, has tested positive in Feb and March, R humeral head fx with sling.   OT comments  Amy [name] was seen for OT treatment on this date. Upon arrival to room pt reclined in chair, agreeable to standing trials for pericare. Pt requires increased time and MAX cues for technique, but continues to be motivated to participate. MAX A for LBD seated EOB. MAX A sit<>stand, required 3 trials to fully clear rear from chair and +2 assist for pericare in standing. Pt making good progress toward goals. Pt continues to benefit from skilled OT services to maximize return to PLOF and minimize risk of future falls, injury, caregiver burden, and readmission. Will continue to follow POC. Discharge recommendation remains appropriate.    Follow Up Recommendations  SNF    Equipment Recommendations  Other (comment) (defer to next venue of care)    Recommendations for Other Services      Precautions / Restrictions Precautions Precautions: Fall;Shoulder Shoulder Interventions: Shoulder sling/immobilizer Precaution Booklet Issued: No Restrictions Weight Bearing Restrictions: Yes RUE Weight Bearing: Non weight bearing       Mobility Bed Mobility               General bed mobility comments: pt received and left in chair    Transfers Overall transfer level: Needs assistance Equipment used: None Transfers: Sit to/from Stand Sit to Stand: Max assist         General transfer comment: attempted standing  for pericare - x3 trials to achieve standing on 3rd trial. Required VCs for proper technique    Balance Overall balance assessment: Needs assistance Sitting-balance support: Feet supported Sitting balance-Leahy Scale: Good Sitting balance - Comments: steady sitting reaching within BOS with L UE   Standing balance support: Single extremity supported Standing balance-Leahy Scale: Poor                             ADL either performed or assessed with clinical judgement   ADL Overall ADL's : Needs assistance/impaired                                       General ADL Comments: MAX A for LBD seated EOB. MAX A x2 pericare in standing.               Cognition Arousal/Alertness: Awake/alert Behavior During Therapy: WFL for tasks assessed/performed Overall Cognitive Status: Within Functional Limits for tasks assessed                                 General Comments: Pt able to recall shoulder precautions        Exercises Exercises: Other exercises Other Exercises Other Exercises: Pt and daughter educated re: OT role, d/c recs, funcitonal application of shoulder NWBing pcns, falls prevention Other Exercises: Toileting, sit<>stand, sitting/standing balance/tolerance  Pertinent Vitals/ Pain       Pain Assessment: Faces Pain Score: 8  Faces Pain Scale: Hurts little more Pain Location: R shoulder Pain Descriptors / Indicators: Aching;Sore Pain Intervention(s): Limited activity within patient's tolerance;Repositioned         Frequency  Min 1X/week        Progress Toward Goals  OT Goals(current goals can now be found in the care plan section)  Progress towards OT goals: Progressing toward goals  Acute Rehab OT Goals Patient Stated Goal: to go home OT Goal Formulation: With patient Time For Goal Achievement: 06/04/20 Potential to Achieve Goals: Fair ADL Goals Pt Will Perform Grooming: with min guard  assist;standing Pt Will Transfer to Toilet: with supervision;stand pivot transfer;bedside commode Pt Will Perform Toileting - Clothing Manipulation and hygiene: with min guard assist;sit to/from stand  Plan Discharge plan remains appropriate;Frequency remains appropriate       AM-PAC OT "6 Clicks" Daily Activity     Outcome Measure   Help from another person eating meals?: None Help from another person taking care of personal grooming?: A Little Help from another person toileting, which includes using toliet, bedpan, or urinal?: A Lot Help from another person bathing (including washing, rinsing, drying)?: A Lot Help from another person to put on and taking off regular upper body clothing?: A Lot Help from another person to put on and taking off regular lower body clothing?: A Lot 6 Click Score: 15    End of Session Equipment Utilized During Treatment: Gait belt  OT Visit Diagnosis: Unsteadiness on feet (R26.81);Muscle weakness (generalized) (M62.81);History of falling (Z91.81)   Activity Tolerance Patient tolerated treatment well   Patient Left in chair;with call bell/phone within reach;with chair alarm set;with nursing/sitter in room;with family/visitor present   Nurse Communication          Time: 1033-1100 OT Time Calculation (min): 27 min  Charges: OT General Charges $OT Visit: 1 Visit OT Treatments $Self Care/Home Management : 23-37 mins  Dessie Coma, M.S. OTR/L  05/29/20, 12:53 PM  ascom [phone]

## 2020-05-29 NOTE — Progress Notes (Signed)
Patient ID: Amy [name], female   DOB: January 04, 1928, 87 y.o.   MRN: [id number] [facility] PROGRESS NOTE  Amy [name] VOJ:[id number] DOB: 1928-01-02 DOA: 05/18/2020 PCP: Alvester Morin, MD  HPI/Subjective: Patient feeling a little bit better today.  She states she feels like she is breathing a little bit better.  Her foot and leg pains are less than previous.  Patient initially admitted on 3/25 with shortness of breath and found to have multifocal pneumonia  Objective: Vitals:   05/29/20 1107 05/29/20 1111  BP: (!) 190/65   Pulse: 71   Resp: 20   Temp:  97.7 F (36.5 C)  SpO2: 93%     Intake/Output Summary (Last 24 hours) at 05/29/2020 1315 Last data filed at 05/29/2020 0950 Gross per 24 hour  Intake 1300 ml  Output --  Net 1300 ml   Filed Weights   05/26/20 0625 05/27/20 0500 05/28/20 0424  Weight: 82.8 kg 82.9 kg 81.3 kg    ROS: Review of Systems  Respiratory: Positive for cough and shortness of breath.   Cardiovascular: Negative for chest pain.  Gastrointestinal: Negative for abdominal pain, nausea and vomiting.   Exam: Physical Exam HENT:     Head: Normocephalic.     Mouth/Throat:     Pharynx: No oropharyngeal exudate.  Eyes:     General: Lids are normal.     Conjunctiva/sclera: Conjunctivae normal.     Pupils: Pupils are equal, round, and reactive to light.  Cardiovascular:     Rate and Rhythm: Normal rate and regular rhythm.     Heart sounds: Normal heart sounds, S1 normal and S2 normal.  Pulmonary:     Breath sounds: Examination of the left-middle field reveals decreased breath sounds. Examination of the right-lower field reveals decreased breath sounds and rhonchi. Examination of the left-lower field reveals decreased breath sounds and rhonchi. Decreased breath sounds and rhonchi present. No rales.  Abdominal:     Palpations: Abdomen is soft.     Tenderness: There is no abdominal tenderness.  Musculoskeletal:     Right ankle:  Swelling present.     Left ankle: Swelling present.  Skin:    General: Skin is warm.     Findings: No rash.  Neurological:     Mental Status: She is alert.     Comments: Patient answering questions appropriately       Data Reviewed: Basic Metabolic Panel: Recent Labs  Lab 05/24/20 0555 05/25/20 0731 05/26/20 0432 05/27/20 0543 05/28/20 0646 05/29/20 1010  NA 143 141 143 144 145 142  K 4.4 4.5 4.2 4.2 4.3 5.1  CL 111 112* 113* 113* 114* 113*  CO2 22 22 26 24 26  20*  GLUCOSE 97 112* 126* 138* 119* 118*  BUN 43* 42* 41* 35* 37* 41*  CREATININE 1.84* 1.74* 1.49* 1.39* 1.30* 1.29*  CALCIUM 8.4* 8.3* 8.2* 8.2* 8.2* 8.3*  MG 1.8 1.9 1.8 1.8 1.9  --    CBC: Recent Labs  Lab 05/24/20 0555 05/25/20 0600 05/26/20 0432 05/27/20 0543 05/28/20 0646  WBC 15.2* 13.3* 13.9* 12.3* 11.0*  HGB 8.5* 8.8* 9.0* 9.6* 9.9*  HCT 27.6* 27.9* 28.8* 30.3* 31.6*  MCV 95.8 94.9 95.0 93.8 94.3  PLT 205 190 219 235 263   BNP (last 3 results) Recent Labs    04/23/20 0530 05/18/20 1110 05/22/20 0052  BNP 370.5* 748.3* 939.2*    CBG: Recent Labs  Lab 05/23/20 0751  GLUCAP 78    Recent Results (from the past 240  hour(s))  MRSA PCR Screening     Status: None   Collection Time: 05/23/20  8:16 AM   Specimen: Nasal Mucosa; Nasopharyngeal  Result Value Ref Range Status   MRSA by PCR NEGATIVE NEGATIVE Final    Comment:        The GeneXpert MRSA Assay (FDA approved for NASAL specimens only), is one component of a comprehensive MRSA colonization surveillance program. It is not intended to diagnose MRSA infection nor to guide or monitor treatment for MRSA infections. Performed at [facility], [address] [place], [place] [address]   Body fluid culture w Gram Stain     Status: None   Collection Time: 05/24/20 11:58 AM   Specimen: PATH Cytology Pleural fluid  Result Value Ref Range Status   Specimen Description   Final    PLEURAL Performed at [facility], [address], [place], [place] [address]    Special Requests   Final    NONE Performed at [facility], [address] [place], [place] [address]    Gram Stain   Final    FEW WBC PRESENT, PREDOMINANTLY MONONUCLEAR NO ORGANISMS SEEN    Culture   Final    NO GROWTH 3 DAYS Performed at [facility], [place] [address], [place], [place] [address]    Report Status 05/27/2020 FINAL  Final  Body fluid culture w Gram Stain     Status: None (Preliminary result)   Collection Time: 05/28/20 11:00 AM   Specimen: PATH Cytology Pleural fluid  Result Value Ref Range Status   Specimen Description   Final    PLEURAL Performed at [facility], [address] [place], [place] [address]    Special Requests   Final    NONE Performed at [facility], [address] [place], [place] [address]    Gram Stain   Final    RARE WBC PRESENT, PREDOMINANTLY MONONUCLEAR NO ORGANISMS SEEN Performed at [facility], [place] [address], [place], [place] [address]    Culture PENDING  Incomplete   Report Status PENDING  Incomplete     Studies: DG Chest Port 1 View  Result Date: 05/29/2020 CLINICAL DATA:  87 year old female with cough and progressive shortness of breath. Hypoxia on room air. Recent left side thoracentesis. EXAM: PORTABLE CHEST 1 VIEW COMPARISON:  Portable chest 05/28/2020 and earlier. FINDINGS: Portable AP semi upright view at 1021 hours. Improved lung volumes from yesterday. The patient is less rotated now. Stable cardiac size and mediastinal contours. Left lung ventilation remains improved compared to the CT on 05/27/2020, with residual lower lung opacity which is probably a combination of residual effusion and airspace disease. Right lung appears stable. No pneumothorax. Visualized tracheal air column is within normal limits. Stable visualized osseous structures. Un healed proximal right humerus fracture. Paucity of bowel gas in the upper abdomen.  IMPRESSION: 1. Left lung ventilation remains improved since the CT on 05/27/2020 with residual lower lung combined pleural effusion and airspace disease suspected. 2. No new cardiopulmonary abnormality. Electronically Signed   By: Genevie Ann M.D.   On: 05/29/2020 10:55   DG Chest Port 1 View  Result Date: 05/28/2020 CLINICAL DATA:  Status post left-sided thoracentesis EXAM: PORTABLE CHEST 1 VIEW COMPARISON:  CT chest and chest x-ray obtained yesterday FINDINGS: No evidence of pneumothorax following left-sided thoracentesis. Slightly improved aeration of the left upper lung. Persistent dense opacification of the left mid and lower chest which may represent persistent  pleural fluid versus persistent atelectasis. The right lung remains clear. Chronic right proximal humeral fracture again noted. No acute osseous abnormality. Atherosclerotic calcifications present in the transverse aorta. IMPRESSION: 1. No evidence of pneumothorax following left-sided thoracentesis. 2. Improved aeration of the left upper lung but persistent opacification of the left mid and lower lung. Findings may represent residual atelectasis versus residual pleural fluid. Electronically Signed   By: Jacqulynn Cadet M.D.   On: 05/28/2020 11:24   US THORACENTESIS ASP PLEURAL SPACE W/IMG GUIDE  Result Date: 05/28/2020 INDICATION: Patient with history of multifocal pneumonia with left sided pleural effusion. Request is for therapeutic and diagnostic left-sided thoracentesis EXAM: ULTRASOUND GUIDED THERAPEUTIC AND DIAGNOSTIC THORACENTESIS MEDICATIONS: Lidocaine 1% 10 mL COMPLICATIONS: None immediate. PROCEDURE: An ultrasound guided thoracentesis was thoroughly discussed with the patient and questions answered. The benefits, risks, alternatives and complications were also discussed. The patient understands and wishes to proceed with the procedure. Written consent was obtained. Ultrasound was performed to localize and mark an adequate pocket of fluid  in the left chest. The area was then prepped and draped in the normal sterile fashion. 1% Lidocaine was used for local anesthesia. Under ultrasound guidance a 6 Fr Safe-T-Centesis catheter was introduced. Thoracentesis was performed. The catheter was removed and a dressing applied. FINDINGS: A total of approximately 600 mL of straw-colored fluid was removed. Samples were sent to the laboratory as requested by the clinical team. IMPRESSION: Successful ultrasound guided therapeutic and diagnostic left-sided thoracentesis yielding 600 mL of pleural fluid. Read by: Rushie Nyhan, NP Electronically Signed   By: Corrie Mckusick D.O.   On: 05/28/2020 11:33    Scheduled Meds: . atorvastatin  20 mg Oral Daily  . bisacodyl  10 mg Rectal QHS  . Chlorhexidine Gluconate Cloth  6 each Topical Daily  . clopidogrel  75 mg Oral Daily  . dextromethorphan-guaiFENesin  1 tablet Oral BID  . enoxaparin (LOVENOX) injection  30 mg Subcutaneous Q24H  . ipratropium-albuterol  3 mL Nebulization BID  . iron polysaccharides  150 mg Oral Daily  . levETIRAcetam  250 mg Oral BID  . levothyroxine  100 mcg Oral Q0600  . magnesium oxide  400 mg Oral Daily  . mometasone-formoterol  2 puff Inhalation BID  . montelukast  10 mg Oral QHS  . pantoprazole  20 mg Oral Daily  . [START ON 05/30/2020] predniSONE  10 mg Oral Q breakfast  . pregabalin  50 mg Oral BID  . sodium chloride flush  10-40 mL Intracatheter Q12H  . sodium phosphate  1 enema Rectal Daily  . torsemide  10 mg Oral Daily   Brief history.  Patient was admitted on 05/18/2020 with shortness of breath and abdominal pain.  Past medical history of hypertension, hyperlipidemia, type 2 diabetes, COPD, CAD, hypothyroidism, atrial fibrillation, seizure, chronic kidney disease, anemia, CHF.  She was found to have clinical sepsis with multifocal pneumonia and left pleural effusion.  The patient has received 10 days of antibiotics so antibiotics were stopped.  She had a  thoracentesis on 05/24/2020 removing 250 mL of fluid.  Another thoracentesis on 05/28/2020 removing 600 mL of fluid.  Nothing growing on a either culture.  Follow-up cytology of the second thoracentesis.  Repeat chest x-ray little bit better than previous x-rays.  Pulmonary does not want to do another thoracentesis at this point.  Patient was also found to have NSTEMI.  Patient also had acute metabolic encephalopathy and was barely responsive 5 days ago.  Patient also has bilateral feet  ankle and leg pain and her Lyrica was restarted and slowly improving.  I did put on low-dose prednisone also.  Assessment/Plan:  1. Severe sepsis, present on admission with multifocal pneumonia and left pleural effusion.  The patient had a thoracentesis on 05/24/2020 removing 250 mL fluid.  Patient had a repeat thoracentesis on 05/28/2020 removing 600 mL of fluid.  Repeat chest x-ray today did show better air entry but still has pleural effusion.  Pulmonary did not want to repeat thoracentesis at this point.  Follow-up cytology from second thoracentesis.  Restarted low-dose torsemide today.  Patient completed 10 days of antibiotic and is off antibiotics at this time 2. Pain bilateral feet and ankles.  This has improved with restarting Lyrica at a lower dose.  I also gave low-dose prednisone which I will start to taper.  Uric acid was not high. 3. Acute metabolic encephalopathy.  5 days ago the patient was barely responsive.  Repeat CT scan of the head was negative.  Patient's mental status has improved over the last few days. 4. NSTEMI.  Medical management as per cardiology with Plavix and atorvastatin.  Patient was on heparin drip for 2 days at the beginning of the hospital course.  Troponin peaked at 1196. 5. Acute hypoxic respiratory failure.  During the hospital course the patient did have a pulse ox of 85% on room air.  ABG and spirometry did not qualify the patient for BiPAP at night.  Patient is currently off  oxygen. 6. Acute kidney injury on chronic kidney disease stage IIIb.  Creatinine peaked at 1.84 and is down to 1.29 today.  Watch closely with restarting of torsemide low-dose. 7. Essential hypertension restarting low-dose torsemide.  Restart low-dose Norvasc. 8. Paroxysmal atrial fibrillation.  Not on any anticoagulation secondary to GI bleed history 9. History of seizure continue Keppra 10. Iron deficiency anemia on oral iron supplementation 11. Weakness.  Physical therapy recommending rehab     Code Status:     Code Status Orders  (From admission, onward)         Start     Ordered   05/18/20 1439  Do not attempt resuscitation (DNR)  Continuous       Question Answer Comment  In the event of cardiac or respiratory ARREST Do not call a "code blue"   In the event of cardiac or respiratory ARREST Do not perform Intubation, CPR, defibrillation or ACLS   In the event of cardiac or respiratory ARREST Use medication by any route, position, wound care, and other measures to relive pain and suffering. May use oxygen, suction and manual treatment of airway obstruction as needed for comfort.      05/18/20 1439        Code Status History    Date Active Date Inactive Code Status Order ID Comments User Context   04/23/2020 1851 04/24/2020 2335 DNR [id number]  Ivor Costa, MD Inpatient   03/06/2020 1332 03/08/2020 2259 DNR [id number]  Gwynne Edinger, MD ED   02/02/2020 2244 02/14/2020 1927 DNR [id number]  Rhetta Mura, DO ED   02/02/2020 1745 02/02/2020 2244 DNR [id number]  Rhetta Mura, DO ED   08/18/2019 1932 08/23/2019 2056 DNR [id number]  Athena Masse, MD ED   08/12/2019 1610 08/15/2019 1935 DNR [id number]  Ivor Costa, MD ED   08/12/2019 1447 08/12/2019 1610 DNR [id number]  Merlyn Lot, MD ED   08/06/2019 1455 08/07/2019 1551 DNR [id number]  Max Sane, MD ED   05/16/2017 0217 05/20/2017 1758 DNR  [id number]  Lance Coon, MD Inpatient   09/21/2016 1245 09/26/2016 1836 DNR [id number]   [name], MD ED   10/01/2015 1144 10/02/2015 0748 Full Code [id number]  Bettey Costa, MD Inpatient   Advance Care Planning Activity     Family Communication: Spoke with daughter on the phone Disposition Plan: Status is: Inpatient  Dispo: The patient is from: Rehab              Anticipated d/c is to: Rehab.  Unfortunately the place that she was at for rehab does not have a bed for her at this point.  Transitional care looking into other options.              Patient currently can potentially go out to rehab once another option is obtained   Difficult to place patient.  Potentially.  Transitional care team needs to look into another option.  Time spent: 27 minutes  [place]

## 2020-05-29 NOTE — Plan of Care (Signed)

## 2020-05-30 ENCOUNTER — Inpatient Hospital Stay: Payer: Medicare Other

## 2020-05-30 DIAGNOSIS — J189 Pneumonia, unspecified organism: Secondary | ICD-10-CM | POA: Diagnosis not present

## 2020-05-30 LAB — CBC WITH DIFFERENTIAL/PLATELET
Abs Immature Granulocytes: 0.16 10*3/uL — ABNORMAL HIGH (ref 0.00–0.07)
Basophils Absolute: 0.1 10*3/uL (ref 0.0–0.1)
Basophils Relative: 1 %
Eosinophils Absolute: 0.2 10*3/uL (ref 0.0–0.5)
Eosinophils Relative: 1 %
HCT: 43.1 % (ref 36.0–46.0)
Hemoglobin: 13.2 g/dL (ref 12.0–15.0)
Immature Granulocytes: 1 %
Lymphocytes Relative: 18 %
Lymphs Abs: 2.2 10*3/uL (ref 0.7–4.0)
MCH: 29.3 pg (ref 26.0–34.0)
MCHC: 30.6 g/dL (ref 30.0–36.0)
MCV: 95.8 fL (ref 80.0–100.0)
Monocytes Absolute: 1.2 10*3/uL — ABNORMAL HIGH (ref 0.1–1.0)
Monocytes Relative: 10 %
Neutro Abs: 8.4 10*3/uL — ABNORMAL HIGH (ref 1.7–7.7)
Neutrophils Relative %: 69 %
Platelets: 350 10*3/uL (ref 150–400)
RBC: 4.5 MIL/uL (ref 3.87–5.11)
RDW: 14.8 % (ref 11.5–15.5)
WBC: 12.1 10*3/uL — ABNORMAL HIGH (ref 4.0–10.5)
nRBC: 0 % (ref 0.0–0.2)

## 2020-05-30 LAB — BASIC METABOLIC PANEL
Anion gap: 5 (ref 5–15)
BUN: 42 mg/dL — ABNORMAL HIGH (ref 8–23)
CO2: 24 mmol/L (ref 22–32)
Calcium: 8.1 mg/dL — ABNORMAL LOW (ref 8.9–10.3)
Chloride: 113 mmol/L — ABNORMAL HIGH (ref 98–111)
Creatinine, Ser: 1.2 mg/dL — ABNORMAL HIGH (ref 0.44–1.00)
GFR, Estimated: 43 mL/min — ABNORMAL LOW (ref >60)
Glucose, Bld: 107 mg/dL — ABNORMAL HIGH (ref 70–99)
Potassium: 4.9 mmol/L (ref 3.5–5.1)
Sodium: 142 mmol/L (ref 135–145)

## 2020-05-30 MED ORDER — LIDOCAINE 5 % EX PTCH
1.0000 | MEDICATED_PATCH | CUTANEOUS | Status: DC
  Administered 2020-05-30 – 2020-06-02 (×4): 1 via TRANSDERMAL
  Filled 2020-05-30 (×5): qty 1

## 2020-05-30 MED ORDER — PREGABALIN 50 MG PO CAPS
100.0000 mg | ORAL_CAPSULE | Freq: Every day | ORAL | Status: DC
  Administered 2020-05-30 – 2020-06-01 (×3): 100 mg via ORAL
  Filled 2020-05-30 (×3): qty 2

## 2020-05-30 MED ORDER — PREGABALIN 50 MG PO CAPS
50.0000 mg | ORAL_CAPSULE | Freq: Every morning | ORAL | Status: DC
  Administered 2020-05-31 – 2020-06-02 (×3): 50 mg via ORAL
  Filled 2020-05-30 (×3): qty 1

## 2020-05-30 NOTE — Progress Notes (Signed)
Pulmonary Medicine          Date: 05/30/2020,   MRN# [id number] Amy [name] [birth year]     AdmissionWeight: 86.2 kg                 CurrentWeight: 81.3 kg   Referring physician: Dr Earleen Newport    CHIEF COMPLAINT:   Multifocal Pneumonia with parapneumonic effusion.   HISTORY OF PRESENT ILLNESS   As per admission h/p Patient states that she started having abdominal pain which was located in the left side of the abdomen, associated with nausea and diarrhea.  She has had 3-4 times of loose stool bowel movement.  She also had fever of 101 in facility, currently body temperature is 97.5 in ED. Patient has cough and shortness of breath which has been progressively worsening.  She was found to have oxygen desaturation to 85% on room air, which improved to 94% on 5 L oxygen.  Denies chest pain.  No rectal bleeding or dark stool.  Pt was found to have WBC 4.7, lactic acid 2.9, 1.5, INR 1.1, PTT 31, troponin level 638, 933, positive Covid PCR, renal function close to baseline, temperature 97.5, softer blood pressure 93/39, heart rate 98, RR 28.  Chest x-ray showed multifocal infiltration mainly in the left side, vascular congestion and cardiomegaly.  CT of the chest/abdomen/pelvis showed left lower lobe infiltration,  no acute issues intra-abdominally. After 9 day pneumonia tx and thoracentesis patient still has bilateral infiltrates and PCCM consultation placed for additional evaluation and management.   05/28/20- Patient is s/p thoracentesis, she feels better already. She is working with [employer] therapy and feels that after first session she is already breathing better.  She had OT today. We discussed using incentive spiroemetry.  I discussed her care plan with Dr Leslye Peer today.   05/29/20- repeat CXR this am with residual effusion on left and bibasilar atelectasis.  We discussed possibly needing repeat thoracentesis.  She is on room air , daughter in room. I reviwed plan with  family , Respiratory therapist Demetria and Attending physician Dr Leslye Peer. We will focus on continuing medical mgt, placement, PT/OT and outpatient follow up.   05/30/20- patient examined at bedside. She continues to be with SOB and hypoxemia on 2L/min which is new for her. She does still have fluid around left lung and felt most improved post thoracentesis. She used Metaneb and waited another 24h with diuresis but has not been able to improve further. We agreed on repeat thoracentesis today, patient may sign her own consent   PAST MEDICAL HISTORY   Past Medical History:  Diagnosis Date  . Bladder incontinence   . CAD (coronary artery disease)    a. 05/2009 Cath: LAD 90p (Xience 2.75 x 12 mm DES), 75m D1 40, LCx 40p/m, RCA 30/40/30p/m, RCA 30/25d;  b.  12/2011 Lexiscan MV: no ischemia, breast attenuation artifact, normal EF-->Low risk; c. 10/2016 MV: fixed apical defect, most likely apical thinning and attenuation, No ischemia, EF 60%.  . [place] [place]    ov[place]  . Carotid arterial disease w/ R Carotid Bruit (HCC)    a. 08/2016 Carotid U/S: 40-59% bilat ICA stenosis - f/u 1 yr.  . Chronic diastolic (congestive) heart failure ([facility]    a. Echo 09/2015: EF 55-60% w/ Grade 1 DD, sev Ca2+ MV annulus, mildly dil LA; b. 09/2016 Echo: EF 65-70%, Gr2 DD, mildly dil LA/RA, nl RV fxn.  . Chronic Dyspnea on exertion   . CKD (chronic  kidney disease) stage 3, GFR 30-59 ml/min (HCC)   . Degenerative arthritis of knee    bilateral knees  . Diabetes mellitus    Type II  . GIB (gastrointestinal bleeding)    a. 08/2016 Admission w/ presyncope/anemia/melena-->req Transfusion-->endo ok, colonoscopy w/ polyps but no source of bleeding (most likely diverticular).  . Hiatal hernia   . Hypertension   . Iron deficiency   . Menopausal symptoms   . Morbid obesity ([place])   . Renal insufficiency   . Thyroid disease    hypothyroidism     SURGICAL HISTORY   Past Surgical History:  Procedure Laterality Date  .  COLONOSCOPY  2015  . COLONOSCOPY WITH PROPOFOL N/A 09/25/2016   Procedure: COLONOSCOPY WITH PROPOFOL;  Surgeon: [name], MD;  Location[facility] ENDOSCOPY;  Service: Gastroenterology;  Laterality: N/A;  . COLONOSCOPY WITH PROPOFOL N/A 09/26/2016   Procedure: COLONOSCOPY WITH PROPOFOL;  Surgeon: [name], MD;  Location: [facility] ENDOSCOPY;  Service: Gastroenterology;  Laterality: N/A;  . ESOPHAGOGASTRODUODENOSCOPY (EGD) WITH PROPOFOL N/A 09/25/2016   Procedure: ESOPHAGOGASTRODUODENOSCOPY (EGD) WITH PROPOFOL;  Surgeon: [name], MD;  Location: [facility] ENDOSCOPY;  Service: Gastroenterology;  Laterality: N/A;  . RENAL ANGIOGRAPHY N/A 02/07/2019   Procedure: RENAL ANGIOGRAPHY;  Surgeon: [name], MD;  Location: [facility] CV LAB;  Service: Cardiovascular;  Laterality: N/A;  . REPLACEMENT TOTAL KNEE BILATERAL    . TOTAL VAGINAL HYSTERECTOMY     ovarian mass, not cancerous  . UPPER GI ENDOSCOPY  2015     FAMILY HISTORY   Family History  Problem Relation Age of Onset  . Breast cancer Mother 29     SOCIAL HISTORY   Social History   Tobacco Use  . Smoking status: Never Smoker  . Smokeless tobacco: Never Used  Vaping Use  . Vaping Use: Never used  Substance Use Topics  . Alcohol use: No  . Drug use: No     MEDICATIONS    Home Medication:    Current Medication:  Current Facility-Administered Medications:  .  acetaminophen (TYLENOL) tablet 650 mg, 650 mg, Oral, Q6H PRN, Oswald Hillock, RPH, 650 mg at 05/30/20 1117 .  albuterol (PROVENTIL) (2.5 MG/3ML) 0.083% nebulizer solution 2.5 mg, 2.5 mg, Nebulization, Q4H PRN, Ivor Costa, MD .  amLODipine (NORVASC) tablet 2.5 mg, 2.5 mg, Oral, Daily, Leslye Peer, Richard, MD, 2.5 mg at 05/30/20 0839 .  atorvastatin (LIPITOR) tablet 20 mg, 20 mg, Oral, Daily, Oswald Hillock, RPH, 20 mg at 05/29/20 2014 .  bisacodyl (DULCOLAX) suppository 10 mg, 10 mg, Rectal, QHS, Wieting, Richard, MD, 10 mg at 05/25/20 2050 .  Chlorhexidine Gluconate Cloth 2  % PADS 6 each, 6 each, Topical, Daily, Loletha Grayer, MD, 6 each at 05/30/20 [phone]  clopidogrel (PLAVIX) tablet 75 mg, 75 mg, Oral, Daily, Ivor Costa, MD, 75 mg at 05/30/20 0839 .  dextromethorphan-guaiFENesin (MUCINEX DM) 30-600 MG per 12 hr tablet 1 tablet, 1 tablet, Oral, BID, Wyvonnia Dusky, MD, 1 tablet at 05/30/20 [phone]  docusate sodium (COLACE) capsule 100 mg, 100 mg, Oral, BID PRN, Ivor Costa, MD .  enoxaparin (LOVENOX) injection 30 mg, 30 mg, Subcutaneous, Q24H, Wieting, Richard, MD, 30 mg at 05/29/20 2013 .  ipratropium-albuterol (DUONEB) 0.5-2.5 (3) MG/3ML nebulizer solution 3 mL, 3 mL, Nebulization, BID, Wyvonnia Dusky, MD, 3 mL at 05/30/20 0109 .  iron polysaccharides (NIFEREX) capsule 150 mg, 150 mg, Oral, Daily, Ivor Costa, MD, 150 mg at 05/30/20 0840 .  levETIRAcetam (KEPPRA) tablet 250 mg,  250 mg, Oral, BID, Oswald Hillock, RPH, 250 mg at 05/30/20 0840 .  levothyroxine (SYNTHROID) tablet 100 mcg, 100 mcg, Oral, Q0600, Wyvonnia Dusky, MD, 100 mcg at 05/30/20 0425 .  lidocaine (LIDODERM) 5 % 1 patch, 1 patch, Transdermal, Q24H, Sreenath, Sudheer B, MD, 1 patch at 05/30/20 1238 .  magnesium oxide (MAG-OX) tablet 400 mg, 400 mg, Oral, Daily, Leslye Peer, Richard, MD, 400 mg at 05/30/20 0839 .  melatonin tablet 5 mg, 5 mg, Oral, QHS PRN, Ivor Costa, MD, 5 mg at 05/20/20 2108 .  mometasone-formoterol (DULERA) 200-5 MCG/ACT inhaler 2 puff, 2 puff, Inhalation, BID, Ivor Costa, MD, 2 puff at 05/30/20 [phone]  montelukast (SINGULAIR) tablet 10 mg, 10 mg, Oral, QHS, Ivor Costa, MD, 10 mg at 05/29/20 2014 .  nitroGLYCERIN (NITROSTAT) SL tablet 0.4 mg, 0.4 mg, Sublingual, Q5 Min x 3 PRN, Ivor Costa, MD .  ondansetron [facility]) injection 4 mg, 4 mg, Intravenous, Q8H PRN, Ivor Costa, MD .  pantoprazole (PROTONIX) EC tablet 20 mg, 20 mg, Oral, Daily, Oswald Hillock, RPH, 20 mg at 05/30/20 0840 .  polyethylene glycol (MIRALAX / GLYCOLAX) packet 17 g, 17 g, Oral, Daily PRN, Ivor Costa, MD .  predniSONE (DELTASONE) tablet 10 mg, 10 mg, Oral, Q breakfast, Leslye Peer, Richard, MD, 10 mg at 05/30/20 0839 .  pregabalin (LYRICA) capsule 50 mg, 50 mg, Oral, q AM **AND** pregabalin (LYRICA) capsule 100 mg, 100 mg, Oral, QHS, Sreenath, Sudheer B, MD .  sodium chloride flush (NS) 0.9 % injection 10-40 mL, 10-40 mL, Intracatheter, Q12H, Gollan, Kathlene November, MD, 10 mL at 05/30/20 0844 .  sodium chloride flush (NS) 0.9 % injection 10-40 mL, 10-40 mL, Intracatheter, PRN, Rockey Situ, Kathlene November, MD .  sodium phosphate (FLEET) 7-19 GM/118ML enema 1 enema, 1 enema, Rectal, Daily, Loletha Grayer, MD, 1 enema at 05/25/20 0901 .  torsemide (DEMADEX) tablet 10 mg, 10 mg, Oral, Daily, Leslye Peer, Richard, MD, 10 mg at 05/30/20 0839 .  traMADol (ULTRAM) tablet 50 mg, 50 mg, Oral, Q12H PRN, Wyvonnia Dusky, MD, 50 mg at 05/29/20 2014  Facility-Administered Medications Ordered in Other Encounters:  .  0.9 %  sodium chloride infusion, , Intravenous, Continuous, Finnegan, Kathlene November, MD    ALLERGIES   Atenolol, Codeine, Losartan, Morphine and related, Fentanyl, Nsaids, Rofecoxib, Sucralfate, Sulfa antibiotics, and Tramadol     REVIEW OF SYSTEMS    Review of Systems:  Gen:  Denies  fever, sweats, chills weigh loss  HEENT: Denies blurred vision, double vision, ear pain, eye pain, hearing loss, nose bleeds, sore throat Cardiac:  No dizziness, chest pain or heaviness, chest tightness,edema Resp:   Denies cough or sputum porduction, shortness of breath,wheezing, hemoptysis,  Gi: Denies swallowing difficulty, stomach pain, nausea or vomiting, diarrhea, constipation, bowel incontinence Gu:  Denies bladder incontinence, burning urine Ext:   Denies Joint pain, stiffness or swelling Skin: Denies  skin rash, easy bruising or bleeding or hives Endoc:  Denies polyuria, polydipsia , polyphagia or weight change Psych:   Denies depression, insomnia or hallucinations   Other:  All other systems  negative   VS: BP 133/77 (BP Location: Left Leg)   Pulse (!) 59   Temp 98.2 F (36.8 C)   Resp 18   Ht _0  (1.575 m)   Wt 81.3 kg   SpO2 100%   BMI 32.78 kg/m      PHYSICAL EXAM    GENERAL:NAD, no fevers, chills, no weakness no fatigue HEAD: Normocephalic, atraumatic.  EYES:  Pupils equal, round, reactive to light. Extraocular muscles intact. No scleral icterus.  MOUTH: Moist mucosal membrane. Dentition intact. No abscess noted.  EAR, NOSE, THROAT: Clear without exudates. No external lesions.  NECK: Supple. No thyromegaly. No nodules. No JVD.  PULMONARY: decreased breath sounds on left  CARDIOVASCULAR: S1 and S2. Regular rate and rhythm. No murmurs, rubs, or gallops. No edema. Pedal pulses 2+ bilaterally.  GASTROINTESTINAL: Soft, nontender, nondistended. No masses. Positive bowel sounds. No hepatosplenomegaly.  MUSCULOSKELETAL: No swelling, clubbing, or edema. Range of motion full in all extremities.  NEUROLOGIC: Cranial nerves II through XII are intact. No gross focal neurological deficits. Sensation intact. Reflexes intact.  SKIN: No ulceration, lesions, rashes, or cyanosis. Skin warm and dry. Turgor intact.  PSYCHIATRIC: Mood, affect within normal limits. The patient is awake, alert and oriented x 3. Insight, judgment intact.       IMAGING    CT HEAD WO CONTRAST  Result Date: 05/24/2020 CLINICAL DATA:  Mental status change EXAM: CT HEAD WITHOUT CONTRAST TECHNIQUE: Contiguous axial images were obtained from the base of the skull through the vertex without intravenous contrast. COMPARISON:  05/22/2020 FINDINGS: Brain: There is no acute intracranial hemorrhage, mass effect, or edema. No new loss of gray-white differentiation. Small chronic left cerebellar infarct. Patchy hypoattenuation in the supratentorial white matter likely reflects stable chronic microvascular ischemic changes. There is no extra-axial fluid collection. Ventricles and sulci are stable in size and  configuration. Vascular: There is atherosclerotic calcification at the skull base. Skull: Calvarium is unremarkable. Sinuses/Orbits: Mild mucosal thickening. No acute orbital abnormality. Other: None. IMPRESSION: No acute intracranial abnormality. No significant change since recent prior study. Electronically Signed   By: Macy Mis M.D.   On: 05/24/2020 17:14   CT CHEST WO CONTRAST  Result Date: 05/27/2020 CLINICAL DATA:  Pneumonia.  Abnormal chest radiograph EXAM: CT CHEST WITHOUT CONTRAST TECHNIQUE: Multidetector CT imaging of the chest was performed following the standard protocol without IV contrast. COMPARISON:  Chest CT 05/18/2020, radiograph 05/27/2020 FINDINGS: Cardiovascular: Valvular calcification. Coronary artery calcification and aortic atherosclerotic calcification. Mediastinum/Nodes: 11 mm RIGHT lower paratracheal lymph node. Lungs/Pleura: Large volume LEFT pleural effusion occupying the near entirety of the LEFT hemithorax. There is passive atelectasis with complete atelectasis of the LEFT lower lobe and lingula. Small volume of LEFT upper lobe is aerated. Small moderate RIGHT pleural effusion with mild passive atelectasis. Upper Abdomen: Limited view of the liver, kidneys, pancreas are unremarkable. Normal adrenal glands. Musculoskeletal: No aggressive osseous lesion. Degenerative osteophytosis of the spine. IMPRESSION: 1. Progressive large LEFT pleural effusion now occupies the near entirety of the LEFT hemithorax. Complete passive atelectasis of the LEFT lower lobe and lingula. Minimal aeration of the LEFT upper lobe. 2. Small to moderate RIGHT pleural effusion. 3. Coronary artery calcification and Aortic Atherosclerosis (ICD10-I70.0). Electronically Signed   By: Suzy Bouchard M.D.   On: 05/27/2020 18:06   DG Chest Port 1 View  Result Date: 05/29/2020 CLINICAL DATA:  87 year old female with cough and progressive shortness of breath. Hypoxia on room air. Recent left side thoracentesis.  EXAM: PORTABLE CHEST 1 VIEW COMPARISON:  Portable chest 05/28/2020 and earlier. FINDINGS: Portable AP semi upright view at 1021 hours. Improved lung volumes from yesterday. The patient is less rotated now. Stable cardiac size and mediastinal contours. Left lung ventilation remains improved compared to the CT on 05/27/2020, with residual lower lung opacity which is probably a combination of residual effusion and airspace disease. Right lung appears stable. No pneumothorax. Visualized tracheal air column  is within normal limits. Stable visualized osseous structures. Un healed proximal right humerus fracture. Paucity of bowel gas in the upper abdomen. IMPRESSION: 1. Left lung ventilation remains improved since the CT on 05/27/2020 with residual lower lung combined pleural effusion and airspace disease suspected. 2. No new cardiopulmonary abnormality. Electronically Signed   By: Genevie Ann M.D.   On: 05/29/2020 10:55   DG Chest Port 1 View  Result Date: 05/28/2020 CLINICAL DATA:  Status post left-sided thoracentesis EXAM: PORTABLE CHEST 1 VIEW COMPARISON:  CT chest and chest x-ray obtained yesterday FINDINGS: No evidence of pneumothorax following left-sided thoracentesis. Slightly improved aeration of the left upper lung. Persistent dense opacification of the left mid and lower chest which may represent persistent pleural fluid versus persistent atelectasis. The right lung remains clear. Chronic right proximal humeral fracture again noted. No acute osseous abnormality. Atherosclerotic calcifications present in the transverse aorta. IMPRESSION: 1. No evidence of pneumothorax following left-sided thoracentesis. 2. Improved aeration of the left upper lung but persistent opacification of the left mid and lower lung. Findings may represent residual atelectasis versus residual pleural fluid. Electronically Signed   By: Jacqulynn Cadet M.D.   On: 05/28/2020 11:24   DG Chest Port 1 View  Result Date: 05/27/2020 CLINICAL  DATA:  Pneumonia. EXAM: PORTABLE CHEST 1 VIEW COMPARISON:  05/24/2020; 05/22/2020 FINDINGS: Grossly unchanged cardiac silhouette and mediastinal contours with persistent obscuration of the right heart border secondary to unchanged left-sided pleural effusion and associated left mid lower lung consolidative opacities, similar to the 05/22/2020 examination. Atherosclerotic plaque within the aortic arch and mitral valve calcifications. Mild pulmonary venous congestion within the otherwise well aerated right lung. No definite right-sided pleural effusion though the right costophrenic angle is excluded from view. No acute osseous abnormalities. IMPRESSION: 1. Similar appearing extensive left lung consolidative opacities and small left-sided effusion worrisome for multifocal pneumonia. 2. Suspected pulmonary venous congestion within the otherwise well aerated right lung. Electronically Signed   By: Sandi Mariscal M.D.   On: 05/27/2020 09:30   DG Chest Port 1 View  Result Date: 05/24/2020 CLINICAL DATA:  Post left-sided thoracentesis. EXAM: PORTABLE CHEST 1 VIEW COMPARISON:  Chest radiograph-05/22/2020; 05/18/2020; chest CT-05/18/2020 FINDINGS: Grossly unchanged cardiac silhouette and mediastinal contours with atherosclerotic plaque within thoracic aorta. Dystrophic calcifications within the mitral valve annulus. Interval reduction/near resolution of left-sided pleural effusion post thoracentesis with improved aeration of the left lung apex. Redemonstrated extensive left mid and lower lung heterogeneous/consolidative opacities with associated left-sided volume loss and deviation of the cardiomediastinal structures to the left. Pulmonary vasculature within the right lung remains indistinct. No definite right-sided pleural effusion. No acute osseous abnormalities. Redemonstrated chronic displaced fracture involving the anatomic neck of the right humerus, incompletely evaluated. IMPRESSION: 1. Interval reduction/near  resolution of left-sided pleural effusion post thoracentesis. No pneumothorax. 2. Otherwise, similar findings of extensive left mid and lower lung heterogeneous/consolidative opacities with associated volume loss. Electronically Signed   By: Sandi Mariscal M.D.   On: 05/24/2020 12:15   DG Chest Port 1 View  Result Date: 05/22/2020 CLINICAL DATA:  Hypoxia EXAM: PORTABLE CHEST 1 VIEW COMPARISON:  05/18/2020 FINDINGS: Large left pleural effusion with worsening left upper consolidation. Chronic right humeral head fracture. Mild right chest opacity. IMPRESSION: Large left pleural effusion with worsening left upper consolidation. Electronically Signed   By: Ulyses Jarred M.D.   On: 05/22/2020 01:44   DG Chest Port 1 View  Result Date: 05/18/2020 CLINICAL DATA:  Shortness of breath and fever EXAM: PORTABLE CHEST  1 VIEW COMPARISON:  March 06, 2020 FINDINGS: There is extensive airspace opacity throughout most of the left lung. The right lung is clear. There is cardiomegaly with mild pulmonary venous hypertension. No adenopathy. There is aortic atherosclerosis. There is calcification in each carotid artery. No bone lesions. IMPRESSION: Widespread airspace opacity throughout much of the left lung consistent with multifocal pneumonia. Right lung clear. There is cardiomegaly with a degree of pulmonary vascular congestion. There is calcification in each carotid artery. Aortic Atherosclerosis (ICD10-I70.0). Electronically Signed   By: Lowella Grip III M.D.   On: 05/18/2020 09:08   CT HEAD CODE STROKE WO CONTRAST`  Result Date: 05/22/2020 CLINICAL DATA:  Code stroke.  Acute neurologic deficit EXAM: CT HEAD WITHOUT CONTRAST TECHNIQUE: Contiguous axial images were obtained from the base of the skull through the vertex without intravenous contrast. COMPARISON:  None. FINDINGS: Brain: There is no mass, hemorrhage or extra-axial collection. The size and configuration of the ventricles and extra-axial CSF spaces are  normal. There is hypoattenuation of the periventricular white matter, most commonly indicating chronic ischemic microangiopathy. Vascular: No abnormal hyperdensity of the major intracranial arteries or dural venous sinuses. No intracranial atherosclerosis. Skull: The visualized skull base, calvarium and extracranial soft tissues are normal. Sinuses/Orbits: No fluid levels or advanced mucosal thickening of the visualized paranasal sinuses. No mastoid or middle ear effusion. The orbits are normal. ASPECTS [facility] Stroke Program Early CT Score) - Ganglionic level infarction (caudate, lentiform nuclei, internal capsule, insula, M1-M3 cortex): 7 - Supraganglionic infarction (M4-M6 cortex): 3 Total score (0-10 with 10 being normal): 10 IMPRESSION: 1. Chronic ischemic microangiopathy without acute intracranial abnormality. 2. ASPECTS is 10. These results were called by telephone at the time of interpretation on 05/22/2020 at 12:35 am to provider [facility] , who verbally acknowledged these results. Electronically Signed   By: Ulyses Jarred M.D.   On: 05/22/2020 00:44   ECHOCARDIOGRAM LIMITED  Result Date: 05/23/2020    ECHOCARDIOGRAM LIMITED REPORT   Patient Name:   Amy [name] Date of Exam: 05/23/2020 Medical Rec #:  [id number]          Height:       62.0 in Accession #:    [id number]         Weight:       181.8 lb Date of Birth:  May 01, 1927          BSA:          1.836 m Patient Age:    8 years           BP:           133/36 mmHg Patient Gender: F                  HR:           56 bpm. Exam Location:  [facility] Procedure: Limited Echo, Color Doppler and Cardiac Doppler Indications:     Chest pain R07.9                  NSTEMI I21.4  History:         Patient has prior history of Echocardiogram examinations, most                  recent 02/07/2020. Risk Factors:Hypertension and Diabetes.  Sonographer:     Sherrie Sport RDCS (AE) Referring Phys:  [id number] Arvil Chaco Diagnosing Phys: Kathlyn Sacramento MD  IMPRESSIONS  1. Left ventricular ejection fraction, by estimation, is 55 to  60%. The left ventricle has normal function. Left ventricular endocardial border not optimally defined to evaluate regional wall motion. Left ventricular diastolic function could not be evaluated.  2. Right ventricular systolic function is normal. The right ventricular size is normal. Tricuspid regurgitation signal is inadequate for assessing PA pressure.  3. Left atrial size was mildly dilated.  4. Moderate pleural effusion.  5. The mitral valve is abnormal. No evidence of mitral valve regurgitation. No evidence of mitral stenosis. Moderate mitral annular calcification.  6. The aortic valve is normal in structure. Aortic valve regurgitation is not visualized. Mild aortic valve stenosis. No gradients were measured.  7. limited and challenging study. FINDINGS  Left Ventricle: Left ventricular ejection fraction, by estimation, is 55 to 60%. The left ventricle has normal function. Left ventricular endocardial border not optimally defined to evaluate regional wall motion. The left ventricular internal cavity size was normal in size. There is no left ventricular hypertrophy. Left ventricular diastolic function could not be evaluated. Right Ventricle: The right ventricular size is normal. No increase in right ventricular wall thickness. Right ventricular systolic function is normal. Tricuspid regurgitation signal is inadequate for assessing PA pressure. Left Atrium: Left atrial size was mildly dilated. Right Atrium: Right atrial size was normal in size. Pericardium: There is no evidence of pericardial effusion. Mitral Valve: The mitral valve is abnormal. Moderate mitral annular calcification. No evidence of mitral valve stenosis. Tricuspid Valve: The tricuspid valve is normal in structure. Tricuspid valve regurgitation is not demonstrated. No evidence of tricuspid stenosis. Aortic Valve: The aortic valve is normal in structure. Aortic valve  regurgitation is not visualized. Mild aortic stenosis is present. Pulmonic Valve: The pulmonic valve was normal in structure. Pulmonic valve regurgitation is not visualized. No evidence of pulmonic stenosis. Aorta: The aortic root is normal in size and structure. Venous: The inferior vena cava was not well visualized. IAS/Shunts: No atrial level shunt detected by color flow Doppler. Additional Comments: There is a moderate pleural effusion. LEFT VENTRICLE PLAX 2D LVIDd:         3.92 cm LVIDs:         2.74 cm LV PW:         1.54 cm LV IVS:        0.97 cm LVOT diam:     2.00 cm LVOT Area:     3.14 cm  LEFT ATRIUM         Index LA diam:    5.10 cm 2.78 cm/m                        PULMONIC VALVE AORTA                 PV Vmax:        1.12 m/s Ao Root diam: 2.40 cm PV Peak grad:   5.0 mmHg                       RVOT Peak grad: 12 mmHg   SHUNTS Systemic Diam: 2.00 cm Kathlyn Sacramento MD Electronically signed by Kathlyn Sacramento MD Signature Date/Time: 05/23/2020/10:19:48 AM    Final    CT CHEST ABDOMEN PELVIS WO CONTRAST  Result Date: 05/18/2020 CLINICAL DATA:  Chest pain or shortness of breath. Pleural effusion or pleurisy suspected EXAM: CT CHEST, ABDOMEN AND PELVIS WITHOUT CONTRAST TECHNIQUE: Multidetector CT imaging of the chest, abdomen and pelvis was performed following the standard protocol without IV contrast. COMPARISON:  01/13/2017 FINDINGS:  CT CHEST FINDINGS Cardiovascular: Normal heart size. No pericardial effusion. Bulky atherosclerotic calcification of the aorta and great vessels. Coronary atherosclerosis. Mediastinum/Nodes: No adenopathy or mass. Moderate sliding hiatal hernia. Lungs/Pleura: Dense consolidation throughout the left lung with poor visualization of central airways. Small left pleural effusion. Musculoskeletal: Nonacute right humeral neck fracture with marked anterior displacement that is similar to initial diagnosis February 2022 by radiography. Extensive spondylosis and thoracic spine  ankylosis. Multilevel degenerative facet spurring especially at the open T6-7 level where there is biforaminal impingement. CT ABDOMEN PELVIS FINDINGS Hepatobiliary: No focal liver abnormality.No evidence of biliary obstruction or stone. Pancreas: Unremarkable. Spleen: Unremarkable. Adrenals/Urinary Tract: Negative adrenals. No hydronephrosis or stone. Left upper pole renal lesion which is a cyst by ultrasound in 2020. Peripherally calcified structure in the left upper pole which was also described on prior. Unremarkable bladder. Stomach/Bowel: No obstruction. No visible bowel inflammation. Numerous distal colonic diverticula. Distal colonic stool with moderate distension of the rectum. No superimposed rectal wall thickening. Vascular/Lymphatic: No acute vascular abnormality. No mass or adenopathy. Reproductive:Hysterectomy Other: No ascites or pneumoperitoneum. Musculoskeletal: No acute abnormalities. Severe lumbar spine degeneration which is generalized. Generalized osteopenia. Hip osteoarthritis that is particularly advanced on the right. IMPRESSION: 1. Extensive left-sided pneumonia opacifying most of the left lung. Small left parapneumonic effusion. 2. No acute intra-abdominal finding. 3. Numerous chronic/known incidental findings which are described above. Electronically Signed   By: Monte Fantasia M.D.   On: 05/18/2020 11:21   US THORACENTESIS ASP PLEURAL SPACE W/IMG GUIDE  Result Date: 05/28/2020 INDICATION: Patient with history of multifocal pneumonia with left sided pleural effusion. Request is for therapeutic and diagnostic left-sided thoracentesis EXAM: ULTRASOUND GUIDED THERAPEUTIC AND DIAGNOSTIC THORACENTESIS MEDICATIONS: Lidocaine 1% 10 mL COMPLICATIONS: None immediate. PROCEDURE: An ultrasound guided thoracentesis was thoroughly discussed with the patient and questions answered. The benefits, risks, alternatives and complications were also discussed. The patient understands and wishes to proceed  with the procedure. Written consent was obtained. Ultrasound was performed to localize and mark an adequate pocket of fluid in the left chest. The area was then prepped and draped in the normal sterile fashion. 1% Lidocaine was used for local anesthesia. Under ultrasound guidance a 6 Fr Safe-T-Centesis catheter was introduced. Thoracentesis was performed. The catheter was removed and a dressing applied. FINDINGS: A total of approximately 600 mL of straw-colored fluid was removed. Samples were sent to the laboratory as requested by the clinical team. IMPRESSION: Successful ultrasound guided therapeutic and diagnostic left-sided thoracentesis yielding 600 mL of pleural fluid. Read by: Rushie Nyhan, NP Electronically Signed   By: Corrie Mckusick D.O.   On: 05/28/2020 11:33   US THORACENTESIS ASP PLEURAL SPACE W/IMG GUIDE  Result Date: 05/24/2020 INDICATION: Symptomatic left sided pleural effusion. Please perform ultrasound-guided thoracentesis for diagnostic and therapeutic purposes. EXAM: US THORACENTESIS ASP PLEURAL SPACE W/IMG GUIDE COMPARISON:  Chest radiograph-05/22/2020; 05/18/2020; chest CT-05/18/2020 MEDICATIONS: None. COMPLICATIONS: None immediate. TECHNIQUE: Informed written consent was obtained from the the patient and the patient's daughter after a discussion of the risks, benefits and alternatives to treatment. A timeout was performed prior to the initiation of the procedure. Initial ultrasound scanning demonstrates a small left-sided pleural effusion with dominant component loculated regional to the left upper lobe. The posterior aspect of the left upper/mid chest was prepped and draped in the usual sterile fashion. 1% lidocaine was used for local anesthesia. Under direct ultrasound guidance, an 8 Fr Safe-T-Centesis catheter was introduced. The thoracentesis was performed. The catheter was removed and a dressing was  applied. The patient tolerated the procedure well without immediate post  procedural complication. The patient was escorted to have an upright chest radiograph. FINDINGS: A total of approximately 250 cc of serous fluid was removed. Requested samples were sent to the laboratory. IMPRESSION: Successful ultrasound-guided left sided thoracentesis yielding 250 cc of pleural fluid. Electronically Signed   By: Sandi Mariscal M.D.   On: 05/24/2020 12:20      ASSESSMENT/PLAN   Acute on chronic hypoxemic respiratory failure S/p thoracentesis with negative gm stain , neutrophil predominant exudate due to diuresis . -patient significantly clinically improved, looks better then she did in office 4 months ago. -She has CKD and CHF with cardiology following - appreciate input -she has reactive airway disease with asthma and Obesity hypoventilation overlap and does benefit from BIPAP nocturnally. -She is with severe deconditioning which is precluding her from taking vigorous breath and she should have IS and Chest PT -reviewed CT chest no pneumonia no mass no pneumonthorax no fibrosis, +atelectasis LLL and effusion left side -Reviewed care plan with daughter and patient at bedside -Met with Attending physician and reviewed imaging and plan together.  -repeat thoracentesis - patient feels better - 4/5 - reviewed cxr - improved  -4/6 - repeat thoracentesis today       Thank you for allowing me to participate in the care of this patient.    Patient/Family are satisfied with care plan and all questions have been answered.  This document was prepared using Dragon voice recognition software and may include unintentional dictation errors.     Ottie Glazier, M.D.  Division of [facility]

## 2020-05-30 NOTE — TOC Progression Note (Signed)
Transition of Care [facility]) - Progression Note    Patient Details  Name: Amy [name] MRN: [id number] Date of Birth: 01/18/1928  Transition of Care [facility]) CM/SW Contact  Eileen Stanford, LCSW Phone Number: 05/30/2020, 12:34 PM  Clinical Narrative:   Pt and pt's daughter agreeable for [facility] however concerned about coverage since pt has used up her medicare days. CSW to call facility to determine,         Expected Discharge Plan and Services                                                 Social Determinants of Health (SDOH) Interventions    Readmission Risk Interventions Readmission Risk Prevention Plan 08/23/2019 08/15/2019  Transportation Screening Complete Complete  Medication Review [employer]) Complete Complete  PCP or Specialist appointment within 3-5 days of discharge Complete Complete  HRI or Home Care Consult Complete Complete  SW Recovery Care/Counseling Consult Complete Complete  Palliative Care Screening Not Applicable Not [place] Complete Not Applicable  Some recent data might be hidden

## 2020-05-30 NOTE — Progress Notes (Signed)
PROGRESS NOTE    Amy [name]  TML:[id number] DOB: Feb 03, 1928 DOA: 05/18/2020 PCP: Alvester Morin, MD   Brief Narrative:  Patient was admitted on 05/18/2020 with shortness of breath and abdominal pain.  Past medical history of hypertension, hyperlipidemia, type 2 diabetes, COPD, CAD, hypothyroidism, atrial fibrillation, seizure, chronic kidney disease, anemia, CHF.  She was found to have clinical sepsis with multifocal pneumonia and left pleural effusion.  The patient has received 10 days of antibiotics so antibiotics were stopped.  She had a thoracentesis on 05/24/2020 removing 250 mL of fluid.  Another thoracentesis on 05/28/2020 removing 600 mL of fluid.  Nothing growing on a either culture.  Follow-up cytology of the second thoracentesis.  Repeat chest x-ray little bit better than previous x-rays.  Pulmonary does not want to do another thoracentesis at this point.  Patient was also found to have NSTEMI.  Patient also had acute metabolic encephalopathy and was barely responsive 5 days ago.  Patient also has bilateral feet ankle and leg pain and her Lyrica was restarted and slowly improving.  I did put on low-dose prednisone also.   Assessment & Plan:   Principal Problem:   Multifocal pneumonia Active Problems:   Acquired hypothyroidism   HLD (hyperlipidemia)   Essential hypertension   Iron deficiency anemia   Chronic diastolic CHF (congestive heart failure) (HCC)   Anemia of chronic disease   COPD with chronic bronchitis (HCC)   Severe sepsis (HCC)   CKD (chronic kidney disease) stage 4, GFR 15-29 ml/min (HCC)   HTN (hypertension)   GERD (gastroesophageal reflux disease)   CAD (coronary artery disease)   NSTEMI (non-ST elevated myocardial infarction) (HCC)   AF (paroxysmal atrial fibrillation) (HCC)   Seizure (HCC)   Elevated troponin   Abdominal pain   Diarrhea   Acute respiratory failure with hypoxia (HCC)   Recurrent left pleural effusion   Acute metabolic  encephalopathy   Pain  Multifocal pneumonia Recurrent pleural effusion Severe sepsis secondary to above Patient with multiple thoracentesis during admission Thora 3/31, 250 cc fluid Thora 4/4, 600 cc fluid Follow-up chest x-ray shows improved air entry but persistent effusion Plan: Case discussed with pulmonary, repeat thoracentesis ordered Continue low-dose torsemide Patient is completed 10-day course of antibiotics, no indication to restart at this time  Bilateral pain in feet and ankles Suspect neuropathic etiology Improved with Lyrica Plan: Continue Lyrica Continue low-dose prednisone, will taper  Acute metabolic encephalopathy Suspect multifactorial etiology in the setting of sepsis and infection Mental status appears to be at baseline CT scan head negative x2 Plan: Delirium precautions Frequent neuro checks  NSTEMI Medical management per cardiology Was on heparin drip 2 days beginning of hospital course Troponin peak 1196 Plan: Telemetry monitoring Continue Plavix continue statin  Acute hypoxemic respiratory failure No home oxygen requirement Low pulse ox 85% room air Currently on 2 L Plan: Pulmonary following [facility] per pulmonary recommendations Repeat thoracentesis ordered  Acute kidney injury on chronic kidney disease stage IIIb Peak creatinine 1.84 Now downtrending Plan: Avoid hypotension Avoid nonessential nephrotoxins Continue low-dose torsemide Monitor daily kidney function  Essential hypertension Reasonable control over interval Continue low-dose torsemide Continue Norvasc  PAF Rate controlled No anticoagulation due to history of GI bleed  Seizure disorder Continue Keppra  Iron deficiency anemia P.o. iron supplementation  Weakness Functional decline Therapy evaluations Skilled nursing facility at time of discharge     DVT prophylaxis: Lovenox Code Status: DNR Family Communication: None today  disposition Plan: Status is:  Inpatient  Remains  inpatient appropriate because:Inpatient level of care appropriate due to severity of illness   Dispo: The patient is from: Home              Anticipated d/c is to: SNF              Patient currently is not medically stable to d/c.   Difficult to place patient No  Approaching medical readiness.  Plan for repeat thoracentesis per pulmonary recommendations.  Should be stable for discharge to skilled nursing facility if respiratory status improves after repeat Thora     Level of care: Progressive Cardiac  Consultants:   Cardiology  Pulmonology  Procedures:   Thoracentesis x2  Antimicrobials:   None   Subjective: Patient seen and examined.  Sitting up in bed.  Still short of breath.  Answers questions appropriately.  Objective: Vitals:   05/29/20 1912 05/30/20 0354 05/30/20 0934 05/30/20 1201  BP:  (!) 195/72 (!) 185/96 133/77  Pulse:  66 68 (!) 59  Resp:  19  18  Temp: 98.2 F (36.8 C) 98.2 F (36.8 C)    TempSrc:      SpO2:  98% 100% 100%  Weight:      Height:        Intake/Output Summary (Last 24 hours) at 05/30/2020 1336 Last data filed at 05/30/2020 1240 Gross per 24 hour  Intake 360 ml  Output 1250 ml  Net -890 ml   Filed Weights   05/26/20 0625 05/27/20 0500 05/28/20 0424  Weight: 82.8 kg 82.9 kg 81.3 kg    Examination:  General exam: No acute distress.  Appears frail Respiratory system: Bibasilar crackles, greater on the right, normal work of breathing, 2 L Cardiovascular system: S1-S2, regular rate and rhythm, no murmur Gastrointestinal system: Abdomen is nondistended, soft and nontender. No organomegaly or masses felt. Normal bowel sounds heard. Central nervous system: Alert, oriented x2, no focal deficits Extremities: Symmetric 5 x 5 power. Skin: No rashes, lesions or ulcers Psychiatry: Judgement and insight appear normal. Mood & affect appropriate.     Data Reviewed: I have personally reviewed following labs and imaging  studies  CBC: Recent Labs  Lab 05/25/20 0600 05/26/20 0432 05/27/20 0543 05/28/20 0646 05/30/20 0839  WBC 13.3* 13.9* 12.3* 11.0* 12.1*  NEUTROABS  --   --   --   --  8.4*  HGB 8.8* 9.0* 9.6* 9.9* 13.2  HCT 27.9* 28.8* 30.3* 31.6* 43.1  MCV 94.9 95.0 93.8 94.3 95.8  PLT 190 219 235 263 371   Basic Metabolic Panel: Recent Labs  Lab 05/24/20 0555 05/25/20 0731 05/26/20 0432 05/27/20 0543 05/28/20 0646 05/29/20 1010 05/30/20 0742  NA 143 141 143 144 145 142 142  K 4.4 4.5 4.2 4.2 4.3 5.1 4.9  CL 111 112* 113* 113* 114* 113* 113*  CO2 22 22 26 24 26  20* 24  GLUCOSE 97 112* 126* 138* 119* 118* 107*  BUN 43* 42* 41* 35* 37* 41* 42*  CREATININE 1.84* 1.74* 1.49* 1.39* 1.30* 1.29* 1.20*  CALCIUM 8.4* 8.3* 8.2* 8.2* 8.2* 8.3* 8.1*  MG 1.8 1.9 1.8 1.8 1.9  --   --    GFR: Estimated Creatinine Clearance: 30.2 mL/min (A) (by C-G formula based on SCr of 1.2 mg/dL (H)). Liver Function Tests: No results for input(s): AST, ALT, ALKPHOS, BILITOT, PROT, ALBUMIN in the last 168 hours. No results for input(s): LIPASE, AMYLASE in the last 168 hours. No results for input(s): AMMONIA in the last 168 hours. Coagulation Profile:  No results for input(s): INR, PROTIME in the last 168 hours. Cardiac Enzymes: No results for input(s): CKTOTAL, CKMB, CKMBINDEX, TROPONINI in the last 168 hours. BNP (last 3 results) No results for input(s): PROBNP in the last 8760 hours. HbA1C: No results for input(s): HGBA1C in the last 72 hours. CBG: No results for input(s): GLUCAP in the last 168 hours. Lipid Profile: No results for input(s): CHOL, HDL, LDLCALC, TRIG, CHOLHDL, LDLDIRECT in the last 72 hours. Thyroid Function Tests: No results for input(s): TSH, T4TOTAL, FREET4, T3FREE, THYROIDAB in the last 72 hours. Anemia Panel: No results for input(s): VITAMINB12, FOLATE, FERRITIN, TIBC, IRON, RETICCTPCT in the last 72 hours. Sepsis Labs: No results for input(s): PROCALCITON, LATICACIDVEN in the last  168 hours.  Recent Results (from the past 240 hour(s))  MRSA PCR Screening     Status: None   Collection Time: 05/23/20  8:16 AM   Specimen: Nasal Mucosa; Nasopharyngeal  Result Value Ref Range Status   MRSA by PCR NEGATIVE NEGATIVE Final    Comment:        The GeneXpert MRSA Assay (FDA approved for NASAL specimens only), is one component of a comprehensive MRSA colonization surveillance program. It is not intended to diagnose MRSA infection nor to guide or monitor treatment for MRSA infections. Performed at [facility], [address] [place], [place] [address]   Body fluid culture w Gram Stain     Status: None   Collection Time: 05/24/20 11:58 AM   Specimen: PATH Cytology Pleural fluid  Result Value Ref Range Status   Specimen Description   Final    PLEURAL Performed at [facility], [address] [place], [place] [address]    Special Requests   Final    NONE Performed at [facility], [address] [place], [place] [address]    Gram Stain   Final    FEW WBC PRESENT, PREDOMINANTLY MONONUCLEAR NO ORGANISMS SEEN    Culture   Final    NO GROWTH 3 DAYS Performed at [facility], [place] [address], [place], [place] [address]    Report Status 05/27/2020 FINAL  Final  Body fluid culture w Gram Stain     Status: None (Preliminary result)   Collection Time: 05/28/20 11:00 AM   Specimen: PATH Cytology Pleural fluid  Result Value Ref Range Status   Specimen Description   Final    PLEURAL Performed at [facility], [address], [place], [place] [address]    Special Requests   Final    NONE Performed at [facility], [address] [place], [place] [address]    Gram Stain   Final    RARE WBC PRESENT, PREDOMINANTLY MONONUCLEAR NO ORGANISMS SEEN    Culture   Final    NO GROWTH 2 DAYS Performed at [facility], [place] [address] [place], [place] [address]    Report Status PENDING  Incomplete          Radiology Studies: DG Chest Port 1 View  Result Date: 05/29/2020 CLINICAL DATA:  87 year old female with cough and progressive shortness of breath. Hypoxia on room air. Recent left side thoracentesis. EXAM: PORTABLE CHEST 1 VIEW COMPARISON:  Portable chest 05/28/2020 and earlier. FINDINGS: Portable AP semi upright view at 1021 hours. Improved lung volumes from yesterday. The patient is less rotated now. Stable cardiac size and mediastinal contours. Left lung ventilation remains improved compared to the CT on 05/27/2020, with residual lower lung opacity which  is probably a combination of residual effusion and airspace disease. Right lung appears stable. No pneumothorax. Visualized tracheal air column is within normal limits. Stable visualized osseous structures. Un healed proximal right humerus fracture. Paucity of bowel gas in the upper abdomen. IMPRESSION: 1. Left lung ventilation remains improved since the CT on 05/27/2020 with residual lower lung combined pleural effusion and airspace disease suspected. 2. No new cardiopulmonary abnormality. Electronically Signed   By: Genevie Ann M.D.   On: 05/29/2020 10:55        Scheduled Meds: . amLODipine  2.5 mg Oral Daily  . atorvastatin  20 mg Oral Daily  . bisacodyl  10 mg Rectal QHS  . Chlorhexidine Gluconate Cloth  6 each Topical Daily  . clopidogrel  75 mg Oral Daily  . dextromethorphan-guaiFENesin  1 tablet Oral BID  . enoxaparin (LOVENOX) injection  30 mg Subcutaneous Q24H  . ipratropium-albuterol  3 mL Nebulization BID  . iron polysaccharides  150 mg Oral Daily  . levETIRAcetam  250 mg Oral BID  . levothyroxine  100 mcg Oral Q0600  . lidocaine  1 patch Transdermal Q24H  . magnesium oxide  400 mg Oral Daily  . mometasone-formoterol  2 puff Inhalation BID  . montelukast  10 mg Oral QHS  . pantoprazole  20 mg Oral Daily  . predniSONE  10 mg Oral Q breakfast  . pregabalin  50 mg Oral q AM   And  . pregabalin  100 mg Oral QHS  .  sodium chloride flush  10-40 mL Intracatheter Q12H  . sodium phosphate  1 enema Rectal Daily  . torsemide  10 mg Oral Daily   Continuous Infusions:   LOS: 12 days    Time spent: 25 minutes    Sidney Ace, MD [facility] Pager [phone]  If 7PM-7AM, please contact night-coverage  05/30/2020, 1:36 PM

## 2020-05-31 ENCOUNTER — Inpatient Hospital Stay: Payer: Medicare Other

## 2020-05-31 DIAGNOSIS — J189 Pneumonia, unspecified organism: Secondary | ICD-10-CM | POA: Diagnosis not present

## 2020-05-31 LAB — BASIC METABOLIC PANEL
Anion gap: 8 (ref 5–15)
BUN: 42 mg/dL — ABNORMAL HIGH (ref 8–23)
CO2: 24 mmol/L (ref 22–32)
Calcium: 7.9 mg/dL — ABNORMAL LOW (ref 8.9–10.3)
Chloride: 110 mmol/L (ref 98–111)
Creatinine, Ser: 1.21 mg/dL — ABNORMAL HIGH (ref 0.44–1.00)
GFR, Estimated: 42 mL/min — ABNORMAL LOW (ref >60)
Glucose, Bld: 148 mg/dL — ABNORMAL HIGH (ref 70–99)
Potassium: 4.6 mmol/L (ref 3.5–5.1)
Sodium: 142 mmol/L (ref 135–145)

## 2020-05-31 MED ORDER — PREDNISONE 10 MG PO TABS: 5.0000 mg | ORAL_TABLET | Freq: Every day | ORAL | Status: DC

## 2020-05-31 MED ORDER — DOXAZOSIN MESYLATE 2 MG PO TABS
2.0000 mg | ORAL_TABLET | Freq: Two times a day (BID) | ORAL | Status: DC
  Administered 2020-05-31 – 2020-06-02 (×6): 2 mg via ORAL
  Filled 2020-05-31 (×7): qty 1

## 2020-05-31 NOTE — Progress Notes (Signed)
Physical Therapy Treatment Patient Details Name: Amy [name] MRN: [id number] DOB: 11-28-1927 Today's Date: 05/31/2020    History of Present Illness Pt is a 87 yo female that presented to hspital due to abdominal pain, SOB, fever. Workup showed PNA, sepsis, elevated troponin (per cardiology demand ischemia); pt placed on heparin drip this admission. PMH: CAD, Cancer, dCHF c LVEF 60-65%, hypoTSH, stg-IV CKD, anemia of chronic disease, asthma, GERD, bilat knee OA, covid in January of 2022, has tested positive in Feb and March, R humeral head fx with sling.    PT Comments    Pt was sitting in recliner upon arriving with supportive daughter at bedside. She agrees to PT session and is cooperative throughout. Does present with flat affect but consistently follows commands throughout. Pt performed STS ~ 6 x throughout session. 3 x from recliner and 3 x from [facility] surface. Pt did successfully urinate 450 CC in [place] prior to returning to recliner. Overall pt tolerated session well. She has poor standing posture and endurance. Will greatly benefit from SNF at [place] to address deficits while maximizing independence with ADLs. At conclusion of session, pt was in recliner, with call bell in reach and daughter at bedside.   Follow Up Recommendations  SNF     Equipment Recommendations   (defer to next level of care)       Precautions / Restrictions Precautions Precautions: Fall;Shoulder Shoulder Interventions: Shoulder sling/immobilizer Precaution Booklet Issued: No Restrictions Weight Bearing Restrictions: Yes RUE Weight Bearing: Non weight bearing    Mobility  Bed Mobility    General bed mobility comments: in recliner pre/post session    Transfers Overall transfer level: Needs assistance Equipment used: Rolling walker (2 wheeled) (Only used LUE on RW. Did not have hemi-walker in room) Transfers: Sit to/from Stand Sit to Stand: Mod assist;Min assist (pt performed STS 3 x from [facility] and 3 x from  recliner. performed stand pivot one time to/from [facility]) Stand pivot transfers: Mod assist;Max assist       General transfer comment: Session focused on transfers and standing tolerance. performed STS 6x throughout session. was able to take pivoting steps to transfer to/from [facility]. urinated 450 CC prior to returning to recliner. Pt does fatigue quickly in standing. Overall poor standing posture  Ambulation/Gait Ambulation/Gait assistance: Mod assist Gait Distance (Feet): 3 Feet Assistive device: 1 person hand held assist Gait Pattern/deviations: Step-to pattern Gait velocity: decreased   General Gait Details: pt mostly slides/shuffles feet during transfer to/from [facility]       Balance Overall balance assessment: Needs assistance Sitting-balance support: Feet supported Sitting balance-Leahy Scale: Good Sitting balance - Comments: steady sitting reaching within BOS with L UE   Standing balance support: Single extremity supported Standing balance-Leahy Scale: Poor Standing balance comment: poor standing endurance and posture. High fall risk         Cognition Arousal/Alertness: Awake/alert Behavior During Therapy: WFL for tasks assessed/performed Overall Cognitive Status: Within Functional Limits for tasks assessed      General Comments: Pt is A and O but needs increased time for processing             Pertinent Vitals/Pain Pain Assessment: 0-10 Pain Score: 6  Faces Pain Scale: Hurts little more Pain Location: R shoulder Pain Descriptors / Indicators: Aching;Sore Pain Intervention(s): Limited activity within patient's tolerance;Monitored during session;Premedicated before session;Patient requesting pain meds-RN notified           PT Goals (current goals can now be found in the care plan section)  Acute Rehab PT Goals Patient Stated Goal: none stated Progress towards PT goals: Progressing toward goals    Frequency    Min 2X/week      PT Plan Current plan remains  appropriate    Co-evaluation     PT goals addressed during session: Mobility/safety with mobility;Balance;Proper use of DME;Strengthening/ROM        AM-PAC PT "6 Clicks" Mobility   Outcome Measure  Help needed turning from your back to your side while in a flat bed without using bedrails?: A Lot Help needed moving from lying on your back to sitting on the side of a flat bed without using bedrails?: A Lot Help needed moving to and from a bed to a chair (including a wheelchair)?: A Lot Help needed standing up from a chair using your arms (e.g., wheelchair or bedside chair)?: A Lot Help needed to walk in hospital room?: Total Help needed climbing 3-5 steps with a railing? : Total 6 Click Score: 10    End of Session Equipment Utilized During Treatment: Gait belt (RUE sling) Activity Tolerance: Patient limited by fatigue Patient left: in chair;with call bell/phone within reach;with chair alarm set;with nursing/sitter in room;with family/visitor present;Other (comment) Nurse Communication: Mobility status PT Visit Diagnosis: Muscle weakness (generalized) (M62.81);Other abnormalities of gait and mobility (R26.89)     Time: 0630-1601 PT Time Calculation (min) (ACUTE ONLY): 29 min  Charges:  $Therapeutic Activity: 23-37 mins                     Julaine Fusi PTA 05/31/20, 11:30 AM

## 2020-05-31 NOTE — TOC Progression Note (Signed)
Transition of Care [facility]) - Progression Note    Patient Details  Name: Amy [name] MRN: [id number] Date of Birth: 03/26/1927  Transition of Care [facility]) CM/SW [place], RN Phone Number: 05/31/2020, 2:38 PM  Clinical Narrative: Spoke with patient and daughter, [name] at bedside. Patient alert and oriented x3. Discussed with daughter that patient has used the allotted days for SNF, therefore private pay will be needed. Daughter asked about secondary Insurance? Daughter told that secondary Insurance did not cover for SNF services, she said she would call them to inquire. Daughter wanted to know the private pay fee, I told her I did not know however she can call and I will consult with my team and get back with her. Daughter states she will go to website and call around.  I did consult with the team and have more information about cost, attempted to call patient, no answer left voice message to return call.          Expected Discharge Plan and Services                                                 Social Determinants of Health (SDOH) Interventions    Readmission Risk Interventions Readmission Risk Prevention Plan 08/23/2019 08/15/2019  Transportation Screening Complete Complete  Medication Review Press photographer) Complete Complete  PCP or Specialist appointment within 3-5 days of discharge Complete Complete  HRI or Home Care Consult Complete Complete  SW Recovery Care/Counseling Consult Complete Complete  Palliative Care Screening Not Applicable Not [place] Complete Not Applicable  Some recent data might be hidden

## 2020-05-31 NOTE — Plan of Care (Signed)
  Problem: Education: Goal: Knowledge of General Education information will improve Description: Including pain rating scale, medication(s)/side effects and non-pharmacologic comfort measures 05/31/2020 1351 by Cristela Blue, RN Outcome: Progressing 05/31/2020 1351 by Cristela Blue, RN Outcome: Progressing   Problem: Health Behavior/Discharge Planning: Goal: Ability to manage health-related needs will improve 05/31/2020 1351 by Cristela Blue, RN Outcome: Progressing 05/31/2020 1351 by Cristela Blue, RN Outcome: Progressing   Problem: Clinical Measurements: Goal: Ability to maintain clinical measurements within normal limits will improve 05/31/2020 1351 by Cristela Blue, RN Outcome: Progressing 05/31/2020 1351 by Cristela Blue, RN Outcome: Progressing Goal: Will remain free from infection 05/31/2020 1351 by Cristela Blue, RN Outcome: Progressing 05/31/2020 1351 by Cristela Blue, RN Outcome: Progressing Goal: Diagnostic test results will improve 05/31/2020 1351 by Cristela Blue, RN Outcome: Progressing 05/31/2020 1351 by Cristela Blue, RN Outcome: Progressing Goal: Respiratory complications will improve 05/31/2020 1351 by Cristela Blue, RN Outcome: Progressing 05/31/2020 1351 by Cristela Blue, RN Outcome: Progressing Goal: Cardiovascular complication will be avoided 05/31/2020 1351 by Cristela Blue, RN Outcome: Progressing 05/31/2020 1351 by Cristela Blue, RN Outcome: Progressing   Problem: Activity: Goal: Risk for activity intolerance will decrease 05/31/2020 1351 by Cristela Blue, RN Outcome: Progressing 05/31/2020 1351 by Cristela Blue, RN Outcome: Progressing   Problem: Nutrition: Goal: Adequate nutrition will be maintained 05/31/2020 1351 by Cristela Blue, RN Outcome: Progressing 05/31/2020 1351 by Cristela Blue, RN Outcome: Progressing   Problem: Coping: Goal: Level of anxiety will decrease 05/31/2020 1351 by Cristela Blue, RN Outcome: Progressing 05/31/2020 1351 by  Cristela Blue, RN Outcome: Progressing   Problem: Elimination: Goal: Will not experience complications related to bowel motility 05/31/2020 1351 by Cristela Blue, RN Outcome: Progressing 05/31/2020 1351 by Cristela Blue, RN Outcome: Progressing Goal: Will not experience complications related to urinary retention 05/31/2020 1351 by Cristela Blue, RN Outcome: Progressing 05/31/2020 1351 by Cristela Blue, RN Outcome: Progressing   Problem: Pain Managment: Goal: General experience of comfort will improve 05/31/2020 1351 by Cristela Blue, RN Outcome: Progressing 05/31/2020 1351 by Cristela Blue, RN Outcome: Progressing   Problem: Safety: Goal: Ability to remain free from injury will improve 05/31/2020 1351 by Cristela Blue, RN Outcome: Progressing 05/31/2020 1351 by Cristela Blue, RN Outcome: Progressing   Problem: Skin Integrity: Goal: Risk for impaired skin integrity will decrease 05/31/2020 1351 by Cristela Blue, RN Outcome: Progressing 05/31/2020 1351 by Cristela Blue, RN Outcome: Progressing

## 2020-05-31 NOTE — Care Management Important Message (Signed)
Important Message  Patient Details  Name: Amy [name] MRN: [id number] Date of Birth: 07/30/1927   Medicare Important Message Given:  Yes     Dannette Barbara 05/31/2020, 12:57 PM

## 2020-05-31 NOTE — Progress Notes (Signed)
Pulmonary Medicine          Date: 05/31/2020,   MRN# [id number] Amy [name] [birth year]     AdmissionWeight: 86.2 kg                 CurrentWeight: 81.3 kg   Referring physician: Dr Earleen Newport    CHIEF COMPLAINT:   Multifocal Pneumonia with parapneumonic effusion.   HISTORY OF PRESENT ILLNESS   As per admission h/p Patient states that she started having abdominal pain which was located in the left side of the abdomen, associated with nausea and diarrhea.  She has had 3-4 times of loose stool bowel movement.  She also had fever of 101 in facility, currently body temperature is 97.5 in ED. Patient has cough and shortness of breath which has been progressively worsening.  She was found to have oxygen desaturation to 85% on room air, which improved to 94% on 5 L oxygen.  Denies chest pain.  No rectal bleeding or dark stool.  Pt was found to have WBC 4.7, lactic acid 2.9, 1.5, INR 1.1, PTT 31, troponin level 638, 933, positive Covid PCR, renal function close to baseline, temperature 97.5, softer blood pressure 93/39, heart rate 98, RR 28.  Chest x-ray showed multifocal infiltration mainly in the left side, vascular congestion and cardiomegaly.  CT of the chest/abdomen/pelvis showed left lower lobe infiltration,  no acute issues intra-abdominally. After 9 day pneumonia tx and thoracentesis patient still has bilateral infiltrates and PCCM consultation placed for additional evaluation and management.   05/28/20- Patient is s/p thoracentesis, she feels better already. She is working with [employer] therapy and feels that after first session she is already breathing better.  She had OT today. We discussed using incentive spiroemetry.  I discussed her care plan with Dr Leslye Peer today.   05/29/20- repeat CXR this am with residual effusion on left and bibasilar atelectasis.  We discussed possibly needing repeat thoracentesis.  She is on room air , daughter in room. I reviwed plan with  family , Respiratory therapist Demetria and Attending physician Dr Leslye Peer. We will focus on continuing medical mgt, placement, PT/OT and outpatient follow up.   05/30/20- patient examined at bedside. She continues to be with SOB and hypoxemia on 2L/min which is new for her. She does still have fluid around left lung and felt most improved post thoracentesis. She used Metaneb and waited another 24h with diuresis but has not been able to improve further. We agreed on repeat thoracentesis today, patient may sign her own consent  05/31/20- patient is improved, she had repeat thoracentesis ordered with minimal effusions noted on [place] and no tap performed.  She is off nasal canula oxygen now on room air.  She is still with decreased air entry on left which may just be from atelectasis and if that's the case we can initiate DC planning to SNF per PT recommendation.  Will obtain non conrasted CT chest today to further evaluate.    PAST MEDICAL HISTORY   Past Medical History:  Diagnosis Date  . Bladder incontinence   . CAD (coronary artery disease)    a. 05/2009 Cath: LAD 90p (Xience 2.75 x 12 mm DES), 90m D1 40, LCx 40p/m, RCA 30/40/30p/m, RCA 30/25d;  b.  12/2011 Lexiscan MV: no ischemia, breast attenuation artifact, normal EF-->Low risk; c. 10/2016 MV: fixed apical defect, most likely apical thinning and attenuation, No ischemia, EF 60%.  [place]  .  Carotid arterial disease w/ R Carotid Bruit (HCC)    a. 08/2016 Carotid U/S: 40-59% bilat ICA stenosis - f/u 1 yr.  . Chronic diastolic (congestive) heart failure ([place])    a. Echo 09/2015: EF 55-60% w/ Grade 1 DD, sev Ca2+ MV annulus, mildly dil LA; b. 09/2016 Echo: EF 65-70%, Gr2 DD, mildly dil LA/RA, nl RV fxn.  . Chronic Dyspnea on exertion   . CKD (chronic kidney disease) stage 3, GFR 30-59 ml/min (HCC)   . Degenerative arthritis of knee    bilateral knees  . Diabetes mellitus    Type II  . GIB (gastrointestinal bleeding)    a. 08/2016  Admission w/ presyncope/anemia/melena-->req Transfusion-->endo ok, colonoscopy w/ polyps but no source of bleeding (most likely diverticular).  . Hiatal hernia   . Hypertension   . Iron deficiency   . Menopausal symptoms   . Morbid obesity ([place])   . Renal insufficiency   . Thyroid disease    hypothyroidism     SURGICAL HISTORY   Past Surgical History:  Procedure Laterality Date  . COLONOSCOPY  2015  . COLONOSCOPY WITH PROPOFOL N/A 09/25/2016   Procedure: COLONOSCOPY WITH PROPOFOL;  Surgeon: [name], MD;  Location: [facility] ENDOSCOPY;  Service: Gastroenterology;  Laterality: N/A;  . COLONOSCOPY WITH PROPOFOL N/A 09/26/2016   Procedure: COLONOSCOPY WITH PROPOFOL;  Surgeon: [name], MD;  Location: [facility] ENDOSCOPY;  Service: Gastroenterology;  Laterality: N/A;  . ESOPHAGOGASTRODUODENOSCOPY (EGD) WITH PROPOFOL N/A 09/25/2016   Procedure: ESOPHAGOGASTRODUODENOSCOPY (EGD) WITH PROPOFOL;  Surgeon: [name], MD;  Location: [facility] ENDOSCOPY;  Service: Gastroenterology;  Laterality: N/A;  . RENAL ANGIOGRAPHY N/A 02/07/2019   Procedure: RENAL ANGIOGRAPHY;  Surgeon: [name], MD;  Location: [place] CV LAB;  Service: Cardiovascular;  Laterality: N/A;  . REPLACEMENT TOTAL KNEE BILATERAL    . TOTAL VAGINAL HYSTERECTOMY     ovarian mass, not cancerous  . UPPER GI ENDOSCOPY  2015     FAMILY HISTORY   Family History  Problem Relation Age of Onset  . Breast cancer Mother 47     SOCIAL HISTORY   Social History   Tobacco Use  . Smoking status: Never Smoker  . Smokeless tobacco: Never Used  Vaping Use  . Vaping Use: Never used  Substance Use Topics  . Alcohol use: No  . Drug use: No     MEDICATIONS    Home Medication:    Current Medication:  Current Facility-Administered Medications:  .  acetaminophen (TYLENOL) tablet 650 mg, 650 mg, Oral, Q6H PRN, Oswald Hillock, RPH, 650 mg at 05/31/20 1009 .  albuterol (PROVENTIL) (2.5 MG/3ML) 0.083% nebulizer solution 2.5 mg, 2.5  mg, Nebulization, Q4H PRN, Ivor Costa, MD .  amLODipine (NORVASC) tablet 2.5 mg, 2.5 mg, Oral, Daily, Leslye Peer, Richard, MD, 2.5 mg at 05/31/20 0906 .  atorvastatin (LIPITOR) tablet 20 mg, 20 mg, Oral, Daily, Oswald Hillock, RPH, 20 mg at 05/30/20 2113 .  bisacodyl (DULCOLAX) suppository 10 mg, 10 mg, Rectal, QHS, Wieting, Richard, MD, 10 mg at 05/25/20 2050 .  Chlorhexidine Gluconate Cloth 2 % PADS 6 each, 6 each, Topical, Daily, Loletha Grayer, MD, 6 each at 05/30/20 [phone] .  clopidogrel (PLAVIX) tablet 75 mg, 75 mg, Oral, Daily, Ivor Costa, MD, 75 mg at 05/31/20 0904 .  dextromethorphan-guaiFENesin (MUCINEX DM) 30-600 MG per 12 hr tablet 1 tablet, 1 tablet, Oral, BID, Wyvonnia Dusky, MD, 1 tablet at 05/31/20 0905 .  docusate sodium (COLACE) capsule 100 mg, 100 mg, Oral, BID  PRN, Ivor Costa, MD .  doxazosin (CARDURA) tablet 2 mg, 2 mg, Oral, Q12H, Sreenath, Sudheer B, MD .  enoxaparin (LOVENOX) injection 30 mg, 30 mg, Subcutaneous, Q24H, Wieting, Richard, MD, 30 mg at 05/30/20 2114 .  ipratropium-albuterol (DUONEB) 0.5-2.5 (3) MG/3ML nebulizer solution 3 mL, 3 mL, Nebulization, BID, Wyvonnia Dusky, MD, 3 mL at 05/31/20 0825 .  iron polysaccharides (NIFEREX) capsule 150 mg, 150 mg, Oral, Daily, Ivor Costa, MD, 150 mg at 05/31/20 0906 .  levETIRAcetam (KEPPRA) tablet 250 mg, 250 mg, Oral, BID, Oswald Hillock, RPH, 250 mg at 05/31/20 0867 .  levothyroxine (SYNTHROID) tablet 100 mcg, 100 mcg, Oral, Q0600, Wyvonnia Dusky, MD, 100 mcg at 05/31/20 0542 .  lidocaine (LIDODERM) 5 % 1 patch, 1 patch, Transdermal, Q24H, Sreenath, Sudheer B, MD, 1 patch at 05/30/20 1238 .  magnesium oxide (MAG-OX) tablet 400 mg, 400 mg, Oral, Daily, Leslye Peer, Richard, MD, 400 mg at 05/31/20 0905 .  melatonin tablet 5 mg, 5 mg, Oral, QHS PRN, Ivor Costa, MD, 5 mg at 05/20/20 2108 .  mometasone-formoterol (DULERA) 200-5 MCG/ACT inhaler 2 puff, 2 puff, Inhalation, BID, Ivor Costa, MD, 2 puff at 05/31/20 0906 .   montelukast (SINGULAIR) tablet 10 mg, 10 mg, Oral, QHS, Ivor Costa, MD, 10 mg at 05/30/20 2113 .  nitroGLYCERIN (NITROSTAT) SL tablet 0.4 mg, 0.4 mg, Sublingual, Q5 Min x 3 PRN, Ivor Costa, MD .  ondansetron [facility]) injection 4 mg, 4 mg, Intravenous, Q8H PRN, Ivor Costa, MD .  pantoprazole (PROTONIX) EC tablet 20 mg, 20 mg, Oral, Daily, Oswald Hillock, RPH, 20 mg at 05/31/20 0904 .  polyethylene glycol (MIRALAX / GLYCOLAX) packet 17 g, 17 g, Oral, Daily PRN, Ivor Costa, MD .  predniSONE (DELTASONE) tablet 10 mg, 10 mg, Oral, Q breakfast, Leslye Peer, Richard, MD, 10 mg at 05/31/20 0904 .  pregabalin (LYRICA) capsule 50 mg, 50 mg, Oral, q AM, 50 mg at 05/31/20 0921 **AND** pregabalin (LYRICA) capsule 100 mg, 100 mg, Oral, QHS, Sreenath, Sudheer B, MD, 100 mg at 05/30/20 2114 .  sodium chloride flush (NS) 0.9 % injection 10-40 mL, 10-40 mL, Intracatheter, Q12H, Gollan, Kathlene November, MD, 10 mL at 05/31/20 0907 .  sodium chloride flush (NS) 0.9 % injection 10-40 mL, 10-40 mL, Intracatheter, PRN, Rockey Situ, Kathlene November, MD .  sodium phosphate (FLEET) 7-19 GM/118ML enema 1 enema, 1 enema, Rectal, Daily, Loletha Grayer, MD, 1 enema at 05/25/20 0901 .  torsemide (DEMADEX) tablet 10 mg, 10 mg, Oral, Daily, Leslye Peer, Richard, MD, 10 mg at 05/31/20 0905 .  traMADol (ULTRAM) tablet 50 mg, 50 mg, Oral, Q12H PRN, Wyvonnia Dusky, MD, 50 mg at 05/31/20 6195  Facility-Administered Medications Ordered in Other Encounters:  .  0.9 %  sodium chloride infusion, , Intravenous, Continuous, Finnegan, Kathlene November, MD    ALLERGIES   Atenolol, Codeine, Losartan, Morphine and related, Fentanyl, Nsaids, Rofecoxib, Sucralfate, Sulfa antibiotics, and Tramadol     REVIEW OF SYSTEMS    Review of Systems:  Gen:  Denies  fever, sweats, chills weigh loss  HEENT: Denies blurred vision, double vision, ear pain, eye pain, hearing loss, nose bleeds, sore throat Cardiac:  No dizziness, chest pain or heaviness, chest  tightness,edema Resp:   Denies cough or sputum porduction, shortness of breath,wheezing, hemoptysis,  Gi: Denies swallowing difficulty, stomach pain, nausea or vomiting, diarrhea, constipation, bowel incontinence Gu:  Denies bladder incontinence, burning urine Ext:   Denies Joint pain, stiffness or swelling Skin: Denies  skin rash, easy bruising  or bleeding or hives Endoc:  Denies polyuria, polydipsia , polyphagia or weight change Psych:   Denies depression, insomnia or hallucinations   Other:  All other systems negative   VS: BP 121/64 (BP Location: Left Leg)   Pulse 66   Temp 97.7 F (36.5 C)   Resp 16   Ht _0  (1.575 m)   Wt 81.3 kg   SpO2 96%   BMI 32.78 kg/m      PHYSICAL EXAM    GENERAL:NAD, no fevers, chills, no weakness no fatigue HEAD: Normocephalic, atraumatic.  EYES: Pupils equal, round, reactive to light. Extraocular muscles intact. No scleral icterus.  MOUTH: Moist mucosal membrane. Dentition intact. No abscess noted.  EAR, NOSE, THROAT: Clear without exudates. No external lesions.  NECK: Supple. No thyromegaly. No nodules. No JVD.  PULMONARY: decreased breath sounds on left  CARDIOVASCULAR: S1 and S2. Regular rate and rhythm. No murmurs, rubs, or gallops. No edema. Pedal pulses 2+ bilaterally.  GASTROINTESTINAL: Soft, nontender, nondistended. No masses. Positive bowel sounds. No hepatosplenomegaly.  MUSCULOSKELETAL: No swelling, clubbing, or edema. Range of motion full in all extremities.  NEUROLOGIC: Cranial nerves II through XII are intact. No gross focal neurological deficits. Sensation intact. Reflexes intact.  SKIN: No ulceration, lesions, rashes, or cyanosis. Skin warm and dry. Turgor intact.  PSYCHIATRIC: Mood, affect within normal limits. The patient is awake, alert and oriented x 3. Insight, judgment intact.       IMAGING    CT HEAD WO CONTRAST  Result Date: 05/24/2020 CLINICAL DATA:  Mental status change EXAM: CT HEAD WITHOUT CONTRAST  TECHNIQUE: Contiguous axial images were obtained from the base of the skull through the vertex without intravenous contrast. COMPARISON:  05/22/2020 FINDINGS: Brain: There is no acute intracranial hemorrhage, mass effect, or edema. No new loss of gray-white differentiation. Small chronic left cerebellar infarct. Patchy hypoattenuation in the supratentorial white matter likely reflects stable chronic microvascular ischemic changes. There is no extra-axial fluid collection. Ventricles and sulci are stable in size and configuration. Vascular: There is atherosclerotic calcification at the skull base. Skull: Calvarium is unremarkable. Sinuses/Orbits: Mild mucosal thickening. No acute orbital abnormality. Other: None. IMPRESSION: No acute intracranial abnormality. No significant change since recent prior study. Electronically Signed   By: Macy Mis M.D.   On: 05/24/2020 17:14   CT CHEST WO CONTRAST  Result Date: 05/27/2020 CLINICAL DATA:  Pneumonia.  Abnormal chest radiograph EXAM: CT CHEST WITHOUT CONTRAST TECHNIQUE: Multidetector CT imaging of the chest was performed following the standard protocol without IV contrast. COMPARISON:  Chest CT 05/18/2020, radiograph 05/27/2020 FINDINGS: Cardiovascular: Valvular calcification. Coronary artery calcification and aortic atherosclerotic calcification. Mediastinum/Nodes: 11 mm RIGHT lower paratracheal lymph node. Lungs/Pleura: Large volume LEFT pleural effusion occupying the near entirety of the LEFT hemithorax. There is passive atelectasis with complete atelectasis of the LEFT lower lobe and lingula. Small volume of LEFT upper lobe is aerated. Small moderate RIGHT pleural effusion with mild passive atelectasis. Upper Abdomen: Limited view of the liver, kidneys, pancreas are unremarkable. Normal adrenal glands. Musculoskeletal: No aggressive osseous lesion. Degenerative osteophytosis of the spine. IMPRESSION: 1. Progressive large LEFT pleural effusion now occupies the  near entirety of the LEFT hemithorax. Complete passive atelectasis of the LEFT lower lobe and lingula. Minimal aeration of the LEFT upper lobe. 2. Small to moderate RIGHT pleural effusion. 3. Coronary artery calcification and Aortic Atherosclerosis (ICD10-I70.0). Electronically Signed   By: Suzy Bouchard M.D.   On: 05/27/2020 18:06   [place] CHEST (PLEURAL EFFUSION)  Result  Date: 05/30/2020 CLINICAL DATA:  87 year old female with cough and shortness of breath, recent left thoracentesis. EXAM: CHEST ULTRASOUND COMPARISON:  05/28/2020, 05/29/2020 FINDINGS: Trace bilateral pleural effusions, right greater than left. IMPRESSION: Trace bilateral pleural effusions, right greater than left. No thoracentesis performed. Left-sided opacities on chest radiograph most likely indicative of atelectasis and or infiltrative airspace process. Ruthann Cancer, MD [facility] Electronically Signed   By: Ruthann Cancer MD   On: 05/30/2020 16:26   DG Chest Port 1 View  Result Date: 05/29/2020 CLINICAL DATA:  87 year old female with cough and progressive shortness of breath. Hypoxia on room air. Recent left side thoracentesis. EXAM: PORTABLE CHEST 1 VIEW COMPARISON:  Portable chest 05/28/2020 and earlier. FINDINGS: Portable AP semi upright view at 1021 hours. Improved lung volumes from yesterday. The patient is less rotated now. Stable cardiac size and mediastinal contours. Left lung ventilation remains improved compared to the CT on 05/27/2020, with residual lower lung opacity which is probably a combination of residual effusion and airspace disease. Right lung appears stable. No pneumothorax. Visualized tracheal air column is within normal limits. Stable visualized osseous structures. Un healed proximal right humerus fracture. Paucity of bowel gas in the upper abdomen. IMPRESSION: 1. Left lung ventilation remains improved since the CT on 05/27/2020 with residual lower lung  combined pleural effusion and airspace disease suspected. 2. No new cardiopulmonary abnormality. Electronically Signed   By: Genevie Ann M.D.   On: 05/29/2020 10:55   DG Chest Port 1 View  Result Date: 05/28/2020 CLINICAL DATA:  Status post left-sided thoracentesis EXAM: PORTABLE CHEST 1 VIEW COMPARISON:  CT chest and chest x-ray obtained yesterday FINDINGS: No evidence of pneumothorax following left-sided thoracentesis. Slightly improved aeration of the left upper lung. Persistent dense opacification of the left mid and lower chest which may represent persistent pleural fluid versus persistent atelectasis. The right lung remains clear. Chronic right proximal humeral fracture again noted. No acute osseous abnormality. Atherosclerotic calcifications present in the transverse aorta. IMPRESSION: 1. No evidence of pneumothorax following left-sided thoracentesis. 2. Improved aeration of the left upper lung but persistent opacification of the left mid and lower lung. Findings may represent residual atelectasis versus residual pleural fluid. Electronically Signed   By: Jacqulynn Cadet M.D.   On: 05/28/2020 11:24   DG Chest Port 1 View  Result Date: 05/27/2020 CLINICAL DATA:  Pneumonia. EXAM: PORTABLE CHEST 1 VIEW COMPARISON:  05/24/2020; 05/22/2020 FINDINGS: Grossly unchanged cardiac silhouette and mediastinal contours with persistent obscuration of the right heart border secondary to unchanged left-sided pleural effusion and associated left mid lower lung consolidative opacities, similar to the 05/22/2020 examination. Atherosclerotic plaque within the aortic arch and mitral valve calcifications. Mild pulmonary venous congestion within the otherwise well aerated right lung. No definite right-sided pleural effusion though the right costophrenic angle is excluded from view. No acute osseous abnormalities. IMPRESSION: 1. Similar appearing extensive left lung consolidative opacities and small left-sided effusion worrisome  for multifocal pneumonia. 2. Suspected pulmonary venous congestion within the otherwise well aerated right lung. Electronically Signed   By: Sandi Mariscal M.D.   On: 05/27/2020 09:30   DG Chest Port 1 View  Result Date: 05/24/2020 CLINICAL DATA:  Post left-sided thoracentesis. EXAM: PORTABLE CHEST 1 VIEW COMPARISON:  Chest radiograph-05/22/2020; 05/18/2020; chest CT-05/18/2020 FINDINGS: Grossly unchanged cardiac silhouette and mediastinal contours with atherosclerotic plaque within thoracic aorta. Dystrophic calcifications within the mitral valve annulus. Interval reduction/near resolution of left-sided pleural effusion post thoracentesis with improved aeration of the  left lung apex. Redemonstrated extensive left mid and lower lung heterogeneous/consolidative opacities with associated left-sided volume loss and deviation of the cardiomediastinal structures to the left. Pulmonary vasculature within the right lung remains indistinct. No definite right-sided pleural effusion. No acute osseous abnormalities. Redemonstrated chronic displaced fracture involving the anatomic neck of the right humerus, incompletely evaluated. IMPRESSION: 1. Interval reduction/near resolution of left-sided pleural effusion post thoracentesis. No pneumothorax. 2. Otherwise, similar findings of extensive left mid and lower lung heterogeneous/consolidative opacities with associated volume loss. Electronically Signed   By: Sandi Mariscal M.D.   On: 05/24/2020 12:15   DG Chest Port 1 View  Result Date: 05/22/2020 CLINICAL DATA:  Hypoxia EXAM: PORTABLE CHEST 1 VIEW COMPARISON:  05/18/2020 FINDINGS: Large left pleural effusion with worsening left upper consolidation. Chronic right humeral head fracture. Mild right chest opacity. IMPRESSION: Large left pleural effusion with worsening left upper consolidation. Electronically Signed   By: Ulyses Jarred M.D.   On: 05/22/2020 01:44   DG Chest Port 1 View  Result Date: 05/18/2020 CLINICAL DATA:   Shortness of breath and fever EXAM: PORTABLE CHEST 1 VIEW COMPARISON:  March 06, 2020 FINDINGS: There is extensive airspace opacity throughout most of the left lung. The right lung is clear. There is cardiomegaly with mild pulmonary venous hypertension. No adenopathy. There is aortic atherosclerosis. There is calcification in each carotid artery. No bone lesions. IMPRESSION: Widespread airspace opacity throughout much of the left lung consistent with multifocal pneumonia. Right lung clear. There is cardiomegaly with a degree of pulmonary vascular congestion. There is calcification in each carotid artery. Aortic Atherosclerosis (ICD10-I70.0). Electronically Signed   By: Lowella Grip III M.D.   On: 05/18/2020 09:08   CT HEAD CODE STROKE WO CONTRAST`  Result Date: 05/22/2020 CLINICAL DATA:  Code stroke.  Acute neurologic deficit EXAM: CT HEAD WITHOUT CONTRAST TECHNIQUE: Contiguous axial images were obtained from the base of the skull through the vertex without intravenous contrast. COMPARISON:  None. FINDINGS: Brain: There is no mass, hemorrhage or extra-axial collection. The size and configuration of the ventricles and extra-axial CSF spaces are normal. There is hypoattenuation of the periventricular white matter, most commonly indicating chronic ischemic microangiopathy. Vascular: No abnormal hyperdensity of the major intracranial arteries or dural venous sinuses. No intracranial atherosclerosis. Skull: The visualized skull base, calvarium and extracranial soft tissues are normal. Sinuses/Orbits: No fluid levels or advanced mucosal thickening of the visualized paranasal sinuses. No mastoid or middle ear effusion. The orbits are normal. ASPECTS [facility] Stroke Program Early CT Score) - Ganglionic level infarction (caudate, lentiform nuclei, internal capsule, insula, M1-M3 cortex): 7 - Supraganglionic infarction (M4-M6 cortex): 3 Total score (0-10 with 10 being normal): 10 IMPRESSION: 1. Chronic ischemic  microangiopathy without acute intracranial abnormality. 2. ASPECTS is 10. These results were called by telephone at the time of interpretation on 05/22/2020 at 12:35 am to provider [facility] , who verbally acknowledged these results. Electronically Signed   By: Ulyses Jarred M.D.   On: 05/22/2020 00:44   ECHOCARDIOGRAM LIMITED  Result Date: 05/23/2020    ECHOCARDIOGRAM LIMITED REPORT   Patient Name:   YARLIN BREISCH Date of Exam: 05/23/2020 Medical Rec #:  [id number]          Height:       62.0 in Accession #:    [id number]         Weight:       181.8 lb Date of Birth:  March 16, 1927  BSA:          1.836 m Patient Age:    36 years           BP:           133/36 mmHg Patient Gender: F                  HR:           56 bpm. Exam Location:  [facility] Procedure: Limited Echo, Color Doppler and Cardiac Doppler Indications:     Chest pain R07.9                  NSTEMI I21.4  History:         Patient has prior history of Echocardiogram examinations, most                  recent 02/07/2020. Risk Factors:Hypertension and Diabetes.  Sonographer:     Sherrie Sport RDCS (AE) Referring Phys:  [id number] Arvil Chaco Diagnosing Phys: Kathlyn Sacramento MD IMPRESSIONS  1. Left ventricular ejection fraction, by estimation, is 55 to 60%. The left ventricle has normal function. Left ventricular endocardial border not optimally defined to evaluate regional wall motion. Left ventricular diastolic function could not be evaluated.  2. Right ventricular systolic function is normal. The right ventricular size is normal. Tricuspid regurgitation signal is inadequate for assessing PA pressure.  3. Left atrial size was mildly dilated.  4. Moderate pleural effusion.  5. The mitral valve is abnormal. No evidence of mitral valve regurgitation. No evidence of mitral stenosis. Moderate mitral annular calcification.  6. The aortic valve is normal in structure. Aortic valve regurgitation is not visualized. Mild aortic valve stenosis. No  gradients were measured.  7. limited and challenging study. FINDINGS  Left Ventricle: Left ventricular ejection fraction, by estimation, is 55 to 60%. The left ventricle has normal function. Left ventricular endocardial border not optimally defined to evaluate regional wall motion. The left ventricular internal cavity size was normal in size. There is no left ventricular hypertrophy. Left ventricular diastolic function could not be evaluated. Right Ventricle: The right ventricular size is normal. No increase in right ventricular wall thickness. Right ventricular systolic function is normal. Tricuspid regurgitation signal is inadequate for assessing PA pressure. Left Atrium: Left atrial size was mildly dilated. Right Atrium: Right atrial size was normal in size. Pericardium: There is no evidence of pericardial effusion. Mitral Valve: The mitral valve is abnormal. Moderate mitral annular calcification. No evidence of mitral valve stenosis. Tricuspid Valve: The tricuspid valve is normal in structure. Tricuspid valve regurgitation is not demonstrated. No evidence of tricuspid stenosis. Aortic Valve: The aortic valve is normal in structure. Aortic valve regurgitation is not visualized. Mild aortic stenosis is present. Pulmonic Valve: The pulmonic valve was normal in structure. Pulmonic valve regurgitation is not visualized. No evidence of pulmonic stenosis. Aorta: The aortic root is normal in size and structure. Venous: The inferior vena cava was not well visualized. IAS/Shunts: No atrial level shunt detected by color flow Doppler. Additional Comments: There is a moderate pleural effusion. LEFT VENTRICLE PLAX 2D LVIDd:         3.92 cm LVIDs:         2.74 cm LV PW:         1.54 cm LV IVS:        0.97 cm LVOT diam:     2.00 cm LVOT Area:     3.14 cm  LEFT ATRIUM  Index LA diam:    5.10 cm 2.78 cm/m                        PULMONIC VALVE AORTA                 PV Vmax:        1.12 m/s Ao Root diam: 2.40 cm PV Peak  grad:   5.0 mmHg                       RVOT Peak grad: 12 mmHg   SHUNTS Systemic Diam: 2.00 cm Kathlyn Sacramento MD Electronically signed by Kathlyn Sacramento MD Signature Date/Time: 05/23/2020/10:19:48 AM    Final    CT CHEST ABDOMEN PELVIS WO CONTRAST  Result Date: 05/18/2020 CLINICAL DATA:  Chest pain or shortness of breath. Pleural effusion or pleurisy suspected EXAM: CT CHEST, ABDOMEN AND PELVIS WITHOUT CONTRAST TECHNIQUE: Multidetector CT imaging of the chest, abdomen and pelvis was performed following the standard protocol without IV contrast. COMPARISON:  01/13/2017 FINDINGS: CT CHEST FINDINGS Cardiovascular: Normal heart size. No pericardial effusion. Bulky atherosclerotic calcification of the aorta and great vessels. Coronary atherosclerosis. Mediastinum/Nodes: No adenopathy or mass. Moderate sliding hiatal hernia. Lungs/Pleura: Dense consolidation throughout the left lung with poor visualization of central airways. Small left pleural effusion. Musculoskeletal: Nonacute right humeral neck fracture with marked anterior displacement that is similar to initial diagnosis February 2022 by radiography. Extensive spondylosis and thoracic spine ankylosis. Multilevel degenerative facet spurring especially at the open T6-7 level where there is biforaminal impingement. CT ABDOMEN PELVIS FINDINGS Hepatobiliary: No focal liver abnormality.No evidence of biliary obstruction or stone. Pancreas: Unremarkable. Spleen: Unremarkable. Adrenals/Urinary Tract: Negative adrenals. No hydronephrosis or stone. Left upper pole renal lesion which is a cyst by ultrasound in 2020. Peripherally calcified structure in the left upper pole which was also described on prior. Unremarkable bladder. Stomach/Bowel: No obstruction. No visible bowel inflammation. Numerous distal colonic diverticula. Distal colonic stool with moderate distension of the rectum. No superimposed rectal wall thickening. Vascular/Lymphatic: No acute vascular abnormality.  No mass or adenopathy. Reproductive:Hysterectomy Other: No ascites or pneumoperitoneum. Musculoskeletal: No acute abnormalities. Severe lumbar spine degeneration which is generalized. Generalized osteopenia. Hip osteoarthritis that is particularly advanced on the right. IMPRESSION: 1. Extensive left-sided pneumonia opacifying most of the left lung. Small left parapneumonic effusion. 2. No acute intra-abdominal finding. 3. Numerous chronic/known incidental findings which are described above. Electronically Signed   By: Monte Fantasia M.D.   On: 05/18/2020 11:21   US THORACENTESIS ASP PLEURAL SPACE W/IMG GUIDE  Result Date: 05/28/2020 INDICATION: Patient with history of multifocal pneumonia with left sided pleural effusion. Request is for therapeutic and diagnostic left-sided thoracentesis EXAM: ULTRASOUND GUIDED THERAPEUTIC AND DIAGNOSTIC THORACENTESIS MEDICATIONS: Lidocaine 1% 10 mL COMPLICATIONS: None immediate. PROCEDURE: An ultrasound guided thoracentesis was thoroughly discussed with the patient and questions answered. The benefits, risks, alternatives and complications were also discussed. The patient understands and wishes to proceed with the procedure. Written consent was obtained. Ultrasound was performed to localize and mark an adequate pocket of fluid in the left chest. The area was then prepped and draped in the normal sterile fashion. 1% Lidocaine was used for local anesthesia. Under ultrasound guidance a 6 Fr Safe-T-Centesis catheter was introduced. Thoracentesis was performed. The catheter was removed and a dressing applied. FINDINGS: A total of approximately 600 mL of straw-colored fluid was removed. Samples were sent to the laboratory as requested by the clinical team.  IMPRESSION: Successful ultrasound guided therapeutic and diagnostic left-sided thoracentesis yielding 600 mL of pleural fluid. Read by: Rushie Nyhan, NP Electronically Signed   By: Corrie Mckusick D.O.   On: 05/28/2020 11:33    US THORACENTESIS ASP PLEURAL SPACE W/IMG GUIDE  Result Date: 05/24/2020 INDICATION: Symptomatic left sided pleural effusion. Please perform ultrasound-guided thoracentesis for diagnostic and therapeutic purposes. EXAM: US THORACENTESIS ASP PLEURAL SPACE W/IMG GUIDE COMPARISON:  Chest radiograph-05/22/2020; 05/18/2020; chest CT-05/18/2020 MEDICATIONS: None. COMPLICATIONS: None immediate. TECHNIQUE: Informed written consent was obtained from the the patient and the patient's daughter after a discussion of the risks, benefits and alternatives to treatment. A timeout was performed prior to the initiation of the procedure. Initial ultrasound scanning demonstrates a small left-sided pleural effusion with dominant component loculated regional to the left upper lobe. The posterior aspect of the left upper/mid chest was prepped and draped in the usual sterile fashion. 1% lidocaine was used for local anesthesia. Under direct ultrasound guidance, an 8 Fr Safe-T-Centesis catheter was introduced. The thoracentesis was performed. The catheter was removed and a dressing was applied. The patient tolerated the procedure well without immediate post procedural complication. The patient was escorted to have an upright chest radiograph. FINDINGS: A total of approximately 250 cc of serous fluid was removed. Requested samples were sent to the laboratory. IMPRESSION: Successful ultrasound-guided left sided thoracentesis yielding 250 cc of pleural fluid. Electronically Signed   By: Sandi Mariscal M.D.   On: 05/24/2020 12:20      ASSESSMENT/PLAN   Acute on chronic hypoxemic respiratory failure S/p thoracentesis with negative gm stain , neutrophil predominant exudate due to diuresis . -patient significantly clinically improved, looks better then she did in office 4 months ago. -She has CKD and CHF with cardiology following - appreciate input -she has reactive airway disease with asthma and Obesity hypoventilation overlap and  does benefit from BIPAP nocturnally. -She is with severe deconditioning which is precluding her from taking vigorous breath and she should have IS and Chest PT -reviewed CT chest no pneumonia no mass no pneumonthorax no fibrosis, +atelectasis LLL and effusion left side -Reviewed care plan with daughter and patient at bedside -Met with Attending physician and reviewed imaging and plan together.  -repeat thoracentesis - patient feels better - 4/5 - reviewed cxr - improved  -4/6 - repeat thoracentesis - not enough fluid, cancelled  - 4/7 repeat CT chest of residual PNA/atelecatsis at Left base      Thank you for allowing me to participate in the care of this patient.    Patient/Family are satisfied with care plan and all questions have been answered.  This document was prepared using Dragon voice recognition software and may include unintentional dictation errors.     Ottie Glazier, M.D.  Division of [place]

## 2020-05-31 NOTE — Progress Notes (Signed)
PROGRESS NOTE    Amy [name]  UVO:[id number] DOB: Jul 02, 1927 DOA: 05/18/2020 PCP: Alvester Morin, MD   Brief Narrative:  Patient was admitted on 05/18/2020 with shortness of breath and abdominal pain.  Past medical history of hypertension, hyperlipidemia, type 2 diabetes, COPD, CAD, hypothyroidism, atrial fibrillation, seizure, chronic kidney disease, anemia, CHF.  She was found to have clinical sepsis with multifocal pneumonia and left pleural effusion.  The patient has received 10 days of antibiotics so antibiotics were stopped.  She had a thoracentesis on 05/24/2020 removing 250 mL of fluid.  Another thoracentesis on 05/28/2020 removing 600 mL of fluid.  Nothing growing on a either culture.  Follow-up cytology of the second thoracentesis.  Repeat chest x-ray little bit better than previous x-rays.  Pulmonary does not want to do another thoracentesis at this point.  Patient was also found to have NSTEMI.  Patient also had acute metabolic encephalopathy and was barely responsive 5 days ago.  Patient also has bilateral feet ankle and leg pain and her Lyrica was restarted and slowly improving.  I did put on low-dose prednisone also.   Assessment & Plan:   Principal Problem:   Multifocal pneumonia Active Problems:   Acquired hypothyroidism   HLD (hyperlipidemia)   Essential hypertension   Iron deficiency anemia   Chronic diastolic CHF (congestive heart failure) (HCC)   Anemia of chronic disease   COPD with chronic bronchitis (HCC)   Severe sepsis (HCC)   CKD (chronic kidney disease) stage 4, GFR 15-29 ml/min (HCC)   HTN (hypertension)   GERD (gastroesophageal reflux disease)   CAD (coronary artery disease)   NSTEMI (non-ST elevated myocardial infarction) (HCC)   AF (paroxysmal atrial fibrillation) (HCC)   Seizure (HCC)   Elevated troponin   Abdominal pain   Diarrhea   Acute respiratory failure with hypoxia (HCC)   Recurrent left pleural effusion   Acute metabolic  encephalopathy   Pain  Multifocal pneumonia Recurrent pleural effusion Severe sepsis secondary to above Patient with multiple thoracentesis during admission Thora 3/31, 250 cc fluid Thora 4/4, 600 cc fluid Follow-up chest x-ray shows improved air entry but persistent effusion Pulmonary ordered repeat Thora however upon ultrasound there was insufficient fluid so no tap was performed Patient has been weaned to room air as of 4/7 Plan: Continue low-dose torsemide Patient is completed 10-day course of antibiotics, no indication to restart at this time Discharge planning however may be an issue as patient is out of SNF days  Bilateral pain in feet and ankles Suspect neuropathic etiology Improved with Lyrica Plan: Continue Lyrica Continue low-dose prednisone, will taper  Acute metabolic encephalopathy Suspect multifactorial etiology in the setting of sepsis and infection Mental status appears to be at baseline CT scan head negative x2  Plan: Delirium precautions Frequent neuro checks  NSTEMI Medical management per cardiology Was on heparin drip 2 days beginning of hospital course Troponin peak 1196 Plan: Telemetry monitoring Continue Plavix continue statin  Acute hypoxemic respiratory failure, resolved No home oxygen requirement Low pulse ox 85% room air Weaned to room air as of 4/7 Plan: Pulmonary following [facility] per pulmonary recommendations Discharge planning   Acute kidney injury on chronic kidney disease stage IIIb Peak creatinine 1.84 Now downtrending Plan: Avoid hypotension Avoid nonessential nephrotoxins Continue low-dose torsemide Monitor daily kidney function  Essential hypertension Reasonable control over interval Continue low-dose torsemide Continue Norvasc  PAF Rate controlled No anticoagulation due to history of GI bleed  Seizure disorder Continue Keppra  Iron deficiency anemia P.o.  iron supplementation  Weakness Functional  decline Therapy evaluations Therapy is recommended skilled nursing facility however patient is out of SNF days.  This is a difficult situation.  Case management is aware and will reach out to the patient's family to explain.  If we are out of options and patient and family are unable to provide out-of-pocket cost for skilled facility we may be obligated to discharge home which is not ideal     DVT prophylaxis: Lovenox Code Status: DNR Family Communication: Daughter [name] [phone] on 4/7 disposition Plan: Status is: Inpatient  Remains inpatient appropriate because:Inpatient level of care appropriate due to severity of illness   Dispo: The patient is from: Home              Anticipated d/c is to: Unknown at this time.  SNF versus home with home health              Patient currently is not medically stable to d/c.   Difficult to place patient Yes   Patient is rapidly approaching medical stability.  I would say if she remains hemodynamically stable on room air over the course of the night she will be medically stable for discharge as of 4/8.  Unfortunately she is out of skilled nursing facility days.  If we are unable to find a facility and the family is unable to provide out-of-pocket cost we may be obligated to send her home with home health.           Level of care: Progressive Cardiac  Consultants:   Cardiology  Pulmonology  Procedures:   Thoracentesis x2  Antimicrobials:   None   Subjective: Patient seen and examined.  Up in chair.  Shortness of breath improved.  Answers all questions appropriately.  No pain complaints  Objective: Vitals:   05/30/20 1917 05/31/20 0446 05/31/20 0757 05/31/20 1125  BP: 127/78 (!) 165/63 (!) 135/56 121/64  Pulse: (!) 58 62 65 66  Resp: 16 18 18 16   Temp: 98.6 F (37 C) 97.8 F (36.6 C) (!) 97.5 F (36.4 C) 97.7 F (36.5 C)  TempSrc:  Oral Oral   SpO2: 97% 96% 93% 96%  Weight:      Height:        Intake/Output  Summary (Last 24 hours) at 05/31/2020 1334 Last data filed at 05/31/2020 1000 Gross per 24 hour  Intake 120 ml  Output --  Net 120 ml   Filed Weights   05/26/20 0625 05/27/20 0500 05/28/20 0424  Weight: 82.8 kg 82.9 kg 81.3 kg    Examination:  General exam: No acute distress.  Appears frail Respiratory system: Bilateral crackles.  Greater on right.  Normal work of breathing.  Room air  cardiovascular system: S1-S2, regular rate and rhythm, no murmur Gastrointestinal system: Abdomen is nondistended, soft and nontender. No organomegaly or masses felt. Normal bowel sounds heard. Central nervous system: Alert, oriented x2, no focal deficits Extremities: Symmetric 5 x 5 power. Skin: No rashes, lesions or ulcers Psychiatry: Judgement and insight appear normal. Mood & affect appropriate.     Data Reviewed: I have personally reviewed following labs and imaging studies  CBC: Recent Labs  Lab 05/25/20 0600 05/26/20 0432 05/27/20 0543 05/28/20 0646 05/30/20 0839  WBC 13.3* 13.9* 12.3* 11.0* 12.1*  NEUTROABS  --   --   --   --  8.4*  HGB 8.8* 9.0* 9.6* 9.9* 13.2  HCT 27.9* 28.8* 30.3* 31.6* 43.1  MCV 94.9 95.0 93.8 94.3 95.8  PLT  190 219 235 263 027   Basic Metabolic Panel: Recent Labs  Lab 05/25/20 0731 05/26/20 0432 05/27/20 0543 05/28/20 0646 05/29/20 1010 05/30/20 0742 05/31/20 1009  NA 141 143 144 145 142 142 142  K 4.5 4.2 4.2 4.3 5.1 4.9 4.6  CL 112* 113* 113* 114* 113* 113* 110  CO2 22 26 24 26  20* 24 24  GLUCOSE 112* 126* 138* 119* 118* 107* 148*  BUN 42* 41* 35* 37* 41* 42* 42*  CREATININE 1.74* 1.49* 1.39* 1.30* 1.29* 1.20* 1.21*  CALCIUM 8.3* 8.2* 8.2* 8.2* 8.3* 8.1* 7.9*  MG 1.9 1.8 1.8 1.9  --   --   --    GFR: Estimated Creatinine Clearance: 29.9 mL/min (A) (by C-G formula based on SCr of 1.21 mg/dL (H)). Liver Function Tests: No results for input(s): AST, ALT, ALKPHOS, BILITOT, PROT, ALBUMIN in the last 168 hours. No results for input(s): LIPASE,  AMYLASE in the last 168 hours. No results for input(s): AMMONIA in the last 168 hours. Coagulation Profile: No results for input(s): INR, PROTIME in the last 168 hours. Cardiac Enzymes: No results for input(s): CKTOTAL, CKMB, CKMBINDEX, TROPONINI in the last 168 hours. BNP (last 3 results) No results for input(s): PROBNP in the last 8760 hours. HbA1C: No results for input(s): HGBA1C in the last 72 hours. CBG: No results for input(s): GLUCAP in the last 168 hours. Lipid Profile: No results for input(s): CHOL, HDL, LDLCALC, TRIG, CHOLHDL, LDLDIRECT in the last 72 hours. Thyroid Function Tests: No results for input(s): TSH, T4TOTAL, FREET4, T3FREE, THYROIDAB in the last 72 hours. Anemia Panel: No results for input(s): VITAMINB12, FOLATE, FERRITIN, TIBC, IRON, RETICCTPCT in the last 72 hours. Sepsis Labs: No results for input(s): PROCALCITON, LATICACIDVEN in the last 168 hours.  Recent Results (from the past 240 hour(s))  MRSA PCR Screening     Status: None   Collection Time: 05/23/20  8:16 AM   Specimen: Nasal Mucosa; Nasopharyngeal  Result Value Ref Range Status   MRSA by PCR NEGATIVE NEGATIVE Final    Comment:        The GeneXpert MRSA Assay (FDA approved for NASAL specimens only), is one component of a comprehensive MRSA colonization surveillance program. It is not intended to diagnose MRSA infection nor to guide or monitor treatment for MRSA infections. Performed at [facility], [address] [place], [place] [address]   Body fluid culture w Gram Stain     Status: None   Collection Time: 05/24/20 11:58 AM   Specimen: PATH Cytology Pleural fluid  Result Value Ref Range Status   Specimen Description   Final    PLEURAL Performed at [facility], [address], [place], [place] [address]    Special Requests   Final    NONE Performed at [facility], [address] [place], [place] [address]    Gram Stain   Final    FEW WBC PRESENT,  PREDOMINANTLY MONONUCLEAR NO ORGANISMS SEEN    Culture   Final    NO GROWTH 3 DAYS Performed at [facility], [place] [address] [place], [place] [address]    Report Status 05/27/2020 FINAL  Final  Body fluid culture w Gram Stain     Status: None (Preliminary result)   Collection Time: 05/28/20 11:00 AM   Specimen: PATH Cytology Pleural fluid  Result Value Ref Range Status   Specimen Description   Final    PLEURAL Performed at [facility], [place],  [place], [place] [address]    Special Requests   Final    NONE Performed at [facility], [address] [place], [place] [address]    Gram Stain   Final    RARE WBC PRESENT, PREDOMINANTLY MONONUCLEAR NO ORGANISMS SEEN    Culture   Final    NO GROWTH 3 DAYS Performed at [facility] [address], [place], [place] [address]    Report Status PENDING  Incomplete         Radiology Studies: [place] CHEST (PLEURAL EFFUSION)  Result Date: 05/30/2020 CLINICAL DATA:  87 year old female with cough and shortness of breath, recent left thoracentesis. EXAM: CHEST ULTRASOUND COMPARISON:  05/28/2020, 05/29/2020 FINDINGS: Trace bilateral pleural effusions, right greater than left. IMPRESSION: Trace bilateral pleural effusions, right greater than left. No thoracentesis performed. Left-sided opacities on chest radiograph most likely indicative of atelectasis and or infiltrative airspace process. Ruthann Cancer, MD [facility] Radiology Electronically Signed   By: Ruthann Cancer MD   On: 05/30/2020 16:26        Scheduled Meds: . amLODipine  2.5 mg Oral Daily  . atorvastatin  20 mg Oral Daily  . bisacodyl  10 mg Rectal QHS  . Chlorhexidine Gluconate Cloth  6 each Topical Daily  . clopidogrel  75 mg Oral Daily  . dextromethorphan-guaiFENesin  1 tablet Oral BID  . doxazosin  2 mg Oral Q12H  . enoxaparin (LOVENOX) injection  30 mg Subcutaneous Q24H  .  ipratropium-albuterol  3 mL Nebulization BID  . iron polysaccharides  150 mg Oral Daily  . levETIRAcetam  250 mg Oral BID  . levothyroxine  100 mcg Oral Q0600  . lidocaine  1 patch Transdermal Q24H  . magnesium oxide  400 mg Oral Daily  . mometasone-formoterol  2 puff Inhalation BID  . montelukast  10 mg Oral QHS  . pantoprazole  20 mg Oral Daily  . predniSONE  10 mg Oral Q breakfast  . pregabalin  50 mg Oral q AM   And  . pregabalin  100 mg Oral QHS  . sodium chloride flush  10-40 mL Intracatheter Q12H  . sodium phosphate  1 enema Rectal Daily  . torsemide  10 mg Oral Daily   Continuous Infusions:   LOS: 13 days    Time spent: 25 minutes    Sidney Ace, MD [facility] Pager [phone]  If 7PM-7AM, please contact night-coverage  05/31/2020, 1:34 PM

## 2020-06-01 ENCOUNTER — Inpatient Hospital Stay: Payer: Medicare Other

## 2020-06-01 DIAGNOSIS — Z66 Do not resuscitate: Secondary | ICD-10-CM | POA: Diagnosis not present

## 2020-06-01 DIAGNOSIS — Z515 Encounter for palliative care: Secondary | ICD-10-CM | POA: Diagnosis not present

## 2020-06-01 DIAGNOSIS — Z7189 Other specified counseling: Secondary | ICD-10-CM

## 2020-06-01 DIAGNOSIS — J189 Pneumonia, unspecified organism: Secondary | ICD-10-CM | POA: Diagnosis not present

## 2020-06-01 LAB — CBC
HCT: 33 % — ABNORMAL LOW (ref 36.0–46.0)
Hemoglobin: 10.4 g/dL — ABNORMAL LOW (ref 12.0–15.0)
MCH: 29.6 pg (ref 26.0–34.0)
MCHC: 31.5 g/dL (ref 30.0–36.0)
MCV: 94 fL (ref 80.0–100.0)
Platelets: 288 10*3/uL (ref 150–400)
RBC: 3.51 MIL/uL — ABNORMAL LOW (ref 3.87–5.11)
RDW: 14.9 % (ref 11.5–15.5)
WBC: 7.9 10*3/uL (ref 4.0–10.5)
nRBC: 0 % (ref 0.0–0.2)

## 2020-06-01 LAB — BODY FLUID CULTURE W GRAM STAIN: Culture: NO GROWTH

## 2020-06-01 LAB — BASIC METABOLIC PANEL
Anion gap: 7 (ref 5–15)
BUN: 45 mg/dL — ABNORMAL HIGH (ref 8–23)
CO2: 26 mmol/L (ref 22–32)
Calcium: 8.1 mg/dL — ABNORMAL LOW (ref 8.9–10.3)
Chloride: 112 mmol/L — ABNORMAL HIGH (ref 98–111)
Creatinine, Ser: 1.07 mg/dL — ABNORMAL HIGH (ref 0.44–1.00)
GFR, Estimated: 49 mL/min — ABNORMAL LOW (ref >60)
Glucose, Bld: 99 mg/dL (ref 70–99)
Potassium: 4.5 mmol/L (ref 3.5–5.1)
Sodium: 145 mmol/L (ref 135–145)

## 2020-06-01 LAB — CYTOLOGY - NON PAP

## 2020-06-01 MED ORDER — SIMETHICONE 80 MG PO CHEW
160.0000 mg | CHEWABLE_TABLET | Freq: Four times a day (QID) | ORAL | Status: DC | PRN
  Filled 2020-06-01: qty 2

## 2020-06-01 MED ORDER — FUROSEMIDE 10 MG/ML IJ SOLN
80.0000 mg | Freq: Every day | INTRAMUSCULAR | Status: DC
  Administered 2020-06-01 – 2020-06-02 (×2): 80 mg via INTRAVENOUS
  Filled 2020-06-01 (×2): qty 8

## 2020-06-01 MED ORDER — ENOXAPARIN SODIUM 40 MG/0.4ML ~~LOC~~ SOLN
40.0000 mg | SUBCUTANEOUS | Status: DC
  Administered 2020-06-01 – 2020-06-02 (×2): 40 mg via SUBCUTANEOUS
  Filled 2020-06-01 (×2): qty 0.4

## 2020-06-01 MED ORDER — ALBUMIN HUMAN 25 % IV SOLN
12.5000 g | Freq: Once | INTRAVENOUS | Status: AC
  Administered 2020-06-01: 12.5 g via INTRAVENOUS
  Filled 2020-06-01: qty 50

## 2020-06-01 MED ORDER — PREDNISONE 10 MG PO TABS
10.0000 mg | ORAL_TABLET | Freq: Every day | ORAL | Status: DC
  Administered 2020-06-01 – 2020-06-02 (×2): 10 mg via ORAL
  Filled 2020-06-01 (×2): qty 1

## 2020-06-01 NOTE — Progress Notes (Signed)
Pulmonary Medicine          Date: 06/01/2020,   MRN# [id number] Amy [name] 11-Mar-1927     AdmissionWeight: 86.2 kg                 CurrentWeight: 80.4 kg   Referring physician: Dr Earleen Newport    CHIEF COMPLAINT:   Multifocal Pneumonia with parapneumonic effusion.   HISTORY OF PRESENT ILLNESS   As per admission h/p Patient states that she started having abdominal pain which was located in the left side of the abdomen, associated with nausea and diarrhea.  She has had 3-4 times of loose stool bowel movement.  She also had fever of 101 in facility, currently body temperature is 97.5 in ED. Patient has cough and shortness of breath which has been progressively worsening.  She was found to have oxygen desaturation to 85% on room air, which improved to 94% on 5 L oxygen.  Denies chest pain.  No rectal bleeding or dark stool.  Pt was found to have WBC 4.7, lactic acid 2.9, 1.5, INR 1.1, PTT 31, troponin level 638, 933, positive Covid PCR, renal function close to baseline, temperature 97.5, softer blood pressure 93/39, heart rate 98, RR 28.  Chest x-ray showed multifocal infiltration mainly in the left side, vascular congestion and cardiomegaly.  CT of the chest/abdomen/pelvis showed left lower lobe infiltration,  no acute issues intra-abdominally. After 9 day pneumonia tx and thoracentesis patient still has bilateral infiltrates and PCCM consultation placed for additional evaluation and management.   05/28/20- Patient is s/p thoracentesis, she feels better already. She is working with [employer] therapy and feels that after first session she is already breathing better.  She had OT today. We discussed using incentive spiroemetry.  I discussed her care plan with Dr Leslye Peer today.   05/29/20- repeat CXR this am with residual effusion on left and bibasilar atelectasis.  We discussed possibly needing repeat thoracentesis.  She is on room air , daughter in room. I reviwed plan with  family , Respiratory therapist Demetria and Attending physician Dr Leslye Peer. We will focus on continuing medical mgt, placement, PT/OT and outpatient follow up.   05/30/20- patient examined at bedside. She continues to be with SOB and hypoxemia on 2L/min which is new for her. She does still have fluid around left lung and felt most improved post thoracentesis. She used Metaneb and waited another 24h with diuresis but has not been able to improve further. We agreed on repeat thoracentesis today, patient may sign her own consent  05/31/20- patient is improved, she had repeat thoracentesis ordered with minimal effusions noted on [place] and no tap performed.  She is off nasal canula oxygen now on room air.  She is still with decreased air entry on left which may just be from atelectasis and if that's the case we can initiate DC planning to SNF per PT recommendation.  Will obtain non conrasted CT chest today to further evaluate.    PAST MEDICAL HISTORY   Past Medical History:  Diagnosis Date  . Bladder incontinence   . CAD (coronary artery disease)    a. 05/2009 Cath: LAD 90p (Xience 2.75 x 12 mm DES), 12m D1 40, LCx 40p/m, RCA 30/40/30p/m, RCA 30/25d;  b.  12/2011 Lexiscan MV: no ischemia, breast attenuation artifact, normal EF-->Low risk; c. 10/2016 MV: fixed apical defect, most likely apical thinning and attenuation, No ischemia, EF 60%.  . Cancer ([facility]    ovarian  .  Carotid arterial disease w/ R Carotid Bruit (HCC)    a. 08/2016 Carotid U/S: 40-59% bilat ICA stenosis - f/u 1 yr.  . Chronic diastolic (congestive) heart failure ([place])    a. Echo 09/2015: EF 55-60% w/ Grade 1 DD, sev Ca2+ MV annulus, mildly dil LA; b. 09/2016 Echo: EF 65-70%, Gr2 DD, mildly dil LA/RA, nl RV fxn.  . Chronic Dyspnea on exertion   . CKD (chronic kidney disease) stage 3, GFR 30-59 ml/min (HCC)   . Degenerative arthritis of knee    bilateral knees  . Diabetes mellitus    Type II  . GIB (gastrointestinal bleeding)    a. 08/2016  Admission w/ presyncope/anemia/melena-->req Transfusion-->endo ok, colonoscopy w/ polyps but no source of bleeding (most likely diverticular).  . Hiatal hernia   . Hypertension   . Iron deficiency   . Menopausal symptoms   . Morbid obesity ([place])   . Renal insufficiency   . Thyroid disease    hypothyroidism     SURGICAL HISTORY   Past Surgical History:  Procedure Laterality Date  . COLONOSCOPY  2015  . COLONOSCOPY WITH PROPOFOL N/A 09/25/2016   Procedure: COLONOSCOPY WITH PROPOFOL;  Surgeon: [name], MD;  Location: [facility] ENDOSCOPY;  Service: Gastroenterology;  Laterality: N/A;  . COLONOSCOPY WITH PROPOFOL N/A 09/26/2016   Procedure: COLONOSCOPY WITH PROPOFOL;  Surgeon: [name], MD;  Location[facility] ENDOSCOPY;  Service: Gastroenterology;  Laterality: N/A;  . ESOPHAGOGASTRODUODENOSCOPY (EGD) WITH PROPOFOL N/A 09/25/2016   Procedure: ESOPHAGOGASTRODUODENOSCOPY (EGD) WITH PROPOFOL;  Surgeon: [name], MD;  Location: [facility] ENDOSCOPY;  Service: Gastroenterology;  Laterality: N/A;  . RENAL ANGIOGRAPHY N/A 02/07/2019   Procedure: RENAL ANGIOGRAPHY;  Surgeon: [name], MD;  Location: [place] CV LAB;  Service: Cardiovascular;  Laterality: N/A;  . REPLACEMENT TOTAL KNEE BILATERAL    . TOTAL VAGINAL HYSTERECTOMY     ovarian mass, not cancerous  . UPPER GI ENDOSCOPY  2015     FAMILY HISTORY   Family History  Problem Relation Age of Onset  . Breast cancer Mother 64     SOCIAL HISTORY   Social History   Tobacco Use  . Smoking status: Never Smoker  . Smokeless tobacco: Never Used  Vaping Use  . Vaping Use: Never used  Substance Use Topics  . Alcohol use: No  . Drug use: No     MEDICATIONS    Home Medication:    Current Medication:  Current Facility-Administered Medications:  .  acetaminophen (TYLENOL) tablet 650 mg, 650 mg, Oral, Q6H PRN, Oswald Hillock, RPH, 650 mg at 05/31/20 1009 .  albuterol (PROVENTIL) (2.5 MG/3ML) 0.083% nebulizer solution 2.5 mg, 2.5  mg, Nebulization, Q4H PRN, Ivor Costa, MD .  amLODipine (NORVASC) tablet 2.5 mg, 2.5 mg, Oral, Daily, Leslye Peer, Richard, MD, 2.5 mg at 05/31/20 0906 .  atorvastatin (LIPITOR) tablet 20 mg, 20 mg, Oral, Daily, Oswald Hillock, RPH, 20 mg at 05/31/20 2116 .  bisacodyl (DULCOLAX) suppository 10 mg, 10 mg, Rectal, QHS, Wieting, Richard, MD, 10 mg at 05/25/20 2050 .  Chlorhexidine Gluconate Cloth 2 % PADS 6 each, 6 each, Topical, Daily, Loletha Grayer, MD, 6 each at 05/31/20 1628 .  clopidogrel (PLAVIX) tablet 75 mg, 75 mg, Oral, Daily, Ivor Costa, MD, 75 mg at 05/31/20 0904 .  dextromethorphan-guaiFENesin (MUCINEX DM) 30-600 MG per 12 hr tablet 1 tablet, 1 tablet, Oral, BID, Wyvonnia Dusky, MD, 1 tablet at 05/31/20 2116 .  docusate sodium (COLACE) capsule 100 mg, 100 mg, Oral, BID  PRN, Ivor Costa, MD .  doxazosin (CARDURA) tablet 2 mg, 2 mg, Oral, Q12H, Sreenath, Sudheer B, MD, 2 mg at 05/31/20 2119 .  enoxaparin (LOVENOX) injection 30 mg, 30 mg, Subcutaneous, Q24H, Wieting, Richard, MD, 30 mg at 05/31/20 2118 .  ipratropium-albuterol (DUONEB) 0.5-2.5 (3) MG/3ML nebulizer solution 3 mL, 3 mL, Nebulization, BID, Wyvonnia Dusky, MD, 3 mL at 06/01/20 0714 .  iron polysaccharides (NIFEREX) capsule 150 mg, 150 mg, Oral, Daily, Ivor Costa, MD, 150 mg at 05/31/20 0906 .  levETIRAcetam (KEPPRA) tablet 250 mg, 250 mg, Oral, BID, Oswald Hillock, RPH, 250 mg at 05/31/20 2115 .  levothyroxine (SYNTHROID) tablet 100 mcg, 100 mcg, Oral, Q0600, Wyvonnia Dusky, MD, 100 mcg at 06/01/20 0649 .  lidocaine (LIDODERM) 5 % 1 patch, 1 patch, Transdermal, Q24H, Sreenath, Sudheer B, MD, 1 patch at 05/31/20 1519 .  magnesium oxide (MAG-OX) tablet 400 mg, 400 mg, Oral, Daily, Leslye Peer, Richard, MD, 400 mg at 05/31/20 0905 .  melatonin tablet 5 mg, 5 mg, Oral, QHS PRN, Ivor Costa, MD, 5 mg at 05/31/20 2115 .  mometasone-formoterol (DULERA) 200-5 MCG/ACT inhaler 2 puff, 2 puff, Inhalation, BID, Ivor Costa, MD, 2  puff at 05/31/20 2100 .  montelukast (SINGULAIR) tablet 10 mg, 10 mg, Oral, QHS, Ivor Costa, MD, 10 mg at 05/31/20 2116 .  nitroGLYCERIN (NITROSTAT) SL tablet 0.4 mg, 0.4 mg, Sublingual, Q5 Min x 3 PRN, Ivor Costa, MD .  ondansetron [facility]) injection 4 mg, 4 mg, Intravenous, Q8H PRN, Ivor Costa, MD .  pantoprazole (PROTONIX) EC tablet 20 mg, 20 mg, Oral, Daily, Oswald Hillock, RPH, 20 mg at 05/31/20 0904 .  polyethylene glycol (MIRALAX / GLYCOLAX) packet 17 g, 17 g, Oral, Daily PRN, Ivor Costa, MD .  predniSONE (DELTASONE) tablet 5 mg, 5 mg, Oral, Q breakfast, Sreenath, Sudheer B, MD .  pregabalin (LYRICA) capsule 50 mg, 50 mg, Oral, q AM, 50 mg at 05/31/20 0921 **AND** pregabalin (LYRICA) capsule 100 mg, 100 mg, Oral, QHS, Sreenath, Sudheer B, MD, 100 mg at 05/31/20 2115 .  sodium chloride flush (NS) 0.9 % injection 10-40 mL, 10-40 mL, Intracatheter, Q12H, Gollan, Kathlene November, MD, 10 mL at 05/31/20 2100 .  sodium chloride flush (NS) 0.9 % injection 10-40 mL, 10-40 mL, Intracatheter, PRN, Rockey Situ, Kathlene November, MD .  sodium phosphate (FLEET) 7-19 GM/118ML enema 1 enema, 1 enema, Rectal, Daily, Loletha Grayer, MD, 1 enema at 05/25/20 0901 .  torsemide (DEMADEX) tablet 10 mg, 10 mg, Oral, Daily, Leslye Peer, Richard, MD, 10 mg at 05/31/20 0905 .  traMADol (ULTRAM) tablet 50 mg, 50 mg, Oral, Q12H PRN, Wyvonnia Dusky, MD, 50 mg at 05/31/20 2115  Facility-Administered Medications Ordered in Other Encounters:  .  0.9 %  sodium chloride infusion, , Intravenous, Continuous, Finnegan, Kathlene November, MD    ALLERGIES   Atenolol, Codeine, Losartan, Morphine and related, Fentanyl, Nsaids, Rofecoxib, Sucralfate, Sulfa antibiotics, and Tramadol     REVIEW OF SYSTEMS    Review of Systems:  Gen:  Denies  fever, sweats, chills weigh loss  HEENT: Denies blurred vision, double vision, ear pain, eye pain, hearing loss, nose bleeds, sore throat Cardiac:  No dizziness, chest pain or heaviness, chest  tightness,edema Resp:   Denies cough or sputum porduction, shortness of breath,wheezing, hemoptysis,  Gi: Denies swallowing difficulty, stomach pain, nausea or vomiting, diarrhea, constipation, bowel incontinence Gu:  Denies bladder incontinence, burning urine Ext:   Denies Joint pain, stiffness or swelling Skin: Denies  skin rash, easy  bruising or bleeding or hives Endoc:  Denies polyuria, polydipsia , polyphagia or weight change Psych:   Denies depression, insomnia or hallucinations   Other:  All other systems negative   VS: BP (!) 149/48 (BP Location: Right Leg)   Pulse (!) 59   Temp (!) 97.5 F (36.4 C) (Oral)   Resp 17   Ht $R'5\' 2"'ZV$  (1.575 m)   Wt 80.4 kg   SpO2 93%   BMI 32.41 kg/m      PHYSICAL EXAM    GENERAL:NAD, no fevers, chills, no weakness no fatigue HEAD: Normocephalic, atraumatic.  EYES: Pupils equal, round, reactive to light. Extraocular muscles intact. No scleral icterus.  MOUTH: Moist mucosal membrane. Dentition intact. No abscess noted.  EAR, NOSE, THROAT: Clear without exudates. No external lesions.  NECK: Supple. No thyromegaly. No nodules. No JVD.  PULMONARY: decreased breath sounds on left  CARDIOVASCULAR: S1 and S2. Regular rate and rhythm. No murmurs, rubs, or gallops. No edema. Pedal pulses 2+ bilaterally.  GASTROINTESTINAL: Soft, nontender, nondistended. No masses. Positive bowel sounds. No hepatosplenomegaly.  MUSCULOSKELETAL: No swelling, clubbing, or edema. Range of motion full in all extremities.  NEUROLOGIC: Cranial nerves II through XII are intact. No gross focal neurological deficits. Sensation intact. Reflexes intact.  SKIN: No ulceration, lesions, rashes, or cyanosis. Skin warm and dry. Turgor intact.  PSYCHIATRIC: Mood, affect within normal limits. The patient is awake, alert and oriented x 3. Insight, judgment intact.       IMAGING    CT HEAD WO CONTRAST  Result Date: 05/24/2020 CLINICAL DATA:  Mental status change EXAM: CT HEAD  WITHOUT CONTRAST TECHNIQUE: Contiguous axial images were obtained from the base of the skull through the vertex without intravenous contrast. COMPARISON:  05/22/2020 FINDINGS: Brain: There is no acute intracranial hemorrhage, mass effect, or edema. No new loss of gray-white differentiation. Small chronic left cerebellar infarct. Patchy hypoattenuation in the supratentorial white matter likely reflects stable chronic microvascular ischemic changes. There is no extra-axial fluid collection. Ventricles and sulci are stable in size and configuration. Vascular: There is atherosclerotic calcification at the skull base. Skull: Calvarium is unremarkable. Sinuses/Orbits: Mild mucosal thickening. No acute orbital abnormality. Other: None. IMPRESSION: No acute intracranial abnormality. No significant change since recent prior study. Electronically Signed   By: Macy Mis M.D.   On: 05/24/2020 17:14   CT CHEST WO CONTRAST  Result Date: 05/31/2020 CLINICAL DATA:  Respiratory failure, post thoracentesis, pneumonia, RIGHT shoulder pain EXAM: CT CHEST WITHOUT CONTRAST TECHNIQUE: Multidetector CT imaging of the chest was performed following the standard protocol without IV contrast. Sagittal and coronal MPR images reconstructed from axial data set. COMPARISON:  05/27/2020 FINDINGS: Cardiovascular: Atherosclerotic calcifications aorta, proximal great vessels and coronary arteries. Enlargement of cardiac chambers. Mitral annular calcification. Aorta normal caliber. Minimal pericardial effusion. Mediastinum/Nodes: Large hiatal hernia. Esophagus unremarkable. Base of cervical region normal appearance. Normal sized mediastinal lymph nodes. No thoracic adenopathy. Lungs/Pleura: BILATERAL small pleural effusions. Atelectasis of LEFT lower lobe with compressive atelectasis in BILATERAL upper lobes LEFT greater than RIGHT and in RIGHT lower lobe. No definite pulmonary mass identified though unable to exclude underlying abnormalities  within consolidated/atelectatic LEFT lung. No pneumothorax. Upper Abdomen: Multiple cysts and small calcified nodule at upper pole of LEFT kidney. Musculoskeletal: Chronic displaced fracture at surgical neck RIGHT humerus. Osseous demineralization. IMPRESSION: BILATERAL small pleural effusions with compressive atelectasis of the lungs greatest in LEFT lower lobe. No definite pulmonary mass identified though unable to exclude underlying abnormalities within consolidated/atelectatic LEFT lung. Large  hiatal hernia. Extensive atherosclerotic calcifications including coronary arteries. Chronic displaced fracture of the proximal RIGHT humerus. Aortic Atherosclerosis (ICD10-I70.0). Electronically Signed   By: Lavonia Dana M.D.   On: 05/31/2020 15:36   CT CHEST WO CONTRAST  Result Date: 05/27/2020 CLINICAL DATA:  Pneumonia.  Abnormal chest radiograph EXAM: CT CHEST WITHOUT CONTRAST TECHNIQUE: Multidetector CT imaging of the chest was performed following the standard protocol without IV contrast. COMPARISON:  Chest CT 05/18/2020, radiograph 05/27/2020 FINDINGS: Cardiovascular: Valvular calcification. Coronary artery calcification and aortic atherosclerotic calcification. Mediastinum/Nodes: 11 mm RIGHT lower paratracheal lymph node. Lungs/Pleura: Large volume LEFT pleural effusion occupying the near entirety of the LEFT hemithorax. There is passive atelectasis with complete atelectasis of the LEFT lower lobe and lingula. Small volume of LEFT upper lobe is aerated. Small moderate RIGHT pleural effusion with mild passive atelectasis. Upper Abdomen: Limited view of the liver, kidneys, pancreas are unremarkable. Normal adrenal glands. Musculoskeletal: No aggressive osseous lesion. Degenerative osteophytosis of the spine. IMPRESSION: 1. Progressive large LEFT pleural effusion now occupies the near entirety of the LEFT hemithorax. Complete passive atelectasis of the LEFT lower lobe and lingula. Minimal aeration of the LEFT upper  lobe. 2. Small to moderate RIGHT pleural effusion. 3. Coronary artery calcification and Aortic Atherosclerosis (ICD10-I70.0). Electronically Signed   By: Suzy Bouchard M.D.   On: 05/27/2020 18:06   [place] CHEST (PLEURAL EFFUSION)  Result Date: 05/30/2020 CLINICAL DATA:  87 year old female with cough and shortness of breath, recent left thoracentesis. EXAM: CHEST ULTRASOUND COMPARISON:  05/28/2020, 05/29/2020 FINDINGS: Trace bilateral pleural effusions, right greater than left. IMPRESSION: Trace bilateral pleural effusions, right greater than left. No thoracentesis performed. Left-sided opacities on chest radiograph most likely indicative of atelectasis and or infiltrative airspace process. Ruthann Cancer, MD [facility] Electronically Signed   By: Ruthann Cancer MD   On: 05/30/2020 16:26   DG Chest Port 1 View  Result Date: 05/29/2020 CLINICAL DATA:  87 year old female with cough and progressive shortness of breath. Hypoxia on room air. Recent left side thoracentesis. EXAM: PORTABLE CHEST 1 VIEW COMPARISON:  Portable chest 05/28/2020 and earlier. FINDINGS: Portable AP semi upright view at 1021 hours. Improved lung volumes from yesterday. The patient is less rotated now. Stable cardiac size and mediastinal contours. Left lung ventilation remains improved compared to the CT on 05/27/2020, with residual lower lung opacity which is probably a combination of residual effusion and airspace disease. Right lung appears stable. No pneumothorax. Visualized tracheal air column is within normal limits. Stable visualized osseous structures. Un healed proximal right humerus fracture. Paucity of bowel gas in the upper abdomen. IMPRESSION: 1. Left lung ventilation remains improved since the CT on 05/27/2020 with residual lower lung combined pleural effusion and airspace disease suspected. 2. No new cardiopulmonary abnormality. Electronically Signed   By: Genevie Ann M.D.    On: 05/29/2020 10:55   DG Chest Port 1 View  Result Date: 05/28/2020 CLINICAL DATA:  Status post left-sided thoracentesis EXAM: PORTABLE CHEST 1 VIEW COMPARISON:  CT chest and chest x-ray obtained yesterday FINDINGS: No evidence of pneumothorax following left-sided thoracentesis. Slightly improved aeration of the left upper lung. Persistent dense opacification of the left mid and lower chest which may represent persistent pleural fluid versus persistent atelectasis. The right lung remains clear. Chronic right proximal humeral fracture again noted. No acute osseous abnormality. Atherosclerotic calcifications present in the transverse aorta. IMPRESSION: 1. No evidence of pneumothorax following left-sided thoracentesis. 2. Improved aeration of the left upper lung but  persistent opacification of the left mid and lower lung. Findings may represent residual atelectasis versus residual pleural fluid. Electronically Signed   By: Malachy Moan M.D.   On: 05/28/2020 11:24   DG Chest Port 1 View  Result Date: 05/27/2020 CLINICAL DATA:  Pneumonia. EXAM: PORTABLE CHEST 1 VIEW COMPARISON:  05/24/2020; 05/22/2020 FINDINGS: Grossly unchanged cardiac silhouette and mediastinal contours with persistent obscuration of the right heart border secondary to unchanged left-sided pleural effusion and associated left mid lower lung consolidative opacities, similar to the 05/22/2020 examination. Atherosclerotic plaque within the aortic arch and mitral valve calcifications. Mild pulmonary venous congestion within the otherwise well aerated right lung. No definite right-sided pleural effusion though the right costophrenic angle is excluded from view. No acute osseous abnormalities. IMPRESSION: 1. Similar appearing extensive left lung consolidative opacities and small left-sided effusion worrisome for multifocal pneumonia. 2. Suspected pulmonary venous congestion within the otherwise well aerated right lung. Electronically Signed   By:  Simonne Come M.D.   On: 05/27/2020 09:30   DG Chest Port 1 View  Result Date: 05/24/2020 CLINICAL DATA:  Post left-sided thoracentesis. EXAM: PORTABLE CHEST 1 VIEW COMPARISON:  Chest radiograph-05/22/2020; 05/18/2020; chest CT-05/18/2020 FINDINGS: Grossly unchanged cardiac silhouette and mediastinal contours with atherosclerotic plaque within thoracic aorta. Dystrophic calcifications within the mitral valve annulus. Interval reduction/near resolution of left-sided pleural effusion post thoracentesis with improved aeration of the left lung apex. Redemonstrated extensive left mid and lower lung heterogeneous/consolidative opacities with associated left-sided volume loss and deviation of the cardiomediastinal structures to the left. Pulmonary vasculature within the right lung remains indistinct. No definite right-sided pleural effusion. No acute osseous abnormalities. Redemonstrated chronic displaced fracture involving the anatomic neck of the right humerus, incompletely evaluated. IMPRESSION: 1. Interval reduction/near resolution of left-sided pleural effusion post thoracentesis. No pneumothorax. 2. Otherwise, similar findings of extensive left mid and lower lung heterogeneous/consolidative opacities with associated volume loss. Electronically Signed   By: Simonne Come M.D.   On: 05/24/2020 12:15   DG Chest Port 1 View  Result Date: 05/22/2020 CLINICAL DATA:  Hypoxia EXAM: PORTABLE CHEST 1 VIEW COMPARISON:  05/18/2020 FINDINGS: Large left pleural effusion with worsening left upper consolidation. Chronic right humeral head fracture. Mild right chest opacity. IMPRESSION: Large left pleural effusion with worsening left upper consolidation. Electronically Signed   By: Deatra Robinson M.D.   On: 05/22/2020 01:44   DG Chest Port 1 View  Result Date: 05/18/2020 CLINICAL DATA:  Shortness of breath and fever EXAM: PORTABLE CHEST 1 VIEW COMPARISON:  March 06, 2020 FINDINGS: There is extensive airspace opacity  throughout most of the left lung. The right lung is clear. There is cardiomegaly with mild pulmonary venous hypertension. No adenopathy. There is aortic atherosclerosis. There is calcification in each carotid artery. No bone lesions. IMPRESSION: Widespread airspace opacity throughout much of the left lung consistent with multifocal pneumonia. Right lung clear. There is cardiomegaly with a degree of pulmonary vascular congestion. There is calcification in each carotid artery. Aortic Atherosclerosis (ICD10-I70.0). Electronically Signed   By: Bretta Bang III M.D.   On: 05/18/2020 09:08   CT HEAD CODE STROKE WO CONTRAST`  Result Date: 05/22/2020 CLINICAL DATA:  Code stroke.  Acute neurologic deficit EXAM: CT HEAD WITHOUT CONTRAST TECHNIQUE: Contiguous axial images were obtained from the base of the skull through the vertex without intravenous contrast. COMPARISON:  None. FINDINGS: Brain: There is no mass, hemorrhage or extra-axial collection. The size and configuration of the ventricles and extra-axial CSF spaces are normal. There is  hypoattenuation of the periventricular white matter, most commonly indicating chronic ischemic microangiopathy. Vascular: No abnormal hyperdensity of the major intracranial arteries or dural venous sinuses. No intracranial atherosclerosis. Skull: The visualized skull base, calvarium and extracranial soft tissues are normal. Sinuses/Orbits: No fluid levels or advanced mucosal thickening of the visualized paranasal sinuses. No mastoid or middle ear effusion. The orbits are normal. ASPECTS [facility] Stroke Program Early CT Score) - Ganglionic level infarction (caudate, lentiform nuclei, internal capsule, insula, M1-M3 cortex): 7 - Supraganglionic infarction (M4-M6 cortex): 3 Total score (0-10 with 10 being normal): 10 IMPRESSION: 1. Chronic ischemic microangiopathy without acute intracranial abnormality. 2. ASPECTS is 10. These results were called by telephone at the time of  interpretation on 05/22/2020 at 12:35 am to provider [facility] , who verbally acknowledged these results. Electronically Signed   By: Ulyses Jarred M.D.   On: 05/22/2020 00:44   ECHOCARDIOGRAM LIMITED  Result Date: 05/23/2020    ECHOCARDIOGRAM LIMITED REPORT   Patient Name:   PERLA ECHAVARRIA Date of Exam: 05/23/2020 Medical Rec #:  [id number]          Height:       62.0 in Accession #:    [id number]         Weight:       181.8 lb Date of Birth:  1927-09-25          BSA:          1.836 m Patient Age:    39 years           BP:           133/36 mmHg Patient Gender: F                  HR:           56 bpm. Exam Location:  [facility] Procedure: Limited Echo, Color Doppler and Cardiac Doppler Indications:     Chest pain R07.9                  NSTEMI I21.4  History:         Patient has prior history of Echocardiogram examinations, most                  recent 02/07/2020. Risk Factors:Hypertension and Diabetes.  Sonographer:     Sherrie Sport RDCS (AE) Referring Phys:  [id number] Arvil Chaco Diagnosing Phys: Kathlyn Sacramento MD IMPRESSIONS  1. Left ventricular ejection fraction, by estimation, is 55 to 60%. The left ventricle has normal function. Left ventricular endocardial border not optimally defined to evaluate regional wall motion. Left ventricular diastolic function could not be evaluated.  2. Right ventricular systolic function is normal. The right ventricular size is normal. Tricuspid regurgitation signal is inadequate for assessing PA pressure.  3. Left atrial size was mildly dilated.  4. Moderate pleural effusion.  5. The mitral valve is abnormal. No evidence of mitral valve regurgitation. No evidence of mitral stenosis. Moderate mitral annular calcification.  6. The aortic valve is normal in structure. Aortic valve regurgitation is not visualized. Mild aortic valve stenosis. No gradients were measured.  7. limited and challenging study. FINDINGS  Left Ventricle: Left ventricular ejection fraction, by  estimation, is 55 to 60%. The left ventricle has normal function. Left ventricular endocardial border not optimally defined to evaluate regional wall motion. The left ventricular internal cavity size was normal in size. There is no left ventricular hypertrophy. Left ventricular diastolic function could not be evaluated. Right Ventricle:  The right ventricular size is normal. No increase in right ventricular wall thickness. Right ventricular systolic function is normal. Tricuspid regurgitation signal is inadequate for assessing PA pressure. Left Atrium: Left atrial size was mildly dilated. Right Atrium: Right atrial size was normal in size. Pericardium: There is no evidence of pericardial effusion. Mitral Valve: The mitral valve is abnormal. Moderate mitral annular calcification. No evidence of mitral valve stenosis. Tricuspid Valve: The tricuspid valve is normal in structure. Tricuspid valve regurgitation is not demonstrated. No evidence of tricuspid stenosis. Aortic Valve: The aortic valve is normal in structure. Aortic valve regurgitation is not visualized. Mild aortic stenosis is present. Pulmonic Valve: The pulmonic valve was normal in structure. Pulmonic valve regurgitation is not visualized. No evidence of pulmonic stenosis. Aorta: The aortic root is normal in size and structure. Venous: The inferior vena cava was not well visualized. IAS/Shunts: No atrial level shunt detected by color flow Doppler. Additional Comments: There is a moderate pleural effusion. LEFT VENTRICLE PLAX 2D LVIDd:         3.92 cm LVIDs:         2.74 cm LV PW:         1.54 cm LV IVS:        0.97 cm LVOT diam:     2.00 cm LVOT Area:     3.14 cm  LEFT ATRIUM         Index LA diam:    5.10 cm 2.78 cm/m                        PULMONIC VALVE AORTA                 PV Vmax:        1.12 m/s Ao Root diam: 2.40 cm PV Peak grad:   5.0 mmHg                       RVOT Peak grad: 12 mmHg   SHUNTS Systemic Diam: 2.00 cm Lorine Bears MD Electronically  signed by Lorine Bears MD Signature Date/Time: 05/23/2020/10:19:48 AM    Final    CT CHEST ABDOMEN PELVIS WO CONTRAST  Result Date: 05/18/2020 CLINICAL DATA:  Chest pain or shortness of breath. Pleural effusion or pleurisy suspected EXAM: CT CHEST, ABDOMEN AND PELVIS WITHOUT CONTRAST TECHNIQUE: Multidetector CT imaging of the chest, abdomen and pelvis was performed following the standard protocol without IV contrast. COMPARISON:  01/13/2017 FINDINGS: CT CHEST FINDINGS Cardiovascular: Normal heart size. No pericardial effusion. Bulky atherosclerotic calcification of the aorta and great vessels. Coronary atherosclerosis. Mediastinum/Nodes: No adenopathy or mass. Moderate sliding hiatal hernia. Lungs/Pleura: Dense consolidation throughout the left lung with poor visualization of central airways. Small left pleural effusion. Musculoskeletal: Nonacute right humeral neck fracture with marked anterior displacement that is similar to initial diagnosis February 2022 by radiography. Extensive spondylosis and thoracic spine ankylosis. Multilevel degenerative facet spurring especially at the open T6-7 level where there is biforaminal impingement. CT ABDOMEN PELVIS FINDINGS Hepatobiliary: No focal liver abnormality.No evidence of biliary obstruction or stone. Pancreas: Unremarkable. Spleen: Unremarkable. Adrenals/Urinary Tract: Negative adrenals. No hydronephrosis or stone. Left upper pole renal lesion which is a cyst by ultrasound in 2020. Peripherally calcified structure in the left upper pole which was also described on prior. Unremarkable bladder. Stomach/Bowel: No obstruction. No visible bowel inflammation. Numerous distal colonic diverticula. Distal colonic stool with moderate distension of the rectum. No superimposed rectal wall thickening. Vascular/Lymphatic: No acute  vascular abnormality. No mass or adenopathy. Reproductive:Hysterectomy Other: No ascites or pneumoperitoneum. Musculoskeletal: No acute abnormalities.  Severe lumbar spine degeneration which is generalized. Generalized osteopenia. Hip osteoarthritis that is particularly advanced on the right. IMPRESSION: 1. Extensive left-sided pneumonia opacifying most of the left lung. Small left parapneumonic effusion. 2. No acute intra-abdominal finding. 3. Numerous chronic/known incidental findings which are described above. Electronically Signed   By: Monte Fantasia M.D.   On: 05/18/2020 11:21   US THORACENTESIS ASP PLEURAL SPACE W/IMG GUIDE  Result Date: 05/28/2020 INDICATION: Patient with history of multifocal pneumonia with left sided pleural effusion. Request is for therapeutic and diagnostic left-sided thoracentesis EXAM: ULTRASOUND GUIDED THERAPEUTIC AND DIAGNOSTIC THORACENTESIS MEDICATIONS: Lidocaine 1% 10 mL COMPLICATIONS: None immediate. PROCEDURE: An ultrasound guided thoracentesis was thoroughly discussed with the patient and questions answered. The benefits, risks, alternatives and complications were also discussed. The patient understands and wishes to proceed with the procedure. Written consent was obtained. Ultrasound was performed to localize and mark an adequate pocket of fluid in the left chest. The area was then prepped and draped in the normal sterile fashion. 1% Lidocaine was used for local anesthesia. Under ultrasound guidance a 6 Fr Safe-T-Centesis catheter was introduced. Thoracentesis was performed. The catheter was removed and a dressing applied. FINDINGS: A total of approximately 600 mL of straw-colored fluid was removed. Samples were sent to the laboratory as requested by the clinical team. IMPRESSION: Successful ultrasound guided therapeutic and diagnostic left-sided thoracentesis yielding 600 mL of pleural fluid. Read by: Rushie Nyhan, NP Electronically Signed   By: Corrie Mckusick D.O.   On: 05/28/2020 11:33   US THORACENTESIS ASP PLEURAL SPACE W/IMG GUIDE  Result Date: 05/24/2020 INDICATION: Symptomatic left sided pleural effusion.  Please perform ultrasound-guided thoracentesis for diagnostic and therapeutic purposes. EXAM: US THORACENTESIS ASP PLEURAL SPACE W/IMG GUIDE COMPARISON:  Chest radiograph-05/22/2020; 05/18/2020; chest CT-05/18/2020 MEDICATIONS: None. COMPLICATIONS: None immediate. TECHNIQUE: Informed written consent was obtained from the the patient and the patient's daughter after a discussion of the risks, benefits and alternatives to treatment. A timeout was performed prior to the initiation of the procedure. Initial ultrasound scanning demonstrates a small left-sided pleural effusion with dominant component loculated regional to the left upper lobe. The posterior aspect of the left upper/mid chest was prepped and draped in the usual sterile fashion. 1% lidocaine was used for local anesthesia. Under direct ultrasound guidance, an 8 Fr Safe-T-Centesis catheter was introduced. The thoracentesis was performed. The catheter was removed and a dressing was applied. The patient tolerated the procedure well without immediate post procedural complication. The patient was escorted to have an upright chest radiograph. FINDINGS: A total of approximately 250 cc of serous fluid was removed. Requested samples were sent to the laboratory. IMPRESSION: Successful ultrasound-guided left sided thoracentesis yielding 250 cc of pleural fluid. Electronically Signed   By: Sandi Mariscal M.D.   On: 05/24/2020 12:20      ASSESSMENT/PLAN   Acute on chronic hypoxemic respiratory failure -S/p thoracentesis with negative gm stain , neutrophil predominant exudate due to diuresis . -patient significantly clinically improved, looks better then she did in office 4 months ago. -She has CKD and CHF with cardiology following - appreciate input -she has reactive airway disease with asthma and Obesity hypoventilation overlap and does benefit from BIPAP nocturnally. -She is with severe deconditioning which is precluding her from taking vigorous breath and she  should have IS and Chest PT -reviewed CT chest no pneumonia no mass no pneumonthorax no fibrosis, +atelectasis LLL  and effusion left side -Reviewed care plan with daughter and patient at bedside -Met with Attending physician and reviewed imaging and plan together.  -repeat thoracentesis - patient feels better - 4/5 - reviewed cxr - improved  -4/6 - repeat thoracentesis - not enough fluid, cancelled  - 4/7 repeat CT chest of residual PNA/atelecatsis at Left base -4/8- there are pleural effusions bilaterally, there is a large amount of atelectasis worse on left.  This will be recurited further with MetaNEB device, I discussed importance of technique with patient. She is tired this am did not sleep well.  We will also diurese more aggressively since IR was unable to perform  repeat thoracentesis.  I have dcd norvasc to potentiate BP and ordered lasix 80iv to diurese better.  She is using IS and working with pt/ot.         Thank you for allowing me to participate in the care of this patient.    Patient/Family are satisfied with care plan and all questions have been answered.  This document was prepared using Dragon voice recognition software and may include unintentional dictation errors.     Ottie Glazier, M.D.  [facility] [place]

## 2020-06-01 NOTE — Progress Notes (Signed)
OT Cancellation Note  Patient Details Name: FE OKUBO MRN: [id number] DOB: 12-04-1927   Cancelled Treatment:    Reason Eval/Treat Not Completed: Patient declined, no reason specified. Pt sleeping, difficult to awaken, requests therapist return at a later time/date.  Josiah Lobo, PhD, MS, OTR/L 06/01/20, 3:50 PM

## 2020-06-01 NOTE — Progress Notes (Signed)
PT Cancellation Note  Patient Details Name: Amy [name] MRN: [id number] DOB: 1927-04-25   Cancelled Treatment:     RN tech at bedside assisting pt with bathing. Will return at a more appropriate time/date.   Willette Pa 06/01/2020, 10:45 AM

## 2020-06-01 NOTE — TOC Progression Note (Signed)
Transition of Care [facility]) - Progression Note    Patient Details  Name: Amy [name] MRN: [id number] Date of Birth: [birth year]  Transition [facility]) CM/SW Contact  Anselm Pancoast, RN Phone Number: 06/01/2020, 11:14 AM  Clinical Narrative:    Spoke with daughter who is talking with [facility] on possible placement. Daughter has started Medicaid process and is needing to scan in documents. Daughter will bring in documents and TOC can assist with scanning. Possible need for LTC if not appropriate for ALF due to mobility and assistance needed.         Expected Discharge Plan and Services                                                 Social Determinants of Health (SDOH) Interventions    Readmission Risk Interventions Readmission Risk Prevention Plan 08/23/2019 08/15/2019  Transportation Screening Complete Complete  Medication Review [employer]) Complete Complete  PCP or Specialist appointment within 3-5 days of discharge Complete Complete  HRI or Home Care Consult Complete Complete  SW Recovery Care/Counseling Consult Complete Complete  Palliative Care Screening Not Applicable Not [place] Complete Not Applicable  Some recent data might be hidden

## 2020-06-01 NOTE — TOC Progression Note (Signed)
Transition of Care [facility]) - Progression Note    Patient Details  Name: Amy [name] MRN: [id number] Date of Birth: 1927-09-28  Transition of Care [facility]) CM/SW [place], RN Phone Number: 06/01/2020, 2:55 PM  Clinical Narrative:  Spoke with patients daughter after receiving ALF denial due to mother's condition exceeding requirement for ALF. Daughter voices understanding, discussed [place], daughter wanted information to choose and research herself due to negative feedback receive from speaking with friends who used [facility] services.   List taken to patients' son, who says he will make sure his sister get the copy to review and research.           Expected Discharge Plan and Services                                                 Social Determinants of Health (SDOH) Interventions    Readmission Risk Interventions Readmission Risk Prevention Plan 08/23/2019 08/15/2019  Transportation Screening Complete Complete  Medication Review Press photographer) Complete Complete  PCP or Specialist appointment within 3-5 days of discharge Complete Complete  HRI or Home Care Consult Complete Complete  SW Recovery Care/Counseling Consult Complete Complete  Palliative Care Screening Not Applicable Not [place] Complete Not Applicable  Some recent data might be hidden

## 2020-06-01 NOTE — Progress Notes (Signed)
PROGRESS NOTE    Amy [name]  ZJI:[id number] DOB: [birth year] DOA: 05/18/2020 PCP: Alvester Morin, MD     Brief Narrative:  Patient was admitted on 05/18/2020 with shortness of breath and abdominal pain. Past medical history of hypertension, hyperlipidemia, type 2 diabetes, COPD, CAD, hypothyroidism, atrial fibrillation, seizure, chronic kidney disease, anemia, CHF. She was found to have clinical sepsis with multifocal pneumonia and left pleural effusion. The patient has received 10 days of antibiotics. She had a thoracentesis on 05/24/2020 removing 250 mL of fluid. Another thoracentesis on 05/28/2020 removing 600 mL of fluid. Nothing growing on a either culture. Follow-up cytology of the second thoracentesis. Repeat chest x-ray little bit better than previous x-rays. Pulmonary does not want to do another thoracentesis at this point. Patient was also found to have NSTEMI. Patient also had acute metabolic encephalopathy and was barely responsive 5 days ago. Patient also has bilateral feet ankle and leg pain and her Lyrica was restarted and slowly improving.  Hospitalization further complicated by difficult disposition planning.  New events last 24 hours / Subjective: Patient sitting in chair, states she has pain in her RUE due to previous fracture. Denies worsening SOB, states breathing seems to be better. Complains of pain in her right foot.   After my evaluation, I spoke with daughter who was concerned regarding left sided facial swelling. Patient was re-evaluated and I did notice swelling on left face, more than right, although unclear how long this has been present.   Assessment & Plan:   Principal Problem:   Multifocal pneumonia Active Problems:   Acquired hypothyroidism   HLD (hyperlipidemia)   Essential hypertension   Iron deficiency anemia   Chronic diastolic CHF (congestive heart failure) (HCC)   Anemia of chronic disease   COPD with chronic bronchitis (HCC)    Severe sepsis (HCC)   CKD (chronic kidney disease) stage 4, GFR 15-29 ml/min (HCC)   HTN (hypertension)   GERD (gastroesophageal reflux disease)   CAD (coronary artery disease)   NSTEMI (non-ST elevated myocardial infarction) (HCC)   AF (paroxysmal atrial fibrillation) (HCC)   Seizure (HCC)   Elevated troponin   Abdominal pain   Diarrhea   Acute respiratory failure with hypoxia (HCC)   Recurrent left pleural effusion   Acute metabolic encephalopathy   Pain    Multifocal pneumonia Recurrent pleural effusion Severe sepsis secondary to above Patient with multiple thoracentesis during admission Thora 3/31, 250 cc fluid Thora 4/4, 600 cc fluid Follow-up chest x-ray shows improved air entry but persistent effusion Pulmonary ordered repeat Thora however upon ultrasound there was insufficient fluid so no tap was performed Patient has been weaned to room air as of 4/7 CT chest revealed bilateral small pleural effusion with compressive left sided atelectasis  Patient has completed 10-day course of antibiotics Pulmonology following, ordered Lasix and albumin for continued diuresis.  Recommending IS  Gout  Prednisone started  ?Left facial droop Some swelling of left lower face, very mild asymmetric smile  Check head CT   Acute metabolic encephalopathy Suspect multifactorial etiology in the setting of sepsis and infection Mental status appears to be at baseline CT scan head negative x2  Delirium precautions Frequent neuro checks  NSTEMI Medical management per cardiology Was on heparin drip 2 days beginning of hospital course Troponin peak 1196 Continue Plavix continue statin  Acute hypoxemic respiratory failure, resolved Low pulse ox 85% room air Weaned to room air as of 4/7  Acute kidney injury on chronic kidney disease stage IIIb Peak  creatinine 1.84 Creatinine improving  Essential hypertension Norvasc discontinued.  Ordered Lasix today  PAF Rate  controlled No anticoagulation due to history of GI bleed  Seizure disorder Continue Keppra  Iron deficiency anemia P.o. iron supplementation  Weakness Functional decline Continue PT OT, TOC assisting with disposition    DVT prophylaxis:  enoxaparin (LOVENOX) injection 30 mg Start: 05/25/20 2200 Place and maintain sequential compression device Start: 05/21/20 1353  Code Status: DNR Family Communication: Spoke with daughter over the phone x2  Disposition Plan:  Status is: Inpatient  Remains inpatient appropriate because:Ongoing diagnostic testing needed not appropriate for outpatient work up, Unsafe d/c plan and IV treatments appropriate due to intensity of illness or inability to take PO   Dispo: The patient is from: SNF              Anticipated d/c is to: SNF              Patient currently is not medically stable to d/c. Continue treatment as outlined above. Check head CT. Unclear disposition, patient is out of SNF days, daughter is trying to find out what the out of pocket cost will be    Difficult to place patient Yes   Antimicrobials:  Anti-infectives (From admission, onward)   Start     Dose/Rate Route Frequency Ordered Stop   05/25/20 1000  piperacillin-tazobactam (ZOSYN) IVPB 3.375 g        3.375 g 12.5 mL/hr over 240 Minutes Intravenous Every 8 hours 05/25/20 0849 05/28/20 0154   05/24/20 2200  piperacillin-tazobactam (ZOSYN) IVPB 3.375 g  Status:  Discontinued        3.375 g 12.5 mL/hr over 240 Minutes Intravenous Every 12 hours 05/24/20 1433 05/25/20 0849   05/24/20 0300  vancomycin (VANCOREADY) IVPB 750 mg/150 mL  Status:  Discontinued        750 mg 150 mL/hr over 60 Minutes Intravenous Every 48 hours 05/22/20 0219 05/23/20 0956   05/22/20 1400  piperacillin-tazobactam (ZOSYN) IVPB 3.375 g  Status:  Discontinued        3.375 g 12.5 mL/hr over 240 Minutes Intravenous Every 8 hours 05/22/20 1055 05/24/20 1433   05/22/20 0300  vancomycin (VANCOREADY) IVPB  2000 mg/400 mL        2,000 mg 200 mL/hr over 120 Minutes Intravenous  Once 05/22/20 0212 05/22/20 0426   05/20/20 0800  vancomycin (VANCOREADY) IVPB 750 mg/150 mL  Status:  Discontinued        750 mg 150 mL/hr over 60 Minutes Intravenous Every 48 hours 05/18/20 1309 05/19/20 0805   05/19/20 1000  ceFEPIme (MAXIPIME) 2 g in sodium chloride 0.9 % 100 mL IVPB  Status:  Discontinued        2 g 200 mL/hr over 30 Minutes Intravenous Every 24 hours 05/18/20 1253 05/22/20 1055   05/18/20 1000  vancomycin (VANCOREADY) IVPB 750 mg/150 mL        750 mg 150 mL/hr over 60 Minutes Intravenous  Once 05/18/20 0949 05/18/20 1405   05/18/20 0900  ceFEPIme (MAXIPIME) 2 g in sodium chloride 0.9 % 100 mL IVPB        2 g 200 mL/hr over 30 Minutes Intravenous  Once 05/18/20 0845 05/18/20 1141   05/18/20 0900  metroNIDAZOLE (FLAGYL) IVPB 500 mg        500 mg 100 mL/hr over 60 Minutes Intravenous  Once 05/18/20 0845 05/18/20 1450   05/18/20 0900  vancomycin (VANCOCIN) IVPB 1000 mg/200 mL premix  1,000 mg 200 mL/hr over 60 Minutes Intravenous  Once 05/18/20 0845 05/18/20 1242        Objective: Vitals:   06/01/20 0459 06/01/20 0715 06/01/20 0828 06/01/20 1203  BP: (!) 149/48  125/64 (!) 144/56  Pulse: (!) 59  63 72  Resp: 17  16 18   Temp: (!) 97.5 F (36.4 C)  97.6 F (36.4 C) (!) 97.4 F (36.3 C)  TempSrc: Oral  Oral Oral  SpO2: 96% 93% 97% 93%  Weight: 80.4 kg     Height:        Intake/Output Summary (Last 24 hours) at 06/01/2020 1231 Last data filed at 06/01/2020 1007 Gross per 24 hour  Intake 724 ml  Output 400 ml  Net 324 ml   Filed Weights   05/27/20 0500 05/28/20 0424 06/01/20 0459  Weight: 82.9 kg 81.3 kg 80.4 kg    Examination:  General exam: Appears calm and comfortable  Respiratory system: Clear to auscultation. Respiratory effort normal. No respiratory distress. No conversational dyspnea. On room air  Cardiovascular system: S1 & S2 heard, RRR. No murmurs. No pedal  edema. Gastrointestinal system: Abdomen is nondistended, soft and nontender. Normal bowel sounds heard. Central nervous system: Alert and oriented. +mild asymmetric smile with left droop, no other CN deficits, RUE exam limited due to pain from previous fracture  Skin: +right MTP erythema  Psychiatry: Judgement and insight appear normal. Mood & affect appropriate.   Data Reviewed: I have personally reviewed following labs and imaging studies  CBC: Recent Labs  Lab 05/26/20 0432 05/27/20 0543 05/28/20 0646 05/30/20 0839 06/01/20 0819  WBC 13.9* 12.3* 11.0* 12.1* 7.9  NEUTROABS  --   --   --  8.4*  --   HGB 9.0* 9.6* 9.9* 13.2 10.4*  HCT 28.8* 30.3* 31.6* 43.1 33.0*  MCV 95.0 93.8 94.3 95.8 94.0  PLT 219 235 263 350 914   Basic Metabolic Panel: Recent Labs  Lab 05/26/20 0432 05/27/20 0543 05/28/20 0646 05/29/20 1010 05/30/20 0742 05/31/20 1009 06/01/20 0819  NA 143 144 145 142 142 142 145  K 4.2 4.2 4.3 5.1 4.9 4.6 4.5  CL 113* 113* 114* 113* 113* 110 112*  CO2 26 24 26  20* 24 24 26   GLUCOSE 126* 138* 119* 118* 107* 148* 99  BUN 41* 35* 37* 41* 42* 42* 45*  CREATININE 1.49* 1.39* 1.30* 1.29* 1.20* 1.21* 1.07*  CALCIUM 8.2* 8.2* 8.2* 8.3* 8.1* 7.9* 8.1*  MG 1.8 1.8 1.9  --   --   --   --    GFR: Estimated Creatinine Clearance: 33.6 mL/min (A) (by C-G formula based on SCr of 1.07 mg/dL (H)). Liver Function Tests: No results for input(s): AST, ALT, ALKPHOS, BILITOT, PROT, ALBUMIN in the last 168 hours. No results for input(s): LIPASE, AMYLASE in the last 168 hours. No results for input(s): AMMONIA in the last 168 hours. Coagulation Profile: No results for input(s): INR, PROTIME in the last 168 hours. Cardiac Enzymes: No results for input(s): CKTOTAL, CKMB, CKMBINDEX, TROPONINI in the last 168 hours. BNP (last 3 results) No results for input(s): PROBNP in the last 8760 hours. HbA1C: No results for input(s): HGBA1C in the last 72 hours. CBG: No results for input(s):  GLUCAP in the last 168 hours. Lipid Profile: No results for input(s): CHOL, HDL, LDLCALC, TRIG, CHOLHDL, LDLDIRECT in the last 72 hours. Thyroid Function Tests: No results for input(s): TSH, T4TOTAL, FREET4, T3FREE, THYROIDAB in the last 72 hours. Anemia Panel: No results for input(s): VITAMINB12,  FOLATE, FERRITIN, TIBC, IRON, RETICCTPCT in the last 72 hours. Sepsis Labs: No results for input(s): PROCALCITON, LATICACIDVEN in the last 168 hours.  Recent Results (from the past 240 hour(s))  MRSA PCR Screening     Status: None   Collection Time: 05/23/20  8:16 AM   Specimen: Nasal Mucosa; Nasopharyngeal  Result Value Ref Range Status   MRSA by PCR NEGATIVE NEGATIVE Final    Comment:        The GeneXpert MRSA Assay (FDA approved for NASAL specimens only), is one component of a comprehensive MRSA colonization surveillance program. It is not intended to diagnose MRSA infection nor to guide or monitor treatment for MRSA infections. Performed at [facility], [address] [place], [place] [address]   Body fluid culture w Gram Stain     Status: None   Collection Time: 05/24/20 11:58 AM   Specimen: PATH Cytology Pleural fluid  Result Value Ref Range Status   Specimen Description   Final    PLEURAL Performed at [facility], [address] [place], [place] [address]    Special Requests   Final    NONE Performed at [facility], [address] [place], [place] [address]    Gram Stain   Final    FEW WBC PRESENT, PREDOMINANTLY MONONUCLEAR NO ORGANISMS SEEN    Culture   Final    NO GROWTH 3 DAYS Performed at [facility], [place] [address] [place], [place] [address]    Report Status 05/27/2020 FINAL  Final  Body fluid culture w Gram Stain     Status: None   Collection Time: 05/28/20 11:00 AM   Specimen: PATH Cytology Pleural fluid  Result Value Ref Range Status   Specimen Description   Final    PLEURAL Performed at [facility],  [address] [place], [place] [address]    Special Requests   Final    NONE Performed at [facility], [address] [place], [place] [address]    Gram Stain   Final    RARE WBC PRESENT, PREDOMINANTLY MONONUCLEAR NO ORGANISMS SEEN    Culture   Final    NO GROWTH 3 DAYS Performed at [facility], [place] [address] [place], [place] [address]    Report Status 06/01/2020 FINAL  Final      Radiology Studies: CT CHEST WO CONTRAST  Result Date: 05/31/2020 CLINICAL DATA:  Respiratory failure, post thoracentesis, pneumonia, RIGHT shoulder pain EXAM: CT CHEST WITHOUT CONTRAST TECHNIQUE: Multidetector CT imaging of the chest was performed following the standard protocol without IV contrast. Sagittal and coronal MPR images reconstructed from axial data set. COMPARISON:  05/27/2020 FINDINGS: Cardiovascular: Atherosclerotic calcifications aorta, proximal great vessels and coronary arteries. Enlargement of cardiac chambers. Mitral annular calcification. Aorta normal caliber. Minimal pericardial effusion. Mediastinum/Nodes: Large hiatal hernia. Esophagus unremarkable. Base of cervical region normal appearance. Normal sized mediastinal lymph nodes. No thoracic adenopathy. Lungs/Pleura: BILATERAL small pleural effusions. Atelectasis of LEFT lower lobe with compressive atelectasis in BILATERAL upper lobes LEFT greater than RIGHT and in RIGHT lower lobe. No definite pulmonary mass identified though unable to exclude underlying abnormalities within consolidated/atelectatic LEFT lung. No pneumothorax. Upper Abdomen: Multiple cysts and small calcified nodule at upper pole of LEFT kidney. Musculoskeletal: Chronic displaced fracture at surgical neck RIGHT humerus. Osseous demineralization. IMPRESSION: BILATERAL small pleural effusions with compressive atelectasis of the lungs greatest in LEFT lower lobe. No definite pulmonary mass identified though unable to exclude underlying abnormalities  within  consolidated/atelectatic LEFT lung. Large hiatal hernia. Extensive atherosclerotic calcifications including coronary arteries. Chronic displaced fracture of the proximal RIGHT humerus. Aortic Atherosclerosis (ICD10-I70.0). Electronically Signed   By: Lavonia Dana M.D.   On: 05/31/2020 15:36   [place] CHEST (PLEURAL EFFUSION)  Result Date: 05/30/2020 CLINICAL DATA:  87 year old female with cough and shortness of breath, recent left thoracentesis. EXAM: CHEST ULTRASOUND COMPARISON:  05/28/2020, 05/29/2020 FINDINGS: Trace bilateral pleural effusions, right greater than left. IMPRESSION: Trace bilateral pleural effusions, right greater than left. No thoracentesis performed. Left-sided opacities on chest radiograph most likely indicative of atelectasis and or infiltrative airspace process. Ruthann Cancer, MD [facility] Radiology Electronically Signed   By: Ruthann Cancer MD   On: 05/30/2020 16:26      Scheduled Meds: . atorvastatin  20 mg Oral Daily  . bisacodyl  10 mg Rectal QHS  . Chlorhexidine Gluconate Cloth  6 each Topical Daily  . clopidogrel  75 mg Oral Daily  . dextromethorphan-guaiFENesin  1 tablet Oral BID  . doxazosin  2 mg Oral Q12H  . enoxaparin (LOVENOX) injection  30 mg Subcutaneous Q24H  . furosemide  80 mg Intravenous Daily  . ipratropium-albuterol  3 mL Nebulization BID  . iron polysaccharides  150 mg Oral Daily  . levETIRAcetam  250 mg Oral BID  . levothyroxine  100 mcg Oral Q0600  . lidocaine  1 patch Transdermal Q24H  . magnesium oxide  400 mg Oral Daily  . mometasone-formoterol  2 puff Inhalation BID  . pantoprazole  20 mg Oral Daily  . pregabalin  50 mg Oral q AM   And  . pregabalin  100 mg Oral QHS  . sodium chloride flush  10-40 mL Intracatheter Q12H  . sodium phosphate  1 enema Rectal Daily   Continuous Infusions:   LOS: 14 days      Time spent: 50 minutes   Dessa Phi, DO [facility] 06/01/2020, 12:31 PM    Available via Epic secure chat 7am-7pm After these hours, please refer to coverage provider listed on [email or web address]

## 2020-06-01 NOTE — Progress Notes (Signed)
Pt had complaints of increased abdominal pain upon palpation during assessment and continued right arm pain d/t old fx; however pt too lethargic to receive advanced PRN pain medication. Tylenol given for pain. RT had to stop tx d/t pt complaints of pain. NP made aware of situation. PRN medications ordered. Pt currently resting in bed quietly with eyes closed.

## 2020-06-01 NOTE — Progress Notes (Signed)
metaneb started with this patient. Half way thru tx, patient started moaning of intense pain under right arm. She states she has had pain there b4 but not as bad. tx stopped. Informed charge Therapist, sports. Pt RN not available

## 2020-06-01 NOTE — Progress Notes (Signed)
PT Cancellation Note  Patient Details Name: Amy [name] MRN: [id number] DOB: [birth year]   Cancelled Treatment:     Pt was asleep upon entry. Requested holding PT due to fatigue. Will return at later time and date.     Willette Pa 06/01/2020, 3:42 PM

## 2020-06-01 NOTE — Plan of Care (Signed)
  Problem: Education: Goal: Knowledge of General Education information will improve Description: Including pain rating scale, medication(s)/side effects and non-pharmacologic comfort measures 06/01/2020 1059 by Cristela Blue, RN Outcome: Progressing 06/01/2020 1059 by Cristela Blue, RN Outcome: Progressing   Problem: Health Behavior/Discharge Planning: Goal: Ability to manage health-related needs will improve 06/01/2020 1059 by Cristela Blue, RN Outcome: Progressing 06/01/2020 1059 by Cristela Blue, RN Outcome: Progressing   Problem: Clinical Measurements: Goal: Ability to maintain clinical measurements within normal limits will improve 06/01/2020 1059 by Cristela Blue, RN Outcome: Progressing 06/01/2020 1059 by Cristela Blue, RN Outcome: Progressing Goal: Will remain free from infection 06/01/2020 1059 by Cristela Blue, RN Outcome: Progressing 06/01/2020 1059 by Cristela Blue, RN Outcome: Progressing Goal: Diagnostic test results will improve 06/01/2020 1059 by Cristela Blue, RN Outcome: Progressing 06/01/2020 1059 by Cristela Blue, RN Outcome: Progressing Goal: Respiratory complications will improve 06/01/2020 1059 by Cristela Blue, RN Outcome: Progressing 06/01/2020 1059 by Cristela Blue, RN Outcome: Progressing Goal: Cardiovascular complication will be avoided 06/01/2020 1059 by Cristela Blue, RN Outcome: Progressing 06/01/2020 1059 by Cristela Blue, RN Outcome: Progressing   Problem: Activity: Goal: Risk for activity intolerance will decrease 06/01/2020 1059 by Cristela Blue, RN Outcome: Progressing 06/01/2020 1059 by Cristela Blue, RN Outcome: Progressing   Problem: Nutrition: Goal: Adequate nutrition will be maintained 06/01/2020 1059 by Cristela Blue, RN Outcome: Progressing 06/01/2020 1059 by Cristela Blue, RN Outcome: Progressing   Problem: Coping: Goal: Level of anxiety will decrease 06/01/2020 1059 by Cristela Blue, RN Outcome: Progressing 06/01/2020 1059 by  Cristela Blue, RN Outcome: Progressing   Problem: Elimination: Goal: Will not experience complications related to bowel motility 06/01/2020 1059 by Cristela Blue, RN Outcome: Progressing 06/01/2020 1059 by Cristela Blue, RN Outcome: Progressing Goal: Will not experience complications related to urinary retention 06/01/2020 1059 by Cristela Blue, RN Outcome: Progressing 06/01/2020 1059 by Cristela Blue, RN Outcome: Progressing   Problem: Pain Managment: Goal: General experience of comfort will improve 06/01/2020 1059 by Cristela Blue, RN Outcome: Progressing 06/01/2020 1059 by Cristela Blue, RN Outcome: Progressing   Problem: Safety: Goal: Ability to remain free from injury will improve 06/01/2020 1059 by Cristela Blue, RN Outcome: Progressing 06/01/2020 1059 by Cristela Blue, RN Outcome: Progressing   Problem: Skin Integrity: Goal: Risk for impaired skin integrity will decrease 06/01/2020 1059 by Cristela Blue, RN Outcome: Progressing 06/01/2020 1059 by Cristela Blue, RN Outcome: Progressing

## 2020-06-01 NOTE — Consult Note (Signed)
Consultation Note Date: 06/01/2020   Patient Name: Amy [name]  DOB: [birth year]  MRN: [id number]  Age / Sex: 87 y.o., female  PCP: Alvester Morin, MD Referring Physician: Dessa Phi, DO  Reason for Consultation: Establishing goals of care  HPI/Patient Profile: 87 y.o. female  with past medical history of hypertension, hyperlipidemia, type 2 diabetes, COPD, CAD, hypothyroidism, atrial fibrillation, seizure, chronic kidney disease, anemia, and CHF admitted on 05/18/2020 with multifocal pneumonia and sepsis. Found to have NSTEMI. Difficult disposition planning. PMT consulted for Amy [name].  Patient has been seen many times by palliative medicine - goals of care are well documented. Has a MOST on file documenting DNR, limited additional interventions.  Clinical Assessment and Goals of Care: I have reviewed medical records including EPIC notes, labs and imaging, assessed the patient and then met with patient and spoke with daughter [name] via phone to discuss diagnosis prognosis, [place], EOL wishes, disposition and options.  They are familiar with palliative care services.   Patient tells me she has been unable to walk, dependent on others for ADLs. Tells me her appetite has been good. I attempted to elicit values and goals of care important to the patient. She tells me she is really unsure of her goals. She knows she would ultimately like to be home but understands that is not realistic right now. She is hopeful for facility placement. She tells me she does not want to return to hospital; however I do NOT feel that she was implying she was interested in comfort measures only.  I called daughter for follow up conversation. We review above conversation. Daughter agrees that patient would want to return to the hospital if needed - she just wants to stay well and stay out of the hospital.   Palliative medicine is involved outpatient. We reviewed  possibility of hospice involvement outpatient; however, patient's goals do not align with hospice. Daughter tells me hospice involvement was explored several months ago and this is not something they are interested in.   Questions and concerns were addressed. The family was encouraged to call with questions or concerns.   Primary Decision Maker PATIENT, daughter [name] if patient unable  SUMMARY OF RECOMMENDATIONS   - palliative following outpatient - not interested in hospice suport/goals do not align - Goals of care well documented in previous palliative visits and MOST form in chart - daughter needs additional support from [facility] for disposition - please call PMT for additional needs  Code Status/Advance Care Planning:  DNR  Discharge Planning: To Be Determined      Primary Diagnoses: Present on Admission: . Acquired hypothyroidism . CAD (coronary artery disease) . Chronic diastolic CHF (congestive heart failure) ([place]) . CKD (chronic kidney disease) stage 4, GFR 15-29 ml/min (HCC) . COPD with chronic bronchitis ([place]) . Essential hypertension . GERD (gastroesophageal reflux disease) . HLD (hyperlipidemia) . Iron deficiency anemia . Severe sepsis ([place]) . Elevated troponin . Abdominal pain . Diarrhea . HTN (hypertension)   I have reviewed the medical record, interviewed the patient and family, and examined the patient. The following aspects are pertinent.  Past Medical History:  Diagnosis Date  . Bladder incontinence   . CAD (coronary artery disease)    a. 05/2009 Cath: LAD 90p (Xience 2.75 x 12 mm DES), 88m D1 40, LCx 40p/m, RCA 30/40/30p/m, RCA 30/25d;  b.  12/2011 Lexiscan MV: no ischemia, breast attenuation artifact, normal EF-->Low risk; c. 10/2016 MV: fixed apical defect, most likely apical thinning and attenuation, No ischemia, EF  60%.  . Cancer ([place])    ovarian  . Carotid arterial disease w/ R Carotid Bruit (HCC)    a. 08/2016 Carotid U/S: 40-59% bilat ICA stenosis -  f/u 1 yr.  . Chronic diastolic (congestive) heart failure ([place])    a. Echo 09/2015: EF 55-60% w/ Grade 1 DD, sev Ca2+ MV annulus, mildly dil LA; b. 09/2016 Echo: EF 65-70%, Gr2 DD, mildly dil LA/RA, nl RV fxn.  . Chronic Dyspnea on exertion   . CKD (chronic kidney disease) stage 3, GFR 30-59 ml/min (HCC)   . Degenerative arthritis of knee    bilateral knees  . Diabetes mellitus    Type II  . GIB (gastrointestinal bleeding)    a. 08/2016 Admission w/ presyncope/anemia/melena-->req Transfusion-->endo ok, colonoscopy w/ polyps but no source of bleeding (most likely diverticular).  . Hiatal hernia   . Hypertension   . Iron deficiency   . Menopausal symptoms   . Morbid obesity ([place])   . Renal insufficiency   . Thyroid disease    hypothyroidism   Social History   Socioeconomic History  . Marital status: Widowed    Spouse name: Not on file  . Number of children: Not on file  . Years of education: Not on file  . Highest education level: Not on file  Occupational History  . Not on file  Tobacco Use  . Smoking status: Never Smoker  . Smokeless tobacco: Never Used  Vaping Use  . Vaping Use: Never used  Substance and Sexual Activity  . Alcohol use: No  . Drug use: No  . Sexual activity: Not on file  Other Topics Concern  . Not on file  Social History Narrative  . Not on file   Social Determinants of Health   Financial Resource Strain: Not on file  Food Insecurity: Not on file  Transportation Needs: Not on file  Physical Activity: Not on file  Stress: Not on file  Social Connections: Not on file   Family History  Problem Relation Age of Onset  . Breast cancer Mother 26   Scheduled Meds: . atorvastatin  20 mg Oral Daily  . bisacodyl  10 mg Rectal QHS  . Chlorhexidine Gluconate Cloth  6 each Topical Daily  . clopidogrel  75 mg Oral Daily  . dextromethorphan-guaiFENesin  1 tablet Oral BID  . doxazosin  2 mg Oral Q12H  . enoxaparin (LOVENOX) injection  40 mg Subcutaneous  Q24H  . furosemide  80 mg Intravenous Daily  . ipratropium-albuterol  3 mL Nebulization BID  . iron polysaccharides  150 mg Oral Daily  . levETIRAcetam  250 mg Oral BID  . levothyroxine  100 mcg Oral Q0600  . lidocaine  1 patch Transdermal Q24H  . magnesium oxide  400 mg Oral Daily  . mometasone-formoterol  2 puff Inhalation BID  . pantoprazole  20 mg Oral Daily  . predniSONE  10 mg Oral Q breakfast  . pregabalin  50 mg Oral q AM   And  . pregabalin  100 mg Oral QHS  . sodium chloride flush  10-40 mL Intracatheter Q12H  . sodium phosphate  1 enema Rectal Daily   Continuous Infusions: PRN Meds:.acetaminophen, albuterol, docusate sodium, melatonin, nitroGLYCERIN, ondansetron (ZOFRAN) IV, polyethylene glycol, sodium chloride flush, traMADol Allergies  Allergen Reactions  . Atenolol Other (See Comments)    Other reaction(s): Other (See Comments) Decreased heart rate Decreased heart rate  . Codeine Shortness Of Breath    Rash, difficulty breathing, nausea.  Marland Kitchen  Losartan Other (See Comments)    Hyperkalemia  . Morphine And Related Shortness Of Breath    Rash, difficulty breathing, nausea.   . Fentanyl Other (See Comments)    hallucinations  . Nsaids Other (See Comments)    Other reaction(s): Unknown  . Rofecoxib Other (See Comments)    Other reaction(s): Unknown  . Sucralfate Other (See Comments)    Throat tightness  . Sulfa Antibiotics Other (See Comments)    Other reaction(s): Unknown  . Tramadol Other (See Comments)    lethargy   Review of Systems  Constitutional: Positive for activity change and fatigue.  Neurological: Positive for weakness.    Physical Exam Constitutional:      General: She is not in acute distress.    Comments: Sitting up in chair, sleeping when I enter but wakes easily  Pulmonary:     Effort: Pulmonary effort is normal.  Skin:    General: Skin is warm and dry.  Neurological:     Mental Status: She is alert and oriented to person, place, and  time.     Vital Signs: BP (!) 144/56 (BP Location: Left Arm)   Pulse 72   Temp (!) 97.4 F (36.3 C) (Oral)   Resp 18   Ht 5' 2"  (1.575 m)   Wt 80.4 kg   SpO2 93%   BMI 32.41 kg/m  Pain Scale: 0-10 POSS *See Group Information*: 1-Acceptable,Awake and alert Pain Score: 7    SpO2: SpO2: 93 % O2 Device:SpO2: 93 % O2 Flow Rate: .O2 Flow Rate (L/min): 2 L/min  IO: Intake/output summary:   Intake/Output Summary (Last 24 hours) at 06/01/2020 1404 Last data filed at 06/01/2020 1007 Gross per 24 hour  Intake 480 ml  Output 400 ml  Net 80 ml    LBM: Last BM Date: 05/29/20 Baseline Weight: Weight: 86.2 kg Most recent weight: Weight: 80.4 kg     Palliative Assessment/Data: PPS 40%    Time Total: 40 minutes Greater than 50%  of this time was spent counseling and coordinating care related to the above assessment and plan.  Juel Burrow, DNP, AGNP-C Palliative Medicine Team [phone] Pager: [phone]

## 2020-06-02 ENCOUNTER — Inpatient Hospital Stay: Payer: Medicare Other

## 2020-06-02 DIAGNOSIS — J189 Pneumonia, unspecified organism: Secondary | ICD-10-CM | POA: Diagnosis not present

## 2020-06-02 LAB — BASIC METABOLIC PANEL
Anion gap: 7 (ref 5–15)
BUN: 47 mg/dL — ABNORMAL HIGH (ref 8–23)
CO2: 27 mmol/L (ref 22–32)
Calcium: 8.2 mg/dL — ABNORMAL LOW (ref 8.9–10.3)
Chloride: 108 mmol/L (ref 98–111)
Creatinine, Ser: 1.06 mg/dL — ABNORMAL HIGH (ref 0.44–1.00)
GFR, Estimated: 50 mL/min — ABNORMAL LOW (ref >60)
Glucose, Bld: 122 mg/dL — ABNORMAL HIGH (ref 70–99)
Potassium: 4.7 mmol/L (ref 3.5–5.1)
Sodium: 142 mmol/L (ref 135–145)

## 2020-06-02 MED ORDER — HYDRALAZINE HCL 20 MG/ML IJ SOLN
10.0000 mg | Freq: Four times a day (QID) | INTRAMUSCULAR | Status: DC | PRN
  Administered 2020-06-02 (×2): 10 mg via INTRAVENOUS
  Filled 2020-06-02 (×2): qty 1

## 2020-06-02 NOTE — Progress Notes (Signed)
PROGRESS NOTE    Amy [name]  OZY:[id number] DOB: 03-04-1927 DOA: 05/18/2020 PCP: Alvester Morin, MD     Brief Narrative:  Patient was admitted on 05/18/2020 with shortness of breath and abdominal pain. Past medical history of hypertension, hyperlipidemia, type 2 diabetes, COPD, CAD, hypothyroidism, atrial fibrillation, seizure, chronic kidney disease, anemia, CHF. She was found to have clinical sepsis with multifocal pneumonia and left pleural effusion. The patient has received 10 days of antibiotics. She had a thoracentesis on 05/24/2020 removing 250 mL of fluid. Another thoracentesis on 05/28/2020 removing 600 mL of fluid. Nothing growing on a either culture. Follow-up cytology of the second thoracentesis. Repeat chest x-ray little bit better than previous x-rays. Pulmonary does not want to do another thoracentesis at this point. Patient was also found to have NSTEMI. Patient also had acute metabolic encephalopathy and was barely responsive 5 days ago. Patient also has bilateral feet ankle and leg pain and her Lyrica was restarted and slowly improving.  Hospitalization further complicated by difficult disposition planning.  New events last 24 hours / Subjective: Patient resting in bed, very slow to respond to my questions this morning, although remains alert. Looks like she received lyrica and keppra this morning.   Assessment & Plan:   Principal Problem:   Multifocal pneumonia Active Problems:   Acquired hypothyroidism   HLD (hyperlipidemia)   Essential hypertension   Iron deficiency anemia   Chronic diastolic CHF (congestive heart failure) (HCC)   Anemia of chronic disease   COPD with chronic bronchitis (HCC)   Severe sepsis (HCC)   CKD (chronic kidney disease) stage 4, GFR 15-29 ml/min (HCC)   HTN (hypertension)   GERD (gastroesophageal reflux disease)   CAD (coronary artery disease)   NSTEMI (non-ST elevated myocardial infarction) (HCC)   AF (paroxysmal  atrial fibrillation) (HCC)   Seizure (HCC)   Elevated troponin   Abdominal pain   Diarrhea   Acute respiratory failure with hypoxia (HCC)   Recurrent left pleural effusion   Acute metabolic encephalopathy   Pain    Multifocal pneumonia Recurrent pleural effusion Severe sepsis secondary to above Patient with multiple thoracentesis during admission Thora 3/31, 250 cc fluid Thora 4/4, 600 cc fluid Follow-up chest x-ray shows improved air entry but persistent effusion Pulmonary ordered repeat Thora however upon ultrasound there was insufficient fluid so no tap was performed Patient has been weaned to room air as of 4/7 CT chest revealed bilateral small pleural effusion with compressive left sided atelectasis  Patient has completed 10-day course of antibiotics Pulmonology following, holding lasix, continue MetaNeb Recommend IS  Gout  Prednisone started  ?Left facial droop Some swelling of left lower face, very mild asymmetric smile  Head CT negative for acute change   Acute metabolic encephalopathy Suspect multifactorial etiology in the setting of sepsis and infection CT scan head negative x2  Delirium precautions Frequent neuro checks Worse today in setting of polypharmacy. Stop lyrica, tramadol, keppra   NSTEMI Medical management per cardiology Was on heparin drip 2 days beginning of hospital course Troponin peak 1196 Continue Plavix, statin  Acute hypoxemic respiratory failure, resolved Low pulse ox 85% room air Weaned to room air as of 4/7  Acute kidney injury on chronic kidney disease stage IIIb Peak creatinine 1.84 Creatinine improved  Essential hypertension Norvasc discontinued  PAF Rate controlled No anticoagulation due to history of GI bleed  Seizure disorder Hold Keppra  Iron deficiency anemia P.o. iron supplementation  Weakness Functional decline Continue PT OT, TOC assisting with  disposition    DVT prophylaxis:  enoxaparin  (LOVENOX) injection 40 mg Start: 06/01/20 2200 Place and maintain sequential compression device Start: 05/21/20 1353  Code Status: DNR Family Communication: None at bedside  Disposition Plan:  Status is: Inpatient  Remains inpatient appropriate because:Unsafe d/c plan and Inpatient level of care appropriate due to severity of illness   Dispo: The patient is from: SNF              Anticipated d/c is to: SNF              Patient currently is not medically stable to d/c. Continue treatment as outlined above. Unclear disposition, patient is out of SNF days, daughter is trying to find out what the out of pocket cost will be    Difficult to place patient Yes   Antimicrobials:  Anti-infectives (From admission, onward)   Start     Dose/Rate Route Frequency Ordered Stop   05/25/20 1000  piperacillin-tazobactam (ZOSYN) IVPB 3.375 g        3.375 g 12.5 mL/hr over 240 Minutes Intravenous Every 8 hours 05/25/20 0849 05/28/20 0154   05/24/20 2200  piperacillin-tazobactam (ZOSYN) IVPB 3.375 g  Status:  Discontinued        3.375 g 12.5 mL/hr over 240 Minutes Intravenous Every 12 hours 05/24/20 1433 05/25/20 0849   05/24/20 0300  vancomycin (VANCOREADY) IVPB 750 mg/150 mL  Status:  Discontinued        750 mg 150 mL/hr over 60 Minutes Intravenous Every 48 hours 05/22/20 0219 05/23/20 0956   05/22/20 1400  piperacillin-tazobactam (ZOSYN) IVPB 3.375 g  Status:  Discontinued        3.375 g 12.5 mL/hr over 240 Minutes Intravenous Every 8 hours 05/22/20 1055 05/24/20 1433   05/22/20 0300  vancomycin (VANCOREADY) IVPB 2000 mg/400 mL        2,000 mg 200 mL/hr over 120 Minutes Intravenous  Once 05/22/20 0212 05/22/20 0426   05/20/20 0800  vancomycin (VANCOREADY) IVPB 750 mg/150 mL  Status:  Discontinued        750 mg 150 mL/hr over 60 Minutes Intravenous Every 48 hours 05/18/20 1309 05/19/20 0805   05/19/20 1000  ceFEPIme (MAXIPIME) 2 g in sodium chloride 0.9 % 100 mL IVPB  Status:  Discontinued         2 g 200 mL/hr over 30 Minutes Intravenous Every 24 hours 05/18/20 1253 05/22/20 1055   05/18/20 1000  vancomycin (VANCOREADY) IVPB 750 mg/150 mL        750 mg 150 mL/hr over 60 Minutes Intravenous  Once 05/18/20 0949 05/18/20 1405   05/18/20 0900  ceFEPIme (MAXIPIME) 2 g in sodium chloride 0.9 % 100 mL IVPB        2 g 200 mL/hr over 30 Minutes Intravenous  Once 05/18/20 0845 05/18/20 1141   05/18/20 0900  metroNIDAZOLE (FLAGYL) IVPB 500 mg        500 mg 100 mL/hr over 60 Minutes Intravenous  Once 05/18/20 0845 05/18/20 1450   05/18/20 0900  vancomycin (VANCOCIN) IVPB 1000 mg/200 mL premix        1,000 mg 200 mL/hr over 60 Minutes Intravenous  Once 05/18/20 0845 05/18/20 1242       Objective: Vitals:   06/02/20 0228 06/02/20 0446 06/02/20 0727 06/02/20 0736  BP: (!) 190/68 (!) 166/65  (!) 169/76  Pulse: 65   72  Resp: 16   19  Temp:  97.8 F (36.6 C)  (!) 97.3 F (36.3  C)  TempSrc:    Oral  SpO2: 97%  91% 95%  Weight:      Height:        Intake/Output Summary (Last 24 hours) at 06/02/2020 1048 Last data filed at 06/02/2020 0247 Gross per 24 hour  Intake 300 ml  Output 2000 ml  Net -1700 ml   Filed Weights   05/27/20 0500 05/28/20 0424 06/01/20 0459  Weight: 82.9 kg 81.3 kg 80.4 kg    Examination: General exam: Appears calm and comfortable  Respiratory system: Clear to auscultation anteriorly, without distress  Cardiovascular system: S1 & S2 heard. No pedal edema. Gastrointestinal system: Abdomen is nondistended, soft and nontender. Normal bowel sounds heard. Central nervous system: Alert, very slow to respond  Extremities: Symmetric in appearance bilaterally  Skin: No rashes, lesions or ulcers on exposed skin   Data Reviewed: I have personally reviewed following labs and imaging studies  CBC: Recent Labs  Lab 05/27/20 0543 05/28/20 0646 05/30/20 0839 06/01/20 0819  WBC 12.3* 11.0* 12.1* 7.9  NEUTROABS  --   --  8.4*  --   HGB 9.6* 9.9* 13.2 10.4*  HCT  30.3* 31.6* 43.1 33.0*  MCV 93.8 94.3 95.8 94.0  PLT 235 263 350 979   Basic Metabolic Panel: Recent Labs  Lab 05/27/20 0543 05/28/20 0646 05/29/20 1010 05/30/20 0742 05/31/20 1009 06/01/20 0819 06/02/20 0504  NA 144 145 142 142 142 145 142  K 4.2 4.3 5.1 4.9 4.6 4.5 4.7  CL 113* 114* 113* 113* 110 112* 108  CO2 24 26 20* 24 24 26 27   GLUCOSE 138* 119* 118* 107* 148* 99 122*  BUN 35* 37* 41* 42* 42* 45* 47*  CREATININE 1.39* 1.30* 1.29* 1.20* 1.21* 1.07* 1.06*  CALCIUM 8.2* 8.2* 8.3* 8.1* 7.9* 8.1* 8.2*  MG 1.8 1.9  --   --   --   --   --    GFR: Estimated Creatinine Clearance: 33.9 mL/min (A) (by C-G formula based on SCr of 1.06 mg/dL (H)). Liver Function Tests: No results for input(s): AST, ALT, ALKPHOS, BILITOT, PROT, ALBUMIN in the last 168 hours. No results for input(s): LIPASE, AMYLASE in the last 168 hours. No results for input(s): AMMONIA in the last 168 hours. Coagulation Profile: No results for input(s): INR, PROTIME in the last 168 hours. Cardiac Enzymes: No results for input(s): CKTOTAL, CKMB, CKMBINDEX, TROPONINI in the last 168 hours. BNP (last 3 results) No results for input(s): PROBNP in the last 8760 hours. HbA1C: No results for input(s): HGBA1C in the last 72 hours. CBG: No results for input(s): GLUCAP in the last 168 hours. Lipid Profile: No results for input(s): CHOL, HDL, LDLCALC, TRIG, CHOLHDL, LDLDIRECT in the last 72 hours. Thyroid Function Tests: No results for input(s): TSH, T4TOTAL, FREET4, T3FREE, THYROIDAB in the last 72 hours. Anemia Panel: No results for input(s): VITAMINB12, FOLATE, FERRITIN, TIBC, IRON, RETICCTPCT in the last 72 hours. Sepsis Labs: No results for input(s): PROCALCITON, LATICACIDVEN in the last 168 hours.  Recent Results (from the past 240 hour(s))  Body fluid culture w Gram Stain     Status: None   Collection Time: 05/24/20 11:58 AM   Specimen: PATH Cytology Pleural fluid  Result Value Ref Range Status   Specimen  Description   Final    PLEURAL Performed at [facility], [address], [place], [place] [address]    Special Requests   Final    NONE Performed at [facility], [place],  [place] [address]    Gram Stain   Final    FEW WBC PRESENT, PREDOMINANTLY MONONUCLEAR NO ORGANISMS SEEN    Culture   Final    NO GROWTH 3 DAYS Performed at [facility] [address] [place], [place] [address]    Report Status 05/27/2020 FINAL  Final  Body fluid culture w Gram Stain     Status: None   Collection Time: 05/28/20 11:00 AM   Specimen: PATH Cytology Pleural fluid  Result Value Ref Range Status   Specimen Description   Final    PLEURAL Performed at [facility], [address] [place], [place] [address]    Special Requests   Final    NONE Performed at [facility], [address] [place], [place] [address]    Gram Stain   Final    RARE WBC PRESENT, PREDOMINANTLY MONONUCLEAR NO ORGANISMS SEEN    Culture   Final    NO GROWTH 3 DAYS Performed at [facility], [place] [address] [place], [place] [address]    Report Status 06/01/2020 FINAL  Final      Radiology Studies: CT HEAD WO CONTRAST  Result Date: 06/01/2020 CLINICAL DATA:  Initial evaluation for acute neuro deficit, stroke suspected, left facial droop. EXAM: CT HEAD WITHOUT CONTRAST TECHNIQUE: Contiguous axial images were obtained from the base of the skull through the vertex without intravenous contrast. COMPARISON:  Prior head CT from 05/24/2020. FINDINGS: Brain: Generalized age-related cerebral atrophy with mild chronic small vessel ischemic disease. No acute intracranial hemorrhage. No acute large vessel territory infarct. No mass lesion, midline shift or mass effect. No hydrocephalus or extra-axial fluid collection. Vascular: No hyperdense vessel. Scattered vascular calcifications noted within the carotid siphons. Skull: Scalp soft tissues and calvarium within normal  limits. Sinuses/Orbits: Globes and orbital soft tissues demonstrate no acute finding. Paranasal sinuses are largely clear. No mastoid effusion. Other: None. IMPRESSION: 1. No acute intracranial abnormality. 2. Generalized age-related cerebral atrophy with mild chronic small vessel ischemic disease. Electronically Signed   By: Jeannine Boga M.D.   On: 06/01/2020 15:54   CT CHEST WO CONTRAST  Result Date: 05/31/2020 CLINICAL DATA:  Respiratory failure, post thoracentesis, pneumonia, RIGHT shoulder pain EXAM: CT CHEST WITHOUT CONTRAST TECHNIQUE: Multidetector CT imaging of the chest was performed following the standard protocol without IV contrast. Sagittal and coronal MPR images reconstructed from axial data set. COMPARISON:  05/27/2020 FINDINGS: Cardiovascular: Atherosclerotic calcifications aorta, proximal great vessels and coronary arteries. Enlargement of cardiac chambers. Mitral annular calcification. Aorta normal caliber. Minimal pericardial effusion. Mediastinum/Nodes: Large hiatal hernia. Esophagus unremarkable. Base of cervical region normal appearance. Normal sized mediastinal lymph nodes. No thoracic adenopathy. Lungs/Pleura: BILATERAL small pleural effusions. Atelectasis of LEFT lower lobe with compressive atelectasis in BILATERAL upper lobes LEFT greater than RIGHT and in RIGHT lower lobe. No definite pulmonary mass identified though unable to exclude underlying abnormalities within consolidated/atelectatic LEFT lung. No pneumothorax. Upper Abdomen: Multiple cysts and small calcified nodule at upper pole of LEFT kidney. Musculoskeletal: Chronic displaced fracture at surgical neck RIGHT humerus. Osseous demineralization. IMPRESSION: BILATERAL small pleural effusions with compressive atelectasis of the lungs greatest in LEFT lower lobe. No definite pulmonary mass identified though unable to exclude underlying abnormalities within consolidated/atelectatic LEFT lung. Large hiatal hernia. Extensive  atherosclerotic calcifications including coronary arteries. Chronic displaced fracture of the proximal RIGHT humerus. Aortic Atherosclerosis (ICD10-I70.0). Electronically Signed   By: Lavonia Dana M.D.   On: 05/31/2020 15:36   DG Chest [facility]  1 View  Result Date: 06/02/2020 CLINICAL DATA:  87 year old female with history of acute respiratory failure with hypoxia. Community-acquired pneumonia. EXAM: PORTABLE CHEST 1 VIEW COMPARISON:  Chest x-ray 05/29/2020. FINDINGS: Large left pleural effusion. Opacity in the left mid to lower hemithorax compatible with underlying atelectasis and/or consolidation. Ill-defined opacity in the right base worsened compared to the prior study, partially obscuring the right hemidiaphragm, concerning for developing consolidation in the right lower lobe and likely right middle lobe. No pneumothorax. No evidence of pulmonary edema. Cardiac silhouette is largely obscured. Upper mediastinal contours are within normal limits allowing for patient rotation to the left. Atherosclerotic calcifications in the thoracic aorta. Severe calcifications of the mitral annulus. Old comminuted fracture of the right proximal humerus with chronic nonunion. IMPRESSION: 1. Worsening severe multilobar bilateral pneumonia with large left parapneumonic pleural effusion. 2. Aortic atherosclerosis. Electronically Signed   By: Vinnie Langton M.D.   On: 06/02/2020 09:52      Scheduled Meds: . atorvastatin  20 mg Oral Daily  . bisacodyl  10 mg Rectal QHS  . Chlorhexidine Gluconate Cloth  6 each Topical Daily  . clopidogrel  75 mg Oral Daily  . dextromethorphan-guaiFENesin  1 tablet Oral BID  . doxazosin  2 mg Oral Q12H  . enoxaparin (LOVENOX) injection  40 mg Subcutaneous Q24H  . iron polysaccharides  150 mg Oral Daily  . levothyroxine  100 mcg Oral Q0600  . lidocaine  1 patch Transdermal Q24H  . magnesium oxide  400 mg Oral Daily  . mometasone-formoterol  2 puff Inhalation BID  . pantoprazole  20 mg  Oral Daily  . predniSONE  10 mg Oral Q breakfast  . pregabalin  50 mg Oral q AM   And  . pregabalin  100 mg Oral QHS  . sodium chloride flush  10-40 mL Intracatheter Q12H  . sodium phosphate  1 enema Rectal Daily   Continuous Infusions:   LOS: 15 days      Time spent: 25 minutes   Dessa Phi, DO [facility] 06/02/2020, 10:48 AM   Available via Epic secure chat 7am-7pm After these hours, please refer to coverage provider listed on [email or web address]

## 2020-06-02 NOTE — Progress Notes (Signed)
Pt BP 190/68 . NP notified. PRN medication orders placed.

## 2020-06-02 NOTE — Progress Notes (Signed)
Patient seen for metaneb tx. Patient noted to be half way down in bed, lethargic, not responding to any of my request but moaning. Asked charge RN to help assess, as patient's nurse was assisting another patient. We repositioned patient in bed. Placed patient on bipap since ams was change from what I have seen for her. Patient moaning out possibly due to pain. Was able to update her RN as to interventions by RT. Will continue to assess. Metaneb deferred at this time.

## 2020-06-02 NOTE — Progress Notes (Signed)
Pulmonary Medicine          Date: 06/02/2020,   MRN# [id number] Amy [name] [birth year]     AdmissionWeight: 86.2 kg                 CurrentWeight: 80.4 kg   Referring physician: Dr Earleen Newport    CHIEF COMPLAINT:   Multifocal Pneumonia with parapneumonic effusion.   HISTORY OF PRESENT ILLNESS   As per admission h/p Patient states that she started having abdominal pain which was located in the left side of the abdomen, associated with nausea and diarrhea.  She has had 3-4 times of loose stool bowel movement.  She also had fever of 101 in facility, currently body temperature is 97.5 in ED. Patient has cough and shortness of breath which has been progressively worsening.  She was found to have oxygen desaturation to 85% on room air, which improved to 94% on 5 L oxygen.  Denies chest pain.  No rectal bleeding or dark stool.  Pt was found to have WBC 4.7, lactic acid 2.9, 1.5, INR 1.1, PTT 31, troponin level 638, 933, positive Covid PCR, renal function close to baseline, temperature 97.5, softer blood pressure 93/39, heart rate 98, RR 28.  Chest x-ray showed multifocal infiltration mainly in the left side, vascular congestion and cardiomegaly.  CT of the chest/abdomen/pelvis showed left lower lobe infiltration,  no acute issues intra-abdominally. After 9 day pneumonia tx and thoracentesis patient still has bilateral infiltrates and PCCM consultation placed for additional evaluation and management.   05/28/20- Patient is s/p thoracentesis, she feels better already. She is working with [employer] therapy and feels that after first session she is already breathing better.  She had OT today. We discussed using incentive spiroemetry.  I discussed her care plan with Dr Leslye Peer today.   05/29/20- repeat CXR this am with residual effusion on left and bibasilar atelectasis.  We discussed possibly needing repeat thoracentesis.  She is on room air , daughter in room. I reviwed plan with  family , Respiratory therapist Demetria and Attending physician Dr Leslye Peer. We will focus on continuing medical mgt, placement, PT/OT and outpatient follow up.   05/30/20- patient examined at bedside. She continues to be with SOB and hypoxemia on 2L/min which is new for her. She does still have fluid around left lung and felt most improved post thoracentesis. She used Metaneb and waited another 24h with diuresis but has not been able to improve further. We agreed on repeat thoracentesis today, patient may sign her own consent  05/31/20- patient is improved, she had repeat thoracentesis ordered with minimal effusions noted on [place] and no tap performed.  She is off nasal canula oxygen now on room air.  She is still with decreased air entry on left which may just be from atelectasis and if that's the case we can initiate DC planning to SNF per PT recommendation.  Will obtain non conrasted CT chest today to further evaluate.   n 06/02/2020- no acute distress.  Diuresis was increased yesterday due to residual pleural effusions with compressive atelectasis, with resultant over 2 L urine output overnight.  Repeat chest x-ray done today reviewed by me with significant improvement noted bilaterally.  She continues to work with physical therapy and respiratory therapy with recruitment maneuvers utilizing MetaNeb device.  Her renal function is actually improving despite more aggressive diuresis.  Fluid from thoracentesis remains simple appearing with technically an exudative profile however this is likely due to  concentration from diuresis.  Overall her condition is improving and should be safe for discharge with continued home health and PT.  Appears there are no beds for SNF placement.  PAST MEDICAL HISTORY   Past Medical History:  Diagnosis Date  . Bladder incontinence   . CAD (coronary artery disease)    a. 05/2009 Cath: LAD 90p (Xience 2.75 x 12 mm DES), 55m D1 40, LCx 40p/m, RCA 30/40/30p/m, RCA 30/25d;  b.   12/2011 Lexiscan MV: no ischemia, breast attenuation artifact, normal EF-->Low risk; c. 10/2016 MV: fixed apical defect, most likely apical thinning and attenuation, No ischemia, EF 60%.  . [place]    ov[place]  . Carotid arterial disease w/ R Carotid Bruit (HCC)    a. 08/2016 Carotid U/S: 40-59% bilat ICA stenosis - f/u 1 yr.  . Chronic diastolic (congestive) heart failure ([place]    a. Echo 09/2015: EF 55-60% w/ Grade 1 DD, sev Ca2+ MV annulus, mildly dil LA; b. 09/2016 Echo: EF 65-70%, Gr2 DD, mildly dil LA/RA, nl RV fxn.  . Chronic Dyspnea on exertion   . CKD (chronic kidney disease) stage 3, GFR 30-59 ml/min (HCC)   . Degenerative arthritis of knee    bilateral knees  . Diabetes mellitus    Type II  . GIB (gastrointestinal bleeding)    a. 08/2016 Admission w/ presyncope/anemia/melena-->req Transfusion-->endo ok, colonoscopy w/ polyps but no source of bleeding (most likely diverticular).  . Hiatal hernia   . Hypertension   . Iron deficiency   . Menopausal symptoms   . Morbid obesity ([place]   . Renal insufficiency   . Thyroid disease    hypothyroidism     SURGICAL HISTORY   Past Surgical History:  Procedure Laterality Date  . COLONOSCOPY  2015  . COLONOSCOPY WITH PROPOFOL N/A 09/25/2016   Procedure: COLONOSCOPY WITH PROPOFOL;  Surgeon: AJonathon Bellows MD;  [facility];  Service: Gastroenterology;  Laterality: N/A;  . COLONOSCOPY WITH PROPOFOL N/A 09/26/2016   Procedure: COLONOSCOPY WITH PROPOFOL;  Surgeon: AJonathon Bellows MD;  [facility];  Service: Gastroenterology;  Laterality: N/A;  . ESOPHAGOGASTRODUODENOSCOPY (EGD) WITH PROPOFOL N/A 09/25/2016   Procedure: ESOPHAGOGASTRODUODENOSCOPY (EGD) WITH PROPOFOL;  Surgeon: AJonathon Bellows MD;  [facility];  Service: Gastroenterology;  Laterality: N/A;  . RENAL ANGIOGRAPHY N/A 02/07/2019   Procedure: RENAL ANGIOGRAPHY;  Surgeon: DAlgernon Huxley MD;  [facility];  Service: Cardiovascular;   Laterality: N/A;  . REPLACEMENT TOTAL KNEE BILATERAL    . TOTAL VAGINAL HYSTERECTOMY     ovarian mass, not cancerous  . UPPER GI ENDOSCOPY  2015     FAMILY HISTORY   Family History  Problem Relation Age of Onset  . Breast cancer Mother 827    SOCIAL HISTORY   Social History   Tobacco Use  . Smoking status: Never Smoker  . Smokeless tobacco: Never Used  Vaping Use  . Vaping Use: Never used  Substance Use Topics  . Alcohol use: No  . Drug use: No     MEDICATIONS    Home Medication:    Current Medication:  Current Facility-Administered Medications:  .  acetaminophen (TYLENOL) tablet 650 mg, 650 mg, Oral, Q6H PRN, POswald Hillock RPH, 650 mg at 06/02/20 0843 .  albuterol (PROVENTIL) (2.5 MG/3ML) 0.083% nebulizer solution 2.5 mg, 2.5 mg, Nebulization, Q4H PRN, NIvor Costa MD .  atorvastatin (LIPITOR) tablet 20 mg, 20 mg, Oral, Daily, POswald Hillock RPH, 20 mg at 06/01/20 2008 .  bisacodyl (DULCOLAX) suppository 10 mg, 10 mg, Rectal, QHS, Wieting, Richard, MD, 10 mg at 05/25/20 2050 .  Chlorhexidine Gluconate Cloth 2 % PADS 6 each, 6 each, Topical, Daily, Loletha Grayer, MD, 6 each at 06/01/20 1725 .  clopidogrel (PLAVIX) tablet 75 mg, 75 mg, Oral, Daily, Ivor Costa, MD, 75 mg at 06/02/20 0843 .  dextromethorphan-guaiFENesin (MUCINEX DM) 30-600 MG per 12 hr tablet 1 tablet, 1 tablet, Oral, BID, Wyvonnia Dusky, MD, 1 tablet at 06/02/20 0843 .  docusate sodium (COLACE) capsule 100 mg, 100 mg, Oral, BID PRN, Ivor Costa, MD, 100 mg at 06/01/20 2008 .  doxazosin (CARDURA) tablet 2 mg, 2 mg, Oral, Q12H, Sreenath, Sudheer B, MD, 2 mg at 06/02/20 0845 .  enoxaparin (LOVENOX) injection 40 mg, 40 mg, Subcutaneous, Q24H, Beers, Shanon Brow, RPH, 40 mg at 06/01/20 2211 .  furosemide (LASIX) injection 80 mg, 80 mg, Intravenous, Daily, Ottie Glazier, MD, 80 mg at 06/02/20 0842 .  hydrALAZINE (APRESOLINE) injection 10 mg, 10 mg, Intravenous, Q6H PRN, Sharion Settler, NP, 10 mg  at 06/02/20 0240 .  ipratropium-albuterol (DUONEB) 0.5-2.5 (3) MG/3ML nebulizer solution 3 mL, 3 mL, Nebulization, BID, Wyvonnia Dusky, MD, 3 mL at 06/02/20 9191 .  iron polysaccharides (NIFEREX) capsule 150 mg, 150 mg, Oral, Daily, Ivor Costa, MD, 150 mg at 06/02/20 0844 .  levETIRAcetam (KEPPRA) tablet 250 mg, 250 mg, Oral, BID, Oswald Hillock, RPH, 250 mg at 06/02/20 0844 .  levothyroxine (SYNTHROID) tablet 100 mcg, 100 mcg, Oral, Q0600, Wyvonnia Dusky, MD, 100 mcg at 06/02/20 0634 .  lidocaine (LIDODERM) 5 % 1 patch, 1 patch, Transdermal, Q24H, Sreenath, Sudheer B, MD, 1 patch at 06/01/20 1356 .  magnesium oxide (MAG-OX) tablet 400 mg, 400 mg, Oral, Daily, Leslye Peer, Richard, MD, 400 mg at 06/02/20 0843 .  melatonin tablet 5 mg, 5 mg, Oral, QHS PRN, Ivor Costa, MD, 5 mg at 05/31/20 2115 .  mometasone-formoterol (DULERA) 200-5 MCG/ACT inhaler 2 puff, 2 puff, Inhalation, BID, Ivor Costa, MD, 2 puff at 06/01/20 2011 .  nitroGLYCERIN (NITROSTAT) SL tablet 0.4 mg, 0.4 mg, Sublingual, Q5 Min x 3 PRN, Ivor Costa, MD .  ondansetron [facility]) injection 4 mg, 4 mg, Intravenous, Q8H PRN, Ivor Costa, MD .  pantoprazole (PROTONIX) EC tablet 20 mg, 20 mg, Oral, Daily, Oswald Hillock, RPH, 20 mg at 06/02/20 0844 .  polyethylene glycol (MIRALAX / GLYCOLAX) packet 17 g, 17 g, Oral, Daily PRN, Ivor Costa, MD .  predniSONE (DELTASONE) tablet 10 mg, 10 mg, Oral, Q breakfast, Dessa Phi, DO, 10 mg at 06/02/20 0844 .  pregabalin (LYRICA) capsule 50 mg, 50 mg, Oral, q AM, 50 mg at 06/01/20 0906 **AND** pregabalin (LYRICA) capsule 100 mg, 100 mg, Oral, QHS, Sreenath, Sudheer B, MD, 100 mg at 06/01/20 2008 .  simethicone (MYLICON) chewable tablet 160 mg, 160 mg, Oral, QID PRN, Sharion Settler, NP .  sodium chloride flush (NS) 0.9 % injection 10-40 mL, 10-40 mL, Intracatheter, Q12H, Gollan, Kathlene November, MD, 10 mL at 06/01/20 2012 .  sodium chloride flush (NS) 0.9 % injection 10-40 mL, 10-40 mL, Intracatheter,  PRN, Rockey Situ, Kathlene November, MD .  sodium phosphate (FLEET) 7-19 GM/118ML enema 1 enema, 1 enema, Rectal, Daily, Loletha Grayer, MD, 1 enema at 05/25/20 0901 .  traMADol (ULTRAM) tablet 50 mg, 50 mg, Oral, Q12H PRN, Wyvonnia Dusky, MD, 50 mg at 06/01/20 6606  Facility-Administered Medications Ordered in Other Encounters:  .  0.9 %  sodium chloride infusion, , Intravenous,  Continuous, Lloyd Huger, MD    ALLERGIES   Atenolol, Codeine, Losartan, Morphine and related, Fentanyl, Nsaids, Rofecoxib, Sucralfate, Sulfa antibiotics, and Tramadol     REVIEW OF SYSTEMS    Review of Systems:  Gen:  Denies  fever, sweats, chills weigh loss  HEENT: Denies blurred vision, double vision, ear pain, eye pain, hearing loss, nose bleeds, sore throat Cardiac:  No dizziness, chest pain or heaviness, chest tightness,edema Resp:   Denies cough or sputum porduction, shortness of breath,wheezing, hemoptysis,  Gi: Denies swallowing difficulty, stomach pain, nausea or vomiting, diarrhea, constipation, bowel incontinence Gu:  Denies bladder incontinence, burning urine Ext:   Denies Joint pain, stiffness or swelling Skin: Denies  skin rash, easy bruising or bleeding or hives Endoc:  Denies polyuria, polydipsia , polyphagia or weight change Psych:   Denies depression, insomnia or hallucinations   Other:  All other systems negative   VS: BP (!) 169/76 (BP Location: Left Arm) Comment: okay per MD, PRNs if needed  Pulse 72   Temp (!) 97.3 F (36.3 C) (Oral)   Resp 19   Ht 5' 2"  (1.575 m)   Wt 80.4 kg   SpO2 95%   BMI 32.41 kg/m      PHYSICAL EXAM    GENERAL:NAD, no fevers, chills, no weakness no fatigue HEAD: Normocephalic, atraumatic.  EYES: Pupils equal, round, reactive to light. Extraocular muscles intact. No scleral icterus.  MOUTH: Moist mucosal membrane. Dentition intact. No abscess noted.  EAR, NOSE, THROAT: Clear without exudates. No external lesions.  NECK: Supple. No  thyromegaly. No nodules. No JVD.  PULMONARY: decreased breath sounds on left  CARDIOVASCULAR: S1 and S2. Regular rate and rhythm. No murmurs, rubs, or gallops. No edema. Pedal pulses 2+ bilaterally.  GASTROINTESTINAL: Soft, nontender, nondistended. No masses. Positive bowel sounds. No hepatosplenomegaly.  MUSCULOSKELETAL: No swelling, clubbing, or edema. Range of motion full in all extremities.  NEUROLOGIC: Cranial nerves II through XII are intact. No gross focal neurological deficits. Sensation intact. Reflexes intact.  SKIN: No ulceration, lesions, rashes, or cyanosis. Skin warm and dry. Turgor intact.  PSYCHIATRIC: Mood, affect within normal limits. The patient is awake, alert and oriented x 3. Insight, judgment intact.       IMAGING    CT HEAD WO CONTRAST  Result Date: 06/01/2020 CLINICAL DATA:  Initial evaluation for acute neuro deficit, stroke suspected, left facial droop. EXAM: CT HEAD WITHOUT CONTRAST TECHNIQUE: Contiguous axial images were obtained from the base of the skull through the vertex without intravenous contrast. COMPARISON:  Prior head CT from 05/24/2020. FINDINGS: Brain: Generalized age-related cerebral atrophy with mild chronic small vessel ischemic disease. No acute intracranial hemorrhage. No acute large vessel territory infarct. No mass lesion, midline shift or mass effect. No hydrocephalus or extra-axial fluid collection. Vascular: No hyperdense vessel. Scattered vascular calcifications noted within the carotid siphons. Skull: Scalp soft tissues and calvarium within normal limits. Sinuses/Orbits: Globes and orbital soft tissues demonstrate no acute finding. Paranasal sinuses are largely clear. No mastoid effusion. Other: None. IMPRESSION: 1. No acute intracranial abnormality. 2. Generalized age-related cerebral atrophy with mild chronic small vessel ischemic disease. Electronically Signed   By: Jeannine Boga M.D.   On: 06/01/2020 15:54   CT HEAD WO  CONTRAST  Result Date: 05/24/2020 CLINICAL DATA:  Mental status change EXAM: CT HEAD WITHOUT CONTRAST TECHNIQUE: Contiguous axial images were obtained from the base of the skull through the vertex without intravenous contrast. COMPARISON:  05/22/2020 FINDINGS: Brain: There is no acute intracranial hemorrhage,  mass effect, or edema. No new loss of gray-white differentiation. Small chronic left cerebellar infarct. Patchy hypoattenuation in the supratentorial white matter likely reflects stable chronic microvascular ischemic changes. There is no extra-axial fluid collection. Ventricles and sulci are stable in size and configuration. Vascular: There is atherosclerotic calcification at the skull base. Skull: Calvarium is unremarkable. Sinuses/Orbits: Mild mucosal thickening. No acute orbital abnormality. Other: None. IMPRESSION: No acute intracranial abnormality. No significant change since recent prior study. Electronically Signed   By: Macy Mis M.D.   On: 05/24/2020 17:14   CT CHEST WO CONTRAST  Result Date: 05/31/2020 CLINICAL DATA:  Respiratory failure, post thoracentesis, pneumonia, RIGHT shoulder pain EXAM: CT CHEST WITHOUT CONTRAST TECHNIQUE: Multidetector CT imaging of the chest was performed following the standard protocol without IV contrast. Sagittal and coronal MPR images reconstructed from axial data set. COMPARISON:  05/27/2020 FINDINGS: Cardiovascular: Atherosclerotic calcifications aorta, proximal great vessels and coronary arteries. Enlargement of cardiac chambers. Mitral annular calcification. Aorta normal caliber. Minimal pericardial effusion. Mediastinum/Nodes: Large hiatal hernia. Esophagus unremarkable. Base of cervical region normal appearance. Normal sized mediastinal lymph nodes. No thoracic adenopathy. Lungs/Pleura: BILATERAL small pleural effusions. Atelectasis of LEFT lower lobe with compressive atelectasis in BILATERAL upper lobes LEFT greater than RIGHT and in RIGHT lower lobe. No  definite pulmonary mass identified though unable to exclude underlying abnormalities within consolidated/atelectatic LEFT lung. No pneumothorax. Upper Abdomen: Multiple cysts and small calcified nodule at upper pole of LEFT kidney. Musculoskeletal: Chronic displaced fracture at surgical neck RIGHT humerus. Osseous demineralization. IMPRESSION: BILATERAL small pleural effusions with compressive atelectasis of the lungs greatest in LEFT lower lobe. No definite pulmonary mass identified though unable to exclude underlying abnormalities within consolidated/atelectatic LEFT lung. Large hiatal hernia. Extensive atherosclerotic calcifications including coronary arteries. Chronic displaced fracture of the proximal RIGHT humerus. Aortic Atherosclerosis (ICD10-I70.0). Electronically Signed   By: Lavonia Dana M.D.   On: 05/31/2020 15:36   CT CHEST WO CONTRAST  Result Date: 05/27/2020 CLINICAL DATA:  Pneumonia.  Abnormal chest radiograph EXAM: CT CHEST WITHOUT CONTRAST TECHNIQUE: Multidetector CT imaging of the chest was performed following the standard protocol without IV contrast. COMPARISON:  Chest CT 05/18/2020, radiograph 05/27/2020 FINDINGS: Cardiovascular: Valvular calcification. Coronary artery calcification and aortic atherosclerotic calcification. Mediastinum/Nodes: 11 mm RIGHT lower paratracheal lymph node. Lungs/Pleura: Large volume LEFT pleural effusion occupying the near entirety of the LEFT hemithorax. There is passive atelectasis with complete atelectasis of the LEFT lower lobe and lingula. Small volume of LEFT upper lobe is aerated. Small moderate RIGHT pleural effusion with mild passive atelectasis. Upper Abdomen: Limited view of the liver, kidneys, pancreas are unremarkable. Normal adrenal glands. Musculoskeletal: No aggressive osseous lesion. Degenerative osteophytosis of the spine. IMPRESSION: 1. Progressive large LEFT pleural effusion now occupies the near entirety of the LEFT hemithorax. Complete  passive atelectasis of the LEFT lower lobe and lingula. Minimal aeration of the LEFT upper lobe. 2. Small to moderate RIGHT pleural effusion. 3. Coronary artery calcification and Aortic Atherosclerosis (ICD10-I70.0). Electronically Signed   By: Suzy Bouchard M.D.   On: 05/27/2020 18:06   [place] CHEST (PLEURAL EFFUSION)  Result Date: 05/30/2020 CLINICAL DATA:  87 year old female with cough and shortness of breath, recent left thoracentesis. EXAM: CHEST ULTRASOUND COMPARISON:  05/28/2020, 05/29/2020 FINDINGS: Trace bilateral pleural effusions, right greater than left. IMPRESSION: Trace bilateral pleural effusions, right greater than left. No thoracentesis performed. Left-sided opacities on chest radiograph most likely indicative of atelectasis and or infiltrative airspace process. Ruthann Cancer, MD [facility]  Electronically Signed   By: Ruthann Cancer MD   On: 05/30/2020 16:26   DG Chest Port 1 View  Result Date: 05/29/2020 CLINICAL DATA:  87 year old female with cough and progressive shortness of breath. Hypoxia on room air. Recent left side thoracentesis. EXAM: PORTABLE CHEST 1 VIEW COMPARISON:  Portable chest 05/28/2020 and earlier. FINDINGS: Portable AP semi upright view at 1021 hours. Improved lung volumes from yesterday. The patient is less rotated now. Stable cardiac size and mediastinal contours. Left lung ventilation remains improved compared to the CT on 05/27/2020, with residual lower lung opacity which is probably a combination of residual effusion and airspace disease. Right lung appears stable. No pneumothorax. Visualized tracheal air column is within normal limits. Stable visualized osseous structures. Un healed proximal right humerus fracture. Paucity of bowel gas in the upper abdomen. IMPRESSION: 1. Left lung ventilation remains improved since the CT on 05/27/2020 with residual lower lung combined pleural effusion and airspace disease  suspected. 2. No new cardiopulmonary abnormality. Electronically Signed   By: Genevie Ann M.D.   On: 05/29/2020 10:55   DG Chest Port 1 View  Result Date: 05/28/2020 CLINICAL DATA:  Status post left-sided thoracentesis EXAM: PORTABLE CHEST 1 VIEW COMPARISON:  CT chest and chest x-ray obtained yesterday FINDINGS: No evidence of pneumothorax following left-sided thoracentesis. Slightly improved aeration of the left upper lung. Persistent dense opacification of the left mid and lower chest which may represent persistent pleural fluid versus persistent atelectasis. The right lung remains clear. Chronic right proximal humeral fracture again noted. No acute osseous abnormality. Atherosclerotic calcifications present in the transverse aorta. IMPRESSION: 1. No evidence of pneumothorax following left-sided thoracentesis. 2. Improved aeration of the left upper lung but persistent opacification of the left mid and lower lung. Findings may represent residual atelectasis versus residual pleural fluid. Electronically Signed   By: Jacqulynn Cadet M.D.   On: 05/28/2020 11:24   DG Chest Port 1 View  Result Date: 05/27/2020 CLINICAL DATA:  Pneumonia. EXAM: PORTABLE CHEST 1 VIEW COMPARISON:  05/24/2020; 05/22/2020 FINDINGS: Grossly unchanged cardiac silhouette and mediastinal contours with persistent obscuration of the right heart border secondary to unchanged left-sided pleural effusion and associated left mid lower lung consolidative opacities, similar to the 05/22/2020 examination. Atherosclerotic plaque within the aortic arch and mitral valve calcifications. Mild pulmonary venous congestion within the otherwise well aerated right lung. No definite right-sided pleural effusion though the right costophrenic angle is excluded from view. No acute osseous abnormalities. IMPRESSION: 1. Similar appearing extensive left lung consolidative opacities and small left-sided effusion worrisome for multifocal pneumonia. 2. Suspected  pulmonary venous congestion within the otherwise well aerated right lung. Electronically Signed   By: Sandi Mariscal M.D.   On: 05/27/2020 09:30   DG Chest Port 1 View  Result Date: 05/24/2020 CLINICAL DATA:  Post left-sided thoracentesis. EXAM: PORTABLE CHEST 1 VIEW COMPARISON:  Chest radiograph-05/22/2020; 05/18/2020; chest CT-05/18/2020 FINDINGS: Grossly unchanged cardiac silhouette and mediastinal contours with atherosclerotic plaque within thoracic aorta. Dystrophic calcifications within the mitral valve annulus. Interval reduction/near resolution of left-sided pleural effusion post thoracentesis with improved aeration of the left lung apex. Redemonstrated extensive left mid and lower lung heterogeneous/consolidative opacities with associated left-sided volume loss and deviation of the cardiomediastinal structures to the left. Pulmonary vasculature within the right lung remains indistinct. No definite right-sided pleural effusion. No acute osseous abnormalities. Redemonstrated chronic displaced fracture involving the anatomic neck of the right humerus, incompletely evaluated. IMPRESSION: 1. Interval reduction/near resolution of left-sided pleural effusion post thoracentesis. No  pneumothorax. 2. Otherwise, similar findings of extensive left mid and lower lung heterogeneous/consolidative opacities with associated volume loss. Electronically Signed   By: Sandi Mariscal M.D.   On: 05/24/2020 12:15   DG Chest Port 1 View  Result Date: 05/22/2020 CLINICAL DATA:  Hypoxia EXAM: PORTABLE CHEST 1 VIEW COMPARISON:  05/18/2020 FINDINGS: Large left pleural effusion with worsening left upper consolidation. Chronic right humeral head fracture. Mild right chest opacity. IMPRESSION: Large left pleural effusion with worsening left upper consolidation. Electronically Signed   By: Ulyses Jarred M.D.   On: 05/22/2020 01:44   DG Chest Port 1 View  Result Date: 05/18/2020 CLINICAL DATA:  Shortness of breath and fever EXAM:  PORTABLE CHEST 1 VIEW COMPARISON:  March 06, 2020 FINDINGS: There is extensive airspace opacity throughout most of the left lung. The right lung is clear. There is cardiomegaly with mild pulmonary venous hypertension. No adenopathy. There is aortic atherosclerosis. There is calcification in each carotid artery. No bone lesions. IMPRESSION: Widespread airspace opacity throughout much of the left lung consistent with multifocal pneumonia. Right lung clear. There is cardiomegaly with a degree of pulmonary vascular congestion. There is calcification in each carotid artery. Aortic Atherosclerosis (ICD10-I70.0). Electronically Signed   By: Lowella Grip III M.D.   On: 05/18/2020 09:08   CT HEAD CODE STROKE WO CONTRAST`  Result Date: 05/22/2020 CLINICAL DATA:  Code stroke.  Acute neurologic deficit EXAM: CT HEAD WITHOUT CONTRAST TECHNIQUE: Contiguous axial images were obtained from the base of the skull through the vertex without intravenous contrast. COMPARISON:  None. FINDINGS: Brain: There is no mass, hemorrhage or extra-axial collection. The size and configuration of the ventricles and extra-axial CSF spaces are normal. There is hypoattenuation of the periventricular white matter, most commonly indicating chronic ischemic microangiopathy. Vascular: No abnormal hyperdensity of the major intracranial arteries or dural venous sinuses. No intracranial atherosclerosis. Skull: The visualized skull base, calvarium and extracranial soft tissues are normal. Sinuses/Orbits: No fluid levels or advanced mucosal thickening of the visualized paranasal sinuses. No mastoid or middle ear effusion. The orbits are normal. ASPECTS [facility] Stroke Program Early CT Score) - Ganglionic level infarction (caudate, lentiform nuclei, internal capsule, insula, M1-M3 cortex): 7 - Supraganglionic infarction (M4-M6 cortex): 3 Total score (0-10 with 10 being normal): 10 IMPRESSION: 1. Chronic ischemic microangiopathy without acute  intracranial abnormality. 2. ASPECTS is 10. These results were called by telephone at the time of interpretation on 05/22/2020 at 12:35 am to provider [facility] , who verbally acknowledged these results. Electronically Signed   By: Ulyses Jarred M.D.   On: 05/22/2020 00:44   ECHOCARDIOGRAM LIMITED  Result Date: 05/23/2020    ECHOCARDIOGRAM LIMITED REPORT   Patient Name:   MARLEI GLOMSKI Date of Exam: 05/23/2020 Medical Rec #:  [id number]          Height:       62.0 in Accession #:    [id number]         Weight:       181.8 lb Date of Birth:  1928-01-06          BSA:          1.836 m Patient Age:    89 years           BP:           133/36 mmHg Patient Gender: F                  HR:  56 bpm. Exam Location:  [facility] Procedure: Limited Echo, Color Doppler and Cardiac Doppler Indications:     Chest pain R07.9                  NSTEMI I21.4  History:         Patient has prior history of Echocardiogram examinations, most                  recent 02/07/2020. Risk Factors:Hypertension and Diabetes.  Sonographer:     Sherrie Sport RDCS (AE) Referring Phys:  [id number] Arvil Chaco Diagnosing Phys: Kathlyn Sacramento MD IMPRESSIONS  1. Left ventricular ejection fraction, by estimation, is 55 to 60%. The left ventricle has normal function. Left ventricular endocardial border not optimally defined to evaluate regional wall motion. Left ventricular diastolic function could not be evaluated.  2. Right ventricular systolic function is normal. The right ventricular size is normal. Tricuspid regurgitation signal is inadequate for assessing PA pressure.  3. Left atrial size was mildly dilated.  4. Moderate pleural effusion.  5. The mitral valve is abnormal. No evidence of mitral valve regurgitation. No evidence of mitral stenosis. Moderate mitral annular calcification.  6. The aortic valve is normal in structure. Aortic valve regurgitation is not visualized. Mild aortic valve stenosis. No gradients were measured.  7. limited  and challenging study. FINDINGS  Left Ventricle: Left ventricular ejection fraction, by estimation, is 55 to 60%. The left ventricle has normal function. Left ventricular endocardial border not optimally defined to evaluate regional wall motion. The left ventricular internal cavity size was normal in size. There is no left ventricular hypertrophy. Left ventricular diastolic function could not be evaluated. Right Ventricle: The right ventricular size is normal. No increase in right ventricular wall thickness. Right ventricular systolic function is normal. Tricuspid regurgitation signal is inadequate for assessing PA pressure. Left Atrium: Left atrial size was mildly dilated. Right Atrium: Right atrial size was normal in size. Pericardium: There is no evidence of pericardial effusion. Mitral Valve: The mitral valve is abnormal. Moderate mitral annular calcification. No evidence of mitral valve stenosis. Tricuspid Valve: The tricuspid valve is normal in structure. Tricuspid valve regurgitation is not demonstrated. No evidence of tricuspid stenosis. Aortic Valve: The aortic valve is normal in structure. Aortic valve regurgitation is not visualized. Mild aortic stenosis is present. Pulmonic Valve: The pulmonic valve was normal in structure. Pulmonic valve regurgitation is not visualized. No evidence of pulmonic stenosis. Aorta: The aortic root is normal in size and structure. Venous: The inferior vena cava was not well visualized. IAS/Shunts: No atrial level shunt detected by color flow Doppler. Additional Comments: There is a moderate pleural effusion. LEFT VENTRICLE PLAX 2D LVIDd:         3.92 cm LVIDs:         2.74 cm LV PW:         1.54 cm LV IVS:        0.97 cm LVOT diam:     2.00 cm LVOT Area:     3.14 cm  LEFT ATRIUM         Index LA diam:    5.10 cm 2.78 cm/m                        PULMONIC VALVE AORTA                 PV Vmax:        1.12 m/s Ao Root diam: 2.40 cm  PV Peak grad:   5.0 mmHg                        RVOT Peak grad: 12 mmHg   SHUNTS Systemic Diam: 2.00 cm Kathlyn Sacramento MD Electronically signed by Kathlyn Sacramento MD Signature Date/Time: 05/23/2020/10:19:48 AM    Final    CT CHEST ABDOMEN PELVIS WO CONTRAST  Result Date: 05/18/2020 CLINICAL DATA:  Chest pain or shortness of breath. Pleural effusion or pleurisy suspected EXAM: CT CHEST, ABDOMEN AND PELVIS WITHOUT CONTRAST TECHNIQUE: Multidetector CT imaging of the chest, abdomen and pelvis was performed following the standard protocol without IV contrast. COMPARISON:  01/13/2017 FINDINGS: CT CHEST FINDINGS Cardiovascular: Normal heart size. No pericardial effusion. Bulky atherosclerotic calcification of the aorta and great vessels. Coronary atherosclerosis. Mediastinum/Nodes: No adenopathy or mass. Moderate sliding hiatal hernia. Lungs/Pleura: Dense consolidation throughout the left lung with poor visualization of central airways. Small left pleural effusion. Musculoskeletal: Nonacute right humeral neck fracture with marked anterior displacement that is similar to initial diagnosis February 2022 by radiography. Extensive spondylosis and thoracic spine ankylosis. Multilevel degenerative facet spurring especially at the open T6-7 level where there is biforaminal impingement. CT ABDOMEN PELVIS FINDINGS Hepatobiliary: No focal liver abnormality.No evidence of biliary obstruction or stone. Pancreas: Unremarkable. Spleen: Unremarkable. Adrenals/Urinary Tract: Negative adrenals. No hydronephrosis or stone. Left upper pole renal lesion which is a cyst by ultrasound in 2020. Peripherally calcified structure in the left upper pole which was also described on prior. Unremarkable bladder. Stomach/Bowel: No obstruction. No visible bowel inflammation. Numerous distal colonic diverticula. Distal colonic stool with moderate distension of the rectum. No superimposed rectal wall thickening. Vascular/Lymphatic: No acute vascular abnormality. No mass or adenopathy.  Reproductive:Hysterectomy Other: No ascites or pneumoperitoneum. Musculoskeletal: No acute abnormalities. Severe lumbar spine degeneration which is generalized. Generalized osteopenia. Hip osteoarthritis that is particularly advanced on the right. IMPRESSION: 1. Extensive left-sided pneumonia opacifying most of the left lung. Small left parapneumonic effusion. 2. No acute intra-abdominal finding. 3. Numerous chronic/known incidental findings which are described above. Electronically Signed   By: Monte Fantasia M.D.   On: 05/18/2020 11:21   US THORACENTESIS ASP PLEURAL SPACE W/IMG GUIDE  Result Date: 05/28/2020 INDICATION: Patient with history of multifocal pneumonia with left sided pleural effusion. Request is for therapeutic and diagnostic left-sided thoracentesis EXAM: ULTRASOUND GUIDED THERAPEUTIC AND DIAGNOSTIC THORACENTESIS MEDICATIONS: Lidocaine 1% 10 mL COMPLICATIONS: None immediate. PROCEDURE: An ultrasound guided thoracentesis was thoroughly discussed with the patient and questions answered. The benefits, risks, alternatives and complications were also discussed. The patient understands and wishes to proceed with the procedure. Written consent was obtained. Ultrasound was performed to localize and mark an adequate pocket of fluid in the left chest. The area was then prepped and draped in the normal sterile fashion. 1% Lidocaine was used for local anesthesia. Under ultrasound guidance a 6 Fr Safe-T-Centesis catheter was introduced. Thoracentesis was performed. The catheter was removed and a dressing applied. FINDINGS: A total of approximately 600 mL of straw-colored fluid was removed. Samples were sent to the laboratory as requested by the clinical team. IMPRESSION: Successful ultrasound guided therapeutic and diagnostic left-sided thoracentesis yielding 600 mL of pleural fluid. Read by: Rushie Nyhan, NP Electronically Signed   By: Corrie Mckusick D.O.   On: 05/28/2020 11:33   US THORACENTESIS ASP  PLEURAL SPACE W/IMG GUIDE  Result Date: 05/24/2020 INDICATION: Symptomatic left sided pleural effusion. Please perform ultrasound-guided thoracentesis for diagnostic and therapeutic purposes. EXAM: US THORACENTESIS ASP PLEURAL  SPACE W/IMG GUIDE COMPARISON:  Chest radiograph-05/22/2020; 05/18/2020; chest CT-05/18/2020 MEDICATIONS: None. COMPLICATIONS: None immediate. TECHNIQUE: Informed written consent was obtained from the the patient and the patient's daughter after a discussion of the risks, benefits and alternatives to treatment. A timeout was performed prior to the initiation of the procedure. Initial ultrasound scanning demonstrates a small left-sided pleural effusion with dominant component loculated regional to the left upper lobe. The posterior aspect of the left upper/mid chest was prepped and draped in the usual sterile fashion. 1% lidocaine was used for local anesthesia. Under direct ultrasound guidance, an 8 Fr Safe-T-Centesis catheter was introduced. The thoracentesis was performed. The catheter was removed and a dressing was applied. The patient tolerated the procedure well without immediate post procedural complication. The patient was escorted to have an upright chest radiograph. FINDINGS: A total of approximately 250 cc of serous fluid was removed. Requested samples were sent to the laboratory. IMPRESSION: Successful ultrasound-guided left sided thoracentesis yielding 250 cc of pleural fluid. Electronically Signed   By: Sandi Mariscal M.D.   On: 05/24/2020 12:20          ASSESSMENT/PLAN   Acute on chronic hypoxemic respiratory failure -S/p thoracentesis with negative gm stain , neutrophil predominant exudate due to diuresis . -patient significantly clinically improved, looks better then she did in office 4 months ago. -She has CKD and CHF with cardiology following - appreciate input -she has reactive airway disease with asthma and Obesity hypoventilation overlap and does benefit from  BIPAP nocturnally. -She is with severe deconditioning which is precluding her from taking vigorous breath and she should have IS and Chest PT -reviewed CT chest no pneumonia no mass no pneumonthorax no fibrosis, +atelectasis LLL and effusion left side -Reviewed care plan with daughter and patient at bedside -Met with Attending physician and reviewed imaging and plan together.  -repeat thoracentesis - patient feels better - 4/5 - reviewed cxr - improved  -4/6 - repeat thoracentesis - not enough fluid, cancelled  - 4/7 repeat CT chest of residual PNA/atelecatsis at Left base -4/8- there are pleural effusions bilaterally, there is a large amount of atelectasis worse on left.  This will be recurited further with MetaNEB device, I discussed importance of technique with patient. She is tired this am did not sleep well.  We will also diurese more aggressively since IR was unable to perform  repeat thoracentesis.  I have dcd norvasc to potentiate BP and ordered lasix 80iv to diurese better.  She is using IS and working with pt/ot. She is on room air.   4/9-chest x-ray repeated today with interval improvement with compressive atelectasis, plan is to continue MetaNeb with recruitment maneuvers while inpatient.  And continue PT OT with pulmonary rehab if patient is able to do all on outpatient basis.  In terms of therapy she will need outpatient Lasix.  I will stop her DuoNeb therapy at this point due to little efficacy unlikely possible side effects.  Will hold lasix today due to vigourous UOP.  She was a bit slow to speak seems sedated, I will hold tramadol and keppra and monitor.      Thank you for allowing me to participate in the care of this patient.    Patient/Family are satisfied with care plan and all questions have been answered.  This document was prepared using Dragon voice recognition software and may include unintentional dictation errors.     Ottie Glazier, M.D.  Division of Pulmonary &  Critical Care Medicine  [facility]

## 2020-06-03 ENCOUNTER — Inpatient Hospital Stay: Payer: Medicare Other

## 2020-06-03 DIAGNOSIS — J189 Pneumonia, unspecified organism: Secondary | ICD-10-CM | POA: Diagnosis not present

## 2020-06-03 LAB — BASIC METABOLIC PANEL
Anion gap: 12 (ref 5–15)
BUN: 43 mg/dL — ABNORMAL HIGH (ref 8–23)
CO2: 27 mmol/L (ref 22–32)
Calcium: 8.5 mg/dL — ABNORMAL LOW (ref 8.9–10.3)
Chloride: 103 mmol/L (ref 98–111)
Creatinine, Ser: 1.17 mg/dL — ABNORMAL HIGH (ref 0.44–1.00)
GFR, Estimated: 44 mL/min — ABNORMAL LOW (ref >60)
Glucose, Bld: 161 mg/dL — ABNORMAL HIGH (ref 70–99)
Potassium: 4.6 mmol/L (ref 3.5–5.1)
Sodium: 142 mmol/L (ref 135–145)

## 2020-06-03 LAB — BLOOD GAS, VENOUS
Acid-Base Excess: 13.7 mmol/L — ABNORMAL HIGH (ref 0.0–2.0)
Bicarbonate: 37.6 mmol/L — ABNORMAL HIGH (ref 20.0–28.0)
FIO2: 21
O2 Saturation: 92.5 %
Patient temperature: 37
pCO2, Ven: 43 mmHg — ABNORMAL LOW (ref 44.0–60.0)
pH, Ven: 7.55 — ABNORMAL HIGH (ref 7.250–7.430)
pO2, Ven: 56 mmHg — ABNORMAL HIGH (ref 32.0–45.0)

## 2020-06-03 LAB — BLOOD GAS, ARTERIAL
Acid-Base Excess: 15.4 mmol/L — ABNORMAL HIGH (ref 0.0–2.0)
Bicarbonate: 38.3 mmol/L — ABNORMAL HIGH (ref 20.0–28.0)
FIO2: 0.21
O2 Saturation: 95.8 %
Patient temperature: 37
pCO2 arterial: 39 mmHg (ref 32.0–48.0)
pH, Arterial: 7.6 — ABNORMAL HIGH (ref 7.350–7.450)
pO2, Arterial: 66 mmHg — ABNORMAL LOW (ref 83.0–108.0)

## 2020-06-03 LAB — PHOSPHORUS: Phosphorus: 2.4 mg/dL — ABNORMAL LOW (ref 2.5–4.6)

## 2020-06-03 MED ORDER — HALOPERIDOL LACTATE 2 MG/ML PO CONC
0.5000 mg | ORAL | Status: DC | PRN
  Filled 2020-06-03: qty 0.3

## 2020-06-03 MED ORDER — HALOPERIDOL 0.5 MG PO TABS
0.5000 mg | ORAL_TABLET | ORAL | Status: DC | PRN
  Filled 2020-06-03: qty 1

## 2020-06-03 MED ORDER — LORAZEPAM 2 MG/ML IJ SOLN
1.0000 mg | INTRAMUSCULAR | Status: DC | PRN
  Administered 2020-06-03: 20:00:00 1 mg via INTRAVENOUS
  Filled 2020-06-03: qty 1

## 2020-06-03 MED ORDER — HALOPERIDOL LACTATE 5 MG/ML IJ SOLN: 0.5000 mg | INTRAMUSCULAR | Status: DC | PRN

## 2020-06-03 MED ORDER — ONDANSETRON 4 MG PO TBDP
4.0000 mg | ORAL_TABLET | Freq: Four times a day (QID) | ORAL | Status: DC | PRN
Start: 2020-06-03 — End: 2020-06-05
  Filled 2020-06-03: qty 1

## 2020-06-03 MED ORDER — ONDANSETRON HCL 4 MG/2ML IJ SOLN: 4.0000 mg | Freq: Four times a day (QID) | INTRAMUSCULAR | Status: DC | PRN

## 2020-06-03 MED ORDER — MORPHINE SULFATE (PF) 2 MG/ML IV SOLN
2.0000 mg | INTRAVENOUS | Status: DC | PRN
  Administered 2020-06-03 (×2): 2 mg via INTRAVENOUS
  Filled 2020-06-03 (×2): qty 1

## 2020-06-03 MED ORDER — MORPHINE SULFATE (PF) 2 MG/ML IV SOLN
1.0000 mg | INTRAVENOUS | Status: DC | PRN
  Administered 2020-06-03 (×2): 1 mg via INTRAVENOUS
  Filled 2020-06-03 (×2): qty 1

## 2020-06-03 NOTE — Progress Notes (Signed)
   Pulmonary Medicine          Date: 06/03/2020,   MRN# [id number] Amy [name] 06/24/1927     AdmissionWeight: 86.2 kg                 CurrentWeight: 80.4 kg   Referring physician: Dr Weiting    CHIEF COMPLAINT:   Multifocal Pneumonia with parapneumonic effusion.   HISTORY OF PRESENT ILLNESS   As per admission h/p Patient states that she started having abdominal pain which was located in the left side of the abdomen, associated with nausea and diarrhea.  She has had 3-4 times of loose stool bowel movement.  She also had fever of 101 in facility, currently body temperature is 97.5 in ED. Patient has cough and shortness of breath which has been progressively worsening.  She was found to have oxygen desaturation to 85% on room air, which improved to 94% on 5 L oxygen.  Denies chest pain.  No rectal bleeding or dark stool.  Pt was found to have WBC 4.7, lactic acid 2.9, 1.5, INR 1.1, PTT 31, troponin level 638, 933, positive Covid PCR, renal function close to baseline, temperature 97.5, softer blood pressure 93/39, heart rate 98, RR 28.  Chest x-ray showed multifocal infiltration mainly in the left side, vascular congestion and cardiomegaly.  CT of the chest/abdomen/pelvis showed left lower lobe infiltration,  no acute issues intra-abdominally. After 9 day pneumonia tx and thoracentesis patient still has bilateral infiltrates and PCCM consultation placed for additional evaluation and management.   05/28/20- Patient is s/p thoracentesis, she feels better already. She is working with Bambi using Metaneb therapy and feels that after first session she is already breathing better.  She had OT today. We discussed using incentive spiroemetry.  I discussed her care plan with Dr Wieting today.   05/29/20- repeat CXR this am with residual effusion on left and bibasilar atelectasis.  We discussed possibly needing repeat thoracentesis.  She is on room air , daughter in room. I reviwed plan with  family , Respiratory therapist Demetria and Attending physician Dr Wieting. We will focus on continuing medical mgt, placement, PT/OT and outpatient follow up.   05/30/20- patient examined at bedside. She continues to be with SOB and hypoxemia on 2L/min which is new for her. She does still have fluid around left lung and felt most improved post thoracentesis. She used Metaneb and waited another 24h with diuresis but has not been able to improve further. We agreed on repeat thoracentesis today, patient may sign her own consent  05/31/20- patient is improved, she had repeat thoracentesis ordered with minimal effusions noted on US and no tap performed.  She is off nasal canula oxygen now on room air.  She is still with decreased air entry on left which may just be from atelectasis and if that's the case we can initiate DC planning to SNF per PT recommendation.  Will obtain non conrasted CT chest today to further evaluate.   n 06/02/2020- no acute distress.  Diuresis was increased yesterday due to residual pleural effusions with compressive atelectasis, with resultant over 2 L urine output overnight.  Repeat chest x-ray done today reviewed by me with significant improvement noted bilaterally.  She continues to work with physical therapy and respiratory therapy with recruitment maneuvers utilizing MetaNeb device.  Her renal function is actually improving despite more aggressive diuresis.  Fluid from thoracentesis remains simple appearing with technically an exudative profile however this is likely due to   concentration from diuresis.  Overall her condition is improving and should be safe for discharge with continued home health and PT.  Appears there are no beds for SNF placement.   06/03/20- Patient is poorly responsive with reduced GCS, have met with Dr Maylene Roes and there is plan for advancing care to SDU and further discourse with family. Additonal studies ordered  PAST MEDICAL HISTORY   Past Medical History:   Diagnosis Date  . Bladder incontinence   . CAD (coronary artery disease)    a. 05/2009 Cath: LAD 90p (Xience 2.75 x 12 mm DES), 51m D1 40, LCx 40p/m, RCA 30/40/30p/m, RCA 30/25d;  b.  12/2011 Lexiscan MV: no ischemia, breast attenuation artifact, normal EF-->Low risk; c. 10/2016 MV: fixed apical defect, most likely apical thinning and attenuation, No ischemia, EF 60%.  . Cancer ([facility]    ovarian  . Carotid arterial disease w/ R Carotid Bruit (HCC)    a. 08/2016 Carotid U/S: 40-59% bilat ICA stenosis - f/u 1 yr.  . Chronic diastolic (congestive) heart failure ([facility]    a. Echo 09/2015: EF 55-60% w/ Grade 1 DD, sev Ca2+ MV annulus, mildly dil LA; b. 09/2016 Echo: EF 65-70%, Gr2 DD, mildly dil LA/RA, nl RV fxn.  . Chronic Dyspnea on exertion   . CKD (chronic kidney disease) stage 3, GFR 30-59 ml/min (HCC)   . Degenerative arthritis of knee    bilateral knees  . Diabetes mellitus    Type II  . GIB (gastrointestinal bleeding)    a. 08/2016 Admission w/ presyncope/anemia/melena-->req Transfusion-->endo ok, colonoscopy w/ polyps but no source of bleeding (most likely diverticular).  . Hiatal hernia   . Hypertension   . Iron deficiency   . Menopausal symptoms   . Morbid obesity ([facility]   . Renal insufficiency   . Thyroid disease    hypothyroidism     SURGICAL HISTORY   Past Surgical History:  Procedure Laterality Date  . COLONOSCOPY  2015  . COLONOSCOPY WITH PROPOFOL N/A 09/25/2016   Procedure: COLONOSCOPY WITH PROPOFOL;  Surgeon: AJonathon Bellows MD;  [facility];  Service: Gastroenterology;  Laterality: N/A;  . COLONOSCOPY WITH PROPOFOL N/A 09/26/2016   Procedure: COLONOSCOPY WITH PROPOFOL;  Surgeon: AJonathon Bellows MD;  [facility];  Service: Gastroenterology;  Laterality: N/A;  . ESOPHAGOGASTRODUODENOSCOPY (EGD) WITH PROPOFOL N/A 09/25/2016   Procedure: ESOPHAGOGASTRODUODENOSCOPY (EGD) WITH PROPOFOL;  Surgeon: AJonathon Bellows MD;  [facility];  Service:  Gastroenterology;  Laterality: N/A;  . RENAL ANGIOGRAPHY N/A 02/07/2019   Procedure: RENAL ANGIOGRAPHY;  Surgeon: DAlgernon Huxley MD;  [facility];  Service: Cardiovascular;  Laterality: N/A;  . REPLACEMENT TOTAL KNEE BILATERAL    . TOTAL VAGINAL HYSTERECTOMY     ovarian mass, not cancerous  . UPPER GI ENDOSCOPY  2015     FAMILY HISTORY   Family History  Problem Relation Age of Onset  . Breast cancer Mother 837    SOCIAL HISTORY   Social History   Tobacco Use  . Smoking status: Never Smoker  . Smokeless tobacco: Never Used  Vaping Use  . Vaping Use: Never used  Substance Use Topics  . Alcohol use: No  . Drug use: No     MEDICATIONS    Home Medication:    Current Medication:  Current Facility-Administered Medications:  .  acetaminophen (TYLENOL) tablet 650 mg, 650 mg, Oral, Q6H PRN, POswald Hillock RPH, 650 mg at 06/02/20 2321 .  albuterol (PROVENTIL) (2.5 MG/3ML) 0.083% nebulizer  solution 2.5 mg, 2.5 mg, Nebulization, Q4H PRN, Niu, Xilin, MD .  atorvastatin (LIPITOR) tablet 20 mg, 20 mg, Oral, Daily, Patel, Kishan S, RPH, 20 mg at 06/02/20 2144 .  bisacodyl (DULCOLAX) suppository 10 mg, 10 mg, Rectal, QHS, Wieting, Richard, MD, 10 mg at 06/02/20 2144 .  Chlorhexidine Gluconate Cloth 2 % PADS 6 each, 6 each, Topical, Daily, Wieting, Richard, MD, 6 each at 06/02/20 0901 .  clopidogrel (PLAVIX) tablet 75 mg, 75 mg, Oral, Daily, Niu, Xilin, MD, 75 mg at 06/02/20 0843 .  dextromethorphan-guaiFENesin (MUCINEX DM) 30-600 MG per 12 hr tablet 1 tablet, 1 tablet, Oral, BID, Williams, Jamiese M, MD, 1 tablet at 06/02/20 2143 .  docusate sodium (COLACE) capsule 100 mg, 100 mg, Oral, BID PRN, Niu, Xilin, MD, 100 mg at 06/01/20 2008 .  doxazosin (CARDURA) tablet 2 mg, 2 mg, Oral, Q12H, Sreenath, Sudheer B, MD, 2 mg at 06/02/20 2144 .  enoxaparin (LOVENOX) injection 40 mg, 40 mg, Subcutaneous, Q24H, Beers, Brandon D, RPH, 40 mg at 06/02/20 2143 .  hydrALAZINE  (APRESOLINE) injection 10 mg, 10 mg, Intravenous, Q6H PRN, Morrison, Brenda, NP, 10 mg at 06/02/20 2322 .  iron polysaccharides (NIFEREX) capsule 150 mg, 150 mg, Oral, Daily, Niu, Xilin, MD, 150 mg at 06/02/20 0844 .  levothyroxine (SYNTHROID) tablet 100 mcg, 100 mcg, Oral, Q0600, Williams, Jamiese M, MD, 100 mcg at 06/03/20 0500 .  lidocaine (LIDODERM) 5 % 1 patch, 1 patch, Transdermal, Q24H, Sreenath, Sudheer B, MD, 1 patch at 06/02/20 1543 .  magnesium oxide (MAG-OX) tablet 400 mg, 400 mg, Oral, Daily, Wieting, Richard, MD, 400 mg at 06/02/20 0843 .  mometasone-formoterol (DULERA) 200-5 MCG/ACT inhaler 2 puff, 2 puff, Inhalation, BID, Niu, Xilin, MD, 2 puff at 06/02/20 2140 .  nitroGLYCERIN (NITROSTAT) SL tablet 0.4 mg, 0.4 mg, Sublingual, Q5 Min x 3 PRN, Niu, Xilin, MD .  ondansetron (ZOFRAN) injection 4 mg, 4 mg, Intravenous, Q8H PRN, Niu, Xilin, MD .  pantoprazole (PROTONIX) EC tablet 20 mg, 20 mg, Oral, Daily, Patel, Kishan S, RPH, 20 mg at 06/02/20 0844 .  polyethylene glycol (MIRALAX / GLYCOLAX) packet 17 g, 17 g, Oral, Daily PRN, Niu, Xilin, MD .  predniSONE (DELTASONE) tablet 10 mg, 10 mg, Oral, Q breakfast, Choi, Jennifer, DO, 10 mg at 06/02/20 0844 .  simethicone (MYLICON) chewable tablet 160 mg, 160 mg, Oral, QID PRN, Morrison, Brenda, NP .  sodium chloride flush (NS) 0.9 % injection 10-40 mL, 10-40 mL, Intracatheter, Q12H, Gollan, Timothy J, MD, 10 mL at 06/02/20 2141 .  sodium chloride flush (NS) 0.9 % injection 10-40 mL, 10-40 mL, Intracatheter, PRN, Gollan, Timothy J, MD, 10 mL at 06/02/20 0848 .  sodium phosphate (FLEET) 7-19 GM/118ML enema 1 enema, 1 enema, Rectal, Daily, Wieting, Richard, MD, 1 enema at 05/25/20 0901  Facility-Administered Medications Ordered in Other Encounters:  .  0.9 %  sodium chloride infusion, , Intravenous, Continuous, Finnegan, Timothy J, MD    ALLERGIES   Atenolol, Codeine, Losartan, Morphine and related, Fentanyl, Nsaids, Rofecoxib, Sucralfate,  Sulfa antibiotics, and Tramadol     REVIEW OF SYSTEMS    Review of Systems:  Gen:  Denies  fever, sweats, chills weigh loss  HEENT: Denies blurred vision, double vision, ear pain, eye pain, hearing loss, nose bleeds, sore throat Cardiac:  No dizziness, chest pain or heaviness, chest tightness,edema Resp:   Denies cough or sputum porduction, shortness of breath,wheezing, hemoptysis,  Gi: Denies swallowing difficulty, stomach pain, nausea or vomiting, diarrhea, constipation, bowel incontinence   Gu:  Denies bladder incontinence, burning urine Ext:   Denies Joint pain, stiffness or swelling Skin: Denies  skin rash, easy bruising or bleeding or hives Endoc:  Denies polyuria, polydipsia , polyphagia or weight change Psych:   Denies depression, insomnia or hallucinations   Other:  All other systems negative   VS: BP 126/86 (BP Location: Left Arm)   Pulse 90   Temp 97.7 F (36.5 C) (Oral)   Resp 20   Ht 5' 2" (1.575 m)   Wt 80.4 kg   SpO2 91%   BMI 32.41 kg/m      PHYSICAL EXAM    GENERAL:NAD, no fevers, chills, no weakness no fatigue HEAD: Normocephalic, atraumatic.  EYES: Pupils equal, round, reactive to light. Extraocular muscles intact. No scleral icterus.  MOUTH: Moist mucosal membrane. Dentition intact. No abscess noted.  EAR, NOSE, THROAT: Clear without exudates. No external lesions.  NECK: Supple. No thyromegaly. No nodules. No JVD.  PULMONARY: decreased breath sounds on left  CARDIOVASCULAR: S1 and S2. Regular rate and rhythm. No murmurs, rubs, or gallops. No edema. Pedal pulses 2+ bilaterally.  GASTROINTESTINAL: Soft, nontender, nondistended. No masses. Positive bowel sounds. No hepatosplenomegaly.  MUSCULOSKELETAL: No swelling, clubbing, or edema. Range of motion full in all extremities.  NEUROLOGIC: Cranial nerves II through XII are intact. No gross focal neurological deficits. Sensation intact. Reflexes intact.  SKIN: No ulceration, lesions, rashes, or cyanosis.  Skin warm and dry. Turgor intact.  PSYCHIATRIC: Mood, affect within normal limits. The patient is awake, alert and oriented x 3. Insight, judgment intact.       IMAGING    CT HEAD WO CONTRAST  Result Date: 06/01/2020 CLINICAL DATA:  Initial evaluation for acute neuro deficit, stroke suspected, left facial droop. EXAM: CT HEAD WITHOUT CONTRAST TECHNIQUE: Contiguous axial images were obtained from the base of the skull through the vertex without intravenous contrast. COMPARISON:  Prior head CT from 05/24/2020. FINDINGS: Brain: Generalized age-related cerebral atrophy with mild chronic small vessel ischemic disease. No acute intracranial hemorrhage. No acute large vessel territory infarct. No mass lesion, midline shift or mass effect. No hydrocephalus or extra-axial fluid collection. Vascular: No hyperdense vessel. Scattered vascular calcifications noted within the carotid siphons. Skull: Scalp soft tissues and calvarium within normal limits. Sinuses/Orbits: Globes and orbital soft tissues demonstrate no acute finding. Paranasal sinuses are largely clear. No mastoid effusion. Other: None. IMPRESSION: 1. No acute intracranial abnormality. 2. Generalized age-related cerebral atrophy with mild chronic small vessel ischemic disease. Electronically Signed   By: Benjamin  McClintock M.D.   On: 06/01/2020 15:54   CT HEAD WO CONTRAST  Result Date: 05/24/2020 CLINICAL DATA:  Mental status change EXAM: CT HEAD WITHOUT CONTRAST TECHNIQUE: Contiguous axial images were obtained from the base of the skull through the vertex without intravenous contrast. COMPARISON:  05/22/2020 FINDINGS: Brain: There is no acute intracranial hemorrhage, mass effect, or edema. No new loss of gray-white differentiation. Small chronic left cerebellar infarct. Patchy hypoattenuation in the supratentorial white matter likely reflects stable chronic microvascular ischemic changes. There is no extra-axial fluid collection. Ventricles and sulci  are stable in size and configuration. Vascular: There is atherosclerotic calcification at the skull base. Skull: Calvarium is unremarkable. Sinuses/Orbits: Mild mucosal thickening. No acute orbital abnormality. Other: None. IMPRESSION: No acute intracranial abnormality. No significant change since recent prior study. Electronically Signed   By: Praneil  Patel M.D.   On: 05/24/2020 17:14   CT CHEST WO CONTRAST  Result Date: 05/31/2020 CLINICAL DATA:  Respiratory failure, post   thoracentesis, pneumonia, RIGHT shoulder pain EXAM: CT CHEST WITHOUT CONTRAST TECHNIQUE: Multidetector CT imaging of the chest was performed following the standard protocol without IV contrast. Sagittal and coronal MPR images reconstructed from axial data set. COMPARISON:  05/27/2020 FINDINGS: Cardiovascular: Atherosclerotic calcifications aorta, proximal great vessels and coronary arteries. Enlargement of cardiac chambers. Mitral annular calcification. Aorta normal caliber. Minimal pericardial effusion. Mediastinum/Nodes: Large hiatal hernia. Esophagus unremarkable. Base of cervical region normal appearance. Normal sized mediastinal lymph nodes. No thoracic adenopathy. Lungs/Pleura: BILATERAL small pleural effusions. Atelectasis of LEFT lower lobe with compressive atelectasis in BILATERAL upper lobes LEFT greater than RIGHT and in RIGHT lower lobe. No definite pulmonary mass identified though unable to exclude underlying abnormalities within consolidated/atelectatic LEFT lung. No pneumothorax. Upper Abdomen: Multiple cysts and small calcified nodule at upper pole of LEFT kidney. Musculoskeletal: Chronic displaced fracture at surgical neck RIGHT humerus. Osseous demineralization. IMPRESSION: BILATERAL small pleural effusions with compressive atelectasis of the lungs greatest in LEFT lower lobe. No definite pulmonary mass identified though unable to exclude underlying abnormalities within consolidated/atelectatic LEFT lung. Large hiatal  hernia. Extensive atherosclerotic calcifications including coronary arteries. Chronic displaced fracture of the proximal RIGHT humerus. Aortic Atherosclerosis (ICD10-I70.0). Electronically Signed   By: Mark  Boles M.D.   On: 05/31/2020 15:36   CT CHEST WO CONTRAST  Result Date: 05/27/2020 CLINICAL DATA:  Pneumonia.  Abnormal chest radiograph EXAM: CT CHEST WITHOUT CONTRAST TECHNIQUE: Multidetector CT imaging of the chest was performed following the standard protocol without IV contrast. COMPARISON:  Chest CT 05/18/2020, radiograph 05/27/2020 FINDINGS: Cardiovascular: Valvular calcification. Coronary artery calcification and aortic atherosclerotic calcification. Mediastinum/Nodes: 11 mm RIGHT lower paratracheal lymph node. Lungs/Pleura: Large volume LEFT pleural effusion occupying the near entirety of the LEFT hemithorax. There is passive atelectasis with complete atelectasis of the LEFT lower lobe and lingula. Small volume of LEFT upper lobe is aerated. Small moderate RIGHT pleural effusion with mild passive atelectasis. Upper Abdomen: Limited view of the liver, kidneys, pancreas are unremarkable. Normal adrenal glands. Musculoskeletal: No aggressive osseous lesion. Degenerative osteophytosis of the spine. IMPRESSION: 1. Progressive large LEFT pleural effusion now occupies the near entirety of the LEFT hemithorax. Complete passive atelectasis of the LEFT lower lobe and lingula. Minimal aeration of the LEFT upper lobe. 2. Small to moderate RIGHT pleural effusion. 3. Coronary artery calcification and Aortic Atherosclerosis (ICD10-I70.0). Electronically Signed   By: Stewart  Edmunds M.D.   On: 05/27/2020 18:06   US CHEST (PLEURAL EFFUSION)  Result Date: 05/30/2020 CLINICAL DATA:  87-year-old female with cough and shortness of breath, recent left thoracentesis. EXAM: CHEST ULTRASOUND COMPARISON:  05/28/2020, 05/29/2020 FINDINGS: Trace bilateral pleural effusions, right greater than left. IMPRESSION: Trace  bilateral pleural effusions, right greater than left. No thoracentesis performed. Left-sided opacities on chest radiograph most likely indicative of atelectasis and or infiltrative airspace process. Dylan Suttle, MD [facility] Electronically Signed   By: Dylan  Suttle MD   On: 05/30/2020 16:26   DG Chest Port 1 View  Result Date: 06/02/2020 CLINICAL DATA:  87-year-old female with history of acute respiratory failure with hypoxia. Community-acquired pneumonia. EXAM: PORTABLE CHEST 1 VIEW COMPARISON:  Chest x-ray 05/29/2020. FINDINGS: Large left pleural effusion. Opacity in the left mid to lower hemithorax compatible with underlying atelectasis and/or consolidation. Ill-defined opacity in the right base worsened compared to the prior study, partially obscuring the right hemidiaphragm, concerning for developing consolidation in the right lower lobe and likely right middle lobe. No pneumothorax. No evidence of pulmonary edema. Cardiac silhouette   is largely obscured. Upper mediastinal contours are within normal limits allowing for patient rotation to the left. Atherosclerotic calcifications in the thoracic aorta. Severe calcifications of the mitral annulus. Old comminuted fracture of the right proximal humerus with chronic nonunion. IMPRESSION: 1. Worsening severe multilobar bilateral pneumonia with large left parapneumonic pleural effusion. 2. Aortic atherosclerosis. Electronically Signed   By: Daniel  Entrikin M.D.   On: 06/02/2020 09:52   DG Chest Port 1 View  Result Date: 05/29/2020 CLINICAL DATA:  87-year-old female with cough and progressive shortness of breath. Hypoxia on room air. Recent left side thoracentesis. EXAM: PORTABLE CHEST 1 VIEW COMPARISON:  Portable chest 05/28/2020 and earlier. FINDINGS: Portable AP semi upright view at 1021 hours. Improved lung volumes from yesterday. The patient is less rotated now. Stable cardiac size and  mediastinal contours. Left lung ventilation remains improved compared to the CT on 05/27/2020, with residual lower lung opacity which is probably a combination of residual effusion and airspace disease. Right lung appears stable. No pneumothorax. Visualized tracheal air column is within normal limits. Stable visualized osseous structures. Un healed proximal right humerus fracture. Paucity of bowel gas in the upper abdomen. IMPRESSION: 1. Left lung ventilation remains improved since the CT on 05/27/2020 with residual lower lung combined pleural effusion and airspace disease suspected. 2. No new cardiopulmonary abnormality. Electronically Signed   By: H  Hall M.D.   On: 05/29/2020 10:55   DG Chest Port 1 View  Result Date: 05/28/2020 CLINICAL DATA:  Status post left-sided thoracentesis EXAM: PORTABLE CHEST 1 VIEW COMPARISON:  CT chest and chest x-ray obtained yesterday FINDINGS: No evidence of pneumothorax following left-sided thoracentesis. Slightly improved aeration of the left upper lung. Persistent dense opacification of the left mid and lower chest which may represent persistent pleural fluid versus persistent atelectasis. The right lung remains clear. Chronic right proximal humeral fracture again noted. No acute osseous abnormality. Atherosclerotic calcifications present in the transverse aorta. IMPRESSION: 1. No evidence of pneumothorax following left-sided thoracentesis. 2. Improved aeration of the left upper lung but persistent opacification of the left mid and lower lung. Findings may represent residual atelectasis versus residual pleural fluid. Electronically Signed   By: Heath  McCullough M.D.   On: 05/28/2020 11:24   DG Chest Port 1 View  Result Date: 05/27/2020 CLINICAL DATA:  Pneumonia. EXAM: PORTABLE CHEST 1 VIEW COMPARISON:  05/24/2020; 05/22/2020 FINDINGS: Grossly unchanged cardiac silhouette and mediastinal contours with persistent obscuration of the right heart border secondary to unchanged  left-sided pleural effusion and associated left mid lower lung consolidative opacities, similar to the 05/22/2020 examination. Atherosclerotic plaque within the aortic arch and mitral valve calcifications. Mild pulmonary venous congestion within the otherwise well aerated right lung. No definite right-sided pleural effusion though the right costophrenic angle is excluded from view. No acute osseous abnormalities. IMPRESSION: 1. Similar appearing extensive left lung consolidative opacities and small left-sided effusion worrisome for multifocal pneumonia. 2. Suspected pulmonary venous congestion within the otherwise well aerated right lung. Electronically Signed   By: John  Watts M.D.   On: 05/27/2020 09:30   DG Chest Port 1 View  Result Date: 05/24/2020 CLINICAL DATA:  Post left-sided thoracentesis. EXAM: PORTABLE CHEST 1 VIEW COMPARISON:  Chest radiograph-05/22/2020; 05/18/2020; chest CT-05/18/2020 FINDINGS: Grossly unchanged cardiac silhouette and mediastinal contours with atherosclerotic plaque within thoracic aorta. Dystrophic calcifications within the mitral valve annulus. Interval reduction/near resolution of left-sided pleural effusion post thoracentesis with improved aeration of the left lung apex. Redemonstrated extensive left mid and lower lung heterogeneous/consolidative opacities with   associated left-sided volume loss and deviation of the cardiomediastinal structures to the left. Pulmonary vasculature within the right lung remains indistinct. No definite right-sided pleural effusion. No acute osseous abnormalities. Redemonstrated chronic displaced fracture involving the anatomic neck of the right humerus, incompletely evaluated. IMPRESSION: 1. Interval reduction/near resolution of left-sided pleural effusion post thoracentesis. No pneumothorax. 2. Otherwise, similar findings of extensive left mid and lower lung heterogeneous/consolidative opacities with associated volume loss. Electronically Signed    By: Sandi Mariscal M.D.   On: 05/24/2020 12:15   DG Chest Port 1 View  Result Date: 05/22/2020 CLINICAL DATA:  Hypoxia EXAM: PORTABLE CHEST 1 VIEW COMPARISON:  05/18/2020 FINDINGS: Large left pleural effusion with worsening left upper consolidation. Chronic right humeral head fracture. Mild right chest opacity. IMPRESSION: Large left pleural effusion with worsening left upper consolidation. Electronically Signed   By: Ulyses Jarred M.D.   On: 05/22/2020 01:44   DG Chest Port 1 View  Result Date: 05/18/2020 CLINICAL DATA:  Shortness of breath and fever EXAM: PORTABLE CHEST 1 VIEW COMPARISON:  March 06, 2020 FINDINGS: There is extensive airspace opacity throughout most of the left lung. The right lung is clear. There is cardiomegaly with mild pulmonary venous hypertension. No adenopathy. There is aortic atherosclerosis. There is calcification in each carotid artery. No bone lesions. IMPRESSION: Widespread airspace opacity throughout much of the left lung consistent with multifocal pneumonia. Right lung clear. There is cardiomegaly with a degree of pulmonary vascular congestion. There is calcification in each carotid artery. Aortic Atherosclerosis (ICD10-I70.0). Electronically Signed   By: Lowella Grip III M.D.   On: 05/18/2020 09:08   CT HEAD CODE STROKE WO CONTRAST`  Result Date: 05/22/2020 CLINICAL DATA:  Code stroke.  Acute neurologic deficit EXAM: CT HEAD WITHOUT CONTRAST TECHNIQUE: Contiguous axial images were obtained from the base of the skull through the vertex without intravenous contrast. COMPARISON:  None. FINDINGS: Brain: There is no mass, hemorrhage or extra-axial collection. The size and configuration of the ventricles and extra-axial CSF spaces are normal. There is hypoattenuation of the periventricular white matter, most commonly indicating chronic ischemic microangiopathy. Vascular: No abnormal hyperdensity of the major intracranial arteries or dural venous sinuses. No intracranial  atherosclerosis. Skull: The visualized skull base, calvarium and extracranial soft tissues are normal. Sinuses/Orbits: No fluid levels or advanced mucosal thickening of the visualized paranasal sinuses. No mastoid or middle ear effusion. The orbits are normal. ASPECTS [facility] Stroke Program Early CT Score) - Ganglionic level infarction (caudate, lentiform nuclei, internal capsule, insula, M1-M3 cortex): 7 - Supraganglionic infarction (M4-M6 cortex): 3 Total score (0-10 with 10 being normal): 10 IMPRESSION: 1. Chronic ischemic microangiopathy without acute intracranial abnormality. 2. ASPECTS is 10. These results were called by telephone at the time of interpretation on 05/22/2020 at 12:35 am to provider [facility] , who verbally acknowledged these results. Electronically Signed   By: Ulyses Jarred M.D.   On: 05/22/2020 00:44   ECHOCARDIOGRAM LIMITED  Result Date: 05/23/2020    ECHOCARDIOGRAM LIMITED REPORT   Patient Name:   Amy [name] Date of Exam: 05/23/2020 Medical Rec #:  [id number]          Height:       62.0 in Accession #:    [id number]         Weight:       181.8 lb Date of Birth:  [birth year]          BSA:          1.836 m Patient  Age:    76 years           BP:           133/36 mmHg Patient Gender: F                  HR:           56 bpm. Exam Location:  [facility] Procedure: Limited Echo, Color Doppler and Cardiac Doppler Indications:     Chest pain R07.9                  NSTEMI I21.4  History:         Patient has prior history of Echocardiogram examinations, most                  recent 02/07/2020. Risk Factors:Hypertension and Diabetes.  Sonographer:     Sherrie Sport RDCS (AE) Referring Phys:  [id number] Arvil Chaco Diagnosing Phys: Kathlyn Sacramento MD IMPRESSIONS  1. Left ventricular ejection fraction, by estimation, is 55 to 60%. The left ventricle has normal function. Left ventricular endocardial border not optimally defined to evaluate regional wall motion. Left ventricular diastolic function  could not be evaluated.  2. Right ventricular systolic function is normal. The right ventricular size is normal. Tricuspid regurgitation signal is inadequate for assessing PA pressure.  3. Left atrial size was mildly dilated.  4. Moderate pleural effusion.  5. The mitral valve is abnormal. No evidence of mitral valve regurgitation. No evidence of mitral stenosis. Moderate mitral annular calcification.  6. The aortic valve is normal in structure. Aortic valve regurgitation is not visualized. Mild aortic valve stenosis. No gradients were measured.  7. limited and challenging study. FINDINGS  Left Ventricle: Left ventricular ejection fraction, by estimation, is 55 to 60%. The left ventricle has normal function. Left ventricular endocardial border not optimally defined to evaluate regional wall motion. The left ventricular internal cavity size was normal in size. There is no left ventricular hypertrophy. Left ventricular diastolic function could not be evaluated. Right Ventricle: The right ventricular size is normal. No increase in right ventricular wall thickness. Right ventricular systolic function is normal. Tricuspid regurgitation signal is inadequate for assessing PA pressure. Left Atrium: Left atrial size was mildly dilated. Right Atrium: Right atrial size was normal in size. Pericardium: There is no evidence of pericardial effusion. Mitral Valve: The mitral valve is abnormal. Moderate mitral annular calcification. No evidence of mitral valve stenosis. Tricuspid Valve: The tricuspid valve is normal in structure. Tricuspid valve regurgitation is not demonstrated. No evidence of tricuspid stenosis. Aortic Valve: The aortic valve is normal in structure. Aortic valve regurgitation is not visualized. Mild aortic stenosis is present. Pulmonic Valve: The pulmonic valve was normal in structure. Pulmonic valve regurgitation is not visualized. No evidence of pulmonic stenosis. Aorta: The aortic root is normal in size and  structure. Venous: The inferior vena cava was not well visualized. IAS/Shunts: No atrial level shunt detected by color flow Doppler. Additional Comments: There is a moderate pleural effusion. LEFT VENTRICLE PLAX 2D LVIDd:         3.92 cm LVIDs:         2.74 cm LV PW:         1.54 cm LV IVS:        0.97 cm LVOT diam:     2.00 cm LVOT Area:     3.14 cm  LEFT ATRIUM         Index LA diam:    5.10  cm 2.78 cm/m                        PULMONIC VALVE AORTA                 PV Vmax:        1.12 m/s Ao Root diam: 2.40 cm PV Peak grad:   5.0 mmHg                       RVOT Peak grad: 12 mmHg   SHUNTS Systemic Diam: 2.00 cm Muhammad Arida MD Electronically signed by Muhammad Arida MD Signature Date/Time: 05/23/2020/10:19:48 AM    Final    CT CHEST ABDOMEN PELVIS WO CONTRAST  Result Date: 05/18/2020 CLINICAL DATA:  Chest pain or shortness of breath. Pleural effusion or pleurisy suspected EXAM: CT CHEST, ABDOMEN AND PELVIS WITHOUT CONTRAST TECHNIQUE: Multidetector CT imaging of the chest, abdomen and pelvis was performed following the standard protocol without IV contrast. COMPARISON:  01/13/2017 FINDINGS: CT CHEST FINDINGS Cardiovascular: Normal heart size. No pericardial effusion. Bulky atherosclerotic calcification of the aorta and great vessels. Coronary atherosclerosis. Mediastinum/Nodes: No adenopathy or mass. Moderate sliding hiatal hernia. Lungs/Pleura: Dense consolidation throughout the left lung with poor visualization of central airways. Small left pleural effusion. Musculoskeletal: Nonacute right humeral neck fracture with marked anterior displacement that is similar to initial diagnosis February 2022 by radiography. Extensive spondylosis and thoracic spine ankylosis. Multilevel degenerative facet spurring especially at the open T6-7 level where there is biforaminal impingement. CT ABDOMEN PELVIS FINDINGS Hepatobiliary: No focal liver abnormality.No evidence of biliary obstruction or stone. Pancreas:  Unremarkable. Spleen: Unremarkable. Adrenals/Urinary Tract: Negative adrenals. No hydronephrosis or stone. Left upper pole renal lesion which is a cyst by ultrasound in 2020. Peripherally calcified structure in the left upper pole which was also described on prior. Unremarkable bladder. Stomach/Bowel: No obstruction. No visible bowel inflammation. Numerous distal colonic diverticula. Distal colonic stool with moderate distension of the rectum. No superimposed rectal wall thickening. Vascular/Lymphatic: No acute vascular abnormality. No mass or adenopathy. Reproductive:Hysterectomy Other: No ascites or pneumoperitoneum. Musculoskeletal: No acute abnormalities. Severe lumbar spine degeneration which is generalized. Generalized osteopenia. Hip osteoarthritis that is particularly advanced on the right. IMPRESSION: 1. Extensive left-sided pneumonia opacifying most of the left lung. Small left parapneumonic effusion. 2. No acute intra-abdominal finding. 3. Numerous chronic/known incidental findings which are described above. Electronically Signed   By: Jonathon  Watts M.D.   On: 05/18/2020 11:21   US THORACENTESIS ASP PLEURAL SPACE W/IMG GUIDE  Result Date: 05/28/2020 INDICATION: Patient with history of multifocal pneumonia with left sided pleural effusion. Request is for therapeutic and diagnostic left-sided thoracentesis EXAM: ULTRASOUND GUIDED THERAPEUTIC AND DIAGNOSTIC THORACENTESIS MEDICATIONS: Lidocaine 1% 10 mL COMPLICATIONS: None immediate. PROCEDURE: An ultrasound guided thoracentesis was thoroughly discussed with the patient and questions answered. The benefits, risks, alternatives and complications were also discussed. The patient understands and wishes to proceed with the procedure. Written consent was obtained. Ultrasound was performed to localize and mark an adequate pocket of fluid in the left chest. The area was then prepped and draped in the normal sterile fashion. 1% Lidocaine was used for local  anesthesia. Under ultrasound guidance a 6 Fr Safe-T-Centesis catheter was introduced. Thoracentesis was performed. The catheter was removed and a dressing applied. FINDINGS: A total of approximately 600 mL of straw-colored fluid was removed. Samples were sent to the laboratory as requested by the clinical team. IMPRESSION: Successful ultrasound guided therapeutic and diagnostic   left-sided thoracentesis yielding 600 mL of pleural fluid. Read by: Jennifer Omohundro, NP Electronically Signed   By: Jaime  Wagner D.O.   On: 05/28/2020 11:33   US THORACENTESIS ASP PLEURAL SPACE W/IMG GUIDE  Result Date: 05/24/2020 INDICATION: Symptomatic left sided pleural effusion. Please perform ultrasound-guided thoracentesis for diagnostic and therapeutic purposes. EXAM: US THORACENTESIS ASP PLEURAL SPACE W/IMG GUIDE COMPARISON:  Chest radiograph-05/22/2020; 05/18/2020; chest CT-05/18/2020 MEDICATIONS: None. COMPLICATIONS: None immediate. TECHNIQUE: Informed written consent was obtained from the the patient and the patient's daughter after a discussion of the risks, benefits and alternatives to treatment. A timeout was performed prior to the initiation of the procedure. Initial ultrasound scanning demonstrates a small left-sided pleural effusion with dominant component loculated regional to the left upper lobe. The posterior aspect of the left upper/mid chest was prepped and draped in the usual sterile fashion. 1% lidocaine was used for local anesthesia. Under direct ultrasound guidance, an 8 Fr Safe-T-Centesis catheter was introduced. The thoracentesis was performed. The catheter was removed and a dressing was applied. The patient tolerated the procedure well without immediate post procedural complication. The patient was escorted to have an upright chest radiograph. FINDINGS: A total of approximately 250 cc of serous fluid was removed. Requested samples were sent to the laboratory. IMPRESSION: Successful ultrasound-guided left  sided thoracentesis yielding 250 cc of pleural fluid. Electronically Signed   By: John  Watts M.D.   On: 05/24/2020 12:20          ASSESSMENT/PLAN   Acute on chronic hypoxemic respiratory failure -S/p thoracentesis with negative gm stain , neutrophil predominant exudate due to diuresis . -patient significantly clinically improved, looks better then she did in office 4 months ago. -She has CKD and CHF with cardiology following - appreciate input -she has reactive airway disease with asthma and Obesity hypoventilation overlap and does benefit from BIPAP nocturnally. -She is with severe deconditioning which is precluding her from taking vigorous breath and she should have IS and Chest PT -reviewed CT chest no pneumonia no mass no pneumonthorax no fibrosis, +atelectasis LLL and effusion left side -Reviewed care plan with daughter and patient at bedside -Met with Attending physician and reviewed imaging and plan together.  -repeat thoracentesis - patient feels better - 4/5 - reviewed cxr - improved  -4/6 - repeat thoracentesis - not enough fluid, cancelled  - 4/7 repeat CT chest of residual PNA/atelecatsis at Left base -4/8- there are pleural effusions bilaterally, there is a large amount of atelectasis worse on left.  This will be recurited further with MetaNEB device, I discussed importance of technique with patient. She is tired this am did not sleep well.  We will also diurese more aggressively since IR was unable to perform  repeat thoracentesis.  I have dcd norvasc to potentiate BP and ordered lasix 80iv to diurese better.  She is using IS and working with pt/ot. She is on room air.   4/9-chest x-ray repeated today with interval improvement with compressive atelectasis, plan is to continue MetaNeb with recruitment maneuvers while inpatient.  And continue PT OT with pulmonary rehab if patient is able to do all on outpatient basis.  In terms of therapy she will need outpatient Lasix.  I will  stop her DuoNeb therapy at this point due to little efficacy unlikely possible side effects.  Will hold lasix today due to vigourous UOP.  She was a bit slow to speak seems sedated, I will hold tramadol and keppra and monitor.      Thank   you for allowing me to participate in the care of this patient.    Patient/Family are satisfied with care plan and all questions have been answered.  This document was prepared using Dragon voice recognition software and may include unintentional dictation errors.      , M.D.  Division of Pulmonary & Critical Care Medicine  [facility]           

## 2020-06-03 NOTE — TOC Progression Note (Signed)
Transition of Care [facility]) - Progression Note    Patient Details  Name: Amy [name] MRN: [id number] Date of Birth: 08-23-1927  Transition of Care [facility]) CM/SW Contact  Izola Price, RN Phone Number: 06/03/2020, 2:24 PM  Clinical Narrative:  Patient family has now decided on inpatient hospice after speaking with home hospice regarding patient's end of life care needs/pain control. Paperwork faxed to [facility] at [facility] Pending new Covid test. Patient has been positive since March 06, 2020 test. If approved bed not available today, most likely Monday or Tuesday at earliest. Will relay information to daughter.  Simmie Davies RN [facility] [id number] 06/03/20 [phone]          Expected Discharge Plan and Services                                                 Social Determinants of Health (SDOH) Interventions    Readmission Risk Interventions Readmission Risk Prevention Plan 08/23/2019 08/15/2019  Transportation Screening Complete Complete  Medication Review [employer]) Complete Complete  PCP or Specialist appointment within 3-5 days of discharge Complete Complete  HRI or Home Care Consult Complete Complete  SW Recovery Care/Counseling Consult Complete Complete  Palliative Care Screening Not Applicable Not Applicable  Skilled Nursing Facility Complete Not Applicable  Some recent data might be hidden

## 2020-06-03 NOTE — TOC Progression Note (Signed)
Transition of Care [facility]) - Progression Note    Patient Details  Name: Amy [name] MRN: [id number] Date of Birth: 1927-10-03  [facility] CM/SW Contact  Izola Price, RN Phone Number: 06/03/2020, 2:55 PM  Clinical Narrative:  Updated daughter on hospice approval process. She appreciated update. Simmie Davies RN CM      Barriers to Discharge: Hospice Bed not available,Other (comment) (Pending Hospice Acceptance for Inpatient Hospice.)  Expected Discharge Plan and Services                                       [facility] Agency: [facility]         Social Determinants of Health (SDOH) Interventions    Readmission Risk Interventions Readmission Risk Prevention Plan 08/23/2019 08/15/2019  Transportation Screening Complete Complete  Medication Review [employer] photographer) Complete Complete  PCP or Specialist appointment within 3-5 days of discharge Complete Complete  HRI or Home Care Consult Complete Complete  SW Recovery Care/Counseling Consult Complete Complete  Palliative Care Screening Not Applicable Not [place] Complete Not Applicable  Some recent data might be hidden

## 2020-06-03 NOTE — Progress Notes (Signed)
PROGRESS NOTE    Amy [name]  NGE:[id number] DOB: [birth year] DOA: 05/18/2020 PCP: Alvester Morin, MD     Brief Narrative:  Patient was admitted on 05/18/2020 with shortness of breath and abdominal pain. Past medical history of hypertension, hyperlipidemia, type 2 diabetes, COPD, CAD, hypothyroidism, atrial fibrillation, seizure, chronic kidney disease, anemia, CHF. She was found to have clinical sepsis with multifocal pneumonia and left pleural effusion. The patient has received 10 days of antibiotics. She had a thoracentesis on 05/24/2020 removing 250 mL of fluid. Another thoracentesis on 05/28/2020 removing 600 mL of fluid. Nothing growing on a either culture. Follow-up cytology of the second thoracentesis. Repeat chest x-ray little bit better than previous x-rays. Pulmonary does not want to do another thoracentesis at this point. Patient was also found to have NSTEMI. Patient also had acute metabolic encephalopathy and was barely responsive 5 days ago. Patient also has bilateral feet ankle and leg pain and her Lyrica was restarted and slowly improving.  Hospitalization further complicated by difficult disposition planning.  New events last 24 hours / Subjective: Overnight patient was noted to be more somnolent, less responsive. On my exam today, she is unresponsive to verbal and physical stimuli, she is moaning. Two days ago, she was fully conversant and yesterday, she was alert but slow to answer questions. Her medications including lyrica, keppra, and tramadol were held yesterday.   Assessment & Plan:   Principal Problem:   Multifocal pneumonia Active Problems:   Acquired hypothyroidism   HLD (hyperlipidemia)   Essential hypertension   Iron deficiency anemia   Chronic diastolic CHF (congestive heart failure) (HCC)   Anemia of chronic disease   COPD with chronic bronchitis (HCC)   Severe sepsis (HCC)   CKD (chronic kidney disease) stage 4, GFR 15-29 ml/min  (HCC)   HTN (hypertension)   GERD (gastroesophageal reflux disease)   CAD (coronary artery disease)   NSTEMI (non-ST elevated myocardial infarction) (HCC)   AF (paroxysmal atrial fibrillation) (HCC)   Seizure (HCC)   Elevated troponin   Abdominal pain   Diarrhea   Acute respiratory failure with hypoxia (HCC)   Recurrent left pleural effusion   Acute metabolic encephalopathy   Pain    Multifocal pneumonia Recurrent pleural effusion Severe sepsis secondary to above Patient with multiple thoracentesis during admission Thora 3/31, 250 cc fluid Thora 4/4, 600 cc fluid Follow-up chest x-ray shows improved air entry but persistent effusion Pulmonary ordered repeat Thora however upon ultrasound there was insufficient fluid so no tap was performed Patient has been weaned to room air as of 4/7 CT chest revealed bilateral small pleural effusion with compressive left sided atelectasis  Patient has completed 10-day course of antibiotics Pulmonology following, holding lasix, continue MetaNeb Recommend IS  Gout  Prednisone started  ?Left facial droop Some swelling of left lower face, very mild asymmetric smile noted 4/8  Head CT negative for acute change   Acute metabolic encephalopathy Suspect multifactorial etiology in the setting of sepsis and infection CT scan head negative  Delirium precautions Frequent neuro checks Initially had improvement, was fully conversant on 4/8, then mentation continued to worsen. Sounds like she had similar episode over 3/30-3/31, thought to be metabolic in nature at that time Stop lyrica, tramadol, keppra  Check EEG, ABG  Neuro consulted   NSTEMI Medical management per cardiology Was on heparin drip 2 days beginning of hospital course Troponin peak 1196 Continue Plavix, statin  Acute hypoxemic respiratory failure, resolved Weaned to room air as of 4/7  Acute kidney injury on chronic kidney disease stage IIIb Peak creatinine  1.84 Creatinine improved  Essential hypertension Norvasc discontinued  PAF Rate controlled No anticoagulation due to history of GI bleed  Seizure disorder Hold Keppra Check EEG   Iron deficiency anemia P.o. iron supplementation  Weakness Functional decline Continue PT OT, TOC assisting with disposition    DVT prophylaxis:  enoxaparin (LOVENOX) injection 40 mg Start: 06/01/20 2200 Place and maintain sequential compression device Start: 05/21/20 1353  Code Status: DNR Family Communication: None at bedside, spoke with daughter over the phone regarding patient's decompensation, current work up planned including EEG and Neuro consult. Daughter states that patient would not want a feeding tube. I discussed with her that if patient does not improve, then we should consider comfort measures as patient is unresponsive at this point.  Disposition Plan:  Status is: Inpatient  Remains inpatient appropriate because:Altered mental status, Ongoing diagnostic testing needed not appropriate for outpatient work up, IV treatments appropriate due to intensity of illness or inability to take PO and Inpatient level of care appropriate due to severity of illness   Dispo: The patient is from: SNF              Anticipated d/c is to: SNF              Patient currently is not medically stable to d/c. Decompensation overnight. Transfer to stepdown. Neuro consulted.    Difficult to place patient Yes   Antimicrobials:  Anti-infectives (From admission, onward)   Start     Dose/Rate Route Frequency Ordered Stop   05/25/20 1000  piperacillin-tazobactam (ZOSYN) IVPB 3.375 g        3.375 g 12.5 mL/hr over 240 Minutes Intravenous Every 8 hours 05/25/20 0849 05/28/20 0154   05/24/20 2200  piperacillin-tazobactam (ZOSYN) IVPB 3.375 g  Status:  Discontinued        3.375 g 12.5 mL/hr over 240 Minutes Intravenous Every 12 hours 05/24/20 1433 05/25/20 0849   05/24/20 0300  vancomycin (VANCOREADY) IVPB  750 mg/150 mL  Status:  Discontinued        750 mg 150 mL/hr over 60 Minutes Intravenous Every 48 hours 05/22/20 0219 05/23/20 0956   05/22/20 1400  piperacillin-tazobactam (ZOSYN) IVPB 3.375 g  Status:  Discontinued        3.375 g 12.5 mL/hr over 240 Minutes Intravenous Every 8 hours 05/22/20 1055 05/24/20 1433   05/22/20 0300  vancomycin (VANCOREADY) IVPB 2000 mg/400 mL        2,000 mg 200 mL/hr over 120 Minutes Intravenous  Once 05/22/20 0212 05/22/20 0426   05/20/20 0800  vancomycin (VANCOREADY) IVPB 750 mg/150 mL  Status:  Discontinued        750 mg 150 mL/hr over 60 Minutes Intravenous Every 48 hours 05/18/20 1309 05/19/20 0805   05/19/20 1000  ceFEPIme (MAXIPIME) 2 g in sodium chloride 0.9 % 100 mL IVPB  Status:  Discontinued        2 g 200 mL/hr over 30 Minutes Intravenous Every 24 hours 05/18/20 1253 05/22/20 1055   05/18/20 1000  vancomycin (VANCOREADY) IVPB 750 mg/150 mL        750 mg 150 mL/hr over 60 Minutes Intravenous  Once 05/18/20 0949 05/18/20 1405   05/18/20 0900  ceFEPIme (MAXIPIME) 2 g in sodium chloride 0.9 % 100 mL IVPB        2 g 200 mL/hr over 30 Minutes Intravenous  Once 05/18/20 0845 05/18/20 1141   05/18/20 0900  metroNIDAZOLE (FLAGYL) IVPB 500 mg        500 mg 100 mL/hr over 60 Minutes Intravenous  Once 05/18/20 0845 05/18/20 1450   05/18/20 0900  vancomycin (VANCOCIN) IVPB 1000 mg/200 mL premix        1,000 mg 200 mL/hr over 60 Minutes Intravenous  Once 05/18/20 0845 05/18/20 1242       Objective: Vitals:   06/02/20 2316 06/03/20 0148 06/03/20 0458 06/03/20 0805  BP: (!) 194/69 123/84 126/86   Pulse: 95 90 90   Resp: 20 20 20    Temp: 98.1 F (36.7 C) 98.7 F (37.1 C) 97.7 F (36.5 C)   TempSrc: Oral Oral Oral   SpO2: 96% 93% 93% 91%  Weight:      Height:        Intake/Output Summary (Last 24 hours) at 06/03/2020 0820 Last data filed at 06/02/2020 1729 Gross per 24 hour  Intake --  Output 1750 ml  Net -1750 ml   Filed Weights   05/27/20  0500 05/28/20 0424 06/01/20 0459  Weight: 82.9 kg 81.3 kg 80.4 kg    Examination: General exam: Appears unresponsive, somnolent, moaning  Respiratory system: Clear to auscultation. Respiratory effort normal. On room air  Cardiovascular system: S1 & S2 heard, RRR. No pedal edema. Gastrointestinal system: Abdomen is nondistended, soft  Central nervous system: Unresponsive to verbal and physical stimuli  Extremities: Symmetric in appearance bilaterally, right arm flexed, sling from previous fx    Data Reviewed: I have personally reviewed following labs and imaging studies  CBC: Recent Labs  Lab 05/28/20 0646 05/30/20 0839 06/01/20 0819  WBC 11.0* 12.1* 7.9  NEUTROABS  --  8.4*  --   HGB 9.9* 13.2 10.4*  HCT 31.6* 43.1 33.0*  MCV 94.3 95.8 94.0  PLT 263 350 825   Basic Metabolic Panel: Recent Labs  Lab 05/28/20 0646 05/29/20 1010 05/30/20 0742 05/31/20 1009 06/01/20 0819 06/02/20 0504 06/03/20 0135 06/03/20 0423  NA 145   < > 142 142 145 142 142  --   K 4.3   < > 4.9 4.6 4.5 4.7 4.6  --   CL 114*   < > 113* 110 112* 108 103  --   CO2 26   < > 24 24 26 27 27   --   GLUCOSE 119*   < > 107* 148* 99 122* 161*  --   BUN 37*   < > 42* 42* 45* 47* 43*  --   CREATININE 1.30*   < > 1.20* 1.21* 1.07* 1.06* 1.17*  --   CALCIUM 8.2*   < > 8.1* 7.9* 8.1* 8.2* 8.5*  --   MG 1.9  --   --   --   --   --   --   --   PHOS  --   --   --   --   --   --   --  2.4*   < > = values in this interval not displayed.   GFR: Estimated Creatinine Clearance: 30.8 mL/min (A) (by C-G formula based on SCr of 1.17 mg/dL (H)). Liver Function Tests: No results for input(s): AST, ALT, ALKPHOS, BILITOT, PROT, ALBUMIN in the last 168 hours. No results for input(s): LIPASE, AMYLASE in the last 168 hours. No results for input(s): AMMONIA in the last 168 hours. Coagulation Profile: No results for input(s): INR, PROTIME in the last 168 hours. Cardiac Enzymes: No results for input(s): CKTOTAL, CKMB,  CKMBINDEX, TROPONINI in the last 168  hours. BNP (last 3 results) No results for input(s): PROBNP in the last 8760 hours. HbA1C: No results for input(s): HGBA1C in the last 72 hours. CBG: No results for input(s): GLUCAP in the last 168 hours. Lipid Profile: No results for input(s): CHOL, HDL, LDLCALC, TRIG, CHOLHDL, LDLDIRECT in the last 72 hours. Thyroid Function Tests: No results for input(s): TSH, T4TOTAL, FREET4, T3FREE, THYROIDAB in the last 72 hours. Anemia Panel: No results for input(s): VITAMINB12, FOLATE, FERRITIN, TIBC, IRON, RETICCTPCT in the last 72 hours. Sepsis Labs: No results for input(s): PROCALCITON, LATICACIDVEN in the last 168 hours.  Recent Results (from the past 240 hour(s))  Body fluid culture w Gram Stain     Status: None   Collection Time: 05/24/20 11:58 AM   Specimen: PATH Cytology Pleural fluid  Result Value Ref Range Status   Specimen Description   Final    PLEURAL Performed at [facility], [address] [place], [place] [address]    Special Requests   Final    NONE Performed at [facility], [address] [place], [place] [address]    Gram Stain   Final    FEW WBC PRESENT, PREDOMINANTLY MONONUCLEAR NO ORGANISMS SEEN    Culture   Final    NO GROWTH 3 DAYS Performed at [facility], [place] [address] [place], [place] [address]    Report Status 05/27/2020 FINAL  Final  Body fluid culture w Gram Stain     Status: None   Collection Time: 05/28/20 11:00 AM   Specimen: PATH Cytology Pleural fluid  Result Value Ref Range Status   Specimen Description   Final    PLEURAL Performed at [facility], [address] [place], [place] [address]    Special Requests   Final    NONE Performed at [facility], [address] [place], [place] [address]    Gram Stain   Final    RARE WBC PRESENT, PREDOMINANTLY MONONUCLEAR NO ORGANISMS SEEN    Culture   Final    NO GROWTH 3 DAYS Performed at [facility], [place] [address], [place], [place] [address]    Report Status 06/01/2020 FINAL  Final      Radiology Studies: CT HEAD WO CONTRAST  Result Date: 06/01/2020 CLINICAL DATA:  Initial evaluation for acute neuro deficit, stroke suspected, left facial droop. EXAM: CT HEAD WITHOUT CONTRAST TECHNIQUE: Contiguous axial images were obtained from the base of the skull through the vertex without intravenous contrast. COMPARISON:  Prior head CT from 05/24/2020. FINDINGS: Brain: Generalized age-related cerebral atrophy with mild chronic small vessel ischemic disease. No acute intracranial hemorrhage. No acute large vessel territory infarct. No mass lesion, midline shift or mass effect. No hydrocephalus or extra-axial fluid collection. Vascular: No hyperdense vessel. Scattered vascular calcifications noted within the carotid siphons. Skull: Scalp soft tissues and calvarium within normal limits. Sinuses/Orbits: Globes and orbital soft tissues demonstrate no acute finding. Paranasal sinuses are largely clear. No mastoid effusion. Other: None. IMPRESSION: 1. No acute intracranial abnormality. 2. Generalized age-related cerebral atrophy with mild chronic small vessel ischemic disease. Electronically Signed   By: Jeannine Boga M.D.   On: 06/01/2020 15:54   DG Chest Port 1 View  Result Date: 06/02/2020 CLINICAL DATA:  87 year old female with history of acute respiratory failure with hypoxia. Community-acquired pneumonia. EXAM: PORTABLE CHEST 1 VIEW COMPARISON:  Chest x-ray 05/29/2020. FINDINGS: Large left pleural effusion. Opacity in the left mid to lower hemithorax compatible with  underlying atelectasis and/or consolidation. Ill-defined opacity in the right base worsened compared to the prior study, partially obscuring the right hemidiaphragm, concerning for developing consolidation in the right lower lobe and likely right middle lobe. No pneumothorax. No evidence of pulmonary edema. Cardiac silhouette is  largely obscured. Upper mediastinal contours are within normal limits allowing for patient rotation to the left. Atherosclerotic calcifications in the thoracic aorta. Severe calcifications of the mitral annulus. Old comminuted fracture of the right proximal humerus with chronic nonunion. IMPRESSION: 1. Worsening severe multilobar bilateral pneumonia with large left parapneumonic pleural effusion. 2. Aortic atherosclerosis. Electronically Signed   By: Vinnie Langton M.D.   On: 06/02/2020 09:52      Scheduled Meds: . atorvastatin  20 mg Oral Daily  . bisacodyl  10 mg Rectal QHS  . Chlorhexidine Gluconate Cloth  6 each Topical Daily  . clopidogrel  75 mg Oral Daily  . dextromethorphan-guaiFENesin  1 tablet Oral BID  . doxazosin  2 mg Oral Q12H  . enoxaparin (LOVENOX) injection  40 mg Subcutaneous Q24H  . iron polysaccharides  150 mg Oral Daily  . levothyroxine  100 mcg Oral Q0600  . lidocaine  1 patch Transdermal Q24H  . magnesium oxide  400 mg Oral Daily  . mometasone-formoterol  2 puff Inhalation BID  . pantoprazole  20 mg Oral Daily  . predniSONE  10 mg Oral Q breakfast  . sodium chloride flush  10-40 mL Intracatheter Q12H  . sodium phosphate  1 enema Rectal Daily   Continuous Infusions:   LOS: 16 days      Time spent: 45 minutes   Dessa Phi, DO [facility] 06/03/2020, 8:20 AM   Available via Epic secure chat 7am-7pm After these hours, please refer to coverage provider listed on [email or web address]

## 2020-06-03 NOTE — Progress Notes (Signed)
RT reports that patient is not the same as the day before. Per the RT patient was alert and conversing with her the day before.  Patient received from day shift RN non-verbal. Beside shift report done and RN stated this was patient baseline all day. MD noted stated patient was more lethargic and that it was believed patient had pain medcations. This RN assessed patient and vital signs were stable, patient responded to tactile stimuli. Patient received on RA. This RN explained this report to RT who stated she was not told these changes by the day shift RT so she was not aware. Per RT, this RN could take the BiPAP off.  This RN has turned off the BiPAP as patient does not appear in any distress and sats are 95%.  This RN updated the on call provided to see if anything has been missed in this patient's case. Will continue to monitor and endorse.

## 2020-06-03 NOTE — TOC Progression Note (Signed)
Transition of Care [facility]) - Progression Note    Patient Details  Name: Amy [name] MRN: [id number] Date of Birth: May 04, 1927  Transition of Care [facility]) CM/SW Contact  Izola Price, RN Phone Number: 06/03/2020, 10:19 AM  Clinical Narrative:  Provider asked for Home Hospice referral indicating patient may only have days. Spoke with daughter Amy [name] [phone]) to discuss needs/options. Daughter feels overwhelmed and had some difficulty understanding differences in options.  [facility], [place], [phone], as they may be able set up by Monday. Mr. Caryl Pina will contact [facility] to discuss options and get back to RN CM. Simmie Davies RN CM         Expected Discharge Plan and Services                                                 Social Determinants of Health (SDOH) Interventions    Readmission Risk Interventions Readmission Risk Prevention Plan 08/23/2019 08/15/2019  Transportation Screening Complete Complete  Medication Review [employer]) Complete Complete  PCP or Specialist appointment within 3-5 days of discharge Complete Complete  HRI or Home Care Consult Complete Complete  SW Recovery Care/Counseling Consult Complete Complete  Palliative Care Screening Not Applicable Not Applicable  Skilled Nursing Facility Complete Not Applicable  Some recent data might be hidden

## 2020-06-03 NOTE — Progress Notes (Signed)
Patient had a positive Covid test on 05/18/20 (admission). Per H&P she had Covid on 03/06/20 so not on isolation.  Patient is now comfort care and need Covid test prior d/c to hospice home.  This Probation officer checked with Dr. Maylene Roes if patient would required isolation precautions if results positive.  Per MD patient would not require isolation precautions.

## 2020-06-03 NOTE — Progress Notes (Addendum)
  PROGRESS NOTE  Spoke with daughter [name] at bedside. Patient remains unresponsive, moaning in pain. Daughter wants to take patient home with hospice, transition to full comfort care. We discussed use of morphine for pain control, foley catheter for end-of-life care. TOC consulted for home hospice. Orders in to reflect comfort care.   Daughter now wants patient to transfer to residential hospice. This is appropriate, prognosis < 2 weeks, likely days, if she remains with encephalopathy and unable to eat/drink.   Her initial Covid was positive 1/11 and completed treatment at that time and remains out of isolation. Hospice facility requesting repeat Covid test for admission.    Dessa Phi, DO [facility] 06/03/2020, 9:41 AM  Available via Epic secure chat 7am-7pm After these hours, please refer to coverage provider listed on [email or web address]

## 2020-06-03 NOTE — Progress Notes (Signed)
   06/02/20 2001  Assess: MEWS Score  Temp 98.4 F (36.9 C)  BP (!) 169/58  Pulse Rate 94  ECG Heart Rate 99  Resp (!) 22  Level of Consciousness Responds to Pain  SpO2 95 %  O2 Device Room Air  Patient Activity (if Appropriate) In bed  Assess: MEWS Score  MEWS Temp 0  MEWS Systolic 0  MEWS Pulse 0  MEWS RR 1  MEWS LOC 2  MEWS Score 3  MEWS Score Color Yellow  Assess: if the MEWS score is Yellow or Red  Were vital signs taken at a resting state? Yes  Focused Assessment No change from prior assessment  Early Detection of Sepsis Score *See Row Information* Medium  MEWS guidelines implemented *See Row Information* No, vital signs rechecked  Treat  MEWS Interventions Other (Comment) (continue to monitor the blood pressure is not in range for PRN hydralazine)  Pain Scale PAINAD  Breathing 0  Negative Vocalization 1  Facial Expression 1  Body Language 0  Consolability 1  PAINAD Score 3  Neuro symptoms relieved by Rest;Music  Take Vital Signs  Increase Vital Sign Frequency  Yellow: Q 2hr X 2 then Q 4hr X 2, if remains yellow, continue Q 4hrs  Escalate  MEWS: Escalate Yellow: discuss with charge nurse/RN and consider discussing with provider and RRT  Notify: Charge Nurse/RN  Name of Charge Nurse/RN Notified [place]  Date Charge Nurse/RN Notified 06/02/20  Time Charge Nurse/RN Notified 2045  Notify: Provider  Provider Name/Title Sharion Settler NP  Date Provider Notified 06/03/20  Time Provider Notified 0100  Notification Type  (secure chat)  Notification Reason Other (Comment) (Concerns from RRT for change in status)  Provider response See new orders  Date of Provider Response 06/03/20  Time of Provider Response 0100  Notify: Rapid Response  Name of Rapid Response RN Notified N/A  Document  Patient Outcome Other (Comment) (stable)  Progress note created (see row info) Yes

## 2020-06-04 DIAGNOSIS — R29810 Facial weakness: Secondary | ICD-10-CM

## 2020-06-04 DIAGNOSIS — Z515 Encounter for palliative care: Secondary | ICD-10-CM

## 2020-06-04 DIAGNOSIS — R531 Weakness: Secondary | ICD-10-CM

## 2020-06-04 DIAGNOSIS — R5381 Other malaise: Secondary | ICD-10-CM

## 2020-06-04 DIAGNOSIS — M109 Gout, unspecified: Secondary | ICD-10-CM

## 2020-06-04 DIAGNOSIS — Z66 Do not resuscitate: Secondary | ICD-10-CM

## 2020-06-04 LAB — SARS CORONAVIRUS 2 (TAT 6-24 HRS): SARS Coronavirus 2: NEGATIVE

## 2020-06-04 NOTE — Care Management Important Message (Signed)
Important Message  Patient Details  Name: Amy [name] MRN: [id number] Date of Birth: 05/03/1927   Medicare Important Message Given:  Other (see comment)  On comfort measures with plan to transition to hospice services.  Medicare IM withheld at this time out of respect for patient and family.   Dannette Barbara 06/04/2020, 8:48 AM

## 2020-06-04 NOTE — Progress Notes (Signed)
[facility] Liaison note:  New referral for [facility] hospice home received from [facility]. Patient information sent on 4/10. Hospice home eligibility has been confirmed.  Writer met with patient's daughter Amy [name] to initiate education regarding hospice services, philosophy, team approach to care and current visitation policy with understanding voiced. Patient had a repeat COVID test yesterday, results are still pending. She did have COVID in January of this year and still tested positive on 3/25, she is not on any contact precautions here at [facility]. Plan is for 5 pm transfer. Signed DNR in place in the discharge packet. Report has been called to the hospice home. Please leave IV and foley catheter in place at discharge. Hospital care team all updated. Thank you for the opportunity to be involved in the care of this patient and her family. Flo Shanks BSN, RN, [place] [phone]

## 2020-06-04 NOTE — Discharge Summary (Signed)
Physician Discharge Summary  Amy [name] RJJ:[id number] DOB: [birth year] DOA: 05/18/2020  PCP: Alvester Morin, MD  Admit date: 05/18/2020 Discharge date: 06/04/2020  Admitted From: SNF Disposition:  Residential hospice   CODE STATUS: DNR  Diet recommendation: Comfort feeding   Brief/Interim Summary: Patient was admitted on 05/18/2020 with shortness of breath and abdominal pain. Past medical history of hypertension, hyperlipidemia, type 2 diabetes, COPD, CAD, hypothyroidism, atrial fibrillation, seizure, chronic kidney disease, anemia, CHF. She was found to have clinical sepsis with multifocal pneumonia and left pleural effusion. The patient has received 10 days of antibiotics. She had a thoracentesis on 05/24/2020 removing 250 mL of fluid. Another thoracentesis on 05/28/2020 removing 600 mL of fluid. Nothing growing on a either culture. Follow-up cytology of the second thoracentesis. Repeat chest x-ray little bit better than previous x-rays. Pulmonary does not want to do another thoracentesis at this point. Patient was also found to have NSTEMI. Patient also had acute metabolic encephalopathy and was barely responsive, which slowly improved. Patient also has bilateral feet ankle and leg pain and her Lyrica was restarted as well as prednisone for gout.  Over the past several days, patient has had decompensation in her mentation.  On 4/10, patient became unresponsive.  After discussion with family, patient was transitioned to comfort care and transferred to residential hospice on 4/11.  Discharge Diagnoses:  Principal Problem:   Multifocal pneumonia Active Problems:   Acquired hypothyroidism   HLD (hyperlipidemia)   Essential hypertension   Iron deficiency anemia   Chronic diastolic CHF (congestive heart failure) (HCC)   AKI (acute kidney injury) (HCC)   Anemia of chronic disease   COPD with chronic bronchitis (HCC)   Severe sepsis (HCC)   CKD (chronic kidney disease)  stage 4, GFR 15-29 ml/min (HCC)   HTN (hypertension)   GERD (gastroesophageal reflux disease)   CAD (coronary artery disease)   NSTEMI (non-ST elevated myocardial infarction) (HCC)   AF (paroxysmal atrial fibrillation) (HCC)   Seizure (HCC)   Elevated troponin   Abdominal pain   Diarrhea   Acute respiratory failure with hypoxia (HCC)   Recurrent left pleural effusion   Acute metabolic encephalopathy   Pain   Facial droop   Gout   Weak   Physical deconditioning   DNR (do not resuscitate)   Comfort measures only status     Discharge Instructions   Allergies as of 06/04/2020      Reactions   Atenolol Other (See Comments)   Other reaction(s): Other (See Comments) Decreased heart rate Decreased heart rate   Codeine Shortness Of Breath   Rash, difficulty breathing, nausea.   Losartan Other (See Comments)   Hyperkalemia   Morphine And Related Shortness Of Breath   Rash, difficulty breathing, nausea.   Fentanyl Other (See Comments)   hallucinations   Nsaids Other (See Comments)   Other reaction(s): Unknown   Rofecoxib Other (See Comments)   Other reaction(s): Unknown   Sucralfate Other (See Comments)   Throat tightness   Sulfa Antibiotics Other (See Comments)   Other reaction(s): Unknown   Tramadol Other (See Comments)   lethargy      Medication List    STOP taking these medications   acetaminophen 500 MG tablet Commonly known as: TYLENOL   albuterol 108 (90 Base) MCG/ACT inhaler Commonly known as: VENTOLIN HFA   amitriptyline 10 MG tablet Commonly known as: ELAVIL   clopidogrel 75 MG tablet Commonly known as: PLAVIX   cyanocobalamin 1000 MCG/ML injection Commonly known as: (VITAMIN  B-12)   dextromethorphan-guaiFENesin 30-600 MG 12hr tablet Commonly known as: MUCINEX DM   docusate sodium 100 MG capsule Commonly known as: COLACE   doxazosin 2 MG tablet Commonly known as: CARDURA   iron polysaccharides 150 MG capsule Commonly known as: NIFEREX    levETIRAcetam 250 MG tablet Commonly known as: KEPPRA   levothyroxine 88 MCG tablet Commonly known as: SYNTHROID   lisinopril 2.5 MG tablet Commonly known as: ZESTRIL   melatonin 5 MG Tabs   montelukast 10 MG tablet Commonly known as: SINGULAIR   nitroGLYCERIN 0.4 MG SL tablet Commonly known as: NITROSTAT   pantoprazole 20 MG tablet Commonly known as: PROTONIX   polyethylene glycol 17 g packet Commonly known as: MIRALAX / GLYCOLAX   pregabalin 50 MG capsule Commonly known as: LYRICA   simvastatin 40 MG tablet Commonly known as: ZOCOR   sodium zirconium cyclosilicate 10 g Pack packet Commonly known as: LOKELMA   Symbicort 160-4.5 MCG/ACT inhaler Generic drug: budesonide-formoterol   torsemide 20 MG tablet Commonly known as: DEMADEX   traMADol 50 MG tablet Commonly known as: ULTRAM       Allergies  Allergen Reactions  . Atenolol Other (See Comments)    Other reaction(s): Other (See Comments) Decreased heart rate Decreased heart rate  . Codeine Shortness Of Breath    Rash, difficulty breathing, nausea.  . Losartan Other (See Comments)    Hyperkalemia  . Morphine And Related Shortness Of Breath    Rash, difficulty breathing, nausea.   . Fentanyl Other (See Comments)    hallucinations  . Nsaids Other (See Comments)    Other reaction(s): Unknown  . Rofecoxib Other (See Comments)    Other reaction(s): Unknown  . Sucralfate Other (See Comments)    Throat tightness  . Sulfa Antibiotics Other (See Comments)    Other reaction(s): Unknown  . Tramadol Other (See Comments)    lethargy     Procedures/Studies: DG Abd 1 View  Result Date: 06/03/2020 CLINICAL DATA:  Abdominal pain. EXAM: ABDOMEN - 1 VIEW COMPARISON:  03/15/2019 FINDINGS: Gas-filled small bowel loops are seen in the central abdomen with air in stool scattered along the length of a nondilated colon. Prominent rectal stool evident. Left paramidline in vascular stent is probably in the left  renal artery. Bones are diffusely demineralized. IMPRESSION: 1. Nonspecific bowel gas pattern. No evidence for bowel obstruction. Electronically Signed   By: Misty Stanley M.D.   On: 06/03/2020 09:19   CT HEAD WO CONTRAST  Result Date: 06/01/2020 CLINICAL DATA:  Initial evaluation for acute neuro deficit, stroke suspected, left facial droop. EXAM: CT HEAD WITHOUT CONTRAST TECHNIQUE: Contiguous axial images were obtained from the base of the skull through the vertex without intravenous contrast. COMPARISON:  Prior head CT from 05/24/2020. FINDINGS: Brain: Generalized age-related cerebral atrophy with mild chronic small vessel ischemic disease. No acute intracranial hemorrhage. No acute large vessel territory infarct. No mass lesion, midline shift or mass effect. No hydrocephalus or extra-axial fluid collection. Vascular: No hyperdense vessel. Scattered vascular calcifications noted within the carotid siphons. Skull: Scalp soft tissues and calvarium within normal limits. Sinuses/Orbits: Globes and orbital soft tissues demonstrate no acute finding. Paranasal sinuses are largely clear. No mastoid effusion. Other: None. IMPRESSION: 1. No acute intracranial abnormality. 2. Generalized age-related cerebral atrophy with mild chronic small vessel ischemic disease. Electronically Signed   By: Jeannine Boga M.D.   On: 06/01/2020 15:54   CT HEAD WO CONTRAST  Result Date: 05/24/2020 CLINICAL DATA:  Mental status change  EXAM: CT HEAD WITHOUT CONTRAST TECHNIQUE: Contiguous axial images were obtained from the base of the skull through the vertex without intravenous contrast. COMPARISON:  05/22/2020 FINDINGS: Brain: There is no acute intracranial hemorrhage, mass effect, or edema. No new loss of gray-white differentiation. Small chronic left cerebellar infarct. Patchy hypoattenuation in the supratentorial white matter likely reflects stable chronic microvascular ischemic changes. There is no extra-axial fluid  collection. Ventricles and sulci are stable in size and configuration. Vascular: There is atherosclerotic calcification at the skull base. Skull: Calvarium is unremarkable. Sinuses/Orbits: Mild mucosal thickening. No acute orbital abnormality. Other: None. IMPRESSION: No acute intracranial abnormality. No significant change since recent prior study. Electronically Signed   By: Macy Mis M.D.   On: 05/24/2020 17:14   CT CHEST WO CONTRAST  Result Date: 05/31/2020 CLINICAL DATA:  Respiratory failure, post thoracentesis, pneumonia, RIGHT shoulder pain EXAM: CT CHEST WITHOUT CONTRAST TECHNIQUE: Multidetector CT imaging of the chest was performed following the standard protocol without IV contrast. Sagittal and coronal MPR images reconstructed from axial data set. COMPARISON:  05/27/2020 FINDINGS: Cardiovascular: Atherosclerotic calcifications aorta, proximal great vessels and coronary arteries. Enlargement of cardiac chambers. Mitral annular calcification. Aorta normal caliber. Minimal pericardial effusion. Mediastinum/Nodes: Large hiatal hernia. Esophagus unremarkable. Base of cervical region normal appearance. Normal sized mediastinal lymph nodes. No thoracic adenopathy. Lungs/Pleura: BILATERAL small pleural effusions. Atelectasis of LEFT lower lobe with compressive atelectasis in BILATERAL upper lobes LEFT greater than RIGHT and in RIGHT lower lobe. No definite pulmonary mass identified though unable to exclude underlying abnormalities within consolidated/atelectatic LEFT lung. No pneumothorax. Upper Abdomen: Multiple cysts and small calcified nodule at upper pole of LEFT kidney. Musculoskeletal: Chronic displaced fracture at surgical neck RIGHT humerus. Osseous demineralization. IMPRESSION: BILATERAL small pleural effusions with compressive atelectasis of the lungs greatest in LEFT lower lobe. No definite pulmonary mass identified though unable to exclude underlying abnormalities within  consolidated/atelectatic LEFT lung. Large hiatal hernia. Extensive atherosclerotic calcifications including coronary arteries. Chronic displaced fracture of the proximal RIGHT humerus. Aortic Atherosclerosis (ICD10-I70.0). Electronically Signed   By: Lavonia Dana M.D.   On: 05/31/2020 15:36   CT CHEST WO CONTRAST  Result Date: 05/27/2020 CLINICAL DATA:  Pneumonia.  Abnormal chest radiograph EXAM: CT CHEST WITHOUT CONTRAST TECHNIQUE: Multidetector CT imaging of the chest was performed following the standard protocol without IV contrast. COMPARISON:  Chest CT 05/18/2020, radiograph 05/27/2020 FINDINGS: Cardiovascular: Valvular calcification. Coronary artery calcification and aortic atherosclerotic calcification. Mediastinum/Nodes: 11 mm RIGHT lower paratracheal lymph node. Lungs/Pleura: Large volume LEFT pleural effusion occupying the near entirety of the LEFT hemithorax. There is passive atelectasis with complete atelectasis of the LEFT lower lobe and lingula. Small volume of LEFT upper lobe is aerated. Small moderate RIGHT pleural effusion with mild passive atelectasis. Upper Abdomen: Limited view of the liver, kidneys, pancreas are unremarkable. Normal adrenal glands. Musculoskeletal: No aggressive osseous lesion. Degenerative osteophytosis of the spine. IMPRESSION: 1. Progressive large LEFT pleural effusion now occupies the near entirety of the LEFT hemithorax. Complete passive atelectasis of the LEFT lower lobe and lingula. Minimal aeration of the LEFT upper lobe. 2. Small to moderate RIGHT pleural effusion. 3. Coronary artery calcification and Aortic Atherosclerosis (ICD10-I70.0). Electronically Signed   By: Suzy Bouchard M.D.   On: 05/27/2020 18:06   [place] CHEST (PLEURAL EFFUSION)  Result Date: 05/30/2020 CLINICAL DATA:  87 year old female with cough and shortness of breath, recent left thoracentesis. EXAM: CHEST ULTRASOUND COMPARISON:  05/28/2020, 05/29/2020 FINDINGS: Trace bilateral pleural effusions,  right greater than left. IMPRESSION: Trace  bilateral pleural effusions, right greater than left. No thoracentesis performed. Left-sided opacities on chest radiograph most likely indicative of atelectasis and or infiltrative airspace process. Ruthann Cancer, MD [facility] Electronically Signed   By: Ruthann Cancer MD   On: 05/30/2020 16:26   DG Chest Port 1 View  Result Date: 06/02/2020 CLINICAL DATA:  87 year old female with history of acute respiratory failure with hypoxia. Community-acquired pneumonia. EXAM: PORTABLE CHEST 1 VIEW COMPARISON:  Chest x-ray 05/29/2020. FINDINGS: Large left pleural effusion. Opacity in the left mid to lower hemithorax compatible with underlying atelectasis and/or consolidation. Ill-defined opacity in the right base worsened compared to the prior study, partially obscuring the right hemidiaphragm, concerning for developing consolidation in the right lower lobe and likely right middle lobe. No pneumothorax. No evidence of pulmonary edema. Cardiac silhouette is largely obscured. Upper mediastinal contours are within normal limits allowing for patient rotation to the left. Atherosclerotic calcifications in the thoracic aorta. Severe calcifications of the mitral annulus. Old comminuted fracture of the right proximal humerus with chronic nonunion. IMPRESSION: 1. Worsening severe multilobar bilateral pneumonia with large left parapneumonic pleural effusion. 2. Aortic atherosclerosis. Electronically Signed   By: Vinnie Langton M.D.   On: 06/02/2020 09:52   DG Chest Port 1 View  Result Date: 05/29/2020 CLINICAL DATA:  87 year old female with cough and progressive shortness of breath. Hypoxia on room air. Recent left side thoracentesis. EXAM: PORTABLE CHEST 1 VIEW COMPARISON:  Portable chest 05/28/2020 and earlier. FINDINGS: Portable AP semi upright view at 1021 hours. Improved lung volumes from yesterday. The patient is less  rotated now. Stable cardiac size and mediastinal contours. Left lung ventilation remains improved compared to the CT on 05/27/2020, with residual lower lung opacity which is probably a combination of residual effusion and airspace disease. Right lung appears stable. No pneumothorax. Visualized tracheal air column is within normal limits. Stable visualized osseous structures. Un healed proximal right humerus fracture. Paucity of bowel gas in the upper abdomen. IMPRESSION: 1. Left lung ventilation remains improved since the CT on 05/27/2020 with residual lower lung combined pleural effusion and airspace disease suspected. 2. No new cardiopulmonary abnormality. Electronically Signed   By: Genevie Ann M.D.   On: 05/29/2020 10:55   DG Chest Port 1 View  Result Date: 05/28/2020 CLINICAL DATA:  Status post left-sided thoracentesis EXAM: PORTABLE CHEST 1 VIEW COMPARISON:  CT chest and chest x-ray obtained yesterday FINDINGS: No evidence of pneumothorax following left-sided thoracentesis. Slightly improved aeration of the left upper lung. Persistent dense opacification of the left mid and lower chest which may represent persistent pleural fluid versus persistent atelectasis. The right lung remains clear. Chronic right proximal humeral fracture again noted. No acute osseous abnormality. Atherosclerotic calcifications present in the transverse aorta. IMPRESSION: 1. No evidence of pneumothorax following left-sided thoracentesis. 2. Improved aeration of the left upper lung but persistent opacification of the left mid and lower lung. Findings may represent residual atelectasis versus residual pleural fluid. Electronically Signed   By: Jacqulynn Cadet M.D.   On: 05/28/2020 11:24   DG Chest Port 1 View  Result Date: 05/27/2020 CLINICAL DATA:  Pneumonia. EXAM: PORTABLE CHEST 1 VIEW COMPARISON:  05/24/2020; 05/22/2020 FINDINGS: Grossly unchanged cardiac silhouette and mediastinal contours with persistent obscuration of the right  heart border secondary to unchanged left-sided pleural effusion and associated left mid lower lung consolidative opacities, similar to the 05/22/2020 examination. Atherosclerotic plaque within the aortic arch and mitral valve calcifications. Mild pulmonary venous congestion within  the otherwise well aerated right lung. No definite right-sided pleural effusion though the right costophrenic angle is excluded from view. No acute osseous abnormalities. IMPRESSION: 1. Similar appearing extensive left lung consolidative opacities and small left-sided effusion worrisome for multifocal pneumonia. 2. Suspected pulmonary venous congestion within the otherwise well aerated right lung. Electronically Signed   By: Sandi Mariscal M.D.   On: 05/27/2020 09:30   DG Chest Port 1 View  Result Date: 05/24/2020 CLINICAL DATA:  Post left-sided thoracentesis. EXAM: PORTABLE CHEST 1 VIEW COMPARISON:  Chest radiograph-05/22/2020; 05/18/2020; chest CT-05/18/2020 FINDINGS: Grossly unchanged cardiac silhouette and mediastinal contours with atherosclerotic plaque within thoracic aorta. Dystrophic calcifications within the mitral valve annulus. Interval reduction/near resolution of left-sided pleural effusion post thoracentesis with improved aeration of the left lung apex. Redemonstrated extensive left mid and lower lung heterogeneous/consolidative opacities with associated left-sided volume loss and deviation of the cardiomediastinal structures to the left. Pulmonary vasculature within the right lung remains indistinct. No definite right-sided pleural effusion. No acute osseous abnormalities. Redemonstrated chronic displaced fracture involving the anatomic neck of the right humerus, incompletely evaluated. IMPRESSION: 1. Interval reduction/near resolution of left-sided pleural effusion post thoracentesis. No pneumothorax. 2. Otherwise, similar findings of extensive left mid and lower lung heterogeneous/consolidative opacities with associated  volume loss. Electronically Signed   By: Sandi Mariscal M.D.   On: 05/24/2020 12:15   DG Chest Port 1 View  Result Date: 05/22/2020 CLINICAL DATA:  Hypoxia EXAM: PORTABLE CHEST 1 VIEW COMPARISON:  05/18/2020 FINDINGS: Large left pleural effusion with worsening left upper consolidation. Chronic right humeral head fracture. Mild right chest opacity. IMPRESSION: Large left pleural effusion with worsening left upper consolidation. Electronically Signed   By: Ulyses Jarred M.D.   On: 05/22/2020 01:44   DG Chest Port 1 View  Result Date: 05/18/2020 CLINICAL DATA:  Shortness of breath and fever EXAM: PORTABLE CHEST 1 VIEW COMPARISON:  March 06, 2020 FINDINGS: There is extensive airspace opacity throughout most of the left lung. The right lung is clear. There is cardiomegaly with mild pulmonary venous hypertension. No adenopathy. There is aortic atherosclerosis. There is calcification in each carotid artery. No bone lesions. IMPRESSION: Widespread airspace opacity throughout much of the left lung consistent with multifocal pneumonia. Right lung clear. There is cardiomegaly with a degree of pulmonary vascular congestion. There is calcification in each carotid artery. Aortic Atherosclerosis (ICD10-I70.0). Electronically Signed   By: Lowella Grip III M.D.   On: 05/18/2020 09:08   CT HEAD CODE STROKE WO CONTRAST`  Result Date: 05/22/2020 CLINICAL DATA:  Code stroke.  Acute neurologic deficit EXAM: CT HEAD WITHOUT CONTRAST TECHNIQUE: Contiguous axial images were obtained from the base of the skull through the vertex without intravenous contrast. COMPARISON:  None. FINDINGS: Brain: There is no mass, hemorrhage or extra-axial collection. The size and configuration of the ventricles and extra-axial CSF spaces are normal. There is hypoattenuation of the periventricular white matter, most commonly indicating chronic ischemic microangiopathy. Vascular: No abnormal hyperdensity of the major intracranial arteries or  dural venous sinuses. No intracranial atherosclerosis. Skull: The visualized skull base, calvarium and extracranial soft tissues are normal. Sinuses/Orbits: No fluid levels or advanced mucosal thickening of the visualized paranasal sinuses. No mastoid or middle ear effusion. The orbits are normal. ASPECTS [facility] Stroke Program Early CT Score) - Ganglionic level infarction (caudate, lentiform nuclei, internal capsule, insula, M1-M3 cortex): 7 - Supraganglionic infarction (M4-M6 cortex): 3 Total score (0-10 with 10 being normal): 10 IMPRESSION: 1. Chronic ischemic microangiopathy without acute intracranial abnormality.  2. ASPECTS is 10. These results were called by telephone at the time of interpretation on 05/22/2020 at 12:35 am to provider [facility] , who verbally acknowledged these results. Electronically Signed   By: Ulyses Jarred M.D.   On: 05/22/2020 00:44   ECHOCARDIOGRAM LIMITED  Result Date: 05/23/2020    ECHOCARDIOGRAM LIMITED REPORT   Patient Name:   ANISHA STARLIPER Date of Exam: 05/23/2020 Medical Rec #:  [id number]          Height:       62.0 in Accession #:    [id number]         Weight:       181.8 lb Date of Birth:  [birth year]          BSA:          1.836 m Patient Age:    35 years           BP:           133/36 mmHg Patient Gender: F                  HR:           56 bpm. Exam Location:  [facility] Procedure: Limited Echo, Color Doppler and Cardiac Doppler Indications:     Chest pain R07.9                  NSTEMI I21.4  History:         Patient has prior history of Echocardiogram examinations, most                  recent 02/07/2020. Risk Factors:Hypertension and Diabetes.  Sonographer:     Sherrie Sport RDCS (AE) Referring Phys:  [id number] Arvil Chaco Diagnosing Phys: Kathlyn Sacramento MD IMPRESSIONS  1. Left ventricular ejection fraction, by estimation, is 55 to 60%. The left ventricle has normal function. Left ventricular endocardial border not optimally defined to evaluate regional wall  motion. Left ventricular diastolic function could not be evaluated.  2. Right ventricular systolic function is normal. The right ventricular size is normal. Tricuspid regurgitation signal is inadequate for assessing PA pressure.  3. Left atrial size was mildly dilated.  4. Moderate pleural effusion.  5. The mitral valve is abnormal. No evidence of mitral valve regurgitation. No evidence of mitral stenosis. Moderate mitral annular calcification.  6. The aortic valve is normal in structure. Aortic valve regurgitation is not visualized. Mild aortic valve stenosis. No gradients were measured.  7. limited and challenging study. FINDINGS  Left Ventricle: Left ventricular ejection fraction, by estimation, is 55 to 60%. The left ventricle has normal function. Left ventricular endocardial border not optimally defined to evaluate regional wall motion. The left ventricular internal cavity size was normal in size. There is no left ventricular hypertrophy. Left ventricular diastolic function could not be evaluated. Right Ventricle: The right ventricular size is normal. No increase in right ventricular wall thickness. Right ventricular systolic function is normal. Tricuspid regurgitation signal is inadequate for assessing PA pressure. Left Atrium: Left atrial size was mildly dilated. Right Atrium: Right atrial size was normal in size. Pericardium: There is no evidence of pericardial effusion. Mitral Valve: The mitral valve is abnormal. Moderate mitral annular calcification. No evidence of mitral valve stenosis. Tricuspid Valve: The tricuspid valve is normal in structure. Tricuspid valve regurgitation is not demonstrated. No evidence of tricuspid stenosis. Aortic Valve: The aortic valve is normal in structure. Aortic valve regurgitation is not visualized. Mild aortic  stenosis is present. Pulmonic Valve: The pulmonic valve was normal in structure. Pulmonic valve regurgitation is not visualized. No evidence of pulmonic stenosis.  Aorta: The aortic root is normal in size and structure. Venous: The inferior vena cava was not well visualized. IAS/Shunts: No atrial level shunt detected by color flow Doppler. Additional Comments: There is a moderate pleural effusion. LEFT VENTRICLE PLAX 2D LVIDd:         3.92 cm LVIDs:         2.74 cm LV PW:         1.54 cm LV IVS:        0.97 cm LVOT diam:     2.00 cm LVOT Area:     3.14 cm  LEFT ATRIUM         Index LA diam:    5.10 cm 2.78 cm/m                        PULMONIC VALVE AORTA                 PV Vmax:        1.12 m/s Ao Root diam: 2.40 cm PV Peak grad:   5.0 mmHg                       RVOT Peak grad: 12 mmHg   SHUNTS Systemic Diam: 2.00 cm Kathlyn Sacramento MD Electronically signed by Kathlyn Sacramento MD Signature Date/Time: 05/23/2020/10:19:48 AM    Final    CT CHEST ABDOMEN PELVIS WO CONTRAST  Result Date: 05/18/2020 CLINICAL DATA:  Chest pain or shortness of breath. Pleural effusion or pleurisy suspected EXAM: CT CHEST, ABDOMEN AND PELVIS WITHOUT CONTRAST TECHNIQUE: Multidetector CT imaging of the chest, abdomen and pelvis was performed following the standard protocol without IV contrast. COMPARISON:  01/13/2017 FINDINGS: CT CHEST FINDINGS Cardiovascular: Normal heart size. No pericardial effusion. Bulky atherosclerotic calcification of the aorta and great vessels. Coronary atherosclerosis. Mediastinum/Nodes: No adenopathy or mass. Moderate sliding hiatal hernia. Lungs/Pleura: Dense consolidation throughout the left lung with poor visualization of central airways. Small left pleural effusion. Musculoskeletal: Nonacute right humeral neck fracture with marked anterior displacement that is similar to initial diagnosis February 2022 by radiography. Extensive spondylosis and thoracic spine ankylosis. Multilevel degenerative facet spurring especially at the open T6-7 level where there is biforaminal impingement. CT ABDOMEN PELVIS FINDINGS Hepatobiliary: No focal liver abnormality.No evidence of biliary  obstruction or stone. Pancreas: Unremarkable. Spleen: Unremarkable. Adrenals/Urinary Tract: Negative adrenals. No hydronephrosis or stone. Left upper pole renal lesion which is a cyst by ultrasound in 2020. Peripherally calcified structure in the left upper pole which was also described on prior. Unremarkable bladder. Stomach/Bowel: No obstruction. No visible bowel inflammation. Numerous distal colonic diverticula. Distal colonic stool with moderate distension of the rectum. No superimposed rectal wall thickening. Vascular/Lymphatic: No acute vascular abnormality. No mass or adenopathy. Reproductive:Hysterectomy Other: No ascites or pneumoperitoneum. Musculoskeletal: No acute abnormalities. Severe lumbar spine degeneration which is generalized. Generalized osteopenia. Hip osteoarthritis that is particularly advanced on the right. IMPRESSION: 1. Extensive left-sided pneumonia opacifying most of the left lung. Small left parapneumonic effusion. 2. No acute intra-abdominal finding. 3. Numerous chronic/known incidental findings which are described above. Electronically Signed   By: Monte Fantasia M.D.   On: 05/18/2020 11:21   US THORACENTESIS ASP PLEURAL SPACE W/IMG GUIDE  Result Date: 05/28/2020 INDICATION: Patient with history of multifocal pneumonia with left sided pleural effusion. Request is for therapeutic  and diagnostic left-sided thoracentesis EXAM: ULTRASOUND GUIDED THERAPEUTIC AND DIAGNOSTIC THORACENTESIS MEDICATIONS: Lidocaine 1% 10 mL COMPLICATIONS: None immediate. PROCEDURE: An ultrasound guided thoracentesis was thoroughly discussed with the patient and questions answered. The benefits, risks, alternatives and complications were also discussed. The patient understands and wishes to proceed with the procedure. Written consent was obtained. Ultrasound was performed to localize and mark an adequate pocket of fluid in the left chest. The area was then prepped and draped in the normal sterile fashion. 1%  Lidocaine was used for local anesthesia. Under ultrasound guidance a 6 Fr Safe-T-Centesis catheter was introduced. Thoracentesis was performed. The catheter was removed and a dressing applied. FINDINGS: A total of approximately 600 mL of straw-colored fluid was removed. Samples were sent to the laboratory as requested by the clinical team. IMPRESSION: Successful ultrasound guided therapeutic and diagnostic left-sided thoracentesis yielding 600 mL of pleural fluid. Read by: Rushie Nyhan, NP Electronically Signed   By: Corrie Mckusick D.O.   On: 05/28/2020 11:33   US THORACENTESIS ASP PLEURAL SPACE W/IMG GUIDE  Result Date: 05/24/2020 INDICATION: Symptomatic left sided pleural effusion. Please perform ultrasound-guided thoracentesis for diagnostic and therapeutic purposes. EXAM: US THORACENTESIS ASP PLEURAL SPACE W/IMG GUIDE COMPARISON:  Chest radiograph-05/22/2020; 05/18/2020; chest CT-05/18/2020 MEDICATIONS: None. COMPLICATIONS: None immediate. TECHNIQUE: Informed written consent was obtained from the the patient and the patient's daughter after a discussion of the risks, benefits and alternatives to treatment. A timeout was performed prior to the initiation of the procedure. Initial ultrasound scanning demonstrates a small left-sided pleural effusion with dominant component loculated regional to the left upper lobe. The posterior aspect of the left upper/mid chest was prepped and draped in the usual sterile fashion. 1% lidocaine was used for local anesthesia. Under direct ultrasound guidance, an 8 Fr Safe-T-Centesis catheter was introduced. The thoracentesis was performed. The catheter was removed and a dressing was applied. The patient tolerated the procedure well without immediate post procedural complication. The patient was escorted to have an upright chest radiograph. FINDINGS: A total of approximately 250 cc of serous fluid was removed. Requested samples were sent to the laboratory. IMPRESSION:  Successful ultrasound-guided left sided thoracentesis yielding 250 cc of pleural fluid. Electronically Signed   By: Sandi Mariscal M.D.   On: 05/24/2020 12:20       Discharge Exam: Vitals:   06/03/20 0805 06/03/20 1207  BP:  (!) 112/43  Pulse:  80  Resp:  15  Temp:  98.7 F (37.1 C)  SpO2: 91% 94%    General: Pt is alert to voice but does not interact, appears to be comfortable Cardiovascular: RRR, S1/S2 +, no edema Respiratory: CTA bilaterally, on room air Abdominal: Soft, NT, ND Extremities: no edema, no cyanosis Psych: Remains confused, does not answer questions   The results of significant diagnostics from this hospitalization (including imaging, microbiology, ancillary and laboratory) are listed below for reference.     Microbiology: Recent Results (from the past 240 hour(s))  Body fluid culture w Gram Stain     Status: None   Collection Time: 05/28/20 11:00 AM   Specimen: PATH Cytology Pleural fluid  Result Value Ref Range Status   Specimen Description   Final    PLEURAL Performed at [facility], [address] [place], [place] [address]    Special Requests   Final    NONE Performed at [facility], [address] [place], [place] [address]    Gram Stain   Final    RARE WBC PRESENT,  PREDOMINANTLY MONONUCLEAR NO ORGANISMS SEEN    Culture   Final    NO GROWTH 3 DAYS Performed at [facility], [place] [address], [place], [place] [address]    Report Status 06/01/2020 FINAL  Final     Labs: BNP (last 3 results) Recent Labs    04/23/20 0530 05/18/20 1110 05/22/20 0052  BNP 370.5* 748.3* 678.9*   Basic Metabolic Panel: Recent Labs  Lab 05/30/20 0742 05/31/20 1009 06/01/20 0819 06/02/20 0504 06/03/20 0135 06/03/20 0423  NA 142 142 145 142 142  --   K 4.9 4.6 4.5 4.7 4.6  --   CL 113* 110 112* 108 103  --   CO2 24 24 26 27 27   --   GLUCOSE 107* 148* 99 122* 161*  --   BUN 42* 42* 45* 47* 43*  --   CREATININE 1.20* 1.21*  1.07* 1.06* 1.17*  --   CALCIUM 8.1* 7.9* 8.1* 8.2* 8.5*  --   PHOS  --   --   --   --   --  2.4*   Liver Function Tests: No results for input(s): AST, ALT, ALKPHOS, BILITOT, PROT, ALBUMIN in the last 168 hours. No results for input(s): LIPASE, AMYLASE in the last 168 hours. No results for input(s): AMMONIA in the last 168 hours. CBC: Recent Labs  Lab 05/30/20 0839 06/01/20 0819  WBC 12.1* 7.9  NEUTROABS 8.4*  --   HGB 13.2 10.4*  HCT 43.1 33.0*  MCV 95.8 94.0  PLT 350 288   Cardiac Enzymes: No results for input(s): CKTOTAL, CKMB, CKMBINDEX, TROPONINI in the last 168 hours. BNP: Invalid input(s): POCBNP CBG: No results for input(s): GLUCAP in the last 168 hours. D-Dimer No results for input(s): DDIMER in the last 72 hours. Hgb A1c No results for input(s): HGBA1C in the last 72 hours. Lipid Profile No results for input(s): CHOL, HDL, LDLCALC, TRIG, CHOLHDL, LDLDIRECT in the last 72 hours. Thyroid function studies No results for input(s): TSH, T4TOTAL, T3FREE, THYROIDAB in the last 72 hours.  Invalid input(s): FREET3 Anemia work up No results for input(s): VITAMINB12, FOLATE, FERRITIN, TIBC, IRON, RETICCTPCT in the last 72 hours. Urinalysis    Component Value Date/Time   COLORURINE YELLOW (A) 05/18/2020 1145   APPEARANCEUR HAZY (A) 05/18/2020 1145   APPEARANCEUR Cloudy (A) 03/15/2019 0909   LABSPEC 1.023 05/18/2020 1145   LABSPEC 1.025 10/11/2013 1637   PHURINE 5.0 05/18/2020 1145   GLUCOSEU NEGATIVE 05/18/2020 1145   GLUCOSEU Negative 10/11/2013 1637   HGBUR NEGATIVE 05/18/2020 1145   BILIRUBINUR NEGATIVE 05/18/2020 1145   BILIRUBINUR Negative 03/15/2019 0909   BILIRUBINUR Negative 10/11/2013 1637   KETONESUR NEGATIVE 05/18/2020 1145   PROTEINUR 30 (A) 05/18/2020 1145   NITRITE NEGATIVE 05/18/2020 1145   LEUKOCYTESUR NEGATIVE 05/18/2020 1145   LEUKOCYTESUR Negative 10/11/2013 1637   Sepsis Labs Invalid input(s): PROCALCITONIN,  WBC,   LACTICIDVEN Microbiology Recent Results (from the past 240 hour(s))  Body fluid culture w Gram Stain     Status: None   Collection Time: 05/28/20 11:00 AM   Specimen: PATH Cytology Pleural fluid  Result Value Ref Range Status   Specimen Description   Final    PLEURAL Performed at [facility], [address] [place], [place] [address]    Special Requests   Final    NONE Performed at [facility], [address] [place], [place] [address]    Gram Stain   Final    RARE WBC PRESENT, PREDOMINANTLY  MONONUCLEAR NO ORGANISMS SEEN    Culture   Final    NO GROWTH 3 DAYS Performed at [facility], [place] [address], [place], [place] [address]    Report Status 06/01/2020 FINAL  Final     Patient was seen and examined on the day of discharge and was found to be in stable condition. Time coordinating discharge: 35 minutes including assessment and coordination of care, as well as examination of the patient.   SIGNED:  Dessa Phi, DO [facility] 06/04/2020, 11:24 AM

## 2020-06-04 NOTE — Progress Notes (Signed)
Nutrition Brief Note  Chart reviewed. Pt now transitioning to comfort care.  No further nutrition interventions planned at this time.  Please re-consult as needed.   Neoma Uhrich W, RD, LDN, CDCES Registered Dietitian II Certified Diabetes Care and Education Specialist Please refer to [facility]ION for RD and/or RD on-call/weekend/after hours pager   

## 2020-06-04 NOTE — TOC Transition Note (Signed)
Transition of Care [facility]) - CM/SW Discharge Note   Patient Details  Name: Amy [name] MRN: [id number] Date of Birth: [birth year]  [facility] CM/SW Contact:  Candie Chroman, LCSW Phone Number: 06/04/2020, 12:09 PM   Clinical Narrative:  Patient has orders to discharge to [facility] today. [facility] set up for 5:00. No further concerns. CSW signing off.   Final next level of care: [place] Barriers to Discharge: Barriers Resolved   Patient Goals and CMS Choice     Choice offered to / list presented to : Adult Children  Discharge Placement                Patient to be transferred to facility by: [facility] Name of family member notified: Hospice liaison has spoken with daughter Ronnette Rump Patient and family notified of of transfer: 06/04/20  Discharge Plan and Services                            [place]: [facility]        Social Determinants of Health (SDOH) Interventions     Readmission Risk Interventions Readmission Risk Prevention Plan 08/23/2019 08/15/2019  Transportation Screening Complete Complete  Medication Review Press photographer) Complete Complete  PCP or Specialist appointment within 3-5 days of discharge Complete Complete  HRI or Home Care Consult Complete Complete  SW Recovery Care/Counseling Consult Complete Complete  Palliative Care Screening Not Applicable Not [place] Complete Not Applicable  Some recent data might be hidden

## 2020-06-06 ENCOUNTER — Telehealth: Payer: Medicare Other | Admitting: Family

## 2020-06-11 ENCOUNTER — Ambulatory Visit: Payer: Medicare Other | Admitting: Cardiovascular Disease

## 2020-06-26 ENCOUNTER — Ambulatory Visit: Payer: Medicare Other | Admitting: Cardiovascular Disease

## 2020-07-26 ENCOUNTER — Encounter: Payer: Self-pay | Admitting: Oncology

## 2020-08-02 ENCOUNTER — Ambulatory Visit: Payer: Medicare Other | Admitting: Podiatry

## 2020-10-01 ENCOUNTER — Encounter: Payer: Self-pay | Admitting: Oncology

## 2020-10-07 NOTE — Progress Notes (Deleted)
Cardiology Office Note  Date:  10/07/2020   ID:  Amy [name], DOB August 31, 1927, MRN [id number]  PCP:  Alvester Morin, MD   No chief complaint on file.   HPI:  Amy [name] is a pleasant 87 y.o. woman with a PMHx of:  Coronary Artery Disease   Proximal LAD with stent  COPD exacerbations Chronic baseline shortness of breath Morbid obesity Beta blocker has been held in the past secondary to bradycardia.   Off losartan secondary to hyperkalemia,  who now presents for routine follow up of her Coronary Artery Disease, leg weakness  Recent hospitalizations and events reviewed June -July 2021: PNA  In hospital 01/2021,  CHF  echo: EF 60-65% with grade 2 diastolic dysfunction and moderate to severe LVH.  treated with IV lasix 40-80 mg BID plus metolazone until 12/20  On torsemide  D/C 02/14/2020 Covid, in rehab  Suffered several seizures Back in hospital, d/c 03/08/20 Fall , Closed nondisplaced fracture of head of right radius Management recommended, doing PT, still significant pain MRI brain neg for acute CVA  Daughter presents with her today, reports it was recent Low BP , 80 systolic Several medications held Cardura dose decreased, HCTZ held  Followed by neurology, Dr. Melrose Nakayama Scheduled for EEG  Labs reviewed Albumin 2.8 in 03/07/20 Anemia, HGB  Followed by oncology  Weight down 4 pounds from one year ago   Pain right arm, also shingles pain  Creatinine 1.4  EKG personally reviewed by myself on todays visit NSR rate 53 bpm, RBBB LPFB  Past medical history reviewed In the hospital June 2021 Weakness, loss of consciousness, Head pain with shingles at the time Troponin 733 felt to be demand ischemia   OTHER PAST MEDICAL HISTORY REVIEWED BY ME AT TODAY'S VISIT:  DATE TEST EF   09/2016 Echo  65-70 %   09/2015 Echo  55-60 %   02/2015 Echo 60-65% mild AS, LVH   2019 hospitalization March  2019 Sepsis due to pneumonia, upper GI bleed melena,  acute on chronic diastolic CHF progressive shortness of breath with melena on arrival, pneumonia, treated with antibiotics pleural effusion felt secondary to CHF Asa plavix held prbc x1 HCT 25.7 D/c on plavix with no asa  Stress test 03/2017 Pharmacological myocardial perfusion imaging study with no significant Ischemia Small region of fixed apical thinning, consistent with attenuation artifact GI uptake artifact noted Normal wall motion, EF estimated at 56% No EKG changes concerning for ischemia at peak stress or in recovery. Low risk scan  2017 hospitalization mid August 2017 COPD exacerbation, required steroids, DuoNeb's, flutter device, antibiotics CT scan showed mucus plugging, postobstructive atelectasis  2015  [facility] August 2015. She had diarrhea, vomiting, felt to be viral also with acute renal failure that improved with hydration. Creatinine reached 1.86, one month later creatinine 1.0 as an outpatient Continues to be weak, does not walk much and is deconditioned   Guaiac positive and she had colonoscopy and EGD. She reports Dr. Vira Agar did not find anything Iron transfusions in the past under the direction of Dr. Ma Hillock.    Prior episode of shortness of breath while walking in the hospital to schedule a colonoscopy appointment. sent  to the emergency room for further evaluation. Systolic pressure  was in the 170 range. workup at that time was essentially normal and she was discharged home.  2013 01/14/2012 Stress test showed no ischemia, Lexiscan  2011 06/22/2009 Cardiac Catheter performed at [facility] details 90% proximal LAD, 40% mid LAD, 40% diagonal, 60%  proximal circumflex followed by 40% circumflex lesion, 30%, 40% and 30% lesion noted in the RCA with distal RCA with 30% and 25% lesions. Mild atheroma in the aorta.  Xience 2.75 x 12 mm Drug eluting stent placed.      PMH:   has a past medical history of Bladder incontinence, CAD (coronary artery disease), Cancer ([place]),  Carotid arterial disease w/ R Carotid Bruit (HCC), Chronic diastolic (congestive) heart failure (HCC), Chronic Dyspnea on exertion, CKD (chronic kidney disease) stage 3, GFR 30-59 ml/min (HCC), Degenerative arthritis of knee, Diabetes mellitus, GIB (gastrointestinal bleeding), Hiatal hernia, Hypertension, Iron deficiency, Menopausal symptoms, Morbid obesity ([place]), Renal insufficiency, and Thyroid disease.  PSH:    Past Surgical History:  Procedure Laterality Date   COLONOSCOPY  2015   COLONOSCOPY WITH PROPOFOL N/A 09/25/2016   Procedure: COLONOSCOPY WITH PROPOFOL;  Surgeon: [name], MD;  Location[facility] END[facility]COPY;  Service: Gastroenterology;  Laterality: N/A;   COLONOSCOPY WITH PROPOFOL N/A 09/26/2016   Procedure: COLONOSCOPY WITH PROPOFOL;  Surgeon: [name], MD;  Location: [facility] ENDOSCOPY;  Service: Gastroenterology;  Laterality: N/A;   ESOPHAGOGASTRODUODENOSCOPY (EGD) WITH PROPOFOL N/A 09/25/2016   Procedure: ESOPHAGOGASTRODUODENOSCOPY (EGD) WITH PROPOFOL;  Surgeon: [name], MD;  Location: [facility] ENDOSCOPY;  Service: Gastroenterology;  Laterality: N/A;   RENAL ANGIOGRAPHY N/A 02/07/2019   Procedure: RENAL ANGIOGRAPHY;  Surgeon: [name], MD;  Location: [facility] CV LAB;  Service: Cardiovascular;  Laterality: N/A;   REPLACEMENT TOTAL KNEE BILATERAL     TOTAL VAGINAL HYSTERECTOMY     ovarian mass, not cancerous   UPPER GI ENDOSCOPY  2015    No current outpatient medications on file.   No current facility-administered medications for this visit.   Facility-Administered Medications Ordered in Other Visits  Medication Dose Route Frequency Provider Last Rate Last Admin   0.9 %  sodium chloride infusion   Intravenous Continuous Lloyd Huger, MD         Allergies:   Atenolol, Codeine, Losartan, Morphine and related, Fentanyl, Nsaids, Rofecoxib, Sucralfate, Sulfa antibiotics, and Tramadol   Social History:  The patient  reports that she has never smoked. She has never used  smokeless tobacco. She reports that she does not drink alcohol and does not use drugs.   Family History:   family history includes Breast cancer (age of onset: 46) in her mother.    Review of Systems: Review of Systems  Constitutional: Negative.   HENT: Negative.    Respiratory:  Positive for shortness of breath.   Cardiovascular:  Positive for leg swelling.  Gastrointestinal: Negative.   Musculoskeletal: Negative.        Gait instability  Neurological: Negative.   Psychiatric/Behavioral: Negative.    All other systems reviewed and are negative.  PHYSICAL EXAM: VS:  There were no vitals taken for this visit. , BMI There is no height or weight on file to calculate BMI.  Constitutional:  oriented to person, place, and time. No distress.  HENT:  Head: Grossly normal Eyes:  no discharge. No scleral icterus.  Neck: No JVD, no carotid bruits  Cardiovascular: Regular rate and rhythm, no murmurs appreciated Pulmonary/Chest: Clear to auscultation bilaterally, no wheezes or rales Abdominal: Soft.  no distension.  no tenderness.  Musculoskeletal: Normal range of motion Right hand swelling, and a splint/support Neurological:  normal muscle tone. Coordination normal. No atrophy Skin: Skin warm and dry Psychiatric: normal affect, pleasant   Recent Labs: 05/22/2020: ALT 8; B Natriuretic Peptide 939.2; TSH 12.221 05/28/2020: Magnesium 1.9 06/01/2020:  Hemoglobin 10.4; Platelets 288 06/03/2020: BUN 43; Creatinine, Ser 1.17; Potassium 4.6; Sodium 142    Lipid Panel Lab Results  Component Value Date   CHOL 77 05/19/2020   HDL 29 (L) 05/19/2020   LDLCALC 36 05/19/2020   TRIG 62 05/19/2020      Wt Readings from Last 3 Encounters:  06/01/20 177 lb 3.2 oz (80.4 kg)  05/01/20 175 lb (79.4 kg)  04/23/20 179 lb 10.8 oz (81.5 kg)      ASSESSMENT AND PLAN:  Coronary artery disease of native artery of native heart with stable angina pectoris (HCC)  Currently with no symptoms of angina. No  further workup at this time. Continue current medication regimen.  Essential hypertension  Records reviewed from nursing home, sometimes low blood pressure In the setting of bradycardia, will need to be careful We will recommend splitting Cardura to 2 mg twice daily with hold parameters  Chronic diastolic CHF (congestive heart failure) ([place]) Appears euvolemic on today's visit, continue torsemide 20 mg daily  Paroxysmal atrial fibrillation (HCC)  High fall risk, several falls recently.  High risk of trauma.  Lives alone Not on anticoagulation Maintaining normal sinus rhythm  COPD exacerbation (HCC) Stable, Active  Acute renal failure superimposed on stage 3 chronic kidney disease, unspecified acute renal failure type ([place]) Followed by nephrology, stable renal function, underlying anemia  Anemia, unspecified type followed by hematology Dr. Grayland Ormond Periodic iron infusion  Total encounter time more than 35 minutes. Greater than 50% was spent in counseling and coordination of care with the patient.   Signed, Esmond Plants, M.D., Ph.D. 10/07/2020  [place], [place]

## 2020-10-08 ENCOUNTER — Ambulatory Visit: Payer: Medicare Other | Admitting: Cardiovascular Disease

## 2020-10-08 DIAGNOSIS — N183 Chronic kidney disease, stage 3 unspecified: Secondary | ICD-10-CM

## 2020-10-08 DIAGNOSIS — I701 Atherosclerosis of renal artery: Secondary | ICD-10-CM

## 2020-10-08 DIAGNOSIS — I2510 Atherosclerotic heart disease of native coronary artery without angina pectoris: Secondary | ICD-10-CM

## 2020-10-08 DIAGNOSIS — N189 Chronic kidney disease, unspecified: Secondary | ICD-10-CM

## 2020-10-08 DIAGNOSIS — E782 Mixed hyperlipidemia: Secondary | ICD-10-CM

## 2020-10-08 DIAGNOSIS — I10 Essential (primary) hypertension: Secondary | ICD-10-CM

## 2020-10-08 DIAGNOSIS — I480 Paroxysmal atrial fibrillation: Secondary | ICD-10-CM

## 2020-10-08 DIAGNOSIS — I5032 Chronic diastolic (congestive) heart failure: Secondary | ICD-10-CM

## 2020-10-08 DIAGNOSIS — D649 Anemia, unspecified: Secondary | ICD-10-CM

## 2021-12-23 ENCOUNTER — Encounter (INDEPENDENT_AMBULATORY_CARE_PROVIDER_SITE_OTHER): Payer: Self-pay
